# Patient Record
Sex: Male | Born: 1935 | Race: White | Hispanic: No | Marital: Married | Smoking: Former smoker
Health system: Southern US, Community
[De-identification: ages and names within clinical notes are randomized; demographics above are authoritative.]

## PROBLEM LIST (undated history)

## (undated) DIAGNOSIS — I499 Cardiac arrhythmia, unspecified: Secondary | ICD-10-CM

## (undated) DIAGNOSIS — I209 Angina pectoris, unspecified: Secondary | ICD-10-CM

## (undated) DIAGNOSIS — K222 Esophageal obstruction: Secondary | ICD-10-CM

## (undated) DIAGNOSIS — K602 Anal fissure, unspecified: Secondary | ICD-10-CM

## (undated) DIAGNOSIS — E785 Hyperlipidemia, unspecified: Secondary | ICD-10-CM

## (undated) DIAGNOSIS — E039 Hypothyroidism, unspecified: Secondary | ICD-10-CM

## (undated) DIAGNOSIS — R0600 Dyspnea, unspecified: Secondary | ICD-10-CM

## (undated) DIAGNOSIS — K279 Peptic ulcer, site unspecified, unspecified as acute or chronic, without hemorrhage or perforation: Secondary | ICD-10-CM

## (undated) DIAGNOSIS — C801 Malignant (primary) neoplasm, unspecified: Secondary | ICD-10-CM

## (undated) DIAGNOSIS — H409 Unspecified glaucoma: Secondary | ICD-10-CM

## (undated) DIAGNOSIS — D693 Immune thrombocytopenic purpura: Secondary | ICD-10-CM

## (undated) DIAGNOSIS — I10 Essential (primary) hypertension: Secondary | ICD-10-CM

## (undated) DIAGNOSIS — D869 Sarcoidosis, unspecified: Secondary | ICD-10-CM

## (undated) DIAGNOSIS — K219 Gastro-esophageal reflux disease without esophagitis: Secondary | ICD-10-CM

## (undated) DIAGNOSIS — H3530 Unspecified macular degeneration: Secondary | ICD-10-CM

## (undated) DIAGNOSIS — I509 Heart failure, unspecified: Secondary | ICD-10-CM

## (undated) DIAGNOSIS — I2510 Atherosclerotic heart disease of native coronary artery without angina pectoris: Secondary | ICD-10-CM

## (undated) DIAGNOSIS — K449 Diaphragmatic hernia without obstruction or gangrene: Secondary | ICD-10-CM

## (undated) DIAGNOSIS — N200 Calculus of kidney: Secondary | ICD-10-CM

## (undated) HISTORY — DX: Peptic ulcer, site unspecified, unspecified as acute or chronic, without hemorrhage or perforation: K27.9

## (undated) HISTORY — DX: Unspecified glaucoma: H40.9

## (undated) HISTORY — DX: Immune thrombocytopenic purpura: D69.3

## (undated) HISTORY — DX: Esophageal obstruction: K22.2

## (undated) HISTORY — PX: CORONARY ANGIOPLASTY WITH STENT PLACEMENT: SHX49

## (undated) HISTORY — DX: Anal fissure, unspecified: K60.2

## (undated) HISTORY — DX: Diaphragmatic hernia without obstruction or gangrene: K44.9

## (undated) HISTORY — PX: MEDIASTINOSCOPY: SUR861

## (undated) HISTORY — DX: Calculus of kidney: N20.0

## (undated) HISTORY — DX: Atherosclerotic heart disease of native coronary artery without angina pectoris: I25.10

## (undated) HISTORY — PX: OTHER SURGICAL HISTORY: SHX169

## (undated) HISTORY — DX: Sarcoidosis, unspecified: D86.9

---

## 1998-10-12 ENCOUNTER — Inpatient Hospital Stay (HOSPITAL_COMMUNITY): Admission: AD | Admit: 1998-10-12 | Discharge: 1998-10-15 | Payer: Self-pay | Admitting: Cardiovascular Disease

## 1998-10-12 ENCOUNTER — Encounter: Payer: Self-pay | Admitting: Cardiovascular Disease

## 1998-10-13 ENCOUNTER — Encounter: Payer: Self-pay | Admitting: Cardiovascular Disease

## 2000-05-06 DIAGNOSIS — D693 Immune thrombocytopenic purpura: Secondary | ICD-10-CM

## 2000-05-06 HISTORY — DX: Immune thrombocytopenic purpura: D69.3

## 2000-11-14 ENCOUNTER — Emergency Department (HOSPITAL_COMMUNITY): Admission: EM | Admit: 2000-11-14 | Discharge: 2000-11-15 | Payer: Self-pay | Admitting: Emergency Medicine

## 2000-11-14 ENCOUNTER — Encounter: Payer: Self-pay | Admitting: Emergency Medicine

## 2000-12-15 ENCOUNTER — Encounter: Payer: Self-pay | Admitting: Emergency Medicine

## 2000-12-15 ENCOUNTER — Inpatient Hospital Stay (HOSPITAL_COMMUNITY): Admission: EM | Admit: 2000-12-15 | Discharge: 2000-12-16 | Payer: Self-pay | Admitting: Emergency Medicine

## 2000-12-16 ENCOUNTER — Encounter: Payer: Self-pay | Admitting: *Deleted

## 2001-05-05 ENCOUNTER — Encounter (INDEPENDENT_AMBULATORY_CARE_PROVIDER_SITE_OTHER): Payer: Self-pay | Admitting: Specialist

## 2001-05-05 ENCOUNTER — Other Ambulatory Visit: Admission: RE | Admit: 2001-05-05 | Discharge: 2001-05-05 | Payer: Self-pay | Admitting: Oncology

## 2001-05-05 ENCOUNTER — Inpatient Hospital Stay (HOSPITAL_COMMUNITY): Admission: EM | Admit: 2001-05-05 | Discharge: 2001-05-07 | Payer: Self-pay | Admitting: Oncology

## 2001-05-06 ENCOUNTER — Encounter: Payer: Self-pay | Admitting: Oncology

## 2001-05-13 ENCOUNTER — Encounter (HOSPITAL_COMMUNITY): Admission: RE | Admit: 2001-05-13 | Discharge: 2001-06-12 | Payer: Self-pay | Admitting: Oncology

## 2001-05-13 ENCOUNTER — Encounter: Admission: RE | Admit: 2001-05-13 | Discharge: 2001-05-13 | Payer: Self-pay | Admitting: Oncology

## 2001-05-18 ENCOUNTER — Encounter (HOSPITAL_COMMUNITY): Payer: Self-pay | Admitting: Oncology

## 2001-06-15 ENCOUNTER — Encounter (HOSPITAL_COMMUNITY): Admission: RE | Admit: 2001-06-15 | Discharge: 2001-07-15 | Payer: Self-pay | Admitting: Oncology

## 2001-06-15 ENCOUNTER — Encounter: Admission: RE | Admit: 2001-06-15 | Discharge: 2001-06-15 | Payer: Self-pay | Admitting: Oncology

## 2001-07-15 ENCOUNTER — Encounter: Admission: RE | Admit: 2001-07-15 | Discharge: 2001-07-15 | Payer: Self-pay | Admitting: Oncology

## 2001-07-15 ENCOUNTER — Encounter (HOSPITAL_COMMUNITY): Admission: RE | Admit: 2001-07-15 | Discharge: 2001-08-14 | Payer: Self-pay | Admitting: Oncology

## 2001-08-26 ENCOUNTER — Encounter (HOSPITAL_COMMUNITY): Admission: RE | Admit: 2001-08-26 | Discharge: 2001-09-25 | Payer: Self-pay | Admitting: Oncology

## 2001-08-26 ENCOUNTER — Encounter: Admission: RE | Admit: 2001-08-26 | Discharge: 2001-08-26 | Payer: Self-pay | Admitting: Oncology

## 2001-09-30 ENCOUNTER — Encounter: Payer: Self-pay | Admitting: Family Medicine

## 2001-09-30 ENCOUNTER — Ambulatory Visit (HOSPITAL_COMMUNITY): Admission: RE | Admit: 2001-09-30 | Discharge: 2001-09-30 | Payer: Self-pay | Admitting: Family Medicine

## 2001-10-07 ENCOUNTER — Encounter: Admission: RE | Admit: 2001-10-07 | Discharge: 2001-10-07 | Payer: Self-pay | Admitting: Oncology

## 2001-10-07 ENCOUNTER — Encounter (HOSPITAL_COMMUNITY): Admission: RE | Admit: 2001-10-07 | Discharge: 2001-11-06 | Payer: Self-pay | Admitting: Oncology

## 2001-11-18 ENCOUNTER — Encounter: Admission: RE | Admit: 2001-11-18 | Discharge: 2001-11-18 | Payer: Self-pay | Admitting: Oncology

## 2001-11-18 ENCOUNTER — Encounter (HOSPITAL_COMMUNITY): Admission: RE | Admit: 2001-11-18 | Discharge: 2001-12-18 | Payer: Self-pay | Admitting: Oncology

## 2001-12-30 ENCOUNTER — Encounter (HOSPITAL_COMMUNITY): Admission: RE | Admit: 2001-12-30 | Discharge: 2002-01-29 | Payer: Self-pay | Admitting: Oncology

## 2001-12-30 ENCOUNTER — Encounter: Admission: RE | Admit: 2001-12-30 | Discharge: 2001-12-30 | Payer: Self-pay | Admitting: Oncology

## 2002-02-11 ENCOUNTER — Encounter (HOSPITAL_COMMUNITY): Admission: RE | Admit: 2002-02-11 | Discharge: 2002-03-13 | Payer: Self-pay | Admitting: Oncology

## 2002-03-15 ENCOUNTER — Encounter: Admission: RE | Admit: 2002-03-15 | Discharge: 2002-03-15 | Payer: Self-pay | Admitting: Oncology

## 2002-03-15 ENCOUNTER — Encounter (HOSPITAL_COMMUNITY): Admission: RE | Admit: 2002-03-15 | Discharge: 2002-04-14 | Payer: Self-pay | Admitting: Oncology

## 2002-03-24 ENCOUNTER — Other Ambulatory Visit: Admission: RE | Admit: 2002-03-24 | Discharge: 2002-03-24 | Payer: Self-pay | Admitting: Dermatology

## 2002-05-10 ENCOUNTER — Encounter (HOSPITAL_COMMUNITY): Admission: RE | Admit: 2002-05-10 | Discharge: 2002-06-09 | Payer: Self-pay | Admitting: Oncology

## 2002-05-10 ENCOUNTER — Encounter: Admission: RE | Admit: 2002-05-10 | Discharge: 2002-05-10 | Payer: Self-pay | Admitting: Oncology

## 2002-07-09 ENCOUNTER — Encounter: Admission: RE | Admit: 2002-07-09 | Discharge: 2002-07-09 | Payer: Self-pay | Admitting: Oncology

## 2002-07-09 ENCOUNTER — Encounter (HOSPITAL_COMMUNITY): Admission: RE | Admit: 2002-07-09 | Discharge: 2002-08-08 | Payer: Self-pay | Admitting: Oncology

## 2002-09-03 ENCOUNTER — Encounter (HOSPITAL_COMMUNITY): Admission: RE | Admit: 2002-09-03 | Discharge: 2002-10-03 | Payer: Self-pay | Admitting: Oncology

## 2002-09-03 ENCOUNTER — Encounter: Admission: RE | Admit: 2002-09-03 | Discharge: 2002-09-03 | Payer: Self-pay | Admitting: Oncology

## 2002-10-26 ENCOUNTER — Ambulatory Visit (HOSPITAL_COMMUNITY): Admission: RE | Admit: 2002-10-26 | Discharge: 2002-10-26 | Payer: Self-pay | Admitting: Family Medicine

## 2002-10-26 ENCOUNTER — Encounter: Payer: Self-pay | Admitting: Family Medicine

## 2002-10-29 ENCOUNTER — Encounter: Admission: RE | Admit: 2002-10-29 | Discharge: 2002-10-29 | Payer: Self-pay | Admitting: Oncology

## 2002-10-29 ENCOUNTER — Encounter (HOSPITAL_COMMUNITY): Admission: RE | Admit: 2002-10-29 | Discharge: 2002-11-28 | Payer: Self-pay | Admitting: Oncology

## 2002-11-15 ENCOUNTER — Encounter: Payer: Self-pay | Admitting: Internal Medicine

## 2002-11-15 ENCOUNTER — Ambulatory Visit (HOSPITAL_COMMUNITY): Admission: RE | Admit: 2002-11-15 | Discharge: 2002-11-15 | Payer: Self-pay | Admitting: Internal Medicine

## 2002-12-23 ENCOUNTER — Ambulatory Visit (HOSPITAL_COMMUNITY): Admission: RE | Admit: 2002-12-23 | Discharge: 2002-12-23 | Payer: Self-pay | Admitting: Family Medicine

## 2002-12-23 ENCOUNTER — Encounter: Payer: Self-pay | Admitting: Family Medicine

## 2003-02-22 ENCOUNTER — Encounter (HOSPITAL_COMMUNITY): Admission: RE | Admit: 2003-02-22 | Discharge: 2003-03-24 | Payer: Self-pay | Admitting: Oncology

## 2003-02-22 ENCOUNTER — Encounter: Admission: RE | Admit: 2003-02-22 | Discharge: 2003-02-22 | Payer: Self-pay | Admitting: Oncology

## 2003-04-06 ENCOUNTER — Ambulatory Visit (HOSPITAL_COMMUNITY): Admission: RE | Admit: 2003-04-06 | Discharge: 2003-04-06 | Payer: Self-pay | Admitting: Family Medicine

## 2003-05-20 ENCOUNTER — Ambulatory Visit (HOSPITAL_COMMUNITY): Admission: RE | Admit: 2003-05-20 | Discharge: 2003-05-20 | Payer: Self-pay | Admitting: Family Medicine

## 2003-05-25 ENCOUNTER — Encounter (HOSPITAL_COMMUNITY): Admission: RE | Admit: 2003-05-25 | Discharge: 2003-06-24 | Payer: Self-pay | Admitting: Oncology

## 2003-05-25 ENCOUNTER — Encounter: Admission: RE | Admit: 2003-05-25 | Discharge: 2003-05-25 | Payer: Self-pay | Admitting: Oncology

## 2003-06-02 ENCOUNTER — Ambulatory Visit (HOSPITAL_COMMUNITY): Admission: RE | Admit: 2003-06-02 | Discharge: 2003-06-02 | Payer: Self-pay | Admitting: Internal Medicine

## 2003-07-14 ENCOUNTER — Other Ambulatory Visit: Admission: RE | Admit: 2003-07-14 | Discharge: 2003-07-14 | Payer: Self-pay | Admitting: Dermatology

## 2003-11-23 ENCOUNTER — Encounter (HOSPITAL_COMMUNITY): Admission: RE | Admit: 2003-11-23 | Discharge: 2003-12-23 | Payer: Self-pay | Admitting: Oncology

## 2003-11-23 ENCOUNTER — Encounter: Admission: RE | Admit: 2003-11-23 | Discharge: 2003-11-23 | Payer: Self-pay | Admitting: Oncology

## 2004-04-23 ENCOUNTER — Ambulatory Visit (HOSPITAL_COMMUNITY): Admission: RE | Admit: 2004-04-23 | Discharge: 2004-04-23 | Payer: Self-pay | Admitting: Internal Medicine

## 2004-05-23 ENCOUNTER — Encounter (HOSPITAL_COMMUNITY): Admission: RE | Admit: 2004-05-23 | Discharge: 2004-06-22 | Payer: Self-pay | Admitting: Oncology

## 2004-05-23 ENCOUNTER — Encounter: Admission: RE | Admit: 2004-05-23 | Discharge: 2004-05-23 | Payer: Self-pay | Admitting: Oncology

## 2004-05-23 ENCOUNTER — Ambulatory Visit (HOSPITAL_COMMUNITY): Payer: Self-pay | Admitting: Oncology

## 2004-06-14 ENCOUNTER — Ambulatory Visit: Payer: Self-pay | Admitting: Internal Medicine

## 2004-06-19 ENCOUNTER — Ambulatory Visit (HOSPITAL_COMMUNITY): Admission: RE | Admit: 2004-06-19 | Discharge: 2004-06-19 | Payer: Self-pay | Admitting: Internal Medicine

## 2004-06-19 ENCOUNTER — Ambulatory Visit: Payer: Self-pay | Admitting: Internal Medicine

## 2004-06-19 HISTORY — PX: ESOPHAGOGASTRODUODENOSCOPY: SHX1529

## 2004-07-20 ENCOUNTER — Ambulatory Visit: Payer: Self-pay | Admitting: Internal Medicine

## 2004-08-20 ENCOUNTER — Ambulatory Visit: Payer: Self-pay | Admitting: Internal Medicine

## 2004-11-16 ENCOUNTER — Encounter (HOSPITAL_COMMUNITY): Admission: RE | Admit: 2004-11-16 | Discharge: 2004-12-16 | Payer: Self-pay | Admitting: Oncology

## 2004-11-16 ENCOUNTER — Ambulatory Visit (HOSPITAL_COMMUNITY): Payer: Self-pay | Admitting: Oncology

## 2004-11-16 ENCOUNTER — Encounter: Admission: RE | Admit: 2004-11-16 | Discharge: 2004-11-16 | Payer: Self-pay | Admitting: Oncology

## 2004-11-21 ENCOUNTER — Ambulatory Visit: Payer: Self-pay | Admitting: Internal Medicine

## 2005-02-18 ENCOUNTER — Ambulatory Visit: Payer: Self-pay | Admitting: Internal Medicine

## 2005-04-02 ENCOUNTER — Ambulatory Visit: Payer: Self-pay | Admitting: Internal Medicine

## 2005-05-13 ENCOUNTER — Other Ambulatory Visit: Admission: RE | Admit: 2005-05-13 | Discharge: 2005-05-13 | Payer: Self-pay | Admitting: Dermatology

## 2005-05-21 ENCOUNTER — Ambulatory Visit (HOSPITAL_COMMUNITY): Payer: Self-pay | Admitting: Oncology

## 2005-05-21 ENCOUNTER — Encounter: Admission: RE | Admit: 2005-05-21 | Discharge: 2005-05-21 | Payer: Self-pay | Admitting: Oncology

## 2005-05-21 ENCOUNTER — Encounter (HOSPITAL_COMMUNITY): Admission: RE | Admit: 2005-05-21 | Discharge: 2005-06-20 | Payer: Self-pay | Admitting: Oncology

## 2005-05-24 ENCOUNTER — Ambulatory Visit: Payer: Self-pay | Admitting: Internal Medicine

## 2005-07-29 ENCOUNTER — Ambulatory Visit: Payer: Self-pay | Admitting: Internal Medicine

## 2005-08-22 ENCOUNTER — Ambulatory Visit: Payer: Self-pay | Admitting: Internal Medicine

## 2005-11-19 ENCOUNTER — Encounter (HOSPITAL_COMMUNITY): Admission: RE | Admit: 2005-11-19 | Discharge: 2005-12-19 | Payer: Self-pay | Admitting: Oncology

## 2005-11-19 ENCOUNTER — Encounter: Admission: RE | Admit: 2005-11-19 | Discharge: 2005-11-19 | Payer: Self-pay | Admitting: Oncology

## 2005-11-19 ENCOUNTER — Ambulatory Visit (HOSPITAL_COMMUNITY): Payer: Self-pay | Admitting: Oncology

## 2005-12-10 ENCOUNTER — Ambulatory Visit: Payer: Self-pay | Admitting: Internal Medicine

## 2006-02-20 ENCOUNTER — Ambulatory Visit: Payer: Self-pay | Admitting: Internal Medicine

## 2006-04-18 ENCOUNTER — Ambulatory Visit: Payer: Self-pay | Admitting: Internal Medicine

## 2006-05-19 ENCOUNTER — Ambulatory Visit (HOSPITAL_COMMUNITY): Payer: Self-pay | Admitting: Oncology

## 2006-05-19 ENCOUNTER — Encounter (HOSPITAL_COMMUNITY): Admission: RE | Admit: 2006-05-19 | Discharge: 2006-06-18 | Payer: Self-pay | Admitting: Oncology

## 2006-08-19 ENCOUNTER — Ambulatory Visit: Payer: Self-pay | Admitting: Internal Medicine

## 2006-09-02 ENCOUNTER — Ambulatory Visit: Payer: Self-pay | Admitting: Internal Medicine

## 2006-10-24 ENCOUNTER — Ambulatory Visit: Payer: Self-pay | Admitting: Internal Medicine

## 2006-10-30 ENCOUNTER — Ambulatory Visit: Payer: Self-pay | Admitting: Internal Medicine

## 2006-10-30 ENCOUNTER — Ambulatory Visit (HOSPITAL_COMMUNITY): Admission: RE | Admit: 2006-10-30 | Discharge: 2006-10-30 | Payer: Self-pay | Admitting: Internal Medicine

## 2006-10-30 HISTORY — PX: COLONOSCOPY: SHX174

## 2006-10-30 HISTORY — PX: ESOPHAGOGASTRODUODENOSCOPY: SHX1529

## 2006-11-19 ENCOUNTER — Ambulatory Visit (HOSPITAL_COMMUNITY): Payer: Self-pay | Admitting: Oncology

## 2006-11-19 ENCOUNTER — Encounter (HOSPITAL_COMMUNITY): Admission: RE | Admit: 2006-11-19 | Discharge: 2006-12-19 | Payer: Self-pay | Admitting: Oncology

## 2006-12-23 ENCOUNTER — Ambulatory Visit: Payer: Self-pay | Admitting: Internal Medicine

## 2007-04-22 ENCOUNTER — Ambulatory Visit: Payer: Self-pay | Admitting: Internal Medicine

## 2007-05-18 ENCOUNTER — Ambulatory Visit (HOSPITAL_COMMUNITY): Payer: Self-pay | Admitting: Oncology

## 2007-05-18 ENCOUNTER — Encounter (HOSPITAL_COMMUNITY): Admission: RE | Admit: 2007-05-18 | Discharge: 2007-06-17 | Payer: Self-pay | Admitting: Oncology

## 2007-05-20 ENCOUNTER — Encounter: Payer: Self-pay | Admitting: Internal Medicine

## 2007-08-24 ENCOUNTER — Ambulatory Visit (HOSPITAL_COMMUNITY): Admission: RE | Admit: 2007-08-24 | Discharge: 2007-08-24 | Payer: Self-pay | Admitting: Family Medicine

## 2007-08-27 ENCOUNTER — Ambulatory Visit: Payer: Self-pay | Admitting: Internal Medicine

## 2007-08-27 DIAGNOSIS — I2510 Atherosclerotic heart disease of native coronary artery without angina pectoris: Secondary | ICD-10-CM | POA: Insufficient documentation

## 2007-08-27 DIAGNOSIS — D869 Sarcoidosis, unspecified: Secondary | ICD-10-CM | POA: Insufficient documentation

## 2007-08-27 DIAGNOSIS — J302 Other seasonal allergic rhinitis: Secondary | ICD-10-CM | POA: Insufficient documentation

## 2007-08-27 DIAGNOSIS — J4520 Mild intermittent asthma, uncomplicated: Secondary | ICD-10-CM | POA: Insufficient documentation

## 2007-08-27 DIAGNOSIS — J3089 Other allergic rhinitis: Secondary | ICD-10-CM

## 2007-09-14 ENCOUNTER — Ambulatory Visit: Payer: Self-pay | Admitting: Internal Medicine

## 2007-11-13 ENCOUNTER — Inpatient Hospital Stay (HOSPITAL_COMMUNITY): Admission: EM | Admit: 2007-11-13 | Discharge: 2007-11-19 | Payer: Self-pay | Admitting: Emergency Medicine

## 2007-11-25 ENCOUNTER — Ambulatory Visit (HOSPITAL_COMMUNITY): Admission: AD | Admit: 2007-11-25 | Discharge: 2007-11-26 | Payer: Self-pay | Admitting: Cardiology

## 2008-01-26 ENCOUNTER — Ambulatory Visit: Payer: Self-pay | Admitting: Internal Medicine

## 2008-02-26 ENCOUNTER — Ambulatory Visit: Payer: Self-pay | Admitting: Internal Medicine

## 2008-05-09 ENCOUNTER — Encounter (HOSPITAL_COMMUNITY): Admission: RE | Admit: 2008-05-09 | Discharge: 2008-06-08 | Payer: Self-pay | Admitting: Oncology

## 2008-05-20 ENCOUNTER — Ambulatory Visit (HOSPITAL_COMMUNITY): Payer: Self-pay | Admitting: Oncology

## 2008-05-31 ENCOUNTER — Ambulatory Visit: Payer: Self-pay | Admitting: Internal Medicine

## 2008-07-27 ENCOUNTER — Telehealth: Payer: Self-pay | Admitting: Internal Medicine

## 2008-08-24 ENCOUNTER — Ambulatory Visit: Payer: Self-pay | Admitting: Internal Medicine

## 2008-10-18 ENCOUNTER — Ambulatory Visit: Payer: Self-pay | Admitting: Internal Medicine

## 2009-01-18 DIAGNOSIS — I509 Heart failure, unspecified: Secondary | ICD-10-CM | POA: Insufficient documentation

## 2009-01-18 DIAGNOSIS — I10 Essential (primary) hypertension: Secondary | ICD-10-CM | POA: Insufficient documentation

## 2009-01-18 DIAGNOSIS — K222 Esophageal obstruction: Secondary | ICD-10-CM | POA: Insufficient documentation

## 2009-01-18 DIAGNOSIS — E7800 Pure hypercholesterolemia, unspecified: Secondary | ICD-10-CM | POA: Insufficient documentation

## 2009-01-18 DIAGNOSIS — E039 Hypothyroidism, unspecified: Secondary | ICD-10-CM | POA: Insufficient documentation

## 2009-01-18 DIAGNOSIS — H409 Unspecified glaucoma: Secondary | ICD-10-CM | POA: Insufficient documentation

## 2009-01-18 DIAGNOSIS — K602 Anal fissure, unspecified: Secondary | ICD-10-CM | POA: Insufficient documentation

## 2009-01-18 DIAGNOSIS — N200 Calculus of kidney: Secondary | ICD-10-CM | POA: Insufficient documentation

## 2009-01-18 DIAGNOSIS — Z8711 Personal history of peptic ulcer disease: Secondary | ICD-10-CM | POA: Insufficient documentation

## 2009-01-19 ENCOUNTER — Ambulatory Visit: Payer: Self-pay | Admitting: Internal Medicine

## 2009-01-19 DIAGNOSIS — K219 Gastro-esophageal reflux disease without esophagitis: Secondary | ICD-10-CM | POA: Insufficient documentation

## 2009-01-19 DIAGNOSIS — K921 Melena: Secondary | ICD-10-CM | POA: Insufficient documentation

## 2009-01-19 DIAGNOSIS — R1310 Dysphagia, unspecified: Secondary | ICD-10-CM | POA: Insufficient documentation

## 2009-01-20 ENCOUNTER — Ambulatory Visit (HOSPITAL_COMMUNITY): Admission: RE | Admit: 2009-01-20 | Discharge: 2009-01-20 | Payer: Self-pay | Admitting: Internal Medicine

## 2009-03-15 ENCOUNTER — Encounter: Payer: Self-pay | Admitting: Gastroenterology

## 2009-03-22 ENCOUNTER — Ambulatory Visit: Payer: Self-pay | Admitting: Internal Medicine

## 2009-04-04 ENCOUNTER — Ambulatory Visit (HOSPITAL_COMMUNITY): Admission: RE | Admit: 2009-04-04 | Discharge: 2009-04-04 | Payer: Self-pay | Admitting: Internal Medicine

## 2009-05-19 ENCOUNTER — Ambulatory Visit (HOSPITAL_COMMUNITY): Payer: Self-pay | Admitting: Oncology

## 2009-05-19 ENCOUNTER — Encounter (HOSPITAL_COMMUNITY): Admission: RE | Admit: 2009-05-19 | Discharge: 2009-06-18 | Payer: Self-pay | Admitting: Oncology

## 2009-08-01 ENCOUNTER — Ambulatory Visit: Payer: Self-pay | Admitting: Internal Medicine

## 2009-09-05 ENCOUNTER — Inpatient Hospital Stay (HOSPITAL_COMMUNITY): Admission: EM | Admit: 2009-09-05 | Discharge: 2009-09-06 | Payer: Self-pay | Admitting: Emergency Medicine

## 2009-09-21 ENCOUNTER — Ambulatory Visit: Payer: Self-pay | Admitting: Internal Medicine

## 2009-12-06 ENCOUNTER — Ambulatory Visit: Payer: Self-pay | Admitting: Internal Medicine

## 2010-05-02 ENCOUNTER — Ambulatory Visit: Payer: Self-pay | Admitting: Internal Medicine

## 2010-05-08 ENCOUNTER — Ambulatory Visit (HOSPITAL_COMMUNITY)
Admission: RE | Admit: 2010-05-08 | Discharge: 2010-06-05 | Payer: Self-pay | Source: Home / Self Care | Attending: Oncology | Admitting: Oncology

## 2010-05-08 ENCOUNTER — Encounter (HOSPITAL_COMMUNITY)
Admission: RE | Admit: 2010-05-08 | Discharge: 2010-06-05 | Payer: Self-pay | Source: Home / Self Care | Attending: Oncology | Admitting: Oncology

## 2010-05-11 ENCOUNTER — Ambulatory Visit (HOSPITAL_COMMUNITY)
Admission: RE | Admit: 2010-05-11 | Discharge: 2010-06-05 | Payer: Self-pay | Source: Home / Self Care | Attending: Oncology | Admitting: Oncology

## 2010-06-05 NOTE — Assessment & Plan Note (Signed)
Summary: sinus problem/ mbw   Primary Provider/Referring Provider:  McGough  CC:  Accute visit-sins drainage-sore throat x 2-3 days.  History of Present Illness: From 08/27/07-  Mr. Crystal returns for his follow-up of sarcoid and COPD.  He's been bothered by persistent low-grade bronchitis, coughing, brown sputum.  He says it won't come loose.  His primary doctor gave a Z-Pak and Singulair earlier this letter.  He's been coughing more over the last two weeks, but denies fever, or chest pain.  Prednisone helped in January, but caused a lot of trouble with thrush.  He's been taking Mucinex.  Otherwise, he is not worsened.  He denies fever, chest pain, or sore throat now.  He says a chest x-ray in [place] on Monday was " okay.".  02/26/08-  Complains of uncomfortable substernal rubbing sensation since stent placed in July. denies routine cough, fever, sweat, chest pain, palpitaiton or dysphagia. Got flu vax.  08/24/08- Sarcoid, allergic rhiniits, asthma 6 month f/u. CXR in Oct had shown stable chronic scarring.  Pollen gives some nasal burning and rhinorhea. Using breath right strips. Occ choke or strangle with meals. Noted easy bruising while working with chain saw recently.  Sep 21, 2009- Sarcoid, allergic rhinitis, asthma Hosp [facility] first of May with chest congestion . They adjusted meds, causing feet to swell. Now back to normal..Had f/u w/ cardiology. Now complains of drainage sensation in his throat for several years. Mucus is white spit. In last 2 days notes more cough, sore throat, no fever. No more dyspneic than usual. Continues allergy vaccine. Note he is on timoptic, without wheeze.     Current Medications (verified): 1)  Isosorbide Mononitrate Cr 60 Mg  Tb24 (Isosorbide Mononitrate) .... Take 1 1/2 Tabs By Mouth Once Daily 2)  Synthroid 100 Mcg  Tabs (Levothyroxine Sodium) .... Take 1 Tablet By Mouth Once A Day 3)  Allergy Injections 1:10 .... @ Dr.  Edison Simon 4)  Multivitamins    Tabs (Multiple Vitamin) .... Take 1 Tablet By Mouth Once A Day 5)  Finasteride 5 Mg  Tabs (Finasteride) .... Take 1 Tablet By Mouth Once A Day 6)  Zetia 10 Mg  Tabs (Ezetimibe) .... Take 1 Tablet By Mouth Once A Day 7)  Cozaar 100 Mg Tabs (Losartan Potassium) .... Take 1 By Mouth Once Daily 8)  Plavix 75 Mg Tabs (Clopidogrel Bisulfate) .... Take 1 By Mouth Once Daily 9)  Crestor 5 Mg Tabs (Rosuvastatin Calcium) .... Take 1 By Mouth Once Daily 10)  Toprol Xl 25 Mg Xr24h-Tab (Metoprolol Succinate) .... Take 1 By Mouth Once Daily 11)  Travatan 0.004 % Soln (Travoprost) .... Use As Directed 12)  Mucinex 600 Mg Xr12h-Tab (Guaifenesin) .... Two Times A Day 13)  Vitamin C 500 Mg Tabs (Ascorbic Acid) .... Take 1 By Mouth Once Daily 14)  Vitamin E 200 Unit Caps (Vitamin E) .... Take 1 By Mouth Once Daily 15)  Aspirin 81 Mg Tabs (Aspirin) .... Once Daily 16)  Triamcinolone Acetonide 0.1 % Crea (Triamcinolone Acetonide) .... As Needed 17)  Hydrochlorothiazide 12.5 Mg Caps (Hydrochlorothiazide) .... Take One Tablet Mon, Wed, Fri 18)  Timoptic 0.5 % Soln (Timolol Maleate) .Marland Kitchen.. 1 Drop in Both Eyes Every Morning 19)  Alprazolam 0.5 Mg Tabs (Alprazolam) .... Take 1/2 By Mouth At Bedtime 20)  Protonix 40 Mg Tbec (Pantoprazole Sodium) .... Take 1 By Mouth Once Daily 21)  Klor-Con 20 Meq Pack (Potassium Chloride) .... Take 1 By Mouth Once Daily 22)  Norvasc 5 Mg Tabs (  Amlodipine Besylate) .... Take 1 By Mouth Once Daily  Allergies (verified): 1)  ! Penicillin 2)  ! Sulfa 3)  ! Morphine 4)  ! Doxazosin Mesylate (Doxazosin Mesylate) 5)  ! * Tussin 6)  ! Robitussin Maximum Strength (Dextromethorphan Hbr) 7)  ! Lopressor 8)  ! Cardizem 9)  ! Anacin (Aspirin) 10)  ! Soma 11)  ! Cytotec (Misoprostol) 12)  ! Imdur 13)  ! Tylenol 14)  ! Dimetapp Cold/allergy (Brompheniramine-Phenylephrine) 15)  ! Dristan Cold (Chlorphen-Phenyleph-Apap) 16)  ! Tenormin 17)  ! Procardia  Past History:  Past Medical  History: Last updated: 02/26/2008 CORONARY ARTERY DISEASE (ICD-414.00) * IDIOPATHIC THROMBOCYTOPENIC PURPURA ALLERGIC ASTHMA (ICD-493.00) ALLERGIC RHINITIS (ICD-477.9) SARCOIDOSIS (ICD-135)    Past Surgical History: Last updated: 02/26/2008 Mediastinoscopy for dx sarcoid Cardiac stent  Family History: Last updated: 08/30/08 Father- died heart disease Mother- died CVA  Social History: Last updated: 2008-08-30 Patient never smoked.  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  The patient denies shortness of breath at rest, non-productive cough, coughing up blood, chest pain, irregular heartbeats, acid heartburn, indigestion, loss of appetite, weight change, abdominal pain, difficulty swallowing, sore throat, tooth/dental problems, nasal congestion/difficulty breathing through nose, and sneezing.    Vital Signs:  Patient profile:   77 year old male Height:      70 inches Weight:      153 pounds BMI:     22.03 O2 Sat:      99 % on Room air Pulse rate:   70 / minute BP sitting:   118 / 62  (left arm) Cuff size:   regular  Vitals Entered By: Reynaldo Minium CMA (Sep 21, 2009 9:39 AM)  O2 Flow:  Room air  Physical Exam  Additional Exam:  General: A/Ox3; pleasant and cooperative, NAD, SKIN: no rash, lesions NODES: no lymphadenopathy HEENT: [place]/AT, EOM- WNL, Conjuctivae- clear, PERRLA, TM-right retracted and paper-white area= foreign material or scar at inferior limb on right., Nose- clear, Throat- clear and wnl NECK: Supple w/ fair ROM, JVD- none, normal carotid impulses w/o bruits Thyroid-  CHEST: Clear to P&A,  HEART: RRR, no m/g/r heard ABDOMEN: Soft UUV:OZDG, nl pulses, no edema  NEURO: Grossly intact to observation      Impression & Recommendations:  Problem # 1:  SARCOIDOSIS, PULMONARY  (ICD-135) Clinically stable without complaint today.  Problem # 2:  ALLERGIC RHINITIS (ICD-477.9)  Complains of rhinitis with postnasal drip and symptoms of eustachian dysfunction. We will try neb nasal with depo.  Medications Added to Medication List This Visit: 1)  Timoptic 0.5 % Soln (Timolol maleate) .Marland Kitchen.. 1 drop in both eyes every morning 2)  Alprazolam 0.5 Mg Tabs (Alprazolam) .... Take 1/2 by mouth at bedtime 3)  Protonix 40 Mg Tbec (Pantoprazole sodium) .... Take 1 by mouth once daily 4)  Klor-con 20 Meq Pack (Potassium chloride) .... Take 1 by mouth once daily 5)  Norvasc 5 Mg Tabs (Amlodipine besylate) .... Take 1 by mouth once daily  Other Orders: Est. Patient Level III (64403) Admin of Therapeutic Inj  intramuscular or subcutaneous (47425) Depo- Medrol 80mg  (J1040) Nebulizer Tx (95638)  Patient Instructions: 1)  Please schedule a follow-up appointment in 6 months. 2)  neb nasal neo  3)  depo 80     Medication Administration  Injection # 1:    Medication: Depo- Medrol 80mg     Diagnosis: ALLERGIC RHINITIS (ICD-477.9)    Route: IM    Site: RUOQ gluteus    Exp Date: 03/18/2012    Lot #: [id number]    Mfr: Pharmacia    Patient tolerated injection without complications    Given by: Abigail Miyamoto RN (Sep 21, 2009 10:47 AM)  Medication # 1:    Medication: EMR miscellaneous medications    Diagnosis: ALLERGIC RHINITIS (ICD-477.9)    Dose: 3 drops    Route: intranasal    Exp Date: 02/14/2011    Lot #: [id number]    Mfr:  Ramond Dial    Comments: Neosynephrine     Patient tolerated medication without complications    Given by: Abigail Miyamoto RN (Sep 21, 2009 10:50 AM)  Orders Added: 1)  Est. Patient Level III [02542] 2)  Admin of Therapeutic Inj  intramuscular or subcutaneous [96372] 3)  Depo- Medrol 80mg  [J1040] 4)  Nebulizer Tx [70623]

## 2010-06-21 ENCOUNTER — Encounter: Payer: Self-pay | Admitting: Gastroenterology

## 2010-06-21 ENCOUNTER — Ambulatory Visit (INDEPENDENT_AMBULATORY_CARE_PROVIDER_SITE_OTHER): Payer: Medicare Other | Admitting: Gastroenterology

## 2010-06-21 DIAGNOSIS — R197 Diarrhea, unspecified: Secondary | ICD-10-CM

## 2010-06-26 ENCOUNTER — Encounter: Payer: Medicare Other | Admitting: Internal Medicine

## 2010-06-28 ENCOUNTER — Encounter: Payer: Self-pay | Admitting: Internal Medicine

## 2010-06-28 ENCOUNTER — Encounter (INDEPENDENT_AMBULATORY_CARE_PROVIDER_SITE_OTHER): Payer: Medicare Other | Admitting: Internal Medicine

## 2010-06-28 DIAGNOSIS — R109 Unspecified abdominal pain: Secondary | ICD-10-CM

## 2010-07-03 NOTE — Assessment & Plan Note (Signed)
Summary: ABD PAIN,DIARRHEA   Vital Signs:  Patient profile:   77 year old male Height:      70 inches Weight:      147.50 pounds BMI:     21.24 Temp:     97.8 degrees F oral Pulse rate:   68 / minute BP sitting:   158 / 78  (left arm) Cuff size:   large  Vitals Entered By: Cloria Spring LPN (June 21, 2010 2:10 PM)  Visit Type:  Follow-up Consult Primary Care Provider:  McGough  CC:  abdominal pain/diarrhea.  History of Present Illness: Dale [name] is a pleasant 77 year old male who presents at the request of Dr. Sherwood Gambler secondary to bowel habits. Pt denies diarrhea. He states he had one episode of loose stool about 2 weeks ago, self-limiting, small amount of abdominal discomfort. All resolved now. Reports a BM usually every day, sometimes 2-3X/day. Soft formed. no brbpr. He states stool was "black" one time. No other incidences. No abdominal pain. Denies nausea. No loss of appetite. No change in weight. No recent labs.  Last EGD/ED and TCS was in June 2008: showed esophageal ring s/p dilatation and nl colon.   Current Medications (verified): 1)  Isosorbide Mononitrate Cr 60 Mg  Tb24 (Isosorbide Mononitrate) .... Take 1 Tablet By Mouth Once A Day 2)  Synthroid 88 Mcg  Tabs (Levothyroxine Sodium) .... Take 1 Tablet By Mouth Once A Day 3)  Allergy Injections 1:10 .... @ Dr.  Edison Simon 4)  Multivitamins   Tabs (Multiple Vitamin) .... Take 1 Tablet By Mouth Once A Day 5)  Finasteride 5 Mg  Tabs (Finasteride) .... Take 1 Tablet By Mouth Once A Day 6)  Zetia 10 Mg  Tabs (Ezetimibe) .... Take 1 Tablet By Mouth Once A Day 7)  Cozaar 100 Mg Tabs (Losartan Potassium) .... Take 1 By Mouth Once Daily 8)  Plavix 75 Mg Tabs (Clopidogrel Bisulfate) .... Take 1 By Mouth Once Daily 9)  Crestor 5 Mg Tabs (Rosuvastatin Calcium) .... Take 1 By Mouth Once Daily 10)  Toprol Xl 25 Mg Xr24h-Tab (Metoprolol Succinate) .... Take One Half Tablet Twice Daily 11)  Travatan 0.004 % Soln (Travoprost) .... Use As  Directed 12)  Mucinex 600 Mg Xr12h-Tab (Guaifenesin) .... One Tablet Daily 13)  Vitamin C 500 Mg Tabs (Ascorbic Acid) .... Take 1 By Mouth Once Daily 14)  Vitamin E 200 Unit Caps (Vitamin E) .... Take 1 By Mouth Once Daily 15)  Aspirin 81 Mg Tabs (Aspirin) .... Once Daily 16)  Triamcinolone Acetonide 0.1 % Crea (Triamcinolone Acetonide) .... As Needed 17)  Timoptic 0.5 % Soln (Timolol Maleate) .Marland Kitchen.. 1 Drop in Both Eyes Every Morning 18)  Protonix 40 Mg Tbec (Pantoprazole Sodium) .... Take 1 By Mouth Once Daily 19)  Dorzolamide Hcl 2 % Soln (Dorzolamide Hcl) .... As Directed 20)  Preservision/lutein  Caps (Multiple Vitamins-Minerals) .... Daily  Allergies (verified): 1)  ! Penicillin 2)  ! Sulfa 3)  ! Morphine 4)  ! Doxazosin Mesylate (Doxazosin Mesylate) 5)  ! * Tussin 6)  ! Robitussin Maximum Strength (Dextromethorphan Hbr) 7)  ! Lopressor 8)  ! Cardizem 9)  ! Anacin (Aspirin) 10)  ! Soma 11)  ! Cytotec (Misoprostol) 12)  ! Imdur 13)  ! Tylenol 14)  ! Dimetapp Cold/allergy (Brompheniramine-Phenylephrine) 15)  ! Dristan Cold (Chlorphen-Pe-Acetaminophen) 16)  ! Tenormin 17)  ! Procardia  Past History:  Past Medical History: Last updated: 02/26/2008 CORONARY ARTERY DISEASE (ICD-414.00) * IDIOPATHIC THROMBOCYTOPENIC PURPURA ALLERGIC ASTHMA (  ICD-493.00) ALLERGIC RHINITIS (ICD-477.9) SARCOIDOSIS (ICD-135)    Past Surgical History: Last updated: 02/26/2008 Mediastinoscopy for dx sarcoid Cardiac stent  Family History: Last updated: 2008/09/18 Father- died heart disease Mother- died CVA  Social History: Last updated: 18-Sep-2008 Patient never smoked.  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 Eyes:  Denies blurring, irritation, and discharge. ENT:  Denies sore throat, hoarseness, and difficulty swallowing. CV:  Denies chest pains and syncope. Resp:  Denies dyspnea at rest and wheezing. GI:  See  HPI. GU:  Denies urinary burning and urinary frequency. MS:  Denies joint pain / LOM, joint swelling, and joint stiffness. Derm:  Denies rash, itching, and dry skin. Neuro:  Denies weakness and syncope. Psych:  Denies depression and anxiety. Endo:  Denies cold intolerance and heat intolerance.  Physical Exam  General:  thin but healthy-appearing Head:  Normocephalic and atraumatic. Eyes:  without icterus, sclera clear Lungs:  Clear throughout to auscultation. Heart:  Regular rate and rhythm; no murmurs, rubs,  or bruits. Abdomen:  +BS, soft, non-tender, non-distended. no HSM. No rebound or guarding, no masses noted.  Msk:  Symmetrical with no gross deformities. Normal posture. Extremities:  No clubbing, cyanosis, edema or deformities noted. Neurologic:  Alert and  oriented x4;  grossly normal neurologically.   Impression & Recommendations:  Problem # 1:  DIARRHEA (ICD-44.49) 77 year old pleasant Caucasian male with hx of 1 episode of loose stool approximately 2 weeks ago. None since. BM 2-3X per day, soft. No abdominal pain, N/V. No change in appetite or wt loss. Last TCS in 2008 normal. Does state has noted incidence of "black" stool. No recent labs on pt. Appears diarrhea may have been isolated incidence. ?melena?  Stool studies if diarrhea returns, otherwise no change in plan CBC Further rec's to follow.    Orders: T-CBC w/Diff ([id number]) Consultation Level III [phone])   Orders Added: 1)  T-CBC w/Diff [[id number]] 2)  Consultation Level III [13086]

## 2010-07-03 NOTE — Assessment & Plan Note (Signed)
Summary: dropped off stool  pt returned ifobt and it was negative  Allergies: 1)  ! Penicillin 2)  ! Sulfa 3)  ! Morphine 4)  ! Doxazosin Mesylate (Doxazosin Mesylate) 5)  ! * Tussin 6)  ! Robitussin Maximum Strength (Dextromethorphan Hbr) 7)  ! Lopressor 8)  ! Cardizem 9)  ! Anacin (Aspirin) 10)  ! Soma 11)  ! Cytotec (Misoprostol) 12)  ! Imdur 13)  ! Tylenol 14)  ! Dimetapp Cold/allergy (Brompheniramine-Phenylephrine) 15)  ! Dristan Cold (Chlorphen-Pe-Acetaminophen) 16)  ! Tenormin 17)  ! Procardia   Other Orders: Immuno-chemical Fecal Occult (04540)   Orders Added: 1)  Immuno-chemical Fecal Occult [[id number]]

## 2010-07-16 LAB — CBC
HCT: 37.1 % — ABNORMAL LOW (ref 39.0–52.0)
Hemoglobin: 12.6 g/dL — ABNORMAL LOW (ref 13.0–17.0)
MCH: 32.8 pg (ref 26.0–34.0)
MCHC: 34 g/dL (ref 30.0–36.0)
MCV: 96.6 fL (ref 78.0–100.0)
Platelets: 199 10*3/uL (ref 150–400)
RBC: 3.84 MIL/uL — ABNORMAL LOW (ref 4.22–5.81)
RDW: 13.6 % (ref 11.5–15.5)
WBC: 6 10*3/uL (ref 4.0–10.5)

## 2010-07-20 LAB — CONVERTED CEMR LAB
Basophils Absolute: 0 10*3/uL (ref 0.0–0.1)
Basophils Relative: 1 % (ref 0–1)
Eosinophils Absolute: 0.3 10*3/uL (ref 0.0–0.7)
Eosinophils Relative: 4 % (ref 0–5)
HCT: 40.2 % (ref 39.0–52.0)
Hemoglobin: 12.8 g/dL — ABNORMAL LOW (ref 13.0–17.0)
Lymphocytes Relative: 17 % (ref 12–46)
Lymphs Abs: 1.1 10*3/uL (ref 0.7–4.0)
MCHC: 31.8 g/dL (ref 30.0–36.0)
MCV: 100.2 fL — ABNORMAL HIGH (ref 78.0–100.0)
Monocytes Absolute: 1 10*3/uL (ref 0.1–1.0)
Monocytes Relative: 15 % — ABNORMAL HIGH (ref 3–12)
Neutro Abs: 4 10*3/uL (ref 1.7–7.7)
Neutrophils Relative %: 63 % (ref 43–77)
Platelets: 251 10*3/uL (ref 150–400)
RBC: 4.01 M/uL — ABNORMAL LOW (ref 4.22–5.81)
RDW: 14.2 % (ref 11.5–15.5)
WBC: 6.4 10*3/uL (ref 4.0–10.5)

## 2010-07-21 LAB — CBC
HCT: 38.6 % — ABNORMAL LOW (ref 39.0–52.0)
Hemoglobin: 13.1 g/dL (ref 13.0–17.0)
MCHC: 34 g/dL (ref 30.0–36.0)
MCV: 101.3 fL — ABNORMAL HIGH (ref 78.0–100.0)
Platelets: 230 10*3/uL (ref 150–400)
RBC: 3.81 MIL/uL — ABNORMAL LOW (ref 4.22–5.81)
RDW: 13.9 % (ref 11.5–15.5)
WBC: 6.8 10*3/uL (ref 4.0–10.5)

## 2010-07-24 LAB — DIFFERENTIAL
Basophils Absolute: 0 10*3/uL (ref 0.0–0.1)
Basophils Relative: 1 % (ref 0–1)
Eosinophils Absolute: 0.2 10*3/uL (ref 0.0–0.7)
Eosinophils Relative: 3 % (ref 0–5)
Lymphocytes Relative: 13 % (ref 12–46)
Lymphs Abs: 0.9 10*3/uL (ref 0.7–4.0)
Monocytes Absolute: 0.9 10*3/uL (ref 0.1–1.0)
Monocytes Relative: 12 % (ref 3–12)
Neutro Abs: 5.1 10*3/uL (ref 1.7–7.7)
Neutrophils Relative %: 71 % (ref 43–77)

## 2010-07-24 LAB — PROTIME-INR
INR: 1.07 (ref 0.00–1.49)
Prothrombin Time: 13.8 seconds (ref 11.6–15.2)

## 2010-07-24 LAB — TSH: TSH: 0.653 u[IU]/mL (ref 0.350–4.500)

## 2010-07-24 LAB — URINALYSIS, MICROSCOPIC ONLY
Bilirubin Urine: NEGATIVE
Hgb urine dipstick: NEGATIVE
Ketones, ur: NEGATIVE mg/dL
Nitrite: NEGATIVE
Specific Gravity, Urine: 1.011 (ref 1.005–1.030)
Urobilinogen, UA: 0.2 mg/dL (ref 0.0–1.0)

## 2010-07-24 LAB — LIPID PANEL
Cholesterol: 118 mg/dL (ref 0–200)
HDL: 41 mg/dL (ref >39)
Triglycerides: 57 mg/dL (ref <150)

## 2010-07-24 LAB — POCT CARDIAC MARKERS
CKMB, poc: 1.4 ng/mL (ref 1.0–8.0)
Myoglobin, poc: 79.6 ng/mL (ref 12–200)
Troponin i, poc: <0.05 ng/mL (ref 0.00–0.09)

## 2010-07-24 LAB — CBC
HCT: 37.5 % — ABNORMAL LOW (ref 39.0–52.0)
Hemoglobin: 13.2 g/dL (ref 13.0–17.0)
MCHC: 35.2 g/dL (ref 30.0–36.0)
MCV: 104.2 fL — ABNORMAL HIGH (ref 78.0–100.0)
Platelets: 220 10*3/uL (ref 150–400)
RBC: 3.6 MIL/uL — ABNORMAL LOW (ref 4.22–5.81)
RDW: 13 % (ref 11.5–15.5)
WBC: 7.2 10*3/uL (ref 4.0–10.5)

## 2010-07-24 LAB — APTT
aPTT: 29 seconds (ref 24–37)
aPTT: 31 seconds (ref 24–37)

## 2010-07-24 LAB — POCT I-STAT, CHEM 8
BUN: 12 mg/dL (ref 6–23)
Calcium, Ion: 1.22 mmol/L (ref 1.12–1.32)
Chloride: 103 mEq/L (ref 96–112)
Creatinine, Ser: 0.8 mg/dL (ref 0.4–1.5)
Glucose, Bld: 99 mg/dL (ref 70–99)
HCT: 40 % (ref 39.0–52.0)
Hemoglobin: 13.6 g/dL (ref 13.0–17.0)
Potassium: 4.2 mEq/L (ref 3.5–5.1)
Sodium: 138 mEq/L (ref 135–145)
TCO2: 29 mmol/L (ref 0–100)

## 2010-07-24 LAB — COMPREHENSIVE METABOLIC PANEL
BUN: 10 mg/dL (ref 6–23)
CO2: 31 mEq/L (ref 19–32)
Calcium: 9.1 mg/dL (ref 8.4–10.5)
Chloride: 104 mEq/L (ref 96–112)
Creatinine, Ser: 0.74 mg/dL (ref 0.4–1.5)
GFR calc non Af Amer: >60 mL/min (ref >60)
Glucose, Bld: 105 mg/dL — ABNORMAL HIGH (ref 70–99)
Total Bilirubin: 0.9 mg/dL (ref 0.3–1.2)

## 2010-07-24 LAB — CARDIAC PANEL(CRET KIN+CKTOT+MB+TROPI)
CK, MB: 2.9 ng/mL (ref 0.3–4.0)
Total CK: 89 U/L (ref 7–232)
Troponin I: 0.02 ng/mL (ref 0.00–0.06)

## 2010-07-24 LAB — MAGNESIUM: Magnesium: 2.3 mg/dL (ref 1.5–2.5)

## 2010-07-24 LAB — MRSA PCR SCREENING: MRSA by PCR: NEGATIVE

## 2010-07-24 LAB — CK TOTAL AND CKMB (NOT AT ARMC): Relative Index: INVALID (ref 0.0–2.5)

## 2010-08-20 LAB — DIFFERENTIAL
Band Neutrophils: 0 % (ref 0–10)
Blasts: 0 %
Metamyelocytes Relative: 0 %
Monocytes Absolute: 0.5 10*3/uL (ref 0.1–1.0)
Myelocytes: 0 %
Promyelocytes Absolute: 0 %
nRBC: 0 /100 WBC

## 2010-08-20 LAB — CBC
HCT: 37.7 % — ABNORMAL LOW (ref 39.0–52.0)
HCT: 42.9 % (ref 39.0–52.0)
Hemoglobin: 12.5 g/dL — ABNORMAL LOW (ref 13.0–17.0)
Hemoglobin: 14.4 g/dL (ref 13.0–17.0)
MCHC: 33.2 g/dL (ref 30.0–36.0)
MCV: 100.1 fL — ABNORMAL HIGH (ref 78.0–100.0)
MCV: 98.7 fL (ref 78.0–100.0)
Platelets: 209 10*3/uL (ref 150–400)
RDW: 14.8 % (ref 11.5–15.5)
WBC: 6.4 10*3/uL (ref 4.0–10.5)

## 2010-08-20 LAB — FERRITIN: Ferritin: 37 ng/mL (ref 22–322)

## 2010-08-20 LAB — IRON AND TIBC
Iron: 107 ug/dL (ref 42–135)
Saturation Ratios: 32 % (ref 20–55)
TIBC: 333 ug/dL (ref 215–435)
UIBC: 226 ug/dL

## 2010-08-28 ENCOUNTER — Ambulatory Visit (INDEPENDENT_AMBULATORY_CARE_PROVIDER_SITE_OTHER): Payer: Medicare Other

## 2010-08-28 DIAGNOSIS — J309 Allergic rhinitis, unspecified: Secondary | ICD-10-CM

## 2010-09-18 NOTE — H&P (Signed)
NAME:  Dale [name], Dale [name]                ACCOUNT NO.:  [id number]   MEDICAL RECORD NO.:  [id number]          PATIENT TYPE:  AMB   LOCATION:  DAY                           FACILITY:  [facility]   PHYSICIAN:  R. Roetta Sessions, M.D. DATE OF BIRTH:  26-Feb-1929   DATE OF ADMISSION:  DATE OF DISCHARGE:  LH                              HISTORY & PHYSICAL   REASON FOR CONSULTATION:  Esophageal dysphagia to solids.   HISTORY OF PRESENT ILLNESS:  Dale [name] is a very pleasant 78-year-  old Caucasian male who was sent in by referral from Dr. Patrica Duel  for further evaluation of solid food hanging behind his breastbone.  This gentleman gives a long history of recurrent esophageal dysphagia.  He has a known Schatzki's ring which has been dilated multiple times in  the past.  Last EGD with dilation was on June 19, 2004.  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  He did well until the past  several months.  He was on Pepcid 20 mg at bedtime for a time and then  more recently started on omeprazole but he stopped it stating it made  him feel funny.  Has not had any melena or rectal bleeding.  He had a  sigmoidoscopy in the past.  He apparently attempted full colonoscopy  with Dr. Marvis Moeller which was unsuccessful because he could not make it  around to the cecum.  Mr. Kawecki describes undergoing an air contrast  barium enema at [facility] and may describe and says he  had polyps on the outside (query diverticula).  I do not have a copy  of that study for review at this time.  There is no family history of  colorectal neoplasia.  He has not lost any weight.  There is no  odynophagia, no vomiting.  Really no abdominal pain.   PAST MEDICAL HISTORY:  Significant for hypertension, glaucoma, allergic  rhinitis, hypothyroidism, congestive heart  failure,  hypercholesterolemia, coronary artery disease status post multiple  catheterizations with a stent placed in the right coronary artery,  distant history of peptic ulcer disease some three decades ago, history  of pulmonary sarcoidosis, nephrolithiasis, anal fissure, idiopathic  thrombocytopenic purpura in 2002 requiring hospitalization.   CURRENT MEDICATIONS:  Finasteride 5 mg daily.  Synthroid 100 mcg daily.  Hyzaar 100/12.5 daily.  Isosorbide mononitrate ER 60 mg 1-1/2 tablets daily.  ASA 81 mg daily.  Famotidine 10 mg daily.  Vitamin C and E daily.  Zetia 10 mg daily.  Crestor 5 mg daily.  Verelan 200 mg daily.  Docusate sodium 2 daily p.r.n. constipation.  Nasacort.  Optivar eye drops.  Allergy shots.  Triamcinolone cream.  Fanny cream b.i.d.   ALLERGIES:  LOPRESSOR, CARDIZEM, PENICILLIN, SULFA, __________  MORPHINE, SOMA, TUSSIONEX, CYTOTEC, ROBITUSSIN, DOXYCIN, IMDUR, TYLENOL,  DIMETAPP, DRISTAN, TENORMIN, PROCARDIA.   FAMILY HISTORY:  Mother died age 67 with Alzheimer's.  Father died at  age  70 with heart trouble.  No history of chronic GI or liver illness.  One brother did die of an MI at age 15.   SOCIAL HISTORY:  The patient is married.  He has one child.  Retired  from [employer].  He has never been a smoker or alcohol user.  No illicit drugs.   REVIEW OF SYSTEMS:  No chest pain, dyspnea on exertion, no fever or  chills, no change in weight.   PHYSICAL EXAMINATION:  A pleasant 77 year old gentleman resting  comfortably.  No jaundice.  HEENT examination:  Scleral icterus,  conjunctivae are pink.  Oropharynx no lesions.  Chest:  Lungs are clear  to auscultation.  Cardiac exam:  Regular rate and rhythm without murmur,  gallop or rub.  Abdomen nondistended.  Positive bowel sounds, soft,  nontender without appreciable mass or organomegaly.  Extremity exam:  No  edema.  Rectal exam:  Deferred at the time of colonoscopy.   IMPRESSION:  Dale [name] is  a very pleasant 77 year old gentleman with  recurrent esophageal dysphagia in a known setting of tandem esophageal  rings.  His rings slightly tightened up.  He would be best served by  going directly to EGD with dilation as appropriate.  I note he has  really never had optical colonoscopy, although he had a sigmoidoscopy  previously and may have had a barium enema within the past couple of  years.  I told him when we perform upper endoscopy I want to go ahead  and make our best attempt to do a complete colonoscopy for screening  purposes.  The procedure was discussed with the patient at length.  Potential risks, benefits and alternatives have been reviewed.  We will  plan to utilize the pediatric colonoscope.  His questions were answered.  He is agreeable.  We will make further recommendations in the very near  future.   I would like to thank Dr. Patrica Duel for letting me see this nice  gentleman today.      Jonathon Bellows, M.D.  Electronically Signed     RMR/MEDQ  D:  10/24/2006  T:  10/24/2006  Job:  [id number]   cc:   Patrica Duel, M.D.  Fax: [phone]

## 2010-09-18 NOTE — Discharge Summary (Signed)
NAME:  Dale [name], SHIN NO.:  [id number]   MEDICAL RECORD NO.:  [id number]          PATIENT TYPE:  INP   LOCATION:  6527                         FACILITY:  [facility]   PHYSICIAN:  Nicki Guadalajara, M.D.     DATE OF BIRTH:  Jun 01, 1928   DATE OF ADMISSION:  11/13/2007  DATE OF DISCHARGE:  11/19/2007                               DISCHARGE SUMMARY   DISCHARGE DIAGNOSES:  1. Unstable angina.  2. Coronary artery disease with history of stent to the right coronary      artery in 2000, now with Cypher stent to the right coronary artery      distal to the previous stent and patent previous stent.  3. Hypertension, controlled.  During this admission somewhat      hypotensive.  4. Bradycardia causing nausea and vomiting, resolved with decreasing      beta-blocker and verapamil.  5. Dyslipidemia.  6. Pulmonary sarcoidosis.  7. History of asthma.  8. Hyponatremia.   DISCHARGE CONDITION:  Improved.   PROCEDURES:  1. Combined left heart cath by Dr. Nicki Guadalajara on November 16, 2007.  2. Percutaneous transluminal coronary angioplasty and stent deployment      showed an 85% stenosis distal to the previous right coronary artery      stent.      a.     Nonobstructive coronary disease of proximal right coronary       artery to left anterior descending and diagonal as well as small       area in the left circumflex.  3. Normal left ventricular function by cardiac catheterization.   DISCHARGE MEDICATIONS:  1. Crestor 5 mg 1 every evening.  2. Travatan eyedrops 0.004% 1 drop each eye daily.  3. GenTeal lubricant drops 1 drop in each eye daily.  4. Boric acid/zinc oxide cream every 2 days to affected areas.  5. Nitroglycerin sublingual as needed for chest tightness.  6. Finasteride 5 mg one every morning.  7. Levothyroxine 100 mcg daily.  8. Hyzaar, please stop.  We have changed this to Cozaar 100 mg daily.  9. Enteric-coated aspirin; take either enteric-coated 325 once a day      or 2  uncoated baby aspirin daily.  10.Stop atenolol.  This was changed to Lopressor 12.5 mg twice a day.  11.Verapamil was changed to 120 mg daily.  12.Plavix 75 mg one daily; did not stop, stopping because of heart      attack.   DISCHARGE INSTRUCTIONS:  1. Low-sodium, heart-healthy diet.  2. Increase activity slowly.  May shower or bathe.  No lifting for 4      days.  3. To wash right groin cath site with soap and water.  Call if any      bleeding, swelling, or drainage.  4. Follow up with Dr. Landry Dyke PA on November 25, 2007, at 2:30 p.m. for      heart rate evaluation and blood pressure evaluation.  5. Have lab work done on Monday morning to evaluate sodium level.   HISTORY OF PRESENT ILLNESS:  A 77 year old white married male with  history of no coronary disease with stent to the RCA in 2000,  hypertension, hypercholesterol, hypothyroid, pulmonary sarcoid, allergic  asthma, and idiopathic thrombocytopenia in 2002, who had been seen in  the office in March for left-sided chest pain, was resolved.  He  presented to the emergency room on November 13, 2007, after 2 weeks of  substernal chest pressure, intermittent, noted mostly with exertion, but  also at rest.  After eating dinner on November 13, 2007, he had the worst  bowel relieved with 2 sublingual nitro.  No associated symptoms.  No  radiation, but he had been short of breath on exertion for 2 weeks.  He  did not recall symptoms prior to stent placement in 2000.  He was then  admitted and placed on Lovenox and nitro and plans were for cardiac  catheterization.   PAST MEDICAL HISTORY:  As previously stated.   FAMILY HISTORY/SOCIAL HISTORY AND REVIEW OF SYSTEMS:  See H and P.   PHYSICAL EXAMINATION AT DISCHARGE:  VITAL SIGNS:  Blood pressure 117/50,  pulse 73, respiratory 18, temp 97.8, and oxygen saturation on room air  97%.  Right groin cath site was stable.  I did have a tape burn with a  cherry-red spot.  LUNGS:  Sounds were stable.    LABORATORY DATA:  Admitting hemoglobin 12.3, hematocrit 35.8, WBC 5.2,  platelets 186, MCV 100.8, and neutrophils 59.  These remained stable  throughout hospitalization.  At discharge; hemoglobin 11.4, hematocrit  33.1, WBC 6.2, and platelets 179.  Chemistry; on admission sodium 136,  potassium 4.1, chloride 102, and CO2 of 28.  BUN 17, creatinine 0.82,  and glucose 145.  On November 17, 2007, his sodium had dropped to 133 and by  discharge sodium was 126 with that potassium 4.3, and chloride 91.  Sodium 28, BUN 11, creatinine 0.85, and glucose 100.  At this point, we  stopped his hydrochlorothiazide and will follow up as an outpatient.   Protime 13.8, INR 4, and PTT 43.  On admission, AST 23, ALT 23, alkaline  phos 48, and total bili 0.5.  Follow up ALT was 117, alkaline phos 53,  total bili 0.6, amylase 96, and lipase 52.  This was done secondary to  nausea and vomiting.  Cardiac markers were all negative.  CKs ranged in  the 60s to 77, MBs 2.3 and 3.3, and troponin-I is 0.01-0.04.   Total cholesterol 104, LDL 57, HDL 33, and triglycerides 68.  Calcium  8.9 and magnesium 2.3.  BNP on admission was 125.  TSH 0.67.  Radiology;  chest x-ray, no acute chest findings.   Cardiac cath as previously described.  Please see dictated note.   HOSPITAL COURSE:  [name] was admitted with unstable angina, placed on  IV nitro and Lovenox stabilized.  Cardiac enzymes were negative.  He  then underwent cardiac catheterization revealing new stenosis in the RCA  distal to his previous stent to the RCA.  He received Cypher drug-  eluting stent to this area, tolerated procedure well.  Please note, the  night before the procedure, he did have heart rate dropped down to 38 on  sinus bradycardia.  His beta-blocker was changed from atenolol to  Lopressor at that time.   By November 17, 2007, he was complaining of nausea and significant retching  with drops in heart rate.  He was unsure initially if the nausea  came  first and then the bradycardia due to vasovagal or vice versa.  We did  amylase and lipase, which were normal.  His AST was slightly elevated at  117, but we had when he walked in the hall his heart rate also dropped  first and then the patient became nauseated, so at that point  medications were adjusted.  We are going to actually stop the verapamil.  He improved probably because we had already cut back on Lopressor is one  and half twice a day.  But, on discharge we decreased the verapamil to  120 mg once a day until now he is changed to Lopressor 12.5 twice a day  and we also stopped his HCTZ due to hyponatremia.  He had had no further  vomiting and no further bradycardia nor any further nausea.  Plan will  be to follow up with Dr. Landry Dyke extender if Dr. Tresa Endo is not available  within a week or two and we would be best to see the patient earlier  secondary to follow up issues.  If he continues with episodic nausea,  certainly recheck his hepatic panel and perhaps an event monitor to  decide, which comes first.  Dr. Elsie Lincoln saw him on the day of discharge.      Darcella Gasman. Annie Paras, N.P.    ______________________________  Nicki Guadalajara, M.D.    LRI/MEDQ  D:  11/19/2007  T:  11/20/2007  Job:  [id number]   cc:   Nicki Guadalajara, M.D.  Kirk Ruths, M.D.

## 2010-09-18 NOTE — Cardiovascular Report (Signed)
NAME:  RAYDEN, DOCK NO.:  [id number]   MEDICAL RECORD NO.:  [id number]          PATIENT TYPE:  INP   LOCATION:  6527                         FACILITY:  [facility]   PHYSICIAN:  Nicki Guadalajara, M.D.     DATE OF BIRTH:  1929/05/04   DATE OF PROCEDURE:  11/13/2007  DATE OF DISCHARGE:                            CARDIAC CATHETERIZATION   INDICATIONS:  Mr. Campbell Kray is a 77 year old gentleman who has known  coronary artery disease and is status post PCI with stenting of his  right coronary artery in June 2000.  He also had documented disease in  his LAD, diagonal, and circumflex vessels, for which medical therapy has  been recommended.  He has history of hypothyroidism, hyperlipidemia,  hypertension.  He had recently developed recent episodes of chest pain.  On November 13, 2007, he was admitted to [facility] with a  diagnosis of unstable angina.  Definitive catheterization was  recommended.   PROCEDURE:  After premedication with Valium 5 mg intravenously, the  patient was prepped and draped in usual fashion.  His right femoral  artery was punctured anteriorly and a 5-French sheath was inserted  without difficulty.  Diagnostic catheterization was done utilizing 5-  [place] Judkins for left and right coronary catheters.  A 5-French  pigtail catheter was used for RAO ventriculography as well as distal  aortography.   With a demonstration of progressive high-grade disease in the right  coronary artery beyond the previously placed stent, a decision was made  to attempt percutaneous coronary intervention.  A 5-French sheath was  upgraded to a 6-French system.  The patient was given 600 mg of oral  Plavix.  He was also started on IV Integrilin double bolus and drip and  given weight adjusted heparinization.  ACT was documented to be  therapeutic.  A 6-French FR-4 guide with side holes was used for the  interventional procedure.  A Prowater wire was advanced down the  right  coronary artery.  It was felt that this could be primary stented.  The  lesion to be primary stented.  A 2.5 x 18-mm drug-eluting Cypher stent  was then inserted.  Initially, there was stent hang up in the previously  placed stent on a bend in the vessel, but ultimately this was able to be  traversed and the stent was positioned not at the site of the new lesion  beyond the previously placed stent.  Two dilatations at 14 and at 16  atmospheres were made.  A 2.75 x 15-mm Quantum balloon was used for  poststent dilatation.  In addition, the more proximal bend in the  previously placed stent where there was initial hang up was also dilated  to smooth this area out which had had 40% narrowing on the bend in the  previously placed stent.  Following balloon dilatation, with the stented  segment being dilated to 2.76-mm, scout angiography confirmed excellent  angiographic results.  The patient tolerated the procedure well.  At the  completion of the procedure, ACT was therapeutic.   Hemodynamic data:  Central aortic pressure was  100/60.  Left ventricle  pressure is 100/12.   Angiographic data:  Left main coronary was angiographically normal and  bifurcated into an LAD and left circumflex system.   The LAD had proximal luminal irregularity of 30-40%.  First diagonal  vessel was small-caliber inch and had 70% ostial stenosis followed by 40-  50% narrowing in a small vessel.  The second diagonal vessel had 40%  narrowing.  The LAD beyond the diagonal and septal perforating artery  had tubular smooth narrowing of 30%.   The circumflex vessel had mild narrowing of 40% between the OM1 and OM2  vessel.   The right coronary artery had mild narrowing of 20-30% proximally.  The  previously placed Multilink stent was old on a bend in the stented  segment, not a possible bend with narrowing  of 30-40%.  Beyond the  stented segment just proximal to the crux, there was new lesion of 85%   stenosis.  The RCA ended in the PDA and PLA system.   RAO ventriculography revealed normal LV contractility without focal  segmental wall motion abnormalities.   Distal aortography revealed a relatively normal aortoiliac system.   Following successful percutaneous coronary intervention to the right  coronary artery, the 85% stenosis was reduced to 0% with insertion of a  2.5 x 18-mm drug-eluting Cypher stent with post dilatation up to 2.76 mm  utilizing a 2.75 Quantum balloon.  There also was improvement in the  previously placed stent on a bend in the vessel where the newly placed  stent had initially hung up, and this was reduced to 10%.   IMPRESSION:  1. Normal LV function.  2. Coronary obstructive disease with 30-40% proximal LAD stenosis, 70-      50% narrowing in a small first diagonal branch of the LAD, 40%      narrowing in the small second branch of the diagonal branch of the      LAD with 30% tubular narrowing in the LAD; 40% narrowing in the AV      groove circumflex; 20-30% proximal narrowing in the right coronary      artery prior to the previously placed stent with patent previously      placed Multilink stent in the midportion of the vessel with new      lesion of 85% beyond the stented segment proximal to the crux.  3. Successful percutaneous coronary prevention with stenting of this      85% lesion with a 2.5 x 18-mm drug-eluting Cypher stent postdilated      to 2.76 mm done with double bolus Integrilin/weight adjusted      heparinization, 670 mg oral Plavix and IC nitroglycerin.           ______________________________  Nicki Guadalajara, M.D.     TK/MEDQ  D:  11/16/2007  T:  11/17/2007  Job:  [id number]

## 2010-09-18 NOTE — Discharge Summary (Signed)
NAME:  Dale [name], Dale [name]                ACCOUNT NO.:  [id number]   MEDICAL RECORD NO.:  [id number]          PATIENT TYPE:  OIB   LOCATION:  4729                         FACILITY:  [facility]   PHYSICIAN:  Vonna Kotyk R. Jacinto Halim, MD       DATE OF BIRTH:  1928/11/07   DATE OF ADMISSION:  11/25/2007  DATE OF DISCHARGE:  11/26/2007                               DISCHARGE SUMMARY   DISCHARGE DIAGNOSES:  1. Chest pain with radiation to the left arm occurs with tachycardia.      a.     Negative myocardial infarction.      b.     Stable coronary arteries.      c.     Questionable related to tachycardia.  2. The recent change in medications due to severe bradycardia, heart      rates in the 30s with nausea and vomiting.  3. Now presumed tachycardia with ambulation most likely due to      reducing beta blockers.  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  4. Coronary disease with recent stents to the right coronary artery,      patent.  5. Hypertension.  6. Hypercholesterolemia.  7. Hyperthyroidism.  8. Idiopathic thrombocytopenia.  9. History of pulmonary sarcoid followed by [facility].  10.History of gastroesophageal reflux disease and esophageal      stricture.   DISCHARGE CONDITION:  Improved.   PROCEDURES:  Heart catheterization, November 25, 2007, by Dr. Lynnea Ferrier  revealing patent stents to the RCA and nonobstructive disease, otherwise  EF normal.   Please note, also the patient was thought to have history of heart  failure as I noticed that in our records are in the dictated reports in  the hospital with EF always been normal and not elevated.  The patient  does not have heart failure.   ALLERGIES:  IMDUR, TYLENOL, DERMATOP, DRISTAN, TENORMIN ?,  PROCARDIA,  LOPRESSOR ?, and CARDIZEM ? and these may be causing bradycardia.  PENICILLIN, SULFA, ANACIN, MORPHINE, TUSSIONEX, ROBITUSSIN, DOXAZOSIN,  SOMA, and CYTOTEC.  Also, GI upset with DICLOFENAC, ERYTHROMYCIN, and  AMPHETAMINE.   DISCHARGE MEDICATIONS:  1. Crestor 5 mg daily.  2. Travatan 0.004% 1 drop each eye daily.  3. GenTeal 1 drop each eye daily drops.  4. Finasteride 5 mg daily.  5. Levothyroxine 100 mcg daily.  6. Cozaar 100 mg daily.  7. Enteric-coated aspirin 325 mg daily.  8. Lopressor 25 mg twice a day, this is a new dose was increased from      12-1/2 twice a day.  9. Verapamil was changed to diltiazem CD 120 daily at noon.  We will      have to monitor as he supposedly has a reaction in the past and      soon he has one also.  10.Lopressor, questionable fatigue only.  11.Plavix 75 mg daily.  12.Zetia 10 mg daily.  13.Pepcid twice a day which we will continue, at one point we are      going to change it to Protonix, but he has nausea and  vomiting with      Protonix.  14.Nitroglycerin sublingual 1 every 5 minutes as needed for chest      pain.   DISCHARGE INSTRUCTIONS:  1. Wash cath site with soap and water.  Call if any bleeding,      swelling, or drainage.  2. Low-sodium, heart-healthy diet.  3. Activity.  No lifting for 1 week greater than 10 pounds.  Increase      activity slowly strenuous activity for 1 week.  4. Followup with Dr. Lynnea Ferrier December 02, 2007, at 2:00 p.m. for results      of his testing.  5. You are scheduled for Pers[facility] Myoview, Monday on November 30, 2007      at 9:45, with [facility].  Do not eat or drink      after midnight.  Call if any worse.  6. You are scheduled to go to [facility] and to [facility] today      to take a heart rate monitor at 11 o'clock at your discharge time.   Please note in the hospital, he was on Imdur 90 which was discontinued  as he does not tolerate Imdur.   HISTORY OF PRESENT ILLNESS:  Mr. Rueb was recently admitted November 13, 2007, with unstable angina, enzymes were negative, but he did have  cardiac cath and got Cypher stent to the RCA, distal to the previous  stent and the previous stent was patent.  During that  hospitalization,  he had severe bradycardia to the 30s that cause nausea and vomiting.  The bradycardia occurred first and then the nausea and vomiting.  His  medicines were adjusted and then we stopped the atenolol, changed to  Lopressor 12-1/2 twice a day as well as we decreased his verapamil to  120 from 240.  Since then he went home, initially he felt great, no  pains at all, no complaints, but then he did started developing it with  movement, significant tachycardia the patient states and chest  discomfort and at one point, he had left arm discomfort like a bad ache  in the left arm.  He came back to the hospital, underwent repeat  catheterization and the stents were patent and mild nonobstructive  coronary disease, otherwise with normal EF.  He was kept overnight.  Plans will be for outpatient stress test as well as want to see if any  of the nonobstructive disease is causing a problem for the patient and  to evaluate heart rate, we also put him on event monitor to evaluate  tachycardia-bradycardia.  He will follow up with Dr. Lynnea Ferrier initially  and then return to Dr. Tresa Endo once the tests are complete.   Please note also the patient had been on Hyzaar, which we had  discontinued on the last admission secondary to hyponatremia.  He is now  on Cozaar.   During this hospitalization, he has been stable, otherwise he had no  tachycardia fear.   LABORATORY DATA:  Admission labs, hemoglobin 11.9, hematocrit 35, WBC  7.8, platelets 220, and MCV 102.  Sodium 135, potassium 4.6, chloride  103, CO2 27, BUN 10, creatinine 0.73, glucose 98, and INR 1.  PTT 29.  Protime 13.9 and calcium 9.2.   Vital signs at discharge, Blood pressure 121/74, pulse 61, respiratory  rate 20, temp 97.9, oxygen saturation 97% room air, and heart rate was  60.  Heart, regular rate and rhythm.  No murmur, gallop, or rub.  Lungs  clear to  auscultation bilaterally.  Abdomen soft and nontender.  Positive bowel  sounds.  Right groin tender, a small hematoma smaller  than a golf ball.  Extremities with no edema.  After the patient  ambulated, he had no further complaint of the groin pain and is able to  ambulate and felt well.  He had no tachycardia.  EKGs showed no changes.   The patient will follow up with Dr. Lynnea Ferrier after his next study and  with his event monitor for further decisions made in his care.      Darcella Gasman. [place], N.P.      Cristy Hilts. Jacinto Halim, MD  Electronically Signed    LRI/MEDQ  D:  11/26/2007  T:  11/27/2007  Job:  [id number]   cc:   Nicki Guadalajara, M.D.  Kirk Ruths, M.D.

## 2010-09-18 NOTE — Op Note (Signed)
NAME:  Dale [name], Dale [name]                ACCOUNT NO.:  [id number]   MEDICAL RECORD NO.:  [id number]          PATIENT TYPE:  AMB   LOCATION:  DAY                           FACILITY:  [facility]   PHYSICIAN:  R. Roetta Sessions, M.D. DATE OF BIRTH:  Oct 28, 1928   DATE OF PROCEDURE:  10/30/2006  DATE OF DISCHARGE:                               OPERATIVE REPORT   PROCEDURE:  EGD with Elease Hashimoto dilation followed by screening colonoscopy.   INDICATIONS FOR PROCEDURE:  A 77 year old Caucasian male with recurrent  esophageal dysphagia in the setting of a known Schatzki's ring.  He has  never had a complete colonoscopy, only had a sigmoidoscopy and air-  contrast barium enema previously.  EGD with esophageal dilation and  colonoscopy now being done.  This approach has been discussed with the  patient at length.  Potential risks, benefits and alternatives have been  reviewed, questions answered and he is agreeable.  Please see  documentation in the medical record.   PROCEDURE NOTE:  O2 saturation, blood pressure, pulse and respirations  were monitored throughout  the entire procedure.   CONSCIOUS SEDATION:  Versed 3 mg IV, Demerol 75 mg IV in divided doses.  Cetacaine spray for topical pharyngeal anesthesia.   INSTRUMENTATION:  Pentax video chip system (pediatric colonoscope).   FINDINGS:  EGD examination of the tubular esophagus again revealed  distal tandem rings.  There was no esophagitis, Barrett's esophagus or  other abnormality.  EG junction was easily traversed with the scope.   Stomach:  Gastric cavity was emptied and insufflated well with air.  Thorough examination of the gastric including retroflexed view of the  proximal stomach and esophagogastric junction demonstrated only a small  hiatal hernia.  The pylorus patent, easily traversed.  Examination of  bulb and second portion revealed no abnormalities.   THERAPEUTIC/DIAGNOSTIC MANEUVERS PERFORMED:  Scope was withdrawn.  The  56-French  [facility] dilator was passed to full insertion without any  resistance.  A look back revealed a rings had been disrupted and there  was a superficial tear at this level, but no apparent complication  related to passage of the dilator.  The patient tolerated the procedure  well and was prepared for colonoscopy.   Digital rectal exam revealed no abnormalities.   ENDOSCOPIC FINDINGS:  The prep was adequate.   Colon:  Colonic mucosa was surveyed from the rectosigmoid junction  through the left transverse and right colon to the area of appendiceal  orifice, ileocecal valve and cecum.  These structures were well seen and  photographed for the record.  From this level the scope was slowly  withdrawn.  All previously mentioned mucosal surfaces were again seen.  The patient had sigmoid diverticula.  Remainder of colonic mucosa  appeared normal.  Scope was pulled down in the rectum where thorough  examination of the rectal mucosa including retroflexed view of the anal  verge revealed no abnormalities.  The patient tolerated both procedures  well, was reactive to endoscopy.   IMPRESSION:  1. 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  Otherwise normal esophagus.  2. Small hiatal hernia otherwise normal stomach, D1 and D2.  3. Colonoscopy findings:  Normal rectum, sigmoid diverticula.      Remainder of colonic mucosa appeared normal.   RECOMMENDATIONS:  1. Begin Prevacid 30 grams orally daily.  2. Gastroesophageal flux disease and diverticulosis literature      provided to Mr. Chill.  3. Mr. Zarrella is to call if he has any future as the swallowing      difficulties.      Jonathon Bellows, M.D.  Electronically Signed     RMR/MEDQ  D:  10/30/2006  T:  10/30/2006  Job:  [id number]   cc:   Patrica Duel, M.D.  Fax: [phone]

## 2010-09-21 NOTE — Assessment & Plan Note (Signed)
[facility]                             PULMONARY OFFICE NOTE   NAME:JONESGalo, Sayed                       MRN:          [id number]  DATE:08/19/2006                            DOB:          03-17-1929    PROBLEM:  1. Sarcoid/abnormal chest x-ray/nodule.  2. Allergic rhinitis.  3. Allergic asthma.  4. Idiopathic thrombocytopenic purpura.  5. Coronary artery disease.   HISTORY:  He continues vaccine at 1 to 50, 0.4 mL per vial per week at  Dr.  Edison Simon office.  There have been concerns that he was having a  reaction but apparently not.  He has been okay this spring, no problems  on his vaccine now.  No substantial problems with nasal congestion.  He  has not had fever, adenopathy, night sweats or significant cough.   MEDICATIONS:  1. Isosorbide 60 mg x1-1/2.  2. Synthroid 100 mcg.  3. Aspirin 81 mg.  4. Verelan 200 mg.  5. Atenolol 25 mg.  6. Allergy vaccine.  7. Multivitamins.  8. Hyzaar 100/12.5.  9. Finasteride 5 mg.  10.Zetia 10 mg.  11.Lyrica.  12.Nasacort AQ.   DRUG INTOLERANCE:  PENICILLIN, SULFA, MORPHINE, DOXAZOSIN, TUSSIN,  ROBITUSSIN.   OBJECTIVE:  Weight 115 pounds.  Blood pressure 114/56.  Pulse regular,  66.  Room air saturation 98 percent.  I do not find adenopathy or rash.  Lungs sound quiet and clear.  There  is no cough or wheeze.  Nasal airway is not obstructed.  Heart sounds  are normal.   IMPRESSION:  Adequate control of his allergic rhinitis and sarcoid.   PLAN:  Continue vaccine.  Chest x-ray.  Refill Nasacort AQ.  Schedule  return in six months, earlier p.r.n.     Clinton D. Maple Hudson, MD, Tonny Bollman, FACP  Electronically Signed    CDY/MedQ  DD: 08/23/2006  DT: 08/23/2006  Job #: [id number]   cc:   Kirk Ruths, M.D.  Nicki Guadalajara, M.D.  Ladona Horns. Mariel Sleet, MD

## 2010-09-21 NOTE — Op Note (Signed)
NAME:  Dale [name], Dale [name]                ACCOUNT NO.:  [id number]   MEDICAL RECORD NO.:  [id number]          PATIENT TYPE:  AMB   LOCATION:  DAY                           FACILITY:  [facility]   PHYSICIAN:  Lionel December, M.D.    DATE OF BIRTH:  [birth year]   DATE OF PROCEDURE:  06/19/2004  DATE OF DISCHARGE:                                 OPERATIVE REPORT   PROCEDURE:  Esophagogastroduodenoscopy with esophageal dilation.   INDICATIONS:  Dale [name] is a 77 year old Caucasian male who presents with  intermittent solid food dysphagia.  He had a barium pill study in December  2004, which revealed a tiny hiatal hernia, a nonobstructing Schatzki's ring  and impaired esophageal motility.  However, lately he has been having  dysphagia and burning very frequently.  He is therefore undergoing  diagnostic EGD and colonoscopy.   Procedures were reviewed the patient, informed signed was obtained.   PREMEDICATION:  Cetacaine spray for pharyngeal topical anesthesia, Demerol  25 mg IV, Versed 4 mg IV in divided dose.   FINDINGS:  Procedure performed in endoscopy suite.  The patient's vital  signs and O2 saturation were monitored during procedure and remained stable.  The patient was placed in left lateral recumbent position and Olympus video  scope was passed via oropharynx without any difficulty into esophagus.   Esophagus:  There was a patchy exudate more in the proximal esophagus  consistent with Candida esophagitis.  There were two rings distally, one at  GE junction and the second one was just above that.  The scope was easily passed across them into stomach.  Hernia was not  apparent.  The GE junction was a 38-39 cm.   Stomach:  It was empty and distended very well with insufflation.  Folds of  the proximal stomach were normal.  Examination of the mucosa at body, antrum  and pyloric channel as well as angularis, fundus and cardia was normal.   Duodenum:  Bulbar mucosa was normal.  The scope was passed  to the second  part of duodenum, where mucosa and folds were normal.  Endoscope was  withdrawn.   Esophagus was dilated by passing 56-French Maloney dilator to full  insertion.  As the dilator was withdrawn, endoscope was passed again..  Three linear tears were identified, one at cervical esophagus indicative of  web not initially seen, and the second tear was at distal esophagus proximal  to GE junction, and the third one, which was the largest of the three, was  right at GE junction.  Pictures taken for the record and the endoscope was  withdrawn.  The patient tolerated the procedure well.   FINAL DIAGNOSIS:  1.  Two esophageal rings and esophageal web as described above.  All of      these were disrupted by passing 56-French [facility] dilator.  2.  Candida esophagitis,which appears to be incidental given history of      antibiotic use, but nevertheless will be treated.   RECOMMENDATIONS:  1.  He should increase his Pepcid to 20 mg q.h.s.  2.  Mycostatin suspension 500,000 units swish  and swallow q.i.d. for 10      days.  3.  He will call us should he experience recurrent dysphagia.      NR/MEDQ  D:  06/19/2004  T:  06/19/2004  Job:  [id number]   cc:   Kirk Ruths, M.D.  [address]  [place]  [place] [address]  Fax: [phone]

## 2010-09-21 NOTE — Discharge Summary (Signed)
[place]  Patient:    Dale [name], Dale [name]                       MRN: [id number] Adm. Date:  [id number] Disc. Date: 12/16/00 Attending:  Darlin Priestly Dictator:   Darcella Gasman. Annie Paras, F.N.P.C. CC:         Lennette Bihari, M.D.  Dr. Kandy Garrison   Discharge Summary  DISCHARGE DIAGNOSES: 1. Chest pain, nonspecific, negative myocardial infarction. 2. Near syncope secondary to medications. 3. Coronary artery disease with history of stent to the right coronary artery. 4. Hyperlipidemia. 5. Hypothyroidism. 6. Hypertension.  DISCHARGE CONDITION:  Improved.  PROCEDURES:  None.  DISCHARGE MEDICATIONS:  1. Atenolol 25 mg one twice a day as before.  2. Enteric coated aspirin 325 one daily as before.  3. Synthroid 0.1 mg daily as before.  4. Proscar 5 mg one daily as before.  5. Pravachol 40 mg one daily as before.  6. Imdur 60 one daily as before.  7. Altace 5 mg one daily as before.  8. Levbid 0.375 mg twice a day as before.  9. Claritin 10 mg daily as before. 10. Colace one daily as before. 11. Covera 240 mg one daily as before. 12. Timoptic ophthalmic solution 0.5% one drop each eye every morning as     before. 13. (New) Protonix 40 mg one daily. 14. (New) Vioxx 25 mg one daily for five days and then as needed for pain. 15. Nitroglycerin 1/150 only take one as needed for chest pain and always lie     down.  DISCHARGE INSTRUCTIONS: 1. No strenuous activity until you see Lennette Bihari, M.D., back. 2. Low fat, low salt diet. 3. You are scheduled for dobutamine Cardiolite December 17, 2000, at 8:30 a.m.    on the second floor [facility].  Do not take atenolol    until after this study. 4. Follow-up with Dr. Tresa Endo December 23, 2000, at 12:25.  HISTORY OF PRESENT ILLNESS:  A 77 year old married white male with history of coronary disease including PTCA and stent in June of 2000 to the mid RCA and residual disease with 60% LAD  stenosis and 90% proximal diagonal with two blockages. Cardiac EF of 67% in April 2001.  At that time he had no ischemia on Cardiolite.  The patient presented to the emergency room December 15, 2000, after working outside when he experienced chest pain and pressure.  He took nitroglycerin without relief, became diaphoretic and lightheaded.  After the second nitroglycerin, the chest pain eased off but never went away completely.  He came to the emergency room after talking with the [place] office. No associated symptoms.  PAST MEDICAL HISTORY:  Cardiac, as stated.  Hyperlipidemia. Hypothyroidism, Hypertension.  For family history, social history, review of systems see history and physical.  ALLERGIES:  PENICILLIN, SULFA, and MORPHINE.  OUTPATIENT MEDICATIONS:  Same as discharge medications today except for the addition of Protonix and Vioxx.  DISCHARGE PHYSICAL EXAMINATION:  GENERAL APPEARANCE:  VITAL SIGNS:  Blood pressure 140/70, pulse 73, respiratory rate 20, afebrile.  LUNGS:  Clear without wheezing, rhonchi or rales.  CARDIOVASCULAR:  S1 and S2, regular rate and rhythm.  EXTREMITIES:  Right groin is stable.  The patient did continue to complain of atypical pleuritic right-sided chest pain.  LABORATORY DATA:  Hemoglobin 12.3, hematocrit 34.8, platelets 196, wbc 7.9; neutrophils 69, lymphs 9, mono 9, eos 13, baso 0.  Protime 13.6, INR 1.1, PTT 27.  Sodium 145, potassium 4.4, chloride 111, CO2 22, glucose 104, BUN 20, creatinine 1.2, bilirubin 0.8, alkaline phosphatase 72, SGOT 24, SGPT 18, total protein 6.5, albumin 3.4, calcium 9.1.  CKs ranged 84 to 49, MB 1.6 to 0.9 and troponin 0.02 to less than 0.01.  RADIOLOGY:  Initial portable chest x-ray showed opacities bilaterally, right greater than left, possible pneumonia. Follow-up PA and lateral chest x-ray on December 16, 2000, showed patchy mild atelectatic infiltrative changes right middle lobe and  perihilar regions bilaterally. There was improvement aeration in the left mid lungs compared to the December 15, 2000, study.  The heart was normal, no mediastinal abnormalities.  EKG showed sinus bradycardia, heart rate 51.  No acute changes.  HOSPITAL COURSE:  The patient was admitted by Lenise Herald, M.D., on December 15, 2000, with chest discomfort and near syncope, was started on his routine medications.  He was basically observed overnight.  Cardiac enzymes were negative.  On December 16, 2000, he was ready for discharge home, still with pleuritic pain.  He was placed on  Vioxx.  He was scheduled for dobutamine Cardiolite on December 17, 2000, and was to follow up with Dr. Tresa Endo on December 23, 2000.  He was discharged by Runell Gess, M.D.  The patient will need follow-up PA and lateral chest x-ray to assess his mid lung zone atelectatic infiltrative changes. DD:  12/16/00 TD:  12/16/00 Job: [id number]

## 2010-09-21 NOTE — H&P (Signed)
NAME:  Dale [name], Dale [name]                ACCOUNT NO.:  [id number]   MEDICAL RECORD NO.:  [id number]          PATIENT TYPE:  AMB   LOCATION:  DAY                           FACILITY:  [facility]   PHYSICIAN:  Lionel December, M.D.    DATE OF BIRTH:  1928-05-31   DATE OF ADMISSION:  DATE OF DISCHARGE:  12/19/2005LH                                HISTORY & PHYSICAL   CHIEF COMPLAINT:  Difficulty swallowing.   HISTORY OF PRESENT ILLNESS:  The patient is a 77 year old Caucasian  gentleman who presents today with a one-month history of increasing  difficulty swallowing.  He has had some problems with swallowing on and off  in the past.  He actually had a barium pill esophagogram in December of  2004, which revealed mild diffuse impairment of esophageal motility, tiny  sliding hiatal hernia with a nonobstructing Schatzki's ring at the distal  esophagus.  He had no esophagitis or mass.  About four weeks ago, he started  having a lot of burning up and down his esophagus.  He had pain associated  with this.  He was started on one of the PPI therapies, which did seem to  help.  He has also been taking famotidine , which seems to help.  The  burning sensation has resolved, however, he continues to have dysphagia to  both pills and solid foods.  He notes that certain large pills that he takes  seem to hang in his esophagus.  When he tries to wash them down, the fluid  and the pills come back up.  He has pain in his chest when this occurs.  Currently he is just on famotidine and this is controlling the burning  sensation.  He denies any constipation, diarrhea, melena, rectal bleeding  and abdominal pain.   CURRENT MEDICATIONS:  1.  Timoptic 0.5% daily.  2.  Allergy shots one weekly.  3.  Atenolol 25 mg b.i.d.  4.  Aspirin 81 mg daily.  5.  Synthroid 0.1 mg daily.  6.  Proscar 5 mg daily.  7.  Pravachol 20 mg daily.  8.  Cephalexin 250 mg b.i.d.  9.  Isosorbide 60 mg one-and-a-half tablets daily.  10.  Hyzaar q.h.s.  11. Verelan 200 mg daily.  12. Vitamin C 500 mg daily.  13. Vitamin E 400 mg daily.  14. Centrum Silver daily.  15. Zyrtec 5 mg p.r.n.  16. Docusate sodium 100 mg b.i.d.  17. Mylanta p.r.n.  18. Famotidine 10 mg daily.  19. Mucinex 600 mg up to four times daily.  20. Levaquin 250 mg daily.  21. Albuterol inhaler p.r.n.   ALLERGIES:  Multiple and include:  1.  IMDUR.  2.  TYLENOL.  3.  DIMETAPP.  4.  DRISTAN.  5.  TENORMIN.  6.  PROCARDIA.  7.  LOPRESSOR.  8.  CARDIZEM.  9.  PENICILLIN.  10. SULFA.  11. ANACIN.  12. MORPHINE.  13. TUSSIONEX.  14. ROBITUSSIN.  15. DOXAZOSIN.  16. SOMA.  17. CYTOTEC.   PAST MEDICAL HISTORY:  1.  Bilateral glaucoma.  2.  Allergic rhinitis.  3.  Hypothyroidism.  4.  CHF.  5.  Hypercholesterolemia.  6.  Coronary artery disease, status post multiple catheterization with stent      placements.  7.  Peptic ulcer disease with bleed over 30 years ago.  8.  Pulmonary sarcoidosis diagnosed in 1997 by mediastinoscopy.  9.  History of nephrolithiasis.  10. Anal fissure.  11. Idiopathic thrombocytopenia purpura in 2002 requiring hospitalization.  12. He had an EGD in 1998 which revealed antral gastritis and no peptic      ulcer disease.  13. He had a flexible sigmoidoscopy in 1999 which was normal, but limited to      30 cm.   FAMILY HISTORY:  Significant for coronary artery disease.  Denies any  colorectal cancer.   SOCIAL HISTORY:  He is married and has a child.  He is retired from [employer].  He has never been a smoker.  Denies any alcohol use.   REVIEW OF SYSTEMS:  GASTROINTESTINAL:  See HPI.  CARDIOPULMONARY:  Denies  any shortness of breath or chest pain.  CONSTITUTIONAL:  Denies weight loss.   PHYSICAL EXAMINATION:  WEIGHT:  152 pounds.  HEIGHT:  5 feet 10 inches.  VITAL SIGNS:  Temperature 97.5 degrees, blood pressure 118/62, pulse 64.  GENERAL APPEARANCE:  A pleasant, elderly, Caucasian male in no  acute  distress.  SKIN:  Warm and dry.  No jaundice.  HEENT:  The conjunctivae are pink.  The sclerae are nonicteric.  Oropharyngeal mucosa moist and pink.  No lesions, erythema or exudate.  NECK:  No lymphadenopathy.  CHEST:  Lungs are clear to auscultation.  CARDIAC:  Regular rate and rhythm.  Normal S1 and S2.  No murmurs, rubs or  gallops.  ABDOMEN:  Positive bowel sounds.  Soft, nontender and nondistended.  No  organomegaly or masses.  EXTREMITIES:  No edema.   IMPRESSION:  [name] is a 77 year old gentleman with dysphagia primarily  to pills, but also to solid foods.  A barium pill esophagogram over a year  ago revealed a nonobstructing Schatzki's ring at that time.  It may be that  the obstruction is more critical now.  He describes some near impactions  with pills.  Typical reflux symptoms are now well controlled on some  famotidine.   PLAN:  1.  EGD with esophageal dilation in the near future.  2.  We have discussed a screening colonoscopy and will plan on doing this      sometime in the near future after dysphagia resolves.      LL/MEDQ  D:  06/14/2004  T:  06/14/2004  Job:  [id number]

## 2010-09-21 NOTE — Discharge Summary (Signed)
[facility]  Patient:    Dale [name], Dale [name] Visit Number: [id number] MRN: [id number]          Service Type: MED Location: 4E 0424 01 Attending Physician:  Pierce Crane Dictated by:   Pierce Crane, M.D. Admit Date:  05/05/2001 Disc. Date: 05/07/01   CC:         Dr. Sherrin Daisy, [place]  Ladona Horns. Mariel Sleet, M.D.  Lennette Bihari, M.D.   Discharge Summary  DATE OF BIRTH:  Jul 11, 1928  DISCHARGE DIAGNOSIS:  Idiopathic thrombocytopenic purpura.  HISTORY OF PRESENT ILLNESS:  Dale [name] is a 77 year old gentleman with a known history of hyperlipidemia, ischemic heart disease and hypothyroidism.  He presented with a three- to four-day history of increasing bruising and petechiae.  He was seen by his primary care doctor and noted blood count of less than 10,000.  He was referred to [facility] for further followup.  He was seen that day by myself.  His exam was significant for lower extremity petechiae.  He did have an area of moist purpura in the inner check and diffuse ecchymosis.  He did not have a history of epistaxis or gum bleeding. He had no history of sweats or fevers.  Systemically, he felt well with no weight loss.  PHYSICAL EXAMINATION:  GENERAL:  A healthy-looking gentleman.  VITAL SIGNS:  Blood pressure 138/72, temperature 97, pulse 54, respiratory rate 20.  HEENT:  Oropharyngeal examination significant for an area of purpura in the inner cheek.  He had some scattered caries in his mouth.  LUNGS:  Clear.  NODES:  No cervical or supraclavicular adenopathy.  CARDIAC:  Within normal limits.  ABDOMEN:  No splenomegaly was seen.  EXTREMITIES:  Did show petechiae.  LABORATORY DATA AND X-RAY FINDINGS:  CBC when rechecked was normal with platelet count of 5000.  Peripheral blood smear was reviewed and did not show any other abnormalities apart from absence of platelets.  The patient underwent a bone marrow biopsy on the day of his  initial visit which showed a hypocellular marrow and no evidence of leukemia.  ASSESSMENT/PLAN:  The patient was admitted to the hospital for diagnosis of idiopathic thrombocytopenic purpura.  HOSPITAL COURSE:  The patient was admitted to the hospital and remained stable throughout.  On the day of admission, he received WinRho at dose 75 mcg/kg for total dose of 5200 mcg.  He tolerated this well.  He was also started on prednisone 70 mg a day.  He had some chills with WinRho administration and had some mild epigastric discomfort when taking prednisone.  He was started on Protonix.  On May 07, 2001, his CBC showed a white count of 18, hemoglobin 12.1 and platelet count of 15,000.  He felt well and his clinical exam was unremarkable.  He was felt to be stable for discharge.  He was felt to have a good response to the WinRho and prednisone.  DISCHARGE MEDICATIONS:  1. Prednisone 70 mg a day with a taper over two to three weeks.  2. Protonix 40 mg a day.  3. Atenolol.  4. Synthroid.  5. Proscar.  6. Pravachol.  7. Imdur.  8. Altace.  9. Levbid. 10. Claritin. 11. Covera. 12. Timoptic ophthalmic solution.  SPECIAL INSTRUCTIONS:  He was asked to hold his aspirin to such time his platelet counts recovered.  FOLLOWUP:  Arrangements were made for him to be seen by Dr. Donnie Coffin in the outpatient clinic for recheck CBC on May 08, 2001.  He will be followed up by Dr. Mariel Sleet in the outpatient setting at [facility] in a weeks time where he will be followed for management of his ITP.  CONDITION ON DISCHARGE:  Stable. Dictated by:   Pierce Crane, M.D.  Attending Physician:  Pierce Crane DD:  05/07/01 TD:  05/07/01 Job: [id number]

## 2010-09-21 NOTE — H&P (Signed)
[facility]  Patient:    Dale [name], Dale [name] Visit Number: [id number] MRN: [id number]          Service Type: MED Location: 4E 0424 01 Attending Physician:  Dale [name] Dictated by:   Dale [name], M.D. Admit Date:  05/05/2001   CC:         Dale [name], M.D.  Dale [name], M.D.   History and Physical  PROBLEM:  Thrombocytopenia.  PATIENT IDENTIFICATION:  The patient is a 77 year old white gentleman from [place].  HISTORY OF PRESENT ILLNESS:  This gentleman has a known history of hyperlipidemia, hypothyroidism, hypertension, and ischemic heart disease.  he has been followed by Dr. Regino [name] in [place].  He has been in relatively good health.  He has had no recent medical problems.  He does have a history of chronic easy bruising.  He does note some increased petechiae over the past 4-5 days.  He has not noticed any obvious epistaxis, but has had a little bit of oozing from his nose.  He has also, over the weekend, noted some bleeding from the inside of his mouth.  He denies any night sweats or fevers.  He denies any headaches.  He has been seen by his family doctor today, who did a CBC.  This indicated a normal white count and hemoglobin with a platelet count of less than 10,000.  He was referred for further evaluation.  PAST MEDICAL HISTORY: 1. Ischemic heart disease with a history of stent placement in the right    coronary artery in June 2000. 2. History of hyperlipidemia. 3. History of hypothyroidism. 4. No history of myocardial infarctions.  PAST SURGICAL HISTORY: 1. History of mediastinoscopy for what appears to be sarcoid diagnosed about    five years ago. 2. History of mole removal from right cheek.  SMOKING HISTORY:  None.  ALCOHOL CONSUMPTION:  None.  BLOOD TRANSFUSIONS:  Many years ago secondary to GI bleed.  CURRENT MEDICATIONS:  1. Atenolol 25 mg q.d.  2. Enteric coated aspirin, which was discontinued.  3.  Synthroid 0.1 mg q.d.  4. Proscar 5 mg q.d.  5. Pravachol 40 mg q.d.  6. Imdur 60 mg q.d.  7. Altace 5 mg q.d.  8. Levbid 0.375 mg b.i.d.  9. Claritin 10 mg q.d. 10. Covera 240 mg q.d. 11. Timoptic ophthalmic solution. 12. He had previously been placed on Protonix and Vioxx.  We discontinued     this.  REVIEW OF SYSTEMS:  The patient has a history of episodic chest pain.  He does have a history of known ischemic heart disease with a history of ejection fraction of 57% documented in April 2001.  He has a history of stent placement in June 2000 in the mid RCA with a history of 69% LAD stenosis and 90% proximal diagonal with two blockages.  He denies night sweats, fevers or weight loss.  He denies any swallowing problems, hematemesis, hematochezia, hemoptysis.  No nausea or vomiting.  No diarrhea.  No constipation.  No lower extremity pain, numbness or tingling.  SOCIAL HISTORY:  He is a nonsmoker and nonalcohol consumer.  He is retired. He worked in [employer].  He is married for 38 years.  He has one adult son living in [place].  PHYSICAL EXAMINATION:  GENERAL:  Pleasant man looking his stated age.  He has scattered ecchymoses on his arms and legs.  Petechiae on his lower extremities.  VITAL SIGNS:  Blood pressure 138/72, temperature 97, pulse 54, respiratory  rate 20, weight 153.  HEENT:  Oropharynx normal.  He appears to have a fleshy, brown, exophytic mass.  There is some blood oozing from this.  No other abnormalities are noted.  NECK:  No cervical or supraclavicular adenopathy.  LUNGS:  Clear.  CARDIAC:  Within normal limits.  No murmurs appreciated.  ABDOMINAL:  No intra-abdominal organomegaly or masses appreciated.  No inguinal adenopathy.  EXTREMITIES:  Notable for petechiae.  No peripheral edema.  LABORATORY DATA:  White count 36.3, hemoglobin 13.2, platelet count less than 6000.  Review of peripheral blood smear does not show any obvious  blasts.  A bone marrow biopsy was performed today.  This shows abundant megakaryocytes.  IMPRESSION AND PLAN:  This patient appears to have ______ thrombocytopenia with features consistent with ITP.  After discussion with the patient, he will be admitted to the hospital to be started on prednisone.  He is O positive and will be given WinRho for this.  We discussed the importance of thrombocytopenic precautions.  He will be kept off his aspirin and be kept on his other medications. Dictated by:   Dale [name], M.D. Attending Physician:  Dale [name] DD:  05/05/01 TD:  05/05/01 Job: [id number]

## 2010-09-21 NOTE — Assessment & Plan Note (Signed)
[facility]                               PULMONARY OFFICE NOTE   NAME:[name] [name] LATTANZIO                       MRN:          [id number]  DATE:02/20/2006                            DOB:          1928/05/10    PULMONARY FOLLOW-UP:   PROBLEMS:  1. Sarcoid/abnormal chest x-ray/nodule.  2. Allergic rhinitis.  3. Allergic asthma.  4. Idiopathic thrombocytopenic purpura.  5. Coronary artery disease.   HISTORY:  He continues allergy vaccine at 1:50, 0.4 mL per vial per week,  through Dr. Edison [name] office.  With his previous vial he was having some  local reaction but with the current vial, that is not being seen and he says  everything is going well.  Not much itch, sneeze or drainage.  His nose has  been comfortable.  He does still sometimes have some postnasal drainage at  night.  He denies fever, chest tightness or wheeze.  He was going to have  rhinoscopy by Dr. Jearld [name], but that got postponed and the patient admits now  he has lost his nerve but thinks he still needs to.   MEDICATIONS:  1. Isosorbide 60 mg x1-1/2.  2. Synthroid 100 mcg.  3. Aspirin 81 mg.  4. Verelan 200 mg.  5. Atenolol 25 mg.  6. Allergy vaccine.  7. Multivitamin.  8. Hyzaar 100/12.5 mg.  9. Finasteride 5 mg.  10.Zetia 10 mg.  11.Zyrtec.  12.Famotidine.  13.Tricor 145 mg.   Drug intolerance to penicillin, sulfa, morphine, doxazosin, Tussin, and  Robitussin.   OBJECTIVE:  VITAL SIGNS:  Weight 145 pounds, BP 112/58, pulse regular at 62,  room air saturation 98%.  HEENT:  His nose is not obstructed.  There is a little mucous bridging.  Pharynx is clear.  LUNGS:  Clear to P&A.  I do not hear rales, wheeze or rhonchi today.  ABDOMEN:  I cannot feel liver or spleen.  CARDIAC:  Heart sounds are regular without murmur or gallop.   Chest x-ray in April had shown stable nodularity in the lungs with chronic  right middle lobe atelectasis and possible right hilar adenopathy,  all  previously seen, consistent with his sarcoid.  His ACE level on August 26, 2005, was within normal at 44 (9-67).  Pulmonary function tests in April had  shown mild obstructive disease with slight response to dilator, normal lung  volumes, and normal diffusion.   IMPRESSION:  1. Stable sarcoid.  2. Rhinitis, allergic.  3. If he is having enough discomfort to be concerned about it, I think he      ought to follow up with Dr. Jearld [name] and resolve the issue.   PLAN:  1. I am going to have our allergy lab call Dr. Edison [name] office to check      there about whether there are any questions about local reactions to      vaccine so that we can see what is going in relation to the patient's      comment.  2. Schedule return 6 months, earlier p.r.n.       [name] D.  [name] Hudson, MD, FCCP, FACP      CDY/MedQ  DD:  02/22/2006  DT:  02/24/2006  Job #:  [id number]   cc:   [name] [name], M.D.  [name] [name], M.D.  [name] Horns. Mariel Sleet, MD

## 2010-10-15 ENCOUNTER — Encounter: Payer: Self-pay | Admitting: Gastroenterology

## 2010-12-26 ENCOUNTER — Encounter: Payer: Self-pay | Admitting: Internal Medicine

## 2010-12-27 ENCOUNTER — Ambulatory Visit (INDEPENDENT_AMBULATORY_CARE_PROVIDER_SITE_OTHER): Payer: Medicare Other | Admitting: Internal Medicine

## 2010-12-27 ENCOUNTER — Encounter: Payer: Self-pay | Admitting: Internal Medicine

## 2010-12-27 VITALS — BP 136/72 | HR 55 | Ht 70.0 in | Wt 146.2 lb

## 2010-12-27 DIAGNOSIS — D869 Sarcoidosis, unspecified: Secondary | ICD-10-CM

## 2010-12-27 DIAGNOSIS — J309 Allergic rhinitis, unspecified: Secondary | ICD-10-CM

## 2010-12-27 NOTE — Patient Instructions (Signed)
Try otc antihisatmine loratadine/ Claritin- see if it helps with post nasal drip.

## 2010-12-27 NOTE — Assessment & Plan Note (Addendum)
Will try otc antihistamine, but he generally considers control to be good. We discussed irritant rhinitis versus allergic rhinitis. He decided to continue allergy vaccine another year and reassess.

## 2010-12-27 NOTE — Progress Notes (Signed)
Subjective:    Patient ID: Kennon Portela, male    DOB: 08-28-1928, 77 y.o.   MRN: [id number]  HPI 12/27/10-77 year old male never smoker followed for Allergic rhinitis, asthma,  hx sarcoid, glaucoma, history ITP Last here Sep 21, 2009-note reviewed. Allergy vaccine - continues long term. We discussed option to stop or retest.  CO throat tickle at night. Eating or drink, or melting ice in mouth will break it loose. Takes daily mucinex. Feels travatan eye drop get into nose and cause some swelling   Review of Systems Constitutional:   No-   weight loss, night sweats, fevers, chills, fatigue, lassitude. HEENT:   No-  headaches, difficulty swallowing, tooth/dental problems, sore throat,       No-  sneezing, itching, ear ache,                  + nasal congestion, post nasal drip,  CV:  No-   chest pain, orthopnea, PND, swelling in lower extremities, anasarca, dizziness, palpitations Resp: No-   shortness of breath with exertion or at rest.              No-   productive cough,  No non-productive cough,  No-  coughing up of blood.              No-   change in color of mucus.  No- wheezing.   Skin: No-   rash or lesions. GI:  No-   heartburn, indigestion, abdominal pain, nausea, vomiting, diarrhea,                 change in bowel habits, loss of appetite GU: No-   dysuria, change in color of urine, no urgency or frequency.  No- flank pain. MS:  No-   joint pain or swelling.  No- decreased range of motion.  No- back pain. Neuro- grossly normal to observation, Or:  Psych:  No- change in mood or affect. No depression or anxiety.  No memory loss.      Objective:   Physical Exam General- Alert, Oriented, Affect-appropriate, Distress- none acute   comfortable appearing elderly man Skin- rash-none, lesions- none, excoriation- none Lymphadenopathy- none Head- atraumatic            Eyes- Gross vision intact, PERRLA, conjunctivae clear secretions            Ears- Hearing,  mildly reduced, some  scarring right TM            Nose- Clear, no- Septal dev, mucus, polyps, erosion, perforation             Throat- Mallampati II , mucosa clear , drainage- none, tonsils- atrophic Neck- flexible , trachea midline, no stridor , thyroid nl, carotid no bruit Chest - symmetrical excursion , unlabored           Heart/CV- RRR , no murmur , no gallop  , no rub, nl s1 s2                           - JVD- none , edema- none, stasis changes- none, varices- none           Lung- clear to P&A, wheeze- none, cough- none , dullness-none, rub- none           Chest wall-  Abd- tender-no, distended-no, bowel sounds-present, HSM- no Br/ Gen/ Rectal- Not done, not indicated Extrem- cyanosis- none, clubbing, none, atrophy- none, strength- nl Neuro- grossly intact to  observation         Assessment & Plan:

## 2011-01-01 ENCOUNTER — Ambulatory Visit (INDEPENDENT_AMBULATORY_CARE_PROVIDER_SITE_OTHER): Payer: Medicare Other

## 2011-01-01 DIAGNOSIS — J309 Allergic rhinitis, unspecified: Secondary | ICD-10-CM

## 2011-01-23 LAB — CBC
MCHC: 33.3
MCV: 99.3
Platelets: 295
RBC: 3.97 — ABNORMAL LOW
RDW: 12.6

## 2011-01-31 LAB — LIPID PANEL
LDL Cholesterol: 57
Triglycerides: 68
VLDL: 14

## 2011-01-31 LAB — POCT CARDIAC MARKERS
CKMB, poc: 1.4
Myoglobin, poc: 56.3
Operator id: 222501
Troponin i, poc: <0.05

## 2011-01-31 LAB — CBC
HCT: 35.3 — ABNORMAL LOW
HCT: 35.8 — ABNORMAL LOW
HCT: 36.3 — ABNORMAL LOW
HCT: 36.5 — ABNORMAL LOW
Hemoglobin: 12.1 — ABNORMAL LOW
Hemoglobin: 12.3 — ABNORMAL LOW
Hemoglobin: 12.6 — ABNORMAL LOW
MCV: 100.8 — ABNORMAL HIGH
MCV: 100.9 — ABNORMAL HIGH
Platelets: 167
Platelets: 202
RBC: 3.47 — ABNORMAL LOW
RDW: 13.5
WBC: 4.9
WBC: 5.2
WBC: 5.5
WBC: 6.1
WBC: 6.2

## 2011-01-31 LAB — COMPREHENSIVE METABOLIC PANEL
AST: 23
CO2: 28
Calcium: 9.1
Creatinine, Ser: 0.82
GFR calc Af Amer: >60
GFR calc non Af Amer: >60
Sodium: 136
Total Protein: 5.6 — ABNORMAL LOW

## 2011-01-31 LAB — HEPATIC FUNCTION PANEL
ALT: 117 — ABNORMAL HIGH
AST: 103 — ABNORMAL HIGH
Albumin: 3.3 — ABNORMAL LOW
Alkaline Phosphatase: 53
Total Bilirubin: 0.6

## 2011-01-31 LAB — POCT I-STAT, CHEM 8
BUN: 20
Chloride: 101
Potassium: 4.2
Sodium: 136
TCO2: 30

## 2011-01-31 LAB — DIFFERENTIAL
Eosinophils Absolute: 0.3
Eosinophils Relative: 6 — ABNORMAL HIGH
Lymphocytes Relative: 17
Lymphs Abs: 0.9
Monocytes Absolute: 0.9

## 2011-01-31 LAB — BASIC METABOLIC PANEL
BUN: 8
CO2: 29
Chloride: 102
Creatinine, Ser: 0.84
GFR calc Af Amer: >60
GFR calc non Af Amer: >60
GFR calc non Af Amer: >60
Glucose, Bld: 97
Potassium: 3.7
Potassium: 4.2
Sodium: 133 — ABNORMAL LOW
Sodium: 135

## 2011-01-31 LAB — CK TOTAL AND CKMB (NOT AT ARMC)
CK, MB: 2.6
CK, MB: 3
CK, MB: 3.3
Relative Index: INVALID
Total CK: 77

## 2011-01-31 LAB — TROPONIN I
Troponin I: 0.03
Troponin I: 0.04

## 2011-01-31 LAB — APTT
aPTT: 28
aPTT: 43 — ABNORMAL HIGH

## 2011-01-31 LAB — CARDIAC PANEL(CRET KIN+CKTOT+MB+TROPI)
CK, MB: 2.3
Relative Index: INVALID
Total CK: 69

## 2011-01-31 LAB — MAGNESIUM: Magnesium: 2.3

## 2011-01-31 LAB — TSH: TSH: 0.67

## 2011-01-31 LAB — LIPASE, BLOOD: Lipase: 52

## 2011-02-01 LAB — BASIC METABOLIC PANEL
BUN: 10
BUN: 11
CO2: 28
Calcium: 9.2
Chloride: 91 — ABNORMAL LOW
Creatinine, Ser: 0.73
Creatinine, Ser: 0.85
GFR calc Af Amer: >60
Glucose, Bld: 100 — ABNORMAL HIGH

## 2011-02-01 LAB — CBC
HCT: 33.1 — ABNORMAL LOW
MCHC: 33.8
MCHC: 34.3
MCV: 101.8 — ABNORMAL HIGH
Platelets: 179
Platelets: 220
RBC: 3.42 — ABNORMAL LOW
RDW: 13.1
WBC: 7.8

## 2011-02-01 LAB — PROTIME-INR
INR: 1
Prothrombin Time: 13.9

## 2011-02-18 LAB — CBC
MCHC: 34.4
MCV: 99.9
Platelets: 231
RBC: 3.67 — ABNORMAL LOW
WBC: 6.9

## 2011-05-08 ENCOUNTER — Ambulatory Visit (INDEPENDENT_AMBULATORY_CARE_PROVIDER_SITE_OTHER): Payer: Medicare Other

## 2011-05-08 DIAGNOSIS — J309 Allergic rhinitis, unspecified: Secondary | ICD-10-CM | POA: Diagnosis not present

## 2011-05-09 DIAGNOSIS — T7840XA Allergy, unspecified, initial encounter: Secondary | ICD-10-CM | POA: Diagnosis not present

## 2011-05-10 ENCOUNTER — Encounter (HOSPITAL_COMMUNITY): Payer: Medicare Other | Attending: Oncology

## 2011-05-10 DIAGNOSIS — Z0189 Encounter for other specified special examinations: Secondary | ICD-10-CM | POA: Diagnosis not present

## 2011-05-10 DIAGNOSIS — D869 Sarcoidosis, unspecified: Secondary | ICD-10-CM | POA: Insufficient documentation

## 2011-05-10 LAB — CBC
MCV: 102.9 fL — ABNORMAL HIGH (ref 78.0–100.0)
Platelets: 231 10*3/uL (ref 150–400)
RBC: 3.85 MIL/uL — ABNORMAL LOW (ref 4.22–5.81)
RDW: 14 % (ref 11.5–15.5)
WBC: 6 10*3/uL (ref 4.0–10.5)

## 2011-05-10 NOTE — Progress Notes (Signed)
Labs drawn today for cbc 

## 2011-05-13 DIAGNOSIS — IMO0002 Reserved for concepts with insufficient information to code with codable children: Secondary | ICD-10-CM | POA: Diagnosis not present

## 2011-05-13 DIAGNOSIS — M545 Low back pain, unspecified: Secondary | ICD-10-CM | POA: Diagnosis not present

## 2011-05-13 DIAGNOSIS — M5430 Sciatica, unspecified side: Secondary | ICD-10-CM | POA: Diagnosis not present

## 2011-05-16 DIAGNOSIS — IMO0002 Reserved for concepts with insufficient information to code with codable children: Secondary | ICD-10-CM | POA: Diagnosis not present

## 2011-05-16 DIAGNOSIS — M545 Low back pain, unspecified: Secondary | ICD-10-CM | POA: Diagnosis not present

## 2011-05-16 DIAGNOSIS — J309 Allergic rhinitis, unspecified: Secondary | ICD-10-CM | POA: Diagnosis not present

## 2011-05-16 DIAGNOSIS — I10 Essential (primary) hypertension: Secondary | ICD-10-CM | POA: Diagnosis not present

## 2011-05-25 DIAGNOSIS — J309 Allergic rhinitis, unspecified: Secondary | ICD-10-CM | POA: Diagnosis not present

## 2011-05-28 DIAGNOSIS — M5430 Sciatica, unspecified side: Secondary | ICD-10-CM | POA: Diagnosis not present

## 2011-05-28 DIAGNOSIS — I10 Essential (primary) hypertension: Secondary | ICD-10-CM | POA: Diagnosis not present

## 2011-05-28 DIAGNOSIS — IMO0002 Reserved for concepts with insufficient information to code with codable children: Secondary | ICD-10-CM | POA: Diagnosis not present

## 2011-05-30 DIAGNOSIS — J309 Allergic rhinitis, unspecified: Secondary | ICD-10-CM | POA: Diagnosis not present

## 2011-06-05 DIAGNOSIS — J309 Allergic rhinitis, unspecified: Secondary | ICD-10-CM | POA: Diagnosis not present

## 2011-06-13 DIAGNOSIS — J309 Allergic rhinitis, unspecified: Secondary | ICD-10-CM | POA: Diagnosis not present

## 2011-06-20 DIAGNOSIS — J309 Allergic rhinitis, unspecified: Secondary | ICD-10-CM | POA: Diagnosis not present

## 2011-06-25 DIAGNOSIS — E039 Hypothyroidism, unspecified: Secondary | ICD-10-CM | POA: Diagnosis not present

## 2011-06-25 DIAGNOSIS — R05 Cough: Secondary | ICD-10-CM | POA: Diagnosis not present

## 2011-06-25 DIAGNOSIS — R059 Cough, unspecified: Secondary | ICD-10-CM | POA: Diagnosis not present

## 2011-06-25 DIAGNOSIS — J029 Acute pharyngitis, unspecified: Secondary | ICD-10-CM | POA: Diagnosis not present

## 2011-06-25 DIAGNOSIS — IMO0002 Reserved for concepts with insufficient information to code with codable children: Secondary | ICD-10-CM | POA: Diagnosis not present

## 2011-06-27 DIAGNOSIS — J3089 Other allergic rhinitis: Secondary | ICD-10-CM | POA: Diagnosis not present

## 2011-06-27 DIAGNOSIS — M5137 Other intervertebral disc degeneration, lumbosacral region: Secondary | ICD-10-CM | POA: Diagnosis not present

## 2011-06-28 ENCOUNTER — Other Ambulatory Visit (HOSPITAL_COMMUNITY): Payer: Self-pay | Admitting: Physical Medicine and Rehabilitation

## 2011-06-28 DIAGNOSIS — I10 Essential (primary) hypertension: Secondary | ICD-10-CM | POA: Diagnosis not present

## 2011-06-28 DIAGNOSIS — M545 Low back pain, unspecified: Secondary | ICD-10-CM

## 2011-06-28 DIAGNOSIS — J019 Acute sinusitis, unspecified: Secondary | ICD-10-CM | POA: Diagnosis not present

## 2011-06-28 DIAGNOSIS — IMO0002 Reserved for concepts with insufficient information to code with codable children: Secondary | ICD-10-CM | POA: Diagnosis not present

## 2011-06-28 DIAGNOSIS — E785 Hyperlipidemia, unspecified: Secondary | ICD-10-CM | POA: Diagnosis not present

## 2011-07-01 ENCOUNTER — Ambulatory Visit (HOSPITAL_COMMUNITY)
Admission: RE | Admit: 2011-07-01 | Discharge: 2011-07-01 | Disposition: A | Discharge status: Home / Self Care | Payer: Medicare Other | Source: Ambulatory Visit | Attending: Physical Medicine and Rehabilitation | Admitting: Physical Medicine and Rehabilitation

## 2011-07-01 DIAGNOSIS — M5126 Other intervertebral disc displacement, lumbar region: Secondary | ICD-10-CM | POA: Insufficient documentation

## 2011-07-01 DIAGNOSIS — M545 Low back pain, unspecified: Secondary | ICD-10-CM | POA: Diagnosis not present

## 2011-07-04 DIAGNOSIS — H3530 Unspecified macular degeneration: Secondary | ICD-10-CM | POA: Diagnosis not present

## 2011-07-04 DIAGNOSIS — H4011X0 Primary open-angle glaucoma, stage unspecified: Secondary | ICD-10-CM | POA: Diagnosis not present

## 2011-07-04 DIAGNOSIS — Z961 Presence of intraocular lens: Secondary | ICD-10-CM | POA: Diagnosis not present

## 2011-07-05 DIAGNOSIS — J309 Allergic rhinitis, unspecified: Secondary | ICD-10-CM | POA: Diagnosis not present

## 2011-07-11 DIAGNOSIS — J309 Allergic rhinitis, unspecified: Secondary | ICD-10-CM | POA: Diagnosis not present

## 2011-07-18 DIAGNOSIS — J309 Allergic rhinitis, unspecified: Secondary | ICD-10-CM | POA: Diagnosis not present

## 2011-07-19 DIAGNOSIS — H3530 Unspecified macular degeneration: Secondary | ICD-10-CM | POA: Diagnosis not present

## 2011-07-19 DIAGNOSIS — H35059 Retinal neovascularization, unspecified, unspecified eye: Secondary | ICD-10-CM | POA: Insufficient documentation

## 2011-07-22 DIAGNOSIS — M5137 Other intervertebral disc degeneration, lumbosacral region: Secondary | ICD-10-CM | POA: Diagnosis not present

## 2011-07-25 DIAGNOSIS — J309 Allergic rhinitis, unspecified: Secondary | ICD-10-CM | POA: Diagnosis not present

## 2011-07-31 DIAGNOSIS — J019 Acute sinusitis, unspecified: Secondary | ICD-10-CM | POA: Diagnosis not present

## 2011-07-31 DIAGNOSIS — J029 Acute pharyngitis, unspecified: Secondary | ICD-10-CM | POA: Diagnosis not present

## 2011-08-01 DIAGNOSIS — J309 Allergic rhinitis, unspecified: Secondary | ICD-10-CM | POA: Diagnosis not present

## 2011-08-08 DIAGNOSIS — R079 Chest pain, unspecified: Secondary | ICD-10-CM | POA: Diagnosis not present

## 2011-08-08 DIAGNOSIS — J309 Allergic rhinitis, unspecified: Secondary | ICD-10-CM | POA: Diagnosis not present

## 2011-08-08 DIAGNOSIS — E782 Mixed hyperlipidemia: Secondary | ICD-10-CM | POA: Diagnosis not present

## 2011-08-08 DIAGNOSIS — I2510 Atherosclerotic heart disease of native coronary artery without angina pectoris: Secondary | ICD-10-CM | POA: Diagnosis not present

## 2011-08-08 DIAGNOSIS — E039 Hypothyroidism, unspecified: Secondary | ICD-10-CM | POA: Diagnosis not present

## 2011-08-12 DIAGNOSIS — R5381 Other malaise: Secondary | ICD-10-CM | POA: Diagnosis not present

## 2011-08-12 DIAGNOSIS — Z79899 Other long term (current) drug therapy: Secondary | ICD-10-CM | POA: Diagnosis not present

## 2011-08-12 DIAGNOSIS — E782 Mixed hyperlipidemia: Secondary | ICD-10-CM | POA: Diagnosis not present

## 2011-08-14 ENCOUNTER — Emergency Department (HOSPITAL_COMMUNITY): Payer: Medicare Other

## 2011-08-14 ENCOUNTER — Observation Stay (HOSPITAL_COMMUNITY)
Admission: EM | Admit: 2011-08-14 | Discharge: 2011-08-16 | Disposition: A | Discharge status: Home / Self Care | Payer: Medicare Other | Attending: Internal Medicine | Admitting: Internal Medicine

## 2011-08-14 ENCOUNTER — Encounter (HOSPITAL_COMMUNITY): Payer: Self-pay | Admitting: *Deleted

## 2011-08-14 DIAGNOSIS — R9431 Abnormal electrocardiogram [ECG] [EKG]: Secondary | ICD-10-CM | POA: Insufficient documentation

## 2011-08-14 DIAGNOSIS — R0789 Other chest pain: Principal | ICD-10-CM | POA: Insufficient documentation

## 2011-08-14 DIAGNOSIS — D693 Immune thrombocytopenic purpura: Secondary | ICD-10-CM | POA: Insufficient documentation

## 2011-08-14 DIAGNOSIS — I2510 Atherosclerotic heart disease of native coronary artery without angina pectoris: Secondary | ICD-10-CM | POA: Insufficient documentation

## 2011-08-14 DIAGNOSIS — I10 Essential (primary) hypertension: Secondary | ICD-10-CM | POA: Insufficient documentation

## 2011-08-14 DIAGNOSIS — H3530 Unspecified macular degeneration: Secondary | ICD-10-CM

## 2011-08-14 DIAGNOSIS — E039 Hypothyroidism, unspecified: Secondary | ICD-10-CM | POA: Diagnosis not present

## 2011-08-14 DIAGNOSIS — R079 Chest pain, unspecified: Secondary | ICD-10-CM | POA: Diagnosis present

## 2011-08-14 DIAGNOSIS — I200 Unstable angina: Secondary | ICD-10-CM | POA: Diagnosis not present

## 2011-08-14 DIAGNOSIS — E7800 Pure hypercholesterolemia, unspecified: Secondary | ICD-10-CM | POA: Diagnosis not present

## 2011-08-14 DIAGNOSIS — Z9861 Coronary angioplasty status: Secondary | ICD-10-CM | POA: Insufficient documentation

## 2011-08-14 DIAGNOSIS — D869 Sarcoidosis, unspecified: Secondary | ICD-10-CM | POA: Insufficient documentation

## 2011-08-14 DIAGNOSIS — Z862 Personal history of diseases of the blood and blood-forming organs and certain disorders involving the immune mechanism: Secondary | ICD-10-CM

## 2011-08-14 HISTORY — DX: Essential (primary) hypertension: I10

## 2011-08-14 HISTORY — DX: Hyperlipidemia, unspecified: E78.5

## 2011-08-14 HISTORY — DX: Hypothyroidism, unspecified: E03.9

## 2011-08-14 HISTORY — DX: Gastro-esophageal reflux disease without esophagitis: K21.9

## 2011-08-14 HISTORY — DX: Unspecified macular degeneration: H35.30

## 2011-08-14 LAB — CBC
HCT: 35.5 % — ABNORMAL LOW (ref 39.0–52.0)
Hemoglobin: 11.8 g/dL — ABNORMAL LOW (ref 13.0–17.0)
MCHC: 33.2 g/dL (ref 30.0–36.0)
RBC: 3.54 MIL/uL — ABNORMAL LOW (ref 4.22–5.81)

## 2011-08-14 LAB — POCT I-STAT, CHEM 8
BUN: 22 mg/dL (ref 6–23)
Chloride: 106 mEq/L (ref 96–112)
Potassium: 4.3 mEq/L (ref 3.5–5.1)
Sodium: 140 mEq/L (ref 135–145)

## 2011-08-14 LAB — DIFFERENTIAL
Lymphocytes Relative: 20 % (ref 12–46)
Lymphs Abs: 1.3 10*3/uL (ref 0.7–4.0)
Monocytes Absolute: 1.1 10*3/uL — ABNORMAL HIGH (ref 0.1–1.0)
Monocytes Relative: 16 % — ABNORMAL HIGH (ref 3–12)
Neutro Abs: 3.4 10*3/uL (ref 1.7–7.7)
Neutrophils Relative %: 51 % (ref 43–77)

## 2011-08-14 LAB — PROTIME-INR: INR: 1.13 (ref 0.00–1.49)

## 2011-08-14 LAB — POCT I-STAT TROPONIN I: Troponin i, poc: 0 ng/mL (ref 0.00–0.08)

## 2011-08-14 MED ORDER — SODIUM CHLORIDE 0.9 % IV SOLN
Freq: Once | INTRAVENOUS | Status: AC
  Administered 2011-08-14: via INTRAVENOUS

## 2011-08-14 NOTE — ED Provider Notes (Signed)
History     CSN: [id number]  Arrival date & time 08/14/11  2218   First MD Initiated Contact with Patient 08/14/11 2310      Chief Complaint  Patient presents with  . Chest Pain    (Consider location/radiation/quality/duration/timing/severity/associated sxs/prior treatment) HPI Comments: Pt is an elderly male with hx of CAD s/p stenting X 2 who presents with CP that has been intermittent all week - is a pressure is radiating to the R arm and is not associated with SOB.   He has been scheduled for stress test to be done tomorrow afternoon by Dr. Nicki Guadalajara.  Sx are currently resolved - he states it  Is not exertional and no positional - no coughing, no n/v/f/c/diaphoresis.  He is on plavix.  Patient is a 78 y.o. male presenting with chest pain. The history is provided by the patient, medical records and the spouse.  Chest Pain     Past Medical History  Diagnosis Date  . Coronary heart disease   . Idiopathic thrombocytopenic purpura   . Allergic rhinitis   . Sarcoidosis     Past Surgical History  Procedure Date  . Mediastinoscopy     for dx sarcoid  . Coronary angioplasty with stent placement     Family History  Problem Relation Age of Onset  . Heart disease Father   . Stroke Mother     History  Substance Use Topics  . Smoking status: Never Smoker   . Smokeless tobacco: Not on file  . Alcohol Use: No      Review of Systems  Cardiovascular: Positive for chest pain.  All other systems reviewed and are negative.    Allergies  Acetaminophen; Aspirin-caffeine; Atenolol; Carisoprodol; Diltiazem hcl; Doxazosin mesylate; Guaifenesin; Isosorbide mononitrate; Metoprolol tartrate; Misoprostol; Morphine; Nifedipine; Penicillins; and Sulfonamide derivatives  Home Medications   Current Outpatient Rx  Name Route Sig Dispense Refill  . ASPIRIN 81 MG PO TABS Oral Take 81 mg by mouth daily.      Marland Kitchen CLOPIDOGREL BISULFATE 75 MG PO TABS Oral Take 75 mg by mouth daily.       Marland Kitchen DM-GUAIFENESIN ER 30-600 MG PO TB12 Oral Take 1 tablet by mouth every 12 (twelve) hours.    . DORZOLAMIDE HCL 2 % OP SOLN  1 drop as directed.      Marland Kitchen EZETIMIBE 10 MG PO TABS Oral Take 10 mg by mouth daily.      Marland Kitchen FINASTERIDE 5 MG PO TABS Oral Take 5 mg by mouth daily.      Marland Kitchen HYDROCHLOROTHIAZIDE 12.5 MG PO CAPS Oral Take 12.5 mg by mouth daily as needed. For fluid retention    . ISOSORBIDE MONONITRATE ER 60 MG PO TB24 Oral Take 60 mg by mouth daily.      Marland Kitchen LEVOTHYROXINE SODIUM 88 MCG PO TABS Oral Take 88 mcg by mouth daily.      Marland Kitchen LOSARTAN POTASSIUM 100 MG PO TABS Oral Take 100 mg by mouth daily.      Marland Kitchen METOPROLOL SUCCINATE ER 25 MG PO TB24 Oral Take 12.5 mg by mouth 2 (two) times daily.     Marland Kitchen ONE-DAILY MULTI VITAMINS PO TABS Oral Take 1 tablet by mouth daily.      Marland Kitchen PRESERVISION/LUTEIN PO CAPS Oral Take 1 capsule by mouth daily.      Marland Kitchen NITROGLYCERIN 0.4 MG SL SUBL Sublingual Place 0.4 mg under the tongue every 5 (five) minutes as needed.    Marland Kitchen PANTOPRAZOLE SODIUM 40 MG  PO TBEC Oral Take 40 mg by mouth daily.      Marland Kitchen ROSUVASTATIN CALCIUM 5 MG PO TABS Oral Take 5 mg by mouth daily.      Marland Kitchen TIMOLOL MALEATE 0.5 % OP SOLN Both Eyes Place 1 drop into both eyes daily.      . TRAVOPROST 0.004 % OP SOLN  1 drop. Use as directed      . TRIAMCINOLONE ACETONIDE 0.1 % EX CREA Topical Apply topically 2 (two) times daily as needed.      Marland Kitchen VITAMIN C 500 MG PO TABS Oral Take 500 mg by mouth daily.      Marland Kitchen VITAMIN E 200 UNITS PO CAPS Oral Take 200 Units by mouth daily.        BP 159/72  Pulse 70  Temp(Src) 97.1 F (36.2 C) (Oral)  Resp 12  SpO2 100%  Physical Exam  Nursing note and vitals reviewed. Constitutional: He appears well-developed and well-nourished. No distress.  HENT:  Head: Normocephalic and atraumatic.  Mouth/Throat: Oropharynx is clear and moist. No oropharyngeal exudate.  Eyes: Conjunctivae and EOM are normal. Pupils are equal, round, and reactive to light. Right eye exhibits no  discharge. Left eye exhibits no discharge. No scleral icterus.  Neck: Normal range of motion. Neck supple. No JVD present. No thyromegaly present.  Cardiovascular: Normal rate, regular rhythm, normal heart sounds and intact distal pulses.  Exam reveals no gallop and no friction rub.   No murmur heard. Pulmonary/Chest: Effort normal and breath sounds normal. No respiratory distress. He has no wheezes. He has no rales.  Abdominal: Soft. Bowel sounds are normal. He exhibits no distension and no mass. There is no tenderness.  Musculoskeletal: Normal range of motion. He exhibits no edema and no tenderness.  Lymphadenopathy:    He has no cervical adenopathy.  Neurological: He is alert. Coordination normal.  Skin: Skin is warm and dry. No rash noted. No erythema.  Psychiatric: He has a normal mood and affect. His behavior is normal.    ED Course  Procedures (including critical care time)  ED ECG REPORT   Date: 08/15/2011   Rate: 70  Rhythm: normal sinus rhythm  QRS Axis: normal  Intervals: normal  ST/T Wave abnormalities: normal  Conduction Disutrbances:none  Narrative Interpretation:   Old EKG Reviewed: Compared with EKG from 09/06/2009, no significant changes other than increased rate   Labs Reviewed  CBC - Abnormal; Notable for the following:    RBC 3.54 (*)    Hemoglobin 11.8 (*)    HCT 35.5 (*)    MCV 100.3 (*)    All other components within normal limits  DIFFERENTIAL - Abnormal; Notable for the following:    Monocytes Relative 16 (*)    Monocytes Absolute 1.1 (*)    Eosinophils Relative 12 (*)    Eosinophils Absolute 0.8 (*)    All other components within normal limits  POCT I-STAT, CHEM 8 - Abnormal; Notable for the following:    Glucose, Bld 107 (*)    Hemoglobin 11.9 (*)    HCT 35.0 (*)    All other components within normal limits  APTT  PROTIME-INR  POCT I-STAT TROPONIN I   Dg Chest Port 1 View  08/15/2011  *RADIOLOGY REPORT*  Clinical Data: Chest pain.   PORTABLE CHEST - 1 VIEW  Comparison: 09/05/2009  Findings: 2.8 x 1.8 cm nodule projects over the right hilar region. This may represent a pulmonary mass.  CT recommended for further delineation.  Central  pulmonary vascular prominence.  No segmental infiltrate. No pneumothorax.  Heart size within normal limits.  IMPRESSION: Question pulmonary mass right hilar region.  CT chest recommended for further delineation.  Critical Value/emergent results were called by telephone at the time of interpretation on 04/11 /2013  at 12:05 a.m.  to  Dr. Hyacinth Meeker, who verbally acknowledged these results.  Original Report Authenticated By: Fuller Canada, M.D.     1. Unstable angina       MDM  Lungs and heart are clear, there is no signs of arrhythmia, no edema, lung sounds are clear and oxygen levels are 98%. EKG is nonischemic, patient has significant risk factor for ongoing coronary disease and obstruction and is scheduled for a stress test tomorrow, anticipate admission, labs pending   Troponin negative, vital signs remained fairly stable, no other signs of acute problems on his laboratory workup and chest x-ray with a possible pulmonary mass which is seen compared with prior x-rays. Care discussed with the [facility] hospitalist who will admit for the cardiology service. Patient stable at this time  Vida Roller, MD 08/15/11 [phone]

## 2011-08-14 NOTE — ED Notes (Signed)
Xray at [facility], pt alert, NAD, calm, interactive, skin W&D, resps e/u, speaking in clear complete sentences, wife at Bs, pt updated. Pending lab results.

## 2011-08-14 NOTE — ED Notes (Signed)
Pt started to have CP at home tonight all week, with pain down (R) arm.  Reports taking 3 SL nitro at home without relief.  Pt scheduled to have a stress test tomorrow.  Pt denies N/V/SOB/Diaphoresis.  Pt A/O x 4, no distress noted.

## 2011-08-14 NOTE — ED Notes (Signed)
EDP & lab at [facility]

## 2011-08-15 ENCOUNTER — Inpatient Hospital Stay (HOSPITAL_COMMUNITY): Payer: Medicare Other

## 2011-08-15 ENCOUNTER — Encounter (HOSPITAL_COMMUNITY): Payer: Self-pay | Admitting: Family Medicine

## 2011-08-15 DIAGNOSIS — I2510 Atherosclerotic heart disease of native coronary artery without angina pectoris: Secondary | ICD-10-CM | POA: Diagnosis not present

## 2011-08-15 DIAGNOSIS — R079 Chest pain, unspecified: Secondary | ICD-10-CM | POA: Diagnosis not present

## 2011-08-15 DIAGNOSIS — Z862 Personal history of diseases of the blood and blood-forming organs and certain disorders involving the immune mechanism: Secondary | ICD-10-CM

## 2011-08-15 DIAGNOSIS — H3530 Unspecified macular degeneration: Secondary | ICD-10-CM

## 2011-08-15 DIAGNOSIS — I10 Essential (primary) hypertension: Secondary | ICD-10-CM | POA: Diagnosis not present

## 2011-08-15 DIAGNOSIS — D869 Sarcoidosis, unspecified: Secondary | ICD-10-CM | POA: Diagnosis not present

## 2011-08-15 DIAGNOSIS — E785 Hyperlipidemia, unspecified: Secondary | ICD-10-CM | POA: Diagnosis not present

## 2011-08-15 LAB — BASIC METABOLIC PANEL
BUN: 17 mg/dL (ref 6–23)
CO2: 28 mEq/L (ref 19–32)
Calcium: 8.9 mg/dL (ref 8.4–10.5)
Chloride: 106 mEq/L (ref 96–112)
Creatinine, Ser: 0.65 mg/dL (ref 0.50–1.35)
Glucose, Bld: 101 mg/dL — ABNORMAL HIGH (ref 70–99)

## 2011-08-15 LAB — LIPID PANEL
Cholesterol: 107 mg/dL (ref 0–200)
HDL: 41 mg/dL (ref >39)
Triglycerides: 74 mg/dL (ref <150)

## 2011-08-15 LAB — CBC
HCT: 35.4 % — ABNORMAL LOW (ref 39.0–52.0)
MCV: 100.3 fL — ABNORMAL HIGH (ref 78.0–100.0)
RBC: 3.53 MIL/uL — ABNORMAL LOW (ref 4.22–5.81)
WBC: 6.6 10*3/uL (ref 4.0–10.5)

## 2011-08-15 LAB — PLATELET INHIBITION P2Y12: Platelet Function  P2Y12: 125 [PRU] — ABNORMAL LOW (ref 194–418)

## 2011-08-15 MED ORDER — CLOPIDOGREL BISULFATE 75 MG PO TABS
75.0000 mg | ORAL_TABLET | Freq: Every day | ORAL | Status: DC
  Administered 2011-08-16: 75 mg via ORAL
  Filled 2011-08-15 (×3): qty 1

## 2011-08-15 MED ORDER — TECHNETIUM TC 99M TETROFOSMIN IV KIT
10.0000 | PACK | Freq: Once | INTRAVENOUS | Status: AC | PRN
  Administered 2011-08-15: 10 via INTRAVENOUS

## 2011-08-15 MED ORDER — ALUM & MAG HYDROXIDE-SIMETH 200-200-20 MG/5ML PO SUSP: 30.0000 mL | Freq: Four times a day (QID) | ORAL | Status: DC | PRN

## 2011-08-15 MED ORDER — ENOXAPARIN SODIUM 40 MG/0.4ML ~~LOC~~ SOLN
40.0000 mg | Freq: Every day | SUBCUTANEOUS | Status: DC
  Administered 2011-08-15: 40 mg via SUBCUTANEOUS
  Filled 2011-08-15 (×2): qty 0.4

## 2011-08-15 MED ORDER — ASPIRIN 325 MG PO TABS
325.0000 mg | ORAL_TABLET | Freq: Every day | ORAL | Status: DC
  Administered 2011-08-15: 325 mg via ORAL
  Filled 2011-08-15 (×2): qty 1

## 2011-08-15 MED ORDER — PROSIGHT PO TABS
1.0000 | ORAL_TABLET | Freq: Every day | ORAL | Status: DC
  Filled 2011-08-15: qty 1

## 2011-08-15 MED ORDER — ONDANSETRON HCL 4 MG/2ML IJ SOLN
4.0000 mg | Freq: Four times a day (QID) | INTRAMUSCULAR | Status: DC | PRN
  Administered 2011-08-15: 4 mg via INTRAVENOUS
  Filled 2011-08-15: qty 2

## 2011-08-15 MED ORDER — TIMOLOL MALEATE 0.5 % OP SOLN
1.0000 [drp] | Freq: Every day | OPHTHALMIC | Status: DC
  Administered 2011-08-15: 1 [drp] via OPHTHALMIC
  Filled 2011-08-15 (×2): qty 5

## 2011-08-15 MED ORDER — POTASSIUM CHLORIDE IN NACL 20-0.9 MEQ/L-% IV SOLN
INTRAVENOUS | Status: DC
  Filled 2011-08-15 (×4): qty 1000

## 2011-08-15 MED ORDER — EZETIMIBE 10 MG PO TABS
10.0000 mg | ORAL_TABLET | Freq: Every day | ORAL | Status: DC
  Administered 2011-08-15: 10 mg via ORAL
  Filled 2011-08-15 (×2): qty 1

## 2011-08-15 MED ORDER — OCUVITE-LUTEIN PO CAPS
1.0000 | ORAL_CAPSULE | Freq: Every day | ORAL | Status: DC
  Filled 2011-08-15: qty 1

## 2011-08-15 MED ORDER — SODIUM CHLORIDE 0.9 % IV SOLN
Freq: Once | INTRAVENOUS | Status: AC
  Administered 2011-08-15: 13:00:00 via INTRAVENOUS

## 2011-08-15 MED ORDER — METOPROLOL SUCCINATE ER 25 MG PO TB24
25.0000 mg | ORAL_TABLET | Freq: Two times a day (BID) | ORAL | Status: DC
  Administered 2011-08-15: 25 mg via ORAL
  Filled 2011-08-15 (×3): qty 1

## 2011-08-15 MED ORDER — LOSARTAN POTASSIUM 50 MG PO TABS
100.0000 mg | ORAL_TABLET | Freq: Every day | ORAL | Status: DC
  Administered 2011-08-15: 100 mg via ORAL
  Filled 2011-08-15 (×2): qty 2

## 2011-08-15 MED ORDER — FINASTERIDE 5 MG PO TABS
5.0000 mg | ORAL_TABLET | Freq: Every day | ORAL | Status: DC
  Administered 2011-08-15: 5 mg via ORAL
  Filled 2011-08-15 (×2): qty 1

## 2011-08-15 MED ORDER — NITROGLYCERIN 0.4 MG SL SUBL: 0.4000 mg | SUBLINGUAL_TABLET | SUBLINGUAL | Status: DC | PRN

## 2011-08-15 MED ORDER — TRAVOPROST (BAK FREE) 0.004 % OP SOLN
1.0000 [drp] | Freq: Every day | OPHTHALMIC | Status: DC
  Administered 2011-08-15: 1 [drp] via OPHTHALMIC
  Filled 2011-08-15: qty 2.5

## 2011-08-15 MED ORDER — SODIUM CHLORIDE 0.9 % IJ SOLN
3.0000 mL | Freq: Two times a day (BID) | INTRAMUSCULAR | Status: DC
  Administered 2011-08-15 (×2): 3 mL via INTRAVENOUS

## 2011-08-15 MED ORDER — LEVOTHYROXINE SODIUM 88 MCG PO TABS
88.0000 ug | ORAL_TABLET | Freq: Every day | ORAL | Status: DC
  Administered 2011-08-16: 88 ug via ORAL
  Filled 2011-08-15 (×3): qty 1

## 2011-08-15 MED ORDER — DORZOLAMIDE HCL 2 % OP SOLN
1.0000 [drp] | Freq: Two times a day (BID) | OPHTHALMIC | Status: DC
  Administered 2011-08-15 (×3): 1 [drp] via OPHTHALMIC
  Filled 2011-08-15 (×2): qty 10

## 2011-08-15 MED ORDER — METOPROLOL SUCCINATE 12.5 MG HALF TABLET
12.5000 mg | ORAL_TABLET | Freq: Two times a day (BID) | ORAL | Status: DC
  Filled 2011-08-15 (×2): qty 1

## 2011-08-15 MED ORDER — PANTOPRAZOLE SODIUM 40 MG PO TBEC
40.0000 mg | DELAYED_RELEASE_TABLET | Freq: Every day | ORAL | Status: DC
  Filled 2011-08-15: qty 1

## 2011-08-15 MED ORDER — ISOSORBIDE MONONITRATE ER 60 MG PO TB24
60.0000 mg | ORAL_TABLET | Freq: Every day | ORAL | Status: DC
  Administered 2011-08-15: 60 mg via ORAL
  Filled 2011-08-15 (×2): qty 1

## 2011-08-15 MED ORDER — REGADENOSON 0.4 MG/5ML IV SOLN
0.4000 mg | Freq: Once | INTRAVENOUS | Status: AC
  Administered 2011-08-15: 0.4 mg via INTRAVENOUS
  Filled 2011-08-15: qty 5

## 2011-08-15 MED ORDER — TECHNETIUM TC 99M TETROFOSMIN IV KIT
30.0000 | PACK | Freq: Once | INTRAVENOUS | Status: AC | PRN
  Administered 2011-08-15: 30 via INTRAVENOUS

## 2011-08-15 MED ORDER — ONDANSETRON HCL 4 MG PO TABS: 4.0000 mg | ORAL_TABLET | Freq: Four times a day (QID) | ORAL | Status: DC | PRN

## 2011-08-15 NOTE — H&P (Addendum)
PCP:   Kirk Ruths, MD, MD  [facility], Dr. Tresa Endo  Chief Complaint:  Chest pains  HPI: This is 78 year old [name] who presents with complaint of chest pains. He has been having left-sided chest pains with increasing frequency over the past few weeks. Patient also states some of his discomfort is also centrally located. He denies any radiation. Pain is present at rest and with activity. It is relieved with nitroglycerin. He describes it as a chest pressure. He denies any shortness of breath, diaphoresis, nausea, vomiting, lower extremity edema, palpitations. He has seen his cardiologist and is scheduled for an outpatient stress test tomorrow. However, tonight he had chest pain right-sided and he came to the ER. History provided by the patient, family members are present at bedside.  Review of Systems:positives bolded  The patient denies anorexia, fever, weight loss,, vision loss, decreased hearing, hoarseness, chest pain, syncope, dyspnea on exertion, peripheral edema, balance deficits, hemoptysis, abdominal pain, melena, hematochezia, severe indigestion/heartburn, hematuria, incontinence, genital sores, muscle weakness, suspicious skin lesions, transient blindness, difficulty walking, depression, unusual weight change, abnormal bleeding, enlarged lymph nodes, angioedema, and breast masses.  Past Medical History: Past Medical History  Diagnosis Date  . Coronary heart disease   . Idiopathic thrombocytopenic purpura   . Allergic rhinitis   . Sarcoidosis   . Hyperlipidemia   . Hypertension   . Macular degeneration   . Hypothyroidism    Past Surgical History  Procedure Date  . Mediastinoscopy     for dx sarcoid  . Coronary angioplasty with stent placement     Medications: Prior to Admission medications   Medication Sig Start Date End Date Taking? Authorizing Provider  aspirin 81 MG tablet Take 81 mg by mouth daily.     Yes Historical Provider, MD   clopidogrel (PLAVIX) 75 MG tablet Take 75 mg by mouth daily.     Yes Historical Provider, MD  dextromethorphan-guaiFENesin (MUCINEX DM) 30-600 MG per 12 hr tablet Take 1 tablet by mouth every 12 (twelve) hours.   Yes Historical Provider, MD  dorzolamide (TRUSOPT) 2 % ophthalmic solution 1 drop as directed.     Yes Historical Provider, MD  ezetimibe (ZETIA) 10 MG tablet Take 10 mg by mouth daily.     Yes Historical Provider, MD  finasteride (PROSCAR) 5 MG tablet Take 5 mg by mouth daily.     Yes Historical Provider, MD  hydrochlorothiazide (MICROZIDE) 12.5 MG capsule Take 12.5 mg by mouth daily as needed. For fluid retention   Yes Historical Provider, MD  isosorbide mononitrate (IMDUR) 60 MG 24 hr tablet Take 60 mg by mouth daily.     Yes Historical Provider, MD  levothyroxine (SYNTHROID, LEVOTHROID) 88 MCG tablet Take 88 mcg by mouth daily.     Yes Historical Provider, MD  losartan (COZAAR) 100 MG tablet Take 100 mg by mouth daily.     Yes Historical Provider, MD  metoprolol succinate (TOPROL-XL) 25 MG 24 hr tablet Take 12.5 mg by mouth 2 (two) times daily.    Yes Historical Provider, MD  Multiple Vitamin (MULTIVITAMIN) tablet Take 1 tablet by mouth daily.     Yes Historical Provider, MD  Multiple Vitamins-Minerals (PRESERVISION/LUTEIN) CAPS Take 1 capsule by mouth daily.     Yes Historical Provider, MD  nitroGLYCERIN (NITROSTAT) 0.4 MG SL tablet Place 0.4 mg under the tongue every 5 (five) minutes as needed.   Yes Historical Provider, MD  pantoprazole (PROTONIX) 40 MG tablet Take 40 mg by mouth  daily.     Yes Historical Provider, MD  rosuvastatin (CRESTOR) 5 MG tablet Take 5 mg by mouth daily.     Yes Historical Provider, MD  timolol (TIMOPTIC) 0.5 % ophthalmic solution Place 1 drop into both eyes daily.     Yes Historical Provider, MD  travoprost, benzalkonium, (TRAVATAN) 0.004 % ophthalmic solution 1 drop. Use as directed     Yes Historical Provider, MD  triamcinolone (KENALOG) 0.1 % cream  Apply topically 2 (two) times daily as needed.     Yes Historical Provider, MD  vitamin C (ASCORBIC ACID) 500 MG tablet Take 500 mg by mouth daily.     Yes Historical Provider, MD  vitamin E 200 UNIT capsule Take 200 Units by mouth daily.     Yes Historical Provider, MD    Allergies:   Allergies  Allergen Reactions  . Hydrocodone   . Morphine   . Penicillins   . Sulfonamide Derivatives     Social History:  reports that he has never smoked. He does not have any smokeless tobacco history on file. He reports that he does not drink alcohol or use illicit drugs.  Family History: Family History  Problem Relation Age of Onset  . Heart disease Father   . Stroke Mother     Physical Exam: Filed Vitals:   08/15/11 0030 08/15/11 0039 08/15/11 0045 08/15/11 0100  BP: 159/72 159/72 165/70 160/Dale  Pulse: 66 70 70 72  Temp:      TempSrc:      Resp: 12 12 11 13   SpO2: 100% 100% 100% 100%    General:  Alert and oriented times three, well developed and nourished, no acute distress Eyes: PERRLA, pink conjunctiva, no scleral icterus ENT: Moist oral mucosa, neck supple, no thyromegaly Lungs: clear to ascultation, no wheeze, no crackles, no use of accessory muscles Cardiovascular: regular rate and rhythm, no regurgitation, no gallops, no murmurs. No carotid bruits, no JVD Abdomen: soft, positive BS, non-tender, non-distended, no organomegaly, not an acute abdomen GU: not examined Neuro: CN II - XII grossly intact, sensation intact Musculoskeletal: strength 5/5 all extremities, no clubbing, cyanosis or edema, no reproducible chest wall pain Skin: no rash, no subcutaneous crepitation, no decubitus Psych: appropriate patient   Labs on Admission:   [facility] 08/14/11 2338  NA 140  K 4.3  CL 106  CO2 --  GLUCOSE 107*  BUN 22  CREATININE 1.00  CALCIUM --  MG --  PHOS --  Results for VAISHNAV, DEMARTIN (MRN [id number]) as of 08/15/2011 01:20  Ref. Range 08/14/2011 23:36  Troponin i, poc  Latest Range: 0.00-0.08 ng/mL 0.00  No results found for this basename: AST:2,ALT:2,ALKPHOS:2,BILITOT:2,PROT:2,ALBUMIN:2 in the last 72 hours No results found for this basename: LIPASE:2,AMYLASE:2 in the last 72 hours  Basename 08/14/11 2338 08/14/11 2328  WBC -- 6.6  NEUTROABS -- 3.4  HGB 11.9* 11.8*  HCT 35.0* 35.5*  MCV -- 100.3*  PLT -- 221   No results found for this basename: CKTOTAL:3,CKMB:3,CKMBINDEX:3,TROPONINI:3 in the last 72 hours No components found with this basename: POCBNP:3 No results found for this basename: DDIMER:2 in the last 72 hours No results found for this basename: HGBA1C:2 in the last 72 hours No results found for this basename: CHOL:2,HDL:2,LDLCALC:2,TRIG:2,CHOLHDL:2,LDLDIRECT:2 in the last 72 hours No results found for this basename: TSH,T4TOTAL,FREET3,T3FREE,THYROIDAB in the last 72 hours No results found for this basename: VITAMINB12:2,FOLATE:2,FERRITIN:2,TIBC:2,IRON:2,RETICCTPCT:2 in the last 72 hours  Micro Results: No results found for this or any previous visit (from  the past 240 hour(s)).   Radiological Exams on Admission: Dg Chest Port 1 View  08/15/2011  *RADIOLOGY REPORT*  Clinical Data: Chest pain.  PORTABLE CHEST - 1 VIEW  Comparison: 09/05/2009  Findings: 2.8 x 1.8 cm nodule projects over the right hilar region. This may represent a pulmonary mass.  CT recommended for further delineation.  Central pulmonary vascular prominence.  No segmental infiltrate. No pneumothorax.  Heart size within normal limits.  IMPRESSION: Question pulmonary mass right hilar region.  CT chest recommended for further delineation.  Critical Value/emergent results were called by telephone at the time of interpretation on 04/11 /2013  at 12:05 a.m.  to  Dr. Hyacinth Meeker, who verbally acknowledged these results.  Original Report Authenticated By: Fuller Canada, M.D.    EKG: Normal sinus rhythm  Assessment/Plan Present on Admission:  .Chest pain coronary artery  disease Admit to telemetry Aspirin, beta blockers and statins ordered and. Continue all home medications Cycle cardiac enzymes Lipid panel in a.m. Consult [facility] in a.m. Abnormal chest x-ray Patient with history of sarcoidosis-hilar.This could represent scar tissue Will order a CT chest for better evaluation .HYPERCHOLESTEROLEMIA .HYPERTENSION .HYPOTHYROIDISM Stable resume home medications  DVT prophylaxis Team 8/Dr. Candie Mile, Dariella Gillihan 08/15/2011, 1:33 AM

## 2011-08-15 NOTE — ED Notes (Signed)
[address] unable to take report

## 2011-08-15 NOTE — Progress Notes (Signed)
Patient ID: Dale [name]  male  YNW:[id number]    DOB: [birth year]    DOA: 08/14/2011  PCP: Kirk Ruths, MD, MD  Subjective: Chest pain improving  Objective: Weight change:   Intake/Output Summary (Last 24 hours) at 08/15/11 1124 Last data filed at 08/15/11 1040  Gross per 24 hour  Intake    125 ml  Output   1526 ml  Net  -1401 ml   Blood pressure 162/72, pulse 72, temperature 97.7 F (36.5 C), temperature source Oral, resp. rate 18, height 5\' 10"  (1.778 m), weight 61.689 kg (136 lb), SpO2 99.00%.  Physical Exam: General: Alert and awake, oriented x3, not in any acute distress. HEENT: anicteric sclera, pupils reactive to light and accommodation, EOMI CVS: S1-S2 clear, no murmur rubs or gallops Chest: clear to auscultation bilaterally, no wheezing, rales or rhonchi Abdomen: soft nontender, nondistended, normal bowel sounds, no organomegaly Extremities: no cyanosis, clubbing or edema noted bilaterally Neuro: Cranial nerves II-XII intact, no focal neurological deficits  Lab Results: Basic Metabolic Panel:  Lab 08/15/11 6578 08/14/11 2338  NA 139 140  K 3.7 4.3  CL 106 106  CO2 28 --  GLUCOSE 101* 107*  BUN 17 22  CREATININE 0.65 1.00  CALCIUM 8.9 --  MG -- --  PHOS -- --    CBC:  Lab 08/15/11 0308 08/14/11 2338 08/14/11 2328  WBC 6.6 -- 6.6  NEUTROABS -- -- 3.4  HGB 11.4* 11.9* --  HCT 35.4* 35.0* --  MCV 100.3* -- 100.3*  PLT 207 -- 221   Cardiac Enzymes:  Lab 08/15/11 0841 08/15/11 0308  CKTOTAL -- --  CKMB -- --  CKMBINDEX -- --  TROPONINI <0.30 <0.30    Studies/Results: Ct Chest W Contrast  08/15/2011  *RADIOLOGY REPORT*  Clinical Data: Question right hilar mass.  CT CHEST WITH CONTRAST  Technique:  Multidetector CT imaging of the chest was performed following the standard protocol during bolus administration of intravenous contrast.  Contrast:  80 ml Omnipaque-300.  Comparison: Chest radiograph 08/14/2011 and CT chest 04/23/2004.  Findings:  There are calcified mediastinal lymph nodes. Bihilar lymphoid tissue.  No axillary adenopathy.  Coronary artery calcification.  Heart size normal.  No pericardial effusion.  There is scattered peribronchovascular nodularity bilaterally, with confluent soft tissue masses in the perihilar regions, the latter of which are slightly smaller than on 04/23/2004.  Associated architectural distortion bilaterally.  No pleural fluid.  Airway is otherwise unremarkable.  Incidental imaging of the upper abdomen shows low attenuation lesions in the liver measuring up to 1.6 cm in the left hepatic lobe, slightly increased in size from 04/23/2004.  Cysts are favored.  A 9 mm nodule in the medial limb left adrenal gland is unchanged and previously characterized as an adenoma.  There is an 11 mm low attenuation lesion along the posterolateral aspect of the upper right kidney, which is not well correlated on the prior study.  Effects of volume averaging are considered.  Gastrohepatic ligament lymph nodes measure up to 12 mm, stable.  No worrisome lytic or sclerotic lesions.  Degenerative changes are seen in the spine.  IMPRESSION:  Mediastinal, hilar and pulmonary parenchymal changes of sarcoid. Findings account for the questioned abnormality on recent chest radiograph.  Original Report Authenticated By: Reyes Ivan, M.D.   Dg Chest Port 1 View  08/15/2011  *RADIOLOGY REPORT*  Clinical Data: Chest pain.  PORTABLE CHEST - 1 VIEW  Comparison: 09/05/2009  Findings: 2.8 x 1.8 cm nodule projects over the  right hilar region. This may represent a pulmonary mass.  CT recommended for further delineation.  Central pulmonary vascular prominence.  No segmental infiltrate. No pneumothorax.  Heart size within normal limits.  IMPRESSION: Question pulmonary mass right hilar region.  CT chest recommended for further delineation.  Critical Value/emergent results were called by telephone at the time of interpretation on 04/11 /2013  at 12:05  a.m.  to  Dr. Hyacinth Meeker, who verbally acknowledged these results.  Original Report Authenticated By: Fuller Canada, M.D.    Medications: Scheduled Meds:   . sodium chloride   Intravenous Once  . aspirin  325 mg Oral Daily  . clopidogrel  75 mg Oral Q breakfast  . dorzolamide  1 drop Both Eyes BID  . enoxaparin  40 mg Subcutaneous Daily  . ezetimibe  10 mg Oral Daily  . finasteride  5 mg Oral Daily  . isosorbide mononitrate  60 mg Oral Daily  . levothyroxine  88 mcg Oral Q breakfast  . losartan  100 mg Oral Daily  . metoprolol succinate  25 mg Oral BID  . multivitamin  1 tablet Oral Daily  . pantoprazole  40 mg Oral Daily  . sodium chloride  3 mL Intravenous Q12H  . timolol  1 drop Both Eyes Daily  . Travoprost (BAK Free)  1 drop Both Eyes QHS  . DISCONTD: metoprolol succinate  12.5 mg Oral BID  . DISCONTD: multivitamin-lutein  1 capsule Oral Daily   Continuous Infusions:   . 0.9 % NaCl with KCl 20 mEq / L       Assessment/Plan: Principal Problem:  *Chest pain: Concern for unstable angina, with a history of coronary artery sees, last cath May 2011, history of hypertension, hyperlipidemia - 2 sets chronic enzymes negative, [place] [facility] consulted, plan for lexiscan Myoview today - Continue aspirin, Plavix, beta blocker, ARB, statin  Active Problems: Pulmonary nodule/mass: - Patient has a history of biopsy-proven sarcoidosis, can be a scar tissue, ordered a CT of the chest for further delineation   HYPOTHYROIDISM: Continue Synthroid  HYPERCHOLESTEROLEMIA; cont statin  HYPERTENSION: BP somewhat uncontrolled, beta blocker uptitrated   Macular degeneration  H/O sarcoidosis  History of ITP  DVT Prophylaxis: Lovenox   Disposition: Await stress test, CT chest findings, observe overnight   LOS: 1 day   Dametri Ozburn M.D. [facility] 08/15/2011, 11:24 AM Pager: [phone]

## 2011-08-15 NOTE — Consult Note (Signed)
Reason for Consult: Chest Pain  Referring Physician:   LAMBERT [name] is an 78 y.o. male.  HPI:    The patient is an 78 yo male with a history of CAD with stent to the RCA in 2000 and cypher stent in 2009 beyond the acute margin of his RCA.  His last cath was May 2011 showed mild irregularity in the proximal LAD with an old 70% ostial narrowing in the small first diagonal branch, 30% mid LAD, 20% AV groove stenosis and the RCA stents were patent.  He also has a history of sarcoidosis, macular degeneration, ITP, hypothyroidism, HTN, and hyperlipidemia.  He was just seen by Dr. Tresa Endo on August 08, 2011 for chest pressure which started on March 15 which responded to three NTG SL.  He had recurrent CP that night and again took three nitro with response.  He was scheduled for a nuclear stress test today in our office.  He states he had recurrent CP yesterday which again responded to NTG.  He also had radiation to the right arm which persisted even after the CP resolved with NTG SL.  At worst the pain was 6-7/10 and resolved now.  He also reports nausea today but none prior and ankle edema for the last two weeks.  Denies vomiting, diaphoresis, palpitations, dizziness, orthopnea, PND, SOB abd pain.  CXR revealed a 2.8x1.8cm and the pt was taken for CT scan.  Troponin is negative and EKG shows NSR practically identical to previous. No acute changes.    Past Medical History  Diagnosis Date  . Coronary heart disease   . Idiopathic thrombocytopenic purpura   . Allergic rhinitis   . Sarcoidosis   . Hyperlipidemia   . Hypertension   . Macular degeneration   . Hypothyroidism     Past Surgical History  Procedure Date  . Mediastinoscopy     for dx sarcoid  . Coronary angioplasty with stent placement     Family History  Problem Relation Age of Onset  . Heart disease Father   . Stroke Mother     Social History:  reports that he has never smoked. He does not have any smokeless tobacco history on file. He  reports that he does not drink alcohol or use illicit drugs.  Allergies:  Allergies  Allergen Reactions  . Hydrocodone   . Morphine   . Penicillins   . Sulfonamide Derivatives     Medications:   Results for orders placed during the hospital encounter of 08/14/11 (from the past 48 hour(s))  CBC     Status: Abnormal   Collection Time   08/14/11 11:28 PM      Component Value Range Comment   WBC 6.6  4.0 - 10.5 (K/uL)    RBC 3.54 (*) 4.22 - 5.81 (MIL/uL)    Hemoglobin 11.8 (*) 13.0 - 17.0 (g/dL)    HCT 16.1 (*) 09.6 - 52.0 (%)    MCV 100.3 (*) 78.0 - 100.0 (fL)    MCH 33.3  26.0 - 34.0 (pg)    MCHC 33.2  30.0 - 36.0 (g/dL)    RDW 04.5  40.9 - 81.1 (%)    Platelets 221  150 - 400 (K/uL)   DIFFERENTIAL     Status: Abnormal   Collection Time   08/14/11 11:28 PM      Component Value Range Comment   Neutrophils Relative 51  43 - 77 (%)    Neutro Abs 3.4  1.7 - 7.7 (K/uL)  Lymphocytes Relative 20  12 - 46 (%)    Lymphs Abs 1.3  0.7 - 4.0 (K/uL)    Monocytes Relative 16 (*) 3 - 12 (%)    Monocytes Absolute 1.1 (*) 0.1 - 1.0 (K/uL)    Eosinophils Relative 12 (*) 0 - 5 (%)    Eosinophils Absolute 0.8 (*) 0.0 - 0.7 (K/uL)    Basophils Relative 1  0 - 1 (%)    Basophils Absolute 0.1  0.0 - 0.1 (K/uL)   APTT     Status: Normal   Collection Time   08/14/11 11:28 PM      Component Value Range Comment   aPTT 30  24 - 37 (seconds)   PROTIME-INR     Status: Normal   Collection Time   08/14/11 11:28 PM      Component Value Range Comment   Prothrombin Time 14.7  11.6 - 15.2 (seconds)    INR 1.13  0.00 - 1.49    POCT I-STAT TROPONIN I     Status: Normal   Collection Time   08/14/11 11:36 PM      Component Value Range Comment   Troponin i, poc 0.00  0.00 - 0.08 (ng/mL)    Comment 3            POCT I-STAT, CHEM 8     Status: Abnormal   Collection Time   08/14/11 11:38 PM      Component Value Range Comment   Sodium 140  135 - 145 (mEq/L)    Potassium 4.3  3.5 - 5.1 (mEq/L)     Chloride 106  96 - 112 (mEq/L)    BUN 22  6 - 23 (mg/dL)    Creatinine, Ser 4.54  0.50 - 1.35 (mg/dL)    Glucose, Bld 098 (*) 70 - 99 (mg/dL)    Calcium, Ion 1.19  1.12 - 1.32 (mmol/L)    TCO2 26  0 - 100 (mmol/L)    Hemoglobin 11.9 (*) 13.0 - 17.0 (g/dL)    HCT 14.7 (*) 82.9 - 52.0 (%)   TROPONIN I     Status: Normal   Collection Time   08/15/11  3:08 AM      Component Value Range Comment   Troponin I <0.30  <0.30 (ng/mL)   CBC     Status: Abnormal   Collection Time   08/15/11  3:08 AM      Component Value Range Comment   WBC 6.6  4.0 - 10.5 (K/uL)    RBC 3.53 (*) 4.22 - 5.81 (MIL/uL)    Hemoglobin 11.4 (*) 13.0 - 17.0 (g/dL)    HCT 56.2 (*) 13.0 - 52.0 (%)    MCV 100.3 (*) 78.0 - 100.0 (fL)    MCH 32.3  26.0 - 34.0 (pg)    MCHC 32.2  30.0 - 36.0 (g/dL)    RDW 86.5  78.4 - 69.6 (%)    Platelets 207  150 - 400 (K/uL)   BASIC METABOLIC PANEL     Status: Abnormal   Collection Time   08/15/11  3:08 AM      Component Value Range Comment   Sodium 139  135 - 145 (mEq/L)    Potassium 3.7  3.5 - 5.1 (mEq/L)    Chloride 106  96 - 112 (mEq/L)    CO2 28  19 - 32 (mEq/L)    Glucose, Bld 101 (*) 70 - 99 (mg/dL)    BUN 17  6 - 23 (mg/dL)  Creatinine, Ser 0.65  0.50 - 1.35 (mg/dL) DELTA CHECK NOTED   Calcium 8.9  8.4 - 10.5 (mg/dL)    GFR calc non Af Amer 89 (*) >90 (mL/min)    GFR calc Af Amer >90  >90 (mL/min)   LIPID PANEL     Status: Normal   Collection Time   08/15/11  5:55 AM      Component Value Range Comment   Cholesterol 107  0 - 200 (mg/dL)    Triglycerides 74  <454 (mg/dL)    HDL 41  >09 (mg/dL)    Total CHOL/HDL Ratio 2.6      VLDL 15  0 - 40 (mg/dL)    LDL Cholesterol 51  0 - 99 (mg/dL)   TROPONIN I     Status: Normal   Collection Time   08/15/11  8:41 AM      Component Value Range Comment   Troponin I <0.30  <0.30 (ng/mL)     Dg Chest Port 1 View  08/15/2011  *RADIOLOGY REPORT*  Clinical Data: Chest pain.  PORTABLE CHEST - 1 VIEW  Comparison: 09/05/2009   Findings: 2.8 x 1.8 cm nodule projects over the right hilar region. This may represent a pulmonary mass.  CT recommended for further delineation.  Central pulmonary vascular prominence.  No segmental infiltrate. No pneumothorax.  Heart size within normal limits.  IMPRESSION: Question pulmonary mass right hilar region.  CT chest recommended for further delineation.  Critical Value/emergent results were called by telephone at the time of interpretation on 04/11 /2013  at 12:05 a.m.  to  Dr. Hyacinth Meeker, who verbally acknowledged these results.  Original Report Authenticated By: Fuller Canada, M.D.    Review of Systems  Constitutional: Negative for fever, chills and diaphoresis.  HENT: Negative for congestion and sore throat.   Eyes: Negative for blurred vision.  Respiratory: Positive for shortness of breath (At night sometimes after coming back from the bathroom.). Negative for cough.   Cardiovascular: Positive for chest pain and leg swelling (In the ankles for the last two weeks.). Negative for palpitations, orthopnea and claudication.  Gastrointestinal: Positive for nausea (This AM but none prior). Negative for vomiting, abdominal pain, diarrhea, constipation, blood in stool and melena.  Genitourinary: Negative for dysuria and hematuria.  Musculoskeletal: Positive for myalgias (Right arm pain which coincided with CP.).  Neurological: Negative for dizziness, weakness and headaches.   Blood pressure 171/74, pulse 72, temperature 97.7 F (36.5 C), temperature source Oral, resp. rate 18, weight 61.689 kg (136 lb), SpO2 99.00%. Physical Exam  Constitutional: He is oriented to person, place, and time. He appears well-developed and well-nourished. No distress.  HENT:  Head: Normocephalic and atraumatic.  Eyes: EOM are normal. Pupils are equal, round, and reactive to light. Left eye exhibits no discharge. No scleral icterus.  Neck: No JVD present.  Cardiovascular: Normal rate and regular rhythm.   No  murmur heard. Pulses:      Radial pulses are 2+ on the right side, and 2+ on the left side.       Dorsalis pedis pulses are 2+ on the right side, and 2+ on the left side.       Posterior tibial pulses are 2+ on the right side, and 2+ on the left side.       No carotid or femoral bruits.  GI: Soft. Bowel sounds are normal. He exhibits no distension. There is no tenderness.  Musculoskeletal: He exhibits edema (1+ Posterior ankles).  Lymphadenopathy:  He has no cervical adenopathy.  Neurological: He is alert and oriented to person, place, and time. He exhibits normal muscle tone.  Skin: Skin is warm and dry.  Psychiatric: He has a normal mood and affect.    Assessment/Plan: Patient Active Hospital Problem List: Chest pain (08/15/2011)-Unstable angina CAD- last cath May 2011 HYPOTHYROIDISM (01/18/2009) HYPERCHOLESTEROLEMIA (01/18/2009) HYPERTENSION (01/18/2009) Macular degeneration (08/15/2011) H/O sarcoidosis (08/15/2011) History of ITP (08/15/2011)  Plan:  We will continue with plan for lexiscan myoview today.  BP 145/113 - 174/71.  Will titrate lopressor to 25bid.  Waiting for records from office to look at echo report.  Check P2Y12.    Zalmen Wrightsman W 08/15/2011, 9:53 AM

## 2011-08-15 NOTE — Progress Notes (Signed)
08/15/2011 Jaquesha Boroff SPARKS Case Management Note [phone]  Utilization review completed.  

## 2011-08-15 NOTE — ED Notes (Signed)
VSS, admitting MD at [facility], no changes.

## 2011-08-15 NOTE — ED Notes (Signed)
Report called to CN 2000. Pt states, he took ASA 81 mg at home Wednesday morning, but no other ASA given/taken. Family has gone home.

## 2011-08-15 NOTE — Consult Note (Signed)
Pt. Seen and examined. Agree with the NP/PA-C note as written.  78 yo male patient of Dr. Tresa Endo with a significant CAD history who has been having progressive stable angina symptoms. He was seen by Dr. Tresa Endo recently in the office and scheduled for a stress test today in the office - he has instead presented to the hospital with concerns for unstable angina. He has ruled-out for MI and had no further chest pain today. We have arranged an inpatient nuclear stress test. I will review today.  If low risk, he can be discharged. If abnormal, plan for LHC with Dr. Tresa Endo tomorrow.  Chrystie Nose, MD, [facility] Attending Cardiologist [facility]

## 2011-08-15 NOTE — Progress Notes (Signed)
We will take on our service. Corine Shelter PA-C 08/15/2011 1:15 PM

## 2011-08-16 DIAGNOSIS — I2510 Atherosclerotic heart disease of native coronary artery without angina pectoris: Secondary | ICD-10-CM | POA: Diagnosis not present

## 2011-08-16 DIAGNOSIS — E785 Hyperlipidemia, unspecified: Secondary | ICD-10-CM | POA: Diagnosis not present

## 2011-08-16 DIAGNOSIS — R079 Chest pain, unspecified: Secondary | ICD-10-CM | POA: Diagnosis not present

## 2011-08-16 DIAGNOSIS — I10 Essential (primary) hypertension: Secondary | ICD-10-CM | POA: Diagnosis not present

## 2011-08-16 NOTE — Discharge Summary (Signed)
Physician Discharge Summary  Patient ID: Dale [name] MRN: [id number] DOB/AGE: [birth year] 78 y.o.  Admit date: 08/14/2011 Discharge date: 08/16/2011  Primary Care Physician:  Kirk Ruths, MD, MD  Discharge Diagnoses:    . atypical Chest pain: Resolved  .HYPERCHOLESTEROLEMIA .HYPERTENSION .HYPOTHYROIDISM Pulmonary sarcoidosis  Consults:  Cardiology, Dr. Rennis Golden   Discharge Medications: Medication List  As of 08/16/2011  9:29 AM   TAKE these medications         aspirin 81 MG tablet   Take 81 mg by mouth daily.      clopidogrel 75 MG tablet   Commonly known as: PLAVIX   Take 75 mg by mouth daily.      dextromethorphan-guaiFENesin 30-600 MG per 12 hr tablet   Commonly known as: MUCINEX DM   Take 1 tablet by mouth every 12 (twelve) hours.      dorzolamide 2 % ophthalmic solution   Commonly known as: TRUSOPT   1 drop as directed.      ezetimibe 10 MG tablet   Commonly known as: ZETIA   Take 10 mg by mouth daily.      finasteride 5 MG tablet   Commonly known as: PROSCAR   Take 5 mg by mouth daily.      hydrochlorothiazide 12.5 MG capsule   Commonly known as: MICROZIDE   Take 12.5 mg by mouth daily as needed. For fluid retention      isosorbide mononitrate 60 MG 24 hr tablet   Commonly known as: IMDUR   Take 60 mg by mouth daily.      levothyroxine 88 MCG tablet   Commonly known as: SYNTHROID, LEVOTHROID   Take 88 mcg by mouth daily.      losartan 100 MG tablet   Commonly known as: COZAAR   Take 100 mg by mouth daily.      metoprolol succinate 25 MG 24 hr tablet   Commonly known as: TOPROL-XL   Take 12.5 mg by mouth 2 (two) times daily.      multivitamin tablet   Take 1 tablet by mouth daily.      nitroGLYCERIN 0.4 MG SL tablet   Commonly known as: NITROSTAT   Place 0.4 mg under the tongue every 5 (five) minutes as needed.      pantoprazole 40 MG tablet   Commonly known as: PROTONIX   Take 40 mg by mouth daily.      PreserVision/Lutein Caps   Take 1 capsule by mouth daily.      rosuvastatin 5 MG tablet   Commonly known as: CRESTOR   Take 5 mg by mouth daily.      timolol 0.5 % ophthalmic solution   Commonly known as: TIMOPTIC   Place 1 drop into both eyes daily.      travoprost (benzalkonium) 0.004 % ophthalmic solution   Commonly known as: TRAVATAN   1 drop. Use as directed        triamcinolone cream 0.1 %   Commonly known as: KENALOG   Apply topically 2 (two) times daily as needed.      vitamin C 500 MG tablet   Commonly known as: ASCORBIC ACID   Take 500 mg by mouth daily.      vitamin E 200 UNIT capsule   Take 200 Units by mouth daily.             Brief H and P: For complete details please refer to admission H and P, but in brief patient is 78 year old  male who presented with complaints of chest pain. Patient was having left-sided chest with increasing frequency over the past few weeks, centrally located with no radiation present at rest and activity and relieved with nitroglycerin.  Hospital Course:  Chest pain: Patient was admitted with the concern for unstable angina, with a history of coronary artery sees, last cath May 2011 and history of hypertension, hyperlipidemia. Patient was admitted to telemetry monitored floor, serial cardiac enzymes were negative. [facility] radiology service was consulted. Patient underwent lexiscan Myoview was negative for any reversible ischemia. Patient was continued on aspirin, Plavix, beta blocker, ARB, statin   Pulmonary nodule/mass: As seen on the chest x-ray, patient has a history of biopsy-proven sarcoidosis, can be a scar tissue. CT of the chest was ordered and revealed mediastinal, hilar, pulmonary parenchymal changes of sarcoid and no pneumonia or any tumor.   HYPOTHYROIDISM: Patient was continued on Synthroid    Day of Discharge BP 113/61  Pulse 66  Temp(Src) 97.3 F (36.3 C) (Oral)  Resp 18  Ht 5\' 10"  (1.778 m)  Wt 61.689 kg (136 lb)  BMI 19.51 kg/m2   SpO2 100%  Physical Exam: General: Alert and awake oriented x3 not in any acute distress. HEENT: anicteric sclera, pupils reactive to light and accommodation CVS: S1-S2 clear no murmur rubs or gallops Chest: clear to auscultation bilaterally, no wheezing rales or rhonchi Abdomen: soft nontender, nondistended, normal bowel sounds, no organomegaly Extremities: no cyanosis, clubbing or edema noted bilaterally Neuro: Cranial nerves II-XII intact, no focal neurological deficits   The results of significant diagnostics from this hospitalization (including imaging, microbiology, ancillary and laboratory) are listed below for reference.    LAB RESULTS: Basic Metabolic Panel:  Lab 08/15/11 1610 08/14/11 2338  NA 139 140  K 3.7 4.3  CL 106 106  CO2 28 --  GLUCOSE 101* 107*  BUN 17 22  CREATININE 0.65 1.00  CALCIUM 8.9 --  MG -- --  PHOS -- --   CBC:  Lab 08/15/11 0308 08/14/11 2338 08/14/11 2328  WBC 6.6 -- 6.6  NEUTROABS -- -- 3.4  HGB 11.4* 11.9* --  HCT 35.4* 35.0* --  MCV 100.3* -- --  PLT 207 -- 221   Cardiac Enzymes:  Lab 08/15/11 1544 08/15/11 0841  CKTOTAL -- --  CKMB -- --  CKMBINDEX -- --  TROPONINI <0.30 <0.30   BNP: No components found with this basename: POCBNP:2 CBG: No results found for this basename: GLUCAP:2 in the last 168 hours  Significant Diagnostic Studies:  Ct Chest W Contrast  08/15/2011  *RADIOLOGY REPORT*  Clinical Data: Question right hilar mass.  CT CHEST WITH CONTRAST  Technique:  Multidetector CT imaging of the chest was performed following the standard protocol during bolus administration of intravenous contrast.  Contrast:  80 ml Omnipaque-300.  Comparison: Chest radiograph 08/14/2011 and CT chest 04/23/2004.  Findings: There are calcified mediastinal lymph nodes. Bihilar lymphoid tissue.  No axillary adenopathy.  Coronary artery calcification.  Heart size normal.  No pericardial effusion.  There is scattered peribronchovascular nodularity  bilaterally, with confluent soft tissue masses in the perihilar regions, the latter of which are slightly smaller than on 04/23/2004.  Associated architectural distortion bilaterally.  No pleural fluid.  Airway is otherwise unremarkable.  Incidental imaging of the upper abdomen shows low attenuation lesions in the liver measuring up to 1.6 cm in the left hepatic lobe, slightly increased in size from 04/23/2004.  Cysts are favored.  A 9 mm nodule in the medial limb  left adrenal gland is unchanged and previously characterized as an adenoma.  There is an 11 mm low attenuation lesion along the posterolateral aspect of the upper right kidney, which is not well correlated on the prior study.  Effects of volume averaging are considered.  Gastrohepatic ligament lymph nodes measure up to 12 mm, stable.  No worrisome lytic or sclerotic lesions.  Degenerative changes are seen in the spine.  IMPRESSION:  Mediastinal, hilar and pulmonary parenchymal changes of sarcoid. Findings account for the questioned abnormality on recent chest radiograph.  Original Report Authenticated By: Reyes Ivan, M.D.   Nm Myocar Multi W/spect W/wall Motion / Ef  08/15/2011  *RADIOLOGY REPORT*  Clinical Data:  78 year old male with chest pain.  MYOCARDIAL IMAGING WITH SPECT (REST AND PHARMACOLOGIC-STRESS) GATED LEFT VENTRICULAR WALL MOTION STUDY LEFT VENTRICULAR EJECTION FRACTION  Technique:  Resting myocardial SPECT imaging was initially performed after intravenous administration of radiopharmaceutical. Myocardial SPECT was subsequently performed after additional radiopharmaceutical injection during pharmacologic-stress supervised by the Cardiology staff.  Quantitative gated imaging was also performed to evaluate left ventricular wall motion, and estimate left ventricular ejection fraction.  Radiopharmaceutical:  Tc-23m Myoview at rest and during stress.  Comparison: none  Findings:  Technique: Study is adequate.  Perfusion:   There are no relative decreased counts on stress or rest to suggest reversible ischemia or infarction.  Wall motion:  No focal wall motion abnormality.  Normal contractility.  Left ventricular ejection fraction:  Calculated left ventricular ejection fraction =  greater than 70%  IMPRESSION:  1.  No reversible ischemia or infarction. 2.  Normal wall motion. 3.  Left ventricular ejection fraction equal to greater than 70%.  Original Report Authenticated By: Genevive Bi, M.D.   Dg Chest Port 1 View  08/15/2011  *RADIOLOGY REPORT*  Clinical Data: Chest pain.  PORTABLE CHEST - 1 VIEW  Comparison: 09/05/2009  Findings: 2.8 x 1.8 cm nodule projects over the right hilar region. This may represent a pulmonary mass.  CT recommended for further delineation.  Central pulmonary vascular prominence.  No segmental infiltrate. No pneumothorax.  Heart size within normal limits.  IMPRESSION: Question pulmonary mass right hilar region.  CT chest recommended for further delineation.  Critical Value/emergent results were called by telephone at the time of interpretation on 04/11 /2013  at 12:05 a.m.  to  Dr. Hyacinth Meeker, who verbally acknowledged these results.  Original Report Authenticated By: Fuller Canada, M.D.     Disposition and Follow-up: Discharge Orders    Future Appointments: Provider: Department: Dept Phone: Center:   12/27/2011 9:30 AM Waymon Budge, MD [facility] [phone] None     Future Orders Please Complete By Expires   Diet - low sodium heart healthy      Increase activity slowly          DISPOSITION: Home  DIET: Heart healthy  ACTIVITY: As tolerated   DISCHARGE FOLLOW-UP Follow-up Information    Follow up with Kirk Ruths, MD. Schedule an appointment as soon as possible for a visit in 10 days. (for hospital follow-up)          Time spent on Discharge: 45 minutes  Signed:  Kandra Graven M.D. [facility] 08/16/2011, 9:29 AM

## 2011-08-22 DIAGNOSIS — J309 Allergic rhinitis, unspecified: Secondary | ICD-10-CM | POA: Diagnosis not present

## 2011-08-29 DIAGNOSIS — J309 Allergic rhinitis, unspecified: Secondary | ICD-10-CM | POA: Diagnosis not present

## 2011-09-02 DIAGNOSIS — L538 Other specified erythematous conditions: Secondary | ICD-10-CM | POA: Diagnosis not present

## 2011-09-02 DIAGNOSIS — L570 Actinic keratosis: Secondary | ICD-10-CM | POA: Diagnosis not present

## 2011-09-02 DIAGNOSIS — C44319 Basal cell carcinoma of skin of other parts of face: Secondary | ICD-10-CM | POA: Diagnosis not present

## 2011-09-02 DIAGNOSIS — Z85828 Personal history of other malignant neoplasm of skin: Secondary | ICD-10-CM | POA: Diagnosis not present

## 2011-09-03 ENCOUNTER — Ambulatory Visit (INDEPENDENT_AMBULATORY_CARE_PROVIDER_SITE_OTHER): Payer: Medicare Other

## 2011-09-03 DIAGNOSIS — I2510 Atherosclerotic heart disease of native coronary artery without angina pectoris: Secondary | ICD-10-CM | POA: Diagnosis not present

## 2011-09-03 DIAGNOSIS — J309 Allergic rhinitis, unspecified: Secondary | ICD-10-CM | POA: Diagnosis not present

## 2011-09-03 DIAGNOSIS — E782 Mixed hyperlipidemia: Secondary | ICD-10-CM | POA: Diagnosis not present

## 2011-09-05 DIAGNOSIS — J309 Allergic rhinitis, unspecified: Secondary | ICD-10-CM | POA: Diagnosis not present

## 2011-09-12 DIAGNOSIS — J309 Allergic rhinitis, unspecified: Secondary | ICD-10-CM | POA: Diagnosis not present

## 2011-09-19 DIAGNOSIS — J309 Allergic rhinitis, unspecified: Secondary | ICD-10-CM | POA: Diagnosis not present

## 2011-09-27 DIAGNOSIS — J309 Allergic rhinitis, unspecified: Secondary | ICD-10-CM | POA: Diagnosis not present

## 2011-10-01 ENCOUNTER — Emergency Department (HOSPITAL_COMMUNITY)
Admission: EM | Admit: 2011-10-01 | Discharge: 2011-10-01 | Disposition: A | Discharge status: Home / Self Care | Payer: Medicare Other | Attending: Emergency Medicine | Admitting: Emergency Medicine

## 2011-10-01 ENCOUNTER — Emergency Department (HOSPITAL_COMMUNITY): Payer: Medicare Other

## 2011-10-01 ENCOUNTER — Encounter (HOSPITAL_COMMUNITY): Payer: Self-pay | Admitting: *Deleted

## 2011-10-01 DIAGNOSIS — E785 Hyperlipidemia, unspecified: Secondary | ICD-10-CM | POA: Diagnosis not present

## 2011-10-01 DIAGNOSIS — K219 Gastro-esophageal reflux disease without esophagitis: Secondary | ICD-10-CM | POA: Insufficient documentation

## 2011-10-01 DIAGNOSIS — R079 Chest pain, unspecified: Secondary | ICD-10-CM | POA: Insufficient documentation

## 2011-10-01 DIAGNOSIS — R918 Other nonspecific abnormal finding of lung field: Secondary | ICD-10-CM | POA: Diagnosis not present

## 2011-10-01 DIAGNOSIS — I10 Essential (primary) hypertension: Secondary | ICD-10-CM | POA: Diagnosis not present

## 2011-10-01 DIAGNOSIS — I4891 Unspecified atrial fibrillation: Secondary | ICD-10-CM | POA: Insufficient documentation

## 2011-10-01 DIAGNOSIS — J45909 Unspecified asthma, uncomplicated: Secondary | ICD-10-CM | POA: Diagnosis not present

## 2011-10-01 DIAGNOSIS — Z7982 Long term (current) use of aspirin: Secondary | ICD-10-CM | POA: Insufficient documentation

## 2011-10-01 DIAGNOSIS — J90 Pleural effusion, not elsewhere classified: Secondary | ICD-10-CM | POA: Diagnosis not present

## 2011-10-01 DIAGNOSIS — J948 Other specified pleural conditions: Secondary | ICD-10-CM

## 2011-10-01 DIAGNOSIS — J984 Other disorders of lung: Secondary | ICD-10-CM | POA: Diagnosis not present

## 2011-10-01 DIAGNOSIS — Z79899 Other long term (current) drug therapy: Secondary | ICD-10-CM | POA: Diagnosis not present

## 2011-10-01 DIAGNOSIS — I509 Heart failure, unspecified: Secondary | ICD-10-CM | POA: Insufficient documentation

## 2011-10-01 DIAGNOSIS — E039 Hypothyroidism, unspecified: Secondary | ICD-10-CM | POA: Diagnosis not present

## 2011-10-01 DIAGNOSIS — R112 Nausea with vomiting, unspecified: Secondary | ICD-10-CM | POA: Diagnosis not present

## 2011-10-01 DIAGNOSIS — M79609 Pain in unspecified limb: Secondary | ICD-10-CM | POA: Diagnosis not present

## 2011-10-01 DIAGNOSIS — D869 Sarcoidosis, unspecified: Secondary | ICD-10-CM | POA: Insufficient documentation

## 2011-10-01 HISTORY — DX: Heart failure, unspecified: I50.9

## 2011-10-01 HISTORY — DX: Malignant (primary) neoplasm, unspecified: C80.1

## 2011-10-01 LAB — CBC
HCT: 35.7 % — ABNORMAL LOW (ref 39.0–52.0)
Hemoglobin: 11.7 g/dL — ABNORMAL LOW (ref 13.0–17.0)
MCH: 32.5 pg (ref 26.0–34.0)
MCHC: 32.8 g/dL (ref 30.0–36.0)
MCV: 99.2 fL (ref 78.0–100.0)
RDW: 12.6 % (ref 11.5–15.5)

## 2011-10-01 LAB — BASIC METABOLIC PANEL
BUN: 14 mg/dL (ref 6–23)
Calcium: 9.1 mg/dL (ref 8.4–10.5)
Chloride: 101 mEq/L (ref 96–112)
Creatinine, Ser: 0.68 mg/dL (ref 0.50–1.35)
GFR calc Af Amer: >90 mL/min (ref >90)

## 2011-10-01 LAB — DIFFERENTIAL
Basophils Absolute: 0 10*3/uL (ref 0.0–0.1)
Basophils Relative: 1 % (ref 0–1)
Eosinophils Relative: 5 % (ref 0–5)
Monocytes Absolute: 1.2 10*3/uL — ABNORMAL HIGH (ref 0.1–1.0)
Monocytes Relative: 15 % — ABNORMAL HIGH (ref 3–12)
Neutro Abs: 5.5 10*3/uL (ref 1.7–7.7)

## 2011-10-01 LAB — D-DIMER, QUANTITATIVE: D-Dimer, Quant: 0.9 ug/mL-FEU — ABNORMAL HIGH (ref 0.00–0.48)

## 2011-10-01 LAB — PROTIME-INR: Prothrombin Time: 15.2 seconds (ref 11.6–15.2)

## 2011-10-01 MED ORDER — SODIUM CHLORIDE 0.9 % IV SOLN
Freq: Once | INTRAVENOUS | Status: AC
  Administered 2011-10-01: 150 mL/h via INTRAVENOUS

## 2011-10-01 MED ORDER — ASPIRIN 325 MG PO TABS
325.0000 mg | ORAL_TABLET | ORAL | Status: AC
  Administered 2011-10-01: 325 mg via ORAL
  Filled 2011-10-01: qty 1

## 2011-10-01 MED ORDER — NITROGLYCERIN 0.4 MG SL SUBL
0.4000 mg | SUBLINGUAL_TABLET | SUBLINGUAL | Status: DC | PRN
  Filled 2011-10-01: qty 25

## 2011-10-01 MED ORDER — IOHEXOL 350 MG/ML SOLN
100.0000 mL | Freq: Once | INTRAVENOUS | Status: AC | PRN
  Administered 2011-10-01: 100 mL via INTRAVENOUS

## 2011-10-01 NOTE — ED Notes (Addendum)
Patient report received from [place], [place]. Patient care assumed at this time. Patient offers no complaints at present. Waiting for attending MD to review results.

## 2011-10-01 NOTE — Discharge Instructions (Signed)
Pleural Effusion  The lining covering your lungs and the inside of your chest is called the pleura. Usually, the space between the 2 pleura contains no air and only a thin layer of fluid. A pleural effusion is an abnormal buildup of fluid in the pleural space.  Fluid gathers when there is increased pressure in the lung vessels. This forces fluids out of the lungs and into the pleural space. Vessels may also leak fluids when there are infections, such as pneumonia, or other causes of soreness and redness (inflammation). Fluids leak into the lungs when protein in the blood is low or when certain vessels (lymphatics) are blocked.  Finding a pleural effusion is important because it is usually caused by another disease. In order to treat a pleural effusion, your caregiver needs to find its cause. If left untreated, a large amount of fluid can build up and cause collapse of the lung.  CAUSES    Heart failure.   Infections (pneumonia, tuberculosis), pulmonary embolism, pulmonary infarction.   Cancer (primary lung and metastatic), asbestosis.   Liver failure (cirrhosis).   Nephrotic syndrome, peritoneal dialysis, kidney problems (uremia).   Collagen vascular disease (systemic lupus erythematosis, rheumatoid arthritis).   Injury (trauma) to the chest or rupture of the digestive tube (esophagus).   Material in the chest or pleural space (hemothorax, chylothorax).   Pancreatitis.   Surgery.   Drug reactions.  SYMPTOMS   A pleural effusion can decrease the amount of space available for breathing and make you short of breath. The fluid can become infected, which may cause pain and fever. Often, the pain is worse when taking a deep breath. The underlying disease (heart failure, pneumonia, blood clot, tuberculosis, cancer) may also cause symptoms.  DIAGNOSIS    Your caregiver can usually tell what is wrong by talking to you (taking a history), doing an exam, and taking a routine X-ray. If the X-ray shows fluid in your  chest, often fluid is removed from your chest with a needle for testing (diagnostic thoracentesis).   Sometimes, more specialized X-rays may be needed.   Sometimes, a small piece of tissue is removed and examined by a specialist (biopsy).  TREATMENT   Treatment varies based on what caused the pleural effusion. Treatments include:   Removing as much fluid as possible using a needle (thoracentesis) to improve the cough and shortness of breath. This is a simple procedure which can be done at bedside. The risks are bleeding, infection, collapse of a lung, or low blood pressure.   Placing a tube in the chest to drain the effusion (tube thoracostomy). This is often used when there is an infection in the fluid. This is a simple procedure which can often be done at bedside or in a clinic. The procedure may be painful. The risks are the same as using a needle to drain the fluid. The chest tube usually remains for a few days and is connected to suction to improve fluid drainage. The tube, after placement, usually does not cause much discomfort.   Surgical removal of fibrous debris in and around the pleural space (decortication). This may be done with a flexible telescope (thoracoscope) through a small or large cut (incision). This is helpful for patients who have fibrosis or scar tissue that prevents complete lung expansion. The risks are infection, blood loss, and side effects from general anesthesia.   Sometimes, a procedure called pleurodesis is done. A chest tube is placed and the fluid is drained. Next, an agent (  tetracycline, talc powder) is added to the pleural space. This causes the lung and chest wall to stick together (adhesion). This leaves no potential space for fluid to build up. The risks include infection, blood loss, and side effects from general anesthesia.   If the effusion is caused by infection, it may be treated with antibiotics and improve without draining.  HOME CARE INSTRUCTIONS    Take any  medicines exactly as prescribed.   Follow up with your caregiver as directed.   Monitor your exercise capacity (the amount of walking you can do before you get short of breath).   Do not smoke. Ask your caregiver for help quitting.  SEEK MEDICAL CARE IF:    Your exercise capacity seems to get worse or does not improve with time.   You do not recover from your illness.  SEEK IMMEDIATE MEDICAL CARE IF:    Shortness of breath or chest pain develops or gets worse.   You have an oral temperature above 102 F (38.9 C), not controlled by medicine.   You develop a new cough, especially if the mucus (phlegm) is discolored.  MAKE SURE YOU:    Understand these instructions.   Will watch your condition.   Will get help right away if you are not doing well or get worse.  Document Released: 04/22/2005 Document Revised: 04/11/2011 Document Reviewed: 12/12/2006  [employer] Patient Information 2012 [employer]

## 2011-10-01 NOTE — ED Provider Notes (Signed)
Reproducible well localized right lateral chest wall tenderness which is pleuritic but also positional and nonexertional. He has no cough fever shortness of breath. His pain is present for hours at a time for the last 3 days. He did however have an episode of nausea vomiting and brief syncope yesterday which was initially unwitnessed as his wife ran to get a wet washcloth when he started vomiting and when she returned found him unresponsive for several seconds before he woke up again. He had no change in speech or vision no lateralizing or focal neurologic symptoms with that and no shortness of breath. He only describes mild chest pain at that time. His chest pain is sharp stabbing well localized nonradiating and without associated symptoms other than the possible correlation with vomiting yesterday with brief syncope.  I saw and evaluated the patient, reviewed the resident's note and I agree with the findings and plan including ECG interpretation.  Hurman Horn, MD 10/02/11 [phone]

## 2011-10-01 NOTE — ED Provider Notes (Signed)
History     CSN: [id number]  Arrival date & time 10/01/11  0945   None     Chief Complaint  Patient presents with  . Chest Pain    (Consider location/radiation/quality/duration/timing/severity/associated sxs/prior treatment) Patient is a 78 y.o. male presenting with chest pain. The history is provided by the patient.  Chest Pain Episode onset: 2 days ago. Duration of episode(s) is 2 days. Chest pain occurs constantly. The chest pain is unchanged. The pain is associated with breathing. The severity of the pain is mild. The quality of the pain is described as aching. The pain radiates to the right arm. Chest pain is worsened by deep breathing. Primary symptoms include nausea and vomiting. Pertinent negatives for primary symptoms include no fever, no shortness of breath, no cough, no palpitations and no abdominal pain.  Pertinent negatives for associated symptoms include no numbness. He tried nitroglycerin for the symptoms. Risk factors: CAD s/p stent.     Past Medical History  Diagnosis Date  . Coronary heart disease   . Idiopathic thrombocytopenic purpura   . Allergic rhinitis   . Sarcoidosis   . Hyperlipidemia   . Hypertension   . Macular degeneration   . Hypothyroidism   . Chest pain   . GERD (gastroesophageal reflux disease)   . CHF (congestive heart failure)   . Cancer   . Asthma     Past Surgical History  Procedure Date  . Mediastinoscopy     for dx sarcoid  . Coronary angioplasty with stent placement   . Cardiac stents     Family History  Problem Relation Age of Onset  . Heart disease Father   . Stroke Mother     History  Substance Use Topics  . Smoking status: Never Smoker   . Smokeless tobacco: Never Used  . Alcohol Use: No      Review of Systems  Constitutional: Negative for fever.  HENT: Negative for rhinorrhea, drooling and neck pain.   Eyes: Negative for pain.  Respiratory: Negative for cough and shortness of breath.   Cardiovascular:  Positive for chest pain. Negative for palpitations and leg swelling.  Gastrointestinal: Positive for nausea and vomiting. Negative for abdominal pain and diarrhea.  Genitourinary: Negative for dysuria and hematuria.  Musculoskeletal: Negative for gait problem.  Skin: Negative for color change.  Neurological: Negative for numbness and headaches.  Hematological: Negative for adenopathy.  Psychiatric/Behavioral: Negative for behavioral problems.  All other systems reviewed and are negative.    Allergies  Hydrocodone; Morphine; Penicillins; and Sulfonamide derivatives  Home Medications   Current Outpatient Rx  Name Route Sig Dispense Refill  . ASPIRIN 81 MG PO TABS Oral Take 81 mg by mouth daily.      Marland Kitchen CLOPIDOGREL BISULFATE 75 MG PO TABS Oral Take 75 mg by mouth daily.      Marland Kitchen DM-GUAIFENESIN ER 30-600 MG PO TB12 Oral Take 1 tablet by mouth daily.     . DORZOLAMIDE HCL 2 % OP SOLN  1 drop as directed.     Marland Kitchen EZETIMIBE 10 MG PO TABS Oral Take 10 mg by mouth daily.      Marland Kitchen FINASTERIDE 5 MG PO TABS Oral Take 5 mg by mouth daily.      . ISOSORBIDE MONONITRATE ER 60 MG PO TB24 Oral Take 60 mg by mouth daily.      Marland Kitchen LATANOPROST 0.005 % OP SOLN Both Eyes Place 1 drop into both eyes at bedtime.    Marland Kitchen LEVOTHYROXINE SODIUM  88 MCG PO TABS Oral Take 88 mcg by mouth daily.      Marland Kitchen LOSARTAN POTASSIUM 100 MG PO TABS Oral Take 100 mg by mouth daily.      Marland Kitchen METOPROLOL SUCCINATE ER 25 MG PO TB24 Oral Take 12.5 mg by mouth 2 (two) times daily.     Marland Kitchen ONE-DAILY MULTI VITAMINS PO TABS Oral Take 1 tablet by mouth daily.      Marland Kitchen PRESERVISION/LUTEIN PO CAPS Oral Take 1 capsule by mouth daily.      Marland Kitchen NITROGLYCERIN 0.4 MG SL SUBL Sublingual Place 0.4 mg under the tongue every 5 (five) minutes as needed. For chest pain    . PANTOPRAZOLE SODIUM 40 MG PO TBEC Oral Take 40 mg by mouth daily.      Marland Kitchen PRESCRIPTION MEDICATION Injection Inject 1 each as directed once a week. Allergy shot= thursday    . ROSUVASTATIN CALCIUM 5  MG PO TABS Oral Take 5 mg by mouth daily.      Marland Kitchen TIMOLOL MALEATE 0.5 % OP SOLN Both Eyes Place 1 drop into both eyes daily.      . TRIAMCINOLONE ACETONIDE 0.1 % EX CREA Topical Apply topically 2 (two) times daily as needed.      Marland Kitchen VITAMIN C 500 MG PO TABS Oral Take 500 mg by mouth daily.      Marland Kitchen VITAMIN E 200 UNITS PO CAPS Oral Take 200 Units by mouth daily.        BP 121/56  Pulse 59  Temp(Src) 98.3 F (36.8 C) (Oral)  Resp 12  SpO2 98%  Physical Exam  Constitutional: He is oriented to person, place, and time. He appears well-developed and well-nourished.  HENT:  Head: Normocephalic and atraumatic.  Right Ear: External ear normal.  Left Ear: External ear normal.  Nose: Nose normal.  Mouth/Throat: Oropharynx is clear and moist. No oropharyngeal exudate.  Eyes: Conjunctivae and EOM are normal. Pupils are equal, round, and reactive to light.  Neck: Normal range of motion. Neck supple.  Cardiovascular: Regular rhythm, normal heart sounds and intact distal pulses.  Exam reveals no gallop and no friction rub.   No murmur heard.      Sinus bradycardia   Pulmonary/Chest: Effort normal and breath sounds normal. No respiratory distress. He has no wheezes. He exhibits tenderness (Well localized right lateral chest wall pain, reproducible with palpation).  Abdominal: Soft. Bowel sounds are normal. He exhibits no distension. There is no tenderness.  Musculoskeletal: Normal range of motion. He exhibits no edema and no tenderness.  Neurological: He is alert and oriented to person, place, and time.  Skin: Skin is warm and dry.  Psychiatric: He has a normal mood and affect. His behavior is normal.    ED Course  Procedures (including critical care time)  Labs Reviewed  CBC - Abnormal; Notable for the following:    RBC 3.60 (*)    Hemoglobin 11.7 (*)    HCT 35.7 (*)    All other components within normal limits  DIFFERENTIAL - Abnormal; Notable for the following:    Lymphocytes Relative 11  (*)    Monocytes Relative 15 (*)    Monocytes Absolute 1.2 (*)    All other components within normal limits  BASIC METABOLIC PANEL - Abnormal; Notable for the following:    Glucose, Bld 111 (*)    GFR calc non Af Amer 87 (*)    All other components within normal limits  D-DIMER, QUANTITATIVE - Abnormal; Notable for the  following:    D-Dimer, Quant 0.90 (*)    All other components within normal limits  PROTIME-INR  TROPONIN I   Ct Angio Chest W/cm &/or Wo Cm  10/01/2011  *RADIOLOGY REPORT*  Clinical Data: Right-sided chest pain for 3 days.  Rule out pulmonary embolism.  CT ANGIOGRAPHY CHEST  Technique:  Multidetector CT imaging of the chest using the standard protocol during bolus administration of intravenous contrast. Multiplanar reconstructed images including MIPs were obtained and reviewed to evaluate the vascular anatomy.  Contrast: OMNIPAQUE IOHEXOL 350 MG/ML SOLN  Comparison: Plain film of earlier today.  CT of 08/15/2011.  Findings: Lung windows demonstrate mild motion degradation, primarily inferiorly.  A small right-sided pneumothorax, approximately 5%.  Primarily identified anteriorly and medially.  Peribronchovascular nodularity is again identified.  More confluent opacities within the dependent upper lobes bilaterally are similar. No evidence of acute superimposed process.  Areas of upper lobe predominant architectural distortion.  Soft tissue windows:  The quality of this exam for evaluation of pulmonary embolism is good.  The primary limitation is the above described motion artifact. No evidence of pulmonary embolism.  Normal caliber thoracic aorta.  Mild cardiomegaly.  Small right pleural effusion is new.  Enlarged pulmonary arteries.  Outflow tract 3.3 cm.  Calcified mediastinal nodes.  9 mm right paratracheal node is unchanged.  Calcified subcarinal adenopathy.  Limited abdominal imaging demonstrates scattered hepatic low density lesions which are likely cysts.  Unchanged. No  acute osseous abnormality.  IMPRESSION: 1.   No evidence of pulmonary embolism.  Mildly motion degraded exam. 2.  Development of a right-sided hydropneumothorax, small volume. Critical test results telephoned to [facility], rn. at the time of interpretation at 12:38 p.m. on 10/01/2011. 3.  Findings of sarcoidosis, similar to on the prior exam.  No evidence of superimposed pneumonia or airspace disease. 4. Pulmonary artery enlargement suggests pulmonary arterial hypertension.  Original Report Authenticated By: Consuello Bossier, M.D.   Dg Chest Port 1 View  10/01/2011  *RADIOLOGY REPORT*  Clinical Data: Right chest pain, nausea, vomiting.  PORTABLE CHEST - 1 VIEW  Comparison: 08/14/2011  Findings: Patchy opacities in the right lung, likely areas of scarring, similar to prior CT. It is difficult to completely exclude acute pneumonia.  Recommend clinical correlation.  Left lung is clear.  Heart is normal size.  No effusions.  IMPRESSION: Chronic densities within the left mid and lower lung likely reflects scarring although exclusion of pneumonia is difficult. Recommend clinical correlation.  Original Report Authenticated By: Cyndie Chime, M.D.     1. Hydropneumothorax      Date: 10/01/2011  Rate: 55  Rhythm: sinus bradycardia  QRS Axis: left  Intervals: normal  ST/T Wave abnormalities: normal  Conduction Disutrbances:none  Narrative Interpretation:   Old EKG Reviewed: unchanged    MDM  5:03 PM 78 y.o. male w hx of CAD s/p stent x2 on plavix pw right sided cp radiating to his right arm for the last 2 days. Pt notes relief w/ nitro 2 days ago. Pain worse w/ deep breathing, not positional. Pt notes he syncopized while vomiting yesterday. Pt AFVSS here, appears well on exam. Pt has well localized right lateral rib pain on exam. Will get labs, nitro.   Pt found to have elev d-dimer, will get CTA of chest to r/o PE.  Pt found to have hydropneumothorax on the right, approx 5-10%. I called C[facility] and  discussed the case w/ Dr. Laneta Simmers. As the pt continues to appear well and  denies cp or sob on exam. Will have pt f/u in Dr. Sharee Pimple clinic this week.   5:03 PM: Pt continues to appear well, is asx and would not like pain medicine. Will give incentive spirometer. I have discussed the diagnosis/risks/treatment options with the patient/spouse and believe the pt to be eligible for discharge home to follow-up with Dr. Laneta Simmers this week, call for appt. We also discussed returning to the ED immediately if new or worsening sx occur. We discussed the sx which are most concerning (e.g., worsening cp or sob) that necessitate immediate return. Any new prescriptions provided to the patient are listed below.  New Prescriptions   No medications on file   Clinical Impression 1. Hydropneumothorax        Purvis Sheffield, MD 10/01/11 1704

## 2011-10-01 NOTE — ED Notes (Signed)
Pt is here with right sided chest pain that started Sunday morning.  Non tender.  No shortness of breath.  Pt states hurts with movement.  Pt sts yesterday got sick, vomited and passed out .

## 2011-10-01 NOTE — ED Notes (Signed)
Radiology at bedside for portable chest x-ray.

## 2011-10-01 NOTE — ED Notes (Signed)
Resident [place] at bedside. 

## 2011-10-03 ENCOUNTER — Other Ambulatory Visit: Payer: Self-pay | Admitting: Surgery

## 2011-10-03 DIAGNOSIS — J90 Pleural effusion, not elsewhere classified: Secondary | ICD-10-CM

## 2011-10-03 DIAGNOSIS — J309 Allergic rhinitis, unspecified: Secondary | ICD-10-CM | POA: Diagnosis not present

## 2011-10-04 ENCOUNTER — Encounter: Payer: Self-pay | Admitting: Surgery

## 2011-10-04 ENCOUNTER — Ambulatory Visit
Admission: RE | Admit: 2011-10-04 | Discharge: 2011-10-04 | Disposition: A | Discharge status: Home / Self Care | Payer: Medicare Other | Source: Ambulatory Visit | Attending: Surgery | Admitting: Surgery

## 2011-10-04 ENCOUNTER — Ambulatory Visit (INDEPENDENT_AMBULATORY_CARE_PROVIDER_SITE_OTHER): Payer: Medicare Other | Admitting: Surgery

## 2011-10-04 VITALS — BP 149/64 | HR 63 | Resp 18 | Ht 69.0 in | Wt 134.0 lb

## 2011-10-04 DIAGNOSIS — J90 Pleural effusion, not elsewhere classified: Secondary | ICD-10-CM

## 2011-10-04 DIAGNOSIS — R079 Chest pain, unspecified: Secondary | ICD-10-CM | POA: Diagnosis not present

## 2011-10-04 DIAGNOSIS — D869 Sarcoidosis, unspecified: Secondary | ICD-10-CM | POA: Diagnosis not present

## 2011-10-04 DIAGNOSIS — R918 Other nonspecific abnormal finding of lung field: Secondary | ICD-10-CM | POA: Diagnosis not present

## 2011-10-04 DIAGNOSIS — J948 Other specified pleural conditions: Secondary | ICD-10-CM

## 2011-10-05 ENCOUNTER — Encounter: Payer: Self-pay | Admitting: Surgery

## 2011-10-05 NOTE — Progress Notes (Signed)
[address]            [place] [address]          [phone]      PCP is Kirk Ruths, MD, MD Referring Provider is Purvis Sheffield, MD  Chief Complaint  Patient presents with  . Chest Pain    F/U from ED on 10/01/11 with CXR , DX with hydropneumothorax on right side    HPI:  The patient is a 78 year old gentleman with a history of sarcoid who presented to the emergency room on 10/01/2011 with a two-day history of right-sided chest pain radiating to the right arm worsened by deep breathing. The pain was fairly mild but did not resolve and therefore he came to the emergency room. Chest x-ray showed patchy opacities in the right lung there were felt to be areas of scarring from his prior sarcoid. He had a CT scan the chest which showed a small 5% right-sided pneumothorax. The emergency room doctor called me since I was on call for cardiothoracic surgery and I told him that I did not think the patient required any intervention for a 5% pneumothorax. The patient was sent to my office today for followup. He did have a nuclear stress test in April 2013 that showed no ischemia.  Past Medical History  Diagnosis Date  . Coronary heart disease   . Idiopathic thrombocytopenic purpura   . Allergic rhinitis   . Sarcoidosis   . Hyperlipidemia   . Hypertension   . Macular degeneration   . Hypothyroidism   . Chest pain   . GERD (gastroesophageal reflux disease)   . CHF (congestive heart failure)   . Cancer   . Asthma     Past Surgical History  Procedure Date  . Mediastinoscopy     for dx sarcoid  . Coronary angioplasty with stent placement   . Cardiac stents     Family History  Problem Relation Age of Onset  . Heart disease Father   . Stroke Mother     Social History History  Substance Use Topics  . Smoking status: Never Smoker   . Smokeless tobacco: Never Used  . Alcohol Use: No    Current Outpatient Prescriptions  Medication  Sig Dispense Refill  . aspirin 81 MG tablet Take 81 mg by mouth daily.        . clopidogrel (PLAVIX) 75 MG tablet Take 75 mg by mouth daily.        Marland Kitchen dextromethorphan-guaiFENesin (MUCINEX DM) 30-600 MG per 12 hr tablet Take 1 tablet by mouth daily.       . dorzolamide (TRUSOPT) 2 % ophthalmic solution 1 drop as directed.       . ezetimibe (ZETIA) 10 MG tablet Take 10 mg by mouth daily.        . finasteride (PROSCAR) 5 MG tablet Take 5 mg by mouth daily.        . isosorbide mononitrate (IMDUR) 60 MG 24 hr tablet Take 60 mg by mouth daily.        Marland Kitchen latanoprost (XALATAN) 0.005 % ophthalmic solution Place 1 drop into both eyes at bedtime.      Marland Kitchen levothyroxine (SYNTHROID, LEVOTHROID) 88 MCG tablet Take 88 mcg by mouth daily.        Marland Kitchen losartan (COZAAR) 100 MG tablet Take 100 mg by mouth daily.        Marland Kitchen  metoprolol succinate (TOPROL-XL) 25 MG 24 hr tablet Take 12.5 mg by mouth 2 (two) times daily.       . Multiple Vitamin (MULTIVITAMIN) tablet Take 1 tablet by mouth daily.        . Multiple Vitamins-Minerals (PRESERVISION/LUTEIN) CAPS Take 1 capsule by mouth daily.       . nitroGLYCERIN (NITROSTAT) 0.4 MG SL tablet Place 0.4 mg under the tongue every 5 (five) minutes as needed. For chest pain      . pantoprazole (PROTONIX) 40 MG tablet Take 40 mg by mouth daily.        Marland Kitchen PRESCRIPTION MEDICATION Inject 1 each as directed once a week. Allergy shot= thursday      . rosuvastatin (CRESTOR) 5 MG tablet Take 5 mg by mouth daily.        . timolol (TIMOPTIC) 0.5 % ophthalmic solution Place 1 drop into both eyes daily.        Marland Kitchen triamcinolone (KENALOG) 0.1 % cream Apply topically 2 (two) times daily as needed.        . vitamin C (ASCORBIC ACID) 500 MG tablet Take 500 mg by mouth daily.        . vitamin E 200 UNIT capsule Take 200 Units by mouth daily.          Allergies  Allergen Reactions  . Hydrocodone Nausea And Vomiting  . Morphine     "made me crazy"  . Penicillins Nausea And Vomiting  . Sulfonamide  Derivatives Nausea And Vomiting    Review of Systems  Constitutional: Negative.   HENT: Negative.   Eyes: Negative.   Respiratory: Positive for chest tightness. Negative for cough.   Cardiovascular: Positive for chest pain.  Gastrointestinal: Positive for nausea and vomiting.  Genitourinary: Negative.   Musculoskeletal: Negative.   Skin: Negative.   Neurological: Negative.   Hematological: Negative.   Psychiatric/Behavioral: Negative.     BP 149/64  Pulse 63  Resp 18  Ht 5\' 9"  (1.753 m)  Wt 134 lb (60.782 kg)  BMI 19.79 kg/m2  SpO2 100% Physical Exam  Constitutional: He is oriented to person, place, and time. He appears well-developed and well-nourished. No distress.  HENT:  Head: Normocephalic and atraumatic.  Mouth/Throat: Oropharynx is clear and moist.  Eyes: Conjunctivae and EOM are normal. Pupils are equal, round, and reactive to light.  Neck: No JVD present. No tracheal deviation present. No thyromegaly present.  Cardiovascular: Normal rate, regular rhythm, normal heart sounds and intact distal pulses.  Exam reveals no gallop and no friction rub.   No murmur heard. Pulmonary/Chest: Effort normal and breath sounds normal. No stridor. No respiratory distress. He has no wheezes. He has no rales. He exhibits no tenderness.  Abdominal: Soft. Bowel sounds are normal. He exhibits no distension and no mass. There is no tenderness.  Musculoskeletal: Normal range of motion. He exhibits no edema.  Lymphadenopathy:    He has no cervical adenopathy.  Neurological: He is alert and oriented to person, place, and time. He has normal strength. No cranial nerve deficit or sensory deficit.  Skin: Skin is warm and dry.  Psychiatric: He has a normal mood and affect.     Diagnostic Tests:  Initial CXR  Clinical Data: Right chest pain, nausea, vomiting.   PORTABLE CHEST - 1 VIEW   Comparison: 08/14/2011   Findings: Patchy opacities in the right lung, likely areas of scarring,  similar to prior CT. It is difficult to completely exclude acute pneumonia.  Recommend clinical  correlation.  Left lung is clear.  Heart is normal size.  No effusions.   IMPRESSION: Chronic densities within the left mid and lower lung likely reflects scarring although exclusion of pneumonia is difficult. Recommend clinical correlation.   Original Report Authenticated By: Cyndie Chime, M.D.  Clinical Data: Right-sided chest pain for 3 days.  Rule out pulmonary embolism.   CT ANGIOGRAPHY CHEST   Technique:  Multidetector CT imaging of the chest using the standard protocol during bolus administration of intravenous contrast. Multiplanar reconstructed images including MIPs were obtained and reviewed to evaluate the vascular anatomy.   Contrast: OMNIPAQUE IOHEXOL 350 MG/ML SOLN   Comparison: Plain film of earlier today.  CT of 08/15/2011.   Findings: Lung windows demonstrate mild motion degradation, primarily inferiorly.  A small right-sided pneumothorax, approximately 5%.  Primarily identified anteriorly and medially.   Peribronchovascular nodularity is again identified.  More confluent opacities within the dependent upper lobes bilaterally are similar. No evidence of acute superimposed process.  Areas of upper lobe predominant architectural distortion.   Soft tissue windows:  The quality of this exam for evaluation of pulmonary embolism is good.  The primary limitation is the above described motion artifact. No evidence of pulmonary embolism.   Normal caliber thoracic aorta.  Mild cardiomegaly.  Small right pleural effusion is new.  Enlarged pulmonary arteries.  Outflow tract 3.3 cm.   Calcified mediastinal nodes.  9 mm right paratracheal node is unchanged.  Calcified subcarinal adenopathy.   Limited abdominal imaging demonstrates scattered hepatic low density lesions which are likely cysts.  Unchanged. No acute osseous abnormality.   IMPRESSION: 1.   No  evidence of pulmonary embolism.  Mildly motion degraded exam. 2.  Development of a right-sided hydropneumothorax, small volume. Critical test results telephoned to [facility], rn. at the time of interpretation at 12:38 p.m. on 10/01/2011. 3.  Findings of sarcoidosis, similar to on the prior exam.  No evidence of superimposed pneumonia or airspace disease. 4. Pulmonary artery enlargement suggests pulmonary arterial hypertension.   Original Report Authenticated By: Consuello Bossier, M.D.   Follow up CXR:  Clinical Data: Right-sided chest and back pain, also history of sarcoidosis   CHEST - 2 VIEW   Comparison: CT chest of 10/01/2011 and portable chest x-ray of 08/14/2011   Findings: Linear opacity along the lingula appears consistent with scarring when compared to the prior CT.  There is opacity overlying the right hilum which may be due to scarring and adenopathy in this patient with history sarcoidosis as well and appears unchanged compared to the prior CT.  No active process is seen.  The heart is within normal limits in size.  No bony abnormality is noted.   IMPRESSION: Stable changes of sarcoidosis as described previously.  No definite active process.   Original Report Authenticated By: Juline Patch, M.D.   Impression:  The patient presented with right-sided chest discomfort that was likely related to a small spontaneous right hydropneumothorax. This was not visible on chest x-ray but was seen on CT scan. I suspect that given his history of pulmonary sarcoid he likely had pleural adhesions and therefore only developed a small pneumothorax. It is not visible on his chest x-ray today and I don't think any further intervention is needed. His symptoms have continued to improve and I would expect them to completely resolve over the next few weeks.  Plan:  I told him to call my office if he develops any worsening chest discomfort or return of  his chest discomfort after it resolves  so that we can get him in the office and repeat his chest x-ray. Otherwise he'll followup with his primary physician.

## 2011-10-10 DIAGNOSIS — L259 Unspecified contact dermatitis, unspecified cause: Secondary | ICD-10-CM | POA: Diagnosis not present

## 2011-10-10 DIAGNOSIS — D235 Other benign neoplasm of skin of trunk: Secondary | ICD-10-CM | POA: Diagnosis not present

## 2011-10-10 DIAGNOSIS — L570 Actinic keratosis: Secondary | ICD-10-CM | POA: Diagnosis not present

## 2011-10-10 DIAGNOSIS — Z85828 Personal history of other malignant neoplasm of skin: Secondary | ICD-10-CM | POA: Diagnosis not present

## 2011-10-11 DIAGNOSIS — T7840XA Allergy, unspecified, initial encounter: Secondary | ICD-10-CM | POA: Diagnosis not present

## 2011-10-17 DIAGNOSIS — J309 Allergic rhinitis, unspecified: Secondary | ICD-10-CM | POA: Diagnosis not present

## 2011-10-24 ENCOUNTER — Telehealth: Payer: Self-pay | Admitting: Internal Medicine

## 2011-10-24 DIAGNOSIS — J309 Allergic rhinitis, unspecified: Secondary | ICD-10-CM | POA: Diagnosis not present

## 2011-10-24 MED ORDER — PANTOPRAZOLE SODIUM 40 MG PO TBEC: 40.0000 mg | DELAYED_RELEASE_TABLET | Freq: Every day | ORAL | Status: DC

## 2011-10-24 NOTE — Telephone Encounter (Signed)
Printed.  He can pick up or we can fax for him. Thanks

## 2011-10-24 NOTE — Telephone Encounter (Signed)
Routed to refill box 

## 2011-10-24 NOTE — Telephone Encounter (Signed)
[facility] faxed rx.

## 2011-10-24 NOTE — Telephone Encounter (Signed)
Pt is in waiting room. He brought [place] a note saying he needs refills on his pantoprazole 40 mg tabs. 90 day supply. Please advise

## 2011-10-24 NOTE — Telephone Encounter (Signed)
Pt needs a 90 day supply of pantoprazole 40 mg and he is using [facility] mail order.

## 2011-10-24 NOTE — Addendum Note (Signed)
Addended by: Joselyn Arrow on: 10/24/2011 01:13 PM   Modules accepted: Orders

## 2011-10-31 DIAGNOSIS — J309 Allergic rhinitis, unspecified: Secondary | ICD-10-CM | POA: Diagnosis not present

## 2011-11-04 DIAGNOSIS — N401 Enlarged prostate with lower urinary tract symptoms: Secondary | ICD-10-CM | POA: Diagnosis not present

## 2011-11-08 DIAGNOSIS — J309 Allergic rhinitis, unspecified: Secondary | ICD-10-CM | POA: Diagnosis not present

## 2011-11-14 DIAGNOSIS — J309 Allergic rhinitis, unspecified: Secondary | ICD-10-CM | POA: Diagnosis not present

## 2011-11-21 DIAGNOSIS — T7840XA Allergy, unspecified, initial encounter: Secondary | ICD-10-CM | POA: Diagnosis not present

## 2011-11-28 DIAGNOSIS — T7840XA Allergy, unspecified, initial encounter: Secondary | ICD-10-CM | POA: Diagnosis not present

## 2011-12-05 DIAGNOSIS — J309 Allergic rhinitis, unspecified: Secondary | ICD-10-CM | POA: Diagnosis not present

## 2011-12-12 DIAGNOSIS — T7840XA Allergy, unspecified, initial encounter: Secondary | ICD-10-CM | POA: Diagnosis not present

## 2011-12-19 DIAGNOSIS — J309 Allergic rhinitis, unspecified: Secondary | ICD-10-CM | POA: Diagnosis not present

## 2011-12-26 DIAGNOSIS — J309 Allergic rhinitis, unspecified: Secondary | ICD-10-CM | POA: Diagnosis not present

## 2011-12-27 ENCOUNTER — Ambulatory Visit: Payer: Medicare Other | Admitting: Internal Medicine

## 2011-12-31 DIAGNOSIS — H3530 Unspecified macular degeneration: Secondary | ICD-10-CM | POA: Diagnosis not present

## 2011-12-31 DIAGNOSIS — H40059 Ocular hypertension, unspecified eye: Secondary | ICD-10-CM | POA: Diagnosis not present

## 2012-01-02 DIAGNOSIS — J309 Allergic rhinitis, unspecified: Secondary | ICD-10-CM | POA: Diagnosis not present

## 2012-01-09 DIAGNOSIS — J309 Allergic rhinitis, unspecified: Secondary | ICD-10-CM | POA: Diagnosis not present

## 2012-01-15 ENCOUNTER — Ambulatory Visit (INDEPENDENT_AMBULATORY_CARE_PROVIDER_SITE_OTHER): Payer: Medicare Other

## 2012-01-15 DIAGNOSIS — J309 Allergic rhinitis, unspecified: Secondary | ICD-10-CM | POA: Diagnosis not present

## 2012-01-16 DIAGNOSIS — T7840XA Allergy, unspecified, initial encounter: Secondary | ICD-10-CM | POA: Diagnosis not present

## 2012-01-17 DIAGNOSIS — E039 Hypothyroidism, unspecified: Secondary | ICD-10-CM | POA: Diagnosis not present

## 2012-01-17 DIAGNOSIS — I2510 Atherosclerotic heart disease of native coronary artery without angina pectoris: Secondary | ICD-10-CM | POA: Diagnosis not present

## 2012-01-17 DIAGNOSIS — E782 Mixed hyperlipidemia: Secondary | ICD-10-CM | POA: Diagnosis not present

## 2012-01-17 DIAGNOSIS — R002 Palpitations: Secondary | ICD-10-CM | POA: Diagnosis not present

## 2012-01-17 DIAGNOSIS — R000 Tachycardia, unspecified: Secondary | ICD-10-CM | POA: Diagnosis not present

## 2012-01-20 DIAGNOSIS — E878 Other disorders of electrolyte and fluid balance, not elsewhere classified: Secondary | ICD-10-CM | POA: Diagnosis not present

## 2012-01-20 DIAGNOSIS — R5383 Other fatigue: Secondary | ICD-10-CM | POA: Diagnosis not present

## 2012-01-20 DIAGNOSIS — Z79899 Other long term (current) drug therapy: Secondary | ICD-10-CM | POA: Diagnosis not present

## 2012-01-20 DIAGNOSIS — R5381 Other malaise: Secondary | ICD-10-CM | POA: Diagnosis not present

## 2012-01-20 DIAGNOSIS — E782 Mixed hyperlipidemia: Secondary | ICD-10-CM | POA: Diagnosis not present

## 2012-01-23 DIAGNOSIS — T7840XA Allergy, unspecified, initial encounter: Secondary | ICD-10-CM | POA: Diagnosis not present

## 2012-01-28 DIAGNOSIS — I2510 Atherosclerotic heart disease of native coronary artery without angina pectoris: Secondary | ICD-10-CM | POA: Diagnosis not present

## 2012-01-28 DIAGNOSIS — R002 Palpitations: Secondary | ICD-10-CM | POA: Diagnosis not present

## 2012-01-30 DIAGNOSIS — J309 Allergic rhinitis, unspecified: Secondary | ICD-10-CM | POA: Diagnosis not present

## 2012-02-02 ENCOUNTER — Encounter (HOSPITAL_COMMUNITY)
Admission: EM | Disposition: A | Discharge status: Home / Self Care | Payer: Self-pay | Source: Home / Self Care | Attending: Emergency Medicine

## 2012-02-02 ENCOUNTER — Emergency Department (HOSPITAL_COMMUNITY): Payer: Medicare Other

## 2012-02-02 ENCOUNTER — Ambulatory Visit (HOSPITAL_COMMUNITY): Admit: 2012-02-02 | Payer: Self-pay | Admitting: Cardiovascular Disease

## 2012-02-02 ENCOUNTER — Observation Stay (HOSPITAL_COMMUNITY)
Admission: EM | Admit: 2012-02-02 | Discharge: 2012-02-03 | Disposition: A | Discharge status: Home / Self Care | Payer: Medicare Other | Attending: Internal Medicine | Admitting: Internal Medicine

## 2012-02-02 ENCOUNTER — Encounter (HOSPITAL_COMMUNITY): Payer: Self-pay | Admitting: *Deleted

## 2012-02-02 DIAGNOSIS — N200 Calculus of kidney: Secondary | ICD-10-CM

## 2012-02-02 DIAGNOSIS — K222 Esophageal obstruction: Secondary | ICD-10-CM

## 2012-02-02 DIAGNOSIS — J309 Allergic rhinitis, unspecified: Secondary | ICD-10-CM

## 2012-02-02 DIAGNOSIS — D696 Thrombocytopenia, unspecified: Secondary | ICD-10-CM | POA: Diagnosis present

## 2012-02-02 DIAGNOSIS — N400 Enlarged prostate without lower urinary tract symptoms: Secondary | ICD-10-CM

## 2012-02-02 DIAGNOSIS — R079 Chest pain, unspecified: Secondary | ICD-10-CM | POA: Diagnosis not present

## 2012-02-02 DIAGNOSIS — E039 Hypothyroidism, unspecified: Secondary | ICD-10-CM

## 2012-02-02 DIAGNOSIS — R61 Generalized hyperhidrosis: Secondary | ICD-10-CM | POA: Insufficient documentation

## 2012-02-02 DIAGNOSIS — K921 Melena: Secondary | ICD-10-CM

## 2012-02-02 DIAGNOSIS — R0602 Shortness of breath: Secondary | ICD-10-CM | POA: Insufficient documentation

## 2012-02-02 DIAGNOSIS — Z9861 Coronary angioplasty status: Secondary | ICD-10-CM | POA: Diagnosis not present

## 2012-02-02 DIAGNOSIS — R109 Unspecified abdominal pain: Secondary | ICD-10-CM

## 2012-02-02 DIAGNOSIS — K219 Gastro-esophageal reflux disease without esophagitis: Secondary | ICD-10-CM | POA: Diagnosis not present

## 2012-02-02 DIAGNOSIS — K602 Anal fissure, unspecified: Secondary | ICD-10-CM

## 2012-02-02 DIAGNOSIS — I2510 Atherosclerotic heart disease of native coronary artery without angina pectoris: Secondary | ICD-10-CM | POA: Diagnosis present

## 2012-02-02 DIAGNOSIS — E7800 Pure hypercholesterolemia, unspecified: Secondary | ICD-10-CM

## 2012-02-02 DIAGNOSIS — R0789 Other chest pain: Secondary | ICD-10-CM | POA: Diagnosis not present

## 2012-02-02 DIAGNOSIS — D693 Immune thrombocytopenic purpura: Secondary | ICD-10-CM | POA: Diagnosis not present

## 2012-02-02 DIAGNOSIS — I200 Unstable angina: Secondary | ICD-10-CM | POA: Diagnosis not present

## 2012-02-02 DIAGNOSIS — I509 Heart failure, unspecified: Secondary | ICD-10-CM

## 2012-02-02 DIAGNOSIS — D869 Sarcoidosis, unspecified: Secondary | ICD-10-CM

## 2012-02-02 DIAGNOSIS — H3530 Unspecified macular degeneration: Secondary | ICD-10-CM

## 2012-02-02 DIAGNOSIS — R1310 Dysphagia, unspecified: Secondary | ICD-10-CM

## 2012-02-02 DIAGNOSIS — R197 Diarrhea, unspecified: Secondary | ICD-10-CM

## 2012-02-02 DIAGNOSIS — I10 Essential (primary) hypertension: Secondary | ICD-10-CM

## 2012-02-02 DIAGNOSIS — Z8711 Personal history of peptic ulcer disease: Secondary | ICD-10-CM

## 2012-02-02 DIAGNOSIS — J45909 Unspecified asthma, uncomplicated: Secondary | ICD-10-CM

## 2012-02-02 DIAGNOSIS — Z862 Personal history of diseases of the blood and blood-forming organs and certain disorders involving the immune mechanism: Secondary | ICD-10-CM

## 2012-02-02 DIAGNOSIS — H409 Unspecified glaucoma: Secondary | ICD-10-CM

## 2012-02-02 HISTORY — PX: LEFT HEART CATH: SHX5478

## 2012-02-02 LAB — URINALYSIS, ROUTINE W REFLEX MICROSCOPIC
Bilirubin Urine: NEGATIVE
Hgb urine dipstick: NEGATIVE
Ketones, ur: NEGATIVE mg/dL
Specific Gravity, Urine: 1.022 (ref 1.005–1.030)
pH: 7.5 (ref 5.0–8.0)

## 2012-02-02 LAB — GLUCOSE, CAPILLARY: Glucose-Capillary: 93 mg/dL (ref 70–99)

## 2012-02-02 LAB — PROTIME-INR
INR: 1.06 (ref 0.00–1.49)
Prothrombin Time: 13.7 seconds (ref 11.6–15.2)

## 2012-02-02 LAB — BASIC METABOLIC PANEL
BUN: 17 mg/dL (ref 6–23)
CO2: 28 mEq/L (ref 19–32)
Calcium: 9.4 mg/dL (ref 8.4–10.5)
Chloride: 101 mEq/L (ref 96–112)
Creatinine, Ser: 0.81 mg/dL (ref 0.50–1.35)
GFR calc Af Amer: >90 mL/min (ref >90)

## 2012-02-02 LAB — D-DIMER, QUANTITATIVE: D-Dimer, Quant: 0.44 ug/mL-FEU (ref 0.00–0.48)

## 2012-02-02 LAB — CBC WITH DIFFERENTIAL/PLATELET
Basophils Relative: 0 % (ref 0–1)
Eosinophils Relative: 5 % (ref 0–5)
HCT: 35.1 % — ABNORMAL LOW (ref 39.0–52.0)
Hemoglobin: 12 g/dL — ABNORMAL LOW (ref 13.0–17.0)
MCHC: 34.2 g/dL (ref 30.0–36.0)
MCV: 97.2 fL (ref 78.0–100.0)
Monocytes Absolute: 1.4 10*3/uL — ABNORMAL HIGH (ref 0.1–1.0)
Monocytes Relative: 15 % — ABNORMAL HIGH (ref 3–12)
Neutro Abs: 6.7 10*3/uL (ref 1.7–7.7)
RDW: 13.6 % (ref 11.5–15.5)

## 2012-02-02 LAB — TROPONIN I: Troponin I: <0.3 ng/mL (ref <0.30)

## 2012-02-02 LAB — MRSA PCR SCREENING: MRSA by PCR: NEGATIVE

## 2012-02-02 SURGERY — LEFT HEART CATH
Anesthesia: Moderate Sedation

## 2012-02-02 MED ORDER — CLOPIDOGREL BISULFATE 75 MG PO TABS
75.0000 mg | ORAL_TABLET | Freq: Every day | ORAL | Status: DC
  Administered 2012-02-02 – 2012-02-03 (×2): 75 mg via ORAL
  Filled 2012-02-02 (×2): qty 1

## 2012-02-02 MED ORDER — ONDANSETRON HCL 4 MG/2ML IJ SOLN
4.0000 mg | Freq: Three times a day (TID) | INTRAMUSCULAR | Status: DC | PRN
  Filled 2012-02-02: qty 2

## 2012-02-02 MED ORDER — LOSARTAN POTASSIUM 50 MG PO TABS
100.0000 mg | ORAL_TABLET | Freq: Every day | ORAL | Status: DC
  Administered 2012-02-02 – 2012-02-03 (×2): 100 mg via ORAL
  Filled 2012-02-02 (×2): qty 2

## 2012-02-02 MED ORDER — LIDOCAINE HCL (PF) 1 % IJ SOLN
INTRAMUSCULAR | Status: AC
  Filled 2012-02-02: qty 30

## 2012-02-02 MED ORDER — TRAMADOL HCL 50 MG PO TABS
50.0000 mg | ORAL_TABLET | Freq: Four times a day (QID) | ORAL | Status: DC | PRN
  Filled 2012-02-02: qty 1

## 2012-02-02 MED ORDER — DORZOLAMIDE HCL 2 % OP SOLN
1.0000 [drp] | Freq: Two times a day (BID) | OPHTHALMIC | Status: DC
  Administered 2012-02-02 – 2012-02-03 (×3): 1 [drp] via OPHTHALMIC
  Filled 2012-02-02: qty 10

## 2012-02-02 MED ORDER — NITROGLYCERIN 0.4 MG SL SUBL: 0.4000 mg | SUBLINGUAL_TABLET | SUBLINGUAL | Status: DC | PRN

## 2012-02-02 MED ORDER — TIMOLOL MALEATE 0.5 % OP SOLN
1.0000 [drp] | Freq: Two times a day (BID) | OPHTHALMIC | Status: DC
  Administered 2012-02-02 – 2012-02-03 (×3): 1 [drp] via OPHTHALMIC
  Filled 2012-02-02: qty 5

## 2012-02-02 MED ORDER — PANTOPRAZOLE SODIUM 40 MG PO TBEC
40.0000 mg | DELAYED_RELEASE_TABLET | Freq: Every day | ORAL | Status: DC
  Administered 2012-02-02 – 2012-02-03 (×2): 40 mg via ORAL
  Filled 2012-02-02 (×2): qty 1

## 2012-02-02 MED ORDER — GI COCKTAIL ~~LOC~~
30.0000 mL | Freq: Once | ORAL | Status: AC
  Administered 2012-02-02: 30 mL via ORAL
  Filled 2012-02-02: qty 30

## 2012-02-02 MED ORDER — ACETAMINOPHEN 325 MG PO TABS: 650.0000 mg | ORAL_TABLET | ORAL | Status: DC | PRN

## 2012-02-02 MED ORDER — ASPIRIN 81 MG PO CHEW: 324.0000 mg | CHEWABLE_TABLET | ORAL | Status: DC

## 2012-02-02 MED ORDER — ISOSORBIDE MONONITRATE ER 60 MG PO TB24
60.0000 mg | ORAL_TABLET | Freq: Every day | ORAL | Status: DC
  Administered 2012-02-02: 60 mg via ORAL
  Filled 2012-02-02 (×2): qty 1

## 2012-02-02 MED ORDER — TRAVOPROST (BAK FREE) 0.004 % OP SOLN
1.0000 [drp] | Freq: Every day | OPHTHALMIC | Status: DC
  Administered 2012-02-02: 1 [drp] via OPHTHALMIC
  Filled 2012-02-02: qty 2.5

## 2012-02-02 MED ORDER — DM-GUAIFENESIN ER 30-600 MG PO TB12
1.0000 | ORAL_TABLET | Freq: Every day | ORAL | Status: DC
  Administered 2012-02-02 – 2012-02-03 (×2): via ORAL
  Filled 2012-02-02 (×2): qty 1

## 2012-02-02 MED ORDER — METOPROLOL SUCCINATE ER 25 MG PO TB24
25.0000 mg | ORAL_TABLET | Freq: Two times a day (BID) | ORAL | Status: DC
  Administered 2012-02-02: 25 mg via ORAL
  Filled 2012-02-02 (×4): qty 1

## 2012-02-02 MED ORDER — OXYCODONE HCL 5 MG PO TABS: 5.0000 mg | ORAL_TABLET | Freq: Four times a day (QID) | ORAL | Status: DC | PRN

## 2012-02-02 MED ORDER — ACETAMINOPHEN 325 MG PO TABS: 650.0000 mg | ORAL_TABLET | Freq: Four times a day (QID) | ORAL | Status: DC | PRN

## 2012-02-02 MED ORDER — ASPIRIN EC 325 MG PO TBEC
325.0000 mg | DELAYED_RELEASE_TABLET | Freq: Every day | ORAL | Status: DC
  Filled 2012-02-02: qty 1

## 2012-02-02 MED ORDER — SODIUM CHLORIDE 0.9 % IJ SOLN
3.0000 mL | Freq: Two times a day (BID) | INTRAMUSCULAR | Status: DC
  Administered 2012-02-02: 3 mL via INTRAVENOUS

## 2012-02-02 MED ORDER — ASPIRIN 81 MG PO CHEW
81.0000 mg | CHEWABLE_TABLET | Freq: Every day | ORAL | Status: DC
  Administered 2012-02-03: 81 mg via ORAL
  Filled 2012-02-02: qty 1

## 2012-02-02 MED ORDER — SODIUM CHLORIDE 0.9 % IV BOLUS (SEPSIS)
250.0000 mL | Freq: Once | INTRAVENOUS | Status: AC
  Administered 2012-02-02: 250 mL via INTRAVENOUS

## 2012-02-02 MED ORDER — TRAVOPROST 0.004 % OP SOLN: 1.0000 [drp] | Freq: Every day | OPHTHALMIC | Status: DC

## 2012-02-02 MED ORDER — SODIUM CHLORIDE 0.9 % IV SOLN
INTRAVENOUS | Status: AC
  Administered 2012-02-02: 13:00:00 via INTRAVENOUS

## 2012-02-02 MED ORDER — ACETAMINOPHEN 650 MG RE SUPP: 650.0000 mg | Freq: Four times a day (QID) | RECTAL | Status: DC | PRN

## 2012-02-02 MED ORDER — SODIUM CHLORIDE 0.9 % IV SOLN: INTRAVENOUS | Status: DC

## 2012-02-02 MED ORDER — ASPIRIN 81 MG PO CHEW: 162.0000 mg | CHEWABLE_TABLET | Freq: Once | ORAL | Status: DC

## 2012-02-02 MED ORDER — HYDROMORPHONE HCL PF 1 MG/ML IJ SOLN
0.5000 mg | INTRAMUSCULAR | Status: DC | PRN
  Administered 2012-02-02 (×2): 1 mg via INTRAVENOUS
  Filled 2012-02-02 (×2): qty 1

## 2012-02-02 MED ORDER — LEVOTHYROXINE SODIUM 88 MCG PO TABS
88.0000 ug | ORAL_TABLET | Freq: Every day | ORAL | Status: DC
  Administered 2012-02-03: 88 ug via ORAL
  Filled 2012-02-02 (×2): qty 1

## 2012-02-02 MED ORDER — VITAMIN C 500 MG PO TABS
500.0000 mg | ORAL_TABLET | Freq: Every day | ORAL | Status: DC
  Administered 2012-02-02 – 2012-02-03 (×2): 500 mg via ORAL
  Filled 2012-02-02 (×2): qty 1

## 2012-02-02 MED ORDER — SODIUM CHLORIDE 0.9 % IJ SOLN: 3.0000 mL | Freq: Two times a day (BID) | INTRAMUSCULAR | Status: DC

## 2012-02-02 MED ORDER — ALPRAZOLAM 0.25 MG PO TABS: 0.2500 mg | ORAL_TABLET | Freq: Three times a day (TID) | ORAL | Status: DC | PRN

## 2012-02-02 MED ORDER — SODIUM CHLORIDE 0.9 % IV SOLN
Freq: Once | INTRAVENOUS | Status: AC
  Administered 2012-02-02: 100 mL/h via INTRAVENOUS

## 2012-02-02 MED ORDER — LOSARTAN POTASSIUM 50 MG PO TABS: 100.0000 mg | ORAL_TABLET | Freq: Every day | ORAL | Status: DC

## 2012-02-02 MED ORDER — ATORVASTATIN CALCIUM 10 MG PO TABS
10.0000 mg | ORAL_TABLET | Freq: Every day | ORAL | Status: DC
Start: 2012-02-02 — End: 2012-02-03
  Administered 2012-02-02: 10 mg via ORAL
  Filled 2012-02-02 (×2): qty 1

## 2012-02-02 MED ORDER — EZETIMIBE 10 MG PO TABS
10.0000 mg | ORAL_TABLET | Freq: Every day | ORAL | Status: DC
  Administered 2012-02-02 – 2012-02-03 (×2): 10 mg via ORAL
  Filled 2012-02-02 (×2): qty 1

## 2012-02-02 MED ORDER — ZOLPIDEM TARTRATE 5 MG PO TABS: 5.0000 mg | ORAL_TABLET | Freq: Every evening | ORAL | Status: DC | PRN

## 2012-02-02 MED ORDER — PROMETHAZINE HCL 25 MG/ML IJ SOLN
12.5000 mg | Freq: Once | INTRAMUSCULAR | Status: AC
  Administered 2012-02-02: 12.5 mg via INTRAVENOUS
  Filled 2012-02-02: qty 1

## 2012-02-02 MED ORDER — HYDROCHLOROTHIAZIDE 12.5 MG PO CAPS
12.5000 mg | ORAL_CAPSULE | Freq: Every day | ORAL | Status: DC
  Administered 2012-02-02 – 2012-02-03 (×2): 12.5 mg via ORAL
  Filled 2012-02-02 (×2): qty 1

## 2012-02-02 MED ORDER — HEPARIN BOLUS VIA INFUSION
3500.0000 [IU] | Freq: Once | INTRAVENOUS | Status: AC
  Administered 2012-02-02: 3500 [IU] via INTRAVENOUS

## 2012-02-02 MED ORDER — NITROGLYCERIN 0.2 MG/ML ON CALL CATH LAB
INTRAVENOUS | Status: AC
  Filled 2012-02-02: qty 1

## 2012-02-02 MED ORDER — SODIUM CHLORIDE 0.9 % IJ SOLN: 3.0000 mL | INTRAMUSCULAR | Status: DC | PRN

## 2012-02-02 MED ORDER — HEPARIN (PORCINE) IN NACL 2-0.9 UNIT/ML-% IJ SOLN
INTRAMUSCULAR | Status: AC
  Filled 2012-02-02: qty 1000

## 2012-02-02 MED ORDER — NITROGLYCERIN IN D5W 200-5 MCG/ML-% IV SOLN
5.0000 ug/min | INTRAVENOUS | Status: DC
  Administered 2012-02-02: 15 ug/min via INTRAVENOUS
  Administered 2012-02-02: 20 ug/min via INTRAVENOUS
  Administered 2012-02-02: 5 ug/min via INTRAVENOUS
  Administered 2012-02-02: 25 ug/min via INTRAVENOUS
  Filled 2012-02-02: qty 250

## 2012-02-02 MED ORDER — ONDANSETRON HCL 4 MG PO TABS: 4.0000 mg | ORAL_TABLET | Freq: Four times a day (QID) | ORAL | Status: DC | PRN

## 2012-02-02 MED ORDER — FINASTERIDE 5 MG PO TABS
5.0000 mg | ORAL_TABLET | Freq: Every day | ORAL | Status: DC
  Administered 2012-02-02 – 2012-02-03 (×2): 5 mg via ORAL
  Filled 2012-02-02 (×2): qty 1

## 2012-02-02 MED ORDER — ONDANSETRON HCL 4 MG/2ML IJ SOLN
4.0000 mg | Freq: Once | INTRAMUSCULAR | Status: AC
  Administered 2012-02-02: 4 mg via INTRAVENOUS

## 2012-02-02 MED ORDER — HEPARIN (PORCINE) IN NACL 100-0.45 UNIT/ML-% IJ SOLN
750.0000 [IU]/h | INTRAMUSCULAR | Status: DC
  Administered 2012-02-02: 750 [IU]/h via INTRAVENOUS
  Filled 2012-02-02: qty 250

## 2012-02-02 MED ORDER — ONDANSETRON HCL 4 MG/2ML IJ SOLN: 4.0000 mg | Freq: Four times a day (QID) | INTRAMUSCULAR | Status: DC | PRN

## 2012-02-02 MED ORDER — DIAZEPAM 5 MG PO TABS: 5.0000 mg | ORAL_TABLET | ORAL | Status: DC

## 2012-02-02 MED ORDER — SODIUM CHLORIDE 0.9 % IV SOLN: 250.0000 mL | INTRAVENOUS | Status: DC | PRN

## 2012-02-02 MED ORDER — ENOXAPARIN SODIUM 40 MG/0.4ML ~~LOC~~ SOLN
40.0000 mg | SUBCUTANEOUS | Status: DC
  Administered 2012-02-03: 40 mg via SUBCUTANEOUS
  Filled 2012-02-02: qty 0.4

## 2012-02-02 MED ORDER — SODIUM CHLORIDE 0.9 % IV SOLN
INTRAVENOUS | Status: DC
  Administered 2012-02-02 – 2012-02-03 (×2): 50 mL/h via INTRAVENOUS

## 2012-02-02 MED ORDER — ONDANSETRON HCL 4 MG/2ML IJ SOLN
4.0000 mg | Freq: Four times a day (QID) | INTRAMUSCULAR | Status: DC | PRN
  Administered 2012-02-02: 4 mg via INTRAVENOUS
  Filled 2012-02-02: qty 2

## 2012-02-02 MED ORDER — ASPIRIN 81 MG PO TABS: 81.0000 mg | ORAL_TABLET | Freq: Every day | ORAL | Status: DC

## 2012-02-02 MED ORDER — NITROGLYCERIN 0.4 MG SL SUBL
0.4000 mg | SUBLINGUAL_TABLET | SUBLINGUAL | Status: DC | PRN
  Administered 2012-02-02: 0.4 mg via SUBLINGUAL
  Filled 2012-02-02 (×2): qty 25

## 2012-02-02 NOTE — Progress Notes (Signed)
Reason for Consult: Chest pain  Requesting Physician: [facility]  HPI: This is a 78 y.o. male with a past medical history significant for CAD. He had an RCA stent in 2000 and a DES in June 2009 to a new RCA site. His last cath was in 2011 and these sites were patent. He does have some residual Dx disease. He had chest pain in April 2013 and had a Myoview that was negative for ischemia. He is admitted now with chest pain. His symptoms are somewhat atypical for angina, they seem to be localized. His EKG shows slight inferior ST elevation. His Troponin is negative. He continues to have chest pain despite NTG and Heparin.  PMHx:  Past Medical History  Diagnosis Date  . Coronary heart disease   . Idiopathic thrombocytopenic purpura   . Allergic rhinitis   . Sarcoidosis   . Hyperlipidemia   . Hypertension   . Macular degeneration   . Hypothyroidism   . Chest pain   . GERD (gastroesophageal reflux disease)   . CHF (congestive heart failure)   . Cancer   . Asthma    Past Surgical History  Procedure Date  . Mediastinoscopy     for dx sarcoid  . Coronary angioplasty with stent placement   . Cardiac stents     FAMHx: Family History  Problem Relation Age of Onset  . Heart disease Father   . Stroke Mother     SOCHx:  reports that he has never smoked. He has never used smokeless tobacco. He reports that he does not drink alcohol or use illicit drugs.  ALLERGIES: Allergies  Allergen Reactions  . Hydrocodone Nausea And Vomiting  . Morphine     "made me crazy"  . Penicillins Nausea And Vomiting  . Sulfonamide Derivatives Nausea And Vomiting    ROS: Pertinent items are noted in HPI.  HOME MEDICATIONS: Prescriptions prior to admission  Medication Sig Dispense Refill  . aspirin 81 MG tablet Take 81 mg by mouth daily.        . clopidogrel (PLAVIX) 75 MG tablet Take 75 mg by mouth daily.       Marland Kitchen dextromethorphan-guaiFENesin (MUCINEX DM) 30-600 MG per 12 hr tablet Take 1  tablet by mouth daily.       . dorzolamide (TRUSOPT) 2 % ophthalmic solution Place 1 drop into both eyes 2 (two) times daily.       Marland Kitchen ezetimibe (ZETIA) 10 MG tablet Take 10 mg by mouth daily.        . finasteride (PROSCAR) 5 MG tablet Take 5 mg by mouth daily.        . hydrochlorothiazide (MICROZIDE) 12.5 MG capsule Take 12.5 mg by mouth daily.      . isosorbide mononitrate (IMDUR) 60 MG 24 hr tablet Take 60 mg by mouth daily.        Marland Kitchen levothyroxine (SYNTHROID, LEVOTHROID) 88 MCG tablet Take 88 mcg by mouth daily.        Marland Kitchen losartan (COZAAR) 100 MG tablet Take 100 mg by mouth daily.        . metoprolol succinate (TOPROL-XL) 25 MG 24 hr tablet Take 25 mg by mouth 2 (two) times daily.       . Multiple Vitamin (MULTIVITAMIN) tablet Take 1 tablet by mouth daily.        . Multiple Vitamins-Minerals (PRESERVISION/LUTEIN) CAPS Take 2 capsules by mouth daily.       . nitroGLYCERIN (NITROSTAT) 0.4 MG SL tablet Place 0.4 mg  under the tongue every 5 (five) minutes as needed. For chest pain      . pantoprazole (PROTONIX) 40 MG tablet Take 1 tablet (40 mg total) by mouth daily.  90 tablet  3  . PRESCRIPTION MEDICATION Inject 1 each as directed once a week. Allergy shot= thursday      . rosuvastatin (CRESTOR) 5 MG tablet Take 5 mg by mouth daily.        . timolol (TIMOPTIC) 0.5 % ophthalmic solution Place 1 drop into both eyes 2 (two) times daily.       . travoprost, benzalkonium, (TRAVATAN) 0.004 % ophthalmic solution Place 1 drop into both eyes at bedtime.      . triamcinolone (KENALOG) 0.1 % cream Apply topically 2 (two) times daily as needed.        . vitamin C (ASCORBIC ACID) 500 MG tablet Take 500 mg by mouth daily.        . vitamin E 200 UNIT capsule Take 200 Units by mouth daily.          HOSPITAL MEDICATIONS: I have reviewed the patient's current medications.  VITALS: Blood pressure 132/58, pulse 77, temperature 100.4 F (38 C), temperature source Oral, resp. rate 16, SpO2 100.00%.  PHYSICAL  EXAM: General appearance: alert, cooperative and no distress Neck: no JVD, supple, symmetrical, trachea midline, thyroid not enlarged, symmetric, no tenderness/mass/nodules and LCA bruit Lungs: clear to auscultation bilaterally Heart: regular rate and rhythm Abdomen: soft, non-tender; bowel sounds normal; no masses,  no organomegaly Extremities: extremities normal, atraumatic, no cyanosis or edema Pulses: 2+ and symmetric Skin: Skin color, texture, turgor normal. No rashes or lesions Neurologic: Grossly normal     LABS: Results for orders placed during the hospital encounter of 02/02/12 (from the past 48 hour(s))  GLUCOSE, CAPILLARY     Status: Normal   Collection Time   02/02/12  3:10 AM      Component Value Range Comment   Glucose-Capillary 93  70 - 99 mg/dL   CBC WITH DIFFERENTIAL     Status: Abnormal   Collection Time   02/02/12  3:46 AM      Component Value Range Comment   WBC 9.6  4.0 - 10.5 K/uL    RBC 3.61 (*) 4.22 - 5.81 MIL/uL    Hemoglobin 12.0 (*) 13.0 - 17.0 g/dL    HCT 08.6 (*) 57.8 - 52.0 %    MCV 97.2  78.0 - 100.0 fL    MCH 33.2  26.0 - 34.0 pg    MCHC 34.2  30.0 - 36.0 g/dL    RDW 46.9  62.9 - 52.8 %    Platelets 173  150 - 400 K/uL    Neutrophils Relative 70  43 - 77 %    Neutro Abs 6.7  1.7 - 7.7 K/uL    Lymphocytes Relative 11 (*) 12 - 46 %    Lymphs Abs 1.0  0.7 - 4.0 K/uL    Monocytes Relative 15 (*) 3 - 12 %    Monocytes Absolute 1.4 (*) 0.1 - 1.0 K/uL    Eosinophils Relative 5  0 - 5 %    Eosinophils Absolute 0.4  0.0 - 0.7 K/uL    Basophils Relative 0  0 - 1 %    Basophils Absolute 0.0  0.0 - 0.1 K/uL   BASIC METABOLIC PANEL     Status: Abnormal   Collection Time   02/02/12  3:46 AM      Component Value Range  Comment   Sodium 138  135 - 145 mEq/L    Potassium 4.0  3.5 - 5.1 mEq/L    Chloride 101  96 - 112 mEq/L    CO2 28  19 - 32 mEq/L    Glucose, Bld 130 (*) 70 - 99 mg/dL    BUN 17  6 - 23 mg/dL    Creatinine, Ser 4.09  0.50 - 1.35 mg/dL      Calcium 9.4  8.4 - 10.5 mg/dL    GFR calc non Af Amer 81 (*) >90 mL/min    GFR calc Af Amer >90  >90 mL/min   TROPONIN I     Status: Normal   Collection Time   02/02/12  3:50 AM      Component Value Range Comment   Troponin I <0.30  <0.30 ng/mL   URINALYSIS, ROUTINE W REFLEX MICROSCOPIC     Status: Abnormal   Collection Time   02/02/12  6:30 AM      Component Value Range Comment   Color, Urine YELLOW  YELLOW    APPearance CLOUDY (*) CLEAR    Specific Gravity, Urine 1.022  1.005 - 1.030    pH 7.5  5.0 - 8.0    Glucose, UA NEGATIVE  NEGATIVE mg/dL    Hgb urine dipstick NEGATIVE  NEGATIVE    Bilirubin Urine NEGATIVE  NEGATIVE    Ketones, ur NEGATIVE  NEGATIVE mg/dL    Protein, ur NEGATIVE  NEGATIVE mg/dL    Urobilinogen, UA 1.0  0.0 - 1.0 mg/dL    Nitrite NEGATIVE  NEGATIVE    Leukocytes, UA NEGATIVE  NEGATIVE MICROSCOPIC NOT DONE ON URINES WITH NEGATIVE PROTEIN, BLOOD, LEUKOCYTES, NITRITE, OR GLUCOSE <1000 mg/dL.  TROPONIN I     Status: Normal   Collection Time   02/02/12  6:55 AM      Component Value Range Comment   Troponin I <0.30  <0.30 ng/mL     IMAGING: Dg Chest Port 1 View  02/02/2012  *RADIOLOGY REPORT*  Clinical Data: Chest pain. History of sarcoidosis.  PORTABLE CHEST - 1 VIEW  Comparison: 10/03/2008  Findings: Single view of the chest was obtained.  Stable appearance of the heart and mediastinum.  There are chronic densities in the right hilum and right upper lung region.  Stable elevation of the medial right hemidiaphragm.  No evidence for pulmonary edema.  IMPRESSION: Stable parenchymal densities in the right lung and right hilar region.  Findings are most likely secondary to sarcoidosis.  No evidence for pulmonary edema.   Original Report Authenticated By: Richarda Overlie, M.D.     IMPRESSION: Principal Problem:  *Unstable angina Active Problems:  CAD, RCA stent, RCA new DES July 2009, Cath OK 2011, [place] negative April 2013  Sarcoidosis  HYPOTHYROIDISM   HYPERCHOLESTEROLEMIA  HYPERTENSION  GERD  ITP (idiopathic thrombocytopenic purpura) 2002  BPH (benign prostatic hyperplasia)   RECOMMENDATION: Pt seen by Dr Allyson Sabal and myself. He will take him to the cath lab.  Time Spent Directly with Patient: 40 minutes  KILROY,LUKE K 02/02/2012, 10:53 AM   Agree with note written by [facility]  Known CAD s/p RCA stenting in past by Drs Elsie Lincoln and Tresa Endo. Other probs as outlined. Last cath 2011 with patent stents and otherwise noncritical CAD. Admitted last PM with [place]. On IV hep/NTG. Exam benign. EKG with slight inferior ST elevation. Ongoing CP. Plan urgent cath to R./O ischemic etiology.   Runell Gess 02/02/2012 11:26 AM

## 2012-02-02 NOTE — Progress Notes (Signed)
Pt c/o nausea and very restless. Zofran 4mg  iv given, with relief, will cont to monitor closely.

## 2012-02-02 NOTE — Progress Notes (Signed)
ANTICOAGULATION CONSULT NOTE - Initial Consult  Pharmacy Consult for heparin Indication: chest pain/ACS  Allergies  Allergen Reactions  . Hydrocodone Nausea And Vomiting  . Morphine     "made me crazy"  . Penicillins Nausea And Vomiting  . Sulfonamide Derivatives Nausea And Vomiting    Patient Measurements: Weight: ~60kg  Vital Signs: Temp: 98.5 F (36.9 C) (09/29 0310) Temp src: Oral (09/29 0310) BP: 135/54 mmHg (09/29 0415) Pulse Rate: 69  (09/29 0415)  Labs:  Basename 02/02/12 0350 02/02/12 0346  HGB -- 12.0*  HCT -- 35.1*  PLT -- 173  APTT -- --  LABPROT -- --  INR -- --  HEPARINUNFRC -- --  CREATININE -- 0.81  CKTOTAL -- --  CKMB -- --  TROPONINI <0.30 --   Medical History: Past Medical History  Diagnosis Date  . Coronary heart disease   . Idiopathic thrombocytopenic purpura   . Allergic rhinitis   . Sarcoidosis   . Hyperlipidemia   . Hypertension   . Macular degeneration   . Hypothyroidism   . Chest pain   . GERD (gastroesophageal reflux disease)   . CHF (congestive heart failure)   . Cancer   . Asthma     Assessment: 78yo male c/o CP that began during the night, took 5 SL NTG without relief, initial CE negative, to begin heparin.  Goal of Therapy:  Heparin level 0.3-0.7 units/ml Monitor platelets by anticoagulation protocol: Yes   Plan:  Will give heparin bolus of 3500 units followed by gtt at 750 units/hr and monitor heparin levels and CBC.  Colleen Can PharmD BCPS 02/02/2012,5:11 AM

## 2012-02-02 NOTE — ED Notes (Signed)
Pt states that he is currently not having CP. Pt states that his chest when hurting does hurt on the left side then moves to the center chest. Pt states that the pain does not increase or decrease with movement but that it can start any time and then go away as well. Pt denies SOB, N/V and diaphoresis.

## 2012-02-02 NOTE — H&P (Addendum)
[facility] History and Physical  Dale [name] DOB: July 16, 1928 DOA: 02/02/2012  Referring physician: EDP PCP: Dale Ruths, MD   Chief Complaint: Chest Pain  HPI: Dale [name] is a 78 y.o. [name] with a history of CAD who presents to the ED with complaints of severe 8/10 chest pain that started at 11 pm.  The pain has been unremitting, and started in the left chest and moved into the center of his chest.  He reports having SOB and diaphoresis associated with the pain.  He was evaluated in the ED and found to have a negative Workup and no new EKG changes.  He was referred for medical admission.   Review of Systems: The patient denies anorexia, fever, weight loss, vision loss, decreased hearing, hoarseness, chest pain, syncope, dyspnea on exertion, peripheral edema, balance deficits, hemoptysis, abdominal pain, melena, hematochezia, severe indigestion/heartburn, hematuria, incontinence, genital sores, muscle weakness, suspicious skin lesions, transient blindness, difficulty walking, depression, unusual weight change, abnormal bleeding, enlarged lymph nodes, angioedema, and breast masses.    Past Medical History  Diagnosis Date  . Coronary heart disease   . Idiopathic thrombocytopenic purpura   . Allergic rhinitis   . Sarcoidosis   . Hyperlipidemia   . Hypertension   . Macular degeneration   . Hypothyroidism   . Chest pain   . GERD (gastroesophageal reflux disease)   . CHF (congestive heart failure)   . Cancer   . Asthma    Past Surgical History  Procedure Date  . Mediastinoscopy     for dx sarcoid  . Coronary angioplasty with stent placement   . Cardiac stents     Medications:  HOME MEDS: Prior to Admission medications   Medication Sig Start Date End Date Taking? Authorizing Provider  aspirin 81 MG tablet Take 81 mg by mouth daily.     Yes Historical Provider, MD  clopidogrel (PLAVIX) 75 MG tablet Take 75 mg by mouth daily.    Yes Historical  Provider, MD  dextromethorphan-guaiFENesin (MUCINEX DM) 30-600 MG per 12 hr tablet Take 1 tablet by mouth daily.    Yes Historical Provider, MD  dorzolamide (TRUSOPT) 2 % ophthalmic solution Place 1 drop into both eyes 2 (two) times daily.    Yes Historical Provider, MD  ezetimibe (ZETIA) 10 MG tablet Take 10 mg by mouth daily.     Yes Historical Provider, MD  finasteride (PROSCAR) 5 MG tablet Take 5 mg by mouth daily.     Yes Historical Provider, MD  hydrochlorothiazide (MICROZIDE) 12.5 MG capsule Take 12.5 mg by mouth daily.   Yes Historical Provider, MD  isosorbide mononitrate (IMDUR) 60 MG 24 hr tablet Take 60 mg by mouth daily.     Yes Historical Provider, MD  levothyroxine (SYNTHROID, LEVOTHROID) 88 MCG tablet Take 88 mcg by mouth daily.     Yes Historical Provider, MD  losartan (COZAAR) 100 MG tablet Take 100 mg by mouth daily.     Yes Historical Provider, MD  metoprolol succinate (TOPROL-XL) 25 MG 24 hr tablet Take 25 mg by mouth 2 (two) times daily.    Yes Historical Provider, MD  Multiple Vitamin (MULTIVITAMIN) tablet Take 1 tablet by mouth daily.     Yes Historical Provider, MD  Multiple Vitamins-Minerals (PRESERVISION/LUTEIN) CAPS Take 2 capsules by mouth daily.    Yes Historical Provider, MD  nitroGLYCERIN (NITROSTAT) 0.4 MG SL tablet Place 0.4 mg under the tongue every 5 (five) minutes as needed. For chest pain   Yes Historical  Provider, MD  pantoprazole (PROTONIX) 40 MG tablet Take 1 tablet (40 mg total) by mouth daily. 10/24/11  Yes Joselyn Arrow, NP  PRESCRIPTION MEDICATION Inject 1 each as directed once a week. Allergy shot= thursday   Yes Historical Provider, MD  rosuvastatin (CRESTOR) 5 MG tablet Take 5 mg by mouth daily.     Yes Historical Provider, MD  timolol (TIMOPTIC) 0.5 % ophthalmic solution Place 1 drop into both eyes 2 (two) times daily.    Yes Historical Provider, MD  travoprost, benzalkonium, (TRAVATAN) 0.004 % ophthalmic solution Place 1 drop into both eyes at  bedtime.   Yes Historical Provider, MD  triamcinolone (KENALOG) 0.1 % cream Apply topically 2 (two) times daily as needed.     Yes Historical Provider, MD  vitamin C (ASCORBIC ACID) 500 MG tablet Take 500 mg by mouth daily.     Yes Historical Provider, MD  vitamin E 200 UNIT capsule Take 200 Units by mouth daily.     Yes Historical Provider, MD     Allergies  Allergen Reactions  . Hydrocodone Nausea And Vomiting  . Morphine     "made me crazy"  . Penicillins Nausea And Vomiting  . Sulfonamide Derivatives Nausea And Vomiting     Social History:  reports that he has never smoked. He has never used smokeless tobacco. He reports that he does not drink alcohol or use illicit drugs.   Family History  Problem Relation Age of Onset  . Heart disease Father   . Stroke Mother      Physical Exam:  GEN:  [place] appearing Elderly 78 year old Caucasian [name]  examined  and in no acute distress; cooperative with exam Filed Vitals:   02/02/12 0306 02/02/12 0310 02/02/12 0345 02/02/12 0415  BP:  149/73 138/62 135/54  Pulse:  70 69 69  Temp:  98.5 F (36.9 C)    TempSrc:  Oral    Resp:  14 14 13   SpO2: 100% 100% 100% 100%   Blood pressure 135/54, pulse 69, temperature 98.5 F (36.9 C), temperature source Oral, resp. rate 13, SpO2 100.00%. PSYCH: He is alert and oriented x4; does not appear anxious does not appear depressed; affect is normal HEENT: Normocephalic and Atraumatic, Mucous membranes pink; PERRLA; EOM intact; Fundi:  Benign;  No scleral icterus, Nares: Patent, Oropharynx: Clear, Edentulous, Neck:  FROM, no cervical lymphadenopathy nor thyromegaly or carotid bruit; no JVD; Breasts:: Not examined CHEST WALL: No tenderness CHEST: Normal respiration, clear to auscultation bilaterally HEART: Regular rate and rhythm; no murmurs rubs or gallops BACK: No kyphosis or scoliosis; no CVA tenderness ABDOMEN: Positive Bowel Sounds, Scaphoid, soft non-tender; no masses, no  organomegaly, no pannus; no intertriginous candida. Rectal Exam: Not done EXTREMITIES: No bone or joint deformity; age-appropriate arthropathy of the hands and knees; no cyanosis, clubbing or edema; no ulcerations. Genitalia: not examined PULSES: 2+ and symmetric SKIN: Normal hydration no rash or ulceration CNS: Cranial nerves 2-12 grossly intact no focal neurologic deficit   Labs on Admission:  Basic Metabolic Panel:  Lab 02/02/12 4540  NA 138  K 4.0  CL 101  CO2 28  GLUCOSE 130*  BUN 17  CREATININE 0.81  CALCIUM 9.4  MG --  PHOS --   Liver Function Tests: No results found for this basename: AST:5,ALT:5,ALKPHOS:5,BILITOT:5,PROT:5,ALBUMIN:5 in the last 168 hours No results found for this basename: LIPASE:5,AMYLASE:5 in the last 168 hours No results found for this basename: AMMONIA:5 in the last 168 hours CBC:  Lab 02/02/12 0346  WBC 9.6  NEUTROABS 6.7  HGB 12.0*  HCT 35.1*  MCV 97.2  PLT 173   Cardiac Enzymes:  Lab 02/02/12 0350  CKTOTAL --  CKMB --  CKMBINDEX --  TROPONINI <0.30    BNP (last 3 results) No results found for this basename: PROBNP:3 in the last 8760 hours CBG:  Lab 02/02/12 0310  GLUCAP 93    Radiological Exams on Admission: Dg Chest Port 1 View  02/02/2012  *RADIOLOGY REPORT*  Clinical Data: Chest pain. History of sarcoidosis.  PORTABLE CHEST - 1 VIEW  Comparison: 10/03/2008  Findings: Single view of the chest was obtained.  Stable appearance of the heart and mediastinum.  There are chronic densities in the right hilum and right upper lung region.  Stable elevation of the medial right hemidiaphragm.  No evidence for pulmonary edema.  IMPRESSION: Stable parenchymal densities in the right lung and right hilar region.  Findings are most likely secondary to sarcoidosis.  No evidence for pulmonary edema.   Original Report Authenticated By: Richarda Overlie, M.D.     EKG: Independently reviewed. Normal sinus rhythm No Acute S-T changes, Q waves in  Inferior Leads seen on previous.    Assessment/Plan Principal Problem:  *Unstable angina Active Problems:  Chest pain  HYPERCHOLESTEROLEMIA  HYPERTENSION  CAD (coronary artery disease)  HYPOTHYROIDISM  CHF  GERD   Plan:   Admit to Stepdown Bed IV NTG drip, ASA, and Plavix and O2 Check Troponins Reconcile Home Medications Notify [facility]   Code Status: FULL CODE Family Communication:  Family at Bedside Disposition Plan: Return to Home  Time spent: 60 minutes  Ron Parker [facility] Pager [phone]  If 7PM-7AM, please contact night-coverage [email or web address] Password [facility] 02/02/2012, 6:34 AM

## 2012-02-02 NOTE — ED Notes (Signed)
Per EMS: pt started having CP at 23:00. Pt took 5 SL ntiro with no relief. EMS gave 325 aspirin, 4mg  of Zofran en route and pt CP went away. Pt was initally 82% on RA and then placed on NRB and stats rose to 100%. Pt was sent home with a cardiac monitor a few days ago and everything came back clear.

## 2012-02-02 NOTE — H&P (Signed)
    Pt was reexamined and existing H & P reviewed. No changes found.  Runell Gess, MD [facility] 02/02/2012 11:49 AM

## 2012-02-02 NOTE — Op Note (Signed)
Dale [name] is a 78 y.o. male    [id number] LOCATION:  FACILITY: [facility]  PHYSICIAN: Nanetta Batty, M.D. 18-Oct-1928   DATE OF PROCEDURE:  02/02/2012  DATE OF DISCHARGE:  [facility]  CARDIAC CATHETERIZATION     History obtained from chart review. This is a 78 y.o. male with a past medical history significant for CAD. He had an RCA stent in 2000 and a DES in June 2009 to a new RCA site. His last cath was in 2011 and these sites were patent. He does have some residual Dx disease. He had chest pain in April 2013 and had a Myoview that was negative for ischemia. He is admitted now with chest pain. His symptoms are somewhat atypical for angina, they seem to be localized. His EKG shows slight inferior ST elevation. His Troponin is negative. He continues to have chest pain despite NTG and Heparin.    PROCEDURE DESCRIPTION:    The patient was brought to the second floor  [facility]iac cath lab in the postabsorptive state. He was not Premedicated. His right groin was prepped and shaved in usual sterile fashion. Xylocaine 1% was used for local anesthesia. A 6 French sheath was inserted into the right common femoral artery using standard Seldinger technique. 6 French right and left Judkins diagnostic catheters along with a 6 French pigtail catheter were used for selective coronary angiography, left ventriculography, and supravalvular aortography. Visipaque dye was used for the entirety of the case. Retrograde aortic, left ventricular pullback pressures were recorded.  HEMODYNAMICS:    AO SYSTOLIC/AO DIASTOLIC: 143/65 LV SYSTOLIC/LV DIASTOLIC: 123/7  ANGIOGRAPHIC RESULTS:   1. Left main; normal 2. LAD; minor irregularities  3. LCX: nondominant with minor irregularities 4. Right coronary artery; dominant with widely patent stents 5 Left ventriculography; RAO left ventriculogram was performed using  25 mL of Visipaque dye at 12 mL/second. The overall LVEF  estimated  70% ithout wall motion abnormalities  6. Supravalvular aortogram: No evidence of dissection.  IMPRESSION:Mr. Weisenberger has essentially noncritical CAD with widely patent stents, normal LV function, no evidence of aortic dissection. I believe his chest pain is noncardiac. An ACT was measured and the sheath was removed. Pressure was held on the groin to achieve hemostasis. The patient left the Cath Lab in stable condition. He'll be transferred back to try to hospitalist service and will be worked up for noncardiac causes of chest pain.  Runell Gess MD, [facility] 02/02/2012 12:20 PM

## 2012-02-02 NOTE — Progress Notes (Signed)
PATIENT DETAILS Name: Dale [name] Age: 78 y.o. Sex: male Date of Birth: 03/21/1929 Admit Date: 02/02/2012 Admitting Physician Ron Parker, MD ZOX:[id number] M, MD  Subjective: Chest pain better-it is reproducible with palpation, with movement of the left arm and also with deep breaths  Assessment/Plan: Principal Problem:  *Chest Pain -better-off Heparin gtt, Tridil gtt -Cardiac cath today-stents patent -pain is reproducible-suspect musculoskeletal etiology -add oxycodone for moderate pain  Active Problems: Known CAD -enzymes and cardiac cath neg -c/w ASA, Plavix, Metoprolol and Statins  HTN -moderate control with Lopressor, Losartan, Imdur, HCTZ   HYPOTHYROIDISM -c/w Levothyroxine   HYPERCHOLESTEROLEMIA -c/e Statins/Zetia   GERD -c/w PPI -prn GI cocktail   ITP (idiopathic thrombocytopenic purpura) 2002 -platelets stable   BPH (benign prostatic hyperplasia) -c/w Finasteride  Disposition: Remain inpatient  DVT Prophylaxis: Prophylactic Lovenox   Code Status: Full code   Procedures:  LHC-9/29  CONSULTS:  cardiology  PHYSICAL EXAM: Vital signs in last 24 hours: Filed Vitals:   02/02/12 1545 02/02/12 1600 02/02/12 1608 02/02/12 1630  BP: 155/65 143/66  139/62  Pulse: 90 80  85  Temp: 100 F (37.8 C)  100.1 F (37.8 C)   TempSrc: Oral  Oral   Resp: 17 11  19   Height:      Weight:      SpO2: 100% 100%  100%    Weight change:  Body mass index is 18.98 kg/(m^2).   Gen Exam: Awake and alert with clear speech.   Neck: Supple, No JVD.  Chest: B/L Clear.   CVS: S1 S2 Regular, no murmurs. Abdomen: soft, BS +, non tender, non distended.  Extremities: no edema, lower extremities warm to touch. Neurologic: Non Focal.   Skin: No Rash.  Wounds: N/A.    Intake/Output from previous day:  Intake/Output Summary (Last 24 hours) at 02/02/12 1723 Last data filed at 02/02/12 1130  Gross per 24 hour  Intake   55.5 ml  Output    150 ml    Net  -94.5 ml     LAB RESULTS: CBC  Lab 02/02/12 0346  WBC 9.6  HGB 12.0*  HCT 35.1*  PLT 173  MCV 97.2  MCH 33.2  MCHC 34.2  RDW 13.6  LYMPHSABS 1.0  MONOABS 1.4*  EOSABS 0.4  BASOSABS 0.0  BANDABS --    Chemistries   Lab 02/02/12 0346  NA 138  K 4.0  CL 101  CO2 28  GLUCOSE 130*  BUN 17  CREATININE 0.81  CALCIUM 9.4  MG --    CBG:  Lab 02/02/12 0310  GLUCAP 93    GFR Estimated Creatinine Clearance: 59 ml/min (by C-G formula based on Cr of 0.81).  Coagulation profile  Lab 02/02/12 1138  INR 1.06  PROTIME --    Cardiac Enzymes  Lab 02/02/12 1138 02/02/12 0655 02/02/12 0350  CKMB -- -- --  TROPONINI <0.30 <0.30 <0.30  MYOGLOBIN -- -- --    No components found with this basename: POCBNP:3  Basename 02/02/12 1400  DDIMER 0.44   No results found for this basename: HGBA1C:2 in the last 72 hours No results found for this basename: CHOL:2,HDL:2,LDLCALC:2,TRIG:2,CHOLHDL:2,LDLDIRECT:2 in the last 72 hours No results found for this basename: TSH,T4TOTAL,FREET3,T3FREE,THYROIDAB in the last 72 hours No results found for this basename: VITAMINB12:2,FOLATE:2,FERRITIN:2,TIBC:2,IRON:2,RETICCTPCT:2 in the last 72 hours No results found for this basename: LIPASE:2,AMYLASE:2 in the last 72 hours  Urine Studies No results found for this basename: UACOL:2,UAPR:2,USPG:2,UPH:2,UTP:2,UGL:2,UKET:2,UBIL:2,UHGB:2,UNIT:2,UROB:2,ULEU:2,UEPI:2,UWBC:2,URBC:2,UBAC:2,CAST:2,CRYS:2,UCOM:2,BILUA:2 in the last 72 hours  MICROBIOLOGY: Recent Results (  from the past 240 hour(s))  MRSA PCR SCREENING     Status: Normal   Collection Time   02/02/12 10:51 AM      Component Value Range Status Comment   MRSA by PCR NEGATIVE  NEGATIVE Final     RADIOLOGY STUDIES/RESULTS: Dg Chest Port 1 View  02/02/2012  *RADIOLOGY REPORT*  Clinical Data: Chest pain. History of sarcoidosis.  PORTABLE CHEST - 1 VIEW  Comparison: 10/03/2008  Findings: Single view of the chest was obtained.   Stable appearance of the heart and mediastinum.  There are chronic densities in the right hilum and right upper lung region.  Stable elevation of the medial right hemidiaphragm.  No evidence for pulmonary edema.  IMPRESSION: Stable parenchymal densities in the right lung and right hilar region.  Findings are most likely secondary to sarcoidosis.  No evidence for pulmonary edema.   Original Report Authenticated By: Richarda Overlie, M.D.     MEDICATIONS: Scheduled Meds:   . sodium chloride   Intravenous Once  . aspirin  81 mg Oral Daily  . atorvastatin  10 mg Oral q1800  . clopidogrel  75 mg Oral Daily  . dextromethorphan-guaiFENesin  1 tablet Oral Daily  . dorzolamide  1 drop Both Eyes BID  . ezetimibe  10 mg Oral Daily  . finasteride  5 mg Oral Daily  . gi cocktail  30 mL Oral Once  . heparin      . heparin  3,500 Units Intravenous Once  . hydrochlorothiazide  12.5 mg Oral Daily  . isosorbide mononitrate  60 mg Oral Daily  . levothyroxine  88 mcg Oral QAC breakfast  . lidocaine      . losartan  100 mg Oral Daily  . metoprolol succinate  25 mg Oral BID  . nitroGLYCERIN      . ondansetron (ZOFRAN) IV  4 mg Intravenous Once  . pantoprazole  40 mg Oral Daily  . promethazine  12.5 mg Intravenous Once  . timolol  1 drop Both Eyes BID  . Travoprost (BAK Free)  1 drop Both Eyes QHS  . vitamin C  500 mg Oral Daily  . DISCONTD: aspirin  162 mg Oral Once  . DISCONTD: aspirin  324 mg Oral Pre-Cath  . DISCONTD: aspirin EC  325 mg Oral Daily  . DISCONTD: aspirin  81 mg Oral Daily  . DISCONTD: diazepam  5 mg Oral On Call  . DISCONTD: losartan  100 mg Oral Daily  . DISCONTD: sodium chloride  3 mL Intravenous Q12H  . DISCONTD: travoprost (benzalkonium)  1 drop Both Eyes QHS   Continuous Infusions:   . sodium chloride    . DISCONTD: sodium chloride    . DISCONTD: heparin 750 Units/hr (02/02/12 0542)  . DISCONTD: nitroGLYCERIN 25 mcg/min (02/02/12 1130)   PRN Meds:.acetaminophen, acetaminophen,  acetaminophen, ALPRAZolam, HYDROmorphone (DILAUDID) injection, nitroGLYCERIN, nitroGLYCERIN, ondansetron (ZOFRAN) IV, ondansetron, traMADol, zolpidem, DISCONTD: sodium chloride, DISCONTD: acetaminophen, DISCONTD: ondansetron (ZOFRAN) IV, DISCONTD: ondansetron (ZOFRAN) IV, DISCONTD: sodium chloride  Antibiotics: Anti-infectives    None       Jeoffrey Massed, MD  [facility] Pager:[phone]  If 7PM-7AM, please contact night-coverage [email or web address] Password [email or web address] 02/02/2012, 5:23 PM   LOS: 0 days

## 2012-02-02 NOTE — ED Notes (Signed)
X-ray at bedside

## 2012-02-02 NOTE — ED Provider Notes (Addendum)
History     CSN: [id number]  Arrival date & time 02/02/12  0305   First MD Initiated Contact with Patient 02/02/12 [phone]      Chief Complaint  Patient presents with  . Chest Pain    (Consider location/radiation/quality/duration/timing/severity/associated sxs/prior treatment) HPI Comments: The patient is an 78 yo male with a history of CAD with stent to the RCA in 2000 and cypher stent in 2009 beyond the acute margin of his RCA, comes in with cc of chest pain. Pt has hx of stable angina. States that the pain today, is at the same location, and similar in nature - left sided, substernal, non radiating - but it is more severe and not responding to the nitro like it typically does. Pt has no associated n/v/f/c, sob, diaphoresis, palpitations.  Per med record review:  His last cath was May 2011 showed mild irregularity in the proximal LAD with an old 70% ostial narrowing in the small first diagonal branch, 30% mid LAD, 20% AV groove stenosis and the RCA stents were patent.  He also has a history of sarcoidosis, macular degeneration, ITP, hypothyroidism, HTN, and hyperlipidemia.  Also, had a MPI earlier this year EF 70% and no reversible defects.  Patient is a 78 y.o. male presenting with chest pain. The history is provided by the patient.  Chest Pain Pertinent negatives for primary symptoms include no shortness of breath, no cough, no palpitations and no abdominal pain.     Past Medical History  Diagnosis Date  . Coronary heart disease   . Idiopathic thrombocytopenic purpura   . Allergic rhinitis   . Sarcoidosis   . Hyperlipidemia   . Hypertension   . Macular degeneration   . Hypothyroidism   . Chest pain   . GERD (gastroesophageal reflux disease)   . CHF (congestive heart failure)   . Cancer   . Asthma     Past Surgical History  Procedure Date  . Mediastinoscopy     for dx sarcoid  . Coronary angioplasty with stent placement   . Cardiac stents     Family History    Problem Relation Age of Onset  . Heart disease Father   . Stroke Mother     History  Substance Use Topics  . Smoking status: Never Smoker   . Smokeless tobacco: Never Used  . Alcohol Use: No      Review of Systems  Constitutional: Negative for activity change and appetite change.  Respiratory: Negative for cough and shortness of breath.   Cardiovascular: Positive for chest pain. Negative for palpitations.  Gastrointestinal: Negative for abdominal pain.  Genitourinary: Negative for dysuria.    Allergies  Hydrocodone; Morphine; Penicillins; and Sulfonamide derivatives  Home Medications   Current Outpatient Rx  Name Route Sig Dispense Refill  . ASPIRIN 81 MG PO TABS Oral Take 81 mg by mouth daily.      Marland Kitchen CLOPIDOGREL BISULFATE 75 MG PO TABS Oral Take 75 mg by mouth daily.     Marland Kitchen DM-GUAIFENESIN ER 30-600 MG PO TB12 Oral Take 1 tablet by mouth daily.     . DORZOLAMIDE HCL 2 % OP SOLN Both Eyes Place 1 drop into both eyes 2 (two) times daily.     Marland Kitchen EZETIMIBE 10 MG PO TABS Oral Take 10 mg by mouth daily.      Marland Kitchen FINASTERIDE 5 MG PO TABS Oral Take 5 mg by mouth daily.      Marland Kitchen HYDROCHLOROTHIAZIDE 12.5 MG PO CAPS Oral Take  12.5 mg by mouth daily.    . ISOSORBIDE MONONITRATE ER 60 MG PO TB24 Oral Take 60 mg by mouth daily.      Marland Kitchen LEVOTHYROXINE SODIUM 88 MCG PO TABS Oral Take 88 mcg by mouth daily.      Marland Kitchen LOSARTAN POTASSIUM 100 MG PO TABS Oral Take 100 mg by mouth daily.      Marland Kitchen METOPROLOL SUCCINATE ER 25 MG PO TB24 Oral Take 25 mg by mouth 2 (two) times daily.     Marland Kitchen ONE-DAILY MULTI VITAMINS PO TABS Oral Take 1 tablet by mouth daily.      Marland Kitchen PRESERVISION/LUTEIN PO CAPS Oral Take 2 capsules by mouth daily.     Marland Kitchen NITROGLYCERIN 0.4 MG SL SUBL Sublingual Place 0.4 mg under the tongue every 5 (five) minutes as needed. For chest pain    . PANTOPRAZOLE SODIUM 40 MG PO TBEC Oral Take 1 tablet (40 mg total) by mouth daily. 90 tablet 3  . PRESCRIPTION MEDICATION Injection Inject 1 each as directed  once a week. Allergy shot= thursday    . ROSUVASTATIN CALCIUM 5 MG PO TABS Oral Take 5 mg by mouth daily.      Marland Kitchen TIMOLOL MALEATE 0.5 % OP SOLN Both Eyes Place 1 drop into both eyes 2 (two) times daily.     . TRAVOPROST 0.004 % OP SOLN Both Eyes Place 1 drop into both eyes at bedtime.    . TRIAMCINOLONE ACETONIDE 0.1 % EX CREA Topical Apply topically 2 (two) times daily as needed.      Marland Kitchen VITAMIN C 500 MG PO TABS Oral Take 500 mg by mouth daily.      Marland Kitchen VITAMIN E 200 UNITS PO CAPS Oral Take 200 Units by mouth daily.        BP 149/73  Pulse 70  Temp 98.5 F (36.9 C) (Oral)  Resp 14  SpO2 100%  Physical Exam  Nursing note and vitals reviewed. Constitutional: He is oriented to person, place, and time. He appears well-developed.  HENT:  Head: Normocephalic and atraumatic.  Eyes: Conjunctivae normal and EOM are normal. Pupils are equal, round, and reactive to light.  Neck: Normal range of motion. Neck supple. No JVD present.  Cardiovascular: Normal rate and regular rhythm.   Pulmonary/Chest: Effort normal and breath sounds normal.  Abdominal: Soft. Bowel sounds are normal. He exhibits no distension. There is no tenderness. There is no rebound and no guarding.  Neurological: He is alert and oriented to person, place, and time.  Skin: Skin is warm.    ED Course  Procedures (including critical care time)   Labs Reviewed  GLUCOSE, CAPILLARY  CBC WITH DIFFERENTIAL  BASIC METABOLIC PANEL  TROPONIN I  URINALYSIS, ROUTINE W REFLEX MICROSCOPIC   No results found.   No diagnosis found.    MDM   Date: 02/02/2012  Rate: 70  Rhythm: normal sinus rhythm  QRS Axis: normal  Intervals: PR prolonged  ST/T Wave abnormalities: nonspecific ST changes  Conduction Disutrbances:first-degree A-V block   Narrative Interpretation:   Old EKG Reviewed: unchanged ST changes seen before as well.   Differential diagnosis includes: ACS syndrome CHF exacerbation Valvular  disorder Myocarditis Pericarditis Pericardial effusion Pneumonia Pleural effusion Pulmonary edema PE Anemia Musculoskeletal pain  Pt comes in with cc of chest pain, left sided and mid/substernal, non radiating - typical of his stable angina - just more severe today and not responding to nitro. Concerns clinically for ACS/Unstable angina. Just has some ST changes, but  they are unchanged, we will repeat an EKG.   Rate: 70  Rhythm: normal sinus rhythm  QRS Axis: normal  Intervals: PR prolonged  ST/T Wave abnormalities: nonspecific ST changes  Conduction Disutrbances:first-degree A-V block   Narrative Interpretation:   Old EKG Reviewed: unchanged ST changes seen before as well.  Persistent pain - improved to 5/10 with nitro. Will admit as unstable angina.  Derwood Kaplan, MD 02/02/12 4098  Derwood Kaplan, MD 02/02/12 1191

## 2012-02-03 DIAGNOSIS — K219 Gastro-esophageal reflux disease without esophagitis: Secondary | ICD-10-CM | POA: Diagnosis not present

## 2012-02-03 DIAGNOSIS — N400 Enlarged prostate without lower urinary tract symptoms: Secondary | ICD-10-CM | POA: Diagnosis not present

## 2012-02-03 DIAGNOSIS — I2510 Atherosclerotic heart disease of native coronary artery without angina pectoris: Secondary | ICD-10-CM | POA: Diagnosis not present

## 2012-02-03 DIAGNOSIS — I200 Unstable angina: Secondary | ICD-10-CM | POA: Diagnosis not present

## 2012-02-03 LAB — CBC
HCT: 33.4 % — ABNORMAL LOW (ref 39.0–52.0)
MCV: 97.4 fL (ref 78.0–100.0)
Platelets: 158 10*3/uL (ref 150–400)
RBC: 3.43 MIL/uL — ABNORMAL LOW (ref 4.22–5.81)
RDW: 13.9 % (ref 11.5–15.5)
WBC: 10.5 10*3/uL (ref 4.0–10.5)

## 2012-02-03 LAB — BASIC METABOLIC PANEL
CO2: 27 mEq/L (ref 19–32)
Chloride: 101 mEq/L (ref 96–112)
Creatinine, Ser: 0.83 mg/dL (ref 0.50–1.35)
GFR calc Af Amer: >90 mL/min (ref >90)
Potassium: 3.5 mEq/L (ref 3.5–5.1)

## 2012-02-03 LAB — POCT ACTIVATED CLOTTING TIME: Activated Clotting Time: 149 seconds

## 2012-02-03 MED ORDER — METOPROLOL SUCCINATE ER 25 MG PO TB24
25.0000 mg | ORAL_TABLET | Freq: Two times a day (BID) | ORAL | Status: DC
  Administered 2012-02-03: 25 mg via ORAL
  Filled 2012-02-03 (×2): qty 1

## 2012-02-03 MED ORDER — ISOSORBIDE MONONITRATE ER 60 MG PO TB24
60.0000 mg | ORAL_TABLET | Freq: Every day | ORAL | Status: DC
  Administered 2012-02-03: 60 mg via ORAL
  Filled 2012-02-03: qty 1

## 2012-02-03 NOTE — Progress Notes (Signed)
SBP 80's , metoprolol held. On call PA notified. New order received, will cont to monitor closely.

## 2012-02-03 NOTE — Discharge Summary (Signed)
Dale [name] Age: 78 y.o. Sex: male Date of Birth: 03/19/1929 MRN: [id number]. Admit Date: 02/02/2012 Admitting Physician: Ron Parker, MD WUJ:[id number],WGNFAOZ M, MD  Recommendations for Outpatient Follow-up:  Work up for non-cardiac causes of chest pain  PRIMARY DISCHARGE DIAGNOSIS:  Principal Problem:  *Unstable angina, cath showed patent stents 02/02/12 Active Problems:  Sarcoidosis  HYPOTHYROIDISM  HYPERCHOLESTEROLEMIA  HYPERTENSION  GERD  CAD, RCA stent, RCA new DES July 2009, Cath OK 2011, 02/02/12  ITP (idiopathic thrombocytopenic purpura) 2002  BPH (benign prostatic hyperplasia)      PAST MEDICAL HISTORY: Past Medical History  Diagnosis Date  . Coronary heart disease   . Idiopathic thrombocytopenic purpura   . Allergic rhinitis   . Sarcoidosis   . Hyperlipidemia   . Hypertension   . Macular degeneration   . Hypothyroidism   . Chest pain   . GERD (gastroesophageal reflux disease)   . CHF (congestive heart failure)   . Cancer   . Asthma     DISCHARGE MEDICATIONS:   Medication List     As of 02/03/2012 12:46 PM    TAKE these medications         aspirin 81 MG tablet   Take 81 mg by mouth daily.      clopidogrel 75 MG tablet   Commonly known as: PLAVIX   Take 75 mg by mouth daily.      dextromethorphan-guaiFENesin 30-600 MG per 12 hr tablet   Commonly known as: MUCINEX DM   Take 1 tablet by mouth daily.      dorzolamide 2 % ophthalmic solution   Commonly known as: TRUSOPT   Place 1 drop into both eyes 2 (two) times daily.      ezetimibe 10 MG tablet   Commonly known as: ZETIA   Take 10 mg by mouth daily.      finasteride 5 MG tablet   Commonly known as: PROSCAR   Take 5 mg by mouth daily.      hydrochlorothiazide 12.5 MG capsule   Commonly known as: MICROZIDE   Take 12.5 mg by mouth daily.      isosorbide mononitrate 60 MG 24 hr tablet   Commonly known as: IMDUR   Take 60 mg by mouth daily.     levothyroxine 88 MCG tablet   Commonly known as: SYNTHROID, LEVOTHROID   Take 88 mcg by mouth daily.      losartan 100 MG tablet   Commonly known as: COZAAR   Take 100 mg by mouth daily.      metoprolol succinate 25 MG 24 hr tablet   Commonly known as: TOPROL-XL   Take 25 mg by mouth 2 (two) times daily.      multivitamin tablet   Take 1 tablet by mouth daily.      nitroGLYCERIN 0.4 MG SL tablet   Commonly known as: NITROSTAT   Place 0.4 mg under the tongue every 5 (five) minutes as needed. For chest pain      pantoprazole 40 MG tablet   Commonly known as: PROTONIX   Take 1 tablet (40 mg total) by mouth daily.      PRESCRIPTION MEDICATION   Inject 1 each as directed once a week. Allergy shot= thursday      PreserVision/Lutein Caps   Take 2 capsules by mouth daily.      rosuvastatin 5 MG tablet   Commonly known as: CRESTOR   Take 5 mg by mouth daily.  timolol 0.5 % ophthalmic solution   Commonly known as: TIMOPTIC   Place 1 drop into both eyes 2 (two) times daily.      travoprost (benzalkonium) 0.004 % ophthalmic solution   Commonly known as: TRAVATAN   Place 1 drop into both eyes at bedtime.      triamcinolone cream 0.1 %   Commonly known as: KENALOG   Apply topically 2 (two) times daily as needed.      vitamin C 500 MG tablet   Commonly known as: ASCORBIC ACID   Take 500 mg by mouth daily.      vitamin E 200 UNIT capsule   Take 200 Units by mouth daily.         BRIEF HPI:  See H&P, Labs, Consult and Test reports for all details in brief, Dale [name] is a 78 y.o. male with a history of CAD who presents to the ED with complaints of severe 8/10 chest pain that started at 11 pm. The pain has been unremitting, and started in the left chest and moved into the center of his chest. He reports having SOB and diaphoresis associated with the pain. He was evaluated in the ED and found to have a negative Workup and no new EKG changes. He was referred for medical  admission.  CONSULTATIONS:   cardiology  PERTINENT RADIOLOGIC STUDIES: Dg Chest Port 1 View  02/02/2012  *RADIOLOGY REPORT*  Clinical Data: Chest pain. History of sarcoidosis.  PORTABLE CHEST - 1 VIEW  Comparison: 10/03/2008  Findings: Single view of the chest was obtained.  Stable appearance of the heart and mediastinum.  There are chronic densities in the right hilum and right upper lung region.  Stable elevation of the medial right hemidiaphragm.  No evidence for pulmonary edema.  IMPRESSION: Stable parenchymal densities in the right lung and right hilar region.  Findings are most likely secondary to sarcoidosis.  No evidence for pulmonary edema.   Original Report Authenticated By: Richarda Overlie, M.D.      PERTINENT LAB RESULTS: CBC:  Basename 02/03/12 0430 02/02/12 0346  WBC 10.5 9.6  HGB 11.0* 12.0*  HCT 33.4* 35.1*  PLT 158 173   CMET CMP     Component Value Date/Time   NA 134* 02/03/2012 0430   K 3.5 02/03/2012 0430   CL 101 02/03/2012 0430   CO2 27 02/03/2012 0430   GLUCOSE 125* 02/03/2012 0430   BUN 16 02/03/2012 0430   CREATININE 0.83 02/03/2012 0430   CALCIUM 8.6 02/03/2012 0430   PROT 6.3 09/05/2009 1730   ALBUMIN 3.6 09/05/2009 1730   AST 29 09/05/2009 1730   ALT 29 09/05/2009 1730   ALKPHOS 71 09/05/2009 1730   BILITOT 0.9 09/05/2009 1730   GFRNONAA 80* 02/03/2012 0430   GFRAA >90 02/03/2012 0430    GFR Estimated Creatinine Clearance: 57.6 ml/min (by C-G formula based on Cr of 0.83). No results found for this basename: LIPASE:2,AMYLASE:2 in the last 72 hours  Basename 02/02/12 1138 02/02/12 0655 02/02/12 0350  CKTOTAL -- -- --  CKMB -- -- --  CKMBINDEX -- -- --  TROPONINI <0.30 <0.30 <0.30   No components found with this basename: POCBNP:3  Basename 02/02/12 1400  DDIMER 0.44   No results found for this basename: HGBA1C:2 in the last 72 hours No results found for this basename: CHOL:2,HDL:2,LDLCALC:2,TRIG:2,CHOLHDL:2,LDLDIRECT:2 in the last 72 hours No results found for  this basename: TSH,T4TOTAL,FREET3,T3FREE,THYROIDAB in the last 72 hours No results found for this basename: VITAMINB12:2,FOLATE:2,FERRITIN:2,TIBC:2,IRON:2,RETICCTPCT:2 in  the last 72 hours Coags:  Basename 02/02/12 1138  INR 1.06   Microbiology: Recent Results (from the past 240 hour(s))  MRSA PCR SCREENING     Status: Normal   Collection Time   02/02/12 10:51 AM      Component Value Range Status Comment   MRSA by PCR NEGATIVE  NEGATIVE Final      BRIEF HOSPITAL COURSE:   Principal Problem:  Chest Pain -given history initially thought to have Unstable angina,and admitted to SDU with IV heparin, IV NTG infusion. -cards consulted, cath done which showed patent stents 02/02/12 -pain deemed to be non-cardiac -D-Dimer also within normal limits -yesterday-pain was reproducible to palpation, and with movement -today is completely pain free -further work up if any for non-cardiac chest pain can be done as outpatient by PCP or primary cardiologist  Known CAD  -enzymes and cardiac cath neg  -c/w ASA, Plavix, Metoprolol and Statins   HTN  -moderate control with Lopressor, Losartan, Imdur, HCTZ   HYPOTHYROIDISM  -c/w Levothyroxine   HYPERCHOLESTEROLEMIA  -c/w Statins/Zetia   GERD  -c/w PPI   ITP (idiopathic thrombocytopenic purpura) 2002  -platelets stable   BPH (benign prostatic hyperplasia)  -c/w Finasteride  TODAY-DAY OF DISCHARGE:  Subjective:   Iva Boop today has no headache,no chest abdominal pain,no new weakness tingling or numbness, feels much better wants to go home today.   Objective:   Blood pressure 116/56, pulse 64, temperature 97.8 F (36.6 C), temperature source Oral, resp. rate 16, height 5\' 9"  (1.753 m), weight 58.3 kg (128 lb 8.5 oz), SpO2 97.00%.  Intake/Output Summary (Last 24 hours) at 02/03/12 1246 Last data filed at 02/03/12 1052  Gross per 24 hour  Intake   1788 ml  Output   1175 ml  Net    613 ml    Exam Awake Alert, Oriented *3, No  new F.N deficits, Normal affect [place].AT,PERRAL Supple Neck,No JVD, No cervical lymphadenopathy appriciated.  Symmetrical Chest wall movement, Good air movement bilaterally, CTAB RRR,No Gallops,Rubs or new Murmurs, No Parasternal Heave +ve B.Sounds, Abd Soft, Non tender, No organomegaly appriciated, No rebound -guarding or rigidity. No Cyanosis, Clubbing or edema, No new Rash or bruise  DISCHARGE CONDITION: Stable  DISPOSITION: HOME  DISCHARGE INSTRUCTIONS:    Activity:  As tolerated with Full fall precautions use walker/cane & assistance as needed  Diet recommendation: Heart Healthy diet       Follow-up Information    Follow up with Lennette Bihari, MD. On 03/10/2012. (10:45 am)    Contact information:   [address] [place] [address] [phone]       Follow up with Kirk Ruths, MD. Schedule an appointment as soon as possible for a visit in 1 week.   Contact information:   [address] [place] [address] [phone]         Total Time spent on discharge equals 45 minutes.  SignedJeoffrey Massed 02/03/2012 12:46 PM

## 2012-02-03 NOTE — Plan of Care (Signed)
Problem: Phase III Progression Outcomes Goal: Vascular site scale level 0 - I Vascular Site Scale Level 0: No bruising/bleeding/hematoma Level I (Mild): Bruising/Ecchymosis, minimal bleeding/ooozing, palpable hematoma < 3 cm Level II (Moderate): Bleeding not affecting hemodynamic parameters, pseudoaneurysm, palpable hematoma > 3 cm  Outcome: Completed/Met Date Met:  02/03/12 Rt groin site level "o" after ambulation in hall . Goal: Tolerating diet Outcome: Completed/Met Date Met:  02/03/12 Ate 75 % of breakfast and lunch .Good po fluid intake .Voiding w/o diff. Goal: Other Phase III Outcomes/Goals Outcome: Completed/Met Date Met:  02/03/12 Denies pain w/exertion/deep resps

## 2012-02-03 NOTE — Evaluation (Signed)
Physical Therapy Evaluation Patient Details Name: Dale [name] MRN: [id number] DOB: 1929/01/06 Today's Date: 02/03/2012 Time: 5621-3086 PT Time Calculation (min): 24 min  PT Assessment / Plan / Recommendation Clinical Impression  Patient s/p HTN with decr mobility secondary to decr balance.  Educated patient and family that patient would benefit from use of cane on d/c home until he is 100% recuperated.  Wife agrees.  Wife and patient decline HHPT as they feel he will progress well once home.  Will follow patient while in hospital.      PT Assessment  Patient needs continued PT services    Follow Up Recommendations  No PT follow up    Barriers to Discharge        Equipment Recommendations  None recommended by PT    Recommendations for Other Services     Frequency Min 3X/week    Precautions / Restrictions Precautions Precautions: Fall Restrictions Weight Bearing Restrictions: No   Pertinent Vitals/Pain VSS, No pain      Mobility  Bed Mobility Bed Mobility: Rolling Right;Right Sidelying to Sit Rolling Right: 7: Independent Right Sidelying to Sit: 7: Independent Transfers Transfers: Sit to Stand;Stand to Sit Sit to Stand: 7: Independent;With upper extremity assist Stand to Sit: 7: Independent;With upper extremity assist;With armrests;To chair/3-in-1 Ambulation/Gait Ambulation/Gait Assistance: 4: Min assist Ambulation Distance (Feet): 250 Feet Assistive device: None Ambulation/Gait Assistance Details: Patient ambulated without device.  Occasional staggering gait needing physical assist to steady patient.  Recommended that patient use cane for stability on d/c.   Gait Pattern: Step-through pattern;Decreased stride length;Scissoring;Narrow base of support Gait velocity: decreased Stairs: No Wheelchair Mobility Wheelchair Mobility: No              PT Diagnosis: Generalized weakness  PT Problem List: Decreased activity tolerance;Decreased balance;Decreased  mobility;Decreased safety awareness;Decreased knowledge of use of DME;Decreased knowledge of precautions PT Treatment Interventions: DME instruction;Gait training;Stair training;Functional mobility training;Therapeutic activities;Therapeutic exercise;Balance training;Patient/family education   PT Goals Acute Rehab PT Goals PT Goal Formulation: With patient Time For Goal Achievement: 02/10/12 Potential to Achieve Goals: Good Pt will Ambulate: >150 feet;with modified independence;with least restrictive assistive device PT Goal: Ambulate - Progress: Goal set today Pt will Go Up / Down Stairs: 1-2 stairs;with modified independence;with least restrictive assistive device PT Goal: Up/Down Stairs - Progress: Goal set today Additional Goals Additional Goal #1: Patient to perform Berg test with score > 50/56.   PT Goal: Additional Goal #1 - Progress: Goal set today  Visit Information  Last PT Received On: 02/03/12 Assistance Needed: +1    Subjective Data  Subjective: "I do good without a device." Patient Stated Goal: To go home.   Prior Functioning  Home Living Lives With: Spouse Available Help at Discharge: Family;Available 24 hours/day Type of Home: House Home Access: Stairs to enter [employer] of Steps: 3 Entrance Stairs-Rails: Can reach both Home Layout: One level Bathroom Shower/Tub: Engineer, manufacturing systems: Handicapped height Home Adaptive Equipment: Hand-held shower hose;Walker - rolling;Straight cane;Grab bars in shower Prior Function Level of Independence: Independent Able to Take Stairs?: Yes Driving: Yes Vocation: Retired Musician: No difficulties Dominant Hand: Right    Cognition  Overall Cognitive Status: Appears within functional limits for tasks assessed/performed Arousal/Alertness: Awake/alert Orientation Level: Appears intact for tasks assessed Behavior During Session: [facility] for tasks performed    Extremity/Trunk  Assessment Right Upper Extremity Assessment RUE ROM/Strength/Tone: [facility] for tasks assessed Left Upper Extremity Assessment LUE ROM/Strength/Tone: [facility] for tasks assessed Right Lower Extremity Assessment RLE ROM/Strength/Tone:  WFL for tasks assessed Left Lower Extremity Assessment LLE ROM/Strength/Tone: [facility] for tasks assessed Trunk Assessment Trunk Assessment: Normal   Balance Dynamic Standing Balance Dynamic Standing - Balance Support: No upper extremity supported;During functional activity Dynamic Standing - Level of Assistance: 4: Min assist Dynamic Standing - Balance Activities: Lateral lean/weight shifting;Forward lean/weight shifting;Reaching across midline Dynamic Standing - Comments: needed steadying assist with challenges. High Level Balance High Level Balance Activites: Turns;Direction changes High Level Balance Comments: Needed steadying assist with challenges.  End of Session PT - End of Session Equipment Utilized During Treatment: Gait belt Activity Tolerance: Patient tolerated treatment well Patient left: in chair;with call bell/phone within reach;with family/visitor present Nurse Communication: Mobility status       INGOLD,[name] 02/03/2012, 1:59 PM  [facility] Acute Rehabilitation [phone] (pager)

## 2012-02-03 NOTE — Progress Notes (Signed)
Assisted Pt out of bed to bsc, white sitting on bsc, rt groin observed oozing. Pt assisted back to bed, manual pressure held to site for 10 mins. Site soft, no hematoma. Pressure drsg applied to site. Will cont to monitor.

## 2012-02-03 NOTE — Care Management Note (Signed)
    Page 1 of 1   02/03/2012     10:33:36 AM   CARE MANAGEMENT NOTE 02/03/2012  Patient:  Dale [name], Dale [name]   Account Number:  [id number]  Date Initiated:  02/03/2012  Documentation initiated by:  Junius Creamer  Subjective/Objective Assessment:   adm w angina     Action/Plan:   lives w wife, pcp dr Karleen Hampshire   Anticipated DC Date:     Anticipated DC Plan:        DC Planning Services  CM consult      Choice offered to / List presented to:             Status of service:   Medicare Important Message given?   (If response is "NO", the following Medicare IM given date fields will be blank) Date Medicare IM given:   Date Additional Medicare IM given:    Discharge Disposition:    Per UR Regulation:  Reviewed for med. necessity/level of care/duration of stay  If discussed at Long Length of Stay Meetings, dates discussed:    Comments:  9/30 10:32a debbie Cristyn Crossno rn,bsn [phone]

## 2012-02-03 NOTE — Progress Notes (Signed)
Chaplain responded to Code Stemi page at [phone]. Patient was being prepped by nurses for Cath Lab procedure. Chaplain prayed for both patient and family before the procedure. Chaplain made a follow up visit after procedure but patient was resting. Chaplain will pass on to Spiritual care Team for a later follow up. Chaplain will continue to visit and provide spiritual support and care to patient and family at a later time.

## 2012-02-03 NOTE — Progress Notes (Signed)
Subjective:  Chest pain is better.  Objective:  Vital Signs in the last 24 hours: Temp:  [97.5 F (36.4 C)-100.4 F (38 C)] 97.9 F (36.6 C) (09/30 0718) Pulse Rate:  [57-105] 74  (09/30 0718) Resp:  [8-22] 12  (09/30 0600) BP: (82-155)/(38-81) 122/52 mmHg (09/30 0718) SpO2:  [95 %-100 %] 100 % (09/30 0718) Weight:  [58.3 kg (128 lb 8.5 oz)] 58.3 kg (128 lb 8.5 oz) (09/29 1000)  Intake/Output from previous day:  Intake/Output Summary (Last 24 hours) at 02/03/12 0900 Last data filed at 02/03/12 0600  Gross per 24 hour  Intake 1343.5 ml  Output    675 ml  Net  668.5 ml    Physical Exam: General appearance: alert, cooperative and no distress Lungs: clear to auscultation bilaterally Heart: regular rate and rhythm Rt groin without hematoma   Rate: 72  Rhythm: normal sinus rhythm  Lab Results:  Basename 02/03/12 0430 02/02/12 0346  WBC 10.5 9.6  HGB 11.0* 12.0*  PLT 158 173    Basename 02/03/12 0430 02/02/12 0346  NA 134* 138  K 3.5 4.0  CL 101 101  CO2 27 28  GLUCOSE 125* 130*  BUN 16 17  CREATININE 0.83 0.81    Basename 02/02/12 1138 02/02/12 0655  TROPONINI <0.30 <0.30   Hepatic Function Panel No results found for this basename: PROT,ALBUMIN,AST,ALT,ALKPHOS,BILITOT,BILIDIR,IBILI in the last 72 hours No results found for this basename: CHOL in the last 72 hours  Basename 02/02/12 1138  INR 1.06    Imaging: Imaging results have been reviewed  Cardiac Studies:  Assessment/Plan:   Principal Problem:  *Unstable angina, cath showed patent stents 02/02/12 Active Problems:  Sarcoidosis  CAD, RCA stent, RCA new DES July 2009, Cath OK 2011, 02/02/12  HYPOTHYROIDISM  HYPERCHOLESTEROLEMIA  HYPERTENSION  GERD  ITP (idiopathic thrombocytopenic purpura) 2002  BPH (benign prostatic hyperplasia)   Plan- Pt admitted with complaints of chest pain and had slight inferior ST elevation. He was treated as [place]. Cath suggest patent coronaries and stents. D dimer  was WNL. B/P was soft post cath, OK now. Cozaar and Imdur can be cut back if needed.  We will sign off. We will arrange follow up with Dr Tresa Endo as an OP in [place].   Corine Shelter PA-C 02/03/2012, 9:00 AM   Patient seen and examined. Agree with assessment and plan. Cath results reviewed. Pleuritic like chest pain; improved today. Residual inferior J point elevation; ? Pericardial. Apparently pt had echo last week by his report. Prob OK to dc today and f/u with me as outpatient.   Lennette Bihari, MD, [facility] 02/03/2012 9:28 AM

## 2012-02-04 NOTE — Progress Notes (Signed)
Late entry 02/04/12 for 02-04-12 missed Gcodes.   02/04/2012 1400  PT G-Codes **NOT FOR INPATIENT CLASS**  Functional Assessment Tool Used Clinical judgment  Functional Limitation Mobility: Walking and moving around  Mobility: Walking and Moving Around Current Status [phone]) CI  Mobility: Walking and Moving Around Goal Status [phone]) [facility]   Thierry Dobosz [place],PT Acute Rehabilitation [phone] (pager)

## 2012-02-06 DIAGNOSIS — Z713 Dietary counseling and surveillance: Secondary | ICD-10-CM | POA: Diagnosis not present

## 2012-02-06 DIAGNOSIS — J069 Acute upper respiratory infection, unspecified: Secondary | ICD-10-CM | POA: Diagnosis not present

## 2012-02-06 DIAGNOSIS — K219 Gastro-esophageal reflux disease without esophagitis: Secondary | ICD-10-CM | POA: Diagnosis not present

## 2012-02-10 ENCOUNTER — Ambulatory Visit (INDEPENDENT_AMBULATORY_CARE_PROVIDER_SITE_OTHER): Payer: Medicare Other | Admitting: Internal Medicine

## 2012-02-10 ENCOUNTER — Encounter: Payer: Self-pay | Admitting: Internal Medicine

## 2012-02-10 ENCOUNTER — Ambulatory Visit (INDEPENDENT_AMBULATORY_CARE_PROVIDER_SITE_OTHER)
Admission: RE | Admit: 2012-02-10 | Discharge: 2012-02-10 | Disposition: A | Discharge status: Home / Self Care | Payer: Medicare Other | Source: Ambulatory Visit | Attending: Internal Medicine | Admitting: Internal Medicine

## 2012-02-10 VITALS — BP 158/72 | HR 73 | Ht 70.0 in | Wt 137.4 lb

## 2012-02-10 DIAGNOSIS — D869 Sarcoidosis, unspecified: Secondary | ICD-10-CM | POA: Diagnosis not present

## 2012-02-10 DIAGNOSIS — J45909 Unspecified asthma, uncomplicated: Secondary | ICD-10-CM | POA: Diagnosis not present

## 2012-02-10 DIAGNOSIS — R079 Chest pain, unspecified: Secondary | ICD-10-CM | POA: Diagnosis not present

## 2012-02-10 MED ORDER — DOXYCYCLINE HYCLATE 100 MG PO TABS: ORAL_TABLET | ORAL | Status: DC

## 2012-02-10 NOTE — Patient Instructions (Addendum)
Script sent for doxycycline antibiotic  Order CXR  Dx sarcoid, acute bronchitis  Don't forget to get your flu shot  We can continue allergy vaccine

## 2012-02-10 NOTE — Progress Notes (Signed)
Subjective:    Patient ID: Dale [name], male    DOB: 05-06-1929, 78 y.o.   MRN: [id number]  HPI 12/27/10-78 year old male never smoker followed for Allergic rhinitis, asthma,  hx sarcoid, glaucoma, history ITP Last here Sep 21, 2009-note reviewed. Allergy vaccine - continues long term. We discussed option to stop or retest.  CO throat tickle at night. Eating or drink, or melting ice in mouth will break it loose. Takes daily mucinex. Feels travatan eye drop get into nose and cause some swelling  02/10/12- 78 year old male never smoker followed for Allergic rhinitis, asthma,  hx sarcoid, complicated by glaucoma, history ITP Still on allergy vaccine 1:50 G0 and doing well with it; C/O yellow thick phelgm in throat Wants to wait on flu vaccine. In May had right hydropneumothorax on CT scan. Last week was in [place] for 2 days with left anterior chest pain/heart cath-"not heart". Some throat clearing of yellow phlegm but otherwise feels well. Had been having reflux. Dr Regino Schultze treated Z-Pak, ending today. CT chest 10/01/11-reviewed with him IMPRESSION:  1. No evidence of pulmonary embolism. Mildly motion degraded  exam.  2. Development of a right-sided hydropneumothorax, small volume.  Critical test results telephoned to [facility], rn. at the time of  interpretation at 12:38 p.m. on 10/01/2011.  3. Findings of sarcoidosis, similar to on the prior exam. No  evidence of superimposed pneumonia or airspace disease.  4. Pulmonary artery enlargement suggests pulmonary arterial  hypertension.  Original Report Authenticated By: Consuello Bossier, M.D.  CXR 02/02/12-reviewed with him IMPRESSION:  Stable parenchymal densities in the right lung and right hilar  region. Findings are most likely secondary to sarcoidosis.  No evidence for pulmonary edema.  Original Report Authenticated By: Richarda Overlie, M.D.   Review of Systems- see HPI Constitutional:   No-   weight loss, night sweats, fevers, chills,  fatigue, lassitude. HEENT:   No-  headaches, difficulty swallowing, tooth/dental problems, sore throat,       No-  sneezing, itching, ear ache, + nasal congestion, post nasal drip,  CV:  No-   chest pain, orthopnea, PND, swelling in lower extremities, anasarca, dizziness, palpitations Resp: No-   shortness of breath with exertion or at rest.              +  productive cough,  No non-productive cough,  No-  coughing up of blood.              No-   change in color of mucus.  No- wheezing.   Skin: No-   rash or lesions. GI:  No-   heartburn, indigestion, abdominal pain, nausea, vomiting,  GU:  MS:  No-   joint pain or swelling.   Neuro- nothing unusual  Psych:  No- change in mood or affect. No depression or anxiety.  No memory loss.    Objective:   Physical Exam General- Alert, Oriented, Affect-appropriate, Distress- none acute   comfortable appearing elderly man Skin- rash-none, lesions- none, excoriation- none Lymphadenopathy- none Head- atraumatic            Eyes- Gross vision intact, PERRLA, conjunctivae clear secretions            Ears- Hearing,  mildly reduced, some scarring right TM            Nose- Clear, no- Septal dev, mucus, polyps, erosion, perforation             Throat- Mallampati II , mucosa clear , drainage- none, tonsils- atrophic  Neck- flexible , trachea midline, no stridor , thyroid nl, carotid no bruit Chest - symmetrical excursion , unlabored           Heart/CV- RRR , no murmur , no gallop  , no rub, nl s1 s2                           - JVD- none , edema- none, stasis changes- none, varices- none           Lung- clear to P&A, wheeze- none, cough+ dry with deep breath , dullness-none, rub- none           Chest wall-  Abd-  Br/ Gen/ Rectal- Not done, not indicated Extrem- cyanosis- none, clubbing, none, atrophy- none, strength- nl Neuro- grossly intact to observation    Assessment & Plan:

## 2012-02-11 ENCOUNTER — Ambulatory Visit (INDEPENDENT_AMBULATORY_CARE_PROVIDER_SITE_OTHER): Payer: Medicare Other | Admitting: Gastroenterology

## 2012-02-11 ENCOUNTER — Encounter: Payer: Self-pay | Admitting: Gastroenterology

## 2012-02-11 VITALS — BP 154/78 | HR 60 | Temp 98.1°F | Ht 72.0 in | Wt 137.6 lb

## 2012-02-11 DIAGNOSIS — R079 Chest pain, unspecified: Secondary | ICD-10-CM

## 2012-02-11 DIAGNOSIS — K219 Gastro-esophageal reflux disease without esophagitis: Secondary | ICD-10-CM

## 2012-02-11 NOTE — Progress Notes (Signed)
Faxed to PCP

## 2012-02-11 NOTE — Assessment & Plan Note (Addendum)
Chest pain with recent unremarkable cardiac cath. Released from hospital on 02/02/12. Patient reports more issues with heartburn in the last couple of months. Recent switch in PPI to Dexilant from Protonix. Denies odynophagia or dysphagia. SOB, phlegm better on antibiotics but has to complete course of doxycyline given by allergist.   Would recommend EGD+/-ED with Dr. Jena Gauss once doxycyline completed. If all his symptoms resolve including "heartburn", we could consider holding off on EGD but given that he has required hospitalization for this, would highly recommend EGD (if any persistent symptoms at all.

## 2012-02-11 NOTE — Progress Notes (Signed)
Primary Care Physician:  MCGOUGH,WILLIAM M, MD  Primary Gastroenterologist:  Michael Rourk, MD   Chief Complaint  Patient presents with  . Dysphagia    HPI:  Dale [name] is a 78 y.o. male here for further evaluation of heartburn. He has had several month history of "esophageal pain" in the center of his chest. Referral was for dysphagia as well, but patient insist that he does not have difficulty swallowing. He was hospitalized in 01/2012 with chest pain, cardiac cath showed no significant blockages, patent stents on 02/02/12. Work-up for non-cardiac causes of chest pain recommended. Similar symptoms when hospitalized in 08/2011.   Seen by PCP on 02/06/12. Started on Z-pak for ?bronchitis? Protonix switched to Dexilant.  Helped some but allergist thought he needed stronger medication and gave him doxycycline which he is to start today. Little bit of yellow phelgm, but he questions if it is coming from his esophagus. Was having some SOB but that is better on abx. Denies dysphagia. After eating breakfast lot of heartburn. Had to stop eating bananas. No vomiting. Appetite good. No weight loss. BM regular. No melena, brbpr.   H/O remote candida esophagitis and h/o esophageal rings s/p dilation.   Current Outpatient Prescriptions  Medication Sig Dispense Refill  . aspirin 81 MG tablet Take 81 mg by mouth daily.        . clopidogrel (PLAVIX) 75 MG tablet Take 75 mg by mouth daily.       . dexlansoprazole (DEXILANT) 60 MG capsule Take 60 mg by mouth daily.      . dextromethorphan-guaiFENesin (MUCINEX DM) 30-600 MG per 12 hr tablet Take 1 tablet by mouth daily.       . dorzolamide (TRUSOPT) 2 % ophthalmic solution Place 1 drop into both eyes 2 (two) times daily.       . doxycycline (VIBRA-TABS) 100 MG tablet 2 today then one daily  8 tablet  0  . ezetimibe (ZETIA) 10 MG tablet Take 10 mg by mouth daily.        . finasteride (PROSCAR) 5 MG tablet Take 5 mg by mouth daily.        . hydrochlorothiazide  (MICROZIDE) 12.5 MG capsule Take 12.5 mg by mouth daily.      . isosorbide mononitrate (IMDUR) 60 MG 24 hr tablet Take 60 mg by mouth daily.        . levothyroxine (SYNTHROID, LEVOTHROID) 88 MCG tablet Take 88 mcg by mouth daily.        . losartan (COZAAR) 100 MG tablet Take 100 mg by mouth daily.        . metoprolol succinate (TOPROL-XL) 25 MG 24 hr tablet Take 25 mg by mouth 2 (two) times daily.       . Multiple Vitamin (MULTIVITAMIN) tablet Take 1 tablet by mouth daily.        . Multiple Vitamins-Minerals (PRESERVISION/LUTEIN) CAPS Take 2 capsules by mouth daily.       . nitroGLYCERIN (NITROSTAT) 0.4 MG SL tablet Place 0.4 mg under the tongue every 5 (five) minutes as needed. For chest pain      . PRESCRIPTION MEDICATION Inject 1 each as directed once a week. Allergy shot= thursday      . rosuvastatin (CRESTOR) 5 MG tablet Take 5 mg by mouth daily.        . timolol (TIMOPTIC) 0.5 % ophthalmic solution Place 1 drop into both eyes 2 (two) times daily.       . travoprost, benzalkonium, (TRAVATAN) 0.004 %   ophthalmic solution Place 1 drop into both eyes at bedtime.      . triamcinolone (KENALOG) 0.1 % cream Apply topically 2 (two) times daily as needed.        . vitamin C (ASCORBIC ACID) 500 MG tablet Take 500 mg by mouth daily.        . vitamin E 200 UNIT capsule Take 200 Units by mouth daily.          Allergies as of 02/11/2012 - Review Complete 02/11/2012  Allergen Reaction Noted  . Doxazosin Shortness Of Breath 02/11/2012  . Hydrocodone Nausea And Vomiting 08/15/2011  . Morphine    . Penicillins Nausea And Vomiting   . Sulfonamide derivatives Nausea And Vomiting    Please note: from encounters prior to 2009 he listed numerous drug allergies. I discussed with him today and he denied having any problems with the following drugs at this time: Tussin, Robitussin, Lopressor, Cardizem, ASA, Soma, Cytotec, Imdur, Tylenol, Dimetapp, Dristan, Tenormin, Procardia  Past Medical History  Diagnosis  Date  . Coronary heart disease     s/p stenting. cath in 01/2012 noncritical occlusion  . Idiopathic thrombocytopenic purpura 2002  . Allergic rhinitis   . Sarcoidosis     pulmonary  . Hyperlipidemia   . Hypertension   . Macular degeneration   . Hypothyroidism   . Chest pain   . GERD (gastroesophageal reflux disease)   . CHF (congestive heart failure)   . Cancer     skin  . Asthma   . Glaucoma   . Nephrolithiasis   . PUD (peptic ulcer disease)     remote    Past Surgical History  Procedure Date  . Mediastinoscopy     for dx sarcoid  . Coronary angioplasty with stent placement   . Cardiac stents   . Esophagogastroduodenoscopy 06/19/2004    Two esophageal rings and esophageal web as described above.  All of these were disrupted by passing 56-French Maloney dilator/ Candida esophagitis,which appears to be incidental given history of   antibiotic use, but nevertheless will be treated.  . Esophagogastroduodenoscopy 10/30/2006    Distal tandem esophageal ring status post dilation disruption as  described above.  Otherwise normal esophagus/  Small hiatal hernia otherwise normal stomach, D1 and D2  . Colonoscopy 10/30/2006    Normal rectum, sigmoid diverticula.Remainder of colonic mucosa appeared normal.    Family History  Problem Relation Age of Onset  . Heart disease Father     deceased age 70  . Stroke Mother   . Alzheimer's disease Mother   . Heart attack Brother     deceased age 48  . Colon cancer Neg Hx   . Cancer Other     niece    History   Social History  . Marital Status: Married    Spouse Name: N/A    Number of Children: 1  . Years of Education: N/A   Occupational History  . 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     Social History Main Topics  . Smoking status: Never Smoker   . Smokeless tobacco: Never Used  . Alcohol Use: No  . Drug Use: No  . Sexually Active: No   Other Topics Concern  . Not on file   Social History Narrative  . No  narrative on file      ROS:  General: Negative for anorexia, weight loss, fever, chills, fatigue, weakness. Eyes: Negative for vision changes.  ENT: Negative for   hoarseness, difficulty swallowing , nasal congestion. CV: Negative for chest pain, angina, palpitations, dyspnea on exertion, peripheral edema.  Respiratory: Negative for dyspnea at rest, dyspnea on exertion, cough, wheezing. Yellow sputum, improved. GI: See history of present illness. GU:  Negative for dysuria, hematuria, urinary incontinence, urinary frequency, nocturnal urination.  MS: Negative for joint pain, low back pain.  Derm: Negative for rash or itching.  Neuro: Negative for weakness, abnormal sensation, seizure, frequent headaches, memory loss, confusion.  Psych: Negative for anxiety, depression, suicidal ideation, hallucinations.  Endo: Negative for unusual weight change.  Heme: Negative for bruising or bleeding. Allergy: Negative for rash or hives.    Physical Examination:  BP 154/78  Pulse 60  Temp 98.1 F (36.7 C) (Temporal)  Ht 6' (1.829 m)  Wt 137 lb 9.6 oz (62.415 kg)  BMI 18.66 kg/m2   General: Well-nourished, well-developed in no acute distress. Thin, elderly. Head: Normocephalic, atraumatic.   Eyes: Conjunctiva pink, no icterus. Mouth: Oropharyngeal mucosa moist and pink , no lesions erythema or exudate. Neck: Supple without thyromegaly, masses, or lymphadenopathy.  Lungs: Clear to auscultation bilaterally.  Heart: Regular rate and rhythm, no murmurs rubs or gallops.  Abdomen: Bowel sounds are normal, nontender, nondistended, no hepatosplenomegaly or masses, no abdominal bruits or    hernia , no rebound or guarding.   Rectal: not performed Extremities: No lower extremity edema. No clubbing or deformities.  Neuro: Alert and oriented x 4 , grossly normal neurologically.  Skin: Warm and dry, no rash or jaundice.   Psych: Alert and cooperative, normal mood and affect.  Labs: Labs from  01/20/2012 Hemoglobin 13.7, hematocrit 41.2, creatinine 0.76, total bilirubin 0.7, alkaline phosphatase 82, AST 21, ALT 17, TSH 4.534.  Imaging Studies: Dg Chest 2 View  02/10/2012  *RADIOLOGY REPORT*  Clinical Data: Cough and pain  CHEST - 2 VIEW  Comparison: 02/02/2012 and 10/04/2011.  CT scan from 10/01/2011  Findings: Two-view exam shows hyperexpansion, consistent with emphysema. Cardiopericardial silhouette is at upper limits of normal for size.  Right hilar fullness is stable and was seen to represent confluent airspace opacity, better assessed on CT from 10/01/2011. Bone windows reveal no worrisome lytic or sclerotic osseous lesions.  IMPRESSION: Stable exam.  No new or progressive findings.   Original Report Authenticated By: ERIC A. MANSELL, M.D.    Dg Chest Port 1 View  02/02/2012  *RADIOLOGY REPORT*  Clinical Data: Chest pain. History of sarcoidosis.  PORTABLE CHEST - 1 VIEW  Comparison: 10/03/2008  Findings: Single view of the chest was obtained.  Stable appearance of the heart and mediastinum.  There are chronic densities in the right hilum and right upper lung region.  Stable elevation of the medial right hemidiaphragm.  No evidence for pulmonary edema.  IMPRESSION: Stable parenchymal densities in the right lung and right hilar region.  Findings are most likely secondary to sarcoidosis.  No evidence for pulmonary edema.   Original Report Authenticated By: ADAM HENN, M.D.       

## 2012-02-11 NOTE — Patient Instructions (Addendum)
We have scheduled you for an upper endoscopy with Dr. Jena Gauss to further evaluation your reflux symptoms and chest pain.  If your symptoms completely resolved after finishing your antibiotics, you can call and cancel your procedure. But if you are still having any symptoms of heartburn, difficulty swallowing, chest pain, you will need to have the upper endoscopy done.  Continue Dexilant daily for heartburn.

## 2012-02-12 NOTE — Progress Notes (Signed)
Quick Note:  Spoke with pt and notified of results of cxr and he verbalized understanding ______

## 2012-02-13 DIAGNOSIS — T7840XA Allergy, unspecified, initial encounter: Secondary | ICD-10-CM | POA: Diagnosis not present

## 2012-02-16 NOTE — Assessment & Plan Note (Addendum)
Residual scarring on chest x-ray. Hydropneumothorax this summer may have resulted from a torn adhesion for infected bleb Followup chest x-ray indicated resolution. Plan-chest x-ray

## 2012-02-16 NOTE — Assessment & Plan Note (Signed)
Tracheobronchitis Plan doxycycline, chest x-ray

## 2012-02-20 DIAGNOSIS — J309 Allergic rhinitis, unspecified: Secondary | ICD-10-CM | POA: Diagnosis not present

## 2012-02-26 DIAGNOSIS — H40059 Ocular hypertension, unspecified eye: Secondary | ICD-10-CM | POA: Diagnosis not present

## 2012-02-26 DIAGNOSIS — H04129 Dry eye syndrome of unspecified lacrimal gland: Secondary | ICD-10-CM | POA: Diagnosis not present

## 2012-02-26 DIAGNOSIS — H1045 Other chronic allergic conjunctivitis: Secondary | ICD-10-CM | POA: Diagnosis not present

## 2012-02-27 ENCOUNTER — Telehealth: Payer: Self-pay | Admitting: Internal Medicine

## 2012-02-27 ENCOUNTER — Encounter (HOSPITAL_COMMUNITY): Payer: Self-pay | Admitting: Pharmacy Technician

## 2012-02-27 DIAGNOSIS — J309 Allergic rhinitis, unspecified: Secondary | ICD-10-CM | POA: Diagnosis not present

## 2012-02-27 DIAGNOSIS — Z23 Encounter for immunization: Secondary | ICD-10-CM | POA: Diagnosis not present

## 2012-02-27 NOTE — Telephone Encounter (Signed)
Pt's wife called this afternoon saying that someone from [facility] called to verify his medications prior to his tcs on 10/30. The wife was told that this person from the hospital would not be there today and that she has an emergency surgery planned for tomorrow. She is worried that if she can not give the medications to someone at the hospital and she is not available tomorrow that her husband procedure will have to be [facility]. Please advise what she can do.

## 2012-02-27 NOTE — Telephone Encounter (Signed)
I talked to wife and told her that I would call the hospital and find out. I then called Scott in the pharmacy and he said that he would ask them to call her back today.

## 2012-03-04 ENCOUNTER — Ambulatory Visit (HOSPITAL_COMMUNITY)
Admission: RE | Admit: 2012-03-04 | Discharge: 2012-03-04 | Disposition: A | Discharge status: Home / Self Care | Payer: Medicare Other | Source: Ambulatory Visit | Attending: Internal Medicine | Admitting: Internal Medicine

## 2012-03-04 ENCOUNTER — Encounter (HOSPITAL_COMMUNITY)
Admission: RE | Disposition: A | Discharge status: Home / Self Care | Payer: Self-pay | Source: Ambulatory Visit | Attending: Internal Medicine

## 2012-03-04 ENCOUNTER — Encounter (HOSPITAL_COMMUNITY): Payer: Self-pay | Admitting: *Deleted

## 2012-03-04 DIAGNOSIS — I10 Essential (primary) hypertension: Secondary | ICD-10-CM | POA: Diagnosis not present

## 2012-03-04 DIAGNOSIS — K222 Esophageal obstruction: Secondary | ICD-10-CM

## 2012-03-04 DIAGNOSIS — K2990 Gastroduodenitis, unspecified, without bleeding: Secondary | ICD-10-CM

## 2012-03-04 DIAGNOSIS — K2970 Gastritis, unspecified, without bleeding: Secondary | ICD-10-CM | POA: Diagnosis not present

## 2012-03-04 DIAGNOSIS — R079 Chest pain, unspecified: Secondary | ICD-10-CM

## 2012-03-04 DIAGNOSIS — K219 Gastro-esophageal reflux disease without esophagitis: Secondary | ICD-10-CM

## 2012-03-04 DIAGNOSIS — K2940 Chronic atrophic gastritis without bleeding: Secondary | ICD-10-CM | POA: Insufficient documentation

## 2012-03-04 DIAGNOSIS — R1310 Dysphagia, unspecified: Secondary | ICD-10-CM | POA: Insufficient documentation

## 2012-03-04 HISTORY — PX: ESOPHAGOGASTRODUODENOSCOPY (EGD) WITH ESOPHAGEAL DILATION: SHX5812

## 2012-03-04 SURGERY — ESOPHAGOGASTRODUODENOSCOPY (EGD) WITH ESOPHAGEAL DILATION
Anesthesia: Moderate Sedation

## 2012-03-04 MED ORDER — MEPERIDINE HCL 100 MG/ML IJ SOLN
INTRAMUSCULAR | Status: AC
  Filled 2012-03-04: qty 1

## 2012-03-04 MED ORDER — STERILE WATER FOR IRRIGATION IR SOLN
Status: DC | PRN
  Administered 2012-03-04: 11:00:00

## 2012-03-04 MED ORDER — BUTAMBEN-TETRACAINE-BENZOCAINE 2-2-14 % EX AERO
INHALATION_SPRAY | CUTANEOUS | Status: DC | PRN
  Administered 2012-03-04: 2 via TOPICAL

## 2012-03-04 MED ORDER — MIDAZOLAM HCL 5 MG/5ML IJ SOLN
INTRAMUSCULAR | Status: DC | PRN
  Administered 2012-03-04: 2 mg via INTRAVENOUS

## 2012-03-04 MED ORDER — MIDAZOLAM HCL 5 MG/5ML IJ SOLN
INTRAMUSCULAR | Status: AC
  Filled 2012-03-04: qty 10

## 2012-03-04 MED ORDER — SODIUM CHLORIDE 0.45 % IV SOLN
INTRAVENOUS | Status: DC
  Administered 2012-03-04: 10:00:00 via INTRAVENOUS

## 2012-03-04 MED ORDER — MEPERIDINE HCL 100 MG/ML IJ SOLN
INTRAMUSCULAR | Status: DC | PRN
  Administered 2012-03-04: 50 mg via INTRAVENOUS

## 2012-03-04 NOTE — Interval H&P Note (Signed)
History and Physical Interval Note:  03/04/2012 10:55 AM  Dale [name]  has presented today for surgery, with the diagnosis of Atypical chest pain and GERD  The various methods of treatment have been discussed with the patient and family. After consideration of risks, benefits and other options for treatment, the patient has consented to  Procedure(s) (LRB) with comments: ESOPHAGOGASTRODUODENOSCOPY (EGD) WITH ESOPHAGEAL DILATION (N/A) - 10:55 moved down due to ercp per dr Jena Gauss patient notified by me Efraim Kaufmann 12:08 pm as a surgical intervention .  The patient's history has been reviewed, patient examined, no change in status, stable for surgery.  I have reviewed the patient's chart and labs.  Questions were answered to the patient's satisfaction.     [name] As above. Patient admits to dysphagia with pills. Recently took doxycycline. I discussed with him the possibility of a conservative out dilation (on Plavix). Risk and benefits reviewed. He is agreeable.

## 2012-03-04 NOTE — H&P (View-Only) (Signed)
Primary Care Physician:  Kirk Ruths, MD  Primary Gastroenterologist:  Roetta Sessions, MD   Chief Complaint  Patient presents with  . Dysphagia    HPI:  Dale [name] is a 78 y.o. male here for further evaluation of heartburn. He has had several month history of "esophageal pain" in the center of his chest. Referral was for dysphagia as well, but patient insist that he does not have difficulty swallowing. He was hospitalized in 01/2012 with chest pain, cardiac cath showed no significant blockages, patent stents on 02/02/12. Work-up for non-cardiac causes of chest pain recommended. Similar symptoms when hospitalized in 08/2011.   Seen by PCP on 02/06/12. Started on Z-pak for ?bronchitis? Protonix switched to Dexilant.  Helped some but allergist thought he needed stronger medication and gave him doxycycline which he is to start today. Little bit of yellow phelgm, but he questions if it is coming from his esophagus. Was having some SOB but that is better on abx. Denies dysphagia. After eating breakfast lot of heartburn. Had to stop eating bananas. No vomiting. Appetite good. No weight loss. BM regular. No melena, brbpr.   H/O remote candida esophagitis and h/o esophageal rings s/p dilation.   Current Outpatient Prescriptions  Medication Sig Dispense Refill  . aspirin 81 MG tablet Take 81 mg by mouth daily.        . clopidogrel (PLAVIX) 75 MG tablet Take 75 mg by mouth daily.       Marland Kitchen dexlansoprazole (DEXILANT) 60 MG capsule Take 60 mg by mouth daily.      Marland Kitchen dextromethorphan-guaiFENesin (MUCINEX DM) 30-600 MG per 12 hr tablet Take 1 tablet by mouth daily.       . dorzolamide (TRUSOPT) 2 % ophthalmic solution Place 1 drop into both eyes 2 (two) times daily.       Marland Kitchen doxycycline (VIBRA-TABS) 100 MG tablet 2 today then one daily  8 tablet  0  . ezetimibe (ZETIA) 10 MG tablet Take 10 mg by mouth daily.        . finasteride (PROSCAR) 5 MG tablet Take 5 mg by mouth daily.        . hydrochlorothiazide  (MICROZIDE) 12.5 MG capsule Take 12.5 mg by mouth daily.      . isosorbide mononitrate (IMDUR) 60 MG 24 hr tablet Take 60 mg by mouth daily.        Marland Kitchen levothyroxine (SYNTHROID, LEVOTHROID) 88 MCG tablet Take 88 mcg by mouth daily.        Marland Kitchen losartan (COZAAR) 100 MG tablet Take 100 mg by mouth daily.        . metoprolol succinate (TOPROL-XL) 25 MG 24 hr tablet Take 25 mg by mouth 2 (two) times daily.       . Multiple Vitamin (MULTIVITAMIN) tablet Take 1 tablet by mouth daily.        . Multiple Vitamins-Minerals (PRESERVISION/LUTEIN) CAPS Take 2 capsules by mouth daily.       . nitroGLYCERIN (NITROSTAT) 0.4 MG SL tablet Place 0.4 mg under the tongue every 5 (five) minutes as needed. For chest pain      . PRESCRIPTION MEDICATION Inject 1 each as directed once a week. Allergy shot= thursday      . rosuvastatin (CRESTOR) 5 MG tablet Take 5 mg by mouth daily.        . timolol (TIMOPTIC) 0.5 % ophthalmic solution Place 1 drop into both eyes 2 (two) times daily.       . travoprost, benzalkonium, (TRAVATAN) 0.004 %  ophthalmic solution Place 1 drop into both eyes at bedtime.      . triamcinolone (KENALOG) 0.1 % cream Apply topically 2 (two) times daily as needed.        . vitamin C (ASCORBIC ACID) 500 MG tablet Take 500 mg by mouth daily.        . vitamin E 200 UNIT capsule Take 200 Units by mouth daily.          Allergies as of 02/11/2012 - Review Complete 02/11/2012  Allergen Reaction Noted  . Doxazosin Shortness Of Breath 02/11/2012  . Hydrocodone Nausea And Vomiting 08/15/2011  . Morphine    . Penicillins Nausea And Vomiting   . Sulfonamide derivatives Nausea And Vomiting    Please note: from encounters prior to 2009 he listed numerous drug allergies. I discussed with him today and he denied having any problems with the following drugs at this time: Tussin, Robitussin, Lopressor, Cardizem, ASA, Soma, Cytotec, Imdur, Tylenol, Dimetapp, Dristan, Tenormin, Procardia  Past Medical History  Diagnosis  Date  . Coronary heart disease     s/p stenting. cath in 01/2012 noncritical occlusion  . Idiopathic thrombocytopenic purpura 2002  . Allergic rhinitis   . Sarcoidosis     pulmonary  . Hyperlipidemia   . Hypertension   . Macular degeneration   . Hypothyroidism   . Chest pain   . GERD (gastroesophageal reflux disease)   . CHF (congestive heart failure)   . Cancer     skin  . Asthma   . Glaucoma   . Nephrolithiasis   . PUD (peptic ulcer disease)     remote    Past Surgical History  Procedure Date  . Mediastinoscopy     for dx sarcoid  . Coronary angioplasty with stent placement   . Cardiac stents   . Esophagogastroduodenoscopy 06/19/2004    Two esophageal rings and esophageal web as described above.  All of these were disrupted by passing 56-French Elease Hashimoto dilator/ Candida esophagitis,which appears to be incidental given history of   antibiotic use, but nevertheless will be treated.  . Esophagogastroduodenoscopy 10/30/2006    Distal tandem esophageal ring status post dilation disruption as  described above.  Otherwise normal esophagus/  Small hiatal hernia otherwise normal stomach, D1 and D2  . Colonoscopy 10/30/2006    Normal rectum, sigmoid diverticula.Remainder of colonic mucosa appeared normal.    Family History  Problem Relation Age of Onset  . Heart disease Father     deceased age 58  . Stroke Mother   . Alzheimer's disease Mother   . Heart attack Brother     deceased age 52  . Colon cancer Neg Hx   . Cancer Other     niece    History   Social History  . Marital Status: Married    Spouse Name: N/A    Number of Children: 1  . Years of Education: N/A   Occupational History  . 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     Social History Main Topics  . Smoking status: Never Smoker   . Smokeless tobacco: Never Used  . Alcohol Use: No  . Drug Use: No  . Sexually Active: No   Other Topics Concern  . Not on file   Social History Narrative  . No  narrative on file      ROS:  General: Negative for anorexia, weight loss, fever, chills, fatigue, weakness. Eyes: Negative for vision changes.  ENT: Negative for  hoarseness, difficulty swallowing , nasal congestion. CV: Negative for chest pain, angina, palpitations, dyspnea on exertion, peripheral edema.  Respiratory: Negative for dyspnea at rest, dyspnea on exertion, cough, wheezing. Yellow sputum, improved. GI: See history of present illness. GU:  Negative for dysuria, hematuria, urinary incontinence, urinary frequency, nocturnal urination.  MS: Negative for joint pain, low back pain.  Derm: Negative for rash or itching.  Neuro: Negative for weakness, abnormal sensation, seizure, frequent headaches, memory loss, confusion.  Psych: Negative for anxiety, depression, suicidal ideation, hallucinations.  Endo: Negative for unusual weight change.  Heme: Negative for bruising or bleeding. Allergy: Negative for rash or hives.    Physical Examination:  BP 154/78  Pulse 60  Temp 98.1 F (36.7 C) (Temporal)  Ht 6' (1.829 m)  Wt 137 lb 9.6 oz (62.415 kg)  BMI 18.66 kg/m2   General: Well-nourished, well-developed in no acute distress. Thin, elderly. Head: Normocephalic, atraumatic.   Eyes: Conjunctiva pink, no icterus. Mouth: Oropharyngeal mucosa moist and pink , no lesions erythema or exudate. Neck: Supple without thyromegaly, masses, or lymphadenopathy.  Lungs: Clear to auscultation bilaterally.  Heart: Regular rate and rhythm, no murmurs rubs or gallops.  Abdomen: Bowel sounds are normal, nontender, nondistended, no hepatosplenomegaly or masses, no abdominal bruits or    hernia , no rebound or guarding.   Rectal: not performed Extremities: No lower extremity edema. No clubbing or deformities.  Neuro: Alert and oriented x 4 , grossly normal neurologically.  Skin: Warm and dry, no rash or jaundice.   Psych: Alert and cooperative, normal mood and affect.  Labs: Labs from  01/20/2012 Hemoglobin 13.7, hematocrit 41.2, creatinine 0.76, total bilirubin 0.7, alkaline phosphatase 82, AST 21, ALT 17, TSH 4.534.  Imaging Studies: Dg Chest 2 View  02/10/2012  *RADIOLOGY REPORT*  Clinical Data: Cough and pain  CHEST - 2 VIEW  Comparison: 02/02/2012 and 10/04/2011.  CT scan from 10/01/2011  Findings: Two-view exam shows hyperexpansion, consistent with emphysema. Cardiopericardial silhouette is at upper limits of normal for size.  Right hilar fullness is stable and was seen to represent confluent airspace opacity, better assessed on CT from 10/01/2011. Bone windows reveal no worrisome lytic or sclerotic osseous lesions.  IMPRESSION: Stable exam.  No new or progressive findings.   Original Report Authenticated By: ERIC A. MANSELL, M.D.    Dg Chest Port 1 View  02/02/2012  *RADIOLOGY REPORT*  Clinical Data: Chest pain. History of sarcoidosis.  PORTABLE CHEST - 1 VIEW  Comparison: 10/03/2008  Findings: Single view of the chest was obtained.  Stable appearance of the heart and mediastinum.  There are chronic densities in the right hilum and right upper lung region.  Stable elevation of the medial right hemidiaphragm.  No evidence for pulmonary edema.  IMPRESSION: Stable parenchymal densities in the right lung and right hilar region.  Findings are most likely secondary to sarcoidosis.  No evidence for pulmonary edema.   Original Report Authenticated By: Richarda Overlie, M.D.

## 2012-03-04 NOTE — Op Note (Signed)
[facility] [address] [place], [address]   ENDOSCOPY PROCEDURE REPORT  PATIENT: Marco, Raper.  MR#: [id number] BIRTHDATE: April 27, 1929 , 83  yrs. old GENDER: Male ENDOSCOPIST: R.  Roetta Sessions, MD FACP FACG REFERRED BY:  Karleen Hampshire, M.D.  Lennette Bihari, M.D. PROCEDURE DATE:  03/04/2012 PROCEDURE:     EGD with Elease Hashimoto dilation followed by gastric biopsy  INDICATIONS:     Recent bout of atypical chest pain - not felt to be cardiac in origin; known Schatzki's ring; recent pill dysphagia  INFORMED CONSENT:   The risks, benefits, limitations, alternatives and imponderables have been discussed.  The potential for biopsy, esophogeal dilation, etc. have also been reviewed.  Questions have been answered.  All parties agreeable.  Please see the history and physical in the medical record for more information.  MEDICATIONS:    Versed 2 mg IV and Demerol 50 mg IV. Cetacaine spray.  DESCRIPTION OF PROCEDURE:   The EG-[id number] ([id number])  endoscope was introduced through the mouth and advanced to the second portion of the duodenum without difficulty or limitations.  The mucosal surfaces were surveyed very carefully during advancement of the scope and upon withdrawal.  Retroflexion view of the proximal stomach and esophagogastric junction was performed.      FINDINGS: Prominent Schatzki's ring; otherwise, the esophageal mucosa appeared normal. Stomach empty. Small hiatal hernia. Patchy gastric erosions and small area of polypoid mucosa. No ulcer or infiltrating process. Patent pylorus. Normal first and second portion of the duodenum  THERAPEUTIC / DIAGNOSTIC MANEUVERS PERFORMED:  A 56 French Maloney dilator was passed to full insertion easily. A look back revealed appropriate disruption of the ring and a small tear through the upper esophageal mucosa. There was minimal bleeding. No apparent complication related to this maneuver.   Subsequently,  Biopsies  of abnormal gastric mucosa taken for histologic study   COMPLICATIONS:  None  IMPRESSION:  Schatzki's ring-status post dilation disruption as described above. Hiatal hernia. Polypoid gastric mucosa-status post biopsy  RECOMMENDATIONS:  Continue Dexilant. Swallowing precautions reviewed.   Further recommendations to follow pending review of pathology report.    _______________________________ R. Roetta Sessions, MD FACP [facility] eSigned:  R. Roetta Sessions, MD FACP [facility] 03/04/2012 11:25 AM     CC:  PATIENT NAME:  Kennon Portela. MR#: [id number]

## 2012-03-05 ENCOUNTER — Encounter (HOSPITAL_COMMUNITY): Payer: Self-pay | Admitting: Internal Medicine

## 2012-03-05 DIAGNOSIS — J309 Allergic rhinitis, unspecified: Secondary | ICD-10-CM | POA: Diagnosis not present

## 2012-03-08 ENCOUNTER — Encounter: Payer: Self-pay | Admitting: Internal Medicine

## 2012-03-09 ENCOUNTER — Other Ambulatory Visit: Payer: Self-pay | Admitting: Gastroenterology

## 2012-03-09 ENCOUNTER — Encounter: Payer: Self-pay | Admitting: *Deleted

## 2012-03-09 MED ORDER — DEXLANSOPRAZOLE 60 MG PO CPDR: 60.0000 mg | DELAYED_RELEASE_CAPSULE | Freq: Every day | ORAL | Status: DC

## 2012-03-10 DIAGNOSIS — E039 Hypothyroidism, unspecified: Secondary | ICD-10-CM | POA: Diagnosis not present

## 2012-03-10 DIAGNOSIS — E782 Mixed hyperlipidemia: Secondary | ICD-10-CM | POA: Diagnosis not present

## 2012-03-10 DIAGNOSIS — K219 Gastro-esophageal reflux disease without esophagitis: Secondary | ICD-10-CM | POA: Diagnosis not present

## 2012-03-10 DIAGNOSIS — I2510 Atherosclerotic heart disease of native coronary artery without angina pectoris: Secondary | ICD-10-CM | POA: Diagnosis not present

## 2012-03-12 DIAGNOSIS — J309 Allergic rhinitis, unspecified: Secondary | ICD-10-CM | POA: Diagnosis not present

## 2012-03-19 DIAGNOSIS — J309 Allergic rhinitis, unspecified: Secondary | ICD-10-CM | POA: Diagnosis not present

## 2012-03-26 DIAGNOSIS — T7840XA Allergy, unspecified, initial encounter: Secondary | ICD-10-CM | POA: Diagnosis not present

## 2012-04-01 DIAGNOSIS — J309 Allergic rhinitis, unspecified: Secondary | ICD-10-CM | POA: Diagnosis not present

## 2012-04-09 DIAGNOSIS — J309 Allergic rhinitis, unspecified: Secondary | ICD-10-CM | POA: Diagnosis not present

## 2012-04-16 DIAGNOSIS — T7840XA Allergy, unspecified, initial encounter: Secondary | ICD-10-CM | POA: Diagnosis not present

## 2012-04-20 DIAGNOSIS — D235 Other benign neoplasm of skin of trunk: Secondary | ICD-10-CM | POA: Diagnosis not present

## 2012-04-20 DIAGNOSIS — L570 Actinic keratosis: Secondary | ICD-10-CM | POA: Diagnosis not present

## 2012-04-20 DIAGNOSIS — L820 Inflamed seborrheic keratosis: Secondary | ICD-10-CM | POA: Diagnosis not present

## 2012-04-23 DIAGNOSIS — J309 Allergic rhinitis, unspecified: Secondary | ICD-10-CM | POA: Diagnosis not present

## 2012-05-01 DIAGNOSIS — T7840XA Allergy, unspecified, initial encounter: Secondary | ICD-10-CM | POA: Diagnosis not present

## 2012-05-07 DIAGNOSIS — J309 Allergic rhinitis, unspecified: Secondary | ICD-10-CM | POA: Diagnosis not present

## 2012-05-11 DIAGNOSIS — Z79899 Other long term (current) drug therapy: Secondary | ICD-10-CM | POA: Diagnosis not present

## 2012-05-11 DIAGNOSIS — R42 Dizziness and giddiness: Secondary | ICD-10-CM | POA: Diagnosis not present

## 2012-05-11 DIAGNOSIS — Z681 Body mass index (BMI) 19 or less, adult: Secondary | ICD-10-CM | POA: Diagnosis not present

## 2012-05-11 DIAGNOSIS — R7309 Other abnormal glucose: Secondary | ICD-10-CM | POA: Diagnosis not present

## 2012-05-14 DIAGNOSIS — J309 Allergic rhinitis, unspecified: Secondary | ICD-10-CM | POA: Diagnosis not present

## 2012-05-21 DIAGNOSIS — J309 Allergic rhinitis, unspecified: Secondary | ICD-10-CM | POA: Diagnosis not present

## 2012-05-28 DIAGNOSIS — T7840XA Allergy, unspecified, initial encounter: Secondary | ICD-10-CM | POA: Diagnosis not present

## 2012-05-29 ENCOUNTER — Encounter: Payer: Self-pay | Admitting: Internal Medicine

## 2012-05-29 ENCOUNTER — Ambulatory Visit (INDEPENDENT_AMBULATORY_CARE_PROVIDER_SITE_OTHER): Payer: Medicare Other | Admitting: Internal Medicine

## 2012-05-29 VITALS — BP 154/69 | HR 55 | Temp 97.6°F | Ht 72.0 in | Wt 137.4 lb

## 2012-05-29 DIAGNOSIS — R1310 Dysphagia, unspecified: Secondary | ICD-10-CM

## 2012-05-29 DIAGNOSIS — K219 Gastro-esophageal reflux disease without esophagitis: Secondary | ICD-10-CM | POA: Diagnosis not present

## 2012-05-29 NOTE — Patient Instructions (Addendum)
Continue Dexilant 60 mg daily  Office visit in 1 year  Call me if you develop worsening swallowing problems

## 2012-05-29 NOTE — Progress Notes (Signed)
Primary Care Physician:  Kirk Ruths, MD Primary Gastroenterologist:  Dr. Jena Gauss  Pre-Procedure History & Physical: HPI:  Dale [name] is a 79 y.o. male here for followup of GERD and esophageal dysphagia. Underwent EGD with Elease Hashimoto dilation of the Schatzki's ring on October 30 of last year. Patient states overall, he is swallowing pretty well.  He has occasional difficulties with pills and water but this is infrequent-no more than one episode monthly. No reflux symptoms. No bowel pain. This is a routine followup appointment today. Denies melena or hematochezia. He moves his bowels every day. He is on Plavix. Would like to try something cheaper than Dexilant. Protonix did not work very well.  Past Medical History  Diagnosis Date  . Coronary heart disease     s/p stenting. cath in 01/2012 noncritical occlusion  . Idiopathic thrombocytopenic purpura 2002  . Allergic rhinitis   . Sarcoidosis     pulmonary  . Hyperlipidemia   . Hypertension   . Macular degeneration   . Hypothyroidism   . Chest pain   . GERD (gastroesophageal reflux disease)   . CHF (congestive heart failure)   . Cancer     skin  . Asthma   . Glaucoma   . Nephrolithiasis   . PUD (peptic ulcer disease)     remote  . Schatzki's ring   . Hiatal hernia   . Anal fissure     Past Surgical History  Procedure Date  . Mediastinoscopy     for dx sarcoid  . Coronary angioplasty with stent placement   . Cardiac stents   . Esophagogastroduodenoscopy 06/19/2004    Two esophageal rings and esophageal web as described above.  All of these were disrupted by passing 56-French Elease Hashimoto dilator/ Candida esophagitis,which appears to be incidental given history of   antibiotic use, but nevertheless will be treated.  . Esophagogastroduodenoscopy 10/30/2006    Distal tandem esophageal ring status post dilation disruption as  described above.  Otherwise normal esophagus/  Small hiatal hernia otherwise normal stomach, D1 and D2  .  Colonoscopy 10/30/2006    Normal rectum, sigmoid diverticula.Remainder of colonic mucosa appeared normal.  . Esophagogastroduodenoscopy (egd) with esophageal dilation 03/04/2012    RMR- schatzki's ring, hiatal hernia, polypoid gastric mucosa, bx= minimally active gastritis.    Prior to Admission medications   Medication Sig Start Date End Date Taking? Authorizing Provider  aspirin 81 MG tablet Take 81 mg by mouth daily.     Yes Historical Provider, MD  clopidogrel (PLAVIX) 75 MG tablet Take 75 mg by mouth daily.    Yes Historical Provider, MD  dexlansoprazole (DEXILANT) 60 MG capsule Take 1 capsule (60 mg total) by mouth daily. 03/09/12  Yes Nira Retort, NP  dextromethorphan-guaiFENesin [facility] DM) 30-600 MG per 12 hr tablet Take 1 tablet by mouth daily.    Yes Historical Provider, MD  dorzolamide (TRUSOPT) 2 % ophthalmic solution Place 1 drop into both eyes 2 (two) times daily.    Yes Historical Provider, MD  ezetimibe (ZETIA) 10 MG tablet Take 10 mg by mouth daily.     Yes Historical Provider, MD  finasteride (PROSCAR) 5 MG tablet Take 5 mg by mouth daily.     Yes Historical Provider, MD  hydrochlorothiazide (MICROZIDE) 12.5 MG capsule Take 12.5 mg by mouth daily.   Yes Historical Provider, MD  isosorbide mononitrate (IMDUR) 60 MG 24 hr tablet Take 60 mg by mouth daily.     Yes Historical Provider, MD  levothyroxine (SYNTHROID,  LEVOTHROID) 100 MCG tablet Take 100 mcg by mouth daily.   Yes Historical Provider, MD  losartan (COZAAR) 100 MG tablet Take 100 mg by mouth daily.     Yes Historical Provider, MD  metoprolol succinate (TOPROL-XL) 25 MG 24 hr tablet Take 25 mg by mouth 2 (two) times daily.    Yes Historical Provider, MD  Multiple Vitamin (MULTIVITAMIN) tablet Take 1 tablet by mouth daily.     Yes Historical Provider, MD  Multiple Vitamins-Minerals (PRESERVISION/LUTEIN) CAPS Take 2 capsules by mouth daily.    Yes Historical Provider, MD  nitroGLYCERIN (NITROSTAT) 0.4 MG SL tablet Place  0.4 mg under the tongue every 5 (five) minutes as needed. For chest pain   Yes Historical Provider, MD  PRESCRIPTION MEDICATION Inject 1 each as directed once a week. Allergy shot= thursday   Yes Historical Provider, MD  rosuvastatin (CRESTOR) 5 MG tablet Take 5 mg by mouth daily.     Yes Historical Provider, MD  timolol (TIMOPTIC) 0.5 % ophthalmic solution Place 1 drop into both eyes 2 (two) times daily.    Yes Historical Provider, MD  travoprost, benzalkonium, (TRAVATAN) 0.004 % ophthalmic solution Place 1 drop into both eyes at bedtime.   Yes Historical Provider, MD  triamcinolone (KENALOG) 0.1 % cream Apply topically 2 (two) times daily as needed.     Yes Historical Provider, MD  vitamin C (ASCORBIC ACID) 500 MG tablet Take 500 mg by mouth daily.     Yes Historical Provider, MD  vitamin E 200 UNIT capsule Take 200 Units by mouth daily.     Yes Historical Provider, MD  doxycycline (VIBRA-TABS) 100 MG tablet 2 today then one daily 02/10/12 02/09/13  Waymon Budge, MD    Allergies as of 05/29/2012 - Review Complete 05/29/2012  Allergen Reaction Noted  . Doxazosin Shortness Of Breath 02/11/2012  . Hydrocodone Nausea And Vomiting 08/15/2011  . Morphine    . Penicillins Nausea And Vomiting   . Sulfonamide derivatives Nausea And Vomiting     Family History  Problem Relation Age of Onset  . Heart disease Father     deceased age 7  . Stroke Mother   . Alzheimer's disease Mother   . Heart attack Brother     deceased age 67  . Colon cancer Neg Hx   . Cancer Other     niece    History   Social History  . Marital Status: Married    Spouse Name: N/A    Number of Children: 1  . Years of Education: N/A   Occupational History  . 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     Social History Main Topics  . Smoking status: Never Smoker   . Smokeless tobacco: Never Used  . Alcohol Use: No  . Drug Use: No  . Sexually Active: No   Other Topics Concern  . Not on file   Social  History Narrative  . No narrative on file    Review of Systems: See HPI, otherwise negative ROS  Physical Exam: BP 154/69  Pulse 55  Temp 97.6 F (36.4 C) (Oral)  Ht 6' (1.829 m)  Wt 137 lb 6.4 oz (62.324 kg)  BMI 18.63 kg/m2 General:   Frail elderly gentleman. pleasant and cooperative in NAD Skin:  Intact without significant lesions or rashes. Eyes:  Sclera clear, no icterus.   Conjunctiva pink. Ears:  Normal auditory acuity. Nose:  No deformity, discharge,  or lesions. Mouth:  No deformity or lesions. Neck:  Supple; no masses or thyromegaly. No significant cervical adenopathy. Lungs:  Clear throughout to auscultation.   No wheezes, crackles, or rhonchi. No acute distress. Heart:  Regular rate and rhythm; no murmurs, clicks, rubs,  or gallops. Abdomen: Non-distended, normal bowel sounds.  Soft and nontender without appreciable mass or hepatosplenomegaly.  Pulses:  Normal pulses noted. Extremities:  Without clubbing or edema.  Impression/Plan:  Pleasant 79 year old gentleman - status post dilation of a Schatzki's ring doing well now on Dexilant. He has rare episodes of pill and liquid dysphagia. This is nonspecific. He could certainly have an underlying esophageal motility disorder. His symptoms are relatively few and far between. I feel the benefits of chronic acid suppression therapy outweigh the risks. Dexilant is probably the best agent for him at this time the.   It is certainly possible he could have had an element of pill-induced esophageal injury (doxycycline) last fall.   Recommendations: Continue Dexilant 60 mg daily. We'll provide him samples as they become available. Office visit with [place] in one year. Should he have any interim worsening of swallowing difficulties he is to let us know

## 2012-06-04 DIAGNOSIS — J309 Allergic rhinitis, unspecified: Secondary | ICD-10-CM | POA: Diagnosis not present

## 2012-06-11 DIAGNOSIS — T7840XA Allergy, unspecified, initial encounter: Secondary | ICD-10-CM | POA: Diagnosis not present

## 2012-06-19 DIAGNOSIS — T7840XA Allergy, unspecified, initial encounter: Secondary | ICD-10-CM | POA: Diagnosis not present

## 2012-06-20 DIAGNOSIS — N390 Urinary tract infection, site not specified: Secondary | ICD-10-CM | POA: Diagnosis not present

## 2012-06-20 DIAGNOSIS — E039 Hypothyroidism, unspecified: Secondary | ICD-10-CM | POA: Diagnosis not present

## 2012-06-20 DIAGNOSIS — Z681 Body mass index (BMI) 19 or less, adult: Secondary | ICD-10-CM | POA: Diagnosis not present

## 2012-06-20 DIAGNOSIS — I10 Essential (primary) hypertension: Secondary | ICD-10-CM | POA: Diagnosis not present

## 2012-06-25 DIAGNOSIS — J309 Allergic rhinitis, unspecified: Secondary | ICD-10-CM | POA: Diagnosis not present

## 2012-07-02 DIAGNOSIS — T7840XA Allergy, unspecified, initial encounter: Secondary | ICD-10-CM | POA: Diagnosis not present

## 2012-07-16 DIAGNOSIS — T7840XA Allergy, unspecified, initial encounter: Secondary | ICD-10-CM | POA: Diagnosis not present

## 2012-07-17 DIAGNOSIS — H04129 Dry eye syndrome of unspecified lacrimal gland: Secondary | ICD-10-CM | POA: Diagnosis not present

## 2012-07-17 DIAGNOSIS — H40059 Ocular hypertension, unspecified eye: Secondary | ICD-10-CM | POA: Diagnosis not present

## 2012-07-17 DIAGNOSIS — H3530 Unspecified macular degeneration: Secondary | ICD-10-CM | POA: Diagnosis not present

## 2012-07-17 DIAGNOSIS — Z961 Presence of intraocular lens: Secondary | ICD-10-CM | POA: Diagnosis not present

## 2012-07-20 DIAGNOSIS — L821 Other seborrheic keratosis: Secondary | ICD-10-CM | POA: Diagnosis not present

## 2012-07-20 DIAGNOSIS — L570 Actinic keratosis: Secondary | ICD-10-CM | POA: Diagnosis not present

## 2012-07-23 DIAGNOSIS — T7840XA Allergy, unspecified, initial encounter: Secondary | ICD-10-CM | POA: Diagnosis not present

## 2012-07-23 DIAGNOSIS — E039 Hypothyroidism, unspecified: Secondary | ICD-10-CM | POA: Diagnosis not present

## 2012-07-27 DIAGNOSIS — IMO0002 Reserved for concepts with insufficient information to code with codable children: Secondary | ICD-10-CM | POA: Diagnosis not present

## 2012-07-27 DIAGNOSIS — E039 Hypothyroidism, unspecified: Secondary | ICD-10-CM | POA: Diagnosis not present

## 2012-07-27 DIAGNOSIS — R300 Dysuria: Secondary | ICD-10-CM | POA: Diagnosis not present

## 2012-07-31 DIAGNOSIS — J309 Allergic rhinitis, unspecified: Secondary | ICD-10-CM | POA: Diagnosis not present

## 2012-08-06 DIAGNOSIS — J309 Allergic rhinitis, unspecified: Secondary | ICD-10-CM | POA: Diagnosis not present

## 2012-08-10 ENCOUNTER — Ambulatory Visit (INDEPENDENT_AMBULATORY_CARE_PROVIDER_SITE_OTHER): Payer: Medicare Other | Admitting: Internal Medicine

## 2012-08-10 ENCOUNTER — Encounter: Payer: Self-pay | Admitting: Internal Medicine

## 2012-08-10 VITALS — BP 138/80 | HR 65 | Ht 69.75 in | Wt 134.8 lb

## 2012-08-10 DIAGNOSIS — J302 Other seasonal allergic rhinitis: Secondary | ICD-10-CM

## 2012-08-10 DIAGNOSIS — J45909 Unspecified asthma, uncomplicated: Secondary | ICD-10-CM

## 2012-08-10 DIAGNOSIS — J309 Allergic rhinitis, unspecified: Secondary | ICD-10-CM

## 2012-08-10 NOTE — Patient Instructions (Addendum)
We can continue allergy vaccine 1:50 GO  Please call as needed

## 2012-08-10 NOTE — Progress Notes (Signed)
Subjective:    Patient ID: Dale [name], male    DOB: 1928/06/06, 79 y.o.   MRN: [id number]  HPI 12/27/10-79 year old male never smoker followed for Allergic rhinitis, asthma,  hx sarcoid, glaucoma, history ITP Last here Sep 21, 2009-note reviewed. Allergy vaccine - continues long term. We discussed option to stop or retest.  CO throat tickle at night. Eating or drink, or melting ice in mouth will break it loose. Takes daily mucinex. Feels travatan eye drop get into nose and cause some swelling  02/10/12- 79 year old male never smoker followed for Allergic rhinitis, asthma,  hx sarcoid, complicated by glaucoma, history ITP Still on allergy vaccine 1:50 G0 and doing well with it; C/O yellow thick phelgm in throat Wants to wait on flu vaccine. In May had right hydropneumothorax on CT scan. Last week was in [place] for 2 days with left anterior chest pain/heart cath-"not heart". Some throat clearing of yellow phlegm but otherwise feels well. Had been having reflux. Dr Regino Schultze treated Z-Pak, ending today. CT chest 10/01/11-reviewed with him IMPRESSION:  1. No evidence of pulmonary embolism. Mildly motion degraded  exam.  2. Development of a right-sided hydropneumothorax, small volume.  Critical test results telephoned to [facility], rn. at the time of  interpretation at 12:38 p.m. on 10/01/2011.  3. Findings of sarcoidosis, similar to on the prior exam. No  evidence of superimposed pneumonia or airspace disease.  4. Pulmonary artery enlargement suggests pulmonary arterial  hypertension.  Original Report Authenticated By: Consuello Bossier, M.D.  CXR 02/02/12-reviewed with him IMPRESSION:  Stable parenchymal densities in the right lung and right hilar  region. Findings are most likely secondary to sarcoidosis.  No evidence for pulmonary edema.  Original Report Authenticated By: Richarda Overlie, M.D.   08/10/12- 79 year old male never smoker followed for Allergic rhinitis, asthma,  hx sarcoid,  complicated by glaucoma, history ITP FOLLOWS FOR: still on allergy vaccine 1:50 GO and denies any flare ups at this time. Doxycycline at last office visit cleared his bronchitis. He is comfortable so far in the early spring. CXR 02/12/12 IMPRESSION:  Stable exam. No new or progressive findings.  Original Report Authenticated By: ERIC A. MANSELL, M.D.  Review of Systems- see HPI Constitutional:   No-   weight loss, night sweats, fevers, chills, fatigue, lassitude. HEENT:   No-  headaches, difficulty swallowing, tooth/dental problems, sore throat,       No-  sneezing, itching, ear ache, + nasal congestion, post nasal drip,  CV:  No-   chest pain, orthopnea, PND, swelling in lower extremities, anasarca, dizziness, palpitations Resp: No-acute shortness of breath with exertion or at rest.             No- productive cough,  No non-productive cough,  No-  coughing up of blood.              No-   change in color of mucus.  No- wheezing.   Skin: No-   rash or lesions. GI:  No-   heartburn, indigestion, abdominal pain, nausea, vomiting,  GU:  MS:  No-   joint pain or swelling.   Neuro- nothing unusual  Psych:  No- change in mood or affect. No depression or anxiety.  No memory loss.    Objective:   Physical Exam General- Alert, Oriented, Affect-appropriate, Distress- none acute   comfortable appearing elderly man Skin- rash-none, lesions- none, excoriation- none Lymphadenopathy- none Head- atraumatic            Eyes- Gross vision  intact, PERRLA, conjunctivae clear secretions            Ears- Hearing,  mildly reduced, some scarring right TM            Nose- Clear, no- Septal dev, mucus, polyps, erosion, perforation             Throat- Mallampati II , mucosa clear , drainage- none, tonsils- atrophic. edentulous Neck- flexible , trachea midline, no stridor , thyroid nl, carotid no bruit Chest - symmetrical excursion , unlabored           Heart/CV- RRR w/ extra beats , no murmur , no gallop  , no  rub, nl s1 s2                           - JVD- none , edema- none, stasis changes- none, varices- none           Lung- clear to P&A, wheeze- none, cough-none , dullness-none, rub- none           Chest wall-  Abd-  Br/ Gen/ Rectal- Not done, not indicated Extrem- cyanosis- none, clubbing, none, atrophy- none, strength- nl Neuro- grossly intact to observation    Assessment & Plan:

## 2012-08-11 ENCOUNTER — Ambulatory Visit (INDEPENDENT_AMBULATORY_CARE_PROVIDER_SITE_OTHER): Payer: Medicare Other

## 2012-08-11 DIAGNOSIS — J309 Allergic rhinitis, unspecified: Secondary | ICD-10-CM | POA: Diagnosis not present

## 2012-08-13 DIAGNOSIS — J309 Allergic rhinitis, unspecified: Secondary | ICD-10-CM | POA: Diagnosis not present

## 2012-08-17 NOTE — Assessment & Plan Note (Signed)
He believes allergy vaccine continues to help him and that the current dose is sufficient. We discussed the upcoming spring pollen season and expectations as well as availability of OTC antihistamines.

## 2012-08-17 NOTE — Assessment & Plan Note (Signed)
Discussed potential effect of his timolol eyedrop. He is not recognizing a problem currently.

## 2012-08-20 DIAGNOSIS — J309 Allergic rhinitis, unspecified: Secondary | ICD-10-CM | POA: Diagnosis not present

## 2012-08-21 DIAGNOSIS — H35059 Retinal neovascularization, unspecified, unspecified eye: Secondary | ICD-10-CM | POA: Diagnosis not present

## 2012-08-21 DIAGNOSIS — H3530 Unspecified macular degeneration: Secondary | ICD-10-CM | POA: Diagnosis not present

## 2012-08-27 DIAGNOSIS — J309 Allergic rhinitis, unspecified: Secondary | ICD-10-CM | POA: Diagnosis not present

## 2012-09-03 DIAGNOSIS — J309 Allergic rhinitis, unspecified: Secondary | ICD-10-CM | POA: Diagnosis not present

## 2012-09-04 DIAGNOSIS — R42 Dizziness and giddiness: Secondary | ICD-10-CM | POA: Diagnosis not present

## 2012-09-04 DIAGNOSIS — J1520 Pneumonia due to staphylococcus, unspecified: Secondary | ICD-10-CM | POA: Diagnosis not present

## 2012-09-08 DIAGNOSIS — I2510 Atherosclerotic heart disease of native coronary artery without angina pectoris: Secondary | ICD-10-CM | POA: Diagnosis not present

## 2012-09-08 DIAGNOSIS — I119 Hypertensive heart disease without heart failure: Secondary | ICD-10-CM | POA: Diagnosis not present

## 2012-09-08 DIAGNOSIS — E782 Mixed hyperlipidemia: Secondary | ICD-10-CM | POA: Diagnosis not present

## 2012-09-08 DIAGNOSIS — E039 Hypothyroidism, unspecified: Secondary | ICD-10-CM | POA: Diagnosis not present

## 2012-09-10 DIAGNOSIS — J309 Allergic rhinitis, unspecified: Secondary | ICD-10-CM | POA: Diagnosis not present

## 2012-09-17 DIAGNOSIS — J309 Allergic rhinitis, unspecified: Secondary | ICD-10-CM | POA: Diagnosis not present

## 2012-09-24 DIAGNOSIS — J309 Allergic rhinitis, unspecified: Secondary | ICD-10-CM | POA: Diagnosis not present

## 2012-10-01 DIAGNOSIS — J309 Allergic rhinitis, unspecified: Secondary | ICD-10-CM | POA: Diagnosis not present

## 2012-10-05 ENCOUNTER — Telehealth: Payer: Self-pay | Admitting: *Deleted

## 2012-10-05 MED ORDER — LOSARTAN POTASSIUM 100 MG PO TABS: 100.0000 mg | ORAL_TABLET | Freq: Every day | ORAL | Status: DC

## 2012-10-05 MED ORDER — ISOSORBIDE MONONITRATE ER 60 MG PO TB24: 60.0000 mg | ORAL_TABLET | Freq: Every day | ORAL | Status: DC

## 2012-10-05 NOTE — Telephone Encounter (Signed)
Walk-In  Requested Rxs for Losartan 100mg  and Isosorbide 60mg  for mail order pharmacy, [facility].  Last OV notes (2) printed as pt is a [place] pt and reviewed.  Meds on list.  Pt also requested samples of Crestor 5mg  and (2) packs given.  Pt informed Rxs will be sent electronically to [facility] for him. Pt asked about [place] office closing and informed of reason.  Pt verbalized understanding and agreed w/ plan.

## 2012-10-08 DIAGNOSIS — J309 Allergic rhinitis, unspecified: Secondary | ICD-10-CM | POA: Diagnosis not present

## 2012-10-15 DIAGNOSIS — J309 Allergic rhinitis, unspecified: Secondary | ICD-10-CM | POA: Diagnosis not present

## 2012-10-16 ENCOUNTER — Other Ambulatory Visit: Payer: Self-pay | Admitting: *Deleted

## 2012-10-16 MED ORDER — POTASSIUM CHLORIDE ER 10 MEQ PO TBCR: 10.0000 meq | EXTENDED_RELEASE_TABLET | Freq: Every day | ORAL | Status: DC

## 2012-10-16 NOTE — Telephone Encounter (Signed)
Patient walk-in stated he need his potassium refills, send potassium to [facility]

## 2012-10-22 DIAGNOSIS — J309 Allergic rhinitis, unspecified: Secondary | ICD-10-CM | POA: Diagnosis not present

## 2012-10-29 DIAGNOSIS — H9209 Otalgia, unspecified ear: Secondary | ICD-10-CM | POA: Diagnosis not present

## 2012-10-29 DIAGNOSIS — J309 Allergic rhinitis, unspecified: Secondary | ICD-10-CM | POA: Diagnosis not present

## 2012-10-30 ENCOUNTER — Other Ambulatory Visit: Payer: Self-pay | Admitting: *Deleted

## 2012-10-30 MED ORDER — ROSUVASTATIN CALCIUM 5 MG PO TABS: 5.0000 mg | ORAL_TABLET | Freq: Every day | ORAL | Status: DC

## 2012-11-05 DIAGNOSIS — J309 Allergic rhinitis, unspecified: Secondary | ICD-10-CM | POA: Diagnosis not present

## 2012-11-12 DIAGNOSIS — J309 Allergic rhinitis, unspecified: Secondary | ICD-10-CM | POA: Diagnosis not present

## 2012-11-18 DIAGNOSIS — N400 Enlarged prostate without lower urinary tract symptoms: Secondary | ICD-10-CM | POA: Diagnosis not present

## 2012-11-19 DIAGNOSIS — J309 Allergic rhinitis, unspecified: Secondary | ICD-10-CM | POA: Diagnosis not present

## 2012-11-24 ENCOUNTER — Telehealth: Payer: Self-pay | Admitting: *Deleted

## 2012-11-24 MED ORDER — ROSUVASTATIN CALCIUM 5 MG PO TABS: 5.0000 mg | ORAL_TABLET | Freq: Every day | ORAL | Status: DC

## 2012-11-24 NOTE — Telephone Encounter (Signed)
**  Walk-In**  Message received that pt requesting samples of Crestor 5mg .    Samples given to pt.  Lot: [id number] Exp: 04/2015.  Pt advised to call at least a day in advance before he wants to pick up samples so they will be ready and he will not have to wait.  Pt verbalized understanding and agreed w/ plan.

## 2012-11-26 ENCOUNTER — Ambulatory Visit (INDEPENDENT_AMBULATORY_CARE_PROVIDER_SITE_OTHER): Payer: Medicare Other | Admitting: Otolaryngology

## 2012-11-26 DIAGNOSIS — R42 Dizziness and giddiness: Secondary | ICD-10-CM | POA: Diagnosis not present

## 2012-11-26 DIAGNOSIS — J309 Allergic rhinitis, unspecified: Secondary | ICD-10-CM | POA: Diagnosis not present

## 2012-11-26 DIAGNOSIS — H903 Sensorineural hearing loss, bilateral: Secondary | ICD-10-CM | POA: Diagnosis not present

## 2012-12-03 DIAGNOSIS — J309 Allergic rhinitis, unspecified: Secondary | ICD-10-CM | POA: Diagnosis not present

## 2012-12-10 ENCOUNTER — Telehealth: Payer: Self-pay | Admitting: Internal Medicine

## 2012-12-10 DIAGNOSIS — J309 Allergic rhinitis, unspecified: Secondary | ICD-10-CM | POA: Diagnosis not present

## 2012-12-10 MED ORDER — PANTOPRAZOLE SODIUM 40 MG PO TBEC: 40.0000 mg | DELAYED_RELEASE_TABLET | Freq: Every day | ORAL | Status: DC

## 2012-12-10 NOTE — Telephone Encounter (Signed)
Pt came by office to see if we would call in his Protonix 40 mg rx (90 day supply) to [facility] order. Any questions his number is [phone] and [facility]/s number is [phone]

## 2012-12-10 NOTE — Addendum Note (Signed)
Addended by: Nira Retort on: 12/10/2012 04:05 PM   Modules accepted: Orders

## 2012-12-10 NOTE — Telephone Encounter (Signed)
Routing to refill box  

## 2012-12-16 ENCOUNTER — Ambulatory Visit (INDEPENDENT_AMBULATORY_CARE_PROVIDER_SITE_OTHER): Payer: Medicare Other

## 2012-12-16 DIAGNOSIS — J309 Allergic rhinitis, unspecified: Secondary | ICD-10-CM | POA: Diagnosis not present

## 2012-12-17 DIAGNOSIS — J309 Allergic rhinitis, unspecified: Secondary | ICD-10-CM | POA: Diagnosis not present

## 2012-12-24 DIAGNOSIS — L570 Actinic keratosis: Secondary | ICD-10-CM | POA: Diagnosis not present

## 2012-12-24 DIAGNOSIS — L820 Inflamed seborrheic keratosis: Secondary | ICD-10-CM | POA: Diagnosis not present

## 2012-12-24 DIAGNOSIS — D044 Carcinoma in situ of skin of scalp and neck: Secondary | ICD-10-CM | POA: Diagnosis not present

## 2012-12-24 DIAGNOSIS — D235 Other benign neoplasm of skin of trunk: Secondary | ICD-10-CM | POA: Diagnosis not present

## 2012-12-24 DIAGNOSIS — J309 Allergic rhinitis, unspecified: Secondary | ICD-10-CM | POA: Diagnosis not present

## 2012-12-29 ENCOUNTER — Telehealth: Payer: Self-pay | Admitting: Cardiovascular Disease

## 2012-12-29 MED ORDER — ROSUVASTATIN CALCIUM 5 MG PO TABS: 5.0000 mg | ORAL_TABLET | Freq: Every day | ORAL | Status: DC

## 2012-12-29 NOTE — Telephone Encounter (Signed)
Would like some samples of Crestor 5 mg please. °

## 2012-12-29 NOTE — Telephone Encounter (Signed)
Returned call and wife informed samples left at front desk.  Verbalized understanding and agreed w/ plan.    

## 2012-12-31 DIAGNOSIS — J309 Allergic rhinitis, unspecified: Secondary | ICD-10-CM | POA: Diagnosis not present

## 2013-01-07 ENCOUNTER — Ambulatory Visit (INDEPENDENT_AMBULATORY_CARE_PROVIDER_SITE_OTHER): Payer: Medicare Other | Admitting: Otolaryngology

## 2013-01-07 DIAGNOSIS — R42 Dizziness and giddiness: Secondary | ICD-10-CM | POA: Diagnosis not present

## 2013-01-07 DIAGNOSIS — H608X9 Other otitis externa, unspecified ear: Secondary | ICD-10-CM

## 2013-01-07 DIAGNOSIS — J309 Allergic rhinitis, unspecified: Secondary | ICD-10-CM | POA: Diagnosis not present

## 2013-01-08 DIAGNOSIS — N411 Chronic prostatitis: Secondary | ICD-10-CM | POA: Diagnosis not present

## 2013-01-08 DIAGNOSIS — R350 Frequency of micturition: Secondary | ICD-10-CM | POA: Diagnosis not present

## 2013-01-13 DIAGNOSIS — H353190 Nonexudative age-related macular degeneration, unspecified eye, stage unspecified: Secondary | ICD-10-CM | POA: Diagnosis not present

## 2013-01-13 DIAGNOSIS — Z961 Presence of intraocular lens: Secondary | ICD-10-CM | POA: Diagnosis not present

## 2013-01-13 DIAGNOSIS — H43819 Vitreous degeneration, unspecified eye: Secondary | ICD-10-CM | POA: Diagnosis not present

## 2013-01-13 DIAGNOSIS — H40059 Ocular hypertension, unspecified eye: Secondary | ICD-10-CM | POA: Diagnosis not present

## 2013-01-13 DIAGNOSIS — H04129 Dry eye syndrome of unspecified lacrimal gland: Secondary | ICD-10-CM | POA: Diagnosis not present

## 2013-01-14 ENCOUNTER — Other Ambulatory Visit: Payer: Self-pay | Admitting: *Deleted

## 2013-01-14 MED ORDER — NITROGLYCERIN 0.4 MG SL SUBL: 0.4000 mg | SUBLINGUAL_TABLET | SUBLINGUAL | Status: DC | PRN

## 2013-01-15 DIAGNOSIS — E039 Hypothyroidism, unspecified: Secondary | ICD-10-CM | POA: Diagnosis not present

## 2013-01-20 DIAGNOSIS — Z85828 Personal history of other malignant neoplasm of skin: Secondary | ICD-10-CM | POA: Diagnosis not present

## 2013-01-20 DIAGNOSIS — L570 Actinic keratosis: Secondary | ICD-10-CM | POA: Diagnosis not present

## 2013-01-21 DIAGNOSIS — J309 Allergic rhinitis, unspecified: Secondary | ICD-10-CM | POA: Diagnosis not present

## 2013-01-25 ENCOUNTER — Telehealth: Payer: Self-pay | Admitting: Cardiovascular Disease

## 2013-01-25 NOTE — Telephone Encounter (Signed)
Returned call.  Informed no samples available.  Verbalized understanding.  Will check back later.

## 2013-01-25 NOTE — Telephone Encounter (Signed)
Would like some samples of Crestor 5 mg please. °

## 2013-01-28 DIAGNOSIS — J309 Allergic rhinitis, unspecified: Secondary | ICD-10-CM | POA: Diagnosis not present

## 2013-02-04 DIAGNOSIS — J309 Allergic rhinitis, unspecified: Secondary | ICD-10-CM | POA: Diagnosis not present

## 2013-02-09 ENCOUNTER — Telehealth: Payer: Self-pay | Admitting: Cardiovascular Disease

## 2013-02-09 ENCOUNTER — Ambulatory Visit: Payer: Medicare Other | Admitting: Internal Medicine

## 2013-02-09 NOTE — Telephone Encounter (Signed)
LMTCB

## 2013-02-09 NOTE — Telephone Encounter (Signed)
Would like some samples of Crestor 5mg  please.Would like to pick them up this afternoon.

## 2013-02-11 DIAGNOSIS — L570 Actinic keratosis: Secondary | ICD-10-CM | POA: Diagnosis not present

## 2013-02-11 DIAGNOSIS — L259 Unspecified contact dermatitis, unspecified cause: Secondary | ICD-10-CM | POA: Diagnosis not present

## 2013-02-11 DIAGNOSIS — J309 Allergic rhinitis, unspecified: Secondary | ICD-10-CM | POA: Diagnosis not present

## 2013-02-12 NOTE — Telephone Encounter (Signed)
Called patient to notify him we do not have any Crestor 5 mg samples and spoke with his wife, Ander Slade. She informed me that he was able to pick up Crestor 10 mg and will be cutting them in half.

## 2013-02-18 ENCOUNTER — Ambulatory Visit (INDEPENDENT_AMBULATORY_CARE_PROVIDER_SITE_OTHER): Payer: Medicare Other | Admitting: Internal Medicine

## 2013-02-18 ENCOUNTER — Encounter: Payer: Self-pay | Admitting: Internal Medicine

## 2013-02-18 ENCOUNTER — Ambulatory Visit (INDEPENDENT_AMBULATORY_CARE_PROVIDER_SITE_OTHER)
Admission: RE | Admit: 2013-02-18 | Discharge: 2013-02-18 | Disposition: A | Discharge status: Home / Self Care | Payer: Medicare Other | Source: Ambulatory Visit | Attending: Internal Medicine | Admitting: Internal Medicine

## 2013-02-18 VITALS — BP 130/76 | HR 65 | Ht 69.0 in | Wt 138.0 lb

## 2013-02-18 DIAGNOSIS — J302 Other seasonal allergic rhinitis: Secondary | ICD-10-CM

## 2013-02-18 DIAGNOSIS — J309 Allergic rhinitis, unspecified: Secondary | ICD-10-CM | POA: Diagnosis not present

## 2013-02-18 DIAGNOSIS — Z23 Encounter for immunization: Secondary | ICD-10-CM

## 2013-02-18 DIAGNOSIS — D869 Sarcoidosis, unspecified: Secondary | ICD-10-CM | POA: Diagnosis not present

## 2013-02-18 MED ORDER — PHENYLEPHRINE HCL 1 % NA SOLN: 3.0000 [drp] | Freq: Once | NASAL | Status: DC

## 2013-02-18 MED ORDER — METHYLPREDNISOLONE ACETATE 80 MG/ML IJ SUSP
80.0000 mg | Freq: Once | INTRAMUSCULAR | Status: AC
  Administered 2013-02-18: 80 mg via INTRAMUSCULAR

## 2013-02-18 NOTE — Patient Instructions (Signed)
Hi dose flu vax  Depo 80  Neb neo nasal  Order- CXR- dx Sarcoid

## 2013-02-18 NOTE — Progress Notes (Signed)
Subjective:    Patient ID: Dale [name], male    DOB: 07/02/1928, 79 y.o.   MRN: [id number]  HPI 12/27/10-79 year old male never smoker followed for Allergic rhinitis, asthma,  hx sarcoid, glaucoma, history ITP Last here Sep 21, 2009-note reviewed. Allergy vaccine - continues long term. We discussed option to stop or retest.  CO throat tickle at night. Eating or drink, or melting ice in mouth will break it loose. Takes daily mucinex. Feels travatan eye drop get into nose and cause some swelling  02/10/12- 79 year old male never smoker followed for Allergic rhinitis, asthma,  hx sarcoid, complicated by glaucoma, history ITP Still on allergy vaccine 1:50 G0 and doing well with it; C/O yellow thick phelgm in throat Wants to wait on flu vaccine. In May had right hydropneumothorax on CT scan. Last week was in [place] for 2 days with left anterior chest pain/heart cath-"not heart". Some throat clearing of yellow phlegm but otherwise feels well. Had been having reflux. Dr Regino Schultze treated Z-Pak, ending today. CT chest 10/01/11-reviewed with him IMPRESSION:  1. No evidence of pulmonary embolism. Mildly motion degraded  exam.  2. Development of a right-sided hydropneumothorax, small volume.  Critical test results telephoned to [facility], rn. at the time of  interpretation at 12:38 p.m. on 10/01/2011.  3. Findings of sarcoidosis, similar to on the prior exam. No  evidence of superimposed pneumonia or airspace disease.  4. Pulmonary artery enlargement suggests pulmonary arterial  hypertension.  Original Report Authenticated By: Consuello Bossier, M.D.  CXR 02/02/12-reviewed with him IMPRESSION:  Stable parenchymal densities in the right lung and right hilar  region. Findings are most likely secondary to sarcoidosis.  No evidence for pulmonary edema.  Original Report Authenticated By: Richarda Overlie, M.D.   08/10/12- 79 year old male never smoker followed for Allergic rhinitis, asthma,  hx sarcoid,  complicated by glaucoma, history ITP FOLLOWS FOR: still on allergy vaccine 1:50 GO and denies any flare ups at this time. Doxycycline at last office visit cleared his bronchitis. He is comfortable so far in the early spring. CXR 02/12/12 IMPRESSION:  Stable exam. No new or progressive findings.  Original Report Authenticated By: ERIC A. MANSELL, M.D.  02/18/13- 79 year old male never smoker followed for Allergic rhinitis, asthma,  hx sarcoid, Hx R hydropneumothorax 2013- torn adhesion, complicated by glaucoma, history ITP ACUTE VISIT: nasal congestion and burning("more less water/drainage"). allergy vaccine 1:50 GO Still feels this is ok and helpful Not a cold  Review of Systems- see HPI Constitutional:   No-   weight loss, night sweats, fevers, chills, fatigue, lassitude. HEENT:   No-  headaches, difficulty swallowing, tooth/dental problems, sore throat,       No-  sneezing, itching, ear ache, + nasal congestion, +post nasal drip,  CV:  No-   chest pain, orthopnea, PND, swelling in lower extremities, anasarca, dizziness, palpitations Resp: No-acute shortness of breath with exertion or at rest.             No- productive cough,  No non-productive cough,  No-  coughing up of blood.              No-   change in color of mucus.  No- wheezing.   Skin: No-   rash or lesions. GI:  No-   heartburn, indigestion, abdominal pain, nausea, vomiting,  GU:  MS:  No-   joint pain or swelling.   Neuro- nothing unusual  Psych:  No- change in mood or affect. No depression or anxiety.  No  memory loss.    Objective:   Physical Exam General- Alert, Oriented, Affect-appropriate, Distress- none acute   comfortable appearing elderly man Skin- rash-none, lesions- none, excoriation- none Lymphadenopathy- none Head- atraumatic            Eyes- Gross vision intact, PERRLA, conjunctivae clear secretions            Ears- Hearing,  mildly reduced, some scarring right TM            Nose- Clear, no- Septal dev, +  watery rhinorhea, polyps, erosion, perforation             Throat- Mallampati II , mucosa clear , drainage- none, tonsils- atrophic. edentulous Neck- flexible , trachea midline, no stridor , thyroid nl, carotid no bruit Chest - symmetrical excursion , unlabored           Heart/CV- RRR w/ extra beats , no murmur , no gallop  , no rub, nl s1 s2                           - JVD- none , edema- none, stasis changes- none, varices- none           Lung- clear to P&A, wheeze- none, cough-none , dullness-none, rub- none           Chest wall-  Abd-  Br/ Gen/ Rectal- Not done, not indicated Extrem- cyanosis- none, clubbing, none, atrophy- none, strength- nl Neuro- grossly intact to observation    Assessment & Plan:

## 2013-02-19 ENCOUNTER — Telehealth: Payer: Self-pay | Admitting: *Deleted

## 2013-02-19 DIAGNOSIS — J309 Allergic rhinitis, unspecified: Secondary | ICD-10-CM | POA: Diagnosis not present

## 2013-02-19 MED ORDER — PREDNISONE 10 MG PO TABS: 10.0000 mg | ORAL_TABLET | Freq: Every day | ORAL | Status: DC

## 2013-02-19 NOTE — Telephone Encounter (Signed)
Try prednisone 10 mg, # 7, 1 daily x 7 days

## 2013-02-19 NOTE — Telephone Encounter (Signed)
Call pt with CXR results and he is requesting name of a medication he can take OTC. For his congestion other than mucinex. Please Advise CY

## 2013-02-19 NOTE — Progress Notes (Signed)
Quick Note:  Advised pt of CXR results per CY. Pt Verbalized understanding ______

## 2013-02-19 NOTE — Telephone Encounter (Signed)
lmtcb

## 2013-02-25 DIAGNOSIS — J309 Allergic rhinitis, unspecified: Secondary | ICD-10-CM | POA: Diagnosis not present

## 2013-03-04 DIAGNOSIS — J309 Allergic rhinitis, unspecified: Secondary | ICD-10-CM | POA: Diagnosis not present

## 2013-03-06 NOTE — Assessment & Plan Note (Addendum)
Current complaint sounds like a vasomotor rhinitis Plan nasal neb, depomedrol, consider trial of ipratropium

## 2013-03-06 NOTE — Assessment & Plan Note (Signed)
Plan- update CXR, flu vax

## 2013-03-08 ENCOUNTER — Other Ambulatory Visit: Payer: Self-pay | Admitting: *Deleted

## 2013-03-08 MED ORDER — CLOPIDOGREL BISULFATE 75 MG PO TABS: 75.0000 mg | ORAL_TABLET | Freq: Every day | ORAL | Status: DC

## 2013-03-11 DIAGNOSIS — J309 Allergic rhinitis, unspecified: Secondary | ICD-10-CM | POA: Diagnosis not present

## 2013-03-18 DIAGNOSIS — J309 Allergic rhinitis, unspecified: Secondary | ICD-10-CM | POA: Diagnosis not present

## 2013-03-25 DIAGNOSIS — J309 Allergic rhinitis, unspecified: Secondary | ICD-10-CM | POA: Diagnosis not present

## 2013-03-31 ENCOUNTER — Telehealth: Payer: Self-pay | Admitting: *Deleted

## 2013-03-31 DIAGNOSIS — J309 Allergic rhinitis, unspecified: Secondary | ICD-10-CM | POA: Diagnosis not present

## 2013-03-31 NOTE — Telephone Encounter (Signed)
Sent to [place] to advise.

## 2013-03-31 NOTE — Telephone Encounter (Signed)
Pt wants to know that if he can have samples of Crestor when he comes in for his appointment on Monday

## 2013-04-05 ENCOUNTER — Ambulatory Visit (INDEPENDENT_AMBULATORY_CARE_PROVIDER_SITE_OTHER): Payer: Medicare Other | Admitting: Cardiovascular Disease

## 2013-04-05 ENCOUNTER — Encounter: Payer: Self-pay | Admitting: Cardiovascular Disease

## 2013-04-05 VITALS — BP 126/70 | HR 54 | Ht 68.0 in | Wt 136.6 lb

## 2013-04-05 DIAGNOSIS — K219 Gastro-esophageal reflux disease without esophagitis: Secondary | ICD-10-CM | POA: Diagnosis not present

## 2013-04-05 DIAGNOSIS — I10 Essential (primary) hypertension: Secondary | ICD-10-CM

## 2013-04-05 DIAGNOSIS — I2510 Atherosclerotic heart disease of native coronary artery without angina pectoris: Secondary | ICD-10-CM

## 2013-04-05 DIAGNOSIS — E7800 Pure hypercholesterolemia, unspecified: Secondary | ICD-10-CM | POA: Diagnosis not present

## 2013-04-05 DIAGNOSIS — E039 Hypothyroidism, unspecified: Secondary | ICD-10-CM

## 2013-04-05 NOTE — Patient Instructions (Signed)
Your physician recommends that you schedule a follow-up appointment in: 6 months  

## 2013-04-05 NOTE — Progress Notes (Signed)
Patient ID: Dale [name], male   DOB: Mar 19, 1929, 79 y.o.   MRN: [id number]     HPI: Dale [name] is a 79 y.o. male who presents to the office for a six-month cardiology evaluation.  Dale [name] has known coronary artery disease and in 2000 underwent initial intervention to his right coronary artery. In 2009 a Cypher stent was placed beyond the acute margin of his right eye artery. His last cardiac catheterization was in September 2013 by Dr. Allyson Sabal which showed 30% LAD narrowing after the second diagonal vessel, and normal left circumflex coronary artery, and pain right coronary artery stents.  Has a history of hypothyroidism on Synthroid replacement, hyperlipidemia on Crestor 5 mg plus Zetia 10 mg, GERD, on Protonix, and continues to be on dual antiplatelet therapy. He is responsive to Plavix on previous P2 Y12 testing.  He does note a rare episode of palpitation. Recently he was taking Mucinex D and did notice an episode of palpitations this lasted approximately 5 hours. This ultimately resolved. He did take an extra half of Toprol that day with improvement. He denies recurrent chest pain. He denies significant shortness of breath. He denies presyncope or syncope.  Past Medical History  Diagnosis Date  . Coronary heart disease     s/p stenting. cath in 01/2012 noncritical occlusion  . Idiopathic thrombocytopenic purpura 2002  . Allergic rhinitis   . Sarcoidosis     pulmonary  . Hyperlipidemia   . Hypertension   . Macular degeneration   . Hypothyroidism   . Chest pain   . GERD (gastroesophageal reflux disease)   . CHF (congestive heart failure)   . Cancer     skin  . Asthma   . Glaucoma   . Nephrolithiasis   . PUD (peptic ulcer disease)     remote  . Schatzki's ring   . Hiatal hernia   . Anal fissure     Past Surgical History  Procedure Laterality Date  . Mediastinoscopy      for dx sarcoid  . Coronary angioplasty with stent placement    . Cardiac stents    .  Esophagogastroduodenoscopy  06/19/2004    Two esophageal rings and esophageal web as described above.  All of these were disrupted by passing 56-French Elease Hashimoto dilator/ Candida esophagitis,which appears to be incidental given history of   antibiotic use, but nevertheless will be treated.  . Esophagogastroduodenoscopy  10/30/2006    Distal tandem esophageal ring status post dilation disruption as  described above.  Otherwise normal esophagus/  Small hiatal hernia otherwise normal stomach, D1 and D2  . Colonoscopy  10/30/2006    Normal rectum, sigmoid diverticula.Remainder of colonic mucosa appeared normal.  . Esophagogastroduodenoscopy (egd) with esophageal dilation  03/04/2012    RMR- schatzki's ring, hiatal hernia, polypoid gastric mucosa, bx= minimally active gastritis.    Allergies  Allergen Reactions  . Doxazosin Shortness Of Breath  . Hydrocodone Nausea And Vomiting  . Morphine     "made me crazy"  . Penicillins Nausea And Vomiting  . Sulfonamide Derivatives Nausea And Vomiting    Current Outpatient Prescriptions  Medication Sig Dispense Refill  . aspirin 81 MG tablet Take 81 mg by mouth daily.        . clopidogrel (PLAVIX) 75 MG tablet Take 1 tablet (75 mg total) by mouth daily.  90 tablet  3  . dextromethorphan-guaiFENesin (MUCINEX DM) 30-600 MG per 12 hr tablet Take 1 tablet by mouth daily as needed.       Marland Kitchen  dorzolamide (TRUSOPT) 2 % ophthalmic solution Place 1 drop into both eyes 2 (two) times daily.       Marland Kitchen ezetimibe (ZETIA) 10 MG tablet Take 10 mg by mouth daily.        . finasteride (PROSCAR) 5 MG tablet Take 5 mg by mouth daily.        . hydrochlorothiazide (MICROZIDE) 12.5 MG capsule Take 12.5 mg by mouth daily as needed.       . isosorbide mononitrate (IMDUR) 60 MG 24 hr tablet Take 1 tablet (60 mg total) by mouth daily.  90 tablet  3  . latanoprost (XALATAN) 0.005 % ophthalmic solution Place 1 drop into both eyes at bedtime.      Marland Kitchen levothyroxine (SYNTHROID, LEVOTHROID)  100 MCG tablet Take 100 mcg by mouth daily.      Marland Kitchen losartan (COZAAR) 100 MG tablet Take 1 tablet (100 mg total) by mouth daily.  90 tablet  3  . metoprolol succinate (TOPROL-XL) 25 MG 24 hr tablet Take 12.5 mg by mouth 2 (two) times daily.       . Multiple Vitamin (MULTIVITAMIN) tablet Take 1 tablet by mouth daily.        . Multiple Vitamins-Minerals (PRESERVISION/LUTEIN) CAPS Take 2 capsules by mouth daily.       . nitroGLYCERIN (NITROSTAT) 0.4 MG SL tablet Place 1 tablet (0.4 mg total) under the tongue every 5 (five) minutes as needed. For chest pain  25 tablet  2  . pantoprazole (PROTONIX) 40 MG tablet Take 1 tablet (40 mg total) by mouth daily.  90 tablet  3  . potassium chloride (K-DUR) 10 MEQ tablet Take 1 tablet (10 mEq total) by mouth daily.  90 tablet  3  . PRESCRIPTION MEDICATION Inject 1 each as directed once a week. Allergy shot= thursday      . rosuvastatin (CRESTOR) 5 MG tablet Take 1 tablet (5 mg total) by mouth daily.  28 tablet  0  . timolol (TIMOPTIC) 0.5 % ophthalmic solution Place 1 drop into both eyes 2 (two) times daily.       Marland Kitchen triamcinolone (KENALOG) 0.1 % cream Apply topically 2 (two) times daily as needed.        . vitamin C (ASCORBIC ACID) 500 MG tablet Take 500 mg by mouth daily.        . vitamin E 200 UNIT capsule Take 200 Units by mouth daily.         No current facility-administered medications for this visit.    History   Social History  . Marital Status: Married    Spouse Name: N/A    Number of Children: 1  . Years of Education: N/A   Occupational History  . 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     Social History Main Topics  . Smoking status: Former Smoker -- 0.10 packs/day for 2 years    Types: Cigarettes, Cigars    Quit date: 05/06/1970  . Smokeless tobacco: Never Used  . Alcohol Use: No  . Drug Use: No  . Sexual Activity: No   Other Topics Concern  . Not on file   Social History Narrative  . No narrative on file   Additional  social history is notable that he is married and lives with his wife. He quit smoking over 40 years ago. Has one child. He remains active.  Family History  Problem Relation Age of Onset  . Heart disease Father  deceased age 6  . Stroke Mother   . Alzheimer's disease Mother   . Heart attack Brother     deceased age 76  . Colon cancer Neg Hx   . Cancer Other     niece    ROS is negative for fevers, chills or night sweats. He denies bruisability. Denies rash. He does wear glasses. He denies visual changes. Denies difficulty hearing. He is unaware of lymphadenopathy. He denies increased cough or sputum production. He recently was taking some Mucinex D for congestion. He did no palpitations while taking this. He denies anginal-type symptoms. He does have occasional reflux. He denies blood in the stool or urine. Denies nausea or vomiting. He denies claudication. He denies edema. He denies paresthesias. He does have hypothyroidism on Synthroid replacement. There is no diabetes.  Other comprehensive 12 point system review is negative.  PE BP 126/70  Pulse 54  Ht 5\' 8"  (1.727 m)  Wt 136 lb 9.6 oz (61.961 kg)  BMI 20.77 kg/m2  General: Alert, oriented, no distress.  Skin: normal turgor, no rashes HEENT: Normocephalic, atraumatic. Pupils round and reactive; sclera anicteric;no lid lag.  Nose without nasal septal hypertrophy Mouth/Parynx benign; Mallinpatti scale 2 Neck: No JVD, no carotid briuts Lungs: clear to ausculatation and percussion; no wheezing or rales Heart: RRR, s1 s2 normal 1/6 systolic murmur, unchanged. No S3 gallop no rub Abdomen: soft, nontender; no hepatosplenomehaly, BS+; abdominal aorta nontender and not dilated by palpation. Pulses 2+ Extremities: no clubbing cyanosis or edema, Homan's sign negative  Neurologic: grossly nonfocal Psychologic: normal affect and mood.  ECG: Sinus bradycardia 54 beats per minute. PR interval 206 ms. QTc interval normal  LABS:  BMET     Component Value Date/Time   NA 134* 02/03/2012 0430   K 3.5 02/03/2012 0430   CL 101 02/03/2012 0430   CO2 27 02/03/2012 0430   GLUCOSE 125* 02/03/2012 0430   BUN 16 02/03/2012 0430   CREATININE 0.83 02/03/2012 0430   CALCIUM 8.6 02/03/2012 0430   GFRNONAA 80* 02/03/2012 0430   GFRAA >90 02/03/2012 0430     Hepatic Function Panel     Component Value Date/Time   PROT 6.3 09/05/2009 1730   ALBUMIN 3.6 09/05/2009 1730   AST 29 09/05/2009 1730   ALT 29 09/05/2009 1730   ALKPHOS 71 09/05/2009 1730   BILITOT 0.9 09/05/2009 1730   BILIDIR 0.1 11/17/2007 1400   IBILI 0.5 11/17/2007 1400     CBC    Component Value Date/Time   WBC 10.5 02/03/2012 0430   RBC 3.43* 02/03/2012 0430   HGB 11.0* 02/03/2012 0430   HCT 33.4* 02/03/2012 0430   PLT 158 02/03/2012 0430   MCV 97.4 02/03/2012 0430   MCH 32.1 02/03/2012 0430   MCHC 32.9 02/03/2012 0430   RDW 13.9 02/03/2012 0430   LYMPHSABS 1.0 02/02/2012 0346   MONOABS 1.4* 02/02/2012 0346   EOSABS 0.4 02/02/2012 0346   BASOSABS 0.0 02/02/2012 0346     BNP    Component Value Date/Time   PROBNP 125.0* 11/14/2007 0405    Lipid Panel     Component Value Date/Time   CHOL 107 08/15/2011 0555   TRIG 74 08/15/2011 0555   HDL 41 08/15/2011 0555   CHOLHDL 2.6 08/15/2011 0555   VLDL 15 08/15/2011 0555   LDLCALC 51 08/15/2011 0555     RADIOLOGY: No results found.    ASSESSMENT AND PLAN: Dale [name] is now 79 years old and is 14 years status  post initial intervention to his right artery. He required subsequent intervention in 2009. At last catheterization in 2013 his RCA stents were patent he had mild 30% LAD narrowing. He's not having anginal symptoms. His blood pressure is well controlled. I suspect his recent episode of palpitations may have been exacerbated by taking Mucinex D which contains pseudoephedrine. Stressed avoidance of any deep preparations. We also discussed taking an extra 1/2-1 Toprol as needed. His blood pressure is controlled. He sees Dr.  Regino Schultze for primary care. I will see him in 6 months for cardiology reassessment or sooner if necessary.     Lennette Bihari, MD, [facility]  04/05/2013 12:27 PM

## 2013-04-06 ENCOUNTER — Encounter: Payer: Self-pay | Admitting: Cardiovascular Disease

## 2013-04-08 DIAGNOSIS — J309 Allergic rhinitis, unspecified: Secondary | ICD-10-CM | POA: Diagnosis not present

## 2013-04-14 DIAGNOSIS — H353190 Nonexudative age-related macular degeneration, unspecified eye, stage unspecified: Secondary | ICD-10-CM | POA: Diagnosis not present

## 2013-04-14 DIAGNOSIS — H40059 Ocular hypertension, unspecified eye: Secondary | ICD-10-CM | POA: Diagnosis not present

## 2013-04-14 DIAGNOSIS — H04129 Dry eye syndrome of unspecified lacrimal gland: Secondary | ICD-10-CM | POA: Diagnosis not present

## 2013-04-14 DIAGNOSIS — Z961 Presence of intraocular lens: Secondary | ICD-10-CM | POA: Diagnosis not present

## 2013-04-16 ENCOUNTER — Other Ambulatory Visit (HOSPITAL_COMMUNITY): Payer: Self-pay | Admitting: Physician Assistant

## 2013-04-16 DIAGNOSIS — R51 Headache: Secondary | ICD-10-CM | POA: Diagnosis not present

## 2013-04-16 DIAGNOSIS — J309 Allergic rhinitis, unspecified: Secondary | ICD-10-CM | POA: Diagnosis not present

## 2013-04-16 DIAGNOSIS — R3919 Other difficulties with micturition: Secondary | ICD-10-CM | POA: Diagnosis not present

## 2013-04-16 DIAGNOSIS — IMO0002 Reserved for concepts with insufficient information to code with codable children: Secondary | ICD-10-CM | POA: Diagnosis not present

## 2013-04-19 ENCOUNTER — Other Ambulatory Visit (HOSPITAL_COMMUNITY): Payer: Self-pay | Admitting: Physician Assistant

## 2013-04-20 ENCOUNTER — Other Ambulatory Visit: Payer: Self-pay | Admitting: Cardiovascular Disease

## 2013-04-20 ENCOUNTER — Ambulatory Visit (INDEPENDENT_AMBULATORY_CARE_PROVIDER_SITE_OTHER): Payer: Medicare Other

## 2013-04-20 ENCOUNTER — Ambulatory Visit (HOSPITAL_COMMUNITY)
Admission: RE | Admit: 2013-04-20 | Discharge: 2013-04-20 | Disposition: A | Discharge status: Home / Self Care | Payer: Medicare Other | Source: Ambulatory Visit | Attending: Physician Assistant | Admitting: Physician Assistant

## 2013-04-20 DIAGNOSIS — J309 Allergic rhinitis, unspecified: Secondary | ICD-10-CM

## 2013-04-20 DIAGNOSIS — I6529 Occlusion and stenosis of unspecified carotid artery: Secondary | ICD-10-CM | POA: Insufficient documentation

## 2013-04-20 DIAGNOSIS — R51 Headache: Secondary | ICD-10-CM | POA: Diagnosis not present

## 2013-04-20 DIAGNOSIS — I658 Occlusion and stenosis of other precerebral arteries: Secondary | ICD-10-CM | POA: Diagnosis not present

## 2013-04-21 DIAGNOSIS — H40059 Ocular hypertension, unspecified eye: Secondary | ICD-10-CM | POA: Diagnosis not present

## 2013-04-21 NOTE — Telephone Encounter (Signed)
Rx was sent to pharmacy electronically. 

## 2013-04-22 DIAGNOSIS — J309 Allergic rhinitis, unspecified: Secondary | ICD-10-CM | POA: Diagnosis not present

## 2013-04-22 DIAGNOSIS — E039 Hypothyroidism, unspecified: Secondary | ICD-10-CM | POA: Diagnosis not present

## 2013-04-27 DIAGNOSIS — H40059 Ocular hypertension, unspecified eye: Secondary | ICD-10-CM | POA: Diagnosis not present

## 2013-04-28 ENCOUNTER — Other Ambulatory Visit: Payer: Self-pay | Admitting: Cardiovascular Disease

## 2013-04-30 DIAGNOSIS — J309 Allergic rhinitis, unspecified: Secondary | ICD-10-CM | POA: Diagnosis not present

## 2013-05-03 ENCOUNTER — Other Ambulatory Visit: Payer: Self-pay | Admitting: *Deleted

## 2013-05-03 MED ORDER — EZETIMIBE 10 MG PO TABS: 10.0000 mg | ORAL_TABLET | Freq: Every day | ORAL | Status: DC

## 2013-05-07 DIAGNOSIS — J309 Allergic rhinitis, unspecified: Secondary | ICD-10-CM | POA: Diagnosis not present

## 2013-05-11 ENCOUNTER — Encounter (HOSPITAL_COMMUNITY): Payer: Self-pay | Admitting: Emergency Medicine

## 2013-05-11 ENCOUNTER — Emergency Department (HOSPITAL_COMMUNITY)
Admission: EM | Admit: 2013-05-11 | Discharge: 2013-05-11 | Disposition: A | Discharge status: Home / Self Care | Payer: Medicare Other | Attending: Emergency Medicine | Admitting: Emergency Medicine

## 2013-05-11 ENCOUNTER — Emergency Department (HOSPITAL_COMMUNITY): Payer: Medicare Other

## 2013-05-11 DIAGNOSIS — E785 Hyperlipidemia, unspecified: Secondary | ICD-10-CM | POA: Diagnosis not present

## 2013-05-11 DIAGNOSIS — I10 Essential (primary) hypertension: Secondary | ICD-10-CM | POA: Diagnosis not present

## 2013-05-11 DIAGNOSIS — Z87891 Personal history of nicotine dependence: Secondary | ICD-10-CM | POA: Insufficient documentation

## 2013-05-11 DIAGNOSIS — Z85828 Personal history of other malignant neoplasm of skin: Secondary | ICD-10-CM | POA: Diagnosis not present

## 2013-05-11 DIAGNOSIS — R6889 Other general symptoms and signs: Secondary | ICD-10-CM | POA: Diagnosis not present

## 2013-05-11 DIAGNOSIS — J45909 Unspecified asthma, uncomplicated: Secondary | ICD-10-CM | POA: Insufficient documentation

## 2013-05-11 DIAGNOSIS — R5381 Other malaise: Secondary | ICD-10-CM | POA: Insufficient documentation

## 2013-05-11 DIAGNOSIS — I2510 Atherosclerotic heart disease of native coronary artery without angina pectoris: Secondary | ICD-10-CM | POA: Insufficient documentation

## 2013-05-11 DIAGNOSIS — R112 Nausea with vomiting, unspecified: Secondary | ICD-10-CM | POA: Diagnosis not present

## 2013-05-11 DIAGNOSIS — R000 Tachycardia, unspecified: Secondary | ICD-10-CM | POA: Diagnosis not present

## 2013-05-11 DIAGNOSIS — J111 Influenza due to unidentified influenza virus with other respiratory manifestations: Secondary | ICD-10-CM | POA: Insufficient documentation

## 2013-05-11 DIAGNOSIS — R5383 Other fatigue: Secondary | ICD-10-CM | POA: Insufficient documentation

## 2013-05-11 DIAGNOSIS — K219 Gastro-esophageal reflux disease without esophagitis: Secondary | ICD-10-CM | POA: Insufficient documentation

## 2013-05-11 DIAGNOSIS — E860 Dehydration: Secondary | ICD-10-CM | POA: Diagnosis not present

## 2013-05-11 DIAGNOSIS — J029 Acute pharyngitis, unspecified: Secondary | ICD-10-CM | POA: Insufficient documentation

## 2013-05-11 DIAGNOSIS — Z8619 Personal history of other infectious and parasitic diseases: Secondary | ICD-10-CM | POA: Diagnosis not present

## 2013-05-11 DIAGNOSIS — E039 Hypothyroidism, unspecified: Secondary | ICD-10-CM | POA: Diagnosis not present

## 2013-05-11 DIAGNOSIS — R509 Fever, unspecified: Secondary | ICD-10-CM | POA: Diagnosis not present

## 2013-05-11 DIAGNOSIS — Z880 Allergy status to penicillin: Secondary | ICD-10-CM | POA: Insufficient documentation

## 2013-05-11 DIAGNOSIS — R109 Unspecified abdominal pain: Secondary | ICD-10-CM | POA: Insufficient documentation

## 2013-05-11 DIAGNOSIS — Z862 Personal history of diseases of the blood and blood-forming organs and certain disorders involving the immune mechanism: Secondary | ICD-10-CM | POA: Diagnosis not present

## 2013-05-11 DIAGNOSIS — Z9861 Coronary angioplasty status: Secondary | ICD-10-CM | POA: Insufficient documentation

## 2013-05-11 DIAGNOSIS — I509 Heart failure, unspecified: Secondary | ICD-10-CM | POA: Insufficient documentation

## 2013-05-11 DIAGNOSIS — Z87442 Personal history of urinary calculi: Secondary | ICD-10-CM | POA: Insufficient documentation

## 2013-05-11 DIAGNOSIS — Z8669 Personal history of other diseases of the nervous system and sense organs: Secondary | ICD-10-CM | POA: Insufficient documentation

## 2013-05-11 DIAGNOSIS — Z79899 Other long term (current) drug therapy: Secondary | ICD-10-CM | POA: Insufficient documentation

## 2013-05-11 DIAGNOSIS — Z7902 Long term (current) use of antithrombotics/antiplatelets: Secondary | ICD-10-CM | POA: Diagnosis not present

## 2013-05-11 DIAGNOSIS — Z8711 Personal history of peptic ulcer disease: Secondary | ICD-10-CM | POA: Insufficient documentation

## 2013-05-11 DIAGNOSIS — D869 Sarcoidosis, unspecified: Secondary | ICD-10-CM | POA: Diagnosis not present

## 2013-05-11 DIAGNOSIS — R011 Cardiac murmur, unspecified: Secondary | ICD-10-CM | POA: Insufficient documentation

## 2013-05-11 DIAGNOSIS — Z7982 Long term (current) use of aspirin: Secondary | ICD-10-CM | POA: Insufficient documentation

## 2013-05-11 LAB — CBC WITH DIFFERENTIAL/PLATELET
BASOS PCT: 0 % (ref 0–1)
Basophils Absolute: 0 10*3/uL (ref 0.0–0.1)
Eosinophils Absolute: 0.1 10*3/uL (ref 0.0–0.7)
Eosinophils Relative: 2 % (ref 0–5)
HEMATOCRIT: 38.2 % — AB (ref 39.0–52.0)
HEMOGLOBIN: 12.8 g/dL — AB (ref 13.0–17.0)
LYMPHS PCT: 7 % — AB (ref 12–46)
Lymphs Abs: 0.5 10*3/uL — ABNORMAL LOW (ref 0.7–4.0)
MCH: 34 pg (ref 26.0–34.0)
MCHC: 33.5 g/dL (ref 30.0–36.0)
MCV: 101.6 fL — ABNORMAL HIGH (ref 78.0–100.0)
Monocytes Absolute: 1.5 10*3/uL — ABNORMAL HIGH (ref 0.1–1.0)
Monocytes Relative: 20 % — ABNORMAL HIGH (ref 3–12)
NEUTROS ABS: 5.2 10*3/uL (ref 1.7–7.7)
Neutrophils Relative %: 71 % (ref 43–77)
Platelets: 157 10*3/uL (ref 150–400)
RBC: 3.76 MIL/uL — ABNORMAL LOW (ref 4.22–5.81)
RDW: 13.9 % (ref 11.5–15.5)
WBC Morphology: INCREASED
WBC: 7.3 10*3/uL (ref 4.0–10.5)

## 2013-05-11 LAB — COMPREHENSIVE METABOLIC PANEL
ALT: 26 U/L (ref 0–53)
AST: 19 U/L (ref 0–37)
Albumin: 3.6 g/dL (ref 3.5–5.2)
Alkaline Phosphatase: 74 U/L (ref 39–117)
BILIRUBIN TOTAL: 0.4 mg/dL (ref 0.3–1.2)
BUN: 14 mg/dL (ref 6–23)
CHLORIDE: 99 meq/L (ref 96–112)
CO2: 27 meq/L (ref 19–32)
Calcium: 9.2 mg/dL (ref 8.4–10.5)
Creatinine, Ser: 0.76 mg/dL (ref 0.50–1.35)
GFR, EST NON AFRICAN AMERICAN: 83 mL/min — AB (ref >90)
GLUCOSE: 127 mg/dL — AB (ref 70–99)
POTASSIUM: 3.9 meq/L (ref 3.7–5.3)
SODIUM: 136 meq/L — AB (ref 137–147)
Total Protein: 6.7 g/dL (ref 6.0–8.3)

## 2013-05-11 LAB — LACTIC ACID, PLASMA: Lactic Acid, Venous: 1.9 mmol/L (ref 0.5–2.2)

## 2013-05-11 MED ORDER — AZITHROMYCIN 250 MG PO TABS: ORAL_TABLET | ORAL | Status: DC

## 2013-05-11 MED ORDER — ONDANSETRON HCL 4 MG/2ML IJ SOLN
4.0000 mg | Freq: Once | INTRAMUSCULAR | Status: AC
Start: 2013-05-11 — End: 2013-05-11
  Administered 2013-05-11: 4 mg via INTRAVENOUS
  Filled 2013-05-11: qty 2

## 2013-05-11 MED ORDER — SODIUM CHLORIDE 0.9 % IV BOLUS (SEPSIS)
1000.0000 mL | Freq: Once | INTRAVENOUS | Status: AC
  Administered 2013-05-11: 1000 mL via INTRAVENOUS

## 2013-05-11 MED ORDER — ACETAMINOPHEN 500 MG PO TABS
1000.0000 mg | ORAL_TABLET | Freq: Once | ORAL | Status: AC
  Administered 2013-05-11: 1000 mg via ORAL
  Filled 2013-05-11: qty 2

## 2013-05-11 MED ORDER — ONDANSETRON 4 MG PO TBDP: ORAL_TABLET | ORAL | Status: DC

## 2013-05-11 MED ORDER — IBUPROFEN 400 MG PO TABS
400.0000 mg | ORAL_TABLET | Freq: Once | ORAL | Status: AC
  Administered 2013-05-11: 400 mg via ORAL
  Filled 2013-05-11: qty 1

## 2013-05-11 NOTE — Discharge Instructions (Signed)
Take tylenol (if you tolerate) and low dose ibuprofen for fevers. Stay well hydrated. Take zofran for nausea. Have a recheck in 48 hrs and return for any concerns.  If you were given medicines take as directed.  If you are on coumadin or contraceptives realize their levels and effectiveness is altered by many different medicines.  If you have any reaction (rash, tongues swelling, other) to the medicines stop taking and see a physician.   Please follow up as directed and return to the ER or see a physician for new or worsening symptoms.  Thank you.

## 2013-05-11 NOTE — ED Provider Notes (Signed)
CSN: [id number]     Arrival date & time 05/11/13  1415 History  This chart was scribed for Mariea Clonts, MD,  by Stacy Gardner, ED Scribe. The patient was seen in room APA07/APA07 and the patient's care was started at 2:47 PM.    First MD Initiated Contact with Patient 05/11/13 1423     Chief Complaint  Patient presents with  . Emesis  . Chills  . Cough   (Consider location/radiation/quality/duration/timing/severity/associated sxs/prior Treatment) HPI HPI Comments: Dale [name] is a 80 y.o. male who presents to the Emergency Department complaining of emesis, chills and cough . Pt has vomited four times and has associated abdominal pain.  He has the associated symptoms of nausea and congestion. Pt denies diarrhea. He denies sick contact. Pt had coronary angioplasty with two stent placement.  Pt currently takes Plavix. He denies recent travels, bowel obstruction hx or any abdominal surgeries. He has not taken anything for his symptoms.  Past Medical History  Diagnosis Date  . Coronary heart disease     s/p stenting. cath in 01/2012 noncritical occlusion  . Idiopathic thrombocytopenic purpura 2002  . Allergic rhinitis   . Sarcoidosis     pulmonary  . Hyperlipidemia   . Hypertension   . Macular degeneration   . Hypothyroidism   . Chest pain   . GERD (gastroesophageal reflux disease)   . CHF (congestive heart failure)   . Cancer     skin  . Asthma   . Glaucoma   . Nephrolithiasis   . PUD (peptic ulcer disease)     remote  . Schatzki's ring   . Hiatal hernia   . Anal fissure    Past Surgical History  Procedure Laterality Date  . Mediastinoscopy      for dx sarcoid  . Coronary angioplasty with stent placement    . Cardiac stents    . Esophagogastroduodenoscopy  06/19/2004    Two esophageal rings and esophageal web as described above.  All of these were disrupted by passing 56-French Venia Minks dilator/ Candida esophagitis,which appears to be incidental given history of    antibiotic use, but nevertheless will be treated.  . Esophagogastroduodenoscopy  10/30/2006    Distal tandem esophageal ring status post dilation disruption as  described above.  Otherwise normal esophagus/  Small hiatal hernia otherwise normal stomach, D1 and D2  . Colonoscopy  10/30/2006    Normal rectum, sigmoid diverticula.Remainder of colonic mucosa appeared normal.  . Esophagogastroduodenoscopy (egd) with esophageal dilation  03/04/2012    RMR- schatzki's ring, hiatal hernia, polypoid gastric mucosa, bx= minimally active gastritis.   Family History  Problem Relation Age of Onset  . Heart disease Father     deceased age 38  . Stroke Mother   . Alzheimer's disease Mother   . Heart attack Brother     deceased age 58  . Colon cancer Neg Hx   . Cancer Other     niece   History  Substance Use Topics  . Smoking status: Former Smoker -- 0.10 packs/day for 2 years    Types: Cigarettes, Cigars    Quit date: 05/06/1970  . Smokeless tobacco: Never Used  . Alcohol Use: No    Review of Systems  Constitutional: Negative for fever and chills.  HENT: Positive for congestion and sore throat.   Eyes: Negative for visual disturbance.  Respiratory: Positive for cough. Negative for shortness of breath.   Cardiovascular: Negative for chest pain and leg swelling.  Gastrointestinal: Positive  for nausea, vomiting and abdominal pain. Negative for diarrhea.  Genitourinary: Negative for dysuria.  Musculoskeletal: Negative for neck pain and neck stiffness.  Skin: Negative for rash.  Neurological: Negative for weakness and headaches.  All other systems reviewed and are negative.    Allergies  Doxazosin; Acetaminophen; Atenolol; Hydrocodone; Morphine; Other; Penicillins; and Sulfonamide derivatives  Home Medications   Current Outpatient Rx  Name  Route  Sig  Dispense  Refill  . aspirin 81 MG tablet   Oral   Take 81 mg by mouth daily.           . clopidogrel (PLAVIX) 75 MG tablet    Oral   Take 1 tablet (75 mg total) by mouth daily.   90 tablet   3   . dextromethorphan-guaiFENesin (MUCINEX DM) 30-600 MG per 12 hr tablet   Oral   Take 1 tablet by mouth daily as needed.          . dorzolamide (TRUSOPT) 2 % ophthalmic solution   Both Eyes   Place 1 drop into both eyes 2 (two) times daily.          Marland Kitchen ezetimibe (ZETIA) 10 MG tablet   Oral   Take 1 tablet (10 mg total) by mouth daily.   90 tablet   3   . finasteride (PROSCAR) 5 MG tablet   Oral   Take 5 mg by mouth daily.           . hydrochlorothiazide (MICROZIDE) 12.5 MG capsule   Oral   Take 12.5 mg by mouth daily as needed.          . isosorbide mononitrate (IMDUR) 60 MG 24 hr tablet   Oral   Take 1 tablet (60 mg total) by mouth daily.   90 tablet   3   . latanoprost (XALATAN) 0.005 % ophthalmic solution   Both Eyes   Place 1 drop into both eyes at bedtime.         Marland Kitchen levothyroxine (SYNTHROID, LEVOTHROID) 100 MCG tablet   Oral   Take 100 mcg by mouth daily.         Marland Kitchen losartan (COZAAR) 100 MG tablet   Oral   Take 1 tablet (100 mg total) by mouth daily.   90 tablet   3   . metoprolol succinate (TOPROL-XL) 25 MG 24 hr tablet   Oral   Take 12.5 mg by mouth 2 (two) times daily.          . Multiple Vitamin (MULTIVITAMIN) tablet   Oral   Take 1 tablet by mouth daily.           . Multiple Vitamins-Minerals (PRESERVISION/LUTEIN) CAPS   Oral   Take 2 capsules by mouth daily.          . nitroGLYCERIN (NITROSTAT) 0.4 MG SL tablet   Sublingual   Place 1 tablet (0.4 mg total) under the tongue every 5 (five) minutes as needed. For chest pain   25 tablet   2   . pantoprazole (PROTONIX) 40 MG tablet   Oral   Take 1 tablet (40 mg total) by mouth daily.   90 tablet   3   . potassium chloride (K-DUR) 10 MEQ tablet   Oral   Take 1 tablet (10 mEq total) by mouth daily.   90 tablet   3   . PRESCRIPTION MEDICATION   Injection   Inject 1 each as directed once a week. Allergy  shot= thursday         .  rosuvastatin (CRESTOR) 5 MG tablet   Oral   Take 1 tablet (5 mg total) by mouth daily.   28 tablet   0     Lot: [id number] Exp: 06/2015.   . timolol (TIMOPTIC) 0.5 % ophthalmic solution   Both Eyes   Place 1 drop into both eyes 2 (two) times daily.          Marland Kitchen triamcinolone (KENALOG) 0.1 % cream   Topical   Apply topically 2 (two) times daily as needed.           . vitamin C (ASCORBIC ACID) 500 MG tablet   Oral   Take 500 mg by mouth daily.           . vitamin E 200 UNIT capsule   Oral   Take 200 Units by mouth daily.            BP 146/65  Pulse 106  Temp(Src) 101.4 F (38.6 C) (Oral)  SpO2 95% Physical Exam  Nursing note and vitals reviewed. Constitutional: He appears well-developed and well-nourished. No distress.  Appears generally weak  HENT:  Head: Normocephalic and atraumatic.  Mouth/Throat: Mucous membranes are dry.  No trismus, uvular deviation, unilateral posterior pharyngeal edema or submandibular swelling.   Eyes: Conjunctivae are normal.  Neck: Normal range of motion. Neck supple.  Cardiovascular: Tachycardia present.   Murmur (sm) heard. 2+ systolic of left sternal border  Pulmonary/Chest: He has rales (spare).  Abdominal: Soft. He exhibits no distension. There is no tenderness. There is no guarding.  Musculoskeletal: He exhibits no edema and no tenderness.  Neurological: He is alert. No cranial nerve deficit.  Skin: Skin is warm.  Psychiatric: He has a normal mood and affect.    ED Course  Procedures (including critical care time) DIAGNOSTIC STUDIES: Oxygen Saturation is 95% on room air, normal by my interpretation.    COORDINATION OF CARE:  2:51 PM Discussed course of care with pt . Pt understands and agrees.    Labs Review Labs Reviewed  CBC WITH DIFFERENTIAL - Abnormal; Notable for the following:    RBC 3.76 (*)    Hemoglobin 12.8 (*)    HCT 38.2 (*)    MCV 101.6 (*)    Lymphocytes Relative 7 (*)     Monocytes Relative 20 (*)    Lymphs Abs 0.5 (*)    Monocytes Absolute 1.5 (*)    All other components within normal limits  COMPREHENSIVE METABOLIC PANEL - Abnormal; Notable for the following:    Sodium 136 (*)    Glucose, Bld 127 (*)    GFR calc non Af Amer 83 (*)    All other components within normal limits  LACTIC ACID, PLASMA   Imaging Review Dg Chest 2 View  05/11/2013   CLINICAL DATA:  Cough and chills.  Emesis.  History of sarcoidosis.  EXAM: CHEST  2 VIEW  COMPARISON:  CT chest 10/01/2011. PA and lateral chest 09/04/2012 and 02/18/2013.  FINDINGS: Hilar and mediastinal lymphadenopathy and pulmonary scar consistent with sarcoidosis are again seen. There is no new airspace disease. No pleural effusion is identified. Heart size is normal. No pneumothorax.  IMPRESSION: No acute finding. Changes consistent with sarcoidosis appear stable compared to the prior exams.   Electronically Signed   By: Inge Rise M.D.   On: 05/11/2013 15:53    EKG Interpretation   None       MDM   1. Flu syndrome   2. Fever   3. Dehydration  Flu sxs clinically with dehydration.  Well appearing.  Antipyretics given, held tylenol initially due to allergy, clarified with pt he cannot recall but says not severe and wishes to try in ED.   Fluids given.  Pt tolerating po, labs okay. Discussed observation vs close fup outpt with pt and wife/ son, he prefers to try to go home for 1-2 days.  Filed Vitals:   05/11/13 1730 05/11/13 1731 05/11/13 1800 05/11/13 1835  BP: 128/59 128/59 136/54   Pulse:  90 103   Temp:  100.8 F (38.2 C)  99.6 F (37.6 C)  TempSrc:  Axillary    Resp:  20    SpO2:  96% 96%     Results and differential diagnosis were discussed with the patient. Close follow up outpatient was discussed, patient comfortable with the plan.   Diagnosis: above    Mariea Clonts, MD 05/11/13 [phone]

## 2013-05-11 NOTE — ED Notes (Signed)
Pt c/o waking up feeling nauseated, chills, and congested.  Reports has vomited several times.  Denies diarrhea.

## 2013-05-13 DIAGNOSIS — J210 Acute bronchiolitis due to respiratory syncytial virus: Secondary | ICD-10-CM | POA: Diagnosis not present

## 2013-05-13 DIAGNOSIS — J309 Allergic rhinitis, unspecified: Secondary | ICD-10-CM | POA: Diagnosis not present

## 2013-05-13 DIAGNOSIS — J111 Influenza due to unidentified influenza virus with other respiratory manifestations: Secondary | ICD-10-CM | POA: Diagnosis not present

## 2013-05-20 DIAGNOSIS — R059 Cough, unspecified: Secondary | ICD-10-CM | POA: Diagnosis not present

## 2013-05-20 DIAGNOSIS — J111 Influenza due to unidentified influenza virus with other respiratory manifestations: Secondary | ICD-10-CM | POA: Diagnosis not present

## 2013-05-20 DIAGNOSIS — R05 Cough: Secondary | ICD-10-CM | POA: Diagnosis not present

## 2013-05-20 DIAGNOSIS — Z681 Body mass index (BMI) 19 or less, adult: Secondary | ICD-10-CM | POA: Diagnosis not present

## 2013-05-20 DIAGNOSIS — J309 Allergic rhinitis, unspecified: Secondary | ICD-10-CM | POA: Diagnosis not present

## 2013-05-24 ENCOUNTER — Telehealth: Payer: Self-pay | Admitting: Cardiovascular Disease

## 2013-05-24 MED ORDER — ROSUVASTATIN CALCIUM 5 MG PO TABS: 5.0000 mg | ORAL_TABLET | Freq: Every evening | ORAL | Status: DC

## 2013-05-24 NOTE — Telephone Encounter (Signed)
Wants to get samples of Crestor 5 mg.  Please call.

## 2013-05-24 NOTE — Telephone Encounter (Signed)
Returned call and wife informed samples left at front desk.  Verbalized understanding and agreed w/ plan.    

## 2013-05-25 DIAGNOSIS — Z961 Presence of intraocular lens: Secondary | ICD-10-CM | POA: Diagnosis not present

## 2013-05-25 DIAGNOSIS — H40059 Ocular hypertension, unspecified eye: Secondary | ICD-10-CM | POA: Diagnosis not present

## 2013-05-25 DIAGNOSIS — H04129 Dry eye syndrome of unspecified lacrimal gland: Secondary | ICD-10-CM | POA: Diagnosis not present

## 2013-05-27 DIAGNOSIS — J309 Allergic rhinitis, unspecified: Secondary | ICD-10-CM | POA: Diagnosis not present

## 2013-06-01 DIAGNOSIS — H40059 Ocular hypertension, unspecified eye: Secondary | ICD-10-CM | POA: Diagnosis not present

## 2013-06-03 ENCOUNTER — Encounter: Payer: Self-pay | Admitting: Internal Medicine

## 2013-06-03 ENCOUNTER — Ambulatory Visit (INDEPENDENT_AMBULATORY_CARE_PROVIDER_SITE_OTHER): Payer: Medicare Other | Admitting: Internal Medicine

## 2013-06-03 ENCOUNTER — Ambulatory Visit (INDEPENDENT_AMBULATORY_CARE_PROVIDER_SITE_OTHER)
Admission: RE | Admit: 2013-06-03 | Discharge: 2013-06-03 | Disposition: A | Discharge status: Home / Self Care | Payer: Medicare Other | Source: Ambulatory Visit | Attending: Internal Medicine | Admitting: Internal Medicine

## 2013-06-03 VITALS — BP 136/80 | HR 60 | Ht 69.0 in | Wt 135.4 lb

## 2013-06-03 DIAGNOSIS — M545 Low back pain, unspecified: Secondary | ICD-10-CM

## 2013-06-03 DIAGNOSIS — R079 Chest pain, unspecified: Secondary | ICD-10-CM | POA: Diagnosis not present

## 2013-06-03 DIAGNOSIS — J309 Allergic rhinitis, unspecified: Secondary | ICD-10-CM | POA: Diagnosis not present

## 2013-06-03 DIAGNOSIS — D869 Sarcoidosis, unspecified: Secondary | ICD-10-CM

## 2013-06-03 NOTE — Patient Instructions (Addendum)
Order- CXR  Dx Right back pain, hx sarcoid, hx pneumothorax  Try heating pad and tylenol or advil for pain if needed.   Call us as needed

## 2013-06-03 NOTE — Assessment & Plan Note (Signed)
Favor musculoskeletal pain. Possible nerve root irritation from spine. Less likely another torn adhesion. Plan- CXR, symptomatic treatment.

## 2013-06-03 NOTE — Progress Notes (Signed)
Subjective:    Patient ID: Dale [name], male    DOB: July 12, 1928, 80 y.o.   MRN: [id number]  HPI 12/27/10-80 year old male never smoker followed for Allergic rhinitis, asthma,  hx sarcoid, glaucoma, history ITP Last here Sep 21, 2009-note reviewed. Allergy vaccine - continues long term. We discussed option to stop or retest.  CO throat tickle at night. Eating or drink, or melting ice in mouth will break it loose. Takes daily mucinex. Feels travatan eye drop get into nose and cause some swelling  02/10/12- 80 year old male never smoker followed for Allergic rhinitis, asthma,  hx sarcoid, complicated by glaucoma, history ITP Still on allergy vaccine 1:50 G0 and doing well with it; C/O yellow thick phelgm in throat Wants to wait on flu vaccine. In May had right hydropneumothorax on CT scan. Last week was in [place] for 2 days with left anterior chest pain/heart cath-"not heart". Some throat clearing of yellow phlegm but otherwise feels well. Had been having reflux. Dr Orson Ape treated Z-Pak, ending today. CT chest 10/01/11-reviewed with him IMPRESSION:  1. No evidence of pulmonary embolism. Mildly motion degraded  exam.  2. Development of a right-sided hydropneumothorax, small volume.  Critical test results telephoned to [facility], rn. at the time of  interpretation at 12:38 p.m. on 10/01/2011.  3. Findings of sarcoidosis, similar to on the prior exam. No  evidence of superimposed pneumonia or airspace disease.  4. Pulmonary artery enlargement suggests pulmonary arterial  hypertension.  Original Report Authenticated By: Areta Haber, M.D.  CXR 02/02/12-reviewed with him IMPRESSION:  Stable parenchymal densities in the right lung and right hilar  region. Findings are most likely secondary to sarcoidosis.  No evidence for pulmonary edema.  Original Report Authenticated By: Markus Daft, M.D.   08/10/12- 80 year old male never smoker followed for Allergic rhinitis, asthma,  hx sarcoid,  complicated by glaucoma, history ITP FOLLOWS FOR: still on allergy vaccine 1:50 GO and denies any flare ups at this time. Doxycycline at last office visit cleared his bronchitis. He is comfortable so far in the early spring. CXR 02/12/12 IMPRESSION:  Stable exam. No new or progressive findings.  Original Report Authenticated By: ERIC A. MANSELL, M.D.  02/18/13- 80 year old male never smoker followed for Allergic rhinitis, asthma,  hx sarcoid, Hx R hydropneumothorax 2013- torn adhesion, complicated by glaucoma, history ITP ACUTE VISIT: nasal congestion and burning("more less water/drainage"). allergy vaccine 1:50 GO Still feels this is ok and helpful Not a cold  06/03/13- 80 year old male never smoker followed for Allergic rhinitis, asthma,  hx Sarcoid, Hx R hydropneumothorax 2013- torn adhesion, complicated by glaucoma, history ITP ACUTE VISIT: patient states he started hurting in his lower back area on Saturday. Denies any SOB or wheezing. Has had a hard month-had the flu and medication reaction. Has had 2 rds of Levaquin (10 tablets each time). Z pak caused facial rash.  Had a lot of coughing w/ flu- now resolved. Now notes aching R infrascapular pain, comes and goes, not clearly positional. No dysuria. No SOB. Pain reminds him of when he tore adhesion and had PTX 2013. CXR 05/11/13 IMPRESSION:  No acute finding. Changes consistent with sarcoidosis appear stable  compared to the prior exams.  Electronically Signed  By: Inge Rise M.D.  On: 05/11/2013 15:53  Review of Systems- see HPI Constitutional:   No-   weight loss, night sweats, fevers, chills, fatigue, lassitude. HEENT:   No-  headaches, difficulty swallowing, tooth/dental problems, sore throat,       No-  sneezing,  itching, ear ache, + nasal congestion, +post nasal drip,  CV:  +chest pain- HPI, orthopnea, PND, swelling in lower extremities, anasarca, dizziness, palpitations Resp: No-acute shortness of breath with exertion or at  rest.             No- productive cough,  No non-productive cough,  No-  coughing up of blood.              No-   change in color of mucus.  No- wheezing.   Skin: No-   rash or lesions. GI:  No-   heartburn, indigestion, abdominal pain, nausea, vomiting,  GU:  MS:  No-   joint pain or swelling.  + backpain Neuro- nothing unusual  Psych:  No- change in mood or affect. No depression or anxiety.  No memory loss.    Objective:   Physical Exam General- Alert, Oriented, Affect-appropriate, Distress- none acute. Lean,   comfortable appearing elderly man Skin- rash-none, lesions- none, excoriation- none Lymphadenopathy- none Head- atraumatic            Eyes- Gross vision intact, PERRLA, conjunctivae clear secretions            Ears- Hearing- mildly reduced, some scarring right TM            Nose- Clear, no- Septal dev, , polyps, erosion, perforation             Throat- Mallampati II , mucosa clear , drainage- none, tonsils- atrophic. edentulous Neck- flexible , trachea midline, no stridor , thyroid nl, carotid no bruit Chest - symmetrical excursion , unlabored           Heart/CV- RRR w/ extra beats , no murmur , no gallop  , no rub, nl s1 s2                           - JVD- none , edema- none, stasis changes- none, varices- none           Lung- clear to P&A, wheeze- none, cough-none , dullness-none, rub- none           Chest wall- +no shock tenderness R mid/lower back Abd-  Br/ Gen/ Rectal- Not done, not indicated Extrem- cyanosis- none, clubbing, none, atrophy- none, strength- nl Neuro- grossly intact to observation    Assessment & Plan:

## 2013-06-04 ENCOUNTER — Telehealth: Payer: Self-pay | Admitting: Internal Medicine

## 2013-06-04 NOTE — Telephone Encounter (Signed)
Notes Recorded by Deneise Lever, MD on 06/03/2013 at 7:49 PM CXR- old sscarring is seen, but no sign of air-leak or other change to explain back pain.      Spoke with pt's spouse and notified of results per Dr. Annamaria Boots. Pt verbalized understanding and denied any questions.

## 2013-06-04 NOTE — Progress Notes (Signed)
Quick Note:  Spoke with pt's spouse and notified of results per Dr. Wert. She verbalized understanding and denied any questions.  ______ 

## 2013-06-07 ENCOUNTER — Other Ambulatory Visit: Payer: Self-pay | Admitting: *Deleted

## 2013-06-07 MED ORDER — METOPROLOL SUCCINATE ER 25 MG PO TB24: 12.5000 mg | ORAL_TABLET | Freq: Two times a day (BID) | ORAL | Status: DC

## 2013-06-10 ENCOUNTER — Telehealth: Payer: Self-pay | Admitting: Cardiovascular Disease

## 2013-06-10 DIAGNOSIS — J309 Allergic rhinitis, unspecified: Secondary | ICD-10-CM | POA: Diagnosis not present

## 2013-06-10 MED ORDER — METOPROLOL SUCCINATE ER 25 MG PO TB24: 12.5000 mg | ORAL_TABLET | Freq: Two times a day (BID) | ORAL | Status: DC

## 2013-06-10 NOTE — Telephone Encounter (Signed)
States that he is taking Metoprolol tartrate and not Succinate . It was refilled for the metoprolol succinate and that one is very expensive .Marland Kitchen Please call at [phone]   Thanks

## 2013-06-10 NOTE — Telephone Encounter (Signed)
Returned [name]' call conerning Metoprolol.   Patient IS IN FACT taking METOPROLOL SUCCINATE - per a print out from [facility] and per the list the patient brought to his appt on 04/05/2013 with Dr Claiborne Billings.   Patient's wife received a phone call from [facility] stating they received a prescription for Metoprolol Tartrate (this prescription would cost them more).  I could not find in the chart where anyone had sent in refills for tartrate, but I reassured her I would send in a new prescription clarifying which medicine patient actually takes.  Rx for Metoprolol Succinate sent into pharmacy electronically.

## 2013-06-15 ENCOUNTER — Other Ambulatory Visit: Payer: Self-pay | Admitting: *Deleted

## 2013-06-15 DIAGNOSIS — R0789 Other chest pain: Secondary | ICD-10-CM

## 2013-06-17 DIAGNOSIS — J309 Allergic rhinitis, unspecified: Secondary | ICD-10-CM | POA: Diagnosis not present

## 2013-06-24 DIAGNOSIS — J309 Allergic rhinitis, unspecified: Secondary | ICD-10-CM | POA: Diagnosis not present

## 2013-06-25 ENCOUNTER — Encounter: Payer: Self-pay | Admitting: *Deleted

## 2013-06-28 ENCOUNTER — Telehealth: Payer: Self-pay | Admitting: Cardiovascular Disease

## 2013-06-28 DIAGNOSIS — I10 Essential (primary) hypertension: Secondary | ICD-10-CM | POA: Diagnosis not present

## 2013-06-28 MED ORDER — ROSUVASTATIN CALCIUM 5 MG PO TABS: 5.0000 mg | ORAL_TABLET | Freq: Every evening | ORAL | Status: DC

## 2013-06-28 NOTE — Telephone Encounter (Signed)
Would lie some samples of Crestor 5 mg please.

## 2013-06-28 NOTE — Telephone Encounter (Signed)
Returned call and wife informed samples left at front desk.  Verbalized understanding and agreed w/ plan.    

## 2013-06-30 ENCOUNTER — Ambulatory Visit (INDEPENDENT_AMBULATORY_CARE_PROVIDER_SITE_OTHER): Payer: Medicare Other | Admitting: Surgery

## 2013-06-30 ENCOUNTER — Ambulatory Visit
Admission: RE | Admit: 2013-06-30 | Discharge: 2013-06-30 | Disposition: A | Discharge status: Home / Self Care | Payer: Medicare Other | Source: Ambulatory Visit | Attending: Surgery | Admitting: Surgery

## 2013-06-30 ENCOUNTER — Encounter: Payer: Self-pay | Admitting: Surgery

## 2013-06-30 VITALS — BP 178/85 | HR 80 | Resp 16 | Ht 68.0 in | Wt 128.0 lb

## 2013-06-30 DIAGNOSIS — R071 Chest pain on breathing: Secondary | ICD-10-CM

## 2013-06-30 DIAGNOSIS — R0789 Other chest pain: Secondary | ICD-10-CM

## 2013-06-30 DIAGNOSIS — J984 Other disorders of lung: Secondary | ICD-10-CM | POA: Diagnosis not present

## 2013-06-30 NOTE — Progress Notes (Signed)
HPI:  The patient is an 80 year old gentleman with a history of sarcoid who I saw in June 2013 after he presented with chest pain and a CT scan showed a small right pneumothorax and scarring in the lungs from sarcoid. It was not felt that this required any intervention and his pain resolved. He called my office last week and wanted to be seen to evaluate similar right sided back and chest pain of several weeks duration. He says he was diagnosed with the flu in January and shortly afterward developed this pain which was quite severe and made worse with deep breathing and lying down. He says that since Sunday the pain has significantly improved. He denies any cough or sputum production and has no dyspnea now.  Current Outpatient Prescriptions  Medication Sig Dispense Refill  . aspirin 81 MG tablet Take 81 mg by mouth every morning.       . clopidogrel (PLAVIX) 75 MG tablet Take 1 tablet (75 mg total) by mouth daily.  90 tablet  3  . dextromethorphan-guaiFENesin (MUCINEX DM) 30-600 MG per 12 hr tablet Take 1 tablet by mouth every morning.       . dorzolamide (TRUSOPT) 2 % ophthalmic solution Place 1 drop into both eyes 2 (two) times daily.       Marland Kitchen ezetimibe (ZETIA) 10 MG tablet Take 1 tablet (10 mg total) by mouth daily.  90 tablet  3  . finasteride (PROSCAR) 5 MG tablet Take 5 mg by mouth daily.        . hydrochlorothiazide (MICROZIDE) 12.5 MG capsule Take 12.5 mg by mouth daily as needed.       . isosorbide mononitrate (IMDUR) 60 MG 24 hr tablet Take 1 tablet (60 mg total) by mouth daily.  90 tablet  3  . latanoprost (XALATAN) 0.005 % ophthalmic solution Place 1 drop into both eyes at bedtime.      Marland Kitchen levothyroxine (SYNTHROID, LEVOTHROID) 100 MCG tablet Take 100 mcg by mouth daily.      Marland Kitchen losartan (COZAAR) 100 MG tablet Take 1 tablet (100 mg total) by mouth daily.  90 tablet  3  . metoprolol succinate (TOPROL-XL) 25 MG 24 hr tablet Take 0.5 tablets (12.5 mg total) by mouth 2 (two) times daily.   90 tablet  3  . Multiple Vitamin (MULTIVITAMIN) tablet Take 1 tablet by mouth every evening.       . Multiple Vitamins-Minerals (PRESERVISION/LUTEIN) CAPS Take 1 capsule by mouth 2 (two) times daily.       . nitroGLYCERIN (NITROSTAT) 0.4 MG SL tablet Place 1 tablet (0.4 mg total) under the tongue every 5 (five) minutes as needed. For chest pain  25 tablet  2  . ondansetron (ZOFRAN ODT) 4 MG disintegrating tablet 4mg  ODT q4 hours prn nausea/vomit  4 tablet  0  . pantoprazole (PROTONIX) 40 MG tablet Take 40 mg by mouth daily as needed (FOR GERD).      Marland Kitchen potassium chloride (K-DUR) 10 MEQ tablet Take 1 tablet (10 mEq total) by mouth daily.  90 tablet  3  . PRESCRIPTION MEDICATION Inject 1 each as directed once a week. Allergy shot= thursday      . rosuvastatin (CRESTOR) 5 MG tablet Take 1 tablet (5 mg total) by mouth every evening.  28 tablet  0  . timolol (TIMOPTIC) 0.5 % ophthalmic solution Place 1 drop into both eyes 2 (two) times daily.       Marland Kitchen triamcinolone (KENALOG) 0.1 %  cream Apply topically 2 (two) times daily as needed.        . vitamin C (ASCORBIC ACID) 500 MG tablet Take 500 mg by mouth every evening.       . vitamin E 200 UNIT capsule Take 200 Units by mouth every evening.        No current facility-administered medications for this visit.     Physical Exam: BP 178/85  Pulse 80  Resp 16  Ht 5\' 8"  (1.727 m)  Wt 128 lb (58.06 kg)  BMI 19.47 kg/m2  SpO2 98% He is an elderly, thin gentleman in no distress Lung exam reveals clear BS throughout. Cardiovascular: Normal rate, regular rhythm, normal heart sounds and intact distal pulses. Exam reveals no gallop and no friction rub.  No murmur heard  Diagnostic Tests:  CLINICAL DATA: Chest wall pain. Pain radiates from the mid back  into the right anterior ribs.  EXAM:  CT CHEST WITHOUT CONTRAST  TECHNIQUE:  Multidetector CT imaging of the chest was performed following the  standard protocol without IV contrast.  COMPARISON:  Chest x-ray dated 06/03/2013 and chest CT dated  10/01/2011  FINDINGS:  The right ribs appear normal. Flowing right anterior lateral  osteophytes fuse the thoracic spine from T4-T10. There is no disc  space narrowing. No foraminal or spinal stenosis or facet arthritis.  The patient has chronic scarring in both hilar regions with scarring  along the fissures in both lungs. There is parenchymal scarring  extending to the pleural surface posterior laterally in the right  upper lobe.  Heart size is normal. There are multiple calcified lymph nodes in  the mediastinum. Heart size is normal. No acute infiltrates or  effusions. Visualized portion of the upper abdomen is normal except  for cysts in the dome of the liver.  IMPRESSION:  1. Normal. Right ribs.  2. 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  3. Pulmonary parenchymal scarring tethered to the pleural surface  posterior laterally at approximately the posterior aspect of the  right sixth and seventh ribs.  Electronically Signed  By: Rozetta Nunnery M.D.  On: 06/30/2013 14:13   Impression:  There is no pneumothorax but some pulmonary parenchymal scarring tethered to the pleural surface posterolaterally. I suspect that he made have had some pleurisy related to his episode of flu and it now seems to be resolving. There is nothing acute on the CT scan.  Plan:  He will continue to follow-up with his primary physician.

## 2013-07-02 DIAGNOSIS — J309 Allergic rhinitis, unspecified: Secondary | ICD-10-CM | POA: Diagnosis not present

## 2013-07-07 DIAGNOSIS — H40059 Ocular hypertension, unspecified eye: Secondary | ICD-10-CM | POA: Diagnosis not present

## 2013-07-08 DIAGNOSIS — J309 Allergic rhinitis, unspecified: Secondary | ICD-10-CM | POA: Diagnosis not present

## 2013-07-15 DIAGNOSIS — L570 Actinic keratosis: Secondary | ICD-10-CM | POA: Diagnosis not present

## 2013-07-15 DIAGNOSIS — D235 Other benign neoplasm of skin of trunk: Secondary | ICD-10-CM | POA: Diagnosis not present

## 2013-07-15 DIAGNOSIS — C4441 Basal cell carcinoma of skin of scalp and neck: Secondary | ICD-10-CM | POA: Diagnosis not present

## 2013-07-16 DIAGNOSIS — J309 Allergic rhinitis, unspecified: Secondary | ICD-10-CM | POA: Diagnosis not present

## 2013-07-21 ENCOUNTER — Other Ambulatory Visit: Payer: Self-pay | Admitting: Cardiovascular Disease

## 2013-07-22 DIAGNOSIS — J309 Allergic rhinitis, unspecified: Secondary | ICD-10-CM | POA: Diagnosis not present

## 2013-07-26 ENCOUNTER — Telehealth: Payer: Self-pay | Admitting: Cardiovascular Disease

## 2013-07-26 MED ORDER — ROSUVASTATIN CALCIUM 5 MG PO TABS: 5.0000 mg | ORAL_TABLET | Freq: Every evening | ORAL | Status: DC

## 2013-07-26 NOTE — Telephone Encounter (Signed)
Returned call and wife informed samples left at front desk.  Verbalized understanding and agreed to inform pt.

## 2013-07-26 NOTE — Telephone Encounter (Signed)
He would like some samples of Crestor 5 mg please.Please let him know if you have any.

## 2013-07-29 DIAGNOSIS — J309 Allergic rhinitis, unspecified: Secondary | ICD-10-CM | POA: Diagnosis not present

## 2013-08-02 ENCOUNTER — Telehealth: Payer: Self-pay | Admitting: *Deleted

## 2013-08-02 MED ORDER — METOPROLOL TARTRATE 25 MG PO TABS: 12.5000 mg | ORAL_TABLET | Freq: Two times a day (BID) | ORAL | Status: DC

## 2013-08-02 NOTE — Telephone Encounter (Signed)
Walk-In Message received: "About medicine"  Spoke w/ pt and pt has metoprolol tartrate bottle  25 mg tabs: take 1/2 tab twice daily (empty)  and metoprolol succinate bottle 25 mg tabs: take 1/2 tab twice daily (new).  Stated he has always received the orange bottle (tart) and was shocked to see the blue bottle (succ).    RN explained they are the same medication, except one is long-acting (succ, blue) and the other is short-acting (tart, orange).  Pt verbalized understanding and prefers to take the metoprolol tart the way he has been.  Informed RN will send in script as there is no evidence to show a change from one to the other.  Last OV note before change to Epic, pt was taking metoprolol tart 12.5 mg BID.  Change was made in Epic when med list entered.    Rx sent to pharmacy and confirmation received.  Pt stated he will take the 90-day supply of succinate he has and wait for new script when it's due.  Message was sen to pharmacy with that information and to call the office with concerns.  Pt verbalized understanding and agreed w/ plan.  Pt discharged and RN gave contact number for him to call back if needed.

## 2013-08-05 DIAGNOSIS — J309 Allergic rhinitis, unspecified: Secondary | ICD-10-CM | POA: Diagnosis not present

## 2013-08-06 NOTE — Telephone Encounter (Signed)
ok 

## 2013-08-10 ENCOUNTER — Ambulatory Visit (INDEPENDENT_AMBULATORY_CARE_PROVIDER_SITE_OTHER): Payer: Medicare Other

## 2013-08-10 DIAGNOSIS — J309 Allergic rhinitis, unspecified: Secondary | ICD-10-CM

## 2013-08-12 DIAGNOSIS — J309 Allergic rhinitis, unspecified: Secondary | ICD-10-CM | POA: Diagnosis not present

## 2013-08-19 DIAGNOSIS — J309 Allergic rhinitis, unspecified: Secondary | ICD-10-CM | POA: Diagnosis not present

## 2013-09-02 DIAGNOSIS — J309 Allergic rhinitis, unspecified: Secondary | ICD-10-CM | POA: Diagnosis not present

## 2013-09-06 ENCOUNTER — Telehealth: Payer: Self-pay | Admitting: Cardiovascular Disease

## 2013-09-06 NOTE — Telephone Encounter (Signed)
Returned call.  Wife informed no samples available.  Verbalized understanding.

## 2013-09-06 NOTE — Telephone Encounter (Signed)
Would like to get some samples of Crestor 5 mg.  Please call

## 2013-09-09 DIAGNOSIS — J309 Allergic rhinitis, unspecified: Secondary | ICD-10-CM | POA: Diagnosis not present

## 2013-09-16 DIAGNOSIS — J309 Allergic rhinitis, unspecified: Secondary | ICD-10-CM | POA: Diagnosis not present

## 2013-09-22 ENCOUNTER — Telehealth: Payer: Self-pay | Admitting: *Deleted

## 2013-09-22 MED ORDER — ROSUVASTATIN CALCIUM 5 MG PO TABS: 5.0000 mg | ORAL_TABLET | Freq: Every evening | ORAL | Status: DC

## 2013-09-22 NOTE — Telephone Encounter (Signed)
Patient walked in requesting samples.  crestor 5mg  samples given to patient.

## 2013-09-23 DIAGNOSIS — J309 Allergic rhinitis, unspecified: Secondary | ICD-10-CM | POA: Diagnosis not present

## 2013-09-28 ENCOUNTER — Other Ambulatory Visit: Payer: Self-pay

## 2013-09-28 MED ORDER — DEXLANSOPRAZOLE 60 MG PO CPDR: 60.0000 mg | DELAYED_RELEASE_CAPSULE | Freq: Every day | ORAL | Status: DC

## 2013-09-28 NOTE — Telephone Encounter (Signed)
Due for 1 yr f/u.

## 2013-09-30 DIAGNOSIS — J309 Allergic rhinitis, unspecified: Secondary | ICD-10-CM | POA: Diagnosis not present

## 2013-10-01 DIAGNOSIS — H3530 Unspecified macular degeneration: Secondary | ICD-10-CM | POA: Diagnosis not present

## 2013-10-01 DIAGNOSIS — H35059 Retinal neovascularization, unspecified, unspecified eye: Secondary | ICD-10-CM | POA: Diagnosis not present

## 2013-10-07 DIAGNOSIS — J309 Allergic rhinitis, unspecified: Secondary | ICD-10-CM | POA: Diagnosis not present

## 2013-10-08 ENCOUNTER — Encounter: Payer: Self-pay | Admitting: Cardiovascular Disease

## 2013-10-08 ENCOUNTER — Ambulatory Visit (INDEPENDENT_AMBULATORY_CARE_PROVIDER_SITE_OTHER): Payer: Medicare Other | Admitting: Cardiovascular Disease

## 2013-10-08 VITALS — BP 136/60 | HR 54 | Ht 68.0 in | Wt 134.3 lb

## 2013-10-08 DIAGNOSIS — E039 Hypothyroidism, unspecified: Secondary | ICD-10-CM

## 2013-10-08 DIAGNOSIS — I440 Atrioventricular block, first degree: Secondary | ICD-10-CM | POA: Insufficient documentation

## 2013-10-08 DIAGNOSIS — K219 Gastro-esophageal reflux disease without esophagitis: Secondary | ICD-10-CM | POA: Diagnosis not present

## 2013-10-08 DIAGNOSIS — R079 Chest pain, unspecified: Secondary | ICD-10-CM | POA: Diagnosis not present

## 2013-10-08 DIAGNOSIS — I10 Essential (primary) hypertension: Secondary | ICD-10-CM

## 2013-10-08 DIAGNOSIS — I2510 Atherosclerotic heart disease of native coronary artery without angina pectoris: Secondary | ICD-10-CM

## 2013-10-08 MED ORDER — NITROGLYCERIN 0.4 MG SL SUBL: 0.4000 mg | SUBLINGUAL_TABLET | SUBLINGUAL | Status: DC | PRN

## 2013-10-08 NOTE — Patient Instructions (Signed)
Your physician recommends that you schedule a follow-up appointment in: [place] with Dr.Kelly.

## 2013-10-08 NOTE — Progress Notes (Signed)
Patient ID: Dale [name], male   DOB: 09-May-1928, 80 y.o.   MRN: [id number]      HPI: Dale [name] is a 80 y.o. male who presents to the office for a seven-month cardiology evaluation.  [name] has known coronary artery disease and in 2000 underwent initial intervention to his right coronary artery. In 2009 a Cypher stent was placed beyond the acute margin of his RCA. His last cardiac catheterization was in September 2013 by Dr. [name] which showed 30% LAD narrowing after the second diagonal vessel, and normal left circumflex coronary artery, and pain right coronary artery stents.  Has a history of hypothyroidism on Synthroid replacement, hyperlipidemia on Crestor 5 mg plus Zetia 10 mg, GERD, on Protonix, and continues to be on dual antiplatelet therapy. He is responsive to Plavix on previous P2 Y12 testing.   over the past 7 months, he has remained relatively stable.  He has noticed just rare episodes of chest pain.  His last episode of chest pain was approximately 10 days ago, when he experienced some left-sided discomfort, which was nonexertional.  He took a total of 3 nitroglycerin and ultimately this subsided.  It was not prompt relief with nitroglycerin.  He had any exertional chest pain and over the past 7 months has only experienced approximately one to 2 episodes of chest pain.  He is unaware of any further palpitations, which did improve when he discontinued his Mucinex D. after I discussed with him the importance of avoiding pseudoephedrine.  Presently he denies some breath.  He denies PND or orthopnea.  There are no palpitations.  He denies myalgias.  Past Medical History  Diagnosis Date  . Coronary heart disease     s/p stenting. cath in 01/2012 noncritical occlusion  . Idiopathic thrombocytopenic purpura 2002  . Allergic rhinitis   . Sarcoidosis     pulmonary  . Hyperlipidemia   . Hypertension   . Macular degeneration   . Hypothyroidism   . Chest pain   . GERD  (gastroesophageal reflux disease)   . CHF (congestive heart failure)   . Cancer     skin  . Asthma   . Glaucoma   . Nephrolithiasis   . PUD (peptic ulcer disease)     remote  . Schatzki's ring   . Hiatal hernia   . Anal fissure     Past Surgical History  Procedure Laterality Date  . Mediastinoscopy      for dx sarcoid  . Coronary angioplasty with stent placement    . Cardiac stents    . Esophagogastroduodenoscopy  06/19/2004    Two esophageal rings and esophageal web as described above.  All of these were disrupted by passing 56-French Venia Minks dilator/ Candida esophagitis,which appears to be incidental given history of   antibiotic use, but nevertheless will be treated.  . Esophagogastroduodenoscopy  10/30/2006    Distal tandem esophageal ring status post dilation disruption as  described above.  Otherwise normal esophagus/  Small hiatal hernia otherwise normal stomach, D1 and D2  . Colonoscopy  10/30/2006    Normal rectum, sigmoid diverticula.Remainder of colonic mucosa appeared normal.  . Esophagogastroduodenoscopy (egd) with esophageal dilation  03/04/2012    RMR- schatzki's ring, hiatal hernia, polypoid gastric mucosa, bx= minimally active gastritis.    Allergies  Allergen Reactions  . Azithromycin Other (See Comments)    Sore mouth and fever blisters around mouth, sores in nose area as well  . Doxazosin Shortness Of Breath  . Acetaminophen  REACTION: UNKNOWN REACTION  . Atenolol     REACTION: UNKNOWN REACTION  . Hydrocodone Nausea And Vomiting  . Morphine     "made me crazy"  . Penicillins Nausea And Vomiting  . Sulfonamide Derivatives Nausea And Vomiting    Current Outpatient Prescriptions  Medication Sig Dispense Refill  . aspirin 81 MG tablet Take 81 mg by mouth every morning.       . clopidogrel (PLAVIX) 75 MG tablet Take 1 tablet (75 mg total) by mouth daily.  90 tablet  3  . dexlansoprazole (DEXILANT) 60 MG capsule Take 1 capsule (60 mg total) by mouth  daily.  90 capsule  3  . dextromethorphan-guaiFENesin (MUCINEX DM) 30-600 MG per 12 hr tablet Take 1 tablet by mouth every morning.       . dorzolamide (TRUSOPT) 2 % ophthalmic solution Place 1 drop into both eyes 2 (two) times daily.       Marland Kitchen ezetimibe (ZETIA) 10 MG tablet Take 1 tablet (10 mg total) by mouth daily.  90 tablet  3  . finasteride (PROSCAR) 5 MG tablet Take 5 mg by mouth daily.        . hydrochlorothiazide (MICROZIDE) 12.5 MG capsule Take 12.5 mg by mouth daily as needed.       . isosorbide mononitrate (IMDUR) 60 MG 24 hr tablet Take 1 tablet (60 mg total) by mouth daily.  90 tablet  3  . latanoprost (XALATAN) 0.005 % ophthalmic solution Place 1 drop into both eyes at bedtime.      Marland Kitchen levothyroxine (SYNTHROID, LEVOTHROID) 100 MCG tablet Take 100 mcg by mouth daily.      Marland Kitchen losartan (COZAAR) 100 MG tablet Take 1 tablet (100 mg total) by mouth daily.  90 tablet  3  . metoprolol tartrate (LOPRESSOR) 25 MG tablet Take 0.5 tablets (12.5 mg total) by mouth 2 (two) times daily.  90 tablet  1  . Multiple Vitamin (MULTIVITAMIN) tablet Take 1 tablet by mouth every evening.       . Multiple Vitamins-Minerals (PRESERVISION/LUTEIN) CAPS Take 1 capsule by mouth 2 (two) times daily.       . nitroGLYCERIN (NITROSTAT) 0.4 MG SL tablet Place 1 tablet (0.4 mg total) under the tongue every 5 (five) minutes as needed. For chest pain  25 tablet  2  . ondansetron (ZOFRAN ODT) 4 MG disintegrating tablet 4mg  ODT q4 hours prn nausea/vomit  4 tablet  0  . pantoprazole (PROTONIX) 40 MG tablet Take 40 mg by mouth daily as needed (FOR GERD).      Marland Kitchen potassium chloride (K-DUR) 10 MEQ tablet Take 1 tablet (10 mEq total) by mouth daily.  90 tablet  3  . PRESCRIPTION MEDICATION Inject 1 each as directed once a week. Allergy shot= thursday      . rosuvastatin (CRESTOR) 5 MG tablet Take 1 tablet (5 mg total) by mouth every evening.  35 tablet  0  . timolol (TIMOPTIC) 0.5 % ophthalmic solution Place 1 drop into both eyes 2  (two) times daily.       Marland Kitchen triamcinolone (KENALOG) 0.1 % cream Apply topically 2 (two) times daily as needed.        . vitamin C (ASCORBIC ACID) 500 MG tablet Take 500 mg by mouth every evening.       . vitamin E 200 UNIT capsule Take 200 Units by mouth every evening.        No current facility-administered medications for this visit.    History  Social History  . Marital Status: Married    Spouse Name: N/A    Number of Children: 1  . Years of Education: N/A   Occupational History  . Retired     Visual merchandiser pumping station  .     Social History Main Topics  . Smoking status: Former Smoker -- 0.10 packs/day for 2 years    Types: Cigarettes, Cigars    Quit date: 05/06/1970  . Smokeless tobacco: Never Used  . Alcohol Use: No  . Drug Use: No  . Sexual Activity: No   Other Topics Concern  . Not on file   Social History Narrative  . No narrative on file   Additional social history is notable that he is married and lives with his wife. He quit smoking over 40 years ago. Has one child. He remains active.  Family History  Problem Relation Age of Onset  . Heart disease Father     deceased age 51  . Stroke Mother   . Alzheimer's disease Mother   . Heart attack Brother     deceased age 81  . Colon cancer Neg Hx   . Cancer Other     niece   ROS General: Negative; No fevers, chills, or night sweats;  HEENT: Negative; No changes in vision or hearing, sinus congestion, difficulty swallowing Pulmonary: Negative; No cough, wheezing, shortness of breath, hemoptysis Cardiovascular: See history of present illness, presyncope, syncope, palpatations GI: Negative; No nausea, vomiting, diarrhea, or abdominal pain GU: Negative; No dysuria, hematuria, or difficulty voiding Musculoskeletal: Negative; no myalgias, joint pain, or weakness Hematologic/Oncology: Negative; no easy bruising, bleeding Endocrine: Positive for hypothyroidism; no diabetes Neuro: Negative; no changes in  balance, headaches Skin: Negative; No rashes or skin lesions Psychiatric: Negative; No behavioral problems, depression Sleep: Negative; No snoring, daytime sleepiness, hypersomnolence, bruxism, restless legs, hypnogognic hallucinations, no cataplexy Other comprehensive 14 point system review is negative.   PE BP 136/60  Pulse 54  Ht 5\' 8"  (1.727 m)  Wt 134 lb 4.8 oz (60.918 kg)  BMI 20.42 kg/m2  General: Alert, oriented, no distress.  Skin: normal turgor, no rashes HEENT: Normocephalic, atraumatic. Pupils round and reactive; sclera anicteric;no lid lag.  Nose without nasal septal hypertrophy Mouth/Parynx benign; Mallinpatti scale 2 Neck: No JVD, no carotid bruits with normal carotid upstroke Lungs: clear to ausculatation and percussion; no wheezing or rales Chest wall: Nontender to palpation Heart: RRR, s1 s2 normal 1/6 systolic murmur, unchanged. No S3 gallop no rub Abdomen: soft, nontender; no hepatosplenomehaly, BS+; abdominal aorta nontender and not dilated by palpation. Back: No CVA tenderness Pulses 2+ Extremities: no clubbing cyanosis or edema, Homan's sign negative  Neurologic: grossly nonfocal Psychologic: normal affect and mood.  ECG (independently read by me): Sinus bradycardia 54 beats per minute.  First degree block with a PR interval of 232 ms.  No significant ST-T changes.  ECG: Sinus bradycardia 54 beats per minute. PR interval 206 ms. QTc interval normal  LABS:  BMET    Component Value Date/Time   NA 136* 05/11/2013 1511   K 3.9 05/11/2013 1511   CL 99 05/11/2013 1511   CO2 27 05/11/2013 1511   GLUCOSE 127* 05/11/2013 1511   BUN 14 05/11/2013 1511   CREATININE 0.76 05/11/2013 1511   CALCIUM 9.2 05/11/2013 1511   GFRNONAA 83* 05/11/2013 1511   GFRAA >90 05/11/2013 1511     Hepatic Function Panel     Component Value Date/Time   PROT 6.7 05/11/2013 1511  ALBUMIN 3.6 05/11/2013 1511   AST 19 05/11/2013 1511   ALT 26 05/11/2013 1511   ALKPHOS 74 05/11/2013 1511   BILITOT 0.4  05/11/2013 1511   BILIDIR 0.1 11/17/2007 1400   IBILI 0.5 11/17/2007 1400     CBC    Component Value Date/Time   WBC 7.3 05/11/2013 1510   RBC 3.76* 05/11/2013 1510   HGB 12.8* 05/11/2013 1510   HCT 38.2* 05/11/2013 1510   PLT 157 05/11/2013 1510   MCV 101.6* 05/11/2013 1510   MCH 34.0 05/11/2013 1510   MCHC 33.5 05/11/2013 1510   RDW 13.9 05/11/2013 1510   LYMPHSABS 0.5* 05/11/2013 1510   MONOABS 1.5* 05/11/2013 1510   EOSABS 0.1 05/11/2013 1510   BASOSABS 0.0 05/11/2013 1510     BNP    Component Value Date/Time   PROBNP 125.0* 11/14/2007 0405    Lipid Panel     Component Value Date/Time   CHOL 107 08/15/2011 0555   TRIG 74 08/15/2011 0555   HDL 41 08/15/2011 0555   CHOLHDL 2.6 08/15/2011 0555   VLDL 15 08/15/2011 0555   LDLCALC 51 08/15/2011 0555     RADIOLOGY: No results found.    ASSESSMENT AND PLAN: Mr. Eljay Lave is an 80 years old , gentleman, who is 15 years status post initial intervention to his RCA. He required subsequent intervention in 2009. At last catheterization in 2013 his RCA stents were patent he had mild 30% LAD narrowing. He's not having anginal symptoms.  He has a history of hypertension and presently his blood pressure is well controlled on losartan 100 mg, metoprolol tartrate 12.5 mg twice a day.  In addition to his 12.5 mg, HCTZ.  He continues to be on dual antiplatelet therapy with aspirin and Plavix and denies any bleeding or easy bruisability.  He has not been able to take higher dose statin and for this reason is on combination Crestor 5 mg with Zetia 10 mg per lipid lowering.  He does have a history of hypothyroidism on Synthroid replacement.  He denies any recent increasing GERD symptoms and continues to take Protonix 40 mg.  He did have an episode of chest pain over the past 2 weeks.  I'm not certain if this was ischemic.  He did take 3 nitroglycerin sublingually for this.  His ECG today is unchanged.  He denies exertional symptoms.  Presently, I have renewed his  sublingual nitroglycerin.  I have recommended he continue his current medical regimen.  I will see him in 6 months for reevaluation.    Troy Sine, MD, [facility]  10/08/2013 11:08 AM

## 2013-10-14 DIAGNOSIS — J309 Allergic rhinitis, unspecified: Secondary | ICD-10-CM | POA: Diagnosis not present

## 2013-10-18 ENCOUNTER — Telehealth: Payer: Self-pay | Admitting: Cardiovascular Disease

## 2013-10-18 NOTE — Telephone Encounter (Signed)
Dale [name] is calling becaue [name] would like to get some samples of Crestor 5mg  .. Please Call  Thanks

## 2013-10-18 NOTE — Telephone Encounter (Signed)
Notified that there are no crestor samples. Advised to call back later this week.

## 2013-10-21 DIAGNOSIS — J309 Allergic rhinitis, unspecified: Secondary | ICD-10-CM | POA: Diagnosis not present

## 2013-10-25 ENCOUNTER — Telehealth: Payer: Self-pay | Admitting: Cardiovascular Disease

## 2013-10-25 MED ORDER — ROSUVASTATIN CALCIUM 5 MG PO TABS: 5.0000 mg | ORAL_TABLET | Freq: Every evening | ORAL | Status: DC

## 2013-10-25 NOTE — Telephone Encounter (Signed)
Pt would like samples of Crestor ?mg please.

## 2013-10-25 NOTE — Telephone Encounter (Signed)
Samples x 2 weeks of Crestor 10mg  - take 1/2 tablet dialy.

## 2013-10-28 ENCOUNTER — Other Ambulatory Visit: Payer: Self-pay | Admitting: Cardiovascular Disease

## 2013-10-28 DIAGNOSIS — D235 Other benign neoplasm of skin of trunk: Secondary | ICD-10-CM | POA: Diagnosis not present

## 2013-10-28 DIAGNOSIS — C4441 Basal cell carcinoma of skin of scalp and neck: Secondary | ICD-10-CM | POA: Diagnosis not present

## 2013-10-28 DIAGNOSIS — C44211 Basal cell carcinoma of skin of unspecified ear and external auricular canal: Secondary | ICD-10-CM | POA: Diagnosis not present

## 2013-10-28 DIAGNOSIS — J309 Allergic rhinitis, unspecified: Secondary | ICD-10-CM | POA: Diagnosis not present

## 2013-10-28 DIAGNOSIS — L570 Actinic keratosis: Secondary | ICD-10-CM | POA: Diagnosis not present

## 2013-10-29 ENCOUNTER — Ambulatory Visit (INDEPENDENT_AMBULATORY_CARE_PROVIDER_SITE_OTHER): Payer: Medicare Other | Admitting: Internal Medicine

## 2013-10-29 ENCOUNTER — Encounter: Payer: Self-pay | Admitting: Internal Medicine

## 2013-10-29 VITALS — BP 112/59 | HR 59 | Temp 97.0°F | Ht 68.0 in | Wt 133.4 lb

## 2013-10-29 DIAGNOSIS — I2510 Atherosclerotic heart disease of native coronary artery without angina pectoris: Secondary | ICD-10-CM

## 2013-10-29 DIAGNOSIS — K219 Gastro-esophageal reflux disease without esophagitis: Secondary | ICD-10-CM | POA: Diagnosis not present

## 2013-10-29 NOTE — Progress Notes (Signed)
Primary Care Physician:  Leonides Grills, MD Primary Gastroenterologist:  Dr. Gala Romney  Pre-Procedure History & Physical: HPI:  Dale [name] is a 80 y.o. male here for followup of GERD. Has been on Dexilant 60 mg daily with good control of his symptoms. Schatzki's ring previously dilated. Normal colonoscopy 2008. Overall, currently, doing very well. He recently got a refill of Dexilant through this office.  Past Medical History  Diagnosis Date  . Coronary heart disease     s/p stenting. cath in 01/2012 noncritical occlusion  . Idiopathic thrombocytopenic purpura 2002  . Allergic rhinitis   . Sarcoidosis     pulmonary  . Hyperlipidemia   . Hypertension   . Macular degeneration   . Hypothyroidism   . Chest pain   . GERD (gastroesophageal reflux disease)   . CHF (congestive heart failure)   . Cancer     skin  . Asthma   . Glaucoma   . Nephrolithiasis   . PUD (peptic ulcer disease)     remote  . Schatzki's ring   . Hiatal hernia   . Anal fissure     Past Surgical History  Procedure Laterality Date  . Mediastinoscopy      for dx sarcoid  . Coronary angioplasty with stent placement    . Cardiac stents    . Esophagogastroduodenoscopy  06/19/2004    Two esophageal rings and esophageal web as described above.  All of these were disrupted by passing 56-French Venia Minks dilator/ Candida esophagitis,which appears to be incidental given history of   antibiotic use, but nevertheless will be treated.  . Esophagogastroduodenoscopy  10/30/2006    Distal tandem esophageal ring status post dilation disruption as  described above.  Otherwise normal esophagus/  Small hiatal hernia otherwise normal stomach, D1 and D2  . Colonoscopy  10/30/2006    Normal rectum, sigmoid diverticula.Remainder of colonic mucosa appeared normal.  . Esophagogastroduodenoscopy (egd) with esophageal dilation  03/04/2012    RMR- schatzki's ring, hiatal hernia, polypoid gastric mucosa, bx= minimally active  gastritis.    Prior to Admission medications   Medication Sig Start Date End Date Taking? Authorizing Provider  aspirin 81 MG tablet Take 81 mg by mouth every morning.    Yes Historical Provider, MD  clopidogrel (PLAVIX) 75 MG tablet Take 1 tablet (75 mg total) by mouth daily. 03/08/13  Yes Troy Sine, MD  dexlansoprazole (DEXILANT) 60 MG capsule Take 1 capsule (60 mg total) by mouth daily. 09/28/13  Yes Mahala Menghini, PA-C  dorzolamide (TRUSOPT) 2 % ophthalmic solution Place 1 drop into both eyes 2 (two) times daily.    Yes Historical Provider, MD  ezetimibe (ZETIA) 10 MG tablet Take 1 tablet (10 mg total) by mouth daily. 05/03/13  Yes Troy Sine, MD  finasteride (PROSCAR) 5 MG tablet Take 5 mg by mouth daily.     Yes Historical Provider, MD  hydrochlorothiazide (MICROZIDE) 12.5 MG capsule Take 12.5 mg by mouth daily as needed.    Yes Historical Provider, MD  isosorbide mononitrate (IMDUR) 60 MG 24 hr tablet Take 1 tablet (60 mg total) by mouth daily. 10/05/12  Yes Troy Sine, MD  latanoprost (XALATAN) 0.005 % ophthalmic solution Place 1 drop into both eyes at bedtime.   Yes Historical Provider, MD  levothyroxine (SYNTHROID, LEVOTHROID) 100 MCG tablet Take 100 mcg by mouth daily.   Yes Historical Provider, MD  losartan (COZAAR) 100 MG tablet Take 1 tablet (100 mg total) by mouth daily. 10/05/12  Yes Troy Sine, MD  metoprolol tartrate (LOPRESSOR) 25 MG tablet Take 0.5 tablets (12.5 mg total) by mouth 2 (two) times daily. 08/02/13  Yes Troy Sine, MD  Multiple Vitamin (MULTIVITAMIN) tablet Take 1 tablet by mouth every evening.    Yes Historical Provider, MD  Multiple Vitamins-Minerals (PRESERVISION/LUTEIN) CAPS Take 1 capsule by mouth 2 (two) times daily.    Yes Historical Provider, MD  nitroGLYCERIN (NITROSTAT) 0.4 MG SL tablet Place 1 tablet (0.4 mg total) under the tongue every 5 (five) minutes as needed. For chest pain 10/08/13  Yes Troy Sine, MD  ondansetron [facility] ODT) 4  MG disintegrating tablet 4mg  ODT q4 hours prn nausea/vomit 05/11/13  Yes Mariea Clonts, MD  potassium chloride (K-DUR) 10 MEQ tablet Take 1 tablet (10 mEq total) by mouth daily. 10/16/12  Yes Troy Sine, MD  PRESCRIPTION MEDICATION Inject 1 each as directed once a week. Allergy shot= thursday   Yes Historical Provider, MD  rosuvastatin (CRESTOR) 5 MG tablet Take 1 tablet (5 mg total) by mouth every evening. 10/25/13  Yes Troy Sine, MD  timolol (TIMOPTIC) 0.5 % ophthalmic solution Place 1 drop into both eyes 2 (two) times daily.    Yes Historical Provider, MD  triamcinolone (KENALOG) 0.1 % cream Apply topically 2 (two) times daily as needed.     Yes Historical Provider, MD  vitamin C (ASCORBIC ACID) 500 MG tablet Take 500 mg by mouth every evening.    Yes Historical Provider, MD  vitamin E 200 UNIT capsule Take 200 Units by mouth every evening.    Yes Historical Provider, MD  dextromethorphan-guaiFENesin (MUCINEX DM) 30-600 MG per 12 hr tablet Take 1 tablet by mouth every morning.     Historical Provider, MD  pantoprazole (PROTONIX) 40 MG tablet Take 40 mg by mouth daily as needed (FOR GERD).    Historical Provider, MD    Allergies as of 10/29/2013 - Review Complete 10/29/2013  Allergen Reaction Noted  . Azithromycin Other (See Comments) 06/03/2013  . Doxazosin Shortness Of Breath 02/11/2012  . Acetaminophen    . Atenolol    . Hydrocodone Nausea And Vomiting 08/15/2011  . Morphine    . Penicillins Nausea And Vomiting   . Sulfonamide derivatives Nausea And Vomiting     Family History  Problem Relation Age of Onset  . Heart disease Father     deceased age 67  . Stroke Mother   . Alzheimer's disease Mother   . Heart attack Brother     deceased age 53  . Colon cancer Neg Hx   . Cancer Other     niece    History   Social History  . Marital Status: Married    Spouse Name: N/A    Number of Children: 1  . Years of Education: N/A   Occupational History  . Retired      Visual merchandiser pumping station  .     Social History Main Topics  . Smoking status: Former Smoker -- 0.10 packs/day for 2 years    Types: Cigarettes, Cigars    Quit date: 05/06/1970  . Smokeless tobacco: Never Used  . Alcohol Use: No  . Drug Use: No  . Sexual Activity: No   Other Topics Concern  . Not on file   Social History Narrative  . No narrative on file    Review of Systems: See HPI, otherwise negative ROS  Physical Exam: BP 112/59  Pulse 59  Temp(Src) 97 F (  36.1 C) (Oral)  Ht 5\' 8"  (1.727 m)  Wt 133 lb 6.4 oz (60.51 kg)  BMI 20.29 kg/m2 General:   Alert,  elderly, pleasant and cooperative in NAD Skin:  Intact without significant lesions or rashes. Eyes:  Sclera clear, no icterus.   Conjunctiva pink. Ears:  Normal auditory acuity. Nose:  No deformity, discharge,  or lesions. Mouth:  No deformity or lesions. Neck:  Supple; no masses or thyromegaly. No significant cervical adenopathy. Lungs:  Clear throughout to auscultation.   No wheezes, crackles, or rhonchi. No acute distress. Heart:  Regular rate and rhythm; no murmurs, clicks, rubs,  or gallops. Abdomen: Non-distended, normal bowel sounds.  Soft and nontender without appreciable mass or hepatosplenomegaly.  Pulses:  Normal pulses noted. Extremities:  Without clubbing or edema.  Impression/Plan:  80 year old gentleman with GERD-well-controlled. Schatzki's ring. No dysphagia since esophagus dilated previously.  Continue Dexilant 60 mg daily  GERD information provided  Office visit in 1 year     Notice: This dictation was prepared with Dragon dictation along with smaller phrase technology. Any transcriptional errors that result from this process are unintentional and may not be corrected upon review.

## 2013-10-29 NOTE — Telephone Encounter (Signed)
Rx refill sent to patient pharmacy   

## 2013-10-29 NOTE — Patient Instructions (Signed)
Continue Dexilant 60 mg daily  GERD information provided  Office visit in 1 year

## 2013-11-04 DIAGNOSIS — J309 Allergic rhinitis, unspecified: Secondary | ICD-10-CM | POA: Diagnosis not present

## 2013-11-08 DIAGNOSIS — N400 Enlarged prostate without lower urinary tract symptoms: Secondary | ICD-10-CM | POA: Diagnosis not present

## 2013-11-11 ENCOUNTER — Other Ambulatory Visit: Payer: Self-pay | Admitting: Cardiovascular Disease

## 2013-11-11 DIAGNOSIS — J309 Allergic rhinitis, unspecified: Secondary | ICD-10-CM | POA: Diagnosis not present

## 2013-11-12 NOTE — Telephone Encounter (Signed)
Rx refill sent to patient pharmacy   

## 2013-11-18 DIAGNOSIS — J309 Allergic rhinitis, unspecified: Secondary | ICD-10-CM | POA: Diagnosis not present

## 2013-11-19 DIAGNOSIS — Z961 Presence of intraocular lens: Secondary | ICD-10-CM | POA: Diagnosis not present

## 2013-11-19 DIAGNOSIS — H40059 Ocular hypertension, unspecified eye: Secondary | ICD-10-CM | POA: Diagnosis not present

## 2013-11-25 DIAGNOSIS — J309 Allergic rhinitis, unspecified: Secondary | ICD-10-CM | POA: Diagnosis not present

## 2013-12-02 ENCOUNTER — Telehealth: Payer: Self-pay | Admitting: *Deleted

## 2013-12-02 DIAGNOSIS — J309 Allergic rhinitis, unspecified: Secondary | ICD-10-CM | POA: Diagnosis not present

## 2013-12-02 MED ORDER — ROSUVASTATIN CALCIUM 5 MG PO TABS: 5.0000 mg | ORAL_TABLET | Freq: Every evening | ORAL | Status: DC

## 2013-12-02 NOTE — Telephone Encounter (Signed)
PATIENT RECEIVED SAMPLES (4) CRESTOR 5 MG

## 2013-12-09 DIAGNOSIS — J309 Allergic rhinitis, unspecified: Secondary | ICD-10-CM | POA: Diagnosis not present

## 2013-12-16 ENCOUNTER — Ambulatory Visit (INDEPENDENT_AMBULATORY_CARE_PROVIDER_SITE_OTHER): Payer: Medicare Other

## 2013-12-16 DIAGNOSIS — J309 Allergic rhinitis, unspecified: Secondary | ICD-10-CM | POA: Diagnosis not present

## 2013-12-20 ENCOUNTER — Other Ambulatory Visit: Payer: Self-pay | Admitting: Cardiovascular Disease

## 2013-12-21 NOTE — Telephone Encounter (Signed)
Rx was sent to pharmacy electronically. 

## 2013-12-23 ENCOUNTER — Telehealth: Payer: Self-pay | Admitting: Cardiovascular Disease

## 2013-12-23 MED ORDER — ROSUVASTATIN CALCIUM 5 MG PO TABS: 5.0000 mg | ORAL_TABLET | Freq: Every evening | ORAL | Status: DC

## 2013-12-23 NOTE — Telephone Encounter (Signed)
Left VM that samples are availabale for pick up

## 2013-12-23 NOTE — Telephone Encounter (Signed)
Pt need some samples of Crestor 5 mg please.

## 2013-12-30 ENCOUNTER — Other Ambulatory Visit: Payer: Self-pay

## 2013-12-30 DIAGNOSIS — J309 Allergic rhinitis, unspecified: Secondary | ICD-10-CM | POA: Diagnosis not present

## 2013-12-30 NOTE — Telephone Encounter (Signed)
Can we please find out if patient is taking both pantoprazole and Dexilant?  Both meds are on his drug list.

## 2013-12-30 NOTE — Telephone Encounter (Signed)
Pt is only taking the Protonix

## 2014-01-06 DIAGNOSIS — J309 Allergic rhinitis, unspecified: Secondary | ICD-10-CM | POA: Diagnosis not present

## 2014-01-12 MED ORDER — PANTOPRAZOLE SODIUM 40 MG PO TBEC: 40.0000 mg | DELAYED_RELEASE_TABLET | Freq: Every day | ORAL | Status: DC

## 2014-01-13 DIAGNOSIS — J309 Allergic rhinitis, unspecified: Secondary | ICD-10-CM | POA: Diagnosis not present

## 2014-01-20 DIAGNOSIS — J309 Allergic rhinitis, unspecified: Secondary | ICD-10-CM | POA: Diagnosis not present

## 2014-01-27 DIAGNOSIS — J309 Allergic rhinitis, unspecified: Secondary | ICD-10-CM | POA: Diagnosis not present

## 2014-01-31 ENCOUNTER — Telehealth: Payer: Self-pay | Admitting: Cardiovascular Disease

## 2014-01-31 MED ORDER — ROSUVASTATIN CALCIUM 5 MG PO TABS: 5.0000 mg | ORAL_TABLET | Freq: Every evening | ORAL | Status: DC

## 2014-01-31 NOTE — Telephone Encounter (Signed)
Pt would like samples of Crestor 5 mg please.

## 2014-01-31 NOTE — Telephone Encounter (Signed)
Samples left at front desk for patient pick-up.  Patient notified. 

## 2014-02-01 ENCOUNTER — Other Ambulatory Visit: Payer: Self-pay

## 2014-02-01 MED ORDER — PANTOPRAZOLE SODIUM 40 MG PO TBEC: 40.0000 mg | DELAYED_RELEASE_TABLET | Freq: Every day | ORAL | Status: DC

## 2014-02-03 DIAGNOSIS — Z85828 Personal history of other malignant neoplasm of skin: Secondary | ICD-10-CM | POA: Diagnosis not present

## 2014-02-03 DIAGNOSIS — X32XXXD Exposure to sunlight, subsequent encounter: Secondary | ICD-10-CM | POA: Diagnosis not present

## 2014-02-03 DIAGNOSIS — C44310 Basal cell carcinoma of skin of unspecified parts of face: Secondary | ICD-10-CM | POA: Diagnosis not present

## 2014-02-03 DIAGNOSIS — J309 Allergic rhinitis, unspecified: Secondary | ICD-10-CM | POA: Diagnosis not present

## 2014-02-03 DIAGNOSIS — L570 Actinic keratosis: Secondary | ICD-10-CM | POA: Diagnosis not present

## 2014-02-05 ENCOUNTER — Other Ambulatory Visit: Payer: Self-pay | Admitting: Cardiovascular Disease

## 2014-02-07 NOTE — Telephone Encounter (Signed)
Refilled #90 tablets with 1 refill on 11/12/13 (6 month supply)

## 2014-02-10 DIAGNOSIS — J309 Allergic rhinitis, unspecified: Secondary | ICD-10-CM | POA: Diagnosis not present

## 2014-02-17 ENCOUNTER — Ambulatory Visit: Payer: Medicare Other | Admitting: Internal Medicine

## 2014-02-17 DIAGNOSIS — J309 Allergic rhinitis, unspecified: Secondary | ICD-10-CM | POA: Diagnosis not present

## 2014-02-24 DIAGNOSIS — J309 Allergic rhinitis, unspecified: Secondary | ICD-10-CM | POA: Diagnosis not present

## 2014-02-24 DIAGNOSIS — Z23 Encounter for immunization: Secondary | ICD-10-CM | POA: Diagnosis not present

## 2014-02-28 ENCOUNTER — Telehealth: Payer: Self-pay | Admitting: Cardiovascular Disease

## 2014-02-28 MED ORDER — ROSUVASTATIN CALCIUM 5 MG PO TABS: 5.0000 mg | ORAL_TABLET | Freq: Every evening | ORAL | Status: DC

## 2014-02-28 NOTE — Telephone Encounter (Signed)
Pt would like samples of his Crestor 5 mg please.

## 2014-02-28 NOTE — Telephone Encounter (Signed)
Samples left at front desk for patient pick-up. Left message on machine.

## 2014-03-03 DIAGNOSIS — J309 Allergic rhinitis, unspecified: Secondary | ICD-10-CM | POA: Diagnosis not present

## 2014-03-03 DIAGNOSIS — Z23 Encounter for immunization: Secondary | ICD-10-CM | POA: Diagnosis not present

## 2014-03-10 DIAGNOSIS — J309 Allergic rhinitis, unspecified: Secondary | ICD-10-CM | POA: Diagnosis not present

## 2014-03-11 ENCOUNTER — Other Ambulatory Visit: Payer: Self-pay | Admitting: Cardiovascular Disease

## 2014-03-11 ENCOUNTER — Encounter: Payer: Self-pay | Admitting: Internal Medicine

## 2014-03-11 NOTE — Telephone Encounter (Signed)
E sent to pharmacy 

## 2014-03-17 DIAGNOSIS — J309 Allergic rhinitis, unspecified: Secondary | ICD-10-CM | POA: Diagnosis not present

## 2014-03-22 ENCOUNTER — Telehealth: Payer: Self-pay | Admitting: Cardiovascular Disease

## 2014-03-22 NOTE — Telephone Encounter (Signed)
Pt's wife called in wanting to see if pt could have  some samples for Crestor 5mg  . Please call  Thanks

## 2014-03-22 NOTE — Telephone Encounter (Signed)
NO SAMPLES CRESTOR

## 2014-03-23 ENCOUNTER — Other Ambulatory Visit: Payer: Self-pay | Admitting: *Deleted

## 2014-03-23 MED ORDER — ROSUVASTATIN CALCIUM 5 MG PO TABS: 5.0000 mg | ORAL_TABLET | Freq: Every evening | ORAL | Status: DC

## 2014-03-23 NOTE — Telephone Encounter (Signed)
Patient walked in requesting crestor 5mg  samples. Samples provided to patient.

## 2014-03-24 DIAGNOSIS — J020 Streptococcal pharyngitis: Secondary | ICD-10-CM | POA: Diagnosis not present

## 2014-03-24 DIAGNOSIS — J309 Allergic rhinitis, unspecified: Secondary | ICD-10-CM | POA: Diagnosis not present

## 2014-03-24 DIAGNOSIS — J202 Acute bronchitis due to streptococcus: Secondary | ICD-10-CM | POA: Diagnosis not present

## 2014-04-06 DIAGNOSIS — J309 Allergic rhinitis, unspecified: Secondary | ICD-10-CM | POA: Diagnosis not present

## 2014-04-07 ENCOUNTER — Encounter: Payer: Self-pay | Admitting: Cardiovascular Disease

## 2014-04-07 ENCOUNTER — Ambulatory Visit (INDEPENDENT_AMBULATORY_CARE_PROVIDER_SITE_OTHER): Payer: Medicare Other | Admitting: Cardiovascular Disease

## 2014-04-07 ENCOUNTER — Other Ambulatory Visit: Payer: Self-pay | Admitting: *Deleted

## 2014-04-07 VITALS — BP 140/60 | HR 57 | Ht 68.0 in | Wt 135.3 lb

## 2014-04-07 DIAGNOSIS — I440 Atrioventricular block, first degree: Secondary | ICD-10-CM

## 2014-04-07 DIAGNOSIS — I25118 Atherosclerotic heart disease of native coronary artery with other forms of angina pectoris: Secondary | ICD-10-CM

## 2014-04-07 DIAGNOSIS — R351 Nocturia: Secondary | ICD-10-CM | POA: Diagnosis not present

## 2014-04-07 DIAGNOSIS — E039 Hypothyroidism, unspecified: Secondary | ICD-10-CM | POA: Diagnosis not present

## 2014-04-07 DIAGNOSIS — I10 Essential (primary) hypertension: Secondary | ICD-10-CM | POA: Diagnosis not present

## 2014-04-07 DIAGNOSIS — R3912 Poor urinary stream: Secondary | ICD-10-CM | POA: Diagnosis not present

## 2014-04-07 DIAGNOSIS — I2583 Coronary atherosclerosis due to lipid rich plaque: Secondary | ICD-10-CM

## 2014-04-07 DIAGNOSIS — I2510 Atherosclerotic heart disease of native coronary artery without angina pectoris: Secondary | ICD-10-CM | POA: Diagnosis not present

## 2014-04-07 DIAGNOSIS — R310 Gross hematuria: Secondary | ICD-10-CM | POA: Diagnosis not present

## 2014-04-07 MED ORDER — NITROGLYCERIN 0.4 MG SL SUBL
0.4000 mg | SUBLINGUAL_TABLET | SUBLINGUAL | Status: DC | PRN
Start: 2014-04-07 — End: 2015-03-28

## 2014-04-07 MED ORDER — ISOSORBIDE MONONITRATE ER 60 MG PO TB24: ORAL_TABLET | ORAL | Status: DC

## 2014-04-07 NOTE — Patient Instructions (Signed)
Your physician has recommended you make the following change in your medication: increase the isosorbide to 1 tablet in the morning and 1/2 tablet at night.   Your physician recommends that you schedule a follow-up appointment in: 4-6 months.

## 2014-04-07 NOTE — Progress Notes (Signed)
Patient ID: Dale [name], male   DOB: 1928/05/09, 80 y.o.   MRN: [id number]     HPI: Dale [name] is a 80 y.o. male who presents to the office for a six month cardiology evaluation.  Dale [name] has known coronary artery disease and in 2000 underwent initial intervention to his RCA.. In 2009 a Cypher stent was placed beyond the acute margin of his RCA. His last cardiac catheterization was in September 2013 by Dr. Gwenlyn [name] which showed 30% LAD narrowing after the second diagonal vessel, and normal left circumflex coronary artery, and patent RCA stents.  Dale [name] has a history of hypothyroidism on Synthroid replacement, hyperlipidemia on Crestor 5 mg plus Zetia 10 mg, GERD, on Protonix, and he continues to be on dual antiplatelet therapy. He is responsive to Plavix on previous P2 Y12 testing.  Since I last saw him 6 months ago, he admits to experiencing 3-4 episodes of chest pain over the six-month period.  Most of these episodes have occurred at night.  However, they have been nitrate responsive.  He states that they were similar to his previous discomfort.  He has not been very active.  His last episode was a proximally 6 weeks ago.   He denies PND or orthopnea.  There are no palpitations.  He denies myalgias.  He denies presyncope or syncope.  Past Medical History  Diagnosis Date  . Coronary heart disease     s/p stenting. cath in 01/2012 noncritical occlusion  . Idiopathic thrombocytopenic purpura 2002  . Allergic rhinitis   . Sarcoidosis     pulmonary  . Hyperlipidemia   . Hypertension   . Macular degeneration   . Hypothyroidism   . Chest pain   . GERD (gastroesophageal reflux disease)   . CHF (congestive heart failure)   . Cancer     skin  . Asthma   . Glaucoma   . Nephrolithiasis   . PUD (peptic ulcer disease)     remote  . Schatzki's ring   . Hiatal hernia   . Anal fissure     Past Surgical History  Procedure Laterality Date  . Mediastinoscopy      for dx sarcoid  .  Coronary angioplasty with stent placement    . Cardiac stents    . Esophagogastroduodenoscopy  06/19/2004    Two esophageal rings and esophageal web as described above.  All of these were disrupted by passing 56-French Venia Minks dilator/ Candida esophagitis,which appears to be incidental given history of   antibiotic use, but nevertheless will be treated.  . Esophagogastroduodenoscopy  10/30/2006    Distal tandem esophageal ring status post dilation disruption as  described above.  Otherwise normal esophagus/  Small hiatal hernia otherwise normal stomach, D1 and D2  . Colonoscopy  10/30/2006    Normal rectum, sigmoid diverticula.Remainder of colonic mucosa appeared normal.  . Esophagogastroduodenoscopy (egd) with esophageal dilation  03/04/2012    RMR- schatzki's ring, hiatal hernia, polypoid gastric mucosa, bx= minimally active gastritis.    Allergies  Allergen Reactions  . Azithromycin Other (See Comments)    Sore mouth and fever blisters around mouth, sores in nose area as well  . Doxazosin Shortness Of Breath  . Acetaminophen     REACTION: UNKNOWN REACTION  . Atenolol     REACTION: UNKNOWN REACTION  . Hydrocodone Nausea And Vomiting  . Morphine     "made me crazy"  . Penicillins Nausea And Vomiting  . Sulfonamide Derivatives Nausea And Vomiting  Current Outpatient Prescriptions  Medication Sig Dispense Refill  . aspirin 81 MG tablet Take 81 mg by mouth every morning.     . clopidogrel (PLAVIX) 75 MG tablet Take 1 tablet by mouth  daily 90 tablet 3  . dorzolamide (TRUSOPT) 2 % ophthalmic solution Place 1 drop into both eyes 2 (two) times daily.     Marland Kitchen ezetimibe (ZETIA) 10 MG tablet Take 1 tablet (10 mg total) by mouth daily. 90 tablet 3  . guaiFENesin (MUCINEX) 600 MG 12 hr tablet Take 1,200 mg by mouth 2 (two) times daily.    . hydrochlorothiazide (HYDRODIURIL) 12.5 MG tablet Take 1 tablet by mouth once a day as needed for fluid  retention 90 tablet 1  . hydrochlorothiazide  (MICROZIDE) 12.5 MG capsule Take 12.5 mg by mouth daily as needed.     . isosorbide mononitrate (IMDUR) 60 MG 24 hr tablet Take 1 tablet by mouth  daily 90 tablet 1  . latanoprost (XALATAN) 0.005 % ophthalmic solution Place 1 drop into both eyes at bedtime.    Marland Kitchen levothyroxine (SYNTHROID, LEVOTHROID) 100 MCG tablet Take 100 mcg by mouth daily.    Marland Kitchen losartan (COZAAR) 100 MG tablet Take 1 tablet (100 mg  total) by mouth daily 90 tablet 1  . metoprolol tartrate (LOPRESSOR) 25 MG tablet Take 0.5 tablets (12.5 mg total) by mouth 2 (two) times daily. 90 tablet 1  . Multiple Vitamin (MULTIVITAMIN) tablet Take 1 tablet by mouth every evening.     . Multiple Vitamins-Minerals (PRESERVISION/LUTEIN) CAPS Take 1 capsule by mouth 2 (two) times daily.     . nitroGLYCERIN (NITROSTAT) 0.4 MG SL tablet Place 1 tablet (0.4 mg total) under the tongue every 5 (five) minutes as needed. For chest pain 25 tablet 2  . ondansetron (ZOFRAN ODT) 4 MG disintegrating tablet 4mg  ODT q4 hours prn nausea/vomit 4 tablet 0  . pantoprazole (PROTONIX) 40 MG tablet Take 1 tablet (40 mg total) by mouth daily. 30 tablet 5  . potassium chloride (K-DUR,KLOR-CON) 10 MEQ tablet Take 1 tablet (10 mEq total) by mouth daily. 90 tablet 3  . PRESCRIPTION MEDICATION Inject 1 each as directed once a week. Allergy shot= thursday    . rosuvastatin (CRESTOR) 5 MG tablet Take 1 tablet (5 mg total) by mouth every evening. 21 tablet 0  . timolol (TIMOPTIC) 0.5 % ophthalmic solution Place 1 drop into both eyes 2 (two) times daily.     Marland Kitchen triamcinolone (KENALOG) 0.1 % cream Apply topically 2 (two) times daily as needed.      . vitamin C (ASCORBIC ACID) 500 MG tablet Take 500 mg by mouth every evening.     . vitamin E 200 UNIT capsule Take 200 Units by mouth every evening.     . [DISCONTINUED] potassium chloride (K-DUR) 10 MEQ tablet Take 1 tablet (10 mEq total) by mouth daily. 90 tablet 3   No current facility-administered medications for this visit.     History   Social History  . Marital Status: Married    Spouse Name: N/A    Number of Children: 1  . Years of Education: N/A   Occupational History  . Retired     Visual merchandiser pumping station  .     Social History Main Topics  . Smoking status: Former Smoker -- 0.10 packs/day for 2 years    Types: Cigarettes, Cigars    Quit date: 05/06/1970  . Smokeless tobacco: Never Used  . Alcohol Use: No  . Drug  Use: No  . Sexual Activity: No   Other Topics Concern  . Not on file   Social History Narrative   Additional social history is notable that he is married and lives with his wife. He quit smoking over 40 years ago. Has one child. He remains active.  Family History  Problem Relation Age of Onset  . Heart disease Father     deceased age 76  . Stroke Mother   . Alzheimer's disease Mother   . Heart attack Brother     deceased age 72  . Colon cancer Neg Hx   . Cancer Other     niece   ROS General: Negative; No fevers, chills, or night sweats;  HEENT: Negative; No changes in vision or hearing, sinus congestion, difficulty swallowing Pulmonary: Negative; No cough, wheezing, shortness of breath, hemoptysis Cardiovascular: See history of present illness, presyncope, syncope, palpatations GI: Negative; No nausea, vomiting, diarrhea, or abdominal pain GU: Negative; No dysuria, hematuria, or difficulty voiding Musculoskeletal: Negative; no myalgias, joint pain, or weakness Hematologic/Oncology: Negative; no easy bruising, bleeding Endocrine: Positive for hypothyroidism; no diabetes Neuro: Negative; no changes in balance, headaches Skin: Negative; No rashes or skin lesions Psychiatric: Negative; No behavioral problems, depression Sleep: Negative; No snoring, daytime sleepiness, hypersomnolence, bruxism, restless legs, hypnogognic hallucinations, no cataplexy Other comprehensive 14 point system review is negative.   PE BP 140/60 mmHg  Pulse 57  Ht 5\' 8"  (1.727 m)  Wt 135  lb 4.8 oz (61.372 kg)  BMI 20.58 kg/m2  General: Alert, oriented, no distress.  Skin: normal turgor, no rashes HEENT: Normocephalic, atraumatic. Pupils round and reactive; sclera anicteric;no lid lag.  Nose without nasal septal hypertrophy Mouth/Parynx benign; Mallinpatti scale 2 Neck: No JVD, no carotid bruits with normal carotid upstroke Lungs: clear to ausculatation and percussion; no wheezing or rales Chest wall: Nontender to palpation Heart: RRR, s1 s2 normal 1/6 systolic murmur, unchanged. No S3 gallop no diastolic murmur, no rubs thrills or heaves Abdomen: soft, nontender; no hepatosplenomehaly, BS+; abdominal aorta nontender and not dilated by palpation. Back: No CVA tenderness Pulses 2+ Extremities: no clubbing cyanosis or edema, Homan's sign negative  Neurologic: grossly nonfocal Psychologic: normal affect and mood.  ECG (independently read by me): Sinus bradycardia 57 bpm.  Borderline first-degree AV block with a PR interval 216 daily.  Seconds.  QTc interval is normal at 373 ms.  June 2015 ECG (independently read by me): Sinus bradycardia 54 beats per minute.  First degree block with a PR interval of 232 ms.  No significant ST-T changes.  Prior ECG: Sinus bradycardia 54 beats per minute. PR interval 206 ms. QTc interval normal  LABS:  BMET    Component Value Date/Time   NA 136* 05/11/2013 1511   K 3.9 05/11/2013 1511   CL 99 05/11/2013 1511   CO2 27 05/11/2013 1511   GLUCOSE 127* 05/11/2013 1511   BUN 14 05/11/2013 1511   CREATININE 0.76 05/11/2013 1511   CALCIUM 9.2 05/11/2013 1511   GFRNONAA 83* 05/11/2013 1511   GFRAA >90 05/11/2013 1511     Hepatic Function Panel     Component Value Date/Time   PROT 6.7 05/11/2013 1511   ALBUMIN 3.6 05/11/2013 1511   AST 19 05/11/2013 1511   ALT 26 05/11/2013 1511   ALKPHOS 74 05/11/2013 1511   BILITOT 0.4 05/11/2013 1511   BILIDIR 0.1 11/17/2007 1400   IBILI 0.5 11/17/2007 1400     CBC    Component Value  Date/Time  WBC 7.3 05/11/2013 1510   RBC 3.76* 05/11/2013 1510   HGB 12.8* 05/11/2013 1510   HCT 38.2* 05/11/2013 1510   PLT 157 05/11/2013 1510   MCV 101.6* 05/11/2013 1510   MCH 34.0 05/11/2013 1510   MCHC 33.5 05/11/2013 1510   RDW 13.9 05/11/2013 1510   LYMPHSABS 0.5* 05/11/2013 1510   MONOABS 1.5* 05/11/2013 1510   EOSABS 0.1 05/11/2013 1510   BASOSABS 0.0 05/11/2013 1510     BNP    Component Value Date/Time   PROBNP 125.0* 11/14/2007 0405    Lipid Panel     Component Value Date/Time   CHOL 107 08/15/2011 0555   TRIG 74 08/15/2011 0555   HDL 41 08/15/2011 0555   CHOLHDL 2.6 08/15/2011 0555   VLDL 15 08/15/2011 0555   LDLCALC 51 08/15/2011 0555     RADIOLOGY: No results [name].    ASSESSMENT AND PLAN: Mr. Cipriano Millikan is an 80 years old gentleman who is 15 years status post initial intervention to his RCA. He required subsequent intervention in 2009. At last catheterization in 2013 his RCA stents were patent he had mild 30% LAD narrowing.  He has experienced 3-4 episodes of chest pain over the past 6 months.  He feels at this chest pain has similar similar character and quality as his previous discomfort.  He denies any definitive exertional precipitation.  He has been taking isosorbide, mononitrate 60 mg in the morning, losartan 100 mg daily, metoprolol, tartrate 12.5 mg twice a day in addition to HCTZ, both for blood pressure control as well as his coronary obstructive disease.  I am recommending that we change the isosorbide mononitrate to 60 g in the morning and he will also take a 30 mg at night.  He is bradycardic today and I will not increase his beta blocker therapy.  He is on Zetia 10 mg and Crestor 5 mg for hyperlipidemia.  He's not having GERD symptoms on Protonix 40 mg daily.  He is on Synthroid 100 g for thyroid replacement for his hypothyroidism.  He denies any bleeding issues on his dual antiplatelet therapy with aspirin/Plavix.  There are no signs of  edema.  He will contact our office if he develops increasing recurrent chest pain symptomatology.  Otherwise, I will see him in 4-6 months for follow-up evaluation.  Time spent: 25 minutes  Troy Sine, MD, [facility]  04/07/2014 11:18 AM

## 2014-04-11 ENCOUNTER — Other Ambulatory Visit: Payer: Self-pay | Admitting: Gastroenterology

## 2014-04-11 ENCOUNTER — Other Ambulatory Visit: Payer: Self-pay | Admitting: Cardiovascular Disease

## 2014-04-12 DIAGNOSIS — N2889 Other specified disorders of kidney and ureter: Secondary | ICD-10-CM | POA: Diagnosis not present

## 2014-04-12 DIAGNOSIS — R310 Gross hematuria: Secondary | ICD-10-CM | POA: Diagnosis not present

## 2014-04-12 DIAGNOSIS — R351 Nocturia: Secondary | ICD-10-CM | POA: Diagnosis not present

## 2014-04-12 DIAGNOSIS — K5900 Constipation, unspecified: Secondary | ICD-10-CM | POA: Diagnosis not present

## 2014-04-12 NOTE — Telephone Encounter (Signed)
Rx refill sent to patient pharmacy   

## 2014-04-14 ENCOUNTER — Encounter (HOSPITAL_COMMUNITY): Payer: Self-pay | Admitting: Cardiovascular Disease

## 2014-04-14 DIAGNOSIS — Z08 Encounter for follow-up examination after completed treatment for malignant neoplasm: Secondary | ICD-10-CM | POA: Diagnosis not present

## 2014-04-14 DIAGNOSIS — X32XXXD Exposure to sunlight, subsequent encounter: Secondary | ICD-10-CM | POA: Diagnosis not present

## 2014-04-14 DIAGNOSIS — J309 Allergic rhinitis, unspecified: Secondary | ICD-10-CM | POA: Diagnosis not present

## 2014-04-14 DIAGNOSIS — C44310 Basal cell carcinoma of skin of unspecified parts of face: Secondary | ICD-10-CM | POA: Diagnosis not present

## 2014-04-14 DIAGNOSIS — L570 Actinic keratosis: Secondary | ICD-10-CM | POA: Diagnosis not present

## 2014-04-14 DIAGNOSIS — Z85828 Personal history of other malignant neoplasm of skin: Secondary | ICD-10-CM | POA: Diagnosis not present

## 2014-04-15 ENCOUNTER — Other Ambulatory Visit: Payer: Self-pay | Admitting: *Deleted

## 2014-04-15 MED ORDER — ISOSORBIDE MONONITRATE ER 60 MG PO TB24: ORAL_TABLET | ORAL | Status: DC

## 2014-04-20 ENCOUNTER — Ambulatory Visit (INDEPENDENT_AMBULATORY_CARE_PROVIDER_SITE_OTHER): Payer: Medicare Other

## 2014-04-20 DIAGNOSIS — J309 Allergic rhinitis, unspecified: Secondary | ICD-10-CM

## 2014-04-21 DIAGNOSIS — J309 Allergic rhinitis, unspecified: Secondary | ICD-10-CM | POA: Diagnosis not present

## 2014-04-27 DIAGNOSIS — J309 Allergic rhinitis, unspecified: Secondary | ICD-10-CM | POA: Diagnosis not present

## 2014-05-03 DIAGNOSIS — H35053 Retinal neovascularization, unspecified, bilateral: Secondary | ICD-10-CM | POA: Diagnosis not present

## 2014-05-03 DIAGNOSIS — H3530 Unspecified macular degeneration: Secondary | ICD-10-CM | POA: Diagnosis not present

## 2014-05-04 DIAGNOSIS — J309 Allergic rhinitis, unspecified: Secondary | ICD-10-CM | POA: Diagnosis not present

## 2014-05-12 DIAGNOSIS — J309 Allergic rhinitis, unspecified: Secondary | ICD-10-CM | POA: Diagnosis not present

## 2014-05-16 ENCOUNTER — Telehealth: Payer: Self-pay | Admitting: Cardiovascular Disease

## 2014-05-16 NOTE — Telephone Encounter (Signed)
Spoke with pt, aware samples at the front desk for pick up 

## 2014-05-16 NOTE — Telephone Encounter (Signed)
Pt's wife called in wanting to know if samples for Crestor 5 mg were available. Please call  Thanks

## 2014-05-17 DIAGNOSIS — N400 Enlarged prostate without lower urinary tract symptoms: Secondary | ICD-10-CM | POA: Diagnosis not present

## 2014-05-17 DIAGNOSIS — D495 Neoplasm of unspecified behavior of other genitourinary organs: Secondary | ICD-10-CM | POA: Diagnosis not present

## 2014-05-17 DIAGNOSIS — R310 Gross hematuria: Secondary | ICD-10-CM | POA: Diagnosis not present

## 2014-05-18 ENCOUNTER — Telehealth: Payer: Self-pay | Admitting: Cardiovascular Disease

## 2014-05-18 ENCOUNTER — Other Ambulatory Visit: Payer: Self-pay | Admitting: Cardiovascular Disease

## 2014-05-18 NOTE — Telephone Encounter (Signed)
Pt would like to take the generic for Zetia please.Please call and let them know. If so please call or send to [facility] a month supply and refills.

## 2014-05-18 NOTE — Telephone Encounter (Signed)
Called, no answer.

## 2014-05-18 NOTE — Telephone Encounter (Signed)
Rx(s) sent to pharmacy electronically.  

## 2014-05-18 NOTE — Telephone Encounter (Signed)
No generic available. Can dc zetia but will need to increase crestor dose to 20 mg.

## 2014-05-18 NOTE — Telephone Encounter (Signed)
Pt would prefer an alternative to Zetia due to cost & no generic available. Can we recommend?

## 2014-05-19 DIAGNOSIS — J309 Allergic rhinitis, unspecified: Secondary | ICD-10-CM | POA: Diagnosis not present

## 2014-05-19 MED ORDER — ROSUVASTATIN CALCIUM 20 MG PO TABS: 20.0000 mg | ORAL_TABLET | Freq: Every day | ORAL | Status: DC

## 2014-05-19 NOTE — Telephone Encounter (Signed)
Pt tentatively would switch back, depends on price increase for the crestor. Instructed him that I sent in refill, he will call [facility] to verify pricing and call us back if he would rather remain on the Zetia.

## 2014-05-26 DIAGNOSIS — J309 Allergic rhinitis, unspecified: Secondary | ICD-10-CM | POA: Diagnosis not present

## 2014-05-26 DIAGNOSIS — H40053 Ocular hypertension, bilateral: Secondary | ICD-10-CM | POA: Diagnosis not present

## 2014-05-26 DIAGNOSIS — Z961 Presence of intraocular lens: Secondary | ICD-10-CM | POA: Diagnosis not present

## 2014-05-26 DIAGNOSIS — H3531 Nonexudative age-related macular degeneration: Secondary | ICD-10-CM | POA: Diagnosis not present

## 2014-06-02 DIAGNOSIS — J309 Allergic rhinitis, unspecified: Secondary | ICD-10-CM | POA: Diagnosis not present

## 2014-06-09 DIAGNOSIS — J309 Allergic rhinitis, unspecified: Secondary | ICD-10-CM | POA: Diagnosis not present

## 2014-06-16 DIAGNOSIS — J309 Allergic rhinitis, unspecified: Secondary | ICD-10-CM | POA: Diagnosis not present

## 2014-06-23 DIAGNOSIS — J309 Allergic rhinitis, unspecified: Secondary | ICD-10-CM | POA: Diagnosis not present

## 2014-06-24 ENCOUNTER — Other Ambulatory Visit: Payer: Self-pay | Admitting: Cardiovascular Disease

## 2014-06-24 NOTE — Telephone Encounter (Signed)
Rx refill sent to patient pharmacy   

## 2014-06-28 ENCOUNTER — Ambulatory Visit (INDEPENDENT_AMBULATORY_CARE_PROVIDER_SITE_OTHER): Payer: Medicare Other | Admitting: Cardiovascular Disease

## 2014-06-28 ENCOUNTER — Encounter: Payer: Self-pay | Admitting: Cardiovascular Disease

## 2014-06-28 ENCOUNTER — Telehealth: Payer: Self-pay | Admitting: Physician Assistant

## 2014-06-28 VITALS — BP 172/80 | HR 65 | Ht 68.0 in | Wt 143.5 lb

## 2014-06-28 DIAGNOSIS — E785 Hyperlipidemia, unspecified: Secondary | ICD-10-CM | POA: Diagnosis not present

## 2014-06-28 DIAGNOSIS — I10 Essential (primary) hypertension: Secondary | ICD-10-CM

## 2014-06-28 DIAGNOSIS — I2510 Atherosclerotic heart disease of native coronary artery without angina pectoris: Secondary | ICD-10-CM | POA: Diagnosis not present

## 2014-06-28 DIAGNOSIS — I440 Atrioventricular block, first degree: Secondary | ICD-10-CM

## 2014-06-28 DIAGNOSIS — E782 Mixed hyperlipidemia: Secondary | ICD-10-CM | POA: Insufficient documentation

## 2014-06-28 DIAGNOSIS — E7800 Pure hypercholesterolemia, unspecified: Secondary | ICD-10-CM

## 2014-06-28 DIAGNOSIS — E039 Hypothyroidism, unspecified: Secondary | ICD-10-CM | POA: Diagnosis not present

## 2014-06-28 MED ORDER — AMLODIPINE BESYLATE 5 MG PO TABS: 5.0000 mg | ORAL_TABLET | Freq: Every day | ORAL | Status: DC

## 2014-06-28 NOTE — Progress Notes (Signed)
Patient ID: Dale [name], male   DOB: [birth year], 81 y.o.   MRN: [id number]     HPI: Dale [name] is a 81 y.o. male who presents to the office for a 3 month cardiology evaluation.  Dale [name] has known CAD and underwent initial intervention to his RCA in 2000. In 2009 a Cypher stent was placed beyond the acute margin of his RCA. His last cardiac catheterization was in September 2013 by Dr. [name] which showed 30% LAD narrowing after the second diagonal vessel, and normal left circumflex coronary artery, and patent RCA stents.  Dale [name] has a history of hypothyroidism on Synthroid replacement.  He has a history of hyperlipidemia and had been on on Crestor 5 mg plus Zetia 10 mg; however, due to recent increased cost of Zetia, this was discontinued and he has been on Crestor 20 mg daily for the last month.  He also has a history of GERD treated with Protonix, and he continues to be on dual antiplatelet therapy. He is responsive to Plavix on previous P2 Y12 testing.  When I last saw him 3 months ago he stated that he had experienced 3-4 episodes of chest pain over the six-month period.  Most of these episodes have occurred at night.  However, they have been nitrate responsive.  He states that they were similar to his previous discomfort.  When I saw him, I further titrated his isosorbide mononitrate to 60 mg in the morning and 30 mg at night.  He denies any definitive exertional precipitation of chest pain.  But at times he does admit to some chest wall-like discomfort, particularly when he loses arms up over his head.  He has a history of hypertension and recently has been maintained on losartan 100 mg, metoprolol, tartrate 12.5 mg twice a day, and HCTZ 12.5 mg.  He states since I have seen him.  He is blood pressure has been checked on numerous occasions and has been consistently elevated.  He presents for evaluation.  Past Medical History  Diagnosis Date  . Coronary heart disease     s/p stenting.  cath in 01/2012 noncritical occlusion  . Idiopathic thrombocytopenic purpura 2002  . Allergic rhinitis   . Sarcoidosis     pulmonary  . Hyperlipidemia   . Hypertension   . Macular degeneration   . Hypothyroidism   . Chest pain   . GERD (gastroesophageal reflux disease)   . CHF (congestive heart failure)   . Cancer     skin  . Asthma   . Glaucoma   . Nephrolithiasis   . PUD (peptic ulcer disease)     remote  . Schatzki's ring   . Hiatal hernia   . Anal fissure     Past Surgical History  Procedure Laterality Date  . Mediastinoscopy      for dx sarcoid  . Coronary angioplasty with stent placement    . Cardiac stents    . Esophagogastroduodenoscopy  06/19/2004    Two esophageal rings and esophageal web as described above.  All of these were disrupted by passing 56-French Venia Minks dilator/ Candida esophagitis,which appears to be incidental given history of   antibiotic use, but nevertheless will be treated.  . Esophagogastroduodenoscopy  10/30/2006    Distal tandem esophageal ring status post dilation disruption as  described above.  Otherwise normal esophagus/  Small hiatal hernia otherwise normal stomach, D1 and D2  . Colonoscopy  10/30/2006    Normal rectum, sigmoid diverticula.Remainder of colonic mucosa  appeared normal.  . Esophagogastroduodenoscopy (egd) with esophageal dilation  03/04/2012    RMR- schatzki's ring, hiatal hernia, polypoid gastric mucosa, bx= minimally active gastritis.  . Left heart cath N/A 02/02/2012    Procedure: LEFT HEART CATH;  Surgeon: Lorretta Harp, MD;  [facility];  Service: Cardiovascular;  Laterality: N/A;    Allergies  Allergen Reactions  . Azithromycin Other (See Comments)    Sore mouth and fever blisters around mouth, sores in nose area as well  . Doxazosin Shortness Of Breath  . Acetaminophen     REACTION: UNKNOWN REACTION  . Atenolol     REACTION: UNKNOWN REACTION  . Hydrocodone Nausea And Vomiting  . Morphine     "made  me crazy"  . Penicillins Nausea And Vomiting  . Sulfonamide Derivatives Nausea And Vomiting    Current Outpatient Prescriptions  Medication Sig Dispense Refill  . aspirin 81 MG tablet Take 81 mg by mouth every morning.     . clopidogrel (PLAVIX) 75 MG tablet Take 1 tablet by mouth  daily 90 tablet 3  . dorzolamide (TRUSOPT) 2 % ophthalmic solution Place 1 drop into both eyes 2 (two) times daily.     Marland Kitchen guaiFENesin (MUCINEX) 600 MG 12 hr tablet Take 1,200 mg by mouth 2 (two) times daily.    . hydrochlorothiazide (HYDRODIURIL) 12.5 MG tablet Take 1 tablet by mouth once a day as needed for fluid   retention 90 tablet 2  . isosorbide mononitrate (IMDUR) 60 MG 24 hr tablet pt take 60 mg in morning and 30 mg at night 135 tablet 3  . latanoprost (XALATAN) 0.005 % ophthalmic solution Place 1 drop into both eyes at bedtime.    Marland Kitchen levothyroxine (SYNTHROID, LEVOTHROID) 100 MCG tablet Take 100 mcg by mouth daily.    Marland Kitchen losartan (COZAAR) 100 MG tablet Take 1 tablet by mouth  daily 90 tablet 3  . metoprolol tartrate (LOPRESSOR) 25 MG tablet Take one-half tablet by  mouth two times daily 90 tablet 3  . Multiple Vitamin (MULTIVITAMIN) tablet Take 1 tablet by mouth every evening.     . Multiple Vitamins-Minerals (PRESERVISION/LUTEIN) CAPS Take 1 capsule by mouth 2 (two) times daily.     . nitroGLYCERIN (NITROSTAT) 0.4 MG SL tablet Place 1 tablet (0.4 mg total) under the tongue every 5 (five) minutes as needed. For chest pain 25 tablet 2  . ondansetron (ZOFRAN ODT) 4 MG disintegrating tablet 4mg  ODT q4 hours prn nausea/vomit 4 tablet 0  . pantoprazole (PROTONIX) 40 MG tablet Take 1 tablet by mouth  daily 90 tablet 3  . potassium chloride (K-DUR,KLOR-CON) 10 MEQ tablet Take 1 tablet (10 mEq total) by mouth daily. 90 tablet 3  . PRESCRIPTION MEDICATION Inject 1 each as directed once a week. Allergy shot= thursday    . rosuvastatin (CRESTOR) 20 MG tablet Take 1 tablet (20 mg total) by mouth daily. 90 tablet 3  .  timolol (TIMOPTIC) 0.5 % ophthalmic solution Place 1 drop into both eyes 2 (two) times daily.     Marland Kitchen triamcinolone (KENALOG) 0.1 % cream Apply topically 2 (two) times daily as needed.      . vitamin C (ASCORBIC ACID) 500 MG tablet Take 500 mg by mouth every evening.     . vitamin E 200 UNIT capsule Take 200 Units by mouth every evening.     . [DISCONTINUED] potassium chloride (K-DUR) 10 MEQ tablet Take 1 tablet (10 mEq total) by mouth daily. 90 tablet 3  No current facility-administered medications for this visit.    History   Social History  . Marital Status: Married    Spouse Name: N/A  . Number of Children: 1  . Years of Education: N/A   Occupational History  . Retired     Visual merchandiser pumping station  .     Social History Main Topics  . Smoking status: Former Smoker -- 0.10 packs/day for 2 years    Types: Cigarettes, Cigars    Quit date: 05/06/1970  . Smokeless tobacco: Never Used  . Alcohol Use: No  . Drug Use: No  . Sexual Activity: No   Other Topics Concern  . Not on file   Social History Narrative   Additional social history is notable that he is married and lives with his wife. He quit smoking over 40 years ago. Has one child. He remains active.  Family History  Problem Relation Age of Onset  . Heart disease Father     deceased age 5  . Stroke Mother   . Alzheimer's disease Mother   . Heart attack Brother     deceased age 78  . Colon cancer Neg Hx   . Cancer Other     niece   ROS General: Negative; No fevers, chills, or night sweats;  HEENT: Negative; No changes in vision or hearing, sinus congestion, difficulty swallowing Pulmonary: Negative; No cough, wheezing, shortness of breath, hemoptysis Cardiovascular: See history of present illness, presyncope, syncope, palpatations GI: Negative; No nausea, vomiting, diarrhea, or abdominal pain GU: Negative; No dysuria, hematuria, or difficulty voiding Musculoskeletal: Negative; no myalgias, joint pain, or  weakness Hematologic/Oncology: Negative; no easy bruising, bleeding Endocrine: Positive for hypothyroidism; no diabetes Neuro: Negative; no changes in balance, headaches Skin: Negative; No rashes or skin lesions Psychiatric: Negative; No behavioral problems, depression Sleep: Negative; No snoring, daytime sleepiness, hypersomnolence, bruxism, restless legs, hypnogognic hallucinations, no cataplexy Other comprehensive 14 point system review is negative.   PE BP 172/80 mmHg  Pulse 65  Ht 5\' 8"  (1.727 m)  Wt 143 lb 8 oz (65.091 kg)  BMI 21.82 kg/m2  General: Alert, oriented, no distress.  Skin: normal turgor, no rashes HEENT: Normocephalic, atraumatic. Pupils round and reactive; sclera anicteric;no lid lag.  Nose without nasal septal hypertrophy Mouth/Parynx benign; Mallinpatti scale 2 Neck: No JVD, no carotid bruits with normal carotid upstroke Lungs: clear to ausculatation and percussion; no wheezing or rales Chest wall: Nontender to palpation; specifically, no costochondral tenderness. Heart: RRR, s1 s2 normal 1/6 systolic murmur, unchanged. No S3 gallop no diastolic murmur, no rubs thrills or heaves Abdomen: soft, nontender; no hepatosplenomehaly, BS+; abdominal aorta nontender and not dilated by palpation. Back: No CVA tenderness Pulses 2+ Extremities: no clubbing cyanosis or edema, Homan's sign negative  Neurologic: grossly nonfocal Psychologic: normal affect and mood.  ECG per disease independently read by me, and (: Sinus rhythm at 65 bpm.  Mild first-degree AV block with a PR interval 228 ms.  No significant ST segment changes.  December 2015 ECG (independently read by me): Sinus bradycardia 57 bpm.  Borderline first-degree AV block with a PR interval 216 daily.  Seconds.  QTc interval is normal at 373 ms.  June 2015 ECG (independently read by me): Sinus bradycardia 54 beats per minute.  First degree block with a PR interval of 232 ms.  No significant ST-T changes.  Prior  ECG: Sinus bradycardia 54 beats per minute. PR interval 206 ms. QTc interval normal  LABS:  BMET  Component Value Date/Time   NA 136* 05/11/2013 1511   K 3.9 05/11/2013 1511   CL 99 05/11/2013 1511   CO2 27 05/11/2013 1511   GLUCOSE 127* 05/11/2013 1511   BUN 14 05/11/2013 1511   CREATININE 0.76 05/11/2013 1511   CALCIUM 9.2 05/11/2013 1511   GFRNONAA 83* 05/11/2013 1511   GFRAA >90 05/11/2013 1511     Hepatic Function Panel     Component Value Date/Time   PROT 6.7 05/11/2013 1511   ALBUMIN 3.6 05/11/2013 1511   AST 19 05/11/2013 1511   ALT 26 05/11/2013 1511   ALKPHOS 74 05/11/2013 1511   BILITOT 0.4 05/11/2013 1511   BILIDIR 0.1 11/17/2007 1400   IBILI 0.5 11/17/2007 1400     CBC    Component Value Date/Time   WBC 7.3 05/11/2013 1510   RBC 3.76* 05/11/2013 1510   HGB 12.8* 05/11/2013 1510   HCT 38.2* 05/11/2013 1510   PLT 157 05/11/2013 1510   MCV 101.6* 05/11/2013 1510   MCH 34.0 05/11/2013 1510   MCHC 33.5 05/11/2013 1510   RDW 13.9 05/11/2013 1510   LYMPHSABS 0.5* 05/11/2013 1510   MONOABS 1.5* 05/11/2013 1510   EOSABS 0.1 05/11/2013 1510   BASOSABS 0.0 05/11/2013 1510     BNP    Component Value Date/Time   PROBNP 125.0* 11/14/2007 0405    Lipid Panel     Component Value Date/Time   CHOL 107 08/15/2011 0555   TRIG 74 08/15/2011 0555   HDL 41 08/15/2011 0555   CHOLHDL 2.6 08/15/2011 0555   VLDL 15 08/15/2011 0555   LDLCALC 51 08/15/2011 0555     RADIOLOGY: No results found.    ASSESSMENT AND PLAN: [name] is an 81 years old gentleman who is 16 years status post initial intervention to his RCA. He required subsequent intervention in 2009. At last catheterization in 2013 his RCA stents were patent he had mild 30% LAD narrowing.  Since I last saw him, he has been on increased dose of isosorbide mononitrate and seems to tolerate this well.  He has a history of hypertension and his blood pressure today is elevated despite taking  HCTZ 12.5 mg, losartan 100 mg, and metoprolol 12.5 mg twice a day.  I'm adding amlodipine 5 mg to his medical regimen, which will be helpful both for blood pressure control and potential antianginal benefit.  He has a history of hyperlipidemia.  Due to expense.  He no longer is taking Zetia but now is taking an increased dose of Crestor 20 mg, which he appears to be tolerating well.  Let work will be obtained in the fasting state to assess efficacy and make certain he is tolerating this.  I suspect he has some noncardiac chest pain associated with lifting his arms over his head.  He continues to be on antiplatelet therapy and is doing well without bleeding.  He has GERD for which he takes Protonix.  He is history of hypothyroidism on Synthroid replacement.  I will contact him regarding his fasting laboratory results and adjustments will be made to his medical regimen.  I will see him in 3 months for reevaluation or sooner from his arise.   Time spent: 25 minutes  Troy Sine, MD, [facility]  06/28/2014 10:06 AM

## 2014-06-28 NOTE — Patient Instructions (Addendum)
Your physician recommends that you return for lab work in: 1 week. This will need to be fasting. Nothing to eat or drink after midnight.  Your physician has recommended you make the following change in your medication: start new prescription for amlodipine 5 mg. This has already been sent to your [facility] pharmacy.  Your physician recommends that you schedule a follow-up appointment in: 3 months with [name].

## 2014-06-28 NOTE — Telephone Encounter (Signed)
Patient called answering service with question about amlodipine. This was prescribed at [facility] today by [name]. He states he went to pick this up at [facility] and they told him his insurance denied it because the insurance had on file that he'd filled this medicine within the last 3 months with a Investment banker, corporate. The patient is completely unaware of this and adamantly reports that he's never taken amlodipine/Norvasc. I called [facility] to clarify and they corroborated the above. They recommended he contact his insurance company to find out why they have this on file for him. [facility] does not have any record of filling amlodipine there previously. They are willing to dispense it for self-pay. I called the patient back and instructed him to 1) contact his insurance company to clarify why they have it on file that he's filled this before, and 2) if he wants to go ahead and fill it at [facility], he can do so for $11.99. He verbalized understanding. He will contact our office when we re-open in the morning if he has further issues.  Nashay Brickley PA-C

## 2014-06-30 DIAGNOSIS — J309 Allergic rhinitis, unspecified: Secondary | ICD-10-CM | POA: Diagnosis not present

## 2014-07-05 DIAGNOSIS — H04123 Dry eye syndrome of bilateral lacrimal glands: Secondary | ICD-10-CM | POA: Diagnosis not present

## 2014-07-05 DIAGNOSIS — S0502XA Injury of conjunctiva and corneal abrasion without foreign body, left eye, initial encounter: Secondary | ICD-10-CM | POA: Diagnosis not present

## 2014-07-05 DIAGNOSIS — Z961 Presence of intraocular lens: Secondary | ICD-10-CM | POA: Diagnosis not present

## 2014-07-05 DIAGNOSIS — H10413 Chronic giant papillary conjunctivitis, bilateral: Secondary | ICD-10-CM | POA: Diagnosis not present

## 2014-07-07 ENCOUNTER — Telehealth: Payer: Self-pay | Admitting: Cardiovascular Disease

## 2014-07-07 DIAGNOSIS — I10 Essential (primary) hypertension: Secondary | ICD-10-CM | POA: Diagnosis not present

## 2014-07-07 DIAGNOSIS — E7800 Pure hypercholesterolemia, unspecified: Secondary | ICD-10-CM

## 2014-07-07 DIAGNOSIS — I2510 Atherosclerotic heart disease of native coronary artery without angina pectoris: Secondary | ICD-10-CM | POA: Diagnosis not present

## 2014-07-07 DIAGNOSIS — J309 Allergic rhinitis, unspecified: Secondary | ICD-10-CM | POA: Diagnosis not present

## 2014-07-07 DIAGNOSIS — E039 Hypothyroidism, unspecified: Secondary | ICD-10-CM

## 2014-07-07 NOTE — Telephone Encounter (Signed)
Spoke to [place] - at [facility] in [place] -on [address] She states patient is there with lab slip  But it is for [facility] AND NOT [facility] Could we please change to correct lab slip. RN informed Janett Billow - will be done  Janett Billow received correct  Information.

## 2014-07-08 LAB — COMPREHENSIVE METABOLIC PANEL
ALT: 19 IU/L (ref 0–44)
AST: 22 IU/L (ref 0–40)
Albumin/Globulin Ratio: 1.8 (ref 1.1–2.5)
Albumin: 4.2 g/dL (ref 3.5–4.7)
Alkaline Phosphatase: 73 IU/L (ref 39–117)
BUN/Creatinine Ratio: 19 (ref 10–22)
BUN: 14 mg/dL (ref 8–27)
Bilirubin Total: 0.4 mg/dL (ref 0.0–1.2)
CALCIUM: 9.3 mg/dL (ref 8.6–10.2)
CHLORIDE: 96 mmol/L — AB (ref 97–108)
CO2: 25 mmol/L (ref 18–29)
CREATININE: 0.72 mg/dL — AB (ref 0.76–1.27)
GFR calc Af Amer: 99 mL/min/{1.73_m2} (ref >59)
GFR calc non Af Amer: 86 mL/min/{1.73_m2} (ref >59)
Globulin, Total: 2.3 g/dL (ref 1.5–4.5)
Glucose: 95 mg/dL (ref 65–99)
POTASSIUM: 4.5 mmol/L (ref 3.5–5.2)
Sodium: 135 mmol/L (ref 134–144)
Total Protein: 6.5 g/dL (ref 6.0–8.5)

## 2014-07-08 LAB — CBC
HEMATOCRIT: 40.4 % (ref 37.5–51.0)
HEMOGLOBIN: 12.8 g/dL (ref 12.6–17.7)
MCH: 32.7 pg (ref 26.6–33.0)
MCHC: 31.7 g/dL (ref 31.5–35.7)
MCV: 103 fL — ABNORMAL HIGH (ref 79–97)
Platelets: 238 10*3/uL (ref 150–379)
RBC: 3.91 x10E6/uL — ABNORMAL LOW (ref 4.14–5.80)
RDW: 14 % (ref 12.3–15.4)
WBC: 4.1 10*3/uL (ref 3.4–10.8)

## 2014-07-08 LAB — LIPID PANEL
CHOLESTEROL TOTAL: 131 mg/dL (ref 100–199)
Chol/HDL Ratio: 2.2 ratio units (ref 0.0–5.0)
HDL: 60 mg/dL (ref >39)
LDL CALC: 56 mg/dL (ref 0–99)
Triglycerides: 75 mg/dL (ref 0–149)
VLDL CHOLESTEROL CAL: 15 mg/dL (ref 5–40)

## 2014-07-08 LAB — TSH: TSH: 0.726 u[IU]/mL (ref 0.450–4.500)

## 2014-07-14 DIAGNOSIS — J309 Allergic rhinitis, unspecified: Secondary | ICD-10-CM | POA: Diagnosis not present

## 2014-07-21 ENCOUNTER — Other Ambulatory Visit: Payer: Self-pay | Admitting: *Deleted

## 2014-07-21 MED ORDER — LEVOTHYROXINE SODIUM 100 MCG PO TABS: 100.0000 ug | ORAL_TABLET | Freq: Every day | ORAL | Status: DC

## 2014-07-21 NOTE — Telephone Encounter (Signed)
Pt walked in, wanted refill & bloodwork results. All pt's q's addressed.

## 2014-07-22 DIAGNOSIS — J309 Allergic rhinitis, unspecified: Secondary | ICD-10-CM | POA: Diagnosis not present

## 2014-07-25 DIAGNOSIS — X32XXXD Exposure to sunlight, subsequent encounter: Secondary | ICD-10-CM | POA: Diagnosis not present

## 2014-07-25 DIAGNOSIS — C44219 Basal cell carcinoma of skin of left ear and external auricular canal: Secondary | ICD-10-CM | POA: Diagnosis not present

## 2014-07-25 DIAGNOSIS — D225 Melanocytic nevi of trunk: Secondary | ICD-10-CM | POA: Diagnosis not present

## 2014-07-25 DIAGNOSIS — L570 Actinic keratosis: Secondary | ICD-10-CM | POA: Diagnosis not present

## 2014-07-28 DIAGNOSIS — J309 Allergic rhinitis, unspecified: Secondary | ICD-10-CM | POA: Diagnosis not present

## 2014-08-03 ENCOUNTER — Telehealth: Payer: Self-pay | Admitting: *Deleted

## 2014-08-03 NOTE — Telephone Encounter (Signed)
Called Dr. Nolon Rod office to see if patient has had a B12/ folate done through their office. Spoke with Lattie Haw who informs me that in looking at the  recent labs done in [place] does not see where these labs were done. However when I questioned what his MCV was at that time the numbers that was reported were equivalent to our lab results on 07/07/14. I informed her that [name] recommends for this patient to have a B-12/ folate level done. She will notify Dr. Nolon Rod nurse/CMA and have them to order.

## 2014-08-03 NOTE — Telephone Encounter (Signed)
-----   Message from Troy Sine, MD sent at 07/30/2014 11:19 AM EDT ----- MCV inc; check B12/folate if not already done

## 2014-08-04 ENCOUNTER — Ambulatory Visit (INDEPENDENT_AMBULATORY_CARE_PROVIDER_SITE_OTHER): Payer: Medicare Other

## 2014-08-04 DIAGNOSIS — J309 Allergic rhinitis, unspecified: Secondary | ICD-10-CM

## 2014-08-10 ENCOUNTER — Telehealth: Payer: Self-pay | Admitting: Cardiovascular Disease

## 2014-08-10 NOTE — Telephone Encounter (Signed)
Pt would like some samples of Crestor please. °

## 2014-08-10 NOTE — Telephone Encounter (Signed)
Informed Mrs Lamere that we do not have any samples of Crestor available. Informed her that this medication will be going generic in May and asked if he needed a prescription prepared. Patient has enough pills to last him into May. Mrs Mozer stated that they will call when he needs prescription.

## 2014-08-11 DIAGNOSIS — J309 Allergic rhinitis, unspecified: Secondary | ICD-10-CM | POA: Diagnosis not present

## 2014-08-18 ENCOUNTER — Telehealth: Payer: Self-pay | Admitting: Cardiovascular Disease

## 2014-08-18 DIAGNOSIS — J309 Allergic rhinitis, unspecified: Secondary | ICD-10-CM | POA: Diagnosis not present

## 2014-08-18 NOTE — Telephone Encounter (Signed)
Pt would like some samples of Crestor please. °

## 2014-08-18 NOTE — Telephone Encounter (Signed)
LMOM stating that we are not receiving samples as Crestor is going Generic soon so there are none avb. Rx was sent into pharmacy Jan 2016 for #90 +3refills.

## 2014-08-22 ENCOUNTER — Telehealth: Payer: Self-pay | Admitting: Cardiovascular Disease

## 2014-08-22 NOTE — Telephone Encounter (Signed)
Line busy when called.

## 2014-08-22 NOTE — Telephone Encounter (Signed)
[name] is calling because Dale [name] is having a lot swelling in both ankles. He is taking a fluid pill but it does not seem to be working . Please call    Thanks

## 2014-08-23 NOTE — Telephone Encounter (Signed)
Pt. Is complaining of increased swelling in his ankles and can.t hardly get his shoes on , Pt. Eats at at [facility] every morning , had labs last month and his kidney function looks good , No complaint of SOB , HX of sarcoid.  Takes 12.5 mg of micronase daily , please advise, Pt.s PCP is Dr. Gerarda Fraction in [place]

## 2014-08-23 NOTE — Telephone Encounter (Signed)
Mrs. Dale [name] is calling about Mr Dale [name] having a lot of swelling in his ankles . Please call   Thanks

## 2014-08-24 NOTE — Telephone Encounter (Signed)
Per Answering Service: Have been expecting a call,pt was having some issues and somebody was going to call. She have not heard anything.

## 2014-08-24 NOTE — Telephone Encounter (Addendum)
Spoke to pt. Reports improvement of leg swelling, still bothering him a little. Labwork last month OK.  Advised pt can take extra HCTZ dose today (currently prescribed 12.5mg  daily PRN) if BP systolic 332+ - o/w no change to meds. If this improves, let us know.   Understanding verbalized. Will defer to [name] for addtl advice.  He has appt w/ [name] 09/26/14.

## 2014-08-24 NOTE — Telephone Encounter (Signed)
Pt's wife is calling back to follow on issue that she called in about 4/18. Please call  Thanks

## 2014-08-25 DIAGNOSIS — J309 Allergic rhinitis, unspecified: Secondary | ICD-10-CM | POA: Diagnosis not present

## 2014-08-29 NOTE — Telephone Encounter (Signed)
Agree with plan 

## 2014-09-01 DIAGNOSIS — J309 Allergic rhinitis, unspecified: Secondary | ICD-10-CM | POA: Diagnosis not present

## 2014-09-08 DIAGNOSIS — J309 Allergic rhinitis, unspecified: Secondary | ICD-10-CM | POA: Diagnosis not present

## 2014-09-12 ENCOUNTER — Telehealth: Payer: Self-pay | Admitting: Internal Medicine

## 2014-09-12 DIAGNOSIS — J069 Acute upper respiratory infection, unspecified: Secondary | ICD-10-CM | POA: Diagnosis not present

## 2014-09-12 DIAGNOSIS — J329 Chronic sinusitis, unspecified: Secondary | ICD-10-CM | POA: Diagnosis not present

## 2014-09-12 DIAGNOSIS — J029 Acute pharyngitis, unspecified: Secondary | ICD-10-CM | POA: Diagnosis not present

## 2014-09-12 NOTE — Telephone Encounter (Signed)
Pt aware of rec's per CY Nothing further needed. 

## 2014-09-12 NOTE — Telephone Encounter (Signed)
Spoke with pt. States that his allergies are flaring up. Reports sneezing, runny nose and a "burning" nose. Has been taking OTC allergy relief medication with no relief. Would like CY's recommendations.  Allergies  Allergen Reactions  . Azithromycin Other (See Comments)    Sore mouth and fever blisters around mouth, sores in nose area as well  . Doxazosin Shortness Of Breath  . Acetaminophen     REACTION: UNKNOWN REACTION  . Atenolol     REACTION: UNKNOWN REACTION  . Hydrocodone Nausea And Vomiting  . Morphine     "made me crazy"  . Penicillins Nausea And Vomiting  . Sulfonamide Derivatives Nausea And Vomiting    Current Outpatient Prescriptions on File Prior to Visit  Medication Sig Dispense Refill  . amLODipine (NORVASC) 5 MG tablet Take 1 tablet (5 mg total) by mouth daily. 30 tablet 3  . aspirin 81 MG tablet Take 81 mg by mouth every morning.     . clopidogrel (PLAVIX) 75 MG tablet Take 1 tablet by mouth  daily 90 tablet 3  . dorzolamide (TRUSOPT) 2 % ophthalmic solution Place 1 drop into both eyes 2 (two) times daily.     Marland Kitchen guaiFENesin (MUCINEX) 600 MG 12 hr tablet Take 1,200 mg by mouth 2 (two) times daily.    . hydrochlorothiazide (HYDRODIURIL) 12.5 MG tablet Take 1 tablet by mouth once a day as needed for fluid   retention 90 tablet 2  . isosorbide mononitrate (IMDUR) 60 MG 24 hr tablet pt take 60 mg in morning and 30 mg at night 135 tablet 3  . latanoprost (XALATAN) 0.005 % ophthalmic solution Place 1 drop into both eyes at bedtime.    Marland Kitchen levothyroxine (SYNTHROID, LEVOTHROID) 100 MCG tablet Take 1 tablet (100 mcg total) by mouth daily. 90 tablet 3  . losartan (COZAAR) 100 MG tablet Take 1 tablet by mouth  daily 90 tablet 3  . metoprolol tartrate (LOPRESSOR) 25 MG tablet Take one-half tablet by  mouth two times daily 90 tablet 3  . Multiple Vitamin (MULTIVITAMIN) tablet Take 1 tablet by mouth every evening.     . Multiple Vitamins-Minerals (PRESERVISION/LUTEIN) CAPS Take 1  capsule by mouth 2 (two) times daily.     . nitroGLYCERIN (NITROSTAT) 0.4 MG SL tablet Place 1 tablet (0.4 mg total) under the tongue every 5 (five) minutes as needed. For chest pain 25 tablet 2  . ondansetron (ZOFRAN ODT) 4 MG disintegrating tablet 4mg  ODT q4 hours prn nausea/vomit 4 tablet 0  . pantoprazole (PROTONIX) 40 MG tablet Take 1 tablet by mouth  daily 90 tablet 3  . potassium chloride (K-DUR,KLOR-CON) 10 MEQ tablet Take 1 tablet (10 mEq total) by mouth daily. 90 tablet 3  . PRESCRIPTION MEDICATION Inject 1 each as directed once a week. Allergy shot= thursday    . rosuvastatin (CRESTOR) 20 MG tablet Take 1 tablet (20 mg total) by mouth daily. 90 tablet 3  . timolol (TIMOPTIC) 0.5 % ophthalmic solution Place 1 drop into both eyes 2 (two) times daily.     Marland Kitchen triamcinolone (KENALOG) 0.1 % cream Apply topically 2 (two) times daily as needed.      . vitamin C (ASCORBIC ACID) 500 MG tablet Take 500 mg by mouth every evening.     . vitamin E 200 UNIT capsule Take 200 Units by mouth every evening.     . [DISCONTINUED] potassium chloride (K-DUR) 10 MEQ tablet Take 1 tablet (10 mEq total) by mouth daily. 90 tablet 3  No current facility-administered medications on file prior to visit.    CY - please advise. Thanks.

## 2014-09-12 NOTE — Telephone Encounter (Signed)
Suggest Flonase otc nasal spray - 2 puffs each nostril once or twice daily as needed

## 2014-09-16 DIAGNOSIS — H40053 Ocular hypertension, bilateral: Secondary | ICD-10-CM | POA: Diagnosis not present

## 2014-09-16 DIAGNOSIS — H01025 Squamous blepharitis left lower eyelid: Secondary | ICD-10-CM | POA: Diagnosis not present

## 2014-09-16 DIAGNOSIS — H01021 Squamous blepharitis right upper eyelid: Secondary | ICD-10-CM | POA: Diagnosis not present

## 2014-09-16 DIAGNOSIS — H01024 Squamous blepharitis left upper eyelid: Secondary | ICD-10-CM | POA: Diagnosis not present

## 2014-09-16 DIAGNOSIS — J309 Allergic rhinitis, unspecified: Secondary | ICD-10-CM | POA: Diagnosis not present

## 2014-09-16 DIAGNOSIS — R0602 Shortness of breath: Secondary | ICD-10-CM | POA: Diagnosis not present

## 2014-09-16 DIAGNOSIS — H04123 Dry eye syndrome of bilateral lacrimal glands: Secondary | ICD-10-CM | POA: Diagnosis not present

## 2014-09-16 DIAGNOSIS — H01022 Squamous blepharitis right lower eyelid: Secondary | ICD-10-CM | POA: Diagnosis not present

## 2014-09-19 ENCOUNTER — Encounter: Payer: Self-pay | Admitting: Internal Medicine

## 2014-09-22 DIAGNOSIS — J309 Allergic rhinitis, unspecified: Secondary | ICD-10-CM | POA: Diagnosis not present

## 2014-09-26 ENCOUNTER — Encounter: Payer: Self-pay | Admitting: Cardiovascular Disease

## 2014-09-26 ENCOUNTER — Ambulatory Visit (INDEPENDENT_AMBULATORY_CARE_PROVIDER_SITE_OTHER): Payer: Medicare Other | Admitting: Cardiovascular Disease

## 2014-09-26 VITALS — BP 148/80 | HR 62 | Ht 69.0 in | Wt 146.4 lb

## 2014-09-26 DIAGNOSIS — I2510 Atherosclerotic heart disease of native coronary artery without angina pectoris: Secondary | ICD-10-CM | POA: Diagnosis not present

## 2014-09-26 DIAGNOSIS — R238 Other skin changes: Secondary | ICD-10-CM

## 2014-09-26 DIAGNOSIS — I440 Atrioventricular block, first degree: Secondary | ICD-10-CM | POA: Diagnosis not present

## 2014-09-26 DIAGNOSIS — R233 Spontaneous ecchymoses: Secondary | ICD-10-CM | POA: Insufficient documentation

## 2014-09-26 DIAGNOSIS — E785 Hyperlipidemia, unspecified: Secondary | ICD-10-CM

## 2014-09-26 NOTE — Patient Instructions (Signed)
Your physician wants you to follow-up in: 6 Months You will receive a reminder letter in the mail two months in advance. If you don't receive a letter, please call our office to schedule the follow-up appointment.  Your physician has recommended you make the following change in your medication: Take Aspirin every other day

## 2014-09-26 NOTE — Progress Notes (Signed)
Patient ID: Dale [name], male   DOB: [birth year], 81 y.o.   MRN: [id number]     HPI: Dale [name] is a 81 y.o. male who presents to the office for a 3 month cardiology evaluation.  Dale [name] has known CAD and underwent initial intervention to his RCA in 2000. In 2009 a Cypher stent was placed beyond the acute margin of his RCA. His last cardiac catheterization was in September 2013 by Dr. [name] which showed 30% LAD narrowing after the second diagonal vessel, and normal left circumflex coronary artery, and patent RCA stents.  He has a history of hypothyroidism on Synthroid replacement and has a history of hyperlipidemia. He had been on on Crestor 5 mg plus Zetia 10 mg; however, due to recent increased cost of Zetia, this was discontinued and he has been on Crestor 20 mg daily.  He also has a history of GERD treated with Protonix, and he continues to be on dual antiplatelet therapy. He is responsive to Plavix on previous P2 Y12 testing.  When I last saw him 6 months ago he stated that he had experienced 3-4 episodes of chest pain over the six-month period.  Most of these episodes have occurred at night.  However, they have been nitrate responsive.  He states that they were similar to his previous discomfort.  When I saw him, I further titrated his isosorbide mononitrate to 60 mg in the morning and 30 mg at night.  He denies any definitive exertional precipitation of chest pain.  But at times he does admit to some chest wall-like discomfort, particularly when he loses arms up over his head.  He has a history of hypertension and recently has been maintained on losartan 100 mg, metoprolol, tartrate 12.5 mg twice a day, and HCTZ 12.5 mg.    He admits to experiencing  ankle edema 2 weeks ago, and this has improved.  He denies awareness of excessive sodium intake.  He also admits to easy bruisability particularly after rubbing an area in his upper chest below the sternal notch.  He admits to expressing rare  sharp plane which lasts seconds.  He denies his prior anginal type discomfort.  Past Medical History  Diagnosis Date  . Coronary heart disease     s/p stenting. cath in 01/2012 noncritical occlusion  . Idiopathic thrombocytopenic purpura 2002  . Allergic rhinitis   . Sarcoidosis     pulmonary  . Hyperlipidemia   . Hypertension   . Macular degeneration   . Hypothyroidism   . Chest pain   . GERD (gastroesophageal reflux disease)   . CHF (congestive heart failure)   . Cancer     skin  . Asthma   . Glaucoma   . Nephrolithiasis   . PUD (peptic ulcer disease)     remote  . Schatzki's ring   . Hiatal hernia   . Anal fissure     Past Surgical History  Procedure Laterality Date  . Mediastinoscopy      for dx sarcoid  . Coronary angioplasty with stent placement    . Cardiac stents    . Esophagogastroduodenoscopy  06/19/2004    Two esophageal rings and esophageal web as described above.  All of these were disrupted by passing 56-French Venia Minks dilator/ Candida esophagitis,which appears to be incidental given history of   antibiotic use, but nevertheless will be treated.  . Esophagogastroduodenoscopy  10/30/2006    Distal tandem esophageal ring status post dilation disruption as  described above.  Otherwise normal esophagus/  Small hiatal hernia otherwise normal stomach, D1 and D2  . Colonoscopy  10/30/2006    Normal rectum, sigmoid diverticula.Remainder of colonic mucosa appeared normal.  . Esophagogastroduodenoscopy (egd) with esophageal dilation  03/04/2012    RMR- schatzki's ring, hiatal hernia, polypoid gastric mucosa, bx= minimally active gastritis.  . Left heart cath N/A 02/02/2012    Procedure: LEFT HEART CATH;  Surgeon: Lorretta Harp, MD;  Location: [facility] CATH LAB;  Service: Cardiovascular;  Laterality: N/A;    Allergies  Allergen Reactions  . Azithromycin Other (See Comments)    Sore mouth and fever blisters around mouth, sores in nose area as well  . Doxazosin  Shortness Of Breath  . Acetaminophen     REACTION: UNKNOWN REACTION  . Atenolol     REACTION: UNKNOWN REACTION  . Hydrocodone Nausea And Vomiting  . Levofloxacin Other (See Comments)    Caused stomach problems.  . Morphine     "made me crazy"  . Penicillins Nausea And Vomiting  . Sulfonamide Derivatives Nausea And Vomiting    Current Outpatient Prescriptions  Medication Sig Dispense Refill  . amLODipine (NORVASC) 5 MG tablet Take 1 tablet (5 mg total) by mouth daily. 30 tablet 3  . aspirin 81 MG tablet Take 81 mg by mouth. Every other day    . clopidogrel (PLAVIX) 75 MG tablet Take 1 tablet by mouth  daily 90 tablet 3  . dorzolamide (TRUSOPT) 2 % ophthalmic solution Place 1 drop into both eyes 2 (two) times daily.     Marland Kitchen guaiFENesin (MUCINEX) 600 MG 12 hr tablet Take 1,200 mg by mouth 2 (two) times daily.    . hydrochlorothiazide (HYDRODIURIL) 12.5 MG tablet Take 1 tablet by mouth once a day as needed for fluid   retention 90 tablet 2  . isosorbide mononitrate (IMDUR) 60 MG 24 hr tablet pt take 60 mg in morning and 30 mg at night 135 tablet 3  . latanoprost (XALATAN) 0.005 % ophthalmic solution Place 1 drop into both eyes at bedtime.    Marland Kitchen levothyroxine (SYNTHROID, LEVOTHROID) 100 MCG tablet Take 1 tablet (100 mcg total) by mouth daily. 90 tablet 3  . losartan (COZAAR) 100 MG tablet Take 1 tablet by mouth  daily 90 tablet 3  . metoprolol tartrate (LOPRESSOR) 25 MG tablet Take one-half tablet by  mouth two times daily 90 tablet 3  . Multiple Vitamin (MULTIVITAMIN) tablet Take 1 tablet by mouth every evening.     . Multiple Vitamins-Minerals (PRESERVISION/LUTEIN) CAPS Take 1 capsule by mouth 2 (two) times daily.     . nitroGLYCERIN (NITROSTAT) 0.4 MG SL tablet Place 1 tablet (0.4 mg total) under the tongue every 5 (five) minutes as needed. For chest pain 25 tablet 2  . ondansetron (ZOFRAN ODT) 4 MG disintegrating tablet 76m ODT q4 hours prn nausea/vomit 4 tablet 0  . pantoprazole  (PROTONIX) 40 MG tablet Take 1 tablet by mouth  daily 90 tablet 3  . potassium chloride (K-DUR,KLOR-CON) 10 MEQ tablet Take 1 tablet (10 mEq total) by mouth daily. 90 tablet 3  . PRESCRIPTION MEDICATION Inject 1 each as directed once a week. Allergy shot= thursday    . rosuvastatin (CRESTOR) 20 MG tablet Take 1 tablet (20 mg total) by mouth daily. 90 tablet 3  . timolol (TIMOPTIC) 0.5 % ophthalmic solution Place 1 drop into both eyes daily.     .Marland Kitchentriamcinolone (KENALOG) 0.1 % cream Apply topically 2 (two) times daily as needed.      .Marland Kitchen  vitamin C (ASCORBIC ACID) 500 MG tablet Take 500 mg by mouth every evening.     . vitamin E 200 UNIT capsule Take 200 Units by mouth every evening.     . [DISCONTINUED] potassium chloride (K-DUR) 10 MEQ tablet Take 1 tablet (10 mEq total) by mouth daily. 90 tablet 3   No current facility-administered medications for this visit.    History   Social History  . Marital Status: Married    Spouse Name: N/A  . Number of Children: 1  . Years of Education: N/A   Occupational History  . Retired     Visual merchandiser pumping station  .     Social History Main Topics  . Smoking status: Former Smoker -- 0.10 packs/day for 2 years    Types: Cigarettes, Cigars    Quit date: 05/06/1970  . Smokeless tobacco: Never Used  . Alcohol Use: No  . Drug Use: No  . Sexual Activity: No   Other Topics Concern  . Not on file   Social History Narrative   Additional social history is notable that he is married and lives with his wife. He quit smoking over 40 years ago. Has one child. He remains active.  Family History  Problem Relation Age of Onset  . Heart disease Father     deceased age 19  . Stroke Mother   . Alzheimer's disease Mother   . Heart attack Brother     deceased age 46  . Colon cancer Neg Hx   . Cancer Other     niece   ROS General: Negative; No fevers, chills, or night sweats;  HEENT: Negative; No changes in vision or hearing, sinus congestion,  difficulty swallowing Pulmonary: Negative; No cough, wheezing, shortness of breath, hemoptysis Cardiovascular: See history of present illness GI: Negative; No nausea, vomiting, diarrhea, or abdominal pain GU: Negative; No dysuria, hematuria, or difficulty voiding Musculoskeletal: Negative; no myalgias, joint pain, or weakness Hematologic/Oncology: Negative; no easy bruising, bleeding Endocrine: Positive for hypothyroidism; no diabetes Neuro: Negative; no changes in balance, headaches Skin: Negative; No rashes or skin lesions Psychiatric: Negative; No behavioral problems, depression Sleep: Negative; No snoring, daytime sleepiness, hypersomnolence, bruxism, restless legs, hypnogognic hallucinations, no cataplexy Other comprehensive 14 point system review is negative.   PE BP 148/80 mmHg  Pulse 62  Ht _0  (1.753 m)  Wt 146 lb 6.4 oz (66.407 kg)  BMI 21.61 kg/m2   Wt Readings from Last 3 Encounters:  09/26/14 146 lb 6.4 oz (66.407 kg)  06/28/14 143 lb 8 oz (65.091 kg)  04/07/14 135 lb 4.8 oz (61.372 kg)   General: Alert, oriented, no distress.  Skin: Area of ecchymosis in a triangular fashion under the sternal notch in an area where he had rubbed.  Also significant ecchymoses to his arms bilaterally HEENT: Normocephalic, atraumatic. Pupils round and reactive; sclera anicteric;no lid lag.  Nose without nasal septal hypertrophy Mouth/Parynx benign; Mallinpatti scale 2 Neck: No JVD, no carotid bruits with normal carotid upstroke Lungs: clear to ausculatation and percussion; no wheezing or rales Chest wall: Nontender to palpation; specifically, no costochondral tenderness. Heart: RRR, s1 s2 normal 1/6 systolic murmur, unchanged. No S3 gallop no diastolic murmur, no rubs thrills or heaves Abdomen: soft, nontender; no hepatosplenomehaly, BS+; abdominal aorta nontender and not dilated by palpation. Back: No CVA tenderness Pulses 2+ Extremities: Trace ankle edema,  no clubbing cyanosis,  Homan's sign negative  Neurologic: grossly nonfocal Psychologic: normal affect and mood.  ECG (independently read by me):  Sinus rhythm with first-degree heart block.  PR interval 226.  No ST segment changes.  February 2016 ECG (independently read by me): Sinus rhythm at 65 bpm.  Mild first-degree AV block with a PR interval 228 ms.  No significant ST segment changes.  December 2015 ECG (independently read by me): Sinus bradycardia 57 bpm.  Borderline first-degree AV block with a PR interval 216 daily.  Seconds.  QTc interval is normal at 373 ms.  June 2015 ECG (independently read by me): Sinus bradycardia 54 beats per minute.  First degree block with a PR interval of 232 ms.  No significant ST-T changes.  Prior ECG: Sinus bradycardia 54 beats per minute. PR interval 206 ms. QTc interval normal  LABS: BMP Latest Ref Rng 07/07/2014 05/11/2013 02/03/2012  Glucose 65 - 99 mg/dL 95 127(H) 125(H)  BUN 8 - 27 mg/dL _0 Creatinine 0.76 - 1.27 mg/dL 0.72(L) 0.76 0.83  BUN/Creat Ratio 10 - 22 19 - -  Sodium 134 - 144 mmol/L 135 136(L) 134(L)  Potassium 3.5 - 5.2 mmol/L 4.5 3.9 3.5  Chloride 97 - 108 mmol/L 96(L) 99 101  CO2 18 - 29 mmol/L _1 Calcium 8.6 - 10.2 mg/dL 9.3 9.2 8.6   Hepatic Function Latest Ref Rng 07/07/2014 05/11/2013 09/05/2009  Total Protein 6.0 - 8.5 g/dL 6.5 6.7 6.3  Albumin 3.5 - 5.2 g/dL - 3.6 3.6  AST 0 - 40 IU/L _2 ALT 0 - 44 IU/L _3 Alk Phosphatase 39 - 117 IU/L 73 74 71  Total Bilirubin 0.0 - 1.2 mg/dL 0.4 0.4 0.9  Bilirubin, Direct - - - -   CBC Latest Ref Rng 07/07/2014 05/11/2013 02/03/2012  WBC 3.4 - 10.8 x10E3/uL 4.1 7.3 10.5  Hemoglobin 12.6 - 17.7 g/dL 12.8 12.8(L) 11.0(L)  Hematocrit 37.5 - 51.0 % 40.4 38.2(L) 33.4(L)  Platelets 150 - 379 x10E3/uL 238 157 158   Lab Results  Component Value Date   MCV 103* 07/07/2014   MCV 101.6* 05/11/2013   MCV 97.4 02/03/2012   Lab Results  Component Value Date   TSH 0.726 07/07/2014  No results  found for: HGBA1C  Lipid Panel     Component Value Date/Time   CHOL 131 07/07/2014 0954   CHOL 107 08/15/2011 0555   TRIG 75 07/07/2014 0954   HDL 60 07/07/2014 0954   HDL 41 08/15/2011 0555   CHOLHDL 2.2 07/07/2014 0954   CHOLHDL 2.6 08/15/2011 0555   VLDL 15 08/15/2011 0555   LDLCALC 56 07/07/2014 0954   LDLCALC 51 08/15/2011 0555   RADIOLOGY: No results found.    ASSESSMENT AND PLAN: Mr. Quamel [name] is an 81 years old gentleman who is 16 years status post initial intervention to his RCA. He required subsequent intervention in 2009. At last catheterization in 2013 his RCA stents were patent and he had mild 30% LAD narrowing.  When I last saw him, I added amlodipine 5 mg to his medical regimen, both for blood pressure control and potential anti-anginal benefit.  He now has been on an increased dose of Crestor 20 mg and is tolerating this.  His most recent LDL cholesterol was excellent at 56.  He denies myalgias.  He is controlled on amlodipine 5 mg, losartan 100 mg, and he is taking metoprolol, tartrate 12.5 twice a day.  He continues to take Protonix 40 mg for GERD.  In this is stable.  He is on aspirin 81 mg and  Plavix for continued antiplatelet benefit.  He does have easy bruisability with ecchymoses over his arms and there is a significant area of ecchymosis due to rubbing under his sternal notch.  I recommended he reduce his aspirin to every other day but continue the Plavix. He is history of hypothyroidism on Synthroid replacement.  I will see him in 6 months for cardiology reevaluation. Time spent: 25 minutes  Troy Sine, MD, [facility]  09/26/2014 6:45 PM

## 2014-09-29 DIAGNOSIS — J309 Allergic rhinitis, unspecified: Secondary | ICD-10-CM | POA: Diagnosis not present

## 2014-09-30 DIAGNOSIS — H40053 Ocular hypertension, bilateral: Secondary | ICD-10-CM | POA: Diagnosis not present

## 2014-09-30 DIAGNOSIS — Z961 Presence of intraocular lens: Secondary | ICD-10-CM | POA: Diagnosis not present

## 2014-09-30 DIAGNOSIS — H1131 Conjunctival hemorrhage, right eye: Secondary | ICD-10-CM | POA: Diagnosis not present

## 2014-10-06 DIAGNOSIS — J309 Allergic rhinitis, unspecified: Secondary | ICD-10-CM | POA: Diagnosis not present

## 2014-10-11 DIAGNOSIS — M5136 Other intervertebral disc degeneration, lumbar region: Secondary | ICD-10-CM | POA: Diagnosis not present

## 2014-10-11 DIAGNOSIS — S32010S Wedge compression fracture of first lumbar vertebra, sequela: Secondary | ICD-10-CM | POA: Diagnosis not present

## 2014-10-11 DIAGNOSIS — M546 Pain in thoracic spine: Secondary | ICD-10-CM | POA: Diagnosis not present

## 2014-10-11 DIAGNOSIS — I10 Essential (primary) hypertension: Secondary | ICD-10-CM | POA: Diagnosis not present

## 2014-10-11 DIAGNOSIS — M47816 Spondylosis without myelopathy or radiculopathy, lumbar region: Secondary | ICD-10-CM | POA: Diagnosis not present

## 2014-10-11 DIAGNOSIS — M545 Low back pain: Secondary | ICD-10-CM | POA: Diagnosis not present

## 2014-10-13 DIAGNOSIS — J309 Allergic rhinitis, unspecified: Secondary | ICD-10-CM | POA: Diagnosis not present

## 2014-10-20 DIAGNOSIS — J309 Allergic rhinitis, unspecified: Secondary | ICD-10-CM | POA: Diagnosis not present

## 2014-10-25 DIAGNOSIS — C4442 Squamous cell carcinoma of skin of scalp and neck: Secondary | ICD-10-CM | POA: Diagnosis not present

## 2014-10-25 DIAGNOSIS — L570 Actinic keratosis: Secondary | ICD-10-CM | POA: Diagnosis not present

## 2014-10-25 DIAGNOSIS — D692 Other nonthrombocytopenic purpura: Secondary | ICD-10-CM | POA: Diagnosis not present

## 2014-10-25 DIAGNOSIS — D485 Neoplasm of uncertain behavior of skin: Secondary | ICD-10-CM | POA: Diagnosis not present

## 2014-10-25 DIAGNOSIS — C44329 Squamous cell carcinoma of skin of other parts of face: Secondary | ICD-10-CM | POA: Diagnosis not present

## 2014-10-25 DIAGNOSIS — D1801 Hemangioma of skin and subcutaneous tissue: Secondary | ICD-10-CM | POA: Diagnosis not present

## 2014-10-27 DIAGNOSIS — J309 Allergic rhinitis, unspecified: Secondary | ICD-10-CM | POA: Diagnosis not present

## 2014-11-03 DIAGNOSIS — J309 Allergic rhinitis, unspecified: Secondary | ICD-10-CM | POA: Diagnosis not present

## 2014-11-10 DIAGNOSIS — J309 Allergic rhinitis, unspecified: Secondary | ICD-10-CM | POA: Diagnosis not present

## 2014-11-17 DIAGNOSIS — J309 Allergic rhinitis, unspecified: Secondary | ICD-10-CM | POA: Diagnosis not present

## 2014-11-24 ENCOUNTER — Telehealth: Payer: Self-pay | Admitting: Internal Medicine

## 2014-11-24 ENCOUNTER — Ambulatory Visit (INDEPENDENT_AMBULATORY_CARE_PROVIDER_SITE_OTHER): Payer: Medicare Other

## 2014-11-24 DIAGNOSIS — J309 Allergic rhinitis, unspecified: Secondary | ICD-10-CM

## 2014-11-24 NOTE — Telephone Encounter (Signed)
Date Mixed: 11/24/14 Vial: 1 Strength: 1:50 Here/Mail/Pick Up: mail Mixed By: Laurette Schimke

## 2014-11-25 ENCOUNTER — Encounter: Payer: Self-pay | Admitting: Internal Medicine

## 2014-11-25 ENCOUNTER — Ambulatory Visit (INDEPENDENT_AMBULATORY_CARE_PROVIDER_SITE_OTHER): Payer: Medicare Other | Admitting: Internal Medicine

## 2014-11-25 VITALS — BP 125/70 | HR 62 | Temp 97.1°F | Ht 68.0 in | Wt 143.0 lb

## 2014-11-25 DIAGNOSIS — I2510 Atherosclerotic heart disease of native coronary artery without angina pectoris: Secondary | ICD-10-CM | POA: Diagnosis not present

## 2014-11-25 DIAGNOSIS — K219 Gastro-esophageal reflux disease without esophagitis: Secondary | ICD-10-CM | POA: Diagnosis not present

## 2014-11-25 NOTE — Progress Notes (Signed)
Primary Care Physician:  Glo Herring., MD Primary Gastroenterologist:  Dr. Gala Romney  Pre-Procedure History & Physical: HPI:  Dale [name] is a 81 y.o. male here for  followup of GERD and history of Schatzki's ring. Patient has done very well on acid suppression therapy in the way of Dexilant 60 mg daily or pantoprazole 40 mg daily. Patient can't tell any difference. Both do a good job in controlling his symptoms.  He alternates from one to the other when Dexilant is too expensive.  No dysphagia. No abdominal pain; no early satiety, nausea or vomiting. Hasn't had any melena or rectal bleeding.  Past Medical History  Diagnosis Date  . Coronary heart disease     s/p stenting. cath in 01/2012 noncritical occlusion  . Idiopathic thrombocytopenic purpura 2002  . Allergic rhinitis   . Sarcoidosis     pulmonary  . Hyperlipidemia   . Hypertension   . Macular degeneration   . Hypothyroidism   . Chest pain   . GERD (gastroesophageal reflux disease)   . CHF (congestive heart failure)   . Cancer     skin  . Asthma   . Glaucoma   . Nephrolithiasis   . PUD (peptic ulcer disease)     remote  . Schatzki's ring   . Hiatal hernia   . Anal fissure     Past Surgical History  Procedure Laterality Date  . Mediastinoscopy      for dx sarcoid  . Coronary angioplasty with stent placement    . Cardiac stents    . Esophagogastroduodenoscopy  06/19/2004    Two esophageal rings and esophageal web as described above.  All of these were disrupted by passing 56-French Venia Minks dilator/ Candida esophagitis,which appears to be incidental given history of   antibiotic use, but nevertheless will be treated.  . Esophagogastroduodenoscopy  10/30/2006    Distal tandem esophageal ring status post dilation disruption as  described above.  Otherwise normal esophagus/  Small hiatal hernia otherwise normal stomach, D1 and D2  . Colonoscopy  10/30/2006    Normal rectum, sigmoid diverticula.Remainder of colonic  mucosa appeared normal.  . Esophagogastroduodenoscopy (egd) with esophageal dilation  03/04/2012    RMR- schatzki's ring, hiatal hernia, polypoid gastric mucosa, bx= minimally active gastritis.  . Left heart cath N/A 02/02/2012    Procedure: LEFT HEART CATH;  Surgeon: Lorretta Harp, MD;  Location: [facility] CATH LAB;  Service: Cardiovascular;  Laterality: N/A;    Prior to Admission medications   Medication Sig Start Date End Date Taking? Authorizing Provider  amLODipine (NORVASC) 5 MG tablet Take 1 tablet (5 mg total) by mouth daily. 06/28/14  Yes Troy Sine, MD  aspirin 81 MG tablet Take 81 mg by mouth. Every other day   Yes Historical Provider, MD  clopidogrel (PLAVIX) 75 MG tablet Take 1 tablet by mouth  daily 03/11/14  Yes Troy Sine, MD  dorzolamide (TRUSOPT) 2 % ophthalmic solution Place 1 drop into both eyes 2 (two) times daily.    Yes Historical Provider, MD  guaiFENesin (MUCINEX) 600 MG 12 hr tablet Take 1,200 mg by mouth 2 (two) times daily.   Yes Historical Provider, MD  hydrochlorothiazide (HYDRODIURIL) 12.5 MG tablet Take 1 tablet by mouth once a day as needed for fluid   retention 06/24/14  Yes Troy Sine, MD  isosorbide mononitrate (IMDUR) 60 MG 24 hr tablet pt take 60 mg in morning and 30 mg at night 04/15/14  Yes Joyice Faster  Claiborne Billings, MD  latanoprost (XALATAN) 0.005 % ophthalmic solution Place 1 drop into both eyes at bedtime.   Yes Historical Provider, MD  levothyroxine (SYNTHROID, LEVOTHROID) 100 MCG tablet Take 1 tablet (100 mcg total) by mouth daily. 07/21/14  Yes Troy Sine, MD  losartan (COZAAR) 100 MG tablet Take 1 tablet by mouth  daily 04/12/14  Yes Troy Sine, MD  metoprolol tartrate (LOPRESSOR) 25 MG tablet Take one-half tablet by  mouth two times daily 04/12/14  Yes Troy Sine, MD  Multiple Vitamin (MULTIVITAMIN) tablet Take 1 tablet by mouth every evening.    Yes Historical Provider, MD  Multiple Vitamins-Minerals (PRESERVISION/LUTEIN) CAPS Take 1 capsule by  mouth 2 (two) times daily.    Yes Historical Provider, MD  nitroGLYCERIN (NITROSTAT) 0.4 MG SL tablet Place 1 tablet (0.4 mg total) under the tongue every 5 (five) minutes as needed. For chest pain 04/07/14  Yes Troy Sine, MD  ondansetron [facility] ODT) 4 MG disintegrating tablet 4mg  ODT q4 hours prn nausea/vomit 05/11/13  Yes Elnora Morrison, MD  pantoprazole (PROTONIX) 40 MG tablet Take 1 tablet by mouth  daily 04/12/14  Yes Orvil Feil, NP  potassium chloride (K-DUR,KLOR-CON) 10 MEQ tablet Take 1 tablet (10 mEq total) by mouth daily. 12/21/13  Yes Troy Sine, MD  PRESCRIPTION MEDICATION Inject 1 each as directed once a week. Allergy shot= thursday   Yes Historical Provider, MD  rosuvastatin (CRESTOR) 20 MG tablet Take 1 tablet (20 mg total) by mouth daily. 05/19/14  Yes Troy Sine, MD  timolol (TIMOPTIC) 0.5 % ophthalmic solution Place 1 drop into both eyes daily.    Yes Historical Provider, MD  triamcinolone (KENALOG) 0.1 % cream Apply topically 2 (two) times daily as needed.     Yes Historical Provider, MD  vitamin C (ASCORBIC ACID) 500 MG tablet Take 500 mg by mouth every evening.    Yes Historical Provider, MD  vitamin E 200 UNIT capsule Take 200 Units by mouth every evening.    Yes Historical Provider, MD    Allergies as of 11/25/2014 - Review Complete 11/25/2014  Allergen Reaction Noted  . Azithromycin Other (See Comments) 06/03/2013  . Doxazosin Shortness Of Breath 02/11/2012  . Acetaminophen    . Atenolol    . Hydrocodone Nausea And Vomiting 08/15/2011  . Levofloxacin Other (See Comments) 09/26/2014  . Morphine    . Penicillins Nausea And Vomiting   . Sulfonamide derivatives Nausea And Vomiting     Family History  Problem Relation Age of Onset  . Heart disease Father     deceased age 28  . Stroke Mother   . Alzheimer's disease Mother   . Heart attack Brother     deceased age 39  . Colon cancer Neg Hx   . Cancer Other     niece    History   Social History  .  Marital Status: Married    Spouse Name: N/A  . Number of Children: 1  . Years of Education: N/A   Occupational History  . Retired     Visual merchandiser pumping station  .     Social History Main Topics  . Smoking status: Former Smoker -- 0.10 packs/day for 2 years    Types: Cigarettes, Cigars    Quit date: 05/06/1970  . Smokeless tobacco: Never Used  . Alcohol Use: No  . Drug Use: No  . Sexual Activity: No   Other Topics Concern  . Not on file  Social History Narrative    Review of Systems: See HPI, otherwise negative ROS  Physical Exam: BP 125/70 mmHg  Pulse 62  Temp(Src) 97.1 F (36.2 C) (Oral)  Ht 5\' 8"  (1.727 m)  Wt 143 lb (64.864 kg)  BMI 21.75 kg/m2 General:   Alert,  elderly well-nourished, pleasant and cooperative in NAD Skin:  Intact without significant lesions or rashes. Eyes:  Sclera clear, no icterus.   Conjunctiva pink. Ears:  Normal auditory acuity. Nose:  No deformity, discharge,  or lesions. Mouth:  No deformity or lesions. Neck:  Supple; no masses or thyromegaly. No significant cervical adenopathy. Lungs:  Clear throughout to auscultation.   No wheezes, crackles, or rhonchi. No acute distress. Heart:  Regular rate and rhythm; no murmurs, clicks, rubs,  or gallops. Abdomen: Non-distended, normal bowel sounds.  Soft and nontender without appreciable mass or hepatosplenomegaly.  Pulses:  Normal pulses noted. Extremities:  Without clubbing or edema.  Impression:  Pleasant 81 year old gentleman with long-standing GERD, complicated by a Schatzki's requiring dilation previously. Clinically, doing very well on acid suppression therapy. We discussed the chronic nature of GERD and the need to take acid suppression long term. We also talked about the side effects of long-term PPI therapy. In the case of this patient, I feel the benefits far outweigh the risks.  Recommendations:  Continue pantoprazole 40 mg daily - indefinitely(as discussed, the benefits out weigh  the risks).  GERD information provided.  Office visit in 1 year      Notice: This dictation was prepared with Dragon dictation along with smaller phrase technology. Any transcriptional errors that result from this process are unintentional and may not be corrected upon review.

## 2014-11-25 NOTE — Patient Instructions (Signed)
Continue pantoprazole 40 mg daily - indefinitely(as discussed, the benefits out weigh the risks)  GERD information  Office visit in 1 year

## 2014-12-01 DIAGNOSIS — J309 Allergic rhinitis, unspecified: Secondary | ICD-10-CM | POA: Diagnosis not present

## 2014-12-01 DIAGNOSIS — H40053 Ocular hypertension, bilateral: Secondary | ICD-10-CM | POA: Diagnosis not present

## 2014-12-01 DIAGNOSIS — Z961 Presence of intraocular lens: Secondary | ICD-10-CM | POA: Diagnosis not present

## 2014-12-07 ENCOUNTER — Telehealth: Payer: Self-pay

## 2014-12-07 NOTE — Telephone Encounter (Signed)
Patient walked in office stated generic crestor too expensive.Stated he wants to change to atorvastatin better price.Message sent to [facility] for a order.

## 2014-12-08 ENCOUNTER — Other Ambulatory Visit: Payer: Self-pay | Admitting: *Deleted

## 2014-12-08 DIAGNOSIS — J309 Allergic rhinitis, unspecified: Secondary | ICD-10-CM | POA: Diagnosis not present

## 2014-12-12 ENCOUNTER — Other Ambulatory Visit: Payer: Self-pay | Admitting: *Deleted

## 2014-12-13 NOTE — Telephone Encounter (Signed)
Switch patient to atorvastatin 40 qd

## 2014-12-14 ENCOUNTER — Other Ambulatory Visit: Payer: Self-pay | Admitting: *Deleted

## 2014-12-14 MED ORDER — ATORVASTATIN CALCIUM 40 MG PO TABS: 40.0000 mg | ORAL_TABLET | Freq: Every day | ORAL | Status: DC

## 2014-12-14 NOTE — Telephone Encounter (Signed)
Patient called stating crestor too expensive. Changed to atorvastatin 40 mg per Nehemiah Massed

## 2014-12-16 DIAGNOSIS — J309 Allergic rhinitis, unspecified: Secondary | ICD-10-CM | POA: Diagnosis not present

## 2014-12-22 DIAGNOSIS — J309 Allergic rhinitis, unspecified: Secondary | ICD-10-CM | POA: Diagnosis not present

## 2014-12-28 ENCOUNTER — Encounter: Payer: Self-pay | Admitting: Internal Medicine

## 2014-12-29 ENCOUNTER — Other Ambulatory Visit: Payer: Self-pay | Admitting: Cardiovascular Disease

## 2014-12-29 DIAGNOSIS — J309 Allergic rhinitis, unspecified: Secondary | ICD-10-CM | POA: Diagnosis not present

## 2014-12-29 NOTE — Telephone Encounter (Signed)
Rx request sent to pharmacy.  

## 2015-01-05 DIAGNOSIS — J309 Allergic rhinitis, unspecified: Secondary | ICD-10-CM | POA: Diagnosis not present

## 2015-01-12 DIAGNOSIS — J309 Allergic rhinitis, unspecified: Secondary | ICD-10-CM | POA: Diagnosis not present

## 2015-01-19 DIAGNOSIS — X32XXXD Exposure to sunlight, subsequent encounter: Secondary | ICD-10-CM | POA: Diagnosis not present

## 2015-01-19 DIAGNOSIS — L570 Actinic keratosis: Secondary | ICD-10-CM | POA: Diagnosis not present

## 2015-01-19 DIAGNOSIS — Z08 Encounter for follow-up examination after completed treatment for malignant neoplasm: Secondary | ICD-10-CM | POA: Diagnosis not present

## 2015-01-19 DIAGNOSIS — D225 Melanocytic nevi of trunk: Secondary | ICD-10-CM | POA: Diagnosis not present

## 2015-01-19 DIAGNOSIS — J309 Allergic rhinitis, unspecified: Secondary | ICD-10-CM | POA: Diagnosis not present

## 2015-01-19 DIAGNOSIS — Z85828 Personal history of other malignant neoplasm of skin: Secondary | ICD-10-CM | POA: Diagnosis not present

## 2015-01-26 DIAGNOSIS — J309 Allergic rhinitis, unspecified: Secondary | ICD-10-CM | POA: Diagnosis not present

## 2015-02-02 DIAGNOSIS — J309 Allergic rhinitis, unspecified: Secondary | ICD-10-CM | POA: Diagnosis not present

## 2015-02-06 ENCOUNTER — Other Ambulatory Visit: Payer: Self-pay

## 2015-02-09 DIAGNOSIS — J309 Allergic rhinitis, unspecified: Secondary | ICD-10-CM | POA: Diagnosis not present

## 2015-02-14 DIAGNOSIS — H353190 Nonexudative age-related macular degeneration, unspecified eye, stage unspecified: Secondary | ICD-10-CM | POA: Insufficient documentation

## 2015-02-14 DIAGNOSIS — H353110 Nonexudative age-related macular degeneration, right eye, stage unspecified: Secondary | ICD-10-CM | POA: Diagnosis not present

## 2015-02-14 DIAGNOSIS — H401131 Primary open-angle glaucoma, bilateral, mild stage: Secondary | ICD-10-CM | POA: Diagnosis not present

## 2015-02-14 DIAGNOSIS — Z961 Presence of intraocular lens: Secondary | ICD-10-CM | POA: Diagnosis not present

## 2015-02-16 DIAGNOSIS — J309 Allergic rhinitis, unspecified: Secondary | ICD-10-CM | POA: Diagnosis not present

## 2015-02-23 DIAGNOSIS — J309 Allergic rhinitis, unspecified: Secondary | ICD-10-CM | POA: Diagnosis not present

## 2015-03-02 DIAGNOSIS — J309 Allergic rhinitis, unspecified: Secondary | ICD-10-CM | POA: Diagnosis not present

## 2015-03-03 ENCOUNTER — Encounter: Payer: Self-pay | Admitting: Gastroenterology

## 2015-03-03 ENCOUNTER — Other Ambulatory Visit: Payer: Self-pay

## 2015-03-03 ENCOUNTER — Ambulatory Visit (INDEPENDENT_AMBULATORY_CARE_PROVIDER_SITE_OTHER): Payer: Medicare Other | Admitting: Gastroenterology

## 2015-03-03 VITALS — BP 160/77 | HR 67 | Temp 97.7°F | Ht 68.0 in | Wt 144.8 lb

## 2015-03-03 DIAGNOSIS — K219 Gastro-esophageal reflux disease without esophagitis: Secondary | ICD-10-CM

## 2015-03-03 DIAGNOSIS — R1310 Dysphagia, unspecified: Secondary | ICD-10-CM | POA: Diagnosis not present

## 2015-03-03 DIAGNOSIS — I2510 Atherosclerotic heart disease of native coronary artery without angina pectoris: Secondary | ICD-10-CM

## 2015-03-03 MED ORDER — PANTOPRAZOLE SODIUM 40 MG PO TBEC: 40.0000 mg | DELAYED_RELEASE_TABLET | Freq: Two times a day (BID) | ORAL | Status: DC

## 2015-03-03 NOTE — Assessment & Plan Note (Signed)
81 year old gentleman with history of chronic GERD, Schatzki ring presents with complaints of odynophagia, esophageal burning. Symptoms present for 2 months. Really denies any esophageal dysphagia. No other alarm symptoms. Symptoms began after consuming sports drink regularly as well as acidic fruits.differential diagnosis includes possible reflux esophagitis, candida esophagitis, viral esophagitis. Does not sound like esophageal stricture based on symptoms. Offered upper endoscopy versus changing medication. Patient unclear which way to proceed. He is on the schedule for an upper endoscopy in approximately 3 weeks. If he has no significant improvement in his symptoms with pantoprazole 40 mg twice a day for 2 weeks we will continue with EGD. If he feels a lot better he can cancel procedure. Patient voices understanding. I have discussed the risks, alternatives, benefits with regards to but not limited to the risk of reaction to medication, bleeding, infection, perforation and the patient is agreeable to proceed. Written consent to be obtained.

## 2015-03-03 NOTE — Patient Instructions (Signed)
1. Increase your protonix to twice daily before breakfast and evening meal.  2. Call in two weeks and let me know if you are better. If not, we will proceed with upper endoscopy as scheduled. If you are better, then we will cancel the upper endoscopy.

## 2015-03-03 NOTE — Progress Notes (Signed)
Primary Care Physician:  Glo Herring., MD  Primary Gastroenterologist:  Garfield Cornea, MD   Chief Complaint  Patient presents with  . Dysphagia    since July    HPI:  Dale [name] is a 81 y.o. male here for follow-up. He has a history of GERD and Schatzki ring. Last seen in July of this year. Doing well at his last office visit.  Since last office visit he started having a lot of burning in his esophagus. Feels it mostly in the throat area. Initially noted it after eating pineapple about 2 months ago. Was also consuming a new sports drink that was helping with his leg cramps. Subsequently stopped both items. Continues to have burning in the throat on a daily basis although it is intermittent. Burning when he swallows food but no esophageal dysphagia. No nausea or vomiting. No weight loss. Denies abdominal pain. Bowel movements regular. No blood in the stool or melena. Denies any recent medication changes or antibiotics. Worries about having cancer.Currently on pantoprazole 40 mg daily. Previously did not feel any difference between Dexilant and pantoprazole.    Current Outpatient Prescriptions  Medication Sig Dispense Refill  . amLODipine (NORVASC) 5 MG tablet Take 1 tablet (5 mg total) by mouth daily. 30 tablet 3  . aspirin 81 MG tablet Take 81 mg by mouth. Every other day    . atorvastatin (LIPITOR) 40 MG tablet Take 1 tablet (40 mg total) by mouth daily. 90 tablet 3  . clopidogrel (PLAVIX) 75 MG tablet Take 1 tablet by mouth  daily 90 tablet 3  . dorzolamide (TRUSOPT) 2 % ophthalmic solution Place 1 drop into both eyes 2 (two) times daily.     Marland Kitchen guaiFENesin (MUCINEX) 600 MG 12 hr tablet Take 1,200 mg by mouth 2 (two) times daily.    . hydrochlorothiazide (HYDRODIURIL) 12.5 MG tablet Take 1 tablet by mouth once a day as needed for fluid   retention 90 tablet 2  . isosorbide mononitrate (IMDUR) 60 MG 24 hr tablet pt take 60 mg in morning and 30 mg at night 135 tablet 3  . latanoprost  (XALATAN) 0.005 % ophthalmic solution Place 1 drop into both eyes at bedtime.    Marland Kitchen levothyroxine (SYNTHROID, LEVOTHROID) 100 MCG tablet Take 1 tablet (100 mcg total) by mouth daily. 90 tablet 3  . losartan (COZAAR) 100 MG tablet Take 1 tablet by mouth  daily 90 tablet 3  . metoprolol tartrate (LOPRESSOR) 25 MG tablet Take one-half tablet by  mouth two times daily 90 tablet 3  . Multiple Vitamin (MULTIVITAMIN) tablet Take 1 tablet by mouth every evening.     . Multiple Vitamins-Minerals (PRESERVISION/LUTEIN) CAPS Take 1 capsule by mouth 2 (two) times daily.     . nitroGLYCERIN (NITROSTAT) 0.4 MG SL tablet Place 1 tablet (0.4 mg total) under the tongue every 5 (five) minutes as needed. For chest pain 25 tablet 2  . NONFORMULARY OR COMPOUNDED ITEM Allergy Vaccine 1:50 Given at Home    . ondansetron (ZOFRAN ODT) 4 MG disintegrating tablet 4mg  ODT q4 hours prn nausea/vomit 4 tablet 0  . pantoprazole (PROTONIX) 40 MG tablet Take 1 tablet by mouth  daily 90 tablet 3  . potassium chloride (K-DUR,KLOR-CON) 10 MEQ tablet Take 1 tablet by mouth  daily 90 tablet 0  . timolol (TIMOPTIC) 0.5 % ophthalmic solution Place 1 drop into both eyes daily.     Marland Kitchen triamcinolone (KENALOG) 0.1 % cream Apply topically 2 (two) times daily as needed.      Marland Kitchen  vitamin C (ASCORBIC ACID) 500 MG tablet Take 500 mg by mouth every evening.     . vitamin E 200 UNIT capsule Take 200 Units by mouth every evening.     . [DISCONTINUED] potassium chloride (K-DUR) 10 MEQ tablet Take 1 tablet (10 mEq total) by mouth daily. 90 tablet 3   No current facility-administered medications for this visit.    Allergies as of 03/03/2015 - Review Complete 03/03/2015  Allergen Reaction Noted  . Azithromycin Other (See Comments) 06/03/2013  . Doxazosin Shortness Of Breath 02/11/2012  . Acetaminophen    . Atenolol    . Hydrocodone Nausea And Vomiting 08/15/2011  . Levofloxacin Other (See Comments) 09/26/2014  . Morphine    . Penicillins Nausea  And Vomiting   . Sulfonamide derivatives Nausea And Vomiting     Past Medical History  Diagnosis Date  . Coronary heart disease     s/p stenting. cath in 01/2012 noncritical occlusion  . Idiopathic thrombocytopenic purpura ([place]) 2002  . Allergic rhinitis   . Sarcoidosis ([place])     pulmonary  . Hyperlipidemia   . Hypertension   . Macular degeneration   . Hypothyroidism   . Chest pain   . GERD (gastroesophageal reflux disease)   . CHF (congestive heart failure) ([place])   . Cancer ([place])     skin  . Asthma   . Glaucoma   . Nephrolithiasis   . PUD (peptic ulcer disease)     remote  . Schatzki's ring   . Hiatal hernia   . Anal fissure     Past Surgical History  Procedure Laterality Date  . Mediastinoscopy      for dx sarcoid  . Coronary angioplasty with stent placement    . Cardiac stents    . Esophagogastroduodenoscopy  06/19/2004    Two esophageal rings and esophageal web as described above.  All of these were disrupted by passing 56-French Venia Minks dilator/ Candida esophagitis,which appears to be incidental given history of   antibiotic use, but nevertheless will be treated.  . Esophagogastroduodenoscopy  10/30/2006    Distal tandem esophageal ring status post dilation disruption as  described above.  Otherwise normal esophagus/  Small hiatal hernia otherwise normal stomach, D1 and D2  . Colonoscopy  10/30/2006    Normal rectum, sigmoid diverticula.Remainder of colonic mucosa appeared normal.  . Esophagogastroduodenoscopy (egd) with esophageal dilation  03/04/2012    RMR- schatzki's ring, hiatal hernia, polypoid gastric mucosa, bx= minimally active gastritis.  . Left heart cath N/A 02/02/2012    Procedure: LEFT HEART CATH;  Surgeon: Lorretta Harp, MD;  Location: [facility] CATH LAB;  Service: Cardiovascular;  Laterality: N/A;    Family History  Problem Relation Age of Onset  . Heart disease Father     deceased age 57  . Stroke Mother   . Alzheimer's disease Mother   . Heart  attack Brother     deceased age 66  . Colon cancer Neg Hx   . Cancer Other     niece    Social History   Social History  . Marital Status: Married    Spouse Name: N/A  . Number of Children: 1  . Years of Education: N/A   Occupational History  . Retired     Visual merchandiser pumping station  .     Social History Main Topics  . Smoking status: Former Smoker -- 0.10 packs/day for 2 years    Types: Cigarettes, Cigars    Quit date: 05/06/1970  .  Smokeless tobacco: Never Used  . Alcohol Use: No  . Drug Use: No  . Sexual Activity: No   Other Topics Concern  . Not on file   Social History Narrative      ROS:  General: Negative for anorexia, weight loss, fever, chills, fatigue, weakness. Eyes: Negative for vision changes.  ENT: Negative for hoarseness, difficulty swallowing , nasal congestion. See hpi CV: Negative for chest pain, angina, palpitations, dyspnea on exertion, peripheral edema.  Respiratory: Negative for dyspnea at rest, dyspnea on exertion, cough, sputum, wheezing.  GI: See history of present illness. GU:  Negative for dysuria, hematuria, urinary incontinence, urinary frequency, nocturnal urination.  MS: Negative for joint pain, low back pain.  Derm: Negative for rash or itching.  Neuro: Negative for weakness, abnormal sensation, seizure, frequent headaches, memory loss, confusion.  Psych: Negative for anxiety, depression, suicidal ideation, hallucinations.  Endo: Negative for unusual weight change.  Heme: Negative for bruising or bleeding. Allergy: Negative for rash or hives.    Physical Examination:  BP 160/77 mmHg  Pulse 67  Temp(Src) 97.7 F (36.5 C) (Oral)  Ht 5\' 8"  (1.727 m)  Wt 144 lb 12.8 oz (65.681 kg)  BMI 22.02 kg/m2   General: Well-nourished, well-developed in no acute distress.  Head: Normocephalic, atraumatic.   Eyes: Conjunctiva pink, no icterus. Mouth: Oropharyngeal mucosa moist and pink , no lesions erythema or exudate. Neck: Supple  without thyromegaly, masses, or lymphadenopathy.  Lungs: Clear to auscultation bilaterally.  Heart: Regular rate and rhythm, no murmurs rubs or gallops.  Abdomen: Bowel sounds are normal, nontender, nondistended, no hepatosplenomegaly or masses, no abdominal bruits or    hernia , no rebound or guarding.   Rectal: not performed Extremities: No lower extremity edema. No clubbing or deformities.  Neuro: Alert and oriented x 4 , grossly normal neurologically.  Skin: Warm and dry, no rash or jaundice.   Psych: Alert and cooperative, normal mood and affect.  Labs: Lab Results  Component Value Date   WBC 4.1 07/07/2014   HGB 12.8 07/07/2014   HCT 40.4 07/07/2014   MCV 103* 07/07/2014   PLT 238 07/07/2014   Lab Results  Component Value Date   CREATININE 0.72* 07/07/2014   BUN 14 07/07/2014   NA 135 07/07/2014   K 4.5 07/07/2014   CL 96* 07/07/2014   CO2 25 07/07/2014   Lab Results  Component Value Date   ALT 19 07/07/2014   AST 22 07/07/2014   ALKPHOS 73 07/07/2014   BILITOT 0.4 07/07/2014     Imaging Studies: No results found.

## 2015-03-03 NOTE — Progress Notes (Signed)
cc'ed to pcp °

## 2015-03-05 DIAGNOSIS — H401131 Primary open-angle glaucoma, bilateral, mild stage: Secondary | ICD-10-CM | POA: Insufficient documentation

## 2015-03-05 DIAGNOSIS — Z961 Presence of intraocular lens: Secondary | ICD-10-CM | POA: Insufficient documentation

## 2015-03-07 DIAGNOSIS — H70001 Acute mastoiditis without complications, right ear: Secondary | ICD-10-CM | POA: Diagnosis not present

## 2015-03-09 DIAGNOSIS — J309 Allergic rhinitis, unspecified: Secondary | ICD-10-CM | POA: Diagnosis not present

## 2015-03-13 ENCOUNTER — Encounter: Payer: Self-pay | Admitting: Internal Medicine

## 2015-03-13 DIAGNOSIS — L0390 Cellulitis, unspecified: Secondary | ICD-10-CM | POA: Diagnosis not present

## 2015-03-13 DIAGNOSIS — D225 Melanocytic nevi of trunk: Secondary | ICD-10-CM | POA: Diagnosis not present

## 2015-03-16 DIAGNOSIS — J309 Allergic rhinitis, unspecified: Secondary | ICD-10-CM | POA: Diagnosis not present

## 2015-03-21 ENCOUNTER — Telehealth: Payer: Self-pay | Admitting: Internal Medicine

## 2015-03-21 NOTE — Telephone Encounter (Signed)
Noted  

## 2015-03-21 NOTE — Telephone Encounter (Signed)
Pt. Hasn't seen you since 06/03/13. You are booked into Feb.. Can I send his vac. This time and not send anymore until he makes an appt..& sees you? Please advise.

## 2015-03-21 NOTE — Telephone Encounter (Signed)
Ok to send vaccine  Dale [name], please help get him scheduled back in next month or 6 weeks

## 2015-03-21 NOTE — Telephone Encounter (Signed)
ATC patient-line busy; will need to call again Wednesday 03/22/15.

## 2015-03-22 ENCOUNTER — Telehealth: Payer: Self-pay | Admitting: Internal Medicine

## 2015-03-22 ENCOUNTER — Encounter (HOSPITAL_COMMUNITY): Payer: Self-pay | Admitting: *Deleted

## 2015-03-22 ENCOUNTER — Ambulatory Visit (HOSPITAL_COMMUNITY)
Admission: RE | Admit: 2015-03-22 | Discharge: 2015-03-22 | Disposition: A | Discharge status: Home / Self Care | Payer: Medicare Other | Source: Ambulatory Visit | Attending: Internal Medicine | Admitting: Internal Medicine

## 2015-03-22 ENCOUNTER — Encounter (HOSPITAL_COMMUNITY)
Admission: RE | Disposition: A | Discharge status: Home / Self Care | Payer: Self-pay | Source: Ambulatory Visit | Attending: Internal Medicine

## 2015-03-22 ENCOUNTER — Ambulatory Visit (INDEPENDENT_AMBULATORY_CARE_PROVIDER_SITE_OTHER): Payer: Medicare Other

## 2015-03-22 DIAGNOSIS — K222 Esophageal obstruction: Secondary | ICD-10-CM | POA: Insufficient documentation

## 2015-03-22 DIAGNOSIS — R1310 Dysphagia, unspecified: Secondary | ICD-10-CM | POA: Diagnosis not present

## 2015-03-22 DIAGNOSIS — I110 Hypertensive heart disease with heart failure: Secondary | ICD-10-CM | POA: Diagnosis not present

## 2015-03-22 DIAGNOSIS — D693 Immune thrombocytopenic purpura: Secondary | ICD-10-CM | POA: Diagnosis not present

## 2015-03-22 DIAGNOSIS — Z87891 Personal history of nicotine dependence: Secondary | ICD-10-CM | POA: Insufficient documentation

## 2015-03-22 DIAGNOSIS — K469 Unspecified abdominal hernia without obstruction or gangrene: Secondary | ICD-10-CM | POA: Diagnosis not present

## 2015-03-22 DIAGNOSIS — Z79899 Other long term (current) drug therapy: Secondary | ICD-10-CM | POA: Insufficient documentation

## 2015-03-22 DIAGNOSIS — J309 Allergic rhinitis, unspecified: Secondary | ICD-10-CM

## 2015-03-22 DIAGNOSIS — Z7982 Long term (current) use of aspirin: Secondary | ICD-10-CM | POA: Diagnosis not present

## 2015-03-22 DIAGNOSIS — Z7902 Long term (current) use of antithrombotics/antiplatelets: Secondary | ICD-10-CM | POA: Insufficient documentation

## 2015-03-22 DIAGNOSIS — K449 Diaphragmatic hernia without obstruction or gangrene: Secondary | ICD-10-CM | POA: Diagnosis not present

## 2015-03-22 DIAGNOSIS — K219 Gastro-esophageal reflux disease without esophagitis: Secondary | ICD-10-CM | POA: Diagnosis not present

## 2015-03-22 DIAGNOSIS — I2510 Atherosclerotic heart disease of native coronary artery without angina pectoris: Secondary | ICD-10-CM | POA: Diagnosis not present

## 2015-03-22 DIAGNOSIS — Q394 Esophageal web: Secondary | ICD-10-CM

## 2015-03-22 DIAGNOSIS — E785 Hyperlipidemia, unspecified: Secondary | ICD-10-CM | POA: Insufficient documentation

## 2015-03-22 DIAGNOSIS — Z955 Presence of coronary angioplasty implant and graft: Secondary | ICD-10-CM | POA: Diagnosis not present

## 2015-03-22 DIAGNOSIS — J45909 Unspecified asthma, uncomplicated: Secondary | ICD-10-CM | POA: Diagnosis not present

## 2015-03-22 HISTORY — PX: ESOPHAGOGASTRODUODENOSCOPY: SHX5428

## 2015-03-22 SURGERY — EGD (ESOPHAGOGASTRODUODENOSCOPY)
Anesthesia: Moderate Sedation

## 2015-03-22 MED ORDER — MIDAZOLAM HCL 5 MG/5ML IJ SOLN
INTRAMUSCULAR | Status: AC
  Filled 2015-03-22: qty 10

## 2015-03-22 MED ORDER — MEPERIDINE HCL 100 MG/ML IJ SOLN
INTRAMUSCULAR | Status: DC | PRN
  Administered 2015-03-22: 25 mg via INTRAVENOUS

## 2015-03-22 MED ORDER — ONDANSETRON HCL 4 MG/2ML IJ SOLN
INTRAMUSCULAR | Status: DC | PRN
  Administered 2015-03-22: 4 mg via INTRAVENOUS

## 2015-03-22 MED ORDER — LIDOCAINE VISCOUS 2 % MT SOLN
OROMUCOSAL | Status: DC | PRN
  Administered 2015-03-22: 1 via OROMUCOSAL

## 2015-03-22 MED ORDER — MEPERIDINE HCL 100 MG/ML IJ SOLN
INTRAMUSCULAR | Status: AC
  Filled 2015-03-22: qty 2

## 2015-03-22 MED ORDER — ONDANSETRON HCL 4 MG/2ML IJ SOLN
INTRAMUSCULAR | Status: AC
  Filled 2015-03-22: qty 2

## 2015-03-22 MED ORDER — SODIUM CHLORIDE 0.9 % IV SOLN
INTRAVENOUS | Status: DC
  Administered 2015-03-22: 09:00:00 via INTRAVENOUS

## 2015-03-22 MED ORDER — STERILE WATER FOR IRRIGATION IR SOLN
Status: DC | PRN
  Administered 2015-03-22: 2.5 mL

## 2015-03-22 MED ORDER — LIDOCAINE VISCOUS 2 % MT SOLN
OROMUCOSAL | Status: AC
  Filled 2015-03-22: qty 15

## 2015-03-22 MED ORDER — MIDAZOLAM HCL 5 MG/5ML IJ SOLN
INTRAMUSCULAR | Status: DC | PRN
  Administered 2015-03-22: 2 mg via INTRAVENOUS
  Administered 2015-03-22: 1 mg via INTRAVENOUS

## 2015-03-22 NOTE — Telephone Encounter (Signed)
LMTCB-pt can be scheduled with CY for yearly follow up (and to remain getting his allergy vaccine) on 05/03/15 at 11:15am.

## 2015-03-22 NOTE — Op Note (Signed)
[facility] [address] [place], [address]   1ENDOSCOPY PROCEDURE REPORT  PATIENT: Dale, [name]  MR#: [id number] BIRTHDATE: [birth year] , 47  yrs. old GENDER: male ENDOSCOPIST: R.  Garfield Cornea, MD FACP FACG REFERRED BY:  Kerin Perna, M.D. PROCEDURE DATE:  03/28/2015 PROCEDURE:  EGD, diagnostic INDICATIONS:  vague odynophagia (burning throat).Denies dysphagia MEDICATIONS: Versed 3 mg IV and Demerol 25 mg IV in divided doses. Zofran 4 mg IV.  Xylocaine gel orally. ASA CLASS:      Class II  CONSENT: The risks, benefits, limitations, alternatives and imponderables have been discussed.  The potential for biopsy, esophogeal dilation, etc. have also been reviewed.  Questions have been answered.  All parties agreeable.  Please see the history and physical in the medical record for more information.  DESCRIPTION OF PROCEDURE: After the risks benefits and alternatives of the procedure were thoroughly explained, informed consent was obtained.  The [id number] endoscope was introduced through the mouth and advanced to the second portion of the duodenum , limited by Without limitations. The instrument was slowly withdrawn as the mucosa was fully examined. Estimated blood loss is zero unless otherwise noted in this procedure report.    Hypopharynx, tubular esophagus appeared entirely normal aside from a noncritical Schatzki's ring.  Stomach empty. 2 cm hiatal hernia. Normal appearing gastric mucosa. Patent pylorus. Normal-appearing first and second portion of the duodenum.          The scope was then withdrawn from the patient and the procedure completed.  COMPLICATIONS: There were no immediate complications.  ENDOSCOPIC IMPRESSION: Noncritical Schatzki's ring and hiatal hernia?"otherwise normal EGD. No endoscopic explanation for "burning throat". I wonder if patient did not get transient chemical/pill-induced esophageal injury. Potassium chloride tablets  recently discontinued.  RECOMMENDATIONS: Continue Protonix 40 mg daily. Add Carafate suspension 4 times a day -   10 days. Swallowing precautions reviewed with spouse. If burning throat symptoms persist, may benefit from ENT evaluation.   REPEAT EXAM:  eSigned:  R. Garfield Cornea, MD Rosalita Chessman [facility] 03/28/2015 10:44 AM    CC:  CPT CODES: ICD CODES:  The ICD and CPT codes recommended by this software are interpretations from the data that the clinical staff has captured with the software.  The verification of the translation of this report to the ICD and CPT codes and modifiers is the sole responsibility of the health care institution and practicing physician where this report was generated.  [employer] will not be held responsible for the validity of the ICD and CPT codes included on this report.  AMA assumes no liability for data contained or not contained herein. CPT is a Designer, television/film set of the [employer].  PATIENT NAME:  Dale, [name] MR#: [id number]

## 2015-03-22 NOTE — Telephone Encounter (Signed)
As appt is scheduled, will send to [facility] to handle vaccine part of msg.

## 2015-03-22 NOTE — Interval H&P Note (Signed)
History and Physical Interval Note:  03/22/2015 9:59 AM  Dale [name]  has presented today for surgery, with the diagnosis of GERD,Odynophagia  The various methods of treatment have been discussed with the patient and family. After consideration of risks, benefits and other options for treatment, the patient has consented to  Procedure(s) with comments: ESOPHAGOGASTRODUODENOSCOPY (EGD) (N/A) - 1300 - moved to 11/16 @ 9:30 - office to notify as a surgical intervention .  The patient's history has been reviewed, patient examined, no change in status, stable for surgery.  I have reviewed the patient's chart and labs.  Questions were answered to the patient's satisfaction.     [name]  Patient reconfirms no dysphagia whatsoever. Some odynophagia. Diagnostic EGD per plan. The risks, benefits, limitations, alternatives and imponderables have been reviewed with the patient. Potential for esophageal dilation, biopsy, etc. have also been reviewed.  Questions have been answered. All parties agreeable.

## 2015-03-22 NOTE — Telephone Encounter (Signed)
Patient returned call and scheduled appt for 05/03/15 at 11:15 am.

## 2015-03-22 NOTE — Discharge Instructions (Signed)
EGD Discharge instructions Please read the instructions outlined below and refer to this sheet in the next few weeks. These discharge instructions provide you with general information on caring for yourself after you leave the hospital. Your doctor may also give you specific instructions. While your treatment has been planned according to the most current medical practices available, unavoidable complications occasionally occur. If you have any problems or questions after discharge, please call your doctor. ACTIVITY  You may resume your regular activity but move at a slower pace for the next 24 hours.   Take frequent rest periods for the next 24 hours.   Walking will help expel (get rid of) the air and reduce the bloated feeling in your abdomen.   No driving for 24 hours (because of the anesthesia (medicine) used during the test).   You may shower.   Do not sign any important legal documents or operate any machinery for 24 hours (because of the anesthesia used during the test).  NUTRITION  Drink plenty of fluids.   You may resume your normal diet.   Begin with a light meal and progress to your normal diet.   Avoid alcoholic beverages for 24 hours or as instructed by your caregiver.  MEDICATIONS  You may resume your normal medications unless your caregiver tells you otherwise.  WHAT YOU CAN EXPECT TODAY  You may experience abdominal discomfort such as a feeling of fullness or gas pains.  FOLLOW-UP  Your doctor will discuss the results of your test with you.  SEEK IMMEDIATE MEDICAL ATTENTION IF ANY OF THE FOLLOWING OCCUR:  Excessive nausea (feeling sick to your stomach) and/or vomiting.   Severe abdominal pain and distention (swelling).   Trouble swallowing.   Temperature over 101 F (37.8 C).   Rectal bleeding or vomiting of blood.    Continue Protonix 40 mg daily  Add Carafate suspension 1 g 4 times daily 10 days  Swallowing precautions reviewed with spouse  today.  If burning throat persists, may need to see the ear nose and throat specialist.  Dysphagia Swallowing problems (dysphagia) occur when solids and liquids seem to stick in your throat on the way down to your stomach, or the food takes longer to get to the stomach. Other symptoms include regurgitating food, noises coming from the throat, chest discomfort with swallowing, and a feeling of fullness or the feeling of something being stuck in your throat when swallowing. When blockage in your throat is complete, it may be associated with drooling. CAUSES  Problems with swallowing may occur because of problems with the muscles. The food cannot be propelled in the usual manner into your stomach. You may have ulcers, scar tissue, or inflammation in the tube down which food travels from your mouth to your stomach (esophagus), which blocks food from passing normally into the stomach. Causes of inflammation include: Acid reflux from your stomach into your esophagus. Infection. Radiation treatment for cancer. Medicines taken without enough fluids to wash them down into your stomach. You may have nerve problems that prevent signals from being sent to the muscles of your esophagus to contract and move your food down to your stomach. Globus pharyngeus is a relatively common problem in which there is a sense of an obstruction or difficulty in swallowing, without any physical abnormalities of the swallowing passages being found. This problem usually improves over time with reassurance and testing to rule out other causes. DIAGNOSIS Dysphagia can be diagnosed and its cause can be determined by tests in  which you swallow a white substance that helps illuminate the inside of your throat (contrast medium) while X-rays are taken. Sometimes a flexible telescope that is inserted down your throat (endoscopy) to look at your esophagus and stomach is used. TREATMENT  If the dysphagia is caused by acid reflux or  infection, medicines may be used. If the dysphagia is caused by problems with your swallowing muscles, swallowing therapy may be used to help you strengthen your swallowing muscles. If the dysphagia is caused by a blockage or mass, procedures to remove the blockage may be done. HOME CARE INSTRUCTIONS Try to eat soft food that is easier to swallow and check your weight on a daily basis to be sure that it is not decreasing. Be sure to drink liquids when sitting upright (not lying down). SEEK MEDICAL CARE IF: You are losing weight because you are unable to swallow. You are coughing when you drink liquids (aspiration). You are coughing up partially digested food. SEEK IMMEDIATE MEDICAL CARE IF: You are unable to swallow your own saliva . You are having shortness of breath or a fever, or both. You have a hoarse voice along with difficulty swallowing. MAKE SURE YOU: Understand these instructions. Will watch your condition. Will get help right away if you are not doing well or get worse.   This information is not intended to replace advice given to you by your health care provider. Make sure you discuss any questions you have with your health care provider.   Document Released: 04/19/2000 Document Revised: 05/13/2014 Document Reviewed: 10/09/2012 Elsevier Interactive Patient Education [employer].

## 2015-03-22 NOTE — Telephone Encounter (Signed)
Allergy Serum Extract Date Mixed: 03/22/2015 Vial: A Strength: 1:50 Here/Mail/Pick Up: Mail Mixed By: Desmond Dike, CMA Last OV: 06/03/2013 Pending OV: None

## 2015-03-22 NOTE — H&P (View-Only) (Signed)
Primary Care Physician:  FUSCO,LAWRENCE J., MD  Primary Gastroenterologist:  Michael Rourk, MD   Chief Complaint  Patient presents with  . Dysphagia    since July    HPI:  Dale [name] is a 81 y.o. male here for follow-up. He has a history of GERD and Schatzki ring. Last seen in July of this year. Doing well at his last office visit.  Since last office visit he started having a lot of burning in his esophagus. Feels it mostly in the throat area. Initially noted it after eating pineapple about 2 months ago. Was also consuming a new sports drink that was helping with his leg cramps. Subsequently stopped both items. Continues to have burning in the throat on a daily basis although it is intermittent. Burning when he swallows food but no esophageal dysphagia. No nausea or vomiting. No weight loss. Denies abdominal pain. Bowel movements regular. No blood in the stool or melena. Denies any recent medication changes or antibiotics. Worries about having cancer.Currently on pantoprazole 40 mg daily. Previously did not feel any difference between Dexilant and pantoprazole.    Current Outpatient Prescriptions  Medication Sig Dispense Refill  . amLODipine (NORVASC) 5 MG tablet Take 1 tablet (5 mg total) by mouth daily. 30 tablet 3  . aspirin 81 MG tablet Take 81 mg by mouth. Every other day    . atorvastatin (LIPITOR) 40 MG tablet Take 1 tablet (40 mg total) by mouth daily. 90 tablet 3  . clopidogrel (PLAVIX) 75 MG tablet Take 1 tablet by mouth  daily 90 tablet 3  . dorzolamide (TRUSOPT) 2 % ophthalmic solution Place 1 drop into both eyes 2 (two) times daily.     . guaiFENesin (MUCINEX) 600 MG 12 hr tablet Take 1,200 mg by mouth 2 (two) times daily.    . hydrochlorothiazide (HYDRODIURIL) 12.5 MG tablet Take 1 tablet by mouth once a day as needed for fluid   retention 90 tablet 2  . isosorbide mononitrate (IMDUR) 60 MG 24 hr tablet pt take 60 mg in morning and 30 mg at night 135 tablet 3  . latanoprost  (XALATAN) 0.005 % ophthalmic solution Place 1 drop into both eyes at bedtime.    . levothyroxine (SYNTHROID, LEVOTHROID) 100 MCG tablet Take 1 tablet (100 mcg total) by mouth daily. 90 tablet 3  . losartan (COZAAR) 100 MG tablet Take 1 tablet by mouth  daily 90 tablet 3  . metoprolol tartrate (LOPRESSOR) 25 MG tablet Take one-half tablet by  mouth two times daily 90 tablet 3  . Multiple Vitamin (MULTIVITAMIN) tablet Take 1 tablet by mouth every evening.     . Multiple Vitamins-Minerals (PRESERVISION/LUTEIN) CAPS Take 1 capsule by mouth 2 (two) times daily.     . nitroGLYCERIN (NITROSTAT) 0.4 MG SL tablet Place 1 tablet (0.4 mg total) under the tongue every 5 (five) minutes as needed. For chest pain 25 tablet 2  . NONFORMULARY OR COMPOUNDED ITEM Allergy Vaccine 1:50 Given at Home    . ondansetron (ZOFRAN ODT) 4 MG disintegrating tablet 4mg ODT q4 hours prn nausea/vomit 4 tablet 0  . pantoprazole (PROTONIX) 40 MG tablet Take 1 tablet by mouth  daily 90 tablet 3  . potassium chloride (K-DUR,KLOR-CON) 10 MEQ tablet Take 1 tablet by mouth  daily 90 tablet 0  . timolol (TIMOPTIC) 0.5 % ophthalmic solution Place 1 drop into both eyes daily.     . triamcinolone (KENALOG) 0.1 % cream Apply topically 2 (two) times daily as needed.      .   vitamin C (ASCORBIC ACID) 500 MG tablet Take 500 mg by mouth every evening.     . vitamin E 200 UNIT capsule Take 200 Units by mouth every evening.     . [DISCONTINUED] potassium chloride (K-DUR) 10 MEQ tablet Take 1 tablet (10 mEq total) by mouth daily. 90 tablet 3   No current facility-administered medications for this visit.    Allergies as of 03/03/2015 - Review Complete 03/03/2015  Allergen Reaction Noted  . Azithromycin Other (See Comments) 06/03/2013  . Doxazosin Shortness Of Breath 02/11/2012  . Acetaminophen    . Atenolol    . Hydrocodone Nausea And Vomiting 08/15/2011  . Levofloxacin Other (See Comments) 09/26/2014  . Morphine    . Penicillins Nausea  And Vomiting   . Sulfonamide derivatives Nausea And Vomiting     Past Medical History  Diagnosis Date  . Coronary heart disease     s/p stenting. cath in 01/2012 noncritical occlusion  . Idiopathic thrombocytopenic purpura (HCC) 2002  . Allergic rhinitis   . Sarcoidosis (HCC)     pulmonary  . Hyperlipidemia   . Hypertension   . Macular degeneration   . Hypothyroidism   . Chest pain   . GERD (gastroesophageal reflux disease)   . CHF (congestive heart failure) (HCC)   . Cancer (HCC)     skin  . Asthma   . Glaucoma   . Nephrolithiasis   . PUD (peptic ulcer disease)     remote  . Schatzki's ring   . Hiatal hernia   . Anal fissure     Past Surgical History  Procedure Laterality Date  . Mediastinoscopy      for dx sarcoid  . Coronary angioplasty with stent placement    . Cardiac stents    . Esophagogastroduodenoscopy  06/19/2004    Two esophageal rings and esophageal web as described above.  All of these were disrupted by passing 56-French Maloney dilator/ Candida esophagitis,which appears to be incidental given history of   antibiotic use, but nevertheless will be treated.  . Esophagogastroduodenoscopy  10/30/2006    Distal tandem esophageal ring status post dilation disruption as  described above.  Otherwise normal esophagus/  Small hiatal hernia otherwise normal stomach, D1 and D2  . Colonoscopy  10/30/2006    Normal rectum, sigmoid diverticula.Remainder of colonic mucosa appeared normal.  . Esophagogastroduodenoscopy (egd) with esophageal dilation  03/04/2012    RMR- schatzki's ring, hiatal hernia, polypoid gastric mucosa, bx= minimally active gastritis.  . Left heart cath N/A 02/02/2012    Procedure: LEFT HEART CATH;  Surgeon: Jonathan J Berry, MD;  Location: [facility] CATH LAB;  Service: Cardiovascular;  Laterality: N/A;    Family History  Problem Relation Age of Onset  . Heart disease Father     deceased age 70  . Stroke Mother   . Alzheimer's disease Mother   . Heart  attack Brother     deceased age 48  . Colon cancer Neg Hx   . Cancer Other     niece    Social History   Social History  . Marital Status: Married    Spouse Name: N/A  . Number of Children: 1  . Years of Education: N/A   Occupational History  . 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     Social History Main Topics  . Smoking status: Former Smoker -- 0.10 packs/day for 2 years    Types: Cigarettes, Cigars    Quit date: 05/06/1970  .   Smokeless tobacco: Never Used  . Alcohol Use: No  . Drug Use: No  . Sexual Activity: No   Other Topics Concern  . Not on file   Social History Narrative      ROS:  General: Negative for anorexia, weight loss, fever, chills, fatigue, weakness. Eyes: Negative for vision changes.  ENT: Negative for hoarseness, difficulty swallowing , nasal congestion. See hpi CV: Negative for chest pain, angina, palpitations, dyspnea on exertion, peripheral edema.  Respiratory: Negative for dyspnea at rest, dyspnea on exertion, cough, sputum, wheezing.  GI: See history of present illness. GU:  Negative for dysuria, hematuria, urinary incontinence, urinary frequency, nocturnal urination.  MS: Negative for joint pain, low back pain.  Derm: Negative for rash or itching.  Neuro: Negative for weakness, abnormal sensation, seizure, frequent headaches, memory loss, confusion.  Psych: Negative for anxiety, depression, suicidal ideation, hallucinations.  Endo: Negative for unusual weight change.  Heme: Negative for bruising or bleeding. Allergy: Negative for rash or hives.    Physical Examination:  BP 160/77 mmHg  Pulse 67  Temp(Src) 97.7 F (36.5 C) (Oral)  Ht 5' 8" (1.727 m)  Wt 144 lb 12.8 oz (65.681 kg)  BMI 22.02 kg/m2   General: Well-nourished, well-developed in no acute distress.  Head: Normocephalic, atraumatic.   Eyes: Conjunctiva pink, no icterus. Mouth: Oropharyngeal mucosa moist and pink , no lesions erythema or exudate. Neck: Supple  without thyromegaly, masses, or lymphadenopathy.  Lungs: Clear to auscultation bilaterally.  Heart: Regular rate and rhythm, no murmurs rubs or gallops.  Abdomen: Bowel sounds are normal, nontender, nondistended, no hepatosplenomegaly or masses, no abdominal bruits or    hernia , no rebound or guarding.   Rectal: not performed Extremities: No lower extremity edema. No clubbing or deformities.  Neuro: Alert and oriented x 4 , grossly normal neurologically.  Skin: Warm and dry, no rash or jaundice.   Psych: Alert and cooperative, normal mood and affect.  Labs: Lab Results  Component Value Date   WBC 4.1 07/07/2014   HGB 12.8 07/07/2014   HCT 40.4 07/07/2014   MCV 103* 07/07/2014   PLT 238 07/07/2014   Lab Results  Component Value Date   CREATININE 0.72* 07/07/2014   BUN 14 07/07/2014   NA 135 07/07/2014   K 4.5 07/07/2014   CL 96* 07/07/2014   CO2 25 07/07/2014   Lab Results  Component Value Date   ALT 19 07/07/2014   AST 22 07/07/2014   ALKPHOS 73 07/07/2014   BILITOT 0.4 07/07/2014     Imaging Studies: No results found.    

## 2015-03-23 DIAGNOSIS — J309 Allergic rhinitis, unspecified: Secondary | ICD-10-CM | POA: Diagnosis not present

## 2015-03-23 NOTE — Telephone Encounter (Signed)
Ria Comment made pt.'s vac. Yesterday. I was off. Nothing further needed.

## 2015-03-27 DIAGNOSIS — L723 Sebaceous cyst: Secondary | ICD-10-CM | POA: Diagnosis not present

## 2015-03-28 ENCOUNTER — Encounter (HOSPITAL_COMMUNITY): Payer: Self-pay | Admitting: Internal Medicine

## 2015-03-28 ENCOUNTER — Ambulatory Visit (INDEPENDENT_AMBULATORY_CARE_PROVIDER_SITE_OTHER): Payer: Medicare Other | Admitting: Cardiovascular Disease

## 2015-03-28 VITALS — BP 128/66 | HR 55 | Ht 68.0 in | Wt 144.6 lb

## 2015-03-28 DIAGNOSIS — E0590 Thyrotoxicosis, unspecified without thyrotoxic crisis or storm: Secondary | ICD-10-CM

## 2015-03-28 DIAGNOSIS — Q394 Esophageal web: Secondary | ICD-10-CM

## 2015-03-28 DIAGNOSIS — K222 Esophageal obstruction: Secondary | ICD-10-CM

## 2015-03-28 DIAGNOSIS — E785 Hyperlipidemia, unspecified: Secondary | ICD-10-CM

## 2015-03-28 DIAGNOSIS — I25810 Atherosclerosis of coronary artery bypass graft(s) without angina pectoris: Secondary | ICD-10-CM | POA: Diagnosis not present

## 2015-03-28 DIAGNOSIS — I10 Essential (primary) hypertension: Secondary | ICD-10-CM | POA: Diagnosis not present

## 2015-03-28 DIAGNOSIS — I440 Atrioventricular block, first degree: Secondary | ICD-10-CM

## 2015-03-28 MED ORDER — NITROGLYCERIN 0.4 MG SL SUBL: 0.4000 mg | SUBLINGUAL_TABLET | SUBLINGUAL | Status: DC | PRN

## 2015-03-28 NOTE — Patient Instructions (Signed)
Your physician wants you to follow-up in: 6 MONTHS OR SOONER IF NEEDED. You will receive a reminder letter in the mail two months in advance. If you don't receive a letter, please call our office to schedule the follow-up appointment.  Your physician recommends that you return for lab work in: 6 months. Slab slips will be sent to you when it's time to have blood drawn.

## 2015-03-29 DIAGNOSIS — J309 Allergic rhinitis, unspecified: Secondary | ICD-10-CM | POA: Diagnosis not present

## 2015-03-30 ENCOUNTER — Encounter: Payer: Self-pay | Admitting: Cardiovascular Disease

## 2015-03-30 NOTE — Progress Notes (Signed)
Patient ID: Dale [name], male   DOB: February 23, 1929, 81 y.o.   MRN: [id number]    Primary M.D.: Dr. Redmond School  HPI: Dale [name] is a 81 y.o. male who presents to the office for a 6 month cardiology evaluation.  Dale [name] has known CAD and underwent initial intervention to his RCA in 2000. In 2009 a Cypher stent was placed beyond the acute margin of his RCA. His last cardiac catheterization was in September 2013 by Dr. [name] which showed 30% LAD narrowing after the second diagonal vessel, and normal left circumflex coronary artery, and patent RCA stents.  He has a history of hypothyroidism on Synthroid replacement and has a history of hyperlipidemia. He had been on on Crestor 5 mg plus Zetia 10 mg; however, due to recent increased cost of Zetia, this was discontinued and he has been on Crestor 20 mg daily.  He also has a history of GERD treated with Protonix, and he continues to be on dual antiplatelet therapy. He is responsive to Plavix on previous P2 Y12 testing.  When I last saw him 6 months ago he stated that he had experienced 3-4 episodes of chest pain over the six-month period.  Most of these episodes have occurred at night.  However, they have been nitrate responsive.  He states that they were similar to his previous discomfort.  When I saw him, I further titrated his isosorbide mononitrate to 60 mg in the morning and 30 mg at night.  He denies any definitive exertional precipitation of chest pain.  But at times he does admit to some chest wall-like discomfort, particularly when he loses arms up over his head.  He has a history of hypertension and recently has been maintained on losartan 100 mg, metoprolol, tartrate 12.5 mg twice a day, and HCTZ 12.5 mg.    Since I last saw him, he has been stable without development of significant chest pain or shortness of breath.  He states that over the past 6 months, he has taken approximately 2 several nitroglycerin.  Last week he underwent  endoscopy by Dr. Buford Dresser in [place].  He has noticed trace edema to his ankles.  He also admits to postnasal drip and he has been taking Mucinex for congestion.  He presents for cardiology evaluation.  Past Medical History  Diagnosis Date  . Coronary heart disease     s/p stenting. cath in 01/2012 noncritical occlusion  . Idiopathic thrombocytopenic purpura ([place]) 2002  . Allergic rhinitis   . Sarcoidosis ([place])     pulmonary  . Hyperlipidemia   . Hypertension   . Macular degeneration   . Hypothyroidism   . Chest pain   . GERD (gastroesophageal reflux disease)   . CHF (congestive heart failure) ([place])   . Cancer ([place])     skin  . Asthma   . Glaucoma   . Nephrolithiasis   . PUD (peptic ulcer disease)     remote  . Schatzki's ring   . Hiatal hernia   . Anal fissure     Past Surgical History  Procedure Laterality Date  . Mediastinoscopy      for dx sarcoid  . Coronary angioplasty with stent placement    . Cardiac stents    . Esophagogastroduodenoscopy  06/19/2004    Two esophageal rings and esophageal web as described above.  All of these were disrupted by passing 56-French Venia Minks dilator/ Candida esophagitis,which appears to be incidental given history of   antibiotic use, but  nevertheless will be treated.  . Esophagogastroduodenoscopy  10/30/2006    Distal tandem esophageal ring status post dilation disruption as  described above.  Otherwise normal esophagus/  Small hiatal hernia otherwise normal stomach, D1 and D2  . Colonoscopy  10/30/2006    Normal rectum, sigmoid diverticula.Remainder of colonic mucosa appeared normal.  . Esophagogastroduodenoscopy (egd) with esophageal dilation  03/04/2012    RMR- schatzki's ring, hiatal hernia, polypoid gastric mucosa, bx= minimally active gastritis.  . Left heart cath N/A 02/02/2012    Procedure: LEFT HEART CATH;  Surgeon: Lorretta Harp, MD;  Location: [facility] CATH LAB;  Service: Cardiovascular;  Laterality: N/A;  .  Esophagogastroduodenoscopy N/A 03/22/2015    Procedure: ESOPHAGOGASTRODUODENOSCOPY (EGD);  Surgeon: Daneil Dolin, MD;  Location: [facility] ENDO SUITE;  Service: Endoscopy;  Laterality: N/A;  1300 - moved to 11/16 @ 9:30 - office to notify    Allergies  Allergen Reactions  . Azithromycin Other (See Comments)    Sore mouth and fever blisters around mouth, sores in nose area as well  . Doxazosin Shortness Of Breath  . Acetaminophen     REACTION: UNKNOWN REACTION  . Atenolol     REACTION: UNKNOWN REACTION  . Hydrocodone Nausea And Vomiting  . Levofloxacin Other (See Comments)    Caused stomach problems.  . Morphine     "made me crazy"  . Penicillins Nausea And Vomiting  . Sulfonamide Derivatives Nausea And Vomiting    Current Outpatient Prescriptions  Medication Sig Dispense Refill  . amLODipine (NORVASC) 5 MG tablet Take 1 tablet (5 mg total) by mouth daily. 30 tablet 3  . aspirin 81 MG tablet Take 81 mg by mouth. Every other day    . atorvastatin (LIPITOR) 40 MG tablet Take 1 tablet (40 mg total) by mouth daily. 90 tablet 3  . clopidogrel (PLAVIX) 75 MG tablet Take 1 tablet by mouth  daily 90 tablet 3  . dorzolamide (TRUSOPT) 2 % ophthalmic solution Place 1 drop into both eyes 2 (two) times daily.     Marland Kitchen guaiFENesin (MUCINEX) 600 MG 12 hr tablet Take 1,200 mg by mouth daily.     . hydrochlorothiazide (HYDRODIURIL) 12.5 MG tablet Take 1 tablet by mouth once a day as needed for fluid   retention 90 tablet 2  . isosorbide mononitrate (IMDUR) 60 MG 24 hr tablet pt take 60 mg in morning and 30 mg at night 135 tablet 3  . latanoprost (XALATAN) 0.005 % ophthalmic solution Place 1 drop into both eyes at bedtime.    Marland Kitchen levothyroxine (SYNTHROID, LEVOTHROID) 100 MCG tablet Take 1 tablet (100 mcg total) by mouth daily. 90 tablet 3  . losartan (COZAAR) 100 MG tablet Take 1 tablet by mouth  daily 90 tablet 3  . metoprolol tartrate (LOPRESSOR) 25 MG tablet Take one-half tablet by  mouth two times daily 90  tablet 3  . Multiple Vitamin (MULTIVITAMIN) tablet Take 1 tablet by mouth daily.     . Multiple Vitamins-Minerals (PRESERVISION/LUTEIN) CAPS Take 1 capsule by mouth 2 (two) times daily.     . nitroGLYCERIN (NITROSTAT) 0.4 MG SL tablet Place 1 tablet (0.4 mg total) under the tongue every 5 (five) minutes as needed. For chest pain 25 tablet 2  . NONFORMULARY OR COMPOUNDED ITEM Allergy Vaccine 1:50 Given at PCP office    . ondansetron (ZOFRAN ODT) 4 MG disintegrating tablet 47m ODT q4 hours prn nausea/vomit 4 tablet 0  . pantoprazole (PROTONIX) 40 MG tablet Take 1 tablet (40 mg  total) by mouth 2 (two) times daily before a meal. 180 tablet 0  . potassium chloride (K-DUR,KLOR-CON) 10 MEQ tablet Take 1 tablet by mouth  daily 90 tablet 0  . timolol (TIMOPTIC) 0.5 % ophthalmic solution Place 1 drop into both eyes daily.     Marland Kitchen triamcinolone (KENALOG) 0.1 % cream Apply topically 2 (two) times daily as needed.      . vitamin C (ASCORBIC ACID) 500 MG tablet Take 500 mg by mouth daily.     . vitamin E 200 UNIT capsule Take 200 Units by mouth every evening.     . [DISCONTINUED] potassium chloride (K-DUR) 10 MEQ tablet Take 1 tablet (10 mEq total) by mouth daily. 90 tablet 3   No current facility-administered medications for this visit.    Social History   Social History  . Marital Status: Married    Spouse Name: N/A  . Number of Children: 1  . Years of Education: N/A   Occupational History  . Retired     Visual merchandiser pumping station  .     Social History Main Topics  . Smoking status: Former Smoker -- 0.10 packs/day for 2 years    Types: Cigarettes, Cigars    Quit date: 05/06/1970  . Smokeless tobacco: Never Used  . Alcohol Use: No  . Drug Use: No  . Sexual Activity: No   Other Topics Concern  . Not on file   Social History Narrative   Additional social history is notable that he is married and lives with his wife. He quit smoking over 40 years ago. Has one child. He remains  active.  Family History  Problem Relation Age of Onset  . Heart disease Father     deceased age 21  . Stroke Mother   . Alzheimer's disease Mother   . Heart attack Brother     deceased age 16  . Colon cancer Neg Hx   . Cancer Other     niece   ROS General: Negative; No fevers, chills, or night sweats;  HEENT: Negative; No changes in vision or hearing, sinus congestion, difficulty swallowing Pulmonary: Negative; No cough, wheezing, shortness of breath, hemoptysis Cardiovascular: See history of present illness GI: Negative; No nausea, vomiting, diarrhea, or abdominal pain GU: Negative; No dysuria, hematuria, or difficulty voiding Musculoskeletal: Negative; no myalgias, joint pain, or weakness Hematologic/Oncology: Negative; no easy bruising, bleeding Endocrine: Positive for hypothyroidism; no diabetes Neuro: Negative; no changes in balance, headaches Skin: Negative; No rashes or skin lesions Psychiatric: Negative; No behavioral problems, depression Sleep: Negative; No snoring, daytime sleepiness, hypersomnolence, bruxism, restless legs, hypnogognic hallucinations, no cataplexy Other comprehensive 14 point system review is negative.   PE BP 128/66 mmHg  Pulse 55  Ht '5\' 8"'  (1.727 m)  Wt 144 lb 9.6 oz (65.59 kg)  BMI 21.99 kg/m2   Wt Readings from Last 3 Encounters:  03/28/15 144 lb 9.6 oz (65.59 kg)  03/22/15 135 lb (61.236 kg)  03/03/15 144 lb 12.8 oz (65.681 kg)   General: Alert, oriented, no distress.  Skin: Area of ecchymosis in a triangular fashion under the sternal notch in an area where he had rubbed.  Also significant ecchymoses to his arms bilaterally HEENT: Normocephalic, atraumatic. Pupils round and reactive; sclera anicteric;no lid lag.  Nose without nasal septal hypertrophy Mouth/Parynx benign; Mallinpatti scale 2 Neck: No JVD, no carotid bruits with normal carotid upstroke Lungs: clear to ausculatation and percussion; no wheezing or rales Chest wall:  Nontender to palpation; specifically, no  costochondral tenderness. Heart: RRR, s1 s2 normal 1/6 systolic murmur, unchanged. No S3 gallop no diastolic murmur, no rubs thrills or heaves Abdomen: soft, nontender; no hepatosplenomehaly, BS+; abdominal aorta nontender and not dilated by palpation. Back: No CVA tenderness Pulses 2+ Extremities: Trace ankle edema,  no clubbing cyanosis, Homan's sign negative  Neurologic: grossly nonfocal Psychologic: normal affect and mood.  ECG (independently read by me): Sinus bradycardia at 55 beats per minute with first-degree AV block.  PR interval 246 ms.  No significant ST-T changes.  May 2016 ECG (independently read by me): Sinus rhythm with first-degree heart block.  PR interval 226.  No ST segment changes.  February 2016 ECG (independently read by me): Sinus rhythm at 65 bpm.  Mild first-degree AV block with a PR interval 228 ms.  No significant ST segment changes.  December 2015 ECG (independently read by me): Sinus bradycardia 57 bpm.  Borderline first-degree AV block with a PR interval 216 daily.  Seconds.  QTc interval is normal at 373 ms.  June 2015 ECG (independently read by me): Sinus bradycardia 54 beats per minute.  First degree block with a PR interval of 232 ms.  No significant ST-T changes.  Prior ECG: Sinus bradycardia 54 beats per minute. PR interval 206 ms. QTc interval normal  LABS: BMP Latest Ref Rng 07/07/2014 05/11/2013 02/03/2012  Glucose 65 - 99 mg/dL 95 127(H) 125(H)  BUN 8 - 27 mg/dL '14 14 16  ' Creatinine 0.76 - 1.27 mg/dL 0.72(L) 0.76 0.83  BUN/Creat Ratio 10 - 22 19 - -  Sodium 134 - 144 mmol/L 135 136(L) 134(L)  Potassium 3.5 - 5.2 mmol/L 4.5 3.9 3.5  Chloride 97 - 108 mmol/L 96(L) 99 101  CO2 18 - 29 mmol/L '25 27 27  ' Calcium 8.6 - 10.2 mg/dL 9.3 9.2 8.6   Hepatic Function Latest Ref Rng 07/07/2014 05/11/2013 09/05/2009  Total Protein 6.0 - 8.5 g/dL 6.5 6.7 6.3  Albumin 3.5 - 4.7 g/dL 4.2 3.6 3.6  AST 0 - 40 IU/L '22 19 29  ' ALT 0  - 44 IU/L '19 26 29  ' Alk Phosphatase 39 - 117 IU/L 73 74 71  Total Bilirubin 0.0 - 1.2 mg/dL 0.4 0.4 0.9  Bilirubin, Direct - - - -   CBC Latest Ref Rng 07/07/2014 05/11/2013 02/03/2012  WBC 3.4 - 10.8 x10E3/uL 4.1 7.3 10.5  Hemoglobin 12.6 - 17.7 g/dL 12.8 12.8(L) 11.0(L)  Hematocrit 37.5 - 51.0 % 40.4 38.2(L) 33.4(L)  Platelets 150 - 379 x10E3/uL 238 157 158   Lab Results  Component Value Date   MCV 103* 07/07/2014   MCV 101.6* 05/11/2013   MCV 97.4 02/03/2012   Lab Results  Component Value Date   TSH 0.726 07/07/2014  No results found for: HGBA1C   Lipid Panel     Component Value Date/Time   CHOL 131 07/07/2014 0954   CHOL 107 08/15/2011 0555   TRIG 75 07/07/2014 0954   HDL 60 07/07/2014 0954   HDL 41 08/15/2011 0555   CHOLHDL 2.2 07/07/2014 0954   CHOLHDL 2.6 08/15/2011 0555   VLDL 15 08/15/2011 0555   LDLCALC 56 07/07/2014 0954   LDLCALC 51 08/15/2011 0555   RADIOLOGY: No results found.    ASSESSMENT AND PLAN: Dale [name] is an 81 years old gentleman who is  status post initial intervention to his RCA in 2000.Marland Kitchen He required subsequent intervention in 2009. At last catheterization in 2013 his RCA stents were patent and he had mild 30% LAD narrowing.  Earlier this year, I added amlodipine 5 mg to his medical regimen, both for blood pressure control and potential anti-anginal benefit.  He now has been on an increased dose of Crestor 20 mg and is tolerating this.  His most recent LDL cholesterol was excellent at 56.  He denies myalgias.  His blood pressure today is stable.  Repeat be my by me was 126/70 on his regimen consisting of amlodipine 5 mg, and 100 mg, Lopressor 12.5 no grams twice a day in addition to his isosorbide mononitrate 69 g in the morning and 30 mg at night.  He apparently has been taking supplemental potassium and I suggested he discontinue this.  He recently underwent endoscopy and was found to have a noncritical Schatzki's ring and hiatal hernia.  I  personally reviewed the endoscopy report.  He admits to postnasal drip.  I have suggested over-the-counter Claritin or Zyrtec.  His ECG remains stable.  He is continuing on Protonix for GERD.  As long as he remains stable I will see him in 6 months for reevaluation, and prior to that office visit follow-up laboratory will be obtained.  Time spent: 25 minutes Troy Sine, MD, [facility]  03/30/2015 9:03 AM

## 2015-04-01 ENCOUNTER — Other Ambulatory Visit: Payer: Self-pay | Admitting: Cardiovascular Disease

## 2015-04-03 NOTE — Telephone Encounter (Signed)
Rx(s) sent to pharmacy electronically.  

## 2015-04-06 DIAGNOSIS — J309 Allergic rhinitis, unspecified: Secondary | ICD-10-CM | POA: Diagnosis not present

## 2015-04-13 ENCOUNTER — Other Ambulatory Visit: Payer: Self-pay | Admitting: Cardiovascular Disease

## 2015-04-13 DIAGNOSIS — J309 Allergic rhinitis, unspecified: Secondary | ICD-10-CM | POA: Diagnosis not present

## 2015-04-13 NOTE — Telephone Encounter (Signed)
REFILL 

## 2015-04-17 DIAGNOSIS — Z23 Encounter for immunization: Secondary | ICD-10-CM | POA: Diagnosis not present

## 2015-04-20 DIAGNOSIS — J309 Allergic rhinitis, unspecified: Secondary | ICD-10-CM | POA: Diagnosis not present

## 2015-04-27 DIAGNOSIS — J309 Allergic rhinitis, unspecified: Secondary | ICD-10-CM | POA: Diagnosis not present

## 2015-05-03 ENCOUNTER — Encounter: Payer: Self-pay | Admitting: Internal Medicine

## 2015-05-03 ENCOUNTER — Ambulatory Visit: Payer: Medicare Other | Admitting: Internal Medicine

## 2015-05-03 VITALS — BP 130/60 | HR 63 | Ht 69.0 in | Wt 144.0 lb

## 2015-05-03 DIAGNOSIS — D869 Sarcoidosis, unspecified: Secondary | ICD-10-CM

## 2015-05-03 NOTE — Progress Notes (Signed)
Subjective:    Patient ID: Dale [name], male    DOB: Jul 05, 1928, 81 y.o.   MRN: [id number]  HPI 12/27/10-81 year old male never smoker followed for Allergic rhinitis, asthma,  hx sarcoid, glaucoma, history ITP Last here Sep 21, 2009-note reviewed. Allergy vaccine - continues long term. We discussed option to stop or retest.  CO throat tickle at night. Eating or drink, or melting ice in mouth will break it loose. Takes daily mucinex. Feels travatan eye drop get into nose and cause some swelling  02/10/12- 81 year old male never smoker followed for Allergic rhinitis, asthma,  hx sarcoid, complicated by glaucoma, history ITP Still on allergy vaccine 1:50 G0 and doing well with it; C/O yellow thick phelgm in throat Wants to wait on flu vaccine. In May had right hydropneumothorax on CT scan. Last week was in [place] for 2 days with left anterior chest pain/heart cath-"not heart". Some throat clearing of yellow phlegm but otherwise feels well. Had been having reflux. Dr Orson Ape treated Z-Pak, ending today. CT chest 10/01/11-reviewed with him IMPRESSION:  1. No evidence of pulmonary embolism. Mildly motion degraded  exam.  2. Development of a right-sided hydropneumothorax, small volume.  Critical test results telephoned to [facility], rn. at the time of  interpretation at 12:38 p.m. on 10/01/2011.  3. Findings of sarcoidosis, similar to on the prior exam. No  evidence of superimposed pneumonia or airspace disease.  4. Pulmonary artery enlargement suggests pulmonary arterial  hypertension.  Original Report Authenticated By: Areta Haber, M.D.  CXR 02/02/12-reviewed with him IMPRESSION:  Stable parenchymal densities in the right lung and right hilar  region. Findings are most likely secondary to sarcoidosis.  No evidence for pulmonary edema.  Original Report Authenticated By: Markus Daft, M.D.   08/10/12- 81 year old male never smoker followed for Allergic rhinitis, asthma,  hx sarcoid,  complicated by glaucoma, history ITP FOLLOWS FOR: still on allergy vaccine 1:50 GO and denies any flare ups at this time. Doxycycline at last office visit cleared his bronchitis. He is comfortable so far in the early spring. CXR 02/12/12 IMPRESSION:  Stable exam. No new or progressive findings.  Original Report Authenticated By: ERIC A. MANSELL, M.D.  02/18/13- 81 year old male never smoker followed for Allergic rhinitis, asthma,  hx sarcoid, Hx R hydropneumothorax 2013- torn adhesion, complicated by glaucoma, history ITP ACUTE VISIT: nasal congestion and burning("more less water/drainage"). allergy vaccine 1:50 GO Still feels this is ok and helpful Not a cold  06/03/13- 81 year old male never smoker followed for Allergic rhinitis, asthma,  hx Sarcoid, Hx R hydropneumothorax 2013- torn adhesion, complicated by glaucoma, history ITP ACUTE VISIT: patient states he started hurting in his lower back area on Saturday. Denies any SOB or wheezing. Has had a hard month-had the flu and medication reaction. Has had 2 rds of Levaquin (10 tablets each time). Z pak caused facial rash.  Had a lot of coughing w/ flu- now resolved. Now notes aching R infrascapular pain, comes and goes, not clearly positional. No dysuria. No SOB. Pain reminds him of when he tore adhesion and had PTX 2013. CXR 05/11/13 IMPRESSION:  No acute finding. Changes consistent with sarcoidosis appear stable  compared to the prior exams.  Electronically Signed  By: Inge Rise M.D.  On: 05/11/2013 15:53  05/03/2015-81 year old male never smoker followed for allergic rhinitis, asthma, history Sarcoid, history right hydropneumothorax [id number] adhesion, complicated by glaucoma, history ITP  Allergy vaccine 1:50 at [place]: patient states still suffering from low back pain and wears brace  daily. Denies SOB or wheezing. No complaints voiced    CT chest 06/30/2013 IMPRESSION: 1. Normal. Right ribs. 2. 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 3. Pulmonary parenchymal scarring tethered to the pleural surface posterior laterally at approximately the posterior aspect of the right sixth and seventh ribs. Electronically Signed  By: Rozetta Nunnery M.D.  On: 06/30/2013 14:13  Review of Systems- see HPI Constitutional:   No-   weight loss, night sweats, fevers, chills, fatigue, lassitude. HEENT:   No-  headaches, difficulty swallowing, tooth/dental problems, sore throat,       No-  sneezing, itching, ear ache, + nasal congestion, +post nasal drip,  CV:  +chest pain- HPI, orthopnea, PND, swelling in lower extremities, anasarca, dizziness, palpitations Resp: No-acute shortness of breath with exertion or at rest.             No- productive cough,  No non-productive cough,  No-  coughing up of blood.              No-   change in color of mucus.  No- wheezing.   Skin: No-   rash or lesions. GI:  No-   heartburn, indigestion, abdominal pain, nausea, vomiting,  GU:  MS:  No-   joint pain or swelling.  + backpain Neuro- nothing unusual  Psych:  No- change in mood or affect. No depression or anxiety.  No memory loss.    Objective:   Physical Exam General- Alert, Oriented, Affect-appropriate, Distress- none acute. Lean,   comfortable appearing elderly man Skin- rash-none, lesions- none, excoriation- none Lymphadenopathy- none Head- atraumatic            Eyes- Gross vision intact, PERRLA, conjunctivae clear secretions            Ears- Hearing- mildly reduced, some scarring right TM            Nose- Clear, no- Septal dev, , polyps, erosion, perforation             Throat- Mallampati II , mucosa clear , drainage- none, tonsils- atrophic. edentulous Neck- flexible , trachea midline, no stridor , thyroid nl, carotid no bruit Chest - symmetrical excursion , unlabored           Heart/CV- RRR w/ extra beats , no murmur , no gallop  , no rub, nl s1 s2                           - JVD- none , edema- none, stasis  changes- none, varices- none           Lung- clear to P&A, wheeze- none, cough-none , dullness-none, rub- none           Chest wall- +no shock tenderness R mid/lower back Abd-  Br/ Gen/ Rectal- Not done, not indicated Extrem- cyanosis- none, clubbing, none, atrophy- none, strength- nl Neuro- grossly intact to observation    Assessment & Plan:

## 2015-05-03 NOTE — Patient Instructions (Signed)
Suggest you get your allergy shots every other week now. We can stop them completely at any time you feel ready.  Please cal as needed

## 2015-05-04 DIAGNOSIS — J309 Allergic rhinitis, unspecified: Secondary | ICD-10-CM | POA: Diagnosis not present

## 2015-05-08 ENCOUNTER — Other Ambulatory Visit: Payer: Self-pay | Admitting: Cardiovascular Disease

## 2015-05-09 NOTE — Telephone Encounter (Signed)
Rx request sent to pharmacy.  

## 2015-05-15 ENCOUNTER — Other Ambulatory Visit: Payer: Self-pay | Admitting: Cardiovascular Disease

## 2015-05-15 NOTE — Telephone Encounter (Signed)
Rx(s) sent to pharmacy electronically.  

## 2015-05-18 DIAGNOSIS — J309 Allergic rhinitis, unspecified: Secondary | ICD-10-CM | POA: Diagnosis not present

## 2015-05-23 DIAGNOSIS — H40053 Ocular hypertension, bilateral: Secondary | ICD-10-CM | POA: Diagnosis not present

## 2015-05-23 DIAGNOSIS — Z961 Presence of intraocular lens: Secondary | ICD-10-CM | POA: Diagnosis not present

## 2015-06-02 DIAGNOSIS — D49512 Neoplasm of unspecified behavior of left kidney: Secondary | ICD-10-CM | POA: Diagnosis not present

## 2015-06-02 DIAGNOSIS — N2889 Other specified disorders of kidney and ureter: Secondary | ICD-10-CM | POA: Diagnosis not present

## 2015-06-02 DIAGNOSIS — D49519 Neoplasm of unspecified behavior of unspecified kidney: Secondary | ICD-10-CM | POA: Diagnosis not present

## 2015-06-02 DIAGNOSIS — J309 Allergic rhinitis, unspecified: Secondary | ICD-10-CM | POA: Diagnosis not present

## 2015-06-05 ENCOUNTER — Other Ambulatory Visit: Payer: Self-pay | Admitting: Cardiovascular Disease

## 2015-06-05 DIAGNOSIS — R3912 Poor urinary stream: Secondary | ICD-10-CM | POA: Diagnosis not present

## 2015-06-05 DIAGNOSIS — R310 Gross hematuria: Secondary | ICD-10-CM | POA: Diagnosis not present

## 2015-06-05 DIAGNOSIS — D49512 Neoplasm of unspecified behavior of left kidney: Secondary | ICD-10-CM | POA: Diagnosis not present

## 2015-06-05 DIAGNOSIS — R351 Nocturia: Secondary | ICD-10-CM | POA: Diagnosis not present

## 2015-06-05 DIAGNOSIS — Z0000 Encounter for general adult medical examination without abnormal findings: Secondary | ICD-10-CM | POA: Diagnosis not present

## 2015-06-05 NOTE — Telephone Encounter (Signed)
Rx request sent to pharmacy.  

## 2015-06-06 ENCOUNTER — Other Ambulatory Visit: Payer: Self-pay | Admitting: *Deleted

## 2015-06-06 MED ORDER — LOSARTAN POTASSIUM 100 MG PO TABS: 100.0000 mg | ORAL_TABLET | Freq: Every day | ORAL | Status: DC

## 2015-06-13 DIAGNOSIS — J069 Acute upper respiratory infection, unspecified: Secondary | ICD-10-CM | POA: Diagnosis not present

## 2015-06-13 DIAGNOSIS — J029 Acute pharyngitis, unspecified: Secondary | ICD-10-CM | POA: Diagnosis not present

## 2015-06-15 DIAGNOSIS — J309 Allergic rhinitis, unspecified: Secondary | ICD-10-CM | POA: Diagnosis not present

## 2015-06-22 DIAGNOSIS — H1131 Conjunctival hemorrhage, right eye: Secondary | ICD-10-CM | POA: Diagnosis not present

## 2015-06-22 DIAGNOSIS — H04123 Dry eye syndrome of bilateral lacrimal glands: Secondary | ICD-10-CM | POA: Diagnosis not present

## 2015-06-22 DIAGNOSIS — H40053 Ocular hypertension, bilateral: Secondary | ICD-10-CM | POA: Diagnosis not present

## 2015-06-26 ENCOUNTER — Telehealth: Payer: Self-pay | Admitting: Cardiovascular Disease

## 2015-06-26 NOTE — Telephone Encounter (Signed)
Patient walked in and states he needs clearance to hold plavix for biopsy. Upon further questioning, patient states he has seen his kidney MD and he has a spot on his left kidney that the MD said they would monitor again in 1 year but patient wants a biopsy done. Patient states he is going to see if Dr. Tammi Klippel(?) can do the biopsy for him. This procedure has not yet been ordered. Informed patient generally we cannot clear them for procedures until they are ordered by an MD and arranged, but he would still like MD to review the situation and see if he can hold plavix and for how long if he is able to get a biopsy done.   Message routed to [name] and Mariann Laster, CMA to advise

## 2015-06-27 NOTE — Telephone Encounter (Signed)
OK to hold plavix for 5 days when procedure is to be done

## 2015-06-28 NOTE — Telephone Encounter (Signed)
Patient called and notified of MD advice. Voiced understanding.

## 2015-06-30 DIAGNOSIS — J309 Allergic rhinitis, unspecified: Secondary | ICD-10-CM | POA: Diagnosis not present

## 2015-07-13 DIAGNOSIS — J309 Allergic rhinitis, unspecified: Secondary | ICD-10-CM | POA: Diagnosis not present

## 2015-07-13 DIAGNOSIS — Z1389 Encounter for screening for other disorder: Secondary | ICD-10-CM | POA: Diagnosis not present

## 2015-07-13 DIAGNOSIS — Z0000 Encounter for general adult medical examination without abnormal findings: Secondary | ICD-10-CM | POA: Diagnosis not present

## 2015-07-13 DIAGNOSIS — Z6820 Body mass index (BMI) 20.0-20.9, adult: Secondary | ICD-10-CM | POA: Diagnosis not present

## 2015-07-25 ENCOUNTER — Ambulatory Visit (INDEPENDENT_AMBULATORY_CARE_PROVIDER_SITE_OTHER): Payer: Medicare Other | Admitting: Urology

## 2015-07-25 DIAGNOSIS — R3912 Poor urinary stream: Secondary | ICD-10-CM | POA: Diagnosis not present

## 2015-07-25 DIAGNOSIS — C641 Malignant neoplasm of right kidney, except renal pelvis: Secondary | ICD-10-CM

## 2015-07-27 DIAGNOSIS — J309 Allergic rhinitis, unspecified: Secondary | ICD-10-CM | POA: Diagnosis not present

## 2015-08-03 DIAGNOSIS — C44229 Squamous cell carcinoma of skin of left ear and external auricular canal: Secondary | ICD-10-CM | POA: Diagnosis not present

## 2015-08-03 DIAGNOSIS — D225 Melanocytic nevi of trunk: Secondary | ICD-10-CM | POA: Diagnosis not present

## 2015-08-03 DIAGNOSIS — L570 Actinic keratosis: Secondary | ICD-10-CM | POA: Diagnosis not present

## 2015-08-03 DIAGNOSIS — C44329 Squamous cell carcinoma of skin of other parts of face: Secondary | ICD-10-CM | POA: Diagnosis not present

## 2015-08-03 DIAGNOSIS — X32XXXD Exposure to sunlight, subsequent encounter: Secondary | ICD-10-CM | POA: Diagnosis not present

## 2015-08-10 DIAGNOSIS — J309 Allergic rhinitis, unspecified: Secondary | ICD-10-CM | POA: Diagnosis not present

## 2015-08-24 DIAGNOSIS — J309 Allergic rhinitis, unspecified: Secondary | ICD-10-CM | POA: Diagnosis not present

## 2015-08-30 ENCOUNTER — Telehealth: Payer: Self-pay | Admitting: Internal Medicine

## 2015-08-30 DIAGNOSIS — J309 Allergic rhinitis, unspecified: Secondary | ICD-10-CM

## 2015-08-30 NOTE — Telephone Encounter (Signed)
Allergy Serum Extract Date Mixed: 08/30/15 Vial: 1 Strength: 1:50 Here/Mail/Pick Up: mail Mixed By: tbs Last OV: 05/03/15 Pending OV: 05/02/16

## 2015-08-31 ENCOUNTER — Telehealth: Payer: Self-pay | Admitting: Internal Medicine

## 2015-08-31 MED ORDER — PANTOPRAZOLE SODIUM 40 MG PO TBEC: 40.0000 mg | DELAYED_RELEASE_TABLET | Freq: Two times a day (BID) | ORAL | Status: DC

## 2015-08-31 NOTE — Telephone Encounter (Signed)
PATIENT NEEDS PROTONIX FOR 3 MONTHS CALLED INTO HIS MAIL ORDER PHARMACY

## 2015-08-31 NOTE — Telephone Encounter (Signed)
Please tell the patient I sent in the Rx to his pharmacy as requested.

## 2015-08-31 NOTE — Telephone Encounter (Signed)
Routing to the refill box. 

## 2015-08-31 NOTE — Addendum Note (Signed)
Addended by: Gordy Levan, ERIC A on: 08/31/2015 03:42 PM   Modules accepted: Orders

## 2015-08-31 NOTE — Telephone Encounter (Signed)
Pt aware.

## 2015-09-05 ENCOUNTER — Encounter: Payer: Self-pay | Admitting: *Deleted

## 2015-09-05 DIAGNOSIS — I10 Essential (primary) hypertension: Secondary | ICD-10-CM

## 2015-09-05 DIAGNOSIS — E0590 Thyrotoxicosis, unspecified without thyrotoxic crisis or storm: Secondary | ICD-10-CM

## 2015-09-05 DIAGNOSIS — I25810 Atherosclerosis of coronary artery bypass graft(s) without angina pectoris: Secondary | ICD-10-CM

## 2015-09-05 DIAGNOSIS — E785 Hyperlipidemia, unspecified: Secondary | ICD-10-CM

## 2015-09-08 ENCOUNTER — Other Ambulatory Visit: Payer: Self-pay | Admitting: Cardiovascular Disease

## 2015-09-08 DIAGNOSIS — E785 Hyperlipidemia, unspecified: Secondary | ICD-10-CM | POA: Diagnosis not present

## 2015-09-08 DIAGNOSIS — E0590 Thyrotoxicosis, unspecified without thyrotoxic crisis or storm: Secondary | ICD-10-CM | POA: Diagnosis not present

## 2015-09-08 DIAGNOSIS — I10 Essential (primary) hypertension: Secondary | ICD-10-CM | POA: Diagnosis not present

## 2015-09-08 DIAGNOSIS — I25810 Atherosclerosis of coronary artery bypass graft(s) without angina pectoris: Secondary | ICD-10-CM | POA: Diagnosis not present

## 2015-09-08 DIAGNOSIS — J309 Allergic rhinitis, unspecified: Secondary | ICD-10-CM | POA: Diagnosis not present

## 2015-09-09 LAB — CBC WITH DIFFERENTIAL/PLATELET

## 2015-09-09 LAB — LIPID PANEL W/O CHOL/HDL RATIO
Cholesterol, Total: 127 mg/dL (ref 100–199)
HDL: 48 mg/dL (ref >39)
LDL CALC: 61 mg/dL (ref 0–99)
Triglycerides: 89 mg/dL (ref 0–149)
VLDL CHOLESTEROL CAL: 18 mg/dL (ref 5–40)

## 2015-09-09 LAB — COMPREHENSIVE METABOLIC PANEL
ALBUMIN: 3.9 g/dL (ref 3.5–4.7)
ALT: 20 IU/L (ref 0–44)
AST: 24 IU/L (ref 0–40)
Albumin/Globulin Ratio: 1.6 (ref 1.2–2.2)
Alkaline Phosphatase: 76 IU/L (ref 39–117)
BUN / CREAT RATIO: 18 (ref 10–24)
BUN: 14 mg/dL (ref 8–27)
Bilirubin Total: 0.7 mg/dL (ref 0.0–1.2)
CALCIUM: 9.1 mg/dL (ref 8.6–10.2)
CHLORIDE: 102 mmol/L (ref 96–106)
CO2: 24 mmol/L (ref 18–29)
CREATININE: 0.79 mg/dL (ref 0.76–1.27)
GFR, EST AFRICAN AMERICAN: 95 mL/min/{1.73_m2} (ref >59)
GFR, EST NON AFRICAN AMERICAN: 82 mL/min/{1.73_m2} (ref >59)
GLUCOSE: 99 mg/dL (ref 65–99)
Globulin, Total: 2.4 g/dL (ref 1.5–4.5)
Potassium: 4.6 mmol/L (ref 3.5–5.2)
Sodium: 141 mmol/L (ref 134–144)
TOTAL PROTEIN: 6.3 g/dL (ref 6.0–8.5)

## 2015-09-09 LAB — TSH: TSH: 1.55 u[IU]/mL (ref 0.450–4.500)

## 2015-09-11 ENCOUNTER — Other Ambulatory Visit: Payer: Self-pay | Admitting: *Deleted

## 2015-09-11 MED ORDER — AMLODIPINE BESYLATE 5 MG PO TABS: 5.0000 mg | ORAL_TABLET | Freq: Every day | ORAL | Status: DC

## 2015-09-11 NOTE — Telephone Encounter (Signed)
Rx(s) sent to pharmacy electronically.  

## 2015-09-12 DIAGNOSIS — Z961 Presence of intraocular lens: Secondary | ICD-10-CM | POA: Diagnosis not present

## 2015-09-12 DIAGNOSIS — H353130 Nonexudative age-related macular degeneration, bilateral, stage unspecified: Secondary | ICD-10-CM | POA: Diagnosis not present

## 2015-09-12 DIAGNOSIS — H401131 Primary open-angle glaucoma, bilateral, mild stage: Secondary | ICD-10-CM | POA: Diagnosis not present

## 2015-09-13 ENCOUNTER — Telehealth: Payer: Self-pay | Admitting: Cardiovascular Disease

## 2015-09-14 DIAGNOSIS — L570 Actinic keratosis: Secondary | ICD-10-CM | POA: Diagnosis not present

## 2015-09-14 DIAGNOSIS — Z08 Encounter for follow-up examination after completed treatment for malignant neoplasm: Secondary | ICD-10-CM | POA: Diagnosis not present

## 2015-09-14 DIAGNOSIS — Z85828 Personal history of other malignant neoplasm of skin: Secondary | ICD-10-CM | POA: Diagnosis not present

## 2015-09-14 DIAGNOSIS — X32XXXD Exposure to sunlight, subsequent encounter: Secondary | ICD-10-CM | POA: Diagnosis not present

## 2015-09-14 NOTE — Telephone Encounter (Signed)
Close encounter 

## 2015-09-15 ENCOUNTER — Telehealth: Payer: Self-pay | Admitting: *Deleted

## 2015-09-15 ENCOUNTER — Other Ambulatory Visit: Payer: Self-pay

## 2015-09-15 MED ORDER — AMLODIPINE BESYLATE 5 MG PO TABS: 5.0000 mg | ORAL_TABLET | Freq: Every day | ORAL | Status: DC

## 2015-09-15 NOTE — Telephone Encounter (Signed)
Patients wife called and stated that [facility] claims that they will not send the amlodipine rx to the patient until they hear from someone at the office due to some discrepancies with his dob. Please advise as the wife stated that he needs this medication. Thanks, MI

## 2015-09-16 ENCOUNTER — Other Ambulatory Visit: Payer: Self-pay | Admitting: Pharmacist Clinician (PhC)/ Clinical Pharmacy Specialist

## 2015-09-16 ENCOUNTER — Other Ambulatory Visit: Payer: Self-pay | Admitting: Cardiovascular Disease

## 2015-09-18 NOTE — Telephone Encounter (Signed)
Rx request sent to pharmacy.  

## 2015-09-22 DIAGNOSIS — J309 Allergic rhinitis, unspecified: Secondary | ICD-10-CM | POA: Diagnosis not present

## 2015-10-06 DIAGNOSIS — J309 Allergic rhinitis, unspecified: Secondary | ICD-10-CM | POA: Diagnosis not present

## 2015-10-07 ENCOUNTER — Other Ambulatory Visit: Payer: Self-pay | Admitting: Cardiovascular Disease

## 2015-10-09 NOTE — Telephone Encounter (Signed)
Rx(s) sent to pharmacy electronically.  

## 2015-10-11 ENCOUNTER — Encounter: Payer: Self-pay | Admitting: Internal Medicine

## 2015-10-18 ENCOUNTER — Encounter: Payer: Self-pay | Admitting: *Deleted

## 2015-10-18 ENCOUNTER — Telehealth: Payer: Self-pay | Admitting: *Deleted

## 2015-10-18 DIAGNOSIS — Z961 Presence of intraocular lens: Secondary | ICD-10-CM | POA: Diagnosis not present

## 2015-10-18 DIAGNOSIS — H40053 Ocular hypertension, bilateral: Secondary | ICD-10-CM | POA: Diagnosis not present

## 2015-10-18 NOTE — Telephone Encounter (Signed)
-----   Message from Troy Sine, MD sent at 10/15/2015 11:41 PM EDT ----- Labs ok

## 2015-10-18 NOTE — Telephone Encounter (Signed)
Opened in error

## 2015-10-26 DIAGNOSIS — J309 Allergic rhinitis, unspecified: Secondary | ICD-10-CM | POA: Diagnosis not present

## 2015-11-09 DIAGNOSIS — J309 Allergic rhinitis, unspecified: Secondary | ICD-10-CM | POA: Diagnosis not present

## 2015-11-23 DIAGNOSIS — J309 Allergic rhinitis, unspecified: Secondary | ICD-10-CM | POA: Diagnosis not present

## 2015-11-25 ENCOUNTER — Other Ambulatory Visit: Payer: Self-pay | Admitting: Cardiovascular Disease

## 2015-12-07 DIAGNOSIS — J309 Allergic rhinitis, unspecified: Secondary | ICD-10-CM | POA: Diagnosis not present

## 2015-12-12 ENCOUNTER — Encounter: Payer: Self-pay | Admitting: Internal Medicine

## 2015-12-12 ENCOUNTER — Ambulatory Visit (INDEPENDENT_AMBULATORY_CARE_PROVIDER_SITE_OTHER): Payer: Medicare Other | Admitting: Internal Medicine

## 2015-12-12 VITALS — BP 171/70 | HR 66 | Temp 96.9°F | Ht 68.0 in | Wt 136.8 lb

## 2015-12-12 DIAGNOSIS — K219 Gastro-esophageal reflux disease without esophagitis: Secondary | ICD-10-CM | POA: Diagnosis not present

## 2015-12-12 DIAGNOSIS — I25810 Atherosclerosis of coronary artery bypass graft(s) without angina pectoris: Secondary | ICD-10-CM | POA: Diagnosis not present

## 2015-12-12 DIAGNOSIS — R1310 Dysphagia, unspecified: Secondary | ICD-10-CM | POA: Diagnosis not present

## 2015-12-12 NOTE — Progress Notes (Signed)
Primary Care Physician:  Glo Herring., MD Primary Gastroenterologist:  Dr. Gala Romney Pre-Procedure History & Physical: HPI:  Dale [name] is a 82 y.o. male here for one-year followup GERD/dysphagia. History Schatzki's ring status post dilation previously. Doing well on Protonix 40 mg once daily. Was previously bumped up to twice a day the patient states he could not tell any difference and back off to once daily. Was on Carafate briefly as well.  No recurrent esophageal dysphagia.  He tells me the urologist is following a lesion in his left kidney.  Past Medical History:  Diagnosis Date  . Allergic rhinitis   . Anal fissure   . Asthma   . Cancer ([place])    skin  . Chest pain   . CHF (congestive heart failure) ([place])   . Coronary heart disease    s/p stenting. cath in 01/2012 noncritical occlusion  . GERD (gastroesophageal reflux disease)   . Glaucoma   . Hiatal hernia   . Hyperlipidemia   . Hypertension   . Hypothyroidism   . Idiopathic thrombocytopenic purpura ([place]) 2002  . Macular degeneration   . Nephrolithiasis   . PUD (peptic ulcer disease)    remote  . Sarcoidosis ([place])    pulmonary  . Schatzki's ring     Past Surgical History:  Procedure Laterality Date  . cardiac stents    . COLONOSCOPY  10/30/2006   Normal rectum, sigmoid diverticula.Remainder of colonic mucosa appeared normal.  . CORONARY ANGIOPLASTY WITH STENT PLACEMENT    . ESOPHAGOGASTRODUODENOSCOPY  06/19/2004   Two esophageal rings and esophageal web as described above.  All of these were disrupted by passing 56-French Venia Minks dilator/ Candida esophagitis,which appears to be incidental given history of   antibiotic use, but nevertheless will be treated.  . ESOPHAGOGASTRODUODENOSCOPY  10/30/2006   Distal tandem esophageal ring status post dilation disruption as  described above.  Otherwise normal esophagus/  Small hiatal hernia otherwise normal stomach, D1 and D2  . ESOPHAGOGASTRODUODENOSCOPY N/A  03/22/2015   Dr.Sharyah Bostwick- noncritical schatzki's ring and hiatal hernia-o/w normal EGD.   Marland Kitchen ESOPHAGOGASTRODUODENOSCOPY (EGD) WITH ESOPHAGEAL DILATION  03/04/2012   RMR- schatzki's ring, hiatal hernia, polypoid gastric mucosa, bx= minimally active gastritis.  Marland Kitchen LEFT HEART CATH N/A 02/02/2012   Procedure: LEFT HEART CATH;  Surgeon: Lorretta Harp, MD;  Location: [facility] CATH LAB;  Service: Cardiovascular;  Laterality: N/A;  . MEDIASTINOSCOPY     for dx sarcoid    Prior to Admission medications   Medication Sig Start Date End Date Taking? Authorizing Provider  amLODipine (NORVASC) 5 MG tablet Take 1 tablet (5 mg total) by mouth daily. 09/15/15  Yes Troy Sine, MD  aspirin 81 MG tablet Take 81 mg by mouth. Every other day   Yes Historical Provider, MD  atorvastatin (LIPITOR) 40 MG tablet Take 1 tablet by mouth  daily 09/18/15  Yes Troy Sine, MD  clopidogrel (PLAVIX) 75 MG tablet Take 1 tablet by mouth  daily 10/09/15  Yes Troy Sine, MD  dorzolamide (TRUSOPT) 2 % ophthalmic solution Place 1 drop into both eyes 2 (two) times daily.    Yes Historical Provider, MD  finasteride (PROSCAR) 5 MG tablet Take 5 mg by mouth daily.   Yes Historical Provider, MD  guaiFENesin (MUCINEX) 600 MG 12 hr tablet Take 1,200 mg by mouth daily.    Yes Historical Provider, MD  hydrochlorothiazide (HYDRODIURIL) 12.5 MG tablet TAKE 1 TABLET BY MOUTH ONCE A DAY AS NEEDED FOR FLUID  RETENTION 09/18/15  Yes Troy Sine, MD  isosorbide mononitrate (IMDUR) 60 MG 24 hr tablet TAKE 1 TABLET BY MOUTH IN  THE MORNING AND 1/2 TABLET  AT NIGHT 09/18/15  Yes Troy Sine, MD  latanoprost (XALATAN) 0.005 % ophthalmic solution Place 1 drop into both eyes at bedtime.   Yes Historical Provider, MD  levothyroxine (SYNTHROID, LEVOTHROID) 100 MCG tablet Take 1 tablet by mouth  daily 05/15/15  Yes Troy Sine, MD  losartan (COZAAR) 100 MG tablet Take 1 tablet (100 mg total) by mouth daily. 06/06/15  Yes Isaiah Serge, NP  metoprolol  tartrate (LOPRESSOR) 25 MG tablet Take one-half tablet by  mouth twice a day 09/18/15  Yes Troy Sine, MD  Multiple Vitamin (MULTIVITAMIN) tablet Take 1 tablet by mouth daily.    Yes Historical Provider, MD  Multiple Vitamins-Minerals (PRESERVISION/LUTEIN) CAPS Take 1 capsule by mouth 2 (two) times daily.    Yes Historical Provider, MD  NONFORMULARY OR COMPOUNDED ITEM Allergy Vaccine 1:50 Given at PCP office   Yes Historical Provider, MD  ondansetron (ZOFRAN ODT) 4 MG disintegrating tablet 4mg  ODT q4 hours prn nausea/vomit 05/11/13  Yes Elnora Morrison, MD  pantoprazole (PROTONIX) 40 MG tablet Take 1 tablet (40 mg total) by mouth 2 (two) times daily before a meal. 08/31/15  Yes Carlis Stable, NP  potassium chloride (K-DUR,KLOR-CON) 10 MEQ tablet Take 1 tablet by mouth  daily 09/18/15  Yes Troy Sine, MD  timolol (TIMOPTIC) 0.5 % ophthalmic solution Place 1 drop into both eyes daily.    Yes Historical Provider, MD  triamcinolone (KENALOG) 0.1 % cream Apply topically 2 (two) times daily as needed.     Yes Historical Provider, MD  vitamin C (ASCORBIC ACID) 500 MG tablet Take 500 mg by mouth daily.    Yes Historical Provider, MD  vitamin E 200 UNIT capsule Take 200 Units by mouth every evening.    Yes Historical Provider, MD  nitroGLYCERIN (NITROSTAT) 0.4 MG SL tablet Place 1 tablet (0.4 mg total) under the tongue every 5 (five) minutes as needed. For chest pain Patient not taking: Reported on 12/12/2015 03/28/15   Troy Sine, MD    Allergies as of 12/12/2015 - Review Complete 12/12/2015  Allergen Reaction Noted  . Azithromycin Other (See Comments) 06/03/2013  . Doxazosin Shortness Of Breath 02/11/2012  . Acetaminophen    . Atenolol    . Hydrocodone Nausea And Vomiting 08/15/2011  . Levofloxacin Other (See Comments) 09/26/2014  . Morphine    . Penicillins Nausea And Vomiting   . Sulfonamide derivatives Nausea And Vomiting     Family History  Problem Relation Age of Onset  . Heart disease  Father     deceased age 12  . Stroke Mother   . Alzheimer's disease Mother   . Heart attack Brother     deceased age 106  . Colon cancer Neg Hx   . Cancer Other     niece    Social History   Social History  . Marital status: Married    Spouse name: N/A  . Number of children: 1  . Years of education: N/A   Occupational History  . Retired     Visual merchandiser pumping station  .  Retired   Social History Main Topics  . Smoking status: Former Smoker    Packs/day: 0.10    Years: 2.00    Types: Cigarettes, Cigars    Quit date: 05/06/1970  . Smokeless tobacco:  Never Used  . Alcohol use No  . Drug use: No  . Sexual activity: No   Other Topics Concern  . Not on file   Social History Narrative  . No narrative on file    Review of Systems: See HPI, otherwise negative ROS  Physical Exam: BP (!) 171/70   Pulse 66   Temp (!) 96.9 F (36.1 C) (Oral)   Ht 5\' 8"  (1.727 m)   Wt 136 lb 12.8 oz (62.1 kg)   BMI 20.80 kg/m  General:   Alert,  Well-developed, well-nourished, pleasant and cooperative in NAD   Impression:   Pleasant 82 year old gentleman with a history of GERD with Schatzki's ring requiring esophageal dilation previously. Patient doing well on Protonix 40 mg once daily. No recurrent esophageal dysphagia. Overall doing well from a GI standpoint.  Recommendations:   Continue Protonix 40 mg daily  GERD information provided  Office visit in 1 year          Notice: This dictation was prepared with Dragon dictation along with smaller phrase technology. Any transcriptional errors that result from this process are unintentional and may not be corrected upon review.

## 2015-12-12 NOTE — Patient Instructions (Signed)
Continue Protonix 40 mg daily  GERD information provided  Office visit in 1 year

## 2015-12-21 DIAGNOSIS — J309 Allergic rhinitis, unspecified: Secondary | ICD-10-CM | POA: Diagnosis not present

## 2016-01-03 DIAGNOSIS — J309 Allergic rhinitis, unspecified: Secondary | ICD-10-CM | POA: Diagnosis not present

## 2016-01-09 ENCOUNTER — Other Ambulatory Visit: Payer: Self-pay | Admitting: Urology

## 2016-01-09 DIAGNOSIS — C641 Malignant neoplasm of right kidney, except renal pelvis: Secondary | ICD-10-CM

## 2016-01-16 ENCOUNTER — Encounter: Payer: Self-pay | Admitting: Cardiovascular Disease

## 2016-01-16 ENCOUNTER — Ambulatory Visit (INDEPENDENT_AMBULATORY_CARE_PROVIDER_SITE_OTHER): Payer: Medicare Other | Admitting: Cardiovascular Disease

## 2016-01-16 VITALS — BP 134/60 | HR 59 | Ht 68.0 in | Wt 138.0 lb

## 2016-01-16 DIAGNOSIS — Q394 Esophageal web: Secondary | ICD-10-CM

## 2016-01-16 DIAGNOSIS — K222 Esophageal obstruction: Secondary | ICD-10-CM

## 2016-01-16 DIAGNOSIS — I440 Atrioventricular block, first degree: Secondary | ICD-10-CM

## 2016-01-16 DIAGNOSIS — I2510 Atherosclerotic heart disease of native coronary artery without angina pectoris: Secondary | ICD-10-CM | POA: Diagnosis not present

## 2016-01-16 DIAGNOSIS — I10 Essential (primary) hypertension: Secondary | ICD-10-CM

## 2016-01-16 DIAGNOSIS — I25810 Atherosclerosis of coronary artery bypass graft(s) without angina pectoris: Secondary | ICD-10-CM

## 2016-01-16 NOTE — Patient Instructions (Signed)
Your physician wants you to follow-up in: ONE YEAR WITH DR KELLY You will receive a reminder letter in the mail two months in advance. If you don't receive a letter, please call our office to schedule the follow-up appointment.   If you need a refill on your cardiac medications before your next appointment, please call your pharmacy.  

## 2016-01-17 NOTE — Progress Notes (Signed)
Patient ID: Dale [name], male   DOB: [birth year], 82 y.o.   MRN: [id number]    Primary M.D.: Dr. Redmond School  HPI: Dale [name] is a 82 y.o. male who presents to the office for a 10 month cardiology evaluation.  Dale [name] has known CAD and underwent initial intervention to his RCA in 2000. In 2009 a Cypher stent was placed beyond the acute margin of his RCA. His last cardiac catheterization was in September 2013 by Dr. [name] which showed 30% LAD narrowing after the second diagonal vessel, and normal left circumflex coronary artery, and patent RCA stents.  He has a history of hypothyroidism on Synthroid replacement and has a history of hyperlipidemia. He had been on on Crestor 5 mg plus Zetia 10 mg; however, due to recent increased cost of Zetia, this was discontinued and he has been on Crestor 20 mg daily.  He also has a history of GERD treated with Protonix, and he continues to be on dual antiplatelet therapy. He is responsive to Plavix on previous P2 Y12 testing.  When I last saw him 6 months ago he stated that he had experienced 3-4 episodes of chest pain over the six-month period.  Most of these episodes have occurred at night.  However, they have been nitrate responsive.  He states that they were similar to his previous discomfort.  When I saw him, I further titrated his isosorbide mononitrate to 60 mg in the morning and 30 mg at night.  He denies any definitive exertional precipitation of chest pain.  But at times he does admit to some chest wall-like discomfort, particularly when he loses arms up over his head.  He has a history of hypertension and recently has been maintained on losartan 100 mg, metoprolol tartrate 12.5 mg twice a day, and HCTZ 12.5 mg.    Since I last saw him, he has been stable without development of significant chest pain or shortness of breath.  He has not required any sublingual nitroglycerin pills.  He denies palpitations.  He denies PND or orthopnea.  Last year he  had undergone endoscopy by Dr. Buford Dresser.  He has been on Protonix for GERD.  Past Medical History:  Diagnosis Date  . Allergic rhinitis   . Anal fissure   . Asthma   . Cancer ([place])    skin  . Chest pain   . CHF (congestive heart failure) ([place])   . Coronary heart disease    s/p stenting. cath in 01/2012 noncritical occlusion  . GERD (gastroesophageal reflux disease)   . Glaucoma   . Hiatal hernia   . Hyperlipidemia   . Hypertension   . Hypothyroidism   . Idiopathic thrombocytopenic purpura ([place]) 2002  . Macular degeneration   . Nephrolithiasis   . PUD (peptic ulcer disease)    remote  . Sarcoidosis ([place])    pulmonary  . Schatzki's ring     Past Surgical History:  Procedure Laterality Date  . cardiac stents    . COLONOSCOPY  10/30/2006   Normal rectum, sigmoid diverticula.Remainder of colonic mucosa appeared normal.  . CORONARY ANGIOPLASTY WITH STENT PLACEMENT    . ESOPHAGOGASTRODUODENOSCOPY  06/19/2004   Two esophageal rings and esophageal web as described above.  All of these were disrupted by passing 56-French Venia Minks dilator/ Candida esophagitis,which appears to be incidental given history of   antibiotic use, but nevertheless will be treated.  . ESOPHAGOGASTRODUODENOSCOPY  10/30/2006   Distal tandem esophageal ring status post dilation disruption as  described above.  Otherwise normal esophagus/  Small hiatal hernia otherwise normal stomach, D1 and D2  . ESOPHAGOGASTRODUODENOSCOPY N/A 03/22/2015   Dr.Rourk- noncritical schatzki's ring and hiatal hernia-o/w normal EGD.   Marland Kitchen ESOPHAGOGASTRODUODENOSCOPY (EGD) WITH ESOPHAGEAL DILATION  03/04/2012   RMR- schatzki's ring, hiatal hernia, polypoid gastric mucosa, bx= minimally active gastritis.  Marland Kitchen LEFT HEART CATH N/A 02/02/2012   Procedure: LEFT HEART CATH;  Surgeon: Lorretta Harp, MD;  Location: [facility] CATH LAB;  Service: Cardiovascular;  Laterality: N/A;  . MEDIASTINOSCOPY     for dx sarcoid    Allergies  Allergen Reactions  .  Azithromycin Other (See Comments)    Sore mouth and fever blisters around mouth, sores in nose area as well  . Doxazosin Shortness Of Breath  . Acetaminophen     REACTION: UNKNOWN REACTION  . Atenolol     REACTION: UNKNOWN REACTION  . Hydrocodone Nausea And Vomiting  . Levofloxacin Other (See Comments)    Caused stomach problems.  . Morphine     "made me crazy"  . Penicillins Nausea And Vomiting  . Sulfonamide Derivatives Nausea And Vomiting    Current Outpatient Prescriptions  Medication Sig Dispense Refill  . amLODipine (NORVASC) 5 MG tablet Take 1 tablet (5 mg total) by mouth daily. 30 tablet 0  . aspirin 81 MG tablet Take 81 mg by mouth. Every other day    . atorvastatin (LIPITOR) 40 MG tablet Take 1 tablet by mouth  daily 90 tablet 1  . clopidogrel (PLAVIX) 75 MG tablet Take 1 tablet by mouth  daily 90 tablet 1  . dorzolamide (TRUSOPT) 2 % ophthalmic solution Place 1 drop into both eyes 2 (two) times daily.     . finasteride (PROSCAR) 5 MG tablet Take 5 mg by mouth daily.    Marland Kitchen guaiFENesin (MUCINEX) 600 MG 12 hr tablet Take 1,200 mg by mouth daily.     . hydrochlorothiazide (HYDRODIURIL) 12.5 MG tablet TAKE 1 TABLET BY MOUTH ONCE A DAY AS NEEDED FOR FLUID  RETENTION 90 tablet 1  . isosorbide mononitrate (IMDUR) 60 MG 24 hr tablet TAKE 1 TABLET BY MOUTH IN  THE MORNING AND 1/2 TABLET  AT NIGHT 135 tablet 1  . latanoprost (XALATAN) 0.005 % ophthalmic solution Place 1 drop into both eyes at bedtime.    Marland Kitchen levothyroxine (SYNTHROID, LEVOTHROID) 100 MCG tablet Take 1 tablet by mouth  daily 90 tablet 2  . losartan (COZAAR) 100 MG tablet Take 1 tablet (100 mg total) by mouth daily. 90 tablet 3  . metoprolol tartrate (LOPRESSOR) 25 MG tablet Take one-half tablet by  mouth twice a day 90 tablet 1  . Multiple Vitamin (MULTIVITAMIN) tablet Take 1 tablet by mouth daily.     . Multiple Vitamins-Minerals (PRESERVISION/LUTEIN) CAPS Take 1 capsule by mouth 2 (two) times daily.     . nitroGLYCERIN  (NITROSTAT) 0.4 MG SL tablet Place 1 tablet (0.4 mg total) under the tongue every 5 (five) minutes as needed. For chest pain 25 tablet 2  . NONFORMULARY OR COMPOUNDED ITEM Allergy Vaccine 1:50 Given at PCP office    . ondansetron (ZOFRAN ODT) 4 MG disintegrating tablet 71m ODT q4 hours prn nausea/vomit 4 tablet 0  . pantoprazole (PROTONIX) 40 MG tablet Take 1 tablet (40 mg total) by mouth 2 (two) times daily before a meal. 180 tablet 2  . potassium chloride (K-DUR,KLOR-CON) 10 MEQ tablet Take 1 tablet by mouth  daily 90 tablet 1  . timolol (TIMOPTIC) 0.5 % ophthalmic  solution Place 1 drop into both eyes daily.     Marland Kitchen triamcinolone (KENALOG) 0.1 % cream Apply topically 2 (two) times daily as needed.      . vitamin C (ASCORBIC ACID) 500 MG tablet Take 500 mg by mouth daily.     . vitamin E 200 UNIT capsule Take 200 Units by mouth every evening.      No current facility-administered medications for this visit.     Social History   Social History  . Marital status: Married    Spouse name: N/A  . Number of children: 1  . Years of education: N/A   Occupational History  . Retired     Visual merchandiser pumping station  .  Retired   Social History Main Topics  . Smoking status: Former Smoker    Packs/day: 0.10    Years: 2.00    Types: Cigarettes, Cigars    Quit date: 05/06/1970  . Smokeless tobacco: Never Used  . Alcohol use No  . Drug use: No  . Sexual activity: No   Other Topics Concern  . Not on file   Social History Narrative  . No narrative on file   Additional social history is notable that he is married and lives with his wife. He quit smoking over 40 years ago. Has one child. He remains active.  Family History  Problem Relation Age of Onset  . Heart disease Father     deceased age 80  . Stroke Mother   . Alzheimer's disease Mother   . Heart attack Brother     deceased age 88  . Cancer Other     niece  . Colon cancer Neg Hx    ROS General: Negative; No fevers, chills,  or night sweats;  HEENT: Negative; No changes in vision or hearing, sinus congestion, difficulty swallowing Pulmonary: Negative; No cough, wheezing, shortness of breath, hemoptysis Cardiovascular: See history of present illness GI: Negative; No nausea, vomiting, diarrhea, or abdominal pain GU: Negative; No dysuria, hematuria, or difficulty voiding Musculoskeletal: Negative; no myalgias, joint pain, or weakness Hematologic/Oncology: Negative; no easy bruising, bleeding Endocrine: Positive for hypothyroidism; no diabetes Neuro: Negative; no changes in balance, headaches Skin: Negative; No rashes or skin lesions Psychiatric: Negative; No behavioral problems, depression Sleep: Negative; No snoring, daytime sleepiness, hypersomnolence, bruxism, restless legs, hypnogognic hallucinations, no cataplexy Other comprehensive 14 point system review is negative.   PE BP 134/60   Pulse (!) 59   Ht _0  (1.727 m)   Wt 138 lb (62.6 kg)   BMI 20.98 kg/m    Repeat blood pressure by me 116/64  Wt Readings from Last 3 Encounters:  01/16/16 138 lb (62.6 kg)  12/12/15 136 lb 12.8 oz (62.1 kg)  05/03/15 144 lb (65.3 kg)   General: Alert, oriented, no distress.  Skin: Area of ecchymosis in a triangular fashion under the sternal notch in an area where he had rubbed.  Also significant ecchymoses to his arms bilaterally HEENT: Normocephalic, atraumatic. Pupils round and reactive; sclera anicteric;no lid lag.  Nose without nasal septal hypertrophy Mouth/Parynx benign; Mallinpatti scale 2 Neck: No JVD, no carotid bruits with normal carotid upstroke Lungs: clear to ausculatation and percussion; no wheezing or rales Chest wall: Nontender to palpation; specifically, no costochondral tenderness. Heart: RRR, s1 s2 normal 1/6 systolic murmur, unchanged. No S3 gallop no diastolic murmur, no rubs thrills or heaves Abdomen: soft, nontender; no hepatosplenomehaly, BS+; abdominal aorta nontender and not dilated by  palpation. Back: No CVA tenderness  Pulses 2+ Extremities: Trace ankle edema,  no clubbing cyanosis, Homan's sign negative  Neurologic: grossly nonfocal Psychologic: normal affect and mood.  ECG (independently read by me): Sinus bradycardia 59 bpm.  First-degree AV block with PR interval 240 ms.  No significant ST-T changes.  November 2016 ECG (independently read by me): Sinus bradycardia at 55 beats per minute with first-degree AV block.  PR interval 246 ms.  No significant ST-T changes.  May 2016 ECG (independently read by me): Sinus rhythm with first-degree heart block.  PR interval 226.  No ST segment changes.  February 2016 ECG (independently read by me): Sinus rhythm at 65 bpm.  Mild first-degree AV block with a PR interval 228 ms.  No significant ST segment changes.  December 2015 ECG (independently read by me): Sinus bradycardia 57 bpm.  Borderline first-degree AV block with a PR interval 216 daily.  Seconds.  QTc interval is normal at 373 ms.  June 2015 ECG (independently read by me): Sinus bradycardia 54 beats per minute.  First degree block with a PR interval of 232 ms.  No significant ST-T changes.  Prior ECG: Sinus bradycardia 54 beats per minute. PR interval 206 ms. QTc interval normal  LABS: BMP Latest Ref Rng & Units 09/08/2015 07/07/2014 05/11/2013  Glucose 65 - 99 mg/dL 99 95 127(H)  BUN 8 - 27 mg/dL _0 Creatinine 0.76 - 1.27 mg/dL 0.79 0.72(L) 0.76  BUN/Creat Ratio 10 - _1 -  Sodium 134 - 144 mmol/L 141 135 136(L)  Potassium 3.5 - 5.2 mmol/L 4.6 4.5 3.9  Chloride 96 - 106 mmol/L 102 96(L) 99  CO2 18 - 29 mmol/L _2 Calcium 8.6 - 10.2 mg/dL 9.1 9.3 9.2   Hepatic Function Latest Ref Rng & Units 09/08/2015 07/07/2014 05/11/2013  Total Protein 6.0 - 8.5 g/dL 6.3 6.5 6.7  Albumin 3.5 - 4.7 g/dL 3.9 4.2 3.6  AST 0 - 40 IU/L _3 ALT 0 - 44 IU/L _4 Alk Phosphatase 39 - 117 IU/L 76 73 74  Total Bilirubin 0.0 - 1.2 mg/dL 0.7 0.4 0.4  Bilirubin,  Direct - - - -   CBC Latest Ref Rng & Units 09/08/2015 07/07/2014 05/11/2013  WBC x10E3/uL CANCELED 4.1 7.3  Hemoglobin 12.6 - 17.7 g/dL - 12.8 12.8(L)  Hematocrit - CANCELED 40.4 38.2(L)  Platelets - CANCELED 238 157   Lab Results  Component Value Date   MCV 103 (H) 07/07/2014   MCV 101.6 (H) 05/11/2013   MCV 97.4 02/03/2012   Lab Results  Component Value Date   TSH 1.550 09/08/2015  No results found for: HGBA1C   Lipid Panel     Component Value Date/Time   CHOL 127 09/08/2015 0933   TRIG 89 09/08/2015 0933   HDL 48 09/08/2015 0933   CHOLHDL 2.2 07/07/2014 0954   CHOLHDL 2.6 08/15/2011 0555   VLDL 15 08/15/2011 0555   LDLCALC 61 09/08/2015 0933   RADIOLOGY: No results found.    ASSESSMENT AND PLAN: Dale [name] is an 82 years old gentleman who is  status post initial intervention to his RCA in 2000.Marland Kitchen He required subsequent intervention in 2009. At last catheterization in 2013 his RCA stents were patent and he had mild 30% LAD narrowing.  Last year, I added amlodipine 5 mg to his medical regimen, both for blood pressure control and potential anti-anginal benefit.  As I last saw him, he has not had any episodes  of angina requiring sublingual nitroglycerin use.  His ECG today remained stable without significant ST-T changes.  He has first-degree AV block.  He now has been on an increased dose of Crestor 20 mg and is tolerating this.  His most recent LDL cholesterol was excellent at 61  He denies myalgias.  His blood pressure today is stable.  His postnasal drip has improved with Zyrtec.  He has not had any significant GERD symptoms on Protonix.  As long as he remains stable, I will see him in one year for reevaluation.  Time spent: 25 minutes  Dale Sine, MD, [facility]  01/17/2016 5:34 PM

## 2016-01-18 DIAGNOSIS — J309 Allergic rhinitis, unspecified: Secondary | ICD-10-CM | POA: Diagnosis not present

## 2016-01-25 DIAGNOSIS — L738 Other specified follicular disorders: Secondary | ICD-10-CM | POA: Diagnosis not present

## 2016-01-26 ENCOUNTER — Ambulatory Visit (HOSPITAL_COMMUNITY)
Admission: RE | Admit: 2016-01-26 | Discharge: 2016-01-26 | Disposition: A | Discharge status: Home / Self Care | Payer: Medicare Other | Source: Ambulatory Visit | Attending: Urology | Admitting: Urology

## 2016-01-26 DIAGNOSIS — C641 Malignant neoplasm of right kidney, except renal pelvis: Secondary | ICD-10-CM | POA: Diagnosis not present

## 2016-01-26 DIAGNOSIS — N2889 Other specified disorders of kidney and ureter: Secondary | ICD-10-CM | POA: Insufficient documentation

## 2016-01-26 LAB — POCT I-STAT CREATININE: Creatinine, Ser: 0.7 mg/dL (ref 0.61–1.24)

## 2016-01-26 MED ORDER — IOPAMIDOL (ISOVUE-300) INJECTION 61%
100.0000 mL | Freq: Once | INTRAVENOUS | Status: AC | PRN
  Administered 2016-01-26: 100 mL via INTRAVENOUS

## 2016-01-30 ENCOUNTER — Ambulatory Visit (INDEPENDENT_AMBULATORY_CARE_PROVIDER_SITE_OTHER): Payer: Medicare Other | Admitting: Urology

## 2016-01-30 DIAGNOSIS — D3002 Benign neoplasm of left kidney: Secondary | ICD-10-CM | POA: Diagnosis not present

## 2016-01-30 DIAGNOSIS — N401 Enlarged prostate with lower urinary tract symptoms: Secondary | ICD-10-CM | POA: Diagnosis not present

## 2016-01-31 ENCOUNTER — Other Ambulatory Visit: Payer: Self-pay | Admitting: Urology

## 2016-01-31 DIAGNOSIS — N2889 Other specified disorders of kidney and ureter: Secondary | ICD-10-CM

## 2016-02-01 DIAGNOSIS — J309 Allergic rhinitis, unspecified: Secondary | ICD-10-CM | POA: Diagnosis not present

## 2016-02-12 DIAGNOSIS — X32XXXD Exposure to sunlight, subsequent encounter: Secondary | ICD-10-CM | POA: Diagnosis not present

## 2016-02-12 DIAGNOSIS — Z1283 Encounter for screening for malignant neoplasm of skin: Secondary | ICD-10-CM | POA: Diagnosis not present

## 2016-02-12 DIAGNOSIS — L570 Actinic keratosis: Secondary | ICD-10-CM | POA: Diagnosis not present

## 2016-02-12 DIAGNOSIS — D0439 Carcinoma in situ of skin of other parts of face: Secondary | ICD-10-CM | POA: Diagnosis not present

## 2016-02-15 DIAGNOSIS — J309 Allergic rhinitis, unspecified: Secondary | ICD-10-CM | POA: Diagnosis not present

## 2016-02-29 DIAGNOSIS — J309 Allergic rhinitis, unspecified: Secondary | ICD-10-CM | POA: Diagnosis not present

## 2016-03-06 ENCOUNTER — Ambulatory Visit
Admission: RE | Admit: 2016-03-06 | Discharge: 2016-03-06 | Disposition: A | Discharge status: Home / Self Care | Payer: Medicare Other | Source: Ambulatory Visit | Attending: Urology | Admitting: Urology

## 2016-03-06 DIAGNOSIS — N2889 Other specified disorders of kidney and ureter: Secondary | ICD-10-CM

## 2016-03-06 HISTORY — PX: IR GENERIC HISTORICAL: IMG1180011

## 2016-03-06 NOTE — Consult Note (Signed)
Chief Complaint: Patient was seen in consultation today for ablation of a left renal mass at the request of [place]  Referring Physician(s): Dahlstedt,Stephen  History of Present Illness: Dale [name] is a 82 y.o. male who has been followed by Dr. [name] for a left renal mass initially detected by CT on 04/12/2014 as a 10 mm solid and enhancing lesion in the anterior cortex of the mid to lower left kidney. This was followed with another CT on 06/02/2015 demonstrating interval growth with maximum diameter of 1.4 cm. Follow-up CT on 01/26/2016 demonstrates further growth with maximal diameter of 1.8 cm. The tumor is centered in the cortex and partially exophytic and partially endophytic. There is avid enhancement after contrast administration with findings most likely consistent with a clear cell carcinoma. There is no evidence by CT of metastatic disease in the abdomen or renal vein involvement.  Dale [name] is asymptomatic.  Past Medical History:  Diagnosis Date  . Allergic rhinitis   . Anal fissure   . Asthma   . Cancer ([place])    skin  . Chest pain   . CHF (congestive heart failure) ([place])   . Coronary heart disease    s/p stenting. cath in 01/2012 noncritical occlusion  . GERD (gastroesophageal reflux disease)   . Glaucoma   . Hiatal hernia   . Hyperlipidemia   . Hypertension   . Hypothyroidism   . Idiopathic thrombocytopenic purpura ([place]) 2002  . Macular degeneration   . Nephrolithiasis   . PUD (peptic ulcer disease)    remote  . Sarcoidosis ([place])    pulmonary  . Schatzki's ring     Past Surgical History:  Procedure Laterality Date  . cardiac stents    . COLONOSCOPY  10/30/2006   Normal rectum, sigmoid diverticula.Remainder of colonic mucosa appeared normal.  . CORONARY ANGIOPLASTY WITH STENT PLACEMENT    . ESOPHAGOGASTRODUODENOSCOPY  06/19/2004   Two esophageal rings and esophageal web as described above.  All of these were disrupted by passing 56-French  Venia Minks dilator/ Candida esophagitis,which appears to be incidental given history of   antibiotic use, but nevertheless will be treated.  . ESOPHAGOGASTRODUODENOSCOPY  10/30/2006   Distal tandem esophageal ring status post dilation disruption as  described above.  Otherwise normal esophagus/  Small hiatal hernia otherwise normal stomach, D1 and D2  . ESOPHAGOGASTRODUODENOSCOPY N/A 03/22/2015   Dr.Rourk- noncritical schatzki's ring and hiatal hernia-o/w normal EGD.   Marland Kitchen ESOPHAGOGASTRODUODENOSCOPY (EGD) WITH ESOPHAGEAL DILATION  03/04/2012   RMR- schatzki's ring, hiatal hernia, polypoid gastric mucosa, bx= minimally active gastritis.  Marland Kitchen LEFT HEART CATH N/A 02/02/2012   Procedure: LEFT HEART CATH;  Surgeon: Lorretta Harp, MD;  Location: [facility] CATH LAB;  Service: Cardiovascular;  Laterality: N/A;  . MEDIASTINOSCOPY     for dx sarcoid    Allergies: Azithromycin; Doxazosin; Acetaminophen; Atenolol; Hydrocodone; Levofloxacin; Morphine; Penicillins; and Sulfonamide derivatives  Medications: Prior to Admission medications   Medication Sig Start Date End Date Taking? Authorizing Provider  amLODipine (NORVASC) 5 MG tablet Take 1 tablet (5 mg total) by mouth daily. 09/15/15  Yes Troy Sine, MD  aspirin 81 MG tablet Take 81 mg by mouth. Every other day   Yes Historical Provider, MD  atorvastatin (LIPITOR) 40 MG tablet Take 1 tablet by mouth  daily 09/18/15  Yes Troy Sine, MD  clopidogrel (PLAVIX) 75 MG tablet Take 1 tablet by mouth  daily 10/09/15  Yes Troy Sine, MD  dorzolamide (TRUSOPT) 2 % ophthalmic  solution Place 1 drop into both eyes 2 (two) times daily.    Yes Historical Provider, MD  finasteride (PROSCAR) 5 MG tablet Take 5 mg by mouth daily.   Yes Historical Provider, MD  guaiFENesin (MUCINEX) 600 MG 12 hr tablet Take 1,200 mg by mouth daily.    Yes Historical Provider, MD  hydrochlorothiazide (HYDRODIURIL) 12.5 MG tablet TAKE 1 TABLET BY MOUTH ONCE A DAY AS NEEDED FOR FLUID  RETENTION  09/18/15  Yes Troy Sine, MD  isosorbide mononitrate (IMDUR) 60 MG 24 hr tablet TAKE 1 TABLET BY MOUTH IN  THE MORNING AND 1/2 TABLET  AT NIGHT 09/18/15  Yes Troy Sine, MD  latanoprost (XALATAN) 0.005 % ophthalmic solution Place 1 drop into both eyes at bedtime.   Yes Historical Provider, MD  levothyroxine (SYNTHROID, LEVOTHROID) 100 MCG tablet Take 1 tablet by mouth  daily 05/15/15  Yes Troy Sine, MD  losartan (COZAAR) 100 MG tablet Take 1 tablet (100 mg total) by mouth daily. 06/06/15  Yes Isaiah Serge, NP  metoprolol tartrate (LOPRESSOR) 25 MG tablet Take one-half tablet by  mouth twice a day 09/18/15  Yes Troy Sine, MD  Multiple Vitamin (MULTIVITAMIN) tablet Take 1 tablet by mouth daily.    Yes Historical Provider, MD  Multiple Vitamins-Minerals (PRESERVISION/LUTEIN) CAPS Take 1 capsule by mouth 2 (two) times daily.    Yes Historical Provider, MD  nitroGLYCERIN (NITROSTAT) 0.4 MG SL tablet Place 1 tablet (0.4 mg total) under the tongue every 5 (five) minutes as needed. For chest pain 03/28/15  Yes Troy Sine, MD  NONFORMULARY OR COMPOUNDED ITEM Allergy Vaccine 1:50 Given at PCP office   Yes Historical Provider, MD  ondansetron (ZOFRAN ODT) 4 MG disintegrating tablet 93m ODT q4 hours prn nausea/vomit 05/11/13  Yes JElnora Morrison MD  pantoprazole (PROTONIX) 40 MG tablet Take 1 tablet (40 mg total) by mouth 2 (two) times daily before a meal. 08/31/15  Yes ECarlis Stable NP  potassium chloride (K-DUR,KLOR-CON) 10 MEQ tablet Take 1 tablet by mouth  daily 09/18/15  Yes TTroy Sine MD  timolol (TIMOPTIC) 0.5 % ophthalmic solution Place 1 drop into both eyes daily.    Yes Historical Provider, MD  triamcinolone (KENALOG) 0.1 % cream Apply topically 2 (two) times daily as needed.     Yes Historical Provider, MD  vitamin C (ASCORBIC ACID) 500 MG tablet Take 500 mg by mouth daily.    Yes Historical Provider, MD  vitamin E 200 UNIT capsule Take 200 Units by mouth every evening.    Yes  Historical Provider, MD     Family History  Problem Relation Age of Onset  . Heart disease Father     deceased age 80 . Stroke Mother   . Alzheimer's disease Mother   . Heart attack Brother     deceased age 781 . Cancer Other     niece  . Colon cancer Neg Hx     Social History   Social History  . Marital status: Married    Spouse name: N/A  . Number of children: 1  . Years of education: N/A   Occupational History  . Retired     NVisual merchandiserpumping station  .  Retired   Social History Main Topics  . Smoking status: Former Smoker    Packs/day: 0.10    Years: 2.00    Types: Cigarettes, Cigars    Quit date: 05/06/1970  . Smokeless tobacco: Never Used  .  Alcohol use No  . Drug use: No  . Sexual activity: No   Other Topics Concern  . Not on file   Social History Narrative  . No narrative on file    Review of Systems: A 12 point ROS discussed and pertinent positives are indicated in the HPI above.  All other systems are negative.  Review of Systems  Constitutional: Negative.   Respiratory: Negative.   Cardiovascular: Negative.   Gastrointestinal: Negative.   Genitourinary: Negative.   Musculoskeletal: Negative.   Neurological: Negative.     Vital Signs: BP (!) 164/77 (BP Location: Left Arm, Patient Position: Sitting, Cuff Size: Normal)   Pulse (!) 59   Temp 97.5 F (36.4 C) (Oral)   Resp 14   Ht _0  (1.727 m)   Wt 134 lb (60.8 kg)   SpO2 100%   BMI 20.37 kg/m   Physical Exam  Constitutional: He is oriented to person, place, and time. No distress.  HENT:  Head: Normocephalic and atraumatic.  Neck: Neck supple. No JVD present. No tracheal deviation present.  Cardiovascular: Normal rate, regular rhythm and normal heart sounds.  Exam reveals no gallop and no friction rub.   No murmur heard. Pulmonary/Chest: Effort normal and breath sounds normal. No stridor. No respiratory distress. He has no wheezes. He has no rales.  Abdominal: Soft. Bowel sounds  are normal. He exhibits no distension and no mass. There is no tenderness. There is no rebound and no guarding.  Musculoskeletal: He exhibits no edema.  Neurological: He is alert and oriented to person, place, and time.  Skin: He is not diaphoretic.  Nursing note and vitals reviewed.   Imaging: No results found.  Labs:  CBC:  Recent Labs  09/08/15 0933  WBC CANCELED  HCT CANCELED  PLT CANCELED    COAGS: No results for input(s): INR, APTT in the last 8760 hours.  BMP:  Recent Labs  09/08/15 0933 01/26/16 1009  NA 141  --   K 4.6  --   CL 102  --   CO2 24  --   GLUCOSE 99  --   BUN 14  --   CALCIUM 9.1  --   CREATININE 0.79 0.70  GFRNONAA 82  --   GFRAA 95  --     LIVER FUNCTION TESTS:  Recent Labs  09/08/15 0933  BILITOT 0.7  AST 24  ALT 20  ALKPHOS 76  PROT 6.3  ALBUMIN 3.9    Assessment and Plan:  I met with Dale [name] and his wife. 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 We reviewed treatment options including continued surveillance, partial nephrectomy and percutaneous ablation.   Details of percutaneous cryoablation were discussed with the patient including risks. The lesion is in a location and of size amenable to percutaneous ablation. Separate biopsy does not need to be performed and can be performed at the time of ablation to establish a tissue diagnosis. After discussing options, Dale [name] would like to pursue ablation of the renal tumor. Given his age and comorbidities, a less invasive and nephron sparing procedure would be indicated to treat the tumor.  Dale [name] does have a history of coronary artery disease with prior PCI in 2000 and 2009 for RCA disease. He recently followed up with [name] and is asymptomatic with respect to coronary disease. Last catheterization in 2013 did not demonstrate significant obstructive disease. We will check with [name] to  see if Dale [name]  requires any further testing to be cleared for general anesthesia.  We will begin the scheduling and authorization process for renal cryoablation at [facility]. He will be observed overnight after the procedure.  Thank you for this interesting consult.  I greatly enjoyed meeting Dale [name] and look forward to participating in their care.  A copy of this report was sent to the requesting provider on this date.  Electronically SignedAletta Edouard T 03/06/2016, 4:59 PM   I spent a total of 40 Minutes in face to face in clinical consultation, greater than 50% of which was counseling/coordinating care for a left renal mass.

## 2016-03-07 ENCOUNTER — Telehealth: Payer: Self-pay | Admitting: Cardiovascular Disease

## 2016-03-07 ENCOUNTER — Telehealth: Payer: Self-pay | Admitting: Internal Medicine

## 2016-03-07 DIAGNOSIS — J309 Allergic rhinitis, unspecified: Secondary | ICD-10-CM | POA: Diagnosis not present

## 2016-03-07 NOTE — Telephone Encounter (Signed)
Allergy Serum Extract Date Mixed: 03/07/16 Vial: 1 Strength: 1:50 Here/Mail/Pick Up: mail Mixed By: tbs Last OV: 05/02/16 Pending OV: N/A

## 2016-03-07 NOTE — Telephone Encounter (Signed)
New message      Request for surgical clearance:  What type of surgery is being performed?  Left renal cryoablation 1. When is this surgery scheduled?  Pending clearance  2. Are there any medications that need to be held prior to surgery and how long? Need cardiac clearance only  Name of physician performing surgery?  Dr Kathlene Cote What is your office phone and fax number?  Fax [phone] or put note in epic

## 2016-03-11 ENCOUNTER — Other Ambulatory Visit: Payer: Self-pay | Admitting: Interventional Radiology

## 2016-03-11 DIAGNOSIS — J309 Allergic rhinitis, unspecified: Secondary | ICD-10-CM | POA: Diagnosis not present

## 2016-03-11 DIAGNOSIS — N2889 Other specified disorders of kidney and ureter: Secondary | ICD-10-CM

## 2016-03-11 DIAGNOSIS — Z23 Encounter for immunization: Secondary | ICD-10-CM | POA: Diagnosis not present

## 2016-03-14 ENCOUNTER — Other Ambulatory Visit: Payer: Self-pay | Admitting: *Deleted

## 2016-03-14 MED ORDER — NITROGLYCERIN 0.4 MG SL SUBL: 0.4000 mg | SUBLINGUAL_TABLET | SUBLINGUAL | 3 refills | Status: DC | PRN

## 2016-03-21 ENCOUNTER — Other Ambulatory Visit (HOSPITAL_COMMUNITY): Payer: Self-pay | Admitting: Interventional Radiology

## 2016-03-21 DIAGNOSIS — N2889 Other specified disorders of kidney and ureter: Secondary | ICD-10-CM

## 2016-03-21 NOTE — Telephone Encounter (Signed)
Follow up     Request for surgical clearance:  1. What type of surgery is being performed?  Left renal cryoablation  2. When is this surgery scheduled? Hopefully early jan  3. Are there any medications that need to be held prior to surgery and how long? Hold plavix 5 days prior and cardiac clearance  Name of physician performing surgery? Dr Kathlene Cote 4. What is your office phone and fax number?  Ok to put message in epic

## 2016-03-26 ENCOUNTER — Encounter: Payer: Self-pay | Admitting: Interventional Radiology

## 2016-03-27 ENCOUNTER — Other Ambulatory Visit: Payer: Self-pay | Admitting: Cardiovascular Disease

## 2016-04-04 DIAGNOSIS — J309 Allergic rhinitis, unspecified: Secondary | ICD-10-CM | POA: Diagnosis not present

## 2016-04-09 DIAGNOSIS — Z961 Presence of intraocular lens: Secondary | ICD-10-CM | POA: Diagnosis not present

## 2016-04-09 DIAGNOSIS — H40053 Ocular hypertension, bilateral: Secondary | ICD-10-CM | POA: Diagnosis not present

## 2016-04-09 DIAGNOSIS — H353131 Nonexudative age-related macular degeneration, bilateral, early dry stage: Secondary | ICD-10-CM | POA: Diagnosis not present

## 2016-04-18 DIAGNOSIS — J309 Allergic rhinitis, unspecified: Secondary | ICD-10-CM | POA: Diagnosis not present

## 2016-04-18 NOTE — Progress Notes (Signed)
Pt is being scheduled for preop appt; please place surgical orders in epic. Thanks.  

## 2016-04-22 NOTE — Telephone Encounter (Signed)
[place] for surgery and ok to hold plavix for 5 days

## 2016-04-22 NOTE — Telephone Encounter (Signed)
Clearance routed in EPIC.

## 2016-04-26 ENCOUNTER — Other Ambulatory Visit: Payer: Self-pay | Admitting: Cardiovascular Disease

## 2016-04-30 NOTE — Telephone Encounter (Signed)
REFILL 

## 2016-05-02 ENCOUNTER — Ambulatory Visit (INDEPENDENT_AMBULATORY_CARE_PROVIDER_SITE_OTHER): Payer: Medicare Other | Admitting: Internal Medicine

## 2016-05-02 ENCOUNTER — Encounter: Payer: Self-pay | Admitting: Internal Medicine

## 2016-05-02 DIAGNOSIS — J302 Other seasonal allergic rhinitis: Secondary | ICD-10-CM | POA: Diagnosis not present

## 2016-05-02 DIAGNOSIS — D869 Sarcoidosis, unspecified: Secondary | ICD-10-CM | POA: Diagnosis not present

## 2016-05-02 DIAGNOSIS — J4520 Mild intermittent asthma, uncomplicated: Secondary | ICD-10-CM | POA: Diagnosis not present

## 2016-05-02 DIAGNOSIS — J3089 Other allergic rhinitis: Secondary | ICD-10-CM

## 2016-05-02 DIAGNOSIS — I2510 Atherosclerotic heart disease of native coronary artery without angina pectoris: Secondary | ICD-10-CM | POA: Diagnosis not present

## 2016-05-02 NOTE — Progress Notes (Signed)
Subjective:    Patient ID: Dale [name], male    DOB: August 24, 1928, 82 y.o.   MRN: [id number]  HPI male never smoker followed for allergic rhinitis, asthma,  Sarcoid,  history right hydropneumothorax [id number] adhesion, complicated by glaucoma, history ITP  ------------------------------------------  05/03/2015-82 year old male never smoker followed for allergic rhinitis, asthma, history Sarcoid, history right hydropneumothorax [id number] adhesion, complicated by glaucoma, history ITP  Allergy vaccine 1:50 at [place]: patient states still suffering from low back pain and wears brace daily. Denies SOB or wheezing. No complaints voiced  CT chest 06/30/2013 IMPRESSION: 1. Normal. Right ribs. 2. 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 3. Pulmonary parenchymal scarring tethered to the pleural surface posterior laterally at approximately the posterior aspect of the right sixth and seventh ribs. Electronically Signed  By: Rozetta Nunnery M.D.  On: 06/30/2013 14:13  05/02/2016-82 year old male never smoker followed for allergic rhinitis, asthma, history sarcoid, history right hydropneumothorax [id number] adhesion, complicated by glaucoma, history ITP Allergy vaccine 1:50 at [place]-stopping now Follows for:pt states no difference in health from taking allergy shots biweekly. We discussed plans to close the allergy vaccine program at this office and agreed it was time for him to stop this therapy. He complains of mucus in mouth, associated with medications and indoor heat. No wheezing. Occasional cough but not much. Occasional sneeze. Rare need for rescue inhaler and no sleep disturbance. On Timoptic for glaucoma-discussed  Review of Systems- see HPI Constitutional:   No-   weight loss, night sweats, fevers, chills, fatigue, lassitude. HEENT:   No-  headaches, difficulty swallowing, tooth/dental problems, sore throat,       No-  sneezing, itching, ear ache, + nasal  congestion, +post nasal drip,  CV:  +chest pain- HPI, orthopnea, PND, swelling in lower extremities, anasarca, dizziness, palpitations Resp: No-acute shortness of breath with exertion or at rest.             No- productive cough,  No non-productive cough,  No-  coughing up of blood.              No-   change in color of mucus.  No- wheezing.   Skin: No-   rash or lesions. GI:  No-   heartburn, indigestion, abdominal pain, nausea, vomiting,  GU:  MS:  No-   joint pain or swelling.  + backpain Neuro- nothing unusual  Psych:  No- change in mood or affect. No depression or anxiety.  No memory loss.    Objective:   Physical Exam General- Alert, Oriented, Affect-appropriate, Distress- none acute. Lean,   comfortable appearing elderly man Skin- rash-none, lesions- none, excoriation- none Lymphadenopathy- none Head- atraumatic            Eyes- Gross vision intact, PERRLA, conjunctivae clear secretions            Ears- Hearing- mildly reduced, some scarring right TM            Nose- Clear, no- Septal dev, , polyps, erosion, perforation             Throat- Mallampati II , mucosa clear , drainage- none, tonsils- atrophic. edentulous Neck- flexible , trachea midline, no stridor , thyroid nl, carotid no bruit Chest - symmetrical excursion , unlabored           Heart/CV- RRR w/ extra beats , no murmur , no gallop  , no rub, nl s1 s2                           -  JVD- none , edema- none, stasis changes- none, varices- none           Lung- clear to P&A, wheeze- none, cough-none , dullness-none, rub- none           Chest wall-  Abd-  Br/ Gen/ Rectal- Not done, not indicated Extrem- cyanosis- none, clubbing, none, atrophy- none, strength- nl Neuro- grossly intact to observation    Assessment & Plan:

## 2016-05-02 NOTE — Patient Instructions (Addendum)
We are closing our allergy vaccine practice here this week. Ok to use up your current vaccine supply then stop allergy shots.  Since you have been very stable, I suggest you see Dr Gerarda Fraction from now on if needed for chest or breathing problems, along with your general medical care. He will let you know if he wants you to see a lung specialist again, in which case we would be happy to see you.  Please call if needed

## 2016-05-05 NOTE — Assessment & Plan Note (Signed)
He can keep a rescue inhaler available but has not had a significant exacerbation in a long time. Command follow-up with his primary physician. We will be happy to see him again at this office if needed.

## 2016-05-05 NOTE — Assessment & Plan Note (Signed)
At his age, vasomotor rhinitis is more likely than allergy. Recommended continued attention to home environment, avoiding dust and mold exposure for practical. DC allergy vaccine now. Recommended use of saline nasal spray when needed.

## 2016-05-05 NOTE — Assessment & Plan Note (Signed)
Long-term remission with no relapse or reactivation expected

## 2016-05-09 ENCOUNTER — Other Ambulatory Visit: Payer: Self-pay | Admitting: Radiology

## 2016-05-09 DIAGNOSIS — J309 Allergic rhinitis, unspecified: Secondary | ICD-10-CM | POA: Diagnosis not present

## 2016-05-09 NOTE — Patient Instructions (Addendum)
Dale [name]  05/09/2016   Your procedure is scheduled on: 05-17-16  Report to [facility] Main  Entrance take [facility]  elevators to 3rd floor to  [place] at    [place] AM.  Call this number if you have problems the morning of surgery [phone]   Remember: ONLY 1 PERSON MAY GO WITH YOU TO SHORT STAY TO GET  READY MORNING OF [place].  Do not eat food or drink liquids :After Midnight.     Take these medicines the morning of surgery with A SIP OF WATER: Amlodipine. Atorvastatin. Finasteride. Guaifenesin. Isosorbide(Imdur). Levothyroxine. Metoprolol. Pantoprazole. Eye drops usual. Use Plavix per MD instructions.                               You may not have any metal on your body including hair pins and              piercings  Do not wear jewelry, make-up, lotions, powders or perfumes, deodorant             Do not wear nail polish.  Do not shave  48 hours prior to surgery.              Men may shave face and neck.   Do not bring valuables to the hospital. [place].  Contacts, dentures or bridgework may not be worn into surgery.  Leave suitcase in the car. After surgery it may be brought to your room.               Please read over the following fact sheets you were given: _____________________________________________________________________             [facility] - Preparing for Surgery Before surgery, you can play an important role.  Because skin is not sterile, your skin needs to be as free of germs as possible.  You can reduce the number of germs on your skin by washing with CHG (chlorahexidine gluconate) soap before surgery.  CHG is an antiseptic cleaner which kills germs and bonds with the skin to continue killing germs even after washing. Please DO NOT use if you have an allergy to CHG or antibacterial soaps.  If your skin becomes reddened/irritated stop using the CHG and inform your nurse when  you arrive at Short Stay. Do not shave (including legs and underarms) for at least 48 hours prior to the first CHG shower.  You may shave your face/neck. Please follow these instructions carefully:  1.  Shower with CHG Soap the night before surgery and the  morning of Surgery.  2.  If you choose to wash your hair, wash your hair first as usual with your  normal  shampoo.  3.  After you shampoo, rinse your hair and body thoroughly to remove the  shampoo.                           4.  Use CHG as you would any other liquid soap.  You can apply chg directly  to the skin and wash  Gently with a scrungie or clean washcloth.  5.  Apply the CHG Soap to your body ONLY FROM THE NECK DOWN.   Do not use on face/ open                           Wound or open sores. Avoid contact with eyes, ears mouth and genitals (private parts).                       Wash face,  Genitals (private parts) with your normal soap.             6.  Wash thoroughly, paying special attention to the area where your surgery  will be performed.  7.  Thoroughly rinse your body with warm water from the neck down.  8.  DO NOT shower/wash with your normal soap after using and rinsing off  the CHG Soap.                9.  Pat yourself dry with a clean towel.            10.  Wear clean pajamas.            11.  Place clean sheets on your bed the night of your first shower and do not  sleep with pets. Day of Surgery : Do not apply any lotions/deodorants the morning of surgery.  Please wear clean clothes to the hospital/surgery center.  FAILURE TO FOLLOW THESE INSTRUCTIONS MAY RESULT IN THE CANCELLATION OF YOUR SURGERY PATIENT SIGNATURE_________________________________  NURSE SIGNATURE__________________________________  ________________________________________________________________________

## 2016-05-09 NOTE — Progress Notes (Signed)
05-09-16 1600 Pt coming for 1000 AM  PAT appointment 05-10-16 Friday, need MD order entry in [place].

## 2016-05-10 ENCOUNTER — Ambulatory Visit (HOSPITAL_COMMUNITY)
Admission: RE | Admit: 2016-05-10 | Discharge: 2016-05-10 | Disposition: A | Discharge status: Home / Self Care | Payer: Medicare Other | Source: Ambulatory Visit | Attending: Radiology | Admitting: Radiology

## 2016-05-10 ENCOUNTER — Encounter (HOSPITAL_COMMUNITY)
Admission: RE | Admit: 2016-05-10 | Discharge: 2016-05-10 | Disposition: A | Discharge status: Home / Self Care | Payer: Medicare Other | Source: Ambulatory Visit | Attending: Interventional Radiology | Admitting: Interventional Radiology

## 2016-05-10 ENCOUNTER — Encounter (HOSPITAL_COMMUNITY): Payer: Self-pay

## 2016-05-10 DIAGNOSIS — Z01812 Encounter for preprocedural laboratory examination: Secondary | ICD-10-CM | POA: Insufficient documentation

## 2016-05-10 DIAGNOSIS — Z01818 Encounter for other preprocedural examination: Secondary | ICD-10-CM | POA: Insufficient documentation

## 2016-05-10 DIAGNOSIS — J984 Other disorders of lung: Secondary | ICD-10-CM | POA: Diagnosis not present

## 2016-05-10 DIAGNOSIS — Z01810 Encounter for preprocedural cardiovascular examination: Secondary | ICD-10-CM | POA: Diagnosis not present

## 2016-05-10 HISTORY — DX: Cardiac arrhythmia, unspecified: I49.9

## 2016-05-10 HISTORY — DX: Angina pectoris, unspecified: I20.9

## 2016-05-10 LAB — BASIC METABOLIC PANEL
Anion gap: 6 (ref 5–15)
BUN: 25 mg/dL — ABNORMAL HIGH (ref 6–20)
CALCIUM: 9.2 mg/dL (ref 8.9–10.3)
CHLORIDE: 111 mmol/L (ref 101–111)
CO2: 22 mmol/L (ref 22–32)
CREATININE: 0.73 mg/dL (ref 0.61–1.24)
GFR calc non Af Amer: >60 mL/min (ref >60)
Glucose, Bld: 96 mg/dL (ref 65–99)
Potassium: 3.4 mmol/L — ABNORMAL LOW (ref 3.5–5.1)
Sodium: 139 mmol/L (ref 135–145)

## 2016-05-10 LAB — CBC WITH DIFFERENTIAL/PLATELET
Basophils Absolute: 0.1 10*3/uL (ref 0.0–0.1)
Basophils Relative: 1 %
EOS ABS: 0.2 10*3/uL (ref 0.0–0.7)
EOS PCT: 4 %
HEMATOCRIT: 36.1 % — AB (ref 39.0–52.0)
HEMOGLOBIN: 11.9 g/dL — AB (ref 13.0–17.0)
Lymphocytes Relative: 19 %
Lymphs Abs: 1 10*3/uL (ref 0.7–4.0)
MCH: 33.6 pg (ref 26.0–34.0)
MCHC: 33 g/dL (ref 30.0–36.0)
MCV: 102 fL — ABNORMAL HIGH (ref 78.0–100.0)
MONO ABS: 0.7 10*3/uL (ref 0.1–1.0)
Monocytes Relative: 13 %
NEUTROS PCT: 63 %
Neutro Abs: 3.1 10*3/uL (ref 1.7–7.7)
Platelets: 182 10*3/uL (ref 150–400)
RBC: 3.54 MIL/uL — ABNORMAL LOW (ref 4.22–5.81)
RDW: 13.7 % (ref 11.5–15.5)
WBC: 5.1 10*3/uL (ref 4.0–10.5)

## 2016-05-10 LAB — PROTIME-INR
INR: 1.1
PROTHROMBIN TIME: 14.2 s (ref 11.4–15.2)

## 2016-05-10 LAB — ABO/RH: ABO/RH(D): O POS

## 2016-05-10 NOTE — Pre-Procedure Instructions (Addendum)
EKG (poor quality) 01/16/16, epic CT a/p 01/26/16, epic LOV Pulmonary, 02/01/16, epic LOV Cardiolody, 01/16/16, epic  Pt states last day to take Plavix is Saturday 05/11/16. Then he will hold for surgery.  Pt states Dr. Kathlene Cote told pt he could continue his baby Aspirin 81mg .

## 2016-05-13 ENCOUNTER — Other Ambulatory Visit: Payer: Self-pay | Admitting: Radiology

## 2016-05-14 ENCOUNTER — Encounter (HOSPITAL_COMMUNITY): Payer: Medicare Other

## 2016-05-17 ENCOUNTER — Encounter (HOSPITAL_COMMUNITY): Payer: Self-pay | Admitting: *Deleted

## 2016-05-17 ENCOUNTER — Other Ambulatory Visit: Payer: Self-pay | Admitting: Radiology

## 2016-05-17 ENCOUNTER — Encounter (HOSPITAL_COMMUNITY)
Admission: RE | Disposition: A | Discharge status: Home / Self Care | Payer: Self-pay | Source: Ambulatory Visit | Attending: Interventional Radiology

## 2016-05-17 ENCOUNTER — Observation Stay (HOSPITAL_COMMUNITY)
Admission: RE | Admit: 2016-05-17 | Discharge: 2016-05-18 | Disposition: A | Discharge status: Home / Self Care | Payer: Medicare Other | Source: Ambulatory Visit | Attending: Interventional Radiology | Admitting: Interventional Radiology

## 2016-05-17 ENCOUNTER — Ambulatory Visit (HOSPITAL_COMMUNITY): Payer: Medicare Other | Admitting: Certified Registered Nurse Anesthetist

## 2016-05-17 ENCOUNTER — Encounter (HOSPITAL_COMMUNITY): Payer: Self-pay

## 2016-05-17 ENCOUNTER — Ambulatory Visit (HOSPITAL_COMMUNITY)
Admission: RE | Admit: 2016-05-17 | Discharge: 2016-05-17 | Disposition: A | Discharge status: Home / Self Care | Payer: Medicare Other | Source: Ambulatory Visit | Attending: Interventional Radiology | Admitting: Interventional Radiology

## 2016-05-17 DIAGNOSIS — Z955 Presence of coronary angioplasty implant and graft: Secondary | ICD-10-CM | POA: Insufficient documentation

## 2016-05-17 DIAGNOSIS — C642 Malignant neoplasm of left kidney, except renal pelvis: Secondary | ICD-10-CM | POA: Diagnosis not present

## 2016-05-17 DIAGNOSIS — I10 Essential (primary) hypertension: Secondary | ICD-10-CM | POA: Insufficient documentation

## 2016-05-17 DIAGNOSIS — E785 Hyperlipidemia, unspecified: Secondary | ICD-10-CM | POA: Insufficient documentation

## 2016-05-17 DIAGNOSIS — J45909 Unspecified asthma, uncomplicated: Secondary | ICD-10-CM | POA: Insufficient documentation

## 2016-05-17 DIAGNOSIS — N2889 Other specified disorders of kidney and ureter: Secondary | ICD-10-CM

## 2016-05-17 DIAGNOSIS — Z7982 Long term (current) use of aspirin: Secondary | ICD-10-CM | POA: Diagnosis not present

## 2016-05-17 DIAGNOSIS — E039 Hypothyroidism, unspecified: Secondary | ICD-10-CM | POA: Diagnosis not present

## 2016-05-17 DIAGNOSIS — I509 Heart failure, unspecified: Secondary | ICD-10-CM | POA: Diagnosis not present

## 2016-05-17 DIAGNOSIS — Z7902 Long term (current) use of antithrombotics/antiplatelets: Secondary | ICD-10-CM | POA: Diagnosis not present

## 2016-05-17 DIAGNOSIS — I2510 Atherosclerotic heart disease of native coronary artery without angina pectoris: Secondary | ICD-10-CM | POA: Insufficient documentation

## 2016-05-17 DIAGNOSIS — Z87891 Personal history of nicotine dependence: Secondary | ICD-10-CM | POA: Insufficient documentation

## 2016-05-17 DIAGNOSIS — K219 Gastro-esophageal reflux disease without esophagitis: Secondary | ICD-10-CM | POA: Diagnosis not present

## 2016-05-17 DIAGNOSIS — Z79899 Other long term (current) drug therapy: Secondary | ICD-10-CM | POA: Diagnosis not present

## 2016-05-17 HISTORY — PX: RADIOLOGY WITH ANESTHESIA: SHX6223

## 2016-05-17 LAB — TYPE AND SCREEN
ABO/RH(D): O POS
Antibody Screen: NEGATIVE

## 2016-05-17 SURGERY — RADIOLOGY WITH ANESTHESIA
Anesthesia: General | Laterality: Left

## 2016-05-17 MED ORDER — ACETAZOLAMIDE 250 MG PO TABS
250.0000 mg | ORAL_TABLET | Freq: Two times a day (BID) | ORAL | Status: DC
  Administered 2016-05-17 – 2016-05-18 (×2): 250 mg via ORAL
  Filled 2016-05-17 (×2): qty 1

## 2016-05-17 MED ORDER — LEVOTHYROXINE SODIUM 100 MCG PO TABS: 100.0000 ug | ORAL_TABLET | Freq: Every day | ORAL | Status: DC

## 2016-05-17 MED ORDER — ISOSORBIDE MONONITRATE ER 30 MG PO TB24
30.0000 mg | ORAL_TABLET | Freq: Every evening | ORAL | Status: DC
  Administered 2016-05-17: 30 mg via ORAL
  Filled 2016-05-17: qty 1

## 2016-05-17 MED ORDER — ISOSORBIDE MONONITRATE ER 60 MG PO TB24
60.0000 mg | ORAL_TABLET | Freq: Every day | ORAL | Status: DC
  Administered 2016-05-18: 60 mg via ORAL
  Filled 2016-05-17: qty 1

## 2016-05-17 MED ORDER — PHENYLEPHRINE 40 MCG/ML (10ML) SYRINGE FOR IV PUSH (FOR BLOOD PRESSURE SUPPORT)
PREFILLED_SYRINGE | INTRAVENOUS | Status: DC | PRN
  Administered 2016-05-17 (×7): 40 ug via INTRAVENOUS

## 2016-05-17 MED ORDER — NITROGLYCERIN 0.4 MG SL SUBL: 0.4000 mg | SUBLINGUAL_TABLET | SUBLINGUAL | Status: DC | PRN

## 2016-05-17 MED ORDER — IOPAMIDOL (ISOVUE-300) INJECTION 61%
INTRAVENOUS | Status: AC
  Filled 2016-05-17: qty 30

## 2016-05-17 MED ORDER — FENTANYL CITRATE (PF) 100 MCG/2ML IJ SOLN
INTRAMUSCULAR | Status: AC
  Filled 2016-05-17: qty 2

## 2016-05-17 MED ORDER — ONDANSETRON HCL 4 MG/2ML IJ SOLN: 4.0000 mg | Freq: Once | INTRAMUSCULAR | Status: DC | PRN

## 2016-05-17 MED ORDER — PANTOPRAZOLE SODIUM 40 MG PO TBEC
40.0000 mg | DELAYED_RELEASE_TABLET | Freq: Two times a day (BID) | ORAL | Status: DC
  Administered 2016-05-17 – 2016-05-18 (×2): 40 mg via ORAL
  Filled 2016-05-17 (×3): qty 1

## 2016-05-17 MED ORDER — LIDOCAINE HCL (CARDIAC) 20 MG/ML IV SOLN
INTRAVENOUS | Status: DC | PRN
  Administered 2016-05-17: 100 mg via INTRAVENOUS

## 2016-05-17 MED ORDER — LOSARTAN POTASSIUM 50 MG PO TABS: 100.0000 mg | ORAL_TABLET | Freq: Every day | ORAL | Status: DC

## 2016-05-17 MED ORDER — HYDROCHLOROTHIAZIDE 12.5 MG PO CAPS
12.5000 mg | ORAL_CAPSULE | Freq: Every day | ORAL | Status: DC
  Administered 2016-05-18: 12.5 mg via ORAL
  Filled 2016-05-17 (×2): qty 1

## 2016-05-17 MED ORDER — LATANOPROST 0.005 % OP SOLN
1.0000 [drp] | Freq: Every day | OPHTHALMIC | Status: DC
  Administered 2016-05-17: 1 [drp] via OPHTHALMIC
  Filled 2016-05-17: qty 2.5

## 2016-05-17 MED ORDER — SODIUM CHLORIDE 0.9 % IV SOLN
INTRAVENOUS | Status: AC
  Filled 2016-05-17: qty 250

## 2016-05-17 MED ORDER — ROCURONIUM BROMIDE 10 MG/ML (PF) SYRINGE
PREFILLED_SYRINGE | INTRAVENOUS | Status: DC | PRN
  Administered 2016-05-17: 50 mg via INTRAVENOUS
  Administered 2016-05-17: 10 mg via INTRAVENOUS

## 2016-05-17 MED ORDER — ISOSORBIDE MONONITRATE ER 30 MG PO TB24: 30.0000 mg | ORAL_TABLET | Freq: Every day | ORAL | Status: DC

## 2016-05-17 MED ORDER — SENNOSIDES-DOCUSATE SODIUM 8.6-50 MG PO TABS: 1.0000 | ORAL_TABLET | Freq: Every day | ORAL | Status: DC | PRN

## 2016-05-17 MED ORDER — MEPERIDINE HCL 50 MG/ML IJ SOLN: 6.2500 mg | INTRAMUSCULAR | Status: DC | PRN

## 2016-05-17 MED ORDER — METOPROLOL TARTRATE 25 MG PO TABS
12.5000 mg | ORAL_TABLET | Freq: Two times a day (BID) | ORAL | Status: DC
  Administered 2016-05-17 – 2016-05-18 (×2): 12.5 mg via ORAL
  Filled 2016-05-17 (×2): qty 1

## 2016-05-17 MED ORDER — DOCUSATE SODIUM 100 MG PO CAPS
100.0000 mg | ORAL_CAPSULE | Freq: Two times a day (BID) | ORAL | Status: DC
  Administered 2016-05-17 – 2016-05-18 (×2): 100 mg via ORAL
  Filled 2016-05-17 (×2): qty 1

## 2016-05-17 MED ORDER — FENTANYL CITRATE (PF) 100 MCG/2ML IJ SOLN
INTRAMUSCULAR | Status: DC | PRN
  Administered 2016-05-17: 100 ug via INTRAVENOUS

## 2016-05-17 MED ORDER — ONDANSETRON HCL 4 MG/2ML IJ SOLN
INTRAMUSCULAR | Status: DC | PRN
  Administered 2016-05-17: 4 mg via INTRAVENOUS

## 2016-05-17 MED ORDER — SUGAMMADEX SODIUM 200 MG/2ML IV SOLN
INTRAVENOUS | Status: DC | PRN
  Administered 2016-05-17: 150 mg via INTRAVENOUS

## 2016-05-17 MED ORDER — LACTATED RINGERS IV SOLN
INTRAVENOUS | Status: DC
Start: 2016-05-17 — End: 2016-05-18
  Administered 2016-05-17 (×2): via INTRAVENOUS

## 2016-05-17 MED ORDER — DEXAMETHASONE SODIUM PHOSPHATE 10 MG/ML IJ SOLN
INTRAMUSCULAR | Status: DC | PRN
Start: 2016-05-17 — End: 2016-05-17
  Administered 2016-05-17: 10 mg via INTRAVENOUS

## 2016-05-17 MED ORDER — ONDANSETRON HCL 4 MG/2ML IJ SOLN: 4.0000 mg | Freq: Four times a day (QID) | INTRAMUSCULAR | Status: DC | PRN

## 2016-05-17 MED ORDER — VANCOMYCIN HCL IN DEXTROSE 1-5 GM/200ML-% IV SOLN
1000.0000 mg | INTRAVENOUS | Status: AC
  Administered 2016-05-17: 1000 mg via INTRAVENOUS
  Filled 2016-05-17: qty 200

## 2016-05-17 MED ORDER — TIMOLOL MALEATE 0.5 % OP SOLN
1.0000 [drp] | Freq: Every day | OPHTHALMIC | Status: DC
  Administered 2016-05-18: 1 [drp] via OPHTHALMIC
  Filled 2016-05-17: qty 5

## 2016-05-17 MED ORDER — HYDROCHLOROTHIAZIDE 25 MG PO TABS: 12.5000 mg | ORAL_TABLET | Freq: Every day | ORAL | Status: DC

## 2016-05-17 MED ORDER — AMLODIPINE BESYLATE 5 MG PO TABS
5.0000 mg | ORAL_TABLET | Freq: Every day | ORAL | Status: DC
  Administered 2016-05-18: 5 mg via ORAL
  Filled 2016-05-17: qty 1

## 2016-05-17 MED ORDER — HYDROMORPHONE HCL 1 MG/ML IJ SOLN: 0.2500 mg | INTRAMUSCULAR | Status: DC | PRN

## 2016-05-17 MED ORDER — EPHEDRINE SULFATE-NACL 50-0.9 MG/10ML-% IV SOSY
PREFILLED_SYRINGE | INTRAVENOUS | Status: DC | PRN
  Administered 2016-05-17 (×5): 5 mg via INTRAVENOUS
  Administered 2016-05-17: 10 mg via INTRAVENOUS

## 2016-05-17 MED ORDER — DORZOLAMIDE HCL 2 % OP SOLN
1.0000 [drp] | Freq: Two times a day (BID) | OPHTHALMIC | Status: DC
  Administered 2016-05-17 – 2016-05-18 (×2): 1 [drp] via OPHTHALMIC
  Filled 2016-05-17: qty 10

## 2016-05-17 MED ORDER — PROPOFOL 10 MG/ML IV BOLUS
INTRAVENOUS | Status: DC | PRN
  Administered 2016-05-17: 130 mg via INTRAVENOUS

## 2016-05-17 MED ORDER — ATORVASTATIN CALCIUM 40 MG PO TABS
40.0000 mg | ORAL_TABLET | Freq: Every day | ORAL | Status: DC
  Administered 2016-05-17 – 2016-05-18 (×2): 40 mg via ORAL
  Filled 2016-05-17 (×2): qty 1

## 2016-05-17 MED ORDER — GUAIFENESIN ER 600 MG PO TB12
1200.0000 mg | ORAL_TABLET | Freq: Every day | ORAL | Status: DC
  Administered 2016-05-18: 1200 mg via ORAL
  Filled 2016-05-17: qty 2

## 2016-05-17 MED ORDER — LOSARTAN POTASSIUM 50 MG PO TABS
100.0000 mg | ORAL_TABLET | Freq: Every day | ORAL | Status: DC
  Administered 2016-05-18: 100 mg via ORAL
  Filled 2016-05-17: qty 2

## 2016-05-17 MED ORDER — SODIUM CHLORIDE 0.9 % IV SOLN
INTRAVENOUS | Status: DC
  Administered 2016-05-17: 18:00:00 via INTRAVENOUS

## 2016-05-17 MED ORDER — HYDROMORPHONE HCL 1 MG/ML IJ SOLN: 1.0000 mg | INTRAMUSCULAR | Status: DC | PRN

## 2016-05-17 MED ORDER — FINASTERIDE 5 MG PO TABS
5.0000 mg | ORAL_TABLET | Freq: Every day | ORAL | Status: DC
  Administered 2016-05-17 – 2016-05-18 (×2): 5 mg via ORAL
  Filled 2016-05-17 (×2): qty 1

## 2016-05-17 NOTE — Anesthesia Procedure Notes (Signed)
Procedure Name: Intubation Date/Time: 05/17/2016 12:28 PM Performed by: Carleene Cooper A Pre-anesthesia Checklist: Patient identified, Emergency Drugs available, Suction available, Patient being monitored and Timeout performed Patient Re-evaluated:Patient Re-evaluated prior to inductionOxygen Delivery Method: Circle system utilized Preoxygenation: Pre-oxygenation with 100% oxygen Intubation Type: IV induction Ventilation: Mask ventilation without difficulty Laryngoscope Size: Mac and 3 Grade View: Grade I Tube type: Oral Tube size: 7.5 mm Number of attempts: 1 Airway Equipment and Method: Stylet Placement Confirmation: ETT inserted through vocal cords under direct vision,  positive ETCO2 and breath sounds checked- equal and bilateral Secured at: 22 cm Tube secured with: Tape Dental Injury: Teeth and Oropharynx as per pre-operative assessment

## 2016-05-17 NOTE — Anesthesia Preprocedure Evaluation (Signed)
Anesthesia Evaluation  Patient identified by MRN, date of birth, ID band Patient awake    Reviewed: Allergy & Precautions, NPO status , Patient's Chart, lab work & pertinent test results  Airway Mallampati: I  TM Distance: >3 FB Neck ROM: Full    Dental   Pulmonary former smoker,    Pulmonary exam normal        Cardiovascular hypertension, Pt. on medications + CAD and + Cardiac Stents  Normal cardiovascular exam     Neuro/Psych    GI/Hepatic GERD  Medicated and Controlled,  Endo/Other    Renal/GU      Musculoskeletal   Abdominal   Peds  Hematology   Anesthesia Other Findings   Reproductive/Obstetrics                             Anesthesia Physical Anesthesia Plan  ASA: III  Anesthesia Plan: General   Post-op Pain Management:    Induction: Intravenous  Airway Management Planned: Oral ETT  Additional Equipment:   Intra-op Plan:   Post-operative Plan: Extubation in OR  Informed Consent: I have reviewed the patients History and Physical, chart, labs and discussed the procedure including the risks, benefits and alternatives for the proposed anesthesia with the patient or authorized representative who has indicated his/her understanding and acceptance.     Plan Discussed with: CRNA and Surgeon  Anesthesia Plan Comments:         Anesthesia Quick Evaluation

## 2016-05-17 NOTE — Procedures (Signed)
Interventional Radiology Procedure Note  Procedure: CT guided biopsy and cryoablation of left renal mass  Complications: None  Estimated Blood Loss: < 10 mL  Findings:  18 G core biopsy x 1 of left renal mass via 17 G needle. Cryoablation of tumor with 2 Galil Ice Rod CX probes.  Plan:  PACU recovery followed by overnight observation  Dale [name] T. Kathlene Cote, M.D Pager:  [phone]

## 2016-05-17 NOTE — H&P (Signed)
Chief Complaint: "I'm here to have my kidney treated  Referring Physician(s): Dr. Franchot Gallo  Supervising Physician: Aletta Edouard  Patient Status: [place]  History of Present Illness: Dale [name] is a 83 y.o. male who was referred to Dr. Kathlene Cote for evaluation of a left renal mass. After consult, he has now been scheduled for CT guided cryoablation procedure. See full consult note from 11/1. Pt feels well this am, offers no c/o. Wife and son at bedside. PMHx, meds, labs, imaging, allergies reviewed. His Cardiologist was consulted for approval of procedure and the okay to hold his Plavix given underlying hx of CAD and PCI. [name] has given clearance for both, see note from 12/18. Has been NPO this am except his morning meds.  Past Medical History:  Diagnosis Date  . Allergic rhinitis   . Anal fissure   . Anginal pain ([place])   . Asthma   . Cancer ([place])    skin  . Chest pain   . CHF (congestive heart failure) ([place])   . Coronary heart disease    s/p stenting. cath in 01/2012 noncritical occlusion  . Dysrhythmia    1st degree heart block  . GERD (gastroesophageal reflux disease)   . Glaucoma   . Hiatal hernia   . Hyperlipidemia   . Hypertension   . Hypothyroidism   . Idiopathic thrombocytopenic purpura ([place]) 2002  . Macular degeneration   . Nephrolithiasis   . PUD (peptic ulcer disease)    remote  . Sarcoidosis ([place])    pulmonary  . Schatzki's ring     Past Surgical History:  Procedure Laterality Date  . cardiac stents    . COLONOSCOPY  10/30/2006   Normal rectum, sigmoid diverticula.Remainder of colonic mucosa appeared normal.  . CORONARY ANGIOPLASTY WITH STENT PLACEMENT     about 10 years ago per pt (around 2007)  . ESOPHAGOGASTRODUODENOSCOPY  06/19/2004   Two esophageal rings and esophageal web as described above.  All of these were disrupted by passing 56-French Venia Minks dilator/ Candida esophagitis,which appears to be incidental given  history of   antibiotic use, but nevertheless will be treated.  . ESOPHAGOGASTRODUODENOSCOPY  10/30/2006   Distal tandem esophageal ring status post dilation disruption as  described above.  Otherwise normal esophagus/  Small hiatal hernia otherwise normal stomach, D1 and D2  . ESOPHAGOGASTRODUODENOSCOPY N/A 03/22/2015   Dr.Rourk- noncritical schatzki's ring and hiatal hernia-o/w normal EGD.   Marland Kitchen ESOPHAGOGASTRODUODENOSCOPY (EGD) WITH ESOPHAGEAL DILATION  03/04/2012   RMR- schatzki's ring, hiatal hernia, polypoid gastric mucosa, bx= minimally active gastritis.  Everlean Alstrom GENERIC HISTORICAL  03/06/2016   IR RADIOLOGIST EVAL & MGMT 03/06/2016 Aletta Edouard, MD [facility] INTERV RAD  . LEFT HEART CATH N/A 02/02/2012   Procedure: LEFT HEART CATH;  Surgeon: Lorretta Harp, MD;  [facility] C[facility] LAB;  Service: Cardiovascular;  Laterality: N/A;  . MEDIASTINOSCOPY     for dx sarcoid    Allergies: Azithromycin; Doxazosin; Acetaminophen; Atenolol; Hydrocodone; Levofloxacin; Morphine; Penicillins; and Sulfonamide derivatives  Medications: Prior to Admission medications   Medication Sig Start Date End Date Taking? Authorizing Provider  acetaZOLAMIDE (DIAMOX) 250 MG tablet Take 250 mg by mouth 2 (two) times daily.    Historical Provider, MD  amLODipine (NORVASC) 5 MG tablet Take 1 tablet (5 mg total) by mouth daily. Patient taking differently: Take 5 mg by mouth every morning.  09/15/15   Troy Sine, MD  aspirin 81 MG tablet Take 81 mg by mouth.  Every other day    Historical Provider, MD  atorvastatin (LIPITOR) 40 MG tablet Take 1 tablet by mouth  daily Patient taking differently: TAKE 40 MG BY MOUTH EVERY NIGHT 09/18/15   Troy Sine, MD  clopidogrel (PLAVIX) 75 MG tablet TAKE 1 TABLET BY MOUTH  DAILY Patient taking differently: TAKE 75 MG BY MOUTH ONCE EACH MORNING 03/27/16   Troy Sine, MD  dorzolamide (TRUSOPT) 2 % ophthalmic solution Place 1 drop into both eyes 2 (two) times daily.     Historical  Provider, MD  finasteride (PROSCAR) 5 MG tablet Take 5 mg by mouth daily.    Historical Provider, MD  guaiFENesin (MUCINEX) 600 MG 12 hr tablet Take 1,200 mg by mouth daily.     Historical Provider, MD  hydrochlorothiazide (HYDRODIURIL) 12.5 MG tablet TAKE 1 TABLET BY MOUTH ONCE A DAY AS NEEDED FOR FLUID  RETENTION Patient taking differently: TAKE 12.5 MG BY MOUTH EACH MORNING 09/18/15   Troy Sine, MD  isosorbide mononitrate (IMDUR) 60 MG 24 hr tablet TAKE 1 TABLET BY MOUTH IN  THE MORNING AND 1/2 TABLET  AT NIGHT Patient taking differently: TAKE 60 MG EVERY MORNING AND 30 MG EVERY NIGHT 04/30/16   Troy Sine, MD  latanoprost (XALATAN) 0.005 % ophthalmic solution Place 1 drop into both eyes at bedtime.    Historical Provider, MD  levothyroxine (SYNTHROID, LEVOTHROID) 100 MCG tablet TAKE 1 TABLET BY MOUTH  DAILY Patient taking differently: TAKE 100 MCG BY MOUTH EVERY MORNING 04/30/16   Troy Sine, MD  losartan (COZAAR) 100 MG tablet Take 1 tablet (100 mg total) by mouth daily. Patient taking differently: Take 100 mg by mouth every morning.  06/06/15   Isaiah Serge, NP  metoprolol tartrate (LOPRESSOR) 25 MG tablet TAKE ONE-HALF TABLET BY  MOUTH TWICE A DAY Patient taking differently: TAKE 12.5 MG BY MOUTH TWICE DAILY 04/30/16   Troy Sine, MD  mometasone (ELOCON) 0.1 % ointment Apply topically daily.    Historical Provider, MD  Multiple Vitamin (MULTIVITAMIN) tablet Take 1 tablet by mouth every evening.     Historical Provider, MD  Multiple Vitamins-Minerals (PRESERVISION/LUTEIN) CAPS Take 1 capsule by mouth 2 (two) times daily.     Historical Provider, MD  nitroGLYCERIN (NITROSTAT) 0.4 MG SL tablet Place 1 tablet (0.4 mg total) under the tongue every 5 (five) minutes as needed. For chest pain 03/14/16   Troy Sine, MD  NONFORMULARY OR COMPOUNDED ITEM Allergy Vaccine 1:50 Given at PCP office    Historical Provider, MD  ondansetron (ZOFRAN ODT) 4 MG disintegrating tablet 4mg  ODT  q4 hours prn nausea/vomit Patient taking differently: Take 4 mg by mouth every 4 (four) hours as needed for nausea or vomiting.  05/11/13   Elnora Morrison, MD  pantoprazole (PROTONIX) 40 MG tablet Take 1 tablet (40 mg total) by mouth 2 (two) times daily before a meal. Patient taking differently: Take 40 mg by mouth 2 (two) times daily after a meal.  08/31/15   Carlis Stable, NP  potassium chloride (K-DUR,KLOR-CON) 10 MEQ tablet Take 1 tablet by mouth  daily Patient taking differently: TAKE 10 MEQ BY MOUTH EVERY OTHER MORNING 09/18/15   Troy Sine, MD  timolol (TIMOPTIC) 0.5 % ophthalmic solution Place 1 drop into both eyes daily.     Historical Provider, MD  triamcinolone (KENALOG) 0.1 % cream Apply 1 application topically 2 (two) times daily as needed (irritation).     Historical Provider, MD  vitamin C (ASCORBIC ACID) 500 MG tablet Take 500 mg by mouth daily.     Historical Provider, MD  vitamin E 200 UNIT capsule Take 200 Units by mouth every evening.     Historical Provider, MD     Family History  Problem Relation Age of Onset  . Heart disease Father     deceased age 37  . Stroke Mother   . Alzheimer's disease Mother   . Heart attack Brother     deceased age 65  . Cancer Other     niece  . Colon cancer Neg Hx     Social History   Social History  . Marital status: Married    Spouse name: N/A  . Number of children: 1  . Years of education: N/A   Occupational History  . Retired     Visual merchandiser pumping station  .  Retired   Social History Main Topics  . Smoking status: Former Smoker    Packs/day: 0.10    Years: 2.00    Types: Cigarettes, Cigars    Quit date: 05/06/1970  . Smokeless tobacco: Never Used  . Alcohol use No  . Drug use: No  . Sexual activity: No   Other Topics Concern  . Not on file   Social History Narrative  . No narrative on file     Review of Systems: A 12 point ROS discussed and pertinent positives are indicated in the HPI above.  All other systems  are negative.  Review of Systems  Vital Signs: BP (!) 157/57 (BP Location: Right Arm)   Pulse 62   Temp 97.9 F (36.6 C) (Oral)   Resp 18   Wt 133 lb 12.8 oz (60.7 kg)   SpO2 100%   BMI 20.34 kg/m   Physical Exam  Constitutional: He is oriented to person, place, and time. He appears well-developed and well-nourished. No distress.  HENT:  Head: Normocephalic.  Mouth/Throat: Oropharynx is clear and moist.  Neck: Normal range of motion. No JVD present. No tracheal deviation present.  Cardiovascular: Normal rate and regular rhythm.   Murmur heard. Faint I/VI systolic murmur  Pulmonary/Chest: Effort normal and breath sounds normal. No respiratory distress.  Abdominal: Soft. He exhibits no mass. There is no tenderness.  Neurological: He is alert and oriented to person, place, and time.  Skin: Skin is warm and dry.  Psychiatric: He has a normal mood and affect. Judgment normal.     Imaging: Dg Chest 1 View  Result Date: 05/10/2016 CLINICAL DATA:  Right upper respiratory exam for is ablation of a left renal neoplasm. EXAM: CHEST 1 VIEW COMPARISON:  06/03/2013 FINDINGS: The heart size and mediastinal contours are within normal limits. Stable chronic lung disease and areas of bilateral pulmonary parenchymal scarring. There is no evidence of pulmonary edema, consolidation, pneumothorax, nodule or pleural fluid. The visualized skeletal structures are unremarkable. IMPRESSION: No active disease.  Stable chronic lung disease. Electronically Signed   By: Aletta Edouard M.D.   On: 05/10/2016 11:05    Labs:  CBC:  Recent Labs  09/08/15 0933 05/10/16 1030  WBC CANCELED 5.1  HGB  --  11.9*  HCT CANCELED 36.1*  PLT CANCELED 182    COAGS:  Recent Labs  05/10/16 1030  INR 1.10    BMP:  Recent Labs  09/08/15 0933 01/26/16 1009 05/10/16 1030  NA 141  --  139  K 4.6  --  3.4*  CL 102  --  111  CO2  24  --  22  GLUCOSE 99  --  96  BUN 14  --  25*  CALCIUM 9.1  --  9.2    CREATININE 0.79 0.70 0.73  GFRNONAA 82  --  >60  GFRAA 95  --  >60    LIVER FUNCTION TESTS:  Recent Labs  09/08/15 0933  BILITOT 0.7  AST 24  ALT 20  ALKPHOS 76  PROT 6.3  ALBUMIN 3.9    TUMOR MARKERS: No results for input(s): AFPTM, CEA, CA199, CHROMGRNA in the last 8760 hours.  Assessment and Plan: Left renal mass, concerning for renal cell carcinoma Plan to proceed with CT guided cryoablation today Labs reviewed, ok Risks and Benefits discussed with the patient including, but not limited to failure to treat entire lesion, bleeding, infection, damage to adjacent structures, decrease in renal function or post procedural neuropathy. All of the patient's questions were answered, patient is agreeable to proceed. Consent signed and in chart.    Thank you for this interesting consult.    A copy of this report was sent to the requesting provider on this date.  Electronically Signed: Ascencion Dike 05/17/2016, 10:53 AM   I spent a total of 20 minutes in face to face in clinical consultation, greater than 50% of which was counseling/coordinating care for left renal cryoablation procedure

## 2016-05-17 NOTE — H&P (Deleted)
  The note originally documented on this encounter has been moved the the encounter in which it belongs.  

## 2016-05-17 NOTE — Transfer of Care (Signed)
Immediate Anesthesia Transfer of Care Note  Patient: Dale [name]  Procedure(s) Performed: Procedure(s): left renal ablation (Left)  Patient Location: PACU  Anesthesia Type:General  Level of Consciousness:  alert, patient cooperative and responds to stimulation  Airway & Oxygen Therapy:Patient Spontanous Breathing and Patient connected to face mask oxgen  Post-op Assessment:  Report given to PACU RN and Post -op Vital signs reviewed and stable  Post vital signs:  Reviewed and stable  Last Vitals: There were no vitals filed for this visit.  Complications: No apparent anesthesia complications

## 2016-05-17 NOTE — Progress Notes (Signed)
Patient ID: Dale [name], male   DOB: 22-Nov-1928, 83 y.o.   MRN: [id number] Pt currently without c/o;  in PACU VSS Puncture site left flank NT; yellow urine in foley bag A/P: s/p left renal mass bx/cryo today; check am labs; for d/c in am if stable; f/u with Dr. Kathlene Cote in 4 weeks in [facility]

## 2016-05-18 DIAGNOSIS — I509 Heart failure, unspecified: Secondary | ICD-10-CM | POA: Diagnosis not present

## 2016-05-18 DIAGNOSIS — N2889 Other specified disorders of kidney and ureter: Secondary | ICD-10-CM | POA: Diagnosis not present

## 2016-05-18 DIAGNOSIS — I10 Essential (primary) hypertension: Secondary | ICD-10-CM | POA: Diagnosis not present

## 2016-05-18 DIAGNOSIS — J45909 Unspecified asthma, uncomplicated: Secondary | ICD-10-CM | POA: Diagnosis not present

## 2016-05-18 DIAGNOSIS — I2510 Atherosclerotic heart disease of native coronary artery without angina pectoris: Secondary | ICD-10-CM | POA: Diagnosis not present

## 2016-05-18 DIAGNOSIS — K219 Gastro-esophageal reflux disease without esophagitis: Secondary | ICD-10-CM | POA: Diagnosis not present

## 2016-05-18 DIAGNOSIS — C642 Malignant neoplasm of left kidney, except renal pelvis: Secondary | ICD-10-CM | POA: Diagnosis not present

## 2016-05-18 LAB — BASIC METABOLIC PANEL
Anion gap: 5 (ref 5–15)
BUN: 20 mg/dL (ref 6–20)
CALCIUM: 8.5 mg/dL — AB (ref 8.9–10.3)
CO2: 21 mmol/L — AB (ref 22–32)
Chloride: 108 mmol/L (ref 101–111)
Creatinine, Ser: 0.76 mg/dL (ref 0.61–1.24)
GFR calc Af Amer: >60 mL/min (ref >60)
GLUCOSE: 143 mg/dL — AB (ref 65–99)
Potassium: 2.8 mmol/L — ABNORMAL LOW (ref 3.5–5.1)
Sodium: 134 mmol/L — ABNORMAL LOW (ref 135–145)

## 2016-05-18 LAB — CBC
HEMATOCRIT: 31.7 % — AB (ref 39.0–52.0)
Hemoglobin: 10.9 g/dL — ABNORMAL LOW (ref 13.0–17.0)
MCH: 33.4 pg (ref 26.0–34.0)
MCHC: 34.4 g/dL (ref 30.0–36.0)
MCV: 97.2 fL (ref 78.0–100.0)
Platelets: 199 10*3/uL (ref 150–400)
RBC: 3.26 MIL/uL — ABNORMAL LOW (ref 4.22–5.81)
RDW: 13.1 % (ref 11.5–15.5)
WBC: 11.6 10*3/uL — ABNORMAL HIGH (ref 4.0–10.5)

## 2016-05-18 MED ORDER — OXYCODONE HCL 5 MG PO TABS
5.0000 mg | ORAL_TABLET | Freq: Four times a day (QID) | ORAL | Status: DC | PRN
  Administered 2016-05-18: 5 mg via ORAL
  Filled 2016-05-18: qty 1

## 2016-05-18 NOTE — Anesthesia Postprocedure Evaluation (Signed)
Anesthesia Post Note  Patient: DOMANI KIRVIN  Procedure(s) Performed: Procedure(s) (LRB): left renal ablation (Left)  Patient location during evaluation: PACU Anesthesia Type: General Level of consciousness: awake and alert Pain management: pain level controlled Vital Signs Assessment: post-procedure vital signs reviewed and stable Respiratory status: spontaneous breathing, nonlabored ventilation, respiratory function stable and patient connected to nasal cannula oxygen Cardiovascular status: blood pressure returned to baseline and stable Postop Assessment: no signs of nausea or vomiting Anesthetic complications: no       Last Vitals:  Vitals:   05/18/16 0217 05/18/16 0453  BP: (!) 119/50 (!) 113/59  Pulse: 72 (!) 110  Resp: 16 16  Temp: 36.4 C 36.1 C    Last Pain:  Vitals:   05/18/16 0455  TempSrc:   PainSc: 3                  Cavion Faiola DAVID

## 2016-05-18 NOTE — Progress Notes (Signed)
Discussed discharge instructions with patient and son, both verbalized agreement and understanding.  Patient's IV was discontinued with no complications.  Patient to go home in private vehicle with all belongings. Bandaid placed on incision.

## 2016-05-18 NOTE — Discharge Summary (Signed)
Patient ID: Dale [name] MRN: [id number] DOB/AGE: November 02, 1928 83 y.o.  Admit date: 05/17/2016 Discharge date: 05/18/2016  Supervising Physician: Aletta Edouard  Patient Status: [name] - In-pt  Admission Diagnoses: Left renal mass  Discharge Diagnoses:  Active Problems:   Left renal mass   Discharged Condition: improved  Hospital Course: Left renal mass followed by Dr [name] since 04/12/14. Growth noted in most recent follow up scan. Referred to Dr Kathlene Cote for possible treatment; consult performed 03/06/16. Cryoablation performed in IR 05/17/16. Without complication. Admitted overnight for observation. Uneventful stay. Denies N/V Afebrile. UOP great; over 1.6 L yesterday; clear yellow. Does complain of dysuria: afeb; clear yellow output anf great amount; likely from irritation of catheterization.  have seen and evaluated pt. I have discussed status with Dr Pascal Lux. Plan for discharge to home. Pt may restart Plavix 05/19/16. Continue all home meds. He has good understanding of dc plans If dysuria continues or worsens---pt knows to call IR or see Dr [name].  Consults: None  Significant Diagnostic Studies: CT abdomen  Treatments: Procedure: CT guided biopsy and cryoablation of left renal mass Findings:  18 G core biopsy x 1 of left renal mass via 17 G needle. Cryoablation of tumor with 2 Galil Ice Rod CX probes.  Discharge Exam: Blood pressure (!) 113/59, pulse (!) 110, temperature 97 F (36.1 C), temperature source Oral, resp. rate 16, height 5\' 8"  (1.727 m), weight 133 lb 13.1 oz (60.7 kg), SpO2 100 %.  PE: A/O Pleasant Complains of some urinary discomfort---no bleeding/hematuria Output great: clear yellow  Heart: RRR Lungs: CTA Abd: soft; +BS Passing gas Left flank site clean and dry NT; no bleeding No hematoma Extr: FROM Good strength =  UOP 1.6 L- clear yellow  Results for orders placed or performed during the hospital encounter of [id number]    Basic metabolic panel  Result Value Ref Range   Sodium 134 (L) 135 - 145 mmol/L   Potassium 2.8 (L) 3.5 - 5.1 mmol/L   Chloride 108 101 - 111 mmol/L   CO2 21 (L) 22 - 32 mmol/L   Glucose, Bld 143 (H) 65 - 99 mg/dL   BUN 20 6 - 20 mg/dL   Creatinine, Ser 0.76 0.61 - 1.24 mg/dL   Calcium 8.5 (L) 8.9 - 10.3 mg/dL   GFR calc non Af Amer >60 >60 mL/min   GFR calc Af Amer >60 >60 mL/min   Anion gap 5 5 - 15  CBC  Result Value Ref Range   WBC 11.6 (H) 4.0 - 10.5 K/uL   RBC 3.26 (L) 4.22 - 5.81 MIL/uL   Hemoglobin 10.9 (L) 13.0 - 17.0 g/dL   HCT 31.7 (L) 39.0 - 52.0 %   MCV 97.2 78.0 - 100.0 fL   MCH 33.4 26.0 - 34.0 pg   MCHC 34.4 30.0 - 36.0 g/dL   RDW 13.1 11.5 - 15.5 %   Platelets 199 150 - 400 K/uL     Disposition: Left renal mass cryoablation with biopsy was performed in IR 1/12 with Dr Kathlene Cote Has done well overnight/observation. Urinating well; clear yellow Some dysuria after catheterization afeb I have discussed status with Dr Pascal Lux Plan for discharge to home today after breakfast. Continue all home meds Can restart Plavix 05/19/16 Plan for 4 week follow up in IR Clinic---he will hear from scheduler for time and date. Pt has good understanding of discharge plans and instructions Please call IR of urinary symptoms continue or worsen.   Discharge Instructions  Call MD for:  difficulty breathing, headache or visual disturbances    Complete by:  As directed    Call MD for:  extreme fatigue    Complete by:  As directed    Call MD for:  hives    Complete by:  As directed    Call MD for:  persistant dizziness or light-headedness    Complete by:  As directed    Call MD for:  persistant nausea and vomiting    Complete by:  As directed    Call MD for:  redness, tenderness, or signs of infection (pain, swelling, redness, odor or green/yellow discharge around incision site)    Complete by:  As directed    Call MD for:  severe uncontrolled pain    Complete by:  As  directed    Call MD for:  temperature >100.4    Complete by:  As directed    Diet - low sodium heart healthy    Complete by:  As directed    Discharge wound care:    Complete by:  As directed    Keep clean bandaid on Left flank site daily x 1 week; may shower today   Driving Restrictions    Complete by:  As directed    No driving for 4 days   Increase activity slowly    Complete by:  As directed    Lifting restrictions    Complete by:  As directed    No lifting over 10 lbs x 4 days     Allergies as of 05/18/2016      Reactions   Azithromycin Other (See Comments)   Sore mouth and fever blisters around mouth, sores in nose area as well   Doxazosin Shortness Of Breath   Acetaminophen    REACTION: UNKNOWN REACTION   Atenolol    REACTION: UNKNOWN REACTION   Hydrocodone Nausea And Vomiting   Levofloxacin Other (See Comments)   Caused stomach problems.   Morphine    "made me crazy"   Penicillins Nausea And Vomiting   Has patient had a PCN reaction causing immediate rash, facial/tongue/throat swelling, SOB or lightheadedness with hypotension: No Has patient had a PCN reaction causing severe rash involving mucus membranes or skin necrosis: No Has patient had a PCN reaction that required hospitalization No Has patient had a PCN reaction occurring within the last 10 years: No If all of the above answers are "NO", then may proceed with Cephalosporin use.   Sulfonamide Derivatives Nausea And Vomiting      Medication List    TAKE these medications   acetaZOLAMIDE 250 MG tablet Commonly known as:  DIAMOX Take 250 mg by mouth 2 (two) times daily.   amLODipine 5 MG tablet Commonly known as:  NORVASC Take 1 tablet (5 mg total) by mouth daily. What changed:  when to take this   aspirin 81 MG tablet Take 81 mg by mouth. Every other day   atorvastatin 40 MG tablet Commonly known as:  LIPITOR Take 1 tablet by mouth  daily What changed:  See the new instructions.   clopidogrel  75 MG tablet Commonly known as:  PLAVIX TAKE 1 TABLET BY MOUTH  DAILY   dorzolamide 2 % ophthalmic solution Commonly known as:  TRUSOPT Place 1 drop into both eyes 2 (two) times daily.   finasteride 5 MG tablet Commonly known as:  PROSCAR Take 5 mg by mouth daily.   guaiFENesin 600 MG 12 hr tablet Commonly known as:  MUCINEX Take 1,200 mg by mouth daily.   hydrochlorothiazide 12.5 MG tablet Commonly known as:  HYDRODIURIL TAKE 1 TABLET BY MOUTH ONCE A DAY AS NEEDED FOR FLUID  RETENTION What changed:  See the new instructions.   isosorbide mononitrate 60 MG 24 hr tablet Commonly known as:  IMDUR TAKE 1 TABLET BY MOUTH IN  THE MORNING AND 1/2 TABLET  AT NIGHT What changed:  See the new instructions.   latanoprost 0.005 % ophthalmic solution Commonly known as:  XALATAN Place 1 drop into both eyes at bedtime.   levothyroxine 100 MCG tablet Commonly known as:  SYNTHROID, LEVOTHROID TAKE 1 TABLET BY MOUTH  DAILY What changed:  See the new instructions.   losartan 100 MG tablet Commonly known as:  COZAAR Take 1 tablet (100 mg total) by mouth daily. What changed:  when to take this   metoprolol tartrate 25 MG tablet Commonly known as:  LOPRESSOR TAKE ONE-HALF TABLET BY  MOUTH TWICE A DAY What changed:  See the new instructions.   mometasone 0.1 % ointment Commonly known as:  ELOCON Apply topically daily.   multivitamin tablet Take 1 tablet by mouth every evening.   nitroGLYCERIN 0.4 MG SL tablet Commonly known as:  NITROSTAT Place 1 tablet (0.4 mg total) under the tongue every 5 (five) minutes as needed. For chest pain   NONFORMULARY OR COMPOUNDED ITEM Allergy Vaccine 1:50 Given at PCP office   ondansetron 4 MG disintegrating tablet Commonly known as:  ZOFRAN ODT 4mg  ODT q4 hours prn nausea/vomit What changed:  how much to take  how to take this  when to take this  reasons to take this  additional instructions   pantoprazole 40 MG tablet Commonly  known as:  PROTONIX Take 1 tablet (40 mg total) by mouth 2 (two) times daily before a meal. What changed:  when to take this   potassium chloride 10 MEQ tablet Commonly known as:  K-DUR,KLOR-CON Take 1 tablet by mouth  daily What changed:  See the new instructions.   PRESERVISION/LUTEIN Caps Take 1 capsule by mouth 2 (two) times daily.   timolol 0.5 % ophthalmic solution Commonly known as:  TIMOPTIC Place 1 drop into both eyes daily.   triamcinolone cream 0.1 % Commonly known as:  KENALOG Apply 1 application topically 2 (two) times daily as needed (irritation).   vitamin C 500 MG tablet Commonly known as:  ASCORBIC ACID Take 500 mg by mouth daily.   vitamin E 200 UNIT capsule Take 200 Units by mouth every evening.      Follow-up Information    YAMAGATA,GLENN T, MD Follow up in 4 week(s).   Specialty:  Interventional Radiology Why:  pt to hear from IR clinic for appt time and date; call [phone] if questions or concerns Contact information: [address] [place] [address] [phone]            Electronically Signed: Monia Sabal A 05/18/2016, 9:33 AM   I have spent Greater Than 30 Minutes discharging Dale [name].

## 2016-05-21 ENCOUNTER — Telehealth: Payer: Self-pay | Admitting: Radiology

## 2016-05-21 DIAGNOSIS — N2889 Other specified disorders of kidney and ureter: Secondary | ICD-10-CM | POA: Diagnosis not present

## 2016-05-21 DIAGNOSIS — Z681 Body mass index (BMI) 19 or less, adult: Secondary | ICD-10-CM | POA: Diagnosis not present

## 2016-05-21 DIAGNOSIS — Z1389 Encounter for screening for other disorder: Secondary | ICD-10-CM | POA: Diagnosis not present

## 2016-05-21 DIAGNOSIS — N390 Urinary tract infection, site not specified: Secondary | ICD-10-CM | POA: Diagnosis not present

## 2016-05-21 NOTE — Telephone Encounter (Signed)
Patient called w/ complaint of urinary frequency & burning w/ urination.  Afebrile.  (Hx:  Left Renal Cryoablation 05/17/2016; foley cath during procedure).   Dr Kathlene Cote notified & requested UA w/ C&S.  Patient to be seen today by PA-C at his primary care physician's office (Dr. Sharilyn Sites) in [place].    Nai Dasch Riki Rusk, RN 05/21/2016 11:48 AM

## 2016-05-23 ENCOUNTER — Other Ambulatory Visit: Payer: Self-pay | Admitting: Radiology

## 2016-05-23 ENCOUNTER — Encounter: Payer: Self-pay | Admitting: Radiology

## 2016-05-23 DIAGNOSIS — N2889 Other specified disorders of kidney and ureter: Secondary | ICD-10-CM

## 2016-05-24 DIAGNOSIS — J309 Allergic rhinitis, unspecified: Secondary | ICD-10-CM | POA: Diagnosis not present

## 2016-05-28 DIAGNOSIS — H401131 Primary open-angle glaucoma, bilateral, mild stage: Secondary | ICD-10-CM | POA: Diagnosis not present

## 2016-05-29 ENCOUNTER — Other Ambulatory Visit: Payer: Self-pay | Admitting: Cardiology

## 2016-06-13 DIAGNOSIS — N2889 Other specified disorders of kidney and ureter: Secondary | ICD-10-CM | POA: Diagnosis not present

## 2016-06-15 ENCOUNTER — Other Ambulatory Visit: Payer: Self-pay | Admitting: Cardiovascular Disease

## 2016-06-18 ENCOUNTER — Ambulatory Visit
Admission: RE | Admit: 2016-06-18 | Discharge: 2016-06-18 | Disposition: A | Discharge status: Home / Self Care | Payer: Medicare Other | Source: Ambulatory Visit | Attending: Radiology | Admitting: Radiology

## 2016-06-18 ENCOUNTER — Encounter: Payer: Self-pay | Admitting: Radiology

## 2016-06-18 DIAGNOSIS — N2889 Other specified disorders of kidney and ureter: Secondary | ICD-10-CM

## 2016-06-18 HISTORY — PX: IR GENERIC HISTORICAL: IMG1180011

## 2016-06-18 NOTE — Progress Notes (Signed)
Chief Complaint: Status post percutaneous cryoablation of a left renal clear cell carcinoma on 05/17/2016.  History of Present Illness: Dale [name] is an 83 y.o. male status post cryoablation of a 1.8 cm left renal carcinoma 4 weeks ago. Biopsy at the time of ablation demonstrated a clear cell carcinoma, nuclear grade 2. Dale [name] did well initially and was discharged after overnight observation. He did develop signs of a urinary tract infection with burning and increased urinary frequency approximately 3 days after discharge and was treated with oral antibiotics with resolution of symptoms.  He denies any current symptoms.  Past Medical History:  Diagnosis Date  . Allergic rhinitis   . Anal fissure   . Anginal pain (Dale [name])   . Asthma   . Cancer ([place])    skin  . Chest pain   . CHF (congestive heart failure) ([place])   . Coronary heart disease    s/p stenting. cath in 01/2012 noncritical occlusion  . Dysrhythmia    1st degree heart block  . GERD (gastroesophageal reflux disease)   . Glaucoma   . Hiatal hernia   . Hyperlipidemia   . Hypertension   . Hypothyroidism   . Idiopathic thrombocytopenic purpura (Dale [name]) 2002  . Macular degeneration   . Nephrolithiasis   . PUD (peptic ulcer disease)    remote  . 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 Schatzki's ring     Past Surgical History:  Procedure Laterality Date  . cardiac stents    . COLONOSCOPY  10/30/2006   Normal rectum, sigmoid diverticula.Remainder of colonic mucosa appeared normal.  . CORONARY ANGIOPLASTY WITH STENT PLACEMENT     about 10 years ago per pt (around 2007)  . ESOPHAGOGASTRODUODENOSCOPY  06/19/2004   Two esophageal rings and esophageal web as described above.  All of these were disrupted by passing 56-French Venia Minks dilator/ Candida esophagitis,which appears to be incidental given history of   antibiotic use, but nevertheless will be treated.  . ESOPHAGOGASTRODUODENOSCOPY  10/30/2006   Distal tandem  esophageal ring status post dilation disruption as  described above.  Otherwise normal esophagus/  Small hiatal hernia otherwise normal stomach, D1 and D2  . ESOPHAGOGASTRODUODENOSCOPY N/A 03/22/2015   Dr.Rourk- noncritical schatzki's ring and hiatal hernia-o/w normal EGD.   Marland Kitchen ESOPHAGOGASTRODUODENOSCOPY (EGD) WITH ESOPHAGEAL DILATION  03/04/2012   RMR- schatzki's ring, hiatal hernia, polypoid gastric mucosa, bx= minimally active gastritis.  Everlean Alstrom GENERIC HISTORICAL  03/06/2016   IR RADIOLOGIST EVAL & MGMT 03/06/2016 Aletta Edouard, MD [facility] INTERV RAD  . IR GENERIC HISTORICAL  06/18/2016   IR RADIOLOGIST EVAL & MGMT 06/18/2016 Aletta Edouard, MD [facility] INTERV RAD  . LEFT HEART CATH N/A 02/02/2012   Procedure: LEFT HEART CATH;  Surgeon: Lorretta Harp, MD;  Location[facility] CATH LAB;  Service: Cardiovascular;  Laterality: N/A;  . MEDIASTINOSCOPY     for dx sarcoid    Allergies: Azithromycin; Doxazosin; Acetaminophen; Atenolol; Hydrocodone; Levofloxacin; Morphine; Penicillins; and Sulfonamide derivatives  Medications: Prior to Admission medications   Medication Sig Start Date End Date Taking? Authorizing Provider  acetaZOLAMIDE (DIAMOX) 250 MG tablet Take 250 mg by mouth 2 (two) times daily.    Historical Provider, MD  amLODipine (NORVASC) 5 MG tablet Take 1 tablet (5 mg total) by mouth daily. Patient taking differently: Take 5 mg by mouth every morning.  09/15/15   Troy Sine, MD  aspirin 81 MG tablet Take 81 mg by mouth. Every other day    Historical Provider,  MD  atorvastatin (LIPITOR) 40 MG tablet TAKE 1 TABLET BY MOUTH  DAILY 06/17/16   Troy Sine, MD  clopidogrel (PLAVIX) 75 MG tablet TAKE 1 TABLET BY MOUTH  DAILY 03/27/16   Troy Sine, MD  dorzolamide (TRUSOPT) 2 % ophthalmic solution Place 1 drop into both eyes 2 (two) times daily.     Historical Provider, MD  finasteride (PROSCAR) 5 MG tablet Take 5 mg by mouth daily.    Historical Provider, MD  guaiFENesin (MUCINEX) 600 MG 12  hr tablet Take 1,200 mg by mouth daily.     Historical Provider, MD  hydrochlorothiazide (HYDRODIURIL) 12.5 MG tablet TAKE 1 TABLET BY MOUTH ONCE A DAY AS NEEDED FOR FLUID  RETENTION 06/17/16   Troy Sine, MD  isosorbide mononitrate (IMDUR) 60 MG 24 hr tablet TAKE 1 TABLET BY MOUTH IN  THE MORNING AND 1/2 TABLET  AT NIGHT Patient taking differently: TAKE 60 MG EVERY MORNING AND 30 MG EVERY NIGHT 04/30/16   Troy Sine, MD  latanoprost (XALATAN) 0.005 % ophthalmic solution Place 1 drop into both eyes at bedtime.    Historical Provider, MD  levothyroxine (SYNTHROID, LEVOTHROID) 100 MCG tablet TAKE 1 TABLET BY MOUTH  DAILY Patient taking differently: TAKE 100 MCG BY MOUTH EVERY MORNING 04/30/16   Troy Sine, MD  losartan (COZAAR) 100 MG tablet TAKE 1 TABLET BY MOUTH  DAILY 05/30/16   Troy Sine, MD  metoprolol tartrate (LOPRESSOR) 25 MG tablet TAKE ONE-HALF TABLET BY  MOUTH TWICE A DAY Patient taking differently: TAKE 12.5 MG BY MOUTH TWICE DAILY 04/30/16   Troy Sine, MD  mometasone (ELOCON) 0.1 % ointment Apply topically daily.    Historical Provider, MD  Multiple Vitamin (MULTIVITAMIN) tablet Take 1 tablet by mouth every evening.     Historical Provider, MD  Multiple Vitamins-Minerals (PRESERVISION/LUTEIN) CAPS Take 1 capsule by mouth 2 (two) times daily.     Historical Provider, MD  nitroGLYCERIN (NITROSTAT) 0.4 MG SL tablet Place 1 tablet (0.4 mg total) under the tongue every 5 (five) minutes as needed. For chest pain 03/14/16   Troy Sine, MD  NONFORMULARY OR COMPOUNDED ITEM Allergy Vaccine 1:50 Given at PCP office    Historical Provider, MD  ondansetron (ZOFRAN ODT) 4 MG disintegrating tablet 4mg  ODT q4 hours prn nausea/vomit Patient taking differently: Take 4 mg by mouth every 4 (four) hours as needed for nausea or vomiting.  05/11/13   Elnora Morrison, MD  pantoprazole (PROTONIX) 40 MG tablet Take 1 tablet (40 mg total) by mouth 2 (two) times daily before a meal. Patient  taking differently: Take 40 mg by mouth 2 (two) times daily after a meal.  08/31/15   Carlis Stable, NP  potassium chloride (K-DUR,KLOR-CON) 10 MEQ tablet Take 1 tablet by mouth  daily Patient taking differently: TAKE 10 MEQ BY MOUTH EVERY OTHER MORNING 09/18/15   Troy Sine, MD  timolol (TIMOPTIC) 0.5 % ophthalmic solution Place 1 drop into both eyes daily.     Historical Provider, MD  triamcinolone (KENALOG) 0.1 % cream Apply 1 application topically 2 (two) times daily as needed (irritation).     Historical Provider, MD  vitamin C (ASCORBIC ACID) 500 MG tablet Take 500 mg by mouth daily.     Historical Provider, MD  vitamin E 200 UNIT capsule Take 200 Units by mouth every evening.     Historical Provider, MD     Family History  Problem Relation Age of Onset  .  Heart disease Father     deceased age 51  . Stroke Mother   . Alzheimer's disease Mother   . Heart attack Brother     deceased age 41  . Cancer Other     niece  . Colon cancer Neg Hx     Social History   Social History  . Marital status: Married    Spouse name: N/A  . Number of children: 1  . Years of education: N/A   Occupational History  . Retired     Visual merchandiser pumping station  .  Retired   Social History Main Topics  . Smoking status: Former Smoker    Packs/day: 0.10    Years: 2.00    Types: Cigarettes, Cigars    Quit date: 05/06/1970  . Smokeless tobacco: Never Used  . Alcohol use No  . Drug use: No  . Sexual activity: No   Other Topics Concern  . Not on file   Social History Narrative  . No narrative on file    Review of Systems: A 12 point ROS discussed and pertinent positives are indicated in the HPI above.  All other systems are negative.  Review of Systems  Constitutional: Negative.   HENT: Negative.   Respiratory: Negative.   Cardiovascular: Negative.   Gastrointestinal: Negative.   Genitourinary: Negative.   Musculoskeletal: Negative.   Neurological: Negative.     Vital Signs: BP  119/60 (BP Location: Left Arm, Patient Position: Sitting, Cuff Size: Normal)   Pulse (!) 57   Temp 97.7 F (36.5 C) (Oral)   Resp 14   Ht 5\' 8"  (1.727 m)   Wt 132 lb (59.9 kg)   SpO2 100%   BMI 20.07 kg/m   Physical Exam  Constitutional: He is oriented to person, place, and time. No distress.  Abdominal: Soft. He exhibits no distension. There is no tenderness. There is no rebound and no guarding.  Musculoskeletal: He exhibits no edema.  Neurological: He is alert and oriented to person, place, and time.  Skin: He is not diaphoretic.    Labs:  CBC:  Recent Labs  09/08/15 0933 05/10/16 1030 05/18/16 0550  WBC CANCELED 5.1 11.6*  HGB  --  11.9* 10.9*  HCT CANCELED 36.1* 31.7*  PLT CANCELED 182 199    COAGS:  Recent Labs  05/10/16 1030  INR 1.10    BMP:  BUN 15, Cr 0.76, GFR 83 mL/min on 06/13/16  Recent Labs  09/08/15 0933 01/26/16 1009 05/10/16 1030 05/18/16 0550  NA 141  --  139 134*  K 4.6  --  3.4* 2.8*  CL 102  --  111 108  CO2 24  --  22 21*  GLUCOSE 99  --  96 143*  BUN 14  --  25* 20  CALCIUM 9.1  --  9.2 8.5*  CREATININE 0.79 0.70 0.73 0.76  GFRNONAA 82  --  >60 >60  GFRAA 95  --  >60 >60    LIVER FUNCTION TESTS:  Recent Labs  09/08/15 0933  BILITOT 0.7  AST 24  ALT 20  ALKPHOS 76  PROT 6.3  ALBUMIN 3.9    Assessment and Plan:  Mr. Aardema is doing well one month post ablation. He did develop a urinary tract infection after the procedure, likely related to placement of a Foley catheter. Symptoms related to the UTI have resolved after antibiotic treatment. Renal function is normal. I recommended a follow-up CT of the abdomen to assess the ablated kidney  in late May or early April of this year. I will meet back with Mr. Parra after the CT is performed to review findings with him.  Electronically SignedAletta Edouard T 06/18/2016, 11:16 AM   I spent a total of 15 Minutes in face to face in clinical consultation, greater than 50% of  which was counseling/coordinating care for post ablation of a left renal carcinoma.

## 2016-06-19 ENCOUNTER — Other Ambulatory Visit: Payer: Medicare Other

## 2016-06-20 DIAGNOSIS — J309 Allergic rhinitis, unspecified: Secondary | ICD-10-CM | POA: Diagnosis not present

## 2016-07-08 ENCOUNTER — Other Ambulatory Visit: Payer: Self-pay | Admitting: Cardiovascular Disease

## 2016-07-08 NOTE — Telephone Encounter (Signed)
Rx has been sent to the pharmacy electronically. ° °

## 2016-07-18 DIAGNOSIS — H1131 Conjunctival hemorrhage, right eye: Secondary | ICD-10-CM | POA: Diagnosis not present

## 2016-07-30 ENCOUNTER — Encounter (HOSPITAL_COMMUNITY): Payer: Self-pay | Admitting: Interventional Radiology

## 2016-08-14 ENCOUNTER — Other Ambulatory Visit (HOSPITAL_COMMUNITY): Payer: Self-pay | Admitting: Interventional Radiology

## 2016-08-14 ENCOUNTER — Other Ambulatory Visit: Payer: Self-pay | Admitting: Urology

## 2016-08-14 DIAGNOSIS — N2889 Other specified disorders of kidney and ureter: Secondary | ICD-10-CM

## 2016-08-19 ENCOUNTER — Other Ambulatory Visit: Payer: Self-pay | Admitting: *Deleted

## 2016-08-19 DIAGNOSIS — N2889 Other specified disorders of kidney and ureter: Secondary | ICD-10-CM

## 2016-08-20 ENCOUNTER — Ambulatory Visit (INDEPENDENT_AMBULATORY_CARE_PROVIDER_SITE_OTHER): Payer: Medicare Other | Admitting: Urology

## 2016-08-20 DIAGNOSIS — N400 Enlarged prostate without lower urinary tract symptoms: Secondary | ICD-10-CM

## 2016-08-20 DIAGNOSIS — C642 Malignant neoplasm of left kidney, except renal pelvis: Secondary | ICD-10-CM | POA: Diagnosis not present

## 2016-08-26 DIAGNOSIS — H04123 Dry eye syndrome of bilateral lacrimal glands: Secondary | ICD-10-CM | POA: Diagnosis not present

## 2016-09-03 ENCOUNTER — Other Ambulatory Visit: Payer: Medicare Other

## 2016-09-03 ENCOUNTER — Other Ambulatory Visit (HOSPITAL_COMMUNITY): Payer: Medicare Other

## 2016-09-12 ENCOUNTER — Other Ambulatory Visit (HOSPITAL_COMMUNITY)
Admission: RE | Admit: 2016-09-12 | Discharge: 2016-09-12 | Disposition: A | Discharge status: Home / Self Care | Payer: Medicare Other | Source: Ambulatory Visit | Attending: Interventional Radiology | Admitting: Interventional Radiology

## 2016-09-12 DIAGNOSIS — N2889 Other specified disorders of kidney and ureter: Secondary | ICD-10-CM | POA: Diagnosis not present

## 2016-09-12 LAB — CREATININE, SERUM
CREATININE: 0.69 mg/dL (ref 0.61–1.24)
GFR calc Af Amer: >60 mL/min (ref >60)

## 2016-09-12 LAB — BUN: BUN: 21 mg/dL — ABNORMAL HIGH (ref 6–20)

## 2016-09-16 DIAGNOSIS — X32XXXD Exposure to sunlight, subsequent encounter: Secondary | ICD-10-CM | POA: Diagnosis not present

## 2016-09-16 DIAGNOSIS — D225 Melanocytic nevi of trunk: Secondary | ICD-10-CM | POA: Diagnosis not present

## 2016-09-16 DIAGNOSIS — L820 Inflamed seborrheic keratosis: Secondary | ICD-10-CM | POA: Diagnosis not present

## 2016-09-16 DIAGNOSIS — Z85828 Personal history of other malignant neoplasm of skin: Secondary | ICD-10-CM | POA: Diagnosis not present

## 2016-09-16 DIAGNOSIS — L570 Actinic keratosis: Secondary | ICD-10-CM | POA: Diagnosis not present

## 2016-09-16 DIAGNOSIS — Z08 Encounter for follow-up examination after completed treatment for malignant neoplasm: Secondary | ICD-10-CM | POA: Diagnosis not present

## 2016-09-24 DIAGNOSIS — H04123 Dry eye syndrome of bilateral lacrimal glands: Secondary | ICD-10-CM | POA: Diagnosis not present

## 2016-09-24 DIAGNOSIS — H01025 Squamous blepharitis left lower eyelid: Secondary | ICD-10-CM | POA: Diagnosis not present

## 2016-09-24 DIAGNOSIS — H01024 Squamous blepharitis left upper eyelid: Secondary | ICD-10-CM | POA: Diagnosis not present

## 2016-09-24 DIAGNOSIS — H01021 Squamous blepharitis right upper eyelid: Secondary | ICD-10-CM | POA: Diagnosis not present

## 2016-09-24 DIAGNOSIS — H01022 Squamous blepharitis right lower eyelid: Secondary | ICD-10-CM | POA: Diagnosis not present

## 2016-09-24 DIAGNOSIS — H10413 Chronic giant papillary conjunctivitis, bilateral: Secondary | ICD-10-CM | POA: Diagnosis not present

## 2016-10-01 ENCOUNTER — Ambulatory Visit (HOSPITAL_COMMUNITY)
Admission: RE | Admit: 2016-10-01 | Discharge: 2016-10-01 | Disposition: A | Discharge status: Home / Self Care | Payer: Medicare Other | Source: Ambulatory Visit | Attending: Interventional Radiology | Admitting: Interventional Radiology

## 2016-10-01 ENCOUNTER — Ambulatory Visit
Admission: RE | Admit: 2016-10-01 | Discharge: 2016-10-01 | Disposition: A | Discharge status: Home / Self Care | Payer: Medicare Other | Source: Ambulatory Visit | Attending: Interventional Radiology | Admitting: Interventional Radiology

## 2016-10-01 DIAGNOSIS — N2889 Other specified disorders of kidney and ureter: Secondary | ICD-10-CM

## 2016-10-01 DIAGNOSIS — C642 Malignant neoplasm of left kidney, except renal pelvis: Secondary | ICD-10-CM | POA: Diagnosis not present

## 2016-10-01 HISTORY — PX: IR RADIOLOGIST EVAL & MGMT: IMG5224

## 2016-10-01 MED ORDER — IOPAMIDOL (ISOVUE-300) INJECTION 61%
100.0000 mL | Freq: Once | INTRAVENOUS | Status: AC | PRN
  Administered 2016-10-01: 100 mL via INTRAVENOUS

## 2016-10-01 MED ORDER — IOPAMIDOL (ISOVUE-300) INJECTION 61%
INTRAVENOUS | Status: AC
  Filled 2016-10-01: qty 100

## 2016-10-07 NOTE — Progress Notes (Signed)
Chief Complaint: Status post percutaneous cryoablation of a left clear cell renal carcinoma on 05/17/2016.  History of Present Illness: Dale [name] is a 83 y.o. male status post cryoablation of a left renal carcinoma on 05/17/2016. Biopsy at the time of treatment demonstrated a clear cell carcinoma, nuclear grade 2. Dale [name] did quite well following treatment with no pain or urinary symptoms. Dale [name] is currently asymptomatic. Dale [name] does state some recent weight loss but feels that his appetite and diet has been normal.  Past Medical History:  Diagnosis Date  . Allergic rhinitis   . Anal fissure   . Anginal pain ([place])   . Asthma   . Cancer ([place])    skin  . Chest pain   . CHF (congestive heart failure) ([place])   . Coronary heart disease    s/p stenting. cath in 01/2012 noncritical occlusion  . Dysrhythmia    1st degree heart block  . GERD (gastroesophageal reflux disease)   . Glaucoma   . Hiatal hernia   . Hyperlipidemia   . Hypertension   . Hypothyroidism   . Idiopathic thrombocytopenic purpura ([place]) 2002  . Macular degeneration   . Nephrolithiasis   . PUD (peptic ulcer disease)    remote  . Sarcoidosis ([place])    pulmonary  . Schatzki's ring     Past Surgical History:  Procedure Laterality Date  . cardiac stents    . COLONOSCOPY  10/30/2006   Normal rectum, sigmoid diverticula.Remainder of colonic mucosa appeared normal.  . CORONARY ANGIOPLASTY WITH STENT PLACEMENT     about 10 years ago per pt (around 2007)  . ESOPHAGOGASTRODUODENOSCOPY  06/19/2004   Two esophageal rings and esophageal web as described above.  All of these were disrupted by passing 56-French Venia Minks dilator/ Candida esophagitis,which appears to be incidental given history of   antibiotic use, but nevertheless will be treated.  . ESOPHAGOGASTRODUODENOSCOPY  10/30/2006   Distal tandem esophageal ring status post dilation disruption as  described above.  Otherwise normal esophagus/  Small hiatal hernia  otherwise normal stomach, D1 and D2  . ESOPHAGOGASTRODUODENOSCOPY N/A 03/22/2015   Dr.Rourk- noncritical schatzki's ring and hiatal hernia-o/w normal EGD.   Marland Kitchen ESOPHAGOGASTRODUODENOSCOPY (EGD) WITH ESOPHAGEAL DILATION  03/04/2012   RMR- schatzki's ring, hiatal hernia, polypoid gastric mucosa, bx= minimally active gastritis.  Everlean Alstrom GENERIC HISTORICAL  03/06/2016   IR RADIOLOGIST EVAL & MGMT 03/06/2016 Aletta Edouard, MD [facility] INTERV RAD  . IR GENERIC HISTORICAL  06/18/2016   IR RADIOLOGIST EVAL & MGMT 06/18/2016 Aletta Edouard, MD [facility] INTERV RAD  . LEFT HEART CATH N/A 02/02/2012   Procedure: LEFT HEART CATH;  Surgeon: Lorretta Harp, MD;  Location: [facility] CATH LAB;  Service: Cardiovascular;  Laterality: N/A;  . MEDIASTINOSCOPY     for dx sarcoid  . RADIOLOGY WITH ANESTHESIA Left 05/17/2016   Procedure: left renal ablation;  Surgeon: Aletta Edouard, MD;  Location: [facility] ORS;  Service: Radiology;  Laterality: Left;    Allergies: Azithromycin; Doxazosin; Acetaminophen; Atenolol; Hydrocodone; Levofloxacin; Morphine; Penicillins; and Sulfonamide derivatives  Medications: Prior to Admission medications   Medication Sig Start Date End Date Taking? Authorizing Provider  acetaZOLAMIDE (DIAMOX) 250 MG tablet Take 250 mg by mouth 2 (two) times daily.    [provider]  amLODipine (NORVASC) 5 MG tablet TAKE 1 TABLET BY MOUTH  DAILY 07/08/16   Troy Sine, MD  aspirin 81 MG tablet Take 81 mg by mouth. Every other day    [provider]  atorvastatin (LIPITOR) 40 MG tablet TAKE 1 TABLET BY MOUTH  DAILY 06/17/16   Troy Sine, MD  clopidogrel (PLAVIX) 75 MG tablet TAKE 1 TABLET BY MOUTH  DAILY 03/27/16   Troy Sine, MD  dorzolamide (TRUSOPT) 2 % ophthalmic solution Place 1 drop into both eyes 2 (two) times daily.     [provider]  finasteride (PROSCAR) 5 MG tablet Take 5 mg by mouth daily.    [provider]  guaiFENesin (MUCINEX) 600 MG 12 hr tablet Take 1,200  mg by mouth daily.     [provider]  hydrochlorothiazide (HYDRODIURIL) 12.5 MG tablet TAKE 1 TABLET BY MOUTH ONCE A DAY AS NEEDED FOR FLUID  RETENTION 06/17/16   Troy Sine, MD  isosorbide mononitrate (IMDUR) 60 MG 24 hr tablet TAKE 1 TABLET BY MOUTH IN  THE MORNING AND 1/2 TABLET  AT NIGHT Patient taking differently: TAKE 60 MG EVERY MORNING AND 30 MG EVERY NIGHT 04/30/16   Troy Sine, MD  latanoprost (XALATAN) 0.005 % ophthalmic solution Place 1 drop into both eyes at bedtime.    [provider]  levothyroxine (SYNTHROID, LEVOTHROID) 100 MCG tablet TAKE 1 TABLET BY MOUTH  DAILY Patient taking differently: TAKE 100 MCG BY MOUTH EVERY MORNING 04/30/16   Troy Sine, MD  losartan (COZAAR) 100 MG tablet TAKE 1 TABLET BY MOUTH  DAILY 05/30/16   Troy Sine, MD  metoprolol tartrate (LOPRESSOR) 25 MG tablet TAKE ONE-HALF TABLET BY  MOUTH TWICE A DAY Patient taking differently: TAKE 12.5 MG BY MOUTH TWICE DAILY 04/30/16   Troy Sine, MD  mometasone (ELOCON) 0.1 % ointment Apply topically daily.    [provider]  Multiple Vitamin (MULTIVITAMIN) tablet Take 1 tablet by mouth every evening.     [provider]  Multiple Vitamins-Minerals (PRESERVISION/LUTEIN) CAPS Take 1 capsule by mouth 2 (two) times daily.     [provider]  nitroGLYCERIN (NITROSTAT) 0.4 MG SL tablet Place 1 tablet (0.4 mg total) under the tongue every 5 (five) minutes as needed. For chest pain 03/14/16   Troy Sine, MD  NONFORMULARY OR COMPOUNDED ITEM Allergy Vaccine 1:50 Given at PCP office    [provider]  ondansetron (ZOFRAN ODT) 4 MG disintegrating tablet 4mg  ODT q4 hours prn nausea/vomit Patient taking differently: Take 4 mg by mouth every 4 (four) hours as needed for nausea or vomiting.  05/11/13   Elnora Morrison, MD  pantoprazole (PROTONIX) 40 MG tablet Take 1 tablet (40 mg total) by mouth 2 (two) times daily before a meal. Patient taking  differently: Take 40 mg by mouth 2 (two) times daily after a meal.  08/31/15   Carlis Stable, NP  potassium chloride (K-DUR,KLOR-CON) 10 MEQ tablet Take 1 tablet by mouth  daily Patient taking differently: TAKE 10 MEQ BY MOUTH EVERY OTHER MORNING 09/18/15   Troy Sine, MD  timolol (TIMOPTIC) 0.5 % ophthalmic solution Place 1 drop into both eyes daily.     [provider]  triamcinolone (KENALOG) 0.1 % cream Apply 1 application topically 2 (two) times daily as needed (irritation).     [provider]  vitamin C (ASCORBIC ACID) 500 MG tablet Take 500 mg by mouth daily.     [provider]  vitamin E 200 UNIT capsule Take 200 Units by mouth every evening.     [provider]     Family History  Problem Relation Age of Onset  .  Heart disease Father        deceased age 69  . Stroke Mother   . Alzheimer's disease Mother   . Heart attack Brother        deceased age 106  . Cancer Other        niece  . Colon cancer Neg Hx     Social History   Social History  . Marital status: Married    Spouse name: N/A  . Number of children: 1  . Years of education: N/A   Occupational History  . Retired     Visual merchandiser pumping station  .  Retired   Social History Main Topics  . Smoking status: Former Smoker    Packs/day: 0.10    Years: 2.00    Types: Cigarettes, Cigars    Quit date: 05/06/1970  . Smokeless tobacco: Never Used  . Alcohol use No  . Drug use: No  . Sexual activity: No   Other Topics Concern  . Not on file   Social History Narrative  . No narrative on file    ECOG Status: 0 - Asymptomatic  Review of Systems: A 12 point ROS discussed and pertinent positives are indicated in the HPI above.  All other systems are negative.  Review of Systems  Constitutional: Negative.   Respiratory: Negative.   Cardiovascular: Negative.   Gastrointestinal: Negative.   Genitourinary: Negative.   Musculoskeletal: Negative.   Neurological: Negative.       Vital Signs: BP 132/67   Pulse 68   Temp 98 F (36.7 C) (Oral)   Resp 15   Ht 5\' 8"  (1.727 m)   Wt 124 lb (56.2 kg)   SpO2 100%   BMI 18.85 kg/m   Physical Exam  Constitutional: Dale [name] is oriented to person, place, and time. No distress.  Abdominal: Soft. Dale [name] exhibits no distension. There is no tenderness. There is no rebound and no guarding.  Neurological: Dale [name] is alert and oriented to person, place, and time.  Skin: Dale [name] is not diaphoretic.  Vitals reviewed.   Imaging: Ct Abdomen W Wo Contrast  Result Date: 10/01/2016 CLINICAL DATA:  Cryoablation of left renal cell carcinoma on 05/17/2016. EXAM: CT ABDOMEN WITHOUT AND WITH CONTRAST TECHNIQUE: Multidetector CT imaging of the abdomen was performed following the standard protocol before and following the bolus administration of intravenous contrast. CONTRAST:  159mL ISOVUE-300 IOPAMIDOL (ISOVUE-300) INJECTION 61% COMPARISON:  Ablation study from 05/17/2016. Diagnostic CT 01/26/2016. FINDINGS: Lower chest:  Unremarkable. Hepatobiliary: Stable appearance of multiple hepatic cysts. No suspicious focal abnormality within the liver parenchyma. Subtle small focus of gallbladder wall thickening in the fundus likely related to adenomyomatosis. No intrahepatic or extrahepatic biliary dilation. Pancreas: No focal mass lesion. No dilatation of the main duct. No intraparenchymal cyst. No peripancreatic edema. Spleen: No splenomegaly. No focal mass lesion. Adrenals/Urinary Tract: No right adrenal nodule or mass. 9 mm posterior left adrenal nodule stable 04/12/2014 and likely adenoma. Multiple small cortical cysts noted right kidney, the largest of which is in the lower pole without substantial change at 8 mm. Interval treatment of the enhancing interpolar left renal lesion. Granulation/ scarring now evident at the site of previous neoplasm. As this is the first post treatment scan, it establishes a new baseline in this individual for follow-up imaging. No  suspicious features on today's study to suggest residual or recurrent disease. Stomach/Bowel: Stomach is nondistended. No gastric wall thickening. No evidence of outlet obstruction. Duodenum is normally positioned as is the ligament of Treitz.  Visualize small bowel loops and colonic segments of the abdomen are unremarkable. Vascular/Lymphatic: There is abdominal aortic atherosclerosis without aneurysm. There is no gastrohepatic or hepatoduodenal ligament lymphadenopathy. No intraperitoneal or retroperitoneal lymphadenopathy. Other: No intraperitoneal free fluid. Musculoskeletal: Bone windows reveal no worrisome lytic or sclerotic osseous lesions. IMPRESSION: 1. Status post interval ablation of the 1.8 cm left renal mass. Expected post treatment granulation/scarring now identified in the ablation bed with no features to suggest residual or recurrent disease. 2. No retroperitoneal lymphadenopathy in the visualized abdomen. 3. Tiny posterior left adrenal nodule unchanged since 2015 and likely adenoma. Electronically Signed   By: Misty Stanley M.D.   On: 10/01/2016 11:31    Labs:  CBC:  Recent Labs  05/10/16 1030 05/18/16 0550  WBC 5.1 11.6*  HGB 11.9* 10.9*  HCT 36.1* 31.7*  PLT 182 199    COAGS:  Recent Labs  05/10/16 1030  INR 1.10    BMP:  Recent Labs  01/26/16 1009 05/10/16 1030 05/18/16 0550 09/12/16 1220  NA  --  139 134*  --   K  --  3.4* 2.8*  --   CL  --  111 108  --   CO2  --  22 21*  --   GLUCOSE  --  96 143*  --   BUN  --  25* 20 21*  CALCIUM  --  9.2 8.5*  --   CREATININE 0.70 0.73 0.76 0.69  GFRNONAA  --  >60 >60 >60  GFRAA  --  >60 >60 >60    Assessment and Plan:  Renal function is stable and normal. Follow-up CT demonstrates a well-circumscribed avascular ablation defect at the site of the treated left anterior renal carcinoma with no evidence of residual tumor enhancement. Given appearance, I recommended that we wait 12 months to perform a follow-up CT  examination. Dale [name] is agreeable. We will check his renal function just prior to that CT study.  Electronically SignedAletta Edouard T 10/07/2016, 8:08 AM   I spent a total of 10 Minutes in face to face in clinical consultation, greater than 50% of which was counseling/coordinating care post cryoablation of a left renal carcinoma.

## 2016-10-18 ENCOUNTER — Encounter: Payer: Self-pay | Admitting: Interventional Radiology

## 2016-10-31 DIAGNOSIS — Z681 Body mass index (BMI) 19 or less, adult: Secondary | ICD-10-CM | POA: Diagnosis not present

## 2016-10-31 DIAGNOSIS — C642 Malignant neoplasm of left kidney, except renal pelvis: Secondary | ICD-10-CM | POA: Diagnosis not present

## 2016-10-31 DIAGNOSIS — R634 Abnormal weight loss: Secondary | ICD-10-CM | POA: Diagnosis not present

## 2016-10-31 DIAGNOSIS — K219 Gastro-esophageal reflux disease without esophagitis: Secondary | ICD-10-CM | POA: Diagnosis not present

## 2016-11-01 DIAGNOSIS — Z1389 Encounter for screening for other disorder: Secondary | ICD-10-CM | POA: Diagnosis not present

## 2016-11-01 DIAGNOSIS — E785 Hyperlipidemia, unspecified: Secondary | ICD-10-CM | POA: Diagnosis not present

## 2016-11-01 DIAGNOSIS — R634 Abnormal weight loss: Secondary | ICD-10-CM | POA: Diagnosis not present

## 2016-11-01 DIAGNOSIS — Z125 Encounter for screening for malignant neoplasm of prostate: Secondary | ICD-10-CM | POA: Diagnosis not present

## 2016-11-14 DIAGNOSIS — L03031 Cellulitis of right toe: Secondary | ICD-10-CM | POA: Diagnosis not present

## 2016-11-14 DIAGNOSIS — M79674 Pain in right toe(s): Secondary | ICD-10-CM | POA: Diagnosis not present

## 2016-11-28 ENCOUNTER — Telehealth: Payer: Self-pay | Admitting: Cardiovascular Disease

## 2016-11-28 DIAGNOSIS — L03031 Cellulitis of right toe: Secondary | ICD-10-CM | POA: Diagnosis not present

## 2016-11-28 DIAGNOSIS — M79674 Pain in right toe(s): Secondary | ICD-10-CM | POA: Diagnosis not present

## 2016-11-28 NOTE — Telephone Encounter (Signed)
Wife says pt needs to be seen. He is just not feeling good,sleeps all the time and no energy.

## 2016-11-29 ENCOUNTER — Other Ambulatory Visit: Payer: Self-pay

## 2016-11-29 NOTE — Telephone Encounter (Signed)
S/w wife she states that "all pt wants to do is sit around" and this is not normal for him he is always up doing something and she wants appt asap. Pt is feeling fatigued. Appt scheduled with APP 12-12-16

## 2016-12-02 ENCOUNTER — Encounter: Payer: Self-pay | Admitting: *Deleted

## 2016-12-04 ENCOUNTER — Other Ambulatory Visit: Payer: Self-pay | Admitting: Cardiovascular Disease

## 2016-12-05 NOTE — Telephone Encounter (Signed)
Rx(s) sent to pharmacy electronically.  

## 2016-12-10 DIAGNOSIS — H401131 Primary open-angle glaucoma, bilateral, mild stage: Secondary | ICD-10-CM | POA: Diagnosis not present

## 2016-12-10 DIAGNOSIS — Z961 Presence of intraocular lens: Secondary | ICD-10-CM | POA: Diagnosis not present

## 2016-12-10 DIAGNOSIS — H353132 Nonexudative age-related macular degeneration, bilateral, intermediate dry stage: Secondary | ICD-10-CM | POA: Diagnosis not present

## 2016-12-12 ENCOUNTER — Ambulatory Visit (INDEPENDENT_AMBULATORY_CARE_PROVIDER_SITE_OTHER): Payer: Medicare Other | Admitting: Physician Assistant

## 2016-12-12 VITALS — BP 118/64 | HR 66 | Ht 68.0 in | Wt 122.6 lb

## 2016-12-12 DIAGNOSIS — R634 Abnormal weight loss: Secondary | ICD-10-CM

## 2016-12-12 DIAGNOSIS — E039 Hypothyroidism, unspecified: Secondary | ICD-10-CM

## 2016-12-12 DIAGNOSIS — R079 Chest pain, unspecified: Secondary | ICD-10-CM | POA: Diagnosis not present

## 2016-12-12 DIAGNOSIS — E785 Hyperlipidemia, unspecified: Secondary | ICD-10-CM | POA: Diagnosis not present

## 2016-12-12 DIAGNOSIS — I10 Essential (primary) hypertension: Secondary | ICD-10-CM

## 2016-12-12 DIAGNOSIS — I2510 Atherosclerotic heart disease of native coronary artery without angina pectoris: Secondary | ICD-10-CM | POA: Diagnosis not present

## 2016-12-12 NOTE — Patient Instructions (Addendum)
Medication Instructions:   No changes  Labwork:   None today. We will contact Dr. Nolon Rod office to obtain recent labwork.  Testing/Procedures:  Your physician has requested that you have an echocardiogram. Echocardiography is a painless test that uses sound waves to create images of your heart. It provides your doctor with information about the size and shape of your heart and how well your heart's chambers and valves are working. This procedure takes approximately one hour. There are no restrictions for this procedure.   Follow-Up:  Your physician recommends that you schedule a follow-up appointment in: October with [name]  If you need a refill on your cardiac medications before your next appointment, please call your pharmacy.

## 2016-12-12 NOTE — Progress Notes (Signed)
Cardiology Office Note    Date:  12/13/2016   ID:  [name], [name] 04-May-1929, MRN [id number]  PCP:  Redmond School, MD  Cardiologist:  [name]  Chief Complaint  Patient presents with  . Follow-up    seen for [name]    History of Present Illness:  Dale [name] is a 83 y.o. male with PMH of CAD, HTN, HLD, hypothyroidism and first-degree AV block. He had initially intervention to his RCA in 2000. He received a Cypher stent placed in the acute marginal branch of his RCA in 2009. Last cardiac catheterization in 01/2012 by Dr. [name] showed 30% LAD after the second diagonal, normal left circumflex and patent RCA stent. He was diagnosed with left renal cell carcinoma in January 2018 confirmed by biopsy.  He presents today complaining of myriad of issues including weight loss, weakness and the eventually chest discomfort. He says he has been losing significant amount of weight without trying. Looking at his vital signs, he did lose roughly 17 pounds since December 2017. I asked him if anybody is following up on the renal cell carcinoma or used there is any further treatment planned. He is not aware of any. Last CT of abdomen obtained on 10/01/2016 suggested ablation of the 1.8 cm left renal mass. He is not aware of any other cancer. He says his primary care provider obtained a lot of labs to month ago, we will try to request records on those. Otherwise as far as his chest discomfort, he says he has 2 types of chest pain. One is a right-sided new chest pain that he has never experienced before, sometimes the last throughout the day. There is no obvious exacerbating or alleviating factors. He has a left-sided chest pain which is his usual angina. However he does not usually notice this unless he overexerted himself. He is a very poor historian and could not recall majority of the inflammation. I initially recommended outpatient stress test, however he does not want to pursue this at this time as  he had a very bad experience with the last stress test. I did eventually recommended outpatient echocardiogram to make sure his overall ejection fraction is stable. I think the right-sided chest pain is fairly atypical. Given his reassuring cardiac catheterization in 2013 which showed minimal disease, I think we can delay the stress test for now as long as his ejection fraction is normal. However I cannot explain his weakness or unexplained weight loss. I suggest him to discuss this with his primary care provider.   Past Medical History:  Diagnosis Date  . Allergic rhinitis   . Anal fissure   . Anginal pain ([place])   . Asthma   . Cancer ([place])    skin  . Chest pain   . CHF (congestive heart failure) ([place])   . Coronary heart disease    s/p stenting. cath in 01/2012 noncritical occlusion  . Dysrhythmia    1st degree heart block  . GERD (gastroesophageal reflux disease)   . Glaucoma   . Hiatal hernia   . Hyperlipidemia   . Hypertension   . Hypothyroidism   . Idiopathic thrombocytopenic purpura ([place]) 2002  . Macular degeneration   . Nephrolithiasis   . PUD (peptic ulcer disease)    remote  . Sarcoidosis    pulmonary  . Schatzki's ring     Past Surgical History:  Procedure Laterality Date  . cardiac stents    . COLONOSCOPY  10/30/2006  Normal rectum, sigmoid diverticula.Remainder of colonic mucosa appeared normal.  . CORONARY ANGIOPLASTY WITH STENT PLACEMENT     about 10 years ago per pt (around 2007)  . ESOPHAGOGASTRODUODENOSCOPY  06/19/2004   Two esophageal rings and esophageal web as described above.  All of these were disrupted by passing 56-French Venia Minks dilator/ Candida esophagitis,which appears to be incidental given history of   antibiotic use, but nevertheless will be treated.  . ESOPHAGOGASTRODUODENOSCOPY  10/30/2006   Distal tandem esophageal ring status post dilation disruption as  described above.  Otherwise normal esophagus/  Small hiatal hernia otherwise normal  stomach, D1 and D2  . ESOPHAGOGASTRODUODENOSCOPY N/A 03/22/2015   Dr.Rourk- noncritical schatzki's ring and hiatal hernia-o/w normal EGD.   Marland Kitchen ESOPHAGOGASTRODUODENOSCOPY (EGD) WITH ESOPHAGEAL DILATION  03/04/2012   RMR- schatzki's ring, hiatal hernia, polypoid gastric mucosa, bx= minimally active gastritis.  Everlean Alstrom GENERIC HISTORICAL  03/06/2016   IR RADIOLOGIST EVAL & MGMT 03/06/2016 Aletta Edouard, MD [facility] INTERV RAD  . IR GENERIC HISTORICAL  06/18/2016   IR RADIOLOGIST EVAL & MGMT 06/18/2016 Aletta Edouard, MD [facility] INTERV RAD  . IR RADIOLOGIST EVAL & MGMT  10/01/2016  . LEFT HEART CATH N/A 02/02/2012   Procedure: LEFT HEART CATH;  Surgeon: Lorretta Harp, MD;  Location: [facility] CATH LAB;  Service: Cardiovascular;  Laterality: N/A;  . MEDIASTINOSCOPY     for dx sarcoid  . RADIOLOGY WITH ANESTHESIA Left 05/17/2016   Procedure: left renal ablation;  Surgeon: Aletta Edouard, MD;  Location: [facility] ORS;  Service: Radiology;  Laterality: Left;    Current Medications: Outpatient Medications Prior to Visit  Medication Sig Dispense Refill  . acetaZOLAMIDE (DIAMOX) 250 MG tablet Take 250 mg by mouth 2 (two) times daily.    Marland Kitchen amLODipine (NORVASC) 5 MG tablet TAKE 1 TABLET BY MOUTH  DAILY 90 tablet 1  . aspirin 81 MG tablet Take 81 mg by mouth. Every other day    . atorvastatin (LIPITOR) 40 MG tablet TAKE 1 TABLET BY MOUTH  DAILY 90 tablet 2  . clopidogrel (PLAVIX) 75 MG tablet TAKE 1 TABLET BY MOUTH  DAILY 90 tablet 3  . dorzolamide (TRUSOPT) 2 % ophthalmic solution Place 1 drop into both eyes 2 (two) times daily.     . finasteride (PROSCAR) 5 MG tablet Take 5 mg by mouth daily.    Marland Kitchen guaiFENesin (MUCINEX) 600 MG 12 hr tablet Take 1,200 mg by mouth daily.     . hydrochlorothiazide (HYDRODIURIL) 12.5 MG tablet TAKE 1 TABLET BY MOUTH ONCE A DAY AS NEEDED FOR FLUID  RETENTION 90 tablet 2  . isosorbide mononitrate (IMDUR) 60 MG 24 hr tablet TAKE 1 TABLET BY MOUTH IN  THE MORNING AND 1/2 TABLET  AT NIGHT (Patient  taking differently: TAKE 60 MG EVERY MORNING AND 30 MG EVERY NIGHT) 135 tablet 3  . latanoprost (XALATAN) 0.005 % ophthalmic solution Place 1 drop into both eyes at bedtime.    Marland Kitchen levothyroxine (SYNTHROID, LEVOTHROID) 100 MCG tablet TAKE 1 TABLET BY MOUTH  DAILY (Patient taking differently: TAKE 100 MCG BY MOUTH EVERY MORNING) 90 tablet 3  . losartan (COZAAR) 100 MG tablet TAKE 1 TABLET BY MOUTH  DAILY 90 tablet 2  . metoprolol tartrate (LOPRESSOR) 25 MG tablet TAKE ONE-HALF TABLET BY  MOUTH TWICE A DAY (Patient taking differently: TAKE 12.5 MG BY MOUTH TWICE DAILY) 90 tablet 3  . mometasone (ELOCON) 0.1 % ointment Apply topically daily.    . Multiple Vitamin (MULTIVITAMIN) tablet Take 1 tablet  by mouth every evening.     . Multiple Vitamins-Minerals (PRESERVISION/LUTEIN) CAPS Take 1 capsule by mouth 2 (two) times daily.     . nitroGLYCERIN (NITROSTAT) 0.4 MG SL tablet Place 1 tablet (0.4 mg total) under the tongue every 5 (five) minutes as needed. For chest pain 25 tablet 3  . ondansetron (ZOFRAN ODT) 4 MG disintegrating tablet 45m ODT q4 hours prn nausea/vomit (Patient taking differently: Take 4 mg by mouth every 4 (four) hours as needed for nausea or vomiting. ) 4 tablet 0  . pantoprazole (PROTONIX) 40 MG tablet Take 1 tablet (40 mg total) by mouth 2 (two) times daily before a meal. (Patient taking differently: Take 40 mg by mouth 2 (two) times daily after a meal. ) 180 tablet 2  . potassium chloride (K-DUR,KLOR-CON) 10 MEQ tablet TAKE 1 TABLET BY MOUTH  DAILY 90 tablet 1  . timolol (TIMOPTIC) 0.5 % ophthalmic solution Place 1 drop into both eyes daily.     .Marland Kitchentriamcinolone (KENALOG) 0.1 % cream Apply 1 application topically 2 (two) times daily as needed (irritation).     . vitamin C (ASCORBIC ACID) 500 MG tablet Take 500 mg by mouth daily.     . vitamin E 200 UNIT capsule Take 200 Units by mouth every evening.     . NONFORMULARY OR COMPOUNDED ITEM Allergy Vaccine 1:50 Given at PCP office     No  facility-administered medications prior to visit.      Allergies:   Azithromycin; Doxazosin; Acetaminophen; Atenolol; Hydrocodone; Levofloxacin; Morphine; Penicillins; and Sulfonamide derivatives   Social History   Social History  . Marital status: Married    Spouse name: N/A  . Number of children: 1  . Years of education: N/A   Occupational History  . Retired     [employer]  .  Retired   Social History Main Topics  . Smoking status: Former Smoker    Packs/day: 0.10    Years: 2.00    Types: Cigarettes, Cigars    Quit date: 05/06/1970  . Smokeless tobacco: Never Used  . Alcohol use No  . Drug use: No  . Sexual activity: No   Other Topics Concern  . None   Social History Narrative  . None     Family History:  The patient's family history includes Alzheimer's disease in his mother; Cancer in his other; Heart attack in his brother; Heart disease in his father; Stroke in his mother.   ROS:   Please see the history of present illness.    ROS All other systems reviewed and are negative.   PHYSICAL EXAM:   VS:  BP 118/64   Pulse 66   Ht 5' 8"  (1.727 m)   Wt 122 lb 9.6 oz (55.6 kg)   SpO2 100%   BMI 18.64 kg/m    GEN: Well nourished, well developed, in no acute distress  HEENT: normal  Neck: no JVD, carotid bruits, or masses Cardiac: RRR; no murmurs, rubs, or gallops,no edema  Respiratory:  clear to auscultation bilaterally, normal work of breathing GI: soft, nontender, nondistended, + BS MS: no deformity or atrophy  Skin: warm and dry, no rash Neuro:  Alert and Oriented x 3, Strength and sensation are intact Psych: euthymic mood, full affect  Wt Readings from Last 3 Encounters:  12/12/16 122 lb 9.6 oz (55.6 kg)  10/01/16 124 lb (56.2 kg)  06/18/16 132 lb (59.9 kg)      Studies/Labs Reviewed:   EKG:  EKG is ordered today.  The ekg ordered today demonstrates Normal sinus rhythm without significant ST-T wave changes  Recent  Labs: 05/18/2016: Hemoglobin 10.9; Platelets 199; Potassium 2.8; Sodium 134 09/12/2016: BUN 21; Creatinine, Ser 0.69   Lipid Panel    Component Value Date/Time   CHOL 127 09/08/2015 0933   TRIG 89 09/08/2015 0933   HDL 48 09/08/2015 0933   CHOLHDL 2.2 07/07/2014 0954   CHOLHDL 2.6 08/15/2011 0555   VLDL 15 08/15/2011 0555   LDLCALC 61 09/08/2015 0933    Additional studies/ records that were reviewed today include:   Myoview 08/15/2011 IMPRESSION:  1.  No reversible ischemia or infarction. 2.  Normal wall motion. 3.  Left ventricular ejection fraction equal to greater than 70%.     ASSESSMENT:    1. Chest pain, unspecified type   2. Weight loss   3. Coronary artery disease involving native coronary artery of native heart without angina pectoris   4. Essential hypertension   5. Hyperlipidemia, unspecified hyperlipidemia type   6. Hypothyroidism, unspecified type      PLAN:  In order of problems listed above:  1. Chest pain: He has 2 types of chest pain. The left-sided chest pain occurs with exertion is his usual angina, however unless he overexerted himself, it does not occur. The right-sided chest pain occur more recently in the past few weeks, it is not associated with exertion.  2. Weight loss: Unexplained, he says he is not trying to lose weight, he did lose roughly 17 pounds in the past 8 months. I recommended him to discuss this with his primary care provider. He could not tell me if there were any plans after his recent renal cell carcinoma, this will need to be followed by the primary care provider.  3. Hypertension: Blood pressure stable  4. Hyperlipidemia: Continue on Lipitor  5. Hypothyroidism: Currently on Synthroid    Medication Adjustments/Labs and Tests Ordered: Current medicines are reviewed at length with the patient today.  Concerns regarding medicines are outlined above.  Medication changes, Labs and Tests ordered today are listed in the Patient  Instructions below. Patient Instructions  Medication Instructions:   No changes  Labwork:   None today. We will contact Dr. Nolon Rod office to obtain recent labwork.  Testing/Procedures:  Your physician has requested that you have an echocardiogram. Echocardiography is a painless test that uses sound waves to create images of your heart. It provides your doctor with information about the size and shape of your heart and how well your heart's chambers and valves are working. This procedure takes approximately one hour. There are no restrictions for this procedure.   Follow-Up:  Your physician recommends that you schedule a follow-up appointment in: October with [name]  If you need a refill on your cardiac medications before your next appointment, please call your pharmacy.      Hilbert Corrigan, [place]  12/13/2016 5:38 PM    [facility] [address], [place], [place]  [address] Phone: [phone]; Fax: [phone]

## 2016-12-13 ENCOUNTER — Encounter: Payer: Self-pay | Admitting: Physician Assistant

## 2016-12-16 NOTE — Addendum Note (Signed)
Addended by: Milderd Meager on: 12/16/2016 10:12 AM   Modules accepted: Orders

## 2016-12-17 ENCOUNTER — Telehealth: Payer: Self-pay | Admitting: Cardiovascular Disease

## 2016-12-17 DIAGNOSIS — L03031 Cellulitis of right toe: Secondary | ICD-10-CM | POA: Diagnosis not present

## 2016-12-17 MED ORDER — LOSARTAN POTASSIUM 100 MG PO TABS: 100.0000 mg | ORAL_TABLET | Freq: Every day | ORAL | 0 refills | Status: DC

## 2016-12-17 NOTE — Telephone Encounter (Signed)
Patient directly notified.  

## 2016-12-17 NOTE — Telephone Encounter (Signed)
New Message      *STAT* If patient is at the pharmacy, call can be transferred to refill team.   1. Which medications need to be refilled? (please list name of each medication and dose if known)  losartan  2. Which pharmacy/location (including street and city if local pharmacy) is medication to be sent to?  [facility] [address] [place]   3. Do they need a 30 day or 90 day supply?  Needs a week worth, mail order company forgot to mail it

## 2016-12-23 ENCOUNTER — Ambulatory Visit (HOSPITAL_COMMUNITY): Payer: Medicare Other | Attending: Cardiovascular Disease

## 2016-12-23 ENCOUNTER — Other Ambulatory Visit: Payer: Self-pay

## 2016-12-23 DIAGNOSIS — I440 Atrioventricular block, first degree: Secondary | ICD-10-CM | POA: Diagnosis not present

## 2016-12-23 DIAGNOSIS — R079 Chest pain, unspecified: Secondary | ICD-10-CM | POA: Insufficient documentation

## 2016-12-23 DIAGNOSIS — I2510 Atherosclerotic heart disease of native coronary artery without angina pectoris: Secondary | ICD-10-CM | POA: Diagnosis not present

## 2016-12-23 DIAGNOSIS — E039 Hypothyroidism, unspecified: Secondary | ICD-10-CM | POA: Insufficient documentation

## 2016-12-23 DIAGNOSIS — C649 Malignant neoplasm of unspecified kidney, except renal pelvis: Secondary | ICD-10-CM | POA: Diagnosis not present

## 2016-12-23 DIAGNOSIS — E785 Hyperlipidemia, unspecified: Secondary | ICD-10-CM | POA: Diagnosis not present

## 2017-01-01 ENCOUNTER — Other Ambulatory Visit: Payer: Self-pay

## 2017-01-01 ENCOUNTER — Emergency Department (HOSPITAL_COMMUNITY): Payer: Medicare Other

## 2017-01-01 ENCOUNTER — Encounter (HOSPITAL_COMMUNITY): Payer: Self-pay | Admitting: Neurology

## 2017-01-01 ENCOUNTER — Observation Stay (HOSPITAL_COMMUNITY)
Admission: EM | Admit: 2017-01-01 | Discharge: 2017-01-03 | Disposition: A | Discharge status: Home / Self Care | Payer: Medicare Other | Attending: Family Medicine | Admitting: Family Medicine

## 2017-01-01 DIAGNOSIS — K222 Esophageal obstruction: Secondary | ICD-10-CM | POA: Diagnosis not present

## 2017-01-01 DIAGNOSIS — I491 Atrial premature depolarization: Secondary | ICD-10-CM | POA: Insufficient documentation

## 2017-01-01 DIAGNOSIS — J329 Chronic sinusitis, unspecified: Secondary | ICD-10-CM | POA: Diagnosis present

## 2017-01-01 DIAGNOSIS — R05 Cough: Secondary | ICD-10-CM | POA: Insufficient documentation

## 2017-01-01 DIAGNOSIS — J4520 Mild intermittent asthma, uncomplicated: Secondary | ICD-10-CM | POA: Insufficient documentation

## 2017-01-01 DIAGNOSIS — E785 Hyperlipidemia, unspecified: Secondary | ICD-10-CM | POA: Diagnosis not present

## 2017-01-01 DIAGNOSIS — J019 Acute sinusitis, unspecified: Secondary | ICD-10-CM

## 2017-01-01 DIAGNOSIS — I25110 Atherosclerotic heart disease of native coronary artery with unstable angina pectoris: Principal | ICD-10-CM | POA: Insufficient documentation

## 2017-01-01 DIAGNOSIS — R7989 Other specified abnormal findings of blood chemistry: Secondary | ICD-10-CM | POA: Diagnosis not present

## 2017-01-01 DIAGNOSIS — H6121 Impacted cerumen, right ear: Secondary | ICD-10-CM | POA: Diagnosis not present

## 2017-01-01 DIAGNOSIS — Z882 Allergy status to sulfonamides status: Secondary | ICD-10-CM | POA: Insufficient documentation

## 2017-01-01 DIAGNOSIS — Z8711 Personal history of peptic ulcer disease: Secondary | ICD-10-CM | POA: Insufficient documentation

## 2017-01-01 DIAGNOSIS — D869 Sarcoidosis, unspecified: Secondary | ICD-10-CM | POA: Diagnosis not present

## 2017-01-01 DIAGNOSIS — I440 Atrioventricular block, first degree: Secondary | ICD-10-CM | POA: Diagnosis not present

## 2017-01-01 DIAGNOSIS — I110 Hypertensive heart disease with heart failure: Secondary | ICD-10-CM | POA: Insufficient documentation

## 2017-01-01 DIAGNOSIS — Z809 Family history of malignant neoplasm, unspecified: Secondary | ICD-10-CM | POA: Insufficient documentation

## 2017-01-01 DIAGNOSIS — Z820 Family history of epilepsy and other diseases of the nervous system: Secondary | ICD-10-CM | POA: Insufficient documentation

## 2017-01-01 DIAGNOSIS — Z880 Allergy status to penicillin: Secondary | ICD-10-CM | POA: Insufficient documentation

## 2017-01-01 DIAGNOSIS — E039 Hypothyroidism, unspecified: Secondary | ICD-10-CM | POA: Diagnosis not present

## 2017-01-01 DIAGNOSIS — R072 Precordial pain: Secondary | ICD-10-CM

## 2017-01-01 DIAGNOSIS — I2510 Atherosclerotic heart disease of native coronary artery without angina pectoris: Secondary | ICD-10-CM | POA: Diagnosis present

## 2017-01-01 DIAGNOSIS — Z823 Family history of stroke: Secondary | ICD-10-CM | POA: Insufficient documentation

## 2017-01-01 DIAGNOSIS — I10 Essential (primary) hypertension: Secondary | ICD-10-CM | POA: Diagnosis not present

## 2017-01-01 DIAGNOSIS — E7800 Pure hypercholesterolemia, unspecified: Secondary | ICD-10-CM | POA: Diagnosis not present

## 2017-01-01 DIAGNOSIS — Z7902 Long term (current) use of antithrombotics/antiplatelets: Secondary | ICD-10-CM | POA: Insufficient documentation

## 2017-01-01 DIAGNOSIS — R079 Chest pain, unspecified: Secondary | ICD-10-CM | POA: Diagnosis not present

## 2017-01-01 DIAGNOSIS — D473 Essential (hemorrhagic) thrombocythemia: Secondary | ICD-10-CM | POA: Insufficient documentation

## 2017-01-01 DIAGNOSIS — R061 Stridor: Secondary | ICD-10-CM | POA: Diagnosis not present

## 2017-01-01 DIAGNOSIS — I5032 Chronic diastolic (congestive) heart failure: Secondary | ICD-10-CM | POA: Insufficient documentation

## 2017-01-01 DIAGNOSIS — E876 Hypokalemia: Secondary | ICD-10-CM | POA: Diagnosis not present

## 2017-01-01 DIAGNOSIS — K279 Peptic ulcer, site unspecified, unspecified as acute or chronic, without hemorrhage or perforation: Secondary | ICD-10-CM | POA: Insufficient documentation

## 2017-01-01 DIAGNOSIS — Z87442 Personal history of urinary calculi: Secondary | ICD-10-CM | POA: Insufficient documentation

## 2017-01-01 DIAGNOSIS — I700 Atherosclerosis of aorta: Secondary | ICD-10-CM | POA: Insufficient documentation

## 2017-01-01 DIAGNOSIS — K449 Diaphragmatic hernia without obstruction or gangrene: Secondary | ICD-10-CM | POA: Insufficient documentation

## 2017-01-01 DIAGNOSIS — N400 Enlarged prostate without lower urinary tract symptoms: Secondary | ICD-10-CM | POA: Insufficient documentation

## 2017-01-01 DIAGNOSIS — H3530 Unspecified macular degeneration: Secondary | ICD-10-CM | POA: Insufficient documentation

## 2017-01-01 DIAGNOSIS — R634 Abnormal weight loss: Secondary | ICD-10-CM | POA: Insufficient documentation

## 2017-01-01 DIAGNOSIS — R0789 Other chest pain: Secondary | ICD-10-CM | POA: Diagnosis present

## 2017-01-01 DIAGNOSIS — D649 Anemia, unspecified: Secondary | ICD-10-CM | POA: Diagnosis not present

## 2017-01-01 DIAGNOSIS — K602 Anal fissure, unspecified: Secondary | ICD-10-CM | POA: Insufficient documentation

## 2017-01-01 DIAGNOSIS — C642 Malignant neoplasm of left kidney, except renal pelvis: Secondary | ICD-10-CM | POA: Diagnosis not present

## 2017-01-01 DIAGNOSIS — K219 Gastro-esophageal reflux disease without esophagitis: Secondary | ICD-10-CM | POA: Diagnosis not present

## 2017-01-01 DIAGNOSIS — Z955 Presence of coronary angioplasty implant and graft: Secondary | ICD-10-CM | POA: Insufficient documentation

## 2017-01-01 DIAGNOSIS — H409 Unspecified glaucoma: Secondary | ICD-10-CM | POA: Insufficient documentation

## 2017-01-01 DIAGNOSIS — Z681 Body mass index (BMI) 19 or less, adult: Secondary | ICD-10-CM | POA: Insufficient documentation

## 2017-01-01 DIAGNOSIS — Z885 Allergy status to narcotic agent status: Secondary | ICD-10-CM | POA: Insufficient documentation

## 2017-01-01 DIAGNOSIS — Z8249 Family history of ischemic heart disease and other diseases of the circulatory system: Secondary | ICD-10-CM | POA: Insufficient documentation

## 2017-01-01 DIAGNOSIS — Z85828 Personal history of other malignant neoplasm of skin: Secondary | ICD-10-CM | POA: Diagnosis not present

## 2017-01-01 DIAGNOSIS — Z87891 Personal history of nicotine dependence: Secondary | ICD-10-CM | POA: Insufficient documentation

## 2017-01-01 DIAGNOSIS — Z7982 Long term (current) use of aspirin: Secondary | ICD-10-CM | POA: Insufficient documentation

## 2017-01-01 LAB — BASIC METABOLIC PANEL
ANION GAP: 6 (ref 5–15)
BUN: 18 mg/dL (ref 6–20)
CALCIUM: 9.2 mg/dL (ref 8.9–10.3)
CO2: 21 mmol/L — AB (ref 22–32)
CREATININE: 0.81 mg/dL (ref 0.61–1.24)
Chloride: 111 mmol/L (ref 101–111)
Glucose, Bld: 109 mg/dL — ABNORMAL HIGH (ref 65–99)
Potassium: 3.4 mmol/L — ABNORMAL LOW (ref 3.5–5.1)
SODIUM: 138 mmol/L (ref 135–145)

## 2017-01-01 LAB — CBC
HCT: 34.6 % — ABNORMAL LOW (ref 39.0–52.0)
HEMOGLOBIN: 11.2 g/dL — AB (ref 13.0–17.0)
MCH: 33.6 pg (ref 26.0–34.0)
MCHC: 32.4 g/dL (ref 30.0–36.0)
MCV: 103.9 fL — ABNORMAL HIGH (ref 78.0–100.0)
PLATELETS: 169 10*3/uL (ref 150–400)
RBC: 3.33 MIL/uL — AB (ref 4.22–5.81)
RDW: 13.8 % (ref 11.5–15.5)
WBC: 5.8 10*3/uL (ref 4.0–10.5)

## 2017-01-01 LAB — I-STAT TROPONIN, ED: TROPONIN I, POC: 0 ng/mL (ref 0.00–0.08)

## 2017-01-01 LAB — D-DIMER, QUANTITATIVE (NOT AT ARMC): D DIMER QUANT: 2.1 ug{FEU}/mL — AB (ref 0.00–0.50)

## 2017-01-01 MED ORDER — PANTOPRAZOLE SODIUM 40 MG PO TBEC
40.0000 mg | DELAYED_RELEASE_TABLET | Freq: Two times a day (BID) | ORAL | Status: DC
  Administered 2017-01-02 – 2017-01-03 (×3): 40 mg via ORAL
  Filled 2017-01-01 (×3): qty 1

## 2017-01-01 MED ORDER — NITROGLYCERIN 0.4 MG SL SUBL: 0.4000 mg | SUBLINGUAL_TABLET | SUBLINGUAL | Status: DC | PRN

## 2017-01-01 MED ORDER — ADULT MULTIVITAMIN W/MINERALS CH
1.0000 | ORAL_TABLET | Freq: Every evening | ORAL | Status: DC
  Administered 2017-01-02 (×2): 1 via ORAL
  Filled 2017-01-01 (×2): qty 1

## 2017-01-01 MED ORDER — GI COCKTAIL ~~LOC~~: 30.0000 mL | Freq: Four times a day (QID) | ORAL | Status: DC | PRN

## 2017-01-01 MED ORDER — CLOPIDOGREL BISULFATE 75 MG PO TABS
75.0000 mg | ORAL_TABLET | Freq: Every day | ORAL | Status: DC
  Administered 2017-01-02 – 2017-01-03 (×2): 75 mg via ORAL
  Filled 2017-01-01 (×2): qty 1

## 2017-01-01 MED ORDER — TRIAMCINOLONE ACETONIDE 0.1 % EX CREA: 1.0000 "application " | TOPICAL_CREAM | Freq: Two times a day (BID) | CUTANEOUS | Status: DC | PRN

## 2017-01-01 MED ORDER — FINASTERIDE 5 MG PO TABS
5.0000 mg | ORAL_TABLET | Freq: Every day | ORAL | Status: DC
  Administered 2017-01-02 – 2017-01-03 (×2): 5 mg via ORAL
  Filled 2017-01-01 (×2): qty 1

## 2017-01-01 MED ORDER — ATORVASTATIN CALCIUM 40 MG PO TABS
40.0000 mg | ORAL_TABLET | Freq: Every day | ORAL | Status: DC
  Administered 2017-01-02 (×2): 40 mg via ORAL
  Filled 2017-01-01 (×2): qty 1

## 2017-01-01 MED ORDER — AMLODIPINE BESYLATE 5 MG PO TABS
5.0000 mg | ORAL_TABLET | Freq: Every day | ORAL | Status: DC
  Administered 2017-01-03: 5 mg via ORAL
  Filled 2017-01-01 (×2): qty 1

## 2017-01-01 MED ORDER — ISOSORBIDE MONONITRATE ER 60 MG PO TB24
60.0000 mg | ORAL_TABLET | Freq: Every day | ORAL | Status: DC
  Administered 2017-01-02 – 2017-01-03 (×2): 60 mg via ORAL
  Filled 2017-01-01 (×2): qty 1

## 2017-01-01 MED ORDER — POTASSIUM CHLORIDE CRYS ER 20 MEQ PO TBCR
20.0000 meq | EXTENDED_RELEASE_TABLET | ORAL | Status: AC
  Administered 2017-01-02: 20 meq via ORAL
  Filled 2017-01-01: qty 1

## 2017-01-01 MED ORDER — ACETAZOLAMIDE 250 MG PO TABS
250.0000 mg | ORAL_TABLET | Freq: Two times a day (BID) | ORAL | Status: DC
  Administered 2017-01-02 – 2017-01-03 (×3): 250 mg via ORAL
  Filled 2017-01-01 (×3): qty 1

## 2017-01-01 MED ORDER — ONDANSETRON HCL 4 MG/2ML IJ SOLN: 4.0000 mg | Freq: Four times a day (QID) | INTRAMUSCULAR | Status: DC | PRN

## 2017-01-01 MED ORDER — IOPAMIDOL (ISOVUE-370) INJECTION 76%
INTRAVENOUS | Status: AC
  Administered 2017-01-01: 100 mL via INTRAVENOUS
  Filled 2017-01-01: qty 100

## 2017-01-01 MED ORDER — ENOXAPARIN SODIUM 40 MG/0.4ML ~~LOC~~ SOLN
40.0000 mg | Freq: Every day | SUBCUTANEOUS | Status: DC
  Administered 2017-01-02 (×2): 40 mg via SUBCUTANEOUS
  Filled 2017-01-01 (×2): qty 0.4

## 2017-01-01 MED ORDER — ISOSORBIDE MONONITRATE ER 30 MG PO TB24
30.0000 mg | ORAL_TABLET | Freq: Every day | ORAL | Status: DC
  Administered 2017-01-02: 30 mg via ORAL
  Filled 2017-01-01 (×2): qty 1

## 2017-01-01 MED ORDER — SODIUM CHLORIDE 0.9 % IV BOLUS (SEPSIS)
500.0000 mL | Freq: Once | INTRAVENOUS | Status: AC
  Administered 2017-01-01: 500 mL via INTRAVENOUS

## 2017-01-01 MED ORDER — TIMOLOL MALEATE 0.5 % OP SOLN
1.0000 [drp] | Freq: Every day | OPHTHALMIC | Status: DC
  Administered 2017-01-02 – 2017-01-03 (×2): 1 [drp] via OPHTHALMIC
  Filled 2017-01-01: qty 5

## 2017-01-01 MED ORDER — LEVOTHYROXINE SODIUM 100 MCG PO TABS
100.0000 ug | ORAL_TABLET | Freq: Every evening | ORAL | Status: DC
  Administered 2017-01-02: 100 ug via ORAL
  Filled 2017-01-01 (×2): qty 1

## 2017-01-01 MED ORDER — ASPIRIN 81 MG PO CHEW
81.0000 mg | CHEWABLE_TABLET | Freq: Every day | ORAL | Status: DC
  Administered 2017-01-02 – 2017-01-03 (×2): 81 mg via ORAL
  Filled 2017-01-01 (×2): qty 1

## 2017-01-01 MED ORDER — LATANOPROST 0.005 % OP SOLN
1.0000 [drp] | Freq: Every day | OPHTHALMIC | Status: DC
  Administered 2017-01-02 (×2): 1 [drp] via OPHTHALMIC
  Filled 2017-01-01: qty 2.5

## 2017-01-01 MED ORDER — GUAIFENESIN ER 600 MG PO TB12
1200.0000 mg | ORAL_TABLET | Freq: Every day | ORAL | Status: DC
  Administered 2017-01-02 – 2017-01-03 (×2): 1200 mg via ORAL
  Filled 2017-01-01 (×2): qty 2

## 2017-01-01 MED ORDER — DORZOLAMIDE HCL 2 % OP SOLN
1.0000 [drp] | Freq: Two times a day (BID) | OPHTHALMIC | Status: DC
  Administered 2017-01-02 – 2017-01-03 (×4): 1 [drp] via OPHTHALMIC
  Filled 2017-01-01: qty 10

## 2017-01-01 MED ORDER — METOPROLOL TARTRATE 12.5 MG HALF TABLET
12.5000 mg | ORAL_TABLET | Freq: Two times a day (BID) | ORAL | Status: DC
Start: 2017-01-01 — End: 2017-01-03
  Administered 2017-01-02 – 2017-01-03 (×3): 12.5 mg via ORAL
  Filled 2017-01-01 (×4): qty 1

## 2017-01-01 MED ORDER — LOSARTAN POTASSIUM 50 MG PO TABS
100.0000 mg | ORAL_TABLET | Freq: Every day | ORAL | Status: DC
  Administered 2017-01-03: 100 mg via ORAL
  Filled 2017-01-01 (×2): qty 2

## 2017-01-01 NOTE — ED Provider Notes (Signed)
[name] DEPT Provider Note   CSN: [id number] Arrival date & time: 01/01/17  1425     History   Chief Complaint Chief Complaint  Patient presents with  . Chest Pain    HPI Dale [name] is a 83 y.o. male.  HPI  This elderly male presents with concern of chest pain. Currently he is chest pain-free, but earlier today, while in the yard, doing yardwork he had one episode of pressure across the anterior chest. He notes that over the past week or so he has had multiple similar episodes, both with exertion and at rest, inconsistent. Patient required nitroglycerin, or had some improvement after taking it at least once during the past 3 days. Episodes are typically the same, with anterior chest pressure, no substantial dyspnea, mild lightheadedness. No recent medication changes, diet changes, activity changes.      Past Medical History:  Diagnosis Date  . Allergic rhinitis   . Anal fissure   . Anginal pain ([place])   . Asthma   . Cancer ([place])    skin  . Chest pain   . CHF (congestive heart failure) ([place])   . Coronary heart disease    s/p stenting. cath in 01/2012 noncritical occlusion  . Dysrhythmia    1st degree heart block  . GERD (gastroesophageal reflux disease)   . Glaucoma   . Hiatal hernia   . Hyperlipidemia   . Hypertension   . Hypothyroidism   . Idiopathic thrombocytopenic purpura ([place]) 2002  . Macular degeneration   . Nephrolithiasis   . PUD (peptic ulcer disease)    remote  . Sarcoidosis    pulmonary  . Schatzki's ring     Patient Active Problem List   Diagnosis Date Noted  . Left renal mass 05/17/2016  . Hiatal hernia   . Schatzki's ring   . Odynophagia 03/03/2015  . Easy bruisability 09/26/2014  . Hyperlipidemia LDL goal <70 06/28/2014  . First degree AV block 10/08/2013  . Chest pain 02/02/2012  . CAD, RCA stent, RCA new DES July 2009, Cath [facility] 2011, 02/02/12 02/02/2012  . Unstable angina, cath showed patent stents 02/02/12 02/02/2012  .  ITP (idiopathic thrombocytopenic purpura) 2002 02/02/2012  . BPH (benign prostatic hyperplasia) 02/02/2012  . Chest pain 08/15/2011  . Macular degeneration 08/15/2011  . History of ITP 08/15/2011  . ABDOMINAL PAIN 06/28/2010  . DIARRHEA 06/21/2010  . GERD 01/19/2009  . HEMATOCHEZIA 01/19/2009  . DYSPHAGIA UNSPECIFIED 01/19/2009  . Hypothyroidism 01/18/2009  . HYPERCHOLESTEROLEMIA 01/18/2009  . GLAUCOMA 01/18/2009  . Essential hypertension 01/18/2009  . CHF 01/18/2009  . SCHATZKI'S RING 01/18/2009  . ANAL FISSURE 01/18/2009  . NEPHROLITHIASIS 01/18/2009  . PUD, HX OF 01/18/2009  . Sarcoidosis 08/27/2007  . Coronary atherosclerosis 08/27/2007  . Seasonal and perennial allergic rhinitis 08/27/2007  . Allergic asthma, mild intermittent, uncomplicated [id number]/7619    Past Surgical History:  Procedure Laterality Date  . cardiac stents    . COLONOSCOPY  10/30/2006   Normal rectum, sigmoid diverticula.Remainder of colonic mucosa appeared normal.  . CORONARY ANGIOPLASTY WITH STENT PLACEMENT     about 10 years ago per pt (around 2007)  . ESOPHAGOGASTRODUODENOSCOPY  06/19/2004   Two esophageal rings and esophageal web as described above.  All of these were disrupted by passing 56-French Venia Minks dilator/ Candida esophagitis,which appears to be incidental given history of   antibiotic use, but nevertheless will be treated.  . ESOPHAGOGASTRODUODENOSCOPY  10/30/2006   Distal tandem esophageal ring status post dilation disruption as  described above.  Otherwise normal esophagus/  Small hiatal hernia otherwise normal stomach, D1 and D2  . ESOPHAGOGASTRODUODENOSCOPY N/A 03/22/2015   Dr.Rourk- noncritical schatzki's ring and hiatal hernia-o/w normal EGD.   Marland Kitchen ESOPHAGOGASTRODUODENOSCOPY (EGD) WITH ESOPHAGEAL DILATION  03/04/2012   RMR- schatzki's ring, hiatal hernia, polypoid gastric mucosa, bx= minimally active gastritis.  Everlean Alstrom GENERIC HISTORICAL  03/06/2016   IR RADIOLOGIST EVAL & MGMT 03/06/2016  Aletta Edouard, MD [facility] INTERV RAD  . IR GENERIC HISTORICAL  06/18/2016   IR RADIOLOGIST EVAL & MGMT 06/18/2016 Aletta Edouard, MD [facility] INTERV RAD  . IR RADIOLOGIST EVAL & MGMT  10/01/2016  . LEFT HEART CATH N/A 02/02/2012   Procedure: LEFT HEART CATH;  Surgeon: Lorretta Harp, MD;  Location: [facility] CATH LAB;  Service: Cardiovascular;  Laterality: N/A;  . MEDIASTINOSCOPY     for dx sarcoid  . RADIOLOGY WITH ANESTHESIA Left 05/17/2016   Procedure: left renal ablation;  Surgeon: Aletta Edouard, MD;  Location: [facility] ORS;  Service: Radiology;  Laterality: Left;       Home Medications    Prior to Admission medications   Medication Sig Start Date End Date Taking? Authorizing Provider  acetaZOLAMIDE (DIAMOX) 250 MG tablet Take 250 mg by mouth 2 (two) times daily.   Yes [provider]  amLODipine (NORVASC) 5 MG tablet TAKE 1 TABLET BY MOUTH  DAILY 07/08/16  Yes Troy Sine, MD  aspirin 81 MG tablet Take 81 mg by mouth. Every other day   Yes [provider]  atorvastatin (LIPITOR) 40 MG tablet TAKE 1 TABLET BY MOUTH  DAILY Patient taking differently: TAKE 1 TABLET BY MOUTH  EVENING 06/17/16  Yes Troy Sine, MD  clopidogrel (PLAVIX) 75 MG tablet TAKE 1 TABLET BY MOUTH  DAILY 03/27/16  Yes Troy Sine, MD  dorzolamide (TRUSOPT) 2 % ophthalmic solution Place 1 drop into both eyes 2 (two) times daily.    Yes [provider]  finasteride (PROSCAR) 5 MG tablet Take 5 mg by mouth daily. AT NOON   Yes [provider]  guaiFENesin (MUCINEX) 600 MG 12 hr tablet Take 1,200 mg by mouth daily.    Yes [provider]  hydrochlorothiazide (HYDRODIURIL) 12.5 MG tablet TAKE 1 TABLET BY MOUTH ONCE A DAY AS NEEDED FOR FLUID  RETENTION 06/17/16  Yes Troy Sine, MD  isosorbide mononitrate (IMDUR) 60 MG 24 hr tablet TAKE 1 TABLET BY MOUTH IN  THE MORNING AND 1/2 TABLET  AT NIGHT Patient taking differently: TAKE 60 MG EVERY MORNING AND 30 MG EVERY NIGHT 04/30/16   Yes Troy Sine, MD  latanoprost (XALATAN) 0.005 % ophthalmic solution Place 1 drop into both eyes at bedtime.   Yes [provider]  levothyroxine (SYNTHROID, LEVOTHROID) 100 MCG tablet TAKE 1 TABLET BY MOUTH  DAILY Patient taking differently: TAKE 100 MCG BY MOUTH EVERY EVENING 04/30/16  Yes Troy Sine, MD  losartan (COZAAR) 100 MG tablet Take 1 tablet (100 mg total) by mouth daily. 12/17/16  Yes Troy Sine, MD  metoprolol tartrate (LOPRESSOR) 25 MG tablet TAKE ONE-HALF TABLET BY  MOUTH TWICE A DAY Patient taking differently: TAKE 12.5MG  BY MOUTH TWICE DAILY 04/30/16  Yes Troy Sine, MD  Multiple Vitamin (MULTIVITAMIN) tablet Take 1 tablet by mouth every evening.    Yes [provider]  Multiple Vitamins-Minerals (PRESERVISION/LUTEIN) CAPS Take 1 capsule by mouth 2 (two) times daily.    Yes [provider]  nitroGLYCERIN (NITROSTAT) 0.4 MG  SL tablet Place 1 tablet (0.4 mg total) under the tongue every 5 (five) minutes as needed. For chest pain 03/14/16  Yes Troy Sine, MD  ondansetron (ZOFRAN ODT) 4 MG disintegrating tablet 4mg  ODT q4 hours prn nausea/vomit Patient taking differently: Take 4 mg by mouth every 4 (four) hours as needed for nausea or vomiting.  05/11/13  Yes Elnora Morrison, MD  pantoprazole (PROTONIX) 40 MG tablet Take 1 tablet (40 mg total) by mouth 2 (two) times daily before a meal. Patient taking differently: Take 40 mg by mouth 2 (two) times daily after a meal.  08/31/15  Yes Carlis Stable, NP  potassium chloride (K-DUR,KLOR-CON) 10 MEQ tablet TAKE 1 TABLET BY MOUTH  DAILY 12/05/16  Yes Troy Sine, MD  timolol (TIMOPTIC) 0.5 % ophthalmic solution Place 1 drop into both eyes daily.    Yes [provider]  triamcinolone (KENALOG) 0.1 % cream Apply 1 application topically 2 (two) times daily as needed (irritation).    Yes [provider]  vitamin C (ASCORBIC ACID) 500 MG tablet Take 500 mg by mouth daily.    Yes  [provider]  vitamin E 200 UNIT capsule Take 200 Units by mouth daily. AT NOON   Yes [provider]  mometasone (ELOCON) 0.1 % ointment Apply topically daily.    [provider]    Family History Family History  Problem Relation Age of Onset  . Heart disease Father        deceased age 38  . Stroke Mother   . Alzheimer's disease Mother   . Heart attack Brother        deceased age 31  . Cancer Other        niece  . Colon cancer Neg Hx     Social History Social History  Substance Use Topics  . Smoking status: Former Smoker    Packs/day: 0.10    Years: 2.00    Types: Cigarettes, Cigars    Quit date: 05/06/1970  . Smokeless tobacco: Never Used  . Alcohol use No     Allergies   Azithromycin; Doxazosin; Acetaminophen; Atenolol; Hydrocodone; Levofloxacin; Morphine; Penicillins; and Sulfonamide derivatives   Review of Systems Review of Systems  Constitutional: Positive for unexpected weight change.       20 lbs. Weight loss this year.  HENT:       Per HPI, otherwise negative  Respiratory:       Per HPI, otherwise negative  Cardiovascular:       Per HPI, otherwise negative  Gastrointestinal: Negative for vomiting.  Endocrine:       Negative aside from HPI  Genitourinary:       Neg aside from HPI   Musculoskeletal:       Per HPI, otherwise negative  Skin: Negative.   Neurological: Negative for syncope.     Physical Exam Updated Vital Signs BP 137/77   Pulse (!) 57   Temp 98.7 F (37.1 C) (Oral)   Resp 14   Ht 5\' 8"  (1.727 m)   Wt 56.2 kg (124 lb)   SpO2 100%   BMI 18.85 kg/m   Physical Exam  Constitutional: He is oriented to person, place, and time. He appears well-developed. No distress.  HENT:  Head: Normocephalic and atraumatic.  Eyes: Conjunctivae and EOM are normal.  Cardiovascular: Normal rate and regular rhythm.   Pulmonary/Chest: Effort normal. No stridor. No respiratory distress.  Abdominal: He exhibits no  distension.  Musculoskeletal: He exhibits  no edema.  Neurological: He is alert and oriented to person, place, and time. He displays atrophy. He displays no tremor. He exhibits normal muscle tone. He displays no seizure activity. Coordination normal.  Skin: Skin is warm and dry.  Psychiatric: He has a normal mood and affect.  Nursing note and vitals reviewed.    ED Treatments / Results  Labs (all labs ordered are listed, but only abnormal results are displayed) Labs Reviewed  BASIC METABOLIC PANEL - Abnormal; Notable for the following:       Result Value   Potassium 3.4 (*)    CO2 21 (*)    Glucose, Bld 109 (*)    All other components within normal limits  CBC - Abnormal; Notable for the following:    RBC 3.33 (*)    Hemoglobin 11.2 (*)    HCT 34.6 (*)    MCV 103.9 (*)    All other components within normal limits  D-DIMER, QUANTITATIVE (NOT AT [facility]) - Abnormal; Notable for the following:    D-Dimer, Quant 2.10 (*)    All other components within normal limits  I-STAT TROPONIN, ED    EKG  EKG Interpretation  Date/Time:  Wednesday January 01 2017 14:39:58 EDT Ventricular Rate:  56 PR Interval:  222 QRS Duration: 104 QT Interval:  410 QTC Calculation: 395 R Axis:   77 Text Interpretation:  Sinus rhythm Sinus arrhythmia Premature atrial complexes with 1st degree A-V block Artifact Abnormal ekg Confirmed by Carmin Muskrat [phone]) on 01/01/2017 4:52:57 PM       Radiology Dg Chest 2 View  Result Date: 01/01/2017 CLINICAL DATA:  Initial evaluation for acute chest pain. EXAM: CHEST  2 VIEW COMPARISON:  Prior radiograph from 05/10/2016. FINDINGS: The cardiac and mediastinal silhouettes are stable in size and contour, and remain within normal limits. Aortic atherosclerosis. The lungs are normally inflated. Chronic scarring within the bilateral perihilar regions, similar to previous. No airspace consolidation, pleural effusion, or pulmonary edema is identified. There is no  pneumothorax. No acute osseous abnormality identified. IMPRESSION: 1. No active cardiopulmonary disease. 2. Stable chronic parenchymal scarring. Electronically Signed   By: Jeannine Boga M.D.   On: 01/01/2017 15:09   Ct Angio Chest Pe W/cm &/or Wo Cm  Result Date: 01/01/2017 CLINICAL DATA:  Recurrent chest pain positive D-dimer EXAM: CT ANGIOGRAPHY CHEST WITH CONTRAST TECHNIQUE: Multidetector CT imaging of the chest was performed using the standard protocol during bolus administration of intravenous contrast. Multiplanar CT image reconstructions and MIPs were obtained to evaluate the vascular anatomy. CONTRAST:  100 cc Isovue 370 intravenous COMPARISON:  Radiograph 01/01/2017, CT chest 06/30/2013, 10/01/2011 FINDINGS: Cardiovascular: Satisfactory opacification of the pulmonary arteries to the segmental level. No evidence of pulmonary embolism. Non aneurysmal aorta. Atherosclerotic calcifications. Coronary artery calcifications. No large pericardial effusion. Mediastinum/Nodes: Midline trachea. No thyroid mass. Calcified hilar and mediastinal lymph nodes. Esophagus within normal limits. Periesophageal calcified nodes as well. Lungs/Pleura: Stable parenchymal scarring within the hilar regions bilaterally. Stable left upper lobe subpleural pulmonary nodule. Similar appearance of mild fibrosis in the right upper lobe with scattered nodules. Negative for pneumothorax, pleural effusion or acute consolidation Upper Abdomen: Cyst in the left lobe of the liver. Calcified granuloma in the liver. Nonobstructing stone mid to upper pole left kidney Musculoskeletal: No chest wall abnormality. No acute or significant osseous findings. Review of the MIP images confirms the above findings. IMPRESSION: 1. Negative for acute pulmonary embolus 2. Stable perihilar scarring and fibrosis in the right upper lobe with scattered  pulmonary nodules. Calcified mediastinal lymph nodes as before 3. Negative for acute infiltrate,  pneumothorax or pleural effusion 4. Nonobstructing stone in the left kidney Aortic Atherosclerosis (ICD10-I70.0). Electronically Signed   By: Donavan Foil M.D.   On: 01/01/2017 20:11    Procedures Procedures (including critical care time)  Medications Ordered in ED Medications  sodium chloride 0.9 % bolus 500 mL (0 mLs Intravenous Stopped 01/01/17 1710)  iopamidol (ISOVUE-370) 76 % injection (100 mLs Intravenous Contrast Given 01/01/17 1908)     Initial Impression / Assessment and Plan / ED Course  I have reviewed the triage vital signs and the nursing notes.  Pertinent labs & imaging results that were available during my care of the patient were reviewed by me and considered in my medical decision making (see chart for details).  After the initial evaluation, with concern forongoing coronary ischemia, pulmonary embolism, pneumonia, broad differential, the patient had multiple lab studies performed, x-ray ordered.   Update:, Initial studies generally reassuring, though with a positive d-dimer, and patient's history of substantial weight loss, or some suspicion for occult malignancy. CT angiography pending.  8:49 PM Patient is currently chest pain-free, and now accompanied by his son, who corroborates the patient has developed pain over the past few days, possibly weak, with anterior chest pressure, episodic dyspnea. We reviewed the patient's evaluation thus far including an initial troponin I was reassuring, CT angiography to exclude PE. With episodic pain, pressure, and advanced age as well as risk factors, the patient will be admitted for further evaluation and management. Final Clinical Impressions(s) / ED Diagnoses  Atypical chest pain   Carmin Muskrat, MD 01/01/17 2049

## 2017-01-01 NOTE — ED Triage Notes (Signed)
Per ems- pt comes from home, he was out in the yard working, developed CP, which is recurrent. Went inside, called EMS to bring him to evaluate him. AFIB, HR 70. CP free. BP 120/62. 18 R. Hand. Is a x 4.

## 2017-01-01 NOTE — H&P (Signed)
History and Physical    Dale [name] DOB: [birth year] DOA: 01/01/2017  Referring MD/NP/PA: Dr. Vanita Panda PCP: Redmond School, MD  Patient coming from: Home via EMS  Chief Complaint: Chest pain  HPI: Dale [name] is a 83 y.o. male with medical history significant of HTN, HLD, CAD s/p stent, first-degree AV block, RCC, skin cancer, diastolic CHF last EF 81-19% on 12/23/2016 complaints of ; who presents with intermittent chest pain for the last month. Patient reports having crampy /heaviness feeling across his chest. Symptoms can occur when he's had rest or with activity.  He also expresses a left-sided chest pain that is different that usually occurs with exertion. He has taken nitroglycerin before with relief of symptoms, sometimes symptoms go away on their own, and then other times lying down will resolve symptoms. Associated symptoms include fatigue, weight loss approximately 20 pounds in last 8 months, right ear discomfort, postnasal drainage, nonproductive cough, and nausea. Denies having any significant diaphoresis, or headedness, orloss consciousness. Patient is a poor historian and had been previously diagnosed with renal cell carcinoma unclear treatments given previously.   He is followed by Dr. Shelva Majestic of cardiology in outpatient setting. He last followed up with the PA regarding his symptoms on 8/9. At that time the left-sided chest pain was thought to be likely related to angina and it was unclear what was causing his right-sided chest pain symptoms..   ED Course: Upon admission into the emergency department patient was seen to be afebrile, pulse 57, all other vital signs relatively within normal limits. Labs revealed WBC 5.8, hemoglobin 11.2, MCV 103.9 , potassium 3.4, d-dimer 2.1, and troponin negative. EKG did not show any significant ischemic changes. Due to the elevated d-dimer a CT angiogram was obtained which was negative for any acute signs of pulmonary embolus.  TRH called to admit.  Review of Systems: Review of Systems  Constitutional: Positive for weight loss. Negative for chills and fever.  HENT: Positive for congestion, ear pain and hearing loss.   Eyes: Negative for photophobia and pain.  Respiratory: Positive for cough and shortness of breath. Negative for sputum production and wheezing.   Cardiovascular: Positive for chest pain. Negative for leg swelling.  Gastrointestinal: Positive for nausea. Negative for abdominal pain and vomiting.  Genitourinary: Negative for dysuria and frequency.  Musculoskeletal: Positive for joint pain and neck pain. Negative for falls.  Neurological: Positive for weakness. Negative for focal weakness and loss of consciousness.  Endo/Heme/Allergies: Positive for environmental allergies. Bruises/bleeds easily.  Psychiatric/Behavioral: Negative for substance abuse and suicidal ideas.    Past Medical History:  Diagnosis Date  . Allergic rhinitis   . Anal fissure   . Anginal pain ([place])   . Asthma   . Cancer ([place])    skin  . Chest pain   . CHF (congestive heart failure) ([place])   . Coronary heart disease    s/p stenting. cath in 01/2012 noncritical occlusion  . Dysrhythmia    1st degree heart block  . GERD (gastroesophageal reflux disease)   . Glaucoma   . Hiatal hernia   . Hyperlipidemia   . Hypertension   . Hypothyroidism   . Idiopathic thrombocytopenic purpura ([place]) 2002  . Macular degeneration   . Nephrolithiasis   . PUD (peptic ulcer disease)    remote  . Sarcoidosis    pulmonary  . Schatzki's ring     Past Surgical History:  Procedure Laterality Date  . cardiac stents    . COLONOSCOPY  10/30/2006   Normal rectum, sigmoid diverticula.Remainder of colonic mucosa appeared normal.  . CORONARY ANGIOPLASTY WITH STENT PLACEMENT     about 10 years ago per pt (around 2007)  . ESOPHAGOGASTRODUODENOSCOPY  06/19/2004   Two esophageal rings and esophageal web as described above.  All of these were  disrupted by passing 56-French Venia Minks dilator/ Candida esophagitis,which appears to be incidental given history of   antibiotic use, but nevertheless will be treated.  . ESOPHAGOGASTRODUODENOSCOPY  10/30/2006   Distal tandem esophageal ring status post dilation disruption as  described above.  Otherwise normal esophagus/  Small hiatal hernia otherwise normal stomach, D1 and D2  . ESOPHAGOGASTRODUODENOSCOPY N/A 03/22/2015   Dr.Rourk- noncritical schatzki's ring and hiatal hernia-o/w normal EGD.   Marland Kitchen ESOPHAGOGASTRODUODENOSCOPY (EGD) WITH ESOPHAGEAL DILATION  03/04/2012   RMR- schatzki's ring, hiatal hernia, polypoid gastric mucosa, bx= minimally active gastritis.  Everlean Alstrom GENERIC HISTORICAL  03/06/2016   IR RADIOLOGIST EVAL & MGMT 03/06/2016 Aletta Edouard, MD [facility] INTERV RAD  . IR GENERIC HISTORICAL  06/18/2016   IR RADIOLOGIST EVAL & MGMT 06/18/2016 Aletta Edouard, MD [facility] INTERV RAD  . IR RADIOLOGIST EVAL & MGMT  10/01/2016  . LEFT HEART CATH N/A 02/02/2012   Procedure: LEFT HEART CATH;  Surgeon: Lorretta Harp, MD;  Location[facility] C[facility] LAB;  Service: Cardiovascular;  Laterality: N/A;  . MEDIASTINOSCOPY     for dx sarcoid  . RADIOLOGY WITH ANESTHESIA Left 05/17/2016   Procedure: left renal ablation;  Surgeon: Aletta Edouard, MD;  Location: [facility] ORS;  Service: Radiology;  Laterality: Left;     reports that he quit smoking about 46 years ago. His smoking use included Cigarettes and Cigars. He has a 0.20 pack-year smoking history. He has never used smokeless tobacco. He reports that he does not drink alcohol or use drugs.  Allergies  Allergen Reactions  . Azithromycin Other (See Comments)    Sore mouth and fever blisters around mouth, sores in nose area as well  . Doxazosin Shortness Of Breath  . Acetaminophen     REACTION: UNKNOWN REACTION  . Atenolol     REACTION: UNKNOWN REACTION  . Hydrocodone Nausea And Vomiting  . Levofloxacin Other (See Comments)    Caused stomach problems.  . Morphine      "made me crazy"  . Penicillins Nausea And Vomiting    Has patient had a PCN reaction causing immediate rash, facial/tongue/throat swelling, SOB or lightheadedness with hypotension: No Has patient had a PCN reaction causing severe rash involving mucus membranes or skin necrosis: No Has patient had a PCN reaction that required hospitalization No Has patient had a PCN reaction occurring within the last 10 years: No If all of the above answers are "NO", then may proceed with Cephalosporin use.   . Sulfonamide Derivatives Nausea And Vomiting    Family History  Problem Relation Age of Onset  . Heart disease Father        deceased age 40  . Stroke Mother   . Alzheimer's disease Mother   . Heart attack Brother        deceased age 20  . Cancer Other        niece  . Colon cancer Neg Hx     Prior to Admission medications   Medication Sig Start Date End Date Taking? Authorizing Provider  acetaZOLAMIDE (DIAMOX) 250 MG tablet Take 250 mg by mouth 2 (two) times daily.   Yes [provider]  amLODipine (NORVASC) 5 MG tablet TAKE 1  TABLET BY MOUTH  DAILY 07/08/16  Yes Troy Sine, MD  aspirin 81 MG tablet Take 81 mg by mouth. Every other day   Yes [provider]  atorvastatin (LIPITOR) 40 MG tablet TAKE 1 TABLET BY MOUTH  DAILY Patient taking differently: TAKE 1 TABLET BY MOUTH  EVENING 06/17/16  Yes Troy Sine, MD  clopidogrel (PLAVIX) 75 MG tablet TAKE 1 TABLET BY MOUTH  DAILY 03/27/16  Yes Troy Sine, MD  dorzolamide (TRUSOPT) 2 % ophthalmic solution Place 1 drop into both eyes 2 (two) times daily.    Yes [provider]  finasteride (PROSCAR) 5 MG tablet Take 5 mg by mouth daily. AT NOON   Yes [provider]  guaiFENesin (MUCINEX) 600 MG 12 hr tablet Take 1,200 mg by mouth daily.    Yes [provider]  hydrochlorothiazide (HYDRODIURIL) 12.5 MG tablet TAKE 1 TABLET BY MOUTH ONCE A DAY AS NEEDED FOR FLUID  RETENTION 06/17/16  Yes  Troy Sine, MD  isosorbide mononitrate (IMDUR) 60 MG 24 hr tablet TAKE 1 TABLET BY MOUTH IN  THE MORNING AND 1/2 TABLET  AT NIGHT Patient taking differently: TAKE 60 MG EVERY MORNING AND 30 MG EVERY NIGHT 04/30/16  Yes Troy Sine, MD  latanoprost (XALATAN) 0.005 % ophthalmic solution Place 1 drop into both eyes at bedtime.   Yes [provider]  levothyroxine (SYNTHROID, LEVOTHROID) 100 MCG tablet TAKE 1 TABLET BY MOUTH  DAILY Patient taking differently: TAKE 100 MCG BY MOUTH EVERY EVENING 04/30/16  Yes Troy Sine, MD  losartan (COZAAR) 100 MG tablet Take 1 tablet (100 mg total) by mouth daily. 12/17/16  Yes Troy Sine, MD  metoprolol tartrate (LOPRESSOR) 25 MG tablet TAKE ONE-HALF TABLET BY  MOUTH TWICE A DAY Patient taking differently: TAKE 12.5MG BY MOUTH TWICE DAILY 04/30/16  Yes Troy Sine, MD  Multiple Vitamin (MULTIVITAMIN) tablet Take 1 tablet by mouth every evening.    Yes [provider]  Multiple Vitamins-Minerals (PRESERVISION/LUTEIN) CAPS Take 1 capsule by mouth 2 (two) times daily.    Yes [provider]  nitroGLYCERIN (NITROSTAT) 0.4 MG SL tablet Place 1 tablet (0.4 mg total) under the tongue every 5 (five) minutes as needed. For chest pain 03/14/16  Yes Troy Sine, MD  ondansetron (ZOFRAN ODT) 4 MG disintegrating tablet 66m ODT q4 hours prn nausea/vomit Patient taking differently: Take 4 mg by mouth every 4 (four) hours as needed for nausea or vomiting.  05/11/13  Yes ZElnora Morrison MD  pantoprazole (PROTONIX) 40 MG tablet Take 1 tablet (40 mg total) by mouth 2 (two) times daily before a meal. Patient taking differently: Take 40 mg by mouth 2 (two) times daily after a meal.  08/31/15  Yes GCarlis Stable NP  potassium chloride (K-DUR,KLOR-CON) 10 MEQ tablet TAKE 1 TABLET BY MOUTH  DAILY 12/05/16  Yes KTroy Sine MD  timolol (TIMOPTIC) 0.5 % ophthalmic solution Place 1 drop into both eyes daily.    Yes [provider]    triamcinolone (KENALOG) 0.1 % cream Apply 1 application topically 2 (two) times daily as needed (irritation).    Yes [provider]  vitamin C (ASCORBIC ACID) 500 MG tablet Take 500 mg by mouth daily.    Yes [provider]  vitamin E 200 UNIT capsule Take 200 Units by mouth daily. AT NOON   Yes [provider]  mometasone (ELOCON) 0.1 % ointment Apply topically daily.  [provider]    Physical Exam:  Constitutional: Cachectic appearing elderly male in NAD, calm, comfortable Vitals:   01/01/17 1439 01/01/17 1441 01/01/17 1700  BP: 115/63  137/77  Pulse: (!) 57    Resp: 14    Temp: 98.7 F (37.1 C)    TempSrc: Oral    SpO2: 100%    Weight:  56.2 kg (124 lb)   Height:  '5\' 8"'  (1.727 m)    Eyes: PERRL, lids and conjunctivae normal ENMT: Mucous membranes are moist. Posterior oropharynx is erythematous with positive postnasal drainage. Nasal turbinates are erythematous. Erythematous left ear canal with purulent appearing fluid noted behind the tympanic membrane. Cerumen impaction present on right ear canal.  Neck: normal, supple, no masses, no thyromegaly Respiratory: clear to auscultation bilaterally, no wheezing, no crackles. Normal respiratory effort. No accessory muscle use.  Cardiovascular:  Bradycardic, no  rubs / gallops. No extremity edema. 2+ pedal pulses. No carotid bruits.  Abdomen: no tenderness, no masses palpated. No hepatosplenomegaly. Bowel sounds positive.  Musculoskeletal: no clubbing / cyanosis. No joint deformity upper and lower extremities. Good ROM, no contractures. Muscle atrophy present.  Skin: no rashes, lesions, ulcers. No induration Neurologic: CN 2-12 grossly intact. Sensation intact, DTR normal. Strength 5/5 in all 4.  Psychiatric: Normal judgment and insight. Alert and oriented x 3. Normal mood.     Labs on Admission: I have personally reviewed following labs and imaging studies  CBC:  Recent Labs Lab  01/01/17 1455  WBC 5.8  HGB 11.2*  HCT 34.6*  MCV 103.9*  PLT 071   Basic Metabolic Panel:  Recent Labs Lab 01/01/17 1455  NA 138  K 3.4*  CL 111  CO2 21*  GLUCOSE 109*  BUN 18  CREATININE 0.81  CALCIUM 9.2   GFR: Estimated Creatinine Clearance: 52 mL/min (by C-G formula based on SCr of 0.81 mg/dL). Liver Function Tests: No results for input(s): AST, ALT, ALKPHOS, BILITOT, PROT, ALBUMIN in the last 168 hours. No results for input(s): LIPASE, AMYLASE in the last 168 hours. No results for input(s): AMMONIA in the last 168 hours. Coagulation Profile: No results for input(s): INR, PROTIME in the last 168 hours. Cardiac Enzymes: No results for input(s): CKTOTAL, CKMB, CKMBINDEX, TROPONINI in the last 168 hours. BNP (last 3 results) No results for input(s): PROBNP in the last 8760 hours. HbA1C: No results for input(s): HGBA1C in the last 72 hours. CBG: No results for input(s): GLUCAP in the last 168 hours. Lipid Profile: No results for input(s): CHOL, HDL, LDLCALC, TRIG, CHOLHDL, LDLDIRECT in the last 72 hours. Thyroid Function Tests: No results for input(s): TSH, T4TOTAL, FREET4, T3FREE, THYROIDAB in the last 72 hours. Anemia Panel: No results for input(s): VITAMINB12, FOLATE, FERRITIN, TIBC, IRON, RETICCTPCT in the last 72 hours. Urine analysis:    Component Value Date/Time   COLORURINE YELLOW 02/02/2012 0630   APPEARANCEUR CLOUDY (A) 02/02/2012 0630   LABSPEC 1.022 02/02/2012 0630   PHURINE 7.5 02/02/2012 0630   GLUCOSEU NEGATIVE 02/02/2012 0630   HGBUR NEGATIVE 02/02/2012 0630   BILIRUBINUR NEGATIVE 02/02/2012 0630   KETONESUR NEGATIVE 02/02/2012 0630   PROTEINUR NEGATIVE 02/02/2012 0630   UROBILINOGEN 1.0 02/02/2012 0630   NITRITE NEGATIVE 02/02/2012 0630   LEUKOCYTESUR NEGATIVE 02/02/2012 0630   Sepsis Labs: No results found for this or any previous visit (from the past 240 hour(s)).   Radiological Exams on Admission: Dg Chest 2 View  Result Date:  01/01/2017 CLINICAL DATA:  Initial evaluation for acute chest pain.  EXAM: CHEST  2 VIEW COMPARISON:  Prior radiograph from 05/10/2016. FINDINGS: The cardiac and mediastinal silhouettes are stable in size and contour, and remain within normal limits. Aortic atherosclerosis. The lungs are normally inflated. Chronic scarring within the bilateral perihilar regions, similar to previous. No airspace consolidation, pleural effusion, or pulmonary edema is identified. There is no pneumothorax. No acute osseous abnormality identified. IMPRESSION: 1. No active cardiopulmonary disease. 2. Stable chronic parenchymal scarring. Electronically Signed   By: Jeannine Boga M.D.   On: 01/01/2017 15:09   Ct Angio Chest Pe W/cm &/or Wo Cm  Result Date: 01/01/2017 CLINICAL DATA:  Recurrent chest pain positive D-dimer EXAM: CT ANGIOGRAPHY CHEST WITH CONTRAST TECHNIQUE: Multidetector CT imaging of the chest was performed using the standard protocol during bolus administration of intravenous contrast. Multiplanar CT image reconstructions and MIPs were obtained to evaluate the vascular anatomy. CONTRAST:  100 cc Isovue 370 intravenous COMPARISON:  Radiograph 01/01/2017, CT chest 06/30/2013, 10/01/2011 FINDINGS: Cardiovascular: Satisfactory opacification of the pulmonary arteries to the segmental level. No evidence of pulmonary embolism. Non aneurysmal aorta. Atherosclerotic calcifications. Coronary artery calcifications. No large pericardial effusion. Mediastinum/Nodes: Midline trachea. No thyroid mass. Calcified hilar and mediastinal lymph nodes. Esophagus within normal limits. Periesophageal calcified nodes as well. Lungs/Pleura: Stable parenchymal scarring within the hilar regions bilaterally. Stable left upper lobe subpleural pulmonary nodule. Similar appearance of mild fibrosis in the right upper lobe with scattered nodules. Negative for pneumothorax, pleural effusion or acute consolidation Upper Abdomen: Cyst in the left lobe  of the liver. Calcified granuloma in the liver. Nonobstructing stone mid to upper pole left kidney Musculoskeletal: No chest wall abnormality. No acute or significant osseous findings. Review of the MIP images confirms the above findings. IMPRESSION: 1. Negative for acute pulmonary embolus 2. Stable perihilar scarring and fibrosis in the right upper lobe with scattered pulmonary nodules. Calcified mediastinal lymph nodes as before 3. Negative for acute infiltrate, pneumothorax or pleural effusion 4. Nonobstructing stone in the left kidney Aortic Atherosclerosis (ICD10-I70.0). Electronically Signed   By: Donavan Foil M.D.   On: 01/01/2017 20:11    EKG: Independently reviewed. Sinus arrhythmia  Assessment/Plan Chest pain: Acute. Heart score = 6. Patient with recent echocardiogram on 12/23/2016. Patient followed by [name] that outpatient setting.Patient reports 2 separate chest pains including a left-sided and one which is more centralized. - Admit to telemetry bed  - Chest pain order set initiated - Nitroglycerin prn chest pain - Trend cardiac enzymes - Check lipid panel - Consult to cardiology will be needed in a.m., message sent to Verna Czech  Acute sinusitis: Patient reports sinus congestion and postnasal drip. Physical exam shows erythematous nasal turbinates with left ear canal with fluid behind left eardrum. -  Cefuroxime   Essential hypertension - Held metoprolol for possible need of stress test - Continue losartan, amlodipine  Coronary artery disease - Continue Plavix and isosorbide mononitrate  Anemia - Check B12 and folate  Cerumen impaction of right ear - Orders placed for removal of cerumen of right ear  Hypothyroidism - Check TSH - Continue levothyroxine  Hypokalemia: Acute. Initial potassium 3.4 on admission.  - Give 20 meq of  potassium chloride 1 dose now - Continue to monitor and replace as needed   Weight loss with previous history of renal cell carcinoma  and skin cancer: Patient reports approximately  20 pounds over the last 8 months. Last CT scan of the abdomen was obtained on 01/2016 which showed progression of the left renal cell carcinoma. May  also want a question about issues with dysphagia. - Check ESR - May warrant further investigation/need to follow-up with his PCP/dermatologist given previous history of cancers  Hyperlipidemia - Continue atorvastatin  Jerrye Bushy, H/O PUD - continue Protonix  DVT prophylaxis: lovenox   Code Status: Full  Family Communication: No family members present  Disposition Plan:  discharge home tomorrow if workup negative  Consults called: card  Admission status:  observation  Norval Morton MD [facility] Pager [phone]   If 7PM-7AM, please contact night-coverage [email or web address] Password [facility]  01/01/2017, 10:29 PM

## 2017-01-02 ENCOUNTER — Other Ambulatory Visit (HOSPITAL_COMMUNITY): Payer: Medicare Other

## 2017-01-02 ENCOUNTER — Other Ambulatory Visit: Payer: Self-pay

## 2017-01-02 DIAGNOSIS — J329 Chronic sinusitis, unspecified: Secondary | ICD-10-CM | POA: Diagnosis present

## 2017-01-02 DIAGNOSIS — E785 Hyperlipidemia, unspecified: Secondary | ICD-10-CM | POA: Diagnosis not present

## 2017-01-02 DIAGNOSIS — H6121 Impacted cerumen, right ear: Secondary | ICD-10-CM | POA: Diagnosis not present

## 2017-01-02 DIAGNOSIS — J019 Acute sinusitis, unspecified: Secondary | ICD-10-CM | POA: Diagnosis not present

## 2017-01-02 DIAGNOSIS — E876 Hypokalemia: Secondary | ICD-10-CM | POA: Diagnosis not present

## 2017-01-02 DIAGNOSIS — I10 Essential (primary) hypertension: Secondary | ICD-10-CM | POA: Diagnosis not present

## 2017-01-02 DIAGNOSIS — D649 Anemia, unspecified: Secondary | ICD-10-CM | POA: Diagnosis not present

## 2017-01-02 DIAGNOSIS — R072 Precordial pain: Secondary | ICD-10-CM | POA: Diagnosis not present

## 2017-01-02 DIAGNOSIS — I2510 Atherosclerotic heart disease of native coronary artery without angina pectoris: Secondary | ICD-10-CM | POA: Diagnosis not present

## 2017-01-02 DIAGNOSIS — I25110 Atherosclerotic heart disease of native coronary artery with unstable angina pectoris: Secondary | ICD-10-CM | POA: Diagnosis not present

## 2017-01-02 DIAGNOSIS — Z681 Body mass index (BMI) 19 or less, adult: Secondary | ICD-10-CM | POA: Diagnosis not present

## 2017-01-02 DIAGNOSIS — K219 Gastro-esophageal reflux disease without esophagitis: Secondary | ICD-10-CM | POA: Diagnosis not present

## 2017-01-02 DIAGNOSIS — E039 Hypothyroidism, unspecified: Secondary | ICD-10-CM | POA: Diagnosis not present

## 2017-01-02 DIAGNOSIS — I110 Hypertensive heart disease with heart failure: Secondary | ICD-10-CM | POA: Diagnosis not present

## 2017-01-02 DIAGNOSIS — C642 Malignant neoplasm of left kidney, except renal pelvis: Secondary | ICD-10-CM | POA: Diagnosis not present

## 2017-01-02 DIAGNOSIS — Z955 Presence of coronary angioplasty implant and graft: Secondary | ICD-10-CM | POA: Diagnosis not present

## 2017-01-02 DIAGNOSIS — I5032 Chronic diastolic (congestive) heart failure: Secondary | ICD-10-CM | POA: Diagnosis not present

## 2017-01-02 LAB — LIPID PANEL
CHOLESTEROL: 98 mg/dL (ref 0–200)
HDL: 37 mg/dL — ABNORMAL LOW (ref >40)
LDL Cholesterol: 51 mg/dL (ref 0–99)
Total CHOL/HDL Ratio: 2.6 RATIO
Triglycerides: 48 mg/dL (ref <150)
VLDL: 10 mg/dL (ref 0–40)

## 2017-01-02 LAB — BASIC METABOLIC PANEL
Anion gap: 6 (ref 5–15)
BUN: 15 mg/dL (ref 6–20)
CHLORIDE: 110 mmol/L (ref 101–111)
CO2: 23 mmol/L (ref 22–32)
CREATININE: 0.71 mg/dL (ref 0.61–1.24)
Calcium: 8.7 mg/dL — ABNORMAL LOW (ref 8.9–10.3)
GFR calc non Af Amer: >60 mL/min (ref >60)
Glucose, Bld: 112 mg/dL — ABNORMAL HIGH (ref 65–99)
POTASSIUM: 3.3 mmol/L — AB (ref 3.5–5.1)
SODIUM: 139 mmol/L (ref 135–145)

## 2017-01-02 LAB — VITAMIN B12: VITAMIN B 12: 797 pg/mL (ref 180–914)

## 2017-01-02 LAB — CBC WITH DIFFERENTIAL/PLATELET
BASOS PCT: 1 %
Basophils Absolute: 0 10*3/uL (ref 0.0–0.1)
Eosinophils Absolute: 0.3 10*3/uL (ref 0.0–0.7)
Eosinophils Relative: 5 %
HEMATOCRIT: 33.3 % — AB (ref 39.0–52.0)
HEMOGLOBIN: 10.8 g/dL — AB (ref 13.0–17.0)
LYMPHS ABS: 1 10*3/uL (ref 0.7–4.0)
Lymphocytes Relative: 15 %
MCH: 33.3 pg (ref 26.0–34.0)
MCHC: 32.4 g/dL (ref 30.0–36.0)
MCV: 102.8 fL — ABNORMAL HIGH (ref 78.0–100.0)
MONOS PCT: 14 %
Monocytes Absolute: 0.9 10*3/uL (ref 0.1–1.0)
NEUTROS ABS: 4.1 10*3/uL (ref 1.7–7.7)
NEUTROS PCT: 65 %
Platelets: 182 10*3/uL (ref 150–400)
RBC: 3.24 MIL/uL — AB (ref 4.22–5.81)
RDW: 13.4 % (ref 11.5–15.5)
WBC: 6.2 10*3/uL (ref 4.0–10.5)

## 2017-01-02 LAB — TROPONIN I: Troponin I: <0.03 ng/mL (ref <0.03)

## 2017-01-02 LAB — TSH: TSH: 1.304 u[IU]/mL (ref 0.350–4.500)

## 2017-01-02 LAB — SEDIMENTATION RATE: Sed Rate: 2 mm/hr (ref 0–16)

## 2017-01-02 MED ORDER — ENSURE ENLIVE PO LIQD: 237.0000 mL | Freq: Two times a day (BID) | ORAL | Status: DC

## 2017-01-02 MED ORDER — CEFUROXIME AXETIL 500 MG PO TABS
500.0000 mg | ORAL_TABLET | Freq: Two times a day (BID) | ORAL | Status: DC
  Administered 2017-01-02 – 2017-01-03 (×3): 500 mg via ORAL
  Filled 2017-01-02 (×4): qty 1

## 2017-01-02 MED ORDER — POTASSIUM CHLORIDE CRYS ER 20 MEQ PO TBCR
40.0000 meq | EXTENDED_RELEASE_TABLET | Freq: Once | ORAL | Status: AC
  Administered 2017-01-02: 40 meq via ORAL
  Filled 2017-01-02: qty 2

## 2017-01-02 NOTE — Progress Notes (Signed)
PROGRESS NOTE    Dale [name]  BPZ:[id number]  DOB: 1929-01-11  DOA: 01/01/2017 PCP: Redmond School, MD   Brief Admission Hx: HPI: Dale [name] is a 83 y.o. male with medical history significant of HTN, HLD, CAD s/p stent, first-degree AV block, RCC, skin cancer, diastolic CHF last EF 24-23% on 12/23/2016 complaints of ; who presents with intermittent chest pain for the last month.   MDM/Assessment & Plan:   Chest pain: Acute. Heart score = 6. Patient with recent echocardiogram on 12/23/2016. Patient followed by [name] that outpatient setting.Patient reports 2 separate chest pains including a left-sided and one which is more centralized. - Cardiology planning for nuclear stress test in AM  Acute sinusitis: Patient reports sinus congestion and postnasal drip. Physical exam shows erythematous nasal turbinates with left ear canal with fluid behind left eardrum. -  Continue Cefuroxime   Essential hypertension - Held metoprolol for possible need of stress test - Continue losartan, amlodipine  Coronary artery disease - Continue Plavix and isosorbide mononitrate  Anemia - Check B12 and folate  Cerumen impaction of right ear - ear lavage ordered  Hypothyroidism - TSH WNL  - Continue levothyroxine  Hypokalemia: Acute. Initial potassium 3.4 on admission.  - Give 20 meq of  potassium chloride 1 dose now - Continue to monitor and replace as needed  - check BMP in AM  Weight loss with previous history of renal cell carcinoma and skin cancer: Patient reports approximately  20 pounds over the last 8 months. Last CT scan of the abdomen was obtained on 01/2016 which showed progression of the left renal cell carcinoma. May also want a question about issues with dysphagia. - ESR normal (2) - follow up with PCP and oncologist  Hyperlipidemia - Continue atorvastatin  Jerrye Bushy, H/O PUD - continue Protonix  DVT prophylaxis: lovenox   Code Status: Full  Family Communication:  No family members present   Subjective: Dale [name] reports that pain is much better now.    Objective: Vitals:   01/01/17 2320 01/02/17 0025 01/02/17 0437 01/02/17 1023  BP: 135/71 (!) 160/69 (!) 111/56 (!) 119/59  Pulse: (!) 57 60 60 60  Resp: '16 18 18   ' Temp:  (!) 97.5 F (36.4 C) 97.8 F (36.6 C)   TempSrc:  Oral Oral   SpO2: 98% 100% 100% 100%  Weight:  53.3 kg (117 lb 9.6 oz)    Height:  '5\' 8"'  (1.727 m)      Intake/Output Summary (Last 24 hours) at 01/02/17 1511 Last data filed at 01/02/17 0800  Gross per 24 hour  Intake              740 ml  Output             1176 ml  Net             -436 ml   Filed Weights   01/01/17 1441 01/02/17 0025  Weight: 56.2 kg (124 lb) 53.3 kg (117 lb 9.6 oz)     REVIEW OF SYSTEMS  As per history otherwise all reviewed and reported negative  Exam:  General exam: thin male, awake, alert, NAD, cooperative, oriented x 3.  Respiratory system: Clear. No increased work of breathing. Cardiovascular system: S1 & S2 heard, RRR. No JVD, murmurs, gallops, clicks or pedal edema. Gastrointestinal system: Abdomen is nondistended, soft and nontender. Normal bowel sounds heard. Central nervous system: Alert and oriented. No focal neurological deficits. Extremities: no CCE.  Data Reviewed: Basic Metabolic  Panel:  Recent Labs Lab 01/01/17 1455 01/02/17 0612  NA 138 139  K 3.4* 3.3*  CL 111 110  CO2 21* 23  GLUCOSE 109* 112*  BUN 18 15  CREATININE 0.81 0.71  CALCIUM 9.2 8.7*   Liver Function Tests: No results for input(s): AST, ALT, ALKPHOS, BILITOT, PROT, ALBUMIN in the last 168 hours. No results for input(s): LIPASE, AMYLASE in the last 168 hours. No results for input(s): AMMONIA in the last 168 hours. CBC:  Recent Labs Lab 01/01/17 1455 01/02/17 0612  WBC 5.8 6.2  NEUTROABS  --  4.1  HGB 11.2* 10.8*  HCT 34.6* 33.3*  MCV 103.9* 102.8*  PLT 169 182   Cardiac Enzymes:  Recent Labs Lab 01/02/17 0004 01/02/17 0612 01/02/17 1155   TROPONINI <0.03 <0.03 <0.03   CBG (last 3)  No results for input(s): GLUCAP in the last 72 hours. No results found for this or any previous visit (from the past 240 hour(s)).   Studies: Dg Chest 2 View  Result Date: 01/01/2017 CLINICAL DATA:  Initial evaluation for acute chest pain. EXAM: CHEST  2 VIEW COMPARISON:  Prior radiograph from 05/10/2016. FINDINGS: The cardiac and mediastinal silhouettes are stable in size and contour, and remain within normal limits. Aortic atherosclerosis. The lungs are normally inflated. Chronic scarring within the bilateral perihilar regions, similar to previous. No airspace consolidation, pleural effusion, or pulmonary edema is identified. There is no pneumothorax. No acute osseous abnormality identified. IMPRESSION: 1. No active cardiopulmonary disease. 2. Stable chronic parenchymal scarring. Electronically Signed   By: Jeannine Boga M.D.   On: 01/01/2017 15:09   Ct Angio Chest Pe W/cm &/or Wo Cm  Result Date: 01/01/2017 CLINICAL DATA:  Recurrent chest pain positive D-dimer EXAM: CT ANGIOGRAPHY CHEST WITH CONTRAST TECHNIQUE: Multidetector CT imaging of the chest was performed using the standard protocol during bolus administration of intravenous contrast. Multiplanar CT image reconstructions and MIPs were obtained to evaluate the vascular anatomy. CONTRAST:  100 cc Isovue 370 intravenous COMPARISON:  Radiograph 01/01/2017, CT chest 06/30/2013, 10/01/2011 FINDINGS: Cardiovascular: Satisfactory opacification of the pulmonary arteries to the segmental level. No evidence of pulmonary embolism. Non aneurysmal aorta. Atherosclerotic calcifications. Coronary artery calcifications. No large pericardial effusion. Mediastinum/Nodes: Midline trachea. No thyroid mass. Calcified hilar and mediastinal lymph nodes. Esophagus within normal limits. Periesophageal calcified nodes as well. Lungs/Pleura: Stable parenchymal scarring within the hilar regions bilaterally. Stable left  upper lobe subpleural pulmonary nodule. Similar appearance of mild fibrosis in the right upper lobe with scattered nodules. Negative for pneumothorax, pleural effusion or acute consolidation Upper Abdomen: Cyst in the left lobe of the liver. Calcified granuloma in the liver. Nonobstructing stone mid to upper pole left kidney Musculoskeletal: No chest wall abnormality. No acute or significant osseous findings. Review of the MIP images confirms the above findings. IMPRESSION: 1. Negative for acute pulmonary embolus 2. Stable perihilar scarring and fibrosis in the right upper lobe with scattered pulmonary nodules. Calcified mediastinal lymph nodes as before 3. Negative for acute infiltrate, pneumothorax or pleural effusion 4. Nonobstructing stone in the left kidney Aortic Atherosclerosis (ICD10-I70.0). Electronically Signed   By: Donavan Foil M.D.   On: 01/01/2017 20:11     Scheduled Meds: . acetaZOLAMIDE  250 mg Oral BID  . amLODipine  5 mg Oral Daily  . aspirin  81 mg Oral Daily  . atorvastatin  40 mg Oral q1800  . cefUROXime  500 mg Oral BID WC  . clopidogrel  75 mg Oral Daily  . dorzolamide  1 drop Both Eyes BID  . enoxaparin (LOVENOX) injection  40 mg Subcutaneous QHS  . finasteride  5 mg Oral Q1200  . guaiFENesin  1,200 mg Oral Daily  . isosorbide mononitrate  30 mg Oral QHS  . isosorbide mononitrate  60 mg Oral Daily  . latanoprost  1 drop Both Eyes QHS  . levothyroxine  100 mcg Oral QPM  . losartan  100 mg Oral Daily  . metoprolol tartrate  12.5 mg Oral BID  . multivitamin with minerals  1 tablet Oral QPM  . pantoprazole  40 mg Oral BID PC  . timolol  1 drop Both Eyes Daily   Continuous Infusions:  Principal Problem:   Chest pain Active Problems:   Hypothyroidism   Essential hypertension   GERD   CAD, RCA stent, RCA new DES July 2009, Cath [place] 2011, 02/02/12   Sinusitis   Time spent:   Irwin Brakeman, MD, FAAFP [facility] Pager [phone]  If 7PM-7AM, please  contact night-coverage [email or web address] Password [email or web address] 01/02/2017, 3:11 PM    LOS: 0 days

## 2017-01-02 NOTE — Progress Notes (Signed)
Patient without complaint during 7 a to 7 p shift, VSS.  Patient anxious to get back home to his wife who is in "worse shape than he is".  Patient is aware that he is nothing by mouth after midnight for stress test 01/03/17.  Son in room for part of shift.

## 2017-01-02 NOTE — Consult Note (Signed)
Cardiology Consultation:   Patient ID: ZYSHAWN BOHNENKAMP; [id number]; May 14, 1928   Admit date: 01/01/2017 Date of Consult: 01/02/2017  Primary Care Provider: Redmond School, MD Primary Cardiologist: [name] Primary Electrophysiologist:  None   Patient Profile:   Dale [name] is a 83 y.o. male with a hx of CAD, HTN, HLD, hypothyroidism and first-degree AV block. He had initial intervention to his RCA in 2000. He received a Cypher stent to his acute marginal branch of his RCA in 2009. Last cath was in 01/2012 by Dr. [name] showing 30% LAD after the second diagonal, nml L Circ and patent RCA stent. He was diagnosed with renal cell carcinoma in January 2018 confirmed on biopsy who is being seen today for the evaluation of chest pain at the request of Dr. Wynetta Emery.  History of Present Illness:   Dale [name] last saw [name], PA-C in the office 12/12/16 for a myriad of issues. He was having chest pains, 10-20 lb weight loss,  Weakness. At this visit the patient was having two kinds of chest pains, left sided chest pain that occurs with exertion and is his usual angina. Then he also was having a right-sided CP that had been occurring more recently the past few weeks not associated with exertion. Isaac Laud recommend a stress test for the patient but he declined because he has had a bad experience in the past. The patient was agreeable to a echocardiogram (12/23/16): EF normal.  He presented to the Emergency Department by EMS from home yesterday for chest pains that he had been feeling over the past month. The pain is a heavy/crampy feeling across his chest. It will start with either rest of activity. He has another kind of chest pain that is a different kind of pain that happens with exertion. Sometimes to be relieved with rest, laying down, nitroglycerin, or it goes away on its own. He continues to have fatigue and now is down ~20 lbs in the past 8 months. He complaints of right ear pain, postnasal drainage,  coughing and nausea.  The status of his renal cell carcinoma is unclear as the patient is a poor historian and is unsure of his course.  ER COURSE: CT angio chest was negative for any acute signs of PE. It did show atherosclerotic calcifications and coronary artery calcifications. EKG shows sinus bradycardiac with 1st degree AV-block. Pulse 57, WBC 5.8, Hgb 11.2, MVC 103.9, K 3.4, d-dimer 2.1, negative troponin. [facility] was called for admission.    Past Medical History:  Diagnosis Date  . Allergic rhinitis   . Anal fissure   . Anginal pain ([place])   . Asthma   . Cancer ([place])    skin  . Chest pain   . CHF (congestive heart failure) ([place])   . Coronary heart disease    s/p stenting. cath in 01/2012 noncritical occlusion  . Dysrhythmia    1st degree heart block  . GERD (gastroesophageal reflux disease)   . Glaucoma   . Hiatal hernia   . Hyperlipidemia   . Hypertension   . Hypothyroidism   . Idiopathic thrombocytopenic purpura ([place]) 2002  . Macular degeneration   . Nephrolithiasis   . PUD (peptic ulcer disease)    remote  . Sarcoidosis    pulmonary  . Schatzki's ring     Past Surgical History:  Procedure Laterality Date  . cardiac stents    . COLONOSCOPY  10/30/2006   Normal rectum, sigmoid diverticula.Remainder of colonic mucosa appeared normal.  .  CORONARY ANGIOPLASTY WITH STENT PLACEMENT     about 10 years ago per pt (around 2007)  . ESOPHAGOGASTRODUODENOSCOPY  06/19/2004   Two esophageal rings and esophageal web as described above.  All of these were disrupted by passing 56-French Venia Minks dilator/ Candida esophagitis,which appears to be incidental given history of   antibiotic use, but nevertheless will be treated.  . ESOPHAGOGASTRODUODENOSCOPY  10/30/2006   Distal tandem esophageal ring status post dilation disruption as  described above.  Otherwise normal esophagus/  Small hiatal hernia otherwise normal stomach, D1 and D2  . ESOPHAGOGASTRODUODENOSCOPY N/A 03/22/2015    Dr.Rourk- noncritical schatzki's ring and hiatal hernia-o/w normal EGD.   Marland Kitchen ESOPHAGOGASTRODUODENOSCOPY (EGD) WITH ESOPHAGEAL DILATION  03/04/2012   RMR- schatzki's ring, hiatal hernia, polypoid gastric mucosa, bx= minimally active gastritis.  Everlean Alstrom GENERIC HISTORICAL  03/06/2016   IR RADIOLOGIST EVAL & MGMT 03/06/2016 Aletta Edouard, MD [facility] INTERV RAD  . IR GENERIC HISTORICAL  06/18/2016   IR RADIOLOGIST EVAL & MGMT 06/18/2016 Aletta Edouard, MD [facility] INTERV RAD  . IR RADIOLOGIST EVAL & MGMT  10/01/2016  . LEFT HEART CATH N/A 02/02/2012   Procedure: LEFT HEART CATH;  Surgeon: Lorretta Harp, MD;  Location: [facility] CATH LAB;  Service: Cardiovascular;  Laterality: N/A;  . MEDIASTINOSCOPY     for dx sarcoid  . RADIOLOGY WITH ANESTHESIA Left 05/17/2016   Procedure: left renal ablation;  Surgeon: Aletta Edouard, MD;  Location: [facility] ORS;  Service: Radiology;  Laterality: Left;     Inpatient Medications: Scheduled Meds: . acetaZOLAMIDE  250 mg Oral BID  . amLODipine  5 mg Oral Daily  . aspirin  81 mg Oral Daily  . atorvastatin  40 mg Oral q1800  . cefUROXime  500 mg Oral BID WC  . clopidogrel  75 mg Oral Daily  . dorzolamide  1 drop Both Eyes BID  . enoxaparin (LOVENOX) injection  40 mg Subcutaneous QHS  . finasteride  5 mg Oral Q1200  . guaiFENesin  1,200 mg Oral Daily  . isosorbide mononitrate  30 mg Oral QHS  . isosorbide mononitrate  60 mg Oral Daily  . latanoprost  1 drop Both Eyes QHS  . levothyroxine  100 mcg Oral QPM  . losartan  100 mg Oral Daily  . metoprolol tartrate  12.5 mg Oral BID  . multivitamin with minerals  1 tablet Oral QPM  . pantoprazole  40 mg Oral BID PC  . potassium chloride  40 mEq Oral Once  . timolol  1 drop Both Eyes Daily   Continuous Infusions:  PRN Meds: gi cocktail, nitroGLYCERIN, ondansetron (ZOFRAN) IV, triamcinolone cream  Allergies:    Allergies  Allergen Reactions  . Azithromycin Other (See Comments)    Sore mouth and fever blisters around mouth,  sores in nose area as well  . Doxazosin Shortness Of Breath  . Acetaminophen     REACTION: UNKNOWN REACTION  . Atenolol     REACTION: UNKNOWN REACTION  . Hydrocodone Nausea And Vomiting  . Levofloxacin Other (See Comments)    Caused stomach problems.  . Morphine     "made me crazy"  . Penicillins Nausea And Vomiting    Has patient had a PCN reaction causing immediate rash, facial/tongue/throat swelling, SOB or lightheadedness with hypotension: No Has patient had a PCN reaction causing severe rash involving mucus membranes or skin necrosis: No Has patient had a PCN reaction that required hospitalization No Has patient had a PCN reaction occurring within the last 10 years:  No If all of the above answers are "NO", then may proceed with Cephalosporin use.   . Sulfonamide Derivatives Nausea And Vomiting    Social History:   Social History   Social History  . Marital status: Married    Spouse name: N/A  . Number of children: 1  . Years of education: N/A   Occupational History  . Retired     Visual merchandiser pumping station  .  Retired   Social History Main Topics  . Smoking status: Former Smoker    Packs/day: 0.10    Years: 2.00    Types: Cigarettes, Cigars    Quit date: 05/06/1970  . Smokeless tobacco: Never Used  . Alcohol use No  . Drug use: No  . Sexual activity: No   Other Topics Concern  . Not on file   Social History Narrative  . No narrative on file    Family History:   Family History  Problem Relation Age of Onset  . Heart disease Father        deceased age 49  . Stroke Mother   . Alzheimer's disease Mother   . Heart attack Brother        deceased age 36  . Cancer Other        niece  . Colon cancer Neg Hx      ROS:  Please see the history of present illness.  ROS  Weight loss; All other ROS reviewed and negative.     Physical Exam/Data:   Vitals:   01/01/17 2313 01/01/17 2320 01/02/17 0025 01/02/17 0437  BP: 135/71 135/71 (!) 160/69 (!) 111/56    Pulse:  (!) 57 60 60  Resp: '18 16 18 18  ' Temp:   (!) 97.5 F (36.4 C) 97.8 F (36.6 C)  TempSrc:   Oral Oral  SpO2: 98% 98% 100% 100%  Weight:   117 lb 9.6 oz (53.3 kg)   Height:   '5\' 8"'  (1.727 m)     Intake/Output Summary (Last 24 hours) at 01/02/17 0948 Last data filed at 01/02/17 0300  Gross per 24 hour  Intake              740 ml  Output              925 ml  Net             -185 ml   Filed Weights   01/01/17 1441 01/02/17 0025  Weight: 124 lb (56.2 kg) 117 lb 9.6 oz (53.3 kg)   Body mass index is 17.88 kg/m.  General:  Well nourished, frail, in no acute distress HEENT: normal Lymph: no adenopathy Neck: no JVD Endocrine:  No thryomegaly Vascular: No carotid bruits; FA pulses 2+ bilaterally without bruits  Cardiac:  normal S1, S2; RRR; no murmur  Lungs:  clear to auscultation bilaterally, no wheezing, rhonchi or rales  Abd: soft, nontender, no hepatomegaly  Ext: no edema Musculoskeletal:  No deformities, BUE and BLE strength normal and equal Skin: warm and dry; multiple ecchymoses Neuro:  CNs 2-12 intact, no focal abnormalities noted Psych:  Normal affect   EKG:  The EKG was personally reviewed and demonstrates:  First degree AV-block, with sinus rhythm rate 57. Telemetry:  Telemetry was personally reviewed and demonstrates:  Sinus rhythm  Relevant CV Studies: 12/23/16 ECHOCARDIOGRAM  Study Conclusions  - Left ventricle: The cavity size was normal. Systolic function was   normal. The estimated ejection fraction was in the range of 55%  to 60%. Wall motion was normal; there were no regional wall   motion abnormalities. Left ventricular diastolic function   parameters were normal   Laboratory Data:  Chemistry Recent Labs Lab 01/01/17 1455 01/02/17 0612  NA 138 139  K 3.4* 3.3*  CL 111 110  CO2 21* 23  GLUCOSE 109* 112*  BUN 18 15  CREATININE 0.81 0.71  CALCIUM 9.2 8.7*  GFRNONAA >60 >60  GFRAA >60 >60  ANIONGAP 6 6    Hematology Recent  Labs Lab 01/01/17 1455 01/02/17 0612  WBC 5.8 6.2  RBC 3.33* 3.24*  HGB 11.2* 10.8*  HCT 34.6* 33.3*  MCV 103.9* 102.8*  MCH 33.6 33.3  MCHC 32.4 32.4  RDW 13.8 13.4  PLT 169 182   Cardiac Enzymes Recent Labs Lab 01/02/17 0004 01/02/17 0612  TROPONINI <0.03 <0.03    Recent Labs Lab 01/01/17 1502  TROPIPOC 0.00     DDimer  Recent Labs Lab 01/01/17 1455  DDIMER 2.10*    Radiology/Studies:  Dg Chest 2 View  Result Date: 01/01/2017 CLINICAL DATA:  Initial evaluation for acute chest pain. EXAM: CHEST  2 VIEW COMPARISON:  Prior radiograph from 05/10/2016. FINDINGS: The cardiac and mediastinal silhouettes are stable in size and contour, and remain within normal limits. Aortic atherosclerosis. The lungs are normally inflated. Chronic scarring within the bilateral perihilar regions, similar to previous. No airspace consolidation, pleural effusion, or pulmonary edema is identified. There is no pneumothorax. No acute osseous abnormality identified. IMPRESSION: 1. No active cardiopulmonary disease. 2. Stable chronic parenchymal scarring. Electronically Signed   By: Jeannine Boga M.D.   On: 01/01/2017 15:09   Ct Angio Chest Pe W/cm &/or Wo Cm  Result Date: 01/01/2017 CLINICAL DATA:  Recurrent chest pain positive D-dimer EXAM: CT ANGIOGRAPHY CHEST WITH CONTRAST TECHNIQUE: Multidetector CT imaging of the chest was performed using the standard protocol during bolus administration of intravenous contrast. Multiplanar CT image reconstructions and MIPs were obtained to evaluate the vascular anatomy. CONTRAST:  100 cc Isovue 370 intravenous COMPARISON:  Radiograph 01/01/2017, CT chest 06/30/2013, 10/01/2011 FINDINGS: Cardiovascular: Satisfactory opacification of the pulmonary arteries to the segmental level. No evidence of pulmonary embolism. Non aneurysmal aorta. Atherosclerotic calcifications. Coronary artery calcifications. No large pericardial effusion. Mediastinum/Nodes: Midline  trachea. No thyroid mass. Calcified hilar and mediastinal lymph nodes. Esophagus within normal limits. Periesophageal calcified nodes as well. Lungs/Pleura: Stable parenchymal scarring within the hilar regions bilaterally. Stable left upper lobe subpleural pulmonary nodule. Similar appearance of mild fibrosis in the right upper lobe with scattered nodules. Negative for pneumothorax, pleural effusion or acute consolidation Upper Abdomen: Cyst in the left lobe of the liver. Calcified granuloma in the liver. Nonobstructing stone mid to upper pole left kidney Musculoskeletal: No chest wall abnormality. No acute or significant osseous findings. Review of the MIP images confirms the above findings. IMPRESSION: 1. Negative for acute pulmonary embolus 2. Stable perihilar scarring and fibrosis in the right upper lobe with scattered pulmonary nodules. Calcified mediastinal lymph nodes as before 3. Negative for acute infiltrate, pneumothorax or pleural effusion 4. Nonobstructing stone in the left kidney Aortic Atherosclerosis (ICD10-I70.0). Electronically Signed   By: Donavan Foil M.D.   On: 01/01/2017 20:11    Assessment and Plan:    Chest pain w/ known CAD: Dale [name] had an initial intervention to his RCA in 2000. He received a Cypher stent to his acute marginal branch of his RCA in 2009. Last cath was in 01/2012 by Dr. [name] showing 30% LAD after the  second diagonal, normal left Circumflex and patent RCA stent.  He has now had 2 negative troponins and a nonischemic EKG. His chest pain is concerning in the sense that it is becoming more frequent and bothersome. He does however have many other factors happening as well. The weight loss, hx of renal carcinoma, acute sinus infection, anemia. -- If we want to pursue ischemic work-up I would recommend starting off with a Lexiscan for risk stratification.  HTN: BP well controlled, continue current regimen.  Hyperlipidemia: LDL is 51, continue current medication regimen.    Anemia: -- per IM  Weight loss: -- per IM  GERD h/o PUD:  -- per IM  Hypothyroidism: -- per IM  Hypokalemia: -- per IM   Acute Sinusitis: -- per IM   Signed, GREENE,TIFFANY G, PA-C  01/02/2017 As above, patient seen and examined. Briefly he is an 83 year old male with past medical history of coronary artery disease, hypertension, hyperlipidemia, hypothyroidism for evaluation of chest pain. Patient has had previous PCI of his right coronary artery and acute marginal. Last catheterization in 2013 showed a 30% LAD, patent stent in the right coronary artery and normal circumflex. In reviewing his notes it appears she has had intermittent chest pain for quite some time. Over the past several weeks he has noticed chest pressure. It is substernal without radiation. No associated symptoms. Last 30 minutes to 1 hour. Some improvement with nitroglycerin. It is not exertional, pleuritic, related to food or position. He was admitted for these symptoms. Cardiology now asked to evaluate. Note echocardiogram August 2018 showed normal LV function. On physical examination patient is very frail. Multiple ecchymosis on his skin. Enzymes negative. Electrocardiogram shows sinus bradycardia with first-degree AV block and no ST changes.  1 chest pain-symptoms are atypical. He has ruled out. I will arrange a [facility] nuclear study for risk stratification.  2 hypertension-blood pressure is controlled. Continue present medications.  3 coronary artery disease-continue aspirin and statin.  4 hyperlipidemia-continue statin.  5 weight loss-further evaluation per primary care.  If nuclear study negative patient may be discharged and follow-up with [name].  Kirk Ruths, MD

## 2017-01-02 NOTE — Plan of Care (Signed)
Problem: Tissue Perfusion: Goal: Risk factors for ineffective tissue perfusion will decrease Outcome: Progressing Oxygen saturation remains in the high 90's on room air

## 2017-01-02 NOTE — Progress Notes (Signed)
Initial Nutrition Assessment  DOCUMENTATION CODES:   Severe malnutrition in context of chronic illness, Underweight  INTERVENTION:   Ensure Enlive po BID, each supplement provides 350 kcal and 20 grams of protein  Continue MVI  Encouraged small snacks between meals to optimize nutritional intake  If po intake inadequate on follow-up, recommend liberalizing diet  NUTRITION DIAGNOSIS:   Malnutrition (Severe) related to chronic illness (progressive renal cell carcinoma, CHF, CAD) as evidenced by severe depletion of body fat, severe depletion of muscle mass. GOAL:   Patient will meet greater than or equal to 90% of their needs  MONITOR:   PO intake, Supplement acceptance, Labs, Weight trends  REASON FOR ASSESSMENT:   Malnutrition Screening Tool    ASSESSMENT:   83 yo male admitted with chest pain. Pt with hx of CAD s/p stent, HTN, HLD, CHF, hiatal hernia, PUD, renal cell carcinoma  No recorded po intake. Pt NPO previously for procedure. Pt ate [place] sandwich tray during the night. Eating peanut butter crackers on visit today. Pt reports appetite good at present. Pt reports he typically eats 3 meals per day. Pt eats oatmeal for breakfast. Lunch is typically fried chicken and beans/mashed potatoes and green beans (from restaurant) and then he eats the remaining leftovers for dinner. Pt reports he used to snacks in between meals but not any more. Pt does not use nutritional supplements at home.   Pt reports UBW around 140 pounds. Current wt 117 pounds (16% wt loss Per weight encounters, pt with 12% wt loss in 7 months, 16% wt loss in 8 months)  Noted pt with hx of renal cell carcinoma; per MD note, CT scan from 01/2016 showed progression of left renal cell carcinoma  Noted possible dysphagia per MD notes. During assessment, pt reports Ensure being thick to swallow, beans "burned" when swallowing. But pt verbalizes that he has "no problems chewing or  swallowing."  Nutrition-Focused physical exam completed. Findings are mild/moderate to severe fat depletion, mild/moderate to severe muscle depletion, and no edema.   Labs: potassium 3.3 Meds: KCl, MVI  Diet Order:  Diet Heart Room service appropriate? Yes; Fluid consistency: Thin  Skin:  Reviewed, no issues  Last BM:  8/29  Height:   Ht Readings from Last 1 Encounters:  01/02/17 5\' 8"  (1.727 m)    Weight:   Wt Readings from Last 1 Encounters:  01/02/17 117 lb 9.6 oz (53.3 kg)    Ideal Body Weight:  70 kg  BMI:  Body mass index is 17.88 kg/m.  Estimated Nutritional Needs:   Kcal:  1610-9604 kcals   Protein:  80-93 g  Fluid:  >/= 1.6 L  EDUCATION NEEDS:   No education needs identified at this time  [place], [place], LDN [phone] Pager  [phone] Weekend/On-Call Pager

## 2017-01-02 NOTE — Plan of Care (Signed)
Problem: Safety: Goal: Ability to remain free from injury will improve Outcome: Progressing Pt understands to call for help for help bed exit on for moderate Risk

## 2017-01-03 ENCOUNTER — Observation Stay (HOSPITAL_BASED_OUTPATIENT_CLINIC_OR_DEPARTMENT_OTHER): Payer: Medicare Other

## 2017-01-03 DIAGNOSIS — R072 Precordial pain: Secondary | ICD-10-CM | POA: Diagnosis not present

## 2017-01-03 DIAGNOSIS — I2510 Atherosclerotic heart disease of native coronary artery without angina pectoris: Secondary | ICD-10-CM | POA: Diagnosis not present

## 2017-01-03 DIAGNOSIS — J019 Acute sinusitis, unspecified: Secondary | ICD-10-CM | POA: Diagnosis not present

## 2017-01-03 DIAGNOSIS — R079 Chest pain, unspecified: Secondary | ICD-10-CM

## 2017-01-03 DIAGNOSIS — I25110 Atherosclerotic heart disease of native coronary artery with unstable angina pectoris: Secondary | ICD-10-CM | POA: Diagnosis not present

## 2017-01-03 DIAGNOSIS — I10 Essential (primary) hypertension: Secondary | ICD-10-CM | POA: Diagnosis not present

## 2017-01-03 DIAGNOSIS — E039 Hypothyroidism, unspecified: Secondary | ICD-10-CM | POA: Diagnosis not present

## 2017-01-03 DIAGNOSIS — K219 Gastro-esophageal reflux disease without esophagitis: Secondary | ICD-10-CM | POA: Diagnosis not present

## 2017-01-03 LAB — NM MYOCAR MULTI W/SPECT W/WALL MOTION / EF
Peak HR: 88 {beats}/min
Rest HR: 58 {beats}/min

## 2017-01-03 LAB — BASIC METABOLIC PANEL
ANION GAP: 4 — AB (ref 5–15)
BUN: 20 mg/dL (ref 6–20)
CHLORIDE: 111 mmol/L (ref 101–111)
CO2: 23 mmol/L (ref 22–32)
Calcium: 9 mg/dL (ref 8.9–10.3)
Creatinine, Ser: 0.86 mg/dL (ref 0.61–1.24)
GFR calc non Af Amer: >60 mL/min (ref >60)
Glucose, Bld: 98 mg/dL (ref 65–99)
POTASSIUM: 3.9 mmol/L (ref 3.5–5.1)
Sodium: 138 mmol/L (ref 135–145)

## 2017-01-03 LAB — FOLATE RBC
FOLATE, HEMOLYSATE: 487.9 ng/mL
Folate, RBC: UNDETERMINED ng/mL

## 2017-01-03 LAB — HEMATOLOGY COMMENTS:

## 2017-01-03 MED ORDER — TECHNETIUM TC 99M TETROFOSMIN IV KIT
10.0000 | PACK | Freq: Once | INTRAVENOUS | Status: AC | PRN
  Administered 2017-01-03: 10 via INTRAVENOUS

## 2017-01-03 MED ORDER — REGADENOSON 0.4 MG/5ML IV SOLN
INTRAVENOUS | Status: AC
  Administered 2017-01-03: 0.4 mg via INTRAVENOUS
  Filled 2017-01-03: qty 5

## 2017-01-03 MED ORDER — CEFUROXIME AXETIL 500 MG PO TABS: 500.0000 mg | ORAL_TABLET | Freq: Two times a day (BID) | ORAL | 0 refills | Status: AC

## 2017-01-03 MED ORDER — REGADENOSON 0.4 MG/5ML IV SOLN
0.4000 mg | Freq: Once | INTRAVENOUS | Status: AC
  Administered 2017-01-03: 0.4 mg via INTRAVENOUS
  Filled 2017-01-03: qty 5

## 2017-01-03 MED ORDER — CEFUROXIME AXETIL 500 MG PO TABS: 500.0000 mg | ORAL_TABLET | Freq: Two times a day (BID) | ORAL | 0 refills | Status: DC

## 2017-01-03 MED ORDER — TECHNETIUM TC 99M TETROFOSMIN IV KIT
30.0000 | PACK | Freq: Once | INTRAVENOUS | Status: AC | PRN
  Administered 2017-01-03: 30 via INTRAVENOUS

## 2017-01-03 NOTE — Progress Notes (Signed)
Pt IV discontinued, catheter intact and telemetry removed. Pt discharged via wheelchair with nurse staff. Pt has all belongings including printed prescriptions.

## 2017-01-03 NOTE — Progress Notes (Signed)
Paged MD regarding diet order for pt Pt returned from stress test

## 2017-01-03 NOTE — Discharge Summary (Signed)
Physician Discharge Summary  Dale [name] LEX:[id number] DOB: 1928-12-05 DOA: 01/01/2017  PCP: Redmond School, MD Cardiologist: [name]  Admit date: 01/01/2017 Discharge date: 01/03/2017  Admitted From: HOME  Disposition: HOME    Recommendations for Outpatient Follow-up:  1. Follow up with PCP in 1 weeks 2. Follow up with [name] in 2 weeks 3. Please obtain BMP/CBC in one week 4. Please follow up on the following pending results: B12, folate  Discharge Condition: STABLE   CODE STATUS: FULL    Brief Hospitalization Summary: Please see all hospital notes, images, labs for full details of the hospitalization.  HPI: Dale [name] is a 83 y.o. male with medical history significant of HTN, HLD, CAD s/p stent, first-degree AV block, RCC, skin cancer, diastolic CHF last EF 44-96% on 12/23/2016 complaints of ; who presents with intermittent chest pain for the last month. Patient reports having crampy /heaviness feeling across his chest. Symptoms can occur when he's had rest or with activity.  He also expresses a left-sided chest pain that is different that usually occurs with exertion. He has taken nitroglycerin before with relief of symptoms, sometimes symptoms go away on their own, and then other times lying down will resolve symptoms. Associated symptoms include fatigue, weight loss approximately 20 pounds in last 8 months, right ear discomfort, postnasal drainage, nonproductive cough, and nausea. Denies having any significant diaphoresis, or headedness, orloss consciousness. Patient is a poor historian and had been previously diagnosed with renal cell carcinoma unclear treatments given previously.   He is followed by Dr. Shelva Majestic of cardiology in outpatient setting. He last followed up with the PA regarding his symptoms on 8/9. At that time the left-sided chest pain was thought to be likely related to angina and it was unclear what was causing his right-sided chest pain symptoms..   ED  Course: Upon admission into the emergency department patient was seen to be afebrile, pulse 57, all other vital signs relatively within normal limits. Labs revealed WBC 5.8, hemoglobin 11.2, MCV 103.9 , potassium 3.4, d-dimer 2.1, and troponin negative. EKG did not show any significant ischemic changes. Due to the elevated d-dimer a CT angiogram was obtained which was negative for any acute signs of pulmonary embolus. [facility] called to admit.  Chest pain: Acute. Heart score = 6. Patient with recent echocardiogram on 12/23/2016. Patient followed by [name] that outpatient setting.Patient reports 2 separate chest pains including a left-sided and one which is more centralized. - Cardiology ordered nuclear stress test which did not reveal any reversible ischemia.  DC home with follow up with [name].    Acute sinusitis: Patient reports sinus congestion and postnasal drip. Physical exam shows erythematous nasal turbinates with left ear canal with fluid behind left eardrum. - Continue Cefuroxime x 5 more days   Essential hypertension - Held metoprolol for possible need of stress test - Continue losartan, amlodipine  Coronary artery disease - Continue Plavix and isosorbide mononitrate  Anemia - Follow up outpatient with PCP  Cerumen impaction of right ear - ear lavage ordered, follow up with PCP  Hypothyroidism - TSH WNL  - Continue levothyroxine  Hypokalemia:Acute. Initial potassium 3.4 on admission. - Give 20 meq of potassium chloride 1 dose now - Continue to monitor and replace as needed  - repleted  Weight loss with previous history of renal cell carcinoma and skin cancer: Patient reports approximately 20 pounds over the last 8 months.Last CT scan of the abdomen was obtained on 01/2016 which showed  progression of the left renal cell carcinoma. May also want a question about issues with dysphagia. - ESR normal (2) - follow up with PCP and oncologist  Hyperlipidemia -  Continue atorvastatin  Jerrye Bushy, H/O PUD - continue Protonix  DVT prophylaxis:lovenox Code Status:Full Family Communication:No family memberspresent  Discharge Diagnoses:  Principal Problem:   Chest pain Active Problems:   Hypothyroidism   Essential hypertension   GERD   CAD, RCA stent, RCA new DES July 2009, Cath [place] 2011, 02/02/12   Sinusitis  Discharge Instructions: Discharge Instructions    Call MD for:  difficulty breathing, headache or visual disturbances    Complete by:  As directed    Call MD for:  extreme fatigue    Complete by:  As directed    Call MD for:  hives    Complete by:  As directed    Call MD for:  persistant dizziness or light-headedness    Complete by:  As directed    Call MD for:  persistant nausea and vomiting    Complete by:  As directed    Call MD for:  severe uncontrolled pain    Complete by:  As directed    Call MD for:  temperature >100.4    Complete by:  As directed    Diet - low sodium heart healthy    Complete by:  As directed    Increase activity slowly    Complete by:  As directed      Allergies as of 01/03/2017      Reactions   Azithromycin Other (See Comments)   Sore mouth and fever blisters around mouth, sores in nose area as well   Doxazosin Shortness Of Breath   Acetaminophen    REACTION: UNKNOWN REACTION   Atenolol    REACTION: UNKNOWN REACTION   Hydrocodone Nausea And Vomiting   Levofloxacin Other (See Comments)   Caused stomach problems.   Morphine    "made me crazy"   Penicillins Nausea And Vomiting   Has patient had a PCN reaction causing immediate rash, facial/tongue/throat swelling, SOB or lightheadedness with hypotension: No Has patient had a PCN reaction causing severe rash involving mucus membranes or skin necrosis: No Has patient had a PCN reaction that required hospitalization No Has patient had a PCN reaction occurring within the last 10 years: No If all of the above answers are "NO", then may proceed  with Cephalosporin use.   Sulfonamide Derivatives Nausea And Vomiting      Medication List    TAKE these medications   acetaZOLAMIDE 250 MG tablet Commonly known as:  DIAMOX Take 250 mg by mouth 2 (two) times daily.   amLODipine 5 MG tablet Commonly known as:  NORVASC TAKE 1 TABLET BY MOUTH  DAILY   aspirin 81 MG tablet Take 81 mg by mouth. Every other day   atorvastatin 40 MG tablet Commonly known as:  LIPITOR TAKE 1 TABLET BY MOUTH  DAILY What changed:  See the new instructions.   cefUROXime 500 MG tablet Commonly known as:  CEFTIN Take 1 tablet (500 mg total) by mouth 2 (two) times daily with a meal.   clopidogrel 75 MG tablet Commonly known as:  PLAVIX TAKE 1 TABLET BY MOUTH  DAILY   dorzolamide 2 % ophthalmic solution Commonly known as:  TRUSOPT Place 1 drop into both eyes 2 (two) times daily.   finasteride 5 MG tablet Commonly known as:  PROSCAR Take 5 mg by mouth daily. AT [facility]  guaiFENesin 600 MG 12 hr tablet Commonly known as:  MUCINEX Take 1,200 mg by mouth daily.   hydrochlorothiazide 12.5 MG tablet Commonly known as:  HYDRODIURIL TAKE 1 TABLET BY MOUTH ONCE A DAY AS NEEDED FOR FLUID  RETENTION   isosorbide mononitrate 60 MG 24 hr tablet Commonly known as:  IMDUR TAKE 1 TABLET BY MOUTH IN  THE MORNING AND 1/2 TABLET  AT NIGHT What changed:  See the new instructions.   latanoprost 0.005 % ophthalmic solution Commonly known as:  XALATAN Place 1 drop into both eyes at bedtime.   levothyroxine 100 MCG tablet Commonly known as:  SYNTHROID, LEVOTHROID TAKE 1 TABLET BY MOUTH  DAILY What changed:  See the new instructions.   losartan 100 MG tablet Commonly known as:  COZAAR Take 1 tablet (100 mg total) by mouth daily.   metoprolol tartrate 25 MG tablet Commonly known as:  LOPRESSOR TAKE ONE-HALF TABLET BY  MOUTH TWICE A DAY What changed:  See the new instructions.   mometasone 0.1 % ointment Commonly known as:  ELOCON Apply topically  daily.   multivitamin tablet Take 1 tablet by mouth every evening.   nitroGLYCERIN 0.4 MG SL tablet Commonly known as:  NITROSTAT Place 1 tablet (0.4 mg total) under the tongue every 5 (five) minutes as needed. For chest pain   ondansetron 4 MG disintegrating tablet Commonly known as:  ZOFRAN ODT 61m ODT q4 hours prn nausea/vomit What changed:  how much to take  how to take this  when to take this  reasons to take this  additional instructions   pantoprazole 40 MG tablet Commonly known as:  PROTONIX Take 1 tablet (40 mg total) by mouth 2 (two) times daily before a meal. What changed:  when to take this   potassium chloride 10 MEQ tablet Commonly known as:  K-DUR,KLOR-CON TAKE 1 TABLET BY MOUTH  DAILY   PRESERVISION/LUTEIN Caps Take 1 capsule by mouth 2 (two) times daily.   timolol 0.5 % ophthalmic solution Commonly known as:  TIMOPTIC Place 1 drop into both eyes daily.   triamcinolone cream 0.1 % Commonly known as:  KENALOG Apply 1 application topically 2 (two) times daily as needed (irritation).   vitamin C 500 MG tablet Commonly known as:  ASCORBIC ACID Take 500 mg by mouth daily.   vitamin E 200 UNIT capsule Take 200 Units by mouth daily. [facility]           Discharge Care Instructions        Start     Ordered   01/04/17 0000  cefUROXime (CEFTIN) 500 MG tablet  2 times daily with meals     01/03/17 1405   01/03/17 0000  Increase activity slowly     01/03/17 1405   01/03/17 0000  Diet - low sodium heart healthy     01/03/17 1405   01/03/17 0000  Call MD for:  temperature >100.4     01/03/17 1405   01/03/17 0000  Call MD for:  persistant nausea and vomiting     01/03/17 1405   01/03/17 0000  Call MD for:  severe uncontrolled pain     01/03/17 1405   01/03/17 0000  Call MD for:  hives     01/03/17 1405   01/03/17 0000  Call MD for:  persistant dizziness or light-headedness     01/03/17 1405   01/03/17 0000  Call MD for:  extreme fatigue      01/03/17 1405  01/03/17 0000  Call MD for:  difficulty breathing, headache or visual disturbances     01/03/17 1405     Follow-up Information    Redmond School, MD. Schedule an appointment as soon as possible for a visit in 1 week(s).   Specialty:  Internal Medicine Why:  Hospital Follow Up  Contact information: [address] [place] [address] [phone]        Troy Sine, MD. Schedule an appointment as soon as possible for a visit in 2 week(s).   Specialty:  Cardiology Why:  Hospital Follow Up  Contact information: [address] [place] [address] [phone]          Allergies  Allergen Reactions  . Azithromycin Other (See Comments)    Sore mouth and fever blisters around mouth, sores in nose area as well  . Doxazosin Shortness Of Breath  . Acetaminophen     REACTION: UNKNOWN REACTION  . Atenolol     REACTION: UNKNOWN REACTION  . Hydrocodone Nausea And Vomiting  . Levofloxacin Other (See Comments)    Caused stomach problems.  . Morphine     "made me crazy"  . Penicillins Nausea And Vomiting    Has patient had a PCN reaction causing immediate rash, facial/tongue/throat swelling, SOB or lightheadedness with hypotension: No Has patient had a PCN reaction causing severe rash involving mucus membranes or skin necrosis: No Has patient had a PCN reaction that required hospitalization No Has patient had a PCN reaction occurring within the last 10 years: No If all of the above answers are "NO", then may proceed with Cephalosporin use.   . Sulfonamide Derivatives Nausea And Vomiting   Current Discharge Medication List    START taking these medications   Details  cefUROXime (CEFTIN) 500 MG tablet Take 1 tablet (500 mg total) by mouth 2 (two) times daily with a meal. Qty: 10 tablet, Refills: 0      CONTINUE these medications which have NOT CHANGED   Details  acetaZOLAMIDE (DIAMOX) 250 MG tablet Take 250 mg by mouth 2 (two)  times daily.    amLODipine (NORVASC) 5 MG tablet TAKE 1 TABLET BY MOUTH  DAILY Qty: 90 tablet, Refills: 1    aspirin 81 MG tablet Take 81 mg by mouth. Every other day    atorvastatin (LIPITOR) 40 MG tablet TAKE 1 TABLET BY MOUTH  DAILY Qty: 90 tablet, Refills: 2    clopidogrel (PLAVIX) 75 MG tablet TAKE 1 TABLET BY MOUTH  DAILY Qty: 90 tablet, Refills: 3    dorzolamide (TRUSOPT) 2 % ophthalmic solution Place 1 drop into both eyes 2 (two) times daily.     finasteride (PROSCAR) 5 MG tablet Take 5 mg by mouth daily. AT [facility]    guaiFENesin (MUCINEX) 600 MG 12 hr tablet Take 1,200 mg by mouth daily.     hydrochlorothiazide (HYDRODIURIL) 12.5 MG tablet TAKE 1 TABLET BY MOUTH ONCE A DAY AS NEEDED FOR FLUID  RETENTION Qty: 90 tablet, Refills: 2    isosorbide mononitrate (IMDUR) 60 MG 24 hr tablet TAKE 1 TABLET BY MOUTH IN  THE MORNING AND 1/2 TABLET  AT NIGHT Qty: 135 tablet, Refills: 3    latanoprost (XALATAN) 0.005 % ophthalmic solution Place 1 drop into both eyes at bedtime.    levothyroxine (SYNTHROID, LEVOTHROID) 100 MCG tablet TAKE 1 TABLET BY MOUTH  DAILY Qty: 90 tablet, Refills: 3    losartan (COZAAR) 100 MG tablet Take 1 tablet (100 mg total) by mouth  daily. Qty: 15 tablet, Refills: 0    metoprolol tartrate (LOPRESSOR) 25 MG tablet TAKE ONE-HALF TABLET BY  MOUTH TWICE A DAY Qty: 90 tablet, Refills: 3    Multiple Vitamin (MULTIVITAMIN) tablet Take 1 tablet by mouth every evening.     Multiple Vitamins-Minerals (PRESERVISION/LUTEIN) CAPS Take 1 capsule by mouth 2 (two) times daily.     nitroGLYCERIN (NITROSTAT) 0.4 MG SL tablet Place 1 tablet (0.4 mg total) under the tongue every 5 (five) minutes as needed. For chest pain Qty: 25 tablet, Refills: 3    ondansetron (ZOFRAN ODT) 4 MG disintegrating tablet 57m ODT q4 hours prn nausea/vomit Qty: 4 tablet, Refills: 0    pantoprazole (PROTONIX) 40 MG tablet Take 1 tablet (40 mg total) by mouth 2 (two) times daily before a  meal. Qty: 180 tablet, Refills: 2    potassium chloride (K-DUR,KLOR-CON) 10 MEQ tablet TAKE 1 TABLET BY MOUTH  DAILY Qty: 90 tablet, Refills: 1    timolol (TIMOPTIC) 0.5 % ophthalmic solution Place 1 drop into both eyes daily.     triamcinolone (KENALOG) 0.1 % cream Apply 1 application topically 2 (two) times daily as needed (irritation).     vitamin C (ASCORBIC ACID) 500 MG tablet Take 500 mg by mouth daily.     vitamin E 200 UNIT capsule Take 200 Units by mouth daily. AT [facility]    mometasone (ELOCON) 0.1 % ointment Apply topically daily.        Procedures/Studies: Dg Chest 2 View  Result Date: 01/01/2017 CLINICAL DATA:  Initial evaluation for acute chest pain. EXAM: CHEST  2 VIEW COMPARISON:  Prior radiograph from 05/10/2016. FINDINGS: The cardiac and mediastinal silhouettes are stable in size and contour, and remain within normal limits. Aortic atherosclerosis. The lungs are normally inflated. Chronic scarring within the bilateral perihilar regions, similar to previous. No airspace consolidation, pleural effusion, or pulmonary edema is identified. There is no pneumothorax. No acute osseous abnormality identified. IMPRESSION: 1. No active cardiopulmonary disease. 2. Stable chronic parenchymal scarring. Electronically Signed   By: BJeannine BogaM.D.   On: 01/01/2017 15:09   Ct Angio Chest Pe W/cm &/or Wo Cm  Result Date: 01/01/2017 CLINICAL DATA:  Recurrent chest pain positive D-dimer EXAM: CT ANGIOGRAPHY CHEST WITH CONTRAST TECHNIQUE: Multidetector CT imaging of the chest was performed using the standard protocol during bolus administration of intravenous contrast. Multiplanar CT image reconstructions and MIPs were obtained to evaluate the vascular anatomy. CONTRAST:  100 cc Isovue 370 intravenous COMPARISON:  Radiograph 01/01/2017, CT chest 06/30/2013, 10/01/2011 FINDINGS: Cardiovascular: Satisfactory opacification of the pulmonary arteries to the segmental level. No evidence of  pulmonary embolism. Non aneurysmal aorta. Atherosclerotic calcifications. Coronary artery calcifications. No large pericardial effusion. Mediastinum/Nodes: Midline trachea. No thyroid mass. Calcified hilar and mediastinal lymph nodes. Esophagus within normal limits. Periesophageal calcified nodes as well. Lungs/Pleura: Stable parenchymal scarring within the hilar regions bilaterally. Stable left upper lobe subpleural pulmonary nodule. Similar appearance of mild fibrosis in the right upper lobe with scattered nodules. Negative for pneumothorax, pleural effusion or acute consolidation Upper Abdomen: Cyst in the left lobe of the liver. Calcified granuloma in the liver. Nonobstructing stone mid to upper pole left kidney Musculoskeletal: No chest wall abnormality. No acute or significant osseous findings. Review of the MIP images confirms the above findings. IMPRESSION: 1. Negative for acute pulmonary embolus 2. Stable perihilar scarring and fibrosis in the right upper lobe with scattered pulmonary nodules. Calcified mediastinal lymph nodes as before 3. Negative for acute infiltrate, pneumothorax or  pleural effusion 4. Nonobstructing stone in the left kidney Aortic Atherosclerosis (ICD10-I70.0). Electronically Signed   By: Donavan Foil M.D.   On: 01/01/2017 20:11   Nm Myocar Multi W/spect W/wall Motion / Ef  Result Date: 01/03/2017 CLINICAL DATA:  Chest pain. Prior angioplasty with stenting. Hypertension. Former smoker. EXAM: MYOCARDIAL IMAGING WITH SPECT (REST AND PHARMACOLOGIC-STRESS) GATED LEFT VENTRICULAR WALL MOTION STUDY LEFT VENTRICULAR EJECTION FRACTION TECHNIQUE: Standard myocardial SPECT imaging was performed after resting intravenous injection of 10 mCi Tc-15mtetrofosmin. Subsequently, intravenous infusion of Lexiscan was performed under the supervision of the Cardiology staff. At peak effect of the drug, 30 mCi Tc-963metrofosmin was injected intravenously and standard myocardial SPECT imaging was  performed. Quantitative gated imaging was also performed to evaluate left ventricular wall motion, and estimate left ventricular ejection fraction. COMPARISON:  None. FINDINGS: Perfusion: No decreased activity in the left ventricle on stress imaging to suggest reversible ischemia or infarction. Wall Motion: Normal left ventricular wall motion. No left ventricular dilation. Left Ventricular Ejection Fraction: 82 % End diastolic volume 69 ml End systolic volume 12 ml IMPRESSION: 1. No reversible ischemia or infarction. 2. Normal left ventricular wall motion. 3. Left ventricular ejection fraction 82% 4. Non invasive risk stratification*: Low *2012 Appropriate Use Criteria for Coronary Revascularization Focused Update: J Am Coll Cardiol. 202706;23(7):[email or web address] Electronically Signed   By: TaKerby Moors.D.   On: 01/03/2017 13:08      Subjective: Pt having no further symptoms of chest pain or SOB.   Discharge Exam: Vitals:   01/03/17 1056 01/03/17 1216  BP: (!) 137/52 (!) 112/58  Pulse:  67  Resp:  18  Temp:  98.1 F (36.7 C)  SpO2:  98%   Vitals:   01/03/17 1052 01/03/17 1054 01/03/17 1056 01/03/17 1216  BP: (!) 116/50 (!) 128/51 (!) 137/52 (!) 112/58  Pulse:    67  Resp:    18  Temp:    98.1 F (36.7 C)  TempSrc:    Oral  SpO2:    98%  Weight:      Height:       General: Pt is alert, awake, not in acute distress Cardiovascular: RRR, S1/S2 +, no rubs, no gallops Respiratory: CTA bilaterally, no wheezing, no rhonchi Abdominal: Soft, NT, ND, bowel sounds + Extremities: no edema, no cyanosis   The results of significant diagnostics from this hospitalization (including imaging, microbiology, ancillary and laboratory) are listed below for reference.     Microbiology: No results found for this or any previous visit (from the past 240 hour(s)).   Labs: BNP (last 3 results) No results for input(s): BNP in the last 8760  hours. Basic Metabolic Panel:  Recent Labs Lab 01/01/17 1455 01/02/17 0612 01/03/17 0300  NA 138 139 138  K 3.4* 3.3* 3.9  CL 111 110 111  CO2 21* 23 23  GLUCOSE 109* 112* 98  BUN '18 15 20  ' CREATININE 0.81 0.71 0.86  CALCIUM 9.2 8.7* 9.0   Liver Function Tests: No results for input(s): AST, ALT, ALKPHOS, BILITOT, PROT, ALBUMIN in the last 168 hours. No results for input(s): LIPASE, AMYLASE in the last 168 hours. No results for input(s): AMMONIA in the last 168 hours. CBC:  Recent Labs Lab 01/01/17 1455 01/02/17 0612  WBC 5.8 6.2  NEUTROABS  --  4.1  HGB 11.2* 10.8*  HCT 34.6* 33.3*  MCV 103.9* 102.8*  PLT 169 182   Cardiac Enzymes:  Recent Labs Lab 01/02/17 0004 01/02/17 0612 01/02/17 1155  TROPONINI <0.03 <0.03 <0.03   BNP: Invalid input(s): POCBNP CBG: No results for input(s): GLUCAP in the last 168 hours. D-Dimer  Recent Labs  01/01/17 1455  DDIMER 2.10*   Hgb A1c No results for input(s): HGBA1C in the last 72 hours. Lipid Profile  Recent Labs  01/02/17 0612  CHOL 98  HDL 37*  LDLCALC 51  TRIG 48  CHOLHDL 2.6   Thyroid function studies  Recent Labs  01/02/17 0004  TSH 1.304   Anemia work up  Recent Labs  01/02/17 0612  VITAMINB12 797   Urinalysis    Component Value Date/Time   COLORURINE YELLOW 02/02/2012 0630   APPEARANCEUR CLOUDY (A) 02/02/2012 0630   LABSPEC 1.022 02/02/2012 0630   PHURINE 7.5 02/02/2012 0630   GLUCOSEU NEGATIVE 02/02/2012 0630   HGBUR NEGATIVE 02/02/2012 0630   BILIRUBINUR NEGATIVE 02/02/2012 0630   KETONESUR NEGATIVE 02/02/2012 0630   PROTEINUR NEGATIVE 02/02/2012 0630   UROBILINOGEN 1.0 02/02/2012 0630   NITRITE NEGATIVE 02/02/2012 0630   LEUKOCYTESUR NEGATIVE 02/02/2012 0630   Sepsis Labs Invalid input(s): PROCALCITONIN,  WBC,  LACTICIDVEN Microbiology No results found for this or any previous visit (from the past 240 hour(s)).  Time coordinating discharge:   SIGNED:  Irwin Brakeman,  MD  [facility] 01/03/2017, 2:08 PM Pager [phone]  If 7PM-7AM, please contact night-coverage [email or web address] Password [email or web address]

## 2017-01-03 NOTE — Progress Notes (Signed)
Progress Note  Patient Name: Dale [name] Date of Encounter: 01/03/2017  Primary Cardiologist: [name]  Subjective   Pt seen at stress test.  No further chest pain or shortness of breath.   Inpatient Medications    Scheduled Meds: . acetaZOLAMIDE  250 mg Oral BID  . amLODipine  5 mg Oral Daily  . aspirin  81 mg Oral Daily  . atorvastatin  40 mg Oral q1800  . cefUROXime  500 mg Oral BID WC  . clopidogrel  75 mg Oral Daily  . dorzolamide  1 drop Both Eyes BID  . enoxaparin (LOVENOX) injection  40 mg Subcutaneous QHS  . feeding supplement (ENSURE ENLIVE)  237 mL Oral BID BM  . finasteride  5 mg Oral Q1200  . guaiFENesin  1,200 mg Oral Daily  . isosorbide mononitrate  30 mg Oral QHS  . isosorbide mononitrate  60 mg Oral Daily  . latanoprost  1 drop Both Eyes QHS  . levothyroxine  100 mcg Oral QPM  . losartan  100 mg Oral Daily  . metoprolol tartrate  12.5 mg Oral BID  . multivitamin with minerals  1 tablet Oral QPM  . pantoprazole  40 mg Oral BID PC  . regadenoson  0.4 mg Intravenous Once  . timolol  1 drop Both Eyes Daily   Continuous Infusions:  PRN Meds: gi cocktail, nitroGLYCERIN, ondansetron (ZOFRAN) IV, triamcinolone cream   Vital Signs    Vitals:   01/02/17 1518 01/02/17 2004 01/03/17 0555 01/03/17 1019  BP: (!) 115/54 (!) 117/58 133/62 138/62  Pulse: (!) 57 71 70   Resp:  18 18   Temp:  98.2 F (36.8 C) 98.1 F (36.7 C)   TempSrc:  Oral Oral   SpO2:  98% 100%   Weight:   116 lb 8 oz (52.8 kg)   Height:        Intake/Output Summary (Last 24 hours) at 01/03/17 1048 Last data filed at 01/03/17 0847  Gross per 24 hour  Intake                0 ml  Output              550 ml  Net             -550 ml   Filed Weights   01/01/17 1441 01/02/17 0025 01/03/17 0555  Weight: 124 lb (56.2 kg) 117 lb 9.6 oz (53.3 kg) 116 lb 8 oz (52.8 kg)    Telemetry    NSR - Personally Reviewed  ECG    No new tracings  Physical Exam   GEN: Frail elderly  male. No acute distress.   Neck: No JVD Cardiac: RRR, no murmurs, rubs, or gallops.  Respiratory: Clear to auscultation bilaterally. GI: Soft, nontender, non-distended  MS: No edema; No deformity. Multiple ecchymoses Neuro:  Nonfocal  Psych: Normal affect   Labs    Chemistry Recent Labs Lab 01/01/17 1455 01/02/17 0612 01/03/17 0300  NA 138 139 138  K 3.4* 3.3* 3.9  CL 111 110 111  CO2 21* 23 23  GLUCOSE 109* 112* 98  BUN 18 15 20   CREATININE 0.81 0.71 0.86  CALCIUM 9.2 8.7* 9.0  GFRNONAA >60 >60 >60  GFRAA >60 >60 >60  ANIONGAP 6 6 4*     Hematology Recent Labs Lab 01/01/17 1455 01/02/17 0612  WBC 5.8 6.2  RBC 3.33* 3.24*  HGB 11.2* 10.8*  HCT 34.6* 33.3*  MCV 103.9* 102.8*  MCH 33.6 33.3  MCHC 32.4 32.4  RDW 13.8 13.4  PLT 169 182    Cardiac Enzymes Recent Labs Lab 01/02/17 0004 01/02/17 0612 01/02/17 1155  TROPONINI <0.03 <0.03 <0.03    Recent Labs Lab 01/01/17 1502  TROPIPOC 0.00     BNPNo results for input(s): BNP, PROBNP in the last 168 hours.   DDimer  Recent Labs Lab 01/01/17 1455  DDIMER 2.10*     Radiology    Dg Chest 2 View  Result Date: 01/01/2017 CLINICAL DATA:  Initial evaluation for acute chest pain. EXAM: CHEST  2 VIEW COMPARISON:  Prior radiograph from 05/10/2016. FINDINGS: The cardiac and mediastinal silhouettes are stable in size and contour, and remain within normal limits. Aortic atherosclerosis. The lungs are normally inflated. Chronic scarring within the bilateral perihilar regions, similar to previous. No airspace consolidation, pleural effusion, or pulmonary edema is identified. There is no pneumothorax. No acute osseous abnormality identified. IMPRESSION: 1. No active cardiopulmonary disease. 2. Stable chronic parenchymal scarring. Electronically Signed   By: Jeannine Boga M.D.   On: 01/01/2017 15:09   Ct Angio Chest Pe W/cm &/or Wo Cm  Result Date: 01/01/2017 CLINICAL DATA:  Recurrent chest pain positive  D-dimer EXAM: CT ANGIOGRAPHY CHEST WITH CONTRAST TECHNIQUE: Multidetector CT imaging of the chest was performed using the standard protocol during bolus administration of intravenous contrast. Multiplanar CT image reconstructions and MIPs were obtained to evaluate the vascular anatomy. CONTRAST:  100 cc Isovue 370 intravenous COMPARISON:  Radiograph 01/01/2017, CT chest 06/30/2013, 10/01/2011 FINDINGS: Cardiovascular: Satisfactory opacification of the pulmonary arteries to the segmental level. No evidence of pulmonary embolism. Non aneurysmal aorta. Atherosclerotic calcifications. Coronary artery calcifications. No large pericardial effusion. Mediastinum/Nodes: Midline trachea. No thyroid mass. Calcified hilar and mediastinal lymph nodes. Esophagus within normal limits. Periesophageal calcified nodes as well. Lungs/Pleura: Stable parenchymal scarring within the hilar regions bilaterally. Stable left upper lobe subpleural pulmonary nodule. Similar appearance of mild fibrosis in the right upper lobe with scattered nodules. Negative for pneumothorax, pleural effusion or acute consolidation Upper Abdomen: Cyst in the left lobe of the liver. Calcified granuloma in the liver. Nonobstructing stone mid to upper pole left kidney Musculoskeletal: No chest wall abnormality. No acute or significant osseous findings. Review of the MIP images confirms the above findings. IMPRESSION: 1. Negative for acute pulmonary embolus 2. Stable perihilar scarring and fibrosis in the right upper lobe with scattered pulmonary nodules. Calcified mediastinal lymph nodes as before 3. Negative for acute infiltrate, pneumothorax or pleural effusion 4. Nonobstructing stone in the left kidney Aortic Atherosclerosis (ICD10-I70.0). Electronically Signed   By: Donavan Foil M.D.   On: 01/01/2017 20:11    Cardiac Studies   12/23/16 ECHOCARDIOGRAM  Study Conclusions  - Left ventricle: The cavity size was normal. Systolic function was normal.  The estimated ejection fraction was in the range of 55% to 60%. Wall motion was normal; there were no regional wall motion abnormalities. Left ventricular diastolic function parameters were normal  Patient Profile     83 y.o. male  with a hx of CAD, HTN, HLD, hypothyroidism and first-degree AV block. He had initial intervention to his RCA in 2000. He received a Cypher stent to his acute marginal branch of his RCA in 2009. Last cath was in 01/2012 by Dr. [name] showing 30% LAD after the second diagonal, nml L Circ and patent RCA stent. He was diagnosed with renal cell carcinoma in January 2018 confirmed on biopsy, admitted for the evaluation of chest pain.  Assessment &  Plan    1 chest pain-symptoms are atypical. He has ruled out. No further chest pain. He is presently underoging Lexiscan nuclear study for risk stratification.  2 hypertension-blood pressure is controlled. Continue present medications.  3 coronary artery disease-continue aspirin and statin.  4 hyperlipidemia-continue lipitor.  5 weight loss-further evaluation per primary care as outpt.  If nuclear study negative patient may be discharged and follow-up with [name].   Signed, Daune Perch, NP  01/03/2017, 10:48 AM   As above; pt seen and examined; no recurrent chest pain; nuclear study with no infarct or ischemia; pt can be DCed on preadmission meds and fu with Dr Claiborne Billings. We will sign off; please call with questions. Kirk Ruths

## 2017-01-03 NOTE — Progress Notes (Signed)
    Patient presented for Lexiscan nuclear stress test. Tolerated procedure well. Pending final stress imaging result.  Daune Perch, AGNP-C 01/03/2017  11:04 AM Pager: [phone]

## 2017-01-08 ENCOUNTER — Telehealth: Payer: Self-pay | Admitting: Cardiovascular Disease

## 2017-01-08 NOTE — Telephone Encounter (Signed)
Wife returning call to reschedule her echo.   Disregard encounter.

## 2017-01-08 NOTE — Telephone Encounter (Signed)
New Message ° ° pt wife verbalized that she is returning call for rn °

## 2017-01-13 ENCOUNTER — Other Ambulatory Visit: Payer: Self-pay | Admitting: Cardiovascular Disease

## 2017-01-13 MED ORDER — AMLODIPINE BESYLATE 5 MG PO TABS: 5.0000 mg | ORAL_TABLET | Freq: Every day | ORAL | 3 refills | Status: DC

## 2017-01-13 NOTE — Telephone Encounter (Signed)
New message     *STAT* If patient is at the pharmacy, call can be transferred to refill team.   1. Which medications need to be refilled? (please list name of each medication and dose if known) amlodipine 5 mg  2. Which pharmacy/location (including street and city if local pharmacy) is medication to be sent to? [facility]  3. Do they need a 30 day or 90 day supply? 90 day

## 2017-01-13 NOTE — Telephone Encounter (Signed)
Rx(s) sent to pharmacy electronically.  

## 2017-01-15 DIAGNOSIS — H1132 Conjunctival hemorrhage, left eye: Secondary | ICD-10-CM | POA: Diagnosis not present

## 2017-01-22 ENCOUNTER — Encounter: Payer: Self-pay | Admitting: Gastroenterology

## 2017-01-22 ENCOUNTER — Ambulatory Visit (INDEPENDENT_AMBULATORY_CARE_PROVIDER_SITE_OTHER): Payer: Medicare Other | Admitting: Gastroenterology

## 2017-01-22 VITALS — BP 100/52 | HR 64 | Temp 97.8°F | Wt 118.6 lb

## 2017-01-22 DIAGNOSIS — K219 Gastro-esophageal reflux disease without esophagitis: Secondary | ICD-10-CM | POA: Diagnosis not present

## 2017-01-22 DIAGNOSIS — I2510 Atherosclerotic heart disease of native coronary artery without angina pectoris: Secondary | ICD-10-CM

## 2017-01-22 DIAGNOSIS — R634 Abnormal weight loss: Secondary | ICD-10-CM | POA: Diagnosis not present

## 2017-01-22 NOTE — Patient Instructions (Signed)
1. I will review your labs and let you know what Dr. Gala Romney recommends next for your weight loss.

## 2017-01-22 NOTE — Progress Notes (Signed)
Primary Care Physician: Redmond School, MD  Primary Gastroenterologist:  Garfield Cornea, MD   Chief Complaint  Patient presents with  . Weight Loss    HPI: Dale [name] is a 83 y.o. male here for follow up. Last seen in 12/2015. Has h/o gerd/dysphagia. Since we last saw him he had cryoablation of left renal cell carcinoma. He was hospitalized few weeks ago with chest pain and treated for sinusitis. States while in the hospital a pill got stuck in neck area and created pretty bad burning. Heartburn has been terrible. On protonix bid and tums also. No cough, congestion.   Patient has lost over 25 pounds in the past one year, 20 pounds since 05/2016. Denies abd pain, constipation, melena, brbpr, vomiting. Appetite is good. States he eats well without any change in diet.   Labs 12/2016 from [facility] indicate anemia with Hgb 10.8, MCV 102.8. Normal B12/folate. Normal sed rate. TSH normal. Creatinine normal.   CTA chest 12/2016: No PE, stable perihilar scarring and fibrosis in RUL with scattered pulmonary nodules.  CT Abd with and without contrast 09/2016: ?gb adenomyomatosis, 55mm posterior left adrenal nodule, granulation/scarring at site of treated left renal neoplasm.   Current Outpatient Prescriptions  Medication Sig Dispense Refill  . acetaZOLAMIDE (DIAMOX) 250 MG tablet Take 250 mg by mouth 2 (two) times daily.    Marland Kitchen amLODipine (NORVASC) 5 MG tablet Take 1 tablet (5 mg total) by mouth daily. 90 tablet 3  . aspirin 81 MG tablet Take 81 mg by mouth. Every other day    . atorvastatin (LIPITOR) 40 MG tablet TAKE 1 TABLET BY MOUTH  DAILY (Patient taking differently: TAKE 1 TABLET BY MOUTH  EVENING) 90 tablet 2  . clopidogrel (PLAVIX) 75 MG tablet TAKE 1 TABLET BY MOUTH  DAILY 90 tablet 3  . dorzolamide (TRUSOPT) 2 % ophthalmic solution Place 1 drop into both eyes 2 (two) times daily.     . finasteride (PROSCAR) 5 MG tablet Take 5 mg by mouth daily. AT NOON    . guaiFENesin (MUCINEX) 600 MG  12 hr tablet Take 1,200 mg by mouth daily.     . hydrochlorothiazide (HYDRODIURIL) 12.5 MG tablet TAKE 1 TABLET BY MOUTH ONCE A DAY AS NEEDED FOR FLUID  RETENTION 90 tablet 2  . isosorbide mononitrate (IMDUR) 60 MG 24 hr tablet TAKE 1 TABLET BY MOUTH IN  THE MORNING AND 1/2 TABLET  AT NIGHT (Patient taking differently: TAKE 60 MG EVERY MORNING AND 30 MG EVERY NIGHT) 135 tablet 3  . latanoprost (XALATAN) 0.005 % ophthalmic solution Place 1 drop into both eyes at bedtime.    Marland Kitchen levothyroxine (SYNTHROID, LEVOTHROID) 100 MCG tablet TAKE 1 TABLET BY MOUTH  DAILY (Patient taking differently: TAKE 100 MCG BY MOUTH EVERY EVENING) 90 tablet 3  . losartan (COZAAR) 100 MG tablet Take 1 tablet (100 mg total) by mouth daily. 15 tablet 0  . metoprolol tartrate (LOPRESSOR) 25 MG tablet TAKE ONE-HALF TABLET BY  MOUTH TWICE A DAY (Patient taking differently: TAKE 12.5MG  BY MOUTH TWICE DAILY) 90 tablet 3  . mometasone (ELOCON) 0.1 % ointment Apply topically daily.    . Multiple Vitamin (MULTIVITAMIN) tablet Take 1 tablet by mouth every evening.     . Multiple Vitamins-Minerals (PRESERVISION/LUTEIN) CAPS Take 1 capsule by mouth 2 (two) times daily.     . ondansetron (ZOFRAN ODT) 4 MG disintegrating tablet 4mg  ODT q4 hours prn nausea/vomit (Patient taking differently: Take 4 mg by mouth every  4 (four) hours as needed for nausea or vomiting. ) 4 tablet 0  . pantoprazole (PROTONIX) 40 MG tablet Take 1 tablet (40 mg total) by mouth 2 (two) times daily before a meal. (Patient taking differently: Take 40 mg by mouth 2 (two) times daily after a meal. ) 180 tablet 2  . timolol (TIMOPTIC) 0.5 % ophthalmic solution Place 1 drop into both eyes daily.     Marland Kitchen triamcinolone (KENALOG) 0.1 % cream Apply 1 application topically 2 (two) times daily as needed (irritation).     . vitamin C (ASCORBIC ACID) 500 MG tablet Take 500 mg by mouth daily.     . vitamin E 200 UNIT capsule Take 200 Units by mouth daily. AT NOON    . nitroGLYCERIN  (NITROSTAT) 0.4 MG SL tablet Place 1 tablet (0.4 mg total) under the tongue every 5 (five) minutes as needed. For chest pain (Patient not taking: Reported on 01/22/2017) 25 tablet 3  . potassium chloride (K-DUR,KLOR-CON) 10 MEQ tablet TAKE 1 TABLET BY MOUTH  DAILY 90 tablet 1   No current facility-administered medications for this visit.     Allergies as of 01/22/2017 - Review Complete 01/22/2017  Allergen Reaction Noted  . Azithromycin Other (See Comments) 06/03/2013  . Doxazosin Shortness Of Breath 02/11/2012  . Acetaminophen    . Atenolol    . Hydrocodone Nausea And Vomiting 08/15/2011  . Levofloxacin Other (See Comments) 09/26/2014  . Morphine    . Penicillins Nausea And Vomiting   . Sulfonamide derivatives Nausea And Vomiting    Past Medical History:  Diagnosis Date  . Allergic rhinitis   . Anal fissure   . Anginal pain ([place])   . Asthma   . Cancer ([place])    skin  . Chest pain   . CHF (congestive heart failure) ([place])   . Coronary heart disease    s/p stenting. cath in 01/2012 noncritical occlusion  . Dysrhythmia    1st degree heart block  . GERD (gastroesophageal reflux disease)   . Glaucoma   . Hiatal hernia   . Hyperlipidemia   . Hypertension   . Hypothyroidism   . Idiopathic thrombocytopenic purpura ([place]) 2002  . Macular degeneration   . Nephrolithiasis   . PUD (peptic ulcer disease)    remote  . Sarcoidosis    pulmonary  . Schatzki's ring    Past Surgical History:  Procedure Laterality Date  . cardiac stents    . COLONOSCOPY  10/30/2006   Normal rectum, sigmoid diverticula.Remainder of colonic mucosa appeared normal.  . CORONARY ANGIOPLASTY WITH STENT PLACEMENT     about 10 years ago per pt (around 2007)  . ESOPHAGOGASTRODUODENOSCOPY  06/19/2004   Two esophageal rings and esophageal web as described above.  All of these were disrupted by passing 56-French Venia Minks dilator/ Candida esophagitis,which appears to be incidental given history of   antibiotic use,  but nevertheless will be treated.  . ESOPHAGOGASTRODUODENOSCOPY  10/30/2006   Distal tandem esophageal ring status post dilation disruption as  described above.  Otherwise normal esophagus/  Small hiatal hernia otherwise normal stomach, D1 and D2  . ESOPHAGOGASTRODUODENOSCOPY N/A 03/22/2015   Dr.Rourk- noncritical schatzki's ring and hiatal hernia-o/w normal EGD.   Marland Kitchen ESOPHAGOGASTRODUODENOSCOPY (EGD) WITH ESOPHAGEAL DILATION  03/04/2012   RMR- schatzki's ring, hiatal hernia, polypoid gastric mucosa, bx= minimally active gastritis.  Everlean Alstrom GENERIC HISTORICAL  03/06/2016   IR RADIOLOGIST EVAL & MGMT 03/06/2016 Aletta Edouard, MD [facility] INTERV RAD  . IR GENERIC HISTORICAL  06/18/2016   IR RADIOLOGIST EVAL & MGMT 06/18/2016 Aletta Edouard, MD [facility] INTERV RAD  . IR RADIOLOGIST EVAL & MGMT  10/01/2016  . LEFT HEART CATH N/A 02/02/2012   Procedure: LEFT HEART CATH;  Surgeon: Lorretta Harp, MD;  Location: [facility] CATH LAB;  Service: Cardiovascular;  Laterality: N/A;  . MEDIASTINOSCOPY     for dx sarcoid  . RADIOLOGY WITH ANESTHESIA Left 05/17/2016   Procedure: left renal ablation;  Surgeon: Aletta Edouard, MD;  Location: [facility] ORS;  Service: Radiology;  Laterality: Left;   Family History  Problem Relation Age of Onset  . Heart disease Father        deceased age 3  . Stroke Mother   . Alzheimer's disease Mother   . Heart attack Brother        deceased age 23  . Cancer Other        niece  . Colon cancer Neg Hx    Social History  Substance Use Topics  . Smoking status: Former Smoker    Packs/day: 0.10    Years: 2.00    Types: Cigarettes, Cigars    Quit date: 05/06/1970  . Smokeless tobacco: Never Used  . Alcohol use No    ROS:  General: Negative for anorexia, fevr, chills, fatigue, weakness.see hpi ENT: Negative for hoarseness, difficulty swallowing , nasal congestion. CV: Negative for chest pain, angina, palpitations, dyspnea on exertion, peripheral edema.  Respiratory: Negative for dyspnea at  rest, dyspnea on exertion, cough, sputum, wheezing.  GI: See history of present illness. GU:  Negative for dysuria, hematuria, urinary incontinence, urinary frequency, nocturnal urination.  Endo: see hpi   Physical Examination:   BP (!) 100/52   Pulse 64   Temp 97.8 F (36.6 C) (Oral)   Wt 118 lb 9.6 oz (53.8 kg)   BMI 18.03 kg/m   General: Well-nourished, well-developed somewhat thin in no acute distress.  Eyes: No icterus. Mouth: Oropharyngeal mucosa moist and pink , no lesions erythema or exudate. Lungs: Clear to auscultation bilaterally.  Heart: Regular rate and rhythm, no murmurs rubs or gallops.  Abdomen: Bowel sounds are normal, nontender, nondistended, no hepatosplenomegaly or masses, no abdominal bruits or hernia , no rebound or guarding.   Extremities: No lower extremity edema. No clubbing or deformities. Neuro: Alert and oriented x 4   Skin: Warm and dry, no jaundice.   Psych: Alert and cooperative, normal mood and affect.  Labs:  Lab Results  Component Value Date   TSH 1.304 01/02/2017   Lab Results  Component Value Date   CREATININE 0.86 01/03/2017   BUN 20 01/03/2017   NA 138 01/03/2017   K 3.9 01/03/2017   CL 111 01/03/2017   CO2 23 01/03/2017   Lab Results  Component Value Date   ALT 20 09/08/2015   AST 24 09/08/2015   ALKPHOS 76 09/08/2015   BILITOT 0.7 09/08/2015   Lab Results  Component Value Date   WBC 6.2 01/02/2017   HGB 10.8 (L) 01/02/2017   HCT 33.3 (L) 01/02/2017   HCT SPHEMO 01/02/2017   MCV 102.8 (H) 01/02/2017   PLT 182 01/02/2017   Lab Results  Component Value Date   ESRSEDRATE 2 01/02/2017   Lab Results  Component Value Date   [id number] 562 01/02/2017    No results found for: FOLATE  Imaging Studies: Dg Chest 2 View  Result Date: 01/01/2017 CLINICAL DATA:  Initial evaluation for acute chest pain. EXAM: CHEST  2 VIEW COMPARISON:  Prior radiograph  from 05/10/2016. FINDINGS: The cardiac and mediastinal silhouettes are  stable in size and contour, and remain within normal limits. Aortic atherosclerosis. The lungs are normally inflated. Chronic scarring within the bilateral perihilar regions, similar to previous. No airspace consolidation, pleural effusion, or pulmonary edema is identified. There is no pneumothorax. No acute osseous abnormality identified. IMPRESSION: 1. No active cardiopulmonary disease. 2. Stable chronic parenchymal scarring. Electronically Signed   By: Jeannine Boga M.D.   On: 01/01/2017 15:09   Ct Angio Chest Pe W/cm &/or Wo Cm  Result Date: 01/01/2017 CLINICAL DATA:  Recurrent chest pain positive D-dimer EXAM: CT ANGIOGRAPHY CHEST WITH CONTRAST TECHNIQUE: Multidetector CT imaging of the chest was performed using the standard protocol during bolus administration of intravenous contrast. Multiplanar CT image reconstructions and MIPs were obtained to evaluate the vascular anatomy. CONTRAST:  100 cc Isovue 370 intravenous COMPARISON:  Radiograph 01/01/2017, CT chest 06/30/2013, 10/01/2011 FINDINGS: Cardiovascular: Satisfactory opacification of the pulmonary arteries to the segmental level. No evidence of pulmonary embolism. Non aneurysmal aorta. Atherosclerotic calcifications. Coronary artery calcifications. No large pericardial effusion. Mediastinum/Nodes: Midline trachea. No thyroid mass. Calcified hilar and mediastinal lymph nodes. Esophagus within normal limits. Periesophageal calcified nodes as well. Lungs/Pleura: Stable parenchymal scarring within the hilar regions bilaterally. Stable left upper lobe subpleural pulmonary nodule. Similar appearance of mild fibrosis in the right upper lobe with scattered nodules. Negative for pneumothorax, pleural effusion or acute consolidation Upper Abdomen: Cyst in the left lobe of the liver. Calcified granuloma in the liver. Nonobstructing stone mid to upper pole left kidney Musculoskeletal: No chest wall abnormality. No acute or significant osseous findings.  Review of the MIP images confirms the above findings. IMPRESSION: 1. Negative for acute pulmonary embolus 2. Stable perihilar scarring and fibrosis in the right upper lobe with scattered pulmonary nodules. Calcified mediastinal lymph nodes as before 3. Negative for acute infiltrate, pneumothorax or pleural effusion 4. Nonobstructing stone in the left kidney Aortic Atherosclerosis (ICD10-I70.0). Electronically Signed   By: Donavan Foil M.D.   On: 01/01/2017 20:11   Nm Myocar Multi W/spect W/wall Motion / Ef  Result Date: 01/03/2017 CLINICAL DATA:  Chest pain. Prior angioplasty with stenting. Hypertension. Former smoker. EXAM: MYOCARDIAL IMAGING WITH SPECT (REST AND PHARMACOLOGIC-STRESS) GATED LEFT VENTRICULAR WALL MOTION STUDY LEFT VENTRICULAR EJECTION FRACTION TECHNIQUE: Standard myocardial SPECT imaging was performed after resting intravenous injection of 10 mCi Tc-57m tetrofosmin. Subsequently, intravenous infusion of Lexiscan was performed under the supervision of the Cardiology staff. At peak effect of the drug, 30 mCi Tc-67m tetrofosmin was injected intravenously and standard myocardial SPECT imaging was performed. Quantitative gated imaging was also performed to evaluate left ventricular wall motion, and estimate left ventricular ejection fraction. COMPARISON:  None. FINDINGS: Perfusion: No decreased activity in the left ventricle on stress imaging to suggest reversible ischemia or infarction. Wall Motion: Normal left ventricular wall motion. No left ventricular dilation. Left Ventricular Ejection Fraction: 82 % End diastolic volume 69 ml End systolic volume 12 ml IMPRESSION: 1. No reversible ischemia or infarction. 2. Normal left ventricular wall motion. 3. Left ventricular ejection fraction 82% 4. Non invasive risk stratification*: Low *2012 Appropriate Use Criteria for Coronary Revascularization Focused Update: J Am Coll Cardiol. 6045;40(9):811-914.  http://[email or web address]?[email or web address] Electronically Signed   By: Kerby Moors M.D.   On: 01/03/2017 13:08

## 2017-01-27 ENCOUNTER — Telehealth: Payer: Self-pay | Admitting: Internal Medicine

## 2017-01-27 DIAGNOSIS — R634 Abnormal weight loss: Secondary | ICD-10-CM | POA: Insufficient documentation

## 2017-01-27 NOTE — Assessment & Plan Note (Signed)
83 y/o male with h/o GERD who presents for refractory GERD, abnormal weight loss. Last EGD 2016, TCS 2008. CT with gallbladder ?adenomyomatosis, adrenal nodule, granulation/scarring at site of treated left renal neoplasm in 05/2016. Next surveillance imaging planned for 09/2017 per IR. Patient has lost 25 pounds in the past one year. Appetite is good takes PPI BID and TUMS.   Labs from recent hospitalization revealed macrocytic anemia without B12/folate deficiency.   To discuss work up with Dr. Gala Romney. Will consider EGD+/-colonoscopy for weight loss and evaluation of anemia.

## 2017-01-27 NOTE — Progress Notes (Signed)
CC'D TO PCP °

## 2017-01-28 DIAGNOSIS — L02234 Carbuncle of groin: Secondary | ICD-10-CM | POA: Diagnosis not present

## 2017-02-04 DIAGNOSIS — M542 Cervicalgia: Secondary | ICD-10-CM | POA: Diagnosis not present

## 2017-02-04 DIAGNOSIS — Z681 Body mass index (BMI) 19 or less, adult: Secondary | ICD-10-CM | POA: Diagnosis not present

## 2017-02-04 DIAGNOSIS — Z1389 Encounter for screening for other disorder: Secondary | ICD-10-CM | POA: Diagnosis not present

## 2017-02-05 DIAGNOSIS — L02234 Carbuncle of groin: Secondary | ICD-10-CM | POA: Diagnosis not present

## 2017-02-05 DIAGNOSIS — C642 Malignant neoplasm of left kidney, except renal pelvis: Secondary | ICD-10-CM | POA: Diagnosis not present

## 2017-02-10 ENCOUNTER — Telehealth: Payer: Self-pay | Admitting: Internal Medicine

## 2017-02-10 MED ORDER — PANTOPRAZOLE SODIUM 40 MG PO TBEC: 40.0000 mg | DELAYED_RELEASE_TABLET | Freq: Two times a day (BID) | ORAL | 2 refills | Status: DC

## 2017-02-10 NOTE — Telephone Encounter (Signed)
Pt needs his pantoprazole RX called into [facility] for a 180 pills. Any questions, call [phone]

## 2017-02-10 NOTE — Telephone Encounter (Signed)
Routing to the refill box. 

## 2017-02-10 NOTE — Addendum Note (Signed)
Addended by: Annitta Needs on: 02/10/2017 01:48 PM   Modules accepted: Orders

## 2017-02-10 NOTE — Telephone Encounter (Signed)
Done

## 2017-02-12 ENCOUNTER — Ambulatory Visit (INDEPENDENT_AMBULATORY_CARE_PROVIDER_SITE_OTHER): Payer: Medicare Other | Admitting: Cardiovascular Disease

## 2017-02-12 ENCOUNTER — Encounter: Payer: Self-pay | Admitting: Cardiovascular Disease

## 2017-02-12 VITALS — BP 122/60 | HR 58 | Ht 68.0 in | Wt 119.6 lb

## 2017-02-12 DIAGNOSIS — E785 Hyperlipidemia, unspecified: Secondary | ICD-10-CM | POA: Diagnosis not present

## 2017-02-12 DIAGNOSIS — I2510 Atherosclerotic heart disease of native coronary artery without angina pectoris: Secondary | ICD-10-CM | POA: Diagnosis not present

## 2017-02-12 DIAGNOSIS — I440 Atrioventricular block, first degree: Secondary | ICD-10-CM | POA: Diagnosis not present

## 2017-02-12 DIAGNOSIS — R0989 Other specified symptoms and signs involving the circulatory and respiratory systems: Secondary | ICD-10-CM

## 2017-02-12 DIAGNOSIS — R634 Abnormal weight loss: Secondary | ICD-10-CM | POA: Diagnosis not present

## 2017-02-12 NOTE — Patient Instructions (Signed)
Medication Instructions:  Your physician recommends that you continue on your current medications as directed. Please refer to the Current Medication list given to you today.  Testing/Procedures: Your physician has requested that you have a carotid duplex. This test is an ultrasound of the carotid arteries in your neck. It looks at blood flow through these arteries that supply the brain with blood. Allow one hour for this exam. There are no restrictions or special instructions.  Follow-Up: Your physician wants you to follow-up in: 6 MONTHS with [name].  You will receive a reminder letter in the mail two months in advance. If you don't receive a letter, please call our office to schedule the follow-up appointment.   Any Other Special Instructions Will Be Listed Below (If Applicable).     If you need a refill on your cardiac medications before your next appointment, please call your pharmacy.

## 2017-02-12 NOTE — Progress Notes (Signed)
Patient ID: Dale [name], male   DOB: 1928/05/21, 83 y.o.   MRN: [id number]    Primary M.D.: Dr. Redmond School  HPI: Dale [name] is a 83 y.o. male who presents to the office for a 67 month cardiology evaluation.  Dale [name] has known CAD and underwent initial intervention to his RCA in 2000. In 2009 a Cypher stent was placed beyond the acute margin of his RCA. His last cardiac catheterization was in September 2013 by Dr. [name] which showed 30% LAD narrowing after the second diagonal vessel, and normal left circumflex coronary artery, and patent RCA stents.  He has a history of hypothyroidism on Synthroid replacement and has a history of hyperlipidemia. He had been on on Crestor 5 mg plus Zetia 10 mg; however, due to recent increased cost of Zetia, this was discontinued and he has been on Crestor 20 mg daily.  He also has a history of GERD treated with Protonix, and he continues to be on dual antiplatelet therapy. He is responsive to Plavix on previous P2 Y12 testing.  When I saw him in 2016 he had experienced 3-4 episodes of chest pain over the six-month period.  Most of these episodes have occurred at night and were nitrate responsive.  He states that they were similar to his previous discomfort.  When I saw him, I further titrated his isosorbide mononitrate to 60 mg in the morning and 30 mg at night.   Since I last saw him September 2017, he continues to experience some occasional chest wall pain that is nonexertional.  He has had issues with weight loss.  He saw [name], [place] in August 2018 with weight loss, weakness, and vague chest pain.  He felt his chest pain was atypical and not ischemic.  An echo Doppler study on 12/23/2016 showed an EF of 55-60%.  He was hospitalized on August 29 through January 03, 2017 at  [facility].  Apparently he had a nuclear stress test which did not reveal any reversible ischemia.  He was also felt to have acute sinusitis for which he was treated with antibiotics.   He has a history of renal cell CA.Marland Kitchen  His chest pain has improved.  He presents for cardiac evaluation.  Past Medical History:  Diagnosis Date  . Allergic rhinitis   . Anal fissure   . Anginal pain ([place])   . Asthma   . Cancer ([place])    skin  . Chest pain   . CHF (congestive heart failure) ([place])   . Coronary heart disease    s/p stenting. cath in 01/2012 noncritical occlusion  . Dysrhythmia    1st degree heart block  . GERD (gastroesophageal reflux disease)   . Glaucoma   . Hiatal hernia   . Hyperlipidemia   . Hypertension   . Hypothyroidism   . Idiopathic thrombocytopenic purpura ([place]) 2002  . Macular degeneration   . Nephrolithiasis   . PUD (peptic ulcer disease)    remote  . Sarcoidosis    pulmonary  . Schatzki's ring     Past Surgical History:  Procedure Laterality Date  . cardiac stents    . COLONOSCOPY  10/30/2006   Normal rectum, sigmoid diverticula.Remainder of colonic mucosa appeared normal.  . CORONARY ANGIOPLASTY WITH STENT PLACEMENT     about 10 years ago per pt (around 2007)  . ESOPHAGOGASTRODUODENOSCOPY  06/19/2004   Two esophageal rings and esophageal web as described above.  All of these were disrupted by passing 56-French  Maloney dilator/ Candida esophagitis,which appears to be incidental given history of   antibiotic use, but nevertheless will be treated.  . ESOPHAGOGASTRODUODENOSCOPY  10/30/2006   Distal tandem esophageal ring status post dilation disruption as  described above.  Otherwise normal esophagus/  Small hiatal hernia otherwise normal stomach, D1 and D2  . ESOPHAGOGASTRODUODENOSCOPY N/A 03/22/2015   Dr.Rourk- noncritical schatzki's ring and hiatal hernia-o/w normal EGD.   Marland Kitchen ESOPHAGOGASTRODUODENOSCOPY (EGD) WITH ESOPHAGEAL DILATION  03/04/2012   RMR- schatzki's ring, hiatal hernia, polypoid gastric mucosa, bx= minimally active gastritis.  Everlean Alstrom GENERIC HISTORICAL  03/06/2016   IR RADIOLOGIST EVAL & MGMT 03/06/2016 Aletta Edouard, MD [facility] INTERV  RAD  . IR GENERIC HISTORICAL  06/18/2016   IR RADIOLOGIST EVAL & MGMT 06/18/2016 Aletta Edouard, MD [facility] INTERV RAD  . IR RADIOLOGIST EVAL & MGMT  10/01/2016  . LEFT HEART CATH N/A 02/02/2012   Procedure: LEFT HEART CATH;  Surgeon: Lorretta Harp, MD;  Location: [facility] CATH LAB;  Service: Cardiovascular;  Laterality: N/A;  . MEDIASTINOSCOPY     for dx sarcoid  . RADIOLOGY WITH ANESTHESIA Left 05/17/2016   Procedure: left renal ablation;  Surgeon: Aletta Edouard, MD;  Location: [facility] ORS;  Service: Radiology;  Laterality: Left;    Allergies  Allergen Reactions  . Azithromycin Other (See Comments)    Sore mouth and fever blisters around mouth, sores in nose area as well  . Doxazosin Shortness Of Breath  . Acetaminophen     REACTION: UNKNOWN REACTION  . Atenolol     REACTION: UNKNOWN REACTION  . Hydrocodone Nausea And Vomiting  . Levofloxacin Other (See Comments)    Caused stomach problems.  . Morphine     "made me crazy"  . Penicillins Nausea And Vomiting    Has patient had a PCN reaction causing immediate rash, facial/tongue/throat swelling, SOB or lightheadedness with hypotension: No Has patient had a PCN reaction causing severe rash involving mucus membranes or skin necrosis: No Has patient had a PCN reaction that required hospitalization No Has patient had a PCN reaction occurring within the last 10 years: No If all of the above answers are "NO", then may proceed with Cephalosporin use.   . Sulfonamide Derivatives Nausea And Vomiting    Current Outpatient Prescriptions  Medication Sig Dispense Refill  . acetaZOLAMIDE (DIAMOX) 250 MG tablet Take 250 mg by mouth 2 (two) times daily.    Marland Kitchen amLODipine (NORVASC) 5 MG tablet Take 1 tablet (5 mg total) by mouth daily. 90 tablet 3  . aspirin 81 MG tablet Take 81 mg by mouth. Every other day    . atorvastatin (LIPITOR) 40 MG tablet TAKE 1 TABLET BY MOUTH  DAILY (Patient taking differently: TAKE 1 TABLET BY MOUTH  EVENING) 90 tablet 2  .  clopidogrel (PLAVIX) 75 MG tablet TAKE 1 TABLET BY MOUTH  DAILY 90 tablet 3  . dorzolamide (TRUSOPT) 2 % ophthalmic solution Place 1 drop into both eyes 2 (two) times daily.     . finasteride (PROSCAR) 5 MG tablet Take 5 mg by mouth daily. AT NOON    . guaiFENesin (MUCINEX) 600 MG 12 hr tablet Take 1,200 mg by mouth daily.     . hydrochlorothiazide (HYDRODIURIL) 12.5 MG tablet TAKE 1 TABLET BY MOUTH ONCE A DAY AS NEEDED FOR FLUID  RETENTION 90 tablet 2  . isosorbide mononitrate (IMDUR) 60 MG 24 hr tablet TAKE 1 TABLET BY MOUTH IN  THE MORNING AND 1/2 TABLET  AT NIGHT (Patient taking differently:  TAKE 60 MG EVERY MORNING AND 30 MG EVERY NIGHT) 135 tablet 3  . latanoprost (XALATAN) 0.005 % ophthalmic solution Place 1 drop into both eyes at bedtime.    Marland Kitchen levothyroxine (SYNTHROID, LEVOTHROID) 100 MCG tablet TAKE 1 TABLET BY MOUTH  DAILY (Patient taking differently: TAKE 100 MCG BY MOUTH EVERY EVENING) 90 tablet 3  . losartan (COZAAR) 100 MG tablet Take 1 tablet (100 mg total) by mouth daily. 15 tablet 0  . metoprolol tartrate (LOPRESSOR) 25 MG tablet TAKE ONE-HALF TABLET BY  MOUTH TWICE A DAY (Patient taking differently: TAKE 12.5MG BY MOUTH TWICE DAILY) 90 tablet 3  . mometasone (ELOCON) 0.1 % ointment Apply topically daily.    . Multiple Vitamin (MULTIVITAMIN) tablet Take 1 tablet by mouth every evening.     . Multiple Vitamins-Minerals (PRESERVISION/LUTEIN) CAPS Take 1 capsule by mouth 2 (two) times daily.     . nitroGLYCERIN (NITROSTAT) 0.4 MG SL tablet Place 1 tablet (0.4 mg total) under the tongue every 5 (five) minutes as needed. For chest pain 25 tablet 3  . ondansetron (ZOFRAN ODT) 4 MG disintegrating tablet 39m ODT q4 hours prn nausea/vomit (Patient taking differently: Take 4 mg by mouth every 4 (four) hours as needed for nausea or vomiting. ) 4 tablet 0  . pantoprazole (PROTONIX) 40 MG tablet Take 1 tablet (40 mg total) by mouth 2 (two) times daily before a meal. 180 tablet 2  . potassium  chloride (K-DUR,KLOR-CON) 10 MEQ tablet TAKE 1 TABLET BY MOUTH  DAILY 90 tablet 1  . timolol (TIMOPTIC) 0.5 % ophthalmic solution Place 1 drop into both eyes daily.     .Marland Kitchentriamcinolone (KENALOG) 0.1 % cream Apply 1 application topically 2 (two) times daily as needed (irritation).     . vitamin C (ASCORBIC ACID) 500 MG tablet Take 500 mg by mouth daily.     . vitamin E 200 UNIT capsule Take 200 Units by mouth daily. AT NOON     No current facility-administered medications for this visit.     Social History   Social History  . Marital status: Married    Spouse name: N/A  . Number of children: 1  . Years of education: N/A   Occupational History  . Retired     [employer]  .  Retired   Social History Main Topics  . Smoking status: Former Smoker    Packs/day: 0.10    Years: 2.00    Types: Cigarettes, Cigars    Quit date: 05/06/1970  . Smokeless tobacco: Never Used  . Alcohol use No  . Drug use: No  . Sexual activity: No   Other Topics Concern  . Not on file   Social History Narrative  . No narrative on file   Additional social history is notable that he is married and lives with his wife. He quit smoking over 40 years ago. Has one child. He remains active.  Family History  Problem Relation Age of Onset  . Heart disease Father        deceased age 81 . Stroke Mother   . Alzheimer's disease Mother   . Heart attack Brother        deceased age 81 . Cancer Other        niece  . Colon cancer Neg Hx    ROS General: Negative; No fevers, chills, or night sweats; Positive for weight loss HEENT: Positive for recent sinusitis.  No changes in vision., difficulty swallowing Pulmonary:  Negative; No cough, wheezing, shortness of breath, hemoptysis Cardiovascular: See history of present illness GI: Negative; No nausea, vomiting, diarrhea, or abdominal pain GU: History of renal cell CA Musculoskeletal: Negative; no myalgias, joint pain, or  weakness Hematologic/Oncology: Negative; no easy bruising, bleeding Endocrine: Positive for hypothyroidism; no diabetes Neuro: Negative; no changes in balance, headaches Skin: Negative; No rashes or skin lesions Psychiatric: Negative; No behavioral problems, depression Sleep: Negative; No snoring, daytime sleepiness, hypersomnolence, bruxism, restless legs, hypnogognic hallucinations, no cataplexy Other comprehensive 14 point system review is negative.   PE BP 122/60   Pulse (!) 58   Ht _0  (1.727 m)   Wt 119 lb 9.6 oz (54.3 kg)   BMI 18.19 kg/m    Repeat blood pressure 120/64  Wt Readings from Last 3 Encounters:  02/12/17 119 lb 9.6 oz (54.3 kg)  01/22/17 118 lb 9.6 oz (53.8 kg)  01/03/17 116 lb 8 oz (52.8 kg)   General: Alert, oriented, no distress.  Thin; BMI 18 Skin: normal turgor, no rashes, warm and dry HEENT: Normocephalic, atraumatic. Pupils equal round and reactive to light; sclera anicteric; extraocular muscles intact;  Nose without nasal septal hypertrophy Mouth/Parynx benign; Mallinpatti scale 2 Neck: No JVD, left carotid bruit normal carotid upstroke Lungs: clear to ausculatation and percussion; no wheezing or rales Chest wall: without tenderness to palpitation Heart: PMI not displaced, RRR, s1 s2 normal, 1/6 systolic murmur, no diastolic murmur, no rubs, gallops, thrills, or heaves Abdomen: soft, nontender; no hepatosplenomehaly, BS+; abdominal aorta nontender and not dilated by palpation. Back: no CVA tenderness Pulses 2+ Musculoskeletal: full range of motion, normal strength, no joint deformities Extremities: Resolution of prior ankle edema no clubbing, cyanosis,  Homan's sign negative  Neurologic: grossly nonfocal; Cranial nerves grossly wnl Psychologic: Normal mood and affect   ECG (independently read by me): Sinus bradycardia 50 bpm.  First degree AV block with a PR interval at 224 ms.  No syncope ST changes.  September 2017 ECG (independently read by  me): Sinus bradycardia 59 bpm.  First-degree AV block with PR interval 240 ms.  No significant ST-T changes.  November 2016 ECG (independently read by me): Sinus bradycardia at 55 beats per minute with first-degree AV block.  PR interval 246 ms.  No significant ST-T changes.  May 2016 ECG (independently read by me): Sinus rhythm with first-degree heart block.  PR interval 226.  No ST segment changes.  February 2016 ECG (independently read by me): Sinus rhythm at 65 bpm.  Mild first-degree AV block with a PR interval 228 ms.  No significant ST segment changes.  December 2015 ECG (independently read by me): Sinus bradycardia 57 bpm.  Borderline first-degree AV block with a PR interval 216 daily.  Seconds.  QTc interval is normal at 373 ms.  June 2015 ECG (independently read by me): Sinus bradycardia 54 beats per minute.  First degree block with a PR interval of 232 ms.  No significant ST-T changes.  Prior ECG: Sinus bradycardia 54 beats per minute. PR interval 206 ms. QTc interval normal  LABS: BMP Latest Ref Rng & Units 01/03/2017 01/02/2017 01/01/2017  Glucose 65 - 99 mg/dL 98 112(H) 109(H)  BUN 6 - 20 mg/dL _1 Creatinine 0.61 - 1.24 mg/dL 0.86 0.71 0.81  BUN/Creat Ratio 10 - 24 - - -  Sodium 135 - 145 mmol/L 138 139 138  Potassium 3.5 - 5.1 mmol/L 3.9 3.3(L) 3.4(L)  Chloride 101 - 111 mmol/L 111 110 111  CO2 22 -  32 mmol/L 23 23 21(L)  Calcium 8.9 - 10.3 mg/dL 9.0 8.7(L) 9.2   Hepatic Function Latest Ref Rng & Units 09/08/2015 07/07/2014 05/11/2013  Total Protein 6.0 - 8.5 g/dL 6.3 6.5 6.7  Albumin 3.5 - 4.7 g/dL 3.9 4.2 3.6  AST 0 - 40 IU/L _0 ALT 0 - 44 IU/L _1 Alk Phosphatase 39 - 117 IU/L 76 73 74  Total Bilirubin 0.0 - 1.2 mg/dL 0.7 0.4 0.4  Bilirubin, Direct - - - -   CBC Latest Ref Rng & Units 01/02/2017 01/02/2017 01/01/2017  WBC 4.0 - 10.5 K/uL 6.2 - 5.8  Hemoglobin 13.0 - 17.0 g/dL 10.8(L) - 11.2(L)  Hematocrit % 33.3(L) SPHEMO 34.6(L)  Platelets 150 - 400  K/uL 182 - 169   Lab Results  Component Value Date   MCV 102.8 (H) 01/02/2017   MCV 103.9 (H) 01/01/2017   MCV 97.2 05/18/2016   Lab Results  Component Value Date   TSH 1.304 01/02/2017  No results found for: HGBA1C   Lipid Panel     Component Value Date/Time   CHOL 98 01/02/2017 0612   CHOL 127 09/08/2015 0933   TRIG 48 01/02/2017 0612   HDL 37 (L) 01/02/2017 0612   HDL 48 09/08/2015 0933   CHOLHDL 2.6 01/02/2017 0612   VLDL 10 01/02/2017 0612   LDLCALC 51 01/02/2017 0612   LDLCALC 61 09/08/2015 0933   RADIOLOGY: No results found.  IMPRESSION:  1. Coronary artery disease involving native coronary artery of native heart without angina pectoris   2. Carotid bruit, unspecified laterality   3. Weight loss   4. First degree AV block   5. Hyperlipidemia, unspecified hyperlipidemia type     ASSESSMENT AND PLAN: Mr. Alan Drummer is an 83 years old gentleman who is  status post initial intervention to his RCA in 2000 and subsequent intervention in 2009. At last catheterization in 2013 his RCA stents were patent and he had mild 30% LAD narrowing.  Presently, his blood pressure is controlled on amlodipine 5 mg, HCTZ 12.5 mg, metoprolol 25 mg twice a day, and losartan 100 mg daily.  He also is still on isosorbide.  He is not having any ischemic chest pain symptoms.  I reviewed his most recent nuclear perfusion study which was done on 01/02/2017 and was low risk without reversible ischemia or infarction.  I reviewed his echo Doppler data.  He has maintaining sinus rhythm and is bradycardic with first-degree AV block, which is stable.  He is not orthostatic.  He has a left carotid bruit on exam.  I will schedule her for carotid duplex imaging.  He is on atorvastatin for his CAD with target LDL less than 70.  Most recent lipid panel revealed an LDL of 51.  He recently completed antibiotics for sinus infection.  His GERD symptoms are controlled on Protonix.  His remote history of no cell  carcinoma.  Recommend follow-up with his PCP and oncologist, particularly with his weight loss.  Time spent: 25 minutes  Troy Sine, MD, [facility]  02/14/2017 6:26 PM

## 2017-02-19 DIAGNOSIS — H04123 Dry eye syndrome of bilateral lacrimal glands: Secondary | ICD-10-CM | POA: Diagnosis not present

## 2017-02-19 DIAGNOSIS — Z961 Presence of intraocular lens: Secondary | ICD-10-CM | POA: Diagnosis not present

## 2017-02-19 DIAGNOSIS — H01021 Squamous blepharitis right upper eyelid: Secondary | ICD-10-CM | POA: Diagnosis not present

## 2017-02-19 DIAGNOSIS — H401131 Primary open-angle glaucoma, bilateral, mild stage: Secondary | ICD-10-CM | POA: Diagnosis not present

## 2017-02-19 DIAGNOSIS — H01024 Squamous blepharitis left upper eyelid: Secondary | ICD-10-CM | POA: Diagnosis not present

## 2017-02-19 DIAGNOSIS — H01025 Squamous blepharitis left lower eyelid: Secondary | ICD-10-CM | POA: Diagnosis not present

## 2017-02-19 DIAGNOSIS — H01022 Squamous blepharitis right lower eyelid: Secondary | ICD-10-CM | POA: Diagnosis not present

## 2017-02-27 ENCOUNTER — Ambulatory Visit (HOSPITAL_COMMUNITY)
Admission: RE | Admit: 2017-02-27 | Discharge: 2017-02-27 | Disposition: A | Discharge status: Home / Self Care | Payer: Medicare Other | Source: Ambulatory Visit | Attending: Cardiovascular Disease | Admitting: Cardiovascular Disease

## 2017-02-27 DIAGNOSIS — I10 Essential (primary) hypertension: Secondary | ICD-10-CM | POA: Insufficient documentation

## 2017-02-27 DIAGNOSIS — E785 Hyperlipidemia, unspecified: Secondary | ICD-10-CM | POA: Insufficient documentation

## 2017-02-27 DIAGNOSIS — Z87891 Personal history of nicotine dependence: Secondary | ICD-10-CM | POA: Diagnosis not present

## 2017-02-27 DIAGNOSIS — I6523 Occlusion and stenosis of bilateral carotid arteries: Secondary | ICD-10-CM | POA: Diagnosis not present

## 2017-02-27 DIAGNOSIS — R0989 Other specified symptoms and signs involving the circulatory and respiratory systems: Secondary | ICD-10-CM | POA: Diagnosis not present

## 2017-03-20 ENCOUNTER — Other Ambulatory Visit: Payer: Self-pay | Admitting: Cardiovascular Disease

## 2017-03-21 ENCOUNTER — Other Ambulatory Visit: Payer: Self-pay | Admitting: Cardiovascular Disease

## 2017-03-24 DIAGNOSIS — L570 Actinic keratosis: Secondary | ICD-10-CM | POA: Diagnosis not present

## 2017-03-24 DIAGNOSIS — D225 Melanocytic nevi of trunk: Secondary | ICD-10-CM | POA: Diagnosis not present

## 2017-03-24 DIAGNOSIS — X32XXXD Exposure to sunlight, subsequent encounter: Secondary | ICD-10-CM | POA: Diagnosis not present

## 2017-03-24 DIAGNOSIS — D0439 Carcinoma in situ of skin of other parts of face: Secondary | ICD-10-CM | POA: Diagnosis not present

## 2017-03-24 DIAGNOSIS — L820 Inflamed seborrheic keratosis: Secondary | ICD-10-CM | POA: Diagnosis not present

## 2017-03-31 ENCOUNTER — Other Ambulatory Visit: Payer: Self-pay | Admitting: *Deleted

## 2017-03-31 MED ORDER — CLOPIDOGREL BISULFATE 75 MG PO TABS: 75.0000 mg | ORAL_TABLET | Freq: Every day | ORAL | 3 refills | Status: DC

## 2017-04-07 ENCOUNTER — Emergency Department (HOSPITAL_COMMUNITY): Payer: No Typology Code available for payment source

## 2017-04-07 ENCOUNTER — Other Ambulatory Visit: Payer: Self-pay

## 2017-04-07 ENCOUNTER — Ambulatory Visit (INDEPENDENT_AMBULATORY_CARE_PROVIDER_SITE_OTHER): Payer: Medicare Other | Admitting: Otolaryngology

## 2017-04-07 ENCOUNTER — Emergency Department (HOSPITAL_COMMUNITY)
Admission: EM | Admit: 2017-04-07 | Discharge: 2017-04-07 | Disposition: A | Discharge status: Home / Self Care | Payer: No Typology Code available for payment source | Attending: Emergency Medicine | Admitting: Emergency Medicine

## 2017-04-07 ENCOUNTER — Encounter (HOSPITAL_COMMUNITY): Payer: Self-pay | Admitting: Emergency Medicine

## 2017-04-07 DIAGNOSIS — Y9389 Activity, other specified: Secondary | ICD-10-CM | POA: Insufficient documentation

## 2017-04-07 DIAGNOSIS — Y92410 Unspecified street and highway as the place of occurrence of the external cause: Secondary | ICD-10-CM | POA: Diagnosis not present

## 2017-04-07 DIAGNOSIS — Z87891 Personal history of nicotine dependence: Secondary | ICD-10-CM | POA: Insufficient documentation

## 2017-04-07 DIAGNOSIS — I2510 Atherosclerotic heart disease of native coronary artery without angina pectoris: Secondary | ICD-10-CM | POA: Diagnosis not present

## 2017-04-07 DIAGNOSIS — S1093XA Contusion of unspecified part of neck, initial encounter: Secondary | ICD-10-CM | POA: Diagnosis not present

## 2017-04-07 DIAGNOSIS — S20212A Contusion of left front wall of thorax, initial encounter: Secondary | ICD-10-CM

## 2017-04-07 DIAGNOSIS — Z79899 Other long term (current) drug therapy: Secondary | ICD-10-CM | POA: Diagnosis not present

## 2017-04-07 DIAGNOSIS — Z7982 Long term (current) use of aspirin: Secondary | ICD-10-CM | POA: Diagnosis not present

## 2017-04-07 DIAGNOSIS — J45909 Unspecified asthma, uncomplicated: Secondary | ICD-10-CM | POA: Insufficient documentation

## 2017-04-07 DIAGNOSIS — R51 Headache: Secondary | ICD-10-CM | POA: Insufficient documentation

## 2017-04-07 DIAGNOSIS — Y999 Unspecified external cause status: Secondary | ICD-10-CM | POA: Insufficient documentation

## 2017-04-07 DIAGNOSIS — Z23 Encounter for immunization: Secondary | ICD-10-CM | POA: Diagnosis not present

## 2017-04-07 DIAGNOSIS — I509 Heart failure, unspecified: Secondary | ICD-10-CM | POA: Insufficient documentation

## 2017-04-07 DIAGNOSIS — M542 Cervicalgia: Secondary | ICD-10-CM | POA: Diagnosis not present

## 2017-04-07 DIAGNOSIS — R102 Pelvic and perineal pain: Secondary | ICD-10-CM | POA: Diagnosis not present

## 2017-04-07 DIAGNOSIS — I110 Hypertensive heart disease with heart failure: Secondary | ICD-10-CM | POA: Insufficient documentation

## 2017-04-07 DIAGNOSIS — S21112A Laceration without foreign body of left front wall of thorax without penetration into thoracic cavity, initial encounter: Secondary | ICD-10-CM | POA: Diagnosis not present

## 2017-04-07 DIAGNOSIS — Z955 Presence of coronary angioplasty implant and graft: Secondary | ICD-10-CM | POA: Diagnosis not present

## 2017-04-07 DIAGNOSIS — S199XXA Unspecified injury of neck, initial encounter: Secondary | ICD-10-CM | POA: Diagnosis not present

## 2017-04-07 DIAGNOSIS — S0990XA Unspecified injury of head, initial encounter: Secondary | ICD-10-CM | POA: Diagnosis not present

## 2017-04-07 DIAGNOSIS — R079 Chest pain, unspecified: Secondary | ICD-10-CM | POA: Diagnosis not present

## 2017-04-07 DIAGNOSIS — E039 Hypothyroidism, unspecified: Secondary | ICD-10-CM | POA: Diagnosis not present

## 2017-04-07 DIAGNOSIS — S1083XA Contusion of other specified part of neck, initial encounter: Secondary | ICD-10-CM | POA: Diagnosis not present

## 2017-04-07 LAB — BASIC METABOLIC PANEL
ANION GAP: 6 (ref 5–15)
BUN: 25 mg/dL — ABNORMAL HIGH (ref 6–20)
CHLORIDE: 107 mmol/L (ref 101–111)
CO2: 22 mmol/L (ref 22–32)
Calcium: 8.9 mg/dL (ref 8.9–10.3)
Creatinine, Ser: 0.68 mg/dL (ref 0.61–1.24)
GFR calc non Af Amer: >60 mL/min (ref >60)
Glucose, Bld: 114 mg/dL — ABNORMAL HIGH (ref 65–99)
POTASSIUM: 3.4 mmol/L — AB (ref 3.5–5.1)
SODIUM: 135 mmol/L (ref 135–145)

## 2017-04-07 LAB — URINALYSIS, ROUTINE W REFLEX MICROSCOPIC
Bilirubin Urine: NEGATIVE
Glucose, UA: NEGATIVE mg/dL
Hgb urine dipstick: NEGATIVE
Ketones, ur: NEGATIVE mg/dL
LEUKOCYTES UA: NEGATIVE
NITRITE: NEGATIVE
PH: 6 (ref 5.0–8.0)
Protein, ur: NEGATIVE mg/dL
SPECIFIC GRAVITY, URINE: 1.015 (ref 1.005–1.030)

## 2017-04-07 LAB — CBC WITH DIFFERENTIAL/PLATELET
BASOS PCT: 1 %
Basophils Absolute: 0 10*3/uL (ref 0.0–0.1)
EOS ABS: 0.5 10*3/uL (ref 0.0–0.7)
Eosinophils Relative: 9 %
HEMATOCRIT: 35.2 % — AB (ref 39.0–52.0)
HEMOGLOBIN: 11.4 g/dL — AB (ref 13.0–17.0)
Lymphocytes Relative: 15 %
Lymphs Abs: 0.8 10*3/uL (ref 0.7–4.0)
MCH: 34.5 pg — ABNORMAL HIGH (ref 26.0–34.0)
MCHC: 32.4 g/dL (ref 30.0–36.0)
MCV: 106.7 fL — ABNORMAL HIGH (ref 78.0–100.0)
MONOS PCT: 12 %
Monocytes Absolute: 0.7 10*3/uL (ref 0.1–1.0)
Neutro Abs: 3.5 10*3/uL (ref 1.7–7.7)
Neutrophils Relative %: 63 %
Platelets: 195 10*3/uL (ref 150–400)
RBC: 3.3 MIL/uL — AB (ref 4.22–5.81)
RDW: 14.4 % (ref 11.5–15.5)
WBC: 5.5 10*3/uL (ref 4.0–10.5)

## 2017-04-07 MED ORDER — TRAMADOL HCL 50 MG PO TABS: 50.0000 mg | ORAL_TABLET | Freq: Four times a day (QID) | ORAL | 0 refills | Status: DC | PRN

## 2017-04-07 MED ORDER — ACETAMINOPHEN 500 MG PO TABS
1000.0000 mg | ORAL_TABLET | Freq: Once | ORAL | Status: AC
  Administered 2017-04-07: 1000 mg via ORAL
  Filled 2017-04-07: qty 2

## 2017-04-07 MED ORDER — SILVER NITRATE-POT NITRATE 75-25 % EX MISC
1.0000 "application " | Freq: Once | CUTANEOUS | Status: AC
  Administered 2017-04-07: 1 via TOPICAL
  Filled 2017-04-07: qty 2

## 2017-04-07 MED ORDER — TETANUS-DIPHTH-ACELL PERTUSSIS 5-2.5-18.5 LF-MCG/0.5 IM SUSP
0.5000 mL | Freq: Once | INTRAMUSCULAR | Status: AC
  Administered 2017-04-07: 0.5 mL via INTRAMUSCULAR
  Filled 2017-04-07: qty 0.5

## 2017-04-07 MED ORDER — BACITRACIN ZINC 500 UNIT/GM EX OINT
TOPICAL_OINTMENT | CUTANEOUS | Status: AC
  Administered 2017-04-07: 16:00:00
  Filled 2017-04-07: qty 3.6

## 2017-04-07 MED ORDER — IOPAMIDOL (ISOVUE-300) INJECTION 61%
75.0000 mL | Freq: Once | INTRAVENOUS | Status: AC | PRN
  Administered 2017-04-07: 75 mL via INTRAVENOUS
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
## 2017-04-07 NOTE — ED Notes (Signed)
Pt transported to xray 

## 2017-04-08 DIAGNOSIS — Z681 Body mass index (BMI) 19 or less, adult: Secondary | ICD-10-CM | POA: Diagnosis not present

## 2017-04-08 DIAGNOSIS — T148XXA Other injury of unspecified body region, initial encounter: Secondary | ICD-10-CM | POA: Diagnosis not present

## 2017-04-08 DIAGNOSIS — D692 Other nonthrombocytopenic purpura: Secondary | ICD-10-CM | POA: Diagnosis not present

## 2017-04-10 DIAGNOSIS — T148XXA Other injury of unspecified body region, initial encounter: Secondary | ICD-10-CM | POA: Diagnosis not present

## 2017-04-10 DIAGNOSIS — D692 Other nonthrombocytopenic purpura: Secondary | ICD-10-CM | POA: Diagnosis not present

## 2017-04-10 DIAGNOSIS — Z681 Body mass index (BMI) 19 or less, adult: Secondary | ICD-10-CM | POA: Diagnosis not present

## 2017-04-16 ENCOUNTER — Telehealth: Payer: Self-pay | Admitting: Gastroenterology

## 2017-04-16 NOTE — Telephone Encounter (Signed)
Please schedule f/u within 2 weeks per [facility]

## 2017-04-16 NOTE — Telephone Encounter (Signed)
Received labs dated 12/2016 Folate 47 normal, H&H to hemolyzed for analysis.  Labs from June 2018 TSH 0.705, cortisol 10.5, PSA 0.2, white blood cell count 4400, hemoglobin 12.3 slightly low, MCV 98 slightly high, platelets 203,000, creatinine 0.82, total bilirubin 0.6, alkaline phosphatase 89, AST 7 albumin 4.2.  Please ask patient to come for f/u anemia and weight loss. Sorry for delay in receiving prior records. Need to reevaluate to come up with next step. Please provide ov within 2 weeks, may use urgent.

## 2017-04-17 NOTE — Telephone Encounter (Signed)
PATIENT SCHEDULED  °

## 2017-04-21 DIAGNOSIS — C44311 Basal cell carcinoma of skin of nose: Secondary | ICD-10-CM | POA: Diagnosis not present

## 2017-04-21 DIAGNOSIS — X32XXXD Exposure to sunlight, subsequent encounter: Secondary | ICD-10-CM | POA: Diagnosis not present

## 2017-04-21 DIAGNOSIS — Z85828 Personal history of other malignant neoplasm of skin: Secondary | ICD-10-CM | POA: Diagnosis not present

## 2017-04-21 DIAGNOSIS — L570 Actinic keratosis: Secondary | ICD-10-CM | POA: Diagnosis not present

## 2017-04-21 DIAGNOSIS — D225 Melanocytic nevi of trunk: Secondary | ICD-10-CM | POA: Diagnosis not present

## 2017-04-21 DIAGNOSIS — D0439 Carcinoma in situ of skin of other parts of face: Secondary | ICD-10-CM | POA: Diagnosis not present

## 2017-04-21 DIAGNOSIS — Z08 Encounter for follow-up examination after completed treatment for malignant neoplasm: Secondary | ICD-10-CM | POA: Diagnosis not present

## 2017-05-10 ENCOUNTER — Other Ambulatory Visit: Payer: Self-pay | Admitting: Cardiovascular Disease

## 2017-05-15 ENCOUNTER — Ambulatory Visit: Payer: Medicare Other | Admitting: Gastroenterology

## 2017-05-19 ENCOUNTER — Other Ambulatory Visit: Payer: Self-pay

## 2017-05-19 MED ORDER — LEVOTHYROXINE SODIUM 100 MCG PO TABS: 100.0000 ug | ORAL_TABLET | Freq: Every day | ORAL | 0 refills | Status: DC

## 2017-05-19 NOTE — Telephone Encounter (Signed)
Rx(s) sent to pharmacy electronically.  

## 2017-05-21 ENCOUNTER — Ambulatory Visit (INDEPENDENT_AMBULATORY_CARE_PROVIDER_SITE_OTHER): Payer: Medicare Other | Admitting: Gastroenterology

## 2017-05-21 ENCOUNTER — Encounter: Payer: Self-pay | Admitting: Gastroenterology

## 2017-05-21 VITALS — BP 107/64 | HR 63 | Temp 96.9°F | Ht 68.0 in | Wt 120.8 lb

## 2017-05-21 DIAGNOSIS — K219 Gastro-esophageal reflux disease without esophagitis: Secondary | ICD-10-CM | POA: Diagnosis not present

## 2017-05-21 DIAGNOSIS — D638 Anemia in other chronic diseases classified elsewhere: Secondary | ICD-10-CM | POA: Insufficient documentation

## 2017-05-21 DIAGNOSIS — D649 Anemia, unspecified: Secondary | ICD-10-CM | POA: Diagnosis not present

## 2017-05-21 DIAGNOSIS — R634 Abnormal weight loss: Secondary | ICD-10-CM | POA: Diagnosis not present

## 2017-05-21 NOTE — Progress Notes (Signed)
Primary Care Physician: Redmond School, MD  Primary Gastroenterologist:  Garfield Cornea, MD   Chief Complaint  Patient presents with  . Anemia    f/u  . Weight Loss  . Gastroesophageal Reflux    not as bad    HPI: Dale [name] is a 84 y.o. male here for follow-up.  He was seen back in September for refractory GERD, abnormal weight loss..  He has a history of GERD/dysphagia.  When I last saw him he lost about 25 pounds in 1 year.  Had also been diagnosed with left renal cell carcinoma and underwent cryoablation.  Labs in August 2018 indicated hemoglobin of 10.8, MCV 102.8, normal B12/folate.  CT abdomen with and without contrast in May 2018 with?  Gallbladder adenomyomatosis, 9 mm posterior left adrenal nodule, granulation/scarring at the site of treated left renal neoplasm.  States his reflux is about 75% better than when we last saw him.  Stopped eating pinto beans at [place] which helped the most.  He is gained a couple more pounds.  Still about 13 pounds shy from where he was in early January 2018.  Has been able to cut way back on Tums denies any abdominal pain.  Bowel movements are regular.  No blood in the stool or melena.  No dysphagia.  States he has seen some possible blood in his urine about 1-2 times per week.  Has a appointment with his urologist tomorrow.  Approximately 1 month ago his hemoglobin was slightly 10.8 up to 11.4.  MCV increased at 106.7.  August 2018 his folate and vitamin B12 are both normal.   Current Outpatient Medications  Medication Sig Dispense Refill  . acetaZOLAMIDE (DIAMOX) 250 MG tablet Take 250 mg by mouth 2 (two) times daily.    Marland Kitchen amLODipine (NORVASC) 5 MG tablet Take 1 tablet (5 mg total) by mouth daily. 90 tablet 3  . aspirin 81 MG tablet Take 81 mg by mouth every other day. Every other day    . atorvastatin (LIPITOR) 40 MG tablet Take one (1) tablet (40 mg) by mouth every evening. 90 tablet 3  . clopidogrel (PLAVIX) 75 MG tablet Take 1  tablet (75 mg total) by mouth daily. 90 tablet 3  . dorzolamide (TRUSOPT) 2 % ophthalmic solution Place 1 drop into both eyes 2 (two) times daily.     . finasteride (PROSCAR) 5 MG tablet Take 5 mg by mouth daily. AT NOON    . guaiFENesin (MUCINEX) 600 MG 12 hr tablet Take 1,200 mg by mouth daily.     . hydrochlorothiazide (HYDRODIURIL) 12.5 MG tablet TAKE 1 TABLET BY MOUTH ONCE A DAY AS NEEDED FOR FLUID  RETENTION (Patient taking differently: TAKE 1 TABLET BY MOUTH ONCE A DAY AS NEEDED FOR FLUID  RETENTION. Taking daily) 90 tablet 3  . isosorbide mononitrate (IMDUR) 60 MG 24 hr tablet TAKE 1 TABLET BY MOUTH IN  THE MORNING AND 1/2 TABLET  AT NIGHT 135 tablet 0  . latanoprost (XALATAN) 0.005 % ophthalmic solution Place 1 drop into both eyes at bedtime.    Marland Kitchen levothyroxine (SYNTHROID, LEVOTHROID) 100 MCG tablet Take 1 tablet (100 mcg total) by mouth daily. 90 tablet 0  . losartan (COZAAR) 100 MG tablet TAKE 1 TABLET BY MOUTH  DAILY 90 tablet 3  . metoprolol tartrate (LOPRESSOR) 25 MG tablet TAKE ONE-HALF TABLET BY  MOUTH TWICE A DAY 90 tablet 0  . mometasone (ELOCON) 0.1 % ointment Apply topically daily.    Marland Kitchen  Multiple Vitamin (MULTIVITAMIN) tablet Take 1 tablet by mouth every evening.     . Multiple Vitamins-Minerals (PRESERVISION/LUTEIN) CAPS Take 1 capsule by mouth 2 (two) times daily.     . nitroGLYCERIN (NITROSTAT) 0.4 MG SL tablet Place 1 tablet (0.4 mg total) under the tongue every 5 (five) minutes as needed. For chest pain 25 tablet 3  . ondansetron (ZOFRAN ODT) 4 MG disintegrating tablet 4mg  ODT q4 hours prn nausea/vomit (Patient taking differently: Take 4 mg by mouth every 4 (four) hours as needed for nausea or vomiting. ) 4 tablet 0  . pantoprazole (PROTONIX) 40 MG tablet Take 1 tablet (40 mg total) by mouth 2 (two) times daily before a meal. 180 tablet 2  . potassium chloride (K-DUR,KLOR-CON) 10 MEQ tablet TAKE 1 TABLET BY MOUTH  DAILY 90 tablet 1  . timolol (TIMOPTIC) 0.5 % ophthalmic  solution Place 1 drop into both eyes daily.     Marland Kitchen triamcinolone (KENALOG) 0.1 % cream Apply 1 application topically 2 (two) times daily as needed (irritation).     . vitamin C (ASCORBIC ACID) 500 MG tablet Take 500 mg by mouth daily.     . vitamin E 200 UNIT capsule Take 200 Units by mouth daily. AT NOON     No current facility-administered medications for this visit.     Allergies as of 05/21/2017 - Review Complete 05/21/2017  Allergen Reaction Noted  . Azithromycin Other (See Comments) 06/03/2013  . Doxazosin Shortness Of Breath 02/11/2012  . Acetaminophen    . Atenolol    . Hydrocodone Nausea And Vomiting 08/15/2011  . Levofloxacin Other (See Comments) 09/26/2014  . Morphine    . Penicillins Nausea And Vomiting   . Sulfonamide derivatives Nausea And Vomiting     ROS:  General: Negative for anorexia, weight loss, fever, chills, fatigue, weakness. ENT: Negative for hoarseness, difficulty swallowing , nasal congestion. CV: Negative for chest pain, angina, palpitations, dyspnea on exertion, peripheral edema.  Respiratory: Negative for dyspnea at rest, dyspnea on exertion, cough, sputum, wheezing.  GI: See history of present illness. GU:  Negative for dysuria, hematuria, urinary incontinence, urinary frequency, nocturnal urination.  Endo: Negative for unusual weight change.    Physical Examination:   BP 107/64   Pulse 63   Temp (!) 96.9 F (36.1 C) (Oral)   Ht 5\' 8"  (1.727 m)   Wt 120 lb 12.8 oz (54.8 kg)   BMI 18.37 kg/m   General: Well-nourished, well-developed in no acute distress.  Eyes: No icterus. Mouth: Oropharyngeal mucosa moist and pink , no lesions erythema or exudate. Lungs: Clear to auscultation bilaterally.  Heart: Regular rate and rhythm, no murmurs rubs or gallops.  Abdomen: Bowel sounds are normal, nontender, nondistended, no hepatosplenomegaly or masses, no abdominal bruits or hernia , no rebound or guarding.   Extremities: No lower extremity edema. No  clubbing or deformities. Neuro: Alert and oriented x 4   Skin: Warm and dry, no jaundice.   Psych: Alert and cooperative, normal mood and affect.  Labs:  Lab Results  Component Value Date   CREATININE 0.68 04/07/2017   BUN 25 (H) 04/07/2017   NA 135 04/07/2017   K 3.4 (L) 04/07/2017   CL 107 04/07/2017   CO2 22 04/07/2017   Lab Results  Component Value Date   WBC 5.5 04/07/2017   HGB 11.4 (L) 04/07/2017   HCT 35.2 (L) 04/07/2017   MCV 106.7 (H) 04/07/2017   PLT 195 04/07/2017     Imaging Studies: No  results found.

## 2017-05-21 NOTE — Patient Instructions (Signed)
1. Please collect stool and return to our office.  2. I have given you orders for labs. If you doctor does not do labs tomorrow, take our orders and have them done at your earliest convenience. If you doctor does labs tomorrow, please let me know.

## 2017-05-21 NOTE — Assessment & Plan Note (Signed)
Last EGD 2016, colonoscopy 2008.  History of left renal neoplasm ablated January 2018.  Last imaging May 2018, due for surveillance imaging May 2019 per IR.  History of macrocytic anemia with normal B12 and folate level.  Hemoglobin slightly improved 6 weeks ago.  Recheck labs, check ferritin and iron.  Check I FOBT.  Further recommendations to follow.

## 2017-05-21 NOTE — Assessment & Plan Note (Signed)
Significantly improved on pantoprazole twice daily and with avoiding trigger foods.  Still with some breakthrough symptoms.  No alarm symptoms at this time.  Weight has stabilized, possibly up couple of pounds.  Feels like he is eating better.  Continue current regimen.  Reinforced antireflux measures.

## 2017-05-22 DIAGNOSIS — R310 Gross hematuria: Secondary | ICD-10-CM | POA: Diagnosis not present

## 2017-05-22 DIAGNOSIS — C642 Malignant neoplasm of left kidney, except renal pelvis: Secondary | ICD-10-CM | POA: Diagnosis not present

## 2017-05-22 NOTE — Progress Notes (Signed)
CC'D TO PCP °

## 2017-05-26 ENCOUNTER — Other Ambulatory Visit: Payer: Self-pay

## 2017-05-26 ENCOUNTER — Ambulatory Visit (INDEPENDENT_AMBULATORY_CARE_PROVIDER_SITE_OTHER): Payer: Self-pay | Admitting: Gastroenterology

## 2017-05-26 DIAGNOSIS — R634 Abnormal weight loss: Secondary | ICD-10-CM

## 2017-05-26 LAB — IFOBT (OCCULT BLOOD): IFOBT: NEGATIVE

## 2017-05-27 DIAGNOSIS — K219 Gastro-esophageal reflux disease without esophagitis: Secondary | ICD-10-CM | POA: Diagnosis not present

## 2017-05-27 DIAGNOSIS — R634 Abnormal weight loss: Secondary | ICD-10-CM | POA: Diagnosis not present

## 2017-05-27 DIAGNOSIS — D649 Anemia, unspecified: Secondary | ICD-10-CM | POA: Diagnosis not present

## 2017-05-28 ENCOUNTER — Other Ambulatory Visit: Payer: Self-pay | Admitting: Urology

## 2017-05-28 DIAGNOSIS — R310 Gross hematuria: Secondary | ICD-10-CM

## 2017-05-28 DIAGNOSIS — C641 Malignant neoplasm of right kidney, except renal pelvis: Secondary | ICD-10-CM | POA: Diagnosis not present

## 2017-05-28 LAB — CBC WITH DIFFERENTIAL/PLATELET
BASOS ABS: 48 {cells}/uL (ref 0–200)
BASOS PCT: 0.9 %
Eosinophils Absolute: 329 cells/uL (ref 15–500)
Eosinophils Relative: 6.2 %
HCT: 32.4 % — ABNORMAL LOW (ref 38.5–50.0)
HEMOGLOBIN: 11 g/dL — AB (ref 13.2–17.1)
LYMPHS ABS: 912 {cells}/uL (ref 850–3900)
MCH: 34.6 pg — ABNORMAL HIGH (ref 27.0–33.0)
MCHC: 34 g/dL (ref 32.0–36.0)
MCV: 101.9 fL — ABNORMAL HIGH (ref 80.0–100.0)
MPV: 9.3 fL (ref 7.5–12.5)
Monocytes Relative: 14.3 %
NEUTROS PCT: 61.4 %
Neutro Abs: 3254 cells/uL (ref 1500–7800)
Platelets: 198 10*3/uL (ref 140–400)
RBC: 3.18 10*6/uL — ABNORMAL LOW (ref 4.20–5.80)
RDW: 12.6 % (ref 11.0–15.0)
TOTAL LYMPHOCYTE: 17.2 %
WBC: 5.3 10*3/uL (ref 3.8–10.8)
WBCMIX: 758 {cells}/uL (ref 200–950)

## 2017-05-28 LAB — IRON,TIBC AND FERRITIN PANEL
%SAT: 44 % (calc) (ref 15–60)
Ferritin: 68 ng/mL (ref 20–380)
IRON: 117 ug/dL (ref 50–180)
TIBC: 263 ug/dL (ref 250–425)

## 2017-05-28 NOTE — Progress Notes (Signed)
Pt notified of results

## 2017-05-28 NOTE — Progress Notes (Signed)
Hgb just slightly down. Magda Paganini to address further. Ferritin 68.

## 2017-05-28 NOTE — Progress Notes (Signed)
ifobt is negative. Please let patient know. Magda Paganini to offer further recommendations when she returns.

## 2017-06-02 ENCOUNTER — Ambulatory Visit (HOSPITAL_COMMUNITY)
Admission: RE | Admit: 2017-06-02 | Discharge: 2017-06-02 | Disposition: A | Discharge status: Home / Self Care | Payer: Medicare Other | Source: Ambulatory Visit | Attending: Urology | Admitting: Urology

## 2017-06-02 DIAGNOSIS — K5730 Diverticulosis of large intestine without perforation or abscess without bleeding: Secondary | ICD-10-CM | POA: Insufficient documentation

## 2017-06-02 DIAGNOSIS — I700 Atherosclerosis of aorta: Secondary | ICD-10-CM | POA: Diagnosis not present

## 2017-06-02 DIAGNOSIS — R310 Gross hematuria: Secondary | ICD-10-CM | POA: Diagnosis not present

## 2017-06-02 DIAGNOSIS — N201 Calculus of ureter: Secondary | ICD-10-CM | POA: Diagnosis not present

## 2017-06-02 DIAGNOSIS — I2510 Atherosclerotic heart disease of native coronary artery without angina pectoris: Secondary | ICD-10-CM | POA: Diagnosis not present

## 2017-06-02 DIAGNOSIS — Z9889 Other specified postprocedural states: Secondary | ICD-10-CM | POA: Diagnosis not present

## 2017-06-02 DIAGNOSIS — N200 Calculus of kidney: Secondary | ICD-10-CM | POA: Diagnosis not present

## 2017-06-02 LAB — POCT I-STAT CREATININE: CREATININE: 1 mg/dL (ref 0.61–1.24)

## 2017-06-02 MED ORDER — IOPAMIDOL (ISOVUE-300) INJECTION 61%
125.0000 mL | Freq: Once | INTRAVENOUS | Status: AC | PRN
  Administered 2017-06-02: 125 mL via INTRAVENOUS

## 2017-06-04 DIAGNOSIS — Z23 Encounter for immunization: Secondary | ICD-10-CM | POA: Diagnosis not present

## 2017-06-06 ENCOUNTER — Telehealth: Payer: Self-pay | Admitting: *Deleted

## 2017-06-06 NOTE — Telephone Encounter (Signed)
   [facility] Pre-operative Risk Assessment    Request for surgical clearance:  1. What type of surgery is being performed? Cysto, left retrograde pyelogram, left ureteroscopy, laser lithotripsy, stone extraction, and ureteral stent placement   2. When is this surgery scheduled? Pending clearance   3. What type of clearance is required (medical clearance vs. Pharmacy clearance to hold med vs. Both)?   4. Are there any medications that need to be held prior to surgery and how long?Plavix for 7 days, ok to stay on ASA per Dr. [name]   5. Practice name and name of physician performing surgery? [facility], Dr/ Dahlstedt   6. What is your office phone and fax number? [phone]   7. Anesthesia type (None, local, MAC, general) ?    Dale [name] 06/06/2017, 10:20 AM  _________________________________________________________________   (provider comments below)

## 2017-06-09 NOTE — Telephone Encounter (Signed)
Clearance given for surgery.  Okay to hold Plavix for the requested time.

## 2017-06-09 NOTE — Progress Notes (Signed)
Hemoglobin pretty stable. ifobt negative. H/o gross hematuria couple of times weekly for 1-2 months.  Patient had told me couple of weeks ago his appetite was improved and gerd about 75% better.  Last egd 2016. Last tcs 2008.   CT A/P with contrast last week: for gi findings, stomach normal.  Overall I feel patient is stable to improved with regards to weight loss, gerd, anemia.   If we did anything it would be an EGD. If patient is not interested, then we could offer follow up with RMR only to reevaluate in the next 6 weeks or so. Let me know what he decides.

## 2017-06-09 NOTE — Telephone Encounter (Signed)
   Primary Cardiologist: Shelva Majestic, MD  Chart reviewed as part of pre-operative protocol coverage. Patient was contacted 06/09/2017 in reference to pre-operative risk assessment for pending surgery as outlined below.  Dale [name] was last seen on 02/12/17 by [name]. H/o CAD (PCI RCA 2000, DES to acute margin of RCA 2009), hypothyroidism, HLD, GERD, renal cell carcinoma, asthma, HTN, PUD, pulmonary sarcoidosis, ITP, macular degeneration. At last catheterization in 2013 his RCA stents were patent and he had mild 30% LAD narrowing. Normal nuclear stress test 12/2016. Last echo 12/2016 showed EF 00-76%, normal diastolic function. Revised cardiac risk index calculated at 0.9%. Since that day, Dale [name] has done well. He asked that I speak with his wife to help answer the questions as he had trouble hearing on the phone. Per [facility] Activity Status Index he is able to complete 5.62 METS without any angina - he does all the grocery shopping, vacuuming, yardwork including mowing and bushhogging.   Therefore, based on ACC/AHA guidelines, the patient would be at acceptable risk for the planned procedure without further cardiovascular testing.   Dr. Dorina Hoyer requests clearance regarding holding "Plavix for 7 days; ok to stay on ASA per Dr. Dorina Hoyer." Will route to [name] for input on holding Plavix as requested. [name] - Please route response to P CV DIV PREOP (the pre-op pool). Thank you.    Charlie Pitter, PA-C 06/09/2017, 10:22 AM

## 2017-06-10 NOTE — Telephone Encounter (Signed)
Chart reviewed by [name].  Patient is cleared to hold Plavix for 7 days. Letter has been faxed to requesting surgeon.  Please contact the surgeon's office to ensure that the letter was received  Richardson Dopp, PA-C 06/10/2017 2:15 PM

## 2017-06-10 NOTE — Telephone Encounter (Signed)
Per Richardson Dopp, PA confirm if surgery clearance has been received from our office . Clearance letter faxed about 30 minutes ago through [employer].

## 2017-06-11 ENCOUNTER — Encounter (HOSPITAL_COMMUNITY): Payer: Self-pay | Admitting: *Deleted

## 2017-06-11 ENCOUNTER — Encounter: Payer: Self-pay | Admitting: Internal Medicine

## 2017-06-11 ENCOUNTER — Other Ambulatory Visit: Payer: Self-pay | Admitting: Urology

## 2017-06-11 ENCOUNTER — Other Ambulatory Visit: Payer: Self-pay

## 2017-06-11 NOTE — Progress Notes (Signed)
PATIENT SCHEDULED  °

## 2017-06-11 NOTE — Telephone Encounter (Signed)
Left message with Or Scheduler to verify if preop clearance was received.

## 2017-06-12 NOTE — Telephone Encounter (Signed)
Clearance routed via Epic fax function

## 2017-06-16 ENCOUNTER — Ambulatory Visit (HOSPITAL_COMMUNITY): Payer: Medicare Other | Admitting: Anesthesiology

## 2017-06-16 ENCOUNTER — Ambulatory Visit (HOSPITAL_COMMUNITY): Payer: Medicare Other

## 2017-06-16 ENCOUNTER — Encounter (HOSPITAL_COMMUNITY): Payer: Self-pay | Admitting: Emergency Medicine

## 2017-06-16 ENCOUNTER — Ambulatory Visit (HOSPITAL_COMMUNITY)
Admission: RE | Admit: 2017-06-16 | Discharge: 2017-06-16 | Disposition: A | Discharge status: Home / Self Care | Payer: Medicare Other | Source: Ambulatory Visit | Attending: Urology | Admitting: Urology

## 2017-06-16 ENCOUNTER — Other Ambulatory Visit: Payer: Self-pay

## 2017-06-16 ENCOUNTER — Encounter (HOSPITAL_COMMUNITY)
Admission: RE | Disposition: A | Discharge status: Home / Self Care | Payer: Self-pay | Source: Ambulatory Visit | Attending: Urology

## 2017-06-16 DIAGNOSIS — Z85828 Personal history of other malignant neoplasm of skin: Secondary | ICD-10-CM | POA: Diagnosis not present

## 2017-06-16 DIAGNOSIS — K219 Gastro-esophageal reflux disease without esophagitis: Secondary | ICD-10-CM | POA: Insufficient documentation

## 2017-06-16 DIAGNOSIS — Z87891 Personal history of nicotine dependence: Secondary | ICD-10-CM | POA: Diagnosis not present

## 2017-06-16 DIAGNOSIS — N200 Calculus of kidney: Secondary | ICD-10-CM | POA: Diagnosis present

## 2017-06-16 DIAGNOSIS — Z955 Presence of coronary angioplasty implant and graft: Secondary | ICD-10-CM | POA: Diagnosis not present

## 2017-06-16 DIAGNOSIS — N201 Calculus of ureter: Secondary | ICD-10-CM | POA: Insufficient documentation

## 2017-06-16 DIAGNOSIS — I2510 Atherosclerotic heart disease of native coronary artery without angina pectoris: Secondary | ICD-10-CM | POA: Diagnosis not present

## 2017-06-16 DIAGNOSIS — I110 Hypertensive heart disease with heart failure: Secondary | ICD-10-CM | POA: Diagnosis not present

## 2017-06-16 DIAGNOSIS — I10 Essential (primary) hypertension: Secondary | ICD-10-CM | POA: Insufficient documentation

## 2017-06-16 DIAGNOSIS — I509 Heart failure, unspecified: Secondary | ICD-10-CM | POA: Diagnosis not present

## 2017-06-16 HISTORY — PX: CYSTOSCOPY WITH RETROGRADE PYELOGRAM, URETEROSCOPY AND STENT PLACEMENT: SHX5789

## 2017-06-16 HISTORY — PX: HOLMIUM LASER APPLICATION: SHX5852

## 2017-06-16 LAB — BASIC METABOLIC PANEL
Anion gap: 6 (ref 5–15)
BUN: 25 mg/dL — AB (ref 6–20)
CHLORIDE: 112 mmol/L — AB (ref 101–111)
CO2: 21 mmol/L — AB (ref 22–32)
Calcium: 8.9 mg/dL (ref 8.9–10.3)
Creatinine, Ser: 0.69 mg/dL (ref 0.61–1.24)
GFR calc Af Amer: >60 mL/min (ref >60)
GFR calc non Af Amer: >60 mL/min (ref >60)
GLUCOSE: 99 mg/dL (ref 65–99)
POTASSIUM: 4 mmol/L (ref 3.5–5.1)
SODIUM: 139 mmol/L (ref 135–145)

## 2017-06-16 SURGERY — CYSTOURETEROSCOPY, WITH RETROGRADE PYELOGRAM AND STENT INSERTION
Anesthesia: General | Laterality: Left

## 2017-06-16 MED ORDER — ARTIFICIAL TEARS OPHTHALMIC OINT
TOPICAL_OINTMENT | OPHTHALMIC | Status: AC
  Filled 2017-06-16: qty 3.5

## 2017-06-16 MED ORDER — PHENYLEPHRINE 40 MCG/ML (10ML) SYRINGE FOR IV PUSH (FOR BLOOD PRESSURE SUPPORT)
PREFILLED_SYRINGE | INTRAVENOUS | Status: AC
  Filled 2017-06-16: qty 10

## 2017-06-16 MED ORDER — IOHEXOL 300 MG/ML  SOLN
INTRAMUSCULAR | Status: DC | PRN
  Administered 2017-06-16: 9 mL

## 2017-06-16 MED ORDER — LACTATED RINGERS IV SOLN
INTRAVENOUS | Status: DC | PRN
  Administered 2017-06-16 (×2): via INTRAVENOUS

## 2017-06-16 MED ORDER — LIDOCAINE 2% (20 MG/ML) 5 ML SYRINGE
INTRAMUSCULAR | Status: AC
  Filled 2017-06-16: qty 5

## 2017-06-16 MED ORDER — FENTANYL CITRATE (PF) 100 MCG/2ML IJ SOLN: 25.0000 ug | INTRAMUSCULAR | Status: DC | PRN

## 2017-06-16 MED ORDER — PHENYLEPHRINE HCL 10 MG/ML IJ SOLN
INTRAMUSCULAR | Status: DC | PRN
  Administered 2017-06-16 (×2): 80 ug via INTRAVENOUS
  Administered 2017-06-16: 50 ug via INTRAVENOUS

## 2017-06-16 MED ORDER — FENTANYL CITRATE (PF) 100 MCG/2ML IJ SOLN
INTRAMUSCULAR | Status: DC | PRN
  Administered 2017-06-16: 50 ug via INTRAVENOUS

## 2017-06-16 MED ORDER — PROPOFOL 10 MG/ML IV BOLUS
INTRAVENOUS | Status: DC | PRN
  Administered 2017-06-16: 80 mg via INTRAVENOUS

## 2017-06-16 MED ORDER — PROPOFOL 10 MG/ML IV BOLUS
INTRAVENOUS | Status: AC
  Filled 2017-06-16: qty 20

## 2017-06-16 MED ORDER — DEXAMETHASONE SODIUM PHOSPHATE 10 MG/ML IJ SOLN
INTRAMUSCULAR | Status: AC
  Filled 2017-06-16: qty 1

## 2017-06-16 MED ORDER — LIDOCAINE HCL (CARDIAC) 10 MG/ML IV SOLN
INTRAVENOUS | Status: DC | PRN
  Administered 2017-06-16: 75 mg via INTRAVENOUS

## 2017-06-16 MED ORDER — CEPHALEXIN 500 MG PO CAPS: 500.0000 mg | ORAL_CAPSULE | Freq: Two times a day (BID) | ORAL | 0 refills | Status: DC

## 2017-06-16 MED ORDER — OXYCODONE HCL 5 MG/5ML PO SOLN
5.0000 mg | Freq: Once | ORAL | Status: DC | PRN
  Filled 2017-06-16: qty 5

## 2017-06-16 MED ORDER — PHENYLEPHRINE HCL 10 MG/ML IJ SOLN
INTRAMUSCULAR | Status: AC
  Filled 2017-06-16: qty 1

## 2017-06-16 MED ORDER — ONDANSETRON HCL 4 MG/2ML IJ SOLN: 4.0000 mg | Freq: Once | INTRAMUSCULAR | Status: DC | PRN

## 2017-06-16 MED ORDER — LACTATED RINGERS IV SOLN
INTRAVENOUS | Status: DC
  Administered 2017-06-16: 08:00:00 via INTRAVENOUS

## 2017-06-16 MED ORDER — FENTANYL CITRATE (PF) 100 MCG/2ML IJ SOLN
INTRAMUSCULAR | Status: AC
  Filled 2017-06-16: qty 2

## 2017-06-16 MED ORDER — ONDANSETRON HCL 4 MG/2ML IJ SOLN
INTRAMUSCULAR | Status: AC
  Filled 2017-06-16: qty 2

## 2017-06-16 MED ORDER — ONDANSETRON HCL 4 MG/2ML IJ SOLN
INTRAMUSCULAR | Status: DC | PRN
  Administered 2017-06-16: 4 mg via INTRAVENOUS

## 2017-06-16 MED ORDER — CEFAZOLIN SODIUM-DEXTROSE 2-4 GM/100ML-% IV SOLN
2.0000 g | INTRAVENOUS | Status: AC
  Administered 2017-06-16: 2 g via INTRAVENOUS
  Filled 2017-06-16: qty 100

## 2017-06-16 MED ORDER — PHENYLEPHRINE HCL 10 MG/ML IJ SOLN
INTRAVENOUS | Status: DC | PRN
  Administered 2017-06-16: 25 ug/min via INTRAVENOUS

## 2017-06-16 MED ORDER — SODIUM CHLORIDE 0.9 % IR SOLN
Status: DC | PRN
  Administered 2017-06-16: 3000 mL

## 2017-06-16 MED ORDER — OXYCODONE HCL 5 MG PO TABS: 5.0000 mg | ORAL_TABLET | Freq: Once | ORAL | Status: DC | PRN

## 2017-06-16 SURGICAL SUPPLY — 20 items
BAG URO CATCHER STRL LF (MISCELLANEOUS) ×2 IMPLANT
BASKET LASER NITINOL 1.9FR (BASKET) IMPLANT
BASKET STONE 1.7 NGAGE (UROLOGICAL SUPPLIES) ×2 IMPLANT
BASKET ZERO TIP NITINOL 2.4FR (BASKET) IMPLANT
CATH INTERMIT  6FR 70CM (CATHETERS) ×2 IMPLANT
CLOTH BEACON ORANGE TIMEOUT ST (SAFETY) ×2 IMPLANT
COVER FOOTSWITCH UNIV (MISCELLANEOUS) IMPLANT
COVER SURGICAL LIGHT HANDLE (MISCELLANEOUS) IMPLANT
FIBER LASER FLEXIVA 365 (UROLOGICAL SUPPLIES) ×2 IMPLANT
FIBER LASER TRAC TIP (UROLOGICAL SUPPLIES) IMPLANT
GLOVE BIOGEL M 8.0 STRL (GLOVE) ×2 IMPLANT
GOWN STRL REUS W/ TWL XL LVL3 (GOWN DISPOSABLE) ×1 IMPLANT
GOWN STRL REUS W/TWL LRG LVL3 (GOWN DISPOSABLE) ×2 IMPLANT
GOWN STRL REUS W/TWL XL LVL3 (GOWN DISPOSABLE) ×1
GUIDEWIRE ANG ZIPWIRE 038X150 (WIRE) IMPLANT
GUIDEWIRE STR DUAL SENSOR (WIRE) ×2 IMPLANT
MANIFOLD NEPTUNE II (INSTRUMENTS) ×2 IMPLANT
PACK CYSTO (CUSTOM PROCEDURE TRAY) ×2 IMPLANT
STENT URET 6FRX24 CONTOUR (STENTS) ×2 IMPLANT
TUBING CONNECTING 10 (TUBING) ×2 IMPLANT

## 2017-06-16 NOTE — Op Note (Signed)
Preoperative diagnosis: Left mid ureteral calculus, 4-5 mm  Postoperative diagnosis: Same  Principal procedure: Cystoscopy, left ureteral pyelogram with fluoroscopic interpretation, left ureteroscopy, holmium laser lithotripsy and extraction of left mid ureteral stone, placement of 24 x 6 French contour double-J stent, without tether  Surgeon: Jaylie Neaves  Anesthesia: General with LMA  Complications: None  Specimen: Stone fragment  Estimated blood loss: None  Indications: 84 year old male presenting with gross hematuria recently.  He has a small renal mass that is being followed with active surveillance.  Because of his hematuria, recent hematuria CT revealed a nonobstructing left mid ureteral stone with periureteral inflammation, but no hydronephrosis.  He has been asymptomatic save for his hematuria.  At this time, he presents for cystoscopy, left ureteroscopic management of his stone and inspection of the ureter to rule out significant intraureteral abnormality.  Procedure as well as risks and complications including, but not limited to infection, bleeding, inability to obtain the stone with secondary procedure in the future have been discussed with the patient and his son.  They understand and desire to proceed.  Findings: The bladder appeared normal, with no urothelial abnormalities.  The prostate is not obstructing.  Ureteral orifices are normal in their location and configuration.  Left retrograde ureteropyelogram revealed a normal caliber ureter with a filling defect in the mid ureter consistent with the previously mentioned stone.  There was no evident hydronephrosis or stricture.  Pyelocalyceal system was normal.  Description of procedure: The patient was properly identified in the holding area.  Surgical site was marked.  He received preoperative IV antibiotics.  He was taken to the operating room where general anesthetic was administered with the LMA.  He was placed in the dorsal  lithotomy position, genitalia and perineum prepped and draped.  Proper timeout was performed.  A 22 French panendoscope was advanced under direct vision through his urethra which was normal throughout.  Bladder was entered and inspected circumferentially.  No tumors, foreign bodies or trabeculations.  Left ureteral orifice was cannulated with a 6 [place] open-ended catheter.  Using Omnipaque, retrograde ureteral pyelogram was performed with the above-mentioned findings.  A 0.038 inch sensor tip guidewire was then advanced through the open-ended catheter and advanced into the upper pole calyceal system where a curl was seen.  The cystoscope was removed.  The ureter was then sequentially dilated with first the  inner core and then the entire 12/14 ureteral access catheter.  I then removed the ureteral access catheter and passed the 6 French dual-lumen semirigid ureteroscope up to the stone.  The ureter was normal throughout.  There was no evidence of urothelial abnormality.  I attempted to remove the stone with the engage basket without laser application.  This was unsuccessful.  I then used a 69 m fiber to fragment the stone into multiple smaller fragments which were then easily grasped with the engage basket and extracted into the bladder.  A small urothelial injury was made on the right posterior ureter where the stone was grasped.  I felt that because of this, double-J stent should be placed postoperatively.  All fragments were extracted, and inspection of the ureter all the way to the UPJ was carried out with the ureteroscope with no further stones seen.  At this point, the ureteroscope was removed.  The cystoscope was advanced into the bladder and small fragments were removed from the posterior bladder and saved for specimen.  The scope was removed, the guidewire backloaded through the scope, and a 24 cm by  6 [place] contour double-J stent with the tether removed was then deployed in the ureter with excellent  proximal distal curl seen using fluoroscopy and cystoscopy, respectively.  The bladder was then drained and the scope removed.  The patient was then awakened and taken to the PACU.  He tolerated the procedure well.

## 2017-06-16 NOTE — Anesthesia Preprocedure Evaluation (Addendum)
Anesthesia Evaluation  Patient identified by MRN, date of birth, ID band Patient awake    Reviewed: Allergy & Precautions, NPO status , Patient's Chart, lab work & pertinent test results, reviewed documented beta blocker date and time   Airway Mallampati: I  TM Distance: >3 FB Neck ROM: Full    Dental  (+) Dental Advisory Given   Pulmonary former smoker,  Sarcoidosis   breath sounds clear to auscultation       Cardiovascular hypertension, Pt. on medications and Pt. on home beta blockers + CAD and + Cardiac Stents   Rhythm:Regular Rate:Normal  Carotid US - 1-39% b/l ICAS  Normal TTE 12/2016  EKG - SB with 1st deg AVB    Neuro/Psych    GI/Hepatic hiatal hernia, PUD, GERD  Medicated and Controlled,Schatzki's ring   Endo/Other  Hypothyroidism   Renal/GU      Musculoskeletal   Abdominal   Peds  Hematology ITP   Anesthesia Other Findings   Reproductive/Obstetrics                            Anesthesia Physical  Anesthesia Plan  ASA: III  Anesthesia Plan: General   Post-op Pain Management:    Induction: Intravenous  PONV Risk Score and Plan: 3 and Treatment may vary due to age or medical condition and Ondansetron  Airway Management Planned: LMA  Additional Equipment:   Intra-op Plan:   Post-operative Plan: Extubation in OR  Informed Consent: I have reviewed the patients History and Physical, chart, labs and discussed the procedure including the risks, benefits and alternatives for the proposed anesthesia with the patient or authorized representative who has indicated his/her understanding and acceptance.   Dental advisory given  Plan Discussed with: CRNA  Anesthesia Plan Comments:         Anesthesia Quick Evaluation

## 2017-06-16 NOTE — Anesthesia Procedure Notes (Signed)
Procedure Name: LMA Insertion Date/Time: 06/16/2017 9:23 AM Performed by: Lissa Morales, CRNA Pre-anesthesia Checklist: Patient identified, Emergency Drugs available, Suction available and Patient being monitored Patient Re-evaluated:Patient Re-evaluated prior to induction Oxygen Delivery Method: Circle system utilized Preoxygenation: Pre-oxygenation with 100% oxygen Induction Type: IV induction Ventilation: Mask ventilation without difficulty LMA: LMA with gastric port inserted LMA Size: 4.0 Tube type: Oral Number of attempts: 1 Airway Equipment and Method: Stylet and Oral airway Placement Confirmation: positive ETCO2 Tube secured with: Tape Dental Injury: Teeth and Oropharynx as per pre-operative assessment

## 2017-06-16 NOTE — OR Nursing (Signed)
Stone taken by Dr. Dahlstedt. 

## 2017-06-16 NOTE — Discharge Instructions (Signed)

## 2017-06-16 NOTE — Progress Notes (Signed)
Dr. [name] notified that pt held plavix for 5 days instead of 7 days and that pt took 81mg  aspirin yesterday. Dr. [name] okay to proceed with surgery today.

## 2017-06-16 NOTE — H&P (Signed)
H&P  Chief Complaint: Left sided kidney stone  History of Present Illness: 84 year old male presents for left ureteroscopy, possible holmium laser and extraction of a left ureteral stone.  He has been followed for a right renal mass is quite small, and is currently under surveillance rather than extirpated therapy.  Recent gross hematuria was evaluated with CT hematuria protocol which revealed several small bilateral renal calculi as well as a 4-5 mm left mid ureteral stone with accompanying left periureteral inflammation.  There was no hydronephrosis noted.  Because of the patient's left-sided stone and his hematuria, as well as an abnormal appearing ureter, he is undergoing cystoscopy, retrograde, left ureteroscopy, holmium laser and extraction of stone as well as biopsy of any material that looks abnormal.  Past Medical History:  Diagnosis Date  . Allergic rhinitis   . Anal fissure   . Anginal pain ([place])   . Asthma   . Cancer ([place])    skin  . Chest pain   . CHF (congestive heart failure) ([place])   . Coronary heart disease    s/p stenting. cath in 01/2012 noncritical occlusion  . Dysrhythmia    1st degree heart block  . GERD (gastroesophageal reflux disease)   . Glaucoma   . Hiatal hernia   . Hyperlipidemia   . Hypertension   . Hypothyroidism   . Idiopathic thrombocytopenic purpura ([place]) 2002  . Macular degeneration   . Nephrolithiasis   . PUD (peptic ulcer disease)    remote  . Sarcoidosis    pulmonary  . Schatzki's ring     Past Surgical History:  Procedure Laterality Date  . cardiac stents    . COLONOSCOPY  10/30/2006   Normal rectum, sigmoid diverticula.Remainder of colonic mucosa appeared normal.  . CORONARY ANGIOPLASTY WITH STENT PLACEMENT     about 10 years ago per pt (around 2007)  . ESOPHAGOGASTRODUODENOSCOPY  06/19/2004   Two esophageal rings and esophageal web as described above.  All of these were disrupted by passing 56-French Venia Minks dilator/ Candida  esophagitis,which appears to be incidental given history of   antibiotic use, but nevertheless will be treated.  . ESOPHAGOGASTRODUODENOSCOPY  10/30/2006   Distal tandem esophageal ring status post dilation disruption as  described above.  Otherwise normal esophagus/  Small hiatal hernia otherwise normal stomach, D1 and D2  . ESOPHAGOGASTRODUODENOSCOPY N/A 03/22/2015   Dr.Rourk- noncritical schatzki's ring and hiatal hernia-o/w normal EGD.   Marland Kitchen ESOPHAGOGASTRODUODENOSCOPY (EGD) WITH ESOPHAGEAL DILATION  03/04/2012   RMR- schatzki's ring, hiatal hernia, polypoid gastric mucosa, bx= minimally active gastritis.  Everlean Alstrom GENERIC HISTORICAL  03/06/2016   IR RADIOLOGIST EVAL & MGMT 03/06/2016 Aletta Edouard, MD [facility] INTERV RAD  . IR GENERIC HISTORICAL  06/18/2016   IR RADIOLOGIST EVAL & MGMT 06/18/2016 Aletta Edouard, MD [facility] INTERV RAD  . IR RADIOLOGIST EVAL & MGMT  10/01/2016  . LEFT HEART CATH N/A 02/02/2012   Procedure: LEFT HEART CATH;  Surgeon: Lorretta Harp, MD;  Location: [facility] CATH LAB;  Service: Cardiovascular;  Laterality: N/A;  . MEDIASTINOSCOPY     for dx sarcoid  . RADIOLOGY WITH ANESTHESIA Left 05/17/2016   Procedure: left renal ablation;  Surgeon: Aletta Edouard, MD;  Location: [facility] ORS;  Service: Radiology;  Laterality: Left;    Home Medications:    Allergies:  Allergies  Allergen Reactions  . Azithromycin Other (See Comments)    Sore mouth and fever blisters around mouth, sores in nose area as well  . Doxazosin Shortness Of Breath  .  Acetaminophen Other (See Comments)    REACTION: UNKNOWN REACTION  . Atenolol Other (See Comments)    REACTION: UNKNOWN REACTION  . Hydrocodone Nausea And Vomiting  . Hydrocodone-Acetaminophen Nausea Only  . Levofloxacin Other (See Comments)    Caused stomach problems.  . Morphine Other (See Comments)    "made me crazy"  . Penicillins Nausea And Vomiting and Other (See Comments)    Has patient had a PCN reaction causing immediate rash,  facial/tongue/throat swelling, SOB or lightheadedness with hypotension: No Has patient had a PCN reaction causing severe rash involving mucus membranes or skin necrosis: No Has patient had a PCN reaction that required hospitalization No Has patient had a PCN reaction occurring within the last 10 years: No If all of the above answers are "NO", then may proceed with Cephalosporin use.   . Sulfonamide Derivatives Nausea And Vomiting    Family History  Problem Relation Age of Onset  . Heart disease Father        deceased age 52  . Stroke Mother   . Alzheimer's disease Mother   . Heart attack Brother        deceased age 44  . Cancer Other        niece  . Colon cancer Neg Hx     Social History:  reports that he quit smoking about 47 years ago. His smoking use included cigarettes and cigars. He has a 0.20 pack-year smoking history. he has never used smokeless tobacco. He reports that he does not drink alcohol or use drugs.  ROS: A complete review of systems was performed.  All systems are negative except for pertinent findings as noted.  Physical Exam:  Vital signs in last 24 hours: Temp:  [97.5 F (36.4 C)] 97.5 F (36.4 C) (02/11 0725) Pulse Rate:  [53] 53 (02/11 0725) Resp:  [18] 18 (02/11 0725) BP: (133)/(63) 133/63 (02/11 0725) SpO2:  [100 %] 100 % (02/11 0725) Weight:  [125 lb 6.4 oz (56.9 kg)] 125 lb 6.4 oz (56.9 kg) (02/11 0725) Constitutional:  Alert and oriented, No acute distress Cardiovascular: Regular rate and rhythm, No JVD Respiratory: Normal respiratory effort, Lungs clear bilaterally GI: Abdomen is soft, nontender, nondistended, no abdominal masses Genitourinary: No CVAT. Normal male phallus, testes are descended bilaterally and non-tender and without masses, scrotum is normal in appearance without lesions or masses, perineum is normal on inspection. Lymphatic: No lymphadenopathy Neurologic: Grossly intact, no focal deficits Psychiatric: Normal mood and  affect  Laboratory Data:  No results for input(s): WBC, HGB, HCT, PLT in the last 72 hours.  No results for input(s): NA, K, CL, GLUCOSE, BUN, CALCIUM, CREATININE in the last 72 hours.  Invalid input(s): CO3   No results found for this or any previous visit (from the past 24 hour(s)). No results found for this or any previous visit (from the past 240 hour(s)).  Renal Function: No results for input(s): CREATININE in the last 168 hours. Estimated Creatinine Clearance: 42.7 mL/min (by C-G formula based on SCr of 1 mg/dL).  Radiologic Imaging: No results found.  Impression/Assessment:  Left mid ureteral stone with ureteral inflammation and hematuria  Plan:  Cystoscopy, left retrograde ureteropyelogram, left ureteroscopy, holmium laser lithotripsy and extraction of stone, possible ureteral biopsy, double-J stent

## 2017-06-16 NOTE — Progress Notes (Signed)
EKG given to Dr. Fransisco Beau.

## 2017-06-16 NOTE — Anesthesia Postprocedure Evaluation (Signed)
Anesthesia Post Note  Patient: Dale [name]  Procedure(s) Performed: CYSTOSCOPY WITH RETROGRADE PYELOGRAM, URETEROSCOPY,STONE EXTRACTION  AND STENT PLACEMENT (Left ) HOLMIUM LASER APPLICATION (Left )     Patient location during evaluation: PACU Anesthesia Type: General Level of consciousness: awake and alert Pain management: pain level controlled Vital Signs Assessment: post-procedure vital signs reviewed and stable Respiratory status: spontaneous breathing, nonlabored ventilation and respiratory function stable Cardiovascular status: blood pressure returned to baseline and stable Postop Assessment: no apparent nausea or vomiting Anesthetic complications: no    Last Vitals:  Vitals:   06/16/17 1114 06/16/17 1147  BP: (!) 110/58 (!) 101/51  Pulse: (!) 51 (!) 50  Resp: 16 16  Temp: (!) 36.4 C (!) 36.4 C  SpO2: 100% 100%    Last Pain:  Vitals:   06/16/17 1100  TempSrc:   PainSc: 0-No pain                 Audry Pili

## 2017-06-16 NOTE — Progress Notes (Signed)
PACU phase II nursing note,  Pt up to bathroom, voided bright red blood , no clots were noted.  Pt on anticoagulants.

## 2017-06-16 NOTE — Transfer of Care (Signed)
Immediate Anesthesia Transfer of Care Note  Patient: JACQUES FIFE  Procedure(s) Performed: CYSTOSCOPY WITH RETROGRADE PYELOGRAM, URETEROSCOPY,STONE EXTRACTION  AND STENT PLACEMENT (Left ) HOLMIUM LASER APPLICATION (Left )  Patient Location: PACU  Anesthesia Type:General  Level of Consciousness: awake, alert , oriented and patient cooperative  Airway & Oxygen Therapy: Patient Spontanous Breathing and Patient connected to face mask oxygen  Post-op Assessment: Report given to RN, Post -op Vital signs reviewed and stable and Patient moving all extremities X 4  Post vital signs: stable  Last Vitals:  Vitals:   06/16/17 0725 06/16/17 1018  BP: 133/63 (!) 144/63  Pulse: (!) 53 67  Resp: 18 (!) 7  Temp: (!) 36.4 C (!) 36.3 C  SpO2: 100% 100%    Last Pain:  Vitals:   06/16/17 0725  TempSrc: Oral      Patients Stated Pain Goal: 4 (62/26/33 3545)  Complications: No apparent anesthesia complications

## 2017-06-16 NOTE — Progress Notes (Signed)
Dr. Fransisco Beau notified that pt has had chest pain in past 2-3 weeks, no recent EKG (02-14-17). Verbal order to obtain new EKG today.

## 2017-06-17 ENCOUNTER — Encounter (HOSPITAL_COMMUNITY): Payer: Self-pay | Admitting: Urology

## 2017-07-08 ENCOUNTER — Ambulatory Visit (INDEPENDENT_AMBULATORY_CARE_PROVIDER_SITE_OTHER): Payer: Medicare Other | Admitting: Urology

## 2017-07-08 ENCOUNTER — Other Ambulatory Visit (HOSPITAL_COMMUNITY)
Admission: RE | Admit: 2017-07-08 | Discharge: 2017-07-08 | Disposition: A | Discharge status: Home / Self Care | Payer: Medicare Other | Source: Ambulatory Visit | Attending: Urology | Admitting: Urology

## 2017-07-08 DIAGNOSIS — N201 Calculus of ureter: Secondary | ICD-10-CM

## 2017-07-08 DIAGNOSIS — C641 Malignant neoplasm of right kidney, except renal pelvis: Secondary | ICD-10-CM

## 2017-07-10 LAB — URINE CULTURE: Culture: NO GROWTH

## 2017-07-15 ENCOUNTER — Other Ambulatory Visit: Payer: Self-pay | Admitting: Cardiovascular Disease

## 2017-07-17 ENCOUNTER — Telehealth: Payer: Self-pay | Admitting: Cardiovascular Disease

## 2017-07-17 NOTE — Telephone Encounter (Signed)
New Message   Pt c/o swelling: STAT is pt has developed SOB within 24 hours  1) How much weight have you gained and in what time span? Unknown   2) If swelling, where is the swelling located? Swelling in the feet  3) Are you currently taking a fluid pill? hydrochlorothiazide (HYDRODIURIL) 12.5 MG tablet  4) Are you currently SOB? no  5) Do you have a log of your daily weights (if so, list)? No   6) Have you gained 3 pounds in a day or 5 pounds in a week? No   7) Have you traveled recently? No

## 2017-07-17 NOTE — Telephone Encounter (Signed)
Returned call to wife [name] concerning patient of [name]. Patient has swelling in his feet that has been going on for a while. He is compliant with hctz 12.5mg  daily. He is having WEIGHT LOSS, he eats well but no weight gain. Wife states he does not report he is short of breath. Patient does not eat foods with a lot of sodium. Wife states patient does not elevate his legs. Wife states patient has worn compression stockings but they don't help. He does not monitor his home BP or HR. Per chart review, patient does not have HF.   Will route to MD/RN for advice and to scheduler to call patient for APP appointment

## 2017-07-17 NOTE — Telephone Encounter (Signed)
.    Agree with a APP evaluation

## 2017-07-21 ENCOUNTER — Telehealth: Payer: Self-pay | Admitting: Cardiovascular Disease

## 2017-07-21 NOTE — Telephone Encounter (Signed)
Scheduled appt Thursday 3/21 at 11:40 with [name].   Attempt to call x 2, line remains busy

## 2017-07-21 NOTE — Telephone Encounter (Signed)
Returned call to wife who is calling to follow up on previous telephone conversation.    Patient continues to have LE edema, no SOB, no weight gain.  States patient is losing weight and compression stockings are not helping his swelling.     appt made for 3/21 at 11:40 with [name].  Wife aware and verbalized understanding.

## 2017-07-21 NOTE — Telephone Encounter (Signed)
New Message     Pt c/o swelling: STAT is pt has developed SOB within 24 hours  1) How much weight have you gained and in what time span?  no  2) If swelling, where is the swelling located? Feet are swollen , trying compression stockings   3) Are you currently taking a fluid pill?  Yes 1 x a day  4) Are you currently SOB? no  5) Do you have a log of your daily weights (if so, list)? No, but he has lost a lot of weight down to almost 100lbs  6) Have you gained 3 pounds in a day or 5 pounds in a week? no  7) Have you traveled recently?  no

## 2017-07-22 ENCOUNTER — Ambulatory Visit (INDEPENDENT_AMBULATORY_CARE_PROVIDER_SITE_OTHER): Payer: Medicare Other | Admitting: Internal Medicine

## 2017-07-22 ENCOUNTER — Encounter: Payer: Self-pay | Admitting: Internal Medicine

## 2017-07-22 ENCOUNTER — Other Ambulatory Visit: Payer: Self-pay | Admitting: Urology

## 2017-07-22 VITALS — BP 126/63 | HR 66 | Temp 97.0°F | Ht 68.0 in | Wt 126.8 lb

## 2017-07-22 DIAGNOSIS — K219 Gastro-esophageal reflux disease without esophagitis: Secondary | ICD-10-CM

## 2017-07-22 DIAGNOSIS — R634 Abnormal weight loss: Secondary | ICD-10-CM

## 2017-07-22 DIAGNOSIS — N201 Calculus of ureter: Secondary | ICD-10-CM

## 2017-07-22 NOTE — Progress Notes (Signed)
Primary Care Physician:  Redmond School, MD Primary Gastroenterologist:  Dr. Gala Romney  Pre-Procedure History & Physical: HPI:  Dale [name] is a 84 y.o. male here for follow-up of GERD and weight loss.  GERD has been well controlled on Protonix 40 mg twice daily. No dysphagia.Patient has gained 8 pounds since he was last seen. States his appetite is good. He is Hemoccult negative. He denies any abdominal pain,  However,  he has been having gross hematuria and recently had lithotripsy by his report. He scheduled to see the urologist in the near future.    Past Medical History:  Diagnosis Date  . Allergic rhinitis   . Anal fissure   . Anginal pain ([place])   . Asthma   . Cancer ([place])    skin  . Chest pain   . CHF (congestive heart failure) ([place])   . Coronary heart disease    s/p stenting. cath in 01/2012 noncritical occlusion  . Dysrhythmia    1st degree heart block  . GERD (gastroesophageal reflux disease)   . Glaucoma   . Hiatal hernia   . Hyperlipidemia   . Hypertension   . Hypothyroidism   . Idiopathic thrombocytopenic purpura ([place]) 2002  . Macular degeneration   . Nephrolithiasis   . PUD (peptic ulcer disease)    remote  . Sarcoidosis    pulmonary  . Schatzki's ring     Past Surgical History:  Procedure Laterality Date  . cardiac stents    . COLONOSCOPY  10/30/2006   Normal rectum, sigmoid diverticula.Remainder of colonic mucosa appeared normal.  . CORONARY ANGIOPLASTY WITH STENT PLACEMENT     about 10 years ago per pt (around 2007)  . CYSTOSCOPY WITH RETROGRADE PYELOGRAM, URETEROSCOPY AND STENT PLACEMENT Left 06/16/2017   Procedure: CYSTOSCOPY WITH RETROGRADE PYELOGRAM, URETEROSCOPY,STONE EXTRACTION  AND STENT PLACEMENT;  Surgeon: Franchot Gallo, MD;  Location: [facility]S;  Service: Urology;  Laterality: Left;  . ESOPHAGOGASTRODUODENOSCOPY  06/19/2004   Two esophageal rings and esophageal web as described above.  All of these were disrupted by passing 56-French  Venia Minks dilator/ Candida esophagitis,which appears to be incidental given history of   antibiotic use, but nevertheless will be treated.  . ESOPHAGOGASTRODUODENOSCOPY  10/30/2006   Distal tandem esophageal ring status post dilation disruption as  described above.  Otherwise normal esophagus/  Small hiatal hernia otherwise normal stomach, D1 and D2  . ESOPHAGOGASTRODUODENOSCOPY N/A 03/22/2015   Dr.Kip Cropp- noncritical schatzki's ring and hiatal hernia-o/w normal EGD.   Marland Kitchen ESOPHAGOGASTRODUODENOSCOPY (EGD) WITH ESOPHAGEAL DILATION  03/04/2012   RMR- schatzki's ring, hiatal hernia, polypoid gastric mucosa, bx= minimally active gastritis.  Marland Kitchen HOLMIUM LASER APPLICATION Left 3/53/2992   Procedure: HOLMIUM LASER APPLICATION;  Surgeon: Franchot Gallo, MD;  Location: [facility] ORS;  Service: Urology;  Laterality: Left;  . IR GENERIC HISTORICAL  03/06/2016   IR RADIOLOGIST EVAL & MGMT 03/06/2016 Aletta Edouard, MD [facility] INTERV RAD  . IR GENERIC HISTORICAL  06/18/2016   IR RADIOLOGIST EVAL & MGMT 06/18/2016 Aletta Edouard, MD [facility] INTERV RAD  . IR RADIOLOGIST EVAL & MGMT  10/01/2016  . LEFT HEART CATH N/A 02/02/2012   Procedure: LEFT HEART CATH;  Surgeon: Lorretta Harp, MD;  Location: [facility] CATH LAB;  Service: Cardiovascular;  Laterality: N/A;  . MEDIASTINOSCOPY     for dx sarcoid  . RADIOLOGY WITH ANESTHESIA Left 05/17/2016   Procedure: left renal ablation;  Surgeon: Aletta Edouard, MD;  Location: [facility] ORS;  Service: Radiology;  Laterality: Left;  Prior to Admission medications   Medication Sig Start Date End Date Taking? Authorizing Provider  acetaZOLAMIDE (DIAMOX) 250 MG tablet Take 250 mg by mouth 2 (two) times daily.   Yes [provider]  amLODipine (NORVASC) 5 MG tablet Take 1 tablet (5 mg total) by mouth daily. 01/13/17  Yes Troy Sine, MD  aspirin 81 MG tablet Take 81 mg by mouth 3 (three) times a week.    Yes [provider]  atorvastatin (LIPITOR) 40 MG tablet Take one (1) tablet  (40 mg) by mouth every evening. 03/21/17  Yes Troy Sine, MD  clopidogrel (PLAVIX) 75 MG tablet Take 1 tablet (75 mg total) by mouth daily. 03/31/17  Yes Troy Sine, MD  dorzolamide (TRUSOPT) 2 % ophthalmic solution Place 1 drop into both eyes 2 (two) times daily.    Yes [provider]  finasteride (PROSCAR) 5 MG tablet Take 5 mg by mouth daily at 12 noon.    Yes [provider]  guaiFENesin (MUCINEX) 600 MG 12 hr tablet Take 1,200 mg by mouth daily.    Yes [provider]  hydrochlorothiazide (HYDRODIURIL) 12.5 MG tablet TAKE 1 TABLET BY MOUTH ONCE A DAY AS NEEDED FOR FLUID  RETENTION Patient taking differently: Take 12.5 mg by mouth once daily 03/20/17  Yes Troy Sine, MD  isosorbide mononitrate (IMDUR) 60 MG 24 hr tablet TAKE 1 TABLET BY MOUTH IN  THE MORNING AND 1/2 TABLET  AT NIGHT Patient taking differently: TAKE 60 MG BY MOUTH IN  THE MORNING AND 30 MG BY MOUTH AT NIGHT 05/12/17  Yes Troy Sine, MD  latanoprost (XALATAN) 0.005 % ophthalmic solution Place 1 drop into both eyes at bedtime.   Yes [provider]  levothyroxine (SYNTHROID, LEVOTHROID) 100 MCG tablet TAKE 1 TABLET BY MOUTH  DAILY 07/16/17  Yes Troy Sine, MD  losartan (COZAAR) 100 MG tablet TAKE 1 TABLET BY MOUTH  DAILY Patient taking differently: TAKE 100 MG BY MOUTH  DAILY 03/20/17  Yes Troy Sine, MD  metoprolol tartrate (LOPRESSOR) 25 MG tablet TAKE ONE-HALF TABLET BY  MOUTH TWICE A DAY 07/16/17  Yes Troy Sine, MD  mometasone (ELOCON) 0.1 % ointment Apply 1 application topically daily.    Yes [provider]  Multiple Vitamin (MULTIVITAMIN) tablet Take 1 tablet by mouth every evening.    Yes [provider]  Multiple Vitamins-Minerals (PRESERVISION/LUTEIN) CAPS Take 1 capsule by mouth 2 (two) times daily.    Yes [provider]  nitroGLYCERIN (NITROSTAT) 0.4 MG SL tablet Place 1 tablet (0.4 mg total) under the tongue every 5 (five)  minutes as needed. For chest pain Patient taking differently: Place 0.4 mg under the tongue every 5 (five) minutes as needed for chest pain.  03/14/16  Yes Troy Sine, MD  pantoprazole (PROTONIX) 40 MG tablet Take 1 tablet (40 mg total) by mouth 2 (two) times daily before a meal. 02/10/17  Yes Annitta Needs, NP  potassium chloride (K-DUR,KLOR-CON) 10 MEQ tablet TAKE 1 TABLET BY MOUTH  DAILY 07/16/17  Yes Troy Sine, MD  timolol (TIMOPTIC) 0.5 % ophthalmic solution Place 1 drop into both eyes daily.    Yes [provider]  triamcinolone (KENALOG) 0.1 % cream Apply 1 application topically 2 (two) times daily as needed (for irritation).    Yes [provider]  vitamin C (ASCORBIC ACID) 500 MG tablet Take 500 mg by mouth daily.    Yes [provider]  vitamin E 200 UNIT capsule Take 200 Units by mouth daily at 12 noon.    Yes [provider]  cephALEXin (KEFLEX) 500 MG capsule Take 1 capsule (500 mg total) by mouth 2 (two) times daily. Patient not taking: Reported on 07/22/2017 06/16/17   Franchot Gallo, MD  ondansetron (ZOFRAN ODT) 4 MG disintegrating tablet 4mg  ODT q4 hours prn nausea/vomit Patient not taking: Reported on 07/22/2017 05/11/13   Elnora Morrison, MD  potassium chloride (K-DUR) 10 MEQ tablet Take 1 tablet (10 mEq total) by mouth daily. 10/16/12 12/20/13  Troy Sine, MD    Allergies as of 07/22/2017 - Review Complete 07/22/2017  Allergen Reaction Noted  . Azithromycin Other (See Comments) 06/03/2013  . Doxazosin Shortness Of Breath 02/11/2012  . Acetaminophen Other (See Comments)   . Atenolol Other (See Comments)   . Hydrocodone Nausea And Vomiting 08/15/2011  . Hydrocodone-acetaminophen Nausea Only 07/19/2011  . Levofloxacin Other (See Comments) 09/26/2014  . Morphine Other (See Comments)   . Penicillins Nausea And Vomiting and Other (See Comments)   . Sulfonamide derivatives Nausea And Vomiting     Family History  Problem Relation  Age of Onset  . Heart disease Father        deceased age 57  . Stroke Mother   . Alzheimer's disease Mother   . Heart attack Brother        deceased age 82  . Cancer Other        niece  . Colon cancer Neg Hx     Social History   Socioeconomic History  . Marital status: Married    Spouse name: Not on file  . Number of children: 1  . Years of education: Not on file  . Highest education level: Not on file  Social Needs  . Financial resource strain: Not on file  . Food insecurity - worry: Not on file  . Food insecurity - inability: Not on file  . Transportation needs - medical: Not on file  . Transportation needs - non-medical: Not on file  Occupational History  . 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 Smoking status: Former Smoker    Packs/day: 0.10    Years: 2.00    Pack years: 0.20    Types: Cigarettes, Cigars    Last attempt to quit: 05/06/1970    Years since quitting: 47.2  . Smokeless tobacco: Never Used  Substance and Sexual Activity  . Alcohol use: No    Alcohol/week: 0.0 oz  . Drug use: No  . Sexual activity: No  Other Topics Concern  . Not on file  Social History Narrative  . Not on file    Review of Systems: See HPI, otherwise negative ROS  Physical Exam: BP 126/63   Pulse 66   Temp (!) 97 F (36.1 C) (Oral)   Ht 5\' 8"  (1.727 m)   Wt 126 lb 12.8 oz (57.5 kg)   BMI 19.28 kg/m  General:   Somewhat frail elderly pleasant and cooperative in NAD Neck:  Supple; no masses or thyromegaly. No significant cervical adenopathy. Lungs:  Clear throughout to auscultation.   No wheezes, crackles, or rhonchi. No acute distress. Heart:  Regular rate and rhythm; no murmurs, clicks, rubs,  or gallops. Abdomen: Non-distended, normal bowel sounds.  Soft and nontender without appreciable mass or hepatosplenomegaly.  Pulses:  Normal pulses noted. Extremities:  Without clubbing or edema.  Impression:  84 year old gentleman with long-standing GERD well controlled on twice a day Protonix. No dysphagia. In fact, no GI symptoms at this time. A pound weight gain is reassuring. Hemoccult negative previously.  He does report gross hematuria to me.  Recommendations:   Decrease protonix to 40 mg once daily before breakfast  See urologist about blood in urine   GERD information provided  Office visit with [place] in 1 year and as needed    Notice: This dictation was prepared with Dragon dictation along with smaller phrase technology. Any transcriptional errors that result from this process are unintentional and may not be corrected upon review.

## 2017-07-22 NOTE — Patient Instructions (Signed)
Decrease protonix to 40 mg once daily before breakfast  See urologist about blood in urine   GERD information provided  Office visit with [place] in 1 year and as needed

## 2017-07-24 ENCOUNTER — Ambulatory Visit (INDEPENDENT_AMBULATORY_CARE_PROVIDER_SITE_OTHER): Payer: Medicare Other | Admitting: Cardiovascular Disease

## 2017-07-24 ENCOUNTER — Encounter: Payer: Self-pay | Admitting: Cardiovascular Disease

## 2017-07-24 VITALS — BP 126/58 | HR 60 | Ht 68.0 in | Wt 127.0 lb

## 2017-07-24 DIAGNOSIS — I440 Atrioventricular block, first degree: Secondary | ICD-10-CM

## 2017-07-24 DIAGNOSIS — I25110 Atherosclerotic heart disease of native coronary artery with unstable angina pectoris: Secondary | ICD-10-CM

## 2017-07-24 DIAGNOSIS — N200 Calculus of kidney: Secondary | ICD-10-CM | POA: Diagnosis not present

## 2017-07-24 DIAGNOSIS — R634 Abnormal weight loss: Secondary | ICD-10-CM

## 2017-07-24 DIAGNOSIS — R0989 Other specified symptoms and signs involving the circulatory and respiratory systems: Secondary | ICD-10-CM | POA: Diagnosis not present

## 2017-07-24 DIAGNOSIS — E039 Hypothyroidism, unspecified: Secondary | ICD-10-CM | POA: Diagnosis not present

## 2017-07-24 DIAGNOSIS — I10 Essential (primary) hypertension: Secondary | ICD-10-CM | POA: Diagnosis not present

## 2017-07-24 NOTE — Progress Notes (Signed)
Patient ID: Dale [name], male   DOB: 12/05/1928, 84 y.o.   MRN: [id number]    Primary M.D.: Dr. Redmond School  HPI: Dale [name] is a 84 y.o. male who presents to the office for a 5 month cardiology evaluation.  Dale [name] has known CAD and underwent initial intervention to his RCA in 2000. In 2009 a Cypher stent was placed beyond the acute margin of his RCA. His last cardiac catheterization was in September 2013 by Dr. [name] which showed 30% LAD narrowing after the second diagonal vessel, and normal left circumflex coronary artery, and patent RCA stents.  He has a history of hypothyroidism on Synthroid replacement and has a history of hyperlipidemia. He had been on on Crestor 5 mg plus Zetia 10 mg; however, due to recent increased cost of Zetia, this was discontinued and he has been on Crestor 20 mg daily.  He also has a history of GERD treated with Protonix, and he continues to be on dual antiplatelet therapy. He is responsive to Plavix on previous P2 Y12 testing.  When I saw him in 2016 he had experienced 3-4 episodes of chest pain over the six-month period.  Most of these episodes have occurred at night and were nitrate responsive.  He states that they were similar to his previous discomfort.  When I saw him, I further titrated his isosorbide mononitrate to 60 mg in the morning and 30 mg at night.    He saw Dale [name], [place] in August 2018 with weight loss, weakness, and vague chest pain.  He felt his chest pain was atypical and not ischemic.  An echo Doppler study on 12/23/2016 showed an EF of 55-60%.  He was hospitalized on August 29 through January 03, 2017 at  [facility].  Apparently he had a nuclear stress test which did not reveal any reversible ischemia.  He was also felt to have acute sinusitis for which he was treated with antibiotics.  He has a history of renal cell CA.Marland Kitchen     When I last saw him in October 2018, his chest pain had resolved. He underwent a carotid duplex study in October  2018 which showed mild plaque bilaterally at 1-39%. He denies increasing episodes of chest pain.  At times he notes rare chest discomfort which is short-lived.  He had developed some gross hematuria and was found to have a kidney stone which he underwent cystoscopy, left ureteral pyelogram with fluoroscopic interpretation, left ureteroscopy, holmium laser lithotripsy and extraction of left mid ureteral stone, placement of 24 x 6 French contour double-J stent, without tether by Dr [name] on 06/16/2017.    He also is followed by Dr. Herbie [place] workup [facility] [place].  Presently, he denies palpitations.  He denies PND, orthopnea.  He presents for evaluation.  Past Medical History:  Diagnosis Date  . Allergic rhinitis   . Anal fissure   . Anginal pain ([place])   . Asthma   . Cancer ([place])    skin  . Chest pain   . CHF (congestive heart failure) ([place])   . Coronary heart disease    s/p stenting. cath in 01/2012 noncritical occlusion  . Dysrhythmia    1st degree heart block  . GERD (gastroesophageal reflux disease)   . Glaucoma   . Hiatal hernia   . Hyperlipidemia   . Hypertension   . Hypothyroidism   . Idiopathic thrombocytopenic purpura ([place]) 2002  . Macular degeneration   . Nephrolithiasis   . PUD (peptic ulcer  disease)    remote  . Sarcoidosis    pulmonary  . Schatzki's ring     Past Surgical History:  Procedure Laterality Date  . cardiac stents    . COLONOSCOPY  10/30/2006   Normal rectum, sigmoid diverticula.Remainder of colonic mucosa appeared normal.  . CORONARY ANGIOPLASTY WITH STENT PLACEMENT     about 10 years ago per pt (around 2007)  . CYSTOSCOPY WITH RETROGRADE PYELOGRAM, URETEROSCOPY AND STENT PLACEMENT Left 06/16/2017   Procedure: CYSTOSCOPY WITH RETROGRADE PYELOGRAM, URETEROSCOPY,STONE EXTRACTION  AND STENT PLACEMENT;  Surgeon: Franchot Gallo, MD;  Location: [facility];  Service: Urology;  Laterality: Left;  . ESOPHAGOGASTRODUODENOSCOPY  06/19/2004   Two esophageal rings  and esophageal web as described above.  All of these were disrupted by passing 56-French Venia Minks dilator/ Candida esophagitis,which appears to be incidental given history of   antibiotic use, but nevertheless will be treated.  . ESOPHAGOGASTRODUODENOSCOPY  10/30/2006   Distal tandem esophageal ring status post dilation disruption as  described above.  Otherwise normal esophagus/  Small hiatal hernia otherwise normal stomach, D1 and D2  . ESOPHAGOGASTRODUODENOSCOPY N/A 03/22/2015   Dr.Rourk- noncritical schatzki's ring and hiatal hernia-o/w normal EGD.   Marland Kitchen ESOPHAGOGASTRODUODENOSCOPY (EGD) WITH ESOPHAGEAL DILATION  03/04/2012   RMR- schatzki's ring, hiatal hernia, polypoid gastric mucosa, bx= minimally active gastritis.  Marland Kitchen HOLMIUM LASER APPLICATION Left 11/28/3662   Procedure: HOLMIUM LASER APPLICATION;  Surgeon: Franchot Gallo, MD;  Location: [facility];  Service: Urology;  Laterality: Left;  . IR GENERIC HISTORICAL  03/06/2016   IR RADIOLOGIST EVAL & MGMT 03/06/2016 Aletta Edouard, MD [facility] INTERV RAD  . IR GENERIC HISTORICAL  06/18/2016   IR RADIOLOGIST EVAL & MGMT 06/18/2016 Aletta Edouard, MD [facility] INTERV RAD  . IR RADIOLOGIST EVAL & MGMT  10/01/2016  . LEFT HEART CATH N/A 02/02/2012   Procedure: LEFT HEART CATH;  Surgeon: Lorretta Harp, MD;  Location: [facility] CATH LAB;  Service: Cardiovascular;  Laterality: N/A;  . MEDIASTINOSCOPY     for dx sarcoid  . RADIOLOGY WITH ANESTHESIA Left 05/17/2016   Procedure: left renal ablation;  Surgeon: Aletta Edouard, MD;  Location: [facility];  Service: Radiology;  Laterality: Left;    Allergies  Allergen Reactions  . Azithromycin Other (See Comments)    Sore mouth and fever blisters around mouth, sores in nose area as well  . Doxazosin Shortness Of Breath  . Acetaminophen Other (See Comments)    REACTION: UNKNOWN REACTION  . Atenolol Other (See Comments)    REACTION: UNKNOWN REACTION  . Hydrocodone Nausea And Vomiting  . Hydrocodone-Acetaminophen Nausea  Only  . Levofloxacin Other (See Comments)    Caused stomach problems.  . Morphine Other (See Comments)    "made me crazy"  . Penicillins Nausea And Vomiting and Other (See Comments)    Has patient had a PCN reaction causing immediate rash, facial/tongue/throat swelling, SOB or lightheadedness with hypotension: No Has patient had a PCN reaction causing severe rash involving mucus membranes or skin necrosis: No Has patient had a PCN reaction that required hospitalization No Has patient had a PCN reaction occurring within the last 10 years: No If all of the above answers are "NO", then may proceed with Cephalosporin use.   . Sulfonamide Derivatives Nausea And Vomiting    Current Outpatient Medications  Medication Sig Dispense Refill  . acetaZOLAMIDE (DIAMOX) 250 MG tablet Take 250 mg by mouth 2 (two) times daily.    Marland Kitchen amLODipine (NORVASC) 5 MG tablet Take 1 tablet (5  mg total) by mouth daily. 90 tablet 3  . aspirin 81 MG tablet Take 81 mg by mouth 3 (three) times a week.     Marland Kitchen atorvastatin (LIPITOR) 40 MG tablet Take one (1) tablet (40 mg) by mouth every evening. 90 tablet 3  . cephALEXin (KEFLEX) 500 MG capsule Take 1 capsule (500 mg total) by mouth 2 (two) times daily. 6 capsule 0  . clopidogrel (PLAVIX) 75 MG tablet Take 1 tablet (75 mg total) by mouth daily. 90 tablet 3  . dorzolamide (TRUSOPT) 2 % ophthalmic solution Place 1 drop into both eyes 2 (two) times daily.     . finasteride (PROSCAR) 5 MG tablet Take 5 mg by mouth daily at 12 noon.     Marland Kitchen guaiFENesin (MUCINEX) 600 MG 12 hr tablet Take 1,200 mg by mouth daily.     . hydrochlorothiazide (HYDRODIURIL) 12.5 MG tablet TAKE 1 TABLET BY MOUTH ONCE A DAY AS NEEDED FOR FLUID  RETENTION (Patient taking differently: Take 12.5 mg by mouth once daily) 90 tablet 3  . isosorbide mononitrate (IMDUR) 60 MG 24 hr tablet TAKE 1 TABLET BY MOUTH IN  THE MORNING AND 1/2 TABLET  AT NIGHT (Patient taking differently: TAKE 60 MG BY MOUTH IN  THE MORNING  AND 30 MG BY MOUTH AT NIGHT) 135 tablet 0  . latanoprost (XALATAN) 0.005 % ophthalmic solution Place 1 drop into both eyes at bedtime.    Marland Kitchen levothyroxine (SYNTHROID, LEVOTHROID) 100 MCG tablet TAKE 1 TABLET BY MOUTH  DAILY 90 tablet 0  . losartan (COZAAR) 100 MG tablet TAKE 1 TABLET BY MOUTH  DAILY (Patient taking differently: TAKE 100 MG BY MOUTH  DAILY) 90 tablet 3  . metoprolol tartrate (LOPRESSOR) 25 MG tablet TAKE ONE-HALF TABLET BY  MOUTH TWICE A DAY 90 tablet 0  . mometasone (ELOCON) 0.1 % ointment Apply 1 application topically daily.     . Multiple Vitamin (MULTIVITAMIN) tablet Take 1 tablet by mouth every evening.     . Multiple Vitamins-Minerals (PRESERVISION/LUTEIN) CAPS Take 1 capsule by mouth 2 (two) times daily.     . nitroGLYCERIN (NITROSTAT) 0.4 MG SL tablet Place 1 tablet (0.4 mg total) under the tongue every 5 (five) minutes as needed. For chest pain (Patient taking differently: Place 0.4 mg under the tongue every 5 (five) minutes as needed for chest pain. ) 25 tablet 3  . ondansetron (ZOFRAN ODT) 4 MG disintegrating tablet 60m ODT q4 hours prn nausea/vomit 4 tablet 0  . pantoprazole (PROTONIX) 40 MG tablet Take 1 tablet (40 mg total) by mouth 2 (two) times daily before a meal. 180 tablet 2  . potassium chloride (K-DUR,KLOR-CON) 10 MEQ tablet TAKE 1 TABLET BY MOUTH  DAILY 90 tablet 1  . timolol (TIMOPTIC) 0.5 % ophthalmic solution Place 1 drop into both eyes daily.     .Marland Kitchentriamcinolone (KENALOG) 0.1 % cream Apply 1 application topically 2 (two) times daily as needed (for irritation).     . vitamin C (ASCORBIC ACID) 500 MG tablet Take 500 mg by mouth daily.     . vitamin E 200 UNIT capsule Take 200 Units by mouth daily at 12 noon.      No current facility-administered medications for this visit.     Social History   Socioeconomic History  . Marital status: Married    Spouse name: Not on file  . Number of children: 1  . Years of education: Not on file  . Highest education  level: Not on  file  Occupational History  . 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 Financial resource strain: Not on file  . Food insecurity:    Worry: Not on file    Inability: Not on file  . Transportation needs:    Medical: Not on file    Non-medical: Not on file  Tobacco Use  . Smoking status: Former Smoker    Packs/day: 0.10    Years: 2.00    Pack years: 0.20    Types: Cigarettes, Cigars    Last attempt to quit: 05/06/1970    Years since quitting: 47.2  . Smokeless tobacco: Never Used  Substance and Sexual Activity  . Alcohol use: No    Alcohol/week: 0.0 oz  . Drug use: No  . Sexual activity: Never  Lifestyle  . Physical activity:    Days per week: Not on file    Minutes per session: Not on file  . Stress: Not on file  Relationships  . Social connections:    Talks on phone: Not on file    Gets together: Not on file    Attends religious service: Not on file    Active member of club or organization: Not on file    Attends meetings of clubs or organizations: Not on file    Relationship status: Not on file  . Intimate partner violence:    Fear of current or ex partner: Not on file    Emotionally abused: Not on file    Physically abused: Not on file    Forced sexual activity: Not on file  Other Topics Concern  . Not on file  Social History Narrative  . Not on file   Additional social history is notable that he is married and lives with his wife. He quit smoking over 40 years ago. Has one child. He remains active.  Family History  Problem Relation Age of Onset  . Heart disease Father        deceased age 69  . Stroke Mother   . Alzheimer's disease Mother   . Heart attack Brother        deceased age 46  . Cancer Other        niece  . Colon cancer Neg Hx    ROS General: Negative; No fevers, chills, or night sweats; Positive for weight loss HEENT: Positive for recent sinusitis.  No changes in  vision., difficulty swallowing Pulmonary: Negative; No cough, wheezing, shortness of breath, hemoptysis Cardiovascular: See history of present illness GI: Negative; No nausea, vomiting, diarrhea, or abdominal pain GU: History of renal cell CA; status post renal stone removal Musculoskeletal: Negative; no myalgias, joint pain, or weakness Hematologic/Oncology: Negative; no easy bruising, bleeding Endocrine: Positive for hypothyroidism; no diabetes Neuro: Negative; no changes in balance, headaches Skin: Negative; No rashes or skin lesions Psychiatric: Negative; No behavioral problems, depression Sleep: Negative; No snoring, daytime sleepiness, hypersomnolence, bruxism, restless legs, hypnogognic hallucinations, no cataplexy Other comprehensive 14 point system review is negative.   PE BP (!) 126/58   Pulse 60   Ht '5\' 8"'$  (1.727 m)   Wt 127 lb (57.6 kg)   BMI 19.31 kg/m    Repeat blood pressure by me was 122/66  Wt Readings from Last 3 Encounters:  07/24/17 127 lb (57.6 kg)  07/22/17 126 lb 12.8 oz (57.5 kg)  06/16/17 125 lb 6.4 oz (56.9 kg)    General: Alert, oriented, no distress. ,  very thin. Skin: normal turgor, no rashes, warm and dry HEENT: Normocephalic, atraumatic. Pupils equal round and reactive to light; sclera anicteric; extraocular muscles intact; Nose without nasal septal hypertrophy Mouth/Parynx benign; Mallinpatti scale 2 Neck: No JVD, soft left carotid bruit.  Normal upstroke Lungs:.clear to ausculatation and percussion; no wheezing or rales Chest wall: without tenderness to palpitation Heart: PMI not displaced, RRR, s1 s2 normal, 1/6 systolic murmur, no diastolic murmur, no rubs, gallops, thrills, or heaves Abdomen: soft, nontender; no hepatosplenomehaly, BS+; abdominal aorta nontender and not dilated by palpation. Back: no CVA tenderness Pulses 2+ Musculoskeletal: full range of motion, normal strength, no joint deformities Extremities: no clubbing cyanosis or  edema, Homan's sign negative  Neurologic: grossly nonfocal; Cranial nerves grossly wnl Psychologic: Normal mood and affect  ECG (independently read by me): normal sinus rhythm at 60 bpm with first-degree AV block.  PR interval 236 ms.  QTc interval normal.  No ectopy.  October 2018 ECG (independently read by me): Sinus bradycardia 50 bpm.  First degree AV block with a PR interval at 224 ms.  No syncope ST changes.  September 2017 ECG (independently read by me): Sinus bradycardia 59 bpm.  First-degree AV block with PR interval 240 ms.  No significant ST-T changes.  November 2016 ECG (independently read by me): Sinus bradycardia at 55 beats per minute with first-degree AV block.  PR interval 246 ms.  No significant ST-T changes.  May 2016 ECG (independently read by me): Sinus rhythm with first-degree heart block.  PR interval 226.  No ST segment changes.  February 2016 ECG (independently read by me): Sinus rhythm at 65 bpm.  Mild first-degree AV block with a PR interval 228 ms.  No significant ST segment changes.  December 2015 ECG (independently read by me): Sinus bradycardia 57 bpm.  Borderline first-degree AV block with a PR interval 216 daily.  Seconds.  QTc interval is normal at 373 ms.  June 2015 ECG (independently read by me): Sinus bradycardia 54 beats per minute.  First degree block with a PR interval of 232 ms.  No significant ST-T changes.  Prior ECG: Sinus bradycardia 54 beats per minute. PR interval 206 ms. QTc interval normal  LABS: BMP Latest Ref Rng & Units 06/16/2017 06/02/2017 04/07/2017  Glucose 65 - 99 mg/dL 99 - 114(H)  BUN 6 - 20 mg/dL 25(H) - 25(H)  Creatinine 0.61 - 1.24 mg/dL 0.69 1.00 0.68  BUN/Creat Ratio 10 - 24 - - -  Sodium 135 - 145 mmol/L 139 - 135  Potassium 3.5 - 5.1 mmol/L 4.0 - 3.4(L)  Chloride 101 - 111 mmol/L 112(H) - 107  CO2 22 - 32 mmol/L 21(L) - 22  Calcium 8.9 - 10.3 mg/dL 8.9 - 8.9   Hepatic Function Latest Ref Rng & Units 09/08/2015 07/07/2014  05/11/2013  Total Protein 6.0 - 8.5 g/dL 6.3 6.5 6.7  Albumin 3.5 - 4.7 g/dL 3.9 4.2 3.6  AST 0 - 40 IU/L _0 ALT 0 - 44 IU/L _1 Alk Phosphatase 39 - 117 IU/L 76 73 74  Total Bilirubin 0.0 - 1.2 mg/dL 0.7 0.4 0.4  Bilirubin, Direct - - - -   CBC Latest Ref Rng & Units 05/27/2017 04/07/2017 01/02/2017  WBC 3.8 - 10.8 Thousand/uL 5.3 5.5 6.2  Hemoglobin 13.2 - 17.1 g/dL 11.0(L) 11.4(L) 10.8(L)  Hematocrit 38.5 - 50.0 % 32.4(L) 35.2(L) 33.3(L)  Platelets 140 - 400 Thousand/uL 198 195 182   Lab Results  Component Value  Date   MCV 101.9 (H) 05/27/2017   MCV 106.7 (H) 04/07/2017   MCV 102.8 (H) 01/02/2017   Lab Results  Component Value Date   TSH 1.304 01/02/2017  No results found for: HGBA1C   Lipid Panel     Component Value Date/Time   CHOL 98 01/02/2017 0612   CHOL 127 09/08/2015 0933   TRIG 48 01/02/2017 0612   HDL 37 (L) 01/02/2017 0612   HDL 48 09/08/2015 0933   CHOLHDL 2.6 01/02/2017 0612   VLDL 10 01/02/2017 0612   LDLCALC 51 01/02/2017 0612   LDLCALC 61 09/08/2015 0933   RADIOLOGY: No results found.  IMPRESSION:  1. Coronary artery disease involving native coronary artery of native heart with unstable angina pectoris (HCC)   2. Carotid bruit, unspecified laterality   3. First degree AV block   4. Essential hypertension   5. Hypothyroidism, unspecified type   6. Weight loss   7. Kidney stone     ASSESSMENT AND PLAN: Dale [name] is an 84 years old gentleman who is underwent initial intervention to his RCA in 2000 and subsequent intervention in 2009. At last catheterization in 2013 his RCA stents were patent and he had mild 30% LAD narrowing.  Marland Kitchen  His blood pressure is well controlled today on his regimen consisting of amlodipine 5 mg, metoprolol 12.5 mg twice a day in addition to his isosorbide mononitrate.  He also has a prescription for HCTZ which he takes on an as-needed basis for leg swelling.  He is not having any increasing episodes of chest  pain.  He notes rare chest pain.  He continues to be on isosorbide.  His ECG does not show ischemic changes.  His echo Doppler study in August 2018 showed normal EF at 55-60% without regional wall motion abnormalities.  Normal diastolic parameters.  He has history of hypothyroidism and continues to be on levothyroxine 100 g.  He continues to be on dual antiplatelet therapy with aspirin and Plavix and denies bleeding.  He recently underwent procedure to remove a left kidney stone as noted above.Marland Kitchen  He is being followed by Dr. Buford Dresser for his weight loss and GERD.  As long as he remains stable I will see him in one year for reevaluation.  Time spent: 25 minutes  Troy Sine, MD, [facility]  07/24/2017 12:57 PM

## 2017-07-24 NOTE — Patient Instructions (Signed)
Medication Instructions:  Your physician recommends that you continue on your current medications as directed. Please refer to the Current Medication list given to you today.  Follow-Up: Your physician wants you to follow-up in: 12 months with Dr. Kelly. You will receive a reminder letter in the mail two months in advance. If you don't receive a letter, please call our office to schedule the follow-up appointment.   Any Other Special Instructions Will Be Listed Below (If Applicable).     If you need a refill on your cardiac medications before your next appointment, please call your pharmacy.  ' 

## 2017-07-28 ENCOUNTER — Other Ambulatory Visit: Payer: Self-pay | Admitting: Urology

## 2017-07-28 ENCOUNTER — Telehealth: Payer: Self-pay | Admitting: Cardiovascular Disease

## 2017-07-28 NOTE — Telephone Encounter (Signed)
Returned the call to the patient's wife per the dpr. She stated that the patient has been having swelling in his feet and ankles. They tend to get better overnight. He denies shortness of breath. He is currently on a low sodium diet and wears compression hose during the day.   He has been taking the HCTZ daily instead of prn. He has been instructed to elevate his legs throughout the day and to call back if he does not see any relief from this. Message routed to the provider for any further recommendation.

## 2017-07-28 NOTE — Telephone Encounter (Signed)
.    He has a prescription for HCTZ.   If he has swelling, recommend taking HCTZ on consecutive days until the swelling is resolved and then as needed

## 2017-07-28 NOTE — Telephone Encounter (Signed)
New message   Pt c/o swelling: STAT is pt has developed SOB within 24 hours  1) How much weight have you gained and in what time span?N/A  2) If swelling, where is the swelling located? FEET, ANKLES  3) Are you currently taking a fluid pill? YES  4) Are you currently SOB? NO  5) Do you have a log of your daily weights (if so, list)? NO  6) Have you gained 3 pounds in a day or 5 pounds in a week? N/A  7) Have you traveled recently? NO

## 2017-07-30 NOTE — Telephone Encounter (Signed)
If he is taking HCTZ 12.5 mg daily and still having edema can increase to 25 mg daily or alternate every other day with one half dose depending upon leg swelling

## 2017-07-31 NOTE — Telephone Encounter (Signed)
Returned call to wife. Explained MD recommendations. Advised hctz 25mg  QD for 2-3 days and then alternating schedule of 12.5mg  and 25mg  per MD advice until swelling resolves.

## 2017-07-31 NOTE — Telephone Encounter (Signed)
New message ° °Pt wife verbalized that she is returning call for RN °

## 2017-08-05 ENCOUNTER — Ambulatory Visit (HOSPITAL_COMMUNITY)
Admission: RE | Admit: 2017-08-05 | Discharge: 2017-08-05 | Disposition: A | Discharge status: Home / Self Care | Payer: Medicare Other | Source: Ambulatory Visit | Attending: Urology | Admitting: Urology

## 2017-08-05 DIAGNOSIS — N1330 Unspecified hydronephrosis: Secondary | ICD-10-CM | POA: Insufficient documentation

## 2017-08-05 DIAGNOSIS — N201 Calculus of ureter: Secondary | ICD-10-CM

## 2017-08-12 ENCOUNTER — Other Ambulatory Visit: Payer: Self-pay | Admitting: Cardiovascular Disease

## 2017-08-12 NOTE — Telephone Encounter (Signed)
REFILL 

## 2017-08-12 NOTE — Telephone Encounter (Signed)
°*  STAT* If patient is at the pharmacy, call can be transferred to refill team.   1. Which medications need to be refilled? (please list name of each medication and dose if known) Isosorbide  2. Which pharmacy/location (including street and city if local pharmacy) is medication to be sent to?[facility] 3. Do they need a 30 day or 90 day supply?135 and refills

## 2017-08-19 ENCOUNTER — Ambulatory Visit (INDEPENDENT_AMBULATORY_CARE_PROVIDER_SITE_OTHER): Payer: Medicare Other | Admitting: Urology

## 2017-08-19 DIAGNOSIS — M542 Cervicalgia: Secondary | ICD-10-CM | POA: Diagnosis not present

## 2017-08-19 DIAGNOSIS — N130 Hydronephrosis with ureteropelvic junction obstruction: Secondary | ICD-10-CM

## 2017-08-19 DIAGNOSIS — C642 Malignant neoplasm of left kidney, except renal pelvis: Secondary | ICD-10-CM

## 2017-08-19 DIAGNOSIS — Z681 Body mass index (BMI) 19 or less, adult: Secondary | ICD-10-CM | POA: Diagnosis not present

## 2017-08-26 DIAGNOSIS — H401121 Primary open-angle glaucoma, left eye, mild stage: Secondary | ICD-10-CM | POA: Diagnosis not present

## 2017-08-26 DIAGNOSIS — H401112 Primary open-angle glaucoma, right eye, moderate stage: Secondary | ICD-10-CM | POA: Diagnosis not present

## 2017-09-12 DIAGNOSIS — Z8679 Personal history of other diseases of the circulatory system: Secondary | ICD-10-CM | POA: Diagnosis not present

## 2017-09-12 DIAGNOSIS — E039 Hypothyroidism, unspecified: Secondary | ICD-10-CM | POA: Diagnosis not present

## 2017-09-12 DIAGNOSIS — Z681 Body mass index (BMI) 19 or less, adult: Secondary | ICD-10-CM | POA: Diagnosis not present

## 2017-09-12 DIAGNOSIS — N400 Enlarged prostate without lower urinary tract symptoms: Secondary | ICD-10-CM | POA: Diagnosis not present

## 2017-09-12 DIAGNOSIS — R197 Diarrhea, unspecified: Secondary | ICD-10-CM | POA: Diagnosis not present

## 2017-09-12 DIAGNOSIS — E063 Autoimmune thyroiditis: Secondary | ICD-10-CM | POA: Diagnosis not present

## 2017-09-12 DIAGNOSIS — C642 Malignant neoplasm of left kidney, except renal pelvis: Secondary | ICD-10-CM | POA: Diagnosis not present

## 2017-09-12 DIAGNOSIS — D649 Anemia, unspecified: Secondary | ICD-10-CM | POA: Diagnosis not present

## 2017-09-12 DIAGNOSIS — Z79899 Other long term (current) drug therapy: Secondary | ICD-10-CM | POA: Diagnosis not present

## 2017-09-12 DIAGNOSIS — R55 Syncope and collapse: Secondary | ICD-10-CM | POA: Diagnosis not present

## 2017-09-12 DIAGNOSIS — K219 Gastro-esophageal reflux disease without esophagitis: Secondary | ICD-10-CM | POA: Diagnosis not present

## 2017-09-25 ENCOUNTER — Other Ambulatory Visit: Payer: Medicare Other

## 2017-09-25 ENCOUNTER — Other Ambulatory Visit: Payer: Self-pay | Admitting: Radiology

## 2017-09-25 ENCOUNTER — Other Ambulatory Visit (HOSPITAL_COMMUNITY): Payer: Self-pay | Admitting: Interventional Radiology

## 2017-09-25 ENCOUNTER — Encounter: Payer: Self-pay | Admitting: Radiology

## 2017-09-25 DIAGNOSIS — C642 Malignant neoplasm of left kidney, except renal pelvis: Secondary | ICD-10-CM

## 2017-09-30 DIAGNOSIS — M542 Cervicalgia: Secondary | ICD-10-CM | POA: Diagnosis not present

## 2017-09-30 DIAGNOSIS — Z681 Body mass index (BMI) 19 or less, adult: Secondary | ICD-10-CM | POA: Diagnosis not present

## 2017-10-04 ENCOUNTER — Other Ambulatory Visit: Payer: Self-pay | Admitting: Cardiovascular Disease

## 2017-10-14 ENCOUNTER — Ambulatory Visit (HOSPITAL_COMMUNITY)
Admission: RE | Admit: 2017-10-14 | Discharge: 2017-10-14 | Disposition: A | Discharge status: Home / Self Care | Payer: Medicare Other | Source: Ambulatory Visit | Attending: Interventional Radiology | Admitting: Interventional Radiology

## 2017-10-14 DIAGNOSIS — C642 Malignant neoplasm of left kidney, except renal pelvis: Secondary | ICD-10-CM

## 2017-10-14 DIAGNOSIS — N281 Cyst of kidney, acquired: Secondary | ICD-10-CM | POA: Insufficient documentation

## 2017-10-14 DIAGNOSIS — K7689 Other specified diseases of liver: Secondary | ICD-10-CM | POA: Diagnosis not present

## 2017-10-14 LAB — POCT I-STAT CREATININE: Creatinine, Ser: 1 mg/dL (ref 0.61–1.24)

## 2017-10-14 MED ORDER — IOPAMIDOL (ISOVUE-300) INJECTION 61%
100.0000 mL | Freq: Once | INTRAVENOUS | Status: AC | PRN
  Administered 2017-10-14: 100 mL via INTRAVENOUS

## 2017-10-15 ENCOUNTER — Ambulatory Visit
Admission: RE | Admit: 2017-10-15 | Discharge: 2017-10-15 | Disposition: A | Discharge status: Home / Self Care | Payer: Medicare Other | Source: Ambulatory Visit | Attending: Interventional Radiology | Admitting: Interventional Radiology

## 2017-10-15 ENCOUNTER — Encounter: Payer: Self-pay | Admitting: Radiology

## 2017-10-15 DIAGNOSIS — N1330 Unspecified hydronephrosis: Secondary | ICD-10-CM | POA: Diagnosis not present

## 2017-10-15 DIAGNOSIS — C642 Malignant neoplasm of left kidney, except renal pelvis: Secondary | ICD-10-CM

## 2017-10-15 HISTORY — PX: IR RADIOLOGIST EVAL & MGMT: IMG5224

## 2017-10-15 NOTE — Progress Notes (Signed)
Chief Complaint: Status post cryoablation of a left clear-cell renal carcinoma on 05/17/2016.  History of Present Illness: Dale [name] is a 84 y.o. 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  Initial post procedural CT on 10/01/2016 showed an appropriate ablation defect without residual enhancing tumor.  He tolerated the procedure very well.  He did develop hematuria this January with CT demonstrating a calculus in the mid left ureter.  He underwent laser lithotripsy and extraction of the ureteral calculus by Dr. [name] on 06/16/2017.  A ureteral stent was placed at that time and subsequently removed.  Hematuria has resolved.  He has no symptoms currently.  He did have evidence of some hydronephrosis of the left kidney by ultrasound on 08/05/2017.  Past Medical History:  Diagnosis Date  . Allergic rhinitis   . Anal fissure   . Anginal pain ([place])   . Asthma   . Cancer ([place])    skin  . Chest pain   . CHF (congestive heart failure) ([place])   . Coronary heart disease    s/p stenting. cath in 01/2012 noncritical occlusion  . Dysrhythmia    1st degree heart block  . GERD (gastroesophageal reflux disease)   . Glaucoma   . Hiatal hernia   . Hyperlipidemia   . Hypertension   . Hypothyroidism   . Idiopathic thrombocytopenic purpura ([place]) 2002  . Macular degeneration   . Nephrolithiasis   . PUD (peptic ulcer disease)    remote  . Sarcoidosis    pulmonary  . Schatzki's ring     Past Surgical History:  Procedure Laterality Date  . cardiac stents    . COLONOSCOPY  10/30/2006   Normal rectum, sigmoid diverticula.Remainder of colonic mucosa appeared normal.  . CORONARY ANGIOPLASTY WITH STENT PLACEMENT     about 10 years ago per pt (around 2007)  . CYSTOSCOPY WITH RETROGRADE PYELOGRAM, URETEROSCOPY AND STENT PLACEMENT Left 06/16/2017   Procedure: CYSTOSCOPY WITH RETROGRADE PYELOGRAM,  URETEROSCOPY,STONE EXTRACTION  AND STENT PLACEMENT;  Surgeon: Franchot Gallo, MD;  Location: [facility];  Service: Urology;  Laterality: Left;  . ESOPHAGOGASTRODUODENOSCOPY  06/19/2004   Two esophageal rings and esophageal web as described above.  All of these were disrupted by passing 56-French Venia Minks dilator/ Candida esophagitis,which appears to be incidental given history of   antibiotic use, but nevertheless will be treated.  . ESOPHAGOGASTRODUODENOSCOPY  10/30/2006   Distal tandem esophageal ring status post dilation disruption as  described above.  Otherwise normal esophagus/  Small hiatal hernia otherwise normal stomach, D1 and D2  . ESOPHAGOGASTRODUODENOSCOPY N/A 03/22/2015   Dr.Rourk- noncritical schatzki's ring and hiatal hernia-o/w normal EGD.   Marland Kitchen ESOPHAGOGASTRODUODENOSCOPY (EGD) WITH ESOPHAGEAL DILATION  03/04/2012   RMR- schatzki's ring, hiatal hernia, polypoid gastric mucosa, bx= minimally active gastritis.  Marland Kitchen HOLMIUM LASER APPLICATION Left 1/61/0960   Procedure: HOLMIUM LASER APPLICATION;  Surgeon: Franchot Gallo, MD;  Location: [facility];  Service: Urology;  Laterality: Left;  . IR GENERIC HISTORICAL  03/06/2016   IR RADIOLOGIST EVAL & MGMT 03/06/2016 Aletta Edouard, MD [facility] INTERV RAD  . IR GENERIC HISTORICAL  06/18/2016   IR RADIOLOGIST EVAL & MGMT 06/18/2016 Aletta Edouard, MD [facility] INTERV RAD  . IR RADIOLOGIST EVAL & MGMT  10/01/2016  . LEFT HEART CATH N/A 02/02/2012   Procedure: LEFT HEART CATH;  Surgeon: Lorretta Harp, MD;  Location: [facility] CATH LAB;  Service: Cardiovascular;  Laterality: N/A;  . MEDIASTINOSCOPY     for dx sarcoid  . RADIOLOGY WITH ANESTHESIA Left 05/17/2016   Procedure: left renal ablation;  Surgeon: Aletta Edouard, MD;  Location: [facility];  Service: Radiology;  Laterality: Left;    Allergies: Azithromycin; Doxazosin; Acetaminophen; Atenolol; Hydrocodone; Hydrocodone-acetaminophen; Levofloxacin; Morphine; Penicillins; and Sulfonamide  derivatives  Medications: Prior to Admission medications   Medication Sig Start Date End Date Taking? Authorizing Provider  acetaZOLAMIDE (DIAMOX) 250 MG tablet Take 250 mg by mouth 2 (two) times daily.   Yes [provider]  amLODipine (NORVASC) 5 MG tablet Take 1 tablet (5 mg total) by mouth daily. 01/13/17  Yes Troy Sine, MD  aspirin 81 MG tablet Take 81 mg by mouth 3 (three) times a week.    Yes [provider]  atorvastatin (LIPITOR) 40 MG tablet Take one (1) tablet (40 mg) by mouth every evening. 03/21/17  Yes Troy Sine, MD  clopidogrel (PLAVIX) 75 MG tablet Take 1 tablet (75 mg total) by mouth daily. 03/31/17  Yes Troy Sine, MD  dorzolamide (TRUSOPT) 2 % ophthalmic solution Place 1 drop into both eyes 2 (two) times daily.    Yes [provider]  finasteride (PROSCAR) 5 MG tablet Take 5 mg by mouth daily at 12 noon.    Yes [provider]  guaiFENesin (MUCINEX) 600 MG 12 hr tablet Take 1,200 mg by mouth daily.    Yes [provider]  hydrochlorothiazide (HYDRODIURIL) 12.5 MG tablet TAKE 1 TABLET BY MOUTH ONCE A DAY AS NEEDED FOR FLUID  RETENTION Patient taking differently: Take 12.5 mg by mouth once daily 03/20/17  Yes Troy Sine, MD  isosorbide mononitrate (IMDUR) 60 MG 24 hr tablet TAKE 1 TABLET BY MOUTH IN  THE MORNING AND 1/2 TABLET  AT NIGHT 08/12/17  Yes Troy Sine, MD  latanoprost (XALATAN) 0.005 % ophthalmic solution Place 1 drop into both eyes at bedtime.   Yes [provider]  levothyroxine (SYNTHROID, LEVOTHROID) 100 MCG tablet TAKE 1 TABLET BY MOUTH  DAILY 07/16/17  Yes Troy Sine, MD  losartan (COZAAR) 100 MG tablet TAKE 1 TABLET BY MOUTH  DAILY Patient taking differently: TAKE 100 MG BY MOUTH  DAILY 03/20/17  Yes Troy Sine, MD  metoprolol tartrate (LOPRESSOR) 25 MG tablet TAKE ONE-HALF TABLET BY  MOUTH TWICE A DAY 10/07/17  Yes Troy Sine, MD  mometasone (ELOCON) 0.1 % ointment Apply 1  application topically daily.    Yes [provider]  Multiple Vitamin (MULTIVITAMIN) tablet Take 1 tablet by mouth every evening.    Yes [provider]  Multiple Vitamins-Minerals (PRESERVISION/LUTEIN) CAPS Take 1 capsule by mouth 2 (two) times daily.    Yes [provider]  nitroGLYCERIN (NITROSTAT) 0.4 MG SL tablet Place 1 tablet (0.4 mg total) under the tongue every 5 (five) minutes as needed. For chest pain Patient taking differently: Place 0.4 mg under the tongue every 5 (five) minutes as needed for chest pain.  03/14/16  Yes Troy Sine, MD  ondansetron (ZOFRAN ODT) 4 MG disintegrating tablet 11m ODT q4 hours prn nausea/vomit 05/11/13  Yes ZElnora Morrison MD  pantoprazole (PROTONIX) 40 MG tablet Take 1 tablet (40 mg total) by mouth 2 (two) times daily before a meal. 02/10/17  Yes BAnnitta Needs NP  potassium chloride (K-DUR,KLOR-CON) 10 MEQ tablet TAKE 1 TABLET BY MOUTH  DAILY 07/16/17  Yes KTroy Sine MD  timolol (TIMOPTIC) 0.5 % ophthalmic solution  Place 1 drop into both eyes daily.    Yes [provider]  triamcinolone (KENALOG) 0.1 % cream Apply 1 application topically 2 (two) times daily as needed (for irritation).    Yes [provider]  vitamin C (ASCORBIC ACID) 500 MG tablet Take 500 mg by mouth daily.    Yes [provider]  vitamin E 200 UNIT capsule Take 200 Units by mouth daily at 12 noon.    Yes [provider]  cephALEXin (KEFLEX) 500 MG capsule Take 1 capsule (500 mg total) by mouth 2 (two) times daily. Patient not taking: Reported on 10/15/2017 06/16/17   Franchot Gallo, MD  potassium chloride (K-DUR) 10 MEQ tablet Take 1 tablet (10 mEq total) by mouth daily. 10/16/12 12/20/13  Troy Sine, MD     Family History  Problem Relation Age of Onset  . Heart disease Father        deceased age 22  . Stroke Mother   . Alzheimer's disease Mother   . Heart attack Brother        deceased age 92  . Cancer Other         niece  . Colon cancer Neg Hx     Social History   Socioeconomic History  . Marital status: Married    Spouse name: Not on file  . Number of children: 1  . Years of education: Not on file  . Highest education level: Not on file  Occupational History  . 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 Financial resource strain: Not on file  . Food insecurity:    Worry: Not on file    Inability: Not on file  . Transportation needs:    Medical: Not on file    Non-medical: Not on file  Tobacco Use  . Smoking status: Former Smoker    Packs/day: 0.10    Years: 2.00    Pack years: 0.20    Types: Cigarettes, Cigars    Last attempt to quit: 05/06/1970    Years since quitting: 47.4  . Smokeless tobacco: Never Used  Substance and Sexual Activity  . Alcohol use: No    Alcohol/week: 0.0 oz  . Drug use: No  . Sexual activity: Never  Lifestyle  . Physical activity:    Days per week: Not on file    Minutes per session: Not on file  . Stress: Not on file  Relationships  . Social connections:    Talks on phone: Not on file    Gets together: Not on file    Attends religious service: Not on file    Active member of club or organization: Not on file    Attends meetings of clubs or organizations: Not on file    Relationship status: Not on file  Other Topics Concern  . Not on file  Social History Narrative  . Not on file    ECOG Status: 0 - Asymptomatic  Review of Systems: A 12 point ROS discussed and pertinent positives are indicated in the HPI above.  All other systems are negative.  Review of Systems  Constitutional: Negative.   Respiratory: Negative.   Cardiovascular: Negative.   Gastrointestinal: Negative.   Genitourinary: Negative.   Musculoskeletal: Negative.   Neurological: Negative.     Vital Signs: BP (!) 152/65   Pulse 69   Temp (!) 97.4 F (36.3 C) (Oral)   Resp 14  Ht '5\' 8"'  (1.727 m)   Wt 125 lb  (56.7 kg)   SpO2 96%   BMI 19.01 kg/m   Physical Exam  Constitutional: He is oriented to person, place, and time. No distress.  Abdominal: Soft. He exhibits no distension. There is no tenderness. There is no rebound and no guarding.  Musculoskeletal: He exhibits no edema.  Neurological: He is alert and oriented to person, place, and time.  Skin: He is not diaphoretic.  Vitals reviewed.   Imaging: Ct Abdomen W Wo Contrast  Result Date: 10/15/2017 CLINICAL DATA:  Renal cell carcinoma. Status post cryo ablation on 05/17/2016. EXAM: CT ABDOMEN WITHOUT AND WITH CONTRAST TECHNIQUE: Multidetector CT imaging of the abdomen was performed following the standard protocol before and following the bolus administration of intravenous contrast. CONTRAST:  171m ISOVUE-300 IOPAMIDOL (ISOVUE-300) INJECTION 61% COMPARISON:  06/02/2017. FINDINGS: Lower chest: Unremarkable. Hepatobiliary: Stable 2.7 cm cyst in the lateral segment left liver. Other scattered tiny hypoattenuating liver lesions are also likely cysts. There is no evidence for gallstones, gallbladder wall thickening, or pericholecystic fluid. No intrahepatic or extrahepatic biliary dilation. Pancreas: 7 mm cystic lesion in the tail of pancreas (image 29/series 4) [email or web address] Is stable since prior study and measured 5 mm on 10/01/2016. known main duct dilatation. Spleen: No splenomegaly. No focal mass lesion. Adrenals/Urinary Tract: 9 mm left adrenal nodule (24/4) unchanged since prior study. 2-3 mm nonobstructing stone again identified interpolar right kidney, best appreciated on coronal noncontrast image 53/series 8. 8 mm low-density subcapsular lesion in the lower pole the right kidney is similar to prior and likely a cyst. No right hydronephrosis. Tiny nonobstructing stone again noted lower pole left kidney. Ablation defect identified lateral cortex of the left kidney near the junction of the interpolar region and lower pole. No evidence for new  or progressive soft tissue in the ablation bed and no unexpected or abnormal enhancement evident. Evolving caliceal diverticulum noted at the level of the ablation. No left hydroureter. The segment of the left ureter containing the stone on the prior study is not been included on today's abdomen only exam. Stomach/Bowel: Stomach is nondistended. No gastric wall thickening. No evidence of outlet obstruction. Duodenum is normally positioned as is the ligament of Treitz. No small bowel wall thickening. No small bowel dilatation. No colonic dilatation within the visualized abdomen. Vascular/Lymphatic: There is abdominal aortic atherosclerosis without aneurysm. There is no gastrohepatic or hepatoduodenal ligament lymphadenopathy. No intraperitoneal or retroperitoneal lymphadenopathy. Other: No intraperitoneal free fluid. Musculoskeletal: No worrisome lytic or sclerotic osseous abnormality. Tiny sclerotic focus in the central L1 vertebral body is unchanged. IMPRESSION: 1. Stable appearance ablation defect in the left kidney near the junction of the interpolar region and lower pole. No findings to suggest local recurrence. 2. Tiny bilateral nonobstructing renal stones. The portion of the left ureter containing the stone on the prior study has not been included on today's exam. 3. 7 mm cystic lesion in the tail of pancreas, stable. Attention on follow-up imaging recommended. 4. Hepatic and bilateral renal cysts. 5.  Aortic Atherosclerois (ICD10-170.0) Electronically Signed   By: EMisty StanleyM.D.   On: 10/15/2017 08:41    Labs:  CBC: Recent Labs    01/01/17 1455 01/02/17 0612 04/07/17 1310 05/27/17 1205  WBC 5.8 6.2 5.5 5.3  HGB 11.2* 10.8* 11.4* 11.0*  HCT 34.6* 33.3*  SPHEMO 35.2* 32.4*  PLT 169 182 195 198    COAGS: No results for input(s): INR, APTT in the last 8760 hours.  BMP:  Recent Labs    01/02/17 0612 01/03/17 0300 04/07/17 1310 06/02/17 1704 06/16/17 0800 10/14/17 1616  NA 139 138  135  --  139  --   K 3.3* 3.9 3.4*  --  4.0  --   CL 110 111 107  --  112*  --   CO2 '23 23 22  ' --  21*  --   GLUCOSE 112* 98 114*  --  99  --   BUN 15 20 25*  --  25*  --   CALCIUM 8.7* 9.0 8.9  --  8.9  --   CREATININE 0.71 0.86 0.68 1.00 0.69 1.00  GFRNONAA >60 >60 >60  --  >60  --   GFRAA >60 >60 >60  --  >60  --      Assessment and Plan:  I met with Dale [name] and reviewed a follow-up CT of the abdomen performed earlier today.  This shows further retraction of scar tissue at the level of left renal ablation with no evidence of residual enhancing carcinoma or tumor recurrence.  No new renal lesions are identified.  There is no evidence of left-sided hydronephrosis by CT.  A small cyst along the undersurface of the tail of the pancreas appears stable.  Renal function is stable and Dale [name] is doing very well clinically.  He states that his wife currently is not doing well and is having difficulty ambulating.  I recommended a follow-up CT in 1 year.   Electronically SignedAletta Edouard T 10/15/2017, 10:59 AM     I spent a total of 15 Minutes in face to face in clinical consultation, greater than 50% of which was counseling/coordinating care status post cryoablation of a left renal carcinoma.

## 2017-10-22 DIAGNOSIS — H401121 Primary open-angle glaucoma, left eye, mild stage: Secondary | ICD-10-CM | POA: Diagnosis not present

## 2017-10-22 DIAGNOSIS — Z961 Presence of intraocular lens: Secondary | ICD-10-CM | POA: Diagnosis not present

## 2017-10-22 DIAGNOSIS — H401113 Primary open-angle glaucoma, right eye, severe stage: Secondary | ICD-10-CM | POA: Diagnosis not present

## 2017-11-04 ENCOUNTER — Other Ambulatory Visit: Payer: Self-pay | Admitting: Cardiovascular Disease

## 2017-11-12 DIAGNOSIS — Z961 Presence of intraocular lens: Secondary | ICD-10-CM | POA: Diagnosis not present

## 2017-11-12 DIAGNOSIS — H401113 Primary open-angle glaucoma, right eye, severe stage: Secondary | ICD-10-CM | POA: Diagnosis not present

## 2017-11-12 DIAGNOSIS — H401121 Primary open-angle glaucoma, left eye, mild stage: Secondary | ICD-10-CM | POA: Diagnosis not present

## 2017-12-03 DIAGNOSIS — H401113 Primary open-angle glaucoma, right eye, severe stage: Secondary | ICD-10-CM | POA: Diagnosis not present

## 2017-12-03 DIAGNOSIS — H401121 Primary open-angle glaucoma, left eye, mild stage: Secondary | ICD-10-CM | POA: Diagnosis not present

## 2017-12-16 DIAGNOSIS — H401131 Primary open-angle glaucoma, bilateral, mild stage: Secondary | ICD-10-CM | POA: Diagnosis not present

## 2017-12-16 DIAGNOSIS — H353132 Nonexudative age-related macular degeneration, bilateral, intermediate dry stage: Secondary | ICD-10-CM | POA: Diagnosis not present

## 2017-12-16 DIAGNOSIS — Z961 Presence of intraocular lens: Secondary | ICD-10-CM | POA: Diagnosis not present

## 2018-01-03 ENCOUNTER — Other Ambulatory Visit: Payer: Self-pay | Admitting: Cardiovascular Disease

## 2018-01-28 ENCOUNTER — Telehealth: Payer: Self-pay | Admitting: Cardiovascular Disease

## 2018-01-28 NOTE — Telephone Encounter (Signed)
New message   Pt c/o swelling: STAT is pt has developed SOB within 24 hours  1) How much weight have you gained and in what time span? No   2) If swelling, where is the swelling located? Both feet  3) Are you currently taking a fluid pill? Yes   4) Are you currently SOB? No   5) Do you have a log of your daily weights (if so, list)? No   6) Have you gained 3 pounds in a day or 5 pounds in a week? No   7) Have you traveled recently? no

## 2018-01-28 NOTE — Telephone Encounter (Signed)
Returned call to patient's wife.She stated husband needs appointment.Stated he has swelling in both feet.No sob.Stated he has lost weight.Appointment scheduled with [name] PA 02/03/18 1:30 pm.

## 2018-02-03 ENCOUNTER — Encounter: Payer: Self-pay | Admitting: Physician Assistant

## 2018-02-03 ENCOUNTER — Ambulatory Visit (INDEPENDENT_AMBULATORY_CARE_PROVIDER_SITE_OTHER): Payer: Medicare Other | Admitting: Physician Assistant

## 2018-02-03 VITALS — BP 136/62 | HR 70 | Ht 68.0 in | Wt 130.8 lb

## 2018-02-03 DIAGNOSIS — E785 Hyperlipidemia, unspecified: Secondary | ICD-10-CM

## 2018-02-03 DIAGNOSIS — I25110 Atherosclerotic heart disease of native coronary artery with unstable angina pectoris: Secondary | ICD-10-CM

## 2018-02-03 DIAGNOSIS — E039 Hypothyroidism, unspecified: Secondary | ICD-10-CM | POA: Diagnosis not present

## 2018-02-03 DIAGNOSIS — I2510 Atherosclerotic heart disease of native coronary artery without angina pectoris: Secondary | ICD-10-CM | POA: Diagnosis not present

## 2018-02-03 DIAGNOSIS — I10 Essential (primary) hypertension: Secondary | ICD-10-CM

## 2018-02-03 DIAGNOSIS — M7989 Other specified soft tissue disorders: Secondary | ICD-10-CM

## 2018-02-03 DIAGNOSIS — D869 Sarcoidosis, unspecified: Secondary | ICD-10-CM | POA: Diagnosis not present

## 2018-02-03 MED ORDER — FUROSEMIDE 20 MG PO TABS: 20.0000 mg | ORAL_TABLET | Freq: Every day | ORAL | 1 refills | Status: DC

## 2018-02-03 NOTE — Progress Notes (Signed)
Cardiology Office Note    Date:  02/05/2018   ID:  Dale [name], DOB [birth year], MRN [id number]  PCP:  Redmond School, MD  Cardiologist:  [name]   Chief Complaint  Patient presents with  . Follow-up    seen for [name].     History of Present Illness:  Dale [name] is a 84 y.o. male with PMH of CAD, HLD, HTN, hypothyroidism, ITP, and sarcoidosis.  Patient initially underwent intervention to RCA in 2000.  In 2009, a DES was placed in acute marginal branch of his RCA.  Last cardiac catheterization in September 2013 showed 30% LAD narrowing after the second diagonal, normal left circumflex artery and a patent RCA stents.  Echocardiogram obtained on 12/23/2016 showed EF 55 to 60%.  [facility]  Myoview did not show reversible ischemia.  He was found to have acute sinusitis and was treated with antibiotic.  He also had a history of renal cell cancer.  Last carotid Doppler in October 2018 showed mild plaque bilaterally 1 to 39%.  He was found to have gross hematuria and the had a kidney stone and underwent cystoscopy, left ureteral pyelogram with fluoroscopic interpretation, laser lithotripsy with extraction in February 2019.  He was last seen by [name] in March 2019, he was doing well at the time.  Patient presents today for cardiology office visit and evaluation for lower extremity edema.  For the past several months, he has been having bilateral ankle edema.  He says instead of as needed basis, he has been taking hydrochlorothiazide 12.5 mg daily for the past year.  I will switch his hydrochlorothiazide to Lasix 20 mg daily.  I also instructed the patient to elevate the leg during sleep and when he sits down.  He will need 1 week basic metabolic panel and follow-up with me in 2 weeks for reassessment.  Otherwise he denies any recent chest pain.    Past Medical History:  Diagnosis Date  . Allergic rhinitis   . Anal fissure   . Anginal  pain ([place])   . Asthma   . Cancer ([place])    skin  . Chest pain   . CHF (congestive heart failure) ([place])   . Coronary heart disease    s/p stenting. cath in 01/2012 noncritical occlusion  . Dysrhythmia    1st degree heart block  . GERD (gastroesophageal reflux disease)   . Glaucoma   . Hiatal hernia   . Hyperlipidemia   . Hypertension   . Hypothyroidism   . Idiopathic thrombocytopenic purpura ([place]) 2002  . Macular degeneration   . Nephrolithiasis   . PUD (peptic ulcer disease)    remote  . Sarcoidosis    pulmonary  . Schatzki's ring     Past Surgical History:  Procedure Laterality Date  . cardiac stents    . COLONOSCOPY  10/30/2006   Normal rectum, sigmoid diverticula.Remainder of colonic mucosa appeared normal.  . CORONARY ANGIOPLASTY WITH STENT PLACEMENT     about 10 years ago per pt (around 2007)  . CYSTOSCOPY WITH RETROGRADE PYELOGRAM, URETEROSCOPY AND STENT PLACEMENT Left 06/16/2017   Procedure: CYSTOSCOPY WITH RETROGRADE PYELOGRAM, URETEROSCOPY,STONE EXTRACTION  AND STENT PLACEMENT;  Surgeon: Franchot Gallo, MD;  Location: [facility];  Service: Urology;  Laterality: Left;  . ESOPHAGOGASTRODUODENOSCOPY  06/19/2004   Two esophageal rings and esophageal web as described above.  All of these were disrupted by passing 56-French Venia Minks dilator/ Candida esophagitis,which  appears to be incidental given history of   antibiotic use, but nevertheless will be treated.  . ESOPHAGOGASTRODUODENOSCOPY  10/30/2006   Distal tandem esophageal ring status post dilation disruption as  described above.  Otherwise normal esophagus/  Small hiatal hernia otherwise normal stomach, D1 and D2  . ESOPHAGOGASTRODUODENOSCOPY N/A 03/22/2015   Dr.Rourk- noncritical schatzki's ring and hiatal hernia-o/w normal EGD.   Marland Kitchen ESOPHAGOGASTRODUODENOSCOPY (EGD) WITH ESOPHAGEAL DILATION  03/04/2012   RMR- schatzki's ring, hiatal hernia, polypoid gastric mucosa, bx= minimally active gastritis.  Marland Kitchen HOLMIUM LASER  APPLICATION Left 7/37/1062   Procedure: HOLMIUM LASER APPLICATION;  Surgeon: Franchot Gallo, MD;  Location: [facility];  Service: Urology;  Laterality: Left;  . IR GENERIC HISTORICAL  03/06/2016   IR RADIOLOGIST EVAL & MGMT 03/06/2016 Aletta Edouard, MD [facility] INTERV RAD  . IR GENERIC HISTORICAL  06/18/2016   IR RADIOLOGIST EVAL & MGMT 06/18/2016 Aletta Edouard, MD [facility] INTERV RAD  . IR RADIOLOGIST EVAL & MGMT  10/01/2016  . IR RADIOLOGIST EVAL & MGMT  10/15/2017  . LEFT HEART CATH N/A 02/02/2012   Procedure: LEFT HEART CATH;  Surgeon: Lorretta Harp, MD;  Location: [facility] CATH LAB;  Service: Cardiovascular;  Laterality: N/A;  . MEDIASTINOSCOPY     for dx sarcoid  . RADIOLOGY WITH ANESTHESIA Left 05/17/2016   Procedure: left renal ablation;  Surgeon: Aletta Edouard, MD;  Location: [facility];  Service: Radiology;  Laterality: Left;    Current Medications: Outpatient Medications Prior to Visit  Medication Sig Dispense Refill  . acetaZOLAMIDE (DIAMOX) 250 MG tablet Take 250 mg by mouth daily.     Marland Kitchen amLODipine (NORVASC) 5 MG tablet TAKE 1 TABLET BY MOUTH  DAILY 90 tablet 1  . aspirin 81 MG tablet Take 81 mg by mouth 3 (three) times a week.     Marland Kitchen atorvastatin (LIPITOR) 40 MG tablet Take one (1) tablet (40 mg) by mouth every evening. 90 tablet 3  . clopidogrel (PLAVIX) 75 MG tablet Take 1 tablet (75 mg total) by mouth daily. 90 tablet 3  . dorzolamide (TRUSOPT) 2 % ophthalmic solution Place 1 drop into both eyes 2 (two) times daily.     . finasteride (PROSCAR) 5 MG tablet Take 5 mg by mouth daily at 12 noon.     Marland Kitchen guaiFENesin (MUCINEX) 600 MG 12 hr tablet Take 1,200 mg by mouth daily.     . isosorbide mononitrate (IMDUR) 60 MG 24 hr tablet TAKE 1 TABLET BY MOUTH IN  THE MORNING AND 1/2 TABLET  AT NIGHT 135 tablet 3  . latanoprost (XALATAN) 0.005 % ophthalmic solution Place 1 drop into both eyes at bedtime.    Marland Kitchen levothyroxine (SYNTHROID, LEVOTHROID) 100 MCG tablet TAKE 1 TABLET BY MOUTH  DAILY 90 tablet 3   . losartan (COZAAR) 100 MG tablet TAKE 1 TABLET BY MOUTH  DAILY (Patient taking differently: TAKE 100 MG BY MOUTH  DAILY) 90 tablet 3  . metoprolol tartrate (LOPRESSOR) 25 MG tablet TAKE ONE-HALF TABLET BY  MOUTH TWICE A DAY 90 tablet 3  . mometasone (ELOCON) 0.1 % ointment Apply 1 application topically daily.     . Multiple Vitamin (MULTIVITAMIN) tablet Take 1 tablet by mouth every evening.     . Multiple Vitamins-Minerals (PRESERVISION/LUTEIN) CAPS Take 1 capsule by mouth 2 (two) times daily.     . nitroGLYCERIN (NITROSTAT) 0.4 MG SL tablet Place 1 tablet (0.4 mg total) under the tongue every 5 (five) minutes as needed. For chest pain (Patient taking differently:  Place 0.4 mg under the tongue every 5 (five) minutes as needed for chest pain. ) 25 tablet 3  . pantoprazole (PROTONIX) 40 MG tablet Take 1 tablet (40 mg total) by mouth 2 (two) times daily before a meal. 180 tablet 2  . potassium chloride (K-DUR,KLOR-CON) 10 MEQ tablet TAKE 1 TABLET BY MOUTH  DAILY 90 tablet 1  . timolol (TIMOPTIC) 0.5 % ophthalmic solution Place 1 drop into both eyes daily.     Marland Kitchen triamcinolone (KENALOG) 0.1 % cream Apply 1 application topically 2 (two) times daily as needed (for irritation).     . vitamin C (ASCORBIC ACID) 500 MG tablet Take 500 mg by mouth daily.     . vitamin E 200 UNIT capsule Take 200 Units by mouth daily at 12 noon.     . hydrochlorothiazide (HYDRODIURIL) 12.5 MG tablet TAKE 1 TABLET BY MOUTH ONCE A DAY AS NEEDED FOR FLUID  RETENTION (Patient taking differently: Take 12.5 mg by mouth once daily) 90 tablet 3  . cephALEXin (KEFLEX) 500 MG capsule Take 1 capsule (500 mg total) by mouth 2 (two) times daily. (Patient not taking: Reported on 10/15/2017) 6 capsule 0  . ondansetron (ZOFRAN ODT) 4 MG disintegrating tablet 4mg  ODT q4 hours prn nausea/vomit 4 tablet 0  . potassium chloride (K-DUR) 10 MEQ tablet Take 1 tablet (10 mEq total) by mouth daily. 90 tablet 3   No facility-administered medications  prior to visit.      Allergies:   Azithromycin; Doxazosin; Acetaminophen; Atenolol; Hydrocodone; Hydrocodone-acetaminophen; Levofloxacin; Morphine; Penicillins; and Sulfonamide derivatives   Social History   Socioeconomic History  . Marital status: Married    Spouse name: Not on file  . Number of children: 1  . Years of education: Not on file  . Highest education level: Not on file  Occupational History  . 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 Financial resource strain: Not on file  . Food insecurity:    Worry: Not on file    Inability: Not on file  . Transportation needs:    Medical: Not on file    Non-medical: Not on file  Tobacco Use  . Smoking status: Former Smoker    Packs/day: 0.10    Years: 2.00    Pack years: 0.20    Types: Cigarettes, Cigars    Last attempt to quit: 05/06/1970    Years since quitting: 47.7  . Smokeless tobacco: Never Used  Substance and Sexual Activity  . Alcohol use: No    Alcohol/week: 0.0 standard drinks  . Drug use: No  . Sexual activity: Never  Lifestyle  . Physical activity:    Days per week: Not on file    Minutes per session: Not on file  . Stress: Not on file  Relationships  . Social connections:    Talks on phone: Not on file    Gets together: Not on file    Attends religious service: Not on file    Active member of club or organization: Not on file    Attends meetings of clubs or organizations: Not on file    Relationship status: Not on file  Other Topics Concern  . Not on file  Social History Narrative  . Not on file     Family History:  The patient's family history includes Alzheimer's disease in his mother; Cancer in his other; Heart attack in his brother; Heart disease in his father;  Stroke in his mother.   ROS:   Please see the history of present illness.    ROS All other systems reviewed and are negative.   PHYSICAL EXAM:   VS:  BP 136/62   Pulse 70    Ht 5\' 8"  (1.727 m)   Wt 130 lb 12.8 oz (59.3 kg)   BMI 19.89 kg/m    GEN: Well nourished, well developed, in no acute distress  HEENT: normal  Neck: no JVD, carotid bruits, or masses Cardiac: RRR; no murmurs, rubs, or gallops. 1-2+ pitting edema  Respiratory:  clear to auscultation bilaterally, normal work of breathing GI: soft, nontender, nondistended, + BS MS: no deformity or atrophy  Skin: warm and dry, no rash Neuro:  Alert and Oriented x 3, Strength and sensation are intact Psych: euthymic mood, full affect  Wt Readings from Last 3 Encounters:  02/03/18 130 lb 12.8 oz (59.3 kg)  10/15/17 125 lb (56.7 kg)  07/24/17 127 lb (57.6 kg)      Studies/Labs Reviewed:   EKG:  EKG is not ordered today.    Recent Labs: 05/27/2017: Hemoglobin 11.0; Platelets 198 06/16/2017: BUN 25; Potassium 4.0; Sodium 139 10/14/2017: Creatinine, Ser 1.00   Lipid Panel    Component Value Date/Time   CHOL 98 01/02/2017 0612   CHOL 127 09/08/2015 0933   TRIG 48 01/02/2017 0612   HDL 37 (L) 01/02/2017 0612   HDL 48 09/08/2015 0933   CHOLHDL 2.6 01/02/2017 0612   VLDL 10 01/02/2017 0612   LDLCALC 51 01/02/2017 0612   LDLCALC 61 09/08/2015 0933    Additional studies/ records that were reviewed today include:   Echo 12/23/2016 LV EF: 55% -   60% Study Conclusions  - Left ventricle: The cavity size was normal. Systolic function was   normal. The estimated ejection fraction was in the range of 55%   to 60%. Wall motion was normal; there were no regional wall   motion abnormalities. Left ventricular diastolic function   parameters were normal.   ASSESSMENT:    1. Swelling of lower extremity   2. Coronary artery disease involving native coronary artery of native heart without angina pectoris   3. Essential hypertension   4. Hyperlipidemia, unspecified hyperlipidemia type   5. Hypothyroidism, unspecified type   6. Sarcoidosis      PLAN:  In order of problems listed  above:  1. Lower extremity swelling: Discontinue hydrochlorthiazide and switch to Lasix 20 mg daily.  Obtain basic metabolic panel in 1 week and follow-up with me in 2 weeks  2. CAD: Denies any obvious chest pain.  Continue aspirin and statin  3. Hyperlipidemia: On Lipitor 40 mg daily.  Last lipid panel was obtained in August 2018, due for repeat lab work.  4. Hypertension: Blood pressure stable  5. Hypothyroidism: On Synthroid, managed by primary care provider.    Medication Adjustments/Labs and Tests Ordered: Current medicines are reviewed at length with the patient today.  Concerns regarding medicines are outlined above.  Medication changes, Labs and Tests ordered today are listed in the Patient Instructions below. Patient Instructions  Medication Instructions:  DISCONTINUE Hydrochlorothiazide START Lasix 20mg  Take 1 tablet once a day   If you need a refill on your cardiac medications before your next appointment, please call your pharmacy.  Labwork: Your physician recommends that you return for lab work in: Next Tuesday (02/10/2018) [place] in our office  Testing/Procedures: None   Follow-Up: Your physician recommends that you schedule a  follow-up appointment in: 2 weeks with [name], PA-C  Any Other Special Instructions Will Be Listed Below (If Applicable).          Hilbert Corrigan, [place]  02/05/2018 11:33 PM    [facility] [address], [place], [place]  [address] Phone: [phone]; Fax: [phone]

## 2018-02-03 NOTE — Patient Instructions (Addendum)
Medication Instructions:  DISCONTINUE Hydrochlorothiazide START Lasix 20mg  Take 1 tablet once a day   If you need a refill on your cardiac medications before your next appointment, please call your pharmacy.  Labwork: Your physician recommends that you return for lab work in: Next Tuesday (02/10/2018) [place] in our office  Testing/Procedures: None   Follow-Up: Your physician recommends that you schedule a follow-up appointment in: 2 weeks with [name], PA-C  Any Other Special Instructions Will Be Listed Below (If Applicable).

## 2018-02-05 ENCOUNTER — Encounter: Payer: Self-pay | Admitting: Physician Assistant

## 2018-02-10 DIAGNOSIS — M7989 Other specified soft tissue disorders: Secondary | ICD-10-CM | POA: Diagnosis not present

## 2018-02-11 LAB — BASIC METABOLIC PANEL
BUN/Creatinine Ratio: 27 — ABNORMAL HIGH (ref 10–24)
BUN: 21 mg/dL (ref 8–27)
CO2: 20 mmol/L (ref 20–29)
Calcium: 8.8 mg/dL (ref 8.6–10.2)
Chloride: 107 mmol/L — ABNORMAL HIGH (ref 96–106)
Creatinine, Ser: 0.79 mg/dL (ref 0.76–1.27)
GFR, EST AFRICAN AMERICAN: 93 mL/min/{1.73_m2} (ref >59)
GFR, EST NON AFRICAN AMERICAN: 81 mL/min/{1.73_m2} (ref >59)
Glucose: 105 mg/dL — ABNORMAL HIGH (ref 65–99)
POTASSIUM: 4.1 mmol/L (ref 3.5–5.2)
SODIUM: 141 mmol/L (ref 134–144)

## 2018-02-12 NOTE — Progress Notes (Signed)
Kidney function and electrolyte stable.

## 2018-02-13 ENCOUNTER — Other Ambulatory Visit: Payer: Self-pay | Admitting: Urology

## 2018-02-13 DIAGNOSIS — C641 Malignant neoplasm of right kidney, except renal pelvis: Secondary | ICD-10-CM

## 2018-02-17 ENCOUNTER — Ambulatory Visit (INDEPENDENT_AMBULATORY_CARE_PROVIDER_SITE_OTHER): Payer: Medicare Other | Admitting: Urology

## 2018-02-17 ENCOUNTER — Ambulatory Visit: Payer: Medicare Other | Admitting: Physician Assistant

## 2018-02-17 DIAGNOSIS — C642 Malignant neoplasm of left kidney, except renal pelvis: Secondary | ICD-10-CM

## 2018-02-17 DIAGNOSIS — N6459 Other signs and symptoms in breast: Secondary | ICD-10-CM | POA: Diagnosis not present

## 2018-02-17 DIAGNOSIS — R351 Nocturia: Secondary | ICD-10-CM

## 2018-02-17 DIAGNOSIS — N401 Enlarged prostate with lower urinary tract symptoms: Secondary | ICD-10-CM | POA: Diagnosis not present

## 2018-02-19 ENCOUNTER — Other Ambulatory Visit: Payer: Self-pay | Admitting: Physician Assistant

## 2018-02-19 ENCOUNTER — Ambulatory Visit (INDEPENDENT_AMBULATORY_CARE_PROVIDER_SITE_OTHER): Payer: Medicare Other | Admitting: Physician Assistant

## 2018-02-19 ENCOUNTER — Encounter: Payer: Self-pay | Admitting: Physician Assistant

## 2018-02-19 VITALS — BP 144/68 | HR 62 | Ht 68.0 in | Wt 131.0 lb

## 2018-02-19 DIAGNOSIS — E785 Hyperlipidemia, unspecified: Secondary | ICD-10-CM | POA: Diagnosis not present

## 2018-02-19 DIAGNOSIS — I2510 Atherosclerotic heart disease of native coronary artery without angina pectoris: Secondary | ICD-10-CM

## 2018-02-19 DIAGNOSIS — I10 Essential (primary) hypertension: Secondary | ICD-10-CM

## 2018-02-19 DIAGNOSIS — E039 Hypothyroidism, unspecified: Secondary | ICD-10-CM | POA: Diagnosis not present

## 2018-02-19 DIAGNOSIS — I25110 Atherosclerotic heart disease of native coronary artery with unstable angina pectoris: Secondary | ICD-10-CM | POA: Diagnosis not present

## 2018-02-19 DIAGNOSIS — M25473 Effusion, unspecified ankle: Secondary | ICD-10-CM

## 2018-02-19 MED ORDER — FUROSEMIDE 40 MG PO TABS: 40.0000 mg | ORAL_TABLET | Freq: Every day | ORAL | 0 refills | Status: DC

## 2018-02-19 MED ORDER — POTASSIUM CHLORIDE CRYS ER 20 MEQ PO TBCR: 20.0000 meq | EXTENDED_RELEASE_TABLET | Freq: Every day | ORAL | 2 refills | Status: DC

## 2018-02-19 NOTE — Patient Instructions (Addendum)
Medication Instructions:  INCREASE Lasix to 40mg  Take 1 tablet once day  INCREASE Potassium 79meq take 1 tablet once a day  If you need a refill on your cardiac medications before your next appointment, please call your pharmacy.   Lab work: Your physician recommends that you return for lab work in: [facility] in 1 week  If you have labs (blood work) drawn today and your tests are completely normal, you will receive your results only by: Marland Kitchen MyChart Message (if you have MyChart) OR . A paper copy in the mail If you have any lab test that is abnormal or we need to change your treatment, we will call you to review the results.  Testing/Procedures: None   Follow-Up: At [facility], you and your health needs are our priority.  As part of our continuing mission to provide you with exceptional heart care, we have created designated Provider Care Teams.  These Care Teams include your primary Cardiologist (physician) and Advanced Practice Providers (APPs -  Physician Assistants and Nurse Practitioners) who all work together to provide you with the care you need, when you need it. You will need a follow up appointment in 2-3 months.  Please call our office 2 months in advance to schedule this appointment.  You may see Shelva Majestic, MD or one of the following Advanced Practice Providers on your designated Care Team: Tina, [place] . Fabian Sharp, PA-C  Any Other Special Instructions Will Be Listed Below (If Applicable).

## 2018-02-19 NOTE — Progress Notes (Signed)
Cardiology Office Note    Date:  02/21/2018   ID:  Dale [name], DOB [birth year], MRN [id number]  PCP:  Redmond School, MD  Cardiologist:  [name]   Chief Complaint  Patient presents with  . Follow-up    seen for [name]. Ankle edema    History of Present Illness:  Dale [name] is a 84 y.o. male with PMH of CAD, HLD, HTN, hypothyroidism, ITP, and sarcoidosis.  Patient initially underwent intervention to RCA in 2000.  In 2009, a DES was placed in acute marginal branch of his RCA.  Last cardiac catheterization in September 2013 showed 30% LAD narrowing after the second diagonal, normal left circumflex artery and patent RCA stents.  Echocardiogram obtained on 12/23/2016 showed EF 55 to 60%.  He was hospitalized in August 2018 at [facility] [place] did not show reversible ischemia.  He was found to have acute sinusitis and was treated with antibiotic.  He also had a history of renal cell cancer.  Last carotid Doppler in October 2018 showed mild plaque bilaterally 1 to 39%.  He was found to have gross hematuria and the had a kidney stone and underwent cystoscopy, left ureteral pyelogram with fluoroscopic interpretation, laser lithotripsy with extraction in February 2019.  He was last seen by [name] in March 2019, he was doing well at the time.  I last saw the patient on 02/03/2018 for bilateral ankle edema.  I switched his hydrochlorothiazide to Lasix 20 mg daily.  1 week basic metabolic panel shows stable renal function and electrolyte.  Patient presents today for cardiology office visit.  He continues to have ankle edema.  He says he did not notice significant urine output on the Lasix 20 mg daily.  I will increase Lasix to 40 mg daily as a trial.  I also increased potassium supplement.  He will need a basic metabolic panel in 1 week.  Otherwise he denies any chest pain or shortness of breath.  His lung is clear on exam.    Past Medical History:  Diagnosis Date  . Allergic  rhinitis   . Anal fissure   . Anginal pain ([place])   . Asthma   . Cancer ([place])    skin  . Chest pain   . CHF (congestive heart failure) ([place])   . Coronary heart disease    s/p stenting. cath in 01/2012 noncritical occlusion  . Dysrhythmia    1st degree heart block  . GERD (gastroesophageal reflux disease)   . Glaucoma   . Hiatal hernia   . Hyperlipidemia   . Hypertension   . Hypothyroidism   . Idiopathic thrombocytopenic purpura ([place]) 2002  . Macular degeneration   . Nephrolithiasis   . PUD (peptic ulcer disease)    remote  . Sarcoidosis    pulmonary  . Schatzki's ring     Past Surgical History:  Procedure Laterality Date  . cardiac stents    . COLONOSCOPY  10/30/2006   Normal rectum, sigmoid diverticula.Remainder of colonic mucosa appeared normal.  . CORONARY ANGIOPLASTY WITH STENT PLACEMENT     about 10 years ago per pt (around 2007)  . CYSTOSCOPY WITH RETROGRADE PYELOGRAM, URETEROSCOPY AND STENT PLACEMENT Left 06/16/2017   Procedure: CYSTOSCOPY WITH RETROGRADE PYELOGRAM, URETEROSCOPY,STONE EXTRACTION  AND STENT PLACEMENT;  Surgeon: Franchot Gallo, MD;  Location: [facility];  Service: Urology;  Laterality: Left;  . ESOPHAGOGASTRODUODENOSCOPY  06/19/2004   Two esophageal rings and esophageal web as described above.  All  of these were disrupted by passing 56-French Venia Minks dilator/ Candida esophagitis,which appears to be incidental given history of   antibiotic use, but nevertheless will be treated.  . ESOPHAGOGASTRODUODENOSCOPY  10/30/2006   Distal tandem esophageal ring status post dilation disruption as  described above.  Otherwise normal esophagus/  Small hiatal hernia otherwise normal stomach, D1 and D2  . ESOPHAGOGASTRODUODENOSCOPY N/A 03/22/2015   Dr.Rourk- noncritical schatzki's ring and hiatal hernia-o/w normal EGD.   Marland Kitchen ESOPHAGOGASTRODUODENOSCOPY (EGD) WITH ESOPHAGEAL DILATION  03/04/2012   RMR- schatzki's ring, hiatal hernia, polypoid gastric mucosa, bx= minimally  active gastritis.  Marland Kitchen HOLMIUM LASER APPLICATION Left 0/62/3762   Procedure: HOLMIUM LASER APPLICATION;  Surgeon: Franchot Gallo, MD;  Location: [facility];  Service: Urology;  Laterality: Left;  . IR GENERIC HISTORICAL  03/06/2016   IR RADIOLOGIST EVAL & MGMT 03/06/2016 Aletta Edouard, MD [facility] INTERV RAD  . IR GENERIC HISTORICAL  06/18/2016   IR RADIOLOGIST EVAL & MGMT 06/18/2016 Aletta Edouard, MD [facility] INTERV RAD  . IR RADIOLOGIST EVAL & MGMT  10/01/2016  . IR RADIOLOGIST EVAL & MGMT  10/15/2017  . LEFT HEART CATH N/A 02/02/2012   Procedure: LEFT HEART CATH;  Surgeon: Lorretta Harp, MD;  [facility] C[facility] LAB;  Service: Cardiovascular;  Laterality: N/A;  . MEDIASTINOSCOPY     for dx sarcoid  . RADIOLOGY WITH ANESTHESIA Left 05/17/2016   Procedure: left renal ablation;  Surgeon: Aletta Edouard, MD;  Location: [facility];  Service: Radiology;  Laterality: Left;    Current Medications: Outpatient Medications Prior to Visit  Medication Sig Dispense Refill  . acetaZOLAMIDE (DIAMOX) 250 MG tablet Take 250 mg by mouth daily.     Marland Kitchen amLODipine (NORVASC) 5 MG tablet TAKE 1 TABLET BY MOUTH  DAILY 90 tablet 1  . aspirin 81 MG tablet Take 81 mg by mouth 3 (three) times a week.     Marland Kitchen atorvastatin (LIPITOR) 40 MG tablet Take one (1) tablet (40 mg) by mouth every evening. 90 tablet 3  . clopidogrel (PLAVIX) 75 MG tablet Take 1 tablet (75 mg total) by mouth daily. 90 tablet 3  . dorzolamide (TRUSOPT) 2 % ophthalmic solution Place 1 drop into both eyes 2 (two) times daily.     Marland Kitchen guaiFENesin (MUCINEX) 600 MG 12 hr tablet Take 1,200 mg by mouth daily.     . isosorbide mononitrate (IMDUR) 60 MG 24 hr tablet TAKE 1 TABLET BY MOUTH IN  THE MORNING AND 1/2 TABLET  AT NIGHT 135 tablet 3  . latanoprost (XALATAN) 0.005 % ophthalmic solution Place 1 drop into both eyes at bedtime.    Marland Kitchen levothyroxine (SYNTHROID, LEVOTHROID) 100 MCG tablet TAKE 1 TABLET BY MOUTH  DAILY 90 tablet 3  . losartan (COZAAR) 100 MG tablet TAKE  1 TABLET BY MOUTH  DAILY (Patient taking differently: TAKE 100 MG BY MOUTH  DAILY) 90 tablet 3  . metoprolol tartrate (LOPRESSOR) 25 MG tablet TAKE ONE-HALF TABLET BY  MOUTH TWICE A DAY 90 tablet 3  . mometasone (ELOCON) 0.1 % ointment Apply 1 application topically daily.     . Multiple Vitamin (MULTIVITAMIN) tablet Take 1 tablet by mouth every evening.     . Multiple Vitamins-Minerals (PRESERVISION/LUTEIN) CAPS Take 1 capsule by mouth 2 (two) times daily.     . nitroGLYCERIN (NITROSTAT) 0.4 MG SL tablet Place 1 tablet (0.4 mg total) under the tongue every 5 (five) minutes as needed. For chest pain (Patient taking differently: Place 0.4 mg under the tongue every 5 (  five) minutes as needed for chest pain. ) 25 tablet 3  . pantoprazole (PROTONIX) 40 MG tablet Take 1 tablet (40 mg total) by mouth 2 (two) times daily before a meal. 180 tablet 2  . timolol (TIMOPTIC) 0.5 % ophthalmic solution Place 1 drop into both eyes daily.     Marland Kitchen triamcinolone (KENALOG) 0.1 % cream Apply 1 application topically 2 (two) times daily as needed (for irritation).     . vitamin C (ASCORBIC ACID) 500 MG tablet Take 500 mg by mouth daily.     . vitamin E 200 UNIT capsule Take 200 Units by mouth daily at 12 noon.     . furosemide (LASIX) 20 MG tablet Take 1 tablet (20 mg total) by mouth daily. 30 tablet 1  . potassium chloride (K-DUR,KLOR-CON) 10 MEQ tablet TAKE 1 TABLET BY MOUTH  DAILY 90 tablet 1  . finasteride (PROSCAR) 5 MG tablet Take 5 mg by mouth daily at 12 noon.      No facility-administered medications prior to visit.      Allergies:   Azithromycin; Doxazosin; Acetaminophen; Atenolol; Hydrocodone; Hydrocodone-acetaminophen; Levofloxacin; Morphine; Penicillins; and Sulfonamide derivatives   Social History   Socioeconomic History  . Marital status: Married    Spouse name: Not on file  . Number of children: 1  . Years of education: Not on file  . Highest education level: Not on file  Occupational History  .  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 Financial resource strain: Not on file  . Food insecurity:    Worry: Not on file    Inability: Not on file  . Transportation needs:    Medical: Not on file    Non-medical: Not on file  Tobacco Use  . Smoking status: Former Smoker    Packs/day: 0.10    Years: 2.00    Pack years: 0.20    Types: Cigarettes, Cigars    Last attempt to quit: 05/06/1970    Years since quitting: 47.8  . Smokeless tobacco: Never Used  Substance and Sexual Activity  . Alcohol use: No    Alcohol/week: 0.0 standard drinks  . Drug use: No  . Sexual activity: Never  Lifestyle  . Physical activity:    Days per week: Not on file    Minutes per session: Not on file  . Stress: Not on file  Relationships  . Social connections:    Talks on phone: Not on file    Gets together: Not on file    Attends religious service: Not on file    Active member of club or organization: Not on file    Attends meetings of clubs or organizations: Not on file    Relationship status: Not on file  Other Topics Concern  . Not on file  Social History Narrative  . Not on file     Family History:  The patient's family history includes Alzheimer's disease in his mother; Cancer in his other; Heart attack in his brother; Heart disease in his father; Stroke in his mother.   ROS:   Please see the history of present illness.    ROS All other systems reviewed and are negative.   PHYSICAL EXAM:   VS:  BP (!) 144/68 (BP Location: Left Arm, Patient Position: Sitting, Cuff Size: Normal)   Pulse 62   Ht 5\' 8"  (1.727 m)   Wt 131 lb (59.4 kg)   BMI 19.92  kg/m    GEN: Well nourished, well developed, in no acute distress  HEENT: normal  Neck: no JVD, carotid bruits, or masses Cardiac: RRR; no murmurs, rubs, or gallops. Bilateral ankle edema  Respiratory:  clear to auscultation bilaterally, normal work of breathing GI: soft, nontender,  nondistended, + BS MS: no deformity or atrophy  Skin: warm and dry, no rash Neuro:  Alert and Oriented x 3, Strength and sensation are intact Psych: euthymic mood, full affect  Wt Readings from Last 3 Encounters:  02/19/18 131 lb (59.4 kg)  02/03/18 130 lb 12.8 oz (59.3 kg)  10/15/17 125 lb (56.7 kg)      Studies/Labs Reviewed:   EKG:  EKG is not ordered today.    Recent Labs: 05/27/2017: Hemoglobin 11.0; Platelets 198 02/10/2018: BUN 21; Creatinine, Ser 0.79; Potassium 4.1; Sodium 141   Lipid Panel    Component Value Date/Time   CHOL 98 01/02/2017 0612   CHOL 127 09/08/2015 0933   TRIG 48 01/02/2017 0612   HDL 37 (L) 01/02/2017 0612   HDL 48 09/08/2015 0933   CHOLHDL 2.6 01/02/2017 0612   VLDL 10 01/02/2017 0612   LDLCALC 51 01/02/2017 0612   LDLCALC 61 09/08/2015 0933    Additional studies/ records that were reviewed today include:   Echo 12/23/2016 LV EF: 55% -   60% Study Conclusions  - Left ventricle: The cavity size was normal. Systolic function was   normal. The estimated ejection fraction was in the range of 55%   to 60%. Wall motion was normal; there were no regional wall   motion abnormalities. Left ventricular diastolic function   parameters were normal.    Myoview 01/03/2017 IMPRESSION: 1. No reversible ischemia or infarction.  2. Normal left ventricular wall motion.  3. Left ventricular ejection fraction 82%  4. Non invasive risk stratification*: Low   ASSESSMENT:    1. Ankle edema   2. Coronary artery disease involving native coronary artery of native heart without angina pectoris   3. Hyperlipidemia, unspecified hyperlipidemia type   4. Essential hypertension   5. Hypothyroidism, unspecified type      PLAN:  In order of problems listed above:  1. Bilateral ankle edema: Increase Lasix to 40 mg daily and increase potassium to 20 mEq daily.  1 week basic metabolic panel  2. CAD: Normal EF and a normal Myoview in 2018.  Denies any  recent chest pain.  Continue aspirin  3. Hypertension: Blood pressure elevated.  We will continue diuresis  4. Hyperlipidemia: On Lipitor 40 mg daily  5. Hypothyroidism: On Synthroid, managed by primary care provider.    Medication Adjustments/Labs and Tests Ordered: Current medicines are reviewed at length with the patient today.  Concerns regarding medicines are outlined above.  Medication changes, Labs and Tests ordered today are listed in the Patient Instructions below. Patient Instructions  Medication Instructions:  INCREASE Lasix to 40mg  Take 1 tablet once day  INCREASE Potassium 77meq take 1 tablet once a day  If you need a refill on your cardiac medications before your next appointment, please call your pharmacy.   Lab work: Your physician recommends that you return for lab work in: BMET in 1 week  If you have labs (blood work) drawn today and your tests are completely normal, you will receive your results only by: Marland Kitchen MyChart Message (if you have MyChart) OR . A paper copy in the mail If you have any lab test that is abnormal or we need to change your  treatment, we will call you to review the results.  Testing/Procedures: None   Follow-Up: At [facility], you and your health needs are our priority.  As part of our continuing mission to provide you with exceptional heart care, we have created designated Provider Care Teams.  These Care Teams include your primary Cardiologist (physician) and Advanced Practice Providers (APPs -  Physician Assistants and Nurse Practitioners) who all work together to provide you with the care you need, when you need it. You will need a follow up appointment in 2-3 months.  Please call our office 2 months in advance to schedule this appointment.  You may see Shelva Majestic, MD or one of the following Advanced Practice Providers on your designated Care Team: [place], [place] . Fabian Sharp, PA-C  Any Other Special Instructions Will Be Listed Below (If  Applicable).      Hilbert Corrigan, [place]  02/21/2018 11:49 PM    [facility] [place], [place], [place]  [address] Phone: [phone]; Fax: [phone]

## 2018-02-21 ENCOUNTER — Encounter: Payer: Self-pay | Admitting: Physician Assistant

## 2018-03-03 DIAGNOSIS — M25473 Effusion, unspecified ankle: Secondary | ICD-10-CM | POA: Diagnosis not present

## 2018-03-04 DIAGNOSIS — H401121 Primary open-angle glaucoma, left eye, mild stage: Secondary | ICD-10-CM | POA: Diagnosis not present

## 2018-03-04 DIAGNOSIS — Z961 Presence of intraocular lens: Secondary | ICD-10-CM | POA: Diagnosis not present

## 2018-03-04 DIAGNOSIS — H401112 Primary open-angle glaucoma, right eye, moderate stage: Secondary | ICD-10-CM | POA: Diagnosis not present

## 2018-03-04 LAB — BASIC METABOLIC PANEL
BUN / CREAT RATIO: 34 — AB (ref 10–24)
BUN: 23 mg/dL (ref 8–27)
CO2: 21 mmol/L (ref 20–29)
CREATININE: 0.68 mg/dL — AB (ref 0.76–1.27)
Calcium: 9.4 mg/dL (ref 8.6–10.2)
Chloride: 105 mmol/L (ref 96–106)
GFR calc Af Amer: 99 mL/min/{1.73_m2} (ref >59)
GFR calc non Af Amer: 86 mL/min/{1.73_m2} (ref >59)
GLUCOSE: 107 mg/dL — AB (ref 65–99)
Potassium: 4.1 mmol/L (ref 3.5–5.2)
SODIUM: 140 mmol/L (ref 134–144)

## 2018-03-13 ENCOUNTER — Ambulatory Visit (HOSPITAL_COMMUNITY)
Admission: RE | Admit: 2018-03-13 | Discharge: 2018-03-13 | Disposition: A | Discharge status: Home / Self Care | Payer: Medicare Other | Source: Ambulatory Visit | Attending: Urology | Admitting: Urology

## 2018-03-13 ENCOUNTER — Encounter (HOSPITAL_COMMUNITY): Payer: Self-pay

## 2018-03-13 DIAGNOSIS — C641 Malignant neoplasm of right kidney, except renal pelvis: Secondary | ICD-10-CM

## 2018-03-17 ENCOUNTER — Other Ambulatory Visit: Payer: Self-pay | Admitting: Urology

## 2018-03-17 DIAGNOSIS — N62 Hypertrophy of breast: Secondary | ICD-10-CM

## 2018-03-18 ENCOUNTER — Other Ambulatory Visit: Payer: Self-pay

## 2018-03-18 MED ORDER — LOSARTAN POTASSIUM 100 MG PO TABS: 100.0000 mg | ORAL_TABLET | Freq: Every day | ORAL | 1 refills | Status: DC

## 2018-03-18 MED ORDER — ATORVASTATIN CALCIUM 40 MG PO TABS: ORAL_TABLET | ORAL | 1 refills | Status: DC

## 2018-03-18 NOTE — Telephone Encounter (Signed)
Patient needs lipid panel on next OV with [name]

## 2018-03-30 ENCOUNTER — Other Ambulatory Visit: Payer: Self-pay | Admitting: Cardiovascular Disease

## 2018-03-31 ENCOUNTER — Ambulatory Visit (HOSPITAL_COMMUNITY)
Admission: RE | Admit: 2018-03-31 | Discharge: 2018-03-31 | Disposition: A | Discharge status: Home / Self Care | Payer: Medicare Other | Source: Ambulatory Visit | Attending: Urology | Admitting: Urology

## 2018-03-31 ENCOUNTER — Ambulatory Visit (HOSPITAL_COMMUNITY): Payer: Medicare Other

## 2018-03-31 ENCOUNTER — Ambulatory Visit (HOSPITAL_COMMUNITY): Admission: RE | Admit: 2018-03-31 | Payer: Medicare Other | Source: Ambulatory Visit

## 2018-03-31 DIAGNOSIS — N62 Hypertrophy of breast: Secondary | ICD-10-CM

## 2018-03-31 DIAGNOSIS — R928 Other abnormal and inconclusive findings on diagnostic imaging of breast: Secondary | ICD-10-CM | POA: Diagnosis not present

## 2018-04-06 ENCOUNTER — Other Ambulatory Visit: Payer: Self-pay | Admitting: Cardiovascular Disease

## 2018-04-06 MED ORDER — NITROGLYCERIN 0.4 MG SL SUBL: 0.4000 mg | SUBLINGUAL_TABLET | SUBLINGUAL | 3 refills | Status: DC | PRN

## 2018-04-06 NOTE — Telephone Encounter (Signed)
°*  STAT* If patient is at the pharmacy, call can be transferred to refill team.   1. Which medications need to be refilled? (please list name of each medication and dose if known) New Prescription for Nitroglycerin  2. Which pharmacy/location (including street and city if local pharmacy) is medication to be sent to?[facility]- [phone] 3. Do they need a 30 day or 90 day supply? 1 bottle

## 2018-04-30 DIAGNOSIS — Z23 Encounter for immunization: Secondary | ICD-10-CM | POA: Diagnosis not present

## 2018-05-31 ENCOUNTER — Other Ambulatory Visit: Payer: Self-pay | Admitting: Cardiovascular Disease

## 2018-06-01 DIAGNOSIS — D225 Melanocytic nevi of trunk: Secondary | ICD-10-CM | POA: Diagnosis not present

## 2018-06-01 DIAGNOSIS — X32XXXD Exposure to sunlight, subsequent encounter: Secondary | ICD-10-CM | POA: Diagnosis not present

## 2018-06-01 DIAGNOSIS — L820 Inflamed seborrheic keratosis: Secondary | ICD-10-CM | POA: Diagnosis not present

## 2018-06-01 DIAGNOSIS — L570 Actinic keratosis: Secondary | ICD-10-CM | POA: Diagnosis not present

## 2018-06-02 ENCOUNTER — Ambulatory Visit (INDEPENDENT_AMBULATORY_CARE_PROVIDER_SITE_OTHER): Payer: Medicare Other | Admitting: Cardiovascular Disease

## 2018-06-02 ENCOUNTER — Encounter: Payer: Self-pay | Admitting: Cardiovascular Disease

## 2018-06-02 VITALS — BP 136/66 | HR 65 | Ht 68.0 in | Wt 131.0 lb

## 2018-06-02 DIAGNOSIS — I10 Essential (primary) hypertension: Secondary | ICD-10-CM

## 2018-06-02 DIAGNOSIS — M25473 Effusion, unspecified ankle: Secondary | ICD-10-CM | POA: Diagnosis not present

## 2018-06-02 DIAGNOSIS — I2510 Atherosclerotic heart disease of native coronary artery without angina pectoris: Secondary | ICD-10-CM

## 2018-06-02 DIAGNOSIS — K219 Gastro-esophageal reflux disease without esophagitis: Secondary | ICD-10-CM | POA: Diagnosis not present

## 2018-06-02 DIAGNOSIS — Z85528 Personal history of other malignant neoplasm of kidney: Secondary | ICD-10-CM | POA: Diagnosis not present

## 2018-06-02 DIAGNOSIS — E785 Hyperlipidemia, unspecified: Secondary | ICD-10-CM | POA: Diagnosis not present

## 2018-06-02 DIAGNOSIS — Z87442 Personal history of urinary calculi: Secondary | ICD-10-CM | POA: Diagnosis not present

## 2018-06-02 MED ORDER — CLOPIDOGREL BISULFATE 75 MG PO TABS: 75.0000 mg | ORAL_TABLET | Freq: Every day | ORAL | 0 refills | Status: DC

## 2018-06-02 MED ORDER — FUROSEMIDE 40 MG PO TABS: ORAL_TABLET | ORAL | 3 refills | Status: DC

## 2018-06-02 MED ORDER — POTASSIUM CHLORIDE CRYS ER 20 MEQ PO TBCR: 20.0000 meq | EXTENDED_RELEASE_TABLET | Freq: Every day | ORAL | 2 refills | Status: DC

## 2018-06-02 MED ORDER — ATORVASTATIN CALCIUM 40 MG PO TABS: ORAL_TABLET | ORAL | 1 refills | Status: DC

## 2018-06-02 NOTE — Patient Instructions (Signed)
Medication Instructions:  Refill sent to pharmacy. If you need a refill on your cardiac medications before your next appointment, please call your pharmacy.   Lab work: Fasting lab work in [place] If you have labs (blood work) drawn today and your tests are completely normal, you will receive your results only by: Marland Kitchen MyChart Message (if you have MyChart) OR . A paper copy in the mail If you have any lab test that is abnormal or we need to change your treatment, we will call you to review the results.   Follow-Up: At [facility], you and your health needs are our priority.  As part of our continuing mission to provide you with exceptional heart care, we have created designated Provider Care Teams.  These Care Teams include your primary Cardiologist (physician) and Advanced Practice Providers (APPs -  Physician Assistants and Nurse Practitioners) who all work together to provide you with the care you need, when you need it. . Follow up Dale Deforest, PA-C in 6 months . Follow up Dr.Kelly 12 months.

## 2018-06-02 NOTE — Progress Notes (Signed)
Patient ID: Dale [name], male   DOB: December 23, 1928, 85 y.o.   MRN: [id number]    Primary M.D.: Dr. Redmond School  HPI: Dale [name] is a 85 y.o. male who presents to the office for a 9 month cardiology evaluation.  [name] has known CAD and underwent initial intervention to his RCA in 2000. In 2009 a Cypher stent was placed beyond the acute margin of his RCA. His last cardiac catheterization was in September 2013 by Dr. [name] which showed 30% LAD narrowing after the second diagonal vessel, and normal left circumflex coronary artery, and patent RCA stents.  He has a history of hypothyroidism on Synthroid replacement and has a history of hyperlipidemia. He had been on on Crestor 5 mg plus Zetia 10 mg; however, due to recent increased cost of Zetia, this was discontinued and he has been on Crestor 20 mg daily.  He also has a history of GERD treated with Protonix, and he continues to be on dual antiplatelet therapy. He is responsive to Plavix on previous P2 Y12 testing.  When I saw him in 2016 he had experienced 3-4 episodes of chest pain over the six-month period.  Most of these episodes have occurred at night and were nitrate responsive.  He states that they were similar to his previous discomfort.  When I saw him, I further titrated his isosorbide mononitrate to 60 mg in the morning and 30 mg at night.    He saw [name], [place] in August 2018 with weight loss, weakness, and vague chest pain.  He felt his chest pain was atypical and not ischemic.  An echo Doppler study on 12/23/2016 showed an EF of 55-60%.  He was hospitalized on August 29 through January 03, 2017 at  [facility].  Apparently he had a nuclear stress test which did not reveal any reversible ischemia.  He was also felt to have acute sinusitis for which he was treated with antibiotics.  He has a history of renal cell CA.Marland Kitchen     When I last saw him in October 2018, his chest pain had resolved. He underwent a carotid duplex study in October  2018 which showed mild plaque bilaterally at 1-39%. He denies increasing episodes of chest pain.  At times he notes rare chest discomfort which is short-lived.  He had developed some gross hematuria and was found to have a kidney stone which he underwent cystoscopy, left ureteral pyelogram with fluoroscopic interpretation, left ureteroscopy, holmium laser lithotripsy and extraction of left mid ureteral stone, placement of 24 x 6 French contour double-J stent, without tether by Dr [name] on 06/16/2017.  I last saw him in March 2019.  Since that time, he has been seen by [name], PAC on 2 occasions in October.  He was seen initially on February 03, 2018 for bilateral ankle edema at which time his hydrochlorothiazide was switched to Lasix.  When seen in follow-up he continued to have ankle edema and Lasix was further titrated to 40 mg and potassium supplementation was increased.  Over the last several months, he has done well.  Several weeks ago he experienced a sharp chest ache which was nonexertional.  He notes increased thirst since being on the increased Lasix dose.  He denies recurrent anginal type symptoms.  He is sleepy during the day but admits that he is sleeping well at night.  He typically goes to bed at 11 PM and wakes up at 7 AM.  He presents for reevaluation.  Past Medical History:  Diagnosis Date  . Allergic rhinitis   . Anal fissure   . Anginal pain ([place])   . Asthma   . Cancer ([place])    skin  . Chest pain   . CHF (congestive heart failure) ([place])   . Coronary heart disease    s/p stenting. cath in 01/2012 noncritical occlusion  . Dysrhythmia    1st degree heart block  . GERD (gastroesophageal reflux disease)   . Glaucoma   . Hiatal hernia   . Hyperlipidemia   . Hypertension   . Hypothyroidism   . Idiopathic thrombocytopenic purpura ([place]) 2002  . Macular degeneration   . Nephrolithiasis   . PUD (peptic ulcer disease)    remote  . Sarcoidosis    pulmonary  . Schatzki's ring      Past Surgical History:  Procedure Laterality Date  . cardiac stents    . COLONOSCOPY  10/30/2006   Normal rectum, sigmoid diverticula.Remainder of colonic mucosa appeared normal.  . CORONARY ANGIOPLASTY WITH STENT PLACEMENT     about 10 years ago per pt (around 2007)  . CYSTOSCOPY WITH RETROGRADE PYELOGRAM, URETEROSCOPY AND STENT PLACEMENT Left 06/16/2017   Procedure: CYSTOSCOPY WITH RETROGRADE PYELOGRAM, URETEROSCOPY,STONE EXTRACTION  AND STENT PLACEMENT;  Surgeon: Franchot Gallo, MD;  Location: [facility];  Service: Urology;  Laterality: Left;  . ESOPHAGOGASTRODUODENOSCOPY  06/19/2004   Two esophageal rings and esophageal web as described above.  All of these were disrupted by passing 56-French Venia Minks dilator/ Candida esophagitis,which appears to be incidental given history of   antibiotic use, but nevertheless will be treated.  . ESOPHAGOGASTRODUODENOSCOPY  10/30/2006   Distal tandem esophageal ring status post dilation disruption as  described above.  Otherwise normal esophagus/  Small hiatal hernia otherwise normal stomach, D1 and D2  . ESOPHAGOGASTRODUODENOSCOPY N/A 03/22/2015   Dr.Rourk- noncritical schatzki's ring and hiatal hernia-o/w normal EGD.   Marland Kitchen ESOPHAGOGASTRODUODENOSCOPY (EGD) WITH ESOPHAGEAL DILATION  03/04/2012   RMR- schatzki's ring, hiatal hernia, polypoid gastric mucosa, bx= minimally active gastritis.  Marland Kitchen HOLMIUM LASER APPLICATION Left 0/16/5537   Procedure: HOLMIUM LASER APPLICATION;  Surgeon: Franchot Gallo, MD;  Location: [facility];  Service: Urology;  Laterality: Left;  . IR GENERIC HISTORICAL  03/06/2016   IR RADIOLOGIST EVAL & MGMT 03/06/2016 Aletta Edouard, MD [facility] INTERV RAD  . IR GENERIC HISTORICAL  06/18/2016   IR RADIOLOGIST EVAL & MGMT 06/18/2016 Aletta Edouard, MD [facility] INTERV RAD  . IR RADIOLOGIST EVAL & MGMT  10/01/2016  . IR RADIOLOGIST EVAL & MGMT  10/15/2017  . LEFT HEART CATH N/A 02/02/2012   Procedure: LEFT HEART CATH;  Surgeon: Lorretta Harp,  MD;  Location: [facility] CATH LAB;  Service: Cardiovascular;  Laterality: N/A;  . MEDIASTINOSCOPY     for dx sarcoid  . RADIOLOGY WITH ANESTHESIA Left 05/17/2016   Procedure: left renal ablation;  Surgeon: Aletta Edouard, MD;  Location: [facility];  Service: Radiology;  Laterality: Left;    Allergies  Allergen Reactions  . Azithromycin Other (See Comments)    Sore mouth and fever blisters around mouth, sores in nose area as well  . Doxazosin Shortness Of Breath  . Acetaminophen Other (See Comments)    REACTION: UNKNOWN REACTION  . Atenolol Other (See Comments)    REACTION: UNKNOWN REACTION  . Hydrocodone Nausea And Vomiting  . Hydrocodone-Acetaminophen Nausea Only  . Levofloxacin Other (See Comments)    Caused stomach problems.  . Morphine Other (See Comments)    "made me crazy"  .  Penicillins Nausea And Vomiting and Other (See Comments)    Has patient had a PCN reaction causing immediate rash, facial/tongue/throat swelling, SOB or lightheadedness with hypotension: No Has patient had a PCN reaction causing severe rash involving mucus membranes or skin necrosis: No Has patient had a PCN reaction that required hospitalization No Has patient had a PCN reaction occurring within the last 10 years: No If all of the above answers are "NO", then may proceed with Cephalosporin use.   . Sulfonamide Derivatives Nausea And Vomiting    Current Outpatient Medications  Medication Sig Dispense Refill  . acetaZOLAMIDE (DIAMOX) 250 MG tablet Take 250 mg by mouth daily.     Marland Kitchen amLODipine (NORVASC) 5 MG tablet TAKE 1 TABLET BY MOUTH  DAILY 90 tablet 0  . aspirin 81 MG tablet Take 81 mg by mouth 3 (three) times a week.     Marland Kitchen atorvastatin (LIPITOR) 40 MG tablet Take one (1) tablet (40 mg) by mouth every evening. 90 tablet 1  . clopidogrel (PLAVIX) 75 MG tablet Take 1 tablet (75 mg total) by mouth daily. 90 tablet 0  . dorzolamide (TRUSOPT) 2 % ophthalmic solution Place 1 drop into both eyes 2 (two) times daily.      . furosemide (LASIX) 40 MG tablet TAKE 1 TABLET(40 MG) BY MOUTH DAILY 90 tablet 3  . guaiFENesin (MUCINEX) 600 MG 12 hr tablet Take 1,200 mg by mouth daily.     . isosorbide mononitrate (IMDUR) 60 MG 24 hr tablet TAKE 1 TABLET BY MOUTH IN  THE MORNING AND 1/2 TABLET  AT NIGHT 135 tablet 3  . latanoprost (XALATAN) 0.005 % ophthalmic solution Place 1 drop into both eyes at bedtime.    Marland Kitchen levothyroxine (SYNTHROID, LEVOTHROID) 100 MCG tablet TAKE 1 TABLET BY MOUTH  DAILY 90 tablet 3  . losartan (COZAAR) 100 MG tablet Take 1 tablet (100 mg total) by mouth daily. 90 tablet 1  . metoprolol tartrate (LOPRESSOR) 25 MG tablet TAKE ONE-HALF TABLET BY  MOUTH TWICE A DAY 90 tablet 3  . mometasone (ELOCON) 0.1 % ointment Apply 1 application topically daily.     . Multiple Vitamin (MULTIVITAMIN) tablet Take 1 tablet by mouth every evening.     . Multiple Vitamins-Minerals (PRESERVISION/LUTEIN) CAPS Take 1 capsule by mouth 2 (two) times daily.     . nitroGLYCERIN (NITROSTAT) 0.4 MG SL tablet Place 1 tablet (0.4 mg total) under the tongue every 5 (five) minutes as needed. For chest pain 25 tablet 3  . pantoprazole (PROTONIX) 40 MG tablet Take 1 tablet (40 mg total) by mouth 2 (two) times daily before a meal. 180 tablet 2  . potassium chloride SA (K-DUR,KLOR-CON) 20 MEQ tablet Take 1 tablet (20 mEq total) by mouth daily. 30 tablet 2  . timolol (TIMOPTIC) 0.5 % ophthalmic solution Place 1 drop into both eyes daily.     Marland Kitchen triamcinolone (KENALOG) 0.1 % cream Apply 1 application topically 2 (two) times daily as needed (for irritation).     . vitamin C (ASCORBIC ACID) 500 MG tablet Take 500 mg by mouth daily.     . vitamin E 200 UNIT capsule Take 200 Units by mouth daily at 12 noon.      No current facility-administered medications for this visit.     Social History   Socioeconomic History  . Marital status: Married    Spouse name: Not on file  . Number of children: 1  . Years of education: Not on file  .  Highest education level: Not on file  Occupational History  . 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 Financial resource strain: Not on file  . Food insecurity:    Worry: Not on file    Inability: Not on file  . Transportation needs:    Medical: Not on file    Non-medical: Not on file  Tobacco Use  . Smoking status: Former Smoker    Packs/day: 0.10    Years: 2.00    Pack years: 0.20    Types: Cigarettes, Cigars    Last attempt to quit: 05/06/1970    Years since quitting: 48.1  . Smokeless tobacco: Never Used  Substance and Sexual Activity  . Alcohol use: No    Alcohol/week: 0.0 standard drinks  . Drug use: No  . Sexual activity: Never  Lifestyle  . Physical activity:    Days per week: Not on file    Minutes per session: Not on file  . Stress: Not on file  Relationships  . Social connections:    Talks on phone: Not on file    Gets together: Not on file    Attends religious service: Not on file    Active member of club or organization: Not on file    Attends meetings of clubs or organizations: Not on file    Relationship status: Not on file  . Intimate partner violence:    Fear of current or ex partner: Not on file    Emotionally abused: Not on file    Physically abused: Not on file    Forced sexual activity: Not on file  Other Topics Concern  . Not on file  Social History Narrative  . Not on file   Additional social history is notable that he is married and lives with his wife. He quit smoking over 40 years ago. Has one child. He remains active.  Family History  Problem Relation Age of Onset  . Heart disease Father        deceased age 56  . Stroke Mother   . Alzheimer's disease Mother   . Heart attack Brother        deceased age 18  . Cancer Other        niece  . Colon cancer Neg Hx    ROS General: Negative; No fevers, chills, or night sweats; Positive for weight loss HEENT: Positive for recent  sinusitis.  No changes in vision., difficulty swallowing Pulmonary: Negative; No cough, wheezing, shortness of breath, hemoptysis Cardiovascular: See history of present illness GI: Negative; No nausea, vomiting, diarrhea, or abdominal pain GU: History of renal cell CA status post cryoablation; status post renal stone removal Musculoskeletal: Negative; no myalgias, joint pain, or weakness Hematologic/Oncology: Negative; no easy bruising, bleeding Endocrine: Positive for hypothyroidism; no diabetes Neuro: Negative; no changes in balance, headaches Skin: Negative; No rashes or skin lesions Psychiatric: Negative; No behavioral problems, depression Sleep: Negative; No snoring, daytime sleepiness, hypersomnolence, bruxism, restless legs, hypnogognic hallucinations, no cataplexy Other comprehensive 14 point system review is negative.   PE BP 136/66   Pulse 65   Ht 5' 8" (1.727 m)   Wt 131 lb (59.4 kg)   SpO2 99%   BMI 19.92 kg/m    Repeat blood pressure by me 126/76  Wt Readings from Last 3 Encounters:  06/02/18 131 lb (59.4 kg)  02/19/18 131 lb (59.4 kg)  02/03/18 130 lb 12.8 oz (59.3 kg)  General: Alert, oriented, no distress.  Skin: normal turgor, no rashes, warm and dry HEENT: Normocephalic, atraumatic. Pupils equal round and reactive to light; sclera anicteric; extraocular muscles intact; Nose without nasal septal hypertrophy Mouth/Parynx benign; Mallinpatti scale 2 Neck: No JVD, no carotid bruits; normal carotid upstroke Lungs: clear to ausculatation and percussion; no wheezing or rales Chest wall: without tenderness to palpitation Heart: PMI not displaced, RRR, s1 s2 normal, 1/6 systolic murmur, no diastolic murmur, no rubs, gallops, thrills, or heaves Abdomen: soft, nontender; no hepatosplenomehaly, BS+; abdominal aorta nontender and not dilated by palpation. Back: no CVA tenderness Pulses 2+ Musculoskeletal: full range of motion, normal strength, no joint  deformities Extremities: Trivial focal ankle edema, no pretibial or feet edema; no clubbing cyanosis, Homan's sign negative  Neurologic: grossly nonfocal; Cranial nerves grossly wnl Psychologic: Normal mood and affect   ECG (independently read by me): Sinus rhythm at 62 bpm with first-degree block with apparent of what to 40 ms.  No significant ST-T changes.  July 24, 2017 ECG (independently read by me): normal sinus rhythm at 60 bpm with first-degree AV block.  PR interval 236 ms.  QTc interval normal.  No ectopy.  October 2018 ECG (independently read by me): Sinus bradycardia 50 bpm.  First degree AV block with a PR interval at 224 ms.  No syncope ST changes.  September 2017 ECG (independently read by me): Sinus bradycardia 59 bpm.  First-degree AV block with PR interval 240 ms.  No significant ST-T changes.  November 2016 ECG (independently read by me): Sinus bradycardia at 55 beats per minute with first-degree AV block.  PR interval 246 ms.  No significant ST-T changes.  May 2016 ECG (independently read by me): Sinus rhythm with first-degree heart block.  PR interval 226.  No ST segment changes.  February 2016 ECG (independently read by me): Sinus rhythm at 65 bpm.  Mild first-degree AV block with a PR interval 228 ms.  No significant ST segment changes.  December 2015 ECG (independently read by me): Sinus bradycardia 57 bpm.  Borderline first-degree AV block with a PR interval 216 daily.  Seconds.  QTc interval is normal at 373 ms.  June 2015 ECG (independently read by me): Sinus bradycardia 54 beats per minute.  First degree block with a PR interval of 232 ms.  No significant ST-T changes.  Prior ECG: Sinus bradycardia 54 beats per minute. PR interval 206 ms. QTc interval normal  LABS: BMP Latest Ref Rng & Units 03/03/2018 02/10/2018 10/14/2017  Glucose 65 - 99 mg/dL 107(H) 105(H) -  BUN 8 - 27 mg/dL 23 21 -  Creatinine 0.76 - 1.27 mg/dL 0.68(L) 0.79 1.00  BUN/Creat Ratio 10 - 24  34(H) 27(H) -  Sodium 134 - 144 mmol/L 140 141 -  Potassium 3.5 - 5.2 mmol/L 4.1 4.1 -  Chloride 96 - 106 mmol/L 105 107(H) -  CO2 20 - 29 mmol/L 21 20 -  Calcium 8.6 - 10.2 mg/dL 9.4 8.8 -   Hepatic Function Latest Ref Rng & Units 09/08/2015 07/07/2014 05/11/2013  Total Protein 6.0 - 8.5 g/dL 6.3 6.5 6.7  Albumin 3.5 - 4.7 g/dL 3.9 4.2 3.6  AST 0 - 40 IU/L _0 ALT 0 - 44 IU/L _1 Alk Phosphatase 39 - 117 IU/L 76 73 74  Total Bilirubin 0.0 - 1.2 mg/dL 0.7 0.4 0.4  Bilirubin, Direct - - - -   CBC Latest Ref Rng & Units 05/27/2017 04/07/2017 01/02/2017  WBC  3.8 - 10.8 Thousand/uL 5.3 5.5 6.2  Hemoglobin 13.2 - 17.1 g/dL 11.0(L) 11.4(L) 10.8(L)  Hematocrit 38.5 - 50.0 % 32.4(L) 35.2(L) 33.3(L)  Platelets 140 - 400 Thousand/uL 198 195 182   Lab Results  Component Value Date   MCV 101.9 (H) 05/27/2017   MCV 106.7 (H) 04/07/2017   MCV 102.8 (H) 01/02/2017   Lab Results  Component Value Date   TSH 1.304 01/02/2017  No results found for: HGBA1C   Lipid Panel     Component Value Date/Time   CHOL 98 01/02/2017 0612   CHOL 127 09/08/2015 0933   TRIG 48 01/02/2017 0612   HDL 37 (L) 01/02/2017 0612   HDL 48 09/08/2015 0933   CHOLHDL 2.6 01/02/2017 0612   VLDL 10 01/02/2017 0612   LDLCALC 51 01/02/2017 0612   LDLCALC 61 09/08/2015 0933   RADIOLOGY: No results found.  IMPRESSION:  1. Essential hypertension   2. Hyperlipidemia with target LDL less than 70   3. Coronary artery disease involving native coronary artery of native heart without angina pectoris   4. Ankle edema   5. Gastroesophageal reflux disease without esophagitis   6. History of kidney stones   7. History of renal carcinoma     ASSESSMENT AND PLAN: Mr. Dyrell Tuccillo is an 85 years old gentleman who has known CAD and underwent initial intervention to his RCA in 2000 and subsequent intervention in 2009. At last catheterization in 2013 his RCA stents were patent and he had mild 30% LAD narrowing.  He has had  some issues recently with leg edema which has necessitated changing his diuretic regimen to furosemide in place of hydrochlorothiazide.  His blood pressure today is stable on his regimen consisting of amlodipine 5 mg, furosemide 40 mg, isosorbide 60 mg in the morning and 30 mg at night in addition to his losartan 100 mg daily and metoprolol tartrate 12.5 mg twice a day.  Times he experiences an atypical sharp chest pain which is not his ischemic chest discomfort.  He has a history of hypothyroidism and continues to take levothyroxine 100 mcg.  His last echo Doppler study in August 2018 showed normal EF at 55 to 60% without regional wall motion abnormalities and he had normal diastolic parameters.  He continues to be on aspirin and Plavix and is tolerating this well without bleeding.  He has not had any further kidney stones.  He has GERD controlled with pantoprazole.  I am recommending fasting laboratory be obtained on current therapy since his last lab work reviewed was from May 2019.  He continues to be on atorvastatin 40 mg and at that time LDL cholesterol was excellent at 52.  I recommended he see [name], PAC in 6 months and I will see him in 1 year for cardiology reevaluation.  Time spent: 25 minutes  Troy Sine, MD, [facility]  06/03/2018 6:11 PM

## 2018-06-03 ENCOUNTER — Encounter: Payer: Self-pay | Admitting: Cardiovascular Disease

## 2018-06-05 ENCOUNTER — Telehealth: Payer: Self-pay | Admitting: *Deleted

## 2018-06-05 DIAGNOSIS — H401121 Primary open-angle glaucoma, left eye, mild stage: Secondary | ICD-10-CM | POA: Diagnosis not present

## 2018-06-05 DIAGNOSIS — Z961 Presence of intraocular lens: Secondary | ICD-10-CM | POA: Diagnosis not present

## 2018-06-05 DIAGNOSIS — H401113 Primary open-angle glaucoma, right eye, severe stage: Secondary | ICD-10-CM | POA: Diagnosis not present

## 2018-06-05 DIAGNOSIS — H04123 Dry eye syndrome of bilateral lacrimal glands: Secondary | ICD-10-CM | POA: Diagnosis not present

## 2018-06-05 NOTE — Telephone Encounter (Signed)
   [facility] Pre-operative Risk Assessment    Request for surgical clearance:  1. What type of surgery is being performed? Goniotomy OD   2. When is this surgery scheduled? 06-18-2018   3. What type of clearance is required (medical clearance vs. Pharmacy clearance to hold med vs. Both)? both  4. Are there any medications that need to be held prior to surgery and how long?aspirin and plavix-? How long is safe?   5. Practice name and name of physician performing surgery? Dr Katy Fitch   6. What is your office phone number [phone]    7.   What is your office fax number [phone]  8.   Anesthesia type (None, local, MAC, general) ? MAC   Dale [name] 06/05/2018, 3:40 PM  _________________________________________________________________   (provider comments below)

## 2018-06-11 NOTE — Telephone Encounter (Signed)
Ok to hold asa/plavix for 5 days prior to procedure

## 2018-06-11 NOTE — Telephone Encounter (Signed)
   Primary Cardiologist: Shelva Majestic, MD  Chart reviewed as part of pre-operative protocol coverage. Patient was contacted 06/11/2018 in reference to pre-operative risk assessment for pending surgery as outlined below.  Dale [name] was last seen on 06/02/18 by [name].  Since that day, Dale [name] has done with his CAD without chest pain or SOB.  He may hold the asprin and plavix up to 5 days if needed for surgery and resume post op.   Therefore, based on ACC/AHA guidelines, the patient would be at acceptable risk for the planned procedure without further cardiovascular testing.   I will route this recommendation to the requesting party via Epic fax function and remove from pre-op pool.  Please call with questions.  Cecilie Kicks, NP 06/11/2018, 9:51 AM

## 2018-06-11 NOTE — Telephone Encounter (Signed)
[name] can pt hold ASA and plavix for 5 days for surgery, you just saw pt and he is for Goniotomy with Dr. Katy Fitch.   Surgery is 06/18/18 thanks.

## 2018-06-12 DIAGNOSIS — E785 Hyperlipidemia, unspecified: Secondary | ICD-10-CM | POA: Diagnosis not present

## 2018-06-12 DIAGNOSIS — E039 Hypothyroidism, unspecified: Secondary | ICD-10-CM | POA: Diagnosis not present

## 2018-06-12 DIAGNOSIS — I2510 Atherosclerotic heart disease of native coronary artery without angina pectoris: Secondary | ICD-10-CM | POA: Diagnosis not present

## 2018-06-12 DIAGNOSIS — I10 Essential (primary) hypertension: Secondary | ICD-10-CM | POA: Diagnosis not present

## 2018-06-15 LAB — COMPREHENSIVE METABOLIC PANEL
A/G RATIO: 1.8 (ref 1.2–2.2)
ALK PHOS: 97 IU/L (ref 39–117)
ALT: 23 IU/L (ref 0–44)
AST: 22 IU/L (ref 0–40)
Albumin: 4.1 g/dL (ref 3.6–4.6)
BUN/Creatinine Ratio: 31 — ABNORMAL HIGH (ref 10–24)
BUN: 28 mg/dL — ABNORMAL HIGH (ref 8–27)
Bilirubin Total: 0.3 mg/dL (ref 0.0–1.2)
CO2: 26 mmol/L (ref 20–29)
Calcium: 9.4 mg/dL (ref 8.6–10.2)
Chloride: 109 mmol/L — ABNORMAL HIGH (ref 96–106)
Creatinine, Ser: 0.91 mg/dL (ref 0.76–1.27)
GFR calc Af Amer: 87 mL/min/{1.73_m2} (ref >59)
GFR calc non Af Amer: 76 mL/min/{1.73_m2} (ref >59)
GLOBULIN, TOTAL: 2.3 g/dL (ref 1.5–4.5)
Glucose: 100 mg/dL — ABNORMAL HIGH (ref 65–99)
POTASSIUM: 4.2 mmol/L (ref 3.5–5.2)
SODIUM: 139 mmol/L (ref 134–144)
Total Protein: 6.4 g/dL (ref 6.0–8.5)

## 2018-06-15 LAB — TSH: TSH: 5.87 u[IU]/mL — ABNORMAL HIGH (ref 0.450–4.500)

## 2018-06-15 LAB — CBC
Hematocrit: 31.3 % — ABNORMAL LOW (ref 37.5–51.0)
Hemoglobin: 10.8 g/dL — ABNORMAL LOW (ref 13.0–17.7)
MCH: 33.9 pg — ABNORMAL HIGH (ref 26.6–33.0)
MCHC: 34.5 g/dL (ref 31.5–35.7)
MCV: 98 fL — ABNORMAL HIGH (ref 79–97)
PLATELETS: 189 10*3/uL (ref 150–450)
RBC: 3.19 x10E6/uL — AB (ref 4.14–5.80)
RDW: 13.6 % (ref 11.6–15.4)
WBC: 4.7 10*3/uL (ref 3.4–10.8)

## 2018-06-15 LAB — LIPID PANEL
CHOLESTEROL TOTAL: 111 mg/dL (ref 100–199)
Chol/HDL Ratio: 2.2 ratio (ref 0.0–5.0)
HDL: 50 mg/dL (ref >39)
LDL Calculated: 48 mg/dL (ref 0–99)
TRIGLYCERIDES: 66 mg/dL (ref 0–149)
VLDL CHOLESTEROL CAL: 13 mg/dL (ref 5–40)

## 2018-06-18 DIAGNOSIS — H401113 Primary open-angle glaucoma, right eye, severe stage: Secondary | ICD-10-CM | POA: Diagnosis not present

## 2018-06-22 ENCOUNTER — Telehealth: Payer: Self-pay | Admitting: Internal Medicine

## 2018-06-22 NOTE — Telephone Encounter (Signed)
Routing to [facility] refill box 

## 2018-06-22 NOTE — Telephone Encounter (Signed)
PATIENT CALLED AND SAID  HIS PRESCRIPTION OF PANTOPROZOLE NEEDS TO GO TO [facility]

## 2018-06-26 MED ORDER — PANTOPRAZOLE SODIUM 40 MG PO TBEC: 40.0000 mg | DELAYED_RELEASE_TABLET | Freq: Two times a day (BID) | ORAL | 3 refills | Status: DC

## 2018-06-26 NOTE — Addendum Note (Signed)
Addended by: Mahala Menghini on: 06/26/2018 08:32 AM   Modules accepted: Orders

## 2018-07-01 ENCOUNTER — Telehealth: Payer: Self-pay

## 2018-07-01 NOTE — Telephone Encounter (Signed)
Received a call from pts pharmacy. Pt is taking Pantoprazole and they want to make sure L[facility] is aware that it can decrease the effectiveness of pts Plavix.   Call back # [phone] ref #[id number]

## 2018-07-02 NOTE — Telephone Encounter (Signed)
Noted spoke to pt and he is already taking Pantoprazole once daily before  Breakfast. Pt  reports that he is doing well on Pantoprazole once daily.

## 2018-07-02 NOTE — Telephone Encounter (Signed)
Patient has h/o complicated GERD. Please make sure patient decreases his pantoprazole to once daily before breakfast.   Make f/u ov.

## 2018-07-24 DIAGNOSIS — Z9842 Cataract extraction status, left eye: Secondary | ICD-10-CM | POA: Diagnosis not present

## 2018-07-24 DIAGNOSIS — H34231 Retinal artery branch occlusion, right eye: Secondary | ICD-10-CM | POA: Diagnosis not present

## 2018-07-24 DIAGNOSIS — Z961 Presence of intraocular lens: Secondary | ICD-10-CM | POA: Diagnosis not present

## 2018-07-24 DIAGNOSIS — I6389 Other cerebral infarction: Secondary | ICD-10-CM | POA: Diagnosis not present

## 2018-07-24 DIAGNOSIS — H401131 Primary open-angle glaucoma, bilateral, mild stage: Secondary | ICD-10-CM | POA: Diagnosis not present

## 2018-07-24 DIAGNOSIS — Z9841 Cataract extraction status, right eye: Secondary | ICD-10-CM | POA: Diagnosis not present

## 2018-07-24 DIAGNOSIS — H47631 Disorders of visual cortex in (due to) neoplasm, right side of brain: Secondary | ICD-10-CM | POA: Diagnosis not present

## 2018-07-24 DIAGNOSIS — H353132 Nonexudative age-related macular degeneration, bilateral, intermediate dry stage: Secondary | ICD-10-CM | POA: Diagnosis not present

## 2018-07-31 DIAGNOSIS — Z961 Presence of intraocular lens: Secondary | ICD-10-CM | POA: Diagnosis not present

## 2018-08-07 ENCOUNTER — Telehealth: Payer: Self-pay | Admitting: Cardiovascular Disease

## 2018-08-07 NOTE — Telephone Encounter (Signed)
Spoke with [name] and she stated patient said someone called and said to change thyroid medication. Advised Dr Claiborne Billings had not made any changes from last labs in Epic to follow up with PCP to see if they may have called. Shelba verbalized understanding.

## 2018-08-07 NOTE — Telephone Encounter (Signed)
Wife called. PT said he went to the lab in [place], and someone called him with those results, and suggested an increase of his medication levothyroxine (SYNTHROID, LEVOTHROID) 100 MCG tablet  Wife just wants clarification, and if the dosage has increased, she says a new rx needs to be sent to [place], [place] [employer]    1. Which medications need to be refilled? (please list name of each medication and dose if known) levothyroxine (SYNTHROID, LEVOTHROID) 100 MCG tablet  2. Which pharmacy/location (including street and city if local pharmacy) is medication to be sent to? [place], [place]  3. Do they need a 30 day or 90 day supply? [place]

## 2018-08-11 ENCOUNTER — Other Ambulatory Visit (HOSPITAL_COMMUNITY): Payer: Self-pay | Admitting: Ophthalmology

## 2018-08-11 ENCOUNTER — Other Ambulatory Visit: Payer: Self-pay | Admitting: Ophthalmology

## 2018-08-12 ENCOUNTER — Other Ambulatory Visit: Payer: Self-pay | Admitting: Ophthalmology

## 2018-08-12 ENCOUNTER — Other Ambulatory Visit (HOSPITAL_COMMUNITY): Payer: Self-pay | Admitting: Ophthalmology

## 2018-08-12 DIAGNOSIS — H47013 Ischemic optic neuropathy, bilateral: Secondary | ICD-10-CM

## 2018-08-16 ENCOUNTER — Other Ambulatory Visit: Payer: Self-pay | Admitting: Cardiovascular Disease

## 2018-08-24 ENCOUNTER — Telehealth: Payer: Self-pay | Admitting: Cardiovascular Disease

## 2018-08-24 NOTE — Telephone Encounter (Signed)
Called and spoke with patient wife, advised of recommendations.  Patient verbalized understanding, will call back if issues.

## 2018-08-24 NOTE — Telephone Encounter (Signed)
Recommendation:  1. HOLD amlodipine 5mg  for 2 days, then resume at 1/2 tablet (2.5mg ) daily.  2. Keep leg elevated   3. Monitor BP daily and call back if Bp increase noted.

## 2018-08-24 NOTE — Telephone Encounter (Signed)
Pt c/o swelling: STAT is pt has developed SOB within 24 hours  1) How much weight have you gained and in what time span? No weight   2) If swelling, where is the swelling located? Legs and toes.  3) Are you currently taking a fluid pill? Yes   4) Are you currently SOB? No   5) Do you have a log of your daily weights (if so, list)? No   6) Have you gained 3 pounds in a day or 5 pounds in a week?  Not sure  7) Have you traveled recently? Yes

## 2018-08-24 NOTE — Telephone Encounter (Signed)
Called patient, and spoke to patients wife. She states that her husbands ankles and toes are swollen for a while now, and they can not seem to find out why. Patient wife denies SOB, or weight gain she states that her husband is a small man, but his feet has begun to have problems. They deny diet changes, no excess salt, and he wears compression stockings daily. He is on lasix 40 mg daily, but wife states it does not seem to help with the swelling in his feet. Not able to check BP or HR at this time. Patient wife also states that her husband is sleepy and fatigued all the time, he can fall asleep easily.  I advised I would route a message to PharmD to advise if any medication changes could benefit patient at this time.

## 2018-08-31 ENCOUNTER — Telehealth: Payer: Self-pay | Admitting: Cardiovascular Disease

## 2018-08-31 NOTE — Telephone Encounter (Signed)
Tried to call several times, call would not go through. Will try again later

## 2018-08-31 NOTE — Telephone Encounter (Signed)
Spoke with wife and patient decreased Amlodipine to 5 mg 1/2 tablet daily. She has not notice any change in swelling. Denies any shortness of breath but stated he would not complain. She is concerned about swelling and him sleeping all the time. Per wife he can sit down and just fall asleep reading paper. Will forward to Dr Claiborne Billings for review

## 2018-08-31 NOTE — Telephone Encounter (Signed)
New message   B/p readings per his wife:  08/31/18 131/60  65 hr 08/30/18 116/55 66 hr 08/24/18 94/48 64 hr 08/25/18 86/48 69 hr    Patient wife states that the swelling in feet has not got better per a previous conversation that patient wife states that she has had with the nurse.

## 2018-09-01 NOTE — Telephone Encounter (Signed)
With his pressure being low I would recommend use of support stockings and try to keep his legs elevated.  This point would not further increase diuretic therapy due to prior low blood pressure.  Also be cognizant of sodium intake.

## 2018-09-01 NOTE — Telephone Encounter (Signed)
Called patient, spoke with wife- advised of message from MD.  Wife verbalized understanding.

## 2018-09-08 DIAGNOSIS — M47816 Spondylosis without myelopathy or radiculopathy, lumbar region: Secondary | ICD-10-CM | POA: Diagnosis not present

## 2018-09-08 DIAGNOSIS — M546 Pain in thoracic spine: Secondary | ICD-10-CM | POA: Diagnosis not present

## 2018-09-08 DIAGNOSIS — M545 Low back pain: Secondary | ICD-10-CM | POA: Diagnosis not present

## 2018-09-08 DIAGNOSIS — M5136 Other intervertebral disc degeneration, lumbar region: Secondary | ICD-10-CM | POA: Diagnosis not present

## 2018-09-08 NOTE — Telephone Encounter (Signed)
Patient wife is calling- they are calling because the patient has continued swelling in his feet, he states if he elevates the feet the swelling will go down but as soon he stands the swelling goes back up bad, where he can barley get his shoes on. I advised since the swelling was still worse- suggested office visit with [facility] tomorrow afternoon in office, so Dr.Kelly to look at his legs and feet and see if he can make any adjustments.   Patient wife verbalized understanding. Appointment made.

## 2018-09-08 NOTE — Telephone Encounter (Signed)
Follow up   Patient's wife has additional questions about swelling in patient's feet per previous message. Please call.

## 2018-09-09 ENCOUNTER — Encounter: Payer: Self-pay | Admitting: Cardiovascular Disease

## 2018-09-09 ENCOUNTER — Other Ambulatory Visit: Payer: Self-pay

## 2018-09-09 ENCOUNTER — Ambulatory Visit (INDEPENDENT_AMBULATORY_CARE_PROVIDER_SITE_OTHER): Payer: Medicare Other | Admitting: Cardiovascular Disease

## 2018-09-09 ENCOUNTER — Other Ambulatory Visit: Payer: Self-pay | Admitting: Cardiovascular Disease

## 2018-09-09 VITALS — BP 131/66 | HR 59 | Ht 68.0 in | Wt 132.0 lb

## 2018-09-09 DIAGNOSIS — I10 Essential (primary) hypertension: Secondary | ICD-10-CM

## 2018-09-09 DIAGNOSIS — M25473 Effusion, unspecified ankle: Secondary | ICD-10-CM

## 2018-09-09 DIAGNOSIS — I2510 Atherosclerotic heart disease of native coronary artery without angina pectoris: Secondary | ICD-10-CM

## 2018-09-09 DIAGNOSIS — E785 Hyperlipidemia, unspecified: Secondary | ICD-10-CM | POA: Diagnosis not present

## 2018-09-09 DIAGNOSIS — K219 Gastro-esophageal reflux disease without esophagitis: Secondary | ICD-10-CM | POA: Diagnosis not present

## 2018-09-09 DIAGNOSIS — E039 Hypothyroidism, unspecified: Secondary | ICD-10-CM

## 2018-09-09 MED ORDER — FUROSEMIDE 40 MG PO TABS: ORAL_TABLET | ORAL | 3 refills | Status: DC

## 2018-09-09 NOTE — Progress Notes (Signed)
Patient ID: Dale [name], male   DOB: 04/27/1929, 85 y.o.   MRN: [id number]    Primary M.D.: Dr. Redmond School  HPI: Dale ROSSBACH is a 85 y.o. male who presents to the office for a 9 month cardiology evaluation.  Dale [name] has known CAD and underwent initial intervention to his RCA in 2000. In 2009 a Cypher stent was placed beyond the acute margin of his RCA. His last cardiac catheterization was in September 2013 by Dr. [name] which showed 30% LAD narrowing after the second diagonal vessel, and normal left circumflex coronary artery, and patent RCA stents.  He has a history of hypothyroidism on Synthroid replacement and has a history of hyperlipidemia. He had been on on Crestor 5 mg plus Zetia 10 mg; however, due to recent increased cost of Zetia, this was discontinued and he has been on Crestor 20 mg daily.  He also has a history of GERD treated with Protonix, and he continues to be on dual antiplatelet therapy. He is responsive to Plavix on previous P2 Y12 testing.  When I saw him in 2016 he had experienced 3-4 episodes of chest pain over the six-month period.  Most of these episodes have occurred at night and were nitrate responsive.  He states that they were similar to his previous discomfort.  When I saw him, I further titrated his isosorbide mononitrate to 60 mg in the morning and 30 mg at night.    He saw Dale [name], Dale [name] in August 2018 with weight loss, weakness, and vague chest pain.  He felt his chest pain was atypical and not ischemic.  An echo Doppler study on 12/23/2016 showed an EF of 55-60%.  He was hospitalized on August 29 through January 03, 2017 at  [facility].  Apparently he had a nuclear stress test which did not reveal any reversible ischemia.  He was also felt to have acute sinusitis for which he was treated with antibiotics.  He has a history of renal cell CA.Marland Kitchen     When I last saw him in October 2018, his chest pain had resolved. He underwent a carotid duplex study in October  2018 which showed mild plaque bilaterally at 1-39%. He denies increasing episodes of chest pain.  At times he notes rare chest discomfort which is short-lived.  He had developed some gross hematuria and was found to have a kidney stone which he underwent cystoscopy, left ureteral pyelogram with fluoroscopic interpretation, left ureteroscopy, holmium laser lithotripsy and extraction of left mid ureteral stone, placement of 24 x 6 French contour double-J stent, without tether by Dr Dale [name] on 06/16/2017.  I last saw him in March 2019.  Since that time, he has been seen by Dale [name], PAC on 2 occasions in October.  He was seen initially on February 03, 2018 for bilateral ankle edema at which time his hydrochlorothiazide was switched to Lasix.  When seen in follow-up he continued to have ankle edema and Lasix was further titrated to 40 mg and potassium supplementation was increased.  Over the last several months, he has done well.  I last saw him in January 2020.  Several weeks prior to that evaluation he had experienced an episode of sharp chest ache which was nonexertional.    He denies recurrent anginal type symptoms.  He was sleepy during the day but admits that he is sleeping well at night.  He typically goes to bed at 11 PM and wakes up at 7 AM.  Over the past several weeks, he has noticed progressive lower extremity leg swelling.  He apparently has not been taking his Lasix in the morning but has been taking this at 5 PM.  He denies chest pain.  He denies any awareness of rhythm abnormality.  He denies PND orthopnea.  He presents for evaluation.  Past Medical History:  Diagnosis Date  . Allergic rhinitis   . Anal fissure   . Anginal pain ([place])   . Asthma   . Cancer ([place])    skin  . Chest pain   . CHF (congestive heart failure) ([place])   . Coronary heart disease    s/p stenting. cath in 01/2012 noncritical occlusion  . Dysrhythmia    1st degree heart block  . GERD (gastroesophageal reflux  disease)   . Glaucoma   . Hiatal hernia   . Hyperlipidemia   . Hypertension   . Hypothyroidism   . Idiopathic thrombocytopenic purpura ([place]) 2002  . Macular degeneration   . Nephrolithiasis   . PUD (peptic ulcer disease)    remote  . Sarcoidosis    pulmonary  . Schatzki's ring     Past Surgical History:  Procedure Laterality Date  . cardiac stents    . COLONOSCOPY  10/30/2006   Normal rectum, sigmoid diverticula.Remainder of colonic mucosa appeared normal.  . CORONARY ANGIOPLASTY WITH STENT PLACEMENT     about 10 years ago per pt (around 2007)  . CYSTOSCOPY WITH RETROGRADE PYELOGRAM, URETEROSCOPY AND STENT PLACEMENT Left 06/16/2017   Procedure: CYSTOSCOPY WITH RETROGRADE PYELOGRAM, URETEROSCOPY,STONE EXTRACTION  AND STENT PLACEMENT;  Surgeon: Franchot Gallo, MD;  Location: [facility];  Service: Urology;  Laterality: Left;  . ESOPHAGOGASTRODUODENOSCOPY  06/19/2004   Two esophageal rings and esophageal web as described above.  All of these were disrupted by passing 56-French Venia Minks dilator/ Candida esophagitis,which appears to be incidental given history of   antibiotic use, but nevertheless will be treated.  . ESOPHAGOGASTRODUODENOSCOPY  10/30/2006   Distal tandem esophageal ring status post dilation disruption as  described above.  Otherwise normal esophagus/  Small hiatal hernia otherwise normal stomach, D1 and D2  . ESOPHAGOGASTRODUODENOSCOPY N/A 03/22/2015   Dr.Rourk- noncritical schatzki's ring and hiatal hernia-o/w normal EGD.   Marland Kitchen ESOPHAGOGASTRODUODENOSCOPY (EGD) WITH ESOPHAGEAL DILATION  03/04/2012   RMR- schatzki's ring, hiatal hernia, polypoid gastric mucosa, bx= minimally active gastritis.  Marland Kitchen HOLMIUM LASER APPLICATION Left 5/46/5035   Procedure: HOLMIUM LASER APPLICATION;  Surgeon: Franchot Gallo, MD;  Location: [facility];  Service: Urology;  Laterality: Left;  . IR GENERIC HISTORICAL  03/06/2016   IR RADIOLOGIST EVAL & MGMT 03/06/2016 Aletta Edouard, MD [facility] INTERV RAD   . IR GENERIC HISTORICAL  06/18/2016   IR RADIOLOGIST EVAL & MGMT 06/18/2016 Aletta Edouard, MD [facility] INTERV RAD  . IR RADIOLOGIST EVAL & MGMT  10/01/2016  . IR RADIOLOGIST EVAL & MGMT  10/15/2017  . LEFT HEART CATH N/A 02/02/2012   Procedure: LEFT HEART CATH;  Surgeon: Lorretta Harp, MD;  Location: [facility] CATH LAB;  Service: Cardiovascular;  Laterality: N/A;  . MEDIASTINOSCOPY     for dx sarcoid  . RADIOLOGY WITH ANESTHESIA Left 05/17/2016   Procedure: left renal ablation;  Surgeon: Aletta Edouard, MD;  Location: [facility];  Service: Radiology;  Laterality: Left;    Allergies  Allergen Reactions  . Azithromycin Other (See Comments)    Sore mouth and fever blisters around mouth, sores in nose area as well  . Doxazosin Shortness Of Breath  . Acetaminophen  Other (See Comments)    REACTION: UNKNOWN REACTION  . Atenolol Other (See Comments)    REACTION: UNKNOWN REACTION  . Hydrocodone Nausea And Vomiting  . Hydrocodone-Acetaminophen Nausea Only  . Levofloxacin Other (See Comments)    Caused stomach problems.  . Morphine Other (See Comments)    "made me crazy"  . Penicillins Nausea And Vomiting and Other (See Comments)    Has patient had a PCN reaction causing immediate rash, facial/tongue/throat swelling, SOB or lightheadedness with hypotension: No Has patient had a PCN reaction causing severe rash involving mucus membranes or skin necrosis: No Has patient had a PCN reaction that required hospitalization No Has patient had a PCN reaction occurring within the last 10 years: No If all of the above answers are "NO", then may proceed with Cephalosporin use.   . Sulfonamide Derivatives Nausea And Vomiting    Current Outpatient Medications  Medication Sig Dispense Refill  . acetaZOLAMIDE (DIAMOX) 250 MG tablet Take 250 mg by mouth daily.     Marland Kitchen aspirin 81 MG tablet Take 81 mg by mouth 3 (three) times a week.     Marland Kitchen atorvastatin (LIPITOR) 40 MG tablet Take one (1) tablet (40 mg) by mouth every  evening. 90 tablet 1  . clopidogrel (PLAVIX) 75 MG tablet TAKE 1 TABLET BY MOUTH  DAILY 90 tablet 3  . dorzolamide (TRUSOPT) 2 % ophthalmic solution Place 1 drop into both eyes 2 (two) times daily.     . furosemide (LASIX) 40 MG tablet TAKE 1 TABLET(40 MG) BY MOUTH DAILY- take 20 mg additional after 4:00 pm if continued swelling as needed. 90 tablet 3  . guaiFENesin (MUCINEX) 600 MG 12 hr tablet Take 1,200 mg by mouth daily.     . isosorbide mononitrate (IMDUR) 60 MG 24 hr tablet TAKE 1 TABLET BY MOUTH IN  THE MORNING AND 1/2 TABLET  AT NIGHT 135 tablet 3  . latanoprost (XALATAN) 0.005 % ophthalmic solution Place 1 drop into both eyes at bedtime.    Marland Kitchen levothyroxine (SYNTHROID, LEVOTHROID) 100 MCG tablet TAKE 1 TABLET BY MOUTH  DAILY 90 tablet 3  . losartan (COZAAR) 100 MG tablet TAKE 1 TABLET BY MOUTH  DAILY 90 tablet 1  . metoprolol tartrate (LOPRESSOR) 25 MG tablet TAKE ONE-HALF TABLET BY  MOUTH TWICE A DAY 90 tablet 3  . mometasone (ELOCON) 0.1 % ointment Apply 1 application topically daily.     . Multiple Vitamin (MULTIVITAMIN) tablet Take 1 tablet by mouth every evening.     . Multiple Vitamins-Minerals (PRESERVISION/LUTEIN) CAPS Take 1 capsule by mouth 2 (two) times daily.     . nitroGLYCERIN (NITROSTAT) 0.4 MG SL tablet Place 1 tablet (0.4 mg total) under the tongue every 5 (five) minutes as needed. For chest pain 25 tablet 3  . pantoprazole (PROTONIX) 40 MG tablet Take 1 tablet (40 mg total) by mouth 2 (two) times daily before a meal. 180 tablet 3  . potassium chloride SA (K-DUR,KLOR-CON) 20 MEQ tablet Take 1 tablet (20 mEq total) by mouth daily. 30 tablet 2  . timolol (TIMOPTIC) 0.5 % ophthalmic solution Place 1 drop into both eyes daily.     Marland Kitchen triamcinolone (KENALOG) 0.1 % cream Apply 1 application topically 2 (two) times daily as needed (for irritation).     . vitamin C (ASCORBIC ACID) 500 MG tablet Take 500 mg by mouth daily.     . vitamin E 200 UNIT capsule Take 200 Units by mouth  daily at 12 noon.  No current facility-administered medications for this visit.     Social History   Socioeconomic History  . Marital status: Married    Spouse name: Not on file  . Number of children: 1  . Years of education: Not on file  . Highest education level: Not on file  Occupational History  . 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 Financial resource strain: Not on file  . Food insecurity:    Worry: Not on file    Inability: Not on file  . Transportation needs:    Medical: Not on file    Non-medical: Not on file  Tobacco Use  . Smoking status: Former Smoker    Packs/day: 0.10    Years: 2.00    Pack years: 0.20    Types: Cigarettes, Cigars    Last attempt to quit: 05/06/1970    Years since quitting: 48.3  . Smokeless tobacco: Never Used  Substance and Sexual Activity  . Alcohol use: No    Alcohol/week: 0.0 standard drinks  . Drug use: No  . Sexual activity: Never  Lifestyle  . Physical activity:    Days per week: Not on file    Minutes per session: Not on file  . Stress: Not on file  Relationships  . Social connections:    Talks on phone: Not on file    Gets together: Not on file    Attends religious service: Not on file    Active member of club or organization: Not on file    Attends meetings of clubs or organizations: Not on file    Relationship status: Not on file  . Intimate partner violence:    Fear of current or ex partner: Not on file    Emotionally abused: Not on file    Physically abused: Not on file    Forced sexual activity: Not on file  Other Topics Concern  . Not on file  Social History Narrative  . Not on file   Additional social history is notable that he is married and lives with his wife. He quit smoking over 40 years ago. Has one child. He remains active.  Family History  Problem Relation Age of Onset  . Heart disease Father        deceased age 31  . Stroke Mother    . Alzheimer's disease Mother   . Heart attack Brother        deceased age 62  . Cancer Other        niece  . Colon cancer Neg Hx    ROS General: Negative; No fevers, chills, or night sweats; Positive for weight loss HEENT: Positive for recent sinusitis.  No changes in vision., difficulty swallowing Pulmonary: Negative; No cough, wheezing, shortness of breath, hemoptysis Cardiovascular: See history of present illness GI: Negative; No nausea, vomiting, diarrhea, or abdominal pain GU: History of renal cell CA status post cryoablation; status post renal stone removal Musculoskeletal: Negative; no myalgias, joint pain, or weakness Hematologic/Oncology: Negative; no easy bruising, bleeding Endocrine: Positive for hypothyroidism; no diabetes Neuro: Negative; no changes in balance, headaches Skin: Negative; No rashes or skin lesions Psychiatric: Negative; No behavioral problems, depression Sleep: Negative; No snoring, daytime sleepiness, hypersomnolence, bruxism, restless legs, hypnogognic hallucinations, no cataplexy Other comprehensive 14 point system review is negative.   PE BP 131/66   Pulse (!) 59   Ht '5\' 8"'  (1.727 m)   Wt 132 lb (  59.9 kg)   BMI 20.07 kg/m   Repeat blood pressure by me 140/70  Wt Readings from Last 3 Encounters:  09/09/18 132 lb (59.9 kg)  06/02/18 131 lb (59.4 kg)  02/19/18 131 lb (59.4 kg)   General: Alert, oriented, no distress.  Skin: normal turgor, no rashes, warm and dry HEENT: Normocephalic, atraumatic. Pupils equal round and reactive to light; sclera anicteric; extraocular muscles intact;  Nose without nasal septal hypertrophy Mouth/Parynx not assessed patient wearing mask for COVID-19 pandemic Neck: No JVD, no carotid bruits; normal carotid upstroke Lungs: clear to ausculatation and percussion; no wheezing or rales Chest wall: without tenderness to palpitation Heart: PMI not displaced, RRR, s1 s2 normal, 1/6 systolic murmur, no diastolic murmur,  no rubs, gallops, thrills, or heaves Abdomen: soft, nontender; no hepatosplenomehaly, BS+; abdominal aorta nontender and not dilated by palpation. Back: no CVA tenderness Pulses 2+ Musculoskeletal: full range of motion, normal strength, no joint deformities Extremities: 3+ pitting edema or extremity commencing just above the ankles and involving the feet no clubbing cyanosis, Homan's sign negative  Neurologic: grossly nonfocal; Cranial nerves grossly wnl Psychologic: Normal mood and affect   ECG (independently read by me): Sinus bradycardia with first-degree AV block with ventricular rate of 59 bpm.  PR interval 244 ms.  June 02, 2018 ECG (independently read by me): Sinus rhythm at 62 bpm with first-degree block with apparent of what to 40 ms.  No significant ST-T changes.  July 24, 2017 ECG (independently read by me): normal sinus rhythm at 60 bpm with first-degree AV block.  PR interval 236 ms.  QTc interval normal.  No ectopy.  October 2018 ECG (independently read by me): Sinus bradycardia 50 bpm.  First degree AV block with a PR interval at 224 ms.  No syncope ST changes.  September 2017 ECG (independently read by me): Sinus bradycardia 59 bpm.  First-degree AV block with PR interval 240 ms.  No significant ST-T changes.  November 2016 ECG (independently read by me): Sinus bradycardia at 55 beats per minute with first-degree AV block.  PR interval 246 ms.  No significant ST-T changes.  May 2016 ECG (independently read by me): Sinus rhythm with first-degree heart block.  PR interval 226.  No ST segment changes.  February 2016 ECG (independently read by me): Sinus rhythm at 65 bpm.  Mild first-degree AV block with a PR interval 228 ms.  No significant ST segment changes.  December 2015 ECG (independently read by me): Sinus bradycardia 57 bpm.  Borderline first-degree AV block with a PR interval 216 daily.  Seconds.  QTc interval is normal at 373 ms.  June 2015 ECG (independently  read by me): Sinus bradycardia 54 beats per minute.  First degree block with a PR interval of 232 ms.  No significant ST-T changes.  Prior ECG: Sinus bradycardia 54 beats per minute. PR interval 206 ms. QTc interval normal  LABS: BMP Latest Ref Rng & Units 06/12/2018 03/03/2018 02/10/2018  Glucose 65 - 99 mg/dL 100(H) 107(H) 105(H)  BUN 8 - 27 mg/dL 28(H) 23 21  Creatinine 0.76 - 1.27 mg/dL 0.91 0.68(L) 0.79  BUN/Creat Ratio 10 - 24 31(H) 34(H) 27(H)  Sodium 134 - 144 mmol/L 139 140 141  Potassium 3.5 - 5.2 mmol/L 4.2 4.1 4.1  Chloride 96 - 106 mmol/L 109(H) 105 107(H)  CO2 20 - 29 mmol/L '26 21 20  ' Calcium 8.6 - 10.2 mg/dL 9.4 9.4 8.8   Hepatic Function Latest Ref Rng & Units 06/12/2018 09/08/2015  07/07/2014  Total Protein 6.0 - 8.5 g/dL 6.4 6.3 6.5  Albumin 3.6 - 4.6 g/dL 4.1 3.9 4.2  AST 0 - 40 IU/L '22 24 22  ' ALT 0 - 44 IU/L '23 20 19  ' Alk Phosphatase 39 - 117 IU/L 97 76 73  Total Bilirubin 0.0 - 1.2 mg/dL 0.3 0.7 0.4  Bilirubin, Direct - - - -   CBC Latest Ref Rng & Units 06/12/2018 05/27/2017 04/07/2017  WBC 3.4 - 10.8 x10E3/uL 4.7 5.3 5.5  Hemoglobin 13.0 - 17.7 g/dL 10.8(L) 11.0(L) 11.4(L)  Hematocrit 37.5 - 51.0 % 31.3(L) 32.4(L) 35.2(L)  Platelets 150 - 450 x10E3/uL 189 198 195   Lab Results  Component Value Date   MCV 98 (H) 06/12/2018   MCV 101.9 (H) 05/27/2017   MCV 106.7 (H) 04/07/2017   Lab Results  Component Value Date   TSH 5.870 (H) 06/12/2018  No results found for: HGBA1C   Lipid Panel     Component Value Date/Time   CHOL 111 06/12/2018 1034   TRIG 66 06/12/2018 1034   HDL 50 06/12/2018 1034   CHOLHDL 2.2 06/12/2018 1034   CHOLHDL 2.6 01/02/2017 0612   VLDL 10 01/02/2017 0612   LDLCALC 48 06/12/2018 1034   RADIOLOGY: No results found.  IMPRESSION:  1. Essential hypertension   2. Ankle edema   3. Hyperlipidemia with target LDL less than 70   4. Coronary artery disease involving native coronary artery of native heart without angina pectoris   5.  Hypothyroidism, unspecified type   6. Gastroesophageal reflux disease without esophagitis     ASSESSMENT AND PLAN: Mr. Trampus Mcquerry is an 85 years old gentleman who has known CAD and underwent initial intervention to his RCA in 2000 and subsequent intervention in 2009. At last catheterization in 2013 his RCA stents were patent and he had mild 30% LAD narrowing.  He has had some issues recently with leg edema which has necessitated changing his diuretic regimen to furosemide in place of hydrochlorothiazide.  When last seen, his blood pressure was stable on amlodipine 5 mg, furosemide 40 mg, isosorbide 60 mg in the morning and 30 mg at night in addition to his losartan 100 mg daily and metoprolol tartrate 12.5 mg twice daily.  Rhythm is stable with sinus bradycardia at 59 bpm with first-degree AV block.  He now has significant 2-3+ ankle edema bilaterally.  I have recommended he discontinue amlodipine since he already had self reduced this to 2.5 mg.  I have recommended he change his furosemide and take 40 mg in the morning and at in for the next 3 days take an additional 20 mg in the afternoon.  If he continues to experience leg swelling he will continue with the 20 mg afternoon dose or otherwise can take this every other day or as needed depending upon edema.  He will monitor his blood pressure.  If blood pressure continues to be increased despite the increased Lasix, additional medication management will be necessary.  He has a history of hypothyroidism and continues to take levothyroxine 100 mcg.  His last echo Doppler study in August 2018 showed normal EF at 55 to 60% without regional wall motion abnormalities and he had normal diastolic parameters.  He continues to be on aspirin and Plavix and is tolerating this well without bleeding.  He has not had any further kidney stones.  He has GERD controlled with pantoprazole.  I reviewed recent laboratory.  I will see him in 2 months for reevaluation  or sooner if  problems arise. Time spent: 25 minutes  Troy Sine, MD, [facility]  09/09/2018 6:13 PM

## 2018-09-09 NOTE — Telephone Encounter (Signed)
Amlodipine and clopidogrel refilled.

## 2018-09-09 NOTE — Patient Instructions (Signed)
Medication Instructions:  Take Lasix 40 mg in the morning. For the next 3 days if swelling is still in feet at around 4:00 pm take an additional 20 mg.  Then take the additional 20 mg extra as needed only after 3 days.   Stop Amlodipine  If you need a refill on your cardiac medications before your next appointment, please call your pharmacy.   Follow-Up: At [facility], you and your health needs are our priority.  As part of our continuing mission to provide you with exceptional heart care, we have created designated Provider Care Teams.  These Care Teams include your primary Cardiologist (physician) and Advanced Practice Providers (APPs -  Physician Assistants and Nurse Practitioners) who all work together to provide you with the care you need, when you need it. You will need a follow up appointment in 2 months. You may see Shelva Majestic, MD or one of the following Advanced Practice Providers on your designated Care Team: [place], [place] . Fabian Sharp, PA-C

## 2018-09-11 ENCOUNTER — Ambulatory Visit (HOSPITAL_COMMUNITY): Payer: Medicare Other

## 2018-09-11 ENCOUNTER — Other Ambulatory Visit (HOSPITAL_COMMUNITY): Payer: Medicare Other

## 2018-09-15 DIAGNOSIS — Z0000 Encounter for general adult medical examination without abnormal findings: Secondary | ICD-10-CM | POA: Diagnosis not present

## 2018-09-15 DIAGNOSIS — Z1389 Encounter for screening for other disorder: Secondary | ICD-10-CM | POA: Diagnosis not present

## 2018-09-15 DIAGNOSIS — Z681 Body mass index (BMI) 19 or less, adult: Secondary | ICD-10-CM | POA: Diagnosis not present

## 2018-09-21 ENCOUNTER — Telehealth: Payer: Self-pay | Admitting: Cardiovascular Disease

## 2018-09-21 NOTE — Telephone Encounter (Signed)
New message   Pt c/o medication issue:  1. Name of Medication: levothyroxine (SYNTHROID, LEVOTHROID) 100 MCG tablet  2. How are you currently taking this medication (dosage and times per day)? 1 time daily  3. Are you having a reaction (difficulty breathing--STAT)? No   4. What is your medication issue? Patient's wife is calling to verify the dosage of this medication. Please advise.

## 2018-09-21 NOTE — Telephone Encounter (Signed)
Spoke with pt's wife and explained per chart review, pt is to be taking levothyroxine 100 mcg daily. Wife voiced understanding.

## 2018-10-06 ENCOUNTER — Other Ambulatory Visit: Payer: Self-pay

## 2018-10-06 ENCOUNTER — Ambulatory Visit (HOSPITAL_COMMUNITY)
Admission: RE | Admit: 2018-10-06 | Discharge: 2018-10-06 | Disposition: A | Discharge status: Home / Self Care | Payer: Medicare Other | Source: Ambulatory Visit | Attending: Ophthalmology | Admitting: Ophthalmology

## 2018-10-06 DIAGNOSIS — H47013 Ischemic optic neuropathy, bilateral: Secondary | ICD-10-CM

## 2018-10-06 DIAGNOSIS — G319 Degenerative disease of nervous system, unspecified: Secondary | ICD-10-CM | POA: Diagnosis not present

## 2018-10-06 DIAGNOSIS — H47293 Other optic atrophy, bilateral: Secondary | ICD-10-CM | POA: Diagnosis not present

## 2018-10-06 DIAGNOSIS — Z9842 Cataract extraction status, left eye: Secondary | ICD-10-CM | POA: Diagnosis not present

## 2018-10-06 DIAGNOSIS — Z9841 Cataract extraction status, right eye: Secondary | ICD-10-CM | POA: Diagnosis not present

## 2018-10-06 LAB — POCT I-STAT CREATININE: Creatinine, Ser: 0.9 mg/dL (ref 0.61–1.24)

## 2018-10-06 MED ORDER — GADOBUTROL 1 MMOL/ML IV SOLN
7.0000 mL | Freq: Once | INTRAVENOUS | Status: AC | PRN
  Administered 2018-10-06: 7 mL via INTRAVENOUS

## 2018-10-08 ENCOUNTER — Other Ambulatory Visit: Payer: Self-pay | Admitting: Cardiovascular Disease

## 2018-10-19 DIAGNOSIS — L0202 Furuncle of face: Secondary | ICD-10-CM | POA: Diagnosis not present

## 2018-10-19 DIAGNOSIS — L570 Actinic keratosis: Secondary | ICD-10-CM | POA: Diagnosis not present

## 2018-10-19 DIAGNOSIS — D225 Melanocytic nevi of trunk: Secondary | ICD-10-CM | POA: Diagnosis not present

## 2018-10-19 DIAGNOSIS — L298 Other pruritus: Secondary | ICD-10-CM | POA: Diagnosis not present

## 2018-10-19 DIAGNOSIS — X32XXXD Exposure to sunlight, subsequent encounter: Secondary | ICD-10-CM | POA: Diagnosis not present

## 2018-10-23 ENCOUNTER — Telehealth: Payer: Self-pay | Admitting: Cardiovascular Disease

## 2018-10-23 NOTE — Telephone Encounter (Signed)
New Message   Patients wife is calling because he is having increase in swelling in his feet to the point where he had to buy new shoes. She also states he will not elevate his feet at all.   Pt c/o swelling: STAT is pt has developed SOB within 24 hours  1) How much weight have you gained and in what time span? Unknown patient does not weigh himself daily  2) If swelling, where is the swelling located? feet  3) Are you currently taking a fluid pill? yes  4) Are you currently SOB? no  5) Do you have a log of your daily weights (if so, list)? no  6) Have you gained 3 pounds in a day or 5 pounds in a week? unknown  7) Have you traveled recently? no

## 2018-10-23 NOTE — Telephone Encounter (Signed)
Returned call to patient's wife. She reports patient has feet and toe swelling daily. He does not weigh, he has NO shortness of breath. He monitors salt/sodium intake. Per wife, patient does not elevate his feet and she states he is on the go often. The swelling will sometimes improve overnight.   Confirmed that patient is taking lasix 40mg  daily but he has not been using additional 20mg  PRN as listed on medication instructions. Advised that he try this for relief of edema.   No known HF diagnosis - last EF was normal by echo and diastolic function WNL  Routed to MD

## 2018-10-26 NOTE — Telephone Encounter (Signed)
Agree; consider support socks

## 2018-10-26 NOTE — Telephone Encounter (Signed)
Attempted to contact patient to agree with recommendations and to use compression stockings, no answer line was busy.

## 2018-10-30 DIAGNOSIS — I700 Atherosclerosis of aorta: Secondary | ICD-10-CM | POA: Diagnosis not present

## 2018-10-30 DIAGNOSIS — Z681 Body mass index (BMI) 19 or less, adult: Secondary | ICD-10-CM | POA: Diagnosis not present

## 2018-10-30 DIAGNOSIS — I8312 Varicose veins of left lower extremity with inflammation: Secondary | ICD-10-CM | POA: Diagnosis not present

## 2018-10-30 DIAGNOSIS — I10 Essential (primary) hypertension: Secondary | ICD-10-CM | POA: Diagnosis not present

## 2018-10-30 DIAGNOSIS — I872 Venous insufficiency (chronic) (peripheral): Secondary | ICD-10-CM | POA: Diagnosis not present

## 2018-10-30 DIAGNOSIS — R600 Localized edema: Secondary | ICD-10-CM | POA: Diagnosis not present

## 2018-11-19 ENCOUNTER — Other Ambulatory Visit: Payer: Self-pay | Admitting: Cardiovascular Disease

## 2018-11-24 ENCOUNTER — Telehealth: Payer: Self-pay | Admitting: Cardiovascular Disease

## 2018-11-24 ENCOUNTER — Ambulatory Visit (INDEPENDENT_AMBULATORY_CARE_PROVIDER_SITE_OTHER): Payer: Medicare Other | Admitting: Cardiovascular Disease

## 2018-11-24 ENCOUNTER — Other Ambulatory Visit: Payer: Self-pay

## 2018-11-24 ENCOUNTER — Encounter: Payer: Self-pay | Admitting: Cardiovascular Disease

## 2018-11-24 VITALS — BP 141/70 | HR 68 | Temp 98.4°F | Ht 68.0 in | Wt 139.2 lb

## 2018-11-24 DIAGNOSIS — I440 Atrioventricular block, first degree: Secondary | ICD-10-CM | POA: Diagnosis not present

## 2018-11-24 DIAGNOSIS — M25473 Effusion, unspecified ankle: Secondary | ICD-10-CM | POA: Diagnosis not present

## 2018-11-24 DIAGNOSIS — I2510 Atherosclerotic heart disease of native coronary artery without angina pectoris: Secondary | ICD-10-CM

## 2018-11-24 DIAGNOSIS — I493 Ventricular premature depolarization: Secondary | ICD-10-CM

## 2018-11-24 DIAGNOSIS — I10 Essential (primary) hypertension: Secondary | ICD-10-CM | POA: Diagnosis not present

## 2018-11-24 DIAGNOSIS — E039 Hypothyroidism, unspecified: Secondary | ICD-10-CM | POA: Diagnosis not present

## 2018-11-24 DIAGNOSIS — E785 Hyperlipidemia, unspecified: Secondary | ICD-10-CM | POA: Diagnosis not present

## 2018-11-24 MED ORDER — METOPROLOL TARTRATE 25 MG PO TABS: ORAL_TABLET | ORAL | 3 refills | Status: DC

## 2018-11-24 NOTE — Progress Notes (Signed)
Patient ID: Dale [name], male   DOB: April 03, 1929, 85 y.o.   MRN: [id number]    Primary M.D.: Dr. Redmond School  HPI: Dale [name] is a 85 y.o. male who presents to the office for a 2 month cardiology evaluation.  [name] has known CAD and underwent initial intervention to his RCA in 2000. In 2009 a Cypher stent was placed beyond the acute margin of his RCA. His last cardiac catheterization was in September 2013 by Dr. Gwenlyn [name] which showed 30% LAD narrowing after the second diagonal vessel, and normal left circumflex coronary artery, and patent RCA stents.  He has a history of hypothyroidism on Synthroid replacement and has a history of hyperlipidemia. He had been on on Crestor 5 mg plus Zetia 10 mg; however, due to recent increased cost of Zetia, this was discontinued and he has been on Crestor 20 mg daily.  He also has a history of GERD treated with Protonix, and he continues to be on dual antiplatelet therapy. He is responsive to Plavix on previous P2 Y12 testing.  When I saw him in 2016 he had experienced 3-4 episodes of chest pain over the six-month period.  Most of these episodes have occurred at night and were nitrate responsive.  He states that they were similar to his previous discomfort.  When I saw him, I further titrated his isosorbide mononitrate to 60 mg in the morning and 30 mg at night.    He saw [name], [place] in August 2018 with weight loss, weakness, and vague chest pain.  He felt his chest pain was atypical and not ischemic.  An echo Doppler study on 12/23/2016 showed an EF of 55-60%.  He was hospitalized on August 29 through January 03, 2017 at  [facility]  Apparently he had a nuclear stress test which did not reveal any reversible ischemia.  He was also felt to have acute sinusitis for which he was treated with antibiotics.  He has a history of renal cell CA.Dale [name]     When I last saw him in October 2018, his chest pain had resolved. He underwent a carotid duplex study in October  2018 which showed mild plaque bilaterally at 1-39%. He denies increasing episodes of chest pain.  At times he notes rare chest discomfort which is short-lived.  He had developed some gross hematuria and was [name] to have a kidney stone which he underwent cystoscopy, left ureteral pyelogram with fluoroscopic interpretation, left ureteroscopy, holmium laser lithotripsy and extraction of left mid ureteral stone, placement of 24 x 6 French contour double-J stent, without tether by Dr Dale [name] on 06/16/2017.  I  saw him in March 2019.  Since that time, he has been seen by [name], PAC on 2 occasions in October.  He was seen initially on February 03, 2018 for bilateral ankle edema at which time his hydrochlorothiazide was switched to Lasix.  When seen in follow-up he continued to have ankle edema and Lasix was further titrated to 40 mg and potassium supplementation was increased.  I  saw him in January 2020.  Several weeks prior to that evaluation he had experienced an episode of sharp chest ache which was nonexertional.    He denied recurrent anginal type symptoms.  He was sleepy during the day but admits that he is sleeping well at night.  He typically goes to bed at 11 PM and wakes up at 7 AM.    When I last saw him in May 2020, he  had noticed progressive lower extremity leg edema for several weeks previously.  He apparently was not taking his Lasix in the morning but had been taking this at 5 PM.  He denied chest pain, awareness of abnormal heart rhythm, PND orthopnea.  During that evaluation, I suggested that he take his Lasix 40 mg in the morning and for the next several days take 20 mg in the later afternoon and if edema persisted to continue to take the Lasix perhaps every other day in the afternoon as well.  He states typically he does not take his Lasix in the morning but typically takes his 40 mg at lunchtime.  He then waits till suppertime around 7 PM to take an additional 40 mg of Lasix which he has been  doing on most days.  He urinates until 10 PM.  He has not been taking the Lasix as soon as he wakes up because then he states he cannot do anything without only he continues to experience ankle swelling bilaterally left greater than right.  He has not been using support stockings.  At times he may notice a mild arrhythmia.  He denies presyncope or syncope or recurrent anginal symptoms.  He presents for evaluation.  Past Medical History:  Diagnosis Date   Allergic rhinitis    Anal fissure    Anginal pain (HCC)    Asthma    Cancer (HCC)    skin   Chest pain    CHF (congestive heart failure) ([place])    Coronary heart disease    s/p stenting. cath in 01/2012 noncritical occlusion   Dysrhythmia    1st degree heart block   GERD (gastroesophageal reflux disease)    Glaucoma    Hiatal hernia    Hyperlipidemia    Hypertension    Hypothyroidism    Idiopathic thrombocytopenic purpura (Dale [name]) 2002   Macular degeneration    Nephrolithiasis    PUD (peptic ulcer disease)    remote   Sarcoidosis    pulmonary   Schatzki's ring     Past Surgical History:  Procedure Laterality Date   cardiac stents     COLONOSCOPY  10/30/2006   Normal rectum, sigmoid diverticula.Remainder of colonic mucosa appeared normal.   CORONARY ANGIOPLASTY WITH STENT PLACEMENT     about 10 years ago per pt (around 2007)   Dale [name], URETEROSCOPY AND STENT PLACEMENT Left 06/16/2017   Procedure: CYSTOSCOPY WITH RETROGRADE PYELOGRAM, URETEROSCOPY,STONE EXTRACTION  AND STENT PLACEMENT;  Surgeon: Franchot Gallo, MD;  Location: [facility];  Service: Urology;  Laterality: Left;   ESOPHAGOGASTRODUODENOSCOPY  06/19/2004   Two esophageal rings and esophageal web as described above.  All of these were disrupted by passing 56-French Venia Minks dilator/ Candida esophagitis,which appears to be incidental given history of   antibiotic use, but nevertheless will be treated.    ESOPHAGOGASTRODUODENOSCOPY  10/30/2006   Distal tandem esophageal ring status post dilation disruption as  described above.  Otherwise normal esophagus/  Small hiatal hernia otherwise normal stomach, D1 and D2   ESOPHAGOGASTRODUODENOSCOPY N/A 03/22/2015   Dr.Rourk- noncritical schatzki's ring and hiatal hernia-o/w normal EGD.    ESOPHAGOGASTRODUODENOSCOPY (EGD) WITH ESOPHAGEAL DILATION  03/04/2012   RMR- schatzki's ring, hiatal hernia, polypoid gastric mucosa, bx= minimally active gastritis.   HOLMIUM LASER APPLICATION Left 2/99/2426   Procedure: HOLMIUM LASER APPLICATION;  Surgeon: Franchot Gallo, MD;  Location: [facility];  Service: Urology;  Laterality: Left;   IR GENERIC HISTORICAL  03/06/2016   IR RADIOLOGIST EVAL &  MGMT 03/06/2016 Aletta Edouard, MD [facility] INTERV RAD   IR GENERIC HISTORICAL  06/18/2016   IR RADIOLOGIST EVAL & MGMT 06/18/2016 Aletta Edouard, MD [facility] INTERV RAD   IR RADIOLOGIST EVAL & MGMT  10/01/2016   IR RADIOLOGIST EVAL & MGMT  10/15/2017   LEFT HEART CATH N/A 02/02/2012   Procedure: LEFT HEART CATH;  Surgeon: Lorretta Harp, MD;  Location: [facility] CATH LAB;  Service: Cardiovascular;  Laterality: N/A;   MEDIASTINOSCOPY     for dx sarcoid   RADIOLOGY WITH ANESTHESIA Left 05/17/2016   Procedure: left renal ablation;  Surgeon: Aletta Edouard, MD;  Location: [facility];  Service: Radiology;  Laterality: Left;    Allergies  Allergen Reactions   Azithromycin Other (See Comments)    Sore mouth and fever blisters around mouth, sores in nose area as well   Doxazosin Shortness Of Breath   Acetaminophen Other (See Comments)    REACTION: UNKNOWN REACTION   Atenolol Other (See Comments)    REACTION: UNKNOWN REACTION   Hydrocodone Nausea And Vomiting   Hydrocodone-Acetaminophen Nausea Only   Levofloxacin Other (See Comments)    Caused stomach problems.   Morphine Other (See Comments)    "made me crazy"   Penicillins Nausea And Vomiting and Other (See Comments)     Has patient had a PCN reaction causing immediate rash, facial/tongue/throat swelling, SOB or lightheadedness with hypotension: No Has patient had a PCN reaction causing severe rash involving mucus membranes or skin necrosis: No Has patient had a PCN reaction that required hospitalization No Has patient had a PCN reaction occurring within the last 10 years: No If all of the above answers are "NO", then may proceed with Cephalosporin use.    Sulfonamide Derivatives Nausea And Vomiting    Current Outpatient Medications  Medication Sig Dispense Refill   acetaZOLAMIDE (DIAMOX) 250 MG tablet Take 250 mg by mouth daily.      aspirin 81 MG tablet Take 81 mg by mouth 3 (three) times a week.      atorvastatin (LIPITOR) 40 MG tablet TAKE 1 TABLET BY MOUTH  EVERY EVENING 90 tablet 1   clopidogrel (PLAVIX) 75 MG tablet TAKE 1 TABLET BY MOUTH  DAILY 90 tablet 3   dorzolamide (TRUSOPT) 2 % ophthalmic solution Place 1 drop into both eyes 2 (two) times daily.      furosemide (LASIX) 40 MG tablet TAKE 1 TABLET(40 MG) BY MOUTH DAILY- take 20 mg additional after 4:00 pm if continued swelling as needed. 90 tablet 3   guaiFENesin (MUCINEX) 600 MG 12 hr tablet Take 1,200 mg by mouth daily.      isosorbide mononitrate (IMDUR) 60 MG 24 hr tablet TAKE 1 TABLET BY MOUTH IN  THE MORNING AND 1/2 TABLET  AT NIGHT 135 tablet 3   latanoprost (XALATAN) 0.005 % ophthalmic solution Place 1 drop into both eyes at bedtime.     levothyroxine (SYNTHROID) 100 MCG tablet TAKE 1 TABLET BY MOUTH  DAILY 90 tablet 3   losartan (COZAAR) 100 MG tablet TAKE 1 TABLET BY MOUTH  DAILY 90 tablet 1   metoprolol tartrate (LOPRESSOR) 25 MG tablet Take 1 tablet (25 mg) in the morning, and 0.5 tablet (12.5 mg) in the evening. 90 tablet 3   mometasone (ELOCON) 0.1 % ointment Apply 1 application topically daily.      Multiple Vitamin (MULTIVITAMIN) tablet Take 1 tablet by mouth every evening.      Multiple Vitamins-Minerals  (PRESERVISION/LUTEIN) CAPS Take 1 capsule by mouth  2 (two) times daily.      nitroGLYCERIN (NITROSTAT) 0.4 MG SL tablet Place 1 tablet (0.4 mg total) under the tongue every 5 (five) minutes as needed. For chest pain 25 tablet 3   pantoprazole (PROTONIX) 40 MG tablet Take 1 tablet (40 mg total) by mouth 2 (two) times daily before a meal. 180 tablet 3   potassium chloride SA (K-DUR,KLOR-CON) 20 MEQ tablet Take 1 tablet (20 mEq total) by mouth daily. 30 tablet 2   timolol (TIMOPTIC) 0.5 % ophthalmic solution Place 1 drop into both eyes daily.      triamcinolone (KENALOG) 0.1 % cream Apply 1 application topically 2 (two) times daily as needed (for irritation).      vitamin C (ASCORBIC ACID) 500 MG tablet Take 500 mg by mouth daily.      vitamin E 200 UNIT capsule Take 200 Units by mouth daily at 12 noon.      No current facility-administered medications for this visit.     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    Employer: [employer] resource strain: Not on file   Food insecurity    Worry: Not on file    Inability: Not on file   Transportation needs    Medical: Not on file    Non-medical: Not on file  Tobacco Use   Smoking status: Former Smoker    Packs/day: 0.10    Years: 2.00    Pack years: 0.20    Types: Cigarettes, Cigars    Quit date: 05/06/1970    Years since quitting: 48.5   Smokeless tobacco: Never Used  Substance and Sexual Activity   Alcohol use: No    Alcohol/week: 0.0 standard drinks   Drug use: No   Sexual activity: Never  Lifestyle   Physical activity    Days per week: Not on file    Minutes per session: Not on file   Stress: Not on file  Relationships   Social connections     Talks on phone: Not on file    Gets together: Not on file    Attends religious service: Not on file    Active member of club or organization: Not on file    Attends meetings of clubs or organizations: Not on file    Relationship status: Not on file   Intimate partner violence    Fear of current or ex partner: Not on file    Emotionally abused: Not on file    Physically abused: Not on file    Forced sexual activity: Not on file  Other Topics Concern   Not on file  Social History Narrative   Not on file   Additional social history is notable that he is married and lives with his wife. He quit smoking over 40 years ago. Has one child. He remains active.  Family History  Problem Relation Age of Onset   Heart disease Father        deceased age 75   Stroke Mother    Alzheimer's disease Mother    Heart attack Brother        deceased age 90   Cancer Other  niece   Colon cancer Neg Hx    ROS General: Negative; No fevers, chills, or night sweats; Positive for weight loss HEENT: Positive for recent sinusitis.  No changes in vision., difficulty swallowing Pulmonary: Negative; No cough, wheezing, shortness of breath, hemoptysis Cardiovascular: See history of present illness GI: Negative; No nausea, vomiting, diarrhea, or abdominal pain GU: History of renal cell CA status post cryoablation; status post renal stone removal Musculoskeletal: Negative; no myalgias, joint pain, or weakness Hematologic/Oncology: Negative; no easy bruising, bleeding Endocrine: Positive for hypothyroidism; no diabetes Neuro: Negative; no changes in balance, headaches Skin: Negative; No rashes or skin lesions Psychiatric: Negative; No behavioral problems, depression Sleep: Negative; No snoring, daytime sleepiness, hypersomnolence, bruxism, restless legs, hypnogognic hallucinations, no cataplexy Other comprehensive 14 point system review is negative.   PE BP (!) 141/70    Pulse 68    Temp 98.4  F (36.9 C) (Temporal)    Ht _0  (1.727 m)    Wt 139 lb 3.2 oz (63.1 kg)    SpO2 100%    BMI 21.17 kg/m    Repeat blood pressure 140/70  Wt Readings from Last 3 Encounters:  11/24/18 139 lb 3.2 oz (63.1 kg)  09/09/18 132 lb (59.9 kg)  06/02/18 131 lb (59.4 kg)   General: Alert, oriented, no distress.  Skin: normal turgor, no rashes, warm and dry HEENT: Normocephalic, atraumatic. Pupils equal round and reactive to light; sclera anicteric; extraocular muscles intact;  Nose without nasal septal hypertrophy Mouth/Parynx not assessed because of wearing a mask with COVID-19 pandemic Neck: No JVD, no carotid bruits; normal carotid upstroke Lungs: clear to ausculatation and percussion; no wheezing or rales Chest wall: without tenderness to palpitation Heart: PMI not displaced, frequent ectopy, s1 s2 normal, 1/6 systolic murmur, no diastolic murmur, no rubs, gallops, thrills, or heaves Abdomen: soft, nontender; no hepatosplenomehaly, BS+; abdominal aorta nontender and not dilated by palpation. Back: no CVA tenderness Pulses 2+ Musculoskeletal: full range of motion, normal strength, no joint deformities Extremities: 1-2+ pitting edema of his ankles bilaterally with trace to 1+ pretibial bilateral edema; no clubbing, cyanosis, Homan's sign negative  Neurologic: grossly nonfocal; Cranial nerves grossly wnl Psychologic: Normal mood and affect  ECG (independently read by me):Sinus rhythm at 68 bpm with first-degree AV block ( PR interval 236 msec) and frequent unifocal PVCs  Sep 09, 2018 ECG (independently read by me): Sinus bradycardia with first-degree AV block with ventricular rate of 59 bpm.  PR interval 244 ms.  June 02, 2018 ECG (independently read by me): Sinus rhythm at 62 bpm with first-degree block with apparent of what to 40 ms.  No significant ST-T changes.  July 24, 2017 ECG (independently read by me): normal sinus rhythm at 60 bpm with first-degree AV block.  PR interval 236  ms.  QTc interval normal.  No ectopy.  October 2018 ECG (independently read by me): Sinus bradycardia 50 bpm.  First degree AV block with a PR interval at 224 ms.  No syncope ST changes.  September 2017 ECG (independently read by me): Sinus bradycardia 59 bpm.  First-degree AV block with PR interval 240 ms.  No significant ST-T changes.  November 2016 ECG (independently read by me): Sinus bradycardia at 55 beats per minute with first-degree AV block.  PR interval 246 ms.  No significant ST-T changes.  May 2016 ECG (independently read by me): Sinus rhythm with first-degree heart block.  PR interval 226.  No ST segment changes.  February 2016 ECG (independently read  by me): Sinus rhythm at 65 bpm.  Mild first-degree AV block with a PR interval 228 ms.  No significant ST segment changes.  December 2015 ECG (independently read by me): Sinus bradycardia 57 bpm.  Borderline first-degree AV block with a PR interval 216 daily.  Seconds.  QTc interval is normal at 373 ms.  June 2015 ECG (independently read by me): Sinus bradycardia 54 beats per minute.  First degree block with a PR interval of 232 ms.  No significant ST-T changes.  Prior ECG: Sinus bradycardia 54 beats per minute. PR interval 206 ms. QTc interval normal  LABS: BMP Latest Ref Rng & Units 10/06/2018 06/12/2018 03/03/2018  Glucose 65 - 99 mg/dL - 100(H) 107(H)  BUN 8 - 27 mg/dL - 28(H) 23  Creatinine 0.61 - 1.24 mg/dL 0.90 0.91 0.68(L)  BUN/Creat Ratio 10 - 24 - 31(H) 34(H)  Sodium 134 - 144 mmol/L - 139 140  Potassium 3.5 - 5.2 mmol/L - 4.2 4.1  Chloride 96 - 106 mmol/L - 109(H) 105  CO2 20 - 29 mmol/L - 26 21  Calcium 8.6 - 10.2 mg/dL - 9.4 9.4   Hepatic Function Latest Ref Rng & Units 06/12/2018 09/08/2015 07/07/2014  Total Protein 6.0 - 8.5 g/dL 6.4 6.3 6.5  Albumin 3.6 - 4.6 g/dL 4.1 3.9 4.2  AST 0 - 40 IU/L _0 ALT 0 - 44 IU/L _1 Alk Phosphatase 39 - 117 IU/L 97 76 73  Total Bilirubin 0.0 - 1.2 mg/dL 0.3 0.7 0.4    Bilirubin, Direct - - - -   CBC Latest Ref Rng & Units 06/12/2018 05/27/2017 04/07/2017  WBC 3.4 - 10.8 x10E3/uL 4.7 5.3 5.5  Hemoglobin 13.0 - 17.7 g/dL 10.8(L) 11.0(L) 11.4(L)  Hematocrit 37.5 - 51.0 % 31.3(L) 32.4(L) 35.2(L)  Platelets 150 - 450 x10E3/uL 189 198 195   Lab Results  Component Value Date   MCV 98 (H) 06/12/2018   MCV 101.9 (H) 05/27/2017   MCV 106.7 (H) 04/07/2017   Lab Results  Component Value Date   TSH 5.870 (H) 06/12/2018  No results [name] for: HGBA1C   Lipid Panel     Component Value Date/Time   CHOL 111 06/12/2018 1034   TRIG 66 06/12/2018 1034   HDL 50 06/12/2018 1034   CHOLHDL 2.2 06/12/2018 1034   CHOLHDL 2.6 01/02/2017 0612   VLDL 10 01/02/2017 0612   LDLCALC 48 06/12/2018 1034   RADIOLOGY: No results [name].  IMPRESSION:  1. Essential hypertension   2. PVC's (premature ventricular contractions)   3. Ankle edema   4. Hyperlipidemia with target LDL less than 70   5. Coronary artery disease involving native coronary artery of native heart without angina pectoris   6. First degree AV block   7. Hypothyroidism, unspecified type     ASSESSMENT AND PLAN: Mr. Dyllan Hughett is an 85 years old gentleman who has known CAD and underwent initial intervention to his RCA in 2000 and subsequent intervention in 2009. At last catheterization in 2013 his RCA stents were patent and he had mild 30% LAD narrowing.  He has had some issues recently with leg edema which has necessitated changing his diuretic regimen to furosemide in place of hydrochlorothiazide.  When last seen, his blood pressure was stable on amlodipine 5 mg, furosemide 40 mg, isosorbide 60 mg in the morning and 30 mg at night in addition to his losartan 100 mg daily and metoprolol tartrate 12.5 mg twice daily.  When  I last saw him, he had significant 2-3+ ankle edema bilaterally.  At that time I discontinued amlodipine and increase furosemide to 40 mg in the morning and take possibly 20 to 40 mg as  needed in the afternoon.  Essentially, on most days he has been taking 40 mg twice a day but has been taking this at noon and 7 PM.  I again have suggested he take his initial dose in the early morning when he awakens and take the latter dose approximately 4 5 PM.  His ECG today shows frequent PVCs that are unifocal.  His blood pressure is mildly elevated.  I am recommending slight titration of metoprolol from 12.5 mg twice a day to 25 mg in the morning but continue taking 12.5 mg at night.  I will check a be met, TSH and magnesium level today.  I again recommended support stockings below the knee which will be helpful with his resistant ankle edema.  If he continues to have have significant swelling and laboratory is stable we may need to add perhaps spironolactone to his regimen.  He has not been having significant anginal symptoms but he states that he had taken sublingual nitroglycerin since his last evaluation.  He has hypothyroidism and is on levothyroxine at 100 mcg.  On his last echo in August 2018 he had normal systolic and diastolic parameters.  His GERD is controlled with pantoprazole.  He continues to be on aspirin and Plavix without bleeding.  He continues to be on atorvastatin 40 mg for hyperlipidemia.  I will see him in 3 months for reevaluation  Time spent: 25 minutes  Troy Sine, MD, [facility]  11/24/2018 2:48 PM

## 2018-11-24 NOTE — Patient Instructions (Signed)
Medication Instructions:  Increase Metoprolol to 1 tablet (25 mg) and 0.5 tablet (12.5 mg) in the evening.  If you need a refill on your cardiac medications before your next appointment, please call your pharmacy.   Lab work: BMET, TSH, MAG today. If you have labs (blood work) drawn today and your tests are completely normal, you will receive your results only by: Marland Kitchen MyChart Message (if you have MyChart) OR . A paper copy in the mail If you have any lab test that is abnormal or we need to change your treatment, we will call you to review the results.  Follow-Up: At [facility], you and your health needs are our priority.  As part of our continuing mission to provide you with exceptional heart care, we have created designated Provider Care Teams.  These Care Teams include your primary Cardiologist (physician) and Advanced Practice Providers (APPs -  Physician Assistants and Nurse Practitioners) who all work together to provide you with the care you need, when you need it. You will need a follow up appointment in 3 months. You may see Shelva Majestic, MD or one of the following Advanced Practice Providers on your designated Care Team: [place], [place] . Fabian Sharp, PA-C  Any Other Special Instructions Will Be Listed Below (If Applicable). Please wear support stockings.

## 2018-11-24 NOTE — Telephone Encounter (Signed)
I called pt, no answer and no voicemail. °

## 2018-11-25 LAB — MAGNESIUM: Magnesium: 2.5 mg/dL — ABNORMAL HIGH (ref 1.6–2.3)

## 2018-11-25 LAB — BASIC METABOLIC PANEL
BUN/Creatinine Ratio: 26 — ABNORMAL HIGH (ref 10–24)
BUN: 31 mg/dL — ABNORMAL HIGH (ref 8–27)
CO2: 23 mmol/L (ref 20–29)
Calcium: 9.7 mg/dL (ref 8.6–10.2)
Chloride: 103 mmol/L (ref 96–106)
Creatinine, Ser: 1.19 mg/dL (ref 0.76–1.27)
GFR calc Af Amer: 63 mL/min/{1.73_m2} (ref >59)
GFR calc non Af Amer: 54 mL/min/{1.73_m2} — ABNORMAL LOW (ref >59)
Glucose: 100 mg/dL — ABNORMAL HIGH (ref 65–99)
Potassium: 4.7 mmol/L (ref 3.5–5.2)
Sodium: 139 mmol/L (ref 134–144)

## 2018-11-25 LAB — TSH: TSH: 0.768 u[IU]/mL (ref 0.450–4.500)

## 2018-11-25 NOTE — Addendum Note (Signed)
Addended by: Diana Eves on: 11/25/2018 04:25 PM   Modules accepted: Orders

## 2018-12-01 ENCOUNTER — Other Ambulatory Visit: Payer: Self-pay | Admitting: Interventional Radiology

## 2018-12-01 DIAGNOSIS — C642 Malignant neoplasm of left kidney, except renal pelvis: Secondary | ICD-10-CM

## 2018-12-04 ENCOUNTER — Encounter: Payer: Self-pay | Admitting: *Deleted

## 2018-12-04 ENCOUNTER — Other Ambulatory Visit: Payer: Self-pay

## 2018-12-09 ENCOUNTER — Other Ambulatory Visit: Payer: Self-pay | Admitting: Cardiovascular Disease

## 2018-12-09 DIAGNOSIS — Z9889 Other specified postprocedural states: Secondary | ICD-10-CM | POA: Diagnosis not present

## 2018-12-09 DIAGNOSIS — H43813 Vitreous degeneration, bilateral: Secondary | ICD-10-CM | POA: Diagnosis not present

## 2018-12-09 DIAGNOSIS — H04123 Dry eye syndrome of bilateral lacrimal glands: Secondary | ICD-10-CM | POA: Diagnosis not present

## 2018-12-09 DIAGNOSIS — H353132 Nonexudative age-related macular degeneration, bilateral, intermediate dry stage: Secondary | ICD-10-CM | POA: Diagnosis not present

## 2018-12-09 DIAGNOSIS — Z961 Presence of intraocular lens: Secondary | ICD-10-CM | POA: Diagnosis not present

## 2018-12-09 DIAGNOSIS — H401113 Primary open-angle glaucoma, right eye, severe stage: Secondary | ICD-10-CM | POA: Diagnosis not present

## 2018-12-09 DIAGNOSIS — H401121 Primary open-angle glaucoma, left eye, mild stage: Secondary | ICD-10-CM | POA: Diagnosis not present

## 2018-12-12 ENCOUNTER — Other Ambulatory Visit: Payer: Self-pay | Admitting: Cardiovascular Disease

## 2018-12-23 ENCOUNTER — Other Ambulatory Visit: Payer: Self-pay

## 2018-12-23 ENCOUNTER — Ambulatory Visit (HOSPITAL_COMMUNITY)
Admission: RE | Admit: 2018-12-23 | Discharge: 2018-12-23 | Disposition: A | Discharge status: Home / Self Care | Payer: Medicare Other | Source: Ambulatory Visit | Attending: Interventional Radiology | Admitting: Interventional Radiology

## 2018-12-23 DIAGNOSIS — I700 Atherosclerosis of aorta: Secondary | ICD-10-CM | POA: Diagnosis not present

## 2018-12-23 DIAGNOSIS — C642 Malignant neoplasm of left kidney, except renal pelvis: Secondary | ICD-10-CM | POA: Insufficient documentation

## 2018-12-23 DIAGNOSIS — Z905 Acquired absence of kidney: Secondary | ICD-10-CM | POA: Diagnosis not present

## 2018-12-23 DIAGNOSIS — N2889 Other specified disorders of kidney and ureter: Secondary | ICD-10-CM | POA: Diagnosis not present

## 2018-12-23 DIAGNOSIS — K862 Cyst of pancreas: Secondary | ICD-10-CM | POA: Diagnosis not present

## 2018-12-23 MED ORDER — IOHEXOL 300 MG/ML  SOLN
100.0000 mL | Freq: Once | INTRAMUSCULAR | Status: AC | PRN
  Administered 2018-12-23: 100 mL via INTRAVENOUS

## 2018-12-24 ENCOUNTER — Other Ambulatory Visit: Payer: Self-pay

## 2018-12-24 ENCOUNTER — Ambulatory Visit
Admission: RE | Admit: 2018-12-24 | Discharge: 2018-12-24 | Disposition: A | Discharge status: Home / Self Care | Payer: Medicare Other | Source: Ambulatory Visit | Attending: Interventional Radiology | Admitting: Interventional Radiology

## 2018-12-24 DIAGNOSIS — C642 Malignant neoplasm of left kidney, except renal pelvis: Secondary | ICD-10-CM | POA: Diagnosis not present

## 2018-12-24 HISTORY — PX: IR RADIOLOGIST EVAL & MGMT: IMG5224

## 2018-12-24 NOTE — Progress Notes (Signed)
Chief Complaint: Status post cryoablation of a left clear cell renal carcinoma on 05/17/2016.  History of Present Illness: Dale [name] is a 85 y.o. male now 2.5 years status post cryoablation of a 1.8 cm interpolar left clear cell renal carcinoma. He has been doing fair, with periodic bilateral shoulder pain, L>R, for about 2 months. He denies any urinary symptoms.  Past Medical History:  Diagnosis Date   Allergic rhinitis    Anal fissure    Anginal pain (HCC)    Asthma    Cancer (HCC)    skin   Chest pain    CHF (congestive heart failure) ([place])    Coronary heart disease    s/p stenting. cath in 01/2012 noncritical occlusion   Dysrhythmia    1st degree heart block   GERD (gastroesophageal reflux disease)    Glaucoma    Hiatal hernia    Hyperlipidemia    Hypertension    Hypothyroidism    Idiopathic thrombocytopenic purpura ([place]) 2002   Macular degeneration    Nephrolithiasis    PUD (peptic ulcer disease)    remote   Sarcoidosis    pulmonary   Schatzki's ring     Past Surgical History:  Procedure Laterality Date   cardiac stents     COLONOSCOPY  10/30/2006   Normal rectum, sigmoid diverticula.Remainder of colonic mucosa appeared normal.   CORONARY ANGIOPLASTY WITH STENT PLACEMENT     about 10 years ago per pt (around 2007)   [place], URETEROSCOPY AND STENT PLACEMENT Left 06/16/2017   Procedure: CYSTOSCOPY WITH RETROGRADE PYELOGRAM, URETEROSCOPY,STONE EXTRACTION  AND STENT PLACEMENT;  Surgeon: Franchot Gallo, MD;  Location: [facility];  Service: Urology;  Laterality: Left;   ESOPHAGOGASTRODUODENOSCOPY  06/19/2004   Two esophageal rings and esophageal web as described above.  All of these were disrupted by passing 56-French Venia Minks dilator/ Candida esophagitis,which appears to be incidental given history of   antibiotic use, but nevertheless will be treated.   ESOPHAGOGASTRODUODENOSCOPY  10/30/2006   Distal  tandem esophageal ring status post dilation disruption as  described above.  Otherwise normal esophagus/  Small hiatal hernia otherwise normal stomach, D1 and D2   ESOPHAGOGASTRODUODENOSCOPY N/A 03/22/2015   Dr.Rourk- noncritical schatzki's ring and hiatal hernia-o/w normal EGD.    ESOPHAGOGASTRODUODENOSCOPY (EGD) WITH ESOPHAGEAL DILATION  03/04/2012   RMR- schatzki's ring, hiatal hernia, polypoid gastric mucosa, bx= minimally active gastritis.   HOLMIUM LASER APPLICATION Left [id number]   Procedure: HOLMIUM LASER APPLICATION;  Surgeon: Franchot Gallo, MD;  Location: [facility];  Service: Urology;  Laterality: Left;   IR GENERIC HISTORICAL  03/06/2016   IR RADIOLOGIST EVAL & MGMT 03/06/2016 Aletta Edouard, MD [facility] RAD   IR GENERIC HISTORICAL  06/18/2016   IR RADIOLOGIST EVAL & MGMT 06/18/2016 Aletta Edouard, MD [facility] RAD   IR RADIOLOGIST EVAL & MGMT  10/01/2016   IR RADIOLOGIST EVAL & MGMT  10/15/2017   LEFT HEART CATH N/A 02/02/2012   Procedure: LEFT HEART CATH;  Surgeon: Lorretta Harp, MD;  Location: [facility] CATH LAB;  Service: Cardiovascular;  Laterality: N/A;   MEDIASTINOSCOPY     for dx sarcoid   RADIOLOGY WITH ANESTHESIA Left 05/17/2016   Procedure: left renal ablation;  Surgeon: Aletta Edouard, MD;  Location: [facility];  Service: Radiology;  Laterality: Left;    Allergies: Azithromycin, Doxazosin, Acetaminophen, Atenolol, Hydrocodone, Hydrocodone-acetaminophen, Levofloxacin, Morphine, Penicillins, and Sulfonamide derivatives  Medications: Prior to Admission medications   Medication Sig Start Date End Date  Taking? Authorizing Provider  acetaZOLAMIDE (DIAMOX) 250 MG tablet Take 250 mg by mouth daily.     [provider]  aspirin 81 MG tablet Take 81 mg by mouth 3 (three) times a week.     [provider]  atorvastatin (LIPITOR) 40 MG tablet TAKE 1 TABLET BY MOUTH  EVERY EVENING 11/19/18   Troy Sine, MD  clopidogrel (PLAVIX) 75 MG tablet TAKE 1  TABLET BY MOUTH  DAILY 09/09/18   Troy Sine, MD  dorzolamide (TRUSOPT) 2 % ophthalmic solution Place 1 drop into both eyes 2 (two) times daily.     [provider]  furosemide (LASIX) 40 MG tablet TAKE 1 TABLET(40 MG) BY MOUTH DAILY- take 20 mg additional after 4:00 pm if continued swelling as needed. 09/09/18   Troy Sine, MD  guaiFENesin (MUCINEX) 600 MG 12 hr tablet Take 1,200 mg by mouth daily.     [provider]  isosorbide mononitrate (IMDUR) 60 MG 24 hr tablet TAKE 1 TABLET BY MOUTH IN  THE MORNING AND ONE-HALF  TABLET BY MOUTH AT NIGHT. 12/15/18   Troy Sine, MD  latanoprost (XALATAN) 0.005 % ophthalmic solution Place 1 drop into both eyes at bedtime.    [provider]  levothyroxine (SYNTHROID) 100 MCG tablet TAKE 1 TABLET BY MOUTH  DAILY 10/08/18   Troy Sine, MD  losartan (COZAAR) 100 MG tablet TAKE 1 TABLET BY MOUTH  DAILY 12/10/18   Troy Sine, MD  metoprolol tartrate (LOPRESSOR) 25 MG tablet Take 1 tablet (25 mg) in the morning, and 0.5 tablet (12.5 mg) in the evening. 11/24/18   Troy Sine, MD  mometasone (ELOCON) 0.1 % ointment Apply 1 application topically daily.     [provider]  Multiple Vitamin (MULTIVITAMIN) tablet Take 1 tablet by mouth every evening.     [provider]  Multiple Vitamins-Minerals (PRESERVISION/LUTEIN) CAPS Take 1 capsule by mouth 2 (two) times daily.     [provider]  nitroGLYCERIN (NITROSTAT) 0.4 MG SL tablet Place 1 tablet (0.4 mg total) under the tongue every 5 (five) minutes as needed. For chest pain 04/06/18   Leonie Man, MD  pantoprazole (PROTONIX) 40 MG tablet Take 1 tablet (40 mg total) by mouth 2 (two) times daily before a meal. 06/26/18   Mahala Menghini, PA-C  potassium chloride SA (K-DUR,KLOR-CON) 20 MEQ tablet Take 1 tablet (20 mEq total) by mouth daily. 06/02/18   Troy Sine, MD  timolol (TIMOPTIC) 0.5 % ophthalmic solution Place 1 drop into both eyes daily.      [provider]  triamcinolone (KENALOG) 0.1 % cream Apply 1 application topically 2 (two) times daily as needed (for irritation).     [provider]  vitamin C (ASCORBIC ACID) 500 MG tablet Take 500 mg by mouth daily.     [provider]  vitamin E 200 UNIT capsule Take 200 Units by mouth daily at 12 noon.     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    Employer: [employer] resource strain: Not on file   Food insecurity    Worry: Not on file    Inability: Not on file   Transportation needs    Medical: Not on file    Non-medical: Not on file  Tobacco Use   Smoking status: Former Smoker    Packs/day: 0.10    Years: 2.00    Pack years: 0.20    Types: Cigarettes, Cigars    Quit date: 05/06/1970    Years since quitting: 48.6   Smokeless tobacco: Never Used  Substance and Sexual Activity   Alcohol use: No    Alcohol/week: 0.0 standard drinks   Drug use: No   Sexual activity: Never  Lifestyle   Physical activity    Days per week: Not on file    Minutes per session: Not on file   Stress: Not on file  Relationships   Social connections    Talks on phone: Not on file    Gets together: Not on file    Attends religious service: Not on file    Active member of club or organization: Not on file    Attends meetings of clubs or organizations: Not on file    Relationship status: Not on file  Other  Topics Concern   Not on file  Social History Narrative   Not on file    ECOG Status: 0 - Asymptomatic  Review of Systems  Constitutional: Negative.   Respiratory: Negative.   Cardiovascular: Negative.   Gastrointestinal: Negative.   Genitourinary: Negative.   Musculoskeletal:       Bilateral shoulder pain.  Neurological: Negative.     Review of Systems: A 12 point ROS discussed and pertinent positives are indicated in the HPI above.  All other systems are negative.  Physical Exam No direct physical exam was performed (except for noted visual exam findings with Video Visits).   Vital Signs: There were no vitals taken for this visit.  Imaging: Ct Abdomen W Wo Contrast  Result Date: 12/23/2018 CLINICAL DATA:  Status post cryoablation of left kidney clear cell carcinoma. EXAM: CT ABDOMEN WITHOUT AND WITH CONTRAST TECHNIQUE: Multidetector CT imaging of the abdomen was performed following the standard protocol before and following the bolus administration of intravenous contrast. CONTRAST:  174mL OMNIPAQUE IOHEXOL 300 MG/ML  SOLN COMPARISON:  10/14/2017 FINDINGS: Lower chest: No acute abnormality. Hepatobiliary: Unchanged appearance of liver cyst. Largest in lateral segment of left lobe measuring 3.1 cm. No suspicious liver abnormality. Gallbladder normal. No biliary dilatation. Pancreas: No pancreatic inflammation or main duct dilatation. 8 mm hypodense lesion arising from the tail of pancreas is identified and is unchanged from previous exam, image 43/13. Spleen: Normal in size without focal abnormality. Adrenals/Urinary Tract: Normal right adrenal gland. Unchanged 1 cm left adrenal nodule, image 43/12. 7 mm low density structure within the medial aspect of the inferior pole of right kidney is unchanged, image 73/10. Small nonobstructing right renal calculi measures 4 mm, image 23/2. Ablation defect involving the anterolateral cortex of the left mid kidney is again noted, image 59/10.  This is stable when compared with the previous examination. No specific findings identified to suggest recurrent  tumor. Unchanged small cortical based hypodensities within the left kidney which are less than 1 cm and too small to reliably characterize. Stomach/Bowel: Stomach is within normal limits. No evidence of bowel wall thickening, distention, or inflammatory changes. Vascular/Lymphatic: Aortic atherosclerosis. No aneurysm. No abdominopelvic adenopathy identified. Other: No free fluid or fluid collections identified. Musculoskeletal: No acute or significant osseous findings. IMPRESSION: 1. Stable appearance of left kidney ablation defect. No findings to suggest local tumor recurrence or metastatic disease within the abdomen. 2. Unchanged appearance of small cystic lesion in tail of pancreas measuring 8 mm. Attention on follow-up imaging advised. 3. Unchanged liver cysts 4.  Aortic Atherosclerosis (ICD10-I70.0). Electronically Signed   By: Kerby Moors M.D.   On: 12/23/2018 10:06    Labs:  CBC: Recent Labs    06/12/18 1034  WBC 4.7  HGB 10.8*  HCT 31.3*  PLT 189    COAGS: No results for input(s): INR, APTT in the last 8760 hours.  BMP: Recent Labs    02/10/18 1415 03/03/18 1615 06/12/18 1034 10/06/18 1413 11/24/18 1444  NA 141 140 139  --  139  K 4.1 4.1 4.2  --  4.7  CL 107* 105 109*  --  103  CO2 20 21 26   --  23  GLUCOSE 105* 107* 100*  --  100*  BUN 21 23 28*  --  31*  CALCIUM 8.8 9.4 9.4  --  9.7  CREATININE 0.79 0.68* 0.91 0.90 1.19  GFRNONAA 81 86 76  --  54*  GFRAA 93 99 87  --  63    LIVER FUNCTION TESTS: Recent Labs    06/12/18 1034  BILITOT 0.3  AST 22  ALT 23  ALKPHOS 97  PROT 6.4  ALBUMIN 4.1    Assessment and Plan:  I spoke with Dale [name] by phone. Follow up CT on 12/22/2018 demonstrates a retracting area of post ablation scar tissue along the anterior interpolar left kidney with no evidence of recurrent enhancing tumor. I recommended another  follow up CT in one year, depending on his overall health. He is agreeable.   Electronically Signed: Azzie Roup 12/24/2018, 9:56 AM     I spent a total of 15 Minutes in remote  clinical consultation, greater than 50% of which was counseling/coordinating care post cryoablation of a left renal carcinoma.    Visit type: Audio only (telephone). Audio (no video) only due to patient's lack of internet/smartphone capability. Alternative for in-person consultation at [facility], [address] [place], [place], [place]. This visit type was conducted due to national recommendations for restrictions regarding the COVID-19 Pandemic (e.g. social distancing).  This format is felt to be most appropriate for this patient at this time.  All issues noted in this document were discussed and addressed.

## 2019-02-03 DIAGNOSIS — I10 Essential (primary) hypertension: Secondary | ICD-10-CM | POA: Diagnosis not present

## 2019-02-03 DIAGNOSIS — I2510 Atherosclerotic heart disease of native coronary artery without angina pectoris: Secondary | ICD-10-CM | POA: Diagnosis not present

## 2019-02-03 DIAGNOSIS — E063 Autoimmune thyroiditis: Secondary | ICD-10-CM | POA: Diagnosis not present

## 2019-02-09 ENCOUNTER — Ambulatory Visit (INDEPENDENT_AMBULATORY_CARE_PROVIDER_SITE_OTHER): Payer: Medicare Other | Admitting: Urology

## 2019-02-09 DIAGNOSIS — Z85528 Personal history of other malignant neoplasm of kidney: Secondary | ICD-10-CM | POA: Diagnosis not present

## 2019-02-22 ENCOUNTER — Other Ambulatory Visit: Payer: Self-pay | Admitting: Cardiovascular Disease

## 2019-02-22 MED ORDER — POTASSIUM CHLORIDE CRYS ER 20 MEQ PO TBCR: 20.0000 meq | EXTENDED_RELEASE_TABLET | Freq: Every day | ORAL | 2 refills | Status: DC

## 2019-02-23 MED ORDER — POTASSIUM CHLORIDE CRYS ER 20 MEQ PO TBCR: 20.0000 meq | EXTENDED_RELEASE_TABLET | Freq: Every day | ORAL | 2 refills | Status: DC

## 2019-02-23 NOTE — Addendum Note (Signed)
Addended by: Derl Barrow on: 02/23/2019 11:33 AM   Modules accepted: Orders

## 2019-03-02 ENCOUNTER — Ambulatory Visit: Payer: Medicare Other | Admitting: Cardiovascular Disease

## 2019-03-18 ENCOUNTER — Other Ambulatory Visit: Payer: Self-pay

## 2019-03-18 ENCOUNTER — Ambulatory Visit (INDEPENDENT_AMBULATORY_CARE_PROVIDER_SITE_OTHER): Payer: Medicare Other | Admitting: Cardiovascular Disease

## 2019-03-18 ENCOUNTER — Encounter: Payer: Self-pay | Admitting: Cardiovascular Disease

## 2019-03-18 DIAGNOSIS — E785 Hyperlipidemia, unspecified: Secondary | ICD-10-CM | POA: Diagnosis not present

## 2019-03-18 DIAGNOSIS — I493 Ventricular premature depolarization: Secondary | ICD-10-CM | POA: Diagnosis not present

## 2019-03-18 DIAGNOSIS — K219 Gastro-esophageal reflux disease without esophagitis: Secondary | ICD-10-CM | POA: Diagnosis not present

## 2019-03-18 DIAGNOSIS — I440 Atrioventricular block, first degree: Secondary | ICD-10-CM

## 2019-03-18 DIAGNOSIS — I2510 Atherosclerotic heart disease of native coronary artery without angina pectoris: Secondary | ICD-10-CM

## 2019-03-18 DIAGNOSIS — I10 Essential (primary) hypertension: Secondary | ICD-10-CM | POA: Diagnosis not present

## 2019-03-18 DIAGNOSIS — E039 Hypothyroidism, unspecified: Secondary | ICD-10-CM | POA: Diagnosis not present

## 2019-03-18 NOTE — Progress Notes (Signed)
Patient ID: Dale [name], male   DOB: 16-Nov-1928, 85 y.o.   MRN: [id number]    Primary M.D.: Dr. Redmond School  HPI: Dale [name] is a 85 y.o. male who presents to the office for a 4 month cardiology evaluation.  Dale [name] has known CAD and underwent initial intervention to his RCA in 2000. In 2009 a Cypher stent was placed beyond the acute margin of his RCA. His last cardiac catheterization was in September 2013 by Dr. [name] which showed 30% LAD narrowing after the second diagonal vessel, and normal left circumflex coronary artery, and patent RCA stents.  He has a history of hypothyroidism on Synthroid replacement and has a history of hyperlipidemia. He had been on on Crestor 5 mg plus Zetia 10 mg; however, due to recent increased cost of Zetia, this was discontinued and he has been on Crestor 20 mg daily.  He also has a history of GERD treated with Protonix, and he continues to be on dual antiplatelet therapy. He is responsive to Plavix on previous P2 Y12 testing.  When I saw him in 2016 he had experienced 3-4 episodes of chest pain over the six-month period.  Most of these episodes have occurred at night and were nitrate responsive.  He states that they were similar to his previous discomfort.  When I saw him, I further titrated his isosorbide mononitrate to 60 mg in the morning and 30 mg at night.   He saw [name], [place] in August 2018 with weight loss, weakness, and vague chest pain.  He felt his chest pain was atypical and not ischemic.  An echo Doppler study on 12/23/2016 showed an EF of 55-60%.  He was hospitalized on August 29 through January 03, 2017 at  [facility].  Apparently he had a nuclear stress test which did not reveal any reversible ischemia.  He was also felt to have acute sinusitis for which he was treated with antibiotics.  He has a history of renal cell CA.Marland Kitchen     When I saw him in October 2018, his chest pain had resolved. He underwent a carotid duplex study in October 2018  which showed mild plaque bilaterally at 1-39%. He denies increasing episodes of chest pain.  At times he notes rare chest discomfort which is short-lived.  He had developed some gross hematuria and was found to have a kidney stone which he underwent cystoscopy, left ureteral pyelogram with fluoroscopic interpretation, left ureteroscopy, holmium laser lithotripsy and extraction of left mid ureteral stone, placement of 24 x 6 French contour double-J stent, without tether by Dr [name] on 06/16/2017.  I saw him in March 2019.  Since that time, he has been seen by [name], PAC on 2 occasions in October.  He was seen initially on February 03, 2018 for bilateral ankle edema at which time his hydrochlorothiazide was switched to Lasix.  When seen in follow-up he continued to have ankle edema and Lasix was further titrated to 40 mg and potassium supplementation was increased.  I saw him in January 2020.  Several weeks prior to that evaluation he had experienced an episode of sharp chest ache which was nonexertional.    He denied recurrent anginal type symptoms.  He was sleepy during the day but admits that he is sleeping well at night.  He typically goes to bed at 11 PM and wakes up at 7 AM.    When I saw him in May 2020, he had noticed progressive lower extremity  leg edema for several weeks previously.  He apparently was not taking his Lasix in the morning but had been taking this at 5 PM.  He denied chest pain, awareness of abnormal heart rhythm, PND orthopnea.  During that evaluation, I suggested that he take his Lasix 40 mg in the morning and for the next several days take 20 mg in the later afternoon and if edema persisted to continue to take the Lasix perhaps every other day in the afternoon as well.  He states typically he does not take his Lasix in the morning but typically takes his 40 mg at lunchtime.  He then waits till suppertime around 7 PM to take an additional 40 mg of Lasix which he has been doing on  most days.  He urinates until 10 PM.  He has not been taking the Lasix as soon as he wakes up because then he states he cannot do anything without only he continues to experience ankle swelling bilaterally left greater than right.  He has not been using support stockings.  At times he may notice a mild arrhythmia.  He denied presyncope or syncope or recurrent anginal symptoms.  During that evaluation, I recommended that he continue to use support stockings.  Due to his frequent PVCs noted on ECG I slightly titrated his metoprolol from 12.5 mg twice a day to 25 mg in the morning and 12.5 mg at night.    Since I last saw him on November 24, 2018, he has felt improved with his titration of beta-blocker therapy and has noticed resolution of his previous sense of palpitations.  His peripheral edema has improved with reinstitution of support stockings.  He denies any recurrent anginal symptoms.  He denies any significant shortness of breath.  He presents for reevaluation.  Past Medical History:  Diagnosis Date  . Allergic rhinitis   . Anal fissure   . Anginal pain ([place])   . Asthma   . Cancer ([place])    skin  . Chest pain   . CHF (congestive heart failure) ([place])   . Coronary heart disease    s/p stenting. cath in 01/2012 noncritical occlusion  . Dysrhythmia    1st degree heart block  . GERD (gastroesophageal reflux disease)   . Glaucoma   . Hiatal hernia   . Hyperlipidemia   . Hypertension   . Hypothyroidism   . Idiopathic thrombocytopenic purpura ([place]) 2002  . Macular degeneration   . Nephrolithiasis   . PUD (peptic ulcer disease)    remote  . Sarcoidosis    pulmonary  . Schatzki's ring     Past Surgical History:  Procedure Laterality Date  . cardiac stents    . COLONOSCOPY  10/30/2006   Normal rectum, sigmoid diverticula.Remainder of colonic mucosa appeared normal.  . CORONARY ANGIOPLASTY WITH STENT PLACEMENT     about 10 years ago per pt (around 2007)  . CYSTOSCOPY WITH RETROGRADE  PYELOGRAM, URETEROSCOPY AND STENT PLACEMENT Left 06/16/2017   Procedure: CYSTOSCOPY WITH RETROGRADE PYELOGRAM, URETEROSCOPY,STONE EXTRACTION  AND STENT PLACEMENT;  Surgeon: Franchot Gallo, MD;  Location: [facility];  Service: Urology;  Laterality: Left;  . ESOPHAGOGASTRODUODENOSCOPY  06/19/2004   Two esophageal rings and esophageal web as described above.  All of these were disrupted by passing 56-French Venia Minks dilator/ Candida esophagitis,which appears to be incidental given history of   antibiotic use, but nevertheless will be treated.  . ESOPHAGOGASTRODUODENOSCOPY  10/30/2006   Distal tandem esophageal ring status post dilation disruption as  described above.  Otherwise normal esophagus/  Small hiatal hernia otherwise normal stomach, D1 and D2  . ESOPHAGOGASTRODUODENOSCOPY N/A 03/22/2015   Dr.Rourk- noncritical schatzki's ring and hiatal hernia-o/w normal EGD.   Marland Kitchen ESOPHAGOGASTRODUODENOSCOPY (EGD) WITH ESOPHAGEAL DILATION  03/04/2012   RMR- schatzki's ring, hiatal hernia, polypoid gastric mucosa, bx= minimally active gastritis.  Marland Kitchen HOLMIUM LASER APPLICATION Left 5/59/7416   Procedure: HOLMIUM LASER APPLICATION;  Surgeon: Franchot Gallo, MD;  Location: [facility];  Service: Urology;  Laterality: Left;  . IR GENERIC HISTORICAL  03/06/2016   IR RADIOLOGIST EVAL & MGMT 03/06/2016 Aletta Edouard, MD [facility] INTERV RAD  . IR GENERIC HISTORICAL  06/18/2016   IR RADIOLOGIST EVAL & MGMT 06/18/2016 Aletta Edouard, MD [facility] INTERV RAD  . IR RADIOLOGIST EVAL & MGMT  10/01/2016  . IR RADIOLOGIST EVAL & MGMT  10/15/2017  . IR RADIOLOGIST EVAL & MGMT  12/24/2018  . LEFT HEART CATH N/A 02/02/2012   Procedure: LEFT HEART CATH;  Surgeon: Lorretta Harp, MD;  Location: [facility] CATH LAB;  Service: Cardiovascular;  Laterality: N/A;  . MEDIASTINOSCOPY     for dx sarcoid  . RADIOLOGY WITH ANESTHESIA Left 05/17/2016   Procedure: left renal ablation;  Surgeon: Aletta Edouard, MD;  Location: [facility];  Service: Radiology;   Laterality: Left;    Allergies  Allergen Reactions  . Azithromycin Other (See Comments)    Sore mouth and fever blisters around mouth, sores in nose area as well  . Doxazosin Shortness Of Breath  . Acetaminophen Other (See Comments)    REACTION: UNKNOWN REACTION  . Atenolol Other (See Comments)    REACTION: UNKNOWN REACTION  . Hydrocodone Nausea And Vomiting  . Hydrocodone-Acetaminophen Nausea Only  . Levofloxacin Other (See Comments)    Caused stomach problems.  . Morphine Other (See Comments)    "made me crazy"  . Penicillins Nausea And Vomiting and Other (See Comments)    Has patient had a PCN reaction causing immediate rash, facial/tongue/throat swelling, SOB or lightheadedness with hypotension: No Has patient had a PCN reaction causing severe rash involving mucus membranes or skin necrosis: No Has patient had a PCN reaction that required hospitalization No Has patient had a PCN reaction occurring within the last 10 years: No If all of the above answers are "NO", then may proceed with Cephalosporin use.   . Sulfonamide Derivatives Nausea And Vomiting    Current Outpatient Medications  Medication Sig Dispense Refill  . aspirin 81 MG tablet Take 81 mg by mouth 3 (three) times a week.     Marland Kitchen atorvastatin (LIPITOR) 40 MG tablet TAKE 1 TABLET BY MOUTH  EVERY EVENING 90 tablet 1  . clopidogrel (PLAVIX) 75 MG tablet TAKE 1 TABLET BY MOUTH  DAILY 90 tablet 3  . dorzolamide (TRUSOPT) 2 % ophthalmic solution Place 1 drop into both eyes 2 (two) times daily.     . furosemide (LASIX) 40 MG tablet TAKE 1 TABLET(40 MG) BY MOUTH DAILY- take 20 mg additional after 4:00 pm if continued swelling as needed. 90 tablet 3  . guaiFENesin (MUCINEX) 600 MG 12 hr tablet Take 1,200 mg by mouth daily.     . isosorbide mononitrate (IMDUR) 60 MG 24 hr tablet TAKE 1 TABLET BY MOUTH IN  THE MORNING AND ONE-HALF  TABLET BY MOUTH AT NIGHT. 135 tablet 1  . latanoprost (XALATAN) 0.005 % ophthalmic solution Place  1 drop into both eyes at bedtime.    Marland Kitchen levothyroxine (SYNTHROID) 100 MCG tablet TAKE 1 TABLET BY  MOUTH  DAILY 90 tablet 3  . losartan (COZAAR) 100 MG tablet TAKE 1 TABLET BY MOUTH  DAILY 90 tablet 3  . metoprolol tartrate (LOPRESSOR) 25 MG tablet Take 1 tablet (25 mg) in the morning, and 0.5 tablet (12.5 mg) in the evening. 90 tablet 3  . mometasone (ELOCON) 0.1 % ointment Apply 1 application topically daily.     . Multiple Vitamin (MULTIVITAMIN) tablet Take 1 tablet by mouth every evening.     . Multiple Vitamins-Minerals (PRESERVISION/LUTEIN) CAPS Take 1 capsule by mouth 2 (two) times daily.     . nitroGLYCERIN (NITROSTAT) 0.4 MG SL tablet Place 1 tablet (0.4 mg total) under the tongue every 5 (five) minutes as needed. For chest pain 25 tablet 3  . pantoprazole (PROTONIX) 40 MG tablet Take 1 tablet (40 mg total) by mouth 2 (two) times daily before a meal. 180 tablet 3  . potassium chloride SA (KLOR-CON) 20 MEQ tablet Take 1 tablet (20 mEq total) by mouth daily. 90 tablet 2  . timolol (TIMOPTIC) 0.5 % ophthalmic solution Place 1 drop into both eyes daily.     Marland Kitchen triamcinolone (KENALOG) 0.1 % cream Apply 1 application topically 2 (two) times daily as needed (for irritation).     . vitamin C (ASCORBIC ACID) 500 MG tablet Take 500 mg by mouth daily.     . vitamin E 200 UNIT capsule Take 200 Units by mouth daily at 12 noon.      No current facility-administered medications for this visit.     Social History   Socioeconomic History  . Marital status: Married    Spouse name: Not on file  . Number of children: 1  . Years of education: Not on file  . Highest education level: Not on file  Occupational History  . 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 Financial resource strain: Not on file  . Food insecurity    Worry: Not on file    Inability: Not on file  . Transportation needs    Medical: Not on file    Non-medical: Not on file   Tobacco Use  . Smoking status: Former Smoker    Packs/day: 0.10    Years: 2.00    Pack years: 0.20    Types: Cigarettes, Cigars    Quit date: 05/06/1970    Years since quitting: 48.8  . Smokeless tobacco: Never Used  Substance and Sexual Activity  . Alcohol use: No    Alcohol/week: 0.0 standard drinks  . Drug use: No  . Sexual activity: Never  Lifestyle  . Physical activity    Days per week: Not on file    Minutes per session: Not on file  . Stress: Not on file  Relationships  . Social Herbalist on phone: Not on file    Gets together: Not on file    Attends religious service: Not on file    Active member of club or organization: Not on file    Attends meetings of clubs or organizations: Not on file    Relationship status: Not on file  . Intimate partner violence    Fear of current or ex partner: Not on file    Emotionally abused: Not on file    Physically abused: Not on file    Forced sexual activity: Not on file  Other Topics Concern  . Not on file  Social History Narrative  .  Not on file   Additional social history is notable that he is married and lives with his wife. He quit smoking over 40 years ago. Has one child. He remains active.  Family History  Problem Relation Age of Onset  . Heart disease Father        deceased age 93  . Stroke Mother   . Alzheimer's disease Mother   . Heart attack Brother        deceased age 54  . Cancer Other        niece  . Colon cancer Neg Hx    ROS General: Negative; No fevers, chills, or night sweats; Positive for weight loss HEENT: Positive for recent sinusitis.  No changes in vision., difficulty swallowing Pulmonary: Negative; No cough, wheezing, shortness of breath, hemoptysis Cardiovascular: See history of present illness GI: Negative; No nausea, vomiting, diarrhea, or abdominal pain GU: History of renal cell CA status post cryoablation; status post renal stone removal Musculoskeletal: Negative; no myalgias,  joint pain, or weakness Hematologic/Oncology: Negative; no easy bruising, bleeding Endocrine: Positive for hypothyroidism; no diabetes Neuro: Negative; no changes in balance, headaches Skin: Negative; No rashes or skin lesions Psychiatric: Negative; No behavioral problems, depression Sleep: Negative; No snoring, daytime sleepiness, hypersomnolence, bruxism, restless legs, hypnogognic hallucinations, no cataplexy Other comprehensive 14 point system review is negative.   PE BP 134/64 (BP Location: Left Arm, Patient Position: Sitting, Cuff Size: Normal)   Pulse 62   Temp (!) 97.5 F (36.4 C)   Ht '5\' 8"'  (1.727 m)   Wt 136 lb (61.7 kg)   BMI 20.68 kg/m    Repeat blood pressure by me was 132/66  Wt Readings from Last 3 Encounters:  03/18/19 136 lb (61.7 kg)  11/24/18 139 lb 3.2 oz (63.1 kg)  09/09/18 132 lb (59.9 kg)   General: Alert, oriented, no distress.  Skin: normal turgor, no rashes, warm and dry HEENT: Normocephalic, atraumatic. Pupils equal round and reactive to light; sclera anicteric; extraocular muscles intact;  Nose without nasal septal hypertrophy Mouth/Parynx benign; Mallinpatti scale 3 Neck: No JVD, no carotid bruits; normal carotid upstroke Lungs: clear to ausculatation and percussion; no wheezing or rales Chest wall: without tenderness to palpitation Heart: PMI not displaced, RRR without ectopy on prolonged auscultation, s1 s2 normal, 1/6 systolic murmur, no diastolic murmur, no rubs, gallops, thrills, or heaves Abdomen: soft, nontender; no hepatosplenomehaly, BS+; abdominal aorta nontender and not dilated by palpation. Back: no CVA tenderness Pulses 2+ Musculoskeletal: full range of motion, normal strength, no joint deformities Extremities: He was wearing support stockings with resolution of edema in the left lower extremity.  There was trivial residual edema at the right ankle, no clubbing cyanosis, Homan's sign negative  Neurologic: grossly nonfocal; Cranial  nerves grossly wnl Psychologic: Normal mood and affect   ECG (independently read by me): Normal sinus rhythm at 62 bpm.  First-degree AV block with a parable 244 ms.  1 isolated PVC.  No ST segment change  July 2020 ECG (independently read by me):Sinus rhythm at 68 bpm with first-degree AV block ( PR interval 236 msec) and frequent unifocal PVCs  Sep 09, 2018 ECG (independently read by me): Sinus bradycardia with first-degree AV block with ventricular rate of 59 bpm.  PR interval 244 ms.  June 02, 2018 ECG (independently read by me): Sinus rhythm at 62 bpm with first-degree block with apparent of what to 40 ms.  No significant ST-T changes.  July 24, 2017 ECG (independently read by me): normal  sinus rhythm at 60 bpm with first-degree AV block.  PR interval 236 ms.  QTc interval normal.  No ectopy.  October 2018 ECG (independently read by me): Sinus bradycardia 50 bpm.  First degree AV block with a PR interval at 224 ms.  No syncope ST changes.  September 2017 ECG (independently read by me): Sinus bradycardia 59 bpm.  First-degree AV block with PR interval 240 ms.  No significant ST-T changes.  November 2016 ECG (independently read by me): Sinus bradycardia at 55 beats per minute with first-degree AV block.  PR interval 246 ms.  No significant ST-T changes.  May 2016 ECG (independently read by me): Sinus rhythm with first-degree heart block.  PR interval 226.  No ST segment changes.  February 2016 ECG (independently read by me): Sinus rhythm at 65 bpm.  Mild first-degree AV block with a PR interval 228 ms.  No significant ST segment changes.  December 2015 ECG (independently read by me): Sinus bradycardia 57 bpm.  Borderline first-degree AV block with a PR interval 216 daily.  Seconds.  QTc interval is normal at 373 ms.  June 2015 ECG (independently read by me): Sinus bradycardia 54 beats per minute.  First degree block with a PR interval of 232 ms.  No significant ST-T changes.  Prior  ECG: Sinus bradycardia 54 beats per minute. PR interval 206 ms. QTc interval normal  LABS: BMP Latest Ref Rng & Units 11/24/2018 10/06/2018 06/12/2018  Glucose 65 - 99 mg/dL 100(H) - 100(H)  BUN 8 - 27 mg/dL 31(H) - 28(H)  Creatinine 0.76 - 1.27 mg/dL 1.19 0.90 0.91  BUN/Creat Ratio 10 - 24 26(H) - 31(H)  Sodium 134 - 144 mmol/L 139 - 139  Potassium 3.5 - 5.2 mmol/L 4.7 - 4.2  Chloride 96 - 106 mmol/L 103 - 109(H)  CO2 20 - 29 mmol/L 23 - 26  Calcium 8.6 - 10.2 mg/dL 9.7 - 9.4   Hepatic Function Latest Ref Rng & Units 06/12/2018 09/08/2015 07/07/2014  Total Protein 6.0 - 8.5 g/dL 6.4 6.3 6.5  Albumin 3.6 - 4.6 g/dL 4.1 3.9 4.2  AST 0 - 40 IU/L '22 24 22  ' ALT 0 - 44 IU/L '23 20 19  ' Alk Phosphatase 39 - 117 IU/L 97 76 73  Total Bilirubin 0.0 - 1.2 mg/dL 0.3 0.7 0.4  Bilirubin, Direct - - - -   CBC Latest Ref Rng & Units 06/12/2018 05/27/2017 04/07/2017  WBC 3.4 - 10.8 x10E3/uL 4.7 5.3 5.5  Hemoglobin 13.0 - 17.7 g/dL 10.8(L) 11.0(L) 11.4(L)  Hematocrit 37.5 - 51.0 % 31.3(L) 32.4(L) 35.2(L)  Platelets 150 - 450 x10E3/uL 189 198 195   Lab Results  Component Value Date   MCV 98 (H) 06/12/2018   MCV 101.9 (H) 05/27/2017   MCV 106.7 (H) 04/07/2017   Lab Results  Component Value Date   TSH 0.768 11/24/2018  No results found for: HGBA1C   Lipid Panel     Component Value Date/Time   CHOL 111 06/12/2018 1034   TRIG 66 06/12/2018 1034   HDL 50 06/12/2018 1034   CHOLHDL 2.2 06/12/2018 1034   CHOLHDL 2.6 01/02/2017 0612   VLDL 10 01/02/2017 0612   LDLCALC 48 06/12/2018 1034   RADIOLOGY: No results found.  IMPRESSION:  1. Coronary artery disease involving native coronary artery of native heart without angina pectoris   2. Essential hypertension   3. PVC's (premature ventricular contractions)   4. Hyperlipidemia with target LDL less than 70   5. Gastroesophageal  reflux disease without esophagitis   6. First degree AV block   7. Hypothyroidism, unspecified type     ASSESSMENT AND PLAN:  Dale [name] is an 85 years old gentleman who has known CAD and underwent initial intervention to his RCA in 2000 and subsequent intervention in 2009. At last catheterization in 2013 his RCA stents were patent and he had mild 30% LAD narrowing.  He has had  leg edema which has necessitated changing his diuretic regimen to furosemide in place of hydrochlorothiazide.  This initially improved his edema but on prior evaluation due to progressive lower swelling his furosemide dose was increased to 40 mg in the morning and 20 mg at approximately 4 PM if continued swelling was present.  He has now been on support stockings with significant improvement with now complete resolution of edema in the left lower extremity and only trivial residual at right ankle swelling.  With reference to his CAD, he is not having any anginal symptomatology.  His blood pressure today is well controlled on his regimen consisting of losartan 100 mg daily, metoprolol tartrate 25 mg in the morning and 12.5 mg in the evening, isosorbide 60 mg in the morning and 30 mg in the evening in addition to his furosemide.  At his last evaluation he was noted to have frequent PVCs.  These have improved on his increased beta-blocker regimen.  He has a history of thyroidism and continues to be on levothyroxine at 100 mcg.  Most recent TSH level was 0.768 on November 24, 2018.  He continues to be on atorvastatin 40 mg hyperlipidemia.  Most recent lipid studies remained excellent with an LDL cholesterol at 48 and total cholesterol at 111.  Triglycerides were 66.  She needs to be on pantoprazole for GERD.  He has continued to be on dual antiplatelet therapy and has been without bleeding.  Clinically he feels significantly improved.  I will see him in 6 months for reevaluation or sooner if problems arise.  Time spent: 25 minutes Troy Sine, MD, [facility]  03/18/2019 6:16 PM

## 2019-03-18 NOTE — Patient Instructions (Signed)
Medication Instructions:  Your physician recommends that you continue on your current medications as directed. Please refer to the Current Medication list given to you today.  *If you need a refill on your cardiac medications before your next appointment, please call your pharmacy*  Lab Work: NONE If you have labs (blood work) drawn today and your tests are completely normal, you will receive your results only by: Marland Kitchen MyChart Message (if you have MyChart) OR . A paper copy in the mail If you have any lab test that is abnormal or we need to change your treatment, we will call you to review the results.  Testing/Procedures: NONE  Follow-Up: At [name], you and your health needs are our priority.  As part of our continuing mission to provide you with exceptional heart care, we have created designated Provider Care Teams.  These Care Teams include your primary Cardiologist (physician) and Advanced Practice Providers (APPs -  Physician Assistants and Nurse Practitioners) who all work together to provide you with the care you need, when you need it.  Your next appointment:   6 months  The format for your next appointment:   In Person  Provider:   You may see Shelva Majestic, MD or one of the following Advanced Practice Providers on your designated Care Team:    [name], PA-C  Fabian Sharp, PA-C or   Roby Lofts, [place]

## 2019-04-06 DIAGNOSIS — Z23 Encounter for immunization: Secondary | ICD-10-CM | POA: Diagnosis not present

## 2019-04-13 DIAGNOSIS — H43813 Vitreous degeneration, bilateral: Secondary | ICD-10-CM | POA: Diagnosis not present

## 2019-04-13 DIAGNOSIS — H401121 Primary open-angle glaucoma, left eye, mild stage: Secondary | ICD-10-CM | POA: Diagnosis not present

## 2019-04-13 DIAGNOSIS — H04123 Dry eye syndrome of bilateral lacrimal glands: Secondary | ICD-10-CM | POA: Diagnosis not present

## 2019-04-13 DIAGNOSIS — Z961 Presence of intraocular lens: Secondary | ICD-10-CM | POA: Diagnosis not present

## 2019-04-13 DIAGNOSIS — H401113 Primary open-angle glaucoma, right eye, severe stage: Secondary | ICD-10-CM | POA: Diagnosis not present

## 2019-04-13 DIAGNOSIS — H353132 Nonexudative age-related macular degeneration, bilateral, intermediate dry stage: Secondary | ICD-10-CM | POA: Diagnosis not present

## 2019-04-13 DIAGNOSIS — Z9889 Other specified postprocedural states: Secondary | ICD-10-CM | POA: Diagnosis not present

## 2019-04-30 ENCOUNTER — Other Ambulatory Visit: Payer: Self-pay | Admitting: Cardiovascular Disease

## 2019-05-06 DIAGNOSIS — E7849 Other hyperlipidemia: Secondary | ICD-10-CM | POA: Diagnosis not present

## 2019-05-06 DIAGNOSIS — I2510 Atherosclerotic heart disease of native coronary artery without angina pectoris: Secondary | ICD-10-CM | POA: Diagnosis not present

## 2019-05-06 DIAGNOSIS — I10 Essential (primary) hypertension: Secondary | ICD-10-CM | POA: Diagnosis not present

## 2019-05-06 DIAGNOSIS — E063 Autoimmune thyroiditis: Secondary | ICD-10-CM | POA: Diagnosis not present

## 2019-05-20 DIAGNOSIS — Z23 Encounter for immunization: Secondary | ICD-10-CM | POA: Diagnosis not present

## 2019-05-31 ENCOUNTER — Other Ambulatory Visit: Payer: Self-pay | Admitting: Cardiovascular Disease

## 2019-05-31 NOTE — Telephone Encounter (Signed)
*  STAT* If patient is at the pharmacy, call can be transferred to refill team.   1. Which medications need to be refilled? (please list name of each medication and dose if known) potassium chloride SA (KLOR-CON) 20 MEQ tablet  2. Which pharmacy/location (including street and city if local pharmacy) is medication to be sent to? [place], [place]  3. Do they need a 30 day or 90 day supply? 90 day

## 2019-06-02 ENCOUNTER — Other Ambulatory Visit: Payer: Self-pay | Admitting: Interventional Radiology

## 2019-06-06 DIAGNOSIS — E7849 Other hyperlipidemia: Secondary | ICD-10-CM | POA: Diagnosis not present

## 2019-06-06 DIAGNOSIS — I10 Essential (primary) hypertension: Secondary | ICD-10-CM | POA: Diagnosis not present

## 2019-06-06 DIAGNOSIS — E063 Autoimmune thyroiditis: Secondary | ICD-10-CM | POA: Diagnosis not present

## 2019-06-06 DIAGNOSIS — I2510 Atherosclerotic heart disease of native coronary artery without angina pectoris: Secondary | ICD-10-CM | POA: Diagnosis not present

## 2019-06-08 ENCOUNTER — Telehealth: Payer: Self-pay | Admitting: Cardiovascular Disease

## 2019-06-08 ENCOUNTER — Telehealth: Payer: Self-pay | Admitting: Internal Medicine

## 2019-06-08 ENCOUNTER — Other Ambulatory Visit: Payer: Self-pay | Admitting: Gastroenterology

## 2019-06-08 MED ORDER — PANTOPRAZOLE SODIUM 40 MG PO TBEC: 40.0000 mg | DELAYED_RELEASE_TABLET | Freq: Every day | ORAL | 1 refills | Status: DC

## 2019-06-08 MED ORDER — POTASSIUM CHLORIDE CRYS ER 20 MEQ PO TBCR: 20.0000 meq | EXTENDED_RELEASE_TABLET | Freq: Every day | ORAL | 3 refills | Status: DC

## 2019-06-08 NOTE — Telephone Encounter (Signed)
Spoke with wife and informed a new prescription for potassium has been sent to the requested pharmacy. Wife advised to contact pcp for Protonix refill.

## 2019-06-08 NOTE — Telephone Encounter (Signed)
Pt's wife called to say patient needed his pantoprazole 40mg  sent to [facility],

## 2019-06-08 NOTE — Telephone Encounter (Signed)
Noted. Tried calling pt several times. Number is currently busy. Will call back pt.

## 2019-06-08 NOTE — Telephone Encounter (Signed)
Pt needs RX pantoprazole 40mg  bid,  #90 day supply, sent to [facility]

## 2019-06-08 NOTE — Telephone Encounter (Signed)
Per Dr. Gala Romney last note in 2019, Protonix was to be taken once a day. Additionally instructions from [place] in 2020 was for patient to take Protonix once a day. Please let them know, I am sending in Protonix 40 mg daily which he should take 30 minutes before breakfast.

## 2019-06-08 NOTE — Telephone Encounter (Signed)
New message   Pt c/o medication issue:  1. Name of Medication: potassium chloride SA (KLOR-CON) 20 MEQ tablet  pantoprazole (PROTONIX) 40 MG tablet  2. How are you currently taking this medication (dosage and times per day)? As written  3. Are you having a reaction (difficulty breathing--STAT)?n/a  4. What is your medication issue?Patient's wife states that there are no refills on this medication. The patient is out of potassium.. Please send prescription to [place], [place] a 90 day supply.

## 2019-06-10 NOTE — Telephone Encounter (Signed)
Lmom, waiting on a return call.  

## 2019-06-10 NOTE — Telephone Encounter (Signed)
Spoke with pt. He is taking Pantoprazole once daily as directed and continue.

## 2019-06-15 ENCOUNTER — Other Ambulatory Visit: Payer: Self-pay | Admitting: *Deleted

## 2019-06-15 MED ORDER — POTASSIUM CHLORIDE CRYS ER 20 MEQ PO TBCR: 20.0000 meq | EXTENDED_RELEASE_TABLET | Freq: Every day | ORAL | 3 refills | Status: DC

## 2019-06-15 NOTE — Telephone Encounter (Signed)
Rx has been sent to the pharmacy electronically. ° °

## 2019-06-18 DIAGNOSIS — Z23 Encounter for immunization: Secondary | ICD-10-CM | POA: Diagnosis not present

## 2019-06-22 ENCOUNTER — Telehealth: Payer: Self-pay | Admitting: Cardiovascular Disease

## 2019-06-22 NOTE — Telephone Encounter (Signed)
Dale [name] with [facility] called to clarify Klor-Con dosage. Dosage confirmed, understanding verbalized.

## 2019-06-27 ENCOUNTER — Other Ambulatory Visit: Payer: Self-pay | Admitting: Cardiovascular Disease

## 2019-06-28 ENCOUNTER — Other Ambulatory Visit: Payer: Self-pay | Admitting: Cardiovascular Disease

## 2019-06-28 NOTE — Telephone Encounter (Signed)
*  STAT* If patient is at the pharmacy, call can be transferred to refill team.   1. Which medications need to be refilled? (please list name of each medication and dose if known) clopidogrel 75 mg, potassium 2 MEQ, atorvastatin 40 mg  2. Which pharmacy/location (including street and city if local pharmacy) is medication to be sent to? [facility] mail [facility]  3. Do they need a 30 day or 90 day supply? [place]

## 2019-06-30 MED ORDER — ATORVASTATIN CALCIUM 40 MG PO TABS: ORAL_TABLET | ORAL | 3 refills | Status: DC

## 2019-06-30 MED ORDER — CLOPIDOGREL BISULFATE 75 MG PO TABS: 75.0000 mg | ORAL_TABLET | Freq: Every day | ORAL | 3 refills | Status: DC

## 2019-06-30 NOTE — Telephone Encounter (Signed)
Atorvastatin & Clopidogrel refilled

## 2019-06-30 NOTE — Telephone Encounter (Signed)
potassium chloride SA (KLOR-CON) 20 MEQ tablet 90 tablet 3 06/15/2019    Sig - Route: Take 1 tablet (20 mEq total) by mouth daily. - Oral   Sent to pharmacy as: potassium chloride SA (KLOR-CON) 20 MEQ tablet   E-Prescribing Status: Receipt confirmed by pharmacy (06/15/2019 4:48 PM EST)   Pharmacy  [facility] - [place], [place]

## 2019-07-04 DIAGNOSIS — I2510 Atherosclerotic heart disease of native coronary artery without angina pectoris: Secondary | ICD-10-CM | POA: Diagnosis not present

## 2019-07-04 DIAGNOSIS — I10 Essential (primary) hypertension: Secondary | ICD-10-CM | POA: Diagnosis not present

## 2019-07-04 DIAGNOSIS — E063 Autoimmune thyroiditis: Secondary | ICD-10-CM | POA: Diagnosis not present

## 2019-07-04 DIAGNOSIS — E7849 Other hyperlipidemia: Secondary | ICD-10-CM | POA: Diagnosis not present

## 2019-07-14 DIAGNOSIS — H0102A Squamous blepharitis right eye, upper and lower eyelids: Secondary | ICD-10-CM | POA: Diagnosis not present

## 2019-07-14 DIAGNOSIS — H0102B Squamous blepharitis left eye, upper and lower eyelids: Secondary | ICD-10-CM | POA: Diagnosis not present

## 2019-07-29 ENCOUNTER — Other Ambulatory Visit: Payer: Self-pay | Admitting: Cardiovascular Disease

## 2019-07-29 MED ORDER — METOPROLOL TARTRATE 25 MG PO TABS: ORAL_TABLET | ORAL | 3 refills | Status: DC

## 2019-07-29 MED ORDER — METOPROLOL TARTRATE 25 MG PO TABS: ORAL_TABLET | ORAL | 0 refills | Status: DC

## 2019-07-29 NOTE — Telephone Encounter (Signed)
30 day supply of metoprolol sent to [facility] and 90 day supply sent to [facility].

## 2019-07-29 NOTE — Telephone Encounter (Signed)
  *  STAT* If patient is at the pharmacy, call can be transferred to refill team.   1. Which medications need to be refilled? (please list name of each medication and dose if known)   metoprolol tartrate (LOPRESSOR) 25 MG tablet   2. Which pharmacy/location (including street and city if local pharmacy) is medication to be sent to? [place], [place]  3. Do they need a 30 day or 90 day supply? 90 days  Pt is completely out of the medication, please send 30 day supply at [facility] [place], [place]

## 2019-08-03 ENCOUNTER — Other Ambulatory Visit: Payer: Self-pay | Admitting: Cardiovascular Disease

## 2019-08-03 NOTE — Telephone Encounter (Signed)
*  STAT* If patient is at the pharmacy, call can be transferred to refill team.   1. Which medications need to be refilled? (please list name of each medication and dose if known)  isosorbide mononitrate (IMDUR) 60 MG 24 hr tablet  2. Which pharmacy/location (including street and city if local pharmacy) is medication to be sent to? [place], [place]  3. Do they need a 30 day or 90 day supply? 90 day supply

## 2019-08-04 MED ORDER — ISOSORBIDE MONONITRATE ER 60 MG PO TB24: ORAL_TABLET | ORAL | 0 refills | Status: DC

## 2019-08-11 DIAGNOSIS — H401113 Primary open-angle glaucoma, right eye, severe stage: Secondary | ICD-10-CM | POA: Diagnosis not present

## 2019-08-11 DIAGNOSIS — H04123 Dry eye syndrome of bilateral lacrimal glands: Secondary | ICD-10-CM | POA: Diagnosis not present

## 2019-08-11 DIAGNOSIS — H43813 Vitreous degeneration, bilateral: Secondary | ICD-10-CM | POA: Diagnosis not present

## 2019-08-11 DIAGNOSIS — H353132 Nonexudative age-related macular degeneration, bilateral, intermediate dry stage: Secondary | ICD-10-CM | POA: Diagnosis not present

## 2019-08-11 DIAGNOSIS — H0102B Squamous blepharitis left eye, upper and lower eyelids: Secondary | ICD-10-CM | POA: Diagnosis not present

## 2019-08-11 DIAGNOSIS — H0102A Squamous blepharitis right eye, upper and lower eyelids: Secondary | ICD-10-CM | POA: Diagnosis not present

## 2019-08-11 DIAGNOSIS — Z9889 Other specified postprocedural states: Secondary | ICD-10-CM | POA: Diagnosis not present

## 2019-08-11 DIAGNOSIS — Z961 Presence of intraocular lens: Secondary | ICD-10-CM | POA: Diagnosis not present

## 2019-08-11 DIAGNOSIS — H401121 Primary open-angle glaucoma, left eye, mild stage: Secondary | ICD-10-CM | POA: Diagnosis not present

## 2019-08-25 ENCOUNTER — Telehealth: Payer: Self-pay | Admitting: Cardiovascular Disease

## 2019-08-25 MED ORDER — FUROSEMIDE 40 MG PO TABS: ORAL_TABLET | ORAL | 3 refills | Status: DC

## 2019-08-25 NOTE — Telephone Encounter (Signed)
Rx sent to [facility]

## 2019-08-25 NOTE — Telephone Encounter (Signed)
New message   Patient wife states that the patient needs furosemide (LASIX) 40 MG tablet sent [place], [place]

## 2019-08-26 ENCOUNTER — Other Ambulatory Visit: Payer: Self-pay | Admitting: Cardiovascular Disease

## 2019-08-26 MED ORDER — FUROSEMIDE 40 MG PO TABS: ORAL_TABLET | ORAL | 0 refills | Status: DC

## 2019-08-26 NOTE — Telephone Encounter (Signed)
Rx(s) sent to pharmacy electronically.  

## 2019-08-26 NOTE — Telephone Encounter (Signed)
  *  STAT* If patient is at the pharmacy, call can be transferred to refill team.   1. Which medications need to be refilled? (please list name of each medication and dose if known) furosemide (LASIX) 40 MG tablet  2. Which pharmacy/location (including street and city if local pharmacy) is medication to be sent to?[facility] [place], [address]  3. Do they need a 30 day or 90 day supply? 30 days   Pt's wife calling, she said pt needs emergency refill since pt only have 1 pill left. They haven't receive the refill from [facility]

## 2019-09-03 DIAGNOSIS — E063 Autoimmune thyroiditis: Secondary | ICD-10-CM | POA: Diagnosis not present

## 2019-09-03 DIAGNOSIS — I2510 Atherosclerotic heart disease of native coronary artery without angina pectoris: Secondary | ICD-10-CM | POA: Diagnosis not present

## 2019-09-03 DIAGNOSIS — E7849 Other hyperlipidemia: Secondary | ICD-10-CM | POA: Diagnosis not present

## 2019-09-03 DIAGNOSIS — I10 Essential (primary) hypertension: Secondary | ICD-10-CM | POA: Diagnosis not present

## 2019-10-05 ENCOUNTER — Other Ambulatory Visit: Payer: Self-pay | Admitting: Cardiovascular Disease

## 2019-10-08 ENCOUNTER — Ambulatory Visit: Payer: Medicare Other | Admitting: Cardiovascular Disease

## 2019-10-25 ENCOUNTER — Other Ambulatory Visit: Payer: Self-pay

## 2019-10-25 ENCOUNTER — Encounter: Payer: Self-pay | Admitting: Cardiovascular Disease

## 2019-10-25 ENCOUNTER — Ambulatory Visit (INDEPENDENT_AMBULATORY_CARE_PROVIDER_SITE_OTHER): Payer: Medicare Other | Admitting: Cardiovascular Disease

## 2019-10-25 VITALS — BP 146/68 | HR 60 | Ht 68.0 in | Wt 141.6 lb

## 2019-10-25 DIAGNOSIS — I25119 Atherosclerotic heart disease of native coronary artery with unspecified angina pectoris: Secondary | ICD-10-CM | POA: Diagnosis not present

## 2019-10-25 DIAGNOSIS — I440 Atrioventricular block, first degree: Secondary | ICD-10-CM | POA: Diagnosis not present

## 2019-10-25 DIAGNOSIS — E785 Hyperlipidemia, unspecified: Secondary | ICD-10-CM

## 2019-10-25 DIAGNOSIS — I10 Essential (primary) hypertension: Secondary | ICD-10-CM

## 2019-10-25 DIAGNOSIS — E039 Hypothyroidism, unspecified: Secondary | ICD-10-CM

## 2019-10-25 DIAGNOSIS — M25473 Effusion, unspecified ankle: Secondary | ICD-10-CM

## 2019-10-25 MED ORDER — ISOSORBIDE MONONITRATE ER 60 MG PO TB24: ORAL_TABLET | ORAL | 3 refills | Status: DC

## 2019-10-25 NOTE — Patient Instructions (Signed)
Medication Instructions:  INCREASE YOUR ISOSORBIDE TO 1.5 TABS (90MG ) IN THE MORNING AND 1/2 TABLET (30MG ) IN THE EVENING *If you need a refill on your cardiac medications before your next appointment, please call your pharmacy*   Lab Work: FASTING LABS: CMET CBC TSH LIPID If you have labs (blood work) drawn today and your tests are completely normal, you will receive your results only by: Marland Kitchen MyChart Message (if you have MyChart) OR . A paper copy in the mail If you have any lab test that is abnormal or we need to change your treatment, we will call you to review the results.    Follow-Up: At [facility], you and your health needs are our priority.  As part of our continuing mission to provide you with exceptional heart care, we have created designated Provider Care Teams.  These Care Teams include your primary Cardiologist (physician) and Advanced Practice Providers (APPs -  Physician Assistants and Nurse Practitioners) who all work together to provide you with the care you need, when you need it.  We recommend signing up for the patient portal called "MyChart".  Sign up information is provided on this After Visit Summary.  MyChart is used to connect with patients for Virtual Visits (Telemedicine).  Patients are able to view lab/test results, encounter notes, upcoming appointments, etc.  Non-urgent messages can be sent to your provider as well.   To learn more about what you can do with MyChart, go to [email or web address].    Your next appointment:   6 month(s)  The format for your next appointment:   In Person  Provider:   Shelva Majestic, MD

## 2019-10-25 NOTE — Progress Notes (Signed)
Patient ID: Dale [name], male   DOB: 07-07-1928, 86 y.o.   MRN: [id number]    Primary M.D.: Dr. Redmond School  HPI: Dale [name] is a 86 y.o. male who presents to the office for a 7 month cardiology evaluation.  Dale [name] has known CAD and underwent initial intervention to his RCA in 2000. In 2009 a Cypher stent was placed beyond the acute margin of his RCA. His last cardiac catheterization was in September 2013 by Dr. [name] which showed 30% LAD narrowing after the second diagonal vessel, and normal left circumflex coronary artery, and patent RCA stents.  He has a history of hypothyroidism on Synthroid replacement and has a history of hyperlipidemia. He had been on on Crestor 5 mg plus Zetia 10 mg; however, due to recent increased cost of Zetia, this was discontinued and he has been on Crestor 20 mg daily.  He also has a history of GERD treated with Protonix, and he continues to be on dual antiplatelet therapy. He is responsive to Plavix on previous P2 Y12 testing.  When I saw him in 2016 he had experienced 3-4 episodes of chest pain over the six-month period.  Most of these episodes have occurred at night and were nitrate responsive.  He states that they were similar to his previous discomfort.  When I saw him, I further titrated his isosorbide mononitrate to 60 mg in the morning and 30 mg at night.   He saw [name], [place] in August 2018 with weight loss, weakness, and vague chest pain.  He felt his chest pain was atypical and not ischemic.  An echo Doppler study on 12/23/2016 showed an EF of 55-60%.  He was hospitalized on August 29 through January 03, 2017 at  [facility].  Apparently he had a nuclear stress test which did not reveal any reversible ischemia.  He was also felt to have acute sinusitis for which he was treated with antibiotics.  He has a history of renal cell CA.Marland Kitchen     When I saw him in October 2018, his chest pain had resolved. He underwent a carotid duplex study in October 2018  which showed mild plaque bilaterally at 1-39%. He denies increasing episodes of chest pain.  At times he notes rare chest discomfort which is short-lived.  He had developed some gross hematuria and was found to have a kidney stone which he underwent cystoscopy, left ureteral pyelogram with fluoroscopic interpretation, left ureteroscopy, holmium laser lithotripsy and extraction of left mid ureteral stone, placement of 24 x 6 French contour double-J stent, without tether by Dr [name] on 06/16/2017.  I saw him in March 2019.  Since that time, he has been seen by [name], PAC on 2 occasions in October.  He was seen initially on February 03, 2018 for bilateral ankle edema at which time his hydrochlorothiazide was switched to Lasix.  When seen in follow-up he continued to have ankle edema and Lasix was further titrated to 40 mg and potassium supplementation was increased.  I saw him in January 2020.  Several weeks prior to that evaluation he had experienced an episode of sharp chest ache which was nonexertional.    He denied recurrent anginal type symptoms.  He was sleepy during the day but admits that he is sleeping well at night.  He typically goes to bed at 11 PM and wakes up at 7 AM.    When I saw him in May 2020, he had noticed progressive lower extremity  leg edema for several weeks previously.  He apparently was not taking his Lasix in the morning but had been taking this at 5 PM.  He denied chest pain, awareness of abnormal heart rhythm, PND orthopnea.  During that evaluation, I suggested that he take his Lasix 40 mg in the morning and for the next several days take 20 mg in the later afternoon and if edema persisted to continue to take the Lasix perhaps every other day in the afternoon as well.  He states typically he does not take his Lasix in the morning but typically takes his 40 mg at lunchtime.  He then waits till suppertime around 7 PM to take an additional 40 mg of Lasix which he has been doing on  most days.  He urinates until 10 PM.  He has not been taking the Lasix as soon as he wakes up because then he states he cannot do anything without only he continues to experience ankle swelling bilaterally left greater than right.  He has not been using support stockings.  At times he may notice a mild arrhythmia.  He denied presyncope or syncope or recurrent anginal symptoms.  During that evaluation, I recommended that he continue to use support stockings.  Due to his frequent PVCs noted on ECG I slightly titrated his metoprolol from 12.5 mg twice a day to 25 mg in the morning and 12.5 mg at night.    When I last saw him in November 2020 he felt improved from his July 2020 evaluation  with titration of beta-blocker therapy and noticed resolution of his previous sense of palpitations.  His peripheral edema has improved with reinstitution of support stockings.  He denied any recurrent anginal symptoms.  He denied any significant shortness of breath.  Since his last evaluation, he at times admits to a vague chest pain which has been nitrate responsive.  His peripheral edema has improved with leg support stockings but admits that when he wears them oftentimes his legs get sore.  He is unaware of any palpitations.  He denies presyncope or syncope.  He has continued to be on DAPT with aspirin/Plavix.  He is on furosemide 40 mg, losartan 100 mg and metoprolol tartrate 25 mg in the morning and 12.5 mg in the evening.  He has been taking isosorbide 60 mg in the morning and 30 mg at night.  He presents for evaluation.  Past Medical History:  Diagnosis Date  . Allergic rhinitis   . Anal fissure   . Anginal pain ([place])   . Asthma   . Cancer ([place])    skin  . Chest pain   . CHF (congestive heart failure) ([place])   . Coronary heart disease    s/p stenting. cath in 01/2012 noncritical occlusion  . Dysrhythmia    1st degree heart block  . GERD (gastroesophageal reflux disease)   . Glaucoma   . Hiatal hernia   .  Hyperlipidemia   . Hypertension   . Hypothyroidism   . Idiopathic thrombocytopenic purpura ([place]) 2002  . Macular degeneration   . Nephrolithiasis   . PUD (peptic ulcer disease)    remote  . Sarcoidosis    pulmonary  . Schatzki's ring     Past Surgical History:  Procedure Laterality Date  . cardiac stents    . COLONOSCOPY  10/30/2006   Normal rectum, sigmoid diverticula.Remainder of colonic mucosa appeared normal.  . CORONARY ANGIOPLASTY WITH STENT PLACEMENT     about 10 years ago  per pt (around 2007)  . CYSTOSCOPY WITH RETROGRADE PYELOGRAM, URETEROSCOPY AND STENT PLACEMENT Left 06/16/2017   Procedure: CYSTOSCOPY WITH RETROGRADE PYELOGRAM, URETEROSCOPY,STONE EXTRACTION  AND STENT PLACEMENT;  Surgeon: Franchot Gallo, MD;  Location: [facility];  Service: Urology;  Laterality: Left;  . ESOPHAGOGASTRODUODENOSCOPY  06/19/2004   Two esophageal rings and esophageal web as described above.  All of these were disrupted by passing 56-French Venia Minks dilator/ Candida esophagitis,which appears to be incidental given history of   antibiotic use, but nevertheless will be treated.  . ESOPHAGOGASTRODUODENOSCOPY  10/30/2006   Distal tandem esophageal ring status post dilation disruption as  described above.  Otherwise normal esophagus/  Small hiatal hernia otherwise normal stomach, D1 and D2  . ESOPHAGOGASTRODUODENOSCOPY N/A 03/22/2015   Dr.Rourk- noncritical schatzki's ring and hiatal hernia-o/w normal EGD.   Marland Kitchen ESOPHAGOGASTRODUODENOSCOPY (EGD) WITH ESOPHAGEAL DILATION  03/04/2012   RMR- schatzki's ring, hiatal hernia, polypoid gastric mucosa, bx= minimally active gastritis.  Marland Kitchen HOLMIUM LASER APPLICATION Left 9/89/2119   Procedure: HOLMIUM LASER APPLICATION;  Surgeon: Franchot Gallo, MD;  Location: [facility];  Service: Urology;  Laterality: Left;  . IR GENERIC HISTORICAL  03/06/2016   IR RADIOLOGIST EVAL & MGMT 03/06/2016 Aletta Edouard, MD [facility] INTERV RAD  . IR GENERIC HISTORICAL  06/18/2016   IR  RADIOLOGIST EVAL & MGMT 06/18/2016 Aletta Edouard, MD [facility] INTERV RAD  . IR RADIOLOGIST EVAL & MGMT  10/01/2016  . IR RADIOLOGIST EVAL & MGMT  10/15/2017  . IR RADIOLOGIST EVAL & MGMT  12/24/2018  . LEFT HEART CATH N/A 02/02/2012   Procedure: LEFT HEART CATH;  Surgeon: Lorretta Harp, MD;  Location: [facility] CATH LAB;  Service: Cardiovascular;  Laterality: N/A;  . MEDIASTINOSCOPY     for dx sarcoid  . RADIOLOGY WITH ANESTHESIA Left 05/17/2016   Procedure: left renal ablation;  Surgeon: Aletta Edouard, MD;  Location: [facility];  Service: Radiology;  Laterality: Left;    Allergies  Allergen Reactions  . Azithromycin Other (See Comments)    Sore mouth and fever blisters around mouth, sores in nose area as well  . Doxazosin Shortness Of Breath  . Acetaminophen Other (See Comments)    REACTION: UNKNOWN REACTION  . Atenolol Other (See Comments)    REACTION: UNKNOWN REACTION  . Hydrocodone Nausea And Vomiting  . Hydrocodone-Acetaminophen Nausea Only  . Levofloxacin Other (See Comments)    Caused stomach problems.  . Morphine Other (See Comments)    "made me crazy"  . Penicillins Nausea And Vomiting and Other (See Comments)    Has patient had a PCN reaction causing immediate rash, facial/tongue/throat swelling, SOB or lightheadedness with hypotension: No Has patient had a PCN reaction causing severe rash involving mucus membranes or skin necrosis: No Has patient had a PCN reaction that required hospitalization No Has patient had a PCN reaction occurring within the last 10 years: No If all of the above answers are "NO", then may proceed with Cephalosporin use.   . Sulfonamide Derivatives Nausea And Vomiting    Current Outpatient Medications  Medication Sig Dispense Refill  . aspirin 81 MG tablet Take 81 mg by mouth 3 (three) times a week.     Marland Kitchen atorvastatin (LIPITOR) 40 MG tablet TAKE 1 TABLET BY MOUTH IN  THE EVENING 90 tablet 3  . brimonidine (ALPHAGAN) 0.2 % ophthalmic solution Place 1 drop  into both eyes 2 (two) times daily.    . clopidogrel (PLAVIX) 75 MG tablet Take 1 tablet (75 mg total) by mouth daily. [place]  tablet 3  . dorzolamide (TRUSOPT) 2 % ophthalmic solution Place 1 drop into both eyes 2 (two) times daily.     . furosemide (LASIX) 40 MG tablet TAKE 1 TABLET(40 MG) BY MOUTH DAILY- take 20 mg additional after 4:00 pm if continued swelling as needed. 45 tablet 0  . guaiFENesin (MUCINEX) 600 MG 12 hr tablet Take 1,200 mg by mouth daily.     . isosorbide mononitrate (IMDUR) 60 MG 24 hr tablet TAKE 1.5 TABLETS (90MG) BY MOUTH IN  THE MORNING AND ONE-HALF TABLET (30MG) BY MOUTH AT NIGHT. 180 tablet 3  . latanoprost (XALATAN) 0.005 % ophthalmic solution Place 1 drop into both eyes at bedtime.    Marland Kitchen levothyroxine (SYNTHROID) 100 MCG tablet TAKE 1 TABLET BY MOUTH  DAILY 90 tablet 3  . losartan (COZAAR) 100 MG tablet TAKE 1 TABLET BY MOUTH  DAILY 90 tablet 3  . metoprolol tartrate (LOPRESSOR) 25 MG tablet Take 1 tablet (25 mg) in the morning, and 0.5 tablet (12.5 mg) in the evening. 135 tablet 3  . mometasone (ELOCON) 0.1 % ointment Apply 1 application topically daily.     . Multiple Vitamin (MULTIVITAMIN) tablet Take 1 tablet by mouth every evening.     . Multiple Vitamins-Minerals (PRESERVISION/LUTEIN) CAPS Take 1 capsule by mouth 2 (two) times daily.     . nitroGLYCERIN (NITROSTAT) 0.4 MG SL tablet Place 1 tablet (0.4 mg total) under the tongue every 5 (five) minutes as needed. For chest pain 25 tablet 3  . pantoprazole (PROTONIX) 40 MG tablet Take 1 tablet (40 mg total) by mouth daily before breakfast. 90 tablet 1  . potassium chloride SA (KLOR-CON) 20 MEQ tablet Take 1 tablet (20 mEq total) by mouth daily. 90 tablet 3  . timolol (TIMOPTIC) 0.5 % ophthalmic solution Place 1 drop into both eyes daily.     Marland Kitchen triamcinolone (KENALOG) 0.1 % cream Apply 1 application topically 2 (two) times daily as needed (for irritation).     . vitamin C (ASCORBIC ACID) 500 MG tablet Take 500 mg by mouth  daily.     . vitamin E 200 UNIT capsule Take 200 Units by mouth daily at 12 noon.      No current facility-administered medications for this visit.    Social History   Socioeconomic History  . Marital status: Married    Spouse name: Not on file  . Number of children: 1  . Years of education: Not on file  . Highest education level: Not on file  Occupational History  . 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 Smoking status: Former Smoker    Packs/day: 0.10    Years: 2.00    Pack years: 0.20    Types: Cigarettes, Cigars    Quit date: 05/06/1970    Years since quitting: 49.5  . Smokeless tobacco: Never Used  Vaping Use  . Vaping Use: Never used  Substance and Sexual Activity  . Alcohol use: No    Alcohol/week: 0.0 standard drinks  . Drug use: No  . Sexual activity: Never  Other Topics Concern  . Not on file  Social History Narrative  . Not on file   Social Determinants of Health   Financial Resource Strain:   . Difficulty of Paying Living Expenses:   Food Insecurity:   . Worried About Charity fundraiser in the Last Year:   . [place] in the  Last Year:   Transportation Needs:   . Film/video editor (Medical):   Marland Kitchen Lack of Transportation (Non-Medical):   Physical Activity:   . Days of Exercise per Week:   . Minutes of Exercise per Session:   Stress:   . Feeling of Stress :   Social Connections:   . Frequency of Communication with Friends and Family:   . Frequency of Social Gatherings with Friends and Family:   . Attends Religious Services:   . Active Member of Clubs or Organizations:   . Attends Archivist Meetings:   Marland Kitchen Marital Status:   Intimate Partner Violence:   . Fear of Current or Ex-Partner:   . Emotionally Abused:   Marland Kitchen Physically Abused:   . Sexually Abused:    Additional social history is notable that he is married and lives with his wife. He quit smoking over 40 years ago.  Has one child. He remains active.  Family History  Problem Relation Age of Onset  . Heart disease Father        deceased age 22  . Stroke Mother   . Alzheimer's disease Mother   . Heart attack Brother        deceased age 72  . Cancer Other        niece  . Colon cancer Neg Hx    ROS General: Negative; No fevers, chills, or night sweats; Positive for weight loss HEENT: Positive for recent sinusitis.  No changes in vision., difficulty swallowing Pulmonary: Negative; No cough, wheezing, shortness of breath, hemoptysis Cardiovascular: See history of present illness GI: Negative; No nausea, vomiting, diarrhea, or abdominal pain GU: History of renal cell CA status post cryoablation; status post renal stone removal Musculoskeletal: Negative; no myalgias, joint pain, or weakness Hematologic/Oncology: Negative; no easy bruising, bleeding Endocrine: Positive for hypothyroidism; no diabetes Neuro: Negative; no changes in balance, headaches Skin: Negative; No rashes or skin lesions Psychiatric: Negative; No behavioral problems, depression Sleep: Negative; No snoring, daytime sleepiness, hypersomnolence, bruxism, restless legs, hypnogognic hallucinations, no cataplexy Other comprehensive 14 point system review is negative.   PE BP (!) 146/68   Pulse 60   Ht _0  (1.727 m)   Wt 141 lb 9.6 oz (64.2 kg)   BMI 21.53 kg/m    Repeat blood pressure by me 146/72  Wt Readings from Last 3 Encounters:  10/25/19 141 lb 9.6 oz (64.2 kg)  03/18/19 136 lb (61.7 kg)  11/24/18 139 lb 3.2 oz (63.1 kg)   General: Alert, oriented, no distress.  Skin: normal turgor, no rashes, warm and dry HEENT: Normocephalic, atraumatic. Pupils equal round and reactive to light; sclera anicteric; extraocular muscles intact;  Nose without nasal septal hypertrophy Mouth/Parynx benign; Mallinpatti scale 3 Neck: No JVD, no carotid bruits; normal carotid upstroke Lungs: clear to ausculatation and percussion; no  wheezing or rales Chest wall: without tenderness to palpitation Heart: PMI not displaced, RRR, s1 s2 normal, 1/6 systolic murmur, no diastolic murmur, no rubs, gallops, thrills, or heaves Abdomen: soft, nontender; no hepatosplenomehaly, BS+; abdominal aorta nontender and not dilated by palpation. Back: no CVA tenderness Pulses 2+ Musculoskeletal: full range of motion, normal strength, no joint deformities Extremities: Trivial residual ankle edema while wearing support stockings no clubbing cyanosis, Homan's sign negative  Neurologic: grossly nonfocal; Cranial nerves grossly wnl Psychologic: Normal mood and affect   ECG (independently read by me): Normal sinus rhythm at 60 bpm, first-degree AV block.  PR nterval to 244 ms  March 18, 2019 ECG (  independently read by me): Normal sinus rhythm at 62 bpm.  First-degree AV block with a parable 244 ms.  1 isolated PVC.  No ST segment change  July 2020 ECG (independently read by me):Sinus rhythm at 68 bpm with first-degree AV block ( PR interval 236 msec) and frequent unifocal PVCs  Sep 09, 2018 ECG (independently read by me): Sinus bradycardia with first-degree AV block with ventricular rate of 59 bpm.  PR interval 244 ms.  June 02, 2018 ECG (independently read by me): Sinus rhythm at 62 bpm with first-degree block with apparent of what to 40 ms.  No significant ST-T changes.  July 24, 2017 ECG (independently read by me): normal sinus rhythm at 60 bpm with first-degree AV block.  PR interval 236 ms.  QTc interval normal.  No ectopy.  October 2018 ECG (independently read by me): Sinus bradycardia 50 bpm.  First degree AV block with a PR interval at 224 ms.  No syncope ST changes.  September 2017 ECG (independently read by me): Sinus bradycardia 59 bpm.  First-degree AV block with PR interval 240 ms.  No significant ST-T changes.  November 2016 ECG (independently read by me): Sinus bradycardia at 55 beats per minute with first-degree AV block.   PR interval 246 ms.  No significant ST-T changes.  May 2016 ECG (independently read by me): Sinus rhythm with first-degree heart block.  PR interval 226.  No ST segment changes.  February 2016 ECG (independently read by me): Sinus rhythm at 65 bpm.  Mild first-degree AV block with a PR interval 228 ms.  No significant ST segment changes.  December 2015 ECG (independently read by me): Sinus bradycardia 57 bpm.  Borderline first-degree AV block with a PR interval 216 daily.  Seconds.  QTc interval is normal at 373 ms.  June 2015 ECG (independently read by me): Sinus bradycardia 54 beats per minute.  First degree block with a PR interval of 232 ms.  No significant ST-T changes.  Prior ECG: Sinus bradycardia 54 beats per minute. PR interval 206 ms. QTc interval normal  LABS: BMP Latest Ref Rng & Units 10/26/2019 11/24/2018 10/06/2018  Glucose 65 - 99 mg/dL 96 100(H) -  BUN 8 - 27 mg/dL 25 31(H) -  Creatinine 0.76 - 1.27 mg/dL 1.07 1.19 0.90  BUN/Creat Ratio 10 - 24 23 26(H) -  Sodium 134 - 144 mmol/L 139 139 -  Potassium 3.5 - 5.2 mmol/L 4.5 4.7 -  Chloride 96 - 106 mmol/L 104 103 -  CO2 20 - 29 mmol/L 25 23 -  Calcium 8.6 - 10.2 mg/dL 9.6 9.7 -   Hepatic Function Latest Ref Rng & Units 10/26/2019 06/12/2018 09/08/2015  Total Protein 6.0 - 8.5 g/dL 7.3 6.4 6.3  Albumin 3.6 - 4.6 g/dL 4.0 4.1 3.9  AST 0 - 40 IU/L _0 ALT 0 - 44 IU/L _1 Alk Phosphatase 48 - 121 IU/L 84 97 76  Total Bilirubin 0.0 - 1.2 mg/dL 0.5 0.3 0.7  Bilirubin, Direct - - - -   CBC Latest Ref Rng & Units 10/26/2019 06/12/2018 05/27/2017  WBC 3.4 - 10.8 x10E3/uL 5.5 4.7 5.3  Hemoglobin 13.0 - 17.7 g/dL 11.3(L) 10.8(L) 11.0(L)  Hematocrit 37.5 - 51.0 % 33.5(L) 31.3(L) 32.4(L)  Platelets 150 - 450 x10E3/uL 196 189 198   Lab Results  Component Value Date   MCV 107 (H) 10/26/2019   MCV 98 (H) 06/12/2018   MCV 101.9 (H) 05/27/2017   Lab  Results  Component Value Date   TSH 3.010 10/26/2019  No results found  for: HGBA1C   Lipid Panel     Component Value Date/Time   CHOL 123 10/26/2019 1013   TRIG 107 10/26/2019 1013   HDL 41 10/26/2019 1013   CHOLHDL 3.0 10/26/2019 1013   CHOLHDL 2.6 01/02/2017 0612   VLDL 10 01/02/2017 0612   LDLCALC 62 10/26/2019 1013   RADIOLOGY: No results found.  IMPRESSION:  1. Coronary artery disease involving native coronary artery of native heart with angina pectoris ([place])   2. Essential hypertension   3. Hyperlipidemia with target LDL less than 70   4. First degree AV block   5. Hypothyroidism, unspecified type   6. Ankle edema     ASSESSMENT AND PLAN: [name] is an 86 years old gentleman who has known CAD and underwent initial intervention to his RCA in 2000 and subsequent intervention in 2009. At last catheterization in 2013 his RCA stents were patent and he had mild 30% LAD narrowing.  He has had  leg edema which has necessitated changing his diuretic regimen to furosemide in place of hydrochlorothiazide.  This initially improved his edema but on prior evaluation due to progressive lower swelling his furosemide dose was increased to 40 mg in the morning and 20 mg at approximately 4 PM if continued swelling was present.  Since he started to wear support stockings, his lower extremity edema has consistently improved.  Presently there is trivial ankle swelling with him wearing his support stockings today.  He does admit to occasional leg soreness when he wears the support stockings.  He does have chronic anginal symptoms typically class II and on rare occasions take sublingual nitroglycerin with relief.  I have suggested slight additional titration of his isosorbide and he will increase his morning dose to 90 mg but continue to 30 mg evening dose.  His blood pressure today is mildly increased at 146/72 on recheck by me and this should be improved somewhat with his intraincreased nitrate dose and he continues to be on metoprolol 25 mg in the morning and 12.5  mg at night in addition to losartan 100 mg and furosemide.  He has first-degree AV block on ECG.  He continues to be on levothyroxine for hypothyroidism.  He is on atorvastatin for hyperlipidemia.  He has not had recent laboratory over the year but in February 2020 LDL was excellent at 48.  I recommend a repeat  fasting laboratory consisting of a comprehensive metabolic panel, CBC TSH and lipid studies.  I will see him in 6 months for reevaluation or sooner as needed.    Troy Sine, MD, [facility]  10/27/2019 2:45 PM

## 2019-10-27 ENCOUNTER — Encounter: Payer: Self-pay | Admitting: Cardiovascular Disease

## 2019-10-27 LAB — COMPREHENSIVE METABOLIC PANEL
ALT: 21 IU/L (ref 0–44)
AST: 20 IU/L (ref 0–40)
Albumin/Globulin Ratio: 1.2 (ref 1.2–2.2)
Albumin: 4 g/dL (ref 3.6–4.6)
Alkaline Phosphatase: 84 IU/L (ref 48–121)
BUN/Creatinine Ratio: 23 (ref 10–24)
BUN: 25 mg/dL (ref 8–27)
Bilirubin Total: 0.5 mg/dL (ref 0.0–1.2)
CO2: 25 mmol/L (ref 20–29)
Calcium: 9.6 mg/dL (ref 8.6–10.2)
Chloride: 104 mmol/L (ref 96–106)
Creatinine, Ser: 1.07 mg/dL (ref 0.76–1.27)
GFR calc Af Amer: 71 mL/min/{1.73_m2} (ref >59)
GFR calc non Af Amer: 61 mL/min/{1.73_m2} (ref >59)
Globulin, Total: 3.3 g/dL (ref 1.5–4.5)
Glucose: 96 mg/dL (ref 65–99)
Potassium: 4.5 mmol/L (ref 3.5–5.2)
Sodium: 139 mmol/L (ref 134–144)
Total Protein: 7.3 g/dL (ref 6.0–8.5)

## 2019-10-27 LAB — LIPID PANEL
Chol/HDL Ratio: 3 ratio (ref 0.0–5.0)
Cholesterol, Total: 123 mg/dL (ref 100–199)
HDL: 41 mg/dL (ref >39)
LDL Chol Calc (NIH): 62 mg/dL (ref 0–99)
Triglycerides: 107 mg/dL (ref 0–149)
VLDL Cholesterol Cal: 20 mg/dL (ref 5–40)

## 2019-10-27 LAB — CBC
Hematocrit: 33.5 % — ABNORMAL LOW (ref 37.5–51.0)
Hemoglobin: 11.3 g/dL — ABNORMAL LOW (ref 13.0–17.7)
MCH: 36.2 pg — ABNORMAL HIGH (ref 26.6–33.0)
MCHC: 33.7 g/dL (ref 31.5–35.7)
MCV: 107 fL — ABNORMAL HIGH (ref 79–97)
Platelets: 196 10*3/uL (ref 150–450)
RBC: 3.12 x10E6/uL — ABNORMAL LOW (ref 4.14–5.80)
RDW: 12.7 % (ref 11.6–15.4)
WBC: 5.5 10*3/uL (ref 3.4–10.8)

## 2019-10-27 LAB — TSH: TSH: 3.01 u[IU]/mL (ref 0.450–4.500)

## 2019-11-03 DIAGNOSIS — I2510 Atherosclerotic heart disease of native coronary artery without angina pectoris: Secondary | ICD-10-CM | POA: Diagnosis not present

## 2019-11-03 DIAGNOSIS — I10 Essential (primary) hypertension: Secondary | ICD-10-CM | POA: Diagnosis not present

## 2019-11-03 DIAGNOSIS — E7849 Other hyperlipidemia: Secondary | ICD-10-CM | POA: Diagnosis not present

## 2019-11-03 DIAGNOSIS — E063 Autoimmune thyroiditis: Secondary | ICD-10-CM | POA: Diagnosis not present

## 2019-11-05 ENCOUNTER — Other Ambulatory Visit: Payer: Self-pay

## 2019-11-05 DIAGNOSIS — E785 Hyperlipidemia, unspecified: Secondary | ICD-10-CM

## 2019-11-05 DIAGNOSIS — I10 Essential (primary) hypertension: Secondary | ICD-10-CM

## 2019-11-05 DIAGNOSIS — I25119 Atherosclerotic heart disease of native coronary artery with unspecified angina pectoris: Secondary | ICD-10-CM

## 2019-11-05 DIAGNOSIS — Z79899 Other long term (current) drug therapy: Secondary | ICD-10-CM

## 2019-11-09 ENCOUNTER — Telehealth: Payer: Self-pay | Admitting: Cardiovascular Disease

## 2019-11-09 DIAGNOSIS — Z79899 Other long term (current) drug therapy: Secondary | ICD-10-CM

## 2019-11-09 DIAGNOSIS — I25119 Atherosclerotic heart disease of native coronary artery with unspecified angina pectoris: Secondary | ICD-10-CM

## 2019-11-09 DIAGNOSIS — E785 Hyperlipidemia, unspecified: Secondary | ICD-10-CM

## 2019-11-09 DIAGNOSIS — I10 Essential (primary) hypertension: Secondary | ICD-10-CM

## 2019-11-09 NOTE — Telephone Encounter (Signed)
Patients wife is calling again, stating that she was told that her husbands lab work was not good and that [name] wanted the patient to come into the office to be seen. She also stated the nurse was going to call her back with an appointment date/time? No notes in chart regarding this, please call.

## 2019-11-09 NOTE — Telephone Encounter (Signed)
Patient's wife states that she was told her husbands lab work was not good and that [name] wanted to see the patient back in the office. She states the nurse was to call her back with the appt day and time. I do not see any notes in reference to this, please advise.

## 2019-11-09 NOTE — Telephone Encounter (Signed)
Returned call to wife (DPR) she states that she is unsure "what she supposed to do with the lab". Informed her that she is to bring pt in for additional testing per TK note. Pt will be in tomorrow. Will await test results

## 2019-11-10 LAB — B12 AND FOLATE PANEL
Folate: >20 ng/mL (ref >3.0)
Vitamin B-12: 1251 pg/mL — ABNORMAL HIGH (ref 232–1245)

## 2019-11-14 ENCOUNTER — Other Ambulatory Visit: Payer: Self-pay | Admitting: Cardiovascular Disease

## 2019-11-15 DIAGNOSIS — H401121 Primary open-angle glaucoma, left eye, mild stage: Secondary | ICD-10-CM | POA: Diagnosis not present

## 2019-11-15 DIAGNOSIS — H43813 Vitreous degeneration, bilateral: Secondary | ICD-10-CM | POA: Diagnosis not present

## 2019-11-15 DIAGNOSIS — H0102B Squamous blepharitis left eye, upper and lower eyelids: Secondary | ICD-10-CM | POA: Diagnosis not present

## 2019-11-15 DIAGNOSIS — H401113 Primary open-angle glaucoma, right eye, severe stage: Secondary | ICD-10-CM | POA: Diagnosis not present

## 2019-11-15 DIAGNOSIS — Z961 Presence of intraocular lens: Secondary | ICD-10-CM | POA: Diagnosis not present

## 2019-11-15 DIAGNOSIS — H0102A Squamous blepharitis right eye, upper and lower eyelids: Secondary | ICD-10-CM | POA: Diagnosis not present

## 2019-11-15 DIAGNOSIS — H04123 Dry eye syndrome of bilateral lacrimal glands: Secondary | ICD-10-CM | POA: Diagnosis not present

## 2019-11-15 DIAGNOSIS — H353132 Nonexudative age-related macular degeneration, bilateral, intermediate dry stage: Secondary | ICD-10-CM | POA: Diagnosis not present

## 2019-11-15 DIAGNOSIS — Z9889 Other specified postprocedural states: Secondary | ICD-10-CM | POA: Diagnosis not present

## 2019-11-22 ENCOUNTER — Telehealth: Payer: Self-pay | Admitting: Cardiovascular Disease

## 2019-11-22 NOTE — Telephone Encounter (Signed)
Spoke to wife.  Informed patient's wife- Preliminary results given.  wife aware  Result have not been reviewed by Dr Claiborne Billings . Informed wife once  Result reviewed will contact patient

## 2019-11-22 NOTE — Telephone Encounter (Signed)
  Wife is calling regarding her husband's last lab results. They have not heard the results yet.

## 2019-12-01 NOTE — Telephone Encounter (Signed)
Reviewed results with pt .

## 2019-12-02 ENCOUNTER — Other Ambulatory Visit: Payer: Self-pay | Admitting: Interventional Radiology

## 2019-12-02 DIAGNOSIS — C642 Malignant neoplasm of left kidney, except renal pelvis: Secondary | ICD-10-CM

## 2019-12-03 DIAGNOSIS — I129 Hypertensive chronic kidney disease with stage 1 through stage 4 chronic kidney disease, or unspecified chronic kidney disease: Secondary | ICD-10-CM | POA: Diagnosis not present

## 2019-12-03 DIAGNOSIS — M069 Rheumatoid arthritis, unspecified: Secondary | ICD-10-CM | POA: Diagnosis not present

## 2019-12-03 DIAGNOSIS — N182 Chronic kidney disease, stage 2 (mild): Secondary | ICD-10-CM | POA: Diagnosis not present

## 2019-12-03 DIAGNOSIS — Z720 Tobacco use: Secondary | ICD-10-CM | POA: Diagnosis not present

## 2019-12-07 DIAGNOSIS — H0102B Squamous blepharitis left eye, upper and lower eyelids: Secondary | ICD-10-CM | POA: Diagnosis not present

## 2019-12-07 DIAGNOSIS — H401113 Primary open-angle glaucoma, right eye, severe stage: Secondary | ICD-10-CM | POA: Diagnosis not present

## 2019-12-07 DIAGNOSIS — Z9889 Other specified postprocedural states: Secondary | ICD-10-CM | POA: Diagnosis not present

## 2019-12-07 DIAGNOSIS — H43813 Vitreous degeneration, bilateral: Secondary | ICD-10-CM | POA: Diagnosis not present

## 2019-12-07 DIAGNOSIS — Z961 Presence of intraocular lens: Secondary | ICD-10-CM | POA: Diagnosis not present

## 2019-12-07 DIAGNOSIS — H353132 Nonexudative age-related macular degeneration, bilateral, intermediate dry stage: Secondary | ICD-10-CM | POA: Diagnosis not present

## 2019-12-07 DIAGNOSIS — H0102A Squamous blepharitis right eye, upper and lower eyelids: Secondary | ICD-10-CM | POA: Diagnosis not present

## 2019-12-07 DIAGNOSIS — H04123 Dry eye syndrome of bilateral lacrimal glands: Secondary | ICD-10-CM | POA: Diagnosis not present

## 2019-12-07 DIAGNOSIS — H401121 Primary open-angle glaucoma, left eye, mild stage: Secondary | ICD-10-CM | POA: Diagnosis not present

## 2019-12-13 ENCOUNTER — Telehealth: Payer: Self-pay | Admitting: Internal Medicine

## 2019-12-13 DIAGNOSIS — K219 Gastro-esophageal reflux disease without esophagitis: Secondary | ICD-10-CM

## 2019-12-13 NOTE — Telephone Encounter (Signed)
Pt came to front office asking for a refill on his pantoprazole 40 mg. He uses [facility]. [phone]

## 2019-12-13 NOTE — Telephone Encounter (Signed)
Routing to [facility] refill box. Pt takes Pantoprazole 40 mg daily. #90 capsules for 3 month supple

## 2019-12-17 MED ORDER — PANTOPRAZOLE SODIUM 40 MG PO TBEC: 40.0000 mg | DELAYED_RELEASE_TABLET | Freq: Every day | ORAL | 1 refills | Status: DC

## 2019-12-17 NOTE — Addendum Note (Signed)
Addended by: Gordy Levan, Siraj Dermody A on: 12/17/2019 01:17 PM   Modules accepted: Orders

## 2019-12-27 DIAGNOSIS — E7849 Other hyperlipidemia: Secondary | ICD-10-CM | POA: Diagnosis not present

## 2019-12-27 DIAGNOSIS — M1991 Primary osteoarthritis, unspecified site: Secondary | ICD-10-CM | POA: Diagnosis not present

## 2019-12-27 DIAGNOSIS — I10 Essential (primary) hypertension: Secondary | ICD-10-CM | POA: Diagnosis not present

## 2019-12-27 DIAGNOSIS — M542 Cervicalgia: Secondary | ICD-10-CM | POA: Diagnosis not present

## 2019-12-27 DIAGNOSIS — I2510 Atherosclerotic heart disease of native coronary artery without angina pectoris: Secondary | ICD-10-CM | POA: Diagnosis not present

## 2019-12-27 DIAGNOSIS — Z1389 Encounter for screening for other disorder: Secondary | ICD-10-CM | POA: Diagnosis not present

## 2019-12-27 DIAGNOSIS — I700 Atherosclerosis of aorta: Secondary | ICD-10-CM | POA: Diagnosis not present

## 2019-12-27 DIAGNOSIS — J309 Allergic rhinitis, unspecified: Secondary | ICD-10-CM | POA: Diagnosis not present

## 2019-12-27 DIAGNOSIS — Z6820 Body mass index (BMI) 20.0-20.9, adult: Secondary | ICD-10-CM | POA: Diagnosis not present

## 2019-12-27 DIAGNOSIS — Z0000 Encounter for general adult medical examination without abnormal findings: Secondary | ICD-10-CM | POA: Diagnosis not present

## 2019-12-27 DIAGNOSIS — M47812 Spondylosis without myelopathy or radiculopathy, cervical region: Secondary | ICD-10-CM | POA: Diagnosis not present

## 2019-12-31 ENCOUNTER — Other Ambulatory Visit: Payer: Self-pay | Admitting: Gastroenterology

## 2019-12-31 ENCOUNTER — Ambulatory Visit (HOSPITAL_COMMUNITY)
Admission: RE | Admit: 2019-12-31 | Discharge: 2019-12-31 | Disposition: A | Discharge status: Home / Self Care | Payer: Medicare Other | Source: Ambulatory Visit | Attending: Interventional Radiology | Admitting: Interventional Radiology

## 2019-12-31 ENCOUNTER — Other Ambulatory Visit (HOSPITAL_COMMUNITY): Payer: Self-pay | Admitting: Psychiatry

## 2019-12-31 ENCOUNTER — Other Ambulatory Visit: Payer: Self-pay

## 2019-12-31 DIAGNOSIS — K7689 Other specified diseases of liver: Secondary | ICD-10-CM | POA: Diagnosis not present

## 2019-12-31 DIAGNOSIS — C642 Malignant neoplasm of left kidney, except renal pelvis: Secondary | ICD-10-CM | POA: Insufficient documentation

## 2019-12-31 DIAGNOSIS — I700 Atherosclerosis of aorta: Secondary | ICD-10-CM | POA: Diagnosis not present

## 2019-12-31 DIAGNOSIS — K8689 Other specified diseases of pancreas: Secondary | ICD-10-CM | POA: Diagnosis not present

## 2019-12-31 DIAGNOSIS — K219 Gastro-esophageal reflux disease without esophagitis: Secondary | ICD-10-CM

## 2019-12-31 LAB — POCT I-STAT CREATININE: Creatinine, Ser: 1 mg/dL (ref 0.61–1.24)

## 2019-12-31 MED ORDER — IOHEXOL 300 MG/ML  SOLN
100.0000 mL | Freq: Once | INTRAMUSCULAR | Status: AC | PRN
  Administered 2019-12-31: 100 mL via INTRAVENOUS

## 2020-01-03 DIAGNOSIS — R300 Dysuria: Secondary | ICD-10-CM | POA: Diagnosis not present

## 2020-01-03 DIAGNOSIS — N4289 Other specified disorders of prostate: Secondary | ICD-10-CM | POA: Diagnosis not present

## 2020-01-04 ENCOUNTER — Ambulatory Visit
Admission: RE | Admit: 2020-01-04 | Discharge: 2020-01-04 | Disposition: A | Discharge status: Home / Self Care | Payer: Medicare Other | Source: Ambulatory Visit | Attending: Interventional Radiology | Admitting: Interventional Radiology

## 2020-01-04 ENCOUNTER — Other Ambulatory Visit: Payer: Self-pay

## 2020-01-04 DIAGNOSIS — C642 Malignant neoplasm of left kidney, except renal pelvis: Secondary | ICD-10-CM | POA: Diagnosis not present

## 2020-01-04 DIAGNOSIS — Z9889 Other specified postprocedural states: Secondary | ICD-10-CM | POA: Diagnosis not present

## 2020-01-04 NOTE — Progress Notes (Signed)
Chief Complaint: Patient was consulted remotely today (TeleHealth) for follow-up after cryoablation of a 1.8 cm left renal carcinoma on 05/17/2016.  History of Present Illness: Dale [name] is a 86 y.o. 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  He states that he was diagnosed with a prostate infection yesterday at urgent care and was placed on antibiotics with symptoms of urinary urgency and burning with urination.  Past Medical History:  Diagnosis Date  . Allergic rhinitis   . Anal fissure   . Anginal pain ([place])   . Asthma   . Cancer ([place])    skin  . Chest pain   . CHF (congestive heart failure) ([place])   . Coronary heart disease    s/p stenting. cath in 01/2012 noncritical occlusion  . Dysrhythmia    1st degree heart block  . GERD (gastroesophageal reflux disease)   . Glaucoma   . Hiatal hernia   . Hyperlipidemia   . Hypertension   . Hypothyroidism   . Idiopathic thrombocytopenic purpura ([place]) 2002  . Macular degeneration   . Nephrolithiasis   . PUD (peptic ulcer disease)    remote  . Sarcoidosis    pulmonary  . Schatzki's ring     Past Surgical History:  Procedure Laterality Date  . cardiac stents    . COLONOSCOPY  10/30/2006   Normal rectum, sigmoid diverticula.Remainder of colonic mucosa appeared normal.  . CORONARY ANGIOPLASTY WITH STENT PLACEMENT     about 10 years ago per pt (around 2007)  . CYSTOSCOPY WITH RETROGRADE PYELOGRAM, URETEROSCOPY AND STENT PLACEMENT Left 06/16/2017   Procedure: CYSTOSCOPY WITH RETROGRADE PYELOGRAM, URETEROSCOPY,STONE EXTRACTION  AND STENT PLACEMENT;  Surgeon: Franchot Gallo, MD;  Location: [facility];  Service: Urology;  Laterality: Left;  . ESOPHAGOGASTRODUODENOSCOPY  06/19/2004   Two esophageal rings and esophageal web as described above.  All of these were disrupted by passing 56-French Venia Minks dilator/ Candida esophagitis,which appears to be incidental given history of    antibiotic use, but nevertheless will be treated.  . ESOPHAGOGASTRODUODENOSCOPY  10/30/2006   Distal tandem esophageal ring status post dilation disruption as  described above.  Otherwise normal esophagus/  Small hiatal hernia otherwise normal stomach, D1 and D2  . ESOPHAGOGASTRODUODENOSCOPY N/A 03/22/2015   Dr.Rourk- noncritical schatzki's ring and hiatal hernia-o/w normal EGD.   Marland Kitchen ESOPHAGOGASTRODUODENOSCOPY (EGD) WITH ESOPHAGEAL DILATION  03/04/2012   RMR- schatzki's ring, hiatal hernia, polypoid gastric mucosa, bx= minimally active gastritis.  Marland Kitchen HOLMIUM LASER APPLICATION Left 9/56/3875   Procedure: HOLMIUM LASER APPLICATION;  Surgeon: Franchot Gallo, MD;  Location: [facility];  Service: Urology;  Laterality: Left;  . IR GENERIC HISTORICAL  03/06/2016   IR RADIOLOGIST EVAL & MGMT 03/06/2016 Aletta Edouard, MD [facility] INTERV RAD  . IR GENERIC HISTORICAL  06/18/2016   IR RADIOLOGIST EVAL & MGMT 06/18/2016 Aletta Edouard, MD [facility] INTERV RAD  . IR RADIOLOGIST EVAL & MGMT  10/01/2016  . IR RADIOLOGIST EVAL & MGMT  10/15/2017  . IR RADIOLOGIST EVAL & MGMT  12/24/2018  . LEFT HEART CATH N/A 02/02/2012   Procedure: LEFT HEART CATH;  Surgeon: Lorretta Harp, MD;  Location: [facility] CATH LAB;  Service: Cardiovascular;  Laterality: N/A;  . MEDIASTINOSCOPY     for dx sarcoid  . RADIOLOGY WITH ANESTHESIA Left 05/17/2016   Procedure: left renal ablation;  Surgeon: Aletta Edouard, MD;  Location: [facility];  Service: Radiology;  Laterality: Left;    Allergies: Azithromycin, Doxazosin, Acetaminophen,  Atenolol, Hydrocodone, Hydrocodone-acetaminophen, Levofloxacin, Morphine, Penicillins, and Sulfonamide derivatives  Medications: Prior to Admission medications   Medication Sig Start Date End Date Taking? Authorizing Provider  aspirin 81 MG tablet Take 81 mg by mouth 3 (three) times a week.     [provider]  atorvastatin (LIPITOR) 40 MG tablet TAKE 1 TABLET BY MOUTH IN  THE EVENING 06/30/19   Troy Sine, MD  brimonidine (ALPHAGAN) 0.2 % ophthalmic solution Place 1 drop into both eyes 2 (two) times daily.    [provider]  clopidogrel (PLAVIX) 75 MG tablet Take 1 tablet (75 mg total) by mouth daily. 06/30/19   Troy Sine, MD  dorzolamide (TRUSOPT) 2 % ophthalmic solution Place 1 drop into both eyes 2 (two) times daily.     [provider]  furosemide (LASIX) 40 MG tablet TAKE 1 TABLET(40 MG) BY MOUTH DAILY- take 20 mg additional after 4:00 pm if continued swelling as needed. 08/26/19   Troy Sine, MD  guaiFENesin (MUCINEX) 600 MG 12 hr tablet Take 1,200 mg by mouth daily.     [provider]  isosorbide mononitrate (IMDUR) 60 MG 24 hr tablet TAKE 1.5 TABLETS (90MG ) BY MOUTH IN  THE MORNING AND ONE-HALF TABLET (30MG ) BY MOUTH AT NIGHT. 10/25/19   Troy Sine, MD  latanoprost (XALATAN) 0.005 % ophthalmic solution Place 1 drop into both eyes at bedtime.    [provider]  levothyroxine (SYNTHROID) 100 MCG tablet TAKE 1 TABLET BY MOUTH  DAILY 11/16/19   Troy Sine, MD  losartan (COZAAR) 100 MG tablet TAKE 1 TABLET BY MOUTH  DAILY 12/10/18   Troy Sine, MD  metoprolol tartrate (LOPRESSOR) 25 MG tablet Take 1 tablet (25 mg) in the morning, and 0.5 tablet (12.5 mg) in the evening. 07/29/19   Troy Sine, MD  mometasone (ELOCON) 0.1 % ointment Apply 1 application topically daily.     [provider]  Multiple Vitamin (MULTIVITAMIN) tablet Take 1 tablet by mouth every evening.     [provider]  Multiple Vitamins-Minerals (PRESERVISION/LUTEIN) CAPS Take 1 capsule by mouth 2 (two) times daily.     [provider]  nitroGLYCERIN (NITROSTAT) 0.4 MG SL tablet Place 1 tablet (0.4 mg total) under the tongue every 5 (five) minutes as needed. For chest pain 04/06/18   Leonie Man, MD  pantoprazole (PROTONIX) 40 MG tablet TAKE 1 TABLET BY MOUTH  DAILY BEFORE BREAKFAST 01/01/20   Annitta Needs, NP  potassium chloride SA  (KLOR-CON) 20 MEQ tablet Take 1 tablet (20 mEq total) by mouth daily. 06/15/19   Troy Sine, MD  timolol (TIMOPTIC) 0.5 % ophthalmic solution Place 1 drop into both eyes daily.     [provider]  triamcinolone (KENALOG) 0.1 % cream Apply 1 application topically 2 (two) times daily as needed (for irritation).     [provider]  vitamin C (ASCORBIC ACID) 500 MG tablet Take 500 mg by mouth daily.     [provider]  vitamin E 200 UNIT capsule Take 200 Units by mouth daily at 12 noon.     [provider]     Family History  Problem Relation Age of Onset  . Heart disease Father        deceased age 54  . Stroke Mother   . Alzheimer's disease Mother   . Heart attack Brother        deceased age 72  . Cancer  Other        niece  . Colon cancer Neg Hx     Social History   Socioeconomic History  . Marital status: Married    Spouse name: Not on file  . Number of children: 1  . Years of education: Not on file  . Highest education level: Not on file  Occupational History  . 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 Smoking status: Former Smoker    Packs/day: 0.10    Years: 2.00    Pack years: 0.20    Types: Cigarettes, Cigars    Quit date: 05/06/1970    Years since quitting: 49.6  . Smokeless tobacco: Never Used  Vaping Use  . Vaping Use: Never used  Substance and Sexual Activity  . Alcohol use: No    Alcohol/week: 0.0 standard drinks  . Drug use: No  . Sexual activity: Never  Other Topics Concern  . Not on file  Social History Narrative  . Not on file   Social Determinants of Health   Financial Resource Strain:   . Difficulty of Paying Living Expenses: Not on file  Food Insecurity:   . Worried About Charity fundraiser in the Last Year: Not on file  . Ran Out of Food in the Last Year: Not on file  Transportation Needs:   . Lack of Transportation (Medical): Not on file  .  Lack of Transportation (Non-Medical): Not on file  Physical Activity:   . Days of Exercise per Week: Not on file  . Minutes of Exercise per Session: Not on file  Stress:   . Feeling of Stress : Not on file  Social Connections:   . Frequency of Communication with Friends and Family: Not on file  . Frequency of Social Gatherings with Friends and Family: Not on file  . Attends Religious Services: Not on file  . Active Member of Clubs or Organizations: Not on file  . Attends Archivist Meetings: Not on file  . Marital Status: Not on file    ECOG Status: 0 - Asymptomatic  Review of Systems  Constitutional: Negative.   Respiratory: Negative.   Cardiovascular: Negative.   Gastrointestinal: Negative.   Genitourinary: Positive for dysuria and urgency.  Musculoskeletal: Negative.   Neurological: Negative.     Review of Systems: A 12 point ROS discussed and pertinent positives are indicated in the HPI above.  All other systems are negative.  Physical Exam No direct physical exam was performed (except for noted visual exam findings with Video Visits).   Vital Signs: There were no vitals taken for this visit.  Imaging: CT ABDOMEN W WO CONTRAST  Result Date: 12/31/2019 CLINICAL DATA:  Status post cryo ablation of left kidney clear cell carcinoma. EXAM: CT ABDOMEN WITHOUT AND WITH CONTRAST TECHNIQUE: Multidetector CT imaging of the abdomen was performed following the standard protocol before and following the bolus administration of intravenous contrast. CONTRAST:  165mL OMNIPAQUE IOHEXOL 300 MG/ML  SOLN COMPARISON:  12/23/2018 FINDINGS: Lower chest: No acute findings identified within the imaged portions of the lower chest. Hepatobiliary: Unchanged appearance of liver cysts. The largest is in the lateral segment of left lobe measuring 3.1 cm. Gallbladder appears normal. No biliary dilatation. Pancreas: No main duct dilatation, inflammation or mass. Fat density lesion within the  junction between body and tail of pancreas is again noted compatible with a intrapancreatic lipoma. This measures 1.1 cm, image  35/11.Small cystic lesion within the distal tail of pancreas measures 5 mm and is stable from previous exam. Spleen: Normal in size without focal abnormality. Adrenals/Urinary Tract: Stable 8 mm low-density nodule in left lobe of thyroid gland. Normal appearance of the right adrenal gland. The ablation zone defect within the anterolateral cortex of the mid to lower pole of left kidney is again noted, image 44/6. On today's exam this measures 1.6 x 0.9 cm, image 44/6. Previously 1.9 x 1.0 cm. The along the inferior margin of the ablation zone there is a small focal area of arterial phase enhancement measuring 3 mm, image 48/9. This is new when compared with 12/23/2018. Again seen are small scattered subcentimeter low-density cortical lesions which are too small to reliably characterize. The largest arises from the medial cortex of the inferior pole of right kidney measuring 8 mm, image 48/11. Unchanged from previous exam. No hydronephrosis identified bilaterally. Stomach/Bowel: Stomach is within normal limits. Appendix not visualized. No evidence of bowel wall thickening, distention, or inflammatory changes. Vascular/Lymphatic: Aortic atherosclerosis. No aneurysm. No abdominal adenopathy. Other: No free fluid or fluid collections. Musculoskeletal: No suspicious bone abnormality. IMPRESSION: 1. The cryo ablation zone defect within the anterolateral cortex of the mid to lower pole of left kidney. Continues to decrease in overall size. A small focal area of arterial phase enhancement along the inferior margin of the ablation zone measures 3 mm. This is new when compared with 12/23/2018. Suggest more frequent interval surveillance to assess for any temporal change in the appearance of this new finding. 2. Aortic atherosclerosis. Aortic Atherosclerosis (ICD10-I70.0). Electronically Signed   By:  Kerby Moors M.D.   On: 12/31/2019 11:57    Labs:  CBC: Recent Labs    10/26/19 1013  WBC 5.5  HGB 11.3*  HCT 33.5*  PLT 196    COAGS: No results for input(s): INR, APTT in the last 8760 hours.  BMP: Recent Labs    10/26/19 1013 12/31/19 1012  NA 139  --   K 4.5  --   CL 104  --   CO2 25  --   GLUCOSE 96  --   BUN 25  --   CALCIUM 9.6  --   CREATININE 1.07 1.00  GFRNONAA 61  --   GFRAA 71  --     LIVER FUNCTION TESTS: Recent Labs    10/26/19 1013  BILITOT 0.5  AST 20  ALT 21  ALKPHOS 84  PROT 7.3  ALBUMIN 4.0     Assessment and Plan:  Spoke with Dale [name] by phone and reviewed findings from a recent CT of the abdomen dated 12/31/2019.  This demonstrates a new tiny area of focal arterial enhancement measuring approximately 3 mm along the inferior margin of prior ablation of the left kidney.  Otherwise slightly contracted ablation defect.  No new renal lesions identified.  I told Dale [name] that I would like to follow the new small area of enhancement by CT.  Given his age and overall fairly frail condition, it would be preferable not to have to retreat him with any additional ablation.  This is a very small focus of enhancement and can be followed for growth/persistence.  I recommended another follow-up CT in 1 year.  Electronically Signed: Azzie Roup 01/04/2020, 10:08 AM     I spent a total of 10 Minutes in remote  clinical consultation, greater than 50% of which was counseling/coordinating care post ablation of a left renal carcinoma.  Visit type: Audio only (telephone). Audio (no video) only due to patient's lack of internet/smartphone capability. Alternative for in-person consultation at [facility], [address], [place], [place]. This visit type was conducted due to national recommendations for restrictions regarding the COVID-19 Pandemic (e.g. social distancing).  This format is felt to be most appropriate for this patient at  this time.  All issues noted in this document were discussed and addressed.

## 2020-01-11 ENCOUNTER — Ambulatory Visit (INDEPENDENT_AMBULATORY_CARE_PROVIDER_SITE_OTHER): Payer: Medicare Other | Admitting: Urology

## 2020-01-11 ENCOUNTER — Encounter: Payer: Self-pay | Admitting: Urology

## 2020-01-11 ENCOUNTER — Other Ambulatory Visit: Payer: Self-pay

## 2020-01-11 VITALS — BP 180/72 | HR 66

## 2020-01-11 DIAGNOSIS — I25119 Atherosclerotic heart disease of native coronary artery with unspecified angina pectoris: Secondary | ICD-10-CM | POA: Diagnosis not present

## 2020-01-11 DIAGNOSIS — R351 Nocturia: Secondary | ICD-10-CM | POA: Diagnosis not present

## 2020-01-11 DIAGNOSIS — N401 Enlarged prostate with lower urinary tract symptoms: Secondary | ICD-10-CM

## 2020-01-11 DIAGNOSIS — N2889 Other specified disorders of kidney and ureter: Secondary | ICD-10-CM

## 2020-01-11 DIAGNOSIS — N200 Calculus of kidney: Secondary | ICD-10-CM

## 2020-01-11 LAB — URINALYSIS, ROUTINE W REFLEX MICROSCOPIC
Bilirubin, UA: NEGATIVE
Glucose, UA: NEGATIVE
Ketones, UA: NEGATIVE
Leukocytes,UA: NEGATIVE
Nitrite, UA: NEGATIVE
Protein,UA: NEGATIVE
RBC, UA: NEGATIVE
Specific Gravity, UA: 1.02 (ref 1.005–1.030)
Urobilinogen, Ur: 0.2 mg/dL (ref 0.2–1.0)
pH, UA: 5 (ref 5.0–7.5)

## 2020-01-11 MED ORDER — ALFUZOSIN HCL ER 10 MG PO TB24: 10.0000 mg | ORAL_TABLET | Freq: Every day | ORAL | 11 refills | Status: DC

## 2020-01-11 NOTE — Progress Notes (Signed)
H&P  Chief Complaint: BPH w/ LUTS  History of Present Illness:  9.7.2021: Pt reports that for the last month he has had the sensation that he is emptying incompletely and notes a weak FOS.  Recent CT for f/u of cryo of a renal mass revealed small solitary renal stone bilaterally but no ureteral stones. He saw Dr Jomarie Longs  For current LUTS and was put on doxycycline w/o improvement. No gross hematuria/pain.  IPSS Questionnaire (AUA-7): Over the past month.   1)  How often have you had a sensation of not emptying your bladder completely after you finish urinating?  4 - More than half the time  2)  How often have you had to urinate again less than two hours after you finished urinating? 3 - About half the time  3)  How often have you found you stopped and started again several times when you urinated?  2 - Less than half the time  4) How difficult have you found it to postpone urination?  0 - Not at all  5) How often have you had a weak urinary stream?  4 - More than half the time  6) How often have you had to push or strain to begin urination?  2 - Less than half the time  7) How many times did you most typically get up to urinate from the time you went to bed until the time you got up in the morning?  2 - 2 times  Total score:  0-7 mildly symptomatic   8-19 moderately symptomatic   20-35 severely symptomatic     (below copied from [place] records): I have ureteral stones (surgery). HPI: Dale [name] is a 86 year-old male established patient who is here for ureteral calculi after a surgical intervention.  The problem is on the left side. He has had ureteroscopyfor treatment of his ureteral calculi. Patient denies stent, eswl, and percutaneous lithotomy. This procedure was done 06/16/2017.   2.11.2019: He underwent cystoscopy, left retrograde ureteral pyelogram, left ureteroscopic stone extraction, stent placement. Urothelial appearance of the left kidney and ureter were normal.   4.16.2019:  No gross hematuria or flank pain since his last visit. Recent ultrasound revealed moderate left hydronephrosis.   10.15.2019: No blood in urine   10.6.2020: No blood per urine, kidney pain, or difficulties urinating since last visit.   CC/HPI: I have kidney cancer.  Jan, 2017--saw Dr. Tresa Moore for follow-up of an enhancing 14 mm left renal mass. This was originally identified in December 2015 on a CT performed for hematuria. At that time, it was measured at 10 mm, although repeat measurement at the time of the second/follow-up CT revealed it was 12 mm. It was recommended that the patient proceed with surveillance of this lesion, as he was 12 and had comorbidities.   Followup CT scan revealed the lesion to be about 19 mm in size.   Because his lesion was enlarging, he was referred for possible cryoablation. This was performed 1.12.2018 by Dr. Aletta Edouard. Pathology revealed clear cell adenocarcinoma, WHO grade 2.  Follow-up CT abdomen and pelvis on 5.29.2018 revealed expected posttreatment scarring in the ablation bed with no evidence of residual tumor.   1.23.2019: Persistent gross painless hematuria. No associated weight loss, flank pain, change in urinary pattern. CT revealed a left ureteral calculus and left ureteral inflammation.   2.11.2019: URS/stone extraction.   10.15.2019: June 2019 C revealed resolved lt hydronephrosis, involuted lt renal tumor.   10.6.2020: He was recently seen  by Dr Kathlene Cote -- CT in August showed no signs of recurrence.  CC: I have an enlarged prostate (follow-up). HPI: He is currently taking finasteride.     Past Medical History:  Diagnosis Date  . Allergic rhinitis   . Anal fissure   . Anginal pain ([place])   . Asthma   . Cancer ([place])    skin  . Chest pain   . CHF (congestive heart failure) ([place])   . Coronary heart disease    s/p stenting. cath in 01/2012 noncritical occlusion  . Dysrhythmia    1st degree heart block  . GERD (gastroesophageal reflux  disease)   . Glaucoma   . Hiatal hernia   . Hyperlipidemia   . Hypertension   . Hypothyroidism   . Idiopathic thrombocytopenic purpura ([place]) 2002  . Macular degeneration   . Nephrolithiasis   . PUD (peptic ulcer disease)    remote  . Sarcoidosis    pulmonary  . Schatzki's ring     Past Surgical History:  Procedure Laterality Date  . cardiac stents    . COLONOSCOPY  10/30/2006   Normal rectum, sigmoid diverticula.Remainder of colonic mucosa appeared normal.  . CORONARY ANGIOPLASTY WITH STENT PLACEMENT     about 10 years ago per pt (around 2007)  . CYSTOSCOPY WITH RETROGRADE PYELOGRAM, URETEROSCOPY AND STENT PLACEMENT Left 06/16/2017   Procedure: CYSTOSCOPY WITH RETROGRADE PYELOGRAM, URETEROSCOPY,STONE EXTRACTION  AND STENT PLACEMENT;  Surgeon: Franchot Gallo, MD;  Location: [facility];  Service: Urology;  Laterality: Left;  . ESOPHAGOGASTRODUODENOSCOPY  06/19/2004   Two esophageal rings and esophageal web as described above.  All of these were disrupted by passing 56-French Venia Minks dilator/ Candida esophagitis,which appears to be incidental given history of   antibiotic use, but nevertheless will be treated.  . ESOPHAGOGASTRODUODENOSCOPY  10/30/2006   Distal tandem esophageal ring status post dilation disruption as  described above.  Otherwise normal esophagus/  Small hiatal hernia otherwise normal stomach, D1 and D2  . ESOPHAGOGASTRODUODENOSCOPY N/A 03/22/2015   Dr.Rourk- noncritical schatzki's ring and hiatal hernia-o/w normal EGD.   Marland Kitchen ESOPHAGOGASTRODUODENOSCOPY (EGD) WITH ESOPHAGEAL DILATION  03/04/2012   RMR- schatzki's ring, hiatal hernia, polypoid gastric mucosa, bx= minimally active gastritis.  Marland Kitchen HOLMIUM LASER APPLICATION Left 4/40/3474   Procedure: HOLMIUM LASER APPLICATION;  Surgeon: Franchot Gallo, MD;  Location: [facility];  Service: Urology;  Laterality: Left;  . IR GENERIC HISTORICAL  03/06/2016   IR RADIOLOGIST EVAL & MGMT 03/06/2016 Aletta Edouard, MD [facility] INTERV RAD    . IR GENERIC HISTORICAL  06/18/2016   IR RADIOLOGIST EVAL & MGMT 06/18/2016 Aletta Edouard, MD [facility] INTERV RAD  . IR RADIOLOGIST EVAL & MGMT  10/01/2016  . IR RADIOLOGIST EVAL & MGMT  10/15/2017  . IR RADIOLOGIST EVAL & MGMT  12/24/2018  . IR RADIOLOGIST EVAL & MGMT  01/04/2020  . LEFT HEART CATH N/A 02/02/2012   Procedure: LEFT HEART CATH;  Surgeon: Lorretta Harp, MD;  Location: [facility] CATH [facility];  Service: Cardiovascular;  Laterality: N/A;  . MEDIASTINOSCOPY     for dx sarcoid  . RADIOLOGY WITH ANESTHESIA Left 05/17/2016   Procedure: left renal ablation;  Surgeon: Aletta Edouard, MD;  Location: [facility];  Service: Radiology;  Laterality: Left;    Home Medications:  Allergies as of 01/11/2020      Reactions   Azithromycin Other (See Comments)   Sore mouth and fever blisters around mouth, sores in nose area as well   Doxazosin Shortness Of Breath   Acetaminophen  Other (See Comments)   REACTION: UNKNOWN REACTION   Atenolol Other (See Comments)   REACTION: UNKNOWN REACTION   Hydrocodone Nausea And Vomiting   Hydrocodone-acetaminophen Nausea Only   Levofloxacin Other (See Comments)   Caused stomach problems.   Morphine Other (See Comments)   "made me crazy"   Penicillins Nausea And Vomiting, Other (See Comments)   Has patient had a PCN reaction causing immediate rash, facial/tongue/throat swelling, SOB or lightheadedness with hypotension: No Has patient had a PCN reaction causing severe rash involving mucus membranes or skin necrosis: No Has patient had a PCN reaction that required hospitalization No Has patient had a PCN reaction occurring within the last 10 years: No If all of the above answers are "NO", then may proceed with Cephalosporin use.   Sulfonamide Derivatives Nausea And Vomiting      Medication List       Accurate as of January 11, 2020  1:02 PM. If you have any questions, ask your nurse or doctor.        aspirin 81 MG tablet Take 81 mg by mouth 3 (three) times a  week.   atorvastatin 40 MG tablet Commonly known as: LIPITOR TAKE 1 TABLET BY MOUTH IN  THE EVENING   brimonidine 0.2 % ophthalmic solution Commonly known as: ALPHAGAN Place 1 drop into both eyes 2 (two) times daily.   clopidogrel 75 MG tablet Commonly known as: PLAVIX Take 1 tablet (75 mg total) by mouth daily.   dorzolamide 2 % ophthalmic solution Commonly known as: TRUSOPT Place 1 drop into both eyes 2 (two) times daily.   furosemide 40 MG tablet Commonly known as: LASIX TAKE 1 TABLET(40 MG) BY MOUTH DAILY- take 20 mg additional after 4:00 pm if continued swelling as needed.   guaiFENesin 600 MG 12 hr tablet Commonly known as: MUCINEX Take 1,200 mg by mouth daily.   isosorbide mononitrate 60 MG 24 hr tablet Commonly known as: IMDUR TAKE 1.5 TABLETS (90MG ) BY MOUTH IN  THE MORNING AND ONE-HALF TABLET (30MG ) BY MOUTH AT NIGHT.   latanoprost 0.005 % ophthalmic solution Commonly known as: XALATAN Place 1 drop into both eyes at bedtime.   levothyroxine 100 MCG tablet Commonly known as: SYNTHROID TAKE 1 TABLET BY MOUTH  DAILY   losartan 100 MG tablet Commonly known as: COZAAR TAKE 1 TABLET BY MOUTH  DAILY   metoprolol tartrate 25 MG tablet Commonly known as: LOPRESSOR Take 1 tablet (25 mg) in the morning, and 0.5 tablet (12.5 mg) in the evening.   mometasone 0.1 % ointment Commonly known as: ELOCON Apply 1 application topically daily.   multivitamin tablet Take 1 tablet by mouth every evening.   nitroGLYCERIN 0.4 MG SL tablet Commonly known as: NITROSTAT Place 1 tablet (0.4 mg total) under the tongue every 5 (five) minutes as needed. For chest pain   pantoprazole 40 MG tablet Commonly known as: PROTONIX TAKE 1 TABLET BY MOUTH  DAILY BEFORE BREAKFAST   potassium chloride SA 20 MEQ tablet Commonly known as: KLOR-CON Take 1 tablet (20 mEq total) by mouth daily.   PreserVision/Lutein Caps Take 1 capsule by mouth 2 (two) times daily.   timolol 0.5 %  ophthalmic solution Commonly known as: TIMOPTIC Place 1 drop into both eyes daily.   triamcinolone cream 0.1 % Commonly known as: KENALOG Apply 1 application topically 2 (two) times daily as needed (for irritation).   vitamin C 500 MG tablet Commonly known as: ASCORBIC ACID Take 500 mg by mouth daily.  vitamin E 200 UNIT capsule Take 200 Units by mouth daily at 12 noon.       Allergies:  Allergies  Allergen Reactions  . Azithromycin Other (See Comments)    Sore mouth and fever blisters around mouth, sores in nose area as well  . Doxazosin Shortness Of Breath  . Acetaminophen Other (See Comments)    REACTION: UNKNOWN REACTION  . Atenolol Other (See Comments)    REACTION: UNKNOWN REACTION  . Hydrocodone Nausea And Vomiting  . Hydrocodone-Acetaminophen Nausea Only  . Levofloxacin Other (See Comments)    Caused stomach problems.  . Morphine Other (See Comments)    "made me crazy"  . Penicillins Nausea And Vomiting and Other (See Comments)    Has patient had a PCN reaction causing immediate rash, facial/tongue/throat swelling, SOB or lightheadedness with hypotension: No Has patient had a PCN reaction causing severe rash involving mucus membranes or skin necrosis: No Has patient had a PCN reaction that required hospitalization No Has patient had a PCN reaction occurring within the last 10 years: No If all of the above answers are "NO", then may proceed with Cephalosporin use.   . Sulfonamide Derivatives Nausea And Vomiting    Family History  Problem Relation Age of Onset  . Heart disease Father        deceased age 29  . Stroke Mother   . Alzheimer's disease Mother   . Heart attack Brother        deceased age 34  . Cancer Other        niece  . Colon cancer Neg Hx     Social History:  reports that he quit smoking about 49 years ago. His smoking use included cigarettes and cigars. He has a 0.20 pack-year smoking history. He has never used smokeless tobacco. He reports  that he does not drink alcohol and does not use drugs.  ROS: A complete review of systems was performed.  All systems are negative except for pertinent findings as noted.  Physical Exam:  Vital signs in last 24 hours: There were no vitals taken for this visit. Constitutional:  Alert and oriented, No acute distress Cardiovascular: Regular rate  Respiratory: Normal respiratory effort Neurologic: Grossly intact, no focal deficits Psychiatric: Normal mood and affect  I have reviewed prior pt notes  I have reviewed notes from referring/previous physicians ( Dr Kathlene Cote)  I have reviewed urinalysis results  I have independently reviewed prior imaging    Impression/Assessment:  1. H/O renal mass s/p cryo w/ 3 mm new nodule--followup planned  2. New onset LUTS w/ known BPH  Plan:  1. Pt started on alfuzosin and advised to discontinue if experiencing significant SEs.  2. F/U in 6 weeks for symptom recheck.  CC: Dr. Redmond School.

## 2020-01-11 NOTE — Progress Notes (Signed)
Urological Symptom Review  Patient is experiencing the following symptoms: Frequent urination Get up at night to urinate Have to strain to urinate   Review of Systems  Gastrointestinal (upper)  : Negative for upper GI symptoms  Gastrointestinal (lower) : Negative for lower GI symptoms  Constitutional : Negative for symptoms  Skin: Negative for skin symptoms  Eyes: Negative for eye symptoms  Ear/Nose/Throat : Negative for Ear/Nose/Throat symptoms  Hematologic/Lymphatic: Easy bruising  Cardiovascular : Negative for cardiovascular symptoms  Respiratory : Negative for respiratory symptoms  Endocrine: Negative for endocrine symptoms  Musculoskeletal: Negative for musculoskeletal symptoms  Neurological: Negative for neurological symptoms  Psychologic: Negative for psychiatric symptoms

## 2020-01-12 ENCOUNTER — Emergency Department (HOSPITAL_COMMUNITY): Payer: Medicare Other

## 2020-01-12 ENCOUNTER — Other Ambulatory Visit: Payer: Self-pay

## 2020-01-12 ENCOUNTER — Encounter (HOSPITAL_COMMUNITY): Payer: Self-pay | Admitting: *Deleted

## 2020-01-12 ENCOUNTER — Emergency Department (HOSPITAL_COMMUNITY)
Admission: EM | Admit: 2020-01-12 | Discharge: 2020-01-12 | Disposition: A | Discharge status: Home / Self Care | Payer: Medicare Other | Attending: Emergency Medicine | Admitting: Emergency Medicine

## 2020-01-12 DIAGNOSIS — J45909 Unspecified asthma, uncomplicated: Secondary | ICD-10-CM | POA: Insufficient documentation

## 2020-01-12 DIAGNOSIS — Z85828 Personal history of other malignant neoplasm of skin: Secondary | ICD-10-CM | POA: Insufficient documentation

## 2020-01-12 DIAGNOSIS — Z87891 Personal history of nicotine dependence: Secondary | ICD-10-CM | POA: Diagnosis not present

## 2020-01-12 DIAGNOSIS — R55 Syncope and collapse: Secondary | ICD-10-CM

## 2020-01-12 DIAGNOSIS — I509 Heart failure, unspecified: Secondary | ICD-10-CM | POA: Insufficient documentation

## 2020-01-12 DIAGNOSIS — Z79899 Other long term (current) drug therapy: Secondary | ICD-10-CM | POA: Insufficient documentation

## 2020-01-12 DIAGNOSIS — E039 Hypothyroidism, unspecified: Secondary | ICD-10-CM | POA: Insufficient documentation

## 2020-01-12 DIAGNOSIS — R4020 Unspecified coma: Secondary | ICD-10-CM | POA: Diagnosis not present

## 2020-01-12 DIAGNOSIS — Z7901 Long term (current) use of anticoagulants: Secondary | ICD-10-CM | POA: Diagnosis not present

## 2020-01-12 DIAGNOSIS — Z955 Presence of coronary angioplasty implant and graft: Secondary | ICD-10-CM | POA: Insufficient documentation

## 2020-01-12 DIAGNOSIS — Z20822 Contact with and (suspected) exposure to covid-19: Secondary | ICD-10-CM | POA: Insufficient documentation

## 2020-01-12 DIAGNOSIS — I110 Hypertensive heart disease with heart failure: Secondary | ICD-10-CM | POA: Insufficient documentation

## 2020-01-12 DIAGNOSIS — R000 Tachycardia, unspecified: Secondary | ICD-10-CM | POA: Diagnosis not present

## 2020-01-12 DIAGNOSIS — I2510 Atherosclerotic heart disease of native coronary artery without angina pectoris: Secondary | ICD-10-CM | POA: Diagnosis not present

## 2020-01-12 DIAGNOSIS — R0902 Hypoxemia: Secondary | ICD-10-CM | POA: Diagnosis not present

## 2020-01-12 DIAGNOSIS — D869 Sarcoidosis, unspecified: Secondary | ICD-10-CM | POA: Diagnosis not present

## 2020-01-12 DIAGNOSIS — I4891 Unspecified atrial fibrillation: Secondary | ICD-10-CM | POA: Diagnosis not present

## 2020-01-12 LAB — CBC WITH DIFFERENTIAL/PLATELET
Abs Immature Granulocytes: 0.03 10*3/uL (ref 0.00–0.07)
Basophils Absolute: 0 10*3/uL (ref 0.0–0.1)
Basophils Relative: 0 %
Eosinophils Absolute: 0.3 10*3/uL (ref 0.0–0.5)
Eosinophils Relative: 3 %
HCT: 31.7 % — ABNORMAL LOW (ref 39.0–52.0)
Hemoglobin: 10.2 g/dL — ABNORMAL LOW (ref 13.0–17.0)
Immature Granulocytes: 0 %
Lymphocytes Relative: 13 %
Lymphs Abs: 1.3 10*3/uL (ref 0.7–4.0)
MCH: 36 pg — ABNORMAL HIGH (ref 26.0–34.0)
MCHC: 32.2 g/dL (ref 30.0–36.0)
MCV: 112 fL — ABNORMAL HIGH (ref 80.0–100.0)
Monocytes Absolute: 1.2 10*3/uL — ABNORMAL HIGH (ref 0.1–1.0)
Monocytes Relative: 12 %
Neutro Abs: 7.1 10*3/uL (ref 1.7–7.7)
Neutrophils Relative %: 72 %
Platelets: 146 10*3/uL — ABNORMAL LOW (ref 150–400)
RBC: 2.83 MIL/uL — ABNORMAL LOW (ref 4.22–5.81)
RDW: 13.4 % (ref 11.5–15.5)
WBC: 10 10*3/uL (ref 4.0–10.5)
nRBC: 0 % (ref 0.0–0.2)

## 2020-01-12 LAB — BASIC METABOLIC PANEL
Anion gap: 7 (ref 5–15)
BUN: 29 mg/dL — ABNORMAL HIGH (ref 8–23)
CO2: 24 mmol/L (ref 22–32)
Calcium: 8.7 mg/dL — ABNORMAL LOW (ref 8.9–10.3)
Chloride: 105 mmol/L (ref 98–111)
Creatinine, Ser: 0.95 mg/dL (ref 0.61–1.24)
GFR calc Af Amer: >60 mL/min (ref >60)
GFR calc non Af Amer: >60 mL/min (ref >60)
Glucose, Bld: 107 mg/dL — ABNORMAL HIGH (ref 70–99)
Potassium: 4.5 mmol/L (ref 3.5–5.1)
Sodium: 136 mmol/L (ref 135–145)

## 2020-01-12 LAB — SARS CORONAVIRUS 2 BY RT PCR (HOSPITAL ORDER, PERFORMED IN ~~LOC~~ HOSPITAL LAB): SARS Coronavirus 2: NEGATIVE

## 2020-01-12 LAB — TROPONIN I (HIGH SENSITIVITY)
Troponin I (High Sensitivity): 3 ng/L (ref <18)
Troponin I (High Sensitivity): 3 ng/L (ref <18)

## 2020-01-12 LAB — CBG MONITORING, ED: Glucose-Capillary: 87 mg/dL (ref 70–99)

## 2020-01-12 MED ORDER — ONDANSETRON HCL 4 MG/2ML IJ SOLN
4.0000 mg | Freq: Once | INTRAMUSCULAR | Status: AC
  Administered 2020-01-12: 4 mg via INTRAVENOUS
  Filled 2020-01-12: qty 2

## 2020-01-12 NOTE — ED Notes (Signed)
ED Provider at bedside. 

## 2020-01-12 NOTE — ED Provider Notes (Signed)
[facility] EMERGENCY DEPARTMENT Provider Note   CSN: [id number] Arrival date & time: 01/12/20  1252     History Chief Complaint  Patient presents with  . Loss of Consciousness    Dale [name] is a 86 y.o. male who presents emergency department with chief complaint of syncope.  The patient has a past medical history of skin cancer, CHF, first-degree heart block, hypertension, hyperlipidemia, hypothyroidism, ITP, pulmonary sarcoidosis.  The patient states that this morning he took his first dose of a new medication (alfuzosin ER 10 mg).  He took the medication at 10 AM.  This is the first time he has taken the medication.  He states that his physician put him on it after he has been complaining of urinary frequency.  Patient states that about an hour later he was waiting for the literature provided by his physician at yesterday's visit when he began feeling sweaty, nauseous, weak and called out for his wife.  He states that he was seated in a chair and he began to lose consciousness notes.  His wife was able to hold him up in the chair until he regained consciousness and he did not fall or injure himself.  The patient states that he has never lost consciousness before.  He believes this is due to the medication that he took.  EMS reports that his initial blood pressure was 89/65 with a CBG of 112.  He was given a 400 mm bolus of fluids prior to arrival.  Patient continues to feel mildly nauseous.  He denies melena, hematochezia, chest pain, shortness of breath, unilateral leg swelling.  Patient is anticoagulated on Plavix.  HPI     Past Medical History:  Diagnosis Date  . Allergic rhinitis   . Anal fissure   . Anginal pain ([place])   . Asthma   . Cancer ([place])    skin  . Chest pain   . CHF (congestive heart failure) ([place])   . Coronary heart disease    s/p stenting. cath in 01/2012 noncritical occlusion  . Dysrhythmia    1st degree heart block  . GERD (gastroesophageal reflux disease)     . Glaucoma   . Hiatal hernia   . Hyperlipidemia   . Hypertension   . Hypothyroidism   . Idiopathic thrombocytopenic purpura ([place]) 2002  . Macular degeneration   . Nephrolithiasis   . PUD (peptic ulcer disease)    remote  . Sarcoidosis    pulmonary  . Schatzki's ring     Patient Active Problem List   Diagnosis Date Noted  . Anemia 05/21/2017  . Weight loss of more than 10% body weight 01/27/2017  . Sinusitis 01/02/2017  . Left renal mass 05/17/2016  . Hiatal hernia   . Schatzki's ring   . Odynophagia 03/03/2015  . Easy bruisability 09/26/2014  . Hyperlipidemia LDL goal <70 06/28/2014  . First degree AV block 10/08/2013  . Chest pain 02/02/2012  . CAD, RCA stent, RCA new DES July 2009, Cath [facility] 2011, 02/02/12 02/02/2012  . Unstable angina, cath showed patent stents 02/02/12 02/02/2012  . ITP (idiopathic thrombocytopenic purpura) 2002 02/02/2012  . BPH (benign prostatic hyperplasia) 02/02/2012  . Chest pain 08/15/2011  . Macular degeneration 08/15/2011  . History of ITP 08/15/2011  . ABDOMINAL PAIN 06/28/2010  . DIARRHEA 06/21/2010  . GERD 01/19/2009  . HEMATOCHEZIA 01/19/2009  . DYSPHAGIA UNSPECIFIED 01/19/2009  . Hypothyroidism 01/18/2009  . HYPERCHOLESTEROLEMIA 01/18/2009  . GLAUCOMA 01/18/2009  . Essential hypertension 01/18/2009  .  CHF 01/18/2009  . SCHATZKI'S RING 01/18/2009  . ANAL FISSURE 01/18/2009  . NEPHROLITHIASIS 01/18/2009  . PUD, HX OF 01/18/2009  . Sarcoidosis 08/27/2007  . Coronary atherosclerosis 08/27/2007  . Seasonal and perennial allergic rhinitis 08/27/2007  . Allergic asthma, mild intermittent, uncomplicated 62/83/1517    Past Surgical History:  Procedure Laterality Date  . cardiac stents    . COLONOSCOPY  10/30/2006   Normal rectum, sigmoid diverticula.Remainder of colonic mucosa appeared normal.  . CORONARY ANGIOPLASTY WITH STENT PLACEMENT     about 10 years ago per pt (around 2007)  . CYSTOSCOPY WITH RETROGRADE PYELOGRAM,  URETEROSCOPY AND STENT PLACEMENT Left 06/16/2017   Procedure: CYSTOSCOPY WITH RETROGRADE PYELOGRAM, URETEROSCOPY,STONE EXTRACTION  AND STENT PLACEMENT;  Surgeon: Franchot Gallo, MD;  Location: [facility] ORS;  Service: Urology;  Laterality: Left;  . ESOPHAGOGASTRODUODENOSCOPY  06/19/2004   Two esophageal rings and esophageal web as described above.  All of these were disrupted by passing 56-French Venia Minks dilator/ Candida esophagitis,which appears to be incidental given history of   antibiotic use, but nevertheless will be treated.  . ESOPHAGOGASTRODUODENOSCOPY  10/30/2006   Distal tandem esophageal ring status post dilation disruption as  described above.  Otherwise normal esophagus/  Small hiatal hernia otherwise normal stomach, D1 and D2  . ESOPHAGOGASTRODUODENOSCOPY N/A 03/22/2015   Dr.Rourk- noncritical schatzki's ring and hiatal hernia-o/w normal EGD.   Marland Kitchen ESOPHAGOGASTRODUODENOSCOPY (EGD) WITH ESOPHAGEAL DILATION  03/04/2012   RMR- schatzki's ring, hiatal hernia, polypoid gastric mucosa, bx= minimally active gastritis.  Marland Kitchen HOLMIUM LASER APPLICATION Left 10/20/735   Procedure: HOLMIUM LASER APPLICATION;  Surgeon: Franchot Gallo, MD;  Location: [facility];  Service: Urology;  Laterality: Left;  . IR GENERIC HISTORICAL  03/06/2016   IR RADIOLOGIST EVAL & MGMT 03/06/2016 Aletta Edouard, MD [facility] INTERV RAD  . IR GENERIC HISTORICAL  06/18/2016   IR RADIOLOGIST EVAL & MGMT 06/18/2016 Aletta Edouard, MD [facility] INTERV RAD  . IR RADIOLOGIST EVAL & MGMT  10/01/2016  . IR RADIOLOGIST EVAL & MGMT  10/15/2017  . IR RADIOLOGIST EVAL & MGMT  12/24/2018  . IR RADIOLOGIST EVAL & MGMT  01/04/2020  . LEFT HEART CATH N/A 02/02/2012   Procedure: LEFT HEART CATH;  Surgeon: Lorretta Harp, MD;  Location: [facility] CATH LAB;  Service: Cardiovascular;  Laterality: N/A;  . MEDIASTINOSCOPY     for dx sarcoid  . RADIOLOGY WITH ANESTHESIA Left 05/17/2016   Procedure: left renal ablation;  Surgeon: Aletta Edouard, MD;  Location: [facility]  ORS;  Service: Radiology;  Laterality: Left;       Family History  Problem Relation Age of Onset  . Heart disease Father        deceased age 77  . Stroke Mother   . Alzheimer's disease Mother   . Heart attack Brother        deceased age 23  . Cancer Other        niece  . Colon cancer Neg Hx     Social History   Tobacco Use  . Smoking status: Former Smoker    Packs/day: 0.10    Years: 2.00    Pack years: 0.20    Types: Cigarettes, Cigars    Quit date: 05/06/1970    Years since quitting: 49.7  . Smokeless tobacco: Never Used  Vaping Use  . Vaping Use: Never used  Substance Use Topics  . Alcohol use: No    Alcohol/week: 0.0 standard drinks  . Drug use: No    Home Medications Prior to Admission  medications   Medication Sig Start Date End Date Taking? Authorizing Provider  alfuzosin (UROXATRAL) 10 MG 24 hr tablet Take 1 tablet (10 mg total) by mouth daily with breakfast. 01/11/20   Franchot Gallo, MD  aspirin 81 MG tablet Take 81 mg by mouth 3 (three) times a week.     [provider]  atorvastatin (LIPITOR) 40 MG tablet TAKE 1 TABLET BY MOUTH IN  THE EVENING 06/30/19   Troy Sine, MD  brimonidine (ALPHAGAN) 0.2 % ophthalmic solution Place 1 drop into both eyes 2 (two) times daily.    [provider]  clopidogrel (PLAVIX) 75 MG tablet Take 1 tablet (75 mg total) by mouth daily. 06/30/19   Troy Sine, MD  dorzolamide (TRUSOPT) 2 % ophthalmic solution Place 1 drop into both eyes 2 (two) times daily.     [provider]  dorzolamide-timolol (COSOPT) 22.3-6.8 MG/ML ophthalmic solution 1 drop 2 (two) times daily. 12/31/19   [provider]  doxycycline (VIBRAMYCIN) 100 MG capsule Take 100 mg by mouth 2 (two) times daily. 01/03/20   [provider]  furosemide (LASIX) 40 MG tablet TAKE 1 TABLET(40 MG) BY MOUTH DAILY- take 20 mg additional after 4:00 pm if continued swelling as needed. 08/26/19   Troy Sine, MD  guaiFENesin  (MUCINEX) 600 MG 12 hr tablet Take 1,200 mg by mouth daily.     [provider]  isosorbide mononitrate (IMDUR) 60 MG 24 hr tablet TAKE 1.5 TABLETS (90MG ) BY MOUTH IN  THE MORNING AND ONE-HALF TABLET (30MG ) BY MOUTH AT NIGHT. 10/25/19   Troy Sine, MD  levothyroxine (SYNTHROID) 100 MCG tablet TAKE 1 TABLET BY MOUTH  DAILY 11/16/19   Troy Sine, MD  losartan (COZAAR) 100 MG tablet TAKE 1 TABLET BY MOUTH  DAILY 12/10/18   Troy Sine, MD  metoprolol tartrate (LOPRESSOR) 25 MG tablet Take 1 tablet (25 mg) in the morning, and 0.5 tablet (12.5 mg) in the evening. 07/29/19   Troy Sine, MD  mometasone (ELOCON) 0.1 % ointment Apply 1 application topically daily.     [provider]  Multiple Vitamin (MULTIVITAMIN) tablet Take 1 tablet by mouth every evening.     [provider]  Multiple Vitamins-Minerals (PRESERVISION/LUTEIN) CAPS Take 1 capsule by mouth 2 (two) times daily.     [provider]  nitroGLYCERIN (NITROSTAT) 0.4 MG SL tablet Place 1 tablet (0.4 mg total) under the tongue every 5 (five) minutes as needed. For chest pain 04/06/18   Leonie Man, MD  pantoprazole (PROTONIX) 40 MG tablet TAKE 1 TABLET BY MOUTH  DAILY BEFORE BREAKFAST 01/01/20   Annitta Needs, NP  potassium chloride SA (KLOR-CON) 20 MEQ tablet Take 1 tablet (20 mEq total) by mouth daily. 06/15/19   Troy Sine, MD  ROCKLATAN 0.02-0.005 % SOLN Apply to eye. 12/21/19   [provider]  timolol (TIMOPTIC) 0.5 % ophthalmic solution Place 1 drop into both eyes daily.     [provider]  triamcinolone (KENALOG) 0.1 % cream Apply 1 application topically 2 (two) times daily as needed (for irritation).     [provider]  vitamin C (ASCORBIC ACID) 500 MG tablet Take 500 mg by mouth daily.     [provider]  vitamin E 200 UNIT capsule Take 200 Units by mouth daily at 12 noon.     [provider]    Allergies    Azithromycin, Doxazosin,  Acetaminophen, Atenolol, Hydrocodone,  Hydrocodone-acetaminophen, Levofloxacin, Morphine, Penicillins, and Sulfonamide derivatives  Review of Systems   Review of Systems Ten systems reviewed and are negative for acute change, except as noted in the HPI.   Physical Exam Updated Vital Signs BP 131/72 (BP Location: Right Arm)   Pulse 76   Temp 98.2 F (36.8 C) (Oral)   Resp 19   Ht 5\' 8"  (1.727 m)   Wt 63.5 kg   SpO2 100%   BMI 21.29 kg/m   Physical Exam Physical Exam  Nursing note and vitals reviewed. Constitutional: He appears well-developed and well-nourished. No distress.  HENT:  Head: Normocephalic and atraumatic.  Eyes: Conjunctivae normal are normal. No scleral icterus.  Neck: Normal range of motion. Neck supple.  Cardiovascular: Normal rate, regular rhythm and normal heart sounds.   Pulmonary/Chest: Effort normal and breath sounds normal. No respiratory distress.  Abdominal: Soft. There is no tenderness.  Musculoskeletal: He exhibits no edema.  Neurological: He is alert.  Skin: Skin is warm and dry. He is not diaphoretic.  Psychiatric: His behavior is normal.    ED Results / Procedures / Treatments   Labs (all labs ordered are listed, but only abnormal results are displayed) Labs Reviewed  BASIC METABOLIC PANEL - Abnormal; Notable for the following components:      Result Value   Glucose, Bld 107 (*)    BUN 29 (*)    Calcium 8.7 (*)    All other components within normal limits  CBC WITH DIFFERENTIAL/PLATELET - Abnormal; Notable for the following components:   RBC 2.83 (*)    Hemoglobin 10.2 (*)    HCT 31.7 (*)    MCV 112.0 (*)    MCH 36.0 (*)    Platelets 146 (*)    Monocytes Absolute 1.2 (*)    All other components within normal limits  SARS CORONAVIRUS 2 BY RT PCR (HOSPITAL ORDER, [place] [facility])  URINALYSIS, ROUTINE W REFLEX MICROSCOPIC  CBG MONITORING, ED  TROPONIN I (HIGH SENSITIVITY)  TROPONIN I (HIGH SENSITIVITY)     EKG EKG Interpretation  Date/Time:  Wednesday January 12 2020 13:28:22 EDT Ventricular Rate:  53 PR Interval:  240 QRS Duration: 88 QT Interval:  434 QTC Calculation: 407 R Axis:   74 Text Interpretation: Sinus bradycardia with 1st degree A-V block Nonspecific ST abnormality No significant change since last tracing Confirmed by Lajean Saver [phone]) on 01/12/2020 3:50:06 PM   Radiology DG Chest 2 View  Result Date: 01/12/2020 CLINICAL DATA:  Syncope, asthma, sarcoidosis EXAM: CHEST - 2 VIEW COMPARISON:  01/01/2017 FINDINGS: Frontal and lateral views of the chest demonstrate an unremarkable cardiac silhouette. Stable scarring throughout the lungs without airspace disease, effusion, or pneumothorax. Eventration right hemidiaphragm unchanged. No acute bony abnormalities. IMPRESSION: 1. Stable scarring, no acute process. Electronically Signed   By: Randa Ngo M.D.   On: 01/12/2020 17:24    Procedures Procedures (including critical care time)  Medications Ordered in ED Medications  ondansetron (ZOFRAN) injection 4 mg (4 mg Intravenous Given 01/12/20 1359)    ED Course  I have reviewed the triage vital signs and the nursing notes.  Pertinent labs & imaging results that were available during my care of the patient were reviewed by me and considered in my medical decision making (see chart for details).    MDM Rules/Calculators/A&P                          [id number] VS: BP  131/72 (BP Location: Right Arm)   Pulse 76   Temp 98.2 F (36.8 C) (Oral)   Resp 19   Ht 5\' 8"  (1.727 m)   Wt 63.5 kg   SpO2 100%   BMI 21.29 kg/m   RK:YHCWCBJ is gathered by patient and emr. Previous records obtained and reviewed. DDX:The patient's complaint of syncope involves an extensive number of diagnostic and treatment options, and is a complaint that carries with it a high risk of complications, morbidity, and potential mortality. Given the large differential diagnosis, medical decision  making is of high complexity. The differential for syncope is extensive and includes, but is not limited to: arrythmia (Vtach, SVT, SSS, sinus arrest, AV block, bradycardia) aortic stenosis, AMI, HOCM, PE, atrial myxoma, pulmonary hypertension, orthostatic hypotension, (hypovolemia, drug effect, GB syndrome, micturition, cough, swall) carotid sinus sensitivity, Seizure, TIA/CVA, hypoglycemia,  Vertigo.  Labs: I ordered reviewed and interpreted labs which include CBC which shows mild macrocytic anemia this appears to be chronic and at baseline BMP with mildly elevated blood glucose, slightly elevated BUN but otherwise normal, these values are insignificant Troponin negative x2 CBG of 87 and Covid test negative  Imaging: I ordered and reviewed images which included 2 view chest x-ray. I independently visualized and interpreted all imaging. There are no acute, significant findings on today's images. EKG: Sinus bradycardia with first-degree AV block at a rate of 53 which is unchanged from previous Consults: MDM: Patient here with syncope effort taking an alpha 1 blocker prescribed by his urologist.  Although the patient does have a history of CHF and his older I have very low suspicion that there is another cause for his syncope.  This is very likely cause due to the alpha-1 receptor which is known for causing hypotension. Negative orthostatics. Patient disposition:discharge The patient appears reasonably screened and/or stabilized for discharge and I doubt any other medical condition or other [facility] requiring further screening, evaluation, or treatment in the ED at this time prior to discharge. I have discussed lab and/or imaging findings with the patient and answered all questions/concerns to the best of my ability.I have discussed return precautions and OP follow up.     Dale [name] was evaluated in Emergency Department on 01/12/2020 for the symptoms described in the history of present illness. He was  evaluated in the context of the global COVID-19 pandemic, which necessitated consideration that the patient might be at risk for infection with the SARS-CoV-2 virus that causes COVID-19. Institutional protocols and algorithms that pertain to the evaluation of patients at risk for COVID-19 are in a state of rapid change based on information released by regulatory bodies including the CDC and federal and state organizations. These policies and algorithms were followed during the patient's care in the ED.  Final Clinical Impression(s) / ED Diagnoses Final diagnoses:  Syncope, unspecified syncope type    Rx / DC Orders ED Discharge Orders    None       Margarita Mail, PA-C 01/12/20 2230    Lajean Saver, MD 01/15/20 [phone]

## 2020-01-12 NOTE — ED Triage Notes (Signed)
Pt brought in by [facility] for c/o syncopal episode; pt started a new medication today (alfuzosin ER 10mg ) and after taking the medication he passed out; pt has had 417ml bolus with ems with no significant change in pressure; BP with ems 89/65 and cbg 112; pt c/o some nausea

## 2020-01-12 NOTE — Discharge Instructions (Addendum)
Please STOP taking the medication (alfuzosin) immediately. You can contact Dr. [name] about other potential medications for your urination problems. Get help right away if you: Have a severe headache. Faint once or repeatedly. Have pain in your chest, abdomen, or back. Have a very fast or irregular heartbeat (palpitations). Have pain when you breathe. Are bleeding from your mouth or rectum, or you have black or tarry stool. Have a seizure. Are confused. Have trouble walking. Have severe weakness. Have vision problems.

## 2020-01-13 ENCOUNTER — Telehealth: Payer: Self-pay

## 2020-01-13 NOTE — Telephone Encounter (Signed)
Wife called to report pt started Alfuzosin as ordered from [place] on 9/7. Reports yesterday he had two syncopal episodes ( one requiring him to go to the ER) pt Dr. [name] last [place] note- wife was advised to have patient stop the medication due to significant side effects.  Wife verbalized understanding. Message sent to MD as well.,

## 2020-01-27 DIAGNOSIS — D0322 Melanoma in situ of left ear and external auricular canal: Secondary | ICD-10-CM | POA: Diagnosis not present

## 2020-01-27 DIAGNOSIS — L821 Other seborrheic keratosis: Secondary | ICD-10-CM | POA: Diagnosis not present

## 2020-01-27 DIAGNOSIS — L820 Inflamed seborrheic keratosis: Secondary | ICD-10-CM | POA: Diagnosis not present

## 2020-01-27 DIAGNOSIS — X32XXXD Exposure to sunlight, subsequent encounter: Secondary | ICD-10-CM | POA: Diagnosis not present

## 2020-01-27 DIAGNOSIS — L570 Actinic keratosis: Secondary | ICD-10-CM | POA: Diagnosis not present

## 2020-01-27 DIAGNOSIS — D225 Melanocytic nevi of trunk: Secondary | ICD-10-CM | POA: Diagnosis not present

## 2020-01-31 ENCOUNTER — Other Ambulatory Visit: Payer: Self-pay | Admitting: Cardiovascular Disease

## 2020-02-03 DIAGNOSIS — E7849 Other hyperlipidemia: Secondary | ICD-10-CM | POA: Diagnosis not present

## 2020-02-03 DIAGNOSIS — I10 Essential (primary) hypertension: Secondary | ICD-10-CM | POA: Diagnosis not present

## 2020-02-03 DIAGNOSIS — E063 Autoimmune thyroiditis: Secondary | ICD-10-CM | POA: Diagnosis not present

## 2020-02-03 DIAGNOSIS — I2510 Atherosclerotic heart disease of native coronary artery without angina pectoris: Secondary | ICD-10-CM | POA: Diagnosis not present

## 2020-02-10 DIAGNOSIS — D0322 Melanoma in situ of left ear and external auricular canal: Secondary | ICD-10-CM | POA: Diagnosis not present

## 2020-02-22 ENCOUNTER — Other Ambulatory Visit: Payer: Self-pay

## 2020-02-22 ENCOUNTER — Ambulatory Visit (INDEPENDENT_AMBULATORY_CARE_PROVIDER_SITE_OTHER): Payer: Medicare Other | Admitting: Urology

## 2020-02-22 ENCOUNTER — Encounter: Payer: Self-pay | Admitting: Urology

## 2020-02-22 VITALS — BP 156/64 | HR 67 | Temp 98.6°F | Ht 68.0 in | Wt 140.0 lb

## 2020-02-22 DIAGNOSIS — N200 Calculus of kidney: Secondary | ICD-10-CM | POA: Diagnosis not present

## 2020-02-22 DIAGNOSIS — N2889 Other specified disorders of kidney and ureter: Secondary | ICD-10-CM

## 2020-02-22 DIAGNOSIS — R351 Nocturia: Secondary | ICD-10-CM | POA: Diagnosis not present

## 2020-02-22 DIAGNOSIS — I25119 Atherosclerotic heart disease of native coronary artery with unspecified angina pectoris: Secondary | ICD-10-CM

## 2020-02-22 DIAGNOSIS — N401 Enlarged prostate with lower urinary tract symptoms: Secondary | ICD-10-CM | POA: Diagnosis not present

## 2020-02-22 LAB — URINALYSIS, ROUTINE W REFLEX MICROSCOPIC
Bilirubin, UA: NEGATIVE
Glucose, UA: NEGATIVE
Ketones, UA: NEGATIVE
Leukocytes,UA: NEGATIVE
Nitrite, UA: NEGATIVE
Protein,UA: NEGATIVE
RBC, UA: NEGATIVE
Specific Gravity, UA: 1.02 (ref 1.005–1.030)
Urobilinogen, Ur: 0.2 mg/dL (ref 0.2–1.0)
pH, UA: 6 (ref 5.0–7.5)

## 2020-02-22 NOTE — Progress Notes (Signed)
H&P  Chief Complaint: BPH w/ LUTS  History of Present Illness: This man comes in today for follow-up of BPH.  At his last visit a month ago he was placed on alfuzosin due to feeling of incomplete emptying.  Unfortunately, the day later, he presented to the emergency room with syncopal episodes.  He has been off of alfuzosin since that time.  He still complains of lack of proper emptying but overall his stream is good, he only has nocturia x1.  No recent gross hematuria or dysuria.  10.19.2021:   (below copied from [facility] records):  I have ureteral stones (surgery). HPI: Dale [name] is a 86 year-old male established patient who is here for ureteral calculi after a surgical intervention.  The problem is on the left side. He has had ureteroscopyfor treatment of his ureteral calculi. Patient denies stent, eswl, and percutaneous lithotomy. This procedure was done 06/16/2017.   2.11.2019: He underwent cystoscopy, left retrograde ureteral pyelogram, left ureteroscopic stone extraction, stent placement. Urothelial appearance of the left kidney and ureter were normal.   4.16.2019: No gross hematuria or flank pain since his last visit. Recent ultrasound revealed moderate left hydronephrosis.   CC/HPI: I have kidney cancer.  1.2017--saw Dr. Tresa Moore for follow-up of an enhancing 14 mm left renal mass. This was originally identified in December 2015 on a CT performed for hematuria. At that time, it was measured at 10 mm, although repeat measurement at the time of the second/follow-up CT revealed it was 12 mm. It was recommended that the patient proceed with surveillance of this lesion, as he was 7 and had comorbidities.   Followup CT scan revealed the lesion to be about 19 mm in size.  Because his lesion was enlarging, he was referred for possible cryoablation. This was performed   1.12.2018 by Dr. Aletta Edouard. Pathology revealed clear cell adenocarcinoma, WHO grade 2.  Follow-up CT abdomen and pelvis on  5.29.2018 revealed expected posttreatment scarring in the ablation bed with no evidence of residual tumor.   1.23.2019: Persistent gross painless hematuria. No associated weight loss, flank pain, change in urinary pattern. CT revealed a left ureteral calculus and left ureteral inflammation.   2.11.2019: URS/stone extraction. He underwent cystoscopy, left retrograde ureteral pyelogram, left ureteroscopic stone extraction, stent placement. Urothelial appearance of the left kidney and ureter were normal.   4.16.2019: No gross hematuria or flank pain since his last visit. Recent ultrasound revealed moderate left hydronephrosis.   10.15.2019: June 2019 C revealed resolved lt hydronephrosis, involuted lt renal tumor.   10.15.2019: No blood in urine   10.6.2020: No blood per urine, kidney pain, or difficulties urinating since last visit.   10.6.2020: He was recently seen by Dr Kathlene Cote -- CT in August showed no signs of recurrence.  CC: I have an enlarged prostate (follow-up). HPI: He is currently taking finasteride.   9.7.2021: Pt reports that for the last month he has had the sensation that he is emptying incompletely and notes a weak FOS.  Recent CT for f/u of cryo of a renal mass revealed small solitary renal stone bilaterally but no ureteral stones. He saw Dr Jomarie Longs  For current LUTS and was put on doxycycline w/o improvement. No gross hematuria/pain.  Past Medical History:  Diagnosis Date  . Allergic rhinitis   . Anal fissure   . Anginal pain ([place])   . Asthma   . Cancer ([place])    skin  . Chest pain   . CHF (congestive heart failure) ([place])   .  Coronary heart disease    s/p stenting. cath in 01/2012 noncritical occlusion  . Dysrhythmia    1st degree heart block  . GERD (gastroesophageal reflux disease)   . Glaucoma   . Hiatal hernia   . Hyperlipidemia   . Hypertension   . Hypothyroidism   . Idiopathic thrombocytopenic purpura ([place]) 2002  . Macular degeneration   .  Nephrolithiasis   . PUD (peptic ulcer disease)    remote  . Sarcoidosis    pulmonary  . Schatzki's ring     Past Surgical History:  Procedure Laterality Date  . cardiac stents    . COLONOSCOPY  10/30/2006   Normal rectum, sigmoid diverticula.Remainder of colonic mucosa appeared normal.  . CORONARY ANGIOPLASTY WITH STENT PLACEMENT     about 10 years ago per pt (around 2007)  . CYSTOSCOPY WITH RETROGRADE PYELOGRAM, URETEROSCOPY AND STENT PLACEMENT Left 06/16/2017   Procedure: CYSTOSCOPY WITH RETROGRADE PYELOGRAM, URETEROSCOPY,STONE EXTRACTION  AND STENT PLACEMENT;  Surgeon: Franchot Gallo, MD;  Location: [facility];  Service: Urology;  Laterality: Left;  . ESOPHAGOGASTRODUODENOSCOPY  06/19/2004   Two esophageal rings and esophageal web as described above.  All of these were disrupted by passing 56-French Venia Minks dilator/ Candida esophagitis,which appears to be incidental given history of   antibiotic use, but nevertheless will be treated.  . ESOPHAGOGASTRODUODENOSCOPY  10/30/2006   Distal tandem esophageal ring status post dilation disruption as  described above.  Otherwise normal esophagus/  Small hiatal hernia otherwise normal stomach, D1 and D2  . ESOPHAGOGASTRODUODENOSCOPY N/A 03/22/2015   Dr.Rourk- noncritical schatzki's ring and hiatal hernia-o/w normal EGD.   Marland Kitchen ESOPHAGOGASTRODUODENOSCOPY (EGD) WITH ESOPHAGEAL DILATION  03/04/2012   RMR- schatzki's ring, hiatal hernia, polypoid gastric mucosa, bx= minimally active gastritis.  Marland Kitchen HOLMIUM LASER APPLICATION Left 5/68/1275   Procedure: HOLMIUM LASER APPLICATION;  Surgeon: Franchot Gallo, MD;  Location: [facility];  Service: Urology;  Laterality: Left;  . IR GENERIC HISTORICAL  03/06/2016   IR RADIOLOGIST EVAL & MGMT 03/06/2016 Aletta Edouard, MD [facility] INTERV RAD  . IR GENERIC HISTORICAL  06/18/2016   IR RADIOLOGIST EVAL & MGMT 06/18/2016 Aletta Edouard, MD [facility] INTERV RAD  . IR RADIOLOGIST EVAL & MGMT  10/01/2016  . IR RADIOLOGIST EVAL &  MGMT  10/15/2017  . IR RADIOLOGIST EVAL & MGMT  12/24/2018  . IR RADIOLOGIST EVAL & MGMT  01/04/2020  . LEFT HEART CATH N/A 02/02/2012   Procedure: LEFT HEART CATH;  Surgeon: Lorretta Harp, MD;  Location: [facility] CATH LAB;  Service: Cardiovascular;  Laterality: N/A;  . MEDIASTINOSCOPY     for dx sarcoid  . RADIOLOGY WITH ANESTHESIA Left 05/17/2016   Procedure: left renal ablation;  Surgeon: Aletta Edouard, MD;  Location: [facility];  Service: Radiology;  Laterality: Left;    Home Medications:  Allergies as of 02/22/2020      Reactions   Azithromycin Other (See Comments)   Sore mouth and fever blisters around mouth, sores in nose area as well   Doxazosin Shortness Of Breath   Acetaminophen Other (See Comments)   REACTION: UNKNOWN REACTION   Atenolol Other (See Comments)   REACTION: UNKNOWN REACTION   Hydrocodone Nausea And Vomiting   Hydrocodone-acetaminophen Nausea Only   Levofloxacin Other (See Comments)   Caused stomach problems.   Morphine Other (See Comments)   "made me crazy"   Penicillins Nausea And Vomiting, Other (See Comments)   Has patient had a PCN reaction causing immediate rash, facial/tongue/throat swelling, SOB or lightheadedness with hypotension: No  Has patient had a PCN reaction causing severe rash involving mucus membranes or skin necrosis: No Has patient had a PCN reaction that required hospitalization No Has patient had a PCN reaction occurring within the last 10 years: No If all of the above answers are "NO", then may proceed with Cephalosporin use.   Sulfonamide Derivatives Nausea And Vomiting      Medication List       Accurate as of February 22, 2020 11:45 AM. If you have any questions, ask your nurse or doctor.        alfuzosin 10 MG 24 hr tablet Commonly known as: UROXATRAL Take 1 tablet (10 mg total) by mouth daily with breakfast.   aspirin 81 MG tablet Take 81 mg by mouth 3 (three) times a week.   atorvastatin 40 MG tablet Commonly known as:  LIPITOR TAKE 1 TABLET BY MOUTH IN  THE EVENING   brimonidine 0.2 % ophthalmic solution Commonly known as: ALPHAGAN Place 1 drop into both eyes 2 (two) times daily.   clopidogrel 75 MG tablet Commonly known as: PLAVIX Take 1 tablet (75 mg total) by mouth daily.   dorzolamide 2 % ophthalmic solution Commonly known as: TRUSOPT Place 1 drop into both eyes 2 (two) times daily.   dorzolamide-timolol 22.3-6.8 MG/ML ophthalmic solution Commonly known as: COSOPT 1 drop 2 (two) times daily.   doxycycline 100 MG capsule Commonly known as: VIBRAMYCIN Take 100 mg by mouth 2 (two) times daily.   furosemide 40 MG tablet Commonly known as: LASIX TAKE 1 TABLET(40 MG) BY MOUTH DAILY- take 20 mg additional after 4:00 pm if continued swelling as needed.   guaiFENesin 600 MG 12 hr tablet Commonly known as: MUCINEX Take 1,200 mg by mouth daily.   isosorbide mononitrate 60 MG 24 hr tablet Commonly known as: IMDUR TAKE 1.5 TABLETS (90MG ) BY MOUTH IN  THE MORNING AND ONE-HALF TABLET (30MG ) BY MOUTH AT NIGHT.   levothyroxine 100 MCG tablet Commonly known as: SYNTHROID TAKE 1 TABLET BY MOUTH  DAILY   losartan 100 MG tablet Commonly known as: COZAAR TAKE 1 TABLET BY MOUTH  DAILY   metoprolol tartrate 25 MG tablet Commonly known as: LOPRESSOR Take 1 tablet (25 mg) in the morning, and 0.5 tablet (12.5 mg) in the evening.   mometasone 0.1 % ointment Commonly known as: ELOCON Apply 1 application topically daily.   multivitamin tablet Take 1 tablet by mouth every evening.   nitroGLYCERIN 0.4 MG SL tablet Commonly known as: NITROSTAT Place 1 tablet (0.4 mg total) under the tongue every 5 (five) minutes as needed. For chest pain   pantoprazole 40 MG tablet Commonly known as: PROTONIX TAKE 1 TABLET BY MOUTH  DAILY BEFORE BREAKFAST   potassium chloride SA 20 MEQ tablet Commonly known as: KLOR-CON Take 1 tablet (20 mEq total) by mouth daily.   PreserVision/Lutein Caps Take 1 capsule by  mouth 2 (two) times daily.   Rocklatan 0.02-0.005 % Soln Generic drug: Netarsudil-Latanoprost Apply to eye.   timolol 0.5 % ophthalmic solution Commonly known as: TIMOPTIC Place 1 drop into both eyes daily.   triamcinolone cream 0.1 % Commonly known as: KENALOG Apply 1 application topically 2 (two) times daily as needed (for irritation).   vitamin C 500 MG tablet Commonly known as: ASCORBIC ACID Take 500 mg by mouth daily.   vitamin E 200 UNIT capsule Take 200 Units by mouth daily at 12 noon.       Allergies:  Allergies  Allergen Reactions  .  Azithromycin Other (See Comments)    Sore mouth and fever blisters around mouth, sores in nose area as well  . Doxazosin Shortness Of Breath  . Acetaminophen Other (See Comments)    REACTION: UNKNOWN REACTION  . Atenolol Other (See Comments)    REACTION: UNKNOWN REACTION  . Hydrocodone Nausea And Vomiting  . Hydrocodone-Acetaminophen Nausea Only  . Levofloxacin Other (See Comments)    Caused stomach problems.  . Morphine Other (See Comments)    "made me crazy"  . Penicillins Nausea And Vomiting and Other (See Comments)    Has patient had a PCN reaction causing immediate rash, facial/tongue/throat swelling, SOB or lightheadedness with hypotension: No Has patient had a PCN reaction causing severe rash involving mucus membranes or skin necrosis: No Has patient had a PCN reaction that required hospitalization No Has patient had a PCN reaction occurring within the last 10 years: No If all of the above answers are "NO", then may proceed with Cephalosporin use.   . Sulfonamide Derivatives Nausea And Vomiting    Family History  Problem Relation Age of Onset  . Heart disease Father        deceased age 46  . Stroke Mother   . Alzheimer's disease Mother   . Heart attack Brother        deceased age 24  . Cancer Other        niece  . Colon cancer Neg Hx     Social History:  reports that he quit smoking about 49 years ago. His  smoking use included cigarettes and cigars. He has a 0.20 pack-year smoking history. He has never used smokeless tobacco. He reports that he does not drink alcohol and does not use drugs.  ROS: A complete review of systems was performed.  All systems are negative except for pertinent findings as noted.  Physical Exam:  Vital signs in last 24 hours: There were no vitals taken for this visit. Constitutional:  Alert and oriented, No acute distress Cardiovascular: Regular rate  Respiratory: Normal respiratory effort Neurologic: Grossly intact, no focal deficits Psychiatric: Normal mood and affect  I have reviewed prior pt notes  I have reviewed notes from referring/previous physicians  I have reviewed urinalysis results  I have independently reviewed prior imaging  Impression/Assessment:  1.  BPH, feeling of incomplete emptying, bothersome but he did not tolerate alfuzosin and is now off of that due to syncopal episode  2.  History of stone, no evidence of recurrence  3.  Renal mass, status post cryoablation with 3 mm possible recurrence, will follow  Plan:  1.  I will recommend that he stay off alfuzosin.  Unfortunately, we do not have that many other treatment options.  On cystoscopy at the time of ureteroscopic stone extraction 2 years ago he had a nonobstructive prostate  2.  I will have him come back in 6 months with postvoid residual check

## 2020-02-22 NOTE — Progress Notes (Signed)
Urological Symptom Review  Patient is experiencing the following symptoms: Hard to postpone urination Trouble starting stream   Review of Systems  Gastrointestinal (upper)  : Negative for upper GI symptoms  Gastrointestinal (lower) : Negative for lower GI symptoms  Constitutional : Negative for symptoms  Skin: Negative for skin symptoms  Eyes: Negative for eye symptoms  Ear/Nose/Throat : Negative for Ear/Nose/Throat symptoms  Hematologic/Lymphatic: Easy bruising  Cardiovascular : Leg swelling  Respiratory : Negative for respiratory symptoms  Endocrine: Negative for endocrine symptoms  Musculoskeletal: Joint pain  Neurological: Negative for neurological symptoms  Psychologic: Negative for psychiatric symptoms

## 2020-02-25 DIAGNOSIS — Z23 Encounter for immunization: Secondary | ICD-10-CM | POA: Diagnosis not present

## 2020-02-28 DIAGNOSIS — D649 Anemia, unspecified: Secondary | ICD-10-CM | POA: Diagnosis not present

## 2020-02-28 DIAGNOSIS — E063 Autoimmune thyroiditis: Secondary | ICD-10-CM | POA: Diagnosis not present

## 2020-02-28 DIAGNOSIS — Z125 Encounter for screening for malignant neoplasm of prostate: Secondary | ICD-10-CM | POA: Diagnosis not present

## 2020-02-28 DIAGNOSIS — I700 Atherosclerosis of aorta: Secondary | ICD-10-CM | POA: Diagnosis not present

## 2020-02-28 DIAGNOSIS — I2510 Atherosclerotic heart disease of native coronary artery without angina pectoris: Secondary | ICD-10-CM | POA: Diagnosis not present

## 2020-02-28 DIAGNOSIS — Z79899 Other long term (current) drug therapy: Secondary | ICD-10-CM | POA: Diagnosis not present

## 2020-03-01 ENCOUNTER — Encounter: Payer: Self-pay | Admitting: Physician Assistant

## 2020-03-01 ENCOUNTER — Other Ambulatory Visit: Payer: Self-pay

## 2020-03-01 ENCOUNTER — Ambulatory Visit (INDEPENDENT_AMBULATORY_CARE_PROVIDER_SITE_OTHER): Payer: Medicare Other | Admitting: Physician Assistant

## 2020-03-01 VITALS — BP 154/66 | HR 60 | Ht 68.0 in | Wt 141.6 lb

## 2020-03-01 DIAGNOSIS — D860 Sarcoidosis of lung: Secondary | ICD-10-CM | POA: Diagnosis not present

## 2020-03-01 DIAGNOSIS — I2510 Atherosclerotic heart disease of native coronary artery without angina pectoris: Secondary | ICD-10-CM

## 2020-03-01 DIAGNOSIS — E785 Hyperlipidemia, unspecified: Secondary | ICD-10-CM

## 2020-03-01 DIAGNOSIS — E039 Hypothyroidism, unspecified: Secondary | ICD-10-CM

## 2020-03-01 DIAGNOSIS — I25119 Atherosclerotic heart disease of native coronary artery with unspecified angina pectoris: Secondary | ICD-10-CM | POA: Diagnosis not present

## 2020-03-01 DIAGNOSIS — R55 Syncope and collapse: Secondary | ICD-10-CM | POA: Diagnosis not present

## 2020-03-01 DIAGNOSIS — I10 Essential (primary) hypertension: Secondary | ICD-10-CM

## 2020-03-01 MED ORDER — NITROGLYCERIN 0.4 MG SL SUBL: 0.4000 mg | SUBLINGUAL_TABLET | SUBLINGUAL | 2 refills | Status: DC | PRN

## 2020-03-01 NOTE — Patient Instructions (Signed)
Medication Instructions:  Your physician recommends that you continue on your current medications as directed. Please refer to the Current Medication list given to you today.  *If you need a refill on your cardiac medications before your next appointment, please call your pharmacy*  Lab Work: NONE ordered at this time of appointment   If you have labs (blood work) drawn today and your tests are completely normal, you will receive your results only by: Marland Kitchen MyChart Message (if you have MyChart) OR . A paper copy in the mail If you have any lab test that is abnormal or we need to change your treatment, we will call you to review the results.  Testing/Procedures: NONE ordered at this time of appointment   Follow-Up: At [facility], you and your health needs are our priority.  As part of our continuing mission to provide you with exceptional heart care, we have created designated Provider Care Teams.  These Care Teams include your primary Cardiologist (physician) and Advanced Practice Providers (APPs -  Physician Assistants and Nurse Practitioners) who all work together to provide you with the care you need, when you need it.  We recommend signing up for the patient portal called "MyChart".  Sign up information is provided on this After Visit Summary.  MyChart is used to connect with patients for Virtual Visits (Telemedicine).  Patients are able to view lab/test results, encounter notes, upcoming appointments, etc.  Non-urgent messages can be sent to your provider as well.   To learn more about what you can do with MyChart, go to [email or web address].    Your next appointment:   3-4 month(s)  The format for your next appointment:   In Person  Provider:   Shelva Majestic, MD  Other Instructions  Give our office a call if you experience the dizziness or passing out feeling or if the symptoms become worse.

## 2020-03-01 NOTE — Progress Notes (Signed)
Cardiology Office Note:    Date:  03/03/2020   ID:  Dale [name], DOB 1928-10-24, MRN [id number]  PCP:  Redmond School, MD  [facility] Cardiologist:  Shelva Majestic, MD  [place] Electrophysiologist:  None   Referring MD: Redmond School, MD   Chief Complaint  Patient presents with  . Follow-up    seen for [name]    History of Present Illness:    Dale [name] is a 86 y.o. male with a hx of CAD, hypertension, hyperlipidemia, hypothyroidism, peptic ulcer disease, renal cell cancer and history of pulmonary sarcoidosis. He had initial intervention to RCA in 2000. A Cypher stent was placed in the RCA again in 2009. Last cardiac catheterization in September 2013 revealed 30% LAD lesion after the second diagonal vessel, normal left circumflex artery and a patent RCA stent. Echocardiogram obtained in August 2018 showed EF 55 to 60%. Myoview in August 2018 was negative for ischemia. Carotid Doppler in August 2018 showed mild disease bilaterally. I switched him from hydrochlorothiazide to Lasix in 2019 due to ankle edema. During the last follow-up with [name] in June 2021, Imdur was increased to 90 mg a.m. and 30 mg p.m.   Since the last visit, patient had a syncopal episode on 01/12/2020.  He was reading a literature when he had nausea and a flushing sensation while sitting in the chair.  His wife gave him a dose of nitroglycerin and he subsequently passed out.  Blood pressure on EMS arrival was 89/65.  CBG 112.  He was given 400 mm of IV fluid prior to ED arrival.  It was the first day he took Alfuzosin prescribed before his urinary frequency.  He has not taken this medication again.  Since he left the ED, he has not had any recurrent dizziness, blurred vision or feeling of passing out.  He denies any recent exertional chest discomfort.  Lower extremity does not have any swelling.  I recommended continue on the current therapy.  If he does have any recurrence of the symptom, then I  would consider echocardiogram and the heart monitor, for the time being given lack of recurrence for the past 2 months, I will hold off on additional study.  I recommend a follow-up in 3 to 4 months. His blood pressure is elevated today, however given the recent syncope, I am hesitant to control his blood pressure too aggressively.  We will continue on the current therapy.   Past Medical History:  Diagnosis Date  . Allergic rhinitis   . Anal fissure   . Anginal pain ([place])   . Asthma   . Cancer ([place])    skin  . Chest pain   . CHF (congestive heart failure) ([place])   . Coronary heart disease    s/p stenting. cath in 01/2012 noncritical occlusion  . Dysrhythmia    1st degree heart block  . GERD (gastroesophageal reflux disease)   . Glaucoma   . Hiatal hernia   . Hyperlipidemia   . Hypertension   . Hypothyroidism   . Idiopathic thrombocytopenic purpura ([place]) 2002  . Macular degeneration   . Nephrolithiasis   . PUD (peptic ulcer disease)    remote  . Sarcoidosis    pulmonary  . Schatzki's ring     Past Surgical History:  Procedure Laterality Date  . cardiac stents    . COLONOSCOPY  10/30/2006   Normal rectum, sigmoid diverticula.Remainder of colonic mucosa appeared normal.  . CORONARY ANGIOPLASTY WITH STENT PLACEMENT  about 10 years ago per pt (around 2007)  . CYSTOSCOPY WITH RETROGRADE PYELOGRAM, URETEROSCOPY AND STENT PLACEMENT Left 06/16/2017   Procedure: CYSTOSCOPY WITH RETROGRADE PYELOGRAM, URETEROSCOPY,STONE EXTRACTION  AND STENT PLACEMENT;  Surgeon: Franchot Gallo, MD;  Location: [facility];  Service: Urology;  Laterality: Left;  . ESOPHAGOGASTRODUODENOSCOPY  06/19/2004   Two esophageal rings and esophageal web as described above.  All of these were disrupted by passing 56-French Venia Minks dilator/ Candida esophagitis,which appears to be incidental given history of   antibiotic use, but nevertheless will be treated.  . ESOPHAGOGASTRODUODENOSCOPY  10/30/2006   Distal tandem  esophageal ring status post dilation disruption as  described above.  Otherwise normal esophagus/  Small hiatal hernia otherwise normal stomach, D1 and D2  . ESOPHAGOGASTRODUODENOSCOPY N/A 03/22/2015   Dr.Rourk- noncritical schatzki's ring and hiatal hernia-o/w normal EGD.   Marland Kitchen ESOPHAGOGASTRODUODENOSCOPY (EGD) WITH ESOPHAGEAL DILATION  03/04/2012   RMR- schatzki's ring, hiatal hernia, polypoid gastric mucosa, bx= minimally active gastritis.  Marland Kitchen HOLMIUM LASER APPLICATION Left 8/67/6195   Procedure: HOLMIUM LASER APPLICATION;  Surgeon: Franchot Gallo, MD;  Location: [facility];  Service: Urology;  Laterality: Left;  . IR GENERIC HISTORICAL  03/06/2016   IR RADIOLOGIST EVAL & MGMT 03/06/2016 Aletta Edouard, MD [facility] INTERV RAD  . IR GENERIC HISTORICAL  06/18/2016   IR RADIOLOGIST EVAL & MGMT 06/18/2016 Aletta Edouard, MD [facility] INTERV RAD  . IR RADIOLOGIST EVAL & MGMT  10/01/2016  . IR RADIOLOGIST EVAL & MGMT  10/15/2017  . IR RADIOLOGIST EVAL & MGMT  12/24/2018  . IR RADIOLOGIST EVAL & MGMT  01/04/2020  . LEFT HEART CATH N/A 02/02/2012   Procedure: LEFT HEART CATH;  Surgeon: Lorretta Harp, MD;  Location: [facility] CATH LAB;  Service: Cardiovascular;  Laterality: N/A;  . MEDIASTINOSCOPY     for dx sarcoid  . RADIOLOGY WITH ANESTHESIA Left 05/17/2016   Procedure: left renal ablation;  Surgeon: Aletta Edouard, MD;  Location: [facility];  Service: Radiology;  Laterality: Left;    Current Medications: Current Meds  Medication Sig  . aspirin 81 MG tablet Take 81 mg by mouth 3 (three) times a week.   Marland Kitchen atorvastatin (LIPITOR) 40 MG tablet TAKE 1 TABLET BY MOUTH IN  THE EVENING  . brimonidine (ALPHAGAN) 0.2 % ophthalmic solution Place 1 drop into both eyes 2 (two) times daily.  . clopidogrel (PLAVIX) 75 MG tablet Take 1 tablet (75 mg total) by mouth daily.  . dorzolamide (TRUSOPT) 2 % ophthalmic solution Place 1 drop into both eyes 2 (two) times daily.   . dorzolamide-timolol (COSOPT) 22.3-6.8 MG/ML ophthalmic  solution 1 drop 2 (two) times daily.  Marland Kitchen doxycycline (VIBRAMYCIN) 100 MG capsule Take 100 mg by mouth 2 (two) times daily.  Marland Kitchen guaiFENesin (MUCINEX) 600 MG 12 hr tablet Take 1,200 mg by mouth daily.   . isosorbide mononitrate (IMDUR) 60 MG 24 hr tablet TAKE 1.5 TABLETS (90MG ) BY MOUTH IN  THE MORNING AND ONE-HALF TABLET (30MG ) BY MOUTH AT NIGHT.  Marland Kitchen levothyroxine (SYNTHROID) 100 MCG tablet TAKE 1 TABLET BY MOUTH  DAILY  . losartan (COZAAR) 100 MG tablet TAKE 1 TABLET BY MOUTH  DAILY  . metoprolol tartrate (LOPRESSOR) 25 MG tablet Take 1 tablet (25 mg) in the morning, and 0.5 tablet (12.5 mg) in the evening.  . mometasone (ELOCON) 0.1 % ointment Apply 1 application topically daily.   . Multiple Vitamin (MULTIVITAMIN) tablet Take 1 tablet by mouth every evening.   . Multiple Vitamins-Minerals (PRESERVISION/LUTEIN) CAPS Take 1 capsule  by mouth 2 (two) times daily.   . nitroGLYCERIN (NITROSTAT) 0.4 MG SL tablet Place 1 tablet (0.4 mg total) under the tongue every 5 (five) minutes as needed. For chest pain  . pantoprazole (PROTONIX) 40 MG tablet TAKE 1 TABLET BY MOUTH  DAILY BEFORE BREAKFAST  . potassium chloride SA (KLOR-CON) 20 MEQ tablet Take 1 tablet (20 mEq total) by mouth daily.  Marland Kitchen ROCKLATAN 0.02-0.005 % SOLN Apply to eye.  . timolol (TIMOPTIC) 0.5 % ophthalmic solution Place 1 drop into both eyes daily.   Marland Kitchen triamcinolone (KENALOG) 0.1 % cream Apply 1 application topically 2 (two) times daily as needed (for irritation).   . vitamin C (ASCORBIC ACID) 500 MG tablet Take 500 mg by mouth daily.   . vitamin E 200 UNIT capsule Take 200 Units by mouth daily at 12 noon.   . [DISCONTINUED] furosemide (LASIX) 40 MG tablet TAKE 1 TABLET(40 MG) BY MOUTH DAILY- take 20 mg additional after 4:00 pm if continued swelling as needed.  . [DISCONTINUED] nitroGLYCERIN (NITROSTAT) 0.4 MG SL tablet Place 1 tablet (0.4 mg total) under the tongue every 5 (five) minutes as needed. For chest pain     Allergies:    Azithromycin, Doxazosin, Acetaminophen, Atenolol, Hydrocodone, Hydrocodone-acetaminophen, Levofloxacin, Morphine, Penicillins, and Sulfonamide derivatives   Social History   Socioeconomic History  . Marital status: Married    Spouse name: Not on file  . Number of children: 1  . Years of education: Not on file  . Highest education level: Not on file  Occupational History  . 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 Smoking status: Former Smoker    Packs/day: 0.10    Years: 2.00    Pack years: 0.20    Types: Cigarettes, Cigars    Quit date: 05/06/1970    Years since quitting: 49.8  . Smokeless tobacco: Never Used  Vaping Use  . Vaping Use: Never used  Substance and Sexual Activity  . Alcohol use: No    Alcohol/week: 0.0 standard drinks  . Drug use: No  . Sexual activity: Never  Other Topics Concern  . Not on file  Social History Narrative  . Not on file   Social Determinants of Health   Financial Resource Strain:   . Difficulty of Paying Living Expenses: Not on file  Food Insecurity:   . Worried About Charity fundraiser in the Last Year: Not on file  . Ran Out of Food in the Last Year: Not on file  Transportation Needs:   . Lack of Transportation (Medical): Not on file  . Lack of Transportation (Non-Medical): Not on file  Physical Activity:   . Days of Exercise per Week: Not on file  . Minutes of Exercise per Session: Not on file  Stress:   . Feeling of Stress : Not on file  Social Connections:   . Frequency of Communication with Friends and Family: Not on file  . Frequency of Social Gatherings with Friends and Family: Not on file  . Attends Religious Services: Not on file  . Active Member of Clubs or Organizations: Not on file  . Attends Archivist Meetings: Not on file  . Marital Status: Not on file     Family History: The patient's family history includes Alzheimer's disease in his mother;  Cancer in an other family member; Heart attack in his brother; Heart disease in his father; Stroke in his mother.  There is no history of Colon cancer.  ROS:   Please see the history of present illness.     All other systems reviewed and are negative.  EKGs/Labs/Other Studies Reviewed:    The following studies were reviewed today:  Echo 12/23/2016  LV EF: 55% -  60%   -------------------------------------------------------------------  Indications:   R07.9 Chest pain, unspecified.   -------------------------------------------------------------------  History:  PMH: Hypothyroidism. First-degree AV block. Renal cell  carcinoma. Coronary artery disease. Risk factors: Hypertension.  Dyslipidemia.   -------------------------------------------------------------------  Study Conclusions   - Left ventricle: The cavity size was normal. Systolic function was  normal. The estimated ejection fraction was in the range of 55%  to 60%. Wall motion was normal; there were no regional wall  motion abnormalities. Left ventricular diastolic function  parameters were normal.   EKG:  EKG is not ordered today.    Recent Labs: 10/26/2019: ALT 21; TSH 3.010 01/12/2020: BUN 29; Creatinine, Ser 0.95; Hemoglobin 10.2; Platelets 146; Potassium 4.5; Sodium 136  Recent Lipid Panel    Component Value Date/Time   CHOL 123 10/26/2019 1013   TRIG 107 10/26/2019 1013   HDL 41 10/26/2019 1013   CHOLHDL 3.0 10/26/2019 1013   CHOLHDL 2.6 01/02/2017 0612   VLDL 10 01/02/2017 0612   LDLCALC 62 10/26/2019 1013     Risk Assessment/Calculations:       Physical Exam:    VS:  BP (!) 154/66   Pulse 60   Ht 5\' 8"  (1.727 m)   Wt 141 lb 9.6 oz (64.2 kg)   SpO2 94%   BMI 21.53 kg/m     Wt Readings from Last 3 Encounters:  03/01/20 141 lb 9.6 oz (64.2 kg)  02/22/20 140 lb (63.5 kg)  01/12/20 140 lb (63.5 kg)     GEN:  Well nourished, well developed in no acute distress HEENT:  Normal NECK: No JVD; No carotid bruits LYMPHATICS: No lymphadenopathy CARDIAC: RRR, no murmurs, rubs, gallops RESPIRATORY:  Clear to auscultation without rales, wheezing or rhonchi  ABDOMEN: Soft, non-tender, non-distended MUSCULOSKELETAL:  No edema; No deformity  SKIN: Warm and dry NEUROLOGIC:  Alert and oriented x 3 PSYCHIATRIC:  Normal affect   ASSESSMENT:    1. Syncope and collapse   2. Coronary artery disease involving native coronary artery of native heart without angina pectoris   3. Essential hypertension   4. Hyperlipidemia LDL goal <70   5. Hypothyroidism, unspecified type   6. Pulmonary sarcoidosis ([place])    PLAN:    In order of problems listed above:  1. Syncope: Patient recently had a syncopal episode in early September, he had a sudden drop in the blood pressure, since then he has not had any dizziness or recurrent symptoms.  Given lack of recurrent symptoms for the past 2 months, I would recommend continued observation at this point.  If symptoms does recur, he will need echocardiogram and heart monitor  2. CAD: Denies any recent chest pain  3. Hypertension: Continue on current therapy  4. Hyperlipidemia: On Lipitor  5. Hypothyroidism: On levothyroxine  6. Pulmonary sarcoidosis: No significant increase of shortness of breath.   Medication Adjustments/Labs and Tests Ordered: Current medicines are reviewed at length with the patient today.  Concerns regarding medicines are outlined above.  No orders of the defined types were placed in this encounter.  Meds ordered this encounter  Medications  . nitroGLYCERIN (NITROSTAT) 0.4 MG SL tablet    Sig: Place 1 tablet (0.4 mg total) under the  tongue every 5 (five) minutes as needed. For chest pain    Dispense:  25 tablet    Refill:  2    Patient Instructions  Medication Instructions:  Your physician recommends that you continue on your current medications as directed. Please refer to the Current Medication list  given to you today.  *If you need a refill on your cardiac medications before your next appointment, please call your pharmacy*  Lab Work: NONE ordered at this time of appointment   If you have labs (blood work) drawn today and your tests are completely normal, you will receive your results only by: Marland Kitchen MyChart Message (if you have MyChart) OR . A paper copy in the mail If you have any lab test that is abnormal or we need to change your treatment, we will call you to review the results.  Testing/Procedures: NONE ordered at this time of appointment   Follow-Up: At [facility], you and your health needs are our priority.  As part of our continuing mission to provide you with exceptional heart care, we have created designated Provider Care Teams.  These Care Teams include your primary Cardiologist (physician) and Advanced Practice Providers (APPs -  Physician Assistants and Nurse Practitioners) who all work together to provide you with the care you need, when you need it.  We recommend signing up for the patient portal called "MyChart".  Sign up information is provided on this After Visit Summary.  MyChart is used to connect with patients for Virtual Visits (Telemedicine).  Patients are able to view lab/test results, encounter notes, upcoming appointments, etc.  Non-urgent messages can be sent to your provider as well.   To learn more about what you can do with MyChart, go to [email or web address].    Your next appointment:   3-4 month(s)  The format for your next appointment:   In Person  Provider:   Shelva Majestic, MD  Other Instructions  Give our office a call if you experience the dizziness or passing out feeling or if the symptoms become worse.      Hilbert Corrigan, [place]  03/03/2020 11:33 PM    [facility]

## 2020-03-02 ENCOUNTER — Other Ambulatory Visit: Payer: Self-pay | Admitting: Cardiovascular Disease

## 2020-03-03 ENCOUNTER — Encounter: Payer: Self-pay | Admitting: Physician Assistant

## 2020-03-16 DIAGNOSIS — J019 Acute sinusitis, unspecified: Secondary | ICD-10-CM | POA: Diagnosis not present

## 2020-03-16 DIAGNOSIS — J069 Acute upper respiratory infection, unspecified: Secondary | ICD-10-CM | POA: Diagnosis not present

## 2020-03-16 DIAGNOSIS — J209 Acute bronchitis, unspecified: Secondary | ICD-10-CM | POA: Diagnosis not present

## 2020-04-05 DIAGNOSIS — Z9889 Other specified postprocedural states: Secondary | ICD-10-CM | POA: Diagnosis not present

## 2020-04-05 DIAGNOSIS — H401121 Primary open-angle glaucoma, left eye, mild stage: Secondary | ICD-10-CM | POA: Diagnosis not present

## 2020-04-05 DIAGNOSIS — H353132 Nonexudative age-related macular degeneration, bilateral, intermediate dry stage: Secondary | ICD-10-CM | POA: Diagnosis not present

## 2020-04-05 DIAGNOSIS — H0102B Squamous blepharitis left eye, upper and lower eyelids: Secondary | ICD-10-CM | POA: Diagnosis not present

## 2020-04-05 DIAGNOSIS — Z961 Presence of intraocular lens: Secondary | ICD-10-CM | POA: Diagnosis not present

## 2020-04-05 DIAGNOSIS — H401113 Primary open-angle glaucoma, right eye, severe stage: Secondary | ICD-10-CM | POA: Diagnosis not present

## 2020-04-05 DIAGNOSIS — H04123 Dry eye syndrome of bilateral lacrimal glands: Secondary | ICD-10-CM | POA: Diagnosis not present

## 2020-04-05 DIAGNOSIS — H43813 Vitreous degeneration, bilateral: Secondary | ICD-10-CM | POA: Diagnosis not present

## 2020-04-05 DIAGNOSIS — H0102A Squamous blepharitis right eye, upper and lower eyelids: Secondary | ICD-10-CM | POA: Diagnosis not present

## 2020-04-06 ENCOUNTER — Telehealth: Payer: Self-pay

## 2020-04-06 NOTE — Telephone Encounter (Signed)
   [facility] Pre-operative Risk Assessment    Request for surgical clearance:  1. What type of surgery is being performed? XEN OD-RIGHT  2. When is this surgery scheduled? TBD   3. What type of clearance is required (medical clearance vs. Pharmacy clearance to hold med vs. Both)? PHARMACY  4. Are there any medications that need to be held prior to surgery and how long? PLAVIX AND ASA   5. Practice name and name of physician performing surgery? [facility]   6. What is the office phone number? [phone]   7.   What is the office fax number? [phone]  8.   Anesthesia type (None, local, MAC, general) ? MAC

## 2020-04-06 NOTE — Telephone Encounter (Signed)
[place] to receive clarity on procedure unable to leave message.

## 2020-04-06 NOTE — Telephone Encounter (Signed)
Can you please ask for clarification on the procedure?

## 2020-04-11 DIAGNOSIS — K4090 Unilateral inguinal hernia, without obstruction or gangrene, not specified as recurrent: Secondary | ICD-10-CM | POA: Diagnosis not present

## 2020-04-11 DIAGNOSIS — J329 Chronic sinusitis, unspecified: Secondary | ICD-10-CM | POA: Diagnosis not present

## 2020-04-11 DIAGNOSIS — Z681 Body mass index (BMI) 19 or less, adult: Secondary | ICD-10-CM | POA: Diagnosis not present

## 2020-04-11 DIAGNOSIS — I10 Essential (primary) hypertension: Secondary | ICD-10-CM | POA: Diagnosis not present

## 2020-04-11 DIAGNOSIS — M1991 Primary osteoarthritis, unspecified site: Secondary | ICD-10-CM | POA: Diagnosis not present

## 2020-04-13 DIAGNOSIS — Z23 Encounter for immunization: Secondary | ICD-10-CM | POA: Diagnosis not present

## 2020-04-19 NOTE — Telephone Encounter (Signed)
[name] This patient has a history of a cypher stent in RCA (2009) after initial intervention to RCA in 2000. Last cath in 2013 with patent stents.   We are asked to hold ASA and plavix for shunt placement for glaucoma.  OK to hold DAPT?

## 2020-04-19 NOTE — Telephone Encounter (Signed)
I s/w [facility] and confirmed procedure to be done. Procedure is for Glaucoma. A shunt to be placed in the right eye to help drain fluid. I will update the pre op team with the new information. Surgery is actually set for 05/11/20.

## 2020-04-21 NOTE — Telephone Encounter (Signed)
Okay to hold aspirin and Plavix for his procedure.  Should hold Plavix for at least 5 DAYS

## 2020-04-23 NOTE — Telephone Encounter (Signed)
I have forwarded [name] recommendation to the surgeon's office.  Call back poor to inform the patient to hold Plavix for 5 days and to hold aspirin for 7 days prior to the procedure and restart as soon as possible after the procedure at the surgeon's discretion.

## 2020-04-24 NOTE — Telephone Encounter (Signed)
Called pt and notified to hold plavix-5 days and ASA 7 days. Pt verbalized understanding, repeated multiple times for clarification.  Forwarded to requesting providers office via [place] fax function

## 2020-05-02 ENCOUNTER — Encounter: Payer: Self-pay | Admitting: Cardiovascular Disease

## 2020-05-02 NOTE — Telephone Encounter (Signed)
Error

## 2020-05-05 DIAGNOSIS — I2510 Atherosclerotic heart disease of native coronary artery without angina pectoris: Secondary | ICD-10-CM | POA: Diagnosis not present

## 2020-05-05 DIAGNOSIS — E7849 Other hyperlipidemia: Secondary | ICD-10-CM | POA: Diagnosis not present

## 2020-05-05 DIAGNOSIS — E063 Autoimmune thyroiditis: Secondary | ICD-10-CM | POA: Diagnosis not present

## 2020-05-05 DIAGNOSIS — I10 Essential (primary) hypertension: Secondary | ICD-10-CM | POA: Diagnosis not present

## 2020-05-11 DIAGNOSIS — H401113 Primary open-angle glaucoma, right eye, severe stage: Secondary | ICD-10-CM | POA: Diagnosis not present

## 2020-05-15 ENCOUNTER — Other Ambulatory Visit: Payer: Self-pay | Admitting: Cardiovascular Disease

## 2020-06-03 DIAGNOSIS — E7849 Other hyperlipidemia: Secondary | ICD-10-CM | POA: Diagnosis not present

## 2020-06-03 DIAGNOSIS — I2510 Atherosclerotic heart disease of native coronary artery without angina pectoris: Secondary | ICD-10-CM | POA: Diagnosis not present

## 2020-06-03 DIAGNOSIS — I10 Essential (primary) hypertension: Secondary | ICD-10-CM | POA: Diagnosis not present

## 2020-06-03 DIAGNOSIS — E063 Autoimmune thyroiditis: Secondary | ICD-10-CM | POA: Diagnosis not present

## 2020-06-14 ENCOUNTER — Ambulatory Visit: Payer: Self-pay | Admitting: Allergy & Immunology

## 2020-06-16 ENCOUNTER — Telehealth: Payer: Self-pay | Admitting: Internal Medicine

## 2020-06-16 ENCOUNTER — Encounter: Payer: Self-pay | Admitting: Internal Medicine

## 2020-06-16 DIAGNOSIS — K219 Gastro-esophageal reflux disease without esophagitis: Secondary | ICD-10-CM

## 2020-06-16 MED ORDER — PANTOPRAZOLE SODIUM 40 MG PO TBEC: DELAYED_RELEASE_TABLET | ORAL | 0 refills | Status: DC

## 2020-06-16 NOTE — Telephone Encounter (Signed)
PATIENT NEEDS HIS PANTOPRAZOLE PRESCRIPTION SENT TO [facility]

## 2020-06-16 NOTE — Addendum Note (Signed)
Addended by: Annitta Needs on: 06/16/2020 11:40 AM   Modules accepted: Orders

## 2020-06-16 NOTE — Telephone Encounter (Signed)
Sent refill to [facility], but he needs an office visit prior to additional refills. Last seen in March 2019.

## 2020-06-16 NOTE — Telephone Encounter (Signed)
Routing to [facility] refill box. Pantoprazole 40 mg daily

## 2020-07-03 ENCOUNTER — Other Ambulatory Visit: Payer: Self-pay | Admitting: Cardiovascular Disease

## 2020-07-04 ENCOUNTER — Telehealth: Payer: Self-pay | Admitting: *Deleted

## 2020-07-04 NOTE — Telephone Encounter (Signed)
87 yo male with - CAD, s/p PCI in 2000, s/p Cypher DES to RCA in 2009 - HTN - HL - Hypothyroidism  - Renal Cell CA - Peptic ulcer disease - ITP - Macular degeneration - Sarcoidosis, pulmonary  - 01/12/2020: Creatinine, Ser 0.95    Echo 12/23/2016: EF 55-60, no significant valve disease Myoview 12/2016: No ischemia or infarction, low risk  Last OV 03/01/20, [name], PA-C Review of notes indicates [name] cleared patient to hold aspirin (7 days) and Plavix (5 days) in 04/2020 for an eye procedure.  Could not leave message for pt to call back. Of note, pt has appt with [name] 07/08/19. Richardson Dopp, PA-C    07/04/2020 12:39 PM

## 2020-07-04 NOTE — Telephone Encounter (Signed)
   [facility] Pre-operative Risk Assessment    HEARTCARE STAFF: - Please ensure there is not already an duplicate clearance open for this procedure. - Under Visit Info/Reason for Call, type in Other and utilize the format Clearance MM/DD/YY or Clearance TBD. Do not use dashes or single digits. - If request is for dental extraction, please clarify the # of teeth to be extracted.  Request for surgical clearance:  1. What type of surgery is being performed? Bleb needling OD   2. When is this surgery scheduled? 07/13/20   3. What type of clearance is required (medical clearance vs. Pharmacy clearance to hold med vs. Both)? both  4. Are there any medications that need to be held prior to surgery and how long?plavix/asa-they need direction   5. Practice name and name of physician performing surgery? [facility]   6. What is the office phone number? [phone]   7.   What is the office fax number? [phone]  8.   Anesthesia type (None, local, MAC, general) ? MAC general   Fredia Beets 07/04/2020, 10:51 AM  _________________________________________________________________   (provider comments below)

## 2020-07-07 ENCOUNTER — Ambulatory Visit: Payer: Medicare Other | Admitting: Cardiovascular Disease

## 2020-07-07 NOTE — Telephone Encounter (Signed)
   Primary Cardiologist: Shelva Majestic, MD  Chart reviewed as part of pre-operative protocol coverage. Patient was contacted 07/07/2020 in reference to pre-operative risk assessment for pending surgery as outlined below.  Dale [name] was last seen on 03/01/20 by [name], PA.  Since that day, Dale [name] has done well. Reports no chest pain, shortness of breath, nor syncope.  Therefore, based on ACC/AHA guidelines, the patient would be at acceptable risk for the planned procedure without further cardiovascular testing.   Per previous recommendation, he may hold Aspirin 7 days prior and Plavix 5 days prior.   The patient was advised that if he develops new symptoms prior to surgery to contact our office to arrange for a follow-up visit, and he verbalized understanding.  I will route this recommendation to the requesting party via Epic fax function and remove from pre-op pool. Please call with questions.  Loel Dubonnet, NP 07/07/2020, 12:02 PM

## 2020-07-13 DIAGNOSIS — H401111 Primary open-angle glaucoma, right eye, mild stage: Secondary | ICD-10-CM | POA: Diagnosis not present

## 2020-07-13 DIAGNOSIS — H401113 Primary open-angle glaucoma, right eye, severe stage: Secondary | ICD-10-CM | POA: Diagnosis not present

## 2020-08-02 DIAGNOSIS — E7849 Other hyperlipidemia: Secondary | ICD-10-CM | POA: Diagnosis not present

## 2020-08-02 DIAGNOSIS — I10 Essential (primary) hypertension: Secondary | ICD-10-CM | POA: Diagnosis not present

## 2020-08-09 ENCOUNTER — Other Ambulatory Visit: Payer: Self-pay

## 2020-08-09 ENCOUNTER — Ambulatory Visit (INDEPENDENT_AMBULATORY_CARE_PROVIDER_SITE_OTHER): Payer: Medicare Other | Admitting: Nurse Practitioner

## 2020-08-09 ENCOUNTER — Encounter: Payer: Self-pay | Admitting: Nurse Practitioner

## 2020-08-09 VITALS — BP 172/79 | HR 62 | Temp 97.1°F | Ht 70.0 in | Wt 145.2 lb

## 2020-08-09 DIAGNOSIS — K219 Gastro-esophageal reflux disease without esophagitis: Secondary | ICD-10-CM

## 2020-08-09 DIAGNOSIS — R1084 Generalized abdominal pain: Secondary | ICD-10-CM

## 2020-08-09 NOTE — Progress Notes (Signed)
Referring Provider: Redmond School, MD Primary Care Physician:  Redmond School, MD Primary GI:  Dr. Gala Romney  Chief Complaint  Patient presents with  . Gastroesophageal Reflux    Doing ok    HPI:   Dale [name] is a 87 y.o. male who presents for evaluation of GERD.  Patient last seen in our office 07/22/2017 for the same as well as weight loss.  At that time noted GERD well controlled on Protonix 40 mg twice daily without dysphagia symptoms.  Has added 8 pounds since his last visit, noted good appetite.  Has been having gross hematuria at that time and had lithotripsy and has a follow-up scheduled with urology.  Recommend to decrease Plavix to 40 mg daily, see urology, GERD information provided, follow-up in 1 year.  He has not been seen in our office since.  He continues to take Protonix based on phone calls for medication refills.  Today states doing okay overall. GERD is doing well on Protonix 40 mg daily. He had an episode today where he ate oatmeal and a cookie and developed generalized abdominal pain. He took [employer] and laid down and the pain resolved. He has never had pain like this before. Denies N/V, hematochezia, melena, fever, chills, unintentional weight loss. Appetite is good. Has been steadily but slowing improving his weight (gaining weight). Denies URI or flu-like symptoms. Denies loss of sense of taste or smell. He did have a recent sinus infection which resolved with antibiotics. He found out the culprit may have been his glaucoma eye drops. Denies chest pain, dyspnea, dizziness, lightheadedness, syncope, near syncope. Denies any other upper or lower GI symptoms.  Past Medical History:  Diagnosis Date  . Allergic rhinitis   . Anal fissure   . Anginal pain ([place])   . Asthma   . Cancer ([place])    skin  . Chest pain   . CHF (congestive heart failure) ([place])   . Coronary heart disease    s/p stenting. cath in 01/2012 noncritical occlusion  . Dysrhythmia    1st  degree heart block  . GERD (gastroesophageal reflux disease)   . Glaucoma   . Hiatal hernia   . Hyperlipidemia   . Hypertension   . Hypothyroidism   . Idiopathic thrombocytopenic purpura ([place]) 2002  . Macular degeneration   . Nephrolithiasis   . PUD (peptic ulcer disease)    remote  . Sarcoidosis    pulmonary  . Schatzki's ring     Past Surgical History:  Procedure Laterality Date  . cardiac stents    . COLONOSCOPY  10/30/2006   Normal rectum, sigmoid diverticula.Remainder of colonic mucosa appeared normal.  . CORONARY ANGIOPLASTY WITH STENT PLACEMENT     about 10 years ago per pt (around 2007)  . CYSTOSCOPY WITH RETROGRADE PYELOGRAM, URETEROSCOPY AND STENT PLACEMENT Left 06/16/2017   Procedure: CYSTOSCOPY WITH RETROGRADE PYELOGRAM, URETEROSCOPY,STONE EXTRACTION  AND STENT PLACEMENT;  Surgeon: Franchot Gallo, MD;  Location: [facility];  Service: Urology;  Laterality: Left;  . ESOPHAGOGASTRODUODENOSCOPY  06/19/2004   Two esophageal rings and esophageal web as described above.  All of these were disrupted by passing 56-French Venia Minks dilator/ Candida esophagitis,which appears to be incidental given history of   antibiotic use, but nevertheless will be treated.  . ESOPHAGOGASTRODUODENOSCOPY  10/30/2006   Distal tandem esophageal ring status post dilation disruption as  described above.  Otherwise normal esophagus/  Small hiatal hernia otherwise normal stomach, D1 and D2  . ESOPHAGOGASTRODUODENOSCOPY N/A 03/22/2015  Dr.Rourk- noncritical schatzki's ring and hiatal hernia-o/w normal EGD.   Marland Kitchen ESOPHAGOGASTRODUODENOSCOPY (EGD) WITH ESOPHAGEAL DILATION  03/04/2012   RMR- schatzki's ring, hiatal hernia, polypoid gastric mucosa, bx= minimally active gastritis.  Marland Kitchen HOLMIUM LASER APPLICATION Left 5/73/2202   Procedure: HOLMIUM LASER APPLICATION;  Surgeon: Franchot Gallo, MD;  Location: [facility];  Service: Urology;  Laterality: Left;  . IR GENERIC HISTORICAL  03/06/2016   IR RADIOLOGIST EVAL &  MGMT 03/06/2016 Aletta Edouard, MD [facility] INTERV RAD  . IR GENERIC HISTORICAL  06/18/2016   IR RADIOLOGIST EVAL & MGMT 06/18/2016 Aletta Edouard, MD [facility] INTERV RAD  . IR RADIOLOGIST EVAL & MGMT  10/01/2016  . IR RADIOLOGIST EVAL & MGMT  10/15/2017  . IR RADIOLOGIST EVAL & MGMT  12/24/2018  . IR RADIOLOGIST EVAL & MGMT  01/04/2020  . LEFT HEART CATH N/A 02/02/2012   Procedure: LEFT HEART CATH;  Surgeon: Lorretta Harp, MD;  Location: [facility] CATH LAB;  Service: Cardiovascular;  Laterality: N/A;  . MEDIASTINOSCOPY     for dx sarcoid  . RADIOLOGY WITH ANESTHESIA Left 05/17/2016   Procedure: left renal ablation;  Surgeon: Aletta Edouard, MD;  Location: [facility];  Service: Radiology;  Laterality: Left;    Current Outpatient Medications  Medication Sig Dispense Refill  . aspirin 81 MG tablet Take 81 mg by mouth 3 (three) times a week.     Marland Kitchen atorvastatin (LIPITOR) 40 MG tablet TAKE 1 TABLET BY MOUTH IN  THE EVENING 90 tablet 3  . brimonidine (ALPHAGAN) 0.2 % ophthalmic solution Place 1 drop into both eyes 2 (two) times daily.    . clopidogrel (PLAVIX) 75 MG tablet TAKE 1 TABLET BY MOUTH  DAILY 90 tablet 0  . dorzolamide (TRUSOPT) 2 % ophthalmic solution Place 1 drop into both eyes 2 (two) times daily.    . dorzolamide-timolol (COSOPT) 22.3-6.8 MG/ML ophthalmic solution 1 drop 2 (two) times daily.    Marland Kitchen doxycycline (VIBRAMYCIN) 100 MG capsule Take 100 mg by mouth 2 (two) times daily.    . furosemide (LASIX) 40 MG tablet TAKE 1 TABLET BY MOUTH  DAILY TAKE ONE-HALF TABLET  BY MOUTH ADDITIONAL AFTER  4:00 PM IF CONTINUED  SWELLING AS NEEDED 135 tablet 3  . guaiFENesin (MUCINEX) 600 MG 12 hr tablet Take 1,200 mg by mouth daily.     . isosorbide mononitrate (IMDUR) 60 MG 24 hr tablet TAKE 1.5 TABLETS (90MG ) BY MOUTH IN  THE MORNING AND ONE-HALF TABLET (30MG ) BY MOUTH AT NIGHT. 180 tablet 3  . levothyroxine (SYNTHROID) 100 MCG tablet TAKE 1 TABLET BY MOUTH  DAILY 90 tablet 3  . losartan (COZAAR) 100 MG tablet TAKE  1 TABLET BY MOUTH  DAILY 90 tablet 3  . metoprolol tartrate (LOPRESSOR) 25 MG tablet Take 1 tablet (25 mg) in the morning, and 0.5 tablet (12.5 mg) in the evening. 135 tablet 3  . mometasone (ELOCON) 0.1 % ointment Apply 1 application topically daily.     . Multiple Vitamin (MULTIVITAMIN) tablet Take 1 tablet by mouth every evening.    . Multiple Vitamins-Minerals (PRESERVISION/LUTEIN) CAPS Take 1 capsule by mouth 2 (two) times daily.    . nitroGLYCERIN (NITROSTAT) 0.4 MG SL tablet Place 1 tablet (0.4 mg total) under the tongue every 5 (five) minutes as needed. For chest pain 25 tablet 2  . pantoprazole (PROTONIX) 40 MG tablet TAKE 1 TABLET BY MOUTH  DAILY BEFORE BREAKFAST 90 tablet 0  . potassium chloride SA (KLOR-CON) 20 MEQ tablet TAKE 1 TABLET  BY MOUTH  DAILY 90 tablet 3  . ROCKLATAN 0.02-0.005 % SOLN Apply to eye.    . timolol (TIMOPTIC) 0.5 % ophthalmic solution Place 1 drop into both eyes daily.    Marland Kitchen triamcinolone (KENALOG) 0.1 % cream Apply 1 application topically 2 (two) times daily as needed (for irritation).    . vitamin C (ASCORBIC ACID) 500 MG tablet Take 500 mg by mouth daily.    . vitamin E 200 UNIT capsule Take 200 Units by mouth daily at 12 noon.      No current facility-administered medications for this visit.    Allergies as of 08/09/2020 - Review Complete 08/09/2020  Allergen Reaction Noted  . Azithromycin Other (See Comments) 06/03/2013  . Doxazosin Shortness Of Breath 02/11/2012  . Acetaminophen Other (See Comments)   . Atenolol Other (See Comments)   . Hydrocodone Nausea And Vomiting 08/15/2011  . Hydrocodone-acetaminophen Nausea Only 07/19/2011  . Levofloxacin Other (See Comments) 09/26/2014  . Morphine Other (See Comments)   . Penicillins Nausea And Vomiting and Other (See Comments)   . Sulfonamide derivatives Nausea And Vomiting     Family History  Problem Relation Age of Onset  . Heart disease Father        deceased age 48  . Stroke Mother   .  Alzheimer's disease Mother   . Heart attack Brother        deceased age 27  . Cancer Other        niece  . Colon cancer Neg Hx     Social History   Socioeconomic History  . Marital status: Married    Spouse name: Not on file  . Number of children: 1  . Years of education: Not on file  . Highest education level: Not on file  Occupational History  . 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 Smoking status: Former Smoker    Packs/day: 0.10    Years: 2.00    Pack years: 0.20    Types: Cigarettes, Cigars    Quit date: 05/06/1970    Years since quitting: 50.2  . Smokeless tobacco: Never Used  Vaping Use  . Vaping Use: Never used  Substance and Sexual Activity  . Alcohol use: No    Alcohol/week: 0.0 standard drinks  . Drug use: No  . Sexual activity: Never  Other Topics Concern  . Not on file  Social History Narrative  . Not on file   Social Determinants of Health   Financial Resource Strain: Not on file  Food Insecurity: Not on file  Transportation Needs: Not on file  Physical Activity: Not on file  Stress: Not on file  Social Connections: Not on file    Subjective: Review of Systems  Constitutional: Negative for chills, fever, malaise/fatigue and weight loss.  HENT: Negative for congestion and sore throat.   Respiratory: Negative for cough and shortness of breath.   Cardiovascular: Negative for chest pain and palpitations.  Gastrointestinal: Positive for abdominal pain (This morningl resolved now). Negative for blood in stool, diarrhea, heartburn, melena, nausea and vomiting.  Musculoskeletal: Negative for joint pain and myalgias.  Skin: Negative for rash.  Neurological: Negative for dizziness and weakness.  Endo/Heme/Allergies: Does not bruise/bleed easily.  Psychiatric/Behavioral: Negative for depression. The patient is not nervous/anxious.   All other systems reviewed and are  negative.    Objective: BP (!) 172/79   Pulse 62   Temp (!) 97.1  F (36.2 C) (Temporal)   Ht 5\' 10"  (1.778 m)   Wt 145 lb 3.2 oz (65.9 kg)   BMI 20.83 kg/m  Physical Exam Vitals and nursing note reviewed.  Constitutional:      General: He is not in acute distress.    Appearance: Normal appearance. He is normal weight. He is not ill-appearing, toxic-appearing or diaphoretic.  HENT:     Head: Normocephalic and atraumatic.     Nose: No congestion or rhinorrhea.  Eyes:     General: No scleral icterus. Cardiovascular:     Rate and Rhythm: Normal rate and regular rhythm.     Heart sounds: Normal heart sounds.  Pulmonary:     Effort: Pulmonary effort is normal.     Breath sounds: Normal breath sounds.  Abdominal:     General: Bowel sounds are normal. There is no distension.     Palpations: Abdomen is soft. There is no hepatomegaly, splenomegaly or mass.     Tenderness: There is no abdominal tenderness. There is no guarding or rebound.     Hernia: No hernia is present.  Musculoskeletal:     Cervical back: Neck supple.  Skin:    General: Skin is warm and dry.     Coloration: Skin is not jaundiced.     Findings: No bruising or rash.  Neurological:     General: No focal deficit present.     Mental Status: He is alert and oriented to person, place, and time. Mental status is at baseline.  Psychiatric:        Mood and Affect: Mood normal.        Behavior: Behavior normal.        Thought Content: Thought content normal.      Assessment:  Very pleasant 87 year old male who presents to follow-up on GERD.  Overall GERD is doing well, no breakthrough on Protonix 40 mg daily.  However, this morning he did have episode of significant abdominal pain after eating oatmeal and a cookie.  He took a dose of Pepto-Bismol and laid down for a nap and when he awoke his pain had resolved.  He states he has never had pain like this before.  Possibly food intolerance, gas, other food related  issues.  Doubt mesenteric ischemia given that he is not having before.  He is also no longer losing weight.  I recommended he keep an eye on this and notify us of any recurrence which point we can pursue a work-up   Plan: 1. Monitor for worsening abdominal pain or recurrent abdominal pain 2. Continue current medications 3. Follow-up in 1 year    Thank you for allowing [place] to participate in the care of Dale [name]  Demetrica Zipp, DNP, AGNP-C Adult & Gerontological Nurse Practitioner [facility]   08/09/2020 1:42 PM   Disclaimer: This note was dictated with voice recognition software. Similar sounding words can inadvertently be transcribed and may not be corrected upon review.

## 2020-08-09 NOTE — Patient Instructions (Signed)
Your health issues we discussed today were:   GERD (reflux/heartburn): 1. Glad your heartburn and reflux are doing well! 2. Continue to take Protonix once a day, pursing in the morning 3. Call us if you have any worsening heartburn symptoms  Abdominal pain: 1. I am sorry you had some abdominal pain this morning 2. Keep an eye on this and let us know if it continues to happen  Overall I recommend:  1. Continue other current medications 2. Return for follow-up 1 year 3. Call us for any questions or concerns   ---------------------------------------------------------------  I am glad you have gotten your COVID-19 vaccination!  Even though you are fully vaccinated you should continue to follow CDC and state/local guidelines.  ---------------------------------------------------------------   At [facility] we value your feedback. You may receive a survey about your visit today. Please share your experience as we strive to create trusting relationships with our patients to provide genuine, compassionate, quality care.  We appreciate your understanding and patience as we review any laboratory studies, imaging, and other diagnostic tests that are ordered as we care for you. Our office policy is 5 business days for review of these results, and any emergent or urgent results are addressed in a timely manner for your best interest. If you do not hear from our office in 1 week, please contact us.   We also encourage the use of MyChart, which contains your medical information for your review as well. If you are not enrolled in this feature, an access code is on this after visit summary for your convenience. Thank you for allowing [place] to be involved in your care.  It was great to see you today!  I hope you have a great spring!!

## 2020-08-14 ENCOUNTER — Other Ambulatory Visit: Payer: Self-pay | Admitting: Cardiovascular Disease

## 2020-08-17 ENCOUNTER — Other Ambulatory Visit: Payer: Self-pay | Admitting: Gastroenterology

## 2020-08-17 DIAGNOSIS — K219 Gastro-esophageal reflux disease without esophagitis: Secondary | ICD-10-CM

## 2020-08-22 ENCOUNTER — Encounter: Payer: Self-pay | Admitting: Urology

## 2020-08-22 ENCOUNTER — Other Ambulatory Visit: Payer: Self-pay

## 2020-08-22 ENCOUNTER — Ambulatory Visit (INDEPENDENT_AMBULATORY_CARE_PROVIDER_SITE_OTHER): Payer: Medicare Other | Admitting: Urology

## 2020-08-22 VITALS — BP 160/67 | HR 70 | Temp 98.0°F | Ht 68.0 in | Wt 149.0 lb

## 2020-08-22 DIAGNOSIS — Z85528 Personal history of other malignant neoplasm of kidney: Secondary | ICD-10-CM | POA: Diagnosis not present

## 2020-08-22 DIAGNOSIS — N401 Enlarged prostate with lower urinary tract symptoms: Secondary | ICD-10-CM

## 2020-08-22 DIAGNOSIS — R351 Nocturia: Secondary | ICD-10-CM | POA: Diagnosis not present

## 2020-08-22 NOTE — Progress Notes (Signed)
History of Present Illness: This man presents for follow-up of multiple issues.  10.19.2021: This man comes in today for follow-up of BPH.  At his last visit a month ago he was placed on alfuzosin due to feeling of incomplete emptying.  Unfortunately, the day later, he presented to the emergency room with syncopal episodes.  He has been off of alfuzosin since that time.  He still complains of lack of proper emptying but overall his stream is good, he only has nocturia x1.  No recent gross hematuria or dysuria.  4.19.2022: He has had stable urinary symptomatology.  He is not on any medical therapy at the present time but feels comfortable with the way his urination has been going.  Urolithiasis  2.11.2019: He underwent cystoscopy, left retrograde ureteral pyelogram, left ureteroscopic stone extraction, stent placement. Urothelial appearance of the left kidney and ureter were normal.   4.16.2019: No gross hematuria or flank pain since his last visit. Recent ultrasound revealed moderate left hydronephrosis.   4.19.2022: He has had no flank pain or gross hematuria.    Renal cell carcinoma  1.2017--saw Dr. Tresa Moore for follow-up of an enhancing 14 mm left renal mass. This was originally identified in December 2015 on a CT performed for hematuria. At that time, it was measured at 10 mm, although repeat measurement at the time of the second/follow-up CT revealed it was 12 mm. It was recommended that the patient proceed with surveillance of this lesion, as he was 44 and had comorbidities.   Followup CT scan revealed the lesion to be about 19 mm in size.  Because his lesion was enlarging, he was referred for possible cryoablation. This was performed   1.12.2018 by Dr. Aletta Edouard. Pathology revealed clear cell adenocarcinoma, WHO grade 2.  Follow-up CT abdomen and pelvis on 5.29.2018 revealed expected posttreatment scarring in the ablation bed with no evidence of residual tumor.    10.6.2020: He  was recently seen by Dr Kathlene Cote -- CT in August showed no signs of recurrence.   4.19.202:  Most recent CT 8.2021--1. The cryo ablation zone defect within the anterolateral cortex of the mid to lower pole of left kidney. Continues to decrease in overall size. A small focal area of arterial phase enhancement along the inferior margin of the ablation zone measures 3 mm. This is new when compared with 12/23/2018. Suggest more frequent interval surveillance to assess for any temporal change in the appearance of this new finding. 2. Aortic atherosclerosis.         Past Medical History:  Diagnosis Date  . Allergic rhinitis   . Anal fissure   . Anginal pain ([place])   . Asthma   . Cancer ([place])    skin  . Chest pain   . CHF (congestive heart failure) ([place])   . Coronary heart disease    s/p stenting. cath in 01/2012 noncritical occlusion  . Dysrhythmia    1st degree heart block  . GERD (gastroesophageal reflux disease)   . Glaucoma   . Hiatal hernia   . Hyperlipidemia   . Hypertension   . Hypothyroidism   . Idiopathic thrombocytopenic purpura ([place]) 2002  . Macular degeneration   . Nephrolithiasis   . PUD (peptic ulcer disease)    remote  . Sarcoidosis    pulmonary  . Schatzki's ring     Past Surgical History:  Procedure Laterality Date  . cardiac stents    . COLONOSCOPY  10/30/2006   Normal rectum, sigmoid diverticula.Remainder of colonic  mucosa appeared normal.  . CORONARY ANGIOPLASTY WITH STENT PLACEMENT     about 10 years ago per pt (around 2007)  . CYSTOSCOPY WITH RETROGRADE PYELOGRAM, URETEROSCOPY AND STENT PLACEMENT Left 06/16/2017   Procedure: CYSTOSCOPY WITH RETROGRADE PYELOGRAM, URETEROSCOPY,STONE EXTRACTION  AND STENT PLACEMENT;  Surgeon: Franchot Gallo, MD;  Location: [facility];  Service: Urology;  Laterality: Left;  . ESOPHAGOGASTRODUODENOSCOPY  06/19/2004   Two esophageal rings and esophageal web as described above.  All of these were disrupted by passing  56-French Venia Minks dilator/ Candida esophagitis,which appears to be incidental given history of   antibiotic use, but nevertheless will be treated.  . ESOPHAGOGASTRODUODENOSCOPY  10/30/2006   Distal tandem esophageal ring status post dilation disruption as  described above.  Otherwise normal esophagus/  Small hiatal hernia otherwise normal stomach, D1 and D2  . ESOPHAGOGASTRODUODENOSCOPY N/A 03/22/2015   Dr.Rourk- noncritical schatzki's ring and hiatal hernia-o/w normal EGD.   Marland Kitchen ESOPHAGOGASTRODUODENOSCOPY (EGD) WITH ESOPHAGEAL DILATION  03/04/2012   RMR- schatzki's ring, hiatal hernia, polypoid gastric mucosa, bx= minimally active gastritis.  Marland Kitchen HOLMIUM LASER APPLICATION Left 0/81/4481   Procedure: HOLMIUM LASER APPLICATION;  Surgeon: Franchot Gallo, MD;  Location: [facility];  Service: Urology;  Laterality: Left;  . IR GENERIC HISTORICAL  03/06/2016   IR RADIOLOGIST EVAL & MGMT 03/06/2016 Aletta Edouard, MD [facility] INTERV RAD  . IR GENERIC HISTORICAL  06/18/2016   IR RADIOLOGIST EVAL & MGMT 06/18/2016 Aletta Edouard, MD [facility] INTERV RAD  . IR RADIOLOGIST EVAL & MGMT  10/01/2016  . IR RADIOLOGIST EVAL & MGMT  10/15/2017  . IR RADIOLOGIST EVAL & MGMT  12/24/2018  . IR RADIOLOGIST EVAL & MGMT  01/04/2020  . LEFT HEART CATH N/A 02/02/2012   Procedure: LEFT HEART CATH;  Surgeon: Lorretta Harp, MD;  Location: [facility] CATH LAB;  Service: Cardiovascular;  Laterality: N/A;  . MEDIASTINOSCOPY     for dx sarcoid  . RADIOLOGY WITH ANESTHESIA Left 05/17/2016   Procedure: left renal ablation;  Surgeon: Aletta Edouard, MD;  Location: [facility];  Service: Radiology;  Laterality: Left;    Home Medications:  Allergies as of 08/22/2020      Reactions   Azithromycin Other (See Comments)   Sore mouth and fever blisters around mouth, sores in nose area as well   Doxazosin Shortness Of Breath   Acetaminophen Other (See Comments)   REACTION: UNKNOWN REACTION   Atenolol Other (See Comments)   REACTION: UNKNOWN REACTION    Hydrocodone Nausea And Vomiting   Hydrocodone-acetaminophen Nausea Only   Levofloxacin Other (See Comments)   Caused stomach problems.   Morphine Other (See Comments)   "made me crazy"   Penicillins Nausea And Vomiting, Other (See Comments)   Has patient had a PCN reaction causing immediate rash, facial/tongue/throat swelling, SOB or lightheadedness with hypotension: No Has patient had a PCN reaction causing severe rash involving mucus membranes or skin necrosis: No Has patient had a PCN reaction that required hospitalization No Has patient had a PCN reaction occurring within the last 10 years: No If all of the above answers are "NO", then may proceed with Cephalosporin use.   Sulfonamide Derivatives Nausea And Vomiting      Medication List       Accurate as of August 22, 2020 12:19 PM. If you have any questions, ask your nurse or doctor.        aspirin 81 MG tablet Take 81 mg by mouth 3 (three) times a week.   atorvastatin 40 MG tablet  Commonly known as: LIPITOR TAKE 1 TABLET BY MOUTH IN  THE EVENING   brimonidine 0.2 % ophthalmic solution Commonly known as: ALPHAGAN Place 1 drop into both eyes 2 (two) times daily.   clopidogrel 75 MG tablet Commonly known as: PLAVIX TAKE 1 TABLET BY MOUTH  DAILY   dorzolamide 2 % ophthalmic solution Commonly known as: TRUSOPT Place 1 drop into both eyes 2 (two) times daily.   dorzolamide-timolol 22.3-6.8 MG/ML ophthalmic solution Commonly known as: COSOPT 1 drop 2 (two) times daily.   doxycycline 100 MG capsule Commonly known as: VIBRAMYCIN Take 100 mg by mouth 2 (two) times daily.   furosemide 40 MG tablet Commonly known as: LASIX TAKE 1 TABLET BY MOUTH  DAILY TAKE ONE-HALF TABLET  BY MOUTH ADDITIONAL AFTER  4:00 PM IF CONTINUED  SWELLING AS NEEDED   guaiFENesin 600 MG 12 hr tablet Commonly known as: MUCINEX Take 1,200 mg by mouth daily.   isosorbide mononitrate 60 MG 24 hr tablet Commonly known as: IMDUR TAKE 1.5 TABLETS  (90MG ) BY MOUTH IN  THE MORNING AND ONE-HALF TABLET (30MG ) BY MOUTH AT NIGHT.   levothyroxine 100 MCG tablet Commonly known as: SYNTHROID TAKE 1 TABLET BY MOUTH  DAILY   losartan 100 MG tablet Commonly known as: COZAAR TAKE 1 TABLET BY MOUTH  DAILY   metoprolol tartrate 25 MG tablet Commonly known as: LOPRESSOR TAKE 1 TABLET BY MOUTH IN  THE MORNING AND 1/2 TABLET  IN THE EVENING   mometasone 0.1 % ointment Commonly known as: ELOCON Apply 1 application topically daily.   multivitamin tablet Take 1 tablet by mouth every evening.   nitroGLYCERIN 0.4 MG SL tablet Commonly known as: NITROSTAT Place 1 tablet (0.4 mg total) under the tongue every 5 (five) minutes as needed. For chest pain   pantoprazole 40 MG tablet Commonly known as: PROTONIX TAKE 1 TABLET BY MOUTH  DAILY BEFORE BREAKFAST   potassium chloride SA 20 MEQ tablet Commonly known as: KLOR-CON TAKE 1 TABLET BY MOUTH  DAILY   PreserVision/Lutein Caps Take 1 capsule by mouth 2 (two) times daily.   Rocklatan 0.02-0.005 % Soln Generic drug: Netarsudil-Latanoprost Apply to eye.   timolol 0.5 % ophthalmic solution Commonly known as: TIMOPTIC Place 1 drop into both eyes daily.   triamcinolone cream 0.1 % Commonly known as: KENALOG Apply 1 application topically 2 (two) times daily as needed (for irritation).   vitamin C 500 MG tablet Commonly known as: ASCORBIC ACID Take 500 mg by mouth daily.   vitamin E 200 UNIT capsule Take 200 Units by mouth daily at 12 noon.       Allergies:  Allergies  Allergen Reactions  . Azithromycin Other (See Comments)    Sore mouth and fever blisters around mouth, sores in nose area as well  . Doxazosin Shortness Of Breath  . Acetaminophen Other (See Comments)    REACTION: UNKNOWN REACTION  . Atenolol Other (See Comments)    REACTION: UNKNOWN REACTION  . Hydrocodone Nausea And Vomiting  . Hydrocodone-Acetaminophen Nausea Only  . Levofloxacin Other (See Comments)     Caused stomach problems.  . Morphine Other (See Comments)    "made me crazy"  . Penicillins Nausea And Vomiting and Other (See Comments)    Has patient had a PCN reaction causing immediate rash, facial/tongue/throat swelling, SOB or lightheadedness with hypotension: No Has patient had a PCN reaction causing severe rash involving mucus membranes or skin necrosis: No Has patient had a PCN reaction that required hospitalization  No Has patient had a PCN reaction occurring within the last 10 years: No If all of the above answers are "NO", then may proceed with Cephalosporin use.   . Sulfonamide Derivatives Nausea And Vomiting    Family History  Problem Relation Age of Onset  . Heart disease Father        deceased age 42  . Stroke Mother   . Alzheimer's disease Mother   . Heart attack Brother        deceased age 13  . Cancer Other        niece  . Colon cancer Neg Hx     Social History:  reports that he quit smoking about 50 years ago. His smoking use included cigarettes and cigars. He has a 0.20 pack-year smoking history. He has never used smokeless tobacco. He reports that he does not drink alcohol and does not use drugs.  ROS: A complete review of systems was performed.  All systems are negative except for pertinent findings as noted.  Physical Exam:  Vital signs in last 24 hours: There were no vitals taken for this visit. Constitutional:  Alert and oriented, No acute distress Cardiovascular: Regular rate  Respiratory: Normal respiratory effort Neurologic: Grossly intact, no focal deficits Psychiatric: Normal mood and affect  I have reviewed prior pt notes  I have reviewed notes from referring/previous physicians  I have reviewed urinalysis results  I have independently reviewed prior imaging--CT results    Impression/Assessment:  1.  Renal cell carcinoma of left kidney, status post cryoablation in 2018.  No significant disease, patient doing well  2.  BPH, minimal  symptomatology.  Not currently on any medical therapy  3.  History of left ureteral stone, status post management, no evidence of any recurrent urolithiasis  Plan:  I will see back in 1 year for routine check

## 2020-08-22 NOTE — Progress Notes (Signed)

## 2020-08-23 LAB — URINALYSIS, ROUTINE W REFLEX MICROSCOPIC
Bilirubin, UA: NEGATIVE
Glucose, UA: NEGATIVE
Ketones, UA: NEGATIVE
Leukocytes,UA: NEGATIVE
Nitrite, UA: NEGATIVE
Protein,UA: NEGATIVE
RBC, UA: NEGATIVE
Specific Gravity, UA: 1.025 (ref 1.005–1.030)
Urobilinogen, Ur: 0.2 mg/dL (ref 0.2–1.0)
pH, UA: 5.5 (ref 5.0–7.5)

## 2020-09-02 DIAGNOSIS — I10 Essential (primary) hypertension: Secondary | ICD-10-CM | POA: Diagnosis not present

## 2020-09-02 DIAGNOSIS — E7849 Other hyperlipidemia: Secondary | ICD-10-CM | POA: Diagnosis not present

## 2020-09-04 ENCOUNTER — Telehealth: Payer: Self-pay

## 2020-09-04 ENCOUNTER — Other Ambulatory Visit: Payer: Self-pay | Admitting: Cardiovascular Disease

## 2020-09-04 ENCOUNTER — Telehealth: Payer: Self-pay | Admitting: Cardiovascular Disease

## 2020-09-04 NOTE — Telephone Encounter (Signed)
PT's son is returning [place] phone call.Please advise

## 2020-09-04 NOTE — Telephone Encounter (Signed)
Left message for pt son to call  

## 2020-09-04 NOTE — Telephone Encounter (Signed)
Pt c/o swelling: STAT is pt has developed SOB within 24 hours  1) How much weight have you gained and in what time span?  Patient's son states the patient has put on about 10 lbs over the past year, but he is not concerned with the weight gain  2) If swelling, where is the swelling located? Feet  3) Are you currently taking a fluid pill? Yes   4) Are you currently SOB? No   5) Do you have a log of your daily weights (if so, list)? No   6) Have you gained 3 pounds in a day or 5 pounds in a week? No   7) Have you traveled recently? No   Patient's son states the patient has also been having a hard time staying awake. He states this has been going on for about 1 year, but it has recently become progressively worse. He states the patient gets up with energy and makes breakfast every morning, but after making it he has a hard time staying awake. He'll sleep for a few hours and then wake up around noon, feeling energized again, He would like to know if any of his medications may be causing this and if anything needs to be adjusted. Please advise.

## 2020-09-04 NOTE — Telephone Encounter (Signed)
Left message for patients son to call back.

## 2020-09-04 NOTE — Telephone Encounter (Signed)
Pts son called saying he was not on any list to discuss fathers care. He wanted to know if a hernia his father is dealing with was discussed at last office visit here. I looked in chart and did not see anything about it discussed. I just advised son to come into office and get paper work done for [place] to be able to talk with him and advised him to take pt to PCP about hernia. Did not talk about anything in chart.

## 2020-09-06 ENCOUNTER — Other Ambulatory Visit (HOSPITAL_COMMUNITY): Payer: Self-pay | Admitting: Physician Assistant

## 2020-09-06 DIAGNOSIS — Z6820 Body mass index (BMI) 20.0-20.9, adult: Secondary | ICD-10-CM | POA: Diagnosis not present

## 2020-09-06 DIAGNOSIS — C642 Malignant neoplasm of left kidney, except renal pelvis: Secondary | ICD-10-CM | POA: Diagnosis not present

## 2020-09-06 DIAGNOSIS — R5383 Other fatigue: Secondary | ICD-10-CM | POA: Diagnosis not present

## 2020-09-06 DIAGNOSIS — E063 Autoimmune thyroiditis: Secondary | ICD-10-CM | POA: Diagnosis not present

## 2020-09-06 DIAGNOSIS — Z1331 Encounter for screening for depression: Secondary | ICD-10-CM | POA: Diagnosis not present

## 2020-09-06 DIAGNOSIS — K4090 Unilateral inguinal hernia, without obstruction or gangrene, not specified as recurrent: Secondary | ICD-10-CM

## 2020-09-07 NOTE — Telephone Encounter (Signed)
Per wife, patient has been staying up a lot during the night for the past month. Also reports memory issues such as forgetting to turn off water faucets and lights. Advised that metoprolol can cause fatigue but less likely causing him to be very sleepy during the day and more than likely would be from not sleeping well at night. Advised to keep up coming appointment with [name] on 09/27/20 for regular f/u visit and advised to contact PCP regarding insomnia and memory issues. Verbalized understanding of plan.

## 2020-09-12 ENCOUNTER — Other Ambulatory Visit: Payer: Self-pay

## 2020-09-12 MED ORDER — ISOSORBIDE MONONITRATE ER 60 MG PO TB24: ORAL_TABLET | ORAL | 1 refills | Status: DC

## 2020-09-19 ENCOUNTER — Telehealth: Payer: Self-pay | Admitting: Cardiovascular Disease

## 2020-09-19 NOTE — Telephone Encounter (Signed)
PT's wife is calling with concerns about her husband's memoery lost.She states that she has a strong feeling of whats going on but she wants a callback to discuss the issue

## 2020-09-19 NOTE — Telephone Encounter (Signed)
Attempted to call patient's wife x 3. Call is unable to go through.

## 2020-09-19 NOTE — Telephone Encounter (Signed)
Patient's wife states there is something not right with the patient. She states she does not want to say what they think it could be. She states he is not remembering anything and that he's a different person. She states she does not want to say anything to the patient, but wants [name] to be aware. She states she noticed he is just not thinking clearly. She states he is also staying up late every night up to midnight. She states she does not think it is his heart, but something is just not right. She states he also sleeps, and falls asleep in his cereal. She states she know he just turned 89, but is concerned. She states their son will be going to the patient's appointment with him, but she does not think he will tell it like she will.

## 2020-09-19 NOTE — Telephone Encounter (Signed)
Attempted to contact patient's wife x 3 but call would not go through.

## 2020-09-20 NOTE — Telephone Encounter (Signed)
I was able to get in touch with wife. She states that her husband is just not himself. She states he is sleeping all the time- and she believes it could be coming from the Metoprolol- she is unable to come to the appointment next week as she just had surgery, and she wants to make [facility] aware for the upcoming appointment on 09/27/2020. I did review stoke like symptoms with wife, and she denies those symptoms, they were advised if these symptoms come about they will call 911 and go to ED. Patient wife advised I would send a message over. She was thankful for call back.

## 2020-09-20 NOTE — Telephone Encounter (Signed)
Called patient wife- see previous telephone note.

## 2020-09-27 ENCOUNTER — Other Ambulatory Visit: Payer: Self-pay

## 2020-09-27 ENCOUNTER — Ambulatory Visit (INDEPENDENT_AMBULATORY_CARE_PROVIDER_SITE_OTHER): Payer: Medicare Other | Admitting: Cardiovascular Disease

## 2020-09-27 ENCOUNTER — Encounter: Payer: Self-pay | Admitting: Cardiovascular Disease

## 2020-09-27 ENCOUNTER — Telehealth: Payer: Self-pay | Admitting: Cardiovascular Disease

## 2020-09-27 VITALS — BP 138/60 | HR 70 | Ht 68.0 in | Wt 146.0 lb

## 2020-09-27 DIAGNOSIS — E039 Hypothyroidism, unspecified: Secondary | ICD-10-CM | POA: Diagnosis not present

## 2020-09-27 DIAGNOSIS — E785 Hyperlipidemia, unspecified: Secondary | ICD-10-CM

## 2020-09-27 DIAGNOSIS — I10 Essential (primary) hypertension: Secondary | ICD-10-CM

## 2020-09-27 DIAGNOSIS — I2510 Atherosclerotic heart disease of native coronary artery without angina pectoris: Secondary | ICD-10-CM | POA: Diagnosis not present

## 2020-09-27 DIAGNOSIS — R413 Other amnesia: Secondary | ICD-10-CM

## 2020-09-27 DIAGNOSIS — M25473 Effusion, unspecified ankle: Secondary | ICD-10-CM

## 2020-09-27 MED ORDER — FUROSEMIDE 20 MG PO TABS: 20.0000 mg | ORAL_TABLET | Freq: Every day | ORAL | 3 refills | Status: DC

## 2020-09-27 MED ORDER — NITROGLYCERIN 0.4 MG SL SUBL
0.4000 mg | SUBLINGUAL_TABLET | SUBLINGUAL | 2 refills | Status: DC | PRN
Start: 2020-09-27 — End: 2022-02-15

## 2020-09-27 NOTE — Telephone Encounter (Signed)
Pt c/o medication issue:  1. Name of Medication: potassium chloride SA (KLOR-CON) 20 MEQ tablet  2. How are you currently taking this medication (dosage and times per day)? As written  3. Are you having a reaction (difficulty breathing--STAT)?  No   4. What is your medication issue? Needs an immediate 30 day supply sent to the [facility] [phone] - [place], [address] AT [employer] [address] and needs a 3 month supply sent to [place], [address] [place], [address]

## 2020-09-27 NOTE — Telephone Encounter (Signed)
RN contacted [facility] spoke to pharmacist [place].  she states there is a  RX  Valid for potassium 20 meq. The patient had not called to have anew shipment mailed.  RN informed pharmacist that patient and wife would like shipment of potassium  mailed.    medication will be mailed   and wife notified.

## 2020-09-27 NOTE — Progress Notes (Signed)
Patient ID: Dale [name], male   DOB: August 25, 1928, 87 y.o.   MRN: [id number]    Primary M.D.: Dr. Redmond School  HPI: Dale [name] is a 87 y.o. male who presents to the office for an 73 month cardiology evaluation.  Dale [name] has known CAD and underwent initial intervention to his RCA in 2000. In 2009 a Cypher stent was placed beyond the acute margin of his RCA. His last cardiac catheterization was in September 2013 by Dr. [name] which showed 30% LAD narrowing after the second diagonal vessel, and normal left circumflex coronary artery, and patent RCA stents.  He has a history of hypothyroidism on Synthroid replacement and has a history of hyperlipidemia. He had been on on Crestor 5 mg plus Zetia 10 mg; however, due to recent increased cost of Zetia, this was discontinued and he has been on Crestor 20 mg daily.  He also has a history of GERD treated with Protonix, and he continues to be on dual antiplatelet therapy. He is responsive to Plavix on previous P2 Y12 testing.  When I saw him in 2016 he had experienced 3-4 episodes of chest pain over the six-month period.  Most of these episodes have occurred at night and were nitrate responsive.  He states that they were similar to his previous discomfort.  When I saw him, I further titrated his isosorbide mononitrate to 60 mg in the morning and 30 mg at night.   He saw [name], [place] in August 2018 with weight loss, weakness, and vague chest pain.  He felt his chest pain was atypical and not ischemic.  An echo Doppler study on 12/23/2016 showed an EF of 55-60%.  He was hospitalized on August 29 through January 03, 2017 at  [facility].  Apparently he had a nuclear stress test which did not reveal any reversible ischemia.  He was also felt to have acute sinusitis for which he was treated with antibiotics.  He has a history of renal cell CA.Marland Kitchen     When I saw him in October 2018, his chest pain had resolved. He underwent a carotid duplex study in October 2018  which showed mild plaque bilaterally at 1-39%. He denies increasing episodes of chest pain.  At times he notes rare chest discomfort which is short-lived.  He had developed some gross hematuria and was found to have a kidney stone which he underwent cystoscopy, left ureteral pyelogram with fluoroscopic interpretation, left ureteroscopy, holmium laser lithotripsy and extraction of left mid ureteral stone, placement of 24 x 6 French contour double-J stent, without tether by Dr [name] on 06/16/2017.  I saw him in March 2019.  Since that time, he has been seen by [name], PAC on 2 occasions in October.  He was seen initially on February 03, 2018 for bilateral ankle edema at which time his hydrochlorothiazide was switched to Lasix.  When seen in follow-up he continued to have ankle edema and Lasix was further titrated to 40 mg and potassium supplementation was increased.  I saw him in January 2020.  Several weeks prior to that evaluation he had experienced an episode of sharp chest ache which was nonexertional.    He denied recurrent anginal type symptoms.  He was sleepy during the day but admits that he is sleeping well at night.  He typically goes to bed at 11 PM and wakes up at 7 AM.    When I saw him in May 2020, he had noticed progressive lower extremity  leg edema for several weeks previously.  He apparently was not taking his Lasix in the morning but had been taking this at 5 PM.  He denied chest pain, awareness of abnormal heart rhythm, PND orthopnea.  During that evaluation, I suggested that he take his Lasix 40 mg in the morning and for the next several days take 20 mg in the later afternoon and if edema persisted to continue to take the Lasix perhaps every other day in the afternoon as well.  He states typically he does not take his Lasix in the morning but typically takes his 40 mg at lunchtime.  He then waits till suppertime around 7 PM to take an additional 40 mg of Lasix which he has been doing on  most days.  He urinates until 10 PM.  He has not been taking the Lasix as soon as he wakes up because then he states he cannot do anything without only he continues to experience ankle swelling bilaterally left greater than right.  He has not been using support stockings.  At times he may notice a mild arrhythmia.  He denied presyncope or syncope or recurrent anginal symptoms.  During that evaluation, I recommended that he continue to use support stockings.  Due to his frequent PVCs noted on ECG I slightly titrated his metoprolol from 12.5 mg twice a day to 25 mg in the morning and 12.5 mg at night.    When I  saw him in November 2020 he felt improved from his July 2020 evaluation  with titration of beta-blocker therapy and noticed resolution of his previous sense of palpitations.  His peripheral edema has improved with reinstitution of support stockings.  He denied any recurrent anginal symptoms.  He denied any significant shortness of breath.  I last saw him in June 2021 at which time he admitted to vague chest pain which has been nitrate responsive.  His peripheral edema had improved with support stockings.  At that tim he denied any presyncope or syncope.  He has continued to be on DAPT with aspirin/Plavix.  He is on furosemide 40 mg, losartan 100 mg and metoprolol tartrate 25 mg in the morning and 12.5 mg in the evening.  He has been taking isosorbide 60 mg in the morning and 30 mg at night.  During that evaluation I recommended slight additional titration of isosorbide to 90 mg in the morning and 30 mg at night.  Apparently in September 2021 he developed a syncopal spell he was sitting in the chair.  His wife gave him a dose of nitroglycerin and he subsequently passed out.  Pressure on EMS arrival was 89/65, CBG 112.  He was given 40 of cc of IV fluid prior to ER arrival.  This was also the first day he had taken alfuzosin prescribed for his urination.  He had not taken his medicine again.  He was seen  by [name], PA in follow-up and was stable.  Patient presents today for follow-up evaluation.  The son believes that the patient may have run out of his furosemide January.  The patient is unaware of.  They have also noticed some short-term memory loss and were concerned of whether or not he had a stroke.  He specifically denies any weakness or focal neurologic symptoms.  He has been on aspirin/Plavix for DAPT, atorvastatin 40 mg for hyperlipidemia, isosorbide 90 mg in the morning, 30 mg at night, losartan 100 mg in the morning, metoprolol tartrate 25 mg in the  morning and 12.5 mg at night.  On his formulary it still says furosemide 40 mg daily, but the son believes he ran out of this.  The patient thinks he may still be taking it.  They present for evaluation.  Past Medical History:  Diagnosis Date  . Allergic rhinitis   . Anal fissure   . Anginal pain ([place])   . Asthma   . Cancer ([place])    skin  . Chest pain   . CHF (congestive heart failure) ([place])   . Coronary heart disease    s/p stenting. cath in 01/2012 noncritical occlusion  . Dysrhythmia    1st degree heart block  . GERD (gastroesophageal reflux disease)   . Glaucoma   . Hiatal hernia   . Hyperlipidemia   . Hypertension   . Hypothyroidism   . Idiopathic thrombocytopenic purpura ([place]) 2002  . Macular degeneration   . Nephrolithiasis   . PUD (peptic ulcer disease)    remote  . Sarcoidosis    pulmonary  . Schatzki's ring     Past Surgical History:  Procedure Laterality Date  . cardiac stents    . COLONOSCOPY  10/30/2006   Normal rectum, sigmoid diverticula.Remainder of colonic mucosa appeared normal.  . CORONARY ANGIOPLASTY WITH STENT PLACEMENT     about 10 years ago per pt (around 2007)  . CYSTOSCOPY WITH RETROGRADE PYELOGRAM, URETEROSCOPY AND STENT PLACEMENT Left 06/16/2017   Procedure: CYSTOSCOPY WITH RETROGRADE PYELOGRAM, URETEROSCOPY,STONE EXTRACTION  AND STENT PLACEMENT;  Surgeon: Franchot Gallo, MD;  Location:  [facility];  Service: Urology;  Laterality: Left;  . ESOPHAGOGASTRODUODENOSCOPY  06/19/2004   Two esophageal rings and esophageal web as described above.  All of these were disrupted by passing 56-French Venia Minks dilator/ Candida esophagitis,which appears to be incidental given history of   antibiotic use, but nevertheless will be treated.  . ESOPHAGOGASTRODUODENOSCOPY  10/30/2006   Distal tandem esophageal ring status post dilation disruption as  described above.  Otherwise normal esophagus/  Small hiatal hernia otherwise normal stomach, D1 and D2  . ESOPHAGOGASTRODUODENOSCOPY N/A 03/22/2015   Dr.Rourk- noncritical schatzki's ring and hiatal hernia-o/w normal EGD.   Marland Kitchen ESOPHAGOGASTRODUODENOSCOPY (EGD) WITH ESOPHAGEAL DILATION  03/04/2012   RMR- schatzki's ring, hiatal hernia, polypoid gastric mucosa, bx= minimally active gastritis.  Marland Kitchen HOLMIUM LASER APPLICATION Left 1/93/7902   Procedure: HOLMIUM LASER APPLICATION;  Surgeon: Franchot Gallo, MD;  Location: [facility];  Service: Urology;  Laterality: Left;  . IR GENERIC HISTORICAL  03/06/2016   IR RADIOLOGIST EVAL & MGMT 03/06/2016 Aletta Edouard, MD [facility] INTERV RAD  . IR GENERIC HISTORICAL  06/18/2016   IR RADIOLOGIST EVAL & MGMT 06/18/2016 Aletta Edouard, MD [facility] INTERV RAD  . IR RADIOLOGIST EVAL & MGMT  10/01/2016  . IR RADIOLOGIST EVAL & MGMT  10/15/2017  . IR RADIOLOGIST EVAL & MGMT  12/24/2018  . IR RADIOLOGIST EVAL & MGMT  01/04/2020  . LEFT HEART CATH N/A 02/02/2012   Procedure: LEFT HEART CATH;  Surgeon: Lorretta Harp, MD;  Location[facility] C[facility];  Service: Cardiovascular;  Laterality: N/A;  . MEDIASTINOSCOPY     for dx sarcoid  . RADIOLOGY WITH ANESTHESIA Left 05/17/2016   Procedure: left renal ablation;  Surgeon: Aletta Edouard, MD;  Location: [facility];  Service: Radiology;  Laterality: Left;    Allergies  Allergen Reactions  . Azithromycin Other (See Comments)    Sore mouth and fever blisters around mouth, sores in nose area as well  .  Doxazosin Shortness Of Breath  .  Acetaminophen Other (See Comments)    REACTION: UNKNOWN REACTION  . Atenolol Other (See Comments)    REACTION: UNKNOWN REACTION  . Hydrocodone Nausea And Vomiting  . Hydrocodone-Acetaminophen Nausea Only  . Levofloxacin Other (See Comments)    Caused stomach problems.  . Morphine Other (See Comments)    "made me crazy"  . Penicillins Nausea And Vomiting and Other (See Comments)    Has patient had a PCN reaction causing immediate rash, facial/tongue/throat swelling, SOB or lightheadedness with hypotension: No Has patient had a PCN reaction causing severe rash involving mucus membranes or skin necrosis: No Has patient had a PCN reaction that required hospitalization No Has patient had a PCN reaction occurring within the last 10 years: No If all of the above answers are "NO", then may proceed with Cephalosporin use.   . Sulfonamide Derivatives Nausea And Vomiting    Current Outpatient Medications  Medication Sig Dispense Refill  . aspirin 81 MG tablet Take 81 mg by mouth 3 (three) times a week.     Marland Kitchen atorvastatin (LIPITOR) 40 MG tablet TAKE 1 TABLET BY MOUTH IN  THE EVENING 90 tablet 3  . brimonidine (ALPHAGAN) 0.2 % ophthalmic solution Place 1 drop into both eyes 2 (two) times daily.    . clopidogrel (PLAVIX) 75 MG tablet TAKE 1 TABLET BY MOUTH  DAILY 90 tablet 1  . dorzolamide (TRUSOPT) 2 % ophthalmic solution Place 1 drop into both eyes 2 (two) times daily.    Marland Kitchen guaiFENesin (MUCINEX) 600 MG 12 hr tablet Take 1,200 mg by mouth daily.     . isosorbide mononitrate (IMDUR) 60 MG 24 hr tablet TAKE 1.5 TABLETS (90MG) BY MOUTH IN  THE MORNING AND ONE-HALF TABLET (30MG) BY MOUTH AT NIGHT. 180 tablet 1  . levothyroxine (SYNTHROID) 125 MCG tablet Take 125 mcg by mouth daily before breakfast.    . losartan (COZAAR) 100 MG tablet TAKE 1 TABLET BY MOUTH  DAILY 90 tablet 3  . metoprolol tartrate (LOPRESSOR) 25 MG tablet TAKE 1 TABLET BY MOUTH IN  THE MORNING AND 1/2  TABLET  IN THE EVENING 135 tablet 3  . mometasone (ELOCON) 0.1 % ointment Apply 1 application topically daily.     . Multiple Vitamin (MULTIVITAMIN) tablet Take 1 tablet by mouth every evening.    . Multiple Vitamins-Minerals (PRESERVISION/LUTEIN) CAPS Take 1 capsule by mouth 2 (two) times daily.    . pantoprazole (PROTONIX) 40 MG tablet TAKE 1 TABLET BY MOUTH  DAILY BEFORE BREAKFAST 90 tablet 3  . potassium chloride SA (KLOR-CON) 20 MEQ tablet TAKE 1 TABLET BY MOUTH  DAILY 90 tablet 3  . ROCKLATAN 0.02-0.005 % SOLN Apply to eye.    . timolol (TIMOPTIC) 0.5 % ophthalmic solution Place 1 drop into both eyes daily.    Marland Kitchen triamcinolone (KENALOG) 0.1 % cream Apply 1 application topically 2 (two) times daily as needed (for irritation).    . vitamin C (ASCORBIC ACID) 500 MG tablet Take 500 mg by mouth daily.    . vitamin E 200 UNIT capsule Take 200 Units by mouth daily at 12 noon.     . furosemide (LASIX) 20 MG tablet Take 1 tablet (20 mg total) by mouth daily. 90 tablet 3  . nitroGLYCERIN (NITROSTAT) 0.4 MG SL tablet Place 1 tablet (0.4 mg total) under the tongue every 5 (five) minutes as needed. For chest pain 25 tablet 2   No current facility-administered medications for this visit.    Social History   Socioeconomic  History  . Marital status: Married    Spouse name: Not on file  . Number of children: 1  . Years of education: Not on file  . Highest education level: Not on file  Occupational History  . 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 Smoking status: Former Smoker    Packs/day: 0.10    Years: 2.00    Pack years: 0.20    Types: Cigarettes, Cigars    Quit date: 05/06/1970    Years since quitting: 50.4  . Smokeless tobacco: Never Used  Vaping Use  . Vaping Use: Never used  Substance and Sexual Activity  . Alcohol use: No    Alcohol/week: 0.0 standard drinks  . Drug use: No  . Sexual activity: Never  Other Topics Concern   . Not on file  Social History Narrative  . Not on file   Social Determinants of Health   Financial Resource Strain: Not on file  Food Insecurity: Not on file  Transportation Needs: Not on file  Physical Activity: Not on file  Stress: Not on file  Social Connections: Not on file  Intimate Partner Violence: Not on file   Additional social history is notable that he is married and lives with his wife. He quit smoking over 40 years ago. Has one child. He remains active.  Family History  Problem Relation Age of Onset  . Heart disease Father        deceased age 59  . Stroke Mother   . Alzheimer's disease Mother   . Heart attack Brother        deceased age 3  . Cancer Other        niece  . Colon cancer Neg Hx    ROS General: Negative; No fevers, chills, or night sweats; Positive for weight loss HEENT: Positive for recent sinusitis.  No changes in vision., difficulty swallowing Pulmonary: Negative; No cough, wheezing, shortness of breath, hemoptysis Cardiovascular: See history of present illness GI: Negative; No nausea, vomiting, diarrhea, or abdominal pain GU: History of renal cell CA status post cryoablation; status post renal stone removal Musculoskeletal: Negative; no myalgias, joint pain, or weakness Hematologic/Oncology: Negative; no easy bruising, bleeding Endocrine: Positive for hypothyroidism; no diabetes Neuro: Negative; no changes in balance, headaches Skin: Negative; No rashes or skin lesions Psychiatric: Negative; No behavioral problems, depression Sleep: Negative; No snoring, daytime sleepiness, hypersomnolence, bruxism, restless legs, hypnogognic hallucinations, no cataplexy Other comprehensive 14 point system review is negative.   PE BP 138/60 (BP Location: Right Arm, Patient Position: Sitting, Cuff Size: Normal)   Pulse 70   Ht '5\' 8"'  (1.727 m)   Wt 146 lb (66.2 kg)   BMI 22.20 kg/m    Repeat blood pressure by me was 150/68  Wt Readings from Last 3  Encounters:  09/27/20 146 lb (66.2 kg)  08/22/20 149 lb (67.6 kg)  08/09/20 145 lb 3.2 oz (65.9 kg)   General: Alert, oriented, no distress.  Skin: normal turgor, no rashes, warm and dry HEENT: Normocephalic, atraumatic. Pupils equal round and reactive to light; sclera anicteric; extraocular muscles intact; Nose without nasal septal hypertrophy Mouth/Parynx benign; Mallinpatti scale 3 Neck: No JVD, no carotid bruits; normal carotid upstroke Lungs: clear to ausculatation and percussion; no wheezing or rales Chest wall: without tenderness to palpitation Heart: PMI not displaced, RRR, s1 s2 normal, 1/6 systolic murmur, no diastolic murmur, no rubs, gallops, thrills, or heaves Abdomen: soft,  nontender; no hepatosplenomehaly, BS+; abdominal aorta nontender and not dilated by palpation. Back: no CVA tenderness Pulses 2+ Musculoskeletal: full range of motion, normal strength, no joint deformities Extremities: 2+ ankle edema bilaterally; no clubbing cyanosis or edema, Homan's sign negative  Neurologic: grossly nonfocal; Cranial nerves grossly wnl Psychologic: Normal mood and affect  ECG (independently read by me): Normal sinus rhythm at 70, borderline first-degree AV block with a PR interval of 208 ms.  No ectopy.  June 2021 ECG (independently read by me): Normal sinus rhythm at 60 bpm, first-degree AV block.  PR nterval to 244 ms  March 18, 2019 ECG (independently read by me): Normal sinus rhythm at 62 bpm.  First-degree AV block with a parable 244 ms.  1 isolated PVC.  No ST segment change  July 2020 ECG (independently read by me):Sinus rhythm at 68 bpm with first-degree AV block ( PR interval 236 msec) and frequent unifocal PVCs  Sep 09, 2018 ECG (independently read by me): Sinus bradycardia with first-degree AV block with ventricular rate of 59 bpm.  PR interval 244 ms.  June 02, 2018 ECG (independently read by me): Sinus rhythm at 62 bpm with first-degree block with apparent of what  to 40 ms.  No significant ST-T changes.  July 24, 2017 ECG (independently read by me): normal sinus rhythm at 60 bpm with first-degree AV block.  PR interval 236 ms.  QTc interval normal.  No ectopy.  October 2018 ECG (independently read by me): Sinus bradycardia 50 bpm.  First degree AV block with a PR interval at 224 ms.  No syncope ST changes.  September 2017 ECG (independently read by me): Sinus bradycardia 59 bpm.  First-degree AV block with PR interval 240 ms.  No significant ST-T changes.  November 2016 ECG (independently read by me): Sinus bradycardia at 55 beats per minute with first-degree AV block.  PR interval 246 ms.  No significant ST-T changes.  May 2016 ECG (independently read by me): Sinus rhythm with first-degree heart block.  PR interval 226.  No ST segment changes.  February 2016 ECG (independently read by me): Sinus rhythm at 65 bpm.  Mild first-degree AV block with a PR interval 228 ms.  No significant ST segment changes.  December 2015 ECG (independently read by me): Sinus bradycardia 57 bpm.  Borderline first-degree AV block with a PR interval 216 daily.  Seconds.  QTc interval is normal at 373 ms.  June 2015 ECG (independently read by me): Sinus bradycardia 54 beats per minute.  First degree block with a PR interval of 232 ms.  No significant ST-T changes.  Prior ECG: Sinus bradycardia 54 beats per minute. PR interval 206 ms. QTc interval normal  LABS: BMP Latest Ref Rng & Units 01/12/2020 12/31/2019 10/26/2019  Glucose 70 - 99 mg/dL 107(H) - 96  BUN 8 - 23 mg/dL 29(H) - 25  Creatinine 0.61 - 1.24 mg/dL 0.95 1.00 1.07  BUN/Creat Ratio 10 - 24 - - 23  Sodium 135 - 145 mmol/L 136 - 139  Potassium 3.5 - 5.1 mmol/L 4.5 - 4.5  Chloride 98 - 111 mmol/L 105 - 104  CO2 22 - 32 mmol/L 24 - 25  Calcium 8.9 - 10.3 mg/dL 8.7(L) - 9.6   Hepatic Function Latest Ref Rng & Units 10/26/2019 06/12/2018 09/08/2015  Total Protein 6.0 - 8.5 g/dL 7.3 6.4 6.3  Albumin 3.6 - 4.6 g/dL 4.0  4.1 3.9  AST 0 - 40 IU/L '20 22 24  ' ALT 0 -  44 IU/L '21 23 20  ' Alk Phosphatase 48 - 121 IU/L 84 97 76  Total Bilirubin 0.0 - 1.2 mg/dL 0.5 0.3 0.7  Bilirubin, Direct - - - -   CBC Latest Ref Rng & Units 01/12/2020 10/26/2019 06/12/2018  WBC 4.0 - 10.5 K/uL 10.0 5.5 4.7  Hemoglobin 13.0 - 17.0 g/dL 10.2(L) 11.3(L) 10.8(L)  Hematocrit 39.0 - 52.0 % 31.7(L) 33.5(L) 31.3(L)  Platelets 150 - 400 K/uL 146(L) 196 189   Lab Results  Component Value Date   MCV 112.0 (H) 01/12/2020   MCV 107 (H) 10/26/2019   MCV 98 (H) 06/12/2018   Lab Results  Component Value Date   TSH 3.010 10/26/2019  No results found for: HGBA1C   Lipid Panel     Component Value Date/Time   CHOL 123 10/26/2019 1013   TRIG 107 10/26/2019 1013   HDL 41 10/26/2019 1013   CHOLHDL 3.0 10/26/2019 1013   CHOLHDL 2.6 01/02/2017 0612   VLDL 10 01/02/2017 0612   LDLCALC 62 10/26/2019 1013   RADIOLOGY: No results found.  IMPRESSION:  1. Coronary artery disease involving native coronary artery of native heart without angina pectoris   2. Essential hypertension   3. Hyperlipidemia LDL goal <70   4. Hypothyroidism, unspecified type   5. Ankle edema   6. Short-term memory loss     ASSESSMENT AND PLAN: Dale [name] is a 87 years old gentleman who has known CAD and underwent initial intervention to his RCA in 2000 and subsequent intervention in 2009. At last catheterization in 2013 his RCA stents were patent and he had mild 30% LAD narrowing.  He has had leg edema issues which resulted in changing his HCTZ to furosemide in the past.  Subsequently, his dose had been titrated to 40 mg and he was advised to wear support stockings which significantly improved his edema.  When last seen by me he had trivial ankle swelling.  He apparently had suffered a syncopal spell in September which may have been contributed by his urologic medication coupled with his taking a sublingual nitroglycerin while sitting in the chair.  He responded  to fluid.  Presently his angina has improved on his increased regimen of isosorbide 90 mg in the morning and 30 mg at night but he is run out of his sublingual nitroglycerin tablet.  We are not renewing this today.  He is here in the office with his son.  It appears according to the son that he may have run out of his furosemide since January.  On exam today he has 2+ ankle edema which suggest perhaps that he has run out of his furosemide although the patient believes he is taking this around dinnertime so he would not urinate throughout the day.  With the assumption that most likely he has run out of his furosemide I have renewed this at 20 mg daily and instructed that he take this in the morning.  If he continues to experience additional ankle swelling which is not resolved he can take an extra 20 mg in the afternoon.  He has had some short-term memory issues.  The family was concerned perhaps could have had a stroke.  He does not have any focal neurologic findings on exam.  I have suggested he follow-up with his primary MD, Dr. Gerarda Fraction with consideration for possible neurologic evaluation.  He continues to be on atorvastatin for hyperlipidemia and levothyroxine for hypothyroidism I will see him in 6 months for reevaluation or sooner  as needed.   Troy Sine, MD, [facility]  09/27/2020 9:40 AM

## 2020-09-27 NOTE — Telephone Encounter (Signed)
Late entry  Spoke to patient wife earlier today. She stated patient had been out of medication Potassium 20 meq. She stated she spoke to some one at a pharmacy or [facility] about medication - " they were out of medication" Wife could not tell the RN who gave her the information. RN had informed wife that patient should have a prescription at [facility] which e-sent in Jan 2022 that would be still good until 2023.   Wife states she did call [facility]. RN informed wife,RN will call to verify  If prescription was still valid.

## 2020-09-27 NOTE — Patient Instructions (Signed)
Medication Instructions:  Take furosemide (Lasix) 20 mg once daily (in the morning)  *If you need a refill on your cardiac medications before your next appointment, please call your pharmacy*  Follow-Up: At [facility], you and your health needs are our priority.  As part of our continuing mission to provide you with exceptional heart care, we have created designated Provider Care Teams.  These Care Teams include your primary Cardiologist (physician) and Advanced Practice Providers (APPs -  Physician Assistants and Nurse Practitioners) who all work together to provide you with the care you need, when you need it.  We recommend signing up for the patient portal called "MyChart".  Sign up information is provided on this After Visit Summary.  MyChart is used to connect with patients for Virtual Visits (Telemedicine).  Patients are able to view lab/test results, encounter notes, upcoming appointments, etc.  Non-urgent messages can be sent to your provider as well.   To learn more about what you can do with MyChart, go to [email or web address].    Your next appointment:   6 month(s)  The format for your next appointment:   In Person  Provider:   Shelva Majestic, MD   Other Instructions Follow up with Dr. Gerarda Fraction

## 2020-09-28 NOTE — Telephone Encounter (Signed)
Concerns addressed at [place] on 5/25

## 2020-09-30 ENCOUNTER — Telehealth: Payer: Self-pay | Admitting: Physician Assistant

## 2020-09-30 MED ORDER — POTASSIUM CHLORIDE CRYS ER 20 MEQ PO TBCR: 20.0000 meq | EXTENDED_RELEASE_TABLET | Freq: Every day | ORAL | 0 refills | Status: DC

## 2020-09-30 NOTE — Telephone Encounter (Addendum)
   The patient's son Dale [name] called the answering service after-hours today. Apparently there has been an issue getting his potassium rx from his mail order. See Dale [name]'s phone note 5/25. Dale [name] says the pharmacy called and told his mom that they do not have any in stock and it's not clear when they will be able to mail them. They are requesting 30 day supply be sent into [facility] on [address] in [place]. Last OV note and labs reviewed, KCl fill appears appropriate to continue so I sent in 30 day rx for previously prescribed 81meq once daily. Under most recent prescription software update to Epic I am not certain as to whether my prescription will cancel out Dale [name] prior prescription to [facility] back in 05/2020 for his years' supply so will forward to the NL triage team to check up on this on Tuesday to clarify the pharmacy's plans to get the refills to the patient via Dale [name]. Patient's son was appreciative and knows to call the office if they do not get resolution of the refill plan.  Dale Pitter, PA-C

## 2020-10-03 ENCOUNTER — Other Ambulatory Visit: Payer: Self-pay | Admitting: Physician Assistant

## 2020-10-03 DIAGNOSIS — I10 Essential (primary) hypertension: Secondary | ICD-10-CM | POA: Diagnosis not present

## 2020-10-03 DIAGNOSIS — E7849 Other hyperlipidemia: Secondary | ICD-10-CM | POA: Diagnosis not present

## 2020-10-03 NOTE — Telephone Encounter (Signed)
I called over to [facility], they have the potassium RX- they stated that normally it will not cancel out for the year that was sent in first. Patient should let us know if they do not receive the RX from [place]. Thanks!

## 2020-10-09 ENCOUNTER — Ambulatory Visit (HOSPITAL_COMMUNITY)
Admission: RE | Admit: 2020-10-09 | Discharge: 2020-10-09 | Disposition: A | Discharge status: Home / Self Care | Payer: Medicare Other | Source: Ambulatory Visit | Attending: Physician Assistant | Admitting: Physician Assistant

## 2020-10-09 DIAGNOSIS — K4090 Unilateral inguinal hernia, without obstruction or gangrene, not specified as recurrent: Secondary | ICD-10-CM | POA: Insufficient documentation

## 2020-10-16 ENCOUNTER — Ambulatory Visit (HOSPITAL_COMMUNITY)
Admission: RE | Admit: 2020-10-16 | Discharge: 2020-10-16 | Disposition: A | Discharge status: Home / Self Care | Payer: Medicare Other | Source: Ambulatory Visit | Attending: Internal Medicine | Admitting: Internal Medicine

## 2020-10-16 ENCOUNTER — Other Ambulatory Visit (HOSPITAL_COMMUNITY): Payer: Self-pay | Admitting: Internal Medicine

## 2020-10-16 ENCOUNTER — Other Ambulatory Visit: Payer: Self-pay

## 2020-10-16 DIAGNOSIS — E063 Autoimmune thyroiditis: Secondary | ICD-10-CM | POA: Diagnosis not present

## 2020-10-16 DIAGNOSIS — R413 Other amnesia: Secondary | ICD-10-CM | POA: Diagnosis not present

## 2020-10-16 DIAGNOSIS — K4090 Unilateral inguinal hernia, without obstruction or gangrene, not specified as recurrent: Secondary | ICD-10-CM | POA: Diagnosis not present

## 2020-10-16 DIAGNOSIS — I209 Angina pectoris, unspecified: Secondary | ICD-10-CM | POA: Diagnosis not present

## 2020-10-16 DIAGNOSIS — Z681 Body mass index (BMI) 19 or less, adult: Secondary | ICD-10-CM | POA: Diagnosis not present

## 2020-10-16 DIAGNOSIS — I10 Essential (primary) hypertension: Secondary | ICD-10-CM | POA: Diagnosis not present

## 2020-10-16 DIAGNOSIS — R079 Chest pain, unspecified: Secondary | ICD-10-CM

## 2020-10-16 DIAGNOSIS — E538 Deficiency of other specified B group vitamins: Secondary | ICD-10-CM | POA: Diagnosis not present

## 2020-10-16 DIAGNOSIS — M1991 Primary osteoarthritis, unspecified site: Secondary | ICD-10-CM | POA: Diagnosis not present

## 2020-10-16 DIAGNOSIS — R0600 Dyspnea, unspecified: Secondary | ICD-10-CM

## 2020-10-16 DIAGNOSIS — Z85528 Personal history of other malignant neoplasm of kidney: Secondary | ICD-10-CM | POA: Diagnosis not present

## 2020-10-16 DIAGNOSIS — R609 Edema, unspecified: Secondary | ICD-10-CM | POA: Diagnosis not present

## 2020-10-16 DIAGNOSIS — F0390 Unspecified dementia without behavioral disturbance: Secondary | ICD-10-CM | POA: Diagnosis not present

## 2020-10-16 DIAGNOSIS — D649 Anemia, unspecified: Secondary | ICD-10-CM | POA: Diagnosis not present

## 2020-10-16 DIAGNOSIS — N400 Enlarged prostate without lower urinary tract symptoms: Secondary | ICD-10-CM | POA: Diagnosis not present

## 2020-10-16 DIAGNOSIS — Z79899 Other long term (current) drug therapy: Secondary | ICD-10-CM | POA: Diagnosis not present

## 2020-10-17 ENCOUNTER — Telehealth: Payer: Self-pay | Admitting: Cardiovascular Disease

## 2020-10-17 NOTE — Telephone Encounter (Signed)
Returned call to patient's wife  Explained 09/27/20 note AVS said to take furosemide 20mg  daily but MD note said OK to take additional 20mg  QPM if needed for ankle swelling  Patient and son were under the impression he should be taking furosemide 20mg  twice daily  Wife said his ankle/feet swelling is persistent  Wife believes patient has been taking this medication twice daily since 09/27/20 visit  Patient saw PCP yesterday but she did not feel like PCP adequately assessed edema  She said patient told her he had lost 2-3lbs -- did not provide a daily log   She said patient wear compression stockings/socks regularly   Advised will send a message to [name] to advise, since patient has been taking furosemide 20mg  BID and still has swelling

## 2020-10-17 NOTE — Telephone Encounter (Signed)
Pt is calling in to see if her husbands dosage of furosemide had changed... as of now pt is taking one tablet by mouth daily his son is telling him that he should be taking two a day... please advise.

## 2020-10-19 DIAGNOSIS — I209 Angina pectoris, unspecified: Secondary | ICD-10-CM | POA: Diagnosis not present

## 2020-10-19 DIAGNOSIS — R079 Chest pain, unspecified: Secondary | ICD-10-CM | POA: Diagnosis not present

## 2020-10-19 DIAGNOSIS — D649 Anemia, unspecified: Secondary | ICD-10-CM | POA: Diagnosis not present

## 2020-10-19 DIAGNOSIS — E063 Autoimmune thyroiditis: Secondary | ICD-10-CM | POA: Diagnosis not present

## 2020-10-21 ENCOUNTER — Other Ambulatory Visit: Payer: Self-pay | Admitting: Cardiovascular Disease

## 2020-10-31 DIAGNOSIS — Z79899 Other long term (current) drug therapy: Secondary | ICD-10-CM | POA: Diagnosis not present

## 2020-10-31 DIAGNOSIS — R079 Chest pain, unspecified: Secondary | ICD-10-CM | POA: Diagnosis not present

## 2020-10-31 DIAGNOSIS — R609 Edema, unspecified: Secondary | ICD-10-CM | POA: Diagnosis not present

## 2020-11-02 DIAGNOSIS — I10 Essential (primary) hypertension: Secondary | ICD-10-CM | POA: Diagnosis not present

## 2020-11-02 DIAGNOSIS — E7849 Other hyperlipidemia: Secondary | ICD-10-CM | POA: Diagnosis not present

## 2020-11-11 ENCOUNTER — Other Ambulatory Visit: Payer: Self-pay | Admitting: Cardiovascular Disease

## 2020-11-14 ENCOUNTER — Other Ambulatory Visit: Payer: Self-pay | Admitting: Interventional Radiology

## 2020-11-14 DIAGNOSIS — N2889 Other specified disorders of kidney and ureter: Secondary | ICD-10-CM

## 2020-11-21 NOTE — Telephone Encounter (Signed)
If swelling is still present, can take Lasix 20 mg bid.

## 2020-11-22 DIAGNOSIS — Z08 Encounter for follow-up examination after completed treatment for malignant neoplasm: Secondary | ICD-10-CM | POA: Diagnosis not present

## 2020-11-22 DIAGNOSIS — B029 Zoster without complications: Secondary | ICD-10-CM | POA: Diagnosis not present

## 2020-11-22 DIAGNOSIS — D225 Melanocytic nevi of trunk: Secondary | ICD-10-CM | POA: Diagnosis not present

## 2020-11-22 DIAGNOSIS — Z85820 Personal history of malignant melanoma of skin: Secondary | ICD-10-CM | POA: Diagnosis not present

## 2020-11-22 DIAGNOSIS — Z1283 Encounter for screening for malignant neoplasm of skin: Secondary | ICD-10-CM | POA: Diagnosis not present

## 2020-11-22 DIAGNOSIS — C44319 Basal cell carcinoma of skin of other parts of face: Secondary | ICD-10-CM | POA: Diagnosis not present

## 2020-11-22 MED ORDER — FUROSEMIDE 20 MG PO TABS: 20.0000 mg | ORAL_TABLET | Freq: Two times a day (BID) | ORAL | 3 refills | Status: DC

## 2020-11-22 NOTE — Telephone Encounter (Signed)
Wife and pt made aware that MD is ok with pt taking lasix 20 mg BID. Med list updated to reflect change.

## 2020-11-30 ENCOUNTER — Ambulatory Visit
Admission: RE | Admit: 2020-11-30 | Discharge: 2020-11-30 | Disposition: A | Discharge status: Home / Self Care | Payer: Medicare Other | Source: Ambulatory Visit | Attending: Interventional Radiology | Admitting: Interventional Radiology

## 2020-11-30 ENCOUNTER — Other Ambulatory Visit: Payer: Self-pay

## 2020-11-30 DIAGNOSIS — N2889 Other specified disorders of kidney and ureter: Secondary | ICD-10-CM

## 2020-12-01 ENCOUNTER — Inpatient Hospital Stay (HOSPITAL_COMMUNITY): Payer: Medicare Other

## 2020-12-01 ENCOUNTER — Other Ambulatory Visit: Payer: Self-pay

## 2020-12-01 ENCOUNTER — Inpatient Hospital Stay (HOSPITAL_COMMUNITY): Payer: Medicare Other | Attending: Hematology and Oncology | Admitting: Hematology and Oncology

## 2020-12-01 ENCOUNTER — Encounter (HOSPITAL_COMMUNITY): Payer: Self-pay | Admitting: Hematology and Oncology

## 2020-12-01 VITALS — BP 133/50 | HR 69 | Resp 16 | Wt 133.4 lb

## 2020-12-01 DIAGNOSIS — D649 Anemia, unspecified: Secondary | ICD-10-CM | POA: Diagnosis not present

## 2020-12-01 DIAGNOSIS — D696 Thrombocytopenia, unspecified: Secondary | ICD-10-CM

## 2020-12-01 DIAGNOSIS — Z87891 Personal history of nicotine dependence: Secondary | ICD-10-CM | POA: Insufficient documentation

## 2020-12-01 DIAGNOSIS — D539 Nutritional anemia, unspecified: Secondary | ICD-10-CM | POA: Diagnosis not present

## 2020-12-01 DIAGNOSIS — I2510 Atherosclerotic heart disease of native coronary artery without angina pectoris: Secondary | ICD-10-CM | POA: Diagnosis not present

## 2020-12-01 LAB — COMPREHENSIVE METABOLIC PANEL
ALT: 22 U/L (ref 0–44)
AST: 22 U/L (ref 15–41)
Albumin: 3.2 g/dL — ABNORMAL LOW (ref 3.5–5.0)
Alkaline Phosphatase: 75 U/L (ref 38–126)
Anion gap: 4 — ABNORMAL LOW (ref 5–15)
BUN: 25 mg/dL — ABNORMAL HIGH (ref 8–23)
CO2: 26 mmol/L (ref 22–32)
Calcium: 8.9 mg/dL (ref 8.9–10.3)
Chloride: 104 mmol/L (ref 98–111)
Creatinine, Ser: 0.9 mg/dL (ref 0.61–1.24)
GFR, Estimated: >60 mL/min (ref >60)
Glucose, Bld: 100 mg/dL — ABNORMAL HIGH (ref 70–99)
Potassium: 3.8 mmol/L (ref 3.5–5.1)
Sodium: 134 mmol/L — ABNORMAL LOW (ref 135–145)
Total Bilirubin: 0.7 mg/dL (ref 0.3–1.2)
Total Protein: 8.4 g/dL — ABNORMAL HIGH (ref 6.5–8.1)

## 2020-12-01 LAB — CBC WITH DIFFERENTIAL/PLATELET
Band Neutrophils: 3 %
Basophils Absolute: 0 10*3/uL (ref 0.0–0.1)
Basophils Relative: 1 %
Eosinophils Absolute: 0.4 10*3/uL (ref 0.0–0.5)
Eosinophils Relative: 8 %
HCT: 26.4 % — ABNORMAL LOW (ref 39.0–52.0)
Hemoglobin: 8.5 g/dL — ABNORMAL LOW (ref 13.0–17.0)
Lymphocytes Relative: 41 %
Lymphs Abs: 2 10*3/uL (ref 0.7–4.0)
MCH: 37.8 pg — ABNORMAL HIGH (ref 26.0–34.0)
MCHC: 32.2 g/dL (ref 30.0–36.0)
MCV: 117.3 fL — ABNORMAL HIGH (ref 80.0–100.0)
Monocytes Absolute: 0.4 10*3/uL (ref 0.1–1.0)
Monocytes Relative: 9 %
Neutro Abs: 2 10*3/uL (ref 1.7–7.7)
Neutrophils Relative %: 38 %
Platelets: 134 10*3/uL — ABNORMAL LOW (ref 150–400)
RBC: 2.25 MIL/uL — ABNORMAL LOW (ref 4.22–5.81)
RDW: 13.9 % (ref 11.5–15.5)
WBC: 4.9 10*3/uL (ref 4.0–10.5)
nRBC: 0 % (ref 0.0–0.2)

## 2020-12-01 LAB — RETICULOCYTES
Immature Retic Fract: 12.9 % (ref 2.3–15.9)
RBC.: 2.19 MIL/uL — ABNORMAL LOW (ref 4.22–5.81)
Retic Count, Absolute: 23 10*3/uL (ref 19.0–186.0)
Retic Ct Pct: 1.1 % (ref 0.4–3.1)

## 2020-12-01 LAB — LACTATE DEHYDROGENASE: LDH: 163 U/L (ref 98–192)

## 2020-12-01 NOTE — Progress Notes (Signed)
Dale [name] CONSULT NOTE  Patient Care Team: Redmond School, MD as PCP - General (Internal Medicine) Troy Sine, MD as PCP - Cardiology (Cardiology) Troy Sine, MD as Consulting Physician (Cardiology) Gala Romney Cristopher Estimable, MD as Consulting Physician (Gastroenterology)  CHIEF COMPLAINTS/PURPOSE OF CONSULTATION:   Macrocytic anemia, initial consultation ASSESSMENT & PLAN:   This is a very pleasant 87 year old male patient with coronary artery disease, congestive heart failure, macrocytic anemia referred to hematology for progressive macrocytic anemia and mild thrombocytopenia.  It appears that his baseline hemoglobin is about 10 g/dL.  He has no evidence of nutritional deficiency, normal iron levels, 123456 and folic acid levels.  No evidence of hemolysis.  No evidence of myeloma from CBC and CMP. His son was present at the time of the appointment today, since the patient is very hard of hearing, son had to explain everything to him. Physical examination, well-appearing 87 year old male patient with some skin abrasion given recent procedure by dermatology on the face and no definitive palpable pathologic lymphadenopathy.  No bilateral lower extremity edema.  We have discussed common causes of macrocytic anemia which includes medication, nutritional deficiencies, myelodysplasia etc.  Given his age and progressive macrocytic anemia along with mild thrombocytopenia, I do worry about possible myelodysplasia but I do not believe he is a candidate for hyper methylating agents or other aggressive treatments.  He may benefit from supportive care such as blood transfusion and growth factors.  I have recommended that the son, the patient and the wife discuss about the goals of care and return to clinic for follow-up in a couple weeks to discuss lab results and to review any additional recommendations.  We have also discussed briefly about bone marrow aspiration and biopsy, procedure and the  importance of it. Thank you for consulting [place] in the care of this patient.  Please not hesitate to contact us with any additional questions or concerns.   HISTORY OF PRESENTING ILLNESS:  Dale [name] 87 y.o. male is here because of anemia.  This is a very pleasant 87 year old male patient with coronary artery disease, congestive heart failure, hypertension, dyslipidemia referred to hematology for progressive anemia and mild thrombocytopenia.  He arrived to the appointment today with his son.  He is very hard of hearing and his son has to explain to him our conversation.  Son was also concerned about some possible dementia and memory loss. According to the patient, he was following up with hematology many years ago at [facility] for similar complaints and had a bone marrow aspiration and biopsy but he could not remember anything else.  Son thinks he may have taken some medication at that time but again we do not have clear history on this.  Currently the patient does not really complain of anything except for some shortness of breath with exertion, intermittent chest pain and hand cramps.  His appetite is great.  His energy levels are about 70%.  He denies any bone pains today.  He was evaluated for possible nutritional deficiency by his doctor, no evidence of iron, 123456 or folic acid deficiency.  No creatinine impairment noted.  Rest of the pertinent 10 point ROS reviewed and negative.   MEDICAL HISTORY:  Past Medical History:  Diagnosis Date   Allergic rhinitis    Anal fissure    Anginal pain (HCC)    Asthma    Cancer (HCC)    skin   Chest pain    CHF (congestive heart failure) (  [place])    Coronary heart disease    s/p stenting. cath in 01/2012 noncritical occlusion   Dysrhythmia    1st degree heart block   GERD (gastroesophageal reflux disease)    Glaucoma    Hiatal hernia    Hyperlipidemia    Hypertension    Hypothyroidism    Idiopathic thrombocytopenic purpura ([place]) 2002    Macular degeneration    Nephrolithiasis    PUD (peptic ulcer disease)    remote   Sarcoidosis    pulmonary   Schatzki's ring     SURGICAL HISTORY: Past Surgical History:  Procedure Laterality Date   cardiac stents     COLONOSCOPY  10/30/2006   Normal rectum, sigmoid diverticula.Remainder of colonic mucosa appeared normal.   CORONARY ANGIOPLASTY WITH STENT PLACEMENT     about 10 years ago per pt (around 2007)   [place], URETEROSCOPY AND STENT PLACEMENT Left 06/16/2017   Procedure: CYSTOSCOPY WITH RETROGRADE PYELOGRAM, URETEROSCOPY,STONE EXTRACTION  AND STENT PLACEMENT;  Surgeon: Franchot Gallo, MD;  Location: [facility];  Service: Urology;  Laterality: Left;   ESOPHAGOGASTRODUODENOSCOPY  06/19/2004   Two esophageal rings and esophageal web as described above.  All of these were disrupted by passing 56-French Venia Minks dilator/ Candida esophagitis,which appears to be incidental given history of   antibiotic use, but nevertheless will be treated.   ESOPHAGOGASTRODUODENOSCOPY  10/30/2006   Distal tandem esophageal ring status post dilation disruption as  described above.  Otherwise normal esophagus/  Small hiatal hernia otherwise normal stomach, D1 and D2   ESOPHAGOGASTRODUODENOSCOPY N/A 03/22/2015   Dr.Rourk- noncritical schatzki's ring and hiatal hernia-o/w normal EGD.    ESOPHAGOGASTRODUODENOSCOPY (EGD) WITH ESOPHAGEAL DILATION  03/04/2012   RMR- schatzki's ring, hiatal hernia, polypoid gastric mucosa, bx= minimally active gastritis.   HOLMIUM LASER APPLICATION Left [id number]   Procedure: HOLMIUM LASER APPLICATION;  Surgeon: Franchot Gallo, MD;  Location: [facility];  Service: Urology;  Laterality: Left;   IR GENERIC HISTORICAL  03/06/2016   IR RADIOLOGIST EVAL & MGMT 03/06/2016 Aletta Edouard, MD [facility] INTERV RAD   IR GENERIC HISTORICAL  06/18/2016   IR RADIOLOGIST EVAL & MGMT 06/18/2016 Aletta Edouard, MD [facility] INTERV RAD   IR RADIOLOGIST EVAL & MGMT   10/01/2016   IR RADIOLOGIST EVAL & MGMT  10/15/2017   IR RADIOLOGIST EVAL & MGMT  12/24/2018   IR RADIOLOGIST EVAL & MGMT  01/04/2020   LEFT HEART CATH N/A 02/02/2012   Procedure: LEFT HEART CATH;  Surgeon: Lorretta Harp, MD;  Location: [facility] CATH LAB;  Service: Cardiovascular;  Laterality: N/A;   MEDIASTINOSCOPY     for dx sarcoid   RADIOLOGY WITH ANESTHESIA Left 05/17/2016   Procedure: left renal ablation;  Surgeon: Aletta Edouard, MD;  Location: [facility];  Service: Radiology;  Laterality: Left;    SOCIAL HISTORY: 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    Employer: RETIRED  Tobacco Use   Smoking status: Former    Packs/day: 0.10    Years: 2.00    Pack years: 0.20    Types: Cigarettes, Cigars    Quit date: 05/06/1970    Years since quitting: 50.6   Smokeless tobacco: Never  Vaping Use   Vaping  Use: Never used  Substance and Sexual Activity   Alcohol use: No    Alcohol/week: 0.0 standard drinks   Drug use: No   Sexual activity: Never  Other Topics Concern   Not on file  Social History Narrative   Not on file   Social Determinants of Health   Financial Resource Strain: Not on file  Food Insecurity: Not on file  Transportation Needs: Not on file  Physical Activity: Not on file  Stress: Not on file  Social Connections: Not on file  Intimate Partner Violence: Not on file    FAMILY HISTORY: Family History  Problem Relation Age of Onset   Heart disease Father        deceased age 55   Stroke Mother    Alzheimer's disease Mother    Heart attack Brother        deceased age 58   Cancer Other        niece   Colon cancer Neg Hx     ALLERGIES:  is allergic to azithromycin, doxazosin, acetaminophen, atenolol, hydrocodone,  hydrocodone-acetaminophen, levofloxacin, morphine, penicillins, and sulfonamide derivatives.  MEDICATIONS:  Current Outpatient Medications  Medication Sig Dispense Refill   aspirin 81 MG tablet Take 81 mg by mouth 3 (three) times a week.      atorvastatin (LIPITOR) 40 MG tablet TAKE 1 TABLET BY MOUTH IN  THE EVENING 90 tablet 3   brimonidine (ALPHAGAN) 0.2 % ophthalmic solution Place 1 drop into both eyes 2 (two) times daily.     clopidogrel (PLAVIX) 75 MG tablet TAKE 1 TABLET BY MOUTH  DAILY 90 tablet 1   donepezil (ARICEPT) 5 MG tablet Take 5 mg by mouth daily.     dorzolamide-timolol (COSOPT) 22.3-6.8 MG/ML ophthalmic solution Place 1 drop into both eyes 2 (two) times daily.     furosemide (LASIX) 20 MG tablet Take 1 tablet (20 mg total) by mouth 2 (two) times daily. 180 tablet 3   guaiFENesin (MUCINEX) 600 MG 12 hr tablet Take 1,200 mg by mouth daily.      isosorbide mononitrate (IMDUR) 60 MG 24 hr tablet TAKE 1.5 TABLETS ('90MG'$ ) BY MOUTH IN  THE MORNING AND ONE-HALF TABLET ('30MG'$ ) BY MOUTH AT NIGHT. (Patient taking differently: Take 120 mg by mouth daily. TABLETS ONE-Half tablet ('60MG'$ ) BY MOUTH IN  THE MORNING AND ONE-HALF TABLET ('60MG'$ ) BY MOUTH AT NIGHT.) 180 tablet 1   levothyroxine (SYNTHROID) 125 MCG tablet Take 125 mcg by mouth daily before breakfast.     losartan (COZAAR) 100 MG tablet TAKE 1 TABLET BY MOUTH  DAILY 90 tablet 3   metoprolol tartrate (LOPRESSOR) 25 MG tablet TAKE 1 TABLET BY MOUTH IN  THE MORNING AND 1/2 TABLET  IN THE EVENING 135 tablet 3   mometasone (ELOCON) 0.1 % ointment Apply 1 application topically daily.      Multiple Vitamin (MULTIVITAMIN) tablet Take 1 tablet by mouth every evening.     Multiple Vitamins-Minerals (PRESERVISION/LUTEIN) CAPS Take 1 capsule by mouth 2 (two) times daily.     nitroGLYCERIN (NITROSTAT) 0.4 MG SL tablet Place 1 tablet (0.4 mg total) under the tongue every 5 (five) minutes as needed. For chest pain 25 tablet 2   pantoprazole (PROTONIX)  40 MG tablet TAKE 1 TABLET BY MOUTH  DAILY BEFORE BREAKFAST 90 tablet 3   potassium chloride SA (KLOR-CON) 20 MEQ tablet Take 1 tablet (20 mEq total) by mouth daily. 30 tablet 0   ROCKLATAN 0.02-0.005 % SOLN Apply to eye.  triamcinolone (KENALOG) 0.1 % cream Apply 1 application topically 2 (two) times daily as needed (for irritation).     valACYclovir (VALTREX) 1000 MG tablet Take 1,000 mg by mouth 2 (two) times daily.     vitamin C (ASCORBIC ACID) 500 MG tablet Take 500 mg by mouth daily.     vitamin E 200 UNIT capsule Take 200 Units by mouth daily at 12 noon.      No current facility-administered medications for this visit.    PHYSICAL EXAMINATION: ECOG PERFORMANCE STATUS: 0 - Asymptomatic  Vitals:   12/01/20 1248  BP: (!) 133/50  Pulse: 69  Resp: 16  SpO2: 99%   Filed Weights   12/01/20 1248  Weight: 133 lb 6.4 oz (60.5 kg)    GENERAL:alert, no distress and comfortable SKIN: skin color, texture, turgor are normal, no rashes or significant lesions EYES: normal, conjunctiva are pink and non-injected, sclera clear OROPHARYNX:no exudate, no erythema and lips, buccal mucosa, and tongue normal  NECK: supple, thyroid normal size, non-tender, without nodularity LYMPH:  no palpable lymphadenopathy in the cervical, axillary, he does point to some palpable tenderness in the left lower cervical region, no definitive pathological lymphadenopathy, small physiologic lymph nodes noted. LUNGS: clear to auscultation and percussion with normal breathing effort HEART: regular rate & rhythm and no murmurs and no lower extremity edema ABDOMEN: Not examined, patient in sitting position. Musculoskeletal:no cyanosis of digits and no clubbing  PSYCH: alert & oriented x 3 with fluent speech NEURO: no focal motor/sensory deficits  LABORATORY DATA:  I have reviewed the data as listed Lab Results  Component Value Date   WBC 10.0 01/12/2020   HGB 10.2 (L) 01/12/2020   HCT 31.7 (L) 01/12/2020    MCV 112.0 (H) 01/12/2020   PLT 146 (L) 01/12/2020     Chemistry      Component Value Date/Time   NA 136 01/12/2020 1348   NA 139 10/26/2019 1013   K 4.5 01/12/2020 1348   CL 105 01/12/2020 1348   CO2 24 01/12/2020 1348   BUN 29 (H) 01/12/2020 1348   BUN 25 10/26/2019 1013   CREATININE 0.95 01/12/2020 1348      Component Value Date/Time   CALCIUM 8.7 (L) 01/12/2020 1348   ALKPHOS 84 10/26/2019 1013   AST 20 10/26/2019 1013   ALT 21 10/26/2019 1013   BILITOT 0.5 10/26/2019 1013      I have reviewed pertinent labs over the past several years.  He has had anemia for several years, macrocytic.  His baseline hemoglobin is about 10 g/dL however his most recent hemoglobin was around 8-1/2 g/dL.  He was also found to have mild thrombocytopenia which again was present last year as well.  No evidence of iron, 123456 or folic acid deficiency.  His creatinine was normal and there was no concern for myeloma on the labs. His CBC from today showed hemoglobin of 8.5 g/dL, MCV of 117, platelet count of 134,000 and normal white blood cell count.  CMP showed normal creatinine, no evidence of hypercalcemia or elevated alkaline phosphatase LDH and reticulocyte count is normal.  Rest of the labs are pending RADIOGRAPHIC STUDIES: I have personally reviewed the radiological images as listed and agreed with the findings in the report. No results found.  All questions were answered. The patient knows to call the clinic with any problems, questions or concerns. I spent 45 minutes in the care of this patient including H and P, review of records, counseling and coordination of  care.     Benay Pike, MD 12/01/2020 1:20 PM

## 2020-12-02 LAB — IGG, IGA, IGM
IgA: 44 mg/dL — ABNORMAL LOW (ref 61–437)
IgG (Immunoglobin G), Serum: 3970 mg/dL — ABNORMAL HIGH (ref 603–1613)
IgM (Immunoglobulin M), Srm: 23 mg/dL (ref 15–143)

## 2020-12-04 LAB — KAPPA/LAMBDA LIGHT CHAINS
Kappa free light chain: 12.3 mg/L (ref 3.3–19.4)
Kappa, lambda light chain ratio: 0.04 — ABNORMAL LOW (ref 0.26–1.65)
Lambda free light chains: 284.5 mg/L — ABNORMAL HIGH (ref 5.7–26.3)

## 2020-12-05 LAB — PROTEIN ELECTROPHORESIS, SERUM
A/G Ratio: 0.8 (ref 0.7–1.7)
Albumin ELP: 3.6 g/dL (ref 2.9–4.4)
Alpha-1-Globulin: 0.2 g/dL (ref 0.0–0.4)
Alpha-2-Globulin: 0.7 g/dL (ref 0.4–1.0)
Beta Globulin: 0.8 g/dL (ref 0.7–1.3)
Gamma Globulin: 3 g/dL — ABNORMAL HIGH (ref 0.4–1.8)
Globulin, Total: 4.7 g/dL — ABNORMAL HIGH (ref 2.2–3.9)
M-Spike, %: 2.9 g/dL — ABNORMAL HIGH
Total Protein ELP: 8.3 g/dL (ref 6.0–8.5)

## 2020-12-06 NOTE — Progress Notes (Signed)
Dale [name], Dale [name]   CLINIC:  Medical Oncology/Hematology  PCP:  [facility], [place] / [place] [address]  [phone]  REASON FOR VISIT:  Follow-up for anemia  PRIOR THERAPY: none  CURRENT THERAPY: surveillance  INTERVAL HISTORY:  Dale [name], a 87 y.o. male, returns for routine follow-up for his anemia. Dale [name] was last seen on 12/01/20 by Dr. Benay Pike.  Today he reports feeling well, and he is accompanied by his son. His son reports he had a bone marrow aspiration in [place] 30 years ago, but they cannot recall the results at this time. His son reports that he bleeds easily and one episode of dark stools. He complains of a headache in the back of his head as well as episodes of confusion beginning 2-3 months ago. He has difficulty with short term memory.   He currently lives at home with his wife where he is capable of doing all of his normal home activities including yard work. Prior to retirement he worked for a [employer], and he cannot recall if he had any excess chemical exposure. He used to smoke, but quit prior to the birth of his son.  Review of Systems  Constitutional:  Positive for fatigue (50%). Negative for appetite change (80%).  HENT:   Positive for trouble swallowing.   Neurological:  Positive for headaches (back of head).  Hematological:  Bruises/bleeds easily.  Psychiatric/Behavioral:  Positive for confusion.   All other systems reviewed and are negative.  PAST MEDICAL/SURGICAL HISTORY:  Past Medical History:  Diagnosis Date   Allergic rhinitis    Anal fissure    Anginal pain (HCC)    Asthma    Cancer (HCC)    skin   Chest pain    CHF (congestive heart failure) ([place])    Coronary heart disease    s/p stenting. cath in 01/2012 noncritical occlusion   Dysrhythmia    1st degree heart block   GERD (gastroesophageal reflux disease)    Glaucoma    Hiatal hernia     Hyperlipidemia    Hypertension    Hypothyroidism    Idiopathic thrombocytopenic purpura ([place]) 2002   Macular degeneration    Nephrolithiasis    PUD (peptic ulcer disease)    remote   Sarcoidosis    pulmonary   Schatzki's ring    Past Surgical History:  Procedure Laterality Date   cardiac stents     COLONOSCOPY  10/30/2006   Normal rectum, sigmoid diverticula.Remainder of colonic mucosa appeared normal.   CORONARY ANGIOPLASTY WITH STENT PLACEMENT     about 10 years ago per pt (around 2007)   [place], URETEROSCOPY AND STENT PLACEMENT Left 06/16/2017   Procedure: CYSTOSCOPY WITH RETROGRADE PYELOGRAM, URETEROSCOPY,STONE EXTRACTION  AND STENT PLACEMENT;  Surgeon: Franchot Gallo, MD;  [facility];  Service: Urology;  Laterality: Left;   ESOPHAGOGASTRODUODENOSCOPY  06/19/2004   Two esophageal rings and esophageal web as described above.  All of these were disrupted by passing 56-French Venia Minks dilator/ Candida esophagitis,which appears to be incidental given history of   antibiotic use, but nevertheless will be treated.   ESOPHAGOGASTRODUODENOSCOPY  10/30/2006   Distal tandem esophageal ring status post dilation disruption as  described above.  Otherwise normal esophagus/  Small hiatal hernia otherwise normal stomach, D1 and D2   ESOPHAGOGASTRODUODENOSCOPY N/A 03/22/2015   Dr.Rourk- noncritical schatzki's ring and hiatal hernia-o/w normal EGD.    ESOPHAGOGASTRODUODENOSCOPY (EGD)  WITH ESOPHAGEAL DILATION  03/04/2012   RMR- schatzki's ring, hiatal hernia, polypoid gastric mucosa, bx= minimally active gastritis.   HOLMIUM LASER APPLICATION Left 0/11/1217   Procedure: HOLMIUM LASER APPLICATION;  Surgeon: Franchot Gallo, MD;  [facility];  Service: Urology;  Laterality: Left;   IR GENERIC HISTORICAL  03/06/2016   IR RADIOLOGIST EVAL & MGMT 03/06/2016 Aletta Edouard, MD [facility] INTERV RAD   IR GENERIC HISTORICAL  06/18/2016   IR RADIOLOGIST EVAL & MGMT  06/18/2016 Aletta Edouard, MD [facility] INTERV RAD   IR RADIOLOGIST EVAL & MGMT  10/01/2016   IR RADIOLOGIST EVAL & MGMT  10/15/2017   IR RADIOLOGIST EVAL & MGMT  12/24/2018   IR RADIOLOGIST EVAL & MGMT  01/04/2020   LEFT HEART CATH N/A 02/02/2012   Procedure: LEFT HEART CATH;  Surgeon: Lorretta Harp, MD;  Location: [facility] CATH LAB;  Service: Cardiovascular;  Laterality: N/A;   MEDIASTINOSCOPY     for dx sarcoid   RADIOLOGY WITH ANESTHESIA Left 05/17/2016   Procedure: left renal ablation;  Surgeon: Aletta Edouard, MD;  [facility];  Service: Radiology;  Laterality: Left;    SOCIAL HISTORY:  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    Employer: RETIRED  Tobacco Use   Smoking status: Former    Packs/day: 0.10    Years: 2.00    Pack years: 0.20    Types: Cigarettes, Cigars    Quit date: 05/06/1970    Years since quitting: 84.6   Smokeless tobacco: Never  Vaping Use   Vaping Use: Never used  Substance and Sexual Activity   Alcohol use: No    Alcohol/week: 0.0 standard drinks   Drug use: No   Sexual activity: Never  Other Topics Concern   Not on file  Social History Narrative   Not on file   Social Determinants of Health   Financial Resource Strain: Not on file  Food Insecurity: Not on file  Transportation Needs: Not on file  Physical Activity: Not on file  Stress: Not on file  Social Connections: Not on file  Intimate Partner Violence: Not on file    FAMILY HISTORY:  Family History  Problem Relation Age of Onset   Heart disease Father        deceased age 85   Stroke Mother    Alzheimer's disease Mother    Heart attack Brother        deceased age 48   Cancer Other        niece   Colon cancer Neg Hx     CURRENT MEDICATIONS:   Current Outpatient Medications  Medication Sig Dispense Refill   aspirin 81 MG tablet Take 81 mg by mouth 3 (three) times a week.      atorvastatin (LIPITOR) 40 MG tablet TAKE 1 TABLET BY MOUTH IN  THE EVENING 90 tablet 3   brimonidine (ALPHAGAN) 0.2 % ophthalmic solution Place 1 drop into both eyes 2 (two) times daily.     clopidogrel (PLAVIX) 75 MG tablet TAKE 1 TABLET BY MOUTH  DAILY 90 tablet 1   donepezil (ARICEPT) 5 MG tablet Take 5 mg by mouth daily.     dorzolamide-timolol (COSOPT) 22.3-6.8 MG/ML ophthalmic solution  Place 1 drop into both eyes 2 (two) times daily.     furosemide (LASIX) 20 MG tablet Take 1 tablet (20 mg total) by mouth 2 (two) times daily. 180 tablet 3   guaiFENesin (MUCINEX) 600 MG 12 hr tablet Take 1,200 mg by mouth daily.      isosorbide mononitrate (IMDUR) 60 MG 24 hr tablet TAKE 1.5 TABLETS ($RemoveBefo'90MG'PIpYkizYpis$ ) BY MOUTH IN  THE MORNING AND ONE-HALF TABLET ($RemoveBeforeDE'30MG'fuuRUHODApXAGBa$ ) BY MOUTH AT NIGHT. (Patient taking differently: Take 120 mg by mouth daily. TABLETS ONE-Half tablet ($RemoveBeforeDE'60MG'gusUiVfeiCXUcbh$ ) BY MOUTH IN  THE MORNING AND ONE-HALF TABLET ($RemoveBeforeDE'60MG'aXhlEcxBpBWtdvz$ ) BY MOUTH AT NIGHT.) 180 tablet 1   levothyroxine (SYNTHROID) 125 MCG tablet Take 125 mcg by mouth daily before breakfast.     losartan (COZAAR) 100 MG tablet TAKE 1 TABLET BY MOUTH  DAILY 90 tablet 3   metoprolol tartrate (LOPRESSOR) 25 MG tablet TAKE 1 TABLET BY MOUTH IN  THE MORNING AND 1/2 TABLET  IN THE EVENING 135 tablet 3   mometasone (ELOCON) 0.1 % ointment Apply 1 application topically daily.      Multiple Vitamin (MULTIVITAMIN) tablet Take 1 tablet by mouth every evening.     Multiple Vitamins-Minerals (PRESERVISION/LUTEIN) CAPS Take 1 capsule by mouth 2 (two) times daily.     nitroGLYCERIN (NITROSTAT) 0.4 MG SL tablet Place 1 tablet (0.4 mg total) under the tongue every 5 (five) minutes as needed. For chest pain 25 tablet 2   pantoprazole (PROTONIX) 40 MG tablet TAKE 1 TABLET BY MOUTH  DAILY BEFORE BREAKFAST 90 tablet 3   potassium chloride SA (KLOR-CON)  20 MEQ tablet Take 1 tablet (20 mEq total) by mouth daily. 30 tablet 0   ROCKLATAN 0.02-0.005 % SOLN Apply to eye.     triamcinolone (KENALOG) 0.1 % cream Apply 1 application topically 2 (two) times daily as needed (for irritation).     valACYclovir (VALTREX) 1000 MG tablet Take 1,000 mg by mouth 2 (two) times daily.     vitamin C (ASCORBIC ACID) 500 MG tablet Take 500 mg by mouth daily.     vitamin E 200 UNIT capsule Take 200 Units by mouth daily at 12 noon.      No current facility-administered medications for this visit.    ALLERGIES:  Allergies  Allergen Reactions   Azithromycin Other (See Comments)    Sore mouth and fever blisters around mouth, sores in nose area as well   Doxazosin Shortness Of Breath   Acetaminophen Other (See Comments)    REACTION: UNKNOWN REACTION   Atenolol Other (See Comments)    REACTION: UNKNOWN REACTION   Hydrocodone Nausea And Vomiting   Hydrocodone-Acetaminophen Nausea Only   Levofloxacin Other (See Comments)    Caused stomach problems.   Morphine Other (See Comments)    "made me crazy"   Penicillins Nausea And Vomiting and Other (See Comments)    Has patient had a PCN reaction causing immediate rash, facial/tongue/throat swelling, SOB or lightheadedness with hypotension: No Has patient had a PCN reaction causing severe rash involving mucus membranes or skin necrosis: No Has patient had a PCN reaction that required hospitalization No Has patient had a PCN reaction occurring within the last 10 years: No If all of the above answers are "NO", then may proceed with Cephalosporin use.    Sulfonamide Derivatives Nausea And Vomiting    PHYSICAL EXAM:  Performance status (ECOG): 0 - Asymptomatic  There were no vitals filed for this visit. Wt Readings from Last 3 Encounters:  12/01/20 133  lb 6.4 oz (60.5 kg)  09/27/20 146 lb (66.2 kg)  08/22/20 149 lb (67.6 kg)   Physical Exam Vitals reviewed.  Constitutional:      Appearance: Normal appearance.   Cardiovascular:     Rate and Rhythm: Normal rate and regular rhythm.     Pulses: Normal pulses.     Heart sounds: Normal heart sounds.  Pulmonary:     Effort: Pulmonary effort is normal.     Breath sounds: Normal breath sounds.  Neurological:     General: No focal deficit present.     Mental Status: He is alert and oriented to person, place, and time.  Psychiatric:        Mood and Affect: Mood normal.        Behavior: Behavior normal.    LABORATORY DATA:  I have reviewed the labs as listed.  CBC Latest Ref Rng & Units 12/01/2020 01/12/2020 10/26/2019  WBC 4.0 - 10.5 K/uL 4.9 10.0 5.5  Hemoglobin 13.0 - 17.0 g/dL 8.5(L) 10.2(L) 11.3(L)  Hematocrit 39.0 - 52.0 % 26.4(L) 31.7(L) 33.5(L)  Platelets 150 - 400 K/uL 134(L) 146(L) 196   CMP Latest Ref Rng & Units 12/01/2020 01/12/2020 12/31/2019  Glucose 70 - 99 mg/dL 100(H) 107(H) -  BUN 8 - 23 mg/dL 25(H) 29(H) -  Creatinine 0.61 - 1.24 mg/dL 0.90 0.95 1.00  Sodium 135 - 145 mmol/L 134(L) 136 -  Potassium 3.5 - 5.1 mmol/L 3.8 4.5 -  Chloride 98 - 111 mmol/L 104 105 -  CO2 22 - 32 mmol/L 26 24 -  Calcium 8.9 - 10.3 mg/dL 8.9 8.7(L) -  Total Protein 6.5 - 8.1 g/dL 8.4(H) - -  Total Bilirubin 0.3 - 1.2 mg/dL 0.7 - -  Alkaline Phos 38 - 126 U/L 75 - -  AST 15 - 41 U/L 22 - -  ALT 0 - 44 U/L 22 - -      Component Value Date/Time   RBC 2.25 (L) 12/01/2020 1419   RBC 2.19 (L) 12/01/2020 1419   MCV 117.3 (H) 12/01/2020 1419   MCV 107 (H) 10/26/2019 1013   MCH 37.8 (H) 12/01/2020 1419   MCHC 32.2 12/01/2020 1419   RDW 13.9 12/01/2020 1419   RDW 12.7 10/26/2019 1013   LYMPHSABS 2.0 12/01/2020 1419   LYMPHSABS CANCELED 09/08/2015 0933   MONOABS 0.4 12/01/2020 1419   EOSABS 0.4 12/01/2020 1419   EOSABS CANCELED 09/08/2015 0933   BASOSABS 0.0 12/01/2020 1419   BASOSABS CANCELED 09/08/2015 0933    DIAGNOSTIC IMAGING:  I have independently reviewed the scans and discussed with the patient. No results found.   ASSESSMENT:  1.   Monoclonal gammopathy: - Work-up for macrocytic anemia on 12/01/2020 showed M spike of 2.9 g. - Lambda light chains elevated at 284.  Light chain ratio was 0.04.  LDH was 163.  Creatinine was 0.9 and calcium 8.9.  2.  Macrocytic anemia: - CBC on 12/01/2020 with hemoglobin 8.5 and MCV of 117. - Denies any bleeding per rectum or melena.  3.  Social/family history: - Lives at home with his wife.  He does all ADLs and IADLs.  He even does yard work. - He worked at [employer] for 35 years.  Denies any chemical exposure.  Non-smoker. - No family history of malignancies.  PLAN:  1.  Monoclonal gammopathy: -I have reviewed labs from 12/01/2020. - They are indicative of monoclonal gammopathy with 2.9 g of M spike. - I have discussed the diagnosis of monoclonal gammopathy. -  I have recommended bone marrow aspiration and biopsy to evaluate for multiple myeloma.  We will also check chromosome analysis and myeloma FISH panel. - Would order metastatic skeletal survey to evaluate for lytic lesions.  He complains of occipital pain for the last 2 to 3 months. - We will also order serum immunofixation to identify the monoclonal protein.  We will also consider checking 24-hour urine with myeloma studies if he requires treatment. - RTC 2 weeks after the biopsy.  2.  Macrocytic anemia: - Likely etiology is plasma cell disorder.  Does not report any bleeding per rectum or melena. - Will check Z97, folic acid, methylmalonic acid and copper levels.    Orders placed this encounter:  No orders of the defined types were placed in this encounter.    Derek Jack, MD [place] [phone]   I, Thana Ates, am acting as a scribe for Dr. Derek Jack.  I, Derek Jack MD, have reviewed the above documentation for accuracy and completeness, and I agree with the above.

## 2020-12-07 ENCOUNTER — Ambulatory Visit (HOSPITAL_COMMUNITY)
Admission: RE | Admit: 2020-12-07 | Discharge: 2020-12-07 | Disposition: A | Discharge status: Home / Self Care | Payer: Medicare Other | Source: Ambulatory Visit | Attending: Hematology | Admitting: Hematology

## 2020-12-07 ENCOUNTER — Inpatient Hospital Stay (HOSPITAL_COMMUNITY): Payer: Medicare Other | Attending: Hematology and Oncology | Admitting: Hematology

## 2020-12-07 ENCOUNTER — Other Ambulatory Visit: Payer: Self-pay

## 2020-12-07 ENCOUNTER — Inpatient Hospital Stay (HOSPITAL_COMMUNITY): Payer: Medicare Other

## 2020-12-07 VITALS — BP 146/55 | HR 66 | Temp 98.1°F | Resp 18 | Wt 131.6 lb

## 2020-12-07 DIAGNOSIS — D649 Anemia, unspecified: Secondary | ICD-10-CM

## 2020-12-07 DIAGNOSIS — D696 Thrombocytopenia, unspecified: Secondary | ICD-10-CM | POA: Insufficient documentation

## 2020-12-07 DIAGNOSIS — M8580 Other specified disorders of bone density and structure, unspecified site: Secondary | ICD-10-CM | POA: Diagnosis not present

## 2020-12-07 DIAGNOSIS — D539 Nutritional anemia, unspecified: Secondary | ICD-10-CM | POA: Insufficient documentation

## 2020-12-07 DIAGNOSIS — D472 Monoclonal gammopathy: Secondary | ICD-10-CM | POA: Insufficient documentation

## 2020-12-07 DIAGNOSIS — E46 Unspecified protein-calorie malnutrition: Secondary | ICD-10-CM | POA: Insufficient documentation

## 2020-12-07 DIAGNOSIS — E8809 Other disorders of plasma-protein metabolism, not elsewhere classified: Secondary | ICD-10-CM | POA: Diagnosis not present

## 2020-12-07 LAB — FOLATE: Folate: 17 ng/mL (ref >5.9)

## 2020-12-07 LAB — VITAMIN B12: Vitamin B-12: 323 pg/mL (ref 180–914)

## 2020-12-07 NOTE — Patient Instructions (Addendum)
[place] at [facility] Discharge Instructions  You were seen today by Dr. Delton Coombes. He went over your recent results. You will be referred to [facility] for a bone marrow biopsy prior to your next appointment. You will also be scheduled for an scan of your skeleton prior to your next appointment. Dr. Delton Coombes will see you back in 2 weeks for labs and follow up.   Thank you for choosing [place] at [facility] to provide your oncology and hematology care.  To afford each patient quality time with our provider, please arrive at least 15 minutes before your scheduled appointment time.   If you have a lab appointment with the [facility] please come in thru the Main Entrance and check in at the main information desk  You need to re-schedule your appointment should you arrive 10 or more minutes late.  We strive to give you quality time with our providers, and arriving late affects you and other patients whose appointments are after yours.  Also, if you no show three or more times for appointments you may be dismissed from the clinic at the providers discretion.     Again, thank you for choosing [facility].  Our hope is that these requests will decrease the amount of time that you wait before being seen by our physicians.       _____________________________________________________________  Should you have questions after your visit to [facility], please contact our office at [phone] between the hours of 8:00 a.m. and 4:30 p.m.  Voicemails left after 4:00 p.m. will not be returned until the following business day.  For prescription refill requests, have your pharmacy contact our office and allow 72 hours.    Cancer Center Support Programs:   > Cancer Support Group  2nd Tuesday of the month 1pm-2pm, Journey Room

## 2020-12-09 LAB — IMMUNOFIXATION ELECTROPHORESIS
IgA: 44 mg/dL — ABNORMAL LOW (ref 61–437)
IgG (Immunoglobin G), Serum: 4367 mg/dL — ABNORMAL HIGH (ref 603–1613)
IgM (Immunoglobulin M), Srm: 22 mg/dL (ref 15–143)
Total Protein ELP: 8.6 g/dL — ABNORMAL HIGH (ref 6.0–8.5)

## 2020-12-09 LAB — COPPER, SERUM: Copper: 108 ug/dL (ref 69–132)

## 2020-12-10 LAB — METHYLMALONIC ACID, SERUM: Methylmalonic Acid, Quantitative: 194 nmol/L (ref 0–378)

## 2020-12-14 ENCOUNTER — Other Ambulatory Visit: Payer: Self-pay | Admitting: Internal Medicine

## 2020-12-18 ENCOUNTER — Ambulatory Visit (HOSPITAL_COMMUNITY)
Admission: RE | Admit: 2020-12-18 | Discharge: 2020-12-18 | Disposition: A | Discharge status: Home / Self Care | Payer: Medicare Other | Source: Ambulatory Visit | Attending: Hematology | Admitting: Hematology

## 2020-12-18 ENCOUNTER — Other Ambulatory Visit: Payer: Self-pay

## 2020-12-18 ENCOUNTER — Encounter (HOSPITAL_COMMUNITY): Payer: Self-pay

## 2020-12-18 DIAGNOSIS — Z881 Allergy status to other antibiotic agents status: Secondary | ICD-10-CM | POA: Diagnosis not present

## 2020-12-18 DIAGNOSIS — D539 Nutritional anemia, unspecified: Secondary | ICD-10-CM | POA: Diagnosis not present

## 2020-12-18 DIAGNOSIS — Z880 Allergy status to penicillin: Secondary | ICD-10-CM | POA: Insufficient documentation

## 2020-12-18 DIAGNOSIS — Z888 Allergy status to other drugs, medicaments and biological substances status: Secondary | ICD-10-CM | POA: Insufficient documentation

## 2020-12-18 DIAGNOSIS — D649 Anemia, unspecified: Secondary | ICD-10-CM

## 2020-12-18 DIAGNOSIS — Z955 Presence of coronary angioplasty implant and graft: Secondary | ICD-10-CM | POA: Diagnosis not present

## 2020-12-18 DIAGNOSIS — D72821 Monocytosis (symptomatic): Secondary | ICD-10-CM | POA: Diagnosis not present

## 2020-12-18 DIAGNOSIS — D472 Monoclonal gammopathy: Secondary | ICD-10-CM | POA: Diagnosis not present

## 2020-12-18 DIAGNOSIS — Z79890 Hormone replacement therapy (postmenopausal): Secondary | ICD-10-CM | POA: Diagnosis not present

## 2020-12-18 DIAGNOSIS — Z7902 Long term (current) use of antithrombotics/antiplatelets: Secondary | ICD-10-CM | POA: Insufficient documentation

## 2020-12-18 DIAGNOSIS — Z882 Allergy status to sulfonamides status: Secondary | ICD-10-CM | POA: Insufficient documentation

## 2020-12-18 DIAGNOSIS — Z7982 Long term (current) use of aspirin: Secondary | ICD-10-CM | POA: Diagnosis not present

## 2020-12-18 DIAGNOSIS — Z885 Allergy status to narcotic agent status: Secondary | ICD-10-CM | POA: Diagnosis not present

## 2020-12-18 DIAGNOSIS — D696 Thrombocytopenia, unspecified: Secondary | ICD-10-CM

## 2020-12-18 DIAGNOSIS — C9010 Plasma cell leukemia not having achieved remission: Secondary | ICD-10-CM | POA: Diagnosis not present

## 2020-12-18 LAB — CBC WITH DIFFERENTIAL/PLATELET
Abs Immature Granulocytes: 0.02 10*3/uL (ref 0.00–0.07)
Basophils Absolute: 0 10*3/uL (ref 0.0–0.1)
Basophils Relative: 0 %
Eosinophils Absolute: 0.4 10*3/uL (ref 0.0–0.5)
Eosinophils Relative: 7 %
HCT: 27 % — ABNORMAL LOW (ref 39.0–52.0)
Hemoglobin: 8.9 g/dL — ABNORMAL LOW (ref 13.0–17.0)
Immature Granulocytes: 0 %
Lymphocytes Relative: 30 %
Lymphs Abs: 1.6 10*3/uL (ref 0.7–4.0)
MCH: 38.9 pg — ABNORMAL HIGH (ref 26.0–34.0)
MCHC: 33 g/dL (ref 30.0–36.0)
MCV: 117.9 fL — ABNORMAL HIGH (ref 80.0–100.0)
Monocytes Absolute: 1.7 10*3/uL — ABNORMAL HIGH (ref 0.1–1.0)
Monocytes Relative: 33 %
Neutro Abs: 1.6 10*3/uL — ABNORMAL LOW (ref 1.7–7.7)
Neutrophils Relative %: 30 %
Platelets: 154 10*3/uL (ref 150–400)
RBC: 2.29 MIL/uL — ABNORMAL LOW (ref 4.22–5.81)
RDW: 15 % (ref 11.5–15.5)
WBC: 5.2 10*3/uL (ref 4.0–10.5)
nRBC: 0 % (ref 0.0–0.2)

## 2020-12-18 MED ORDER — LIDOCAINE HCL (PF) 1 % IJ SOLN
INTRAMUSCULAR | Status: AC | PRN
  Administered 2020-12-18: 10 mL

## 2020-12-18 MED ORDER — MIDAZOLAM HCL 2 MG/2ML IJ SOLN
INTRAMUSCULAR | Status: AC | PRN
  Administered 2020-12-18 (×2): 0.5 mg via INTRAVENOUS
  Administered 2020-12-18: 1 mg via INTRAVENOUS

## 2020-12-18 MED ORDER — FENTANYL CITRATE (PF) 100 MCG/2ML IJ SOLN
INTRAMUSCULAR | Status: AC | PRN
  Administered 2020-12-18 (×2): 50 ug via INTRAVENOUS

## 2020-12-18 MED ORDER — FENTANYL CITRATE (PF) 100 MCG/2ML IJ SOLN
INTRAMUSCULAR | Status: AC
  Filled 2020-12-18: qty 2

## 2020-12-18 MED ORDER — SODIUM CHLORIDE 0.9 % IV SOLN: INTRAVENOUS | Status: DC

## 2020-12-18 MED ORDER — MIDAZOLAM HCL 2 MG/2ML IJ SOLN
INTRAMUSCULAR | Status: AC
  Filled 2020-12-18: qty 2

## 2020-12-18 NOTE — H&P (Signed)
Referring Physician(s): Katragadda,Sreedhar  Supervising Physician: Sandi Mariscal  Patient Status:  [facility] OP  Chief Complaint:  "I'm having a bone biopsy"  Subjective: Patient familiar to IR service from left renal mass biopsy and cryoablation in 2018(renal cell cancer).  He now presents with monoclonal gammopathy and macrocytic anemia of uncertain etiology.  He is scheduled today for CT-guided bone marrow biopsy to rule out myeloma.  He currently denies fever, headache, chest pain, dyspnea, cough, abdominal pain, nausea, vomiting or bleeding.  He is slightly hard of hearing and has some intermittent back pain.  Additional history as below.  Past Medical History:  Diagnosis Date   Allergic rhinitis    Anal fissure    Anginal pain (HCC)    Asthma    Cancer (HCC)    skin   Chest pain    CHF (congestive heart failure) ([place])    Coronary heart disease    s/p stenting. cath in 01/2012 noncritical occlusion   Dysrhythmia    1st degree heart block   GERD (gastroesophageal reflux disease)    Glaucoma    Hiatal hernia    Hyperlipidemia    Hypertension    Hypothyroidism    Idiopathic thrombocytopenic purpura ([place]) 2002   Macular degeneration    Nephrolithiasis    PUD (peptic ulcer disease)    remote   Sarcoidosis    pulmonary   Schatzki's ring    Past Surgical History:  Procedure Laterality Date   cardiac stents     COLONOSCOPY  10/30/2006   Normal rectum, sigmoid diverticula.Remainder of colonic mucosa appeared normal.   CORONARY ANGIOPLASTY WITH STENT PLACEMENT     about 10 years ago per pt (around 2007)   [place], URETEROSCOPY AND STENT PLACEMENT Left 06/16/2017   Procedure: CYSTOSCOPY WITH RETROGRADE PYELOGRAM, URETEROSCOPY,STONE EXTRACTION  AND STENT PLACEMENT;  Surgeon: Franchot Gallo, MD;  Location[facility];  Service: Urology;  Laterality: Left;   ESOPHAGOGASTRODUODENOSCOPY  06/19/2004   Two esophageal rings and esophageal web as  described above.  All of these were disrupted by passing 56-French Venia Minks dilator/ Candida esophagitis,which appears to be incidental given history of   antibiotic use, but nevertheless will be treated.   ESOPHAGOGASTRODUODENOSCOPY  10/30/2006   Distal tandem esophageal ring status post dilation disruption as  described above.  Otherwise normal esophagus/  Small hiatal hernia otherwise normal stomach, D1 and D2   ESOPHAGOGASTRODUODENOSCOPY N/A 03/22/2015   Dr.Rourk- noncritical schatzki's ring and hiatal hernia-o/w normal EGD.    ESOPHAGOGASTRODUODENOSCOPY (EGD) WITH ESOPHAGEAL DILATION  03/04/2012   RMR- schatzki's ring, hiatal hernia, polypoid gastric mucosa, bx= minimally active gastritis.   HOLMIUM LASER APPLICATION Left 09/07/1362   Procedure: HOLMIUM LASER APPLICATION;  Surgeon: Franchot Gallo, MD;  [facility];  Service: Urology;  Laterality: Left;   IR GENERIC HISTORICAL  03/06/2016   IR RADIOLOGIST EVAL & MGMT 03/06/2016 Aletta Edouard, MD [facility] INTERV RAD   IR GENERIC HISTORICAL  06/18/2016   IR RADIOLOGIST EVAL & MGMT 06/18/2016 Aletta Edouard, MD [facility] INTERV RAD   IR RADIOLOGIST EVAL & MGMT  10/01/2016   IR RADIOLOGIST EVAL & MGMT  10/15/2017   IR RADIOLOGIST EVAL & MGMT  12/24/2018   IR RADIOLOGIST EVAL & MGMT  01/04/2020   LEFT HEART CATH N/A 02/02/2012   Procedure: LEFT HEART CATH;  Surgeon: Lorretta Harp, MD;  Location: [facility] CATH LAB;  Service: Cardiovascular;  Laterality: N/A;   MEDIASTINOSCOPY     for dx sarcoid  RADIOLOGY WITH ANESTHESIA Left 05/17/2016   Procedure: left renal ablation;  Surgeon: Aletta Edouard, MD;  [facility];  Service: Radiology;  Laterality: Left;       Allergies: Azithromycin, Doxazosin, Acetaminophen, Atenolol, Hydrocodone, Hydrocodone-acetaminophen, Levofloxacin, Morphine, Penicillins, and Sulfonamide derivatives  Medications: Prior to Admission medications   Medication Sig Start Date End Date Taking? Authorizing Provider  aspirin  81 MG tablet Take 81 mg by mouth 3 (three) times a week.    Yes [provider]  atorvastatin (LIPITOR) 40 MG tablet TAKE 1 TABLET BY MOUTH IN  THE EVENING 10/23/20  Yes Troy Sine, MD  brimonidine (ALPHAGAN) 0.2 % ophthalmic solution Place 1 drop into both eyes 2 (two) times daily.   Yes [provider]  clopidogrel (PLAVIX) 75 MG tablet TAKE 1 TABLET BY MOUTH  DAILY 09/04/20  Yes Troy Sine, MD  dorzolamide-timolol (COSOPT) 22.3-6.8 MG/ML ophthalmic solution Place 1 drop into both eyes 2 (two) times daily.   Yes [provider]  furosemide (LASIX) 20 MG tablet Take 1 tablet (20 mg total) by mouth 2 (two) times daily. 11/22/20  Yes Troy Sine, MD  guaiFENesin (MUCINEX) 600 MG 12 hr tablet Take 1,200 mg by mouth daily.    Yes [provider]  isosorbide mononitrate (IMDUR) 60 MG 24 hr tablet TAKE 1.5 TABLETS (90MG) BY MOUTH IN  THE MORNING AND ONE-HALF TABLET (30MG) BY MOUTH AT NIGHT. Patient taking differently: Take 120 mg by mouth daily. TABLETS ONE-Half tablet (60MG) BY MOUTH IN  THE MORNING AND ONE-HALF TABLET (60MG) BY MOUTH AT NIGHT. 09/12/20  Yes Troy Sine, MD  levothyroxine (SYNTHROID) 125 MCG tablet Take 125 mcg by mouth daily before breakfast.   Yes [provider]  losartan (COZAAR) 100 MG tablet TAKE 1 TABLET BY MOUTH  DAILY 01/31/20  Yes Troy Sine, MD  metoprolol tartrate (LOPRESSOR) 25 MG tablet TAKE 1 TABLET BY MOUTH IN  THE MORNING AND 1/2 TABLET  IN THE EVENING 08/14/20  Yes Troy Sine, MD  mometasone (ELOCON) 0.1 % ointment Apply 1 application topically daily.    Yes [provider]  Multiple Vitamin (MULTIVITAMIN) tablet Take 1 tablet by mouth every evening.   Yes [provider]  Multiple Vitamins-Minerals (PRESERVISION/LUTEIN) CAPS Take 1 capsule by mouth 2 (two) times daily.   Yes [provider]  pantoprazole (PROTONIX) 40 MG tablet TAKE 1 TABLET BY MOUTH  DAILY BEFORE BREAKFAST  08/18/20  Yes Mahala Menghini, PA-C  potassium chloride SA (KLOR-CON) 20 MEQ tablet Take 1 tablet (20 mEq total) by mouth daily. 09/30/20  Yes Dunn, Dayna N, PA-C  ROCKLATAN 0.02-0.005 % SOLN Apply to eye. 12/21/19  Yes [provider]  triamcinolone (KENALOG) 0.1 % cream Apply 1 application topically 2 (two) times daily as needed (for irritation).   Yes [provider]  valACYclovir (VALTREX) 1000 MG tablet Take 1,000 mg by mouth 2 (two) times daily. 11/22/20  Yes [provider]  vitamin C (ASCORBIC ACID) 500 MG tablet Take 500 mg by mouth daily.   Yes [provider]  vitamin E 200 UNIT capsule Take 200 Units by mouth daily at 12 noon.    Yes [provider]  nitroGLYCERIN (NITROSTAT) 0.4 MG SL tablet Place 1 tablet (0.4 mg total) under the tongue every 5 (five) minutes as needed. For chest pain Patient not taking: Reported on 12/07/2020 09/27/20   Troy Sine, MD     Vital Signs: BP [place] Kitchen)  165/74 (BP Location: Left Arm)   Pulse 70   Temp 98.4 F (36.9 C) (Oral)   Resp 18   Ht _0  (1.727 m)   Wt 131 lb 9.8 oz (59.7 kg)   SpO2 100%   BMI 20.01 kg/m   Physical Exam awake, alert.  Chest clear to auscultation bilaterally.  Heart with regular rate and rhythm.  Abdomen soft, positive bowel sounds, slightly tender right lower quadrant/inguinal region(known rt inguinal hernia), no lower extremity edema  Imaging: No results found.  Labs:  CBC: Recent Labs    01/12/20 1348 12/01/20 1419 12/18/20 0744  WBC 10.0 4.9 5.2  HGB 10.2* 8.5* 8.9*  HCT 31.7* 26.4* 27.0*  PLT 146* 134* 154    COAGS: No results for input(s): INR, APTT in the last 8760 hours.  BMP: Recent Labs    12/31/19 1012 01/12/20 1348 12/01/20 1419  NA  --  136 134*  K  --  4.5 3.8  CL  --  105 104  CO2  --  24 26  GLUCOSE  --  107* 100*  BUN  --  29* 25*  CALCIUM  --  8.7* 8.9  CREATININE 1.00 0.95 0.90  GFRNONAA  --  >60 >60  GFRAA  --  >60  --     LIVER  FUNCTION TESTS: Recent Labs    12/01/20 1419  BILITOT 0.7  AST 22  ALT 22  ALKPHOS 75  PROT 8.4*  ALBUMIN 3.2*    Assessment and Plan: Patient familiar to IR service from left renal mass biopsy and cryoablation in 2018(renal cell cancer).  He now presents with monoclonal gammopathy and macrocytic anemia of uncertain etiology.  He is scheduled today for CT-guided bone marrow biopsy to rule out myeloma. Risks and benefits of procedure was discussed with the patient  including, but not limited to bleeding, infection, damage to adjacent structures or low yield requiring additional tests.  All of the questions were answered and there is agreement to proceed.  Consent signed and in chart.    Electronically Signed: D. Rowe Robert, PA-C 12/18/2020, 8:31 AM   I spent a total of 20 minutes at the the patient's bedside AND on the patient's hospital floor or unit, greater than 50% of which was counseling/coordinating care for CT-guided bone marrow biopsy

## 2020-12-18 NOTE — Procedures (Signed)
Pre-procedure Diagnosis: Monoclonal gammopathy and macrocytic anemia of uncertain etiology Post-procedure Diagnosis: Same  Technically successful CT guided bone marrow aspiration and biopsy of left iliac crest.   Complications: None Immediate  EBL: None  Signed: Sandi Mariscal Pager: [phone] 12/18/2020, 9:55 AM

## 2020-12-18 NOTE — Discharge Instructions (Signed)

## 2020-12-20 LAB — SURGICAL PATHOLOGY

## 2020-12-21 ENCOUNTER — Ambulatory Visit (HOSPITAL_COMMUNITY): Payer: Medicare Other | Admitting: Nurse Practitioner

## 2020-12-25 ENCOUNTER — Encounter (HOSPITAL_COMMUNITY): Payer: Self-pay | Admitting: Hematology

## 2020-12-25 DIAGNOSIS — Z08 Encounter for follow-up examination after completed treatment for malignant neoplasm: Secondary | ICD-10-CM | POA: Diagnosis not present

## 2020-12-25 DIAGNOSIS — L821 Other seborrheic keratosis: Secondary | ICD-10-CM | POA: Diagnosis not present

## 2020-12-25 DIAGNOSIS — L820 Inflamed seborrheic keratosis: Secondary | ICD-10-CM | POA: Diagnosis not present

## 2020-12-25 DIAGNOSIS — Z1283 Encounter for screening for malignant neoplasm of skin: Secondary | ICD-10-CM | POA: Diagnosis not present

## 2020-12-25 DIAGNOSIS — Z85828 Personal history of other malignant neoplasm of skin: Secondary | ICD-10-CM | POA: Diagnosis not present

## 2020-12-25 LAB — FISH ONCOLOGY

## 2020-12-26 ENCOUNTER — Encounter (HOSPITAL_COMMUNITY): Payer: Self-pay | Admitting: Hematology

## 2020-12-27 DIAGNOSIS — K4090 Unilateral inguinal hernia, without obstruction or gangrene, not specified as recurrent: Secondary | ICD-10-CM | POA: Diagnosis not present

## 2020-12-28 ENCOUNTER — Telehealth: Payer: Self-pay

## 2020-12-28 NOTE — Telephone Encounter (Signed)
   [facility] Pre-operative Risk Assessment    Patient Name: Dale [name]  DOB: Apr 24, 1929 MRN: [id number]  HEARTCARE STAFF:  - IMPORTANT!!!!!! Under Visit Info/Reason for Call, type in Other and utilize the format Clearance MM/DD/YY or Clearance TBD. Do not use dashes or single digits. - Please review there is not already an duplicate clearance open for this procedure. - If request is for dental extraction, please clarify the # of teeth to be extracted. - If the patient is currently at the dentist's office, call Pre-Op Callback Staff (MA/nurse) to input urgent request.  - If the patient is not currently in the dentist office, please route to the Pre-Op pool.  Request for surgical clearance:  What type of surgery is being performed? Hernia Surgery   When is this surgery scheduled? TBD  What type of clearance is required (medical clearance vs. Pharmacy clearance to hold med vs. Both)? Cardiac   Are there any medications that need to be held prior to surgery and how long? Plavix   Practice name and name of physician performing surgery? [facility]    What is the office phone number? [phone]   7.   What is the office fax number? [phone]  8.   Anesthesia type (None, local, MAC, general) ? General    Dale [name] 12/28/2020, 1:15 PM  _________________________________________________________________   (provider comments below)

## 2020-12-29 NOTE — Telephone Encounter (Signed)
S/w tp and his wife who have been updated to appt for pre op clearance will be on 01/16/21 @ 11:15 with [name], PAC. Pt and his wife aware appt with [name] in November still scheduled, they can discuss further with [facility] at pre op appt if 03/2021 appt still needed. Pt and his wife thanked me for the call and the help.

## 2021-01-01 DIAGNOSIS — Z961 Presence of intraocular lens: Secondary | ICD-10-CM | POA: Diagnosis not present

## 2021-01-01 DIAGNOSIS — H401121 Primary open-angle glaucoma, left eye, mild stage: Secondary | ICD-10-CM | POA: Diagnosis not present

## 2021-01-01 DIAGNOSIS — H401113 Primary open-angle glaucoma, right eye, severe stage: Secondary | ICD-10-CM | POA: Diagnosis not present

## 2021-01-03 DIAGNOSIS — I10 Essential (primary) hypertension: Secondary | ICD-10-CM | POA: Diagnosis not present

## 2021-01-03 DIAGNOSIS — R001 Bradycardia, unspecified: Secondary | ICD-10-CM | POA: Diagnosis not present

## 2021-01-03 DIAGNOSIS — R55 Syncope and collapse: Secondary | ICD-10-CM | POA: Diagnosis not present

## 2021-01-03 DIAGNOSIS — R404 Transient alteration of awareness: Secondary | ICD-10-CM | POA: Diagnosis not present

## 2021-01-03 DIAGNOSIS — R4020 Unspecified coma: Secondary | ICD-10-CM | POA: Diagnosis not present

## 2021-01-04 ENCOUNTER — Emergency Department (HOSPITAL_COMMUNITY): Payer: Medicare Other

## 2021-01-04 ENCOUNTER — Emergency Department (HOSPITAL_COMMUNITY)
Admission: EM | Admit: 2021-01-04 | Discharge: 2021-01-04 | Disposition: A | Discharge status: Home / Self Care | Payer: Medicare Other | Attending: Emergency Medicine | Admitting: Emergency Medicine

## 2021-01-04 ENCOUNTER — Inpatient Hospital Stay (HOSPITAL_COMMUNITY): Payer: Medicare Other | Admitting: Hematology

## 2021-01-04 DIAGNOSIS — Z85828 Personal history of other malignant neoplasm of skin: Secondary | ICD-10-CM | POA: Diagnosis not present

## 2021-01-04 DIAGNOSIS — R61 Generalized hyperhidrosis: Secondary | ICD-10-CM | POA: Diagnosis not present

## 2021-01-04 DIAGNOSIS — I10 Essential (primary) hypertension: Secondary | ICD-10-CM | POA: Insufficient documentation

## 2021-01-04 DIAGNOSIS — R001 Bradycardia, unspecified: Secondary | ICD-10-CM | POA: Diagnosis not present

## 2021-01-04 DIAGNOSIS — K219 Gastro-esophageal reflux disease without esophagitis: Secondary | ICD-10-CM | POA: Insufficient documentation

## 2021-01-04 DIAGNOSIS — Z7902 Long term (current) use of antithrombotics/antiplatelets: Secondary | ICD-10-CM | POA: Diagnosis not present

## 2021-01-04 DIAGNOSIS — J45909 Unspecified asthma, uncomplicated: Secondary | ICD-10-CM | POA: Insufficient documentation

## 2021-01-04 DIAGNOSIS — Z7982 Long term (current) use of aspirin: Secondary | ICD-10-CM | POA: Insufficient documentation

## 2021-01-04 DIAGNOSIS — R55 Syncope and collapse: Secondary | ICD-10-CM | POA: Insufficient documentation

## 2021-01-04 DIAGNOSIS — M79661 Pain in right lower leg: Secondary | ICD-10-CM | POA: Insufficient documentation

## 2021-01-04 DIAGNOSIS — R110 Nausea: Secondary | ICD-10-CM | POA: Diagnosis not present

## 2021-01-04 DIAGNOSIS — I25708 Atherosclerosis of coronary artery bypass graft(s), unspecified, with other forms of angina pectoris: Secondary | ICD-10-CM | POA: Insufficient documentation

## 2021-01-04 DIAGNOSIS — Z87891 Personal history of nicotine dependence: Secondary | ICD-10-CM | POA: Diagnosis not present

## 2021-01-04 DIAGNOSIS — I509 Heart failure, unspecified: Secondary | ICD-10-CM | POA: Insufficient documentation

## 2021-01-04 DIAGNOSIS — R39198 Other difficulties with micturition: Secondary | ICD-10-CM | POA: Insufficient documentation

## 2021-01-04 DIAGNOSIS — R1011 Right upper quadrant pain: Secondary | ICD-10-CM | POA: Diagnosis not present

## 2021-01-04 DIAGNOSIS — D649 Anemia, unspecified: Secondary | ICD-10-CM

## 2021-01-04 DIAGNOSIS — Z79899 Other long term (current) drug therapy: Secondary | ICD-10-CM | POA: Insufficient documentation

## 2021-01-04 DIAGNOSIS — E039 Hypothyroidism, unspecified: Secondary | ICD-10-CM | POA: Diagnosis not present

## 2021-01-04 DIAGNOSIS — D696 Thrombocytopenia, unspecified: Secondary | ICD-10-CM

## 2021-01-04 DIAGNOSIS — E8809 Other disorders of plasma-protein metabolism, not elsewhere classified: Secondary | ICD-10-CM

## 2021-01-04 LAB — BASIC METABOLIC PANEL
Anion gap: 7 (ref 5–15)
BUN: 21 mg/dL (ref 8–23)
CO2: 24 mmol/L (ref 22–32)
Calcium: 9.2 mg/dL (ref 8.9–10.3)
Chloride: 103 mmol/L (ref 98–111)
Creatinine, Ser: 0.9 mg/dL (ref 0.61–1.24)
GFR, Estimated: >60 mL/min (ref >60)
Glucose, Bld: 112 mg/dL — ABNORMAL HIGH (ref 70–99)
Potassium: 4 mmol/L (ref 3.5–5.1)
Sodium: 134 mmol/L — ABNORMAL LOW (ref 135–145)

## 2021-01-04 LAB — HEPATIC FUNCTION PANEL
ALT: 22 U/L (ref 0–44)
AST: 24 U/L (ref 15–41)
Albumin: 3 g/dL — ABNORMAL LOW (ref 3.5–5.0)
Alkaline Phosphatase: 69 U/L (ref 38–126)
Bilirubin, Direct: <0.1 mg/dL (ref 0.0–0.2)
Total Bilirubin: 0.9 mg/dL (ref 0.3–1.2)
Total Protein: 8.6 g/dL — ABNORMAL HIGH (ref 6.5–8.1)

## 2021-01-04 LAB — CBG MONITORING, ED: Glucose-Capillary: 101 mg/dL — ABNORMAL HIGH (ref 70–99)

## 2021-01-04 LAB — CBC WITH DIFFERENTIAL/PLATELET
Abs Immature Granulocytes: 0 10*3/uL (ref 0.00–0.07)
Basophils Absolute: 0.1 10*3/uL (ref 0.0–0.1)
Basophils Relative: 1 %
Eosinophils Absolute: 0.1 10*3/uL (ref 0.0–0.5)
Eosinophils Relative: 1 %
HCT: 26 % — ABNORMAL LOW (ref 39.0–52.0)
Hemoglobin: 8.6 g/dL — ABNORMAL LOW (ref 13.0–17.0)
Lymphocytes Relative: 9 %
Lymphs Abs: 1 10*3/uL (ref 0.7–4.0)
MCH: 39.3 pg — ABNORMAL HIGH (ref 26.0–34.0)
MCHC: 33.1 g/dL (ref 30.0–36.0)
MCV: 118.7 fL — ABNORMAL HIGH (ref 80.0–100.0)
Monocytes Absolute: 1.4 10*3/uL — ABNORMAL HIGH (ref 0.1–1.0)
Monocytes Relative: 13 %
Neutro Abs: 8.2 10*3/uL — ABNORMAL HIGH (ref 1.7–7.7)
Neutrophils Relative %: 76 %
Platelets: 149 10*3/uL — ABNORMAL LOW (ref 150–400)
RBC: 2.19 MIL/uL — ABNORMAL LOW (ref 4.22–5.81)
RDW: 15.5 % (ref 11.5–15.5)
WBC: 10.8 10*3/uL — ABNORMAL HIGH (ref 4.0–10.5)
nRBC: 0 % (ref 0.0–0.2)
nRBC: 1 /100 WBC — ABNORMAL HIGH

## 2021-01-04 LAB — LIPASE, BLOOD: Lipase: 51 U/L (ref 11–51)

## 2021-01-04 LAB — D-DIMER, QUANTITATIVE: D-Dimer, Quant: 1.26 ug/mL-FEU — ABNORMAL HIGH (ref 0.00–0.50)

## 2021-01-04 LAB — TROPONIN I (HIGH SENSITIVITY)
Troponin I (High Sensitivity): 6 ng/L (ref <18)
Troponin I (High Sensitivity): 6 ng/L (ref <18)

## 2021-01-04 LAB — BRAIN NATRIURETIC PEPTIDE: B Natriuretic Peptide: 220.6 pg/mL — ABNORMAL HIGH (ref 0.0–100.0)

## 2021-01-04 MED ORDER — IOHEXOL 350 MG/ML SOLN
50.0000 mL | Freq: Once | INTRAVENOUS | Status: AC | PRN
  Administered 2021-01-04: 50 mL via INTRAVENOUS

## 2021-01-04 MED ORDER — SODIUM CHLORIDE 0.9 % IV BOLUS
1000.0000 mL | Freq: Once | INTRAVENOUS | Status: AC
  Administered 2021-01-04: 1000 mL via INTRAVENOUS

## 2021-01-04 NOTE — ED Notes (Signed)
Danial Rueff son [phone] would like an update

## 2021-01-04 NOTE — ED Notes (Signed)
Called Son Dempsy Leseberg [phone] no answer message left.

## 2021-01-04 NOTE — ED Notes (Signed)
Ultrasound at bed side. Will start IV and IVF after.

## 2021-01-04 NOTE — ED Notes (Signed)
Son Moran Sifuentez called back and is enroute to get patient.

## 2021-01-04 NOTE — ED Notes (Signed)
Pt transported to xray 

## 2021-01-04 NOTE — ED Provider Notes (Signed)
Emergency Medicine Provider Triage Evaluation Note  Dale [name] , a 87 y.o. male  was evaluated in triage.  Pt complains of syncope and bradycardia.  Passed out with EMS.  HR in the 40s.  Complains of abdominal pain and nausea.  Review of Systems  Positive: Syncope, bradycardia, abdominal pain Negative: Fever, chills  Physical Exam  There were no vitals taken for this visit. Gen:   Awake, no distress   Resp:  Normal effort  MSK:   Moves extremities without difficulty  Other:    Medical Decision Making  Medically screening exam initiated at 12:31 AM.  Appropriate orders placed.  Dale [name] was informed that the remainder of the evaluation will be completed by another provider, this initial triage assessment does not replace that evaluation, and the importance of remaining in the ED until their evaluation is complete.  Syncope, bradycardia, abdominal pain   Montine Circle, PA-C 01/04/21 0033    Palumbo, April, MD 01/04/21 [phone]

## 2021-01-04 NOTE — ED Notes (Signed)
Orthostatic vital signs:  Lying: BP 154/66 with HR 88 Sitting: BP 145/71 HR 87 Standing: BP 147/67 HR 91

## 2021-01-04 NOTE — ED Notes (Signed)
Pt ambulated in the hallway without assistance. HR 105, SPO2 98%. Pt denies dizziness, nausea.

## 2021-01-04 NOTE — ED Provider Notes (Signed)
[facility] DEPART[facility] Provider Note  CSN: [id number] Arrival date & time: 01/04/21 0028  Chief Complaint(s) Loss of Consciousness and Bradycardia  HPI Dale [name] is a 87 y.o. male with a past medical history listed below who presents to the emergency department for syncopal episode at home.  Patient reports that while taking a shower he began feeling abdominal discomfort, nauseated, and lightheaded.  He stepped out and was able to walk to the kitchen but syncopized.  He is unsure of head trauma.  LOC was reported.  EMS called and when they arrived the patient was alert and oriented.  When they stood the patient up, the patient became bradycardic pale, diaphoretic and had another syncopal episode.  They checked CBG which was 136.  Heart rates in the 80s.  Blood pressure 150s.  He denied any associated chest pain or shortness of breath with a syncopal episode.  Patient denies any recent fevers or infections.  No coughing or congestion.  No vomiting or diarrhea.  No urinary symptoms.  No bloody bowel movements.  Patient does report having right lower leg discomfort that began yesterday.  Located on the medial aspects of the ankle.  Denies any falls or trauma other than today.  Currently denies any other physical complaints.   Loss of Consciousness  Past Medical History Past Medical History:  Diagnosis Date   Allergic rhinitis    Anal fissure    Anginal pain (HCC)    Asthma    Cancer (HCC)    skin   Chest pain    CHF (congestive heart failure) ([place])    Coronary heart disease    s/p stenting. cath in 01/2012 noncritical occlusion   Dysrhythmia    1st degree heart block   GERD (gastroesophageal reflux disease)    Glaucoma    Hiatal hernia    Hyperlipidemia    Hypertension    Hypothyroidism    Idiopathic thrombocytopenic purpura ([place]) 2002   Macular degeneration    Nephrolithiasis    PUD (peptic ulcer disease)    remote   Sarcoidosis     pulmonary   Schatzki's ring    Patient Active Problem List   Diagnosis Date Noted   Plasma cell dyscrasia 12/07/2020   Anemia 05/21/2017   Weight loss of more than 10% body weight 01/27/2017   Sinusitis 01/02/2017   Left renal mass 05/17/2016   Hiatal hernia    Schatzki's ring    Odynophagia 03/03/2015   Easy bruisability 09/26/2014   Hyperlipidemia LDL goal <70 06/28/2014   First degree AV block 10/08/2013   Chest pain 02/02/2012   CAD, RCA stent, RCA new DES July 2009, Cath [place] 2011, 02/02/12 02/02/2012   Unstable angina, cath showed patent stents 02/02/12 02/02/2012   ITP (idiopathic thrombocytopenic purpura) 2002 02/02/2012   BPH (benign prostatic hyperplasia) 02/02/2012   Chest pain 08/15/2011   Macular degeneration 08/15/2011   History of ITP 08/15/2011   Abdominal pain 06/28/2010   DIARRHEA 06/21/2010   GERD 01/19/2009   HEMATOCHEZIA 01/19/2009   DYSPHAGIA UNSPECIFIED 01/19/2009   Hypothyroidism 01/18/2009   HYPERCHOLESTEROLEMIA 01/18/2009   GLAUCOMA 01/18/2009   Essential hypertension 01/18/2009   CHF 01/18/2009   SCHATZKI'S RING 01/18/2009   ANAL FISSURE 01/18/2009   NEPHROLITHIASIS 01/18/2009   PUD, HX OF 01/18/2009   Sarcoidosis 08/27/2007   Coronary atherosclerosis 08/27/2007   Seasonal and perennial allergic rhinitis 08/27/2007   Allergic asthma, mild intermittent, uncomplicated [id number]   Home Medication(s) Prior to Admission  medications   Medication Sig Start Date End Date Taking? Authorizing Provider  nitroGLYCERIN (NITROSTAT) 0.4 MG SL tablet Place 1 tablet (0.4 mg total) under the tongue every 5 (five) minutes as needed. For chest pain 09/27/20  Yes Troy Sine, MD  aspirin 81 MG tablet Take 81 mg by mouth every Monday, Wednesday, and Friday.    [provider]  atorvastatin (LIPITOR) 40 MG tablet TAKE 1 TABLET BY MOUTH IN  THE EVENING Patient taking differently: Take 40 mg by mouth every evening. 10/23/20   Troy Sine, MD   brimonidine (ALPHAGAN) 0.2 % ophthalmic solution Place 1 drop into both eyes 2 (two) times daily.    [provider]  clopidogrel (PLAVIX) 75 MG tablet TAKE 1 TABLET BY MOUTH  DAILY Patient taking differently: Take 75 mg by mouth daily. 09/04/20   Troy Sine, MD  dorzolamide-timolol (COSOPT) 22.3-6.8 MG/ML ophthalmic solution Place 1 drop into both eyes 2 (two) times daily.    [provider]  furosemide (LASIX) 20 MG tablet Take 1 tablet (20 mg total) by mouth 2 (two) times daily. 11/22/20   Troy Sine, MD  guaiFENesin (MUCINEX) 600 MG 12 hr tablet Take 1,200 mg by mouth daily.     [provider]  isosorbide mononitrate (IMDUR) 120 MG 24 hr tablet Take 60 mg by mouth 2 (two) times daily. 10/17/20   [provider]  isosorbide mononitrate (IMDUR) 60 MG 24 hr tablet TAKE 1.5 TABLETS ('90MG'$ ) BY MOUTH IN  THE MORNING AND ONE-HALF TABLET ('30MG'$ ) BY MOUTH AT NIGHT. Patient not taking: Reported on 01/04/2021 09/12/20   Troy Sine, MD  levothyroxine (SYNTHROID) 125 MCG tablet Take 125 mcg by mouth daily before breakfast.    [provider]  losartan (COZAAR) 100 MG tablet TAKE 1 TABLET BY MOUTH  DAILY Patient taking differently: Take 100 mg by mouth daily. 01/31/20   Troy Sine, MD  metoprolol tartrate (LOPRESSOR) 25 MG tablet TAKE 1 TABLET BY MOUTH IN  THE MORNING AND 1/2 TABLET  IN THE EVENING Patient taking differently: Take 12.5-25 mg by mouth See admin instructions. 25 mg in the morning  12.5 mg in the evening 08/14/20   Troy Sine, MD  mometasone (ELOCON) 0.1 % ointment Apply 1 application topically daily.     [provider]  Multiple Vitamin (MULTIVITAMIN) tablet Take 1 tablet by mouth every evening.    [provider]  Multiple Vitamins-Minerals (PRESERVISION/LUTEIN) CAPS Take 1 capsule by mouth 2 (two) times daily.    [provider]  pantoprazole (PROTONIX) 40 MG tablet TAKE 1 TABLET BY MOUTH  DAILY BEFORE  BREAKFAST Patient taking differently: Take 40 mg by mouth daily. 08/18/20   Mahala Menghini, PA-C  potassium chloride SA (KLOR-CON) 20 MEQ tablet Take 1 tablet (20 mEq total) by mouth daily. 09/30/20   Dunn, Dayna N, PA-C  ROCKLATAN 0.02-0.005 % SOLN Apply to eye. 12/21/19   [provider]  triamcinolone (KENALOG) 0.1 % cream Apply 1 application topically 2 (two) times daily as needed (for irritation).    [provider]  valACYclovir (VALTREX) 1000 MG tablet Take 1,000 mg by mouth 2 (two) times daily. 11/22/20   [provider]  vitamin C (ASCORBIC ACID) 500 MG tablet Take 500 mg by mouth daily.    [provider]  vitamin E 200 UNIT capsule Take 200 Units by mouth daily at 12 noon.     [provider]  Past Surgical History Past Surgical History:  Procedure Laterality Date   cardiac stents     COLONOSCOPY  10/30/2006   Normal rectum, sigmoid diverticula.Remainder of colonic mucosa appeared normal.   CORONARY ANGIOPLASTY WITH STENT PLACEMENT     about 10 years ago per pt (around 2007)   [place], URETEROSCOPY AND STENT PLACEMENT Left 06/16/2017   Procedure: CYSTOSCOPY WITH RETROGRADE PYELOGRAM, URETEROSCOPY,STONE EXTRACTION  AND STENT PLACEMENT;  Surgeon: Franchot Gallo, MD;  Location: [facility];  Service: Urology;  Laterality: Left;   ESOPHAGOGASTRODUODENOSCOPY  06/19/2004   Two esophageal rings and esophageal web as described above.  All of these were disrupted by passing 56-French Venia Minks dilator/ Candida esophagitis,which appears to be incidental given history of   antibiotic use, but nevertheless will be treated.   ESOPHAGOGASTRODUODENOSCOPY  10/30/2006   Distal tandem esophageal ring status post dilation disruption as  described above.  Otherwise normal esophagus/  Small hiatal hernia  otherwise normal stomach, D1 and D2   ESOPHAGOGASTRODUODENOSCOPY N/A 03/22/2015   Dr.Rourk- noncritical schatzki's ring and hiatal hernia-o/w normal EGD.    ESOPHAGOGASTRODUODENOSCOPY (EGD) WITH ESOPHAGEAL DILATION  03/04/2012   RMR- schatzki's ring, hiatal hernia, polypoid gastric mucosa, bx= minimally active gastritis.   HOLMIUM LASER APPLICATION Left [id number]   Procedure: HOLMIUM LASER APPLICATION;  Surgeon: Franchot Gallo, MD;  Location: [facility];  Service: Urology;  Laterality: Left;   IR GENERIC HISTORICAL  03/06/2016   IR RADIOLOGIST EVAL & MGMT 03/06/2016 Aletta Edouard, MD [facility] INTERV RAD   IR GENERIC HISTORICAL  06/18/2016   IR RADIOLOGIST EVAL & MGMT 06/18/2016 Aletta Edouard, MD [facility] INTERV RAD   IR RADIOLOGIST EVAL & MGMT  10/01/2016   IR RADIOLOGIST EVAL & MGMT  10/15/2017   IR RADIOLOGIST EVAL & MGMT  12/24/2018   IR RADIOLOGIST EVAL & MGMT  01/04/2020   LEFT HEART CATH N/A 02/02/2012   Procedure: LEFT HEART CATH;  Surgeon: Lorretta Harp, MD;  Location: [facility] CATH LAB;  Service: Cardiovascular;  Laterality: N/A;   MEDIASTINOSCOPY     for dx sarcoid   RADIOLOGY WITH ANESTHESIA Left 05/17/2016   Procedure: left renal ablation;  Surgeon: Aletta Edouard, MD;  Location: [facility];  Service: Radiology;  Laterality: Left;   Family History Family History  Problem Relation Age of Onset   Heart disease Father        deceased age 70   Stroke Mother    Alzheimer's disease Mother    Heart attack Brother        deceased age 16   Cancer Other        niece   Colon cancer Neg Hx     Social History Social History   Tobacco Use   Smoking status: Former    Packs/day: 0.10    Years: 2.00    Pack years: 0.20    Types: Cigarettes, Cigars    Quit date: 05/06/1970    Years since quitting: 50.7   Smokeless tobacco: Never  Vaping Use   Vaping Use: Never used  Substance Use Topics   Alcohol use: No    Alcohol/week: 0.0 standard drinks   Drug use: No   Allergies Azithromycin,  Doxazosin, Acetaminophen, Atenolol, Hydrocodone, Hydrocodone-acetaminophen, Levofloxacin, Morphine, Penicillins, and Sulfonamide derivatives  Review of Systems Review of Systems  Cardiovascular:  Positive for syncope.  All other systems are reviewed and are negative for acute change except as noted in the HPI  Physical Exam Vital Signs  I have reviewed the triage  vital signs BP (!) 159/68   Pulse 78   Temp 99.1 F (37.3 C) (Oral)   Resp 15   SpO2 100%   Physical Exam Vitals reviewed.  Constitutional:      General: He is not in acute distress.    Appearance: He is well-developed. He is not diaphoretic.  HENT:     Head: Normocephalic and atraumatic.     Nose: Nose normal.  Eyes:     General: No scleral icterus.       Right eye: No discharge.        Left eye: No discharge.     Conjunctiva/sclera: Conjunctivae normal.     Pupils: Pupils are equal, round, and reactive to light.  Cardiovascular:     Rate and Rhythm: Normal rate and regular rhythm.     Heart sounds: No murmur heard.   No friction rub. No gallop.  Pulmonary:     Effort: Pulmonary effort is normal. No respiratory distress.     Breath sounds: Normal breath sounds. No stridor. No rales.  Abdominal:     General: There is no distension.     Palpations: Abdomen is soft.     Tenderness: There is no abdominal tenderness.  Musculoskeletal:     Cervical back: Normal range of motion and neck supple.     Right ankle: Tenderness present.  Skin:    General: Skin is warm and dry.     Findings: No erythema or rash.  Neurological:     Mental Status: He is alert and oriented to person, place, and time.    ED Results and Treatments Labs (all labs ordered are listed, but only abnormal results are displayed) Labs Reviewed  CBC WITH DIFFERENTIAL/PLATELET - Abnormal; Notable for the following components:      Result Value   WBC 10.8 (*)    RBC 2.19 (*)    Hemoglobin 8.6 (*)    HCT 26.0 (*)    MCV 118.7 (*)    MCH 39.3  (*)    Platelets 149 (*)    Neutro Abs 8.2 (*)    Monocytes Absolute 1.4 (*)    nRBC 1 (*)    All other components within normal limits  BASIC METABOLIC PANEL - Abnormal; Notable for the following components:   Sodium 134 (*)    Glucose, Bld 112 (*)    All other components within normal limits  HEPATIC FUNCTION PANEL - Abnormal; Notable for the following components:   Total Protein 8.6 (*)    Albumin 3.0 (*)    All other components within normal limits  D-DIMER, QUANTITATIVE - Abnormal; Notable for the following components:   D-Dimer, Quant 1.26 (*)    All other components within normal limits  BRAIN NATRIURETIC PEPTIDE - Abnormal; Notable for the following components:   B Natriuretic Peptide 220.6 (*)    All other components within normal limits  CBG MONITORING, ED - Abnormal; Notable for the following components:   Glucose-Capillary 101 (*)    All other components within normal limits  LIPASE, BLOOD  TROPONIN I (HIGH SENSITIVITY)  TROPONIN I (HIGH SENSITIVITY)  EKG  EKG Interpretation  Date/Time:  Thursday January 04 2021 00:28:03 EDT Ventricular Rate:  85 PR Interval:  212 QRS Duration: 82 QT Interval:  356 QTC Calculation: 423 R Axis:   66 Text Interpretation: Sinus rhythm with 1st degree A-V block Otherwise normal ECG Confirmed by Addison Lank [phone]) on 01/04/2021 1:03:13 AM       Radiology DG Chest 2 View  Result Date: 01/04/2021 CLINICAL DATA:  Syncope. EXAM: CHEST - 2 VIEW COMPARISON:  Chest radiograph dated 10/16/2020. chest CT dated 04/07/2017. FINDINGS: A 2 cm apparent rounded density over the right upper lobe, likely artifactual and related to costochondral junction. Underlying mass is not excluded but much less likely. This was not seen on the prior radiograph of 11/15/2020. Attention on follow-up imaging recommended. Bilateral streaky  densities, chronic. No focal consolidation, pleural effusion or pneumothorax. Probable small calcified granuloma in the lower lung. Stable cardiac silhouette. Slight prominence of the hilar vasculature may represent pulmonary hypertension. No acute osseous pathology. Degenerative changes of the spine. IMPRESSION: 1. No acute cardiopulmonary process. 2. Probable artifactual 2 cm rounded density over the right upper lobe. Attention on follow-up imaging recommended. Electronically Signed   By: Anner Crete M.D.   On: 01/04/2021 01:52   DG Ankle Complete Right  Result Date: 01/04/2021 CLINICAL DATA:  Fall, ankle pain EXAM: RIGHT ANKLE - COMPLETE 3+ VIEW COMPARISON:  None. FINDINGS: No fracture or dislocation is seen. The ankle mortise is intact. The base of the fifth metatarsal is unremarkable. Visualized soft tissues are within normal limits. Vascular calcifications. IMPRESSION: Negative. Electronically Signed   By: Julian Hy M.D.   On: 01/04/2021 02:38   CT HEAD WO CONTRAST (5MM)  Result Date: 01/04/2021 CLINICAL DATA:  Fall EXAM: CT HEAD WITHOUT CONTRAST CT CERVICAL SPINE WITHOUT CONTRAST TECHNIQUE: Multidetector CT imaging of the head and cervical spine was performed following the standard protocol without intravenous contrast. Multiplanar CT image reconstructions of the cervical spine were also generated. COMPARISON:  None. FINDINGS: CT HEAD FINDINGS Brain: There is no mass, hemorrhage or extra-axial collection. There is generalized atrophy without lobar predilection. There is hypoattenuation of the periventricular white matter, most commonly indicating chronic ischemic microangiopathy. Vascular: Atherosclerotic calcification of the vertebral and internal carotid arteries at the skull base. No abnormal hyperdensity of the major intracranial arteries or dural venous sinuses. Skull: The visualized skull base, calvarium and extracranial soft tissues are normal. Sinuses/Orbits: Soft tissue filling the  left maxillary sinus, likely antrochoanal polyp. The orbits are normal. CT CERVICAL SPINE FINDINGS Alignment: No static subluxation. Facets are aligned. Occipital condyles are normally positioned. Skull base and vertebrae: No acute fracture. Soft tissues and spinal canal: No prevertebral fluid or swelling. No visible canal hematoma. Disc levels: No advanced spinal canal or neural foraminal stenosis. Upper chest: No pneumothorax, pulmonary nodule or pleural effusion. Other: Normal visualized paraspinal cervical soft tissues. IMPRESSION: 1. Chronic ischemic microangiopathy and generalized atrophy without acute intracranial abnormality. 2. No acute fracture or static subluxation of the cervical spine. 3. Soft tissue filling the left maxillary sinus, likely antrochoanal polyp. Electronically Signed   By: Ulyses Jarred M.D.   On: 01/04/2021 03:01   CT Angio Chest PE W and/or Wo Contrast  Result Date: 01/04/2021 CLINICAL DATA:  Elevated D-dimer EXAM: CT ANGIOGRAPHY CHEST WITH CONTRAST TECHNIQUE: Multidetector CT imaging of the chest was performed using the standard protocol during bolus administration of intravenous contrast. Multiplanar CT image reconstructions and MIPs were obtained to evaluate the vascular anatomy. CONTRAST:  37m OMNIPAQUE IOHEXOL 350 MG/ML SOLN COMPARISON:  04/07/2017 FINDINGS: Cardiovascular: Satisfactory opacification of the pulmonary arteries to the segmental level. No evidence of pulmonary embolism. Normal heart size. No pericardial effusion. Interlobar right and left lower lobar pulmonary arteries are wasted with scar-like appearance, possibly related to prior granulomatous disease given nodal changes and adjacent parenchymal bands. Aortic and coronary atherosclerosis. Mediastinum/Nodes: Calcified mediastinal lymph nodes attributed to chronic granulomatous disease. Lungs/Pleura: Bands of opacity following the major fissures. Mild dependent ground-glass opacity attributed to atelectasis. There  is no edema, consolidation, effusion, or pneumothorax. Upper Abdomen: 2.8 cm left hepatic cyst. Musculoskeletal: No acute or aggressive finding. Bridging thoracic osteophytes Review of the MIP images confirms the above findings. IMPRESSION: 1. Negative for pulmonary embolism or other acute finding. 2. Remote granulomatous disease with stable scarring. 3. Atherosclerosis, including the coronary arteries. Aortic Atherosclerosis (ICD10-I70.0). Electronically Signed   By: JMonte FantasiaM.D.   On: 01/04/2021 04:15   CT Cervical Spine Wo Contrast  Result Date: 01/04/2021 CLINICAL DATA:  Fall EXAM: CT HEAD WITHOUT CONTRAST CT CERVICAL SPINE WITHOUT CONTRAST TECHNIQUE: Multidetector CT imaging of the head and cervical spine was performed following the standard protocol without intravenous contrast. Multiplanar CT image reconstructions of the cervical spine were also generated. COMPARISON:  None. FINDINGS: CT HEAD FINDINGS Brain: There is no mass, hemorrhage or extra-axial collection. There is generalized atrophy without lobar predilection. There is hypoattenuation of the periventricular white matter, most commonly indicating chronic ischemic microangiopathy. Vascular: Atherosclerotic calcification of the vertebral and internal carotid arteries at the skull base. No abnormal hyperdensity of the major intracranial arteries or dural venous sinuses. Skull: The visualized skull base, calvarium and extracranial soft tissues are normal. Sinuses/Orbits: Soft tissue filling the left maxillary sinus, likely antrochoanal polyp. The orbits are normal. CT CERVICAL SPINE FINDINGS Alignment: No static subluxation. Facets are aligned. Occipital condyles are normally positioned. Skull base and vertebrae: No acute fracture. Soft tissues and spinal canal: No prevertebral fluid or swelling. No visible canal hematoma. Disc levels: No advanced spinal canal or neural foraminal stenosis. Upper chest: No pneumothorax, pulmonary nodule or  pleural effusion. Other: Normal visualized paraspinal cervical soft tissues. IMPRESSION: 1. Chronic ischemic microangiopathy and generalized atrophy without acute intracranial abnormality. 2. No acute fracture or static subluxation of the cervical spine. 3. Soft tissue filling the left maxillary sinus, likely antrochoanal polyp. Electronically Signed   By: KUlyses JarredM.D.   On: 01/04/2021 03:01   UK[place]Abdomen Limited RUQ (LIVER/GB)  Result Date: 01/04/2021 CLINICAL DATA:  Right upper quadrant abdominal pain EXAM: ULTRASOUND ABDOMEN LIMITED RIGHT UPPER QUADRANT COMPARISON:  CT abdomen/pelvis dated 10/09/2020. FINDINGS: Gallbladder: No gallstones, gallbladder wall thickening, or pericholecystic fluid. Negative sonographic Murphy's sign. Common bile duct: Diameter: 6 mm Liver: Hyperechoic hepatic parenchyma, suggesting hepatic steatosis. 3.0 x 2.8 x 3.2 cm left hepatic cyst, benign. Portal vein is patent on color Doppler imaging with normal direction of blood flow towards the liver. Other: None. IMPRESSION: 3.2 cm left hepatic cysts, benign. Suspected hepatic steatosis. Electronically Signed   By: SJulian HyM.D.   On: 01/04/2021 02:40    Pertinent labs & imaging results that were available during my care of the patient were reviewed by me and considered in my medical decision making (see MDM for details).  Medications Ordered in ED Medications  sodium chloride 0.9 % bolus 1,000 mL (0 mLs Intravenous Stopped 01/04/21 0321)  iohexol (OMNIPAQUE) 350 MG/ML injection 50 mL (50 mLs Intravenous Contrast Given 01/04/21 0401)  Procedures Procedures  (including critical care time)  Medical Decision Making / ED Course I have reviewed the nursing notes for this encounter and the patient's prior records (if available in EHR or on provided paperwork).  Dale [name] was  evaluated in Emergency Department on 01/04/2021 for the symptoms described in the history of present illness. He was evaluated in the context of the global COVID-19 pandemic, which necessitated consideration that the patient might be at risk for infection with the SARS-CoV-2 virus that causes COVID-19. Institutional protocols and algorithms that pertain to the evaluation of patients at risk for COVID-19 are in a state of rapid change based on information released by regulatory bodies including the CDC and federal and state organizations. These policies and algorithms were followed during the patient's care in the ED.     Patient appears to have had micturition syncope. It resulted in the fall. Given patient's age and comorbidities, extensive work-up was obtained.  Pertinent labs & imaging results that were available during my care of the patient were reviewed by me and considered in my medical decision making:  EKG with first-degree AV block similar to prior.  No other acute ischemic changes, dysrhythmias. Serial troponins were negative x2. BnP reassuring Dimer was positive -CTA obtained ruling out PE.  No pneumonia. Hemoglobin stable. No significant electrolyte derangements or renal sufficiency.  CT head and cervical spine negative for acute injury.  Patient had right ankle pain and plain film is negative. Also had right upper quadrant tenderness.  Right upper quadrant ultrasound negative. No evidence of bili obstruction or pancreatitis on labs.  Patient provided with IV fluids. Several orthostatic vitals reassuring. Patient able to ambulate without complication. After several hours of monitoring patient was felt to be safe for discharge home  Final Clinical Impression(s) / ED Diagnoses Final diagnoses:  RUQ pain  Micturition syncope   The patient appears reasonably screened and/or stabilized for discharge and I doubt any other medical condition or other [facility] requiring further  screening, evaluation, or treatment in the ED at this time prior to discharge. Safe for discharge with strict return precautions.  Disposition: Discharge  Condition: Good  I have discussed the results, Dx and Tx plan with the patient/family who expressed understanding and agree(s) with the plan. Discharge instructions discussed at length. The patient/family was given strict return precautions who verbalized understanding of the instructions. No further questions at time of discharge.    ED Discharge Orders     None        Follow Up: [employer], [place] [id number] [phone]  Call  to schedule an appointment for close follow up     This chart was dictated using voice recognition software.  Despite best efforts to proofread,  errors can occur which can change the documentation meaning.    Fatima Blank, MD 01/04/21 [phone]

## 2021-01-04 NOTE — ED Triage Notes (Signed)
Brought in by [facility] - initially called in for unresponsiveness. Upon EMS arrival pt was alert and oriented - Sinus rhythm on monitor. Pt got up - brady'd, fainted, pale and diaphoretic. Ems threw in pads.   G18 left ac. Bgl 136 HR 80s bp 156/80

## 2021-01-05 ENCOUNTER — Other Ambulatory Visit: Payer: Self-pay | Admitting: Internal Medicine

## 2021-01-05 ENCOUNTER — Other Ambulatory Visit (HOSPITAL_COMMUNITY): Payer: Self-pay | Admitting: Internal Medicine

## 2021-01-05 ENCOUNTER — Other Ambulatory Visit: Payer: Self-pay

## 2021-01-05 ENCOUNTER — Ambulatory Visit (HOSPITAL_COMMUNITY)
Admission: RE | Admit: 2021-01-05 | Discharge: 2021-01-05 | Disposition: A | Discharge status: Home / Self Care | Payer: Medicare Other | Source: Ambulatory Visit | Attending: Internal Medicine | Admitting: Internal Medicine

## 2021-01-05 DIAGNOSIS — Z681 Body mass index (BMI) 19 or less, adult: Secondary | ICD-10-CM | POA: Diagnosis not present

## 2021-01-05 DIAGNOSIS — M79604 Pain in right leg: Secondary | ICD-10-CM

## 2021-01-05 DIAGNOSIS — E063 Autoimmune thyroiditis: Secondary | ICD-10-CM | POA: Diagnosis not present

## 2021-01-05 DIAGNOSIS — I872 Venous insufficiency (chronic) (peripheral): Secondary | ICD-10-CM | POA: Diagnosis not present

## 2021-01-05 DIAGNOSIS — I87391 Chronic venous hypertension (idiopathic) with other complications of right lower extremity: Secondary | ICD-10-CM | POA: Diagnosis not present

## 2021-01-05 DIAGNOSIS — R600 Localized edema: Secondary | ICD-10-CM | POA: Diagnosis not present

## 2021-01-05 DIAGNOSIS — N400 Enlarged prostate without lower urinary tract symptoms: Secondary | ICD-10-CM | POA: Diagnosis not present

## 2021-01-05 DIAGNOSIS — M1991 Primary osteoarthritis, unspecified site: Secondary | ICD-10-CM | POA: Diagnosis not present

## 2021-01-05 DIAGNOSIS — I10 Essential (primary) hypertension: Secondary | ICD-10-CM | POA: Diagnosis not present

## 2021-01-07 ENCOUNTER — Other Ambulatory Visit: Payer: Self-pay

## 2021-01-07 ENCOUNTER — Ambulatory Visit
Admission: EM | Admit: 2021-01-07 | Discharge: 2021-01-07 | Disposition: A | Discharge status: Home / Self Care | Payer: Medicare Other | Attending: Internal Medicine | Admitting: Internal Medicine

## 2021-01-07 DIAGNOSIS — L03115 Cellulitis of right lower limb: Secondary | ICD-10-CM | POA: Diagnosis not present

## 2021-01-07 MED ORDER — DOXYCYCLINE HYCLATE 100 MG PO CAPS: 100.0000 mg | ORAL_CAPSULE | Freq: Two times a day (BID) | ORAL | 0 refills | Status: AC

## 2021-01-07 NOTE — ED Triage Notes (Signed)
Pt presents with right leg swelling and redness for past few days, pt was seen by pcp Friday and was sent for labs, does not have results back. Son states medication was supposed to be sent for redness bu was not

## 2021-01-07 NOTE — Discharge Instructions (Addendum)
Please take your medication as prescribed Please take your lasix as directed Elevate your leg to help with swelling Please wear your compression stocking once the swelling improves. Return to the urgent care if symptoms persist.

## 2021-01-07 NOTE — ED Provider Notes (Signed)
[facility]    CSN: [id number] Arrival date & time: 01/07/21  1115      History   Chief Complaint Chief Complaint  Patient presents with   Leg Swelling    HPI Dale [name] is a 87 y.o. male comes to the urgent care with 3-day history of swelling and redness of the right leg.  Symptom onset was insidious and has been persistent.  Erythema has worsened.  Patient denies any fever or chills.  Swelling in the leg is recurrent and unilateral at this time.  Patient is supposed to take Lasix 40 mg in the morning and 20 mg in the evening but he is currently taking only 20 mg at dinnertime.  He does not elevate his leg.  Patient was seen in the emergency room a few days ago for syncope and during that visit he was promised some ointment for erythema and swelling.  Patient denies any calf pain or tenderness.  He is not sedentary and has not traveled long distances recently.   HPI  Past Medical History:  Diagnosis Date   Allergic rhinitis    Anal fissure    Anginal pain (HCC)    Asthma    Cancer ([place])    skin   Chest pain    CHF (congestive heart failure) ([place])    Coronary heart disease    s/p stenting. cath in 01/2012 noncritical occlusion   Dysrhythmia    1st degree heart block   GERD (gastroesophageal reflux disease)    Glaucoma    Hiatal hernia    Hyperlipidemia    Hypertension    Hypothyroidism    Idiopathic thrombocytopenic purpura ([place]) 2002   Macular degeneration    Nephrolithiasis    PUD (peptic ulcer disease)    remote   Sarcoidosis    pulmonary   Schatzki's ring     Patient Active Problem List   Diagnosis Date Noted   Plasma cell dyscrasia 12/07/2020   Anemia 05/21/2017   Weight loss of more than 10% body weight 01/27/2017   Sinusitis 01/02/2017   Left renal mass 05/17/2016   Hiatal hernia    Schatzki's ring    Odynophagia 03/03/2015   Easy bruisability 09/26/2014   Hyperlipidemia LDL goal <70 06/28/2014   First degree AV block 10/08/2013    Chest pain 02/02/2012   CAD, RCA stent, RCA new DES July 2009, Cath [place] 2011, 02/02/12 02/02/2012   Unstable angina, cath showed patent stents 02/02/12 02/02/2012   ITP (idiopathic thrombocytopenic purpura) 2002 02/02/2012   BPH (benign prostatic hyperplasia) 02/02/2012   Chest pain 08/15/2011   Macular degeneration 08/15/2011   History of ITP 08/15/2011   Abdominal pain 06/28/2010   DIARRHEA 06/21/2010   GERD 01/19/2009   HEMATOCHEZIA 01/19/2009   DYSPHAGIA UNSPECIFIED 01/19/2009   Hypothyroidism 01/18/2009   HYPERCHOLESTEROLEMIA 01/18/2009   GLAUCOMA 01/18/2009   Essential hypertension 01/18/2009   CHF 01/18/2009   SCHATZKI'S RING 01/18/2009   ANAL FISSURE 01/18/2009   NEPHROLITHIASIS 01/18/2009   PUD, HX OF 01/18/2009   Sarcoidosis 08/27/2007   Coronary atherosclerosis 08/27/2007   Seasonal and perennial allergic rhinitis 08/27/2007   Allergic asthma, mild intermittent, uncomplicated [id number]    Past Surgical History:  Procedure Laterality Date   cardiac stents     COLONOSCOPY  10/30/2006   Normal rectum, sigmoid diverticula.Remainder of colonic mucosa appeared normal.   CORONARY ANGIOPLASTY WITH STENT PLACEMENT     about 10 years ago per pt (around 2007)   CYSTOSCOPY WITH  RETROGRADE PYELOGRAM, URETEROSCOPY AND STENT PLACEMENT Left 06/16/2017   Procedure: CYSTOSCOPY WITH RETROGRADE PYELOGRAM, URETEROSCOPY,STONE EXTRACTION  AND STENT PLACEMENT;  Surgeon: Franchot Gallo, MD;  Location: [facility];  Service: Urology;  Laterality: Left;   ESOPHAGOGASTRODUODENOSCOPY  06/19/2004   Two esophageal rings and esophageal web as described above.  All of these were disrupted by passing 56-French Venia Minks dilator/ Candida esophagitis,which appears to be incidental given history of   antibiotic use, but nevertheless will be treated.   ESOPHAGOGASTRODUODENOSCOPY  10/30/2006   Distal tandem esophageal ring status post dilation disruption as  described above.  Otherwise normal esophagus/  Small  hiatal hernia otherwise normal stomach, D1 and D2   ESOPHAGOGASTRODUODENOSCOPY N/A 03/22/2015   Dr.Rourk- noncritical schatzki's ring and hiatal hernia-o/w normal EGD.    ESOPHAGOGASTRODUODENOSCOPY (EGD) WITH ESOPHAGEAL DILATION  03/04/2012   [facility]- schatzki's ring, hiatal hernia, polypoid gastric mucosa, bx= minimally active gastritis.   HOLMIUM LASER APPLICATION Left [id number]   Procedure: HOLMIUM LASER APPLICATION;  Surgeon: Franchot Gallo, MD;  Location: [facility];  Service: Urology;  Laterality: Left;   IR GENERIC HISTORICAL  03/06/2016   IR RADIOLOGIST EVAL & MGMT 03/06/2016 Aletta Edouard, MD [facility]   IR GENERIC HISTORICAL  06/18/2016   IR RADIOLOGIST EVAL & MGMT 06/18/2016 Aletta Edouard, MD [facility]   IR RADIOLOGIST EVAL & MGMT  10/01/2016   IR RADIOLOGIST EVAL & MGMT  10/15/2017   IR RADIOLOGIST EVAL & MGMT  12/24/2018   IR RADIOLOGIST EVAL & MGMT  01/04/2020   LEFT HEART CATH N/A 02/02/2012   Procedure: LEFT HEART CATH;  Surgeon: Lorretta Harp, MD;  Location: [facility];  Service: Cardiovascular;  Laterality: N/A;   MEDIASTINOSCOPY     for dx sarcoid   RADIOLOGY WITH ANESTHESIA Left 05/17/2016   Procedure: left renal ablation;  Surgeon: Aletta Edouard, MD;  Location: [facility];  Service: Radiology;  Laterality: Left;       Home Medications    Prior to Admission medications   Medication Sig Start Date End Date Taking? Authorizing Provider  doxycycline (VIBRAMYCIN) 100 MG capsule Take 1 capsule (100 mg total) by mouth 2 (two) times daily for 7 days. 01/07/21 01/14/21 Yes LampteyMyrene Galas, MD  aspirin 81 MG tablet Take 81 mg by mouth every Monday, Wednesday, and Friday.    [provider]  atorvastatin (LIPITOR) 40 MG tablet TAKE 1 TABLET BY MOUTH IN  THE EVENING Patient taking differently: Take 40 mg by mouth every evening. 10/23/20   Troy Sine, MD  brimonidine (ALPHAGAN) 0.2 % ophthalmic solution Place 1 drop into both eyes 2 (two) times daily.     [provider]  clopidogrel (PLAVIX) 75 MG tablet TAKE 1 TABLET BY MOUTH  DAILY Patient taking differently: Take 75 mg by mouth daily. 09/04/20   Troy Sine, MD  dorzolamide-timolol (COSOPT) 22.3-6.8 MG/ML ophthalmic solution Place 1 drop into both eyes 2 (two) times daily.    [provider]  furosemide (LASIX) 20 MG tablet Take 1 tablet (20 mg total) by mouth 2 (two) times daily. 11/22/20   Troy Sine, MD  guaiFENesin (MUCINEX) 600 MG 12 hr tablet Take 1,200 mg by mouth daily.     [provider]  isosorbide mononitrate (IMDUR) 120 MG 24 hr tablet Take 60 mg by mouth 2 (two) times daily. 10/17/20   [provider]  isosorbide mononitrate (IMDUR) 60 MG 24 hr tablet TAKE 1.5 TABLETS ('90MG'$ ) BY MOUTH IN  THE  MORNING AND ONE-HALF TABLET ('30MG'$ ) BY MOUTH AT NIGHT. Patient not taking: Reported on 01/04/2021 09/12/20   Troy Sine, MD  levothyroxine (SYNTHROID) 125 MCG tablet Take 125 mcg by mouth daily before breakfast.    [provider]  losartan (COZAAR) 100 MG tablet TAKE 1 TABLET BY MOUTH  DAILY Patient taking differently: Take 100 mg by mouth daily. 01/31/20   Troy Sine, MD  metoprolol tartrate (LOPRESSOR) 25 MG tablet TAKE 1 TABLET BY MOUTH IN  THE MORNING AND 1/2 TABLET  IN THE EVENING Patient taking differently: Take 12.5-25 mg by mouth See admin instructions. 25 mg in the morning  12.5 mg in the evening 08/14/20   Troy Sine, MD  mometasone (ELOCON) 0.1 % ointment Apply 1 application topically daily.     [provider]  Multiple Vitamin (MULTIVITAMIN) tablet Take 1 tablet by mouth every evening.    [provider]  Multiple Vitamins-Minerals (PRESERVISION/LUTEIN) CAPS Take 1 capsule by mouth 2 (two) times daily.    [provider]  nitroGLYCERIN (NITROSTAT) 0.4 MG SL tablet Place 1 tablet (0.4 mg total) under the tongue every 5 (five) minutes as needed. For chest pain 09/27/20   Troy Sine, MD   pantoprazole (PROTONIX) 40 MG tablet TAKE 1 TABLET BY MOUTH  DAILY BEFORE BREAKFAST Patient taking differently: Take 40 mg by mouth daily. 08/18/20   Mahala Menghini, PA-C  potassium chloride SA (KLOR-CON) 20 MEQ tablet Take 1 tablet (20 mEq total) by mouth daily. 09/30/20   Dunn, Dayna N, PA-C  ROCKLATAN 0.02-0.005 % SOLN Apply to eye. 12/21/19   [provider]  triamcinolone (KENALOG) 0.1 % cream Apply 1 application topically 2 (two) times daily as needed (for irritation).    [provider]  valACYclovir (VALTREX) 1000 MG tablet Take 1,000 mg by mouth 2 (two) times daily. 11/22/20   [provider]  vitamin C (ASCORBIC ACID) 500 MG tablet Take 500 mg by mouth daily.    [provider]  vitamin E 200 UNIT capsule Take 200 Units by mouth daily at 12 noon.     [provider]    Family History Family History  Problem Relation Age of Onset   Heart disease Father        deceased age 41   Stroke Mother    Alzheimer's disease Mother    Heart attack Brother        deceased age 42   Cancer Other        niece   Colon cancer Neg Hx     Social History Social History   Tobacco Use   Smoking status: Former    Packs/day: 0.10    Years: 2.00    Pack years: 0.20    Types: Cigarettes, Cigars    Quit date: 05/06/1970    Years since quitting: 50.7   Smokeless tobacco: Never  Vaping Use   Vaping Use: Never used  Substance Use Topics   Alcohol use: No    Alcohol/week: 0.0 standard drinks   Drug use: No     Allergies   Azithromycin, Doxazosin, Acetaminophen, Atenolol, Hydrocodone, Hydrocodone-acetaminophen, Levofloxacin, Morphine, Penicillins, and Sulfonamide derivatives   Review of Systems Review of Systems  Gastrointestinal: Negative.   Musculoskeletal:  Positive for joint swelling. Negative for myalgias and neck pain.  Skin:  Positive for color change. Negative for rash and wound.  Neurological: Negative.     Physical Exam Triage  Vital Signs ED Triage Vitals  Enc Vitals Group     BP 01/07/21 1126 (!) 156/68     Pulse Rate 01/07/21 1126 69     Resp 01/07/21 1126 20     Temp 01/07/21 1126 98.5 F (36.9 C)     Temp src --      SpO2 01/07/21 1126 98 %     Weight --      Height --      Head Circumference --      Peak Flow --      Pain Score 01/07/21 1124 3     Pain Loc --      Pain Edu? --      Excl. in [place]? --    No data found.  Updated Vital Signs BP (!) 156/68   Pulse 69   Temp 98.5 F (36.9 C)   Resp 20   SpO2 98%   Visual Acuity Right Eye Distance:   Left Eye Distance:   Bilateral Distance:    Right Eye Near:   Left Eye Near:    Bilateral Near:     Physical Exam Vitals and nursing note reviewed.  Constitutional:      General: He is not in acute distress.    Appearance: He is not ill-appearing.  Cardiovascular:     Rate and Rhythm: Normal rate and regular rhythm.  Pulmonary:     Effort: Pulmonary effort is normal.     Breath sounds: Normal breath sounds.  Musculoskeletal:        General: Swelling present. Normal range of motion.     Right lower leg: Edema present.  Skin:    General: Skin is warm.     Capillary Refill: Capillary refill takes less than 2 seconds.     Coloration: Skin is not jaundiced.     Findings: Erythema present. No lesion.  Neurological:     Mental Status: He is alert.     UC Treatments / Results  Labs (all labs ordered are listed, but only abnormal results are displayed) Labs Reviewed - No data to display  EKG   Radiology US Venous Img Lower Unilateral Right (DVT)  Result Date: 01/05/2021 CLINICAL DATA:  Right lower extremity ankle pain for 2 weeks, elevated D-dimer EXAM: RIGHT LOWER EXTREMITY VENOUS DOPPLER ULTRASOUND TECHNIQUE: Gray-scale sonography with graded compression, as well as color Doppler and duplex ultrasound were performed to evaluate the lower extremity deep venous systems from the level of the common femoral vein and including the common  femoral, femoral, profunda femoral, popliteal and calf veins including the posterior tibial, peroneal and gastrocnemius veins when visible. The superficial great saphenous vein was also interrogated. Spectral Doppler was utilized to evaluate flow at rest and with distal augmentation maneuvers in the common femoral, femoral and popliteal veins. COMPARISON:  None. FINDINGS: Contralateral Common Femoral Vein: Respiratory phasicity is normal and symmetric with the symptomatic side. No evidence of thrombus. Normal compressibility. Common Femoral Vein: No evidence of thrombus. Normal compressibility, respiratory phasicity and response to augmentation. Saphenofemoral Junction: No evidence of thrombus. Normal compressibility and flow on color Doppler imaging. Profunda Femoral Vein: No evidence of thrombus. Normal compressibility and flow on color Doppler imaging. Femoral Vein: No evidence of thrombus. Normal compressibility, respiratory phasicity and response to augmentation. Popliteal Vein: No evidence of thrombus. Normal compressibility, respiratory phasicity and response to augmentation. Calf Veins: No evidence of thrombus. Normal compressibility and flow on color Doppler imaging. Other Findings:  Peripheral subcutaneous ankle edema noted. IMPRESSION: No evidence of deep venous  thrombosis. Electronically Signed   By: Jerilynn Mages.  Shick M.D.   On: 01/05/2021 15:07    Procedures Procedures (including critical care time)  Medications Ordered in UC Medications - No data to display  Initial Impression / Assessment and Plan / UC Course  I have reviewed the triage vital signs and the nursing notes.  Pertinent labs & imaging results that were available during my care of the patient were reviewed by me and considered in my medical decision making (see chart for details).     1.  Cellulitis of the right leg, Doxycycline 100 mg twice daily for 7 days Elevation of the right lower extremity is advised Patient is advised to  take Lasix as recommended If symptoms worsen please return to the urgent care for reevaluation. Final Clinical Impressions(s) / UC Diagnoses   Final diagnoses:  Cellulitis of leg, right     Discharge Instructions      Please take your medication as prescribed Please take your lasix as directed Elevate your leg to help with swelling Please wear your compression stocking once the swelling improves. Return to the urgent care if symptoms persist.   ED Prescriptions     Medication Sig Dispense Auth. Provider   doxycycline (VIBRAMYCIN) 100 MG capsule Take 1 capsule (100 mg total) by mouth 2 (two) times daily for 7 days. 14 capsule Declan Adamson, Myrene Galas, MD      PDMP not reviewed this encounter.   Chase Picket, MD 01/07/21 [phone]

## 2021-01-09 DIAGNOSIS — H401123 Primary open-angle glaucoma, left eye, severe stage: Secondary | ICD-10-CM | POA: Diagnosis not present

## 2021-01-09 DIAGNOSIS — I2510 Atherosclerotic heart disease of native coronary artery without angina pectoris: Secondary | ICD-10-CM | POA: Diagnosis not present

## 2021-01-09 DIAGNOSIS — H401113 Primary open-angle glaucoma, right eye, severe stage: Secondary | ICD-10-CM | POA: Diagnosis not present

## 2021-01-09 DIAGNOSIS — Z7902 Long term (current) use of antithrombotics/antiplatelets: Secondary | ICD-10-CM | POA: Diagnosis not present

## 2021-01-09 DIAGNOSIS — I509 Heart failure, unspecified: Secondary | ICD-10-CM | POA: Diagnosis not present

## 2021-01-09 DIAGNOSIS — Z882 Allergy status to sulfonamides status: Secondary | ICD-10-CM | POA: Diagnosis not present

## 2021-01-09 DIAGNOSIS — I10 Essential (primary) hypertension: Secondary | ICD-10-CM | POA: Diagnosis not present

## 2021-01-09 DIAGNOSIS — I110 Hypertensive heart disease with heart failure: Secondary | ICD-10-CM | POA: Diagnosis not present

## 2021-01-09 DIAGNOSIS — Z951 Presence of aortocoronary bypass graft: Secondary | ICD-10-CM | POA: Diagnosis not present

## 2021-01-09 DIAGNOSIS — Z79899 Other long term (current) drug therapy: Secondary | ICD-10-CM | POA: Diagnosis not present

## 2021-01-09 DIAGNOSIS — Z880 Allergy status to penicillin: Secondary | ICD-10-CM | POA: Diagnosis not present

## 2021-01-09 DIAGNOSIS — Z885 Allergy status to narcotic agent status: Secondary | ICD-10-CM | POA: Diagnosis not present

## 2021-01-10 ENCOUNTER — Ambulatory Visit (HOSPITAL_COMMUNITY): Payer: Medicare Other | Admitting: Hematology

## 2021-01-13 ENCOUNTER — Other Ambulatory Visit: Payer: Self-pay | Admitting: Cardiovascular Disease

## 2021-01-13 NOTE — Progress Notes (Signed)
[name], [place] [address]   CLINIC:  Medical Oncology/Hematology  PCP:  [facility], [place] / [place] [address]  [phone]  REASON FOR VISIT:  Follow-up for monoclonal gammopathy and anemia  PRIOR THERAPY: none  CURRENT THERAPY: surveillance  INTERVAL HISTORY:  Dale [name], a 87 y.o. male, returns for routine follow-up for his monoclonal gammopathy and anemia. Dale [name] was last seen on 12/07/2020.  Today he reports feeling well, and today he is accompanied by his son. He reports right ankle swelling.   REVIEW OF SYSTEMS:  Review of Systems  Constitutional:  Negative for appetite change (75%).  Cardiovascular:  Positive for leg swelling (R ankle) and palpitations.  All other systems reviewed and are negative.  PAST MEDICAL/SURGICAL HISTORY:  Past Medical History:  Diagnosis Date   Allergic rhinitis    Anal fissure    Anginal pain (HCC)    Asthma    Cancer (HCC)    skin   Chest pain    CHF (congestive heart failure) ([facility])    Coronary heart disease    s/p stenting. cath in 01/2012 noncritical occlusion   Dysrhythmia    1st degree heart block   GERD (gastroesophageal reflux disease)    Glaucoma    Hiatal hernia    Hyperlipidemia    Hypertension    Hypothyroidism    Idiopathic thrombocytopenic purpura ([place]) 2002   Macular degeneration    Nephrolithiasis    PUD (peptic ulcer disease)    remote   Sarcoidosis    pulmonary   Schatzki's ring    Past Surgical History:  Procedure Laterality Date   cardiac stents     COLONOSCOPY  10/30/2006   Normal rectum, sigmoid diverticula.Remainder of colonic mucosa appeared normal.   CORONARY ANGIOPLASTY WITH STENT PLACEMENT     about 10 years ago per pt (around 2007)   [place], URETEROSCOPY AND STENT PLACEMENT Left 06/16/2017   Procedure: CYSTOSCOPY WITH RETROGRADE PYELOGRAM, URETEROSCOPY,STONE EXTRACTION  AND STENT  PLACEMENT;  Surgeon: Franchot Gallo, MD;  Location: [facility];  Service: Urology;  Laterality: Left;   ESOPHAGOGASTRODUODENOSCOPY  06/19/2004   Two esophageal rings and esophageal web as described above.  All of these were disrupted by passing 56-French Venia Minks dilator/ Candida esophagitis,which appears to be incidental given history of   antibiotic use, but nevertheless will be treated.   ESOPHAGOGASTRODUODENOSCOPY  10/30/2006   Distal tandem esophageal ring status post dilation disruption as  described above.  Otherwise normal esophagus/  Small hiatal hernia otherwise normal stomach, D1 and D2   ESOPHAGOGASTRODUODENOSCOPY N/A 03/22/2015   Dr.Rourk- noncritical schatzki's ring and hiatal hernia-o/w normal EGD.    ESOPHAGOGASTRODUODENOSCOPY (EGD) WITH ESOPHAGEAL DILATION  03/04/2012   RMR- schatzki's ring, hiatal hernia, polypoid gastric mucosa, bx= minimally active gastritis.   HOLMIUM LASER APPLICATION Left 05/20/7260   Procedure: HOLMIUM LASER APPLICATION;  Surgeon: Franchot Gallo, MD;  Location: [facility];  Service: Urology;  Laterality: Left;   IR GENERIC HISTORICAL  03/06/2016   IR RADIOLOGIST EVAL & MGMT 03/06/2016 Aletta Edouard, MD [facility] INTERV RAD   IR GENERIC HISTORICAL  06/18/2016   IR RADIOLOGIST EVAL & MGMT 06/18/2016 Aletta Edouard, MD [facility] INTERV RAD   IR RADIOLOGIST EVAL & MGMT  10/01/2016   IR RADIOLOGIST EVAL & MGMT  10/15/2017   IR RADIOLOGIST EVAL & MGMT  12/24/2018   IR RADIOLOGIST EVAL & MGMT  01/04/2020   LEFT HEART CATH N/A 02/02/2012  Procedure: LEFT HEART CATH;  Surgeon: Lorretta Harp, MD;  Location: [facility] CATH LAB;  Service: Cardiovascular;  Laterality: N/A;   MEDIASTINOSCOPY     for dx sarcoid   RADIOLOGY WITH ANESTHESIA Left 05/17/2016   Procedure: left renal ablation;  Surgeon: Aletta Edouard, MD;  Location: [facility];  Service: Radiology;  Laterality: Left;    SOCIAL HISTORY:  Social History   Socioeconomic History   Marital status: Married    Spouse name: Not  on file   Number of children: 1   Years of education: Not on file   Highest education level: Not on file  Occupational History   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    Employer: RETIRED  Tobacco Use   Smoking status: Former    Packs/day: 0.10    Years: 2.00    Pack years: 0.20    Types: Cigarettes, Cigars    Quit date: 05/06/1970    Years since quitting: 50.7   Smokeless tobacco: Never  Vaping Use   Vaping Use: Never used  Substance and Sexual Activity   Alcohol use: No    Alcohol/week: 0.0 standard drinks   Drug use: No   Sexual activity: Never  Other Topics Concern   Not on file  Social History Narrative   Not on file   Social Determinants of Health   Financial Resource Strain: Not on file  Food Insecurity: Not on file  Transportation Needs: Not on file  Physical Activity: Not on file  Stress: Not on file  Social Connections: Not on file  Intimate Partner Violence: Not on file    FAMILY HISTORY:  Family History  Problem Relation Age of Onset   Heart disease Father        deceased age 76   Stroke Mother    Alzheimer's disease Mother    Heart attack Brother        deceased age 76   Cancer Other        niece   Colon cancer Neg Hx     CURRENT MEDICATIONS:  Current Outpatient Medications  Medication Sig Dispense Refill   aspirin 81 MG tablet Take 81 mg by mouth every Monday, Wednesday, and Friday.     atorvastatin (LIPITOR) 40 MG tablet TAKE 1 TABLET BY MOUTH IN  THE EVENING (Patient taking differently: Take 40 mg by mouth every evening.) 90 tablet 3   brimonidine (ALPHAGAN) 0.2 % ophthalmic solution Place 1 drop into both eyes 2 (two) times daily.     clopidogrel (PLAVIX) 75 MG tablet TAKE 1 TABLET BY MOUTH  DAILY (Patient taking differently: Take 75 mg by mouth daily.) 90 tablet 1   dorzolamide-timolol (COSOPT) 22.3-6.8 MG/ML ophthalmic solution Place 1 drop into both eyes 2 (two) times daily.     doxycycline (VIBRAMYCIN) 100 MG  capsule Take 1 capsule (100 mg total) by mouth 2 (two) times daily for 7 days. 14 capsule 0   furosemide (LASIX) 20 MG tablet Take 1 tablet (20 mg total) by mouth 2 (two) times daily. 180 tablet 3   guaiFENesin (MUCINEX) 600 MG 12 hr tablet Take 1,200 mg by mouth daily.      isosorbide mononitrate (IMDUR) 120 MG 24 hr tablet Take 60 mg by mouth 2 (two) times daily.     isosorbide mononitrate (IMDUR) 60 MG 24 hr tablet TAKE 1.5 TABLETS (90MG) BY MOUTH IN  THE MORNING AND ONE-HALF TABLET (30MG) BY MOUTH AT NIGHT. (Patient not taking: Reported on  01/04/2021) 180 tablet 1   levothyroxine (SYNTHROID) 125 MCG tablet Take 125 mcg by mouth daily before breakfast.     losartan (COZAAR) 100 MG tablet TAKE 1 TABLET BY MOUTH  DAILY (Patient taking differently: Take 100 mg by mouth daily.) 90 tablet 3   metoprolol tartrate (LOPRESSOR) 25 MG tablet TAKE 1 TABLET BY MOUTH IN  THE MORNING AND 1/2 TABLET  IN THE EVENING (Patient taking differently: Take 12.5-25 mg by mouth See admin instructions. 25 mg in the morning  12.5 mg in the evening) 135 tablet 3   mometasone (ELOCON) 0.1 % ointment Apply 1 application topically daily.      Multiple Vitamin (MULTIVITAMIN) tablet Take 1 tablet by mouth every evening.     Multiple Vitamins-Minerals (PRESERVISION/LUTEIN) CAPS Take 1 capsule by mouth 2 (two) times daily.     nitroGLYCERIN (NITROSTAT) 0.4 MG SL tablet Place 1 tablet (0.4 mg total) under the tongue every 5 (five) minutes as needed. For chest pain 25 tablet 2   pantoprazole (PROTONIX) 40 MG tablet TAKE 1 TABLET BY MOUTH  DAILY BEFORE BREAKFAST (Patient taking differently: Take 40 mg by mouth daily.) 90 tablet 3   potassium chloride SA (KLOR-CON) 20 MEQ tablet Take 1 tablet (20 mEq total) by mouth daily. 30 tablet 0   ROCKLATAN 0.02-0.005 % SOLN Apply to eye.     triamcinolone (KENALOG) 0.1 % cream Apply 1 application topically 2 (two) times daily as needed (for irritation).     valACYclovir (VALTREX) 1000 MG tablet  Take 1,000 mg by mouth 2 (two) times daily.     vitamin C (ASCORBIC ACID) 500 MG tablet Take 500 mg by mouth daily.     vitamin E 200 UNIT capsule Take 200 Units by mouth daily at 12 noon.      No current facility-administered medications for this visit.    ALLERGIES:  Allergies  Allergen Reactions   Azithromycin Other (See Comments)    Sore mouth and fever blisters around mouth, sores in nose area as well   Doxazosin Shortness Of Breath   Acetaminophen Other (See Comments)    REACTION: UNKNOWN REACTION   Atenolol Other (See Comments)    REACTION: UNKNOWN REACTION   Hydrocodone Nausea And Vomiting   Hydrocodone-Acetaminophen Nausea Only   Levofloxacin Other (See Comments)    Caused stomach problems.   Morphine Other (See Comments)    "made me crazy"   Penicillins Nausea And Vomiting and Other (See Comments)    Has patient had a PCN reaction causing immediate rash, facial/tongue/throat swelling, SOB or lightheadedness with hypotension: No Has patient had a PCN reaction causing severe rash involving mucus membranes or skin necrosis: No Has patient had a PCN reaction that required hospitalization No Has patient had a PCN reaction occurring within the last 10 years: No If all of the above answers are "NO", then may proceed with Cephalosporin use.    Sulfonamide Derivatives Nausea And Vomiting    PHYSICAL EXAM:  Performance status (ECOG): 0 - Asymptomatic  There were no vitals filed for this visit. Wt Readings from Last 3 Encounters:  12/18/20 131 lb 9.8 oz (59.7 kg)  12/07/20 131 lb 9.8 oz (59.7 kg)  12/01/20 133 lb 6.4 oz (60.5 kg)   Physical Exam Vitals reviewed.  Constitutional:      Appearance: Normal appearance.  Cardiovascular:     Rate and Rhythm: Normal rate and regular rhythm.     Pulses: Normal pulses.     Heart sounds: Normal heart  sounds.  Pulmonary:     Effort: Pulmonary effort is normal.     Breath sounds: Normal breath sounds.  Neurological:      General: No focal deficit present.     Mental Status: He is alert and oriented to person, place, and time.  Psychiatric:        Mood and Affect: Mood normal.        Behavior: Behavior normal.    LABORATORY DATA:  I have reviewed the labs as listed.  CBC Latest Ref Rng & Units 01/04/2021 12/18/2020 12/01/2020  WBC 4.0 - 10.5 K/uL 10.8(H) 5.2 4.9  Hemoglobin 13.0 - 17.0 g/dL 8.6(L) 8.9(L) 8.5(L)  Hematocrit 39.0 - 52.0 % 26.0(L) 27.0(L) 26.4(L)  Platelets 150 - 400 K/uL 149(L) 154 134(L)   CMP Latest Ref Rng & Units 01/04/2021 12/01/2020 01/12/2020  Glucose 70 - 99 mg/dL 112(H) 100(H) 107(H)  BUN 8 - 23 mg/dL 21 25(H) 29(H)  Creatinine 0.61 - 1.24 mg/dL 0.90 0.90 0.95  Sodium 135 - 145 mmol/L 134(L) 134(L) 136  Potassium 3.5 - 5.1 mmol/L 4.0 3.8 4.5  Chloride 98 - 111 mmol/L 103 104 105  CO2 22 - 32 mmol/L '24 26 24  ' Calcium 8.9 - 10.3 mg/dL 9.2 8.9 8.7(L)  Total Protein 6.5 - 8.1 g/dL 8.6(H) 8.4(H) -  Total Bilirubin 0.3 - 1.2 mg/dL 0.9 0.7 -  Alkaline Phos 38 - 126 U/L 69 75 -  AST 15 - 41 U/L 24 22 -  ALT 0 - 44 U/L 22 22 -      Component Value Date/Time   RBC 2.19 (L) 01/04/2021 0057   MCV 118.7 (H) 01/04/2021 0057   MCV 107 (H) 10/26/2019 1013   MCH 39.3 (H) 01/04/2021 0057   MCHC 33.1 01/04/2021 0057   RDW 15.5 01/04/2021 0057   RDW 12.7 10/26/2019 1013   LYMPHSABS 1.0 01/04/2021 0057   LYMPHSABS CANCELED 09/08/2015 0933   MONOABS 1.4 (H) 01/04/2021 0057   EOSABS 0.1 01/04/2021 0057   EOSABS CANCELED 09/08/2015 0933   BASOSABS 0.1 01/04/2021 0057   BASOSABS CANCELED 09/08/2015 0933    DIAGNOSTIC IMAGING:  I have independently reviewed the scans and discussed with the patient. DG Chest 2 View  Result Date: 01/04/2021 CLINICAL DATA:  Syncope. EXAM: CHEST - 2 VIEW COMPARISON:  Chest radiograph dated 10/16/2020. chest CT dated 04/07/2017. FINDINGS: A 2 cm apparent rounded density over the right upper lobe, likely artifactual and related to costochondral junction. Underlying  mass is not excluded but much less likely. This was not seen on the prior radiograph of 11/15/2020. Attention on follow-up imaging recommended. Bilateral streaky densities, chronic. No focal consolidation, pleural effusion or pneumothorax. Probable small calcified granuloma in the lower lung. Stable cardiac silhouette. Slight prominence of the hilar vasculature may represent pulmonary hypertension. No acute osseous pathology. Degenerative changes of the spine. IMPRESSION: 1. No acute cardiopulmonary process. 2. Probable artifactual 2 cm rounded density over the right upper lobe. Attention on follow-up imaging recommended. Electronically Signed   By: Anner Crete M.D.   On: 01/04/2021 01:52   DG Ankle Complete Right  Result Date: 01/04/2021 CLINICAL DATA:  Fall, ankle pain EXAM: RIGHT ANKLE - COMPLETE 3+ VIEW COMPARISON:  None. FINDINGS: No fracture or dislocation is seen. The ankle mortise is intact. The base of the fifth metatarsal is unremarkable. Visualized soft tissues are within normal limits. Vascular calcifications. IMPRESSION: Negative. Electronically Signed   By: Julian Hy M.D.   On: 01/04/2021 02:38   CT  HEAD WO CONTRAST (5MM)  Result Date: 01/04/2021 CLINICAL DATA:  Fall EXAM: CT HEAD WITHOUT CONTRAST CT CERVICAL SPINE WITHOUT CONTRAST TECHNIQUE: Multidetector CT imaging of the head and cervical spine was performed following the standard protocol without intravenous contrast. Multiplanar CT image reconstructions of the cervical spine were also generated. COMPARISON:  None. FINDINGS: CT HEAD FINDINGS Brain: There is no mass, hemorrhage or extra-axial collection. There is generalized atrophy without lobar predilection. There is hypoattenuation of the periventricular white matter, most commonly indicating chronic ischemic microangiopathy. Vascular: Atherosclerotic calcification of the vertebral and internal carotid arteries at the skull base. No abnormal hyperdensity of the major  intracranial arteries or dural venous sinuses. Skull: The visualized skull base, calvarium and extracranial soft tissues are normal. Sinuses/Orbits: Soft tissue filling the left maxillary sinus, likely antrochoanal polyp. The orbits are normal. CT CERVICAL SPINE FINDINGS Alignment: No static subluxation. Facets are aligned. Occipital condyles are normally positioned. Skull base and vertebrae: No acute fracture. Soft tissues and spinal canal: No prevertebral fluid or swelling. No visible canal hematoma. Disc levels: No advanced spinal canal or neural foraminal stenosis. Upper chest: No pneumothorax, pulmonary nodule or pleural effusion. Other: Normal visualized paraspinal cervical soft tissues. IMPRESSION: 1. Chronic ischemic microangiopathy and generalized atrophy without acute intracranial abnormality. 2. No acute fracture or static subluxation of the cervical spine. 3. Soft tissue filling the left maxillary sinus, likely antrochoanal polyp. Electronically Signed   By: Ulyses Jarred M.D.   On: 01/04/2021 03:01   CT Angio Chest PE W and/or Wo Contrast  Result Date: 01/04/2021 CLINICAL DATA:  Elevated D-dimer EXAM: CT ANGIOGRAPHY CHEST WITH CONTRAST TECHNIQUE: Multidetector CT imaging of the chest was performed using the standard protocol during bolus administration of intravenous contrast. Multiplanar CT image reconstructions and MIPs were obtained to evaluate the vascular anatomy. CONTRAST:  69m OMNIPAQUE IOHEXOL 350 MG/ML SOLN COMPARISON:  04/07/2017 FINDINGS: Cardiovascular: Satisfactory opacification of the pulmonary arteries to the segmental level. No evidence of pulmonary embolism. Normal heart size. No pericardial effusion. Interlobar right and left lower lobar pulmonary arteries are wasted with scar-like appearance, possibly related to prior granulomatous disease given nodal changes and adjacent parenchymal bands. Aortic and coronary atherosclerosis. Mediastinum/Nodes: Calcified mediastinal lymph nodes  attributed to chronic granulomatous disease. Lungs/Pleura: Bands of opacity following the major fissures. Mild dependent ground-glass opacity attributed to atelectasis. There is no edema, consolidation, effusion, or pneumothorax. Upper Abdomen: 2.8 cm left hepatic cyst. Musculoskeletal: No acute or aggressive finding. Bridging thoracic osteophytes Review of the MIP images confirms the above findings. IMPRESSION: 1. Negative for pulmonary embolism or other acute finding. 2. Remote granulomatous disease with stable scarring. 3. Atherosclerosis, including the coronary arteries. Aortic Atherosclerosis (ICD10-I70.0). Electronically Signed   By: JMonte FantasiaM.D.   On: 01/04/2021 04:15   CT Cervical Spine Wo Contrast  Result Date: 01/04/2021 CLINICAL DATA:  Fall EXAM: CT HEAD WITHOUT CONTRAST CT CERVICAL SPINE WITHOUT CONTRAST TECHNIQUE: Multidetector CT imaging of the head and cervical spine was performed following the standard protocol without intravenous contrast. Multiplanar CT image reconstructions of the cervical spine were also generated. COMPARISON:  None. FINDINGS: CT HEAD FINDINGS Brain: There is no mass, hemorrhage or extra-axial collection. There is generalized atrophy without lobar predilection. There is hypoattenuation of the periventricular white matter, most commonly indicating chronic ischemic microangiopathy. Vascular: Atherosclerotic calcification of the vertebral and internal carotid arteries at the skull base. No abnormal hyperdensity of the major intracranial arteries or dural venous sinuses. Skull: The visualized skull base, calvarium and extracranial  soft tissues are normal. Sinuses/Orbits: Soft tissue filling the left maxillary sinus, likely antrochoanal polyp. The orbits are normal. CT CERVICAL SPINE FINDINGS Alignment: No static subluxation. Facets are aligned. Occipital condyles are normally positioned. Skull base and vertebrae: No acute fracture. Soft tissues and spinal canal: No  prevertebral fluid or swelling. No visible canal hematoma. Disc levels: No advanced spinal canal or neural foraminal stenosis. Upper chest: No pneumothorax, pulmonary nodule or pleural effusion. Other: Normal visualized paraspinal cervical soft tissues. IMPRESSION: 1. Chronic ischemic microangiopathy and generalized atrophy without acute intracranial abnormality. 2. No acute fracture or static subluxation of the cervical spine. 3. Soft tissue filling the left maxillary sinus, likely antrochoanal polyp. Electronically Signed   By: Ulyses Jarred M.D.   On: 01/04/2021 03:01   US Venous Img Lower Unilateral Right (DVT)  Result Date: 01/05/2021 CLINICAL DATA:  Right lower extremity ankle pain for 2 weeks, elevated D-dimer EXAM: RIGHT LOWER EXTREMITY VENOUS DOPPLER ULTRASOUND TECHNIQUE: Gray-scale sonography with graded compression, as well as color Doppler and duplex ultrasound were performed to evaluate the lower extremity deep venous systems from the level of the common femoral vein and including the common femoral, femoral, profunda femoral, popliteal and calf veins including the posterior tibial, peroneal and gastrocnemius veins when visible. The superficial great saphenous vein was also interrogated. Spectral Doppler was utilized to evaluate flow at rest and with distal augmentation maneuvers in the common femoral, femoral and popliteal veins. COMPARISON:  None. FINDINGS: Contralateral Common Femoral Vein: Respiratory phasicity is normal and symmetric with the symptomatic side. No evidence of thrombus. Normal compressibility. Common Femoral Vein: No evidence of thrombus. Normal compressibility, respiratory phasicity and response to augmentation. Saphenofemoral Junction: No evidence of thrombus. Normal compressibility and flow on color Doppler imaging. Profunda Femoral Vein: No evidence of thrombus. Normal compressibility and flow on color Doppler imaging. Femoral Vein: No evidence of thrombus. Normal  compressibility, respiratory phasicity and response to augmentation. Popliteal Vein: No evidence of thrombus. Normal compressibility, respiratory phasicity and response to augmentation. Calf Veins: No evidence of thrombus. Normal compressibility and flow on color Doppler imaging. Other Findings:  Peripheral subcutaneous ankle edema noted. IMPRESSION: No evidence of deep venous thrombosis. Electronically Signed   By: Jerilynn Mages.  Shick M.D.   On: 01/05/2021 15:07   CT Biopsy  Result Date: 12/18/2020 INDICATION: Please perform CT-guided bone marrow biopsy for evaluation of multiple myeloma. EXAM: CT-GUIDED BONE MARROW BIOPSY AND ASPIRATION MEDICATIONS: None ANESTHESIA/SEDATION: Fentanyl 100 mcg IV; Versed 2 mg IV Sedation Time: 10 Minutes; The patient was continuously monitored during the procedure by the interventional radiology nurse under my direct supervision. COMPLICATIONS: None immediate. PROCEDURE: Informed consent was obtained from the patient following an explanation of the procedure, risks, benefits and alternatives. The patient understands, agrees and consents for the procedure. All questions were addressed. A time out was performed prior to the initiation of the procedure. The patient was positioned prone and non-contrast localization CT was performed of the pelvis to demonstrate the iliac marrow spaces. The operative site was prepped and draped in the usual sterile fashion. Under sterile conditions and local anesthesia, a 22 gauge spinal needle was utilized for procedural planning. Next, an 11 gauge coaxial bone biopsy needle was advanced into the left iliac marrow space. Needle position was confirmed with CT imaging. Initially, a bone marrow aspiration was performed. Next, a bone marrow biopsy was obtained with the 11 gauge outer bone marrow device. Samples were prepared with the cytotechnologist and deemed adequate. The needle was removed and  superficial hemostasis was obtained with manual compression. A  dressing was applied. The patient tolerated the procedure well without immediate post procedural complication. IMPRESSION: Successful CT guided left iliac bone marrow aspiration and core biopsy. Electronically Signed   By: Sandi Mariscal M.D.   On: 12/18/2020 12:59   CT BONE MARROW BIOPSY & ASPIRATION  Result Date: 12/18/2020 INDICATION: Please perform CT-guided bone marrow biopsy for evaluation of multiple myeloma. EXAM: CT-GUIDED BONE MARROW BIOPSY AND ASPIRATION MEDICATIONS: None ANESTHESIA/SEDATION: Fentanyl 100 mcg IV; Versed 2 mg IV Sedation Time: 10 Minutes; The patient was continuously monitored during the procedure by the interventional radiology nurse under my direct supervision. COMPLICATIONS: None immediate. PROCEDURE: Informed consent was obtained from the patient following an explanation of the procedure, risks, benefits and alternatives. The patient understands, agrees and consents for the procedure. All questions were addressed. A time out was performed prior to the initiation of the procedure. The patient was positioned prone and non-contrast localization CT was performed of the pelvis to demonstrate the iliac marrow spaces. The operative site was prepped and draped in the usual sterile fashion. Under sterile conditions and local anesthesia, a 22 gauge spinal needle was utilized for procedural planning. Next, an 11 gauge coaxial bone biopsy needle was advanced into the left iliac marrow space. Needle position was confirmed with CT imaging. Initially, a bone marrow aspiration was performed. Next, a bone marrow biopsy was obtained with the 11 gauge outer bone marrow device. Samples were prepared with the cytotechnologist and deemed adequate. The needle was removed and superficial hemostasis was obtained with manual compression. A dressing was applied. The patient tolerated the procedure well without immediate post procedural complication. IMPRESSION: Successful CT guided left iliac bone marrow  aspiration and core biopsy. Electronically Signed   By: Sandi Mariscal M.D.   On: 12/18/2020 12:59   US Abdomen Limited RUQ (LIVER/GB)  Result Date: 01/04/2021 CLINICAL DATA:  Right upper quadrant abdominal pain EXAM: ULTRASOUND ABDOMEN LIMITED RIGHT UPPER QUADRANT COMPARISON:  CT abdomen/pelvis dated 10/09/2020. FINDINGS: Gallbladder: No gallstones, gallbladder wall thickening, or pericholecystic fluid. Negative sonographic Murphy's sign. Common bile duct: Diameter: 6 mm Liver: Hyperechoic hepatic parenchyma, suggesting hepatic steatosis. 3.0 x 2.8 x 3.2 cm left hepatic cyst, benign. Portal vein is patent on color Doppler imaging with normal direction of blood flow towards the liver. Other: None. IMPRESSION: 3.2 cm left hepatic cysts, benign. Suspected hepatic steatosis. Electronically Signed   By: Julian Hy M.D.   On: 01/04/2021 02:40     ASSESSMENT:  1.  IgG lambda plasma cell myeloma: - Work-up for macrocytic anemia on 12/01/2020 showed M spike of 2.9 g.  Immunofixation IgG lambda. - Lambda light chains elevated at 284.  Light chain ratio was 0.04.  LDH was 163.  Creatinine was 0.9 and calcium 8.9. - Bone marrow biopsy on 12/18/2020-hypercellular marrow for age with 48% atypical plasma cells with lambda light chain restriction. - Chromosome analysis was normal. 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  2.  Macrocytic anemia: - CBC on 12/01/2020 with hemoglobin 8.5 and MCV of 117. - Denies any bleeding per rectum or melena.  3.  Social/family history: - Lives at home with his wife.  He does all ADLs and IADLs.  He even does yard work. - He worked at [employer] for 35 years.  Denies any chemical exposure.  Non-smoker. - No family history of malignancies.   PLAN:  1.  IgG lambda plasma cell myeloma: - We reviewed the bone marrow biopsy from 12/18/2020 results which shows hypercellular marrow for age with  48% atypical plasma cells with lambda light chain restriction.  Chromosome analysis was normal. 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 - However he has unexplained anemia with hemoglobin around 8.5 since July of this year.  He does not have any bleeding issues. - Based on this I have recommended treatment for myeloma.  Given his advanced age we will likely use 2 drug regimen and if he tolerates well we will add a third drug.  We will start him on Revlimid 5 mg 2 weeks on 1 week off.  He has right leg swelling.  Hence would hold off on dexamethasone at this time. - We talked about the side effects of Revlimid in detail.  Literature was given. - We will see him back 2 weeks after start of Revlimid.  2.  Macrocytic anemia: - M19, folic acid, methylmalonic acid and copper levels were normal. - Likely etiology is plasma cell myeloma.  3.  Thromboprophylaxis: - He is on Plavix and takes aspirin 81 mg 3 times weekly.  4.  Right ankle swelling: - He was recently evaluated in the ER for this.  He was given antibiotic which improved redness. - Continue Lasix and elevation.  Doppler was negative reviewed by me.  Orders placed this encounter:  No orders of the defined types were placed in this encounter.  Total time spent is 40 minutes with more than 50% of the time spent face-to-face discussing new diagnosis, treatment plan, counseling and coordination of care.  Derek Jack, MD [place] [phone]   I, Thana Ates, am acting as a scribe for Dr. Derek Jack.  I, Derek Jack MD, have reviewed the above documentation for accuracy and completeness, and I agree with the above.

## 2021-01-15 ENCOUNTER — Inpatient Hospital Stay (HOSPITAL_COMMUNITY): Payer: Medicare Other | Attending: Hematology and Oncology | Admitting: Hematology

## 2021-01-15 VITALS — BP 165/63 | HR 85 | Temp 97.2°F | Resp 17 | Wt 132.2 lb

## 2021-01-15 DIAGNOSIS — E8809 Other disorders of plasma-protein metabolism, not elsewhere classified: Secondary | ICD-10-CM

## 2021-01-15 DIAGNOSIS — E039 Hypothyroidism, unspecified: Secondary | ICD-10-CM | POA: Diagnosis not present

## 2021-01-15 DIAGNOSIS — D539 Nutritional anemia, unspecified: Secondary | ICD-10-CM | POA: Diagnosis not present

## 2021-01-15 DIAGNOSIS — D696 Thrombocytopenia, unspecified: Secondary | ICD-10-CM | POA: Diagnosis not present

## 2021-01-15 DIAGNOSIS — C9000 Multiple myeloma not having achieved remission: Secondary | ICD-10-CM

## 2021-01-15 DIAGNOSIS — D649 Anemia, unspecified: Secondary | ICD-10-CM

## 2021-01-15 DIAGNOSIS — Z79899 Other long term (current) drug therapy: Secondary | ICD-10-CM | POA: Insufficient documentation

## 2021-01-15 HISTORY — DX: Multiple myeloma not having achieved remission: C90.00

## 2021-01-15 MED ORDER — PROCHLORPERAZINE MALEATE 10 MG PO TABS: 10.0000 mg | ORAL_TABLET | Freq: Four times a day (QID) | ORAL | 3 refills | Status: DC | PRN

## 2021-01-15 NOTE — Patient Instructions (Addendum)
[place] at [facility] Discharge Instructions  You were seen today by Dr. Delton Coombes.   Dr. Delton Coombes reviewed your recent bone marrow biopsy results. It revealed about 48% of the cells were myeloma cells. The [place] panel shows that it is a slightly aggressive. It is considered Multiple Myeloma because of his ongoing anemia since July.  At this point, treatment would be recommended. Treatment includes a combination of pills to be taken at home and injections to be given in the clinic. The pill to be taken at home is known as Revlimid. You would take a pill for two weeks on followed by two weeks off. The other pill would be decadron, this is a steroid. It can cause increased swelling so it will not be introduced immediately.  Revlimid is filled at a specialty pharmacy and will be shipped to your home. Someone will need to be home to sign for the medication.  Dr. Delton Coombes will see you back in for labs and follow up 2 weeks after starting Revlimid.   Thank you for choosing [place] at [facility] to provide your oncology and hematology care.  To afford each patient quality time with our provider, please arrive at least 15 minutes before your scheduled appointment time.   If you have a lab appointment with the [place] [place] please come in thru the Main Entrance and check in at the main information desk  You need to re-schedule your appointment should you arrive 10 or more minutes late.  We strive to give you quality time with our providers, and arriving late affects you and other patients whose appointments are after yours.  Also, if you no show three or more times for appointments you may be dismissed from the clinic at the providers discretion.     Again, thank you for choosing [facility].  Our hope is that these requests will decrease the amount of time that you wait before being seen by our physicians.        _____________________________________________________________  Should you have questions after your visit to [facility], please contact our office at [phone] between the hours of 8:00 a.m. and 4:30 p.m.  Voicemails left after 4:00 p.m. will not be returned until the following business day.  For prescription refill requests, have your pharmacy contact our office and allow 72 hours.    Cancer Center Support Programs:   > Cancer Support Group  2nd Tuesday of the month 1pm-2pm, Journey Room

## 2021-01-16 ENCOUNTER — Ambulatory Visit (INDEPENDENT_AMBULATORY_CARE_PROVIDER_SITE_OTHER): Payer: Medicare Other | Admitting: Physician Assistant

## 2021-01-16 ENCOUNTER — Encounter: Payer: Self-pay | Admitting: Physician Assistant

## 2021-01-16 ENCOUNTER — Other Ambulatory Visit (HOSPITAL_COMMUNITY): Payer: Self-pay

## 2021-01-16 ENCOUNTER — Telehealth (HOSPITAL_COMMUNITY): Payer: Self-pay | Admitting: Pharmacist

## 2021-01-16 ENCOUNTER — Encounter (HOSPITAL_COMMUNITY): Payer: Self-pay | Admitting: Lab

## 2021-01-16 ENCOUNTER — Other Ambulatory Visit: Payer: Self-pay

## 2021-01-16 ENCOUNTER — Telehealth (HOSPITAL_COMMUNITY): Payer: Self-pay | Admitting: Pharmacy Technician

## 2021-01-16 VITALS — BP 130/62 | HR 77 | Resp 20 | Ht 68.0 in | Wt 128.0 lb

## 2021-01-16 DIAGNOSIS — C9000 Multiple myeloma not having achieved remission: Secondary | ICD-10-CM | POA: Diagnosis not present

## 2021-01-16 DIAGNOSIS — I2510 Atherosclerotic heart disease of native coronary artery without angina pectoris: Secondary | ICD-10-CM | POA: Diagnosis not present

## 2021-01-16 DIAGNOSIS — I10 Essential (primary) hypertension: Secondary | ICD-10-CM | POA: Diagnosis not present

## 2021-01-16 DIAGNOSIS — R55 Syncope and collapse: Secondary | ICD-10-CM | POA: Diagnosis not present

## 2021-01-16 DIAGNOSIS — E039 Hypothyroidism, unspecified: Secondary | ICD-10-CM | POA: Diagnosis not present

## 2021-01-16 DIAGNOSIS — Z01818 Encounter for other preprocedural examination: Secondary | ICD-10-CM

## 2021-01-16 DIAGNOSIS — E785 Hyperlipidemia, unspecified: Secondary | ICD-10-CM

## 2021-01-16 DIAGNOSIS — D649 Anemia, unspecified: Secondary | ICD-10-CM

## 2021-01-16 MED ORDER — LENALIDOMIDE 5 MG PO CAPS: 5.0000 mg | ORAL_CAPSULE | Freq: Every day | ORAL | 0 refills | Status: DC

## 2021-01-16 NOTE — Progress Notes (Unsigned)
Referral sent to Dr Merlene Laughter.  Records faxed to 9/13

## 2021-01-16 NOTE — Progress Notes (Signed)
Cardiology Office Note:    Date:  01/19/2021   ID:  Dale [name], DOB [birth year], MRN [id number]  PCP:  Redmond School, MD   [facility] Prov[facility] Cardiologist:  Shelva Majestic, MD     Referring MD: Redmond School, MD   No chief complaint on file.   History of Present Illness:    Dale [name] is a 87 y.o. male with a hx of CAD, hypertension, hyperlipidemia, hypothyroidism, renal cell cancer, syncope, ITP and history of peptic ulcer disease.  Patient had initial intervention to RCA in 2000.  He also received a Cypher stent beyond acute marginal of RCA in 2009.  Cardiac catheterization in September 2013 showed a 30% LAD narrowing after the second diagonal, otherwise normal left circumflex artery and a patent RCA stent.  Previous P2Y12 study shows that he response to Plavix therapy.  Echocardiogram in August 2018 showed EF 55 to 60%.  He was seen at [facility] near the end of August 2018 with chest pain.  Myoview at the time showed no reversible ischemia.  Carotid Doppler in October 2018 showed 1 to 39% bilateral disease.  He was seen in June 2021 for vague chest pain that was nitrate responsive.  Isosorbide was further titrated.  In September 2021, he developed a syncopal spell while sitting in the chair.  His wife gave him a dose of sublingual nitroglycerin and he subsequently passed out.  On EMS arrival, blood pressure was 89/65.  His CBG 112.  He was most recently seen by [name] on 09/27/2020 at which time he was doing well.  Since the last visit, patient went to the emergency room on 01/04/2021 after developed syncope while taking a hot shower.  When EMS arrived, they stood the patient up however he became bradycardic, pale, diaphoretic and had another syncopal episode.  EKG showed first-degree AV block similar to priors.  Serial troponin negative x2.  BNP normal.  D-dimer positive, CTA was negative for PE or acute pulmonary issue.  Electrolyte normal.  CT of the head and neck  negative for acute injury.  Right upper quadrant ultrasound negative.  He was treated with IV fluid.  Orthostatic vital sign was reassuring.  More recently, patient went to the ED on 01/07/2021 with leg swelling.  He was treated for cellulitis of the right leg with doxycycline 100 mg twice a day for 7 days.  He presents today for preoperative clearance prior to hernia repair by [facility] surgery.  He continued to have right lower extremity edema, however redness has significantly improved.  Unfortunately patient has been diagnosed with plasma cell myeloma based on recent bone marrow biopsy on 12/18/2020.  He is being followed by Dr. Delton Coombes.  Skeletal survey negative for lytic lesions.  He is also being followed by oncology service due to recent anemia.  There has been no bleeding issue.  He is having second thoughts regarding surgery and does not wish to proceed with surgery at this time.  Given his prior history, advanced age and recent diagnosis, if he was to undergo surgery, he would be at least a moderate risk patient for the intended procedure.  From the cardiac perspective, he has been fairly stable.  He has occasional vague chest discomfort however the frequency has not increased.  He has not had any more passing out spell.  Past Medical History:  Diagnosis Date   Allergic rhinitis    Anal fissure    Anginal pain ([place])    Asthma  Cancer ([place])    skin   Chest pain    CHF (congestive heart failure) ([place])    Coronary heart disease    s/p stenting. cath in 01/2012 noncritical occlusion   Dysrhythmia    1st degree heart block   GERD (gastroesophageal reflux disease)    Glaucoma    Hiatal hernia    Hyperlipidemia    Hypertension    Hypothyroidism    Idiopathic thrombocytopenic purpura ([place]) 2002   Macular degeneration    Nephrolithiasis    PUD (peptic ulcer disease)    remote   Sarcoidosis    pulmonary   Schatzki's ring     Past Surgical History:  Procedure Laterality  Date   cardiac stents     COLONOSCOPY  10/30/2006   Normal rectum, sigmoid diverticula.Remainder of colonic mucosa appeared normal.   CORONARY ANGIOPLASTY WITH STENT PLACEMENT     about 10 years ago per pt (around 2007)   [place], URETEROSCOPY AND STENT PLACEMENT Left 06/16/2017   Procedure: CYSTOSCOPY WITH RETROGRADE PYELOGRAM, URETEROSCOPY,STONE EXTRACTION  AND STENT PLACEMENT;  Surgeon: Franchot Gallo, MD;  Location: [facility];  Service: Urology;  Laterality: Left;   ESOPHAGOGASTRODUODENOSCOPY  06/19/2004   Two esophageal rings and esophageal web as described above.  All of these were disrupted by passing 56-French Venia Minks dilator/ Candida esophagitis,which appears to be incidental given history of   antibiotic use, but nevertheless will be treated.   ESOPHAGOGASTRODUODENOSCOPY  10/30/2006   Distal tandem esophageal ring status post dilation disruption as  described above.  Otherwise normal esophagus/  Small hiatal hernia otherwise normal stomach, D1 and D2   ESOPHAGOGASTRODUODENOSCOPY N/A 03/22/2015   Dr.Rourk- noncritical schatzki's ring and hiatal hernia-o/w normal EGD.    ESOPHAGOGASTRODUODENOSCOPY (EGD) WITH ESOPHAGEAL DILATION  03/04/2012   RMR- schatzki's ring, hiatal hernia, polypoid gastric mucosa, bx= minimally active gastritis.   HOLMIUM LASER APPLICATION Left 4/82/5003   Procedure: HOLMIUM LASER APPLICATION;  Surgeon: Franchot Gallo, MD;  Location: [facility];  Service: Urology;  Laterality: Left;   IR GENERIC HISTORICAL  03/06/2016   IR RADIOLOGIST EVAL & MGMT 03/06/2016 Aletta Edouard, MD [facility] INTERV RAD   IR GENERIC HISTORICAL  06/18/2016   IR RADIOLOGIST EVAL & MGMT 06/18/2016 Aletta Edouard, MD [facility] INTERV RAD   IR RADIOLOGIST EVAL & MGMT  10/01/2016   IR RADIOLOGIST EVAL & MGMT  10/15/2017   IR RADIOLOGIST EVAL & MGMT  12/24/2018   IR RADIOLOGIST EVAL & MGMT  01/04/2020   LEFT HEART CATH N/A 02/02/2012   Procedure: LEFT HEART CATH;  Surgeon:  Lorretta Harp, MD;  Location: [facility] CATH LAB;  Service: Cardiovascular;  Laterality: N/A;   MEDIASTINOSCOPY     for dx sarcoid   RADIOLOGY WITH ANESTHESIA Left 05/17/2016   Procedure: left renal ablation;  Surgeon: Aletta Edouard, MD;  Location: [facility];  Service: Radiology;  Laterality: Left;    Current Medications: Current Meds  Medication Sig   aspirin 81 MG tablet Take 81 mg by mouth every Monday, Wednesday, and Friday.   atorvastatin (LIPITOR) 40 MG tablet TAKE 1 TABLET BY MOUTH IN  THE EVENING (Patient taking differently: Take 40 mg by mouth every evening.)   brimonidine (ALPHAGAN) 0.2 % ophthalmic solution Place 1 drop into both eyes 2 (two) times daily.   clopidogrel (PLAVIX) 75 MG tablet TAKE 1 TABLET BY MOUTH  DAILY (Patient taking differently: Take 75 mg by mouth daily.)   dorzolamide-timolol (COSOPT) 22.3-6.8 MG/ML ophthalmic solution Place 1 drop into  both eyes 2 (two) times daily.   [EXPIRED] doxycycline (VIBRAMYCIN) 100 MG capsule Take 100 mg by mouth 2 (two) times daily.   fluocinonide cream (LIDEX) 7.40 % Apply 1 application topically 2 (two) times daily.   furosemide (LASIX) 40 MG tablet Take 40 mg by mouth daily. Take 40 mg daily   isosorbide mononitrate (IMDUR) 120 MG 24 hr tablet Take 60 mg by mouth 2 (two) times daily.   lenalidomide (REVLIMID) 5 MG capsule Take 1 capsule (5 mg total) by mouth daily. 14 days on, 7 days off   levothyroxine (SYNTHROID) 125 MCG tablet Take 125 mcg by mouth daily before breakfast.   losartan (COZAAR) 100 MG tablet TAKE 1 TABLET BY MOUTH  DAILY   metoprolol tartrate (LOPRESSOR) 25 MG tablet TAKE 1 TABLET BY MOUTH IN  THE MORNING AND 1/2 TABLET  IN THE EVENING (Patient taking differently: Take 12.5-25 mg by mouth See admin instructions. 25 mg in the morning  12.5 mg in the evening)   mometasone (ELOCON) 0.1 % ointment Apply 1 application topically daily.    Multiple Vitamins-Minerals (PRESERVISION/LUTEIN) CAPS Take 1 capsule by mouth 2 (two)  times daily.   nitroGLYCERIN (NITROSTAT) 0.4 MG SL tablet Place 1 tablet (0.4 mg total) under the tongue every 5 (five) minutes as needed. For chest pain   pantoprazole (PROTONIX) 40 MG tablet TAKE 1 TABLET BY MOUTH  DAILY BEFORE BREAKFAST (Patient taking differently: Take 40 mg by mouth daily.)   potassium chloride SA (KLOR-CON) 20 MEQ tablet Take 1 tablet (20 mEq total) by mouth daily.   prednisoLONE acetate (PRED FORTE) 1 % ophthalmic suspension 1 drop 4 (four) times daily. Rt eye 4 times daily   prochlorperazine (COMPAZINE) 10 MG tablet Take 1 tablet (10 mg total) by mouth every 6 (six) hours as needed for nausea or vomiting.   ROCKLATAN 0.02-0.005 % SOLN Apply to eye.   triamcinolone (KENALOG) 0.1 % cream Apply 1 application topically 2 (two) times daily as needed (for irritation).   valACYclovir (VALTREX) 1000 MG tablet Take 1,000 mg by mouth 2 (two) times daily.   vitamin C (ASCORBIC ACID) 500 MG tablet Take 500 mg by mouth daily.   vitamin E 200 UNIT capsule Take 200 Units by mouth daily at 12 noon.      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    Employer: RETIRED  Tobacco Use   Smoking status: Former    Packs/day: 0.10    Years: 2.00    Pack years: 0.20    Types: Cigarettes, Cigars    Quit date: 05/06/1970    Years since quitting: 50.7   Smokeless tobacco: Never  Vaping Use   Vaping Use: Never used  Substance and Sexual Activity   Alcohol use: No    Alcohol/week: 0.0 standard drinks   Drug use: No   Sexual activity: Never  Other Topics Concern   Not on file  Social  History Narrative   Not on file   Social Determinants of Health   Financial Resource Strain: Not on file  Food Insecurity: Not on file  Transportation Needs: Not  on file  Physical Activity: Not on file  Stress: Not on file  Social Connections: Not on file     Family History: The patient's family history includes Alzheimer's disease in his mother; Cancer in an other family member; Heart attack in his brother; Heart disease in his father; Stroke in his mother. There is no history of Colon cancer.  ROS:   Please see the history of present illness.     All other systems reviewed and are negative.  EKGs/Labs/Other Studies Reviewed:    The following studies were reviewed today:  Echo 12/23/2016 LV EF: 55% -   60%   -------------------------------------------------------------------  Indications:      R07.9 Chest pain, unspecified.   -------------------------------------------------------------------  History:   PMH:  Hypothyroidism. First-degree AV block. Renal cell  carcinoma.  Coronary artery disease.  Risk factors:  Hypertension.  Dyslipidemia.   -------------------------------------------------------------------  Study Conclusions   - Left ventricle: The cavity size was normal. Systolic function was    normal. The estimated ejection fraction was in the range of 55%    to 60%. Wall motion was normal; there were no regional wall    motion abnormalities. Left ventricular diastolic function    parameters were normal.   EKG:  EKG is  ordered today.  The ekg ordered today demonstrates normal rhythm, no significant ST-T wave changes  Recent Labs: 01/04/2021: ALT 22; B Natriuretic Peptide 220.6; BUN 21; Creatinine, Ser 0.90; Hemoglobin 8.6; Platelets 149; Potassium 4.0; Sodium 134  Recent Lipid Panel    Component Value Date/Time   CHOL 123 10/26/2019 1013   TRIG 107 10/26/2019 1013   HDL 41 10/26/2019 1013   CHOLHDL 3.0 10/26/2019 1013   CHOLHDL 2.6 01/02/2017 0612   VLDL  10 01/02/2017 0612   LDLCALC 62 10/26/2019 1013     Risk Assessment/Calculations:           Physical Exam:    VS:  BP 130/62 (BP Location: Left Arm, Patient Position: Sitting, Cuff Size: Normal)   Pulse 77   Resp 20   Ht '5\' 8"'  (1.727 m)   Wt 128 lb (58.1 kg)   SpO2 92%   BMI 19.46 kg/m     Wt Readings from Last 3 Encounters:  01/16/21 128 lb (58.1 kg)  01/15/21 132 lb 3.2 oz (60 kg)  12/18/20 131 lb 9.8 oz (59.7 kg)     GEN:  Well nourished, well developed in no acute distress HEENT: Normal NECK: No JVD; No carotid bruits LYMPHATICS: No lymphadenopathy CARDIAC: RRR, no murmurs, rubs, gallops RESPIRATORY:  Clear to auscultation without rales, wheezing or rhonchi  ABDOMEN: Soft, non-tender, non-distended MUSCULOSKELETAL:  No edema; No deformity  SKIN: Warm and dry NEUROLOGIC:  Alert and oriented x 3 PSYCHIATRIC:  Normal affect   ASSESSMENT:    1. Preop examination   2. Coronary artery disease involving native coronary artery of native heart without angina pectoris   3. Essential hypertension   4. Hyperlipidemia LDL goal <70   5. Hypothyroidism, unspecified type   6. Syncope and collapse   7. Multiple myeloma, remission status unspecified (HCC)    PLAN:    In order of problems listed above:  Preoperative clearance: Patient is having second thoughts.  Given his advanced age, past cardiac history and a recent diagnosis of myeloma, he would be at least a moderate risk patient for the intended surgery.  His hernia is no longer bothering him and he does not wish to proceed with surgery at this  time.  He also has chronic vague chest discomfort.  If later he change his mind and wish to proceed with surgery, I think he would benefit from a nuclear stress test for preoperative evaluation.  CAD: Patient has chronic vague chest discomfort, frequency has not increased recently.  Continue on aspirin, Plavix and Lipitor  Hypertension: Blood pressure well controlled on current  therapy  Hyperlipidemia: On Lipitor.  Hypothyroidism: Managed by primary care provider  Syncope: Seen in the emergency room on 01/04/2021 for syncope.  Family felt patient was dehydrated.  He has not had any significant dizziness since.  We will check with MD, I did not order a heart monitor given lack of recurrence.  Plasma cell myeloma: Confirmed on recent biopsy.  Managed by oncology service.        Medication Adjustments/Labs and Tests Ordered: Current medicines are reviewed at length with the patient today.  Concerns regarding medicines are outlined above.  Orders Placed This Encounter  Procedures   EKG 12-Lead   No orders of the defined types were placed in this encounter.   Patient Instructions  Medication Instructions:  Your physician recommends that you continue on your current medications as directed. Please refer to the Current Medication list given to you today.  *If you need a refill on your cardiac medications before your next appointment, please call your pharmacy*  Lab Work: NONE ordered at this time of appointment   If you have labs (blood work) drawn today and your tests are completely normal, you will receive your results only by: [place] (if you have MyChart) OR A paper copy in the mail If you have any lab test that is abnormal or we need to change your treatment, we will call you to review the results.  Testing/Procedures: NONE ordered at this time of appointment   Follow-Up: At [facility], you and your health needs are our priority.  As part of our continuing mission to provide you with exceptional heart care, we have created designated Provider Care Teams.  These Care Teams include your primary Cardiologist (physician) and Advanced Practice Providers (APPs -  Physician Assistants and Nurse Practitioners) who all work together to provide you with the care you need, when you need it.  Your next appointment:   Follow up as scheduled  -03/28/21  The format for your next appointment:   In Person  Provider:   Shelva Majestic, MD  Other Instructions When at home and sitting for periods of time, place a chair under your legs to elevate your legs to assist with swelling    Signed, [name], [place]  01/19/2021 12:02 AM    [place]

## 2021-01-16 NOTE — Telephone Encounter (Signed)
New prescription for Revlimid '5mg'$  14 days on, 7 days off faxed to [facility] at [phone] per Dr. Delton Coombes verbal order.

## 2021-01-16 NOTE — Telephone Encounter (Signed)
Oral Oncology Pharmacist Encounter  Received new prescription for Revlimid (lenalidomide) for the treatment of IgG lambda multiple myeloma, planned duration until disease progression or unacceptable drug toxicity. Dexamethasone is on hold by the MD due to leg swelling.   BMP from 01/04/21 assessed, no relevant lab abnormalities. Prescription dose and frequency assessed. MD starting patient on a reduced dose due to age.  Current medication list in Epic reviewed, no DDIs with lenalidomide identified.  Evaluated chart and no patient barriers to medication adherence identified.   Prescription has been e-scribed to the [facility] for benefits analysis and approval.  Oral Oncology Clinic will continue to follow for insurance authorization, copayment issues, initial counseling and start date.   Darl Pikes, PharmD, BCPS, BCOP, CPP Hematology/Oncology Clinical Pharmacist Practitioner [facility] [place] [facility] [phone]  01/16/2021 1:34 PM

## 2021-01-16 NOTE — Telephone Encounter (Signed)
Oral Oncology Patient Advocate Encounter  Prior Authorization for Revlimid has been approved.    PA# [id number] Effective dates: 01/16/21 through 05/05/21  Patient will use [facility] to fill Revlimid.  Oral Oncology Clinic will continue to follow.   [place] Patient [place] Phone [phone] Fax [phone] 01/16/2021 10:02 AM

## 2021-01-16 NOTE — Patient Instructions (Addendum)
Medication Instructions:  Your physician recommends that you continue on your current medications as directed. Please refer to the Current Medication list given to you today.  *If you need a refill on your cardiac medications before your next appointment, please call your pharmacy*  Lab Work: NONE ordered at this time of appointment   If you have labs (blood work) drawn today and your tests are completely normal, you will receive your results only by: [place] (if you have MyChart) OR A paper copy in the mail If you have any lab test that is abnormal or we need to change your treatment, we will call you to review the results.  Testing/Procedures: NONE ordered at this time of appointment   Follow-Up: At [facility], you and your health needs are our priority.  As part of our continuing mission to provide you with exceptional heart care, we have created designated Provider Care Teams.  These Care Teams include your primary Cardiologist (physician) and Advanced Practice Providers (APPs -  Physician Assistants and Nurse Practitioners) who all work together to provide you with the care you need, when you need it.  Your next appointment:   Follow up as scheduled -03/28/21  The format for your next appointment:   In Person  Provider:   Shelva Majestic, MD  Other Instructions When at home and sitting for periods of time, place a chair under your legs to elevate your legs to assist with swelling

## 2021-01-16 NOTE — Progress Notes (Signed)
Order placed for beta 2 mircoglobulin to be drawn at next visit per Dr. Delton Coombes

## 2021-01-16 NOTE — Telephone Encounter (Signed)
Oral Oncology Patient Advocate Encounter   Received notification from [employer] that prior authorization for Revlimid is required.  (DOB for [employer] is [birth year])   PA submitted on CoverMyMeds Key [id number] Status is pending   Oral Oncology Clinic will continue to follow.  [place] [facility] [place] Phone [phone] Fax [phone] 01/16/2021 9:40 AM

## 2021-01-23 ENCOUNTER — Telehealth (HOSPITAL_COMMUNITY): Payer: Self-pay | Admitting: Pharmacy Technician

## 2021-01-23 NOTE — Telephone Encounter (Signed)
Oral Oncology Patient Advocate Encounter  Was successful in securing patient a $12,000 grant from [employer] to provide copayment coverage for Revlimid.  This will keep the out of pocket expense at $0.     [employer] ID: [id number]  I have spoken with the patient.   The billing information is as follows and has been shared with [employer].    RxBin: [id number] PCN: [id number] Member ID: [id number] Group ID: [id number] Dates of Eligibility: 12/19/20 through 12/18/21  Fund:  [place] Patient [place] Phone [phone] Fax [phone] 01/23/2021 1:11 PM

## 2021-01-24 DIAGNOSIS — Z08 Encounter for follow-up examination after completed treatment for malignant neoplasm: Secondary | ICD-10-CM | POA: Diagnosis not present

## 2021-01-24 DIAGNOSIS — L570 Actinic keratosis: Secondary | ICD-10-CM | POA: Diagnosis not present

## 2021-01-24 DIAGNOSIS — Z1283 Encounter for screening for malignant neoplasm of skin: Secondary | ICD-10-CM | POA: Diagnosis not present

## 2021-01-24 DIAGNOSIS — D225 Melanocytic nevi of trunk: Secondary | ICD-10-CM | POA: Diagnosis not present

## 2021-01-24 DIAGNOSIS — Z85820 Personal history of malignant melanoma of skin: Secondary | ICD-10-CM | POA: Diagnosis not present

## 2021-01-24 DIAGNOSIS — X32XXXD Exposure to sunlight, subsequent encounter: Secondary | ICD-10-CM | POA: Diagnosis not present

## 2021-02-01 ENCOUNTER — Other Ambulatory Visit (HOSPITAL_COMMUNITY): Payer: Self-pay

## 2021-02-01 DIAGNOSIS — L03119 Cellulitis of unspecified part of limb: Secondary | ICD-10-CM | POA: Diagnosis not present

## 2021-02-01 DIAGNOSIS — I10 Essential (primary) hypertension: Secondary | ICD-10-CM | POA: Diagnosis not present

## 2021-02-01 DIAGNOSIS — E063 Autoimmune thyroiditis: Secondary | ICD-10-CM | POA: Diagnosis not present

## 2021-02-01 MED ORDER — LENALIDOMIDE 5 MG PO CAPS: 5.0000 mg | ORAL_CAPSULE | Freq: Every day | ORAL | 0 refills | Status: DC

## 2021-02-01 NOTE — Telephone Encounter (Signed)
Chart reviewed. Revlimid refilled per last office note with Dr. Katragadda.  

## 2021-02-06 NOTE — Progress Notes (Signed)
[name], [place] [address]   CLINIC:  Medical Oncology/Hematology  PCP:  [facility], [place] / [place] [address]  [phone]  REASON FOR VISIT:  Follow-up for monoclonal gammopathy and anemia  PRIOR THERAPY: none  CURRENT THERAPY: surveillance  INTERVAL HISTORY:  Dale [name], a 87 y.o. male, returns for routine follow-up for his monoclonal gammopathy and anemia. Dale [name] was last seen on 01/15/2021.  Today he reports feeling well. He reports ankle swellings. He started Revlimid 09/17 and is due to restart 10/08. He reports that he is tolerating Revlimid well. He denies fatigue, diarrhea, and constipation. He denies black stools and hematochezia. He reports occasional rectal bleeding and he denies history of hemorrhoids. He reports he has difficulty swallowing cold liquids.   REVIEW OF SYSTEMS:  Review of Systems  Constitutional:  Negative for appetite change (80%) and fatigue (40%).  HENT:   Positive for trouble swallowing (cold liquids).   Respiratory:  Positive for chest tightness and shortness of breath.   Cardiovascular:  Positive for chest pain and leg swelling.  Gastrointestinal:  Negative for blood in stool, constipation and diarrhea.  Musculoskeletal:  Positive for myalgias (hands and legs cramping).  Neurological:  Positive for numbness (hands and legs).  All other systems reviewed and are negative.  PAST MEDICAL/SURGICAL HISTORY:  Past Medical History:  Diagnosis Date   Allergic rhinitis    Anal fissure    Anginal pain (HCC)    Asthma    Cancer (HCC)    skin   Chest pain    CHF (congestive heart failure) ([place])    Coronary heart disease    s/p stenting. cath in 01/2012 noncritical occlusion   Dysrhythmia    1st degree heart block   GERD (gastroesophageal reflux disease)    Glaucoma    Hiatal hernia    Hyperlipidemia    Hypertension    Hypothyroidism    Idiopathic thrombocytopenic  purpura ([place]) 2002   Macular degeneration    Nephrolithiasis    PUD (peptic ulcer disease)    remote   Sarcoidosis    pulmonary   Schatzki's ring    Past Surgical History:  Procedure Laterality Date   cardiac stents     COLONOSCOPY  10/30/2006   Normal rectum, sigmoid diverticula.Remainder of colonic mucosa appeared normal.   CORONARY ANGIOPLASTY WITH STENT PLACEMENT     about 10 years ago per pt (around 2007)   [place], URETEROSCOPY AND STENT PLACEMENT Left 06/16/2017   Procedure: CYSTOSCOPY WITH RETROGRADE PYELOGRAM, URETEROSCOPY,STONE EXTRACTION  AND STENT PLACEMENT;  Surgeon: Franchot Gallo, MD;  Location: [facility];  Service: Urology;  Laterality: Left;   ESOPHAGOGASTRODUODENOSCOPY  06/19/2004   Two esophageal rings and esophageal web as described above.  All of these were disrupted by passing 56-French Venia Minks dilator/ Candida esophagitis,which appears to be incidental given history of   antibiotic use, but nevertheless will be treated.   ESOPHAGOGASTRODUODENOSCOPY  10/30/2006   Distal tandem esophageal ring status post dilation disruption as  described above.  Otherwise normal esophagus/  Small hiatal hernia otherwise normal stomach, D1 and D2   ESOPHAGOGASTRODUODENOSCOPY N/A 03/22/2015   Dr.Rourk- noncritical schatzki's ring and hiatal hernia-o/w normal EGD.    ESOPHAGOGASTRODUODENOSCOPY (EGD) WITH ESOPHAGEAL DILATION  03/04/2012   RMR- schatzki's ring, hiatal hernia, polypoid gastric mucosa, bx= minimally active gastritis.   HOLMIUM LASER APPLICATION Left 12/30/784   Procedure: HOLMIUM LASER APPLICATION;  Surgeon: Franchot Gallo, MD;  Location: [facility];  Service: Urology;  Laterality: Left;   IR GENERIC HISTORICAL  03/06/2016   IR RADIOLOGIST EVAL & MGMT 03/06/2016 Aletta Edouard, MD [facility] INTERV RAD   IR GENERIC HISTORICAL  06/18/2016   IR RADIOLOGIST EVAL & MGMT 06/18/2016 Aletta Edouard, MD [facility] INTERV RAD   IR RADIOLOGIST EVAL & MGMT   10/01/2016   IR RADIOLOGIST EVAL & MGMT  10/15/2017   IR RADIOLOGIST EVAL & MGMT  12/24/2018   IR RADIOLOGIST EVAL & MGMT  01/04/2020   LEFT HEART CATH N/A 02/02/2012   Procedure: LEFT HEART CATH;  Surgeon: Lorretta Harp, MD;  Location: [facility] CATH LAB;  Service: Cardiovascular;  Laterality: N/A;   MEDIASTINOSCOPY     for dx sarcoid   RADIOLOGY WITH ANESTHESIA Left 05/17/2016   Procedure: left renal ablation;  Surgeon: Aletta Edouard, MD;  Location: [facility];  Service: Radiology;  Laterality: Left;    SOCIAL HISTORY:  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    Employer: RETIRED  Tobacco Use   Smoking status: Former    Packs/day: 0.10    Years: 2.00    Pack years: 0.20    Types: Cigarettes, Cigars    Quit date: 05/06/1970    Years since quitting: 50.7   Smokeless tobacco: Never  Vaping Use   Vaping Use: Never used  Substance and Sexual Activity   Alcohol use: No    Alcohol/week: 0.0 standard drinks   Drug use: No   Sexual activity: Never  Other Topics Concern   Not on file  Social History Narrative   Not on file   Social Determinants of Health   Financial Resource Strain: Not on file  Food Insecurity: Not on file  Transportation Needs: Not on file  Physical Activity: Not on file  Stress: Not on file  Social Connections: Not on file  Intimate Partner Violence: Not on file    FAMILY HISTORY:  Family History  Problem Relation Age of Onset   Heart disease Father        deceased age 23   Stroke Mother    Alzheimer's disease Mother    Heart attack Brother        deceased age 5   Cancer Other        niece   Colon cancer Neg Hx     CURRENT MEDICATIONS:  Current Outpatient Medications  Medication Sig Dispense Refill   aspirin 81 MG  tablet Take 81 mg by mouth every Monday, Wednesday, and Friday.     atorvastatin (LIPITOR) 40 MG tablet TAKE 1 TABLET BY MOUTH IN  THE EVENING (Patient taking differently: Take 40 mg by mouth every evening.) 90 tablet 3   atropine 1 % ophthalmic solution Place one drop into the right eye 2 (two) times daily for 14 days. Do not take tonight.  Dr. Ander Slade will evaluate your post-operative status tomorrow and may make changes to the duration and dose of this medication.     brimonidine (ALPHAGAN) 0.2 % ophthalmic solution Place 1 drop into both eyes 2 (two) times daily.     clopidogrel (PLAVIX) 75 MG tablet TAKE 1 TABLET BY MOUTH  DAILY (Patient taking differently: Take 75 mg by  mouth daily.) 90 tablet 1   dexamethasone (DECADRON) 2 MG tablet Take 5 tablets (10 mg total) by mouth once a week. 20 tablet 3   dorzolamide-timolol (COSOPT) 22.3-6.8 MG/ML ophthalmic solution Place 1 drop into both eyes 2 (two) times daily.     fluocinonide cream (LIDEX) 9.47 % Apply 1 application topically 2 (two) times daily.     furosemide (LASIX) 40 MG tablet Take 40 mg by mouth daily. Take 40 mg daily     isosorbide mononitrate (IMDUR) 120 MG 24 hr tablet Take 60 mg by mouth 2 (two) times daily.     lenalidomide (REVLIMID) 5 MG capsule Take 1 capsule (5 mg total) by mouth daily. 14 days on, 7 days off 14 capsule 0   levothyroxine (SYNTHROID) 125 MCG tablet Take 125 mcg by mouth daily before breakfast.     losartan (COZAAR) 100 MG tablet TAKE 1 TABLET BY MOUTH  DAILY 90 tablet 2   metoprolol tartrate (LOPRESSOR) 25 MG tablet TAKE 1 TABLET BY MOUTH IN  THE MORNING AND 1/2 TABLET  IN THE EVENING (Patient taking differently: Take 12.5-25 mg by mouth See admin instructions. 25 mg in the morning  12.5 mg in the evening) 135 tablet 3   mometasone (ELOCON) 0.1 % ointment Apply 1 application topically daily.      Multiple Vitamins-Minerals (PRESERVISION/LUTEIN) CAPS Take 1 capsule by mouth 2 (two) times daily.     pantoprazole  (PROTONIX) 40 MG tablet TAKE 1 TABLET BY MOUTH  DAILY BEFORE BREAKFAST (Patient taking differently: Take 40 mg by mouth daily.) 90 tablet 3   potassium chloride SA (KLOR-CON) 20 MEQ tablet Take 1 tablet (20 mEq total) by mouth daily. 30 tablet 0   prednisoLONE acetate (PRED FORTE) 1 % ophthalmic suspension 1 drop 4 (four) times daily. Rt eye 4 times daily     ROCKLATAN 0.02-0.005 % SOLN Apply to eye.     valACYclovir (VALTREX) 1000 MG tablet Take 1,000 mg by mouth 2 (two) times daily.     vitamin C (ASCORBIC ACID) 500 MG tablet Take 500 mg by mouth daily.     vitamin E 200 UNIT capsule Take 200 Units by mouth daily at 12 noon.      doxycycline (VIBRAMYCIN) 100 MG capsule Take by mouth 2 (two) times daily.     neomycin-polymyxin-dexameth (MAXITROL) 0.1 % OINT Do not use tonight!   Apply a strip of ointment the size of a grain of rice to RIGHT eye at bedtime. This must be the last eye medicine of the day.   Dr. Ander Slade will evaluate your post-operative status tomorrow and may make changes to the duration and dose of this medication.     nitroGLYCERIN (NITROSTAT) 0.4 MG SL tablet Place 1 tablet (0.4 mg total) under the tongue every 5 (five) minutes as needed. For chest pain (Patient not taking: Reported on 02/07/2021) 25 tablet 2   prochlorperazine (COMPAZINE) 10 MG tablet Take 1 tablet (10 mg total) by mouth every 6 (six) hours as needed for nausea or vomiting. (Patient not taking: Reported on 02/07/2021) 30 tablet 3   triamcinolone (KENALOG) 0.1 % cream Apply 1 application topically 2 (two) times daily as needed (for irritation). (Patient not taking: Reported on 02/07/2021)     No current facility-administered medications for this visit.    ALLERGIES:  Allergies  Allergen Reactions   Azithromycin Other (See Comments)    Sore mouth and fever blisters around mouth, sores in nose area as well   Doxazosin  Shortness Of Breath   Acetaminophen Other (See Comments)    REACTION: UNKNOWN REACTION    Atenolol Other (See Comments)    REACTION: UNKNOWN REACTION   Hydrocodone Nausea And Vomiting   Hydrocodone-Acetaminophen Nausea Only   Levofloxacin Other (See Comments)    Caused stomach problems.   Morphine Other (See Comments)    "made me crazy"   Penicillins Nausea And Vomiting and Other (See Comments)    Has patient had a PCN reaction causing immediate rash, facial/tongue/throat swelling, SOB or lightheadedness with hypotension: No Has patient had a PCN reaction causing severe rash involving mucus membranes or skin necrosis: No Has patient had a PCN reaction that required hospitalization No Has patient had a PCN reaction occurring within the last 10 years: No If all of the above answers are "NO", then may proceed with Cephalosporin use.    Sulfonamide Derivatives Nausea And Vomiting    PHYSICAL EXAM:  Performance status (ECOG): 0 - Asymptomatic  Vitals:   02/07/21 1141  BP: (!) 145/57  Pulse: 61  Resp: 18  Temp: 97.6 F (36.4 C)  SpO2: 100%   Wt Readings from Last 3 Encounters:  02/07/21 134 lb (60.8 kg)  01/16/21 128 lb (58.1 kg)  01/15/21 132 lb 3.2 oz (60 kg)   Physical Exam Vitals reviewed.  Constitutional:      Appearance: Normal appearance.  Cardiovascular:     Rate and Rhythm: Normal rate and regular rhythm.     Pulses: Normal pulses.     Heart sounds: Normal heart sounds.  Pulmonary:     Effort: Pulmonary effort is normal.     Breath sounds: Normal breath sounds.  Musculoskeletal:     Right lower leg: 3+ Edema present.     Left lower leg: No edema.  Neurological:     General: No focal deficit present.     Mental Status: He is alert and oriented to person, place, and time.  Psychiatric:        Mood and Affect: Mood normal.        Behavior: Behavior normal.    LABORATORY DATA:  I have reviewed the labs as listed.  CBC Latest Ref Rng & Units 02/07/2021 01/04/2021 12/18/2020  WBC 4.0 - 10.5 K/uL 4.3 10.8(H) 5.2  Hemoglobin 13.0 - 17.0 g/dL 7.7(L)  8.6(L) 8.9(L)  Hematocrit 39.0 - 52.0 % 24.2(L) 26.0(L) 27.0(L)  Platelets 150 - 400 K/uL 100(L) 149(L) 154   CMP Latest Ref Rng & Units 02/07/2021 01/04/2021 12/01/2020  Glucose 70 - 99 mg/dL 110(H) 112(H) 100(H)  BUN 8 - 23 mg/dL 22 21 25(H)  Creatinine 0.61 - 1.24 mg/dL 0.87 0.90 0.90  Sodium 135 - 145 mmol/L 134(L) 134(L) 134(L)  Potassium 3.5 - 5.1 mmol/L 3.7 4.0 3.8  Chloride 98 - 111 mmol/L 104 103 104  CO2 22 - 32 mmol/L '25 24 26  ' Calcium 8.9 - 10.3 mg/dL 8.7(L) 9.2 8.9  Total Protein 6.5 - 8.1 g/dL 7.7 8.6(H) 8.4(H)  Total Bilirubin 0.3 - 1.2 mg/dL 0.7 0.9 0.7  Alkaline Phos 38 - 126 U/L 122 69 75  AST 15 - 41 U/L '18 24 22  ' ALT 0 - 44 U/L '17 22 22      ' Component Value Date/Time   RBC 1.96 (L) 02/07/2021 1057   MCV 123.5 (H) 02/07/2021 1057   MCV 107 (H) 10/26/2019 1013   MCH 39.3 (H) 02/07/2021 1057   MCHC 31.8 02/07/2021 1057   RDW 15.9 (H) 02/07/2021 1057   RDW  12.7 10/26/2019 1013   LYMPHSABS PENDING 02/07/2021 [place] 09/08/2015 0933   MONOABS PENDING 02/07/2021 1057   EOSABS PENDING 02/07/2021 1057   EOSABS CANCELED 09/08/2015 0933   BASOSABS PENDING 02/07/2021 1057   BASOSABS CANCELED 09/08/2015 0933    DIAGNOSTIC IMAGING:  I have independently reviewed the scans and discussed with the patient. No results found.   ASSESSMENT:  1.  IgG lambda plasma cell myeloma: - Work-up for macrocytic anemia on 12/01/2020 showed M spike of 2.9 g.  Immunofixation IgG lambda. - Lambda light chains elevated at 284.  Light chain ratio was 0.04.  LDH was 163.  Creatinine was 0.9 and calcium 8.9. - Bone marrow biopsy on 12/18/2020-hypercellular marrow for age with 48% atypical plasma cells with lambda light chain restriction. - Chromosome analysis was normal. 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  2.  Macrocytic anemia: - CBC on 12/01/2020 with hemoglobin 8.5 and MCV of  117. - Denies any bleeding per rectum or melena.  3.  Social/family history: - Lives at home with his wife.  He does all ADLs and IADLs.  He even does yard work. - He worked at [employer] for 35 years.  Denies any chemical exposure.  Non-smoker. - No family history of malignancies.   PLAN:  1.  IgG lambda plasma cell myeloma, high risk: - Because of his age and comorbidities, 2 drug regimen would be more ideal. - Revlimid 5 mg 2 weeks on/1 week off started on 01/20/2021. - He did not experience any GI toxicities. - Reviewed labs today which showed normal LFTs with albumin 3.0.  Creatinine was normal.  CBC shows platelet count 100 and white count 4.3.  Hemoglobin was 7.7.  ANC was 1.1. - He will restart Revlimid at the same dose on 02/10/2021. - I have recommended adding dexamethasone 10 mg weekly to Revlimid. - RTC 3 weeks for follow-up with repeat labs.  2.  Macrocytic anemia: - S50, folic acid, methylmalonic acid and copper levels were normal. - Ferritin is 123 and percent saturation is 17. - Anemia likely from myeloma infiltration. - We will talk to him about parenteral iron therapy at next visit to see if it helps.  3.  Thromboprophylaxis: - Continue Plavix daily and aspirin 81 mg 3 times weekly.  4.  Right ankle swelling: - This has improved very slightly.  He did not have any redness.  Continue furosemide as needed. - He was reportedly started on doxycycline last Saturday twice daily.   Orders placed this encounter:  No orders of the defined types were placed in this encounter.    Derek Jack, MD [place] [phone]   I, Thana Ates, am acting as a scribe for Dr. Derek Jack.  I, Derek Jack MD, have reviewed the above documentation for accuracy and completeness, and I agree with the above.

## 2021-02-07 ENCOUNTER — Inpatient Hospital Stay (HOSPITAL_COMMUNITY): Payer: Medicare Other | Attending: Hematology | Admitting: Hematology

## 2021-02-07 ENCOUNTER — Inpatient Hospital Stay (HOSPITAL_COMMUNITY): Payer: Medicare Other

## 2021-02-07 ENCOUNTER — Other Ambulatory Visit: Payer: Self-pay

## 2021-02-07 VITALS — BP 145/57 | HR 61 | Temp 97.6°F | Resp 18 | Wt 134.0 lb

## 2021-02-07 DIAGNOSIS — D539 Nutritional anemia, unspecified: Secondary | ICD-10-CM | POA: Diagnosis not present

## 2021-02-07 DIAGNOSIS — C9000 Multiple myeloma not having achieved remission: Secondary | ICD-10-CM | POA: Diagnosis not present

## 2021-02-07 DIAGNOSIS — Z79899 Other long term (current) drug therapy: Secondary | ICD-10-CM | POA: Diagnosis not present

## 2021-02-07 DIAGNOSIS — Z87891 Personal history of nicotine dependence: Secondary | ICD-10-CM | POA: Diagnosis not present

## 2021-02-07 DIAGNOSIS — D649 Anemia, unspecified: Secondary | ICD-10-CM

## 2021-02-07 DIAGNOSIS — D696 Thrombocytopenia, unspecified: Secondary | ICD-10-CM | POA: Diagnosis not present

## 2021-02-07 LAB — COMPREHENSIVE METABOLIC PANEL
ALT: 17 U/L (ref 0–44)
AST: 18 U/L (ref 15–41)
Albumin: 3 g/dL — ABNORMAL LOW (ref 3.5–5.0)
Alkaline Phosphatase: 122 U/L (ref 38–126)
Anion gap: 5 (ref 5–15)
BUN: 22 mg/dL (ref 8–23)
CO2: 25 mmol/L (ref 22–32)
Calcium: 8.7 mg/dL — ABNORMAL LOW (ref 8.9–10.3)
Chloride: 104 mmol/L (ref 98–111)
Creatinine, Ser: 0.87 mg/dL (ref 0.61–1.24)
GFR, Estimated: >60 mL/min (ref >60)
Glucose, Bld: 110 mg/dL — ABNORMAL HIGH (ref 70–99)
Potassium: 3.7 mmol/L (ref 3.5–5.1)
Sodium: 134 mmol/L — ABNORMAL LOW (ref 135–145)
Total Bilirubin: 0.7 mg/dL (ref 0.3–1.2)
Total Protein: 7.7 g/dL (ref 6.5–8.1)

## 2021-02-07 LAB — CBC WITH DIFFERENTIAL/PLATELET
Basophils Absolute: 0 10*3/uL (ref 0.0–0.1)
Basophils Relative: 0 %
Eosinophils Absolute: 0.1 10*3/uL (ref 0.0–0.5)
Eosinophils Relative: 2 %
HCT: 24.2 % — ABNORMAL LOW (ref 39.0–52.0)
Hemoglobin: 7.7 g/dL — ABNORMAL LOW (ref 13.0–17.0)
Lymphocytes Relative: 59 %
Lymphs Abs: 2.5 10*3/uL (ref 0.7–4.0)
MCH: 39.3 pg — ABNORMAL HIGH (ref 26.0–34.0)
MCHC: 31.8 g/dL (ref 30.0–36.0)
MCV: 123.5 fL — ABNORMAL HIGH (ref 80.0–100.0)
Monocytes Absolute: 0.6 10*3/uL (ref 0.1–1.0)
Monocytes Relative: 14 %
Neutro Abs: 1.1 10*3/uL — ABNORMAL LOW (ref 1.7–7.7)
Neutrophils Relative %: 25 %
Platelets: 100 10*3/uL — ABNORMAL LOW (ref 150–400)
RBC: 1.96 MIL/uL — ABNORMAL LOW (ref 4.22–5.81)
RDW: 15.9 % — ABNORMAL HIGH (ref 11.5–15.5)
WBC: 4.3 10*3/uL (ref 4.0–10.5)
nRBC: 0 % (ref 0.0–0.2)

## 2021-02-07 LAB — IRON AND TIBC
Iron: 51 ug/dL (ref 45–182)
Saturation Ratios: 17 % — ABNORMAL LOW (ref 17.9–39.5)
TIBC: 298 ug/dL (ref 250–450)
UIBC: 247 ug/dL

## 2021-02-07 LAB — FERRITIN: Ferritin: 123 ng/mL (ref 24–336)

## 2021-02-07 LAB — LACTATE DEHYDROGENASE: LDH: 139 U/L (ref 98–192)

## 2021-02-07 LAB — MAGNESIUM: Magnesium: 2.1 mg/dL (ref 1.7–2.4)

## 2021-02-07 MED ORDER — DEXAMETHASONE 2 MG PO TABS: 10.0000 mg | ORAL_TABLET | ORAL | 3 refills | Status: DC

## 2021-02-07 NOTE — Progress Notes (Signed)
Patient is taking Revlimid as prescribed.  He has not missed any doses and reports no side effects at this time.   

## 2021-02-07 NOTE — Patient Instructions (Addendum)
[place] at [facility] Discharge Instructions  You were seen and examined by Dr. Delton Coombes today. Continue taking Revlimid as prescribed.  Start steroid (dexamethasone) weekly after you finish the doxycycline. Return as scheduled for lab work and office visit.     Thank you for choosing [place] at [facility] to provide your oncology and hematology care.  To afford each patient quality time with our provider, please arrive at least 15 minutes before your scheduled appointment time.   If you have a lab appointment with the [facility] please come in thru the Main Entrance and check in at the main information desk.  You need to re-schedule your appointment should you arrive 10 or more minutes late.  We strive to give you quality time with our providers, and arriving late affects you and other patients whose appointments are after yours.  Also, if you no show three or more times for appointments you may be dismissed from the clinic at the providers discretion.     Again, thank you for choosing [facility].  Our hope is that these requests will decrease the amount of time that you wait before being seen by our physicians.       _____________________________________________________________  Should you have questions after your visit to [facility], please contact our office at [phone] and follow the prompts.  Our office hours are 8:00 a.m. and 4:30 p.m. Monday - Friday.  Please note that voicemails left after 4:00 p.m. may not be returned until the following business day.  We are closed weekends and major holidays.  You do have access to a nurse 24-7, just call the main number to the clinic [phone] and do not press any options, hold on the line and a nurse will answer the phone.    For prescription refill requests, have your pharmacy contact our office and allow 72 hours.    Due to Covid, you will need to  wear a mask upon entering the hospital. If you do not have a mask, a mask will be given to you at the Main Entrance upon arrival. For doctor visits, patients may have 1 support person age 73 or older with them. For treatment visits, patients can not have anyone with them due to social distancing guidelines and our immunocompromised population.

## 2021-02-08 LAB — BETA 2 MICROGLOBULIN, SERUM: Beta-2 Microglobulin: 4 mg/L — ABNORMAL HIGH (ref 0.6–2.4)

## 2021-02-08 LAB — KAPPA/LAMBDA LIGHT CHAINS
Kappa free light chain: 16.2 mg/L (ref 3.3–19.4)
Kappa, lambda light chain ratio: 0.03 — ABNORMAL LOW (ref 0.26–1.65)
Lambda free light chains: 470.4 mg/L — ABNORMAL HIGH (ref 5.7–26.3)

## 2021-02-09 LAB — PROTEIN ELECTROPHORESIS, SERUM
A/G Ratio: 0.7 (ref 0.7–1.7)
Albumin ELP: 3.2 g/dL (ref 2.9–4.4)
Alpha-1-Globulin: 0.3 g/dL (ref 0.0–0.4)
Alpha-2-Globulin: 0.7 g/dL (ref 0.4–1.0)
Beta Globulin: 0.8 g/dL (ref 0.7–1.3)
Gamma Globulin: 2.6 g/dL — ABNORMAL HIGH (ref 0.4–1.8)
Globulin, Total: 4.4 g/dL — ABNORMAL HIGH (ref 2.2–3.9)
M-Spike, %: 2.3 g/dL — ABNORMAL HIGH
Total Protein ELP: 7.6 g/dL (ref 6.0–8.5)

## 2021-02-16 ENCOUNTER — Other Ambulatory Visit: Payer: Self-pay | Admitting: Cardiovascular Disease

## 2021-02-21 ENCOUNTER — Other Ambulatory Visit (HOSPITAL_COMMUNITY): Payer: Self-pay

## 2021-02-21 MED ORDER — LENALIDOMIDE 5 MG PO CAPS: 5.0000 mg | ORAL_CAPSULE | Freq: Every day | ORAL | 0 refills | Status: DC

## 2021-02-21 NOTE — Telephone Encounter (Signed)
Chart reviewed. Revlimid refilled per last office note with Dr. Katragadda.  

## 2021-02-28 NOTE — Progress Notes (Signed)
[address], [place] [address]   CLINIC:  Medical Oncology/Hematology  PCP:  [facility], [place] / [place] [address]  [phone]  REASON FOR VISIT:  Follow-up for multiple myeloma and anemia  PRIOR THERAPY: none  CURRENT THERAPY: surveillance  INTERVAL HISTORY:  [name], a 87 y.o. male, returns for routine follow-up for his multiple myeloma and anemia. Mical was last seen on 02/07/2021.  Today he reports feeling good. He is taking Revlimid and tolerating it well. He denies current bleeding, easy bruising, and diarrhea. He reports mild constipation, and he is not currently taking stool softener. He has gained 4 pounds since his last visit. He reports good appetite.   REVIEW OF SYSTEMS:  Review of Systems  Constitutional:  Negative for appetite change, fatigue (70%) and unexpected weight change (+ 4 lbs).  HENT:   Positive for trouble swallowing (chewing). Negative for nosebleeds.   Respiratory:  Negative for hemoptysis.   Gastrointestinal:  Positive for constipation. Negative for blood in stool and diarrhea.  Genitourinary:  Negative for hematuria.   Hematological:  Does not bruise/bleed easily.  All other systems reviewed and are negative.  PAST MEDICAL/SURGICAL HISTORY:  Past Medical History:  Diagnosis Date   Allergic rhinitis    Anal fissure    Anginal pain (HCC)    Asthma    Cancer (HCC)    skin   Chest pain    CHF (congestive heart failure) ([place])    Coronary heart disease    s/p stenting. cath in 01/2012 noncritical occlusion   Dysrhythmia    1st degree heart block   GERD (gastroesophageal reflux disease)    Glaucoma    Hiatal hernia    Hyperlipidemia    Hypertension    Hypothyroidism    Idiopathic thrombocytopenic purpura ([place]) 2002   Macular degeneration    Nephrolithiasis    PUD (peptic ulcer disease)    remote   Sarcoidosis    pulmonary   Schatzki's ring    Past Surgical  History:  Procedure Laterality Date   cardiac stents     COLONOSCOPY  10/30/2006   Normal rectum, sigmoid diverticula.Remainder of colonic mucosa appeared normal.   CORONARY ANGIOPLASTY WITH STENT PLACEMENT     about 10 years ago per pt (around 2007)   [place], URETEROSCOPY AND STENT PLACEMENT Left 06/16/2017   Procedure: CYSTOSCOPY WITH RETROGRADE PYELOGRAM, URETEROSCOPY,STONE EXTRACTION  AND STENT PLACEMENT;  Surgeon: Franchot Gallo, MD;  Location: [facility];  Service: Urology;  Laterality: Left;   ESOPHAGOGASTRODUODENOSCOPY  06/19/2004   Two esophageal rings and esophageal web as described above.  All of these were disrupted by passing 56-French Venia Minks dilator/ Candida esophagitis,which appears to be incidental given history of   antibiotic use, but nevertheless will be treated.   ESOPHAGOGASTRODUODENOSCOPY  10/30/2006   Distal tandem esophageal ring status post dilation disruption as  described above.  Otherwise normal esophagus/  Small hiatal hernia otherwise normal stomach, D1 and D2   ESOPHAGOGASTRODUODENOSCOPY N/A 03/22/2015   Dr.Rourk- noncritical schatzki's ring and hiatal hernia-o/w normal EGD.    ESOPHAGOGASTRODUODENOSCOPY (EGD) WITH ESOPHAGEAL DILATION  03/04/2012   RMR- schatzki's ring, hiatal hernia, polypoid gastric mucosa, bx= minimally active gastritis.   HOLMIUM LASER APPLICATION Left 7/63/9432   Procedure: HOLMIUM LASER APPLICATION;  Surgeon: Franchot Gallo, MD;  Location: [facility];  Service: Urology;  Laterality: Left;   IR GENERIC HISTORICAL  03/06/2016   IR RADIOLOGIST EVAL &  MGMT 03/06/2016 Aletta Edouard, MD [facility] INTERV RAD   IR GENERIC HISTORICAL  06/18/2016   IR RADIOLOGIST EVAL & MGMT 06/18/2016 Aletta Edouard, MD [facility] INTERV RAD   IR RADIOLOGIST EVAL & MGMT  10/01/2016   IR RADIOLOGIST EVAL & MGMT  10/15/2017   IR RADIOLOGIST EVAL & MGMT  12/24/2018   IR RADIOLOGIST EVAL & MGMT  01/04/2020   LEFT HEART CATH N/A 02/02/2012    Procedure: LEFT HEART CATH;  Surgeon: Lorretta Harp, MD;  Location: [facility] C[facility];  Service: Cardiovascular;  Laterality: N/A;   MEDIASTINOSCOPY     for dx sarcoid   RADIOLOGY WITH ANESTHESIA Left 05/17/2016   Procedure: left renal ablation;  Surgeon: Aletta Edouard, MD;  Location: [facility];  Service: Radiology;  Laterality: Left;    SOCIAL HISTORY:  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    Employer: RETIRED  Tobacco Use   Smoking status: Former    Packs/day: 0.10    Years: 2.00    Pack years: 0.20    Types: Cigarettes, Cigars    Quit date: 05/06/1970    Years since quitting: 50.8   Smokeless tobacco: Never  Vaping Use   Vaping Use: Never used  Substance and Sexual Activity   Alcohol use: No    Alcohol/week: 0.0 standard drinks   Drug use: No   Sexual activity: Never  Other Topics Concern   Not on file  Social History Narrative   Not on file   Social Determinants of Health   Financial Resource Strain: Not on file  Food Insecurity: Not on file  Transportation Needs: Not on file  Physical Activity: Not on file  Stress: Not on file  Social Connections: Not on file  Intimate Partner Violence: Not on file    FAMILY HISTORY:  Family History  Problem Relation Age of Onset   Heart disease Father        deceased age 17   Stroke Mother    Alzheimer's disease Mother    Heart attack Brother        deceased age 73   Cancer Other        niece   Colon cancer Neg Hx     CURRENT MEDICATIONS:  Current Outpatient Medications  Medication Sig Dispense Refill   aspirin 81 MG tablet Take 81 mg by mouth every Monday, Wednesday, and Friday.     atorvastatin (LIPITOR) 40 MG tablet TAKE 1 TABLET BY MOUTH IN  THE EVENING (Patient taking  differently: Take 40 mg by mouth every evening.) 90 tablet 3   atropine 1 % ophthalmic solution Place one drop into the right eye 2 (two) times daily for 14 days. Do not take tonight.  Dr. Ander Slade will evaluate your post-operative status tomorrow and may make changes to the duration and dose of this medication.     brimonidine (ALPHAGAN) 0.2 % ophthalmic solution Place 1 drop into both eyes 2 (two) times daily.     clopidogrel (PLAVIX) 75 MG tablet TAKE 1 TABLET BY MOUTH  DAILY 90 tablet 3   dexamethasone (DECADRON) 2 MG tablet Take 5 tablets (10 mg total) by mouth once a week. 20 tablet 3   dorzolamide-timolol (COSOPT) 22.3-6.8  MG/ML ophthalmic solution Place 1 drop into both eyes 2 (two) times daily.     doxycycline (VIBRAMYCIN) 100 MG capsule Take by mouth 2 (two) times daily.     fluocinonide cream (LIDEX) 4.09 % Apply 1 application topically 2 (two) times daily.     furosemide (LASIX) 40 MG tablet Take 40 mg by mouth daily. Take 40 mg daily     isosorbide mononitrate (IMDUR) 120 MG 24 hr tablet Take 60 mg by mouth 2 (two) times daily.     lenalidomide (REVLIMID) 5 MG capsule Take 1 capsule (5 mg total) by mouth daily. 14 days on, 7 days off 14 capsule 0   levothyroxine (SYNTHROID) 125 MCG tablet Take 125 mcg by mouth daily before breakfast.     losartan (COZAAR) 100 MG tablet TAKE 1 TABLET BY MOUTH  DAILY 90 tablet 2   metoprolol tartrate (LOPRESSOR) 25 MG tablet TAKE 1 TABLET BY MOUTH IN  THE MORNING AND 1/2 TABLET  IN THE EVENING (Patient taking differently: Take 12.5-25 mg by mouth See admin instructions. 25 mg in the morning  12.5 mg in the evening) 135 tablet 3   mometasone (ELOCON) 0.1 % ointment Apply 1 application topically daily.      Multiple Vitamins-Minerals (PRESERVISION/LUTEIN) CAPS Take 1 capsule by mouth 2 (two) times daily.     nitroGLYCERIN (NITROSTAT) 0.4 MG SL tablet Place 1 tablet (0.4 mg total) under the tongue every 5 (five) minutes as needed. For chest pain (Patient not  taking: Reported on 02/07/2021) 25 tablet 2   pantoprazole (PROTONIX) 40 MG tablet TAKE 1 TABLET BY MOUTH  DAILY BEFORE BREAKFAST (Patient taking differently: Take 40 mg by mouth daily.) 90 tablet 3   potassium chloride SA (KLOR-CON) 20 MEQ tablet Take 1 tablet (20 mEq total) by mouth daily. 30 tablet 0   prednisoLONE acetate (PRED FORTE) 1 % ophthalmic suspension 1 drop 4 (four) times daily. Rt eye 4 times daily     prochlorperazine (COMPAZINE) 10 MG tablet Take 1 tablet (10 mg total) by mouth every 6 (six) hours as needed for nausea or vomiting. (Patient not taking: Reported on 02/07/2021) 30 tablet 3   ROCKLATAN 0.02-0.005 % SOLN Apply to eye.     triamcinolone (KENALOG) 0.1 % cream Apply 1 application topically 2 (two) times daily as needed (for irritation). (Patient not taking: Reported on 02/07/2021)     valACYclovir (VALTREX) 1000 MG tablet Take 1,000 mg by mouth 2 (two) times daily.     vitamin C (ASCORBIC ACID) 500 MG tablet Take 500 mg by mouth daily.     vitamin E 200 UNIT capsule Take 200 Units by mouth daily at 12 noon.      No current facility-administered medications for this visit.    ALLERGIES:  Allergies  Allergen Reactions   Azithromycin Other (See Comments)    Sore mouth and fever blisters around mouth, sores in nose area as well   Doxazosin Shortness Of Breath   Acetaminophen Other (See Comments)    REACTION: UNKNOWN REACTION   Atenolol Other (See Comments)    REACTION: UNKNOWN REACTION   Hydrocodone Nausea And Vomiting   Hydrocodone-Acetaminophen Nausea Only   Levofloxacin Other (See Comments)    Caused stomach problems.   Morphine Other (See Comments)    "made me crazy"   Penicillins Nausea And Vomiting and Other (See Comments)    Has patient had a PCN reaction causing immediate rash, facial/tongue/throat swelling, SOB or lightheadedness with hypotension: No Has patient had  a PCN reaction causing severe rash involving mucus membranes or skin necrosis: No Has  patient had a PCN reaction that required hospitalization No Has patient had a PCN reaction occurring within the last 10 years: No If all of the above answers are "NO", then may proceed with Cephalosporin use.    Sulfonamide Derivatives Nausea And Vomiting    PHYSICAL EXAM:  Performance status (ECOG): 0 - Asymptomatic  There were no vitals filed for this visit. Wt Readings from Last 3 Encounters:  02/07/21 134 lb (60.8 kg)  01/16/21 128 lb (58.1 kg)  01/15/21 132 lb 3.2 oz (60 kg)   Physical Exam Vitals reviewed.  Constitutional:      Appearance: Normal appearance.  Cardiovascular:     Rate and Rhythm: Normal rate and regular rhythm.     Pulses: Normal pulses.     Heart sounds: Normal heart sounds.  Pulmonary:     Effort: Pulmonary effort is normal.     Breath sounds: Normal breath sounds.  Musculoskeletal:     Right lower leg: 2+ Edema present.     Left lower leg: 1+ Edema present.  Neurological:     General: No focal deficit present.     Mental Status: He is alert and oriented to person, place, and time.  Psychiatric:        Mood and Affect: Mood normal.        Behavior: Behavior normal.    LABORATORY DATA:  I have reviewed the labs as listed.  CBC Latest Ref Rng & Units 02/07/2021 01/04/2021 12/18/2020  WBC 4.0 - 10.5 K/uL 4.3 10.8(H) 5.2  Hemoglobin 13.0 - 17.0 g/dL 7.7(L) 8.6(L) 8.9(L)  Hematocrit 39.0 - 52.0 % 24.2(L) 26.0(L) 27.0(L)  Platelets 150 - 400 K/uL 100(L) 149(L) 154   CMP Latest Ref Rng & Units 02/07/2021 01/04/2021 12/01/2020  Glucose 70 - 99 mg/dL 110(H) 112(H) 100(H)  BUN 8 - 23 mg/dL 22 21 25(H)  Creatinine 0.61 - 1.24 mg/dL 0.87 0.90 0.90  Sodium 135 - 145 mmol/L 134(L) 134(L) 134(L)  Potassium 3.5 - 5.1 mmol/L 3.7 4.0 3.8  Chloride 98 - 111 mmol/L 104 103 104  CO2 22 - 32 mmol/L _0 Calcium 8.9 - 10.3 mg/dL 8.7(L) 9.2 8.9  Total Protein 6.5 - 8.1 g/dL 7.7 8.6(H) 8.4(H)  Total Bilirubin 0.3 - 1.2 mg/dL 0.7 0.9 0.7  Alkaline Phos 38 - 126 U/L  122 69 75  AST 15 - 41 U/L _1 ALT 0 - 44 U/L _2 Component Value Date/Time   RBC 1.96 (L) 02/07/2021 1057   MCV 123.5 (H) 02/07/2021 1057   MCV 107 (H) 10/26/2019 1013   MCH 39.3 (H) 02/07/2021 1057   MCHC 31.8 02/07/2021 1057   RDW 15.9 (H) 02/07/2021 1057   RDW 12.7 10/26/2019 1013   LYMPHSABS 2.5 02/07/2021 1057   LYMPHSABS CANCELED 09/08/2015 0933   MONOABS 0.6 02/07/2021 1057   EOSABS 0.1 02/07/2021 1057   EOSABS CANCELED 09/08/2015 0933   BASOSABS 0.0 02/07/2021 1057   BASOSABS CANCELED 09/08/2015 0933    DIAGNOSTIC IMAGING:  I have independently reviewed the scans and discussed with the patient. No results found.   ASSESSMENT:  1.  IgG lambda plasma cell myeloma: - Work-up for macrocytic anemia on 12/01/2020 showed M spike of 2.9 g.  Immunofixation IgG lambda. - Lambda light chains elevated at 284.  Light chain ratio was 0.04.  LDH was 163.  Creatinine  was 0.9 and calcium 8.9. - Bone marrow biopsy on 12/18/2020-hypercellular marrow for age with 48% atypical plasma cells with lambda light chain restriction. - Chromosome analysis was normal. 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 - Revlimid 5 mg, 2 weeks on/1 week off started on 01/20/2021.  Dexamethasone weekly 10 mg added on 02/07/2021.  2.  Macrocytic anemia: - CBC on 12/01/2020 with hemoglobin 8.5 and MCV of 117. - Denies any bleeding per rectum or melena.  3.  Social/family history: - Lives at home with his wife.  He does all ADLs and IADLs.  He even does yard work. - He worked at [employer] for 35 years.  Denies any chemical exposure.  Non-smoker. - No family history of malignancies.   PLAN:  1.  IgG lambda plasma cell myeloma, high risk: - He is tolerating Revlimid reasonably well. - He also did very well with weekly dexamethasone 10 mg. - Reviewed myeloma labs from 02/07/2021.  M spike is 2.3. - Lambda light  chains are 470 and ratio is 0.03. - Would hold off starting his next cycle of Revlimid as his platelets are low at 43.  White count is normal at 5.4. - We will check CBC next Thursday.  If the platelets are above 70 K, will restart Revlimid.  RTC 5 weeks with myeloma labs 1 week prior. - He may continue dexamethasone 10 mg weekly.  2.  Macrocytic anemia: - O03, folic acid, methylmalonic acid and copper levels were normal.  Ferritin was 123 and percent saturation 17.  He does not have any CKD. - Anemia secondary to infiltration of the bone marrow.  Hemoglobin today improved to 8.1 from 7.7 previously.  3.  Thromboprophylaxis: - Continue Plavix daily and aspirin 81 mg 3 times weekly.  4.  Right ankle swelling: - He has 2+ edema in the right leg and 1+ in the left leg. - Continue Lasix as needed.  Orders placed this encounter:  No orders of the defined types were placed in this encounter.    Derek Jack, MD [place] [phone]   I, Thana Ates, am acting as a scribe for Dr. Derek Jack.  I, Derek Jack MD, have reviewed the above documentation for accuracy and completeness, and I agree with the above.

## 2021-03-01 ENCOUNTER — Other Ambulatory Visit: Payer: Self-pay

## 2021-03-01 ENCOUNTER — Inpatient Hospital Stay (HOSPITAL_COMMUNITY): Payer: Medicare Other

## 2021-03-01 ENCOUNTER — Inpatient Hospital Stay (HOSPITAL_BASED_OUTPATIENT_CLINIC_OR_DEPARTMENT_OTHER): Payer: Medicare Other | Admitting: Hematology

## 2021-03-01 VITALS — BP 117/49 | HR 65 | Temp 97.5°F | Resp 18 | Wt 138.0 lb

## 2021-03-01 DIAGNOSIS — D649 Anemia, unspecified: Secondary | ICD-10-CM

## 2021-03-01 DIAGNOSIS — D696 Thrombocytopenia, unspecified: Secondary | ICD-10-CM

## 2021-03-01 DIAGNOSIS — Z79899 Other long term (current) drug therapy: Secondary | ICD-10-CM | POA: Diagnosis not present

## 2021-03-01 DIAGNOSIS — D539 Nutritional anemia, unspecified: Secondary | ICD-10-CM | POA: Diagnosis not present

## 2021-03-01 DIAGNOSIS — Z87891 Personal history of nicotine dependence: Secondary | ICD-10-CM | POA: Diagnosis not present

## 2021-03-01 DIAGNOSIS — C9000 Multiple myeloma not having achieved remission: Secondary | ICD-10-CM | POA: Diagnosis not present

## 2021-03-01 LAB — CBC WITH DIFFERENTIAL/PLATELET
Basophils Absolute: 0 10*3/uL (ref 0.0–0.1)
Basophils Relative: 0 %
Eosinophils Absolute: 0.1 10*3/uL (ref 0.0–0.5)
Eosinophils Relative: 1 %
HCT: 25.8 % — ABNORMAL LOW (ref 39.0–52.0)
Hemoglobin: 8.1 g/dL — ABNORMAL LOW (ref 13.0–17.0)
Lymphocytes Relative: 52 %
Lymphs Abs: 2.8 10*3/uL (ref 0.7–4.0)
MCH: 38.9 pg — ABNORMAL HIGH (ref 26.0–34.0)
MCHC: 31.4 g/dL (ref 30.0–36.0)
MCV: 124 fL — ABNORMAL HIGH (ref 80.0–100.0)
Monocytes Absolute: 0.6 10*3/uL (ref 0.1–1.0)
Monocytes Relative: 11 %
Neutro Abs: 1.9 10*3/uL (ref 1.7–7.7)
Neutrophils Relative %: 36 %
Platelets: 43 10*3/uL — ABNORMAL LOW (ref 150–400)
RBC: 2.08 MIL/uL — ABNORMAL LOW (ref 4.22–5.81)
RDW: 15.9 % — ABNORMAL HIGH (ref 11.5–15.5)
Smear Review: NORMAL
WBC: 5.4 10*3/uL (ref 4.0–10.5)
nRBC: 0 % (ref 0.0–0.2)

## 2021-03-01 LAB — COMPREHENSIVE METABOLIC PANEL
ALT: 23 U/L (ref 0–44)
AST: 19 U/L (ref 15–41)
Albumin: 3 g/dL — ABNORMAL LOW (ref 3.5–5.0)
Alkaline Phosphatase: 105 U/L (ref 38–126)
Anion gap: 4 — ABNORMAL LOW (ref 5–15)
BUN: 21 mg/dL (ref 8–23)
CO2: 26 mmol/L (ref 22–32)
Calcium: 8.7 mg/dL — ABNORMAL LOW (ref 8.9–10.3)
Chloride: 103 mmol/L (ref 98–111)
Creatinine, Ser: 0.76 mg/dL (ref 0.61–1.24)
GFR, Estimated: >60 mL/min (ref >60)
Glucose, Bld: 114 mg/dL — ABNORMAL HIGH (ref 70–99)
Potassium: 3.7 mmol/L (ref 3.5–5.1)
Sodium: 133 mmol/L — ABNORMAL LOW (ref 135–145)
Total Bilirubin: 0.8 mg/dL (ref 0.3–1.2)
Total Protein: 6.9 g/dL (ref 6.5–8.1)

## 2021-03-01 LAB — SAMPLE TO BLOOD BANK

## 2021-03-01 LAB — LACTATE DEHYDROGENASE: LDH: 120 U/L (ref 98–192)

## 2021-03-01 NOTE — Addendum Note (Signed)
Addended by: Joie Bimler on: 03/01/2021 04:08 PM   Modules accepted: Orders

## 2021-03-01 NOTE — Patient Instructions (Addendum)
[place] at [facility] Discharge Instructions   You were seen and examined today by Dr. Delton Coombes.  Hold Revlimid x 1 week.  Return next week for lab work only - we want to check your platelet count to see if it has improved.  We will call you and let you know when to restart your chemo pill.  When you resume your cancer pill, you will take it for 2 weeks, then take a 2 week break before restarting.  You can continue taking the steroid (5 pills once a week).   Return as scheduled in 5 weeks for lab work and office visit.     Thank you for choosing [place] at [facility] to provide your oncology and hematology care.  To afford each patient quality time with our provider, please arrive at least 15 minutes before your scheduled appointment time.   If you have a lab appointment with the [facility] please come in thru the Main Entrance and check in at the main information desk.  You need to re-schedule your appointment should you arrive 10 or more minutes late.  We strive to give you quality time with our providers, and arriving late affects you and other patients whose appointments are after yours.  Also, if you no show three or more times for appointments you may be dismissed from the clinic at the providers discretion.     Again, thank you for choosing [facility].  Our hope is that these requests will decrease the amount of time that you wait before being seen by our physicians.       _____________________________________________________________  Should you have questions after your visit to [facility], please contact our office at [phone] and follow the prompts.  Our office hours are 8:00 a.m. and 4:30 p.m. Monday - Friday.  Please note that voicemails left after 4:00 p.m. may not be returned until the following business day.  We are closed weekends and major holidays.  You do have access to a nurse 24-7,  just call the main number to the clinic [phone] and do not press any options, hold on the line and a nurse will answer the phone.    For prescription refill requests, have your pharmacy contact our office and allow 72 hours.    Due to Covid, you will need to wear a mask upon entering the hospital. If you do not have a mask, a mask will be given to you at the Main Entrance upon arrival. For doctor visits, patients may have 1 support person age 63 or older with them. For treatment visits, patients can not have anyone with them due to social distancing guidelines and our immunocompromised population.

## 2021-03-08 ENCOUNTER — Inpatient Hospital Stay (HOSPITAL_COMMUNITY): Payer: Medicare Other | Attending: Hematology

## 2021-03-08 ENCOUNTER — Other Ambulatory Visit: Payer: Self-pay

## 2021-03-08 DIAGNOSIS — C9000 Multiple myeloma not having achieved remission: Secondary | ICD-10-CM | POA: Insufficient documentation

## 2021-03-08 DIAGNOSIS — D649 Anemia, unspecified: Secondary | ICD-10-CM

## 2021-03-08 DIAGNOSIS — D696 Thrombocytopenia, unspecified: Secondary | ICD-10-CM

## 2021-03-08 LAB — LACTATE DEHYDROGENASE: LDH: 109 U/L (ref 98–192)

## 2021-03-08 LAB — CBC WITH DIFFERENTIAL/PLATELET
Abs Immature Granulocytes: 0.03 10*3/uL (ref 0.00–0.07)
Basophils Absolute: 0 10*3/uL (ref 0.0–0.1)
Basophils Relative: 1 %
Eosinophils Absolute: 0.1 10*3/uL (ref 0.0–0.5)
Eosinophils Relative: 3 %
HCT: 26.6 % — ABNORMAL LOW (ref 39.0–52.0)
Hemoglobin: 8.3 g/dL — ABNORMAL LOW (ref 13.0–17.0)
Immature Granulocytes: 1 %
Lymphocytes Relative: 52 %
Lymphs Abs: 2 10*3/uL (ref 0.7–4.0)
MCH: 37.9 pg — ABNORMAL HIGH (ref 26.0–34.0)
MCHC: 31.2 g/dL (ref 30.0–36.0)
MCV: 121.5 fL — ABNORMAL HIGH (ref 80.0–100.0)
Monocytes Absolute: 0.9 10*3/uL (ref 0.1–1.0)
Monocytes Relative: 25 %
Neutro Abs: 0.7 10*3/uL — ABNORMAL LOW (ref 1.7–7.7)
Neutrophils Relative %: 18 %
Platelets: 76 10*3/uL — ABNORMAL LOW (ref 150–400)
RBC: 2.19 MIL/uL — ABNORMAL LOW (ref 4.22–5.81)
RDW: 15.2 % (ref 11.5–15.5)
WBC: 3.8 10*3/uL — ABNORMAL LOW (ref 4.0–10.5)
nRBC: 0 % (ref 0.0–0.2)

## 2021-03-08 LAB — COMPREHENSIVE METABOLIC PANEL
ALT: 25 U/L (ref 0–44)
AST: 21 U/L (ref 15–41)
Albumin: 2.9 g/dL — ABNORMAL LOW (ref 3.5–5.0)
Alkaline Phosphatase: 82 U/L (ref 38–126)
Anion gap: 4 — ABNORMAL LOW (ref 5–15)
BUN: 20 mg/dL (ref 8–23)
CO2: 26 mmol/L (ref 22–32)
Calcium: 8.9 mg/dL (ref 8.9–10.3)
Chloride: 105 mmol/L (ref 98–111)
Creatinine, Ser: 0.74 mg/dL (ref 0.61–1.24)
GFR, Estimated: >60 mL/min (ref >60)
Glucose, Bld: 102 mg/dL — ABNORMAL HIGH (ref 70–99)
Potassium: 3.6 mmol/L (ref 3.5–5.1)
Sodium: 135 mmol/L (ref 135–145)
Total Bilirubin: 0.4 mg/dL (ref 0.3–1.2)
Total Protein: 7.3 g/dL (ref 6.5–8.1)

## 2021-03-08 LAB — SAMPLE TO BLOOD BANK

## 2021-03-09 ENCOUNTER — Encounter (HOSPITAL_COMMUNITY): Payer: Self-pay

## 2021-03-09 ENCOUNTER — Other Ambulatory Visit (HOSPITAL_COMMUNITY): Payer: Self-pay

## 2021-03-09 DIAGNOSIS — C9000 Multiple myeloma not having achieved remission: Secondary | ICD-10-CM

## 2021-03-09 DIAGNOSIS — D649 Anemia, unspecified: Secondary | ICD-10-CM

## 2021-03-09 DIAGNOSIS — D696 Thrombocytopenia, unspecified: Secondary | ICD-10-CM

## 2021-03-09 LAB — KAPPA/LAMBDA LIGHT CHAINS
Kappa free light chain: 11.4 mg/L (ref 3.3–19.4)
Kappa, lambda light chain ratio: 0.02 — ABNORMAL LOW (ref 0.26–1.65)
Lambda free light chains: 502.6 mg/L — ABNORMAL HIGH (ref 5.7–26.3)

## 2021-03-09 NOTE — Progress Notes (Signed)
Patient and son called with recent lab results. Patient instructed to remain off of Revlimid until otherwise instructed per Dr. Delton Coombes. Patient scheduled for labs on 03/15/21 at 1010 for CBCd.

## 2021-03-12 LAB — PROTEIN ELECTROPHORESIS, SERUM
A/G Ratio: 0.8 (ref 0.7–1.7)
Albumin ELP: 3.1 g/dL (ref 2.9–4.4)
Alpha-1-Globulin: 0.3 g/dL (ref 0.0–0.4)
Alpha-2-Globulin: 0.7 g/dL (ref 0.4–1.0)
Beta Globulin: 0.7 g/dL (ref 0.7–1.3)
Gamma Globulin: 2.2 g/dL — ABNORMAL HIGH (ref 0.4–1.8)
Globulin, Total: 3.8 g/dL (ref 2.2–3.9)
M-Spike, %: 2 g/dL — ABNORMAL HIGH
Total Protein ELP: 6.9 g/dL (ref 6.0–8.5)

## 2021-03-13 ENCOUNTER — Other Ambulatory Visit (HOSPITAL_COMMUNITY): Payer: Self-pay

## 2021-03-13 DIAGNOSIS — Z23 Encounter for immunization: Secondary | ICD-10-CM | POA: Diagnosis not present

## 2021-03-15 ENCOUNTER — Telehealth (HOSPITAL_COMMUNITY): Payer: Self-pay | Admitting: *Deleted

## 2021-03-15 ENCOUNTER — Other Ambulatory Visit: Payer: Self-pay

## 2021-03-15 ENCOUNTER — Inpatient Hospital Stay (HOSPITAL_COMMUNITY): Payer: Medicare Other

## 2021-03-15 ENCOUNTER — Other Ambulatory Visit (HOSPITAL_COMMUNITY): Payer: Self-pay | Admitting: Hematology

## 2021-03-15 DIAGNOSIS — C9000 Multiple myeloma not having achieved remission: Secondary | ICD-10-CM | POA: Diagnosis not present

## 2021-03-15 DIAGNOSIS — D696 Thrombocytopenia, unspecified: Secondary | ICD-10-CM

## 2021-03-15 DIAGNOSIS — D649 Anemia, unspecified: Secondary | ICD-10-CM

## 2021-03-15 LAB — CBC WITH DIFFERENTIAL/PLATELET
Abs Immature Granulocytes: 0.03 10*3/uL (ref 0.00–0.07)
Basophils Absolute: 0 10*3/uL (ref 0.0–0.1)
Basophils Relative: 1 %
Eosinophils Absolute: 0.1 10*3/uL (ref 0.0–0.5)
Eosinophils Relative: 3 %
HCT: 24.3 % — ABNORMAL LOW (ref 39.0–52.0)
Hemoglobin: 7.8 g/dL — ABNORMAL LOW (ref 13.0–17.0)
Immature Granulocytes: 1 %
Lymphocytes Relative: 44 %
Lymphs Abs: 2 10*3/uL (ref 0.7–4.0)
MCH: 38.4 pg — ABNORMAL HIGH (ref 26.0–34.0)
MCHC: 32.1 g/dL (ref 30.0–36.0)
MCV: 119.7 fL — ABNORMAL HIGH (ref 80.0–100.0)
Monocytes Absolute: 1.3 10*3/uL — ABNORMAL HIGH (ref 0.1–1.0)
Monocytes Relative: 28 %
Neutro Abs: 1.1 10*3/uL — ABNORMAL LOW (ref 1.7–7.7)
Neutrophils Relative %: 23 %
Platelets: 112 10*3/uL — ABNORMAL LOW (ref 150–400)
RBC: 2.03 MIL/uL — ABNORMAL LOW (ref 4.22–5.81)
RDW: 15 % (ref 11.5–15.5)
WBC: 4.6 10*3/uL (ref 4.0–10.5)
nRBC: 0 % (ref 0.0–0.2)

## 2021-03-15 NOTE — Telephone Encounter (Signed)
Lab results from today reviewed with Dr. Delton Coombes.  Hgb 7.8, plts 112, ANC 1.1.  Okay to restart Revlimid 5 mg daily 14 days on 7 days off per M.D.  I spoke with pt's spouse and instructed her of the above.  She verbalizes understanding.  Pt's next office visit is 04/09/2021 with lab work prior on 04/02/2021.  Pt would be due to restart Revlimid on 04/05/2021.  I informed pt's spouse that I would monitor lab results from 11/28 and give them a call if for some reason he should not restart Revlimid on that day.  She verbalizes understanding and is agreeable to this plan.

## 2021-03-16 ENCOUNTER — Other Ambulatory Visit (HOSPITAL_COMMUNITY): Payer: Self-pay

## 2021-03-16 MED ORDER — LENALIDOMIDE 5 MG PO CAPS: 5.0000 mg | ORAL_CAPSULE | Freq: Every day | ORAL | 0 refills | Status: DC

## 2021-03-16 NOTE — Telephone Encounter (Signed)
Chart reviewed. Revlimid refilled per verbal order from Dr. Katragadda. 

## 2021-03-28 ENCOUNTER — Encounter: Payer: Self-pay | Admitting: Cardiovascular Disease

## 2021-03-28 ENCOUNTER — Other Ambulatory Visit: Payer: Self-pay

## 2021-03-28 ENCOUNTER — Ambulatory Visit (INDEPENDENT_AMBULATORY_CARE_PROVIDER_SITE_OTHER): Payer: Medicare Other | Admitting: Cardiovascular Disease

## 2021-03-28 DIAGNOSIS — R011 Cardiac murmur, unspecified: Secondary | ICD-10-CM | POA: Diagnosis not present

## 2021-03-28 DIAGNOSIS — E785 Hyperlipidemia, unspecified: Secondary | ICD-10-CM

## 2021-03-28 DIAGNOSIS — I25119 Atherosclerotic heart disease of native coronary artery with unspecified angina pectoris: Secondary | ICD-10-CM | POA: Diagnosis not present

## 2021-03-28 DIAGNOSIS — C9000 Multiple myeloma not having achieved remission: Secondary | ICD-10-CM | POA: Diagnosis not present

## 2021-03-28 DIAGNOSIS — M25473 Effusion, unspecified ankle: Secondary | ICD-10-CM

## 2021-03-28 DIAGNOSIS — I2510 Atherosclerotic heart disease of native coronary artery without angina pectoris: Secondary | ICD-10-CM

## 2021-03-28 DIAGNOSIS — I10 Essential (primary) hypertension: Secondary | ICD-10-CM | POA: Diagnosis not present

## 2021-03-28 MED ORDER — ISOSORBIDE MONONITRATE ER 60 MG PO TB24: ORAL_TABLET | ORAL | 2 refills | Status: DC

## 2021-03-28 MED ORDER — FUROSEMIDE 20 MG PO TABS: ORAL_TABLET | ORAL | 3 refills | Status: DC

## 2021-03-28 NOTE — Progress Notes (Signed)
Patient ID: Dale [name], male   DOB: 12/12/1928, 87 y.o.   MRN: [id number]    Primary M.D.: Dr. Redmond [name]  HPI: Dale [name] is a 87 y.o. male who presents to the office for a 6 month cardiology evaluation.  Dale [name] has known CAD and underwent initial intervention to his RCA in 2000. In 2009 a Cypher stent was placed beyond the acute margin of his RCA. His last cardiac catheterization was in September 2013 by Dr. Gwenlyn [name] which showed 30% LAD narrowing after the second diagonal vessel, and normal left circumflex coronary artery, and patent RCA stents.  He has a history of hypothyroidism on Synthroid replacement and has a history of hyperlipidemia. He had been on on Crestor 5 mg plus Zetia 10 mg; however, due to recent increased cost of Zetia, this was discontinued and he has been on Crestor 20 mg daily.  He also has a history of GERD treated with Protonix, and he continues to be on dual antiplatelet therapy. He is responsive to Plavix on previous P2 Y12 testing.  When I saw him in 2016 he had experienced 3-4 episodes of chest pain over the six-month period.  Most of these episodes have occurred at night and were nitrate responsive.  He states that they were similar to his previous discomfort.  When I saw him, I further titrated his isosorbide mononitrate to 60 mg in the morning and 30 mg at night.   He saw Dale [name], Dale [name] in August 2018 with weight loss, weakness, and vague chest pain.  He felt his chest pain was atypical and not ischemic.  An echo Doppler study on 12/23/2016 showed an EF of 55-60%.  He was hospitalized on August 29 through January 03, 2017 at  [name].  Apparently he had a nuclear stress test which did not reveal any reversible ischemia.  He was also felt to have acute sinusitis for which he was treated with antibiotics.  He has a history of renal cell CA.Marland Kitchen     When I saw him in October 2018, his chest pain had resolved. He underwent a carotid duplex study in October 2018  which showed mild plaque bilaterally at 1-39%. He denies increasing episodes of chest pain.  At times he notes rare chest discomfort which is short-lived.  He had developed some gross hematuria and was [name] to have a kidney stone which he underwent cystoscopy, left ureteral pyelogram with fluoroscopic interpretation, left ureteroscopy, holmium laser lithotripsy and extraction of left mid ureteral stone, placement of 24 x 6 French contour double-J stent, without tether by Dr Dale [name] on 06/16/2017.  I saw him in March 2019.  Since that time, he has been seen by Dale [name], Dale [name] on 2 occasions in October.  He was seen initially on February 03, 2018 for bilateral ankle edema at which time his hydrochlorothiazide was switched to Lasix.  When seen in follow-up he continued to have ankle edema and Lasix was further titrated to 40 mg and potassium supplementation was increased.  I saw him in January 2020.  Several weeks prior to that evaluation he had experienced an episode of sharp chest ache which was nonexertional.    He denied recurrent anginal type symptoms.  He was sleepy during the day but admits that he is sleeping well at night.  He typically goes to bed at 11 PM and wakes up at 7 AM.    When I saw him in May 2020, he had noticed progressive lower extremity  leg edema for several weeks previously.  He apparently was not taking his Lasix in the morning but had been taking this at 5 PM.  He denied chest pain, awareness of abnormal heart rhythm, PND orthopnea.  During that evaluation, I suggested that he take his Lasix 40 mg in the morning and for the next several days take 20 mg in the later afternoon and if edema persisted to continue to take the Lasix perhaps every other day in the afternoon as well.  He states typically he does not take his Lasix in the morning but typically takes his 40 mg at lunchtime.  He then waits till suppertime around 7 PM to take an additional 40 mg of Lasix which he has been doing on  most days.  He urinates until 10 PM.  He has not been taking the Lasix as soon as he wakes up because then he states he cannot do anything without only he continues to experience ankle swelling bilaterally left greater than right.  He has not been using support stockings.  At times he may notice a mild arrhythmia.  He denied presyncope or syncope or recurrent anginal symptoms.  During that evaluation, I recommended that he continue to use support stockings.  Due to his frequent PVCs noted on ECG I slightly titrated his metoprolol from 12.5 mg twice a day to 25 mg in the morning and 12.5 mg at night.    When I saw him in November 2020 he felt improved from his July 2020 evaluation  with titration of beta-blocker therapy and noticed resolution of his previous sense of palpitations.  His peripheral edema has improved with reinstitution of support stockings.  He denied any recurrent anginal symptoms.  He denied any significant shortness of breath.  I saw him in June 2021 at which time he admitted to vague chest pain which has been nitrate responsive.  His peripheral edema had improved with support stockings.  At that tim he denied any presyncope or syncope.  He has continued to be on DAPT with aspirin/Plavix.  He is on furosemide 40 mg, losartan 100 mg and metoprolol tartrate 25 mg in the morning and 12.5 mg in the evening.  He has been taking isosorbide 60 mg in the morning and 30 mg at night.  During that evaluation I recommended slight additional titration of isosorbide to 90 mg in the morning and 30 mg at night.  Apparently in September 2021 he developed a syncopal spell he was sitting in the chair.  His wife gave him a dose of nitroglycerin and he subsequently passed out.  Pressure on EMS arrival was 89/65, CBG 112.  He was given 40 of cc of IV fluid prior to ER arrival.  This was also the first day he had taken alfuzosin prescribed for his urination.  He had not taken his medicine again.  He was seen by Dale Deforest, PA in follow-up and was stable.  I last saw him on Sep 27, 2020 when he came to the office with his son.  His son believes that the patient may have run out of his furosemide January.  The patient is unaware of.  They have also noticed some short-term memory loss and were concerned of whether or not he had a stroke.  He specifically denied any weakness or focal neurologic symptoms.  He has been on aspirin/Plavix for DAPT, atorvastatin 40 mg for hyperlipidemia, isosorbide 90 mg in the morning, 30 mg at night, losartan 100 mg  in the morning, metoprolol tartrate 25 mg in the morning and 12.5 mg at night.  On his formulary it still says furosemide 40 mg daily, but the son believes he ran out of this.    Since his May 2022 evaluation, he went to the emergency room on January 04, 2021 after developing syncope while taking a hot shower.  When EMS arrived they stood the patient up and he became bradycardic, pale, diaphoretic and had another syncopal episode.  ECG showed first-degree AV block.  Laboratory revealed normal serial troponins, BMP, D-dimer and CTA was negative for PE or acute pulmonary issues.  His electrolytes were normal.  CT of his head and neck were negative for acute injury.  Right upper quadrant ultrasound was negative.  He was treated with intravenous fluids.  Several days later he represented to the ED on January 07, 2021 with leg swelling was treated for cellulitis of the right leg with doxycycline 100 mg twice a day for 7 days.  He was last seen in the office on January 16, 2021 by Dale [name], Dale [name] for preoperative clearance prior to hernia repair by [facility] surgery.  At that time he continued to have right lower extremity edema with significant resolution of his prior erythema.  He was diagnosed with plasma cell myeloma based on a bone marrow biopsy in August 2022 and is followed by Dr. Delton Coombes.  Skeletal survey was negative for lytic lesions.  At that time, he was having  thoughts regarding his hernia surgery and ultimately preferred to defer this.  Presently, he denies any chest pain.  Apparently on November 18 he had another presyncopal spell and fell.  His lower extremity is better and he still notes some right ankle edema.  He denies chest pain, PND orthopnea.  He presents for evaluation.  Past Medical History:  Diagnosis Date   Allergic rhinitis    Anal fissure    Anginal pain (HCC)    Asthma    Cancer (HCC)    skin   Chest pain    CHF (congestive heart failure) ([place])    Coronary heart disease    s/p stenting. cath in 01/2012 noncritical occlusion   Dysrhythmia    1st degree heart block   GERD (gastroesophageal reflux disease)    Glaucoma    Hiatal hernia    Hyperlipidemia    Hypertension    Hypothyroidism    Idiopathic thrombocytopenic purpura ([facility]) 2002   Macular degeneration    Nephrolithiasis    PUD (peptic ulcer disease)    remote   Sarcoidosis    pulmonary   Schatzki's ring     Past Surgical History:  Procedure Laterality Date   cardiac stents     COLONOSCOPY  10/30/2006   Normal rectum, sigmoid diverticula.Remainder of colonic mucosa appeared normal.   CORONARY ANGIOPLASTY WITH STENT PLACEMENT     about 10 years ago per pt (around 2007)   [place], URETEROSCOPY AND STENT PLACEMENT Left 06/16/2017   Procedure: CYSTOSCOPY WITH RETROGRADE PYELOGRAM, URETEROSCOPY,STONE EXTRACTION  AND STENT PLACEMENT;  Surgeon: Franchot Gallo, MD;  Location: [facility];  Service: Urology;  Laterality: Left;   ESOPHAGOGASTRODUODENOSCOPY  06/19/2004   Two esophageal rings and esophageal web as described above.  All of these were disrupted by passing 56-French Venia Minks dilator/ Candida esophagitis,which appears to be incidental given history of   antibiotic use, but nevertheless will be treated.   ESOPHAGOGASTRODUODENOSCOPY  10/30/2006   Distal tandem esophageal ring status post dilation  disruption as  described above.   Otherwise normal esophagus/  Small hiatal hernia otherwise normal stomach, D1 and D2   ESOPHAGOGASTRODUODENOSCOPY N/A 03/22/2015   Dr.Rourk- noncritical schatzki's ring and hiatal hernia-o/w normal EGD.    ESOPHAGOGASTRODUODENOSCOPY (EGD) WITH ESOPHAGEAL DILATION  03/04/2012   RMR- schatzki's ring, hiatal hernia, polypoid gastric mucosa, bx= minimally active gastritis.   HOLMIUM LASER APPLICATION Left 2/58/5277   Procedure: HOLMIUM LASER APPLICATION;  Surgeon: Franchot Gallo, MD;  Location: [facility];  Service: Urology;  Laterality: Left;   IR GENERIC HISTORICAL  03/06/2016   IR RADIOLOGIST EVAL & MGMT 03/06/2016 Aletta Edouard, MD [facility] INTERV RAD   IR GENERIC HISTORICAL  06/18/2016   IR RADIOLOGIST EVAL & MGMT 06/18/2016 Aletta Edouard, MD [facility] INTERV RAD   IR RADIOLOGIST EVAL & MGMT  10/01/2016   IR RADIOLOGIST EVAL & MGMT  10/15/2017   IR RADIOLOGIST EVAL & MGMT  12/24/2018   IR RADIOLOGIST EVAL & MGMT  01/04/2020   LEFT HEART CATH N/A 02/02/2012   Procedure: LEFT HEART CATH;  Surgeon: Lorretta Harp, MD;  Location: [facility] CATH LAB;  Service: Cardiovascular;  Laterality: N/A;   MEDIASTINOSCOPY     for dx sarcoid   RADIOLOGY WITH ANESTHESIA Left 05/17/2016   Procedure: left renal ablation;  Surgeon: Aletta Edouard, MD;  Location: [facility];  Service: Radiology;  Laterality: Left;    Allergies  Allergen Reactions   Azithromycin Other (See Comments)    Sore mouth and fever blisters around mouth, sores in nose area as well   Doxazosin Shortness Of Breath   Acetaminophen Other (See Comments)    REACTION: UNKNOWN REACTION   Atenolol Other (See Comments)    REACTION: UNKNOWN REACTION   Hydrocodone Nausea And Vomiting   Hydrocodone-Acetaminophen Nausea Only   Levofloxacin Other (See Comments)    Caused stomach problems.   Morphine Other (See Comments)    "made me crazy"   Penicillins Nausea And Vomiting and Other (See Comments)    Has patient had a PCN reaction causing immediate rash,  facial/tongue/throat swelling, SOB or lightheadedness with hypotension: No Has patient had a PCN reaction causing severe rash involving mucus membranes or skin necrosis: No Has patient had a PCN reaction that required hospitalization No Has patient had a PCN reaction occurring within the last 10 years: No If all of the above answers are "NO", then may proceed with Cephalosporin use.    Sulfonamide Derivatives Nausea And Vomiting    Current Outpatient Medications  Medication Sig Dispense Refill   aspirin 81 MG tablet Take 81 mg by mouth every Monday, Wednesday, and Friday.     atorvastatin (LIPITOR) 40 MG tablet TAKE 1 TABLET BY MOUTH IN  THE EVENING (Patient taking differently: Take 40 mg by mouth every evening.) 90 tablet 3   atropine 1 % ophthalmic solution Place one drop into the right eye 2 (two) times daily for 14 days. Do not take tonight.  Dr. Ander Slade will evaluate your post-operative status tomorrow and may make changes to the duration and dose of this medication.     brimonidine (ALPHAGAN) 0.2 % ophthalmic solution Place 1 drop into both eyes 2 (two) times daily.     clopidogrel (PLAVIX) 75 MG tablet TAKE 1 TABLET BY MOUTH  DAILY 90 tablet 3   dexamethasone (DECADRON) 2 MG tablet Take 5 tablets (10 mg total) by mouth once a week. 20 tablet 3   dorzolamide-timolol (COSOPT) 22.3-6.8 MG/ML ophthalmic solution Place 1 drop into both eyes 2 (two)  times daily.     fluocinonide cream (LIDEX) 0.76 % Apply 1 application topically 2 (two) times daily.     furosemide (LASIX) 20 MG tablet If you notice any swelling, you may take 40 mg (two tablets) for the day. 90 tablet 3   isosorbide mononitrate (IMDUR) 60 MG 24 hr tablet Take 60 mg (one tablet) in the morning and 30 mg (one-half tablet) in the evening. 180 tablet 2   lenalidomide (REVLIMID) 5 MG capsule Take 1 capsule (5 mg total) by mouth daily. 14 days on, 7 days off 14 capsule 0   levothyroxine (SYNTHROID) 125 MCG tablet Take 125 mcg by mouth  daily before breakfast.     losartan (COZAAR) 100 MG tablet TAKE 1 TABLET BY MOUTH  DAILY 90 tablet 2   metoprolol tartrate (LOPRESSOR) 25 MG tablet TAKE 1 TABLET BY MOUTH IN  THE MORNING AND 1/2 TABLET  IN THE EVENING (Patient taking differently: Take 12.5-25 mg by mouth See admin instructions. 25 mg in the morning  12.5 mg in the evening) 135 tablet 3   mometasone (ELOCON) 0.1 % ointment Apply 1 application topically daily.      Multiple Vitamins-Minerals (PRESERVISION/LUTEIN) CAPS Take 1 capsule by mouth 2 (two) times daily.     nitroGLYCERIN (NITROSTAT) 0.4 MG SL tablet Place 1 tablet (0.4 mg total) under the tongue every 5 (five) minutes as needed. For chest pain 25 tablet 2   pantoprazole (PROTONIX) 40 MG tablet TAKE 1 TABLET BY MOUTH  DAILY BEFORE BREAKFAST (Patient taking differently: Take 40 mg by mouth daily.) 90 tablet 3   potassium chloride SA (KLOR-CON) 20 MEQ tablet Take 1 tablet (20 mEq total) by mouth daily. 30 tablet 0   prochlorperazine (COMPAZINE) 10 MG tablet Take 1 tablet (10 mg total) by mouth every 6 (six) hours as needed for nausea or vomiting. 30 tablet 3   ROCKLATAN 0.02-0.005 % SOLN Apply to eye.     triamcinolone (KENALOG) 0.1 % cream Apply 1 application topically 2 (two) times daily as needed (for irritation).     vitamin C (ASCORBIC ACID) 500 MG tablet Take 500 mg by mouth daily.     vitamin E 200 UNIT capsule Take 200 Units by mouth daily at 12 noon.      valACYclovir (VALTREX) 1000 MG tablet Take 1,000 mg by mouth 2 (two) times daily. (Patient not taking: Reported on 03/28/2021)     No current facility-administered medications for this visit.    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    Employer: RETIRED  Tobacco Use    Smoking status: Former    Packs/day: 0.10    Years: 2.00    Pack years: 0.20    Types: Cigarettes, Cigars    Quit date: 05/06/1970    Years since quitting: 50.9   Smokeless tobacco: Never  Vaping Use   Vaping Use: Never used  Substance and Sexual Activity   Alcohol use: No    Alcohol/week: 0.0 standard drinks   Drug use: No   Sexual activity: Never  Other Topics Concern   Not on file  Social History Narrative   Not on file   Social Determinants of Radio broadcast assistant  Strain: Not on file  Food Insecurity: Not on file  Transportation Needs: Not on file  Physical Activity: Not on file  Stress: Not on file  Social Connections: Not on file  Intimate Partner Violence: Not on file   Additional social history is notable that he is married and lives with his wife. He quit smoking over 40 years ago. Has one child. He remains active.  Family History  Problem Relation Age of Onset   Heart disease Father        deceased age 60   Stroke Mother    Alzheimer's disease Mother    Heart attack Brother        deceased age 42   Cancer Other        niece   Colon cancer Neg Hx    ROS General: Negative; No fevers, chills, or night sweats; Positive for weight loss HEENT: Positive for recent sinusitis.  No changes in vision., difficulty swallowing Pulmonary: Negative; No cough, wheezing, shortness of breath, hemoptysis Cardiovascular: See history of present illness GI: Negative; No nausea, vomiting, diarrhea, or abdominal pain GU: History of renal cell CA status post cryoablation; status post renal stone removal Musculoskeletal: Negative; no myalgias, joint pain, or weakness Hematologic/Oncology: Recently diagnosed plasma cell myeloma Endocrine: Positive for hypothyroidism; no diabetes Neuro: Negative; no changes in balance, headaches Skin: Negative; No rashes or skin lesions Psychiatric: Negative; No behavioral problems, depression Sleep: Negative; No snoring, daytime  sleepiness, hypersomnolence, bruxism, restless legs, hypnogognic hallucinations, no cataplexy Other comprehensive 14 point system review is negative.   PE BP (!) 134/56 (BP Location: Right Arm, Patient Position: Sitting, Cuff Size: Normal)   Pulse 73   Ht _0  (1.727 m)   Wt 132 lb (59.9 kg)   BMI 20.07 kg/m    Repeat blood pressure by me was 160/60.  Wt Readings from Last 3 Encounters:  03/28/21 132 lb (59.9 kg)  03/01/21 138 lb (62.6 kg)  02/07/21 134 lb (60.8 kg)   General: Alert, oriented, no distress.  Skin: normal turgor, no rashes, warm and dry HEENT: Normocephalic, atraumatic. Pupils equal round and reactive to light; sclera anicteric; extraocular muscles intact; Fundi ** Nose without nasal septal hypertrophy Mouth/Parynx benign; Mallinpatti scale 3 Neck: No JVD, no carotid bruits; normal carotid upstroke Lungs: clear to ausculatation and percussion; no wheezing or rales Chest wall: without tenderness to palpitation Heart: PMI not displaced, RRR, s1 s2 normal, 2/6 systolic murmur in the aortic area., no diastolic murmur, no rubs, gallops, thrills, or heaves Abdomen: Hernia; soft, nontender; no hepatosplenomehaly, BS+; abdominal aorta nontender and not dilated by palpation. Back: no CVA tenderness Pulses 2+ Musculoskeletal: full range of motion, normal strength, no joint deformities Extremities: no clubbing cyanosis or edema, Homan's sign negative  Neurologic: grossly nonfocal; Cranial nerves grossly wnl Psychologic: Normal mood and affect   March 28, 2021 ECG (independently read by me):  NSR at 73; ; QTc 416 msec; no ectopy  Sep 27, 2020 ECG (independently read by me): Normal sinus rhythm at 70, borderline first-degree AV block with a PR interval of 208 ms.  No ectopy.  June 2021 ECG (independently read by me): Normal sinus rhythm at 60 bpm, first-degree AV block.  PR nterval to 244 ms  March 18, 2019 ECG (independently read by me): Normal sinus rhythm at 62  bpm.  First-degree AV block with a parable 244 ms.  1 isolated PVC.  No ST segment change  July 2020 ECG (independently read by me):Sinus rhythm at  68 bpm with first-degree AV block ( PR interval 236 msec) and frequent unifocal PVCs  Sep 09, 2018 ECG (independently read by me): Sinus bradycardia with first-degree AV block with ventricular rate of 59 bpm.  PR interval 244 ms.  June 02, 2018 ECG (independently read by me): Sinus rhythm at 62 bpm with first-degree block with apparent of what to 40 ms.  No significant ST-T changes.  July 24, 2017 ECG (independently read by me): normal sinus rhythm at 60 bpm with first-degree AV block.  PR interval 236 ms.  QTc interval normal.  No ectopy.  October 2018 ECG (independently read by me): Sinus bradycardia 50 bpm.  First degree AV block with a PR interval at 224 ms.  No syncope ST changes.  September 2017 ECG (independently read by me): Sinus bradycardia 59 bpm.  First-degree AV block with PR interval 240 ms.  No significant ST-T changes.  November 2016 ECG (independently read by me): Sinus bradycardia at 55 beats per minute with first-degree AV block.  PR interval 246 ms.  No significant ST-T changes.  May 2016 ECG (independently read by me): Sinus rhythm with first-degree heart block.  PR interval 226.  No ST segment changes.  February 2016 ECG (independently read by me): Sinus rhythm at 65 bpm.  Mild first-degree AV block with a PR interval 228 ms.  No significant ST segment changes.  December 2015 ECG (independently read by me): Sinus bradycardia 57 bpm.  Borderline first-degree AV block with a PR interval 216 daily.  Seconds.  QTc interval is normal at 373 ms.  June 2015 ECG (independently read by me): Sinus bradycardia 54 beats per minute.  First degree block with a PR interval of 232 ms.  No significant ST-T changes.  Prior ECG: Sinus bradycardia 54 beats per minute. PR interval 206 ms. QTc interval normal  LABS: BMP Latest Ref Rng &  Units 04/02/2021 03/08/2021 03/01/2021  Glucose 70 - 99 mg/dL 138(H) 102(H) 114(H)  BUN 8 - 23 mg/dL _0 Creatinine 0.61 - 1.24 mg/dL 0.86 0.74 0.76  BUN/Creat Ratio 10 - 24 - - -  Sodium 135 - 145 mmol/L 134(L) 135 133(L)  Potassium 3.5 - 5.1 mmol/L 3.4(L) 3.6 3.7  Chloride 98 - 111 mmol/L 104 105 103  CO2 22 - 32 mmol/L _1 Calcium 8.9 - 10.3 mg/dL 8.8(L) 8.9 8.7(L)   Hepatic Function Latest Ref Rng & Units 04/02/2021 03/08/2021 03/01/2021  Total Protein 6.5 - 8.1 g/dL 7.2 7.3 6.9  Albumin 3.5 - 5.0 g/dL 2.9(L) 2.9(L) 3.0(L)  AST 15 - 41 U/L _2 ALT 0 - 44 U/L _3 Alk Phosphatase 38 - 126 U/L 116 82 105  Total Bilirubin 0.3 - 1.2 mg/dL 0.9 0.4 0.8  Bilirubin, Direct 0.0 - 0.2 mg/dL - - -   CBC Latest Ref Rng & Units 04/02/2021 03/15/2021 03/08/2021  WBC 4.0 - 10.5 K/uL 6.6 4.6 3.8(L)  Hemoglobin 13.0 - 17.0 g/dL 7.2(L) 7.8(L) 8.3(L)  Hematocrit 39.0 - 52.0 % 22.0(L) 24.3(L) 26.6(L)  Platelets 150 - 400 K/uL 62(L) 112(L) 76(L)   Lab Results  Component Value Date   MCV 120.9 (H) 04/02/2021   MCV 119.7 (H) 03/15/2021   MCV 121.5 (H) 03/08/2021   Lab Results  Component Value Date   TSH 3.010 10/26/2019  No results [name] for: HGBA1C   Lipid Panel     Component Value Date/Time   CHOL 123 10/26/2019 1013  TRIG 107 10/26/2019 1013   HDL 41 10/26/2019 1013   CHOLHDL 3.0 10/26/2019 1013   CHOLHDL 2.6 01/02/2017 0612   VLDL 10 01/02/2017 0612   LDLCALC 62 10/26/2019 1013   RADIOLOGY: No results [name].  IMPRESSION:  1. Coronary artery disease involving native coronary artery of native heart with angina pectoris ([place])   2. Essential hypertension   3. Hyperlipidemia LDL goal <70   4. Systolic murmur   5. Ankle edema   6. Multiple myeloma, remission status unspecified ([place])     ASSESSMENT AND PLAN: Mr. Esau Fridman is a 87 years old gentleman who has known CAD and underwent initial intervention to his RCA in 2000 and subsequent intervention in  2009. At last catheterization in 2013 his RCA stents were patent and he had mild 30% LAD narrowing.  He has had leg edema issues which resulted in changing his HCTZ to furosemide in the past.  He has had issues with edema, and has had presyncope/syncope as well as some previous chest pain which had responded to nitrates.  I reviewed all that has transpired since my last evaluation.  Presently, his blood pressure is elevated today.  He states he has had a recent presyncopal spell a week ago resulting in a fall.  He had resolution of his lower extremity cellulitis and swelling has improved but right ankle edema 1+ persists.  I have strongly recommended support stockings.  He is not having any anginal symptoms.  With his episodes of presyncope/syncope I am decreasing his isosorbide from 60 mg twice a day to 60 mg in the morning and 30 mg at night.  He has a 2/6 systolic murmur.  I am scheduling him for follow-up 2D echo Doppler study for reassessment of LV function and valvular architecture.  I have recommended he reduce his furosemide to 20 mg.  He is not planning to have hernia surgery.  He was recently diagnosed with plasma some myeloma and is now followed by Dr. Delton Coombes.  I have recommended that he monitor his heart rate and blood pressure and return in 2 to 3 [name] to see Dale [name], Dale [name] for reevaluation.  Troy Sine, MD, [facility]  04/04/2021 3:31 PM

## 2021-03-28 NOTE — Patient Instructions (Signed)
Medication Instructions:  Take isosorbide 60 mg (one tablet) in the morning and 30 mg (one-half tablet) in the evening.  Take furosemide 20 mg every day. If you have any swelling, you may take 40 mg.   *If you need a refill on your cardiac medications before your next appointment, please call your pharmacy*   Lab Work: None  Testing/Procedures: Your physician has requested that you have an echocardiogram. Echocardiography is a painless test that uses sound waves to create images of your heart. It provides your doctor with information about the size and shape of your heart and how well your heart's chambers and valves are working. This procedure takes approximately one hour. There are no restrictions for this procedure.   Follow-Up: At [facility], you and your health needs are our priority.  As part of our continuing mission to provide you with exceptional heart care, we have created designated Provider Care Teams.  These Care Teams include your primary Cardiologist (physician) and Advanced Practice Providers (APPs -  Physician Assistants and Nurse Practitioners) who all work together to provide you with the care you need, when you need it.  We recommend signing up for the patient portal called "MyChart".  Sign up information is provided on this After Visit Summary.  MyChart is used to connect with patients for Virtual Visits (Telemedicine).  Patients are able to view lab/test results, encounter notes, upcoming appointments, etc.  Non-urgent messages can be sent to your provider as well.   To learn more about what you can do with MyChart, go to [email or web address].    Your next appointment:   3 month(s)  The format for your next appointment:   In Person  Provider:   [name], PA-C      Then, Shelva Majestic, MD will plan to see you again in 6 month(s).   Other Instructions Wear support stockings

## 2021-04-02 ENCOUNTER — Inpatient Hospital Stay (HOSPITAL_COMMUNITY): Payer: Medicare Other

## 2021-04-02 DIAGNOSIS — C9000 Multiple myeloma not having achieved remission: Secondary | ICD-10-CM

## 2021-04-02 DIAGNOSIS — D649 Anemia, unspecified: Secondary | ICD-10-CM

## 2021-04-02 LAB — CBC WITH DIFFERENTIAL/PLATELET
Basophils Absolute: 0 10*3/uL (ref 0.0–0.1)
Basophils Relative: 0 %
Eosinophils Absolute: 0 10*3/uL (ref 0.0–0.5)
Eosinophils Relative: 0 %
HCT: 22 % — ABNORMAL LOW (ref 39.0–52.0)
Hemoglobin: 7.2 g/dL — ABNORMAL LOW (ref 13.0–17.0)
Lymphocytes Relative: 30 %
Lymphs Abs: 2 10*3/uL (ref 0.7–4.0)
MCH: 39.6 pg — ABNORMAL HIGH (ref 26.0–34.0)
MCHC: 32.7 g/dL (ref 30.0–36.0)
MCV: 120.9 fL — ABNORMAL HIGH (ref 80.0–100.0)
Monocytes Absolute: 0.5 10*3/uL (ref 0.1–1.0)
Monocytes Relative: 8 %
Neutro Abs: 4.1 10*3/uL (ref 1.7–7.7)
Neutrophils Relative %: 62 %
Platelets: 62 10*3/uL — ABNORMAL LOW (ref 150–400)
RBC: 1.82 MIL/uL — ABNORMAL LOW (ref 4.22–5.81)
RDW: 15.6 % — ABNORMAL HIGH (ref 11.5–15.5)
WBC: 6.6 10*3/uL (ref 4.0–10.5)
nRBC: 0 % (ref 0.0–0.2)

## 2021-04-02 LAB — SAMPLE TO BLOOD BANK

## 2021-04-02 LAB — COMPREHENSIVE METABOLIC PANEL
ALT: 15 U/L (ref 0–44)
AST: 15 U/L (ref 15–41)
Albumin: 2.9 g/dL — ABNORMAL LOW (ref 3.5–5.0)
Alkaline Phosphatase: 116 U/L (ref 38–126)
Anion gap: 8 (ref 5–15)
BUN: 21 mg/dL (ref 8–23)
CO2: 22 mmol/L (ref 22–32)
Calcium: 8.8 mg/dL — ABNORMAL LOW (ref 8.9–10.3)
Chloride: 104 mmol/L (ref 98–111)
Creatinine, Ser: 0.86 mg/dL (ref 0.61–1.24)
GFR, Estimated: >60 mL/min (ref >60)
Glucose, Bld: 138 mg/dL — ABNORMAL HIGH (ref 70–99)
Potassium: 3.4 mmol/L — ABNORMAL LOW (ref 3.5–5.1)
Sodium: 134 mmol/L — ABNORMAL LOW (ref 135–145)
Total Bilirubin: 0.9 mg/dL (ref 0.3–1.2)
Total Protein: 7.2 g/dL (ref 6.5–8.1)

## 2021-04-02 LAB — LACTATE DEHYDROGENASE: LDH: 124 U/L (ref 98–192)

## 2021-04-03 ENCOUNTER — Telehealth (HOSPITAL_COMMUNITY): Payer: Self-pay | Admitting: *Deleted

## 2021-04-03 LAB — KAPPA/LAMBDA LIGHT CHAINS
Kappa free light chain: 13 mg/L (ref 3.3–19.4)
Kappa, lambda light chain ratio: 0.03 — ABNORMAL LOW (ref 0.26–1.65)
Lambda free light chains: 394.4 mg/L — ABNORMAL HIGH (ref 5.7–26.3)

## 2021-04-03 NOTE — Telephone Encounter (Signed)
CBC results from 04/02/2021 reviewed with Dr. Delton Coombes.  All labs WNL/stable with exception of platelet count, which has dropped from 112 to 62.  Per MD, pt to hold Revlimid (due to restart on 12/1, 14 days on, 7 days off) until he sees him in clinic on 04/09/21.  We will repeat CBC on that day per MD and possibly restart Revlimid at that time. I spoke with pt's wife and informed her of the above.  She verbalizes understanding of the above plan.

## 2021-04-04 ENCOUNTER — Encounter: Payer: Self-pay | Admitting: Cardiovascular Disease

## 2021-04-04 LAB — PROTEIN ELECTROPHORESIS, SERUM
A/G Ratio: 0.7 (ref 0.7–1.7)
Albumin ELP: 2.9 g/dL (ref 2.9–4.4)
Alpha-1-Globulin: 0.3 g/dL (ref 0.0–0.4)
Alpha-2-Globulin: 0.7 g/dL (ref 0.4–1.0)
Beta Globulin: 0.7 g/dL (ref 0.7–1.3)
Gamma Globulin: 2.2 g/dL — ABNORMAL HIGH (ref 0.4–1.8)
Globulin, Total: 4 g/dL — ABNORMAL HIGH (ref 2.2–3.9)
M-Spike, %: 2.1 g/dL — ABNORMAL HIGH
Total Protein ELP: 6.9 g/dL (ref 6.0–8.5)

## 2021-04-06 ENCOUNTER — Other Ambulatory Visit (HOSPITAL_COMMUNITY): Payer: Self-pay | Admitting: *Deleted

## 2021-04-06 DIAGNOSIS — C9000 Multiple myeloma not having achieved remission: Secondary | ICD-10-CM

## 2021-04-08 NOTE — Progress Notes (Signed)
[place], [place] [address]   CLINIC:  Medical Oncology/Hematology  PCP:  [facility], [place] / [place] [address]  [phone]  REASON FOR VISIT:  Follow-up for multiple myeloma and anemia  PRIOR THERAPY: none  CURRENT THERAPY: surveillance  INTERVAL HISTORY:  [name], a 87 y.o. male, returns for routine follow-up for his multiple myeloma and anemia. Dale [name] was last seen on 03/01/2021.  Today he reports feeling well, and he is accompanied by his son. He denies fatigue. He reports one episode of syncope 2-3 weeks ago. He has not started Revlimid yet. He reports 1 episode of black stool this morning. He has lost 10 pounds since his last visit on 10/27. He eats 3 meals daily. He denies nausea and diarrhea.   REVIEW OF SYSTEMS:  Review of Systems  Constitutional:  Positive for unexpected weight change (-10 lbs). Negative for appetite change (75%) and fatigue (50%).  Gastrointestinal:  Positive for constipation. Negative for diarrhea and nausea.       Black stool x1  All other systems reviewed and are negative.  PAST MEDICAL/SURGICAL HISTORY:  Past Medical History:  Diagnosis Date   Allergic rhinitis    Anal fissure    Anginal pain (HCC)    Asthma    Cancer (HCC)    skin   Chest pain    CHF (congestive heart failure) ([place])    Coronary heart disease    s/p stenting. cath in 01/2012 noncritical occlusion   Dysrhythmia    1st degree heart block   GERD (gastroesophageal reflux disease)    Glaucoma    Hiatal hernia    Hyperlipidemia    Hypertension    Hypothyroidism    Idiopathic thrombocytopenic purpura ([place]) 2002   Macular degeneration    Nephrolithiasis    PUD (peptic ulcer disease)    remote   Sarcoidosis    pulmonary   Schatzki's ring    Past Surgical History:  Procedure Laterality Date   cardiac stents     COLONOSCOPY  10/30/2006   Normal rectum, sigmoid diverticula.Remainder of  colonic mucosa appeared normal.   CORONARY ANGIOPLASTY WITH STENT PLACEMENT     about 10 years ago per pt (around 2007)   [place], URETEROSCOPY AND STENT PLACEMENT Left 06/16/2017   Procedure: CYSTOSCOPY WITH RETROGRADE PYELOGRAM, URETEROSCOPY,STONE EXTRACTION  AND STENT PLACEMENT;  Surgeon: Franchot Gallo, MD;  Location: [facility];  Service: Urology;  Laterality: Left;   ESOPHAGOGASTRODUODENOSCOPY  06/19/2004   Two esophageal rings and esophageal web as described above.  All of these were disrupted by passing 56-French Venia Minks dilator/ Candida esophagitis,which appears to be incidental given history of   antibiotic use, but nevertheless will be treated.   ESOPHAGOGASTRODUODENOSCOPY  10/30/2006   Distal tandem esophageal ring status post dilation disruption as  described above.  Otherwise normal esophagus/  Small hiatal hernia otherwise normal stomach, D1 and D2   ESOPHAGOGASTRODUODENOSCOPY N/A 03/22/2015   Dr.Rourk- noncritical schatzki's ring and hiatal hernia-o/w normal EGD.    ESOPHAGOGASTRODUODENOSCOPY (EGD) WITH ESOPHAGEAL DILATION  03/04/2012   RMR- schatzki's ring, hiatal hernia, polypoid gastric mucosa, bx= minimally active gastritis.   HOLMIUM LASER APPLICATION Left 3/81/8299   Procedure: HOLMIUM LASER APPLICATION;  Surgeon: Franchot Gallo, MD;  Location: [facility];  Service: Urology;  Laterality: Left;   IR GENERIC HISTORICAL  03/06/2016   IR RADIOLOGIST EVAL & MGMT 03/06/2016 Aletta Edouard, MD [facility] INTERV RAD   IR  GENERIC HISTORICAL  06/18/2016   IR RADIOLOGIST EVAL & MGMT 06/18/2016 Aletta Edouard, MD [facility] INTERV RAD   IR RADIOLOGIST EVAL & MGMT  10/01/2016   IR RADIOLOGIST EVAL & MGMT  10/15/2017   IR RADIOLOGIST EVAL & MGMT  12/24/2018   IR RADIOLOGIST EVAL & MGMT  01/04/2020   LEFT HEART CATH N/A 02/02/2012   Procedure: LEFT HEART CATH;  Surgeon: Lorretta Harp, MD;  Location: [facility] CATH LAB;  Service: Cardiovascular;  Laterality: N/A;    MEDIASTINOSCOPY     for dx sarcoid   RADIOLOGY WITH ANESTHESIA Left 05/17/2016   Procedure: left renal ablation;  Surgeon: Aletta Edouard, MD;  Location: [facility];  Service: Radiology;  Laterality: Left;    SOCIAL HISTORY:  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    Employer: RETIRED  Tobacco Use   Smoking status: Former    Packs/day: 0.10    Years: 2.00    Pack years: 0.20    Types: Cigarettes, Cigars    Quit date: 05/06/1970    Years since quitting: 50.9   Smokeless tobacco: Never  Vaping Use   Vaping Use: Never used  Substance and Sexual Activity   Alcohol use: No    Alcohol/week: 0.0 standard drinks   Drug use: No   Sexual activity: Never  Other Topics Concern   Not on file  Social History Narrative   Not on file   Social Determinants of Health   Financial Resource Strain: Not on file  Food Insecurity: Not on file  Transportation Needs: Not on file  Physical Activity: Not on file  Stress: Not on file  Social Connections: Not on file  Intimate Partner Violence: Not on file    FAMILY HISTORY:  Family History  Problem Relation Age of Onset   Heart disease Father        deceased age 57   Stroke Mother    Alzheimer's disease Mother    Heart attack Brother        deceased age 16   Cancer Other        niece   Colon cancer Neg Hx     CURRENT MEDICATIONS:  Current Outpatient Medications  Medication Sig Dispense Refill   aspirin 81 MG tablet Take 81 mg by mouth every Monday, Wednesday, and Friday.     atorvastatin (LIPITOR) 40 MG tablet TAKE 1 TABLET BY MOUTH IN  THE EVENING (Patient taking differently: Take 40 mg by mouth every evening.) 90 tablet 3   atropine 1 % ophthalmic solution Place one drop into the right eye 2 (two)  times daily for 14 days. Do not take tonight.  Dr. Ander Slade will evaluate your post-operative status tomorrow and may make changes to the duration and dose of this medication.     brimonidine (ALPHAGAN) 0.2 % ophthalmic solution Place 1 drop into both eyes 2 (two) times daily.     clopidogrel (PLAVIX) 75 MG tablet TAKE 1 TABLET BY MOUTH  DAILY 90 tablet 3   dexamethasone (DECADRON) 2 MG tablet Take 5 tablets (10 mg total) by mouth once a week. 20 tablet 3   dorzolamide-timolol (COSOPT) 22.3-6.8 MG/ML ophthalmic solution Place 1 drop into both eyes 2 (two)  times daily.     fluocinonide cream (LIDEX) 3.78 % Apply 1 application topically 2 (two) times daily.     furosemide (LASIX) 20 MG tablet If you notice any swelling, you may take 40 mg (two tablets) for the day. 90 tablet 3   isosorbide mononitrate (IMDUR) 60 MG 24 hr tablet Take 60 mg (one tablet) in the morning and 30 mg (one-half tablet) in the evening. 180 tablet 2   lenalidomide (REVLIMID) 5 MG capsule Take 1 capsule (5 mg total) by mouth daily. 14 days on, 7 days off 14 capsule 0   levothyroxine (SYNTHROID) 125 MCG tablet Take 125 mcg by mouth daily before breakfast.     losartan (COZAAR) 100 MG tablet TAKE 1 TABLET BY MOUTH  DAILY 90 tablet 2   metoprolol tartrate (LOPRESSOR) 25 MG tablet TAKE 1 TABLET BY MOUTH IN  THE MORNING AND 1/2 TABLET  IN THE EVENING (Patient taking differently: Take 12.5-25 mg by mouth See admin instructions. 25 mg in the morning  12.5 mg in the evening) 135 tablet 3   mometasone (ELOCON) 0.1 % ointment Apply 1 application topically daily.      Multiple Vitamins-Minerals (PRESERVISION/LUTEIN) CAPS Take 1 capsule by mouth 2 (two) times daily.     pantoprazole (PROTONIX) 40 MG tablet TAKE 1 TABLET BY MOUTH  DAILY BEFORE BREAKFAST (Patient taking differently: Take 40 mg by mouth daily.) 90 tablet 3   potassium chloride SA (KLOR-CON) 20 MEQ tablet Take 1 tablet (20 mEq total) by mouth daily. 30 tablet 0   ROCKLATAN  0.02-0.005 % SOLN Apply to eye.     triamcinolone (KENALOG) 0.1 % cream Apply 1 application topically 2 (two) times daily as needed (for irritation).     valACYclovir (VALTREX) 1000 MG tablet Take 1,000 mg by mouth 2 (two) times daily.     vitamin C (ASCORBIC ACID) 500 MG tablet Take 500 mg by mouth daily.     vitamin E 200 UNIT capsule Take 200 Units by mouth daily at 12 noon.      nitroGLYCERIN (NITROSTAT) 0.4 MG SL tablet Place 1 tablet (0.4 mg total) under the tongue every 5 (five) minutes as needed. For chest pain (Patient not taking: Reported on 04/09/2021) 25 tablet 2   prochlorperazine (COMPAZINE) 10 MG tablet Take 1 tablet (10 mg total) by mouth every 6 (six) hours as needed for nausea or vomiting. (Patient not taking: Reported on 04/09/2021) 30 tablet 3   No current facility-administered medications for this visit.    ALLERGIES:  Allergies  Allergen Reactions   Azithromycin Other (See Comments)    Sore mouth and fever blisters around mouth, sores in nose area as well   Doxazosin Shortness Of Breath   Acetaminophen Other (See Comments)    REACTION: UNKNOWN REACTION   Atenolol Other (See Comments)    REACTION: UNKNOWN REACTION   Hydrocodone Nausea And Vomiting   Hydrocodone-Acetaminophen Nausea Only   Levofloxacin Other (See Comments)    Caused stomach problems.   Morphine Other (See Comments)    "made me crazy"   Penicillins Nausea And Vomiting and Other (See Comments)    Has patient had a PCN reaction causing immediate rash, facial/tongue/throat swelling, SOB or lightheadedness with hypotension: No Has patient had a PCN reaction causing severe rash involving mucus membranes or skin necrosis: No Has patient had a PCN reaction that required hospitalization No Has patient had a PCN reaction occurring within the last 10 years: No If all of the above answers  are "NO", then may proceed with Cephalosporin use.    Sulfonamide Derivatives Nausea And Vomiting    PHYSICAL EXAM:   Performance status (ECOG): 0 - Asymptomatic  Vitals:   04/09/21 1315  BP: (!) 149/72  Pulse: 71  Resp: 18  Temp: (!) 97.2 F (36.2 C)  SpO2: 92%   Wt Readings from Last 3 Encounters:  04/09/21 128 lb 12 oz (58.4 kg)  03/28/21 132 lb (59.9 kg)  03/01/21 138 lb (62.6 kg)   Physical Exam Vitals reviewed.  Constitutional:      Appearance: Normal appearance.  Cardiovascular:     Rate and Rhythm: Normal rate and regular rhythm.     Pulses: Normal pulses.     Heart sounds: Normal heart sounds.  Pulmonary:     Effort: Pulmonary effort is normal.     Breath sounds: Normal breath sounds.  Musculoskeletal:     Right lower leg: 1+ Edema present.     Left lower leg: 1+ Edema present.  Neurological:     General: No focal deficit present.     Mental Status: He is alert and oriented to person, place, and time.  Psychiatric:        Mood and Affect: Mood normal.        Behavior: Behavior normal.    LABORATORY DATA:  I have reviewed the labs as listed.  CBC Latest Ref Rng & Units 04/09/2021 04/02/2021 03/15/2021  WBC 4.0 - 10.5 K/uL 4.1 6.6 4.6  Hemoglobin 13.0 - 17.0 g/dL 7.2(L) 7.2(L) 7.8(L)  Hematocrit 39.0 - 52.0 % 23.0(L) 22.0(L) 24.3(L)  Platelets 150 - 400 K/uL 96(L) 62(L) 112(L)   CMP Latest Ref Rng & Units 04/02/2021 03/08/2021 03/01/2021  Glucose 70 - 99 mg/dL 138(H) 102(H) 114(H)  BUN 8 - 23 mg/dL _0 Creatinine 0.61 - 1.24 mg/dL 0.86 0.74 0.76  Sodium 135 - 145 mmol/L 134(L) 135 133(L)  Potassium 3.5 - 5.1 mmol/L 3.4(L) 3.6 3.7  Chloride 98 - 111 mmol/L 104 105 103  CO2 22 - 32 mmol/L _1 Calcium 8.9 - 10.3 mg/dL 8.8(L) 8.9 8.7(L)  Total Protein 6.5 - 8.1 g/dL 7.2 7.3 6.9  Total Bilirubin 0.3 - 1.2 mg/dL 0.9 0.4 0.8  Alkaline Phos 38 - 126 U/L 116 82 105  AST 15 - 41 U/L _2 ALT 0 - 44 U/L _3 Component Value Date/Time   RBC 1.90 (L) 04/09/2021 1202   MCV 121.1 (H) 04/09/2021 1202   MCV 107 (H) 10/26/2019 1013   MCH 37.9 (H)  04/09/2021 1202   MCHC 31.3 04/09/2021 1202   RDW 15.7 (H) 04/09/2021 1202   RDW 12.7 10/26/2019 1013   LYMPHSABS 2.8 04/09/2021 1202   LYMPHSABS CANCELED 09/08/2015 0933   MONOABS 0.2 04/09/2021 1202   EOSABS 0.0 04/09/2021 1202   EOSABS CANCELED 09/08/2015 0933   BASOSABS 0.0 04/09/2021 1202   BASOSABS CANCELED 09/08/2015 0933    DIAGNOSTIC IMAGING:  I have independently reviewed the scans and discussed with the patient. No results found.   ASSESSMENT:  1.  IgG lambda plasma cell myeloma: - Work-up for macrocytic anemia on 12/01/2020 showed M spike of 2.9 g.  Immunofixation IgG lambda. - Lambda light chains elevated at 284.  Light chain ratio was 0.04.  LDH was 163.  Creatinine was 0.9 and calcium 8.9. - Bone marrow biopsy on 12/18/2020-hypercellular marrow for age with 48% atypical plasma cells with lambda light chain  restriction. - Chromosome analysis was normal. 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 - Revlimid 5 mg, 2 weeks on/1 week off started on 01/20/2021.  Dexamethasone weekly 10 mg added on 02/07/2021.  2.  Macrocytic anemia: - CBC on 12/01/2020 with hemoglobin 8.5 and MCV of 117. - Denies any bleeding per rectum or melena.  3.  Social/family history: - Lives at home with his wife.  He does all ADLs and IADLs.  He even does yard work. - He worked at [employer] for 35 years.  Denies any chemical exposure.  Non-smoker. - No family history of malignancies.   PLAN:  1.  IgG lambda plasma cell myeloma, high risk: - We have reviewed myeloma panel from 04/02/2021.  M spike is more or less stable at 2.1 g.  Lambda light chain slightly improved to 394.  Ratio is 0.03. - Reviewed labs from last week which showed normal creatinine 0.86.  Calcium is 8.8.  LFTs are normal.  CBC shows normal platelet count.  White count is 4.1 with ANC of 1.1.  Hemoglobin 7.2. - He was evaluated by cardiology Dr.  Claiborne Billings and isosorbide was dose reduced to 60 mg in the morning and 30 mg at night.  He reportedly had near syncopal episode 2 weeks ago. - His weight is down by 10 pounds.  Not sure if it is fluid loss. - He was recommended to start Revlimid 5 mg 2 weeks on/1 week off.  Continue dexamethasone 10 mg on Mondays.  RTC 3 weeks for follow-up.  2.  Macrocytic anemia: - Nutritional deficiency work-up was negative. - Hemoglobin is 7.2 today.  Recommend 1 unit PRBC.  He reported 1 episode of black stools.  3.  Thromboprophylaxis: - Continue Plavix daily.  Continue aspirin 81 mg 3 times weekly.  4.  Right ankle swelling: - He has 1+ edema in the lower extremities.  Continue Lasix as needed.  Orders placed this encounter:  No orders of the defined types were placed in this encounter.    Derek Jack, MD [place] [phone]   I, Thana Ates, am acting as a scribe for Dr. Derek Jack.  I, Derek Jack MD, have reviewed the above documentation for accuracy and completeness, and I agree with the above.

## 2021-04-09 ENCOUNTER — Inpatient Hospital Stay (HOSPITAL_COMMUNITY): Payer: Medicare Other

## 2021-04-09 ENCOUNTER — Other Ambulatory Visit: Payer: Self-pay

## 2021-04-09 ENCOUNTER — Inpatient Hospital Stay (HOSPITAL_COMMUNITY): Payer: Medicare Other | Attending: Hematology | Admitting: Hematology

## 2021-04-09 ENCOUNTER — Other Ambulatory Visit (HOSPITAL_COMMUNITY): Payer: Self-pay

## 2021-04-09 VITALS — BP 149/72 | HR 71 | Temp 97.2°F | Resp 18 | Ht 67.72 in | Wt 128.7 lb

## 2021-04-09 DIAGNOSIS — D649 Anemia, unspecified: Secondary | ICD-10-CM | POA: Diagnosis not present

## 2021-04-09 DIAGNOSIS — D696 Thrombocytopenia, unspecified: Secondary | ICD-10-CM

## 2021-04-09 DIAGNOSIS — D539 Nutritional anemia, unspecified: Secondary | ICD-10-CM | POA: Diagnosis not present

## 2021-04-09 DIAGNOSIS — C9000 Multiple myeloma not having achieved remission: Secondary | ICD-10-CM | POA: Diagnosis not present

## 2021-04-09 LAB — CBC WITH DIFFERENTIAL/PLATELET
Basophils Absolute: 0 10*3/uL (ref 0.0–0.1)
Basophils Relative: 0 %
Eosinophils Absolute: 0 10*3/uL (ref 0.0–0.5)
Eosinophils Relative: 0 %
HCT: 23 % — ABNORMAL LOW (ref 39.0–52.0)
Hemoglobin: 7.2 g/dL — ABNORMAL LOW (ref 13.0–17.0)
Lymphocytes Relative: 68 %
Lymphs Abs: 2.8 10*3/uL (ref 0.7–4.0)
MCH: 37.9 pg — ABNORMAL HIGH (ref 26.0–34.0)
MCHC: 31.3 g/dL (ref 30.0–36.0)
MCV: 121.1 fL — ABNORMAL HIGH (ref 80.0–100.0)
Monocytes Absolute: 0.2 10*3/uL (ref 0.1–1.0)
Monocytes Relative: 5 %
Neutro Abs: 1.1 10*3/uL — ABNORMAL LOW (ref 1.7–7.7)
Neutrophils Relative %: 27 %
Platelets: 96 10*3/uL — ABNORMAL LOW (ref 150–400)
RBC: 1.9 MIL/uL — ABNORMAL LOW (ref 4.22–5.81)
RDW: 15.7 % — ABNORMAL HIGH (ref 11.5–15.5)
WBC: 4.1 10*3/uL (ref 4.0–10.5)
nRBC: 0 % (ref 0.0–0.2)

## 2021-04-09 LAB — PREPARE RBC (CROSSMATCH)

## 2021-04-09 LAB — LACTATE DEHYDROGENASE: LDH: 120 U/L (ref 98–192)

## 2021-04-09 NOTE — Patient Instructions (Signed)
[place] at [facility] Discharge Instructions  You were seen and examined today by Dr. Delton Coombes. He reviewed your most recent labs and your hemoglobin is low. He recommends that you have a blood transfusion. Start back taking the Revlimid today take two weeks and then one week off. Please follow up as scheduled in 3 weeks.   Thank you for choosing [place] at [facility] to provide your oncology and hematology care.  To afford each patient quality time with our provider, please arrive at least 15 minutes before your scheduled appointment time.   If you have a lab appointment with the [facility] please come in thru the Main Entrance and check in at the main information desk.  You need to re-schedule your appointment should you arrive 10 or more minutes late.  We strive to give you quality time with our providers, and arriving late affects you and other patients whose appointments are after yours.  Also, if you no show three or more times for appointments you may be dismissed from the clinic at the providers discretion.     Again, thank you for choosing [facility].  Our hope is that these requests will decrease the amount of time that you wait before being seen by our physicians.       _____________________________________________________________  Should you have questions after your visit to [facility], please contact our office at [phone] and follow the prompts.  Our office hours are 8:00 a.m. and 4:30 p.m. Monday - Friday.  Please note that voicemails left after 4:00 p.m. may not be returned until the following business day.  We are closed weekends and major holidays.  You do have access to a nurse 24-7, just call the main number to the clinic [phone] and do not press any options, hold on the line and a nurse will answer the phone.    For prescription refill requests, have your pharmacy contact our office  and allow 72 hours.    Due to Covid, you will need to wear a mask upon entering the hospital. If you do not have a mask, a mask will be given to you at the Main Entrance upon arrival. For doctor visits, patients may have 1 support person age 33 or older with them. For treatment visits, patients can not have anyone with them due to social distancing guidelines and our immunocompromised population.

## 2021-04-10 ENCOUNTER — Inpatient Hospital Stay (HOSPITAL_COMMUNITY): Payer: Medicare Other

## 2021-04-10 DIAGNOSIS — C9000 Multiple myeloma not having achieved remission: Secondary | ICD-10-CM | POA: Diagnosis not present

## 2021-04-10 DIAGNOSIS — D696 Thrombocytopenia, unspecified: Secondary | ICD-10-CM

## 2021-04-10 DIAGNOSIS — D539 Nutritional anemia, unspecified: Secondary | ICD-10-CM | POA: Diagnosis not present

## 2021-04-10 DIAGNOSIS — D649 Anemia, unspecified: Secondary | ICD-10-CM

## 2021-04-10 MED ORDER — DIPHENHYDRAMINE HCL 50 MG/ML IJ SOLN
25.0000 mg | Freq: Once | INTRAMUSCULAR | Status: DC
  Filled 2021-04-10: qty 1

## 2021-04-10 MED ORDER — ACETAMINOPHEN 325 MG PO TABS
650.0000 mg | ORAL_TABLET | Freq: Once | ORAL | Status: AC
  Administered 2021-04-10: 650 mg via ORAL
  Filled 2021-04-10: qty 2

## 2021-04-10 MED ORDER — SODIUM CHLORIDE 0.9% IV SOLUTION
250.0000 mL | Freq: Once | INTRAVENOUS | Status: AC
  Administered 2021-04-10: 250 mL via INTRAVENOUS

## 2021-04-10 MED ORDER — DIPHENHYDRAMINE HCL 25 MG PO CAPS
25.0000 mg | ORAL_CAPSULE | Freq: Once | ORAL | Status: AC
  Administered 2021-04-10: 25 mg via ORAL
  Filled 2021-04-10: qty 1

## 2021-04-10 NOTE — Progress Notes (Signed)
Pt presents today for 1 unit of blood per provider's order. Vital signs stable and pt voiced no new complaints at this time.   1 unit of given today per MD orders. Tolerated infusion without adverse affects. Vital signs stable. No complaints at this time. Discharged from clinic ambulatory in stable condition. Alert and oriented x 3. F/U with [facility] as scheduled.

## 2021-04-11 LAB — TYPE AND SCREEN
ABO/RH(D): O POS
Antibody Screen: NEGATIVE
Unit division: 0

## 2021-04-11 LAB — BPAM RBC
Blood Product Expiration Date: 202301062359
ISSUE DATE / TIME: 202212061217
Unit Type and Rh: 5100

## 2021-04-19 ENCOUNTER — Other Ambulatory Visit (HOSPITAL_COMMUNITY): Payer: Self-pay

## 2021-04-19 DIAGNOSIS — Z23 Encounter for immunization: Secondary | ICD-10-CM | POA: Diagnosis not present

## 2021-04-19 MED ORDER — LENALIDOMIDE 5 MG PO CAPS: 5.0000 mg | ORAL_CAPSULE | Freq: Every day | ORAL | 0 refills | Status: DC

## 2021-04-19 NOTE — Telephone Encounter (Signed)
Chart reviewed. Revlimid refilled per last office note with Dr. Katragadda.  

## 2021-04-24 DIAGNOSIS — R413 Other amnesia: Secondary | ICD-10-CM | POA: Diagnosis not present

## 2021-04-30 NOTE — Progress Notes (Signed)
Dale [name], [name] [id number]   CLINIC:  Medical Oncology/Hematology  PCP:  Dale [name], [place] / [name] [id number]  [phone]  REASON FOR VISIT:  Follow-up for multiple myeloma and anemia  PRIOR THERAPY: none  CURRENT THERAPY: surveillance  INTERVAL HISTORY:  [name], a 87 y.o. male, returns for routine follow-up for his multiple myeloma and anemia. Dale [name] was last seen on 04/09/2021.  Today Dale [name] reports feeling well. His week off of Revlimid started on 12/19 or 12/20, and Dale [name] is tolerating it well. Dale [name] reports good appetite. Dale [name] reports no change following receiving 1 unit PRBC. Dale [name] denies black stools. Dale [name] takes Decadron on Sundays. Dale [name] denies SOB, CP, and light-headedness.   REVIEW OF SYSTEMS:  Review of Systems  Constitutional:  Negative for appetite change (75%) and fatigue (75%).  HENT:   Positive for trouble swallowing.   Respiratory:  Negative for shortness of breath.   Cardiovascular:  Negative for chest pain.  Neurological:  Negative for light-headedness.  All other systems reviewed and are negative.  PAST MEDICAL/SURGICAL HISTORY:  Past Medical History:  Diagnosis Date   Allergic rhinitis    Anal fissure    Anginal pain (HCC)    Asthma    Cancer (HCC)    skin   Chest pain    CHF (congestive heart failure) (Dale [name])    Coronary heart disease    s/p stenting. cath in 01/2012 noncritical occlusion   Dysrhythmia    1st degree heart block   GERD (gastroesophageal reflux disease)    Glaucoma    Hiatal hernia    Hyperlipidemia    Hypertension    Hypothyroidism    Idiopathic thrombocytopenic purpura (Dale [name]) 2002   Macular degeneration    Nephrolithiasis    PUD (peptic ulcer disease)    remote   Sarcoidosis    pulmonary   Schatzki's ring    Past Surgical History:  Procedure Laterality Date   cardiac stents     COLONOSCOPY  10/30/2006   Normal rectum, sigmoid diverticula.Remainder of  colonic mucosa appeared normal.   CORONARY ANGIOPLASTY WITH STENT PLACEMENT     about 10 years ago per pt (around 2007)   Dale [name], URETEROSCOPY AND STENT PLACEMENT Left 06/16/2017   Procedure: CYSTOSCOPY WITH RETROGRADE PYELOGRAM, URETEROSCOPY,STONE EXTRACTION  AND STENT PLACEMENT;  Surgeon: Franchot Gallo, MD;  Location: [facility];  Service: Urology;  Laterality: Left;   ESOPHAGOGASTRODUODENOSCOPY  06/19/2004   Two esophageal rings and esophageal web as described above.  All of these were disrupted by passing 56-French Venia Minks dilator/ Candida esophagitis,which appears to be incidental given history of   antibiotic use, but nevertheless will be treated.   ESOPHAGOGASTRODUODENOSCOPY  10/30/2006   Distal tandem esophageal ring status post dilation disruption as  described above.  Otherwise normal esophagus/  Small hiatal hernia otherwise normal stomach, D1 and D2   ESOPHAGOGASTRODUODENOSCOPY N/A 03/22/2015   Dr.Rourk- noncritical schatzki's ring and hiatal hernia-o/w normal EGD.    ESOPHAGOGASTRODUODENOSCOPY (EGD) WITH ESOPHAGEAL DILATION  03/04/2012   RMR- schatzki's ring, hiatal hernia, polypoid gastric mucosa, bx= minimally active gastritis.   HOLMIUM LASER APPLICATION Left 2/63/3354   Procedure: HOLMIUM LASER APPLICATION;  Surgeon: Franchot Gallo, MD;  Location: [facility];  Service: Urology;  Laterality: Left;   IR GENERIC HISTORICAL  03/06/2016   IR RADIOLOGIST EVAL & MGMT 03/06/2016 Aletta Edouard, MD [facility] INTERV RAD   IR GENERIC HISTORICAL  06/18/2016   IR  RADIOLOGIST EVAL & MGMT 06/18/2016 Aletta Edouard, MD [facility] INTERV RAD   IR RADIOLOGIST EVAL & MGMT  10/01/2016   IR RADIOLOGIST EVAL & MGMT  10/15/2017   IR RADIOLOGIST EVAL & MGMT  12/24/2018   IR RADIOLOGIST EVAL & MGMT  01/04/2020   LEFT HEART CATH N/A 02/02/2012   Procedure: LEFT HEART CATH;  Surgeon: Lorretta Harp, MD;  Location: [facility] CATH LAB;  Service: Cardiovascular;  Laterality: N/A;    MEDIASTINOSCOPY     for dx sarcoid   RADIOLOGY WITH ANESTHESIA Left 05/17/2016   Procedure: left renal ablation;  Surgeon: Aletta Edouard, MD;  Location: [facility];  Service: Radiology;  Laterality: Left;    SOCIAL HISTORY:  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    Employer: RETIRED  Tobacco Use   Smoking status: Former    Packs/day: 0.10    Years: 2.00    Pack years: 0.20    Types: Cigarettes, Cigars    Quit date: 05/06/1970    Years since quitting: 51.0   Smokeless tobacco: Never  Vaping Use   Vaping Use: Never used  Substance and Sexual Activity   Alcohol use: No    Alcohol/week: 0.0 standard drinks   Drug use: No   Sexual activity: Never  Other Topics Concern   Not on file  Social History Narrative   Not on file   Social Determinants of Health   Financial Resource Strain: Not on file  Food Insecurity: Not on file  Transportation Needs: Not on file  Physical Activity: Not on file  Stress: Not on file  Social Connections: Not on file  Intimate Partner Violence: Not on file    FAMILY HISTORY:  Family History  Problem Relation Age of Onset   Heart disease Father        deceased age 75   Stroke Mother    Alzheimer's disease Mother    Heart attack Brother        deceased age 78   Cancer Other        niece   Colon cancer Neg Hx     CURRENT MEDICATIONS:  Current Outpatient Medications  Medication Sig Dispense Refill   aspirin 81 MG tablet Take 81 mg by mouth every Monday, Wednesday, and Friday.     atorvastatin (LIPITOR) 40 MG tablet TAKE 1 TABLET BY MOUTH IN  THE EVENING (Patient taking differently: Take 40 mg by mouth every evening.) 90 tablet 3   atropine 1 % ophthalmic solution Place one drop into the right eye 2 (two)  times daily for 14 days. Do not take tonight.  Dr. Ander Slade will evaluate your post-operative status tomorrow and may make changes to the duration and dose of this medication.     brimonidine (ALPHAGAN) 0.2 % ophthalmic solution Place 1 drop into both eyes 2 (two) times daily.     clopidogrel (PLAVIX) 75 MG tablet TAKE 1 TABLET BY MOUTH  DAILY 90 tablet 3   dexamethasone (DECADRON) 2 MG tablet Take 5 tablets (10 mg total) by mouth once a week. 20 tablet 3   dorzolamide-timolol (COSOPT) 22.3-6.8 MG/ML ophthalmic solution Place 1 drop into both eyes 2 (two) times daily.     fluocinonide  cream (LIDEX) 4.78 % Apply 1 application topically 2 (two) times daily.     furosemide (LASIX) 20 MG tablet If you notice any swelling, you may take 40 mg (two tablets) for the day. 90 tablet 3   isosorbide mononitrate (IMDUR) 60 MG 24 hr tablet Take 60 mg (one tablet) in the morning and 30 mg (one-half tablet) in the evening. 180 tablet 2   lenalidomide (REVLIMID) 5 MG capsule Take 1 capsule (5 mg total) by mouth daily. 14 days on, 7 days off 14 capsule 0   levothyroxine (SYNTHROID) 125 MCG tablet Take 100 mcg by mouth daily before breakfast.     losartan (COZAAR) 100 MG tablet TAKE 1 TABLET BY MOUTH  DAILY 90 tablet 2   metoprolol tartrate (LOPRESSOR) 25 MG tablet TAKE 1 TABLET BY MOUTH IN  THE MORNING AND 1/2 TABLET  IN THE EVENING (Patient taking differently: Take 12.5-25 mg by mouth See admin instructions. 25 mg in the morning  12.5 mg in the evening) 135 tablet 3   mometasone (ELOCON) 0.1 % ointment Apply 1 application topically daily.      Multiple Vitamins-Minerals (PRESERVISION/LUTEIN) CAPS Take 1 capsule by mouth 2 (two) times daily.     nitroGLYCERIN (NITROSTAT) 0.4 MG SL tablet Place 1 tablet (0.4 mg total) under the tongue every 5 (five) minutes as needed. For chest pain 25 tablet 2   pantoprazole (PROTONIX) 40 MG tablet TAKE 1 TABLET BY MOUTH  DAILY BEFORE BREAKFAST (Patient taking differently: Take 40 mg by  mouth daily.) 90 tablet 3   potassium chloride SA (KLOR-CON) 20 MEQ tablet Take 1 tablet (20 mEq total) by mouth daily. 30 tablet 0   prochlorperazine (COMPAZINE) 10 MG tablet Take 1 tablet (10 mg total) by mouth every 6 (six) hours as needed for nausea or vomiting. 30 tablet 3   ROCKLATAN 0.02-0.005 % SOLN Apply to eye.     triamcinolone (KENALOG) 0.1 % cream Apply 1 application topically 2 (two) times daily as needed (for irritation).     valACYclovir (VALTREX) 1000 MG tablet Take 1,000 mg by mouth 2 (two) times daily.     vitamin C (ASCORBIC ACID) 500 MG tablet Take 500 mg by mouth daily.     vitamin E 200 UNIT capsule Take 200 Units by mouth daily at 12 noon.      No current facility-administered medications for this visit.    ALLERGIES:  Allergies  Allergen Reactions   Azithromycin Other (See Comments)    Sore mouth and fever blisters around mouth, sores in nose area as well   Doxazosin Shortness Of Breath   Acetaminophen Other (See Comments)    REACTION: UNKNOWN REACTION   Atenolol Other (See Comments)    REACTION: UNKNOWN REACTION   Hydrocodone Nausea And Vomiting   Hydrocodone-Acetaminophen Nausea Only   Levofloxacin Other (See Comments)    Caused stomach problems.   Morphine Other (See Comments)    "made me crazy"   Penicillins Nausea And Vomiting and Other (See Comments)    Has patient had a PCN reaction causing immediate rash, facial/tongue/throat swelling, SOB or lightheadedness with hypotension: No Has patient had a PCN reaction causing severe rash involving mucus membranes or skin necrosis: No Has patient had a PCN reaction that required hospitalization No Has patient had a PCN reaction occurring within the last 10 years: No If all of the above answers are "NO", then may proceed with Cephalosporin use.    Sulfonamide Derivatives Nausea And Vomiting  PHYSICAL EXAM:  Performance status (ECOG): 0 - Asymptomatic  Vitals:   05/01/21 1447  BP: (!) 153/65  Pulse:  65  Resp: 20  Temp: 98 F (36.7 C)  SpO2: 100%   Wt Readings from Last 3 Encounters:  05/01/21 132 lb 11.5 oz (60.2 kg)  04/09/21 128 lb 12 oz (58.4 kg)  03/28/21 132 lb (59.9 kg)   Physical Exam Vitals reviewed.  Constitutional:      Appearance: Normal appearance.  Cardiovascular:     Rate and Rhythm: Normal rate and regular rhythm.     Pulses: Normal pulses.     Heart sounds: Normal heart sounds.  Pulmonary:     Effort: Pulmonary effort is normal.     Breath sounds: Normal breath sounds.  Musculoskeletal:     Right lower leg: 1+ Edema present.     Left lower leg: 1+ Edema present.  Neurological:     General: No focal deficit present.     Mental Status: Dale [name] is alert and oriented to person, place, and time.  Psychiatric:        Mood and Affect: Mood normal.        Behavior: Behavior normal.    LABORATORY DATA:  I have reviewed the labs as listed.  CBC Latest Ref Rng & Units 05/01/2021 04/09/2021 04/02/2021  WBC 4.0 - 10.5 K/uL 5.0 4.1 6.6  Hemoglobin 13.0 - 17.0 g/dL 7.2(L) 7.2(L) 7.2(L)  Hematocrit 39.0 - 52.0 % 23.1(L) 23.0(L) 22.0(L)  Platelets 150 - 400 K/uL 45(L) 96(L) 62(L)   CMP Latest Ref Rng & Units 05/01/2021 04/02/2021 03/08/2021  Glucose 70 - 99 mg/dL 117(H) 138(H) 102(H)  BUN 8 - 23 mg/dL 20 21 20   Creatinine 0.61 - 1.24 mg/dL 0.69 0.86 0.74  Sodium 135 - 145 mmol/L 133(L) 134(L) 135  Potassium 3.5 - 5.1 mmol/L 3.4(L) 3.4(L) 3.6  Chloride 98 - 111 mmol/L 104 104 105  CO2 22 - 32 mmol/L 24 22 26   Calcium 8.9 - 10.3 mg/dL 8.3(L) 8.8(L) 8.9  Total Protein 6.5 - 8.1 g/dL 6.9 7.2 7.3  Total Bilirubin 0.3 - 1.2 mg/dL 0.8 0.9 0.4  Alkaline Phos 38 - 126 U/L 84 116 82  AST 15 - 41 U/L 15 15 21   ALT 0 - 44 U/L 19 15 25       Component Value Date/Time   RBC 1.86 (L) 05/01/2021 1327   MCV 124.2 (H) 05/01/2021 1327   MCV 107 (H) 10/26/2019 1013   MCH 38.7 (H) 05/01/2021 1327   MCHC 31.2 05/01/2021 1327   RDW 18.6 (H) 05/01/2021 1327   RDW 12.7 10/26/2019  1013   LYMPHSABS 2.4 05/01/2021 1327   LYMPHSABS CANCELED 09/08/2015 0933   MONOABS 1.5 (H) 05/01/2021 1327   EOSABS 0.1 05/01/2021 1327   EOSABS CANCELED 09/08/2015 0933   BASOSABS 0.0 05/01/2021 1327   BASOSABS CANCELED 09/08/2015 0933    DIAGNOSTIC IMAGING:  I have independently reviewed the scans and discussed with the patient. No results found.   ASSESSMENT:  1.  IgG lambda plasma cell myeloma: - Work-up for macrocytic anemia on 12/01/2020 showed M spike of 2.9 g.  Immunofixation IgG lambda. - Lambda light chains elevated at 284.  Light chain ratio was 0.04.  LDH was 163.  Creatinine was 0.9 and calcium 8.9. - Bone marrow biopsy on 12/18/2020-hypercellular marrow for age with 48% atypical plasma cells with lambda light chain restriction. - Chromosome analysis was normal. - Myeloma FISH panel-loss of long-arm of chromosome 13, gain of  1q, t(14;16) - Skeletal survey was negative for lytic lesions. - Revlimid 5 mg, 2 weeks on/1 week off started on 01/20/2021.  Dexamethasone weekly 10 mg added on 02/07/2021.  2.  Macrocytic anemia: - CBC on 12/01/2020 with hemoglobin 8.5 and MCV of 117. - Denies any bleeding per rectum or melena.  3.  Social/family history: - Lives at home with his wife.  Dale [name] does all ADLs and IADLs.  Dale [name] even does yard work. - Dale [name] worked at [employer] for 35 years.  Denies any chemical exposure.  Non-smoker. - No family history of malignancies.   PLAN:  1.  IgG lambda plasma cell myeloma, high risk: - Myeloma panel from 04/02/2021 shows M spike is more or less stable at 2.1 g.  Lambda light chain slightly improved to 394 with ratio of 0.03. - We have reviewed labs from today which shows normal calcium and creatinine.  LFTs are normal with slightly low albumin 2.9.  Hemoglobin is 7.2 and platelet count is 45. - Dale [name] finished Revlimid on 04/23/2021. - Recommend holding Revlimid from starting today.  We will recheck his CBC next week.  Once his platelet count improves, we  will start him back on Revlimid 5 mg 2 weeks on/2 weeks off.  We will change his off time to 2 weeks as Dale [name] is requiring 2 weeks for the platelets to improve. - Dale [name] has gained 4 pounds and is eating well according to the son.  No further syncopal episodes. - Dale [name] will continue 10 mg dexamethasone on every Sunday. - RTC 5 weeks for follow-up with repeat labs.  2.  Macrocytic anemia: - Nutritional deficiency work-up was negative. - Last PRBC on 04/09/2021. - Hemoglobin today 7.2, partly from myelosuppression from Revlimid and partly from myeloma. - Dale [name] is not symptomatic at this time.  We will plan to repeat his CBC in with blood bank sample next week.  If it drops any further, will consider 1 unit PRBC.  3.  Thromboprophylaxis: - Dale [name] is continuing Plavix daily.  Continue aspirin 81 mg 3 times weekly.  4.  Right ankle swelling: - Dale [name] has 1+ edema in the lower extremities.  Continue Lasix most days of the week.  Orders placed this encounter:  Orders Placed This Encounter  Procedures   CBC with Differential/Platelet   Kappa/lambda light chains   Protein electrophoresis, serum   Comprehensive metabolic panel   Sample to Blood Bank     Derek Jack, MD [place] [phone]   I, Thana Ates, am acting as a scribe for Dr. Derek Jack.  I, Derek Jack MD, have reviewed the above documentation for accuracy and completeness, and I agree with the above.

## 2021-05-01 ENCOUNTER — Other Ambulatory Visit (HOSPITAL_COMMUNITY): Payer: Medicare Other

## 2021-05-01 ENCOUNTER — Ambulatory Visit (HOSPITAL_COMMUNITY): Payer: Medicare Other | Admitting: Hematology

## 2021-05-01 ENCOUNTER — Inpatient Hospital Stay (HOSPITAL_BASED_OUTPATIENT_CLINIC_OR_DEPARTMENT_OTHER): Payer: Medicare Other | Admitting: Hematology

## 2021-05-01 ENCOUNTER — Inpatient Hospital Stay (HOSPITAL_COMMUNITY): Payer: Medicare Other

## 2021-05-01 ENCOUNTER — Other Ambulatory Visit: Payer: Self-pay

## 2021-05-01 VITALS — BP 153/65 | HR 65 | Temp 98.0°F | Resp 20 | Ht 67.72 in | Wt 132.7 lb

## 2021-05-01 DIAGNOSIS — C9000 Multiple myeloma not having achieved remission: Secondary | ICD-10-CM

## 2021-05-01 DIAGNOSIS — D696 Thrombocytopenia, unspecified: Secondary | ICD-10-CM

## 2021-05-01 DIAGNOSIS — D539 Nutritional anemia, unspecified: Secondary | ICD-10-CM | POA: Diagnosis not present

## 2021-05-01 DIAGNOSIS — D649 Anemia, unspecified: Secondary | ICD-10-CM

## 2021-05-01 LAB — CBC WITH DIFFERENTIAL/PLATELET
Abs Immature Granulocytes: 0.03 10*3/uL (ref 0.00–0.07)
Basophils Absolute: 0 10*3/uL (ref 0.0–0.1)
Basophils Relative: 0 %
Eosinophils Absolute: 0.1 10*3/uL (ref 0.0–0.5)
Eosinophils Relative: 2 %
HCT: 23.1 % — ABNORMAL LOW (ref 39.0–52.0)
Hemoglobin: 7.2 g/dL — ABNORMAL LOW (ref 13.0–17.0)
Immature Granulocytes: 1 %
Lymphocytes Relative: 49 %
Lymphs Abs: 2.4 10*3/uL (ref 0.7–4.0)
MCH: 38.7 pg — ABNORMAL HIGH (ref 26.0–34.0)
MCHC: 31.2 g/dL (ref 30.0–36.0)
MCV: 124.2 fL — ABNORMAL HIGH (ref 80.0–100.0)
Monocytes Absolute: 1.5 10*3/uL — ABNORMAL HIGH (ref 0.1–1.0)
Monocytes Relative: 31 %
Neutro Abs: 0.9 10*3/uL — ABNORMAL LOW (ref 1.7–7.7)
Neutrophils Relative %: 17 %
Platelets: 45 10*3/uL — ABNORMAL LOW (ref 150–400)
RBC: 1.86 MIL/uL — ABNORMAL LOW (ref 4.22–5.81)
RDW: 18.6 % — ABNORMAL HIGH (ref 11.5–15.5)
WBC: 5 10*3/uL (ref 4.0–10.5)
nRBC: 0 % (ref 0.0–0.2)

## 2021-05-01 LAB — LACTATE DEHYDROGENASE: LDH: 115 U/L (ref 98–192)

## 2021-05-01 LAB — COMPREHENSIVE METABOLIC PANEL
ALT: 19 U/L (ref 0–44)
AST: 15 U/L (ref 15–41)
Albumin: 2.9 g/dL — ABNORMAL LOW (ref 3.5–5.0)
Alkaline Phosphatase: 84 U/L (ref 38–126)
Anion gap: 5 (ref 5–15)
BUN: 20 mg/dL (ref 8–23)
CO2: 24 mmol/L (ref 22–32)
Calcium: 8.3 mg/dL — ABNORMAL LOW (ref 8.9–10.3)
Chloride: 104 mmol/L (ref 98–111)
Creatinine, Ser: 0.69 mg/dL (ref 0.61–1.24)
GFR, Estimated: >60 mL/min (ref >60)
Glucose, Bld: 117 mg/dL — ABNORMAL HIGH (ref 70–99)
Potassium: 3.4 mmol/L — ABNORMAL LOW (ref 3.5–5.1)
Sodium: 133 mmol/L — ABNORMAL LOW (ref 135–145)
Total Bilirubin: 0.8 mg/dL (ref 0.3–1.2)
Total Protein: 6.9 g/dL (ref 6.5–8.1)

## 2021-05-01 NOTE — Patient Instructions (Addendum)
[place] at [facility] Discharge Instructions  You were seen and examined today by Dr. Delton Coombes. He reviewed your most recent labs and your hemoglobin is low. Dr. Delton Coombes is going to recheck your labs in 1 week to see how your levels are doing. Hold Revlimid until labs are improving. Continue taking Decadron on Sundays. Please keep follow up as scheduled in 5 weeks.   Thank you for choosing [place] at [facility] to provide your oncology and hematology care.  To afford each patient quality time with our provider, please arrive at least 15 minutes before your scheduled appointment time.   If you have a lab appointment with the [facility] please come in thru the Main Entrance and check in at the main information desk.  You need to re-schedule your appointment should you arrive 10 or more minutes late.  We strive to give you quality time with our providers, and arriving late affects you and other patients whose appointments are after yours.  Also, if you no show three or more times for appointments you may be dismissed from the clinic at the providers discretion.     Again, thank you for choosing [facility].  Our hope is that these requests will decrease the amount of time that you wait before being seen by our physicians.       _____________________________________________________________  Should you have questions after your visit to [facility], please contact our office at [phone] and follow the prompts.  Our office hours are 8:00 a.m. and 4:30 p.m. Monday - Friday.  Please note that voicemails left after 4:00 p.m. may not be returned until the following business day.  We are closed weekends and major holidays.  You do have access to a nurse 24-7, just call the main number to the clinic [phone] and do not press any options, hold on the line and a nurse will answer the phone.    For prescription refill  requests, have your pharmacy contact our office and allow 72 hours.    Due to Covid, you will need to wear a mask upon entering the hospital. If you do not have a mask, a mask will be given to you at the Main Entrance upon arrival. For doctor visits, patients may have 1 support person age 67 or older with them. For treatment visits, patients can not have anyone with them due to social distancing guidelines and our immunocompromised population.

## 2021-05-02 ENCOUNTER — Other Ambulatory Visit: Payer: Self-pay | Admitting: Cardiovascular Disease

## 2021-05-02 LAB — KAPPA/LAMBDA LIGHT CHAINS
Kappa free light chain: 11.6 mg/L (ref 3.3–19.4)
Kappa, lambda light chain ratio: 0.02 — ABNORMAL LOW (ref 0.26–1.65)
Lambda free light chains: 532.9 mg/L — ABNORMAL HIGH (ref 5.7–26.3)

## 2021-05-03 LAB — PROTEIN ELECTROPHORESIS, SERUM
A/G Ratio: 0.8 (ref 0.7–1.7)
Albumin ELP: 3 g/dL (ref 2.9–4.4)
Alpha-1-Globulin: 0.2 g/dL (ref 0.0–0.4)
Alpha-2-Globulin: 0.6 g/dL (ref 0.4–1.0)
Beta Globulin: 0.7 g/dL (ref 0.7–1.3)
Gamma Globulin: 2.2 g/dL — ABNORMAL HIGH (ref 0.4–1.8)
Globulin, Total: 3.7 g/dL (ref 2.2–3.9)
M-Spike, %: 2 g/dL — ABNORMAL HIGH
Total Protein ELP: 6.7 g/dL (ref 6.0–8.5)

## 2021-05-08 LAB — IMMUNOFIXATION ELECTROPHORESIS
IgA: 32 mg/dL — ABNORMAL LOW (ref 61–437)
IgG (Immunoglobin G), Serum: 2764 mg/dL — ABNORMAL HIGH (ref 603–1613)
IgM (Immunoglobulin M), Srm: 20 mg/dL (ref 15–143)
Total Protein ELP: 7.1 g/dL (ref 6.0–8.5)

## 2021-05-10 ENCOUNTER — Inpatient Hospital Stay (HOSPITAL_COMMUNITY): Payer: Medicare Other | Attending: Hematology

## 2021-05-10 ENCOUNTER — Ambulatory Visit (HOSPITAL_COMMUNITY)
Admission: RE | Admit: 2021-05-10 | Discharge: 2021-05-10 | Disposition: A | Discharge status: Home / Self Care | Payer: Medicare Other | Source: Ambulatory Visit | Attending: Cardiovascular Disease | Admitting: Cardiovascular Disease

## 2021-05-10 ENCOUNTER — Other Ambulatory Visit: Payer: Self-pay

## 2021-05-10 DIAGNOSIS — I25119 Atherosclerotic heart disease of native coronary artery with unspecified angina pectoris: Secondary | ICD-10-CM

## 2021-05-10 DIAGNOSIS — D649 Anemia, unspecified: Secondary | ICD-10-CM

## 2021-05-10 DIAGNOSIS — E785 Hyperlipidemia, unspecified: Secondary | ICD-10-CM | POA: Diagnosis not present

## 2021-05-10 DIAGNOSIS — D539 Nutritional anemia, unspecified: Secondary | ICD-10-CM | POA: Insufficient documentation

## 2021-05-10 DIAGNOSIS — D696 Thrombocytopenia, unspecified: Secondary | ICD-10-CM

## 2021-05-10 DIAGNOSIS — C9000 Multiple myeloma not having achieved remission: Secondary | ICD-10-CM

## 2021-05-10 LAB — ECHOCARDIOGRAM COMPLETE
AR max vel: 2.13 cm2
AV Area VTI: 2.32 cm2
AV Area mean vel: 2.2 cm2
AV Mean grad: 3.5 mmHg
AV Peak grad: 8 mmHg
Ao pk vel: 1.41 m/s
Area-P 1/2: 3.99 cm2
MV M vel: 5.65 m/s
MV Peak grad: 127.7 mmHg
Radius: 0.4 cm
S' Lateral: 2.5 cm

## 2021-05-10 LAB — SAMPLE TO BLOOD BANK

## 2021-05-10 LAB — PREPARE RBC (CROSSMATCH)

## 2021-05-10 NOTE — Progress Notes (Unsigned)
Called patient's home number to report HGB 7.2 today and assess if the patient is symptomatic and needing blood tomorrow. Infusion area phone number given to significant other to return call to [place] here at the clinic. Understanding verbalized.

## 2021-05-10 NOTE — Progress Notes (Signed)
*  PRELIMINARY RESULTS* Echocardiogram 2D Echocardiogram has been performed.  Dale [name] 05/10/2021, 11:30 AM

## 2021-05-11 ENCOUNTER — Inpatient Hospital Stay (HOSPITAL_COMMUNITY): Payer: Medicare Other

## 2021-05-11 DIAGNOSIS — D539 Nutritional anemia, unspecified: Secondary | ICD-10-CM | POA: Diagnosis not present

## 2021-05-11 DIAGNOSIS — C9000 Multiple myeloma not having achieved remission: Secondary | ICD-10-CM

## 2021-05-11 LAB — CBC WITH DIFFERENTIAL/PLATELET
Abs Immature Granulocytes: 0.06 10*3/uL (ref 0.00–0.07)
Basophils Absolute: 0 10*3/uL (ref 0.0–0.1)
Basophils Relative: 0 %
Eosinophils Absolute: 0.1 10*3/uL (ref 0.0–0.5)
Eosinophils Relative: 2 %
HCT: 24.3 % — ABNORMAL LOW (ref 39.0–52.0)
Hemoglobin: 7.7 g/dL — ABNORMAL LOW (ref 13.0–17.0)
Immature Granulocytes: 1 %
Lymphocytes Relative: 37 %
Lymphs Abs: 1.9 10*3/uL (ref 0.7–4.0)
MCH: 38.7 pg — ABNORMAL HIGH (ref 26.0–34.0)
MCHC: 31.7 g/dL (ref 30.0–36.0)
MCV: 122.1 fL — ABNORMAL HIGH (ref 80.0–100.0)
Monocytes Absolute: 1 10*3/uL (ref 0.1–1.0)
Monocytes Relative: 20 %
Neutro Abs: 2 10*3/uL (ref 1.7–7.7)
Neutrophils Relative %: 40 %
Platelets: 65 10*3/uL — ABNORMAL LOW (ref 150–400)
RBC: 1.99 MIL/uL — ABNORMAL LOW (ref 4.22–5.81)
RDW: 18.2 % — ABNORMAL HIGH (ref 11.5–15.5)
WBC Morphology: REACTIVE
WBC: 5.1 10*3/uL (ref 4.0–10.5)
nRBC: 0 % (ref 0.0–0.2)

## 2021-05-11 MED ORDER — DIPHENHYDRAMINE HCL 25 MG PO CAPS
25.0000 mg | ORAL_CAPSULE | Freq: Once | ORAL | Status: AC
  Administered 2021-05-11: 25 mg via ORAL
  Filled 2021-05-11: qty 1

## 2021-05-11 MED ORDER — ACETAMINOPHEN 325 MG PO TABS
650.0000 mg | ORAL_TABLET | Freq: Once | ORAL | Status: AC
  Administered 2021-05-11: 650 mg via ORAL
  Filled 2021-05-11: qty 2

## 2021-05-11 MED ORDER — SODIUM CHLORIDE 0.9% IV SOLUTION
250.0000 mL | Freq: Once | INTRAVENOUS | Status: AC
  Administered 2021-05-11: 250 mL via INTRAVENOUS

## 2021-05-11 NOTE — Patient Instructions (Signed)
[place]  Discharge Instructions: Thank you for choosing [facility] to provide your oncology and hematology care.  If you have a lab appointment with the [facility], please come in thru the Main Entrance and check in at the main information desk.  Wear comfortable clothing and clothing appropriate for easy access to any Portacath or PICC line.   We strive to give you quality time with your provider. You may need to reschedule your appointment if you arrive late (15 or more minutes).  Arriving late affects you and other patients whose appointments are after yours.  Also, if you miss three or more appointments without notifying the office, you may be dismissed from the clinic at the providers discretion.      For prescription refill requests, have your pharmacy contact our office and allow 72 hours for refills to be completed.    Today you received the following chemotherapy and/or immunotherapy agents UPRBC      To help prevent nausea and vomiting after your treatment, we encourage you to take your nausea medication as directed.  BELOW ARE SYMPTOMS THAT SHOULD BE REPORTED IMMEDIATELY: *FEVER GREATER THAN 100.4 F (38 C) OR HIGHER *CHILLS OR SWEATING *NAUSEA AND VOMITING THAT IS NOT CONTROLLED WITH YOUR NAUSEA MEDICATION *UNUSUAL SHORTNESS OF BREATH *UNUSUAL BRUISING OR BLEEDING *URINARY PROBLEMS (pain or burning when urinating, or frequent urination) *BOWEL PROBLEMS (unusual diarrhea, constipation, pain near the anus) TENDERNESS IN MOUTH AND THROAT WITH OR WITHOUT PRESENCE OF ULCERS (sore throat, sores in mouth, or a toothache) UNUSUAL RASH, SWELLING OR PAIN  UNUSUAL VAGINAL DISCHARGE OR ITCHING   Items with * indicate a potential emergency and should be followed up as soon as possible or go to the Emergency Department if any problems should occur.  Please show the CHEMOTHERAPY ALERT CARD or IMMUNOTHERAPY ALERT CARD at check-in to the Emergency Department  and triage nurse.  Should you have questions after your visit or need to cancel or reschedule your appointment, please contact [facility] [phone]  and follow the prompts.  Office hours are 8:00 a.m. to 4:30 p.m. Monday - Friday. Please note that voicemails left after 4:00 p.m. may not be returned until the following business day.  We are closed weekends and major holidays. You have access to a nurse at all times for urgent questions. Please call the main number to the clinic [phone] and follow the prompts.  For any non-urgent questions, you may also contact your provider using MyChart. We now offer e-Visits for anyone 23 and older to request care online for non-urgent symptoms. For details visit [email or web address].   Also download the MyChart app! Go to the app store, search "MyChart", open the app, select [place], and log in with your MyChart username and password.  Due to Covid, a mask is required upon entering the hospital/clinic. If you do not have a mask, one will be given to you upon arrival. For doctor visits, patients may have 1 support person aged 11 or older with them. For treatment visits, patients cannot have anyone with them due to current Covid guidelines and our immunocompromised population.

## 2021-05-11 NOTE — Progress Notes (Signed)
Patient presents today for one [facility] per providers order.  Vital signs WNL.  No new complaints at this time.  Peripheral IV started and blood return noted pre and post infusion.  PRBC infusion given today per MD orders.  Stable during infusion without adverse affects.  Vital signs stable.  No complaints at this time.  Discharge from clinic ambulatory in stable condition.  Alert and oriented X 3.  Follow up with [facility] as scheduled.

## 2021-05-13 LAB — TYPE AND SCREEN
ABO/RH(D): O POS
Antibody Screen: NEGATIVE
Unit division: 0

## 2021-05-13 LAB — BPAM RBC
Blood Product Expiration Date: 202302022359
ISSUE DATE / TIME: 202301061000
Unit Type and Rh: 5100

## 2021-05-16 DIAGNOSIS — H43813 Vitreous degeneration, bilateral: Secondary | ICD-10-CM | POA: Diagnosis not present

## 2021-05-16 DIAGNOSIS — H401121 Primary open-angle glaucoma, left eye, mild stage: Secondary | ICD-10-CM | POA: Diagnosis not present

## 2021-05-16 DIAGNOSIS — Z9889 Other specified postprocedural states: Secondary | ICD-10-CM | POA: Diagnosis not present

## 2021-05-16 DIAGNOSIS — H353132 Nonexudative age-related macular degeneration, bilateral, intermediate dry stage: Secondary | ICD-10-CM | POA: Diagnosis not present

## 2021-05-16 DIAGNOSIS — Z961 Presence of intraocular lens: Secondary | ICD-10-CM | POA: Diagnosis not present

## 2021-05-16 DIAGNOSIS — H401113 Primary open-angle glaucoma, right eye, severe stage: Secondary | ICD-10-CM | POA: Diagnosis not present

## 2021-05-16 DIAGNOSIS — H0102A Squamous blepharitis right eye, upper and lower eyelids: Secondary | ICD-10-CM | POA: Diagnosis not present

## 2021-05-16 DIAGNOSIS — H0102B Squamous blepharitis left eye, upper and lower eyelids: Secondary | ICD-10-CM | POA: Diagnosis not present

## 2021-05-16 DIAGNOSIS — H04123 Dry eye syndrome of bilateral lacrimal glands: Secondary | ICD-10-CM | POA: Diagnosis not present

## 2021-05-17 ENCOUNTER — Other Ambulatory Visit (HOSPITAL_COMMUNITY): Payer: Self-pay

## 2021-05-21 ENCOUNTER — Other Ambulatory Visit (HOSPITAL_COMMUNITY): Payer: Self-pay | Admitting: Hematology

## 2021-05-22 NOTE — Telephone Encounter (Signed)
Chart reviewed. Revlimid refilled per verbal order from Dr. Delton Coombes. Patient will now take Revlimid 5mg  for 14 days on followed by 14 days off.

## 2021-05-23 ENCOUNTER — Other Ambulatory Visit: Payer: Self-pay

## 2021-05-23 ENCOUNTER — Emergency Department (HOSPITAL_COMMUNITY)
Admission: EM | Admit: 2021-05-23 | Discharge: 2021-05-23 | Disposition: A | Discharge status: Home / Self Care | Payer: Medicare Other | Attending: Emergency Medicine | Admitting: Emergency Medicine

## 2021-05-23 ENCOUNTER — Emergency Department (HOSPITAL_COMMUNITY): Payer: Medicare Other

## 2021-05-23 ENCOUNTER — Encounter (HOSPITAL_COMMUNITY): Payer: Self-pay

## 2021-05-23 DIAGNOSIS — D649 Anemia, unspecified: Secondary | ICD-10-CM | POA: Insufficient documentation

## 2021-05-23 DIAGNOSIS — I110 Hypertensive heart disease with heart failure: Secondary | ICD-10-CM | POA: Insufficient documentation

## 2021-05-23 DIAGNOSIS — R112 Nausea with vomiting, unspecified: Secondary | ICD-10-CM | POA: Insufficient documentation

## 2021-05-23 DIAGNOSIS — Z955 Presence of coronary angioplasty implant and graft: Secondary | ICD-10-CM | POA: Insufficient documentation

## 2021-05-23 DIAGNOSIS — Z79899 Other long term (current) drug therapy: Secondary | ICD-10-CM | POA: Insufficient documentation

## 2021-05-23 DIAGNOSIS — I2510 Atherosclerotic heart disease of native coronary artery without angina pectoris: Secondary | ICD-10-CM | POA: Insufficient documentation

## 2021-05-23 DIAGNOSIS — I10 Essential (primary) hypertension: Secondary | ICD-10-CM | POA: Diagnosis not present

## 2021-05-23 DIAGNOSIS — R0902 Hypoxemia: Secondary | ICD-10-CM | POA: Diagnosis not present

## 2021-05-23 DIAGNOSIS — R1111 Vomiting without nausea: Secondary | ICD-10-CM | POA: Diagnosis not present

## 2021-05-23 DIAGNOSIS — Z7902 Long term (current) use of antithrombotics/antiplatelets: Secondary | ICD-10-CM | POA: Insufficient documentation

## 2021-05-23 DIAGNOSIS — Z7982 Long term (current) use of aspirin: Secondary | ICD-10-CM | POA: Insufficient documentation

## 2021-05-23 DIAGNOSIS — I509 Heart failure, unspecified: Secondary | ICD-10-CM | POA: Insufficient documentation

## 2021-05-23 DIAGNOSIS — Z743 Need for continuous supervision: Secondary | ICD-10-CM | POA: Diagnosis not present

## 2021-05-23 LAB — CBC WITH DIFFERENTIAL/PLATELET
Basophils Absolute: 0 10*3/uL (ref 0.0–0.1)
Basophils Relative: 0 %
Blasts: 1 %
Eosinophils Absolute: 0.1 10*3/uL (ref 0.0–0.5)
Eosinophils Relative: 2 %
HCT: 21.4 % — ABNORMAL LOW (ref 39.0–52.0)
Hemoglobin: 7 g/dL — ABNORMAL LOW (ref 13.0–17.0)
Lymphocytes Relative: 42 %
Lymphs Abs: 1.5 10*3/uL (ref 0.7–4.0)
MCH: 38.9 pg — ABNORMAL HIGH (ref 26.0–34.0)
MCHC: 32.7 g/dL (ref 30.0–36.0)
MCV: 118.9 fL — ABNORMAL HIGH (ref 80.0–100.0)
Monocytes Absolute: 0.6 10*3/uL (ref 0.1–1.0)
Monocytes Relative: 16 %
Myelocytes: 1 %
Neutro Abs: 1.3 10*3/uL — ABNORMAL LOW (ref 1.7–7.7)
Neutrophils Relative %: 37 %
Platelets: 89 10*3/uL — ABNORMAL LOW (ref 150–400)
Promyelocytes Relative: 1 %
RBC: 1.8 MIL/uL — ABNORMAL LOW (ref 4.22–5.81)
RDW: 17.4 % — ABNORMAL HIGH (ref 11.5–15.5)
WBC: 3.6 10*3/uL — ABNORMAL LOW (ref 4.0–10.5)
nRBC: 0 % (ref 0.0–0.2)

## 2021-05-23 LAB — TROPONIN I (HIGH SENSITIVITY): Troponin I (High Sensitivity): 5 ng/L (ref <18)

## 2021-05-23 LAB — URINALYSIS, ROUTINE W REFLEX MICROSCOPIC
Bilirubin Urine: NEGATIVE
Glucose, UA: NEGATIVE mg/dL
Hgb urine dipstick: NEGATIVE
Ketones, ur: NEGATIVE mg/dL
Leukocytes,Ua: NEGATIVE
Nitrite: NEGATIVE
Protein, ur: NEGATIVE mg/dL
Specific Gravity, Urine: >1.03 — ABNORMAL HIGH (ref 1.005–1.030)
pH: 6 (ref 5.0–8.0)

## 2021-05-23 LAB — COMPREHENSIVE METABOLIC PANEL
ALT: 21 U/L (ref 0–44)
AST: 19 U/L (ref 15–41)
Albumin: 2.8 g/dL — ABNORMAL LOW (ref 3.5–5.0)
Alkaline Phosphatase: 76 U/L (ref 38–126)
Anion gap: 7 (ref 5–15)
BUN: 23 mg/dL (ref 8–23)
CO2: 23 mmol/L (ref 22–32)
Calcium: 9 mg/dL (ref 8.9–10.3)
Chloride: 106 mmol/L (ref 98–111)
Creatinine, Ser: 0.74 mg/dL (ref 0.61–1.24)
GFR, Estimated: >60 mL/min (ref >60)
Glucose, Bld: 122 mg/dL — ABNORMAL HIGH (ref 70–99)
Potassium: 3.7 mmol/L (ref 3.5–5.1)
Sodium: 136 mmol/L (ref 135–145)
Total Bilirubin: 0.9 mg/dL (ref 0.3–1.2)
Total Protein: 8.6 g/dL — ABNORMAL HIGH (ref 6.5–8.1)

## 2021-05-23 LAB — PREPARE RBC (CROSSMATCH)

## 2021-05-23 LAB — LIPASE, BLOOD: Lipase: 26 U/L (ref 11–51)

## 2021-05-23 MED ORDER — METOCLOPRAMIDE HCL 10 MG PO TABS: 10.0000 mg | ORAL_TABLET | Freq: Four times a day (QID) | ORAL | 0 refills | Status: DC | PRN

## 2021-05-23 MED ORDER — SODIUM CHLORIDE 0.9 % IV SOLN
10.0000 mL/h | Freq: Once | INTRAVENOUS | Status: AC
  Administered 2021-05-23: 10 mL/h via INTRAVENOUS

## 2021-05-23 MED ORDER — ONDANSETRON HCL 4 MG/2ML IJ SOLN
4.0000 mg | Freq: Once | INTRAMUSCULAR | Status: AC
Start: 2021-05-23 — End: 2021-05-23
  Administered 2021-05-23: 4 mg via INTRAVENOUS
  Filled 2021-05-23: qty 2

## 2021-05-23 MED ORDER — METOCLOPRAMIDE HCL 5 MG/ML IJ SOLN
10.0000 mg | Freq: Once | INTRAMUSCULAR | Status: AC
Start: 2021-05-23 — End: 2021-05-23
  Administered 2021-05-23: 10 mg via INTRAVENOUS
  Filled 2021-05-23: qty 2

## 2021-05-23 NOTE — ED Triage Notes (Signed)
Patient via EMS from home for vomiting for the past day. Patient cool and clammy with history of stents.

## 2021-05-23 NOTE — ED Notes (Signed)
Per Olga Millers, patient given crackers and drink.

## 2021-05-23 NOTE — ED Provider Notes (Signed)
[facility] EMERGENCY DEPARTMENT Provider Note   CSN: [id number] Arrival date & time: 05/23/21  1009     History  Chief Complaint  Patient presents with   Emesis    Dale [name] is a 88 y.o. male with a history most significant for CAD with history of stenting, hypertension, CHF, history of Schatzki's ring and GERD, currently under the care of Dr. Delton Coombes for multiple myeloma not currently receiving chemotherapy treatment presenting with a 1 day history of nausea and nonbloody vomiting, describing yellow liquid vomitus.  He also endorses generalized abdominal discomfort but denies overt or localizing pain.  He has had no abdominal distention, no diarrhea, he denies fevers or chills.  His last bowel movement was yesterday morning and normal.  He states he ate fish for dinner last night and shortly after this meal the nausea and vomiting started.  He has had no treatment prior to arrival, he had multiple episodes of vomiting this morning while being transported here per EMS.  The history is provided by the patient.      Home Medications Prior to Admission medications   Medication Sig Start Date End Date Taking? Authorizing Provider  aspirin 81 MG tablet Take 81 mg by mouth every Monday, Wednesday, and Friday.   Yes [provider]  atorvastatin (LIPITOR) 40 MG tablet TAKE 1 TABLET BY MOUTH IN  THE EVENING Patient taking differently: Take 40 mg by mouth every evening. 10/23/20  Yes Troy Sine, MD  brimonidine (ALPHAGAN) 0.2 % ophthalmic solution Place 1 drop into both eyes 2 (two) times daily.   Yes [provider]  clopidogrel (PLAVIX) 75 MG tablet TAKE 1 TABLET BY MOUTH  DAILY 02/16/21  Yes Troy Sine, MD  dexamethasone (DECADRON) 2 MG tablet Take 5 tablets (10 mg total) by mouth once a week. 02/07/21  Yes Derek Jack, MD  dorzolamide-timolol (COSOPT) 22.3-6.8 MG/ML ophthalmic solution Place 1 drop into both eyes 2 (two) times daily.   Yes  [provider]  furosemide (LASIX) 20 MG tablet If you notice any swelling, you may take 40 mg (two tablets) for the day. Patient taking differently: 40 mg daily as needed for fluid. 03/28/21  Yes Troy Sine, MD  isosorbide mononitrate (IMDUR) 60 MG 24 hr tablet Take 60 mg (one tablet) in the morning and 30 mg (one-half tablet) in the evening. 03/28/21  Yes Troy Sine, MD  lenalidomide (REVLIMID) 5 MG capsule Take 1 capsule (5 mg total) by mouth daily. 14 days on, 14 days off 05/22/21  Yes Derek Jack, MD  levothyroxine (SYNTHROID) 100 MCG tablet Take 100 mcg by mouth daily before breakfast.   Yes [provider]  losartan (COZAAR) 100 MG tablet TAKE 1 TABLET BY MOUTH  DAILY 01/15/21  Yes Troy Sine, MD  metoCLOPramide (REGLAN) 10 MG tablet Take 1 tablet (10 mg total) by mouth every 6 (six) hours as needed for nausea or vomiting. 05/23/21  Yes Bristyl Mclees, Almyra Free, PA-C  metoprolol tartrate (LOPRESSOR) 25 MG tablet TAKE 1 TABLET BY MOUTH IN  THE MORNING AND 1/2 TABLET  IN THE EVENING Patient taking differently: Take 12.5-25 mg by mouth See admin instructions. 25 mg in the morning  12.5 mg in the evening 08/14/20  Yes Troy Sine, MD  mometasone (ELOCON) 0.1 % ointment Apply 1 application topically daily.    Yes [provider]  Multiple Vitamins-Minerals (PRESERVISION/LUTEIN) CAPS Take 1 capsule by mouth 2 (two) times daily.   Yes [provider]  nitroGLYCERIN (NITROSTAT) 0.4 MG SL tablet Place 1 tablet (0.4 mg total) under the tongue every 5 (five) minutes as needed. For chest pain 09/27/20  Yes Troy Sine, MD  pantoprazole (PROTONIX) 40 MG tablet TAKE 1 TABLET BY MOUTH  DAILY BEFORE BREAKFAST Patient taking differently: Take 40 mg by mouth daily. 08/18/20  Yes Mahala Menghini, PA-C  potassium chloride SA (KLOR-CON) 20 MEQ tablet Take 1 tablet (20 mEq total) by mouth daily. 09/30/20  Yes Dunn, Dayna N, PA-C  prochlorperazine (COMPAZINE) 10 MG  tablet Take 1 tablet (10 mg total) by mouth every 6 (six) hours as needed for nausea or vomiting. 01/15/21  Yes Derek Jack, MD  ROCKLATAN 0.02-0.005 % SOLN Apply 1 drop to eye at bedtime. 12/21/19  Yes [provider]  triamcinolone (KENALOG) 0.1 % cream Apply 1 application topically 2 (two) times daily as needed (for irritation).   Yes [provider]  vitamin C (ASCORBIC ACID) 500 MG tablet Take 500 mg by mouth daily.   Yes [provider]  vitamin E 200 UNIT capsule Take 200 Units by mouth daily at 12 noon.    Yes [provider]  atropine 1 % ophthalmic solution Place one drop into the right eye 2 (two) times daily for 14 days. Do not take tonight.  Dr. Ander Slade will evaluate your post-operative status tomorrow and may make changes to the duration and dose of this medication. Patient not taking: Reported on 05/23/2021 01/10/21   [provider]  fluocinonide cream (LIDEX) 0.34 % Apply 1 application topically 2 (two) times daily. Patient not taking: Reported on 05/23/2021 01/05/21   [provider]      Allergies    Azithromycin, Doxazosin, Acetaminophen, Atenolol, Hydrocodone, Hydrocodone-acetaminophen, Levofloxacin, Morphine, Penicillins, and Sulfonamide derivatives    Review of Systems   Review of Systems  Constitutional:  Negative for chills and fever.  HENT:  Negative for congestion and sore throat.   Eyes: Negative.   Respiratory:  Negative for chest tightness and shortness of breath.   Cardiovascular:  Negative for chest pain.  Gastrointestinal:  Positive for nausea and vomiting. Negative for abdominal pain and diarrhea.  Genitourinary: Negative.   Musculoskeletal:  Negative for arthralgias, joint swelling and neck pain.  Skin: Negative.  Negative for rash and wound.  Neurological:  Negative for dizziness, weakness, light-headedness, numbness and headaches.  Psychiatric/Behavioral: Negative.    All other systems reviewed and are  negative.  Physical Exam Updated Vital Signs BP (!) 173/65    Pulse 75    Temp 98.6 F (37 C) (Oral)    Resp 12    Ht _0  (1.702 m)    Wt 59 kg    SpO2 100%    BMI 20.36 kg/m  Physical Exam Vitals and nursing note reviewed.  Constitutional:      Appearance: He is well-developed and underweight.  HENT:     Head: Normocephalic and atraumatic.  Cardiovascular:     Rate and Rhythm: Normal rate and regular rhythm.     Heart sounds: Normal heart sounds.  Pulmonary:     Effort: Pulmonary effort is normal.     Breath sounds: Normal breath sounds. No wheezing.  Abdominal:     General: Bowel sounds are normal. There is no distension.     Palpations: Abdomen is soft.     Tenderness: There is no abdominal tenderness. There is no guarding.  Musculoskeletal:        General: Normal  range of motion.     Cervical back: Normal range of motion.     Right lower leg: No edema.     Left lower leg: No edema.  Skin:    General: Skin is warm and dry.     Coloration: Skin is pale.  Neurological:     Mental Status: He is alert.    ED Results / Procedures / Treatments   Labs (all labs ordered are listed, but only abnormal results are displayed) Labs Reviewed  COMPREHENSIVE METABOLIC PANEL - Abnormal; Notable for the following components:      Result Value   Glucose, Bld 122 (*)    Total Protein 8.6 (*)    Albumin 2.8 (*)    All other components within normal limits  URINALYSIS, ROUTINE W REFLEX MICROSCOPIC - Abnormal; Notable for the following components:   Color, Urine STRAW (*)    Specific Gravity, Urine >1.030 (*)    All other components within normal limits  CBC WITH DIFFERENTIAL/PLATELET - Abnormal; Notable for the following components:   WBC 3.6 (*)    RBC 1.80 (*)    Hemoglobin 7.0 (*)    HCT 21.4 (*)    MCV 118.9 (*)    MCH 38.9 (*)    RDW 17.4 (*)    Platelets 89 (*)    Neutro Abs 1.3 (*)    All other components within normal limits  LIPASE, BLOOD  PATHOLOGIST SMEAR REVIEW   TYPE AND SCREEN  PREPARE RBC (CROSSMATCH)  TROPONIN I (HIGH SENSITIVITY)    EKG EKG Interpretation  Date/Time:  Wednesday May 23 2021 17:58:58 EST Ventricular Rate:  83 PR Interval:  276 QRS Duration: 95 QT Interval:  386 QTC Calculation: 454 R Axis:   81 Text Interpretation: Sinus rhythm Prolonged PR interval Baseline wander Confirmed by Lajean Saver [phone]) on 05/23/2021 6:38:22 PM  Radiology DG ABD ACUTE 2+V W 1V CHEST  Result Date: 05/23/2021 CLINICAL DATA:  Nausea and vomiting EXAM: DG ABDOMEN ACUTE WITH 1 VIEW CHEST COMPARISON:  Chest 01/04/2021 FINDINGS: Cardiac and mediastinal contours normal. Lungs are clear without infiltrate or effusion. Normal bowel gas pattern. No obstruction or free air. Negative for urinary tract calculi. IMPRESSION: Negative abdominal radiographs.  No acute cardiopulmonary disease. Electronically Signed   By: Franchot Gallo M.D.   On: 05/23/2021 12:48    Procedures Procedures    Medications Ordered in ED Medications  ondansetron (ZOFRAN) injection 4 mg (4 mg Intravenous Given 05/23/21 1125)  metoCLOPramide (REGLAN) injection 10 mg (10 mg Intravenous Given 05/23/21 1330)  0.9 %  sodium chloride infusion (10 mL/hr Intravenous New Bag/Given 05/23/21 1812)    ED Course/ Medical Decision Making/ A&P                           Medical Decision Making Patient presented with a 1 day history of nausea and vomiting, no abdominal pain, no fevers or chills, no diarrhea.  Currently under the care of Dr. Delton Coombes for multiple myeloma with recent blood transfusion secondary to anemia.  Amount and/or Complexity of Data Reviewed External Data Reviewed: labs.    Details: Prior labs from the heme-onc clinic reviewed, patient with persistent anemia despite blood transfusion on January 10. Labs: ordered.    Details: Labs today are significant for blood glucose level of 122, he has a low albumin at 2.8.  His UA is unremarkable except for an elevated specific  gravity suggesting dehydration.  He was given  IV fluids while here.  He has a mild leukopenia at 3.6, this is stable and consistent with his chronic disease.  His hemoglobin is 7.0 today.  This is a macrocytic anemia, patient denies hematemesis, no diarrhea or rectal bleeding.  Platelets are 89 and also stable for his condition.  Lipase is negative, LFTs are negative. Radiology: ordered and independent interpretation performed.    Details: Acute abdominal series showing a normal bowel gas pattern, no evidence of obstruction or free air.  Lungs are clear. ECG/medicine tests: ordered. Discussion of management or test interpretation with external provider(s): Discussed patient's presentation and lab findings with Dr. Delton Coombes.  He did advise patient receiving 1 unit of packed red blood cells while here and then can be discharged charged home as his other labs are stable.  Of note he was also given Reglan after Zofran was not effective after which she was able to tolerate p.o. intake without difficulty.  He continues to have no complaints of abdominal pain.  Risk Decision regarding hospitalization. Risk Details: Patient is felt stable for discharge home after he is received 1 unit of packed RBCs.  He will be prescribed Reglan for home use if he has return of his nausea.  Plan close follow-up with Dr. Delton Coombes.   Pt discussed with Etta Quill NP who will dc pt once blood transfusion is complete.        Final Clinical Impression(s) / ED Diagnoses Final diagnoses:  Nausea and vomiting, unspecified vomiting type  Anemia, unspecified type    Rx / DC Orders ED Discharge Orders          Ordered    metoCLOPramide (REGLAN) 10 MG tablet  Every 6 hours PRN        05/23/21 1713              Evalee Jefferson, PA-C 05/23/21 1846    Milton Ferguson, MD 05/24/21 1053

## 2021-05-23 NOTE — Discharge Instructions (Signed)
You have been prescribed some nausea medicine if the symptom returns.  Make sure you are drinking plenty of fluids to avoid dehydration.  Plan follow-up care with your primary doctor and Dr. Delton Coombes as scheduled.  Return here if you have any return or worsening of your symptoms.

## 2021-05-23 NOTE — ED Provider Notes (Signed)
Patient received in sign out from Evalee Jefferson, PA-C at shift change. Patient has been evaluated for nausea, and vomiting x 1 day. Labs are reassuring, at baseline other than chronic anemia requiring blood transfusions. Patient received one unit of PRBC's today.   On reassessment, patient is tolerating oral intake. Diffuse, non-localized abdominal discomfort.  Patient is nontoxic, nonseptic appearing, in no apparent distress.  Patient's adequately managed in emergency department.  Fluid bolus given.  Labs and vitals reviewed.  Patient does not meet the SIRS or Sepsis criteria.  On repeat exam patient does not have a surgical abdomen and there are no peritoneal signs.  Patient discharged home with symptomatic treatment and given strict instructions for follow-up with their primary care physician.  Return precautions in discharge instructions.  Patient expresses understanding and agrees with plan.     Etta Quill, NP 05/23/21 2134    Lajean Saver, MD 05/23/21 2256

## 2021-05-24 LAB — TYPE AND SCREEN
ABO/RH(D): O POS
Antibody Screen: NEGATIVE
Unit division: 0

## 2021-05-24 LAB — BPAM RBC
Blood Product Expiration Date: 202302212359
ISSUE DATE / TIME: 202301181802
Unit Type and Rh: 5100

## 2021-05-24 LAB — PATHOLOGIST SMEAR REVIEW

## 2021-05-26 ENCOUNTER — Other Ambulatory Visit: Payer: Self-pay | Admitting: Cardiovascular Disease

## 2021-05-28 ENCOUNTER — Encounter (HOSPITAL_COMMUNITY): Payer: Self-pay

## 2021-06-05 DIAGNOSIS — T50905A Adverse effect of unspecified drugs, medicaments and biological substances, initial encounter: Secondary | ICD-10-CM | POA: Diagnosis not present

## 2021-06-05 DIAGNOSIS — E063 Autoimmune thyroiditis: Secondary | ICD-10-CM | POA: Diagnosis not present

## 2021-06-05 DIAGNOSIS — Z681 Body mass index (BMI) 19 or less, adult: Secondary | ICD-10-CM | POA: Diagnosis not present

## 2021-06-05 DIAGNOSIS — K922 Gastrointestinal hemorrhage, unspecified: Secondary | ICD-10-CM | POA: Diagnosis not present

## 2021-06-05 DIAGNOSIS — I10 Essential (primary) hypertension: Secondary | ICD-10-CM | POA: Diagnosis not present

## 2021-06-05 DIAGNOSIS — M1991 Primary osteoarthritis, unspecified site: Secondary | ICD-10-CM | POA: Diagnosis not present

## 2021-06-07 ENCOUNTER — Inpatient Hospital Stay (HOSPITAL_COMMUNITY): Payer: Medicare Other | Attending: Hematology | Admitting: Hematology

## 2021-06-07 ENCOUNTER — Other Ambulatory Visit: Payer: Self-pay

## 2021-06-07 ENCOUNTER — Encounter (HOSPITAL_COMMUNITY): Payer: Self-pay | Admitting: Hematology

## 2021-06-07 ENCOUNTER — Inpatient Hospital Stay (HOSPITAL_COMMUNITY): Payer: Medicare Other

## 2021-06-07 VITALS — BP 145/56 | HR 77 | Temp 97.8°F | Resp 16 | Ht 68.0 in | Wt 129.5 lb

## 2021-06-07 DIAGNOSIS — D693 Immune thrombocytopenic purpura: Secondary | ICD-10-CM | POA: Insufficient documentation

## 2021-06-07 DIAGNOSIS — D630 Anemia in neoplastic disease: Secondary | ICD-10-CM | POA: Insufficient documentation

## 2021-06-07 DIAGNOSIS — D649 Anemia, unspecified: Secondary | ICD-10-CM

## 2021-06-07 DIAGNOSIS — Z79899 Other long term (current) drug therapy: Secondary | ICD-10-CM | POA: Diagnosis not present

## 2021-06-07 DIAGNOSIS — D696 Thrombocytopenia, unspecified: Secondary | ICD-10-CM

## 2021-06-07 DIAGNOSIS — Z5112 Encounter for antineoplastic immunotherapy: Secondary | ICD-10-CM | POA: Diagnosis not present

## 2021-06-07 DIAGNOSIS — C9000 Multiple myeloma not having achieved remission: Secondary | ICD-10-CM | POA: Insufficient documentation

## 2021-06-07 LAB — CBC WITH DIFFERENTIAL/PLATELET
Abs Immature Granulocytes: 0.02 10*3/uL (ref 0.00–0.07)
Basophils Absolute: 0 10*3/uL (ref 0.0–0.1)
Basophils Relative: 0 %
Eosinophils Absolute: 0.4 10*3/uL (ref 0.0–0.5)
Eosinophils Relative: 8 %
HCT: 18.9 % — ABNORMAL LOW (ref 39.0–52.0)
Hemoglobin: 6.1 g/dL — CL (ref 13.0–17.0)
Immature Granulocytes: 0 %
Lymphocytes Relative: 27 %
Lymphs Abs: 1.2 10*3/uL (ref 0.7–4.0)
MCH: 38.1 pg — ABNORMAL HIGH (ref 26.0–34.0)
MCHC: 32.3 g/dL (ref 30.0–36.0)
MCV: 118.1 fL — ABNORMAL HIGH (ref 80.0–100.0)
Monocytes Absolute: 0.5 10*3/uL (ref 0.1–1.0)
Monocytes Relative: 12 %
Neutro Abs: 2.4 10*3/uL (ref 1.7–7.7)
Neutrophils Relative %: 53 %
Platelets: 70 10*3/uL — ABNORMAL LOW (ref 150–400)
RBC: 1.6 MIL/uL — ABNORMAL LOW (ref 4.22–5.81)
RDW: 21.8 % — ABNORMAL HIGH (ref 11.5–15.5)
WBC: 4.5 10*3/uL (ref 4.0–10.5)
nRBC: 0 % (ref 0.0–0.2)

## 2021-06-07 LAB — COMPREHENSIVE METABOLIC PANEL
ALT: 17 U/L (ref 0–44)
AST: 17 U/L (ref 15–41)
Albumin: 2.7 g/dL — ABNORMAL LOW (ref 3.5–5.0)
Alkaline Phosphatase: 80 U/L (ref 38–126)
Anion gap: 3 — ABNORMAL LOW (ref 5–15)
BUN: 22 mg/dL (ref 8–23)
CO2: 24 mmol/L (ref 22–32)
Calcium: 8.7 mg/dL — ABNORMAL LOW (ref 8.9–10.3)
Chloride: 105 mmol/L (ref 98–111)
Creatinine, Ser: 0.85 mg/dL (ref 0.61–1.24)
GFR, Estimated: >60 mL/min (ref >60)
Glucose, Bld: 126 mg/dL — ABNORMAL HIGH (ref 70–99)
Potassium: 3.7 mmol/L (ref 3.5–5.1)
Sodium: 132 mmol/L — ABNORMAL LOW (ref 135–145)
Total Bilirubin: 0.5 mg/dL (ref 0.3–1.2)
Total Protein: 7.6 g/dL (ref 6.5–8.1)

## 2021-06-07 LAB — SAMPLE TO BLOOD BANK

## 2021-06-07 LAB — PREPARE RBC (CROSSMATCH)

## 2021-06-07 NOTE — Progress Notes (Signed)
[place], [place] [address]   CLINIC:  Medical Oncology/Hematology  PCP:  [facility], [place] / [place] [address]  [phone]  REASON FOR VISIT:  Follow-up for multiple myeloma and anemia  PRIOR THERAPY: none  CURRENT THERAPY: surveillance  INTERVAL HISTORY:  [name], a 88 y.o. male, returns for routine follow-up for his multiple myeloma and anemia. Faron was last seen on 05/01/2021.  Today he reports feeling good, and today he is accompanied by his son. He he took 1 tablet of  Revlimid on 12/07 then started back on Revlimid on 12/23. He reports intermittent hematochezia; the last episode of which was on 12/25, and he stopped Plavix today.    REVIEW OF SYSTEMS:  Review of Systems  Constitutional:  Negative for appetite change and fatigue.  Gastrointestinal:  Positive for blood in stool (rectal bleeding).  All other systems reviewed and are negative.  PAST MEDICAL/SURGICAL HISTORY:  Past Medical History:  Diagnosis Date   Allergic rhinitis    Anal fissure    Anginal pain (HCC)    Asthma    Cancer (HCC)    skin   Chest pain    CHF (congestive heart failure) ([place])    Coronary heart disease    s/p stenting. cath in 01/2012 noncritical occlusion   Dysrhythmia    1st degree heart block   GERD (gastroesophageal reflux disease)    Glaucoma    Hiatal hernia    Hyperlipidemia    Hypertension    Hypothyroidism    Idiopathic thrombocytopenic purpura ([place]) 2002   Macular degeneration    Nephrolithiasis    PUD (peptic ulcer disease)    remote   Sarcoidosis    pulmonary   Schatzki's ring    Past Surgical History:  Procedure Laterality Date   cardiac stents     COLONOSCOPY  10/30/2006   Normal rectum, sigmoid diverticula.Remainder of colonic mucosa appeared normal.   CORONARY ANGIOPLASTY WITH STENT PLACEMENT     about 10 years ago per pt (around 2007)   [place],  URETEROSCOPY AND STENT PLACEMENT Left 06/16/2017   Procedure: CYSTOSCOPY WITH RETROGRADE PYELOGRAM, URETEROSCOPY,STONE EXTRACTION  AND STENT PLACEMENT;  Surgeon: Franchot Gallo, MD;  Location: [facility];  Service: Urology;  Laterality: Left;   ESOPHAGOGASTRODUODENOSCOPY  06/19/2004   Two esophageal rings and esophageal web as described above.  All of these were disrupted by passing 56-French Venia Minks dilator/ Candida esophagitis,which appears to be incidental given history of   antibiotic use, but nevertheless will be treated.   ESOPHAGOGASTRODUODENOSCOPY  10/30/2006   Distal tandem esophageal ring status post dilation disruption as  described above.  Otherwise normal esophagus/  Small hiatal hernia otherwise normal stomach, D1 and D2   ESOPHAGOGASTRODUODENOSCOPY N/A 03/22/2015   Dr.Rourk- noncritical schatzki's ring and hiatal hernia-o/w normal EGD.    ESOPHAGOGASTRODUODENOSCOPY (EGD) WITH ESOPHAGEAL DILATION  03/04/2012   RMR- schatzki's ring, hiatal hernia, polypoid gastric mucosa, bx= minimally active gastritis.   HOLMIUM LASER APPLICATION Left 5/61/5379   Procedure: HOLMIUM LASER APPLICATION;  Surgeon: Franchot Gallo, MD;  Location: [facility];  Service: Urology;  Laterality: Left;   IR GENERIC HISTORICAL  03/06/2016   IR RADIOLOGIST EVAL & MGMT 03/06/2016 Aletta Edouard, MD [facility] INTERV RAD   IR GENERIC HISTORICAL  06/18/2016   IR RADIOLOGIST EVAL & MGMT 06/18/2016 Aletta Edouard, MD [facility] INTERV RAD   IR RADIOLOGIST EVAL & MGMT  10/01/2016   IR RADIOLOGIST  EVAL & MGMT  10/15/2017   IR RADIOLOGIST EVAL & MGMT  12/24/2018   IR RADIOLOGIST EVAL & MGMT  01/04/2020   LEFT HEART CATH N/A 02/02/2012   Procedure: LEFT HEART CATH;  Surgeon: Lorretta Harp, MD;  Location: [facility] C[facility];  Service: Cardiovascular;  Laterality: N/A;   MEDIASTINOSCOPY     for dx sarcoid   RADIOLOGY WITH ANESTHESIA Left 05/17/2016   Procedure: left renal ablation;  Surgeon: Aletta Edouard, MD;  Location: [facility];  Service:  Radiology;  Laterality: Left;    SOCIAL HISTORY:  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    Employer: RETIRED  Tobacco Use   Smoking status: Former    Packs/day: 0.10    Years: 2.00    Pack years: 0.20    Types: Cigarettes, Cigars    Quit date: 05/06/1970    Years since quitting: 51.1   Smokeless tobacco: Never  Vaping Use   Vaping Use: Never used  Substance and Sexual Activity   Alcohol use: No    Alcohol/week: 0.0 standard drinks   Drug use: No   Sexual activity: Never  Other Topics Concern   Not on file  Social History Narrative   Not on file   Social Determinants of Health   Financial Resource Strain: Not on file  Food Insecurity: Not on file  Transportation Needs: Not on file  Physical Activity: Not on file  Stress: Not on file  Social Connections: Not on file  Intimate Partner Violence: Not on file    FAMILY HISTORY:  Family History  Problem Relation Age of Onset   Heart disease Father        deceased age 69   Stroke Mother    Alzheimer's disease Mother    Heart attack Brother        deceased age 72   Cancer Other        niece   Colon cancer Neg Hx     CURRENT MEDICATIONS:  Current Outpatient Medications  Medication Sig Dispense Refill   aspirin 81 MG tablet Take 81 mg by mouth every Monday, Wednesday, and Friday.     atorvastatin (LIPITOR) 40 MG tablet TAKE 1 TABLET BY MOUTH IN  THE EVENING (Patient taking differently: Take 40 mg by mouth every evening.) 90 tablet 3   atropine 1 % ophthalmic solution      brimonidine (ALPHAGAN) 0.2 % ophthalmic solution Place 1 drop into both eyes 2 (two) times daily.     clopidogrel (PLAVIX) 75 MG tablet TAKE 1 TABLET BY MOUTH  DAILY 90 tablet 3   dexamethasone (DECADRON) 2 MG  tablet Take 5 tablets (10 mg total) by mouth once a week. 20 tablet 3   dorzolamide-timolol (COSOPT) 22.3-6.8 MG/ML ophthalmic solution Place 1 drop into both eyes 2 (two) times daily.     fluocinonide cream (LIDEX) 6.27 % Apply 1 application topically 2 (two) times daily.     furosemide (LASIX) 20 MG tablet If you notice any swelling, you may take 40 mg (two tablets) for the day. (Patient taking differently: 40 mg daily as needed for fluid.) 90 tablet 3   isosorbide mononitrate (IMDUR) 60 MG 24 hr tablet Take 60 mg (  one tablet) in the morning and 30 mg (one-half tablet) in the evening. 180 tablet 2   lenalidomide (REVLIMID) 5 MG capsule Take 1 capsule (5 mg total) by mouth daily. 14 days on, 14 days off 14 capsule 0   levothyroxine (SYNTHROID) 100 MCG tablet Take 100 mcg by mouth daily before breakfast.     losartan (COZAAR) 100 MG tablet TAKE 1 TABLET BY MOUTH  DAILY 90 tablet 2   metoCLOPramide (REGLAN) 10 MG tablet Take 1 tablet (10 mg total) by mouth every 6 (six) hours as needed for nausea or vomiting. 15 tablet 0   metoprolol tartrate (LOPRESSOR) 25 MG tablet TAKE 1 TABLET BY MOUTH IN  THE MORNING AND 1/2 TABLET  IN THE EVENING (Patient taking differently: Take 12.5-25 mg by mouth See admin instructions. 25 mg in the morning  12.5 mg in the evening) 135 tablet 3   mometasone (ELOCON) 0.1 % ointment Apply 1 application topically daily.      Multiple Vitamins-Minerals (PRESERVISION/LUTEIN) CAPS Take 1 capsule by mouth 2 (two) times daily.     nitroGLYCERIN (NITROSTAT) 0.4 MG SL tablet Place 1 tablet (0.4 mg total) under the tongue every 5 (five) minutes as needed. For chest pain 25 tablet 2   pantoprazole (PROTONIX) 40 MG tablet TAKE 1 TABLET BY MOUTH  DAILY BEFORE BREAKFAST (Patient taking differently: Take 40 mg by mouth daily.) 90 tablet 3   potassium chloride SA (KLOR-CON M) 20 MEQ tablet TAKE 1 TABLET BY MOUTH  DAILY 90 tablet 3   prochlorperazine (COMPAZINE) 10 MG tablet Take 1 tablet (10  mg total) by mouth every 6 (six) hours as needed for nausea or vomiting. 30 tablet 3   ROCKLATAN 0.02-0.005 % SOLN Apply 1 drop to eye at bedtime.     triamcinolone (KENALOG) 0.1 % cream Apply 1 application topically 2 (two) times daily as needed (for irritation).     vitamin C (ASCORBIC ACID) 500 MG tablet Take 500 mg by mouth daily.     vitamin E 200 UNIT capsule Take 200 Units by mouth daily at 12 noon.      No current facility-administered medications for this visit.    ALLERGIES:  Allergies  Allergen Reactions   Azithromycin Other (See Comments)    Sore mouth and fever blisters around mouth, sores in nose area as well   Doxazosin Shortness Of Breath   Acetaminophen Other (See Comments)    REACTION: UNKNOWN REACTION   Atenolol Other (See Comments)    REACTION: UNKNOWN REACTION   Hydrocodone Nausea And Vomiting   Hydrocodone-Acetaminophen Nausea Only   Levofloxacin Other (See Comments)    Caused stomach problems.   Morphine Other (See Comments)    "made me crazy"   Penicillins Nausea And Vomiting and Other (See Comments)    Has patient had a PCN reaction causing immediate rash, facial/tongue/throat swelling, SOB or lightheadedness with hypotension: No Has patient had a PCN reaction causing severe rash involving mucus membranes or skin necrosis: No Has patient had a PCN reaction that required hospitalization No Has patient had a PCN reaction occurring within the last 10 years: No If all of the above answers are "NO", then may proceed with Cephalosporin use.    Sulfonamide Derivatives Nausea And Vomiting    PHYSICAL EXAM:  Performance status (ECOG): 0 - Asymptomatic  Vitals:   06/07/21 1509  BP: (!) 145/56  Pulse: 77  Resp: 16  Temp: 97.8 F (36.6 C)  SpO2: 98%   Wt Readings from  Last 3 Encounters:  06/07/21 129 lb 8 oz (58.7 kg)  05/23/21 130 lb (59 kg)  05/01/21 132 lb 11.5 oz (60.2 kg)   Physical Exam Vitals reviewed.  Constitutional:      Appearance: Normal  appearance.  Cardiovascular:     Rate and Rhythm: Normal rate and regular rhythm.     Pulses: Normal pulses.     Heart sounds: Normal heart sounds.  Pulmonary:     Effort: Pulmonary effort is normal.     Breath sounds: Normal breath sounds.  Neurological:     General: No focal deficit present.     Mental Status: He is alert and oriented to person, place, and time.  Psychiatric:        Mood and Affect: Mood normal.        Behavior: Behavior normal.    LABORATORY DATA:  I have reviewed the labs as listed.  CBC Latest Ref Rng & Units 06/07/2021 05/23/2021 05/10/2021  WBC 4.0 - 10.5 K/uL 4.5 3.6(L) 5.1  Hemoglobin 13.0 - 17.0 g/dL 6.1(LL) 7.0(L) 7.7(L)  Hematocrit 39.0 - 52.0 % 18.9(L) 21.4(L) 24.3(L)  Platelets 150 - 400 K/uL 70(L) 89(L) 65(L)   CMP Latest Ref Rng & Units 06/07/2021 05/23/2021 05/01/2021  Glucose 70 - 99 mg/dL 126(H) 122(H) 117(H)  BUN 8 - 23 mg/dL '22 23 20  ' Creatinine 0.61 - 1.24 mg/dL 0.85 0.74 0.69  Sodium 135 - 145 mmol/L 132(L) 136 133(L)  Potassium 3.5 - 5.1 mmol/L 3.7 3.7 3.4(L)  Chloride 98 - 111 mmol/L 105 106 104  CO2 22 - 32 mmol/L '24 23 24  ' Calcium 8.9 - 10.3 mg/dL 8.7(L) 9.0 8.3(L)  Total Protein 6.5 - 8.1 g/dL 7.6 8.6(H) 6.9  Total Bilirubin 0.3 - 1.2 mg/dL 0.5 0.9 0.8  Alkaline Phos 38 - 126 U/L 80 76 84  AST 15 - 41 U/L '17 19 15  ' ALT 0 - 44 U/L '17 21 19      ' Component Value Date/Time   RBC 1.60 (L) 06/07/2021 1424   MCV 118.1 (H) 06/07/2021 1424   MCV 107 (H) 10/26/2019 1013   MCH 38.1 (H) 06/07/2021 1424   MCHC 32.3 06/07/2021 1424   RDW 21.8 (H) 06/07/2021 1424   RDW 12.7 10/26/2019 1013   LYMPHSABS 1.2 06/07/2021 1424   LYMPHSABS CANCELED 09/08/2015 0933   MONOABS 0.5 06/07/2021 1424   EOSABS 0.4 06/07/2021 1424   EOSABS CANCELED 09/08/2015 0933   BASOSABS 0.0 06/07/2021 1424   BASOSABS CANCELED 09/08/2015 0933    DIAGNOSTIC IMAGING:  I have independently reviewed the scans and discussed with the patient. DG ABD ACUTE 2+V W 1V  CHEST  Result Date: 05/23/2021 CLINICAL DATA:  Nausea and vomiting EXAM: DG ABDOMEN ACUTE WITH 1 VIEW CHEST COMPARISON:  Chest 01/04/2021 FINDINGS: Cardiac and mediastinal contours normal. Lungs are clear without infiltrate or effusion. Normal bowel gas pattern. No obstruction or free air. Negative for urinary tract calculi. IMPRESSION: Negative abdominal radiographs.  No acute cardiopulmonary disease. Electronically Signed   By: Franchot Gallo M.D.   On: 05/23/2021 12:48   ECHOCARDIOGRAM COMPLETE  Result Date: 05/10/2021    ECHOCARDIOGRAM REPORT   Patient Name:   SALOMON GANSER Date of Exam: 05/10/2021 Medical Rec #:  [id number]      Height:       67.7 in Accession #:    [id number]     Weight:       132.7 lb Date of Birth:  Apr 27, 1929  BSA:          1.712 m Patient Age:    89 years       BP:           156/65 mmHg Patient Gender: M              HR:           73 bpm. Exam Location:  [facility] Procedure: 2D Echo, Cardiac Doppler and Color Doppler Indications:    CAD Native Vessel I25.10  History:        Patient has prior history of Echocardiogram examinations, most                 recent 12/23/2016. CHF, CAD; Risk Factors:Hypertension and                 Dyslipidemia. First-degree AV block. Renal cell carcinoma.  Sonographer:    Alvino Chapel RCS Referring Phys: [place]  1. Left ventricular ejection fraction, by estimation, is 60 to 65%. The left ventricle has normal function. The left ventricle has no regional wall motion abnormalities. Left ventricular diastolic parameters were normal.  2. Right ventricular systolic function is normal. The right ventricular size is normal. There is mildly elevated pulmonary artery systolic pressure.  3. The mitral valve is normal in structure. Mild mitral valve regurgitation. No evidence of mitral stenosis.  4. The aortic valve is tricuspid. Aortic valve regurgitation is mild. No aortic stenosis is present.  5. The inferior vena cava is normal in size  with greater than 50% respiratory variability, suggesting right atrial pressure of 3 mmHg. FINDINGS  Left Ventricle: Left ventricular ejection fraction, by estimation, is 60 to 65%. The left ventricle has normal function. The left ventricle has no regional wall motion abnormalities. The left ventricular internal cavity size was normal in size. There is  no left ventricular hypertrophy. Left ventricular diastolic parameters were normal. Right Ventricle: The right ventricular size is normal. No increase in right ventricular wall thickness. Right ventricular systolic function is normal. There is mildly elevated pulmonary artery systolic pressure. The tricuspid regurgitant velocity is 2.75  m/s, and with an assumed right atrial pressure of 8 mmHg, the estimated right ventricular systolic pressure is 89.3 mmHg. Left Atrium: Left atrial size was normal in size. Right Atrium: Right atrial size was normal in size. Pericardium: There is no evidence of pericardial effusion. Mitral Valve: The mitral valve is normal in structure. Mild mitral valve regurgitation. No evidence of mitral valve stenosis. Tricuspid Valve: The tricuspid valve is normal in structure. Tricuspid valve regurgitation is mild . No evidence of tricuspid stenosis. Aortic Valve: The aortic valve is tricuspid. Aortic valve regurgitation is mild. No aortic stenosis is present. Aortic valve mean gradient measures 3.5 mmHg. Aortic valve peak gradient measures 8.0 mmHg. Aortic valve area, by VTI measures 2.32 cm. Pulmonic Valve: The pulmonic valve was not well visualized. Pulmonic valve regurgitation is trivial. No evidence of pulmonic stenosis. Aorta: The aortic root is normal in size and structure. Venous: The inferior vena cava is normal in size with greater than 50% respiratory variability, suggesting right atrial pressure of 3 mmHg. IAS/Shunts: No atrial level shunt detected by color flow Doppler.  LEFT VENTRICLE PLAX 2D LVIDd:         4.60 cm   Diastology  LVIDs:         2.50 cm   LV e' medial:    8.49 cm/s LV PW:  0.80 cm   LV E/e' medial:  11.5 LV IVS:        0.90 cm   LV e' lateral:   7.51 cm/s LVOT diam:     2.10 cm   LV E/e' lateral: 13.0 LV SV:         72 LV SV Index:   42 LVOT Area:     3.46 cm  RIGHT VENTRICLE RV S prime:     12.60 cm/s TAPSE (M-mode): 2.6 cm LEFT ATRIUM             Index        RIGHT ATRIUM           Index LA diam:        2.90 cm 1.69 cm/m   RA Area:     16.50 cm LA Vol (A2C):   81.4 ml 47.55 ml/m  RA Volume:   46.00 ml  26.87 ml/m LA Vol (A4C):   56.7 ml 33.12 ml/m LA Biplane Vol: 72.2 ml 42.18 ml/m  AORTIC VALVE AV Area (Vmax):    2.13 cm AV Area (Vmean):   2.20 cm AV Area (VTI):     2.32 cm AV Vmax:           141.39 cm/s AV Vmean:          85.893 cm/s AV VTI:            0.312 m AV Peak Grad:      8.0 mmHg AV Mean Grad:      3.5 mmHg LVOT Vmax:         86.80 cm/s LVOT Vmean:        54.600 cm/s LVOT VTI:          0.209 m LVOT/AV VTI ratio: 0.67  AORTA Ao Root diam: 3.30 cm MITRAL VALVE                  TRICUSPID VALVE MV Area (PHT): 3.99 cm       TR Peak grad:   30.2 mmHg MV Decel Time: 190 msec       TR Vmax:        275.00 cm/s MR Peak grad:    127.7 mmHg MR Mean grad:    70.0 mmHg    SHUNTS MR Vmax:         565.00 cm/s  Systemic VTI:  0.21 m MR Vmean:        378.0 cm/s   Systemic Diam: 2.10 cm MR PISA:         1.01 cm MR PISA Eff ROA: 6 mm MR PISA Radius:  0.40 cm MV E velocity: 97.70 cm/s MV A velocity: 78.00 cm/s MV E/A ratio:  1.25 Carlyle Dolly MD Electronically signed by Carlyle Dolly MD Signature Date/Time: 05/10/2021/12:42:48 PM    Final      ASSESSMENT:  1.  IgG lambda plasma cell myeloma: - Work-up for macrocytic anemia on 12/01/2020 showed M spike of 2.9 g.  Immunofixation IgG lambda. - Lambda light chains elevated at 284.  Light chain ratio was 0.04.  LDH was 163.  Creatinine was 0.9 and calcium 8.9. - Bone marrow biopsy on 12/18/2020-hypercellular marrow for age with 48% atypical plasma cells with  lambda light chain restriction. - Chromosome analysis was normal. 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 - Revlimid 5 mg, 2 weeks on/1 week off started on 01/20/2021.  Dexamethasone  weekly 10 mg added on 02/07/2021.  2.  Macrocytic anemia: - CBC on 12/01/2020 with hemoglobin 8.5 and MCV of 117. - Denies any bleeding per rectum or melena.  3.  Social/family history: - Lives at home with his wife.  He does all ADLs and IADLs.  He even does yard work. - He worked at [employer] for 35 years.  Denies any chemical exposure.  Non-smoker. - No family history of malignancies.   PLAN:  1.  IgG lambda plasma cell myeloma, high risk: - He started Revlimid 5 mg 2 weeks on/2 weeks off on 05/28/2021.  Prior to that he last took it around 04/11/2022. - He will continue Revlimid for a total of 2 weeks. - Reviewed myeloma panel from 05/01/2021 which showed M spike 2.0 and stable.  Lambda light chains have increased to 532 with ratio of 0.02. - We have sent a myeloma panel from today.  We will follow-up on it next week. - We are having difficulty with thrombocytopenia from Revlimid. - I have discussed changing Revlimid to Velcade weekly 3 weeks on/1 week off.  However we will follow-up on myeloma panel from today and make that decision next week. - We have also discussed about stopping treatment and continuing just transfusion support.  He would like to continue active treatment as he is not experiencing any major side effects.  2.  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 - 05/23/2021, presented to the ER with nausea and received 1 unit PRBC. - Today hemoglobin is 6.1 with MCV 118. - We will arrange for 2 units PRBC tomorrow.  3.  Thromboprophylaxis: - Plavix was stopped on 06/06/2021 as he had rectal bleeding, last time last week.  Platelet count today is 70 K. - He will continue to hold  Plavix at this time.  We will check another CBC next week and decide if he can resume Plavix. - His son will not be back with him until 06/16/2021 to set up his pillbox.  4.  Right ankle swelling: - Continue Lasix as needed.  Orders placed this encounter:  No orders of the defined types were placed in this encounter.    Derek Jack, MD [place] [phone]   I, Thana Ates, am acting as a scribe for Dr. Derek Jack.  I, Derek Jack MD, have reviewed the above documentation for accuracy and completeness, and I agree with the above.

## 2021-06-07 NOTE — Progress Notes (Signed)
Patient is taking Revlimid as prescribed. Patient has not missed any doses and reports no side effects at this time.

## 2021-06-07 NOTE — Progress Notes (Unsigned)
CRITICAL VALUE ALERT Critical value received:  HGB 6.1  Date of notification:  06-07-2021 Time of notification: 14:52 pm Critical value read back:  Yes.   Nurse who received alert:  B. Sheana Bir RN MD notified time and response:  Dr. Delton Coombes .    Orders received to infuse 2 units of blood tomorrow.

## 2021-06-07 NOTE — Patient Instructions (Signed)
[place] at [facility] Discharge Instructions  You were seen and examined today by Dr. Delton Coombes. He reviewed your most recent labs and your platelets look good for now. Come get your blood tomorrow.  We will get the myeloma labs back next week and decide if you should start on Velcade injections. Continue Revlimid until you finish the bottle. Remain off of Plavix until further notice. Do not order anymore until we make a decision based off your myeloma labs. Please keep follow up appointments as scheduled.   Thank you for choosing [place] at [facility] to provide your oncology and hematology care.  To afford each patient quality time with our provider, please arrive at least 15 minutes before your scheduled appointment time.   If you have a lab appointment with the [facility] please come in thru the Main Entrance and check in at the main information desk.  You need to re-schedule your appointment should you arrive 10 or more minutes late.  We strive to give you quality time with our providers, and arriving late affects you and other patients whose appointments are after yours.  Also, if you no show three or more times for appointments you may be dismissed from the clinic at the providers discretion.     Again, thank you for choosing [facility].  Our hope is that these requests will decrease the amount of time that you wait before being seen by our physicians.       _____________________________________________________________  Should you have questions after your visit to [facility], please contact our office at [phone] and follow the prompts.  Our office hours are 8:00 a.m. and 4:30 p.m. Monday - Friday.  Please note that voicemails left after 4:00 p.m. may not be returned until the following business day.  We are closed weekends and major holidays.  You do have access to a nurse 24-7, just call the main  number to the clinic [phone] and do not press any options, hold on the line and a nurse will answer the phone.    For prescription refill requests, have your pharmacy contact our office and allow 72 hours.    Due to Covid, you will need to wear a mask upon entering the hospital. If you do not have a mask, a mask will be given to you at the Main Entrance upon arrival. For doctor visits, patients may have 1 support person age 40 or older with them. For treatment visits, patients can not have anyone with them due to social distancing guidelines and our immunocompromised population.

## 2021-06-08 ENCOUNTER — Encounter (HOSPITAL_COMMUNITY): Payer: Self-pay

## 2021-06-08 ENCOUNTER — Inpatient Hospital Stay (HOSPITAL_COMMUNITY): Payer: Medicare Other

## 2021-06-08 DIAGNOSIS — Z5112 Encounter for antineoplastic immunotherapy: Secondary | ICD-10-CM | POA: Diagnosis not present

## 2021-06-08 DIAGNOSIS — D696 Thrombocytopenia, unspecified: Secondary | ICD-10-CM

## 2021-06-08 DIAGNOSIS — C9000 Multiple myeloma not having achieved remission: Secondary | ICD-10-CM

## 2021-06-08 DIAGNOSIS — D630 Anemia in neoplastic disease: Secondary | ICD-10-CM | POA: Diagnosis not present

## 2021-06-08 DIAGNOSIS — Z79899 Other long term (current) drug therapy: Secondary | ICD-10-CM | POA: Diagnosis not present

## 2021-06-08 DIAGNOSIS — D649 Anemia, unspecified: Secondary | ICD-10-CM

## 2021-06-08 DIAGNOSIS — D693 Immune thrombocytopenic purpura: Secondary | ICD-10-CM | POA: Diagnosis not present

## 2021-06-08 LAB — KAPPA/LAMBDA LIGHT CHAINS
Kappa free light chain: 15.4 mg/L (ref 3.3–19.4)
Kappa, lambda light chain ratio: 0.03 — ABNORMAL LOW (ref 0.26–1.65)
Lambda free light chains: 582.8 mg/L — ABNORMAL HIGH (ref 5.7–26.3)

## 2021-06-08 MED ORDER — SODIUM CHLORIDE 0.9% IV SOLUTION
250.0000 mL | Freq: Once | INTRAVENOUS | Status: AC
  Administered 2021-06-08: 250 mL via INTRAVENOUS

## 2021-06-08 MED ORDER — ACETAMINOPHEN 325 MG PO TABS
650.0000 mg | ORAL_TABLET | Freq: Once | ORAL | Status: AC
  Administered 2021-06-08: 650 mg via ORAL
  Filled 2021-06-08: qty 2

## 2021-06-08 MED ORDER — DIPHENHYDRAMINE HCL 25 MG PO CAPS
25.0000 mg | ORAL_CAPSULE | Freq: Once | ORAL | Status: AC
  Administered 2021-06-08: 25 mg via ORAL
  Filled 2021-06-08: qty 1

## 2021-06-08 NOTE — Progress Notes (Signed)
Patient presents today for 2 units of blood, pre-medications given. Patient tolerated 2 units of blood with no complaints voiced. Peripheral IV site clean and dry with good blood return noted before and after infusion. Band aid applied. VSS with discharge and left in satisfactory condition with no s/s of distress noted.

## 2021-06-08 NOTE — Patient Instructions (Signed)
[facility]  Discharge Instructions: °Thank you for choosing [facility] to provide your oncology and hematology care.  °If you have a lab appointment with the Cancer Center, please come in thru the Main Entrance and check in at the main information desk. ° °Wear comfortable clothing and clothing appropriate for easy access to any Portacath or PICC line.  ° °We strive to give you quality time with your provider. You may need to reschedule your appointment if you arrive late (15 or more minutes).  Arriving late affects you and other patients whose appointments are after yours.  Also, if you miss three or more appointments without notifying the office, you may be dismissed from the clinic at the provider’s discretion.    °  °For prescription refill requests, have your pharmacy contact our office and allow 72 hours for refills to be completed.   ° °Today you received the following 2 units of PRBCs. Return as scheduled. °  °To help prevent nausea and vomiting after your treatment, we encourage you to take your nausea medication as directed. ° °BELOW ARE SYMPTOMS THAT SHOULD BE REPORTED IMMEDIATELY: °*FEVER GREATER THAN 100.4 F (38 °C) OR HIGHER °*CHILLS OR SWEATING °*NAUSEA AND VOMITING THAT IS NOT CONTROLLED WITH YOUR NAUSEA MEDICATION °*UNUSUAL SHORTNESS OF BREATH °*UNUSUAL BRUISING OR BLEEDING °*URINARY PROBLEMS (pain or burning when urinating, or frequent urination) °*BOWEL PROBLEMS (unusual diarrhea, constipation, pain near the anus) °TENDERNESS IN MOUTH AND THROAT WITH OR WITHOUT PRESENCE OF ULCERS (sore throat, sores in mouth, or a toothache) °UNUSUAL RASH, SWELLING OR PAIN  °UNUSUAL VAGINAL DISCHARGE OR ITCHING  ° °Items with * indicate a potential emergency and should be followed up as soon as possible or go to the Emergency Department if any problems should occur. ° °Please show the CHEMOTHERAPY ALERT CARD or IMMUNOTHERAPY ALERT CARD at check-in to the Emergency Department and triage  nurse. ° °Should you have questions after your visit or need to cancel or reschedule your appointment, please contact [facility] [phone]  and follow the prompts.  Office hours are 8:00 a.m. to 4:30 p.m. Monday - Friday. Please note that voicemails left after 4:00 p.m. may not be returned until the following business day.  We are closed weekends and major holidays. You have access to a nurse at all times for urgent questions. Please call the main number to the clinic [phone] and follow the prompts. ° °For any non-urgent questions, you may also contact your provider using MyChart. We now offer e-Visits for anyone 18 and older to request care online for non-urgent symptoms. For details visit [email or web address]. °  °Also download the MyChart app! Go to the app store, search "MyChart", open the app, select [facility], and log in with your MyChart username and password. ° °Due to Covid, a mask is required upon entering the hospital/clinic. If you do not have a mask, one will be given to you upon arrival. For doctor visits, patients may have 1 support person aged 18 or older with them. For treatment visits, patients cannot have anyone with them due to current Covid guidelines and our immunocompromised population.  °

## 2021-06-10 LAB — TYPE AND SCREEN
ABO/RH(D): O POS
Antibody Screen: NEGATIVE
Unit division: 0
Unit division: 0

## 2021-06-10 LAB — BPAM RBC
Blood Product Expiration Date: 202303112359
Blood Product Expiration Date: 202303122359
ISSUE DATE / TIME: 202302031031
ISSUE DATE / TIME: 202302031215
Unit Type and Rh: 5100
Unit Type and Rh: 5100

## 2021-06-11 LAB — PROTEIN ELECTROPHORESIS, SERUM
A/G Ratio: 0.7 (ref 0.7–1.7)
Albumin ELP: 3.2 g/dL (ref 2.9–4.4)
Alpha-1-Globulin: 0.2 g/dL (ref 0.0–0.4)
Alpha-2-Globulin: 0.6 g/dL (ref 0.4–1.0)
Beta Globulin: 0.6 g/dL — ABNORMAL LOW (ref 0.7–1.3)
Gamma Globulin: 2.9 g/dL — ABNORMAL HIGH (ref 0.4–1.8)
Globulin, Total: 4.3 g/dL — ABNORMAL HIGH (ref 2.2–3.9)
M-Spike, %: 2.8 g/dL — ABNORMAL HIGH
Total Protein ELP: 7.5 g/dL (ref 6.0–8.5)

## 2021-06-14 ENCOUNTER — Ambulatory Visit (HOSPITAL_COMMUNITY)
Admission: RE | Admit: 2021-06-14 | Discharge: 2021-06-14 | Disposition: A | Discharge status: Home / Self Care | Payer: Medicare Other | Source: Ambulatory Visit | Attending: Interventional Radiology | Admitting: Interventional Radiology

## 2021-06-14 ENCOUNTER — Other Ambulatory Visit: Payer: Self-pay

## 2021-06-14 ENCOUNTER — Inpatient Hospital Stay (HOSPITAL_COMMUNITY): Payer: Medicare Other

## 2021-06-14 DIAGNOSIS — N2889 Other specified disorders of kidney and ureter: Secondary | ICD-10-CM | POA: Insufficient documentation

## 2021-06-14 DIAGNOSIS — N200 Calculus of kidney: Secondary | ICD-10-CM | POA: Diagnosis not present

## 2021-06-14 DIAGNOSIS — D649 Anemia, unspecified: Secondary | ICD-10-CM

## 2021-06-14 DIAGNOSIS — C9000 Multiple myeloma not having achieved remission: Secondary | ICD-10-CM

## 2021-06-14 DIAGNOSIS — K7689 Other specified diseases of liver: Secondary | ICD-10-CM | POA: Diagnosis not present

## 2021-06-14 DIAGNOSIS — D696 Thrombocytopenia, unspecified: Secondary | ICD-10-CM

## 2021-06-14 DIAGNOSIS — Z79899 Other long term (current) drug therapy: Secondary | ICD-10-CM | POA: Diagnosis not present

## 2021-06-14 DIAGNOSIS — Z87448 Personal history of other diseases of urinary system: Secondary | ICD-10-CM | POA: Diagnosis not present

## 2021-06-14 DIAGNOSIS — D693 Immune thrombocytopenic purpura: Secondary | ICD-10-CM | POA: Diagnosis not present

## 2021-06-14 DIAGNOSIS — Z5112 Encounter for antineoplastic immunotherapy: Secondary | ICD-10-CM | POA: Diagnosis not present

## 2021-06-14 DIAGNOSIS — K8020 Calculus of gallbladder without cholecystitis without obstruction: Secondary | ICD-10-CM | POA: Diagnosis not present

## 2021-06-14 DIAGNOSIS — D630 Anemia in neoplastic disease: Secondary | ICD-10-CM | POA: Diagnosis not present

## 2021-06-14 LAB — CBC WITH DIFFERENTIAL/PLATELET
Abs Immature Granulocytes: 0.01 10*3/uL (ref 0.00–0.07)
Basophils Absolute: 0 10*3/uL (ref 0.0–0.1)
Basophils Relative: 0 %
Eosinophils Absolute: 0.3 10*3/uL (ref 0.0–0.5)
Eosinophils Relative: 8 %
HCT: 25.2 % — ABNORMAL LOW (ref 39.0–52.0)
Hemoglobin: 7.9 g/dL — ABNORMAL LOW (ref 13.0–17.0)
Immature Granulocytes: 0 %
Lymphocytes Relative: 38 %
Lymphs Abs: 1.2 10*3/uL (ref 0.7–4.0)
MCH: 34.3 pg — ABNORMAL HIGH (ref 26.0–34.0)
MCHC: 31.3 g/dL (ref 30.0–36.0)
MCV: 109.6 fL — ABNORMAL HIGH (ref 80.0–100.0)
Monocytes Absolute: 0.5 10*3/uL (ref 0.1–1.0)
Monocytes Relative: 17 %
Neutro Abs: 1.2 10*3/uL — ABNORMAL LOW (ref 1.7–7.7)
Neutrophils Relative %: 37 %
Platelets: 34 10*3/uL — ABNORMAL LOW (ref 150–400)
RBC: 2.3 MIL/uL — ABNORMAL LOW (ref 4.22–5.81)
RDW: 20.2 % — ABNORMAL HIGH (ref 11.5–15.5)
WBC: 3.1 10*3/uL — ABNORMAL LOW (ref 4.0–10.5)
nRBC: 0 % (ref 0.0–0.2)

## 2021-06-14 LAB — SAMPLE TO BLOOD BANK

## 2021-06-14 MED ORDER — IOHEXOL 300 MG/ML  SOLN
100.0000 mL | Freq: Once | INTRAMUSCULAR | Status: AC | PRN
  Administered 2021-06-14: 100 mL via INTRAVENOUS

## 2021-06-20 ENCOUNTER — Other Ambulatory Visit: Payer: Self-pay

## 2021-06-20 ENCOUNTER — Ambulatory Visit
Admission: RE | Admit: 2021-06-20 | Discharge: 2021-06-20 | Disposition: A | Discharge status: Home / Self Care | Payer: Medicare Other | Source: Ambulatory Visit | Attending: Interventional Radiology | Admitting: Interventional Radiology

## 2021-06-20 ENCOUNTER — Encounter: Payer: Self-pay | Admitting: *Deleted

## 2021-06-20 DIAGNOSIS — C9000 Multiple myeloma not having achieved remission: Secondary | ICD-10-CM | POA: Diagnosis not present

## 2021-06-20 DIAGNOSIS — N2889 Other specified disorders of kidney and ureter: Secondary | ICD-10-CM | POA: Diagnosis not present

## 2021-06-20 HISTORY — PX: IR RADIOLOGIST EVAL & MGMT: IMG5224

## 2021-06-20 NOTE — Progress Notes (Signed)
Chief Complaint: Patient was consulted remotely today (TeleHealth) for follow-up after cryoablation of a 1.8 cm left renal carcinoma on 05/17/2016.  History of Present Illness: Dale [name] is a 88 y.o. 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  He developed IgG lambda plasma cell multiple myeloma with associated significant anemia last year and is receiving treatment by Dr. Delton Coombes.  He states that he is not having any significant symptoms related to his myeloma or treatment.  He did have some recent rectal bleeding related to low platelet count and Plavix was also held due to rectal bleeding.  He did have a blood transfusion 2 days ago.  The last follow-up imaging post left renal ablation was on 12/31/2019 that demonstrated a small focal area of arterial enhancement measuring approximately 3 mm along the inferior margin of the ablation zone.  A follow-up CT was recently performed on 06/14/2021.  Past Medical History:  Diagnosis Date   Allergic rhinitis    Anal fissure    Anginal pain (HCC)    Asthma    Cancer (HCC)    skin   Chest pain    CHF (congestive heart failure) ([place])    Coronary heart disease    s/p stenting. cath in 01/2012 noncritical occlusion   Dysrhythmia    1st degree heart block   GERD (gastroesophageal reflux disease)    Glaucoma    Hiatal hernia    Hyperlipidemia    Hypertension    Hypothyroidism    Idiopathic thrombocytopenic purpura ([place]) 2002   Macular degeneration    Nephrolithiasis    PUD (peptic ulcer disease)    remote   Sarcoidosis    pulmonary   Schatzki's ring     Past Surgical History:  Procedure Laterality Date   cardiac stents     COLONOSCOPY  10/30/2006   Normal rectum, sigmoid diverticula.Remainder of colonic mucosa appeared normal.   CORONARY ANGIOPLASTY WITH STENT PLACEMENT     about 10 years ago per pt (around 2007)   [place], URETEROSCOPY AND STENT PLACEMENT  Left 06/16/2017   Procedure: CYSTOSCOPY WITH RETROGRADE PYELOGRAM, URETEROSCOPY,STONE EXTRACTION  AND STENT PLACEMENT;  Surgeon: Franchot Gallo, MD;  Location: [facility];  Service: Urology;  Laterality: Left;   ESOPHAGOGASTRODUODENOSCOPY  06/19/2004   Two esophageal rings and esophageal web as described above.  All of these were disrupted by passing 56-French Venia Minks dilator/ Candida esophagitis,which appears to be incidental given history of   antibiotic use, but nevertheless will be treated.   ESOPHAGOGASTRODUODENOSCOPY  10/30/2006   Distal tandem esophageal ring status post dilation disruption as  described above.  Otherwise normal esophagus/  Small hiatal hernia otherwise normal stomach, D1 and D2   ESOPHAGOGASTRODUODENOSCOPY N/A 03/22/2015   Dr.Rourk- noncritical schatzki's ring and hiatal hernia-o/w normal EGD.    ESOPHAGOGASTRODUODENOSCOPY (EGD) WITH ESOPHAGEAL DILATION  03/04/2012   RMR- schatzki's ring, hiatal hernia, polypoid gastric mucosa, bx= minimally active gastritis.   HOLMIUM LASER APPLICATION Left 3/33/5456   Procedure: HOLMIUM LASER APPLICATION;  Surgeon: Franchot Gallo, MD;  Location: [facility];  Service: Urology;  Laterality: Left;   IR GENERIC HISTORICAL  03/06/2016   IR RADIOLOGIST EVAL & MGMT 03/06/2016 Aletta Edouard, MD [facility] INTERV RAD   IR GENERIC HISTORICAL  06/18/2016   IR RADIOLOGIST EVAL & MGMT 06/18/2016 Aletta Edouard, MD [facility] INTERV RAD   IR RADIOLOGIST EVAL & MGMT  10/01/2016   IR RADIOLOGIST EVAL & MGMT  10/15/2017  IR RADIOLOGIST EVAL & MGMT  12/24/2018   IR RADIOLOGIST EVAL & MGMT  01/04/2020   LEFT HEART CATH N/A 02/02/2012   Procedure: LEFT HEART CATH;  Surgeon: Lorretta Harp, MD;  Location: [facility] CATH LAB;  Service: Cardiovascular;  Laterality: N/A;   MEDIASTINOSCOPY     for dx sarcoid   RADIOLOGY WITH ANESTHESIA Left 05/17/2016   Procedure: left renal ablation;  Surgeon: Aletta Edouard, MD;  Location: [facility];  Service: Radiology;  Laterality: Left;     Allergies: Azithromycin, Doxazosin, Acetaminophen, Atenolol, Hydrocodone, Hydrocodone-acetaminophen, Levofloxacin, Morphine, Penicillins, and Sulfonamide derivatives  Medications: Prior to Admission medications   Medication Sig Start Date End Date Taking? Authorizing Provider  aspirin 81 MG tablet Take 81 mg by mouth every Monday, Wednesday, and Friday.    [provider]  atorvastatin (LIPITOR) 40 MG tablet TAKE 1 TABLET BY MOUTH IN  THE EVENING Patient taking differently: Take 40 mg by mouth every evening. 10/23/20   Troy Sine, MD  atropine 1 % ophthalmic solution  01/10/21   [provider]  brimonidine (ALPHAGAN) 0.2 % ophthalmic solution Place 1 drop into both eyes 2 (two) times daily.    [provider]  clopidogrel (PLAVIX) 75 MG tablet TAKE 1 TABLET BY MOUTH  DAILY 02/16/21   Troy Sine, MD  dexamethasone (DECADRON) 2 MG tablet Take 5 tablets (10 mg total) by mouth once a week. 02/07/21   Derek Jack, MD  dorzolamide-timolol (COSOPT) 22.3-6.8 MG/ML ophthalmic solution Place 1 drop into both eyes 2 (two) times daily.    [provider]  fluocinonide cream (LIDEX) 4.16 % Apply 1 application topically 2 (two) times daily. 01/05/21   [provider]  furosemide (LASIX) 20 MG tablet If you notice any swelling, you may take 40 mg (two tablets) for the day. Patient taking differently: 40 mg daily as needed for fluid. 03/28/21   Troy Sine, MD  isosorbide mononitrate (IMDUR) 60 MG 24 hr tablet Take 60 mg (one tablet) in the morning and 30 mg (one-half tablet) in the evening. 03/28/21   Troy Sine, MD  lenalidomide (REVLIMID) 5 MG capsule Take 1 capsule (5 mg total) by mouth daily. 14 days on, 14 days off 05/22/21   Derek Jack, MD  levothyroxine (SYNTHROID) 100 MCG tablet Take 100 mcg by mouth daily before breakfast.    [provider]  losartan (COZAAR) 100 MG tablet TAKE 1 TABLET BY MOUTH  DAILY 01/15/21    Troy Sine, MD  metoCLOPramide (REGLAN) 10 MG tablet Take 1 tablet (10 mg total) by mouth every 6 (six) hours as needed for nausea or vomiting. 05/23/21   Evalee Jefferson, PA-C  metoprolol tartrate (LOPRESSOR) 25 MG tablet TAKE 1 TABLET BY MOUTH IN  THE MORNING AND 1/2 TABLET  IN THE EVENING Patient taking differently: Take 12.5-25 mg by mouth See admin instructions. 25 mg in the morning  12.5 mg in the evening 08/14/20   Troy Sine, MD  mometasone (ELOCON) 0.1 % ointment Apply 1 application topically daily.     [provider]  Multiple Vitamins-Minerals (PRESERVISION/LUTEIN) CAPS Take 1 capsule by mouth 2 (two) times daily.    [provider]  nitroGLYCERIN (NITROSTAT) 0.4 MG SL tablet Place 1 tablet (0.4 mg total) under the tongue every 5 (five) minutes as needed. For chest pain 09/27/20   Troy Sine, MD  pantoprazole (PROTONIX) 40 MG tablet TAKE 1 TABLET BY MOUTH  DAILY BEFORE BREAKFAST Patient taking  differently: Take 40 mg by mouth daily. 08/18/20   Mahala Menghini, PA-C  potassium chloride SA (KLOR-CON M) 20 MEQ tablet TAKE 1 TABLET BY MOUTH  DAILY 05/28/21   Troy Sine, MD  prochlorperazine (COMPAZINE) 10 MG tablet Take 1 tablet (10 mg total) by mouth every 6 (six) hours as needed for nausea or vomiting. 01/15/21   Derek Jack, MD  ROCKLATAN 0.02-0.005 % SOLN Apply 1 drop to eye at bedtime. 12/21/19   [provider]  triamcinolone (KENALOG) 0.1 % cream Apply 1 application topically 2 (two) times daily as needed (for irritation).    [provider]  vitamin C (ASCORBIC ACID) 500 MG tablet Take 500 mg by mouth daily.    [provider]  vitamin E 200 UNIT capsule Take 200 Units by mouth daily at 12 noon.     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    Employer: RETIRED  Tobacco Use   Smoking status: Former    Packs/day: 0.10    Years: 2.00    Pack years: 0.20    Types: Cigarettes, Cigars    Quit date: 05/06/1970    Years since quitting: 51.1   Smokeless tobacco: Never  Vaping Use   Vaping Use: Never used  Substance and Sexual Activity   Alcohol use: No    Alcohol/week: 0.0 standard drinks   Drug use: No   Sexual activity: Never  Other Topics Concern   Not on file  Social History Narrative   Not on file   Social Determinants of Health   Financial Resource Strain: Not on file  Food Insecurity: Not on file  Transportation Needs: Not on file  Physical Activity: Not on file  Stress: Not on file  Social Connections: Not on file    ECOG Status: 0 - Asymptomatic  Review of Systems  Constitutional: Negative.   Respiratory: Negative.    Cardiovascular: Negative.   Gastrointestinal:  Positive for anal bleeding. Negative for abdominal distention, abdominal pain, nausea and vomiting.  Genitourinary: Negative.   Musculoskeletal: Negative.   Neurological: Negative.    Review of Systems: A 12 point ROS discussed and pertinent positives are indicated in the HPI above.  All other systems are negative.  Physical Exam No direct physical exam was performed (except for noted visual exam findings with Video Visits).   Vital Signs: There were no vitals taken for this visit.  Imaging: CT ABDOMEN W WO CONTRAST  Result Date: 06/15/2021 CLINICAL DATA:  Follow-up left renal mass, status post cryoablation, history of multiple myeloma, ongoing chemotherapy EXAM: CT ABDOMEN WITHOUT AND WITH CONTRAST  TECHNIQUE: Multidetector CT imaging of the abdomen was performed following the standard protocol before and following the bolus administration of intravenous contrast. RADIATION DOSE  REDUCTION: This exam was performed according to the departmental dose-optimization program which includes automated exposure control, adjustment of the mA and/or kV according to patient size and/or use of iterative reconstruction technique. CONTRAST:  164m OMNIPAQUE IOHEXOL 300 MG/ML  SOLN COMPARISON:  CT abdomen pelvis, 10/09/2020 FINDINGS: Lower chest: No acute abnormality.  Coronary artery calcifications. Hepatobiliary: No solid liver abnormality is seen. Simple cysts of the liver. Small gallstone in the dependent gallbladder. Gallbladder wall thickening, or biliary dilatation. Pancreas: Unremarkable. No pancreatic ductal dilatation or surrounding inflammatory changes. Spleen: Normal in size without significant abnormality. Adrenals/Urinary Tract: Adrenal glands are unremarkable. Redemonstrated post procedural findings of percutaneous ablation of the peripheral inferior pole of the left kidney. An arterially hyperenhancing nodule at the anterior aspect of the ablation site has increased in size, measuring 0.7 x 0.7 cm, previously 0.3 cm (series 6, image 37). Additional subcentimeter bilateral low-attenuation lesions, most likely tiny cysts and unchanged without evidence of other suspicious contrast enhancement. Small bilateral nonobstructive renal calculi. No hydronephrosis. Bladder is unremarkable. Stomach/Bowel: Stomach is within normal limits. Appendix appears normal. No evidence of bowel wall thickening, distention, or inflammatory changes. Vascular/Lymphatic: Aortic atherosclerosis. No enlarged abdominal lymph nodes. Other: No abdominal wall hernia or abnormality. No ascites. Musculoskeletal: No acute or significant osseous findings. IMPRESSION: 1. Redemonstrated post procedural findings of percutaneous ablation of the  peripheral inferior pole of the left kidney. An arterially hyperenhancing nodule at the anterior aspect of the ablation site has increased in size, measuring 0.7 x 0.7 cm, previously 0.3 cm, consistent with locally recurrent malignancy. 2. No evidence of renal vein invasion, lymphadenopathy or metastatic disease in the abdomen or pelvis at this time. 3. Nonobstructive bilateral nephrolithiasis. 4. Cholelithiasis. 5. Coronary artery disease. These results will be called to the ordering clinician or representative by the Radiologist Assistant, and communication documented in the PACS or [employer] Aortic Atherosclerosis (ICD10-I70.0). Electronically Signed   By: ADelanna AhmadiM.D.   On: 06/15/2021 09:54   DG ABD ACUTE 2+V W 1V CHEST  Result Date: 05/23/2021 CLINICAL DATA:  Nausea and vomiting EXAM: DG ABDOMEN ACUTE WITH 1 VIEW CHEST COMPARISON:  Chest 01/04/2021 FINDINGS: Cardiac and mediastinal contours normal. Lungs are clear without infiltrate or effusion. Normal bowel gas pattern. No obstruction or free air. Negative for urinary tract calculi. IMPRESSION: Negative abdominal radiographs.  No acute cardiopulmonary disease. Electronically Signed   By: CFranchot GalloM.D.   On: 05/23/2021 12:48    Labs:  CBC: Recent Labs    05/10/21 0938 05/23/21 1025 06/07/21 1424 06/14/21 1116  WBC 5.1 3.6* 4.5 3.1*  HGB 7.7* 7.0* 6.1* 7.9*  HCT 24.3* 21.4* 18.9* 25.2*  PLT 65* 89* 70* 34*    COAGS: No results for input(s): INR, APTT in the last 8760 hours.  BMP: Recent Labs    04/02/21 1042 05/01/21 1327 05/23/21 1025 06/07/21 1424  NA 134* 133* 136 132*  K 3.4* 3.4* 3.7 3.7  CL 104 104 106 105  CO2 _0 GLUCOSE 138* 117* 122* 126*  BUN _1 CALCIUM 8.8* 8.3* 9.0 8.7*  CREATININE 0.86 0.69 0.74 0.85  GFRNONAA >60 >60 >60 >60    LIVER FUNCTION TESTS: Recent Labs    04/02/21 1042 05/01/21 1327 05/23/21 1025 06/07/21 1424  BILITOT 0.9 0.8 0.9 0.5  AST _2 ALT _3 ALKPHOS 116 84 76 80  PROT 7.2 6.9 8.6* 7.6  ALBUMIN 2.9* 2.9* 2.8*  2.7*     Assessment and Plan:  I called Dale [name] by phone and reviewed the recent follow-up CT findings with him from 06/14/2021.  By my measurements, a nodular focus of arterial enhancement within the region of prior cryoablation of the left kidney has increased in size from approximately 3 x 4 x 3 mm in 2021 up to approximately 8 x 7 x 8 mm.  Findings are most likely secondary to renal carcinoma recurrence.  This remains a small area of recurrence and I recommended continued surveillance given that Dale [name] will be turning 88 years old later this week and is actively being treated for multiple myeloma which is requiring periodic blood transfusion and has resulted in thrombocytopenia causing rectal bleeding.  I told him that I do not feel that it is worth the risk of placing him under general anesthesia at this time to ablate a small asymptomatic recurrence.  I told him that if he is doing well around this time next year, I would recommend a follow-up CT to reassess the left kidney.  He is agreeable to this plan.   Electronically Signed: Azzie Roup 06/20/2021, 9:46 AM    I spent a total of 10 Minutes in remote  clinical consultation, greater than 50% of which was counseling/coordinating care post cryoablation of a left renal carcinoma.    Visit type: Audio only (telephone). Audio (no video) only due to patient's lack of internet/smartphone capability. Alternative for in-person consultation at [facility], [address], [place], [place]. This visit type was conducted due to national recommendations for restrictions regarding the COVID-19 Pandemic (e.g. social distancing).  This format is felt to be most appropriate for this patient at this time.  All issues noted in this document were discussed and addressed.

## 2021-06-22 ENCOUNTER — Other Ambulatory Visit (HOSPITAL_COMMUNITY): Payer: Self-pay

## 2021-06-22 DIAGNOSIS — C9000 Multiple myeloma not having achieved remission: Secondary | ICD-10-CM

## 2021-06-25 ENCOUNTER — Other Ambulatory Visit: Payer: Self-pay

## 2021-06-25 ENCOUNTER — Inpatient Hospital Stay (HOSPITAL_COMMUNITY): Payer: Medicare Other

## 2021-06-25 ENCOUNTER — Other Ambulatory Visit (HOSPITAL_COMMUNITY): Payer: Self-pay | Admitting: Hematology

## 2021-06-25 ENCOUNTER — Encounter (HOSPITAL_COMMUNITY): Payer: Self-pay | Admitting: Hematology

## 2021-06-25 ENCOUNTER — Inpatient Hospital Stay (HOSPITAL_BASED_OUTPATIENT_CLINIC_OR_DEPARTMENT_OTHER): Payer: Medicare Other | Admitting: Hematology

## 2021-06-25 VITALS — BP 160/63 | HR 75 | Temp 98.3°F | Resp 20 | Ht 67.0 in | Wt 128.2 lb

## 2021-06-25 DIAGNOSIS — D630 Anemia in neoplastic disease: Secondary | ICD-10-CM | POA: Diagnosis not present

## 2021-06-25 DIAGNOSIS — C9000 Multiple myeloma not having achieved remission: Secondary | ICD-10-CM | POA: Diagnosis not present

## 2021-06-25 DIAGNOSIS — D696 Thrombocytopenia, unspecified: Secondary | ICD-10-CM | POA: Diagnosis not present

## 2021-06-25 DIAGNOSIS — Z5112 Encounter for antineoplastic immunotherapy: Secondary | ICD-10-CM | POA: Diagnosis not present

## 2021-06-25 DIAGNOSIS — D693 Immune thrombocytopenic purpura: Secondary | ICD-10-CM | POA: Diagnosis not present

## 2021-06-25 DIAGNOSIS — D649 Anemia, unspecified: Secondary | ICD-10-CM

## 2021-06-25 DIAGNOSIS — Z79899 Other long term (current) drug therapy: Secondary | ICD-10-CM | POA: Diagnosis not present

## 2021-06-25 LAB — COMPREHENSIVE METABOLIC PANEL
ALT: 25 U/L (ref 0–44)
AST: 20 U/L (ref 15–41)
Albumin: 3 g/dL — ABNORMAL LOW (ref 3.5–5.0)
Alkaline Phosphatase: 68 U/L (ref 38–126)
Anion gap: 5 (ref 5–15)
BUN: 20 mg/dL (ref 8–23)
CO2: 22 mmol/L (ref 22–32)
Calcium: 8.9 mg/dL (ref 8.9–10.3)
Chloride: 105 mmol/L (ref 98–111)
Creatinine, Ser: 0.74 mg/dL (ref 0.61–1.24)
GFR, Estimated: >60 mL/min (ref >60)
Glucose, Bld: 180 mg/dL — ABNORMAL HIGH (ref 70–99)
Potassium: 4 mmol/L (ref 3.5–5.1)
Sodium: 132 mmol/L — ABNORMAL LOW (ref 135–145)
Total Bilirubin: 0.8 mg/dL (ref 0.3–1.2)
Total Protein: 7.9 g/dL (ref 6.5–8.1)

## 2021-06-25 LAB — CBC WITH DIFFERENTIAL/PLATELET
Abs Immature Granulocytes: 0.06 10*3/uL (ref 0.00–0.07)
Basophils Absolute: 0 10*3/uL (ref 0.0–0.1)
Basophils Relative: 0 %
Eosinophils Absolute: 0 10*3/uL (ref 0.0–0.5)
Eosinophils Relative: 0 %
HCT: 24.6 % — ABNORMAL LOW (ref 39.0–52.0)
Hemoglobin: 7.8 g/dL — ABNORMAL LOW (ref 13.0–17.0)
Immature Granulocytes: 1 %
Lymphocytes Relative: 22 %
Lymphs Abs: 1.3 10*3/uL (ref 0.7–4.0)
MCH: 36.3 pg — ABNORMAL HIGH (ref 26.0–34.0)
MCHC: 31.7 g/dL (ref 30.0–36.0)
MCV: 114.4 fL — ABNORMAL HIGH (ref 80.0–100.0)
Monocytes Absolute: 2.4 10*3/uL — ABNORMAL HIGH (ref 0.1–1.0)
Monocytes Relative: 39 %
Neutro Abs: 2.4 10*3/uL (ref 1.7–7.7)
Neutrophils Relative %: 38 %
Platelets: 58 10*3/uL — ABNORMAL LOW (ref 150–400)
RBC: 2.15 MIL/uL — ABNORMAL LOW (ref 4.22–5.81)
RDW: 21.6 % — ABNORMAL HIGH (ref 11.5–15.5)
WBC: 6.2 10*3/uL (ref 4.0–10.5)
nRBC: 0.3 % — ABNORMAL HIGH (ref 0.0–0.2)

## 2021-06-25 LAB — SAMPLE TO BLOOD BANK

## 2021-06-25 MED ORDER — DEXAMETHASONE 4 MG PO TABS
10.0000 mg | ORAL_TABLET | Freq: Once | ORAL | Status: AC
  Administered 2021-06-25: 10 mg via ORAL
  Filled 2021-06-25: qty 3

## 2021-06-25 MED ORDER — BORTEZOMIB CHEMO SQ INJECTION 3.5 MG (2.5MG/ML)
1.0000 mg/m2 | Freq: Once | INTRAMUSCULAR | Status: AC
  Administered 2021-06-25: 1.75 mg via SUBCUTANEOUS
  Filled 2021-06-25: qty 0.7

## 2021-06-25 MED ORDER — ACYCLOVIR 400 MG PO TABS: 400.0000 mg | ORAL_TABLET | Freq: Two times a day (BID) | ORAL | 6 refills | Status: DC

## 2021-06-25 NOTE — Patient Instructions (Signed)
[facility]  Discharge Instructions: Thank you for choosing [facility] to provide your oncology and hematology care.  If you have a lab appointment with the Cancer Center, please come in thru the Main Entrance and check in at the main information desk.  Wear comfortable clothing and clothing appropriate for easy access to any Portacath or PICC line.   We strive to give you quality time with your provider. You may need to reschedule your appointment if you arrive late (15 or more minutes).  Arriving late affects you and other patients whose appointments are after yours.  Also, if you miss three or more appointments without notifying the office, you may be dismissed from the clinic at the provider's discretion.      For prescription refill requests, have your pharmacy contact our office and allow 72 hours for refills to be completed.        To help prevent nausea and vomiting after your treatment, we encourage you to take your nausea medication as directed.  BELOW ARE SYMPTOMS THAT SHOULD BE REPORTED IMMEDIATELY: *FEVER GREATER THAN 100.4 F (38 C) OR HIGHER *CHILLS OR SWEATING *NAUSEA AND VOMITING THAT IS NOT CONTROLLED WITH YOUR NAUSEA MEDICATION *UNUSUAL SHORTNESS OF BREATH *UNUSUAL BRUISING OR BLEEDING *URINARY PROBLEMS (pain or burning when urinating, or frequent urination) *BOWEL PROBLEMS (unusual diarrhea, constipation, pain near the anus) TENDERNESS IN MOUTH AND THROAT WITH OR WITHOUT PRESENCE OF ULCERS (sore throat, sores in mouth, or a toothache) UNUSUAL RASH, SWELLING OR PAIN  UNUSUAL VAGINAL DISCHARGE OR ITCHING   Items with * indicate a potential emergency and should be followed up as soon as possible or go to the Emergency Department if any problems should occur.  Please show the CHEMOTHERAPY ALERT CARD or IMMUNOTHERAPY ALERT CARD at check-in to the Emergency Department and triage nurse.  Should you have questions after your visit or need to cancel  or reschedule your appointment, please contact [facility] [phone]  and follow the prompts.  Office hours are 8:00 a.m. to 4:30 p.m. Monday - Friday. Please note that voicemails left after 4:00 p.m. may not be returned until the following business day.  We are closed weekends and major holidays. You have access to a nurse at all times for urgent questions. Please call the main number to the clinic [phone] and follow the prompts.  For any non-urgent questions, you may also contact your provider using MyChart. We now offer e-Visits for anyone 18 and older to request care online for non-urgent symptoms. For details visit [email or web address].   Also download the MyChart app! Go to the app store, search "MyChart", open the app, select [facility], and log in with your MyChart username and password.  Due to Covid, a mask is required upon entering the hospital/clinic. If you do not have a mask, one will be given to you upon arrival. For doctor visits, patients may have 1 support person aged 18 or older with them. For treatment visits, patients cannot have anyone with them due to current Covid guidelines and our immunocompromised population.  

## 2021-06-25 NOTE — Progress Notes (Signed)
Patient presents today for Velcade injection.  Patient is in satisfactory condition with no complaints voiced.  Vital signs are stable.  Labs reviewed by Dr. Delton Coombes during his office visit.  We will proceed with injection per MD orders.  Patient tolerated Velcade injection with no complaints voiced.  Site clean and dry with no bruising or swelling noted.  No complaints of pain.  Discharged with vital signs stable and no signs or symptoms of distress noted.

## 2021-06-25 NOTE — Progress Notes (Signed)
Pharmacist Chemotherapy Monitoring - Initial Assessment    Anticipated start date: 06/25/21   The following has been reviewed per standard work regarding the patient's treatment regimen: The patient's diagnosis, treatment plan and drug doses, and organ/hematologic function Lab orders and baseline tests specific to treatment regimen  The treatment plan start date, drug sequencing, and pre-medications Prior authorization status  Patient's documented medication list, including drug-drug interaction screen and prescriptions for anti-emetics and supportive care specific to the treatment regimen The drug concentrations, fluid compatibility, administration routes, and timing of the medications to be used The patient's access for treatment and lifetime cumulative dose history, if applicable  The patient's medication allergies and previous infusion related reactions, if applicable   Changes made to treatment plan:  N/A  Follow up needed:  N/A   Dale [name], [facility], 06/25/2021  3:59 PM

## 2021-06-25 NOTE — Progress Notes (Signed)
Dale [name], [place] [address]   CLINIC:  Medical Oncology/Hematology  PCP:  [facility], [place] / [place] [address]  [phone]  REASON FOR VISIT:  Follow-up for multiple myeloma and anemia  PRIOR THERAPY: none  CURRENT THERAPY: surveillance  INTERVAL HISTORY:  [name], a 88 y.o. male, returns for routine follow-up for his multiple myeloma and anemia. Dale [name] was last seen on 06/07/2021.  Today he reports feeling good, and he is accompanied by a friend. He stopped taking Revlimid. He continues to take dexamethasone. His energy is stable. He reports occasional CP when laying on his left side, and he denies lightheadedness. He denies dizziness upon standing. He reports a history of shingles.   REVIEW OF SYSTEMS:  Review of Systems  Constitutional:  Negative for appetite change and fatigue.  HENT:   Positive for trouble swallowing.   Cardiovascular:  Positive for chest pain.  Neurological:  Negative for dizziness and light-headedness.  All other systems reviewed and are negative.  PAST MEDICAL/SURGICAL HISTORY:  Past Medical History:  Diagnosis Date   Allergic rhinitis    Anal fissure    Anginal pain (HCC)    Asthma    Cancer (HCC)    skin   Chest pain    CHF (congestive heart failure) ([facility])    Coronary heart disease    s/p stenting. cath in 01/2012 noncritical occlusion   Dysrhythmia    1st degree heart block   GERD (gastroesophageal reflux disease)    Glaucoma    Hiatal hernia    Hyperlipidemia    Hypertension    Hypothyroidism    Idiopathic thrombocytopenic purpura ([facility]) 2002   Macular degeneration    Nephrolithiasis    PUD (peptic ulcer disease)    remote   Sarcoidosis    pulmonary   Schatzki's ring    Past Surgical History:  Procedure Laterality Date   cardiac stents     COLONOSCOPY  10/30/2006   Normal rectum, sigmoid diverticula.Remainder of colonic mucosa appeared normal.    CORONARY ANGIOPLASTY WITH STENT PLACEMENT     about 10 years ago per pt (around 2007)   [place], URETEROSCOPY AND STENT PLACEMENT Left 06/16/2017   Procedure: CYSTOSCOPY WITH RETROGRADE PYELOGRAM, URETEROSCOPY,STONE EXTRACTION  AND STENT PLACEMENT;  Surgeon: Franchot Gallo, MD;  Location: [facility];  Service: Urology;  Laterality: Left;   ESOPHAGOGASTRODUODENOSCOPY  06/19/2004   Two esophageal rings and esophageal web as described above.  All of these were disrupted by passing 56-French Venia Minks dilator/ Candida esophagitis,which appears to be incidental given history of   antibiotic use, but nevertheless will be treated.   ESOPHAGOGASTRODUODENOSCOPY  10/30/2006   Distal tandem esophageal ring status post dilation disruption as  described above.  Otherwise normal esophagus/  Small hiatal hernia otherwise normal stomach, D1 and D2   ESOPHAGOGASTRODUODENOSCOPY N/A 03/22/2015   Dr.Rourk- noncritical schatzki's ring and hiatal hernia-o/w normal EGD.    ESOPHAGOGASTRODUODENOSCOPY (EGD) WITH ESOPHAGEAL DILATION  03/04/2012   RMR- schatzki's ring, hiatal hernia, polypoid gastric mucosa, bx= minimally active gastritis.   HOLMIUM LASER APPLICATION Left 4/49/6759   Procedure: HOLMIUM LASER APPLICATION;  Surgeon: Franchot Gallo, MD;  Location: [facility];  Service: Urology;  Laterality: Left;   IR GENERIC HISTORICAL  03/06/2016   IR RADIOLOGIST EVAL & MGMT 03/06/2016 Aletta Edouard, MD [facility] INTERV RAD   IR GENERIC HISTORICAL  06/18/2016   IR RADIOLOGIST EVAL & MGMT 06/18/2016 Aletta Edouard, MD  [facility] INTERV RAD   IR RADIOLOGIST EVAL & MGMT  10/01/2016   IR RADIOLOGIST EVAL & MGMT  10/15/2017   IR RADIOLOGIST EVAL & MGMT  12/24/2018   IR RADIOLOGIST EVAL & MGMT  01/04/2020   IR RADIOLOGIST EVAL & MGMT  06/20/2021   LEFT HEART CATH N/A 02/02/2012   Procedure: LEFT HEART CATH;  Surgeon: Lorretta Harp, MD;  Location: [facility] CATH LAB;  Service: Cardiovascular;  Laterality: N/A;    MEDIASTINOSCOPY     for dx sarcoid   RADIOLOGY WITH ANESTHESIA Left 05/17/2016   Procedure: left renal ablation;  Surgeon: Aletta Edouard, MD;  Location: [facility];  Service: Radiology;  Laterality: Left;    SOCIAL HISTORY:  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    Employer: RETIRED  Tobacco Use   Smoking status: Former    Packs/day: 0.10    Years: 2.00    Pack years: 0.20    Types: Cigarettes, Cigars    Quit date: 05/06/1970    Years since quitting: 51.1   Smokeless tobacco: Never  Vaping Use   Vaping Use: Never used  Substance and Sexual Activity   Alcohol use: No    Alcohol/week: 0.0 standard drinks   Drug use: No   Sexual activity: Never  Other Topics Concern   Not on file  Social History Narrative   Not on file   Social Determinants of Health   Financial Resource Strain: Not on file  Food Insecurity: Not on file  Transportation Needs: Not on file  Physical Activity: Not on file  Stress: Not on file  Social Connections: Not on file  Intimate Partner Violence: Not on file    FAMILY HISTORY:  Family History  Problem Relation Age of Onset   Heart disease Father        deceased age 97   Stroke Mother    Alzheimer's disease Mother    Heart attack Brother        deceased age 66   Cancer Other        niece   Colon cancer Neg Hx     CURRENT MEDICATIONS:  Current Outpatient Medications  Medication Sig Dispense Refill   acyclovir (ZOVIRAX) 400 MG tablet Take 1 tablet (400 mg total) by mouth 2 (two) times daily. 60 tablet 6   aspirin 81 MG tablet Take 81 mg by mouth every Monday, Wednesday, and Friday.     atorvastatin (LIPITOR) 40 MG tablet TAKE 1 TABLET BY MOUTH IN  THE EVENING (Patient taking differently: Take 40 mg by mouth  every evening.) 90 tablet 3   atropine 1 % ophthalmic solution      brimonidine (ALPHAGAN) 0.2 % ophthalmic solution Place 1 drop into both eyes 2 (two) times daily.     clopidogrel (PLAVIX) 75 MG tablet TAKE 1 TABLET BY MOUTH  DAILY 90 tablet 3   dorzolamide-timolol (COSOPT) 22.3-6.8 MG/ML ophthalmic solution Place 1 drop into both eyes 2 (two) times daily.     fluocinonide cream (LIDEX) 0.96 % Apply 1 application topically 2 (two) times daily.     furosemide (LASIX) 20 MG tablet If you notice any swelling, you may take 40 mg (two tablets) for the day. (  Patient taking differently: 40 mg daily as needed for fluid.) 90 tablet 3   isosorbide mononitrate (IMDUR) 60 MG 24 hr tablet Take 60 mg (one tablet) in the morning and 30 mg (one-half tablet) in the evening. 180 tablet 2   levothyroxine (SYNTHROID) 100 MCG tablet Take 100 mcg by mouth daily before breakfast.     losartan (COZAAR) 100 MG tablet TAKE 1 TABLET BY MOUTH  DAILY 90 tablet 2   metoCLOPramide (REGLAN) 10 MG tablet Take 1 tablet (10 mg total) by mouth every 6 (six) hours as needed for nausea or vomiting. 15 tablet 0   metoprolol tartrate (LOPRESSOR) 25 MG tablet TAKE 1 TABLET BY MOUTH IN  THE MORNING AND 1/2 TABLET  IN THE EVENING (Patient taking differently: Take 12.5-25 mg by mouth See admin instructions. 25 mg in the morning  12.5 mg in the evening) 135 tablet 3   mometasone (ELOCON) 0.1 % ointment Apply 1 application topically daily.      Multiple Vitamins-Minerals (PRESERVISION/LUTEIN) CAPS Take 1 capsule by mouth 2 (two) times daily.     nitroGLYCERIN (NITROSTAT) 0.4 MG SL tablet Place 1 tablet (0.4 mg total) under the tongue every 5 (five) minutes as needed. For chest pain 25 tablet 2   pantoprazole (PROTONIX) 40 MG tablet TAKE 1 TABLET BY MOUTH  DAILY BEFORE BREAKFAST (Patient taking differently: Take 40 mg by mouth daily.) 90 tablet 3   potassium chloride SA (KLOR-CON M) 20 MEQ tablet TAKE 1 TABLET BY MOUTH  DAILY 90 tablet 3    prochlorperazine (COMPAZINE) 10 MG tablet Take 1 tablet (10 mg total) by mouth every 6 (six) hours as needed for nausea or vomiting. 30 tablet 3   ROCKLATAN 0.02-0.005 % SOLN Apply 1 drop to eye at bedtime.     triamcinolone (KENALOG) 0.1 % cream Apply 1 application topically 2 (two) times daily as needed (for irritation).     vitamin C (ASCORBIC ACID) 500 MG tablet Take 500 mg by mouth daily.     vitamin E 200 UNIT capsule Take 200 Units by mouth daily at 12 noon.      dexamethasone (DECADRON) 2 MG tablet TAKE 5 TABLETS(10 MG) BY MOUTH 1 TIME A WEEK (Patient not taking: Reported on 06/25/2021) 20 tablet 3   lenalidomide (REVLIMID) 5 MG capsule Take 1 capsule (5 mg total) by mouth daily. 14 days on, 14 days off (Patient not taking: Reported on 06/25/2021) 14 capsule 0   No current facility-administered medications for this visit.    ALLERGIES:  Allergies  Allergen Reactions   Azithromycin Other (See Comments)    Sore mouth and fever blisters around mouth, sores in nose area as well   Doxazosin Shortness Of Breath   Acetaminophen Other (See Comments)    REACTION: UNKNOWN REACTION   Atenolol Other (See Comments)    REACTION: UNKNOWN REACTION   Hydrocodone Nausea And Vomiting   Hydrocodone-Acetaminophen Nausea Only   Levofloxacin Other (See Comments)    Caused stomach problems.   Morphine Other (See Comments)    "made me crazy"   Penicillins Nausea And Vomiting and Other (See Comments)    Has patient had a PCN reaction causing immediate rash, facial/tongue/throat swelling, SOB or lightheadedness with hypotension: No Has patient had a PCN reaction causing severe rash involving mucus membranes or skin necrosis: No Has patient had a PCN reaction that required hospitalization No Has patient had a PCN reaction occurring within the last 10 years: No If all of the above answers  are "NO", then may proceed with Cephalosporin use.    Sulfonamide Derivatives Nausea And Vomiting    PHYSICAL EXAM:   Performance status (ECOG): 0 - Asymptomatic  Vitals:   06/25/21 1501  BP: (!) 160/63  Pulse: 75  Resp: 20  Temp: 98.3 F (36.8 C)  SpO2: 97%   Wt Readings from Last 3 Encounters:  06/25/21 128 lb 4 oz (58.2 kg)  06/07/21 129 lb 8 oz (58.7 kg)  05/23/21 130 lb (59 kg)   Physical Exam Vitals reviewed.  Constitutional:      Appearance: Normal appearance.  Cardiovascular:     Rate and Rhythm: Normal rate and regular rhythm.     Pulses: Normal pulses.     Heart sounds: Normal heart sounds.  Pulmonary:     Effort: Pulmonary effort is normal.     Breath sounds: Normal breath sounds.  Neurological:     General: No focal deficit present.     Mental Status: He is alert and oriented to person, place, and time.  Psychiatric:        Mood and Affect: Mood normal.        Behavior: Behavior normal.    LABORATORY DATA:  I have reviewed the labs as listed.  CBC Latest Ref Rng & Units 06/25/2021 06/14/2021 06/07/2021  WBC 4.0 - 10.5 K/uL 6.2 3.1(L) 4.5  Hemoglobin 13.0 - 17.0 g/dL 7.8(L) 7.9(L) 6.1(LL)  Hematocrit 39.0 - 52.0 % 24.6(L) 25.2(L) 18.9(L)  Platelets 150 - 400 K/uL 58(L) 34(L) 70(L)   CMP Latest Ref Rng & Units 06/25/2021 06/07/2021 05/23/2021  Glucose 70 - 99 mg/dL 180(H) 126(H) 122(H)  BUN 8 - 23 mg/dL '20 22 23  ' Creatinine 0.61 - 1.24 mg/dL 0.74 0.85 0.74  Sodium 135 - 145 mmol/L 132(L) 132(L) 136  Potassium 3.5 - 5.1 mmol/L 4.0 3.7 3.7  Chloride 98 - 111 mmol/L 105 105 106  CO2 22 - 32 mmol/L '22 24 23  ' Calcium 8.9 - 10.3 mg/dL 8.9 8.7(L) 9.0  Total Protein 6.5 - 8.1 g/dL 7.9 7.6 8.6(H)  Total Bilirubin 0.3 - 1.2 mg/dL 0.8 0.5 0.9  Alkaline Phos 38 - 126 U/L 68 80 76  AST 15 - 41 U/L '20 17 19  ' ALT 0 - 44 U/L '25 17 21      ' Component Value Date/Time   RBC 2.15 (L) 06/25/2021 1338   MCV 114.4 (H) 06/25/2021 1338   MCV 107 (H) 10/26/2019 1013   MCH 36.3 (H) 06/25/2021 1338   MCHC 31.7 06/25/2021 1338   RDW 21.6 (H) 06/25/2021 1338   RDW 12.7 10/26/2019 1013    LYMPHSABS 1.3 06/25/2021 1338   LYMPHSABS CANCELED 09/08/2015 0933   MONOABS 2.4 (H) 06/25/2021 1338   EOSABS 0.0 06/25/2021 1338   EOSABS CANCELED 09/08/2015 0933   BASOSABS 0.0 06/25/2021 1338   BASOSABS CANCELED 09/08/2015 0933    DIAGNOSTIC IMAGING:  I have independently reviewed the scans and discussed with the patient. CT ABDOMEN W WO CONTRAST  Result Date: 06/15/2021 CLINICAL DATA:  Follow-up left renal mass, status post cryoablation, history of multiple myeloma, ongoing chemotherapy EXAM: CT ABDOMEN WITHOUT AND WITH CONTRAST TECHNIQUE: Multidetector CT imaging of the abdomen was performed following the standard protocol before and following the bolus administration of intravenous contrast. RADIATION DOSE REDUCTION: This exam was performed according to the departmental dose-optimization program which includes automated exposure control, adjustment of the mA and/or kV according to patient size and/or use of iterative reconstruction technique. CONTRAST:  178m OMNIPAQUE IOHEXOL  300 MG/ML  SOLN COMPARISON:  CT abdomen pelvis, 10/09/2020 FINDINGS: Lower chest: No acute abnormality.  Coronary artery calcifications. Hepatobiliary: No solid liver abnormality is seen. Simple cysts of the liver. Small gallstone in the dependent gallbladder. Gallbladder wall thickening, or biliary dilatation. Pancreas: Unremarkable. No pancreatic ductal dilatation or surrounding inflammatory changes. Spleen: Normal in size without significant abnormality. Adrenals/Urinary Tract: Adrenal glands are unremarkable. Redemonstrated post procedural findings of percutaneous ablation of the peripheral inferior pole of the left kidney. An arterially hyperenhancing nodule at the anterior aspect of the ablation site has increased in size, measuring 0.7 x 0.7 cm, previously 0.3 cm (series 6, image 37). Additional subcentimeter bilateral low-attenuation lesions, most likely tiny cysts and unchanged without evidence of other suspicious  contrast enhancement. Small bilateral nonobstructive renal calculi. No hydronephrosis. Bladder is unremarkable. Stomach/Bowel: Stomach is within normal limits. Appendix appears normal. No evidence of bowel wall thickening, distention, or inflammatory changes. Vascular/Lymphatic: Aortic atherosclerosis. No enlarged abdominal lymph nodes. Other: No abdominal wall hernia or abnormality. No ascites. Musculoskeletal: No acute or significant osseous findings. IMPRESSION: 1. Redemonstrated post procedural findings of percutaneous ablation of the peripheral inferior pole of the left kidney. An arterially hyperenhancing nodule at the anterior aspect of the ablation site has increased in size, measuring 0.7 x 0.7 cm, previously 0.3 cm, consistent with locally recurrent malignancy. 2. No evidence of renal vein invasion, lymphadenopathy or metastatic disease in the abdomen or pelvis at this time. 3. Nonobstructive bilateral nephrolithiasis. 4. Cholelithiasis. 5. Coronary artery disease. These results will be called to the ordering clinician or representative by the Radiologist Assistant, and communication documented in the PACS or [employer]. Aortic Atherosclerosis (ICD10-I70.0). Electronically Signed   By: Delanna Ahmadi M.D.   On: 06/15/2021 09:54   IR Radiologist Eval & Mgmt  Result Date: 06/20/2021 Please refer to notes tab for details about interventional procedure. (Op Note)    ASSESSMENT:  1.  IgG lambda plasma cell myeloma: - Work-up for macrocytic anemia on 12/01/2020 showed M spike of 2.9 g.  Immunofixation IgG lambda. - Lambda light chains elevated at 284.  Light chain ratio was 0.04.  LDH was 163.  Creatinine was 0.9 and calcium 8.9. - Bone marrow biopsy on 12/18/2020-hypercellular marrow for age with 48% atypical plasma cells with lambda light chain restriction. - Chromosome analysis was normal. - Myeloma FISH panel-loss of long-arm of chromosome 13, gain of 1q, t(14;16) - Skeletal survey was  negative for lytic lesions. - Revlimid 5 mg, 2 weeks on/1 week off started on 01/20/2021.  Dexamethasone weekly 10 mg added on 02/07/2021.  Revlimid is discontinued around 06/15/2021 due to poor tolerance and ineffectiveness at low-dose.  Thrombocytopenia and anemia.  2.  Macrocytic anemia: - CBC on 12/01/2020 with hemoglobin 8.5 and MCV of 117. - Denies any bleeding per rectum or melena.  3.  Social/family history: - Lives at home with his wife.  He does all ADLs and IADLs.  He even does yard work. - He worked at [employer] for 35 years.  Denies any chemical exposure.  Non-smoker. - No family history of malignancies.   PLAN:  1.  IgG lambda plasma cell myeloma, high risk: - He has left-sided chest pain only when he lies on the left side.  He denies any dizziness. - We talked about myeloma panel from 06/07/2021 which showed M spike increased to 2.8 from 2.1 g.  Lambda light chains also increased to 582 with ratio of 0.03. - We have discontinued Revlimid as he is  not able to tolerate higher doses more than 5 mg. - We talked about starting him on Velcade 1 mg per metered square weekly 3 weeks on/1 week off along with dexamethasone 10 mg weekly on days of Velcade. - We talked about side effects in detail including peripheral neuropathy and cytopenias.  We will titrate the dose as tolerated. - Reviewed labs today which showed white count 6.2 with ANC normal.  Platelet count is 58.  Hemoglobin 7.8.  LFTs are normal.  Creatinine is normal. - We will start his first injection today. - We will see him back in 2 weeks to see how he is tolerating.  2.  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  Last blood transfusion on 06/07/2021.  3.  Thromboprophylaxis: -Plavix was stopped on 06/06/2021 because of rectal bleeding. - His platelet count today is 58.  Continue to hold Plavix.  4.  Right ankle swelling: - Continue Lasix as needed.  5.  ID prophylaxis: - We will start him  on acyclovir twice daily for shingles prophylaxis.  Orders placed this encounter:  No orders of the defined types were placed in this encounter.    Derek Jack, MD [place] [phone]   I, Thana Ates, am acting as a scribe for Dr. Derek Jack.  I, Derek Jack MD, have reviewed the above documentation for accuracy and completeness, and I agree with the above.

## 2021-06-25 NOTE — Progress Notes (Signed)
Patient presents today for Velcade injection.  Patient is in satisfactory condition with no complaints voiced.  Vital signs are stable.  Labs reviewed by Dr. Delton Coombes during his office visit.

## 2021-06-25 NOTE — Progress Notes (Signed)
Orders for dexamethasone 10 mg po x 1 today prior to Velcade.  T.O. Dr Rhys Martini, PharmD

## 2021-06-25 NOTE — Progress Notes (Signed)
Patient has been assessed, vital signs and labs have been reviewed by Dr. Delton Coombes. ANC, Creatinine, LFTs, and Platelets are within treatment parameters per Dr. Delton Coombes. The patient is good to proceed with Velcade treatment at this time only needs to take Dexamethasone 10 mg today per Dr. Raliegh Ip since he took 10 mg on Sunday.  Primary RN and pharmacy aware.

## 2021-06-25 NOTE — Patient Instructions (Signed)
[place] at [facility] Discharge Instructions  You were seen and examined today by Dr. Delton Coombes. He reviewed your most recent labs and everything looks okay. Start taking the dexamethasone 5 pills on day that you get your injection. Dr. Delton Coombes sent in acyclovir to help prevent Shingles since the injection can make you more at risk of getting them. Please keep follow up appointments as scheduled.   Thank you for choosing [place] at [facility] to provide your oncology and hematology care.  To afford each patient quality time with our provider, please arrive at least 15 minutes before your scheduled appointment time.   If you have a lab appointment with the [facility] please come in thru the Main Entrance and check in at the main information desk.  You need to re-schedule your appointment should you arrive 10 or more minutes late.  We strive to give you quality time with our providers, and arriving late affects you and other patients whose appointments are after yours.  Also, if you no show three or more times for appointments you may be dismissed from the clinic at the providers discretion.     Again, thank you for choosing [facility].  Our hope is that these requests will decrease the amount of time that you wait before being seen by our physicians.       _____________________________________________________________  Should you have questions after your visit to [facility], please contact our office at [phone] and follow the prompts.  Our office hours are 8:00 a.m. and 4:30 p.m. Monday - Friday.  Please note that voicemails left after 4:00 p.m. may not be returned until the following business day.  We are closed weekends and major holidays.  You do have access to a nurse 24-7, just call the main number to the clinic [phone] and do not press any options, hold on the line and a nurse will answer the  phone.    For prescription refill requests, have your pharmacy contact our office and allow 72 hours.    Due to Covid, you will need to wear a mask upon entering the hospital. If you do not have a mask, a mask will be given to you at the Main Entrance upon arrival. For doctor visits, patients may have 1 support person age 32 or older with them. For treatment visits, patients can not have anyone with them due to social distancing guidelines and our immunocompromised population.

## 2021-06-25 NOTE — Progress Notes (Signed)
START ON PATHWAY REGIMEN - Multiple Myeloma and Other Plasma Cell Dyscrasias     A cycle is every 21 days:     Bortezomib      Lenalidomide      Dexamethasone   **Always confirm dose/schedule in your pharmacy ordering system**  Patient Characteristics: Multiple Myeloma, Newly Diagnosed, Transplant Ineligible or Refused, High Risk Disease Classification: Multiple Myeloma R-ISS Staging: II Therapeutic Status: Newly Diagnosed Is Patient Eligible for Transplant<= Transplant Ineligible or Refused Risk Status: High Risk Intent of Therapy: Non-Curative / Palliative Intent, Discussed with Patient

## 2021-06-30 ENCOUNTER — Other Ambulatory Visit: Payer: Self-pay | Admitting: Cardiovascular Disease

## 2021-07-02 ENCOUNTER — Inpatient Hospital Stay (HOSPITAL_COMMUNITY): Payer: Medicare Other

## 2021-07-02 ENCOUNTER — Other Ambulatory Visit: Payer: Self-pay

## 2021-07-02 VITALS — BP 143/52 | HR 71 | Temp 98.1°F | Resp 18 | Ht 67.0 in | Wt 126.0 lb

## 2021-07-02 DIAGNOSIS — D630 Anemia in neoplastic disease: Secondary | ICD-10-CM | POA: Diagnosis not present

## 2021-07-02 DIAGNOSIS — C9000 Multiple myeloma not having achieved remission: Secondary | ICD-10-CM | POA: Diagnosis not present

## 2021-07-02 DIAGNOSIS — Z79899 Other long term (current) drug therapy: Secondary | ICD-10-CM | POA: Diagnosis not present

## 2021-07-02 DIAGNOSIS — D649 Anemia, unspecified: Secondary | ICD-10-CM

## 2021-07-02 DIAGNOSIS — D696 Thrombocytopenia, unspecified: Secondary | ICD-10-CM

## 2021-07-02 DIAGNOSIS — Z5112 Encounter for antineoplastic immunotherapy: Secondary | ICD-10-CM | POA: Diagnosis not present

## 2021-07-02 DIAGNOSIS — D693 Immune thrombocytopenic purpura: Secondary | ICD-10-CM | POA: Diagnosis not present

## 2021-07-02 LAB — CBC WITH DIFFERENTIAL/PLATELET
Abs Immature Granulocytes: 0.05 10*3/uL (ref 0.00–0.07)
Basophils Absolute: 0 10*3/uL (ref 0.0–0.1)
Basophils Relative: 0 %
Eosinophils Absolute: 0 10*3/uL (ref 0.0–0.5)
Eosinophils Relative: 0 %
HCT: 23.3 % — ABNORMAL LOW (ref 39.0–52.0)
Hemoglobin: 7.3 g/dL — ABNORMAL LOW (ref 13.0–17.0)
Immature Granulocytes: 1 %
Lymphocytes Relative: 15 %
Lymphs Abs: 1.2 10*3/uL (ref 0.7–4.0)
MCH: 35.6 pg — ABNORMAL HIGH (ref 26.0–34.0)
MCHC: 31.3 g/dL (ref 30.0–36.0)
MCV: 113.7 fL — ABNORMAL HIGH (ref 80.0–100.0)
Monocytes Absolute: 3.3 10*3/uL — ABNORMAL HIGH (ref 0.1–1.0)
Monocytes Relative: 42 %
Neutro Abs: 3.4 10*3/uL (ref 1.7–7.7)
Neutrophils Relative %: 42 %
Platelets: 91 10*3/uL — ABNORMAL LOW (ref 150–400)
RBC: 2.05 MIL/uL — ABNORMAL LOW (ref 4.22–5.81)
RDW: 21.4 % — ABNORMAL HIGH (ref 11.5–15.5)
WBC: 7.9 10*3/uL (ref 4.0–10.5)
nRBC: 0 % (ref 0.0–0.2)

## 2021-07-02 LAB — COMPREHENSIVE METABOLIC PANEL
ALT: 19 U/L (ref 0–44)
AST: 18 U/L (ref 15–41)
Albumin: 2.8 g/dL — ABNORMAL LOW (ref 3.5–5.0)
Alkaline Phosphatase: 72 U/L (ref 38–126)
Anion gap: 3 — ABNORMAL LOW (ref 5–15)
BUN: 22 mg/dL (ref 8–23)
CO2: 22 mmol/L (ref 22–32)
Calcium: 8.8 mg/dL — ABNORMAL LOW (ref 8.9–10.3)
Chloride: 105 mmol/L (ref 98–111)
Creatinine, Ser: 0.8 mg/dL (ref 0.61–1.24)
GFR, Estimated: >60 mL/min (ref >60)
Glucose, Bld: 214 mg/dL — ABNORMAL HIGH (ref 70–99)
Potassium: 3.9 mmol/L (ref 3.5–5.1)
Sodium: 130 mmol/L — ABNORMAL LOW (ref 135–145)
Total Bilirubin: 0.7 mg/dL (ref 0.3–1.2)
Total Protein: 8.4 g/dL — ABNORMAL HIGH (ref 6.5–8.1)

## 2021-07-02 LAB — SAMPLE TO BLOOD BANK

## 2021-07-02 LAB — MAGNESIUM: Magnesium: 2 mg/dL (ref 1.7–2.4)

## 2021-07-02 MED ORDER — BORTEZOMIB CHEMO SQ INJECTION 3.5 MG (2.5MG/ML)
1.0000 mg/m2 | Freq: Once | INTRAMUSCULAR | Status: AC
  Administered 2021-07-02: 1.75 mg via SUBCUTANEOUS
  Filled 2021-07-02: qty 0.7

## 2021-07-02 NOTE — Progress Notes (Signed)
Pt presents today for Velcade injection per provider's order. Vital signs stable and pt voiced no new complaints at this time. Platelets are 91 MD made aware.Okay to proceed with treatment today per Dr.K.  Velcade injection given today per MD orders. Tolerated infusion without adverse affects. Vital signs stable. No complaints at this time. Discharged from clinic ambulatory in stable condition. Alert and oriented x 3. F/U with [facility] as scheduled.

## 2021-07-02 NOTE — Patient Instructions (Signed)
[place]  Discharge Instructions: Thank you for choosing [facility] to provide your oncology and hematology care.  If you have a lab appointment with the [facility], please come in thru the Main Entrance and check in at the main information desk.  Wear comfortable clothing and clothing appropriate for easy access to any Portacath or PICC line.   We strive to give you quality time with your provider. You may need to reschedule your appointment if you arrive late (15 or more minutes).  Arriving late affects you and other patients whose appointments are after yours.  Also, if you miss three or more appointments without notifying the office, you may be dismissed from the clinic at the providers discretion.      For prescription refill requests, have your pharmacy contact our office and allow 72 hours for refills to be completed.    Today you received the following chemotherapy and/or immunotherapy agents Velcade injection.   To help prevent nausea and vomiting after your treatment, we encourage you to take your nausea medication as directed.  BELOW ARE SYMPTOMS THAT SHOULD BE REPORTED IMMEDIATELY: *FEVER GREATER THAN 100.4 F (38 C) OR HIGHER *CHILLS OR SWEATING *NAUSEA AND VOMITING THAT IS NOT CONTROLLED WITH YOUR NAUSEA MEDICATION *UNUSUAL SHORTNESS OF BREATH *UNUSUAL BRUISING OR BLEEDING *URINARY PROBLEMS (pain or burning when urinating, or frequent urination) *BOWEL PROBLEMS (unusual diarrhea, constipation, pain near the anus) TENDERNESS IN MOUTH AND THROAT WITH OR WITHOUT PRESENCE OF ULCERS (sore throat, sores in mouth, or a toothache) UNUSUAL RASH, SWELLING OR PAIN  UNUSUAL VAGINAL DISCHARGE OR ITCHING   Items with * indicate a potential emergency and should be followed up as soon as possible or go to the Emergency Department if any problems should occur.  Please show the CHEMOTHERAPY ALERT CARD or IMMUNOTHERAPY ALERT CARD at check-in to the Emergency  Department and triage nurse.  Should you have questions after your visit or need to cancel or reschedule your appointment, please contact [facility] [phone]  and follow the prompts.  Office hours are 8:00 a.m. to 4:30 p.m. Monday - Friday. Please note that voicemails left after 4:00 p.m. may not be returned until the following business day.  We are closed weekends and major holidays. You have access to a nurse at all times for urgent questions. Please call the main number to the clinic [phone] and follow the prompts.  For any non-urgent questions, you may also contact your provider using MyChart. We now offer e-Visits for anyone 51 and older to request care online for non-urgent symptoms. For details visit [email or web address].   Also download the MyChart app! Go to the app store, search "MyChart", open the app, select [place] [facility], and log in with your MyChart username and password.  Due to Covid, a mask is required upon entering the hospital/clinic. If you do not have a mask, one will be given to you upon arrival. For doctor visits, patients may have 1 support person aged 71 or older with them. For treatment visits, patients cannot have anyone with them due to current Covid guidelines and our immunocompromised population.

## 2021-07-09 ENCOUNTER — Ambulatory Visit (HOSPITAL_COMMUNITY): Payer: Medicare Other | Admitting: Hematology

## 2021-07-09 ENCOUNTER — Ambulatory Visit (HOSPITAL_COMMUNITY): Payer: Medicare Other

## 2021-07-09 ENCOUNTER — Other Ambulatory Visit (HOSPITAL_COMMUNITY): Payer: Medicare Other

## 2021-07-09 DIAGNOSIS — H401121 Primary open-angle glaucoma, left eye, mild stage: Secondary | ICD-10-CM | POA: Diagnosis not present

## 2021-07-09 DIAGNOSIS — Z961 Presence of intraocular lens: Secondary | ICD-10-CM | POA: Diagnosis not present

## 2021-07-09 DIAGNOSIS — H401113 Primary open-angle glaucoma, right eye, severe stage: Secondary | ICD-10-CM | POA: Diagnosis not present

## 2021-07-11 ENCOUNTER — Other Ambulatory Visit: Payer: Self-pay

## 2021-07-11 ENCOUNTER — Encounter: Payer: Self-pay | Admitting: Physician Assistant

## 2021-07-11 ENCOUNTER — Ambulatory Visit (INDEPENDENT_AMBULATORY_CARE_PROVIDER_SITE_OTHER): Payer: Medicare Other | Admitting: Physician Assistant

## 2021-07-11 VITALS — BP 160/70 | HR 62 | Ht 68.0 in | Wt 126.8 lb

## 2021-07-11 DIAGNOSIS — I10 Essential (primary) hypertension: Secondary | ICD-10-CM | POA: Diagnosis not present

## 2021-07-11 DIAGNOSIS — I25119 Atherosclerotic heart disease of native coronary artery with unspecified angina pectoris: Secondary | ICD-10-CM | POA: Diagnosis not present

## 2021-07-11 DIAGNOSIS — E039 Hypothyroidism, unspecified: Secondary | ICD-10-CM | POA: Diagnosis not present

## 2021-07-11 DIAGNOSIS — I2510 Atherosclerotic heart disease of native coronary artery without angina pectoris: Secondary | ICD-10-CM

## 2021-07-11 DIAGNOSIS — C9000 Multiple myeloma not having achieved remission: Secondary | ICD-10-CM | POA: Diagnosis not present

## 2021-07-11 DIAGNOSIS — E785 Hyperlipidemia, unspecified: Secondary | ICD-10-CM

## 2021-07-11 NOTE — Progress Notes (Signed)
Cardiology Office Note:    Date:  07/13/2021   ID:  Dale [name], DOB [birth year], MRN [id number]  PCP:  Redmond School, MD   [facility] Providers Cardiologist:  Shelva Majestic, MD     Referring MD: Redmond School, MD   Chief Complaint  Patient presents with   Follow-up    Seen for [name]    History of Present Illness:    Dale [name] is a 88 y.o. male with a hx of CAD, HTN, HLD, hypothyroidism, renal cell cancer, syncope, plasma cell myeloma, ITP and history of peptic ulcer disease.  Patient had initial intervention to RCA in 2000.  He also received a Cypher stent beyond acute marginal of RCA in 2009.  Cardiac catheterization in September 2013 showed a 30% LAD narrowing after the second diagonal, otherwise normal left circumflex artery and a patent RCA stent.  Previous P2Y12 study shows that he response to Plavix therapy.  Echocardiogram in August 2018 showed EF 55 to 60%.  He was seen at [facility] near the end of August 2018 with chest pain.  Myoview at the time showed no reversible ischemia.  Carotid Doppler in October 2018 showed 1 to 39% bilateral disease.  He was seen in June 2021 for vague chest pain that was nitrate responsive.  Isosorbide was further titrated.  In September 2021, he developed a syncopal spell while sitting in the chair.  His wife gave him a dose of sublingual nitroglycerin and he subsequently passed out.  On EMS arrival, blood pressure was 89/65.  His CBG 112.  Bone marrow biopsy performed on 12/18/2020 diagnosed the patient with plasma cell myeloma.  Skeletal survey negative for lytic lesion.  Patient has been followed by Dr. Delton Coombes. He went to the emergency room on 01/04/2021 after developed syncope while taking a hot shower.  When EMS arrived, they stood the patient up however he became bradycardic, pale, diaphoretic and had another syncopal episode.  EKG showed first-degree AV block similar to priors.  Serial troponin negative x2.  BNP normal.   D-dimer positive, CTA was negative for PE or acute pulmonary issue.  Electrolyte normal.  CT of the head and neck negative for acute injury.  Right upper quadrant ultrasound negative.  He was treated with IV fluid.  Orthostatic vital sign was reassuring.  Patient went to the ED in September 2022 with leg swelling and diagnosed with cellulitis and treated with doxycycline.  I previously saw the patient in September 2022 for preoperative clearance prior to hernia repair, however he was having second thoughts regarding her surgery and decided to hold it off.  He was seen by Dr. Raynelle Chary in November 2022, given presyncopal episode, his Imdur was decreased to 60 mg a.m. and 30 mg p.m.  His Lasix was reduced to 20 mg daily.  Echocardiogram obtained on 05/10/2021 showed EF 60 to 65%, no regional wall motion abnormality, mild MR, mild AI.  Since last visit, he has discontinued Revlimid due to intolerance and started on Velcade by hematology/oncology service.  He is also taking weekly dosing of dexamethasone for myeloma as well.  He remains anemic with last hemoglobin 7.8.  He also has significant thrombocytopenia.  He required blood transfusion on 06/07/2021.  Plavix has been discontinued on 06/06/2021 due to rectal bleeding and thrombocytopenia.  He is on acyclovir twice a day for shingles prophylaxis.  Patient presents today for follow-up accompanied by his son.  He is tolerating the Velcade injection without significant symptoms.  He mentioned he had episode of chest discomfort 2 months ago, however has not had any further chest discomfort in the past 2 months.  His current issue has been with anemia and severe thrombocytopenia.  He discontinued Plavix for a week due to rectal bleeding in the beginning of February and has restarted since then.  Given the recent severe anemia and thrombocytopenia, risk outweigh potential benefit of Plavix, I recommend to stop the Plavix entirely.  He still has ankle edema, however no  significant leg edema.  His lung is clear.  Recent echocardiogram was reassuring.  He can follow-up with [name] in 6 months.   Past Medical History:  Diagnosis Date   Allergic rhinitis    Anal fissure    Anginal pain (HCC)    Asthma    Cancer (HCC)    skin   Chest pain    CHF (congestive heart failure) ([place])    Coronary heart disease    s/p stenting. cath in 01/2012 noncritical occlusion   Dysrhythmia    1st degree heart block   GERD (gastroesophageal reflux disease)    Glaucoma    Hiatal hernia    Hyperlipidemia    Hypertension    Hypothyroidism    Idiopathic thrombocytopenic purpura ([place]) 2002   Macular degeneration    Nephrolithiasis    PUD (peptic ulcer disease)    remote   Sarcoidosis    pulmonary   Schatzki's ring     Past Surgical History:  Procedure Laterality Date   cardiac stents     COLONOSCOPY  10/30/2006   Normal rectum, sigmoid diverticula.Remainder of colonic mucosa appeared normal.   CORONARY ANGIOPLASTY WITH STENT PLACEMENT     about 10 years ago per pt (around 2007)   [place], URETEROSCOPY AND STENT PLACEMENT Left 06/16/2017   Procedure: CYSTOSCOPY WITH RETROGRADE PYELOGRAM, URETEROSCOPY,STONE EXTRACTION  AND STENT PLACEMENT;  Surgeon: Franchot Gallo, MD;  Location: [facility];  Service: Urology;  Laterality: Left;   ESOPHAGOGASTRODUODENOSCOPY  06/19/2004   Two esophageal rings and esophageal web as described above.  All of these were disrupted by passing 56-French Venia Minks dilator/ Candida esophagitis,which appears to be incidental given history of   antibiotic use, but nevertheless will be treated.   ESOPHAGOGASTRODUODENOSCOPY  10/30/2006   Distal tandem esophageal ring status post dilation disruption as  described above.  Otherwise normal esophagus/  Small hiatal hernia otherwise normal stomach, D1 and D2   ESOPHAGOGASTRODUODENOSCOPY N/A 03/22/2015   Dr.Rourk- noncritical schatzki's ring and hiatal hernia-o/w normal EGD.     ESOPHAGOGASTRODUODENOSCOPY (EGD) WITH ESOPHAGEAL DILATION  03/04/2012   RMR- schatzki's ring, hiatal hernia, polypoid gastric mucosa, bx= minimally active gastritis.   HOLMIUM LASER APPLICATION Left 1/32/4401   Procedure: HOLMIUM LASER APPLICATION;  Surgeon: Franchot Gallo, MD;  Location: [facility];  Service: Urology;  Laterality: Left;   IR GENERIC HISTORICAL  03/06/2016   IR RADIOLOGIST EVAL & MGMT 03/06/2016 Aletta Edouard, MD [facility] INTERV RAD   IR GENERIC HISTORICAL  06/18/2016   IR RADIOLOGIST EVAL & MGMT 06/18/2016 Aletta Edouard, MD [facility] INTERV RAD   IR RADIOLOGIST EVAL & MGMT  10/01/2016   IR RADIOLOGIST EVAL & MGMT  10/15/2017   IR RADIOLOGIST EVAL & MGMT  12/24/2018   IR RADIOLOGIST EVAL & MGMT  01/04/2020   IR RADIOLOGIST EVAL & MGMT  06/20/2021   LEFT HEART CATH N/A 02/02/2012   Procedure: LEFT HEART CATH;  Surgeon: Lorretta Harp, MD;  Location: [facility] CATH LAB;  Service:  Cardiovascular;  Laterality: N/A;   MEDIASTINOSCOPY     for dx sarcoid   RADIOLOGY WITH ANESTHESIA Left 05/17/2016   Procedure: left renal ablation;  Surgeon: Aletta Edouard, MD;  Location: [facility];  Service: Radiology;  Laterality: Left;    Current Medications: Current Meds  Medication Sig   acyclovir (ZOVIRAX) 400 MG tablet Take 1 tablet (400 mg total) by mouth 2 (two) times daily.   aspirin 81 MG tablet Take 81 mg by mouth every Monday, Wednesday, and Friday.   atorvastatin (LIPITOR) 40 MG tablet TAKE 1 TABLET BY MOUTH IN  THE EVENING (Patient taking differently: Take 40 mg by mouth every evening.)   Bortezomib (VELCADE IJ) Inject as directed. Three weeks on and one weeks off.   brimonidine (ALPHAGAN) 0.2 % ophthalmic solution Place 1 drop into both eyes 2 (two) times daily.   dexamethasone (DECADRON) 2 MG tablet TAKE 5 TABLETS(10 MG) BY MOUTH 1 TIME A WEEK   dorzolamide-timolol (COSOPT) 22.3-6.8 MG/ML ophthalmic solution Place 1 drop into both eyes 2 (two) times daily.   furosemide (LASIX) 20 MG tablet If  you notice any swelling, you may take 40 mg (two tablets) for the day. (Patient taking differently: 40 mg daily as needed for fluid.)   isosorbide mononitrate (IMDUR) 60 MG 24 hr tablet TAKE 1 AND 1/2 TABLETS BY  MOUTH IN THE MORNING AND  1/2 TABLET AT NIGHT   levothyroxine (SYNTHROID) 100 MCG tablet Take 100 mcg by mouth daily before breakfast.   losartan (COZAAR) 100 MG tablet TAKE 1 TABLET BY MOUTH  DAILY   Multiple Vitamins-Minerals (PRESERVISION/LUTEIN) CAPS Take 1 capsule by mouth 2 (two) times daily.   pantoprazole (PROTONIX) 40 MG tablet TAKE 1 TABLET BY MOUTH  DAILY BEFORE BREAKFAST (Patient taking differently: Take 40 mg by mouth daily.)   potassium chloride SA (KLOR-CON M) 20 MEQ tablet TAKE 1 TABLET BY MOUTH  DAILY   ROCKLATAN 0.02-0.005 % SOLN Apply 1 drop to eye at bedtime.   vitamin C (ASCORBIC ACID) 500 MG tablet Take 500 mg by mouth daily.   vitamin E 200 UNIT capsule Take 200 Units by mouth daily at 12 noon.    [DISCONTINUED] clopidogrel (PLAVIX) 75 MG tablet TAKE 1 TABLET BY MOUTH  DAILY   [DISCONTINUED] metoprolol tartrate (LOPRESSOR) 25 MG tablet TAKE 1 TABLET BY MOUTH IN  THE MORNING AND 1/2 TABLET  IN THE EVENING (Patient taking differently: Take 12.5-25 mg by mouth See admin instructions. 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    Employer: RETIRED  Tobacco Use   Smoking status: Former    Packs/day: 0.10    Years: 2.00    Pack years: 0.20    Types: Cigarettes, Cigars    Quit date: 05/06/1970    Years since  quitting: 51.2   Smokeless tobacco: Never  Vaping Use   Vaping Use: Never used  Substance and Sexual Activity   Alcohol use: No    Alcohol/week: 0.0 standard drinks   Drug use: No   Sexual activity:  Never  Other Topics Concern   Not on file  Social History Narrative   Not on file   Social Determinants of Health   Financial Resource Strain: Not on file  Food Insecurity: Not on file  Transportation Needs: Not on file  Physical Activity: Not on file  Stress: Not on file  Social Connections: Not on file     Family History: The patient's family history includes Alzheimer's disease in his mother; Cancer in an other family member; Heart attack in his brother; Heart disease in his father; Stroke in his mother. There is no history of Colon cancer.  ROS:   Please see the history of present illness.     All other systems reviewed and are negative.  EKGs/Labs/Other Studies Reviewed:    The following studies were reviewed today:  Echo 05/10/2021 1. Left ventricular ejection fraction, by estimation, is 60 to 65%. The left ventricle has normal function. The left ventricle has no regional wall motion abnormalities. Left ventricular diastolic parameters were normal. 2. Right ventricular systolic function is normal. The right ventricular size is normal. There is mildly elevated pulmonary artery systolic pressure. 3. The mitral valve is normal in structure. Mild mitral valve regurgitation. No evidence of mitral stenosis. 4. The aortic valve is tricuspid. Aortic valve regurgitation is mild. No aortic stenosis is present. 5. The inferior vena cava is normal in size with greater than 50% respiratory variability, suggesting right atrial pressure of 3 mmHg.  EKG:  EKG is not ordered today.    Recent Labs: 01/04/2021: B Natriuretic Peptide 220.6 07/02/2021: ALT 19; BUN 22; Creatinine, Ser 0.80; Hemoglobin 7.3; Magnesium 2.0; Platelets 91; Potassium 3.9; Sodium 130  Recent Lipid Panel    Component  Value Date/Time   CHOL 123 10/26/2019 1013   TRIG 107 10/26/2019 1013   HDL 41 10/26/2019 1013   CHOLHDL 3.0 10/26/2019 1013   CHOLHDL 2.6 01/02/2017 0612   VLDL 10 01/02/2017 0612   LDLCALC 62 10/26/2019 1013     Risk Assessment/Calculations:           Physical Exam:    VS:  BP (!) 160/70    Pulse 62    Ht 5' 8" (1.727 m)    Wt 126 lb 12.8 oz (57.5 kg)    SpO2 99%    BMI 19.28 kg/m     Wt Readings from Last 3 Encounters:  07/11/21 126 lb 12.8 oz (57.5 kg)  07/02/21 126 lb (57.2 kg)  06/25/21 128 lb 4 oz (58.2 kg)     GEN:  Well nourished, well developed in no acute distress HEENT: Normal NECK: No JVD; No carotid bruits LYMPHATICS: No lymphadenopathy CARDIAC: RRR, no murmurs, rubs, gallops RESPIRATORY:  Clear to auscultation without rales, wheezing or rhonchi  ABDOMEN: Soft, non-tender, non-distended MUSCULOSKELETAL:  No edema; No deformity  SKIN: Warm and dry NEUROLOGIC:  Alert and oriented x 3 PSYCHIATRIC:  Normal affect   ASSESSMENT:    1. Coronary artery disease involving native coronary artery of native heart without angina pectoris   2. Essential hypertension   3. Hyperlipidemia LDL goal <70   4. Hypothyroidism, unspecified type   5. Multiple myeloma, remission status unspecified (HCC)    PLAN:    In order of problems listed above:  CAD: Patient's last PCI was in 2009.  Last cardiac catheterization in September 2013 showed patent stents.  He had a episode of chest pain roughly 2 months ago however has not had any further chest discomfort since.  Given the recent anemia and thrombocytopenia related to multiple myeloma, he would not be a candidate for any invasive study.  Given the lack of chest pain for the past 2 months, I recommended continue observation at this time  Hypertension: Blood pressure is elevated today.  Continue Imdur, losartan and metoprolol.  Hyperlipidemia: On Lipitor  Hypothyroidism: On levothyroxine  Multiple myeloma: Followed by  hematology and oncology service.  Has both associated anemia and thrombocytopenia.      Medication Adjustments/Labs and Tests Ordered: Current medicines are reviewed at length with the patient today.  Concerns regarding medicines are outlined above.  No orders of the defined types were placed in this encounter.  No orders of the defined types were placed in this encounter.   Patient Instructions  Medication Instructions:  STOP Plavix  *If you need a refill on your cardiac medications before your next appointment, please call your pharmacy*   Lab Work: Have primary care draw labs at [facility]- we recommend to have a LIPID Panel done, have them send results to [place] (fax number [phone])  If you have labs (blood work) drawn today and your tests are completely normal, you will receive your results only by: [place] (if you have MyChart) OR A paper copy in the mail If you have any lab test that is abnormal or we need to change your treatment, we will call you to review the results.   Follow-Up: At [facility], you and your health needs are our priority.  As part of our continuing mission to provide you with exceptional heart care, we have created designated Provider Care Teams.  These Care Teams include your primary Cardiologist (physician) and Advanced Practice Providers (APPs -  Physician Assistants and Nurse Practitioners) who all work together to provide you with the care you need, when you need it.  We recommend signing up for the patient portal called "MyChart".  Sign up information is provided on this After Visit Summary.  MyChart is used to connect with patients for Virtual Visits (Telemedicine).  Patients are able to view lab/test results, encounter notes, upcoming appointments, etc.  Non-urgent messages can be sent to your provider as well.   To learn more about what you can do with MyChart, go to [email or web address].    Your next appointment:   6 month(s)  The  format for your next appointment:   In Person  Provider:   Shelva Majestic, MD       Signed, [name], [place]  07/13/2021 3:58 PM    [place]

## 2021-07-11 NOTE — Patient Instructions (Signed)
Medication Instructions:  ?STOP Plavix  ?*If you need a refill on your cardiac medications before your next appointment, please call your pharmacy* ? ? ?Lab Work: ?Have primary care draw labs at [facility]- we recommend to have a LIPID Panel done, have them send results to [place] (fax number [phone]) ? ?If you have labs (blood work) drawn today and your tests are completely normal, you will receive your results only by: ?MyChart Message (if you have MyChart) OR ?A paper copy in the mail ?If you have any lab test that is abnormal or we need to change your treatment, we will call you to review the results. ? ? ?Follow-Up: ?At [facility], you and your health needs are our priority.  As part of our continuing mission to provide you with exceptional heart care, we have created designated Provider Care Teams.  These Care Teams include your primary Cardiologist (physician) and Advanced Practice Providers (APPs -  Physician Assistants and Nurse Practitioners) who all work together to provide you with the care you need, when you need it. ? ?We recommend signing up for the patient portal called "MyChart".  Sign up information is provided on this After Visit Summary.  MyChart is used to connect with patients for Virtual Visits (Telemedicine).  Patients are able to view lab/test results, encounter notes, upcoming appointments, etc.  Non-urgent messages can be sent to your provider as well.   ?To learn more about what you can do with MyChart, go to [email or web address].   ? ?Your next appointment:   ?6 month(s) ? ?The format for your next appointment:   ?In Person ? ?Provider:   ?Shelva Majestic, MD   ? ? ?

## 2021-07-12 DIAGNOSIS — N400 Enlarged prostate without lower urinary tract symptoms: Secondary | ICD-10-CM | POA: Diagnosis not present

## 2021-07-12 DIAGNOSIS — R7989 Other specified abnormal findings of blood chemistry: Secondary | ICD-10-CM | POA: Diagnosis not present

## 2021-07-12 DIAGNOSIS — M1991 Primary osteoarthritis, unspecified site: Secondary | ICD-10-CM | POA: Diagnosis not present

## 2021-07-12 DIAGNOSIS — Z681 Body mass index (BMI) 19 or less, adult: Secondary | ICD-10-CM | POA: Diagnosis not present

## 2021-07-12 DIAGNOSIS — I10 Essential (primary) hypertension: Secondary | ICD-10-CM | POA: Diagnosis not present

## 2021-07-12 DIAGNOSIS — Z1331 Encounter for screening for depression: Secondary | ICD-10-CM | POA: Diagnosis not present

## 2021-07-12 DIAGNOSIS — E039 Hypothyroidism, unspecified: Secondary | ICD-10-CM | POA: Diagnosis not present

## 2021-07-12 DIAGNOSIS — E559 Vitamin D deficiency, unspecified: Secondary | ICD-10-CM | POA: Diagnosis not present

## 2021-07-12 DIAGNOSIS — Z0001 Encounter for general adult medical examination with abnormal findings: Secondary | ICD-10-CM | POA: Diagnosis not present

## 2021-07-13 ENCOUNTER — Other Ambulatory Visit: Payer: Self-pay | Admitting: Cardiovascular Disease

## 2021-07-13 ENCOUNTER — Encounter: Payer: Self-pay | Admitting: Physician Assistant

## 2021-07-17 ENCOUNTER — Ambulatory Visit (HOSPITAL_COMMUNITY): Payer: Medicare Other

## 2021-07-17 ENCOUNTER — Inpatient Hospital Stay (HOSPITAL_COMMUNITY): Payer: Medicare Other

## 2021-07-17 ENCOUNTER — Ambulatory Visit (HOSPITAL_COMMUNITY): Payer: Medicare Other | Admitting: Hematology

## 2021-07-19 ENCOUNTER — Other Ambulatory Visit: Payer: Self-pay

## 2021-07-19 ENCOUNTER — Inpatient Hospital Stay (HOSPITAL_BASED_OUTPATIENT_CLINIC_OR_DEPARTMENT_OTHER): Payer: Medicare Other | Admitting: Hematology

## 2021-07-19 ENCOUNTER — Inpatient Hospital Stay (HOSPITAL_COMMUNITY): Payer: Medicare Other

## 2021-07-19 ENCOUNTER — Inpatient Hospital Stay (HOSPITAL_COMMUNITY): Payer: Medicare Other | Attending: Hematology

## 2021-07-19 VITALS — BP 154/61 | HR 73 | Temp 97.8°F | Resp 18 | Ht 68.0 in | Wt 127.4 lb

## 2021-07-19 DIAGNOSIS — Z5112 Encounter for antineoplastic immunotherapy: Secondary | ICD-10-CM | POA: Insufficient documentation

## 2021-07-19 DIAGNOSIS — D649 Anemia, unspecified: Secondary | ICD-10-CM

## 2021-07-19 DIAGNOSIS — Z79899 Other long term (current) drug therapy: Secondary | ICD-10-CM | POA: Diagnosis not present

## 2021-07-19 DIAGNOSIS — D630 Anemia in neoplastic disease: Secondary | ICD-10-CM | POA: Diagnosis not present

## 2021-07-19 DIAGNOSIS — C9000 Multiple myeloma not having achieved remission: Secondary | ICD-10-CM | POA: Diagnosis not present

## 2021-07-19 LAB — COMPREHENSIVE METABOLIC PANEL
ALT: 19 U/L (ref 0–44)
AST: 17 U/L (ref 15–41)
Albumin: 2.8 g/dL — ABNORMAL LOW (ref 3.5–5.0)
Alkaline Phosphatase: 65 U/L (ref 38–126)
Anion gap: 4 — ABNORMAL LOW (ref 5–15)
BUN: 21 mg/dL (ref 8–23)
CO2: 23 mmol/L (ref 22–32)
Calcium: 8.8 mg/dL — ABNORMAL LOW (ref 8.9–10.3)
Chloride: 109 mmol/L (ref 98–111)
Creatinine, Ser: 0.87 mg/dL (ref 0.61–1.24)
GFR, Estimated: >60 mL/min (ref >60)
Glucose, Bld: 94 mg/dL (ref 70–99)
Potassium: 4.3 mmol/L (ref 3.5–5.1)
Sodium: 136 mmol/L (ref 135–145)
Total Bilirubin: 0.5 mg/dL (ref 0.3–1.2)
Total Protein: 8.9 g/dL — ABNORMAL HIGH (ref 6.5–8.1)

## 2021-07-19 LAB — CBC WITH DIFFERENTIAL/PLATELET
Abs Immature Granulocytes: 0.01 10*3/uL (ref 0.00–0.07)
Basophils Absolute: 0 10*3/uL (ref 0.0–0.1)
Basophils Relative: 0 %
Eosinophils Absolute: 0.1 10*3/uL (ref 0.0–0.5)
Eosinophils Relative: 3 %
HCT: 19.1 % — ABNORMAL LOW (ref 39.0–52.0)
Hemoglobin: 6.1 g/dL — CL (ref 13.0–17.0)
Immature Granulocytes: 0 %
Lymphocytes Relative: 33 %
Lymphs Abs: 1.4 10*3/uL (ref 0.7–4.0)
MCH: 38.4 pg — ABNORMAL HIGH (ref 26.0–34.0)
MCHC: 31.9 g/dL (ref 30.0–36.0)
MCV: 120.1 fL — ABNORMAL HIGH (ref 80.0–100.0)
Monocytes Absolute: 1.1 10*3/uL — ABNORMAL HIGH (ref 0.1–1.0)
Monocytes Relative: 26 %
Neutro Abs: 1.6 10*3/uL — ABNORMAL LOW (ref 1.7–7.7)
Neutrophils Relative %: 38 %
Platelets: 86 10*3/uL — ABNORMAL LOW (ref 150–400)
RBC: 1.59 MIL/uL — ABNORMAL LOW (ref 4.22–5.81)
RDW: 20.8 % — ABNORMAL HIGH (ref 11.5–15.5)
WBC: 4.3 10*3/uL (ref 4.0–10.5)
nRBC: 0 % (ref 0.0–0.2)

## 2021-07-19 LAB — SAMPLE TO BLOOD BANK

## 2021-07-19 LAB — PREPARE RBC (CROSSMATCH)

## 2021-07-19 LAB — MAGNESIUM: Magnesium: 2.3 mg/dL (ref 1.7–2.4)

## 2021-07-19 MED ORDER — BORTEZOMIB CHEMO SQ INJECTION 3.5 MG (2.5MG/ML)
1.0000 mg/m2 | Freq: Once | INTRAMUSCULAR | Status: AC
  Administered 2021-07-19: 1.75 mg via SUBCUTANEOUS
  Filled 2021-07-19: qty 0.7

## 2021-07-19 NOTE — Progress Notes (Signed)
Okay to treat per Dr. Delton Coombes. Patient tolerated Velcade injection with no complaints voiced. Lab work reviewed. See MAR for details. Injection site clean and dry with no bruising or swelling noted. Patient stable during and after injection. Band aid applied. VSS. Patient left in satisfactory condition with no s/s of distress noted.  ?

## 2021-07-19 NOTE — Progress Notes (Unsigned)
CRITICAL VALUE ALERT ?Critical value received:  HGB 6.1  ?Date of notification:  07/19/2021 ?Time of notification: 13:55 pm  ?Critical value read back:  Yes.   ?Nurse who received alert:  B. Dalicia Kisner RN  ?MD notified time and response:  Dr. Delton Coombes / C. Edwards LPN  ?

## 2021-07-19 NOTE — Patient Instructions (Signed)
[place] at [facility] ?Discharge Instructions ? ?You were seen and examined today by Dr. Delton Coombes. He reviewed your most recent labs and your hemoglobin is low. Dr. Delton Coombes is going to set you up for blood transfusion tomorrow 07/19/2021 @ 9:15 am. Please keep follow up appointment as scheduled in 3 weeks. ? ? ?Thank you for choosing [place] at [facility] to provide your oncology and hematology care.  To afford each patient quality time with our provider, please arrive at least 15 minutes before your scheduled appointment time.  ? ?If you have a lab appointment with the [facility] please come in thru the Main Entrance and check in at the main information desk. ? ?You need to re-schedule your appointment should you arrive 10 or more minutes late.  We strive to give you quality time with our providers, and arriving late affects you and other patients whose appointments are after yours.  Also, if you no show three or more times for appointments you may be dismissed from the clinic at the providers discretion.     ?Again, thank you for choosing [facility].  Our hope is that these requests will decrease the amount of time that you wait before being seen by our physicians.       ?_____________________________________________________________ ? ?Should you have questions after your visit to [facility], please contact our office at [phone] and follow the prompts.  Our office hours are 8:00 a.m. and 4:30 p.m. Monday - Friday.  Please note that voicemails left after 4:00 p.m. may not be returned until the following business day.  We are closed weekends and major holidays.  You do have access to a nurse 24-7, just call the main number to the clinic [phone] and do not press any options, hold on the line and a nurse will answer the phone.   ? ?For prescription refill requests, have your pharmacy contact our office and allow 72  hours.   ? ?Due to Covid, you will need to wear a mask upon entering the hospital. If you do not have a mask, a mask will be given to you at the Main Entrance upon arrival. For doctor visits, patients may have 1 support person age 38 or older with them. For treatment visits, patients can not have anyone with them due to social distancing guidelines and our immunocompromised population.  ? ?  ?

## 2021-07-19 NOTE — Patient Instructions (Signed)
[facility]  Discharge Instructions: ?Thank you for choosing [facility] to provide your oncology and hematology care.  ?If you have a lab appointment with the [facility], please come in thru the Main Entrance and check in at the main information desk. ? ?Wear comfortable clothing and clothing appropriate for easy access to any Portacath or PICC line.  ? ?We strive to give you quality time with your provider. You may need to reschedule your appointment if you arrive late (15 or more minutes).  Arriving late affects you and other patients whose appointments are after yours.  Also, if you miss three or more appointments without notifying the office, you may be dismissed from the clinic at the provider?s discretion.    ?  ?For prescription refill requests, have your pharmacy contact our office and allow 72 hours for refills to be completed.   ? ?Today you received the following Velcade, return as scheduled. ?  ?To help prevent nausea and vomiting after your treatment, we encourage you to take your nausea medication as directed. ? ?BELOW ARE SYMPTOMS THAT SHOULD BE REPORTED IMMEDIATELY: ?*FEVER GREATER THAN 100.4 F (38 ?C) OR HIGHER ?*CHILLS OR SWEATING ?*NAUSEA AND VOMITING THAT IS NOT CONTROLLED WITH YOUR NAUSEA MEDICATION ?*UNUSUAL SHORTNESS OF BREATH ?*UNUSUAL BRUISING OR BLEEDING ?*URINARY PROBLEMS (pain or burning when urinating, or frequent urination) ?*BOWEL PROBLEMS (unusual diarrhea, constipation, pain near the anus) ?TENDERNESS IN MOUTH AND THROAT WITH OR WITHOUT PRESENCE OF ULCERS (sore throat, sores in mouth, or a toothache) ?UNUSUAL RASH, SWELLING OR PAIN  ?UNUSUAL VAGINAL DISCHARGE OR ITCHING  ? ?Items with * indicate a potential emergency and should be followed up as soon as possible or go to the Emergency Department if any problems should occur. ? ?Please show the CHEMOTHERAPY ALERT CARD or IMMUNOTHERAPY ALERT CARD at check-in to the Emergency Department and triage  nurse. ? ?Should you have questions after your visit or need to cancel or reschedule your appointment, please contact [facility] [phone]  and follow the prompts.  Office hours are 8:00 a.m. to 4:30 p.m. Monday - Friday. Please note that voicemails left after 4:00 p.m. may not be returned until the following business day.  We are closed weekends and major holidays. You have access to a nurse at all times for urgent questions. Please call the main number to the clinic [phone] and follow the prompts. ? ?For any non-urgent questions, you may also contact your provider using MyChart. We now offer e-Visits for anyone 67 and older to request care online for non-urgent symptoms. For details visit [email or web address]. ?  ?Also download the MyChart app! Go to the app store, search "MyChart", open the app, select [facility], and log in with your MyChart username and password. ? ?Due to Covid, a mask is required upon entering the hospital/clinic. If you do not have a mask, one will be given to you upon arrival. For doctor visits, patients may have 1 support person aged 56 or older with them. For treatment visits, patients cannot have anyone with them due to current Covid guidelines and our immunocompromised population.  ?

## 2021-07-19 NOTE — Progress Notes (Signed)
OK to treat today with labs ? ?T.O. Dr Rhys Martini PharmD ?

## 2021-07-19 NOTE — Progress Notes (Signed)
Patient has been assessed, vital signs and labs have been reviewed by Dr. Katragadda. ANC, Creatinine, LFTs, and Platelets are within treatment parameters per Dr. Katragadda. The patient is good to proceed with treatment at this time. Primary RN and pharmacy aware.  

## 2021-07-19 NOTE — Progress Notes (Signed)
? ?[facility] ?[address] ?[place], [place] [address] ? ? ?CLINIC:  ?Medical Oncology/Hematology ? ?PCP:  ?Redmond School, MD ?[address] / [place] [address] ?[phone] ? ? ?REASON FOR VISIT:  ?Follow-up for multiple myeloma and anemia ? ?PRIOR THERAPY: none ? ?CURRENT THERAPY: surveillance ? ?BRIEF ONCOLOGIC HISTORY:  ?Oncology History  ?Multiple myeloma ([place])  ?01/15/2021 Initial Diagnosis  ? Multiple myeloma ([place]) ?  ?06/25/2021 -  Chemotherapy  ? Patient is on Treatment Plan : MYELOMA NON-TRANSPLANT CANDIDATES VRd weekly q21d  ?   ? ? ?CANCER STAGING: ? Cancer Staging  ?No matching staging information was found for the patient. ? ?INTERVAL HISTORY:  ?[name], a 88 y.o. male, returns for routine follow-up of his multiple myeloma and anemia. Dale [name] was last seen on 06/25/2021.  ? ?Today he reports feeling good. He denies fatigue, light-headedness, and CP. He reports 1 episode of nausea after eating. He denies n/v/d following his last injection. He reports swelling in his right ankle. He takes his 5 decadron tablets on Sunday.  ? ?REVIEW OF SYSTEMS:  ?Review of Systems  ?Constitutional:  Negative for appetite change and fatigue.  ?Cardiovascular:  Negative for chest pain.  ?Gastrointestinal:  Positive for nausea.  ?Neurological:  Negative for light-headedness.  ?All other systems reviewed and are negative. ? ?PAST MEDICAL/SURGICAL HISTORY:  ?Past Medical History:  ?Diagnosis Date  ? Allergic rhinitis   ? Anal fissure   ? Anginal pain ([place])   ? Asthma   ? Cancer [facility])   ? skin  ? Chest pain   ? CHF (congestive heart failure) ([place])   ? Coronary heart disease   ? s/p stenting. cath in 01/2012 noncritical occlusion  ? Dysrhythmia   ? 1st degree heart block  ? GERD (gastroesophageal reflux disease)   ? Glaucoma   ? Hiatal hernia   ? Hyperlipidemia   ? Hypertension   ? Hypothyroidism   ? Idiopathic thrombocytopenic purpura ([place]) 2002  ? Macular degeneration   ? Nephrolithiasis   ?  PUD (peptic ulcer disease)   ? remote  ? Sarcoidosis   ? pulmonary  ? Schatzki's ring   ? ?Past Surgical History:  ?Procedure Laterality Date  ? cardiac stents    ? COLONOSCOPY  10/30/2006  ? Normal rectum, sigmoid diverticula.Remainder of colonic mucosa appeared normal.  ? CORONARY ANGIOPLASTY WITH STENT PLACEMENT    ? about 10 years ago per pt (around 2007)  ? CYSTOSCOPY WITH RETROGRADE PYELOGRAM, URETEROSCOPY AND STENT PLACEMENT Left 06/16/2017  ? Procedure: CYSTOSCOPY WITH RETROGRADE PYELOGRAM, URETEROSCOPY,STONE EXTRACTION  AND STENT PLACEMENT;  Surgeon: Franchot Gallo, MD;  Location: [facility];  Service: Urology;  Laterality: Left;  ? ESOPHAGOGASTRODUODENOSCOPY  06/19/2004  ? Two esophageal rings and esophageal web as described above.  All of these were disrupted by passing 56-French Venia Minks dilator/ Candida esophagitis,which appears to be incidental given history of   antibiotic use, but nevertheless will be treated.  ? ESOPHAGOGASTRODUODENOSCOPY  10/30/2006  ? Distal tandem esophageal ring status post dilation disruption as  described above.  Otherwise normal esophagus/  Small hiatal hernia otherwise normal stomach, D1 and D2  ? ESOPHAGOGASTRODUODENOSCOPY N/A 03/22/2015  ? Dr.Rourk- noncritical schatzki's ring and hiatal hernia-o/w normal EGD.   ? ESOPHAGOGASTRODUODENOSCOPY (EGD) WITH ESOPHAGEAL DILATION  03/04/2012  ? RMR- schatzki's ring, hiatal hernia, polypoid gastric mucosa, bx= minimally active gastritis.  ? HOLMIUM LASER APPLICATION Left 09/08/3974  ? Procedure: HOLMIUM LASER APPLICATION;  Surgeon: Franchot Gallo, MD;  Location: [facility];  Service: Urology;  Laterality: Left;  ? IR GENERIC HISTORICAL  03/06/2016  ? IR RADIOLOGIST EVAL & MGMT 03/06/2016 Aletta Edouard, MD [facility] INTERV RAD  ? IR GENERIC HISTORICAL  06/18/2016  ? IR RADIOLOGIST EVAL & MGMT 06/18/2016 Aletta Edouard, MD [facility] INTERV RAD  ? IR RADIOLOGIST EVAL & MGMT  10/01/2016  ? IR RADIOLOGIST EVAL & MGMT  10/15/2017  ? IR RADIOLOGIST EVAL  & MGMT  12/24/2018  ? IR RADIOLOGIST EVAL & MGMT  01/04/2020  ? IR RADIOLOGIST EVAL & MGMT  06/20/2021  ? LEFT HEART CATH N/A 02/02/2012  ? Procedure: LEFT HEART CATH;  Surgeon: Lorretta Harp, MD;  Location: [facility] CATH LAB;  Service: Cardiovascular;  Laterality: N/A;  ? MEDIASTINOSCOPY    ? for dx sarcoid  ? RADIOLOGY WITH ANESTHESIA Left 05/17/2016  ? Procedure: left renal ablation;  Surgeon: Aletta Edouard, MD;  Location: [facility];  Service: Radiology;  Laterality: Left;  ? ? ?SOCIAL HISTORY:  ?Social History  ? ?Socioeconomic History  ? Marital status: Married  ?  Spouse name: Not on file  ? Number of children: 1  ? Years of education: Not on file  ? Highest education level: Not on file  ?Occupational History  ? Occupation: Retired  ?  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  Employer: RETIRED  ?Tobacco Use  ? Smoking status: Former  ?  Packs/day: 0.10  ?  Years: 2.00  ?  Pack years: 0.20  ?  Types: Cigarettes, Cigars  ?  Quit date: 05/06/1970  ?  Years since quitting: 51.2  ? Smokeless tobacco: Never  ?Vaping Use  ? Vaping Use: Never used  ?Substance and Sexual Activity  ? Alcohol use: No  ?  Alcohol/week: 0.0 standard drinks  ? Drug use: No  ? Sexual activity: Never  ?Other Topics Concern  ? Not on file  ?Social History Narrative  ? Not on file  ? ?Social Determinants of Health  ? ?Financial Resource Strain: Not on file  ?Food Insecurity: Not on file  ?Transportation Needs: Not on file  ?Physical Activity: Not on file  ?Stress: Not on file  ?Social Connections: Not on file  ?Intimate Partner Violence: Not on file  ? ? ?FAMILY HISTORY:  ?Family History  ?Problem Relation Age of Onset  ? Heart disease Father   ?     deceased age 10  ? Stroke Mother   ? Alzheimer's disease Mother   ? Heart attack Brother   ?     deceased age 62  ? Cancer Other   ?     niece  ? Colon cancer Neg Hx   ? ? ?CURRENT MEDICATIONS:  ?Current Outpatient Medications  ?Medication Sig Dispense Refill  ? acyclovir (ZOVIRAX) 400 MG tablet Take 1 tablet  (400 mg total) by mouth 2 (two) times daily. 60 tablet 6  ? aspirin 81 MG tablet Take 81 mg by mouth every Monday, Wednesday, and Friday.    ? atorvastatin (LIPITOR) 40 MG tablet TAKE 1 TABLET BY MOUTH IN  THE EVENING (Patient taking differently: Take 40 mg by mouth every evening.) 90 tablet 3  ? atropine 1 % ophthalmic solution     ? Bortezomib (VELCADE IJ) Inject as directed. Three weeks on and one weeks off.    ? brimonidine (ALPHAGAN) 0.2 % ophthalmic solution Place 1 drop into both eyes 2 (two) times daily.    ? dexamethasone (DECADRON) 2 MG tablet TAKE 5 TABLETS(10 MG)  BY MOUTH 1 TIME A WEEK 20 tablet 3  ? dorzolamide-timolol (COSOPT) 22.3-6.8 MG/ML ophthalmic solution Place 1 drop into both eyes 2 (two) times daily.    ? fluocinonide cream (LIDEX) 0.96 % Apply 1 application. topically 2 (two) times daily.    ? furosemide (LASIX) 20 MG tablet If you notice any swelling, you may take 40 mg (two tablets) for the day. (Patient taking differently: 40 mg daily as needed for fluid.) 90 tablet 3  ? isosorbide mononitrate (IMDUR) 60 MG 24 hr tablet TAKE 1 AND 1/2 TABLETS BY  MOUTH IN THE MORNING AND  1/2 TABLET AT NIGHT 180 tablet 3  ? lenalidomide (REVLIMID) 5 MG capsule Take 1 capsule (5 mg total) by mouth daily. 14 days on, 14 days off 14 capsule 0  ? levothyroxine (SYNTHROID) 100 MCG tablet Take 100 mcg by mouth daily before breakfast.    ? levothyroxine (SYNTHROID) 125 MCG tablet Take 125 mcg by mouth daily.    ? losartan (COZAAR) 100 MG tablet TAKE 1 TABLET BY MOUTH  DAILY 90 tablet 2  ? metoCLOPramide (REGLAN) 10 MG tablet Take 1 tablet (10 mg total) by mouth every 6 (six) hours as needed for nausea or vomiting. 15 tablet 0  ? metoprolol tartrate (LOPRESSOR) 25 MG tablet TAKE 1 TABLET BY MOUTH IN  THE MORNING AND ONE-HALF  TABLET BY MOUTH IN THE  EVENING 135 tablet 3  ? mometasone (ELOCON) 0.1 % ointment Apply 1 application. topically daily.    ? Multiple Vitamins-Minerals (PRESERVISION/LUTEIN) CAPS Take 1  capsule by mouth 2 (two) times daily.    ? nitroGLYCERIN (NITROSTAT) 0.4 MG SL tablet Place 1 tablet (0.4 mg total) under the tongue every 5 (five) minutes as needed. For chest pain 25 tablet 2  ? pantopr

## 2021-07-20 ENCOUNTER — Inpatient Hospital Stay (HOSPITAL_COMMUNITY): Payer: Medicare Other

## 2021-07-20 ENCOUNTER — Encounter (HOSPITAL_COMMUNITY): Payer: Self-pay

## 2021-07-20 DIAGNOSIS — D630 Anemia in neoplastic disease: Secondary | ICD-10-CM | POA: Diagnosis not present

## 2021-07-20 DIAGNOSIS — Z5112 Encounter for antineoplastic immunotherapy: Secondary | ICD-10-CM | POA: Diagnosis not present

## 2021-07-20 DIAGNOSIS — C9000 Multiple myeloma not having achieved remission: Secondary | ICD-10-CM

## 2021-07-20 DIAGNOSIS — Z79899 Other long term (current) drug therapy: Secondary | ICD-10-CM | POA: Diagnosis not present

## 2021-07-20 MED ORDER — SODIUM CHLORIDE 0.9% IV SOLUTION
250.0000 mL | Freq: Once | INTRAVENOUS | Status: AC
  Administered 2021-07-20: 250 mL via INTRAVENOUS

## 2021-07-20 MED ORDER — ACETAMINOPHEN 325 MG PO TABS
650.0000 mg | ORAL_TABLET | Freq: Once | ORAL | Status: AC
  Administered 2021-07-20: 650 mg via ORAL
  Filled 2021-07-20: qty 2

## 2021-07-20 MED ORDER — DIPHENHYDRAMINE HCL 25 MG PO CAPS
25.0000 mg | ORAL_CAPSULE | Freq: Once | ORAL | Status: AC
  Administered 2021-07-20: 25 mg via ORAL
  Filled 2021-07-20: qty 1

## 2021-07-20 NOTE — Progress Notes (Signed)
Patient presents today for 2 Units of PRBC's. MAR reviewed and updated. Patient denies any fatigue, shortness of breath, dizziness or chest pains.  ? ?2 units of blood given today per MD orders. Tolerated infusion without adverse affects. Vital signs stable. No complaints at this time. Discharged from clinic ambulatory in stable condition. Alert and oriented x 3. F/U with [facility] as scheduled.   ? ?

## 2021-07-20 NOTE — Patient Instructions (Signed)
[facility]  Discharge Instructions: ?Thank you for choosing [facility] to provide your oncology and hematology care.  ?If you have a lab appointment with the [facility], please come in thru the Main Entrance and check in at the main information desk. ? ?Wear comfortable clothing and clothing appropriate for easy access to any Portacath or PICC line.  ? ?We strive to give you quality time with your provider. You may need to reschedule your appointment if you arrive late (15 or more minutes).  Arriving late affects you and other patients whose appointments are after yours.  Also, if you miss three or more appointments without notifying the office, you may be dismissed from the clinic at the provider?s discretion.    ?  ?For prescription refill requests, have your pharmacy contact our office and allow 72 hours for refills to be completed.   ? ?Today you received 2 units of PRBC's     ?  ?To help prevent nausea and vomiting after your treatment, we encourage you to take your nausea medication as directed. ? ?BELOW ARE SYMPTOMS THAT SHOULD BE REPORTED IMMEDIATELY: ?*FEVER GREATER THAN 100.4 F (38 ?C) OR HIGHER ?*CHILLS OR SWEATING ?*NAUSEA AND VOMITING THAT IS NOT CONTROLLED WITH YOUR NAUSEA MEDICATION ?*UNUSUAL SHORTNESS OF BREATH ?*UNUSUAL BRUISING OR BLEEDING ?*URINARY PROBLEMS (pain or burning when urinating, or frequent urination) ?*BOWEL PROBLEMS (unusual diarrhea, constipation, pain near the anus) ?TENDERNESS IN MOUTH AND THROAT WITH OR WITHOUT PRESENCE OF ULCERS (sore throat, sores in mouth, or a toothache) ?UNUSUAL RASH, SWELLING OR PAIN  ?UNUSUAL VAGINAL DISCHARGE OR ITCHING  ? ?Items with * indicate a potential emergency and should be followed up as soon as possible or go to the Emergency Department if any problems should occur. ? ?Please show the CHEMOTHERAPY ALERT CARD or IMMUNOTHERAPY ALERT CARD at check-in to the Emergency Department and triage nurse. ? ?Should you have  questions after your visit or need to cancel or reschedule your appointment, please contact [facility] [phone]  and follow the prompts.  Office hours are 8:00 a.m. to 4:30 p.m. Monday - Friday. Please note that voicemails left after 4:00 p.m. may not be returned until the following business day.  We are closed weekends and major holidays. You have access to a nurse at all times for urgent questions. Please call the main number to the clinic [phone] and follow the prompts. ? ?For any non-urgent questions, you may also contact your provider using MyChart. We now offer e-Visits for anyone 47 and older to request care online for non-urgent symptoms. For details visit [email or web address] ?  ?Also download the MyChart app! Go to the app store, search "MyChart", open the app, select [facility], and log in with your MyChart username and password. ? ?Due to Covid, a mask is required upon entering the hospital/clinic. If you do not have a mask, one will be given to you upon arrival. For doctor visits, patients may have 1 support person aged 52 or older with them. For treatment visits, patients cannot have anyone with them due to current Covid guidelines and our immunocompromised population.  ?

## 2021-07-22 LAB — BPAM RBC
Blood Product Expiration Date: 202304192359
Blood Product Expiration Date: 202304202359
ISSUE DATE / TIME: 202303170947
ISSUE DATE / TIME: 202303171133
Unit Type and Rh: 5100
Unit Type and Rh: 5100

## 2021-07-22 LAB — TYPE AND SCREEN
ABO/RH(D): O POS
Antibody Screen: NEGATIVE
Unit division: 0
Unit division: 0

## 2021-07-26 ENCOUNTER — Other Ambulatory Visit: Payer: Self-pay

## 2021-07-26 ENCOUNTER — Inpatient Hospital Stay (HOSPITAL_COMMUNITY): Payer: Medicare Other

## 2021-07-26 VITALS — BP 157/63 | HR 71 | Temp 98.5°F | Resp 18

## 2021-07-26 DIAGNOSIS — H409 Unspecified glaucoma: Secondary | ICD-10-CM | POA: Diagnosis present

## 2021-07-26 DIAGNOSIS — D860 Sarcoidosis of lung: Secondary | ICD-10-CM | POA: Diagnosis present

## 2021-07-26 DIAGNOSIS — E039 Hypothyroidism, unspecified: Secondary | ICD-10-CM | POA: Diagnosis not present

## 2021-07-26 DIAGNOSIS — G9341 Metabolic encephalopathy: Secondary | ICD-10-CM | POA: Diagnosis not present

## 2021-07-26 DIAGNOSIS — N400 Enlarged prostate without lower urinary tract symptoms: Secondary | ICD-10-CM | POA: Diagnosis present

## 2021-07-26 DIAGNOSIS — K219 Gastro-esophageal reflux disease without esophagitis: Secondary | ICD-10-CM | POA: Diagnosis present

## 2021-07-26 DIAGNOSIS — I5032 Chronic diastolic (congestive) heart failure: Secondary | ICD-10-CM | POA: Diagnosis present

## 2021-07-26 DIAGNOSIS — D693 Immune thrombocytopenic purpura: Secondary | ICD-10-CM | POA: Diagnosis not present

## 2021-07-26 DIAGNOSIS — E785 Hyperlipidemia, unspecified: Secondary | ICD-10-CM | POA: Diagnosis present

## 2021-07-26 DIAGNOSIS — Z681 Body mass index (BMI) 19 or less, adult: Secondary | ICD-10-CM | POA: Diagnosis not present

## 2021-07-26 DIAGNOSIS — Z87442 Personal history of urinary calculi: Secondary | ICD-10-CM | POA: Diagnosis not present

## 2021-07-26 DIAGNOSIS — C9000 Multiple myeloma not having achieved remission: Secondary | ICD-10-CM | POA: Diagnosis not present

## 2021-07-26 DIAGNOSIS — R778 Other specified abnormalities of plasma proteins: Secondary | ICD-10-CM | POA: Diagnosis not present

## 2021-07-26 DIAGNOSIS — E43 Unspecified severe protein-calorie malnutrition: Secondary | ICD-10-CM | POA: Diagnosis not present

## 2021-07-26 DIAGNOSIS — I25110 Atherosclerotic heart disease of native coronary artery with unstable angina pectoris: Secondary | ICD-10-CM | POA: Diagnosis present

## 2021-07-26 DIAGNOSIS — J189 Pneumonia, unspecified organism: Secondary | ICD-10-CM | POA: Diagnosis not present

## 2021-07-26 DIAGNOSIS — R079 Chest pain, unspecified: Secondary | ICD-10-CM | POA: Diagnosis present

## 2021-07-26 DIAGNOSIS — I25119 Atherosclerotic heart disease of native coronary artery with unspecified angina pectoris: Secondary | ICD-10-CM | POA: Diagnosis not present

## 2021-07-26 DIAGNOSIS — I110 Hypertensive heart disease with heart failure: Secondary | ICD-10-CM | POA: Diagnosis not present

## 2021-07-26 DIAGNOSIS — Z8711 Personal history of peptic ulcer disease: Secondary | ICD-10-CM | POA: Diagnosis not present

## 2021-07-26 DIAGNOSIS — D696 Thrombocytopenia, unspecified: Secondary | ICD-10-CM

## 2021-07-26 DIAGNOSIS — D638 Anemia in other chronic diseases classified elsewhere: Secondary | ICD-10-CM | POA: Diagnosis not present

## 2021-07-26 DIAGNOSIS — J45909 Unspecified asthma, uncomplicated: Secondary | ICD-10-CM | POA: Diagnosis not present

## 2021-07-26 DIAGNOSIS — I10 Essential (primary) hypertension: Secondary | ICD-10-CM | POA: Diagnosis not present

## 2021-07-26 DIAGNOSIS — D61818 Other pancytopenia: Secondary | ICD-10-CM | POA: Diagnosis not present

## 2021-07-26 DIAGNOSIS — R0789 Other chest pain: Secondary | ICD-10-CM | POA: Diagnosis not present

## 2021-07-26 DIAGNOSIS — Z87891 Personal history of nicotine dependence: Secondary | ICD-10-CM | POA: Diagnosis not present

## 2021-07-26 DIAGNOSIS — D649 Anemia, unspecified: Secondary | ICD-10-CM

## 2021-07-26 DIAGNOSIS — E871 Hypo-osmolality and hyponatremia: Secondary | ICD-10-CM | POA: Diagnosis not present

## 2021-07-26 DIAGNOSIS — Z8249 Family history of ischemic heart disease and other diseases of the circulatory system: Secondary | ICD-10-CM | POA: Diagnosis not present

## 2021-07-26 DIAGNOSIS — R0600 Dyspnea, unspecified: Secondary | ICD-10-CM | POA: Diagnosis not present

## 2021-07-26 DIAGNOSIS — R011 Cardiac murmur, unspecified: Secondary | ICD-10-CM | POA: Diagnosis present

## 2021-07-26 DIAGNOSIS — J180 Bronchopneumonia, unspecified organism: Secondary | ICD-10-CM | POA: Diagnosis not present

## 2021-07-26 DIAGNOSIS — J90 Pleural effusion, not elsewhere classified: Secondary | ICD-10-CM | POA: Diagnosis not present

## 2021-07-26 DIAGNOSIS — R072 Precordial pain: Secondary | ICD-10-CM | POA: Diagnosis not present

## 2021-07-26 LAB — CBC WITH DIFFERENTIAL/PLATELET
Abs Immature Granulocytes: 0.03 10*3/uL (ref 0.00–0.07)
Basophils Absolute: 0 10*3/uL (ref 0.0–0.1)
Basophils Relative: 0 %
Eosinophils Absolute: 0 10*3/uL (ref 0.0–0.5)
Eosinophils Relative: 1 %
HCT: 25.8 % — ABNORMAL LOW (ref 39.0–52.0)
Hemoglobin: 8.2 g/dL — ABNORMAL LOW (ref 13.0–17.0)
Immature Granulocytes: 1 %
Lymphocytes Relative: 19 %
Lymphs Abs: 0.9 10*3/uL (ref 0.7–4.0)
MCH: 34.5 pg — ABNORMAL HIGH (ref 26.0–34.0)
MCHC: 31.8 g/dL (ref 30.0–36.0)
MCV: 108.4 fL — ABNORMAL HIGH (ref 80.0–100.0)
Monocytes Absolute: 0.9 10*3/uL (ref 0.1–1.0)
Monocytes Relative: 18 %
Neutro Abs: 3 10*3/uL (ref 1.7–7.7)
Neutrophils Relative %: 61 %
Platelets: 61 10*3/uL — ABNORMAL LOW (ref 150–400)
RBC: 2.38 MIL/uL — ABNORMAL LOW (ref 4.22–5.81)
RDW: 22.4 % — ABNORMAL HIGH (ref 11.5–15.5)
WBC: 4.9 10*3/uL (ref 4.0–10.5)
nRBC: 0 % (ref 0.0–0.2)

## 2021-07-26 LAB — SAMPLE TO BLOOD BANK

## 2021-07-26 LAB — COMPREHENSIVE METABOLIC PANEL
ALT: 19 U/L (ref 0–44)
AST: 14 U/L — ABNORMAL LOW (ref 15–41)
Albumin: 2.8 g/dL — ABNORMAL LOW (ref 3.5–5.0)
Alkaline Phosphatase: 63 U/L (ref 38–126)
Anion gap: 4 — ABNORMAL LOW (ref 5–15)
BUN: 21 mg/dL (ref 8–23)
CO2: 26 mmol/L (ref 22–32)
Calcium: 8.9 mg/dL (ref 8.9–10.3)
Chloride: 104 mmol/L (ref 98–111)
Creatinine, Ser: 0.79 mg/dL (ref 0.61–1.24)
GFR, Estimated: >60 mL/min (ref >60)
Glucose, Bld: 99 mg/dL (ref 70–99)
Potassium: 3.6 mmol/L (ref 3.5–5.1)
Sodium: 134 mmol/L — ABNORMAL LOW (ref 135–145)
Total Bilirubin: 0.7 mg/dL (ref 0.3–1.2)
Total Protein: 8.8 g/dL — ABNORMAL HIGH (ref 6.5–8.1)

## 2021-07-26 LAB — MAGNESIUM: Magnesium: 1.9 mg/dL (ref 1.7–2.4)

## 2021-07-26 MED ORDER — BORTEZOMIB CHEMO SQ INJECTION 3.5 MG (2.5MG/ML)
1.0000 mg/m2 | Freq: Once | INTRAMUSCULAR | Status: AC
  Administered 2021-07-26: 1.75 mg via SUBCUTANEOUS
  Filled 2021-07-26: qty 0.7

## 2021-07-26 NOTE — Patient Instructions (Signed)
[place] [name]  Discharge Instructions: ?Thank you for choosing [facility] to provide your oncology and hematology care.  ?If you have a lab appointment with the [facility], please come in thru the Main Entrance and check in at the main information desk. ? ?Wear comfortable clothing and clothing appropriate for easy access to any Portacath or PICC line.  ? ?We strive to give you quality time with your provider. You may need to reschedule your appointment if you arrive late (15 or more minutes).  Arriving late affects you and other patients whose appointments are after yours.  Also, if you miss three or more appointments without notifying the office, you may be dismissed from the clinic at the provider?s discretion.    ?  ?For prescription refill requests, have your pharmacy contact our office and allow 72 hours for refills to be completed.   ? ?Today you received the following chemotherapy and/or immunotherapy agents Velcade    ?  ?To help prevent nausea and vomiting after your treatment, we encourage you to take your nausea medication as directed. ? ?BELOW ARE SYMPTOMS THAT SHOULD BE REPORTED IMMEDIATELY: ?*FEVER GREATER THAN 100.4 F (38 ?C) OR HIGHER ?*CHILLS OR SWEATING ?*NAUSEA AND VOMITING THAT IS NOT CONTROLLED WITH YOUR NAUSEA MEDICATION ?*UNUSUAL SHORTNESS OF BREATH ?*UNUSUAL BRUISING OR BLEEDING ?*URINARY PROBLEMS (pain or burning when urinating, or frequent urination) ?*BOWEL PROBLEMS (unusual diarrhea, constipation, pain near the anus) ?TENDERNESS IN MOUTH AND THROAT WITH OR WITHOUT PRESENCE OF ULCERS (sore throat, sores in mouth, or a toothache) ?UNUSUAL RASH, SWELLING OR PAIN  ?UNUSUAL VAGINAL DISCHARGE OR ITCHING  ? ?Items with * indicate a potential emergency and should be followed up as soon as possible or go to the Emergency Department if any problems should occur. ? ?Please show the CHEMOTHERAPY ALERT CARD or IMMUNOTHERAPY ALERT CARD at check-in to the Emergency  Department and triage nurse. ? ?Should you have questions after your visit or need to cancel or reschedule your appointment, please contact [facility] [phone]  and follow the prompts.  Office hours are 8:00 a.m. to 4:30 p.m. Monday - Friday. Please note that voicemails left after 4:00 p.m. may not be returned until the following business day.  We are closed weekends and major holidays. You have access to a nurse at all times for urgent questions. Please call the main number to the clinic [phone] and follow the prompts. ? ?For any non-urgent questions, you may also contact your provider using MyChart. We now offer e-Visits for anyone 77 and older to request care online for non-urgent symptoms. For details visit [email or web address]. ?  ?Also download the MyChart app! Go to the app store, search "MyChart", open the app, select [facility], and log in with your MyChart username and password. ? ?Due to Covid, a mask is required upon entering the hospital/clinic. If you do not have a mask, one will be given to you upon arrival. For doctor visits, patients may have 1 support person aged 37 or older with them. For treatment visits, patients cannot have anyone with them due to current Covid guidelines and our immunocompromised population.  ?

## 2021-07-26 NOTE — Progress Notes (Signed)
Patient presents today for Velcade injection pre providers order.  Vital signs within parameters.  Labs pending.  Patient has some redness to abdomen from bandaide.  Will use gauze and paper tape this administration. ? ?Stable during administration without incident; injection site WNL; see MAR for injection details.  Patient tolerated procedure well and without incident.  No questions or complaints noted at this time. Discharge from clinic ambulatory in stable condition.  Alert and oriented X 3.  Follow up with [facility] as scheduled.  ?

## 2021-07-27 ENCOUNTER — Encounter (HOSPITAL_COMMUNITY): Payer: Self-pay

## 2021-07-27 ENCOUNTER — Other Ambulatory Visit: Payer: Self-pay

## 2021-07-27 ENCOUNTER — Inpatient Hospital Stay (HOSPITAL_COMMUNITY)
Admission: EM | Admit: 2021-07-27 | Discharge: 2021-07-30 | DRG: 193 | Disposition: A | Discharge status: Home Health | Payer: Medicare Other | Attending: Family Medicine | Admitting: Family Medicine

## 2021-07-27 ENCOUNTER — Other Ambulatory Visit: Payer: Self-pay | Admitting: Gastroenterology

## 2021-07-27 ENCOUNTER — Emergency Department (HOSPITAL_COMMUNITY): Payer: Medicare Other

## 2021-07-27 DIAGNOSIS — G9341 Metabolic encephalopathy: Secondary | ICD-10-CM | POA: Diagnosis present

## 2021-07-27 DIAGNOSIS — E871 Hypo-osmolality and hyponatremia: Secondary | ICD-10-CM | POA: Diagnosis present

## 2021-07-27 DIAGNOSIS — J45909 Unspecified asthma, uncomplicated: Secondary | ICD-10-CM | POA: Diagnosis present

## 2021-07-27 DIAGNOSIS — Z885 Allergy status to narcotic agent status: Secondary | ICD-10-CM

## 2021-07-27 DIAGNOSIS — R011 Cardiac murmur, unspecified: Secondary | ICD-10-CM | POA: Diagnosis present

## 2021-07-27 DIAGNOSIS — Z8249 Family history of ischemic heart disease and other diseases of the circulatory system: Secondary | ICD-10-CM

## 2021-07-27 DIAGNOSIS — Z7982 Long term (current) use of aspirin: Secondary | ICD-10-CM

## 2021-07-27 DIAGNOSIS — Z955 Presence of coronary angioplasty implant and graft: Secondary | ICD-10-CM

## 2021-07-27 DIAGNOSIS — D696 Thrombocytopenia, unspecified: Secondary | ICD-10-CM | POA: Diagnosis present

## 2021-07-27 DIAGNOSIS — Z7952 Long term (current) use of systemic steroids: Secondary | ICD-10-CM

## 2021-07-27 DIAGNOSIS — E039 Hypothyroidism, unspecified: Secondary | ICD-10-CM | POA: Diagnosis not present

## 2021-07-27 DIAGNOSIS — H3530 Unspecified macular degeneration: Secondary | ICD-10-CM | POA: Diagnosis present

## 2021-07-27 DIAGNOSIS — Z882 Allergy status to sulfonamides status: Secondary | ICD-10-CM

## 2021-07-27 DIAGNOSIS — C9000 Multiple myeloma not having achieved remission: Secondary | ICD-10-CM | POA: Diagnosis not present

## 2021-07-27 DIAGNOSIS — R072 Precordial pain: Secondary | ICD-10-CM | POA: Diagnosis not present

## 2021-07-27 DIAGNOSIS — R0789 Other chest pain: Secondary | ICD-10-CM | POA: Diagnosis present

## 2021-07-27 DIAGNOSIS — Z888 Allergy status to other drugs, medicaments and biological substances status: Secondary | ICD-10-CM

## 2021-07-27 DIAGNOSIS — Z881 Allergy status to other antibiotic agents status: Secondary | ICD-10-CM

## 2021-07-27 DIAGNOSIS — I25119 Atherosclerotic heart disease of native coronary artery with unspecified angina pectoris: Secondary | ICD-10-CM | POA: Diagnosis not present

## 2021-07-27 DIAGNOSIS — R079 Chest pain, unspecified: Principal | ICD-10-CM

## 2021-07-27 DIAGNOSIS — Z79899 Other long term (current) drug therapy: Secondary | ICD-10-CM

## 2021-07-27 DIAGNOSIS — D860 Sarcoidosis of lung: Secondary | ICD-10-CM | POA: Diagnosis present

## 2021-07-27 DIAGNOSIS — I200 Unstable angina: Secondary | ICD-10-CM | POA: Diagnosis present

## 2021-07-27 DIAGNOSIS — E43 Unspecified severe protein-calorie malnutrition: Secondary | ICD-10-CM | POA: Diagnosis present

## 2021-07-27 DIAGNOSIS — Z681 Body mass index (BMI) 19 or less, adult: Secondary | ICD-10-CM

## 2021-07-27 DIAGNOSIS — I110 Hypertensive heart disease with heart failure: Secondary | ICD-10-CM | POA: Diagnosis present

## 2021-07-27 DIAGNOSIS — J189 Pneumonia, unspecified organism: Secondary | ICD-10-CM | POA: Diagnosis present

## 2021-07-27 DIAGNOSIS — N400 Enlarged prostate without lower urinary tract symptoms: Secondary | ICD-10-CM | POA: Diagnosis present

## 2021-07-27 DIAGNOSIS — Z87442 Personal history of urinary calculi: Secondary | ICD-10-CM

## 2021-07-27 DIAGNOSIS — H409 Unspecified glaucoma: Secondary | ICD-10-CM | POA: Diagnosis present

## 2021-07-27 DIAGNOSIS — I2510 Atherosclerotic heart disease of native coronary artery without angina pectoris: Secondary | ICD-10-CM | POA: Diagnosis present

## 2021-07-27 DIAGNOSIS — D693 Immune thrombocytopenic purpura: Secondary | ICD-10-CM | POA: Diagnosis not present

## 2021-07-27 DIAGNOSIS — K219 Gastro-esophageal reflux disease without esophagitis: Secondary | ICD-10-CM | POA: Diagnosis present

## 2021-07-27 DIAGNOSIS — I10 Essential (primary) hypertension: Secondary | ICD-10-CM | POA: Diagnosis not present

## 2021-07-27 DIAGNOSIS — R778 Other specified abnormalities of plasma proteins: Secondary | ICD-10-CM

## 2021-07-27 DIAGNOSIS — E785 Hyperlipidemia, unspecified: Secondary | ICD-10-CM | POA: Diagnosis present

## 2021-07-27 DIAGNOSIS — Z8711 Personal history of peptic ulcer disease: Secondary | ICD-10-CM

## 2021-07-27 DIAGNOSIS — D61818 Other pancytopenia: Secondary | ICD-10-CM | POA: Diagnosis present

## 2021-07-27 DIAGNOSIS — I25110 Atherosclerotic heart disease of native coronary artery with unstable angina pectoris: Secondary | ICD-10-CM | POA: Diagnosis present

## 2021-07-27 DIAGNOSIS — I5032 Chronic diastolic (congestive) heart failure: Secondary | ICD-10-CM | POA: Diagnosis present

## 2021-07-27 DIAGNOSIS — I440 Atrioventricular block, first degree: Secondary | ICD-10-CM | POA: Diagnosis present

## 2021-07-27 DIAGNOSIS — D638 Anemia in other chronic diseases classified elsewhere: Secondary | ICD-10-CM | POA: Diagnosis present

## 2021-07-27 DIAGNOSIS — Z886 Allergy status to analgesic agent status: Secondary | ICD-10-CM

## 2021-07-27 DIAGNOSIS — J180 Bronchopneumonia, unspecified organism: Principal | ICD-10-CM | POA: Diagnosis present

## 2021-07-27 DIAGNOSIS — Z880 Allergy status to penicillin: Secondary | ICD-10-CM

## 2021-07-27 DIAGNOSIS — Z79890 Hormone replacement therapy (postmenopausal): Secondary | ICD-10-CM

## 2021-07-27 DIAGNOSIS — Z87891 Personal history of nicotine dependence: Secondary | ICD-10-CM

## 2021-07-27 LAB — BASIC METABOLIC PANEL
Anion gap: 6 (ref 5–15)
BUN: 17 mg/dL (ref 8–23)
CO2: 24 mmol/L (ref 22–32)
Calcium: 9.2 mg/dL (ref 8.9–10.3)
Chloride: 103 mmol/L (ref 98–111)
Creatinine, Ser: 0.81 mg/dL (ref 0.61–1.24)
GFR, Estimated: >60 mL/min (ref >60)
Glucose, Bld: 115 mg/dL — ABNORMAL HIGH (ref 70–99)
Potassium: 3.7 mmol/L (ref 3.5–5.1)
Sodium: 133 mmol/L — ABNORMAL LOW (ref 135–145)

## 2021-07-27 LAB — TROPONIN I (HIGH SENSITIVITY)
Troponin I (High Sensitivity): 28 ng/L — ABNORMAL HIGH (ref <18)
Troponin I (High Sensitivity): 30 ng/L — ABNORMAL HIGH (ref <18)

## 2021-07-27 LAB — CBC
HCT: 27.8 % — ABNORMAL LOW (ref 39.0–52.0)
Hemoglobin: 8.9 g/dL — ABNORMAL LOW (ref 13.0–17.0)
MCH: 34 pg (ref 26.0–34.0)
MCHC: 32 g/dL (ref 30.0–36.0)
MCV: 106.1 fL — ABNORMAL HIGH (ref 80.0–100.0)
Platelets: 65 10*3/uL — ABNORMAL LOW (ref 150–400)
RBC: 2.62 MIL/uL — ABNORMAL LOW (ref 4.22–5.81)
RDW: 22.8 % — ABNORMAL HIGH (ref 11.5–15.5)
WBC: 7.3 10*3/uL (ref 4.0–10.5)
nRBC: 0 % (ref 0.0–0.2)

## 2021-07-27 LAB — BRAIN NATRIURETIC PEPTIDE: B Natriuretic Peptide: 642 pg/mL — ABNORMAL HIGH (ref 0.0–100.0)

## 2021-07-27 LAB — PROTIME-INR
INR: 1.4 — ABNORMAL HIGH (ref 0.8–1.2)
Prothrombin Time: 17.2 seconds — ABNORMAL HIGH (ref 11.4–15.2)

## 2021-07-27 LAB — PROCALCITONIN: Procalcitonin: <0.1 ng/mL

## 2021-07-27 MED ORDER — POTASSIUM CHLORIDE CRYS ER 20 MEQ PO TBCR
20.0000 meq | EXTENDED_RELEASE_TABLET | Freq: Every day | ORAL | Status: DC
  Administered 2021-07-28 – 2021-07-30 (×3): 20 meq via ORAL
  Filled 2021-07-27 (×3): qty 1

## 2021-07-27 MED ORDER — METOPROLOL TARTRATE 25 MG PO TABS
25.0000 mg | ORAL_TABLET | Freq: Every day | ORAL | Status: DC
  Administered 2021-07-28 – 2021-07-30 (×3): 25 mg via ORAL
  Filled 2021-07-27 (×3): qty 1

## 2021-07-27 MED ORDER — CEFDINIR 300 MG PO CAPS
300.0000 mg | ORAL_CAPSULE | Freq: Two times a day (BID) | ORAL | Status: DC
Start: 2021-07-27 — End: 2021-07-28
  Administered 2021-07-27 – 2021-07-28 (×2): 300 mg via ORAL
  Filled 2021-07-27 (×2): qty 1

## 2021-07-27 MED ORDER — ONDANSETRON HCL 4 MG PO TABS: 4.0000 mg | ORAL_TABLET | Freq: Four times a day (QID) | ORAL | Status: DC | PRN

## 2021-07-27 MED ORDER — ISOSORBIDE MONONITRATE ER 60 MG PO TB24
30.0000 mg | ORAL_TABLET | Freq: Every day | ORAL | Status: DC
  Administered 2021-07-27 – 2021-07-29 (×3): 30 mg via ORAL
  Filled 2021-07-27 (×3): qty 1

## 2021-07-27 MED ORDER — ONDANSETRON HCL 4 MG/2ML IJ SOLN
4.0000 mg | Freq: Four times a day (QID) | INTRAMUSCULAR | Status: DC | PRN
  Filled 2021-07-27: qty 2

## 2021-07-27 MED ORDER — ENOXAPARIN SODIUM 40 MG/0.4ML IJ SOSY
40.0000 mg | PREFILLED_SYRINGE | INTRAMUSCULAR | Status: DC
  Administered 2021-07-28: 40 mg via SUBCUTANEOUS
  Filled 2021-07-27: qty 0.4

## 2021-07-27 MED ORDER — ATORVASTATIN CALCIUM 40 MG PO TABS
40.0000 mg | ORAL_TABLET | Freq: Every evening | ORAL | Status: DC
  Administered 2021-07-28 – 2021-07-29 (×2): 40 mg via ORAL
  Filled 2021-07-27 (×2): qty 1

## 2021-07-27 MED ORDER — ISOSORBIDE MONONITRATE ER 60 MG PO TB24
90.0000 mg | ORAL_TABLET | Freq: Every day | ORAL | Status: DC
Start: 2021-07-28 — End: 2021-07-30
  Administered 2021-07-28 – 2021-07-30 (×3): 90 mg via ORAL
  Filled 2021-07-27 (×3): qty 2

## 2021-07-27 MED ORDER — METOPROLOL TARTRATE 25 MG PO TABS
12.5000 mg | ORAL_TABLET | Freq: Every day | ORAL | Status: DC
  Administered 2021-07-27 – 2021-07-29 (×3): 12.5 mg via ORAL
  Filled 2021-07-27 (×3): qty 1

## 2021-07-27 MED ORDER — LOSARTAN POTASSIUM 50 MG PO TABS
100.0000 mg | ORAL_TABLET | Freq: Every day | ORAL | Status: DC
  Administered 2021-07-27 – 2021-07-30 (×4): 100 mg via ORAL
  Filled 2021-07-27 (×3): qty 2
  Filled 2021-07-27: qty 4

## 2021-07-27 MED ORDER — LEVOTHYROXINE SODIUM 75 MCG PO TABS
75.0000 ug | ORAL_TABLET | Freq: Every day | ORAL | Status: DC
  Administered 2021-07-28 – 2021-07-30 (×3): 75 ug via ORAL
  Filled 2021-07-27 (×3): qty 1

## 2021-07-27 MED ORDER — PANTOPRAZOLE SODIUM 40 MG PO TBEC
40.0000 mg | DELAYED_RELEASE_TABLET | Freq: Every day | ORAL | Status: DC
  Administered 2021-07-28 – 2021-07-30 (×3): 40 mg via ORAL
  Filled 2021-07-27 (×3): qty 1

## 2021-07-27 MED ORDER — ASPIRIN 81 MG PO CHEW
81.0000 mg | CHEWABLE_TABLET | ORAL | Status: DC
  Administered 2021-07-30: 81 mg via ORAL
  Filled 2021-07-27: qty 1

## 2021-07-27 NOTE — Telephone Encounter (Signed)
Last ov 08/09/20 ?

## 2021-07-27 NOTE — ED Provider Notes (Addendum)
?[facility] ?Provider Note ? ? ?CSN: [id number] ?Arrival date & time: 07/27/21  1548 ? ?  ? ?History ? ?Chief Complaint  ?Patient presents with  ? Chest Pain  ? ? ?Dale [name] is a 88 y.o. male. ? ?HPI ?He presents for evaluation of right-sided chest pain and nausea which started last night.  He has been using nitroglycerin and has taken sublingual nitroglycerin at least 8 times without change in his pain.  The pain is persistent and unrelenting.  He did not sleep much last night because of the pain.  He states that he typically does not use nitroglycerin.  He is currently being treated for "cancer with shots."  He cannot recall what they are.  He has a remote history of coronary disease and coronary stenting.  Pertinent comorbidities also include hypertension, CHF, history of hematochezia, history of ITP, history of first-degree AV block and multiple myeloma. ? ? ?  ? ?Home Medications ?Prior to Admission medications   ?Medication Sig Start Date End Date Taking? Authorizing Provider  ?acyclovir (ZOVIRAX) 400 MG tablet Take 1 tablet (400 mg total) by mouth 2 (two) times daily. 06/25/21   Derek Jack, MD  ?aspirin 81 MG tablet Take 81 mg by mouth every Monday, Wednesday, and Friday.    [provider]  ?atorvastatin (LIPITOR) 40 MG tablet TAKE 1 TABLET BY MOUTH IN  THE EVENING ?Patient taking differently: Take 40 mg by mouth every evening. 10/23/20   Troy Sine, MD  ?atropine 1 % ophthalmic solution  01/10/21   [provider]  ?Bortezomib (VELCADE IJ) Inject as directed. Three weeks on and one weeks off.    [provider]  ?brimonidine (ALPHAGAN) 0.2 % ophthalmic solution Place 1 drop into both eyes 2 (two) times daily.    [provider]  ?dexamethasone (DECADRON) 2 MG tablet TAKE 5 TABLETS(10 MG) BY MOUTH 1 TIME A WEEK 06/25/21   Derek Jack, MD  ?dorzolamide-timolol (COSOPT) 22.3-6.8 MG/ML ophthalmic solution Place 1 drop into both eyes  2 (two) times daily.    [provider]  ?fluocinonide cream (LIDEX) 5.42 % Apply 1 application. topically 2 (two) times daily. 01/05/21   [provider]  ?furosemide (LASIX) 20 MG tablet If you notice any swelling, you may take 40 mg (two tablets) for the day. ?Patient taking differently: 40 mg daily as needed for fluid. 03/28/21   Troy Sine, MD  ?isosorbide mononitrate (IMDUR) 60 MG 24 hr tablet TAKE 1 AND 1/2 TABLETS BY  MOUTH IN THE MORNING AND  1/2 TABLET AT NIGHT 07/02/21   Troy Sine, MD  ?lenalidomide (REVLIMID) 5 MG capsule Take 1 capsule (5 mg total) by mouth daily. 14 days on, 14 days off 05/22/21   Derek Jack, MD  ?levothyroxine (SYNTHROID) 100 MCG tablet Take 100 mcg by mouth daily before breakfast.    [provider]  ?levothyroxine (SYNTHROID) 125 MCG tablet Take 125 mcg by mouth daily. 03/17/21   [provider]  ?levothyroxine (SYNTHROID) 75 MCG tablet Take 75 mcg by mouth daily. 07/16/21   [provider]  ?losartan (COZAAR) 100 MG tablet TAKE 1 TABLET BY MOUTH  DAILY 01/15/21   Troy Sine, MD  ?metoCLOPramide (REGLAN) 10 MG tablet Take 1 tablet (10 mg total) by mouth every 6 (six) hours as needed for nausea or vomiting. 05/23/21   Evalee Jefferson, PA-C  ?metoprolol tartrate (LOPRESSOR) 25 MG tablet TAKE 1 TABLET BY MOUTH IN  THE MORNING  AND ONE-HALF  TABLET BY MOUTH IN THE  EVENING 07/13/21   Troy Sine, MD  ?mometasone (ELOCON) 0.1 % ointment Apply 1 application. topically daily.    [provider]  ?moxifloxacin (VIGAMOX) 0.5 % ophthalmic solution Apply to eye. 07/17/21   [provider]  ?moxifloxacin (VIGAMOX) 0.5 % ophthalmic solution Insert one drop 3 times a day into surgical eye starting 4 days prior to surgery 07/13/21   [provider]  ?Multiple Vitamins-Minerals (PRESERVISION/LUTEIN) CAPS Take 1 capsule by mouth 2 (two) times daily.    [provider]  ?nitroGLYCERIN (NITROSTAT) 0.4 MG SL  tablet Place 1 tablet (0.4 mg total) under the tongue every 5 (five) minutes as needed. For chest pain 09/27/20   Troy Sine, MD  ?pantoprazole (PROTONIX) 40 MG tablet TAKE 1 TABLET BY MOUTH  DAILY BEFORE BREAKFAST 07/27/21   Rourk, Cristopher Estimable, MD  ?potassium chloride SA (KLOR-CON M) 20 MEQ tablet TAKE 1 TABLET BY MOUTH  DAILY 05/28/21   Troy Sine, MD  ?prochlorperazine (COMPAZINE) 10 MG tablet Take 1 tablet (10 mg total) by mouth every 6 (six) hours as needed for nausea or vomiting. 01/15/21   Derek Jack, MD  ?ROCKLATAN 0.02-0.005 % SOLN Apply 1 drop to eye at bedtime. 12/21/19   [provider]  ?triamcinolone (KENALOG) 0.1 % cream Apply 1 application. topically 2 (two) times daily as needed (for irritation).    [provider]  ?vitamin C (ASCORBIC ACID) 500 MG tablet Take 500 mg by mouth daily.    [provider]  ?vitamin E 200 UNIT capsule Take 200 Units by mouth daily at 12 noon.     [provider]  ?   ? ?Allergies    ?Azithromycin, Doxazosin, Acetaminophen, Atenolol, Hydrocodone, Hydrocodone-acetaminophen, Levofloxacin, Morphine, Penicillins, and Sulfonamide derivatives   ? ?Review of Systems   ?Review of Systems ? ?Physical Exam ?Updated Vital Signs ?BP (!) 185/66   Pulse 79   Temp 98.4 ?F (36.9 ?C)   Resp 16   Ht '5\' 8"'  (1.727 m)   Wt 56.7 kg   SpO2 99%   BMI 19.01 kg/m?  ?Physical Exam ?Vitals and nursing note reviewed.  ?Constitutional:   ?   General: He is not in acute distress. ?   Appearance: He is well-developed. He is not ill-appearing, toxic-appearing or diaphoretic.  ?HENT:  ?   Head: Normocephalic and atraumatic.  ?   Right Ear: External ear normal.  ?   Left Ear: External ear normal.  ?Eyes:  ?   Conjunctiva/sclera: Conjunctivae normal.  ?   Pupils: Pupils are equal, round, and reactive to light.  ?Neck:  ?   Trachea: Phonation normal.  ?Cardiovascular:  ?   Rate and Rhythm: Normal rate and regular rhythm.  ?   Heart sounds: Normal heart  sounds.  ?Pulmonary:  ?   Effort: Pulmonary effort is normal. No respiratory distress.  ?   Breath sounds: Normal breath sounds. No stridor. No rales.  ?Chest:  ?   Chest wall: No tenderness.  ?Abdominal:  ?   General: There is no distension.  ?   Palpations: Abdomen is soft.  ?   Tenderness: There is no abdominal tenderness.  ?Musculoskeletal:     ?   General: Normal range of motion.  ?   Cervical back: Normal range of motion and neck supple.  ?   Right lower leg: No edema.  ?   Left lower leg: No edema.  ?Skin: ?  General: Skin is warm and dry.  ?Neurological:  ?   Mental Status: He is alert and oriented to person, place, and time.  ?   Cranial Nerves: No cranial nerve deficit.  ?   Sensory: No sensory deficit.  ?   Motor: No abnormal muscle tone.  ?   Coordination: Coordination normal.  ?Psychiatric:     ?   Mood and Affect: Mood normal.     ?   Behavior: Behavior normal.     ?   Thought Content: Thought content normal.     ?   Judgment: Judgment normal.  ? ? ?ED Results / Procedures / Treatments   ?Labs ?(all labs ordered are listed, but only abnormal results are displayed) ?Labs Reviewed  ?BASIC METABOLIC PANEL - Abnormal; Notable for the following components:  ?    Result Value  ? Sodium 133 (*)   ? Glucose, Bld 115 (*)   ? All other components within normal limits  ?CBC - Abnormal; Notable for the following components:  ? RBC 2.62 (*)   ? Hemoglobin 8.9 (*)   ? HCT 27.8 (*)   ? MCV 106.1 (*)   ? RDW 22.8 (*)   ? Platelets 65 (*)   ? All other components within normal limits  ?BRAIN NATRIURETIC PEPTIDE - Abnormal; Notable for the following components:  ? B Natriuretic Peptide 642.0 (*)   ? All other components within normal limits  ?PROTIME-INR - Abnormal; Notable for the following components:  ? Prothrombin Time 17.2 (*)   ? INR 1.4 (*)   ? All other components within normal limits  ?TROPONIN I (HIGH SENSITIVITY) - Abnormal; Notable for the following components:  ? Troponin I (High Sensitivity) 28 (*)   ?  All other components within normal limits  ?TROPONIN I (HIGH SENSITIVITY) - Abnormal; Notable for the following components:  ? Troponin I (High Sensitivity) 30 (*)   ? All other components within normal limi

## 2021-07-27 NOTE — ED Notes (Signed)
Pt used urinal at bedside  

## 2021-07-27 NOTE — H&P (Addendum)
?History and Physical  ? ? ?Patient: Dale [name] JGO:[id number] DOB: [birth year] ?DOA: 07/27/2021 ?DOS: the patient was seen and examined on 07/27/2021 ?PCP: Redmond School, MD  ?Patient coming from: Home ? ?Chief Complaint:  ?Chief Complaint  ?Patient presents with  ? Chest Pain  ? ?HPI: Dale [name] is a 88 y.o. male with medical history significant of multiple myeloma, ITP, hypertension, coronary artery disease with history of stenting in 2013, history of heart failure with normal EF, hypertension.  Patient presents with right-sided severe chest pain that started last night and has been continuous.  No palliating or provoking factors.  Patient took 8 doses of nitroglycerin without improvement.  He came today because the chest pain was still occurring.  At some point in his work-up, his chest pain improved. ? ?He denies cough, shortness of breath.  He does have some hoarseness that has been going on since the day before yesterday. ? ?Review of Systems: As mentioned in the history of present illness. All other systems reviewed and are negative. ?Past Medical History:  ?Diagnosis Date  ? Allergic rhinitis   ? Anal fissure   ? Anginal pain ([place])   ? Asthma   ? Cancer [facility])   ? skin  ? Chest pain   ? CHF (congestive heart failure) ([place])   ? Coronary heart disease   ? s/p stenting. cath in 01/2012 noncritical occlusion  ? Dysrhythmia   ? 1st degree heart block  ? GERD (gastroesophageal reflux disease)   ? Glaucoma   ? Hiatal hernia   ? Hyperlipidemia   ? Hypertension   ? Hypothyroidism   ? Idiopathic thrombocytopenic purpura ([place]) 2002  ? Macular degeneration   ? Nephrolithiasis   ? PUD (peptic ulcer disease)   ? remote  ? Sarcoidosis   ? pulmonary  ? Schatzki's ring   ? ?Past Surgical History:  ?Procedure Laterality Date  ? cardiac stents    ? COLONOSCOPY  10/30/2006  ? Normal rectum, sigmoid diverticula.Remainder of colonic mucosa appeared normal.  ? CORONARY ANGIOPLASTY WITH STENT PLACEMENT    ? about 10 years ago  per pt (around 2007)  ? CYSTOSCOPY WITH RETROGRADE PYELOGRAM, URETEROSCOPY AND STENT PLACEMENT Left 06/16/2017  ? Procedure: CYSTOSCOPY WITH RETROGRADE PYELOGRAM, URETEROSCOPY,STONE EXTRACTION  AND STENT PLACEMENT;  Surgeon: Franchot Gallo, MD;  Location: [facility];  Service: Urology;  Laterality: Left;  ? ESOPHAGOGASTRODUODENOSCOPY  06/19/2004  ? Two esophageal rings and esophageal web as described above.  All of these were disrupted by passing 56-French Venia Minks dilator/ Candida esophagitis,which appears to be incidental given history of   antibiotic use, but nevertheless will be treated.  ? ESOPHAGOGASTRODUODENOSCOPY  10/30/2006  ? Distal tandem esophageal ring status post dilation disruption as  described above.  Otherwise normal esophagus/  Small hiatal hernia otherwise normal stomach, D1 and D2  ? ESOPHAGOGASTRODUODENOSCOPY N/A 03/22/2015  ? Dr.Rourk- noncritical schatzki's ring and hiatal hernia-o/w normal EGD.   ? ESOPHAGOGASTRODUODENOSCOPY (EGD) WITH ESOPHAGEAL DILATION  03/04/2012  ? RMR- schatzki's ring, hiatal hernia, polypoid gastric mucosa, bx= minimally active gastritis.  ? HOLMIUM LASER APPLICATION Left 5/59/7416  ? Procedure: HOLMIUM LASER APPLICATION;  Surgeon: Franchot Gallo, MD;  Location: [facility];  Service: Urology;  Laterality: Left;  ? IR GENERIC HISTORICAL  03/06/2016  ? IR RADIOLOGIST EVAL & MGMT 03/06/2016 Aletta Edouard, MD [facility] INTERV RAD  ? IR GENERIC HISTORICAL  06/18/2016  ? IR RADIOLOGIST EVAL & MGMT 06/18/2016 Aletta Edouard, MD [facility] INTERV RAD  ? IR RADIOLOGIST  EVAL & MGMT  10/01/2016  ? IR RADIOLOGIST EVAL & MGMT  10/15/2017  ? IR RADIOLOGIST EVAL & MGMT  12/24/2018  ? IR RADIOLOGIST EVAL & MGMT  01/04/2020  ? IR RADIOLOGIST EVAL & MGMT  06/20/2021  ? LEFT HEART CATH N/A 02/02/2012  ? Procedure: LEFT HEART CATH;  Surgeon: Lorretta Harp, MD;  Location: [facility] CATH LAB;  Service: Cardiovascular;  Laterality: N/A;  ? MEDIASTINOSCOPY    ? for dx sarcoid  ? RADIOLOGY WITH ANESTHESIA Left  05/17/2016  ? Procedure: left renal ablation;  Surgeon: Aletta Edouard, MD;  Location: [facility];  Service: Radiology;  Laterality: Left;  ? ?Social History:  reports that he quit smoking about 51 years ago. His smoking use included cigarettes and cigars. He has a 0.20 pack-year smoking history. He has never used smokeless tobacco. He reports that he does not drink alcohol and does not use drugs. ? ?Allergies  ?Allergen Reactions  ? Azithromycin Other (See Comments)  ?  Sore mouth and fever blisters around mouth, sores in nose area as well  ? Doxazosin Shortness Of Breath  ? Acetaminophen Other (See Comments)  ?  REACTION: UNKNOWN REACTION  ? Atenolol Other (See Comments)  ?  REACTION: UNKNOWN REACTION  ? Hydrocodone Nausea And Vomiting  ? Hydrocodone-Acetaminophen Nausea Only  ? Levofloxacin Other (See Comments)  ?  Caused stomach problems.  ? Morphine Other (See Comments)  ?  "made me crazy"  ? Penicillins Nausea And Vomiting and Other (See Comments)  ?  Has patient had a PCN reaction causing immediate rash, facial/tongue/throat swelling, SOB or lightheadedness with hypotension: No ?Has patient had a PCN reaction causing severe rash involving mucus membranes or skin necrosis: No ?Has patient had a PCN reaction that required hospitalization No ?Has patient had a PCN reaction occurring within the last 10 years: No ?If all of the above answers are "NO", then may proceed with Cephalosporin use. ?  ? Sulfonamide Derivatives Nausea And Vomiting  ? ? ?Family History  ?Problem Relation Age of Onset  ? Heart disease Father   ?     deceased age 57  ? Stroke Mother   ? Alzheimer's disease Mother   ? Heart attack Brother   ?     deceased age 29  ? Cancer Other   ?     niece  ? Colon cancer Neg Hx   ? ? ?Prior to Admission medications   ?Medication Sig Start Date End Date Taking? Authorizing Provider  ?acyclovir (ZOVIRAX) 400 MG tablet Take 1 tablet (400 mg total) by mouth 2 (two) times daily. 06/25/21   Derek Jack, MD   ?aspirin 81 MG tablet Take 81 mg by mouth every Monday, Wednesday, and Friday.    [provider]  ?atorvastatin (LIPITOR) 40 MG tablet TAKE 1 TABLET BY MOUTH IN  THE EVENING ?Patient taking differently: Take 40 mg by mouth every evening. 10/23/20   Troy Sine, MD  ?atropine 1 % ophthalmic solution  01/10/21   [provider]  ?Bortezomib (VELCADE IJ) Inject as directed. Three weeks on and one weeks off.    [provider]  ?brimonidine (ALPHAGAN) 0.2 % ophthalmic solution Place 1 drop into both eyes 2 (two) times daily.    [provider]  ?dexamethasone (DECADRON) 2 MG tablet TAKE 5 TABLETS(10 MG) BY MOUTH 1 TIME A WEEK 06/25/21   Derek Jack, MD  ?dorzolamide-timolol (COSOPT) 22.3-6.8 MG/ML ophthalmic solution Place 1 drop into both eyes 2 (two)  times daily.    [provider]  ?fluocinonide cream (LIDEX) 8.37 % Apply 1 application. topically 2 (two) times daily. 01/05/21   [provider]  ?furosemide (LASIX) 20 MG tablet If you notice any swelling, you may take 40 mg (two tablets) for the day. ?Patient taking differently: 40 mg daily as needed for fluid. 03/28/21   Troy Sine, MD  ?isosorbide mononitrate (IMDUR) 60 MG 24 hr tablet TAKE 1 AND 1/2 TABLETS BY  MOUTH IN THE MORNING AND  1/2 TABLET AT NIGHT 07/02/21   Troy Sine, MD  ?lenalidomide (REVLIMID) 5 MG capsule Take 1 capsule (5 mg total) by mouth daily. 14 days on, 14 days off 05/22/21   Derek Jack, MD  ?levothyroxine (SYNTHROID) 100 MCG tablet Take 100 mcg by mouth daily before breakfast.    [provider]  ?levothyroxine (SYNTHROID) 125 MCG tablet Take 125 mcg by mouth daily. 03/17/21   [provider]  ?levothyroxine (SYNTHROID) 75 MCG tablet Take 75 mcg by mouth daily. 07/16/21   [provider]  ?losartan (COZAAR) 100 MG tablet TAKE 1 TABLET BY MOUTH  DAILY 01/15/21   Troy Sine, MD  ?metoCLOPramide (REGLAN) 10 MG tablet Take 1 tablet (10  mg total) by mouth every 6 (six) hours as needed for nausea or vomiting. 05/23/21   Evalee Jefferson, PA-C  ?metoprolol tartrate (LOPRESSOR) 25 MG tablet TAKE 1 TABLET BY MOUTH IN  THE MORNING AND ONE-HALF  TABLE

## 2021-07-27 NOTE — ED Notes (Signed)
Notified EDP of pt's BP ?

## 2021-07-27 NOTE — ED Triage Notes (Signed)
Patient complaining of right sided chest pain as well as coughing.  Patient reports he has taken 8-10 nitro since last night at bedtime.   ?

## 2021-07-27 NOTE — ED Notes (Signed)
Pt's spouse left for the evening  ?

## 2021-07-28 DIAGNOSIS — C9000 Multiple myeloma not having achieved remission: Secondary | ICD-10-CM | POA: Diagnosis present

## 2021-07-28 DIAGNOSIS — E43 Unspecified severe protein-calorie malnutrition: Secondary | ICD-10-CM | POA: Diagnosis present

## 2021-07-28 DIAGNOSIS — I10 Essential (primary) hypertension: Secondary | ICD-10-CM | POA: Diagnosis not present

## 2021-07-28 DIAGNOSIS — Z87442 Personal history of urinary calculi: Secondary | ICD-10-CM | POA: Diagnosis not present

## 2021-07-28 DIAGNOSIS — N400 Enlarged prostate without lower urinary tract symptoms: Secondary | ICD-10-CM | POA: Diagnosis present

## 2021-07-28 DIAGNOSIS — R0600 Dyspnea, unspecified: Secondary | ICD-10-CM | POA: Diagnosis not present

## 2021-07-28 DIAGNOSIS — J90 Pleural effusion, not elsewhere classified: Secondary | ICD-10-CM | POA: Diagnosis not present

## 2021-07-28 DIAGNOSIS — R072 Precordial pain: Secondary | ICD-10-CM | POA: Diagnosis not present

## 2021-07-28 DIAGNOSIS — Z8249 Family history of ischemic heart disease and other diseases of the circulatory system: Secondary | ICD-10-CM | POA: Diagnosis not present

## 2021-07-28 DIAGNOSIS — I25119 Atherosclerotic heart disease of native coronary artery with unspecified angina pectoris: Secondary | ICD-10-CM | POA: Diagnosis not present

## 2021-07-28 DIAGNOSIS — D860 Sarcoidosis of lung: Secondary | ICD-10-CM | POA: Diagnosis present

## 2021-07-28 DIAGNOSIS — K219 Gastro-esophageal reflux disease without esophagitis: Secondary | ICD-10-CM | POA: Diagnosis present

## 2021-07-28 DIAGNOSIS — E039 Hypothyroidism, unspecified: Secondary | ICD-10-CM | POA: Diagnosis present

## 2021-07-28 DIAGNOSIS — D61818 Other pancytopenia: Secondary | ICD-10-CM | POA: Diagnosis present

## 2021-07-28 DIAGNOSIS — I25110 Atherosclerotic heart disease of native coronary artery with unstable angina pectoris: Secondary | ICD-10-CM | POA: Diagnosis present

## 2021-07-28 DIAGNOSIS — J180 Bronchopneumonia, unspecified organism: Secondary | ICD-10-CM | POA: Diagnosis present

## 2021-07-28 DIAGNOSIS — Z681 Body mass index (BMI) 19 or less, adult: Secondary | ICD-10-CM | POA: Diagnosis not present

## 2021-07-28 DIAGNOSIS — J189 Pneumonia, unspecified organism: Secondary | ICD-10-CM | POA: Diagnosis present

## 2021-07-28 DIAGNOSIS — R011 Cardiac murmur, unspecified: Secondary | ICD-10-CM | POA: Diagnosis present

## 2021-07-28 DIAGNOSIS — I110 Hypertensive heart disease with heart failure: Secondary | ICD-10-CM | POA: Diagnosis present

## 2021-07-28 DIAGNOSIS — G9341 Metabolic encephalopathy: Secondary | ICD-10-CM | POA: Diagnosis present

## 2021-07-28 DIAGNOSIS — R079 Chest pain, unspecified: Secondary | ICD-10-CM | POA: Diagnosis present

## 2021-07-28 DIAGNOSIS — I5032 Chronic diastolic (congestive) heart failure: Secondary | ICD-10-CM | POA: Diagnosis present

## 2021-07-28 DIAGNOSIS — J45909 Unspecified asthma, uncomplicated: Secondary | ICD-10-CM | POA: Diagnosis present

## 2021-07-28 DIAGNOSIS — D693 Immune thrombocytopenic purpura: Secondary | ICD-10-CM | POA: Diagnosis present

## 2021-07-28 DIAGNOSIS — Z8711 Personal history of peptic ulcer disease: Secondary | ICD-10-CM | POA: Diagnosis not present

## 2021-07-28 DIAGNOSIS — R0789 Other chest pain: Secondary | ICD-10-CM | POA: Diagnosis not present

## 2021-07-28 DIAGNOSIS — E871 Hypo-osmolality and hyponatremia: Secondary | ICD-10-CM | POA: Diagnosis present

## 2021-07-28 DIAGNOSIS — D638 Anemia in other chronic diseases classified elsewhere: Secondary | ICD-10-CM | POA: Diagnosis present

## 2021-07-28 DIAGNOSIS — E785 Hyperlipidemia, unspecified: Secondary | ICD-10-CM | POA: Diagnosis present

## 2021-07-28 DIAGNOSIS — Z87891 Personal history of nicotine dependence: Secondary | ICD-10-CM | POA: Diagnosis not present

## 2021-07-28 DIAGNOSIS — H409 Unspecified glaucoma: Secondary | ICD-10-CM | POA: Diagnosis present

## 2021-07-28 LAB — TROPONIN I (HIGH SENSITIVITY)
Troponin I (High Sensitivity): 51 ng/L — ABNORMAL HIGH (ref <18)
Troponin I (High Sensitivity): 57 ng/L — ABNORMAL HIGH (ref <18)
Troponin I (High Sensitivity): 46 ng/L — ABNORMAL HIGH (ref <18)

## 2021-07-28 LAB — PROCALCITONIN: Procalcitonin: <0.1 ng/mL

## 2021-07-28 MED ORDER — DOXYCYCLINE HYCLATE 100 MG PO TABS
100.0000 mg | ORAL_TABLET | Freq: Two times a day (BID) | ORAL | Status: DC
  Administered 2021-07-28 – 2021-07-30 (×4): 100 mg via ORAL
  Filled 2021-07-28 (×4): qty 1

## 2021-07-28 MED ORDER — CEFTRIAXONE SODIUM 2 G IJ SOLR
2.0000 g | INTRAMUSCULAR | Status: DC
Start: 2021-07-28 — End: 2021-07-30
  Administered 2021-07-28 – 2021-07-29 (×2): 2 g via INTRAVENOUS
  Filled 2021-07-28 (×2): qty 20

## 2021-07-28 MED ORDER — DM-GUAIFENESIN ER 30-600 MG PO TB12
1.0000 | ORAL_TABLET | Freq: Two times a day (BID) | ORAL | Status: DC
Start: 2021-07-28 — End: 2021-07-29
  Administered 2021-07-28 – 2021-07-29 (×2): 1 via ORAL
  Filled 2021-07-28 (×2): qty 1

## 2021-07-28 MED ORDER — ALBUTEROL SULFATE (2.5 MG/3ML) 0.083% IN NEBU
2.5000 mg | INHALATION_SOLUTION | RESPIRATORY_TRACT | Status: DC | PRN
Start: 2021-07-28 — End: 2021-07-30

## 2021-07-28 MED ORDER — IPRATROPIUM-ALBUTEROL 0.5-2.5 (3) MG/3ML IN SOLN
3.0000 mL | Freq: Four times a day (QID) | RESPIRATORY_TRACT | Status: DC
  Administered 2021-07-28 – 2021-07-29 (×2): 3 mL via RESPIRATORY_TRACT
  Filled 2021-07-28 (×2): qty 3

## 2021-07-28 NOTE — Progress Notes (Signed)
Patient clothing at bedside along with glasses and shoes.  Patient has a jacket with black pants, plaid shirt, and light green t-shirt in room.  Patient medic alert bracelet and nitro tablets removed from patient body and placed in patient belonging bag. ?

## 2021-07-28 NOTE — Progress Notes (Signed)
?PROGRESS NOTE ? ? ? ? ?Dale [name], is a 88 y.o. male, DOB - 06-19-1928, WUX:[id number] ? ?Admit date - 07/27/2021   Admitting Physician Katlen Seyer Denton Brick, MD ? ?Outpatient Primary MD for the patient is Redmond School, MD ? ?LOS - 0 ? ?Chief Complaint  ?Patient presents with  ? Chest Pain  ?    ? ? ?Brief Narrative:  ?88 y.o. male with medical history significant of multiple myeloma, ITP, hypertension, coronary artery disease with history of stenting in 2013, hypertension admitted on 07/27/21 with bilateral pneumonia right more than left with associated right-sided chest discomfort ? ?  ?-Assessment and Plan: ? ?1)Bilateral CAP---Rt > Lt  with small Rt pleural effusion ?-Patient with multiple antibiotic allergies ?-Treat empirically with IV Rocephin and doxycycline ?-Mucolytics and bronchodilators as ordered ?-Cough and congestion persist ?-Repeat chest x-ray in couple days sooner if symptoms worsen. ? ?2)Atypical Rt sided CP- Not relieved by nitroglycerin x8 tablets ?-Also  in the setting of significant dense consolidation in the right lower lobe/pneumonia ?-Patient is ruled out for ACS by cardiac enzymes and EKG ?-EKGs show sinus rhythm with nonspecific T wave abnormalities but no significant ST segment changes specially ?- Troponin- 28>>30>>51>>57>.46  ?-Patient with history of CAD with prior angioplasty and stent ?-Continue aspirin and  Lipitor, continue high-dose isosorbide metoprolol ?-Echo from 05/10/2021 with EF of 60 to 65% without wall motion normalities and with normal diastolic parameters at that time ?-That echo also did not show any significant MS or AS ? ?3)Hypothyroidism--continue levothyroxine 75 mcg daily ? ?4)GERD- continue Protonix ? ?5)HTN--stable continue metoprolol, losartan and isosorbide ? ?6) ITP/multiple myeloma/chronic anemia and thrombocytopenia ?-No bleeding concerns, platelets currently around 65 ?-Hgb 8.9 which is higher than baseline ?PTA patient was on Revlimid and steroids ?-Stop  prophylactic Lovenox, use SCDs/teds for DVT prophylaxis instead ? ?Disposition/Need for in-Hospital Stay- patient unable to be discharged at this time due to - ? ?Status is: Inpatient  ? ?Disposition: The patient is from: Home ?             Anticipated d/c is to: Home ?             Anticipated d/c date is: 2 days ?             Patient currently is not medically stable to d/c. ?Barriers: Not Clinically Stable-  ? ?Code Status :  -  Code Status: Full Code  ? ?Family Communication:    NA (patient is alert, awake and coherent)  ? ?DVT Prophylaxis  :   - SCDs   Place TED hose Start: 07/28/21 1734 ?Place and maintain sequential compression device Start: 07/28/21 1733 ? ? ?Lab Results  ?Component Value Date  ? PLT 65 (L) 07/27/2021  ? ? ?Inpatient Medications ? ?Scheduled Meds: ? [START ON 07/30/2021] aspirin  81 mg Oral Q M,W,F  ? atorvastatin  40 mg Oral QPM  ? dextromethorphan-guaiFENesin  1 tablet Oral BID  ? doxycycline  100 mg Oral Q12H  ? ipratropium-albuterol  3 mL Nebulization Q6H  ? isosorbide mononitrate  30 mg Oral QHS  ? isosorbide mononitrate  90 mg Oral Daily  ? levothyroxine  75 mcg Oral Q0600  ? losartan  100 mg Oral Daily  ? metoprolol tartrate  12.5 mg Oral QHS  ? metoprolol tartrate  25 mg Oral Daily  ? pantoprazole  40 mg Oral Daily  ? potassium chloride SA  20 mEq Oral Daily  ? ?Continuous Infusions: ? cefTRIAXone (ROCEPHIN)  IV    ? ?  PRN Meds:.albuterol, ondansetron **OR** ondansetron (ZOFRAN) IV ? ? ?Anti-infectives (From admission, onward)  ? ? Start     Dose/Rate Route Frequency Ordered Stop  ? 07/28/21 2200  doxycycline (VIBRA-TABS) tablet 100 mg       ? 100 mg Oral Every 12 hours 07/28/21 1720    ? 07/28/21 1800  cefTRIAXone (ROCEPHIN) 2 g in sodium chloride 0.9 % 100 mL IVPB       ? 2 g ?200 mL/hr over 30 Minutes Intravenous Every 24 hours 07/28/21 1720    ? 07/27/21 2200  cefdinir (OMNICEF) capsule 300 mg  Status:  Discontinued       ? 300 mg Oral Every 12 hours 07/27/21 2049 07/28/21 1720  ? ?   ? ?  ? ?Subjective: ?Dale [name] today has no fevers, no emesis,  No chest pain,   ?-Cough and congestion persist ?-Dyspnea with minimal activity persist ?-No leg pains or pleuritic symptoms ? ? ?Objective: ?Vitals:  ? 07/27/21 2224 07/28/21 0531 07/28/21 1000 07/28/21 1426  ?BP: (!) 203/79 (!) 181/71 (!) 159/66 124/61  ?Pulse: 86 86 86 84  ?Resp: _0 ?Temp: 99.2 ?F (37.3 ?C) 99.4 ?F (37.4 ?C)  98.4 ?F (36.9 ?C)  ?TempSrc: Oral Oral    ?SpO2: 93% 97%  98%  ?Weight: 55.8 kg     ?Height: _1  (1.727 m)     ? ? ?Intake/Output Summary (Last 24 hours) at 07/28/2021 1733 ?Last data filed at 07/28/2021 1343 ?Gross per 24 hour  ?Intake 240 ml  ?Output 645 ml  ?Net -405 ml  ? ?Filed Weights  ? 07/27/21 1554 07/27/21 2224  ?Weight: 56.7 kg 55.8 kg  ? ? ?Physical Exam ? ?Gen:- Awake Alert, no acute distress diminished breath ?HEENT:- [place].AT, No sclera icterus ?Neck-Supple Neck,No JVD,.  ?Lungs-diminished breath sounds, right more than left  ?CV- S1, S2 normal, regular  ?Abd-  +ve B.Sounds, Abd Soft, No tenderness,    ?Extremity/Skin:-  pedal pulses present  ?Psych-affect is appropriate, oriented x3 ?Neuro-generalized weakness, no new focal deficits, no tremors ? ?Data Reviewed: I have personally reviewed following labs and imaging studies ? ?CBC: ?Recent Labs  ?Lab 07/26/21 ?0840 07/27/21 ?1604  ?WBC 4.9 7.3  ?NEUTROABS 3.0  --   ?HGB 8.2* 8.9*  ?HCT 25.8* 27.8*  ?MCV 108.4* 106.1*  ?PLT 61* 65*  ? ?Basic Metabolic Panel: ?Recent Labs  ?Lab 07/26/21 ?0840 07/27/21 ?1604  ?NA 134* 133*  ?K 3.6 3.7  ?CL 104 103  ?CO2 26 24  ?GLUCOSE 99 115*  ?BUN 21 17  ?CREATININE 0.79 0.81  ?CALCIUM 8.9 9.2  ?MG 1.9  --   ? ?GFR: ?Estimated Creatinine Clearance: 46.9 mL/min (by C-G formula based on SCr of 0.81 mg/dL). ?Liver Function Tests: ?Recent Labs  ?Lab 07/26/21 ?0840  ?AST 14*  ?ALT 19  ?ALKPHOS 63  ?BILITOT 0.7  ?PROT 8.8*  ?ALBUMIN 2.8*  ? ?Cardiac Enzymes: ?No results for input(s): CKTOTAL, CKMB, CKMBINDEX, TROPONINI in the  last 168 hours. ?BNP (last 3 results) ?No results for input(s): PROBNP in the last 8760 hours. ?HbA1C: ?No results for input(s): HGBA1C in the last 72 hours. ?Sepsis Labs: ?_2 (procalcitonin:4,lacticidven:4) ?)No results found for this or any previous visit (from the past 240 hour(s)).  ? ? ?Radiology Studies: ?DG Chest Portable 1 View ? ?Result Date: 07/27/2021 ?CLINICAL DATA:  Right-sided chest pain, cough EXAM: PORTABLE CHEST 1 VIEW COMPARISON:  01/04/2021 FINDINGS: Single frontal view of the chest demonstrates a stable cardiac silhouette.  There is patchy bilateral airspace disease within the perihilar regions, with denser consolidation at the right lung base. Small right pleural effusion. No pneumothorax. No acute bony abnormalities. IMPRESSION: 1. Bilateral airspace disease greatest in the right lower lobe, consistent with multifocal bronchopneumonia. 2. Small right pleural effusion. Electronically Signed   By: Randa Ngo M.D.   On: 07/27/2021 16:25   ? ? ?Scheduled Meds: ? [START ON 07/30/2021] aspirin  81 mg Oral Q M,W,F  ? atorvastatin  40 mg Oral QPM  ? dextromethorphan-guaiFENesin  1 tablet Oral BID  ? doxycycline  100 mg Oral Q12H  ? ipratropium-albuterol  3 mL Nebulization Q6H  ? isosorbide mononitrate  30 mg Oral QHS  ? isosorbide mononitrate  90 mg Oral Daily  ? levothyroxine  75 mcg Oral Q0600  ? losartan  100 mg Oral Daily  ? metoprolol tartrate  12.5 mg Oral QHS  ? metoprolol tartrate  25 mg Oral Daily  ? pantoprazole  40 mg Oral Daily  ? potassium chloride SA  20 mEq Oral Daily  ? ?Continuous Infusions: ? cefTRIAXone (ROCEPHIN)  IV    ? ? ? LOS: 0 days  ? ?Roxan Hockey M.D on 07/28/2021 at 5:33 PM ? ?Go to [email or web address] - for contact info ? ?[facility] - Office  [phone] ? ?If 7PM-7AM, please contact night-coverage [email or web address] ?Password [email or web address] ?07/28/2021, 5:33 PM  ? ? ?

## 2021-07-29 ENCOUNTER — Inpatient Hospital Stay (HOSPITAL_COMMUNITY): Payer: Medicare Other

## 2021-07-29 DIAGNOSIS — R0789 Other chest pain: Secondary | ICD-10-CM

## 2021-07-29 DIAGNOSIS — G9341 Metabolic encephalopathy: Secondary | ICD-10-CM | POA: Diagnosis not present

## 2021-07-29 DIAGNOSIS — D638 Anemia in other chronic diseases classified elsewhere: Secondary | ICD-10-CM

## 2021-07-29 DIAGNOSIS — J189 Pneumonia, unspecified organism: Secondary | ICD-10-CM | POA: Diagnosis not present

## 2021-07-29 DIAGNOSIS — E43 Unspecified severe protein-calorie malnutrition: Secondary | ICD-10-CM

## 2021-07-29 DIAGNOSIS — E871 Hypo-osmolality and hyponatremia: Secondary | ICD-10-CM

## 2021-07-29 DIAGNOSIS — D61818 Other pancytopenia: Secondary | ICD-10-CM

## 2021-07-29 LAB — CBC
HCT: 22.3 % — ABNORMAL LOW (ref 39.0–52.0)
Hemoglobin: 7.2 g/dL — ABNORMAL LOW (ref 13.0–17.0)
MCH: 35.1 pg — ABNORMAL HIGH (ref 26.0–34.0)
MCHC: 32.3 g/dL (ref 30.0–36.0)
MCV: 108.8 fL — ABNORMAL HIGH (ref 80.0–100.0)
Platelets: 51 10*3/uL — ABNORMAL LOW (ref 150–400)
RBC: 2.05 MIL/uL — ABNORMAL LOW (ref 4.22–5.81)
RDW: 22.8 % — ABNORMAL HIGH (ref 11.5–15.5)
WBC: 3.8 10*3/uL — ABNORMAL LOW (ref 4.0–10.5)
nRBC: 0 % (ref 0.0–0.2)

## 2021-07-29 LAB — BRAIN NATRIURETIC PEPTIDE: B Natriuretic Peptide: 252 pg/mL — ABNORMAL HIGH (ref 0.0–100.0)

## 2021-07-29 LAB — BASIC METABOLIC PANEL
Anion gap: 4 — ABNORMAL LOW (ref 5–15)
BUN: 13 mg/dL (ref 8–23)
CO2: 23 mmol/L (ref 22–32)
Calcium: 8.6 mg/dL — ABNORMAL LOW (ref 8.9–10.3)
Chloride: 104 mmol/L (ref 98–111)
Creatinine, Ser: 0.84 mg/dL (ref 0.61–1.24)
GFR, Estimated: >60 mL/min (ref >60)
Glucose, Bld: 112 mg/dL — ABNORMAL HIGH (ref 70–99)
Potassium: 3.7 mmol/L (ref 3.5–5.1)
Sodium: 131 mmol/L — ABNORMAL LOW (ref 135–145)

## 2021-07-29 LAB — SODIUM, URINE, RANDOM: Sodium, Ur: 101 mmol/L

## 2021-07-29 LAB — PREPARE RBC (CROSSMATCH)

## 2021-07-29 LAB — OSMOLALITY, URINE: Osmolality, Ur: 587 mOsm/kg (ref 300–900)

## 2021-07-29 MED ORDER — IPRATROPIUM-ALBUTEROL 0.5-2.5 (3) MG/3ML IN SOLN
3.0000 mL | Freq: Two times a day (BID) | RESPIRATORY_TRACT | Status: DC
  Administered 2021-07-29 – 2021-07-30 (×2): 3 mL via RESPIRATORY_TRACT
  Filled 2021-07-29 (×3): qty 3

## 2021-07-29 MED ORDER — GUAIFENESIN ER 600 MG PO TB12
600.0000 mg | ORAL_TABLET | Freq: Two times a day (BID) | ORAL | Status: DC
  Administered 2021-07-29 – 2021-07-30 (×3): 600 mg via ORAL
  Filled 2021-07-29 (×2): qty 1

## 2021-07-29 MED ORDER — SODIUM CHLORIDE 0.9% IV SOLUTION: Freq: Once | INTRAVENOUS | Status: AC

## 2021-07-29 MED ORDER — ACETAMINOPHEN 325 MG PO TABS
650.0000 mg | ORAL_TABLET | Freq: Four times a day (QID) | ORAL | Status: DC | PRN
  Administered 2021-07-29: 650 mg via ORAL
  Filled 2021-07-29: qty 2

## 2021-07-29 NOTE — Assessment & Plan Note (Addendum)
Manage anemia as above. ?-Discussed with patient's oncologist who recommended transfusing as needed and watching platelet count which could be done from infection no antibiotics. ?

## 2021-07-29 NOTE — Assessment & Plan Note (Addendum)
Supplement protein as able ?

## 2021-07-29 NOTE — Assessment & Plan Note (Addendum)
Low but stable. Discussed with patient's oncologist who recommended watching ? ?

## 2021-07-29 NOTE — Assessment & Plan Note (Addendum)
May be an element of SIADH with inappropriately elevated UNa. This is overall mild and asymptomatic and should be reevaluated in ambulatory setting.  ?

## 2021-07-29 NOTE — Progress Notes (Addendum)
PROGRESS NOTE  Dale [name] WGN:[id number] DOB: Oct 15, 1928   PCP: Elfredia Nevins, MD  Patient is from: Home.  Lives with his wife.  Independent at baseline.  Still drives.  DOA: 07/27/2021 LOS: 1  Chief complaints Chief Complaint  Patient presents with   Chest Pain     Brief Narrative / Interim history: 88 year old M with PMH of MM, ITP, chronic anemia, CAD s/p remote stenting, HTN and BPH presenting with chest pain and admitted for bilateral pneumonia, right> left.  Started on antibiotics.    Subjective: Seen and examined earlier this morning.  No major events overnight of this morning.  Patient's wife at bedside.  She is concerned about his excessive sleepiness.  Wondering if medication could be causing this.  Patient wakes to voice.  Has no complaints.  Per patient's wife, he had an episode of emesis after coughing this morning.  Denies chest pain, shortness of breath, abdominal pain, melena or hematochezia.  Denies UTI symptoms.  Objective: Vitals:   07/28/21 1426 07/28/21 2200 07/29/21 0423 07/29/21 0734  BP: 124/61 (!) 165/63 (!) 148/67   Pulse: 84 78 83   Resp: 20 20 19    Temp: 98.4 F (36.9 C) 98.2 F (36.8 C) 99.5 F (37.5 C)   TempSrc:  Oral Oral   SpO2: 98% 97% 97% 97%  Weight:      Height:        Examination:  GENERAL: Appears frail.  Nontoxic. HEENT: MMM.  Vision and hearing grossly intact.  NECK: Supple.  No apparent JVD.  RESP:  No IWOB.  Fair aeration bilaterally. CVS:  RRR. Heart sounds normal.  ABD/GI/GU: BS+. Abd soft, NTND.  MSK/EXT:  Moves extremities.  Significant muscle mass and subcu fat loss. SKIN: no apparent skin lesion or wound NEURO: Sleepy but wakes to voice.  Oriented to self, person, city, state and months.  Follows commands.  No apparent focal neuro deficit. PSYCH: Calm. Normal affect.   Procedures:  None  Microbiology summarized: None  Assessment and Plan: * Atypical chest pain Likely due to pneumonia.  He had mildly  elevated troponin but pattern not consistent with ACS.  Twelve-lead EKG and recent echocardiogram reassuring.  Chest pain seems to have resolved. -Manage pneumonia as below -Doubt need for further cardiac work-up. -Continue home medications  CAP (community acquired pneumonia) Likely contributing to his atypical chest pain.  Patient and wife not a great historian. -Continue ceftriaxone and doxycycline -SLP eval. Patient and wife not sure about aspiration -Continue mucolytic's and breathing treatments -Encourage incentive spirometry, OOB -PT/OT eval  Acute metabolic encephalopathy Patient's wife concerned about excessive sleepiness in the hospital.  He is well oriented and independent at baseline.  He is a sleepy this morning but wakes to voice.  He is oriented to self, person, place, city, state and month.  Follows commands. -Discontinue Delsym -Reorientation and delirium precautions. -Treat pneumonia as above  Anemia of chronic disease Recent Labs    05/10/21 0938 05/23/21 1025 06/07/21 1424 06/14/21 1116 06/25/21 1338 07/02/21 1011 07/19/21 1331 07/26/21 0840 07/27/21 1604 07/29/21 0615  HGB 7.7* 7.0* 6.1* 7.9* 7.8* 7.3* 6.1* 8.2* 8.9* 7.2*  Patient with history of multiple myeloma and anemia requiring intermittent blood transfusion.  No report of melena or hematochezia. -Transfuse 1 unit.  Discussed with patient's oncologist as well -Goal Hgb> 8.0 given history of CAD -Monitor  Idiopathic thrombocytopenic purpura (HCC) Recent Labs  Lab 07/26/21 0840 07/27/21 1604 07/29/21 0615  PLT 61* 65* 51*  Platelets slightly  down likely from infection or antibiotics Discussed with patient's oncologist who recommended watching   Multiple myeloma [facility]) Outpatient follow-up with oncology  CAD, RCA stent, RCA new DES July 2009, Cath [place] 2011, 02/02/12 Continue home cardiac meds-Imdur, metoprolol, losartan, ASA, Lipitor  Essential hypertension Cardiac meds as  above  Hypothyroidism Continue home Synthroid  Pancytopenia (HCC) Manage anemia as above. -Discussed with patient's oncologist who recommended transfusing as needed and watching platelet count which could be done from infection no antibiotics.  Hyponatremia SIADH?  Losartan could contribute. -Check urine sodium and osmolality -Recheck in the morning  Protein-calorie malnutrition, severe (HCC) Nutrition Status: -Consult dietitian         Body mass index is 18.7 kg/m.         DVT prophylaxis:  Place TED hose Start: 07/28/21 1734 Place and maintain sequential compression device Start: 07/28/21 1733  Code Status: Full code Family Communication: Updated patient's wife at bedside. Level of care: Telemetry Status is: Inpatient Remains inpatient appropriate because: Pneumonia requiring IV antibiotics and pancytopenia requiring blood transfusion   Final disposition: Likely home  Consultants:  None  Sch Meds:  Scheduled Meds:  sodium chloride   Intravenous Once   [START ON 07/30/2021] aspirin  81 mg Oral Q M,W,F   atorvastatin  40 mg Oral QPM   doxycycline  100 mg Oral Q12H   guaiFENesin  600 mg Oral BID   ipratropium-albuterol  3 mL Nebulization BID   isosorbide mononitrate  30 mg Oral QHS   isosorbide mononitrate  90 mg Oral Daily   levothyroxine  75 mcg Oral Q0600   losartan  100 mg Oral Daily   metoprolol tartrate  12.5 mg Oral QHS   metoprolol tartrate  25 mg Oral Daily   pantoprazole  40 mg Oral Daily   potassium chloride SA  20 mEq Oral Daily   Continuous Infusions:  cefTRIAXone (ROCEPHIN)  IV 2 g (07/28/21 1755)   PRN Meds:.albuterol, ondansetron **OR** ondansetron (ZOFRAN) IV  Antimicrobials: Anti-infectives (From admission, onward)    Start     Dose/Rate Route Frequency Ordered Stop   07/28/21 2200  doxycycline (VIBRA-TABS) tablet 100 mg        100 mg Oral Every 12 hours 07/28/21 1720     07/28/21 1800  cefTRIAXone (ROCEPHIN) 2 g in sodium  chloride 0.9 % 100 mL IVPB        2 g 200 mL/hr over 30 Minutes Intravenous Every 24 hours 07/28/21 1720     07/27/21 2200  cefdinir (OMNICEF) capsule 300 mg  Status:  Discontinued        300 mg Oral Every 12 hours 07/27/21 2049 07/28/21 1720        I have personally reviewed the following labs and images: CBC: Recent Labs  Lab 07/26/21 0840 07/27/21 1604 07/29/21 0615  WBC 4.9 7.3 3.8*  NEUTROABS 3.0  --   --   HGB 8.2* 8.9* 7.2*  HCT 25.8* 27.8* 22.3*  MCV 108.4* 106.1* 108.8*  PLT 61* 65* 51*   BMP &GFR Recent Labs  Lab 07/26/21 0840 07/27/21 1604 07/29/21 0615  NA 134* 133* 131*  K 3.6 3.7 3.7  CL 104 103 104  CO2 26 24 23   GLUCOSE 99 115* 112*  BUN 21 17 13   CREATININE 0.79 0.81 0.84  CALCIUM 8.9 9.2 8.6*  MG 1.9  --   --    Estimated Creatinine Clearance: 45.2 mL/min (by C-G formula based on SCr of 0.84 mg/dL). Liver & Pancreas:  Recent Labs  Lab 07/26/21 0840  AST 14*  ALT 19  ALKPHOS 63  BILITOT 0.7  PROT 8.8*  ALBUMIN 2.8*   No results for input(s): LIPASE, AMYLASE in the last 168 hours. No results for input(s): AMMONIA in the last 168 hours. Diabetic: No results for input(s): HGBA1C in the last 72 hours. No results for input(s): GLUCAP in the last 168 hours. Cardiac Enzymes: No results for input(s): CKTOTAL, CKMB, CKMBINDEX, TROPONINI in the last 168 hours. No results for input(s): PROBNP in the last 8760 hours. Coagulation Profile: Recent Labs  Lab 07/27/21 1604  INR 1.4*   Thyroid Function Tests: No results for input(s): TSH, T4TOTAL, FREET4, T3FREE, THYROIDAB in the last 72 hours. Lipid Profile: No results for input(s): CHOL, HDL, LDLCALC, TRIG, CHOLHDL, LDLDIRECT in the last 72 hours. Anemia Panel: No results for input(s): VITAMINB12, FOLATE, FERRITIN, TIBC, IRON, RETICCTPCT in the last 72 hours. Urine analysis:    Component Value Date/Time   COLORURINE STRAW (A) 05/23/2021 1404   APPEARANCEUR CLEAR 05/23/2021 1404    APPEARANCEUR Clear 08/22/2020 1339   LABSPEC >1.030 (H) 05/23/2021 1404   PHURINE 6.0 05/23/2021 1404   GLUCOSEU NEGATIVE 05/23/2021 1404   HGBUR NEGATIVE 05/23/2021 1404   BILIRUBINUR NEGATIVE 05/23/2021 1404   BILIRUBINUR Negative 08/22/2020 1339   KETONESUR NEGATIVE 05/23/2021 1404   PROTEINUR NEGATIVE 05/23/2021 1404   UROBILINOGEN 1.0 02/02/2012 0630   NITRITE NEGATIVE 05/23/2021 1404   LEUKOCYTESUR NEGATIVE 05/23/2021 1404   Sepsis Labs: Invalid input(s): PROCALCITONIN, LACTICIDVEN  Microbiology: No results found for this or any previous visit (from the past 240 hour(s)).  Radiology Studies: DG Chest 2 View  Result Date: 07/29/2021 CLINICAL DATA:  Pneumonia, dyspnea EXAM: CHEST - 2 VIEW COMPARISON:  Chest x-ray 07/27/2021 FINDINGS: Heart size and mediastinum are stable and within normal limits. Pulmonary vasculature is normal. Persistent increased opacities at the right lower lung zone/lung base and small right pleural effusion. No new consolidation identified. No pneumothorax. IMPRESSION: Persistent small right pleural effusion with associated infiltrate/atelectasis. Electronically Signed   By: Jannifer Hick M.D.   On: 07/29/2021 13:36      Joycelin Radloff T. Tibor Lemmons [facility]  If 7PM-7AM, please contact night-coverage [email or web address] 07/29/2021, 2:13 PM

## 2021-07-29 NOTE — Assessment & Plan Note (Addendum)
Continue home cardiac meds-Imdur, metoprolol, losartan, ASA, Lipitor ?

## 2021-07-29 NOTE — Hospital Course (Signed)
88 year old M with PMH of MM, ITP, chronic anemia, CAD s/p remote stenting, HTN and BPH presenting with chest pain and admitted for bilateral pneumonia, right> left.  Started on antibiotics.  ?

## 2021-07-29 NOTE — Assessment & Plan Note (Signed)
-   Continue home Synthroid °

## 2021-07-29 NOTE — Assessment & Plan Note (Addendum)
Likely due to pneumonia.  He had mildly elevated troponin but pattern not consistent with ACS with downward trend, nonexertional, and nonischemic ECG.  ?- Continue pneumonia Tx. If recurs, consider cardiology formal evaluation (consulted here, no further work up recommended by Dr. Marlou Porch) ?

## 2021-07-29 NOTE — Assessment & Plan Note (Addendum)
Patient is alert and oriented on day of discharge.  ?- Avoid sedating medications as much as possible. ?

## 2021-07-29 NOTE — Assessment & Plan Note (Signed)
Cardiac meds as above ?

## 2021-07-29 NOTE — Assessment & Plan Note (Addendum)
Patient with history of multiple myeloma and anemia requiring intermittent blood transfusion. No report of melena or hematochezia. ?- Transfused 1u PRBCs 3/26 with appropriate response above goal of at least 8g/dl. Recheck CBC at follow up. ?

## 2021-07-29 NOTE — Assessment & Plan Note (Addendum)
Outpatient follow-up and Tx with oncology ?

## 2021-07-29 NOTE — Assessment & Plan Note (Addendum)
RLL. Labs, fever curve, and PCT reassuring.  ?- Complete 5 days Tx with clinical stability. Improved on ceftriaxone, doxycycline. Complete 2 more days cefdinir and doxycycline (tolerated both in the hospital despite multiple abx allergies) ?

## 2021-07-30 DIAGNOSIS — I25119 Atherosclerotic heart disease of native coronary artery with unspecified angina pectoris: Secondary | ICD-10-CM | POA: Diagnosis not present

## 2021-07-30 DIAGNOSIS — R0789 Other chest pain: Secondary | ICD-10-CM | POA: Diagnosis not present

## 2021-07-30 DIAGNOSIS — G9341 Metabolic encephalopathy: Secondary | ICD-10-CM | POA: Diagnosis not present

## 2021-07-30 DIAGNOSIS — D638 Anemia in other chronic diseases classified elsewhere: Secondary | ICD-10-CM | POA: Diagnosis not present

## 2021-07-30 LAB — RENAL FUNCTION PANEL
Albumin: 2.2 g/dL — ABNORMAL LOW (ref 3.5–5.0)
Anion gap: 3 — ABNORMAL LOW (ref 5–15)
BUN: 14 mg/dL (ref 8–23)
CO2: 23 mmol/L (ref 22–32)
Calcium: 8.6 mg/dL — ABNORMAL LOW (ref 8.9–10.3)
Chloride: 104 mmol/L (ref 98–111)
Creatinine, Ser: 0.83 mg/dL (ref 0.61–1.24)
GFR, Estimated: >60 mL/min (ref >60)
Glucose, Bld: 90 mg/dL (ref 70–99)
Phosphorus: 2.4 mg/dL — ABNORMAL LOW (ref 2.5–4.6)
Potassium: 4 mmol/L (ref 3.5–5.1)
Sodium: 130 mmol/L — ABNORMAL LOW (ref 135–145)

## 2021-07-30 LAB — TYPE AND SCREEN
ABO/RH(D): O POS
Antibody Screen: NEGATIVE
Unit division: 0

## 2021-07-30 LAB — CBC
HCT: 24.8 % — ABNORMAL LOW (ref 39.0–52.0)
Hemoglobin: 8.1 g/dL — ABNORMAL LOW (ref 13.0–17.0)
MCH: 33.6 pg (ref 26.0–34.0)
MCHC: 32.7 g/dL (ref 30.0–36.0)
MCV: 102.9 fL — ABNORMAL HIGH (ref 80.0–100.0)
Platelets: 50 10*3/uL — ABNORMAL LOW (ref 150–400)
RBC: 2.41 MIL/uL — ABNORMAL LOW (ref 4.22–5.81)
RDW: 23.5 % — ABNORMAL HIGH (ref 11.5–15.5)
WBC: 3.2 10*3/uL — ABNORMAL LOW (ref 4.0–10.5)
nRBC: 0 % (ref 0.0–0.2)

## 2021-07-30 LAB — BPAM RBC
Blood Product Expiration Date: 202304262359
ISSUE DATE / TIME: 202303261616
Unit Type and Rh: 5100

## 2021-07-30 LAB — MAGNESIUM: Magnesium: 1.8 mg/dL (ref 1.7–2.4)

## 2021-07-30 MED ORDER — CEFDINIR 300 MG PO CAPS: 300.0000 mg | ORAL_CAPSULE | Freq: Two times a day (BID) | ORAL | 0 refills | Status: AC

## 2021-07-30 MED ORDER — DOXYCYCLINE HYCLATE 100 MG PO TABS: 100.0000 mg | ORAL_TABLET | Freq: Two times a day (BID) | ORAL | 0 refills | Status: AC

## 2021-07-30 NOTE — Evaluation (Signed)
Physical Therapy Evaluation ?Patient Details ?Name: Dale [name] ?MRN: [id number] ?DOB: [birth year] ?Today's Date: 07/30/2021 ? ?History of Present Illness ? Dale [name] is a 88 y.o. male with medical history significant of multiple myeloma, ITP, hypertension, coronary artery disease with history of stenting in 2013, history of heart failure with normal EF, hypertension.  Patient presents with right-sided severe chest pain that started last night and has been continuous.  No palliating or provoking factors.  Patient took 8 doses of nitroglycerin without improvement.  He came today because the chest pain was still occurring.  At some point in his work-up, his chest pain improved. ?  ?Clinical Impression ? Patient limited for functional mobility as stated below secondary to BLE weakness, fatigue and impaired standing balance. Patient does not require assist for bed mobility. He does demonstrate good sitting balance and sitting tolerance EOB. He transfers to standing without AD requiring assist for BLE weakness and is very unsteady upon standing with wide BOS. Transfer and balance improve with use of RW but he continues to require assist to power up from seated position. He ambulates with RW and intermittent unsteadiness without loss of balance. Patient requires intermittent cueing throughout session for sequencing with good/fair carry over. Patient will benefit from continued physical therapy in hospital and recommended venue below to increase strength, balance, endurance for safe ADLs and gait. ?   ?   ? ?Recommendations for follow up therapy are one component of a multi-disciplinary discharge planning process, led by the attending physician.  Recommendations may be updated based on patient status, additional functional criteria and insurance authorization. ? ?Follow Up Recommendations Home health PT ? ?  ?Assistance Recommended at Discharge Intermittent Supervision/Assistance  ?Patient can return home with the  following ? A little help with walking and/or transfers;A little help with bathing/dressing/bathroom;Assistance with cooking/housework;Assist for transportation;Help with stairs or ramp for entrance ? ?  ?Equipment Recommendations Rolling walker (2 wheels)  ?Recommendations for Other Services ?    ?  ?Functional Status Assessment Patient has had a recent decline in their functional status and demonstrates the ability to make significant improvements in function in a reasonable and predictable amount of time.  ? ?  ?Precautions / Restrictions Precautions ?Precautions: Fall ?Restrictions ?Weight Bearing Restrictions: No  ? ?  ? ?Mobility ? Bed Mobility ?Overal bed mobility: Needs Assistance ?Bed Mobility: Supine to Sit ?  ?  ?Supine to sit: Min guard, HOB elevated ?  ?  ?  ?  ? ?Transfers ?Overall transfer level: Needs assistance ?Equipment used: None, Rolling walker (2 wheels) ?Transfers: Sit to/from Stand, Bed to chair/wheelchair/BSC ?Sit to Stand: Min assist ?  ?Step pivot transfers: Min assist, Min guard ?  ?  ?  ?General transfer comment: transfers to standing without AD initially, very unsteady, wide BOS, reaching for support from PT, bed; transfer and balance improve with use of RW, requires assist to power up ?  ? ?Ambulation/Gait ?Ambulation/Gait assistance: Min guard ?Gait Distance (Feet): 80 Feet ?Assistive device: Rolling walker (2 wheels) ?Gait Pattern/deviations: Trunk flexed, Wide base of support, Step-through pattern, Decreased stride length ?  ?  ?  ?General Gait Details: slightly slow, intermittent unsteadiness with use of RW but without loss of balance ? ?Stairs ?  ?  ?  ?  ?  ? ?Wheelchair Mobility ?  ? ?Modified Rankin (Stroke Patients Only) ?  ? ?  ? ?Balance Overall balance assessment: Needs assistance ?Sitting-balance support: No upper extremity supported, Feet supported ?Sitting balance-Leahy Scale:  Good ?Sitting balance - Comments: seated EOB ?  ?Standing balance support: No upper extremity  supported ?Standing balance-Leahy Scale: Poor ?Standing balance comment: poor without AD, good/fair with RW ?  ?  ?  ?  ?  ?  ?  ?  ?  ?  ?  ?   ? ? ? ?Pertinent Vitals/Pain Pain Assessment ?Pain Assessment: No/denies pain  ? ? ?Home Living Family/patient expects to be discharged to:: Private residence ?Living Arrangements: Spouse/significant other ?Available Help at Discharge: Family ?Type of Home: House ?Home Access: Stairs to enter;Ramped entrance ?  ?Entrance Stairs-Number of Steps: 3 ?  ?Home Layout: One level ?Home Equipment: Conservation officer, nature (2 wheels) ?   ?  ?Prior Function Prior Level of Function : Independent/Modified Independent ?  ?  ?  ?  ?  ?  ?Mobility Comments: Patient states community ambulation without AD ?  ?  ? ? ?Hand Dominance  ?   ? ?  ?Extremity/Trunk Assessment  ? Upper Extremity Assessment ?Upper Extremity Assessment: Defer to OT evaluation ?  ? ?Lower Extremity Assessment ?Lower Extremity Assessment: Generalized weakness ?  ? ?Cervical / Trunk Assessment ?Cervical / Trunk Assessment: Kyphotic  ?Communication  ? Communication: HOH;No difficulties  ?Cognition Arousal/Alertness: Awake/alert ?Behavior During Therapy: [facility] for tasks assessed/performed ?Overall Cognitive Status: Within Functional Limits for tasks assessed ?  ?  ?  ?  ?  ?  ?  ?  ?  ?  ?  ?  ?  ?  ?  ?  ?  ?  ?  ? ?  ?General Comments   ? ?  ?Exercises General Exercises - Lower Extremity ?Hip Flexion/Marching: AROM, Both, 15 reps, Standing  ? ?Assessment/Plan  ?  ?PT Assessment Patient needs continued PT services  ?PT Problem List Decreased strength;Decreased mobility;Decreased activity tolerance;Decreased balance ? ?   ?  ?PT Treatment Interventions DME instruction;Therapeutic exercise;Gait training;Balance training;Stair training;Neuromuscular re-education;Functional mobility training;Therapeutic activities;Patient/family education   ? ?PT Goals (Current goals can be found in the Care Plan section)  ?Acute Rehab PT Goals ?Patient  Stated Goal: Return home ?PT Goal Formulation: With patient ?Time For Goal Achievement: 08/13/21 ?Potential to Achieve Goals: Good ? ?  ?Frequency Min 3X/week ?  ? ? ?Co-evaluation   ?  ?  ?  ?  ? ? ?  ?AM-PAC PT "6 Clicks" Mobility  ?Outcome Measure Help needed turning from your back to your side while in a flat bed without using bedrails?: A Little ?Help needed moving from lying on your back to sitting on the side of a flat bed without using bedrails?: A Little ?Help needed moving to and from a bed to a chair (including a wheelchair)?: A Little ?Help needed standing up from a chair using your arms (e.g., wheelchair or bedside chair)?: A Little ?Help needed to walk in hospital room?: A Little ?Help needed climbing 3-5 steps with a railing? : A Lot ?6 Click Score: 17 ? ?  ?End of Session Equipment Utilized During Treatment: Gait belt ?Activity Tolerance: Patient tolerated treatment well ?Patient left: in chair;with call bell/phone within reach ?Nurse Communication: Mobility status ?PT Visit Diagnosis: Unsteadiness on feet (R26.81);Other abnormalities of gait and mobility (R26.89);Muscle weakness (generalized) (M62.81) ?  ? ?Time: 1610-9604 ?PT Time Calculation (min) (ACUTE ONLY): 25 min ? ? ?Charges:   PT Evaluation ?$PT Eval Low Complexity: 1 Low ?PT Treatments ?$Therapeutic Activity: 8-22 mins ?  ?   ? ?9:59 AM, 07/30/21 ?Mearl Latin PT, DPT ?Physical Therapist  at [facility] ?[facility] ? ? ?

## 2021-07-30 NOTE — Discharge Summary (Signed)
?Physician Discharge Summary ?  ?Patient: Dale [name] MRN: [id number] DOB: [birth year]  ?Admit date:     07/27/2021  ?Discharge date: 07/30/21  ?Discharge Physician: Patrecia Pour  ? ?PCP: Redmond School, MD  ? ?Recommendations at discharge:  ?Complete course of antibiotics for RLL pneumonia, the presumptive cause of chest pain. If chest pain returns, an ischemic cardiac work up could be considered. ?Follow up with Dr. Delton Coombes in the next week with repeat BMP and CBC. Received 1u PRBCs 3/26 as inpatient with hemoglobin at discharge of 8.1g/dl in the absence of bleeding. ? ?Discharge Diagnoses: ?Principal Problem: ?  Atypical chest pain ?Active Problems: ?  CAP (community acquired pneumonia) ?  Idiopathic thrombocytopenic purpura ([place]) ?  Anemia of chronic disease ?  Acute metabolic encephalopathy ?  Hypothyroidism ?  Essential hypertension ?  CAD, RCA stent, RCA new DES July 2009, Cath [place] 2011, 02/02/12 ?  Multiple myeloma ([place]) ?  Unspecified glaucoma ?  Unstable angina, cath showed patent stents 02/02/12 ?  Protein-calorie malnutrition, severe ([place]) ?  Hyponatremia ?  Pancytopenia ([place]) ? ?Resolved Problems: ?  Pneumonia ? ?Hospital Course: ?88 year old M with PMH of MM, ITP, chronic anemia, CAD s/p remote stenting, HTN and BPH presenting with chest pain and admitted for bilateral pneumonia, right> left.  Started on antibiotics.  ? ?Chest pain resolved, and he has remained clinically stable. There was low hemoglobin for which a transfusion of RBCs was given. No active bleeding. Home health, rolling walker, complete 2 more days cefdinir and doxycycline (tolerated both in the hospital despite multiple abx allergies) ordered at discharge.  ? ?Assessment and Plan: ?* Atypical chest pain ?Likely due to pneumonia.  He had mildly elevated troponin but pattern not consistent with ACS with downward trend, nonexertional, and nonischemic ECG.  ?- Continue pneumonia Tx. If recurs, consider cardiology formal evaluation  (consulted here, no further work up recommended by Dr. Marlou Porch) ? ?CAP (community acquired pneumonia) ?RLL. Labs, fever curve, and PCT reassuring.  ?- Complete 5 days Tx with clinical stability. Improved on ceftriaxone, doxycycline. Complete 2 more days cefdinir and doxycycline (tolerated both in the hospital despite multiple abx allergies) ? ?Acute metabolic encephalopathy ?Patient is alert and oriented on day of discharge.  ?- Avoid sedating medications as much as possible. ? ?Anemia of chronic disease ?Patient with history of multiple myeloma and anemia requiring intermittent blood transfusion. No report of melena or hematochezia. ?- Transfused 1u PRBCs 3/26 with appropriate response above goal of at least 8g/dl. Recheck CBC at follow up. ? ?Idiopathic thrombocytopenic purpura ([place]) ?Low but stable. Discussed with patient's oncologist who recommended watching ? ? ?Multiple myeloma ([place]) ?Outpatient follow-up and Tx with oncology ? ?CAD, RCA stent, RCA new DES July 2009, Cath [place] 2011, 02/02/12 ?Continue home cardiac meds-Imdur, metoprolol, losartan, ASA, Lipitor ? ?Essential hypertension ?Cardiac meds as above ? ?Hypothyroidism ?Continue home Synthroid ? ?Pancytopenia ([place]) ?Manage anemia as above. ?-Discussed with patient's oncologist who recommended transfusing as needed and watching platelet count which could be done from infection no antibiotics. ? ?Hyponatremia ?May be an element of SIADH with inappropriately elevated UNa. This is overall mild and asymptomatic and should be reevaluated in ambulatory setting.  ? ?Protein-calorie malnutrition, severe ([place]) ?Supplement protein as able ? ? ?Consultants: Cardiology, Heme/onc Dr. Delton Coombes ?Procedures performed: 1u PRBC 3/26  ?Disposition: Home ?Diet recommendation:  ?Discharge Diet Orders (From admission, onward)  ? ?  Start     Ordered  ? 07/30/21 0000  Diet - low sodium heart  healthy       ? 07/30/21 1207  ? ?  ?  ? ?  ? ?Cardiac diet ?DISCHARGE  MEDICATION: ?Allergies as of 07/30/2021   ? ?   Reactions  ? Azithromycin Other (See Comments)  ? Sore mouth and fever blisters around mouth, sores in nose area as well  ? Doxazosin Shortness Of Breath  ? Acetaminophen Other (See Comments)  ? REACTION: UNKNOWN REACTION  ? Atenolol Other (See Comments)  ? REACTION: UNKNOWN REACTION  ? Hydrocodone Nausea And Vomiting  ? Hydrocodone-acetaminophen Nausea Only  ? Levofloxacin Other (See Comments)  ? Caused stomach problems.  ? Morphine Other (See Comments)  ? "made me crazy"  ? Penicillins Nausea And Vomiting, Other (See Comments)  ? Has patient had a PCN reaction causing immediate rash, facial/tongue/throat swelling, SOB or lightheadedness with hypotension: No ?Has patient had a PCN reaction causing severe rash involving mucus membranes or skin necrosis: No ?Has patient had a PCN reaction that required hospitalization No ?Has patient had a PCN reaction occurring within the last 10 years: No ?If all of the above answers are "NO", then may proceed with Cephalosporin use.  ? Sulfonamide Derivatives Nausea And Vomiting  ? ?  ? ?  ?Medication List  ?  ? ?STOP taking these medications   ? ?lenalidomide 5 MG capsule ?Commonly known as: Revlimid ?  ? ?  ? ?TAKE these medications   ? ?acyclovir 400 MG tablet ?Commonly known as: ZOVIRAX ?Take 1 tablet (400 mg total) by mouth 2 (two) times daily. ?  ?aspirin 81 MG tablet ?Take 81 mg by mouth every Monday, Wednesday, and Friday. ?  ?atorvastatin 40 MG tablet ?Commonly known as: LIPITOR ?TAKE 1 TABLET BY MOUTH IN  THE EVENING ?  ?brimonidine 0.2 % ophthalmic solution ?Commonly known as: ALPHAGAN ?Place 1 drop into both eyes 3 (three) times daily. Noon dosing only in right eye morning and evening in both eyes ?  ?cefdinir 300 MG capsule ?Commonly known as: OMNICEF ?Take 1 capsule (300 mg total) by mouth 2 (two) times daily for 2 days. ?  ?dexamethasone 2 MG tablet ?Commonly known as: DECADRON ?TAKE 5 TABLETS(10 MG) BY MOUTH 1 TIME A  WEEK ?  ?dorzolamide-timolol 22.3-6.8 MG/ML ophthalmic solution ?Commonly known as: COSOPT ?Place 1 drop into both eyes 2 (two) times daily. ?  ?doxycycline 100 MG tablet ?Commonly known as: VIBRA-TABS ?Take 1 tablet (100 mg total) by mouth every 12 (twelve) hours for 2 days. ?  ?furosemide 20 MG tablet ?Commonly known as: LASIX ?If you notice any swelling, you may take 40 mg (two tablets) for the day. ?What changed:  ?how much to take ?how to take this ?when to take this ?additional instructions ?  ?isosorbide mononitrate 60 MG 24 hr tablet ?Commonly known as: IMDUR ?TAKE 1 AND 1/2 TABLETS BY  MOUTH IN THE MORNING AND  1/2 TABLET AT NIGHT ?What changed: additional instructions ?  ?levothyroxine 75 MCG tablet ?Commonly known as: SYNTHROID ?Take 75 mcg by mouth daily. ?What changed: Another medication with the same name was removed. Continue taking this medication, and follow the directions you see here. ?  ?losartan 100 MG tablet ?Commonly known as: COZAAR ?TAKE 1 TABLET BY MOUTH  DAILY ?  ?metoCLOPramide 10 MG tablet ?Commonly known as: REGLAN ?Take 1 tablet (10 mg total) by mouth every 6 (six) hours as needed for nausea or vomiting. ?  ?metoprolol tartrate 25 MG tablet ?Commonly known as: LOPRESSOR ?TAKE 1 TABLET BY MOUTH  IN  THE MORNING AND ONE-HALF  TABLET BY MOUTH IN THE  EVENING ?  ?nitroGLYCERIN 0.4 MG SL tablet ?Commonly known as: NITROSTAT ?Place 1 tablet (0.4 mg total) under the tongue every 5 (five) minutes as needed. For chest pain ?  ?pantoprazole 40 MG tablet ?Commonly known as: PROTONIX ?TAKE 1 TABLET BY MOUTH  DAILY BEFORE BREAKFAST ?  ?potassium chloride SA 20 MEQ tablet ?Commonly known as: KLOR-CON M ?TAKE 1 TABLET BY MOUTH  DAILY ?  ?PreserVision/Lutein Caps ?Take 1 capsule by mouth 2 (two) times daily. ?  ?prochlorperazine 10 MG tablet ?Commonly known as: COMPAZINE ?Take 1 tablet (10 mg total) by mouth every 6 (six) hours as needed for nausea or vomiting. ?  ?Rocklatan 0.02-0.005 % Soln ?Generic  drug: Netarsudil-Latanoprost ?Apply 1 drop to eye at bedtime. ?  ?triamcinolone cream 0.1 % ?Commonly known as: KENALOG ?Apply 1 application. topically 2 (two) times daily as needed (for irritation). ?  ?VELCADE IJ ?Inject a

## 2021-07-30 NOTE — TOC Initial Note (Signed)
Transition of Care (TOC) - Initial/Assessment Note  ? ? ?Patient Details  ?Name: Dale [name] ?MRN: [id number] ?Date of Birth: [birth year] ? ?Transition of Care (TOC) CM/SW Contact:    ?Iona Beard, LCSWA ?Phone Number: ?07/30/2021, 10:17 AM ? ?Clinical Narrative:                 ?Pt is high risk for readmission. CSW spoke with pt to complete assessment. Pt states that he lives with his wife. Pt states that he is independent in completing his ADLs. Pt states that he has someone coming into the home to assist with household chores and assist with his wife. Pt states that he can drive when needed. Pt states that he has not had [place] services in the past. Pt states that his wife uses a walker but he does not use any DME. TOC to follow.  ? ?Expected Discharge Plan: [place] ?Barriers to Discharge: Continued Medical Work up ? ? ?Patient Goals and CMS Choice ?Patient states their goals for this hospitalization and ongoing recovery are:: Return home ?CMS Medicare.gov Compare Post Acute Care list provided to:: Patient ?Choice offered to / list presented to : Patient ? ?Expected Discharge Plan and Services ?Expected Discharge Plan: [place] ?In-house Referral: Clinical Social Work ?  ?  ?Living arrangements for the past 2 months: [place] ?                ?  ?  ?  ?  ?  ?  ?  ?  ?  ?  ? ?Prior Living Arrangements/Services ?Living arrangements for the past 2 months: [place] ?Lives with:: Spouse ?Patient language and need for interpreter reviewed:: Yes ?Do you feel safe going back to the place where you live?: Yes      ?Need for Family Participation in Patient Care: Yes (Comment) ?Care giver support system in place?: Yes (comment) ?  ?Criminal Activity/Legal Involvement Pertinent to Current Situation/Hospitalization: No - Comment as needed ? ?Activities of Daily Living ?Home Assistive Devices/Equipment: None ?ADL Screening (condition at time of admission) ?Patient's  cognitive ability adequate to safely complete daily activities?: Yes ?Is the patient deaf or have difficulty hearing?: No ?Does the patient have difficulty seeing, even when wearing glasses/contacts?: No ?Does the patient have difficulty concentrating, remembering, or making decisions?: No ?Patient able to express need for assistance with ADLs?: Yes ?Does the patient have difficulty dressing or bathing?: No ?Independently performs ADLs?: Yes (appropriate for developmental age) ?Does the patient have difficulty walking or climbing stairs?: No ?Weakness of Legs: None ?Weakness of Arms/Hands: None ? ?Permission Sought/Granted ?  ?  ?   ?   ?   ?   ? ?Emotional Assessment ?Appearance:: Appears stated age ?Attitude/Demeanor/Rapport: Engaged ?Affect (typically observed): Accepting ?Orientation: : Oriented to Self, Oriented to Place, Oriented to Situation, Oriented to  Time ?Alcohol / Substance Use: Not Applicable ?Psych Involvement: No (comment) ? ?Admission diagnosis:  Elevated troponin [R77.8] ?Multiple myeloma not having achieved remission ([place]) [C90.00] ?Chest pain [R07.9] ?Nonspecific chest pain [R07.9] ?Pneumonia [J18.9] ?Patient Active Problem List  ? Diagnosis Date Noted  ? Protein-calorie malnutrition, severe ([place]) 07/29/2021  ? Hyponatremia 07/29/2021  ? Pancytopenia ([place]) 07/29/2021  ? Acute metabolic encephalopathy 05/14/3233  ? CAP (community acquired pneumonia) 07/27/2021  ? Multiple myeloma ([place]) 01/15/2021  ? Plasma cell dyscrasia 12/07/2020  ? Anemia of chronic disease 05/21/2017  ? Weight loss of more than 10% body weight  01/27/2017  ? Sinusitis 01/02/2017  ? Left renal mass 05/17/2016  ? Hiatal hernia   ? Schatzki's ring   ? Odynophagia 03/03/2015  ? Easy bruisability 09/26/2014  ? Hyperlipidemia LDL goal <70 06/28/2014  ? First degree AV block 10/08/2013  ? Atypical chest pain 02/02/2012  ? CAD, RCA stent, RCA new DES July 2009, Cath [facility] 2011, 02/02/12 02/02/2012  ? Unstable angina, cath showed patent  stents 02/02/12 02/02/2012  ? Idiopathic thrombocytopenic purpura ([place]) 02/02/2012  ? BPH (benign prostatic hyperplasia) 02/02/2012  ? Chest pain 08/15/2011  ? Macular degeneration 08/15/2011  ? History of ITP 08/15/2011  ? Abdominal pain 06/28/2010  ? DIARRHEA 06/21/2010  ? GERD 01/19/2009  ? HEMATOCHEZIA 01/19/2009  ? DYSPHAGIA UNSPECIFIED 01/19/2009  ? Hypothyroidism 01/18/2009  ? HYPERCHOLESTEROLEMIA 01/18/2009  ? Unspecified glaucoma 01/18/2009  ? Essential hypertension 01/18/2009  ? CHF 01/18/2009  ? SCHATZKI'S RING 01/18/2009  ? ANAL FISSURE 01/18/2009  ? NEPHROLITHIASIS 01/18/2009  ? PUD, HX OF 01/18/2009  ? Sarcoidosis 08/27/2007  ? Coronary atherosclerosis 08/27/2007  ? Seasonal and perennial allergic rhinitis 08/27/2007  ? Allergic asthma, mild intermittent, uncomplicated 45/91/3685  ? ?PCP:  Redmond School, MD ?Pharmacy:   ?[id number] - [place], [place] - [address] ?[place] [address] ?Phone: [phone] Fax: [phone] ? ?[facility] [phone] - [place], [place] ?[address] ?[place] [address] ?Phone: [phone] Fax: [phone] ? ?[facility] ([facility] ) - [place], [place] ?[address] ?[place] [address] ?Phone: [phone] Fax: [phone] ? ? ? ? ?Social Determinants of Health (SDOH) Interventions ?  ? ?Readmission Risk Interventions ? ?  07/30/2021  ? 10:16 AM  ?Readmission Risk Prevention Plan  ?Transportation Screening Complete  ?[place] or Home Care Consult Complete  ?Social Work Consult for [place] Planning/Counseling Complete  ?Palliative Care Screening Not Applicable  ?Medication Review Press photographer) Complete  ? ? ? ?

## 2021-07-30 NOTE — Evaluation (Signed)
Clinical/Bedside Swallow Evaluation ?Patient Details  ?Name: Dale [name] ?MRN: [id number] ?Date of Birth: 09/14/1928 ? ?Today's Date: 07/30/2021 ?Time: SLP Start Time (ACUTE ONLY): 3825 SLP Stop Time (ACUTE ONLY): 1402 ?SLP Time Calculation (min) (ACUTE ONLY): 27 min ? ?Past Medical History:  ?Past Medical History:  ?Diagnosis Date  ? Allergic rhinitis   ? Anal fissure   ? Anginal pain ([place])   ? Asthma   ? Cancer [facility])   ? skin  ? Chest pain   ? CHF (congestive heart failure) ([place])   ? Coronary heart disease   ? s/p stenting. cath in 01/2012 noncritical occlusion  ? Dysrhythmia   ? 1st degree heart block  ? GERD (gastroesophageal reflux disease)   ? Glaucoma   ? Hiatal hernia   ? Hyperlipidemia   ? Hypertension   ? Hypothyroidism   ? Idiopathic thrombocytopenic purpura ([place]) 2002  ? Macular degeneration   ? Nephrolithiasis   ? PUD (peptic ulcer disease)   ? remote  ? Sarcoidosis   ? pulmonary  ? Schatzki's ring   ? ?Past Surgical History:  ?Past Surgical History:  ?Procedure Laterality Date  ? cardiac stents    ? COLONOSCOPY  10/30/2006  ? Normal rectum, sigmoid diverticula.Remainder of colonic mucosa appeared normal.  ? CORONARY ANGIOPLASTY WITH STENT PLACEMENT    ? about 10 years ago per pt (around 2007)  ? CYSTOSCOPY WITH RETROGRADE PYELOGRAM, URETEROSCOPY AND STENT PLACEMENT Left 06/16/2017  ? Procedure: CYSTOSCOPY WITH RETROGRADE PYELOGRAM, URETEROSCOPY,STONE EXTRACTION  AND STENT PLACEMENT;  Surgeon: Franchot Gallo, MD;  [facility]: [facility];  Service: Urology;  Laterality: Left;  ? ESOPHAGOGASTRODUODENOSCOPY  06/19/2004  ? Two esophageal rings and esophageal web as described above.  All of these were disrupted by passing 56-French Venia Minks dilator/ Candida esophagitis,which appears to be incidental given history of   antibiotic use, but nevertheless will be treated.  ? ESOPHAGOGASTRODUODENOSCOPY  10/30/2006  ? Distal tandem esophageal ring status post dilation disruption as  described above.  Otherwise normal  esophagus/  Small hiatal hernia otherwise normal stomach, D1 and D2  ? ESOPHAGOGASTRODUODENOSCOPY N/A 03/22/2015  ? Dr.Rourk- noncritical schatzki's ring and hiatal hernia-o/w normal EGD.   ? ESOPHAGOGASTRODUODENOSCOPY (EGD) WITH ESOPHAGEAL DILATION  03/04/2012  ? RMR- schatzki's ring, hiatal hernia, polypoid gastric mucosa, bx= minimally active gastritis.  ? HOLMIUM LASER APPLICATION Left 0/53/9767  ? Procedure: HOLMIUM LASER APPLICATION;  Surgeon: Franchot Gallo, MD;  [facility];  Service: Urology;  Laterality: Left;  ? IR GENERIC HISTORICAL  03/06/2016  ? IR RADIOLOGIST EVAL & MGMT 03/06/2016 Aletta Edouard, MD [facility] INTERV RAD  ? IR GENERIC HISTORICAL  06/18/2016  ? IR RADIOLOGIST EVAL & MGMT 06/18/2016 Aletta Edouard, MD [facility] INTERV RAD  ? IR RADIOLOGIST EVAL & MGMT  10/01/2016  ? IR RADIOLOGIST EVAL & MGMT  10/15/2017  ? IR RADIOLOGIST EVAL & MGMT  12/24/2018  ? IR RADIOLOGIST EVAL & MGMT  01/04/2020  ? IR RADIOLOGIST EVAL & MGMT  06/20/2021  ? LEFT HEART CATH N/A 02/02/2012  ? Procedure: LEFT HEART CATH;  Surgeon: Lorretta Harp, MD;  Location: [facility] CATH LAB;  Service: Cardiovascular;  Laterality: N/A;  ? MEDIASTINOSCOPY    ? for dx sarcoid  ? RADIOLOGY WITH ANESTHESIA Left 05/17/2016  ? Procedure: left renal ablation;  Surgeon: Aletta Edouard, MD;  [facility];  Service: Radiology;  Laterality: Left;  ? ?HPI:  ?88 year old M with PMH of MM, ITP, chronic anemia, CAD s/p remote stenting, HTN and BPH  presenting with chest pain and admitted for bilateral pneumonia, right> left.  Started on antibiotics.  ?  ?Assessment / Plan / Recommendation  ?Clinical Impression ? Clinical swallow evaluation completed with Pt sitting up in his chair with the remainder of his lunch meal. Oral motor examination WNL with the exception of edentulous status. Pt self presented cup/straw sips thin liquids without incident. He was noted to have mildly prolonged oral prep with regular texture roast beef, otherwise no issues. He  indicates that he does not eat many meats at home and avoids hard solid foods. Recommend D3/mech soft with thin liquids and po medications whole with water (Pt breaks large pills in half) and no further SLP services at this time. ?SLP Visit Diagnosis: Dysphagia, unspecified (R13.10) ?   ?Aspiration Risk ? No limitations  ?  ?Diet Recommendation Dysphagia 3 (Mech soft);Thin liquid  ? ?Liquid Administration via: Cup;Straw ?Medication Administration: Whole meds with liquid ?Supervision: Patient able to self feed ?Postural Changes: Seated upright at 90 degrees;Remain upright for at least 30 minutes after po intake  ?  ?Other  Recommendations Oral Care Recommendations: Oral care BID ?Other Recommendations: Clarify dietary restrictions   ? ?Recommendations for follow up therapy are one component of a multi-disciplinary discharge planning process, led by the attending physician.  Recommendations may be updated based on patient status, additional functional criteria and insurance authorization. ? ?Follow up Recommendations No SLP follow up  ? ? ?  ?Assistance Recommended at Discharge    ?Functional Status Assessment Patient has not had a recent decline in their functional status  ?Frequency and Duration    ?  ?  ?   ? ?Prognosis Prognosis for Safe Diet Advancement: Good  ? ?  ? ?Swallow Study   ?General Date of Onset: 07/27/21 ?HPI: 88 year old M with PMH of MM, ITP, chronic anemia, CAD s/p remote stenting, HTN and BPH presenting with chest pain and admitted for bilateral pneumonia, right> left.  Started on antibiotics. ?Type of Study: Bedside Swallow Evaluation ?Previous Swallow Assessment: n/a ?Diet Prior to this Study: Regular;Thin liquids ?Temperature Spikes Noted: No ?Respiratory Status: Room air ?History of Recent Intubation: No ?Behavior/Cognition: Alert;Cooperative;Pleasant mood ?Oral Cavity Assessment: Within Functional Limits ?Oral Care Completed by SLP: Yes ?Oral Cavity - Dentition: Edentulous ?Vision: Functional  for self-feeding ?Self-Feeding Abilities: Able to feed self ?Patient Positioning: Upright in chair ?Baseline Vocal Quality: Normal ?Volitional Cough: Strong ?Volitional Swallow: Able to elicit  ?  ?Oral/Motor/Sensory Function Overall Oral Motor/Sensory Function: Within functional limits   ?Ice Chips Ice chips: Within functional limits ?Presentation: Spoon   ?Thin Liquid Thin Liquid: Within functional limits ?Presentation: Cup;Self Fed;Straw  ?  ?Nectar Thick     ?Honey Thick     ?Puree Puree: Within functional limits ?Presentation: Spoon   ?Solid ? ? ?  Solid: Within functional limits ?Presentation: Self Fed;Spoon ?Other Comments: Pt reports some difficulty chewing dry meats and hard solids due to edentulous status  ? ?  ?Thank you, ? ?Genene Churn, [place] ?[phone] ? ?Shawn Dannenberg ?07/30/2021,3:41 PM ? ? ? ? ?

## 2021-07-30 NOTE — Discharge Planning (Signed)
Oncology Discharge Planning Admission Note ? ?[place] at [facility] ?Address: [address] [place], [place] [address] ?Hours of Operation:  8am - 5pm, Monday - Friday  ?Clinic Contact Information:  [phone] ? ?Oncology Care Team: ?Medical Oncologist:  Dr. Derek Jack ? ?Dr. Delton Coombes is aware of this hospital admission dated 07/28/21, and the cancer center will follow [facility] inpatient care to assist with discharge planning as indicated by the oncologist.  We will arrange for necessary follow up prior to discharge. ? ?Disclaimer:  This [place] note does not imply a formal consult request has been made by the admitting attending for this admission or there will be an inpatient consult completed by oncology.  Please request oncology consults as per standard process as indicated. ?

## 2021-07-30 NOTE — Plan of Care (Signed)
?  Problem: Acute Rehab PT Goals(only PT should resolve) ?Goal: Patient Will Transfer Sit To/From Stand ?Outcome: Progressing ?Flowsheets (Taken 07/30/2021 1001) ?Patient will transfer sit to/from stand: ? with modified independence ? with supervision ?Goal: Pt Will Transfer Bed To Chair/Chair To Bed ?Outcome: Progressing ?Flowsheets (Taken 07/30/2021 1001) ?Pt will Transfer Bed to Chair/Chair to Bed: ? with modified independence ? with supervision ?Goal: Pt Will Ambulate ?Outcome: Progressing ?Flowsheets (Taken 07/30/2021 1001) ?Pt will Ambulate: ? > 125 feet ? with modified independence ? with supervision ?Goal: Pt/caregiver will Perform Home Exercise Program ?Outcome: Progressing ?Flowsheets (Taken 07/30/2021 1001) ?Pt/caregiver will Perform Home Exercise Program: ? For increased strengthening ? For improved balance ? Independently ? 10:02 AM, 07/30/21 ?Mearl Latin PT, DPT ?Physical Therapist at [facility] ?[facility] ? ?

## 2021-07-30 NOTE — TOC Transition Note (Signed)
Transition of Care (TOC) - CM/SW Discharge Note ? ? ?Patient Details  ?Name: Dale [name] ?MRN: [id number] ?Date of Birth: 1928/08/17 ? ?Transition of Care (TOC) CM/SW Contact:  ?Salome Arnt, LCSW ?Phone Number: ?07/30/2021, 12:22 PM ? ? ?Clinical Narrative:  Pt d/c today. Agreeable to home health PT/OT and no preference on agency. Pt's wife also agreeable. PT recommending rolling walker. No preference on agency. Home health referred and accepted by [facility]. Caryl Pina with [employer] to provide rolling walker. RN updated. Pt's wife will pick up pt this afternoon.   ? ? ? ?Final next level of care: [place] ?Barriers to Discharge: Barriers Resolved ? ? ?Patient Goals and CMS Choice ?Patient states their goals for this hospitalization and ongoing recovery are:: Return home ?CMS Medicare.gov Compare Post Acute Care list provided to:: Patient ?Choice offered to / list presented to : Patient ? ?Discharge Placement ?  ?           ?  ?  ?Name of family member notified: wife ?Patient and family notified of of transfer: 07/30/21 ? ?Discharge Plan and Services ?In-house Referral: Clinical Social Work ?  ?           ?DME Arranged: Walker rolling ?DME Agency: [facility] ?Date DME Agency Contacted: 07/30/21 ?Time DME Agency Contacted: 1221 ?Representative spoke with at DME Agency: Caryl Pina ?HH Arranged: PT, OT ?[place] Agency: [place] ([place]) ?Date HH Agency Contacted: 07/30/21 ?Time Magnet: 5009 ?Representative spoke with at [place]: Vaughan Basta ? ?Social Determinants of Health (SDOH) Interventions ?  ? ? ?Readmission Risk Interventions ? ?  07/30/2021  ? 10:16 AM  ?Readmission Risk Prevention Plan  ?Transportation Screening Complete  ?[place] or Home Care Consult Complete  ?Social Work Consult for [place] Planning/Counseling Complete  ?Palliative Care Screening Not Applicable  ?Medication Review [employer]) Complete  ? ? ? ? ? ?

## 2021-07-31 ENCOUNTER — Telehealth (HOSPITAL_COMMUNITY): Payer: Self-pay | Admitting: *Deleted

## 2021-07-31 DIAGNOSIS — Z681 Body mass index (BMI) 19 or less, adult: Secondary | ICD-10-CM | POA: Diagnosis not present

## 2021-07-31 DIAGNOSIS — I10 Essential (primary) hypertension: Secondary | ICD-10-CM | POA: Diagnosis not present

## 2021-07-31 DIAGNOSIS — E871 Hypo-osmolality and hyponatremia: Secondary | ICD-10-CM | POA: Diagnosis not present

## 2021-07-31 DIAGNOSIS — E860 Dehydration: Secondary | ICD-10-CM | POA: Diagnosis not present

## 2021-07-31 DIAGNOSIS — D649 Anemia, unspecified: Secondary | ICD-10-CM | POA: Diagnosis not present

## 2021-07-31 DIAGNOSIS — D696 Thrombocytopenia, unspecified: Secondary | ICD-10-CM | POA: Diagnosis not present

## 2021-07-31 NOTE — Telephone Encounter (Signed)
Dale [name] was contacted by telephone to verify understanding of discharge instructions status post their most recent discharge from the hospital on the date:  07/30/21.  Inpatient discharge AVS was re-reviewed with patient, along with cancer center appointments.  Verification of understanding for oncology specific follow-up was validated using the Teach Back method.  Velcade, labs and ov with Dr. Delton Coombes pushed to next week due to pneumonia and current antibiotic treatment. ? ?Transportation to appointments were confirmed for the patient as being self/caregiver. ? ?[name]?s questions were addressed to their satisfaction upon completion of this post discharge follow-up call for outpatient oncology.  ?

## 2021-08-01 DIAGNOSIS — D693 Immune thrombocytopenic purpura: Secondary | ICD-10-CM | POA: Diagnosis not present

## 2021-08-01 DIAGNOSIS — I440 Atrioventricular block, first degree: Secondary | ICD-10-CM | POA: Diagnosis not present

## 2021-08-01 DIAGNOSIS — D630 Anemia in neoplastic disease: Secondary | ICD-10-CM | POA: Diagnosis not present

## 2021-08-01 DIAGNOSIS — Z9181 History of falling: Secondary | ICD-10-CM | POA: Diagnosis not present

## 2021-08-01 DIAGNOSIS — Z7982 Long term (current) use of aspirin: Secondary | ICD-10-CM | POA: Diagnosis not present

## 2021-08-01 DIAGNOSIS — H3530 Unspecified macular degeneration: Secondary | ICD-10-CM | POA: Diagnosis not present

## 2021-08-01 DIAGNOSIS — C9000 Multiple myeloma not having achieved remission: Secondary | ICD-10-CM | POA: Diagnosis not present

## 2021-08-01 DIAGNOSIS — D61818 Other pancytopenia: Secondary | ICD-10-CM | POA: Diagnosis not present

## 2021-08-01 DIAGNOSIS — I110 Hypertensive heart disease with heart failure: Secondary | ICD-10-CM | POA: Diagnosis not present

## 2021-08-01 DIAGNOSIS — Z955 Presence of coronary angioplasty implant and graft: Secondary | ICD-10-CM | POA: Diagnosis not present

## 2021-08-01 DIAGNOSIS — I5030 Unspecified diastolic (congestive) heart failure: Secondary | ICD-10-CM | POA: Diagnosis not present

## 2021-08-01 DIAGNOSIS — E43 Unspecified severe protein-calorie malnutrition: Secondary | ICD-10-CM | POA: Diagnosis not present

## 2021-08-01 DIAGNOSIS — Z8701 Personal history of pneumonia (recurrent): Secondary | ICD-10-CM | POA: Diagnosis not present

## 2021-08-01 DIAGNOSIS — E039 Hypothyroidism, unspecified: Secondary | ICD-10-CM | POA: Diagnosis not present

## 2021-08-01 DIAGNOSIS — D860 Sarcoidosis of lung: Secondary | ICD-10-CM | POA: Diagnosis not present

## 2021-08-01 DIAGNOSIS — I25110 Atherosclerotic heart disease of native coronary artery with unstable angina pectoris: Secondary | ICD-10-CM | POA: Diagnosis not present

## 2021-08-01 DIAGNOSIS — H409 Unspecified glaucoma: Secondary | ICD-10-CM | POA: Diagnosis not present

## 2021-08-01 DIAGNOSIS — K219 Gastro-esophageal reflux disease without esophagitis: Secondary | ICD-10-CM | POA: Diagnosis not present

## 2021-08-01 DIAGNOSIS — E871 Hypo-osmolality and hyponatremia: Secondary | ICD-10-CM | POA: Diagnosis not present

## 2021-08-02 ENCOUNTER — Inpatient Hospital Stay (HOSPITAL_COMMUNITY): Payer: Medicare Other

## 2021-08-03 DIAGNOSIS — I5030 Unspecified diastolic (congestive) heart failure: Secondary | ICD-10-CM | POA: Diagnosis not present

## 2021-08-03 DIAGNOSIS — I25110 Atherosclerotic heart disease of native coronary artery with unstable angina pectoris: Secondary | ICD-10-CM | POA: Diagnosis not present

## 2021-08-03 DIAGNOSIS — C9000 Multiple myeloma not having achieved remission: Secondary | ICD-10-CM | POA: Diagnosis not present

## 2021-08-03 DIAGNOSIS — E782 Mixed hyperlipidemia: Secondary | ICD-10-CM | POA: Diagnosis not present

## 2021-08-03 DIAGNOSIS — D693 Immune thrombocytopenic purpura: Secondary | ICD-10-CM | POA: Diagnosis not present

## 2021-08-03 DIAGNOSIS — I10 Essential (primary) hypertension: Secondary | ICD-10-CM | POA: Diagnosis not present

## 2021-08-03 DIAGNOSIS — D630 Anemia in neoplastic disease: Secondary | ICD-10-CM | POA: Diagnosis not present

## 2021-08-03 DIAGNOSIS — I110 Hypertensive heart disease with heart failure: Secondary | ICD-10-CM | POA: Diagnosis not present

## 2021-08-06 DIAGNOSIS — I5030 Unspecified diastolic (congestive) heart failure: Secondary | ICD-10-CM | POA: Diagnosis not present

## 2021-08-06 DIAGNOSIS — I25110 Atherosclerotic heart disease of native coronary artery with unstable angina pectoris: Secondary | ICD-10-CM | POA: Diagnosis not present

## 2021-08-06 DIAGNOSIS — I110 Hypertensive heart disease with heart failure: Secondary | ICD-10-CM | POA: Diagnosis not present

## 2021-08-06 DIAGNOSIS — D693 Immune thrombocytopenic purpura: Secondary | ICD-10-CM | POA: Diagnosis not present

## 2021-08-06 DIAGNOSIS — D630 Anemia in neoplastic disease: Secondary | ICD-10-CM | POA: Diagnosis not present

## 2021-08-06 DIAGNOSIS — C9000 Multiple myeloma not having achieved remission: Secondary | ICD-10-CM | POA: Diagnosis not present

## 2021-08-07 DIAGNOSIS — D693 Immune thrombocytopenic purpura: Secondary | ICD-10-CM | POA: Diagnosis not present

## 2021-08-07 DIAGNOSIS — I25110 Atherosclerotic heart disease of native coronary artery with unstable angina pectoris: Secondary | ICD-10-CM | POA: Diagnosis not present

## 2021-08-07 DIAGNOSIS — C9000 Multiple myeloma not having achieved remission: Secondary | ICD-10-CM | POA: Diagnosis not present

## 2021-08-07 DIAGNOSIS — I5030 Unspecified diastolic (congestive) heart failure: Secondary | ICD-10-CM | POA: Diagnosis not present

## 2021-08-07 DIAGNOSIS — I110 Hypertensive heart disease with heart failure: Secondary | ICD-10-CM | POA: Diagnosis not present

## 2021-08-07 DIAGNOSIS — D630 Anemia in neoplastic disease: Secondary | ICD-10-CM | POA: Diagnosis not present

## 2021-08-08 DIAGNOSIS — I5030 Unspecified diastolic (congestive) heart failure: Secondary | ICD-10-CM | POA: Diagnosis not present

## 2021-08-08 DIAGNOSIS — C9000 Multiple myeloma not having achieved remission: Secondary | ICD-10-CM | POA: Diagnosis not present

## 2021-08-08 DIAGNOSIS — I25110 Atherosclerotic heart disease of native coronary artery with unstable angina pectoris: Secondary | ICD-10-CM | POA: Diagnosis not present

## 2021-08-08 DIAGNOSIS — D693 Immune thrombocytopenic purpura: Secondary | ICD-10-CM | POA: Diagnosis not present

## 2021-08-08 DIAGNOSIS — I110 Hypertensive heart disease with heart failure: Secondary | ICD-10-CM | POA: Diagnosis not present

## 2021-08-08 DIAGNOSIS — D630 Anemia in neoplastic disease: Secondary | ICD-10-CM | POA: Diagnosis not present

## 2021-08-09 ENCOUNTER — Inpatient Hospital Stay (HOSPITAL_COMMUNITY): Payer: Medicare Other

## 2021-08-09 ENCOUNTER — Inpatient Hospital Stay (HOSPITAL_COMMUNITY): Payer: Medicare Other | Attending: Hematology | Admitting: Hematology

## 2021-08-09 VITALS — BP 128/52 | HR 65 | Temp 97.2°F | Resp 18 | Ht 67.72 in | Wt 126.8 lb

## 2021-08-09 DIAGNOSIS — D630 Anemia in neoplastic disease: Secondary | ICD-10-CM | POA: Insufficient documentation

## 2021-08-09 DIAGNOSIS — C9000 Multiple myeloma not having achieved remission: Secondary | ICD-10-CM

## 2021-08-09 DIAGNOSIS — D649 Anemia, unspecified: Secondary | ICD-10-CM

## 2021-08-09 DIAGNOSIS — Z79899 Other long term (current) drug therapy: Secondary | ICD-10-CM | POA: Diagnosis not present

## 2021-08-09 DIAGNOSIS — Z5112 Encounter for antineoplastic immunotherapy: Secondary | ICD-10-CM | POA: Diagnosis not present

## 2021-08-09 LAB — CBC WITH DIFFERENTIAL/PLATELET
Abs Immature Granulocytes: 0.01 10*3/uL (ref 0.00–0.07)
Basophils Absolute: 0 10*3/uL (ref 0.0–0.1)
Basophils Relative: 0 %
Eosinophils Absolute: 0 10*3/uL (ref 0.0–0.5)
Eosinophils Relative: 1 %
HCT: 25.2 % — ABNORMAL LOW (ref 39.0–52.0)
Hemoglobin: 8.3 g/dL — ABNORMAL LOW (ref 13.0–17.0)
Immature Granulocytes: 0 %
Lymphocytes Relative: 37 %
Lymphs Abs: 1 10*3/uL (ref 0.7–4.0)
MCH: 34.6 pg — ABNORMAL HIGH (ref 26.0–34.0)
MCHC: 32.9 g/dL (ref 30.0–36.0)
MCV: 105 fL — ABNORMAL HIGH (ref 80.0–100.0)
Monocytes Absolute: 0.6 10*3/uL (ref 0.1–1.0)
Monocytes Relative: 22 %
Neutro Abs: 1.1 10*3/uL — ABNORMAL LOW (ref 1.7–7.7)
Neutrophils Relative %: 40 %
Platelets: 88 10*3/uL — ABNORMAL LOW (ref 150–400)
RBC: 2.4 MIL/uL — ABNORMAL LOW (ref 4.22–5.81)
RDW: 21.7 % — ABNORMAL HIGH (ref 11.5–15.5)
WBC: 2.8 10*3/uL — ABNORMAL LOW (ref 4.0–10.5)
nRBC: 0 % (ref 0.0–0.2)

## 2021-08-09 LAB — COMPREHENSIVE METABOLIC PANEL
ALT: 21 U/L (ref 0–44)
AST: 18 U/L (ref 15–41)
Albumin: 2.7 g/dL — ABNORMAL LOW (ref 3.5–5.0)
Alkaline Phosphatase: 53 U/L (ref 38–126)
Anion gap: 5 (ref 5–15)
BUN: 17 mg/dL (ref 8–23)
CO2: 26 mmol/L (ref 22–32)
Calcium: 9.3 mg/dL (ref 8.9–10.3)
Chloride: 104 mmol/L (ref 98–111)
Creatinine, Ser: 0.88 mg/dL (ref 0.61–1.24)
GFR, Estimated: >60 mL/min (ref >60)
Glucose, Bld: 100 mg/dL — ABNORMAL HIGH (ref 70–99)
Potassium: 4.1 mmol/L (ref 3.5–5.1)
Sodium: 135 mmol/L (ref 135–145)
Total Bilirubin: 0.7 mg/dL (ref 0.3–1.2)
Total Protein: 9 g/dL — ABNORMAL HIGH (ref 6.5–8.1)

## 2021-08-09 LAB — SAMPLE TO BLOOD BANK

## 2021-08-09 LAB — MAGNESIUM: Magnesium: 2.1 mg/dL (ref 1.7–2.4)

## 2021-08-09 MED ORDER — BORTEZOMIB CHEMO SQ INJECTION 3.5 MG (2.5MG/ML)
1.3000 mg/m2 | Freq: Once | INTRAMUSCULAR | Status: AC
  Administered 2021-08-09: 2.25 mg via SUBCUTANEOUS
  Filled 2021-08-09: qty 0.9

## 2021-08-09 NOTE — Progress Notes (Signed)
? ?[facility] ?[address] ?[place], [place] [address] ? ? ?CLINIC:  ?Medical Oncology/Hematology ? ?PCP:  ?Redmond School, MD ?[address] / [place] [address] ?[phone] ? ? ?REASON FOR VISIT:  ?Follow-up for multiple myeloma and anemia ? ?PRIOR THERAPY: none ? ?CURRENT THERAPY: VRd weekly q21d ? ?BRIEF ONCOLOGIC HISTORY:  ?Oncology History  ?Multiple myeloma ([place])  ?01/15/2021 Initial Diagnosis  ? Multiple myeloma ([place]) ?  ?06/25/2021 -  Chemotherapy  ? Patient is on Treatment Plan : MYELOMA NON-TRANSPLANT CANDIDATES VRd weekly q21d  ?   ? ? ?CANCER STAGING: ? Cancer Staging  ?No matching staging information was found for the patient. ? ?INTERVAL HISTORY:  ?[name], a 88 y.o. male, returns for routine follow-up and consideration for next cycle of chemotherapy. Dale [name] was last seen on 07/19/2021. ? ?Due for cycle #3 of Velcade today.  ? ?Overall, he tells me he has been feeling pretty well. He denies pains. He reports he had pneumonia 2 weeks ago, and he reports weakness in his legs since this infection. He denies numbness/tingling.  ? ?Overall, he feels ready for next cycle of chemo today.  ? ?REVIEW OF SYSTEMS:  ?Review of Systems  ?Constitutional:  Negative for appetite change and fatigue.  ?Cardiovascular:  Positive for chest pain.  ?Neurological:  Positive for extremity weakness (legs). Negative for numbness.  ?All other systems reviewed and are negative. ? ?PAST MEDICAL/SURGICAL HISTORY:  ?Past Medical History:  ?Diagnosis Date  ? Allergic rhinitis   ? Anal fissure   ? Anginal pain ([place])   ? Asthma   ? Cancer [facility])   ? skin  ? Chest pain   ? CHF (congestive heart failure) ([place])   ? Coronary heart disease   ? s/p stenting. cath in 01/2012 noncritical occlusion  ? Dysrhythmia   ? 1st degree heart block  ? GERD (gastroesophageal reflux disease)   ? Glaucoma   ? Hiatal hernia   ? Hyperlipidemia   ? Hypertension   ? Hypothyroidism   ? Idiopathic thrombocytopenic purpura ([place])  2002  ? Macular degeneration   ? Nephrolithiasis   ? PUD (peptic ulcer disease)   ? remote  ? Sarcoidosis   ? pulmonary  ? Schatzki's ring   ? ?Past Surgical History:  ?Procedure Laterality Date  ? cardiac stents    ? COLONOSCOPY  10/30/2006  ? Normal rectum, sigmoid diverticula.Remainder of colonic mucosa appeared normal.  ? CORONARY ANGIOPLASTY WITH STENT PLACEMENT    ? about 10 years ago per pt (around 2007)  ? CYSTOSCOPY WITH RETROGRADE PYELOGRAM, URETEROSCOPY AND STENT PLACEMENT Left 06/16/2017  ? Procedure: CYSTOSCOPY WITH RETROGRADE PYELOGRAM, URETEROSCOPY,STONE EXTRACTION  AND STENT PLACEMENT;  Surgeon: Franchot Gallo, MD;  [facility];  Service: Urology;  Laterality: Left;  ? ESOPHAGOGASTRODUODENOSCOPY  06/19/2004  ? Two esophageal rings and esophageal web as described above.  All of these were disrupted by passing 56-French Venia Minks dilator/ Candida esophagitis,which appears to be incidental given history of   antibiotic use, but nevertheless will be treated.  ? ESOPHAGOGASTRODUODENOSCOPY  10/30/2006  ? Distal tandem esophageal ring status post dilation disruption as  described above.  Otherwise normal esophagus/  Small hiatal hernia otherwise normal stomach, D1 and D2  ? ESOPHAGOGASTRODUODENOSCOPY N/A 03/22/2015  ? Dr.Rourk- noncritical schatzki's ring and hiatal hernia-o/w normal EGD.   ? ESOPHAGOGASTRODUODENOSCOPY (EGD) WITH ESOPHAGEAL DILATION  03/04/2012  ? RMR- schatzki's ring, hiatal hernia, polypoid gastric mucosa, bx= minimally active gastritis.  ? HOLMIUM LASER  APPLICATION Left 1/47/8295  ? Procedure: HOLMIUM LASER APPLICATION;  Surgeon: Franchot Gallo, MD;  [facility];  Service: Urology;  Laterality: Left;  ? IR GENERIC HISTORICAL  03/06/2016  ? IR RADIOLOGIST EVAL & MGMT 03/06/2016 Aletta Edouard, MD [facility] INTERV RAD  ? IR GENERIC HISTORICAL  06/18/2016  ? IR RADIOLOGIST EVAL & MGMT 06/18/2016 Aletta Edouard, MD [facility] INTERV RAD  ? IR RADIOLOGIST EVAL & MGMT  10/01/2016  ? IR  RADIOLOGIST EVAL & MGMT  10/15/2017  ? IR RADIOLOGIST EVAL & MGMT  12/24/2018  ? IR RADIOLOGIST EVAL & MGMT  01/04/2020  ? IR RADIOLOGIST EVAL & MGMT  06/20/2021  ? LEFT HEART CATH N/A 02/02/2012  ? Procedure: LEFT HEART CATH;  Surgeon: Lorretta Harp, MD;  Location: [facility] C[facility] LAB;  Service: Cardiovascular;  Laterality: N/A;  ? MEDIASTINOSCOPY    ? for dx sarcoid  ? RADIOLOGY WITH ANESTHESIA Left 05/17/2016  ? Procedure: left renal ablation;  Surgeon: Aletta Edouard, MD;  [facility];  Service: Radiology;  Laterality: Left;  ? ? ?SOCIAL HISTORY:  ?Social History  ? ?Socioeconomic History  ? Marital status: Married  ?  Spouse name: Not on file  ? Number of children: 1  ? Years of education: Not on file  ? Highest education level: Not on file  ?Occupational History  ? Occupation: Retired  ?  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  Employer: RETIRED  ?Tobacco Use  ? Smoking status: Former  ?  Packs/day: 0.10  ?  Years: 2.00  ?  Pack years: 0.20  ?  Types: Cigarettes, Cigars  ?  Quit date: 05/06/1970  ?  Years since quitting: 51.2  ? Smokeless tobacco: Never  ?Vaping Use  ? Vaping Use: Never used  ?Substance and Sexual Activity  ? Alcohol use: No  ?  Alcohol/week: 0.0 standard drinks  ? Drug use: No  ? Sexual activity: Never  ?Other Topics Concern  ? Not on file  ?Social History Narrative  ? Not on file  ? ?Social Determinants of Health  ? ?Financial Resource Strain: Not on file  ?Food Insecurity: Not on file  ?Transportation Needs: Not on file  ?Physical Activity: Not on file  ?Stress: Not on file  ?Social Connections: Not on file  ?Intimate Partner Violence: Not on file  ? ? ?FAMILY HISTORY:  ?Family History  ?Problem Relation Age of Onset  ? Heart disease Father   ?     deceased age 12  ? Stroke Mother   ? Alzheimer's disease Mother   ? Heart attack Brother   ?     deceased age 107  ? Cancer Other   ?     niece  ? Colon cancer Neg Hx   ? ? ?CURRENT MEDICATIONS:  ?Current Outpatient Medications  ?Medication Sig Dispense  Refill  ? acyclovir (ZOVIRAX) 400 MG tablet Take 1 tablet (400 mg total) by mouth 2 (two) times daily. 60 tablet 6  ? aspirin 81 MG tablet Take 81 mg by mouth every Monday, Wednesday, and Friday.    ? atorvastatin (LIPITOR) 40 MG tablet TAKE 1 TABLET BY MOUTH IN  THE EVENING (Patient taking differently: Take 40 mg by mouth every evening.) 90 tablet 3  ? Bortezomib (VELCADE IJ) Inject as directed. Three weeks on and one weeks off.    ? brimonidine (ALPHAGAN) 0.2 % ophthalmic solution Place 1 drop into both eyes 3 (three) times daily. Noon dosing only in right eye morning and  evening in both eyes    ? dexamethasone (DECADRON) 2 MG tablet TAKE 5 TABLETS(10 MG) BY MOUTH 1 TIME A WEEK 20 tablet 3  ? dorzolamide-timolol (COSOPT) 22.3-6.8 MG/ML ophthalmic solution Place 1 drop into both eyes 2 (two) times daily.    ? furosemide (LASIX) 20 MG tablet If you notice any swelling, you may take 40 mg (two tablets) for the day. (Patient taking differently: Take 20 mg by mouth daily. If more swelling take an additional 20 mg in the afternoon) 90 tablet 3  ? isosorbide mononitrate (IMDUR) 60 MG 24 hr tablet TAKE 1 AND 1/2 TABLETS BY  MOUTH IN THE MORNING AND  1/2 TABLET AT NIGHT (Patient taking differently: TAKE 60 mg BY  MOUTH IN THE MORNING AND  30 mg TABLET AT NIGHT) 180 tablet 3  ? levothyroxine (SYNTHROID) 75 MCG tablet Take 75 mcg by mouth daily.    ? losartan (COZAAR) 100 MG tablet TAKE 1 TABLET BY MOUTH  DAILY 90 tablet 2  ? metoCLOPramide (REGLAN) 10 MG tablet Take 1 tablet (10 mg total) by mouth every 6 (six) hours as needed for nausea or vomiting. 15 tablet 0  ? metoprolol tartrate (LOPRESSOR) 25 MG tablet TAKE 1 TABLET BY MOUTH IN  THE MORNING AND ONE-HALF  TABLET BY MOUTH IN THE  EVENING 135 tablet 3  ? Multiple Vitamins-Minerals (PRESERVISION/LUTEIN) CAPS Take 1 capsule by mouth 2 (two) times daily.    ? pantoprazole (PROTONIX) 40 MG tablet TAKE 1 TABLET BY MOUTH  DAILY BEFORE BREAKFAST 90 tablet 1  ? potassium  chloride SA (KLOR-CON M) 20 MEQ tablet TAKE 1 TABLET BY MOUTH  DAILY 90 tablet 3  ? ROCKLATAN 0.02-0.005 % SOLN Apply 1 drop to eye at bedtime.    ? triamcinolone (KENALOG) 0.1 % cream Apply 1 application. topic

## 2021-08-09 NOTE — Patient Instructions (Signed)
[facility]  Discharge Instructions: ?Thank you for choosing [facility] to provide your oncology and hematology care.  ?If you have a lab appointment with the [facility], please come in thru the Main Entrance and check in at the main information desk. ? ?Wear comfortable clothing and clothing appropriate for easy access to any Portacath or PICC line.  ? ?We strive to give you quality time with your provider. You may need to reschedule your appointment if you arrive late (15 or more minutes).  Arriving late affects you and other patients whose appointments are after yours.  Also, if you miss three or more appointments without notifying the office, you may be dismissed from the clinic at the provider?s discretion.    ?  ?For prescription refill requests, have your pharmacy contact our office and allow 72 hours for refills to be completed.   ? ?Today you received the following chemotherapy and/or immunotherapy agents Velcade, return as scheduled. ?  ?To help prevent nausea and vomiting after your treatment, we encourage you to take your nausea medication as directed. ? ?BELOW ARE SYMPTOMS THAT SHOULD BE REPORTED IMMEDIATELY: ?*FEVER GREATER THAN 100.4 F (38 ?C) OR HIGHER ?*CHILLS OR SWEATING ?*NAUSEA AND VOMITING THAT IS NOT CONTROLLED WITH YOUR NAUSEA MEDICATION ?*UNUSUAL SHORTNESS OF BREATH ?*UNUSUAL BRUISING OR BLEEDING ?*URINARY PROBLEMS (pain or burning when urinating, or frequent urination) ?*BOWEL PROBLEMS (unusual diarrhea, constipation, pain near the anus) ?TENDERNESS IN MOUTH AND THROAT WITH OR WITHOUT PRESENCE OF ULCERS (sore throat, sores in mouth, or a toothache) ?UNUSUAL RASH, SWELLING OR PAIN  ?UNUSUAL VAGINAL DISCHARGE OR ITCHING  ? ?Items with * indicate a potential emergency and should be followed up as soon as possible or go to the Emergency Department if any problems should occur. ? ?Please show the CHEMOTHERAPY ALERT CARD or IMMUNOTHERAPY ALERT CARD at check-in to the  Emergency Department and triage nurse. ? ?Should you have questions after your visit or need to cancel or reschedule your appointment, please contact [facility] [phone]  and follow the prompts.  Office hours are 8:00 a.m. to 4:30 p.m. Monday - Friday. Please note that voicemails left after 4:00 p.m. may not be returned until the following business day.  We are closed weekends and major holidays. You have access to a nurse at all times for urgent questions. Please call the main number to the clinic [phone] and follow the prompts. ? ?For any non-urgent questions, you may also contact your provider using MyChart. We now offer e-Visits for anyone 60 and older to request care online for non-urgent symptoms. For details visit [email or web address]. ?  ?Also download the MyChart app! Go to the app store, search "MyChart", open the app, select [facility], and log in with your MyChart username and password. ? ?Due to Covid, a mask is required upon entering the hospital/clinic. If you do not have a mask, one will be given to you upon arrival. For doctor visits, patients may have 1 support person aged 39 or older with them. For treatment visits, patients cannot have anyone with them due to current Covid guidelines and our immunocompromised population.  ?

## 2021-08-09 NOTE — Progress Notes (Signed)
Patient has been examined by Dr. Katragadda, and vital signs and labs have been reviewed. ANC, Creatinine, LFTs, hemoglobin, and platelets are within treatment parameters per M.D. - pt may proceed with treatment.    °

## 2021-08-09 NOTE — Patient Instructions (Signed)
[place] at [facility] ?Discharge Instructions ? ? ?You were seen and examined today by Dr. Delton Coombes. ? ?He reviewed the results of your lab work which are normal/stable. ? ?We will proceed with your Velcade injection today at the full dose. ? ?Return as scheduled.  ? ? ?Thank you for choosing [place] at [facility] to provide your oncology and hematology care.  To afford each patient quality time with our provider, please arrive at least 15 minutes before your scheduled appointment time.  ? ?If you have a lab appointment with the [place] please come in thru the Main Entrance and check in at the main information desk. ? ?You need to re-schedule your appointment should you arrive 10 or more minutes late.  We strive to give you quality time with our providers, and arriving late affects you and other patients whose appointments are after yours.  Also, if you no show three or more times for appointments you may be dismissed from the clinic at the providers discretion.     ?Again, thank you for choosing [facility].  Our hope is that these requests will decrease the amount of time that you wait before being seen by our physicians.       ?_____________________________________________________________ ? ?Should you have questions after your visit to [facility], please contact our office at [phone] and follow the prompts.  Our office hours are 8:00 a.m. and 4:30 p.m. Monday - Friday.  Please note that voicemails left after 4:00 p.m. may not be returned until the following business day.  We are closed weekends and major holidays.  You do have access to a nurse 24-7, just call the main number to the clinic [phone] and do not press any options, hold on the line and a nurse will answer the phone.   ? ?For prescription refill requests, have your pharmacy contact our office and allow 72 hours.   ? ?Due to Covid, you will need to wear  a mask upon entering the hospital. If you do not have a mask, a mask will be given to you at the Main Entrance upon arrival. For doctor visits, patients may have 1 support person age 9 or older with them. For treatment visits, patients can not have anyone with them due to social distancing guidelines and our immunocompromised population.  ? ?   ?

## 2021-08-09 NOTE — Progress Notes (Signed)
Patient tolerated Velcade injection with no complaints voiced. Lab work reviewed. See MAR for details. Injection site clean and dry with no bruising or swelling noted. Patient stable during and after injection. Band aid applied. VSS. Patient left in satisfactory condition with no s/s of distress noted. 

## 2021-08-09 NOTE — Progress Notes (Signed)
MD confirmed today patient will receive Velcade at 1.3 mg/m2 (full dose) ? ?T.O. Dr Rhys Martini, PharmD ?

## 2021-08-10 LAB — KAPPA/LAMBDA LIGHT CHAINS
Kappa free light chain: 8.2 mg/L (ref 3.3–19.4)
Kappa, lambda light chain ratio: 0.01 — ABNORMAL LOW (ref 0.26–1.65)
Lambda free light chains: 656.3 mg/L — ABNORMAL HIGH (ref 5.7–26.3)

## 2021-08-11 ENCOUNTER — Encounter (HOSPITAL_COMMUNITY): Payer: Self-pay | Admitting: Hematology

## 2021-08-13 ENCOUNTER — Encounter (HOSPITAL_COMMUNITY): Payer: Self-pay

## 2021-08-13 LAB — PROTEIN ELECTROPHORESIS, SERUM
A/G Ratio: 0.6 — ABNORMAL LOW (ref 0.7–1.7)
Albumin ELP: 3.3 g/dL (ref 2.9–4.4)
Alpha-1-Globulin: 0.3 g/dL (ref 0.0–0.4)
Alpha-2-Globulin: 0.7 g/dL (ref 0.4–1.0)
Beta Globulin: 0.7 g/dL (ref 0.7–1.3)
Gamma Globulin: 3.6 g/dL — ABNORMAL HIGH (ref 0.4–1.8)
Globulin, Total: 5.4 g/dL — ABNORMAL HIGH (ref 2.2–3.9)
M-Spike, %: 3.4 g/dL — ABNORMAL HIGH
Total Protein ELP: 8.7 g/dL — ABNORMAL HIGH (ref 6.0–8.5)

## 2021-08-13 NOTE — Progress Notes (Signed)
Patient's wife called reporting nausea since yesterday and unable to eat. Patient's last episode of vomiting was yesterday. Patient's wife unaware of where patient's compazine is so the patient has not taken any. Instructed wife to take the patient to the ER for further evaluation, to which she states "I tried, but that's not how it works around here." Encouraged wife to utilize compazine as prescribed but to take the patient to the ER if vomiting resumes. She verbalized understanding. ?

## 2021-08-16 ENCOUNTER — Inpatient Hospital Stay (HOSPITAL_COMMUNITY): Payer: Medicare Other

## 2021-08-16 VITALS — BP 135/62 | HR 60 | Temp 97.5°F | Resp 18

## 2021-08-16 DIAGNOSIS — D696 Thrombocytopenia, unspecified: Secondary | ICD-10-CM

## 2021-08-16 DIAGNOSIS — C9000 Multiple myeloma not having achieved remission: Secondary | ICD-10-CM

## 2021-08-16 DIAGNOSIS — Z79899 Other long term (current) drug therapy: Secondary | ICD-10-CM | POA: Diagnosis not present

## 2021-08-16 DIAGNOSIS — Z5112 Encounter for antineoplastic immunotherapy: Secondary | ICD-10-CM | POA: Diagnosis not present

## 2021-08-16 DIAGNOSIS — D630 Anemia in neoplastic disease: Secondary | ICD-10-CM | POA: Diagnosis not present

## 2021-08-16 DIAGNOSIS — D649 Anemia, unspecified: Secondary | ICD-10-CM

## 2021-08-16 LAB — COMPREHENSIVE METABOLIC PANEL
ALT: 16 U/L (ref 0–44)
AST: 17 U/L (ref 15–41)
Albumin: 2.7 g/dL — ABNORMAL LOW (ref 3.5–5.0)
Alkaline Phosphatase: 46 U/L (ref 38–126)
Anion gap: 4 — ABNORMAL LOW (ref 5–15)
BUN: 30 mg/dL — ABNORMAL HIGH (ref 8–23)
CO2: 23 mmol/L (ref 22–32)
Calcium: 8.6 mg/dL — ABNORMAL LOW (ref 8.9–10.3)
Chloride: 106 mmol/L (ref 98–111)
Creatinine, Ser: 1.07 mg/dL (ref 0.61–1.24)
GFR, Estimated: >60 mL/min (ref >60)
Glucose, Bld: 100 mg/dL — ABNORMAL HIGH (ref 70–99)
Potassium: 4.4 mmol/L (ref 3.5–5.1)
Sodium: 133 mmol/L — ABNORMAL LOW (ref 135–145)
Total Bilirubin: 0.2 mg/dL — ABNORMAL LOW (ref 0.3–1.2)
Total Protein: 8.5 g/dL — ABNORMAL HIGH (ref 6.5–8.1)

## 2021-08-16 LAB — CBC WITH DIFFERENTIAL/PLATELET
Basophils Absolute: 0 10*3/uL (ref 0.0–0.1)
Basophils Relative: 0 %
Eosinophils Absolute: 0 10*3/uL (ref 0.0–0.5)
Eosinophils Relative: 1 %
HCT: 21 % — ABNORMAL LOW (ref 39.0–52.0)
Hemoglobin: 6.8 g/dL — CL (ref 13.0–17.0)
Lymphocytes Relative: 54 %
Lymphs Abs: 1.2 10*3/uL (ref 0.7–4.0)
MCH: 34.3 pg — ABNORMAL HIGH (ref 26.0–34.0)
MCHC: 32.4 g/dL (ref 30.0–36.0)
MCV: 106.1 fL — ABNORMAL HIGH (ref 80.0–100.0)
Monocytes Absolute: 0.3 10*3/uL (ref 0.1–1.0)
Monocytes Relative: 14 %
Neutro Abs: 0.7 10*3/uL — ABNORMAL LOW (ref 1.7–7.7)
Neutrophils Relative %: 31 %
Platelets: 48 10*3/uL — ABNORMAL LOW (ref 150–400)
RBC: 1.98 MIL/uL — ABNORMAL LOW (ref 4.22–5.81)
RDW: 22.3 % — ABNORMAL HIGH (ref 11.5–15.5)
WBC: 2.3 10*3/uL — ABNORMAL LOW (ref 4.0–10.5)
nRBC: 0 % (ref 0.0–0.2)

## 2021-08-16 LAB — MAGNESIUM: Magnesium: 2 mg/dL (ref 1.7–2.4)

## 2021-08-16 LAB — PREPARE RBC (CROSSMATCH)

## 2021-08-16 MED ORDER — ACETAMINOPHEN 325 MG PO TABS
650.0000 mg | ORAL_TABLET | Freq: Once | ORAL | Status: AC
  Administered 2021-08-16: 650 mg via ORAL
  Filled 2021-08-16: qty 2

## 2021-08-16 MED ORDER — DIPHENHYDRAMINE HCL 25 MG PO CAPS
25.0000 mg | ORAL_CAPSULE | Freq: Once | ORAL | Status: AC
  Administered 2021-08-16: 25 mg via ORAL
  Filled 2021-08-16: qty 1

## 2021-08-16 MED ORDER — SODIUM CHLORIDE 0.9% IV SOLUTION
250.0000 mL | Freq: Once | INTRAVENOUS | Status: AC
  Administered 2021-08-16: 250 mL via INTRAVENOUS

## 2021-08-16 MED ORDER — BORTEZOMIB CHEMO SQ INJECTION 3.5 MG (2.5MG/ML)
1.3000 mg/m2 | Freq: Once | INTRAMUSCULAR | Status: AC
  Administered 2021-08-16: 2.25 mg via SUBCUTANEOUS
  Filled 2021-08-16: qty 0.9

## 2021-08-16 NOTE — Progress Notes (Signed)
CRITICAL VALUE ALERT ?Critical value received:  hgb 6.8 ?Date of notification:  4-43-23 ?Time of notification: 1219 ?Critical value read back:  Yes.   ?Nurse who received alert:  C. Deandra Goering RN ?MD notified time and response:  1221, will give 2 units of blood per MD orders.   ?

## 2021-08-16 NOTE — Patient Instructions (Addendum)
[place]  Discharge Instructions: ?Thank you for choosing [place] to provide your oncology and hematology care.  ?If you have a lab appointment with the [facility], please come in thru the Main Entrance and check in at the main information desk. ? ?Wear comfortable clothing and clothing appropriate for easy access to any Portacath or PICC line.  ? ?We strive to give you quality time with your provider. You may need to reschedule your appointment if you arrive late (15 or more minutes).  Arriving late affects you and other patients whose appointments are after yours.  Also, if you miss three or more appointments without notifying the office, you may be dismissed from the clinic at the provider?s discretion.    ?  ?For prescription refill requests, have your pharmacy contact our office and allow 72 hours for refills to be completed.   ? ?Today you received the following chemotherapy and/or immunotherapy agents 1UPRBC  and velcade  ?  ?To help prevent nausea and vomiting after your treatment, we encourage you to take your nausea medication as directed. ? ?BELOW ARE SYMPTOMS THAT SHOULD BE REPORTED IMMEDIATELY: ?*FEVER GREATER THAN 100.4 F (38 ?C) OR HIGHER ?*CHILLS OR SWEATING ?*NAUSEA AND VOMITING THAT IS NOT CONTROLLED WITH YOUR NAUSEA MEDICATION ?*UNUSUAL SHORTNESS OF BREATH ?*UNUSUAL BRUISING OR BLEEDING ?*URINARY PROBLEMS (pain or burning when urinating, or frequent urination) ?*BOWEL PROBLEMS (unusual diarrhea, constipation, pain near the anus) ?TENDERNESS IN MOUTH AND THROAT WITH OR WITHOUT PRESENCE OF ULCERS (sore throat, sores in mouth, or a toothache) ?UNUSUAL RASH, SWELLING OR PAIN  ?UNUSUAL VAGINAL DISCHARGE OR ITCHING  ? ?Items with * indicate a potential emergency and should be followed up as soon as possible or go to the Emergency Department if any problems should occur. ? ?Please show the CHEMOTHERAPY ALERT CARD or IMMUNOTHERAPY ALERT CARD at check-in to the  Emergency Department and triage nurse. ? ?Should you have questions after your visit or need to cancel or reschedule your appointment, please contact [facility] [phone]  and follow the prompts.  Office hours are 8:00 a.m. to 4:30 p.m. Monday - Friday. Please note that voicemails left after 4:00 p.m. may not be returned until the following business day.  We are closed weekends and major holidays. You have access to a nurse at all times for urgent questions. Please call the main number to the clinic [phone] and follow the prompts. ? ?For any non-urgent questions, you may also contact your provider using MyChart. We now offer e-Visits for anyone 48 and older to request care online for non-urgent symptoms. For details visit [email or web address]. ?  ?Also download the MyChart app! Go to the app store, search "MyChart", open the app, select [place], and log in with your MyChart username and password. ? ?Due to Covid, a mask is required upon entering the hospital/clinic. If you do not have a mask, one will be given to you upon arrival. For doctor visits, patients may have 1 support person aged 49 or older with them. For treatment visits, patients cannot have anyone with them due to current Covid guidelines and our immunocompromised population.  ?

## 2021-08-16 NOTE — Progress Notes (Signed)
Patient presents today for Velcade injection pre providers order.  Vital signs within parameters for treatment.  Hgb 6.8 and platelets 48, MD notified.  Message received from Dr. Delton Coombes okay to proceed with Velcade and patient will receive 2UPRBC.   ? ?Patient will get 1UPRBC today and 1UPRBC tomorrow.   ? ?Peripheral IV started and blood return noted pre and post infusion. ? ?1UPRBC given today per MD orders.  Velcade administration and transfusion without incident; injection site WNL; see MAR for injection details.  Patient tolerated procedure well and without incident.  No questions or complaints noted at this time. Vital signs stable.  No complaints at this time.  Discharge from clinic ambulatory in stable condition.  Alert and oriented X 3.  Follow up with [facility] as scheduled.  ?

## 2021-08-16 NOTE — Progress Notes (Signed)
Ok to proceed with platelets 48K ? ?T.O. Dr Rhys Martini, PharmD ?

## 2021-08-17 ENCOUNTER — Inpatient Hospital Stay (HOSPITAL_COMMUNITY): Payer: Medicare Other

## 2021-08-20 ENCOUNTER — Encounter: Payer: Self-pay | Admitting: Internal Medicine

## 2021-08-20 ENCOUNTER — Inpatient Hospital Stay (HOSPITAL_COMMUNITY): Payer: Medicare Other

## 2021-08-20 DIAGNOSIS — D649 Anemia, unspecified: Secondary | ICD-10-CM

## 2021-08-20 DIAGNOSIS — Z5112 Encounter for antineoplastic immunotherapy: Secondary | ICD-10-CM | POA: Diagnosis not present

## 2021-08-20 DIAGNOSIS — D630 Anemia in neoplastic disease: Secondary | ICD-10-CM | POA: Diagnosis not present

## 2021-08-20 DIAGNOSIS — C9000 Multiple myeloma not having achieved remission: Secondary | ICD-10-CM

## 2021-08-20 DIAGNOSIS — Z79899 Other long term (current) drug therapy: Secondary | ICD-10-CM | POA: Diagnosis not present

## 2021-08-20 DIAGNOSIS — D696 Thrombocytopenia, unspecified: Secondary | ICD-10-CM

## 2021-08-20 LAB — TYPE AND SCREEN
ABO/RH(D): O POS
Antibody Screen: NEGATIVE
Unit division: 0
Unit division: 0

## 2021-08-20 LAB — BPAM RBC
Blood Product Expiration Date: 202305182359
Blood Product Expiration Date: 202305182359
ISSUE DATE / TIME: 202304131327
Unit Type and Rh: 5100
Unit Type and Rh: 5100

## 2021-08-20 LAB — CBC WITH DIFFERENTIAL/PLATELET
Basophils Absolute: 0 10*3/uL (ref 0.0–0.1)
Basophils Relative: 0 %
Eosinophils Absolute: 0 10*3/uL (ref 0.0–0.5)
Eosinophils Relative: 0 %
HCT: 24.5 % — ABNORMAL LOW (ref 39.0–52.0)
Hemoglobin: 8.2 g/dL — ABNORMAL LOW (ref 13.0–17.0)
Lymphocytes Relative: 21 %
Lymphs Abs: 0.9 10*3/uL (ref 0.7–4.0)
MCH: 34 pg (ref 26.0–34.0)
MCHC: 33.5 g/dL (ref 30.0–36.0)
MCV: 101.7 fL — ABNORMAL HIGH (ref 80.0–100.0)
Monocytes Absolute: 0.8 10*3/uL (ref 0.1–1.0)
Monocytes Relative: 18 %
Neutro Abs: 2.7 10*3/uL (ref 1.7–7.7)
Neutrophils Relative %: 61 %
Platelets: 31 10*3/uL — ABNORMAL LOW (ref 150–400)
RBC: 2.41 MIL/uL — ABNORMAL LOW (ref 4.22–5.81)
RDW: 20.2 % — ABNORMAL HIGH (ref 11.5–15.5)
WBC: 4.5 10*3/uL (ref 4.0–10.5)
nRBC: 0 % (ref 0.0–0.2)

## 2021-08-20 LAB — COMPREHENSIVE METABOLIC PANEL
ALT: 16 U/L (ref 0–44)
AST: 19 U/L (ref 15–41)
Albumin: 2.8 g/dL — ABNORMAL LOW (ref 3.5–5.0)
Alkaline Phosphatase: 50 U/L (ref 38–126)
Anion gap: 4 — ABNORMAL LOW (ref 5–15)
BUN: 30 mg/dL — ABNORMAL HIGH (ref 8–23)
CO2: 23 mmol/L (ref 22–32)
Calcium: 9.5 mg/dL (ref 8.9–10.3)
Chloride: 105 mmol/L (ref 98–111)
Creatinine, Ser: 1.04 mg/dL (ref 0.61–1.24)
GFR, Estimated: >60 mL/min (ref >60)
Glucose, Bld: 107 mg/dL — ABNORMAL HIGH (ref 70–99)
Potassium: 3.7 mmol/L (ref 3.5–5.1)
Sodium: 132 mmol/L — ABNORMAL LOW (ref 135–145)
Total Bilirubin: 0.8 mg/dL (ref 0.3–1.2)
Total Protein: 9 g/dL — ABNORMAL HIGH (ref 6.5–8.1)

## 2021-08-20 LAB — SAMPLE TO BLOOD BANK

## 2021-08-20 LAB — MAGNESIUM: Magnesium: 1.9 mg/dL (ref 1.7–2.4)

## 2021-08-20 NOTE — Progress Notes (Signed)
Patient presents today for possible blood products per providers order.  Vital signs WNL.  Labs pending.  Patient states that he was up all night Thursday night with diarrhea but has no had any since.  Patient also states that he has no seen any blood in his stool. ? ?Patient states that he feels fine.  Hgb noted to be 8.2, no transfusion needed per parameters. ? ?Patient will have labs rechecked on 08/23/21. ? ?Discharge from clinic ambulatory in stable condition.  Alert and oriented X 3.  Follow up with [facility] as scheduled.  ?

## 2021-08-20 NOTE — Patient Instructions (Signed)
[place]  Discharge Instructions: ?Thank you for choosing [place] to provide your oncology and hematology care.  ?If you have a lab appointment with the [facility], please come in thru the Main Entrance and check in at the main information desk. ? ?Wear comfortable clothing and clothing appropriate for easy access to any Portacath or PICC line.  ? ?We strive to give you quality time with your provider. You may need to reschedule your appointment if you arrive late (15 or more minutes).  Arriving late affects you and other patients whose appointments are after yours.  Also, if you miss three or more appointments without notifying the office, you may be dismissed from the clinic at the provider?s discretion.    ?  ?For prescription refill requests, have your pharmacy contact our office and allow 72 hours for refills to be completed.   ? ?Today you received the following chemotherapy and/or immunotherapy agents No transfusion per parameters    ?  ?To help prevent nausea and vomiting after your treatment, we encourage you to take your nausea medication as directed. ? ?BELOW ARE SYMPTOMS THAT SHOULD BE REPORTED IMMEDIATELY: ?*FEVER GREATER THAN 100.4 F (38 ?C) OR HIGHER ?*CHILLS OR SWEATING ?*NAUSEA AND VOMITING THAT IS NOT CONTROLLED WITH YOUR NAUSEA MEDICATION ?*UNUSUAL SHORTNESS OF BREATH ?*UNUSUAL BRUISING OR BLEEDING ?*URINARY PROBLEMS (pain or burning when urinating, or frequent urination) ?*BOWEL PROBLEMS (unusual diarrhea, constipation, pain near the anus) ?TENDERNESS IN MOUTH AND THROAT WITH OR WITHOUT PRESENCE OF ULCERS (sore throat, sores in mouth, or a toothache) ?UNUSUAL RASH, SWELLING OR PAIN  ?UNUSUAL VAGINAL DISCHARGE OR ITCHING  ? ?Items with * indicate a potential emergency and should be followed up as soon as possible or go to the Emergency Department if any problems should occur. ? ?Please show the CHEMOTHERAPY ALERT CARD or IMMUNOTHERAPY ALERT CARD at check-in to  the Emergency Department and triage nurse. ? ?Should you have questions after your visit or need to cancel or reschedule your appointment, please contact [facility] [phone]  and follow the prompts.  Office hours are 8:00 a.m. to 4:30 p.m. Monday - Friday. Please note that voicemails left after 4:00 p.m. may not be returned until the following business day.  We are closed weekends and major holidays. You have access to a nurse at all times for urgent questions. Please call the main number to the clinic [phone] and follow the prompts. ? ?For any non-urgent questions, you may also contact your provider using MyChart. We now offer e-Visits for anyone 68 and older to request care online for non-urgent symptoms. For details visit [email or web address]. ?  ?Also download the MyChart app! Go to the app store, search "MyChart", open the app, select [place], and log in with your MyChart username and password. ? ?Due to Covid, a mask is required upon entering the hospital/clinic. If you do not have a mask, one will be given to you upon arrival. For doctor visits, patients may have 1 support person aged 23 or older with them. For treatment visits, patients cannot have anyone with them due to current Covid guidelines and our immunocompromised population.  ?

## 2021-08-21 ENCOUNTER — Ambulatory Visit (INDEPENDENT_AMBULATORY_CARE_PROVIDER_SITE_OTHER): Payer: Medicare Other | Admitting: Urology

## 2021-08-21 ENCOUNTER — Encounter: Payer: Self-pay | Admitting: Urology

## 2021-08-21 VITALS — BP 122/48 | HR 62

## 2021-08-21 DIAGNOSIS — C9000 Multiple myeloma not having achieved remission: Secondary | ICD-10-CM | POA: Diagnosis not present

## 2021-08-21 DIAGNOSIS — R351 Nocturia: Secondary | ICD-10-CM

## 2021-08-21 DIAGNOSIS — N401 Enlarged prostate with lower urinary tract symptoms: Secondary | ICD-10-CM

## 2021-08-21 DIAGNOSIS — I25119 Atherosclerotic heart disease of native coronary artery with unspecified angina pectoris: Secondary | ICD-10-CM

## 2021-08-21 DIAGNOSIS — Z87442 Personal history of urinary calculi: Secondary | ICD-10-CM

## 2021-08-21 DIAGNOSIS — I110 Hypertensive heart disease with heart failure: Secondary | ICD-10-CM | POA: Diagnosis not present

## 2021-08-21 DIAGNOSIS — I5030 Unspecified diastolic (congestive) heart failure: Secondary | ICD-10-CM | POA: Diagnosis not present

## 2021-08-21 DIAGNOSIS — Z85528 Personal history of other malignant neoplasm of kidney: Secondary | ICD-10-CM

## 2021-08-21 DIAGNOSIS — C642 Malignant neoplasm of left kidney, except renal pelvis: Secondary | ICD-10-CM | POA: Diagnosis not present

## 2021-08-21 DIAGNOSIS — I25110 Atherosclerotic heart disease of native coronary artery with unstable angina pectoris: Secondary | ICD-10-CM | POA: Diagnosis not present

## 2021-08-21 LAB — URINALYSIS, ROUTINE W REFLEX MICROSCOPIC
Bilirubin, UA: NEGATIVE
Glucose, UA: NEGATIVE
Ketones, UA: NEGATIVE
Leukocytes,UA: NEGATIVE
Nitrite, UA: NEGATIVE
Protein,UA: NEGATIVE
RBC, UA: NEGATIVE
Specific Gravity, UA: 1.02 (ref 1.005–1.030)
Urobilinogen, Ur: 0.2 mg/dL (ref 0.2–1.0)
pH, UA: 5.5 (ref 5.0–7.5)

## 2021-08-21 NOTE — Progress Notes (Signed)
? ?History of Present Illness:  ? ?This man presents for follow-up of multiple issues. ?  ?10.19.2021: This man comes in today for follow-up of BPH.  At his last visit a month ago he was placed on alfuzosin due to feeling of incomplete emptying.  Unfortunately, the day later, he presented to the emergency room with syncopal episodes.  He has been off of alfuzosin since that time.  He still complains of lack of proper emptying but overall his stream is good, he only has nocturia x1.  No recent gross hematuria or dysuria. ?  ?4.19.2022: He has had stable urinary symptomatology.  He is not on any medical therapy at the present time but feels comfortable with the way his urination has been going. ?  ?Urolithiasis ? ?2.11.2019: He underwent cystoscopy, left retrograde ureteral pyelogram, left ureteroscopic stone extraction, stent placement. Urothelial appearance of the left kidney and ureter were normal.  ? ?4.16.2019: No gross hematuria or flank pain since his last visit. Recent ultrasound revealed moderate left hydronephrosis.  ?  ?4.19.2022: He has had no flank pain or gross hematuria. ? ?  Renal cell carcinoma ? ?1.2017--saw Dr. Tresa Moore for follow-up of an enhancing 14 mm left renal mass. This was originally identified in December 2015 on a CT performed for hematuria. At that time, it was measured at 10 mm, although repeat measurement at the time of the second/follow-up CT revealed it was 12 mm. It was recommended that the patient proceed with surveillance of this lesion, as he was 81 and had comorbidities.  ? ?Followup CT scan revealed the lesion to be about 19 mm in size.  ?Because his lesion was enlarging, he was referred for possible cryoablation. This was performed  ?  ?1.12.2018: Cryoablation by Dr. Aletta Edouard. Pathology revealed clear cell adenocarcinoma, WHO grade 2.  ?  ?4.19.202:  Most recent CT 8.2021--1. The cryo ablation zone defect within the anterolateral cortex of ?the mid to lower pole of left  kidney. Continues to decrease in ?overall size. A small focal area of arterial phase enhancement along ?the inferior margin of the ablation zone measures 3 mm. This is new ?when compared with 12/23/2018. Suggest more frequent interval ?surveillance to assess for any temporal change in the appearance of ?this new finding. ?2. Aortic atherosclerosis. ?  ?4.18.2023: Recent CT scan performed in early February- ?1. Redemonstrated post procedural findings of percutaneous ablation ?of the peripheral inferior pole of the left kidney. An arterially ?hyperenhancing nodule at the anterior aspect of the ablation site ?has increased in size, measuring 0.7 x 0.7 cm, previously 0.3 cm, ?consistent with locally recurrent malignancy. ?2. No evidence of renal vein invasion, lymphadenopathy or metastatic ?disease in the abdomen or pelvis at this time. ?3. Nonobstructive bilateral nephrolithiasis. ?4. Cholelithiasis. ?5. Coronary artery disease. ? ? ?He is being treated for multiple myeloma. ? ?Past Medical History:  ?Diagnosis Date  ? Allergic rhinitis   ? Anal fissure   ? Anginal pain ([place])   ? Asthma   ? Cancer [facility])   ? skin  ? Chest pain   ? CHF (congestive heart failure) ([place])   ? Coronary heart disease   ? s/p stenting. cath in 01/2012 noncritical occlusion  ? Dysrhythmia   ? 1st degree heart block  ? GERD (gastroesophageal reflux disease)   ? Glaucoma   ? Hiatal hernia   ? Hyperlipidemia   ? Hypertension   ? Hypothyroidism   ? Idiopathic thrombocytopenic purpura ([place]) 2002  ? Macular degeneration   ?  Nephrolithiasis   ? PUD (peptic ulcer disease)   ? remote  ? Sarcoidosis   ? pulmonary  ? Schatzki's ring   ? ? ?Past Surgical History:  ?Procedure Laterality Date  ? cardiac stents    ? COLONOSCOPY  10/30/2006  ? Normal rectum, sigmoid diverticula.Remainder of colonic mucosa appeared normal.  ? CORONARY ANGIOPLASTY WITH STENT PLACEMENT    ? about 10 years ago per pt (around 2007)  ? CYSTOSCOPY WITH RETROGRADE PYELOGRAM,  URETEROSCOPY AND STENT PLACEMENT Left 06/16/2017  ? Procedure: CYSTOSCOPY WITH RETROGRADE PYELOGRAM, URETEROSCOPY,STONE EXTRACTION  AND STENT PLACEMENT;  Surgeon: Franchot Gallo, MD;  Location: [facility];  Service: Urology;  Laterality: Left;  ? ESOPHAGOGASTRODUODENOSCOPY  06/19/2004  ? Two esophageal rings and esophageal web as described above.  All of these were disrupted by passing 56-French Venia Minks dilator/ Candida esophagitis,which appears to be incidental given history of   antibiotic use, but nevertheless will be treated.  ? ESOPHAGOGASTRODUODENOSCOPY  10/30/2006  ? Distal tandem esophageal ring status post dilation disruption as  described above.  Otherwise normal esophagus/  Small hiatal hernia otherwise normal stomach, D1 and D2  ? ESOPHAGOGASTRODUODENOSCOPY N/A 03/22/2015  ? Dr.Rourk- noncritical schatzki's ring and hiatal hernia-o/w normal EGD.   ? ESOPHAGOGASTRODUODENOSCOPY (EGD) WITH ESOPHAGEAL DILATION  03/04/2012  ? RMR- schatzki's ring, hiatal hernia, polypoid gastric mucosa, bx= minimally active gastritis.  ? HOLMIUM LASER APPLICATION Left 10/31/3149  ? Procedure: HOLMIUM LASER APPLICATION;  Surgeon: Franchot Gallo, MD;  Location: [facility];  Service: Urology;  Laterality: Left;  ? IR GENERIC HISTORICAL  03/06/2016  ? IR RADIOLOGIST EVAL & MGMT 03/06/2016 Aletta Edouard, MD [facility] INTERV RAD  ? IR GENERIC HISTORICAL  06/18/2016  ? IR RADIOLOGIST EVAL & MGMT 06/18/2016 Aletta Edouard, MD [facility] INTERV RAD  ? IR RADIOLOGIST EVAL & MGMT  10/01/2016  ? IR RADIOLOGIST EVAL & MGMT  10/15/2017  ? IR RADIOLOGIST EVAL & MGMT  12/24/2018  ? IR RADIOLOGIST EVAL & MGMT  01/04/2020  ? IR RADIOLOGIST EVAL & MGMT  06/20/2021  ? LEFT HEART CATH N/A 02/02/2012  ? Procedure: LEFT HEART CATH;  Surgeon: Lorretta Harp, MD;  Location: [facility] CATH LAB;  Service: Cardiovascular;  Laterality: N/A;  ? MEDIASTINOSCOPY    ? for dx sarcoid  ? RADIOLOGY WITH ANESTHESIA Left 05/17/2016  ? Procedure: left renal ablation;  Surgeon: Aletta Edouard, MD;  Location: [facility];  Service: Radiology;  Laterality: Left;  ? ? ?Home Medications:  ?Allergies as of 08/21/2021   ? ?   Reactions  ? Azithromycin Other (See Comments)  ? Sore mouth and fever blisters around mouth, sores in nose area as well  ? Doxazosin Shortness Of Breath  ? Acetaminophen Other (See Comments)  ? REACTION: UNKNOWN REACTION  ? Atenolol Other (See Comments)  ? REACTION: UNKNOWN REACTION  ? Hydrocodone Nausea And Vomiting  ? Hydrocodone-acetaminophen Nausea Only  ? Levofloxacin Other (See Comments)  ? Caused stomach problems.  ? Morphine Other (See Comments)  ? "made me crazy"  ? Penicillins Nausea And Vomiting, Other (See Comments)  ? Has patient had a PCN reaction causing immediate rash, facial/tongue/throat swelling, SOB or lightheadedness with hypotension: No ?Has patient had a PCN reaction causing severe rash involving mucus membranes or skin necrosis: No ?Has patient had a PCN reaction that required hospitalization No ?Has patient had a PCN reaction occurring within the last 10 years: No ?If all of the above answers are "NO", then may proceed with Cephalosporin use.  ?  Sulfonamide Derivatives Nausea And Vomiting  ? ?  ? ?  ?Medication List  ?  ? ?  ? Accurate as of August 21, 2021  8:52 AM. If you have any questions, ask your nurse or doctor.  ?  ?  ? ?  ? ?acyclovir 400 MG tablet ?Commonly known as: ZOVIRAX ?Take 1 tablet (400 mg total) by mouth 2 (two) times daily. ?  ?aspirin 81 MG tablet ?Take 81 mg by mouth every Monday, Wednesday, and Friday. ?  ?atorvastatin 40 MG tablet ?Commonly known as: LIPITOR ?TAKE 1 TABLET BY MOUTH IN  THE EVENING ?  ?brimonidine 0.2 % ophthalmic solution ?Commonly known as: ALPHAGAN ?Place 1 drop into both eyes 3 (three) times daily. Noon dosing only in right eye morning and evening in both eyes ?  ?dexamethasone 2 MG tablet ?Commonly known as: DECADRON ?TAKE 5 TABLETS(10 MG) BY MOUTH 1 TIME A WEEK ?  ?dorzolamide-timolol 22.3-6.8 MG/ML ophthalmic  solution ?Commonly known as: COSOPT ?Place 1 drop into both eyes 2 (two) times daily. ?  ?furosemide 20 MG tablet ?Commonly known as: LASIX ?If you notice any swelling, you may take 40 mg (two tablets) for the day. ?What ch

## 2021-08-23 ENCOUNTER — Encounter (HOSPITAL_COMMUNITY): Payer: Self-pay

## 2021-08-23 ENCOUNTER — Inpatient Hospital Stay (HOSPITAL_COMMUNITY): Payer: Medicare Other

## 2021-08-23 VITALS — BP 129/51 | HR 63 | Temp 97.8°F | Resp 18 | Wt 123.8 lb

## 2021-08-23 DIAGNOSIS — D649 Anemia, unspecified: Secondary | ICD-10-CM

## 2021-08-23 DIAGNOSIS — Z5112 Encounter for antineoplastic immunotherapy: Secondary | ICD-10-CM | POA: Diagnosis not present

## 2021-08-23 DIAGNOSIS — C9000 Multiple myeloma not having achieved remission: Secondary | ICD-10-CM

## 2021-08-23 DIAGNOSIS — Z79899 Other long term (current) drug therapy: Secondary | ICD-10-CM | POA: Diagnosis not present

## 2021-08-23 DIAGNOSIS — D630 Anemia in neoplastic disease: Secondary | ICD-10-CM | POA: Diagnosis not present

## 2021-08-23 LAB — IRON AND TIBC
Iron: 231 ug/dL — ABNORMAL HIGH (ref 45–182)
Saturation Ratios: 90 % — ABNORMAL HIGH (ref 17.9–39.5)
TIBC: 257 ug/dL (ref 250–450)
UIBC: 26 ug/dL

## 2021-08-23 LAB — CBC WITH DIFFERENTIAL/PLATELET
Abs Immature Granulocytes: 0.01 10*3/uL (ref 0.00–0.07)
Basophils Absolute: 0 10*3/uL (ref 0.0–0.1)
Basophils Relative: 0 %
Eosinophils Absolute: 0.1 10*3/uL (ref 0.0–0.5)
Eosinophils Relative: 1 %
HCT: 24.5 % — ABNORMAL LOW (ref 39.0–52.0)
Hemoglobin: 8 g/dL — ABNORMAL LOW (ref 13.0–17.0)
Immature Granulocytes: 0 %
Lymphocytes Relative: 18 %
Lymphs Abs: 0.8 10*3/uL (ref 0.7–4.0)
MCH: 33.8 pg (ref 26.0–34.0)
MCHC: 32.7 g/dL (ref 30.0–36.0)
MCV: 103.4 fL — ABNORMAL HIGH (ref 80.0–100.0)
Monocytes Absolute: 0.7 10*3/uL (ref 0.1–1.0)
Monocytes Relative: 16 %
Neutro Abs: 3 10*3/uL (ref 1.7–7.7)
Neutrophils Relative %: 65 %
Platelets: 45 10*3/uL — ABNORMAL LOW (ref 150–400)
RBC: 2.37 MIL/uL — ABNORMAL LOW (ref 4.22–5.81)
RDW: 20.3 % — ABNORMAL HIGH (ref 11.5–15.5)
WBC: 4.7 10*3/uL (ref 4.0–10.5)
nRBC: 0 % (ref 0.0–0.2)

## 2021-08-23 LAB — SAMPLE TO BLOOD BANK

## 2021-08-23 LAB — COMPREHENSIVE METABOLIC PANEL
ALT: 17 U/L (ref 0–44)
AST: 17 U/L (ref 15–41)
Albumin: 2.8 g/dL — ABNORMAL LOW (ref 3.5–5.0)
Alkaline Phosphatase: 51 U/L (ref 38–126)
Anion gap: 5 (ref 5–15)
BUN: 23 mg/dL (ref 8–23)
CO2: 24 mmol/L (ref 22–32)
Calcium: 8.9 mg/dL (ref 8.9–10.3)
Chloride: 104 mmol/L (ref 98–111)
Creatinine, Ser: 0.91 mg/dL (ref 0.61–1.24)
GFR, Estimated: >60 mL/min (ref >60)
Glucose, Bld: 83 mg/dL (ref 70–99)
Potassium: 3.5 mmol/L (ref 3.5–5.1)
Sodium: 133 mmol/L — ABNORMAL LOW (ref 135–145)
Total Bilirubin: 0.5 mg/dL (ref 0.3–1.2)
Total Protein: 8.8 g/dL — ABNORMAL HIGH (ref 6.5–8.1)

## 2021-08-23 LAB — MAGNESIUM: Magnesium: 1.7 mg/dL (ref 1.7–2.4)

## 2021-08-23 LAB — FOLATE: Folate: 10.6 ng/mL (ref >5.9)

## 2021-08-23 LAB — FERRITIN: Ferritin: 939 ng/mL — ABNORMAL HIGH (ref 24–336)

## 2021-08-23 LAB — VITAMIN B12: Vitamin B-12: 212 pg/mL (ref 180–914)

## 2021-08-23 MED ORDER — BORTEZOMIB CHEMO SQ INJECTION 3.5 MG (2.5MG/ML)
1.3000 mg/m2 | Freq: Once | INTRAMUSCULAR | Status: AC
  Administered 2021-08-23: 2.25 mg via SUBCUTANEOUS
  Filled 2021-08-23: qty 0.9

## 2021-08-23 NOTE — Progress Notes (Signed)
Patient presents today for Velcade injection. HGB 8.0 and platelets 45.  Vital signs within parameters for treatment today. Message sent to Dr. Delton Coombes.  ? ?Message received from Dr. Delton Coombes okay to proceed with treatment. ? ?Messaged Trilby Leaver LCSW pertaining to patient not having meals cooked at home. Patient states, " I have a son that lives in [place] that comes weekly to set my pills out. " Patient states, " My wife is in a wheel chair and broken up and I miss having meals cooked by my son." Trilby Leaver notified and message received she will reach out to this patient for meals.  ? ?Treatment given today per MD orders. Tolerated without adverse affects. Vital signs stable. No complaints at this time. Discharged from clinic ambulatory in stable condition. Alert and oriented x 3. F/U with [facility] as scheduled.   ?

## 2021-08-23 NOTE — Patient Instructions (Signed)
[facility]  Discharge Instructions: ?Thank you for choosing [facility] to provide your oncology and hematology care.  ?If you have a lab appointment with the [facility], please come in thru the Main Entrance and check in at the main information desk. ? ?Wear comfortable clothing and clothing appropriate for easy access to any Portacath or PICC line.  ? ?We strive to give you quality time with your provider. You may need to reschedule your appointment if you arrive late (15 or more minutes).  Arriving late affects you and other patients whose appointments are after yours.  Also, if you miss three or more appointments without notifying the office, you may be dismissed from the clinic at the provider?s discretion.    ?  ?For prescription refill requests, have your pharmacy contact our office and allow 72 hours for refills to be completed.   ? ?Today you received the following chemotherapy and/or immunotherapy agents Velcade     ?  ?To help prevent nausea and vomiting after your treatment, we encourage you to take your nausea medication as directed. ? ?BELOW ARE SYMPTOMS THAT SHOULD BE REPORTED IMMEDIATELY: ?*FEVER GREATER THAN 100.4 F (38 ?C) OR HIGHER ?*CHILLS OR SWEATING ?*NAUSEA AND VOMITING THAT IS NOT CONTROLLED WITH YOUR NAUSEA MEDICATION ?*UNUSUAL SHORTNESS OF BREATH ?*UNUSUAL BRUISING OR BLEEDING ?*URINARY PROBLEMS (pain or burning when urinating, or frequent urination) ?*BOWEL PROBLEMS (unusual diarrhea, constipation, pain near the anus) ?TENDERNESS IN MOUTH AND THROAT WITH OR WITHOUT PRESENCE OF ULCERS (sore throat, sores in mouth, or a toothache) ?UNUSUAL RASH, SWELLING OR PAIN  ?UNUSUAL VAGINAL DISCHARGE OR ITCHING  ? ?Items with * indicate a potential emergency and should be followed up as soon as possible or go to the Emergency Department if any problems should occur. ? ?Please show the CHEMOTHERAPY ALERT CARD or IMMUNOTHERAPY ALERT CARD at check-in to the Emergency  Department and triage nurse. ? ?Should you have questions after your visit or need to cancel or reschedule your appointment, please contact [facility] [phone]  and follow the prompts.  Office hours are 8:00 a.m. to 4:30 p.m. Monday - Friday. Please note that voicemails left after 4:00 p.m. may not be returned until the following business day.  We are closed weekends and major holidays. You have access to a nurse at all times for urgent questions. Please call the main number to the clinic [phone] and follow the prompts. ? ?For any non-urgent questions, you may also contact your provider using MyChart. We now offer e-Visits for anyone 66 and older to request care online for non-urgent symptoms. For details visit [email or web address]. ?  ?Also download the MyChart app! Go to the app store, search "MyChart", open the app, select [facility], and log in with your MyChart username and password. ? ?Due to Covid, a mask is required upon entering the hospital/clinic. If you do not have a mask, one will be given to you upon arrival. For doctor visits, patients may have 1 support person aged 56 or older with them. For treatment visits, patients cannot have anyone with them due to current Covid guidelines and our immunocompromised population.  ?

## 2021-08-24 LAB — KAPPA/LAMBDA LIGHT CHAINS
Kappa free light chain: 7.9 mg/L (ref 3.3–19.4)
Kappa, lambda light chain ratio: 0.02 — ABNORMAL LOW (ref 0.26–1.65)
Lambda free light chains: 484.5 mg/L — ABNORMAL HIGH (ref 5.7–26.3)

## 2021-08-27 ENCOUNTER — Encounter: Payer: Self-pay | Admitting: Licensed Clinical Social Worker

## 2021-08-27 DIAGNOSIS — D649 Anemia, unspecified: Secondary | ICD-10-CM | POA: Diagnosis not present

## 2021-08-27 DIAGNOSIS — D696 Thrombocytopenia, unspecified: Secondary | ICD-10-CM | POA: Diagnosis not present

## 2021-08-27 DIAGNOSIS — C9000 Multiple myeloma not having achieved remission: Secondary | ICD-10-CM | POA: Diagnosis not present

## 2021-08-27 LAB — PROTEIN ELECTROPHORESIS, SERUM
A/G Ratio: 0.7 (ref 0.7–1.7)
Albumin ELP: 3.4 g/dL (ref 2.9–4.4)
Alpha-1-Globulin: 0.3 g/dL (ref 0.0–0.4)
Alpha-2-Globulin: 0.6 g/dL (ref 0.4–1.0)
Beta Globulin: 0.6 g/dL — ABNORMAL LOW (ref 0.7–1.3)
Gamma Globulin: 3.6 g/dL — ABNORMAL HIGH (ref 0.4–1.8)
Globulin, Total: 5.1 g/dL — ABNORMAL HIGH (ref 2.2–3.9)
M-Spike, %: 3.4 g/dL — ABNORMAL HIGH
Total Protein ELP: 8.5 g/dL (ref 6.0–8.5)

## 2021-08-29 ENCOUNTER — Encounter (HOSPITAL_COMMUNITY): Payer: Self-pay | Admitting: Licensed Clinical Social Worker

## 2021-08-29 DIAGNOSIS — C9000 Multiple myeloma not having achieved remission: Secondary | ICD-10-CM

## 2021-08-29 NOTE — Progress Notes (Signed)
[facility] Clinical Social Work  ?Initial Assessment ? ? ?Dale [name] is a 88 y.o. year old male contacted by phone. Clinical Social Work was referred by nurse for assessment of psychosocial needs.  ? ?SDOH (Social Determinants of Health) assessments performed: Yes ?  ?Distress Screen completed: No ?   ? View : No data to display.  ?  ?  ?  ? ? ? ? ?Family/Social Information:  ?Housing Arrangement: Pt resides w/ his wife Dale [name]).  ?Family members/support persons in your life? Family- Pt's son Dale [name]) reportedly is able to stop by weekly or every two weeks on occasion to organize medication. ?Transportation concerns: No transportation concerns at present.  Pt continues to drive himself to and from appointments.  ?Employment: Retired. Income source: [employer] ?Financial concerns: No ?Type of concern: None ?Food access concerns: Spouse states it is becoming more difficult to prepare meals as she uses both a walker and a wheelchair.  Pt many times does not feel well following treatment and is not always able to assist with meal preparation. ?Religious or spiritual practice: not discussed ?Services Currently in place:  none ? ?Coping/ Adjustment to diagnosis: ?Patient understands treatment plan and what happens next? yes ?Concerns about diagnosis and/or treatment: I'm not especially worried about anything ?Patient reported stressors: preparing meals ?Hopes and priorities: Pt's priority is to continue treatment w/ the hope of continued positive results ?Patient enjoys being outside ?Current coping skills/ strengths: Physical Health  ? ? ? SUMMARY: ?Current SDOH Barriers:  ?Ability to independently fix meals ? ?Clinical Social Work Clinical Goal(s):  ?patient will work with SW to address concerns related to preparing meals ? ?Interventions: ?Discussed common feeling and emotions when being diagnosed with cancer, and the importance of support during treatment ?Informed patient of the support team roles and  support services at [facility] ?Provided CSW contact information and encouraged patient to call with any questions or concerns ?Referred patient to [employer] who will deliver 10 pre-prepared meals every Thursday to pt's home.  Pt also referred to Duanne Limerick for additional support if needed. ? ? ?Follow Up Plan: Patient will contact CSW with any support or resource needs ?Patient verbalizes understanding of plan: Yes ? ? ? ?Henriette Combs, LCSW ?

## 2021-08-30 ENCOUNTER — Inpatient Hospital Stay (HOSPITAL_BASED_OUTPATIENT_CLINIC_OR_DEPARTMENT_OTHER): Payer: Medicare Other | Admitting: Hematology

## 2021-08-30 ENCOUNTER — Inpatient Hospital Stay (HOSPITAL_COMMUNITY): Payer: Medicare Other

## 2021-08-30 ENCOUNTER — Other Ambulatory Visit (HOSPITAL_COMMUNITY): Payer: Self-pay

## 2021-08-30 VITALS — BP 153/60 | HR 59 | Temp 97.6°F | Resp 17 | Ht 68.0 in | Wt 127.3 lb

## 2021-08-30 DIAGNOSIS — D649 Anemia, unspecified: Secondary | ICD-10-CM

## 2021-08-30 DIAGNOSIS — C9000 Multiple myeloma not having achieved remission: Secondary | ICD-10-CM | POA: Diagnosis not present

## 2021-08-30 DIAGNOSIS — Z5112 Encounter for antineoplastic immunotherapy: Secondary | ICD-10-CM | POA: Diagnosis not present

## 2021-08-30 DIAGNOSIS — D630 Anemia in neoplastic disease: Secondary | ICD-10-CM | POA: Diagnosis not present

## 2021-08-30 DIAGNOSIS — Z79899 Other long term (current) drug therapy: Secondary | ICD-10-CM | POA: Diagnosis not present

## 2021-08-30 LAB — COMPREHENSIVE METABOLIC PANEL
ALT: 16 U/L (ref 0–44)
AST: 17 U/L (ref 15–41)
Albumin: 2.8 g/dL — ABNORMAL LOW (ref 3.5–5.0)
Alkaline Phosphatase: 52 U/L (ref 38–126)
Anion gap: 4 — ABNORMAL LOW (ref 5–15)
BUN: 20 mg/dL (ref 8–23)
CO2: 25 mmol/L (ref 22–32)
Calcium: 8.9 mg/dL (ref 8.9–10.3)
Chloride: 105 mmol/L (ref 98–111)
Creatinine, Ser: 0.88 mg/dL (ref 0.61–1.24)
GFR, Estimated: >60 mL/min (ref >60)
Glucose, Bld: 96 mg/dL (ref 70–99)
Potassium: 4 mmol/L (ref 3.5–5.1)
Sodium: 134 mmol/L — ABNORMAL LOW (ref 135–145)
Total Bilirubin: 0.5 mg/dL (ref 0.3–1.2)
Total Protein: 8.2 g/dL — ABNORMAL HIGH (ref 6.5–8.1)

## 2021-08-30 LAB — CBC WITH DIFFERENTIAL/PLATELET
Abs Immature Granulocytes: 0 10*3/uL (ref 0.00–0.07)
Basophils Absolute: 0 10*3/uL (ref 0.0–0.1)
Basophils Relative: 0 %
Eosinophils Absolute: 0 10*3/uL (ref 0.0–0.5)
Eosinophils Relative: 1 %
HCT: 21.6 % — ABNORMAL LOW (ref 39.0–52.0)
Hemoglobin: 7 g/dL — ABNORMAL LOW (ref 13.0–17.0)
Immature Granulocytes: 0 %
Lymphocytes Relative: 32 %
Lymphs Abs: 0.8 10*3/uL (ref 0.7–4.0)
MCH: 34 pg (ref 26.0–34.0)
MCHC: 32.4 g/dL (ref 30.0–36.0)
MCV: 104.9 fL — ABNORMAL HIGH (ref 80.0–100.0)
Monocytes Absolute: 0.4 10*3/uL (ref 0.1–1.0)
Monocytes Relative: 16 %
Neutro Abs: 1.3 10*3/uL — ABNORMAL LOW (ref 1.7–7.7)
Neutrophils Relative %: 51 %
Platelets: 59 10*3/uL — ABNORMAL LOW (ref 150–400)
RBC: 2.06 MIL/uL — ABNORMAL LOW (ref 4.22–5.81)
RDW: 20.9 % — ABNORMAL HIGH (ref 11.5–15.5)
WBC: 2.5 10*3/uL — ABNORMAL LOW (ref 4.0–10.5)
nRBC: 0 % (ref 0.0–0.2)

## 2021-08-30 LAB — SAMPLE TO BLOOD BANK

## 2021-08-30 LAB — MAGNESIUM: Magnesium: 1.9 mg/dL (ref 1.7–2.4)

## 2021-08-30 MED ORDER — BORTEZOMIB CHEMO SQ INJECTION 3.5 MG (2.5MG/ML)
1.3000 mg/m2 | Freq: Once | INTRAMUSCULAR | Status: AC
  Administered 2021-08-30: 2.25 mg via SUBCUTANEOUS
  Filled 2021-08-30: qty 0.9

## 2021-08-30 NOTE — Patient Instructions (Signed)
[place] at [facility] ?Discharge Instructions ? ? ?You were seen and examined today by Dr. Delton Coombes. ? ?He reviewed your lab work which is stable.  ? ?We will proceed with your injection today.  ? ?Return as scheduled.  ? ? ?Thank you for choosing [place] at [facility] to provide your oncology and hematology care.  To afford each patient quality time with our provider, please arrive at least 15 minutes before your scheduled appointment time.  ? ?If you have a lab appointment with the [facility] please come in thru the Main Entrance and check in at the main information desk. ? ?You need to re-schedule your appointment should you arrive 10 or more minutes late.  We strive to give you quality time with our providers, and arriving late affects you and other patients whose appointments are after yours.  Also, if you no show three or more times for appointments you may be dismissed from the clinic at the providers discretion.     ?Again, thank you for choosing [facility].  Our hope is that these requests will decrease the amount of time that you wait before being seen by our physicians.       ?_____________________________________________________________ ? ?Should you have questions after your visit to [facility] please contact our office at [phone] and follow the prompts.  Our office hours are 8:00 a.m. and 4:30 p.m. Monday - Friday.  Please note that voicemails left after 4:00 p.m. may not be returned until the following business day.  We are closed weekends and major holidays.  You do have access to a nurse 24-7, just call the main number to the clinic [phone] and do not press any options, hold on the line and a nurse will answer the phone.   ? ?For prescription refill requests, have your pharmacy contact our office and allow 72 hours.   ? ?Due to Covid, you will need to wear a mask upon entering the hospital. If you do  not have a mask, a mask will be given to you at the Main Entrance upon arrival. For doctor visits, patients may have 1 support person age 86 or older with them. For treatment visits, patients can not have anyone with them due to social distancing guidelines and our immunocompromised population.  ? ?   ?

## 2021-08-30 NOTE — Progress Notes (Signed)
Patient has been examined by Dr. Katragadda, and vital signs and labs have been reviewed. ANC, Creatinine, LFTs, hemoglobin, and platelets are within treatment parameters per M.D. - pt may proceed with treatment.    °

## 2021-08-30 NOTE — Progress Notes (Signed)
Patient presents today for Velcade injection per providers order.  Labs and vital signs within parameters for treatment.  ? ?Message received from Anastasio Champion RN/Dr. Delton Coombes patient okay for treatment. ? ?Stable during administration without incident; injection site WNL; see MAR for injection details.  Patient tolerated procedure well and without incident.  No questions or complaints noted at this time.  Discharge from clinic ambulatory in stable condition.  Alert and oriented X 3.  Follow up with [facility] as scheduled.  ?

## 2021-08-30 NOTE — Patient Instructions (Signed)
[facility]  Discharge Instructions: ?Thank you for choosing [facility] to provide your oncology and hematology care.  ?If you have a lab appointment with the [facility], please come in thru the Main Entrance and check in at the main information desk. ? ?Wear comfortable clothing and clothing appropriate for easy access to any Portacath or PICC line.  ? ?We strive to give you quality time with your provider. You may need to reschedule your appointment if you arrive late (15 or more minutes).  Arriving late affects you and other patients whose appointments are after yours.  Also, if you miss three or more appointments without notifying the office, you may be dismissed from the clinic at the provider?s discretion.    ?  ?For prescription refill requests, have your pharmacy contact our office and allow 72 hours for refills to be completed.   ? ?Today you received the following chemotherapy and/or immunotherapy agents Velcade    ?  ?To help prevent nausea and vomiting after your treatment, we encourage you to take your nausea medication as directed. ? ?BELOW ARE SYMPTOMS THAT SHOULD BE REPORTED IMMEDIATELY: ?*FEVER GREATER THAN 100.4 F (38 ?C) OR HIGHER ?*CHILLS OR SWEATING ?*NAUSEA AND VOMITING THAT IS NOT CONTROLLED WITH YOUR NAUSEA MEDICATION ?*UNUSUAL SHORTNESS OF BREATH ?*UNUSUAL BRUISING OR BLEEDING ?*URINARY PROBLEMS (pain or burning when urinating, or frequent urination) ?*BOWEL PROBLEMS (unusual diarrhea, constipation, pain near the anus) ?TENDERNESS IN MOUTH AND THROAT WITH OR WITHOUT PRESENCE OF ULCERS (sore throat, sores in mouth, or a toothache) ?UNUSUAL RASH, SWELLING OR PAIN  ?UNUSUAL VAGINAL DISCHARGE OR ITCHING  ? ?Items with * indicate a potential emergency and should be followed up as soon as possible or go to the Emergency Department if any problems should occur. ? ?Please show the CHEMOTHERAPY ALERT CARD or IMMUNOTHERAPY ALERT CARD at check-in to the Emergency  Department and triage nurse. ? ?Should you have questions after your visit or need to cancel or reschedule your appointment, please contact [facility] [phone]  and follow the prompts.  Office hours are 8:00 a.m. to 4:30 p.m. Monday - Friday. Please note that voicemails left after 4:00 p.m. may not be returned until the following business day.  We are closed weekends and major holidays. You have access to a nurse at all times for urgent questions. Please call the main number to the clinic [phone] and follow the prompts. ? ?For any non-urgent questions, you may also contact your provider using MyChart. We now offer e-Visits for anyone 25 and older to request care online for non-urgent symptoms. For details visit [email or web address]. ?  ?Also download the MyChart app! Go to the app store, search "MyChart", open the app, select [facility], and log in with your MyChart username and password. ? ?Due to Covid, a mask is required upon entering the hospital/clinic. If you do not have a mask, one will be given to you upon arrival. For doctor visits, patients may have 1 support person aged 11 or older with them. For treatment visits, patients cannot have anyone with them due to current Covid guidelines and our immunocompromised population.  ?

## 2021-08-30 NOTE — Progress Notes (Signed)
? ?Dale [name] ?[address] ?[place], [place] [address] ? ? ?CLINIC:  ?Medical Oncology/Hematology ? ?PCP:  ?Redmond School, MD ?[address] / [place] [address] ?[phone] ? ? ?REASON FOR VISIT:  ?Follow-up for multiple myeloma and anemia ? ?PRIOR THERAPY: none ? ?CURRENT THERAPY: VRd weekly q21d ? ?BRIEF ONCOLOGIC HISTORY:  ?Oncology History  ?Multiple myeloma ([place])  ?01/15/2021 Initial Diagnosis  ? Multiple myeloma ([place]) ? ?  ?06/25/2021 -  Chemotherapy  ? Patient is on Treatment Plan : MYELOMA NON-TRANSPLANT CANDIDATES VRd weekly q21d  ? ?  ?  ? ? ?CANCER STAGING: ? Cancer Staging  ?No matching staging information was found for the patient. ? ?INTERVAL HISTORY:  ?[name], a 88 y.o. male, returns for routine follow-up and consideration for next cycle of chemotherapy. [name] was last seen on 08/09/2021. ? ?Due for cycle #3 of VRd today.  ? ?Overall, he tells me he has been feeling pretty well. He denies nausea and vomiting. He reports 2 episodes of diarrhea on 4/20. He denies fatigue following the injection. He continues to take 5 tablets of dexamethasone once a week. He reports occasional numbness/tingling and ankle swellings. He denies SOB, lightheadedness, and CP. ? ?Overall, he feels ready for next cycle of chemo today.  ? ? ?REVIEW OF SYSTEMS:  ?Review of Systems  ?Constitutional:  Negative for appetite change and fatigue.  ?Respiratory:  Positive for cough. Negative for shortness of breath.   ?Cardiovascular:  Negative for chest pain.  ?Gastrointestinal:  Positive for diarrhea (x2). Negative for nausea and vomiting.  ?Neurological:  Positive for headaches and numbness (occasional). Negative for light-headedness.  ?All other systems reviewed and are negative. ? ?PAST MEDICAL/SURGICAL HISTORY:  ?Past Medical History:  ?Diagnosis Date  ? Allergic rhinitis   ? Anal fissure   ? Anginal pain ([place])   ? Asthma   ? Cancer [facility])   ? skin  ? Chest pain   ? CHF (congestive heart  failure) ([place])   ? Coronary heart disease   ? s/p stenting. cath in 01/2012 noncritical occlusion  ? Dysrhythmia   ? 1st degree heart block  ? GERD (gastroesophageal reflux disease)   ? Glaucoma   ? Hiatal hernia   ? Hyperlipidemia   ? Hypertension   ? Hypothyroidism   ? Idiopathic thrombocytopenic purpura ([place]) 2002  ? Macular degeneration   ? Nephrolithiasis   ? PUD (peptic ulcer disease)   ? remote  ? Sarcoidosis   ? pulmonary  ? Schatzki's ring   ? ?Past Surgical History:  ?Procedure Laterality Date  ? cardiac stents    ? COLONOSCOPY  10/30/2006  ? Normal rectum, sigmoid diverticula.Remainder of colonic mucosa appeared normal.  ? CORONARY ANGIOPLASTY WITH STENT PLACEMENT    ? about 10 years ago per pt (around 2007)  ? CYSTOSCOPY WITH RETROGRADE PYELOGRAM, URETEROSCOPY AND STENT PLACEMENT Left 06/16/2017  ? Procedure: CYSTOSCOPY WITH RETROGRADE PYELOGRAM, URETEROSCOPY,STONE EXTRACTION  AND STENT PLACEMENT;  Surgeon: Franchot Gallo, MD;  Location: [facility];  Service: Urology;  Laterality: Left;  ? ESOPHAGOGASTRODUODENOSCOPY  06/19/2004  ? Two esophageal rings and esophageal web as described above.  All of these were disrupted by passing 56-French Venia Minks dilator/ Candida esophagitis,which appears to be incidental given history of   antibiotic use, but nevertheless will be treated.  ? ESOPHAGOGASTRODUODENOSCOPY  10/30/2006  ? Distal tandem esophageal ring status post dilation disruption as  described above.  Otherwise normal esophagus/  Small hiatal hernia otherwise normal stomach,  D1 and D2  ? ESOPHAGOGASTRODUODENOSCOPY N/A 03/22/2015  ? Dr.Rourk- noncritical schatzki's ring and hiatal hernia-o/w normal EGD.   ? ESOPHAGOGASTRODUODENOSCOPY (EGD) WITH ESOPHAGEAL DILATION  03/04/2012  ? RMR- schatzki's ring, hiatal hernia, polypoid gastric mucosa, bx= minimally active gastritis.  ? HOLMIUM LASER APPLICATION Left 5/72/6203  ? Procedure: HOLMIUM LASER APPLICATION;  Surgeon: Franchot Gallo, MD;  Location: [facility];   Service: Urology;  Laterality: Left;  ? IR GENERIC HISTORICAL  03/06/2016  ? IR RADIOLOGIST EVAL & MGMT 03/06/2016 Aletta Edouard, MD [facility] INTERV RAD  ? IR GENERIC HISTORICAL  06/18/2016  ? IR RADIOLOGIST EVAL & MGMT 06/18/2016 Aletta Edouard, MD [facility] INTERV RAD  ? IR RADIOLOGIST EVAL & MGMT  10/01/2016  ? IR RADIOLOGIST EVAL & MGMT  10/15/2017  ? IR RADIOLOGIST EVAL & MGMT  12/24/2018  ? IR RADIOLOGIST EVAL & MGMT  01/04/2020  ? IR RADIOLOGIST EVAL & MGMT  06/20/2021  ? LEFT HEART CATH N/A 02/02/2012  ? Procedure: LEFT HEART CATH;  Surgeon: Lorretta Harp, MD;  Location: [facility] C[facility] LAB;  Service: Cardiovascular;  Laterality: N/A;  ? MEDIASTINOSCOPY    ? for dx sarcoid  ? RADIOLOGY WITH ANESTHESIA Left 05/17/2016  ? Procedure: left renal ablation;  Surgeon: Aletta Edouard, MD;  Location: [facility];  Service: Radiology;  Laterality: Left;  ? ? ?SOCIAL HISTORY:  ?Social History  ? ?Socioeconomic History  ? Marital status: Married  ?  Spouse name: Not on file  ? Number of children: 1  ? Years of education: Not on file  ? Highest education level: Not on file  ?Occupational History  ? Occupation: Retired  ?  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  Employer: RETIRED  ?Tobacco Use  ? Smoking status: Former  ?  Packs/day: 0.10  ?  Years: 2.00  ?  Pack years: 0.20  ?  Types: Cigarettes, Cigars  ?  Quit date: 05/06/1970  ?  Years since quitting: 51.3  ? Smokeless tobacco: Never  ?Vaping Use  ? Vaping Use: Never used  ?Substance and Sexual Activity  ? Alcohol use: No  ?  Alcohol/week: 0.0 standard drinks  ? Drug use: No  ? Sexual activity: Never  ?Other Topics Concern  ? Not on file  ?Social History Narrative  ? Not on file  ? ?Social Determinants of Health  ? ?Financial Resource Strain: Not on file  ?Food Insecurity: No Food Insecurity  ? Worried About Charity fundraiser in the Last Year: Never true  ? Ran Out of Food in the Last Year: Never true  ?Transportation Needs: Not on file  ?Physical Activity: Not on file  ?Stress: Not on file   ?Social Connections: Not on file  ?Intimate Partner Violence: Not on file  ? ? ?FAMILY HISTORY:  ?Family History  ?Problem Relation Age of Onset  ? Heart disease Father   ?     deceased age 58  ? Stroke Mother   ? Alzheimer's disease Mother   ? Heart attack Brother   ?     deceased age 71  ? Cancer Other   ?     niece  ? Colon cancer Neg Hx   ? ? ?CURRENT MEDICATIONS:  ?Current Outpatient Medications  ?Medication Sig Dispense Refill  ? acyclovir (ZOVIRAX) 400 MG tablet Take 1 tablet (400 mg total) by mouth 2 (two) times daily. 60 tablet 6  ? aspirin 81 MG tablet Take 81 mg by mouth every Monday, Wednesday, and Friday.    ?  atorvastatin (LIPITOR) 40 MG tablet TAKE 1 TABLET BY MOUTH IN  THE EVENING (Patient taking differently: Take 40 mg by mouth every evening.) 90 tablet 3  ? Bortezomib (VELCADE IJ) Inject as directed. Three weeks on and one weeks off.    ? brimonidine (ALPHAGAN) 0.2 % ophthalmic solution Place 1 drop into both eyes 3 (three) times daily. Noon dosing only in right eye morning and evening in both eyes    ? dexamethasone (DECADRON) 2 MG tablet TAKE 5 TABLETS(10 MG) BY MOUTH 1 TIME A WEEK 20 tablet 3  ? dorzolamide-timolol (COSOPT) 22.3-6.8 MG/ML ophthalmic solution Place 1 drop into both eyes 2 (two) times daily.    ? furosemide (LASIX) 20 MG tablet If you notice any swelling, you may take 40 mg (two tablets) for the day. (Patient taking differently: Take 20 mg by mouth daily. If more swelling take an additional 20 mg in the afternoon) 90 tablet 3  ? isosorbide mononitrate (IMDUR) 60 MG 24 hr tablet TAKE 1 AND 1/2 TABLETS BY  MOUTH IN THE MORNING AND  1/2 TABLET AT NIGHT (Patient taking differently: TAKE 60 mg BY  MOUTH IN THE MORNING AND  30 mg TABLET AT NIGHT) 180 tablet 3  ? levothyroxine (SYNTHROID) 75 MCG tablet Take 75 mcg by mouth daily.    ? losartan (COZAAR) 100 MG tablet TAKE 1 TABLET BY MOUTH  DAILY 90 tablet 2  ? metoCLOPramide (REGLAN) 10 MG tablet Take 1 tablet (10 mg total) by mouth  every 6 (six) hours as needed for nausea or vomiting. 15 tablet 0  ? metoprolol tartrate (LOPRESSOR) 25 MG tablet TAKE 1 TABLET BY MOUTH IN  THE MORNING AND ONE-HALF  TABLET BY MOUTH IN THE  EVENING 135 tablet

## 2021-09-06 ENCOUNTER — Inpatient Hospital Stay (HOSPITAL_COMMUNITY): Payer: Medicare Other

## 2021-09-06 ENCOUNTER — Inpatient Hospital Stay (HOSPITAL_COMMUNITY): Payer: Medicare Other | Attending: Hematology

## 2021-09-06 VITALS — BP 143/50 | HR 62 | Temp 97.5°F | Resp 16

## 2021-09-06 DIAGNOSIS — D649 Anemia, unspecified: Secondary | ICD-10-CM | POA: Insufficient documentation

## 2021-09-06 DIAGNOSIS — C9000 Multiple myeloma not having achieved remission: Secondary | ICD-10-CM | POA: Diagnosis not present

## 2021-09-06 DIAGNOSIS — D696 Thrombocytopenia, unspecified: Secondary | ICD-10-CM

## 2021-09-06 DIAGNOSIS — Z5112 Encounter for antineoplastic immunotherapy: Secondary | ICD-10-CM | POA: Insufficient documentation

## 2021-09-06 DIAGNOSIS — Z79899 Other long term (current) drug therapy: Secondary | ICD-10-CM | POA: Insufficient documentation

## 2021-09-06 LAB — CBC WITH DIFFERENTIAL/PLATELET
Abs Immature Granulocytes: 0.01 10*3/uL (ref 0.00–0.07)
Basophils Absolute: 0 10*3/uL (ref 0.0–0.1)
Basophils Relative: 0 %
Eosinophils Absolute: 0.1 10*3/uL (ref 0.0–0.5)
Eosinophils Relative: 2 %
HCT: 20.7 % — ABNORMAL LOW (ref 39.0–52.0)
Hemoglobin: 6.8 g/dL — CL (ref 13.0–17.0)
Immature Granulocytes: 0 %
Lymphocytes Relative: 30 %
Lymphs Abs: 1 10*3/uL (ref 0.7–4.0)
MCH: 34.9 pg — ABNORMAL HIGH (ref 26.0–34.0)
MCHC: 32.9 g/dL (ref 30.0–36.0)
MCV: 106.2 fL — ABNORMAL HIGH (ref 80.0–100.0)
Monocytes Absolute: 0.7 10*3/uL (ref 0.1–1.0)
Monocytes Relative: 20 %
Neutro Abs: 1.6 10*3/uL — ABNORMAL LOW (ref 1.7–7.7)
Neutrophils Relative %: 48 %
Platelets: 55 10*3/uL — ABNORMAL LOW (ref 150–400)
RBC: 1.95 MIL/uL — ABNORMAL LOW (ref 4.22–5.81)
RDW: 22.1 % — ABNORMAL HIGH (ref 11.5–15.5)
WBC Morphology: >10
WBC: 3.3 10*3/uL — ABNORMAL LOW (ref 4.0–10.5)
nRBC: 0 % (ref 0.0–0.2)

## 2021-09-06 LAB — PREPARE RBC (CROSSMATCH)

## 2021-09-06 LAB — COMPREHENSIVE METABOLIC PANEL
ALT: 17 U/L (ref 0–44)
AST: 17 U/L (ref 15–41)
Albumin: 2.8 g/dL — ABNORMAL LOW (ref 3.5–5.0)
Alkaline Phosphatase: 55 U/L (ref 38–126)
Anion gap: 3 — ABNORMAL LOW (ref 5–15)
BUN: 24 mg/dL — ABNORMAL HIGH (ref 8–23)
CO2: 27 mmol/L (ref 22–32)
Calcium: 8.7 mg/dL — ABNORMAL LOW (ref 8.9–10.3)
Chloride: 104 mmol/L (ref 98–111)
Creatinine, Ser: 0.84 mg/dL (ref 0.61–1.24)
GFR, Estimated: >60 mL/min (ref >60)
Glucose, Bld: 98 mg/dL (ref 70–99)
Potassium: 3.9 mmol/L (ref 3.5–5.1)
Sodium: 134 mmol/L — ABNORMAL LOW (ref 135–145)
Total Bilirubin: 0.9 mg/dL (ref 0.3–1.2)
Total Protein: 8.1 g/dL (ref 6.5–8.1)

## 2021-09-06 LAB — SAMPLE TO BLOOD BANK

## 2021-09-06 LAB — MAGNESIUM: Magnesium: 1.8 mg/dL (ref 1.7–2.4)

## 2021-09-06 MED ORDER — BORTEZOMIB CHEMO SQ INJECTION 3.5 MG (2.5MG/ML)
1.3000 mg/m2 | Freq: Once | INTRAMUSCULAR | Status: DC
  Filled 2021-09-06: qty 0.9

## 2021-09-06 NOTE — Progress Notes (Unsigned)
CRITICAL VALUE ALERT ?Critical value received:  HGB 6.8 . ?Date of notification:  09/06/2021 ?Time of notification: 13:06 pm  ?Critical value read back:  Yes.   ?Nurse who received alert:  B. Tamasha Laplante RN ?MD notified time and response:  Dr. Delton Coombes.  ? ?Patient to receive 1 unit of PRBC's tomorrow per Dr. Tomie China standing order.   ?

## 2021-09-06 NOTE — Patient Instructions (Addendum)
[facility]  Discharge Instructions: Thank you for choosing [facility] to provide your oncology and hematology care.  If you have a lab appointment with the Cancer Center, please come in thru the Main Entrance and check in at the main information desk.  Wear comfortable clothing and clothing appropriate for easy access to any Portacath or PICC line.   We strive to give you quality time with your provider. You may need to reschedule your appointment if you arrive late (15 or more minutes).  Arriving late affects you and other patients whose appointments are after yours.  Also, if you miss three or more appointments without notifying the office, you may be dismissed from the clinic at the provider's discretion.      For prescription refill requests, have your pharmacy contact our office and allow 72 hours for refills to be completed.        To help prevent nausea and vomiting after your treatment, we encourage you to take your nausea medication as directed.  BELOW ARE SYMPTOMS THAT SHOULD BE REPORTED IMMEDIATELY: *FEVER GREATER THAN 100.4 F (38 C) OR HIGHER *CHILLS OR SWEATING *NAUSEA AND VOMITING THAT IS NOT CONTROLLED WITH YOUR NAUSEA MEDICATION *UNUSUAL SHORTNESS OF BREATH *UNUSUAL BRUISING OR BLEEDING *URINARY PROBLEMS (pain or burning when urinating, or frequent urination) *BOWEL PROBLEMS (unusual diarrhea, constipation, pain near the anus) TENDERNESS IN MOUTH AND THROAT WITH OR WITHOUT PRESENCE OF ULCERS (sore throat, sores in mouth, or a toothache) UNUSUAL RASH, SWELLING OR PAIN  UNUSUAL VAGINAL DISCHARGE OR ITCHING   Items with * indicate a potential emergency and should be followed up as soon as possible or go to the Emergency Department if any problems should occur.  Please show the CHEMOTHERAPY ALERT CARD or IMMUNOTHERAPY ALERT CARD at check-in to the Emergency Department and triage nurse.  Should you have questions after your visit or need to cancel  or reschedule your appointment, please contact [facility] [phone]  and follow the prompts.  Office hours are 8:00 a.m. to 4:30 p.m. Monday - Friday. Please note that voicemails left after 4:00 p.m. may not be returned until the following business day.  We are closed weekends and major holidays. You have access to a nurse at all times for urgent questions. Please call the main number to the clinic [phone] and follow the prompts.  For any non-urgent questions, you may also contact your provider using MyChart. We now offer e-Visits for anyone 18 and older to request care online for non-urgent symptoms. For details visit [email or web address].   Also download the MyChart app! Go to the app store, search "MyChart", open the app, select [facility], and log in with your MyChart username and password.  Due to Covid, a mask is required upon entering the hospital/clinic. If you do not have a mask, one will be given to you upon arrival. For doctor visits, patients may have 1 support person aged 18 or older with them. For treatment visits, patients cannot have anyone with them due to current Covid guidelines and our immunocompromised population.  

## 2021-09-06 NOTE — Progress Notes (Signed)
Platelets 55, HGB 6.8, Reported to Dr. Delton Coombes by secure chat. Message received from A. Ouida Sills RN / Dr. Delton Coombes to proceed with treatment today. Patient will receive 1 unit of blood tomorrow at 08:15 am.  ?

## 2021-09-06 NOTE — Progress Notes (Signed)
Patient is to return tomorrow per Dr. Delton Coombes for blood transfusion and Velcade injection. ? ?Discharge from clinic ambulatory in stable condition.  Alert and oriented X 3.  Follow up with [facility] as scheduled.  ?

## 2021-09-07 ENCOUNTER — Inpatient Hospital Stay (HOSPITAL_COMMUNITY): Payer: Medicare Other

## 2021-09-07 ENCOUNTER — Encounter (HOSPITAL_COMMUNITY): Payer: Self-pay

## 2021-09-07 VITALS — BP 137/62 | HR 57 | Temp 97.5°F | Resp 18

## 2021-09-07 DIAGNOSIS — Z79899 Other long term (current) drug therapy: Secondary | ICD-10-CM | POA: Diagnosis not present

## 2021-09-07 DIAGNOSIS — C9000 Multiple myeloma not having achieved remission: Secondary | ICD-10-CM

## 2021-09-07 DIAGNOSIS — D649 Anemia, unspecified: Secondary | ICD-10-CM | POA: Diagnosis not present

## 2021-09-07 DIAGNOSIS — Z5112 Encounter for antineoplastic immunotherapy: Secondary | ICD-10-CM | POA: Diagnosis not present

## 2021-09-07 MED ORDER — SODIUM CHLORIDE 0.9% IV SOLUTION
250.0000 mL | Freq: Once | INTRAVENOUS | Status: AC
  Administered 2021-09-07: 250 mL via INTRAVENOUS

## 2021-09-07 MED ORDER — BORTEZOMIB CHEMO SQ INJECTION 3.5 MG (2.5MG/ML)
1.3000 mg/m2 | Freq: Once | INTRAMUSCULAR | Status: AC
  Administered 2021-09-07: 2.25 mg via SUBCUTANEOUS
  Filled 2021-09-07: qty 0.9

## 2021-09-07 MED ORDER — DIPHENHYDRAMINE HCL 25 MG PO CAPS
25.0000 mg | ORAL_CAPSULE | Freq: Once | ORAL | Status: AC
  Administered 2021-09-07: 25 mg via ORAL
  Filled 2021-09-07: qty 1

## 2021-09-07 MED ORDER — ACETAMINOPHEN 325 MG PO TABS
650.0000 mg | ORAL_TABLET | Freq: Once | ORAL | Status: AC
  Administered 2021-09-07: 650 mg via ORAL
  Filled 2021-09-07: qty 2

## 2021-09-07 NOTE — Patient Instructions (Signed)
[name]  Discharge Instructions: ?Thank you for choosing [facility] to provide your oncology and hematology care.  ?If you have a lab appointment with the [facility], please come in thru the Main Entrance and check in at the main information desk. ? ?Wear comfortable clothing and clothing appropriate for easy access to any Portacath or PICC line.  ? ?We strive to give you quality time with your provider. You may need to reschedule your appointment if you arrive late (15 or more minutes).  Arriving late affects you and other patients whose appointments are after yours.  Also, if you miss three or more appointments without notifying the office, you may be dismissed from the clinic at the provider?s discretion.    ?  ?For prescription refill requests, have your pharmacy contact our office and allow 72 hours for refills to be completed.   ? ?Today you received the following chemotherapy and/or immunotherapy agents Velcade.   ?  ?To help prevent nausea and vomiting after your treatment, we encourage you to take your nausea medication as directed. ? ?BELOW ARE SYMPTOMS THAT SHOULD BE REPORTED IMMEDIATELY: ?*FEVER GREATER THAN 100.4 F (38 ?C) OR HIGHER ?*CHILLS OR SWEATING ?*NAUSEA AND VOMITING THAT IS NOT CONTROLLED WITH YOUR NAUSEA MEDICATION ?*UNUSUAL SHORTNESS OF BREATH ?*UNUSUAL BRUISING OR BLEEDING ?*URINARY PROBLEMS (pain or burning when urinating, or frequent urination) ?*BOWEL PROBLEMS (unusual diarrhea, constipation, pain near the anus) ?TENDERNESS IN MOUTH AND THROAT WITH OR WITHOUT PRESENCE OF ULCERS (sore throat, sores in mouth, or a toothache) ?UNUSUAL RASH, SWELLING OR PAIN  ?UNUSUAL VAGINAL DISCHARGE OR ITCHING  ? ?Items with * indicate a potential emergency and should be followed up as soon as possible or go to the Emergency Department if any problems should occur. ? ?Please show the CHEMOTHERAPY ALERT CARD or IMMUNOTHERAPY ALERT CARD at check-in to the Emergency  Department and triage nurse. ? ?Should you have questions after your visit or need to cancel or reschedule your appointment, please contact [facility] [phone]  and follow the prompts.  Office hours are 8:00 a.m. to 4:30 p.m. Monday - Friday. Please note that voicemails left after 4:00 p.m. may not be returned until the following business day.  We are closed weekends and major holidays. You have access to a nurse at all times for urgent questions. Please call the main number to the clinic [phone] and follow the prompts. ? ?For any non-urgent questions, you may also contact your provider using MyChart. We now offer e-Visits for anyone 61 and older to request care online for non-urgent symptoms. For details visit [email or web address]. ?  ?Also download the MyChart app! Go to the app store, search "MyChart", open the app, select [facility], and log in with your MyChart username and password. ? ?Due to Covid, a mask is required upon entering the hospital/clinic. If you do not have a mask, one will be given to you upon arrival. For doctor visits, patients may have 1 support person aged 59 or older with them. For treatment visits, patients cannot have anyone with them due to current Covid guidelines and our immunocompromised population.  ?

## 2021-09-07 NOTE — Progress Notes (Signed)
Patient tolerated transfusion with no complaints voiced.  Side effects with management reviewed with understanding verbalized.  Port site clean and dry with no bruising or swelling noted at site.  Good blood return noted before and after administration.  Band aid applied.  ? ?Patient tolerated Velcade injection with no complaints voiced.  Lab work reviewed.  See MAR for details.  Injection site clean and dry with no bruising or swelling noted.  Patient stable during and after injection.  Band aid applied.  VSS.  Patient left in satisfactory condition with no s/s of distress noted.    ?

## 2021-09-09 ENCOUNTER — Other Ambulatory Visit: Payer: Self-pay | Admitting: Cardiovascular Disease

## 2021-09-10 LAB — TYPE AND SCREEN
ABO/RH(D): O POS
Antibody Screen: NEGATIVE
Unit division: 0

## 2021-09-10 LAB — BPAM RBC
Blood Product Expiration Date: 202306062359
ISSUE DATE / TIME: 202305050850
Unit Type and Rh: 5100

## 2021-09-13 ENCOUNTER — Inpatient Hospital Stay (HOSPITAL_COMMUNITY): Payer: Medicare Other

## 2021-09-13 VITALS — BP 142/75 | HR 71 | Temp 98.0°F | Resp 18

## 2021-09-13 DIAGNOSIS — D649 Anemia, unspecified: Secondary | ICD-10-CM | POA: Diagnosis not present

## 2021-09-13 DIAGNOSIS — Z5112 Encounter for antineoplastic immunotherapy: Secondary | ICD-10-CM | POA: Diagnosis not present

## 2021-09-13 DIAGNOSIS — C9000 Multiple myeloma not having achieved remission: Secondary | ICD-10-CM

## 2021-09-13 DIAGNOSIS — Z79899 Other long term (current) drug therapy: Secondary | ICD-10-CM | POA: Diagnosis not present

## 2021-09-13 DIAGNOSIS — D696 Thrombocytopenia, unspecified: Secondary | ICD-10-CM

## 2021-09-13 LAB — CBC WITH DIFFERENTIAL/PLATELET
Abs Immature Granulocytes: 0 10*3/uL (ref 0.00–0.07)
Basophils Absolute: 0 10*3/uL (ref 0.0–0.1)
Basophils Relative: 0 %
Eosinophils Absolute: 0.1 10*3/uL (ref 0.0–0.5)
Eosinophils Relative: 2 %
HCT: 23.6 % — ABNORMAL LOW (ref 39.0–52.0)
Hemoglobin: 7.8 g/dL — ABNORMAL LOW (ref 13.0–17.0)
Immature Granulocytes: 0 %
Lymphocytes Relative: 28 %
Lymphs Abs: 0.8 10*3/uL (ref 0.7–4.0)
MCH: 33.9 pg (ref 26.0–34.0)
MCHC: 33.1 g/dL (ref 30.0–36.0)
MCV: 102.6 fL — ABNORMAL HIGH (ref 80.0–100.0)
Monocytes Absolute: 0.6 10*3/uL (ref 0.1–1.0)
Monocytes Relative: 21 %
Neutro Abs: 1.4 10*3/uL — ABNORMAL LOW (ref 1.7–7.7)
Neutrophils Relative %: 49 %
Platelets: 67 10*3/uL — ABNORMAL LOW (ref 150–400)
RBC: 2.3 MIL/uL — ABNORMAL LOW (ref 4.22–5.81)
RDW: 22.4 % — ABNORMAL HIGH (ref 11.5–15.5)
WBC: 2.9 10*3/uL — ABNORMAL LOW (ref 4.0–10.5)
nRBC: 0 % (ref 0.0–0.2)

## 2021-09-13 LAB — COMPREHENSIVE METABOLIC PANEL
ALT: 15 U/L (ref 0–44)
AST: 15 U/L (ref 15–41)
Albumin: 2.9 g/dL — ABNORMAL LOW (ref 3.5–5.0)
Alkaline Phosphatase: 56 U/L (ref 38–126)
Anion gap: 4 — ABNORMAL LOW (ref 5–15)
BUN: 20 mg/dL (ref 8–23)
CO2: 25 mmol/L (ref 22–32)
Calcium: 8.6 mg/dL — ABNORMAL LOW (ref 8.9–10.3)
Chloride: 106 mmol/L (ref 98–111)
Creatinine, Ser: 0.76 mg/dL (ref 0.61–1.24)
GFR, Estimated: >60 mL/min (ref >60)
Glucose, Bld: 104 mg/dL — ABNORMAL HIGH (ref 70–99)
Potassium: 3.8 mmol/L (ref 3.5–5.1)
Sodium: 135 mmol/L (ref 135–145)
Total Bilirubin: 0.7 mg/dL (ref 0.3–1.2)
Total Protein: 8.1 g/dL (ref 6.5–8.1)

## 2021-09-13 LAB — MAGNESIUM: Magnesium: 1.9 mg/dL (ref 1.7–2.4)

## 2021-09-13 LAB — SAMPLE TO BLOOD BANK

## 2021-09-13 MED ORDER — BORTEZOMIB CHEMO SQ INJECTION 3.5 MG (2.5MG/ML)
1.3000 mg/m2 | Freq: Once | INTRAMUSCULAR | Status: AC
  Administered 2021-09-13: 2.25 mg via SUBCUTANEOUS
  Filled 2021-09-13: qty 0.9

## 2021-09-13 NOTE — Patient Instructions (Signed)
[facility]  Discharge Instructions: Thank you for choosing [facility] to provide your oncology and hematology care.  If you have a lab appointment with the Cancer Center, please come in thru the Main Entrance and check in at the main information desk.  Wear comfortable clothing and clothing appropriate for easy access to any Portacath or PICC line.   We strive to give you quality time with your provider. You may need to reschedule your appointment if you arrive late (15 or more minutes).  Arriving late affects you and other patients whose appointments are after yours.  Also, if you miss three or more appointments without notifying the office, you may be dismissed from the clinic at the provider's discretion.      For prescription refill requests, have your pharmacy contact our office and allow 72 hours for refills to be completed.        To help prevent nausea and vomiting after your treatment, we encourage you to take your nausea medication as directed.  BELOW ARE SYMPTOMS THAT SHOULD BE REPORTED IMMEDIATELY: *FEVER GREATER THAN 100.4 F (38 C) OR HIGHER *CHILLS OR SWEATING *NAUSEA AND VOMITING THAT IS NOT CONTROLLED WITH YOUR NAUSEA MEDICATION *UNUSUAL SHORTNESS OF BREATH *UNUSUAL BRUISING OR BLEEDING *URINARY PROBLEMS (pain or burning when urinating, or frequent urination) *BOWEL PROBLEMS (unusual diarrhea, constipation, pain near the anus) TENDERNESS IN MOUTH AND THROAT WITH OR WITHOUT PRESENCE OF ULCERS (sore throat, sores in mouth, or a toothache) UNUSUAL RASH, SWELLING OR PAIN  UNUSUAL VAGINAL DISCHARGE OR ITCHING   Items with * indicate a potential emergency and should be followed up as soon as possible or go to the Emergency Department if any problems should occur.  Please show the CHEMOTHERAPY ALERT CARD or IMMUNOTHERAPY ALERT CARD at check-in to the Emergency Department and triage nurse.  Should you have questions after your visit or need to cancel  or reschedule your appointment, please contact [facility] [phone]  and follow the prompts.  Office hours are 8:00 a.m. to 4:30 p.m. Monday - Friday. Please note that voicemails left after 4:00 p.m. may not be returned until the following business day.  We are closed weekends and major holidays. You have access to a nurse at all times for urgent questions. Please call the main number to the clinic [phone] and follow the prompts.  For any non-urgent questions, you may also contact your provider using MyChart. We now offer e-Visits for anyone 18 and older to request care online for non-urgent symptoms. For details visit [email or web address].   Also download the MyChart app! Go to the app store, search "MyChart", open the app, select [facility], and log in with your MyChart username and password.  Due to Covid, a mask is required upon entering the hospital/clinic. If you do not have a mask, one will be given to you upon arrival. For doctor visits, patients may have 1 support person aged 18 or older with them. For treatment visits, patients cannot have anyone with them due to current Covid guidelines and our immunocompromised population.  

## 2021-09-13 NOTE — Progress Notes (Signed)
Patient presents today for Velcade injection.  Patient is in satisfactory condition with no new complaints voiced.  Vital signs are stable.  Labs reviewed.  Platelets today are 67 and ANC is 1.4.  Dr. Alvy Bimler notified.  We will proceed with treatment per MD orders.  Patient educated on risks of bleeding and infection. ? ?Patient tolerated treatment well with no complaints voiced.  Patient left ambulatory in stable condition.  Vital signs stable at discharge.  Follow up as scheduled.      ?

## 2021-09-20 ENCOUNTER — Inpatient Hospital Stay (HOSPITAL_COMMUNITY): Payer: Medicare Other

## 2021-09-20 ENCOUNTER — Encounter (HOSPITAL_COMMUNITY): Payer: Self-pay

## 2021-09-20 VITALS — BP 151/59 | HR 67 | Temp 97.6°F | Resp 18

## 2021-09-20 DIAGNOSIS — Z79899 Other long term (current) drug therapy: Secondary | ICD-10-CM | POA: Diagnosis not present

## 2021-09-20 DIAGNOSIS — D696 Thrombocytopenia, unspecified: Secondary | ICD-10-CM

## 2021-09-20 DIAGNOSIS — Z5112 Encounter for antineoplastic immunotherapy: Secondary | ICD-10-CM | POA: Diagnosis not present

## 2021-09-20 DIAGNOSIS — C9000 Multiple myeloma not having achieved remission: Secondary | ICD-10-CM

## 2021-09-20 DIAGNOSIS — D649 Anemia, unspecified: Secondary | ICD-10-CM | POA: Diagnosis not present

## 2021-09-20 LAB — COMPREHENSIVE METABOLIC PANEL
ALT: 17 U/L (ref 0–44)
AST: 17 U/L (ref 15–41)
Albumin: 3 g/dL — ABNORMAL LOW (ref 3.5–5.0)
Alkaline Phosphatase: 60 U/L (ref 38–126)
Anion gap: 4 — ABNORMAL LOW (ref 5–15)
BUN: 22 mg/dL (ref 8–23)
CO2: 25 mmol/L (ref 22–32)
Calcium: 8.7 mg/dL — ABNORMAL LOW (ref 8.9–10.3)
Chloride: 105 mmol/L (ref 98–111)
Creatinine, Ser: 0.82 mg/dL (ref 0.61–1.24)
GFR, Estimated: >60 mL/min (ref >60)
Glucose, Bld: 109 mg/dL — ABNORMAL HIGH (ref 70–99)
Potassium: 4 mmol/L (ref 3.5–5.1)
Sodium: 134 mmol/L — ABNORMAL LOW (ref 135–145)
Total Bilirubin: 0.7 mg/dL (ref 0.3–1.2)
Total Protein: 7.9 g/dL (ref 6.5–8.1)

## 2021-09-20 LAB — CBC WITH DIFFERENTIAL/PLATELET
Abs Immature Granulocytes: 0.01 10*3/uL (ref 0.00–0.07)
Basophils Absolute: 0 10*3/uL (ref 0.0–0.1)
Basophils Relative: 0 %
Eosinophils Absolute: 0.1 10*3/uL (ref 0.0–0.5)
Eosinophils Relative: 2 %
HCT: 23.3 % — ABNORMAL LOW (ref 39.0–52.0)
Hemoglobin: 7.7 g/dL — ABNORMAL LOW (ref 13.0–17.0)
Immature Granulocytes: 0 %
Lymphocytes Relative: 31 %
Lymphs Abs: 1 10*3/uL (ref 0.7–4.0)
MCH: 34.2 pg — ABNORMAL HIGH (ref 26.0–34.0)
MCHC: 33 g/dL (ref 30.0–36.0)
MCV: 103.6 fL — ABNORMAL HIGH (ref 80.0–100.0)
Monocytes Absolute: 0.7 10*3/uL (ref 0.1–1.0)
Monocytes Relative: 20 %
Neutro Abs: 1.6 10*3/uL — ABNORMAL LOW (ref 1.7–7.7)
Neutrophils Relative %: 47 %
Platelets: 56 10*3/uL — ABNORMAL LOW (ref 150–400)
RBC: 2.25 MIL/uL — ABNORMAL LOW (ref 4.22–5.81)
RDW: 22.7 % — ABNORMAL HIGH (ref 11.5–15.5)
WBC: 3.3 10*3/uL — ABNORMAL LOW (ref 4.0–10.5)
nRBC: 0 % (ref 0.0–0.2)

## 2021-09-20 LAB — SAMPLE TO BLOOD BANK

## 2021-09-20 LAB — MAGNESIUM: Magnesium: 2 mg/dL (ref 1.7–2.4)

## 2021-09-20 MED ORDER — BORTEZOMIB CHEMO SQ INJECTION 3.5 MG (2.5MG/ML)
1.3000 mg/m2 | Freq: Once | INTRAMUSCULAR | Status: AC
  Administered 2021-09-20: 2.25 mg via SUBCUTANEOUS
  Filled 2021-09-20: qty 0.9

## 2021-09-20 NOTE — Patient Instructions (Signed)
[facility]  Discharge Instructions: Thank you for choosing [facility] to provide your oncology and hematology care.  If you have a lab appointment with the [facility], please come in thru the Main Entrance and check in at the main information desk.  Wear comfortable clothing and clothing appropriate for easy access to any Portacath or PICC line.   We strive to give you quality time with your provider. You may need to reschedule your appointment if you arrive late (15 or more minutes).  Arriving late affects you and other patients whose appointments are after yours.  Also, if you miss three or more appointments without notifying the office, you may be dismissed from the clinic at the provider's discretion.      For prescription refill requests, have your pharmacy contact our office and allow 72 hours for refills to be completed.    Today you received the following chemotherapy and/or immunotherapy agents Velcade, return as scheduled.   To help prevent nausea and vomiting after your treatment, we encourage you to take your nausea medication as directed.  BELOW ARE SYMPTOMS THAT SHOULD BE REPORTED IMMEDIATELY: *FEVER GREATER THAN 100.4 F (38 C) OR HIGHER *CHILLS OR SWEATING *NAUSEA AND VOMITING THAT IS NOT CONTROLLED WITH YOUR NAUSEA MEDICATION *UNUSUAL SHORTNESS OF BREATH *UNUSUAL BRUISING OR BLEEDING *URINARY PROBLEMS (pain or burning when urinating, or frequent urination) *BOWEL PROBLEMS (unusual diarrhea, constipation, pain near the anus) TENDERNESS IN MOUTH AND THROAT WITH OR WITHOUT PRESENCE OF ULCERS (sore throat, sores in mouth, or a toothache) UNUSUAL RASH, SWELLING OR PAIN  UNUSUAL VAGINAL DISCHARGE OR ITCHING   Items with * indicate a potential emergency and should be followed up as soon as possible or go to the Emergency Department if any problems should occur.  Please show the CHEMOTHERAPY ALERT CARD or IMMUNOTHERAPY ALERT CARD at check-in to the  Emergency Department and triage nurse.  Should you have questions after your visit or need to cancel or reschedule your appointment, please contact [facility] [phone]  and follow the prompts.  Office hours are 8:00 a.m. to 4:30 p.m. Monday - Friday. Please note that voicemails left after 4:00 p.m. may not be returned until the following business day.  We are closed weekends and major holidays. You have access to a nurse at all times for urgent questions. Please call the main number to the clinic [phone] and follow the prompts.  For any non-urgent questions, you may also contact your provider using MyChart. We now offer e-Visits for anyone 9 and older to request care online for non-urgent symptoms. For details visit [email or web address].   Also download the MyChart app! Go to the app store, search "MyChart", open the app, select [facility], and log in with your MyChart username and password.  Due to Covid, a mask is required upon entering the hospital/clinic. If you do not have a mask, one will be given to you upon arrival. For doctor visits, patients may have 1 support person aged 46 or older with them. For treatment visits, patients cannot have anyone with them due to current Covid guidelines and our immunocompromised population.

## 2021-09-20 NOTE — Progress Notes (Signed)
Patient presents today for Velcade, platelets are 56, Dr. Delton Coombes made aware. Okay to treat per Dr. Delton Coombes, pharmacy aware.  Patient tolerated Velcade injection with no complaints voiced. Lab work reviewed. See MAR for details. Injection site clean and dry with no bruising or swelling noted. Patient stable during and after injection. Band aid applied. VSS. Patient left in satisfactory condition with no s/s of distress noted.

## 2021-09-21 ENCOUNTER — Other Ambulatory Visit: Payer: Self-pay | Admitting: Cardiovascular Disease

## 2021-09-27 ENCOUNTER — Inpatient Hospital Stay (HOSPITAL_COMMUNITY): Payer: Medicare Other

## 2021-09-27 ENCOUNTER — Other Ambulatory Visit (HOSPITAL_COMMUNITY): Payer: Self-pay | Admitting: Hematology

## 2021-09-27 VITALS — BP 124/50 | HR 65 | Temp 97.5°F | Resp 18 | Wt 126.7 lb

## 2021-09-27 DIAGNOSIS — Z79899 Other long term (current) drug therapy: Secondary | ICD-10-CM | POA: Diagnosis not present

## 2021-09-27 DIAGNOSIS — C9000 Multiple myeloma not having achieved remission: Secondary | ICD-10-CM

## 2021-09-27 DIAGNOSIS — D649 Anemia, unspecified: Secondary | ICD-10-CM | POA: Diagnosis not present

## 2021-09-27 DIAGNOSIS — Z5112 Encounter for antineoplastic immunotherapy: Secondary | ICD-10-CM | POA: Diagnosis not present

## 2021-09-27 LAB — CBC WITH DIFFERENTIAL/PLATELET
Abs Immature Granulocytes: 0.01 10*3/uL (ref 0.00–0.07)
Basophils Absolute: 0 10*3/uL (ref 0.0–0.1)
Basophils Relative: 0 %
Eosinophils Absolute: 0.1 10*3/uL (ref 0.0–0.5)
Eosinophils Relative: 3 %
HCT: 20.7 % — ABNORMAL LOW (ref 39.0–52.0)
Hemoglobin: 6.7 g/dL — CL (ref 13.0–17.0)
Immature Granulocytes: 0 %
Lymphocytes Relative: 30 %
Lymphs Abs: 1.1 10*3/uL (ref 0.7–4.0)
MCH: 34.4 pg — ABNORMAL HIGH (ref 26.0–34.0)
MCHC: 32.4 g/dL (ref 30.0–36.0)
MCV: 106.2 fL — ABNORMAL HIGH (ref 80.0–100.0)
Monocytes Absolute: 0.7 10*3/uL (ref 0.1–1.0)
Monocytes Relative: 19 %
Neutro Abs: 1.7 10*3/uL (ref 1.7–7.7)
Neutrophils Relative %: 48 %
Platelets: 68 10*3/uL — ABNORMAL LOW (ref 150–400)
RBC: 1.95 MIL/uL — ABNORMAL LOW (ref 4.22–5.81)
RDW: 23.8 % — ABNORMAL HIGH (ref 11.5–15.5)
WBC: 3.6 10*3/uL — ABNORMAL LOW (ref 4.0–10.5)
nRBC: 0 % (ref 0.0–0.2)

## 2021-09-27 LAB — COMPREHENSIVE METABOLIC PANEL
ALT: 16 U/L (ref 0–44)
AST: 15 U/L (ref 15–41)
Albumin: 2.9 g/dL — ABNORMAL LOW (ref 3.5–5.0)
Alkaline Phosphatase: 55 U/L (ref 38–126)
Anion gap: <3 — ABNORMAL LOW (ref 5–15)
BUN: 30 mg/dL — ABNORMAL HIGH (ref 8–23)
CO2: 25 mmol/L (ref 22–32)
Calcium: 8.6 mg/dL — ABNORMAL LOW (ref 8.9–10.3)
Chloride: 106 mmol/L (ref 98–111)
Creatinine, Ser: 0.98 mg/dL (ref 0.61–1.24)
GFR, Estimated: >60 mL/min (ref >60)
Glucose, Bld: 98 mg/dL (ref 70–99)
Potassium: 4.1 mmol/L (ref 3.5–5.1)
Sodium: 133 mmol/L — ABNORMAL LOW (ref 135–145)
Total Bilirubin: 0.5 mg/dL (ref 0.3–1.2)
Total Protein: 7.6 g/dL (ref 6.5–8.1)

## 2021-09-27 LAB — SAMPLE TO BLOOD BANK

## 2021-09-27 LAB — PREPARE RBC (CROSSMATCH)

## 2021-09-27 LAB — MAGNESIUM: Magnesium: 2.1 mg/dL (ref 1.7–2.4)

## 2021-09-27 MED ORDER — BORTEZOMIB CHEMO SQ INJECTION 3.5 MG (2.5MG/ML)
1.3000 mg/m2 | Freq: Once | INTRAMUSCULAR | Status: AC
  Administered 2021-09-27: 2.25 mg via SUBCUTANEOUS
  Filled 2021-09-27: qty 0.9

## 2021-09-27 NOTE — Patient Instructions (Signed)
[facility]  Discharge Instructions: Thank you for choosing [facility] to provide your oncology and hematology care.  If you have a lab appointment with the [facility], please come in thru the Main Entrance and check in at the main information desk.  Wear comfortable clothing and clothing appropriate for easy access to any Portacath or PICC line.   We strive to give you quality time with your provider. You may need to reschedule your appointment if you arrive late (15 or more minutes).  Arriving late affects you and other patients whose appointments are after yours.  Also, if you miss three or more appointments without notifying the office, you may be dismissed from the clinic at the provider's discretion.      For prescription refill requests, have your pharmacy contact our office and allow 72 hours for refills to be completed.    Today you received the following chemotherapy and/or immunotherapy agents Velcade injection.   To help prevent nausea and vomiting after your treatment, we encourage you to take your nausea medication as directed.  BELOW ARE SYMPTOMS THAT SHOULD BE REPORTED IMMEDIATELY: *FEVER GREATER THAN 100.4 F (38 C) OR HIGHER *CHILLS OR SWEATING *NAUSEA AND VOMITING THAT IS NOT CONTROLLED WITH YOUR NAUSEA MEDICATION *UNUSUAL SHORTNESS OF BREATH *UNUSUAL BRUISING OR BLEEDING *URINARY PROBLEMS (pain or burning when urinating, or frequent urination) *BOWEL PROBLEMS (unusual diarrhea, constipation, pain near the anus) TENDERNESS IN MOUTH AND THROAT WITH OR WITHOUT PRESENCE OF ULCERS (sore throat, sores in mouth, or a toothache) UNUSUAL RASH, SWELLING OR PAIN  UNUSUAL VAGINAL DISCHARGE OR ITCHING   Items with * indicate a potential emergency and should be followed up as soon as possible or go to the Emergency Department if any problems should occur.  Please show the CHEMOTHERAPY ALERT CARD or IMMUNOTHERAPY ALERT CARD at check-in to the Emergency  Department and triage nurse.  Should you have questions after your visit or need to cancel or reschedule your appointment, please contact [facility] [phone]  and follow the prompts.  Office hours are 8:00 a.m. to 4:30 p.m. Monday - Friday. Please note that voicemails left after 4:00 p.m. may not be returned until the following business day.  We are closed weekends and major holidays. You have access to a nurse at all times for urgent questions. Please call the main number to the clinic [phone] and follow the prompts.  For any non-urgent questions, you may also contact your provider using MyChart. We now offer e-Visits for anyone 60 and older to request care online for non-urgent symptoms. For details visit [email or web address].   Also download the MyChart app! Go to the app store, search "MyChart", open the app, select [facility], and log in with your MyChart username and password.  Due to Covid, a mask is required upon entering the hospital/clinic. If you do not have a mask, one will be given to you upon arrival. For doctor visits, patients may have 1 support person aged 3 or older with them. For treatment visits, patients cannot have anyone with them due to current Covid guidelines and our immunocompromised population.

## 2021-09-27 NOTE — Progress Notes (Signed)
CRITICAL VALUE ALERT Critical value received:  HGB 6.7. Date of notification:  09-27-2021 Time of notification: 14:43 pm  Critical value read back:  Yes.   Nurse who received alert:  B. Clemente Dewey Rn MD notified time and response:  Katrag[place]da.

## 2021-09-27 NOTE — Progress Notes (Signed)
Pt presents today for Velcade injection per provider's order. Vital signs and other labs WNL for treatment. Pt's hemoglobin is 6.7 today. Dr. Raliegh Ip made aware. Okay to proceed with treatment today. Pt will return tomorrow to receive 1 unit of blood per Dr.K.  Velcade injection given today per MD orders. Tolerated infusion without adverse affects. Vital signs stable. No complaints at this time. Discharged from clinic ambulatory in stable condition. Alert and oriented x 3. F/U with [facility] as scheduled.

## 2021-09-28 ENCOUNTER — Encounter (HOSPITAL_COMMUNITY): Payer: Self-pay

## 2021-09-28 ENCOUNTER — Other Ambulatory Visit (HOSPITAL_COMMUNITY): Payer: Self-pay

## 2021-09-28 ENCOUNTER — Inpatient Hospital Stay (HOSPITAL_COMMUNITY): Payer: Medicare Other

## 2021-09-28 ENCOUNTER — Encounter (HOSPITAL_COMMUNITY): Payer: Self-pay | Admitting: Hematology

## 2021-09-28 DIAGNOSIS — Z79899 Other long term (current) drug therapy: Secondary | ICD-10-CM | POA: Diagnosis not present

## 2021-09-28 DIAGNOSIS — C9000 Multiple myeloma not having achieved remission: Secondary | ICD-10-CM

## 2021-09-28 DIAGNOSIS — D649 Anemia, unspecified: Secondary | ICD-10-CM | POA: Diagnosis not present

## 2021-09-28 DIAGNOSIS — Z5112 Encounter for antineoplastic immunotherapy: Secondary | ICD-10-CM | POA: Diagnosis not present

## 2021-09-28 LAB — KAPPA/LAMBDA LIGHT CHAINS
Kappa free light chain: 8.9 mg/L (ref 3.3–19.4)
Kappa, lambda light chain ratio: 0.01 — ABNORMAL LOW (ref 0.26–1.65)
Lambda free light chains: 619.8 mg/L — ABNORMAL HIGH (ref 5.7–26.3)

## 2021-09-28 MED ORDER — SODIUM CHLORIDE 0.9% IV SOLUTION
250.0000 mL | Freq: Once | INTRAVENOUS | Status: AC
  Administered 2021-09-28: 250 mL via INTRAVENOUS

## 2021-09-28 MED ORDER — ACETAMINOPHEN 325 MG PO TABS
650.0000 mg | ORAL_TABLET | Freq: Once | ORAL | Status: AC
  Administered 2021-09-28: 650 mg via ORAL
  Filled 2021-09-28: qty 2

## 2021-09-28 MED ORDER — DIPHENHYDRAMINE HCL 25 MG PO CAPS
25.0000 mg | ORAL_CAPSULE | Freq: Once | ORAL | Status: AC
  Administered 2021-09-28: 25 mg via ORAL
  Filled 2021-09-28: qty 1

## 2021-09-28 NOTE — Progress Notes (Signed)
Patient presents today for 1 unit of PRBC's.  Vitals signs stable. Patient has no complaints of any changes since his last visit. MAR reviewed and updated. HGB 6.7 on 09/27/2021. Patient taking Velcade injections.   Vital signs stable. No complaints at this time. Discharged from clinic ambulatory in stable condition. Alert and oriented x 3. F/U with [facility] as scheduled.

## 2021-09-28 NOTE — Patient Instructions (Signed)
[facility]  Discharge Instructions: Thank you for choosing [facility] to provide your oncology and hematology care.  If you have a lab appointment with the [facility], please come in thru the Main Entrance and check in at the main information desk.  Wear comfortable clothing and clothing appropriate for easy access to any Portacath or PICC line.   We strive to give you quality time with your provider. You may need to reschedule your appointment if you arrive late (15 or more minutes).  Arriving late affects you and other patients whose appointments are after yours.  Also, if you miss three or more appointments without notifying the office, you may be dismissed from the clinic at the provider's discretion.      For prescription refill requests, have your pharmacy contact our office and allow 72 hours for refills to be completed.    Today you received the following chemotherapy and/or immunotherapy agents 1 Unit of blood.      To help prevent nausea and vomiting after your treatment, we encourage you to take your nausea medication as directed.  BELOW ARE SYMPTOMS THAT SHOULD BE REPORTED IMMEDIATELY: *FEVER GREATER THAN 100.4 F (38 C) OR HIGHER *CHILLS OR SWEATING *NAUSEA AND VOMITING THAT IS NOT CONTROLLED WITH YOUR NAUSEA MEDICATION *UNUSUAL SHORTNESS OF BREATH *UNUSUAL BRUISING OR BLEEDING *URINARY PROBLEMS (pain or burning when urinating, or frequent urination) *BOWEL PROBLEMS (unusual diarrhea, constipation, pain near the anus) TENDERNESS IN MOUTH AND THROAT WITH OR WITHOUT PRESENCE OF ULCERS (sore throat, sores in mouth, or a toothache) UNUSUAL RASH, SWELLING OR PAIN  UNUSUAL VAGINAL DISCHARGE OR ITCHING   Items with * indicate a potential emergency and should be followed up as soon as possible or go to the Emergency Department if any problems should occur.  Please show the CHEMOTHERAPY ALERT CARD or IMMUNOTHERAPY ALERT CARD at check-in to the Emergency  Department and triage nurse.  Should you have questions after your visit or need to cancel or reschedule your appointment, please contact [facility] [phone]  and follow the prompts.  Office hours are 8:00 a.m. to 4:30 p.m. Monday - Friday. Please note that voicemails left after 4:00 p.m. may not be returned until the following business day.  We are closed weekends and major holidays. You have access to a nurse at all times for urgent questions. Please call the main number to the clinic [phone] and follow the prompts.  For any non-urgent questions, you may also contact your provider using MyChart. We now offer e-Visits for anyone 86 and older to request care online for non-urgent symptoms. For details visit [email or web address].   Also download the MyChart app! Go to the app store, search "MyChart", open the app, select [facility], and log in with your MyChart username and password.  Due to Covid, a mask is required upon entering the hospital/clinic. If you do not have a mask, one will be given to you upon arrival. For doctor visits, patients may have 1 support person aged 86 or older with them. For treatment visits, patients cannot have anyone with them due to current Covid guidelines and our immunocompromised population.

## 2021-09-29 LAB — TYPE AND SCREEN
ABO/RH(D): O POS
Antibody Screen: NEGATIVE
Unit division: 0

## 2021-09-29 LAB — BPAM RBC
Blood Product Expiration Date: 202307012359
ISSUE DATE / TIME: 202305261026
Unit Type and Rh: 5100

## 2021-10-02 LAB — PROTEIN ELECTROPHORESIS, SERUM
A/G Ratio: 0.8 (ref 0.7–1.7)
Albumin ELP: 3.4 g/dL (ref 2.9–4.4)
Alpha-1-Globulin: 0.2 g/dL (ref 0.0–0.4)
Alpha-2-Globulin: 0.6 g/dL (ref 0.4–1.0)
Beta Globulin: 0.6 g/dL — ABNORMAL LOW (ref 0.7–1.3)
Gamma Globulin: 2.9 g/dL — ABNORMAL HIGH (ref 0.4–1.8)
Globulin, Total: 4.3 g/dL — ABNORMAL HIGH (ref 2.2–3.9)
M-Spike, %: 2.5 g/dL — ABNORMAL HIGH
Total Protein ELP: 7.7 g/dL (ref 6.0–8.5)

## 2021-10-02 NOTE — Progress Notes (Signed)
[address] [place], [place] [address]   CLINIC:  Medical Oncology/Hematology  PCP:  [facility], [place] / [place] [address] [phone]   REASON FOR VISIT:  Follow-up for multiple myeloma and anemia  PRIOR THERAPY: none  CURRENT THERAPY: VRd weekly q21d  BRIEF ONCOLOGIC HISTORY:  Oncology History  Multiple myeloma ([facility])  01/15/2021 Initial Diagnosis   Multiple myeloma ([facility])    06/25/2021 -  Chemotherapy   Patient is on Treatment Plan : MYELOMA NON-TRANSPLANT CANDIDATES VRd weekly q21d        CANCER STAGING:  Cancer Staging  No matching staging information was found for the patient.  INTERVAL HISTORY:  Mr. SCHNEIDER WARCHOL, a 88 y.o. male, returns for routine follow-up and consideration for next cycle of chemotherapy. Chandra was last seen on 08/30/2021.  Due for day #8 cycle #5 of VRd today.   Overall, he tells me he has been feeling pretty well. ***  Overall, he feels ready for next cycle of chemo today.    REVIEW OF SYSTEMS:  Review of Systems  All other systems reviewed and are negative.  PAST MEDICAL/SURGICAL HISTORY:  Past Medical History:  Diagnosis Date   Allergic rhinitis    Anal fissure    Anginal pain (HCC)    Asthma    Cancer (HCC)    skin   Chest pain    CHF (congestive heart failure) ([facility])    Coronary heart disease    s/p stenting. cath in 01/2012 noncritical occlusion   Dysrhythmia    1st degree heart block   GERD (gastroesophageal reflux disease)    Glaucoma    Hiatal hernia    Hyperlipidemia    Hypertension    Hypothyroidism    Idiopathic thrombocytopenic purpura ([facility]) 2002   Macular degeneration    Nephrolithiasis    PUD (peptic ulcer disease)    remote   Sarcoidosis    pulmonary   Schatzki's ring    Past Surgical History:  Procedure Laterality Date   cardiac stents     COLONOSCOPY  10/30/2006   Normal rectum, sigmoid diverticula.Remainder of colonic mucosa appeared  normal.   CORONARY ANGIOPLASTY WITH STENT PLACEMENT     about 10 years ago per pt (around 2007)   [place], URETEROSCOPY AND STENT PLACEMENT Left 06/16/2017   Procedure: CYSTOSCOPY WITH RETROGRADE PYELOGRAM, URETEROSCOPY,STONE EXTRACTION  AND STENT PLACEMENT;  Surgeon: Franchot Gallo, MD;  Location: [facility];  Service: Urology;  Laterality: Left;   ESOPHAGOGASTRODUODENOSCOPY  06/19/2004   Two esophageal rings and esophageal web as described above.  All of these were disrupted by passing 56-French Venia Minks dilator/ Candida esophagitis,which appears to be incidental given history of   antibiotic use, but nevertheless will be treated.   ESOPHAGOGASTRODUODENOSCOPY  10/30/2006   Distal tandem esophageal ring status post dilation disruption as  described above.  Otherwise normal esophagus/  Small hiatal hernia otherwise normal stomach, D1 and D2   ESOPHAGOGASTRODUODENOSCOPY N/A 03/22/2015   Dr.Rourk- noncritical schatzki's ring and hiatal hernia-o/w normal EGD.    ESOPHAGOGASTRODUODENOSCOPY (EGD) WITH ESOPHAGEAL DILATION  03/04/2012   RMR- schatzki's ring, hiatal hernia, polypoid gastric mucosa, bx= minimally active gastritis.   HOLMIUM LASER APPLICATION Left 09/20/6158   Procedure: HOLMIUM LASER APPLICATION;  Surgeon: Franchot Gallo, MD;  Location: [facility];  Service: Urology;  Laterality: Left;   IR GENERIC HISTORICAL  03/06/2016   IR RADIOLOGIST EVAL & MGMT 03/06/2016 Aletta Edouard, MD [facility] INTERV RAD  IR GENERIC HISTORICAL  06/18/2016   IR RADIOLOGIST EVAL & MGMT 06/18/2016 Aletta Edouard, MD [facility] INTERV RAD   IR RADIOLOGIST EVAL & MGMT  10/01/2016   IR RADIOLOGIST EVAL & MGMT  10/15/2017   IR RADIOLOGIST EVAL & MGMT  12/24/2018   IR RADIOLOGIST EVAL & MGMT  01/04/2020   IR RADIOLOGIST EVAL & MGMT  06/20/2021   LEFT HEART CATH N/A 02/02/2012   Procedure: LEFT HEART CATH;  Surgeon: Lorretta Harp, MD;  Location: [facility];  Service: Cardiovascular;  Laterality: N/A;    MEDIASTINOSCOPY     for dx sarcoid   RADIOLOGY WITH ANESTHESIA Left 05/17/2016   Procedure: left renal ablation;  Surgeon: Aletta Edouard, MD;  Location: [facility];  Service: Radiology;  Laterality: Left;    SOCIAL HISTORY:  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    Employer: RETIRED  Tobacco Use   Smoking status: Former    Packs/day: 0.10    Years: 2.00    Pack years: 0.20    Types: Cigarettes, Cigars    Quit date: 05/06/1970    Years since quitting: 51.4   Smokeless tobacco: Never  Vaping Use   Vaping Use: Never used  Substance and Sexual Activity   Alcohol use: No    Alcohol/week: 0.0 standard drinks   Drug use: No   Sexual activity: Never  Other Topics Concern   Not on file  Social History Narrative   Not on file   Social Determinants of Health   Financial Resource Strain: Not on file  Food Insecurity: No Food Insecurity   Worried About Charity fundraiser in the Last Year: Never true   Ran Out of Food in the Last Year: Never true  Transportation Needs: Not on file  Physical Activity: Not on file  Stress: Not on file  Social Connections: Not on file  Intimate Partner Violence: Not on file    FAMILY HISTORY:  Family History  Problem Relation Age of Onset   Heart disease Father        deceased age 20   Stroke Mother    Alzheimer's disease Mother    Heart attack Brother        deceased age 79   Cancer Other        niece   Colon cancer Neg Hx     CURRENT MEDICATIONS:  Current Outpatient Medications  Medication Sig Dispense Refill   acyclovir (ZOVIRAX) 400 MG tablet Take 1 tablet (400 mg total) by mouth 2 (two) times daily. 60 tablet 6   aspirin 81 MG tablet Take 81 mg by mouth every Monday, Wednesday, and Friday.      atorvastatin (LIPITOR) 40 MG tablet TAKE 1 TABLET BY MOUTH IN  THE EVENING 90 tablet 3   Bortezomib (VELCADE IJ) Inject as directed. Three weeks on and one weeks off.     brimonidine (ALPHAGAN) 0.2 % ophthalmic solution Place 1 drop into both eyes 3 (three) times daily. Noon dosing only in right eye morning and evening in both eyes     dexamethasone (DECADRON) 2 MG tablet TAKE 5 TABLETS(10 MG) BY MOUTH 1 TIME A WEEK 20 tablet 3   dorzolamide-timolol (COSOPT) 22.3-6.8  MG/ML ophthalmic solution Place 1 drop into both eyes 2 (two) times daily.     furosemide (LASIX) 20 MG tablet If you notice any swelling, you may take 40 mg (two tablets) for the day. (Patient taking differently: Take 20 mg by mouth daily. If more swelling take an additional 20 mg in the afternoon) 90 tablet 3   isosorbide mononitrate (IMDUR) 60 MG 24 hr tablet TAKE 1 AND 1/2 TABLETS BY  MOUTH IN THE MORNING AND  1/2 TABLET AT NIGHT (Patient taking differently: TAKE 60 mg BY  MOUTH IN THE MORNING AND  30 mg TABLET AT NIGHT) 180 tablet 3   levothyroxine (SYNTHROID) 75 MCG tablet Take 75 mcg by mouth daily.     losartan (COZAAR) 100 MG tablet TAKE 1 TABLET BY MOUTH  DAILY 90 tablet 1   metoCLOPramide (REGLAN) 10 MG tablet Take 1 tablet (10 mg total) by mouth every 6 (six) hours as needed for nausea or vomiting. 15 tablet 0   metoprolol tartrate (LOPRESSOR) 25 MG tablet TAKE 1 TABLET BY MOUTH IN  THE MORNING AND ONE-HALF  TABLET BY MOUTH IN THE  EVENING 135 tablet 3   Multiple Vitamins-Minerals (PRESERVISION/LUTEIN) CAPS Take 1 capsule by mouth 2 (two) times daily.     nitroGLYCERIN (NITROSTAT) 0.4 MG SL tablet Place 1 tablet (0.4 mg total) under the tongue every 5 (five) minutes as needed. For chest pain 25 tablet 2   pantoprazole (PROTONIX) 40 MG tablet TAKE 1 TABLET BY MOUTH  DAILY BEFORE BREAKFAST 90 tablet 1   potassium chloride SA (KLOR-CON M) 20 MEQ tablet TAKE 1 TABLET BY MOUTH  DAILY 90 tablet 3   prochlorperazine (COMPAZINE) 10 MG  tablet Take 1 tablet (10 mg total) by mouth every 6 (six) hours as needed for nausea or vomiting. 30 tablet 3   ROCKLATAN 0.02-0.005 % SOLN Apply 1 drop to eye at bedtime.     triamcinolone (KENALOG) 0.1 % cream Apply 1 application. topically 2 (two) times daily as needed (for irritation).     vitamin C (ASCORBIC ACID) 500 MG tablet Take 500 mg by mouth daily.     vitamin E 200 UNIT capsule Take 200 Units by mouth daily.     No current facility-administered medications for this visit.    ALLERGIES:  Allergies  Allergen Reactions   Azithromycin Other (See Comments)    Sore mouth and fever blisters around mouth, sores in nose area as well   Doxazosin Shortness Of Breath   Acetaminophen Other (See Comments)    REACTION: UNKNOWN REACTION   Atenolol Other (See Comments)    REACTION: UNKNOWN REACTION   Hydrocodone Nausea And Vomiting   Hydrocodone-Acetaminophen Nausea Only   Levofloxacin Other (See Comments)    Caused stomach problems.   Morphine Other (See Comments)    "made me crazy"   Penicillins Nausea And Vomiting and Other (See Comments)    Has patient had a PCN reaction causing immediate rash, facial/tongue/throat swelling, SOB or lightheadedness with hypotension: No Has patient had a PCN reaction causing severe rash involving mucus membranes or skin necrosis: No Has patient had a PCN reaction that required hospitalization No Has patient had a PCN reaction occurring within the last 10 years: No If all of the above answers are "NO", then may proceed with Cephalosporin use.    Sulfonamide Derivatives Nausea And Vomiting    PHYSICAL EXAM:  Performance status (ECOG): 0 - Asymptomatic  There were no vitals filed for this visit. Wt Readings  from Last 3 Encounters:  09/27/21 126 lb 11.2 oz (57.5 kg)  08/30/21 127 lb 4.8 oz (57.7 kg)  08/23/21 123 lb 12.8 oz (56.2 kg)   Physical Exam  LABORATORY DATA:  I have reviewed the labs as listed.     Latest Ref Rng & Units 09/27/2021     2:05 PM 09/20/2021    1:15 PM 09/13/2021   12:59 PM  CBC  WBC 4.0 - 10.5 K/uL 3.6   3.3   2.9    Hemoglobin 13.0 - 17.0 g/dL 6.7   7.7   7.8    Hematocrit 39.0 - 52.0 % 20.7   23.3   23.6    Platelets 150 - 400 K/uL 68   56   67        Latest Ref Rng & Units 09/27/2021    2:05 PM 09/20/2021    1:15 PM 09/13/2021   12:59 PM  CMP  Glucose 70 - 99 mg/dL 98   109   104    BUN 8 - 23 mg/dL _0 Creatinine 0.61 - 1.24 mg/dL 0.98   0.82   0.76    Sodium 135 - 145 mmol/L 133   134   135    Potassium 3.5 - 5.1 mmol/L 4.1   4.0   3.8    Chloride 98 - 111 mmol/L 106   105   106    CO2 22 - 32 mmol/L _1 Calcium 8.9 - 10.3 mg/dL 8.6   8.7   8.6    Total Protein 6.5 - 8.1 g/dL 7.6   7.9   8.1    Total Bilirubin 0.3 - 1.2 mg/dL 0.5   0.7   0.7    Alkaline Phos 38 - 126 U/L 55   60   56    AST 15 - 41 U/L _2 ALT 0 - 44 U/L _3 DIAGNOSTIC IMAGING:  I have independently reviewed the scans and discussed with the patient. No results found.   ASSESSMENT:  1.  IgG lambda plasma cell myeloma: - Work-up for macrocytic anemia on 12/01/2020 showed M spike of 2.9 g.  Immunofixation IgG lambda. - Lambda light chains elevated at 284.  Light chain ratio was 0.04.  LDH was 163.  Creatinine was 0.9 and calcium 8.9. - Bone marrow biopsy on 12/18/2020-hypercellular marrow for age with 48% atypical plasma cells with lambda light chain restriction. - Chromosome analysis was normal. 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 - Revlimid 5 mg, 2 weeks on/1 week off started on 01/20/2021.  Dexamethasone weekly 10 mg added on 02/07/2021.  Revlimid is discontinued around 06/15/2021 due to poor tolerance and ineffectiveness at low-dose.  Thrombocytopenia and anemia. - Velcade weekly on days 1, 8, 15 every 21 days along with dexamethasone 10 mg weekly started on 06/25/2021.  2.  Macrocytic  anemia: - CBC on 12/01/2020 with hemoglobin 8.5 and MCV of 117. - Denies any bleeding per rectum or melena.  3.  Social/family history: - Lives at home with his wife.  He does all ADLs and IADLs.  He even does yard work. - He worked at [employer] for 35 years.  Denies any chemical exposure.  Non-smoker. -  No family history of malignancies.   PLAN:  1.  IgG lambda plasma cell myeloma, high risk: - Revlimid was discontinued due to poor tolerance. - He is tolerating Velcade weekly very well.  He has diarrhea times two 1 day after Velcade injection.  No neuropathy symptoms. - Reviewed labs from 08/23/2021 which showed M spike 3.4 and stable.  Lambda light chains have decreased to 484.5 with ratio of 0.02. - Recommend continuing weekly Velcade which is better tolerated by him.  He will continue dexamethasone 10 mg weekly. - RTC 5 weeks for follow-up.  1 week prior to repeat myeloma labs.   2.  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  He has received 1 unit PRBC on 08/16/2021 and 07/29/2021. - Transfuse if hemoglobin less than 7.  3.  Thromboprophylaxis: - Plavix was stopped on 06/06/2021 because of rectal bleeding.  Platelet count is low from myeloma treatments.  4.  Right ankle swelling: - Mild leg swellings at ankles is stable.  Lasix as needed.  5.  ID prophylaxis: - Continue acyclovir twice daily.   Orders placed this encounter:  No orders of the defined types were placed in this encounter.    Derek Jack, MD [facility] [phone]   I, Thana Ates, am acting as a scribe for Dr. Derek Jack.  {Add Barista Statement}

## 2021-10-04 ENCOUNTER — Inpatient Hospital Stay (HOSPITAL_COMMUNITY): Payer: Medicare Other

## 2021-10-04 ENCOUNTER — Inpatient Hospital Stay (HOSPITAL_COMMUNITY): Payer: Medicare Other | Attending: Hematology

## 2021-10-04 ENCOUNTER — Inpatient Hospital Stay (HOSPITAL_BASED_OUTPATIENT_CLINIC_OR_DEPARTMENT_OTHER): Payer: Medicare Other | Admitting: Hematology

## 2021-10-04 ENCOUNTER — Other Ambulatory Visit (HOSPITAL_COMMUNITY): Payer: Self-pay

## 2021-10-04 VITALS — BP 133/57 | HR 69 | Temp 98.0°F | Resp 18 | Ht 67.72 in | Wt 128.1 lb

## 2021-10-04 DIAGNOSIS — D649 Anemia, unspecified: Secondary | ICD-10-CM

## 2021-10-04 DIAGNOSIS — Z5112 Encounter for antineoplastic immunotherapy: Secondary | ICD-10-CM | POA: Diagnosis not present

## 2021-10-04 DIAGNOSIS — D630 Anemia in neoplastic disease: Secondary | ICD-10-CM | POA: Diagnosis not present

## 2021-10-04 DIAGNOSIS — C9000 Multiple myeloma not having achieved remission: Secondary | ICD-10-CM

## 2021-10-04 DIAGNOSIS — D6481 Anemia due to antineoplastic chemotherapy: Secondary | ICD-10-CM | POA: Diagnosis not present

## 2021-10-04 LAB — CBC WITH DIFFERENTIAL/PLATELET
Abs Immature Granulocytes: 0 10*3/uL (ref 0.00–0.07)
Basophils Absolute: 0 10*3/uL (ref 0.0–0.1)
Basophils Relative: 0 %
Eosinophils Absolute: 0.1 10*3/uL (ref 0.0–0.5)
Eosinophils Relative: 4 %
HCT: 24.3 % — ABNORMAL LOW (ref 39.0–52.0)
Hemoglobin: 7.9 g/dL — ABNORMAL LOW (ref 13.0–17.0)
Immature Granulocytes: 0 %
Lymphocytes Relative: 36 %
Lymphs Abs: 1.2 10*3/uL (ref 0.7–4.0)
MCH: 33.3 pg (ref 26.0–34.0)
MCHC: 32.5 g/dL (ref 30.0–36.0)
MCV: 102.5 fL — ABNORMAL HIGH (ref 80.0–100.0)
Monocytes Absolute: 0.7 10*3/uL (ref 0.1–1.0)
Monocytes Relative: 24 %
Neutro Abs: 1.1 10*3/uL — ABNORMAL LOW (ref 1.7–7.7)
Neutrophils Relative %: 36 %
Platelets: 61 10*3/uL — ABNORMAL LOW (ref 150–400)
RBC: 2.37 MIL/uL — ABNORMAL LOW (ref 4.22–5.81)
RDW: 22.6 % — ABNORMAL HIGH (ref 11.5–15.5)
WBC: 3.1 10*3/uL — ABNORMAL LOW (ref 4.0–10.5)
nRBC: 0 % (ref 0.0–0.2)

## 2021-10-04 LAB — COMPREHENSIVE METABOLIC PANEL
ALT: 16 U/L (ref 0–44)
AST: 20 U/L (ref 15–41)
Albumin: 3 g/dL — ABNORMAL LOW (ref 3.5–5.0)
Alkaline Phosphatase: 66 U/L (ref 38–126)
Anion gap: 2 — ABNORMAL LOW (ref 5–15)
BUN: 24 mg/dL — ABNORMAL HIGH (ref 8–23)
CO2: 25 mmol/L (ref 22–32)
Calcium: 8.6 mg/dL — ABNORMAL LOW (ref 8.9–10.3)
Chloride: 106 mmol/L (ref 98–111)
Creatinine, Ser: 0.83 mg/dL (ref 0.61–1.24)
GFR, Estimated: >60 mL/min (ref >60)
Glucose, Bld: 112 mg/dL — ABNORMAL HIGH (ref 70–99)
Potassium: 4.1 mmol/L (ref 3.5–5.1)
Sodium: 133 mmol/L — ABNORMAL LOW (ref 135–145)
Total Bilirubin: 0.9 mg/dL (ref 0.3–1.2)
Total Protein: 8.3 g/dL — ABNORMAL HIGH (ref 6.5–8.1)

## 2021-10-04 LAB — SAMPLE TO BLOOD BANK

## 2021-10-04 LAB — MAGNESIUM: Magnesium: 2.3 mg/dL (ref 1.7–2.4)

## 2021-10-04 MED ORDER — BORTEZOMIB CHEMO SQ INJECTION 3.5 MG (2.5MG/ML)
1.0000 mg/m2 | Freq: Once | INTRAMUSCULAR | Status: AC
  Administered 2021-10-04: 1.75 mg via SUBCUTANEOUS
  Filled 2021-10-04: qty 0.7

## 2021-10-04 NOTE — Progress Notes (Signed)
Pt presents today for Velcade injection per provider's order. Labs and vital signs WNL for treatment.Okay to proceed with treatment today per Dr.K.  Velcade injection given today per MD orders. Tolerated infusion without adverse affects. Vital signs stable. No complaints at this time. Discharged from clinic ambulatory in stable condition. Alert and oriented x 3. F/U with [facility] as scheduled.

## 2021-10-04 NOTE — Progress Notes (Signed)
Patient has been assessed, vital signs and labs have been reviewed by Dr. Delton Coombes. ANC, Creatinine, LFTs, and Platelets are within treatment parameters per Dr. Delton Coombes. The patient is good to proceed with treatment reducing Velcade to 1 mg/ml at this time.  Primary RN and pharmacy aware.

## 2021-10-04 NOTE — Patient Instructions (Signed)
[facility]  Discharge Instructions: Thank you for choosing [facility] to provide your oncology and hematology care.  If you have a lab appointment with the Cancer Center, please come in thru the Main Entrance and check in at the main information desk.  Wear comfortable clothing and clothing appropriate for easy access to any Portacath or PICC line.   We strive to give you quality time with your provider. You may need to reschedule your appointment if you arrive late (15 or more minutes).  Arriving late affects you and other patients whose appointments are after yours.  Also, if you miss three or more appointments without notifying the office, you may be dismissed from the clinic at the provider's discretion.      For prescription refill requests, have your pharmacy contact our office and allow 72 hours for refills to be completed.    Today you received the following chemotherapy and/or immunotherapy agents Velcade injection.   To help prevent nausea and vomiting after your treatment, we encourage you to take your nausea medication as directed.  BELOW ARE SYMPTOMS THAT SHOULD BE REPORTED IMMEDIATELY: *FEVER GREATER THAN 100.4 F (38 C) OR HIGHER *CHILLS OR SWEATING *NAUSEA AND VOMITING THAT IS NOT CONTROLLED WITH YOUR NAUSEA MEDICATION *UNUSUAL SHORTNESS OF BREATH *UNUSUAL BRUISING OR BLEEDING *URINARY PROBLEMS (pain or burning when urinating, or frequent urination) *BOWEL PROBLEMS (unusual diarrhea, constipation, pain near the anus) TENDERNESS IN MOUTH AND THROAT WITH OR WITHOUT PRESENCE OF ULCERS (sore throat, sores in mouth, or a toothache) UNUSUAL RASH, SWELLING OR PAIN  UNUSUAL VAGINAL DISCHARGE OR ITCHING   Items with * indicate a potential emergency and should be followed up as soon as possible or go to the Emergency Department if any problems should occur.  Please show the CHEMOTHERAPY ALERT CARD or IMMUNOTHERAPY ALERT CARD at check-in to the Emergency  Department and triage nurse.  Should you have questions after your visit or need to cancel or reschedule your appointment, please contact [facility] [phone]  and follow the prompts.  Office hours are 8:00 a.m. to 4:30 p.m. Monday - Friday. Please note that voicemails left after 4:00 p.m. may not be returned until the following business day.  We are closed weekends and major holidays. You have access to a nurse at all times for urgent questions. Please call the main number to the clinic [phone] and follow the prompts.  For any non-urgent questions, you may also contact your provider using MyChart. We now offer e-Visits for anyone 18 and older to request care online for non-urgent symptoms. For details visit [email or web address].   Also download the MyChart app! Go to the app store, search "MyChart", open the app, select [facility], and log in with your MyChart username and password.  Due to Covid, a mask is required upon entering the hospital/clinic. If you do not have a mask, one will be given to you upon arrival. For doctor visits, patients may have 1 support person aged 18 or older with them. For treatment visits, patients cannot have anyone with them due to current Covid guidelines and our immunocompromised population.   Bortezomib injection What is this medication? BORTEZOMIB (bor TEZ oh mib) targets proteins in cancer cells and stops the cancer cells from growing. It treats multiple myeloma and mantle cell lymphoma. This medicine may be used for other purposes; ask your health care provider or pharmacist if you have questions. COMMON BRAND NAME(S): Velcade What should I tell my care   team before I take this medication? They need to know if you have any of these conditions: dehydration diabetes (high blood sugar) heart disease liver disease tingling of the fingers or toes or other nerve disorder an unusual or allergic reaction to bortezomib, mannitol, boron, other  medicines, foods, dyes, or preservatives pregnant or trying to get pregnant breast-feeding How should I use this medication? This medicine is injected into a vein or under the skin. It is given by a health care provider in a hospital or clinic setting. Talk to your health care provider about the use of this medicine in children. Special care may be needed. Overdosage: If you think you have taken too much of this medicine contact a poison control center or emergency room at once. NOTE: This medicine is only for you. Do not share this medicine with others. What if I miss a dose? Keep appointments for follow-up doses. It is important not to miss your dose. Call your health care provider if you are unable to keep an appointment. What may interact with this medication? This medicine may interact with the following medications: ketoconazole rifampin This list may not describe all possible interactions. Give your health care provider a list of all the medicines, herbs, non-prescription drugs, or dietary supplements you use. Also tell them if you smoke, drink alcohol, or use illegal drugs. Some items may interact with your medicine. What should I watch for while using this medication? Your condition will be monitored carefully while you are receiving this medicine. You may need blood work done while you are taking this medicine. You may get drowsy or dizzy. Do not drive, use machinery, or do anything that needs mental alertness until you know how this medicine affects you. Do not stand up or sit up quickly, especially if you are an older patient. This reduces the risk of dizzy or fainting spells This medicine may increase your risk of getting an infection. Call your health care provider for advice if you get a fever, chills, sore throat, or other symptoms of a cold or flu. Do not treat yourself. Try to avoid being around people who are sick. Check with your health care provider if you have severe  diarrhea, nausea, and vomiting, or if you sweat a lot. The loss of too much body fluid may make it dangerous for you to take this medicine. Do not become pregnant while taking this medicine or for 7 months after stopping it. Women should inform their health care provider if they wish to become pregnant or think they might be pregnant. Men should not father a child while taking this medicine and for 4 months after stopping it. There is a potential for serious harm to an unborn child. Talk to your health care provider for more information. Do not breast-feed an infant while taking this medicine or for 2 months after stopping it. This medicine may make it more difficult to get pregnant or father a child. Talk to your health care provider if you are concerned about your fertility. What side effects may I notice from receiving this medication? Side effects that you should report to your doctor or health care professional as soon as possible: allergic reactions (skin rash; itching or hives; swelling of the face, lips, or tongue) bleeding (bloody or black, tarry stools; red or dark brown urine; spitting up blood or brown material that looks like coffee grounds; red spots on the skin; unusual bruising or bleeding from the eye, gums, or nose) blurred vision   or changes in vision confusion constipation headache heart failure (trouble breathing; fast, irregular heartbeat; sudden weight gain; swelling of the ankles, feet, hands) infection (fever, chills, cough, sore throat, pain or trouble passing urine) lack or loss of appetite liver injury (dark yellow or brown urine; general ill feeling or flu-like symptoms; loss of appetite, right upper belly pain; yellowing of the eyes or skin) low blood pressure (dizziness; feeling faint or lightheaded, falls; unusually weak or tired) muscle cramps pain, redness, or irritation at site where injected pain, tingling, numbness in the hands or feet seizures trouble  breathing unusual bruising or bleeding Side effects that usually do not require medical attention (report to your doctor or health care professional if they continue or are bothersome): diarrhea nausea stomach pain trouble sleeping vomiting This list may not describe all possible side effects. Call your doctor for medical advice about side effects. You may report side effects to FDA at 1-800-FDA-1088. Where should I keep my medication? This medicine is given in a hospital or clinic. It will not be stored at home. NOTE: This sheet is a summary. It may not cover all possible information. If you have questions about this medicine, talk to your doctor, pharmacist, or health care provider.  2023 Elsevier/Gold Standard (2020-04-13 00:00:00)  

## 2021-10-04 NOTE — Patient Instructions (Signed)
[place] at [facility] Discharge Instructions  You were seen and examined today by Dr. Delton Coombes.  Dr. Delton Coombes discussed your most recent lab work and your myeloma protein has improved with the injection. Continue receiving your in injections. Take imodium to help with your diarrhea.  Follow-up as scheduled in 5 weeks.    Thank you for choosing [place] at [facility] to provide your oncology and hematology care.  To afford each patient quality time with our provider, please arrive at least 15 minutes before your scheduled appointment time.   If you have a lab appointment with the [place] please come in thru the Main Entrance and check in at the main information desk.  You need to re-schedule your appointment should you arrive 10 or more minutes late.  We strive to give you quality time with our providers, and arriving late affects you and other patients whose appointments are after yours.  Also, if you no show three or more times for appointments you may be dismissed from the clinic at the providers discretion.     Again, thank you for choosing [facility].  Our hope is that these requests will decrease the amount of time that you wait before being seen by our physicians.       _____________________________________________________________  Should you have questions after your visit to [facility], please contact our office at [phone] and follow the prompts.  Our office hours are 8:00 a.m. and 4:30 p.m. Monday - Friday.  Please note that voicemails left after 4:00 p.m. may not be returned until the following business day.  We are closed weekends and major holidays.  You do have access to a nurse 24-7, just call the main number to the clinic [phone] and do not press any options, hold on the line and a nurse will answer the phone.    For prescription refill requests, have your pharmacy contact our  office and allow 72 hours.    Due to Covid, you will need to wear a mask upon entering the hospital. If you do not have a mask, a mask will be given to you at the Main Entrance upon arrival. For doctor visits, patients may have 1 support person age 24 or older with them. For treatment visits, patients can not have anyone with them due to social distancing guidelines and our immunocompromised population.

## 2021-10-11 ENCOUNTER — Inpatient Hospital Stay (HOSPITAL_COMMUNITY): Payer: Medicare Other

## 2021-10-11 ENCOUNTER — Other Ambulatory Visit (HOSPITAL_COMMUNITY): Payer: Self-pay

## 2021-10-11 VITALS — BP 134/56 | HR 66 | Temp 98.1°F | Resp 18 | Wt 130.0 lb

## 2021-10-11 DIAGNOSIS — D6481 Anemia due to antineoplastic chemotherapy: Secondary | ICD-10-CM | POA: Diagnosis not present

## 2021-10-11 DIAGNOSIS — D649 Anemia, unspecified: Secondary | ICD-10-CM

## 2021-10-11 DIAGNOSIS — C9000 Multiple myeloma not having achieved remission: Secondary | ICD-10-CM | POA: Diagnosis not present

## 2021-10-11 DIAGNOSIS — Z5112 Encounter for antineoplastic immunotherapy: Secondary | ICD-10-CM | POA: Diagnosis not present

## 2021-10-11 DIAGNOSIS — D630 Anemia in neoplastic disease: Secondary | ICD-10-CM | POA: Diagnosis not present

## 2021-10-11 DIAGNOSIS — D696 Thrombocytopenia, unspecified: Secondary | ICD-10-CM

## 2021-10-11 LAB — SAMPLE TO BLOOD BANK

## 2021-10-11 LAB — COMPREHENSIVE METABOLIC PANEL
ALT: 15 U/L (ref 0–44)
AST: 18 U/L (ref 15–41)
Albumin: 2.9 g/dL — ABNORMAL LOW (ref 3.5–5.0)
Alkaline Phosphatase: 62 U/L (ref 38–126)
Anion gap: 3 — ABNORMAL LOW (ref 5–15)
BUN: 32 mg/dL — ABNORMAL HIGH (ref 8–23)
CO2: 23 mmol/L (ref 22–32)
Calcium: 8.2 mg/dL — ABNORMAL LOW (ref 8.9–10.3)
Chloride: 103 mmol/L (ref 98–111)
Creatinine, Ser: 0.94 mg/dL (ref 0.61–1.24)
GFR, Estimated: >60 mL/min (ref >60)
Glucose, Bld: 99 mg/dL (ref 70–99)
Potassium: 4.2 mmol/L (ref 3.5–5.1)
Sodium: 129 mmol/L — ABNORMAL LOW (ref 135–145)
Total Bilirubin: 0.5 mg/dL (ref 0.3–1.2)
Total Protein: 7.8 g/dL (ref 6.5–8.1)

## 2021-10-11 LAB — CBC WITH DIFFERENTIAL/PLATELET
Abs Immature Granulocytes: 0 10*3/uL (ref 0.00–0.07)
Basophils Absolute: 0 10*3/uL (ref 0.0–0.1)
Basophils Relative: 0 %
Eosinophils Absolute: 0.2 10*3/uL (ref 0.0–0.5)
Eosinophils Relative: 5 %
HCT: 21.8 % — ABNORMAL LOW (ref 39.0–52.0)
Hemoglobin: 7.1 g/dL — ABNORMAL LOW (ref 13.0–17.0)
Immature Granulocytes: 0 %
Lymphocytes Relative: 26 %
Lymphs Abs: 1 10*3/uL (ref 0.7–4.0)
MCH: 34.1 pg — ABNORMAL HIGH (ref 26.0–34.0)
MCHC: 32.6 g/dL (ref 30.0–36.0)
MCV: 104.8 fL — ABNORMAL HIGH (ref 80.0–100.0)
Monocytes Absolute: 0.9 10*3/uL (ref 0.1–1.0)
Monocytes Relative: 24 %
Neutro Abs: 1.6 10*3/uL — ABNORMAL LOW (ref 1.7–7.7)
Neutrophils Relative %: 45 %
Platelets: 76 10*3/uL — ABNORMAL LOW (ref 150–400)
RBC: 2.08 MIL/uL — ABNORMAL LOW (ref 4.22–5.81)
RDW: 23 % — ABNORMAL HIGH (ref 11.5–15.5)
WBC: 3.6 10*3/uL — ABNORMAL LOW (ref 4.0–10.5)
nRBC: 0 % (ref 0.0–0.2)

## 2021-10-11 LAB — MAGNESIUM: Magnesium: 2.1 mg/dL (ref 1.7–2.4)

## 2021-10-11 MED ORDER — BORTEZOMIB CHEMO SQ INJECTION 3.5 MG (2.5MG/ML)
1.0000 mg/m2 | Freq: Once | INTRAMUSCULAR | Status: AC
  Administered 2021-10-11: 1.75 mg via SUBCUTANEOUS
  Filled 2021-10-11: qty 0.7

## 2021-10-11 NOTE — Patient Instructions (Signed)
[name]  Discharge Instructions: Thank you for choosing [place] to provide your oncology and hematology care.  If you have a lab appointment with the [place], please come in thru the Main Entrance and check in at the main information desk.  Wear comfortable clothing and clothing appropriate for easy access to any Portacath or PICC line.   We strive to give you quality time with your provider. You may need to reschedule your appointment if you arrive late (15 or more minutes).  Arriving late affects you and other patients whose appointments are after yours.  Also, if you miss three or more appointments without notifying the office, you may be dismissed from the clinic at the provider's discretion.      For prescription refill requests, have your pharmacy contact our office and allow 72 hours for refills to be completed.    Today you received the following chemotherapy and/or immunotherapy agents velcade       To help prevent nausea and vomiting after your treatment, we encourage you to take your nausea medication as directed.  BELOW ARE SYMPTOMS THAT SHOULD BE REPORTED IMMEDIATELY: *FEVER GREATER THAN 100.4 F (38 C) OR HIGHER *CHILLS OR SWEATING *NAUSEA AND VOMITING THAT IS NOT CONTROLLED WITH YOUR NAUSEA MEDICATION *UNUSUAL SHORTNESS OF BREATH *UNUSUAL BRUISING OR BLEEDING *URINARY PROBLEMS (pain or burning when urinating, or frequent urination) *BOWEL PROBLEMS (unusual diarrhea, constipation, pain near the anus) TENDERNESS IN MOUTH AND THROAT WITH OR WITHOUT PRESENCE OF ULCERS (sore throat, sores in mouth, or a toothache) UNUSUAL RASH, SWELLING OR PAIN  UNUSUAL VAGINAL DISCHARGE OR ITCHING   Items with * indicate a potential emergency and should be followed up as soon as possible or go to the Emergency Department if any problems should occur.  Please show the CHEMOTHERAPY ALERT CARD or IMMUNOTHERAPY ALERT CARD at check-in to the Emergency  Department and triage nurse.  Should you have questions after your visit or need to cancel or reschedule your appointment, please contact [facility] [phone]  and follow the prompts.  Office hours are 8:00 a.m. to 4:30 p.m. Monday - Friday. Please note that voicemails left after 4:00 p.m. may not be returned until the following business day.  We are closed weekends and major holidays. You have access to a nurse at all times for urgent questions. Please call the main number to the clinic [phone] and follow the prompts.  For any non-urgent questions, you may also contact your provider using MyChart. We now offer e-Visits for anyone 86 and older to request care online for non-urgent symptoms. For details visit [email or web address].   Also download the MyChart app! Go to the app store, search "MyChart", open the app, select [place], and log in with your MyChart username and password.  Due to Covid, a mask is required upon entering the hospital/clinic. If you do not have a mask, one will be given to you upon arrival. For doctor visits, patients may have 1 support person aged 60 or older with them. For treatment visits, patients cannot have anyone with them due to current Covid guidelines and our immunocompromised population.

## 2021-10-11 NOTE — Progress Notes (Signed)
Labs reviewed , ok to treat per MD. Platelets noted.   Treatment given per orders. Patient tolerated it well without problems. Vitals stable and discharged home from clinic ambulatory. Follow up as scheduled.

## 2021-10-16 DIAGNOSIS — Z885 Allergy status to narcotic agent status: Secondary | ICD-10-CM | POA: Diagnosis not present

## 2021-10-16 DIAGNOSIS — Z881 Allergy status to other antibiotic agents status: Secondary | ICD-10-CM | POA: Diagnosis not present

## 2021-10-16 DIAGNOSIS — Z85828 Personal history of other malignant neoplasm of skin: Secondary | ICD-10-CM | POA: Diagnosis not present

## 2021-10-16 DIAGNOSIS — Z79899 Other long term (current) drug therapy: Secondary | ICD-10-CM | POA: Diagnosis not present

## 2021-10-16 DIAGNOSIS — Z888 Allergy status to other drugs, medicaments and biological substances status: Secondary | ICD-10-CM | POA: Diagnosis not present

## 2021-10-16 DIAGNOSIS — E785 Hyperlipidemia, unspecified: Secondary | ICD-10-CM | POA: Diagnosis not present

## 2021-10-16 DIAGNOSIS — E039 Hypothyroidism, unspecified: Secondary | ICD-10-CM | POA: Diagnosis not present

## 2021-10-16 DIAGNOSIS — Z79890 Hormone replacement therapy (postmenopausal): Secondary | ICD-10-CM | POA: Diagnosis not present

## 2021-10-16 DIAGNOSIS — H401113 Primary open-angle glaucoma, right eye, severe stage: Secondary | ICD-10-CM | POA: Diagnosis not present

## 2021-10-16 DIAGNOSIS — I2510 Atherosclerotic heart disease of native coronary artery without angina pectoris: Secondary | ICD-10-CM | POA: Diagnosis not present

## 2021-10-16 DIAGNOSIS — Z882 Allergy status to sulfonamides status: Secondary | ICD-10-CM | POA: Diagnosis not present

## 2021-10-16 DIAGNOSIS — I509 Heart failure, unspecified: Secondary | ICD-10-CM | POA: Diagnosis not present

## 2021-10-16 DIAGNOSIS — Z955 Presence of coronary angioplasty implant and graft: Secondary | ICD-10-CM | POA: Diagnosis not present

## 2021-10-16 DIAGNOSIS — Z886 Allergy status to analgesic agent status: Secondary | ICD-10-CM | POA: Diagnosis not present

## 2021-10-16 DIAGNOSIS — L03115 Cellulitis of right lower limb: Secondary | ICD-10-CM | POA: Diagnosis not present

## 2021-10-16 DIAGNOSIS — I110 Hypertensive heart disease with heart failure: Secondary | ICD-10-CM | POA: Diagnosis not present

## 2021-10-16 DIAGNOSIS — Z880 Allergy status to penicillin: Secondary | ICD-10-CM | POA: Diagnosis not present

## 2021-10-16 DIAGNOSIS — Z951 Presence of aortocoronary bypass graft: Secondary | ICD-10-CM | POA: Diagnosis not present

## 2021-10-16 DIAGNOSIS — Z7902 Long term (current) use of antithrombotics/antiplatelets: Secondary | ICD-10-CM | POA: Diagnosis not present

## 2021-10-18 ENCOUNTER — Inpatient Hospital Stay (HOSPITAL_COMMUNITY): Payer: Medicare Other

## 2021-10-18 VITALS — BP 135/52 | HR 65 | Temp 97.2°F | Resp 18 | Wt 126.2 lb

## 2021-10-18 DIAGNOSIS — C9000 Multiple myeloma not having achieved remission: Secondary | ICD-10-CM

## 2021-10-18 DIAGNOSIS — D649 Anemia, unspecified: Secondary | ICD-10-CM

## 2021-10-18 DIAGNOSIS — Z5112 Encounter for antineoplastic immunotherapy: Secondary | ICD-10-CM | POA: Diagnosis not present

## 2021-10-18 DIAGNOSIS — D630 Anemia in neoplastic disease: Secondary | ICD-10-CM | POA: Diagnosis not present

## 2021-10-18 DIAGNOSIS — D6481 Anemia due to antineoplastic chemotherapy: Secondary | ICD-10-CM | POA: Diagnosis not present

## 2021-10-18 LAB — CBC WITH DIFFERENTIAL/PLATELET
Abs Immature Granulocytes: 0.01 10*3/uL (ref 0.00–0.07)
Basophils Absolute: 0 10*3/uL (ref 0.0–0.1)
Basophils Relative: 0 %
Eosinophils Absolute: 0.2 10*3/uL (ref 0.0–0.5)
Eosinophils Relative: 7 %
HCT: 20.1 % — ABNORMAL LOW (ref 39.0–52.0)
Hemoglobin: 6.5 g/dL — CL (ref 13.0–17.0)
Immature Granulocytes: 0 %
Lymphocytes Relative: 30 %
Lymphs Abs: 0.9 10*3/uL (ref 0.7–4.0)
MCH: 34.4 pg — ABNORMAL HIGH (ref 26.0–34.0)
MCHC: 32.3 g/dL (ref 30.0–36.0)
MCV: 106.3 fL — ABNORMAL HIGH (ref 80.0–100.0)
Monocytes Absolute: 0.6 10*3/uL (ref 0.1–1.0)
Monocytes Relative: 19 %
Neutro Abs: 1.4 10*3/uL — ABNORMAL LOW (ref 1.7–7.7)
Neutrophils Relative %: 44 %
Platelets: 92 10*3/uL — ABNORMAL LOW (ref 150–400)
RBC: 1.89 MIL/uL — ABNORMAL LOW (ref 4.22–5.81)
RDW: 23.6 % — ABNORMAL HIGH (ref 11.5–15.5)
WBC: 3.1 10*3/uL — ABNORMAL LOW (ref 4.0–10.5)
nRBC: 0 % (ref 0.0–0.2)

## 2021-10-18 LAB — COMPREHENSIVE METABOLIC PANEL
ALT: 15 U/L (ref 0–44)
AST: 18 U/L (ref 15–41)
Albumin: 2.9 g/dL — ABNORMAL LOW (ref 3.5–5.0)
Alkaline Phosphatase: 67 U/L (ref 38–126)
Anion gap: 3 — ABNORMAL LOW (ref 5–15)
BUN: 36 mg/dL — ABNORMAL HIGH (ref 8–23)
CO2: 21 mmol/L — ABNORMAL LOW (ref 22–32)
Calcium: 8.8 mg/dL — ABNORMAL LOW (ref 8.9–10.3)
Chloride: 110 mmol/L (ref 98–111)
Creatinine, Ser: 1.01 mg/dL (ref 0.61–1.24)
GFR, Estimated: >60 mL/min (ref >60)
Glucose, Bld: 90 mg/dL (ref 70–99)
Potassium: 3.9 mmol/L (ref 3.5–5.1)
Sodium: 134 mmol/L — ABNORMAL LOW (ref 135–145)
Total Bilirubin: 0.7 mg/dL (ref 0.3–1.2)
Total Protein: 7.8 g/dL (ref 6.5–8.1)

## 2021-10-18 LAB — SAMPLE TO BLOOD BANK

## 2021-10-18 LAB — PREPARE RBC (CROSSMATCH)

## 2021-10-18 MED ORDER — ACETAMINOPHEN 325 MG PO TABS
650.0000 mg | ORAL_TABLET | Freq: Once | ORAL | Status: AC
  Administered 2021-10-18: 650 mg via ORAL
  Filled 2021-10-18: qty 2

## 2021-10-18 MED ORDER — DIPHENHYDRAMINE HCL 25 MG PO CAPS
25.0000 mg | ORAL_CAPSULE | Freq: Once | ORAL | Status: AC
  Administered 2021-10-18: 25 mg via ORAL
  Filled 2021-10-18: qty 1

## 2021-10-18 MED ORDER — BORTEZOMIB CHEMO SQ INJECTION 3.5 MG (2.5MG/ML)
1.0000 mg/m2 | Freq: Once | INTRAMUSCULAR | Status: AC
  Administered 2021-10-18: 1.75 mg via SUBCUTANEOUS
  Filled 2021-10-18: qty 0.7

## 2021-10-18 MED ORDER — SODIUM CHLORIDE 0.9% IV SOLUTION
250.0000 mL | Freq: Once | INTRAVENOUS | Status: AC
  Administered 2021-10-18: 250 mL via INTRAVENOUS

## 2021-10-18 NOTE — Progress Notes (Signed)
CRITICAL VALUE ALERT Critical value received:  6.5 hgb Date of notification:  10-18-21 Time of notification: 1102 Critical value read back:  Yes.   Nurse who received alert:  C. Normand Damron RN MD notified time and response:  1117, will give one unit blood today per MD.

## 2021-10-18 NOTE — Progress Notes (Signed)
Patient ppresents today for Velcade, Hemoglobin 6.5, ANC 1.4. Per Dr. Delton Coombes patient is okay for Velcade today with additional orders for 1 unit of blood.  Patient tolerated blood transfusion with no complaints voiced. Patient tolerated Velcade injection with no complaints voiced. Lab work reviewed. See MAR for details. Injection site clean and dry with no bruising or swelling noted. Patient stable during and after injection. Band aid applied. VSS. Patient left in satisfactory condition with no s/s of distress noted.

## 2021-10-18 NOTE — Patient Instructions (Signed)
[place]  Discharge Instructions: Thank you for choosing [place] to provide your oncology and hematology care.  If you have a lab appointment with the [place], please come in thru the Main Entrance and check in at the main information desk.  Wear comfortable clothing and clothing appropriate for easy access to any Portacath or PICC line.   We strive to give you quality time with your provider. You may need to reschedule your appointment if you arrive late (15 or more minutes).  Arriving late affects you and other patients whose appointments are after yours.  Also, if you miss three or more appointments without notifying the office, you may be dismissed from the clinic at the provider's discretion.      For prescription refill requests, have your pharmacy contact our office and allow 72 hours for refills to be completed.    Today you received the following chemotherapy and/or immunotherapy agents Velcade, and 1 unit of blood, return as scheduled.   To help prevent nausea and vomiting after your treatment, we encourage you to take your nausea medication as directed.  BELOW ARE SYMPTOMS THAT SHOULD BE REPORTED IMMEDIATELY: *FEVER GREATER THAN 100.4 F (38 C) OR HIGHER *CHILLS OR SWEATING *NAUSEA AND VOMITING THAT IS NOT CONTROLLED WITH YOUR NAUSEA MEDICATION *UNUSUAL SHORTNESS OF BREATH *UNUSUAL BRUISING OR BLEEDING *URINARY PROBLEMS (pain or burning when urinating, or frequent urination) *BOWEL PROBLEMS (unusual diarrhea, constipation, pain near the anus) TENDERNESS IN MOUTH AND THROAT WITH OR WITHOUT PRESENCE OF ULCERS (sore throat, sores in mouth, or a toothache) UNUSUAL RASH, SWELLING OR PAIN  UNUSUAL VAGINAL DISCHARGE OR ITCHING   Items with * indicate a potential emergency and should be followed up as soon as possible or go to the Emergency Department if any problems should occur.  Please show the CHEMOTHERAPY ALERT CARD or IMMUNOTHERAPY ALERT  CARD at check-in to the Emergency Department and triage nurse.  Should you have questions after your visit or need to cancel or reschedule your appointment, please contact [facility] [phone]  and follow the prompts.  Office hours are 8:00 a.m. to 4:30 p.m. Monday - Friday. Please note that voicemails left after 4:00 p.m. may not be returned until the following business day.  We are closed weekends and major holidays. You have access to a nurse at all times for urgent questions. Please call the main number to the clinic [phone] and follow the prompts.  For any non-urgent questions, you may also contact your provider using MyChart. We now offer e-Visits for anyone 86 and older to request care online for non-urgent symptoms. For details visit [email or web address].   Also download the MyChart app! Go to the app store, search "MyChart", open the app, select [place], and log in with your MyChart username and password.  Masks are optional in the cancer centers. If you would like for your care team to wear a mask while they are taking care of you, please let them know. For doctor visits, patients may have with them one support person who is at least 86 years old. At this time, visitors are not allowed in the infusion area. Bortezomib injection What is this medication? BORTEZOMIB (bor TEZ oh mib) targets proteins in cancer cells and stops the cancer cells from growing. It treats multiple myeloma and mantle cell lymphoma. This medicine may be used for other purposes; ask your health care provider or pharmacist if you have questions. COMMON BRAND NAME(S): Velcade What  should I tell my care team before I take this medication? They need to know if you have any of these conditions: dehydration diabetes (high blood sugar) heart disease liver disease tingling of the fingers or toes or other nerve disorder an unusual or allergic reaction to bortezomib, mannitol, boron, other  medicines, foods, dyes, or preservatives pregnant or trying to get pregnant breast-feeding How should I use this medication? This medicine is injected into a vein or under the skin. It is given by a health care provider in a hospital or clinic setting. Talk to your health care provider about the use of this medicine in children. Special care may be needed. Overdosage: If you think you have taken too much of this medicine contact a poison control center or emergency room at once. NOTE: This medicine is only for you. Do not share this medicine with others. What if I miss a dose? Keep appointments for follow-up doses. It is important not to miss your dose. Call your health care provider if you are unable to keep an appointment. What may interact with this medication? This medicine may interact with the following medications: ketoconazole rifampin This list may not describe all possible interactions. Give your health care provider a list of all the medicines, herbs, non-prescription drugs, or dietary supplements you use. Also tell them if you smoke, drink alcohol, or use illegal drugs. Some items may interact with your medicine. What should I watch for while using this medication? Your condition will be monitored carefully while you are receiving this medicine. You may need blood work done while you are taking this medicine. You may get drowsy or dizzy. Do not drive, use machinery, or do anything that needs mental alertness until you know how this medicine affects you. Do not stand up or sit up quickly, especially if you are an older patient. This reduces the risk of dizzy or fainting spells This medicine may increase your risk of getting an infection. Call your health care provider for advice if you get a fever, chills, sore throat, or other symptoms of a cold or flu. Do not treat yourself. Try to avoid being around people who are sick. Check with your health care provider if you have severe  diarrhea, nausea, and vomiting, or if you sweat a lot. The loss of too much body fluid may make it dangerous for you to take this medicine. Do not become pregnant while taking this medicine or for 7 months after stopping it. Women should inform their health care provider if they wish to become pregnant or think they might be pregnant. Men should not father a child while taking this medicine and for 4 months after stopping it. There is a potential for serious harm to an unborn child. Talk to your health care provider for more information. Do not breast-feed an infant while taking this medicine or for 2 months after stopping it. This medicine may make it more difficult to get pregnant or father a child. Talk to your health care provider if you are concerned about your fertility. What side effects may I notice from receiving this medication? Side effects that you should report to your doctor or health care professional as soon as possible: allergic reactions (skin rash; itching or hives; swelling of the face, lips, or tongue) bleeding (bloody or black, tarry stools; red or dark brown urine; spitting up blood or brown material that looks like coffee grounds; red spots on the skin; unusual bruising or bleeding from the eye,  gums, or nose) blurred vision or changes in vision confusion constipation headache heart failure (trouble breathing; fast, irregular heartbeat; sudden weight gain; swelling of the ankles, feet, hands) infection (fever, chills, cough, sore throat, pain or trouble passing urine) lack or loss of appetite liver injury (dark yellow or brown urine; general ill feeling or flu-like symptoms; loss of appetite, right upper belly pain; yellowing of the eyes or skin) low blood pressure (dizziness; feeling faint or lightheaded, falls; unusually weak or tired) muscle cramps pain, redness, or irritation at site where injected pain, tingling, numbness in the hands or feet seizures trouble  breathing unusual bruising or bleeding Side effects that usually do not require medical attention (report to your doctor or health care professional if they continue or are bothersome): diarrhea nausea stomach pain trouble sleeping vomiting This list may not describe all possible side effects. Call your doctor for medical advice about side effects. You may report side effects to FDA at 1-800-FDA-1088. Where should I keep my medication? This medicine is given in a hospital or clinic. It will not be stored at home. NOTE: This sheet is a summary. It may not cover all possible information. If you have questions about this medicine, talk to your doctor, pharmacist, or health care provider.  2023 Elsevier/Gold Standard (2020-04-13 00:00:00)

## 2021-10-19 LAB — TYPE AND SCREEN
ABO/RH(D): O POS
Antibody Screen: NEGATIVE
Unit division: 0

## 2021-10-19 LAB — BPAM RBC
Blood Product Expiration Date: 202307172359
ISSUE DATE / TIME: 202306151400
Unit Type and Rh: 5100

## 2021-10-25 ENCOUNTER — Inpatient Hospital Stay (HOSPITAL_COMMUNITY): Payer: Medicare Other

## 2021-10-25 VITALS — BP 128/48 | HR 56 | Temp 96.6°F | Resp 18

## 2021-10-25 DIAGNOSIS — C9000 Multiple myeloma not having achieved remission: Secondary | ICD-10-CM | POA: Diagnosis not present

## 2021-10-25 DIAGNOSIS — D649 Anemia, unspecified: Secondary | ICD-10-CM

## 2021-10-25 DIAGNOSIS — Z5112 Encounter for antineoplastic immunotherapy: Secondary | ICD-10-CM | POA: Diagnosis not present

## 2021-10-25 DIAGNOSIS — D630 Anemia in neoplastic disease: Secondary | ICD-10-CM | POA: Diagnosis not present

## 2021-10-25 DIAGNOSIS — D6481 Anemia due to antineoplastic chemotherapy: Secondary | ICD-10-CM | POA: Diagnosis not present

## 2021-10-25 LAB — COMPREHENSIVE METABOLIC PANEL
ALT: 16 U/L (ref 0–44)
AST: 17 U/L (ref 15–41)
Albumin: 3.1 g/dL — ABNORMAL LOW (ref 3.5–5.0)
Alkaline Phosphatase: 64 U/L (ref 38–126)
Anion gap: 4 — ABNORMAL LOW (ref 5–15)
BUN: 26 mg/dL — ABNORMAL HIGH (ref 8–23)
CO2: 24 mmol/L (ref 22–32)
Calcium: 8.9 mg/dL (ref 8.9–10.3)
Chloride: 107 mmol/L (ref 98–111)
Creatinine, Ser: 0.82 mg/dL (ref 0.61–1.24)
GFR, Estimated: >60 mL/min (ref >60)
Glucose, Bld: 104 mg/dL — ABNORMAL HIGH (ref 70–99)
Potassium: 4.1 mmol/L (ref 3.5–5.1)
Sodium: 135 mmol/L (ref 135–145)
Total Bilirubin: 0.6 mg/dL (ref 0.3–1.2)
Total Protein: 8.3 g/dL — ABNORMAL HIGH (ref 6.5–8.1)

## 2021-10-25 LAB — CBC WITH DIFFERENTIAL/PLATELET
Abs Immature Granulocytes: 0 10*3/uL (ref 0.00–0.07)
Basophils Absolute: 0 10*3/uL (ref 0.0–0.1)
Basophils Relative: 0 %
Eosinophils Absolute: 0.3 10*3/uL (ref 0.0–0.5)
Eosinophils Relative: 8 %
HCT: 23.7 % — ABNORMAL LOW (ref 39.0–52.0)
Hemoglobin: 7.7 g/dL — ABNORMAL LOW (ref 13.0–17.0)
Immature Granulocytes: 0 %
Lymphocytes Relative: 24 %
Lymphs Abs: 0.9 10*3/uL (ref 0.7–4.0)
MCH: 34.2 pg — ABNORMAL HIGH (ref 26.0–34.0)
MCHC: 32.5 g/dL (ref 30.0–36.0)
MCV: 105.3 fL — ABNORMAL HIGH (ref 80.0–100.0)
Monocytes Absolute: 0.8 10*3/uL (ref 0.1–1.0)
Monocytes Relative: 19 %
Neutro Abs: 1.9 10*3/uL (ref 1.7–7.7)
Neutrophils Relative %: 49 %
Platelets: 92 10*3/uL — ABNORMAL LOW (ref 150–400)
RBC: 2.25 MIL/uL — ABNORMAL LOW (ref 4.22–5.81)
RDW: 23.1 % — ABNORMAL HIGH (ref 11.5–15.5)
WBC: 4 10*3/uL (ref 4.0–10.5)
nRBC: 0 % (ref 0.0–0.2)

## 2021-10-25 LAB — MAGNESIUM: Magnesium: 2.1 mg/dL (ref 1.7–2.4)

## 2021-10-25 LAB — SAMPLE TO BLOOD BANK

## 2021-10-25 MED ORDER — BORTEZOMIB CHEMO SQ INJECTION 3.5 MG (2.5MG/ML)
1.0000 mg/m2 | Freq: Once | INTRAMUSCULAR | Status: AC
  Administered 2021-10-25: 1.75 mg via SUBCUTANEOUS
  Filled 2021-10-25: qty 0.7

## 2021-10-25 NOTE — Progress Notes (Signed)
Patient presents today for Velcade injection.  Patient is in satisfactory condition with no new complaints voiced.  Vital signs are stable.  Labs reviewed and all labs are within treatment parameters.  We will proceed with Velcade injection per MD orders.   Patient tolerated Velcade injection with no complaints voiced.  Site clean and dry with no bruising or swelling noted.  No complaints of pain.  Discharged with vital signs stable and no signs or symptoms of distress noted.

## 2021-10-25 NOTE — Patient Instructions (Signed)
[facility]  Discharge Instructions: Thank you for choosing [facility] to provide your oncology and hematology care.  If you have a lab appointment with the [facility], please come in thru the Main Entrance and check in at the main information desk.  Wear comfortable clothing and clothing appropriate for easy access to any Portacath or PICC line.   We strive to give you quality time with your provider. You may need to reschedule your appointment if you arrive late (15 or more minutes).  Arriving late affects you and other patients whose appointments are after yours.  Also, if you miss three or more appointments without notifying the office, you may be dismissed from the clinic at the provider's discretion.      For prescription refill requests, have your pharmacy contact our office and allow 72 hours for refills to be completed.    Today you received the following chemotherapy and/or immunotherapy agents Velcade.  Bortezomib injection What is this medication? BORTEZOMIB (bor TEZ oh mib) targets proteins in cancer cells and stops the cancer cells from growing. It treats multiple myeloma and mantle cell lymphoma. This medicine may be used for other purposes; ask your health care provider or pharmacist if you have questions. COMMON BRAND NAME(S): Velcade What should I tell my care team before I take this medication? They need to know if you have any of these conditions: dehydration diabetes (high blood sugar) heart disease liver disease tingling of the fingers or toes or other nerve disorder an unusual or allergic reaction to bortezomib, mannitol, boron, other medicines, foods, dyes, or preservatives pregnant or trying to get pregnant breast-feeding How should I use this medication? This medicine is injected into a vein or under the skin. It is given by a health care provider in a hospital or clinic setting. Talk to your health care provider about the use of  this medicine in children. Special care may be needed. Overdosage: If you think you have taken too much of this medicine contact a poison control center or emergency room at once. NOTE: This medicine is only for you. Do not share this medicine with others. What if I miss a dose? Keep appointments for follow-up doses. It is important not to miss your dose. Call your health care provider if you are unable to keep an appointment. What may interact with this medication? This medicine may interact with the following medications: ketoconazole rifampin This list may not describe all possible interactions. Give your health care provider a list of all the medicines, herbs, non-prescription drugs, or dietary supplements you use. Also tell them if you smoke, drink alcohol, or use illegal drugs. Some items may interact with your medicine. What should I watch for while using this medication? Your condition will be monitored carefully while you are receiving this medicine. You may need blood work done while you are taking this medicine. You may get drowsy or dizzy. Do not drive, use machinery, or do anything that needs mental alertness until you know how this medicine affects you. Do not stand up or sit up quickly, especially if you are an older patient. This reduces the risk of dizzy or fainting spells This medicine may increase your risk of getting an infection. Call your health care provider for advice if you get a fever, chills, sore throat, or other symptoms of a cold or flu. Do not treat yourself. Try to avoid being around people who are sick. Check with your health care provider  if you have severe diarrhea, nausea, and vomiting, or if you sweat a lot. The loss of too much body fluid may make it dangerous for you to take this medicine. Do not become pregnant while taking this medicine or for 7 months after stopping it. Women should inform their health care provider if they wish to become pregnant or think  they might be pregnant. Men should not father a child while taking this medicine and for 4 months after stopping it. There is a potential for serious harm to an unborn child. Talk to your health care provider for more information. Do not breast-feed an infant while taking this medicine or for 2 months after stopping it. This medicine may make it more difficult to get pregnant or father a child. Talk to your health care provider if you are concerned about your fertility. What side effects may I notice from receiving this medication? Side effects that you should report to your doctor or health care professional as soon as possible: allergic reactions (skin rash; itching or hives; swelling of the face, lips, or tongue) bleeding (bloody or black, tarry stools; red or dark Bettyjean Stefanski urine; spitting up blood or Barbara Keng material that looks like coffee grounds; red spots on the skin; unusual bruising or bleeding from the eye, gums, or nose) blurred vision or changes in vision confusion constipation headache heart failure (trouble breathing; fast, irregular heartbeat; sudden weight gain; swelling of the ankles, feet, hands) infection (fever, chills, cough, sore throat, pain or trouble passing urine) lack or loss of appetite liver injury (dark yellow or Disa Riedlinger urine; general ill feeling or flu-like symptoms; loss of appetite, right upper belly pain; yellowing of the eyes or skin) low blood pressure (dizziness; feeling faint or lightheaded, falls; unusually weak or tired) muscle cramps pain, redness, or irritation at site where injected pain, tingling, numbness in the hands or feet seizures trouble breathing unusual bruising or bleeding Side effects that usually do not require medical attention (report to your doctor or health care professional if they continue or are bothersome): diarrhea nausea stomach pain trouble sleeping vomiting This list may not describe all possible side effects. Call your doctor  for medical advice about side effects. You may report side effects to FDA at 1-800-FDA-1088. Where should I keep my medication? This medicine is given in a hospital or clinic. It will not be stored at home. NOTE: This sheet is a summary. It may not cover all possible information. If you have questions about this medicine, talk to your doctor, pharmacist, or health care provider.  2023 Elsevier/Gold Standard (2020-04-13 00:00:00)        To help prevent nausea and vomiting after your treatment, we encourage you to take your nausea medication as directed.  BELOW ARE SYMPTOMS THAT SHOULD BE REPORTED IMMEDIATELY: *FEVER GREATER THAN 100.4 F (38 C) OR HIGHER *CHILLS OR SWEATING *NAUSEA AND VOMITING THAT IS NOT CONTROLLED WITH YOUR NAUSEA MEDICATION *UNUSUAL SHORTNESS OF BREATH *UNUSUAL BRUISING OR BLEEDING *URINARY PROBLEMS (pain or burning when urinating, or frequent urination) *BOWEL PROBLEMS (unusual diarrhea, constipation, pain near the anus) TENDERNESS IN MOUTH AND THROAT WITH OR WITHOUT PRESENCE OF ULCERS (sore throat, sores in mouth, or a toothache) UNUSUAL RASH, SWELLING OR PAIN  UNUSUAL VAGINAL DISCHARGE OR ITCHING   Items with * indicate a potential emergency and should be followed up as soon as possible or go to the Emergency Department if any problems should occur.  Please show the CHEMOTHERAPY ALERT CARD or IMMUNOTHERAPY ALERT CARD at  check-in to the Emergency Department and triage nurse.  Should you have questions after your visit or need to cancel or reschedule your appointment, please contact [facility] [phone]  and follow the prompts.  Office hours are 8:00 a.m. to 4:30 p.m. Monday - Friday. Please note that voicemails left after 4:00 p.m. may not be returned until the following business day.  We are closed weekends and major holidays. You have access to a nurse at all times for urgent questions. Please call the main number to the clinic [phone] and  follow the prompts.  For any non-urgent questions, you may also contact your provider using MyChart. We now offer e-Visits for anyone 34 and older to request care online for non-urgent symptoms. For details visit [email or web address].   Also download the MyChart app! Go to the app store, search "MyChart", open the app, select [facility], and log in with your MyChart username and password.  Masks are optional in the cancer centers. If you would like for your care team to wear a mask while they are taking care of you, please let them know. For doctor visits, patients may have with them one support person who is at least 86 years old. At this time, visitors are not allowed in the infusion area.

## 2021-11-01 ENCOUNTER — Inpatient Hospital Stay (HOSPITAL_COMMUNITY): Payer: Medicare Other

## 2021-11-01 ENCOUNTER — Encounter (HOSPITAL_COMMUNITY): Payer: Self-pay

## 2021-11-01 VITALS — BP 134/59 | HR 67 | Temp 98.8°F | Resp 18 | Wt 126.8 lb

## 2021-11-01 DIAGNOSIS — D649 Anemia, unspecified: Secondary | ICD-10-CM

## 2021-11-01 DIAGNOSIS — C9000 Multiple myeloma not having achieved remission: Secondary | ICD-10-CM

## 2021-11-01 DIAGNOSIS — D630 Anemia in neoplastic disease: Secondary | ICD-10-CM | POA: Diagnosis not present

## 2021-11-01 DIAGNOSIS — D696 Thrombocytopenia, unspecified: Secondary | ICD-10-CM

## 2021-11-01 DIAGNOSIS — D6481 Anemia due to antineoplastic chemotherapy: Secondary | ICD-10-CM | POA: Diagnosis not present

## 2021-11-01 DIAGNOSIS — Z5112 Encounter for antineoplastic immunotherapy: Secondary | ICD-10-CM | POA: Diagnosis not present

## 2021-11-01 LAB — CBC WITH DIFFERENTIAL/PLATELET
Abs Immature Granulocytes: 0.02 10*3/uL (ref 0.00–0.07)
Basophils Absolute: 0 10*3/uL (ref 0.0–0.1)
Basophils Relative: 0 %
Eosinophils Absolute: 0.4 10*3/uL (ref 0.0–0.5)
Eosinophils Relative: 8 %
HCT: 21.5 % — ABNORMAL LOW (ref 39.0–52.0)
Hemoglobin: 7 g/dL — ABNORMAL LOW (ref 13.0–17.0)
Immature Granulocytes: 0 %
Lymphocytes Relative: 18 %
Lymphs Abs: 1 10*3/uL (ref 0.7–4.0)
MCH: 34.8 pg — ABNORMAL HIGH (ref 26.0–34.0)
MCHC: 32.6 g/dL (ref 30.0–36.0)
MCV: 107 fL — ABNORMAL HIGH (ref 80.0–100.0)
Monocytes Absolute: 0.7 10*3/uL (ref 0.1–1.0)
Monocytes Relative: 12 %
Neutro Abs: 3.5 10*3/uL (ref 1.7–7.7)
Neutrophils Relative %: 62 %
RBC: 2.01 MIL/uL — ABNORMAL LOW (ref 4.22–5.81)
RDW: 23.4 % — ABNORMAL HIGH (ref 11.5–15.5)
WBC: 5.7 10*3/uL (ref 4.0–10.5)
nRBC: 0 % (ref 0.0–0.2)

## 2021-11-01 LAB — COMPREHENSIVE METABOLIC PANEL
ALT: 17 U/L (ref 0–44)
AST: 17 U/L (ref 15–41)
Albumin: 3 g/dL — ABNORMAL LOW (ref 3.5–5.0)
Alkaline Phosphatase: 67 U/L (ref 38–126)
Anion gap: 5 (ref 5–15)
BUN: 28 mg/dL — ABNORMAL HIGH (ref 8–23)
CO2: 23 mmol/L (ref 22–32)
Calcium: 8.8 mg/dL — ABNORMAL LOW (ref 8.9–10.3)
Chloride: 107 mmol/L (ref 98–111)
Creatinine, Ser: 0.96 mg/dL (ref 0.61–1.24)
GFR, Estimated: >60 mL/min (ref >60)
Glucose, Bld: 125 mg/dL — ABNORMAL HIGH (ref 70–99)
Potassium: 3.9 mmol/L (ref 3.5–5.1)
Sodium: 135 mmol/L (ref 135–145)
Total Bilirubin: 0.6 mg/dL (ref 0.3–1.2)
Total Protein: 7.8 g/dL (ref 6.5–8.1)

## 2021-11-01 LAB — TYPE AND SCREEN
ABO/RH(D): O POS
Antibody Screen: NEGATIVE

## 2021-11-01 LAB — MAGNESIUM: Magnesium: 2.1 mg/dL (ref 1.7–2.4)

## 2021-11-01 MED ORDER — BORTEZOMIB CHEMO SQ INJECTION 3.5 MG (2.5MG/ML)
1.0000 mg/m2 | Freq: Once | INTRAMUSCULAR | Status: AC
  Administered 2021-11-01: 1.75 mg via SUBCUTANEOUS
  Filled 2021-11-01: qty 0.7

## 2021-11-01 NOTE — Progress Notes (Signed)
Called down to lab to get lab result on WBC , its 5.47 per Lab tech. Ok to treat.

## 2021-11-01 NOTE — Patient Instructions (Signed)
[facility]  Discharge Instructions: Thank you for choosing [facility] to provide your oncology and hematology care.  If you have a lab appointment with the Cancer Center, please come in thru the Main Entrance and check in at the main information desk.  Wear comfortable clothing and clothing appropriate for easy access to any Portacath or PICC line.   We strive to give you quality time with your provider. You may need to reschedule your appointment if you arrive late (15 or more minutes).  Arriving late affects you and other patients whose appointments are after yours.  Also, if you miss three or more appointments without notifying the office, you may be dismissed from the clinic at the provider's discretion.      For prescription refill requests, have your pharmacy contact our office and allow 72 hours for refills to be completed.    Today you received the following chemotherapy and/or immunotherapy agents Velcade.  Bortezomib injection What is this medication? BORTEZOMIB (bor TEZ oh mib) targets proteins in cancer cells and stops the cancer cells from growing. It treats multiple myeloma and mantle cell lymphoma. This medicine may be used for other purposes; ask your health care provider or pharmacist if you have questions. COMMON BRAND NAME(S): Velcade What should I tell my care team before I take this medication? They need to know if you have any of these conditions: dehydration diabetes (high blood sugar) heart disease liver disease tingling of the fingers or toes or other nerve disorder an unusual or allergic reaction to bortezomib, mannitol, boron, other medicines, foods, dyes, or preservatives pregnant or trying to get pregnant breast-feeding How should I use this medication? This medicine is injected into a vein or under the skin. It is given by a health care provider in a hospital or clinic setting. Talk to your health care provider about the use of  this medicine in children. Special care may be needed. Overdosage: If you think you have taken too much of this medicine contact a poison control center or emergency room at once. NOTE: This medicine is only for you. Do not share this medicine with others. What if I miss a dose? Keep appointments for follow-up doses. It is important not to miss your dose. Call your health care provider if you are unable to keep an appointment. What may interact with this medication? This medicine may interact with the following medications: ketoconazole rifampin This list may not describe all possible interactions. Give your health care provider a list of all the medicines, herbs, non-prescription drugs, or dietary supplements you use. Also tell them if you smoke, drink alcohol, or use illegal drugs. Some items may interact with your medicine. What should I watch for while using this medication? Your condition will be monitored carefully while you are receiving this medicine. You may need blood work done while you are taking this medicine. You may get drowsy or dizzy. Do not drive, use machinery, or do anything that needs mental alertness until you know how this medicine affects you. Do not stand up or sit up quickly, especially if you are an older patient. This reduces the risk of dizzy or fainting spells This medicine may increase your risk of getting an infection. Call your health care provider for advice if you get a fever, chills, sore throat, or other symptoms of a cold or flu. Do not treat yourself. Try to avoid being around people who are sick. Check with your health care provider   if you have severe diarrhea, nausea, and vomiting, or if you sweat a lot. The loss of too much body fluid may make it dangerous for you to take this medicine. Do not become pregnant while taking this medicine or for 7 months after stopping it. Women should inform their health care provider if they wish to become pregnant or think  they might be pregnant. Men should not father a child while taking this medicine and for 4 months after stopping it. There is a potential for serious harm to an unborn child. Talk to your health care provider for more information. Do not breast-feed an infant while taking this medicine or for 2 months after stopping it. This medicine may make it more difficult to get pregnant or father a child. Talk to your health care provider if you are concerned about your fertility. What side effects may I notice from receiving this medication? Side effects that you should report to your doctor or health care professional as soon as possible: allergic reactions (skin rash; itching or hives; swelling of the face, lips, or tongue) bleeding (bloody or black, tarry stools; red or dark Dale [name] urine; spitting up blood or Dale [name] material that looks like coffee grounds; red spots on the skin; unusual bruising or bleeding from the eye, gums, or nose) blurred vision or changes in vision confusion constipation headache heart failure (trouble breathing; fast, irregular heartbeat; sudden weight gain; swelling of the ankles, feet, hands) infection (fever, chills, cough, sore throat, pain or trouble passing urine) lack or loss of appetite liver injury (dark yellow or Glendale Youngblood urine; general ill feeling or flu-like symptoms; loss of appetite, right upper belly pain; yellowing of the eyes or skin) low blood pressure (dizziness; feeling faint or lightheaded, falls; unusually weak or tired) muscle cramps pain, redness, or irritation at site where injected pain, tingling, numbness in the hands or feet seizures trouble breathing unusual bruising or bleeding Side effects that usually do not require medical attention (report to your doctor or health care professional if they continue or are bothersome): diarrhea nausea stomach pain trouble sleeping vomiting This list may not describe all possible side effects. Call your doctor  for medical advice about side effects. You may report side effects to FDA at 1-800-FDA-1088. Where should I keep my medication? This medicine is given in a hospital or clinic. It will not be stored at home. NOTE: This sheet is a summary. It may not cover all possible information. If you have questions about this medicine, talk to your doctor, pharmacist, or health care provider.  2023 Elsevier/Gold Standard (2020-04-13 00:00:00)   To help prevent nausea and vomiting after your treatment, we encourage you to take your nausea medication as directed.  BELOW ARE SYMPTOMS THAT SHOULD BE REPORTED IMMEDIATELY: *FEVER GREATER THAN 100.4 F (38 C) OR HIGHER *CHILLS OR SWEATING *NAUSEA AND VOMITING THAT IS NOT CONTROLLED WITH YOUR NAUSEA MEDICATION *UNUSUAL SHORTNESS OF BREATH *UNUSUAL BRUISING OR BLEEDING *URINARY PROBLEMS (pain or burning when urinating, or frequent urination) *BOWEL PROBLEMS (unusual diarrhea, constipation, pain near the anus) TENDERNESS IN MOUTH AND THROAT WITH OR WITHOUT PRESENCE OF ULCERS (sore throat, sores in mouth, or a toothache) UNUSUAL RASH, SWELLING OR PAIN  UNUSUAL VAGINAL DISCHARGE OR ITCHING   Items with * indicate a potential emergency and should be followed up as soon as possible or go to the Emergency Department if any problems should occur.  Please show the CHEMOTHERAPY ALERT CARD or IMMUNOTHERAPY ALERT CARD at check-in to the Emergency Department   and triage nurse.  Should you have questions after your visit or need to cancel or reschedule your appointment, please contact [facility] [phone]  and follow the prompts.  Office hours are 8:00 a.m. to 4:30 p.m. Monday - Friday. Please note that voicemails left after 4:00 p.m. may not be returned until the following business day.  We are closed weekends and major holidays. You have access to a nurse at all times for urgent questions. Please call the main number to the clinic [phone] and follow the  prompts.  For any non-urgent questions, you may also contact your provider using MyChart. We now offer e-Visits for anyone 18 and older to request care online for non-urgent symptoms. For details visit [email or web address].   Also download the MyChart app! Go to the app store, search "MyChart", open the app, select [facility], and log in with your MyChart username and password.  Masks are optional in the cancer centers. If you would like for your care team to wear a mask while they are taking care of you, please let them know. For doctor visits, patients may have with them one support person who is at least 86 years old. At this time, visitors are not allowed in the infusion area.  

## 2021-11-01 NOTE — Progress Notes (Signed)
Patient presents today for Velcade injection.  Patient is in satisfactory condition with only complaints of hip pain.  Vital signs are stable.  Labs reviewed.  Hgb today is 7.0.  CMP was not drawn today and Dr. Delton Coombes gave the ok to use the one drawn on 10/25/2021.  All labs are within treatment parameters.  We will proceed with injection per MD orders.    Patient tolerated injection with no complaints voiced.  Site clean and dry with no bruising or swelling noted.  No complaints of pain.  Discharged with vital signs stable and no signs or symptoms of distress noted.

## 2021-11-02 LAB — KAPPA/LAMBDA LIGHT CHAINS
Kappa free light chain: 12 mg/L (ref 3.3–19.4)
Kappa, lambda light chain ratio: 0.03 — ABNORMAL LOW (ref 0.26–1.65)
Lambda free light chains: 415.5 mg/L — ABNORMAL HIGH (ref 5.7–26.3)

## 2021-11-06 LAB — PROTEIN ELECTROPHORESIS, SERUM
A/G Ratio: 0.8 (ref 0.7–1.7)
Albumin ELP: 3.6 g/dL (ref 2.9–4.4)
Alpha-1-Globulin: 0.2 g/dL (ref 0.0–0.4)
Alpha-2-Globulin: 0.6 g/dL (ref 0.4–1.0)
Beta Globulin: 0.6 g/dL — ABNORMAL LOW (ref 0.7–1.3)
Gamma Globulin: 3 g/dL — ABNORMAL HIGH (ref 0.4–1.8)
Globulin, Total: 4.5 g/dL — ABNORMAL HIGH (ref 2.2–3.9)
M-Spike, %: 2.9 g/dL — ABNORMAL HIGH
Total Protein ELP: 8.1 g/dL (ref 6.0–8.5)

## 2021-11-08 ENCOUNTER — Inpatient Hospital Stay (HOSPITAL_COMMUNITY): Payer: Medicare Other

## 2021-11-08 ENCOUNTER — Other Ambulatory Visit (HOSPITAL_COMMUNITY): Payer: Self-pay | Admitting: *Deleted

## 2021-11-08 ENCOUNTER — Inpatient Hospital Stay (HOSPITAL_COMMUNITY): Payer: Medicare Other | Attending: Hematology

## 2021-11-08 ENCOUNTER — Encounter (HOSPITAL_COMMUNITY): Payer: Self-pay

## 2021-11-08 ENCOUNTER — Inpatient Hospital Stay (HOSPITAL_BASED_OUTPATIENT_CLINIC_OR_DEPARTMENT_OTHER): Payer: Medicare Other | Admitting: Hematology

## 2021-11-08 VITALS — BP 140/55 | HR 67 | Temp 98.3°F | Resp 17 | Ht 67.72 in | Wt 126.4 lb

## 2021-11-08 VITALS — BP 150/55 | HR 87 | Temp 97.0°F | Resp 18 | Wt 126.2 lb

## 2021-11-08 DIAGNOSIS — D696 Thrombocytopenia, unspecified: Secondary | ICD-10-CM

## 2021-11-08 DIAGNOSIS — C9000 Multiple myeloma not having achieved remission: Secondary | ICD-10-CM | POA: Insufficient documentation

## 2021-11-08 DIAGNOSIS — Z5112 Encounter for antineoplastic immunotherapy: Secondary | ICD-10-CM | POA: Insufficient documentation

## 2021-11-08 DIAGNOSIS — D6481 Anemia due to antineoplastic chemotherapy: Secondary | ICD-10-CM | POA: Diagnosis not present

## 2021-11-08 DIAGNOSIS — Z7982 Long term (current) use of aspirin: Secondary | ICD-10-CM | POA: Insufficient documentation

## 2021-11-08 DIAGNOSIS — D649 Anemia, unspecified: Secondary | ICD-10-CM

## 2021-11-08 LAB — CBC WITH DIFFERENTIAL/PLATELET
Abs Immature Granulocytes: 0.03 10*3/uL (ref 0.00–0.07)
Basophils Absolute: 0 10*3/uL (ref 0.0–0.1)
Basophils Relative: 0 %
Eosinophils Absolute: 0.2 10*3/uL (ref 0.0–0.5)
Eosinophils Relative: 4 %
HCT: 20.6 % — ABNORMAL LOW (ref 39.0–52.0)
Hemoglobin: 6.9 g/dL — CL (ref 13.0–17.0)
Immature Granulocytes: 1 %
Lymphocytes Relative: 23 %
Lymphs Abs: 1 10*3/uL (ref 0.7–4.0)
MCH: 35.8 pg — ABNORMAL HIGH (ref 26.0–34.0)
MCHC: 33.5 g/dL (ref 30.0–36.0)
MCV: 106.7 fL — ABNORMAL HIGH (ref 80.0–100.0)
Monocytes Absolute: 0.6 10*3/uL (ref 0.1–1.0)
Monocytes Relative: 14 %
Neutro Abs: 2.5 10*3/uL (ref 1.7–7.7)
Neutrophils Relative %: 58 %
Platelets: 101 10*3/uL — ABNORMAL LOW (ref 150–400)
RBC: 1.93 MIL/uL — ABNORMAL LOW (ref 4.22–5.81)
RDW: 23.8 % — ABNORMAL HIGH (ref 11.5–15.5)
WBC: 4.3 10*3/uL (ref 4.0–10.5)
nRBC: 0 % (ref 0.0–0.2)

## 2021-11-08 LAB — COMPREHENSIVE METABOLIC PANEL
ALT: 19 U/L (ref 0–44)
AST: 18 U/L (ref 15–41)
Albumin: 3 g/dL — ABNORMAL LOW (ref 3.5–5.0)
Alkaline Phosphatase: 60 U/L (ref 38–126)
Anion gap: 5 (ref 5–15)
BUN: 32 mg/dL — ABNORMAL HIGH (ref 8–23)
CO2: 23 mmol/L (ref 22–32)
Calcium: 8.7 mg/dL — ABNORMAL LOW (ref 8.9–10.3)
Chloride: 105 mmol/L (ref 98–111)
Creatinine, Ser: 0.94 mg/dL (ref 0.61–1.24)
GFR, Estimated: >60 mL/min (ref >60)
Glucose, Bld: 114 mg/dL — ABNORMAL HIGH (ref 70–99)
Potassium: 4.3 mmol/L (ref 3.5–5.1)
Sodium: 133 mmol/L — ABNORMAL LOW (ref 135–145)
Total Bilirubin: 0.5 mg/dL (ref 0.3–1.2)
Total Protein: 8.6 g/dL — ABNORMAL HIGH (ref 6.5–8.1)

## 2021-11-08 LAB — SAMPLE TO BLOOD BANK

## 2021-11-08 LAB — MAGNESIUM: Magnesium: 2.3 mg/dL (ref 1.7–2.4)

## 2021-11-08 LAB — PREPARE RBC (CROSSMATCH)

## 2021-11-08 MED ORDER — BORTEZOMIB CHEMO SQ INJECTION 3.5 MG (2.5MG/ML)
1.0000 mg/m2 | Freq: Once | INTRAMUSCULAR | Status: AC
  Administered 2021-11-08: 1.75 mg via SUBCUTANEOUS
  Filled 2021-11-08: qty 0.7

## 2021-11-08 MED ORDER — ACYCLOVIR 400 MG PO TABS: 400.0000 mg | ORAL_TABLET | Freq: Two times a day (BID) | ORAL | 0 refills | Status: DC

## 2021-11-08 MED ORDER — DEXAMETHASONE 2 MG PO TABS: ORAL_TABLET | ORAL | 0 refills | Status: DC

## 2021-11-08 NOTE — Progress Notes (Signed)
[place], [place] [address]   CLINIC:  Medical Oncology/Hematology  PCP:  [facility], [place] / [place] [address] [phone]   REASON FOR VISIT:  Follow-up for multiple myeloma and anemia  PRIOR THERAPY: none  CURRENT THERAPY: VRd weekly q21d  BRIEF ONCOLOGIC HISTORY:  Oncology History  Multiple myeloma ([facility])  01/15/2021 Initial Diagnosis   Multiple myeloma ([facility])   06/25/2021 -  Chemotherapy   Patient is on Treatment Plan : MYELOMA NON-TRANSPLANT CANDIDATES VRd weekly q21d       CANCER STAGING:  Cancer Staging  No matching staging information was found for the patient.  INTERVAL HISTORY:  [name], a 88 y.o. male, returns for routine follow-up and consideration for next cycle of chemotherapy. [place] was last seen on 10/04/2021.  Due for cycle #7 of VRd today.   Overall, he tells me he has been feeling pretty well. He reports left should and bilateral hip pain; his left hip pain is worse. This pain started 2 days ago. He denies diarrhea. He takes Imodium prn, and he has only required it 2 times since his last visit. He reports occasional leg weakness. He denies tingling/numbness, headaches, and light-headedness.  Overall, he feels ready for next cycle of chemo today.    REVIEW OF SYSTEMS:  Review of Systems  Constitutional:  Negative for appetite change and fatigue.  Gastrointestinal:  Negative for diarrhea.  Musculoskeletal:  Positive for arthralgias (6/10 L shoulder + L hip).  Neurological:  Positive for extremity weakness (legs). Negative for headaches, light-headedness and numbness.  All other systems reviewed and are negative.   PAST MEDICAL/SURGICAL HISTORY:  Past Medical History:  Diagnosis Date   Allergic rhinitis    Anal fissure    Anginal pain (HCC)    Asthma    Cancer (HCC)    skin   Chest pain    CHF (congestive heart failure) ([facility])    Coronary heart disease    s/p  stenting. cath in 01/2012 noncritical occlusion   Dysrhythmia    1st degree heart block   GERD (gastroesophageal reflux disease)    Glaucoma    Hiatal hernia    Hyperlipidemia    Hypertension    Hypothyroidism    Idiopathic thrombocytopenic purpura ([facility]) 2002   Macular degeneration    Nephrolithiasis    PUD (peptic ulcer disease)    remote   Sarcoidosis    pulmonary   Schatzki's ring    Past Surgical History:  Procedure Laterality Date   cardiac stents     COLONOSCOPY  10/30/2006   Normal rectum, sigmoid diverticula.Remainder of colonic mucosa appeared normal.   CORONARY ANGIOPLASTY WITH STENT PLACEMENT     about 10 years ago per pt (around 2007)   [place], URETEROSCOPY AND STENT PLACEMENT Left 06/16/2017   Procedure: CYSTOSCOPY WITH RETROGRADE PYELOGRAM, URETEROSCOPY,STONE EXTRACTION  AND STENT PLACEMENT;  Surgeon: Franchot Gallo, MD;  Location: [facility];  Service: Urology;  Laterality: Left;   ESOPHAGOGASTRODUODENOSCOPY  06/19/2004   Two esophageal rings and esophageal web as described above.  All of these were disrupted by passing 56-French Venia Minks dilator/ Candida esophagitis,which appears to be incidental given history of   antibiotic use, but nevertheless will be treated.   ESOPHAGOGASTRODUODENOSCOPY  10/30/2006   Distal tandem esophageal ring status post dilation disruption as  described above.  Otherwise normal esophagus/  Small hiatal hernia otherwise normal stomach, D1 and D2   ESOPHAGOGASTRODUODENOSCOPY  N/A 03/22/2015   Dr.Rourk- noncritical schatzki's ring and hiatal hernia-o/w normal EGD.    ESOPHAGOGASTRODUODENOSCOPY (EGD) WITH ESOPHAGEAL DILATION  03/04/2012   RMR- schatzki's ring, hiatal hernia, polypoid gastric mucosa, bx= minimally active gastritis.   HOLMIUM LASER APPLICATION Left 12/14/9145   Procedure: HOLMIUM LASER APPLICATION;  Surgeon: Franchot Gallo, MD;  Location: [facility];  Service: Urology;  Laterality: Left;   IR GENERIC  HISTORICAL  03/06/2016   IR RADIOLOGIST EVAL & MGMT 03/06/2016 Aletta Edouard, MD [facility] INTERV RAD   IR GENERIC HISTORICAL  06/18/2016   IR RADIOLOGIST EVAL & MGMT 06/18/2016 Aletta Edouard, MD [facility] INTERV RAD   IR RADIOLOGIST EVAL & MGMT  10/01/2016   IR RADIOLOGIST EVAL & MGMT  10/15/2017   IR RADIOLOGIST EVAL & MGMT  12/24/2018   IR RADIOLOGIST EVAL & MGMT  01/04/2020   IR RADIOLOGIST EVAL & MGMT  06/20/2021   LEFT HEART CATH N/A 02/02/2012   Procedure: LEFT HEART CATH;  Surgeon: Lorretta Harp, MD;  Location: [facility] CATH LAB;  Service: Cardiovascular;  Laterality: N/A;   MEDIASTINOSCOPY     for dx sarcoid   RADIOLOGY WITH ANESTHESIA Left 05/17/2016   Procedure: left renal ablation;  Surgeon: Aletta Edouard, MD;  Location: [facility];  Service: Radiology;  Laterality: Left;    SOCIAL HISTORY:  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    Employer: RETIRED  Tobacco Use   Smoking status: Former    Packs/day: 0.10    Years: 2.00    Total pack years: 0.20    Types: Cigarettes, Cigars    Quit date: 05/06/1970    Years since quitting: 51.5   Smokeless tobacco: Never  Vaping Use   Vaping Use: Never used  Substance and Sexual Activity   Alcohol use: No    Alcohol/week: 0.0 standard drinks of alcohol   Drug use: No   Sexual activity: Never  Other Topics Concern   Not on file  Social History Narrative   Not on file   Social Determinants of Health   Financial Resource Strain: Not on file  Food Insecurity: No Food Insecurity (08/27/2021)   Hunger Vital Sign    Worried About Running Out of Food in the Last Year: Never true    Ran Out of Food in the Last Year: Never true  Transportation Needs: Not on file  Physical Activity: Not on file  Stress: Not on file   Social Connections: Not on file  Intimate Partner Violence: Not on file    FAMILY HISTORY:  Family History  Problem Relation Age of Onset   Heart disease Father        deceased age 60   Stroke Mother    Alzheimer's disease Mother    Heart attack Brother        deceased age 41   Cancer Other        niece   Colon cancer Neg Hx     CURRENT MEDICATIONS:  Current Outpatient Medications  Medication Sig Dispense Refill   acyclovir (ZOVIRAX) 400 MG tablet Take 1 tablet (400 mg total) by mouth 2 (two) times daily. 180 tablet 0   aspirin 81 MG tablet Take 81 mg by mouth every  Monday, Wednesday, and Friday.     atorvastatin (LIPITOR) 40 MG tablet TAKE 1 TABLET BY MOUTH IN  THE EVENING 90 tablet 3   Bortezomib (VELCADE IJ) Inject as directed. Three weeks on and one weeks off.     brimonidine (ALPHAGAN) 0.2 % ophthalmic solution Place 1 drop into both eyes 3 (three) times daily. Noon dosing only in right eye morning and evening in both eyes     dexamethasone (DECADRON) 2 MG tablet TAKE 5 TABLETS(10 MG) BY MOUTH 1 TIME A WEEK 60 tablet 0   dorzolamide-timolol (COSOPT) 22.3-6.8 MG/ML ophthalmic solution Place 1 drop into both eyes 2 (two) times daily.     furosemide (LASIX) 20 MG tablet If you notice any swelling, you may take 40 mg (two tablets) for the day. (Patient taking differently: Take 20 mg by mouth daily. If more swelling take an additional 20 mg in the afternoon) 90 tablet 3   isosorbide mononitrate (IMDUR) 60 MG 24 hr tablet TAKE 1 AND 1/2 TABLETS BY  MOUTH IN THE MORNING AND  1/2 TABLET AT NIGHT (Patient taking differently: TAKE 60 mg BY  MOUTH IN THE MORNING AND  30 mg TABLET AT NIGHT) 180 tablet 3   levothyroxine (SYNTHROID) 75 MCG tablet Take 75 mcg by mouth daily.     losartan (COZAAR) 100 MG tablet TAKE 1 TABLET BY MOUTH  DAILY 90 tablet 1   metoCLOPramide (REGLAN) 10 MG tablet Take 1 tablet (10 mg total) by mouth every 6 (six) hours as needed for nausea or vomiting. 15 tablet 0    metoprolol tartrate (LOPRESSOR) 25 MG tablet TAKE 1 TABLET BY MOUTH IN  THE MORNING AND ONE-HALF  TABLET BY MOUTH IN THE  EVENING 135 tablet 3   Multiple Vitamins-Minerals (PRESERVISION/LUTEIN) CAPS Take 1 capsule by mouth 2 (two) times daily.     nitroGLYCERIN (NITROSTAT) 0.4 MG SL tablet Place 1 tablet (0.4 mg total) under the tongue every 5 (five) minutes as needed. For chest pain 25 tablet 2   pantoprazole (PROTONIX) 40 MG tablet TAKE 1 TABLET BY MOUTH  DAILY BEFORE BREAKFAST 90 tablet 1   potassium chloride SA (KLOR-CON M) 20 MEQ tablet TAKE 1 TABLET BY MOUTH  DAILY 90 tablet 3   prochlorperazine (COMPAZINE) 10 MG tablet Take 1 tablet (10 mg total) by mouth every 6 (six) hours as needed for nausea or vomiting. 30 tablet 3   ROCKLATAN 0.02-0.005 % SOLN Apply 1 drop to eye at bedtime.     triamcinolone (KENALOG) 0.1 % cream Apply 1 application. topically 2 (two) times daily as needed (for irritation).     vitamin C (ASCORBIC ACID) 500 MG tablet Take 500 mg by mouth daily.     vitamin E 200 UNIT capsule Take 200 Units by mouth daily.     No current facility-administered medications for this visit.    ALLERGIES:  Allergies  Allergen Reactions   Azithromycin Other (See Comments)    Sore mouth and fever blisters around mouth, sores in nose area as well   Doxazosin Shortness Of Breath   Acetaminophen Other (See Comments)    REACTION: UNKNOWN REACTION   Atenolol Other (See Comments)    REACTION: UNKNOWN REACTION   Hydrocodone Nausea And Vomiting   Hydrocodone-Acetaminophen Nausea Only   Levofloxacin Other (See Comments)    Caused stomach problems.   Morphine Other (See Comments)    "made me crazy"   Penicillins Nausea And Vomiting and Other (See Comments)    Has patient  had a PCN reaction causing immediate rash, facial/tongue/throat swelling, SOB or lightheadedness with hypotension: No Has patient had a PCN reaction causing severe rash involving mucus membranes or skin necrosis:  No Has patient had a PCN reaction that required hospitalization No Has patient had a PCN reaction occurring within the last 10 years: No If all of the above answers are "NO", then may proceed with Cephalosporin use.    Sulfonamide Derivatives Nausea And Vomiting    PHYSICAL EXAM:  Performance status (ECOG): 0 - Asymptomatic  There were no vitals filed for this visit. Wt Readings from Last 3 Encounters:  11/01/21 126 lb 12.8 oz (57.5 kg)  10/18/21 126 lb 3.2 oz (57.2 kg)  10/11/21 130 lb (59 kg)   Physical Exam Vitals reviewed.  Constitutional:      Appearance: Normal appearance.  Cardiovascular:     Rate and Rhythm: Normal rate and regular rhythm.     Pulses: Normal pulses.     Heart sounds: Normal heart sounds.  Pulmonary:     Effort: Pulmonary effort is normal.     Breath sounds: Normal breath sounds.  Neurological:     General: No focal deficit present.     Mental Status: He is alert and oriented to person, place, and time.  Psychiatric:        Mood and Affect: Mood normal.        Behavior: Behavior normal.     LABORATORY DATA:  I have reviewed the labs as listed.     Latest Ref Rng & Units 11/01/2021   12:30 PM 10/25/2021   12:00 PM 10/18/2021   11:59 AM  CBC  WBC 4.0 - 10.5 K/uL 5.7  4.0  3.1   Hemoglobin 13.0 - 17.0 g/dL 7.0  7.7  6.5   Hematocrit 39.0 - 52.0 % 21.5  23.7  20.1   Platelets 150 - 400 K/uL DCLMP  92  92       Latest Ref Rng & Units 11/01/2021   12:30 PM 10/25/2021   12:00 PM 10/18/2021   11:59 AM  CMP  Glucose 70 - 99 mg/dL 125  104  90   BUN 8 - 23 mg/dL 28  26  36   Creatinine 0.61 - 1.24 mg/dL 0.96  0.82  1.01   Sodium 135 - 145 mmol/L 135  135  134   Potassium 3.5 - 5.1 mmol/L 3.9  4.1  3.9   Chloride 98 - 111 mmol/L 107  107  110   CO2 22 - 32 mmol/L '23  24  21   ' Calcium 8.9 - 10.3 mg/dL 8.8  8.9  8.8   Total Protein 6.5 - 8.1 g/dL 7.8  8.3  7.8   Total Bilirubin 0.3 - 1.2 mg/dL 0.6  0.6  0.7   Alkaline Phos 38 - 126 U/L 67  64  67    AST 15 - 41 U/L '17  17  18   ' ALT 0 - 44 U/L '17  16  15     ' DIAGNOSTIC IMAGING:  I have independently reviewed the scans and discussed with the patient. No results found.   ASSESSMENT:  1.  IgG lambda plasma cell myeloma: - Work-up for macrocytic anemia on 12/01/2020 showed M spike of 2.9 g.  Immunofixation IgG lambda. - Lambda light chains elevated at 284.  Light chain ratio was 0.04.  LDH was 163.  Creatinine was 0.9 and calcium 8.9. - Bone marrow biopsy on 12/18/2020-hypercellular marrow for age with  48% atypical plasma cells with lambda light chain restriction. - Chromosome analysis was normal. 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 - Revlimid 5 mg, 2 weeks on/1 week off started on 01/20/2021.  Dexamethasone weekly 10 mg added on 02/07/2021.  Revlimid is discontinued around 06/15/2021 due to poor tolerance and ineffectiveness at low-dose.  Thrombocytopenia and anemia. - Velcade weekly on days 1, 8, 15 every 21 days along with dexamethasone 10 mg weekly started on 06/25/2021.  2.  Macrocytic anemia: - CBC on 12/01/2020 with hemoglobin 8.5 and MCV of 117. - Denies any bleeding per rectum or melena.  3.  Social/family history: - Lives at home with his wife.  He does all ADLs and IADLs.  He even does yard work. - He worked at [employer] for 35 years.  Denies any chemical exposure.  Non-smoker. - No family history of malignancies.   PLAN:  1.  IgG lambda plasma cell myeloma, high risk: - After we have dose reduced Velcade to 1 mg/m2, diarrhea is much better controlled.  He is not requiring more than 1 to 2 tablets of Imodium once a week. - He is taking dexamethasone 5 pills on Sunday, total dose 10 mg weekly. - He denies any tingling or numbness in extremities.  No falls reported. - He complained of hip pains for the last couple of days. - Reviewed myeloma labs from 11/01/2021 which showed M spike 2.9 g,  increased from 2.5 g previously.  However lambda light chains have improved to 415 from 619 previously with ratio of 0.03. - I have recommended continuing Velcade and dexamethasone weekly and reevaluate him in 5 weeks with repeat myeloma labs 1 week prior.   2.  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 - Hemoglobin today 6.9.  He will receive 1 unit PRBC.  We will continue to monitor hemoglobin closely.  3.  Thromboprophylaxis: - Continue aspirin 81 mg daily.  4.  Right ankle swelling: - Continue Lasix as needed.  5.  ID prophylaxis: - Continue acyclovir twice daily for shingles prophylaxis.  Continue aspirin 81 mg daily.   Orders placed this encounter:  No orders of the defined types were placed in this encounter.    Derek Jack, MD [facility] [phone]   I, Thana Ates, am acting as a scribe for Dr. Derek Jack.  I, Derek Jack MD, have reviewed the above documentation for accuracy and completeness, and I agree with the above.

## 2021-11-08 NOTE — Progress Notes (Signed)
Labs reviewed with MD today. Ok to proceed with velcade. Patient is getting blood tomorrow  Treatment given per orders. Patient tolerated it well without problems. Vitals stable and discharged home from clinic ambulatory. Follow up as scheduled.

## 2021-11-08 NOTE — Patient Instructions (Signed)
[facility]  Discharge Instructions: Thank you for choosing [facility] to provide your oncology and hematology care.  If you have a lab appointment with the [facility], please come in thru the Main Entrance and check in at the main information desk.  Wear comfortable clothing and clothing appropriate for easy access to any Portacath or PICC line.   We strive to give you quality time with your provider. You may need to reschedule your appointment if you arrive late (15 or more minutes).  Arriving late affects you and other patients whose appointments are after yours.  Also, if you miss three or more appointments without notifying the office, you may be dismissed from the clinic at the provider's discretion.      For prescription refill requests, have your pharmacy contact our office and allow 72 hours for refills to be completed.    Today you received the following chemotherapy and/or immunotherapy agents       To help prevent nausea and vomiting after your treatment, we encourage you to take your nausea medication as directed.  BELOW ARE SYMPTOMS THAT SHOULD BE REPORTED IMMEDIATELY: *FEVER GREATER THAN 100.4 F (38 C) OR HIGHER *CHILLS OR SWEATING *NAUSEA AND VOMITING THAT IS NOT CONTROLLED WITH YOUR NAUSEA MEDICATION *UNUSUAL SHORTNESS OF BREATH *UNUSUAL BRUISING OR BLEEDING *URINARY PROBLEMS (pain or burning when urinating, or frequent urination) *BOWEL PROBLEMS (unusual diarrhea, constipation, pain near the anus) TENDERNESS IN MOUTH AND THROAT WITH OR WITHOUT PRESENCE OF ULCERS (sore throat, sores in mouth, or a toothache) UNUSUAL RASH, SWELLING OR PAIN  UNUSUAL VAGINAL DISCHARGE OR ITCHING   Items with * indicate a potential emergency and should be followed up as soon as possible or go to the Emergency Department if any problems should occur.  Please show the CHEMOTHERAPY ALERT CARD or IMMUNOTHERAPY ALERT CARD at check-in to the Emergency Department and  triage nurse.  Should you have questions after your visit or need to cancel or reschedule your appointment, please contact [facility] [phone]  and follow the prompts.  Office hours are 8:00 a.m. to 4:30 p.m. Monday - Friday. Please note that voicemails left after 4:00 p.m. may not be returned until the following business day.  We are closed weekends and major holidays. You have access to a nurse at all times for urgent questions. Please call the main number to the clinic [phone] and follow the prompts.  For any non-urgent questions, you may also contact your provider using MyChart. We now offer e-Visits for anyone 86 and older to request care online for non-urgent symptoms. For details visit [email or web address].   Also download the MyChart app! Go to the app store, search "MyChart", open the app, select [facility], and log in with your MyChart username and password.  Masks are optional in the cancer centers. If you would like for your care team to wear a mask while they are taking care of you, please let them know. For doctor visits, patients may have with them one support person who is at least 86 years old. At this time, visitors are not allowed in the infusion area.

## 2021-11-08 NOTE — Patient Instructions (Addendum)
[facility] at [facility] Discharge Instructions  You were seen and examined today by Dr. Delton Coombes.  Dr. Delton Coombes discussed your most recent lab work and your hemoglobin is low 6.9 today. Dr. Delton Coombes has recommended blood transfusion.  Myeloma number one has come down and the other has went up. Dr. Delton Coombes is not going to change anything at this time. Continue taking the Acyclovir 400 mg and Dexamethasone 2 mg, Dr. Delton Coombes sent in refills for both.  Follow-up as scheduled.    Thank you for choosing [facility] at [facility] to provide your oncology and hematology care.  To afford each patient quality time with our provider, please arrive at least 15 minutes before your scheduled appointment time.   If you have a lab appointment with the [facility] please come in thru the Main Entrance and check in at the main information desk.  You need to re-schedule your appointment should you arrive 10 or more minutes late.  We strive to give you quality time with our providers, and arriving late affects you and other patients whose appointments are after yours.  Also, if you no show three or more times for appointments you may be dismissed from the clinic at the providers discretion.     Again, thank you for choosing [facility].  Our hope is that these requests will decrease the amount of time that you wait before being seen by our physicians.       _____________________________________________________________  Should you have questions after your visit to [facility], please contact our office at [phone] and follow the prompts.  Our office hours are 8:00 a.m. and 4:30 p.m. Monday - Friday.  Please note that voicemails left after 4:00 p.m. may not be returned until the following business day.  We are closed weekends and major holidays.  You do have access to a nurse 24-7, just call the main number to the clinic  [phone] and do not press any options, hold on the line and a nurse will answer the phone.    For prescription refill requests, have your pharmacy contact our office and allow 72 hours.    Due to Covid, you will need to wear a mask upon entering the hospital. If you do not have a mask, a mask will be given to you at the Main Entrance upon arrival. For doctor visits, patients may have 1 support person age 5 or older with them. For treatment visits, patients can not have anyone with them due to social distancing guidelines and our immunocompromised population.

## 2021-11-09 ENCOUNTER — Inpatient Hospital Stay (HOSPITAL_COMMUNITY): Payer: Medicare Other

## 2021-11-09 DIAGNOSIS — C9000 Multiple myeloma not having achieved remission: Secondary | ICD-10-CM | POA: Diagnosis not present

## 2021-11-09 DIAGNOSIS — Z7982 Long term (current) use of aspirin: Secondary | ICD-10-CM | POA: Diagnosis not present

## 2021-11-09 DIAGNOSIS — Z5112 Encounter for antineoplastic immunotherapy: Secondary | ICD-10-CM | POA: Diagnosis not present

## 2021-11-09 DIAGNOSIS — D6481 Anemia due to antineoplastic chemotherapy: Secondary | ICD-10-CM | POA: Diagnosis not present

## 2021-11-09 MED ORDER — ACETAMINOPHEN 325 MG PO TABS
650.0000 mg | ORAL_TABLET | Freq: Once | ORAL | Status: AC
  Administered 2021-11-09: 650 mg via ORAL
  Filled 2021-11-09: qty 2

## 2021-11-09 MED ORDER — DIPHENHYDRAMINE HCL 25 MG PO CAPS
25.0000 mg | ORAL_CAPSULE | Freq: Once | ORAL | Status: AC
  Administered 2021-11-09: 25 mg via ORAL
  Filled 2021-11-09: qty 1

## 2021-11-09 MED ORDER — SODIUM CHLORIDE 0.9% IV SOLUTION
250.0000 mL | Freq: Once | INTRAVENOUS | Status: AC
  Administered 2021-11-09: 250 mL via INTRAVENOUS

## 2021-11-09 NOTE — Progress Notes (Signed)
1 unit of PRBC's given today per MD orders. Tolerated infusion without adverse affects. Vital signs stable. No complaints at this time. Discharged from clinic ambulatory in stable condition. Alert and oriented x 3. F/U with [facility] as scheduled.

## 2021-11-09 NOTE — Patient Instructions (Signed)
[facility]  Discharge Instructions: Thank you for choosing [facility] to provide your oncology and hematology care.  If you have a lab appointment with the [facility], please come in thru the Main Entrance and check in at the main information desk.  Wear comfortable clothing and clothing appropriate for easy access to any Portacath or PICC line.   We strive to give you quality time with your provider. You may need to reschedule your appointment if you arrive late (15 or more minutes).  Arriving late affects you and other patients whose appointments are after yours.  Also, if you miss three or more appointments without notifying the office, you may be dismissed from the clinic at the provider's discretion.      For prescription refill requests, have your pharmacy contact our office and allow 72 hours for refills to be completed.    Today you received the following chemotherapy and/or immunotherapy agents blood products.  Blood Transfusion, Adult, Care After This sheet gives you information about how to care for yourself after your procedure. Your doctor may also give you more specific instructions. If you have problems or questions, contact your doctor. What can I expect after the procedure? After the procedure, it is common to have: Bruising and soreness at the IV site. A headache. Follow these instructions at home: Insertion site care     Follow instructions from your doctor about how to take care of your insertion site. This is where an IV tube was put into your vein. Make sure you: Wash your hands with soap and water before and after you change your bandage (dressing). If you cannot use soap and water, use hand sanitizer. Change your bandage as told by your doctor. Check your insertion site every day for signs of infection. Check for: Redness, swelling, or pain. Bleeding from the site. Warmth. Pus or a bad smell. General instructions Take  over-the-counter and prescription medicines only as told by your doctor. Rest as told by your doctor. Go back to your normal activities as told by your doctor. Keep all follow-up visits as told by your doctor. This is important. Contact a doctor if: You have itching or red, swollen areas of skin (hives). You feel worried or nervous (anxious). You feel weak after doing your normal activities. You have redness, swelling, warmth, or pain around the insertion site. You have blood coming from the insertion site, and the blood does not stop with pressure. You have pus or a bad smell coming from the insertion site. Get help right away if: You have signs of a serious reaction. This may be coming from an allergy or the body's defense system (immune system). Signs include: Trouble breathing or shortness of breath. Swelling of the face or feeling warm (flushed). Fever or chills. Head, chest, or back pain. Dark pee (urine) or blood in the pee. Widespread rash. Fast heartbeat. Feeling dizzy or light-headed. You may receive your blood transfusion in an outpatient setting. If so, you will be told whom to contact to report any reactions. These symptoms may be an emergency. Do not wait to see if the symptoms will go away. Get medical help right away. Call your local emergency services (911 in the [place] Do not drive yourself to the hospital. Summary Bruising and soreness at the IV site are common. Check your insertion site every day for signs of infection. Rest as told by your doctor. Go back to your normal activities as told by your  doctor. Get help right away if you have signs of a serious reaction. This information is not intended to replace advice given to you by your health care provider. Make sure you discuss any questions you have with your health care provider. Document Revised: 08/17/2020 Document Reviewed: 10/15/2018 Elsevier Patient Education  [place].       To help prevent  nausea and vomiting after your treatment, we encourage you to take your nausea medication as directed.  BELOW ARE SYMPTOMS THAT SHOULD BE REPORTED IMMEDIATELY: *FEVER GREATER THAN 100.4 F (38 C) OR HIGHER *CHILLS OR SWEATING *NAUSEA AND VOMITING THAT IS NOT CONTROLLED WITH YOUR NAUSEA MEDICATION *UNUSUAL SHORTNESS OF BREATH *UNUSUAL BRUISING OR BLEEDING *URINARY PROBLEMS (pain or burning when urinating, or frequent urination) *BOWEL PROBLEMS (unusual diarrhea, constipation, pain near the anus) TENDERNESS IN MOUTH AND THROAT WITH OR WITHOUT PRESENCE OF ULCERS (sore throat, sores in mouth, or a toothache) UNUSUAL RASH, SWELLING OR PAIN  UNUSUAL VAGINAL DISCHARGE OR ITCHING   Items with * indicate a potential emergency and should be followed up as soon as possible or go to the Emergency Department if any problems should occur.  Please show the CHEMOTHERAPY ALERT CARD or IMMUNOTHERAPY ALERT CARD at check-in to the Emergency Department and triage nurse.  Should you have questions after your visit or need to cancel or reschedule your appointment, please contact [facility] [phone]  and follow the prompts.  Office hours are 8:00 a.m. to 4:30 p.m. Monday - Friday. Please note that voicemails left after 4:00 p.m. may not be returned until the following business day.  We are closed weekends and major holidays. You have access to a nurse at all times for urgent questions. Please call the main number to the clinic [phone] and follow the prompts.  For any non-urgent questions, you may also contact your provider using MyChart. We now offer e-Visits for anyone 86 and older to request care online for non-urgent symptoms. For details visit [email or web address].   Also download the MyChart app! Go to the app store, search "MyChart", open the app, select [facility], and log in with your MyChart username and password.  Masks are optional in the cancer centers. If you would like for  your care team to wear a mask while they are taking care of you, please let them know. For doctor visits, patients may have with them one support person who is at least 86 years old. At this time, visitors are not allowed in the infusion area.

## 2021-11-10 LAB — TYPE AND SCREEN
ABO/RH(D): O POS
Antibody Screen: NEGATIVE
Unit division: 0

## 2021-11-10 LAB — BPAM RBC
Blood Product Expiration Date: 202308032359
ISSUE DATE / TIME: 202307070902
Unit Type and Rh: 5100

## 2021-11-15 ENCOUNTER — Encounter (HOSPITAL_COMMUNITY): Payer: Self-pay

## 2021-11-15 ENCOUNTER — Inpatient Hospital Stay (HOSPITAL_COMMUNITY): Payer: Medicare Other

## 2021-11-15 VITALS — BP 135/51 | HR 63 | Temp 96.9°F | Resp 18 | Wt 127.2 lb

## 2021-11-15 DIAGNOSIS — Z7982 Long term (current) use of aspirin: Secondary | ICD-10-CM | POA: Diagnosis not present

## 2021-11-15 DIAGNOSIS — Z5112 Encounter for antineoplastic immunotherapy: Secondary | ICD-10-CM | POA: Diagnosis not present

## 2021-11-15 DIAGNOSIS — C9000 Multiple myeloma not having achieved remission: Secondary | ICD-10-CM | POA: Diagnosis not present

## 2021-11-15 DIAGNOSIS — D6481 Anemia due to antineoplastic chemotherapy: Secondary | ICD-10-CM | POA: Diagnosis not present

## 2021-11-15 DIAGNOSIS — D649 Anemia, unspecified: Secondary | ICD-10-CM

## 2021-11-15 LAB — COMPREHENSIVE METABOLIC PANEL
ALT: 24 U/L (ref 0–44)
AST: 20 U/L (ref 15–41)
Albumin: 3 g/dL — ABNORMAL LOW (ref 3.5–5.0)
Alkaline Phosphatase: 56 U/L (ref 38–126)
Anion gap: 2 — ABNORMAL LOW (ref 5–15)
BUN: 26 mg/dL — ABNORMAL HIGH (ref 8–23)
CO2: 25 mmol/L (ref 22–32)
Calcium: 8.7 mg/dL — ABNORMAL LOW (ref 8.9–10.3)
Chloride: 105 mmol/L (ref 98–111)
Creatinine, Ser: 0.9 mg/dL (ref 0.61–1.24)
GFR, Estimated: >60 mL/min (ref >60)
Glucose, Bld: 102 mg/dL — ABNORMAL HIGH (ref 70–99)
Potassium: 4.1 mmol/L (ref 3.5–5.1)
Sodium: 132 mmol/L — ABNORMAL LOW (ref 135–145)
Total Bilirubin: 0.7 mg/dL (ref 0.3–1.2)
Total Protein: 8.4 g/dL — ABNORMAL HIGH (ref 6.5–8.1)

## 2021-11-15 LAB — CBC WITH DIFFERENTIAL/PLATELET
Abs Immature Granulocytes: 0 10*3/uL (ref 0.00–0.07)
Basophils Absolute: 0 10*3/uL (ref 0.0–0.1)
Basophils Relative: 0 %
Eosinophils Absolute: 0.1 10*3/uL (ref 0.0–0.5)
Eosinophils Relative: 2 %
HCT: 23.6 % — ABNORMAL LOW (ref 39.0–52.0)
Hemoglobin: 7.7 g/dL — ABNORMAL LOW (ref 13.0–17.0)
Lymphocytes Relative: 22 %
Lymphs Abs: 0.7 10*3/uL (ref 0.7–4.0)
MCH: 35 pg — ABNORMAL HIGH (ref 26.0–34.0)
MCHC: 32.6 g/dL (ref 30.0–36.0)
MCV: 107.3 fL — ABNORMAL HIGH (ref 80.0–100.0)
Monocytes Absolute: 0.6 10*3/uL (ref 0.1–1.0)
Monocytes Relative: 19 %
Neutro Abs: 1.8 10*3/uL (ref 1.7–7.7)
Neutrophils Relative %: 57 %
Platelets: 78 10*3/uL — ABNORMAL LOW (ref 150–400)
RBC: 2.2 MIL/uL — ABNORMAL LOW (ref 4.22–5.81)
RDW: 22.2 % — ABNORMAL HIGH (ref 11.5–15.5)
WBC: 3.2 10*3/uL — ABNORMAL LOW (ref 4.0–10.5)
nRBC: 0 % (ref 0.0–0.2)

## 2021-11-15 LAB — SAMPLE TO BLOOD BANK

## 2021-11-15 LAB — MAGNESIUM: Magnesium: 2.1 mg/dL (ref 1.7–2.4)

## 2021-11-15 MED ORDER — BORTEZOMIB CHEMO SQ INJECTION 3.5 MG (2.5MG/ML)
1.0000 mg/m2 | Freq: Once | INTRAMUSCULAR | Status: AC
  Administered 2021-11-15: 1.75 mg via SUBCUTANEOUS
  Filled 2021-11-15: qty 0.7

## 2021-11-15 NOTE — Patient Instructions (Signed)
[facility]  Discharge Instructions: Thank you for choosing [facility] to provide your oncology and hematology care.  If you have a lab appointment with the Cancer Center, please come in thru the Main Entrance and check in at the main information desk.  Wear comfortable clothing and clothing appropriate for easy access to any Portacath or PICC line.   We strive to give you quality time with your provider. You may need to reschedule your appointment if you arrive late (15 or more minutes).  Arriving late affects you and other patients whose appointments are after yours.  Also, if you miss three or more appointments without notifying the office, you may be dismissed from the clinic at the provider's discretion.      For prescription refill requests, have your pharmacy contact our office and allow 72 hours for refills to be completed.    Today you received the following chemotherapy and/or immunotherapy agents velcade.  Bortezomib injection What is this medication? BORTEZOMIB (bor TEZ oh mib) targets proteins in cancer cells and stops the cancer cells from growing. It treats multiple myeloma and mantle cell lymphoma. This medicine may be used for other purposes; ask your health care provider or pharmacist if you have questions. COMMON BRAND NAME(S): Velcade What should I tell my care team before I take this medication? They need to know if you have any of these conditions: dehydration diabetes (high blood sugar) heart disease liver disease tingling of the fingers or toes or other nerve disorder an unusual or allergic reaction to bortezomib, mannitol, boron, other medicines, foods, dyes, or preservatives pregnant or trying to get pregnant breast-feeding How should I use this medication? This medicine is injected into a vein or under the skin. It is given by a health care provider in a hospital or clinic setting. Talk to your health care provider about the use of  this medicine in children. Special care may be needed. Overdosage: If you think you have taken too much of this medicine contact a poison control center or emergency room at once. NOTE: This medicine is only for you. Do not share this medicine with others. What if I miss a dose? Keep appointments for follow-up doses. It is important not to miss your dose. Call your health care provider if you are unable to keep an appointment. What may interact with this medication? This medicine may interact with the following medications: ketoconazole rifampin This list may not describe all possible interactions. Give your health care provider a list of all the medicines, herbs, non-prescription drugs, or dietary supplements you use. Also tell them if you smoke, drink alcohol, or use illegal drugs. Some items may interact with your medicine. What should I watch for while using this medication? Your condition will be monitored carefully while you are receiving this medicine. You may need blood work done while you are taking this medicine. You may get drowsy or dizzy. Do not drive, use machinery, or do anything that needs mental alertness until you know how this medicine affects you. Do not stand up or sit up quickly, especially if you are an older patient. This reduces the risk of dizzy or fainting spells This medicine may increase your risk of getting an infection. Call your health care provider for advice if you get a fever, chills, sore throat, or other symptoms of a cold or flu. Do not treat yourself. Try to avoid being around people who are sick. Check with your health care provider   if you have severe diarrhea, nausea, and vomiting, or if you sweat a lot. The loss of too much body fluid may make it dangerous for you to take this medicine. Do not become pregnant while taking this medicine or for 7 months after stopping it. Women should inform their health care provider if they wish to become pregnant or think  they might be pregnant. Men should not father a child while taking this medicine and for 4 months after stopping it. There is a potential for serious harm to an unborn child. Talk to your health care provider for more information. Do not breast-feed an infant while taking this medicine or for 2 months after stopping it. This medicine may make it more difficult to get pregnant or father a child. Talk to your health care provider if you are concerned about your fertility. What side effects may I notice from receiving this medication? Side effects that you should report to your doctor or health care professional as soon as possible: allergic reactions (skin rash; itching or hives; swelling of the face, lips, or tongue) bleeding (bloody or black, tarry stools; red or dark brown urine; spitting up blood or brown material that looks like coffee grounds; red spots on the skin; unusual bruising or bleeding from the eye, gums, or nose) blurred vision or changes in vision confusion constipation headache heart failure (trouble breathing; fast, irregular heartbeat; sudden weight gain; swelling of the ankles, feet, hands) infection (fever, chills, cough, sore throat, pain or trouble passing urine) lack or loss of appetite liver injury (dark yellow or brown urine; general ill feeling or flu-like symptoms; loss of appetite, right upper belly pain; yellowing of the eyes or skin) low blood pressure (dizziness; feeling faint or lightheaded, falls; unusually weak or tired) muscle cramps pain, redness, or irritation at site where injected pain, tingling, numbness in the hands or feet seizures trouble breathing unusual bruising or bleeding Side effects that usually do not require medical attention (report to your doctor or health care professional if they continue or are bothersome): diarrhea nausea stomach pain trouble sleeping vomiting This list may not describe all possible side effects. Call your doctor  for medical advice about side effects. You may report side effects to FDA at 1-800-FDA-1088. Where should I keep my medication? This medicine is given in a hospital or clinic. It will not be stored at home. NOTE: This sheet is a summary. It may not cover all possible information. If you have questions about this medicine, talk to your doctor, pharmacist, or health care provider.  2023 Elsevier/Gold Standard (2020-04-13 00:00:00)       To help prevent nausea and vomiting after your treatment, we encourage you to take your nausea medication as directed.  BELOW ARE SYMPTOMS THAT SHOULD BE REPORTED IMMEDIATELY: *FEVER GREATER THAN 100.4 F (38 C) OR HIGHER *CHILLS OR SWEATING *NAUSEA AND VOMITING THAT IS NOT CONTROLLED WITH YOUR NAUSEA MEDICATION *UNUSUAL SHORTNESS OF BREATH *UNUSUAL BRUISING OR BLEEDING *URINARY PROBLEMS (pain or burning when urinating, or frequent urination) *BOWEL PROBLEMS (unusual diarrhea, constipation, pain near the anus) TENDERNESS IN MOUTH AND THROAT WITH OR WITHOUT PRESENCE OF ULCERS (sore throat, sores in mouth, or a toothache) UNUSUAL RASH, SWELLING OR PAIN  UNUSUAL VAGINAL DISCHARGE OR ITCHING   Items with * indicate a potential emergency and should be followed up as soon as possible or go to the Emergency Department if any problems should occur.  Please show the CHEMOTHERAPY ALERT CARD or IMMUNOTHERAPY ALERT CARD at check-in   to the Emergency Department and triage nurse.  Should you have questions after your visit or need to cancel or reschedule your appointment, please contact [facility] [phone]  and follow the prompts.  Office hours are 8:00 a.m. to 4:30 p.m. Monday - Friday. Please note that voicemails left after 4:00 p.m. may not be returned until the following business day.  We are closed weekends and major holidays. You have access to a nurse at all times for urgent questions. Please call the main number to the clinic [phone] and follow  the prompts.  For any non-urgent questions, you may also contact your provider using MyChart. We now offer e-Visits for anyone 18 and older to request care online for non-urgent symptoms. For details visit [email or web address].   Also download the MyChart app! Go to the app store, search "MyChart", open the app, select [facility], and log in with your MyChart username and password.  Masks are optional in the cancer centers. If you would like for your care team to wear a mask while they are taking care of you, please let them know. For doctor visits, patients may have with them one support person who is at least 86 years old. At this time, visitors are not allowed in the infusion area.  

## 2021-11-15 NOTE — Congregational Nurse Program (Signed)
F/u with patient as a result of food security and delivered  senior meals to home on today 

## 2021-11-15 NOTE — Congregational Nurse Program (Signed)
  Dept: [phone]   Congregational Nurse Program Note  Date of Encounter: 11/15/2021  Past Medical History: Past Medical History:  Diagnosis Date   Allergic rhinitis    Anal fissure    Anginal pain (HCC)    Asthma    Cancer ([facility])    skin   Chest pain    CHF (congestive heart failure) ([facility])    Coronary heart disease    s/p stenting. cath in 01/2012 noncritical occlusion   Dysrhythmia    1st degree heart block   GERD (gastroesophageal reflux disease)    Glaucoma    Hiatal hernia    Hyperlipidemia    Hypertension    Hypothyroidism    Idiopathic thrombocytopenic purpura ([facility]) 2002   Macular degeneration    Nephrolithiasis    PUD (peptic ulcer disease)    remote   Sarcoidosis    pulmonary   Schatzki's ring     Encounter Details:     Dept: [phone]   Congregational Nurse Program Note  Date of Encounter: 11/15/2021  Past Medical History: Past Medical History:  Diagnosis Date   Allergic rhinitis    Anal fissure    Anginal pain (HCC)    Asthma    Cancer ([facility])    skin   Chest pain    CHF (congestive heart failure) (HCC)    Coronary heart disease    s/p stenting. cath in 01/2012 noncritical occlusion   Dysrhythmia    1st degree heart block   GERD (gastroesophageal reflux disease)    Glaucoma    Hiatal hernia    Hyperlipidemia    Hypertension    Hypothyroidism    Idiopathic thrombocytopenic purpura ([facility]) 2002   Macular degeneration    Nephrolithiasis    PUD (peptic ulcer disease)    remote   Sarcoidosis    pulmonary   Schatzki's ring     Encounter Details:

## 2021-11-15 NOTE — Progress Notes (Signed)
Platelets 78 today and ok for treatment.    Patient tolerated Velcade injection with no complaints voiced.  Lab work reviewed.  See MAR for details.  Injection site clean and dry with no bruising or swelling noted.  Patient stable during and after injection.  Band aid applied.  VSS.  Patient left in satisfactory condition with no s/s of distress noted.

## 2021-11-21 ENCOUNTER — Other Ambulatory Visit (HOSPITAL_COMMUNITY): Payer: Self-pay

## 2021-11-22 ENCOUNTER — Inpatient Hospital Stay (HOSPITAL_COMMUNITY): Payer: Medicare Other

## 2021-11-22 VITALS — BP 112/47 | HR 59 | Temp 96.6°F | Resp 18 | Wt 125.4 lb

## 2021-11-22 DIAGNOSIS — Z7982 Long term (current) use of aspirin: Secondary | ICD-10-CM | POA: Diagnosis not present

## 2021-11-22 DIAGNOSIS — C9000 Multiple myeloma not having achieved remission: Secondary | ICD-10-CM

## 2021-11-22 DIAGNOSIS — D6481 Anemia due to antineoplastic chemotherapy: Secondary | ICD-10-CM | POA: Diagnosis not present

## 2021-11-22 DIAGNOSIS — D649 Anemia, unspecified: Secondary | ICD-10-CM

## 2021-11-22 DIAGNOSIS — D696 Thrombocytopenia, unspecified: Secondary | ICD-10-CM

## 2021-11-22 DIAGNOSIS — Z5112 Encounter for antineoplastic immunotherapy: Secondary | ICD-10-CM | POA: Diagnosis not present

## 2021-11-22 LAB — CBC WITH DIFFERENTIAL/PLATELET
Abs Immature Granulocytes: 0.02 10*3/uL (ref 0.00–0.07)
Basophils Absolute: 0 10*3/uL (ref 0.0–0.1)
Basophils Relative: 0 %
Eosinophils Absolute: 0 10*3/uL (ref 0.0–0.5)
Eosinophils Relative: 1 %
HCT: 23 % — ABNORMAL LOW (ref 39.0–52.0)
Hemoglobin: 7.5 g/dL — ABNORMAL LOW (ref 13.0–17.0)
Immature Granulocytes: 1 %
Lymphocytes Relative: 26 %
Lymphs Abs: 0.9 10*3/uL (ref 0.7–4.0)
MCH: 35.7 pg — ABNORMAL HIGH (ref 26.0–34.0)
MCHC: 32.6 g/dL (ref 30.0–36.0)
MCV: 109.5 fL — ABNORMAL HIGH (ref 80.0–100.0)
Monocytes Absolute: 0.7 10*3/uL (ref 0.1–1.0)
Monocytes Relative: 21 %
Neutro Abs: 1.8 10*3/uL (ref 1.7–7.7)
Neutrophils Relative %: 51 %
Platelets: 59 10*3/uL — ABNORMAL LOW (ref 150–400)
RBC: 2.1 MIL/uL — ABNORMAL LOW (ref 4.22–5.81)
RDW: 22.8 % — ABNORMAL HIGH (ref 11.5–15.5)
WBC: 3.5 10*3/uL — ABNORMAL LOW (ref 4.0–10.5)
nRBC: 0 % (ref 0.0–0.2)

## 2021-11-22 LAB — COMPREHENSIVE METABOLIC PANEL
ALT: 21 U/L (ref 0–44)
AST: 18 U/L (ref 15–41)
Albumin: 2.9 g/dL — ABNORMAL LOW (ref 3.5–5.0)
Alkaline Phosphatase: 55 U/L (ref 38–126)
Anion gap: 5 (ref 5–15)
BUN: 32 mg/dL — ABNORMAL HIGH (ref 8–23)
CO2: 25 mmol/L (ref 22–32)
Calcium: 8.9 mg/dL (ref 8.9–10.3)
Chloride: 105 mmol/L (ref 98–111)
Creatinine, Ser: 0.87 mg/dL (ref 0.61–1.24)
GFR, Estimated: >60 mL/min (ref >60)
Glucose, Bld: 105 mg/dL — ABNORMAL HIGH (ref 70–99)
Potassium: 3.9 mmol/L (ref 3.5–5.1)
Sodium: 135 mmol/L (ref 135–145)
Total Bilirubin: 0.7 mg/dL (ref 0.3–1.2)
Total Protein: 8.4 g/dL — ABNORMAL HIGH (ref 6.5–8.1)

## 2021-11-22 LAB — SAMPLE TO BLOOD BANK

## 2021-11-22 MED ORDER — BORTEZOMIB CHEMO SQ INJECTION 3.5 MG (2.5MG/ML)
1.0000 mg/m2 | Freq: Once | INTRAMUSCULAR | Status: AC
  Administered 2021-11-22: 1.75 mg via SUBCUTANEOUS
  Filled 2021-11-22: qty 0.7

## 2021-11-22 NOTE — Progress Notes (Signed)
Patient presents today for Velcade injection per providers order.  Vital signs and labs reviewed.  Message received from Dr. Delton Coombes patient okay for treatment.  Velcade administration without incident; injection site WNL; see MAR for injection details.  Patient tolerated procedure well and without incident.  No questions or complaints noted at this time.

## 2021-11-22 NOTE — Progress Notes (Signed)
Ok to proceed with today's labs - Hgb 7.5 and Platelets 59K  T.O. Dr Rhys Martini, PharmD

## 2021-11-22 NOTE — Patient Instructions (Signed)
[facility]  Discharge Instructions: Thank you for choosing [facility] to provide your oncology and hematology care.  If you have a lab appointment with the [facility], please come in thru the Main Entrance and check in at the main information desk.  Wear comfortable clothing and clothing appropriate for easy access to any Portacath or PICC line.   We strive to give you quality time with your provider. You may need to reschedule your appointment if you arrive late (15 or more minutes).  Arriving late affects you and other patients whose appointments are after yours.  Also, if you miss three or more appointments without notifying the office, you may be dismissed from the clinic at the provider's discretion.      For prescription refill requests, have your pharmacy contact our office and allow 72 hours for refills to be completed.    Today you received the following chemotherapy and/or immunotherapy agents Velcade      To help prevent nausea and vomiting after your treatment, we encourage you to take your nausea medication as directed.  BELOW ARE SYMPTOMS THAT SHOULD BE REPORTED IMMEDIATELY: *FEVER GREATER THAN 100.4 F (38 C) OR HIGHER *CHILLS OR SWEATING *NAUSEA AND VOMITING THAT IS NOT CONTROLLED WITH YOUR NAUSEA MEDICATION *UNUSUAL SHORTNESS OF BREATH *UNUSUAL BRUISING OR BLEEDING *URINARY PROBLEMS (pain or burning when urinating, or frequent urination) *BOWEL PROBLEMS (unusual diarrhea, constipation, pain near the anus) TENDERNESS IN MOUTH AND THROAT WITH OR WITHOUT PRESENCE OF ULCERS (sore throat, sores in mouth, or a toothache) UNUSUAL RASH, SWELLING OR PAIN  UNUSUAL VAGINAL DISCHARGE OR ITCHING   Items with * indicate a potential emergency and should be followed up as soon as possible or go to the Emergency Department if any problems should occur.  Please show the CHEMOTHERAPY ALERT CARD or IMMUNOTHERAPY ALERT CARD at check-in to the Emergency  Department and triage nurse.  Should you have questions after your visit or need to cancel or reschedule your appointment, please contact [facility] [phone]  and follow the prompts.  Office hours are 8:00 a.m. to 4:30 p.m. Monday - Friday. Please note that voicemails left after 4:00 p.m. may not be returned until the following business day.  We are closed weekends and major holidays. You have access to a nurse at all times for urgent questions. Please call the main number to the clinic [phone] and follow the prompts.  For any non-urgent questions, you may also contact your provider using MyChart. We now offer e-Visits for anyone 86 and older to request care online for non-urgent symptoms. For details visit [email or web address].   Also download the MyChart app! Go to the app store, search "MyChart", open the app, select [facility], and log in with your MyChart username and password.  Masks are optional in the cancer centers. If you would like for your care team to wear a mask while they are taking care of you, please let them know. For doctor visits, patients may have with them one support person who is at least 86 years old. At this time, visitors are not allowed in the infusion area.

## 2021-11-23 LAB — MAGNESIUM: Magnesium: 2.2 mg/dL (ref 1.7–2.4)

## 2021-11-26 ENCOUNTER — Other Ambulatory Visit: Payer: Self-pay

## 2021-11-26 DIAGNOSIS — M25571 Pain in right ankle and joints of right foot: Secondary | ICD-10-CM | POA: Diagnosis not present

## 2021-11-26 DIAGNOSIS — R2241 Localized swelling, mass and lump, right lower limb: Secondary | ICD-10-CM | POA: Diagnosis not present

## 2021-11-26 DIAGNOSIS — L03115 Cellulitis of right lower limb: Secondary | ICD-10-CM | POA: Diagnosis not present

## 2021-11-26 DIAGNOSIS — I80291 Phlebitis and thrombophlebitis of other deep vessels of right lower extremity: Secondary | ICD-10-CM | POA: Diagnosis not present

## 2021-11-28 ENCOUNTER — Ambulatory Visit (INDEPENDENT_AMBULATORY_CARE_PROVIDER_SITE_OTHER): Payer: Medicare Other

## 2021-11-28 ENCOUNTER — Encounter: Payer: Self-pay | Admitting: Orthopaedic Surgery

## 2021-11-28 ENCOUNTER — Ambulatory Visit (INDEPENDENT_AMBULATORY_CARE_PROVIDER_SITE_OTHER): Payer: Medicare Other | Admitting: Orthopaedic Surgery

## 2021-11-28 VITALS — BP 138/58 | HR 67 | Ht 68.0 in | Wt 127.2 lb

## 2021-11-28 DIAGNOSIS — I25119 Atherosclerotic heart disease of native coronary artery with unspecified angina pectoris: Secondary | ICD-10-CM | POA: Diagnosis not present

## 2021-11-28 DIAGNOSIS — M79661 Pain in right lower leg: Secondary | ICD-10-CM | POA: Diagnosis not present

## 2021-11-28 NOTE — Progress Notes (Signed)
Subjective:    Patient ID: Dale [name], male    DOB: 1928-12-08, 88 y.o.   MRN: [id number]  HPI He has a red lesion on the right lateral leg at the distal third of the fibula.  He has seen [facility] and given Relafen and minocycline.  I have reviewed the notes and X-rays of the ankle but the X-rays did not show the area of the fibula that has the lesion.  He did have ankle pain but that is resolved.  I have independently reviewed and interpreted x-rays of this patient done at another site by another physician or qualified health professional.  He says this has been present about two weeks or so.  It hurts.  It does not drain.  He denies trauma or insect bite.   Review of Systems  Constitutional:  Positive for activity change.  Musculoskeletal:  Positive for arthralgias and gait problem.  All other systems reviewed and are negative. For Review of Systems, all other systems reviewed and are negative.  The following is a summary of the past history medically, past history surgically, known current medicines, social history and family history.  This information is gathered electronically by the computer from prior information and documentation.  I review this each visit and have found including this information at this point in the chart is beneficial and informative.   Past Medical History:  Diagnosis Date   Allergic rhinitis    Anal fissure    Anginal pain (HCC)    Asthma    Cancer (HCC)    skin   Chest pain    CHF (congestive heart failure) ([place])    Coronary heart disease    s/p stenting. cath in 01/2012 noncritical occlusion   Dysrhythmia    1st degree heart block   GERD (gastroesophageal reflux disease)    Glaucoma    Hiatal hernia    Hyperlipidemia    Hypertension    Hypothyroidism    Idiopathic thrombocytopenic purpura ([place]) 2002   Macular degeneration    Nephrolithiasis    PUD (peptic ulcer disease)    remote   Sarcoidosis    pulmonary   Schatzki's  ring     Past Surgical History:  Procedure Laterality Date   cardiac stents     COLONOSCOPY  10/30/2006   Normal rectum, sigmoid diverticula.Remainder of colonic mucosa appeared normal.   CORONARY ANGIOPLASTY WITH STENT PLACEMENT     about 10 years ago per pt (around 2007)   [place], URETEROSCOPY AND STENT PLACEMENT Left 06/16/2017   Procedure: CYSTOSCOPY WITH RETROGRADE PYELOGRAM, URETEROSCOPY,STONE EXTRACTION  AND STENT PLACEMENT;  Surgeon: Franchot Gallo, MD;  Location: [facility];  Service: Urology;  Laterality: Left;   ESOPHAGOGASTRODUODENOSCOPY  06/19/2004   Two esophageal rings and esophageal web as described above.  All of these were disrupted by passing 56-French Venia Minks dilator/ Candida esophagitis,which appears to be incidental given history of   antibiotic use, but nevertheless will be treated.   ESOPHAGOGASTRODUODENOSCOPY  10/30/2006   Distal tandem esophageal ring status post dilation disruption as  described above.  Otherwise normal esophagus/  Small hiatal hernia otherwise normal stomach, D1 and D2   ESOPHAGOGASTRODUODENOSCOPY N/A 03/22/2015   Dr.Rourk- noncritical schatzki's ring and hiatal hernia-o/w normal EGD.    ESOPHAGOGASTRODUODENOSCOPY (EGD) WITH ESOPHAGEAL DILATION  03/04/2012   RMR- schatzki's ring, hiatal hernia, polypoid gastric mucosa, bx= minimally active gastritis.   HOLMIUM LASER APPLICATION Left 08/17/2438   Procedure: HOLMIUM LASER APPLICATION;  Surgeon: Franchot Gallo, MD;  Location: [facility];  Service: Urology;  Laterality: Left;   IR GENERIC HISTORICAL  03/06/2016   IR RADIOLOGIST EVAL & MGMT 03/06/2016 Aletta Edouard, MD [facility] INTERV RAD   IR GENERIC HISTORICAL  06/18/2016   IR RADIOLOGIST EVAL & MGMT 06/18/2016 Aletta Edouard, MD [facility] INTERV RAD   IR RADIOLOGIST EVAL & MGMT  10/01/2016   IR RADIOLOGIST EVAL & MGMT  10/15/2017   IR RADIOLOGIST EVAL & MGMT  12/24/2018   IR RADIOLOGIST EVAL & MGMT  01/04/2020   IR RADIOLOGIST  EVAL & MGMT  06/20/2021   LEFT HEART CATH N/A 02/02/2012   Procedure: LEFT HEART CATH;  Surgeon: Lorretta Harp, MD;  Location: [facility] CATH LAB;  Service: Cardiovascular;  Laterality: N/A;   MEDIASTINOSCOPY     for dx sarcoid   RADIOLOGY WITH ANESTHESIA Left 05/17/2016   Procedure: left renal ablation;  Surgeon: Aletta Edouard, MD;  Location: [facility];  Service: Radiology;  Laterality: Left;    Current Outpatient Medications on File Prior to Visit  Medication Sig Dispense Refill   acyclovir (ZOVIRAX) 400 MG tablet Take 1 tablet (400 mg total) by mouth 2 (two) times daily. 180 tablet 0   aspirin 81 MG tablet Take 81 mg by mouth every Monday, Wednesday, and Friday.     atorvastatin (LIPITOR) 40 MG tablet TAKE 1 TABLET BY MOUTH IN  THE EVENING 90 tablet 3   Bortezomib (VELCADE IJ) Inject as directed. Three weeks on and one weeks off.     brimonidine (ALPHAGAN) 0.2 % ophthalmic solution Place 1 drop into both eyes 3 (three) times daily. Noon dosing only in right eye morning and evening in both eyes     dexamethasone (DECADRON) 2 MG tablet TAKE 5 TABLETS(10 MG) BY MOUTH 1 TIME A WEEK 60 tablet 0   dorzolamide-timolol (COSOPT) 22.3-6.8 MG/ML ophthalmic solution Place 1 drop into both eyes 2 (two) times daily.     furosemide (LASIX) 20 MG tablet If you notice any swelling, you may take 40 mg (two tablets) for the day. (Patient taking differently: Take 20 mg by mouth daily. If more swelling take an additional 20 mg in the afternoon) 90 tablet 3   isosorbide mononitrate (IMDUR) 60 MG 24 hr tablet TAKE 1 AND 1/2 TABLETS BY  MOUTH IN THE MORNING AND  1/2 TABLET AT NIGHT (Patient taking differently: TAKE 60 mg BY  MOUTH IN THE MORNING AND  30 mg TABLET AT NIGHT) 180 tablet 3   levothyroxine (SYNTHROID) 75 MCG tablet Take 75 mcg by mouth daily.     losartan (COZAAR) 100 MG tablet TAKE 1 TABLET BY MOUTH  DAILY 90 tablet 1   metoCLOPramide (REGLAN) 10 MG tablet Take 1 tablet (10 mg total) by mouth every 6 (six)  hours as needed for nausea or vomiting. 15 tablet 0   metoprolol tartrate (LOPRESSOR) 25 MG tablet TAKE 1 TABLET BY MOUTH IN  THE MORNING AND ONE-HALF  TABLET BY MOUTH IN THE  EVENING 135 tablet 3   Multiple Vitamins-Minerals (PRESERVISION/LUTEIN) CAPS Take 1 capsule by mouth 2 (two) times daily.     nitroGLYCERIN (NITROSTAT) 0.4 MG SL tablet Place 1 tablet (0.4 mg total) under the tongue every 5 (five) minutes as needed. For chest pain 25 tablet 2   pantoprazole (PROTONIX) 40 MG tablet TAKE 1 TABLET BY MOUTH  DAILY BEFORE BREAKFAST 90 tablet 1   potassium chloride SA (KLOR-CON M) 20 MEQ tablet TAKE 1 TABLET BY MOUTH  DAILY 90 tablet 3   prochlorperazine (COMPAZINE) 10 MG tablet Take 1 tablet (10 mg total) by mouth every 6 (six) hours as needed for nausea or vomiting. 30 tablet 3   ROCKLATAN 0.02-0.005 % SOLN Apply 1 drop to eye at bedtime.     triamcinolone (KENALOG) 0.1 % cream Apply 1 application. topically 2 (two) times daily as needed (for irritation).     vitamin C (ASCORBIC ACID) 500 MG tablet Take 500 mg by mouth daily.     vitamin E 200 UNIT capsule Take 200 Units by mouth daily.     No current facility-administered medications on file prior to visit.    Social History   Socioeconomic History   Marital status: Married    Spouse name: Not on file   Number of children: 1   Years of education: Not on file   Highest education level: Not on file  Occupational History   Occupation: Retired    Comment: [employer]: RETIRED  Tobacco Use   Smoking status: Former    Packs/day: 0.10    Years: 2.00    Total pack years: 0.20    Types: Cigarettes, Cigars    Quit date: 05/06/1970    Years since quitting: 51.6   Smokeless tobacco: Never  Vaping Use   Vaping Use: Never used  Substance and Sexual Activity   Alcohol use: No    Alcohol/week: 0.0 standard drinks of alcohol   Drug use: No   Sexual activity: Never  Other Topics Concern   Not on file  Social  History Narrative   Not on file   Social Determinants of Health   Financial Resource Strain: Not on file  Food Insecurity: No Food Insecurity (08/27/2021)   Hunger Vital Sign    Worried About Running Out of Food in the Last Year: Never true    Ran Out of Food in the Last Year: Never true  Transportation Needs: Not on file  Physical Activity: Not on file  Stress: Not on file  Social Connections: Not on file  Intimate Partner Violence: Not on file    Family History  Problem Relation Age of Onset   Heart disease Father        deceased age 52   Stroke Mother    Alzheimer's disease Mother    Heart attack Brother        deceased age 52   Cancer Other        niece   Colon cancer Neg Hx     BP (!) 138/58   Pulse 67   Ht '5\' 8"'$  (1.727 m)   Wt 127 lb 3.2 oz (57.7 kg)   BMI 19.34 kg/m   Body mass index is 19.34 kg/m.      Objective:   Physical Exam Vitals and nursing note reviewed. Exam conducted with a chaperone present.  Constitutional:      Appearance: He is well-developed.  HENT:     Head: Normocephalic and atraumatic.  Eyes:     Conjunctiva/sclera: Conjunctivae normal.     Pupils: Pupils are equal, round, and reactive to light.  Cardiovascular:     Rate and Rhythm: Normal rate and regular rhythm.  Pulmonary:     Effort: Pulmonary effort is normal.  Abdominal:     Palpations: Abdomen is soft.  Musculoskeletal:     Cervical back: Normal range of motion and neck supple.       Legs:  Skin:  General: Skin is warm and dry.  Neurological:     Mental Status: He is alert and oriented to person, place, and time.     Cranial Nerves: No cranial nerve deficit.     Motor: No abnormal muscle tone.     Coordination: Coordination normal.     Deep Tendon Reflexes: Reflexes are normal and symmetric. Reflexes normal.  Psychiatric:        Behavior: Behavior normal.        Thought Content: Thought content normal.        Judgment: Judgment normal.    X-rays were done  with a BB marker over the lesion of the right lower leg, reported separately.       Assessment & Plan:   Encounter Diagnosis  Name Primary?   Pain in right lower leg Yes   Continue the antibiotic.  Use warm compresses to the lesion.  Return in one week.  Call if any problem.  Precautions discussed.  Electronically Signed Sanjuana Kava, MD 7/26/202311:38 AM

## 2021-11-29 ENCOUNTER — Inpatient Hospital Stay (HOSPITAL_COMMUNITY): Payer: Medicare Other

## 2021-11-29 ENCOUNTER — Encounter (HOSPITAL_COMMUNITY): Payer: Self-pay

## 2021-11-29 VITALS — BP 146/51 | HR 64 | Temp 96.7°F | Resp 18 | Wt 126.4 lb

## 2021-11-29 DIAGNOSIS — Z5112 Encounter for antineoplastic immunotherapy: Secondary | ICD-10-CM | POA: Diagnosis not present

## 2021-11-29 DIAGNOSIS — D649 Anemia, unspecified: Secondary | ICD-10-CM

## 2021-11-29 DIAGNOSIS — D6481 Anemia due to antineoplastic chemotherapy: Secondary | ICD-10-CM | POA: Diagnosis not present

## 2021-11-29 DIAGNOSIS — Z7982 Long term (current) use of aspirin: Secondary | ICD-10-CM | POA: Diagnosis not present

## 2021-11-29 DIAGNOSIS — C9000 Multiple myeloma not having achieved remission: Secondary | ICD-10-CM

## 2021-11-29 LAB — COMPREHENSIVE METABOLIC PANEL
ALT: 20 U/L (ref 0–44)
AST: 15 U/L (ref 15–41)
Albumin: 3.1 g/dL — ABNORMAL LOW (ref 3.5–5.0)
Alkaline Phosphatase: 59 U/L (ref 38–126)
Anion gap: 5 (ref 5–15)
BUN: 32 mg/dL — ABNORMAL HIGH (ref 8–23)
CO2: 24 mmol/L (ref 22–32)
Calcium: 8.8 mg/dL — ABNORMAL LOW (ref 8.9–10.3)
Chloride: 106 mmol/L (ref 98–111)
Creatinine, Ser: 1 mg/dL (ref 0.61–1.24)
GFR, Estimated: >60 mL/min (ref >60)
Glucose, Bld: 82 mg/dL (ref 70–99)
Potassium: 4.4 mmol/L (ref 3.5–5.1)
Sodium: 135 mmol/L (ref 135–145)
Total Bilirubin: 0.9 mg/dL (ref 0.3–1.2)
Total Protein: 8.3 g/dL — ABNORMAL HIGH (ref 6.5–8.1)

## 2021-11-29 LAB — CBC WITH DIFFERENTIAL/PLATELET
Abs Immature Granulocytes: 0.01 10*3/uL (ref 0.00–0.07)
Basophils Absolute: 0 10*3/uL (ref 0.0–0.1)
Basophils Relative: 0 %
Eosinophils Absolute: 0.2 10*3/uL (ref 0.0–0.5)
Eosinophils Relative: 7 %
HCT: 22.9 % — ABNORMAL LOW (ref 39.0–52.0)
Hemoglobin: 7.5 g/dL — ABNORMAL LOW (ref 13.0–17.0)
Immature Granulocytes: 0 %
Lymphocytes Relative: 25 %
Lymphs Abs: 0.8 10*3/uL (ref 0.7–4.0)
MCH: 35.9 pg — ABNORMAL HIGH (ref 26.0–34.0)
MCHC: 32.8 g/dL (ref 30.0–36.0)
MCV: 109.6 fL — ABNORMAL HIGH (ref 80.0–100.0)
Monocytes Absolute: 0.7 10*3/uL (ref 0.1–1.0)
Monocytes Relative: 22 %
Neutro Abs: 1.4 10*3/uL — ABNORMAL LOW (ref 1.7–7.7)
Neutrophils Relative %: 46 %
Platelets: 92 10*3/uL — ABNORMAL LOW (ref 150–400)
RBC: 2.09 MIL/uL — ABNORMAL LOW (ref 4.22–5.81)
RDW: 22.5 % — ABNORMAL HIGH (ref 11.5–15.5)
WBC: 3.2 10*3/uL — ABNORMAL LOW (ref 4.0–10.5)
nRBC: 0 % (ref 0.0–0.2)

## 2021-11-29 LAB — MAGNESIUM: Magnesium: 2.1 mg/dL (ref 1.7–2.4)

## 2021-11-29 LAB — SAMPLE TO BLOOD BANK

## 2021-11-29 MED ORDER — BORTEZOMIB CHEMO SQ INJECTION 3.5 MG (2.5MG/ML)
1.0000 mg/m2 | Freq: Once | INTRAMUSCULAR | Status: AC
  Administered 2021-11-29: 1.75 mg via SUBCUTANEOUS
  Filled 2021-11-29: qty 0.7

## 2021-11-29 NOTE — Progress Notes (Signed)
Patient presents today for Velcade. ANC 1.4 and platelets 92 per Dr. Delton Coombes patient okay for treatment, pharmacy aware. Patient tolerated Velcade injection with no complaints voiced. Lab work reviewed. See MAR for details. Injection site clean and dry with no bruising or swelling noted. Patient stable during and after injection. Band aid applied. VSS. Patient left in satisfactory condition with no s/s of distress noted.

## 2021-11-29 NOTE — Progress Notes (Signed)
Ok to treat with today's labs  T.O. Dr Rhys Martini, PharmD

## 2021-11-29 NOTE — Patient Instructions (Signed)
[facility]  Discharge Instructions: Thank you for choosing [facility] to provide your oncology and hematology care.  If you have a lab appointment with the Cancer Center, please come in thru the Main Entrance and check in at the main information desk.  Wear comfortable clothing and clothing appropriate for easy access to any Portacath or PICC line.   We strive to give you quality time with your provider. You may need to reschedule your appointment if you arrive late (15 or more minutes).  Arriving late affects you and other patients whose appointments are after yours.  Also, if you miss three or more appointments without notifying the office, you may be dismissed from the clinic at the provider's discretion.      For prescription refill requests, have your pharmacy contact our office and allow 72 hours for refills to be completed.    Today you received the following chemotherapy and/or immunotherapy agents Velcade, return as scheduled. Bortezomib injection What is this medication? BORTEZOMIB (bor TEZ oh mib) targets proteins in cancer cells and stops the cancer cells from growing. It treats multiple myeloma and mantle cell lymphoma. This medicine may be used for other purposes; ask your health care provider or pharmacist if you have questions. COMMON BRAND NAME(S): Velcade What should I tell my care team before I take this medication? They need to know if you have any of these conditions: dehydration diabetes (high blood sugar) heart disease liver disease tingling of the fingers or toes or other nerve disorder an unusual or allergic reaction to bortezomib, mannitol, boron, other medicines, foods, dyes, or preservatives pregnant or trying to get pregnant breast-feeding How should I use this medication? This medicine is injected into a vein or under the skin. It is given by a health care provider in a hospital or clinic setting. Talk to your health care provider  about the use of this medicine in children. Special care may be needed. Overdosage: If you think you have taken too much of this medicine contact a poison control center or emergency room at once. NOTE: This medicine is only for you. Do not share this medicine with others. What if I miss a dose? Keep appointments for follow-up doses. It is important not to miss your dose. Call your health care provider if you are unable to keep an appointment. What may interact with this medication? This medicine may interact with the following medications: ketoconazole rifampin This list may not describe all possible interactions. Give your health care provider a list of all the medicines, herbs, non-prescription drugs, or dietary supplements you use. Also tell them if you smoke, drink alcohol, or use illegal drugs. Some items may interact with your medicine. What should I watch for while using this medication? Your condition will be monitored carefully while you are receiving this medicine. You may need blood work done while you are taking this medicine. You may get drowsy or dizzy. Do not drive, use machinery, or do anything that needs mental alertness until you know how this medicine affects you. Do not stand up or sit up quickly, especially if you are an older patient. This reduces the risk of dizzy or fainting spells This medicine may increase your risk of getting an infection. Call your health care provider for advice if you get a fever, chills, sore throat, or other symptoms of a cold or flu. Do not treat yourself. Try to avoid being around people who are sick. Check with your health   health care provider if you have severe diarrhea, nausea, and vomiting, or if you sweat a lot. The loss of too much body fluid may make it dangerous for you to take this medicine. Do not become pregnant while taking this medicine or for 7 months after stopping it. Women should inform their health care provider if they wish to  become pregnant or think they might be pregnant. Men should not father a child while taking this medicine and for 4 months after stopping it. There is a potential for serious harm to an unborn child. Talk to your health care provider for more information. Do not breast-feed an infant while taking this medicine or for 2 months after stopping it. This medicine may make it more difficult to get pregnant or father a child. Talk to your health care provider if you are concerned about your fertility. What side effects may I notice from receiving this medication? Side effects that you should report to your doctor or health care professional as soon as possible: allergic reactions (skin rash; itching or hives; swelling of the face, lips, or tongue) bleeding (bloody or black, tarry stools; red or dark brown urine; spitting up blood or brown material that looks like coffee grounds; red spots on the skin; unusual bruising or bleeding from the eye, gums, or nose) blurred vision or changes in vision confusion constipation headache heart failure (trouble breathing; fast, irregular heartbeat; sudden weight gain; swelling of the ankles, feet, hands) infection (fever, chills, cough, sore throat, pain or trouble passing urine) lack or loss of appetite liver injury (dark yellow or brown urine; general ill feeling or flu-like symptoms; loss of appetite, right upper belly pain; yellowing of the eyes or skin) low blood pressure (dizziness; feeling faint or lightheaded, falls; unusually weak or tired) muscle cramps pain, redness, or irritation at site where injected pain, tingling, numbness in the hands or feet seizures trouble breathing unusual bruising or bleeding Side effects that usually do not require medical attention (report to your doctor or health care professional if they continue or are bothersome): diarrhea nausea stomach pain trouble sleeping vomiting This list may not describe all possible side  effects. Call your doctor for medical advice about side effects. You may report side effects to FDA at 1-800-FDA-1088. Where should I keep my medication? This medicine is given in a hospital or clinic. It will not be stored at home. NOTE: This sheet is a summary. It may not cover all possible information. If you have questions about this medicine, talk to your doctor, pharmacist, or health care provider.  2023 Elsevier/Gold Standard (2020-04-13 00:00:00)    To help prevent nausea and vomiting after your treatment, we encourage you to take your nausea medication as directed.  BELOW ARE SYMPTOMS THAT SHOULD BE REPORTED IMMEDIATELY: *FEVER GREATER THAN 100.4 F (38 C) OR HIGHER *CHILLS OR SWEATING *NAUSEA AND VOMITING THAT IS NOT CONTROLLED WITH YOUR NAUSEA MEDICATION *UNUSUAL SHORTNESS OF BREATH *UNUSUAL BRUISING OR BLEEDING *URINARY PROBLEMS (pain or burning when urinating, or frequent urination) *BOWEL PROBLEMS (unusual diarrhea, constipation, pain near the anus) TENDERNESS IN MOUTH AND THROAT WITH OR WITHOUT PRESENCE OF ULCERS (sore throat, sores in mouth, or a toothache) UNUSUAL RASH, SWELLING OR PAIN  UNUSUAL VAGINAL DISCHARGE OR ITCHING   Items with * indicate a potential emergency and should be followed up as soon as possible or go to the Emergency Department if any problems should occur.  Please show the CHEMOTHERAPY ALERT CARD or IMMUNOTHERAPY ALERT CARD at check-in   to the Emergency Department and triage nurse.  Should you have questions after your visit or need to cancel or reschedule your appointment, please contact [facility] [phone]  and follow the prompts.  Office hours are 8:00 a.m. to 4:30 p.m. Monday - Friday. Please note that voicemails left after 4:00 p.m. may not be returned until the following business day.  We are closed weekends and major holidays. You have access to a nurse at all times for urgent questions. Please call the main number to the clinic  [phone] and follow the prompts.  For any non-urgent questions, you may also contact your provider using MyChart. We now offer e-Visits for anyone 18 and older to request care online for non-urgent symptoms. For details visit [email or web address].   Also download the MyChart app! Go to the app store, search "MyChart", open the app, select [facility], and log in with your MyChart username and password.  Masks are optional in the cancer centers. If you would like for your care team to wear a mask while they are taking care of you, please let them know. For doctor visits, patients may have with them one support person who is at least 86 years old. At this time, visitors are not allowed in the infusion area.  

## 2021-12-04 ENCOUNTER — Other Ambulatory Visit: Payer: Self-pay

## 2021-12-05 ENCOUNTER — Ambulatory Visit (INDEPENDENT_AMBULATORY_CARE_PROVIDER_SITE_OTHER): Payer: Medicare Other | Admitting: Orthopaedic Surgery

## 2021-12-05 ENCOUNTER — Encounter: Payer: Self-pay | Admitting: Orthopaedic Surgery

## 2021-12-05 DIAGNOSIS — M79661 Pain in right lower leg: Secondary | ICD-10-CM

## 2021-12-05 DIAGNOSIS — I25119 Atherosclerotic heart disease of native coronary artery with unspecified angina pectoris: Secondary | ICD-10-CM

## 2021-12-05 NOTE — Progress Notes (Signed)
I am a little better.  He has an area of swelling and a "knot" of the right lower leg. He has been using warm compresses and it is less swollen, less tender.  The lesion is smaller today, not a prominent.  It is non moveable and not red. Size is 1 cm by 0.5 cm.  Encounter Diagnosis  Name Primary?   Pain in right lower leg Yes   Continue the warm compresses.  Return in one month.  Call if any problem.  Precautions discussed.  Electronically Signed Sanjuana Kava, MD 8/2/202310:13 AM

## 2021-12-06 ENCOUNTER — Inpatient Hospital Stay: Payer: Medicare Other

## 2021-12-06 ENCOUNTER — Inpatient Hospital Stay: Payer: Medicare Other | Attending: Hematology

## 2021-12-06 VITALS — BP 139/54 | HR 60 | Temp 96.7°F | Resp 18

## 2021-12-06 DIAGNOSIS — Z5112 Encounter for antineoplastic immunotherapy: Secondary | ICD-10-CM | POA: Diagnosis not present

## 2021-12-06 DIAGNOSIS — Z79899 Other long term (current) drug therapy: Secondary | ICD-10-CM | POA: Insufficient documentation

## 2021-12-06 DIAGNOSIS — C9000 Multiple myeloma not having achieved remission: Secondary | ICD-10-CM | POA: Insufficient documentation

## 2021-12-06 DIAGNOSIS — D649 Anemia, unspecified: Secondary | ICD-10-CM

## 2021-12-06 DIAGNOSIS — D696 Thrombocytopenia, unspecified: Secondary | ICD-10-CM

## 2021-12-06 LAB — CBC WITH DIFFERENTIAL/PLATELET
Abs Immature Granulocytes: 0.02 K/uL (ref 0.00–0.07)
Basophils Absolute: 0 K/uL (ref 0.0–0.1)
Basophils Relative: 0 %
Eosinophils Absolute: 0.1 K/uL (ref 0.0–0.5)
Eosinophils Relative: 2 %
HCT: 22.9 % — ABNORMAL LOW (ref 39.0–52.0)
Hemoglobin: 7.4 g/dL — ABNORMAL LOW (ref 13.0–17.0)
Immature Granulocytes: 1 %
Lymphocytes Relative: 27 %
Lymphs Abs: 0.8 K/uL (ref 0.7–4.0)
MCH: 36.3 pg — ABNORMAL HIGH (ref 26.0–34.0)
MCHC: 32.3 g/dL (ref 30.0–36.0)
MCV: 112.3 fL — ABNORMAL HIGH (ref 80.0–100.0)
Monocytes Absolute: 0.7 K/uL (ref 0.1–1.0)
Monocytes Relative: 24 %
Neutro Abs: 1.4 K/uL — ABNORMAL LOW (ref 1.7–7.7)
Neutrophils Relative %: 46 %
Platelets: 88 K/uL — ABNORMAL LOW (ref 150–400)
RBC: 2.04 MIL/uL — ABNORMAL LOW (ref 4.22–5.81)
WBC: 3 K/uL — ABNORMAL LOW (ref 4.0–10.5)
nRBC: 0 % (ref 0.0–0.2)

## 2021-12-06 LAB — MAGNESIUM: Magnesium: 2.1 mg/dL (ref 1.7–2.4)

## 2021-12-06 LAB — COMPREHENSIVE METABOLIC PANEL WITH GFR
ALT: 19 U/L (ref 0–44)
AST: 18 U/L (ref 15–41)
Albumin: 3.1 g/dL — ABNORMAL LOW (ref 3.5–5.0)
Alkaline Phosphatase: 58 U/L (ref 38–126)
Anion gap: 4 — ABNORMAL LOW (ref 5–15)
BUN: 43 mg/dL — ABNORMAL HIGH (ref 8–23)
CO2: 25 mmol/L (ref 22–32)
Calcium: 8.8 mg/dL — ABNORMAL LOW (ref 8.9–10.3)
Chloride: 105 mmol/L (ref 98–111)
Creatinine, Ser: 1.13 mg/dL (ref 0.61–1.24)
GFR, Estimated: >60 mL/min (ref >60)
Glucose, Bld: 76 mg/dL (ref 70–99)
Potassium: 3.6 mmol/L (ref 3.5–5.1)
Sodium: 134 mmol/L — ABNORMAL LOW (ref 135–145)
Total Bilirubin: 1 mg/dL (ref 0.3–1.2)
Total Protein: 8.9 g/dL — ABNORMAL HIGH (ref 6.5–8.1)

## 2021-12-06 LAB — SAMPLE TO BLOOD BANK

## 2021-12-06 MED ORDER — BORTEZOMIB CHEMO SQ INJECTION 3.5 MG (2.5MG/ML)
1.0000 mg/m2 | Freq: Once | INTRAMUSCULAR | Status: AC
  Administered 2021-12-06: 1.75 mg via SUBCUTANEOUS
  Filled 2021-12-06: qty 0.7

## 2021-12-06 NOTE — Patient Instructions (Signed)
[facility]  Discharge Instructions: Thank you for choosing [facility] to provide your oncology and hematology care.  If you have a lab appointment with the [facility], please come in thru the Main Entrance and check in at the main information desk.  Wear comfortable clothing and clothing appropriate for easy access to any Portacath or PICC line.   We strive to give you quality time with your provider. You may need to reschedule your appointment if you arrive late (15 or more minutes).  Arriving late affects you and other patients whose appointments are after yours.  Also, if you miss three or more appointments without notifying the office, you may be dismissed from the clinic at the provider's discretion.      For prescription refill requests, have your pharmacy contact our office and allow 72 hours for refills to be completed.    Today you received the following chemotherapy and/or immunotherapy agents Velcade      To help prevent nausea and vomiting after your treatment, we encourage you to take your nausea medication as directed.  BELOW ARE SYMPTOMS THAT SHOULD BE REPORTED IMMEDIATELY: *FEVER GREATER THAN 100.4 F (38 C) OR HIGHER *CHILLS OR SWEATING *NAUSEA AND VOMITING THAT IS NOT CONTROLLED WITH YOUR NAUSEA MEDICATION *UNUSUAL SHORTNESS OF BREATH *UNUSUAL BRUISING OR BLEEDING *URINARY PROBLEMS (pain or burning when urinating, or frequent urination) *BOWEL PROBLEMS (unusual diarrhea, constipation, pain near the anus) TENDERNESS IN MOUTH AND THROAT WITH OR WITHOUT PRESENCE OF ULCERS (sore throat, sores in mouth, or a toothache) UNUSUAL RASH, SWELLING OR PAIN  UNUSUAL VAGINAL DISCHARGE OR ITCHING   Items with * indicate a potential emergency and should be followed up as soon as possible or go to the Emergency Department if any problems should occur.  Please show the CHEMOTHERAPY ALERT CARD or IMMUNOTHERAPY ALERT CARD at check-in to the Emergency  Department and triage nurse.  Should you have questions after your visit or need to cancel or reschedule your appointment, please contact [facility] [phone]  and follow the prompts.  Office hours are 8:00 a.m. to 4:30 p.m. Monday - Friday. Please note that voicemails left after 4:00 p.m. may not be returned until the following business day.  We are closed weekends and major holidays. You have access to a nurse at all times for urgent questions. Please call the main number to the clinic [phone] and follow the prompts.  For any non-urgent questions, you may also contact your provider using MyChart. We now offer e-Visits for anyone 38 and older to request care online for non-urgent symptoms. For details visit [email or web address].   Also download the MyChart app! Go to the app store, search "MyChart", open the app, select [facility], and log in with your MyChart username and password.  Masks are optional in the cancer centers. If you would like for your care team to wear a mask while they are taking care of you, please let them know. For doctor visits, patients may have with them one support person who is at least 86 years old. At this time, visitors are not allowed in the infusion area.

## 2021-12-06 NOTE — Progress Notes (Signed)
Patient presents today for Velcade injection per providers order.  Vital signs within parameters for treatment.  Labs pending.  Patient has no new complaints at this time.  Labs reviewed and ANC noted to be 1.4, MD notified.  Message received from Dr. Delton Coombes, okay to proceed with treatment.  Stable during administration without incident; injection site WNL; see MAR for injection details.  Patient tolerated procedure well and without incident.  No questions or complaints noted at this time. Discharge from clinic ambulatory in stable condition.  Alert and oriented X 3.  Follow up with [facility] as scheduled.

## 2021-12-07 LAB — KAPPA/LAMBDA LIGHT CHAINS
Kappa free light chain: 9.5 mg/L (ref 3.3–19.4)
Kappa, lambda light chain ratio: 0.01 — ABNORMAL LOW (ref 0.26–1.65)
Lambda free light chains: 679.2 mg/L — ABNORMAL HIGH (ref 5.7–26.3)

## 2021-12-10 LAB — PROTEIN ELECTROPHORESIS, SERUM
A/G Ratio: 0.7 (ref 0.7–1.7)
Albumin ELP: 3.7 g/dL (ref 2.9–4.4)
Alpha-1-Globulin: 0.2 g/dL (ref 0.0–0.4)
Alpha-2-Globulin: 0.6 g/dL (ref 0.4–1.0)
Beta Globulin: 0.6 g/dL — ABNORMAL LOW (ref 0.7–1.3)
Gamma Globulin: 3.5 g/dL — ABNORMAL HIGH (ref 0.4–1.8)
Globulin, Total: 5 g/dL — ABNORMAL HIGH (ref 2.2–3.9)
M-Spike, %: 3.4 g/dL — ABNORMAL HIGH
Total Protein ELP: 8.7 g/dL — ABNORMAL HIGH (ref 6.0–8.5)

## 2021-12-13 ENCOUNTER — Inpatient Hospital Stay: Payer: Medicare Other

## 2021-12-13 ENCOUNTER — Inpatient Hospital Stay (HOSPITAL_COMMUNITY): Payer: Medicare Other | Attending: Hematology | Admitting: Hematology

## 2021-12-13 VITALS — BP 140/58 | HR 53 | Temp 96.7°F | Resp 18

## 2021-12-13 VITALS — BP 149/71 | HR 57 | Temp 98.6°F | Resp 18 | Ht 68.0 in | Wt 124.1 lb

## 2021-12-13 DIAGNOSIS — D6481 Anemia due to antineoplastic chemotherapy: Secondary | ICD-10-CM | POA: Insufficient documentation

## 2021-12-13 DIAGNOSIS — C9000 Multiple myeloma not having achieved remission: Secondary | ICD-10-CM | POA: Insufficient documentation

## 2021-12-13 DIAGNOSIS — D630 Anemia in neoplastic disease: Secondary | ICD-10-CM | POA: Diagnosis not present

## 2021-12-13 DIAGNOSIS — D649 Anemia, unspecified: Secondary | ICD-10-CM

## 2021-12-13 LAB — COMPREHENSIVE METABOLIC PANEL
ALT: 16 U/L (ref 0–44)
AST: 20 U/L (ref 15–41)
Albumin: 2.9 g/dL — ABNORMAL LOW (ref 3.5–5.0)
Alkaline Phosphatase: 58 U/L (ref 38–126)
Anion gap: 2 — ABNORMAL LOW (ref 5–15)
BUN: 27 mg/dL — ABNORMAL HIGH (ref 8–23)
CO2: 26 mmol/L (ref 22–32)
Calcium: 8.8 mg/dL — ABNORMAL LOW (ref 8.9–10.3)
Chloride: 109 mmol/L (ref 98–111)
Creatinine, Ser: 0.96 mg/dL (ref 0.61–1.24)
GFR, Estimated: >60 mL/min (ref >60)
Glucose, Bld: 103 mg/dL — ABNORMAL HIGH (ref 70–99)
Potassium: 3.9 mmol/L (ref 3.5–5.1)
Sodium: 137 mmol/L (ref 135–145)
Total Bilirubin: 0.8 mg/dL (ref 0.3–1.2)
Total Protein: 8.8 g/dL — ABNORMAL HIGH (ref 6.5–8.1)

## 2021-12-13 LAB — CBC WITH DIFFERENTIAL/PLATELET
Abs Immature Granulocytes: 0.03 10*3/uL (ref 0.00–0.07)
Basophils Absolute: 0 10*3/uL (ref 0.0–0.1)
Basophils Relative: 0 %
Eosinophils Absolute: 0.2 10*3/uL (ref 0.0–0.5)
Eosinophils Relative: 6 %
HCT: 19.7 % — ABNORMAL LOW (ref 39.0–52.0)
Hemoglobin: 6.5 g/dL — CL (ref 13.0–17.0)
Immature Granulocytes: 1 %
Lymphocytes Relative: 22 %
Lymphs Abs: 0.6 10*3/uL — ABNORMAL LOW (ref 0.7–4.0)
MCH: 38 pg — ABNORMAL HIGH (ref 26.0–34.0)
MCHC: 33 g/dL (ref 30.0–36.0)
MCV: 115.2 fL — ABNORMAL HIGH (ref 80.0–100.0)
Monocytes Absolute: 0.7 10*3/uL (ref 0.1–1.0)
Monocytes Relative: 24 %
Neutro Abs: 1.3 10*3/uL — ABNORMAL LOW (ref 1.7–7.7)
Neutrophils Relative %: 47 %
Platelets: 108 10*3/uL — ABNORMAL LOW (ref 150–400)
RBC: 1.71 MIL/uL — ABNORMAL LOW (ref 4.22–5.81)
WBC: 2.9 10*3/uL — ABNORMAL LOW (ref 4.0–10.5)
nRBC: 0 % (ref 0.0–0.2)

## 2021-12-13 LAB — SAMPLE TO BLOOD BANK

## 2021-12-13 LAB — PREPARE RBC (CROSSMATCH)

## 2021-12-13 LAB — MAGNESIUM: Magnesium: 2.3 mg/dL (ref 1.7–2.4)

## 2021-12-13 MED ORDER — DIPHENHYDRAMINE HCL 25 MG PO CAPS
25.0000 mg | ORAL_CAPSULE | Freq: Once | ORAL | Status: AC
  Administered 2021-12-13: 25 mg via ORAL
  Filled 2021-12-13: qty 1

## 2021-12-13 MED ORDER — SODIUM CHLORIDE 0.9% IV SOLUTION
250.0000 mL | Freq: Once | INTRAVENOUS | Status: AC
  Administered 2021-12-13: 250 mL via INTRAVENOUS

## 2021-12-13 MED ORDER — ACETAMINOPHEN 325 MG PO TABS
650.0000 mg | ORAL_TABLET | Freq: Once | ORAL | Status: AC
  Administered 2021-12-13: 650 mg via ORAL
  Filled 2021-12-13: qty 2

## 2021-12-13 NOTE — Progress Notes (Signed)
Dale [name], [place] [id number]   CLINIC:  Medical Oncology/Hematology  PCP:  [facility], [place] / [place] [address] [phone]   REASON FOR VISIT:  Follow-up for multiple myeloma and anemia  PRIOR THERAPY: none  CURRENT THERAPY: Weekly Velcade and dexamethasone  BRIEF ONCOLOGIC HISTORY:  Oncology History  Multiple myeloma ([facility])  01/15/2021 Initial Diagnosis   Multiple myeloma ([facility])   06/25/2021 -  Chemotherapy   Patient is on Treatment Plan : MYELOMA NON-TRANSPLANT CANDIDATES VRd weekly q21d       CANCER STAGING:  Cancer Staging  No matching staging information was found for the patient.  INTERVAL HISTORY:  Dale [name], a 88 y.o. male, returns for follow-up of multiple myeloma.  He reports that he is tolerating Velcade injections weekly very well.  He did not have any major injection site problems.  He is taking dexamethasone 10 mg every Sunday.  Denies any fevers or infections.  Chronic hip pain is stable.  He had 1 episode of chest pain on exertion last week.   REVIEW OF SYSTEMS:  Review of Systems  Constitutional:  Negative for appetite change and fatigue.  Cardiovascular:  Positive for chest pain.  Gastrointestinal:  Negative for diarrhea.  Musculoskeletal:  Positive for arthralgias (6/10 L shoulder + L hip).  Neurological:  Negative for extremity weakness, headaches, light-headedness and numbness.  All other systems reviewed and are negative.   PAST MEDICAL/SURGICAL HISTORY:  Past Medical History:  Diagnosis Date   Allergic rhinitis    Anal fissure    Anginal pain (HCC)    Asthma    Cancer (HCC)    skin   Chest pain    CHF (congestive heart failure) ([facility])    Coronary heart disease    s/p stenting. cath in 01/2012 noncritical occlusion   Dysrhythmia    1st degree heart block   GERD (gastroesophageal reflux disease)    Glaucoma    Hiatal hernia    Hyperlipidemia    Hypertension     Hypothyroidism    Idiopathic thrombocytopenic purpura ([facility]) 2002   Macular degeneration    Nephrolithiasis    PUD (peptic ulcer disease)    remote   Sarcoidosis    pulmonary   Schatzki's ring    Past Surgical History:  Procedure Laterality Date   cardiac stents     COLONOSCOPY  10/30/2006   Normal rectum, sigmoid diverticula.Remainder of colonic mucosa appeared normal.   CORONARY ANGIOPLASTY WITH STENT PLACEMENT     about 10 years ago per pt (around 2007)   [place], URETEROSCOPY AND STENT PLACEMENT Left 06/16/2017   Procedure: CYSTOSCOPY WITH RETROGRADE PYELOGRAM, URETEROSCOPY,STONE EXTRACTION  AND STENT PLACEMENT;  Surgeon: Franchot Gallo, MD;  Location: [facility];  Service: Urology;  Laterality: Left;   ESOPHAGOGASTRODUODENOSCOPY  06/19/2004   Two esophageal rings and esophageal web as described above.  All of these were disrupted by passing 56-French Venia Minks dilator/ Candida esophagitis,which appears to be incidental given history of   antibiotic use, but nevertheless will be treated.   ESOPHAGOGASTRODUODENOSCOPY  10/30/2006   Distal tandem esophageal ring status post dilation disruption as  described above.  Otherwise normal esophagus/  Small hiatal hernia otherwise normal stomach, D1 and D2   ESOPHAGOGASTRODUODENOSCOPY N/A 03/22/2015   Dr.Rourk- noncritical schatzki's ring and hiatal hernia-o/w normal EGD.    ESOPHAGOGASTRODUODENOSCOPY (EGD) WITH ESOPHAGEAL DILATION  03/04/2012   RMR- schatzki's ring, hiatal hernia, polypoid gastric mucosa,  bx= minimally active gastritis.   HOLMIUM LASER APPLICATION Left 07/19/1759   Procedure: HOLMIUM LASER APPLICATION;  Surgeon: Franchot Gallo, MD;  Location: [facility];  Service: Urology;  Laterality: Left;   IR GENERIC HISTORICAL  03/06/2016   IR RADIOLOGIST EVAL & MGMT 03/06/2016 Aletta Edouard, MD [facility] INTERV RAD   IR GENERIC HISTORICAL  06/18/2016   IR RADIOLOGIST EVAL & MGMT 06/18/2016 Aletta Edouard, MD  [facility] INTERV RAD   IR RADIOLOGIST EVAL & MGMT  10/01/2016   IR RADIOLOGIST EVAL & MGMT  10/15/2017   IR RADIOLOGIST EVAL & MGMT  12/24/2018   IR RADIOLOGIST EVAL & MGMT  01/04/2020   IR RADIOLOGIST EVAL & MGMT  06/20/2021   LEFT HEART CATH N/A 02/02/2012   Procedure: LEFT HEART CATH;  Surgeon: Lorretta Harp, MD;  Location: [facility];  Service: Cardiovascular;  Laterality: N/A;   MEDIASTINOSCOPY     for dx sarcoid   RADIOLOGY WITH ANESTHESIA Left 05/17/2016   Procedure: left renal ablation;  Surgeon: Aletta Edouard, MD;  Location: [facility];  Service: Radiology;  Laterality: Left;    SOCIAL HISTORY:  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    Employer: RETIRED  Tobacco Use   Smoking status: Former    Packs/day: 0.10    Years: 2.00    Total pack years: 0.20    Types: Cigarettes, Cigars    Quit date: 05/06/1970    Years since quitting: 51.6   Smokeless tobacco: Never  Vaping Use   Vaping Use: Never used  Substance and Sexual Activity   Alcohol use: No    Alcohol/week: 0.0 standard drinks of alcohol   Drug use: No   Sexual activity: Never  Other Topics Concern   Not on file  Social History Narrative   Not on file   Social Determinants of Health   Financial Resource Strain: Not on file  Food Insecurity: No Food Insecurity (08/27/2021)   Hunger Vital Sign    Worried About Running Out of Food in the Last Year: Never true    Ran Out of Food in the Last Year: Never true  Transportation Needs: Not on file  Physical Activity: Not on file  Stress: Not on file  Social Connections: Not on file  Intimate Partner Violence: Not on file    FAMILY HISTORY:  Family History  Problem Relation Age of Onset   Heart disease Father        deceased age 51    Stroke Mother    Alzheimer's disease Mother    Heart attack Brother        deceased age 67   Cancer Other        niece   Colon cancer Neg Hx     CURRENT MEDICATIONS:  Current Outpatient Medications  Medication Sig Dispense Refill   acyclovir (ZOVIRAX) 400 MG tablet Take 1 tablet (400 mg total) by mouth 2 (two) times daily. 180 tablet 0   aspirin 81 MG tablet Take 81 mg by mouth every Monday, Wednesday, and Friday.     atorvastatin (LIPITOR) 40 MG tablet TAKE 1 TABLET BY MOUTH IN  THE EVENING 90 tablet 3   Bortezomib (VELCADE IJ) Inject as directed.  Three weeks on and one weeks off.     brimonidine (ALPHAGAN) 0.2 % ophthalmic solution Place 1 drop into both eyes 3 (three) times daily. Noon dosing only in right eye morning and evening in both eyes     dexamethasone (DECADRON) 2 MG tablet TAKE 5 TABLETS(10 MG) BY MOUTH 1 TIME A WEEK 60 tablet 0   dorzolamide-timolol (COSOPT) 22.3-6.8 MG/ML ophthalmic solution Place 1 drop into both eyes 2 (two) times daily.     furosemide (LASIX) 20 MG tablet If you notice any swelling, you may take 40 mg (two tablets) for the day. (Patient taking differently: Take 20 mg by mouth daily. If more swelling take an additional 20 mg in the afternoon) 90 tablet 3   isosorbide mononitrate (IMDUR) 60 MG 24 hr tablet TAKE 1 AND 1/2 TABLETS BY  MOUTH IN THE MORNING AND  1/2 TABLET AT NIGHT (Patient taking differently: TAKE 60 mg BY  MOUTH IN THE MORNING AND  30 mg TABLET AT NIGHT) 180 tablet 3   ketorolac (ACULAR) 0.5 % ophthalmic solution Apply to eye.     levothyroxine (SYNTHROID) 75 MCG tablet Take 75 mcg by mouth daily.     losartan (COZAAR) 100 MG tablet TAKE 1 TABLET BY MOUTH  DAILY 90 tablet 1   metoCLOPramide (REGLAN) 10 MG tablet Take 1 tablet (10 mg total) by mouth every 6 (six) hours as needed for nausea or vomiting. 15 tablet 0   metoprolol tartrate (LOPRESSOR) 25 MG tablet TAKE 1 TABLET BY MOUTH IN  THE MORNING AND ONE-HALF  TABLET BY MOUTH IN THE   EVENING 135 tablet 3   minocycline (MINOCIN) 100 MG capsule Take 100 mg by mouth 2 (two) times daily.     Multiple Vitamins-Minerals (PRESERVISION/LUTEIN) CAPS Take 1 capsule by mouth 2 (two) times daily.     nabumetone (RELAFEN) 500 MG tablet Take 500 mg by mouth 2 (two) times daily.     nitroGLYCERIN (NITROSTAT) 0.4 MG SL tablet Place 1 tablet (0.4 mg total) under the tongue every 5 (five) minutes as needed. For chest pain 25 tablet 2   pantoprazole (PROTONIX) 40 MG tablet TAKE 1 TABLET BY MOUTH  DAILY BEFORE BREAKFAST 90 tablet 1   potassium chloride SA (KLOR-CON M) 20 MEQ tablet TAKE 1 TABLET BY MOUTH  DAILY 90 tablet 3   prednisoLONE acetate (PRED FORTE) 1 % ophthalmic suspension Apply to eye.     prochlorperazine (COMPAZINE) 10 MG tablet Take 1 tablet (10 mg total) by mouth every 6 (six) hours as needed for nausea or vomiting. 30 tablet 3   ROCKLATAN 0.02-0.005 % SOLN Apply 1 drop to eye at bedtime.     triamcinolone (KENALOG) 0.1 % cream Apply 1 application. topically 2 (two) times daily as needed (for irritation).     vitamin C (ASCORBIC ACID) 500 MG tablet Take 500 mg by mouth daily.     vitamin E 200 UNIT capsule Take 200 Units by mouth daily.     No current facility-administered medications for this visit.    ALLERGIES:  Allergies  Allergen Reactions   Azithromycin Other (See Comments)    Sore mouth and fever blisters around mouth, sores in nose area as well   Doxazosin Shortness Of Breath   Acetaminophen Other (See Comments)    REACTION: UNKNOWN REACTION   Atenolol Other (See Comments)    REACTION: UNKNOWN REACTION   Hydrocodone Nausea And Vomiting   Hydrocodone-Acetaminophen Nausea Only   Levofloxacin Other (See Comments)  Caused stomach problems.   Morphine Other (See Comments)    "made me crazy"   Penicillins Nausea And Vomiting and Other (See Comments)    Has patient had a PCN reaction causing immediate rash, facial/tongue/throat swelling, SOB or lightheadedness  with hypotension: No Has patient had a PCN reaction causing severe rash involving mucus membranes or skin necrosis: No Has patient had a PCN reaction that required hospitalization No Has patient had a PCN reaction occurring within the last 10 years: No If all of the above answers are "NO", then may proceed with Cephalosporin use.    Sulfonamide Derivatives Nausea And Vomiting    PHYSICAL EXAM:  Performance status (ECOG): 0 - Asymptomatic  Vitals:   12/13/21 1125  BP: (!) 149/71  Pulse: (!) 57  Resp: 18  Temp: 98.6 F (37 C)  SpO2: 99%   Wt Readings from Last 3 Encounters:  12/13/21 124 lb 1.6 oz (56.3 kg)  11/29/21 126 lb 6.4 oz (57.3 kg)  11/28/21 127 lb 3.2 oz (57.7 kg)   Physical Exam Vitals reviewed.  Constitutional:      Appearance: Normal appearance.  Cardiovascular:     Rate and Rhythm: Normal rate and regular rhythm.     Pulses: Normal pulses.     Heart sounds: Normal heart sounds.  Pulmonary:     Effort: Pulmonary effort is normal.     Breath sounds: Normal breath sounds.  Neurological:     General: No focal deficit present.     Mental Status: He is alert and oriented to person, place, and time.  Psychiatric:        Mood and Affect: Mood normal.        Behavior: Behavior normal.     LABORATORY DATA:  I have reviewed the labs as listed.     Latest Ref Rng & Units 12/13/2021   10:51 AM 12/06/2021    8:35 AM 11/29/2021    8:33 AM  CBC  WBC 4.0 - 10.5 K/uL 2.9  3.0  3.2   Hemoglobin 13.0 - 17.0 g/dL 6.5  7.4  7.5   Hematocrit 39.0 - 52.0 % 19.7  22.9  22.9   Platelets 150 - 400 K/uL 108  88  92       Latest Ref Rng & Units 12/13/2021   10:51 AM 12/06/2021    8:35 AM 11/29/2021    8:33 AM  CMP  Glucose 70 - 99 mg/dL 103  76  82   BUN 8 - 23 mg/dL 27  43  32   Creatinine 0.61 - 1.24 mg/dL 0.96  1.13  1.00   Sodium 135 - 145 mmol/L 137  134  135   Potassium 3.5 - 5.1 mmol/L 3.9  3.6  4.4   Chloride 98 - 111 mmol/L 109  105  106   CO2 22 - 32 mmol/L 26   25  24    Calcium 8.9 - 10.3 mg/dL 8.8  8.8  8.8   Total Protein 6.5 - 8.1 g/dL 8.8  8.9  8.3   Total Bilirubin 0.3 - 1.2 mg/dL 0.8  1.0  0.9   Alkaline Phos 38 - 126 U/L 58  58  59   AST 15 - 41 U/L 20  18  15    ALT 0 - 44 U/L 16  19  20      DIAGNOSTIC IMAGING:  I have independently reviewed the scans and discussed with the patient. DG Tibia/Fibula Right  Result Date: 11/28/2021 Clinical:  pain  distal right tibia laterally with swelling X-rays were done of the right tibia, two views with BB over lesion There is soft tissue swelling of the distal third of the lateral leg but no bony changes.  No fracture is noted. Bone quality is good. Impression:  soft tissue swelling distal third of lateral right leg, no fracture noted. Electronically Signed Sanjuana Kava, MD 7/26/202310:44 AM    ASSESSMENT:  1.  IgG lambda plasma cell myeloma: - Work-up for macrocytic anemia on 12/01/2020 showed M spike of 2.9 g.  Immunofixation IgG lambda. - Lambda light chains elevated at 284.  Light chain ratio was 0.04.  LDH was 163.  Creatinine was 0.9 and calcium 8.9. - Bone marrow biopsy on 12/18/2020-hypercellular marrow for age with 48% atypical plasma cells with lambda light chain restriction. - Chromosome analysis was normal. 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 - Revlimid 5 mg, 2 weeks on/1 week off started on 01/20/2021.  Dexamethasone weekly 10 mg added on 02/07/2021.  Revlimid is discontinued around 06/15/2021 due to poor tolerance and ineffectiveness at low-dose.  Thrombocytopenia and anemia. - Velcade weekly on days 1, 8, 15 every 21 days along with dexamethasone 10 mg weekly started on 06/25/2021.  2.  Macrocytic anemia: - CBC on 12/01/2020 with hemoglobin 8.5 and MCV of 117. - Denies any bleeding per rectum or melena.  3.  Social/family history: - Lives at home with his wife.  He does all ADLs and IADLs.  He even  does yard work. - He worked at [employer] for 35 years.  Denies any chemical exposure.  Non-smoker. - No family history of malignancies.   PLAN:  1.  IgG lambda plasma cell myeloma, high risk: - He is receiving dose reduced Velcade 1 mg per metered square weekly without any major problems.  He is taking dexamethasone 10 mg on Sundays. - Reviewed myeloma labs from 12/06/2021 which showed M spike has increased to 3.4 g from 2.9 g.  Lambda light chains also increased to 679 with ratio of 0.01. - I have recommended change in therapy. - He was unable to tolerate Revlimid.  I have recommended adding daratumumab to Velcade and dexamethasone. - He does not have adequate subcutaneous tissue on the anterior abdominal wall. - I have recommended port placement by IR.  We will have to treat with daratumumab intravenously. - RTC 3 weeks for follow-up.   2.  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 - Hemoglobin today is 6.5. - We will arrange for 1 unit PRBC.  3.  Thromboprophylaxis: - Continue aspirin 81 mg daily.  4.  Right ankle swelling: - Continue Lasix as needed.  5.  ID prophylaxis: - Continue acyclovir twice daily.   Orders placed this encounter:  Orders Placed This Encounter  Procedures   IR IMAGING GUIDED PORT INSERTION      Derek Jack, MD [place] [phone]   I, Thana Ates, am acting as a scribe for Dr. Derek Jack.  I, Derek Jack MD, have reviewed the above documentation for accuracy and completeness, and I agree with the above.

## 2021-12-13 NOTE — Patient Instructions (Addendum)
[place] at [facility] Discharge Instructions   You were seen and examined today by Dr. Delton Coombes.  He reviewed your myeloma labs. Your m-spike is continuing to go higher so we need to add another medication to help treat your myeloma. The medication we are planning to give is called Darzalex. This is an IV medication which will require you having a port a cath placed. This will be done in interventional radiology in [place] at [facility]. Darzalex is not a chemotherapy drug but an immunotherapy drug that helps your body's immune system fight the multiple myeloma. You will continue to receive the Velcade injection you've been getting.   We will plan to start this new treatment next week.      Thank you for choosing [place] at [facility] to provide your oncology and hematology care.  To afford each patient quality time with our provider, please arrive at least 15 minutes before your scheduled appointment time.   If you have a lab appointment with the [facility] please come in thru the Main Entrance and check in at the main information desk.  You need to re-schedule your appointment should you arrive 10 or more minutes late.  We strive to give you quality time with our providers, and arriving late affects you and other patients whose appointments are after yours.  Also, if you no show three or more times for appointments you may be dismissed from the clinic at the providers discretion.     Again, thank you for choosing [facility].  Our hope is that these requests will decrease the amount of time that you wait before being seen by our physicians.       _____________________________________________________________  Should you have questions after your visit to [facility], please contact our office at [phone] and follow the prompts.  Our office hours are 8:00 a.m. and 4:30 p.m. Monday - Friday.   Please note that voicemails left after 4:00 p.m. may not be returned until the following business day.  We are closed weekends and major holidays.  You do have access to a nurse 24-7, just call the main number to the clinic [phone] and do not press any options, hold on the line and a nurse will answer the phone.    For prescription refill requests, have your pharmacy contact our office and allow 72 hours.    Due to Covid, you will need to wear a mask upon entering the hospital. If you do not have a mask, a mask will be given to you at the Main Entrance upon arrival. For doctor visits, patients may have 1 support person age 86 or older with them. For treatment visits, patients can not have anyone with them due to social distancing guidelines and our immunocompromised population.

## 2021-12-13 NOTE — Progress Notes (Signed)
DISCONTINUE ON PATHWAY REGIMEN - Multiple Myeloma and Other Plasma Cell Dyscrasias     A cycle is every 21 days:     Bortezomib      Lenalidomide      Dexamethasone   **Always confirm dose/schedule in your pharmacy ordering system**  REASON: Disease Progression PRIOR TREATMENT: [id number]: VRd (Bortezomib 1.3 mg/m2 SUBQ D1, 8, 15 + Lenalidomide 25 mg + Dexamethasone 40 mg) q21 Days x 8 Cycles TREATMENT RESPONSE: Progressive Disease (PD)  START ON PATHWAY REGIMEN - Multiple Myeloma and Other Plasma Cell Dyscrasias     Cycles 1 and 2: A cycle is every 28 days:     Daratumumab    Cycles 3 through 6: A cycle is every 28 days:     Daratumumab    Cycles 7 and beyond: A cycle is every 28 days:     Daratumumab   **Always confirm dose/schedule in your pharmacy ordering system**  Patient Characteristics: Multiple Myeloma, Relapsed / Refractory, Second through Fourth Lines of Therapy, Frail or Not a Candidate for Triplet Therapy Disease Classification: Multiple Myeloma R-ISS Staging: II Therapeutic Status: Relapsed Line of Therapy: Third Line Intent of Therapy: Non-Curative / Palliative Intent, Discussed with Patient

## 2021-12-13 NOTE — Progress Notes (Signed)
CRITICAL VALUE ALERT Critical value received:  HGB 6.5 Date of notification:  12-13-2021 Time of notification: 11:38 am.  Critical value read back:  Yes.   Nurse who received alert:  B. Ree Alcalde Rn  MD notified time and response:  Katragadda @ 11:56am. Patient to receive 1 unit of blood today.

## 2021-12-13 NOTE — Progress Notes (Signed)
Pt presents today for Velcade and possible blood per provider's order. Pt's hemoglobin is 6.5 today. Message received from Dr.K that stated no Velcade today, and give 1 unit of blood.  1 unit of blood  given today per MD orders. Tolerated infusion without adverse affects. Vital signs stable. No complaints at this time. Discharged from clinic ambulatory in stable condition. Alert and oriented x 3. F/U with [facility] as scheduled.

## 2021-12-13 NOTE — Patient Instructions (Signed)
[place]  Discharge Instructions: Thank you for choosing [place] to provide your oncology and hematology care.  If you have a lab appointment with the [facility], please come in thru the Main Entrance and check in at the main information desk.  Wear comfortable clothing and clothing appropriate for easy access to any Portacath or PICC line.   We strive to give you quality time with your provider. You may need to reschedule your appointment if you arrive late (15 or more minutes).  Arriving late affects you and other patients whose appointments are after yours.  Also, if you miss three or more appointments without notifying the office, you may be dismissed from the clinic at the provider's discretion.      For prescription refill requests, have your pharmacy contact our office and allow 72 hours for refills to be completed.    Today you received 1 unit of blood.   BELOW ARE SYMPTOMS THAT SHOULD BE REPORTED IMMEDIATELY: *FEVER GREATER THAN 100.4 F (38 C) OR HIGHER *CHILLS OR SWEATING *NAUSEA AND VOMITING THAT IS NOT CONTROLLED WITH YOUR NAUSEA MEDICATION *UNUSUAL SHORTNESS OF BREATH *UNUSUAL BRUISING OR BLEEDING *URINARY PROBLEMS (pain or burning when urinating, or frequent urination) *BOWEL PROBLEMS (unusual diarrhea, constipation, pain near the anus) TENDERNESS IN MOUTH AND THROAT WITH OR WITHOUT PRESENCE OF ULCERS (sore throat, sores in mouth, or a toothache) UNUSUAL RASH, SWELLING OR PAIN  UNUSUAL VAGINAL DISCHARGE OR ITCHING   Items with * indicate a potential emergency and should be followed up as soon as possible or go to the Emergency Department if any problems should occur.  Please show the CHEMOTHERAPY ALERT CARD or IMMUNOTHERAPY ALERT CARD at check-in to the Emergency Department and triage nurse.  Should you have questions after your visit or need to cancel or reschedule your appointment, please contact [place] [phone]  and follow the prompts.  Office hours are 8:00 a.m. to 4:30 p.m. Monday - Friday. Please note that voicemails left after 4:00 p.m. may not be returned until the following business day.  We are closed weekends and major holidays. You have access to a nurse at all times for urgent questions. Please call the main number to the clinic [phone] and follow the prompts.  For any non-urgent questions, you may also contact your provider using MyChart. We now offer e-Visits for anyone 7 and older to request care online for non-urgent symptoms. For details visit [email or web address].   Also download the MyChart app! Go to the app store, search "MyChart", open the app, select [facility], and log in with your MyChart username and password.  Masks are optional in the cancer centers. If you would like for your care team to wear a mask while they are taking care of you, please let them know. For doctor visits, patients may have with them one support person who is at least 86 years old. At this time, visitors are not allowed in the infusion area.

## 2021-12-14 LAB — TYPE AND SCREEN
ABO/RH(D): O POS
Antibody Screen: NEGATIVE
Unit division: 0

## 2021-12-14 LAB — BPAM RBC
Blood Product Expiration Date: 202309092359
ISSUE DATE / TIME: 202308101350
Unit Type and Rh: 5100

## 2021-12-17 ENCOUNTER — Other Ambulatory Visit: Payer: Self-pay | Admitting: Radiology

## 2021-12-17 DIAGNOSIS — C9000 Multiple myeloma not having achieved remission: Secondary | ICD-10-CM

## 2021-12-18 ENCOUNTER — Other Ambulatory Visit: Payer: Self-pay

## 2021-12-18 ENCOUNTER — Encounter (HOSPITAL_COMMUNITY): Payer: Self-pay

## 2021-12-18 ENCOUNTER — Ambulatory Visit (HOSPITAL_COMMUNITY)
Admission: RE | Admit: 2021-12-18 | Discharge: 2021-12-18 | Disposition: A | Discharge status: Home / Self Care | Payer: Medicare Other | Source: Ambulatory Visit | Attending: Hematology | Admitting: Hematology

## 2021-12-18 DIAGNOSIS — C9000 Multiple myeloma not having achieved remission: Secondary | ICD-10-CM | POA: Diagnosis not present

## 2021-12-18 DIAGNOSIS — D649 Anemia, unspecified: Secondary | ICD-10-CM | POA: Insufficient documentation

## 2021-12-18 DIAGNOSIS — Z452 Encounter for adjustment and management of vascular access device: Secondary | ICD-10-CM | POA: Diagnosis not present

## 2021-12-18 HISTORY — PX: IR IMAGING GUIDED PORT INSERTION: IMG5740

## 2021-12-18 MED ORDER — MIDAZOLAM HCL 2 MG/2ML IJ SOLN
INTRAMUSCULAR | Status: AC
  Filled 2021-12-18: qty 2

## 2021-12-18 MED ORDER — HEPARIN SOD (PORK) LOCK FLUSH 100 UNIT/ML IV SOLN
INTRAVENOUS | Status: AC
  Filled 2021-12-18: qty 5

## 2021-12-18 MED ORDER — LIDOCAINE-EPINEPHRINE 1 %-1:100000 IJ SOLN
INTRAMUSCULAR | Status: AC | PRN
  Administered 2021-12-18: 20 mL

## 2021-12-18 MED ORDER — LIDOCAINE-EPINEPHRINE 1 %-1:100000 IJ SOLN
INTRAMUSCULAR | Status: AC
  Filled 2021-12-18: qty 1

## 2021-12-18 MED ORDER — FENTANYL CITRATE (PF) 100 MCG/2ML IJ SOLN
INTRAMUSCULAR | Status: AC
  Filled 2021-12-18: qty 2

## 2021-12-18 MED ORDER — MIDAZOLAM HCL 2 MG/2ML IJ SOLN
INTRAMUSCULAR | Status: AC | PRN
  Administered 2021-12-18: .5 mg via INTRAVENOUS

## 2021-12-18 MED ORDER — FENTANYL CITRATE (PF) 100 MCG/2ML IJ SOLN
INTRAMUSCULAR | Status: AC | PRN
  Administered 2021-12-18: 25 ug via INTRAVENOUS

## 2021-12-18 MED ORDER — SODIUM CHLORIDE 0.9 % IV SOLN: INTRAVENOUS | Status: DC

## 2021-12-18 NOTE — Procedures (Signed)
Interventional Radiology Procedure Note ° °Procedure: Single Lumen Power Port Placement   ° °Access:  Right internal jugular vein ° °Findings: Catheter tip positioned at cavoatrial junction. Port is ready for immediate use.  ° °Complications: None ° °EBL: < 10 mL ° °Recommendations:  °- Ok to shower in 24 hours °- Do not submerge for 7 days °- Routine line care  ° ° °Cullen Lahaie, MD ° ° ° °

## 2021-12-18 NOTE — H&P (Signed)
Chief Complaint: Patient was seen in consultation today for [facility] a cath placement at the request of Katragadda,Sreedhar  Referring Physician(s): Firefighter  Supervising Physician: Ruthann Cancer  Patient Status: [facility] - Out-pt  History of Present Illness: Dale [name] is a 88 y.o. male   Multiple Myeloma and anemia  IgG lambda plasma cell myeloma, high risk: - He is receiving dose reduced Velcade 1 mg per metered square weekly without any major problems.  He is taking dexamethasone 10 mg on Sundays. - Reviewed myeloma labs from 12/06/2021 which showed M spike has increased to 3.4 g from 2.9 g.  Lambda light chains also increased to 679 with ratio of 0.01. - I have recommended change in therapy. - He was unable to tolerate Revlimid.  I have recommended adding daratumumab to Velcade and dexamethasone. - He does not have adequate subcutaneous tissue on the anterior abdominal wall. - I have recommended port placement by IR.  We will have to treat with daratumumab intravenously.   Scheduled now for [facility] placement  Past Medical History:  Diagnosis Date   Allergic rhinitis    Anal fissure    Anginal pain (HCC)    Asthma    Cancer ([place])    skin   Chest pain    CHF (congestive heart failure) ([place])    Coronary heart disease    s/p stenting. cath in 01/2012 noncritical occlusion   Dysrhythmia    1st degree heart block   GERD (gastroesophageal reflux disease)    Glaucoma    Hiatal hernia    Hyperlipidemia    Hypertension    Hypothyroidism    Idiopathic thrombocytopenic purpura ([place]) 2002   Macular degeneration    Nephrolithiasis    PUD (peptic ulcer disease)    remote   Sarcoidosis    pulmonary   Schatzki's ring     Past Surgical History:  Procedure Laterality Date   cardiac stents     COLONOSCOPY  10/30/2006   Normal rectum, sigmoid diverticula.Remainder of colonic mucosa appeared normal.   CORONARY ANGIOPLASTY WITH STENT PLACEMENT     about 10 years ago  per pt (around 2007)   [place], URETEROSCOPY AND STENT PLACEMENT Left 06/16/2017   Procedure: CYSTOSCOPY WITH RETROGRADE PYELOGRAM, URETEROSCOPY,STONE EXTRACTION  AND STENT PLACEMENT;  Surgeon: Franchot Gallo, MD;  Location: [facility] ORS;  Service: Urology;  Laterality: Left;   ESOPHAGOGASTRODUODENOSCOPY  06/19/2004   Two esophageal rings and esophageal web as described above.  All of these were disrupted by passing 56-French Venia Minks dilator/ Candida esophagitis,which appears to be incidental given history of   antibiotic use, but nevertheless will be treated.   ESOPHAGOGASTRODUODENOSCOPY  10/30/2006   Distal tandem esophageal ring status post dilation disruption as  described above.  Otherwise normal esophagus/  Small hiatal hernia otherwise normal stomach, D1 and D2   ESOPHAGOGASTRODUODENOSCOPY N/A 03/22/2015   Dr.Rourk- noncritical schatzki's ring and hiatal hernia-o/w normal EGD.    ESOPHAGOGASTRODUODENOSCOPY (EGD) WITH ESOPHAGEAL DILATION  03/04/2012   RMR- schatzki's ring, hiatal hernia, polypoid gastric mucosa, bx= minimally active gastritis.   HOLMIUM LASER APPLICATION Left 12/14/313   Procedure: HOLMIUM LASER APPLICATION;  Surgeon: Franchot Gallo, MD;  Location: [facility];  Service: Urology;  Laterality: Left;   IR GENERIC HISTORICAL  03/06/2016   IR RADIOLOGIST EVAL & MGMT 03/06/2016 Aletta Edouard, MD [facility] INTERV RAD   IR GENERIC HISTORICAL  06/18/2016   IR RADIOLOGIST EVAL & MGMT 06/18/2016 Aletta Edouard, MD [facility] INTERV RAD   IR  RADIOLOGIST EVAL & MGMT  10/01/2016   IR RADIOLOGIST EVAL & MGMT  10/15/2017   IR RADIOLOGIST EVAL & MGMT  12/24/2018   IR RADIOLOGIST EVAL & MGMT  01/04/2020   IR RADIOLOGIST EVAL & MGMT  06/20/2021   LEFT HEART CATH N/A 02/02/2012   Procedure: LEFT HEART CATH;  Surgeon: Lorretta Harp, MD;  Location: [facility] CATH LAB;  Service: Cardiovascular;  Laterality: N/A;   MEDIASTINOSCOPY     for dx sarcoid   RADIOLOGY WITH ANESTHESIA Left  05/17/2016   Procedure: left renal ablation;  Surgeon: Aletta Edouard, MD;  Location: [facility];  Service: Radiology;  Laterality: Left;    Allergies: Azithromycin, Doxazosin, Atenolol, Hydrocodone, Levofloxacin, Morphine, Penicillins, and Sulfonamide derivatives  Medications: Prior to Admission medications   Medication Sig Start Date End Date Taking? Authorizing Provider  acyclovir (ZOVIRAX) 400 MG tablet Take 1 tablet (400 mg total) by mouth 2 (two) times daily. 11/08/21  Yes Derek Jack, MD  Ascorbic Acid (VITAMIN C PO) Take 500 mg by mouth every evening.   Yes [provider]  aspirin EC 81 MG tablet Take 81 mg by mouth every Monday, Wednesday, and Friday. Swallow whole.   Yes [provider]  atorvastatin (LIPITOR) 40 MG tablet TAKE 1 TABLET BY MOUTH IN  THE EVENING 09/10/21  Yes Troy Sine, MD  Bortezomib (VELCADE IJ) Inject as directed once a week.   Yes [provider]  brimonidine (ALPHAGAN) 0.2 % ophthalmic solution Place 1 drop into the left eye 2 (two) times daily.   Yes [provider]  dexamethasone (DECADRON) 2 MG tablet TAKE 5 TABLETS(10 MG) BY MOUTH 1 TIME A WEEK 11/08/21  Yes Derek Jack, MD  dorzolamide-timolol (COSOPT) 22.3-6.8 MG/ML ophthalmic solution Place 1 drop into the left eye 2 (two) times daily.   Yes [provider]  furosemide (LASIX) 20 MG tablet If you notice any swelling, you may take 40 mg (two tablets) for the day. Patient taking differently: Take 20 mg by mouth 2 (two) times daily. 03/28/21  Yes Troy Sine, MD  isosorbide mononitrate (IMDUR) 60 MG 24 hr tablet TAKE 1 AND 1/2 TABLETS BY  MOUTH IN THE MORNING AND  1/2 TABLET AT NIGHT Patient taking differently: TAKE 60 mg BY  MOUTH IN THE MORNING AND  30 mg TABLET AT NIGHT 07/02/21  Yes Troy Sine, MD  levothyroxine (SYNTHROID) 75 MCG tablet Take 75 mcg by mouth daily before breakfast. 07/16/21  Yes [provider]  losartan (COZAAR)  100 MG tablet TAKE 1 TABLET BY MOUTH  DAILY 09/21/21  Yes Troy Sine, MD  metoprolol tartrate (LOPRESSOR) 25 MG tablet TAKE 1 TABLET BY MOUTH IN  THE MORNING AND ONE-HALF  TABLET BY MOUTH IN THE  EVENING 07/13/21  Yes Troy Sine, MD  Multiple Vitamins-Minerals (PRESERVISION/LUTEIN) CAPS Take 1 capsule by mouth 2 (two) times daily.   Yes [provider]  pantoprazole (PROTONIX) 40 MG tablet TAKE 1 TABLET BY MOUTH  DAILY BEFORE BREAKFAST 07/27/21  Yes Rourk, Cristopher Estimable, MD  potassium chloride SA (KLOR-CON M) 20 MEQ tablet TAKE 1 TABLET BY MOUTH  DAILY 05/28/21  Yes Troy Sine, MD  ROCKLATAN 0.02-0.005 % SOLN Place 1 drop into the left eye at bedtime. 12/21/19  Yes [provider]  vitamin E 200 UNIT capsule Take 200 Units by mouth every evening.   Yes [provider]  metoCLOPramide (REGLAN) 10 MG tablet Take 1 tablet (10 mg total) by  mouth every 6 (six) hours as needed for nausea or vomiting. 05/23/21   Evalee Jefferson, PA-C  nitroGLYCERIN (NITROSTAT) 0.4 MG SL tablet Place 1 tablet (0.4 mg total) under the tongue every 5 (five) minutes as needed. For chest pain 09/27/20   Troy Sine, MD  prochlorperazine (COMPAZINE) 10 MG tablet Take 1 tablet (10 mg total) by mouth every 6 (six) hours as needed for nausea or vomiting. 01/15/21   Derek Jack, MD  triamcinolone (KENALOG) 0.1 % cream Apply 1 application. topically 2 (two) times daily as needed (for irritation).    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    Employer: RETIRED  Tobacco Use   Smoking status: Former    Packs/day: 0.10    Years: 2.00    Total pack years: 0.20    Types: Cigarettes, Cigars    Quit date: 05/06/1970    Years since quitting: 51.6   Smokeless tobacco: Never  Vaping Use   Vaping Use: Never used  Substance and Sexual Activity   Alcohol use: No    Alcohol/week: 0.0 standard drinks of alcohol   Drug use: No   Sexual activity: Never  Other Topics Concern   Not on file  Social History Narrative   Not on file   Social Determinants of Health   Financial Resource Strain: Not on file  Food Insecurity: No Food Insecurity (08/27/2021)   Hunger Vital Sign    Worried About Running Out of Food in the Last Year: Never true    Ran Out of Food in the Last Year: Never true  Transportation Needs: Not on file  Physical Activity: Not on file  Stress: Not on file  Social Connections: Not on file    Review of Systems: A 12 point ROS discussed and pertinent positives are indicated in the HPI above.  All other systems are negative.  Review of Systems  Constitutional:  Negative for fatigue and fever.  Respiratory:  Negative for cough and shortness of breath.   Gastrointestinal:  Negative for nausea.  Neurological:  Negative for weakness.  Psychiatric/Behavioral:  Negative for behavioral problems and confusion.     Vital Signs: BP (!) 176/70   Pulse 72   Temp 97.6 F (36.4 C) (Temporal)   Resp 16   Ht _0  (1.727 m)   Wt 127 lb (57.6 kg)   SpO2 100%   BMI 19.31 kg/m     Physical Exam HENT:     Mouth/Throat:     Mouth: Mucous membranes are moist.  Cardiovascular:     Rate and Rhythm: Normal rate and regular rhythm.  Pulmonary:     Effort: Pulmonary effort is normal.     Breath sounds: Normal breath sounds.  Abdominal:  Palpations: Abdomen is soft.  Musculoskeletal:        General: Normal range of motion.  Skin:    General: Skin is warm.   Neurological:     Mental Status: He is alert and oriented to person, place, and time.  Psychiatric:        Behavior: Behavior normal.     Imaging: DG Tibia/Fibula Right  Result Date: 11/28/2021 Clinical:  pain distal right tibia laterally with swelling X-rays were done of the right tibia, two views with BB over lesion There is soft tissue swelling of the distal third of the lateral leg but no bony changes.  No fracture is noted. Bone quality is good. Impression:  soft tissue swelling distal third of lateral right leg, no fracture noted. Electronically Signed Sanjuana Kava, MD 7/26/202310:44 AM   Labs:  CBC: Recent Labs    11/22/21 5053 11/29/21 9767 12/06/21 0835 12/13/21 1051  WBC 3.5* 3.2* 3.0* 2.9*  HGB 7.5* 7.5* 7.4* 6.5*  HCT 23.0* 22.9* 22.9* 19.7*  PLT 59* 92* 88* 108*    COAGS: Recent Labs    07/27/21 1604  INR 1.4*    BMP: Recent Labs    11/22/21 0816 11/29/21 0833 12/06/21 0835 12/13/21 1051  NA 135 135 134* 137  K 3.9 4.4 3.6 3.9  CL 105 106 105 109  CO2 _0 GLUCOSE 105* 82 76 103*  BUN 32* 32* 43* 27*  CALCIUM 8.9 8.8* 8.8* 8.8*  CREATININE 0.87 1.00 1.13 0.96  GFRNONAA >60 >60 >60 >60    LIVER FUNCTION TESTS: Recent Labs    11/22/21 0816 11/29/21 0833 12/06/21 0835 12/13/21 1051  BILITOT 0.7 0.9 1.0 0.8  AST _1 ALT _2 ALKPHOS 55 59 58 58  PROT 8.4* 8.3* 8.9* 8.8*  ALBUMIN 2.9* 3.1* 3.1* 2.9*    TUMOR MARKERS: No results for input(s): "AFPTM", "CEA", "CA199", "CHROMGRNA" in the last 8760 hours.  Assessment and Plan:  Multiple Myeloma Additional meds to regimen per Dr Delton Coombes [facility] a cath for same Risks and benefits of image guided port-a-catheter placement was discussed with the patient including, but not limited to bleeding, infection, pneumothorax, or fibrin sheath development and need for additional procedures.  All of the patient's questions were answered, patient is agreeable to  proceed. Consent signed and in chart.   Thank you for this interesting consult.  I greatly enjoyed meeting ALEXANDRO LINE and look forward to participating in their care.  A copy of this report was sent to the requesting provider on this date.  Electronically Signed: Lavonia Drafts, PA-C 12/18/2021, 10:46 AM   I spent a total of  30 Minutes   in face to face in clinical consultation, greater than 50% of which was counseling/coordinating care for [facility] a cath

## 2021-12-20 ENCOUNTER — Other Ambulatory Visit: Payer: Self-pay

## 2021-12-20 ENCOUNTER — Inpatient Hospital Stay: Payer: Medicare Other

## 2021-12-20 VITALS — BP 142/70 | HR 88 | Temp 97.9°F | Resp 17

## 2021-12-20 DIAGNOSIS — C9000 Multiple myeloma not having achieved remission: Secondary | ICD-10-CM

## 2021-12-20 DIAGNOSIS — Z5112 Encounter for antineoplastic immunotherapy: Secondary | ICD-10-CM | POA: Diagnosis not present

## 2021-12-20 DIAGNOSIS — Z79899 Other long term (current) drug therapy: Secondary | ICD-10-CM | POA: Diagnosis not present

## 2021-12-20 LAB — COMPREHENSIVE METABOLIC PANEL
ALT: 18 U/L (ref 0–44)
AST: 20 U/L (ref 15–41)
Albumin: 2.9 g/dL — ABNORMAL LOW (ref 3.5–5.0)
Alkaline Phosphatase: 60 U/L (ref 38–126)
Anion gap: 5 (ref 5–15)
BUN: 28 mg/dL — ABNORMAL HIGH (ref 8–23)
CO2: 24 mmol/L (ref 22–32)
Calcium: 8.8 mg/dL — ABNORMAL LOW (ref 8.9–10.3)
Chloride: 105 mmol/L (ref 98–111)
Creatinine, Ser: 1.03 mg/dL (ref 0.61–1.24)
GFR, Estimated: >60 mL/min (ref >60)
Glucose, Bld: 113 mg/dL — ABNORMAL HIGH (ref 70–99)
Potassium: 3.8 mmol/L (ref 3.5–5.1)
Sodium: 134 mmol/L — ABNORMAL LOW (ref 135–145)
Total Bilirubin: 0.7 mg/dL (ref 0.3–1.2)
Total Protein: 8.8 g/dL — ABNORMAL HIGH (ref 6.5–8.1)

## 2021-12-20 LAB — CBC WITH DIFFERENTIAL/PLATELET
Abs Immature Granulocytes: 0.03 10*3/uL (ref 0.00–0.07)
Basophils Absolute: 0 10*3/uL (ref 0.0–0.1)
Basophils Relative: 0 %
Eosinophils Absolute: 0.2 10*3/uL (ref 0.0–0.5)
Eosinophils Relative: 6 %
HCT: 22.2 % — ABNORMAL LOW (ref 39.0–52.0)
Hemoglobin: 7.3 g/dL — ABNORMAL LOW (ref 13.0–17.0)
Immature Granulocytes: 1 %
Lymphocytes Relative: 20 %
Lymphs Abs: 0.6 10*3/uL — ABNORMAL LOW (ref 0.7–4.0)
MCH: 35.3 pg — ABNORMAL HIGH (ref 26.0–34.0)
MCHC: 32.9 g/dL (ref 30.0–36.0)
MCV: 107.2 fL — ABNORMAL HIGH (ref 80.0–100.0)
Monocytes Absolute: 0.5 10*3/uL (ref 0.1–1.0)
Monocytes Relative: 15 %
Neutro Abs: 1.8 10*3/uL (ref 1.7–7.7)
Neutrophils Relative %: 58 %
Platelets: 108 10*3/uL — ABNORMAL LOW (ref 150–400)
RBC: 2.07 MIL/uL — ABNORMAL LOW (ref 4.22–5.81)
WBC: 3.1 10*3/uL — ABNORMAL LOW (ref 4.0–10.5)
nRBC: 0 % (ref 0.0–0.2)

## 2021-12-20 LAB — MAGNESIUM: Magnesium: 2.2 mg/dL (ref 1.7–2.4)

## 2021-12-20 LAB — TYPE AND SCREEN
ABO/RH(D): O POS
Antibody Screen: NEGATIVE

## 2021-12-20 LAB — SAMPLE TO BLOOD BANK

## 2021-12-20 MED ORDER — SODIUM CHLORIDE 0.9 % IV SOLN: Freq: Once | INTRAVENOUS | Status: AC

## 2021-12-20 MED ORDER — ACETAMINOPHEN 325 MG PO TABS
650.0000 mg | ORAL_TABLET | Freq: Once | ORAL | Status: AC
  Administered 2021-12-20: 650 mg via ORAL

## 2021-12-20 MED ORDER — MONTELUKAST SODIUM 10 MG PO TABS
10.0000 mg | ORAL_TABLET | Freq: Once | ORAL | Status: AC
  Administered 2021-12-20: 10 mg via ORAL

## 2021-12-20 MED ORDER — MONTELUKAST SODIUM 10 MG PO TABS
ORAL_TABLET | ORAL | Status: AC
  Filled 2021-12-20: qty 1

## 2021-12-20 MED ORDER — METHYLPREDNISOLONE SODIUM SUCC 125 MG IJ SOLR: 100.0000 mg | Freq: Once | INTRAMUSCULAR | Status: DC

## 2021-12-20 MED ORDER — SODIUM CHLORIDE 0.9% FLUSH
10.0000 mL | INTRAVENOUS | Status: DC | PRN
  Administered 2021-12-20: 10 mL

## 2021-12-20 MED ORDER — SODIUM CHLORIDE 0.9 % IV SOLN
16.0000 mg/kg | Freq: Once | INTRAVENOUS | Status: AC
  Administered 2021-12-20: 900 mg via INTRAVENOUS
  Filled 2021-12-20: qty 40

## 2021-12-20 MED ORDER — DIPHENHYDRAMINE HCL 25 MG PO CAPS
50.0000 mg | ORAL_CAPSULE | Freq: Once | ORAL | Status: AC
  Administered 2021-12-20: 50 mg via ORAL

## 2021-12-20 MED ORDER — PROCHLORPERAZINE MALEATE 10 MG PO TABS: 10.0000 mg | ORAL_TABLET | Freq: Four times a day (QID) | ORAL | 3 refills | Status: DC | PRN

## 2021-12-20 MED ORDER — DIPHENHYDRAMINE HCL 25 MG PO CAPS
ORAL_CAPSULE | ORAL | Status: AC
  Filled 2021-12-20: qty 2

## 2021-12-20 MED ORDER — BORTEZOMIB CHEMO SQ INJECTION 3.5 MG (2.5MG/ML)
1.0000 mg/m2 | Freq: Once | INTRAMUSCULAR | Status: AC
  Administered 2021-12-20: 1.75 mg via SUBCUTANEOUS
  Filled 2021-12-20: qty 0.7

## 2021-12-20 MED ORDER — HEPARIN SOD (PORK) LOCK FLUSH 100 UNIT/ML IV SOLN
500.0000 [IU] | Freq: Once | INTRAVENOUS | Status: AC | PRN
  Administered 2021-12-20: 500 [IU]

## 2021-12-20 MED ORDER — LIDOCAINE-PRILOCAINE 2.5-2.5 % EX CREA: 1.0000 | TOPICAL_CREAM | CUTANEOUS | 1 refills | Status: DC | PRN

## 2021-12-20 MED ORDER — METHYLPREDNISOLONE SODIUM SUCC 125 MG IJ SOLR
INTRAMUSCULAR | Status: AC
  Administered 2021-12-20: 125 mg
  Filled 2021-12-20: qty 2

## 2021-12-20 MED ORDER — ACETAMINOPHEN 325 MG PO TABS
ORAL_TABLET | ORAL | Status: AC
  Filled 2021-12-20: qty 2

## 2021-12-20 NOTE — Patient Instructions (Signed)
[facility]  Discharge Instructions: Thank you for choosing [facility] to provide your oncology and hematology care.  If you have a lab appointment with the [facility], please come in thru the Main Entrance and check in at the main information desk.  Wear comfortable clothing and clothing appropriate for easy access to any Portacath or PICC line.   We strive to give you quality time with your provider. You may need to reschedule your appointment if you arrive late (15 or more minutes).  Arriving late affects you and other patients whose appointments are after yours.  Also, if you miss three or more appointments without notifying the office, you may be dismissed from the clinic at the provider's discretion.      For prescription refill requests, have your pharmacy contact our office and allow 72 hours for refills to be completed.    Today you received the following chemotherapy and/or immunotherapy agents Daratumumb IV . Daratumumab Injection What is this medication? DARATUMUMAB (dar a toom ue mab) treats multiple myeloma, a type of bone marrow cancer. It works by helping your immune system slow or stop the spread of cancer cells. It is a monoclonal antibody. This medicine may be used for other purposes; ask your health care provider or pharmacist if you have questions. COMMON BRAND NAME(S): DARZALEX What should I tell my care team before I take this medication? They need to know if you have any of these conditions: Hereditary fructose intolerance Infection, such as chickenpox, herpes, hepatitis B virus Lung or breathing disease, such as asthma, COPD An unusual or allergic reaction to daratumumab, sorbitol, other medications, foods, dyes, or preservatives Pregnant or trying to get pregnant Breast-feeding How should I use this medication? This medication is injected into a vein. It is given by your care team in a hospital or clinic setting. Talk to your  care team about the use of this medication in children. Special care may be needed. Overdosage: If you think you have taken too much of this medicine contact a poison control center or emergency room at once. NOTE: This medicine is only for you. Do not share this medicine with others. What if I miss a dose? Keep appointments for follow-up doses. It is important not to miss your dose. Call your care team if you are unable to keep an appointment. What may interact with this medication? Interactions have not been studied. This list may not describe all possible interactions. Give your health care provider a list of all the medicines, herbs, non-prescription drugs, or dietary supplements you use. Also tell them if you smoke, drink alcohol, or use illegal drugs. Some items may interact with your medicine. What should I watch for while using this medication? Your condition will be monitored carefully while you are receiving this medication. This medication can cause serious allergic reactions. To reduce your risk, your care team may give you other medication to take before receiving this one. Be sure to follow the directions from your care team. This medication can affect the results of blood tests to match your blood type. These changes can last for up to 6 months after the final dose. Your care team will do blood tests to match your blood type before you start treatment. Tell all of your care team that you are being treated with this medication before receiving a blood transfusion. This medication can affect the results of some tests used to determine treatment response; extra tests may be  needed to evaluate response. Talk to your care team if you wish to become pregnant or think you are pregnant. This medication can cause serious birth defects if taken during pregnancy and for 3 months after the last dose. A reliable form of contraception is recommended while taking this medication and for 3 months after  the last dose. Talk to your care team about effective forms of contraception. Do not breast-feed while taking this medication. What side effects may I notice from receiving this medication? Side effects that you should report to your care team as soon as possible: Allergic reactions--skin rash, itching, hives, swelling of the face, lips, tongue, or throat Infection--fever, chills, cough, sore throat, wounds that don't heal, pain or trouble when passing urine, general feeling of discomfort or being unwell Infusion reactions--chest pain, shortness of breath or trouble breathing, feeling faint or lightheaded Unusual bruising or bleeding Side effects that usually do not require medical attention (report to your care team if they continue or are bothersome): Constipation Diarrhea Fatigue Nausea Pain, tingling, or numbness in the hands or feet Swelling of the ankles, hands, or feet This list may not describe all possible side effects. Call your doctor for medical advice about side effects. You may report side effects to FDA at 1-800-FDA-1088. Where should I keep my medication? This medication is given in a hospital or clinic. It will not be stored at home. NOTE: This sheet is a summary. It may not cover all possible information. If you have questions about this medicine, talk to your doctor, pharmacist, or health care provider.  2023 Elsevier/Gold Standard (2014-03-21 00:00:00)       To help prevent nausea and vomiting after your treatment, we encourage you to take your nausea medication as directed.  BELOW ARE SYMPTOMS THAT SHOULD BE REPORTED IMMEDIATELY: *FEVER GREATER THAN 100.4 F (38 C) OR HIGHER *CHILLS OR SWEATING *NAUSEA AND VOMITING THAT IS NOT CONTROLLED WITH YOUR NAUSEA MEDICATION *UNUSUAL SHORTNESS OF BREATH *UNUSUAL BRUISING OR BLEEDING *URINARY PROBLEMS (pain or burning when urinating, or frequent urination) *BOWEL PROBLEMS (unusual diarrhea, constipation, pain near the  anus) TENDERNESS IN MOUTH AND THROAT WITH OR WITHOUT PRESENCE OF ULCERS (sore throat, sores in mouth, or a toothache) UNUSUAL RASH, SWELLING OR PAIN  UNUSUAL VAGINAL DISCHARGE OR ITCHING   Items with * indicate a potential emergency and should be followed up as soon as possible or go to the Emergency Department if any problems should occur.  Please show the CHEMOTHERAPY ALERT CARD or IMMUNOTHERAPY ALERT CARD at check-in to the Emergency Department and triage nurse.  Should you have questions after your visit or need to cancel or reschedule your appointment, please contact [facility] [phone]  and follow the prompts.  Office hours are 8:00 a.m. to 4:30 p.m. Monday - Friday. Please note that voicemails left after 4:00 p.m. may not be returned until the following business day.  We are closed weekends and major holidays. You have access to a nurse at all times for urgent questions. Please call the main number to the clinic [phone] and follow the prompts.  For any non-urgent questions, you may also contact your provider using MyChart. We now offer e-Visits for anyone 37 and older to request care online for non-urgent symptoms. For details visit [email or web address].   Also download the MyChart app! Go to the app store, search "MyChart", open the app, select [facility], and log in with your MyChart username and password.  Masks are optional in the cancer  centers. If you would like for your care team to wear a mask while they are taking care of you, please let them know. You may have one support person who is at least 86 years old accompany you for your appointments.

## 2021-12-20 NOTE — Progress Notes (Signed)
Patient presents today for C1D1 Daratumumab IV. Labs pending. Consent obtained today. Son at the bedside. RBC Pehnotype and Type and Screen obtained today and sent to the lab prior to administration. Vital signs within parameters for today's treatment. Patient denies andy pain today and has no complaints.   Message received from A Beckie Salts / Dr. Delton Coombes proceed with treatment. Labs 12/13/2021.    Treatment given today per MD orders. Tolerated infusion without adverse affects. Vital signs stable. No complaints at this time. Discharged from clinic ambulatory in stable condition. Alert and oriented x 3. F/U with [facility] as scheduled.

## 2021-12-21 ENCOUNTER — Telehealth: Payer: Self-pay | Admitting: *Deleted

## 2021-12-21 NOTE — Telephone Encounter (Signed)
Called pt to follow-up after 24 hour chemotherapy administration. No answer, VM was left stated to call the clinic if needed.

## 2021-12-24 ENCOUNTER — Emergency Department (HOSPITAL_COMMUNITY)
Admission: EM | Admit: 2021-12-24 | Discharge: 2021-12-25 | Disposition: A | Discharge status: Home / Self Care | Payer: Medicare Other | Attending: Emergency Medicine | Admitting: Emergency Medicine

## 2021-12-24 ENCOUNTER — Emergency Department (HOSPITAL_COMMUNITY): Payer: Medicare Other

## 2021-12-24 ENCOUNTER — Encounter (HOSPITAL_COMMUNITY): Payer: Self-pay

## 2021-12-24 DIAGNOSIS — I110 Hypertensive heart disease with heart failure: Secondary | ICD-10-CM | POA: Diagnosis not present

## 2021-12-24 DIAGNOSIS — I509 Heart failure, unspecified: Secondary | ICD-10-CM | POA: Insufficient documentation

## 2021-12-24 DIAGNOSIS — R112 Nausea with vomiting, unspecified: Secondary | ICD-10-CM | POA: Diagnosis not present

## 2021-12-24 DIAGNOSIS — E039 Hypothyroidism, unspecified: Secondary | ICD-10-CM | POA: Diagnosis not present

## 2021-12-24 DIAGNOSIS — S20211A Contusion of right front wall of thorax, initial encounter: Secondary | ICD-10-CM | POA: Diagnosis not present

## 2021-12-24 DIAGNOSIS — I2510 Atherosclerotic heart disease of native coronary artery without angina pectoris: Secondary | ICD-10-CM | POA: Insufficient documentation

## 2021-12-24 DIAGNOSIS — S299XXA Unspecified injury of thorax, initial encounter: Secondary | ICD-10-CM | POA: Diagnosis present

## 2021-12-24 DIAGNOSIS — J45909 Unspecified asthma, uncomplicated: Secondary | ICD-10-CM | POA: Diagnosis not present

## 2021-12-24 DIAGNOSIS — Z85828 Personal history of other malignant neoplasm of skin: Secondary | ICD-10-CM | POA: Diagnosis not present

## 2021-12-24 DIAGNOSIS — X58XXXA Exposure to other specified factors, initial encounter: Secondary | ICD-10-CM | POA: Diagnosis not present

## 2021-12-24 LAB — COMPREHENSIVE METABOLIC PANEL
ALT: 20 U/L (ref 0–44)
AST: 19 U/L (ref 15–41)
Albumin: 3 g/dL — ABNORMAL LOW (ref 3.5–5.0)
Alkaline Phosphatase: 58 U/L (ref 38–126)
Anion gap: 6 (ref 5–15)
BUN: 22 mg/dL (ref 8–23)
CO2: 22 mmol/L (ref 22–32)
Calcium: 9 mg/dL (ref 8.9–10.3)
Chloride: 105 mmol/L (ref 98–111)
Creatinine, Ser: 0.98 mg/dL (ref 0.61–1.24)
GFR, Estimated: >60 mL/min (ref >60)
Glucose, Bld: 126 mg/dL — ABNORMAL HIGH (ref 70–99)
Potassium: 3.9 mmol/L (ref 3.5–5.1)
Sodium: 133 mmol/L — ABNORMAL LOW (ref 135–145)
Total Bilirubin: 0.8 mg/dL (ref 0.3–1.2)
Total Protein: 9.1 g/dL — ABNORMAL HIGH (ref 6.5–8.1)

## 2021-12-24 LAB — LIPASE, BLOOD: Lipase: 37 U/L (ref 11–51)

## 2021-12-24 MED ORDER — ONDANSETRON HCL 4 MG/2ML IJ SOLN
4.0000 mg | Freq: Once | INTRAMUSCULAR | Status: AC
  Administered 2021-12-24: 4 mg via INTRAVENOUS
  Filled 2021-12-24: qty 2

## 2021-12-24 MED ORDER — SODIUM CHLORIDE 0.9 % IV BOLUS
500.0000 mL | Freq: Once | INTRAVENOUS | Status: AC
  Administered 2021-12-24: 500 mL via INTRAVENOUS

## 2021-12-24 NOTE — ED Notes (Signed)
Edp still in room

## 2021-12-24 NOTE — ED Triage Notes (Signed)
Pt BIBA from home. Pt has right port placed. Pt has edema in right chest. Wife noticed this AM. Last chemo tx Thursday. Pt c/o nausea since 0900.

## 2021-12-24 NOTE — ED Provider Notes (Signed)
Emergency Department Provider Note   I have reviewed the triage vital signs and the nursing notes.   HISTORY  Chief Complaint Vascular Access Problem   HPI Dale [name] is a 88 y.o. male with past history reviewed below including multiple myeloma on chemotherapy with recent placement of a single-lumen PowerPort by IR (8/15) presents emergency department for evaluation of nausea and vomiting.  Per review of by triage note I see the complaint is chest wall swelling and concern for issues with the port.  Patient denies any particular swelling or discomfort since the port has been placed.  He has noticed some bruising to the chest wall which has been present since the procedure.  He is not experiencing shortness of breath or discomfort in the chest.  He has had nausea and vomiting without belly pain.  No fevers or chills.  He received his first infusion on 8/17 using the port and had no issues with this. No fever or chills.    Past Medical History:  Diagnosis Date   Allergic rhinitis    Anal fissure    Anginal pain (HCC)    Asthma    Cancer (HCC)    skin   Chest pain    CHF (congestive heart failure) ([place])    Coronary heart disease    s/p stenting. cath in 01/2012 noncritical occlusion   Dysrhythmia    1st degree heart block   GERD (gastroesophageal reflux disease)    Glaucoma    Hiatal hernia    Hyperlipidemia    Hypertension    Hypothyroidism    Idiopathic thrombocytopenic purpura ([place]) 2002   Macular degeneration    Nephrolithiasis    PUD (peptic ulcer disease)    remote   Sarcoidosis    pulmonary   Schatzki's ring     Review of Systems  Constitutional: No fever/chills Eyes: No visual changes. ENT: No sore throat. Cardiovascular: Denies chest pain. Respiratory: Denies shortness of breath. Gastrointestinal: No abdominal pain. Positive nausea and vomiting.  No diarrhea.  No constipation. Genitourinary: Negative for dysuria. Musculoskeletal: Negative for back  pain. Skin: Negative for rash. Neurological: Negative for headaches, focal weakness or numbness.   ____________________________________________   PHYSICAL EXAM:  VITAL SIGNS: ED Triage Vitals [12/24/21 1810]  Enc Vitals Group     BP (!) 164/77     Pulse Rate 91     Resp 18     Temp 97.7 F (36.5 C)     Temp Source Oral     SpO2 100 %   Constitutional: Alert and oriented. Well appearing and in no acute distress. Eyes: Conjunctivae are normal.  Head: Atraumatic. Nose: No congestion/rhinnorhea. Mouth/Throat: Mucous membranes are moist.   Neck: No stridor.   Cardiovascular: Normal rate, regular rhythm. Good peripheral circulation. Grossly normal heart sounds.   Respiratory: Normal respiratory effort.  No retractions. Lungs CTAB. Gastrointestinal: Soft and nontender. No distention.  Musculoskeletal: No lower extremity tenderness nor edema. No gross deformities of extremities. Neurologic:  Normal speech and language. No gross focal neurologic deficits are appreciated.  Skin:  Skin is warm and dry.  Bruising to the right anterior chest wall appears old.  No erythema or warmth to touch.  No fluctuance or clear abscess.    ____________________________________________   LABS (all labs ordered are listed, but only abnormal results are displayed)  Labs Reviewed  COMPREHENSIVE METABOLIC PANEL - Abnormal; Notable for the following components:      Result Value   Sodium 133 (*)  Glucose, Bld 126 (*)    Total Protein 9.1 (*)    Albumin 3.0 (*)    All other components within normal limits  CBC WITH DIFFERENTIAL/PLATELET - Abnormal; Notable for the following components:   RBC 2.22 (*)    Hemoglobin 7.9 (*)    HCT 24.0 (*)    MCV 108.1 (*)    MCH 35.6 (*)    Platelets 51 (*)    Abs Immature Granulocytes 0.08 (*)    All other components within normal limits  LIPASE, BLOOD   ____________________________________________  RADIOLOGY  DG Chest 2 View  Result Date:  12/24/2021 CLINICAL DATA:  Possible Port-A-Cath dislodgement. Right chest edema. EXAM: CHEST - 2 VIEW COMPARISON:  Chest radiograph 07/26/2021, fluoroscopic spot view 12/18/2021 from Port-A-Cath placement. FINDINGS: There may be a few millimeter separation of the right chest port at the level of the of and catheter, which appears increased from placement fluoroscopic spot view. There is no catheter discontinuity, the tip is in the lower SVC. The heart is normal in size, stable mediastinal contours. Stable calcified granuloma in the medial left lung base. No pneumothorax, focal airspace disease or large pleural effusion. IMPRESSION: The right chest port catheter tip is in the lower SVC. There may be a few millimeters separation of the catheter and port connection. Electronically Signed   By: Keith Rake M.D.   On: 12/24/2021 18:53    ____________________________________________   PROCEDURES  Procedure(s) performed:   Procedures   ____________________________________________   INITIAL IMPRESSION / ASSESSMENT AND PLAN / ED COURSE  Pertinent labs & imaging results that were available during my care of the patient were reviewed by me and considered in my medical decision making (see chart for details).   This patient is Presenting for Evaluation of nausea and vomiting, which does require a range of treatment options, and is a complaint that involves a high risk of morbidity and mortality.  The Differential Diagnoses includes viral illness, electrolyte disturbance, acute kidney injury, Port-A-Cath malfunction, etc.  Critical Interventions-    Medications  sodium chloride 0.9 % bolus 500 mL (500 mLs Intravenous New Bag/Given 12/24/21 2325)  ondansetron (ZOFRAN) injection 4 mg (4 mg Intravenous Given 12/24/21 2324)    Reassessment after intervention:     I did obtain Additional Historical Information from family at bedside.  I decided to review pertinent External Data, and in summary  Port placed on 7/62 without complication and used for infusion on 8/31 without complication.   Clinical Laboratory Tests Ordered, included no leukocytosis.  Baseline anemia with platelet count of 51 slightly lower than recent counts but has history of thrombocytopenia in the past.  No acute kidney injury.  Lipase normal.   Radiologic Tests Ordered, included CXR. I independently interpreted the images and agree with radiology interpretation.   Cardiac Monitor Tracing which shows NSR.   Medical Decision Making: Summary:  Patient presents to the emergency department for evaluation of nausea and vomiting.  The triage note describes concern for swelling to the chest wall which I do not particularly appreciate.  I do not see any clear sign of infection.  There is old appearing bruising which I would expect after placement of the port.  Abdomen is diffusely soft and nontender.  Patient is not subjectively having chest discomfort or shortness of breath.  I did review the chest x-ray and agree with radiology interpretation.  I do see a gap as noted but no acute symptoms or exam findings to prompt further imaging  of emergent consultation with IR. Will direct him to follow with Oncology and IR as an outpatient.   Reevaluation with update and discussion with patient and family. Tolerating PO here without nausea or vomiting currently. Will have him follow with IR and Oncology as an outpatient prior to using his port but do not see any clear exam findings to prompt further emergent evaluation.   Disposition: discharge  ____________________________________________  FINAL CLINICAL IMPRESSION(S) / ED DIAGNOSES  Final diagnoses:  Nausea and vomiting, unspecified vomiting type  Contusion of right chest wall, initial encounter     Note:  This document was prepared using Dragon voice recognition software and may include unintentional dictation errors.  Nanda Quinton, MD, [facility] Emergency Medicine    Cayley Pester,  [facility], MD 12/25/21 [phone]

## 2021-12-24 NOTE — ED Notes (Signed)
Please contact wife, [name] [phone] with updates

## 2021-12-25 ENCOUNTER — Other Ambulatory Visit (HOSPITAL_COMMUNITY): Payer: Self-pay | Admitting: Interventional Radiology

## 2021-12-25 DIAGNOSIS — S20211A Contusion of right front wall of thorax, initial encounter: Secondary | ICD-10-CM | POA: Diagnosis not present

## 2021-12-25 DIAGNOSIS — Z95828 Presence of other vascular implants and grafts: Secondary | ICD-10-CM

## 2021-12-25 LAB — CBC WITH DIFFERENTIAL/PLATELET
Abs Immature Granulocytes: 0.08 10*3/uL — ABNORMAL HIGH (ref 0.00–0.07)
Basophils Absolute: 0 10*3/uL (ref 0.0–0.1)
Basophils Relative: 0 %
Eosinophils Absolute: 0 10*3/uL (ref 0.0–0.5)
Eosinophils Relative: 1 %
HCT: 24 % — ABNORMAL LOW (ref 39.0–52.0)
Hemoglobin: 7.9 g/dL — ABNORMAL LOW (ref 13.0–17.0)
Immature Granulocytes: 2 %
Lymphocytes Relative: 15 %
Lymphs Abs: 0.7 10*3/uL (ref 0.7–4.0)
MCH: 35.6 pg — ABNORMAL HIGH (ref 26.0–34.0)
MCHC: 32.9 g/dL (ref 30.0–36.0)
MCV: 108.1 fL — ABNORMAL HIGH (ref 80.0–100.0)
Monocytes Absolute: 0.6 10*3/uL (ref 0.1–1.0)
Monocytes Relative: 13 %
Neutro Abs: 3.2 10*3/uL (ref 1.7–7.7)
Neutrophils Relative %: 69 %
Platelets: 51 10*3/uL — ABNORMAL LOW (ref 150–400)
RBC: 2.22 MIL/uL — ABNORMAL LOW (ref 4.22–5.81)
WBC: 4.6 10*3/uL (ref 4.0–10.5)
nRBC: 0 % (ref 0.0–0.2)

## 2021-12-25 NOTE — Discharge Instructions (Addendum)
You were seen in the emergency room today with nausea and vomiting.  Your lab work here is reassuring.  I would like for you to follow-up with your oncology and interventional radiology teams regarding your port access.  They can decide if further testing is indicated prior to using this for an infusion again.  Please take your home medications for nausea as prescribed.  Return with any new or suddenly worsening symptoms.

## 2021-12-25 NOTE — ED Notes (Signed)
Went over [employer] with patient and family. Wheeled out to lobby

## 2021-12-26 ENCOUNTER — Telehealth: Payer: Self-pay | Admitting: *Deleted

## 2021-12-26 NOTE — Telephone Encounter (Signed)
Call made to IR per myself regarding abnormal port placement by x ray.  Stated that they have been trying to reach patient to come in tomorrow at 8 am for assessment.  Reached out to wife, Caryl Asp and they will have him there in the morning.  Dr Delton Coombes made aware and appointments for tomorrow will be rescheduled according to findings.

## 2021-12-27 ENCOUNTER — Inpatient Hospital Stay: Payer: Medicare Other

## 2021-12-27 ENCOUNTER — Ambulatory Visit (HOSPITAL_COMMUNITY)
Admission: RE | Admit: 2021-12-27 | Discharge: 2021-12-27 | Disposition: A | Discharge status: Home / Self Care | Payer: Medicare Other | Source: Ambulatory Visit | Attending: Interventional Radiology | Admitting: Interventional Radiology

## 2021-12-27 DIAGNOSIS — Z452 Encounter for adjustment and management of vascular access device: Secondary | ICD-10-CM | POA: Insufficient documentation

## 2021-12-27 DIAGNOSIS — Z95828 Presence of other vascular implants and grafts: Secondary | ICD-10-CM

## 2021-12-27 HISTORY — PX: IR CV LINE INJECTION: IMG2294

## 2021-12-27 MED ORDER — IOHEXOL 300 MG/ML  SOLN
50.0000 mL | Freq: Once | INTRAMUSCULAR | Status: AC | PRN
  Administered 2021-12-27: 10 mL via INTRAVENOUS

## 2021-12-27 NOTE — Progress Notes (Addendum)
Pt was seen in IR today for port check. On imaging, port hub appears to be detached from port line. Large area of ecchymosis around port insertion site. Pt to return to IR for port removal and replacement.   **Current port is not to be used at this time.  See image under media.     Narda Rutherford, AGNP-BC 12/27/2021, 9:08 AM

## 2021-12-31 ENCOUNTER — Other Ambulatory Visit (HOSPITAL_COMMUNITY): Payer: Self-pay | Admitting: Interventional Radiology

## 2021-12-31 DIAGNOSIS — D649 Anemia, unspecified: Secondary | ICD-10-CM

## 2021-12-31 DIAGNOSIS — C9000 Multiple myeloma not having achieved remission: Secondary | ICD-10-CM

## 2021-12-31 DIAGNOSIS — L03115 Cellulitis of right lower limb: Secondary | ICD-10-CM | POA: Diagnosis not present

## 2021-12-31 DIAGNOSIS — M25571 Pain in right ankle and joints of right foot: Secondary | ICD-10-CM | POA: Diagnosis not present

## 2021-12-31 DIAGNOSIS — S93401A Sprain of unspecified ligament of right ankle, initial encounter: Secondary | ICD-10-CM | POA: Diagnosis not present

## 2022-01-01 LAB — PRETREATMENT RBC PHENOTYPE

## 2022-01-02 ENCOUNTER — Encounter: Payer: Self-pay | Admitting: *Deleted

## 2022-01-02 ENCOUNTER — Other Ambulatory Visit: Payer: Self-pay | Admitting: Physician Assistant

## 2022-01-02 ENCOUNTER — Encounter: Payer: Self-pay | Admitting: Orthopaedic Surgery

## 2022-01-02 ENCOUNTER — Ambulatory Visit (INDEPENDENT_AMBULATORY_CARE_PROVIDER_SITE_OTHER): Payer: Medicare Other | Admitting: Orthopaedic Surgery

## 2022-01-02 DIAGNOSIS — I25119 Atherosclerotic heart disease of native coronary artery with unspecified angina pectoris: Secondary | ICD-10-CM | POA: Diagnosis not present

## 2022-01-02 DIAGNOSIS — L03115 Cellulitis of right lower limb: Secondary | ICD-10-CM

## 2022-01-02 DIAGNOSIS — R634 Abnormal weight loss: Secondary | ICD-10-CM

## 2022-01-02 DIAGNOSIS — C9000 Multiple myeloma not having achieved remission: Secondary | ICD-10-CM

## 2022-01-02 DIAGNOSIS — N2889 Other specified disorders of kidney and ureter: Secondary | ICD-10-CM

## 2022-01-02 NOTE — Progress Notes (Signed)
My leg is worse.  He hit his right lower leg and medial ankle last weekend.  He developed swelling and redness.  He went to [facility] on 12-31-21.  Dr. Alta Corning gave him Clindamycin 30-0 tid and Relafen 500 bid.  He has been on the antibiotics two days today.  He has redness and a wound of the mid medial tibia about 2 cm x 3 cm with redness extending about 5 cm x 5 cm.  He has marked swelling of the lower leg and foot with medial ecchymosis of the ankle.  NV intact.  He has no limp.  I have reviewed the X-rays from [facility].  I have independently reviewed and interpreted x-rays of this patient done at another site by another physician or qualified health professional.  Encounter Diagnosis  Name Primary?   Cellulitis of right lower leg Yes   He is to continue the antibiotics.  He is to go to the ER if the wound gets worse or redness extends.  I will see him Tuesday 01-08-22.  He is to get a new portacath tomorrow and I told him to let them see his leg.  They may want to postpone the procedure.  Call if any problem.  Precautions discussed.  Electronically Signed Sanjuana Kava, MD 8/30/20239:47 AM

## 2022-01-02 NOTE — Progress Notes (Signed)
Patient had appointment with Dr. Luna Glasgow and was diagnosed with cellulitis of leg and ankle, for which he will be taking antibiotics x 10 days.  Port replacement rescheduled to 9/12.  Per Dr. Delton Coombes, he would like for him to keep his appointments 9/7 for labs, with him and possible treatment depending on lab values.  Wife made aware.

## 2022-01-03 ENCOUNTER — Other Ambulatory Visit: Payer: Medicare Other

## 2022-01-03 ENCOUNTER — Inpatient Hospital Stay (HOSPITAL_COMMUNITY): Admission: RE | Admit: 2022-01-03 | Payer: Medicare Other | Source: Ambulatory Visit

## 2022-01-03 ENCOUNTER — Ambulatory Visit (HOSPITAL_COMMUNITY): Payer: Medicare Other

## 2022-01-03 ENCOUNTER — Ambulatory Visit: Payer: Medicare Other

## 2022-01-03 ENCOUNTER — Ambulatory Visit: Payer: Medicare Other | Admitting: Hematology

## 2022-01-05 ENCOUNTER — Other Ambulatory Visit: Payer: Self-pay

## 2022-01-05 ENCOUNTER — Encounter (HOSPITAL_COMMUNITY): Payer: Self-pay

## 2022-01-05 ENCOUNTER — Emergency Department (HOSPITAL_COMMUNITY)
Admission: EM | Admit: 2022-01-05 | Discharge: 2022-01-05 | Disposition: A | Discharge status: Home / Self Care | Payer: Medicare Other | Attending: Emergency Medicine | Admitting: Emergency Medicine

## 2022-01-05 DIAGNOSIS — Z4801 Encounter for change or removal of surgical wound dressing: Secondary | ICD-10-CM | POA: Diagnosis not present

## 2022-01-05 DIAGNOSIS — I2510 Atherosclerotic heart disease of native coronary artery without angina pectoris: Secondary | ICD-10-CM | POA: Diagnosis not present

## 2022-01-05 DIAGNOSIS — Z79899 Other long term (current) drug therapy: Secondary | ICD-10-CM | POA: Diagnosis not present

## 2022-01-05 DIAGNOSIS — Z8579 Personal history of other malignant neoplasms of lymphoid, hematopoietic and related tissues: Secondary | ICD-10-CM | POA: Diagnosis not present

## 2022-01-05 DIAGNOSIS — I509 Heart failure, unspecified: Secondary | ICD-10-CM | POA: Insufficient documentation

## 2022-01-05 DIAGNOSIS — E039 Hypothyroidism, unspecified: Secondary | ICD-10-CM | POA: Diagnosis not present

## 2022-01-05 DIAGNOSIS — Z4800 Encounter for change or removal of nonsurgical wound dressing: Secondary | ICD-10-CM | POA: Insufficient documentation

## 2022-01-05 DIAGNOSIS — Z7982 Long term (current) use of aspirin: Secondary | ICD-10-CM | POA: Diagnosis not present

## 2022-01-05 DIAGNOSIS — Z5189 Encounter for other specified aftercare: Secondary | ICD-10-CM

## 2022-01-05 NOTE — ED Provider Notes (Signed)
[facility] EMERGENCY DEPARTMENT Provider Note   CSN: [id number] Arrival date & time: 01/05/22  1641     History  Chief Complaint  Patient presents with   Wound Check    Dale [name] is a 88 y.o. male with a history of CAD, BPH, sarcoidosis, hypothyroidism, hyperlipidemia, CHF, multiple myeloma who presents to the emergency department requesting a wound check.  Patient states that he had developed a wound on his right leg that he thought was getting infected.  He went to urgent care on 8/28 and was given clindamycin.  He then went to the orthopedist Dr. Luna Glasgow on 8/30.  At that time the wound was visualized and x-rays were reviewed.  They recommended that he continue the antibiotics and go to the ER if the wound worsened.  He is to follow-up in his office on 9/5.  Patient comes today requesting a wound check and to have his wound redressed.  He states that the wound has not worsened in any way, and he is otherwise been feeling well.  Denies any fevers or chills.  States he has been trying to keep it clean at home.  States he only has a few days of the antibiotics left.   Wound Check       Home Medications Prior to Admission medications   Medication Sig Start Date End Date Taking? Authorizing Provider  acyclovir (ZOVIRAX) 400 MG tablet Take 1 tablet (400 mg total) by mouth 2 (two) times daily. 11/08/21   Derek Jack, MD  Ascorbic Acid (VITAMIN C PO) Take 500 mg by mouth every evening.    [provider]  aspirin EC 81 MG tablet Take 81 mg by mouth every Monday, Wednesday, and Friday. Swallow whole.    [provider]  atorvastatin (LIPITOR) 40 MG tablet TAKE 1 TABLET BY MOUTH IN  THE EVENING 09/10/21   Troy Sine, MD  Bortezomib (VELCADE IJ) Inject as directed once a week.    [provider]  brimonidine (ALPHAGAN) 0.2 % ophthalmic solution Place 1 drop into the left eye 2 (two) times daily.    [provider]  clindamycin (CLEOCIN) 300  MG capsule Take 300 mg by mouth 3 (three) times daily. 12/31/21   [provider]  dexamethasone (DECADRON) 2 MG tablet TAKE 5 TABLETS(10 MG) BY MOUTH 1 TIME A WEEK 11/08/21   Derek Jack, MD  dorzolamide-timolol (COSOPT) 22.3-6.8 MG/ML ophthalmic solution Place 1 drop into the left eye 2 (two) times daily.    [provider]  furosemide (LASIX) 20 MG tablet If you notice any swelling, you may take 40 mg (two tablets) for the day. Patient taking differently: Take 20 mg by mouth 2 (two) times daily. 03/28/21   Troy Sine, MD  isosorbide mononitrate (IMDUR) 60 MG 24 hr tablet TAKE 1 AND 1/2 TABLETS BY  MOUTH IN THE MORNING AND  1/2 TABLET AT NIGHT Patient taking differently: TAKE 60 mg BY  MOUTH IN THE MORNING AND  30 mg TABLET AT NIGHT 07/02/21   Troy Sine, MD  levothyroxine (SYNTHROID) 75 MCG tablet Take 75 mcg by mouth daily before breakfast. 07/16/21   [provider]  lidocaine-prilocaine (EMLA) cream Apply 1 Application topically as needed. 12/20/21   Derek Jack, MD  losartan (COZAAR) 100 MG tablet TAKE 1 TABLET BY MOUTH  DAILY 09/21/21   Troy Sine, MD  metoprolol tartrate (LOPRESSOR) 25 MG tablet TAKE 1 TABLET BY MOUTH IN  THE MORNING AND ONE-HALF  TABLET BY MOUTH IN THE  EVENING 07/13/21   Troy Sine, MD  Multiple Vitamins-Minerals (PRESERVISION/LUTEIN) CAPS Take 1 capsule by mouth 2 (two) times daily.    [provider]  nabumetone (RELAFEN) 500 MG tablet Take 500 mg by mouth 2 (two) times daily. 12/31/21   [provider]  nitroGLYCERIN (NITROSTAT) 0.4 MG SL tablet Place 1 tablet (0.4 mg total) under the tongue every 5 (five) minutes as needed. For chest pain 09/27/20   Troy Sine, MD  pantoprazole (PROTONIX) 40 MG tablet TAKE 1 TABLET BY MOUTH  DAILY BEFORE BREAKFAST 07/27/21   Rourk, Cristopher Estimable, MD  potassium chloride SA (KLOR-CON M) 20 MEQ tablet TAKE 1 TABLET BY MOUTH  DAILY 05/28/21   Troy Sine, MD   prochlorperazine (COMPAZINE) 10 MG tablet Take 1 tablet (10 mg total) by mouth every 6 (six) hours as needed for nausea or vomiting. 12/20/21   Derek Jack, MD  ROCKLATAN 0.02-0.005 % SOLN Place 1 drop into the left eye at bedtime. 12/21/19   [provider]  triamcinolone (KENALOG) 0.1 % cream Apply 1 application. topically 2 (two) times daily as needed (for irritation).    [provider]  vitamin E 200 UNIT capsule Take 200 Units by mouth every evening.    [provider]      Allergies    Azithromycin, Doxazosin, Atenolol, Hydrocodone, Levofloxacin, Morphine, Penicillins, and Sulfonamide derivatives    Review of Systems   Review of Systems  Skin:  Positive for wound.  All other systems reviewed and are negative.   Physical Exam Updated Vital Signs BP (!) 174/82 (BP Location: Right Arm)   Pulse 87   Temp 98.1 F (36.7 C) (Oral)   Resp 17   Ht _0  (1.727 m)   Wt 56.7 kg   SpO2 100%   BMI 19.01 kg/m  Physical Exam Vitals and nursing note reviewed.  Constitutional:      Appearance: Normal appearance.  HENT:     Head: Normocephalic and atraumatic.  Eyes:     Conjunctiva/sclera: Conjunctivae normal.  Pulmonary:     Effort: Pulmonary effort is normal. No respiratory distress.  Skin:    General: Skin is warm and dry.     Comments: Approximately 1.5 cm circular wound on the right medial anterior leg with some erythema.  Some distal edema.  No purulence.  Wound image below.  Neurological:     Mental Status: He is alert.  Psychiatric:        Mood and Affect: Mood normal.        Behavior: Behavior normal.       ED Results / Procedures / Treatments   Labs (all labs ordered are listed, but only abnormal results are displayed) Labs Reviewed - No data to display  EKG None  Radiology No results found.  Procedures Procedures    Medications Ordered in ED Medications - No data to display  ED Course/ Medical Decision Making/  A&P                           Medical Decision Making  Patient is a 88 year old male with a history of CAD, BPH, sarcoidosis, hypothyroidism, hyperlipidemia, CHF, multiple myeloma who presents to the emergency department requesting a wound check.  Currently on a course of clindamycin.  He is to follow-up with the orthopedist on 9/5 for an additional wound check.  On my exam there is an approximately  1.5 cm circular wound on the right lower leg as imaged above.  No significant cellulitic findings, no purulence.  Some distal edema.  Wound appropriately dressed.  Advised patient continue antibiotics and follow-up with the orthopedist.  He is to return to the ER if the wound worsens in any way.  Patient discharged in stable condition and all questions answered.        Final Clinical Impression(s) / ED Diagnoses Final diagnoses:  Visit for wound check    Rx / DC Orders ED Discharge Orders     None      Portions of this report may have been transcribed using voice recognition software. Every effort was made to ensure accuracy; however, inadvertent computerized transcription errors may be present.    Estill Cotta 01/05/22 2315    Noemi Chapel, MD 01/06/22 1504

## 2022-01-05 NOTE — Discharge Instructions (Addendum)
You were seen in the emergency department for a wound check.  Your wound appears to be healing well. I recommend finishing the antibiotics and keeping the area clean as best as you can.  Follow up with your primary doctor next week once you finish the antibiotics.

## 2022-01-05 NOTE — ED Triage Notes (Signed)
Pt to er, pt states that he has a wound on his R leg, states that it got infected and his doctor gave him an abx, pt states that he is here to have the dressing changed/have it looked at.

## 2022-01-08 ENCOUNTER — Encounter: Payer: Self-pay | Admitting: Orthopaedic Surgery

## 2022-01-08 ENCOUNTER — Ambulatory Visit (INDEPENDENT_AMBULATORY_CARE_PROVIDER_SITE_OTHER): Payer: Medicare Other | Admitting: Orthopaedic Surgery

## 2022-01-08 VITALS — BP 146/76 | HR 71 | Ht 68.0 in | Wt 125.0 lb

## 2022-01-08 DIAGNOSIS — I25119 Atherosclerotic heart disease of native coronary artery with unspecified angina pectoris: Secondary | ICD-10-CM

## 2022-01-08 DIAGNOSIS — L03115 Cellulitis of right lower limb: Secondary | ICD-10-CM

## 2022-01-08 NOTE — Progress Notes (Signed)
I am better.  He has been taking his antibiotics and is doing well.  He is changing his dressing daily.  The wound of the lower third of the medial tibia is much improved with no redness today and no discharge.  His edema is down also.  NV intact.  Encounter Diagnosis  Name Primary?   Cellulitis of right lower leg Yes   Continue the antibiotics.  Return in one week.  Call if any problem.  Precautions discussed.  Electronically Signed Sanjuana Kava, MD 9/5/20239:23 AM

## 2022-01-09 ENCOUNTER — Other Ambulatory Visit: Payer: Self-pay

## 2022-01-09 DIAGNOSIS — C9000 Multiple myeloma not having achieved remission: Secondary | ICD-10-CM

## 2022-01-09 DIAGNOSIS — D649 Anemia, unspecified: Secondary | ICD-10-CM

## 2022-01-09 NOTE — Progress Notes (Signed)
Na

## 2022-01-10 ENCOUNTER — Inpatient Hospital Stay (HOSPITAL_BASED_OUTPATIENT_CLINIC_OR_DEPARTMENT_OTHER): Payer: Medicare Other | Admitting: Hematology

## 2022-01-10 ENCOUNTER — Inpatient Hospital Stay: Payer: Medicare Other

## 2022-01-10 ENCOUNTER — Inpatient Hospital Stay: Payer: Medicare Other | Attending: Hematology

## 2022-01-10 VITALS — BP 144/61 | HR 76 | Temp 98.0°F | Resp 18

## 2022-01-10 DIAGNOSIS — D649 Anemia, unspecified: Secondary | ICD-10-CM

## 2022-01-10 DIAGNOSIS — Z5112 Encounter for antineoplastic immunotherapy: Secondary | ICD-10-CM | POA: Diagnosis not present

## 2022-01-10 DIAGNOSIS — C9000 Multiple myeloma not having achieved remission: Secondary | ICD-10-CM

## 2022-01-10 DIAGNOSIS — Z79899 Other long term (current) drug therapy: Secondary | ICD-10-CM | POA: Insufficient documentation

## 2022-01-10 DIAGNOSIS — D630 Anemia in neoplastic disease: Secondary | ICD-10-CM | POA: Insufficient documentation

## 2022-01-10 LAB — CBC WITH DIFFERENTIAL/PLATELET
Abs Immature Granulocytes: 0.19 10*3/uL — ABNORMAL HIGH (ref 0.00–0.07)
Basophils Absolute: 0 10*3/uL (ref 0.0–0.1)
Basophils Relative: 0 %
Eosinophils Absolute: 0 10*3/uL (ref 0.0–0.5)
Eosinophils Relative: 0 %
HCT: 20.5 % — ABNORMAL LOW (ref 39.0–52.0)
Hemoglobin: 6.7 g/dL — CL (ref 13.0–17.0)
Immature Granulocytes: 2 %
Lymphocytes Relative: 5 %
Lymphs Abs: 0.5 10*3/uL — ABNORMAL LOW (ref 0.7–4.0)
MCH: 36.4 pg — ABNORMAL HIGH (ref 26.0–34.0)
MCHC: 32.7 g/dL (ref 30.0–36.0)
MCV: 111.4 fL — ABNORMAL HIGH (ref 80.0–100.0)
Monocytes Absolute: 2.4 10*3/uL — ABNORMAL HIGH (ref 0.1–1.0)
Monocytes Relative: 24 %
Neutro Abs: 6.8 10*3/uL (ref 1.7–7.7)
Neutrophils Relative %: 69 %
Platelets: 111 10*3/uL — ABNORMAL LOW (ref 150–400)
RBC: 1.84 MIL/uL — ABNORMAL LOW (ref 4.22–5.81)
WBC: 9.8 10*3/uL (ref 4.0–10.5)
nRBC: 0.2 % (ref 0.0–0.2)

## 2022-01-10 LAB — COMPREHENSIVE METABOLIC PANEL
ALT: 15 U/L (ref 0–44)
AST: 15 U/L (ref 15–41)
Albumin: 2.8 g/dL — ABNORMAL LOW (ref 3.5–5.0)
Alkaline Phosphatase: 61 U/L (ref 38–126)
Anion gap: 5 (ref 5–15)
BUN: 31 mg/dL — ABNORMAL HIGH (ref 8–23)
CO2: 21 mmol/L — ABNORMAL LOW (ref 22–32)
Calcium: 8.9 mg/dL (ref 8.9–10.3)
Chloride: 106 mmol/L (ref 98–111)
Creatinine, Ser: 0.99 mg/dL (ref 0.61–1.24)
GFR, Estimated: >60 mL/min (ref >60)
Glucose, Bld: 145 mg/dL — ABNORMAL HIGH (ref 70–99)
Potassium: 4.2 mmol/L (ref 3.5–5.1)
Sodium: 132 mmol/L — ABNORMAL LOW (ref 135–145)
Total Bilirubin: 1.2 mg/dL (ref 0.3–1.2)
Total Protein: 9.4 g/dL — ABNORMAL HIGH (ref 6.5–8.1)

## 2022-01-10 LAB — SAMPLE TO BLOOD BANK

## 2022-01-10 LAB — MAGNESIUM: Magnesium: 2.1 mg/dL (ref 1.7–2.4)

## 2022-01-10 LAB — PREPARE RBC (CROSSMATCH)

## 2022-01-10 MED ORDER — MONTELUKAST SODIUM 10 MG PO TABS
10.0000 mg | ORAL_TABLET | Freq: Once | ORAL | Status: AC
  Administered 2022-01-10: 10 mg via ORAL
  Filled 2022-01-10: qty 1

## 2022-01-10 MED ORDER — DIPHENHYDRAMINE HCL 25 MG PO CAPS
25.0000 mg | ORAL_CAPSULE | Freq: Once | ORAL | Status: AC
  Administered 2022-01-10: 25 mg via ORAL
  Filled 2022-01-10: qty 1

## 2022-01-10 MED ORDER — SODIUM CHLORIDE 0.9% IV SOLUTION
250.0000 mL | Freq: Once | INTRAVENOUS | Status: AC
  Administered 2022-01-10: 250 mL via INTRAVENOUS

## 2022-01-10 MED ORDER — ACETAMINOPHEN 325 MG PO TABS
650.0000 mg | ORAL_TABLET | Freq: Once | ORAL | Status: AC
  Administered 2022-01-10: 650 mg via ORAL
  Filled 2022-01-10: qty 2

## 2022-01-10 MED ORDER — METHYLPREDNISOLONE SODIUM SUCC 125 MG IJ SOLR
100.0000 mg | Freq: Once | INTRAMUSCULAR | Status: AC
  Administered 2022-01-10: 100 mg via INTRAVENOUS
  Filled 2022-01-10: qty 2

## 2022-01-10 MED ORDER — BORTEZOMIB CHEMO SQ INJECTION 3.5 MG (2.5MG/ML)
1.0000 mg/m2 | Freq: Once | INTRAMUSCULAR | Status: AC
  Administered 2022-01-10: 1.75 mg via SUBCUTANEOUS
  Filled 2022-01-10: qty 0.7

## 2022-01-10 MED ORDER — SODIUM CHLORIDE 0.9 % IV SOLN
16.0000 mg/kg | Freq: Once | INTRAVENOUS | Status: AC
  Administered 2022-01-10: 900 mg via INTRAVENOUS
  Filled 2022-01-10: qty 40

## 2022-01-10 MED ORDER — SODIUM CHLORIDE 0.9 % IV SOLN
8.0000 mg | Freq: Once | INTRAVENOUS | Status: AC
  Administered 2022-01-10: 8 mg via INTRAVENOUS
  Filled 2022-01-10: qty 4

## 2022-01-10 MED ORDER — SODIUM CHLORIDE 0.9 % IV SOLN: Freq: Once | INTRAVENOUS | Status: AC

## 2022-01-10 NOTE — Progress Notes (Signed)
Patient has been examined by Dr. Delton Coombes, and vital signs and labs have been reviewed. ANC, Creatinine, LFTs, hemoglobin, and platelets are within treatment parameters per M.D. - pt may proceed with treatment.  Pt will also receive 1 unit PRBC for hgb 6.7. Primary RN and pharmacy notified.

## 2022-01-10 NOTE — Patient Instructions (Signed)
[facility]  Discharge Instructions: Thank you for choosing [facility] to provide your oncology and hematology care.  If you have a lab appointment with the [facility], please come in thru the Main Entrance and check in at the main information desk.  Wear comfortable clothing and clothing appropriate for easy access to any Portacath or PICC line.   We strive to give you quality time with your provider. You may need to reschedule your appointment if you arrive late (15 or more minutes).  Arriving late affects you and other patients whose appointments are after yours.  Also, if you miss three or more appointments without notifying the office, you may be dismissed from the clinic at the provider's discretion.      For prescription refill requests, have your pharmacy contact our office and allow 72 hours for refills to be completed.    Today you received the following chemotherapy and/or immunotherapy agents IV Daratumumab Velcade and 1UPRBC      To help prevent nausea and vomiting after your treatment, we encourage you to take your nausea medication as directed.  BELOW ARE SYMPTOMS THAT SHOULD BE REPORTED IMMEDIATELY: *FEVER GREATER THAN 100.4 F (38 C) OR HIGHER *CHILLS OR SWEATING *NAUSEA AND VOMITING THAT IS NOT CONTROLLED WITH YOUR NAUSEA MEDICATION *UNUSUAL SHORTNESS OF BREATH *UNUSUAL BRUISING OR BLEEDING *URINARY PROBLEMS (pain or burning when urinating, or frequent urination) *BOWEL PROBLEMS (unusual diarrhea, constipation, pain near the anus) TENDERNESS IN MOUTH AND THROAT WITH OR WITHOUT PRESENCE OF ULCERS (sore throat, sores in mouth, or a toothache) UNUSUAL RASH, SWELLING OR PAIN  UNUSUAL VAGINAL DISCHARGE OR ITCHING   Items with * indicate a potential emergency and should be followed up as soon as possible or go to the Emergency Department if any problems should occur.  Please show the CHEMOTHERAPY ALERT CARD or IMMUNOTHERAPY ALERT CARD at  check-in to the Emergency Department and triage nurse.  Should you have questions after your visit or need to cancel or reschedule your appointment, please contact [facility] [phone]  and follow the prompts.  Office hours are 8:00 a.m. to 4:30 p.m. Monday - Friday. Please note that voicemails left after 4:00 p.m. may not be returned until the following business day.  We are closed weekends and major holidays. You have access to a nurse at all times for urgent questions. Please call the main number to the clinic [phone] and follow the prompts.  For any non-urgent questions, you may also contact your provider using MyChart. We now offer e-Visits for anyone 72 and older to request care online for non-urgent symptoms. For details visit [email or web address].   Also download the MyChart app! Go to the app store, search "MyChart", open the app, select [facility], and log in with your MyChart username and password.  Masks are optional in the cancer centers. If you would like for your care team to wear a mask while they are taking care of you, please let them know. You may have one support person who is at least 86 years old accompany you for your appointments.

## 2022-01-10 NOTE — Patient Instructions (Addendum)
[place] at [facility] Discharge Instructions   You were seen and examined today by Dr. Delton Coombes.  He reviewed the results of your lab work which are normal/stable. Your hemoglobin is 6.7 today. We will arrange for you to have one unit of blood today.   We will proceed with your treatment today.   Return as scheduled.    Thank you for choosing [place] at [facility] to provide your oncology and hematology care.  To afford each patient quality time with our provider, please arrive at least 15 minutes before your scheduled appointment time.   If you have a lab appointment with the [facility] please come in thru the Main Entrance and check in at the main information desk.  You need to re-schedule your appointment should you arrive 10 or more minutes late.  We strive to give you quality time with our providers, and arriving late affects you and other patients whose appointments are after yours.  Also, if you no show three or more times for appointments you may be dismissed from the clinic at the providers discretion.     Again, thank you for choosing [facility].  Our hope is that these requests will decrease the amount of time that you wait before being seen by our physicians.       _____________________________________________________________  Should you have questions after your visit to [facility], please contact our office at [phone] and follow the prompts.  Our office hours are 8:00 a.m. and 4:30 p.m. Monday - Friday.  Please note that voicemails left after 4:00 p.m. may not be returned until the following business day.  We are closed weekends and major holidays.  You do have access to a nurse 24-7, just call the main number to the clinic [phone] and do not press any options, hold on the line and a nurse will answer the phone.    For prescription refill requests, have your pharmacy contact our  office and allow 72 hours.    Due to Covid, you will need to wear a mask upon entering the hospital. If you do not have a mask, a mask will be given to you at the Main Entrance upon arrival. For doctor visits, patients may have 1 support person age 31 or older with them. For treatment visits, patients can not have anyone with them due to social distancing guidelines and our immunocompromised population.

## 2022-01-10 NOTE — Progress Notes (Signed)
Patient presents today for Daratumumab IV and Velcade injection per providers order.  Vital signs and labs reviewed by the MD.  Message received from Anastasio Champion RN/Dr. Delton Coombes patient okay for treatment.  MD aware that the patient is on antibiotics.  Patient will receive 1UPRBC today also.    Peripheral IV started and blood return noted pre and post infusion.  Velcade administration without incident; injection site WNL; see MAR for injection details. Treatment given today per MD orders.  Stable duriing infusion without adverse affects.  Vital signs stable.  No complaints at this time.  Discharge from clinic ambulatory in stable condition.  Alert and oriented X 3.  Follow up with [facility] as scheduled.

## 2022-01-10 NOTE — Progress Notes (Signed)
CRITICAL VALUE ALERT Critical value received:  HGB 6.7 Date of notification:  01-10-2022 Time of notification: 08:57 am.  Critical value read back:  Yes.   Nurse who received alert:  Bpresnell RN MD notified time and response:  Katragadda. Standing orders. 09:02am.

## 2022-01-10 NOTE — Progress Notes (Signed)
[place], [place] [address]   CLINIC:  Medical Oncology/Hematology  PCP:  [facility], [place] / [place] [address] [phone]   REASON FOR VISIT:  Follow-up for multiple myeloma and anemia  PRIOR THERAPY: none  CURRENT THERAPY: Weekly Velcade and dexamethasone  BRIEF ONCOLOGIC HISTORY:  Oncology History  Multiple myeloma ([place])  01/15/2021 Initial Diagnosis   Multiple myeloma ([place])   06/25/2021 - 12/06/2021 Chemotherapy   Patient is on Treatment Plan : MYELOMA NON-TRANSPLANT CANDIDATES VRd weekly q21d     12/20/2021 -  Chemotherapy   Patient is on Treatment Plan : MYELOMA Daratumumab IV q28d       CANCER STAGING:  Cancer Staging  No matching staging information was found for the patient.  INTERVAL HISTORY:  Mr. WESSON STITH, a 88 y.o. male, returns for follow-up of multiple myeloma.  He complains of some dyspnea on exertion.  He is having diarrhea while he is on antibiotics.  He is taking clindamycin 3 times daily started on 12/31/2021.  He reports 1-2 loose stools per day since antibiotic started.  He had malfunction of the Port-A-Cath which was subsequently removed.  He will have port placed next Tuesday.  After last treatment with Darzalex, he had nausea for 3 to 4 days but denied any vomiting.  Weight has been stable.  REVIEW OF SYSTEMS:  Review of Systems  Respiratory:  Positive for shortness of breath.   Gastrointestinal:  Positive for diarrhea.  Musculoskeletal:  Positive for arthralgias (6/10 L shoulder + L hip).  All other systems reviewed and are negative.   PAST MEDICAL/SURGICAL HISTORY:  Past Medical History:  Diagnosis Date   Allergic rhinitis    Anal fissure    Anginal pain (HCC)    Asthma    Cancer (HCC)    skin   Chest pain    CHF (congestive heart failure) ([place])    Coronary heart disease    s/p stenting. cath in 01/2012 noncritical occlusion   Dysrhythmia    1st degree heart block    GERD (gastroesophageal reflux disease)    Glaucoma    Hiatal hernia    Hyperlipidemia    Hypertension    Hypothyroidism    Idiopathic thrombocytopenic purpura ([facility]) 2002   Macular degeneration    Nephrolithiasis    PUD (peptic ulcer disease)    remote   Sarcoidosis    pulmonary   Schatzki's ring    Past Surgical History:  Procedure Laterality Date   cardiac stents     COLONOSCOPY  10/30/2006   Normal rectum, sigmoid diverticula.Remainder of colonic mucosa appeared normal.   CORONARY ANGIOPLASTY WITH STENT PLACEMENT     about 10 years ago per pt (around 2007)   [place], URETEROSCOPY AND STENT PLACEMENT Left 06/16/2017   Procedure: CYSTOSCOPY WITH RETROGRADE PYELOGRAM, URETEROSCOPY,STONE EXTRACTION  AND STENT PLACEMENT;  Surgeon: Franchot Gallo, MD;  Location: [facility];  Service: Urology;  Laterality: Left;   ESOPHAGOGASTRODUODENOSCOPY  06/19/2004   Two esophageal rings and esophageal web as described above.  All of these were disrupted by passing 56-French Venia Minks dilator/ Candida esophagitis,which appears to be incidental given history of   antibiotic use, but nevertheless will be treated.   ESOPHAGOGASTRODUODENOSCOPY  10/30/2006   Distal tandem esophageal ring status post dilation disruption as  described above.  Otherwise normal esophagus/  Small hiatal hernia otherwise normal stomach, D1 and D2   ESOPHAGOGASTRODUODENOSCOPY N/A 03/22/2015   Dr.Rourk-  noncritical schatzki's ring and hiatal hernia-o/w normal EGD.    ESOPHAGOGASTRODUODENOSCOPY (EGD) WITH ESOPHAGEAL DILATION  03/04/2012   RMR- schatzki's ring, hiatal hernia, polypoid gastric mucosa, bx= minimally active gastritis.   HOLMIUM LASER APPLICATION Left 9/37/1696   Procedure: HOLMIUM LASER APPLICATION;  Surgeon: Franchot Gallo, MD;  Location: [facility];  Service: Urology;  Laterality: Left;   IR CV LINE INJECTION  12/27/2021   IR GENERIC HISTORICAL  03/06/2016   IR RADIOLOGIST EVAL & MGMT  03/06/2016 Aletta Edouard, MD [facility] INTER[facility] RAD   IR GENERIC HISTORICAL  06/18/2016   IR RADIOLOGIST EVAL & MGMT 06/18/2016 Aletta Edouard, MD [facility] INTER[facility] RAD   IR IMAGING GUIDED PORT INSERTION  12/18/2021   IR RADIOLOGIST EVAL & MGMT  10/01/2016   IR RADIOLOGIST EVAL & MGMT  10/15/2017   IR RADIOLOGIST EVAL & MGMT  12/24/2018   IR RADIOLOGIST EVAL & MGMT  01/04/2020   IR RADIOLOGIST EVAL & MGMT  06/20/2021   LEFT HEART CATH N/A 02/02/2012   Procedure: LEFT HEART CATH;  Surgeon: Lorretta Harp, MD;  Location: [facility] CATH LAB;  Service: Cardiovascular;  Laterality: N/A;   MEDIASTINOSCOPY     for dx sarcoid   RADIOLOGY WITH ANESTHESIA Left 05/17/2016   Procedure: left renal ablation;  Surgeon: Aletta Edouard, MD;  Location: [facility];  Service: Radiology;  Laterality: Left;    SOCIAL HISTORY:  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    Employer: RETIRED  Tobacco Use   Smoking status: Former    Packs/day: 0.10    Years: 2.00    Total pack years: 0.20    Types: Cigarettes, Cigars    Quit date: 05/06/1970    Years since quitting: 51.7   Smokeless tobacco: Never  Vaping Use   Vaping Use: Never used  Substance and Sexual Activity   Alcohol use: No    Alcohol/week: 0.0 standard drinks of alcohol   Drug use: No   Sexual activity: Never  Other Topics Concern   Not on file  Social History Narrative   Not on file   Social Determinants of Health   Financial Resource Strain: Not on file  Food Insecurity: No Food Insecurity (08/27/2021)   Hunger Vital Sign    Worried About Running Out of Food in the Last Year: Never true    Ran Out of Food in the Last Year: Never true  Transportation Needs: Not on file  Physical Activity: Not on file  Stress: Not on file   Social Connections: Not on file  Intimate Partner Violence: Not on file    FAMILY HISTORY:  Family History  Problem Relation Age of Onset   Heart disease Father        deceased age 60   Stroke Mother    Alzheimer's disease Mother    Heart attack Brother        deceased age 17   Cancer Other        niece   Colon cancer Neg Hx     CURRENT MEDICATIONS:  Current Outpatient Medications  Medication Sig Dispense Refill   acyclovir (ZOVIRAX) 400 MG tablet Take 1 tablet (400 mg total) by mouth 2 (two) times daily. 180 tablet 0  Ascorbic Acid (VITAMIN C PO) Take 500 mg by mouth every evening.     aspirin EC 81 MG tablet Take 81 mg by mouth every Monday, Wednesday, and Friday. Swallow whole.     atorvastatin (LIPITOR) 40 MG tablet TAKE 1 TABLET BY MOUTH IN  THE EVENING 90 tablet 3   Bortezomib (VELCADE IJ) Inject as directed once a week.     brimonidine (ALPHAGAN) 0.2 % ophthalmic solution Place 1 drop into the left eye 2 (two) times daily.     clindamycin (CLEOCIN) 300 MG capsule Take 300 mg by mouth 3 (three) times daily.     dexamethasone (DECADRON) 2 MG tablet TAKE 5 TABLETS(10 MG) BY MOUTH 1 TIME A WEEK 60 tablet 0   dorzolamide-timolol (COSOPT) 22.3-6.8 MG/ML ophthalmic solution Place 1 drop into the left eye 2 (two) times daily.     furosemide (LASIX) 20 MG tablet If you notice any swelling, you may take 40 mg (two tablets) for the day. (Patient taking differently: Take 20 mg by mouth 2 (two) times daily.) 90 tablet 3   isosorbide mononitrate (IMDUR) 60 MG 24 hr tablet TAKE 1 AND 1/2 TABLETS BY  MOUTH IN THE MORNING AND  1/2 TABLET AT NIGHT (Patient taking differently: TAKE 60 mg BY  MOUTH IN THE MORNING AND  30 mg TABLET AT NIGHT) 180 tablet 3   levothyroxine (SYNTHROID) 75 MCG tablet Take 75 mcg by mouth daily before breakfast.     lidocaine-prilocaine (EMLA) cream Apply 1 Application topically as needed. 30 g 1   losartan (COZAAR) 100 MG tablet TAKE 1 TABLET BY MOUTH  DAILY 90  tablet 1   metoprolol tartrate (LOPRESSOR) 25 MG tablet TAKE 1 TABLET BY MOUTH IN  THE MORNING AND ONE-HALF  TABLET BY MOUTH IN THE  EVENING 135 tablet 3   Multiple Vitamins-Minerals (PRESERVISION/LUTEIN) CAPS Take 1 capsule by mouth 2 (two) times daily.     nabumetone (RELAFEN) 500 MG tablet Take 500 mg by mouth 2 (two) times daily.     nitroGLYCERIN (NITROSTAT) 0.4 MG SL tablet Place 1 tablet (0.4 mg total) under the tongue every 5 (five) minutes as needed. For chest pain 25 tablet 2   pantoprazole (PROTONIX) 40 MG tablet TAKE 1 TABLET BY MOUTH  DAILY BEFORE BREAKFAST 90 tablet 1   potassium chloride SA (KLOR-CON M) 20 MEQ tablet TAKE 1 TABLET BY MOUTH  DAILY 90 tablet 3   prochlorperazine (COMPAZINE) 10 MG tablet Take 1 tablet (10 mg total) by mouth every 6 (six) hours as needed for nausea or vomiting. 30 tablet 3   ROCKLATAN 0.02-0.005 % SOLN Place 1 drop into the left eye at bedtime.     triamcinolone (KENALOG) 0.1 % cream Apply 1 application. topically 2 (two) times daily as needed (for irritation).     vitamin E 200 UNIT capsule Take 200 Units by mouth every evening.     No current facility-administered medications for this visit.   Facility-Administered Medications Ordered in Other Visits  Medication Dose Route Frequency Provider Last Rate Last Admin   0.9 %  sodium chloride infusion (Manually program via Guardrails IV Fluids)  250 mL Intravenous Once Derek Jack, MD       acetaminophen (TYLENOL) 325 MG tablet            acetaminophen (TYLENOL) tablet 650 mg  650 mg Oral Once Derek Jack, MD       diphenhydrAMINE (BENADRYL) 25 mg capsule  diphenhydrAMINE (BENADRYL) capsule 25 mg  25 mg Oral Once Derek Jack, MD       montelukast (SINGULAIR) 10 MG tablet             ALLERGIES:  Allergies  Allergen Reactions   Azithromycin Other (See Comments)    Sore mouth and fever blisters around mouth, sores in nose area as well   Doxazosin Shortness Of  Breath   Atenolol Other (See Comments)    UNKNOWN REACTION   Hydrocodone Nausea And Vomiting   Levofloxacin Other (See Comments)    Caused stomach problems.   Morphine Other (See Comments)    "made me crazy"   Penicillins Nausea And Vomiting and Other (See Comments)    Has patient had a PCN reaction causing immediate rash, facial/tongue/throat swelling, SOB or lightheadedness with hypotension: No Has patient had a PCN reaction causing severe rash involving mucus membranes or skin necrosis: No Has patient had a PCN reaction that required hospitalization No Has patient had a PCN reaction occurring within the last 10 years: No If all of the above answers are "NO", then may proceed with Cephalosporin use.    Sulfonamide Derivatives Nausea And Vomiting    PHYSICAL EXAM:  Performance status (ECOG): 0 - Asymptomatic  Vitals:   01/10/22 0932  BP: (!) 141/54  Pulse: 72  Resp: 18  Temp: (!) 97.4 F (36.3 C)  SpO2: 100%   Wt Readings from Last 3 Encounters:  01/10/22 124 lb 8 oz (56.5 kg)  01/08/22 125 lb (56.7 kg)  01/05/22 125 lb (56.7 kg)   Physical Exam Vitals reviewed.  Constitutional:      Appearance: Normal appearance.  Cardiovascular:     Rate and Rhythm: Normal rate and regular rhythm.     Pulses: Normal pulses.     Heart sounds: Normal heart sounds.  Pulmonary:     Effort: Pulmonary effort is normal.     Breath sounds: Normal breath sounds.  Neurological:     General: No focal deficit present.     Mental Status: He is alert and oriented to person, place, and time.  Psychiatric:        Mood and Affect: Mood normal.        Behavior: Behavior normal.    LABORATORY DATA:  I have reviewed the labs as listed.     Latest Ref Rng & Units 01/10/2022    8:41 AM 12/24/2021   11:30 PM 12/20/2021    7:58 AM  CBC  WBC 4.0 - 10.5 K/uL 9.8  4.6  3.1   Hemoglobin 13.0 - 17.0 g/dL 6.7  7.9  7.3   Hematocrit 39.0 - 52.0 % 20.5  24.0  22.2   Platelets 150 - 400 K/uL 111  51   108       Latest Ref Rng & Units 01/10/2022    8:41 AM 12/24/2021   11:30 PM 12/20/2021    7:58 AM  CMP  Glucose 70 - 99 mg/dL 145  126  113   BUN 8 - 23 mg/dL 31  22  28    Creatinine 0.61 - 1.24 mg/dL 0.99  0.98  1.03   Sodium 135 - 145 mmol/L 132  133  134   Potassium 3.5 - 5.1 mmol/L 4.2  3.9  3.8   Chloride 98 - 111 mmol/L 106  105  105   CO2 22 - 32 mmol/L 21  22  24    Calcium 8.9 - 10.3 mg/dL 8.9  9.0  8.8  Total Protein 6.5 - 8.1 g/dL 9.4  9.1  8.8   Total Bilirubin 0.3 - 1.2 mg/dL 1.2  0.8  0.7   Alkaline Phos 38 - 126 U/L 61  58  60   AST 15 - 41 U/L 15  19  20    ALT 0 - 44 U/L 15  20  18      DIAGNOSTIC IMAGING:  I have independently reviewed the scans and discussed with the patient. IR CV Line Injection  Result Date: 12/28/2021 INDICATION: Recent port placement. Malfunctioning with question of dislodgement. EXAM: FLUOROSCOPIC GUIDED PORT A CATHETER CHECK COMPARISON:  Chest XR, 12/24/2021.  IR fluoroscopy, 12/18/2021. MEDICATIONS: None. CONTRAST:  10 mL Omnipaque 300 FLUOROSCOPY TIME:  Fluoroscopic dose; 2 mGy COMPLICATIONS: None immediate. TECHNIQUE: The procedure, risks, benefits, and alternatives were explained to the patient and/or patient's representative and informed written consent was obtained. A timeout was performed prior to the initiation of the procedure. The patient's chest port a catheter was accessed by the IR RN. The patient was placed supine on the fluoroscopy table. A preprocedural spot fluoroscopic image was obtained of the chest in existing port a catheter. Contrast was injected via the Port a catheter and images were reviewed. The Port a catheter was flushed with a heparin dwell and de accessed. A dressing was placed. The patient tolerated the procedure well without immediate postprocedural complication. FINDINGS: *RIGHT internal jugular vein approach port a catheter with tip projected over the expected location of the superior cavoatrial junction. *Contrast  injection demonstrated with patent catheter, without evidence of catheter kink or fracture. *The port hub, however, appears to have partially dis-attached since placement on 12/18/2021. See key image. *Patient with significant bruising and ecchymosis within the RIGHT chest subcutaneous tissues. No discrete contrast extravasation was appreciated. IMPRESSION: 1. Stable positioning of RIGHT internal jugular vein approach port catheter. 2. The port hub appears to have partially dis-attached since recent placement. See key image. Significant bruising and ecchymosis of the RIGHT chest, no discrete extravasation on port injection. PLAN: DO NOT USE RIGHT chest port catheter. The patient will be scheduled for and return to Vascular Interventional Radiology (VIR) for port removal and replacement. Michaelle Birks, MD [facility] Radiology Electronically Signed   By: Michaelle Birks M.D.   On: 12/28/2021 10:04   DG Chest 2 View  Result Date: 12/24/2021 CLINICAL DATA:  Possible Port-A-Cath dislodgement. Right chest edema. EXAM: CHEST - 2 VIEW COMPARISON:  Chest radiograph 07/26/2021, fluoroscopic spot view 12/18/2021 from Port-A-Cath placement. FINDINGS: There may be a few millimeter separation of the right chest port at the level of the of and catheter, which appears increased from placement fluoroscopic spot view. There is no catheter discontinuity, the tip is in the lower SVC. The heart is normal in size, stable mediastinal contours. Stable calcified granuloma in the medial left lung base. No pneumothorax, focal airspace disease or large pleural effusion. IMPRESSION: The right chest port catheter tip is in the lower SVC. There may be a few millimeters separation of the catheter and port connection. Electronically Signed   By: Keith Rake M.D.   On: 12/24/2021 18:53   IR IMAGING GUIDED PORT INSERTION  Result Date: 12/18/2021 INDICATION: 88 year old male with history of  multiple myeloma requiring central venous access for chemotherapy administration. EXAM: IMPLANTED PORT A CATH PLACEMENT WITH ULTRASOUND AND FLUOROSCOPIC GUIDANCE COMPARISON:  None Available. MEDICATIONS: None. ANESTHESIA/SEDATION: Moderate (conscious) sedation was employed during this procedure. A total of Versed 0.5 mg and  Fentanyl 25 mcg was administered intravenously. Moderate Sedation Time: 14 minutes. The patient's level of consciousness and vital signs were monitored continuously by radiology nursing throughout the procedure under my direct supervision. CONTRAST:  None FLUOROSCOPY TIME:  0.1 minutes, 0 mGy COMPLICATIONS: None immediate. PROCEDURE: The procedure, risks, benefits, and alternatives were explained to the patient. Questions regarding the procedure were encouraged and answered. The patient understands and consents to the procedure. The right neck and chest were prepped with chlorhexidine in a sterile fashion, and a sterile drape was applied covering the operative field. Maximum barrier sterile technique with sterile gowns and gloves were used for the procedure. A timeout was performed prior to the initiation of the procedure. Ultrasound was used to examine the jugular vein which was compressible and free of internal echoes. A skin marker was used to demarcate the planned venotomy and port pocket incision sites. Local anesthesia was provided to these sites and the subcutaneous tunnel track with 1% lidocaine with 1:100,000 epinephrine. A small incision was created at the jugular access site and blunt dissection was performed of the subcutaneous tissues. Under ultrasound guidance, the jugular vein was accessed with a 21 ga micropuncture needle and an 0.018" wire was inserted to the superior vena cava. Real-time ultrasound guidance was utilized for vascular access including the acquisition of a permanent ultrasound image documenting patency of the accessed vessel. A 5 Fr micopuncture set was then used,  through which a 0.035" Rosen wire was passed under fluoroscopic guidance into the inferior vena cava. An 8 Fr dilator was then placed over the wire. A subcutaneous port pocket was then created along the upper chest wall utilizing a combination of sharp and blunt dissection. The pocket was irrigated with sterile saline, packed with gauze, and observed for hemorrhage. A single lumen clear view power injectable port was chosen for placement. The 8 Fr catheter was tunneled from the port pocket site to the venotomy incision. The port was placed in the pocket. The external catheter was trimmed to appropriate length. The dilator was exchanged for an 8 Fr peel-away sheath under fluoroscopic guidance. The catheter was then placed through the sheath and the sheath was removed. Final catheter positioning was confirmed and documented with a fluoroscopic spot radiograph. The port was accessed with a Huber needle, aspirated, and flushed with heparinized saline. The deep dermal layer of the port pocket incision was closed with interrupted 3-0 Vicryl suture. The skin was opposed with a running subcuticular 4-0 Monocryl suture. Dermabond was then placed over the port pocket and neck incisions. The patient tolerated the procedure well without immediate post procedural complication. FINDINGS: After catheter placement, the tip lies within the superior cavoatrial junction. The catheter aspirates and flushes normally and is ready for immediate use. IMPRESSION: Successful placement of a power injectable Port-A-Cath via the right internal jugular vein. The catheter is ready for immediate use. Ruthann Cancer, MD [facility] Radiology Electronically Signed   By: Ruthann Cancer M.D.   On: 12/18/2021 12:53     ASSESSMENT:  1.  IgG lambda plasma cell myeloma: - Work-up for macrocytic anemia on 12/01/2020 showed M spike of 2.9 g.  Immunofixation IgG lambda. - Lambda light chains elevated at 284.   Light chain ratio was 0.04.  LDH was 163.  Creatinine was 0.9 and calcium 8.9. - Bone marrow biopsy on 12/18/2020-hypercellular marrow for age with 48% atypical plasma cells with lambda light chain restriction. - Chromosome analysis was normal. 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 - Revlimid 5 mg, 2 weeks on/1 week off started on 01/20/2021.  Dexamethasone weekly 10 mg added on 02/07/2021.  Revlimid is discontinued around 06/15/2021 due to poor tolerance and ineffectiveness at low-dose.  Thrombocytopenia and anemia. - Velcade weekly on days 1, 8, 15 every 21 days along with dexamethasone 10 mg weekly started on 06/25/2021.  2.  Macrocytic anemia: - CBC on 12/01/2020 with hemoglobin 8.5 and MCV of 117. - Denies any bleeding per rectum or melena.  3.  Social/family history: - Lives at home with his wife.  He does all ADLs and IADLs.  He even does yard work. - He worked at [employer] for 35 years.  Denies any chemical exposure.  Non-smoker. - No family history of malignancies.   PLAN:  1.  IgG lambda plasma cell myeloma, high risk: - He tolerated first dose of intravenous Darzalex reasonably well.  He had nausea for 3 to 4 days which improved.  He did not have any vomiting.  Weight has been stable. - He is having 1-2 loose stools per day since antibiotic clindamycin was started for a skin infection on the leg. - Reviewed labs today which showed normal creatinine and LFTs.  Hemoglobin is 6.7.  Platelet count improved to 111.  He will receive 1 unit of PRBC. - We will proceed with second dose of Darzalex today.  I will add Zofran to the premeds.  He will continue weekly Darzalex.  RTC 3 weeks with myeloma labs.   2.  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  Hemoglobin today 6.7.  Will order 1 unit PRBC.  3.  Thromboprophylaxis: - Continue aspirin 81 mg  daily.  4.  Right ankle swelling: - Can continue Lasix as needed.  5.  ID prophylaxis: - Continue acyclovir twice daily.   Orders placed this encounter:  No orders of the defined types were placed in this encounter.     Derek Jack, MD [place] [phone]   I, Thana Ates, am acting as a scribe for Dr. Derek Jack.  I, Derek Jack MD, have reviewed the above documentation for accuracy and completeness, and I agree with the above.

## 2022-01-11 ENCOUNTER — Encounter: Payer: Self-pay | Admitting: Hematology

## 2022-01-11 LAB — TYPE AND SCREEN
ABO/RH(D): O POS
Antibody Screen: NEGATIVE
Unit division: 0

## 2022-01-11 LAB — BPAM RBC
Blood Product Expiration Date: 202310062359
ISSUE DATE / TIME: 202309071058
Unit Type and Rh: 5100

## 2022-01-13 ENCOUNTER — Other Ambulatory Visit: Payer: Self-pay | Admitting: Radiology

## 2022-01-15 ENCOUNTER — Ambulatory Visit (HOSPITAL_COMMUNITY): Payer: Medicare Other

## 2022-01-15 ENCOUNTER — Ambulatory Visit (HOSPITAL_COMMUNITY)
Admission: RE | Admit: 2022-01-15 | Discharge: 2022-01-15 | Disposition: A | Discharge status: Home / Self Care | Payer: Medicare Other | Source: Ambulatory Visit | Attending: Interventional Radiology | Admitting: Interventional Radiology

## 2022-01-15 ENCOUNTER — Encounter (HOSPITAL_COMMUNITY): Payer: Self-pay

## 2022-01-15 DIAGNOSIS — R634 Abnormal weight loss: Secondary | ICD-10-CM

## 2022-01-15 DIAGNOSIS — T82598A Other mechanical complication of other cardiac and vascular devices and implants, initial encounter: Secondary | ICD-10-CM | POA: Diagnosis not present

## 2022-01-15 DIAGNOSIS — N2889 Other specified disorders of kidney and ureter: Secondary | ICD-10-CM

## 2022-01-15 DIAGNOSIS — Z452 Encounter for adjustment and management of vascular access device: Secondary | ICD-10-CM | POA: Diagnosis not present

## 2022-01-15 DIAGNOSIS — D649 Anemia, unspecified: Secondary | ICD-10-CM | POA: Diagnosis not present

## 2022-01-15 DIAGNOSIS — C9000 Multiple myeloma not having achieved remission: Secondary | ICD-10-CM | POA: Insufficient documentation

## 2022-01-15 HISTORY — DX: Dyspnea, unspecified: R06.00

## 2022-01-15 HISTORY — PX: IR REMOVAL TUN ACCESS W/ PORT W/O FL MOD SED: IMG2290

## 2022-01-15 HISTORY — PX: IR IMAGING GUIDED PORT INSERTION: IMG5740

## 2022-01-15 MED ORDER — HEPARIN SOD (PORK) LOCK FLUSH 100 UNIT/ML IV SOLN
INTRAVENOUS | Status: AC
  Administered 2022-01-15: 500 [IU] via INTRAVENOUS
  Filled 2022-01-15: qty 5

## 2022-01-15 MED ORDER — MIDAZOLAM HCL 2 MG/2ML IJ SOLN
INTRAMUSCULAR | Status: AC
  Filled 2022-01-15: qty 2

## 2022-01-15 MED ORDER — FENTANYL CITRATE (PF) 100 MCG/2ML IJ SOLN
INTRAMUSCULAR | Status: AC
  Filled 2022-01-15: qty 2

## 2022-01-15 MED ORDER — LIDOCAINE-EPINEPHRINE 1 %-1:100000 IJ SOLN
INTRAMUSCULAR | Status: AC
  Filled 2022-01-15: qty 2

## 2022-01-15 MED ORDER — CEFAZOLIN SODIUM-DEXTROSE 2-4 GM/100ML-% IV SOLN
INTRAVENOUS | Status: AC
  Filled 2022-01-15: qty 100

## 2022-01-15 MED ORDER — MIDAZOLAM HCL 2 MG/2ML IJ SOLN
INTRAMUSCULAR | Status: AC | PRN
  Administered 2022-01-15 (×4): .5 mg via INTRAVENOUS

## 2022-01-15 MED ORDER — FENTANYL CITRATE (PF) 100 MCG/2ML IJ SOLN
INTRAMUSCULAR | Status: AC | PRN
  Administered 2022-01-15 (×4): 25 ug via INTRAVENOUS

## 2022-01-15 MED ORDER — CEFAZOLIN SODIUM-DEXTROSE 2-4 GM/100ML-% IV SOLN
2.0000 g | INTRAVENOUS | Status: AC
  Administered 2022-01-15: 2 g via INTRAVENOUS

## 2022-01-15 MED ORDER — SODIUM CHLORIDE 0.9 % IV SOLN: INTRAVENOUS | Status: DC

## 2022-01-15 NOTE — Procedures (Signed)
Vascular and Interventional Radiology Procedure Note  Patient: Dale [name] DOB: 1929/03/08 Medical Record Number: [id number] Note Date/Time: 01/15/22 2:08 PM   Performing Physician: Michaelle Birks, MD Assistant(s): None  Diagnosis:  Myeloma  Procedure:  LEFT CHEST PORT PLACEMENT RIGHT CHEST PORT REMOVAL  Anesthesia: Conscious Sedation Complications: None Estimated Blood Loss: Minimal  Findings:  Successful left-sided port placement, with the tip of the catheter at the superior cavo-atrial junction. Successful removal of a right-sided venous port. Primary incision closure. Dermabond at skin.  Plan: Catheter ready for use.  See detailed procedure note with images in PACS. The patient tolerated the procedure well without incident or complication and was returned to Recovery in stable condition.    Michaelle Birks, MD [facility] Radiology   Pager. [place]

## 2022-01-15 NOTE — H&P (Signed)
Referring Physician(s): Katragadda,S  Supervising Physician: Michaelle Birks  Patient Status:  [facility]  Chief Complaint: Malfunctioning port a cath, myeloma   Subjective: Patient known to IR service from biopsy and cryoablation of left renal mass in 2019, bone marrow biopsy in 2022, Port-A-Cath placement on 12/18/2021 and Port-A-Cath injection on 12/28/2021. He has a history of multiple myeloma.  The Port-A-Cath injection on 8/25 revealed that port hub appears to have partially disattached with significant bruising in region.  There was no discrete extravasation on port injection.  He presents today for Port-A-Cath removal and new port placement.  He denies fever, headache, chest pain, dyspnea, cough, back pain, nausea, vomiting.  He is HOH and does complain of mild stomach upset with some occasional diarrhea of dark stool. Additional med hx as below. He has completed course of antbx for cellulitis RLE.   Past Medical History:  Diagnosis Date   Allergic rhinitis    Anal fissure    Anginal pain (HCC)    Asthma    Cancer (HCC)    skin   Chest pain    CHF (congestive heart failure) ([place])    Coronary heart disease    s/p stenting. cath in 01/2012 noncritical occlusion   Dyspnea    Dysrhythmia    1st degree heart block   GERD (gastroesophageal reflux disease)    Glaucoma    Hiatal hernia    Hyperlipidemia    Hypertension    Hypothyroidism    Idiopathic thrombocytopenic purpura ([place]) 2002   Macular degeneration    Nephrolithiasis    PUD (peptic ulcer disease)    remote   Sarcoidosis    pulmonary   Schatzki's ring    Past Surgical History:  Procedure Laterality Date   cardiac stents     COLONOSCOPY  10/30/2006   Normal rectum, sigmoid diverticula.Remainder of colonic mucosa appeared normal.   CORONARY ANGIOPLASTY WITH STENT PLACEMENT     about 10 years ago per pt (around 2007)   [place], URETEROSCOPY AND STENT PLACEMENT Left 06/16/2017    Procedure: CYSTOSCOPY WITH RETROGRADE PYELOGRAM, URETEROSCOPY,STONE EXTRACTION  AND STENT PLACEMENT;  Surgeon: Franchot Gallo, MD;  Location: [facility];  Service: Urology;  Laterality: Left;   ESOPHAGOGASTRODUODENOSCOPY  06/19/2004   Two esophageal rings and esophageal web as described above.  All of these were disrupted by passing 56-French Venia Minks dilator/ Candida esophagitis,which appears to be incidental given history of   antibiotic use, but nevertheless will be treated.   ESOPHAGOGASTRODUODENOSCOPY  10/30/2006   Distal tandem esophageal ring status post dilation disruption as  described above.  Otherwise normal esophagus/  Small hiatal hernia otherwise normal stomach, D1 and D2   ESOPHAGOGASTRODUODENOSCOPY N/A 03/22/2015   Dr.Rourk- noncritical schatzki's ring and hiatal hernia-o/w normal EGD.    ESOPHAGOGASTRODUODENOSCOPY (EGD) WITH ESOPHAGEAL DILATION  03/04/2012   RMR- schatzki's ring, hiatal hernia, polypoid gastric mucosa, bx= minimally active gastritis.   HOLMIUM LASER APPLICATION Left 9/81/1914   Procedure: HOLMIUM LASER APPLICATION;  Surgeon: Franchot Gallo, MD;  Location: [facility];  Service: Urology;  Laterality: Left;   IR CV LINE INJECTION  12/27/2021   IR GENERIC HISTORICAL  03/06/2016   IR RADIOLOGIST EVAL & MGMT 03/06/2016 Aletta Edouard, MD [facility] INTERV RAD   IR GENERIC HISTORICAL  06/18/2016   IR RADIOLOGIST EVAL & MGMT 06/18/2016 Aletta Edouard, MD [facility] INTER[facility] RAD   IR IMAGING GUIDED PORT INSERTION  12/18/2021   IR RADIOLOGIST EVAL & MGMT  10/01/2016   IR  RADIOLOGIST EVAL & MGMT  10/15/2017   IR RADIOLOGIST EVAL & MGMT  12/24/2018   IR RADIOLOGIST EVAL & MGMT  01/04/2020   IR RADIOLOGIST EVAL & MGMT  06/20/2021   LEFT HEART CATH N/A 02/02/2012   Procedure: LEFT HEART CATH;  Surgeon: Lorretta Harp, MD;  Location: [facility] CATH LAB;  Service: Cardiovascular;  Laterality: N/A;   MEDIASTINOSCOPY     for dx sarcoid   RADIOLOGY WITH ANESTHESIA Left 05/17/2016   Procedure: left renal  ablation;  Surgeon: Aletta Edouard, MD;  Location: [facility];  Service: Radiology;  Laterality: Left;      Allergies: Azithromycin, Doxazosin, Atenolol, Hydrocodone, Levofloxacin, Morphine, Penicillins, and Sulfonamide derivatives  Medications: Prior to Admission medications   Medication Sig Start Date End Date Taking? Authorizing Provider  acyclovir (ZOVIRAX) 400 MG tablet Take 1 tablet (400 mg total) by mouth 2 (two) times daily. 11/08/21  Yes Derek Jack, MD  Ascorbic Acid (VITAMIN C PO) Take 500 mg by mouth every evening.   Yes [provider]  aspirin EC 81 MG tablet Take 81 mg by mouth every Monday, Wednesday, and Friday. Swallow whole.   Yes [provider]  atorvastatin (LIPITOR) 40 MG tablet TAKE 1 TABLET BY MOUTH IN  THE EVENING 09/10/21  Yes Troy Sine, MD  brimonidine (ALPHAGAN) 0.2 % ophthalmic solution Place 1 drop into the left eye 2 (two) times daily.   Yes [provider]  dorzolamide-timolol (COSOPT) 22.3-6.8 MG/ML ophthalmic solution Place 1 drop into the left eye 2 (two) times daily.   Yes [provider]  furosemide (LASIX) 20 MG tablet If you notice any swelling, you may take 40 mg (two tablets) for the day. Patient taking differently: Take 20 mg by mouth 2 (two) times daily. 03/28/21  Yes Troy Sine, MD  isosorbide mononitrate (IMDUR) 60 MG 24 hr tablet TAKE 1 AND 1/2 TABLETS BY  MOUTH IN THE MORNING AND  1/2 TABLET AT NIGHT Patient taking differently: TAKE 60 mg BY  MOUTH IN THE MORNING AND  30 mg TABLET AT NIGHT 07/02/21  Yes Troy Sine, MD  levothyroxine (SYNTHROID) 75 MCG tablet Take 75 mcg by mouth daily before breakfast. 07/16/21  Yes [provider]  losartan (COZAAR) 100 MG tablet TAKE 1 TABLET BY MOUTH  DAILY 09/21/21  Yes Troy Sine, MD  metoprolol tartrate (LOPRESSOR) 25 MG tablet TAKE 1 TABLET BY MOUTH IN  THE MORNING AND ONE-HALF  TABLET BY MOUTH IN THE  EVENING 07/13/21  Yes Troy Sine, MD   Multiple Vitamins-Minerals (PRESERVISION/LUTEIN) CAPS Take 1 capsule by mouth 2 (two) times daily.   Yes [provider]  pantoprazole (PROTONIX) 40 MG tablet TAKE 1 TABLET BY MOUTH  DAILY BEFORE BREAKFAST 07/27/21  Yes Rourk, Cristopher Estimable, MD  potassium chloride SA (KLOR-CON M) 20 MEQ tablet TAKE 1 TABLET BY MOUTH  DAILY 05/28/21  Yes Troy Sine, MD  ROCKLATAN 0.02-0.005 % SOLN Place 1 drop into the left eye at bedtime. 12/21/19  Yes [provider]  vitamin E 200 UNIT capsule Take 200 Units by mouth every evening.   Yes [provider]  Bortezomib (VELCADE IJ) Inject as directed once a week.    [provider]  clindamycin (CLEOCIN) 300 MG capsule Take 300 mg by mouth 3 (three) times daily. 12/31/21   [provider]  dexamethasone (DECADRON) 2 MG tablet TAKE 5 TABLETS(10 MG) BY MOUTH 1 TIME A WEEK 11/08/21   Derek Jack,  MD  lidocaine-prilocaine (EMLA) cream Apply 1 Application topically as needed. 12/20/21   Derek Jack, MD  nabumetone (RELAFEN) 500 MG tablet Take 500 mg by mouth 2 (two) times daily. 12/31/21   [provider]  nitroGLYCERIN (NITROSTAT) 0.4 MG SL tablet Place 1 tablet (0.4 mg total) under the tongue every 5 (five) minutes as needed. For chest pain 09/27/20   Troy Sine, MD  prochlorperazine (COMPAZINE) 10 MG tablet Take 1 tablet (10 mg total) by mouth every 6 (six) hours as needed for nausea or vomiting. 12/20/21   Derek Jack, MD  triamcinolone (KENALOG) 0.1 % cream Apply 1 application. topically 2 (two) times daily as needed (for irritation).    [provider]     Vital Signs: BP (!) 155/63   Pulse 71   Temp 98.1 F (36.7 C) (Oral)   Resp 16   SpO2 99%   Physical Exam awake/alert; chest- CTA bilat; rt chest port in place with mild ecchymosis of surrounding skin; heart- RRR; abd- soft,+BS,NT; rt lower leg wound covered with bandage; no sig LE edema  Imaging: No results  found.  Labs:  CBC: Recent Labs    12/13/21 1051 12/20/21 0758 12/24/21 2330 01/10/22 0841  WBC 2.9* 3.1* 4.6 9.8  HGB 6.5* 7.3* 7.9* 6.7*  HCT 19.7* 22.2* 24.0* 20.5*  PLT 108* 108* 51* 111*    COAGS: Recent Labs    07/27/21 1604  INR 1.4*    BMP: Recent Labs    12/13/21 1051 12/20/21 0758 12/24/21 2330 01/10/22 0841  NA 137 134* 133* 132*  K 3.9 3.8 3.9 4.2  CL 109 105 105 106  CO2 _0 21*  GLUCOSE 103* 113* 126* 145*  BUN 27* 28* 22 31*  CALCIUM 8.8* 8.8* 9.0 8.9  CREATININE 0.96 1.03 0.98 0.99  GFRNONAA >60 >60 >60 >60    LIVER FUNCTION TESTS: Recent Labs    12/13/21 1051 12/20/21 0758 12/24/21 2330 01/10/22 0841  BILITOT 0.8 0.7 0.8 1.2  AST _1 ALT _2 ALKPHOS 58 60 58 61  PROT 8.8* 8.8* 9.1* 9.4*  ALBUMIN 2.9* 2.9* 3.0* 2.8*    Assessment and Plan: Patient known to IR service from biopsy and cryoablation of left renal mass in 2019, bone marrow biopsy in 2022, Port-A-Cath placement on 12/18/2021 and Port-A-Cath injection on 12/28/2021. He has a history of multiple myeloma.  The Port-A-Cath injection on 8/25 revealed that port hub appears to have partially disattached with significant bruising in region.  There was no discrete extravasation on port injection.  He presents today for Port-A-Cath removal and new port placement. Risks and benefits of image guided port-a-catheter placement was discussed with the patient/son including, but not limited to bleeding, infection, pneumothorax, or fibrin sheath development and need for additional procedures.  All of the patient's questions were answered, patient is agreeable to proceed. Consent signed and in chart.    Electronically Signed: D. Rowe Robert, PA-C 01/15/2022, 11:13 AM   I spent a total of 20 minutes at the the patient's bedside AND on the patient's hospital floor or unit, greater than 50% of which was counseling/coordinating care for Port-A-Cath removal and new  Port-A-Cath placement

## 2022-01-16 ENCOUNTER — Other Ambulatory Visit: Payer: Self-pay

## 2022-01-16 ENCOUNTER — Encounter (HOSPITAL_COMMUNITY): Payer: Self-pay | Admitting: *Deleted

## 2022-01-16 ENCOUNTER — Ambulatory Visit: Payer: Medicare Other | Admitting: Orthopaedic Surgery

## 2022-01-16 ENCOUNTER — Observation Stay (HOSPITAL_COMMUNITY)
Admission: EM | Admit: 2022-01-16 | Discharge: 2022-01-17 | Disposition: A | Discharge status: Home Health | Payer: Medicare Other | Attending: Emergency Medicine | Admitting: Emergency Medicine

## 2022-01-16 ENCOUNTER — Emergency Department (HOSPITAL_COMMUNITY): Payer: Medicare Other

## 2022-01-16 DIAGNOSIS — D6481 Anemia due to antineoplastic chemotherapy: Secondary | ICD-10-CM | POA: Diagnosis not present

## 2022-01-16 DIAGNOSIS — I110 Hypertensive heart disease with heart failure: Secondary | ICD-10-CM | POA: Diagnosis not present

## 2022-01-16 DIAGNOSIS — E870 Hyperosmolality and hypernatremia: Secondary | ICD-10-CM | POA: Insufficient documentation

## 2022-01-16 DIAGNOSIS — I10 Essential (primary) hypertension: Secondary | ICD-10-CM | POA: Diagnosis not present

## 2022-01-16 DIAGNOSIS — C9000 Multiple myeloma not having achieved remission: Secondary | ICD-10-CM | POA: Insufficient documentation

## 2022-01-16 DIAGNOSIS — Z87891 Personal history of nicotine dependence: Secondary | ICD-10-CM | POA: Diagnosis not present

## 2022-01-16 DIAGNOSIS — I2510 Atherosclerotic heart disease of native coronary artery without angina pectoris: Secondary | ICD-10-CM | POA: Diagnosis not present

## 2022-01-16 DIAGNOSIS — R627 Adult failure to thrive: Secondary | ICD-10-CM | POA: Diagnosis not present

## 2022-01-16 DIAGNOSIS — S61411A Laceration without foreign body of right hand, initial encounter: Secondary | ICD-10-CM | POA: Insufficient documentation

## 2022-01-16 DIAGNOSIS — S61411S Laceration without foreign body of right hand, sequela: Secondary | ICD-10-CM

## 2022-01-16 DIAGNOSIS — Z79899 Other long term (current) drug therapy: Secondary | ICD-10-CM | POA: Diagnosis not present

## 2022-01-16 DIAGNOSIS — W1839XA Other fall on same level, initial encounter: Secondary | ICD-10-CM | POA: Diagnosis not present

## 2022-01-16 DIAGNOSIS — E039 Hypothyroidism, unspecified: Secondary | ICD-10-CM | POA: Insufficient documentation

## 2022-01-16 DIAGNOSIS — S51812A Laceration without foreign body of left forearm, initial encounter: Secondary | ICD-10-CM | POA: Insufficient documentation

## 2022-01-16 DIAGNOSIS — E46 Unspecified protein-calorie malnutrition: Secondary | ICD-10-CM | POA: Insufficient documentation

## 2022-01-16 DIAGNOSIS — S299XXA Unspecified injury of thorax, initial encounter: Secondary | ICD-10-CM | POA: Diagnosis not present

## 2022-01-16 DIAGNOSIS — D61818 Other pancytopenia: Secondary | ICD-10-CM | POA: Insufficient documentation

## 2022-01-16 DIAGNOSIS — I509 Heart failure, unspecified: Secondary | ICD-10-CM | POA: Diagnosis not present

## 2022-01-16 DIAGNOSIS — K5730 Diverticulosis of large intestine without perforation or abscess without bleeding: Secondary | ICD-10-CM | POA: Diagnosis not present

## 2022-01-16 DIAGNOSIS — Z85828 Personal history of other malignant neoplasm of skin: Secondary | ICD-10-CM | POA: Insufficient documentation

## 2022-01-16 DIAGNOSIS — S51811A Laceration without foreign body of right forearm, initial encounter: Secondary | ICD-10-CM | POA: Insufficient documentation

## 2022-01-16 DIAGNOSIS — J45909 Unspecified asthma, uncomplicated: Secondary | ICD-10-CM | POA: Insufficient documentation

## 2022-01-16 DIAGNOSIS — J432 Centrilobular emphysema: Secondary | ICD-10-CM | POA: Diagnosis not present

## 2022-01-16 DIAGNOSIS — S59919A Unspecified injury of unspecified forearm, initial encounter: Secondary | ICD-10-CM | POA: Diagnosis not present

## 2022-01-16 DIAGNOSIS — S51811S Laceration without foreign body of right forearm, sequela: Secondary | ICD-10-CM | POA: Diagnosis not present

## 2022-01-16 DIAGNOSIS — T451X5A Adverse effect of antineoplastic and immunosuppressive drugs, initial encounter: Secondary | ICD-10-CM | POA: Diagnosis not present

## 2022-01-16 DIAGNOSIS — W19XXXA Unspecified fall, initial encounter: Secondary | ICD-10-CM | POA: Diagnosis not present

## 2022-01-16 DIAGNOSIS — J439 Emphysema, unspecified: Secondary | ICD-10-CM | POA: Diagnosis not present

## 2022-01-16 DIAGNOSIS — Z7982 Long term (current) use of aspirin: Secondary | ICD-10-CM | POA: Insufficient documentation

## 2022-01-16 DIAGNOSIS — S51812S Laceration without foreign body of left forearm, sequela: Secondary | ICD-10-CM | POA: Diagnosis not present

## 2022-01-16 DIAGNOSIS — Y92009 Unspecified place in unspecified non-institutional (private) residence as the place of occurrence of the external cause: Secondary | ICD-10-CM | POA: Diagnosis not present

## 2022-01-16 DIAGNOSIS — Z955 Presence of coronary angioplasty implant and graft: Secondary | ICD-10-CM | POA: Insufficient documentation

## 2022-01-16 DIAGNOSIS — G319 Degenerative disease of nervous system, unspecified: Secondary | ICD-10-CM | POA: Diagnosis not present

## 2022-01-16 DIAGNOSIS — S199XXA Unspecified injury of neck, initial encounter: Secondary | ICD-10-CM | POA: Diagnosis not present

## 2022-01-16 DIAGNOSIS — D696 Thrombocytopenia, unspecified: Secondary | ICD-10-CM | POA: Insufficient documentation

## 2022-01-16 DIAGNOSIS — R55 Syncope and collapse: Principal | ICD-10-CM | POA: Insufficient documentation

## 2022-01-16 DIAGNOSIS — D638 Anemia in other chronic diseases classified elsewhere: Secondary | ICD-10-CM | POA: Diagnosis present

## 2022-01-16 DIAGNOSIS — J431 Panlobular emphysema: Secondary | ICD-10-CM | POA: Diagnosis not present

## 2022-01-16 DIAGNOSIS — N400 Enlarged prostate without lower urinary tract symptoms: Secondary | ICD-10-CM | POA: Diagnosis present

## 2022-01-16 DIAGNOSIS — K219 Gastro-esophageal reflux disease without esophagitis: Secondary | ICD-10-CM | POA: Diagnosis present

## 2022-01-16 DIAGNOSIS — J90 Pleural effusion, not elsewhere classified: Secondary | ICD-10-CM | POA: Diagnosis not present

## 2022-01-16 DIAGNOSIS — S0990XA Unspecified injury of head, initial encounter: Secondary | ICD-10-CM | POA: Diagnosis not present

## 2022-01-16 DIAGNOSIS — N200 Calculus of kidney: Secondary | ICD-10-CM | POA: Diagnosis not present

## 2022-01-16 DIAGNOSIS — S3991XA Unspecified injury of abdomen, initial encounter: Secondary | ICD-10-CM | POA: Diagnosis not present

## 2022-01-16 DIAGNOSIS — K7689 Other specified diseases of liver: Secondary | ICD-10-CM | POA: Diagnosis not present

## 2022-01-16 DIAGNOSIS — R5381 Other malaise: Secondary | ICD-10-CM | POA: Diagnosis not present

## 2022-01-16 LAB — URINALYSIS, ROUTINE W REFLEX MICROSCOPIC
Bilirubin Urine: NEGATIVE
Glucose, UA: NEGATIVE mg/dL
Hgb urine dipstick: NEGATIVE
Ketones, ur: 5 mg/dL — AB
Leukocytes,Ua: NEGATIVE
Nitrite: NEGATIVE
Protein, ur: 30 mg/dL — AB
Specific Gravity, Urine: 1.017 (ref 1.005–1.030)
pH: 5 (ref 5.0–8.0)

## 2022-01-16 LAB — CBC WITH DIFFERENTIAL/PLATELET
Abs Immature Granulocytes: 0 10*3/uL (ref 0.00–0.07)
Band Neutrophils: 4 %
Basophils Absolute: 0 10*3/uL (ref 0.0–0.1)
Basophils Relative: 1 %
Blasts: 2 %
Eosinophils Absolute: 0 10*3/uL (ref 0.0–0.5)
Eosinophils Relative: 0 %
HCT: 23.7 % — ABNORMAL LOW (ref 39.0–52.0)
Hemoglobin: 7.6 g/dL — ABNORMAL LOW (ref 13.0–17.0)
Lymphocytes Relative: 28 %
Lymphs Abs: 0.4 10*3/uL — ABNORMAL LOW (ref 0.7–4.0)
MCH: 33.6 pg (ref 26.0–34.0)
MCHC: 32.1 g/dL (ref 30.0–36.0)
MCV: 104.9 fL — ABNORMAL HIGH (ref 80.0–100.0)
Metamyelocytes Relative: 2 %
Monocytes Absolute: 0.2 10*3/uL (ref 0.1–1.0)
Monocytes Relative: 14 %
Neutro Abs: 0.8 10*3/uL — ABNORMAL LOW (ref 1.7–7.7)
Neutrophils Relative %: 48 %
Platelets: 29 10*3/uL — CL (ref 150–400)
Promyelocytes Relative: 1 %
RBC: 2.26 MIL/uL — ABNORMAL LOW (ref 4.22–5.81)
WBC: 1.5 10*3/uL — ABNORMAL LOW (ref 4.0–10.5)
nRBC: 0 % (ref 0.0–0.2)

## 2022-01-16 LAB — BASIC METABOLIC PANEL
Anion gap: 7 (ref 5–15)
BUN: 21 mg/dL (ref 8–23)
CO2: 22 mmol/L (ref 22–32)
Calcium: 8.6 mg/dL — ABNORMAL LOW (ref 8.9–10.3)
Chloride: 100 mmol/L (ref 98–111)
Creatinine, Ser: 0.94 mg/dL (ref 0.61–1.24)
GFR, Estimated: >60 mL/min (ref >60)
Glucose, Bld: 104 mg/dL — ABNORMAL HIGH (ref 70–99)
Potassium: 3.9 mmol/L (ref 3.5–5.1)
Sodium: 129 mmol/L — ABNORMAL LOW (ref 135–145)

## 2022-01-16 LAB — MAGNESIUM: Magnesium: 2 mg/dL (ref 1.7–2.4)

## 2022-01-16 LAB — PREPARE RBC (CROSSMATCH)

## 2022-01-16 MED ORDER — SODIUM CHLORIDE 0.9% IV SOLUTION: Freq: Once | INTRAVENOUS | Status: DC

## 2022-01-16 MED ORDER — VITAMIN B-12 1000 MCG PO TABS
1000.0000 ug | ORAL_TABLET | Freq: Every day | ORAL | Status: DC
  Administered 2022-01-16 – 2022-01-17 (×2): 1000 ug via ORAL
  Filled 2022-01-16 (×2): qty 1

## 2022-01-16 MED ORDER — ACETAMINOPHEN 650 MG RE SUPP: 650.0000 mg | Freq: Four times a day (QID) | RECTAL | Status: DC | PRN

## 2022-01-16 MED ORDER — BRIMONIDINE TARTRATE 0.2 % OP SOLN
1.0000 [drp] | Freq: Two times a day (BID) | OPHTHALMIC | Status: DC
  Administered 2022-01-17 (×2): 1 [drp] via OPHTHALMIC
  Filled 2022-01-16: qty 5

## 2022-01-16 MED ORDER — SODIUM CHLORIDE 0.9 % IV SOLN: INTRAVENOUS | Status: DC

## 2022-01-16 MED ORDER — PANTOPRAZOLE SODIUM 40 MG PO TBEC
40.0000 mg | DELAYED_RELEASE_TABLET | Freq: Every day | ORAL | Status: DC
  Administered 2022-01-17: 40 mg via ORAL
  Filled 2022-01-16: qty 1

## 2022-01-16 MED ORDER — IOHEXOL 300 MG/ML  SOLN
100.0000 mL | Freq: Once | INTRAMUSCULAR | Status: AC | PRN
  Administered 2022-01-16: 80 mL via INTRAVENOUS

## 2022-01-16 MED ORDER — ACYCLOVIR 800 MG PO TABS
400.0000 mg | ORAL_TABLET | Freq: Two times a day (BID) | ORAL | Status: DC
  Administered 2022-01-17 (×2): 400 mg via ORAL
  Filled 2022-01-16 (×2): qty 1

## 2022-01-16 MED ORDER — LEVOTHYROXINE SODIUM 75 MCG PO TABS
75.0000 ug | ORAL_TABLET | Freq: Every day | ORAL | Status: DC
  Administered 2022-01-17: 75 ug via ORAL
  Filled 2022-01-16: qty 1

## 2022-01-16 MED ORDER — DORZOLAMIDE HCL-TIMOLOL MAL 2-0.5 % OP SOLN
1.0000 [drp] | Freq: Two times a day (BID) | OPHTHALMIC | Status: DC
  Administered 2022-01-17 (×2): 1 [drp] via OPHTHALMIC
  Filled 2022-01-16: qty 10

## 2022-01-16 MED ORDER — NETARSUDIL-LATANOPROST 0.02-0.005 % OP SOLN: 1.0000 [drp] | Freq: Every day | OPHTHALMIC | Status: DC

## 2022-01-16 MED ORDER — SODIUM CHLORIDE 0.9 % IV SOLN: 10.0000 mL/h | Freq: Once | INTRAVENOUS | Status: DC

## 2022-01-16 MED ORDER — ENSURE ENLIVE PO LIQD
237.0000 mL | Freq: Two times a day (BID) | ORAL | Status: DC
  Filled 2022-01-16 (×2): qty 237

## 2022-01-16 MED ORDER — ACETAMINOPHEN 325 MG PO TABS: 650.0000 mg | ORAL_TABLET | Freq: Four times a day (QID) | ORAL | Status: DC | PRN

## 2022-01-16 MED ORDER — SODIUM CHLORIDE 0.9 % IV BOLUS
500.0000 mL | Freq: Once | INTRAVENOUS | Status: AC
  Administered 2022-01-16: 500 mL via INTRAVENOUS

## 2022-01-16 NOTE — ED Notes (Addendum)
Patient resting in bed, family at bedside.

## 2022-01-16 NOTE — ED Notes (Signed)
Pt had a new left chest port placed yesterday. Per EDP hold off on using port until oncology consult tomorrow. Dale Corona Edd Fabian

## 2022-01-16 NOTE — ED Provider Notes (Signed)
[facility] EMERGENCY DEPARTMENT Provider Note   CSN: [id number] Arrival date & time: 01/16/22  1027     History  Chief Complaint  Patient presents with   Dale [name]    Dale [name] is a 88 y.o. male.   Fall Pertinent negatives include no chest pain, no abdominal pain, no headaches and no shortness of breath.        Dale [name] is a 88 y.o. male with past medical history of coronary artery disease, GERD, CHF, ITP, hypertension, and multiple myeloma who presents to the Emergency Department for evaluation of syncope and fall.  Patient who lives at home with spouse.  States he fell this morning after passing out.  Patient's history of the events leading up to the fall are limited.  I contacted his spouse who provides most of the history.  Spouse states that he had a Port-A-Cath placed yesterday at [facility] and was there most of the day without food or drink.  He was given peanut butter and crackers before discharged and began vomiting on the way home.  He went home and went to bed.  Spouse states he woke this morning and was standing in the kitchen when he "passed out" she states that he was out for approximately 1 to 2 minutes no witnessed seizure activity.  Spouse states that he was alert and oriented upon waking.  Wife states that he struck his hands on the bar as he fell.  Has skin tears to both forearms and left hand.  Unable to control bleeding with direct pressure.  On arrival, patient denies pain, dizziness, nausea or persistent vomiting, shortness of breath and visual changes.  He is currently receiving Darzalex and Velcade infusions.Last treatment 01/10/22  Home Medications Prior to Admission medications   Medication Sig Start Date End Date Taking? Authorizing Provider  triamcinolone (KENALOG) 0.1 % cream Apply 1 application. topically 2 (two) times daily as needed (for irritation).   Yes [provider]  acyclovir (ZOVIRAX) 400 MG tablet Take 1 tablet (400 mg  total) by mouth 2 (two) times daily. 11/08/21   Derek Jack, MD  Ascorbic Acid (VITAMIN C PO) Take 500 mg by mouth every evening.    [provider]  aspirin EC 81 MG tablet Take 81 mg by mouth every Monday, Wednesday, and Friday. Swallow whole.    [provider]  atorvastatin (LIPITOR) 40 MG tablet TAKE 1 TABLET BY MOUTH IN  THE EVENING 09/10/21   Troy Sine, MD  Bortezomib (VELCADE IJ) Inject as directed once a week.    [provider]  brimonidine (ALPHAGAN) 0.2 % ophthalmic solution Place 1 drop into the left eye 2 (two) times daily.    [provider]  clindamycin (CLEOCIN) 300 MG capsule Take 300 mg by mouth 3 (three) times daily. 12/31/21   [provider]  dexamethasone (DECADRON) 2 MG tablet TAKE 5 TABLETS(10 MG) BY MOUTH 1 TIME A WEEK 11/08/21   Derek Jack, MD  dorzolamide-timolol (COSOPT) 22.3-6.8 MG/ML ophthalmic solution Place 1 drop into the left eye 2 (two) times daily.    [provider]  furosemide (LASIX) 20 MG tablet If you notice any swelling, you may take 40 mg (two tablets) for the day. Patient taking differently: Take 20 mg by mouth 2 (two) times daily. 03/28/21   Troy Sine, MD  isosorbide mononitrate (IMDUR) 60 MG 24 hr tablet TAKE 1 AND 1/2 TABLETS BY  MOUTH IN THE MORNING AND  1/2  TABLET AT NIGHT Patient taking differently: TAKE 60 mg BY  MOUTH IN THE MORNING AND  30 mg TABLET AT NIGHT 07/02/21   Troy Sine, MD  levothyroxine (SYNTHROID) 75 MCG tablet Take 75 mcg by mouth daily before breakfast. 07/16/21   [provider]  lidocaine-prilocaine (EMLA) cream Apply 1 Application topically as needed. 12/20/21   Derek Jack, MD  losartan (COZAAR) 100 MG tablet TAKE 1 TABLET BY MOUTH  DAILY 09/21/21   Troy Sine, MD  metoprolol tartrate (LOPRESSOR) 25 MG tablet TAKE 1 TABLET BY MOUTH IN  THE MORNING AND ONE-HALF  TABLET BY MOUTH IN THE  EVENING 07/13/21   Troy Sine, MD   Multiple Vitamins-Minerals (PRESERVISION/LUTEIN) CAPS Take 1 capsule by mouth 2 (two) times daily.    [provider]  nabumetone (RELAFEN) 500 MG tablet Take 500 mg by mouth 2 (two) times daily. 12/31/21   [provider]  nitroGLYCERIN (NITROSTAT) 0.4 MG SL tablet Place 1 tablet (0.4 mg total) under the tongue every 5 (five) minutes as needed. For chest pain 09/27/20   Troy Sine, MD  pantoprazole (PROTONIX) 40 MG tablet TAKE 1 TABLET BY MOUTH  DAILY BEFORE BREAKFAST 07/27/21   Rourk, Cristopher Estimable, MD  potassium chloride SA (KLOR-CON M) 20 MEQ tablet TAKE 1 TABLET BY MOUTH  DAILY 05/28/21   Troy Sine, MD  prochlorperazine (COMPAZINE) 10 MG tablet Take 1 tablet (10 mg total) by mouth every 6 (six) hours as needed for nausea or vomiting. 12/20/21   Derek Jack, MD  ROCKLATAN 0.02-0.005 % SOLN Place 1 drop into the left eye at bedtime. 12/21/19   [provider]  vitamin E 200 UNIT capsule Take 200 Units by mouth every evening.    [provider]      Allergies    Azithromycin, Doxazosin, Atenolol, Hydrocodone, Levofloxacin, Morphine, Penicillins, and Sulfonamide derivatives    Review of Systems   Review of Systems  Constitutional:  Negative for chills and fever.  Eyes:  Negative for visual disturbance.  Respiratory:  Negative for shortness of breath.   Cardiovascular:  Negative for chest pain.  Gastrointestinal:  Negative for abdominal pain, nausea and vomiting.  Musculoskeletal:  Negative for arthralgias and neck pain.  Skin:  Positive for wound.       Skin tears to bilateral forearms and left hand  Neurological:  Positive for syncope. Negative for dizziness, seizures, weakness and headaches.  Psychiatric/Behavioral:  Negative for confusion.     Physical Exam Updated Vital Signs BP (!) 127/56   Pulse 75   Temp 98.4 F (36.9 C) (Oral)   Resp 15   Ht '5\' 8"'  (1.727 m)   Wt 57.6 kg   SpO2 100%   BMI 19.31 kg/m  Physical Exam Vitals  and nursing note reviewed.  Constitutional:      General: He is not in acute distress.    Appearance: He is not toxic-appearing.     Comments: Patient is frail elderly appearing  HENT:     Head: Atraumatic.     Mouth/Throat:     Mouth: Mucous membranes are dry.  Eyes:     Extraocular Movements: Extraocular movements intact.     Conjunctiva/sclera: Conjunctivae normal.     Pupils: Pupils are equal, round, and reactive to light.  Cardiovascular:     Rate and Rhythm: Normal rate and regular rhythm.     Pulses: Normal pulses.  Pulmonary:     Effort: Pulmonary effort is normal.  Breath sounds: Normal breath sounds.  Chest:     Chest wall: No tenderness.  Abdominal:     Palpations: Abdomen is soft.     Tenderness: There is no abdominal tenderness.  Musculoskeletal:        General: Normal range of motion.     Cervical back: Normal range of motion. No tenderness.  Skin:    General: Skin is warm.     Capillary Refill: Capillary refill takes less than 2 seconds.     Comments: Bleeding skin tears to the bilateral forearms and left hand no edema.  Neurological:     General: No focal deficit present.     Mental Status: He is alert.     Sensory: No sensory deficit.     Motor: No weakness.     ED Results / Procedures / Treatments   Labs (all labs ordered are listed, but only abnormal results are displayed) Labs Reviewed  CBC WITH DIFFERENTIAL/PLATELET - Abnormal; Notable for the following components:      Result Value   WBC 1.5 (*)    RBC 2.26 (*)    Hemoglobin 7.6 (*)    HCT 23.7 (*)    MCV 104.9 (*)    Platelets 29 (*)    Neutro Abs 0.8 (*)    Lymphs Abs 0.4 (*)    All other components within normal limits  BASIC METABOLIC PANEL - Abnormal; Notable for the following components:   Sodium 129 (*)    Glucose, Bld 104 (*)    Calcium 8.6 (*)    All other components within normal limits  URINALYSIS, ROUTINE W REFLEX MICROSCOPIC - Abnormal; Notable for the following  components:   Ketones, ur 5 (*)    Protein, ur 30 (*)    Bacteria, UA RARE (*)    All other components within normal limits  MAGNESIUM  PREPARE PLATELET PHERESIS  TYPE AND SCREEN  PREPARE RBC (CROSSMATCH)    EKG None  Radiology CT CHEST ABDOMEN PELVIS W CONTRAST  Result Date: 01/16/2022 CLINICAL DATA:  Trauma, fall EXAM: CT CHEST, ABDOMEN, AND PELVIS WITH CONTRAST TECHNIQUE: Multidetector CT imaging of the chest, abdomen and pelvis was performed following the standard protocol during bolus administration of intravenous contrast. RADIATION DOSE REDUCTION: This exam was performed according to the departmental dose-optimization program which includes automated exposure control, adjustment of the mA and/or kV according to patient size and/or use of iterative reconstruction technique. CONTRAST:  58m OMNIPAQUE IOHEXOL 300 MG/ML  SOLN COMPARISON:  Previous studies including the CT abdomen done on 06/14/2021 FINDINGS: CT CHEST FINDINGS Cardiovascular: There is homogeneous enhancement in thoracic aorta. There are no intraluminal filling defects in central pulmonary artery branches. There is ectasia of the main pulmonary artery measuring 3.4 cm suggesting pulmonary arterial hypertension. Coronary artery calcifications are seen. Mediastinum/Nodes: There is no mediastinal hematoma. Left IJ chest port is noted with the tip of the catheter in superior vena cava. There are pockets of air in subcutaneous plane in the anterior chest, more so on the left side, possibly suggesting recent accessing of the chest port. There is a 2.4 cm loculated air containing structure in subcutaneous plane in the right anterior chest wall. Lungs/Pleura: Centrilobular and panlobular emphysema is seen. Increased interstitial markings are seen in the posterior segment of right upper lobe and in posterior right lower lobe. There is ectasia bronchi in the area of increased interstitial markings. Findings may suggest scarring or  interstitial pneumonia. There are linear densities in right middle lobe  and lingula suggesting scarring or subsegmental atelectasis. Minimal left pleural effusion is seen. There is no pneumothorax. Musculoskeletal: No acute findings are seen in bony structures in the thorax. CT ABDOMEN PELVIS FINDINGS Hepatobiliary: There are few low-density lesions in liver largest measuring 3.4 cm in the left lobe, possibly cysts. There is no dilation of bile ducts. Gallbladder stones are seen. There is no wall thickening in gallbladder. Pancreas: No focal abnormalities are seen. Spleen: Unremarkable. Adrenals/Urinary Tract: Adrenals are unremarkable. There is no hydronephrosis. There is 2 mm calculus in the right kidney. There is 1 mm calculus in the lower pole of left kidney. There is focal cortical thinning in the lateral aspect of left kidney, possibly related to previous intervention or pyelonephritis. There are no ureteral stones. Urinary bladder is unremarkable. Stomach/Bowel: Stomach is unremarkable. Small bowel loops are not dilated. Appendix is not dilated. There is no significant wall thickening in colon. There is no pericardial stranding. Diverticula are seen in the colon without signs of focal acute diverticulitis. Vascular/Lymphatic: Scattered calcifications are seen in aorta and its major branches. No significant lymphadenopathy is seen. Reproductive: Unremarkable. Other: There is no ascites or pneumoperitoneum. Right inguinal hernia containing fat is seen. Musculoskeletal: No recent fracture is seen in bony structures. IMPRESSION: No acute findings are seen in CT scan of chest, abdomen and pelvis. There is no focal pulmonary consolidation. There is no pleural effusion or pneumothorax. There is no evidence of mediastinal hematoma. Coronary artery disease. Ectasia of the main pulmonary artery suggests pulmonary arterial hypertension. There is no laceration in the solid organs. There is no bowel wall thickening. There  is no ascites or pneumoperitoneum. No displaced fractures are seen in bony structures. There is loculated pocket of air in the right anterior chest wall, most likely suggesting recent removal of right chest port. There are pockets of air adjacent to left IJ chest port suggesting recent placement of left chest port. COPD. There are scattered foci of increased interstitial markings, especially in posterior segment of right upper lobe and superior segment of right lower lobe suggesting scarring. Hepatic cysts. Small bilateral renal stones. Diverticulosis of colon. Other findings as described in the body of the report. Electronically Signed   By: Elmer Picker M.D.   On: 01/16/2022 17:30   CT Head Wo Contrast  Result Date: 01/16/2022 CLINICAL DATA:  Fall, neck trauma EXAM: CT HEAD WITHOUT CONTRAST CT CERVICAL SPINE WITHOUT CONTRAST TECHNIQUE: Multidetector CT imaging of the head and cervical spine was performed following the standard protocol without intravenous contrast. Multiplanar CT image reconstructions of the cervical spine were also generated. RADIATION DOSE REDUCTION: This exam was performed according to the departmental dose-optimization program which includes automated exposure control, adjustment of the mA and/or kV according to patient size and/or use of iterative reconstruction technique. COMPARISON:  CT head 01/04/2021 FINDINGS: CT HEAD FINDINGS Brain: Generalized atrophy without hydrocephalus. Patchy white matter hypodensity bilaterally unchanged. Negative for acute infarct, hemorrhage, mass Vascular: Negative for hyperdense vessel Skull: Negative calvarium Sinuses/Orbits: Expansile soft tissue mass in the left maxillary antrum is unchanged. This extends into the right nasal cavity. No aggressive bony destruction. Other: None CT CERVICAL SPINE FINDINGS Alignment: Normal Skull base and vertebrae: Negative for fracture or mass Soft tissues and spinal canal: No soft tissue mass or adenopathy.  Atherosclerotic calcification of the carotid bifurcation bilaterally left jugular vascular catheter with gas in the soft tissues likely due to recent placement. Disc levels: Mild disc degeneration. Moderate bilateral facet degeneration. Upper chest: Apical emphysema  without acute abnormality Other: None IMPRESSION: 1. No acute intracranial injury. 2. Negative for cervical spine fracture 3. Expansile soft tissue mass in the left maxillary sinus extending into the left nasal cavity. This is unchanged from CT of 01/04/2021. 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 Electronically Signed   By: Franchot Gallo M.D.   On: 01/16/2022 17:17   CT Cervical Spine Wo Contrast  Result Date: 01/16/2022 CLINICAL DATA:  Fall, neck trauma EXAM: CT HEAD WITHOUT CONTRAST CT CERVICAL SPINE WITHOUT CONTRAST TECHNIQUE: Multidetector CT imaging of the head and cervical spine was performed following the standard protocol without intravenous contrast. Multiplanar CT image reconstructions of the cervical spine were also generated. RADIATION DOSE REDUCTION: This exam was performed according to the departmental dose-optimization program which includes automated exposure control, adjustment of the mA and/or kV according to patient size and/or use of iterative reconstruction technique. COMPARISON:  CT head 01/04/2021 FINDINGS: CT HEAD FINDINGS Brain: Generalized atrophy without hydrocephalus. Patchy white matter hypodensity bilaterally unchanged. Negative for acute infarct, hemorrhage, mass Vascular: Negative for hyperdense vessel Skull: Negative calvarium Sinuses/Orbits: Expansile soft tissue mass in the left maxillary antrum is unchanged. This extends into the right nasal cavity. No aggressive bony destruction. Other: None CT CERVICAL SPINE FINDINGS Alignment: Normal Skull base and vertebrae: Negative for fracture or mass Soft tissues and spinal canal: No soft tissue mass or adenopathy. Atherosclerotic calcification of the carotid bifurcation  bilaterally left jugular vascular catheter with gas in the soft tissues likely due to recent placement. Disc levels: Mild disc degeneration. Moderate bilateral facet degeneration. Upper chest: Apical emphysema without acute abnormality Other: None IMPRESSION: 1. No acute intracranial injury. 2. Negative for cervical spine fracture 3. Expansile soft tissue mass in the left maxillary sinus extending into the left nasal cavity. This is unchanged from CT of 01/04/2021. 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 Electronically Signed   By: Franchot Gallo M.D.   On: 01/16/2022 17:17   IR IMAGING GUIDED PORT INSERTION  Result Date: 01/15/2022 INDICATION: Port malfunction on recently placed port, 12/18/2021. Hub detachment. EXAM: Procedures: 1. IMPLANTED PORT A CATH PLACEMENT WITH ULTRASOUND AND FLUOROSCOPIC GUIDANCE 2. REMOVAL OF IMPLANTED TUNNELED PORT-A-CATH MEDICATIONS: Ancef 2 gm IV; The antibiotic was administered within an appropriate time interval prior to skin puncture. ANESTHESIA/SEDATION: Moderate (conscious) sedation was employed during this procedure. A total of Versed 2 mg and Fentanyl 100 mcg was administered intravenously. Moderate Sedation Time: 58 minutes. The patient's level of consciousness and vital signs were monitored continuously by radiology nursing throughout the procedure under my direct supervision. FLUOROSCOPY TIME:  Fluoroscopic dose; 2 mGy COMPLICATIONS: None immediate. PROCEDURE: The procedure, risks, benefits, and alternatives were explained to the patient and/or patient's representative . Questions regarding the procedure were encouraged and answered. The patient and/or patient's representative understands and consents to the procedure. PORT PLACEMENT; The LEFT neck and chest was prepped with chlorhexidine in a sterile fashion, and a sterile drape was applied covering the operative field. Maximum barrier sterile technique with sterile gowns and gloves were used for the procedure. A timeout was  performed prior to the initiation of the procedure. Local anesthesia was provided with 1% lidocaine with epinephrine. After creating a small venotomy incision, a micropuncture kit was utilized to access the internal jugular vein under direct, real-time ultrasound guidance. Ultrasound image documentation was performed. The microwire was kinked to measure appropriate catheter length. A subcutaneous port pocket was then created along the upper chest wall utilizing a combination of sharp and blunt dissection. The pocket was irrigated with sterile saline.  A single lumen Non-ISP power injectable port was chosen for placement. The 8 Fr catheter was tunneled from the port pocket site to the venotomy incision. The port was placed in the pocket. The external catheter was trimmed to appropriate length. At the venotomy, an 8 Fr peel-away sheath was placed over a guidewire under fluoroscopic guidance. The catheter was then placed through the sheath and the sheath was removed. Final catheter positioning was confirmed and documented with a fluoroscopic spot radiograph. The port was accessed with a Huber needle, aspirated and flushed with heparinized saline. The port pocket incision was closed with interrupted 3-0 Vicryl suture then Dermabond was applied, including at the venotomy incision. Dressings were placed. PORT REMOVAL; Attention was then directed to the RIGHT chest and previously placed Port-A-Cath. The site had been prepped with chlorhexidine. Local anesthesia was provided with 1% lidocaine with epinephrine. A timeout was performed prior to the initiation of the procedure. An incision was made overlying the Port-A-Cath with a scalpel. Utilizing sharp and blunt dissection, the Port-A-Cath was removed completely. The pocked was irrigated with sterile saline. Wound closure was performed with interrupted subcutaneous 2-0 Vicryl sutures then Dermabond was applied at the skin. Dressings were applied. The patient tolerated the  procedures well without immediate post procedural complication. IMPRESSION: 1. Successful placement of a LEFT chest internal jugular approach power injectable Port-A-Cath. The tip of the catheter is positioned within the superior cavoatrial junction. The catheter is ready for immediate use. 2. Successful removal of implanted RIGHT chest Port-A-Cath without immediate post procedural complication. Michaelle Birks, MD [facility] Electronically Signed   By: Michaelle Birks M.D.   On: 01/15/2022 23:15   IR REMOVAL TUN ACCESS W/ PORT W/O FL MOD SED  Result Date: 01/15/2022 INDICATION: Port malfunction on recently placed port, 12/18/2021. Hub detachment. EXAM: Procedures: 1. IMPLANTED PORT A CATH PLACEMENT WITH ULTRASOUND AND FLUOROSCOPIC GUIDANCE 2. REMOVAL OF IMPLANTED TUNNELED PORT-A-CATH MEDICATIONS: Ancef 2 gm IV; The antibiotic was administered within an appropriate time interval prior to skin puncture. ANESTHESIA/SEDATION: Moderate (conscious) sedation was employed during this procedure. A total of Versed 2 mg and Fentanyl 100 mcg was administered intravenously. Moderate Sedation Time: 58 minutes. The patient's level of consciousness and vital signs were monitored continuously by radiology nursing throughout the procedure under my direct supervision. FLUOROSCOPY TIME:  Fluoroscopic dose; 2 mGy COMPLICATIONS: None immediate. PROCEDURE: The procedure, risks, benefits, and alternatives were explained to the patient and/or patient's representative . Questions regarding the procedure were encouraged and answered. The patient and/or patient's representative understands and consents to the procedure. PORT PLACEMENT; The LEFT neck and chest was prepped with chlorhexidine in a sterile fashion, and a sterile drape was applied covering the operative field. Maximum barrier sterile technique with sterile gowns and gloves were used for the procedure. A timeout was performed  prior to the initiation of the procedure. Local anesthesia was provided with 1% lidocaine with epinephrine. After creating a small venotomy incision, a micropuncture kit was utilized to access the internal jugular vein under direct, real-time ultrasound guidance. Ultrasound image documentation was performed. The microwire was kinked to measure appropriate catheter length. A subcutaneous port pocket was then created along the upper chest wall utilizing a combination of sharp and blunt dissection. The pocket was irrigated with sterile saline. A single lumen Non-ISP power injectable port was chosen for placement. The 8 Fr catheter was tunneled from the port pocket site to the venotomy incision. The port was placed in the  pocket. The external catheter was trimmed to appropriate length. At the venotomy, an 8 Fr peel-away sheath was placed over a guidewire under fluoroscopic guidance. The catheter was then placed through the sheath and the sheath was removed. Final catheter positioning was confirmed and documented with a fluoroscopic spot radiograph. The port was accessed with a Huber needle, aspirated and flushed with heparinized saline. The port pocket incision was closed with interrupted 3-0 Vicryl suture then Dermabond was applied, including at the venotomy incision. Dressings were placed. PORT REMOVAL; Attention was then directed to the RIGHT chest and previously placed Port-A-Cath. The site had been prepped with chlorhexidine. Local anesthesia was provided with 1% lidocaine with epinephrine. A timeout was performed prior to the initiation of the procedure. An incision was made overlying the Port-A-Cath with a scalpel. Utilizing sharp and blunt dissection, the Port-A-Cath was removed completely. The pocked was irrigated with sterile saline. Wound closure was performed with interrupted subcutaneous 2-0 Vicryl sutures then Dermabond was applied at the skin. Dressings were applied. The patient tolerated the procedures  well without immediate post procedural complication. IMPRESSION: 1. Successful placement of a LEFT chest internal jugular approach power injectable Port-A-Cath. The tip of the catheter is positioned within the superior cavoatrial junction. The catheter is ready for immediate use. 2. Successful removal of implanted RIGHT chest Port-A-Cath without immediate post procedural complication. Michaelle Birks, MD [facility] Electronically Signed   By: Michaelle Birks M.D.   On: 01/15/2022 23:15    Procedures Procedures    Medications Ordered in ED Medications  0.9 %  sodium chloride infusion (has no administration in time range)  0.9 %  sodium chloride infusion (Manually program via Guardrails IV Fluids) (has no administration in time range)  sodium chloride 0.9 % bolus 500 mL (0 mLs Intravenous Stopped 01/16/22 1630)  iohexol (OMNIPAQUE) 300 MG/ML solution 100 mL (80 mLs Intravenous Contrast Given 01/16/22 1623)    ED Course/ Medical Decision Making/ A&P                           Medical Decision Making Patient here for evaluation of syncope and fall earlier today.  Had replacement Port-A-Cath placed yesterday at [facility].  Did not have food or drink most of the day.  History of multiple myeloma and ITP.  On exam, patient alert oriented, he has skin tears to the bilateral forearms and left hand that are bleeding.  Wounds are superficial.  No obvious signs of head injury.  Differential diagnosis would include but not limited to fracture, subdural hematoma, skull fracture, intracranial bleed, musculoskeletal injury, cardiac process, electrolyte imbalance.  Will check labs and CT head neck chest abdomen and pelvis wounds will be cleaned and Steri-Stripped  Amount and/or Complexity of Data Reviewed Labs: ordered.    Details: Labs interpreted by me, significant leukopenia with white count of 1.5.  This is a significant change from 9.8 six days ago.   Hemoglobin near baseline at 7.6, patient is significantly thrombocytopenic with platelet count of 29 this is also a significant drop from 6 days ago when platelet count was 111.  Magnesium unremarkable.  Urine without evidence of infection.  Hyponatremic with sodium of 129, remaining chemistries unremarkable. Radiology: ordered.    Details: CT head, C-spine, chest, abdomen and pelvis all without acute findings. Discussion of management or test interpretation with external provider(s): Discussed findings with heme-onc, Dr. Grayland Ormond who felt patient could be discharged home  with close follow-up with his oncologist providing bleeding is controlled.  Wounds cleaned with saline and skin approximated with Steri-Strips.  Mild pressure dressing also applied.  Bleeding seems to be controlled at this time.  Patient will be transfused platelets, he will need hospital admission.  He is agreeable to plan. Discussed findings with [facility] hospitalist, Dr. Waldron Labs who agrees to admit.   Risk Prescription drug management. Decision regarding hospitalization.    CRITICAL CARE Performed by: Mayah Urquidi Total critical care time: 40 minutes Critical care time was exclusive of separately billable procedures and treating other patients. Critical care was necessary to treat or prevent imminent or life-threatening deterioration. Critical care was time spent personally by me on the following activities: development of treatment plan with patient and/or surrogate as well as nursing, discussions with consultants, evaluation of patient's response to treatment, examination of patient, obtaining history from patient or surrogate, ordering and performing treatments and interventions, ordering and review of laboratory studies, ordering and review of radiographic studies, pulse oximetry and re-evaluation of patient's condition.          Final Clinical Impression(s) / ED Diagnoses Final diagnoses:  Syncope,  unspecified syncope type  Thrombocytopenia [facility])    Rx / DC Orders ED Discharge Orders     None         Bufford Lope 01/16/22 2209    Godfrey Pick, MD 01/17/22 [phone]

## 2022-01-16 NOTE — H&P (Signed)
TRH H&P   Patient Demographics:    Dale [name], is a 88 y.o. male  MRN: [id number]   DOB - 1928-06-29  Admit Date - 01/16/2022  Outpatient Primary MD for the patient is Redmond School, MD  Referring MD/NP/PA: PA Triplett  Outpatient Specialists: oncology Dr Tera Helper    Patient coming from: home  Chief Complaint  Patient presents with   Fall      HPI:    Dale [name]  is a 88 y.o. male, with past medical history of multiple myeloma, on chemotherapy Revlimid and Velcade, most recent 9/7, chronic anemia, microcytic anemia, ITP, CAD status post remote stenting, hypertension, BPH, hyperlipidemia. -Patient presents to ED secondary to syncope, patient had malfunction in his right upper chest Port-A-Cath, he spent all day yesterday at [facility] long hospital, for Port-A-Cath exchange, right Port-A-Cath was removed, he had new left upper Port-A-Cath inserted, he was n.p.o. all day long for this procedure, upon he got home he was so tired and fatigued did not have much to eat, and he had a vomiting episode, this morning he had a syncope, patient report generalized weakness, fatigue and tired, he was walking to the kitchen, felt dizzy, lightheaded, fell on the floor, he denies any head trauma, but he had multiple upper extremity skin lacerations. -In ED patient had pan CT including CT head, cervical spine, chest/abdomen/pelvis, with no evidence of internal trauma, or head bleed, only significant for his skin laceration, work-up was significant for anemia with hemoglobin of 7.6, platelet count was significantly low at 29K, sodium was low at 129, white blood cell count was low at 1.5K, [facility] hospitalist consulted to admit.    Review of systems:      A full 10 point Review of Systems was done, except as stated above, all other Review of Systems were negative.   With Past History of the  following :    Past Medical History:  Diagnosis Date   Allergic rhinitis    Anal fissure    Anginal pain ([place])    Asthma    Cancer ([place])    skin   Chest pain    CHF (congestive heart failure) ([place])    Coronary heart disease    s/p stenting. cath in 01/2012 noncritical occlusion   Dyspnea    Dysrhythmia    1st degree heart block   GERD (gastroesophageal reflux disease)    Glaucoma    Hiatal hernia    Hyperlipidemia    Hypertension    Hypothyroidism    Idiopathic thrombocytopenic purpura ([place]) 2002   Macular degeneration    Nephrolithiasis    PUD (peptic ulcer disease)    remote   Sarcoidosis    pulmonary   Schatzki's ring       Past Surgical History:  Procedure Laterality Date   cardiac stents     COLONOSCOPY  10/30/2006   Normal rectum,  sigmoid diverticula.Remainder of colonic mucosa appeared normal.   CORONARY ANGIOPLASTY WITH STENT PLACEMENT     about 10 years ago per pt (around 2007)   [place], URETEROSCOPY AND STENT PLACEMENT Left 06/16/2017   Procedure: CYSTOSCOPY WITH RETROGRADE PYELOGRAM, URETEROSCOPY,STONE EXTRACTION  AND STENT PLACEMENT;  Surgeon: Franchot Gallo, MD;  Location: [facility];  Service: Urology;  Laterality: Left;   ESOPHAGOGASTRODUODENOSCOPY  06/19/2004   Two esophageal rings and esophageal web as described above.  All of these were disrupted by passing 56-French Venia Minks dilator/ Candida esophagitis,which appears to be incidental given history of   antibiotic use, but nevertheless will be treated.   ESOPHAGOGASTRODUODENOSCOPY  10/30/2006   Distal tandem esophageal ring status post dilation disruption as  described above.  Otherwise normal esophagus/  Small hiatal hernia otherwise normal stomach, D1 and D2   ESOPHAGOGASTRODUODENOSCOPY N/A 03/22/2015   Dr.Rourk- noncritical schatzki's ring and hiatal hernia-o/w normal EGD.    ESOPHAGOGASTRODUODENOSCOPY (EGD) WITH ESOPHAGEAL DILATION  03/04/2012   RMR- schatzki's ring,  hiatal hernia, polypoid gastric mucosa, bx= minimally active gastritis.   HOLMIUM LASER APPLICATION Left 0/35/0093   Procedure: HOLMIUM LASER APPLICATION;  Surgeon: Franchot Gallo, MD;  Location: [facility];  Service: Urology;  Laterality: Left;   IR CV LINE INJECTION  12/27/2021   IR GENERIC HISTORICAL  03/06/2016   IR RADIOLOGIST EVAL & MGMT 03/06/2016 Aletta Edouard, MD [facility] INTER[facility] RAD   IR GENERIC HISTORICAL  06/18/2016   IR RADIOLOGIST EVAL & MGMT 06/18/2016 Aletta Edouard, MD [facility] INTER[facility] RAD   IR IMAGING GUIDED PORT INSERTION  12/18/2021   IR IMAGING GUIDED PORT INSERTION  01/15/2022   IR RADIOLOGIST EVAL & MGMT  10/01/2016   IR RADIOLOGIST EVAL & MGMT  10/15/2017   IR RADIOLOGIST EVAL & MGMT  12/24/2018   IR RADIOLOGIST EVAL & MGMT  01/04/2020   IR RADIOLOGIST EVAL & MGMT  06/20/2021   IR REMOVAL TUN ACCESS W/ PORT W/O FL MOD SED  01/15/2022   LEFT HEART CATH N/A 02/02/2012   Procedure: LEFT HEART CATH;  Surgeon: Lorretta Harp, MD;  Location[facility];  Service: Cardiovascular;  Laterality: N/A;   MEDIASTINOSCOPY     for dx sarcoid   RADIOLOGY WITH ANESTHESIA Left 05/17/2016   Procedure: left renal ablation;  Surgeon: Aletta Edouard, MD;  Location: [facility];  Service: Radiology;  Laterality: Left;      Social History:     Social History   Tobacco Use   Smoking status: Former    Packs/day: 0.10    Years: 2.00    Total pack years: 0.20    Types: Cigarettes, Cigars    Quit date: 05/06/1970    Years since quitting: 51.7   Smokeless tobacco: Never  Substance Use Topics   Alcohol use: No    Alcohol/week: 0.0 standard drinks of alcohol       Family History :     Family History  Problem Relation Age of Onset   Heart disease Father        deceased age 24   Stroke Mother    Alzheimer's disease Mother    Heart attack Brother        deceased age 69   Cancer Other        niece   Colon cancer Neg Hx       Home Medications:   Prior to Admission medications   Medication  Sig Start Date End Date Taking? Authorizing Provider  triamcinolone (KENALOG) 0.1 %  cream Apply 1 application. topically 2 (two) times daily as needed (for irritation).   Yes [provider]  acyclovir (ZOVIRAX) 400 MG tablet Take 1 tablet (400 mg total) by mouth 2 (two) times daily. 11/08/21   Derek Jack, MD  Ascorbic Acid (VITAMIN C PO) Take 500 mg by mouth every evening.    [provider]  aspirin EC 81 MG tablet Take 81 mg by mouth every Monday, Wednesday, and Friday. Swallow whole.    [provider]  atorvastatin (LIPITOR) 40 MG tablet TAKE 1 TABLET BY MOUTH IN  THE EVENING 09/10/21   Troy Sine, MD  Bortezomib (VELCADE IJ) Inject as directed once a week.    [provider]  brimonidine (ALPHAGAN) 0.2 % ophthalmic solution Place 1 drop into the left eye 2 (two) times daily.    [provider]  clindamycin (CLEOCIN) 300 MG capsule Take 300 mg by mouth 3 (three) times daily. 12/31/21   [provider]  dexamethasone (DECADRON) 2 MG tablet TAKE 5 TABLETS(10 MG) BY MOUTH 1 TIME A WEEK 11/08/21   Derek Jack, MD  dorzolamide-timolol (COSOPT) 22.3-6.8 MG/ML ophthalmic solution Place 1 drop into the left eye 2 (two) times daily.    [provider]  furosemide (LASIX) 20 MG tablet If you notice any swelling, you may take 40 mg (two tablets) for the day. Patient taking differently: Take 20 mg by mouth 2 (two) times daily. 03/28/21   Troy Sine, MD  isosorbide mononitrate (IMDUR) 60 MG 24 hr tablet TAKE 1 AND 1/2 TABLETS BY  MOUTH IN THE MORNING AND  1/2 TABLET AT NIGHT Patient taking differently: TAKE 60 mg BY  MOUTH IN THE MORNING AND  30 mg TABLET AT NIGHT 07/02/21   Troy Sine, MD  levothyroxine (SYNTHROID) 75 MCG tablet Take 75 mcg by mouth daily before breakfast. 07/16/21   [provider]  lidocaine-prilocaine (EMLA) cream Apply 1 Application topically as needed. 12/20/21   Derek Jack, MD   losartan (COZAAR) 100 MG tablet TAKE 1 TABLET BY MOUTH  DAILY 09/21/21   Troy Sine, MD  metoprolol tartrate (LOPRESSOR) 25 MG tablet TAKE 1 TABLET BY MOUTH IN  THE MORNING AND ONE-HALF  TABLET BY MOUTH IN THE  EVENING 07/13/21   Troy Sine, MD  Multiple Vitamins-Minerals (PRESERVISION/LUTEIN) CAPS Take 1 capsule by mouth 2 (two) times daily.    [provider]  nabumetone (RELAFEN) 500 MG tablet Take 500 mg by mouth 2 (two) times daily. 12/31/21   [provider]  nitroGLYCERIN (NITROSTAT) 0.4 MG SL tablet Place 1 tablet (0.4 mg total) under the tongue every 5 (five) minutes as needed. For chest pain 09/27/20   Troy Sine, MD  pantoprazole (PROTONIX) 40 MG tablet TAKE 1 TABLET BY MOUTH  DAILY BEFORE BREAKFAST 07/27/21   Rourk, Cristopher Estimable, MD  potassium chloride SA (KLOR-CON M) 20 MEQ tablet TAKE 1 TABLET BY MOUTH  DAILY 05/28/21   Troy Sine, MD  prochlorperazine (COMPAZINE) 10 MG tablet Take 1 tablet (10 mg total) by mouth every 6 (six) hours as needed for nausea or vomiting. 12/20/21   Derek Jack, MD  ROCKLATAN 0.02-0.005 % SOLN Place 1 drop into the left eye at bedtime. 12/21/19   [provider]  vitamin E 200 UNIT capsule Take 200 Units by mouth every evening.    [provider]     Allergies:     Allergies  Allergen Reactions   Azithromycin  Other (See Comments)    Sore mouth and fever blisters around mouth, sores in nose area as well   Doxazosin Shortness Of Breath   Atenolol Other (See Comments)    UNKNOWN REACTION   Hydrocodone Nausea And Vomiting   Levofloxacin Other (See Comments)    Caused stomach problems.   Morphine Other (See Comments)    "made me crazy"   Penicillins Nausea And Vomiting and Other (See Comments)    Has patient had a PCN reaction causing immediate rash, facial/tongue/throat swelling, SOB or lightheadedness with hypotension: No Has patient had a PCN reaction causing severe rash involving mucus  membranes or skin necrosis: No Has patient had a PCN reaction that required hospitalization No Has patient had a PCN reaction occurring within the last 10 years: No If all of the above answers are "NO", then may proceed with Cephalosporin use.    Sulfonamide Derivatives Nausea And Vomiting     Physical Exam:   Vitals  Blood pressure (!) 127/56, pulse 75, temperature 98.4 F (36.9 C), temperature source Oral, resp. rate 15, height '5\' 8"'  (1.727 m), weight 57.6 kg, SpO2 100 %.   1. General frail, deconditioned chronically appearing male, laying in bed in no apparent distress  2. Normal affect and insight, Not Suicidal or Homicidal, Awake Alert, Oriented X 3.  3. No F.N deficits, ALL C.Nerves Intact, Strength 5/5 all 4 extremities, Sensation intact all 4 extremities, Plantars down going.  4. Ears and Eyes appear Normal, Conjunctivae clear, PERRLA. Moist Oral Mucosa.  5. Supple Neck, No JVD, No cervical lymphadenopathy appriciated, No Carotid Bruits.  6. Symmetrical Chest wall movement, Good air movement bilaterally, CTAB.  7. RRR, No Gallops, Rubs or Murmurs, No Parasternal Heave.  8. Positive Bowel Sounds, Abdomen Soft, No tenderness, No organomegaly appriciated,No rebound -guarding or rigidity.  9.  With multiple bruising, mainly in the chest area at site of recent left upper chest with Port-A-Cath, as well he is having bandaged wounds in bilateral upper extremities  10.  Muscle wasting, no effusion in joints noted       Data Review:    CBC Recent Labs  Lab 01/10/22 0841 01/16/22 1435  WBC 9.8 1.5*  HGB 6.7* 7.6*  HCT 20.5* 23.7*  PLT 111* 29*  MCV 111.4* 104.9*  MCH 36.4* 33.6  MCHC 32.7 32.1  RDW Not Measured Not Measured  LYMPHSABS 0.5* 0.4*  MONOABS 2.4* 0.2  EOSABS 0.0 0.0  BASOSABS 0.0 0.0   ------------------------------------------------------------------------------------------------------------------  Chemistries  Recent Labs  Lab 01/10/22 0841  01/16/22 1435  NA 132* 129*  K 4.2 3.9  CL 106 100  CO2 21* 22  GLUCOSE 145* 104*  BUN 31* 21  CREATININE 0.99 0.94  CALCIUM 8.9 8.6*  MG 2.1 2.0  AST 15  --   ALT 15  --   ALKPHOS 61  --   BILITOT 1.2  --    ------------------------------------------------------------------------------------------------------------------ estimated creatinine clearance is 41.7 mL/min (by C-G formula based on SCr of 0.94 mg/dL). ------------------------------------------------------------------------------------------------------------------ No results for input(s): "TSH", "T4TOTAL", "T3FREE", "THYROIDAB" in the last 72 hours.  Invalid input(s): "FREET3"  Coagulation profile No results for input(s): "INR", "PROTIME" in the last 168 hours. ------------------------------------------------------------------------------------------------------------------- No results for input(s): "DDIMER" in the last 72 hours. -------------------------------------------------------------------------------------------------------------------  Cardiac Enzymes No results for input(s): "CKMB", "TROPONINI", "MYOGLOBIN" in the last 168 hours.  Invalid input(s): "CK" ------------------------------------------------------------------------------------------------------------------    Component Value Date/Time   BNP 252.0 (H) 07/29/2021 9381     ---------------------------------------------------------------------------------------------------------------  Urinalysis  Component Value Date/Time   COLORURINE YELLOW 01/16/2022 1408   APPEARANCEUR CLEAR 01/16/2022 1408   APPEARANCEUR Clear 08/21/2021 1415   LABSPEC 1.017 01/16/2022 1408   PHURINE 5.0 01/16/2022 1408   GLUCOSEU NEGATIVE 01/16/2022 1408   HGBUR NEGATIVE 01/16/2022 1408   BILIRUBINUR NEGATIVE 01/16/2022 1408   BILIRUBINUR Negative 08/21/2021 1415   KETONESUR 5 (A) 01/16/2022 1408   PROTEINUR 30 (A) 01/16/2022 1408   UROBILINOGEN 1.0 02/02/2012  0630   NITRITE NEGATIVE 01/16/2022 1408   LEUKOCYTESUR NEGATIVE 01/16/2022 1408    ----------------------------------------------------------------------------------------------------------------   Imaging Results:    CT CHEST ABDOMEN PELVIS W CONTRAST  Result Date: 01/16/2022 CLINICAL DATA:  Trauma, fall EXAM: CT CHEST, ABDOMEN, AND PELVIS WITH CONTRAST TECHNIQUE: Multidetector CT imaging of the chest, abdomen and pelvis was performed following the standard protocol during bolus administration of intravenous contrast. RADIATION DOSE REDUCTION: This exam was performed according to the departmental dose-optimization program which includes automated exposure control, adjustment of the mA and/or kV according to patient size and/or use of iterative reconstruction technique. CONTRAST:  39m OMNIPAQUE IOHEXOL 300 MG/ML  SOLN COMPARISON:  Previous studies including the CT abdomen done on 06/14/2021 FINDINGS: CT CHEST FINDINGS Cardiovascular: There is homogeneous enhancement in thoracic aorta. There are no intraluminal filling defects in central pulmonary artery branches. There is ectasia of the main pulmonary artery measuring 3.4 cm suggesting pulmonary arterial hypertension. Coronary artery calcifications are seen. Mediastinum/Nodes: There is no mediastinal hematoma. Left IJ chest port is noted with the tip of the catheter in superior vena cava. There are pockets of air in subcutaneous plane in the anterior chest, more so on the left side, possibly suggesting recent accessing of the chest port. There is a 2.4 cm loculated air containing structure in subcutaneous plane in the right anterior chest wall. Lungs/Pleura: Centrilobular and panlobular emphysema is seen. Increased interstitial markings are seen in the posterior segment of right upper lobe and in posterior right lower lobe. There is ectasia bronchi in the area of increased interstitial markings. Findings may suggest scarring or interstitial pneumonia.  There are linear densities in right middle lobe and lingula suggesting scarring or subsegmental atelectasis. Minimal left pleural effusion is seen. There is no pneumothorax. Musculoskeletal: No acute findings are seen in bony structures in the thorax. CT ABDOMEN PELVIS FINDINGS Hepatobiliary: There are few low-density lesions in liver largest measuring 3.4 cm in the left lobe, possibly cysts. There is no dilation of bile ducts. Gallbladder stones are seen. There is no wall thickening in gallbladder. Pancreas: No focal abnormalities are seen. Spleen: Unremarkable. Adrenals/Urinary Tract: Adrenals are unremarkable. There is no hydronephrosis. There is 2 mm calculus in the right kidney. There is 1 mm calculus in the lower pole of left kidney. There is focal cortical thinning in the lateral aspect of left kidney, possibly related to previous intervention or pyelonephritis. There are no ureteral stones. Urinary bladder is unremarkable. Stomach/Bowel: Stomach is unremarkable. Small bowel loops are not dilated. Appendix is not dilated. There is no significant wall thickening in colon. There is no pericardial stranding. Diverticula are seen in the colon without signs of focal acute diverticulitis. Vascular/Lymphatic: Scattered calcifications are seen in aorta and its major branches. No significant lymphadenopathy is seen. Reproductive: Unremarkable. Other: There is no ascites or pneumoperitoneum. Right inguinal hernia containing fat is seen. Musculoskeletal: No recent fracture is seen in bony structures. IMPRESSION: No acute findings are seen in CT scan of chest, abdomen and pelvis. There is no focal pulmonary consolidation. There is no  pleural effusion or pneumothorax. There is no evidence of mediastinal hematoma. Coronary artery disease. Ectasia of the main pulmonary artery suggests pulmonary arterial hypertension. There is no laceration in the solid organs. There is no bowel wall thickening. There is no ascites or  pneumoperitoneum. No displaced fractures are seen in bony structures. There is loculated pocket of air in the right anterior chest wall, most likely suggesting recent removal of right chest port. There are pockets of air adjacent to left IJ chest port suggesting recent placement of left chest port. COPD. There are scattered foci of increased interstitial markings, especially in posterior segment of right upper lobe and superior segment of right lower lobe suggesting scarring. Hepatic cysts. Small bilateral renal stones. Diverticulosis of colon. Other findings as described in the body of the report. Electronically Signed   By: Elmer Picker M.D.   On: 01/16/2022 17:30   CT Head Wo Contrast  Result Date: 01/16/2022 CLINICAL DATA:  Fall, neck trauma EXAM: CT HEAD WITHOUT CONTRAST CT CERVICAL SPINE WITHOUT CONTRAST TECHNIQUE: Multidetector CT imaging of the head and cervical spine was performed following the standard protocol without intravenous contrast. Multiplanar CT image reconstructions of the cervical spine were also generated. RADIATION DOSE REDUCTION: This exam was performed according to the departmental dose-optimization program which includes automated exposure control, adjustment of the mA and/or kV according to patient size and/or use of iterative reconstruction technique. COMPARISON:  CT head 01/04/2021 FINDINGS: CT HEAD FINDINGS Brain: Generalized atrophy without hydrocephalus. Patchy white matter hypodensity bilaterally unchanged. Negative for acute infarct, hemorrhage, mass Vascular: Negative for hyperdense vessel Skull: Negative calvarium Sinuses/Orbits: Expansile soft tissue mass in the left maxillary antrum is unchanged. This extends into the right nasal cavity. No aggressive bony destruction. Other: None CT CERVICAL SPINE FINDINGS Alignment: Normal Skull base and vertebrae: Negative for fracture or mass Soft tissues and spinal canal: No soft tissue mass or adenopathy. Atherosclerotic  calcification of the carotid bifurcation bilaterally left jugular vascular catheter with gas in the soft tissues likely due to recent placement. Disc levels: Mild disc degeneration. Moderate bilateral facet degeneration. Upper chest: Apical emphysema without acute abnormality Other: None IMPRESSION: 1. No acute intracranial injury. 2. Negative for cervical spine fracture 3. Expansile soft tissue mass in the left maxillary sinus extending into the left nasal cavity. This is unchanged from CT of 01/04/2021. 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 Electronically Signed   By: Franchot Gallo M.D.   On: 01/16/2022 17:17   CT Cervical Spine Wo Contrast  Result Date: 01/16/2022 CLINICAL DATA:  Fall, neck trauma EXAM: CT HEAD WITHOUT CONTRAST CT CERVICAL SPINE WITHOUT CONTRAST TECHNIQUE: Multidetector CT imaging of the head and cervical spine was performed following the standard protocol without intravenous contrast. Multiplanar CT image reconstructions of the cervical spine were also generated. RADIATION DOSE REDUCTION: This exam was performed according to the departmental dose-optimization program which includes automated exposure control, adjustment of the mA and/or kV according to patient size and/or use of iterative reconstruction technique. COMPARISON:  CT head 01/04/2021 FINDINGS: CT HEAD FINDINGS Brain: Generalized atrophy without hydrocephalus. Patchy white matter hypodensity bilaterally unchanged. Negative for acute infarct, hemorrhage, mass Vascular: Negative for hyperdense vessel Skull: Negative calvarium Sinuses/Orbits: Expansile soft tissue mass in the left maxillary antrum is unchanged. This extends into the right nasal cavity. No aggressive bony destruction. Other: None CT CERVICAL SPINE FINDINGS Alignment: Normal Skull base and vertebrae: Negative for fracture or mass Soft tissues and spinal canal: No soft tissue mass or adenopathy. Atherosclerotic calcification of the carotid  bifurcation bilaterally left jugular  vascular catheter with gas in the soft tissues likely due to recent placement. Disc levels: Mild disc degeneration. Moderate bilateral facet degeneration. Upper chest: Apical emphysema without acute abnormality Other: None IMPRESSION: 1. No acute intracranial injury. 2. Negative for cervical spine fracture 3. Expansile soft tissue mass in the left maxillary sinus extending into the left nasal cavity. This is unchanged from CT of 01/04/2021. 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 Electronically Signed   By: Franchot Gallo M.D.   On: 01/16/2022 17:17   IR IMAGING GUIDED PORT INSERTION  Result Date: 01/15/2022 INDICATION: Port malfunction on recently placed port, 12/18/2021. Hub detachment. EXAM: Procedures: 1. IMPLANTED PORT A CATH PLACEMENT WITH ULTRASOUND AND FLUOROSCOPIC GUIDANCE 2. REMOVAL OF IMPLANTED TUNNELED PORT-A-CATH MEDICATIONS: Ancef 2 gm IV; The antibiotic was administered within an appropriate time interval prior to skin puncture. ANESTHESIA/SEDATION: Moderate (conscious) sedation was employed during this procedure. A total of Versed 2 mg and Fentanyl 100 mcg was administered intravenously. Moderate Sedation Time: 58 minutes. The patient's level of consciousness and vital signs were monitored continuously by radiology nursing throughout the procedure under my direct supervision. FLUOROSCOPY TIME:  Fluoroscopic dose; 2 mGy COMPLICATIONS: None immediate. PROCEDURE: The procedure, risks, benefits, and alternatives were explained to the patient and/or patient's representative . Questions regarding the procedure were encouraged and answered. The patient and/or patient's representative understands and consents to the procedure. PORT PLACEMENT; The LEFT neck and chest was prepped with chlorhexidine in a sterile fashion, and a sterile drape was applied covering the operative field. Maximum barrier sterile technique with sterile gowns and gloves were used for the procedure. A timeout was performed prior to the  initiation of the procedure. Local anesthesia was provided with 1% lidocaine with epinephrine. After creating a small venotomy incision, a micropuncture kit was utilized to access the internal jugular vein under direct, real-time ultrasound guidance. Ultrasound image documentation was performed. The microwire was kinked to measure appropriate catheter length. A subcutaneous port pocket was then created along the upper chest wall utilizing a combination of sharp and blunt dissection. The pocket was irrigated with sterile saline. A single lumen Non-ISP power injectable port was chosen for placement. The 8 Fr catheter was tunneled from the port pocket site to the venotomy incision. The port was placed in the pocket. The external catheter was trimmed to appropriate length. At the venotomy, an 8 Fr peel-away sheath was placed over a guidewire under fluoroscopic guidance. The catheter was then placed through the sheath and the sheath was removed. Final catheter positioning was confirmed and documented with a fluoroscopic spot radiograph. The port was accessed with a Huber needle, aspirated and flushed with heparinized saline. The port pocket incision was closed with interrupted 3-0 Vicryl suture then Dermabond was applied, including at the venotomy incision. Dressings were placed. PORT REMOVAL; Attention was then directed to the RIGHT chest and previously placed Port-A-Cath. The site had been prepped with chlorhexidine. Local anesthesia was provided with 1% lidocaine with epinephrine. A timeout was performed prior to the initiation of the procedure. An incision was made overlying the Port-A-Cath with a scalpel. Utilizing sharp and blunt dissection, the Port-A-Cath was removed completely. The pocked was irrigated with sterile saline. Wound closure was performed with interrupted subcutaneous 2-0 Vicryl sutures then Dermabond was applied at the skin. Dressings were applied. The patient tolerated the procedures well without  immediate post procedural complication. IMPRESSION: 1. Successful placement of a LEFT chest internal jugular approach power injectable Port-A-Cath. The tip of the catheter  is positioned within the superior cavoatrial junction. The catheter is ready for immediate use. 2. Successful removal of implanted RIGHT chest Port-A-Cath without immediate post procedural complication. Michaelle Birks, MD [facility] Electronically Signed   By: Michaelle Birks M.D.   On: 01/15/2022 23:15   IR REMOVAL TUN ACCESS W/ PORT W/O FL MOD SED  Result Date: 01/15/2022 INDICATION: Port malfunction on recently placed port, 12/18/2021. Hub detachment. EXAM: Procedures: 1. IMPLANTED PORT A CATH PLACEMENT WITH ULTRASOUND AND FLUOROSCOPIC GUIDANCE 2. REMOVAL OF IMPLANTED TUNNELED PORT-A-CATH MEDICATIONS: Ancef 2 gm IV; The antibiotic was administered within an appropriate time interval prior to skin puncture. ANESTHESIA/SEDATION: Moderate (conscious) sedation was employed during this procedure. A total of Versed 2 mg and Fentanyl 100 mcg was administered intravenously. Moderate Sedation Time: 58 minutes. The patient's level of consciousness and vital signs were monitored continuously by radiology nursing throughout the procedure under my direct supervision. FLUOROSCOPY TIME:  Fluoroscopic dose; 2 mGy COMPLICATIONS: None immediate. PROCEDURE: The procedure, risks, benefits, and alternatives were explained to the patient and/or patient's representative . Questions regarding the procedure were encouraged and answered. The patient and/or patient's representative understands and consents to the procedure. PORT PLACEMENT; The LEFT neck and chest was prepped with chlorhexidine in a sterile fashion, and a sterile drape was applied covering the operative field. Maximum barrier sterile technique with sterile gowns and gloves were used for the procedure. A timeout was performed prior to the initiation  of the procedure. Local anesthesia was provided with 1% lidocaine with epinephrine. After creating a small venotomy incision, a micropuncture kit was utilized to access the internal jugular vein under direct, real-time ultrasound guidance. Ultrasound image documentation was performed. The microwire was kinked to measure appropriate catheter length. A subcutaneous port pocket was then created along the upper chest wall utilizing a combination of sharp and blunt dissection. The pocket was irrigated with sterile saline. A single lumen Non-ISP power injectable port was chosen for placement. The 8 Fr catheter was tunneled from the port pocket site to the venotomy incision. The port was placed in the pocket. The external catheter was trimmed to appropriate length. At the venotomy, an 8 Fr peel-away sheath was placed over a guidewire under fluoroscopic guidance. The catheter was then placed through the sheath and the sheath was removed. Final catheter positioning was confirmed and documented with a fluoroscopic spot radiograph. The port was accessed with a Huber needle, aspirated and flushed with heparinized saline. The port pocket incision was closed with interrupted 3-0 Vicryl suture then Dermabond was applied, including at the venotomy incision. Dressings were placed. PORT REMOVAL; Attention was then directed to the RIGHT chest and previously placed Port-A-Cath. The site had been prepped with chlorhexidine. Local anesthesia was provided with 1% lidocaine with epinephrine. A timeout was performed prior to the initiation of the procedure. An incision was made overlying the Port-A-Cath with a scalpel. Utilizing sharp and blunt dissection, the Port-A-Cath was removed completely. The pocked was irrigated with sterile saline. Wound closure was performed with interrupted subcutaneous 2-0 Vicryl sutures then Dermabond was applied at the skin. Dressings were applied. The patient tolerated the procedures well without immediate  post procedural complication. IMPRESSION: 1. Successful placement of a LEFT chest internal jugular approach power injectable Port-A-Cath. The tip of the catheter is positioned within the superior cavoatrial junction. The catheter is ready for immediate use. 2. Successful removal of implanted RIGHT chest Port-A-Cath without immediate post procedural complication. Michaelle Birks, MD [facility] Electronically Signed   By: Michaelle Birks M.D.   On: 01/15/2022 23:15    My personal review of EKG: Rhythm NSR, Rate  77 /min, QTc 421 , no Acute ST changes   Assessment & Plan:    Principal Problem:   Syncope Active Problems:   Thrombocytopenia (HCC)   Anemia of chronic disease   Hypothyroidism   Essential hypertension   BPH (benign prostatic hyperplasia)   GERD   Anemia associated with chemotherapy   Laceration of skin of right hand   Laceration of skin of right forearm   Laceration of skin of left forearm   Syncope -This is most likely due to his anemia, weakness, deconditioning, and very poor oral intake over last 24 hours (related to his long day at [facility] for Port-A-Cath exchange, as well related to his vomiting -Check orthostasis -Continue with gentle hydration -Please see discussion below under anemia  Pancytopenia -Including leukopenia, anemia and thrombocytopenia, please see discussion below under thrombocytopenia and anemia  Thrombocytopenia -Is most likely related to his chemotherapy, as he received both Revlimid and Velcade last week, and both of them will contribute to thrombocytopenia. -I have discussed with Dr. Grayland Ormond, his current thrombocytopenia is related to chemotherapy, and given his bleeding from his skin laceration recommendation for irradiated platelet transfusions, 2 units platelets pheresed has been ordered by ED -Pete CBC after transfusion  Anemia related to chemotherapy and multiple  myeloma Macrocytic  anemia -He is with symptomatic anemia, certainly contributing to his syncope, so we will transfuse 1 unit irradiated PRBC -He is microcytic at 104, B12 level was checked last April low at 212, will start on oral supplement (will avoid IM supplements in the setting of thrombocytopenia)  Multiple myeloma -Management per Dr. Delton Coombes, he does appear to be scheduled for next infusion tomorrow, certainly this needs to be held with current hematologic findings  Laceration of skin of right hand, right forearm, left forearm -Bandaged by ED, left forearm required thrombin bandage due to bleeding, this should improve once thrombocytopenia improves - will request wound consult for wound care recommendation  Hypertension / CAD -Patient presents with syncope, currently blood pressure is soft, so we will hold all cardiac meds, including aspirin due to his anemia, and bleeding from his skin lacerations  Hypothyroidism  - Continue with Synthroid  GERD  - Continue with PPI  Hypernatremia  - Due to volume depletion, continue with normal saline   Failure to thrive Protein calorie malnutrition  -We will start on Ensure and consult nutritionist -Consult PT/OT  .   DVT Prophylaxis SCDs  AM Labs Ordered, also please review Full Orders  Family Communication: Admission, patients condition and plan of care including tests being ordered have been discussed with the patient  who indicate understanding and agree with the plan and Code Status.  Code Status cussed with the patient, he wished to be full code  Likely DC to home  Condition GUARDED  Consults called: Discussed with hematology on-call Dr. Grayland Ormond  Admission status: Observation  Time spent in minutes : 75 minutes   Phillips Climes M.D on 01/16/2022 at 9:11 PM   [facility] - Office  [phone]

## 2022-01-16 NOTE — ED Triage Notes (Signed)
Pt states he fell last night after passing out.  Pt states he has skin tears to left hand and forearm and right forearm.  Denies any pain.  Pt states he is on blood thinners.

## 2022-01-16 NOTE — ED Notes (Signed)
Spoke with wife regarding updates and plan of care.

## 2022-01-16 NOTE — ED Notes (Signed)
Quick clot applied to left upper lateral forearm and left hand.  Both were wrapped in gauze and coband. Bryson Corona Edd Fabian

## 2022-01-16 NOTE — ED Notes (Signed)
Patient has multiple skin tears to right arm and left arm. Cleanse with NS steri strips applied. Applied dressing

## 2022-01-16 NOTE — ED Notes (Signed)
PT ambulated in room with 1 person assist pt stated he was not dizzy but needed to hold onto something. Dale [name]

## 2022-01-17 ENCOUNTER — Ambulatory Visit: Payer: Medicare Other

## 2022-01-17 ENCOUNTER — Inpatient Hospital Stay: Payer: Medicare Other

## 2022-01-17 ENCOUNTER — Other Ambulatory Visit: Payer: Medicare Other

## 2022-01-17 ENCOUNTER — Ambulatory Visit: Payer: Medicare Other | Admitting: Physician Assistant

## 2022-01-17 DIAGNOSIS — N400 Enlarged prostate without lower urinary tract symptoms: Secondary | ICD-10-CM | POA: Diagnosis not present

## 2022-01-17 DIAGNOSIS — R55 Syncope and collapse: Secondary | ICD-10-CM | POA: Diagnosis not present

## 2022-01-17 DIAGNOSIS — T451X5A Adverse effect of antineoplastic and immunosuppressive drugs, initial encounter: Secondary | ICD-10-CM | POA: Diagnosis not present

## 2022-01-17 DIAGNOSIS — I10 Essential (primary) hypertension: Secondary | ICD-10-CM | POA: Diagnosis not present

## 2022-01-17 DIAGNOSIS — K219 Gastro-esophageal reflux disease without esophagitis: Secondary | ICD-10-CM | POA: Diagnosis not present

## 2022-01-17 DIAGNOSIS — D638 Anemia in other chronic diseases classified elsewhere: Secondary | ICD-10-CM

## 2022-01-17 DIAGNOSIS — C9000 Multiple myeloma not having achieved remission: Secondary | ICD-10-CM

## 2022-01-17 DIAGNOSIS — D6481 Anemia due to antineoplastic chemotherapy: Secondary | ICD-10-CM | POA: Diagnosis not present

## 2022-01-17 DIAGNOSIS — D696 Thrombocytopenia, unspecified: Secondary | ICD-10-CM | POA: Diagnosis not present

## 2022-01-17 DIAGNOSIS — E039 Hypothyroidism, unspecified: Secondary | ICD-10-CM | POA: Diagnosis not present

## 2022-01-17 LAB — CBC
HCT: 20.5 % — ABNORMAL LOW (ref 39.0–52.0)
Hemoglobin: 6.9 g/dL — CL (ref 13.0–17.0)
MCH: 35.2 pg — ABNORMAL HIGH (ref 26.0–34.0)
MCHC: 33.7 g/dL (ref 30.0–36.0)
MCV: 104.6 fL — ABNORMAL HIGH (ref 80.0–100.0)
Platelets: 60 10*3/uL — ABNORMAL LOW (ref 150–400)
RBC: 1.96 MIL/uL — ABNORMAL LOW (ref 4.22–5.81)
WBC: 1.5 10*3/uL — ABNORMAL LOW (ref 4.0–10.5)
nRBC: 0 % (ref 0.0–0.2)

## 2022-01-17 LAB — BASIC METABOLIC PANEL
Anion gap: 4 — ABNORMAL LOW (ref 5–15)
BUN: 21 mg/dL (ref 8–23)
CO2: 24 mmol/L (ref 22–32)
Calcium: 8.4 mg/dL — ABNORMAL LOW (ref 8.9–10.3)
Chloride: 104 mmol/L (ref 98–111)
Creatinine, Ser: 0.84 mg/dL (ref 0.61–1.24)
GFR, Estimated: >60 mL/min (ref >60)
Glucose, Bld: 95 mg/dL (ref 70–99)
Potassium: 3.7 mmol/L (ref 3.5–5.1)
Sodium: 132 mmol/L — ABNORMAL LOW (ref 135–145)

## 2022-01-17 LAB — HEMOGLOBIN AND HEMATOCRIT, BLOOD
HCT: 23.8 % — ABNORMAL LOW (ref 39.0–52.0)
Hemoglobin: 8.1 g/dL — ABNORMAL LOW (ref 13.0–17.0)

## 2022-01-17 MED ORDER — ADULT MULTIVITAMIN W/MINERALS CH
1.0000 | ORAL_TABLET | Freq: Every day | ORAL | Status: DC
  Administered 2022-01-17: 1 via ORAL
  Filled 2022-01-17: qty 1

## 2022-01-17 MED ORDER — CYANOCOBALAMIN 1000 MCG PO TABS: 1000.0000 ug | ORAL_TABLET | Freq: Every day | ORAL | 3 refills | Status: DC

## 2022-01-17 MED ORDER — ENSURE ENLIVE PO LIQD: 237.0000 mL | Freq: Three times a day (TID) | ORAL | Status: DC

## 2022-01-17 MED ORDER — ENSURE ENLIVE PO LIQD
237.0000 mL | Freq: Three times a day (TID) | ORAL | Status: DC
  Administered 2022-01-17: 237 mL via ORAL

## 2022-01-17 MED ORDER — ISOSORBIDE MONONITRATE ER 60 MG PO TB24: 60.0000 mg | ORAL_TABLET | Freq: Every day | ORAL | Status: DC

## 2022-01-17 MED ORDER — ACETAMINOPHEN 325 MG PO TABS: 650.0000 mg | ORAL_TABLET | Freq: Four times a day (QID) | ORAL | Status: DC | PRN

## 2022-01-17 NOTE — Progress Notes (Signed)
Initial Nutrition Assessment  DOCUMENTATION CODES:   Not applicable  INTERVENTION:   Ensure Enlive po TID, each supplement provides 350 kcal and 20 grams of protein.  Magic cup TID with meals, each supplement provides 290 kcal and 9 grams of protein.  MVI with minerals daily.  NUTRITION DIAGNOSIS:   Increased nutrient needs related to acute illness as evidenced by estimated needs.  GOAL:   Patient will meet greater than or equal to 90% of their needs  MONITOR:   PO intake, Supplement acceptance, Skin  REASON FOR ASSESSMENT:   Consult Assessment of nutrition requirement/status  ASSESSMENT:   88 yo male admitted with syncope, thrombocytopenia, pancytopenia, anemia (Hgb 7.6). PMH includes multiple myeloma on chemotherapy, chronic anemia, CAD, ITP, sarcoidosis, HLD, HTN, macular degeneration, hypothyroidism, GERD, CHF, skin cancer, asthma, glaucoma, Schatzki's ring.  RD working remotely; unable to reach patient by phone; unable to complete NFPE at this time. Weight history reviewed. No significant weight changes noted over the past 9 months. Patient is at increased nutrition risk, given BMI 19.3, current weight is 82% of ideal weight, and recent poor intake for port a cath placement yesterday. Unable to obtain enough information at this time for identification of malnutrition. Ensure Enlive/Plus has been ordered BID, unsure if patient is drinking. Currently on a regular diet, meal intakes not recorded.   Labs reviewed. Na 132 Medications reviewed and include vitamin B-12, Protonix.  NUTRITION - FOCUSED PHYSICAL EXAM:  Unable to complete  Diet Order:   Diet Order             Diet regular Room service appropriate? Yes; Fluid consistency: Thin  Diet effective now                   EDUCATION NEEDS:   Not appropriate for education at this time  Skin:  Skin Assessment: Reviewed RN Assessment (abrasions to arms & legs from fall at home)  Last BM:  9/13  Height:    Ht Readings from Last 1 Encounters:  01/16/22 _0  (1.727 m)    Weight:   Wt Readings from Last 1 Encounters:  01/16/22 57.6 kg    Ideal Body Weight:  70 kg  BMI:  Body mass index is 19.31 kg/m.  Estimated Nutritional Needs:   Kcal:  2000-2200  Protein:  90-110 gm  Fluid:  >/= 2 L   Lucas Mallow RD, LDN, CNSC Please refer to Amion for contact information.

## 2022-01-17 NOTE — Care Management Obs Status (Signed)
[facility] NOTIFICATION   Patient Details  Name: Dale [name] MRN: [id number] Date of Birth: 12/08/1928   Medicare Observation Status Notification Given:  Yes    [name] 01/17/2022, 3:58 PM

## 2022-01-17 NOTE — Consult Note (Addendum)
[facility] Nurse Consult Note: Reason for Consult: Consult requested for skin tears to arms related to a fall prior to admission.  Performed remotely after review of progress notes and photos in the EMR. Wound type: Full thickness linear abrasions with steri strips in place to attempt to approximate skin over the affected areas. Mod amt bloody drainage. Dressing procedure/placement/frequency: Topical treatment orders provided for bedside nurses to perform as follows to protect from further injury and promote healing: Leave steri strips in place over skin tears until they fall off.  Apply double-folded Xeroform gauze over skin tears/steri strip areas Q day, and cover ABD pads and kerlex. Change ABD pads and kerlex PRN soiling.) Please re-consult if further assistance is needed.  Thank-you,  Julien Girt MSN, [place], [place], [place], [place]

## 2022-01-17 NOTE — Discharge Summary (Signed)
Physician Discharge Summary   Patient: Dale [name] MRN: [id number] DOB: 01/27/1929  Admit date:     01/16/2022  Discharge date: 01/17/22  Discharge Physician: Dale [name]   PCP: Dale School, MD   Recommendations at discharge:  Repeat CBC to follow hemoglobin/platelets trend/stability Repeat BMET to follow electrolytes and renal function. Reassess blood pressure and adjust antihypertensive regimen as needed.  Discharge Diagnoses: Principal Problem:   Syncope Active Problems:   Thrombocytopenia (HCC)   Anemia of chronic disease   Hypothyroidism   Essential hypertension   BPH (benign prostatic hyperplasia)   GERD   Anemia associated with chemotherapy   Laceration of skin of right hand   Laceration of skin of right forearm   Laceration of skin of left forearm  Brief Hospital admission course: As per H&P written by Dale [name] on 01/16/2022  Rashun [name]  is a 88 y.o. male, with past medical history of multiple myeloma, on chemotherapy Revlimid and Velcade, most recent 9/7, chronic anemia, microcytic anemia, ITP, CAD status post remote stenting, hypertension, BPH, hyperlipidemia. -Patient presents to ED secondary to syncope, patient had malfunction in his right upper chest Port-A-Cath, he spent all day yesterday at [facility] long hospital, for Port-A-Cath exchange, right Port-A-Cath was removed, he had new left upper Port-A-Cath inserted, he was n.p.o. all day long for this procedure, upon he got home he was so tired and fatigued did not have much to eat, and he had a vomiting episode, this morning he had a syncope, patient report generalized weakness, fatigue and tired, he was walking to the kitchen, felt dizzy, lightheaded, fell on the floor, he denies any head trauma, but he had multiple upper extremity skin lacerations. -In ED patient had pan CT including CT head, cervical spine, chest/abdomen/pelvis, with no evidence of internal trauma, or head bleed, only significant for his  skin laceration, work-up was significant for anemia with hemoglobin of 7.6, platelet count was significantly low at 29K, sodium was low at 129, white blood cell count was low at 1.5K, Dale [name] consulted to admit.  Assessment and Plan: Syncope -In the setting of symptomatic anemia, decreased oral intake and continued use of diuretics/antihypertensive agents -Medications has been adjusted -Patient advised to maintain adequate nutritional/hydration -Discharge home with home health PT -Blood transfusion and platelet transfusion provided during hospitalization -At discharge hemoglobin 8.1.  Pancytopenia -Including leukopenia, anemia, thrombocytopenia -All associated with acute on chronic disease along with the use of chemotherapy management. -Patient with underlying history of multiple myeloma and macrocytic anemia -Case discussed with oncologist on-call with recommendation for blood and platelet transfusion -Patient has been started on B12 oral repletion -Continue outpatient follow-up with oncology service.  Multiple myeloma -Continue outpatient follow-up with Dale [name] -Patient was scheduled for chemotherapy infusion on 01/17/2022; unfortunately service canceled due to hospitalization and need for acute transfusion. -Patient will contact office for further follow-up and treatment decisions.  Skin lacerations on his right hand, right forearm and left forearm after falling at home. -No signs of superimposed infection -Continue wound care as instructed -Keep area clean and dry.  Hypothyroidism -Continue Synthroid  GERD -Continue PPI  Hypernatremia -In the setting of dehydration and volume depletion -Improved after fluid resuscitation -Electrolytes stable at discharge -Repeat BMP in a follow-up visit.  Consultants: Oncology service Procedures performed: See below for x-ray reports Disposition: Home with home health services. Diet recommendation: Heart  healthy/low-sodium diet.  DISCHARGE MEDICATION: Allergies as of 01/17/2022       Reactions   Azithromycin Other (See  Comments)   Sore mouth and fever blisters around mouth, sores in nose area as well   Doxazosin Shortness Of Breath   Atenolol Other (See Comments)   UNKNOWN REACTION   Hydrocodone Nausea And Vomiting   Levofloxacin Other (See Comments)   Caused stomach problems.   Morphine Other (See Comments)   "made me crazy"   Penicillins Nausea And Vomiting, Other (See Comments)   Has patient had a PCN reaction causing immediate rash, facial/tongue/throat swelling, SOB or lightheadedness with hypotension: No Has patient had a PCN reaction causing severe rash involving mucus membranes or skin necrosis: No Has patient had a PCN reaction that required hospitalization No Has patient had a PCN reaction occurring within the last 10 years: No If all of the above answers are "NO", then may proceed with Cephalosporin use.   Sulfonamide Derivatives Nausea And Vomiting        Medication List     STOP taking these medications    aspirin EC 81 MG tablet   clindamycin 300 MG capsule Commonly known as: CLEOCIN   nabumetone 500 MG tablet Commonly known as: RELAFEN       TAKE these medications    acetaminophen 325 MG tablet Commonly known as: TYLENOL Take 2 tablets (650 mg total) by mouth every 6 (six) hours as needed for mild pain (or Fever >/= 101).   acyclovir 400 MG tablet Commonly known as: ZOVIRAX Take 1 tablet (400 mg total) by mouth 2 (two) times daily.   atorvastatin 40 MG tablet Commonly known as: LIPITOR TAKE 1 TABLET BY MOUTH IN  THE EVENING What changed: when to take this   brimonidine 0.2 % ophthalmic solution Commonly known as: ALPHAGAN Place 1 drop into the left eye 2 (two) times daily.   cyanocobalamin 1000 MCG tablet Take 1 tablet (1,000 mcg total) by mouth daily. Start taking on: January 18, 2022   dexamethasone 2 MG tablet Commonly known as:  DECADRON TAKE 5 TABLETS(10 MG) BY MOUTH 1 TIME A WEEK What changed:  how much to take how to take this when to take this additional instructions   dorzolamide-timolol 22.3-6.8 MG/ML ophthalmic solution Commonly known as: COSOPT Place 1 drop into the left eye 2 (two) times daily.   feeding supplement Liqd Take 237 mLs by mouth 3 (three) times daily between meals.   furosemide 20 MG tablet Commonly known as: LASIX If you notice any swelling, you may take 40 mg (two tablets) for the day. What changed:  how much to take how to take this when to take this additional instructions   isosorbide mononitrate 60 MG 24 hr tablet Commonly known as: IMDUR Take 1 tablet (60 mg total) by mouth daily. TAKE 1 AND 1/2 TABLETS BY  MOUTH IN THE MORNING AND  1/2 TABLET AT NIGHT What changed:  how much to take how to take this when to take this   levothyroxine 75 MCG tablet Commonly known as: SYNTHROID Take 75 mcg by mouth daily before breakfast.   lidocaine-prilocaine cream Commonly known as: EMLA Apply 1 Application topically as needed. What changed: reasons to take this   losartan 100 MG tablet Commonly known as: COZAAR TAKE 1 TABLET BY MOUTH  DAILY   metoprolol tartrate 25 MG tablet Commonly known as: LOPRESSOR TAKE 1 TABLET BY MOUTH IN  THE MORNING AND ONE-HALF  TABLET BY MOUTH IN THE  EVENING What changed:  how much to take how to take this when to take this   nitroGLYCERIN 0.4  MG SL tablet Commonly known as: NITROSTAT Place 1 tablet (0.4 mg total) under the tongue every 5 (five) minutes as needed. For chest pain   pantoprazole 40 MG tablet Commonly known as: PROTONIX TAKE 1 TABLET BY MOUTH  DAILY BEFORE BREAKFAST What changed:  how much to take how to take this when to take this additional instructions   potassium chloride SA 20 MEQ tablet Commonly known as: KLOR-CON M TAKE 1 TABLET BY MOUTH  DAILY   PreserVision/Lutein Caps Take 1 capsule by mouth 2 (two) times  daily.   prochlorperazine 10 MG tablet Commonly known as: COMPAZINE Take 1 tablet (10 mg total) by mouth every 6 (six) hours as needed for nausea or vomiting.   Rocklatan 0.02-0.005 % Soln Generic drug: Netarsudil-Latanoprost Place 1 drop into the left eye at bedtime.   triamcinolone cream 0.1 % Commonly known as: KENALOG Apply 1 application. topically 2 (two) times daily as needed (for irritation).   VELCADE IJ Inject as directed once a week.   VITAMIN C PO Take 500 mg by mouth every evening.   vitamin E 200 UNIT capsule Take 200 Units by mouth every evening.               Discharge Care Instructions  (From admission, onward)           Start     Ordered   01/17/22 0000  Discharge wound care:       Comments: Continue constant repositioning; keep area clean and dry.   01/17/22 1735            Follow-up Information     Dale School, MD. Schedule an appointment as soon as possible for a visit in 10 day(s).   Specialty: Internal Medicine Contact information: [address] [place] [address] [phone]         Troy Sine, MD .   Specialty: Cardiology Contact information: [address] [place] [address] [phone]                Discharge Exam: Danley Danker Weights   01/16/22 1052  Weight: 57.6 kg   General exam: Alert, awake, oriented x 3; reports no lightheadedness or near syncope symptoms.  Feeling ready to go home.  No chest pain, no shortness of breath, no nausea or vomiting.  Chronically ill in appearance and frail Respiratory system: Clear to auscultation. Respiratory effort normal.  Good saturation on room air. Cardiovascular system:RRR. No murmurs, rubs, gallops.  No JVD. Gastrointestinal system: Abdomen is nondistended, soft and nontender. No organomegaly or masses felt. Normal bowel sounds heard. Central nervous system: Alert and oriented. No focal neurological deficits. Extremities: No  cyanosis or clubbing. Skin: No petechiae; upper arms abrasions/lacerations without signs of superimposed infection appreciated.  Clean dressings in place. Psychiatry: Judgement and insight appear normal. Mood & affect appropriate.    Condition at discharge: Stable and improved.  The results of significant diagnostics from this hospitalization (including imaging, microbiology, ancillary and laboratory) are listed below for reference.   Imaging Studies: CT CHEST ABDOMEN PELVIS W CONTRAST  Result Date: 01/16/2022 CLINICAL DATA:  Trauma, fall EXAM: CT CHEST, ABDOMEN, AND PELVIS WITH CONTRAST TECHNIQUE: Multidetector CT imaging of the chest, abdomen and pelvis was performed following the standard protocol during bolus administration of intravenous contrast. RADIATION DOSE REDUCTION: This exam was performed according to the departmental dose-optimization program which includes automated exposure control, adjustment of the mA and/or kV according to patient size and/or use of iterative reconstruction  technique. CONTRAST:  92m OMNIPAQUE IOHEXOL 300 MG/ML  SOLN COMPARISON:  Previous studies including the CT abdomen done on 06/14/2021 FINDINGS: CT CHEST FINDINGS Cardiovascular: There is homogeneous enhancement in thoracic aorta. There are no intraluminal filling defects in central pulmonary artery branches. There is ectasia of the main pulmonary artery measuring 3.4 cm suggesting pulmonary arterial hypertension. Coronary artery calcifications are seen. Mediastinum/Nodes: There is no mediastinal hematoma. Left IJ chest port is noted with the tip of the catheter in superior vena cava. There are pockets of air in subcutaneous plane in the anterior chest, more so on the left side, possibly suggesting recent accessing of the chest port. There is a 2.4 cm loculated air containing structure in subcutaneous plane in the right anterior chest wall. Lungs/Pleura: Centrilobular and panlobular emphysema is seen. Increased  interstitial markings are seen in the posterior segment of right upper lobe and in posterior right lower lobe. There is ectasia bronchi in the area of increased interstitial markings. Findings may suggest scarring or interstitial pneumonia. There are linear densities in right middle lobe and lingula suggesting scarring or subsegmental atelectasis. Minimal left pleural effusion is seen. There is no pneumothorax. Musculoskeletal: No acute findings are seen in bony structures in the thorax. CT ABDOMEN PELVIS FINDINGS Hepatobiliary: There are few low-density lesions in liver largest measuring 3.4 cm in the left lobe, possibly cysts. There is no dilation of bile ducts. Gallbladder stones are seen. There is no wall thickening in gallbladder. Pancreas: No focal abnormalities are seen. Spleen: Unremarkable. Adrenals/Urinary Tract: Adrenals are unremarkable. There is no hydronephrosis. There is 2 mm calculus in the right kidney. There is 1 mm calculus in the lower pole of left kidney. There is focal cortical thinning in the lateral aspect of left kidney, possibly related to previous intervention or pyelonephritis. There are no ureteral stones. Urinary bladder is unremarkable. Stomach/Bowel: Stomach is unremarkable. Small bowel loops are not dilated. Appendix is not dilated. There is no significant wall thickening in colon. There is no pericardial stranding. Diverticula are seen in the colon without signs of focal acute diverticulitis. Vascular/Lymphatic: Scattered calcifications are seen in aorta and its major branches. No significant lymphadenopathy is seen. Reproductive: Unremarkable. Other: There is no ascites or pneumoperitoneum. Right inguinal hernia containing fat is seen. Musculoskeletal: No recent fracture is seen in bony structures. IMPRESSION: No acute findings are seen in CT scan of chest, abdomen and pelvis. There is no focal pulmonary consolidation. There is no pleural effusion or pneumothorax. There is no  evidence of mediastinal hematoma. Coronary artery disease. Ectasia of the main pulmonary artery suggests pulmonary arterial hypertension. There is no laceration in the solid organs. There is no bowel wall thickening. There is no ascites or pneumoperitoneum. No displaced fractures are seen in bony structures. There is loculated pocket of air in the right anterior chest wall, most likely suggesting recent removal of right chest port. There are pockets of air adjacent to left IJ chest port suggesting recent placement of left chest port. COPD. There are scattered foci of increased interstitial markings, especially in posterior segment of right upper lobe and superior segment of right lower lobe suggesting scarring. Hepatic cysts. Small bilateral renal stones. Diverticulosis of colon. Other findings as described in the body of the report. Electronically Signed   By: PElmer PickerM.D.   On: 01/16/2022 17:30   CT Head Wo Contrast  Result Date: 01/16/2022 CLINICAL DATA:  Fall, neck trauma EXAM: CT HEAD WITHOUT CONTRAST CT CERVICAL SPINE WITHOUT CONTRAST TECHNIQUE: Multidetector  CT imaging of the head and cervical spine was performed following the standard protocol without intravenous contrast. Multiplanar CT image reconstructions of the cervical spine were also generated. RADIATION DOSE REDUCTION: This exam was performed according to the departmental dose-optimization program which includes automated exposure control, adjustment of the mA and/or kV according to patient size and/or use of iterative reconstruction technique. COMPARISON:  CT head 01/04/2021 FINDINGS: CT HEAD FINDINGS Brain: Generalized atrophy without hydrocephalus. Patchy white matter hypodensity bilaterally unchanged. Negative for acute infarct, hemorrhage, mass Vascular: Negative for hyperdense vessel Skull: Negative calvarium Sinuses/Orbits: Expansile soft tissue mass in the left maxillary antrum is unchanged. This extends into the right nasal  cavity. No aggressive bony destruction. Other: None CT CERVICAL SPINE FINDINGS Alignment: Normal Skull base and vertebrae: Negative for fracture or mass Soft tissues and spinal canal: No soft tissue mass or adenopathy. Atherosclerotic calcification of the carotid bifurcation bilaterally left jugular vascular catheter with gas in the soft tissues likely due to recent placement. Disc levels: Mild disc degeneration. Moderate bilateral facet degeneration. Upper chest: Apical emphysema without acute abnormality Other: None IMPRESSION: 1. No acute intracranial injury. 2. Negative for cervical spine fracture 3. Expansile soft tissue mass in the left maxillary sinus extending into the left nasal cavity. This is unchanged from CT of 01/04/2021. 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 Electronically Signed   By: Franchot Gallo M.D.   On: 01/16/2022 17:17   CT Cervical Spine Wo Contrast  Result Date: 01/16/2022 CLINICAL DATA:  Fall, neck trauma EXAM: CT HEAD WITHOUT CONTRAST CT CERVICAL SPINE WITHOUT CONTRAST TECHNIQUE: Multidetector CT imaging of the head and cervical spine was performed following the standard protocol without intravenous contrast. Multiplanar CT image reconstructions of the cervical spine were also generated. RADIATION DOSE REDUCTION: This exam was performed according to the departmental dose-optimization program which includes automated exposure control, adjustment of the mA and/or kV according to patient size and/or use of iterative reconstruction technique. COMPARISON:  CT head 01/04/2021 FINDINGS: CT HEAD FINDINGS Brain: Generalized atrophy without hydrocephalus. Patchy white matter hypodensity bilaterally unchanged. Negative for acute infarct, hemorrhage, mass Vascular: Negative for hyperdense vessel Skull: Negative calvarium Sinuses/Orbits: Expansile soft tissue mass in the left maxillary antrum is unchanged. This extends into the right nasal cavity. No aggressive bony destruction. Other: None CT CERVICAL  SPINE FINDINGS Alignment: Normal Skull base and vertebrae: Negative for fracture or mass Soft tissues and spinal canal: No soft tissue mass or adenopathy. Atherosclerotic calcification of the carotid bifurcation bilaterally left jugular vascular catheter with gas in the soft tissues likely due to recent placement. Disc levels: Mild disc degeneration. Moderate bilateral facet degeneration. Upper chest: Apical emphysema without acute abnormality Other: None IMPRESSION: 1. No acute intracranial injury. 2. Negative for cervical spine fracture 3. Expansile soft tissue mass in the left maxillary sinus extending into the left nasal cavity. This is unchanged from CT of 01/04/2021. 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 Electronically Signed   By: Franchot Gallo M.D.   On: 01/16/2022 17:17   IR IMAGING GUIDED PORT INSERTION  Result Date: 01/15/2022 INDICATION: Port malfunction on recently placed port, 12/18/2021. Hub detachment. EXAM: Procedures: 1. IMPLANTED PORT A CATH PLACEMENT WITH ULTRASOUND AND FLUOROSCOPIC GUIDANCE 2. REMOVAL OF IMPLANTED TUNNELED PORT-A-CATH MEDICATIONS: Ancef 2 gm IV; The antibiotic was administered within an appropriate time interval prior to skin puncture. ANESTHESIA/SEDATION: Moderate (conscious) sedation was employed during this procedure. A total of Versed 2 mg and Fentanyl 100 mcg was administered intravenously. Moderate Sedation Time: 58 minutes. The patient's level of consciousness and  vital signs were monitored continuously by radiology nursing throughout the procedure under my direct supervision. FLUOROSCOPY TIME:  Fluoroscopic dose; 2 mGy COMPLICATIONS: None immediate. PROCEDURE: The procedure, risks, benefits, and alternatives were explained to the patient and/or patient's representative . Questions regarding the procedure were encouraged and answered. The patient and/or patient's representative understands and consents to the procedure. PORT PLACEMENT; The LEFT neck and chest was prepped with  chlorhexidine in a sterile fashion, and a sterile drape was applied covering the operative field. Maximum barrier sterile technique with sterile gowns and gloves were used for the procedure. A timeout was performed prior to the initiation of the procedure. Local anesthesia was provided with 1% lidocaine with epinephrine. After creating a small venotomy incision, a micropuncture kit was utilized to access the internal jugular vein under direct, real-time ultrasound guidance. Ultrasound image documentation was performed. The microwire was kinked to measure appropriate catheter length. A subcutaneous port pocket was then created along the upper chest wall utilizing a combination of sharp and blunt dissection. The pocket was irrigated with sterile saline. A single lumen Non-ISP power injectable port was chosen for placement. The 8 Fr catheter was tunneled from the port pocket site to the venotomy incision. The port was placed in the pocket. The external catheter was trimmed to appropriate length. At the venotomy, an 8 Fr peel-away sheath was placed over a guidewire under fluoroscopic guidance. The catheter was then placed through the sheath and the sheath was removed. Final catheter positioning was confirmed and documented with a fluoroscopic spot radiograph. The port was accessed with a Huber needle, aspirated and flushed with heparinized saline. The port pocket incision was closed with interrupted 3-0 Vicryl suture then Dermabond was applied, including at the venotomy incision. Dressings were placed. PORT REMOVAL; Attention was then directed to the RIGHT chest and previously placed Port-A-Cath. The site had been prepped with chlorhexidine. Local anesthesia was provided with 1% lidocaine with epinephrine. A timeout was performed prior to the initiation of the procedure. An incision was made overlying the Port-A-Cath with a scalpel. Utilizing sharp and blunt dissection, the Port-A-Cath was removed completely. The pocked  was irrigated with sterile saline. Wound closure was performed with interrupted subcutaneous 2-0 Vicryl sutures then Dermabond was applied at the skin. Dressings were applied. The patient tolerated the procedures well without immediate post procedural complication. IMPRESSION: 1. Successful placement of a LEFT chest internal jugular approach power injectable Port-A-Cath. The tip of the catheter is positioned within the superior cavoatrial junction. The catheter is ready for immediate use. 2. Successful removal of implanted RIGHT chest Port-A-Cath without immediate post procedural complication. Michaelle Birks, MD [facility] Electronically Signed   By: Michaelle Birks M.D.   On: 01/15/2022 23:15   IR REMOVAL TUN ACCESS W/ PORT W/O FL MOD SED  Result Date: 01/15/2022 INDICATION: Port malfunction on recently placed port, 12/18/2021. Hub detachment. EXAM: Procedures: 1. IMPLANTED PORT A CATH PLACEMENT WITH ULTRASOUND AND FLUOROSCOPIC GUIDANCE 2. REMOVAL OF IMPLANTED TUNNELED PORT-A-CATH MEDICATIONS: Ancef 2 gm IV; The antibiotic was administered within an appropriate time interval prior to skin puncture. ANESTHESIA/SEDATION: Moderate (conscious) sedation was employed during this procedure. A total of Versed 2 mg and Fentanyl 100 mcg was administered intravenously. Moderate Sedation Time: 58 minutes. The patient's level of consciousness and vital signs were monitored continuously by radiology nursing throughout the procedure under my direct supervision. FLUOROSCOPY TIME:  Fluoroscopic dose; 2 mGy COMPLICATIONS: None immediate. PROCEDURE: The procedure, risks, benefits, and alternatives  were explained to the patient and/or patient's representative . Questions regarding the procedure were encouraged and answered. The patient and/or patient's representative understands and consents to the procedure. PORT PLACEMENT; The LEFT neck and chest was prepped with  chlorhexidine in a sterile fashion, and a sterile drape was applied covering the operative field. Maximum barrier sterile technique with sterile gowns and gloves were used for the procedure. A timeout was performed prior to the initiation of the procedure. Local anesthesia was provided with 1% lidocaine with epinephrine. After creating a small venotomy incision, a micropuncture kit was utilized to access the internal jugular vein under direct, real-time ultrasound guidance. Ultrasound image documentation was performed. The microwire was kinked to measure appropriate catheter length. A subcutaneous port pocket was then created along the upper chest wall utilizing a combination of sharp and blunt dissection. The pocket was irrigated with sterile saline. A single lumen Non-ISP power injectable port was chosen for placement. The 8 Fr catheter was tunneled from the port pocket site to the venotomy incision. The port was placed in the pocket. The external catheter was trimmed to appropriate length. At the venotomy, an 8 Fr peel-away sheath was placed over a guidewire under fluoroscopic guidance. The catheter was then placed through the sheath and the sheath was removed. Final catheter positioning was confirmed and documented with a fluoroscopic spot radiograph. The port was accessed with a Huber needle, aspirated and flushed with heparinized saline. The port pocket incision was closed with interrupted 3-0 Vicryl suture then Dermabond was applied, including at the venotomy incision. Dressings were placed. PORT REMOVAL; Attention was then directed to the RIGHT chest and previously placed Port-A-Cath. The site had been prepped with chlorhexidine. Local anesthesia was provided with 1% lidocaine with epinephrine. A timeout was performed prior to the initiation of the procedure. An incision was made overlying the Port-A-Cath with a scalpel. Utilizing sharp and blunt dissection, the Port-A-Cath was removed completely. The pocked  was irrigated with sterile saline. Wound closure was performed with interrupted subcutaneous 2-0 Vicryl sutures then Dermabond was applied at the skin. Dressings were applied. The patient tolerated the procedures well without immediate post procedural complication. IMPRESSION: 1. Successful placement of a LEFT chest internal jugular approach power injectable Port-A-Cath. The tip of the catheter is positioned within the superior cavoatrial junction. The catheter is ready for immediate use. 2. Successful removal of implanted RIGHT chest Port-A-Cath without immediate post procedural complication. Michaelle Birks, MD [facility] Radiology Electronically Signed   By: Michaelle Birks M.D.   On: 01/15/2022 23:15   IR CV Line Injection  Result Date: 12/28/2021 INDICATION: Recent port placement. Malfunctioning with question of dislodgement. EXAM: FLUOROSCOPIC GUIDED PORT A CATHETER CHECK COMPARISON:  Chest XR, 12/24/2021.  IR fluoroscopy, 12/18/2021. MEDICATIONS: None. CONTRAST:  10 mL Omnipaque 300 FLUOROSCOPY TIME:  Fluoroscopic dose; 2 mGy COMPLICATIONS: None immediate. TECHNIQUE: The procedure, risks, benefits, and alternatives were explained to the patient and/or patient's representative and informed written consent was obtained. A timeout was performed prior to the initiation of the procedure. The patient's chest port a catheter was accessed by the IR RN. The patient was placed supine on the fluoroscopy table. A preprocedural spot fluoroscopic image was obtained of the chest in existing port a catheter. Contrast was injected via the Port a catheter and images were reviewed. The Port a catheter was flushed with a heparin dwell and de accessed. A dressing was placed. The patient tolerated the procedure well without immediate postprocedural complication.  FINDINGS: *RIGHT internal jugular vein approach port a catheter with tip projected over the expected location of the  superior cavoatrial junction. *Contrast injection demonstrated with patent catheter, without evidence of catheter kink or fracture. *The port hub, however, appears to have partially dis-attached since placement on 12/18/2021. See key image. *Patient with significant bruising and ecchymosis within the RIGHT chest subcutaneous tissues. No discrete contrast extravasation was appreciated. IMPRESSION: 1. Stable positioning of RIGHT internal jugular vein approach port catheter. 2. The port hub appears to have partially dis-attached since recent placement. See key image. Significant bruising and ecchymosis of the RIGHT chest, no discrete extravasation on port injection. PLAN: DO NOT USE RIGHT chest port catheter. The patient will be scheduled for and return to Vascular Interventional Radiology (VIR) for port removal and replacement. Michaelle Birks, MD [facility] Radiology Electronically Signed   By: Michaelle Birks M.D.   On: 12/28/2021 10:04   DG Chest 2 View  Result Date: 12/24/2021 CLINICAL DATA:  Possible Port-A-Cath dislodgement. Right chest edema. EXAM: CHEST - 2 VIEW COMPARISON:  Chest radiograph 07/26/2021, fluoroscopic spot view 12/18/2021 from Port-A-Cath placement. FINDINGS: There may be a few millimeter separation of the right chest port at the level of the of and catheter, which appears increased from placement fluoroscopic spot view. There is no catheter discontinuity, the tip is in the lower SVC. The heart is normal in size, stable mediastinal contours. Stable calcified granuloma in the medial left lung base. No pneumothorax, focal airspace disease or large pleural effusion. IMPRESSION: The right chest port catheter tip is in the lower SVC. There may be a few millimeters separation of the catheter and port connection. Electronically Signed   By: Keith Rake M.D.   On: 12/24/2021 18:53    Microbiology: Results for orders placed or performed during the  hospital encounter of 01/12/20  SARS Coronavirus 2 by RT PCR (hospital order, performed in [facility] hospital lab) Nasopharyngeal Nasopharyngeal Swab     Status: None   Collection Time: 01/12/20  1:19 PM   Specimen: Nasopharyngeal Swab  Result Value Ref Range Status   SARS Coronavirus 2 NEGATIVE NEGATIVE Final    Comment: (NOTE) SARS-CoV-2 target nucleic acids are NOT DETECTED.  The SARS-CoV-2 RNA is generally detectable in upper and lower respiratory specimens during the acute phase of infection. The lowest concentration of SARS-CoV-2 viral copies this assay can detect is 250 copies / mL. A negative result does not preclude SARS-CoV-2 infection and should not be used as the sole basis for treatment or other patient management decisions.  A negative result may occur with improper specimen collection / handling, submission of specimen other than nasopharyngeal swab, presence of viral mutation(s) within the areas targeted by this assay, and inadequate number of viral copies (<250 copies / mL). A negative result must be combined with clinical observations, patient history, and epidemiological information.  Fact Sheet for Patients:   StrictlyIdeas.no  Fact Sheet for Healthcare Providers: BankingDealers.co.za  This test is not yet approved or  cleared by the [place] FDA and has been authorized for detection and/or diagnosis of SARS-CoV-2 by FDA under an Emergency Use Authorization (EUA).  This EUA will remain in effect (meaning this test can be used) for the duration of the COVID-19 declaration under Section 564(b)(1) of the Act, 21 U.S.C. section 360bbb-3(b)(1), unless the authorization is terminated or revoked sooner.  Performed at [facility], [address], [place],  [address]     [name]: CBC:  Recent [name]  Lab 01/16/22 1435 01/17/22 0606 01/17/22 1630  WBC 1.5* 1.5*  --   NEUTROABS 0.8*  --   --   HGB 7.6* 6.9*  8.1*  HCT 23.7* 20.5* 23.8*  MCV 104.9* 104.6*  --   PLT 29* 60*  --    Basic Metabolic Panel: Recent [name]  Lab 01/16/22 1435 01/17/22 0606  NA 129* 132*  K 3.9 3.7  CL 100 104  CO2 22 24  GLUCOSE 104* 95  BUN 21 21  CREATININE 0.94 0.84  CALCIUM 8.6* 8.4*  MG 2.0  --     Discharge time spent: greater than 30 minutes.  Signed: Barton Dubois, MD [facility] 01/17/2022

## 2022-01-17 NOTE — Evaluation (Signed)
Physical Therapy Evaluation Patient Details Name: Dale [name] MRN: [id number] DOB: 1928/08/25 Today's Date: 01/17/2022  History of Present Illness  Dale [name]  is a 88 y.o. male, with past medical history of multiple myeloma, on chemotherapy Revlimid and Velcade, most recent 9/7, chronic anemia, microcytic anemia, ITP, CAD status post remote stenting, hypertension, BPH, hyperlipidemia.  -Patient presents to ED secondary to syncope, patient had malfunction in his right upper chest Port-A-Cath, he spent all day yesterday at [facility] hospital, for Port-A-Cath exchange, right Port-A-Cath was removed, he had new left upper Port-A-Cath inserted, he was n.p.o. all day long for this procedure, upon he got home he was so tired and fatigued did not have much to eat, and he had a vomiting episode, this morning he had a syncope, patient report generalized weakness, fatigue and tired, he was walking to the kitchen, felt dizzy, lightheaded, fell on the floor, he denies any head trauma, but he had multiple upper extremity skin lacerations.  -In ED patient had pan CT including CT head, cervical spine, chest/abdomen/pelvis, with no evidence of internal trauma, or head bleed, only significant for his skin laceration, work-up was significant for anemia with hemoglobin of 7.6, platelet count was significantly low at 29K, sodium was low at 129, white blood cell count was low at 1.5K, [facility] hospitalist consulted to admit.   Clinical Impression  Patient required increased time with slightly labored movement for sitting up at bedside, had to lean on armrest of chair during transfers without AD, safer using RW and demonstrated good return for ambulating in room/hallway without loss of balance.  Patient transferred to commode in bathroom to attempt BM after therapy with nursing staff present in room.  Patient will benefit from continued skilled physical therapy in hospital and recommended venue below to increase strength,  balance, endurance for safe ADLs and gait.        Recommendations for follow up therapy are one component of a multi-disciplinary discharge planning process, led by the attending physician.  Recommendations may be updated based on patient status, additional functional criteria and insurance authorization.  Follow Up Recommendations Home health PT      Assistance Recommended at Discharge Set up Supervision/Assistance  Patient can return home with the following  A little help with walking and/or transfers;A little help with bathing/dressing/bathroom;Help with stairs or ramp for entrance;Assistance with cooking/housework    Equipment Recommendations None recommended by PT  Recommendations for Other Services       Functional Status Assessment Patient has had a recent decline in their functional status and demonstrates the ability to make significant improvements in function in a reasonable and predictable amount of time.     Precautions / Restrictions Precautions Precautions: Fall Restrictions Weight Bearing Restrictions: No      Mobility  Bed Mobility Overal bed mobility: Needs Assistance Bed Mobility: Supine to Sit     Supine to sit: Supervision, Min guard     General bed mobility comments: slightly labored movement    Transfers Overall transfer level: Needs assistance Equipment used: Rolling walker (2 wheels) Transfers: Sit to/from Stand, Bed to chair/wheelchair/BSC Sit to Stand: Supervision   Step pivot transfers: Supervision       General transfer comment: increased time, labored movement    Ambulation/Gait Ambulation/Gait assistance: Supervision Gait Distance (Feet): 100 Feet Assistive device: Rolling walker (2 wheels) Gait Pattern/deviations: Decreased step length - right, Decreased step length - left, Decreased stride length Gait velocity: decreased     General Gait Details:  slightly labored cadence without loss of balance with good return for using  RW  Stairs            Wheelchair Mobility    Modified Rankin (Stroke Patients Only)       Balance Overall balance assessment: Needs assistance Sitting-balance support: Feet supported, No upper extremity supported Sitting balance-Leahy Scale: Good Sitting balance - Comments: seated at EOB   Standing balance support: During functional activity, No upper extremity supported Standing balance-Leahy Scale: Poor Standing balance comment: fair/good using RW                             Pertinent Vitals/Pain Pain Assessment Pain Assessment: No/denies pain    Home Living Family/patient expects to be discharged to:: Private residence Living Arrangements: Spouse/significant other Available Help at Discharge: Family;Available PRN/intermittently Type of Home: House Home Access: Stairs to enter;Ramped entrance Entrance Stairs-Rails: Right;Left;Can reach both Entrance Stairs-Number of Steps: 3   Home Layout: One level Home Equipment: Conservation officer, nature (2 wheels)      Prior Function Prior Level of Function : Independent/Modified Independent             Mobility Comments: household and short distanced community ambulator using RW ADLs Comments: Independent     Hand Dominance   Dominant Hand: Right    Extremity/Trunk Assessment   Upper Extremity Assessment Upper Extremity Assessment: Overall WFL for tasks assessed    Lower Extremity Assessment Lower Extremity Assessment: Generalized weakness    Cervical / Trunk Assessment Cervical / Trunk Assessment: Normal  Communication   Communication: HOH;No difficulties  Cognition Arousal/Alertness: Awake/alert Behavior During Therapy: WFL for tasks assessed/performed Overall Cognitive Status: Within Functional Limits for tasks assessed                                          General Comments      Exercises     Assessment/Plan    PT Assessment Patient needs continued PT services  PT  Problem List Decreased strength;Decreased activity tolerance;Decreased balance;Decreased mobility       PT Treatment Interventions DME instruction;Gait training;Stair training;Functional mobility training;Therapeutic activities;Therapeutic exercise;Patient/family education;Balance training    PT Goals (Current goals can be found in the Care Plan section)  Acute Rehab PT Goals Patient Stated Goal: return home PT Goal Formulation: With patient Time For Goal Achievement: 01/21/22 Potential to Achieve Goals: Good    Frequency Min 3X/week     Co-evaluation               AM-PAC PT "6 Clicks" Mobility  Outcome Measure Help needed turning from your back to your side while in a flat bed without using bedrails?: None Help needed moving from lying on your back to sitting on the side of a flat bed without using bedrails?: A Little Help needed moving to and from a bed to a chair (including a wheelchair)?: A Little Help needed standing up from a chair using your arms (e.g., wheelchair or bedside chair)?: A Little Help needed to walk in hospital room?: A Little Help needed climbing 3-5 steps with a railing? : A Little 6 Click Score: 19    End of Session   Activity Tolerance: Patient tolerated treatment well;Patient limited by fatigue Patient left: in chair;with call bell/phone within reach;with nursing/sitter in room Nurse Communication: Mobility status PT Visit Diagnosis: Unsteadiness on  feet (R26.81);Other abnormalities of gait and mobility (R26.89);Muscle weakness (generalized) (M62.81)    Time: 6433-2951 PT Time Calculation (min) (ACUTE ONLY): 26 min   Charges:   PT Evaluation $PT Eval Moderate Complexity: 1 Mod PT Treatments $Therapeutic Activity: 23-37 mins        2:39 PM, 01/17/22 Lonell Grandchild, MPT Physical Therapist with [facility] [phone] office [phone] mobile phone

## 2022-01-17 NOTE — Progress Notes (Signed)
[name] Discharge Note  Admit Date:  01/16/2022 Discharge date: 01/17/2022   Dale [name] to be D/C'd Home per MD order.  AVS completed. Patient/caregiver able to verbalize understanding.  Discharge Medication: Allergies as of 01/17/2022       Reactions   Azithromycin Other (See Comments)   Sore mouth and fever blisters around mouth, sores in nose area as well   Doxazosin Shortness Of Breath   Atenolol Other (See Comments)   UNKNOWN REACTION   Hydrocodone Nausea And Vomiting   Levofloxacin Other (See Comments)   Caused stomach problems.   Morphine Other (See Comments)   "made me crazy"   Penicillins Nausea And Vomiting, Other (See Comments)   Has patient had a PCN reaction causing immediate rash, facial/tongue/throat swelling, SOB or lightheadedness with hypotension: No Has patient had a PCN reaction causing severe rash involving mucus membranes or skin necrosis: No Has patient had a PCN reaction that required hospitalization No Has patient had a PCN reaction occurring within the last 10 years: No If all of the above answers are "NO", then may proceed with Cephalosporin use.   Sulfonamide Derivatives Nausea And Vomiting        Medication List     STOP taking these medications    aspirin EC 81 MG tablet   clindamycin 300 MG capsule Commonly known as: CLEOCIN   nabumetone 500 MG tablet Commonly known as: RELAFEN       TAKE these medications    acetaminophen 325 MG tablet Commonly known as: TYLENOL Take 2 tablets (650 mg total) by mouth every 6 (six) hours as needed for mild pain (or Fever >/= 101).   acyclovir 400 MG tablet Commonly known as: ZOVIRAX Take 1 tablet (400 mg total) by mouth 2 (two) times daily.   atorvastatin 40 MG tablet Commonly known as: LIPITOR TAKE 1 TABLET BY MOUTH IN  THE EVENING What changed: when to take this   brimonidine 0.2 % ophthalmic solution Commonly known as: ALPHAGAN Place 1 drop into the left eye 2 (two) times daily.    cyanocobalamin 1000 MCG tablet Take 1 tablet (1,000 mcg total) by mouth daily. Start taking on: January 18, 2022   dexamethasone 2 MG tablet Commonly known as: DECADRON TAKE 5 TABLETS(10 MG) BY MOUTH 1 TIME A WEEK What changed:  how much to take how to take this when to take this additional instructions   dorzolamide-timolol 22.3-6.8 MG/ML ophthalmic solution Commonly known as: COSOPT Place 1 drop into the left eye 2 (two) times daily.   feeding supplement Liqd Take 237 mLs by mouth 3 (three) times daily between meals.   furosemide 20 MG tablet Commonly known as: LASIX If you notice any swelling, you may take 40 mg (two tablets) for the day. What changed:  how much to take how to take this when to take this additional instructions   isosorbide mononitrate 60 MG 24 hr tablet Commonly known as: IMDUR Take 1 tablet (60 mg total) by mouth daily. TAKE 1 AND 1/2 TABLETS BY  MOUTH IN THE MORNING AND  1/2 TABLET AT NIGHT What changed:  how much to take how to take this when to take this   levothyroxine 75 MCG tablet Commonly known as: SYNTHROID Take 75 mcg by mouth daily before breakfast.   lidocaine-prilocaine cream Commonly known as: EMLA Apply 1 Application topically as needed. What changed: reasons to take this   losartan 100 MG tablet Commonly known as: COZAAR TAKE 1 TABLET BY MOUTH  DAILY   metoprolol tartrate 25 MG tablet Commonly known as: LOPRESSOR TAKE 1 TABLET BY MOUTH IN  THE MORNING AND ONE-HALF  TABLET BY MOUTH IN THE  EVENING What changed:  how much to take how to take this when to take this   nitroGLYCERIN 0.4 MG SL tablet Commonly known as: NITROSTAT Place 1 tablet (0.4 mg total) under the tongue every 5 (five) minutes as needed. For chest pain   pantoprazole 40 MG tablet Commonly known as: PROTONIX TAKE 1 TABLET BY MOUTH  DAILY BEFORE BREAKFAST What changed:  how much to take how to take this when to take this additional  instructions   potassium chloride SA 20 MEQ tablet Commonly known as: KLOR-CON M TAKE 1 TABLET BY MOUTH  DAILY   PreserVision/Lutein Caps Take 1 capsule by mouth 2 (two) times daily.   prochlorperazine 10 MG tablet Commonly known as: COMPAZINE Take 1 tablet (10 mg total) by mouth every 6 (six) hours as needed for nausea or vomiting.   Rocklatan 0.02-0.005 % Soln Generic drug: Netarsudil-Latanoprost Place 1 drop into the left eye at bedtime.   triamcinolone cream 0.1 % Commonly known as: KENALOG Apply 1 application. topically 2 (two) times daily as needed (for irritation).   VELCADE IJ Inject as directed once a week.   VITAMIN C PO Take 500 mg by mouth every evening.   vitamin E 200 UNIT capsule Take 200 Units by mouth every evening.               Discharge Care Instructions  (From admission, onward)           Start     Ordered   01/17/22 0000  Discharge wound care:       Comments: Continue constant repositioning; keep area clean and dry.   01/17/22 1735            Discharge Assessment: Vitals:   01/17/22 1044 01/17/22 1320  BP: (!) 113/49 135/64  Pulse: 63 (!) 58  Resp: 19 19  Temp: 97.8 F (36.6 C) 97.8 F (36.6 C)  SpO2: 100% 100%   Skin clean, dry and intact without evidence of skin break down, no evidence of skin tears noted. IV catheter discontinued intact. Site without signs and symptoms of complications - no redness or edema noted at insertion site, patient denies c/o pain - only slight tenderness at site.  Dressing with slight pressure applied.  D/c Instructions-Education: Discharge instructions given to patient/family with verbalized understanding. D/c education completed with patient/family including follow up instructions, medication list, d/c activities limitations if indicated, with other d/c instructions as indicated by MD - patient able to verbalize understanding, all questions fully answered. Patient instructed to return to ED,  call 911, or call MD for any changes in condition.  Patient escorted via [place], and D/C home via private auto.  Tsosie Billing, LPN 7/51/7001 7:49 PM

## 2022-01-17 NOTE — Progress Notes (Signed)
Admitted to 323 from ED via stretcher. Received platelets transfusion x2 bags, pt tolerated infusion with no difficulty. Bed in lowest position. Call bell in reach.

## 2022-01-17 NOTE — Progress Notes (Signed)
Date and time results received: 01/17/22 0715 (use smartphrase ".now" to insert current time)  Test: Hgb Critical Value: 6.9  Name of Provider Notified: [place]  Orders Received? Or Actions Taken?: awaiting response

## 2022-01-17 NOTE — TOC Initial Note (Signed)
Transition of Care [facility]) - Initial/Assessment Note    Patient Details  Name: Dale [name] MRN: [id number] Date of Birth: 1928-08-20  Transition of Care [facility]) CM/SW Contact:    Dale Gully, LCSW Phone Number: 01/17/2022, 3:15 PM  Clinical Narrative:                 Patient recommended for HHPT. Previously with [facility], would like them again. Patient and wife discussed living will and [facility] POA with TOC. They have documentation completed to complete provided by [facility]. TOC answered questions. Patient and spouse to discuss.   Expected Discharge Plan: [place] Barriers to Discharge: Continued Medical Work up   Patient Goals and CMS Choice Patient states their goals for this hospitalization and ongoing recovery are:: home with [facility]      Expected Discharge Plan and Services Expected Discharge Plan: [place]       Living arrangements for the past 2 months: Single Family Home                           HH Arranged: PT HH Agency: [place] ([place]) Date HH Agency Contacted: 01/17/22 Time [place]: [place] Representative spoke with at [place]: [place] Arrangements/Services Living arrangements for the past 2 months: [place] Lives with:: Spouse Patient language and need for interpreter reviewed:: Yes        Need for Family Participation in Patient Care: Yes (Comment) Care giver support system in place?: Yes (comment)   Criminal Activity/Legal Involvement Pertinent to Current Situation/Hospitalization: No - Comment as needed  Activities of Daily Living Home Assistive Devices/Equipment: None ADL Screening (condition at time of admission) Patient's cognitive ability adequate to safely complete daily activities?: Yes Is the patient deaf or have difficulty hearing?: No Does the patient have difficulty seeing, even when wearing glasses/contacts?: No Does the patient have difficulty concentrating,  remembering, or making decisions?: No Patient able to express need for assistance with ADLs?: Yes Does the patient have difficulty dressing or bathing?: No Independently performs ADLs?: Yes (appropriate for developmental age) Does the patient have difficulty walking or climbing stairs?: Yes Weakness of Legs: Both Weakness of Arms/Hands: None  Permission Sought/Granted Permission sought to share information with : Family Supports    Share Information with NAME: Dale [name] at bedside           Emotional Assessment     Affect (typically observed): Appropriate Orientation: : Oriented to Self, Oriented to Place, Oriented to Situation, Oriented to  Time Alcohol / Substance Use: Not Applicable Psych Involvement: No (comment)  Admission diagnosis:  Syncope [R55] Thrombocytopenia ([place]) [D69.6] Syncope, unspecified syncope type [R55] Patient Active Problem List   Diagnosis Date Noted   Syncope 01/16/2022   Anemia associated with chemotherapy 01/16/2022   Laceration of skin of right hand 01/16/2022   Laceration of skin of right forearm 01/16/2022   Laceration of skin of left forearm 01/16/2022   Protein-calorie malnutrition, severe ([place]) 07/29/2021   Hyponatremia 07/29/2021   Pancytopenia ([place]) 22/48/2500   Acute metabolic encephalopathy 37/08/8887   CAP (community acquired pneumonia) 07/27/2021   Multiple myeloma ([place]) 01/15/2021   Plasma cell dyscrasia 12/07/2020   Anemia of chronic disease 05/21/2017   Weight loss of more than 10% body weight 01/27/2017   Sinusitis 01/02/2017   Left renal mass 05/17/2016   Hiatal hernia    Schatzki's ring  Odynophagia 03/03/2015   Easy bruisability 09/26/2014   Hyperlipidemia LDL goal <70 06/28/2014   First degree AV block 10/08/2013   Atypical chest pain 02/02/2012   CAD, RCA stent, RCA new DES July 2009, Cath [place] 2011, 02/02/12 02/02/2012   Unstable angina, cath showed patent stents 02/02/12 02/02/2012   Thrombocytopenia ([place])  02/02/2012   BPH (benign prostatic hyperplasia) 02/02/2012   Chest pain 08/15/2011   Macular degeneration 08/15/2011   History of ITP 08/15/2011   Abdominal pain 06/28/2010   DIARRHEA 06/21/2010   GERD 01/19/2009   HEMATOCHEZIA 01/19/2009   DYSPHAGIA UNSPECIFIED 01/19/2009   Hypothyroidism 01/18/2009   HYPERCHOLESTEROLEMIA 01/18/2009   Unspecified glaucoma 01/18/2009   Essential hypertension 01/18/2009   CHF 01/18/2009   SCHATZKI'S RING 01/18/2009   ANAL FISSURE 01/18/2009   NEPHROLITHIASIS 01/18/2009   PUD, HX OF 01/18/2009   Sarcoidosis 08/27/2007   Coronary atherosclerosis 08/27/2007   Seasonal and perennial allergic rhinitis 08/27/2007   Allergic asthma, mild intermittent, uncomplicated 27/04/9289   PCP:  Redmond School, MD Pharmacy:   [facility] [place], [place] [address] [place] [address] Phone: [phone] Fax: [phone]  [facility] [phone] - [place], [place] AT [place] [address] [place] [address] Phone: [phone] Fax: [phone]  [facility] ([facility]) - [place], [address] [place] [address] Phone: [phone] Fax: [phone]     Social Determinants of Health (SDOH) Interventions    Readmission Risk Interventions    07/30/2021   10:16 AM  Readmission Risk Prevention Plan  Transportation Screening Complete  HRI or [place] Complete  Social Work Consult for [place] Planning/Counseling Complete  Palliative Care Screening Not Applicable  Medication Review Press photographer) Complete

## 2022-01-17 NOTE — Plan of Care (Signed)
  Problem: Acute Rehab PT Goals(only PT should resolve) Goal: Pt Will Go Sit To Supine/Side Outcome: Progressing Flowsheets (Taken 01/17/2022 1440) Pt will go Sit to Supine/Side: with modified independence Goal: Patient Will Transfer Sit To/From Stand Outcome: Progressing Flowsheets (Taken 01/17/2022 1440) Patient will transfer sit to/from stand:  with modified independence  with supervision Goal: Pt Will Transfer Bed To Chair/Chair To Bed Outcome: Progressing Flowsheets (Taken 01/17/2022 1440) Pt will Transfer Bed to Chair/Chair to Bed:  with modified independence  with supervision Goal: Pt Will Ambulate Outcome: Progressing Flowsheets (Taken 01/17/2022 1440) Pt will Ambulate:  > 125 feet  with modified independence  with supervision  with rolling walker   2:40 PM, 01/17/22 Lonell Grandchild, MPT Physical Therapist with [facility] [phone] office [phone] mobile phone

## 2022-01-18 LAB — PREPARE PLATELET PHERESIS
Unit division: 0
Unit division: 0

## 2022-01-18 LAB — BPAM PLATELET PHERESIS
Blood Product Expiration Date: 202309142359
Blood Product Expiration Date: 202309162359
ISSUE DATE / TIME: 202309132148
ISSUE DATE / TIME: 202309140034
Unit Type and Rh: 6200
Unit Type and Rh: 9500

## 2022-01-20 LAB — TYPE AND SCREEN
ABO/RH(D): O POS
Antibody Screen: POSITIVE
DAT, IgG: NEGATIVE
Unit division: 0
Unit division: 0
Unit division: 0

## 2022-01-20 LAB — BPAM RBC
Blood Product Expiration Date: 202310122359
Blood Product Expiration Date: 202310122359
Blood Product Expiration Date: 202310122359
ISSUE DATE / TIME: 202309141012
ISSUE DATE / TIME: 202309141012
Unit Type and Rh: 5100
Unit Type and Rh: 5100
Unit Type and Rh: 9500

## 2022-01-21 ENCOUNTER — Telehealth: Payer: Self-pay

## 2022-01-21 DIAGNOSIS — E46 Unspecified protein-calorie malnutrition: Secondary | ICD-10-CM | POA: Diagnosis not present

## 2022-01-21 DIAGNOSIS — I509 Heart failure, unspecified: Secondary | ICD-10-CM | POA: Diagnosis not present

## 2022-01-21 DIAGNOSIS — D509 Iron deficiency anemia, unspecified: Secondary | ICD-10-CM | POA: Diagnosis not present

## 2022-01-21 DIAGNOSIS — D693 Immune thrombocytopenic purpura: Secondary | ICD-10-CM | POA: Diagnosis not present

## 2022-01-21 DIAGNOSIS — D61818 Other pancytopenia: Secondary | ICD-10-CM | POA: Diagnosis not present

## 2022-01-21 DIAGNOSIS — H3530 Unspecified macular degeneration: Secondary | ICD-10-CM | POA: Diagnosis not present

## 2022-01-21 DIAGNOSIS — S51811D Laceration without foreign body of right forearm, subsequent encounter: Secondary | ICD-10-CM | POA: Diagnosis not present

## 2022-01-21 DIAGNOSIS — J45909 Unspecified asthma, uncomplicated: Secondary | ICD-10-CM | POA: Diagnosis not present

## 2022-01-21 DIAGNOSIS — C9000 Multiple myeloma not having achieved remission: Secondary | ICD-10-CM | POA: Diagnosis not present

## 2022-01-21 DIAGNOSIS — E039 Hypothyroidism, unspecified: Secondary | ICD-10-CM | POA: Diagnosis not present

## 2022-01-21 DIAGNOSIS — I2510 Atherosclerotic heart disease of native coronary artery without angina pectoris: Secondary | ICD-10-CM | POA: Diagnosis not present

## 2022-01-21 DIAGNOSIS — Z7952 Long term (current) use of systemic steroids: Secondary | ICD-10-CM | POA: Diagnosis not present

## 2022-01-21 DIAGNOSIS — Z9181 History of falling: Secondary | ICD-10-CM | POA: Diagnosis not present

## 2022-01-21 DIAGNOSIS — T451X5D Adverse effect of antineoplastic and immunosuppressive drugs, subsequent encounter: Secondary | ICD-10-CM | POA: Diagnosis not present

## 2022-01-21 DIAGNOSIS — I440 Atrioventricular block, first degree: Secondary | ICD-10-CM | POA: Diagnosis not present

## 2022-01-21 DIAGNOSIS — E785 Hyperlipidemia, unspecified: Secondary | ICD-10-CM | POA: Diagnosis not present

## 2022-01-21 DIAGNOSIS — Z87891 Personal history of nicotine dependence: Secondary | ICD-10-CM | POA: Diagnosis not present

## 2022-01-21 DIAGNOSIS — R627 Adult failure to thrive: Secondary | ICD-10-CM | POA: Diagnosis not present

## 2022-01-21 DIAGNOSIS — S51812D Laceration without foreign body of left forearm, subsequent encounter: Secondary | ICD-10-CM | POA: Diagnosis not present

## 2022-01-21 DIAGNOSIS — D6481 Anemia due to antineoplastic chemotherapy: Secondary | ICD-10-CM | POA: Diagnosis not present

## 2022-01-21 DIAGNOSIS — I110 Hypertensive heart disease with heart failure: Secondary | ICD-10-CM | POA: Diagnosis not present

## 2022-01-21 DIAGNOSIS — I10 Essential (primary) hypertension: Secondary | ICD-10-CM | POA: Diagnosis not present

## 2022-01-21 DIAGNOSIS — N400 Enlarged prostate without lower urinary tract symptoms: Secondary | ICD-10-CM | POA: Diagnosis not present

## 2022-01-21 DIAGNOSIS — D860 Sarcoidosis of lung: Secondary | ICD-10-CM | POA: Diagnosis not present

## 2022-01-21 DIAGNOSIS — H409 Unspecified glaucoma: Secondary | ICD-10-CM | POA: Diagnosis not present

## 2022-01-21 NOTE — Telephone Encounter (Signed)
Attempted to contact patient's son [name] to schedule a Palliative Care consult appointment. No answer left a message to return call.

## 2022-01-22 ENCOUNTER — Ambulatory Visit (INDEPENDENT_AMBULATORY_CARE_PROVIDER_SITE_OTHER): Payer: Medicare Other | Admitting: Orthopaedic Surgery

## 2022-01-22 ENCOUNTER — Encounter: Payer: Self-pay | Admitting: Orthopaedic Surgery

## 2022-01-22 ENCOUNTER — Telehealth: Payer: Self-pay | Admitting: Nurse Practitioner

## 2022-01-22 VITALS — BP 154/74 | HR 50 | Ht 68.0 in | Wt 127.0 lb

## 2022-01-22 DIAGNOSIS — I25119 Atherosclerotic heart disease of native coronary artery with unspecified angina pectoris: Secondary | ICD-10-CM

## 2022-01-22 DIAGNOSIS — I110 Hypertensive heart disease with heart failure: Secondary | ICD-10-CM | POA: Diagnosis not present

## 2022-01-22 DIAGNOSIS — M79661 Pain in right lower leg: Secondary | ICD-10-CM | POA: Diagnosis not present

## 2022-01-22 DIAGNOSIS — I509 Heart failure, unspecified: Secondary | ICD-10-CM | POA: Diagnosis not present

## 2022-01-22 DIAGNOSIS — L03115 Cellulitis of right lower limb: Secondary | ICD-10-CM | POA: Diagnosis not present

## 2022-01-22 DIAGNOSIS — D6481 Anemia due to antineoplastic chemotherapy: Secondary | ICD-10-CM | POA: Diagnosis not present

## 2022-01-22 DIAGNOSIS — C9000 Multiple myeloma not having achieved remission: Secondary | ICD-10-CM | POA: Diagnosis not present

## 2022-01-22 DIAGNOSIS — D509 Iron deficiency anemia, unspecified: Secondary | ICD-10-CM | POA: Diagnosis not present

## 2022-01-22 DIAGNOSIS — T451X5D Adverse effect of antineoplastic and immunosuppressive drugs, subsequent encounter: Secondary | ICD-10-CM | POA: Diagnosis not present

## 2022-01-22 NOTE — Progress Notes (Signed)
I am better.  His leg cellulitis on the right has resolved.  He has no redness now, no swelling.  He fell last week and hurt his right forearm and left hand.  He is in bandages.  He is being treated by another physician for this.  Encounter Diagnoses  Name Primary?   Cellulitis of right lower leg Yes   Pain in right lower leg    I will see him prn.  Call if any problem.  Precautions discussed.  Electronically Signed Sanjuana Kava, MD 9/19/202310:15 AM

## 2022-01-22 NOTE — Telephone Encounter (Signed)
Attempted to contact son [name], to schedule Palliative Consult, no answer - left VM requesting a return call.  I also attempted to contact patient and/or spouse at home number and line was busy.

## 2022-01-23 DIAGNOSIS — I110 Hypertensive heart disease with heart failure: Secondary | ICD-10-CM | POA: Diagnosis not present

## 2022-01-23 DIAGNOSIS — C9000 Multiple myeloma not having achieved remission: Secondary | ICD-10-CM | POA: Diagnosis not present

## 2022-01-23 DIAGNOSIS — D509 Iron deficiency anemia, unspecified: Secondary | ICD-10-CM | POA: Diagnosis not present

## 2022-01-23 DIAGNOSIS — T451X5D Adverse effect of antineoplastic and immunosuppressive drugs, subsequent encounter: Secondary | ICD-10-CM | POA: Diagnosis not present

## 2022-01-23 DIAGNOSIS — D6481 Anemia due to antineoplastic chemotherapy: Secondary | ICD-10-CM | POA: Diagnosis not present

## 2022-01-23 DIAGNOSIS — I509 Heart failure, unspecified: Secondary | ICD-10-CM | POA: Diagnosis not present

## 2022-01-24 ENCOUNTER — Inpatient Hospital Stay (HOSPITAL_BASED_OUTPATIENT_CLINIC_OR_DEPARTMENT_OTHER): Payer: Medicare Other | Admitting: Physician Assistant

## 2022-01-24 ENCOUNTER — Other Ambulatory Visit (HOSPITAL_COMMUNITY): Payer: Self-pay | Admitting: Hematology

## 2022-01-24 ENCOUNTER — Inpatient Hospital Stay: Payer: Medicare Other

## 2022-01-24 ENCOUNTER — Other Ambulatory Visit: Payer: Medicare Other

## 2022-01-24 ENCOUNTER — Inpatient Hospital Stay: Payer: Medicare Other | Admitting: Licensed Clinical Social Worker

## 2022-01-24 ENCOUNTER — Ambulatory Visit: Payer: Medicare Other

## 2022-01-24 ENCOUNTER — Ambulatory Visit: Payer: Medicare Other | Admitting: Physician Assistant

## 2022-01-24 VITALS — BP 124/46 | HR 67 | Temp 97.1°F | Resp 18 | Ht 68.0 in | Wt 121.1 lb

## 2022-01-24 DIAGNOSIS — C9000 Multiple myeloma not having achieved remission: Secondary | ICD-10-CM

## 2022-01-24 DIAGNOSIS — D649 Anemia, unspecified: Secondary | ICD-10-CM

## 2022-01-24 DIAGNOSIS — D630 Anemia in neoplastic disease: Secondary | ICD-10-CM | POA: Diagnosis not present

## 2022-01-24 DIAGNOSIS — Z5112 Encounter for antineoplastic immunotherapy: Secondary | ICD-10-CM | POA: Diagnosis not present

## 2022-01-24 DIAGNOSIS — Z79899 Other long term (current) drug therapy: Secondary | ICD-10-CM | POA: Diagnosis not present

## 2022-01-24 LAB — CBC WITH DIFFERENTIAL/PLATELET
Abs Immature Granulocytes: 0.03 10*3/uL (ref 0.00–0.07)
Basophils Absolute: 0 10*3/uL (ref 0.0–0.1)
Basophils Relative: 0 %
Eosinophils Absolute: 0 10*3/uL (ref 0.0–0.5)
Eosinophils Relative: 1 %
HCT: 20.1 % — ABNORMAL LOW (ref 39.0–52.0)
Hemoglobin: 6.5 g/dL — CL (ref 13.0–17.0)
Immature Granulocytes: 1 %
Lymphocytes Relative: 26 %
Lymphs Abs: 0.8 10*3/uL (ref 0.7–4.0)
MCH: 33 pg (ref 26.0–34.0)
MCHC: 32.3 g/dL (ref 30.0–36.0)
MCV: 102 fL — ABNORMAL HIGH (ref 80.0–100.0)
Monocytes Absolute: 0.6 10*3/uL (ref 0.1–1.0)
Monocytes Relative: 20 %
Neutro Abs: 1.6 10*3/uL — ABNORMAL LOW (ref 1.7–7.7)
Neutrophils Relative %: 52 %
Platelets: 51 10*3/uL — ABNORMAL LOW (ref 150–400)
RBC: 1.97 MIL/uL — ABNORMAL LOW (ref 4.22–5.81)
RDW: 25.8 % — ABNORMAL HIGH (ref 11.5–15.5)
WBC: 3 10*3/uL — ABNORMAL LOW (ref 4.0–10.5)
nRBC: 0 % (ref 0.0–0.2)

## 2022-01-24 LAB — COMPREHENSIVE METABOLIC PANEL
ALT: 22 U/L (ref 0–44)
AST: 17 U/L (ref 15–41)
Albumin: 2.5 g/dL — ABNORMAL LOW (ref 3.5–5.0)
Alkaline Phosphatase: 49 U/L (ref 38–126)
Anion gap: 5 (ref 5–15)
BUN: 28 mg/dL — ABNORMAL HIGH (ref 8–23)
CO2: 23 mmol/L (ref 22–32)
Calcium: 8.4 mg/dL — ABNORMAL LOW (ref 8.9–10.3)
Chloride: 102 mmol/L (ref 98–111)
Creatinine, Ser: 0.85 mg/dL (ref 0.61–1.24)
GFR, Estimated: >60 mL/min (ref >60)
Glucose, Bld: 99 mg/dL (ref 70–99)
Potassium: 3.9 mmol/L (ref 3.5–5.1)
Sodium: 130 mmol/L — ABNORMAL LOW (ref 135–145)
Total Bilirubin: 0.9 mg/dL (ref 0.3–1.2)
Total Protein: 8.2 g/dL — ABNORMAL HIGH (ref 6.5–8.1)

## 2022-01-24 LAB — PREPARE RBC (CROSSMATCH)

## 2022-01-24 LAB — SAMPLE TO BLOOD BANK

## 2022-01-24 LAB — FERRITIN: Ferritin: 1415 ng/mL — ABNORMAL HIGH (ref 24–336)

## 2022-01-24 LAB — MAGNESIUM: Magnesium: 2 mg/dL (ref 1.7–2.4)

## 2022-01-24 MED ORDER — ACETAMINOPHEN 325 MG PO TABS
650.0000 mg | ORAL_TABLET | Freq: Once | ORAL | Status: AC
  Administered 2022-01-24: 650 mg via ORAL
  Filled 2022-01-24: qty 2

## 2022-01-24 MED ORDER — SODIUM CHLORIDE 0.9% IV SOLUTION
250.0000 mL | Freq: Once | INTRAVENOUS | Status: AC
  Administered 2022-01-24: 250 mL via INTRAVENOUS

## 2022-01-24 MED ORDER — SODIUM CHLORIDE 0.9% FLUSH
10.0000 mL | INTRAVENOUS | Status: AC | PRN
  Administered 2022-01-24: 10 mL

## 2022-01-24 MED ORDER — HEPARIN SOD (PORK) LOCK FLUSH 100 UNIT/ML IV SOLN
500.0000 [IU] | Freq: Every day | INTRAVENOUS | Status: AC | PRN
  Administered 2022-01-24: 500 [IU]

## 2022-01-24 MED ORDER — DIPHENHYDRAMINE HCL 25 MG PO CAPS
25.0000 mg | ORAL_CAPSULE | Freq: Once | ORAL | Status: AC
  Administered 2022-01-24: 25 mg via ORAL
  Filled 2022-01-24: qty 1

## 2022-01-24 NOTE — Progress Notes (Signed)
Dale [name], Dale [name] [id number]   CLINIC:  Medical Oncology/Hematology  PCP:  [facility], [place] / [place] [address] [phone]   REASON FOR VISIT:  Follow-up for multiple myeloma and anemia  PRIOR THERAPY: none  CURRENT THERAPY: Weekly Velcade and dexamethasone  BRIEF ONCOLOGIC HISTORY:  Oncology History  Multiple myeloma ([place])  01/15/2021 Initial Diagnosis   Multiple myeloma ([place])   06/25/2021 - 12/06/2021 Chemotherapy   Patient is on Treatment Plan : MYELOMA NON-TRANSPLANT CANDIDATES VRd weekly q21d     12/20/2021 -  Chemotherapy   Patient is on Treatment Plan : MYELOMA Daratumumab IV q28d       CANCER STAGING:  Cancer Staging  No matching staging information was found for the patient.  INTERVAL HISTORY:  Dale [name], a 88 y.o. male, returns for follow-up of multiple myeloma.  He was last seen by Dr. Delton Coombes on 01/10/2022. In the interim, he was hospitalized from 01/16/2022-01/17/2022 due to syncope, thrombocytopenia and symptomatic anemia. He was given fluids, blood and platelet transfusion. He was discharged with Hgb of 8.1.   Dale [name] presents today with his son. He reports still feeling quite fatigued and requires assistance with his daily activities. He still feels dizzy but denies any repeat syncopal episodes since hospital discharge. He is eating better and gaining weight back. He denies nausea, vomiting or abdominal pain. He reports having loose stools and the stools are dark/black in color for the last 2 weeks. He denies any hematochezia or other signs of bleeding. He reports shortness of breath but no chest pain. He denies fevers, chills, sweats, cough, peripheral neuropathy or edema. He has no other complaints.  REVIEW OF SYSTEMS:  Review of Systems  Constitutional:  Positive for fatigue.  Respiratory:  Positive for shortness of breath. Negative for cough.   Cardiovascular:  Negative for chest  pain.  Gastrointestinal:  Negative for abdominal pain.       Reports loose stools and dark/black stools     Musculoskeletal:  Positive for arthralgias (6/10 L shoulder + L hip).  Neurological:  Positive for dizziness. Negative for headaches and numbness.  All other systems reviewed and are negative.   PAST MEDICAL/SURGICAL HISTORY:  Past Medical History:  Diagnosis Date   Allergic rhinitis    Anal fissure    Anginal pain (HCC)    Asthma    Cancer (HCC)    skin   Chest pain    CHF (congestive heart failure) ([place])    Coronary heart disease    s/p stenting. cath in 01/2012 noncritical occlusion   Dyspnea    Dysrhythmia    1st degree heart block   GERD (gastroesophageal reflux disease)    Glaucoma    Hiatal hernia    Hyperlipidemia    Hypertension    Hypothyroidism    Idiopathic thrombocytopenic purpura ([place]) 2002   Macular degeneration    Nephrolithiasis    PUD (peptic ulcer disease)    remote   Sarcoidosis    pulmonary   Schatzki's ring    Past Surgical History:  Procedure Laterality Date   cardiac stents     COLONOSCOPY  10/30/2006   Normal rectum, sigmoid diverticula.Remainder of colonic mucosa appeared normal.   CORONARY ANGIOPLASTY WITH STENT PLACEMENT     about 10 years ago per pt (around 2007)   [place], URETEROSCOPY AND STENT PLACEMENT Left 06/16/2017   Procedure: CYSTOSCOPY WITH RETROGRADE PYELOGRAM, URETEROSCOPY,STONE EXTRACTION  AND STENT PLACEMENT;  Surgeon: Franchot Gallo, MD;  Location: [facility];  Service: Urology;  Laterality: Left;   ESOPHAGOGASTRODUODENOSCOPY  06/19/2004   Two esophageal rings and esophageal web as described above.  All of these were disrupted by passing 56-French Venia Minks dilator/ Candida esophagitis,which appears to be incidental given history of   antibiotic use, but nevertheless will be treated.   ESOPHAGOGASTRODUODENOSCOPY  10/30/2006   Distal tandem esophageal ring status post dilation disruption as   described above.  Otherwise normal esophagus/  Small hiatal hernia otherwise normal stomach, D1 and D2   ESOPHAGOGASTRODUODENOSCOPY N/A 03/22/2015   Dr.Rourk- noncritical schatzki's ring and hiatal hernia-o/w normal EGD.    ESOPHAGOGASTRODUODENOSCOPY (EGD) WITH ESOPHAGEAL DILATION  03/04/2012   RMR- schatzki's ring, hiatal hernia, polypoid gastric mucosa, bx= minimally active gastritis.   HOLMIUM LASER APPLICATION Left 7/32/2025   Procedure: HOLMIUM LASER APPLICATION;  Surgeon: Franchot Gallo, MD;  Location: [facility];  Service: Urology;  Laterality: Left;   IR CV LINE INJECTION  12/27/2021   IR GENERIC HISTORICAL  03/06/2016   IR RADIOLOGIST EVAL & MGMT 03/06/2016 Aletta Edouard, MD [facility] INTER[facility] RAD   IR GENERIC HISTORICAL  06/18/2016   IR RADIOLOGIST EVAL & MGMT 06/18/2016 Aletta Edouard, MD [facility] INTER[facility] RAD   IR IMAGING GUIDED PORT INSERTION  12/18/2021   IR IMAGING GUIDED PORT INSERTION  01/15/2022   IR RADIOLOGIST EVAL & MGMT  10/01/2016   IR RADIOLOGIST EVAL & MGMT  10/15/2017   IR RADIOLOGIST EVAL & MGMT  12/24/2018   IR RADIOLOGIST EVAL & MGMT  01/04/2020   IR RADIOLOGIST EVAL & MGMT  06/20/2021   IR REMOVAL TUN ACCESS W/ PORT W/O FL MOD SED  01/15/2022   LEFT HEART CATH N/A 02/02/2012   Procedure: LEFT HEART CATH;  Surgeon: Lorretta Harp, MD;  Location: [facility] CATH LAB;  Service: Cardiovascular;  Laterality: N/A;   MEDIASTINOSCOPY     for dx sarcoid   RADIOLOGY WITH ANESTHESIA Left 05/17/2016   Procedure: left renal ablation;  Surgeon: Aletta Edouard, MD;  Location: [facility];  Service: Radiology;  Laterality: Left;    SOCIAL HISTORY:  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    Employer: RETIRED  Tobacco Use   Smoking status: Former    Packs/day: 0.10     Years: 2.00    Total pack years: 0.20    Types: Cigarettes, Cigars    Quit date: 05/06/1970    Years since quitting: 51.7   Smokeless tobacco: Never  Vaping Use   Vaping Use: Never used  Substance and Sexual Activity   Alcohol use: No    Alcohol/week: 0.0 standard drinks of alcohol   Drug use: No   Sexual activity: Never  Other Topics Concern   Not on file  Social History Narrative   Not on file   Social Determinants of Health   Financial Resource Strain: Not on file  Food Insecurity: No Food Insecurity (01/17/2022)   Hunger Vital Sign    Worried About Running Out of Food in the Last Year: Never true    [place] in the Last Year: Never true  Transportation Needs: No Transportation Needs (01/17/2022)   [place] - Transportation  Lack of Transportation (Medical): No    Lack of Transportation (Non-Medical): No  Physical Activity: Not on file  Stress: Not on file  Social Connections: Not on file  Intimate Partner Violence: Not At Risk (01/17/2022)   Humiliation, Afraid, Rape, and Kick questionnaire    Fear of Current or Ex-Partner: No    Emotionally Abused: No    Physically Abused: No    Sexually Abused: No    FAMILY HISTORY:  Family History  Problem Relation Age of Onset   Heart disease Father        deceased age 7   Stroke Mother    Alzheimer's disease Mother    Heart attack Brother        deceased age 71   Cancer Other        niece   Colon cancer Neg Hx     CURRENT MEDICATIONS:  Current Outpatient Medications  Medication Sig Dispense Refill   acetaminophen (TYLENOL) 325 MG tablet Take 2 tablets (650 mg total) by mouth every 6 (six) hours as needed for mild pain (or Fever >/= 101).     acyclovir (ZOVIRAX) 400 MG tablet Take 1 tablet (400 mg total) by mouth 2 (two) times daily. 180 tablet 0   Ascorbic Acid (VITAMIN C PO) Take 500 mg by mouth every evening.     atorvastatin (LIPITOR) 40 MG tablet TAKE 1 TABLET BY MOUTH IN  THE EVENING (Patient taking  differently: Take 40 mg by mouth daily.) 90 tablet 3   Bortezomib (VELCADE IJ) Inject as directed once a week.     brimonidine (ALPHAGAN) 0.2 % ophthalmic solution Place 1 drop into the left eye 2 (two) times daily.     cyanocobalamin 1000 MCG tablet Take 1 tablet (1,000 mcg total) by mouth daily. 30 tablet 3   dexamethasone (DECADRON) 2 MG tablet TAKE 5 TABLETS(10 MG) BY MOUTH 1 TIME A WEEK (Patient taking differently: Take 10 mg by mouth once a week.) 60 tablet 0   dorzolamide-timolol (COSOPT) 22.3-6.8 MG/ML ophthalmic solution Place 1 drop into the left eye 2 (two) times daily.     feeding supplement (ENSURE ENLIVE / ENSURE PLUS) LIQD Take 237 mLs by mouth 3 (three) times daily between meals.     furosemide (LASIX) 20 MG tablet If you notice any swelling, you may take 40 mg (two tablets) for the day. (Patient taking differently: Take 20 mg by mouth 2 (two) times daily.) 90 tablet 3   isosorbide mononitrate (IMDUR) 60 MG 24 hr tablet Take 1 tablet (60 mg total) by mouth daily. TAKE 1 AND 1/2 TABLETS BY  MOUTH IN THE MORNING AND  1/2 TABLET AT NIGHT     levothyroxine (SYNTHROID) 75 MCG tablet Take 75 mcg by mouth daily before breakfast.     losartan (COZAAR) 100 MG tablet TAKE 1 TABLET BY MOUTH  DAILY (Patient taking differently: Take 100 mg by mouth daily.) 90 tablet 1   metoprolol tartrate (LOPRESSOR) 25 MG tablet TAKE 1 TABLET BY MOUTH IN  THE MORNING AND ONE-HALF  TABLET BY MOUTH IN THE  EVENING (Patient taking differently: Take 12.5-25 mg by mouth in the morning and at bedtime. TAKE 1 TABLET BY MOUTH IN  THE MORNING AND ONE-HALF  TABLET BY MOUTH IN THE  EVENING) 135 tablet 3   Multiple Vitamins-Minerals (PRESERVISION/LUTEIN) CAPS Take 1 capsule by mouth 2 (two) times daily.     pantoprazole (PROTONIX) 40 MG tablet TAKE 1 TABLET BY MOUTH  DAILY BEFORE BREAKFAST (Patient  taking differently: Take 40 mg by mouth daily.) 90 tablet 1   potassium chloride SA (KLOR-CON M) 20 MEQ tablet TAKE 1 TABLET BY  MOUTH  DAILY (Patient taking differently: Take 20 mEq by mouth daily.) 90 tablet 3   ROCKLATAN 0.02-0.005 % SOLN Place 1 drop into the left eye at bedtime.     triamcinolone (KENALOG) 0.1 % cream Apply 1 application. topically 2 (two) times daily as needed (for irritation).     vitamin E 200 UNIT capsule Take 200 Units by mouth every evening.     lidocaine-prilocaine (EMLA) cream Apply 1 Application topically as needed. (Patient not taking: Reported on 01/24/2022) 30 g 1   nitroGLYCERIN (NITROSTAT) 0.4 MG SL tablet Place 1 tablet (0.4 mg total) under the tongue every 5 (five) minutes as needed. For chest pain (Patient not taking: Reported on 01/24/2022) 25 tablet 2   prochlorperazine (COMPAZINE) 10 MG tablet Take 1 tablet (10 mg total) by mouth every 6 (six) hours as needed for nausea or vomiting. (Patient not taking: Reported on 01/24/2022) 30 tablet 3   No current facility-administered medications for this visit.   Facility-Administered Medications Ordered in Other Visits  Medication Dose Route Frequency Provider Last Rate Last Admin   acetaminophen (TYLENOL) 325 MG tablet            acetaminophen (TYLENOL) tablet 650 mg  650 mg Oral Once Malek Skog T, PA-C       diphenhydrAMINE (BENADRYL) 25 mg capsule            diphenhydrAMINE (BENADRYL) capsule 25 mg  25 mg Oral Once Tirth Cothron T, PA-C       heparin lock flush 100 unit/mL  500 Units Intracatheter Daily PRN Jackey Housey T, PA-C       montelukast (SINGULAIR) 10 MG tablet            sodium chloride flush (NS) 0.9 % injection 10 mL  10 mL Intracatheter PRN Dede Query T, PA-C        ALLERGIES:  Allergies  Allergen Reactions   Azithromycin Other (See Comments)    Sore mouth and fever blisters around mouth, sores in nose area as well   Doxazosin Shortness Of Breath   Atenolol Other (See Comments)    UNKNOWN REACTION   Hydrocodone Nausea And Vomiting   Levofloxacin Other (See Comments)    Caused stomach problems.   Morphine Other  (See Comments)    "made me crazy"   Penicillins Nausea And Vomiting and Other (See Comments)    Has patient had a PCN reaction causing immediate rash, facial/tongue/throat swelling, SOB or lightheadedness with hypotension: No Has patient had a PCN reaction causing severe rash involving mucus membranes or skin necrosis: No Has patient had a PCN reaction that required hospitalization No Has patient had a PCN reaction occurring within the last 10 years: No If all of the above answers are "NO", then may proceed with Cephalosporin use.    Sulfonamide Derivatives Nausea And Vomiting    PHYSICAL EXAM:  Performance status (ECOG): 0 - Asymptomatic  There were no vitals filed for this visit.  Wt Readings from Last 3 Encounters:  01/24/22 121 lb 1.6 oz (54.9 kg)  01/22/22 127 lb (57.6 kg)  01/16/22 127 lb (57.6 kg)   Physical Exam Vitals reviewed.  Cardiovascular:     Rate and Rhythm: Normal rate and regular rhythm.     Pulses: Normal pulses.     Heart sounds: Normal heart sounds.  Pulmonary:  Effort: Pulmonary effort is normal.     Breath sounds: Normal breath sounds.  Skin:    Findings: Bruising (bilateral forearms) present.  Neurological:     General: No focal deficit present.     Mental Status: He is alert and oriented to person, place, and time.  Psychiatric:        Mood and Affect: Mood normal.        Behavior: Behavior normal.     LABORATORY DATA:  I have reviewed the labs as listed.     Latest Ref Rng & Units 01/24/2022    8:24 AM 01/17/2022    4:30 PM 01/17/2022    6:06 AM  CBC  WBC 4.0 - 10.5 K/uL 3.0   1.5   Hemoglobin 13.0 - 17.0 g/dL 6.5  8.1  6.9   Hematocrit 39.0 - 52.0 % 20.1  23.8  20.5   Platelets 150 - 400 K/uL 51   60       Latest Ref Rng & Units 01/24/2022    8:24 AM 01/17/2022    6:06 AM 01/16/2022    2:35 PM  CMP  Glucose 70 - 99 mg/dL 99  95  104   BUN 8 - 23 mg/dL _0 Creatinine 0.61 - 1.24 mg/dL 0.85  0.84  0.94   Sodium 135 - 145  mmol/L 130  132  129   Potassium 3.5 - 5.1 mmol/L 3.9  3.7  3.9   Chloride 98 - 111 mmol/L 102  104  100   CO2 22 - 32 mmol/L _1 Calcium 8.9 - 10.3 mg/dL 8.4  8.4  8.6   Total Protein 6.5 - 8.1 g/dL 8.2     Total Bilirubin 0.3 - 1.2 mg/dL 0.9     Alkaline Phos 38 - 126 U/L 49     AST 15 - 41 U/L 17     ALT 0 - 44 U/L 22       DIAGNOSTIC IMAGING:  I have independently reviewed the scans and discussed with the patient. CT CHEST ABDOMEN PELVIS W CONTRAST  Result Date: 01/16/2022 CLINICAL DATA:  Trauma, fall EXAM: CT CHEST, ABDOMEN, AND PELVIS WITH CONTRAST TECHNIQUE: Multidetector CT imaging of the chest, abdomen and pelvis was performed following the standard protocol during bolus administration of intravenous contrast. RADIATION DOSE REDUCTION: This exam was performed according to the departmental dose-optimization program which includes automated exposure control, adjustment of the mA and/or kV according to patient size and/or use of iterative reconstruction technique. CONTRAST:  92m OMNIPAQUE IOHEXOL 300 MG/ML  SOLN COMPARISON:  Previous studies including the CT abdomen done on 06/14/2021 FINDINGS: CT CHEST FINDINGS Cardiovascular: There is homogeneous enhancement in thoracic aorta. There are no intraluminal filling defects in central pulmonary artery branches. There is ectasia of the main pulmonary artery measuring 3.4 cm suggesting pulmonary arterial hypertension. Coronary artery calcifications are seen. Mediastinum/Nodes: There is no mediastinal hematoma. Left IJ chest port is noted with the tip of the catheter in superior vena cava. There are pockets of air in subcutaneous plane in the anterior chest, more so on the left side, possibly suggesting recent accessing of the chest port. There is a 2.4 cm loculated air containing structure in subcutaneous plane in the right anterior chest wall. Lungs/Pleura: Centrilobular and panlobular emphysema is seen. Increased interstitial markings are  seen in the posterior segment of right upper lobe and in posterior right lower lobe. There is ectasia bronchi  in the area of increased interstitial markings. Findings may suggest scarring or interstitial pneumonia. There are linear densities in right middle lobe and lingula suggesting scarring or subsegmental atelectasis. Minimal left pleural effusion is seen. There is no pneumothorax. Musculoskeletal: No acute findings are seen in bony structures in the thorax. CT ABDOMEN PELVIS FINDINGS Hepatobiliary: There are few low-density lesions in liver largest measuring 3.4 cm in the left lobe, possibly cysts. There is no dilation of bile ducts. Gallbladder stones are seen. There is no wall thickening in gallbladder. Pancreas: No focal abnormalities are seen. Spleen: Unremarkable. Adrenals/Urinary Tract: Adrenals are unremarkable. There is no hydronephrosis. There is 2 mm calculus in the right kidney. There is 1 mm calculus in the lower pole of left kidney. There is focal cortical thinning in the lateral aspect of left kidney, possibly related to previous intervention or pyelonephritis. There are no ureteral stones. Urinary bladder is unremarkable. Stomach/Bowel: Stomach is unremarkable. Small bowel loops are not dilated. Appendix is not dilated. There is no significant wall thickening in colon. There is no pericardial stranding. Diverticula are seen in the colon without signs of focal acute diverticulitis. Vascular/Lymphatic: Scattered calcifications are seen in aorta and its major branches. No significant lymphadenopathy is seen. Reproductive: Unremarkable. Other: There is no ascites or pneumoperitoneum. Right inguinal hernia containing fat is seen. Musculoskeletal: No recent fracture is seen in bony structures. IMPRESSION: No acute findings are seen in CT scan of chest, abdomen and pelvis. There is no focal pulmonary consolidation. There is no pleural effusion or pneumothorax. There is no evidence of mediastinal  hematoma. Coronary artery disease. Ectasia of the main pulmonary artery suggests pulmonary arterial hypertension. There is no laceration in the solid organs. There is no bowel wall thickening. There is no ascites or pneumoperitoneum. No displaced fractures are seen in bony structures. There is loculated pocket of air in the right anterior chest wall, most likely suggesting recent removal of right chest port. There are pockets of air adjacent to left IJ chest port suggesting recent placement of left chest port. COPD. There are scattered foci of increased interstitial markings, especially in posterior segment of right upper lobe and superior segment of right lower lobe suggesting scarring. Hepatic cysts. Small bilateral renal stones. Diverticulosis of colon. Other findings as described in the body of the report. Electronically Signed   By: Elmer Picker M.D.   On: 01/16/2022 17:30   CT Head Wo Contrast  Result Date: 01/16/2022 CLINICAL DATA:  Fall, neck trauma EXAM: CT HEAD WITHOUT CONTRAST CT CERVICAL SPINE WITHOUT CONTRAST TECHNIQUE: Multidetector CT imaging of the head and cervical spine was performed following the standard protocol without intravenous contrast. Multiplanar CT image reconstructions of the cervical spine were also generated. RADIATION DOSE REDUCTION: This exam was performed according to the departmental dose-optimization program which includes automated exposure control, adjustment of the mA and/or kV according to patient size and/or use of iterative reconstruction technique. COMPARISON:  CT head 01/04/2021 FINDINGS: CT HEAD FINDINGS Brain: Generalized atrophy without hydrocephalus. Patchy white matter hypodensity bilaterally unchanged. Negative for acute infarct, hemorrhage, mass Vascular: Negative for hyperdense vessel Skull: Negative calvarium Sinuses/Orbits: Expansile soft tissue mass in the left maxillary antrum is unchanged. This extends into the right nasal cavity. No aggressive  bony destruction. Other: None CT CERVICAL SPINE FINDINGS Alignment: Normal Skull base and vertebrae: Negative for fracture or mass Soft tissues and spinal canal: No soft tissue mass or adenopathy. Atherosclerotic calcification of the carotid bifurcation bilaterally left jugular vascular catheter with gas  in the soft tissues likely due to recent placement. Disc levels: Mild disc degeneration. Moderate bilateral facet degeneration. Upper chest: Apical emphysema without acute abnormality Other: None IMPRESSION: 1. No acute intracranial injury. 2. Negative for cervical spine fracture 3. Expansile soft tissue mass in the left maxillary sinus extending into the left nasal cavity. This is unchanged from CT of 01/04/2021. 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 Electronically Signed   By: Franchot Gallo M.D.   On: 01/16/2022 17:17   CT Cervical Spine Wo Contrast  Result Date: 01/16/2022 CLINICAL DATA:  Fall, neck trauma EXAM: CT HEAD WITHOUT CONTRAST CT CERVICAL SPINE WITHOUT CONTRAST TECHNIQUE: Multidetector CT imaging of the head and cervical spine was performed following the standard protocol without intravenous contrast. Multiplanar CT image reconstructions of the cervical spine were also generated. RADIATION DOSE REDUCTION: This exam was performed according to the departmental dose-optimization program which includes automated exposure control, adjustment of the mA and/or kV according to patient size and/or use of iterative reconstruction technique. COMPARISON:  CT head 01/04/2021 FINDINGS: CT HEAD FINDINGS Brain: Generalized atrophy without hydrocephalus. Patchy white matter hypodensity bilaterally unchanged. Negative for acute infarct, hemorrhage, mass Vascular: Negative for hyperdense vessel Skull: Negative calvarium Sinuses/Orbits: Expansile soft tissue mass in the left maxillary antrum is unchanged. This extends into the right nasal cavity. No aggressive bony destruction. Other: None CT CERVICAL SPINE FINDINGS Alignment:  Normal Skull base and vertebrae: Negative for fracture or mass Soft tissues and spinal canal: No soft tissue mass or adenopathy. Atherosclerotic calcification of the carotid bifurcation bilaterally left jugular vascular catheter with gas in the soft tissues likely due to recent placement. Disc levels: Mild disc degeneration. Moderate bilateral facet degeneration. Upper chest: Apical emphysema without acute abnormality Other: None IMPRESSION: 1. No acute intracranial injury. 2. Negative for cervical spine fracture 3. Expansile soft tissue mass in the left maxillary sinus extending into the left nasal cavity. This is unchanged from CT of 01/04/2021. 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 Electronically Signed   By: Franchot Gallo M.D.   On: 01/16/2022 17:17   IR IMAGING GUIDED PORT INSERTION  Result Date: 01/15/2022 INDICATION: Port malfunction on recently placed port, 12/18/2021. Hub detachment. EXAM: Procedures: 1. IMPLANTED PORT A CATH PLACEMENT WITH ULTRASOUND AND FLUOROSCOPIC GUIDANCE 2. REMOVAL OF IMPLANTED TUNNELED PORT-A-CATH MEDICATIONS: Ancef 2 gm IV; The antibiotic was administered within an appropriate time interval prior to skin puncture. ANESTHESIA/SEDATION: Moderate (conscious) sedation was employed during this procedure. A total of Versed 2 mg and Fentanyl 100 mcg was administered intravenously. Moderate Sedation Time: 58 minutes. The patient's level of consciousness and vital signs were monitored continuously by radiology nursing throughout the procedure under my direct supervision. FLUOROSCOPY TIME:  Fluoroscopic dose; 2 mGy COMPLICATIONS: None immediate. PROCEDURE: The procedure, risks, benefits, and alternatives were explained to the patient and/or patient's representative . Questions regarding the procedure were encouraged and answered. The patient and/or patient's representative understands and consents to the procedure. PORT PLACEMENT; The LEFT neck and chest was prepped with chlorhexidine in a  sterile fashion, and a sterile drape was applied covering the operative field. Maximum barrier sterile technique with sterile gowns and gloves were used for the procedure. A timeout was performed prior to the initiation of the procedure. Local anesthesia was provided with 1% lidocaine with epinephrine. After creating a small venotomy incision, a micropuncture kit was utilized to access the internal jugular vein under direct, real-time ultrasound guidance. Ultrasound image documentation was performed. The microwire was kinked to measure appropriate catheter length. A subcutaneous port pocket was  then created along the upper chest wall utilizing a combination of sharp and blunt dissection. The pocket was irrigated with sterile saline. A single lumen Non-ISP power injectable port was chosen for placement. The 8 Fr catheter was tunneled from the port pocket site to the venotomy incision. The port was placed in the pocket. The external catheter was trimmed to appropriate length. At the venotomy, an 8 Fr peel-away sheath was placed over a guidewire under fluoroscopic guidance. The catheter was then placed through the sheath and the sheath was removed. Final catheter positioning was confirmed and documented with a fluoroscopic spot radiograph. The port was accessed with a Huber needle, aspirated and flushed with heparinized saline. The port pocket incision was closed with interrupted 3-0 Vicryl suture then Dermabond was applied, including at the venotomy incision. Dressings were placed. PORT REMOVAL; Attention was then directed to the RIGHT chest and previously placed Port-A-Cath. The site had been prepped with chlorhexidine. Local anesthesia was provided with 1% lidocaine with epinephrine. A timeout was performed prior to the initiation of the procedure. An incision was made overlying the Port-A-Cath with a scalpel. Utilizing sharp and blunt dissection, the Port-A-Cath was removed completely. The pocked was irrigated with  sterile saline. Wound closure was performed with interrupted subcutaneous 2-0 Vicryl sutures then Dermabond was applied at the skin. Dressings were applied. The patient tolerated the procedures well without immediate post procedural complication. IMPRESSION: 1. Successful placement of a LEFT chest internal jugular approach power injectable Port-A-Cath. The tip of the catheter is positioned within the superior cavoatrial junction. The catheter is ready for immediate use. 2. Successful removal of implanted RIGHT chest Port-A-Cath without immediate post procedural complication. Michaelle Birks, MD [facility] Radiology Electronically Signed   By: Michaelle Birks M.D.   On: 01/15/2022 23:15   IR REMOVAL TUN ACCESS W/ PORT W/O FL MOD SED  Result Date: 01/15/2022 INDICATION: Port malfunction on recently placed port, 12/18/2021. Hub detachment. EXAM: Procedures: 1. IMPLANTED PORT A CATH PLACEMENT WITH ULTRASOUND AND FLUOROSCOPIC GUIDANCE 2. REMOVAL OF IMPLANTED TUNNELED PORT-A-CATH MEDICATIONS: Ancef 2 gm IV; The antibiotic was administered within an appropriate time interval prior to skin puncture. ANESTHESIA/SEDATION: Moderate (conscious) sedation was employed during this procedure. A total of Versed 2 mg and Fentanyl 100 mcg was administered intravenously. Moderate Sedation Time: 58 minutes. The patient's level of consciousness and vital signs were monitored continuously by radiology nursing throughout the procedure under my direct supervision. FLUOROSCOPY TIME:  Fluoroscopic dose; 2 mGy COMPLICATIONS: None immediate. PROCEDURE: The procedure, risks, benefits, and alternatives were explained to the patient and/or patient's representative . Questions regarding the procedure were encouraged and answered. The patient and/or patient's representative understands and consents to the procedure. PORT PLACEMENT; The LEFT neck and chest was prepped with chlorhexidine in a sterile  fashion, and a sterile drape was applied covering the operative field. Maximum barrier sterile technique with sterile gowns and gloves were used for the procedure. A timeout was performed prior to the initiation of the procedure. Local anesthesia was provided with 1% lidocaine with epinephrine. After creating a small venotomy incision, a micropuncture kit was utilized to access the internal jugular vein under direct, real-time ultrasound guidance. Ultrasound image documentation was performed. The microwire was kinked to measure appropriate catheter length. A subcutaneous port pocket was then created along the upper chest wall utilizing a combination of sharp and blunt dissection. The pocket was irrigated with sterile saline. A single lumen Non-ISP power injectable port was chosen for  placement. The 8 Fr catheter was tunneled from the port pocket site to the venotomy incision. The port was placed in the pocket. The external catheter was trimmed to appropriate length. At the venotomy, an 8 Fr peel-away sheath was placed over a guidewire under fluoroscopic guidance. The catheter was then placed through the sheath and the sheath was removed. Final catheter positioning was confirmed and documented with a fluoroscopic spot radiograph. The port was accessed with a Huber needle, aspirated and flushed with heparinized saline. The port pocket incision was closed with interrupted 3-0 Vicryl suture then Dermabond was applied, including at the venotomy incision. Dressings were placed. PORT REMOVAL; Attention was then directed to the RIGHT chest and previously placed Port-A-Cath. The site had been prepped with chlorhexidine. Local anesthesia was provided with 1% lidocaine with epinephrine. A timeout was performed prior to the initiation of the procedure. An incision was made overlying the Port-A-Cath with a scalpel. Utilizing sharp and blunt dissection, the Port-A-Cath was removed completely. The pocked was irrigated with sterile  saline. Wound closure was performed with interrupted subcutaneous 2-0 Vicryl sutures then Dermabond was applied at the skin. Dressings were applied. The patient tolerated the procedures well without immediate post procedural complication. IMPRESSION: 1. Successful placement of a LEFT chest internal jugular approach power injectable Port-A-Cath. The tip of the catheter is positioned within the superior cavoatrial junction. The catheter is ready for immediate use. 2. Successful removal of implanted RIGHT chest Port-A-Cath without immediate post procedural complication. Michaelle Birks, MD [facility] Inter[facility] Radiology Specialists [facility] Radiology Electronically Signed   By: Michaelle Birks M.D.   On: 01/15/2022 23:15   IR CV Line Injection  Result Date: 12/28/2021 INDICATION: Recent port placement. Malfunctioning with question of dislodgement. EXAM: FLUOROSCOPIC GUIDED PORT A CATHETER CHECK COMPARISON:  Chest XR, 12/24/2021.  IR fluoroscopy, 12/18/2021. MEDICATIONS: None. CONTRAST:  10 mL Omnipaque 300 FLUOROSCOPY TIME:  Fluoroscopic dose; 2 mGy COMPLICATIONS: None immediate. TECHNIQUE: The procedure, risks, benefits, and alternatives were explained to the patient and/or patient's representative and informed written consent was obtained. A timeout was performed prior to the initiation of the procedure. The patient's chest port a catheter was accessed by the IR RN. The patient was placed supine on the fluoroscopy table. A preprocedural spot fluoroscopic image was obtained of the chest in existing port a catheter. Contrast was injected via the Port a catheter and images were reviewed. The Port a catheter was flushed with a heparin dwell and de accessed. A dressing was placed. The patient tolerated the procedure well without immediate postprocedural complication. FINDINGS: *RIGHT internal jugular vein approach port a catheter with tip projected over the expected location of the superior cavoatrial junction.  *Contrast injection demonstrated with patent catheter, without evidence of catheter kink or fracture. *The port hub, however, appears to have partially dis-attached since placement on 12/18/2021. See key image. *Patient with significant bruising and ecchymosis within the RIGHT chest subcutaneous tissues. No discrete contrast extravasation was appreciated. IMPRESSION: 1. Stable positioning of RIGHT internal jugular vein approach port catheter. 2. The port hub appears to have partially dis-attached since recent placement. See key image. Significant bruising and ecchymosis of the RIGHT chest, no discrete extravasation on port injection. PLAN: DO NOT USE RIGHT chest port catheter. The patient will be scheduled for and return to Vascular Interventional Radiology (VIR) for port removal and replacement. Michaelle Birks, MD [facility] Inter[facility] Radiology Specialists [facility] Radiology Electronically Signed   By: Michaelle Birks M.D.   On: 12/28/2021 10:04  ASSESSMENT:  1.  IgG lambda plasma cell myeloma: - Work-up for macrocytic anemia on 12/01/2020 showed M spike of 2.9 g.  Immunofixation IgG lambda. - Lambda light chains elevated at 284.  Light chain ratio was 0.04.  LDH was 163.  Creatinine was 0.9 and calcium 8.9. - Bone marrow biopsy on 12/18/2020-hypercellular marrow for age with 48% atypical plasma cells with lambda light chain restriction. - Chromosome analysis was normal. 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 - Revlimid 5 mg, 2 weeks on/1 week off started on 01/20/2021.  Dexamethasone weekly 10 mg added on 02/07/2021.  Revlimid is discontinued around 06/15/2021 due to poor tolerance and ineffectiveness at low-dose.  Thrombocytopenia and anemia. - Velcade weekly on days 1, 8, 15 every 21 days along with dexamethasone 10 mg weekly started on 06/25/2021. --Daratumumb weekly added on 12/20/2021.   2.  Social/family  history: - Lives at home with his wife.  He does all ADLs and IADLs.  He even does yard work. - He worked at [employer] for 35 years.  Denies any chemical exposure.  Non-smoker. - No family history of malignancies.   PLAN:  1.  IgG lambda plasma cell myeloma, high risk: -Presents today for Daratumumab and Velcade injection --Labs from today were reviewed. Wbc 3.0, Hgb 6.5, Plt 51K, Creatinine 0.85, LFTs normal --Recommend to delay treatment by one week to improve overall PS since recent hospitalization. Patient still feels quite week and reports dizziness.  --RTC in one week with labs, follow up to determine if he can resume Daratumumab and Velcarde injections.    2.  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  Hemoglobin today 6.5.  Will order 1 unit PRBC today. Ferritin 1,415 today.   3.  Thromboprophylaxis: - Continue aspirin 81 mg daily.  4.  Right ankle swelling: - Can continue Lasix as needed.  5.  ID prophylaxis: - Continue acyclovir twice daily.  6. Dark stools: --Provided stool cards to patient to evaluate for GI bleed.    Orders placed this encounter:  No orders of the defined types were placed in this encounter.   Patient and his son expressed understanding and satisfaction with the plan provided.   I have spent a total of 30 minutes minutes of face-to-face and non-face-to-face time, preparing to see the patient, performing a medically appropriate examination, counseling and educating the patient, ordering medications/tests/procedures, referring and communicating with other health care professionals, documenting clinical information in the electronic health record, independently interpreting results and communicating results to the patient, and care coordination.   Dede Query PA-C Dept of Hematology and [place]  Phone: [phone]

## 2022-01-24 NOTE — Progress Notes (Signed)
[facility] CSW Progress Note  Clinical Education officer, museum  met w/ pt and son [name])  to discuss transportation concerns.  Pt recently had a fall and his overall health has started to decline.  [name] expressed concerns regarding transportation as pt has not been able to drive himself since being admitted to the hospital last week.  [name] works full time and resides in [place], so he is not always available for pt's weekly infusion appointment.  CSW provided pt/son w/ information for [employer] as well as contact information for [place]'s agency on aging to see if pt may be eligible for additional benefits.  Pt was sent home with home care following his recent admission to the hospital for assistance w/ dressing changes; however, per son the RN came to the house once and closed the case.  CSW discussed home care covered by insurance versus private pay as pt's needs may be more custodial.  Son provided w/ a list of private pay agencies.  Per son an appointment has also been booked w/ a palliative care doctor who will be sending an RN to the house for an assessment.  Meals continue to be delivered to the home through [facility].  CSW to continue to follow to provide supportive services as needed.      Henriette Combs, LCSW

## 2022-01-24 NOTE — Progress Notes (Signed)
Patient presented today for treatment. Labs reviewed at office visit. No treatment today per Dede Query PA-C. Will give one unit of blood today per oders.   Blood bank unable to crossmatch blood for today. Will reschedule patient for transfusion tomorrow. Patient and son informed.  Vitals stable and discharged home from clinic via wheelchair.  Follow up as scheduled.

## 2022-01-24 NOTE — Progress Notes (Signed)
CRITICAL VALUE ALERT Critical value received:  HGB 6.5 .  Date of notification:  01-24-2022 Time of notification: 09:31 am.  Critical value read back:  Yes.   Nurse who received alert:  B. Lilli Few MD notified time and response:  Remus Blake PA @ 09:52 am by secure chat. Treatment held today. Orders received to administer 1 unit of blood today. Orders in and blood bank aware.

## 2022-01-24 NOTE — Patient Instructions (Signed)
[facility]  Discharge Instructions: Thank you for choosing [facility] to provide your oncology and hematology care.  If you have a lab appointment with the Cancer Center, please come in thru the Main Entrance and check in at the main information desk.  Wear comfortable clothing and clothing appropriate for easy access to any Portacath or PICC line.   We strive to give you quality time with your provider. You may need to reschedule your appointment if you arrive late (15 or more minutes).  Arriving late affects you and other patients whose appointments are after yours.  Also, if you miss three or more appointments without notifying the office, you may be dismissed from the clinic at the provider's discretion.      For prescription refill requests, have your pharmacy contact our office and allow 72 hours for refills to be completed.     To help prevent nausea and vomiting after your treatment, we encourage you to take your nausea medication as directed.  BELOW ARE SYMPTOMS THAT SHOULD BE REPORTED IMMEDIATELY: *FEVER GREATER THAN 100.4 F (38 C) OR HIGHER *CHILLS OR SWEATING *NAUSEA AND VOMITING THAT IS NOT CONTROLLED WITH YOUR NAUSEA MEDICATION *UNUSUAL SHORTNESS OF BREATH *UNUSUAL BRUISING OR BLEEDING *URINARY PROBLEMS (pain or burning when urinating, or frequent urination) *BOWEL PROBLEMS (unusual diarrhea, constipation, pain near the anus) TENDERNESS IN MOUTH AND THROAT WITH OR WITHOUT PRESENCE OF ULCERS (sore throat, sores in mouth, or a toothache) UNUSUAL RASH, SWELLING OR PAIN  UNUSUAL VAGINAL DISCHARGE OR ITCHING   Items with * indicate a potential emergency and should be followed up as soon as possible or go to the Emergency Department if any problems should occur.  Please show the CHEMOTHERAPY ALERT CARD or IMMUNOTHERAPY ALERT CARD at check-in to the Emergency Department and triage nurse.  Should you have questions after your visit or need to  cancel or reschedule your appointment, please contact [facility] [phone]  and follow the prompts.  Office hours are 8:00 a.m. to 4:30 p.m. Monday - Friday. Please note that voicemails left after 4:00 p.m. may not be returned until the following business day.  We are closed weekends and major holidays. You have access to a nurse at all times for urgent questions. Please call the main number to the clinic [phone] and follow the prompts.  For any non-urgent questions, you may also contact your provider using MyChart. We now offer e-Visits for anyone 18 and older to request care online for non-urgent symptoms. For details visit [email or web address].   Also download the MyChart app! Go to the app store, search "MyChart", open the app, select [facility], and log in with your MyChart username and password.  Masks are optional in the cancer centers. If you would like for your care team to wear a mask while they are taking care of you, please let them know. You may have one support person who is at least 86 years old accompany you for your appointments.  

## 2022-01-25 ENCOUNTER — Inpatient Hospital Stay: Payer: Medicare Other

## 2022-01-25 VITALS — BP 125/54 | HR 57 | Temp 97.2°F | Resp 18

## 2022-01-25 DIAGNOSIS — D630 Anemia in neoplastic disease: Secondary | ICD-10-CM | POA: Diagnosis not present

## 2022-01-25 DIAGNOSIS — Z95828 Presence of other vascular implants and grafts: Secondary | ICD-10-CM

## 2022-01-25 DIAGNOSIS — C9000 Multiple myeloma not having achieved remission: Secondary | ICD-10-CM

## 2022-01-25 DIAGNOSIS — Z5112 Encounter for antineoplastic immunotherapy: Secondary | ICD-10-CM | POA: Diagnosis not present

## 2022-01-25 DIAGNOSIS — Z79899 Other long term (current) drug therapy: Secondary | ICD-10-CM | POA: Diagnosis not present

## 2022-01-25 LAB — KAPPA/LAMBDA LIGHT CHAINS
Kappa free light chain: 4 mg/L (ref 3.3–19.4)
Kappa, lambda light chain ratio: 0.01 — ABNORMAL LOW (ref 0.26–1.65)
Lambda free light chains: 669.8 mg/L — ABNORMAL HIGH (ref 5.7–26.3)

## 2022-01-25 MED ORDER — DIPHENHYDRAMINE HCL 25 MG PO CAPS
25.0000 mg | ORAL_CAPSULE | Freq: Once | ORAL | Status: AC
  Administered 2022-01-25: 25 mg via ORAL
  Filled 2022-01-25: qty 1

## 2022-01-25 MED ORDER — HEPARIN SOD (PORK) LOCK FLUSH 100 UNIT/ML IV SOLN: 250.0000 [IU] | INTRAVENOUS | Status: DC | PRN

## 2022-01-25 MED ORDER — HEPARIN SOD (PORK) LOCK FLUSH 100 UNIT/ML IV SOLN
500.0000 [IU] | Freq: Once | INTRAVENOUS | Status: AC
  Administered 2022-01-25: 500 [IU] via INTRAVENOUS

## 2022-01-25 MED ORDER — SODIUM CHLORIDE 0.9% IV SOLUTION
250.0000 mL | Freq: Once | INTRAVENOUS | Status: AC
  Administered 2022-01-25: 250 mL via INTRAVENOUS

## 2022-01-25 MED ORDER — SODIUM CHLORIDE 0.9% FLUSH
10.0000 mL | INTRAVENOUS | Status: AC | PRN
  Administered 2022-01-25: 10 mL

## 2022-01-25 MED ORDER — ACETAMINOPHEN 325 MG PO TABS
650.0000 mg | ORAL_TABLET | Freq: Once | ORAL | Status: AC
  Administered 2022-01-25: 650 mg via ORAL
  Filled 2022-01-25: qty 2

## 2022-01-25 NOTE — Progress Notes (Signed)
Patient presents today for 1 unit of blood per providers orders, Patient tolerated therapy with no complaints voiced. Side effects with management reviewed with understanding verbalized. Port site clean and dry with no bruising or swelling noted at site. Good blood return noted before and after administration of therapy. Band aid applied. Patient left in satisfactory condition with VSS and no s/s of distress noted.

## 2022-01-25 NOTE — Patient Instructions (Signed)
[facility]  Discharge Instructions: Thank you for choosing [facility] to provide your oncology and hematology care.  If you have a lab appointment with the [facility], please come in thru the Main Entrance and check in at the main information desk.  Wear comfortable clothing and clothing appropriate for easy access to any Portacath or PICC line.   We strive to give you quality time with your provider. You may need to reschedule your appointment if you arrive late (15 or more minutes).  Arriving late affects you and other patients whose appointments are after yours.  Also, if you miss three or more appointments without notifying the office, you may be dismissed from the clinic at the provider's discretion.      For prescription refill requests, have your pharmacy contact our office and allow 72 hours for refills to be completed.    Today you received the following I unit of PRBCs, return as scheduled.   To help prevent nausea and vomiting after your treatment, we encourage you to take your nausea medication as directed.  BELOW ARE SYMPTOMS THAT SHOULD BE REPORTED IMMEDIATELY: *FEVER GREATER THAN 100.4 F (38 C) OR HIGHER *CHILLS OR SWEATING *NAUSEA AND VOMITING THAT IS NOT CONTROLLED WITH YOUR NAUSEA MEDICATION *UNUSUAL SHORTNESS OF BREATH *UNUSUAL BRUISING OR BLEEDING *URINARY PROBLEMS (pain or burning when urinating, or frequent urination) *BOWEL PROBLEMS (unusual diarrhea, constipation, pain near the anus) TENDERNESS IN MOUTH AND THROAT WITH OR WITHOUT PRESENCE OF ULCERS (sore throat, sores in mouth, or a toothache) UNUSUAL RASH, SWELLING OR PAIN  UNUSUAL VAGINAL DISCHARGE OR ITCHING   Items with * indicate a potential emergency and should be followed up as soon as possible or go to the Emergency Department if any problems should occur.  Please show the CHEMOTHERAPY ALERT CARD or IMMUNOTHERAPY ALERT CARD at check-in to the Emergency Department  and triage nurse.  Should you have questions after your visit or need to cancel or reschedule your appointment, please contact [facility] [phone]  and follow the prompts.  Office hours are 8:00 a.m. to 4:30 p.m. Monday - Friday. Please note that voicemails left after 4:00 p.m. may not be returned until the following business day.  We are closed weekends and major holidays. You have access to a nurse at all times for urgent questions. Please call the main number to the clinic [phone] and follow the prompts.  For any non-urgent questions, you may also contact your provider using MyChart. We now offer e-Visits for anyone 1 and older to request care online for non-urgent symptoms. For details visit [email or web address].   Also download the MyChart app! Go to the app store, search "MyChart", open the app, select [facility], and log in with your MyChart username and password.  Masks are optional in the cancer centers. If you would like for your care team to wear a mask while they are taking care of you, please let them know. You may have one support person who is at least 86 years old accompany you for your appointments.

## 2022-01-26 ENCOUNTER — Other Ambulatory Visit: Payer: Self-pay | Admitting: Internal Medicine

## 2022-01-26 DIAGNOSIS — K219 Gastro-esophageal reflux disease without esophagitis: Secondary | ICD-10-CM

## 2022-01-26 LAB — TYPE AND SCREEN
ABO/RH(D): O POS
Antibody Screen: POSITIVE
DAT, IgG: POSITIVE
Unit division: 0

## 2022-01-26 LAB — BPAM RBC
Blood Product Expiration Date: 202310122359
ISSUE DATE / TIME: 202309221049
Unit Type and Rh: 9500

## 2022-01-28 DIAGNOSIS — H401113 Primary open-angle glaucoma, right eye, severe stage: Secondary | ICD-10-CM | POA: Diagnosis not present

## 2022-01-28 DIAGNOSIS — H43813 Vitreous degeneration, bilateral: Secondary | ICD-10-CM | POA: Diagnosis not present

## 2022-01-28 DIAGNOSIS — H04123 Dry eye syndrome of bilateral lacrimal glands: Secondary | ICD-10-CM | POA: Diagnosis not present

## 2022-01-28 DIAGNOSIS — D509 Iron deficiency anemia, unspecified: Secondary | ICD-10-CM | POA: Diagnosis not present

## 2022-01-28 DIAGNOSIS — T451X5D Adverse effect of antineoplastic and immunosuppressive drugs, subsequent encounter: Secondary | ICD-10-CM | POA: Diagnosis not present

## 2022-01-28 DIAGNOSIS — H353132 Nonexudative age-related macular degeneration, bilateral, intermediate dry stage: Secondary | ICD-10-CM | POA: Diagnosis not present

## 2022-01-28 DIAGNOSIS — H0102B Squamous blepharitis left eye, upper and lower eyelids: Secondary | ICD-10-CM | POA: Diagnosis not present

## 2022-01-28 DIAGNOSIS — D6481 Anemia due to antineoplastic chemotherapy: Secondary | ICD-10-CM | POA: Diagnosis not present

## 2022-01-28 DIAGNOSIS — I509 Heart failure, unspecified: Secondary | ICD-10-CM | POA: Diagnosis not present

## 2022-01-28 DIAGNOSIS — C9000 Multiple myeloma not having achieved remission: Secondary | ICD-10-CM | POA: Diagnosis not present

## 2022-01-28 DIAGNOSIS — I110 Hypertensive heart disease with heart failure: Secondary | ICD-10-CM | POA: Diagnosis not present

## 2022-01-28 DIAGNOSIS — H401121 Primary open-angle glaucoma, left eye, mild stage: Secondary | ICD-10-CM | POA: Diagnosis not present

## 2022-01-28 DIAGNOSIS — H0102A Squamous blepharitis right eye, upper and lower eyelids: Secondary | ICD-10-CM | POA: Diagnosis not present

## 2022-01-28 DIAGNOSIS — Z961 Presence of intraocular lens: Secondary | ICD-10-CM | POA: Diagnosis not present

## 2022-01-28 LAB — PROTEIN ELECTROPHORESIS, SERUM
A/G Ratio: 0.6 — ABNORMAL LOW (ref 0.7–1.7)
Albumin ELP: 3.1 g/dL (ref 2.9–4.4)
Alpha-1-Globulin: 0.2 g/dL (ref 0.0–0.4)
Alpha-2-Globulin: 0.8 g/dL (ref 0.4–1.0)
Beta Globulin: 0.7 g/dL (ref 0.7–1.3)
Gamma Globulin: 3.2 g/dL — ABNORMAL HIGH (ref 0.4–1.8)
Globulin, Total: 4.9 g/dL — ABNORMAL HIGH (ref 2.2–3.9)
M-Spike, %: 3 g/dL — ABNORMAL HIGH
Total Protein ELP: 8 g/dL (ref 6.0–8.5)

## 2022-01-29 DIAGNOSIS — C642 Malignant neoplasm of left kidney, except renal pelvis: Secondary | ICD-10-CM | POA: Diagnosis not present

## 2022-01-29 DIAGNOSIS — Z681 Body mass index (BMI) 19 or less, adult: Secondary | ICD-10-CM | POA: Diagnosis not present

## 2022-01-29 DIAGNOSIS — T451X5D Adverse effect of antineoplastic and immunosuppressive drugs, subsequent encounter: Secondary | ICD-10-CM | POA: Diagnosis not present

## 2022-01-29 DIAGNOSIS — I5030 Unspecified diastolic (congestive) heart failure: Secondary | ICD-10-CM | POA: Diagnosis not present

## 2022-01-29 DIAGNOSIS — I509 Heart failure, unspecified: Secondary | ICD-10-CM | POA: Diagnosis not present

## 2022-01-29 DIAGNOSIS — R55 Syncope and collapse: Secondary | ICD-10-CM | POA: Diagnosis not present

## 2022-01-29 DIAGNOSIS — D509 Iron deficiency anemia, unspecified: Secondary | ICD-10-CM | POA: Diagnosis not present

## 2022-01-29 DIAGNOSIS — D6481 Anemia due to antineoplastic chemotherapy: Secondary | ICD-10-CM | POA: Diagnosis not present

## 2022-01-29 DIAGNOSIS — I10 Essential (primary) hypertension: Secondary | ICD-10-CM | POA: Diagnosis not present

## 2022-01-29 DIAGNOSIS — I110 Hypertensive heart disease with heart failure: Secondary | ICD-10-CM | POA: Diagnosis not present

## 2022-01-29 DIAGNOSIS — C9000 Multiple myeloma not having achieved remission: Secondary | ICD-10-CM | POA: Diagnosis not present

## 2022-01-30 DIAGNOSIS — C9000 Multiple myeloma not having achieved remission: Secondary | ICD-10-CM | POA: Diagnosis not present

## 2022-01-30 DIAGNOSIS — I110 Hypertensive heart disease with heart failure: Secondary | ICD-10-CM | POA: Diagnosis not present

## 2022-01-30 DIAGNOSIS — T451X5D Adverse effect of antineoplastic and immunosuppressive drugs, subsequent encounter: Secondary | ICD-10-CM | POA: Diagnosis not present

## 2022-01-30 DIAGNOSIS — D509 Iron deficiency anemia, unspecified: Secondary | ICD-10-CM | POA: Diagnosis not present

## 2022-01-30 DIAGNOSIS — I509 Heart failure, unspecified: Secondary | ICD-10-CM | POA: Diagnosis not present

## 2022-01-30 DIAGNOSIS — D6481 Anemia due to antineoplastic chemotherapy: Secondary | ICD-10-CM | POA: Diagnosis not present

## 2022-01-31 ENCOUNTER — Ambulatory Visit: Payer: Medicare Other

## 2022-01-31 ENCOUNTER — Other Ambulatory Visit: Payer: Medicare Other

## 2022-01-31 ENCOUNTER — Inpatient Hospital Stay (HOSPITAL_BASED_OUTPATIENT_CLINIC_OR_DEPARTMENT_OTHER): Payer: Medicare Other | Admitting: Hematology

## 2022-01-31 ENCOUNTER — Ambulatory Visit: Payer: Medicare Other | Admitting: Hematology

## 2022-01-31 ENCOUNTER — Inpatient Hospital Stay: Payer: Medicare Other

## 2022-01-31 VITALS — BP 120/51 | HR 76 | Temp 97.9°F | Resp 18

## 2022-01-31 DIAGNOSIS — C9000 Multiple myeloma not having achieved remission: Secondary | ICD-10-CM

## 2022-01-31 DIAGNOSIS — D649 Anemia, unspecified: Secondary | ICD-10-CM

## 2022-01-31 DIAGNOSIS — D630 Anemia in neoplastic disease: Secondary | ICD-10-CM | POA: Diagnosis not present

## 2022-01-31 DIAGNOSIS — Z79899 Other long term (current) drug therapy: Secondary | ICD-10-CM | POA: Diagnosis not present

## 2022-01-31 DIAGNOSIS — Z5112 Encounter for antineoplastic immunotherapy: Secondary | ICD-10-CM | POA: Diagnosis not present

## 2022-01-31 LAB — COMPREHENSIVE METABOLIC PANEL
ALT: 22 U/L (ref 0–44)
AST: 22 U/L (ref 15–41)
Albumin: 2.5 g/dL — ABNORMAL LOW (ref 3.5–5.0)
Alkaline Phosphatase: 61 U/L (ref 38–126)
Anion gap: 2 — ABNORMAL LOW (ref 5–15)
BUN: 21 mg/dL (ref 8–23)
CO2: 24 mmol/L (ref 22–32)
Calcium: 8.5 mg/dL — ABNORMAL LOW (ref 8.9–10.3)
Chloride: 105 mmol/L (ref 98–111)
Creatinine, Ser: 0.83 mg/dL (ref 0.61–1.24)
GFR, Estimated: >60 mL/min (ref >60)
Glucose, Bld: 112 mg/dL — ABNORMAL HIGH (ref 70–99)
Potassium: 3.8 mmol/L (ref 3.5–5.1)
Sodium: 131 mmol/L — ABNORMAL LOW (ref 135–145)
Total Bilirubin: 0.7 mg/dL (ref 0.3–1.2)
Total Protein: 8.6 g/dL — ABNORMAL HIGH (ref 6.5–8.1)

## 2022-01-31 LAB — OCCULT BLOOD X 1 CARD TO LAB, STOOL
Fecal Occult Bld: NEGATIVE
Fecal Occult Bld: POSITIVE — AB
Fecal Occult Bld: POSITIVE — AB

## 2022-01-31 LAB — CBC WITH DIFFERENTIAL/PLATELET
Abs Immature Granulocytes: 0.02 10*3/uL (ref 0.00–0.07)
Basophils Absolute: 0 10*3/uL (ref 0.0–0.1)
Basophils Relative: 0 %
Eosinophils Absolute: 0 10*3/uL (ref 0.0–0.5)
Eosinophils Relative: 0 %
HCT: 22.9 % — ABNORMAL LOW (ref 39.0–52.0)
Hemoglobin: 7.5 g/dL — ABNORMAL LOW (ref 13.0–17.0)
Immature Granulocytes: 1 %
Lymphocytes Relative: 29 %
Lymphs Abs: 0.8 10*3/uL (ref 0.7–4.0)
MCH: 33.5 pg (ref 26.0–34.0)
MCHC: 32.8 g/dL (ref 30.0–36.0)
MCV: 102.2 fL — ABNORMAL HIGH (ref 80.0–100.0)
Monocytes Absolute: 0.7 10*3/uL (ref 0.1–1.0)
Monocytes Relative: 23 %
Neutro Abs: 1.4 10*3/uL — ABNORMAL LOW (ref 1.7–7.7)
Neutrophils Relative %: 47 %
Platelets: 98 10*3/uL — ABNORMAL LOW (ref 150–400)
RBC: 2.24 MIL/uL — ABNORMAL LOW (ref 4.22–5.81)
RDW: 25.2 % — ABNORMAL HIGH (ref 11.5–15.5)
WBC: 2.9 10*3/uL — ABNORMAL LOW (ref 4.0–10.5)
nRBC: 0 % (ref 0.0–0.2)

## 2022-01-31 LAB — SAMPLE TO BLOOD BANK

## 2022-01-31 LAB — MAGNESIUM: Magnesium: 2.1 mg/dL (ref 1.7–2.4)

## 2022-01-31 MED ORDER — ONDANSETRON HCL 4 MG/2ML IJ SOLN
8.0000 mg | Freq: Once | INTRAMUSCULAR | Status: AC
  Administered 2022-01-31: 8 mg via INTRAVENOUS
  Filled 2022-01-31: qty 4

## 2022-01-31 MED ORDER — ACETAMINOPHEN 325 MG PO TABS
650.0000 mg | ORAL_TABLET | Freq: Once | ORAL | Status: AC
  Administered 2022-01-31: 650 mg via ORAL
  Filled 2022-01-31: qty 2

## 2022-01-31 MED ORDER — METHYLPREDNISOLONE SODIUM SUCC 125 MG IJ SOLR
100.0000 mg | Freq: Once | INTRAMUSCULAR | Status: AC
  Administered 2022-01-31: 100 mg via INTRAVENOUS
  Filled 2022-01-31: qty 2

## 2022-01-31 MED ORDER — DIPHENHYDRAMINE HCL 25 MG PO CAPS
50.0000 mg | ORAL_CAPSULE | Freq: Once | ORAL | Status: AC
  Administered 2022-01-31: 50 mg via ORAL
  Filled 2022-01-31: qty 2

## 2022-01-31 MED ORDER — BORTEZOMIB CHEMO SQ INJECTION 3.5 MG (2.5MG/ML)
1.0000 mg/m2 | Freq: Once | INTRAMUSCULAR | Status: AC
  Administered 2022-01-31: 1.75 mg via SUBCUTANEOUS
  Filled 2022-01-31: qty 0.7

## 2022-01-31 MED ORDER — MONTELUKAST SODIUM 10 MG PO TABS
10.0000 mg | ORAL_TABLET | Freq: Once | ORAL | Status: AC
  Administered 2022-01-31: 10 mg via ORAL
  Filled 2022-01-31: qty 1

## 2022-01-31 MED ORDER — SODIUM CHLORIDE 0.9 % IV SOLN
16.0000 mg/kg | Freq: Once | INTRAVENOUS | Status: AC
  Administered 2022-01-31: 900 mg via INTRAVENOUS
  Filled 2022-01-31: qty 40

## 2022-01-31 MED ORDER — HEPARIN SOD (PORK) LOCK FLUSH 100 UNIT/ML IV SOLN
500.0000 [IU] | Freq: Once | INTRAVENOUS | Status: AC | PRN
  Administered 2022-01-31: 500 [IU]

## 2022-01-31 MED ORDER — SODIUM CHLORIDE 0.9 % IV SOLN: Freq: Once | INTRAVENOUS | Status: AC

## 2022-01-31 MED ORDER — SODIUM CHLORIDE 0.9% FLUSH
10.0000 mL | INTRAVENOUS | Status: DC | PRN
  Administered 2022-01-31: 10 mL

## 2022-01-31 NOTE — Progress Notes (Signed)
Patient presents today for Day 15 of IV dara/Velcade. ANC 1.4 Hemoglobin 7.5, patient okay for treatment today per Dr. Delton Coombes with no blood transfusion needed. Patient tolerated Velcade injection with no complaints voiced. Lab work reviewed. See MAR for details. Injection site clean and dry with no bruising or swelling noted. Patient stable during and after injection. Band aid applied.   Patient tolerated chemotherapy with no complaints voiced. Side effects with management reviewed understanding verbalized. Port site clean and dry with no bruising or swelling noted at site. Good blood return noted before and after administration of chemotherapy. Band aid applied. Patient left in satisfactory condition with VSS and no s/s of distress noted.

## 2022-01-31 NOTE — Patient Instructions (Signed)
[facility]  Discharge Instructions: Thank you for choosing [facility] to provide your oncology and hematology care.  If you have a lab appointment with the [facility], please come in thru the Main Entrance and check in at the main information desk.  Wear comfortable clothing and clothing appropriate for easy access to any Portacath or PICC line.   We strive to give you quality time with your provider. You may need to reschedule your appointment if you arrive late (15 or more minutes).  Arriving late affects you and other patients whose appointments are after yours.  Also, if you miss three or more appointments without notifying the office, you may be dismissed from the clinic at the provider's discretion.      For prescription refill requests, have your pharmacy contact our office and allow 72 hours for refills to be completed.    Today you received the following chemotherapy and/or immunotherapy agents Velcade and Daratumumab, return as scheduled.   To help prevent nausea and vomiting after your treatment, we encourage you to take your nausea medication as directed.  BELOW ARE SYMPTOMS THAT SHOULD BE REPORTED IMMEDIATELY: *FEVER GREATER THAN 100.4 F (38 C) OR HIGHER *CHILLS OR SWEATING *NAUSEA AND VOMITING THAT IS NOT CONTROLLED WITH YOUR NAUSEA MEDICATION *UNUSUAL SHORTNESS OF BREATH *UNUSUAL BRUISING OR BLEEDING *URINARY PROBLEMS (pain or burning when urinating, or frequent urination) *BOWEL PROBLEMS (unusual diarrhea, constipation, pain near the anus) TENDERNESS IN MOUTH AND THROAT WITH OR WITHOUT PRESENCE OF ULCERS (sore throat, sores in mouth, or a toothache) UNUSUAL RASH, SWELLING OR PAIN  UNUSUAL VAGINAL DISCHARGE OR ITCHING   Items with * indicate a potential emergency and should be followed up as soon as possible or go to the Emergency Department if any problems should occur.  Please show the CHEMOTHERAPY ALERT CARD or IMMUNOTHERAPY  ALERT CARD at check-in to the Emergency Department and triage nurse.  Should you have questions after your visit or need to cancel or reschedule your appointment, please contact [facility] [phone]  and follow the prompts.  Office hours are 8:00 a.m. to 4:30 p.m. Monday - Friday. Please note that voicemails left after 4:00 p.m. may not be returned until the following business day.  We are closed weekends and major holidays. You have access to a nurse at all times for urgent questions. Please call the main number to the clinic [phone] and follow the prompts.  For any non-urgent questions, you may also contact your provider using MyChart. We now offer e-Visits for anyone 23 and older to request care online for non-urgent symptoms. For details visit [email or web address].   Also download the MyChart app! Go to the app store, search "MyChart", open the app, select [facility], and log in with your MyChart username and password.  Masks are optional in the cancer centers. If you would like for your care team to wear a mask while they are taking care of you, please let them know. You may have one support person who is at least 86 years old accompany you for your appointments.

## 2022-01-31 NOTE — Progress Notes (Signed)
Patient has been assessed, vital signs and labs have been reviewed by Dr. Katragadda. ANC, Creatinine, LFTs, and Platelets are within treatment parameters per Dr. Katragadda. The patient is good to proceed with treatment at this time. Primary RN and pharmacy aware.  

## 2022-01-31 NOTE — Patient Instructions (Signed)
[facility]  Discharge Instructions  You were seen and examined today by Dr. Delton Coombes.  You may proceed with treatment today as planned.  Follow-up as scheduled.  Thank you for choosing [facility] to provide your oncology and hematology care.   To afford each patient quality time with our provider, please arrive at least 15 minutes before your scheduled appointment time. You may need to reschedule your appointment if you arrive late (10 or more minutes). Arriving late affects you and other patients whose appointments are after yours.  Also, if you miss three or more appointments without notifying the office, you may be dismissed from the clinic at the provider's discretion.    Again, thank you for choosing [facility].  Our hope is that these requests will decrease the amount of time that you wait before being seen by our physicians.   If you have a lab appointment with the [facility] please come in thru the Main Entrance and check in at the main information desk.           _____________________________________________________________  Should you have questions after your visit to [facility], please contact our office at [phone] and follow the prompts.  Our office hours are 8:00 a.m. to 4:30 p.m. Monday - Thursday and 8:00 a.m. to 2:30 p.m. Friday.  Please note that voicemails left after 4:00 p.m. may not be returned until the following business day.  We are closed weekends and all major holidays.  You do have access to a nurse 24-7, just call the main number to the clinic [phone] and do not press any options, hold on the line and a nurse will answer the phone.    For prescription refill requests, have your pharmacy contact our office and allow 72 hours.    Masks are optional in the cancer centers. If you would like for your care team to wear a mask while they are taking care of you, please let  them know. You may have one support person who is at least 86 years old accompany you for your appointments.

## 2022-01-31 NOTE — Progress Notes (Signed)
Dale [name], Dale [name] [id number]   CLINIC:  Medical Oncology/Hematology  PCP:  [facility], [place] / [place] [address] [phone]   REASON FOR VISIT:  Follow-up for multiple myeloma and anemia  PRIOR THERAPY: none  CURRENT THERAPY: Weekly Velcade and dexamethasone  BRIEF ONCOLOGIC HISTORY:  Oncology History  Multiple myeloma ([place])  01/15/2021 Initial Diagnosis   Multiple myeloma ([place])   06/25/2021 - 12/06/2021 Chemotherapy   Patient is on Treatment Plan : MYELOMA NON-TRANSPLANT CANDIDATES VRd weekly q21d     12/20/2021 -  Chemotherapy   Patient is on Treatment Plan : MYELOMA Daratumumab IV q28d       CANCER STAGING:  Cancer Staging  No matching staging information was found for the patient.  INTERVAL HISTORY:  [name], a 88 y.o. male, seen for follow-up of multiple myeloma.  His treatment was held last week as he was hospitalized from 01/16/2022 through 01/17/2022 for a syncopal episode which happened after the day of port placement.  He is feeling much better today.  He is accompanied by his son.  Energy levels are reported as 70%.  No further falls reported.  He is doing physical therapy at home.  He had sustained some skin abrasions when he fell at home health nurse is attending to it.  REVIEW OF SYSTEMS:  Review of Systems  All other systems reviewed and are negative.   PAST MEDICAL/SURGICAL HISTORY:  Past Medical History:  Diagnosis Date   Allergic rhinitis    Anal fissure    Anginal pain (HCC)    Asthma    Cancer (HCC)    skin   Chest pain    CHF (congestive heart failure) ([place])    Coronary heart disease    s/p stenting. cath in 01/2012 noncritical occlusion   Dyspnea    Dysrhythmia    1st degree heart block   GERD (gastroesophageal reflux disease)    Glaucoma    Hiatal hernia    Hyperlipidemia    Hypertension    Hypothyroidism    Idiopathic thrombocytopenic purpura ([place]) 2002    Macular degeneration    Nephrolithiasis    PUD (peptic ulcer disease)    remote   Sarcoidosis    pulmonary   Schatzki's ring    Past Surgical History:  Procedure Laterality Date   cardiac stents     COLONOSCOPY  10/30/2006   Normal rectum, sigmoid diverticula.Remainder of colonic mucosa appeared normal.   CORONARY ANGIOPLASTY WITH STENT PLACEMENT     about 10 years ago per pt (around 2007)   [place], URETEROSCOPY AND STENT PLACEMENT Left 06/16/2017   Procedure: CYSTOSCOPY WITH RETROGRADE PYELOGRAM, URETEROSCOPY,STONE EXTRACTION  AND STENT PLACEMENT;  Surgeon: Franchot Gallo, MD;  Location: [facility];  Service: Urology;  Laterality: Left;   ESOPHAGOGASTRODUODENOSCOPY  06/19/2004   Two esophageal rings and esophageal web as described above.  All of these were disrupted by passing 56-French Venia Minks dilator/ Candida esophagitis,which appears to be incidental given history of   antibiotic use, but nevertheless will be treated.   ESOPHAGOGASTRODUODENOSCOPY  10/30/2006   Distal tandem esophageal ring status post dilation disruption as  described above.  Otherwise normal esophagus/  Small hiatal hernia otherwise normal stomach, D1 and D2   ESOPHAGOGASTRODUODENOSCOPY N/A 03/22/2015   Dr.Rourk- noncritical schatzki's ring and hiatal hernia-o/w normal EGD.    ESOPHAGOGASTRODUODENOSCOPY (EGD) WITH ESOPHAGEAL DILATION  03/04/2012   RMR- schatzki's ring, hiatal hernia, polypoid  gastric mucosa, bx= minimally active gastritis.   HOLMIUM LASER APPLICATION Left 9/52/8413   Procedure: HOLMIUM LASER APPLICATION;  Surgeon: Franchot Gallo, MD;  Location: [facility];  Service: Urology;  Laterality: Left;   IR CV LINE INJECTION  12/27/2021   IR GENERIC HISTORICAL  03/06/2016   IR RADIOLOGIST EVAL & MGMT 03/06/2016 Aletta Edouard, MD [facility] INTERV RAD   IR GENERIC HISTORICAL  06/18/2016   IR RADIOLOGIST EVAL & MGMT 06/18/2016 Aletta Edouard, MD [facility] INTER[facility] RAD   IR IMAGING GUIDED  PORT INSERTION  12/18/2021   IR IMAGING GUIDED PORT INSERTION  01/15/2022   IR RADIOLOGIST EVAL & MGMT  10/01/2016   IR RADIOLOGIST EVAL & MGMT  10/15/2017   IR RADIOLOGIST EVAL & MGMT  12/24/2018   IR RADIOLOGIST EVAL & MGMT  01/04/2020   IR RADIOLOGIST EVAL & MGMT  06/20/2021   IR REMOVAL TUN ACCESS W/ PORT W/O FL MOD SED  01/15/2022   LEFT HEART CATH N/A 02/02/2012   Procedure: LEFT HEART CATH;  Surgeon: Lorretta Harp, MD;  Location: [facility] CATH LAB;  Service: Cardiovascular;  Laterality: N/A;   MEDIASTINOSCOPY     for dx sarcoid   RADIOLOGY WITH ANESTHESIA Left 05/17/2016   Procedure: left renal ablation;  Surgeon: Aletta Edouard, MD;  Location: [facility];  Service: Radiology;  Laterality: Left;    SOCIAL HISTORY:  Social History   Socioeconomic History   Marital status: Married    Spouse name: Not on file   Number of children: 1   Years of education: Not on file   Highest education level: Not on file  Occupational History   Occupation: Retired    Comment: [employer]: RETIRED  Tobacco Use   Smoking status: Former    Packs/day: 0.10    Years: 2.00    Total pack years: 0.20    Types: Cigarettes, Cigars    Quit date: 05/06/1970    Years since quitting: 51.7   Smokeless tobacco: Never  Vaping Use   Vaping Use: Never used  Substance and Sexual Activity   Alcohol use: No    Alcohol/week: 0.0 standard drinks of alcohol   Drug use: No   Sexual activity: Never  Other Topics Concern   Not on file  Social History Narrative   Not on file   Social Determinants of Health   Financial Resource Strain: Not on file  Food Insecurity: No Food Insecurity (01/17/2022)   Hunger Vital Sign    Worried About Running Out of Food in the Last Year: Never true    [place] in the Last Year: Never true  Transportation Needs: No Transportation Needs (01/17/2022)   PRAPARE - Hydrologist (Medical): No    Lack of Transportation (Non-Medical):  No  Physical Activity: Not on file  Stress: Not on file  Social Connections: Not on file  Intimate Partner Violence: Not At Risk (01/17/2022)   Humiliation, Afraid, Rape, and Kick questionnaire    Fear of Current or Ex-Partner: No    Emotionally Abused: No    Physically Abused: No    Sexually Abused: No    FAMILY HISTORY:  Family History  Problem Relation Age of Onset   Heart disease Father        deceased age 92   Stroke Mother    Alzheimer's disease Mother    Heart attack Brother        deceased age 39  Cancer Other        niece   Colon cancer Neg Hx     CURRENT MEDICATIONS:  Current Outpatient Medications  Medication Sig Dispense Refill   acetaminophen (TYLENOL) 325 MG tablet Take 2 tablets (650 mg total) by mouth every 6 (six) hours as needed for mild pain (or Fever >/= 101).     acyclovir (ZOVIRAX) 400 MG tablet Take 1 tablet (400 mg total) by mouth 2 (two) times daily. 180 tablet 0   Ascorbic Acid (VITAMIN C PO) Take 500 mg by mouth every evening.     atorvastatin (LIPITOR) 40 MG tablet TAKE 1 TABLET BY MOUTH IN  THE EVENING (Patient taking differently: Take 40 mg by mouth daily.) 90 tablet 3   Bortezomib (VELCADE IJ) Inject as directed once a week.     brimonidine (ALPHAGAN) 0.2 % ophthalmic solution Place 1 drop into the left eye 2 (two) times daily.     cyanocobalamin 1000 MCG tablet Take 1 tablet (1,000 mcg total) by mouth daily. 30 tablet 3   dexamethasone (DECADRON) 2 MG tablet TAKE 5 TABLETS BY MOUTH ONCE  WEEKLY 60 tablet 0   dorzolamide-timolol (COSOPT) 22.3-6.8 MG/ML ophthalmic solution Place 1 drop into the left eye 2 (two) times daily.     feeding supplement (ENSURE ENLIVE / ENSURE PLUS) LIQD Take 237 mLs by mouth 3 (three) times daily between meals.     furosemide (LASIX) 20 MG tablet If you notice any swelling, you may take 40 mg (two tablets) for the day. (Patient taking differently: Take 20 mg by mouth 2 (two) times daily.) 90 tablet 3   isosorbide  mononitrate (IMDUR) 60 MG 24 hr tablet Take 1 tablet (60 mg total) by mouth daily. TAKE 1 AND 1/2 TABLETS BY  MOUTH IN THE MORNING AND  1/2 TABLET AT NIGHT     levothyroxine (SYNTHROID) 75 MCG tablet Take 75 mcg by mouth daily before breakfast.     losartan (COZAAR) 100 MG tablet TAKE 1 TABLET BY MOUTH  DAILY (Patient taking differently: Take 100 mg by mouth daily.) 90 tablet 1   metoprolol tartrate (LOPRESSOR) 25 MG tablet TAKE 1 TABLET BY MOUTH IN  THE MORNING AND ONE-HALF  TABLET BY MOUTH IN THE  EVENING (Patient taking differently: Take 12.5-25 mg by mouth in the morning and at bedtime. TAKE 1 TABLET BY MOUTH IN  THE MORNING AND ONE-HALF  TABLET BY MOUTH IN THE  EVENING) 135 tablet 3   Multiple Vitamins-Minerals (PRESERVISION/LUTEIN) CAPS Take 1 capsule by mouth 2 (two) times daily.     pantoprazole (PROTONIX) 40 MG tablet TAKE 1 TABLET BY MOUTH DAILY  BEFORE BREAKFAST 90 tablet 3   potassium chloride SA (KLOR-CON M) 20 MEQ tablet TAKE 1 TABLET BY MOUTH  DAILY (Patient taking differently: Take 20 mEq by mouth daily.) 90 tablet 3   ROCKLATAN 0.02-0.005 % SOLN Place 1 drop into the left eye at bedtime.     triamcinolone (KENALOG) 0.1 % cream Apply 1 application. topically 2 (two) times daily as needed (for irritation).     vitamin E 200 UNIT capsule Take 200 Units by mouth every evening.     lidocaine-prilocaine (EMLA) cream Apply 1 Application topically as needed. (Patient not taking: Reported on 01/31/2022) 30 g 1   nitroGLYCERIN (NITROSTAT) 0.4 MG SL tablet Place 1 tablet (0.4 mg total) under the tongue every 5 (five) minutes as needed. For chest pain (Patient not taking: Reported on 01/31/2022) 25 tablet 2   prochlorperazine (  COMPAZINE) 10 MG tablet Take 1 tablet (10 mg total) by mouth every 6 (six) hours as needed for nausea or vomiting. (Patient not taking: Reported on 01/31/2022) 30 tablet 3   No current facility-administered medications for this visit.   Facility-Administered Medications  Ordered in Other Visits  Medication Dose Route Frequency Provider Last Rate Last Admin   acetaminophen (TYLENOL) 325 MG tablet            diphenhydrAMINE (BENADRYL) 25 mg capsule            montelukast (SINGULAIR) 10 MG tablet            sodium chloride flush (NS) 0.9 % injection 10 mL  10 mL Intracatheter PRN Derek Jack, MD   10 mL at 01/31/22 1440    ALLERGIES:  Allergies  Allergen Reactions   Azithromycin Other (See Comments)    Sore mouth and fever blisters around mouth, sores in nose area as well   Doxazosin Shortness Of Breath   Atenolol Other (See Comments)    UNKNOWN REACTION   Hydrocodone Nausea And Vomiting   Levofloxacin Other (See Comments)    Caused stomach problems.   Morphine Other (See Comments)    "made me crazy"   Penicillins Nausea And Vomiting and Other (See Comments)    Has patient had a PCN reaction causing immediate rash, facial/tongue/throat swelling, SOB or lightheadedness with hypotension: No Has patient had a PCN reaction causing severe rash involving mucus membranes or skin necrosis: No Has patient had a PCN reaction that required hospitalization No Has patient had a PCN reaction occurring within the last 10 years: No If all of the above answers are "NO", then may proceed with Cephalosporin use.    Sulfonamide Derivatives Nausea And Vomiting    PHYSICAL EXAM:  Performance status (ECOG): 0 - Asymptomatic  There were no vitals filed for this visit.  Wt Readings from Last 3 Encounters:  01/31/22 120 lb 11.2 oz (54.7 kg)  01/24/22 121 lb 1.6 oz (54.9 kg)  01/22/22 127 lb (57.6 kg)   Physical Exam Vitals reviewed.  Constitutional:      Appearance: Normal appearance.  Cardiovascular:     Rate and Rhythm: Normal rate and regular rhythm.     Pulses: Normal pulses.     Heart sounds: Normal heart sounds.  Pulmonary:     Effort: Pulmonary effort is normal.     Breath sounds: Normal breath sounds.  Neurological:     General: No focal  deficit present.     Mental Status: He is alert and oriented to person, place, and time.  Psychiatric:        Mood and Affect: Mood normal.        Behavior: Behavior normal.     LABORATORY DATA:  I have reviewed the labs as listed.     Latest Ref Rng & Units 01/31/2022    7:49 AM 01/24/2022    8:24 AM 01/17/2022    4:30 PM  CBC  WBC 4.0 - 10.5 K/uL 2.9  3.0    Hemoglobin 13.0 - 17.0 g/dL 7.5  6.5  8.1   Hematocrit 39.0 - 52.0 % 22.9  20.1  23.8   Platelets 150 - 400 K/uL 98  51        Latest Ref Rng & Units 01/31/2022    8:13 AM 01/24/2022    8:24 AM 01/17/2022    6:06 AM  CMP  Glucose 70 - 99 mg/dL 112  99  95  BUN 8 - 23 mg/dL _0 Creatinine 0.61 - 1.24 mg/dL 0.83  0.85  0.84   Sodium 135 - 145 mmol/L 131  130  132   Potassium 3.5 - 5.1 mmol/L 3.8  3.9  3.7   Chloride 98 - 111 mmol/L 105  102  104   CO2 22 - 32 mmol/L _1 Calcium 8.9 - 10.3 mg/dL 8.5  8.4  8.4   Total Protein 6.5 - 8.1 g/dL 8.6  8.2    Total Bilirubin 0.3 - 1.2 mg/dL 0.7  0.9    Alkaline Phos 38 - 126 U/L 61  49    AST 15 - 41 U/L 22  17    ALT 0 - 44 U/L 22  22      DIAGNOSTIC IMAGING:  I have independently reviewed the scans and discussed with the patient. CT CHEST ABDOMEN PELVIS W CONTRAST  Result Date: 01/16/2022 CLINICAL DATA:  Trauma, fall EXAM: CT CHEST, ABDOMEN, AND PELVIS WITH CONTRAST TECHNIQUE: Multidetector CT imaging of the chest, abdomen and pelvis was performed following the standard protocol during bolus administration of intravenous contrast. RADIATION DOSE REDUCTION: This exam was performed according to the departmental dose-optimization program which includes automated exposure control, adjustment of the mA and/or kV according to patient size and/or use of iterative reconstruction technique. CONTRAST:  28m OMNIPAQUE IOHEXOL 300 MG/ML  SOLN COMPARISON:  Previous studies including the CT abdomen done on 06/14/2021 FINDINGS: CT CHEST FINDINGS Cardiovascular: There is  homogeneous enhancement in thoracic aorta. There are no intraluminal filling defects in central pulmonary artery branches. There is ectasia of the main pulmonary artery measuring 3.4 cm suggesting pulmonary arterial hypertension. Coronary artery calcifications are seen. Mediastinum/Nodes: There is no mediastinal hematoma. Left IJ chest port is noted with the tip of the catheter in superior vena cava. There are pockets of air in subcutaneous plane in the anterior chest, more so on the left side, possibly suggesting recent accessing of the chest port. There is a 2.4 cm loculated air containing structure in subcutaneous plane in the right anterior chest wall. Lungs/Pleura: Centrilobular and panlobular emphysema is seen. Increased interstitial markings are seen in the posterior segment of right upper lobe and in posterior right lower lobe. There is ectasia bronchi in the area of increased interstitial markings. Findings may suggest scarring or interstitial pneumonia. There are linear densities in right middle lobe and lingula suggesting scarring or subsegmental atelectasis. Minimal left pleural effusion is seen. There is no pneumothorax. Musculoskeletal: No acute findings are seen in bony structures in the thorax. CT ABDOMEN PELVIS FINDINGS Hepatobiliary: There are few low-density lesions in liver largest measuring 3.4 cm in the left lobe, possibly cysts. There is no dilation of bile ducts. Gallbladder stones are seen. There is no wall thickening in gallbladder. Pancreas: No focal abnormalities are seen. Spleen: Unremarkable. Adrenals/Urinary Tract: Adrenals are unremarkable. There is no hydronephrosis. There is 2 mm calculus in the right kidney. There is 1 mm calculus in the lower pole of left kidney. There is focal cortical thinning in the lateral aspect of left kidney, possibly related to previous intervention or pyelonephritis. There are no ureteral stones. Urinary bladder is unremarkable. Stomach/Bowel: Stomach is  unremarkable. Small bowel loops are not dilated. Appendix is not dilated. There is no significant wall thickening in colon. There is no pericardial stranding. Diverticula are seen in the colon without signs of focal acute diverticulitis. Vascular/Lymphatic: Scattered calcifications are seen in  aorta and its major branches. No significant lymphadenopathy is seen. Reproductive: Unremarkable. Other: There is no ascites or pneumoperitoneum. Right inguinal hernia containing fat is seen. Musculoskeletal: No recent fracture is seen in bony structures. IMPRESSION: No acute findings are seen in CT scan of chest, abdomen and pelvis. There is no focal pulmonary consolidation. There is no pleural effusion or pneumothorax. There is no evidence of mediastinal hematoma. Coronary artery disease. Ectasia of the main pulmonary artery suggests pulmonary arterial hypertension. There is no laceration in the solid organs. There is no bowel wall thickening. There is no ascites or pneumoperitoneum. No displaced fractures are seen in bony structures. There is loculated pocket of air in the right anterior chest wall, most likely suggesting recent removal of right chest port. There are pockets of air adjacent to left IJ chest port suggesting recent placement of left chest port. COPD. There are scattered foci of increased interstitial markings, especially in posterior segment of right upper lobe and superior segment of right lower lobe suggesting scarring. Hepatic cysts. Small bilateral renal stones. Diverticulosis of colon. Other findings as described in the body of the report. Electronically Signed   By: Elmer Picker M.D.   On: 01/16/2022 17:30   CT Head Wo Contrast  Result Date: 01/16/2022 CLINICAL DATA:  Fall, neck trauma EXAM: CT HEAD WITHOUT CONTRAST CT CERVICAL SPINE WITHOUT CONTRAST TECHNIQUE: Multidetector CT imaging of the head and cervical spine was performed following the standard protocol without intravenous contrast.  Multiplanar CT image reconstructions of the cervical spine were also generated. RADIATION DOSE REDUCTION: This exam was performed according to the departmental dose-optimization program which includes automated exposure control, adjustment of the mA and/or kV according to patient size and/or use of iterative reconstruction technique. COMPARISON:  CT head 01/04/2021 FINDINGS: CT HEAD FINDINGS Brain: Generalized atrophy without hydrocephalus. Patchy white matter hypodensity bilaterally unchanged. Negative for acute infarct, hemorrhage, mass Vascular: Negative for hyperdense vessel Skull: Negative calvarium Sinuses/Orbits: Expansile soft tissue mass in the left maxillary antrum is unchanged. This extends into the right nasal cavity. No aggressive bony destruction. Other: None CT CERVICAL SPINE FINDINGS Alignment: Normal Skull base and vertebrae: Negative for fracture or mass Soft tissues and spinal canal: No soft tissue mass or adenopathy. Atherosclerotic calcification of the carotid bifurcation bilaterally left jugular vascular catheter with gas in the soft tissues likely due to recent placement. Disc levels: Mild disc degeneration. Moderate bilateral facet degeneration. Upper chest: Apical emphysema without acute abnormality Other: None IMPRESSION: 1. No acute intracranial injury. 2. Negative for cervical spine fracture 3. Expansile soft tissue mass in the left maxillary sinus extending into the left nasal cavity. This is unchanged from CT of 01/04/2021. 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 Electronically Signed   By: Franchot Gallo M.D.   On: 01/16/2022 17:17   CT Cervical Spine Wo Contrast  Result Date: 01/16/2022 CLINICAL DATA:  Fall, neck trauma EXAM: CT HEAD WITHOUT CONTRAST CT CERVICAL SPINE WITHOUT CONTRAST TECHNIQUE: Multidetector CT imaging of the head and cervical spine was performed following the standard protocol without intravenous contrast. Multiplanar CT image reconstructions of the cervical spine were  also generated. RADIATION DOSE REDUCTION: This exam was performed according to the departmental dose-optimization program which includes automated exposure control, adjustment of the mA and/or kV according to patient size and/or use of iterative reconstruction technique. COMPARISON:  CT head 01/04/2021 FINDINGS: CT HEAD FINDINGS Brain: Generalized atrophy without hydrocephalus. Patchy white matter hypodensity bilaterally unchanged. Negative for acute infarct, hemorrhage, mass Vascular: Negative for hyperdense vessel Skull:  Negative calvarium Sinuses/Orbits: Expansile soft tissue mass in the left maxillary antrum is unchanged. This extends into the right nasal cavity. No aggressive bony destruction. Other: None CT CERVICAL SPINE FINDINGS Alignment: Normal Skull base and vertebrae: Negative for fracture or mass Soft tissues and spinal canal: No soft tissue mass or adenopathy. Atherosclerotic calcification of the carotid bifurcation bilaterally left jugular vascular catheter with gas in the soft tissues likely due to recent placement. Disc levels: Mild disc degeneration. Moderate bilateral facet degeneration. Upper chest: Apical emphysema without acute abnormality Other: None IMPRESSION: 1. No acute intracranial injury. 2. Negative for cervical spine fracture 3. Expansile soft tissue mass in the left maxillary sinus extending into the left nasal cavity. This is unchanged from CT of 01/04/2021. 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 Electronically Signed   By: Franchot Gallo M.D.   On: 01/16/2022 17:17   IR IMAGING GUIDED PORT INSERTION  Result Date: 01/15/2022 INDICATION: Port malfunction on recently placed port, 12/18/2021. Hub detachment. EXAM: Procedures: 1. IMPLANTED PORT A CATH PLACEMENT WITH ULTRASOUND AND FLUOROSCOPIC GUIDANCE 2. REMOVAL OF IMPLANTED TUNNELED PORT-A-CATH MEDICATIONS: Ancef 2 gm IV; The antibiotic was administered within an appropriate time interval prior to skin puncture. ANESTHESIA/SEDATION: Moderate  (conscious) sedation was employed during this procedure. A total of Versed 2 mg and Fentanyl 100 mcg was administered intravenously. Moderate Sedation Time: 58 minutes. The patient's level of consciousness and vital signs were monitored continuously by radiology nursing throughout the procedure under my direct supervision. FLUOROSCOPY TIME:  Fluoroscopic dose; 2 mGy COMPLICATIONS: None immediate. PROCEDURE: The procedure, risks, benefits, and alternatives were explained to the patient and/or patient's representative . Questions regarding the procedure were encouraged and answered. The patient and/or patient's representative understands and consents to the procedure. PORT PLACEMENT; The LEFT neck and chest was prepped with chlorhexidine in a sterile fashion, and a sterile drape was applied covering the operative field. Maximum barrier sterile technique with sterile gowns and gloves were used for the procedure. A timeout was performed prior to the initiation of the procedure. Local anesthesia was provided with 1% lidocaine with epinephrine. After creating a small venotomy incision, a micropuncture kit was utilized to access the internal jugular vein under direct, real-time ultrasound guidance. Ultrasound image documentation was performed. The microwire was kinked to measure appropriate catheter length. A subcutaneous port pocket was then created along the upper chest wall utilizing a combination of sharp and blunt dissection. The pocket was irrigated with sterile saline. A single lumen Non-ISP power injectable port was chosen for placement. The 8 Fr catheter was tunneled from the port pocket site to the venotomy incision. The port was placed in the pocket. The external catheter was trimmed to appropriate length. At the venotomy, an 8 Fr peel-away sheath was placed over a guidewire under fluoroscopic guidance. The catheter was then placed through the sheath and the sheath was removed. Final catheter positioning was  confirmed and documented with a fluoroscopic spot radiograph. The port was accessed with a Huber needle, aspirated and flushed with heparinized saline. The port pocket incision was closed with interrupted 3-0 Vicryl suture then Dermabond was applied, including at the venotomy incision. Dressings were placed. PORT REMOVAL; Attention was then directed to the RIGHT chest and previously placed Port-A-Cath. The site had been prepped with chlorhexidine. Local anesthesia was provided with 1% lidocaine with epinephrine. A timeout was performed prior to the initiation of the procedure. An incision was made overlying the Port-A-Cath with a scalpel. Utilizing sharp and blunt dissection, the Port-A-Cath was removed completely.  The pocked was irrigated with sterile saline. Wound closure was performed with interrupted subcutaneous 2-0 Vicryl sutures then Dermabond was applied at the skin. Dressings were applied. The patient tolerated the procedures well without immediate post procedural complication. IMPRESSION: 1. Successful placement of a LEFT chest internal jugular approach power injectable Port-A-Cath. The tip of the catheter is positioned within the superior cavoatrial junction. The catheter is ready for immediate use. 2. Successful removal of implanted RIGHT chest Port-A-Cath without immediate post procedural complication. Michaelle Birks, MD [facility] Radiology Electronically Signed   By: Michaelle Birks M.D.   On: 01/15/2022 23:15   IR REMOVAL TUN ACCESS W/ PORT W/O FL MOD SED  Result Date: 01/15/2022 INDICATION: Port malfunction on recently placed port, 12/18/2021. Hub detachment. EXAM: Procedures: 1. IMPLANTED PORT A CATH PLACEMENT WITH ULTRASOUND AND FLUOROSCOPIC GUIDANCE 2. REMOVAL OF IMPLANTED TUNNELED PORT-A-CATH MEDICATIONS: Ancef 2 gm IV; The antibiotic was administered within an appropriate time interval prior to skin puncture. ANESTHESIA/SEDATION: Moderate  (conscious) sedation was employed during this procedure. A total of Versed 2 mg and Fentanyl 100 mcg was administered intravenously. Moderate Sedation Time: 58 minutes. The patient's level of consciousness and vital signs were monitored continuously by radiology nursing throughout the procedure under my direct supervision. FLUOROSCOPY TIME:  Fluoroscopic dose; 2 mGy COMPLICATIONS: None immediate. PROCEDURE: The procedure, risks, benefits, and alternatives were explained to the patient and/or patient's representative . Questions regarding the procedure were encouraged and answered. The patient and/or patient's representative understands and consents to the procedure. PORT PLACEMENT; The LEFT neck and chest was prepped with chlorhexidine in a sterile fashion, and a sterile drape was applied covering the operative field. Maximum barrier sterile technique with sterile gowns and gloves were used for the procedure. A timeout was performed prior to the initiation of the procedure. Local anesthesia was provided with 1% lidocaine with epinephrine. After creating a small venotomy incision, a micropuncture kit was utilized to access the internal jugular vein under direct, real-time ultrasound guidance. Ultrasound image documentation was performed. The microwire was kinked to measure appropriate catheter length. A subcutaneous port pocket was then created along the upper chest wall utilizing a combination of sharp and blunt dissection. The pocket was irrigated with sterile saline. A single lumen Non-ISP power injectable port was chosen for placement. The 8 Fr catheter was tunneled from the port pocket site to the venotomy incision. The port was placed in the pocket. The external catheter was trimmed to appropriate length. At the venotomy, an 8 Fr peel-away sheath was placed over a guidewire under fluoroscopic guidance. The catheter was then placed through the sheath and the sheath was removed. Final catheter positioning was  confirmed and documented with a fluoroscopic spot radiograph. The port was accessed with a Huber needle, aspirated and flushed with heparinized saline. The port pocket incision was closed with interrupted 3-0 Vicryl suture then Dermabond was applied, including at the venotomy incision. Dressings were placed. PORT REMOVAL; Attention was then directed to the RIGHT chest and previously placed Port-A-Cath. The site had been prepped with chlorhexidine. Local anesthesia was provided with 1% lidocaine with epinephrine. A timeout was performed prior to the initiation of the procedure. An incision was made overlying the Port-A-Cath with a scalpel. Utilizing sharp and blunt dissection, the Port-A-Cath was removed completely. The pocked was irrigated with sterile saline. Wound closure was performed with interrupted subcutaneous 2-0 Vicryl sutures then Dermabond was applied at the skin. Dressings were applied. The patient tolerated the procedures  well without immediate post procedural complication. IMPRESSION: 1. Successful placement of a LEFT chest internal jugular approach power injectable Port-A-Cath. The tip of the catheter is positioned within the superior cavoatrial junction. The catheter is ready for immediate use. 2. Successful removal of implanted RIGHT chest Port-A-Cath without immediate post procedural complication. Michaelle Birks, MD [facility] Radiology Electronically Signed   By: Michaelle Birks M.D.   On: 01/15/2022 23:15     ASSESSMENT:  1.  IgG lambda plasma cell myeloma: - Work-up for macrocytic anemia on 12/01/2020 showed M spike of 2.9 g.  Immunofixation IgG lambda. - Lambda light chains elevated at 284.  Light chain ratio was 0.04.  LDH was 163.  Creatinine was 0.9 and calcium 8.9. - Bone marrow biopsy on 12/18/2020-hypercellular marrow for age with 48% atypical plasma cells with lambda light chain restriction. - Chromosome analysis was normal. 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 - Revlimid 5 mg, 2 weeks on/1 week off started on 01/20/2021.  Dexamethasone weekly 10 mg added on 02/07/2021.  Revlimid is discontinued around 06/15/2021 due to poor tolerance and ineffectiveness at low-dose.  Thrombocytopenia and anemia. - Velcade weekly on days 1, 8, 15 every 21 days along with dexamethasone 10 mg weekly started on 06/25/2021.  2.  Macrocytic anemia: - CBC on 12/01/2020 with hemoglobin 8.5 and MCV of 117. - Denies any bleeding per rectum or melena.  3.  Social/family history: - Lives at home with his wife.  He does all ADLs and IADLs.  He even does yard work. - He worked at [employer] for 35 years.  Denies any chemical exposure.  Non-smoker. - No family history of malignancies.   PLAN:  1.  IgG lambda plasma cell myeloma, high risk: - His last Darzalex and Velcade was on 01/10/2022. - Treatment last week was held as he had syncopal episode on 01/16/2022 and was admitted to the hospital through 01/17/2022.  I have reviewed records. - Syncopal episode likely happened (according to his son) as he did not eat anything for the whole day while he was awaiting port placement on 01/15/2022. - Reviewed labs today which showed white count 2.9 with ANC of 1.4.  Platelet count is 98.  Hemoglobin 7.5.  LFTs are normal.  Creatinine is 0.8. - Proceed with Darzalex and Velcade today and next week.  RTC 2 weeks for follow-up.   2.  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  Hemoglobin is 7.5 today after receiving 1 unit PRBC last week..  3.  Thromboprophylaxis: - Continue aspirin 81 mg daily.  4.  Right ankle swelling: - Continue Lasix as needed.  5.  ID prophylaxis: - Continue acyclovir twice daily.   Orders placed this encounter:  Orders Placed This Encounter  Procedures   CBC with Differential   CBC with  Differential       Derek Jack, MD [facility] [phone]   I, Thana Ates, am acting as a scribe for Dr. Derek Jack.  I, Derek Jack MD, have reviewed the above documentation for accuracy and completeness, and I agree with the above.

## 2022-02-01 ENCOUNTER — Telehealth: Payer: Self-pay

## 2022-02-01 NOTE — Telephone Encounter (Signed)
215 pm.  Return call made to wife Joy.  She is asking when a nurse will be coming to change the patient's dressings.  [facility] does not do dressing and she likely has home health.  Wife was able to find a folder that has [place].  Phone call made to [employer] in [place].  Spoke with Lelon Frohlich who advised she has spoken to patient as well.

## 2022-02-02 DIAGNOSIS — D509 Iron deficiency anemia, unspecified: Secondary | ICD-10-CM | POA: Diagnosis not present

## 2022-02-02 DIAGNOSIS — D6481 Anemia due to antineoplastic chemotherapy: Secondary | ICD-10-CM | POA: Diagnosis not present

## 2022-02-02 DIAGNOSIS — I110 Hypertensive heart disease with heart failure: Secondary | ICD-10-CM | POA: Diagnosis not present

## 2022-02-02 DIAGNOSIS — C9000 Multiple myeloma not having achieved remission: Secondary | ICD-10-CM | POA: Diagnosis not present

## 2022-02-02 DIAGNOSIS — I509 Heart failure, unspecified: Secondary | ICD-10-CM | POA: Diagnosis not present

## 2022-02-02 DIAGNOSIS — T451X5D Adverse effect of antineoplastic and immunosuppressive drugs, subsequent encounter: Secondary | ICD-10-CM | POA: Diagnosis not present

## 2022-02-05 DIAGNOSIS — T451X5D Adverse effect of antineoplastic and immunosuppressive drugs, subsequent encounter: Secondary | ICD-10-CM | POA: Diagnosis not present

## 2022-02-05 DIAGNOSIS — I509 Heart failure, unspecified: Secondary | ICD-10-CM | POA: Diagnosis not present

## 2022-02-05 DIAGNOSIS — C9000 Multiple myeloma not having achieved remission: Secondary | ICD-10-CM | POA: Diagnosis not present

## 2022-02-05 DIAGNOSIS — D509 Iron deficiency anemia, unspecified: Secondary | ICD-10-CM | POA: Diagnosis not present

## 2022-02-05 DIAGNOSIS — I110 Hypertensive heart disease with heart failure: Secondary | ICD-10-CM | POA: Diagnosis not present

## 2022-02-05 DIAGNOSIS — D6481 Anemia due to antineoplastic chemotherapy: Secondary | ICD-10-CM | POA: Diagnosis not present

## 2022-02-05 NOTE — Progress Notes (Signed)
Dr. Delton Coombes made aware of results.  No orders given.

## 2022-02-06 ENCOUNTER — Other Ambulatory Visit: Payer: Self-pay

## 2022-02-06 ENCOUNTER — Emergency Department (HOSPITAL_COMMUNITY): Payer: Medicare Other

## 2022-02-06 ENCOUNTER — Encounter (HOSPITAL_COMMUNITY): Payer: Self-pay | Admitting: Emergency Medicine

## 2022-02-06 ENCOUNTER — Observation Stay (HOSPITAL_COMMUNITY)
Admission: EM | Admit: 2022-02-06 | Discharge: 2022-02-08 | Disposition: A | Discharge status: Home / Self Care | Payer: Medicare Other | Attending: Internal Medicine | Admitting: Internal Medicine

## 2022-02-06 ENCOUNTER — Telehealth: Payer: Self-pay | Admitting: *Deleted

## 2022-02-06 DIAGNOSIS — D509 Iron deficiency anemia, unspecified: Secondary | ICD-10-CM | POA: Diagnosis not present

## 2022-02-06 DIAGNOSIS — I10 Essential (primary) hypertension: Secondary | ICD-10-CM | POA: Diagnosis present

## 2022-02-06 DIAGNOSIS — Z87891 Personal history of nicotine dependence: Secondary | ICD-10-CM | POA: Diagnosis not present

## 2022-02-06 DIAGNOSIS — D6481 Anemia due to antineoplastic chemotherapy: Secondary | ICD-10-CM | POA: Insufficient documentation

## 2022-02-06 DIAGNOSIS — Z79899 Other long term (current) drug therapy: Secondary | ICD-10-CM | POA: Diagnosis not present

## 2022-02-06 DIAGNOSIS — R0789 Other chest pain: Principal | ICD-10-CM | POA: Insufficient documentation

## 2022-02-06 DIAGNOSIS — R079 Chest pain, unspecified: Secondary | ICD-10-CM | POA: Diagnosis present

## 2022-02-06 DIAGNOSIS — T451X5A Adverse effect of antineoplastic and immunosuppressive drugs, initial encounter: Secondary | ICD-10-CM | POA: Diagnosis not present

## 2022-02-06 DIAGNOSIS — Z7982 Long term (current) use of aspirin: Secondary | ICD-10-CM

## 2022-02-06 DIAGNOSIS — Z85828 Personal history of other malignant neoplasm of skin: Secondary | ICD-10-CM | POA: Diagnosis not present

## 2022-02-06 DIAGNOSIS — I110 Hypertensive heart disease with heart failure: Secondary | ICD-10-CM | POA: Diagnosis not present

## 2022-02-06 DIAGNOSIS — E871 Hypo-osmolality and hyponatremia: Secondary | ICD-10-CM | POA: Diagnosis not present

## 2022-02-06 DIAGNOSIS — J45909 Unspecified asthma, uncomplicated: Secondary | ICD-10-CM | POA: Diagnosis not present

## 2022-02-06 DIAGNOSIS — J9811 Atelectasis: Secondary | ICD-10-CM | POA: Diagnosis not present

## 2022-02-06 DIAGNOSIS — T451X5D Adverse effect of antineoplastic and immunosuppressive drugs, subsequent encounter: Secondary | ICD-10-CM | POA: Diagnosis not present

## 2022-02-06 DIAGNOSIS — Z955 Presence of coronary angioplasty implant and graft: Secondary | ICD-10-CM | POA: Diagnosis not present

## 2022-02-06 DIAGNOSIS — K219 Gastro-esophageal reflux disease without esophagitis: Secondary | ICD-10-CM | POA: Diagnosis not present

## 2022-02-06 DIAGNOSIS — E039 Hypothyroidism, unspecified: Secondary | ICD-10-CM | POA: Diagnosis not present

## 2022-02-06 DIAGNOSIS — E782 Mixed hyperlipidemia: Secondary | ICD-10-CM | POA: Diagnosis not present

## 2022-02-06 DIAGNOSIS — C9000 Multiple myeloma not having achieved remission: Secondary | ICD-10-CM | POA: Diagnosis not present

## 2022-02-06 DIAGNOSIS — I2510 Atherosclerotic heart disease of native coronary artery without angina pectoris: Secondary | ICD-10-CM | POA: Diagnosis not present

## 2022-02-06 DIAGNOSIS — Z952 Presence of prosthetic heart valve: Secondary | ICD-10-CM | POA: Diagnosis not present

## 2022-02-06 DIAGNOSIS — R7989 Other specified abnormal findings of blood chemistry: Secondary | ICD-10-CM

## 2022-02-06 DIAGNOSIS — D61818 Other pancytopenia: Secondary | ICD-10-CM | POA: Diagnosis not present

## 2022-02-06 DIAGNOSIS — I509 Heart failure, unspecified: Secondary | ICD-10-CM | POA: Insufficient documentation

## 2022-02-06 LAB — COMPREHENSIVE METABOLIC PANEL
ALT: 24 U/L (ref 0–44)
AST: 20 U/L (ref 15–41)
Albumin: 2.6 g/dL — ABNORMAL LOW (ref 3.5–5.0)
Alkaline Phosphatase: 60 U/L (ref 38–126)
Anion gap: 4 — ABNORMAL LOW (ref 5–15)
BUN: 19 mg/dL (ref 8–23)
CO2: 24 mmol/L (ref 22–32)
Calcium: 8.4 mg/dL — ABNORMAL LOW (ref 8.9–10.3)
Chloride: 104 mmol/L (ref 98–111)
Creatinine, Ser: 0.85 mg/dL (ref 0.61–1.24)
GFR, Estimated: >60 mL/min (ref >60)
Glucose, Bld: 106 mg/dL — ABNORMAL HIGH (ref 70–99)
Potassium: 4.2 mmol/L (ref 3.5–5.1)
Sodium: 132 mmol/L — ABNORMAL LOW (ref 135–145)
Total Bilirubin: 0.4 mg/dL (ref 0.3–1.2)
Total Protein: 8.6 g/dL — ABNORMAL HIGH (ref 6.5–8.1)

## 2022-02-06 LAB — CBC
HCT: 22 % — ABNORMAL LOW (ref 39.0–52.0)
Hemoglobin: 7.1 g/dL — ABNORMAL LOW (ref 13.0–17.0)
MCH: 34 pg (ref 26.0–34.0)
MCHC: 32.3 g/dL (ref 30.0–36.0)
MCV: 105.3 fL — ABNORMAL HIGH (ref 80.0–100.0)
Platelets: 62 10*3/uL — ABNORMAL LOW (ref 150–400)
RBC: 2.09 MIL/uL — ABNORMAL LOW (ref 4.22–5.81)
RDW: 26.5 % — ABNORMAL HIGH (ref 11.5–15.5)
WBC: 2 10*3/uL — ABNORMAL LOW (ref 4.0–10.5)
nRBC: 0 % (ref 0.0–0.2)

## 2022-02-06 LAB — D-DIMER, QUANTITATIVE: D-Dimer, Quant: 1.3 ug/mL-FEU — ABNORMAL HIGH (ref 0.00–0.50)

## 2022-02-06 LAB — TROPONIN I (HIGH SENSITIVITY)
Troponin I (High Sensitivity): 3 ng/L (ref <18)
Troponin I (High Sensitivity): 3 ng/L (ref <18)

## 2022-02-06 MED ORDER — IOHEXOL 350 MG/ML SOLN
100.0000 mL | Freq: Once | INTRAVENOUS | Status: AC | PRN
  Administered 2022-02-06: 75 mL via INTRAVENOUS

## 2022-02-06 MED ORDER — ASPIRIN 81 MG PO CHEW
CHEWABLE_TABLET | ORAL | Status: AC
  Filled 2022-02-06: qty 1

## 2022-02-06 MED ORDER — NITROGLYCERIN 2 % TD OINT
1.0000 [in_us] | TOPICAL_OINTMENT | Freq: Once | TRANSDERMAL | Status: AC
  Administered 2022-02-06: 1 [in_us] via TOPICAL
  Filled 2022-02-06: qty 1

## 2022-02-06 MED ORDER — ASPIRIN 81 MG PO CHEW
324.0000 mg | CHEWABLE_TABLET | Freq: Once | ORAL | Status: AC
  Administered 2022-02-06: 324 mg via ORAL
  Filled 2022-02-06: qty 4

## 2022-02-06 NOTE — Telephone Encounter (Signed)
10:30a Return call made to patient's residence and a relative named [place] answered the phone. I explained that patient's wife Caryl Asp just called asking about dressing changes for patient's arm and was inquiring as to when someone would be out to dress it. She was not sure if she was contacting the correct agency. Explained that patient has [facility] for dressing changes and provided her with their information to contact them. Izora Gala advised that she will let Joy know.

## 2022-02-06 NOTE — H&P (Signed)
History and Physical    Patient: Dale [name]:[id number] DOB: [birth year] DOA: 02/06/2022 DOS: the patient was seen and examined on 02/07/2022 PCP: Redmond School, MD  Patient coming from: Home  Chief Complaint:  Chief Complaint  Patient presents with   Chest Pain   HPI: Dale [name] is a 88 y.o. male with medical history significant of CAD s/p stent placement (01/2012) multiple myeloma, on chemotherapy Revlimid and Velcade, most recent 9/28, chronic anemia, microcytic anemia, ITP, hypertension, BPH, hyperlipidemia who presents to the emergency department due to chest pain which started around 2 PM this afternoon while at rest.  He had left-sided chest port placed about 3 weeks ago for treatment of multiple myeloma.  Chest pain was nonreproducible and was nonradiating, it was of moderate intensity with no known aggravating/alleviating factors.  He has not been compliant with Lasix, though he was compliant with oral prescribed medications.  He denies fever, chills, headache, shortness of breath, nausea, vomiting. Patient was recently admitted from 9/13 to 9/14 due to syncope in the setting of symptomatic anemia during which patient received blood transfusion and platelet transfusion.  ED Course:  In the emergency department, BP was 167/76 on arrival to the ED, other vital signs were within normal range.  Work-up in the ED showed pancytopenia, BMP was normal except for sodium of 132 blood glucose of 106.  Albumin 2.6 D-dimer was 1.30, troponin x2 was flat at 3. CT angiography of the chest with contrast showed: 1. No evidence of pulmonary embolus. 2. Pulmonary scarring and calcified mediastinal lymph nodes consistent with known history of sarcoidosis. 3. Dilated main pulmonary artery consistent with pulmonary arterial hypertension. 4. Aortic Atherosclerosis. Coronary artery atherosclerosis.   Chest x-ray showed mild, chronic appearing increased lung markings with mild linear atelectasis  within the mid to upper right lung Patient was treated with aspirin 324 mg x 1, nitroglycerin patch was placed with improved chest pain.  Hospitalist was asked to admit patient for further evaluation and management.  Review of Systems: Review of systems as noted in the HPI. All other systems reviewed and are negative.   Past Medical History:  Diagnosis Date   Allergic rhinitis    Anal fissure    Anginal pain (HCC)    Asthma    Cancer (HCC)    skin   Chest pain    CHF (congestive heart failure) ([place])    Coronary heart disease    s/p stenting. cath in 01/2012 noncritical occlusion   Dyspnea    Dysrhythmia    1st degree heart block   GERD (gastroesophageal reflux disease)    Glaucoma    Hiatal hernia    Hyperlipidemia    Hypertension    Hypothyroidism    Idiopathic thrombocytopenic purpura ([place]) 2002   Macular degeneration    Nephrolithiasis    PUD (peptic ulcer disease)    remote   Sarcoidosis    pulmonary   Schatzki's ring    Past Surgical History:  Procedure Laterality Date   cardiac stents     COLONOSCOPY  10/30/2006   Normal rectum, sigmoid diverticula.Remainder of colonic mucosa appeared normal.   CORONARY ANGIOPLASTY WITH STENT PLACEMENT     about 10 years ago per pt (around 2007)   [place], URETEROSCOPY AND STENT PLACEMENT Left 06/16/2017   Procedure: CYSTOSCOPY WITH RETROGRADE PYELOGRAM, URETEROSCOPY,STONE EXTRACTION  AND STENT PLACEMENT;  Surgeon: Franchot Gallo, MD;  Location: [facility];  Service: Urology;  Laterality: Left;   ESOPHAGOGASTRODUODENOSCOPY  06/19/2004   Two esophageal rings and esophageal web as described above.  All of these were disrupted by passing 56-French Venia Minks dilator/ Candida esophagitis,which appears to be incidental given history of   antibiotic use, but nevertheless will be treated.   ESOPHAGOGASTRODUODENOSCOPY  10/30/2006   Distal tandem esophageal ring status post dilation disruption as  described above.   Otherwise normal esophagus/  Small hiatal hernia otherwise normal stomach, D1 and D2   ESOPHAGOGASTRODUODENOSCOPY N/A 03/22/2015   Dr.Rourk- noncritical schatzki's ring and hiatal hernia-o/w normal EGD.    ESOPHAGOGASTRODUODENOSCOPY (EGD) WITH ESOPHAGEAL DILATION  03/04/2012   RMR- schatzki's ring, hiatal hernia, polypoid gastric mucosa, bx= minimally active gastritis.   HOLMIUM LASER APPLICATION Left 10/25/2977   Procedure: HOLMIUM LASER APPLICATION;  Surgeon: Franchot Gallo, MD;  Location: [facility];  Service: Urology;  Laterality: Left;   IR CV LINE INJECTION  12/27/2021   IR GENERIC HISTORICAL  03/06/2016   IR RADIOLOGIST EVAL & MGMT 03/06/2016 Aletta Edouard, MD [facility] RAD   IR GENERIC HISTORICAL  06/18/2016   IR RADIOLOGIST EVAL & MGMT 06/18/2016 Aletta Edouard, MD [facility]   IR IMAGING GUIDED PORT INSERTION  12/18/2021   IR IMAGING GUIDED PORT INSERTION  01/15/2022   IR RADIOLOGIST EVAL & MGMT  10/01/2016   IR RADIOLOGIST EVAL & MGMT  10/15/2017   IR RADIOLOGIST EVAL & MGMT  12/24/2018   IR RADIOLOGIST EVAL & MGMT  01/04/2020   IR RADIOLOGIST EVAL & MGMT  06/20/2021   IR REMOVAL TUN ACCESS W/ PORT W/O FL MOD SED  01/15/2022   LEFT HEART CATH N/A 02/02/2012   Procedure: LEFT HEART CATH;  Surgeon: Lorretta Harp, MD;  Location[facility] C[facility];  Service: Cardiovascular;  Laterality: N/A;   MEDIASTINOSCOPY     for dx sarcoid   RADIOLOGY WITH ANESTHESIA Left 05/17/2016   Procedure: left renal ablation;  Surgeon: Aletta Edouard, MD;  Location: [facility];  Service: Radiology;  Laterality: Left;    Social History:  reports that he quit smoking about 51 years ago. His smoking use included cigarettes and cigars. He has a 0.20 pack-year smoking history. He has never used smokeless tobacco. He reports that he does not drink alcohol and does not use drugs.   Allergies  Allergen Reactions   Azithromycin Other (See Comments)    Sore mouth and fever blisters around mouth, sores in nose area as  well   Doxazosin Shortness Of Breath   Atenolol Other (See Comments)    UNKNOWN REACTION   Hydrocodone Nausea And Vomiting   Levofloxacin Other (See Comments)    Caused stomach problems.   Morphine Other (See Comments)    "made me crazy"   Penicillins Nausea And Vomiting and Other (See Comments)    Has patient had a PCN reaction causing immediate rash, facial/tongue/throat swelling, SOB or lightheadedness with hypotension: No Has patient had a PCN reaction causing severe rash involving mucus membranes or skin necrosis: No Has patient had a PCN reaction that required hospitalization No Has patient had a PCN reaction occurring within the last 10 years: No If all of the above answers are "NO", then may proceed with Cephalosporin use.    Sulfonamide Derivatives Nausea And Vomiting    Family History  Problem Relation Age of Onset   Heart disease Father        deceased age 66   Stroke Mother    Alzheimer's disease Mother    Heart attack Brother  deceased age 57   Cancer Other        niece   Colon cancer Neg Hx       Prior to Admission medications   Medication Sig Start Date End Date Taking? Authorizing Provider  acetaminophen (TYLENOL) 325 MG tablet Take 2 tablets (650 mg total) by mouth every 6 (six) hours as needed for mild pain (or Fever >/= 101). 01/17/22  Yes Barton Dubois, MD  acyclovir (ZOVIRAX) 400 MG tablet Take 1 tablet (400 mg total) by mouth 2 (two) times daily. 11/08/21  Yes Derek Jack, MD  Ascorbic Acid (VITAMIN C PO) Take 500 mg by mouth every evening.   Yes [provider]  atorvastatin (LIPITOR) 40 MG tablet TAKE 1 TABLET BY MOUTH IN  THE EVENING Patient taking differently: Take 40 mg by mouth daily. 09/10/21  Yes Troy Sine, MD  Bortezomib (VELCADE IJ) Inject as directed once a week.   Yes [provider]  brimonidine (ALPHAGAN) 0.2 % ophthalmic solution Place 1 drop into the left eye 2 (two) times daily.   Yes [provider]  cyanocobalamin 1000 MCG tablet Take 1 tablet (1,000 mcg total) by mouth daily. 01/18/22  Yes Barton Dubois, MD  dexamethasone (DECADRON) 2 MG tablet TAKE 5 TABLETS BY MOUTH ONCE  WEEKLY 01/24/22  Yes Derek Jack, MD  dorzolamide-timolol (COSOPT) 22.3-6.8 MG/ML ophthalmic solution Place 1 drop into the left eye 2 (two) times daily.   Yes [provider]  furosemide (LASIX) 20 MG tablet If you notice any swelling, you may take 40 mg (two tablets) for the day. Patient taking differently: Take 20 mg by mouth 2 (two) times daily. 03/28/21  Yes Troy Sine, MD  isosorbide mononitrate (IMDUR) 60 MG 24 hr tablet Take 1 tablet (60 mg total) by mouth daily. TAKE 1 AND 1/2 TABLETS BY  MOUTH IN THE MORNING AND  1/2 TABLET AT NIGHT 01/17/22  Yes Barton Dubois, MD  levothyroxine (SYNTHROID) 75 MCG tablet Take 75 mcg by mouth daily before breakfast. 07/16/21  Yes [provider]  lidocaine-prilocaine (EMLA) cream Apply 1 Application topically as needed. 12/20/21  Yes Derek Jack, MD  losartan (COZAAR) 100 MG tablet TAKE 1 TABLET BY MOUTH  DAILY Patient taking differently: Take 100 mg by mouth daily. 09/21/21  Yes Troy Sine, MD  metoprolol tartrate (LOPRESSOR) 25 MG tablet TAKE 1 TABLET BY MOUTH IN  THE MORNING AND ONE-HALF  TABLET BY MOUTH IN THE  EVENING Patient taking differently: Take 12.5-25 mg by mouth in the morning and at bedtime. TAKE 1 TABLET BY MOUTH IN  THE MORNING AND ONE-HALF  TABLET BY MOUTH IN THE  EVENING 07/13/21  Yes Troy Sine, MD  Multiple Vitamins-Minerals (PRESERVISION/LUTEIN) CAPS Take 1 capsule by mouth 2 (two) times daily.   Yes [provider]  nitroGLYCERIN (NITROSTAT) 0.4 MG SL tablet Place 1 tablet (0.4 mg total) under the tongue every 5 (five) minutes as needed. For chest pain 09/27/20  Yes Troy Sine, MD  pantoprazole (PROTONIX) 40 MG tablet TAKE 1 TABLET BY MOUTH DAILY  BEFORE BREAKFAST 01/28/22  Yes Mahon,  Courtney L, NP  potassium chloride SA (KLOR-CON M) 20 MEQ tablet TAKE 1 TABLET BY MOUTH  DAILY Patient taking differently: Take 20 mEq by mouth daily. 05/28/21  Yes Troy Sine, MD  prochlorperazine (COMPAZINE) 10 MG tablet Take 1 tablet (10 mg total) by mouth every 6 (six) hours as needed for nausea or vomiting. 12/20/21  Yes  Derek Jack, MD  ROCKLATAN 0.02-0.005 % SOLN Place 1 drop into the left eye at bedtime. 12/21/19  Yes [provider]  triamcinolone (KENALOG) 0.1 % cream Apply 1 application. topically 2 (two) times daily as needed (for irritation).   Yes [provider]  vitamin E 200 UNIT capsule Take 200 Units by mouth every evening.   Yes [provider]  feeding supplement (ENSURE ENLIVE / ENSURE PLUS) LIQD Take 237 mLs by mouth 3 (three) times daily between meals. 01/17/22   Barton Dubois, MD    Physical Exam: BP (!) 183/73 (BP Location: Left Arm)   Pulse 84   Temp 98.4 F (36.9 C)   Resp 16   Ht _0  (1.727 m)   Wt 54.7 kg   SpO2 100%   BMI 18.34 kg/m   General: 88 y.o. year-old male well developed well nourished in no acute distress.  Alert and oriented x3. HEENT: NCAT, EOMI Neck: Supple, trachea medial Cardiovascular: Regular rate and rhythm with no rubs or gallops.  No thyromegaly or JVD noted.  B/L lower extremity edema. 2/4 pulses in all 4 extremities. Respiratory: Clear to auscultation with no wheezes or rales. Good inspiratory effort. Abdomen: Soft, nontender nondistended with normal bowel sounds x4 quadrants. Muskuloskeletal: No cyanosis, clubbing or edema noted bilaterally Neuro: CN II-XII intact, sensation, reflexes intact Skin: No ulcerative lesions noted or rashes Psychiatry: Judgement and insight appear normal. Mood is appropriate for condition and setting          Labs on Admission:  Basic Metabolic Panel: Recent Labs  Lab 01/31/22 0813 02/06/22 1656  NA 131* 132*  K 3.8 4.2  CL 105 104  CO2 24 24  GLUCOSE  112* 106*  BUN 21 19  CREATININE 0.83 0.85  CALCIUM 8.5* 8.4*  MG 2.1  --    Liver Function Tests: Recent Labs  Lab 01/31/22 0813 02/06/22 1656  AST 22 20  ALT 22 24  ALKPHOS 61 60  BILITOT 0.7 0.4  PROT 8.6* 8.6*  ALBUMIN 2.5* 2.6*   No results for input(s): "LIPASE", "AMYLASE" in the last 168 hours. No results for input(s): "AMMONIA" in the last 168 hours. CBC: Recent Labs  Lab 01/31/22 0749 02/06/22 1656  WBC 2.9* 2.0*  NEUTROABS 1.4*  --   HGB 7.5* 7.1*  HCT 22.9* 22.0*  MCV 102.2* 105.3*  PLT 98* 62*   Cardiac Enzymes: No results for input(s): "CKTOTAL", "CKMB", "CKMBINDEX", "TROPONINI" in the last 168 hours.  BNP (last 3 results) Recent Labs    07/27/21 1604 07/29/21 0826  BNP 642.0* 252.0*    ProBNP (last 3 results) No results for input(s): "PROBNP" in the last 8760 hours.  CBG: No results for input(s): "GLUCAP" in the last 168 hours.  Radiological Exams on Admission: CT Angio Chest PE W and/or Wo Contrast  Result Date: 02/06/2022 CLINICAL DATA:  Left-sided chest pain since 2 p.m., history of coronary artery disease, positive D dimer EXAM: CT ANGIOGRAPHY CHEST WITH CONTRAST TECHNIQUE: Multidetector CT imaging of the chest was performed using the standard protocol during bolus administration of intravenous contrast. Multiplanar CT image reconstructions and MIPs were obtained to evaluate the vascular anatomy. RADIATION DOSE REDUCTION: This exam was performed according to the departmental dose-optimization program which includes automated exposure control, adjustment of the mA and/or kV according to patient size and/or use of iterative reconstruction technique. CONTRAST:  73m OMNIPAQUE IOHEXOL 350 MG/ML SOLN COMPARISON:  02/06/2022, 01/16/2022 FINDINGS: Cardiovascular: This is a technically adequate evaluation of  the pulmonary vasculature. No filling defects or pulmonary emboli. Dilated main pulmonary artery measuring 3.6 cm consistent with pulmonary arterial  hypertension. The heart is unremarkable without pericardial effusion. Diffuse atherosclerosis throughout the coronary vasculature. No evidence of thoracic aortic aneurysm or dissection. Stable aortic atherosclerosis. Left chest wall port via internal jugular approach tip in the superior vena cava. Mediastinum/Nodes: Stable calcified mediastinal lymph nodes consistent with known history of sarcoidosis. Thyroid, trachea, and esophagus are stable. Lungs/Pleura: Chronic areas of bilateral parenchymal scarring, most pronounced within the superior segment of the right lower lobe. No acute airspace disease, effusion, or pneumothorax. The central airways are patent. Upper Abdomen: No acute abnormality. Musculoskeletal: No acute or destructive bony lesions. Reconstructed images demonstrate no additional findings. Review of the MIP images confirms the above findings. IMPRESSION: 1. No evidence of pulmonary embolus. 2. Pulmonary scarring and calcified mediastinal lymph nodes consistent with known history of sarcoidosis. 3. Dilated main pulmonary artery consistent with pulmonary arterial hypertension. 4. Aortic Atherosclerosis (ICD10-I70.0). Coronary artery atherosclerosis. Electronically Signed   By: Randa Ngo M.D.   On: 02/06/2022 20:18   DG Chest Port 1 View  Result Date: 02/06/2022 CLINICAL DATA:  Left-sided chest pain. EXAM: PORTABLE CHEST 1 VIEW COMPARISON:  December 24, 2021 FINDINGS: The right-sided venous Port-A-Cath seen on the prior study has been removed and has been replaced with a left-sided venous Port-A-Cath. Its distal tip is seen at the junction of the superior vena cava and right atrium. The heart size and mediastinal contours are within normal limits. Mild, chronic appearing diffusely increased lung markings are seen. Mild linear atelectasis is noted within the mid to upper right lung. There is no evidence of a pleural effusion or pneumothorax. Multilevel degenerative changes are seen throughout the  thoracic spine. IMPRESSION: Mild, chronic appearing increased lung markings with mild linear atelectasis within the mid to upper right lung. Electronically Signed   By: Virgina Norfolk M.D.   On: 02/06/2022 16:55    EKG: I independently viewed the EKG done and my findings are as followed: Normal sinus rhythm at a rate of 88 bpm with prolonged PR interval  Assessment/Plan Present on Admission:  Chest pain  Pancytopenia ([place])  Anemia associated with chemotherapy  Essential hypertension  GERD  Hyponatremia  CAD, RCA stent, RCA new DES July 2009, Cath [place] 2011, 02/02/12  Principal Problem:   Chest pain Active Problems:   Acquired hypothyroidism   Essential hypertension   CAD, RCA stent, RCA new DES July 2009, Cath [place] 2011, 02/02/12   GERD   Mixed hyperlipidemia   Hyponatremia   Pancytopenia (HCC)   Anemia associated with chemotherapy   Elevated d-dimer  Chest pain rule out ACS Continue telemetry  Troponins x2 - 3, 3 EKG personally reviewed showed normal sinus rhythm at a rate of 88 bpm with prolonged PR interval  Cardiology will be consulted to help decide if Stress test is needed in am Versus other  diagnostic modalities.  Nitroglycerin patch given in the ED Continue aspirin  Pancytopenia Elevated MCV (105.3) Neutropenia, anemia and thrombocytopenia This is possibly secondary to chemotherapy effect (patient has an underlying history of multiple myeloma) Continue vitamin B12 Patient may require blood transfusion and platelet transfusion prior to discharge Oncology will be consulted and we shall await further recommendations  Elevated D-dimer D-dimer 1.3 CT angiography of chest done ruled out pulmonary embolism  Hyponatremia Na  132,  possibly due to diuretic effect  Hypoalbuminemia possibly secondary to moderate protein calorie malnutrition Albumin 2.6, protein supplement to be provided  Multiple myeloma Patient follows with Dr. Delton Coombes Last chemotherapy infusion was 9/28  Hypothyroidism Continue Synthroid  GERD Continue Protonix  Essential hypertension Losartan, Imdur  Mixed hyperlipidemia Lipitor  CAD s/p stent placement Aspirin, Lipitor, nitroglycerin as needed, Imdur   DVT prophylaxis: SCDs  Code Status: Full code  Consults: Cardiology, oncology  Family Communication: None at bedside  Severity of Illness: The appropriate patient status for this patient is OBSERVATION. Observation status is judged to be reasonable and necessary in order to provide the required intensity of service to ensure the patient's safety. The patient's presenting symptoms, physical exam findings, and initial radiographic and laboratory data in the context of their medical condition is felt to place them at decreased risk for further clinical deterioration. Furthermore, it is anticipated that the patient will be medically stable for discharge from the hospital within 2 midnights of admission.   Author: Bernadette Hoit, DO 02/07/2022 3:44 AM  For on call review [email or web address].

## 2022-02-06 NOTE — ED Provider Notes (Signed)
[facility] EMERGENCY DEPARTMENT Provider Note   CSN: [id number] Arrival date & time: 02/06/22  1554     History  Chief Complaint  Patient presents with   Chest Pain    Dale [name] is a 88 y.o. male with history of CAD, CHF, cardiac stent placement in 2011, and multiple myeloma currently undergoing chemotherapy infusions for treatment, presents to the ED today with concerns of left-sided chest pain that began around 2 PM this afternoon.  Patient states he was sitting when the symptoms began and describes as a "squeezing feeling around his heart".  Over the weekend, patient had been working outside in the yard.  Patient had left-sided chest port placed approximately 3 weeks ago for infusion treatment of multiple myeloma.  Denies redness, swelling, pain at port site.  Last infusion was 6 days ago, patient denies any side effects after treatment.  Denies fevers, chills, shortness of breath, new or worsening lower extremity edema, syncope, cough. Patient is noncompliant with Lasix, but takes all other medications as prescribed.       Home Medications Prior to Admission medications   Medication Sig Start Date End Date Taking? Authorizing Provider  acetaminophen (TYLENOL) 325 MG tablet Take 2 tablets (650 mg total) by mouth every 6 (six) hours as needed for mild pain (or Fever >/= 101). 01/17/22   Barton Dubois, MD  acyclovir (ZOVIRAX) 400 MG tablet Take 1 tablet (400 mg total) by mouth 2 (two) times daily. 11/08/21   Derek Jack, MD  Ascorbic Acid (VITAMIN C PO) Take 500 mg by mouth every evening.    [provider]  atorvastatin (LIPITOR) 40 MG tablet TAKE 1 TABLET BY MOUTH IN  THE EVENING Patient taking differently: Take 40 mg by mouth daily. 09/10/21   Troy Sine, MD  Bortezomib (VELCADE IJ) Inject as directed once a week.    [provider]  brimonidine (ALPHAGAN) 0.2 % ophthalmic solution Place 1 drop into the left eye 2 (two) times daily.    [provider]  cyanocobalamin 1000 MCG tablet Take 1 tablet (1,000 mcg total) by mouth daily. 01/18/22   Barton Dubois, MD  dexamethasone (DECADRON) 2 MG tablet TAKE 5 TABLETS BY MOUTH ONCE  WEEKLY 01/24/22   Derek Jack, MD  dorzolamide-timolol (COSOPT) 22.3-6.8 MG/ML ophthalmic solution Place 1 drop into the left eye 2 (two) times daily.    [provider]  feeding supplement (ENSURE ENLIVE / ENSURE PLUS) LIQD Take 237 mLs by mouth 3 (three) times daily between meals. 01/17/22   Barton Dubois, MD  furosemide (LASIX) 20 MG tablet If you notice any swelling, you may take 40 mg (two tablets) for the day. Patient taking differently: Take 20 mg by mouth 2 (two) times daily. 03/28/21   Troy Sine, MD  isosorbide mononitrate (IMDUR) 60 MG 24 hr tablet Take 1 tablet (60 mg total) by mouth daily. TAKE 1 AND 1/2 TABLETS BY  MOUTH IN THE MORNING AND  1/2 TABLET AT NIGHT 01/17/22   Barton Dubois, MD  levothyroxine (SYNTHROID) 75 MCG tablet Take 75 mcg by mouth daily before breakfast. 07/16/21   [provider]  lidocaine-prilocaine (EMLA) cream Apply 1 Application topically as needed. Patient not taking: Reported on 01/31/2022 12/20/21   Derek Jack, MD  losartan (COZAAR) 100 MG tablet TAKE 1 TABLET BY MOUTH  DAILY Patient taking differently: Take 100 mg by mouth daily. 09/21/21   Troy Sine, MD  metoprolol tartrate (LOPRESSOR) 25 MG tablet TAKE 1  TABLET BY MOUTH IN  THE MORNING AND ONE-HALF  TABLET BY MOUTH IN THE  EVENING Patient taking differently: Take 12.5-25 mg by mouth in the morning and at bedtime. TAKE 1 TABLET BY MOUTH IN  THE MORNING AND ONE-HALF  TABLET BY MOUTH IN THE  EVENING 07/13/21   Troy Sine, MD  Multiple Vitamins-Minerals (PRESERVISION/LUTEIN) CAPS Take 1 capsule by mouth 2 (two) times daily.    [provider]  nitroGLYCERIN (NITROSTAT) 0.4 MG SL tablet Place 1 tablet (0.4 mg total) under the tongue every 5 (five) minutes as needed.  For chest pain Patient not taking: Reported on 01/31/2022 09/27/20   Troy Sine, MD  pantoprazole (PROTONIX) 40 MG tablet TAKE 1 TABLET BY MOUTH DAILY  BEFORE BREAKFAST 01/28/22   Venetia Night L, NP  potassium chloride SA (KLOR-CON M) 20 MEQ tablet TAKE 1 TABLET BY MOUTH  DAILY Patient taking differently: Take 20 mEq by mouth daily. 05/28/21   Troy Sine, MD  prochlorperazine (COMPAZINE) 10 MG tablet Take 1 tablet (10 mg total) by mouth every 6 (six) hours as needed for nausea or vomiting. Patient not taking: Reported on 01/31/2022 12/20/21   Derek Jack, MD  ROCKLATAN 0.02-0.005 % SOLN Place 1 drop into the left eye at bedtime. 12/21/19   [provider]  triamcinolone (KENALOG) 0.1 % cream Apply 1 application. topically 2 (two) times daily as needed (for irritation).    [provider]  vitamin E 200 UNIT capsule Take 200 Units by mouth every evening.    [provider]      Allergies    Azithromycin, Doxazosin, Atenolol, Hydrocodone, Levofloxacin, Morphine, Penicillins, and Sulfonamide derivatives    Review of Systems   Review of Systems  Constitutional:  Negative for diaphoresis.  Respiratory:  Negative for chest tightness and shortness of breath.   Cardiovascular:  Positive for chest pain. Negative for palpitations.    Physical Exam Updated Vital Signs BP (!) 149/65   Pulse 76   Temp 98.6 F (37 C) (Oral)   Resp 12   Ht _0  (1.727 m)   Wt 54.4 kg   SpO2 96%   BMI 18.25 kg/m  Physical Exam Vitals and nursing note reviewed.  Constitutional:      General: He is not in acute distress.    Appearance: He is underweight. He is not ill-appearing or diaphoretic.  Cardiovascular:     Rate and Rhythm: Normal rate and regular rhythm.     Pulses: Normal pulses.          Radial pulses are 2+ on the right side and 2+ on the left side.       Dorsalis pedis pulses are 2+ on the right side and 2+ on the left side.     Heart sounds: Normal  heart sounds.  Pulmonary:     Effort: Pulmonary effort is normal. No respiratory distress.     Breath sounds: Normal breath sounds.  Chest:     Chest wall: No tenderness.  Abdominal:     Palpations: Abdomen is soft.     Tenderness: There is no abdominal tenderness.  Musculoskeletal:     Right lower leg: Edema present.     Left lower leg: Edema present.  Skin:    General: Skin is warm and dry.     Capillary Refill: Capillary refill takes less than 2 seconds.     Findings: Ecchymosis present.  Neurological:     Mental Status: He is alert.  Psychiatric:        Mood and Affect: Mood normal.        Behavior: Behavior normal. Behavior is cooperative.     ED Results / Procedures / Treatments   Labs (all labs ordered are listed, but only abnormal results are displayed) Labs Reviewed  CBC  COMPREHENSIVE METABOLIC PANEL  TROPONIN I (HIGH SENSITIVITY)    EKG EKG Interpretation  Date/Time:  Wednesday February 06 2022 16:11:39 EDT Ventricular Rate:  88 PR Interval:  249 QRS Duration: 89 QT Interval:  345 QTC Calculation: 418 R Axis:   49 Text Interpretation: Sinus rhythm Prolonged PR interval ECG OTHERWISE WITHIN NORMAL LIMITS Confirmed by Noemi Chapel [id number]) on 02/06/2022 4:29:42 PM  Radiology DG Chest Port 1 View  Result Date: 02/06/2022 CLINICAL DATA:  Left-sided chest pain. EXAM: PORTABLE CHEST 1 VIEW COMPARISON:  December 24, 2021 FINDINGS: The right-sided venous Port-A-Cath seen on the prior study has been removed and has been replaced with a left-sided venous Port-A-Cath. Its distal tip is seen at the junction of the superior vena cava and right atrium. The heart size and mediastinal contours are within normal limits. Mild, chronic appearing diffusely increased lung markings are seen. Mild linear atelectasis is noted within the mid to upper right lung. There is no evidence of a pleural effusion or pneumothorax. Multilevel degenerative changes are seen throughout the thoracic  spine. IMPRESSION: Mild, chronic appearing increased lung markings with mild linear atelectasis within the mid to upper right lung. Electronically Signed   By: Virgina Norfolk M.D.   On: 02/06/2022 16:55    Procedures Procedures    Medications Ordered in ED Medications  aspirin 81 MG chewable tablet (has no administration in time range)  aspirin chewable tablet 324 mg (324 mg Oral Given 02/06/22 1649)  nitroGLYCERIN (NITROGLYN) 2 % ointment 1 inch (1 inch Topical Given 02/06/22 1700)    ED Course/ Medical Decision Making/ A&P Clinical Course as of 02/06/22 1855  Wed Feb 06, 2022  1853 Troponin I (High Sensitivity) [MC]    Clinical Course User Index [MC] Pat Kocher, PA                           Medical Decision Making Amount and/or Complexity of Data Reviewed Labs: ordered. Decision-making details documented in ED Course. Radiology: ordered.  Risk OTC drugs. Prescription drug management. Decision regarding hospitalization.   This patient presents to the ED for concern of left-sided chest pain with previous stent placement, this involves an extensive number of treatment options, and is a complaint that carries with it a high risk of complications and morbidity.  The differential diagnosis includes MI, myocardial ischemia, CAD, pulmonary embolism.   Co morbidities that complicate the patient evaluation  CAD with previous stent placement, multiple myeloma currently undergoing chemotherapy   Additional history obtained:  Additional history obtained from patient's wife. External records from outside source obtained and reviewed including oncology notes, lab results from 01/2022.   Lab Tests:  I Ordered, and personally interpreted labs.  The pertinent results include:  Troponin neg, WBC 2.0, RBC 2.09, Hgb 7.1, Plt 62   Imaging Studies ordered:  I ordered imaging studies including x-ray I independently visualized and interpreted imaging which showed left-sided  Port-A-Cath placement, mild chronic lung changes, no evidence of pneumothorax pleural effusion. I agree with the radiologist interpretation   Cardiac Monitoring: / EKG:  The patient was maintained on a cardiac monitor.  I personally viewed  and interpreted the cardiac monitored which showed an underlying rhythm of: normal sinus   Consultations Obtained:  I requested consultation with the hospitalist team,  and discussed lab and imaging findings as well as pertinent plan - they recommend: admit patient   Problem List / ED Course / Critical interventions / Medication management  Chest pain I ordered medication including aspirin, nitroglycerin ointment for possible myocardial ischemia Reevaluation of the patient after these medicines showed that the patient improved, however, pain changed. Patient now stating pain sometimes occurs during deep inspiration/breathing.   Social Determinants of Health:  Elderly patient   Test / Admission - Considered:  Patient admission considered and consulted due to patient's cardiac history, comorbidities, age, and recent diagnosis of multiple myeloma currently undergoing chemotherapy treatment.   Discussed assessment and plan with Dr. Sabra Heck who agreed. CTA and troponin were both negative. Abnormal blood counts consistent with diagnosis of multiple myeloma. At re-evaluation of patient's chest pain, he reports it has improved with treatment, but is still present during deep inspiration at times. Patient's cardiovascular history, age, and recent cancer diagnosis place him at moderate-high risk of a major cardiac event. Consulted with hospitalist team who recommended admitting patient for further workup of chest pain and observation/management of condition.Discussed admission with patient and his wife who agreed with plan.         Final Clinical Impression(s) / ED Diagnoses Final diagnoses:  None    Rx / DC Orders ED Discharge Orders     None          Pat Kocher, [place] 02/08/22 1253    Noemi Chapel, MD 02/08/22 1255

## 2022-02-06 NOTE — ED Triage Notes (Signed)
Pt to ER with c/o aching left sided chest pain that started around 2pm.  Pt has history of stent placement.

## 2022-02-07 ENCOUNTER — Observation Stay (HOSPITAL_BASED_OUTPATIENT_CLINIC_OR_DEPARTMENT_OTHER): Payer: Medicare Other

## 2022-02-07 ENCOUNTER — Inpatient Hospital Stay: Payer: Medicare Other

## 2022-02-07 ENCOUNTER — Other Ambulatory Visit (HOSPITAL_COMMUNITY): Payer: Self-pay | Admitting: Hematology

## 2022-02-07 ENCOUNTER — Inpatient Hospital Stay: Payer: Medicare Other | Admitting: Hematology

## 2022-02-07 ENCOUNTER — Other Ambulatory Visit: Payer: Medicare Other

## 2022-02-07 ENCOUNTER — Inpatient Hospital Stay: Payer: Medicare Other | Admitting: Dietician

## 2022-02-07 ENCOUNTER — Ambulatory Visit: Payer: Medicare Other | Admitting: Hematology

## 2022-02-07 ENCOUNTER — Ambulatory Visit: Payer: Medicare Other

## 2022-02-07 DIAGNOSIS — R7989 Other specified abnormal findings of blood chemistry: Secondary | ICD-10-CM | POA: Diagnosis not present

## 2022-02-07 DIAGNOSIS — I2510 Atherosclerotic heart disease of native coronary artery without angina pectoris: Secondary | ICD-10-CM | POA: Diagnosis not present

## 2022-02-07 DIAGNOSIS — R079 Chest pain, unspecified: Secondary | ICD-10-CM

## 2022-02-07 DIAGNOSIS — C9000 Multiple myeloma not having achieved remission: Secondary | ICD-10-CM | POA: Diagnosis not present

## 2022-02-07 DIAGNOSIS — R0789 Other chest pain: Secondary | ICD-10-CM | POA: Diagnosis not present

## 2022-02-07 DIAGNOSIS — E871 Hypo-osmolality and hyponatremia: Secondary | ICD-10-CM | POA: Diagnosis not present

## 2022-02-07 DIAGNOSIS — T451X5A Adverse effect of antineoplastic and immunosuppressive drugs, initial encounter: Secondary | ICD-10-CM | POA: Diagnosis not present

## 2022-02-07 DIAGNOSIS — D6481 Anemia due to antineoplastic chemotherapy: Secondary | ICD-10-CM | POA: Diagnosis not present

## 2022-02-07 DIAGNOSIS — Z7982 Long term (current) use of aspirin: Secondary | ICD-10-CM | POA: Diagnosis not present

## 2022-02-07 DIAGNOSIS — I10 Essential (primary) hypertension: Secondary | ICD-10-CM | POA: Diagnosis not present

## 2022-02-07 LAB — COMPREHENSIVE METABOLIC PANEL
ALT: 21 U/L (ref 0–44)
AST: 18 U/L (ref 15–41)
Albumin: 2.2 g/dL — ABNORMAL LOW (ref 3.5–5.0)
Alkaline Phosphatase: 56 U/L (ref 38–126)
Anion gap: 6 (ref 5–15)
BUN: 17 mg/dL (ref 8–23)
CO2: 22 mmol/L (ref 22–32)
Calcium: 8.2 mg/dL — ABNORMAL LOW (ref 8.9–10.3)
Chloride: 104 mmol/L (ref 98–111)
Creatinine, Ser: 0.78 mg/dL (ref 0.61–1.24)
GFR, Estimated: >60 mL/min (ref >60)
Glucose, Bld: 90 mg/dL (ref 70–99)
Potassium: 3.8 mmol/L (ref 3.5–5.1)
Sodium: 132 mmol/L — ABNORMAL LOW (ref 135–145)
Total Bilirubin: 0.7 mg/dL (ref 0.3–1.2)
Total Protein: 7.6 g/dL (ref 6.5–8.1)

## 2022-02-07 LAB — CBC
HCT: 21.3 % — ABNORMAL LOW (ref 39.0–52.0)
Hemoglobin: 7 g/dL — ABNORMAL LOW (ref 13.0–17.0)
MCH: 34 pg (ref 26.0–34.0)
MCHC: 32.9 g/dL (ref 30.0–36.0)
MCV: 103.4 fL — ABNORMAL HIGH (ref 80.0–100.0)
Platelets: 59 10*3/uL — ABNORMAL LOW (ref 150–400)
RBC: 2.06 MIL/uL — ABNORMAL LOW (ref 4.22–5.81)
RDW: 26.4 % — ABNORMAL HIGH (ref 11.5–15.5)
WBC: 1.7 10*3/uL — ABNORMAL LOW (ref 4.0–10.5)
nRBC: 0 % (ref 0.0–0.2)

## 2022-02-07 LAB — ECHOCARDIOGRAM COMPLETE
Area-P 1/2: 4.04 cm2
Height: 68 in
S' Lateral: 2.9 cm
Weight: 1929.47 oz

## 2022-02-07 LAB — PHOSPHORUS: Phosphorus: 2.4 mg/dL — ABNORMAL LOW (ref 2.5–4.6)

## 2022-02-07 LAB — PREPARE RBC (CROSSMATCH)

## 2022-02-07 LAB — MAGNESIUM: Magnesium: 2 mg/dL (ref 1.7–2.4)

## 2022-02-07 MED ORDER — ACETAMINOPHEN 325 MG PO TABS: 650.0000 mg | ORAL_TABLET | Freq: Four times a day (QID) | ORAL | Status: DC | PRN

## 2022-02-07 MED ORDER — ENSURE ENLIVE PO LIQD
237.0000 mL | Freq: Two times a day (BID) | ORAL | Status: DC
  Administered 2022-02-07 – 2022-02-08 (×3): 237 mL via ORAL

## 2022-02-07 MED ORDER — ASPIRIN 81 MG PO TBEC: 81.0000 mg | DELAYED_RELEASE_TABLET | Freq: Every day | ORAL | 12 refills | Status: DC

## 2022-02-07 MED ORDER — ISOSORBIDE MONONITRATE ER 60 MG PO TB24
90.0000 mg | ORAL_TABLET | Freq: Every day | ORAL | Status: DC
  Administered 2022-02-07 – 2022-02-08 (×2): 90 mg via ORAL
  Filled 2022-02-07: qty 2

## 2022-02-07 MED ORDER — NITROGLYCERIN 0.4 MG SL SUBL: 0.4000 mg | SUBLINGUAL_TABLET | SUBLINGUAL | Status: DC | PRN

## 2022-02-07 MED ORDER — SODIUM CHLORIDE 0.9% IV SOLUTION: Freq: Once | INTRAVENOUS | Status: AC

## 2022-02-07 MED ORDER — ACYCLOVIR 800 MG PO TABS
400.0000 mg | ORAL_TABLET | Freq: Two times a day (BID) | ORAL | Status: DC
  Administered 2022-02-07 – 2022-02-08 (×3): 400 mg via ORAL
  Filled 2022-02-07 (×3): qty 1

## 2022-02-07 MED ORDER — ISOSORBIDE MONONITRATE ER 60 MG PO TB24
60.0000 mg | ORAL_TABLET | Freq: Every day | ORAL | Status: DC
  Filled 2022-02-07: qty 1

## 2022-02-07 MED ORDER — LEVOTHYROXINE SODIUM 75 MCG PO TABS
75.0000 ug | ORAL_TABLET | Freq: Every day | ORAL | Status: DC
  Administered 2022-02-07 – 2022-02-08 (×2): 75 ug via ORAL
  Filled 2022-02-07 (×2): qty 1

## 2022-02-07 MED ORDER — ASPIRIN 81 MG PO TBEC
81.0000 mg | DELAYED_RELEASE_TABLET | Freq: Every day | ORAL | Status: DC
  Administered 2022-02-07 – 2022-02-08 (×2): 81 mg via ORAL
  Filled 2022-02-07 (×2): qty 1

## 2022-02-07 MED ORDER — LOSARTAN POTASSIUM 50 MG PO TABS
100.0000 mg | ORAL_TABLET | Freq: Every day | ORAL | Status: DC
  Administered 2022-02-07 – 2022-02-08 (×2): 100 mg via ORAL
  Filled 2022-02-07 (×2): qty 2

## 2022-02-07 MED ORDER — ISOSORBIDE MONONITRATE ER 60 MG PO TB24
30.0000 mg | ORAL_TABLET | Freq: Every day | ORAL | Status: DC
  Administered 2022-02-07: 30 mg via ORAL
  Filled 2022-02-07: qty 1

## 2022-02-07 MED ORDER — ATORVASTATIN CALCIUM 40 MG PO TABS
40.0000 mg | ORAL_TABLET | Freq: Every evening | ORAL | Status: DC
  Administered 2022-02-07: 40 mg via ORAL
  Filled 2022-02-07: qty 1

## 2022-02-07 MED ORDER — ACETAMINOPHEN 650 MG RE SUPP: 650.0000 mg | Freq: Four times a day (QID) | RECTAL | Status: DC | PRN

## 2022-02-07 MED ORDER — PANTOPRAZOLE SODIUM 40 MG PO TBEC
40.0000 mg | DELAYED_RELEASE_TABLET | Freq: Every day | ORAL | Status: DC
  Administered 2022-02-07 – 2022-02-08 (×2): 40 mg via ORAL
  Filled 2022-02-07 (×2): qty 1

## 2022-02-07 NOTE — Assessment & Plan Note (Addendum)
Continue metoprolol, losartan and Imdur for BP goal <140/90

## 2022-02-07 NOTE — Progress Notes (Signed)
  Echocardiogram 2D Echocardiogram has been performed.  Darlina Sicilian M 02/07/2022, 2:34 PM

## 2022-02-07 NOTE — Assessment & Plan Note (Addendum)
Atypical by clinical history Troponin 3>> 3 Personally reviewed EKG--sinus rhythm, nonspecific T wave change Personally reviewed chest x-ray--negative consolidation.  Right lower lobe scarring 10/5/ 23 Echo EF 65-70%, no WMA, mild MR CTA chest negative for PE Cardiology consult appreciated>>no further inpatient work up

## 2022-02-07 NOTE — Consult Note (Signed)
Cardiology Consultation   Patient ID: Dale [name] MRN: [id number]; DOB: Oct 08, 1928  Admit date: 02/06/2022 Date of Consult: 02/07/2022  PCP:  [facility] [place] [facility] Cardiologist:  Shelva Majestic, MD        Patient Profile:   Dale [name] is a 88 y.o. male with a hx of CAD (s/p prior intervention to RCA in 2000 and DES to RCA in 2009, cath in 2013 showing patent stent with nonobstructive disease along LAD and LCx), HTN, HLD, Hypothyroidism and Multiple Myeloma who is being seen 02/07/2022 for the evaluation of chest pain at the request of Dr. Josephine Cables.  History of Present Illness:   [name] was last examined by [name], PA-C in 07/2021 and reported an episode of chest discomfort approximately 2 months prior to his last visit. He had been off Plavix intermittently due to rectal bleeding and given his worsening anemia and thrombocytopenia, it was recommended to discontinue Plavix altogether. He was continued on ASA 81 mg daily, Atorvastatin 40 mg daily, Lasix 40 mg as needed, Imdur 90 mg in AM/30 mg in PM, Losartan 100 mg daily and Lopressor 25 mg in AM/12.5 mg in PM.  He was admitted to [facility] earlier this month for evaluation of a syncopal episode which occurred after having been NPO for Port-A-Cath insertion and also in the setting of vomiting. The discharge summary says medical therapy was adjusted but it appears he was discharged on his same cardiac medications.  He presented to [facility] ED on 02/06/2022 for evaluation of chest pain which started earlier that afternoon.  In talking with the patient today, he reports his chest discomfort started while sitting down. He describes this as a pressure along his left rectal region and the discomfort did not radiate into his neck or down his arm. Denies any associated nausea, vomiting or diaphoresis. He did try taking some nitroglycerin but says these had expired and he did not experience improvement in his  pain. His pain had resolved by the time he arrived to the ED. He reports still enjoying working in his yard and was able to do so this past weekend without any anginal symptoms. He denies any recent orthopnea, PND or pitting edema.  Initial labs show WBC 2.0, Hgb 7.1, platelets 62, Na+ 132, K+ 4.2 and creatinine 0.85. Albumin 2.6. AST 20 and ALT 24. Initial and repeat troponin values negative at 3. CXR shows mild, chronic appearing increased lung markings but no acute findings. CTA shows no evidence of a PE but was noted to have pulmonary scarring and calcified mediastinal lymph nodes consistent with sarcoidosis, dilated main pulmonary artery consistent with pulmonary hypertension and aortic atherosclerosis and coronary atherosclerosis. EKG shows NSR, HR 88 with first-degree AV block and no acute ST changes.   Past Medical History:  Diagnosis Date   Allergic rhinitis    Anal fissure    Anginal pain (HCC)    Asthma    Cancer (HCC)    skin   Chest pain    CHF (congestive heart failure) ([place])    Coronary heart disease    s/p stenting. cath in 01/2012 noncritical occlusion   Dyspnea    Dysrhythmia    1st degree heart block   GERD (gastroesophageal reflux disease)    Glaucoma    Hiatal hernia    Hyperlipidemia    Hypertension    Hypothyroidism    Idiopathic thrombocytopenic purpura ([place]) 2002   Macular degeneration  Nephrolithiasis    PUD (peptic ulcer disease)    remote   Sarcoidosis    pulmonary   Schatzki's ring     Past Surgical History:  Procedure Laterality Date   cardiac stents     COLONOSCOPY  10/30/2006   Normal rectum, sigmoid diverticula.Remainder of colonic mucosa appeared normal.   CORONARY ANGIOPLASTY WITH STENT PLACEMENT     about 10 years ago per pt (around 2007)   [place], URETEROSCOPY AND STENT PLACEMENT Left 06/16/2017   Procedure: CYSTOSCOPY WITH RETROGRADE PYELOGRAM, URETEROSCOPY,STONE EXTRACTION  AND STENT PLACEMENT;  Surgeon:  Franchot Gallo, MD;  Location: [facility];  Service: Urology;  Laterality: Left;   ESOPHAGOGASTRODUODENOSCOPY  06/19/2004   Two esophageal rings and esophageal web as described above.  All of these were disrupted by passing 56-French Venia Minks dilator/ Candida esophagitis,which appears to be incidental given history of   antibiotic use, but nevertheless will be treated.   ESOPHAGOGASTRODUODENOSCOPY  10/30/2006   Distal tandem esophageal ring status post dilation disruption as  described above.  Otherwise normal esophagus/  Small hiatal hernia otherwise normal stomach, D1 and D2   ESOPHAGOGASTRODUODENOSCOPY N/A 03/22/2015   Dr.Rourk- noncritical schatzki's ring and hiatal hernia-o/w normal EGD.    ESOPHAGOGASTRODUODENOSCOPY (EGD) WITH ESOPHAGEAL DILATION  03/04/2012   RMR- schatzki's ring, hiatal hernia, polypoid gastric mucosa, bx= minimally active gastritis.   HOLMIUM LASER APPLICATION Left 7/74/1287   Procedure: HOLMIUM LASER APPLICATION;  Surgeon: Franchot Gallo, MD;  Location: [facility];  Service: Urology;  Laterality: Left;   IR CV LINE INJECTION  12/27/2021   IR GENERIC HISTORICAL  03/06/2016   IR RADIOLOGIST EVAL & MGMT 03/06/2016 Aletta Edouard, MD [facility] INTERV RAD   IR GENERIC HISTORICAL  06/18/2016   IR RADIOLOGIST EVAL & MGMT 06/18/2016 Aletta Edouard, MD [facility] INTERV RAD   IR IMAGING GUIDED PORT INSERTION  12/18/2021   IR IMAGING GUIDED PORT INSERTION  01/15/2022   IR RADIOLOGIST EVAL & MGMT  10/01/2016   IR RADIOLOGIST EVAL & MGMT  10/15/2017   IR RADIOLOGIST EVAL & MGMT  12/24/2018   IR RADIOLOGIST EVAL & MGMT  01/04/2020   IR RADIOLOGIST EVAL & MGMT  06/20/2021   IR REMOVAL TUN ACCESS W/ PORT W/O FL MOD SED  01/15/2022   LEFT HEART CATH N/A 02/02/2012   Procedure: LEFT HEART CATH;  Surgeon: Lorretta Harp, MD;  Location: [facility] C[facility] LAB;  Service: Cardiovascular;  Laterality: N/A;   MEDIASTINOSCOPY     for dx sarcoid   RADIOLOGY WITH ANESTHESIA Left 05/17/2016   Procedure: left renal  ablation;  Surgeon: Aletta Edouard, MD;  Location: [facility];  Service: Radiology;  Laterality: Left;     Home Medications:  Prior to Admission medications   Medication Sig Start Date End Date Taking? Authorizing Provider  acetaminophen (TYLENOL) 325 MG tablet Take 2 tablets (650 mg total) by mouth every 6 (six) hours as needed for mild pain (or Fever >/= 101). 01/17/22  Yes Barton Dubois, MD  acyclovir (ZOVIRAX) 400 MG tablet Take 1 tablet (400 mg total) by mouth 2 (two) times daily. 11/08/21  Yes Derek Jack, MD  Ascorbic Acid (VITAMIN C PO) Take 500 mg by mouth every evening.   Yes [provider]  atorvastatin (LIPITOR) 40 MG tablet TAKE 1 TABLET BY MOUTH IN  THE EVENING Patient taking differently: Take 40 mg by mouth daily. 09/10/21  Yes Troy Sine, MD  Bortezomib (VELCADE IJ) Inject as directed once a week.   Yes  [provider]  brimonidine (ALPHAGAN) 0.2 % ophthalmic solution Place 1 drop into the left eye 2 (two) times daily.   Yes [provider]  cyanocobalamin 1000 MCG tablet Take 1 tablet (1,000 mcg total) by mouth daily. 01/18/22  Yes Barton Dubois, MD  dexamethasone (DECADRON) 2 MG tablet TAKE 5 TABLETS BY MOUTH ONCE  WEEKLY 01/24/22  Yes Derek Jack, MD  dorzolamide-timolol (COSOPT) 22.3-6.8 MG/ML ophthalmic solution Place 1 drop into the left eye 2 (two) times daily.   Yes [provider]  furosemide (LASIX) 20 MG tablet If you notice any swelling, you may take 40 mg (two tablets) for the day. Patient taking differently: Take 20 mg by mouth 2 (two) times daily. 03/28/21  Yes Troy Sine, MD  isosorbide mononitrate (IMDUR) 60 MG 24 hr tablet Take 1 tablet (60 mg total) by mouth daily. TAKE 1 AND 1/2 TABLETS BY  MOUTH IN THE MORNING AND  1/2 TABLET AT NIGHT 01/17/22  Yes Barton Dubois, MD  levothyroxine (SYNTHROID) 75 MCG tablet Take 75 mcg by mouth daily before breakfast. 07/16/21  Yes [provider]   lidocaine-prilocaine (EMLA) cream Apply 1 Application topically as needed. 12/20/21  Yes Derek Jack, MD  losartan (COZAAR) 100 MG tablet TAKE 1 TABLET BY MOUTH  DAILY Patient taking differently: Take 100 mg by mouth daily. 09/21/21  Yes Troy Sine, MD  metoprolol tartrate (LOPRESSOR) 25 MG tablet TAKE 1 TABLET BY MOUTH IN  THE MORNING AND ONE-HALF  TABLET BY MOUTH IN THE  EVENING Patient taking differently: Take 12.5-25 mg by mouth in the morning and at bedtime. TAKE 1 TABLET BY MOUTH IN  THE MORNING AND ONE-HALF  TABLET BY MOUTH IN THE  EVENING 07/13/21  Yes Troy Sine, MD  Multiple Vitamins-Minerals (PRESERVISION/LUTEIN) CAPS Take 1 capsule by mouth 2 (two) times daily.   Yes [provider]  nitroGLYCERIN (NITROSTAT) 0.4 MG SL tablet Place 1 tablet (0.4 mg total) under the tongue every 5 (five) minutes as needed. For chest pain 09/27/20  Yes Troy Sine, MD  pantoprazole (PROTONIX) 40 MG tablet TAKE 1 TABLET BY MOUTH DAILY  BEFORE BREAKFAST 01/28/22  Yes Mahon, Courtney L, NP  potassium chloride SA (KLOR-CON M) 20 MEQ tablet TAKE 1 TABLET BY MOUTH  DAILY Patient taking differently: Take 20 mEq by mouth daily. 05/28/21  Yes Troy Sine, MD  prochlorperazine (COMPAZINE) 10 MG tablet Take 1 tablet (10 mg total) by mouth every 6 (six) hours as needed for nausea or vomiting. 12/20/21  Yes Derek Jack, MD  ROCKLATAN 0.02-0.005 % SOLN Place 1 drop into the left eye at bedtime. 12/21/19  Yes [provider]  triamcinolone (KENALOG) 0.1 % cream Apply 1 application. topically 2 (two) times daily as needed (for irritation).   Yes [provider]  vitamin E 200 UNIT capsule Take 200 Units by mouth every evening.   Yes [provider]  feeding supplement (ENSURE ENLIVE / ENSURE PLUS) LIQD Take 237 mLs by mouth 3 (three) times daily between meals. 01/17/22   Barton Dubois, MD    Inpatient Medications: Scheduled Meds:  sodium chloride    Intravenous Once   acyclovir  400 mg Oral BID   aspirin EC  81 mg Oral Daily   atorvastatin  40 mg Oral QPM   feeding supplement  237 mL Oral BID BM   isosorbide mononitrate  60 mg Oral Daily   levothyroxine  75 mcg Oral Q0600   losartan  100 mg Oral Daily   pantoprazole  40 mg Oral QAC breakfast   Continuous Infusions:  PRN Meds: acetaminophen **OR** acetaminophen, nitroGLYCERIN  Allergies:    Allergies  Allergen Reactions   Azithromycin Other (See Comments)    Sore mouth and fever blisters around mouth, sores in nose area as well   Doxazosin Shortness Of Breath   Atenolol Other (See Comments)    UNKNOWN REACTION   Hydrocodone Nausea And Vomiting   Levofloxacin Other (See Comments)    Caused stomach problems.   Morphine Other (See Comments)    "made me crazy"   Penicillins Nausea And Vomiting and Other (See Comments)    Has patient had a PCN reaction causing immediate rash, facial/tongue/throat swelling, SOB or lightheadedness with hypotension: No Has patient had a PCN reaction causing severe rash involving mucus membranes or skin necrosis: No Has patient had a PCN reaction that required hospitalization No Has patient had a PCN reaction occurring within the last 10 years: No If all of the above answers are "NO", then may proceed with Cephalosporin use.    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    Employer: RETIRED  Tobacco Use   Smoking status: Former    Packs/day: 0.10    Years: 2.00    Total pack years: 0.20    Types: Cigarettes, Cigars    Quit date: 05/06/1970    Years since quitting: 51.7   Smokeless tobacco: Never  Vaping Use   Vaping Use: Never used   Substance and Sexual Activity   Alcohol use: No    Alcohol/week: 0.0 standard drinks of alcohol   Drug use: No   Sexual activity: Never  Other Topics Concern   Not on file  Social History Narrative   Not on file   Social Determinants of Health   Financial Resource Strain: Not on file  Food Insecurity: No Food Insecurity (02/06/2022)   Hunger Vital Sign    Worried About Running Out of Food in the Last Year: Never true    Ran Out of Food in the Last Year: Never true  Transportation Needs: No Transportation Needs (02/06/2022)   PRAPARE - Hydrologist (Medical): No    Lack of Transportation (Non-Medical): No  Physical Activity: Not on file  Stress: Not on file  Social Connections: Not on file  Intimate Partner Violence: Not At Risk (02/06/2022)   Humiliation, Afraid, Rape, and Kick questionnaire    Fear of Current or Ex-Partner: No    Emotionally Abused: No    Physically Abused: No    Sexually Abused: No    Family History:    Family History  Problem Relation Age of Onset   Heart disease Father        deceased age 82   Stroke Mother    Alzheimer's disease Mother    Heart attack Brother        deceased age 19   Cancer Other        niece   Colon cancer Neg Hx      ROS:  Please see the history of present illness.  All other ROS reviewed and negative.     Physical Exam/Data:   Vitals:   02/06/22 2000 02/06/22 2100 02/06/22 2233 02/07/22 0446  BP: (!) 155/77 (!) 147/71 (!) 183/73 (!) 124/55  Pulse: 89 81 84 72  Resp: (!) 38 '15 16 15  ' Temp:   98.4 F (36.9 C) 98.8 F (37.1 C)  TempSrc:      SpO2: 100% 98% 100% 100%  Weight:   54.7 kg   Height:   '5\' 8"'  (1.727 m)     Intake/Output Summary (Last 24 hours) at 02/07/2022 0817 Last data filed at 02/07/2022 0732 Gross per 24 hour  Intake --  Output 150 ml  Net -150 ml      02/06/2022   10:33 PM 02/06/2022    4:01 PM 01/31/2022    8:04 AM  Last 3 Weights  Weight (lbs) 120 lb 9.5 oz  120 lb 120 lb 11.2 oz  Weight (kg) 54.7 kg 54.432 kg 54.749 kg     Body mass index is 18.34 kg/m.  General: Pleasant, elderly male appearing in no acute distress HEENT: normal Neck: no JVD Vascular: No carotid bruits; Distal pulses 2+ bilaterally Cardiac:  normal S1, S2; RRR; no murmur. Port noted.  Lungs:  clear to auscultation bilaterally, no wheezing, rhonchi or rales  Abd: soft, nontender, no hepatomegaly  Ext: no pitting edema Musculoskeletal:  No deformities, BUE and BLE strength normal and equal Skin: warm and dry  Neuro:  CNs 2-12 intact, no focal abnormalities noted Psych:  Normal affect   EKG:  The EKG was personally reviewed and demonstrates: NSR, HR 88 with first-degree AV block and no acute ST changes.  Telemetry:  Telemetry was personally reviewed and demonstrates: NSR, HR in 60's to 70's.   Relevant CV Studies:  NST: 12/2016 IMPRESSION: 1. No reversible ischemia or infarction.   2. Normal left ventricular wall motion.   3. Left ventricular ejection fraction 82%   4. Non invasive risk stratification*: Low  Echocardiogram: 05/2021 IMPRESSIONS     1. Left ventricular ejection fraction, by estimation, is 60 to 65%. The  left ventricle has normal function. The left ventricle has no regional  wall motion abnormalities. Left ventricular diastolic parameters were  normal.   2. Right ventricular systolic function is normal. The right ventricular  size is normal. There is mildly elevated pulmonary artery systolic  pressure.   3. The mitral valve is normal in structure. Mild mitral valve  regurgitation. No evidence of mitral stenosis.   4. The aortic valve is tricuspid. Aortic valve regurgitation is mild. No  aortic stenosis is present.   5. The inferior vena cava is normal in size with greater than 50%  respiratory variability, suggesting right atrial pressure of 3 mmHg.   Laboratory Data:  High Sensitivity Troponin:   Recent Labs  Lab 02/06/22 1656  02/06/22 1848  TROPONINIHS 3 3     Chemistry Recent Labs  Lab 02/06/22 1656 02/07/22 0420  NA 132* 132*  K 4.2 3.8  CL 104 104  CO2 24 22  GLUCOSE 106* 90  BUN 19 17  CREATININE 0.85 0.78  CALCIUM 8.4* 8.2*  MG  --  2.0  GFRNONAA >60 >60  ANIONGAP 4* 6    Recent Labs  Lab 02/06/22 1656 02/07/22 0420  PROT 8.6* 7.6  ALBUMIN 2.6* 2.2*  AST 20 18  ALT 24 21  ALKPHOS 60 56  BILITOT 0.4 0.7   Lipids No results for input(s): "CHOL", "TRIG", "HDL", "  LABVLDL", "LDLCALC", "CHOLHDL" in the last 168 hours.  Hematology Recent Labs  Lab 02/06/22 1656 02/07/22 0420  WBC 2.0* 1.7*  RBC 2.09* 2.06*  HGB 7.1* 7.0*  HCT 22.0* 21.3*  MCV 105.3* 103.4*  MCH 34.0 34.0  MCHC 32.3 32.9  RDW 26.5* 26.4*  PLT 62* 59*   Thyroid No results for input(s): "TSH", "FREET4" in the last 168 hours.  BNPNo results for input(s): "BNP", "PROBNP" in the last 168 hours.  DDimer  Recent Labs  Lab 02/06/22 1848  DDIMER 1.30*     Radiology/Studies:  CT Angio Chest PE W and/or Wo Contrast  Result Date: 02/06/2022 CLINICAL DATA:  Left-sided chest pain since 2 p.m., history of coronary artery disease, positive D dimer EXAM: CT ANGIOGRAPHY CHEST WITH CONTRAST TECHNIQUE: Multidetector CT imaging of the chest was performed using the standard protocol during bolus administration of intravenous contrast. Multiplanar CT image reconstructions and MIPs were obtained to evaluate the vascular anatomy. RADIATION DOSE REDUCTION: This exam was performed according to the departmental dose-optimization program which includes automated exposure control, adjustment of the mA and/or kV according to patient size and/or use of iterative reconstruction technique. CONTRAST:  15m OMNIPAQUE IOHEXOL 350 MG/ML SOLN COMPARISON:  02/06/2022, 01/16/2022 FINDINGS: Cardiovascular: This is a technically adequate evaluation of the pulmonary vasculature. No filling defects or pulmonary emboli. Dilated main pulmonary artery measuring  3.6 cm consistent with pulmonary arterial hypertension. The heart is unremarkable without pericardial effusion. Diffuse atherosclerosis throughout the coronary vasculature. No evidence of thoracic aortic aneurysm or dissection. Stable aortic atherosclerosis. Left chest wall port via internal jugular approach tip in the superior vena cava. Mediastinum/Nodes: Stable calcified mediastinal lymph nodes consistent with known history of sarcoidosis. Thyroid, trachea, and esophagus are stable. Lungs/Pleura: Chronic areas of bilateral parenchymal scarring, most pronounced within the superior segment of the right lower lobe. No acute airspace disease, effusion, or pneumothorax. The central airways are patent. Upper Abdomen: No acute abnormality. Musculoskeletal: No acute or destructive bony lesions. Reconstructed images demonstrate no additional findings. Review of the MIP images confirms the above findings. IMPRESSION: 1. No evidence of pulmonary embolus. 2. Pulmonary scarring and calcified mediastinal lymph nodes consistent with known history of sarcoidosis. 3. Dilated main pulmonary artery consistent with pulmonary arterial hypertension. 4. Aortic Atherosclerosis (ICD10-I70.0). Coronary artery atherosclerosis. Electronically Signed   By: MRanda NgoM.D.   On: 02/06/2022 20:18   DG Chest Port 1 View  Result Date: 02/06/2022 CLINICAL DATA:  Left-sided chest pain. EXAM: PORTABLE CHEST 1 VIEW COMPARISON:  December 24, 2021 FINDINGS: The right-sided venous Port-A-Cath seen on the prior study has been removed and has been replaced with a left-sided venous Port-A-Cath. Its distal tip is seen at the junction of the superior vena cava and right atrium. The heart size and mediastinal contours are within normal limits. Mild, chronic appearing diffusely increased lung markings are seen. Mild linear atelectasis is noted within the mid to upper right lung. There is no evidence of a pleural effusion or pneumothorax. Multilevel  degenerative changes are seen throughout the thoracic spine. IMPRESSION: Mild, chronic appearing increased lung markings with mild linear atelectasis within the mid to upper right lung. Electronically Signed   By: TVirgina NorfolkM.D.   On: 02/06/2022 16:55     Assessment and Plan:   1. Chest Pain with Atypical Features - He presented with 1 episode of chest discomfort which occurred at rest and this was in the setting of having a Port-A-Cath in place and also due to anemia  with hemoglobin at 7.1 on admission. He denies any recurrent pain overnight or this morning. Was previously doing yard work this past weekend without anginal symptoms. - He has ruled out for ACS with troponin values being negative at 3. EKG is without acute ST changes. An echocardiogram has been ordered by the admitting team and is scheduled for later today. At this time, would not anticipate further ischemic testing given his brief episode, advanced age and pancytopenia.  If he is found to have a new cardiomyopathy, would anticipate medical management as he would not be a candidate for DAPT given his anemia, thrombocytopenia and recent positive occult blood test last month.  2. CAD - He underwent prior intervention to the RCA in 2000 and DES to RCA in 2009 with cath in 2013 showing patent stent with nonobstructive disease along LAD and LCx. Most recent ischemic evaluation was a low-risk NST in 12/2016. - He has ruled out for ACS and will plan to obtain echocardiogram as discussed above. - He remains on ASA 81 mg daily, Atorvastatin 40 mg daily and Imdur (will adjust dosing to his PTA dose of 90 mg in AM/30 mg in PM). Lopressor is currently held given bradycardia on admission but his heart rate has been in the 60's to 70's by review of telemetry. He was on 25 mg in AM/12.5 mg in the PM and may need to adjust to 12.5 mg twice daily prior to discharge given his underlying conduction disease.  3. HTN - His blood pressure was  initially elevated up to 184/67 while in the ED, improved to 124/55 on most recent check. He has been continued on Imdur and Losartan. Would anticipate restarting Lopressor as outlined above with slight dose reduction.   4. HLD - Followed by his PCP as an outpatient. He has been continued on Atorvastatin 40 mg daily.  5. Pancytopenia/Multiple Myeloma - Followed by Oncology. CBC this AM shows WBC 1.7, Hgb 7.0 and platelets 59. Remains on Darzalex and Velcade. Says he was due for chemotherapy today but now canceled. Hospitalist team planing for RBC transfusion today.    For questions or updates, please contact [facility] Please consult [email or web address] for contact info under    Signed, Erma Heritage, PA-C  02/07/2022 8:17 AM

## 2022-02-07 NOTE — Assessment & Plan Note (Signed)
Secondary to volume depletion and poor solute intake Appears to be chronic 130-134

## 2022-02-07 NOTE — Discharge Summary (Signed)
Physician Discharge Summary   Patient: Dale [name] MRN: [id number] DOB: 12-Jun-1928  Admit date:     02/06/2022  Discharge date: 02/08/2022  Discharge Physician: Shanon Brow Shayleigh Bouldin   PCP: Redmond School, MD   Recommendations at discharge:   Please follow up with primary care provider within 1-2 weeks  Please repeat BMP and CBC in one week     Hospital Course: 87 year old male with a history of myeloma, ITP, chronic anemia, coronary artery disease status post stent 2013, hypertension, BPH, left-sided renal cell carcinoma status post recurrent cryoablation, hyperlipidemia, hypothyroidism, first-degree AV block presenting with left-sided chest pain.  The patient stated that his chest pain began around 2 PM while he was sitting on his barstool approximately 30 minutes after he had eaten a chicken sandwich.  He could not describe the sensation of the pain any further.  He stated that it lasted about 2 hours.  He denied any coughing, hemoptysis, shortness of breath, nausea, vomiting, dizziness.  He has not had any recent anginal type symptoms.  He denies any new medications. In the morning of 02/07/2022, the patient still described some left-sided "ache", particularly when he moves a certain way. Notably, the patient was recently mid to the hospital from 01/16/2022 to 01/17/2022 with a syncopal episode.  It was felt to be secondary to symptomatic anemia in the setting of dehydration.  He was transfused 1 unit PRBC and 1 unit platelets. In the ED, the patient was afebrile hemodynamically stable with oxygen saturation 100% room air.  BMP showed sodium 136, potassium 3.8, bicarbonate 22, BUN 17, creatinine 0.78.  LFTs were unremarkable.  WBC 2.0, hemoglobin 7.1, platelets 62,000.  D-dimer 1.30.  Troponin 3>> 3 CTA chest negative for PE, showed bilateral pulmonary scarring and calcified mediastinal lymph nodes.  There is dilated pulmonary artery.  Chest x-ray showed right lower lobe scarring.  Cardiology was  consulted to assist with management.  Assessment and Plan: * Chest pain Atypical by clinical history Troponin 3>> 3 Personally reviewed EKG--sinus rhythm, nonspecific T wave change Personally reviewed chest x-ray--negative consolidation.  Right lower lobe scarring 10/5/ 23 Echo EF 65-70%, no WMA, mild MR CTA chest negative for PE Cardiology consult appreciated>>no further inpatient work up  Multiple myeloma [facility]) Last treatment with  Darzalex and Velcade on 01/31/2021 Follow-up Dr. Delton Coombes  CAD, RCA stent, RCA new DES July 2009, Cath [place] 2011, 02/02/12 Continue Imdur, metoprolol, aspirin, Lipitor  Essential hypertension Continue metoprolol, losartan and Imdur D/c metoprolol at time d/c per cardiology  Acquired hypothyroidism Continue levothyroxine  Anemia associated with chemotherapy Baseline hemoglobin 7-8 Transfuse 1 unit PRBC  Pancytopenia ([place]) Patient has chronic pancytopenia Platelets seem to be near baseline Transfuse one unit PRBC  Hyponatremia Secondary to volume depletion and poor solute intake Appears to be chronic 130-134  Mixed hyperlipidemia Continue statin         Consultants: cardiology Procedures performed: none  Disposition: Home Diet recommendation:  Cardiac diet DISCHARGE MEDICATION: Allergies as of 02/07/2022       Reactions   Azithromycin Other (See Comments)   Sore mouth and fever blisters around mouth, sores in nose area as well   Doxazosin Shortness Of Breath   Atenolol Other (See Comments)   UNKNOWN REACTION   Hydrocodone Nausea And Vomiting   Levofloxacin Other (See Comments)   Caused stomach problems.   Morphine Other (See Comments)   "made me crazy"   Penicillins Nausea And Vomiting, Other (See Comments)   Has patient had a PCN reaction  causing immediate rash, facial/tongue/throat swelling, SOB or lightheadedness with hypotension: No Has patient had a PCN reaction causing severe rash involving mucus membranes or skin  necrosis: No Has patient had a PCN reaction that required hospitalization No Has patient had a PCN reaction occurring within the last 10 years: No If all of the above answers are "NO", then may proceed with Cephalosporin use.   Sulfonamide Derivatives Nausea And Vomiting        Medication List     TAKE these medications    acetaminophen 325 MG tablet Commonly known as: TYLENOL Take 2 tablets (650 mg total) by mouth every 6 (six) hours as needed for mild pain (or Fever >/= 101).   acyclovir 400 MG tablet Commonly known as: ZOVIRAX TAKE 1 TABLET BY MOUTH TWICE  DAILY   aspirin EC 81 MG tablet Take 1 tablet (81 mg total) by mouth daily. Swallow whole. Start taking on: February 08, 2022   atorvastatin 40 MG tablet Commonly known as: LIPITOR TAKE 1 TABLET BY MOUTH IN  THE EVENING What changed: when to take this   brimonidine 0.2 % ophthalmic solution Commonly known as: ALPHAGAN Place 1 drop into the left eye 2 (two) times daily.   cyanocobalamin 1000 MCG tablet Take 1 tablet (1,000 mcg total) by mouth daily.   dexamethasone 2 MG tablet Commonly known as: DECADRON TAKE 5 TABLETS BY MOUTH ONCE  WEEKLY   dorzolamide-timolol 22.3-6.8 MG/ML ophthalmic solution Commonly known as: COSOPT Place 1 drop into the left eye 2 (two) times daily.   feeding supplement Liqd Take 237 mLs by mouth 3 (three) times daily between meals.   furosemide 20 MG tablet Commonly known as: LASIX If you notice any swelling, you may take 40 mg (two tablets) for the day. What changed:  how much to take how to take this when to take this additional instructions   isosorbide mononitrate 60 MG 24 hr tablet Commonly known as: IMDUR Take 1 tablet (60 mg total) by mouth daily. TAKE 1 AND 1/2 TABLETS BY  MOUTH IN THE MORNING AND  1/2 TABLET AT NIGHT   levothyroxine 75 MCG tablet Commonly known as: SYNTHROID Take 75 mcg by mouth daily before breakfast.   lidocaine-prilocaine cream Commonly known  as: EMLA Apply 1 Application topically as needed.   losartan 100 MG tablet Commonly known as: COZAAR TAKE 1 TABLET BY MOUTH  DAILY   metoprolol tartrate 25 MG tablet Commonly known as: LOPRESSOR TAKE 1 TABLET BY MOUTH IN  THE MORNING AND ONE-HALF  TABLET BY MOUTH IN THE  EVENING What changed:  how much to take how to take this when to take this   nitroGLYCERIN 0.4 MG SL tablet Commonly known as: NITROSTAT Place 1 tablet (0.4 mg total) under the tongue every 5 (five) minutes as needed. For chest pain   pantoprazole 40 MG tablet Commonly known as: PROTONIX TAKE 1 TABLET BY MOUTH DAILY  BEFORE BREAKFAST   potassium chloride SA 20 MEQ tablet Commonly known as: KLOR-CON M TAKE 1 TABLET BY MOUTH  DAILY   PreserVision/Lutein Caps Take 1 capsule by mouth 2 (two) times daily.   prochlorperazine 10 MG tablet Commonly known as: COMPAZINE Take 1 tablet (10 mg total) by mouth every 6 (six) hours as needed for nausea or vomiting.   Rocklatan 0.02-0.005 % Soln Generic drug: Netarsudil-Latanoprost Place 1 drop into the left eye at bedtime.   triamcinolone cream 0.1 % Commonly known as: KENALOG Apply 1 application. topically 2 (two) times  daily as needed (for irritation).   VELCADE IJ Inject as directed once a week.   VITAMIN C PO Take 500 mg by mouth every evening.   vitamin E 200 UNIT capsule Take 200 Units by mouth every evening.        Discharge Exam: Filed Weights   02/06/22 1601 02/06/22 2233  Weight: 54.4 kg 54.7 kg   HEENT:  [place]/AT, No thrush, no icterus CV:  RRR, no rub, no S3, no S4 Lung:  CTA, no wheeze, no rhonchi Abd:  soft/+BS, NT Ext:  No edema, no lymphangitis, no synovitis, no rash   Condition at discharge: stable  The results of significant diagnostics from this hospitalization (including imaging, microbiology, ancillary and laboratory) are listed below for reference.   Imaging Studies: ECHOCARDIOGRAM COMPLETE  Result Date: 02/07/2022     ECHOCARDIOGRAM REPORT   Patient Name:   SHANTEL WESELY Date of Exam: 02/07/2022 Medical Rec #:  [id number]      Height:       68.0 in Accession #:    [id number]     Weight:       120.6 lb Date of Birth:  25-Dec-1928      BSA:          1.648 m Patient Age:    30 years       BP:           124/55 mmHg Patient Gender: M              HR:           72 bpm. Exam Location:  Inpatient Procedure: 3D Echo, 2D Echo, Cardiac Doppler and Color Doppler Indications:    Chest Pain R07.9  History:        Patient has prior history of Echocardiogram examinations, most                 recent 05/10/2021. CHF; Risk Factors:Hypertension and                 Dyslipidemia. GERD.  Sonographer:    Darlina Sicilian RDCS Referring Phys: [phone] Kolter Reaver IMPRESSIONS  1. Left ventricular ejection fraction, by estimation, is 65 to 70%. The left ventricle has normal function. The left ventricle has no regional wall motion abnormalities. Left ventricular diastolic parameters were normal.  2. Right ventricular systolic function is normal. The right ventricular size is normal.  3. The mitral valve is normal in structure. Mild mitral valve regurgitation.  4. The aortic valve is tricuspid. Aortic valve regurgitation is not visualized.  5. The inferior vena cava is normal in size with greater than 50% respiratory variability, suggesting right atrial pressure of 3 mmHg. Comparison(s): The left ventricular function is unchanged. FINDINGS  Left Ventricle: Left ventricular ejection fraction, by estimation, is 65 to 70%. The left ventricle has normal function. The left ventricle has no regional wall motion abnormalities. The left ventricular internal cavity size was normal in size. There is  no left ventricular hypertrophy. Left ventricular diastolic parameters were normal. Right Ventricle: The right ventricular size is normal. Right vetricular wall thickness was not assessed. Right ventricular systolic function is normal. Left Atrium: Left atrial size was normal in  size. Right Atrium: Right atrial size was normal in size. Pericardium: There is no evidence of pericardial effusion. Mitral Valve: The mitral valve is normal in structure. Mild mitral valve regurgitation. Tricuspid Valve: The tricuspid valve is normal in structure. Tricuspid valve regurgitation is mild. Aortic Valve: The aortic valve  is tricuspid. Aortic valve regurgitation is not visualized. Pulmonic Valve: The pulmonic valve was normal in structure. Pulmonic valve regurgitation is not visualized. Aorta: The aortic root and ascending aorta are structurally normal, with no evidence of dilitation. Venous: The inferior vena cava is normal in size with greater than 50% respiratory variability, suggesting right atrial pressure of 3 mmHg. IAS/Shunts: No atrial level shunt detected by color flow Doppler.  LEFT VENTRICLE PLAX 2D LVIDd:         4.20 cm   Diastology LVIDs:         2.90 cm   LV e' medial:    7.29 cm/s LV PW:         1.10 cm   LV E/e' medial:  12.2 LV IVS:        1.10 cm   LV e' lateral:   8.92 cm/s LVOT diam:     2.10 cm   LV E/e' lateral: 10.0 LV SV:         73 LV SV Index:   45 LVOT Area:     3.46 cm                           3D Volume EF:                          3D EF:        73 %                          LV EDV:       126 ml                          LV ESV:       34 ml                          LV SV:        92 ml RIGHT VENTRICLE RV S prime:     16.50 cm/s TAPSE (M-mode): 1.4 cm LEFT ATRIUM             Index        RIGHT ATRIUM           Index LA diam:        3.60 cm 2.18 cm/m   RA Area:     11.80 cm LA Vol (A2C):   40.3 ml 24.45 ml/m  RA Volume:   23.40 ml  14.20 ml/m LA Vol (A4C):   34.9 ml 21.17 ml/m LA Biplane Vol: 41.6 ml 25.24 ml/m  AORTIC VALVE LVOT Vmax:   118.00 cm/s LVOT Vmean:  76.500 cm/s LVOT VTI:    0.212 m  AORTA Ao Root diam: 3.00 cm Ao Asc diam:  3.20 cm MITRAL VALVE MV Area (PHT): 4.04 cm    SHUNTS MV Decel Time: 188 msec    Systemic VTI:  0.21 m MV E velocity: 89.10 cm/s   Systemic Diam: 2.10 cm MV A velocity: 87.80 cm/s MV E/A ratio:  1.01 Dorris Carnes MD Electronically signed by Dorris Carnes MD Signature Date/Time: 02/07/2022/5:14:29 PM    Final    CT Angio Chest PE W and/or Wo Contrast  Result Date: 02/06/2022 CLINICAL DATA:  Left-sided chest pain since 2 p.m., history of coronary artery disease, positive D dimer EXAM: CT ANGIOGRAPHY CHEST WITH  CONTRAST TECHNIQUE: Multidetector CT imaging of the chest was performed using the standard protocol during bolus administration of intravenous contrast. Multiplanar CT image reconstructions and MIPs were obtained to evaluate the vascular anatomy. RADIATION DOSE REDUCTION: This exam was performed according to the departmental dose-optimization program which includes automated exposure control, adjustment of the mA and/or kV according to patient size and/or use of iterative reconstruction technique. CONTRAST:  22m OMNIPAQUE IOHEXOL 350 MG/ML SOLN COMPARISON:  02/06/2022, 01/16/2022 FINDINGS: Cardiovascular: This is a technically adequate evaluation of the pulmonary vasculature. No filling defects or pulmonary emboli. Dilated main pulmonary artery measuring 3.6 cm consistent with pulmonary arterial hypertension. The heart is unremarkable without pericardial effusion. Diffuse atherosclerosis throughout the coronary vasculature. No evidence of thoracic aortic aneurysm or dissection. Stable aortic atherosclerosis. Left chest wall port via internal jugular approach tip in the superior vena cava. Mediastinum/Nodes: Stable calcified mediastinal lymph nodes consistent with known history of sarcoidosis. Thyroid, trachea, and esophagus are stable. Lungs/Pleura: Chronic areas of bilateral parenchymal scarring, most pronounced within the superior segment of the right lower lobe. No acute airspace disease, effusion, or pneumothorax. The central airways are patent. Upper Abdomen: No acute abnormality. Musculoskeletal: No acute or destructive bony lesions.  Reconstructed images demonstrate no additional findings. Review of the MIP images confirms the above findings. IMPRESSION: 1. No evidence of pulmonary embolus. 2. Pulmonary scarring and calcified mediastinal lymph nodes consistent with known history of sarcoidosis. 3. Dilated main pulmonary artery consistent with pulmonary arterial hypertension. 4. Aortic Atherosclerosis (ICD10-I70.0). Coronary artery atherosclerosis. Electronically Signed   By: MRanda NgoM.D.   On: 02/06/2022 20:18   DG Chest Port 1 View  Result Date: 02/06/2022 CLINICAL DATA:  Left-sided chest pain. EXAM: PORTABLE CHEST 1 VIEW COMPARISON:  December 24, 2021 FINDINGS: The right-sided venous Port-A-Cath seen on the prior study has been removed and has been replaced with a left-sided venous Port-A-Cath. Its distal tip is seen at the junction of the superior vena cava and right atrium. The heart size and mediastinal contours are within normal limits. Mild, chronic appearing diffusely increased lung markings are seen. Mild linear atelectasis is noted within the mid to upper right lung. There is no evidence of a pleural effusion or pneumothorax. Multilevel degenerative changes are seen throughout the thoracic spine. IMPRESSION: Mild, chronic appearing increased lung markings with mild linear atelectasis within the mid to upper right lung. Electronically Signed   By: TVirgina NorfolkM.D.   On: 02/06/2022 16:55   CT CHEST ABDOMEN PELVIS W CONTRAST  Result Date: 01/16/2022 CLINICAL DATA:  Trauma, fall EXAM: CT CHEST, ABDOMEN, AND PELVIS WITH CONTRAST TECHNIQUE: Multidetector CT imaging of the chest, abdomen and pelvis was performed following the standard protocol during bolus administration of intravenous contrast. RADIATION DOSE REDUCTION: This exam was performed according to the departmental dose-optimization program which includes automated exposure control, adjustment of the mA and/or kV according to patient size and/or use of iterative  reconstruction technique. CONTRAST:  890mOMNIPAQUE IOHEXOL 300 MG/ML  SOLN COMPARISON:  Previous studies including the CT abdomen done on 06/14/2021 FINDINGS: CT CHEST FINDINGS Cardiovascular: There is homogeneous enhancement in thoracic aorta. There are no intraluminal filling defects in central pulmonary artery branches. There is ectasia of the main pulmonary artery measuring 3.4 cm suggesting pulmonary arterial hypertension. Coronary artery calcifications are seen. Mediastinum/Nodes: There is no mediastinal hematoma. Left IJ chest port is noted with the tip of the catheter in superior vena cava. There are pockets of air in subcutaneous plane in the anterior  chest, more so on the left side, possibly suggesting recent accessing of the chest port. There is a 2.4 cm loculated air containing structure in subcutaneous plane in the right anterior chest wall. Lungs/Pleura: Centrilobular and panlobular emphysema is seen. Increased interstitial markings are seen in the posterior segment of right upper lobe and in posterior right lower lobe. There is ectasia bronchi in the area of increased interstitial markings. Findings may suggest scarring or interstitial pneumonia. There are linear densities in right middle lobe and lingula suggesting scarring or subsegmental atelectasis. Minimal left pleural effusion is seen. There is no pneumothorax. Musculoskeletal: No acute findings are seen in bony structures in the thorax. CT ABDOMEN PELVIS FINDINGS Hepatobiliary: There are few low-density lesions in liver largest measuring 3.4 cm in the left lobe, possibly cysts. There is no dilation of bile ducts. Gallbladder stones are seen. There is no wall thickening in gallbladder. Pancreas: No focal abnormalities are seen. Spleen: Unremarkable. Adrenals/Urinary Tract: Adrenals are unremarkable. There is no hydronephrosis. There is 2 mm calculus in the right kidney. There is 1 mm calculus in the lower pole of left kidney. There is focal  cortical thinning in the lateral aspect of left kidney, possibly related to previous intervention or pyelonephritis. There are no ureteral stones. Urinary bladder is unremarkable. Stomach/Bowel: Stomach is unremarkable. Small bowel loops are not dilated. Appendix is not dilated. There is no significant wall thickening in colon. There is no pericardial stranding. Diverticula are seen in the colon without signs of focal acute diverticulitis. Vascular/Lymphatic: Scattered calcifications are seen in aorta and its major branches. No significant lymphadenopathy is seen. Reproductive: Unremarkable. Other: There is no ascites or pneumoperitoneum. Right inguinal hernia containing fat is seen. Musculoskeletal: No recent fracture is seen in bony structures. IMPRESSION: No acute findings are seen in CT scan of chest, abdomen and pelvis. There is no focal pulmonary consolidation. There is no pleural effusion or pneumothorax. There is no evidence of mediastinal hematoma. Coronary artery disease. Ectasia of the main pulmonary artery suggests pulmonary arterial hypertension. There is no laceration in the solid organs. There is no bowel wall thickening. There is no ascites or pneumoperitoneum. No displaced fractures are seen in bony structures. There is loculated pocket of air in the right anterior chest wall, most likely suggesting recent removal of right chest port. There are pockets of air adjacent to left IJ chest port suggesting recent placement of left chest port. COPD. There are scattered foci of increased interstitial markings, especially in posterior segment of right upper lobe and superior segment of right lower lobe suggesting scarring. Hepatic cysts. Small bilateral renal stones. Diverticulosis of colon. Other findings as described in the body of the report. Electronically Signed   By: Elmer Picker M.D.   On: 01/16/2022 17:30   CT Head Wo Contrast  Result Date: 01/16/2022 CLINICAL DATA:  Fall, neck trauma  EXAM: CT HEAD WITHOUT CONTRAST CT CERVICAL SPINE WITHOUT CONTRAST TECHNIQUE: Multidetector CT imaging of the head and cervical spine was performed following the standard protocol without intravenous contrast. Multiplanar CT image reconstructions of the cervical spine were also generated. RADIATION DOSE REDUCTION: This exam was performed according to the departmental dose-optimization program which includes automated exposure control, adjustment of the mA and/or kV according to patient size and/or use of iterative reconstruction technique. COMPARISON:  CT head 01/04/2021 FINDINGS: CT HEAD FINDINGS Brain: Generalized atrophy without hydrocephalus. Patchy white matter hypodensity bilaterally unchanged. Negative for acute infarct, hemorrhage, mass Vascular: Negative for hyperdense vessel Skull: Negative calvarium Sinuses/Orbits:  Expansile soft tissue mass in the left maxillary antrum is unchanged. This extends into the right nasal cavity. No aggressive bony destruction. Other: None CT CERVICAL SPINE FINDINGS Alignment: Normal Skull base and vertebrae: Negative for fracture or mass Soft tissues and spinal canal: No soft tissue mass or adenopathy. Atherosclerotic calcification of the carotid bifurcation bilaterally left jugular vascular catheter with gas in the soft tissues likely due to recent placement. Disc levels: Mild disc degeneration. Moderate bilateral facet degeneration. Upper chest: Apical emphysema without acute abnormality Other: None IMPRESSION: 1. No acute intracranial injury. 2. Negative for cervical spine fracture 3. Expansile soft tissue mass in the left maxillary sinus extending into the left nasal cavity. This is unchanged from CT of 01/04/2021. 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 Electronically Signed   By: Franchot Gallo M.D.   On: 01/16/2022 17:17   CT Cervical Spine Wo Contrast  Result Date: 01/16/2022 CLINICAL DATA:  Fall, neck trauma EXAM: CT HEAD WITHOUT CONTRAST CT CERVICAL SPINE WITHOUT CONTRAST  TECHNIQUE: Multidetector CT imaging of the head and cervical spine was performed following the standard protocol without intravenous contrast. Multiplanar CT image reconstructions of the cervical spine were also generated. RADIATION DOSE REDUCTION: This exam was performed according to the departmental dose-optimization program which includes automated exposure control, adjustment of the mA and/or kV according to patient size and/or use of iterative reconstruction technique. COMPARISON:  CT head 01/04/2021 FINDINGS: CT HEAD FINDINGS Brain: Generalized atrophy without hydrocephalus. Patchy white matter hypodensity bilaterally unchanged. Negative for acute infarct, hemorrhage, mass Vascular: Negative for hyperdense vessel Skull: Negative calvarium Sinuses/Orbits: Expansile soft tissue mass in the left maxillary antrum is unchanged. This extends into the right nasal cavity. No aggressive bony destruction. Other: None CT CERVICAL SPINE FINDINGS Alignment: Normal Skull base and vertebrae: Negative for fracture or mass Soft tissues and spinal canal: No soft tissue mass or adenopathy. Atherosclerotic calcification of the carotid bifurcation bilaterally left jugular vascular catheter with gas in the soft tissues likely due to recent placement. Disc levels: Mild disc degeneration. Moderate bilateral facet degeneration. Upper chest: Apical emphysema without acute abnormality Other: None IMPRESSION: 1. No acute intracranial injury. 2. Negative for cervical spine fracture 3. Expansile soft tissue mass in the left maxillary sinus extending into the left nasal cavity. This is unchanged from CT of 01/04/2021. 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 Electronically Signed   By: Franchot Gallo M.D.   On: 01/16/2022 17:17   IR IMAGING GUIDED PORT INSERTION  Result Date: 01/15/2022 INDICATION: Port malfunction on recently placed port, 12/18/2021. Hub detachment. EXAM: Procedures: 1. IMPLANTED PORT A CATH PLACEMENT WITH ULTRASOUND AND  FLUOROSCOPIC GUIDANCE 2. REMOVAL OF IMPLANTED TUNNELED PORT-A-CATH MEDICATIONS: Ancef 2 gm IV; The antibiotic was administered within an appropriate time interval prior to skin puncture. ANESTHESIA/SEDATION: Moderate (conscious) sedation was employed during this procedure. A total of Versed 2 mg and Fentanyl 100 mcg was administered intravenously. Moderate Sedation Time: 58 minutes. The patient's level of consciousness and vital signs were monitored continuously by radiology nursing throughout the procedure under my direct supervision. FLUOROSCOPY TIME:  Fluoroscopic dose; 2 mGy COMPLICATIONS: None immediate. PROCEDURE: The procedure, risks, benefits, and alternatives were explained to the patient and/or patient's representative . Questions regarding the procedure were encouraged and answered. The patient and/or patient's representative understands and consents to the procedure. PORT PLACEMENT; The LEFT neck and chest was prepped with chlorhexidine in a sterile fashion, and a sterile drape was applied covering the operative field. Maximum barrier sterile technique with sterile gowns and gloves were used for  the procedure. A timeout was performed prior to the initiation of the procedure. Local anesthesia was provided with 1% lidocaine with epinephrine. After creating a small venotomy incision, a micropuncture kit was utilized to access the internal jugular vein under direct, real-time ultrasound guidance. Ultrasound image documentation was performed. The microwire was kinked to measure appropriate catheter length. A subcutaneous port pocket was then created along the upper chest wall utilizing a combination of sharp and blunt dissection. The pocket was irrigated with sterile saline. A single lumen Non-ISP power injectable port was chosen for placement. The 8 Fr catheter was tunneled from the port pocket site to the venotomy incision. The port was placed in the pocket. The external catheter was trimmed to appropriate  length. At the venotomy, an 8 Fr peel-away sheath was placed over a guidewire under fluoroscopic guidance. The catheter was then placed through the sheath and the sheath was removed. Final catheter positioning was confirmed and documented with a fluoroscopic spot radiograph. The port was accessed with a Huber needle, aspirated and flushed with heparinized saline. The port pocket incision was closed with interrupted 3-0 Vicryl suture then Dermabond was applied, including at the venotomy incision. Dressings were placed. PORT REMOVAL; Attention was then directed to the RIGHT chest and previously placed Port-A-Cath. The site had been prepped with chlorhexidine. Local anesthesia was provided with 1% lidocaine with epinephrine. A timeout was performed prior to the initiation of the procedure. An incision was made overlying the Port-A-Cath with a scalpel. Utilizing sharp and blunt dissection, the Port-A-Cath was removed completely. The pocked was irrigated with sterile saline. Wound closure was performed with interrupted subcutaneous 2-0 Vicryl sutures then Dermabond was applied at the skin. Dressings were applied. The patient tolerated the procedures well without immediate post procedural complication. IMPRESSION: 1. Successful placement of a LEFT chest internal jugular approach power injectable Port-A-Cath. The tip of the catheter is positioned within the superior cavoatrial junction. The catheter is ready for immediate use. 2. Successful removal of implanted RIGHT chest Port-A-Cath without immediate post procedural complication. Michaelle Birks, MD [facility] Radiology Electronically Signed   By: Michaelle Birks M.D.   On: 01/15/2022 23:15   IR REMOVAL TUN ACCESS W/ PORT W/O FL MOD SED  Result Date: 01/15/2022 INDICATION: Port malfunction on recently placed port, 12/18/2021. Hub detachment. EXAM: Procedures: 1. IMPLANTED PORT A CATH PLACEMENT WITH ULTRASOUND AND  FLUOROSCOPIC GUIDANCE 2. REMOVAL OF IMPLANTED TUNNELED PORT-A-CATH MEDICATIONS: Ancef 2 gm IV; The antibiotic was administered within an appropriate time interval prior to skin puncture. ANESTHESIA/SEDATION: Moderate (conscious) sedation was employed during this procedure. A total of Versed 2 mg and Fentanyl 100 mcg was administered intravenously. Moderate Sedation Time: 58 minutes. The patient's level of consciousness and vital signs were monitored continuously by radiology nursing throughout the procedure under my direct supervision. FLUOROSCOPY TIME:  Fluoroscopic dose; 2 mGy COMPLICATIONS: None immediate. PROCEDURE: The procedure, risks, benefits, and alternatives were explained to the patient and/or patient's representative . Questions regarding the procedure were encouraged and answered. The patient and/or patient's representative understands and consents to the procedure. PORT PLACEMENT; The LEFT neck and chest was prepped with chlorhexidine in a sterile fashion, and a sterile drape was applied covering the operative field. Maximum barrier sterile technique with sterile gowns and gloves were used for the procedure. A timeout was performed prior to the initiation of the procedure. Local anesthesia was provided with 1% lidocaine with epinephrine. After creating a small venotomy incision, a micropuncture kit was  utilized to access the internal jugular vein under direct, real-time ultrasound guidance. Ultrasound image documentation was performed. The microwire was kinked to measure appropriate catheter length. A subcutaneous port pocket was then created along the upper chest wall utilizing a combination of sharp and blunt dissection. The pocket was irrigated with sterile saline. A single lumen Non-ISP power injectable port was chosen for placement. The 8 Fr catheter was tunneled from the port pocket site to the venotomy incision. The port was placed in the pocket. The external catheter was trimmed to appropriate  length. At the venotomy, an 8 Fr peel-away sheath was placed over a guidewire under fluoroscopic guidance. The catheter was then placed through the sheath and the sheath was removed. Final catheter positioning was confirmed and documented with a fluoroscopic spot radiograph. The port was accessed with a Huber needle, aspirated and flushed with heparinized saline. The port pocket incision was closed with interrupted 3-0 Vicryl suture then Dermabond was applied, including at the venotomy incision. Dressings were placed. PORT REMOVAL; Attention was then directed to the RIGHT chest and previously placed Port-A-Cath. The site had been prepped with chlorhexidine. Local anesthesia was provided with 1% lidocaine with epinephrine. A timeout was performed prior to the initiation of the procedure. An incision was made overlying the Port-A-Cath with a scalpel. Utilizing sharp and blunt dissection, the Port-A-Cath was removed completely. The pocked was irrigated with sterile saline. Wound closure was performed with interrupted subcutaneous 2-0 Vicryl sutures then Dermabond was applied at the skin. Dressings were applied. The patient tolerated the procedures well without immediate post procedural complication. IMPRESSION: 1. Successful placement of a LEFT chest internal jugular approach power injectable Port-A-Cath. The tip of the catheter is positioned within the superior cavoatrial junction. The catheter is ready for immediate use. 2. Successful removal of implanted RIGHT chest Port-A-Cath without immediate post procedural complication. Michaelle Birks, MD [facility] Radiology Electronically Signed   By: Michaelle Birks M.D.   On: 01/15/2022 23:15    Microbiology: Results for orders placed or performed during the hospital encounter of 01/12/20  SARS Coronavirus 2 by RT PCR (hospital order, performed in [facility] hospital lab) Nasopharyngeal Nasopharyngeal Swab     Status: None    Collection Time: 01/12/20  1:19 PM   Specimen: Nasopharyngeal Swab  Result Value Ref Range Status   SARS Coronavirus 2 NEGATIVE NEGATIVE Final    Comment: (NOTE) SARS-CoV-2 target nucleic acids are NOT DETECTED.  The SARS-CoV-2 RNA is generally detectable in upper and lower respiratory specimens during the acute phase of infection. The lowest concentration of SARS-CoV-2 viral copies this assay can detect is 250 copies / mL. A negative result does not preclude SARS-CoV-2 infection and should not be used as the sole basis for treatment or other patient management decisions.  A negative result may occur with improper specimen collection / handling, submission of specimen other than nasopharyngeal swab, presence of viral mutation(s) within the areas targeted by this assay, and inadequate number of viral copies (<250 copies / mL). A negative result must be combined with clinical observations, patient history, and epidemiological information.  Fact Sheet for Patients:   StrictlyIdeas.no  Fact Sheet for Healthcare Providers: BankingDealers.co.za  This test is not yet approved or  cleared by the [place] FDA and has been authorized for detection and/or diagnosis of SARS-CoV-2 by FDA under an Emergency Use Authorization (EUA).  This EUA will remain in effect (meaning this test can be used) for the duration of  the COVID-19 declaration under Section 564(b)(1) of the Act, 21 U.S.C. section 360bbb-3(b)(1), unless the authorization is terminated or revoked sooner.  Performed at [facility], [address], [place], [place] [address]     Labs: CBC: Recent Labs  Lab 02/06/22 1656 02/07/22 0420  WBC 2.0* 1.7*  HGB 7.1* 7.0*  HCT 22.0* 21.3*  MCV 105.3* 103.4*  PLT 62* 59*   Basic Metabolic Panel: Recent Labs  Lab 02/06/22 1656 02/07/22 0420  NA 132* 132*  K 4.2 3.8  CL 104 104  CO2 24 22  GLUCOSE 106* 90  BUN 19 17   CREATININE 0.85 0.78  CALCIUM 8.4* 8.2*  MG  --  2.0  PHOS  --  2.4*   Liver Function Tests: Recent Labs  Lab 02/06/22 1656 02/07/22 0420  AST 20 18  ALT 24 21  ALKPHOS 60 56  BILITOT 0.4 0.7  PROT 8.6* 7.6  ALBUMIN 2.6* 2.2*   CBG: No results for input(s): "GLUCAP" in the last 168 hours.  Discharge time spent: greater than 30 minutes.  Signed: Orson Eva, MD [facility] 02/07/2022

## 2022-02-07 NOTE — Care Management Obs Status (Signed)
[facility] NOTIFICATION   Patient Details  Name: Dale [name] MRN: [id number] Date of Birth: Oct 05, 1928   Medicare Observation Status Notification Given:  Yes    [name] 02/07/2022, 4:33 PM

## 2022-02-07 NOTE — Hospital Course (Addendum)
88 year old male with a history of myeloma, ITP, chronic anemia, coronary artery disease status post stent 2013, hypertension, BPH, left-sided renal cell carcinoma status post recurrent cryoablation, hyperlipidemia, hypothyroidism, first-degree AV block presenting with left-sided chest pain.  The patient stated that his chest pain began around 2 PM while he was sitting on his barstool approximately 30 minutes after he had eaten a chicken sandwich.  He could not describe the sensation of the pain any further.  He stated that it lasted about 2 hours.  He denied any coughing, hemoptysis, shortness of breath, nausea, vomiting, dizziness.  He has not had any recent anginal type symptoms.  He denies any new medications. In the morning of 02/07/2022, the patient still described some left-sided "ache", particularly when he moves a certain way. Notably, the patient was recently mid to the hospital from 01/16/2022 to 01/17/2022 with a syncopal episode.  It was felt to be secondary to symptomatic anemia in the setting of dehydration.  He was transfused 1 unit PRBC and 1 unit platelets. In the ED, the patient was afebrile hemodynamically stable with oxygen saturation 100% room air.  BMP showed sodium 136, potassium 3.8, bicarbonate 22, BUN 17, creatinine 0.78.  LFTs were unremarkable.  WBC 2.0, hemoglobin 7.1, platelets 62,000.  D-dimer 1.30.  Troponin 3>> 3 CTA chest negative for PE, showed bilateral pulmonary scarring and calcified mediastinal lymph nodes.  There is dilated pulmonary artery.  Chest x-ray showed right lower lobe scarring.  Cardiology was consulted to assist with management.  Echo was reassuring.  Cardiology saw patient and did not recommend any further inpatient testing.  He received 1 unit PRBC during the hospitalization.  D/c was delayed till 10/6 due to time required to find compatible PRBC due to antibodies.  Hgb 8.0 on day of d/c

## 2022-02-07 NOTE — Progress Notes (Signed)
  Transition of Care [facility]) Screening Note   Patient Details  Name: Dale [name] Date of Birth: 1928/07/06   Transition of Care [facility]) CM/SW Contact:    Ihor Gully, LCSW Phone Number: 02/07/2022, 12:47 PM    Transition of Care Department [facility]) has reviewed patient and no TOC needs have been identified at this time. We will continue to monitor patient advancement through interdisciplinary progression rounds. If new patient transition needs arise, please place a TOC consult.

## 2022-02-07 NOTE — Assessment & Plan Note (Signed)
Continue levothyroxine 

## 2022-02-07 NOTE — Assessment & Plan Note (Signed)
Last treatment with  Darzalex and Velcade on 01/31/2021 Follow-up Dr. Delton Coombes

## 2022-02-07 NOTE — Assessment & Plan Note (Addendum)
Patient has chronic pancytopenia Platelets seem to be near baseline Transfused one unit PRBC during this admit Hgb 8.0 at time of d/c

## 2022-02-07 NOTE — Progress Notes (Signed)
Pt's "neighbor" called and wanted update on pt from [phone]. Pt was instructed by nursing staff that unless listed as emergency contact on the pt's file due to HIPPA she would need to contact the wife JOY or son MARK. Caller disconnected.

## 2022-02-07 NOTE — Assessment & Plan Note (Signed)
Continue statin. 

## 2022-02-07 NOTE — Assessment & Plan Note (Addendum)
Baseline hemoglobin 7-8 Transfused 1 unit PRBC this admission Hgb 8.0 at time of dc

## 2022-02-07 NOTE — Assessment & Plan Note (Addendum)
Continue Imdur,  aspirin, Lipitor

## 2022-02-07 NOTE — Progress Notes (Signed)
Pt's wife is at bedside pt is having lunch. Pt is able to make needs known. Denies any pain or discomfort.

## 2022-02-08 DIAGNOSIS — I10 Essential (primary) hypertension: Secondary | ICD-10-CM | POA: Diagnosis not present

## 2022-02-08 DIAGNOSIS — R0789 Other chest pain: Secondary | ICD-10-CM | POA: Diagnosis not present

## 2022-02-08 DIAGNOSIS — E039 Hypothyroidism, unspecified: Secondary | ICD-10-CM | POA: Diagnosis not present

## 2022-02-08 DIAGNOSIS — R072 Precordial pain: Secondary | ICD-10-CM

## 2022-02-08 DIAGNOSIS — E871 Hypo-osmolality and hyponatremia: Secondary | ICD-10-CM | POA: Diagnosis not present

## 2022-02-08 DIAGNOSIS — C9000 Multiple myeloma not having achieved remission: Secondary | ICD-10-CM | POA: Diagnosis not present

## 2022-02-08 DIAGNOSIS — D6481 Anemia due to antineoplastic chemotherapy: Secondary | ICD-10-CM | POA: Diagnosis not present

## 2022-02-08 DIAGNOSIS — T451X5A Adverse effect of antineoplastic and immunosuppressive drugs, initial encounter: Secondary | ICD-10-CM | POA: Diagnosis not present

## 2022-02-08 LAB — CBC
HCT: 24.2 % — ABNORMAL LOW (ref 39.0–52.0)
Hemoglobin: 8 g/dL — ABNORMAL LOW (ref 13.0–17.0)
MCH: 33.5 pg (ref 26.0–34.0)
MCHC: 33.1 g/dL (ref 30.0–36.0)
MCV: 101.3 fL — ABNORMAL HIGH (ref 80.0–100.0)
Platelets: 55 10*3/uL — ABNORMAL LOW (ref 150–400)
RBC: 2.39 MIL/uL — ABNORMAL LOW (ref 4.22–5.81)
RDW: 25.1 % — ABNORMAL HIGH (ref 11.5–15.5)
WBC: 1.7 10*3/uL — ABNORMAL LOW (ref 4.0–10.5)
nRBC: 0 % (ref 0.0–0.2)

## 2022-02-08 NOTE — Progress Notes (Signed)
    Please refer to the full Cardiology consult note from 02/07/2022. Echocardiogram yesterday showed a preserved EF of 65-70% with no regional WMA and no significant valve abnormalities. Was recommended to stop Lopressor at discharge due to bradycardia. He does have previously scheduled follow-up with [name] next month and will keep that visit.   Signed, Erma Heritage, PA-C 02/08/2022, 8:32 AM Pager: [phone]

## 2022-02-08 NOTE — Plan of Care (Signed)

## 2022-02-08 NOTE — Progress Notes (Signed)
Pt was picked up by his family friend. Pt was able to make needs known. Pt was transported by wheelchair to the main entrance where he was able to hop up in his friends green pick up truck. Pt denied and pain or discomfort. Pt was given and AVS was reviewed by [place] IV  was pulled at that time.

## 2022-02-08 NOTE — Progress Notes (Signed)
PROGRESS NOTE  Dale [name] TJQ:[id number] DOB: Jun 18, 1928 DOA: 02/06/2022 PCP: Redmond School, MD  Brief History:  88 year old male with a history of myeloma, ITP, chronic anemia, coronary artery disease status post stent 2013, hypertension, BPH, left-sided renal cell carcinoma status post recurrent cryoablation, hyperlipidemia, hypothyroidism, first-degree AV block presenting with left-sided chest pain.  The patient stated that his chest pain began around 2 PM while he was sitting on his barstool approximately 30 minutes after he had eaten a chicken sandwich.  He could not describe the sensation of the pain any further.  He stated that it lasted about 2 hours.  He denied any coughing, hemoptysis, shortness of breath, nausea, vomiting, dizziness.  He has not had any recent anginal type symptoms.  He denies any new medications. In the morning of 02/07/2022, the patient still described some left-sided "ache", particularly when he moves a certain way. Notably, the patient was recently mid to the hospital from 01/16/2022 to 01/17/2022 with a syncopal episode.  It was felt to be secondary to symptomatic anemia in the setting of dehydration.  He was transfused 1 unit PRBC and 1 unit platelets. In the ED, the patient was afebrile hemodynamically stable with oxygen saturation 100% room air.  BMP showed sodium 136, potassium 3.8, bicarbonate 22, BUN 17, creatinine 0.78.  LFTs were unremarkable.  WBC 2.0, hemoglobin 7.1, platelets 62,000.  D-dimer 1.30.  Troponin 3>> 3 CTA chest negative for PE, showed bilateral pulmonary scarring and calcified mediastinal lymph nodes.  There is dilated pulmonary artery.  Chest x-ray showed right lower lobe scarring.  Cardiology was consulted to assist with management.  Echo was reassuring.  Cardiology saw patient and did not recommend any further inpatient testing.  He received 1 unit PRBC during the hospitalization.  D/c was delayed till 10/6 due to time required to  find compatible PRBC due to antibodies.  Hgb 8.0 on day of d/c    Assessment and Plan: * Chest pain Atypical by clinical history Troponin 3>> 3 Personally reviewed EKG--sinus rhythm, nonspecific T wave change Personally reviewed chest x-ray--negative consolidation.  Right lower lobe scarring 10/5/ 23 Echo EF 65-70%, no WMA, mild MR CTA chest negative for PE Cardiology consult appreciated>>no further inpatient work up  Multiple myeloma [facility]) Last treatment with  Darzalex and Velcade on 01/31/2021 Follow-up Dr. Delton Coombes  CAD, RCA stent, RCA new DES July 2009, Cath [place] 2011, 02/02/12 Continue Imdur,  aspirin, Lipitor  Essential hypertension Continue metoprolol, losartan and Imdur for BP goal <140/90  Acquired hypothyroidism Continue levothyroxine  Anemia associated with chemotherapy Baseline hemoglobin 7-8 Transfused 1 unit PRBC this admission Hgb 8.0 at time of dc  Pancytopenia ([place]) Patient has chronic pancytopenia Platelets seem to be near baseline Transfused one unit PRBC during this admit Hgb 8.0 at time of d/c  Hyponatremia Secondary to volume depletion and poor solute intake Appears to be chronic 130-134  Mixed hyperlipidemia Continue statin          Family Communication:   no Family at bedside  Consultants:  cardiology  Code Status:  FULL   DVT Prophylaxis:  SCDs   Procedures: As Listed in Progress Note Above  Antibiotics: None      Subjective: Patient has intermittent left side chest discomfort with specific movements.  Improved with not moving.  Denies f/c, sob, n/v/d, abd pain  Objective: Vitals:   02/07/22 1716 02/07/22 2014 02/07/22 2150 02/08/22 0503  BP: (!) 151/71 (!) 161/68  132/76 (!) 152/62  Pulse: 85 79 84 65  Resp: 18 20 20 18   Temp: 98.2 F (36.8 C) 98.6 F (37 C) 98.1 F (36.7 C) 97.8 F (36.6 C)  TempSrc: Oral Oral Oral   SpO2: 100% 100% 100% 100%  Weight:      Height:        Intake/Output Summary (Last 24  hours) at 02/08/2022 0804 Last data filed at 02/07/2022 2014 Gross per 24 hour  Intake 899.17 ml  Output 600 ml  Net 299.17 ml   Weight change:  Exam:  General:  Pt is alert, follows commands appropriately, not in acute distress HEENT: No icterus, No thrush, No neck mass, [place]/AT Cardiovascular: RRR, S1/S2, no rubs, no gallops Respiratory: fine bibasilar crackles. No wheeze Abdomen: Soft/+BS, non tender, non distended, no guarding Extremities: No edema, No lymphangitis, No petechiae, No rashes, no synovitis   Data Reviewed: I have personally reviewed following labs and imaging studies Basic Metabolic Panel: Recent Labs  Lab 02/06/22 1656 02/07/22 0420  NA 132* 132*  K 4.2 3.8  CL 104 104  CO2 24 22  GLUCOSE 106* 90  BUN 19 17  CREATININE 0.85 0.78  CALCIUM 8.4* 8.2*  MG  --  2.0  PHOS  --  2.4*   Liver Function Tests: Recent Labs  Lab 02/06/22 1656 02/07/22 0420  AST 20 18  ALT 24 21  ALKPHOS 60 56  BILITOT 0.4 0.7  PROT 8.6* 7.6  ALBUMIN 2.6* 2.2*   No results for input(s): "LIPASE", "AMYLASE" in the last 168 hours. No results for input(s): "AMMONIA" in the last 168 hours. Coagulation Profile: No results for input(s): "INR", "PROTIME" in the last 168 hours. CBC: Recent Labs  Lab 02/06/22 1656 02/07/22 0420 02/08/22 0529  WBC 2.0* 1.7* 1.7*  HGB 7.1* 7.0* 8.0*  HCT 22.0* 21.3* 24.2*  MCV 105.3* 103.4* 101.3*  PLT 62* 59* 55*   Cardiac Enzymes: No results for input(s): "CKTOTAL", "CKMB", "CKMBINDEX", "TROPONINI" in the last 168 hours. BNP: Invalid input(s): "POCBNP" CBG: No results for input(s): "GLUCAP" in the last 168 hours. HbA1C: No results for input(s): "HGBA1C" in the last 72 hours. Urine analysis:    Component Value Date/Time   COLORURINE YELLOW 01/16/2022 1408   APPEARANCEUR CLEAR 01/16/2022 1408   APPEARANCEUR Clear 08/21/2021 1415   LABSPEC 1.017 01/16/2022 1408   PHURINE 5.0 01/16/2022 1408   GLUCOSEU NEGATIVE 01/16/2022 1408    HGBUR NEGATIVE 01/16/2022 1408   BILIRUBINUR NEGATIVE 01/16/2022 1408   BILIRUBINUR Negative 08/21/2021 1415   KETONESUR 5 (A) 01/16/2022 1408   PROTEINUR 30 (A) 01/16/2022 1408   UROBILINOGEN 1.0 02/02/2012 0630   NITRITE NEGATIVE 01/16/2022 1408   LEUKOCYTESUR NEGATIVE 01/16/2022 1408   Sepsis Labs: @LABRCNTIP (procalcitonin:4,lacticidven:4) )No results found for this or any previous visit (from the past 240 hour(s)).   Scheduled Meds:  acyclovir  400 mg Oral BID   aspirin EC  81 mg Oral Daily   atorvastatin  40 mg Oral QPM   feeding supplement  237 mL Oral BID BM   isosorbide mononitrate  30 mg Oral QHS   isosorbide mononitrate  90 mg Oral Daily   levothyroxine  75 mcg Oral Q0600   losartan  100 mg Oral Daily   pantoprazole  40 mg Oral QAC breakfast   Continuous Infusions:  Procedures/Studies: ECHOCARDIOGRAM COMPLETE  Result Date: 02/07/2022    ECHOCARDIOGRAM REPORT   Patient Name:   [name] Date of Exam: 02/07/2022 Medical Rec #:  [id number]      Height:       68.0 in Accession #:    [id number]     Weight:       120.6 lb Date of Birth:  Feb 24, 1929      BSA:          1.648 m Patient Age:    66 years       BP:           124/55 mmHg Patient Gender: M              HR:           72 bpm. Exam Location:  Inpatient Procedure: 3D Echo, 2D Echo, Cardiac Doppler and Color Doppler Indications:    Chest Pain R07.9  History:        Patient has prior history of Echocardiogram examinations, most                 recent 05/10/2021. CHF; Risk Factors:Hypertension and                 Dyslipidemia. GERD.  Sonographer:    Darlina Sicilian RDCS Referring Phys: [phone] Kamira Mellette IMPRESSIONS  1. Left ventricular ejection fraction, by estimation, is 65 to 70%. The left ventricle has normal function. The left ventricle has no regional wall motion abnormalities. Left ventricular diastolic parameters were normal.  2. Right ventricular systolic function is normal. The right ventricular size is normal.  3. The mitral  valve is normal in structure. Mild mitral valve regurgitation.  4. The aortic valve is tricuspid. Aortic valve regurgitation is not visualized.  5. The inferior vena cava is normal in size with greater than 50% respiratory variability, suggesting right atrial pressure of 3 mmHg. Comparison(s): The left ventricular function is unchanged. FINDINGS  Left Ventricle: Left ventricular ejection fraction, by estimation, is 65 to 70%. The left ventricle has normal function. The left ventricle has no regional wall motion abnormalities. The left ventricular internal cavity size was normal in size. There is  no left ventricular hypertrophy. Left ventricular diastolic parameters were normal. Right Ventricle: The right ventricular size is normal. Right vetricular wall thickness was not assessed. Right ventricular systolic function is normal. Left Atrium: Left atrial size was normal in size. Right Atrium: Right atrial size was normal in size. Pericardium: There is no evidence of pericardial effusion. Mitral Valve: The mitral valve is normal in structure. Mild mitral valve regurgitation. Tricuspid Valve: The tricuspid valve is normal in structure. Tricuspid valve regurgitation is mild. Aortic Valve: The aortic valve is tricuspid. Aortic valve regurgitation is not visualized. Pulmonic Valve: The pulmonic valve was normal in structure. Pulmonic valve regurgitation is not visualized. Aorta: The aortic root and ascending aorta are structurally normal, with no evidence of dilitation. Venous: The inferior vena cava is normal in size with greater than 50% respiratory variability, suggesting right atrial pressure of 3 mmHg. IAS/Shunts: No atrial level shunt detected by color flow Doppler.  LEFT VENTRICLE PLAX 2D LVIDd:         4.20 cm   Diastology LVIDs:         2.90 cm   LV e' medial:    7.29 cm/s LV PW:         1.10 cm   LV E/e' medial:  12.2 LV IVS:        1.10 cm   LV e' lateral:   8.92 cm/s LVOT diam:     2.10 cm   LV E/e' lateral:  10.0 LV SV:         73 LV SV Index:   45 LVOT Area:     3.46 cm                           3D Volume EF:                          3D EF:        73 %                          LV EDV:       126 ml                          LV ESV:       34 ml                          LV SV:        92 ml RIGHT VENTRICLE RV S prime:     16.50 cm/s TAPSE (M-mode): 1.4 cm LEFT ATRIUM             Index        RIGHT ATRIUM           Index LA diam:        3.60 cm 2.18 cm/m   RA Area:     11.80 cm LA Vol (A2C):   40.3 ml 24.45 ml/m  RA Volume:   23.40 ml  14.20 ml/m LA Vol (A4C):   34.9 ml 21.17 ml/m LA Biplane Vol: 41.6 ml 25.24 ml/m  AORTIC VALVE LVOT Vmax:   118.00 cm/s LVOT Vmean:  76.500 cm/s LVOT VTI:    0.212 m  AORTA Ao Root diam: 3.00 cm Ao Asc diam:  3.20 cm MITRAL VALVE MV Area (PHT): 4.04 cm    SHUNTS MV Decel Time: 188 msec    Systemic VTI:  0.21 m MV E velocity: 89.10 cm/s  Systemic Diam: 2.10 cm MV A velocity: 87.80 cm/s MV E/A ratio:  1.01 Dorris Carnes MD Electronically signed by Dorris Carnes MD Signature Date/Time: 02/07/2022/5:14:29 PM    Final    CT Angio Chest PE W and/or Wo Contrast  Result Date: 02/06/2022 CLINICAL DATA:  Left-sided chest pain since 2 p.m., history of coronary artery disease, positive D dimer EXAM: CT ANGIOGRAPHY CHEST WITH CONTRAST TECHNIQUE: Multidetector CT imaging of the chest was performed using the standard protocol during bolus administration of intravenous contrast. Multiplanar CT image reconstructions and MIPs were obtained to evaluate the vascular anatomy. RADIATION DOSE REDUCTION: This exam was performed according to the departmental dose-optimization program which includes automated exposure control, adjustment of the mA and/or kV according to patient size and/or use of iterative reconstruction technique. CONTRAST:  72m OMNIPAQUE IOHEXOL 350 MG/ML SOLN COMPARISON:  02/06/2022, 01/16/2022 FINDINGS: Cardiovascular: This is a technically adequate evaluation of the pulmonary vasculature. No  filling defects or pulmonary emboli. Dilated main pulmonary artery measuring 3.6 cm consistent with pulmonary arterial hypertension. The heart is unremarkable without pericardial effusion. Diffuse atherosclerosis throughout the coronary vasculature. No evidence of thoracic aortic aneurysm or dissection. Stable aortic atherosclerosis. Left chest wall port via internal jugular approach tip in the superior vena cava. Mediastinum/Nodes: Stable calcified mediastinal lymph nodes consistent with known history of sarcoidosis. Thyroid, trachea, and  esophagus are stable. Lungs/Pleura: Chronic areas of bilateral parenchymal scarring, most pronounced within the superior segment of the right lower lobe. No acute airspace disease, effusion, or pneumothorax. The central airways are patent. Upper Abdomen: No acute abnormality. Musculoskeletal: No acute or destructive bony lesions. Reconstructed images demonstrate no additional findings. Review of the MIP images confirms the above findings. IMPRESSION: 1. No evidence of pulmonary embolus. 2. Pulmonary scarring and calcified mediastinal lymph nodes consistent with known history of sarcoidosis. 3. Dilated main pulmonary artery consistent with pulmonary arterial hypertension. 4. Aortic Atherosclerosis (ICD10-I70.0). Coronary artery atherosclerosis. Electronically Signed   By: Randa Ngo M.D.   On: 02/06/2022 20:18   DG Chest Port 1 View  Result Date: 02/06/2022 CLINICAL DATA:  Left-sided chest pain. EXAM: PORTABLE CHEST 1 VIEW COMPARISON:  December 24, 2021 FINDINGS: The right-sided venous Port-A-Cath seen on the prior study has been removed and has been replaced with a left-sided venous Port-A-Cath. Its distal tip is seen at the junction of the superior vena cava and right atrium. The heart size and mediastinal contours are within normal limits. Mild, chronic appearing diffusely increased lung markings are seen. Mild linear atelectasis is noted within the mid to upper right  lung. There is no evidence of a pleural effusion or pneumothorax. Multilevel degenerative changes are seen throughout the thoracic spine. IMPRESSION: Mild, chronic appearing increased lung markings with mild linear atelectasis within the mid to upper right lung. Electronically Signed   By: Virgina Norfolk M.D.   On: 02/06/2022 16:55   CT CHEST ABDOMEN PELVIS W CONTRAST  Result Date: 01/16/2022 CLINICAL DATA:  Trauma, fall EXAM: CT CHEST, ABDOMEN, AND PELVIS WITH CONTRAST TECHNIQUE: Multidetector CT imaging of the chest, abdomen and pelvis was performed following the standard protocol during bolus administration of intravenous contrast. RADIATION DOSE REDUCTION: This exam was performed according to the departmental dose-optimization program which includes automated exposure control, adjustment of the mA and/or kV according to patient size and/or use of iterative reconstruction technique. CONTRAST:  42m OMNIPAQUE IOHEXOL 300 MG/ML  SOLN COMPARISON:  Previous studies including the CT abdomen done on 06/14/2021 FINDINGS: CT CHEST FINDINGS Cardiovascular: There is homogeneous enhancement in thoracic aorta. There are no intraluminal filling defects in central pulmonary artery branches. There is ectasia of the main pulmonary artery measuring 3.4 cm suggesting pulmonary arterial hypertension. Coronary artery calcifications are seen. Mediastinum/Nodes: There is no mediastinal hematoma. Left IJ chest port is noted with the tip of the catheter in superior vena cava. There are pockets of air in subcutaneous plane in the anterior chest, more so on the left side, possibly suggesting recent accessing of the chest port. There is a 2.4 cm loculated air containing structure in subcutaneous plane in the right anterior chest wall. Lungs/Pleura: Centrilobular and panlobular emphysema is seen. Increased interstitial markings are seen in the posterior segment of right upper lobe and in posterior right lower lobe. There is ectasia  bronchi in the area of increased interstitial markings. Findings may suggest scarring or interstitial pneumonia. There are linear densities in right middle lobe and lingula suggesting scarring or subsegmental atelectasis. Minimal left pleural effusion is seen. There is no pneumothorax. Musculoskeletal: No acute findings are seen in bony structures in the thorax. CT ABDOMEN PELVIS FINDINGS Hepatobiliary: There are few low-density lesions in liver largest measuring 3.4 cm in the left lobe, possibly cysts. There is no dilation of bile ducts. Gallbladder stones are seen. There is no wall thickening in gallbladder. Pancreas: No focal abnormalities are seen. Spleen: Unremarkable.  Adrenals/Urinary Tract: Adrenals are unremarkable. There is no hydronephrosis. There is 2 mm calculus in the right kidney. There is 1 mm calculus in the lower pole of left kidney. There is focal cortical thinning in the lateral aspect of left kidney, possibly related to previous intervention or pyelonephritis. There are no ureteral stones. Urinary bladder is unremarkable. Stomach/Bowel: Stomach is unremarkable. Small bowel loops are not dilated. Appendix is not dilated. There is no significant wall thickening in colon. There is no pericardial stranding. Diverticula are seen in the colon without signs of focal acute diverticulitis. Vascular/Lymphatic: Scattered calcifications are seen in aorta and its major branches. No significant lymphadenopathy is seen. Reproductive: Unremarkable. Other: There is no ascites or pneumoperitoneum. Right inguinal hernia containing fat is seen. Musculoskeletal: No recent fracture is seen in bony structures. IMPRESSION: No acute findings are seen in CT scan of chest, abdomen and pelvis. There is no focal pulmonary consolidation. There is no pleural effusion or pneumothorax. There is no evidence of mediastinal hematoma. Coronary artery disease. Ectasia of the main pulmonary artery suggests pulmonary arterial  hypertension. There is no laceration in the solid organs. There is no bowel wall thickening. There is no ascites or pneumoperitoneum. No displaced fractures are seen in bony structures. There is loculated pocket of air in the right anterior chest wall, most likely suggesting recent removal of right chest port. There are pockets of air adjacent to left IJ chest port suggesting recent placement of left chest port. COPD. There are scattered foci of increased interstitial markings, especially in posterior segment of right upper lobe and superior segment of right lower lobe suggesting scarring. Hepatic cysts. Small bilateral renal stones. Diverticulosis of colon. Other findings as described in the body of the report. Electronically Signed   By: Elmer Picker M.D.   On: 01/16/2022 17:30   CT Head Wo Contrast  Result Date: 01/16/2022 CLINICAL DATA:  Fall, neck trauma EXAM: CT HEAD WITHOUT CONTRAST CT CERVICAL SPINE WITHOUT CONTRAST TECHNIQUE: Multidetector CT imaging of the head and cervical spine was performed following the standard protocol without intravenous contrast. Multiplanar CT image reconstructions of the cervical spine were also generated. RADIATION DOSE REDUCTION: This exam was performed according to the departmental dose-optimization program which includes automated exposure control, adjustment of the mA and/or kV according to patient size and/or use of iterative reconstruction technique. COMPARISON:  CT head 01/04/2021 FINDINGS: CT HEAD FINDINGS Brain: Generalized atrophy without hydrocephalus. Patchy white matter hypodensity bilaterally unchanged. Negative for acute infarct, hemorrhage, mass Vascular: Negative for hyperdense vessel Skull: Negative calvarium Sinuses/Orbits: Expansile soft tissue mass in the left maxillary antrum is unchanged. This extends into the right nasal cavity. No aggressive bony destruction. Other: None CT CERVICAL SPINE FINDINGS Alignment: Normal Skull base and vertebrae:  Negative for fracture or mass Soft tissues and spinal canal: No soft tissue mass or adenopathy. Atherosclerotic calcification of the carotid bifurcation bilaterally left jugular vascular catheter with gas in the soft tissues likely due to recent placement. Disc levels: Mild disc degeneration. Moderate bilateral facet degeneration. Upper chest: Apical emphysema without acute abnormality Other: None IMPRESSION: 1. No acute intracranial injury. 2. Negative for cervical spine fracture 3. Expansile soft tissue mass in the left maxillary sinus extending into the left nasal cavity. This is unchanged from CT of 01/04/2021. 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 Electronically Signed   By: Franchot Gallo M.D.   On: 01/16/2022 17:17   CT Cervical Spine Wo Contrast  Result Date: 01/16/2022 CLINICAL DATA:  Fall, neck trauma EXAM: CT HEAD  WITHOUT CONTRAST CT CERVICAL SPINE WITHOUT CONTRAST TECHNIQUE: Multidetector CT imaging of the head and cervical spine was performed following the standard protocol without intravenous contrast. Multiplanar CT image reconstructions of the cervical spine were also generated. RADIATION DOSE REDUCTION: This exam was performed according to the departmental dose-optimization program which includes automated exposure control, adjustment of the mA and/or kV according to patient size and/or use of iterative reconstruction technique. COMPARISON:  CT head 01/04/2021 FINDINGS: CT HEAD FINDINGS Brain: Generalized atrophy without hydrocephalus. Patchy white matter hypodensity bilaterally unchanged. Negative for acute infarct, hemorrhage, mass Vascular: Negative for hyperdense vessel Skull: Negative calvarium Sinuses/Orbits: Expansile soft tissue mass in the left maxillary antrum is unchanged. This extends into the right nasal cavity. No aggressive bony destruction. Other: None CT CERVICAL SPINE FINDINGS Alignment: Normal Skull base and vertebrae: Negative for fracture or mass Soft tissues and spinal canal: No  soft tissue mass or adenopathy. Atherosclerotic calcification of the carotid bifurcation bilaterally left jugular vascular catheter with gas in the soft tissues likely due to recent placement. Disc levels: Mild disc degeneration. Moderate bilateral facet degeneration. Upper chest: Apical emphysema without acute abnormality Other: None IMPRESSION: 1. No acute intracranial injury. 2. Negative for cervical spine fracture 3. Expansile soft tissue mass in the left maxillary sinus extending into the left nasal cavity. This is unchanged from CT of 01/04/2021. 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 Electronically Signed   By: Franchot Gallo M.D.   On: 01/16/2022 17:17   IR IMAGING GUIDED PORT INSERTION  Result Date: 01/15/2022 INDICATION: Port malfunction on recently placed port, 12/18/2021. Hub detachment. EXAM: Procedures: 1. IMPLANTED PORT A CATH PLACEMENT WITH ULTRASOUND AND FLUOROSCOPIC GUIDANCE 2. REMOVAL OF IMPLANTED TUNNELED PORT-A-CATH MEDICATIONS: Ancef 2 gm IV; The antibiotic was administered within an appropriate time interval prior to skin puncture. ANESTHESIA/SEDATION: Moderate (conscious) sedation was employed during this procedure. A total of Versed 2 mg and Fentanyl 100 mcg was administered intravenously. Moderate Sedation Time: 58 minutes. The patient's level of consciousness and vital signs were monitored continuously by radiology nursing throughout the procedure under my direct supervision. FLUOROSCOPY TIME:  Fluoroscopic dose; 2 mGy COMPLICATIONS: None immediate. PROCEDURE: The procedure, risks, benefits, and alternatives were explained to the patient and/or patient's representative . Questions regarding the procedure were encouraged and answered. The patient and/or patient's representative understands and consents to the procedure. PORT PLACEMENT; The LEFT neck and chest was prepped with chlorhexidine in a sterile fashion, and a sterile drape was applied covering the operative field. Maximum barrier sterile  technique with sterile gowns and gloves were used for the procedure. A timeout was performed prior to the initiation of the procedure. Local anesthesia was provided with 1% lidocaine with epinephrine. After creating a small venotomy incision, a micropuncture kit was utilized to access the internal jugular vein under direct, real-time ultrasound guidance. Ultrasound image documentation was performed. The microwire was kinked to measure appropriate catheter length. A subcutaneous port pocket was then created along the upper chest wall utilizing a combination of sharp and blunt dissection. The pocket was irrigated with sterile saline. A single lumen Non-ISP power injectable port was chosen for placement. The 8 Fr catheter was tunneled from the port pocket site to the venotomy incision. The port was placed in the pocket. The external catheter was trimmed to appropriate length. At the venotomy, an 8 Fr peel-away sheath was placed over a guidewire under fluoroscopic guidance. The catheter was then placed through the sheath and the sheath was removed. Final catheter positioning was confirmed and documented  with a fluoroscopic spot radiograph. The port was accessed with a Huber needle, aspirated and flushed with heparinized saline. The port pocket incision was closed with interrupted 3-0 Vicryl suture then Dermabond was applied, including at the venotomy incision. Dressings were placed. PORT REMOVAL; Attention was then directed to the RIGHT chest and previously placed Port-A-Cath. The site had been prepped with chlorhexidine. Local anesthesia was provided with 1% lidocaine with epinephrine. A timeout was performed prior to the initiation of the procedure. An incision was made overlying the Port-A-Cath with a scalpel. Utilizing sharp and blunt dissection, the Port-A-Cath was removed completely. The pocked was irrigated with sterile saline. Wound closure was performed with interrupted subcutaneous 2-0 Vicryl sutures then  Dermabond was applied at the skin. Dressings were applied. The patient tolerated the procedures well without immediate post procedural complication. IMPRESSION: 1. Successful placement of a LEFT chest internal jugular approach power injectable Port-A-Cath. The tip of the catheter is positioned within the superior cavoatrial junction. The catheter is ready for immediate use. 2. Successful removal of implanted RIGHT chest Port-A-Cath without immediate post procedural complication. Michaelle Birks, MD [facility] Radiology Electronically Signed   By: Michaelle Birks M.D.   On: 01/15/2022 23:15   IR REMOVAL TUN ACCESS W/ PORT W/O FL MOD SED  Result Date: 01/15/2022 INDICATION: Port malfunction on recently placed port, 12/18/2021. Hub detachment. EXAM: Procedures: 1. IMPLANTED PORT A CATH PLACEMENT WITH ULTRASOUND AND FLUOROSCOPIC GUIDANCE 2. REMOVAL OF IMPLANTED TUNNELED PORT-A-CATH MEDICATIONS: Ancef 2 gm IV; The antibiotic was administered within an appropriate time interval prior to skin puncture. ANESTHESIA/SEDATION: Moderate (conscious) sedation was employed during this procedure. A total of Versed 2 mg and Fentanyl 100 mcg was administered intravenously. Moderate Sedation Time: 58 minutes. The patient's level of consciousness and vital signs were monitored continuously by radiology nursing throughout the procedure under my direct supervision. FLUOROSCOPY TIME:  Fluoroscopic dose; 2 mGy COMPLICATIONS: None immediate. PROCEDURE: The procedure, risks, benefits, and alternatives were explained to the patient and/or patient's representative . Questions regarding the procedure were encouraged and answered. The patient and/or patient's representative understands and consents to the procedure. PORT PLACEMENT; The LEFT neck and chest was prepped with chlorhexidine in a sterile fashion, and a sterile drape was applied covering the operative field. Maximum barrier sterile  technique with sterile gowns and gloves were used for the procedure. A timeout was performed prior to the initiation of the procedure. Local anesthesia was provided with 1% lidocaine with epinephrine. After creating a small venotomy incision, a micropuncture kit was utilized to access the internal jugular vein under direct, real-time ultrasound guidance. Ultrasound image documentation was performed. The microwire was kinked to measure appropriate catheter length. A subcutaneous port pocket was then created along the upper chest wall utilizing a combination of sharp and blunt dissection. The pocket was irrigated with sterile saline. A single lumen Non-ISP power injectable port was chosen for placement. The 8 Fr catheter was tunneled from the port pocket site to the venotomy incision. The port was placed in the pocket. The external catheter was trimmed to appropriate length. At the venotomy, an 8 Fr peel-away sheath was placed over a guidewire under fluoroscopic guidance. The catheter was then placed through the sheath and the sheath was removed. Final catheter positioning was confirmed and documented with a fluoroscopic spot radiograph. The port was accessed with a Huber needle, aspirated and flushed with heparinized saline. The port pocket incision was closed with interrupted 3-0 Vicryl suture then Dermabond  was applied, including at the venotomy incision. Dressings were placed. PORT REMOVAL; Attention was then directed to the RIGHT chest and previously placed Port-A-Cath. The site had been prepped with chlorhexidine. Local anesthesia was provided with 1% lidocaine with epinephrine. A timeout was performed prior to the initiation of the procedure. An incision was made overlying the Port-A-Cath with a scalpel. Utilizing sharp and blunt dissection, the Port-A-Cath was removed completely. The pocked was irrigated with sterile saline. Wound closure was performed with interrupted subcutaneous 2-0 Vicryl sutures then  Dermabond was applied at the skin. Dressings were applied. The patient tolerated the procedures well without immediate post procedural complication. IMPRESSION: 1. Successful placement of a LEFT chest internal jugular approach power injectable Port-A-Cath. The tip of the catheter is positioned within the superior cavoatrial junction. The catheter is ready for immediate use. 2. Successful removal of implanted RIGHT chest Port-A-Cath without immediate post procedural complication. Michaelle Birks, MD [facility] Radiology Electronically Signed   By: Michaelle Birks M.D.   On: 01/15/2022 23:15    Orson Eva, DO  [facility]  If 7PM-7AM, please contact night-coverage [email or web address] Password [email or web address] 02/08/2022, 8:04 AM   LOS: 0 days

## 2022-02-08 NOTE — Progress Notes (Signed)
Patient remains alert and oriented. He is able to ask for assistance to use the bathroom. He is an assist with standing and shifting to the bedside commode.

## 2022-02-08 NOTE — Progress Notes (Signed)
Wife Mrs. Denton Brick called and informed that

## 2022-02-11 ENCOUNTER — Encounter: Payer: Self-pay | Admitting: *Deleted

## 2022-02-11 LAB — TYPE AND SCREEN
ABO/RH(D): O POS
Antibody Screen: POSITIVE
DAT, IgG: NEGATIVE
Unit division: 0
Unit division: 0

## 2022-02-11 LAB — BPAM RBC
Blood Product Expiration Date: 202311022359
Blood Product Expiration Date: 202311022359
ISSUE DATE / TIME: 202310051658
ISSUE DATE / TIME: 202310051658
Unit Type and Rh: 5100
Unit Type and Rh: 5100

## 2022-02-11 NOTE — Progress Notes (Signed)
Oncology Discharge Planning Note  [place] at [facility] Address: [address], [place] [address] Hours of Operation:  8am - 5pm, Monday - Friday  Clinic Contact Information:  [phone]  Oncology Care Team: Medical Oncologist:  Derek Jack  Patient Details: Name:  Dale [name], Dale [name] MRN:   [id number] DOB:   Sep 10, 1928 Reason for Current Admission: '@PPROB'$ @  Discharge Planning Narrative: Discharge follow-up appointments for oncology are current and available on the AVS and MyChart.   Upon discharge from the hospital, hematology/oncology's post discharge plan of care for the outpatient setting is: 02/14/22, as previously scheduled.   Outpatient Oncology Specific Care Only: Oncology appointment transportation needs addressed?:  not applicable Oncology medication management for symptom management addressed?:  not applicable Chemo Alert Card reviewed?:  not applicable Immunotherapy Alert Card reviewed?:  not applicable

## 2022-02-12 ENCOUNTER — Other Ambulatory Visit: Payer: Self-pay | Admitting: Nurse Practitioner

## 2022-02-12 DIAGNOSIS — D509 Iron deficiency anemia, unspecified: Secondary | ICD-10-CM | POA: Diagnosis not present

## 2022-02-12 DIAGNOSIS — T451X5D Adverse effect of antineoplastic and immunosuppressive drugs, subsequent encounter: Secondary | ICD-10-CM | POA: Diagnosis not present

## 2022-02-12 DIAGNOSIS — I509 Heart failure, unspecified: Secondary | ICD-10-CM | POA: Diagnosis not present

## 2022-02-12 DIAGNOSIS — D6481 Anemia due to antineoplastic chemotherapy: Secondary | ICD-10-CM | POA: Diagnosis not present

## 2022-02-12 DIAGNOSIS — I110 Hypertensive heart disease with heart failure: Secondary | ICD-10-CM | POA: Diagnosis not present

## 2022-02-12 DIAGNOSIS — Z515 Encounter for palliative care: Secondary | ICD-10-CM

## 2022-02-12 DIAGNOSIS — R630 Anorexia: Secondary | ICD-10-CM

## 2022-02-12 DIAGNOSIS — C9000 Multiple myeloma not having achieved remission: Secondary | ICD-10-CM | POA: Diagnosis not present

## 2022-02-13 ENCOUNTER — Encounter: Payer: Self-pay | Admitting: Nurse Practitioner

## 2022-02-13 NOTE — Progress Notes (Signed)
[place] Consult Note Telephone: [phone]  Fax: [phone]   Date of encounter: 02/13/22 9:29 AM PATIENT NAME: Dale [name] [address] [place] [address]   [phone] (home)  DOB: 13-Mar-1929 MRN: [id number] PRIMARY CARE PROVIDER:    Redmond School, MD,  [address] [place] [address] [phone]  REFERRING PROVIDER:   Redmond School, [place],  [place] [address] [phone]  RESPONSIBLE PARTY:    Contact Information     Name Relation Home Work [place] Son [phone]  [phone]   Dale [name], Surgeon" Spouse [phone]         Due to the COVID-19 crisis, this visit was done via telemedicine from my office and it was initiated and consent by this patient and or family.  I connected with  Dale [name] with Dale [name] OR PROXY on 02/13/22 by telephone as video unavailable enabled telemedicine application and verified that I am speaking with the correct person using two identifiers.   I discussed the limitations of evaluation and management by telemedicine. The patient expressed understanding and agreed to proceed.  Palliative Care was asked to follow this patient by consultation request of  Redmond School, MD to address advance care planning and complex medical decision making. This is the initial visit.             ASSESSMENT AND PLAN / RECOMMENDATIONS:  Symptom Management/Plan: 1. Advance Care Planning;  Ongoing discussions to continue with PC RN/PC SW as PC NP signing off due to program change, currently Dale [name] is stable.   2. Goals of Care: Goals include to maximize quality of life and symptom management. Our advance care planning conversation included a discussion about:    The value and importance of advance care planning  Exploration of personal, cultural or spiritual beliefs that might influence medical decisions  Exploration of goals of care  in the event of a sudden injury or illness  Identification and preparation of a healthcare agent  Review and updating or creation of an advance directive document.  3. Anorexia secondary to multiple myeloma with decompensation due to recent hospitalization ongoing, discussed nutrition, meals, supplements, education completed.  Oncology History  Multiple myeloma ([place])  01/15/2021 Initial Diagnosis    Multiple myeloma ([place])    06/25/2021 - 12/06/2021 Chemotherapy    Patient is on Treatment Plan : MYELOMA NON-TRANSPLANT CANDIDATES VRd weekly q21d     12/20/2021 -  Chemotherapy    Patient is on Treatment Plan : MYELOMA Daratumumab IV q28d       I spent 35 minutes providing this consultation. More than 50% of the time in this consultation was spent in counseling and care coordination. PPS: 50%  Chief Complaint: Initial palliative consult for complex medical decision making, address goals, manage ongoing symptoms  HISTORY OF PRESENT ILLNESS:  Dale [name] is a 87 y.o. year old male  with multiple medical problems including myeloma, ITP, chronic anemia, coronary artery disease status post stent 2013, hypertension, BPH, left-sided renal cell carcinoma status post recurrent cryoablation, hyperlipidemia, hypothyroidism. Hospitalized 02/06/2022 to 02/08/2022 for chest pain atypical, cardiology consulted, echo EF 65-70%, cxr right lower lobe scarring; CTA negative for PE. Multiple myeloma with last treatment with  Darzalex and Velcade on 01/31/2021, Oncologist Dr. Delton Coombes with baseline hgb 7 -8 requiring 1 unit to be transfused. Stabilized and d/c home where he lives with his wife. I called Dale and Dale [name] for initial pc  consult. We talked about purpose of consult, past medical history, ros, symptoms, denies pain or sob, sleeping well. Appetite remains decreased. Discussed nutrition, supplements, weights. We talked about functional abilities. Dale. [name] is very hoh. We talked about past medical history,  social and family history briefly. We talked about medical goals with wishes for ongoing treatments for multiple myeloma. We talked about f/u pc visit with pc rn/pc sw with pc np signing off due to program change. Will refer back to Primary/Oncology for symptom management should arise, currently stable. Therapeutic listening emotional support provided. Questions answered. Contact information provided.   History obtained from review of EMR, discussion with Dale and Dale. [name].  I reviewed available labs, medications, imaging, studies and related documents from the EMR.  Records reviewed and summarized above.   ROS 10 point system reviewed all negative except hpi  Physical Exam: deferred CURRENT PROBLEM LIST:  Patient Active Problem List   Diagnosis Date Noted   Elevated d-dimer 02/07/2022   Non-cardiac chest pain    Long term (current) use of aspirin    Chest pain 02/06/2022   Syncope 01/16/2022   Anemia associated with chemotherapy 01/16/2022   Laceration of skin of right hand 01/16/2022   Laceration of skin of right forearm 01/16/2022   Laceration of skin of left forearm 01/16/2022   Protein-calorie malnutrition, severe ([place]) 07/29/2021   Hyponatremia 07/29/2021   Pancytopenia ([place]) 02/58/5277   Acute metabolic encephalopathy 82/42/3536   CAP (community acquired pneumonia) 07/27/2021   Multiple myeloma ([place]) 01/15/2021   Plasma cell dyscrasia 12/07/2020   Anemia of chronic disease 05/21/2017   Weight loss of more than 10% body weight 01/27/2017   Sinusitis 01/02/2017   Left renal mass 05/17/2016   Hiatal hernia    Schatzki's ring    Odynophagia 03/03/2015   Easy bruisability 09/26/2014   Mixed hyperlipidemia 06/28/2014   First degree AV block 10/08/2013   Atypical chest pain 02/02/2012   CAD, RCA stent, RCA new DES July 2009, Cath OK 2011, 02/02/12 02/02/2012   Unstable angina, cath showed patent stents 02/02/12 02/02/2012   Thrombocytopenia ([place]) 02/02/2012   BPH (benign  prostatic hyperplasia) 02/02/2012   Chest pain 08/15/2011   Macular degeneration 08/15/2011   History of ITP 08/15/2011   Abdominal pain 06/28/2010   DIARRHEA 06/21/2010   HEMATOCHEZIA 01/19/2009   DYSPHAGIA UNSPECIFIED 01/19/2009   Acquired hypothyroidism 01/18/2009   HYPERCHOLESTEROLEMIA 01/18/2009   Unspecified glaucoma 01/18/2009   Essential hypertension 01/18/2009   CHF 01/18/2009   SCHATZKI'S RING 01/18/2009   ANAL FISSURE 01/18/2009   NEPHROLITHIASIS 01/18/2009   PUD, HX OF 01/18/2009   Sarcoidosis 08/27/2007   Coronary atherosclerosis 08/27/2007   Seasonal and perennial allergic rhinitis 08/27/2007   Allergic asthma, mild intermittent, uncomplicated 14/43/1540   PAST MEDICAL HISTORY:  Active Ambulatory Problems    Diagnosis Date Noted   Sarcoidosis 08/27/2007   Acquired hypothyroidism 01/18/2009   HYPERCHOLESTEROLEMIA 01/18/2009   Unspecified glaucoma 01/18/2009   Essential hypertension 01/18/2009   Coronary atherosclerosis 08/27/2007   CHF 01/18/2009   Seasonal and perennial allergic rhinitis 08/27/2007   Allergic asthma, mild intermittent, uncomplicated 08/67/6195   SCHATZKI'S RING 01/18/2009   ANAL FISSURE 01/18/2009   HEMATOCHEZIA 01/19/2009   NEPHROLITHIASIS 01/18/2009   DYSPHAGIA UNSPECIFIED 01/19/2009   PUD, HX OF 01/18/2009   DIARRHEA 06/21/2010   Abdominal pain 06/28/2010   Chest pain 08/15/2011   Macular degeneration 08/15/2011   History of ITP 08/15/2011   Atypical chest pain 02/02/2012   CAD, RCA stent,  RCA new DES July 2009, Cath OK 2011, 02/02/12 02/02/2012   Unstable angina, cath showed patent stents 02/02/12 02/02/2012   Thrombocytopenia ([place]) 02/02/2012   BPH (benign prostatic hyperplasia) 02/02/2012   First degree AV block 10/08/2013   Mixed hyperlipidemia 06/28/2014   Easy bruisability 09/26/2014   Odynophagia 03/03/2015   Hiatal hernia    Schatzki's ring    Left renal mass 05/17/2016   Sinusitis 01/02/2017   Weight loss of more than  10% body weight 01/27/2017   Anemia of chronic disease 05/21/2017   Plasma cell dyscrasia 12/07/2020   Multiple myeloma ([place]) 01/15/2021   CAP (community acquired pneumonia) 07/27/2021   Protein-calorie malnutrition, severe ([place]) 07/29/2021   Hyponatremia 07/29/2021   Pancytopenia ([place]) 99/35/7017   Acute metabolic encephalopathy 79/39/0300   Syncope 01/16/2022   Anemia associated with chemotherapy 01/16/2022   Laceration of skin of right hand 01/16/2022   Laceration of skin of right forearm 01/16/2022   Laceration of skin of left forearm 01/16/2022   Chest pain 02/06/2022   Elevated d-dimer 02/07/2022   Non-cardiac chest pain    Long term (current) use of aspirin    Resolved Ambulatory Problems    Diagnosis Date Noted   H/O sarcoidosis 08/15/2011   Pneumonia 07/28/2021   Past Medical History:  Diagnosis Date   Allergic rhinitis    Anginal pain (HCC)    Asthma    Cancer (HCC)    CHF (congestive heart failure) (HCC)    Coronary heart disease    Dyspnea    Dysrhythmia    GERD (gastroesophageal reflux disease)    Glaucoma    Hyperlipidemia    Hypertension    Hypothyroidism    Idiopathic thrombocytopenic purpura ([place]) 2002   Nephrolithiasis    PUD (peptic ulcer disease)    SOCIAL HX:  Social History   Tobacco Use   Smoking status: Former    Packs/day: 0.10    Years: 2.00    Total pack years: 0.20    Types: Cigarettes, Cigars    Quit date: 05/06/1970    Years since quitting: 51.8   Smokeless tobacco: Never  Substance Use Topics   Alcohol use: No    Alcohol/week: 0.0 standard drinks of alcohol   FAMILY HX:  Family History  Problem Relation Age of Onset   Heart disease Father        deceased age 32   Stroke Mother    Alzheimer's disease Mother    Heart attack Brother        deceased age 72   Cancer Other        niece   Colon cancer Neg Hx       ALLERGIES:  Allergies  Allergen Reactions   Azithromycin Other (See Comments)    Sore mouth and fever  blisters around mouth, sores in nose area as well   Doxazosin Shortness Of Breath   Atenolol Other (See Comments)    UNKNOWN REACTION   Hydrocodone Nausea And Vomiting   Levofloxacin Other (See Comments)    Caused stomach problems.   Morphine Other (See Comments)    "made me crazy"   Penicillins Nausea And Vomiting and Other (See Comments)    Has patient had a PCN reaction causing immediate rash, facial/tongue/throat swelling, SOB or lightheadedness with hypotension: No Has patient had a PCN reaction causing severe rash involving mucus membranes or skin necrosis: No Has patient had a PCN reaction that required hospitalization No Has patient had a PCN reaction occurring within  the last 10 years: No If all of the above answers are "NO", then may proceed with Cephalosporin use.    Sulfonamide Derivatives Nausea And Vomiting     PERTINENT MEDICATIONS:  Outpatient Encounter Medications as of 02/12/2022  Medication Sig   acetaminophen (TYLENOL) 325 MG tablet Take 2 tablets (650 mg total) by mouth every 6 (six) hours as needed for mild pain (or Fever >/= 101).   acyclovir (ZOVIRAX) 400 MG tablet TAKE 1 TABLET BY MOUTH TWICE  DAILY   Ascorbic Acid (VITAMIN C PO) Take 500 mg by mouth every evening.   aspirin EC 81 MG tablet Take 1 tablet (81 mg total) by mouth daily. Swallow whole.   atorvastatin (LIPITOR) 40 MG tablet TAKE 1 TABLET BY MOUTH IN  THE EVENING (Patient taking differently: Take 40 mg by mouth daily.)   Bortezomib (VELCADE IJ) Inject as directed once a week.   brimonidine (ALPHAGAN) 0.2 % ophthalmic solution Place 1 drop into the left eye 2 (two) times daily.   cyanocobalamin 1000 MCG tablet Take 1 tablet (1,000 mcg total) by mouth daily.   dexamethasone (DECADRON) 2 MG tablet TAKE 5 TABLETS BY MOUTH ONCE  WEEKLY   dorzolamide-timolol (COSOPT) 22.3-6.8 MG/ML ophthalmic solution Place 1 drop into the left eye 2 (two) times daily.   feeding supplement (ENSURE ENLIVE / ENSURE PLUS)  LIQD Take 237 mLs by mouth 3 (three) times daily between meals.   furosemide (LASIX) 20 MG tablet If you notice any swelling, you may take 40 mg (two tablets) for the day. (Patient taking differently: Take 20 mg by mouth 2 (two) times daily.)   isosorbide mononitrate (IMDUR) 60 MG 24 hr tablet Take 1 tablet (60 mg total) by mouth daily. TAKE 1 AND 1/2 TABLETS BY  MOUTH IN THE MORNING AND  1/2 TABLET AT NIGHT   levothyroxine (SYNTHROID) 75 MCG tablet Take 75 mcg by mouth daily before breakfast.   lidocaine-prilocaine (EMLA) cream Apply 1 Application topically as needed.   losartan (COZAAR) 100 MG tablet TAKE 1 TABLET BY MOUTH  DAILY (Patient taking differently: Take 100 mg by mouth daily.)   metoprolol tartrate (LOPRESSOR) 25 MG tablet TAKE 1 TABLET BY MOUTH IN  THE MORNING AND ONE-HALF  TABLET BY MOUTH IN THE  EVENING (Patient taking differently: Take 12.5-25 mg by mouth in the morning and at bedtime. TAKE 1 TABLET BY MOUTH IN  THE MORNING AND ONE-HALF  TABLET BY MOUTH IN THE  EVENING)   Multiple Vitamins-Minerals (PRESERVISION/LUTEIN) CAPS Take 1 capsule by mouth 2 (two) times daily.   nitroGLYCERIN (NITROSTAT) 0.4 MG SL tablet Place 1 tablet (0.4 mg total) under the tongue every 5 (five) minutes as needed. For chest pain   pantoprazole (PROTONIX) 40 MG tablet TAKE 1 TABLET BY MOUTH DAILY  BEFORE BREAKFAST   potassium chloride SA (KLOR-CON M) 20 MEQ tablet TAKE 1 TABLET BY MOUTH  DAILY (Patient taking differently: Take 20 mEq by mouth daily.)   prochlorperazine (COMPAZINE) 10 MG tablet Take 1 tablet (10 mg total) by mouth every 6 (six) hours as needed for nausea or vomiting.   ROCKLATAN 0.02-0.005 % SOLN Place 1 drop into the left eye at bedtime.   triamcinolone (KENALOG) 0.1 % cream Apply 1 application. topically 2 (two) times daily as needed (for irritation).   vitamin E 200 UNIT capsule Take 200 Units by mouth every evening.   Facility-Administered Encounter Medications as of 02/12/2022   Medication   acetaminophen (TYLENOL) 325 MG tablet   diphenhydrAMINE (  BENADRYL) 25 mg capsule   montelukast (SINGULAIR) 10 MG tablet   Thank you for the opportunity to participate in the care of Dale. Raptis.  The palliative care team will continue to follow. Please call our office at [phone] if we can be of additional assistance.   Seira Cody Z Khayree Delellis, NP ,

## 2022-02-14 ENCOUNTER — Inpatient Hospital Stay: Payer: Medicare Other

## 2022-02-14 ENCOUNTER — Inpatient Hospital Stay (HOSPITAL_BASED_OUTPATIENT_CLINIC_OR_DEPARTMENT_OTHER): Payer: Medicare Other | Admitting: Hematology

## 2022-02-14 ENCOUNTER — Other Ambulatory Visit: Payer: Medicare Other

## 2022-02-14 ENCOUNTER — Ambulatory Visit: Payer: Medicare Other | Admitting: Hematology

## 2022-02-14 ENCOUNTER — Ambulatory Visit: Payer: Medicare Other

## 2022-02-14 ENCOUNTER — Inpatient Hospital Stay: Payer: Medicare Other | Attending: Hematology

## 2022-02-14 VITALS — BP 145/68 | HR 81 | Temp 97.9°F | Resp 18

## 2022-02-14 DIAGNOSIS — D6481 Anemia due to antineoplastic chemotherapy: Secondary | ICD-10-CM | POA: Diagnosis not present

## 2022-02-14 DIAGNOSIS — Z79899 Other long term (current) drug therapy: Secondary | ICD-10-CM | POA: Diagnosis not present

## 2022-02-14 DIAGNOSIS — C9000 Multiple myeloma not having achieved remission: Secondary | ICD-10-CM

## 2022-02-14 DIAGNOSIS — Z5112 Encounter for antineoplastic immunotherapy: Secondary | ICD-10-CM | POA: Diagnosis not present

## 2022-02-14 LAB — COMPREHENSIVE METABOLIC PANEL
ALT: 22 U/L (ref 0–44)
AST: 19 U/L (ref 15–41)
Albumin: 2.5 g/dL — ABNORMAL LOW (ref 3.5–5.0)
Alkaline Phosphatase: 60 U/L (ref 38–126)
Anion gap: 3 — ABNORMAL LOW (ref 5–15)
BUN: 18 mg/dL (ref 8–23)
CO2: 23 mmol/L (ref 22–32)
Calcium: 8.6 mg/dL — ABNORMAL LOW (ref 8.9–10.3)
Chloride: 107 mmol/L (ref 98–111)
Creatinine, Ser: 0.72 mg/dL (ref 0.61–1.24)
GFR, Estimated: >60 mL/min (ref >60)
Glucose, Bld: 111 mg/dL — ABNORMAL HIGH (ref 70–99)
Potassium: 4.1 mmol/L (ref 3.5–5.1)
Sodium: 133 mmol/L — ABNORMAL LOW (ref 135–145)
Total Bilirubin: 0.6 mg/dL (ref 0.3–1.2)
Total Protein: 8.5 g/dL — ABNORMAL HIGH (ref 6.5–8.1)

## 2022-02-14 LAB — MAGNESIUM: Magnesium: 2.2 mg/dL (ref 1.7–2.4)

## 2022-02-14 LAB — PREPARE RBC (CROSSMATCH)

## 2022-02-14 MED ORDER — DIPHENHYDRAMINE HCL 25 MG PO CAPS
50.0000 mg | ORAL_CAPSULE | Freq: Once | ORAL | Status: AC
  Administered 2022-02-14: 50 mg via ORAL
  Filled 2022-02-14: qty 2

## 2022-02-14 MED ORDER — ONDANSETRON HCL 4 MG/2ML IJ SOLN
8.0000 mg | Freq: Once | INTRAMUSCULAR | Status: AC
  Administered 2022-02-14: 8 mg via INTRAVENOUS
  Filled 2022-02-14: qty 4

## 2022-02-14 MED ORDER — SODIUM CHLORIDE 0.9 % IV SOLN
16.0000 mg/kg | Freq: Once | INTRAVENOUS | Status: AC
  Administered 2022-02-14: 900 mg via INTRAVENOUS
  Filled 2022-02-14: qty 40

## 2022-02-14 MED ORDER — ACETAMINOPHEN 325 MG PO TABS
650.0000 mg | ORAL_TABLET | Freq: Once | ORAL | Status: AC
  Administered 2022-02-14: 650 mg via ORAL
  Filled 2022-02-14: qty 2

## 2022-02-14 MED ORDER — BORTEZOMIB CHEMO SQ INJECTION 3.5 MG (2.5MG/ML)
1.0000 mg/m2 | Freq: Once | INTRAMUSCULAR | Status: AC
  Administered 2022-02-14: 1.75 mg via SUBCUTANEOUS
  Filled 2022-02-14: qty 0.7

## 2022-02-14 MED ORDER — HEPARIN SOD (PORK) LOCK FLUSH 100 UNIT/ML IV SOLN
500.0000 [IU] | Freq: Once | INTRAVENOUS | Status: AC | PRN
  Administered 2022-02-14: 500 [IU]

## 2022-02-14 MED ORDER — METHYLPREDNISOLONE SODIUM SUCC 125 MG IJ SOLR
100.0000 mg | Freq: Once | INTRAMUSCULAR | Status: AC
  Administered 2022-02-14: 100 mg via INTRAVENOUS
  Filled 2022-02-14: qty 2

## 2022-02-14 MED ORDER — SODIUM CHLORIDE 0.9 % IV SOLN: Freq: Once | INTRAVENOUS | Status: AC

## 2022-02-14 MED ORDER — SODIUM CHLORIDE 0.9% FLUSH
10.0000 mL | INTRAVENOUS | Status: DC | PRN
  Administered 2022-02-14 (×2): 10 mL

## 2022-02-14 NOTE — Patient Instructions (Signed)
[place] at [facility] Discharge Instructions   You were seen and examined today by Dr. Delton Coombes.  He reviewed the results of your lab work today which is normal/stable.   We will proceed with your treatment today.   He discussed with you changing treatment to every other week.   We will give you a unit of blood tomorrow.   Return as scheduled.    Thank you for choosing [place] at [facility] to provide your oncology and hematology care.  To afford each patient quality time with our provider, please arrive at least 15 minutes before your scheduled appointment time.   If you have a lab appointment with the [facility] please come in thru the Main Entrance and check in at the main information desk.  You need to re-schedule your appointment should you arrive 10 or more minutes late.  We strive to give you quality time with our providers, and arriving late affects you and other patients whose appointments are after yours.  Also, if you no show three or more times for appointments you may be dismissed from the clinic at the providers discretion.     Again, thank you for choosing [facility].  Our hope is that these requests will decrease the amount of time that you wait before being seen by our physicians.       _____________________________________________________________  Should you have questions after your visit to [facility], please contact our office at [phone] and follow the prompts.  Our office hours are 8:00 a.m. and 4:30 p.m. Monday - Friday.  Please note that voicemails left after 4:00 p.m. may not be returned until the following business day.  We are closed weekends and major holidays.  You do have access to a nurse 24-7, just call the main number to the clinic [phone] and do not press any options, hold on the line and a nurse will answer the phone.    For prescription refill requests,  have your pharmacy contact our office and allow 72 hours.    Due to Covid, you will need to wear a mask upon entering the hospital. If you do not have a mask, a mask will be given to you at the Main Entrance upon arrival. For doctor visits, patients may have 1 support person age 22 or older with them. For treatment visits, patients can not have anyone with them due to social distancing guidelines and our immunocompromised population.

## 2022-02-14 NOTE — Patient Instructions (Signed)
[facility]  Discharge Instructions: Thank you for choosing [facility] to provide your oncology and hematology care.  If you have a lab appointment with the [facility], please come in thru the Main Entrance and check in at the main information desk.  Wear comfortable clothing and clothing appropriate for easy access to any Portacath or PICC line.   We strive to give you quality time with your provider. You may need to reschedule your appointment if you arrive late (15 or more minutes).  Arriving late affects you and other patients whose appointments are after yours.  Also, if you miss three or more appointments without notifying the office, you may be dismissed from the clinic at the provider's discretion.      For prescription refill requests, have your pharmacy contact our office and allow 72 hours for refills to be completed.    Today you received the following chemotherapy and/or immunotherapy agents IV Daratumumab and Velcade, return as scheduled.    To help prevent nausea and vomiting after your treatment, we encourage you to take your nausea medication as directed.  BELOW ARE SYMPTOMS THAT SHOULD BE REPORTED IMMEDIATELY: *FEVER GREATER THAN 100.4 F (38 C) OR HIGHER *CHILLS OR SWEATING *NAUSEA AND VOMITING THAT IS NOT CONTROLLED WITH YOUR NAUSEA MEDICATION *UNUSUAL SHORTNESS OF BREATH *UNUSUAL BRUISING OR BLEEDING *URINARY PROBLEMS (pain or burning when urinating, or frequent urination) *BOWEL PROBLEMS (unusual diarrhea, constipation, pain near the anus) TENDERNESS IN MOUTH AND THROAT WITH OR WITHOUT PRESENCE OF ULCERS (sore throat, sores in mouth, or a toothache) UNUSUAL RASH, SWELLING OR PAIN  UNUSUAL VAGINAL DISCHARGE OR ITCHING   Items with * indicate a potential emergency and should be followed up as soon as possible or go to the Emergency Department if any problems should occur.  Please show the CHEMOTHERAPY ALERT CARD or IMMUNOTHERAPY  ALERT CARD at check-in to the Emergency Department and triage nurse.  Should you have questions after your visit or need to cancel or reschedule your appointment, please contact [facility] [phone]  and follow the prompts.  Office hours are 8:00 a.m. to 4:30 p.m. Monday - Friday. Please note that voicemails left after 4:00 p.m. may not be returned until the following business day.  We are closed weekends and major holidays. You have access to a nurse at all times for urgent questions. Please call the main number to the clinic [phone] and follow the prompts.  For any non-urgent questions, you may also contact your provider using MyChart. We now offer e-Visits for anyone 43 and older to request care online for non-urgent symptoms. For details visit [email or web address].   Also download the MyChart app! Go to the app store, search "MyChart", open the app, select [facility], and log in with your MyChart username and password.  Masks are optional in the cancer centers. If you would like for your care team to wear a mask while they are taking care of you, please let them know. You may have one support person who is at least 86 years old accompany you for your appointments.

## 2022-02-14 NOTE — Progress Notes (Signed)
Patient okay for treatment today, per Dr. Delton Coombes with additional orders for 1 unit PRBC tomorrow. Goal per Dr. Delton Coombes is to keep his hemoglobin 8 or above d/t his cardiac issues. Per Dr. Delton Coombes we will hold next week's treatment but have labs drawn for possible blood. Next treatment will be in two weeks. Patient tolerated Velcade injection with no complaints voiced. Lab work reviewed. See MAR for details. Injection site clean and dry with no bruising or swelling noted. Patient stable during and after injection. Band aid applied. Patient tolerated chemotherapy with no complaints voiced. Side effects with management reviewed understanding verbalized. Port site clean and dry with no bruising or swelling noted at site. Good blood return noted before and after administration of chemotherapy. Band aid applied. Patient left in satisfactory condition with VSS and no s/s of distress noted.

## 2022-02-14 NOTE — Progress Notes (Signed)
Patient has been examined by Dr. Delton Coombes, and vital signs and labs have been reviewed. ANC, Creatinine, LFTs, hemoglobin, and platelets are within treatment parameters per M.D. - pt may proceed with treatment.  Pt will receive 1 unit of PRBC tomorrow. Primary RN and pharmacy notified.

## 2022-02-14 NOTE — Progress Notes (Signed)
[place], [place] [address]   CLINIC:  Medical Oncology/Hematology  PCP:  [facility], [place] / [place] [address] [phone]   REASON FOR VISIT:  Follow-up for multiple myeloma and anemia  PRIOR THERAPY: none  CURRENT THERAPY: Weekly Velcade and dexamethasone  BRIEF ONCOLOGIC HISTORY:  Oncology History  Multiple myeloma ([facility])  01/15/2021 Initial Diagnosis   Multiple myeloma ([facility])   06/25/2021 - 12/06/2021 Chemotherapy   Patient is on Treatment Plan : MYELOMA NON-TRANSPLANT CANDIDATES VRd weekly q21d     12/20/2021 -  Chemotherapy   Patient is on Treatment Plan : MYELOMA Daratumumab IV q28d       CANCER STAGING:  Cancer Staging  No matching staging information was found for the patient.  INTERVAL HISTORY:  Dale [name], a 88 y.o. male, seen for follow-up of multiple myeloma.  He is accompanied by his son [name] today.  Recent hospitalization from 02/06/2022 through 02/08/2022 with chest pain, found to be noncardiac.  He received 1 unit of PRBC.  He does not report any chest pain at this time.  Energy levels are 75%.  Denies any recent falls.  REVIEW OF SYSTEMS:  Review of Systems  All other systems reviewed and are negative.   PAST MEDICAL/SURGICAL HISTORY:  Past Medical History:  Diagnosis Date   Allergic rhinitis    Anal fissure    Anginal pain (HCC)    Asthma    Cancer (HCC)    skin   Chest pain    CHF (congestive heart failure) ([facility])    Coronary heart disease    s/p stenting. cath in 01/2012 noncritical occlusion   Dyspnea    Dysrhythmia    1st degree heart block   GERD (gastroesophageal reflux disease)    Glaucoma    Hiatal hernia    Hyperlipidemia    Hypertension    Hypothyroidism    Idiopathic thrombocytopenic purpura ([facility]) 2002   Macular degeneration    Nephrolithiasis    PUD (peptic ulcer disease)    remote   Sarcoidosis    pulmonary   Schatzki's ring    Past Surgical  History:  Procedure Laterality Date   cardiac stents     COLONOSCOPY  10/30/2006   Normal rectum, sigmoid diverticula.Remainder of colonic mucosa appeared normal.   CORONARY ANGIOPLASTY WITH STENT PLACEMENT     about 10 years ago per pt (around 2007)   [place], URETEROSCOPY AND STENT PLACEMENT Left 06/16/2017   Procedure: CYSTOSCOPY WITH RETROGRADE PYELOGRAM, URETEROSCOPY,STONE EXTRACTION  AND STENT PLACEMENT;  Surgeon: Franchot Gallo, MD;  Location: [facility];  Service: Urology;  Laterality: Left;   ESOPHAGOGASTRODUODENOSCOPY  06/19/2004   Two esophageal rings and esophageal web as described above.  All of these were disrupted by passing 56-French Venia Minks dilator/ Candida esophagitis,which appears to be incidental given history of   antibiotic use, but nevertheless will be treated.   ESOPHAGOGASTRODUODENOSCOPY  10/30/2006   Distal tandem esophageal ring status post dilation disruption as  described above.  Otherwise normal esophagus/  Small hiatal hernia otherwise normal stomach, D1 and D2   ESOPHAGOGASTRODUODENOSCOPY N/A 03/22/2015   Dr.Rourk- noncritical schatzki's ring and hiatal hernia-o/w normal EGD.    ESOPHAGOGASTRODUODENOSCOPY (EGD) WITH ESOPHAGEAL DILATION  03/04/2012   RMR- schatzki's ring, hiatal hernia, polypoid gastric mucosa, bx= minimally active gastritis.   HOLMIUM LASER APPLICATION Left 06/27/9796   Procedure: HOLMIUM LASER APPLICATION;  Surgeon: Franchot Gallo, MD;  Location: [facility];  Service: Urology;  Laterality: Left;   IR CV LINE INJECTION  12/27/2021   IR GENERIC HISTORICAL  03/06/2016   IR RADIOLOGIST EVAL & MGMT 03/06/2016 Aletta Edouard, MD [facility] INTERV RAD   IR GENERIC HISTORICAL  06/18/2016   IR RADIOLOGIST EVAL & MGMT 06/18/2016 Aletta Edouard, MD [facility] INTERV RAD   IR IMAGING GUIDED PORT INSERTION  12/18/2021   IR IMAGING GUIDED PORT INSERTION  01/15/2022   IR RADIOLOGIST EVAL & MGMT  10/01/2016   IR RADIOLOGIST EVAL & MGMT   10/15/2017   IR RADIOLOGIST EVAL & MGMT  12/24/2018   IR RADIOLOGIST EVAL & MGMT  01/04/2020   IR RADIOLOGIST EVAL & MGMT  06/20/2021   IR REMOVAL TUN ACCESS W/ PORT W/O FL MOD SED  01/15/2022   LEFT HEART CATH N/A 02/02/2012   Procedure: LEFT HEART CATH;  Surgeon: Lorretta Harp, MD;  Location: [facility] CATH LAB;  Service: Cardiovascular;  Laterality: N/A;   MEDIASTINOSCOPY     for dx sarcoid   RADIOLOGY WITH ANESTHESIA Left 05/17/2016   Procedure: left renal ablation;  Surgeon: Aletta Edouard, MD;  Location: [facility];  Service: Radiology;  Laterality: Left;    SOCIAL HISTORY:  Social History   Socioeconomic History   Marital status: Married    Spouse name: Not on file   Number of children: 1   Years of education: Not on file   Highest education level: Not on file  Occupational History   Occupation: Retired    Comment: [employer]: RETIRED  Tobacco Use   Smoking status: Former    Packs/day: 0.10    Years: 2.00    Total pack years: 0.20    Types: Cigarettes, Cigars    Quit date: 05/06/1970    Years since quitting: 51.8   Smokeless tobacco: Never  Vaping Use   Vaping Use: Never used  Substance and Sexual Activity   Alcohol use: No    Alcohol/week: 0.0 standard drinks of alcohol   Drug use: No   Sexual activity: Never  Other Topics Concern   Not on file  Social History Narrative   Not on file   Social Determinants of Health   Financial Resource Strain: Not on file  Food Insecurity: No Food Insecurity (02/06/2022)   Hunger Vital Sign    Worried About Running Out of Food in the Last Year: Never true    Ran Out of Food in the Last Year: Never true  Transportation Needs: No Transportation Needs (02/06/2022)   PRAPARE - Hydrologist (Medical): No    Lack of Transportation (Non-Medical): No  Physical Activity: Not on file  Stress: Not on file  Social Connections: Not on file  Intimate Partner Violence: Not At Risk (02/06/2022)    Humiliation, Afraid, Rape, and Kick questionnaire    Fear of Current or Ex-Partner: No    Emotionally Abused: No    Physically Abused: No    Sexually Abused: No    FAMILY HISTORY:  Family History  Problem Relation Age of Onset   Heart disease Father        deceased age 43   Stroke Mother    Alzheimer's disease Mother    Heart attack Brother        deceased age 38   Cancer Other        niece   Colon cancer Neg Hx     CURRENT MEDICATIONS:  Current Outpatient Medications  Medication Sig Dispense Refill   acetaminophen (TYLENOL) 325 MG tablet Take 2 tablets (650 mg total) by mouth every 6 (six) hours as needed for mild pain (or Fever >/= 101).     acyclovir (ZOVIRAX) 400 MG tablet TAKE 1 TABLET BY MOUTH TWICE  DAILY 180 tablet 0   Ascorbic Acid (VITAMIN C PO) Take 500 mg by mouth every evening.     aspirin EC 81 MG tablet Take 1 tablet (81 mg total) by mouth daily. Swallow whole. 30 tablet 12   atorvastatin (LIPITOR) 40 MG tablet TAKE 1 TABLET BY MOUTH IN  THE EVENING (Patient taking differently: Take 40 mg by mouth daily.) 90 tablet 3   Bortezomib (VELCADE IJ) Inject as directed once a week.     brimonidine (ALPHAGAN) 0.2 % ophthalmic solution Place 1 drop into the left eye 2 (two) times daily.     cyanocobalamin 1000 MCG tablet Take 1 tablet (1,000 mcg total) by mouth daily. 30 tablet 3   dexamethasone (DECADRON) 2 MG tablet TAKE 5 TABLETS BY MOUTH ONCE  WEEKLY 60 tablet 0   dorzolamide-timolol (COSOPT) 22.3-6.8 MG/ML ophthalmic solution Place 1 drop into the left eye 2 (two) times daily.     feeding supplement (ENSURE ENLIVE / ENSURE PLUS) LIQD Take 237 mLs by mouth 3 (three) times daily between meals.     furosemide (LASIX) 20 MG tablet If you notice any swelling, you may take 40 mg (two tablets) for the day. (Patient taking differently: Take 20 mg by mouth 2 (two) times daily.) 90 tablet 3   isosorbide mononitrate (IMDUR) 60 MG 24 hr tablet Take 1 tablet (60 mg total) by mouth  daily. TAKE 1 AND 1/2 TABLETS BY  MOUTH IN THE MORNING AND  1/2 TABLET AT NIGHT     levothyroxine (SYNTHROID) 75 MCG tablet Take 75 mcg by mouth daily before breakfast.     lidocaine-prilocaine (EMLA) cream Apply 1 Application topically as needed. 30 g 1   losartan (COZAAR) 100 MG tablet TAKE 1 TABLET BY MOUTH  DAILY (Patient taking differently: Take 100 mg by mouth daily.) 90 tablet 1   metoprolol tartrate (LOPRESSOR) 25 MG tablet TAKE 1 TABLET BY MOUTH IN  THE MORNING AND ONE-HALF  TABLET BY MOUTH IN THE  EVENING (Patient taking differently: Take 12.5-25 mg by mouth in the morning and at bedtime. TAKE 1 TABLET BY MOUTH IN  THE MORNING AND ONE-HALF  TABLET BY MOUTH IN THE  EVENING) 135 tablet 3   Multiple Vitamins-Minerals (PRESERVISION/LUTEIN) CAPS Take 1 capsule by mouth 2 (two) times daily.     nitroGLYCERIN (NITROSTAT) 0.4 MG SL tablet Place 1 tablet (0.4 mg total) under the tongue every 5 (five) minutes as needed. For chest pain 25 tablet 2   pantoprazole (PROTONIX) 40 MG tablet TAKE 1 TABLET BY MOUTH DAILY  BEFORE BREAKFAST 90 tablet 3   potassium chloride SA (KLOR-CON M) 20 MEQ tablet TAKE 1 TABLET BY MOUTH  DAILY (Patient taking differently: Take 20 mEq by mouth daily.) 90 tablet 3   prochlorperazine (COMPAZINE) 10 MG tablet Take 1 tablet (10 mg total) by mouth every 6 (six) hours as needed for nausea or vomiting. 30 tablet 3   ROCKLATAN 0.02-0.005 % SOLN Place 1 drop into the left eye at bedtime.     triamcinolone (KENALOG) 0.1 % cream Apply 1 application. topically 2 (two) times daily as needed (for irritation).     vitamin E 200 UNIT capsule Take 200 Units by mouth every  evening.     No current facility-administered medications for this visit.   Facility-Administered Medications Ordered in Other Visits  Medication Dose Route Frequency Provider Last Rate Last Admin   acetaminophen (TYLENOL) 325 MG tablet            diphenhydrAMINE (BENADRYL) 25 mg capsule            montelukast  (SINGULAIR) 10 MG tablet            sodium chloride flush (NS) 0.9 % injection 10 mL  10 mL Intracatheter PRN Derek Jack, MD   10 mL at 02/14/22 1511    ALLERGIES:  Allergies  Allergen Reactions   Azithromycin Other (See Comments)    Sore mouth and fever blisters around mouth, sores in nose area as well   Doxazosin Shortness Of Breath   Atenolol Other (See Comments)    UNKNOWN REACTION   Hydrocodone Nausea And Vomiting   Levofloxacin Other (See Comments)    Caused stomach problems.   Morphine Other (See Comments)    "made me crazy"   Penicillins Nausea And Vomiting and Other (See Comments)    Has patient had a PCN reaction causing immediate rash, facial/tongue/throat swelling, SOB or lightheadedness with hypotension: No Has patient had a PCN reaction causing severe rash involving mucus membranes or skin necrosis: No Has patient had a PCN reaction that required hospitalization No Has patient had a PCN reaction occurring within the last 10 years: No If all of the above answers are "NO", then may proceed with Cephalosporin use.    Sulfonamide Derivatives Nausea And Vomiting    PHYSICAL EXAM:  Performance status (ECOG): 0 - Asymptomatic  There were no vitals filed for this visit.  Wt Readings from Last 3 Encounters:  02/14/22 122 lb 12.8 oz (55.7 kg)  02/06/22 120 lb 9.5 oz (54.7 kg)  01/31/22 120 lb 11.2 oz (54.7 kg)   Physical Exam Vitals reviewed.  Constitutional:      Appearance: Normal appearance.  Cardiovascular:     Rate and Rhythm: Normal rate and regular rhythm.     Pulses: Normal pulses.     Heart sounds: Normal heart sounds.  Pulmonary:     Effort: Pulmonary effort is normal.     Breath sounds: Normal breath sounds.  Neurological:     General: No focal deficit present.     Mental Status: He is alert and oriented to person, place, and time.  Psychiatric:        Mood and Affect: Mood normal.        Behavior: Behavior normal.     LABORATORY DATA:   I have reviewed the labs as listed.     Latest Ref Rng & Units 02/14/2022    8:33 AM 02/08/2022    5:29 AM 02/07/2022    4:20 AM  CBC  WBC 4.0 - 10.5 K/uL 3.2  1.7  1.7   Hemoglobin 13.0 - 17.0 g/dL 7.5  8.0  7.0   Hematocrit 39.0 - 52.0 % 23.2  24.2  21.3   Platelets 150 - 400 K/uL 107  55  59       Latest Ref Rng & Units 02/14/2022    8:33 AM 02/07/2022    4:20 AM 02/06/2022    4:56 PM  CMP  Glucose 70 - 99 mg/dL 111  90  106   BUN 8 - 23 mg/dL 18  17  19    Creatinine 0.61 - 1.24 mg/dL 0.72  0.78  0.85  Sodium 135 - 145 mmol/L 133  132  132   Potassium 3.5 - 5.1 mmol/L 4.1  3.8  4.2   Chloride 98 - 111 mmol/L 107  104  104   CO2 22 - 32 mmol/L 23  22  24    Calcium 8.9 - 10.3 mg/dL 8.6  8.2  8.4   Total Protein 6.5 - 8.1 g/dL 8.5  7.6  8.6   Total Bilirubin 0.3 - 1.2 mg/dL 0.6  0.7  0.4   Alkaline Phos 38 - 126 U/L 60  56  60   AST 15 - 41 U/L 19  18  20    ALT 0 - 44 U/L 22  21  24      DIAGNOSTIC IMAGING:  I have independently reviewed the scans and discussed with the patient. ECHOCARDIOGRAM COMPLETE  Result Date: 02/07/2022    ECHOCARDIOGRAM REPORT   Patient Name:   RAJU COPPOLINO Date of Exam: 02/07/2022 Medical Rec #:  [id number]      Height:       68.0 in Accession #:    [id number]     Weight:       120.6 lb Date of Birth:  1928-10-15      BSA:          1.648 m Patient Age:    69 years       BP:           124/55 mmHg Patient Gender: M              HR:           72 bpm. Exam Location:  Inpatient Procedure: 3D Echo, 2D Echo, Cardiac Doppler and Color Doppler Indications:    Chest Pain R07.9  History:        Patient has prior history of Echocardiogram examinations, most                 recent 05/10/2021. CHF; Risk Factors:Hypertension and                 Dyslipidemia. GERD.  Sonographer:    Darlina Sicilian RDCS Referring Phys: [phone] DAVID TAT IMPRESSIONS  1. Left ventricular ejection fraction, by estimation, is 65 to 70%. The left ventricle has normal function. The left ventricle has  no regional wall motion abnormalities. Left ventricular diastolic parameters were normal.  2. Right ventricular systolic function is normal. The right ventricular size is normal.  3. The mitral valve is normal in structure. Mild mitral valve regurgitation.  4. The aortic valve is tricuspid. Aortic valve regurgitation is not visualized.  5. The inferior vena cava is normal in size with greater than 50% respiratory variability, suggesting right atrial pressure of 3 mmHg. Comparison(s): The left ventricular function is unchanged. FINDINGS  Left Ventricle: Left ventricular ejection fraction, by estimation, is 65 to 70%. The left ventricle has normal function. The left ventricle has no regional wall motion abnormalities. The left ventricular internal cavity size was normal in size. There is  no left ventricular hypertrophy. Left ventricular diastolic parameters were normal. Right Ventricle: The right ventricular size is normal. Right vetricular wall thickness was not assessed. Right ventricular systolic function is normal. Left Atrium: Left atrial size was normal in size. Right Atrium: Right atrial size was normal in size. Pericardium: There is no evidence of pericardial effusion. Mitral Valve: The mitral valve is normal in structure. Mild mitral valve regurgitation. Tricuspid Valve: The tricuspid valve is normal in structure. Tricuspid valve regurgitation is mild.  Aortic Valve: The aortic valve is tricuspid. Aortic valve regurgitation is not visualized. Pulmonic Valve: The pulmonic valve was normal in structure. Pulmonic valve regurgitation is not visualized. Aorta: The aortic root and ascending aorta are structurally normal, with no evidence of dilitation. Venous: The inferior vena cava is normal in size with greater than 50% respiratory variability, suggesting right atrial pressure of 3 mmHg. IAS/Shunts: No atrial level shunt detected by color flow Doppler.  LEFT VENTRICLE PLAX 2D LVIDd:         4.20 cm   Diastology  LVIDs:         2.90 cm   LV e' medial:    7.29 cm/s LV PW:         1.10 cm   LV E/e' medial:  12.2 LV IVS:        1.10 cm   LV e' lateral:   8.92 cm/s LVOT diam:     2.10 cm   LV E/e' lateral: 10.0 LV SV:         73 LV SV Index:   45 LVOT Area:     3.46 cm                           3D Volume EF:                          3D EF:        73 %                          LV EDV:       126 ml                          LV ESV:       34 ml                          LV SV:        92 ml RIGHT VENTRICLE RV S prime:     16.50 cm/s TAPSE (M-mode): 1.4 cm LEFT ATRIUM             Index        RIGHT ATRIUM           Index LA diam:        3.60 cm 2.18 cm/m   RA Area:     11.80 cm LA Vol (A2C):   40.3 ml 24.45 ml/m  RA Volume:   23.40 ml  14.20 ml/m LA Vol (A4C):   34.9 ml 21.17 ml/m LA Biplane Vol: 41.6 ml 25.24 ml/m  AORTIC VALVE LVOT Vmax:   118.00 cm/s LVOT Vmean:  76.500 cm/s LVOT VTI:    0.212 m  AORTA Ao Root diam: 3.00 cm Ao Asc diam:  3.20 cm MITRAL VALVE MV Area (PHT): 4.04 cm    SHUNTS MV Decel Time: 188 msec    Systemic VTI:  0.21 m MV E velocity: 89.10 cm/s  Systemic Diam: 2.10 cm MV A velocity: 87.80 cm/s MV E/A ratio:  1.01 Dorris Carnes MD Electronically signed by Dorris Carnes MD Signature Date/Time: 02/07/2022/5:14:29 PM    Final    CT Angio Chest PE W and/or Wo Contrast  Result Date: 02/06/2022 CLINICAL DATA:  Left-sided chest pain since 2 p.m., history of coronary artery disease, positive D dimer  EXAM: CT ANGIOGRAPHY CHEST WITH CONTRAST TECHNIQUE: Multidetector CT imaging of the chest was performed using the standard protocol during bolus administration of intravenous contrast. Multiplanar CT image reconstructions and MIPs were obtained to evaluate the vascular anatomy. RADIATION DOSE REDUCTION: This exam was performed according to the departmental dose-optimization program which includes automated exposure control, adjustment of the mA and/or kV according to patient size and/or use of iterative reconstruction  technique. CONTRAST:  75m OMNIPAQUE IOHEXOL 350 MG/ML SOLN COMPARISON:  02/06/2022, 01/16/2022 FINDINGS: Cardiovascular: This is a technically adequate evaluation of the pulmonary vasculature. No filling defects or pulmonary emboli. Dilated main pulmonary artery measuring 3.6 cm consistent with pulmonary arterial hypertension. The heart is unremarkable without pericardial effusion. Diffuse atherosclerosis throughout the coronary vasculature. No evidence of thoracic aortic aneurysm or dissection. Stable aortic atherosclerosis. Left chest wall port via internal jugular approach tip in the superior vena cava. Mediastinum/Nodes: Stable calcified mediastinal lymph nodes consistent with known history of sarcoidosis. Thyroid, trachea, and esophagus are stable. Lungs/Pleura: Chronic areas of bilateral parenchymal scarring, most pronounced within the superior segment of the right lower lobe. No acute airspace disease, effusion, or pneumothorax. The central airways are patent. Upper Abdomen: No acute abnormality. Musculoskeletal: No acute or destructive bony lesions. Reconstructed images demonstrate no additional findings. Review of the MIP images confirms the above findings. IMPRESSION: 1. No evidence of pulmonary embolus. 2. Pulmonary scarring and calcified mediastinal lymph nodes consistent with known history of sarcoidosis. 3. Dilated main pulmonary artery consistent with pulmonary arterial hypertension. 4. Aortic Atherosclerosis (ICD10-I70.0). Coronary artery atherosclerosis. Electronically Signed   By: MRanda NgoM.D.   On: 02/06/2022 20:18   DG Chest Port 1 View  Result Date: 02/06/2022 CLINICAL DATA:  Left-sided chest pain. EXAM: PORTABLE CHEST 1 VIEW COMPARISON:  December 24, 2021 FINDINGS: The right-sided venous Port-A-Cath seen on the prior study has been removed and has been replaced with a left-sided venous Port-A-Cath. Its distal tip is seen at the junction of the superior vena cava and right atrium. The  heart size and mediastinal contours are within normal limits. Mild, chronic appearing diffusely increased lung markings are seen. Mild linear atelectasis is noted within the mid to upper right lung. There is no evidence of a pleural effusion or pneumothorax. Multilevel degenerative changes are seen throughout the thoracic spine. IMPRESSION: Mild, chronic appearing increased lung markings with mild linear atelectasis within the mid to upper right lung. Electronically Signed   By: TVirgina NorfolkM.D.   On: 02/06/2022 16:55   CT CHEST ABDOMEN PELVIS W CONTRAST  Result Date: 01/16/2022 CLINICAL DATA:  Trauma, fall EXAM: CT CHEST, ABDOMEN, AND PELVIS WITH CONTRAST TECHNIQUE: Multidetector CT imaging of the chest, abdomen and pelvis was performed following the standard protocol during bolus administration of intravenous contrast. RADIATION DOSE REDUCTION: This exam was performed according to the departmental dose-optimization program which includes automated exposure control, adjustment of the mA and/or kV according to patient size and/or use of iterative reconstruction technique. CONTRAST:  863mOMNIPAQUE IOHEXOL 300 MG/ML  SOLN COMPARISON:  Previous studies including the CT abdomen done on 06/14/2021 FINDINGS: CT CHEST FINDINGS Cardiovascular: There is homogeneous enhancement in thoracic aorta. There are no intraluminal filling defects in central pulmonary artery branches. There is ectasia of the main pulmonary artery measuring 3.4 cm suggesting pulmonary arterial hypertension. Coronary artery calcifications are seen. Mediastinum/Nodes: There is no mediastinal hematoma. Left IJ chest port is noted with the tip of the catheter in superior vena cava. There are pockets of air in  subcutaneous plane in the anterior chest, more so on the left side, possibly suggesting recent accessing of the chest port. There is a 2.4 cm loculated air containing structure in subcutaneous plane in the right anterior chest wall.  Lungs/Pleura: Centrilobular and panlobular emphysema is seen. Increased interstitial markings are seen in the posterior segment of right upper lobe and in posterior right lower lobe. There is ectasia bronchi in the area of increased interstitial markings. Findings may suggest scarring or interstitial pneumonia. There are linear densities in right middle lobe and lingula suggesting scarring or subsegmental atelectasis. Minimal left pleural effusion is seen. There is no pneumothorax. Musculoskeletal: No acute findings are seen in bony structures in the thorax. CT ABDOMEN PELVIS FINDINGS Hepatobiliary: There are few low-density lesions in liver largest measuring 3.4 cm in the left lobe, possibly cysts. There is no dilation of bile ducts. Gallbladder stones are seen. There is no wall thickening in gallbladder. Pancreas: No focal abnormalities are seen. Spleen: Unremarkable. Adrenals/Urinary Tract: Adrenals are unremarkable. There is no hydronephrosis. There is 2 mm calculus in the right kidney. There is 1 mm calculus in the lower pole of left kidney. There is focal cortical thinning in the lateral aspect of left kidney, possibly related to previous intervention or pyelonephritis. There are no ureteral stones. Urinary bladder is unremarkable. Stomach/Bowel: Stomach is unremarkable. Small bowel loops are not dilated. Appendix is not dilated. There is no significant wall thickening in colon. There is no pericardial stranding. Diverticula are seen in the colon without signs of focal acute diverticulitis. Vascular/Lymphatic: Scattered calcifications are seen in aorta and its major branches. No significant lymphadenopathy is seen. Reproductive: Unremarkable. Other: There is no ascites or pneumoperitoneum. Right inguinal hernia containing fat is seen. Musculoskeletal: No recent fracture is seen in bony structures. IMPRESSION: No acute findings are seen in CT scan of chest, abdomen and pelvis. There is no focal pulmonary  consolidation. There is no pleural effusion or pneumothorax. There is no evidence of mediastinal hematoma. Coronary artery disease. Ectasia of the main pulmonary artery suggests pulmonary arterial hypertension. There is no laceration in the solid organs. There is no bowel wall thickening. There is no ascites or pneumoperitoneum. No displaced fractures are seen in bony structures. There is loculated pocket of air in the right anterior chest wall, most likely suggesting recent removal of right chest port. There are pockets of air adjacent to left IJ chest port suggesting recent placement of left chest port. COPD. There are scattered foci of increased interstitial markings, especially in posterior segment of right upper lobe and superior segment of right lower lobe suggesting scarring. Hepatic cysts. Small bilateral renal stones. Diverticulosis of colon. Other findings as described in the body of the report. Electronically Signed   By: Elmer Picker M.D.   On: 01/16/2022 17:30   CT Head Wo Contrast  Result Date: 01/16/2022 CLINICAL DATA:  Fall, neck trauma EXAM: CT HEAD WITHOUT CONTRAST CT CERVICAL SPINE WITHOUT CONTRAST TECHNIQUE: Multidetector CT imaging of the head and cervical spine was performed following the standard protocol without intravenous contrast. Multiplanar CT image reconstructions of the cervical spine were also generated. RADIATION DOSE REDUCTION: This exam was performed according to the departmental dose-optimization program which includes automated exposure control, adjustment of the mA and/or kV according to patient size and/or use of iterative reconstruction technique. COMPARISON:  CT head 01/04/2021 FINDINGS: CT HEAD FINDINGS Brain: Generalized atrophy without hydrocephalus. Patchy white matter hypodensity bilaterally unchanged. Negative for acute infarct, hemorrhage, mass Vascular: Negative for hyperdense  vessel Skull: Negative calvarium Sinuses/Orbits: Expansile soft tissue mass in  the left maxillary antrum is unchanged. This extends into the right nasal cavity. No aggressive bony destruction. Other: None CT CERVICAL SPINE FINDINGS Alignment: Normal Skull base and vertebrae: Negative for fracture or mass Soft tissues and spinal canal: No soft tissue mass or adenopathy. Atherosclerotic calcification of the carotid bifurcation bilaterally left jugular vascular catheter with gas in the soft tissues likely due to recent placement. Disc levels: Mild disc degeneration. Moderate bilateral facet degeneration. Upper chest: Apical emphysema without acute abnormality Other: None IMPRESSION: 1. No acute intracranial injury. 2. Negative for cervical spine fracture 3. Expansile soft tissue mass in the left maxillary sinus extending into the left nasal cavity. This is unchanged from CT of 01/04/2021. 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 Electronically Signed   By: Franchot Gallo M.D.   On: 01/16/2022 17:17   CT Cervical Spine Wo Contrast  Result Date: 01/16/2022 CLINICAL DATA:  Fall, neck trauma EXAM: CT HEAD WITHOUT CONTRAST CT CERVICAL SPINE WITHOUT CONTRAST TECHNIQUE: Multidetector CT imaging of the head and cervical spine was performed following the standard protocol without intravenous contrast. Multiplanar CT image reconstructions of the cervical spine were also generated. RADIATION DOSE REDUCTION: This exam was performed according to the departmental dose-optimization program which includes automated exposure control, adjustment of the mA and/or kV according to patient size and/or use of iterative reconstruction technique. COMPARISON:  CT head 01/04/2021 FINDINGS: CT HEAD FINDINGS Brain: Generalized atrophy without hydrocephalus. Patchy white matter hypodensity bilaterally unchanged. Negative for acute infarct, hemorrhage, mass Vascular: Negative for hyperdense vessel Skull: Negative calvarium Sinuses/Orbits: Expansile soft tissue mass in the left maxillary antrum is unchanged. This extends into the right  nasal cavity. No aggressive bony destruction. Other: None CT CERVICAL SPINE FINDINGS Alignment: Normal Skull base and vertebrae: Negative for fracture or mass Soft tissues and spinal canal: No soft tissue mass or adenopathy. Atherosclerotic calcification of the carotid bifurcation bilaterally left jugular vascular catheter with gas in the soft tissues likely due to recent placement. Disc levels: Mild disc degeneration. Moderate bilateral facet degeneration. Upper chest: Apical emphysema without acute abnormality Other: None IMPRESSION: 1. No acute intracranial injury. 2. Negative for cervical spine fracture 3. Expansile soft tissue mass in the left maxillary sinus extending into the left nasal cavity. This is unchanged from CT of 01/04/2021. 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 Electronically Signed   By: Franchot Gallo M.D.   On: 01/16/2022 17:17     ASSESSMENT:  1.  IgG lambda plasma cell myeloma: - Work-up for macrocytic anemia on 12/01/2020 showed M spike of 2.9 g.  Immunofixation IgG lambda. - Lambda light chains elevated at 284.  Light chain ratio was 0.04.  LDH was 163.  Creatinine was 0.9 and calcium 8.9. - Bone marrow biopsy on 12/18/2020-hypercellular marrow for age with 48% atypical plasma cells with lambda light chain restriction. - Chromosome analysis was normal. 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 - Revlimid 5 mg, 2 weeks on/1 week off started on 01/20/2021.  Dexamethasone weekly 10 mg added on 02/07/2021.  Revlimid is discontinued around 06/15/2021 due to poor tolerance and ineffectiveness at low-dose.  Thrombocytopenia and anemia. - Velcade weekly on days 1, 8, 15 every 21 days along with dexamethasone 10 mg weekly started on 06/25/2021.  2.  Macrocytic anemia: - CBC on 12/01/2020 with hemoglobin 8.5 and MCV of 117. - Denies any bleeding per rectum or melena.  3.  Social/family history: - Lives at home with  his wife.  He does all ADLs and IADLs.  He even does yard work. - He worked at [employer] for 35 years.  Denies any chemical exposure.  Non-smoker. - No family history of malignancies.   PLAN:  1.  IgG lambda plasma cell myeloma, high risk: - He has recent recurrent hospitalizations. - We have discussed stopping treatments with the comfort care.  Patient would like to continue treatments at this time.  He and his son does not think that hospitalizations are not occurring due to treatments. - I have reviewed labs today which showed normal LFTs.  CBC shows improved platelet count and white count.  Hemoglobin is 7.5. - Recommend starting him back on Darzalex and Velcade today.  We will plan to treat every other week initially.  I will reevaluate him in 2 to 3 weeks.  If needed we can switch back to weekly treatment.   2.  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  Hemoglobin is 7.5 today. - We will continue to monitor his CBC every week.  Will maintain his hemoglobin above 8 as he is having chest pains.  3.  Thromboprophylaxis: - He had a skin tear on the right shin and bled extensively.  We will hold off on aspirin 81 mg at this time.  4.  Right ankle swelling: - Continue Lasix as needed.  5.  ID prophylaxis: - Continue acyclovir twice daily.   Orders placed this encounter:  No orders of the defined types were placed in this encounter.      Derek Jack, MD [facility] [phone]

## 2022-02-15 ENCOUNTER — Encounter: Payer: Self-pay | Admitting: Cardiovascular Disease

## 2022-02-15 ENCOUNTER — Inpatient Hospital Stay: Payer: Medicare Other

## 2022-02-15 DIAGNOSIS — Z79899 Other long term (current) drug therapy: Secondary | ICD-10-CM | POA: Diagnosis not present

## 2022-02-15 DIAGNOSIS — Z5112 Encounter for antineoplastic immunotherapy: Secondary | ICD-10-CM | POA: Diagnosis not present

## 2022-02-15 DIAGNOSIS — C9000 Multiple myeloma not having achieved remission: Secondary | ICD-10-CM

## 2022-02-15 DIAGNOSIS — D6481 Anemia due to antineoplastic chemotherapy: Secondary | ICD-10-CM | POA: Diagnosis not present

## 2022-02-15 LAB — CBC WITH DIFFERENTIAL/PLATELET
Abs Immature Granulocytes: 0.01 10*3/uL (ref 0.00–0.07)
Basophils Absolute: 0 10*3/uL (ref 0.0–0.1)
Basophils Relative: 0 %
Eosinophils Absolute: 0 10*3/uL (ref 0.0–0.5)
Eosinophils Relative: 1 %
HCT: 23.2 % — ABNORMAL LOW (ref 39.0–52.0)
Hemoglobin: 7.5 g/dL — ABNORMAL LOW (ref 13.0–17.0)
Immature Granulocytes: 0 %
Lymphocytes Relative: 32 %
Lymphs Abs: 1 10*3/uL (ref 0.7–4.0)
MCH: 33.8 pg (ref 26.0–34.0)
MCHC: 32.3 g/dL (ref 30.0–36.0)
MCV: 104.5 fL — ABNORMAL HIGH (ref 80.0–100.0)
Monocytes Absolute: 0.2 10*3/uL (ref 0.1–1.0)
Monocytes Relative: 8 %
Neutro Abs: 1.9 10*3/uL (ref 1.7–7.7)
Neutrophils Relative %: 59 %
Platelets: 107 10*3/uL — ABNORMAL LOW (ref 150–400)
RBC: 2.22 MIL/uL — ABNORMAL LOW (ref 4.22–5.81)
RDW: 24.6 % — ABNORMAL HIGH (ref 11.5–15.5)
WBC: 3.2 10*3/uL — ABNORMAL LOW (ref 4.0–10.5)
nRBC: 0 % (ref 0.0–0.2)

## 2022-02-15 MED ORDER — ACETAMINOPHEN 325 MG PO TABS
650.0000 mg | ORAL_TABLET | Freq: Once | ORAL | Status: AC
  Administered 2022-02-15: 650 mg via ORAL
  Filled 2022-02-15: qty 2

## 2022-02-15 MED ORDER — SODIUM CHLORIDE 0.9% IV SOLUTION
250.0000 mL | Freq: Once | INTRAVENOUS | Status: AC
  Administered 2022-02-15: 250 mL via INTRAVENOUS

## 2022-02-15 MED ORDER — HEPARIN SOD (PORK) LOCK FLUSH 100 UNIT/ML IV SOLN
500.0000 [IU] | Freq: Every day | INTRAVENOUS | Status: AC | PRN
  Administered 2022-02-15: 500 [IU]

## 2022-02-15 MED ORDER — NITROGLYCERIN 0.4 MG SL SUBL: 0.4000 mg | SUBLINGUAL_TABLET | SUBLINGUAL | 2 refills | Status: DC | PRN

## 2022-02-15 MED ORDER — SODIUM CHLORIDE 0.9% FLUSH
10.0000 mL | INTRAVENOUS | Status: AC | PRN
  Administered 2022-02-15: 10 mL

## 2022-02-15 MED ORDER — DIPHENHYDRAMINE HCL 25 MG PO CAPS
25.0000 mg | ORAL_CAPSULE | Freq: Once | ORAL | Status: AC
  Administered 2022-02-15: 25 mg via ORAL
  Filled 2022-02-15: qty 1

## 2022-02-15 NOTE — Progress Notes (Signed)
Patient presents today for one unit of PRBC.  Patient is in satisfactory condition with no new complaints voiced.  Vital signs are stable.  Hemoglobin on 02/14/22 was 7.5.  We will proceed with transfusion per MD orders.   Patient tolerated treatment well with no complaints voiced.  Patient left ambulatory in stable condition.  Vital signs stable at discharge.  Follow up as scheduled.

## 2022-02-15 NOTE — Patient Instructions (Signed)
[facility]  Discharge Instructions: Thank you for choosing [facility] to provide your oncology and hematology care.  If you have a lab appointment with the Cancer Center, please come in thru the Main Entrance and check in at the main information desk.  Wear comfortable clothing and clothing appropriate for easy access to any Portacath or PICC line.   We strive to give you quality time with your provider. You may need to reschedule your appointment if you arrive late (15 or more minutes).  Arriving late affects you and other patients whose appointments are after yours.  Also, if you miss three or more appointments without notifying the office, you may be dismissed from the clinic at the provider's discretion.      For prescription refill requests, have your pharmacy contact our office and allow 72 hours for refills to be completed.     To help prevent nausea and vomiting after your treatment, we encourage you to take your nausea medication as directed.  BELOW ARE SYMPTOMS THAT SHOULD BE REPORTED IMMEDIATELY: *FEVER GREATER THAN 100.4 F (38 C) OR HIGHER *CHILLS OR SWEATING *NAUSEA AND VOMITING THAT IS NOT CONTROLLED WITH YOUR NAUSEA MEDICATION *UNUSUAL SHORTNESS OF BREATH *UNUSUAL BRUISING OR BLEEDING *URINARY PROBLEMS (pain or burning when urinating, or frequent urination) *BOWEL PROBLEMS (unusual diarrhea, constipation, pain near the anus) TENDERNESS IN MOUTH AND THROAT WITH OR WITHOUT PRESENCE OF ULCERS (sore throat, sores in mouth, or a toothache) UNUSUAL RASH, SWELLING OR PAIN  UNUSUAL VAGINAL DISCHARGE OR ITCHING   Items with * indicate a potential emergency and should be followed up as soon as possible or go to the Emergency Department if any problems should occur.  Please show the CHEMOTHERAPY ALERT CARD or IMMUNOTHERAPY ALERT CARD at check-in to the Emergency Department and triage nurse.  Should you have questions after your visit or need to  cancel or reschedule your appointment, please contact [facility] [phone]  and follow the prompts.  Office hours are 8:00 a.m. to 4:30 p.m. Monday - Friday. Please note that voicemails left after 4:00 p.m. may not be returned until the following business day.  We are closed weekends and major holidays. You have access to a nurse at all times for urgent questions. Please call the main number to the clinic [phone] and follow the prompts.  For any non-urgent questions, you may also contact your provider using MyChart. We now offer e-Visits for anyone 18 and older to request care online for non-urgent symptoms. For details visit [email or web address].   Also download the MyChart app! Go to the app store, search "MyChart", open the app, select [facility], and log in with your MyChart username and password.  Masks are optional in the cancer centers. If you would like for your care team to wear a mask while they are taking care of you, please let them know. You may have one support person who is at least 86 years old accompany you for your appointments.  

## 2022-02-18 LAB — TYPE AND SCREEN
ABO/RH(D): O POS
Antibody Screen: POSITIVE
DAT, IgG: NEGATIVE
Unit division: 0
Unit division: 0

## 2022-02-18 LAB — BPAM RBC
Blood Product Expiration Date: 202311092359
Blood Product Expiration Date: 202311092359
ISSUE DATE / TIME: 202310131042
ISSUE DATE / TIME: 202310131042
Unit Type and Rh: 5100
Unit Type and Rh: 5100

## 2022-02-19 ENCOUNTER — Encounter: Payer: Self-pay | Admitting: Orthopaedic Surgery

## 2022-02-19 ENCOUNTER — Ambulatory Visit (INDEPENDENT_AMBULATORY_CARE_PROVIDER_SITE_OTHER): Payer: Medicare Other | Admitting: Orthopaedic Surgery

## 2022-02-19 VITALS — BP 190/82 | HR 72 | Ht 68.0 in | Wt 126.0 lb

## 2022-02-19 DIAGNOSIS — I25119 Atherosclerotic heart disease of native coronary artery with unspecified angina pectoris: Secondary | ICD-10-CM | POA: Diagnosis not present

## 2022-02-19 DIAGNOSIS — L03115 Cellulitis of right lower limb: Secondary | ICD-10-CM

## 2022-02-19 NOTE — Progress Notes (Signed)
My leg bled.  He had a scab on the right lower leg as a result from the cellulitis.  It pulled off and he had significant bleeding.  He called EMS and they wrapped it.  It has no further problems.  He has very small area of a small blood scab over a vein on the right lower leg.  It must have bled from the vein.  There is no redness today, negative exam.  Encounter Diagnosis  Name Primary?   Cellulitis of right lower leg Yes   I will see as needed.  Call if any problem.  Precautions discussed.  Electronically Signed Sanjuana Kava, MD 10/17/20239:15 AM

## 2022-02-20 DIAGNOSIS — D6481 Anemia due to antineoplastic chemotherapy: Secondary | ICD-10-CM | POA: Diagnosis not present

## 2022-02-20 DIAGNOSIS — I440 Atrioventricular block, first degree: Secondary | ICD-10-CM | POA: Diagnosis not present

## 2022-02-20 DIAGNOSIS — H3530 Unspecified macular degeneration: Secondary | ICD-10-CM | POA: Diagnosis not present

## 2022-02-20 DIAGNOSIS — Z87891 Personal history of nicotine dependence: Secondary | ICD-10-CM | POA: Diagnosis not present

## 2022-02-20 DIAGNOSIS — E039 Hypothyroidism, unspecified: Secondary | ICD-10-CM | POA: Diagnosis not present

## 2022-02-20 DIAGNOSIS — H409 Unspecified glaucoma: Secondary | ICD-10-CM | POA: Diagnosis not present

## 2022-02-20 DIAGNOSIS — I110 Hypertensive heart disease with heart failure: Secondary | ICD-10-CM | POA: Diagnosis not present

## 2022-02-20 DIAGNOSIS — N400 Enlarged prostate without lower urinary tract symptoms: Secondary | ICD-10-CM | POA: Diagnosis not present

## 2022-02-20 DIAGNOSIS — S51811D Laceration without foreign body of right forearm, subsequent encounter: Secondary | ICD-10-CM | POA: Diagnosis not present

## 2022-02-20 DIAGNOSIS — Z9181 History of falling: Secondary | ICD-10-CM | POA: Diagnosis not present

## 2022-02-20 DIAGNOSIS — D860 Sarcoidosis of lung: Secondary | ICD-10-CM | POA: Diagnosis not present

## 2022-02-20 DIAGNOSIS — I509 Heart failure, unspecified: Secondary | ICD-10-CM | POA: Diagnosis not present

## 2022-02-20 DIAGNOSIS — J45909 Unspecified asthma, uncomplicated: Secondary | ICD-10-CM | POA: Diagnosis not present

## 2022-02-20 DIAGNOSIS — Z7952 Long term (current) use of systemic steroids: Secondary | ICD-10-CM | POA: Diagnosis not present

## 2022-02-20 DIAGNOSIS — E785 Hyperlipidemia, unspecified: Secondary | ICD-10-CM | POA: Diagnosis not present

## 2022-02-20 DIAGNOSIS — C9000 Multiple myeloma not having achieved remission: Secondary | ICD-10-CM | POA: Diagnosis not present

## 2022-02-20 DIAGNOSIS — R627 Adult failure to thrive: Secondary | ICD-10-CM | POA: Diagnosis not present

## 2022-02-20 DIAGNOSIS — D61818 Other pancytopenia: Secondary | ICD-10-CM | POA: Diagnosis not present

## 2022-02-20 DIAGNOSIS — E46 Unspecified protein-calorie malnutrition: Secondary | ICD-10-CM | POA: Diagnosis not present

## 2022-02-20 DIAGNOSIS — I10 Essential (primary) hypertension: Secondary | ICD-10-CM | POA: Diagnosis not present

## 2022-02-20 DIAGNOSIS — D509 Iron deficiency anemia, unspecified: Secondary | ICD-10-CM | POA: Diagnosis not present

## 2022-02-20 DIAGNOSIS — I2510 Atherosclerotic heart disease of native coronary artery without angina pectoris: Secondary | ICD-10-CM | POA: Diagnosis not present

## 2022-02-20 DIAGNOSIS — T451X5D Adverse effect of antineoplastic and immunosuppressive drugs, subsequent encounter: Secondary | ICD-10-CM | POA: Diagnosis not present

## 2022-02-20 DIAGNOSIS — S51812D Laceration without foreign body of left forearm, subsequent encounter: Secondary | ICD-10-CM | POA: Diagnosis not present

## 2022-02-20 DIAGNOSIS — D693 Immune thrombocytopenic purpura: Secondary | ICD-10-CM | POA: Diagnosis not present

## 2022-02-21 ENCOUNTER — Other Ambulatory Visit: Payer: Medicare Other

## 2022-02-21 ENCOUNTER — Inpatient Hospital Stay: Payer: Medicare Other

## 2022-02-21 ENCOUNTER — Ambulatory Visit: Payer: Medicare Other

## 2022-02-21 DIAGNOSIS — Z23 Encounter for immunization: Secondary | ICD-10-CM | POA: Diagnosis not present

## 2022-02-21 DIAGNOSIS — Z79899 Other long term (current) drug therapy: Secondary | ICD-10-CM | POA: Diagnosis not present

## 2022-02-21 DIAGNOSIS — D6481 Anemia due to antineoplastic chemotherapy: Secondary | ICD-10-CM | POA: Diagnosis not present

## 2022-02-21 DIAGNOSIS — C9000 Multiple myeloma not having achieved remission: Secondary | ICD-10-CM

## 2022-02-21 DIAGNOSIS — Z5112 Encounter for antineoplastic immunotherapy: Secondary | ICD-10-CM | POA: Diagnosis not present

## 2022-02-21 LAB — SAMPLE TO BLOOD BANK

## 2022-02-21 LAB — CBC WITH DIFFERENTIAL/PLATELET
Abs Immature Granulocytes: 0.01 10*3/uL (ref 0.00–0.07)
Basophils Absolute: 0 10*3/uL (ref 0.0–0.1)
Basophils Relative: 0 %
Eosinophils Absolute: 0 10*3/uL (ref 0.0–0.5)
Eosinophils Relative: 0 %
HCT: 25.4 % — ABNORMAL LOW (ref 39.0–52.0)
Hemoglobin: 8.3 g/dL — ABNORMAL LOW (ref 13.0–17.0)
Immature Granulocytes: 0 %
Lymphocytes Relative: 35 %
Lymphs Abs: 1 10*3/uL (ref 0.7–4.0)
MCH: 33.7 pg (ref 26.0–34.0)
MCHC: 32.7 g/dL (ref 30.0–36.0)
MCV: 103.3 fL — ABNORMAL HIGH (ref 80.0–100.0)
Monocytes Absolute: 0.1 10*3/uL (ref 0.1–1.0)
Monocytes Relative: 5 %
Neutro Abs: 1.7 10*3/uL (ref 1.7–7.7)
Neutrophils Relative %: 60 %
Platelets: 87 10*3/uL — ABNORMAL LOW (ref 150–400)
RBC: 2.46 MIL/uL — ABNORMAL LOW (ref 4.22–5.81)
RDW: 25.3 % — ABNORMAL HIGH (ref 11.5–15.5)
WBC: 2.8 10*3/uL — ABNORMAL LOW (ref 4.0–10.5)
nRBC: 0 % (ref 0.0–0.2)

## 2022-02-21 MED ORDER — SODIUM CHLORIDE 0.9% FLUSH
10.0000 mL | Freq: Once | INTRAVENOUS | Status: AC
  Administered 2022-02-21: 10 mL via INTRAVENOUS

## 2022-02-22 ENCOUNTER — Inpatient Hospital Stay: Payer: Medicare Other

## 2022-02-22 ENCOUNTER — Telehealth: Payer: Self-pay

## 2022-02-22 NOTE — Telephone Encounter (Signed)
10 am.  Phone call made to residence and spoke with wife.  Follow up visit scheduled for next Tuesday.

## 2022-02-26 ENCOUNTER — Other Ambulatory Visit: Payer: Self-pay

## 2022-02-26 VITALS — BP 150/68 | HR 64 | Temp 97.6°F

## 2022-02-26 DIAGNOSIS — Z515 Encounter for palliative care: Secondary | ICD-10-CM

## 2022-02-26 NOTE — Progress Notes (Signed)
PATIENT NAME: Dale [name] DOB: 07-02-1928 MRN: [id number]  PRIMARY CARE PROVIDER: Shawn Stall, NP  RESPONSIBLE PARTY:  Acct ID - Guarantor Home Phone Work Phone Relationship Acct Type  [id number] Dale [name]* [phone]  Self P/F     [address] [place], [place], Dale [name] [address]    Follow up visit completed at the request of Dale Gusler, NP.  Met with patient and wife in their home.  ACP:  wife endorses DNR is already in place.  Form on the fridge. We discussed AD and MOST.  Wife advised she has completed this but patient did not wish to complete the forms.  Today, he is open to further discussion and would like paperwork.  Advised that I would mail documents to them this week.  I have include "Hard Choices" book in packet.  Functional Status:  Patient states he is making his own breakfast, completely ADL's, using riding lawnmower to complete yard work.  At this time, he does not feel additional assistance is needed in the home.  Resources:  Wife endorse son is very involved with patient care and takes him to all appointments.  They have a hired caregiver who comes in several times a week to assist with household chores, etc.  Patient is currently receiving meals through the [employer] program.  He is very pleased with this and states he enjoys the meals.  Plan:  Continue to monitor patient for symptom management needs and follow up with ACP planning.  CODE STATUS: DNR-form in home. ADVANCED DIRECTIVES: N MOST FORM: No PPS: 50%   PHYSICAL EXAM:   VITALS: Today's Vitals   02/26/22 1054  BP: (!) 150/68  Pulse: 64  Temp: 97.6 F (36.4 C)  SpO2: 99%    LUNGS: clear to auscultation  CARDIAC: Cor RRR}  EXTREMITIES: - for edema SKIN: Skin color, texture, turgor normal. No rashes or lesions or normal  NEURO: positive for gait problems       Lorenza Burton, RN

## 2022-02-28 ENCOUNTER — Ambulatory Visit: Payer: Medicare Other

## 2022-02-28 ENCOUNTER — Inpatient Hospital Stay: Payer: Medicare Other

## 2022-02-28 ENCOUNTER — Other Ambulatory Visit: Payer: Medicare Other

## 2022-02-28 VITALS — BP 153/67 | HR 84 | Temp 97.5°F | Resp 18

## 2022-02-28 DIAGNOSIS — Z79899 Other long term (current) drug therapy: Secondary | ICD-10-CM | POA: Diagnosis not present

## 2022-02-28 DIAGNOSIS — C9000 Multiple myeloma not having achieved remission: Secondary | ICD-10-CM | POA: Diagnosis not present

## 2022-02-28 DIAGNOSIS — Z5112 Encounter for antineoplastic immunotherapy: Secondary | ICD-10-CM | POA: Diagnosis not present

## 2022-02-28 DIAGNOSIS — D6481 Anemia due to antineoplastic chemotherapy: Secondary | ICD-10-CM | POA: Diagnosis not present

## 2022-02-28 LAB — MAGNESIUM: Magnesium: 2.1 mg/dL (ref 1.7–2.4)

## 2022-02-28 LAB — CBC WITH DIFFERENTIAL/PLATELET
Abs Immature Granulocytes: 0.01 K/uL (ref 0.00–0.07)
Basophils Absolute: 0 K/uL (ref 0.0–0.1)
Basophils Relative: 0 %
Eosinophils Absolute: 0 K/uL (ref 0.0–0.5)
Eosinophils Relative: 1 %
HCT: 22.2 % — ABNORMAL LOW (ref 39.0–52.0)
Hemoglobin: 7.3 g/dL — ABNORMAL LOW (ref 13.0–17.0)
Immature Granulocytes: 0 %
Lymphocytes Relative: 30 %
Lymphs Abs: 1 K/uL (ref 0.7–4.0)
MCH: 34.1 pg — ABNORMAL HIGH (ref 26.0–34.0)
MCHC: 32.9 g/dL (ref 30.0–36.0)
MCV: 103.7 fL — ABNORMAL HIGH (ref 80.0–100.0)
Monocytes Absolute: 0.2 K/uL (ref 0.1–1.0)
Monocytes Relative: 7 %
Neutro Abs: 2.1 K/uL (ref 1.7–7.7)
Neutrophils Relative %: 62 %
Platelets: 88 K/uL — ABNORMAL LOW (ref 150–400)
RBC: 2.14 MIL/uL — ABNORMAL LOW (ref 4.22–5.81)
RDW: 25 % — ABNORMAL HIGH (ref 11.5–15.5)
WBC: 3.3 K/uL — ABNORMAL LOW (ref 4.0–10.5)
nRBC: 0 % (ref 0.0–0.2)

## 2022-02-28 LAB — COMPREHENSIVE METABOLIC PANEL WITH GFR
ALT: 21 U/L (ref 0–44)
AST: 19 U/L (ref 15–41)
Albumin: 2.6 g/dL — ABNORMAL LOW (ref 3.5–5.0)
Alkaline Phosphatase: 57 U/L (ref 38–126)
Anion gap: 3 — ABNORMAL LOW (ref 5–15)
BUN: 20 mg/dL (ref 8–23)
CO2: 25 mmol/L (ref 22–32)
Calcium: 8.5 mg/dL — ABNORMAL LOW (ref 8.9–10.3)
Chloride: 107 mmol/L (ref 98–111)
Creatinine, Ser: 0.83 mg/dL (ref 0.61–1.24)
GFR, Estimated: >60 mL/min (ref >60)
Glucose, Bld: 114 mg/dL — ABNORMAL HIGH (ref 70–99)
Potassium: 3.8 mmol/L (ref 3.5–5.1)
Sodium: 135 mmol/L (ref 135–145)
Total Bilirubin: 0.7 mg/dL (ref 0.3–1.2)
Total Protein: 8.7 g/dL — ABNORMAL HIGH (ref 6.5–8.1)

## 2022-02-28 LAB — SAMPLE TO BLOOD BANK

## 2022-02-28 LAB — PREPARE RBC (CROSSMATCH)

## 2022-02-28 MED ORDER — HEPARIN SOD (PORK) LOCK FLUSH 100 UNIT/ML IV SOLN
500.0000 [IU] | Freq: Once | INTRAVENOUS | Status: AC | PRN
  Administered 2022-02-28: 500 [IU]

## 2022-02-28 MED ORDER — SODIUM CHLORIDE 0.9% FLUSH
10.0000 mL | INTRAVENOUS | Status: DC | PRN
  Administered 2022-02-28: 10 mL

## 2022-02-28 MED ORDER — BORTEZOMIB CHEMO SQ INJECTION 3.5 MG (2.5MG/ML)
1.0000 mg/m2 | Freq: Once | INTRAMUSCULAR | Status: AC
  Administered 2022-02-28: 1.75 mg via SUBCUTANEOUS
  Filled 2022-02-28: qty 0.7

## 2022-02-28 MED ORDER — SODIUM CHLORIDE 0.9 % IV SOLN: Freq: Once | INTRAVENOUS | Status: AC

## 2022-02-28 MED ORDER — METHYLPREDNISOLONE SODIUM SUCC 125 MG IJ SOLR
100.0000 mg | Freq: Once | INTRAMUSCULAR | Status: AC
  Administered 2022-02-28: 100 mg via INTRAVENOUS
  Filled 2022-02-28: qty 2

## 2022-02-28 MED ORDER — ACETAMINOPHEN 325 MG PO TABS
650.0000 mg | ORAL_TABLET | Freq: Once | ORAL | Status: AC
  Administered 2022-02-28: 650 mg via ORAL
  Filled 2022-02-28: qty 2

## 2022-02-28 MED ORDER — ONDANSETRON HCL 4 MG/2ML IJ SOLN
8.0000 mg | Freq: Once | INTRAMUSCULAR | Status: AC
  Administered 2022-02-28: 8 mg via INTRAVENOUS
  Filled 2022-02-28: qty 4

## 2022-02-28 MED ORDER — DIPHENHYDRAMINE HCL 25 MG PO CAPS
25.0000 mg | ORAL_CAPSULE | Freq: Once | ORAL | Status: AC
  Administered 2022-02-28: 25 mg via ORAL
  Filled 2022-02-28: qty 1

## 2022-02-28 MED ORDER — SODIUM CHLORIDE 0.9 % IV SOLN
16.0000 mg/kg | Freq: Once | INTRAVENOUS | Status: AC
  Administered 2022-02-28: 900 mg via INTRAVENOUS
  Filled 2022-02-28: qty 40

## 2022-02-28 NOTE — Patient Instructions (Signed)
Dale [name]  Discharge Instructions: Thank you for choosing [facility] to provide your oncology and hematology care.  If you have a lab appointment with the [facility], please come in thru the Main Entrance and check in at the main information desk.  Wear comfortable clothing and clothing appropriate for easy access to any Portacath or PICC line.   We strive to give you quality time with your provider. You may need to reschedule your appointment if you arrive late (15 or more minutes).  Arriving late affects you and other patients whose appointments are after yours.  Also, if you miss three or more appointments without notifying the office, you may be dismissed from the clinic at the provider's discretion.      For prescription refill requests, have your pharmacy contact our office and allow 72 hours for refills to be completed.    Today you received the following chemotherapy and/or immunotherapy agents Daratumumab.  Daratumumab Injection What is this medication? DARATUMUMAB (dar a toom ue mab) treats multiple myeloma, a type of bone marrow cancer. It works by helping your immune system slow or stop the spread of cancer cells. It is a monoclonal antibody. This medicine may be used for other purposes; ask your health care provider or pharmacist if you have questions. COMMON BRAND NAME(S): DARZALEX What should I tell my care team before I take this medication? They need to know if you have any of these conditions: Hereditary fructose intolerance Infection, such as chickenpox, herpes, hepatitis B virus Lung or breathing disease, such as asthma, COPD An unusual or allergic reaction to daratumumab, sorbitol, other medications, foods, dyes, or preservatives Pregnant or trying to get pregnant Breast-feeding How should I use this medication? This medication is injected into a vein. It is given by your care team in a hospital or clinic setting. Talk to your  care team about the use of this medication in children. Special care may be needed. Overdosage: If you think you have taken too much of this medicine contact a poison control center or emergency room at once. NOTE: This medicine is only for you. Do not share this medicine with others. What if I miss a dose? Keep appointments for follow-up doses. It is important not to miss your dose. Call your care team if you are unable to keep an appointment. What may interact with this medication? Interactions have not been studied. This list may not describe all possible interactions. Give your health care provider a list of all the medicines, herbs, non-prescription drugs, or dietary supplements you use. Also tell them if you smoke, drink alcohol, or use illegal drugs. Some items may interact with your medicine. What should I watch for while using this medication? Your condition will be monitored carefully while you are receiving this medication. This medication can cause serious allergic reactions. To reduce your risk, your care team may give you other medication to take before receiving this one. Be sure to follow the directions from your care team. This medication can affect the results of blood tests to match your blood type. These changes can last for up to 6 months after the final dose. Your care team will do blood tests to match your blood type before you start treatment. Tell all of your care team that you are being treated with this medication before receiving a blood transfusion. This medication can affect the results of some tests used to determine treatment response; extra tests may be needed  to evaluate response. Talk to your care team if you wish to become pregnant or think you are pregnant. This medication can cause serious birth defects if taken during pregnancy and for 3 months after the last dose. A reliable form of contraception is recommended while taking this medication and for 3 months after  the last dose. Talk to your care team about effective forms of contraception. Do not breast-feed while taking this medication. What side effects may I notice from receiving this medication? Side effects that you should report to your care team as soon as possible: Allergic reactions--skin rash, itching, hives, swelling of the face, lips, tongue, or throat Infection--fever, chills, cough, sore throat, wounds that don't heal, pain or trouble when passing urine, general feeling of discomfort or being unwell Infusion reactions--chest pain, shortness of breath or trouble breathing, feeling faint or lightheaded Unusual bruising or bleeding Side effects that usually do not require medical attention (report to your care team if they continue or are bothersome): Constipation Diarrhea Fatigue Nausea Pain, tingling, or numbness in the hands or feet Swelling of the ankles, hands, or feet This list may not describe all possible side effects. Call your doctor for medical advice about side effects. You may report side effects to FDA at 1-800-FDA-1088. Where should I keep my medication? This medication is given in a hospital or clinic. It will not be stored at home. NOTE: This sheet is a summary. It may not cover all possible information. If you have questions about this medicine, talk to your doctor, pharmacist, or health care provider.  2023 Elsevier/Gold Standard (2021-08-15 00:00:00)        To help prevent nausea and vomiting after your treatment, we encourage you to take your nausea medication as directed.  BELOW ARE SYMPTOMS THAT SHOULD BE REPORTED IMMEDIATELY: *FEVER GREATER THAN 100.4 F (38 C) OR HIGHER *CHILLS OR SWEATING *NAUSEA AND VOMITING THAT IS NOT CONTROLLED WITH YOUR NAUSEA MEDICATION *UNUSUAL SHORTNESS OF BREATH *UNUSUAL BRUISING OR BLEEDING *URINARY PROBLEMS (pain or burning when urinating, or frequent urination) *BOWEL PROBLEMS (unusual diarrhea, constipation, pain near the  anus) TENDERNESS IN MOUTH AND THROAT WITH OR WITHOUT PRESENCE OF ULCERS (sore throat, sores in mouth, or a toothache) UNUSUAL RASH, SWELLING OR PAIN  UNUSUAL VAGINAL DISCHARGE OR ITCHING   Items with * indicate a potential emergency and should be followed up as soon as possible or go to the Emergency Department if any problems should occur.  Please show the CHEMOTHERAPY ALERT CARD or IMMUNOTHERAPY ALERT CARD at check-in to the Emergency Department and triage nurse.  Should you have questions after your visit or need to cancel or reschedule your appointment, please contact [facility] [phone]  and follow the prompts.  Office hours are 8:00 a.m. to 4:30 p.m. Monday - Friday. Please note that voicemails left after 4:00 p.m. may not be returned until the following business day.  We are closed weekends and major holidays. You have access to a nurse at all times for urgent questions. Please call the main number to the clinic [phone] and follow the prompts.  For any non-urgent questions, you may also contact your provider using MyChart. We now offer e-Visits for anyone 42 and older to request care online for non-urgent symptoms. For details visit [email or web address].   Also download the MyChart app! Go to the app store, search "MyChart", open the app, select [facility], and log in with your MyChart username and password.  Masks are optional in the cancer  centers. If you would like for your care team to wear a mask while they are taking care of you, please let them know. You may have one support person who is at least 86 years old accompany you for your appointments.

## 2022-02-28 NOTE — Progress Notes (Signed)
Orders are in for one unit of blood per MD.

## 2022-02-28 NOTE — Progress Notes (Signed)
Ok to treat with today's lab values.  Patient to receive  1 unit of blood as well.  Received orders for treatment this week then repeat in 2 weeks then MD will determine future frequency of treatments.  Decrease diphenhydramine to 25 mg po x 1 as premedication due to age and dizziness patient is experiencing.  T.O. Dr Rhys Martini, PharmD

## 2022-02-28 NOTE — Progress Notes (Signed)
Patient presents today for IV Dartumumab and Velcade injection.  Patient is in satisfactory condition with only complaints of dizziness.  Vital signs are stable.  Labs reviewed.  Hemoglobin today is 7.3 and platelets are 88.  MD made aware. We will proceed with treatment today per MD orders and give one unit of PRBC tomorrow due to antibodies.   Patient tolerated Velcade injection with no complaints voiced.  Site clean and dry with no bruising or swelling noted.  No complaints of pain.    Patient tolerated treatment well with no complaints voiced.  Patient left ambulatory in stable condition.  Vital signs stable at discharge.  Follow up as scheduled.

## 2022-03-01 ENCOUNTER — Inpatient Hospital Stay: Payer: Medicare Other

## 2022-03-01 DIAGNOSIS — D6481 Anemia due to antineoplastic chemotherapy: Secondary | ICD-10-CM | POA: Diagnosis not present

## 2022-03-01 DIAGNOSIS — Z5112 Encounter for antineoplastic immunotherapy: Secondary | ICD-10-CM | POA: Diagnosis not present

## 2022-03-01 DIAGNOSIS — C9000 Multiple myeloma not having achieved remission: Secondary | ICD-10-CM | POA: Diagnosis not present

## 2022-03-01 DIAGNOSIS — Z79899 Other long term (current) drug therapy: Secondary | ICD-10-CM | POA: Diagnosis not present

## 2022-03-01 LAB — BPAM RBC
Blood Product Expiration Date: 202311242359
Unit Type and Rh: 5100

## 2022-03-01 MED ORDER — SODIUM CHLORIDE 0.9% IV SOLUTION
250.0000 mL | Freq: Once | INTRAVENOUS | Status: AC
  Administered 2022-03-01: 250 mL via INTRAVENOUS

## 2022-03-01 MED ORDER — SODIUM CHLORIDE 0.9% FLUSH
10.0000 mL | INTRAVENOUS | Status: AC
  Administered 2022-03-01: 10 mL

## 2022-03-01 MED ORDER — SODIUM CHLORIDE 0.9% FLUSH
10.0000 mL | INTRAVENOUS | Status: AC | PRN
  Administered 2022-03-01: 10 mL

## 2022-03-01 MED ORDER — ACETAMINOPHEN 325 MG PO TABS
650.0000 mg | ORAL_TABLET | Freq: Once | ORAL | Status: AC
  Administered 2022-03-01: 650 mg via ORAL
  Filled 2022-03-01: qty 2

## 2022-03-01 MED ORDER — DIPHENHYDRAMINE HCL 25 MG PO CAPS
25.0000 mg | ORAL_CAPSULE | Freq: Once | ORAL | Status: AC
  Administered 2022-03-01: 25 mg via ORAL
  Filled 2022-03-01: qty 1

## 2022-03-01 MED ORDER — HEPARIN SOD (PORK) LOCK FLUSH 100 UNIT/ML IV SOLN
500.0000 [IU] | Freq: Every day | INTRAVENOUS | Status: AC | PRN
  Administered 2022-03-01: 500 [IU]

## 2022-03-01 NOTE — Progress Notes (Signed)
Patient presents today for 1 unit of PRBC's. MAR reviewed and updated. Patient denies any shortness of breath or dizziness. Patient had no complaints today.   1 unit of PRBC's given today per MD orders. Tolerated infusion without adverse affects. Vital signs stable. No complaints at this time. Discharged from clinic ambulatory in stable condition. Alert and oriented x 3. F/U with [facility] as scheduled.

## 2022-03-01 NOTE — Patient Instructions (Signed)
[facility]  Discharge Instructions: Thank you for choosing [facility] to provide your oncology and hematology care.  If you have a lab appointment with the [facility], please come in thru the Main Entrance and check in at the main information desk.  Wear comfortable clothing and clothing appropriate for easy access to any Portacath or PICC line.   We strive to give you quality time with your provider. You may need to reschedule your appointment if you arrive late (15 or more minutes).  Arriving late affects you and other patients whose appointments are after yours.  Also, if you miss three or more appointments without notifying the office, you may be dismissed from the clinic at the provider's discretion.      For prescription refill requests, have your pharmacy contact our office and allow 72 hours for refills to be completed.    Today you received the following chemotherapy and/or immunotherapy agents 1 unit of PRBC's.  Blood Transfusion, Adult, Care After The following information offers guidance on how to care for yourself after your procedure. Your health care provider may also give you more specific instructions. If you have problems or questions, contact your health care provider. What can I expect after the procedure? After the procedure, it is common to have: Bruising and soreness where the IV was inserted. A headache. Follow these instructions at home: IV insertion site care     Follow instructions from your health care provider about how to take care of your IV insertion site. Make sure you: Wash your hands with soap and water for at least 20 seconds before and after you change your bandage (dressing). If soap and water are not available, use hand sanitizer. Change your dressing as told by your health care provider. Check your IV insertion site every day for signs of infection. Check for: Redness, swelling, or pain. Bleeding from the  site. Warmth. Pus or a bad smell. General instructions Take over-the-counter and prescription medicines only as told by your health care provider. Rest as told by your health care provider. Return to your normal activities as told by your health care provider. Keep all follow-up visits. Lab tests may need to be done at certain periods to recheck your blood counts. Contact a health care provider if: You have itching or red, swollen areas of skin (hives). You have a fever or chills. You have pain in the head, back, or chest. You feel anxious or you feel weak after doing your normal activities. You have redness, swelling, warmth, or pain around the IV insertion site. You have blood coming from the IV insertion site that does not stop with pressure. You have pus or a bad smell coming from your IV insertion site. If you received your blood transfusion in an outpatient setting, you will be told whom to contact to report any reactions. Get help right away if: You have symptoms of a serious allergic or immune system reaction, including: Trouble breathing or shortness of breath. Swelling of the face, feeling flushed, or widespread rash. Dark urine or blood in the urine. Fast heartbeat. These symptoms may be an emergency. Get help right away. Call 911. Do not wait to see if the symptoms will go away. Do not drive yourself to the hospital. Summary Bruising and soreness around the IV insertion site are common. Check your IV insertion site every day for signs of infection. Rest as told by your health care provider. Return to your normal  activities as told by your health care provider. Get help right away for symptoms of a serious allergic or immune system reaction to the blood transfusion. This information is not intended to replace advice given to you by your health care provider. Make sure you discuss any questions you have with your health care provider. Document Revised: 07/20/2021 Document  Reviewed: 07/20/2021 Elsevier Patient Education  [employer].       To help prevent nausea and vomiting after your treatment, we encourage you to take your nausea medication as directed.  BELOW ARE SYMPTOMS THAT SHOULD BE REPORTED IMMEDIATELY: *FEVER GREATER THAN 100.4 F (38 C) OR HIGHER *CHILLS OR SWEATING *NAUSEA AND VOMITING THAT IS NOT CONTROLLED WITH YOUR NAUSEA MEDICATION *UNUSUAL SHORTNESS OF BREATH *UNUSUAL BRUISING OR BLEEDING *URINARY PROBLEMS (pain or burning when urinating, or frequent urination) *BOWEL PROBLEMS (unusual diarrhea, constipation, pain near the anus) TENDERNESS IN MOUTH AND THROAT WITH OR WITHOUT PRESENCE OF ULCERS (sore throat, sores in mouth, or a toothache) UNUSUAL RASH, SWELLING OR PAIN  UNUSUAL VAGINAL DISCHARGE OR ITCHING   Items with * indicate a potential emergency and should be followed up as soon as possible or go to the Emergency Department if any problems should occur.  Please show the CHEMOTHERAPY ALERT CARD or IMMUNOTHERAPY ALERT CARD at check-in to the Emergency Department and triage nurse.  Should you have questions after your visit or need to cancel or reschedule your appointment, please contact [facility] [phone]  and follow the prompts.  Office hours are 8:00 a.m. to 4:30 p.m. Monday - Friday. Please note that voicemails left after 4:00 p.m. may not be returned until the following business day.  We are closed weekends and major holidays. You have access to a nurse at all times for urgent questions. Please call the main number to the clinic [phone] and follow the prompts.  For any non-urgent questions, you may also contact your provider using MyChart. We now offer e-Visits for anyone 11 and older to request care online for non-urgent symptoms. For details visit [email or web address].   Also download the MyChart app! Go to the app store, search "MyChart", open the app, select [facility], and log in with  your MyChart username and password.  Masks are optional in the cancer centers. If you would like for your care team to wear a mask while they are taking care of you, please let them know. You may have one support person who is at least 87 years old accompany you for your appointments.

## 2022-03-02 LAB — TYPE AND SCREEN
ABO/RH(D): O POS
Antibody Screen: POSITIVE
DAT, IgG: NEGATIVE
Unit division: 0

## 2022-03-05 DIAGNOSIS — T451X5D Adverse effect of antineoplastic and immunosuppressive drugs, subsequent encounter: Secondary | ICD-10-CM | POA: Diagnosis not present

## 2022-03-05 DIAGNOSIS — D6481 Anemia due to antineoplastic chemotherapy: Secondary | ICD-10-CM | POA: Diagnosis not present

## 2022-03-05 DIAGNOSIS — D509 Iron deficiency anemia, unspecified: Secondary | ICD-10-CM | POA: Diagnosis not present

## 2022-03-05 DIAGNOSIS — I509 Heart failure, unspecified: Secondary | ICD-10-CM | POA: Diagnosis not present

## 2022-03-05 DIAGNOSIS — C9000 Multiple myeloma not having achieved remission: Secondary | ICD-10-CM | POA: Diagnosis not present

## 2022-03-05 DIAGNOSIS — I110 Hypertensive heart disease with heart failure: Secondary | ICD-10-CM | POA: Diagnosis not present

## 2022-03-07 ENCOUNTER — Other Ambulatory Visit: Payer: Medicare Other

## 2022-03-07 ENCOUNTER — Ambulatory Visit: Payer: Medicare Other

## 2022-03-13 DIAGNOSIS — T451X5D Adverse effect of antineoplastic and immunosuppressive drugs, subsequent encounter: Secondary | ICD-10-CM | POA: Diagnosis not present

## 2022-03-13 DIAGNOSIS — C9000 Multiple myeloma not having achieved remission: Secondary | ICD-10-CM | POA: Diagnosis not present

## 2022-03-13 DIAGNOSIS — I509 Heart failure, unspecified: Secondary | ICD-10-CM | POA: Diagnosis not present

## 2022-03-13 DIAGNOSIS — D509 Iron deficiency anemia, unspecified: Secondary | ICD-10-CM | POA: Diagnosis not present

## 2022-03-13 DIAGNOSIS — D6481 Anemia due to antineoplastic chemotherapy: Secondary | ICD-10-CM | POA: Diagnosis not present

## 2022-03-13 DIAGNOSIS — I110 Hypertensive heart disease with heart failure: Secondary | ICD-10-CM | POA: Diagnosis not present

## 2022-03-14 ENCOUNTER — Inpatient Hospital Stay: Payer: Medicare Other | Attending: Hematology

## 2022-03-14 ENCOUNTER — Inpatient Hospital Stay (HOSPITAL_BASED_OUTPATIENT_CLINIC_OR_DEPARTMENT_OTHER): Payer: Medicare Other | Admitting: Hematology

## 2022-03-14 ENCOUNTER — Inpatient Hospital Stay: Payer: Medicare Other

## 2022-03-14 VITALS — BP 144/58 | HR 86 | Temp 97.2°F | Resp 18

## 2022-03-14 DIAGNOSIS — C9000 Multiple myeloma not having achieved remission: Secondary | ICD-10-CM

## 2022-03-14 DIAGNOSIS — Z79899 Other long term (current) drug therapy: Secondary | ICD-10-CM | POA: Insufficient documentation

## 2022-03-14 DIAGNOSIS — D630 Anemia in neoplastic disease: Secondary | ICD-10-CM | POA: Insufficient documentation

## 2022-03-14 DIAGNOSIS — Z5112 Encounter for antineoplastic immunotherapy: Secondary | ICD-10-CM | POA: Insufficient documentation

## 2022-03-14 LAB — CBC WITH DIFFERENTIAL/PLATELET
Abs Immature Granulocytes: 0.01 10*3/uL (ref 0.00–0.07)
Basophils Absolute: 0 10*3/uL (ref 0.0–0.1)
Basophils Relative: 0 %
Eosinophils Absolute: 0.1 10*3/uL (ref 0.0–0.5)
Eosinophils Relative: 2 %
HCT: 24.7 % — ABNORMAL LOW (ref 39.0–52.0)
Hemoglobin: 8.3 g/dL — ABNORMAL LOW (ref 13.0–17.0)
Immature Granulocytes: 0 %
Lymphocytes Relative: 26 %
Lymphs Abs: 0.9 10*3/uL (ref 0.7–4.0)
MCH: 35 pg — ABNORMAL HIGH (ref 26.0–34.0)
MCHC: 33.6 g/dL (ref 30.0–36.0)
MCV: 104.2 fL — ABNORMAL HIGH (ref 80.0–100.0)
Monocytes Absolute: 0.3 10*3/uL (ref 0.1–1.0)
Monocytes Relative: 7 %
Neutro Abs: 2.2 10*3/uL (ref 1.7–7.7)
Neutrophils Relative %: 65 %
Platelets: 99 10*3/uL — ABNORMAL LOW (ref 150–400)
RBC: 2.37 MIL/uL — ABNORMAL LOW (ref 4.22–5.81)
RDW: 23.7 % — ABNORMAL HIGH (ref 11.5–15.5)
WBC: 3.4 10*3/uL — ABNORMAL LOW (ref 4.0–10.5)
nRBC: 0 % (ref 0.0–0.2)

## 2022-03-14 LAB — COMPREHENSIVE METABOLIC PANEL
ALT: 28 U/L (ref 0–44)
AST: 21 U/L (ref 15–41)
Albumin: 2.7 g/dL — ABNORMAL LOW (ref 3.5–5.0)
Alkaline Phosphatase: 56 U/L (ref 38–126)
Anion gap: 3 — ABNORMAL LOW (ref 5–15)
BUN: 22 mg/dL (ref 8–23)
CO2: 24 mmol/L (ref 22–32)
Calcium: 8.7 mg/dL — ABNORMAL LOW (ref 8.9–10.3)
Chloride: 107 mmol/L (ref 98–111)
Creatinine, Ser: 0.85 mg/dL (ref 0.61–1.24)
GFR, Estimated: >60 mL/min (ref >60)
Glucose, Bld: 109 mg/dL — ABNORMAL HIGH (ref 70–99)
Potassium: 4 mmol/L (ref 3.5–5.1)
Sodium: 134 mmol/L — ABNORMAL LOW (ref 135–145)
Total Bilirubin: 0.7 mg/dL (ref 0.3–1.2)
Total Protein: 8.8 g/dL — ABNORMAL HIGH (ref 6.5–8.1)

## 2022-03-14 LAB — MAGNESIUM: Magnesium: 2.2 mg/dL (ref 1.7–2.4)

## 2022-03-14 MED ORDER — BORTEZOMIB CHEMO SQ INJECTION 3.5 MG (2.5MG/ML)
1.0000 mg/m2 | Freq: Once | INTRAMUSCULAR | Status: AC
  Administered 2022-03-14: 1.75 mg via SUBCUTANEOUS
  Filled 2022-03-14: qty 0.7

## 2022-03-14 MED ORDER — METHYLPREDNISOLONE SODIUM SUCC 125 MG IJ SOLR
100.0000 mg | Freq: Once | INTRAMUSCULAR | Status: AC
  Administered 2022-03-14: 100 mg via INTRAVENOUS
  Filled 2022-03-14: qty 2

## 2022-03-14 MED ORDER — ONDANSETRON HCL 4 MG/2ML IJ SOLN
8.0000 mg | Freq: Once | INTRAMUSCULAR | Status: AC
  Administered 2022-03-14: 8 mg via INTRAVENOUS
  Filled 2022-03-14: qty 4

## 2022-03-14 MED ORDER — SODIUM CHLORIDE 0.9% FLUSH
10.0000 mL | INTRAVENOUS | Status: DC | PRN
  Administered 2022-03-14: 10 mL

## 2022-03-14 MED ORDER — SODIUM CHLORIDE 0.9 % IV SOLN
16.0000 mg/kg | Freq: Once | INTRAVENOUS | Status: AC
  Administered 2022-03-14: 900 mg via INTRAVENOUS
  Filled 2022-03-14: qty 40

## 2022-03-14 MED ORDER — SODIUM CHLORIDE 0.9 % IV SOLN: Freq: Once | INTRAVENOUS | Status: AC

## 2022-03-14 MED ORDER — ACETAMINOPHEN 325 MG PO TABS
650.0000 mg | ORAL_TABLET | Freq: Once | ORAL | Status: AC
  Administered 2022-03-14: 650 mg via ORAL
  Filled 2022-03-14: qty 2

## 2022-03-14 MED ORDER — HEPARIN SOD (PORK) LOCK FLUSH 100 UNIT/ML IV SOLN
500.0000 [IU] | Freq: Once | INTRAVENOUS | Status: AC | PRN
  Administered 2022-03-14: 500 [IU]

## 2022-03-14 MED ORDER — DIPHENHYDRAMINE HCL 25 MG PO CAPS
25.0000 mg | ORAL_CAPSULE | Freq: Once | ORAL | Status: AC
  Administered 2022-03-14: 25 mg via ORAL
  Filled 2022-03-14: qty 1

## 2022-03-14 NOTE — Progress Notes (Signed)
Patient presents today for Darzalex and Velcade per providers order.  Vital sign within parameters for treatment.  Labs pending.  Patient has no new complaints at this time.   MD reviewed labs.  Message received from Anastasio Champion RN/Dr. Delton Coombes, patient okay for treatment. Treatment given today per MD orders.  Stable during infusion and injection without adverse affects.  See MAR for injection site.  Vital signs stable.  No complaints at this time.  Discharge from clinic ambulatory in stable condition.  Alert and oriented X 3.  Follow up with [facility] as scheduled.

## 2022-03-14 NOTE — Patient Instructions (Addendum)
[facility]  Discharge Instructions: Thank you for choosing [facility] to provide your oncology and hematology care.  If you have a lab appointment with the [facility], please come in thru the Main Entrance and check in at the main information desk.  Wear comfortable clothing and clothing appropriate for easy access to any Portacath or PICC line.   We strive to give you quality time with your provider. You may need to reschedule your appointment if you arrive late (15 or more minutes).  Arriving late affects you and other patients whose appointments are after yours.  Also, if you miss three or more appointments without notifying the office, you may be dismissed from the clinic at the provider's discretion.      For prescription refill requests, have your pharmacy contact our office and allow 72 hours for refills to be completed.    Today you received the following chemotherapy and/or immunotherapy agents Velcade.Darzalex      To help prevent nausea and vomiting after your treatment, we encourage you to take your nausea medication as directed.  BELOW ARE SYMPTOMS THAT SHOULD BE REPORTED IMMEDIATELY: *FEVER GREATER THAN 100.4 F (38 C) OR HIGHER *CHILLS OR SWEATING *NAUSEA AND VOMITING THAT IS NOT CONTROLLED WITH YOUR NAUSEA MEDICATION *UNUSUAL SHORTNESS OF BREATH *UNUSUAL BRUISING OR BLEEDING *URINARY PROBLEMS (pain or burning when urinating, or frequent urination) *BOWEL PROBLEMS (unusual diarrhea, constipation, pain near the anus) TENDERNESS IN MOUTH AND THROAT WITH OR WITHOUT PRESENCE OF ULCERS (sore throat, sores in mouth, or a toothache) UNUSUAL RASH, SWELLING OR PAIN  UNUSUAL VAGINAL DISCHARGE OR ITCHING   Items with * indicate a potential emergency and should be followed up as soon as possible or go to the Emergency Department if any problems should occur.  Please show the CHEMOTHERAPY ALERT CARD or IMMUNOTHERAPY ALERT CARD at check-in to the  Emergency Department and triage nurse.  Should you have questions after your visit or need to cancel or reschedule your appointment, please contact [facility] [phone]  and follow the prompts.  Office hours are 8:00 a.m. to 4:30 p.m. Monday - Friday. Please note that voicemails left after 4:00 p.m. may not be returned until the following business day.  We are closed weekends and major holidays. You have access to a nurse at all times for urgent questions. Please call the main number to the clinic [phone] and follow the prompts.  For any non-urgent questions, you may also contact your provider using MyChart. We now offer e-Visits for anyone 78 and older to request care online for non-urgent symptoms. For details visit [email or web address].   Also download the MyChart app! Go to the app store, search "MyChart", open the app, select [facility], and log in with your MyChart username and password.  Masks are optional in the cancer centers. If you would like for your care team to wear a mask while they are taking care of you, please let them know. You may have one support person who is at least 86 years old accompany you for your appointments.

## 2022-03-14 NOTE — Patient Instructions (Signed)
[facility] at [facility] Discharge Instructions   You were seen and examined today by Dr. Delton Coombes.  He reviewed the results of your lab work which are stable.   We will proceed with your treatment today.   We will continue every other week treatments.   Return as scheduled.    Thank you for choosing [facility] at [facility] to provide your oncology and hematology care.  To afford each patient quality time with our provider, please arrive at least 15 minutes before your scheduled appointment time.   If you have a lab appointment with the [facility] please come in thru the Main Entrance and check in at the main information desk.  You need to re-schedule your appointment should you arrive 10 or more minutes late.  We strive to give you quality time with our providers, and arriving late affects you and other patients whose appointments are after yours.  Also, if you no show three or more times for appointments you may be dismissed from the clinic at the providers discretion.     Again, thank you for choosing [facility].  Our hope is that these requests will decrease the amount of time that you wait before being seen by our physicians.       _____________________________________________________________  Should you have questions after your visit to [facility], please contact our office at [phone] and follow the prompts.  Our office hours are 8:00 a.m. and 4:30 p.m. Monday - Friday.  Please note that voicemails left after 4:00 p.m. may not be returned until the following business day.  We are closed weekends and major holidays.  You do have access to a nurse 24-7, just call the main number to the clinic [phone] and do not press any options, hold on the line and a nurse will answer the phone.    For prescription refill requests, have your pharmacy contact our office and allow 72 hours.    Due to Covid,  you will need to wear a mask upon entering the hospital. If you do not have a mask, a mask will be given to you at the Main Entrance upon arrival. For doctor visits, patients may have 1 support person age 68 or older with them. For treatment visits, patients can not have anyone with them due to social distancing guidelines and our immunocompromised population.

## 2022-03-14 NOTE — Progress Notes (Signed)
Dale [name], [place] [address]   CLINIC:  Medical Oncology/Hematology  PCP:  Shawn Stall, NP [address] [place] [address] [phone]   REASON FOR VISIT:  Follow-up for multiple myeloma and anemia  PRIOR THERAPY: none  CURRENT THERAPY: Weekly Velcade and dexamethasone  BRIEF ONCOLOGIC HISTORY:  Oncology History  Multiple myeloma ([facility])  01/15/2021 Initial Diagnosis   Multiple myeloma ([facility])   06/25/2021 - 12/06/2021 Chemotherapy   Patient is on Treatment Plan : MYELOMA NON-TRANSPLANT CANDIDATES VRd weekly q21d     12/20/2021 -  Chemotherapy   Patient is on Treatment Plan : MYELOMA Daratumumab IV q28d       CANCER STAGING:  Cancer Staging  No matching staging information was found for the patient.  INTERVAL HISTORY:  [name], a 88 y.o. male, seen for follow-up of multiple myeloma.  In the last 4 weeks, he has denied any falls.  He denied any ER visits or hospitalizations.  He has constipation alternating with diarrhea.  Occasional dizziness was reported.  He is not taking Lasix daily.  REVIEW OF SYSTEMS:  Review of Systems  Gastrointestinal:  Positive for constipation and diarrhea.  Neurological:  Positive for dizziness.  All other systems reviewed and are negative.   PAST MEDICAL/SURGICAL HISTORY:  Past Medical History:  Diagnosis Date   Allergic rhinitis    Anal fissure    Anginal pain (HCC)    Asthma    Cancer (HCC)    skin   Chest pain    CHF (congestive heart failure) ([facility])    Coronary heart disease    s/p stenting. cath in 01/2012 noncritical occlusion   Dyspnea    Dysrhythmia    1st degree heart block   GERD (gastroesophageal reflux disease)    Glaucoma    Hiatal hernia    Hyperlipidemia    Hypertension    Hypothyroidism    Idiopathic thrombocytopenic purpura ([facility]) 2002   Macular degeneration    Nephrolithiasis    PUD (peptic ulcer disease)    remote   Sarcoidosis    pulmonary    Schatzki's ring    Past Surgical History:  Procedure Laterality Date   cardiac stents     COLONOSCOPY  10/30/2006   Normal rectum, sigmoid diverticula.Remainder of colonic mucosa appeared normal.   CORONARY ANGIOPLASTY WITH STENT PLACEMENT     about 10 years ago per pt (around 2007)   [place], URETEROSCOPY AND STENT PLACEMENT Left 06/16/2017   Procedure: CYSTOSCOPY WITH RETROGRADE PYELOGRAM, URETEROSCOPY,STONE EXTRACTION  AND STENT PLACEMENT;  Surgeon: Franchot Gallo, MD;  Location: [facility];  Service: Urology;  Laterality: Left;   ESOPHAGOGASTRODUODENOSCOPY  06/19/2004   Two esophageal rings and esophageal web as described above.  All of these were disrupted by passing 56-French Venia Minks dilator/ Candida esophagitis,which appears to be incidental given history of   antibiotic use, but nevertheless will be treated.   ESOPHAGOGASTRODUODENOSCOPY  10/30/2006   Distal tandem esophageal ring status post dilation disruption as  described above.  Otherwise normal esophagus/  Small hiatal hernia otherwise normal stomach, D1 and D2   ESOPHAGOGASTRODUODENOSCOPY N/A 03/22/2015   Dr.Rourk- noncritical schatzki's ring and hiatal hernia-o/w normal EGD.    ESOPHAGOGASTRODUODENOSCOPY (EGD) WITH ESOPHAGEAL DILATION  03/04/2012   RMR- schatzki's ring, hiatal hernia, polypoid gastric mucosa, bx= minimally active gastritis.   HOLMIUM LASER APPLICATION Left 09/05/8880   Procedure: HOLMIUM LASER APPLICATION;  Surgeon: Franchot Gallo, MD;  Location:  [facility];  Service: Urology;  Laterality: Left;   IR CV LINE INJECTION  12/27/2021   IR GENERIC HISTORICAL  03/06/2016   IR RADIOLOGIST EVAL & MGMT 03/06/2016 Aletta Edouard, MD [facility] INTERV RAD   IR GENERIC HISTORICAL  06/18/2016   IR RADIOLOGIST EVAL & MGMT 06/18/2016 Aletta Edouard, MD [facility] INTERV RAD   IR IMAGING GUIDED PORT INSERTION  12/18/2021   IR IMAGING GUIDED PORT INSERTION  01/15/2022   IR RADIOLOGIST EVAL & MGMT  10/01/2016    IR RADIOLOGIST EVAL & MGMT  10/15/2017   IR RADIOLOGIST EVAL & MGMT  12/24/2018   IR RADIOLOGIST EVAL & MGMT  01/04/2020   IR RADIOLOGIST EVAL & MGMT  06/20/2021   IR REMOVAL TUN ACCESS W/ PORT W/O FL MOD SED  01/15/2022   LEFT HEART CATH N/A 02/02/2012   Procedure: LEFT HEART CATH;  Surgeon: Lorretta Harp, MD;  Location: [facility] CATH LAB;  Service: Cardiovascular;  Laterality: N/A;   MEDIASTINOSCOPY     for dx sarcoid   RADIOLOGY WITH ANESTHESIA Left 05/17/2016   Procedure: left renal ablation;  Surgeon: Aletta Edouard, MD;  Location: [facility];  Service: Radiology;  Laterality: Left;    SOCIAL HISTORY:  Social History   Socioeconomic History   Marital status: Married    Spouse name: Not on file   Number of children: 1   Years of education: Not on file   Highest education level: Not on file  Occupational History   Occupation: Retired    Comment: [employer]: RETIRED  Tobacco Use   Smoking status: Former    Packs/day: 0.10    Years: 2.00    Total pack years: 0.20    Types: Cigarettes, Cigars    Quit date: 05/06/1970    Years since quitting: 51.8   Smokeless tobacco: Never  Vaping Use   Vaping Use: Never used  Substance and Sexual Activity   Alcohol use: No    Alcohol/week: 0.0 standard drinks of alcohol   Drug use: No   Sexual activity: Never  Other Topics Concern   Not on file  Social History Narrative   Not on file   Social Determinants of Health   Financial Resource Strain: Not on file  Food Insecurity: No Food Insecurity (02/06/2022)   Hunger Vital Sign    Worried About Running Out of Food in the Last Year: Never true    Ran Out of Food in the Last Year: Never true  Transportation Needs: No Transportation Needs (02/06/2022)   PRAPARE - Hydrologist (Medical): No    Lack of Transportation (Non-Medical): No  Physical Activity: Not on file  Stress: Not on file  Social Connections: Not on file  Intimate Partner  Violence: Not At Risk (02/06/2022)   Humiliation, Afraid, Rape, and Kick questionnaire    Fear of Current or Ex-Partner: No    Emotionally Abused: No    Physically Abused: No    Sexually Abused: No    FAMILY HISTORY:  Family History  Problem Relation Age of Onset   Heart disease Father        deceased age 96   Stroke Mother    Alzheimer's disease Mother    Heart attack Brother        deceased age 73   Cancer Other        niece   Colon cancer Neg Hx     CURRENT MEDICATIONS:  Current  Outpatient Medications  Medication Sig Dispense Refill   acetaminophen (TYLENOL) 325 MG tablet Take 2 tablets (650 mg total) by mouth every 6 (six) hours as needed for mild pain (or Fever >/= 101).     acyclovir (ZOVIRAX) 400 MG tablet TAKE 1 TABLET BY MOUTH TWICE  DAILY 180 tablet 0   Ascorbic Acid (VITAMIN C PO) Take 500 mg by mouth every evening.     aspirin EC 81 MG tablet Take 1 tablet (81 mg total) by mouth daily. Swallow whole. 30 tablet 12   atorvastatin (LIPITOR) 40 MG tablet TAKE 1 TABLET BY MOUTH IN  THE EVENING (Patient taking differently: Take 40 mg by mouth daily.) 90 tablet 3   Bortezomib (VELCADE IJ) Inject as directed once a week.     brimonidine (ALPHAGAN) 0.2 % ophthalmic solution Place 1 drop into the left eye 2 (two) times daily.     cyanocobalamin 1000 MCG tablet Take 1 tablet (1,000 mcg total) by mouth daily. 30 tablet 3   dexamethasone (DECADRON) 2 MG tablet TAKE 5 TABLETS BY MOUTH ONCE  WEEKLY 60 tablet 0   dorzolamide-timolol (COSOPT) 22.3-6.8 MG/ML ophthalmic solution Place 1 drop into the left eye 2 (two) times daily.     feeding supplement (ENSURE ENLIVE / ENSURE PLUS) LIQD Take 237 mLs by mouth 3 (three) times daily between meals.     furosemide (LASIX) 20 MG tablet If you notice any swelling, you may take 40 mg (two tablets) for the day. (Patient taking differently: Take 20 mg by mouth 2 (two) times daily.) 90 tablet 3   isosorbide mononitrate (IMDUR) 60 MG 24 hr tablet  Take 1 tablet (60 mg total) by mouth daily. TAKE 1 AND 1/2 TABLETS BY  MOUTH IN THE MORNING AND  1/2 TABLET AT NIGHT     levothyroxine (SYNTHROID) 75 MCG tablet Take 75 mcg by mouth daily before breakfast.     lidocaine-prilocaine (EMLA) cream Apply 1 Application topically as needed. 30 g 1   losartan (COZAAR) 100 MG tablet TAKE 1 TABLET BY MOUTH  DAILY (Patient taking differently: Take 100 mg by mouth daily.) 90 tablet 1   metoprolol tartrate (LOPRESSOR) 25 MG tablet TAKE 1 TABLET BY MOUTH IN  THE MORNING AND ONE-HALF  TABLET BY MOUTH IN THE  EVENING (Patient taking differently: Take 12.5-25 mg by mouth in the morning and at bedtime. TAKE 1 TABLET BY MOUTH IN  THE MORNING AND ONE-HALF  TABLET BY MOUTH IN THE  EVENING) 135 tablet 3   Multiple Vitamins-Minerals (PRESERVISION/LUTEIN) CAPS Take 1 capsule by mouth 2 (two) times daily.     nitroGLYCERIN (NITROSTAT) 0.4 MG SL tablet Place 1 tablet (0.4 mg total) under the tongue every 5 (five) minutes as needed. For chest pain 25 tablet 2   pantoprazole (PROTONIX) 40 MG tablet TAKE 1 TABLET BY MOUTH DAILY  BEFORE BREAKFAST 90 tablet 3   potassium chloride SA (KLOR-CON M) 20 MEQ tablet TAKE 1 TABLET BY MOUTH  DAILY (Patient taking differently: Take 20 mEq by mouth daily.) 90 tablet 3   prochlorperazine (COMPAZINE) 10 MG tablet Take 1 tablet (10 mg total) by mouth every 6 (six) hours as needed for nausea or vomiting. 30 tablet 3   ROCKLATAN 0.02-0.005 % SOLN Place 1 drop into the left eye at bedtime.     triamcinolone (KENALOG) 0.1 % cream Apply 1 application. topically 2 (two) times daily as needed (for irritation).     vitamin E 200 UNIT capsule Take 200 Units  by mouth every evening.     No current facility-administered medications for this visit.   Facility-Administered Medications Ordered in Other Visits  Medication Dose Route Frequency Provider Last Rate Last Admin   acetaminophen (TYLENOL) 325 MG tablet            diphenhydrAMINE (BENADRYL) 25 mg  capsule            montelukast (SINGULAIR) 10 MG tablet             ALLERGIES:  Allergies  Allergen Reactions   Azithromycin Other (See Comments)    Sore mouth and fever blisters around mouth, sores in nose area as well   Doxazosin Shortness Of Breath   Atenolol Other (See Comments)    UNKNOWN REACTION   Hydrocodone Nausea And Vomiting   Levofloxacin Other (See Comments)    Caused stomach problems.   Morphine Other (See Comments)    "made me crazy"   Penicillins Nausea And Vomiting and Other (See Comments)    Has patient had a PCN reaction causing immediate rash, facial/tongue/throat swelling, SOB or lightheadedness with hypotension: No Has patient had a PCN reaction causing severe rash involving mucus membranes or skin necrosis: No Has patient had a PCN reaction that required hospitalization No Has patient had a PCN reaction occurring within the last 10 years: No If all of the above answers are "NO", then may proceed with Cephalosporin use.    Sulfonamide Derivatives Nausea And Vomiting    PHYSICAL EXAM:  Performance status (ECOG): 0 - Asymptomatic  There were no vitals filed for this visit.  Wt Readings from Last 3 Encounters:  03/14/22 123 lb 3.2 oz (55.9 kg)  02/19/22 126 lb (57.2 kg)  02/15/22 126 lb 12.8 oz (57.5 kg)   Physical Exam Vitals reviewed.  Constitutional:      Appearance: Normal appearance.  Cardiovascular:     Rate and Rhythm: Normal rate and regular rhythm.     Pulses: Normal pulses.     Heart sounds: Normal heart sounds.  Pulmonary:     Effort: Pulmonary effort is normal.     Breath sounds: Normal breath sounds.  Neurological:     General: No focal deficit present.     Mental Status: He is alert and oriented to person, place, and time.  Psychiatric:        Mood and Affect: Mood normal.        Behavior: Behavior normal.     LABORATORY DATA:  I have reviewed the labs as listed.     Latest Ref Rng & Units 02/28/2022    8:00 AM 02/21/2022    11:41 AM 02/14/2022    8:33 AM  CBC  WBC 4.0 - 10.5 K/uL 3.3  2.8  3.2   Hemoglobin 13.0 - 17.0 g/dL 7.3  8.3  7.5   Hematocrit 39.0 - 52.0 % 22.2  25.4  23.2   Platelets 150 - 400 K/uL 88  87  107       Latest Ref Rng & Units 02/28/2022    8:00 AM 02/14/2022    8:33 AM 02/07/2022    4:20 AM  CMP  Glucose 70 - 99 mg/dL 114  111  90   BUN 8 - 23 mg/dL _0 Creatinine 0.61 - 1.24 mg/dL 0.83  0.72  0.78   Sodium 135 - 145 mmol/L 135  133  132   Potassium 3.5 - 5.1 mmol/L 3.8  4.1  3.8  Chloride 98 - 111 mmol/L 107  107  104   CO2 22 - 32 mmol/L _0 Calcium 8.9 - 10.3 mg/dL 8.5  8.6  8.2   Total Protein 6.5 - 8.1 g/dL 8.7  8.5  7.6   Total Bilirubin 0.3 - 1.2 mg/dL 0.7  0.6  0.7   Alkaline Phos 38 - 126 U/L 57  60  56   AST 15 - 41 U/L _1 ALT 0 - 44 U/L _2 DIAGNOSTIC IMAGING:  I have independently reviewed the scans and discussed with the patient. No results found.   ASSESSMENT:  1.  IgG lambda plasma cell myeloma: - Work-up for macrocytic anemia on 12/01/2020 showed M spike of 2.9 g.  Immunofixation IgG lambda. - Lambda light chains elevated at 284.  Light chain ratio was 0.04.  LDH was 163.  Creatinine was 0.9 and calcium 8.9. - Bone marrow biopsy on 12/18/2020-hypercellular marrow for age with 48% atypical plasma cells with lambda light chain restriction. - Chromosome analysis was normal. 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 - Revlimid 5 mg, 2 weeks on/1 week off started on 01/20/2021.  Dexamethasone weekly 10 mg added on 02/07/2021.  Revlimid is discontinued around 06/15/2021 due to poor tolerance and ineffectiveness at low-dose.  Thrombocytopenia and anemia. - Velcade weekly on days 1, 8, 15 every 21 days along with dexamethasone 10 mg weekly started on 06/25/2021.  2.  Macrocytic anemia: - CBC on 12/01/2020 with hemoglobin 8.5 and MCV of 117. -  Denies any bleeding per rectum or melena.  3.  Social/family history: - Lives at home with his wife.  He does all ADLs and IADLs.  He even does yard work. - He worked at [employer] for 35 years.  Denies any chemical exposure.  Non-smoker. - No family history of malignancies.   PLAN:  1.  IgG lambda plasma cell myeloma, high risk: - We have started him back on Darzalex and Velcade every 2 weeks on 02/14/2022.  He is tolerating very well. - He is taking dexamethasone 10 mg weekly. - Does not report any side effects. - Reviewed labs today which showed almond 2.7 and improving.  CBC shows mild leukopenia and thrombocytopenia.  We have sent myeloma panel from today which is pending. - We will continue every 2 weeks of treatment at this time.  He will follow-up on 04/01/2022.  If the myeloma labs are not trending down, will consider changing regimen to weekly.   2.  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  Last transfusion was last week.  Today hemoglobin is 8.3.  No transfusion needed.  Will keep his hemoglobin above 8 with transfusions.  We will continue CBC monitoring every week.  3.  Thromboprophylaxis: - Aspirin 81 mg on hold because of the recent right shin skin tear.  4.  Right ankle swelling: - He has 1+ edema.  Continue Lasix as needed.  As per the son, he is not taking Lasix for the last 11 days.  5.  ID prophylaxis: - Continue acyclovir twice daily.   Orders placed this encounter:  No orders of the defined types were placed in this encounter.      Derek Jack, MD [facility] [phone]

## 2022-03-14 NOTE — Progress Notes (Signed)
Patients port flushed without difficulty.  Good blood return noted with no bruising or swelling noted at site.  Stable during access and blood draw.  Patient to remain accessed for treatment. 

## 2022-03-14 NOTE — Progress Notes (Signed)
Patient has been examined by Dr. Katragadda, and vital signs and labs have been reviewed. ANC, Creatinine, LFTs, hemoglobin, and platelets are within treatment parameters per M.D. - pt may proceed with treatment.  Primary RN and pharmacy notified.  

## 2022-03-15 LAB — KAPPA/LAMBDA LIGHT CHAINS
Kappa free light chain: 3.1 mg/L — ABNORMAL LOW (ref 3.3–19.4)
Kappa, lambda light chain ratio: 0.01 — ABNORMAL LOW (ref 0.26–1.65)
Lambda free light chains: 506 mg/L — ABNORMAL HIGH (ref 5.7–26.3)

## 2022-03-18 LAB — PROTEIN ELECTROPHORESIS, SERUM
A/G Ratio: 0.7 (ref 0.7–1.7)
Albumin ELP: 3.5 g/dL (ref 2.9–4.4)
Alpha-1-Globulin: 0.2 g/dL (ref 0.0–0.4)
Alpha-2-Globulin: 0.7 g/dL (ref 0.4–1.0)
Beta Globulin: 0.6 g/dL — ABNORMAL LOW (ref 0.7–1.3)
Gamma Globulin: 3.8 g/dL — ABNORMAL HIGH (ref 0.4–1.8)
Globulin, Total: 5.3 g/dL — ABNORMAL HIGH (ref 2.2–3.9)
M-Spike, %: 3.7 g/dL — ABNORMAL HIGH
Total Protein ELP: 8.8 g/dL — ABNORMAL HIGH (ref 6.0–8.5)

## 2022-03-19 DIAGNOSIS — D509 Iron deficiency anemia, unspecified: Secondary | ICD-10-CM | POA: Diagnosis not present

## 2022-03-19 DIAGNOSIS — I110 Hypertensive heart disease with heart failure: Secondary | ICD-10-CM | POA: Diagnosis not present

## 2022-03-19 DIAGNOSIS — C9000 Multiple myeloma not having achieved remission: Secondary | ICD-10-CM | POA: Diagnosis not present

## 2022-03-19 DIAGNOSIS — I509 Heart failure, unspecified: Secondary | ICD-10-CM | POA: Diagnosis not present

## 2022-03-19 DIAGNOSIS — D6481 Anemia due to antineoplastic chemotherapy: Secondary | ICD-10-CM | POA: Diagnosis not present

## 2022-03-19 DIAGNOSIS — T451X5D Adverse effect of antineoplastic and immunosuppressive drugs, subsequent encounter: Secondary | ICD-10-CM | POA: Diagnosis not present

## 2022-03-19 LAB — SAMPLE TO BLOOD BANK

## 2022-03-20 ENCOUNTER — Inpatient Hospital Stay: Payer: Medicare Other

## 2022-03-20 VITALS — BP 166/59 | HR 59 | Temp 97.9°F | Resp 18

## 2022-03-20 DIAGNOSIS — D696 Thrombocytopenia, unspecified: Secondary | ICD-10-CM

## 2022-03-20 DIAGNOSIS — C9000 Multiple myeloma not having achieved remission: Secondary | ICD-10-CM | POA: Diagnosis not present

## 2022-03-20 DIAGNOSIS — D630 Anemia in neoplastic disease: Secondary | ICD-10-CM | POA: Diagnosis not present

## 2022-03-20 DIAGNOSIS — D649 Anemia, unspecified: Secondary | ICD-10-CM

## 2022-03-20 DIAGNOSIS — Z95828 Presence of other vascular implants and grafts: Secondary | ICD-10-CM

## 2022-03-20 DIAGNOSIS — Z5112 Encounter for antineoplastic immunotherapy: Secondary | ICD-10-CM | POA: Diagnosis not present

## 2022-03-20 DIAGNOSIS — Z79899 Other long term (current) drug therapy: Secondary | ICD-10-CM | POA: Diagnosis not present

## 2022-03-20 LAB — CBC WITH DIFFERENTIAL/PLATELET
Abs Immature Granulocytes: 0.01 10*3/uL (ref 0.00–0.07)
Basophils Absolute: 0 10*3/uL (ref 0.0–0.1)
Basophils Relative: 0 %
Eosinophils Absolute: 0.1 10*3/uL (ref 0.0–0.5)
Eosinophils Relative: 2 %
HCT: 23.9 % — ABNORMAL LOW (ref 39.0–52.0)
Hemoglobin: 7.8 g/dL — ABNORMAL LOW (ref 13.0–17.0)
Immature Granulocytes: 0 %
Lymphocytes Relative: 22 %
Lymphs Abs: 0.8 10*3/uL (ref 0.7–4.0)
MCH: 34.4 pg — ABNORMAL HIGH (ref 26.0–34.0)
MCHC: 32.6 g/dL (ref 30.0–36.0)
MCV: 105.3 fL — ABNORMAL HIGH (ref 80.0–100.0)
Monocytes Absolute: 0.5 10*3/uL (ref 0.1–1.0)
Monocytes Relative: 15 %
Neutro Abs: 2.1 10*3/uL (ref 1.7–7.7)
Neutrophils Relative %: 61 %
Platelets: 88 10*3/uL — ABNORMAL LOW (ref 150–400)
RBC: 2.27 MIL/uL — ABNORMAL LOW (ref 4.22–5.81)
RDW: 24 % — ABNORMAL HIGH (ref 11.5–15.5)
WBC: 3.5 10*3/uL — ABNORMAL LOW (ref 4.0–10.5)
nRBC: 0 % (ref 0.0–0.2)

## 2022-03-20 LAB — SAMPLE TO BLOOD BANK

## 2022-03-20 LAB — PREPARE RBC (CROSSMATCH)

## 2022-03-20 MED ORDER — SODIUM CHLORIDE 0.9% FLUSH
10.0000 mL | Freq: Once | INTRAVENOUS | Status: AC
  Administered 2022-03-20: 10 mL via INTRAVENOUS

## 2022-03-20 MED ORDER — HEPARIN SOD (PORK) LOCK FLUSH 100 UNIT/ML IV SOLN
500.0000 [IU] | Freq: Once | INTRAVENOUS | Status: AC
  Administered 2022-03-20: 500 [IU] via INTRAVENOUS

## 2022-03-20 NOTE — Patient Instructions (Signed)
[facility]  Discharge Instructions: Thank you for choosing [facility] to provide your oncology and hematology care.  If you have a lab appointment with the [facility], please come in thru the Main Entrance and check in at the main information desk.  Wear comfortable clothing and clothing appropriate for easy access to any Portacath or PICC line.   We strive to give you quality time with your provider. You may need to reschedule your appointment if you arrive late (15 or more minutes).  Arriving late affects you and other patients whose appointments are after yours.  Also, if you miss three or more appointments without notifying the office, you may be dismissed from the clinic at the provider's discretion.      For prescription refill requests, have your pharmacy contact our office and allow 72 hours for refills to be completed.    Today you received the following port flush with lab draw, return as scheduled.   To help prevent nausea and vomiting after your treatment, we encourage you to take your nausea medication as directed.  BELOW ARE SYMPTOMS THAT SHOULD BE REPORTED IMMEDIATELY: *FEVER GREATER THAN 100.4 F (38 C) OR HIGHER *CHILLS OR SWEATING *NAUSEA AND VOMITING THAT IS NOT CONTROLLED WITH YOUR NAUSEA MEDICATION *UNUSUAL SHORTNESS OF BREATH *UNUSUAL BRUISING OR BLEEDING *URINARY PROBLEMS (pain or burning when urinating, or frequent urination) *BOWEL PROBLEMS (unusual diarrhea, constipation, pain near the anus) TENDERNESS IN MOUTH AND THROAT WITH OR WITHOUT PRESENCE OF ULCERS (sore throat, sores in mouth, or a toothache) UNUSUAL RASH, SWELLING OR PAIN  UNUSUAL VAGINAL DISCHARGE OR ITCHING   Items with * indicate a potential emergency and should be followed up as soon as possible or go to the Emergency Department if any problems should occur.  Please show the CHEMOTHERAPY ALERT CARD or IMMUNOTHERAPY ALERT CARD at check-in to the Emergency  Department and triage nurse.  Should you have questions after your visit or need to cancel or reschedule your appointment, please contact [facility] [phone]  and follow the prompts.  Office hours are 8:00 a.m. to 4:30 p.m. Monday - Friday. Please note that voicemails left after 4:00 p.m. may not be returned until the following business day.  We are closed weekends and major holidays. You have access to a nurse at all times for urgent questions. Please call the main number to the clinic [phone] and follow the prompts.  For any non-urgent questions, you may also contact your provider using MyChart. We now offer e-Visits for anyone 40 and older to request care online for non-urgent symptoms. For details visit [email or web address].   Also download the MyChart app! Go to the app store, search "MyChart", open the app, select [facility], and log in with your MyChart username and password.  Masks are optional in the cancer centers. If you would like for your care team to wear a mask while they are taking care of you, please let them know. You may have one support person who is at least 86 years old accompany you for your appointments.

## 2022-03-20 NOTE — Progress Notes (Signed)
Called patient and spoke with patient's wife and patient pertaining to blood administration tomorrow at 09:45 am. Patient and wife verbalized an understanding. Blue arm band on patient and verified during phone call. Patient verbalized understanding to leave blue blood bracelet on and wear in tomorrow.

## 2022-03-20 NOTE — Progress Notes (Signed)
Port flushed with good blood return noted, labs drawn. No bruising or swelling at site. Bandaid applied and patient discharged in satisfactory condition. VVS stable with no signs or symptoms of distressed noted. 

## 2022-03-20 NOTE — Addendum Note (Signed)
Addended by: Benjiman Core D on: 03/20/2022 01:47 PM   Modules accepted: Orders

## 2022-03-21 ENCOUNTER — Ambulatory Visit: Payer: Medicare Other | Admitting: Hematology

## 2022-03-21 ENCOUNTER — Ambulatory Visit: Payer: Medicare Other | Admitting: Cardiovascular Disease

## 2022-03-21 ENCOUNTER — Inpatient Hospital Stay: Payer: Medicare Other

## 2022-03-21 ENCOUNTER — Other Ambulatory Visit: Payer: Medicare Other

## 2022-03-21 ENCOUNTER — Ambulatory Visit: Payer: Medicare Other

## 2022-03-21 DIAGNOSIS — Z5112 Encounter for antineoplastic immunotherapy: Secondary | ICD-10-CM | POA: Diagnosis not present

## 2022-03-21 DIAGNOSIS — C9000 Multiple myeloma not having achieved remission: Secondary | ICD-10-CM | POA: Diagnosis not present

## 2022-03-21 DIAGNOSIS — D630 Anemia in neoplastic disease: Secondary | ICD-10-CM | POA: Diagnosis not present

## 2022-03-21 DIAGNOSIS — Z79899 Other long term (current) drug therapy: Secondary | ICD-10-CM | POA: Diagnosis not present

## 2022-03-21 LAB — BPAM RBC
Blood Product Expiration Date: 202312132359
Unit Type and Rh: 9500

## 2022-03-21 MED ORDER — DIPHENHYDRAMINE HCL 25 MG PO CAPS
25.0000 mg | ORAL_CAPSULE | Freq: Once | ORAL | Status: AC
  Administered 2022-03-21: 25 mg via ORAL
  Filled 2022-03-21: qty 1

## 2022-03-21 MED ORDER — SODIUM CHLORIDE 0.9% IV SOLUTION
250.0000 mL | Freq: Once | INTRAVENOUS | Status: AC
  Administered 2022-03-21: 250 mL via INTRAVENOUS

## 2022-03-21 MED ORDER — SODIUM CHLORIDE 0.9% FLUSH
10.0000 mL | INTRAVENOUS | Status: AC | PRN
  Administered 2022-03-21: 10 mL

## 2022-03-21 MED ORDER — HEPARIN SOD (PORK) LOCK FLUSH 100 UNIT/ML IV SOLN
500.0000 [IU] | Freq: Every day | INTRAVENOUS | Status: AC | PRN
  Administered 2022-03-21: 500 [IU]

## 2022-03-21 MED ORDER — ACETAMINOPHEN 325 MG PO TABS
650.0000 mg | ORAL_TABLET | Freq: Once | ORAL | Status: AC
  Administered 2022-03-21: 650 mg via ORAL
  Filled 2022-03-21: qty 2

## 2022-03-21 NOTE — Progress Notes (Signed)
One unit of blood transfused today per orders. Patient tolerated it well without problems. Vitals stable and discharged home from clinic ambulatory. Follow up as scheduled.

## 2022-03-21 NOTE — Patient Instructions (Signed)
[facility]  Discharge Instructions: Thank you for choosing [facility] to provide your oncology and hematology care.  If you have a lab appointment with the [facility], please come in thru the Main Entrance and check in at the main information desk.  Wear comfortable clothing and clothing appropriate for easy access to any Portacath or PICC line.   We strive to give you quality time with your provider. You may need to reschedule your appointment if you arrive late (15 or more minutes).  Arriving late affects you and other patients whose appointments are after yours.  Also, if you miss three or more appointments without notifying the office, you may be dismissed from the clinic at the provider's discretion.      For prescription refill requests, have your pharmacy contact our office and allow 72 hours for refills to be completed.    Today you received one unit of blood   To help prevent nausea and vomiting after your treatment, we encourage you to take your nausea medication as directed.  BELOW ARE SYMPTOMS THAT SHOULD BE REPORTED IMMEDIATELY: *FEVER GREATER THAN 100.4 F (38 C) OR HIGHER *CHILLS OR SWEATING *NAUSEA AND VOMITING THAT IS NOT CONTROLLED WITH YOUR NAUSEA MEDICATION *UNUSUAL SHORTNESS OF BREATH *UNUSUAL BRUISING OR BLEEDING *URINARY PROBLEMS (pain or burning when urinating, or frequent urination) *BOWEL PROBLEMS (unusual diarrhea, constipation, pain near the anus) TENDERNESS IN MOUTH AND THROAT WITH OR WITHOUT PRESENCE OF ULCERS (sore throat, sores in mouth, or a toothache) UNUSUAL RASH, SWELLING OR PAIN  UNUSUAL VAGINAL DISCHARGE OR ITCHING   Items with * indicate a potential emergency and should be followed up as soon as possible or go to the Emergency Department if any problems should occur.  Please show the CHEMOTHERAPY ALERT CARD or IMMUNOTHERAPY ALERT CARD at check-in to the Emergency Department and triage nurse.  Should you have  questions after your visit or need to cancel or reschedule your appointment, please contact [facility] [phone]  and follow the prompts.  Office hours are 8:00 a.m. to 4:30 p.m. Monday - Friday. Please note that voicemails left after 4:00 p.m. may not be returned until the following business day.  We are closed weekends and major holidays. You have access to a nurse at all times for urgent questions. Please call the main number to the clinic [phone] and follow the prompts.  For any non-urgent questions, you may also contact your provider using MyChart. We now offer e-Visits for anyone 90 and older to request care online for non-urgent symptoms. For details visit [email or web address].   Also download the MyChart app! Go to the app store, search "MyChart", open the app, select [facility], and log in with your MyChart username and password.  Masks are optional in the cancer centers. If you would like for your care team to wear a mask while they are taking care of you, please let them know. You may have one support person who is at least 86 years old accompany you for your appointments.

## 2022-03-22 LAB — TYPE AND SCREEN
ABO/RH(D): O POS
Antibody Screen: POSITIVE
DAT, IgG: NEGATIVE
Unit division: 0

## 2022-03-26 ENCOUNTER — Inpatient Hospital Stay: Payer: Medicare Other

## 2022-03-26 VITALS — BP 168/59 | HR 76 | Temp 97.1°F | Resp 16

## 2022-03-26 DIAGNOSIS — Z5112 Encounter for antineoplastic immunotherapy: Secondary | ICD-10-CM | POA: Diagnosis not present

## 2022-03-26 DIAGNOSIS — Z79899 Other long term (current) drug therapy: Secondary | ICD-10-CM | POA: Diagnosis not present

## 2022-03-26 DIAGNOSIS — D696 Thrombocytopenia, unspecified: Secondary | ICD-10-CM

## 2022-03-26 DIAGNOSIS — C9000 Multiple myeloma not having achieved remission: Secondary | ICD-10-CM | POA: Diagnosis not present

## 2022-03-26 DIAGNOSIS — D649 Anemia, unspecified: Secondary | ICD-10-CM

## 2022-03-26 DIAGNOSIS — D630 Anemia in neoplastic disease: Secondary | ICD-10-CM | POA: Diagnosis not present

## 2022-03-26 DIAGNOSIS — Z95828 Presence of other vascular implants and grafts: Secondary | ICD-10-CM

## 2022-03-26 LAB — CBC WITH DIFFERENTIAL/PLATELET
Abs Immature Granulocytes: 0.01 10*3/uL (ref 0.00–0.07)
Basophils Absolute: 0 10*3/uL (ref 0.0–0.1)
Basophils Relative: 0 %
Eosinophils Absolute: 0.1 10*3/uL (ref 0.0–0.5)
Eosinophils Relative: 4 %
HCT: 25.7 % — ABNORMAL LOW (ref 39.0–52.0)
Hemoglobin: 8.6 g/dL — ABNORMAL LOW (ref 13.0–17.0)
Immature Granulocytes: 0 %
Lymphocytes Relative: 27 %
Lymphs Abs: 0.9 10*3/uL (ref 0.7–4.0)
MCH: 33.5 pg (ref 26.0–34.0)
MCHC: 33.5 g/dL (ref 30.0–36.0)
MCV: 100 fL (ref 80.0–100.0)
Monocytes Absolute: 0.3 10*3/uL (ref 0.1–1.0)
Monocytes Relative: 10 %
Neutro Abs: 1.9 10*3/uL (ref 1.7–7.7)
Neutrophils Relative %: 59 %
Platelets: 75 10*3/uL — ABNORMAL LOW (ref 150–400)
RBC: 2.57 MIL/uL — ABNORMAL LOW (ref 4.22–5.81)
RDW: 25.7 % — ABNORMAL HIGH (ref 11.5–15.5)
WBC Morphology: REACTIVE
WBC: 3.3 10*3/uL — ABNORMAL LOW (ref 4.0–10.5)
nRBC: 0 % (ref 0.0–0.2)

## 2022-03-26 LAB — SAMPLE TO BLOOD BANK

## 2022-03-26 MED ORDER — HEPARIN SOD (PORK) LOCK FLUSH 100 UNIT/ML IV SOLN
500.0000 [IU] | Freq: Once | INTRAVENOUS | Status: AC
  Administered 2022-03-26: 500 [IU] via INTRAVENOUS

## 2022-03-26 MED ORDER — SODIUM CHLORIDE 0.9% FLUSH
10.0000 mL | Freq: Once | INTRAVENOUS | Status: AC
  Administered 2022-03-26: 10 mL via INTRAVENOUS

## 2022-03-26 NOTE — Progress Notes (Signed)
Port flushed with good blood return noted, labs drawn. No bruising or swelling at site. Bandaid applied and patient discharged in satisfactory condition. VVS stable with no signs or symptoms of distressed noted. 

## 2022-03-26 NOTE — Patient Instructions (Signed)
[facility]  Discharge Instructions: Thank you for choosing [facility] to provide your oncology and hematology care.  If you have a lab appointment with the [facility], please come in thru the Main Entrance and check in at the main information desk.  Wear comfortable clothing and clothing appropriate for easy access to any Portacath or PICC line.   We strive to give you quality time with your provider. You may need to reschedule your appointment if you arrive late (15 or more minutes).  Arriving late affects you and other patients whose appointments are after yours.  Also, if you miss three or more appointments without notifying the office, you may be dismissed from the clinic at the provider's discretion.      For prescription refill requests, have your pharmacy contact our office and allow 72 hours for refills to be completed.    Today you received the following port flushed and labs drawn, return as scheduled.   To help prevent nausea and vomiting after your treatment, we encourage you to take your nausea medication as directed.  BELOW ARE SYMPTOMS THAT SHOULD BE REPORTED IMMEDIATELY: *FEVER GREATER THAN 100.4 F (38 C) OR HIGHER *CHILLS OR SWEATING *NAUSEA AND VOMITING THAT IS NOT CONTROLLED WITH YOUR NAUSEA MEDICATION *UNUSUAL SHORTNESS OF BREATH *UNUSUAL BRUISING OR BLEEDING *URINARY PROBLEMS (pain or burning when urinating, or frequent urination) *BOWEL PROBLEMS (unusual diarrhea, constipation, pain near the anus) TENDERNESS IN MOUTH AND THROAT WITH OR WITHOUT PRESENCE OF ULCERS (sore throat, sores in mouth, or a toothache) UNUSUAL RASH, SWELLING OR PAIN  UNUSUAL VAGINAL DISCHARGE OR ITCHING   Items with * indicate a potential emergency and should be followed up as soon as possible or go to the Emergency Department if any problems should occur.  Please show the CHEMOTHERAPY ALERT CARD or IMMUNOTHERAPY ALERT CARD at check-in to the Emergency  Department and triage nurse.  Should you have questions after your visit or need to cancel or reschedule your appointment, please contact [facility] [phone]  and follow the prompts.  Office hours are 8:00 a.m. to 4:30 p.m. Monday - Friday. Please note that voicemails left after 4:00 p.m. may not be returned until the following business day.  We are closed weekends and major holidays. You have access to a nurse at all times for urgent questions. Please call the main number to the clinic [phone] and follow the prompts.  For any non-urgent questions, you may also contact your provider using MyChart. We now offer e-Visits for anyone 21 and older to request care online for non-urgent symptoms. For details visit [email or web address].   Also download the MyChart app! Go to the app store, search "MyChart", open the app, select [facility], and log in with your MyChart username and password.  Masks are optional in the cancer centers. If you would like for your care team to wear a mask while they are taking care of you, please let them know. You may have one support person who is at least 86 years old accompany you for your appointments.

## 2022-03-27 ENCOUNTER — Ambulatory Visit: Payer: Medicare Other

## 2022-03-27 ENCOUNTER — Other Ambulatory Visit: Payer: Medicare Other

## 2022-03-27 ENCOUNTER — Inpatient Hospital Stay: Payer: Medicare Other

## 2022-03-27 ENCOUNTER — Ambulatory Visit: Payer: Medicare Other | Admitting: Hematology

## 2022-03-31 NOTE — Progress Notes (Signed)
[place], [place] [address]   CLINIC:  Medical Oncology/Hematology  PCP:  No primary care provider on file. No primary physician on file. None   REASON FOR VISIT:  Follow-up for multiple myeloma and anemia  PRIOR THERAPY: none  CURRENT THERAPY: Weekly Velcade and dexamethasone  BRIEF ONCOLOGIC HISTORY:  Oncology History  Multiple myeloma ([facility])  01/15/2021 Initial Diagnosis   Multiple myeloma ([facility])   06/25/2021 - 12/06/2021 Chemotherapy   Patient is on Treatment Plan : MYELOMA NON-TRANSPLANT CANDIDATES VRd weekly q21d     12/20/2021 -  Chemotherapy   Patient is on Treatment Plan : MYELOMA Daratumumab IV q28d       CANCER STAGING:  Cancer Staging  No matching staging information was found for the patient.  INTERVAL HISTORY:  [name], a 88 y.o. male, seen for follow-up of multiple myeloma.  Energy levels are 75%.  Denies any falls in the last 2 weeks.  Denies any bleeding episodes.  No ER visits or hospitalizations.   REVIEW OF SYSTEMS:  Review of Systems  All other systems reviewed and are negative.   PAST MEDICAL/SURGICAL HISTORY:  Past Medical History:  Diagnosis Date   Allergic rhinitis    Anal fissure    Anginal pain (HCC)    Asthma    Cancer (HCC)    skin   Chest pain    CHF (congestive heart failure) ([facility])    Coronary heart disease    s/p stenting. cath in 01/2012 noncritical occlusion   Dyspnea    Dysrhythmia    1st degree heart block   GERD (gastroesophageal reflux disease)    Glaucoma    Hiatal hernia    Hyperlipidemia    Hypertension    Hypothyroidism    Idiopathic thrombocytopenic purpura ([place] [facility]) 2002   Macular degeneration    Nephrolithiasis    PUD (peptic ulcer disease)    remote   Sarcoidosis    pulmonary   Schatzki's ring    Past Surgical History:  Procedure Laterality Date   cardiac stents     COLONOSCOPY  10/30/2006   Normal rectum, sigmoid diverticula.Remainder of colonic mucosa  appeared normal.   CORONARY ANGIOPLASTY WITH STENT PLACEMENT     about 10 years ago per pt (around 2007)   Dale [name], URETEROSCOPY AND STENT PLACEMENT Left 06/16/2017   Procedure: CYSTOSCOPY WITH RETROGRADE PYELOGRAM, URETEROSCOPY,STONE EXTRACTION  AND STENT PLACEMENT;  Surgeon: Dale Gallo, MD;  Location: [facility];  Service: Urology;  Laterality: Left;   ESOPHAGOGASTRODUODENOSCOPY  06/19/2004   Two esophageal rings and esophageal web as described above.  All of these were disrupted by passing 56-[place] Dale [name] dilator/ Dale [name] esophagitis,which appears to be incidental given history of   antibiotic use, but nevertheless will be treated.   ESOPHAGOGASTRODUODENOSCOPY  10/30/2006   Distal tandem esophageal ring status post dilation disruption as  described above.  Otherwise normal esophagus/  Small hiatal hernia otherwise normal stomach, D1 and D2   ESOPHAGOGASTRODUODENOSCOPY N/A 03/22/2015   Dr.Rourk- noncritical schatzki's ring and hiatal hernia-o/w normal EGD.    ESOPHAGOGASTRODUODENOSCOPY (EGD) WITH ESOPHAGEAL DILATION  03/04/2012   RMR- schatzki's ring, hiatal hernia, polypoid gastric mucosa, bx= minimally active gastritis.   HOLMIUM LASER APPLICATION Left 1/00/7121   Procedure: HOLMIUM LASER APPLICATION;  Surgeon: Dale Gallo, MD;  Location: [facility];  Service: Urology;  Laterality: Left;   IR CV LINE INJECTION  12/27/2021   IR GENERIC HISTORICAL  03/06/2016   IR  RADIOLOGIST EVAL & MGMT 03/06/2016 Dale Edouard, MD [facility] INTERV RAD   IR GENERIC HISTORICAL  06/18/2016   IR RADIOLOGIST EVAL & MGMT 06/18/2016 Dale Edouard, MD [facility] INTERV RAD   IR IMAGING GUIDED PORT INSERTION  12/18/2021   IR IMAGING GUIDED PORT INSERTION  01/15/2022   IR RADIOLOGIST EVAL & MGMT  10/01/2016   IR RADIOLOGIST EVAL & MGMT  10/15/2017   IR RADIOLOGIST EVAL & MGMT  12/24/2018   IR RADIOLOGIST EVAL & MGMT  01/04/2020   IR RADIOLOGIST EVAL & MGMT  06/20/2021   IR REMOVAL TUN ACCESS  W/ PORT W/O FL MOD SED  01/15/2022   LEFT HEART CATH N/A 02/02/2012   Procedure: LEFT HEART CATH;  Surgeon: Dale Harp, MD;  Location[facility] C[facility];  Service: Cardiovascular;  Laterality: N/A;   MEDIASTINOSCOPY     for dx sarcoid   RADIOLOGY WITH ANESTHESIA Left 05/17/2016   Procedure: left renal ablation;  Surgeon: Dale Edouard, MD;  Location: [facility];  Service: Radiology;  Laterality: Left;    SOCIAL HISTORY:  Social History   Socioeconomic History   Marital status: Married    Spouse name: Not on file   Number of children: 1   Years of education: Not on file   Highest education level: Not on file  Occupational History   Occupation: Retired    Comment: [employer]: RETIRED  Tobacco Use   Smoking status: Former    Packs/day: 0.10    Years: 2.00    Total pack years: 0.20    Types: Cigarettes, Cigars    Quit date: 05/06/1970    Years since quitting: 51.9   Smokeless tobacco: Never  Vaping Use   Vaping Use: Never used  Substance and Sexual Activity   Alcohol use: No    Alcohol/week: 0.0 standard drinks of alcohol   Drug use: No   Sexual activity: Never  Other Topics Concern   Not on file  Social History Narrative   Not on file   Social Determinants of Health   Financial Resource Strain: Not on file  Food Insecurity: No Food Insecurity (02/06/2022)   Hunger Vital Sign    Worried About Running Out of Food in the Last Year: Never true    Ran Out of Food in the Last Year: Never true  Transportation Needs: No Transportation Needs (02/06/2022)   PRAPARE - Hydrologist (Medical): No    Lack of Transportation (Non-Medical): No  Physical Activity: Not on file  Stress: Not on file  Social Connections: Not on file  Intimate Partner Violence: Not At Risk (02/06/2022)   Humiliation, Afraid, Rape, and Kick questionnaire    Fear of Current or Ex-Partner: No    Emotionally Abused: No    Physically Abused: No    Sexually  Abused: No    FAMILY HISTORY:  Family History  Problem Relation Age of Onset   Heart disease Father        deceased age 37   Stroke Mother    Alzheimer's disease Mother    Heart attack Brother        deceased age 58   Cancer Other        niece   Colon cancer Neg Hx     CURRENT MEDICATIONS:  Current Outpatient Medications  Medication Sig Dispense Refill   acetaminophen (TYLENOL) 325 MG tablet Take 2 tablets (650 mg total) by mouth every 6 (six) hours  as needed for mild pain (or Fever >/= 101).     acyclovir (ZOVIRAX) 400 MG tablet TAKE 1 TABLET BY MOUTH TWICE  DAILY 180 tablet 0   Ascorbic Acid (VITAMIN C PO) Take 500 mg by mouth every evening.     atorvastatin (LIPITOR) 40 MG tablet TAKE 1 TABLET BY MOUTH IN  THE EVENING (Patient taking differently: Take 40 mg by mouth daily.) 90 tablet 3   Bortezomib (VELCADE IJ) Inject as directed once a week.     brimonidine (ALPHAGAN) 0.2 % ophthalmic solution Place 1 drop into the left eye 2 (two) times daily.     cyanocobalamin 1000 MCG tablet Take 1 tablet (1,000 mcg total) by mouth daily. 30 tablet 3   dexamethasone (DECADRON) 2 MG tablet TAKE 5 TABLETS BY MOUTH ONCE  WEEKLY 60 tablet 0   dorzolamide-timolol (COSOPT) 22.3-6.8 MG/ML ophthalmic solution Place 1 drop into the left eye 2 (two) times daily.     feeding supplement (ENSURE ENLIVE / ENSURE PLUS) LIQD Take 237 mLs by mouth 3 (three) times daily between meals.     furosemide (LASIX) 20 MG tablet If you notice any swelling, you may take 40 mg (two tablets) for the day. (Patient taking differently: Take 20 mg by mouth 2 (two) times daily.) 90 tablet 3   isosorbide mononitrate (IMDUR) 60 MG 24 hr tablet Take 1 tablet (60 mg total) by mouth daily. TAKE 1 AND 1/2 TABLETS BY  MOUTH IN THE MORNING AND  1/2 TABLET AT NIGHT     levothyroxine (SYNTHROID) 75 MCG tablet Take 75 mcg by mouth daily before breakfast.     lidocaine-prilocaine (EMLA) cream Apply 1 Application topically as needed. 30 g  1   losartan (COZAAR) 100 MG tablet TAKE 1 TABLET BY MOUTH  DAILY (Patient taking differently: Take 100 mg by mouth daily.) 90 tablet 1   metoprolol tartrate (LOPRESSOR) 25 MG tablet TAKE 1 TABLET BY MOUTH IN  THE MORNING AND ONE-HALF  TABLET BY MOUTH IN THE  EVENING (Patient taking differently: Take 12.5-25 mg by mouth in the morning and at bedtime. TAKE 1 TABLET BY MOUTH IN  THE MORNING AND ONE-HALF  TABLET BY MOUTH IN THE  EVENING) 135 tablet 3   Multiple Vitamins-Minerals (PRESERVISION/LUTEIN) CAPS Take 1 capsule by mouth 2 (two) times daily.     nitroGLYCERIN (NITROSTAT) 0.4 MG SL tablet Place 1 tablet (0.4 mg total) under the tongue every 5 (five) minutes as needed. For chest pain 25 tablet 2   pantoprazole (PROTONIX) 40 MG tablet TAKE 1 TABLET BY MOUTH DAILY  BEFORE BREAKFAST 90 tablet 3   potassium chloride SA (KLOR-CON M) 20 MEQ tablet TAKE 1 TABLET BY MOUTH  DAILY (Patient taking differently: Take 20 mEq by mouth daily.) 90 tablet 3   prochlorperazine (COMPAZINE) 10 MG tablet Take 1 tablet (10 mg total) by mouth every 6 (six) hours as needed for nausea or vomiting. 30 tablet 3   ROCKLATAN 0.02-0.005 % SOLN Place 1 drop into the left eye at bedtime.     triamcinolone (KENALOG) 0.1 % cream Apply 1 application. topically 2 (two) times daily as needed (for irritation).     vitamin E 200 UNIT capsule Take 200 Units by mouth every evening.     No current facility-administered medications for this visit.   Facility-Administered Medications Ordered in Other Visits  Medication Dose Route Frequency Provider Last Rate Last Admin   acetaminophen (TYLENOL) 325 MG tablet  diphenhydrAMINE (BENADRYL) 25 mg capsule            montelukast (SINGULAIR) 10 MG tablet             ALLERGIES:  Allergies  Allergen Reactions   Azithromycin Other (See Comments)    Sore mouth and fever blisters around mouth, sores in nose area as well   Doxazosin Shortness Of Breath   Atenolol Other (See Comments)     UNKNOWN REACTION   Hydrocodone Nausea And Vomiting   Levofloxacin Other (See Comments)    Caused stomach problems.   Morphine Other (See Comments)    "made me crazy"   Penicillins Nausea And Vomiting and Other (See Comments)    Has patient had a PCN reaction causing immediate rash, facial/tongue/throat swelling, SOB or lightheadedness with hypotension: No Has patient had a PCN reaction causing severe rash involving mucus membranes or skin necrosis: No Has patient had a PCN reaction that required hospitalization No Has patient had a PCN reaction occurring within the last 10 years: No If all of the above answers are "NO", then may proceed with Cephalosporin use.    Sulfonamide Derivatives Nausea And Vomiting    PHYSICAL EXAM:  Performance status (ECOG): 0 - Asymptomatic  There were no vitals filed for this visit.  Wt Readings from Last 3 Encounters:  03/14/22 123 lb 3.2 oz (55.9 kg)  02/19/22 126 lb (57.2 kg)  02/15/22 126 lb 12.8 oz (57.5 kg)   Physical Exam Vitals reviewed.  Constitutional:      Appearance: Normal appearance.  Cardiovascular:     Rate and Rhythm: Normal rate and regular rhythm.     Pulses: Normal pulses.     Heart sounds: Normal heart sounds.  Pulmonary:     Effort: Pulmonary effort is normal.     Breath sounds: Normal breath sounds.  Neurological:     General: No focal deficit present.     Mental Status: He is alert and oriented to person, place, and time.  Psychiatric:        Mood and Affect: Mood normal.        Behavior: Behavior normal.    LABORATORY DATA:  I have reviewed the labs as listed.     Latest Ref Rng & Units 03/26/2022    9:52 AM 03/20/2022   10:12 AM 03/14/2022    8:30 AM  CBC  WBC 4.0 - 10.5 K/uL 3.3  3.5  3.4   Hemoglobin 13.0 - 17.0 g/dL 8.6  7.8  8.3   Hematocrit 39.0 - 52.0 % 25.7  23.9  24.7   Platelets 150 - 400 K/uL 75  88  99       Latest Ref Rng & Units 03/14/2022    8:30 AM 02/28/2022    8:00 AM 02/14/2022     8:33 AM  CMP  Glucose 70 - 99 mg/dL 109  114  111   BUN 8 - 23 mg/dL _0 Creatinine 0.61 - 1.24 mg/dL 0.85  0.83  0.72   Sodium 135 - 145 mmol/L 134  135  133   Potassium 3.5 - 5.1 mmol/L 4.0  3.8  4.1   Chloride 98 - 111 mmol/L 107  107  107   CO2 22 - 32 mmol/L _1 Calcium 8.9 - 10.3 mg/dL 8.7  8.5  8.6   Total Protein 6.5 - 8.1 g/dL 8.8  8.7  8.5   Total Bilirubin 0.3 -  1.2 mg/dL 0.7  0.7  0.6   Alkaline Phos 38 - 126 U/L 56  57  60   AST 15 - 41 U/L _0 ALT 0 - 44 U/L _1 DIAGNOSTIC IMAGING:  I have independently reviewed the scans and discussed with the patient. No results found.   ASSESSMENT:  1.  IgG lambda plasma cell myeloma: - Work-up for macrocytic anemia on 12/01/2020 showed M spike of 2.9 g.  Immunofixation IgG lambda. - Lambda light chains elevated at 284.  Light chain ratio was 0.04.  LDH was 163.  Creatinine was 0.9 and calcium 8.9. - Bone marrow biopsy on 12/18/2020-hypercellular marrow for age with 48% atypical plasma cells with lambda light chain restriction. - Chromosome analysis was normal. 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 - Revlimid 5 mg, 2 weeks on/1 week off started on 01/20/2021.  Dexamethasone weekly 10 mg added on 02/07/2021.  Revlimid is discontinued around 06/15/2021 due to poor tolerance and ineffectiveness at low-dose.  Thrombocytopenia and anemia. - Velcade weekly on days 1, 8, 15 every 21 days along with dexamethasone 10 mg weekly started on 06/25/2021.  2.  Macrocytic anemia: - CBC on 12/01/2020 with hemoglobin 8.5 and MCV of 117. - Denies any bleeding per rectum or melena.  3.  Social/family history: - Lives at home with his wife.  He does all ADLs and IADLs.  He even does yard work. - He worked at [employer] for 35 years.  Denies any chemical exposure.  Non-smoker. - No family history of malignancies.   PLAN:  1.  IgG  lambda plasma cell myeloma, high risk: - We reviewed myeloma panel from 03/14/2022.  M spike increased to 3.7 g from 3 g.  Lambda light chains of 506 with ratio of 0.01. - Recommend changing Darzalex and Velcade to weekly regimen to obtain better response.  Will consider faster infusion of Darzalex to minimize infusion times.  Continue dexamethasone 10 mg weekly. - Reviewed labs today which showed normal LFTs and CBC was grossly normal.  May proceed with treatment today.  Reevaluate in 3 weeks with repeat myeloma labs.   2.  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  Continue weekly labs and transfuse as needed to keep hemoglobin above 8.  3.  Thromboprophylaxis: - Continue aspirin 81 mg daily.  4.  Right ankle swelling: - He has 1+ edema in the right ankle and no edema in the left ankle.  Continue Lasix as needed.  5.  ID prophylaxis: - Continue acyclovir twice daily.   Orders placed this encounter:  No orders of the defined types were placed in this encounter.      Derek Jack, MD [facility] [phone]

## 2022-04-01 ENCOUNTER — Inpatient Hospital Stay: Payer: Medicare Other

## 2022-04-01 ENCOUNTER — Inpatient Hospital Stay (HOSPITAL_BASED_OUTPATIENT_CLINIC_OR_DEPARTMENT_OTHER): Payer: Medicare Other | Admitting: Hematology

## 2022-04-01 VITALS — BP 136/58 | HR 81 | Temp 98.3°F | Resp 18

## 2022-04-01 DIAGNOSIS — D630 Anemia in neoplastic disease: Secondary | ICD-10-CM | POA: Diagnosis not present

## 2022-04-01 DIAGNOSIS — C9000 Multiple myeloma not having achieved remission: Secondary | ICD-10-CM | POA: Diagnosis not present

## 2022-04-01 DIAGNOSIS — D696 Thrombocytopenia, unspecified: Secondary | ICD-10-CM

## 2022-04-01 DIAGNOSIS — Z5112 Encounter for antineoplastic immunotherapy: Secondary | ICD-10-CM | POA: Diagnosis not present

## 2022-04-01 DIAGNOSIS — D649 Anemia, unspecified: Secondary | ICD-10-CM

## 2022-04-01 DIAGNOSIS — Z79899 Other long term (current) drug therapy: Secondary | ICD-10-CM | POA: Diagnosis not present

## 2022-04-01 LAB — CBC WITH DIFFERENTIAL/PLATELET
Abs Immature Granulocytes: 0.02 10*3/uL (ref 0.00–0.07)
Basophils Absolute: 0 10*3/uL (ref 0.0–0.1)
Basophils Relative: 0 %
Eosinophils Absolute: 0.2 10*3/uL (ref 0.0–0.5)
Eosinophils Relative: 7 %
HCT: 24.3 % — ABNORMAL LOW (ref 39.0–52.0)
Hemoglobin: 8 g/dL — ABNORMAL LOW (ref 13.0–17.0)
Immature Granulocytes: 1 %
Lymphocytes Relative: 30 %
Lymphs Abs: 0.9 10*3/uL (ref 0.7–4.0)
MCH: 33.6 pg (ref 26.0–34.0)
MCHC: 32.9 g/dL (ref 30.0–36.0)
MCV: 102.1 fL — ABNORMAL HIGH (ref 80.0–100.0)
Monocytes Absolute: 0.5 10*3/uL (ref 0.1–1.0)
Monocytes Relative: 18 %
Neutro Abs: 1.3 10*3/uL — ABNORMAL LOW (ref 1.7–7.7)
Neutrophils Relative %: 44 %
Platelets: 110 10*3/uL — ABNORMAL LOW (ref 150–400)
RBC: 2.38 MIL/uL — ABNORMAL LOW (ref 4.22–5.81)
RDW: 26.5 % — ABNORMAL HIGH (ref 11.5–15.5)
WBC: 3 10*3/uL — ABNORMAL LOW (ref 4.0–10.5)
nRBC: 0 % (ref 0.0–0.2)

## 2022-04-01 LAB — COMPREHENSIVE METABOLIC PANEL
ALT: 27 U/L (ref 0–44)
AST: 19 U/L (ref 15–41)
Albumin: 2.8 g/dL — ABNORMAL LOW (ref 3.5–5.0)
Alkaline Phosphatase: 61 U/L (ref 38–126)
Anion gap: 4 — ABNORMAL LOW (ref 5–15)
BUN: 18 mg/dL (ref 8–23)
CO2: 24 mmol/L (ref 22–32)
Calcium: 8.7 mg/dL — ABNORMAL LOW (ref 8.9–10.3)
Chloride: 107 mmol/L (ref 98–111)
Creatinine, Ser: 0.88 mg/dL (ref 0.61–1.24)
GFR, Estimated: >60 mL/min (ref >60)
Glucose, Bld: 109 mg/dL — ABNORMAL HIGH (ref 70–99)
Potassium: 3.7 mmol/L (ref 3.5–5.1)
Sodium: 135 mmol/L (ref 135–145)
Total Bilirubin: 0.5 mg/dL (ref 0.3–1.2)
Total Protein: 8.6 g/dL — ABNORMAL HIGH (ref 6.5–8.1)

## 2022-04-01 LAB — MAGNESIUM: Magnesium: 2 mg/dL (ref 1.7–2.4)

## 2022-04-01 MED ORDER — SODIUM CHLORIDE 0.9% FLUSH
10.0000 mL | INTRAVENOUS | Status: DC | PRN
  Administered 2022-04-01: 10 mL

## 2022-04-01 MED ORDER — SODIUM CHLORIDE 0.9 % IV SOLN
800.0000 mg | Freq: Once | INTRAVENOUS | Status: AC
  Administered 2022-04-01: 800 mg via INTRAVENOUS
  Filled 2022-04-01: qty 20

## 2022-04-01 MED ORDER — ONDANSETRON HCL 4 MG/2ML IJ SOLN
8.0000 mg | Freq: Once | INTRAMUSCULAR | Status: AC
  Administered 2022-04-01: 8 mg via INTRAVENOUS
  Filled 2022-04-01: qty 4

## 2022-04-01 MED ORDER — SODIUM CHLORIDE 0.9 % IV SOLN: Freq: Once | INTRAVENOUS | Status: AC

## 2022-04-01 MED ORDER — BORTEZOMIB CHEMO SQ INJECTION 3.5 MG (2.5MG/ML)
1.0000 mg/m2 | Freq: Once | INTRAMUSCULAR | Status: AC
  Administered 2022-04-01: 1.75 mg via SUBCUTANEOUS
  Filled 2022-04-01: qty 0.7

## 2022-04-01 MED ORDER — HEPARIN SOD (PORK) LOCK FLUSH 100 UNIT/ML IV SOLN
500.0000 [IU] | Freq: Once | INTRAVENOUS | Status: AC | PRN
  Administered 2022-04-01: 500 [IU]

## 2022-04-01 MED ORDER — ACETAMINOPHEN 325 MG PO TABS
650.0000 mg | ORAL_TABLET | Freq: Once | ORAL | Status: AC
  Administered 2022-04-01: 650 mg via ORAL
  Filled 2022-04-01: qty 2

## 2022-04-01 MED ORDER — SODIUM CHLORIDE 0.9 % IV SOLN
16.0000 mg/kg | Freq: Once | INTRAVENOUS | Status: DC
  Filled 2022-04-01: qty 45

## 2022-04-01 MED ORDER — DIPHENHYDRAMINE HCL 25 MG PO CAPS
25.0000 mg | ORAL_CAPSULE | Freq: Once | ORAL | Status: AC
  Administered 2022-04-01: 25 mg via ORAL
  Filled 2022-04-01: qty 1

## 2022-04-01 MED ORDER — METHYLPREDNISOLONE SODIUM SUCC 125 MG IJ SOLR
100.0000 mg | Freq: Once | INTRAMUSCULAR | Status: AC
  Administered 2022-04-01: 100 mg via INTRAVENOUS
  Filled 2022-04-01: qty 2

## 2022-04-01 NOTE — Progress Notes (Signed)
Patient has been assessed, vital signs and labs have been reviewed by Dr. Katragadda. ANC, Creatinine, LFTs, and Platelets are within treatment parameters per Dr. Katragadda. The patient is good to proceed with treatment at this time. Primary RN and pharmacy aware.  

## 2022-04-01 NOTE — Progress Notes (Signed)
Message received from Sylvester Harder RN / Dr. Delton Coombes to proceed with treatment. ANC 1.3 and MD aware and orders received to proceed with treatment today . Rapid Daratumumab (Darzalex) 900 mg.

## 2022-04-01 NOTE — Patient Instructions (Addendum)
[facility]  Discharge Instructions  You were seen and examined today by Dr. Delton Coombes.  Your M-Spike (Myeloma Number) has increased. Dr. Delton Coombes has recommended going to weekly infusion AND injections for greater response.  We will continue to keep his Hemoglobin above 8, that seems to do better for you.  Follow-up as scheduled.  Thank you for choosing [facility] to provide your oncology and hematology care.   To afford each patient quality time with our provider, please arrive at least 15 minutes before your scheduled appointment time. You may need to reschedule your appointment if you arrive late (10 or more minutes). Arriving late affects you and other patients whose appointments are after yours.  Also, if you miss three or more appointments without notifying the office, you may be dismissed from the clinic at the provider's discretion.    Again, thank you for choosing [facility].  Our hope is that these requests will decrease the amount of time that you wait before being seen by our physicians.   If you have a lab appointment with the [facility] please come in thru the Main Entrance and check in at the main information desk.           _____________________________________________________________  Should you have questions after your visit to [facility], please contact our office at [phone] and follow the prompts.  Our office hours are 8:00 a.m. to 4:30 p.m. Monday - Thursday and 8:00 a.m. to 2:30 p.m. Friday.  Please note that voicemails left after 4:00 p.m. may not be returned until the following business day.  We are closed weekends and all major holidays.  You do have access to a nurse 24-7, just call the main number to the clinic [phone] and do not press any options, hold on the line and a nurse will answer the phone.    For prescription refill requests, have your pharmacy contact  our office and allow 72 hours.    Masks are optional in the cancer centers. If you would like for your care team to wear a mask while they are taking care of you, please let them know. You may have one support person who is at least 86 years old accompany you for your appointments.

## 2022-04-01 NOTE — Progress Notes (Signed)
Doses updated to rapid Darzalex. Ok per MD.  Give 800 mg dose of Darzalex for today.  Pharmacy only has 800 mg on hand.  T.O. Dr Rhys Martini, PharmD

## 2022-04-01 NOTE — Progress Notes (Signed)
Patient presents today for Darzalex infusion and Velcade injection per providers order.  Vital signs within parameters for treatment.  Labs pending.  Message received from Adonis Huguenin RN/Dr. Delton Coombes patient okay for treatment pending labs.  Darzalex is being changed to rapid infusion per providers order.  Treatment given today per MD orders.  Stable during infusion without adverse affects.  Velcade administration without incident; injection site WNL; see MAR for injection details.  Patient tolerated procedure well and without incident.  Vital signs stable.  No complaints at this time.  Discharge from clinic ambulatory in stable condition.  Alert and oriented X 3.  Follow up with [facility] as scheduled.

## 2022-04-01 NOTE — Patient Instructions (Signed)
[facility]  Discharge Instructions: Thank you for choosing [facility] to provide your oncology and hematology care.  If you have a lab appointment with the [facility], please come in thru the Main Entrance and check in at the main information desk.  Wear comfortable clothing and clothing appropriate for easy access to any Portacath or PICC line.   We strive to give you quality time with your provider. You may need to reschedule your appointment if you arrive late (15 or more minutes).  Arriving late affects you and other patients whose appointments are after yours.  Also, if you miss three or more appointments without notifying the office, you may be dismissed from the clinic at the provider's discretion.      For prescription refill requests, have your pharmacy contact our office and allow 72 hours for refills to be completed.    Today you received the following chemotherapy and/or immunotherapy agents Velcade Darzalex      To help prevent nausea and vomiting after your treatment, we encourage you to take your nausea medication as directed.  BELOW ARE SYMPTOMS THAT SHOULD BE REPORTED IMMEDIATELY: *FEVER GREATER THAN 100.4 F (38 C) OR HIGHER *CHILLS OR SWEATING *NAUSEA AND VOMITING THAT IS NOT CONTROLLED WITH YOUR NAUSEA MEDICATION *UNUSUAL SHORTNESS OF BREATH *UNUSUAL BRUISING OR BLEEDING *URINARY PROBLEMS (pain or burning when urinating, or frequent urination) *BOWEL PROBLEMS (unusual diarrhea, constipation, pain near the anus) TENDERNESS IN MOUTH AND THROAT WITH OR WITHOUT PRESENCE OF ULCERS (sore throat, sores in mouth, or a toothache) UNUSUAL RASH, SWELLING OR PAIN  UNUSUAL VAGINAL DISCHARGE OR ITCHING   Items with * indicate a potential emergency and should be followed up as soon as possible or go to the Emergency Department if any problems should occur.  Please show the CHEMOTHERAPY ALERT CARD or IMMUNOTHERAPY ALERT CARD at check-in to the  Emergency Department and triage nurse.  Should you have questions after your visit or need to cancel or reschedule your appointment, please contact [facility] [phone]  and follow the prompts.  Office hours are 8:00 a.m. to 4:30 p.m. Monday - Friday. Please note that voicemails left after 4:00 p.m. may not be returned until the following business day.  We are closed weekends and major holidays. You have access to a nurse at all times for urgent questions. Please call the main number to the clinic [phone] and follow the prompts.  For any non-urgent questions, you may also contact your provider using MyChart. We now offer e-Visits for anyone 3 and older to request care online for non-urgent symptoms. For details visit [email or web address].   Also download the MyChart app! Go to the app store, search "MyChart", open the app, select [facility], and log in with your MyChart username and password.  Masks are optional in the cancer centers. If you would like for your care team to wear a mask while they are taking care of you, please let them know. You may have one support person who is at least 86 years old accompany you for your appointments.

## 2022-04-03 ENCOUNTER — Encounter: Payer: Self-pay | Admitting: Cardiovascular Disease

## 2022-04-03 ENCOUNTER — Ambulatory Visit: Payer: Medicare Other | Attending: Cardiovascular Disease | Admitting: Cardiovascular Disease

## 2022-04-03 VITALS — BP 158/62 | HR 62 | Ht 68.0 in | Wt 129.0 lb

## 2022-04-03 DIAGNOSIS — C9000 Multiple myeloma not having achieved remission: Secondary | ICD-10-CM | POA: Diagnosis not present

## 2022-04-03 DIAGNOSIS — I25119 Atherosclerotic heart disease of native coronary artery with unspecified angina pectoris: Secondary | ICD-10-CM

## 2022-04-03 DIAGNOSIS — I10 Essential (primary) hypertension: Secondary | ICD-10-CM

## 2022-04-03 DIAGNOSIS — E785 Hyperlipidemia, unspecified: Secondary | ICD-10-CM | POA: Diagnosis not present

## 2022-04-03 DIAGNOSIS — R011 Cardiac murmur, unspecified: Secondary | ICD-10-CM | POA: Diagnosis not present

## 2022-04-03 DIAGNOSIS — I2510 Atherosclerotic heart disease of native coronary artery without angina pectoris: Secondary | ICD-10-CM | POA: Diagnosis not present

## 2022-04-03 DIAGNOSIS — E039 Hypothyroidism, unspecified: Secondary | ICD-10-CM | POA: Diagnosis not present

## 2022-04-03 NOTE — Progress Notes (Unsigned)
Patient ID: Dale [name], male   DOB: 1929-04-09, 88 y.o.   MRN: [id number]      Primary M.D.: Dr. Redmond School  HPI: Dale [name] is a 88 y.o. male who presents to the office for a 12 month cardiology evaluation.  [name] has known CAD and underwent initial intervention to his RCA in 2000. In 2009 a Cypher stent was placed beyond the acute margin of his RCA. His last cardiac catheterization was in September 2013 by Dr. Gwenlyn [name] which showed 30% LAD narrowing after the second diagonal vessel, and normal left circumflex coronary artery, and patent RCA stents.  He has a history of hypothyroidism on Synthroid replacement and has a history of hyperlipidemia. He had been on on Crestor 5 mg plus Zetia 10 mg; however, due to recent increased cost of Zetia, this was discontinued and he has been on Crestor 20 mg daily.  He also has a history of GERD treated with Protonix, and he continues to be on dual antiplatelet therapy. He is responsive to Plavix on previous P2 Y12 testing.  When I saw him in 2016 he had experienced 3-4 episodes of chest pain over the six-month period.  Most of these episodes have occurred at night and were nitrate responsive.  He states that they were similar to his previous discomfort.  When I saw him, I further titrated his isosorbide mononitrate to 60 mg in the morning and 30 mg at night.   He saw [name], [place] in August 2018 with weight loss, weakness, and vague chest pain.  He felt his chest pain was atypical and not ischemic.  An echo Doppler study on 12/23/2016 showed an EF of 55-60%.  He was hospitalized on August 29 through January 03, 2017 at  [facility].  Apparently he had a nuclear stress test which did not reveal any reversible ischemia.  He was also felt to have acute sinusitis for which he was treated with antibiotics.  He has a history of renal cell CA.Marland Kitchen     When I saw him in October 2018, his chest pain had resolved. He underwent a carotid duplex study in October 2018  which showed mild plaque bilaterally at 1-39%. He denies increasing episodes of chest pain.  At times he notes rare chest discomfort which is short-lived.  He had developed some gross hematuria and was [name] to have a kidney stone which he underwent cystoscopy, left ureteral pyelogram with fluoroscopic interpretation, left ureteroscopy, holmium laser lithotripsy and extraction of left mid ureteral stone, placement of 24 x 6 French contour double-J stent, without tether by Dr [name] on 06/16/2017.  I saw him in March 2019.  Since that time, he has been seen by [name], PAC on 2 occasions in October.  He was seen initially on February 03, 2018 for bilateral ankle edema at which time his hydrochlorothiazide was switched to Lasix.  When seen in follow-up he continued to have ankle edema and Lasix was further titrated to 40 mg and potassium supplementation was increased.  I saw him in January 2020.  Several weeks prior to that evaluation he had experienced an episode of sharp chest ache which was nonexertional.    He denied recurrent anginal type symptoms.  He was sleepy during the day but admits that he is sleeping well at night.  He typically goes to bed at 11 PM and wakes up at 7 AM.    When I saw him in May 2020, he had noticed progressive  lower extremity leg edema for several weeks previously.  He apparently was not taking his Lasix in the morning but had been taking this at 5 PM.  He denied chest pain, awareness of abnormal heart rhythm, PND orthopnea.  During that evaluation, I suggested that he take his Lasix 40 mg in the morning and for the next several days take 20 mg in the later afternoon and if edema persisted to continue to take the Lasix perhaps every other day in the afternoon as well.  He states typically he does not take his Lasix in the morning but typically takes his 40 mg at lunchtime.  He then waits till suppertime around 7 PM to take an additional 40 mg of Lasix which he has been doing on  most days.  He urinates until 10 PM.  He has not been taking the Lasix as soon as he wakes up because then he states he cannot do anything without only he continues to experience ankle swelling bilaterally left greater than right.  He has not been using support stockings.  At times he may notice a mild arrhythmia.  He denied presyncope or syncope or recurrent anginal symptoms.  During that evaluation, I recommended that he continue to use support stockings.  Due to his frequent PVCs noted on ECG I slightly titrated his metoprolol from 12.5 mg twice a day to 25 mg in the morning and 12.5 mg at night.    When I saw him in November 2020 he felt improved from his July 2020 evaluation  with titration of beta-blocker therapy and noticed resolution of his previous sense of palpitations.  His peripheral edema has improved with reinstitution of support stockings.  He denied any recurrent anginal symptoms.  He denied any significant shortness of breath.  I saw him in June 2021 at which time he admitted to vague chest pain which has been nitrate responsive.  His peripheral edema had improved with support stockings.  At that tim he denied any presyncope or syncope.  He has continued to be on DAPT with aspirin/Plavix.  He is on furosemide 40 mg, losartan 100 mg and metoprolol tartrate 25 mg in the morning and 12.5 mg in the evening.  He has been taking isosorbide 60 mg in the morning and 30 mg at night.  During that evaluation I recommended slight additional titration of isosorbide to 90 mg in the morning and 30 mg at night.  Apparently in September 2021 he developed a syncopal spell he was sitting in the chair.  His wife gave him a dose of nitroglycerin and he subsequently passed out.  Pressure on EMS arrival was 89/65, CBG 112.  He was given 40 of cc of IV fluid prior to ER arrival.  This was also the first day he had taken alfuzosin prescribed for his urination.  He had not taken his medicine again.  He was seen by [name], PA in follow-up and was stable.  I last saw him on Sep 27, 2020 when he came to the office with his son.  His son believes that the patient may have run out of his furosemide January.  The patient is unaware of.  They have also noticed some short-term memory loss and were concerned of whether or not he had a stroke.  He specifically denied any weakness or focal neurologic symptoms.  He has been on aspirin/Plavix for DAPT, atorvastatin 40 mg for hyperlipidemia, isosorbide 90 mg in the morning, 30 mg at night, losartan  100 mg in the morning, metoprolol tartrate 25 mg in the morning and 12.5 mg at night.  On his formulary it still says furosemide 40 mg daily, but the son believes he ran out of this.    Since his May 2022 evaluation, he went to the emergency room on January 04, 2021 after developing syncope while taking a hot shower.  When EMS arrived they stood the patient up and he became bradycardic, pale, diaphoretic and had another syncopal episode.  ECG showed first-degree AV block.  Laboratory revealed normal serial troponins, BMP, D-dimer and CTA was negative for PE or acute pulmonary issues. His electrolytes were normal.  CT of his head and neck were negative for acute injury.  Right upper quadrant ultrasound was negative.  He was treated with intravenous fluids.  Several days later he represented to the ED on January 07, 2021 with leg swelling was treated for cellulitis of the right leg with doxycycline 100 mg twice a day for 7 days.  He was seen on January 16, 2021 by [name], PA-C for preoperative clearance prior to hernia repair by [facility] surgery.  At that time he continued to have right lower extremity edema with significant resolution of his prior erythema.  He was diagnosed with plasma cell myeloma based on a bone marrow biopsy in August 2022 and is followed by Dr. Delton Coombes.  Skeletal survey was negative for lytic lesions.  At that time, he was having thoughts regarding his  hernia surgery and ultimately preferred to defer this.  I last saw him on March 28, 2021 at which time he felt well and denied chest pain.  On November 18 he had another presyncopal spell and had fallen.  He still noted some right ankle swelling but his lower extremity was significantly improved.  He denied chest pain, PND orthopnea.  He had undergone an echo Doppler study on February 08, 2019 which showed hyperdynamic LV function with EF 65 to 70%, mild MR, mild TR and aortic valve was felt to be tricuspid but there was no mention of sclerosis or stenosis.   He is in the office today, April 03, 2022, with his son.  Since I last saw him, he has now been undergoing weekly infusion therapy for his multiple myeloma followed by Dr. Delton Coombes.  When they had extended his infusions every 2 weeks his hemoglobin decreased and M spike increased leading to resumption of weekly dosing.  He is no longer taking daily furosemide and takes this only on an as-needed basis.  He continues to be on metoprolol tartrate 25 mg in the morning and 12.5 mg at night in addition to isosorbide 90 mg in the morning and 30 mg at night.  He is on pantoprazole for GERD.  He also is on dexamethasone weekly.  He is on atorvastatin for hyperlipidemia.  He presents for reevaluation.   Past Medical History:  Diagnosis Date   Allergic rhinitis    Anal fissure    Anginal pain (HCC)    Asthma    Cancer (HCC)    skin   Chest pain    CHF (congestive heart failure) ([place])    Coronary heart disease    s/p stenting. cath in 01/2012 noncritical occlusion   Dyspnea    Dysrhythmia    1st degree heart block   GERD (gastroesophageal reflux disease)    Glaucoma    Hiatal hernia    Hyperlipidemia    Hypertension    Hypothyroidism  Idiopathic thrombocytopenic purpura ([place]) 2002   Macular degeneration    Nephrolithiasis    PUD (peptic ulcer disease)    remote   Sarcoidosis    pulmonary   Schatzki's ring     Past Surgical  History:  Procedure Laterality Date   cardiac stents     COLONOSCOPY  10/30/2006   Normal rectum, sigmoid diverticula.Remainder of colonic mucosa appeared normal.   CORONARY ANGIOPLASTY WITH STENT PLACEMENT     about 10 years ago per pt (around 2007)   [place], URETEROSCOPY AND STENT PLACEMENT Left 06/16/2017   Procedure: CYSTOSCOPY WITH RETROGRADE PYELOGRAM, URETEROSCOPY,STONE EXTRACTION  AND STENT PLACEMENT;  Surgeon: Franchot Gallo, MD;  Location: [facility];  Service: Urology;  Laterality: Left;   ESOPHAGOGASTRODUODENOSCOPY  06/19/2004   Two esophageal rings and esophageal web as described above.  All of these were disrupted by passing 56-French Venia Minks dilator/ Candida esophagitis,which appears to be incidental given history of   antibiotic use, but nevertheless will be treated.   ESOPHAGOGASTRODUODENOSCOPY  10/30/2006   Distal tandem esophageal ring status post dilation disruption as  described above.  Otherwise normal esophagus/  Small hiatal hernia otherwise normal stomach, D1 and D2   ESOPHAGOGASTRODUODENOSCOPY N/A 03/22/2015   Dr.Rourk- noncritical schatzki's ring and hiatal hernia-o/w normal EGD.    ESOPHAGOGASTRODUODENOSCOPY (EGD) WITH ESOPHAGEAL DILATION  03/04/2012   RMR- schatzki's ring, hiatal hernia, polypoid gastric mucosa, bx= minimally active gastritis.   HOLMIUM LASER APPLICATION Left 07/19/1759   Procedure: HOLMIUM LASER APPLICATION;  Surgeon: Franchot Gallo, MD;  Location: [facility];  Service: Urology;  Laterality: Left;   IR CV LINE INJECTION  12/27/2021   IR GENERIC HISTORICAL  03/06/2016   IR RADIOLOGIST EVAL & MGMT 03/06/2016 Aletta Edouard, MD [facility] INTERV RAD   IR GENERIC HISTORICAL  06/18/2016   IR RADIOLOGIST EVAL & MGMT 06/18/2016 Aletta Edouard, MD [facility] INTERV RAD   IR IMAGING GUIDED PORT INSERTION  12/18/2021   IR IMAGING GUIDED PORT INSERTION  01/15/2022   IR RADIOLOGIST EVAL & MGMT  10/01/2016   IR RADIOLOGIST EVAL & MGMT   10/15/2017   IR RADIOLOGIST EVAL & MGMT  12/24/2018   IR RADIOLOGIST EVAL & MGMT  01/04/2020   IR RADIOLOGIST EVAL & MGMT  06/20/2021   IR REMOVAL TUN ACCESS W/ PORT W/O FL MOD SED  01/15/2022   LEFT HEART CATH N/A 02/02/2012   Procedure: LEFT HEART CATH;  Surgeon: Lorretta Harp, MD;  Location: [facility] CATH LAB;  Service: Cardiovascular;  Laterality: N/A;   MEDIASTINOSCOPY     for dx sarcoid   RADIOLOGY WITH ANESTHESIA Left 05/17/2016   Procedure: left renal ablation;  Surgeon: Aletta Edouard, MD;  Location: [facility];  Service: Radiology;  Laterality: Left;    Allergies  Allergen Reactions   Azithromycin Other (See Comments)    Sore mouth and fever blisters around mouth, sores in nose area as well   Doxazosin Shortness Of Breath   Atenolol Other (See Comments)    UNKNOWN REACTION   Hydrocodone Nausea And Vomiting   Levofloxacin Other (See Comments)    Caused stomach problems.   Morphine Other (See Comments)    "made me crazy"   Penicillins Nausea And Vomiting and Other (See Comments)    Has patient had a PCN reaction causing immediate rash, facial/tongue/throat swelling, SOB or lightheadedness with hypotension: No Has patient had a PCN reaction causing severe rash involving mucus membranes or skin necrosis: No Has patient had a PCN reaction that required  hospitalization No Has patient had a PCN reaction occurring within the last 10 years: No If all of the above answers are "NO", then may proceed with Cephalosporin use.    Sulfonamide Derivatives Nausea And Vomiting    Current Outpatient Medications  Medication Sig Dispense Refill   acetaminophen (TYLENOL) 325 MG tablet Take 2 tablets (650 mg total) by mouth every 6 (six) hours as needed for mild pain (or Fever >/= 101).     acyclovir (ZOVIRAX) 400 MG tablet TAKE 1 TABLET BY MOUTH TWICE  DAILY 180 tablet 0   Ascorbic Acid (VITAMIN C PO) Take 500 mg by mouth every evening.     atorvastatin (LIPITOR) 40 MG tablet TAKE 1 TABLET BY MOUTH IN   THE EVENING (Patient taking differently: Take 40 mg by mouth daily.) 90 tablet 3   Bortezomib (VELCADE IJ) Inject as directed once a week.     brimonidine (ALPHAGAN) 0.2 % ophthalmic solution Place 1 drop into the left eye 2 (two) times daily.     cyanocobalamin 1000 MCG tablet Take 1 tablet (1,000 mcg total) by mouth daily. 30 tablet 3   dexamethasone (DECADRON) 2 MG tablet TAKE 5 TABLETS BY MOUTH ONCE  WEEKLY 60 tablet 0   dorzolamide-timolol (COSOPT) 22.3-6.8 MG/ML ophthalmic solution Place 1 drop into the left eye 2 (two) times daily.     feeding supplement (ENSURE ENLIVE / ENSURE PLUS) LIQD Take 237 mLs by mouth 3 (three) times daily between meals.     isosorbide mononitrate (IMDUR) 60 MG 24 hr tablet Take 1 tablet (60 mg total) by mouth daily. TAKE 1 AND 1/2 TABLETS BY  MOUTH IN THE MORNING AND  1/2 TABLET AT NIGHT     levothyroxine (SYNTHROID) 75 MCG tablet Take 75 mcg by mouth daily before breakfast.     lidocaine-prilocaine (EMLA) cream Apply 1 Application topically as needed. 30 g 1   losartan (COZAAR) 100 MG tablet TAKE 1 TABLET BY MOUTH  DAILY (Patient taking differently: Take 100 mg by mouth daily.) 90 tablet 1   metoprolol tartrate (LOPRESSOR) 25 MG tablet TAKE 1 TABLET BY MOUTH IN  THE MORNING AND ONE-HALF  TABLET BY MOUTH IN THE  EVENING (Patient taking differently: Take 12.5-25 mg by mouth in the morning and at bedtime. TAKE 1 TABLET BY MOUTH IN  THE MORNING AND ONE-HALF  TABLET BY MOUTH IN THE  EVENING) 135 tablet 3   Multiple Vitamins-Minerals (PRESERVISION/LUTEIN) CAPS Take 1 capsule by mouth 2 (two) times daily.     nitroGLYCERIN (NITROSTAT) 0.4 MG SL tablet Place 1 tablet (0.4 mg total) under the tongue every 5 (five) minutes as needed. For chest pain 25 tablet 2   pantoprazole (PROTONIX) 40 MG tablet TAKE 1 TABLET BY MOUTH DAILY  BEFORE BREAKFAST 90 tablet 3   potassium chloride SA (KLOR-CON M) 20 MEQ tablet TAKE 1 TABLET BY MOUTH  DAILY (Patient taking differently: Take 20 mEq  by mouth daily.) 90 tablet 3   prochlorperazine (COMPAZINE) 10 MG tablet Take 1 tablet (10 mg total) by mouth every 6 (six) hours as needed for nausea or vomiting. 30 tablet 3   ROCKLATAN 0.02-0.005 % SOLN Place 1 drop into the left eye at bedtime.     triamcinolone (KENALOG) 0.1 % cream Apply 1 application. topically 2 (two) times daily as needed (for irritation).     vitamin E 200 UNIT capsule Take 200 Units by mouth every evening.     No current facility-administered medications for this visit.  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    Employer: RETIRED  Tobacco Use   Smoking status: Former    Packs/day: 0.10    Years: 2.00    Total pack years: 0.20    Types: Cigarettes, Cigars    Quit date: 05/06/1970    Years since quitting: 51.9   Smokeless tobacco: Never  Vaping Use   Vaping Use: Never used  Substance and Sexual Activity   Alcohol use: No    Alcohol/week: 0.0 standard drinks of alcohol   Drug use: No   Sexual activity: Never  Other Topics Concern   Not on file  Social History Narrative   Not on file   Social Determinants of Health   Financial Resource Strain: Not on file  Food Insecurity: No Food Insecurity (02/06/2022)   Hunger Vital Sign    Worried About Running Out of Food in the Last Year: Never true    Ran Out of Food in the Last Year: Never true  Transportation Needs: No Transportation Needs (02/06/2022)   PRAPARE - Radiographer, therapeutic (Medical): No    Lack of Transportation (Non-Medical): No  Physical Activity: Not on file  Stress: Not on file  Social Connections: Not on file  Intimate Partner Violence: Not At Risk (02/06/2022)   Humiliation, Afraid, Rape, and Kick questionnaire    Fear of Current or Ex-Partner: No    Emotionally Abused: No    Physically Abused: No    Sexually Abused: No   Additional social history is notable that he is married and lives with his wife. He quit smoking over 40 years ago. Has one child. He remains active.  Family History  Problem Relation Age of Onset   Heart disease Father        deceased age 31   Stroke Mother    Alzheimer's disease Mother    Heart attack Brother        deceased age 11   Cancer Other        niece   Colon cancer Neg Hx    ROS General: Negative; No fevers, chills, or night sweats; Positive for weight loss HEENT: Positive for recent sinusitis.  No changes in vision., difficulty swallowing Pulmonary: Negative; No cough, wheezing, shortness of breath, hemoptysis Cardiovascular: See history of present illness GI: Negative; No nausea, vomiting, diarrhea, or abdominal pain GU: History of renal cell CA status post cryoablation; status post renal stone removal Musculoskeletal: Negative; no myalgias, joint pain, or weakness Hematologic/Oncology: Recently diagnosed plasma cell myeloma Endocrine: Positive for hypothyroidism; no diabetes Neuro: Negative; no changes in balance, headaches Skin: Negative; No rashes or skin lesions Psychiatric: Negative; No behavioral problems, depression Sleep: Negative; No snoring, daytime sleepiness, hypersomnolence, bruxism, restless legs, hypnogognic hallucinations, no cataplexy Other comprehensive 14 point system review is  negative.   PE BP (!) 158/62   Pulse 62   Ht _0  (1.727 m)   Wt 129 lb (58.5 kg)   SpO2 100%   BMI 19.61 kg/m    Repeat blood pressure by me was 140/68  Wt Readings from Last 3  Encounters:  04/03/22 129 lb (58.5 kg)  04/01/22 125 lb (56.7 kg)  03/14/22 123 lb 3.2 oz (55.9 kg)   General: Alert, oriented, no distress.  Skin: normal turgor, no rashes, warm and dry HEENT: Normocephalic, atraumatic. Pupils equal round and reactive to light; sclera anicteric; extraocular muscles intact;  Nose without nasal septal hypertrophy Mouth/Parynx benign; Mallinpatti scale 2/3 Neck: No JVD, no carotid bruits; normal carotid upstroke Lungs: clear to ausculatation and percussion; no wheezing or rales Chest wall: without tenderness to palpitation Heart: PMI not displaced, RRR, s1 s2 normal, 2/6 systolic murmur in the aortic position, faint diastolic murmur, no rubs, gallops, thrills, or heaves Abdomen: soft, nontender; no hepatosplenomehaly, BS+; abdominal aorta nontender and not dilated by palpation. Back: no CVA tenderness Pulses 2+ Musculoskeletal: full range of motion, normal strength, no joint deformities Extremities: no clubbing cyanosis or edema, Homan's sign negative  Neurologic: grossly nonfocal; Cranial nerves grossly wnl Psychologic: Normal mood and affect    April 03, 2022 ECG (independently read by me):  Possible low arial rhythm at 62; No STT changes  March 28, 2021 ECG (independently read by me):  NSR at 73; ; QTc 416 msec; no ectopy  Sep 27, 2020 ECG (independently read by me): Normal sinus rhythm at 70, borderline first-degree AV block with a PR interval of 208 ms.  No ectopy.  June 2021 ECG (independently read by me): Normal sinus rhythm at 60 bpm, first-degree AV block.  PR nterval to 244 ms  March 18, 2019 ECG (independently read by me): Normal sinus rhythm at 62 bpm.  First-degree AV block with a parable 244 ms.  1 isolated PVC.  No ST segment change  July 2020 ECG (independently read by me):Sinus rhythm at 68 bpm with first-degree AV block ( PR interval 236 msec) and frequent unifocal PVCs  Sep 09, 2018 ECG (independently read by me): Sinus  bradycardia with first-degree AV block with ventricular rate of 59 bpm.  PR interval 244 ms.  June 02, 2018 ECG (independently read by me): Sinus rhythm at 62 bpm with first-degree block with apparent of what to 40 ms.  No significant ST-T changes.  July 24, 2017 ECG (independently read by me): normal sinus rhythm at 60 bpm with first-degree AV block.  PR interval 236 ms.  QTc interval normal.  No ectopy.  October 2018 ECG (independently read by me): Sinus bradycardia 50 bpm.  First degree AV block with a PR interval at 224 ms.  No syncope ST changes.  September 2017 ECG (independently read by me): Sinus bradycardia 59 bpm.  First-degree AV block with PR interval 240 ms.  No significant ST-T changes.  November 2016 ECG (independently read by me): Sinus bradycardia at 55 beats per minute with first-degree AV block.  PR interval 246 ms.  No significant ST-T changes.  May 2016 ECG (independently read by me): Sinus rhythm with first-degree heart block.  PR interval 226.  No ST segment changes.  February 2016 ECG (independently read by me): Sinus rhythm at 65 bpm.  Mild first-degree AV block with a PR interval 228 ms.  No significant ST segment changes.  December 2015 ECG (independently read by me): Sinus bradycardia 57 bpm.  Borderline first-degree AV block with a PR interval 216 daily.  Seconds.  QTc interval is normal at 373 ms.  June 2015 ECG (independently read by me): Sinus bradycardia 54 beats per minute.  First degree block with a PR interval of 232 ms.  No significant ST-T changes.  Prior ECG: Sinus bradycardia 54 beats per minute. PR interval 206 ms. QTc interval normal  LABS:    Latest Ref Rng & Units 04/01/2022    8:01 AM 03/14/2022    8:30 AM 02/28/2022    8:00 AM  BMP  Glucose 70 - 99 mg/dL 109  109  114   BUN 8 - 23 mg/dL _0 Creatinine 0.61 - 1.24 mg/dL 0.88  0.85  0.83   Sodium 135 - 145 mmol/L 135  134  135   Potassium 3.5 - 5.1 mmol/L 3.7  4.0  3.8    Chloride 98 - 111 mmol/L 107  107  107   CO2 22 - 32 mmol/L _1 Calcium 8.9 - 10.3 mg/dL 8.7  8.7  8.5       Latest Ref Rng & Units 04/01/2022    8:01 AM 03/14/2022    8:30 AM 02/28/2022    8:00 AM  Hepatic Function  Total Protein 6.5 - 8.1 g/dL 8.6  8.8  8.7   Albumin 3.5 - 5.0 g/dL 2.8  2.7  2.6   AST 15 - 41 U/L _2 ALT 0 - 44 U/L _3 Alk Phosphatase 38 - 126 U/L 61  56  57   Total Bilirubin 0.3 - 1.2 mg/dL 0.5  0.7  0.7       Latest Ref Rng & Units 04/01/2022    8:01 AM 03/26/2022    9:52 AM 03/20/2022   10:12 AM  CBC  WBC 4.0 - 10.5 K/uL 3.0  3.3  3.5   Hemoglobin 13.0 - 17.0 g/dL 8.0  8.6  7.8   Hematocrit 39.0 - 52.0 % 24.3  25.7  23.9   Platelets 150 - 400 K/uL 110  75  88    Lab Results  Component Value Date   MCV 102.1 (H) 04/01/2022   MCV 100.0 03/26/2022   MCV 105.3 (H) 03/20/2022   Lab Results  Component Value Date   TSH 3.010 10/26/2019  No results [name] for: "HGBA1C"   Lipid Panel     Component Value Date/Time   CHOL 123 10/26/2019 1013   TRIG 107 10/26/2019 1013   HDL 41 10/26/2019 1013   CHOLHDL 3.0 10/26/2019 1013   CHOLHDL 2.6 01/02/2017 0612   VLDL 10 01/02/2017 0612   LDLCALC 62 10/26/2019 1013   RADIOLOGY: No results [name].  IMPRESSION:  1. Coronary artery disease involving native coronary artery of native heart without angina pectoris   2. Essential hypertension   3. Hyperlipidemia LDL goal <70   4. Systolic murmur   5. Multiple myeloma, remission status unspecified ([place])   6. Hypothyroidism, unspecified type     ASSESSMENT AND PLAN: Mr. Lion Fernandez is a 88 years old gentleman who has known CAD and underwent initial intervention to his RCA in 2000 and subsequent intervention in 2009. At last catheterization in 2013 his RCA stents were patent and he had mild 30% LAD narrowing.  He has had leg edema issues which resulted in changing his HCTZ to furosemide in the past.  He had  issues with edema, and has  had presyncope/syncope as well as some previous chest pain which had responded to nitrates.  Since his prior visits, his leg edema has completely resolved and he is no longer taking furosemide daily but takes this on an as-needed basis.  Previously, I had reduced his isosorbide with an episode of presyncope.  I reviewed his most recent echo Doppler study from February 07, 2022 which showed normal LV size and function.  Atrium are normal.  There was mild mitral regurgitation, mild tricuspid regurgitation.  His aortic valve was tricuspid and there was no mention of aortic stenosis or sclerosis.  He is now followed closely by Dr. Delton Coombes for his multiple myeloma and is undergoing weekly infusions.  He is on atorvastatin 40 mg daily for hyperlipidemia.  He continues to be on levothyroxine 75 mcg for hypothyroidism.  I will see him in 6 months for follow-up evaluation or sooner as needed.   Troy Sine, MD, [facility]  04/04/2022 4:05 PM

## 2022-04-03 NOTE — Patient Instructions (Signed)
Medication Instructions:  Your physician recommends that you continue on your current medications as directed. Please refer to the Current Medication list given to you today.  *If you need a refill on your cardiac medications before your next appointment, please call your pharmacy*   Follow-Up: At [facility], you and your health needs are our priority.  As part of our continuing mission to provide you with exceptional heart care, we have created designated Provider Care Teams.  These Care Teams include your primary Cardiologist (physician) and Advanced Practice Providers (APPs -  Physician Assistants and Nurse Practitioners) who all work together to provide you with the care you need, when you need it.  We recommend signing up for the patient portal called "MyChart".  Sign up information is provided on this After Visit Summary.  MyChart is used to connect with patients for Virtual Visits (Telemedicine).  Patients are able to view lab/test results, encounter notes, upcoming appointments, etc.  Non-urgent messages can be sent to your provider as well.   To learn more about what you can do with MyChart, go to [email or web address].    Your next appointment:   6 month(s)  The format for your next appointment:   In Person  Provider:   Shelva Majestic, MD

## 2022-04-04 ENCOUNTER — Other Ambulatory Visit: Payer: Self-pay

## 2022-04-04 ENCOUNTER — Other Ambulatory Visit: Payer: Medicare Other

## 2022-04-04 ENCOUNTER — Encounter: Payer: Self-pay | Admitting: Cardiovascular Disease

## 2022-04-04 ENCOUNTER — Ambulatory Visit: Payer: Medicare Other

## 2022-04-04 ENCOUNTER — Ambulatory Visit: Payer: Medicare Other | Admitting: Hematology

## 2022-04-05 ENCOUNTER — Inpatient Hospital Stay: Payer: Medicare Other | Attending: Hematology | Admitting: Licensed Clinical Social Worker

## 2022-04-05 DIAGNOSIS — C9000 Multiple myeloma not having achieved remission: Secondary | ICD-10-CM | POA: Insufficient documentation

## 2022-04-05 DIAGNOSIS — Z5112 Encounter for antineoplastic immunotherapy: Secondary | ICD-10-CM | POA: Insufficient documentation

## 2022-04-05 DIAGNOSIS — D630 Anemia in neoplastic disease: Secondary | ICD-10-CM | POA: Insufficient documentation

## 2022-04-05 DIAGNOSIS — Z79899 Other long term (current) drug therapy: Secondary | ICD-10-CM | POA: Insufficient documentation

## 2022-04-05 NOTE — Progress Notes (Signed)
[place] CSW Progress Note  Holiday representative  received a message that pt's meal delivery has been discontinued.  Pt's spouse is disabled and the couple were reliant on the food delivery as they are both unable to prepare meals.  Email sent to [employer] who informed CSW their program has exhausted their funding and they are no longer able to provide meals to the community.    Per Diane at [employer], every pt on the program was sent a letter in October and another letter in November informing of the above with instructions to contact [facility] [phone]) to register for Meals on Wheels.  CSW spoke w/ pt's wife on the phone to inform of the above.  Contact details given to wife for [employer] who verbalized agreement to call and register for Meals on Wheels.  CSW to remain available as appropriate throughout duration of treatment to provide support.       Henriette Combs, LCSW

## 2022-04-09 ENCOUNTER — Inpatient Hospital Stay: Payer: Medicare Other

## 2022-04-09 VITALS — BP 124/56 | HR 63 | Temp 97.7°F | Resp 18

## 2022-04-09 DIAGNOSIS — Z5112 Encounter for antineoplastic immunotherapy: Secondary | ICD-10-CM | POA: Diagnosis not present

## 2022-04-09 DIAGNOSIS — D630 Anemia in neoplastic disease: Secondary | ICD-10-CM | POA: Diagnosis not present

## 2022-04-09 DIAGNOSIS — Z79899 Other long term (current) drug therapy: Secondary | ICD-10-CM | POA: Diagnosis not present

## 2022-04-09 DIAGNOSIS — C9000 Multiple myeloma not having achieved remission: Secondary | ICD-10-CM

## 2022-04-09 DIAGNOSIS — D696 Thrombocytopenia, unspecified: Secondary | ICD-10-CM

## 2022-04-09 DIAGNOSIS — D649 Anemia, unspecified: Secondary | ICD-10-CM

## 2022-04-09 LAB — MAGNESIUM: Magnesium: 2.1 mg/dL (ref 1.7–2.4)

## 2022-04-09 LAB — CBC WITH DIFFERENTIAL/PLATELET
Abs Immature Granulocytes: 0.06 10*3/uL (ref 0.00–0.07)
Basophils Absolute: 0 10*3/uL (ref 0.0–0.1)
Basophils Relative: 0 %
Eosinophils Absolute: 0.4 10*3/uL (ref 0.0–0.5)
Eosinophils Relative: 13 %
HCT: 22.7 % — ABNORMAL LOW (ref 39.0–52.0)
Hemoglobin: 7.2 g/dL — ABNORMAL LOW (ref 13.0–17.0)
Immature Granulocytes: 2 %
Lymphocytes Relative: 35 %
Lymphs Abs: 1 10*3/uL (ref 0.7–4.0)
MCH: 33.3 pg (ref 26.0–34.0)
MCHC: 31.7 g/dL (ref 30.0–36.0)
MCV: 105.1 fL — ABNORMAL HIGH (ref 80.0–100.0)
Monocytes Absolute: 0.5 10*3/uL (ref 0.1–1.0)
Monocytes Relative: 17 %
Neutro Abs: 1 10*3/uL — ABNORMAL LOW (ref 1.7–7.7)
Neutrophils Relative %: 33 %
Platelets: 107 10*3/uL — ABNORMAL LOW (ref 150–400)
RBC: 2.16 MIL/uL — ABNORMAL LOW (ref 4.22–5.81)
WBC: 2.9 10*3/uL — ABNORMAL LOW (ref 4.0–10.5)
nRBC: 0 % (ref 0.0–0.2)

## 2022-04-09 LAB — SAMPLE TO BLOOD BANK

## 2022-04-09 LAB — COMPREHENSIVE METABOLIC PANEL
ALT: 26 U/L (ref 0–44)
AST: 21 U/L (ref 15–41)
Albumin: 2.7 g/dL — ABNORMAL LOW (ref 3.5–5.0)
Alkaline Phosphatase: 64 U/L (ref 38–126)
Anion gap: 6 (ref 5–15)
BUN: 22 mg/dL (ref 8–23)
CO2: 23 mmol/L (ref 22–32)
Calcium: 8.7 mg/dL — ABNORMAL LOW (ref 8.9–10.3)
Chloride: 106 mmol/L (ref 98–111)
Creatinine, Ser: 0.84 mg/dL (ref 0.61–1.24)
GFR, Estimated: >60 mL/min (ref >60)
Glucose, Bld: 106 mg/dL — ABNORMAL HIGH (ref 70–99)
Potassium: 3.5 mmol/L (ref 3.5–5.1)
Sodium: 135 mmol/L (ref 135–145)
Total Bilirubin: 0.3 mg/dL (ref 0.3–1.2)
Total Protein: 8.7 g/dL — ABNORMAL HIGH (ref 6.5–8.1)

## 2022-04-09 LAB — TYPE AND SCREEN
ABO/RH(D): O POS
Antibody Screen: POSITIVE
DAT, IgG: NEGATIVE

## 2022-04-09 LAB — PREPARE RBC (CROSSMATCH)

## 2022-04-09 MED ORDER — HEPARIN SOD (PORK) LOCK FLUSH 100 UNIT/ML IV SOLN
500.0000 [IU] | Freq: Once | INTRAVENOUS | Status: AC | PRN
  Administered 2022-04-09: 500 [IU]

## 2022-04-09 MED ORDER — ONDANSETRON HCL 4 MG/2ML IJ SOLN
8.0000 mg | Freq: Once | INTRAMUSCULAR | Status: AC
  Administered 2022-04-09: 8 mg via INTRAVENOUS
  Filled 2022-04-09: qty 4

## 2022-04-09 MED ORDER — ACETAMINOPHEN 325 MG PO TABS
650.0000 mg | ORAL_TABLET | Freq: Once | ORAL | Status: AC
  Administered 2022-04-09: 650 mg via ORAL
  Filled 2022-04-09: qty 2

## 2022-04-09 MED ORDER — SODIUM CHLORIDE 0.9% FLUSH
10.0000 mL | INTRAVENOUS | Status: DC | PRN
  Administered 2022-04-09: 10 mL

## 2022-04-09 MED ORDER — SODIUM CHLORIDE 0.9% FLUSH: 10.0000 mL | Freq: Once | INTRAVENOUS | Status: DC

## 2022-04-09 MED ORDER — DIPHENHYDRAMINE HCL 25 MG PO CAPS
50.0000 mg | ORAL_CAPSULE | Freq: Once | ORAL | Status: AC
  Administered 2022-04-09: 50 mg via ORAL
  Filled 2022-04-09: qty 2

## 2022-04-09 MED ORDER — METHYLPREDNISOLONE SODIUM SUCC 125 MG IJ SOLR
100.0000 mg | Freq: Once | INTRAMUSCULAR | Status: AC
  Administered 2022-04-09: 100 mg via INTRAVENOUS
  Filled 2022-04-09: qty 2

## 2022-04-09 MED ORDER — SODIUM CHLORIDE 0.9% FLUSH
10.0000 mL | Freq: Once | INTRAVENOUS | Status: AC
  Administered 2022-04-09: 10 mL via INTRAVENOUS

## 2022-04-09 MED ORDER — BORTEZOMIB CHEMO SQ INJECTION 3.5 MG (2.5MG/ML)
1.0000 mg/m2 | Freq: Once | INTRAMUSCULAR | Status: AC
  Administered 2022-04-09: 1.75 mg via SUBCUTANEOUS
  Filled 2022-04-09: qty 0.7

## 2022-04-09 MED ORDER — SODIUM CHLORIDE 0.9 % IV SOLN: Freq: Once | INTRAVENOUS | Status: AC

## 2022-04-09 MED ORDER — SODIUM CHLORIDE 0.9 % IV SOLN
16.0000 mg/kg | Freq: Once | INTRAVENOUS | Status: AC
  Administered 2022-04-09: 900 mg via INTRAVENOUS
  Filled 2022-04-09: qty 40

## 2022-04-09 NOTE — Progress Notes (Signed)
Patients port flushed without difficulty.  Good blood return noted with no bruising or swelling noted at site.  Stable during acces and blood draw.  Patient to remain accessed for treatment.

## 2022-04-09 NOTE — Progress Notes (Signed)
Pt presents today for Dara IV and Velade per provider's order. Vital signs and other labs WNL for treatment. Pt's ANC and is 1.0 and hemoglobin 7.2. Message sent to Dr.K and he stated pt is okay to proceed with treatment today. Pt will return tomorrow for 1 unit of blood per Dr.K.  Dara IV and Velcade given today per MD orders. Tolerated infusion without adverse affects. Vital signs stable. No complaints at this time. Discharged from clinic ambulatory in stable condition. Alert and oriented x 3. F/U with [facility] as scheduled.

## 2022-04-09 NOTE — Patient Instructions (Signed)
[place]  Discharge Instructions: Thank you for choosing [facility] to provide your oncology and hematology care.  If you have a lab appointment with the [place] [facility], please come in thru the Main Entrance and check in at the main information desk.  Wear comfortable clothing and clothing appropriate for easy access to any Portacath or PICC line.   We strive to give you quality time with your provider. You may need to reschedule your appointment if you arrive late (15 or more minutes).  Arriving late affects you and other patients whose appointments are after yours.  Also, if you miss three or more appointments without notifying the office, you may be dismissed from the clinic at the provider's discretion.      For prescription refill requests, have your pharmacy contact our office and allow 72 hours for refills to be completed.    Today you received the following chemotherapy and/or immunotherapy agents Dara IV and Velcade   To help prevent nausea and vomiting after your treatment, we encourage you to take your nausea medication as directed.  Daratumumab Injection What is this medication? DARATUMUMAB (dar a toom ue mab) treats multiple myeloma, a type of bone marrow cancer. It works by helping your immune system slow or stop the spread of cancer cells. It is a monoclonal antibody. This medicine may be used for other purposes; ask your health care provider or pharmacist if you have questions. COMMON BRAND NAME(S): DARZALEX What should I tell my care team before I take this medication? They need to know if you have any of these conditions: Hereditary fructose intolerance Infection, such as chickenpox, herpes, hepatitis B virus Lung or breathing disease, such as asthma, COPD An unusual or allergic reaction to daratumumab, sorbitol, other medications, foods, dyes, or preservatives Pregnant or trying to get pregnant Breast-feeding How should I use this  medication? This medication is injected into a vein. It is given by your care team in a hospital or clinic setting. Talk to your care team about the use of this medication in children. Special care may be needed. Overdosage: If you think you have taken too much of this medicine contact a poison control center or emergency room at once. NOTE: This medicine is only for you. Do not share this medicine with others. What if I miss a dose? Keep appointments for follow-up doses. It is important not to miss your dose. Call your care team if you are unable to keep an appointment. What may interact with this medication? Interactions have not been studied. This list may not describe all possible interactions. Give your health care provider a list of all the medicines, herbs, non-prescription drugs, or dietary supplements you use. Also tell them if you smoke, drink alcohol, or use illegal drugs. Some items may interact with your medicine. What should I watch for while using this medication? Your condition will be monitored carefully while you are receiving this medication. This medication can cause serious allergic reactions. To reduce your risk, your care team may give you other medication to take before receiving this one. Be sure to follow the directions from your care team. This medication can affect the results of blood tests to match your blood type. These changes can last for up to 6 months after the final dose. Your care team will do blood tests to match your blood type before you start treatment. Tell all of your care team that you are being treated with this medication  before receiving a blood transfusion. This medication can affect the results of some tests used to determine treatment response; extra tests may be needed to evaluate response. Talk to your care team if you wish to become pregnant or think you are pregnant. This medication can cause serious birth defects if taken during pregnancy and for  3 months after the last dose. A reliable form of contraception is recommended while taking this medication and for 3 months after the last dose. Talk to your care team about effective forms of contraception. Do not breast-feed while taking this medication. What side effects may I notice from receiving this medication? Side effects that you should report to your care team as soon as possible: Allergic reactions--skin rash, itching, hives, swelling of the face, lips, tongue, or throat Infection--fever, chills, cough, sore throat, wounds that don't heal, pain or trouble when passing urine, general feeling of discomfort or being unwell Infusion reactions--chest pain, shortness of breath or trouble breathing, feeling faint or lightheaded Unusual bruising or bleeding Side effects that usually do not require medical attention (report to your care team if they continue or are bothersome): Constipation Diarrhea Fatigue Nausea Pain, tingling, or numbness in the hands or feet Swelling of the ankles, hands, or feet This list may not describe all possible side effects. Call your doctor for medical advice about side effects. You may report side effects to FDA at 1-800-FDA-1088. Where should I keep my medication? This medication is given in a hospital or clinic. It will not be stored at home. NOTE: This sheet is a summary. It may not cover all possible information. If you have questions about this medicine, talk to your doctor, pharmacist, or health care provider.  2023 Elsevier/Gold Standard (2021-08-15 00:00:00)  Bortezomib Injection What is this medication? BORTEZOMIB (bor TEZ oh mib) treats lymphoma. It may also be used to treat multiple myeloma, a type of bone marrow cancer. It works by blocking a protein that causes cancer cells to grow and multiply. This helps to slow or stop the spread of cancer cells. This medicine may be used for other purposes; ask your health care provider or pharmacist if you  have questions. COMMON BRAND NAME(S): Velcade What should I tell my care team before I take this medication? They need to know if you have any of these conditions: Dehydration Diabetes Heart disease Liver disease Tingling of the fingers or toes or other nerve disorder An unusual or allergic reaction to bortezomib, other medications, foods, dyes, or preservatives If you or your partner are pregnant or trying to get pregnant Breastfeeding How should I use this medication? This medication is injected into a vein or under the skin. It is given by your care team in a hospital or clinic setting. Talk to your care team about the use of this medication in children. Special care may be needed. Overdosage: If you think you have taken too much of this medicine contact a poison control center or emergency room at once. NOTE: This medicine is only for you. Do not share this medicine with others. What if I miss a dose? Keep appointments for follow-up doses. It is important not to miss your dose. Call your care team if you are unable to keep an appointment. What may interact with this medication? Ketoconazole Rifampin This list may not describe all possible interactions. Give your health care provider a list of all the medicines, herbs, non-prescription drugs, or dietary supplements you use. Also tell them if you smoke, drink alcohol,  or use illegal drugs. Some items may interact with your medicine. What should I watch for while using this medication? Your condition will be monitored carefully while you are receiving this medication. You may need blood work while taking this medication. This medication may affect your coordination, reaction time, or judgment. Do not drive or operate machinery until you know how this medication affects you. Sit up or stand slowly to reduce the risk of dizzy or fainting spells. Drinking alcohol with this medication can increase the risk of these side effects. This  medication may increase your risk of getting an infection. Call your care team for advice if you get a fever, chills, sore throat, or other symptoms of a cold or flu. Do not treat yourself. Try to avoid being around people who are sick. Check with your care team if you have severe diarrhea, nausea, and vomiting, or if you sweat a lot. The loss of too much body fluid may make it dangerous for you to take this medication. Talk to your care team if you may be pregnant. Serious birth defects can occur if you take this medication during pregnancy and for 7 months after the last dose. You will need a negative pregnancy test before starting this medication. Contraception is recommended while taking this medication and for 7 months after the last dose. Your care team can help you find the option that works for you. If your partner can get pregnant, use a condom during sex while taking this medication and for 4 months after the last dose. Do not breastfeed while taking this medication and for 2 months after the last dose. This medication may cause infertility. Talk to your care team if you are concerned about your fertility. What side effects may I notice from receiving this medication? Side effects that you should report to your care team as soon as possible: Allergic reactions--skin rash, itching, hives, swelling of the face, lips, tongue, or throat Bleeding--bloody or black, tar-like stools, vomiting blood or brown material that looks like coffee grounds, red or dark brown urine, small red or purple spots on skin, unusual bruising or bleeding Bleeding in the brain--severe headache, stiff neck, confusion, dizziness, change in vision, numbness or weakness of the face, arm, or leg, trouble speaking, trouble walking, vomiting Bowel blockage--stomach cramping, unable to have a bowel movement or pass gas, loss of appetite, vomiting Heart failure--shortness of breath, swelling of the ankles, feet, or hands, sudden  weight gain, unusual weakness or fatigue Infection--fever, chills, cough, sore throat, wounds that don't heal, pain or trouble when passing urine, general feeling of discomfort or being unwell Liver injury--right upper belly pain, loss of appetite, nausea, light-colored stool, dark yellow or brown urine, yellowing skin or eyes, unusual weakness or fatigue Low blood pressure--dizziness, feeling faint or lightheaded, blurry vision Lung injury--shortness of breath or trouble breathing, cough, spitting up blood, chest pain, fever Pain, tingling, or numbness in the hands or feet Severe or prolonged diarrhea Stomach pain, bloody diarrhea, pale skin, unusual weakness or fatigue, decrease in the amount of urine, which may be signs of hemolytic uremic syndrome Sudden and severe headache, confusion, change in vision, seizures, which may be signs of posterior reversible encephalopathy syndrome (PRES) TTP--purple spots on the skin or inside the mouth, pale skin, yellowing skin or eyes, unusual weakness or fatigue, fever, fast or irregular heartbeat, confusion, change in vision, trouble speaking, trouble walking Tumor lysis syndrome (TLS)--nausea, vomiting, diarrhea, decrease in the amount of urine, dark urine, unusual weakness  or fatigue, confusion, muscle pain or cramps, fast or irregular heartbeat, joint pain Side effects that usually do not require medical attention (report to your care team if they continue or are bothersome): Constipation Diarrhea Fatigue Loss of appetite Nausea This list may not describe all possible side effects. Call your doctor for medical advice about side effects. You may report side effects to FDA at 1-800-FDA-1088. Where should I keep my medication? This medication is given in a hospital or clinic. It will not be stored at home. NOTE: This sheet is a summary. It may not cover all possible information. If you have questions about this medicine, talk to your doctor, pharmacist, or  health care provider.  2023 Elsevier/Gold Standard (2021-09-19 00:00:00)    BELOW ARE SYMPTOMS THAT SHOULD BE REPORTED IMMEDIATELY: *FEVER GREATER THAN 100.4 F (38 C) OR HIGHER *CHILLS OR SWEATING *NAUSEA AND VOMITING THAT IS NOT CONTROLLED WITH YOUR NAUSEA MEDICATION *UNUSUAL SHORTNESS OF BREATH *UNUSUAL BRUISING OR BLEEDING *URINARY PROBLEMS (pain or burning when urinating, or frequent urination) *BOWEL PROBLEMS (unusual diarrhea, constipation, pain near the anus) TENDERNESS IN MOUTH AND THROAT WITH OR WITHOUT PRESENCE OF ULCERS (sore throat, sores in mouth, or a toothache) UNUSUAL RASH, SWELLING OR PAIN  UNUSUAL VAGINAL DISCHARGE OR ITCHING   Items with * indicate a potential emergency and should be followed up as soon as possible or go to the Emergency Department if any problems should occur.  Please show the CHEMOTHERAPY ALERT CARD or IMMUNOTHERAPY ALERT CARD at check-in to the Emergency Department and triage nurse.  Should you have questions after your visit or need to cancel or reschedule your appointment, please contact [facility] [phone]  and follow the prompts.  Office hours are 8:00 a.m. to 4:30 p.m. Monday - Friday. Please note that voicemails left after 4:00 p.m. may not be returned until the following business day.  We are closed weekends and major holidays. You have access to a nurse at all times for urgent questions. Please call the main number to the clinic [phone] and follow the prompts.  For any non-urgent questions, you may also contact your provider using MyChart. We now offer e-Visits for anyone 67 and older to request care online for non-urgent symptoms. For details visit [email or web address].   Also download the MyChart app! Go to the app store, search "MyChart", open the app, select [facility], and log in with your MyChart username and password.  Masks are optional in the cancer centers. If you would like for your care team to  wear a mask while they are taking care of you, please let them know. You may have one support person who is at least 86 years old accompany you for your appointments.

## 2022-04-10 ENCOUNTER — Inpatient Hospital Stay: Payer: Medicare Other

## 2022-04-10 ENCOUNTER — Other Ambulatory Visit: Payer: Self-pay | Admitting: Cardiovascular Disease

## 2022-04-10 VITALS — BP 152/55 | HR 65 | Temp 97.3°F | Resp 20

## 2022-04-10 DIAGNOSIS — C9000 Multiple myeloma not having achieved remission: Secondary | ICD-10-CM

## 2022-04-10 DIAGNOSIS — D630 Anemia in neoplastic disease: Secondary | ICD-10-CM | POA: Diagnosis not present

## 2022-04-10 DIAGNOSIS — Z5112 Encounter for antineoplastic immunotherapy: Secondary | ICD-10-CM | POA: Diagnosis not present

## 2022-04-10 DIAGNOSIS — Z79899 Other long term (current) drug therapy: Secondary | ICD-10-CM | POA: Diagnosis not present

## 2022-04-10 MED ORDER — HEPARIN SOD (PORK) LOCK FLUSH 100 UNIT/ML IV SOLN
500.0000 [IU] | Freq: Once | INTRAVENOUS | Status: AC
  Administered 2022-04-10: 500 [IU] via INTRAVENOUS

## 2022-04-10 MED ORDER — SODIUM CHLORIDE 0.9% FLUSH
10.0000 mL | INTRAVENOUS | Status: DC | PRN
  Administered 2022-04-10: 10 mL via INTRAVENOUS

## 2022-04-11 ENCOUNTER — Other Ambulatory Visit: Payer: Self-pay

## 2022-04-11 ENCOUNTER — Ambulatory Visit: Payer: Medicare Other

## 2022-04-11 ENCOUNTER — Other Ambulatory Visit: Payer: Medicare Other

## 2022-04-11 DIAGNOSIS — Z515 Encounter for palliative care: Secondary | ICD-10-CM

## 2022-04-11 LAB — TYPE AND SCREEN
ABO/RH(D): O POS
Antibody Screen: POSITIVE
Donor AG Type: NEGATIVE
Donor AG Type: NEGATIVE
Unit division: 0
Unit division: 0

## 2022-04-11 NOTE — Progress Notes (Signed)
Ok to give least incompatible unit of blood to patient tomorrow per Dr. Delton Coombes

## 2022-04-12 ENCOUNTER — Inpatient Hospital Stay: Payer: Medicare Other

## 2022-04-12 DIAGNOSIS — Z5112 Encounter for antineoplastic immunotherapy: Secondary | ICD-10-CM | POA: Diagnosis not present

## 2022-04-12 DIAGNOSIS — Z79899 Other long term (current) drug therapy: Secondary | ICD-10-CM | POA: Diagnosis not present

## 2022-04-12 DIAGNOSIS — C9000 Multiple myeloma not having achieved remission: Secondary | ICD-10-CM

## 2022-04-12 DIAGNOSIS — D630 Anemia in neoplastic disease: Secondary | ICD-10-CM | POA: Diagnosis not present

## 2022-04-12 MED ORDER — SODIUM CHLORIDE 0.9 % IV SOLN: Freq: Once | INTRAVENOUS | Status: AC

## 2022-04-12 MED ORDER — HEPARIN SOD (PORK) LOCK FLUSH 100 UNIT/ML IV SOLN
500.0000 [IU] | Freq: Once | INTRAVENOUS | Status: AC
  Administered 2022-04-12: 500 [IU] via INTRAVENOUS

## 2022-04-12 MED ORDER — DIPHENHYDRAMINE HCL 25 MG PO CAPS
25.0000 mg | ORAL_CAPSULE | Freq: Once | ORAL | Status: AC
  Administered 2022-04-12: 25 mg via ORAL
  Filled 2022-04-12: qty 1

## 2022-04-12 MED ORDER — SODIUM CHLORIDE 0.9% FLUSH
10.0000 mL | Freq: Once | INTRAVENOUS | Status: AC
  Administered 2022-04-12: 10 mL via INTRAVENOUS

## 2022-04-12 MED ORDER — ACETAMINOPHEN 325 MG PO TABS
650.0000 mg | ORAL_TABLET | Freq: Once | ORAL | Status: AC
  Administered 2022-04-12: 650 mg via ORAL
  Filled 2022-04-12: qty 2

## 2022-04-12 NOTE — Patient Instructions (Signed)
[facility] [place]  Discharge Instructions: Thank you for choosing [facility] to provide your oncology and hematology care.  If you have a lab appointment with the Cancer Center, please come in thru the Main Entrance and check in at the main information desk.  Wear comfortable clothing and clothing appropriate for easy access to any Portacath or PICC line.   We strive to give you quality time with your provider. You may need to reschedule your appointment if you arrive late (15 or more minutes).  Arriving late affects you and other patients whose appointments are after yours.  Also, if you miss three or more appointments without notifying the office, you may be dismissed from the clinic at the provider's discretion.      For prescription refill requests, have your pharmacy contact our office and allow 72 hours for refills to be completed.    Today you received the following chemotherapy and/or immunotherapy agents blood transfusion      To help prevent nausea and vomiting after your treatment, we encourage you to take your nausea medication as directed.  BELOW ARE SYMPTOMS THAT SHOULD BE REPORTED IMMEDIATELY: *FEVER GREATER THAN 100.4 F (38 C) OR HIGHER *CHILLS OR SWEATING *NAUSEA AND VOMITING THAT IS NOT CONTROLLED WITH YOUR NAUSEA MEDICATION *UNUSUAL SHORTNESS OF BREATH *UNUSUAL BRUISING OR BLEEDING *URINARY PROBLEMS (pain or burning when urinating, or frequent urination) *BOWEL PROBLEMS (unusual diarrhea, constipation, pain near the anus) TENDERNESS IN MOUTH AND THROAT WITH OR WITHOUT PRESENCE OF ULCERS (sore throat, sores in mouth, or a toothache) UNUSUAL RASH, SWELLING OR PAIN  UNUSUAL VAGINAL DISCHARGE OR ITCHING   Items with * indicate a potential emergency and should be followed up as soon as possible or go to the Emergency Department if any problems should occur.  Please show the CHEMOTHERAPY ALERT CARD or IMMUNOTHERAPY ALERT CARD at check-in to the  Emergency Department and triage nurse.  Should you have questions after your visit or need to cancel or reschedule your appointment, please contact [facility] AT  [phone]  and follow the prompts.  Office hours are 8:00 a.m. to 4:30 p.m. Monday - Friday. Please note that voicemails left after 4:00 p.m. may not be returned until the following business day.  We are closed weekends and major holidays. You have access to a nurse at all times for urgent questions. Please call the main number to the clinic [phone] and follow the prompts.  For any non-urgent questions, you may also contact your provider using MyChart. We now offer e-Visits for anyone 18 and older to request care online for non-urgent symptoms. For details visit [email or web address].   Also download the MyChart app! Go to the app store, search "MyChart", open the app, select [facility], and log in with your MyChart username and password.  Masks are optional in the cancer centers. If you would like for your care team to wear a mask while they are taking care of you, please let them know. You may have one support person who is at least 86 years old accompany you for your appointments.  

## 2022-04-12 NOTE — Progress Notes (Signed)
Patient presents today for 1UPRBC per providers order.  Vital signs WNL.  Patient has no new complaints at this time.    Stable during transfusion without adverse affects.  Vital signs stable.  No complaints at this time.  Discharge from clinic ambulatory in stable condition.  Alert and oriented X 3.  Follow up with [facility] as scheduled.  

## 2022-04-13 ENCOUNTER — Other Ambulatory Visit (HOSPITAL_COMMUNITY): Payer: Self-pay | Admitting: Hematology

## 2022-04-13 DIAGNOSIS — C9000 Multiple myeloma not having achieved remission: Secondary | ICD-10-CM

## 2022-04-14 LAB — BPAM RBC
Blood Product Expiration Date: 202401042359
Blood Product Expiration Date: 202401042359
ISSUE DATE / TIME: 202312081015
Unit Type and Rh: 5100
Unit Type and Rh: 5100

## 2022-04-15 ENCOUNTER — Inpatient Hospital Stay: Payer: Medicare Other

## 2022-04-15 ENCOUNTER — Inpatient Hospital Stay (HOSPITAL_BASED_OUTPATIENT_CLINIC_OR_DEPARTMENT_OTHER): Payer: Medicare Other | Admitting: Physician Assistant

## 2022-04-15 VITALS — BP 169/61 | HR 70 | Temp 96.2°F | Resp 18 | Wt 127.8 lb

## 2022-04-15 VITALS — BP 116/56 | HR 72 | Temp 97.9°F

## 2022-04-15 DIAGNOSIS — C9000 Multiple myeloma not having achieved remission: Secondary | ICD-10-CM

## 2022-04-15 DIAGNOSIS — Z95828 Presence of other vascular implants and grafts: Secondary | ICD-10-CM

## 2022-04-15 DIAGNOSIS — N61 Mastitis without abscess: Secondary | ICD-10-CM

## 2022-04-15 DIAGNOSIS — Z79899 Other long term (current) drug therapy: Secondary | ICD-10-CM | POA: Diagnosis not present

## 2022-04-15 DIAGNOSIS — D630 Anemia in neoplastic disease: Secondary | ICD-10-CM | POA: Diagnosis not present

## 2022-04-15 DIAGNOSIS — Z5112 Encounter for antineoplastic immunotherapy: Secondary | ICD-10-CM | POA: Diagnosis not present

## 2022-04-15 LAB — CBC WITH DIFFERENTIAL/PLATELET
Abs Immature Granulocytes: 0.02 10*3/uL (ref 0.00–0.07)
Basophils Absolute: 0 10*3/uL (ref 0.0–0.1)
Basophils Relative: 0 %
Eosinophils Absolute: 0.4 10*3/uL (ref 0.0–0.5)
Eosinophils Relative: 11 %
HCT: 27.2 % — ABNORMAL LOW (ref 39.0–52.0)
Hemoglobin: 8.9 g/dL — ABNORMAL LOW (ref 13.0–17.0)
Immature Granulocytes: 1 %
Lymphocytes Relative: 17 %
Lymphs Abs: 0.7 10*3/uL (ref 0.7–4.0)
MCH: 33.2 pg (ref 26.0–34.0)
MCHC: 32.7 g/dL (ref 30.0–36.0)
MCV: 101.5 fL — ABNORMAL HIGH (ref 80.0–100.0)
Monocytes Absolute: 0.3 10*3/uL (ref 0.1–1.0)
Monocytes Relative: 9 %
Neutro Abs: 2.3 10*3/uL (ref 1.7–7.7)
Neutrophils Relative %: 62 %
Platelets: 89 10*3/uL — ABNORMAL LOW (ref 150–400)
RBC: 2.68 MIL/uL — ABNORMAL LOW (ref 4.22–5.81)
RDW: 26.5 % — ABNORMAL HIGH (ref 11.5–15.5)
WBC: 3.7 10*3/uL — ABNORMAL LOW (ref 4.0–10.5)
nRBC: 0 % (ref 0.0–0.2)

## 2022-04-15 LAB — COMPREHENSIVE METABOLIC PANEL
ALT: 24 U/L (ref 0–44)
AST: 18 U/L (ref 15–41)
Albumin: 2.8 g/dL — ABNORMAL LOW (ref 3.5–5.0)
Alkaline Phosphatase: 59 U/L (ref 38–126)
Anion gap: 3 — ABNORMAL LOW (ref 5–15)
BUN: 17 mg/dL (ref 8–23)
CO2: 24 mmol/L (ref 22–32)
Calcium: 8.6 mg/dL — ABNORMAL LOW (ref 8.9–10.3)
Chloride: 106 mmol/L (ref 98–111)
Creatinine, Ser: 0.77 mg/dL (ref 0.61–1.24)
GFR, Estimated: >60 mL/min (ref >60)
Glucose, Bld: 114 mg/dL — ABNORMAL HIGH (ref 70–99)
Potassium: 3.7 mmol/L (ref 3.5–5.1)
Sodium: 133 mmol/L — ABNORMAL LOW (ref 135–145)
Total Bilirubin: 0.5 mg/dL (ref 0.3–1.2)
Total Protein: 8.7 g/dL — ABNORMAL HIGH (ref 6.5–8.1)

## 2022-04-15 LAB — SAMPLE TO BLOOD BANK

## 2022-04-15 LAB — MAGNESIUM: Magnesium: 2.1 mg/dL (ref 1.7–2.4)

## 2022-04-15 MED ORDER — BORTEZOMIB CHEMO SQ INJECTION 3.5 MG (2.5MG/ML)
1.0000 mg/m2 | Freq: Once | INTRAMUSCULAR | Status: AC
  Administered 2022-04-15: 1.75 mg via SUBCUTANEOUS
  Filled 2022-04-15: qty 0.7

## 2022-04-15 MED ORDER — SODIUM CHLORIDE 0.9 % IV SOLN: Freq: Once | INTRAVENOUS | Status: AC

## 2022-04-15 MED ORDER — ACETAMINOPHEN 325 MG PO TABS
650.0000 mg | ORAL_TABLET | Freq: Once | ORAL | Status: AC
  Administered 2022-04-15: 650 mg via ORAL
  Filled 2022-04-15: qty 2

## 2022-04-15 MED ORDER — CEPHALEXIN 500 MG PO CAPS: 500.0000 mg | ORAL_CAPSULE | Freq: Four times a day (QID) | ORAL | 0 refills | Status: AC

## 2022-04-15 MED ORDER — SODIUM CHLORIDE 0.9 % IV SOLN
16.0000 mg/kg | Freq: Once | INTRAVENOUS | Status: AC
  Administered 2022-04-15: 900 mg via INTRAVENOUS
  Filled 2022-04-15: qty 5

## 2022-04-15 MED ORDER — SODIUM CHLORIDE 0.9% FLUSH
10.0000 mL | INTRAVENOUS | Status: DC | PRN
  Administered 2022-04-15: 10 mL

## 2022-04-15 MED ORDER — DIPHENHYDRAMINE HCL 25 MG PO CAPS
50.0000 mg | ORAL_CAPSULE | Freq: Once | ORAL | Status: AC
  Administered 2022-04-15: 50 mg via ORAL
  Filled 2022-04-15: qty 2

## 2022-04-15 MED ORDER — SODIUM CHLORIDE 0.9% FLUSH
10.0000 mL | Freq: Once | INTRAVENOUS | Status: AC
  Administered 2022-04-15: 10 mL via INTRAVENOUS

## 2022-04-15 MED ORDER — METHYLPREDNISOLONE SODIUM SUCC 125 MG IJ SOLR
100.0000 mg | Freq: Once | INTRAMUSCULAR | Status: AC
  Administered 2022-04-15: 100 mg via INTRAVENOUS
  Filled 2022-04-15: qty 2

## 2022-04-15 MED ORDER — ONDANSETRON HCL 4 MG/2ML IJ SOLN
8.0000 mg | Freq: Once | INTRAMUSCULAR | Status: AC
  Administered 2022-04-15: 8 mg via INTRAVENOUS
  Filled 2022-04-15: qty 4

## 2022-04-15 MED ORDER — HEPARIN SOD (PORK) LOCK FLUSH 100 UNIT/ML IV SOLN
500.0000 [IU] | Freq: Once | INTRAVENOUS | Status: AC | PRN
  Administered 2022-04-15: 500 [IU]

## 2022-04-15 NOTE — Patient Instructions (Signed)
[facility]  Discharge Instructions: Thank you for choosing [facility] to provide your oncology and hematology care.  If you have a lab appointment with the [facility], please come in thru the Main Entrance and check in at the main information desk.  Wear comfortable clothing and clothing appropriate for easy access to any Portacath or PICC line.   We strive to give you quality time with your provider. You may need to reschedule your appointment if you arrive late (15 or more minutes).  Arriving late affects you and other patients whose appointments are after yours.  Also, if you miss three or more appointments without notifying the office, you may be dismissed from the clinic at the provider's discretion.      For prescription refill requests, have your pharmacy contact our office and allow 72 hours for refills to be completed.    Today you received the following chemotherapy, velcade and Darzalex   To help prevent nausea and vomiting after your treatment, we encourage you to take your nausea medication as directed.  BELOW ARE SYMPTOMS THAT SHOULD BE REPORTED IMMEDIATELY: *FEVER GREATER THAN 100.4 F (38 C) OR HIGHER *CHILLS OR SWEATING *NAUSEA AND VOMITING THAT IS NOT CONTROLLED WITH YOUR NAUSEA MEDICATION *UNUSUAL SHORTNESS OF BREATH *UNUSUAL BRUISING OR BLEEDING *URINARY PROBLEMS (pain or burning when urinating, or frequent urination) *BOWEL PROBLEMS (unusual diarrhea, constipation, pain near the anus) TENDERNESS IN MOUTH AND THROAT WITH OR WITHOUT PRESENCE OF ULCERS (sore throat, sores in mouth, or a toothache) UNUSUAL RASH, SWELLING OR PAIN  UNUSUAL VAGINAL DISCHARGE OR ITCHING   Items with * indicate a potential emergency and should be followed up as soon as possible or go to the Emergency Department if any problems should occur.  Please show the CHEMOTHERAPY ALERT CARD or IMMUNOTHERAPY ALERT CARD at check-in to the Emergency Department and  triage nurse.  Should you have questions after your visit or need to cancel or reschedule your appointment, please contact [facility] [phone]  and follow the prompts.  Office hours are 8:00 a.m. to 4:30 p.m. Monday - Friday. Please note that voicemails left after 4:00 p.m. may not be returned until the following business day.  We are closed weekends and major holidays. You have access to a nurse at all times for urgent questions. Please call the main number to the clinic [phone] and follow the prompts.  For any non-urgent questions, you may also contact your provider using MyChart. We now offer e-Visits for anyone 28 and older to request care online for non-urgent symptoms. For details visit [email or web address].   Also download the MyChart app! Go to the app store, search "MyChart", open the app, select [facility], and log in with your MyChart username and password.  Masks are optional in the cancer centers. If you would like for your care team to wear a mask while they are taking care of you, please let them know. You may have one support person who is at least 86 years old accompany you for your appointments.

## 2022-04-15 NOTE — Patient Instructions (Addendum)
[facility] **   You were seen today by Tarri Abernethy PA-C for your cellulitis.    RIGHT NIPPLE CELLULITIS The redness and pain at your right nipple is suspected to be an infection called "cellulitis." You are more susceptible to infections due to your multiple myeloma and your cancer treatment. Prescription has been sent to your pharmacy for you to take cephalexin (Keflex) antibiotic 4 times daily for the next 10 days. You should notice improvement in your symptoms in 2 to 3 days. If your symptoms have not improved or have gotten worse, please call the cancer center. Seek IMMEDIATE medical attention if you develop any fever, chills, or other concerning symptoms.  FOLLOW-UP APPOINTMENT: Follow-up as scheduled with Dr. Delton Coombes on 04/23/2022.  ** Thank you for trusting me with your healthcare!  I strive to provide all of my patients with quality care at each visit.  If you receive a survey for this visit, I would be so grateful to you for taking the time to provide feedback.  Thank you in advance!  ~ Halo Shevlin                   Dr. Derek Jack   &   Tarri Abernethy, PA-C   - - - - - - - - - - - - - - - - - -    Thank you for choosing [facility] at [facility] to provide your oncology and hematology care.  To afford each patient quality time with our provider, please arrive at least 15 minutes before your scheduled appointment time.   If you have a lab appointment with the [facility] please come in thru the Main Entrance and check in at the main information desk.  You need to re-schedule your appointment should you arrive 10 or more minutes late.  We strive to give you quality time with our providers, and arriving late affects you and other patients whose appointments are after yours.  Also, if you no show three or more times for appointments you may be dismissed from the  clinic at the providers discretion.     Again, thank you for choosing [facility].  Our hope is that these requests will decrease the amount of time that you wait before being seen by our physicians.       _____________________________________________________________  Should you have questions after your visit to [facility], please contact our office at [phone] and follow the prompts.  Our office hours are 8:00 a.m. and 4:30 p.m. Monday - Friday.  Please note that voicemails left after 4:00 p.m. may not be returned until the following business day.  We are closed weekends and major holidays.  You do have access to a nurse 24-7, just call the main number to the clinic [phone] and do not press any options, hold on the line and a nurse will answer the phone.    For prescription refill requests, have your pharmacy contact our office and allow 72 hours.

## 2022-04-15 NOTE — Progress Notes (Addendum)
[name] [address] [place] [address] Phone: [phone] Fax: [facility] PROGRESS NOTE   Dale [name] [id number] 27-May-1928 88 y.o.  Dale [name] is managed by Dr. Delton Coombes for multiple myeloma.  Actively treated with chemotherapy/immunotherapy/hormonal therapy: YES  Current therapy: Weekly Velcade and Darzalex  Last treated: 04/09/2022  Next scheduled appointment with provider: 04/23/2022  Subjective:  Chief Complaint: Right nipple pain  Dale [name] is managed by Dr. Delton Coombes for multiple myeloma.  Patient receives weekly Velcade and Darzalex.  Prior to his treatment today, he made nurse aware of right nipple pain and I was asked to evaluate via symptom management visit.  Patient reports right-sided nipple pain for the past 4 days associated with tenderness.  No drainage.  No fever, chills, nausea, or vomiting.  No injury to the area.  Denies any itching or rash.  On exam, patient has some induration of his right areola just above his nipple with associated erythema and small pustules below the surface of the skin.  No fluctuance or obvious abscess.   Review of Systems:  Review of Systems  Constitutional:  Negative for chills and fever.  Gastrointestinal:  Negative for nausea and vomiting.  Skin:        Right nipple redness and pain.    Past Medical History, Surgical history, Social history, and Family history were reviewed as documented elsewhere in chart, and were updated as appropriate.   Assessment & Plan:    1.  Cellulitis of right breast - History and physical exam most consistent with cellulitis of the right nipple/breast - Differential diagnosis would also include atypical presentation of shingles or possible mass, although this is considered much less likely. - PLAN: Keflex x 10 days. - Patient instructed to seek immediate medical attention if he develops any fever, chills,  nausea, or vomiting. - Patient instructed to call clinic if he has not noticed improvement within the next 48 hours - Patient scheduled for MD visit next week, will have his cellulitis reassessed at that time.  2.  Multiple myeloma - Primary oncologist is Dr. Delton Coombes - Current treatment protocol with weekly Velcade and Darzalex - PLAN: Per discussion with on-call oncologist (Dr. Federico Flake), patient can proceed with treatment today. - Scheduled for MD visit in 1 week on 04/23/2022.   Objective:   Physical Exam:  There were no vitals taken for this visit. ECOG: 1  Physical Exam Vitals reviewed.  Constitutional:      Appearance: Normal appearance.  Cardiovascular:     Rate and Rhythm: Normal rate and regular rhythm.     Pulses: Normal pulses.     Heart sounds: Normal heart sounds.  Pulmonary:     Effort: Pulmonary effort is normal.     Breath sounds: Normal breath sounds.  Chest:  Breasts:    Right: Tenderness present.     Comments: Approximately 2 cm area of induration of his right areola just above nipple with associated erythema and small pustules below the surface of the skin.  No fluctuance or obvious abscess. Skin:    Findings: Lesion present.  Neurological:     General: No focal deficit present.     Mental Status: He is alert and oriented to person, place, and time.  Psychiatric:        Mood and Affect: Mood normal.        Behavior: Behavior normal.     Lab Review:     Component  Value Date/Time   NA 133 (L) 04/15/2022 0802   NA 139 10/26/2019 1013   K 3.7 04/15/2022 0802   CL 106 04/15/2022 0802   CO2 24 04/15/2022 0802   GLUCOSE 114 (H) 04/15/2022 0802   BUN 17 04/15/2022 0802   BUN 25 10/26/2019 1013   CREATININE 0.77 04/15/2022 0802   CALCIUM 8.6 (L) 04/15/2022 0802   PROT 8.7 (H) 04/15/2022 0802   PROT 7.3 10/26/2019 1013   ALBUMIN 2.8 (L) 04/15/2022 0802   ALBUMIN 4.0 10/26/2019 1013   AST 18 04/15/2022 0802   ALT 24 04/15/2022 0802   ALKPHOS  59 04/15/2022 0802   BILITOT 0.5 04/15/2022 0802   BILITOT 0.5 10/26/2019 1013   GFRNONAA >60 04/15/2022 0802   GFRAA >60 01/12/2020 1348       Component Value Date/Time   WBC 3.7 (L) 04/15/2022 0810   RBC 2.68 (L) 04/15/2022 0810   HGB 8.9 (L) 04/15/2022 0810   HGB 11.3 (L) 10/26/2019 1013   HCT 27.2 (L) 04/15/2022 0810   HCT 33.5 (L) 10/26/2019 1013   PLT 89 (L) 04/15/2022 0810   PLT 196 10/26/2019 1013   MCV 101.5 (H) 04/15/2022 0810   MCV 107 (H) 10/26/2019 1013   MCH 33.2 04/15/2022 0810   MCHC 32.7 04/15/2022 0810   RDW 26.5 (H) 04/15/2022 0810   RDW 12.7 10/26/2019 1013   LYMPHSABS 0.7 04/15/2022 0810   LYMPHSABS CANCELED 09/08/2015 0933   MONOABS 0.3 04/15/2022 0810   EOSABS 0.4 04/15/2022 0810   EOSABS CANCELED 09/08/2015 0933   BASOSABS 0.0 04/15/2022 0810   BASOSABS CANCELED 09/08/2015 0933   -------------------------------  Imaging from last 24 hours (if applicable):  Radiology interpretation: No results found.    Wrap-Up:    All questions were answered. The patient knows to call the clinic with any problems, questions or concerns.  Medical decision making: Low  Time spent on visit: I spent 10 minutes counseling the patient face to face. The total time spent in the appointment was 15 minutes and more than 50% was on counseling.   Harriett Rush, PA-C  04/15/22 11:42 PM

## 2022-04-15 NOTE — Progress Notes (Signed)
Labs reviewed with Dr. Federico Flake today. Platelets are 89, ok to treat per MD.   Patient stated he right "nipple was sore" as nurse was fixing to administer Saline infusion. Nurse assessed right breast, small area of erythema, firm nodule noted. Notified Tarri Abernethy PA-C to evaluate.   Ok to continue with treatment per PA and Dr. Federico Flake after assessment of right breast was done.   Treatment given per orders. Patient tolerated it well without problems. Vitals stable and discharged home from clinic ambulatory. Follow up as scheduled.

## 2022-04-16 ENCOUNTER — Encounter: Payer: Self-pay | Admitting: Hematology

## 2022-04-19 NOTE — Progress Notes (Signed)
PATIENT NAME: Dale [name] DOB: May 07, 1928 MRN: [id number]  PRIMARY CARE PROVIDER: No primary care provider on file.  RESPONSIBLE PARTY:  Acct ID - Guarantor Home Phone Work Phone Relationship Acct Type  [id number] ANGELLO, CHIEN* [phone]  Self P/F     [address] [place], [place], [place] [address]    I connected with  [name] on 04/19/22 by telephone and verified that I am speaking with the correct person using two identifiers.   I discussed the limitations of evaluation and management by telemedicine. The patient expressed understanding and agreed to proceed.    Spoke with both patient and wife.  Patient advises blood transfusion will occur tomorrow otherwise he is doing well.  Remaining inside more with the colder weather.  No recent falls reported.  Appetite remains good.  Meals provided through cancer center program has now stopped and patient has been referred to Meals on Wheels.  Patient believes this will begin in the next week.  He notes the importance of having this service due to limitations with cooking meals both he and his wife have.  No issues with pain reported.  Patient feels overall he is doing well and has no new concern to report today.  Palliative Care will follow up in 2 weeks.   Lorenza Burton, RN

## 2022-04-23 ENCOUNTER — Inpatient Hospital Stay: Payer: Medicare Other

## 2022-04-23 ENCOUNTER — Inpatient Hospital Stay (HOSPITAL_BASED_OUTPATIENT_CLINIC_OR_DEPARTMENT_OTHER): Payer: Medicare Other | Admitting: Hematology

## 2022-04-23 DIAGNOSIS — Z5112 Encounter for antineoplastic immunotherapy: Secondary | ICD-10-CM | POA: Diagnosis not present

## 2022-04-23 DIAGNOSIS — C9000 Multiple myeloma not having achieved remission: Secondary | ICD-10-CM

## 2022-04-23 DIAGNOSIS — D649 Anemia, unspecified: Secondary | ICD-10-CM

## 2022-04-23 DIAGNOSIS — D696 Thrombocytopenia, unspecified: Secondary | ICD-10-CM

## 2022-04-23 DIAGNOSIS — Z79899 Other long term (current) drug therapy: Secondary | ICD-10-CM | POA: Diagnosis not present

## 2022-04-23 DIAGNOSIS — D630 Anemia in neoplastic disease: Secondary | ICD-10-CM | POA: Diagnosis not present

## 2022-04-23 LAB — PREPARE RBC (CROSSMATCH)

## 2022-04-23 LAB — COMPREHENSIVE METABOLIC PANEL
ALT: 28 U/L (ref 0–44)
AST: 17 U/L (ref 15–41)
Albumin: 2.6 g/dL — ABNORMAL LOW (ref 3.5–5.0)
Alkaline Phosphatase: 51 U/L (ref 38–126)
Anion gap: 3 — ABNORMAL LOW (ref 5–15)
BUN: 26 mg/dL — ABNORMAL HIGH (ref 8–23)
CO2: 23 mmol/L (ref 22–32)
Calcium: 8.5 mg/dL — ABNORMAL LOW (ref 8.9–10.3)
Chloride: 108 mmol/L (ref 98–111)
Creatinine, Ser: 0.76 mg/dL (ref 0.61–1.24)
GFR, Estimated: >60 mL/min (ref >60)
Glucose, Bld: 106 mg/dL — ABNORMAL HIGH (ref 70–99)
Potassium: 3.6 mmol/L (ref 3.5–5.1)
Sodium: 134 mmol/L — ABNORMAL LOW (ref 135–145)
Total Bilirubin: 0.7 mg/dL (ref 0.3–1.2)
Total Protein: 7.8 g/dL (ref 6.5–8.1)

## 2022-04-23 LAB — CBC WITH DIFFERENTIAL/PLATELET
Abs Immature Granulocytes: 0.03 10*3/uL (ref 0.00–0.07)
Basophils Absolute: 0 10*3/uL (ref 0.0–0.1)
Basophils Relative: 0 %
Eosinophils Absolute: 0 10*3/uL (ref 0.0–0.5)
Eosinophils Relative: 1 %
HCT: 22.9 % — ABNORMAL LOW (ref 39.0–52.0)
Hemoglobin: 7.4 g/dL — ABNORMAL LOW (ref 13.0–17.0)
Immature Granulocytes: 1 %
Lymphocytes Relative: 21 %
Lymphs Abs: 1 10*3/uL (ref 0.7–4.0)
MCH: 34.1 pg — ABNORMAL HIGH (ref 26.0–34.0)
MCHC: 32.3 g/dL (ref 30.0–36.0)
MCV: 105.5 fL — ABNORMAL HIGH (ref 80.0–100.0)
Monocytes Absolute: 0.7 10*3/uL (ref 0.1–1.0)
Monocytes Relative: 13 %
Neutro Abs: 3.3 10*3/uL (ref 1.7–7.7)
Neutrophils Relative %: 64 %
Platelets: 36 10*3/uL — ABNORMAL LOW (ref 150–400)
RBC: 2.17 MIL/uL — ABNORMAL LOW (ref 4.22–5.81)
WBC: 5.1 10*3/uL (ref 4.0–10.5)
nRBC: 0 % (ref 0.0–0.2)

## 2022-04-23 LAB — SAMPLE TO BLOOD BANK

## 2022-04-23 LAB — MAGNESIUM: Magnesium: 2.1 mg/dL (ref 1.7–2.4)

## 2022-04-23 MED ORDER — SODIUM CHLORIDE 0.9% FLUSH
10.0000 mL | INTRAVENOUS | Status: DC | PRN
  Administered 2022-04-23: 10 mL via INTRAVENOUS

## 2022-04-23 MED ORDER — HEPARIN SOD (PORK) LOCK FLUSH 100 UNIT/ML IV SOLN
500.0000 [IU] | Freq: Once | INTRAVENOUS | Status: AC
  Administered 2022-04-23: 500 [IU] via INTRAVENOUS

## 2022-04-23 NOTE — Progress Notes (Signed)
Dale [name], [place] [address]   CLINIC:  Medical Oncology/Hematology  PCP:  No primary care provider on file. No primary physician on file. None   REASON FOR VISIT:  Follow-up for multiple myeloma and anemia  PRIOR THERAPY: none  CURRENT THERAPY: Weekly Velcade and dexamethasone  BRIEF ONCOLOGIC HISTORY:  Oncology History  Multiple myeloma ([place])  01/15/2021 Initial Diagnosis   Multiple myeloma ([place])   06/25/2021 - 12/06/2021 Chemotherapy   Patient is on Treatment Plan : MYELOMA NON-TRANSPLANT CANDIDATES VRd weekly q21d     12/20/2021 -  Chemotherapy   Patient is on Treatment Plan : MYELOMA Daratumumab IV q28d       CANCER STAGING:  Cancer Staging  No matching staging information was found for the patient.  INTERVAL HISTORY:  Dale [name], a 88 y.o. male, seen for follow-up of multiple myeloma and toxicity assessment prior to next treatment.  Reports energy levels of 60%.  Reports a lump in his right breast which he felt last week.  This lump has been tender last week and has improved this week.  REVIEW OF SYSTEMS:  Review of Systems  Gastrointestinal:  Positive for constipation and diarrhea.  All other systems reviewed and are negative.   PAST MEDICAL/SURGICAL HISTORY:  Past Medical History:  Diagnosis Date   Allergic rhinitis    Anal fissure    Anginal pain (HCC)    Asthma    Cancer (HCC)    skin   Chest pain    CHF (congestive heart failure) ([place])    Coronary heart disease    s/p stenting. cath in 01/2012 noncritical occlusion   Dyspnea    Dysrhythmia    1st degree heart block   GERD (gastroesophageal reflux disease)    Glaucoma    Hiatal hernia    Hyperlipidemia    Hypertension    Hypothyroidism    Idiopathic thrombocytopenic purpura ([facility]) 2002   Macular degeneration    Nephrolithiasis    PUD (peptic ulcer disease)    remote   Sarcoidosis    pulmonary   Schatzki's ring    Past Surgical History:   Procedure Laterality Date   cardiac stents     COLONOSCOPY  10/30/2006   Normal rectum, sigmoid diverticula.Remainder of colonic mucosa appeared normal.   CORONARY ANGIOPLASTY WITH STENT PLACEMENT     about 10 years ago per pt (around 2007)   [place], URETEROSCOPY AND STENT PLACEMENT Left 06/16/2017   Procedure: CYSTOSCOPY WITH RETROGRADE PYELOGRAM, URETEROSCOPY,STONE EXTRACTION  AND STENT PLACEMENT;  Surgeon: Franchot Gallo, MD;  Location: [facility];  Service: Urology;  Laterality: Left;   ESOPHAGOGASTRODUODENOSCOPY  06/19/2004   Two esophageal rings and esophageal web as described above.  All of these were disrupted by passing 56-French Venia Minks dilator/ Candida esophagitis,which appears to be incidental given history of   antibiotic use, but nevertheless will be treated.   ESOPHAGOGASTRODUODENOSCOPY  10/30/2006   Distal tandem esophageal ring status post dilation disruption as  described above.  Otherwise normal esophagus/  Small hiatal hernia otherwise normal stomach, D1 and D2   ESOPHAGOGASTRODUODENOSCOPY N/A 03/22/2015   Dr.Rourk- noncritical schatzki's ring and hiatal hernia-o/w normal EGD.    ESOPHAGOGASTRODUODENOSCOPY (EGD) WITH ESOPHAGEAL DILATION  03/04/2012   RMR- schatzki's ring, hiatal hernia, polypoid gastric mucosa, bx= minimally active gastritis.   HOLMIUM LASER APPLICATION Left 5/68/1275   Procedure: HOLMIUM LASER APPLICATION;  Surgeon: Franchot Gallo, MD;  Location: [facility];  Service: Urology;  Laterality: Left;   IR CV LINE INJECTION  12/27/2021   IR GENERIC HISTORICAL  03/06/2016   IR RADIOLOGIST EVAL & MGMT 03/06/2016 Aletta Edouard, MD [facility] INTERV RAD   IR GENERIC HISTORICAL  06/18/2016   IR RADIOLOGIST EVAL & MGMT 06/18/2016 Aletta Edouard, MD [facility] INTERV RAD   IR IMAGING GUIDED PORT INSERTION  12/18/2021   IR IMAGING GUIDED PORT INSERTION  01/15/2022   IR RADIOLOGIST EVAL & MGMT  10/01/2016   IR RADIOLOGIST EVAL & MGMT  10/15/2017   IR  RADIOLOGIST EVAL & MGMT  12/24/2018   IR RADIOLOGIST EVAL & MGMT  01/04/2020   IR RADIOLOGIST EVAL & MGMT  06/20/2021   IR REMOVAL TUN ACCESS W/ PORT W/O FL MOD SED  01/15/2022   LEFT HEART CATH N/A 02/02/2012   Procedure: LEFT HEART CATH;  Surgeon: Lorretta Harp, MD;  Location: [facility] CATH LAB;  Service: Cardiovascular;  Laterality: N/A;   MEDIASTINOSCOPY     for dx sarcoid   RADIOLOGY WITH ANESTHESIA Left 05/17/2016   Procedure: left renal ablation;  Surgeon: Aletta Edouard, MD;  Location: [facility];  Service: Radiology;  Laterality: Left;    SOCIAL HISTORY:  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    Employer: RETIRED  Tobacco Use   Smoking status: Former    Packs/day: 0.10    Years: 2.00    Total pack years: 0.20    Types: Cigarettes, Cigars    Quit date: 05/06/1970    Years since quitting: 52.0   Smokeless tobacco: Never  Vaping Use   Vaping Use: Never used  Substance and Sexual Activity   Alcohol use: No    Alcohol/week: 0.0 standard drinks of alcohol   Drug use: No   Sexual activity: Never  Other Topics Concern   Not on file  Social History Narrative   Not on file   Social Determinants of Health   Financial Resource Strain: Not on file  Food Insecurity: No Food Insecurity (02/06/2022)   Hunger Vital Sign    Worried About Running Out of Food in the Last Year: Never true    Ran Out of Food in the Last Year: Never true  Transportation Needs: No Transportation Needs (02/06/2022)   PRAPARE - Hydrologist (Medical): No    Lack of Transportation (Non-Medical): No  Physical Activity: Not on file  Stress: Not on file  Social Connections: Not on file  Intimate Partner Violence: Not At Risk (02/06/2022)   Humiliation,  Afraid, Rape, and Kick questionnaire    Fear of Current or Ex-Partner: No    Emotionally Abused: No    Physically Abused: No    Sexually Abused: No    FAMILY HISTORY:  Family History  Problem Relation Age of Onset   Heart disease Father        deceased age 73   Stroke Mother    Alzheimer's disease Mother    Heart attack Brother        deceased age 35   Cancer Other        niece   Colon cancer Neg Hx     CURRENT MEDICATIONS:  Current Outpatient Medications  Medication Sig Dispense  Refill   acetaminophen (TYLENOL) 325 MG tablet Take 2 tablets (650 mg total) by mouth every 6 (six) hours as needed for mild pain (or Fever >/= 101).     acyclovir (ZOVIRAX) 400 MG tablet TAKE 1 TABLET BY MOUTH TWICE  DAILY 180 tablet 0   Ascorbic Acid (VITAMIN C PO) Take 500 mg by mouth every evening.     atorvastatin (LIPITOR) 40 MG tablet TAKE 1 TABLET BY MOUTH IN  THE EVENING (Patient taking differently: Take 40 mg by mouth daily.) 90 tablet 3   Bortezomib (VELCADE IJ) Inject as directed once a week.     brimonidine (ALPHAGAN) 0.2 % ophthalmic solution Place 1 drop into the left eye 2 (two) times daily.     cephALEXin (KEFLEX) 500 MG capsule Take 1 capsule (500 mg total) by mouth 4 (four) times daily for 10 days. 40 capsule 0   cyanocobalamin 1000 MCG tablet Take 1 tablet (1,000 mcg total) by mouth daily. 30 tablet 3   dexamethasone (DECADRON) 2 MG tablet TAKE 5 TABLETS BY MOUTH ONCE  WEEKLY 60 tablet 0   dorzolamide-timolol (COSOPT) 22.3-6.8 MG/ML ophthalmic solution Place 1 drop into the left eye 2 (two) times daily.     feeding supplement (ENSURE ENLIVE / ENSURE PLUS) LIQD Take 237 mLs by mouth 3 (three) times daily between meals.     isosorbide mononitrate (IMDUR) 60 MG 24 hr tablet Take 1 tablet (60 mg total) by mouth daily. TAKE 1 AND 1/2 TABLETS BY  MOUTH IN THE MORNING AND  1/2 TABLET AT NIGHT     levothyroxine (SYNTHROID) 75 MCG tablet Take 75 mcg by mouth daily before breakfast.      lidocaine-prilocaine (EMLA) cream Apply 1 Application topically as needed. 30 g 1   losartan (COZAAR) 100 MG tablet TAKE 1 TABLET BY MOUTH DAILY 90 tablet 3   metoprolol tartrate (LOPRESSOR) 25 MG tablet TAKE 1 TABLET BY MOUTH IN  THE MORNING AND ONE-HALF  TABLET BY MOUTH IN THE  EVENING (Patient taking differently: Take 12.5-25 mg by mouth in the morning and at bedtime. TAKE 1 TABLET BY MOUTH IN  THE MORNING AND ONE-HALF  TABLET BY MOUTH IN THE  EVENING) 135 tablet 3   Multiple Vitamins-Minerals (PRESERVISION/LUTEIN) CAPS Take 1 capsule by mouth 2 (two) times daily.     nitroGLYCERIN (NITROSTAT) 0.4 MG SL tablet Place 1 tablet (0.4 mg total) under the tongue every 5 (five) minutes as needed. For chest pain 25 tablet 2   pantoprazole (PROTONIX) 40 MG tablet TAKE 1 TABLET BY MOUTH DAILY  BEFORE BREAKFAST 90 tablet 3   potassium chloride SA (KLOR-CON M) 20 MEQ tablet TAKE 1 TABLET BY MOUTH  DAILY (Patient taking differently: Take 20 mEq by mouth daily.) 90 tablet 3   prochlorperazine (COMPAZINE) 10 MG tablet Take 1 tablet (10 mg total) by mouth every 6 (six) hours as needed for nausea or vomiting. 30 tablet 3   ROCKLATAN 0.02-0.005 % SOLN Place 1 drop into the left eye at bedtime.     triamcinolone (KENALOG) 0.1 % cream Apply 1 application. topically 2 (two) times daily as needed (for irritation).     vitamin E 200 UNIT capsule Take 200 Units by mouth every evening.     No current facility-administered medications for this visit.   Facility-Administered Medications Ordered in Other Visits  Medication Dose Route Frequency Provider Last Rate Last Admin   acetaminophen (TYLENOL) 325 MG tablet  diphenhydrAMINE (BENADRYL) 25 mg capsule            montelukast (SINGULAIR) 10 MG tablet             ALLERGIES:  Allergies  Allergen Reactions   Azithromycin Other (See Comments)    Sore mouth and fever blisters around mouth, sores in nose area as well   Doxazosin Shortness Of Breath   Atenolol  Other (See Comments)    UNKNOWN REACTION   Hydrocodone Nausea And Vomiting   Levofloxacin Other (See Comments)    Caused stomach problems.   Morphine Other (See Comments)    "made me crazy"   Penicillins Nausea And Vomiting and Other (See Comments)    Has patient had a PCN reaction causing immediate rash, facial/tongue/throat swelling, SOB or lightheadedness with hypotension: No Has patient had a PCN reaction causing severe rash involving mucus membranes or skin necrosis: No Has patient had a PCN reaction that required hospitalization No Has patient had a PCN reaction occurring within the last 10 years: No If all of the above answers are "NO", then may proceed with Cephalosporin use.    Sulfonamide Derivatives Nausea And Vomiting    PHYSICAL EXAM:  Performance status (ECOG): 0 - Asymptomatic  There were no vitals filed for this visit.  Wt Readings from Last 3 Encounters:  04/23/22 127 lb 9.6 oz (57.9 kg)  04/15/22 127 lb 12.8 oz (58 kg)  04/09/22 127 lb 6.4 oz (57.8 kg)   Physical Exam Vitals reviewed.  Constitutional:      Appearance: Normal appearance.  Cardiovascular:     Rate and Rhythm: Normal rate and regular rhythm.     Pulses: Normal pulses.     Heart sounds: Normal heart sounds.  Pulmonary:     Effort: Pulmonary effort is normal.     Breath sounds: Normal breath sounds.  Neurological:     General: No focal deficit present.     Mental Status: He is alert and oriented to person, place, and time.  Psychiatric:        Mood and Affect: Mood normal.        Behavior: Behavior normal.     LABORATORY DATA:  I have reviewed the labs as listed.     Latest Ref Rng & Units 04/23/2022    8:56 AM 04/15/2022    8:10 AM 04/09/2022    8:07 AM  CBC  WBC 4.0 - 10.5 K/uL 5.1  3.7  2.9   Hemoglobin 13.0 - 17.0 g/dL 7.4  8.9  7.2   Hematocrit 39.0 - 52.0 % 22.9  27.2  22.7   Platelets 150 - 400 K/uL 36  89  107       Latest Ref Rng & Units 04/23/2022    8:56 AM  04/15/2022    8:02 AM 04/09/2022    8:07 AM  CMP  Glucose 70 - 99 mg/dL 106  114  106   BUN 8 - 23 mg/dL _0 Creatinine 0.61 - 1.24 mg/dL 0.76  0.77  0.84   Sodium 135 - 145 mmol/L 134  133  135   Potassium 3.5 - 5.1 mmol/L 3.6  3.7  3.5   Chloride 98 - 111 mmol/L 108  106  106   CO2 22 - 32 mmol/L _1 Calcium 8.9 - 10.3 mg/dL 8.5  8.6  8.7   Total Protein 6.5 - 8.1 g/dL 7.8  8.7  8.7  Total Bilirubin 0.3 - 1.2 mg/dL 0.7  0.5  0.3   Alkaline Phos 38 - 126 U/L 51  59  64   AST 15 - 41 U/L _0 ALT 0 - 44 U/L _1 DIAGNOSTIC IMAGING:  I have independently reviewed the scans and discussed with the patient. No results found.   ASSESSMENT:  1.  IgG lambda plasma cell myeloma: - Work-up for macrocytic anemia on 12/01/2020 showed M spike of 2.9 g.  Immunofixation IgG lambda. - Lambda light chains elevated at 284.  Light chain ratio was 0.04.  LDH was 163.  Creatinine was 0.9 and calcium 8.9. - Bone marrow biopsy on 12/18/2020-hypercellular marrow for age with 48% atypical plasma cells with lambda light chain restriction. - Chromosome analysis was normal. 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 - Revlimid 5 mg, 2 weeks on/1 week off started on 01/20/2021.  Dexamethasone weekly 10 mg added on 02/07/2021.  Revlimid is discontinued around 06/15/2021 due to poor tolerance and ineffectiveness at low-dose.  Thrombocytopenia and anemia. - Velcade weekly on days 1, 8, 15 every 21 days along with dexamethasone 10 mg weekly started on 06/25/2021.  2.  Macrocytic anemia: - CBC on 12/01/2020 with hemoglobin 8.5 and MCV of 117. - Denies any bleeding per rectum or melena.  3.  Social/family history: - Lives at home with his wife.  He does all ADLs and IADLs.  He even does yard work. - He worked at [employer] for 35 years.  Denies any chemical exposure.  Non-smoker. - No family history  of malignancies.   PLAN:  1.  IgG lambda plasma cell myeloma, high risk: - Myeloma panel from 03/14/2022 with M spike increased to 3.7 g from 3 g.  Lambda light chains of 506 and ratio of 0.01. - We switched Velcade and Darzalex to weekly regimen since then. - Reviewed labs today which showed normal LFTs and creatinine.  CBC shows platelet count is 36.  Hemoglobin 7.4. - We will hold Velcade and Darzalex this week.  Will give 1 unit of PRBC.  He will come back next week and will have treatment if platelet count improves.  Will continue to monitor CBC every week for transfusions to keep hemoglobin above 8.  RTC 2 weeks for follow-up.  Will follow-up on myeloma labs from today.   2.  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  Continue weekly labs and transfuse as needed to keep hemoglobin above 8.  3.  Thromboprophylaxis: - Continue aspirin 81 mg daily.  4.  Right ankle swelling: - Continue Lasix as needed.  5.  ID prophylaxis: - Continue acyclovir twice daily.  6.  Right breast lump: - Noticed during the week of 06/16/2021.  It was sore and has improved.  There is a 0.5 cm mass in the right breast 12:00 on exam which was mildly sore.  Will closely monitor.   Orders placed this encounter:  No orders of the defined types were placed in this encounter.      Derek Jack, MD [place] [phone]

## 2022-04-23 NOTE — Patient Instructions (Addendum)
[facility]  Discharge Instructions  You were seen and examined today by Dr. Delton Coombes.  He reviewed the results of your lab work. Your hemoglobin is 7.4. We will plan to give you a unit of blood tomorrow. Your platelet count is 36,000. Your kidney and liver function tests are pending.   We will hold your treatment today.   Return as scheduled.    Thank you for choosing [facility] to provide your oncology and hematology care.   To afford each patient quality time with our provider, please arrive at least 15 minutes before your scheduled appointment time. You may need to reschedule your appointment if you arrive late (10 or more minutes). Arriving late affects you and other patients whose appointments are after yours.  Also, if you miss three or more appointments without notifying the office, you may be dismissed from the clinic at the provider's discretion.    Again, thank you for choosing [facility].  Our hope is that these requests will decrease the amount of time that you wait before being seen by our physicians.   If you have a lab appointment with the [facility] please come in thru the Main Entrance and check in at the main information desk.           _____________________________________________________________  Should you have questions after your visit to [facility], please contact our office at [phone] and follow the prompts.  Our office hours are 8:00 a.m. to 4:30 p.m. Monday - Thursday and 8:00 a.m. to 2:30 p.m. Friday.  Please note that voicemails left after 4:00 p.m. may not be returned until the following business day.  We are closed weekends and all major holidays.  You do have access to a nurse 24-7, just call the main number to the clinic [phone] and do not press any options, hold on the line and a nurse will answer the phone.    For prescription refill requests, have your  pharmacy contact our office and allow 72 hours.    Masks are optional in the cancer centers. If you would like for your care team to wear a mask while they are taking care of you, please let them know. You may have one support person who is at least 86 years old accompany you for your appointments.

## 2022-04-23 NOTE — Patient Instructions (Signed)
[facility]  Discharge Instructions: Thank you for choosing [facility] to provide your oncology and hematology care.  If you have a lab appointment with the [facility], please come in thru the Main Entrance and check in at the main information desk.  Wear comfortable clothing and clothing appropriate for easy access to any Portacath or PICC line.   We strive to give you quality time with your provider. You may need to reschedule your appointment if you arrive late (15 or more minutes).  Arriving late affects you and other patients whose appointments are after yours.  Also, if you miss three or more appointments without notifying the office, you may be dismissed from the clinic at the provider's discretion.      For prescription refill requests, have your pharmacy contact our office and allow 72 hours for refills to be completed.   No treatment today.    To help prevent nausea and vomiting after your treatment, we encourage you to take your nausea medication as directed.  BELOW ARE SYMPTOMS THAT SHOULD BE REPORTED IMMEDIATELY: *FEVER GREATER THAN 100.4 F (38 C) OR HIGHER *CHILLS OR SWEATING *NAUSEA AND VOMITING THAT IS NOT CONTROLLED WITH YOUR NAUSEA MEDICATION *UNUSUAL SHORTNESS OF BREATH *UNUSUAL BRUISING OR BLEEDING *URINARY PROBLEMS (pain or burning when urinating, or frequent urination) *BOWEL PROBLEMS (unusual diarrhea, constipation, pain near the anus) TENDERNESS IN MOUTH AND THROAT WITH OR WITHOUT PRESENCE OF ULCERS (sore throat, sores in mouth, or a toothache) UNUSUAL RASH, SWELLING OR PAIN  UNUSUAL VAGINAL DISCHARGE OR ITCHING   Items with * indicate a potential emergency and should be followed up as soon as possible or go to the Emergency Department if any problems should occur.  Please show the CHEMOTHERAPY ALERT CARD or IMMUNOTHERAPY ALERT CARD at check-in to the Emergency Department and triage nurse.  Should you have questions after  your visit or need to cancel or reschedule your appointment, please contact [facility] [phone]  and follow the prompts.  Office hours are 8:00 a.m. to 4:30 p.m. Monday - Friday. Please note that voicemails left after 4:00 p.m. may not be returned until the following business day.  We are closed weekends and major holidays. You have access to a nurse at all times for urgent questions. Please call the main number to the clinic [phone] and follow the prompts.  For any non-urgent questions, you may also contact your provider using MyChart. We now offer e-Visits for anyone 33 and older to request care online for non-urgent symptoms. For details visit [email or web address].   Also download the MyChart app! Go to the app store, search "MyChart", open the app, select [facility], and log in with your MyChart username and password.  Masks are optional in the cancer centers. If you would like for your care team to wear a mask while they are taking care of you, please let them know. You may have one support person who is at least 86 years old accompany you for your appointments.

## 2022-04-23 NOTE — Progress Notes (Signed)
Chaplain engaged in a follow-up visit with Dale [name] .  He was in good spirits today.  He shared about how the blood shortage he experienced at [facility] made him feel "a little funny."  He questioned what would happen if he couldn't receive blood.  He wanted to know what his alternative would be if he couldn't get it.  He is thankful to soon be receiving a transfusion.  Chaplain worked to affirm the presence of what he needs and being able to get blood soon.   Chaplain assesses that [facility] utilizes humor and laughter for resilience.     04/23/22 1100  Clinical Encounter Type  Visited With Patient  Visit Type Follow-up;Spiritual support

## 2022-04-23 NOTE — Progress Notes (Signed)
Labs reviewed with MD today at office visit. Holding treatment today per MD. Will order one unit of PRBC to be transfused this week as scheduled per MD.   Additional labs drawn today per orders. Vitals stable and discharged home from clinic ambulatory. Follow up as scheduled.

## 2022-04-24 ENCOUNTER — Inpatient Hospital Stay: Payer: Medicare Other

## 2022-04-24 DIAGNOSIS — C9000 Multiple myeloma not having achieved remission: Secondary | ICD-10-CM

## 2022-04-24 DIAGNOSIS — D630 Anemia in neoplastic disease: Secondary | ICD-10-CM | POA: Diagnosis not present

## 2022-04-24 DIAGNOSIS — Z5112 Encounter for antineoplastic immunotherapy: Secondary | ICD-10-CM | POA: Diagnosis not present

## 2022-04-24 DIAGNOSIS — Z79899 Other long term (current) drug therapy: Secondary | ICD-10-CM | POA: Diagnosis not present

## 2022-04-24 LAB — KAPPA/LAMBDA LIGHT CHAINS
Kappa free light chain: 2.3 mg/L — ABNORMAL LOW (ref 3.3–19.4)
Kappa, lambda light chain ratio: 0 — ABNORMAL LOW (ref 0.26–1.65)
Lambda free light chains: 640.1 mg/L — ABNORMAL HIGH (ref 5.7–26.3)

## 2022-04-24 MED ORDER — HEPARIN SOD (PORK) LOCK FLUSH 100 UNIT/ML IV SOLN
500.0000 [IU] | Freq: Every day | INTRAVENOUS | Status: AC | PRN
  Administered 2022-04-24: 500 [IU]

## 2022-04-24 MED ORDER — DIPHENHYDRAMINE HCL 25 MG PO CAPS
25.0000 mg | ORAL_CAPSULE | Freq: Once | ORAL | Status: AC
  Administered 2022-04-24: 25 mg via ORAL
  Filled 2022-04-24: qty 1

## 2022-04-24 MED ORDER — ACETAMINOPHEN 325 MG PO TABS
650.0000 mg | ORAL_TABLET | Freq: Once | ORAL | Status: AC
  Administered 2022-04-24: 650 mg via ORAL
  Filled 2022-04-24: qty 2

## 2022-04-24 MED ORDER — SODIUM CHLORIDE 0.9% IV SOLUTION
250.0000 mL | Freq: Once | INTRAVENOUS | Status: AC
  Administered 2022-04-24: 250 mL via INTRAVENOUS

## 2022-04-24 MED ORDER — SODIUM CHLORIDE 0.9% FLUSH
10.0000 mL | INTRAVENOUS | Status: AC | PRN
  Administered 2022-04-24: 10 mL

## 2022-04-24 NOTE — Progress Notes (Signed)
Patient presents today for 1 unit of PRBCS. Patient tolerated therapy with no complaints voiced. Side effects with management reviewed with understanding verbalized. Port site clean and dry with no bruising or swelling noted at site. Good blood return noted before and after administration of therapy. Band aid applied. Patient left in satisfactory condition with VSS and no s/s of distress noted.

## 2022-04-24 NOTE — Patient Instructions (Signed)
[facility]  Discharge Instructions: Thank you for choosing [facility] to provide your oncology and hematology care.  If you have a lab appointment with the [facility], please come in thru the Main Entrance and check in at the main information desk.  Wear comfortable clothing and clothing appropriate for easy access to any Portacath or PICC line.   We strive to give you quality time with your provider. You may need to reschedule your appointment if you arrive late (15 or more minutes).  Arriving late affects you and other patients whose appointments are after yours.  Also, if you miss three or more appointments without notifying the office, you may be dismissed from the clinic at the provider's discretion.      For prescription refill requests, have your pharmacy contact our office and allow 72 hours for refills to be completed.    Today you received the following 1 unit of PRBCs, Return as scheduled.   To help prevent nausea and vomiting after your treatment, we encourage you to take your nausea medication as directed.  BELOW ARE SYMPTOMS THAT SHOULD BE REPORTED IMMEDIATELY: *FEVER GREATER THAN 100.4 F (38 C) OR HIGHER *CHILLS OR SWEATING *NAUSEA AND VOMITING THAT IS NOT CONTROLLED WITH YOUR NAUSEA MEDICATION *UNUSUAL SHORTNESS OF BREATH *UNUSUAL BRUISING OR BLEEDING *URINARY PROBLEMS (pain or burning when urinating, or frequent urination) *BOWEL PROBLEMS (unusual diarrhea, constipation, pain near the anus) TENDERNESS IN MOUTH AND THROAT WITH OR WITHOUT PRESENCE OF ULCERS (sore throat, sores in mouth, or a toothache) UNUSUAL RASH, SWELLING OR PAIN  UNUSUAL VAGINAL DISCHARGE OR ITCHING   Items with * indicate a potential emergency and should be followed up as soon as possible or go to the Emergency Department if any problems should occur.  Please show the CHEMOTHERAPY ALERT CARD or IMMUNOTHERAPY ALERT CARD at check-in to the Emergency Department  and triage nurse.  Should you have questions after your visit or need to cancel or reschedule your appointment, please contact [facility] [phone]  and follow the prompts.  Office hours are 8:00 a.m. to 4:30 p.m. Monday - Friday. Please note that voicemails left after 4:00 p.m. may not be returned until the following business day.  We are closed weekends and major holidays. You have access to a nurse at all times for urgent questions. Please call the main number to the clinic [phone] and follow the prompts.  For any non-urgent questions, you may also contact your provider using MyChart. We now offer e-Visits for anyone 47 and older to request care online for non-urgent symptoms. For details visit [email or web address].   Also download the MyChart app! Go to the app store, search "MyChart", open the app, select [facility], and log in with your MyChart username and password.  Masks are optional in the cancer centers. If you would like for your care team to wear a mask while they are taking care of you, please let them know. You may have one support person who is at least 86 years old accompany you for your appointments.

## 2022-04-25 LAB — PROTEIN ELECTROPHORESIS, SERUM
A/G Ratio: 0.7 (ref 0.7–1.7)
Albumin ELP: 3.1 g/dL (ref 2.9–4.4)
Alpha-1-Globulin: 0.2 g/dL (ref 0.0–0.4)
Alpha-2-Globulin: 0.6 g/dL (ref 0.4–1.0)
Beta Globulin: 0.6 g/dL — ABNORMAL LOW (ref 0.7–1.3)
Gamma Globulin: 3 g/dL — ABNORMAL HIGH (ref 0.4–1.8)
Globulin, Total: 4.5 g/dL — ABNORMAL HIGH (ref 2.2–3.9)
M-Spike, %: 2.8 g/dL — ABNORMAL HIGH
Total Protein ELP: 7.6 g/dL (ref 6.0–8.5)

## 2022-04-27 LAB — TYPE AND SCREEN
ABO/RH(D): O POS
Antibody Screen: POSITIVE
DAT, IgG: POSITIVE
Unit division: 0
Unit division: 0

## 2022-04-27 LAB — BPAM RBC
Blood Product Expiration Date: 202401162359
Blood Product Expiration Date: 202401162359
ISSUE DATE / TIME: 202312201017
ISSUE DATE / TIME: 202312201017
Unit Type and Rh: 5100
Unit Type and Rh: 5100

## 2022-04-30 ENCOUNTER — Inpatient Hospital Stay: Payer: Medicare Other

## 2022-04-30 ENCOUNTER — Inpatient Hospital Stay (HOSPITAL_BASED_OUTPATIENT_CLINIC_OR_DEPARTMENT_OTHER): Payer: Medicare Other | Admitting: Hematology

## 2022-04-30 VITALS — BP 190/75 | HR 59 | Temp 96.4°F | Resp 18 | Ht 68.0 in | Wt 126.8 lb

## 2022-04-30 DIAGNOSIS — Z79899 Other long term (current) drug therapy: Secondary | ICD-10-CM | POA: Diagnosis not present

## 2022-04-30 DIAGNOSIS — Z95828 Presence of other vascular implants and grafts: Secondary | ICD-10-CM

## 2022-04-30 DIAGNOSIS — D630 Anemia in neoplastic disease: Secondary | ICD-10-CM | POA: Diagnosis not present

## 2022-04-30 DIAGNOSIS — Z5112 Encounter for antineoplastic immunotherapy: Secondary | ICD-10-CM | POA: Diagnosis not present

## 2022-04-30 DIAGNOSIS — C9000 Multiple myeloma not having achieved remission: Secondary | ICD-10-CM

## 2022-04-30 LAB — CBC WITH DIFFERENTIAL/PLATELET
Abs Immature Granulocytes: 0.01 10*3/uL (ref 0.00–0.07)
Basophils Absolute: 0 10*3/uL (ref 0.0–0.1)
Basophils Relative: 0 %
Eosinophils Absolute: 0 10*3/uL (ref 0.0–0.5)
Eosinophils Relative: 2 %
HCT: 25.3 % — ABNORMAL LOW (ref 39.0–52.0)
Hemoglobin: 8.5 g/dL — ABNORMAL LOW (ref 13.0–17.0)
Immature Granulocytes: 0 %
Lymphocytes Relative: 38 %
Lymphs Abs: 1 10*3/uL (ref 0.7–4.0)
MCH: 34.3 pg — ABNORMAL HIGH (ref 26.0–34.0)
MCHC: 33.6 g/dL (ref 30.0–36.0)
MCV: 102 fL — ABNORMAL HIGH (ref 80.0–100.0)
Monocytes Absolute: 0.4 10*3/uL (ref 0.1–1.0)
Monocytes Relative: 14 %
Neutro Abs: 1.2 10*3/uL — ABNORMAL LOW (ref 1.7–7.7)
Neutrophils Relative %: 46 %
Platelets: 26 10*3/uL — CL (ref 150–400)
RBC: 2.48 MIL/uL — ABNORMAL LOW (ref 4.22–5.81)
Smear Review: DECREASED
WBC: 2.6 10*3/uL — ABNORMAL LOW (ref 4.0–10.5)
nRBC: 0 % (ref 0.0–0.2)

## 2022-04-30 LAB — COMPREHENSIVE METABOLIC PANEL
ALT: 39 U/L (ref 0–44)
AST: 31 U/L (ref 15–41)
Albumin: 2.7 g/dL — ABNORMAL LOW (ref 3.5–5.0)
Alkaline Phosphatase: 51 U/L (ref 38–126)
Anion gap: 2 — ABNORMAL LOW (ref 5–15)
BUN: 23 mg/dL (ref 8–23)
CO2: 23 mmol/L (ref 22–32)
Calcium: 8.4 mg/dL — ABNORMAL LOW (ref 8.9–10.3)
Chloride: 108 mmol/L (ref 98–111)
Creatinine, Ser: 0.77 mg/dL (ref 0.61–1.24)
GFR, Estimated: >60 mL/min (ref >60)
Glucose, Bld: 96 mg/dL (ref 70–99)
Potassium: 3.4 mmol/L — ABNORMAL LOW (ref 3.5–5.1)
Sodium: 133 mmol/L — ABNORMAL LOW (ref 135–145)
Total Bilirubin: 0.6 mg/dL (ref 0.3–1.2)
Total Protein: 8.5 g/dL — ABNORMAL HIGH (ref 6.5–8.1)

## 2022-04-30 LAB — SAMPLE TO BLOOD BANK

## 2022-04-30 LAB — MAGNESIUM: Magnesium: 2 mg/dL (ref 1.7–2.4)

## 2022-04-30 MED ORDER — SODIUM CHLORIDE 0.9% FLUSH
10.0000 mL | Freq: Once | INTRAVENOUS | Status: AC
  Administered 2022-04-30: 10 mL via INTRAVENOUS

## 2022-04-30 MED ORDER — HEPARIN SOD (PORK) LOCK FLUSH 100 UNIT/ML IV SOLN
500.0000 [IU] | Freq: Once | INTRAVENOUS | Status: AC
  Administered 2022-04-30: 500 [IU] via INTRAVENOUS

## 2022-04-30 MED ORDER — SODIUM CHLORIDE 0.9% FLUSH
10.0000 mL | INTRAVENOUS | Status: DC | PRN
  Administered 2022-04-30: 10 mL via INTRAVENOUS

## 2022-04-30 NOTE — Progress Notes (Signed)
CRITICAL VALUE ALERT Critical value received:  Platelets 26. Date of notification:  04-30-2022. Time of notification: 09:02 am. Critical value read back:  Yes.   Nurse who received alert:  Stevphen Rochester RN. Leona Singleton Rn.  MD notified time and response:  Dr. Delton Coombes / M. Edwards RN @ 09:18 am. Treatment held today and patient discharged.

## 2022-04-30 NOTE — Patient Instructions (Signed)
[facility]  Discharge Instructions: Thank you for choosing [facility] to provide your oncology and hematology care.  If you have a lab appointment with the Cancer Center, please come in thru the Main Entrance and check in at the main information desk.  Wear comfortable clothing and clothing appropriate for easy access to any Portacath or PICC line.   We strive to give you quality time with your provider. You may need to reschedule your appointment if you arrive late (15 or more minutes).  Arriving late affects you and other patients whose appointments are after yours.  Also, if you miss three or more appointments without notifying the office, you may be dismissed from the clinic at the provider's discretion.      For prescription refill requests, have your pharmacy contact our office and allow 72 hours for refills to be completed.     To help prevent nausea and vomiting after your treatment, we encourage you to take your nausea medication as directed.  BELOW ARE SYMPTOMS THAT SHOULD BE REPORTED IMMEDIATELY: *FEVER GREATER THAN 100.4 F (38 C) OR HIGHER *CHILLS OR SWEATING *NAUSEA AND VOMITING THAT IS NOT CONTROLLED WITH YOUR NAUSEA MEDICATION *UNUSUAL SHORTNESS OF BREATH *UNUSUAL BRUISING OR BLEEDING *URINARY PROBLEMS (pain or burning when urinating, or frequent urination) *BOWEL PROBLEMS (unusual diarrhea, constipation, pain near the anus) TENDERNESS IN MOUTH AND THROAT WITH OR WITHOUT PRESENCE OF ULCERS (sore throat, sores in mouth, or a toothache) UNUSUAL RASH, SWELLING OR PAIN  UNUSUAL VAGINAL DISCHARGE OR ITCHING   Items with * indicate a potential emergency and should be followed up as soon as possible or go to the Emergency Department if any problems should occur.  Please show the CHEMOTHERAPY ALERT CARD or IMMUNOTHERAPY ALERT CARD at check-in to the Emergency Department and triage nurse.  Should you have questions after your visit or need to  cancel or reschedule your appointment, please contact [facility] [phone]  and follow the prompts.  Office hours are 8:00 a.m. to 4:30 p.m. Monday - Friday. Please note that voicemails left after 4:00 p.m. may not be returned until the following business day.  We are closed weekends and major holidays. You have access to a nurse at all times for urgent questions. Please call the main number to the clinic [phone] and follow the prompts.  For any non-urgent questions, you may also contact your provider using MyChart. We now offer e-Visits for anyone 18 and older to request care online for non-urgent symptoms. For details visit [email or web address].   Also download the MyChart app! Go to the app store, search "MyChart", open the app, select [facility], and log in with your MyChart username and password.  Masks are optional in the cancer centers. If you would like for your care team to wear a mask while they are taking care of you, please let them know. You may have one support person who is at least 86 years old accompany you for your appointments.  

## 2022-04-30 NOTE — Progress Notes (Signed)
No treatment today per Dr. Delton Coombes.  Port flushed without difficulty and good blood return noted.  Port deaccessed.  Patient discharged ambulatory in stable condition.

## 2022-04-30 NOTE — Progress Notes (Signed)
Patient seen and examined today by Dr. Delton Coombes. No treatment today. Primary RN and Pharmacy made aware.

## 2022-04-30 NOTE — Progress Notes (Signed)
Dale [name], Dale [name] [id number]   CLINIC:  Medical Oncology/Hematology  PCP:  No primary care provider on file. No primary physician on file. None   REASON FOR VISIT:  Follow-up for multiple myeloma and anemia  PRIOR THERAPY: none  CURRENT THERAPY: Weekly Velcade and dexamethasone  BRIEF ONCOLOGIC HISTORY:  Oncology History  Multiple myeloma ([facility])  01/15/2021 Initial Diagnosis   Multiple myeloma ([facility])   06/25/2021 - 12/06/2021 Chemotherapy   Patient is on Treatment Plan : MYELOMA NON-TRANSPLANT CANDIDATES VRd weekly q21d     12/20/2021 -  Chemotherapy   Patient is on Treatment Plan : MYELOMA Daratumumab IV q28d       CANCER STAGING:  Cancer Staging  No matching staging information was found for the patient.  INTERVAL HISTORY:  Dale [name], a 88 y.o. male, seen for follow-up of multiple myeloma and toxicity assessment prior to next treatment.  Reports energy levels 50%.  He received blood transfusion last week.  Denies any falls at home since then.  Denies any easy bruising or bleeding.  REVIEW OF SYSTEMS:  Review of Systems  All other systems reviewed and are negative.   PAST MEDICAL/SURGICAL HISTORY:  Past Medical History:  Diagnosis Date   Allergic rhinitis    Anal fissure    Anginal pain (HCC)    Asthma    Cancer (HCC)    skin   Chest pain    CHF (congestive heart failure) ([facility])    Coronary heart disease    s/p stenting. cath in 01/2012 noncritical occlusion   Dyspnea    Dysrhythmia    1st degree heart block   GERD (gastroesophageal reflux disease)    Glaucoma    Hiatal hernia    Hyperlipidemia    Hypertension    Hypothyroidism    Idiopathic thrombocytopenic purpura ([facility]) 2002   Macular degeneration    Nephrolithiasis    PUD (peptic ulcer disease)    remote   Sarcoidosis    pulmonary   Schatzki's ring    Past Surgical History:  Procedure Laterality Date   cardiac stents     COLONOSCOPY  10/30/2006    Normal rectum, sigmoid diverticula.Remainder of colonic mucosa appeared normal.   CORONARY ANGIOPLASTY WITH STENT PLACEMENT     about 10 years ago per pt (around 2007)   [facility], URETEROSCOPY AND STENT PLACEMENT Left 06/16/2017   Procedure: CYSTOSCOPY WITH RETROGRADE PYELOGRAM, URETEROSCOPY,STONE EXTRACTION  AND STENT PLACEMENT;  Surgeon: Franchot Gallo, MD;  Location: [facility];  Service: Urology;  Laterality: Left;   ESOPHAGOGASTRODUODENOSCOPY  06/19/2004   Two esophageal rings and esophageal web as described above.  All of these were disrupted by passing 56-French Venia Minks dilator/ Candida esophagitis,which appears to be incidental given history of   antibiotic use, but nevertheless will be treated.   ESOPHAGOGASTRODUODENOSCOPY  10/30/2006   Distal tandem esophageal ring status post dilation disruption as  described above.  Otherwise normal esophagus/  Small hiatal hernia otherwise normal stomach, D1 and D2   ESOPHAGOGASTRODUODENOSCOPY N/A 03/22/2015   Dr.Rourk- noncritical schatzki's ring and hiatal hernia-o/w normal EGD.    ESOPHAGOGASTRODUODENOSCOPY (EGD) WITH ESOPHAGEAL DILATION  03/04/2012   RMR- schatzki's ring, hiatal hernia, polypoid gastric mucosa, bx= minimally active gastritis.   HOLMIUM LASER APPLICATION Left 07/19/1759   Procedure: HOLMIUM LASER APPLICATION;  Surgeon: Franchot Gallo, MD;  Location: [facility];  Service: Urology;  Laterality: Left;   IR CV LINE INJECTION  12/27/2021  IR GENERIC HISTORICAL  03/06/2016   IR RADIOLOGIST EVAL & MGMT 03/06/2016 Aletta Edouard, MD [facility] INTERV RAD   IR GENERIC HISTORICAL  06/18/2016   IR RADIOLOGIST EVAL & MGMT 06/18/2016 Aletta Edouard, MD [facility] INTERV RAD   IR IMAGING GUIDED PORT INSERTION  12/18/2021   IR IMAGING GUIDED PORT INSERTION  01/15/2022   IR RADIOLOGIST EVAL & MGMT  10/01/2016   IR RADIOLOGIST EVAL & MGMT  10/15/2017   IR RADIOLOGIST EVAL & MGMT  12/24/2018   IR RADIOLOGIST EVAL & MGMT  01/04/2020    IR RADIOLOGIST EVAL & MGMT  06/20/2021   IR REMOVAL TUN ACCESS W/ PORT W/O FL MOD SED  01/15/2022   LEFT HEART CATH N/A 02/02/2012   Procedure: LEFT HEART CATH;  Surgeon: Lorretta Harp, MD;  Location: [facility] CATH LAB;  Service: Cardiovascular;  Laterality: N/A;   MEDIASTINOSCOPY     for dx sarcoid   RADIOLOGY WITH ANESTHESIA Left 05/17/2016   Procedure: left renal ablation;  Surgeon: Aletta Edouard, MD;  Location: [facility];  Service: Radiology;  Laterality: Left;    SOCIAL HISTORY:  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    Employer: RETIRED  Tobacco Use   Smoking status: Former    Packs/day: 0.10    Years: 2.00    Total pack years: 0.20    Types: Cigarettes, Cigars    Quit date: 05/06/1970    Years since quitting: 52.0   Smokeless tobacco: Never  Vaping Use   Vaping Use: Never used  Substance and Sexual Activity   Alcohol use: No    Alcohol/week: 0.0 standard drinks of alcohol   Drug use: No   Sexual activity: Never  Other Topics Concern   Not on file  Social History Narrative   Not on file   Social Determinants of Health   Financial Resource Strain: Not on file  Food Insecurity: No Food Insecurity (02/06/2022)   Hunger Vital Sign    Worried About Running Out of Food in the Last Year: Never true    Ran Out of Food in the Last Year: Never true  Transportation Needs: No Transportation Needs (02/06/2022)   PRAPARE - Hydrologist (Medical): No    Lack of Transportation (Non-Medical): No  Physical Activity: Not on file  Stress: Not on file  Social Connections: Not on file  Intimate Partner Violence: Not At Risk (02/06/2022)   Humiliation, Afraid, Rape, and Kick questionnaire    Fear of Current or Ex-Partner: No     Emotionally Abused: No    Physically Abused: No    Sexually Abused: No    FAMILY HISTORY:  Family History  Problem Relation Age of Onset   Heart disease Father        deceased age 20   Stroke Mother    Alzheimer's disease Mother    Heart attack Brother        deceased age 44   Cancer Other        niece   Colon cancer Neg Hx     CURRENT MEDICATIONS:  Current Outpatient Medications  Medication Sig Dispense Refill   acetaminophen (TYLENOL) 325 MG tablet Take 2 tablets (650  mg total) by mouth every 6 (six) hours as needed for mild pain (or Fever >/= 101).     acyclovir (ZOVIRAX) 400 MG tablet TAKE 1 TABLET BY MOUTH TWICE  DAILY 180 tablet 0   Ascorbic Acid (VITAMIN C PO) Take 500 mg by mouth every evening.     atorvastatin (LIPITOR) 40 MG tablet TAKE 1 TABLET BY MOUTH IN  THE EVENING (Patient taking differently: Take 40 mg by mouth daily.) 90 tablet 3   Bortezomib (VELCADE IJ) Inject as directed once a week.     brimonidine (ALPHAGAN) 0.2 % ophthalmic solution Place 1 drop into the left eye 2 (two) times daily.     cyanocobalamin 1000 MCG tablet Take 1 tablet (1,000 mcg total) by mouth daily. 30 tablet 3   dexamethasone (DECADRON) 2 MG tablet TAKE 5 TABLETS BY MOUTH ONCE  WEEKLY 60 tablet 0   dorzolamide-timolol (COSOPT) 22.3-6.8 MG/ML ophthalmic solution Place 1 drop into the left eye 2 (two) times daily.     feeding supplement (ENSURE ENLIVE / ENSURE PLUS) LIQD Take 237 mLs by mouth 3 (three) times daily between meals.     isosorbide mononitrate (IMDUR) 60 MG 24 hr tablet Take 1 tablet (60 mg total) by mouth daily. TAKE 1 AND 1/2 TABLETS BY  MOUTH IN THE MORNING AND  1/2 TABLET AT NIGHT     levothyroxine (SYNTHROID) 75 MCG tablet Take 75 mcg by mouth daily before breakfast.     losartan (COZAAR) 100 MG tablet TAKE 1 TABLET BY MOUTH DAILY 90 tablet 3   metoprolol tartrate (LOPRESSOR) 25 MG tablet TAKE 1 TABLET BY MOUTH IN  THE MORNING AND ONE-HALF  TABLET BY MOUTH IN THE  EVENING  (Patient taking differently: Take 12.5-25 mg by mouth in the morning and at bedtime. TAKE 1 TABLET BY MOUTH IN  THE MORNING AND ONE-HALF  TABLET BY MOUTH IN THE  EVENING) 135 tablet 3   Multiple Vitamins-Minerals (PRESERVISION/LUTEIN) CAPS Take 1 capsule by mouth 2 (two) times daily.     pantoprazole (PROTONIX) 40 MG tablet TAKE 1 TABLET BY MOUTH DAILY  BEFORE BREAKFAST 90 tablet 3   potassium chloride SA (KLOR-CON M) 20 MEQ tablet TAKE 1 TABLET BY MOUTH  DAILY (Patient taking differently: Take 20 mEq by mouth daily.) 90 tablet 3   ROCKLATAN 0.02-0.005 % SOLN Place 1 drop into the left eye at bedtime.     triamcinolone (KENALOG) 0.1 % cream Apply 1 application. topically 2 (two) times daily as needed (for irritation).     vitamin E 200 UNIT capsule Take 200 Units by mouth every evening.     lidocaine-prilocaine (EMLA) cream Apply 1 Application topically as needed. (Patient not taking: Reported on 04/30/2022) 30 g 1   nitroGLYCERIN (NITROSTAT) 0.4 MG SL tablet Place 1 tablet (0.4 mg total) under the tongue every 5 (five) minutes as needed. For chest pain (Patient not taking: Reported on 04/30/2022) 25 tablet 2   prochlorperazine (COMPAZINE) 10 MG tablet Take 1 tablet (10 mg total) by mouth every 6 (six) hours as needed for nausea or vomiting. (Patient not taking: Reported on 04/30/2022) 30 tablet 3   No current facility-administered medications for this visit.   Facility-Administered Medications Ordered in Other Visits  Medication Dose Route Frequency Provider Last Rate Last Admin   acetaminophen (TYLENOL) 325 MG tablet            diphenhydrAMINE (BENADRYL) 25 mg capsule            montelukast (  SINGULAIR) 10 MG tablet             ALLERGIES:  Allergies  Allergen Reactions   Azithromycin Other (See Comments)    Sore mouth and fever blisters around mouth, sores in nose area as well   Doxazosin Shortness Of Breath   Atenolol Other (See Comments)    UNKNOWN REACTION   Hydrocodone Nausea And  Vomiting   Levofloxacin Other (See Comments)    Caused stomach problems.   Morphine Other (See Comments)    "made me crazy"   Penicillins Nausea And Vomiting and Other (See Comments)    Has patient had a PCN reaction causing immediate rash, facial/tongue/throat swelling, SOB or lightheadedness with hypotension: No Has patient had a PCN reaction causing severe rash involving mucus membranes or skin necrosis: No Has patient had a PCN reaction that required hospitalization No Has patient had a PCN reaction occurring within the last 10 years: No If all of the above answers are "NO", then may proceed with Cephalosporin use.    Sulfonamide Derivatives Nausea And Vomiting    PHYSICAL EXAM:  Performance status (ECOG): 0 - Asymptomatic  There were no vitals filed for this visit.  Wt Readings from Last 3 Encounters:  04/30/22 126 lb 12.8 oz (57.5 kg)  04/23/22 127 lb 9.6 oz (57.9 kg)  04/15/22 127 lb 12.8 oz (58 kg)   Physical Exam Vitals reviewed.  Constitutional:      Appearance: Normal appearance.  Cardiovascular:     Rate and Rhythm: Normal rate and regular rhythm.     Pulses: Normal pulses.     Heart sounds: Normal heart sounds.  Pulmonary:     Effort: Pulmonary effort is normal.     Breath sounds: Normal breath sounds.  Neurological:     General: No focal deficit present.     Mental Status: He is alert and oriented to person, place, and time.  Psychiatric:        Mood and Affect: Mood normal.        Behavior: Behavior normal.     LABORATORY DATA:  I have reviewed the labs as listed.     Latest Ref Rng & Units 04/30/2022    7:58 AM 04/23/2022    8:56 AM 04/15/2022    8:10 AM  CBC  WBC 4.0 - 10.5 K/uL 2.6  5.1  3.7   Hemoglobin 13.0 - 17.0 g/dL 8.5  7.4  8.9   Hematocrit 39.0 - 52.0 % 25.3  22.9  27.2   Platelets 150 - 400 K/uL 26  36  89       Latest Ref Rng & Units 04/30/2022    7:58 AM 04/23/2022    8:56 AM 04/15/2022    8:02 AM  CMP  Glucose 70 - 99 mg/dL  96  106  114   BUN 8 - 23 mg/dL _0 Creatinine 0.61 - 1.24 mg/dL 0.77  0.76  0.77   Sodium 135 - 145 mmol/L 133  134  133   Potassium 3.5 - 5.1 mmol/L 3.4  3.6  3.7   Chloride 98 - 111 mmol/L 108  108  106   CO2 22 - 32 mmol/L _1 Calcium 8.9 - 10.3 mg/dL 8.4  8.5  8.6   Total Protein 6.5 - 8.1 g/dL 8.5  7.8  8.7   Total Bilirubin 0.3 - 1.2 mg/dL 0.6  0.7  0.5   Alkaline Phos 38 -  126 U/L 51  51  59   AST 15 - 41 U/L _0 ALT 0 - 44 U/L 39  28  24     DIAGNOSTIC IMAGING:  I have independently reviewed the scans and discussed with the patient. No results found.   ASSESSMENT:  1.  IgG lambda plasma cell myeloma: - Work-up for macrocytic anemia on 12/01/2020 showed M spike of 2.9 g.  Immunofixation IgG lambda. - Lambda light chains elevated at 284.  Light chain ratio was 0.04.  LDH was 163.  Creatinine was 0.9 and calcium 8.9. - Bone marrow biopsy on 12/18/2020-hypercellular marrow for age with 48% atypical plasma cells with lambda light chain restriction. - Chromosome analysis was normal. 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 - Revlimid 5 mg, 2 weeks on/1 week off started on 01/20/2021.  Dexamethasone weekly 10 mg added on 02/07/2021.  Revlimid is discontinued around 06/15/2021 due to poor tolerance and ineffectiveness at low-dose.  Thrombocytopenia and anemia. - Velcade weekly on days 1, 8, 15 every 21 days along with dexamethasone 10 mg weekly started on 06/25/2021.  2.  Macrocytic anemia: - CBC on 12/01/2020 with hemoglobin 8.5 and MCV of 117. - Denies any bleeding per rectum or melena.  3.  Social/family history: - Lives at home with his wife.  He does all ADLs and IADLs.  He even does yard work. - He worked at [employer] for 35 years.  Denies any chemical exposure.  Non-smoker. - No family history of malignancies.   PLAN:  1.  IgG lambda plasma cell myeloma, high  risk: - Last week we have held Velcade and Darzalex as his platelet count was 36,000. - Reviewed myeloma labs from 04/23/2022.  M spike is 2.8, down from 3.7 g previously.  Lambda light chains are 640 and ratio is 0.00. - CBC today shows PLT 2 26,000.  White count is 2.6 with ANC 1.2.  Creatinine and calcium are normal. - We will hold treatment today.  Will repeat CBC next week.  Will restart treatment if plt more than 40 K.  RTC 2 weeks for follow-up.   2.  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 - Last transfusion on 04/23/2022.  Hemoglobin today is 8.5.  Continue weekly CBC checks and possible transfusion.  3.  Thromboprophylaxis: - Continue aspirin 81 mg daily.  4.  Right ankle swelling: - Continue Lasix daily as needed.  5.  ID prophylaxis: - Continue acyclovir twice daily.  6.  Right breast lump: - Noticed during the week of 04/15/2022. - Examination today: Resolution of lump at 12 o'clock position.  Nontender.   Orders placed this encounter:  Orders Placed This Encounter  Procedures   CBC with Differential        Derek Jack, MD [facility] [phone]

## 2022-04-30 NOTE — Patient Instructions (Signed)
[facility]  Discharge Instructions  You were seen and examined today by Dr. Delton Coombes.  Your platelets are too low for treatment today. You do not get treatment today or any transfusions.  Please take dexamethasone (5 steroid pills) on the day of your injections here at the [facility].  Follow-up as scheduled.  Thank you for choosing [facility] to provide your oncology and hematology care.   To afford each patient quality time with our provider, please arrive at least 15 minutes before your scheduled appointment time. You may need to reschedule your appointment if you arrive late (10 or more minutes). Arriving late affects you and other patients whose appointments are after yours.  Also, if you miss three or more appointments without notifying the office, you may be dismissed from the clinic at the provider's discretion.    Again, thank you for choosing [facility].  Our hope is that these requests will decrease the amount of time that you wait before being seen by our physicians.   If you have a lab appointment with the [facility] please come in thru the Main Entrance and check in at the main information desk.           _____________________________________________________________  Should you have questions after your visit to [facility], please contact our office at [phone] and follow the prompts.  Our office hours are 8:00 a.m. to 4:30 p.m. Monday - Thursday and 8:00 a.m. to 2:30 p.m. Friday.  Please note that voicemails left after 4:00 p.m. may not be returned until the following business day.  We are closed weekends and all major holidays.  You do have access to a nurse 24-7, just call the main number to the clinic [phone] and do not press any options, hold on the line and a nurse will answer the phone.    For prescription refill requests, have your pharmacy contact our office and allow  72 hours.    Masks are optional in the cancer centers. If you would like for your care team to wear a mask while they are taking care of you, please let them know. You may have one support person who is at least 86 years old accompany you for your appointments.

## 2022-05-08 ENCOUNTER — Inpatient Hospital Stay: Payer: Medicare Other | Admitting: Hematology

## 2022-05-08 ENCOUNTER — Inpatient Hospital Stay: Payer: Medicare Other | Attending: Hematology

## 2022-05-08 ENCOUNTER — Inpatient Hospital Stay: Payer: Medicare Other

## 2022-05-08 VITALS — BP 152/59 | HR 68 | Temp 97.5°F | Resp 16 | Wt 129.4 lb

## 2022-05-08 VITALS — BP 132/53 | HR 74 | Temp 98.2°F | Resp 18

## 2022-05-08 DIAGNOSIS — D630 Anemia in neoplastic disease: Secondary | ICD-10-CM | POA: Diagnosis not present

## 2022-05-08 DIAGNOSIS — C9000 Multiple myeloma not having achieved remission: Secondary | ICD-10-CM

## 2022-05-08 DIAGNOSIS — Z79899 Other long term (current) drug therapy: Secondary | ICD-10-CM | POA: Insufficient documentation

## 2022-05-08 DIAGNOSIS — Z5112 Encounter for antineoplastic immunotherapy: Secondary | ICD-10-CM | POA: Diagnosis not present

## 2022-05-08 DIAGNOSIS — Z95828 Presence of other vascular implants and grafts: Secondary | ICD-10-CM

## 2022-05-08 LAB — CBC WITH DIFFERENTIAL/PLATELET
Abs Immature Granulocytes: 0.04 10*3/uL (ref 0.00–0.07)
Basophils Absolute: 0 10*3/uL (ref 0.0–0.1)
Basophils Relative: 0 %
Eosinophils Absolute: 0 10*3/uL (ref 0.0–0.5)
Eosinophils Relative: 1 %
HCT: 22.9 % — ABNORMAL LOW (ref 39.0–52.0)
Hemoglobin: 7.6 g/dL — ABNORMAL LOW (ref 13.0–17.0)
Immature Granulocytes: 1 %
Lymphocytes Relative: 14 %
Lymphs Abs: 0.6 10*3/uL — ABNORMAL LOW (ref 0.7–4.0)
MCH: 34.5 pg — ABNORMAL HIGH (ref 26.0–34.0)
MCHC: 33.2 g/dL (ref 30.0–36.0)
MCV: 104.1 fL — ABNORMAL HIGH (ref 80.0–100.0)
Monocytes Absolute: 0.8 10*3/uL (ref 0.1–1.0)
Monocytes Relative: 18 %
Neutro Abs: 3 10*3/uL (ref 1.7–7.7)
Neutrophils Relative %: 66 %
Platelets: 69 10*3/uL — ABNORMAL LOW (ref 150–400)
RBC: 2.2 MIL/uL — ABNORMAL LOW (ref 4.22–5.81)
WBC: 4.5 10*3/uL (ref 4.0–10.5)
nRBC: 0 % (ref 0.0–0.2)

## 2022-05-08 LAB — COMPREHENSIVE METABOLIC PANEL
ALT: 43 U/L (ref 0–44)
AST: 25 U/L (ref 15–41)
Albumin: 2.5 g/dL — ABNORMAL LOW (ref 3.5–5.0)
Alkaline Phosphatase: 52 U/L (ref 38–126)
Anion gap: 5 (ref 5–15)
BUN: 21 mg/dL (ref 8–23)
CO2: 23 mmol/L (ref 22–32)
Calcium: 8.2 mg/dL — ABNORMAL LOW (ref 8.9–10.3)
Chloride: 103 mmol/L (ref 98–111)
Creatinine, Ser: 0.68 mg/dL (ref 0.61–1.24)
GFR, Estimated: >60 mL/min (ref >60)
Glucose, Bld: 92 mg/dL (ref 70–99)
Potassium: 3.8 mmol/L (ref 3.5–5.1)
Sodium: 131 mmol/L — ABNORMAL LOW (ref 135–145)
Total Bilirubin: 0.6 mg/dL (ref 0.3–1.2)
Total Protein: 8.3 g/dL — ABNORMAL HIGH (ref 6.5–8.1)

## 2022-05-08 LAB — PREPARE RBC (CROSSMATCH)

## 2022-05-08 LAB — SAMPLE TO BLOOD BANK

## 2022-05-08 LAB — MAGNESIUM: Magnesium: 2 mg/dL (ref 1.7–2.4)

## 2022-05-08 MED ORDER — SODIUM CHLORIDE 0.9 % IV SOLN: Freq: Once | INTRAVENOUS | Status: AC

## 2022-05-08 MED ORDER — BORTEZOMIB CHEMO SQ INJECTION 3.5 MG (2.5MG/ML)
1.0000 mg/m2 | Freq: Once | INTRAMUSCULAR | Status: AC
  Administered 2022-05-08: 1.75 mg via SUBCUTANEOUS
  Filled 2022-05-08: qty 0.7

## 2022-05-08 MED ORDER — METHYLPREDNISOLONE SODIUM SUCC 125 MG IJ SOLR
100.0000 mg | Freq: Once | INTRAMUSCULAR | Status: AC
  Administered 2022-05-08: 100 mg via INTRAVENOUS
  Filled 2022-05-08: qty 2

## 2022-05-08 MED ORDER — SODIUM CHLORIDE 0.9% FLUSH
10.0000 mL | INTRAVENOUS | Status: DC | PRN
  Administered 2022-05-08: 10 mL

## 2022-05-08 MED ORDER — CETIRIZINE HCL 10 MG/ML IV SOLN
10.0000 mg | Freq: Once | INTRAVENOUS | Status: AC
  Administered 2022-05-08: 10 mg via INTRAVENOUS
  Filled 2022-05-08: qty 1

## 2022-05-08 MED ORDER — SODIUM CHLORIDE 0.9 % IV SOLN
16.0000 mg/kg | Freq: Once | INTRAVENOUS | Status: AC
  Administered 2022-05-08: 900 mg via INTRAVENOUS
  Filled 2022-05-08: qty 40

## 2022-05-08 MED ORDER — ONDANSETRON HCL 4 MG/2ML IJ SOLN
8.0000 mg | Freq: Once | INTRAMUSCULAR | Status: AC
  Administered 2022-05-08: 8 mg via INTRAVENOUS
  Filled 2022-05-08: qty 4

## 2022-05-08 MED ORDER — ACETAMINOPHEN 325 MG PO TABS
650.0000 mg | ORAL_TABLET | Freq: Once | ORAL | Status: AC
  Administered 2022-05-08: 650 mg via ORAL
  Filled 2022-05-08: qty 2

## 2022-05-08 MED ORDER — SODIUM CHLORIDE 0.9% FLUSH
10.0000 mL | Freq: Once | INTRAVENOUS | Status: AC
  Administered 2022-05-08: 10 mL via INTRAVENOUS

## 2022-05-08 MED ORDER — DIPHENHYDRAMINE HCL 25 MG PO CAPS: 50.0000 mg | ORAL_CAPSULE | Freq: Once | ORAL | Status: DC

## 2022-05-08 MED ORDER — HEPARIN SOD (PORK) LOCK FLUSH 100 UNIT/ML IV SOLN
500.0000 [IU] | Freq: Once | INTRAVENOUS | Status: AC | PRN
  Administered 2022-05-08: 500 [IU]

## 2022-05-08 NOTE — Progress Notes (Signed)
Hemoglobin is 7.6 today, will give one unit of blood tomorrow per MD.

## 2022-05-08 NOTE — Progress Notes (Signed)
Discontinue benadryl oral and change to Quzyttir 10 mg IV x 1 prior to chemotherapy  T.O. Dr Rhys Martini, PharmD

## 2022-05-08 NOTE — Patient Instructions (Signed)
[facility]  Discharge Instructions: Thank you for choosing [facility] to provide your oncology and hematology care.  If you have a lab appointment with the [facility], please come in thru the Main Entrance and check in at the main information desk.  Wear comfortable clothing and clothing appropriate for easy access to any Portacath or PICC line.   We strive to give you quality time with your provider. You may need to reschedule your appointment if you arrive late (15 or more minutes).  Arriving late affects you and other patients whose appointments are after yours.  Also, if you miss three or more appointments without notifying the office, you may be dismissed from the clinic at the provider's discretion.      For prescription refill requests, have your pharmacy contact our office and allow 72 hours for refills to be completed.    Today you received the following chemotherapy and/or immunotherapy agents Velcade dara      To help prevent nausea and vomiting after your treatment, we encourage you to take your nausea medication as directed.  BELOW ARE SYMPTOMS THAT SHOULD BE REPORTED IMMEDIATELY: *FEVER GREATER THAN 100.4 F (38 C) OR HIGHER *CHILLS OR SWEATING *NAUSEA AND VOMITING THAT IS NOT CONTROLLED WITH YOUR NAUSEA MEDICATION *UNUSUAL SHORTNESS OF BREATH *UNUSUAL BRUISING OR BLEEDING *URINARY PROBLEMS (pain or burning when urinating, or frequent urination) *BOWEL PROBLEMS (unusual diarrhea, constipation, pain near the anus) TENDERNESS IN MOUTH AND THROAT WITH OR WITHOUT PRESENCE OF ULCERS (sore throat, sores in mouth, or a toothache) UNUSUAL RASH, SWELLING OR PAIN  UNUSUAL VAGINAL DISCHARGE OR ITCHING   Items with * indicate a potential emergency and should be followed up as soon as possible or go to the Emergency Department if any problems should occur.  Please show the CHEMOTHERAPY ALERT CARD or IMMUNOTHERAPY ALERT CARD at check-in to the  Emergency Department and triage nurse.  Should you have questions after your visit or need to cancel or reschedule your appointment, please contact [facility] [phone]  and follow the prompts.  Office hours are 8:00 a.m. to 4:30 p.m. Monday - Friday. Please note that voicemails left after 4:00 p.m. may not be returned until the following business day.  We are closed weekends and major holidays. You have access to a nurse at all times for urgent questions. Please call the main number to the clinic [phone] and follow the prompts.  For any non-urgent questions, you may also contact your provider using MyChart. We now offer e-Visits for anyone 27 and older to request care online for non-urgent symptoms. For details visit [email or web address].   Also download the MyChart app! Go to the app store, search "MyChart", open the app, select [facility], and log in with your MyChart username and password.

## 2022-05-08 NOTE — Progress Notes (Signed)
Patient presents today for Daratumumab IV per providers order.  Vital signs within parameters for treatment.  Labs pending.  Patient has no new complaints at this time.  Labs within parameters for treatment, except platelets 69, MD notified and message received from [place] okay to proceed with treatment.  Velcade administration without incident; injection site WNL; see MAR for injection details.  Patient tolerated procedure well and without incident.   Treatment given today per MD orders.  Stable during infusion without adverse affects.  Vital signs stable.  No complaints at this time.  Discharge from clinic ambulatory in stable condition.  Alert and oriented X 3.  Follow up with [facility] as scheduled.

## 2022-05-09 ENCOUNTER — Inpatient Hospital Stay: Payer: Medicare Other

## 2022-05-09 DIAGNOSIS — Z79899 Other long term (current) drug therapy: Secondary | ICD-10-CM | POA: Diagnosis not present

## 2022-05-09 DIAGNOSIS — D630 Anemia in neoplastic disease: Secondary | ICD-10-CM | POA: Diagnosis not present

## 2022-05-09 DIAGNOSIS — Z5112 Encounter for antineoplastic immunotherapy: Secondary | ICD-10-CM | POA: Diagnosis not present

## 2022-05-09 DIAGNOSIS — C9000 Multiple myeloma not having achieved remission: Secondary | ICD-10-CM | POA: Diagnosis not present

## 2022-05-09 MED ORDER — ACETAMINOPHEN 325 MG PO TABS
650.0000 mg | ORAL_TABLET | Freq: Once | ORAL | Status: AC
  Administered 2022-05-09: 650 mg via ORAL
  Filled 2022-05-09: qty 2

## 2022-05-09 MED ORDER — SODIUM CHLORIDE 0.9% FLUSH
10.0000 mL | INTRAVENOUS | Status: AC | PRN
  Administered 2022-05-09: 10 mL

## 2022-05-09 MED ORDER — HEPARIN SOD (PORK) LOCK FLUSH 100 UNIT/ML IV SOLN
500.0000 [IU] | Freq: Every day | INTRAVENOUS | Status: AC | PRN
  Administered 2022-05-09: 500 [IU]

## 2022-05-09 MED ORDER — DIPHENHYDRAMINE HCL 25 MG PO CAPS
25.0000 mg | ORAL_CAPSULE | Freq: Once | ORAL | Status: AC
  Administered 2022-05-09: 25 mg via ORAL
  Filled 2022-05-09: qty 1

## 2022-05-09 MED ORDER — SODIUM CHLORIDE 0.9% IV SOLUTION
250.0000 mL | Freq: Once | INTRAVENOUS | Status: AC
  Administered 2022-05-09: 250 mL via INTRAVENOUS

## 2022-05-09 NOTE — Progress Notes (Signed)
Pt presents today for 1 unit of blood per provider's order. Vital signs stable and pt voiced no new complaints at this time.  1 unit of blood given today per MD orders. Tolerated infusion without adverse affects. Vital signs stable. No complaints at this time. Discharged from clinic ambulatory in stable condition. Alert and oriented x 3. F/U with [facility] as scheduled.   

## 2022-05-09 NOTE — Patient Instructions (Signed)
[facility]  Discharge Instructions: Thank you for choosing [facility] to provide your oncology and hematology care.  If you have a lab appointment with the Cancer Center, please come in thru the Main Entrance and check in at the main information desk.  Wear comfortable clothing and clothing appropriate for easy access to any Portacath or PICC line.   We strive to give you quality time with your provider. You may need to reschedule your appointment if you arrive late (15 or more minutes).  Arriving late affects you and other patients whose appointments are after yours.  Also, if you miss three or more appointments without notifying the office, you may be dismissed from the clinic at the provider's discretion.      For prescription refill requests, have your pharmacy contact our office and allow 72 hours for refills to be completed.    Today you received 1 unit of blood   BELOW ARE SYMPTOMS THAT SHOULD BE REPORTED IMMEDIATELY: *FEVER GREATER THAN 100.4 F (38 C) OR HIGHER *CHILLS OR SWEATING *NAUSEA AND VOMITING THAT IS NOT CONTROLLED WITH YOUR NAUSEA MEDICATION *UNUSUAL SHORTNESS OF BREATH *UNUSUAL BRUISING OR BLEEDING *URINARY PROBLEMS (pain or burning when urinating, or frequent urination) *BOWEL PROBLEMS (unusual diarrhea, constipation, pain near the anus) TENDERNESS IN MOUTH AND THROAT WITH OR WITHOUT PRESENCE OF ULCERS (sore throat, sores in mouth, or a toothache) UNUSUAL RASH, SWELLING OR PAIN  UNUSUAL VAGINAL DISCHARGE OR ITCHING   Items with * indicate a potential emergency and should be followed up as soon as possible or go to the Emergency Department if any problems should occur.  Please show the CHEMOTHERAPY ALERT CARD or IMMUNOTHERAPY ALERT CARD at check-in to the Emergency Department and triage nurse.  Should you have questions after your visit or need to cancel or reschedule your appointment, please contact [facility] [phone]  and follow the prompts.  Office hours are 8:00 a.m. to 4:30 p.m. Monday - Friday. Please note that voicemails left after 4:00 p.m. may not be returned until the following business day.  We are closed weekends and major holidays. You have access to a nurse at all times for urgent questions. Please call the main number to the clinic [phone] and follow the prompts.  For any non-urgent questions, you may also contact your provider using MyChart. We now offer e-Visits for anyone 18 and older to request care online for non-urgent symptoms. For details visit [email or web address].   Also download the MyChart app! Go to the app store, search "MyChart", open the app, select [facility], and log in with your MyChart username and password.   

## 2022-05-09 NOTE — Progress Notes (Signed)
UNMATCHED BLOOD PRODUCT NOTE  Compare the patient ID on the blood tag to the patient ID on the hospital armband and Blood Bank armband. Then confirm the unit number on the blood tag matches the unit number on the blood product.  If a discrepancy is discovered return the product to blood bank immediately.   Blood Product Type: Packed Red Blood Cells  Blood Product Verified by 2 RN's: Acquanetta Chain, RN and Jan Fireman  Unit #: (Found on blood product bag, begins with W) [id number]  Product Code #: (Found on blood product bag, begins with E) [id number]   Start Time: 1035  Starting Rate: 120 ml/hr  Rate increase/decreased  (if applicable):    395  ml/hr  Rate changed time (if applicable): 8441   Stop Time: 1215   All Other Documentation should be documented within the Blood Admin Flowsheet per policy.

## 2022-05-12 LAB — TYPE AND SCREEN
ABO/RH(D): O POS
Antibody Screen: POSITIVE
DAT, IgG: POSITIVE
Unit division: 0
Unit division: 0

## 2022-05-12 LAB — BPAM RBC
Blood Product Expiration Date: 202401312359
Blood Product Expiration Date: 202401312359
ISSUE DATE / TIME: 202401041026
ISSUE DATE / TIME: 202401041026
Unit Type and Rh: 5100
Unit Type and Rh: 5100

## 2022-05-15 ENCOUNTER — Inpatient Hospital Stay: Payer: Medicare Other

## 2022-05-15 ENCOUNTER — Encounter: Payer: Self-pay | Admitting: Hematology

## 2022-05-15 ENCOUNTER — Inpatient Hospital Stay (HOSPITAL_BASED_OUTPATIENT_CLINIC_OR_DEPARTMENT_OTHER): Payer: Medicare Other | Admitting: Hematology

## 2022-05-15 VITALS — BP 159/63 | HR 81 | Temp 97.5°F | Resp 18

## 2022-05-15 DIAGNOSIS — Z79899 Other long term (current) drug therapy: Secondary | ICD-10-CM | POA: Diagnosis not present

## 2022-05-15 DIAGNOSIS — D649 Anemia, unspecified: Secondary | ICD-10-CM

## 2022-05-15 DIAGNOSIS — C9000 Multiple myeloma not having achieved remission: Secondary | ICD-10-CM

## 2022-05-15 DIAGNOSIS — D630 Anemia in neoplastic disease: Secondary | ICD-10-CM | POA: Diagnosis not present

## 2022-05-15 DIAGNOSIS — D696 Thrombocytopenia, unspecified: Secondary | ICD-10-CM

## 2022-05-15 DIAGNOSIS — Z5112 Encounter for antineoplastic immunotherapy: Secondary | ICD-10-CM | POA: Diagnosis not present

## 2022-05-15 LAB — CBC WITH DIFFERENTIAL/PLATELET
Abs Immature Granulocytes: 0.02 10*3/uL (ref 0.00–0.07)
Basophils Absolute: 0 10*3/uL (ref 0.0–0.1)
Basophils Relative: 0 %
Eosinophils Absolute: 0.1 10*3/uL (ref 0.0–0.5)
Eosinophils Relative: 2 %
HCT: 27.7 % — ABNORMAL LOW (ref 39.0–52.0)
Hemoglobin: 9 g/dL — ABNORMAL LOW (ref 13.0–17.0)
Immature Granulocytes: 1 %
Lymphocytes Relative: 18 %
Lymphs Abs: 0.5 10*3/uL — ABNORMAL LOW (ref 0.7–4.0)
MCH: 32.7 pg (ref 26.0–34.0)
MCHC: 32.5 g/dL (ref 30.0–36.0)
MCV: 100.7 fL — ABNORMAL HIGH (ref 80.0–100.0)
Monocytes Absolute: 0.6 10*3/uL (ref 0.1–1.0)
Monocytes Relative: 20 %
Neutro Abs: 1.8 10*3/uL (ref 1.7–7.7)
Neutrophils Relative %: 59 %
Platelets: 80 10*3/uL — ABNORMAL LOW (ref 150–400)
RBC: 2.75 MIL/uL — ABNORMAL LOW (ref 4.22–5.81)
WBC: 3 10*3/uL — ABNORMAL LOW (ref 4.0–10.5)
nRBC: 0 % (ref 0.0–0.2)

## 2022-05-15 LAB — COMPREHENSIVE METABOLIC PANEL
ALT: 29 U/L (ref 0–44)
AST: 19 U/L (ref 15–41)
Albumin: 2.7 g/dL — ABNORMAL LOW (ref 3.5–5.0)
Alkaline Phosphatase: 55 U/L (ref 38–126)
Anion gap: 5 (ref 5–15)
BUN: 16 mg/dL (ref 8–23)
CO2: 24 mmol/L (ref 22–32)
Calcium: 8.5 mg/dL — ABNORMAL LOW (ref 8.9–10.3)
Chloride: 102 mmol/L (ref 98–111)
Creatinine, Ser: 0.79 mg/dL (ref 0.61–1.24)
GFR, Estimated: >60 mL/min (ref >60)
Glucose, Bld: 106 mg/dL — ABNORMAL HIGH (ref 70–99)
Potassium: 3.5 mmol/L (ref 3.5–5.1)
Sodium: 131 mmol/L — ABNORMAL LOW (ref 135–145)
Total Bilirubin: 0.4 mg/dL (ref 0.3–1.2)
Total Protein: 8.4 g/dL — ABNORMAL HIGH (ref 6.5–8.1)

## 2022-05-15 LAB — MAGNESIUM: Magnesium: 1.9 mg/dL (ref 1.7–2.4)

## 2022-05-15 MED ORDER — ONDANSETRON HCL 4 MG/2ML IJ SOLN
8.0000 mg | Freq: Once | INTRAMUSCULAR | Status: AC
  Administered 2022-05-15: 8 mg via INTRAVENOUS
  Filled 2022-05-15: qty 4

## 2022-05-15 MED ORDER — SODIUM CHLORIDE 0.9 % IV SOLN: Freq: Once | INTRAVENOUS | Status: AC

## 2022-05-15 MED ORDER — METHYLPREDNISOLONE SODIUM SUCC 125 MG IJ SOLR
100.0000 mg | Freq: Once | INTRAMUSCULAR | Status: AC
  Administered 2022-05-15: 100 mg via INTRAVENOUS
  Filled 2022-05-15: qty 2

## 2022-05-15 MED ORDER — ACETAMINOPHEN 325 MG PO TABS
650.0000 mg | ORAL_TABLET | Freq: Once | ORAL | Status: AC
  Administered 2022-05-15: 650 mg via ORAL
  Filled 2022-05-15: qty 2

## 2022-05-15 MED ORDER — HEPARIN SOD (PORK) LOCK FLUSH 100 UNIT/ML IV SOLN
500.0000 [IU] | Freq: Once | INTRAVENOUS | Status: AC | PRN
  Administered 2022-05-15: 500 [IU]

## 2022-05-15 MED ORDER — CETIRIZINE HCL 10 MG/ML IV SOLN
10.0000 mg | Freq: Once | INTRAVENOUS | Status: AC
  Administered 2022-05-15: 10 mg via INTRAVENOUS
  Filled 2022-05-15: qty 1

## 2022-05-15 MED ORDER — SODIUM CHLORIDE 0.9% FLUSH
10.0000 mL | INTRAVENOUS | Status: DC | PRN
  Administered 2022-05-15: 10 mL

## 2022-05-15 MED ORDER — BORTEZOMIB CHEMO SQ INJECTION 3.5 MG (2.5MG/ML)
1.0000 mg/m2 | Freq: Once | INTRAMUSCULAR | Status: AC
  Administered 2022-05-15: 1.75 mg via SUBCUTANEOUS
  Filled 2022-05-15: qty 0.7

## 2022-05-15 MED ORDER — SODIUM CHLORIDE 0.9 % IV SOLN
16.0000 mg/kg | Freq: Once | INTRAVENOUS | Status: AC
  Administered 2022-05-15: 900 mg via INTRAVENOUS
  Filled 2022-05-15: qty 40

## 2022-05-15 NOTE — Progress Notes (Signed)
Patient has been examined by Dr. Katragadda, and vital signs and labs have been reviewed. ANC, Creatinine, LFTs, hemoglobin, and platelets are within treatment parameters per M.D. - pt may proceed with treatment.  Primary RN and pharmacy notified.  

## 2022-05-15 NOTE — Progress Notes (Signed)
Patient presents today for Daratumunab infusion and Velcade injection.  Patient is in satisfactory condition with no new complaints voiced. Vital signs are stable.  Labs reviewed by Dr. Delton Coombes during his office visit.  All labs are within treatment parameters.  We will proceed with treatment per MD orders.   Patient tolerated treatment well with no complaints voiced.  Patient left ambulatory in stable condition.  Vital signs stable at discharge.  Follow up as scheduled.

## 2022-05-15 NOTE — Patient Instructions (Signed)
[facility]  Discharge Instructions: Thank you for choosing [facility] to provide your oncology and hematology care.  If you have a lab appointment with the [facility], please come in thru the Main Entrance and check in at the main information desk.  Wear comfortable clothing and clothing appropriate for easy access to any Portacath or PICC line.   We strive to give you quality time with your provider. You may need to reschedule your appointment if you arrive late (15 or more minutes).  Arriving late affects you and other patients whose appointments are after yours.  Also, if you miss three or more appointments without notifying the office, you may be dismissed from the clinic at the provider's discretion.      For prescription refill requests, have your pharmacy contact our office and allow 72 hours for refills to be completed.    Today you received the following chemotherapy and/or immunotherapy agents Daratumumab/Velcade.  Daratumumab Injection What is this medication? DARATUMUMAB (dar a toom ue mab) treats multiple myeloma, a type of bone marrow cancer. It works by helping your immune system slow or stop the spread of cancer cells. It is a monoclonal antibody. This medicine may be used for other purposes; ask your health care provider or pharmacist if you have questions. COMMON BRAND NAME(S): DARZALEX What should I tell my care team before I take this medication? They need to know if you have any of these conditions: Hereditary fructose intolerance Infection, such as chickenpox, herpes, hepatitis B virus Lung or breathing disease, such as asthma, COPD An unusual or allergic reaction to daratumumab, sorbitol, other medications, foods, dyes, or preservatives Pregnant or trying to get pregnant Breast-feeding How should I use this medication? This medication is injected into a vein. It is given by your care team in a hospital or clinic setting. Talk  to your care team about the use of this medication in children. Special care may be needed. Overdosage: If you think you have taken too much of this medicine contact a poison control center or emergency room at once. NOTE: This medicine is only for you. Do not share this medicine with others. What if I miss a dose? Keep appointments for follow-up doses. It is important not to miss your dose. Call your care team if you are unable to keep an appointment. What may interact with this medication? Interactions have not been studied. This list may not describe all possible interactions. Give your health care provider a list of all the medicines, herbs, non-prescription drugs, or dietary supplements you use. Also tell them if you smoke, drink alcohol, or use illegal drugs. Some items may interact with your medicine. What should I watch for while using this medication? Your condition will be monitored carefully while you are receiving this medication. This medication can cause serious allergic reactions. To reduce your risk, your care team may give you other medication to take before receiving this one. Be sure to follow the directions from your care team. This medication can affect the results of blood tests to match your blood type. These changes can last for up to 6 months after the final dose. Your care team will do blood tests to match your blood type before you start treatment. Tell all of your care team that you are being treated with this medication before receiving a blood transfusion. This medication can affect the results of some tests used to determine treatment response; extra tests may be needed  to evaluate response. Talk to your care team if you wish to become pregnant or think you are pregnant. This medication can cause serious birth defects if taken during pregnancy and for 3 months after the last dose. A reliable form of contraception is recommended while taking this medication and for 3 months  after the last dose. Talk to your care team about effective forms of contraception. Do not breast-feed while taking this medication. What side effects may I notice from receiving this medication? Side effects that you should report to your care team as soon as possible: Allergic reactions--skin rash, itching, hives, swelling of the face, lips, tongue, or throat Infection--fever, chills, cough, sore throat, wounds that don't heal, pain or trouble when passing urine, general feeling of discomfort or being unwell Infusion reactions--chest pain, shortness of breath or trouble breathing, feeling faint or lightheaded Unusual bruising or bleeding Side effects that usually do not require medical attention (report to your care team if they continue or are bothersome): Constipation Diarrhea Fatigue Nausea Pain, tingling, or numbness in the hands or feet Swelling of the ankles, hands, or feet This list may not describe all possible side effects. Call your doctor for medical advice about side effects. You may report side effects to FDA at 1-800-FDA-1088. Where should I keep my medication? This medication is given in a hospital or clinic. It will not be stored at home. NOTE: This sheet is a summary. It may not cover all possible information. If you have questions about this medicine, talk to your doctor, pharmacist, or health care provider.  2023 Elsevier/Gold Standard (2021-08-15 00:00:00)    Bortezomib Injection What is this medication? BORTEZOMIB (bor TEZ oh mib) treats lymphoma. It may also be used to treat multiple myeloma, a type of bone marrow cancer. It works by blocking a protein that causes cancer cells to grow and multiply. This helps to slow or stop the spread of cancer cells. This medicine may be used for other purposes; ask your health care provider or pharmacist if you have questions. COMMON BRAND NAME(S): Velcade What should I tell my care team before I take this medication? They  need to know if you have any of these conditions: Dehydration Diabetes Heart disease Liver disease Tingling of the fingers or toes or other nerve disorder An unusual or allergic reaction to bortezomib, other medications, foods, dyes, or preservatives If you or your partner are pregnant or trying to get pregnant Breastfeeding How should I use this medication? This medication is injected into a vein or under the skin. It is given by your care team in a hospital or clinic setting. Talk to your care team about the use of this medication in children. Special care may be needed. Overdosage: If you think you have taken too much of this medicine contact a poison control center or emergency room at once. NOTE: This medicine is only for you. Do not share this medicine with others. What if I miss a dose? Keep appointments for follow-up doses. It is important not to miss your dose. Call your care team if you are unable to keep an appointment. What may interact with this medication? Ketoconazole Rifampin This list may not describe all possible interactions. Give your health care provider a list of all the medicines, herbs, non-prescription drugs, or dietary supplements you use. Also tell them if you smoke, drink alcohol, or use illegal drugs. Some items may interact with your medicine. What should I watch for while using this medication? Your condition  will be monitored carefully while you are receiving this medication. You may need blood work while taking this medication. This medication may affect your coordination, reaction time, or judgment. Do not drive or operate machinery until you know how this medication affects you. Sit up or stand slowly to reduce the risk of dizzy or fainting spells. Drinking alcohol with this medication can increase the risk of these side effects. This medication may increase your risk of getting an infection. Call your care team for advice if you get a fever, chills, sore  throat, or other symptoms of a cold or flu. Do not treat yourself. Try to avoid being around people who are sick. Check with your care team if you have severe diarrhea, nausea, and vomiting, or if you sweat a lot. The loss of too much body fluid may make it dangerous for you to take this medication. Talk to your care team if you may be pregnant. Serious birth defects can occur if you take this medication during pregnancy and for 7 months after the last dose. You will need a negative pregnancy test before starting this medication. Contraception is recommended while taking this medication and for 7 months after the last dose. Your care team can help you find the option that works for you. If your partner can get pregnant, use a condom during sex while taking this medication and for 4 months after the last dose. Do not breastfeed while taking this medication and for 2 months after the last dose. This medication may cause infertility. Talk to your care team if you are concerned about your fertility. What side effects may I notice from receiving this medication? Side effects that you should report to your care team as soon as possible: Allergic reactions--skin rash, itching, hives, swelling of the face, lips, tongue, or throat Bleeding--bloody or black, tar-like stools, vomiting blood or [place] Fe[place]ringer material that looks like coffee grounds, red or dark [place] urine, small red or purple spots on skin, unusual bruising or bleeding Bleeding in the brain--severe headache, stiff neck, confusion, dizziness, change in vision, numbness or weakness of the face, arm, or leg, trouble speaking, trouble walking, vomiting Bowel blockage--stomach cramping, unable to have a bowel movement or pass gas, loss of appetite, vomiting Heart failure--shortness of breath, swelling of the ankles, feet, or hands, sudden weight gain, unusual weakness or fatigue Infection--fever, chills, cough, sore throat, wounds that don't heal, pain or  trouble when passing urine, general feeling of discomfort or being unwell Liver injury--right upper belly pain, loss of appetite, nausea, light-colored stool, dark yellow or [place] urine, yellowing skin or eyes, unusual weakness or fatigue Low blood pressure--dizziness, feeling faint or lightheaded, blurry vision Lung injury--shortness of breath or trouble breathing, cough, spitting up blood, chest pain, fever Pain, tingling, or numbness in the hands or feet Severe or prolonged diarrhea Stomach pain, bloody diarrhea, pale skin, unusual weakness or fatigue, decrease in the amount of urine, which may be signs of hemolytic uremic syndrome Sudden and severe headache, confusion, change in vision, seizures, which may be signs of posterior reversible encephalopathy syndrome (PRES) TTP--purple spots on the skin or inside the mouth, pale skin, yellowing skin or eyes, unusual weakness or fatigue, fever, fast or irregular heartbeat, confusion, change in vision, trouble speaking, trouble walking Tumor lysis syndrome (TLS)--nausea, vomiting, diarrhea, decrease in the amount of urine, dark urine, unusual weakness or fatigue, confusion, muscle pain or cramps, fast or irregular heartbeat, joint pain Side effects that usually do not require medical attention (  report to your care team if they continue or are bothersome): Constipation Diarrhea Fatigue Loss of appetite Nausea This list may not describe all possible side effects. Call your doctor for medical advice about side effects. You may report side effects to FDA at 1-800-FDA-1088. Where should I keep my medication? This medication is given in a hospital or clinic. It will not be stored at home. NOTE: This sheet is a summary. It may not cover all possible information. If you have questions about this medicine, talk to your doctor, pharmacist, or health care provider.  2023 Elsevier/Gold Standard (2021-09-19 00:00:00)        To help prevent nausea and  vomiting after your treatment, we encourage you to take your nausea medication as directed.  BELOW ARE SYMPTOMS THAT SHOULD BE REPORTED IMMEDIATELY: *FEVER GREATER THAN 100.4 F (38 C) OR HIGHER *CHILLS OR SWEATING *NAUSEA AND VOMITING THAT IS NOT CONTROLLED WITH YOUR NAUSEA MEDICATION *UNUSUAL SHORTNESS OF BREATH *UNUSUAL BRUISING OR BLEEDING *URINARY PROBLEMS (pain or burning when urinating, or frequent urination) *BOWEL PROBLEMS (unusual diarrhea, constipation, pain near the anus) TENDERNESS IN MOUTH AND THROAT WITH OR WITHOUT PRESENCE OF ULCERS (sore throat, sores in mouth, or a toothache) UNUSUAL RASH, SWELLING OR PAIN  UNUSUAL VAGINAL DISCHARGE OR ITCHING   Items with * indicate a potential emergency and should be followed up as soon as possible or go to the Emergency Department if any problems should occur.  Please show the CHEMOTHERAPY ALERT CARD or IMMUNOTHERAPY ALERT CARD at check-in to the Emergency Department and triage nurse.  Should you have questions after your visit or need to cancel or reschedule your appointment, please contact [facility] [phone]  and follow the prompts.  Office hours are 8:00 a.m. to 4:30 p.m. Monday - Friday. Please note that voicemails left after 4:00 p.m. may not be returned until the following business day.  We are closed weekends and major holidays. You have access to a nurse at all times for urgent questions. Please call the main number to the clinic [phone] and follow the prompts.  For any non-urgent questions, you may also contact your provider using MyChart. We now offer e-Visits for anyone 55 and older to request care online for non-urgent symptoms. For details visit [email or web address].   Also download the MyChart app! Go to the app store, search "MyChart", open the app, select [facility], and log in with your MyChart username and password.

## 2022-05-15 NOTE — Progress Notes (Signed)
[address], [place] [address]   CLINIC:  Medical Oncology/Hematology  PCP:  No primary care provider on file. No primary physician on file. None   REASON FOR VISIT:  Follow-up for multiple myeloma and anemia  PRIOR THERAPY: none  CURRENT THERAPY: Weekly Velcade and dexamethasone  BRIEF ONCOLOGIC HISTORY:  Oncology History  Multiple myeloma ([place])  01/15/2021 Initial Diagnosis   Multiple myeloma ([place])   06/25/2021 - 12/06/2021 Chemotherapy   Patient is on Treatment Plan : MYELOMA NON-TRANSPLANT CANDIDATES VRd weekly q21d     12/20/2021 -  Chemotherapy   Patient is on Treatment Plan : MYELOMA Daratumumab IV q28d       CANCER STAGING:  Cancer Staging  No matching staging information was found for the patient.  INTERVAL HISTORY:  Dale [name], a 89 y.o. male, seen for follow-up of multiple myeloma and toxicity assessment prior to next treatments.  Dale [name] does not report any falls in the last 2 weeks.  Energy levels are 100%.  Reports some cramping in the hands.  Also had mild diarrhea.  REVIEW OF SYSTEMS:  Review of Systems  Gastrointestinal:  Positive for diarrhea.  Neurological:  Positive for numbness (Cramping in the hands).  All other systems reviewed and are negative.   PAST MEDICAL/SURGICAL HISTORY:  Past Medical History:  Diagnosis Date   Allergic rhinitis    Anal fissure    Anginal pain (HCC)    Asthma    Cancer (HCC)    skin   Chest pain    CHF (congestive heart failure) ([place])    Coronary heart disease    s/p stenting. cath in 01/2012 noncritical occlusion   Dyspnea    Dysrhythmia    1st degree heart block   GERD (gastroesophageal reflux disease)    Glaucoma    Hiatal hernia    Hyperlipidemia    Hypertension    Hypothyroidism    Idiopathic thrombocytopenic purpura ([place]) 2002   Macular degeneration    Nephrolithiasis    PUD (peptic ulcer disease)    remote   Sarcoidosis    pulmonary   Schatzki's ring     Past Surgical History:  Procedure Laterality Date   cardiac stents     COLONOSCOPY  10/30/2006   Normal rectum, sigmoid diverticula.Remainder of colonic mucosa appeared normal.   CORONARY ANGIOPLASTY WITH STENT PLACEMENT     about 10 years ago per pt (around 2007)   [place], URETEROSCOPY AND STENT PLACEMENT Left 06/16/2017   Procedure: CYSTOSCOPY WITH RETROGRADE PYELOGRAM, URETEROSCOPY,STONE EXTRACTION  AND STENT PLACEMENT;  Surgeon: Franchot [place]o, MD;  Location: [facility];  Service: Urology;  Laterality: Left;   ESOPHAGOGASTRODUODENOSCOPY  06/19/2004   Two esophageal rings and esophageal web as described above.  All of these were disrupted by passing 56-French Venia Minks dilator/ Candida esophagitis,which appears to be incidental given history of   antibiotic use, but nevertheless will be treated.   ESOPHAGOGASTRODUODENOSCOPY  10/30/2006   Distal tandem esophageal ring status post dilation disruption as  described above.  Otherwise normal esophagus/  Small hiatal hernia otherwise normal stomach, D1 and D2   ESOPHAGOGASTRODUODENOSCOPY N/A 03/22/2015   Dr.Rourk- noncritical schatzki's ring and hiatal hernia-o/w normal EGD.    ESOPHAGOGASTRODUODENOSCOPY (EGD) WITH ESOPHAGEAL DILATION  03/04/2012   RMR- schatzki's ring, hiatal hernia, polypoid gastric mucosa, bx= minimally active gastritis.   HOLMIUM LASER APPLICATION Left 6/56/8127   Procedure: HOLMIUM LASER APPLICATION;  Surgeon: Franchot [place]o, MD;  Location:  [facility];  Service: Urology;  Laterality: Left;   IR CV LINE INJECTION  12/27/2021   IR GENERIC HISTORICAL  03/06/2016   IR RADIOLOGIST EVAL & MGMT 03/06/2016 Aletta Edouard, MD [facility] INTERV RAD   IR GENERIC HISTORICAL  06/18/2016   IR RADIOLOGIST EVAL & MGMT 06/18/2016 Aletta Edouard, MD [facility] INTERV RAD   IR IMAGING GUIDED PORT INSERTION  12/18/2021   IR IMAGING GUIDED PORT INSERTION  01/15/2022   IR RADIOLOGIST EVAL & MGMT  10/01/2016   IR RADIOLOGIST  EVAL & MGMT  10/15/2017   IR RADIOLOGIST EVAL & MGMT  12/24/2018   IR RADIOLOGIST EVAL & MGMT  01/04/2020   IR RADIOLOGIST EVAL & MGMT  06/20/2021   IR REMOVAL TUN ACCESS W/ PORT W/O FL MOD SED  01/15/2022   LEFT HEART CATH N/A 02/02/2012   Procedure: LEFT HEART CATH;  Surgeon: Lorretta Harp, MD;  Location: [facility] CATH [facility];  Service: Cardiovascular;  Laterality: N/A;   MEDIASTINOSCOPY     for dx sarcoid   RADIOLOGY WITH ANESTHESIA Left 05/17/2016   Procedure: left renal ablation;  Surgeon: Aletta Edouard, MD;  Location: [facility];  Service: Radiology;  Laterality: Left;    SOCIAL HISTORY:  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    Employer: RETIRED  Tobacco Use   Smoking status: Former    Packs/day: 0.10    Years: 2.00    Total pack years: 0.20    Types: Cigarettes, Cigars    Quit date: 05/06/1970    Years since quitting: 52.0   Smokeless tobacco: Never  Vaping Use   Vaping Use: Never used  Substance and Sexual Activity   Alcohol use: No    Alcohol/week: 0.0 standard drinks of alcohol   Drug use: No   Sexual activity: Never  Other Topics Concern   Not on file  Social History Narrative   Not on file   Social Determinants of Health   Financial Resource Strain: Not on file  Food Insecurity: No Food Insecurity (02/06/2022)   Hunger Vital Sign    Worried About Running Out of Food in the Last Year: Never true    Ran Out of Food in the Last Year: Never true  Transportation Needs: No Transportation Needs (02/06/2022)   PRAPARE - Hydrologist (Medical): No    Lack of Transportation (Non-Medical): No  Physical Activity: Not on file  Stress: Not on file  Social Connections: Not on file  Intimate Partner Violence: Not At  Risk (02/06/2022)   Humiliation, Afraid, Rape, and Kick questionnaire    Fear of Current or Ex-Partner: No    Emotionally Abused: No    Physically Abused: No    Sexually Abused: No    FAMILY HISTORY:  Family History  Problem Relation Age of Onset   Heart disease Father        deceased age 45   Stroke Mother    Alzheimer's disease Mother    Heart attack Brother        deceased age 63   Cancer Other        niece   Colon cancer Neg Hx     CURRENT MEDICATIONS:  Current  Outpatient Medications  Medication Sig Dispense Refill   acetaminophen (TYLENOL) 325 MG tablet Take 2 tablets (650 mg total) by mouth every 6 (six) hours as needed for mild pain (or Fever >/= 101).     acyclovir (ZOVIRAX) 400 MG tablet TAKE 1 TABLET BY MOUTH TWICE  DAILY 180 tablet 0   Ascorbic Acid (VITAMIN C PO) Take 500 mg by mouth every evening.     atorvastatin (LIPITOR) 40 MG tablet TAKE 1 TABLET BY MOUTH IN  THE EVENING (Patient taking differently: Take 40 mg by mouth daily.) 90 tablet 3   Bortezomib (VELCADE IJ) Inject as directed once a week.     brimonidine (ALPHAGAN) 0.2 % ophthalmic solution Place 1 drop into the left eye 2 (two) times daily.     cyanocobalamin 1000 MCG tablet Take 1 tablet (1,000 mcg total) by mouth daily. 30 tablet 3   dexamethasone (DECADRON) 2 MG tablet TAKE 5 TABLETS BY MOUTH ONCE  WEEKLY 60 tablet 0   dorzolamide-timolol (COSOPT) 22.3-6.8 MG/ML ophthalmic solution Place 1 drop into the left eye 2 (two) times daily.     feeding supplement (ENSURE ENLIVE / ENSURE PLUS) LIQD Take 237 mLs by mouth 3 (three) times daily between meals.     isosorbide mononitrate (IMDUR) 60 MG 24 hr tablet Take 1 tablet (60 mg total) by mouth daily. TAKE 1 AND 1/2 TABLETS BY  MOUTH IN THE MORNING AND  1/2 TABLET AT NIGHT     levothyroxine (SYNTHROID) 75 MCG tablet Take 75 mcg by mouth daily before breakfast.     lidocaine-prilocaine (EMLA) cream Apply 1 Application topically as needed. 30 g 1   losartan  (COZAAR) 100 MG tablet TAKE 1 TABLET BY MOUTH DAILY 90 tablet 3   metoprolol tartrate (LOPRESSOR) 25 MG tablet TAKE 1 TABLET BY MOUTH IN  THE MORNING AND ONE-HALF  TABLET BY MOUTH IN THE  EVENING (Patient taking differently: Take 12.5-25 mg by mouth in the morning and at bedtime. TAKE 1 TABLET BY MOUTH IN  THE MORNING AND ONE-HALF  TABLET BY MOUTH IN THE  EVENING) 135 tablet 3   Multiple Vitamins-Minerals (PRESERVISION/LUTEIN) CAPS Take 1 capsule by mouth 2 (two) times daily.     nitroGLYCERIN (NITROSTAT) 0.4 MG SL tablet Place 1 tablet (0.4 mg total) under the tongue every 5 (five) minutes as needed. For chest pain 25 tablet 2   pantoprazole (PROTONIX) 40 MG tablet TAKE 1 TABLET BY MOUTH DAILY  BEFORE BREAKFAST 90 tablet 3   potassium chloride SA (KLOR-CON M) 20 MEQ tablet TAKE 1 TABLET BY MOUTH  DAILY (Patient taking differently: Take 20 mEq by mouth daily.) 90 tablet 3   prochlorperazine (COMPAZINE) 10 MG tablet Take 1 tablet (10 mg total) by mouth every 6 (six) hours as needed for nausea or vomiting. 30 tablet 3   ROCKLATAN 0.02-0.005 % SOLN Place 1 drop into the left eye at bedtime.     triamcinolone (KENALOG) 0.1 % cream Apply 1 application. topically 2 (two) times daily as needed (for irritation).     vitamin E 200 UNIT capsule Take 200 Units by mouth every evening.     No current facility-administered medications for this visit.   Facility-Administered Medications Ordered in Other Visits  Medication Dose Route Frequency Provider Last Rate Last Admin   acetaminophen (TYLENOL) 325 MG tablet            diphenhydrAMINE (BENADRYL) 25 mg capsule            montelukast (  SINGULAIR) 10 MG tablet             ALLERGIES:  Allergies  Allergen Reactions   Azithromycin Other (See Comments)    Sore mouth and fever blisters around mouth, sores in nose area as well   Doxazosin Shortness Of Breath   Atenolol Other (See Comments)    UNKNOWN REACTION   Hydrocodone Nausea And Vomiting   Levofloxacin  Other (See Comments)    Caused stomach problems.   Morphine Other (See Comments)    "made me crazy"   Penicillins Nausea And Vomiting and Other (See Comments)    Has patient had a PCN reaction causing immediate rash, facial/tongue/throat swelling, SOB or lightheadedness with hypotension: No Has patient had a PCN reaction causing severe rash involving mucus membranes or skin necrosis: No Has patient had a PCN reaction that required hospitalization No Has patient had a PCN reaction occurring within the last 10 years: No If all of the above answers are "NO", then may proceed with Cephalosporin use.    Sulfonamide Derivatives Nausea And Vomiting    PHYSICAL EXAM:  Performance status (ECOG): 0 - Asymptomatic  There were no vitals filed for this visit.  Wt Readings from Last 3 Encounters:  05/15/22 123 lb 10.9 oz (56.1 kg)  05/08/22 129 lb 6.4 oz (58.7 kg)  04/30/22 126 lb 12.8 oz (57.5 kg)   Physical Exam Vitals reviewed.  Constitutional:      Appearance: Normal appearance.  Cardiovascular:     Rate and Rhythm: Normal rate and regular rhythm.     Pulses: Normal pulses.     Heart sounds: Normal heart sounds.  Pulmonary:     Effort: Pulmonary effort is normal.     Breath sounds: Normal breath sounds.  Neurological:     General: No focal deficit present.     Mental Status: Dale [name] is alert and oriented to person, place, and time.  Psychiatric:        Mood and Affect: Mood normal.        Behavior: Behavior normal.     LABORATORY DATA:  I have reviewed the labs as listed.     Latest Ref Rng & Units 05/15/2022    7:40 AM 05/08/2022    8:05 AM 04/30/2022    7:58 AM  CBC  WBC 4.0 - 10.5 K/uL 3.0  4.5  2.6   Hemoglobin 13.0 - 17.0 g/dL 9.0  7.6  8.5   Hematocrit 39.0 - 52.0 % 27.7  22.9  25.3   Platelets 150 - 400 K/uL 80  69  26       Latest Ref Rng & Units 05/15/2022    7:40 AM 05/08/2022    8:05 AM 04/30/2022    7:58 AM  CMP  Glucose 70 - 99 mg/dL 106  92  96   BUN 8 - 23  mg/dL '16  21  23   '$ Creatinine 0.61 - 1.24 mg/dL 0.79  0.68  0.77   Sodium 135 - 145 mmol/L 131  131  133   Potassium 3.5 - 5.1 mmol/L 3.5  3.8  3.4   Chloride 98 - 111 mmol/L 102  103  108   CO2 22 - 32 mmol/L '24  23  23   '$ Calcium 8.9 - 10.3 mg/dL 8.5  8.2  8.4   Total Protein 6.5 - 8.1 g/dL 8.4  8.3  8.5   Total Bilirubin 0.3 - 1.2 mg/dL 0.4  0.6  0.6   Alkaline Phos 38 -  126 U/L 55  52  51   AST 15 - 41 U/L '19  25  31   '$ ALT 0 - 44 U/L 29  43  39     DIAGNOSTIC IMAGING:  I have independently reviewed the scans and discussed with the patient. No results found.   ASSESSMENT:  1.  IgG lambda plasma cell myeloma: - Work-up for macrocytic anemia on 12/01/2020 showed M spike of 2.9 g.  Immunofixation IgG lambda. - Lambda light chains elevated at 284.  Light chain ratio was 0.04.  LDH was 163.  Creatinine was 0.9 and calcium 8.9. - Bone marrow biopsy on 12/18/2020-hypercellular marrow for age with 48% atypical plasma cells with lambda light chain restriction. - Chromosome analysis was normal. 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 - Revlimid 5 mg, 2 weeks on/1 week off started on 01/20/2021.  Dexamethasone weekly 10 mg added on 02/07/2021.  Revlimid is discontinued around 06/15/2021 due to poor tolerance and ineffectiveness at low-dose.  Thrombocytopenia and anemia. - Velcade weekly on days 1, 8, 15 every 21 days along with dexamethasone 10 mg weekly started on 06/25/2021.  2.  Macrocytic anemia: - CBC on 12/01/2020 with hemoglobin 8.5 and MCV of 117. - Denies any bleeding per rectum or melena.  3.  Social/family history: - Lives at home with his wife.  Dale [name] does all ADLs and IADLs.  Dale [name] even does yard work. - Dale [name] worked at [employer] for 35 years.  Denies any chemical exposure.  Non-smoker. - No family history of malignancies.   PLAN:  1.  IgG lambda plasma cell myeloma, high risk: - Myeloma labs on  04/23/2022 with M spike 2.8, down from 3.7 g previously. - I have reviewed his labs today which showed normal creatinine and LFTs.  CBC with hemoglobin 9.0 and platelet count 80.  White count is adequate at for treatment. - Proceed with cycle 3-day 15 Velcade and Darzalex today.  Dale [name] will be off next week. - Dale [name] will start cycle 4-day 1 on 05/27/2022.  RTC cycle 4-day 8 with repeat myeloma labs.   2.  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 - Transfuse to keep hemoglobin above 8.  Continue weekly CBC.  3.  Thromboprophylaxis: - Continue aspirin 81 mg daily.  4.  Right ankle swelling: - Continue Lasix as needed.  5.  ID prophylaxis: - Continue acyclovir twice daily.    Orders placed this encounter:  No orders of the defined types were placed in this encounter.       Derek Jack, MD [place] [phone]

## 2022-05-15 NOTE — Patient Instructions (Addendum)
[facility] Discharge Instructions   You were seen and examined today by Dr. Katragadda.  He reviewed the results of your lab work which are normal/stable.   We will proceed with your treatment today.  Return as scheduled.    Thank you for choosing  Can[facility] Center at [facility] to provide your oncology and hematology care.  To afford each patient quality time with our provider, please arrive at least 15 minutes before your scheduled appointment time.   If you have a lab appointment with the Cancer Center please come in thru the Main Entrance and check in at the main information desk.  You need to re-schedule your appointment should you arrive 10 or more minutes late.  We strive to give you quality time with our providers, and arriving late affects you and other patients whose appointments are after yours.  Also, if you no show three or more times for appointments you may be dismissed from the clinic at the providers discretion.     Again, thank you for choosing [facility].  Our hope is that these requests will decrease the amount of time that you wait before being seen by our physicians.       _____________________________________________________________  Should you have questions after your visit to [facility], please contact our office at [phone] and follow the prompts.  Our office hours are 8:00 a.m. and 4:30 p.m. Monday - Friday.  Please note that voicemails left after 4:00 p.m. may not be returned until the following business day.  We are closed weekends and major holidays.  You do have access to a nurse 24-7, just call the main number to the clinic [phone] and do not press any options, hold on the line and a nurse will answer the phone.    For prescription refill requests, have your pharmacy contact our office and allow 72 hours.    Due to Covid, you will need to wear a mask upon entering  the hospital. If you do not have a mask, a mask will be given to you at the Main Entrance upon arrival. For doctor visits, patients may have 1 support person age 18 or older with them. For treatment visits, patients can not have anyone with them due to social distancing guidelines and our immunocompromised population.      

## 2022-05-16 ENCOUNTER — Other Ambulatory Visit: Payer: Self-pay | Admitting: Cardiovascular Disease

## 2022-05-16 LAB — KAPPA/LAMBDA LIGHT CHAINS
Kappa free light chain: 3.1 mg/L — ABNORMAL LOW (ref 3.3–19.4)
Kappa, lambda light chain ratio: 0.01 — ABNORMAL LOW (ref 0.26–1.65)
Lambda free light chains: 479.2 mg/L — ABNORMAL HIGH (ref 5.7–26.3)

## 2022-05-17 LAB — PROTEIN ELECTROPHORESIS, SERUM
A/G Ratio: 0.6 — ABNORMAL LOW (ref 0.7–1.7)
Albumin ELP: 3.3 g/dL (ref 2.9–4.4)
Alpha-1-Globulin: 0.2 g/dL (ref 0.0–0.4)
Alpha-2-Globulin: 0.7 g/dL (ref 0.4–1.0)
Beta Globulin: 0.7 g/dL (ref 0.7–1.3)
Gamma Globulin: 3.5 g/dL — ABNORMAL HIGH (ref 0.4–1.8)
Globulin, Total: 5.1 g/dL — ABNORMAL HIGH (ref 2.2–3.9)
M-Spike, %: 3.3 g/dL — ABNORMAL HIGH
Total Protein ELP: 8.4 g/dL (ref 6.0–8.5)

## 2022-05-20 ENCOUNTER — Other Ambulatory Visit (HOSPITAL_COMMUNITY): Payer: Self-pay | Admitting: Hematology

## 2022-05-20 DIAGNOSIS — C9000 Multiple myeloma not having achieved remission: Secondary | ICD-10-CM

## 2022-05-22 ENCOUNTER — Inpatient Hospital Stay: Payer: Medicare Other

## 2022-05-22 VITALS — BP 155/64 | HR 77 | Temp 97.9°F | Resp 18

## 2022-05-22 DIAGNOSIS — C9000 Multiple myeloma not having achieved remission: Secondary | ICD-10-CM

## 2022-05-22 DIAGNOSIS — Z79899 Other long term (current) drug therapy: Secondary | ICD-10-CM | POA: Diagnosis not present

## 2022-05-22 DIAGNOSIS — D630 Anemia in neoplastic disease: Secondary | ICD-10-CM | POA: Diagnosis not present

## 2022-05-22 DIAGNOSIS — Z5112 Encounter for antineoplastic immunotherapy: Secondary | ICD-10-CM | POA: Diagnosis not present

## 2022-05-22 DIAGNOSIS — Z95828 Presence of other vascular implants and grafts: Secondary | ICD-10-CM

## 2022-05-22 DIAGNOSIS — D649 Anemia, unspecified: Secondary | ICD-10-CM

## 2022-05-22 LAB — CBC WITH DIFFERENTIAL/PLATELET
Abs Immature Granulocytes: 0.01 10*3/uL (ref 0.00–0.07)
Basophils Absolute: 0 10*3/uL (ref 0.0–0.1)
Basophils Relative: 0 %
Eosinophils Absolute: 0.1 10*3/uL (ref 0.0–0.5)
Eosinophils Relative: 2 %
HCT: 25.6 % — ABNORMAL LOW (ref 39.0–52.0)
Hemoglobin: 8.3 g/dL — ABNORMAL LOW (ref 13.0–17.0)
Immature Granulocytes: 0 %
Lymphocytes Relative: 13 %
Lymphs Abs: 0.5 10*3/uL — ABNORMAL LOW (ref 0.7–4.0)
MCH: 33.5 pg (ref 26.0–34.0)
MCHC: 32.4 g/dL (ref 30.0–36.0)
MCV: 103.2 fL — ABNORMAL HIGH (ref 80.0–100.0)
Monocytes Absolute: 0.7 10*3/uL (ref 0.1–1.0)
Monocytes Relative: 20 %
Neutro Abs: 2.3 10*3/uL (ref 1.7–7.7)
Neutrophils Relative %: 65 %
Platelets: 97 10*3/uL — ABNORMAL LOW (ref 150–400)
RBC: 2.48 MIL/uL — ABNORMAL LOW (ref 4.22–5.81)
WBC: 3.6 10*3/uL — ABNORMAL LOW (ref 4.0–10.5)
nRBC: 0 % (ref 0.0–0.2)

## 2022-05-22 LAB — SAMPLE TO BLOOD BANK

## 2022-05-22 MED ORDER — SODIUM CHLORIDE 0.9% FLUSH
10.0000 mL | INTRAVENOUS | Status: DC | PRN
  Administered 2022-05-22: 10 mL via INTRAVENOUS

## 2022-05-22 NOTE — Progress Notes (Signed)
Patients port flushed without difficulty.  Good blood return noted with no bruising or swelling noted at site.  Band aid applied.  VSS with discharge and left in satisfactory condition with no s/s of distress noted.   

## 2022-05-29 ENCOUNTER — Inpatient Hospital Stay: Payer: Medicare Other

## 2022-05-29 VITALS — BP 188/92 | HR 67 | Temp 97.1°F | Resp 16 | Wt 125.6 lb

## 2022-05-29 DIAGNOSIS — D630 Anemia in neoplastic disease: Secondary | ICD-10-CM | POA: Diagnosis not present

## 2022-05-29 DIAGNOSIS — C9000 Multiple myeloma not having achieved remission: Secondary | ICD-10-CM

## 2022-05-29 DIAGNOSIS — Z5112 Encounter for antineoplastic immunotherapy: Secondary | ICD-10-CM | POA: Diagnosis not present

## 2022-05-29 DIAGNOSIS — Z79899 Other long term (current) drug therapy: Secondary | ICD-10-CM | POA: Diagnosis not present

## 2022-05-29 LAB — CBC WITH DIFFERENTIAL/PLATELET
Abs Immature Granulocytes: 0.03 10*3/uL (ref 0.00–0.07)
Basophils Absolute: 0 10*3/uL (ref 0.0–0.1)
Basophils Relative: 0 %
Eosinophils Absolute: 0.1 10*3/uL (ref 0.0–0.5)
Eosinophils Relative: 2 %
HCT: 24 % — ABNORMAL LOW (ref 39.0–52.0)
Hemoglobin: 7.7 g/dL — ABNORMAL LOW (ref 13.0–17.0)
Immature Granulocytes: 1 %
Lymphocytes Relative: 10 %
Lymphs Abs: 0.5 10*3/uL — ABNORMAL LOW (ref 0.7–4.0)
MCH: 32.9 pg (ref 26.0–34.0)
MCHC: 32.1 g/dL (ref 30.0–36.0)
MCV: 102.6 fL — ABNORMAL HIGH (ref 80.0–100.0)
Monocytes Absolute: 0.6 10*3/uL (ref 0.1–1.0)
Monocytes Relative: 12 %
Neutro Abs: 3.7 10*3/uL (ref 1.7–7.7)
Neutrophils Relative %: 75 %
Platelets: 88 10*3/uL — ABNORMAL LOW (ref 150–400)
RBC: 2.34 MIL/uL — ABNORMAL LOW (ref 4.22–5.81)
WBC: 4.9 10*3/uL (ref 4.0–10.5)
nRBC: 0 % (ref 0.0–0.2)

## 2022-05-29 LAB — COMPREHENSIVE METABOLIC PANEL
ALT: 27 U/L (ref 0–44)
AST: 22 U/L (ref 15–41)
Albumin: 2.6 g/dL — ABNORMAL LOW (ref 3.5–5.0)
Alkaline Phosphatase: 57 U/L (ref 38–126)
Anion gap: 6 (ref 5–15)
BUN: 19 mg/dL (ref 8–23)
CO2: 22 mmol/L (ref 22–32)
Calcium: 8.2 mg/dL — ABNORMAL LOW (ref 8.9–10.3)
Chloride: 104 mmol/L (ref 98–111)
Creatinine, Ser: 0.87 mg/dL (ref 0.61–1.24)
GFR, Estimated: >60 mL/min (ref >60)
Glucose, Bld: 91 mg/dL (ref 70–99)
Potassium: 3.5 mmol/L (ref 3.5–5.1)
Sodium: 132 mmol/L — ABNORMAL LOW (ref 135–145)
Total Bilirubin: 0.6 mg/dL (ref 0.3–1.2)
Total Protein: 8.2 g/dL — ABNORMAL HIGH (ref 6.5–8.1)

## 2022-05-29 LAB — PREPARE RBC (CROSSMATCH)

## 2022-05-29 LAB — SAMPLE TO BLOOD BANK

## 2022-05-29 MED ORDER — SODIUM CHLORIDE 0.9 % IV SOLN
16.0000 mg/kg | Freq: Once | INTRAVENOUS | Status: AC
  Administered 2022-05-29: 900 mg via INTRAVENOUS
  Filled 2022-05-29: qty 40

## 2022-05-29 MED ORDER — METHYLPREDNISOLONE SODIUM SUCC 125 MG IJ SOLR
100.0000 mg | Freq: Once | INTRAMUSCULAR | Status: AC
  Administered 2022-05-29: 100 mg via INTRAVENOUS
  Filled 2022-05-29: qty 2

## 2022-05-29 MED ORDER — SODIUM CHLORIDE 0.9% FLUSH
10.0000 mL | INTRAVENOUS | Status: DC | PRN
  Administered 2022-05-29: 10 mL

## 2022-05-29 MED ORDER — BORTEZOMIB CHEMO SQ INJECTION 3.5 MG (2.5MG/ML)
1.0000 mg/m2 | Freq: Once | INTRAMUSCULAR | Status: AC
  Administered 2022-05-29: 1.75 mg via SUBCUTANEOUS
  Filled 2022-05-29: qty 0.7

## 2022-05-29 MED ORDER — CETIRIZINE HCL 10 MG/ML IV SOLN
10.0000 mg | Freq: Once | INTRAVENOUS | Status: AC
  Administered 2022-05-29: 10 mg via INTRAVENOUS
  Filled 2022-05-29: qty 1

## 2022-05-29 MED ORDER — HEPARIN SOD (PORK) LOCK FLUSH 100 UNIT/ML IV SOLN
500.0000 [IU] | Freq: Once | INTRAVENOUS | Status: AC | PRN
  Administered 2022-05-29: 500 [IU]

## 2022-05-29 MED ORDER — SODIUM CHLORIDE 0.9 % IV SOLN: Freq: Once | INTRAVENOUS | Status: AC

## 2022-05-29 MED ORDER — ACETAMINOPHEN 325 MG PO TABS
650.0000 mg | ORAL_TABLET | Freq: Once | ORAL | Status: AC
  Administered 2022-05-29: 650 mg via ORAL
  Filled 2022-05-29: qty 2

## 2022-05-29 MED ORDER — ONDANSETRON HCL 4 MG/2ML IJ SOLN
8.0000 mg | Freq: Once | INTRAMUSCULAR | Status: AC
  Administered 2022-05-29: 8 mg via INTRAVENOUS
  Filled 2022-05-29: qty 4

## 2022-05-29 NOTE — Progress Notes (Signed)
Pt presents today for Daratumumab IV and Velcade per provider's order. Vital signs and other labs WNL for treatment. Pt's hemoglobin is 7.7 and platelets are 88 today. Dr.K made aware and stated to proceed with treatment today and give 1 unit of blood tomorrow per Dr.K.  Treatment given today per MD orders. Tolerated infusion without adverse affects. Vital signs stable. No complaints at this time. Discharged from clinic ambulatory in stable condition. Alert and oriented x 3. F/U with [facility] as scheduled.

## 2022-05-29 NOTE — Progress Notes (Signed)
Message received from A. Ouida Sills RN / Dr. Delton Coombes may discharge patient with blood pressure of 188/92. Patient instructed to take blood pressure medication as soon as he arrives home. Understanding verbalized.

## 2022-05-29 NOTE — Patient Instructions (Signed)
[facility]  Discharge Instructions: Thank you for choosing [facility] to provide your oncology and hematology care.  If you have a lab appointment with the [facility], please come in thru the Main Entrance and check in at the main information desk.  Wear comfortable clothing and clothing appropriate for easy access to any Portacath or PICC line.   We strive to give you quality time with your provider. You may need to reschedule your appointment if you arrive late (15 or more minutes).  Arriving late affects you and other patients whose appointments are after yours.  Also, if you miss three or more appointments without notifying the office, you may be dismissed from the clinic at the provider's discretion.      For prescription refill requests, have your pharmacy contact our office and allow 72 hours for refills to be completed.    Today you received the following chemotherapy and/or immunotherapy agents Daratumumab IV and Velcade   To help prevent nausea and vomiting after your treatment, we encourage you to take your nausea medication as directed.  Daratumumab; Hyaluronidase Injection What is this medication? DARATUMUMAB; HYALURONIDASE (dar a toom ue mab; hye al ur ON i dase) treats multiple myeloma, a type of bone marrow cancer. Daratumumab works by blocking a protein that causes cancer cells to grow and multiply. This helps to slow or stop the spread of cancer cells. Hyaluronidase works by increasing the absorption of other medications in the body to help them work better. This medication may also be used treat amyloidosis, a condition that causes the buildup of a protein (amyloid) in your body. It works by reducing the buildup of this protein, which decreases symptoms. It is a combination medication that contains a monoclonal antibody. This medicine may be used for other purposes; ask your health care provider or pharmacist if you have questions. COMMON  BRAND NAME(S): DARZALEX FASPRO What should I tell my care team before I take this medication? They need to know if you have any of these conditions: Heart disease Infection, such as chickenpox, cold sores, herpes, hepatitis B Lung or breathing disease An unusual or allergic reaction to daratumumab, hyaluronidase, other medications, foods, dyes, or preservatives Pregnant or trying to get pregnant Breast-feeding How should I use this medication? This medication is injected under the skin. It is given by your care team in a hospital or clinic setting. Talk to your care team about the use of this medication in children. Special care may be needed. Overdosage: If you think you have taken too much of this medicine contact a poison control center or emergency room at once. NOTE: This medicine is only for you. Do not share this medicine with others. What if I miss a dose? Keep appointments for follow-up doses. It is important not to miss your dose. Call your care team if you are unable to keep an appointment. What may interact with this medication? Interactions have not been studied. This list may not describe all possible interactions. Give your health care provider a list of all the medicines, herbs, non-prescription drugs, or dietary supplements you use. Also tell them if you smoke, drink alcohol, or use illegal drugs. Some items may interact with your medicine. What should I watch for while using this medication? Your condition will be monitored carefully while you are receiving this medication. This medication can cause serious allergic reactions. To reduce your risk, your care team may give you other medication to take before  receiving this one. Be sure to follow the directions from your care team. This medication can affect the results of blood tests to match your blood type. These changes can last for up to 6 months after the final dose. Your care team will do blood tests to match your blood  type before you start treatment. Tell all of your care team that you are being treated with this medication before receiving a blood transfusion. This medication can affect the results of some tests used to determine treatment response; extra tests may be needed to evaluate response. Talk to your care team if you wish to become pregnant or think you are pregnant. This medication can cause serious birth defects if taken during pregnancy and for 3 months after the last dose. A reliable form of contraception is recommended while taking this medication and for 3 months after the last dose. Talk to your care team about effective forms of contraception. Do not breast-feed while taking this medication. What side effects may I notice from receiving this medication? Side effects that you should report to your care team as soon as possible: Allergic reactions--skin rash, itching, hives, swelling of the face, lips, tongue, or throat Heart rhythm changes--fast or irregular heartbeat, dizziness, feeling faint or lightheaded, chest pain, trouble breathing Infection--fever, chills, cough, sore throat, wounds that don't heal, pain or trouble when passing urine, general feeling of discomfort or being unwell Infusion reactions--chest pain, shortness of breath or trouble breathing, feeling faint or lightheaded Sudden eye pain or change in vision such as blurry vision, seeing halos around lights, vision loss Unusual bruising or bleeding Side effects that usually do not require medical attention (report to your care team if they continue or are bothersome): Constipation Diarrhea Fatigue Nausea Pain, tingling, or numbness in the hands or feet Swelling of the ankles, hands, or feet This list may not describe all possible side effects. Call your doctor for medical advice about side effects. You may report side effects to FDA at 1-800-FDA-1088. Where should I keep my medication? This medication is given in a hospital or  clinic. It will not be stored at home. NOTE: This sheet is a summary. It may not cover all possible information. If you have questions about this medicine, talk to your doctor, pharmacist, or health care provider.  2023 Elsevier/Gold Standard (2021-08-15 00:00:00)  Bortezomib Injection What is this medication? BORTEZOMIB (bor TEZ oh mib) treats lymphoma. It may also be used to treat multiple myeloma, a type of bone marrow cancer. It works by blocking a protein that causes cancer cells to grow and multiply. This helps to slow or stop the spread of cancer cells. This medicine may be used for other purposes; ask your health care provider or pharmacist if you have questions. COMMON BRAND NAME(S): Velcade What should I tell my care team before I take this medication? They need to know if you have any of these conditions: Dehydration Diabetes Heart disease Liver disease Tingling of the fingers or toes or other nerve disorder An unusual or allergic reaction to bortezomib, other medications, foods, dyes, or preservatives If you or your partner are pregnant or trying to get pregnant Breastfeeding How should I use this medication? This medication is injected into a vein or under the skin. It is given by your care team in a hospital or clinic setting. Talk to your care team about the use of this medication in children. Special care may be needed. Overdosage: If you think you have taken too  much of this medicine contact a poison control center or emergency room at once. NOTE: This medicine is only for you. Do not share this medicine with others. What if I miss a dose? Keep appointments for follow-up doses. It is important not to miss your dose. Call your care team if you are unable to keep an appointment. What may interact with this medication? Ketoconazole Rifampin This list may not describe all possible interactions. Give your health care provider a list of all the medicines, herbs,  non-prescription drugs, or dietary supplements you use. Also tell them if you smoke, drink alcohol, or use illegal drugs. Some items may interact with your medicine. What should I watch for while using this medication? Your condition will be monitored carefully while you are receiving this medication. You may need blood work while taking this medication. This medication may affect your coordination, reaction time, or judgment. Do not drive or operate machinery until you know how this medication affects you. Sit up or stand slowly to reduce the risk of dizzy or fainting spells. Drinking alcohol with this medication can increase the risk of these side effects. This medication may increase your risk of getting an infection. Call your care team for advice if you get a fever, chills, sore throat, or other symptoms of a cold or flu. Do not treat yourself. Try to avoid being around people who are sick. Check with your care team if you have severe diarrhea, nausea, and vomiting, or if you sweat a lot. The loss of too much body fluid may make it dangerous for you to take this medication. Talk to your care team if you may be pregnant. Serious birth defects can occur if you take this medication during pregnancy and for 7 months after the last dose. You will need a negative pregnancy test before starting this medication. Contraception is recommended while taking this medication and for 7 months after the last dose. Your care team can help you find the option that works for you. If your partner can get pregnant, use a condom during sex while taking this medication and for 4 months after the last dose. Do not breastfeed while taking this medication and for 2 months after the last dose. This medication may cause infertility. Talk to your care team if you are concerned about your fertility. What side effects may I notice from receiving this medication? Side effects that you should report to your care team as soon as  possible: Allergic reactions--skin rash, itching, hives, swelling of the face, lips, tongue, or throat Bleeding--bloody or black, tar-like stools, vomiting blood or brown material that looks like coffee grounds, red or dark brown urine, small red or purple spots on skin, unusual bruising or bleeding Bleeding in the brain--severe headache, stiff neck, confusion, dizziness, change in vision, numbness or weakness of the face, arm, or leg, trouble speaking, trouble walking, vomiting Bowel blockage--stomach cramping, unable to have a bowel movement or pass gas, loss of appetite, vomiting Heart failure--shortness of breath, swelling of the ankles, feet, or hands, sudden weight gain, unusual weakness or fatigue Infection--fever, chills, cough, sore throat, wounds that don't heal, pain or trouble when passing urine, general feeling of discomfort or being unwell Liver injury--right upper belly pain, loss of appetite, nausea, light-colored stool, dark yellow or brown urine, yellowing skin or eyes, unusual weakness or fatigue Low blood pressure--dizziness, feeling faint or lightheaded, blurry vision Lung injury--shortness of breath or trouble breathing, cough, spitting up blood, chest pain, fever Pain, tingling,  or numbness in the hands or feet Severe or prolonged diarrhea Stomach pain, bloody diarrhea, pale skin, unusual weakness or fatigue, decrease in the amount of urine, which may be signs of hemolytic uremic syndrome Sudden and severe headache, confusion, change in vision, seizures, which may be signs of posterior reversible encephalopathy syndrome (PRES) TTP--purple spots on the skin or inside the mouth, pale skin, yellowing skin or eyes, unusual weakness or fatigue, fever, fast or irregular heartbeat, confusion, change in vision, trouble speaking, trouble walking Tumor lysis syndrome (TLS)--nausea, vomiting, diarrhea, decrease in the amount of urine, dark urine, unusual weakness or fatigue, confusion,  muscle pain or cramps, fast or irregular heartbeat, joint pain Side effects that usually do not require medical attention (report to your care team if they continue or are bothersome): Constipation Diarrhea Fatigue Loss of appetite Nausea This list may not describe all possible side effects. Call your doctor for medical advice about side effects. You may report side effects to FDA at 1-800-FDA-1088. Where should I keep my medication? This medication is given in a hospital or clinic. It will not be stored at home. NOTE: This sheet is a summary. It may not cover all possible information. If you have questions about this medicine, talk to your doctor, pharmacist, or health care provider.  2023 Elsevier/Gold Standard (2021-09-19 00:00:00)    BELOW ARE SYMPTOMS THAT SHOULD BE REPORTED IMMEDIATELY: *FEVER GREATER THAN 100.4 F (38 C) OR HIGHER *CHILLS OR SWEATING *NAUSEA AND VOMITING THAT IS NOT CONTROLLED WITH YOUR NAUSEA MEDICATION *UNUSUAL SHORTNESS OF BREATH *UNUSUAL BRUISING OR BLEEDING *URINARY PROBLEMS (pain or burning when urinating, or frequent urination) *BOWEL PROBLEMS (unusual diarrhea, constipation, pain near the anus) TENDERNESS IN MOUTH AND THROAT WITH OR WITHOUT PRESENCE OF ULCERS (sore throat, sores in mouth, or a toothache) UNUSUAL RASH, SWELLING OR PAIN  UNUSUAL VAGINAL DISCHARGE OR ITCHING   Items with * indicate a potential emergency and should be followed up as soon as possible or go to the Emergency Department if any problems should occur.  Please show the CHEMOTHERAPY ALERT CARD or IMMUNOTHERAPY ALERT CARD at check-in to the Emergency Department and triage nurse.  Should you have questions after your visit or need to cancel or reschedule your appointment, please contact [facility] [phone]  and follow the prompts.  Office hours are 8:00 a.m. to 4:30 p.m. Monday - Friday. Please note that voicemails left after 4:00 p.m. may not be returned  until the following business day.  We are closed weekends and major holidays. You have access to a nurse at all times for urgent questions. Please call the main number to the clinic [phone] and follow the prompts.  For any non-urgent questions, you may also contact your provider using MyChart. We now offer e-Visits for anyone 56 and older to request care online for non-urgent symptoms. For details visit [email or web address].   Also download the MyChart app! Go to the app store, search "MyChart", open the app, select [facility], and log in with your MyChart username and password.

## 2022-05-30 ENCOUNTER — Inpatient Hospital Stay: Payer: Medicare Other

## 2022-05-31 ENCOUNTER — Inpatient Hospital Stay: Payer: Medicare Other

## 2022-05-31 DIAGNOSIS — D630 Anemia in neoplastic disease: Secondary | ICD-10-CM | POA: Diagnosis not present

## 2022-05-31 DIAGNOSIS — Z79899 Other long term (current) drug therapy: Secondary | ICD-10-CM | POA: Diagnosis not present

## 2022-05-31 DIAGNOSIS — Z5112 Encounter for antineoplastic immunotherapy: Secondary | ICD-10-CM | POA: Diagnosis not present

## 2022-05-31 DIAGNOSIS — C9000 Multiple myeloma not having achieved remission: Secondary | ICD-10-CM

## 2022-05-31 MED ORDER — ACETAMINOPHEN 325 MG PO TABS
650.0000 mg | ORAL_TABLET | Freq: Once | ORAL | Status: AC
  Administered 2022-05-31: 650 mg via ORAL
  Filled 2022-05-31: qty 2

## 2022-05-31 MED ORDER — HEPARIN SOD (PORK) LOCK FLUSH 100 UNIT/ML IV SOLN
500.0000 [IU] | Freq: Every day | INTRAVENOUS | Status: AC | PRN
  Administered 2022-05-31: 500 [IU]

## 2022-05-31 MED ORDER — SODIUM CHLORIDE 0.9% IV SOLUTION
250.0000 mL | Freq: Once | INTRAVENOUS | Status: AC
  Administered 2022-05-31: 250 mL via INTRAVENOUS

## 2022-05-31 MED ORDER — DIPHENHYDRAMINE HCL 25 MG PO CAPS
25.0000 mg | ORAL_CAPSULE | Freq: Once | ORAL | Status: AC
  Administered 2022-05-31: 25 mg via ORAL
  Filled 2022-05-31: qty 1

## 2022-05-31 MED ORDER — SODIUM CHLORIDE 0.9% FLUSH
10.0000 mL | INTRAVENOUS | Status: AC | PRN
  Administered 2022-05-31: 10 mL

## 2022-05-31 NOTE — Progress Notes (Signed)
One unit of blood given per orders. Patient tolerated it well without problems. Vitals stable and discharged home from clinic ambulatory. Follow up as scheduled.

## 2022-05-31 NOTE — Patient Instructions (Signed)
[facility]  Discharge Instructions: Thank you for choosing [facility] to provide your oncology and hematology care.  If you have a lab appointment with the [facility], please come in thru the Main Entrance and check in at the main information desk.  Wear comfortable clothing and clothing appropriate for easy access to any Portacath or PICC line.   We strive to give you quality time with your provider. You may need to reschedule your appointment if you arrive late (15 or more minutes).  Arriving late affects you and other patients whose appointments are after yours.  Also, if you miss three or more appointments without notifying the office, you may be dismissed from the clinic at the provider's discretion.      For prescription refill requests, have your pharmacy contact our office and allow 72 hours for refills to be completed.    Today you received one unit of blood   To help prevent nausea and vomiting after your treatment, we encourage you to take your nausea medication as directed.  BELOW ARE SYMPTOMS THAT SHOULD BE REPORTED IMMEDIATELY: *FEVER GREATER THAN 100.4 F (38 C) OR HIGHER *CHILLS OR SWEATING *NAUSEA AND VOMITING THAT IS NOT CONTROLLED WITH YOUR NAUSEA MEDICATION *UNUSUAL SHORTNESS OF BREATH *UNUSUAL BRUISING OR BLEEDING *URINARY PROBLEMS (pain or burning when urinating, or frequent urination) *BOWEL PROBLEMS (unusual diarrhea, constipation, pain near the anus) TENDERNESS IN MOUTH AND THROAT WITH OR WITHOUT PRESENCE OF ULCERS (sore throat, sores in mouth, or a toothache) UNUSUAL RASH, SWELLING OR PAIN  UNUSUAL VAGINAL DISCHARGE OR ITCHING   Items with * indicate a potential emergency and should be followed up as soon as possible or go to the Emergency Department if any problems should occur.  Please show the CHEMOTHERAPY ALERT CARD or IMMUNOTHERAPY ALERT CARD at check-in to the Emergency Department and triage nurse.  Should you have  questions after your visit or need to cancel or reschedule your appointment, please contact [facility] [phone]  and follow the prompts.  Office hours are 8:00 a.m. to 4:30 p.m. Monday - Friday. Please note that voicemails left after 4:00 p.m. may not be returned until the following business day.  We are closed weekends and major holidays. You have access to a nurse at all times for urgent questions. Please call the main number to the clinic [phone] and follow the prompts.  For any non-urgent questions, you may also contact your provider using MyChart. We now offer e-Visits for anyone 13 and older to request care online for non-urgent symptoms. For details visit [email or web address].   Also download the MyChart app! Go to the app store, search "MyChart", open the app, select [facility], and log in with your MyChart username and password.

## 2022-06-02 LAB — TYPE AND SCREEN
ABO/RH(D): O POS
Antibody Screen: POSITIVE
Unit division: 0
Unit division: 0

## 2022-06-02 LAB — BPAM RBC
Blood Product Expiration Date: 202402222359
Blood Product Expiration Date: 202402222359
ISSUE DATE / TIME: 202401260936
Unit Type and Rh: 5100
Unit Type and Rh: 5100

## 2022-06-03 ENCOUNTER — Observation Stay (HOSPITAL_COMMUNITY)
Admission: EM | Admit: 2022-06-03 | Discharge: 2022-06-04 | Disposition: A | Discharge status: Home Health | Payer: Medicare Other | Attending: Family Medicine | Admitting: Family Medicine

## 2022-06-03 ENCOUNTER — Other Ambulatory Visit: Payer: Self-pay

## 2022-06-03 ENCOUNTER — Encounter (HOSPITAL_COMMUNITY): Payer: Self-pay

## 2022-06-03 ENCOUNTER — Emergency Department (HOSPITAL_COMMUNITY): Payer: Medicare Other

## 2022-06-03 DIAGNOSIS — I10 Essential (primary) hypertension: Secondary | ICD-10-CM | POA: Diagnosis not present

## 2022-06-03 DIAGNOSIS — D696 Thrombocytopenia, unspecified: Secondary | ICD-10-CM | POA: Diagnosis not present

## 2022-06-03 DIAGNOSIS — E039 Hypothyroidism, unspecified: Secondary | ICD-10-CM | POA: Diagnosis not present

## 2022-06-03 DIAGNOSIS — Z955 Presence of coronary angioplasty implant and graft: Secondary | ICD-10-CM | POA: Diagnosis not present

## 2022-06-03 DIAGNOSIS — E871 Hypo-osmolality and hyponatremia: Secondary | ICD-10-CM | POA: Diagnosis not present

## 2022-06-03 DIAGNOSIS — Z85828 Personal history of other malignant neoplasm of skin: Secondary | ICD-10-CM | POA: Insufficient documentation

## 2022-06-03 DIAGNOSIS — R410 Disorientation, unspecified: Secondary | ICD-10-CM | POA: Diagnosis not present

## 2022-06-03 DIAGNOSIS — Z79899 Other long term (current) drug therapy: Secondary | ICD-10-CM | POA: Diagnosis not present

## 2022-06-03 DIAGNOSIS — E782 Mixed hyperlipidemia: Secondary | ICD-10-CM

## 2022-06-03 DIAGNOSIS — C9000 Multiple myeloma not having achieved remission: Secondary | ICD-10-CM

## 2022-06-03 DIAGNOSIS — E8809 Other disorders of plasma-protein metabolism, not elsewhere classified: Secondary | ICD-10-CM

## 2022-06-03 DIAGNOSIS — I110 Hypertensive heart disease with heart failure: Secondary | ICD-10-CM | POA: Insufficient documentation

## 2022-06-03 DIAGNOSIS — D539 Nutritional anemia, unspecified: Secondary | ICD-10-CM | POA: Diagnosis not present

## 2022-06-03 DIAGNOSIS — J45909 Unspecified asthma, uncomplicated: Secondary | ICD-10-CM | POA: Diagnosis not present

## 2022-06-03 DIAGNOSIS — I2510 Atherosclerotic heart disease of native coronary artery without angina pectoris: Secondary | ICD-10-CM

## 2022-06-03 DIAGNOSIS — I509 Heart failure, unspecified: Secondary | ICD-10-CM | POA: Insufficient documentation

## 2022-06-03 DIAGNOSIS — R4182 Altered mental status, unspecified: Principal | ICD-10-CM

## 2022-06-03 DIAGNOSIS — E46 Unspecified protein-calorie malnutrition: Secondary | ICD-10-CM

## 2022-06-03 DIAGNOSIS — Z87891 Personal history of nicotine dependence: Secondary | ICD-10-CM | POA: Insufficient documentation

## 2022-06-03 DIAGNOSIS — Z9861 Coronary angioplasty status: Secondary | ICD-10-CM

## 2022-06-03 DIAGNOSIS — I440 Atrioventricular block, first degree: Secondary | ICD-10-CM | POA: Diagnosis not present

## 2022-06-03 LAB — CBC WITH DIFFERENTIAL/PLATELET
Abs Immature Granulocytes: 0.02 10*3/uL (ref 0.00–0.07)
Basophils Absolute: 0 10*3/uL (ref 0.0–0.1)
Basophils Relative: 0 %
Eosinophils Absolute: 0.1 10*3/uL (ref 0.0–0.5)
Eosinophils Relative: 2 %
HCT: 27 % — ABNORMAL LOW (ref 39.0–52.0)
Hemoglobin: 8.8 g/dL — ABNORMAL LOW (ref 13.0–17.0)
Immature Granulocytes: 0 %
Lymphocytes Relative: 12 %
Lymphs Abs: 0.5 10*3/uL — ABNORMAL LOW (ref 0.7–4.0)
MCH: 33.1 pg (ref 26.0–34.0)
MCHC: 32.6 g/dL (ref 30.0–36.0)
MCV: 101.5 fL — ABNORMAL HIGH (ref 80.0–100.0)
Monocytes Absolute: 0.6 10*3/uL (ref 0.1–1.0)
Monocytes Relative: 13 %
Neutro Abs: 3.4 10*3/uL (ref 1.7–7.7)
Neutrophils Relative %: 73 %
Platelets: 95 10*3/uL — ABNORMAL LOW (ref 150–400)
RBC: 2.66 MIL/uL — ABNORMAL LOW (ref 4.22–5.81)
RDW: 26.5 % — ABNORMAL HIGH (ref 11.5–15.5)
WBC: 4.6 10*3/uL (ref 4.0–10.5)
nRBC: 0 % (ref 0.0–0.2)

## 2022-06-03 LAB — COMPREHENSIVE METABOLIC PANEL
ALT: 26 U/L (ref 0–44)
AST: 20 U/L (ref 15–41)
Albumin: 2.5 g/dL — ABNORMAL LOW (ref 3.5–5.0)
Alkaline Phosphatase: 59 U/L (ref 38–126)
Anion gap: 7 (ref 5–15)
BUN: 19 mg/dL (ref 8–23)
CO2: 22 mmol/L (ref 22–32)
Calcium: 8.3 mg/dL — ABNORMAL LOW (ref 8.9–10.3)
Chloride: 100 mmol/L (ref 98–111)
Creatinine, Ser: 0.75 mg/dL (ref 0.61–1.24)
GFR, Estimated: >60 mL/min (ref >60)
Glucose, Bld: 129 mg/dL — ABNORMAL HIGH (ref 70–99)
Potassium: 3.7 mmol/L (ref 3.5–5.1)
Sodium: 129 mmol/L — ABNORMAL LOW (ref 135–145)
Total Bilirubin: 0.4 mg/dL (ref 0.3–1.2)
Total Protein: 8.1 g/dL (ref 6.5–8.1)

## 2022-06-03 LAB — TROPONIN I (HIGH SENSITIVITY)
Troponin I (High Sensitivity): 7 ng/L (ref <18)
Troponin I (High Sensitivity): 7 ng/L (ref <18)

## 2022-06-03 LAB — CBG MONITORING, ED: Glucose-Capillary: 144 mg/dL — ABNORMAL HIGH (ref 70–99)

## 2022-06-03 NOTE — H&P (Signed)
History and Physical    Patient: Dale [name] HWE:[id number] DOB: 04/09/1929 DOA: 06/03/2022 DOS: the patient was seen and examined on 06/04/2022 PCP: Pcp, No  Patient coming from: Home  Chief Complaint:  Chief Complaint  Patient presents with   Altered Mental Status   HPI: Dale [name] is a 89 y.o. male with medical history significant of multiple myeloma (follows with Dr. Delton Coombes) CAD s/p stent placement, hyperlipidemia, hypothyroidism, hypertension who presents to the emergency department from home via EMS due to altered mental status.  At bedside, patient was unable to provide details regarding why he came to the emergency department, he states that he believed that he felt dizzy last night and that he was fine at this time.  Most of the history was obtained from the ED physician and ED medical record.  Per report, patient suddenly became confused and unable to answer questions during dinner, he was awake and still speaking, but was confused.  EMS was activated, but by the time EMS team arrived, patient has been back to baseline and said that he did not remember what happened.  He denied headache, vision changes.  Patient was able to move all extremities without any weakness.  ED Course:  In the emergency department, BP was 153/107, but other vital signs were within normal range.  Workup in the ED showed macrocytic anemia, thrombocytopenia, BMP was normal except for sodium of 129 and blood glucose of 129.  Albumin 2.5.  Troponin x 2 was flat at 7. CT head without contrast showed no evidence of acute intracranial abnormality.  Small vessel ischemic changes. Hospitalist was asked to admit patient for further evaluation and management.  Review of Systems: Review of systems as noted in the HPI. All other systems reviewed and are negative.   Past Medical History:  Diagnosis Date   Allergic rhinitis    Anal fissure    Anginal pain (HCC)    Asthma    Cancer (HCC)    skin   Chest  pain    CHF (congestive [name] failure) ([place])    Coronary [name] disease    s/p stenting. 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   CORONARY ANGIOPLASTY WITH STENT PLACEMENT     about 10 years ago per pt (around 2007)   [place], URETEROSCOPY AND STENT PLACEMENT Left 06/16/2017   Procedure: CYSTOSCOPY WITH RETROGRADE PYELOGRAM, URETEROSCOPY,STONE EXTRACTION  AND STENT PLACEMENT;  Surgeon: Franchot Gallo, MD;  Location: [facility];  Service: Urology;  Laterality: Left;   ESOPHAGOGASTRODUODENOSCOPY  06/19/2004   Two esophageal rings and esophageal web as described above.  All of these were disrupted by passing 56-French Venia Minks dilator/ Candida esophagitis,which appears to be incidental given history of   antibiotic use, but nevertheless will be treated.   ESOPHAGOGASTRODUODENOSCOPY  10/30/2006   Distal  tandem esophageal ring status post dilation disruption as  described above.  Otherwise normal esophagus/  Small hiatal hernia otherwise normal stomach, D1 and D2   ESOPHAGOGASTRODUODENOSCOPY N/A 03/22/2015   Dr.Rourk- noncritical schatzki's ring and hiatal hernia-o/w normal EGD.    ESOPHAGOGASTRODUODENOSCOPY (EGD) WITH ESOPHAGEAL DILATION  03/04/2012   RMR- schatzki's ring, hiatal hernia, polypoid gastric mucosa, bx= minimally active gastritis.   HOLMIUM LASER APPLICATION Left 1/63/8453   Procedure:  HOLMIUM LASER APPLICATION;  Surgeon: Franchot Gallo, MD;  Location: [facility];  Service: Urology;  Laterality: Left;   IR CV LINE INJECTION  12/27/2021   IR GENERIC HISTORICAL  03/06/2016   IR RADIOLOGIST EVAL & MGMT 03/06/2016 Aletta Edouard, MD [facility] INTERV RAD   IR GENERIC HISTORICAL  06/18/2016   IR RADIOLOGIST EVAL & MGMT 06/18/2016 Aletta Edouard, MD [facility] INTERV RAD   IR IMAGING GUIDED PORT INSERTION  12/18/2021   IR IMAGING GUIDED PORT INSERTION  01/15/2022   IR RADIOLOGIST EVAL & MGMT  10/01/2016   IR RADIOLOGIST EVAL & MGMT  10/15/2017   IR RADIOLOGIST EVAL & MGMT  12/24/2018   IR RADIOLOGIST EVAL & MGMT  01/04/2020   IR RADIOLOGIST EVAL & MGMT  06/20/2021   IR REMOVAL TUN ACCESS W/ PORT W/O FL MOD SED  01/15/2022   LEFT [name] CATH N/A 02/02/2012   Procedure: LEFT [name] CATH;  Surgeon: Lorretta Harp, MD;  Location: [facility] CATH LAB;  Service: Cardiovascular;  Laterality: N/A;   MEDIASTINOSCOPY     for dx sarcoid   RADIOLOGY WITH ANESTHESIA Left 05/17/2016   Procedure: left renal ablation;  Surgeon: Aletta Edouard, MD;  Location: [facility];  Service: Radiology;  Laterality: Left;    Social History:  reports that he quit smoking about 52 years ago. His smoking use included cigarettes and cigars. He has a 0.20 pack-year smoking history. He has never used smokeless tobacco. He reports that he does not drink alcohol and does not use drugs.   Allergies  Allergen Reactions   Azithromycin Other (See Comments)    Sore mouth and fever blisters around mouth, sores in nose area as well   Doxazosin Shortness Of Breath   Atenolol Other (See Comments)    UNKNOWN REACTION   Hydrocodone Nausea And Vomiting   Levofloxacin Other (See Comments)    Caused stomach problems.   Morphine Other (See Comments)    "made me crazy"   Penicillins Nausea And Vomiting and Other (See Comments)    Has patient had a PCN reaction causing immediate rash, facial/tongue/throat swelling, SOB or lightheadedness with  hypotension: No Has patient had a PCN reaction causing severe rash involving mucus membranes or skin necrosis: No Has patient had a PCN reaction that required hospitalization No Has patient had a PCN reaction occurring within the last 10 years: No If all of the above answers are "NO", then may proceed with Cephalosporin use.    Sulfonamide Derivatives Nausea And Vomiting    Family History  Problem Relation Age of Onset   [name] disease Father        deceased age 92   Stroke Mother    Alzheimer's disease Mother    [name] attack Brother        deceased age 18   Cancer Other        niece   Colon cancer Neg Hx      Prior to Admission medications   Medication Sig Start Date End Date Taking? Authorizing Provider  acetaminophen (TYLENOL) 325 MG tablet  Take 2 tablets (650 mg total) by mouth every 6 (six) hours as needed for mild pain (or Fever >/= 101). 01/17/22   Barton Dubois, MD  acyclovir (ZOVIRAX) 400 MG tablet TAKE 1 TABLET BY MOUTH TWICE  DAILY 05/20/22   Derek Jack, MD  Ascorbic Acid (VITAMIN C PO) Take 500 mg by mouth every evening.    [provider]  atorvastatin (LIPITOR) 40 MG tablet TAKE 1 TABLET BY MOUTH IN  THE EVENING Patient taking differently: Take 40 mg by mouth daily. 09/10/21   Troy Sine, MD  Bortezomib (VELCADE IJ) Inject as directed once a week.    [provider]  brimonidine (ALPHAGAN) 0.2 % ophthalmic solution Place 1 drop into the left eye 2 (two) times daily.    [provider]  cyanocobalamin 1000 MCG tablet Take 1 tablet (1,000 mcg total) by mouth daily. 01/18/22   Barton Dubois, MD  dexamethasone (DECADRON) 2 MG tablet TAKE 5 TABLETS BY MOUTH ONCE  WEEKLY 04/16/22   Derek Jack, MD  dorzolamide-timolol (COSOPT) 22.3-6.8 MG/ML ophthalmic solution Place 1 drop into the left eye 2 (two) times daily.    [provider]  feeding supplement (ENSURE ENLIVE / ENSURE PLUS) LIQD Take 237 mLs by mouth 3 (three)  times daily between meals. 01/17/22   Barton Dubois, MD  isosorbide mononitrate (IMDUR) 60 MG 24 hr tablet Take 1 tablet (60 mg total) by mouth daily. TAKE 1 AND 1/2 TABLETS BY  MOUTH IN THE MORNING AND  1/2 TABLET AT NIGHT 01/17/22   Barton Dubois, MD  levothyroxine (SYNTHROID) 75 MCG tablet Take 75 mcg by mouth daily before breakfast. 07/16/21   [provider]  lidocaine-prilocaine (EMLA) cream Apply 1 Application topically as needed. 12/20/21   Derek Jack, MD  losartan (COZAAR) 100 MG tablet TAKE 1 TABLET BY MOUTH DAILY 04/10/22   Troy Sine, MD  metoprolol tartrate (LOPRESSOR) 25 MG tablet TAKE 1 TABLET BY MOUTH IN  THE MORNING AND ONE-HALF  TABLET BY MOUTH IN THE  EVENING Patient taking differently: Take 12.5-25 mg by mouth in the morning and at bedtime. TAKE 1 TABLET BY MOUTH IN  THE MORNING AND ONE-HALF  TABLET BY MOUTH IN THE  EVENING 07/13/21   Troy Sine, MD  Multiple Vitamins-Minerals (PRESERVISION/LUTEIN) CAPS Take 1 capsule by mouth 2 (two) times daily.    [provider]  nitroGLYCERIN (NITROSTAT) 0.4 MG SL tablet Place 1 tablet (0.4 mg total) under the tongue every 5 (five) minutes as needed. For chest pain 02/15/22   Troy Sine, MD  pantoprazole (PROTONIX) 40 MG tablet TAKE 1 TABLET BY MOUTH DAILY  BEFORE BREAKFAST 01/28/22   Sherron Monday, NP  potassium chloride SA (KLOR-CON M) 20 MEQ tablet TAKE 1 TABLET BY MOUTH DAILY 05/16/22   Troy Sine, MD  prochlorperazine (COMPAZINE) 10 MG tablet Take 1 tablet (10 mg total) by mouth every 6 (six) hours as needed for nausea or vomiting. 12/20/21   Derek Jack, MD  ROCKLATAN 0.02-0.005 % SOLN Place 1 drop into the left eye at bedtime. 12/21/19   [provider]  triamcinolone (KENALOG) 0.1 % cream Apply 1 application. topically 2 (two) times daily as needed (for irritation).    [provider]  vitamin E 200 UNIT capsule Take 200 Units by mouth every evening.    [provider]    Physical Exam: BP (!) 150/64   Pulse (!) 59   Temp 97.6 F (36.4 C) (  Oral)   Resp 14   Ht '5\' 8"'$  (1.727 m)   Wt 56.7 kg   SpO2 100%   BMI 19.01 kg/m   General: 89 y.o. year-old male well developed well nourished in no acute distress.  Alert and oriented x3. HEENT: NCAT, EOMI, dry mucous membrane. Neck: Supple, trachea medial Cardiovascular: Regular rate and rhythm with no rubs or gallops.  No thyromegaly or JVD noted.  No lower extremity edema. 2/4 pulses in all 4 extremities. Respiratory: Clear to auscultation with no wheezes or rales. Good inspiratory effort. Abdomen: Soft, nontender nondistended with normal bowel sounds x4 quadrants. Muskuloskeletal: No cyanosis, clubbing or edema noted bilaterally Neuro: CN II-XII intact, strength 5/5 x 4, sensation, reflexes intact Skin: No ulcerative lesions noted or rashes Psychiatry: Mood is appropriate for condition and setting          Labs on Admission:  Basic Metabolic Panel: Recent Labs  Lab 05/29/22 0750 06/03/22 2027  NA 132* 129*  K 3.5 3.7  CL 104 100  CO2 22 22  GLUCOSE 91 129*  BUN 19 19  CREATININE 0.87 0.75  CALCIUM 8.2* 8.3*   Liver Function Tests: Recent Labs  Lab 05/29/22 0750 06/03/22 2027  AST 22 20  ALT 27 26  ALKPHOS 57 59  BILITOT 0.6 0.4  PROT 8.2* 8.1  ALBUMIN 2.6* 2.5*   No results for input(s): "LIPASE", "AMYLASE" in the last 168 hours. No results for input(s): "AMMONIA" in the last 168 hours. CBC: Recent Labs  Lab 05/29/22 0750 06/03/22 2027  WBC 4.9 4.6  NEUTROABS 3.7 3.4  HGB 7.7* 8.8*  HCT 24.0* 27.0*  MCV 102.6* 101.5*  PLT 88* 95*   Cardiac Enzymes: No results for input(s): "CKTOTAL", "CKMB", "CKMBINDEX", "TROPONINI" in the last 168 hours.  BNP (last 3 results) Recent Labs    07/27/21 1604 07/29/21 0826  BNP 642.0* 252.0*    ProBNP (last 3 results) No results for input(s): "PROBNP" in the last 8760 hours.  CBG: Recent Labs  Lab 06/03/22 2027   GLUCAP 144*    Radiological Exams on Admission: CT Head Wo Contrast  Result Date: 06/03/2022 CLINICAL DATA:  Altered mental status, confusion EXAM: CT HEAD WITHOUT CONTRAST TECHNIQUE: Contiguous axial images were obtained from the base of the skull through the vertex without intravenous contrast. RADIATION DOSE REDUCTION: This exam was performed according to the departmental dose-optimization program which includes automated exposure control, adjustment of the mA and/or kV according to patient size and/or use of iterative reconstruction technique. COMPARISON:  01/16/2022 FINDINGS: Brain: No evidence of acute infarction, hemorrhage, hydrocephalus, extra-axial collection or mass lesion/mass effect. Subcortical white matter and periventricular small vessel ischemic changes. Vascular: Intracranial atherosclerosis. Skull: Normal. Negative for fracture or focal lesion. Sinuses/Orbits: Chronic expansile opacification of the left maxillary sinus. Visualized paranasal sinuses and mastoid air cells are otherwise clear. Other: None. IMPRESSION: No evidence of acute intracranial abnormality. Small vessel ischemic changes. Electronically Signed   By: Julian Hy M.D.   On: 06/03/2022 21:27    EKG: I independently viewed the EKG done and my findings are as followed: Normal sinus/ectopic atrial rhythm at a rate of 68 bpm  Assessment/Plan Present on Admission:  Altered mental status  Thrombocytopenia (HCC)  Hyponatremia  Hypoalbuminemia due to protein-calorie malnutrition [facility])  Essential hypertension  Mixed hyperlipidemia  Multiple myeloma ([place])  CAD, RCA stent, RCA new DES July 2009, Cath [place] 2011, 02/02/12  Principal Problem:   Altered mental status Active Problems:   Thrombocytopenia ([place])  Essential hypertension   CAD, RCA stent, RCA new DES July 2009, Cath [place] 2011, 02/02/12   Multiple myeloma (HCC)   Mixed hyperlipidemia   Hypoalbuminemia due to protein-calorie malnutrition (HCC)    Hyponatremia   Macrocytic anemia  Altered mental status, rule out TIA Patient will be admitted to telemetry unit  Bilateral carotid ultrasound in the morning Echocardiogram in the morning MRI of brain without contrast in the morning Continue aspirin and statin Continue fall precautions and neuro checks Lipid panel and hemoglobin A1c will be checked Continue PT/OT eval and treat Bedside swallow eval by nursing prior to diet Consider tele neurology consult status post imaging studies  Macrocytic anemia MCV 101.5, folate and vitamin B12 levels will be checked Continue vitamin B12  Chronic thrombocytopenia Platelets at 95, continue to monitor platelet levels with morning labs  Hyponatremia possibly secondary to dehydration Sodium 129, , Continue IV hydration  Hypoalbuminemia possibly secondary to moderate protein calorie malnutrition Albumin 2.5, protein supplement will be provided  Essential hypertension  Antihypertensives PRN if Blood pressure is greater than 220/120 or there is a concern for End organ damage/contraindications for permissive HTN. If blood pressure is greater than 220/120 give labetalol PO or IV or Vasotec IV with a goal of 15% reduction in BP during the first 24 hours.  Mixed hyperlipidemia Continue Lipitor  Acquired hypothyroidism Continue Synthroid  CAD s/p stent placement Continue Lipitor, aspirin Lopressor held at this time due to permissive hypertension  Multiple myeloma Patient on Velcade and daratumumab with last infusion being on 05/29/2022 Patient follows with Dr. Delton Coombes  DVT prophylaxis: SCDs  Code Status: Full code  Family Communication: None at bedside  Consults: None  Severity of Illness: The appropriate patient status for this patient is OBSERVATION. Observation status is judged to be reasonable and necessary in order to provide the required intensity of service to ensure the patient's safety. The patient's presenting symptoms,  physical exam findings, and initial radiographic and laboratory data in the context of their medical condition is felt to place them at decreased risk for further clinical deterioration. Furthermore, it is anticipated that the patient will be medically stable for discharge from the hospital within 2 midnights of admission.   Author: Bernadette Hoit, DO 06/04/2022 5:33 AM  For on call review [email or web address].

## 2022-06-03 NOTE — ED Provider Notes (Signed)
[facility] Provider Note   CSN: [id number] Arrival date & time: 06/03/22  2017     History  Chief Complaint  Patient presents with   Altered Mental Status    Dale [name] is a 89 y.o. male.   Altered Mental Status Patient reported brought in for mental status change.  Reportedly during dinner became confused and was unable to answer questions.  Sounds as if was still speaking and conscious but please confused.  After EMS arrived patient became back to baseline.  States he does not remember what happened however.  No headache.  No other real complaints.    Past Medical History:  Diagnosis Date   Allergic rhinitis    Anal fissure    Anginal pain (HCC)    Asthma    Cancer (HCC)    skin   Chest pain    CHF (congestive heart failure) ([place])    Coronary heart disease    s/p stenting. cath in 01/2012 noncritical occlusion   Dyspnea    Dysrhythmia    1st degree heart block   GERD (gastroesophageal reflux disease)    Glaucoma    Hiatal hernia    Hyperlipidemia    Hypertension    Hypothyroidism    Idiopathic thrombocytopenic purpura ([place]) 2002   Macular degeneration    Multiple myeloma ([place]) 01/15/2021   Nephrolithiasis    PUD (peptic ulcer disease)    remote   Sarcoidosis    pulmonary   Schatzki's ring     Home Medications Prior to Admission medications   Medication Sig Start Date End Date Taking? Authorizing Provider  acetaminophen (TYLENOL) 325 MG tablet Take 2 tablets (650 mg total) by mouth every 6 (six) hours as needed for mild pain (or Fever >/= 101). 01/17/22   Barton Dubois, MD  acyclovir (ZOVIRAX) 400 MG tablet TAKE 1 TABLET BY MOUTH TWICE  DAILY 05/20/22   Derek Jack, MD  Ascorbic Acid (VITAMIN C PO) Take 500 mg by mouth every evening.    [provider]  atorvastatin (LIPITOR) 40 MG tablet TAKE 1 TABLET BY MOUTH IN  THE EVENING Patient taking differently: Take 40 mg by mouth daily. 09/10/21    Troy Sine, MD  Bortezomib (VELCADE IJ) Inject as directed once a week.    [provider]  brimonidine (ALPHAGAN) 0.2 % ophthalmic solution Place 1 drop into the left eye 2 (two) times daily.    [provider]  cyanocobalamin 1000 MCG tablet Take 1 tablet (1,000 mcg total) by mouth daily. 01/18/22   Barton Dubois, MD  dexamethasone (DECADRON) 2 MG tablet TAKE 5 TABLETS BY MOUTH ONCE  WEEKLY 04/16/22   Derek Jack, MD  dorzolamide-timolol (COSOPT) 22.3-6.8 MG/ML ophthalmic solution Place 1 drop into the left eye 2 (two) times daily.    [provider]  feeding supplement (ENSURE ENLIVE / ENSURE PLUS) LIQD Take 237 mLs by mouth 3 (three) times daily between meals. 01/17/22   Barton Dubois, MD  isosorbide mononitrate (IMDUR) 60 MG 24 hr tablet Take 1 tablet (60 mg total) by mouth daily. TAKE 1 AND 1/2 TABLETS BY  MOUTH IN THE MORNING AND  1/2 TABLET AT NIGHT 01/17/22   Barton Dubois, MD  levothyroxine (SYNTHROID) 75 MCG tablet Take 75 mcg by mouth daily before breakfast. 07/16/21   [provider]  lidocaine-prilocaine (EMLA) cream Apply 1 Application topically as needed. 12/20/21   Derek Jack, MD  losartan (COZAAR) 100 MG tablet  TAKE 1 TABLET BY MOUTH DAILY 04/10/22   Troy Sine, MD  metoprolol tartrate (LOPRESSOR) 25 MG tablet TAKE 1 TABLET BY MOUTH IN  THE MORNING AND ONE-HALF  TABLET BY MOUTH IN THE  EVENING Patient taking differently: Take 12.5-25 mg by mouth in the morning and at bedtime. TAKE 1 TABLET BY MOUTH IN  THE MORNING AND ONE-HALF  TABLET BY MOUTH IN THE  EVENING 07/13/21   Troy Sine, MD  Multiple Vitamins-Minerals (PRESERVISION/LUTEIN) CAPS Take 1 capsule by mouth 2 (two) times daily.    [provider]  nitroGLYCERIN (NITROSTAT) 0.4 MG SL tablet Place 1 tablet (0.4 mg total) under the tongue every 5 (five) minutes as needed. For chest pain 02/15/22   Troy Sine, MD  pantoprazole (PROTONIX) 40 MG tablet  TAKE 1 TABLET BY MOUTH DAILY  BEFORE BREAKFAST 01/28/22   Sherron Monday, NP  potassium chloride SA (KLOR-CON M) 20 MEQ tablet TAKE 1 TABLET BY MOUTH DAILY 05/16/22   Troy Sine, MD  prochlorperazine (COMPAZINE) 10 MG tablet Take 1 tablet (10 mg total) by mouth every 6 (six) hours as needed for nausea or vomiting. 12/20/21   Derek Jack, MD  ROCKLATAN 0.02-0.005 % SOLN Place 1 drop into the left eye at bedtime. 12/21/19   [provider]  triamcinolone (KENALOG) 0.1 % cream Apply 1 application. topically 2 (two) times daily as needed (for irritation).    [provider]  vitamin E 200 UNIT capsule Take 200 Units by mouth every evening.    [provider]      Allergies    Azithromycin, Doxazosin, Atenolol, Hydrocodone, Levofloxacin, Morphine, Penicillins, and Sulfonamide derivatives    Review of Systems   Review of Systems  Physical Exam Updated Vital Signs BP (!) 153/107 (BP Location: Left Arm)   Pulse 67   Temp 97.6 F (36.4 C) (Oral)   Resp 20   Ht '5\' 8"'$  (1.727 m)   Wt 56.7 kg   SpO2 100%   BMI 19.01 kg/m  Physical Exam Vitals and nursing note reviewed.  Eyes:     Pupils: Pupils are equal, round, and reactive to light.  Cardiovascular:     Rate and Rhythm: Regular rhythm.  Pulmonary:     Effort: Pulmonary effort is normal.  Abdominal:     Tenderness: There is no abdominal tenderness.  Musculoskeletal:        General: Tenderness present.     Cervical back: Neck supple.  Neurological:     General: No focal deficit present.     Mental Status: He is alert and oriented to person, place, and time.     ED Results / Procedures / Treatments   Labs (all labs ordered are listed, but only abnormal results are displayed) Labs Reviewed  COMPREHENSIVE METABOLIC PANEL - Abnormal; Notable for the following components:      Result Value   Sodium 129 (*)    Glucose, Bld 129 (*)    Calcium 8.3 (*)    Albumin 2.5 (*)    All other components  within normal limits  CBC WITH DIFFERENTIAL/PLATELET - Abnormal; Notable for the following components:   RBC 2.66 (*)    Hemoglobin 8.8 (*)    HCT 27.0 (*)    MCV 101.5 (*)    RDW 26.5 (*)    Platelets 95 (*)    Lymphs Abs 0.5 (*)    All other components within normal limits  CBG MONITORING, ED - Abnormal; Notable  for the following components:   Glucose-Capillary 144 (*)    All other components within normal limits  TROPONIN I (HIGH SENSITIVITY)  TROPONIN I (HIGH SENSITIVITY)    EKG None  Radiology CT Head Wo Contrast  Result Date: 06/03/2022 CLINICAL DATA:  Altered mental status, confusion EXAM: CT HEAD WITHOUT CONTRAST TECHNIQUE: Contiguous axial images were obtained from the base of the skull through the vertex without intravenous contrast. RADIATION DOSE REDUCTION: This exam was performed according to the departmental dose-optimization program which includes automated exposure control, adjustment of the mA and/or kV according to patient size and/or use of iterative reconstruction technique. COMPARISON:  01/16/2022 FINDINGS: Brain: No evidence of acute infarction, hemorrhage, hydrocephalus, extra-axial collection or mass lesion/mass effect. Subcortical white matter and periventricular small vessel ischemic changes. Vascular: Intracranial atherosclerosis. Skull: Normal. Negative for fracture or focal lesion. Sinuses/Orbits: Chronic expansile opacification of the left maxillary sinus. Visualized paranasal sinuses and mastoid air cells are otherwise clear. Other: None. IMPRESSION: No evidence of acute intracranial abnormality. Small vessel ischemic changes. Electronically Signed   By: Julian Hy M.D.   On: 06/03/2022 21:27    Procedures Procedures    Medications Ordered in ED Medications - No data to display  ED Course/ Medical Decision Making/ A&P                             Medical Decision Making Amount and/or Complexity of Data Reviewed Labs: ordered. Radiology:  ordered.   Patient with episode of mental status change.  Report confusion may be some difficulty speaking.  Back at baseline now.  Last a few minutes.  No headache.  No confusion.  Differential diagnosis includes TIA, encephalopathy, near syncope.  Will get basic blood work and head CT.  Will get EKG.  Head CT reassuring.  Blood work near baseline but sodium has decreased down to 129.  Looks like has been 131 recently.  With encephalopathy and comorbidities I feel he would benefit from mission the hospital for further workup of the mental status change.  Discussed with patient's friend who was in the room.  Will discuss with hospitalist.        Final Clinical Impression(s) / ED Diagnoses Final diagnoses:  Altered mental status, unspecified altered mental status type    Rx / DC Orders ED Discharge Orders     None         Davonna Belling, MD 06/03/22 2239

## 2022-06-03 NOTE — ED Notes (Signed)
Augustin Schooling if Izora Gala does not answer [phone]

## 2022-06-03 NOTE — ED Triage Notes (Signed)
Pt arrived by EMS from home. Pt wife called EMS due to pt having sudden onset of AMS during dinner. Pt became confused and was unable to answer orientation questions.   On arrival to ED pt is alert and oriented denies any pain. Pt is able to move all extremities with equal strength. No vision changes or headaches present   Dr. Alvino Chapel at bedside for EMS report

## 2022-06-04 ENCOUNTER — Observation Stay (HOSPITAL_BASED_OUTPATIENT_CLINIC_OR_DEPARTMENT_OTHER): Payer: Medicare Other

## 2022-06-04 ENCOUNTER — Observation Stay (HOSPITAL_COMMUNITY): Payer: Medicare Other

## 2022-06-04 DIAGNOSIS — R29818 Other symptoms and signs involving the nervous system: Secondary | ICD-10-CM | POA: Diagnosis not present

## 2022-06-04 DIAGNOSIS — R4182 Altered mental status, unspecified: Secondary | ICD-10-CM | POA: Diagnosis not present

## 2022-06-04 DIAGNOSIS — E871 Hypo-osmolality and hyponatremia: Secondary | ICD-10-CM

## 2022-06-04 DIAGNOSIS — R404 Transient alteration of awareness: Secondary | ICD-10-CM

## 2022-06-04 DIAGNOSIS — D696 Thrombocytopenia, unspecified: Secondary | ICD-10-CM | POA: Diagnosis not present

## 2022-06-04 DIAGNOSIS — I6523 Occlusion and stenosis of bilateral carotid arteries: Secondary | ICD-10-CM | POA: Diagnosis not present

## 2022-06-04 DIAGNOSIS — I10 Essential (primary) hypertension: Secondary | ICD-10-CM | POA: Diagnosis not present

## 2022-06-04 DIAGNOSIS — D539 Nutritional anemia, unspecified: Secondary | ICD-10-CM | POA: Insufficient documentation

## 2022-06-04 DIAGNOSIS — C9000 Multiple myeloma not having achieved remission: Secondary | ICD-10-CM | POA: Diagnosis not present

## 2022-06-04 LAB — FOLATE: Folate: 13.6 ng/mL (ref >5.9)

## 2022-06-04 LAB — MAGNESIUM: Magnesium: 2 mg/dL (ref 1.7–2.4)

## 2022-06-04 LAB — HEMOGLOBIN A1C
Hgb A1c MFr Bld: 6.3 % — ABNORMAL HIGH (ref 4.8–5.6)
Mean Plasma Glucose: 134.11 mg/dL

## 2022-06-04 LAB — LIPID PANEL
Cholesterol: 96 mg/dL (ref 0–200)
HDL: 32 mg/dL — ABNORMAL LOW (ref >40)
LDL Cholesterol: 48 mg/dL (ref 0–99)
Total CHOL/HDL Ratio: 3 RATIO
Triglycerides: 79 mg/dL (ref <150)
VLDL: 16 mg/dL (ref 0–40)

## 2022-06-04 LAB — ECHOCARDIOGRAM LIMITED
Area-P 1/2: 3.85 cm2
Height: 68 in
S' Lateral: 2.6 cm
Weight: 2000 oz

## 2022-06-04 LAB — PHOSPHORUS: Phosphorus: <1 mg/dL — CL (ref 2.5–4.6)

## 2022-06-04 MED ORDER — SODIUM CHLORIDE 0.9 % IV SOLN: INTRAVENOUS | Status: DC

## 2022-06-04 MED ORDER — K PHOS MONO-SOD PHOS DI & MONO 155-852-130 MG PO TABS: 250.0000 mg | ORAL_TABLET | Freq: Two times a day (BID) | ORAL | 0 refills | Status: DC

## 2022-06-04 MED ORDER — ONDANSETRON HCL 4 MG PO TABS: 4.0000 mg | ORAL_TABLET | Freq: Four times a day (QID) | ORAL | Status: DC | PRN

## 2022-06-04 MED ORDER — ASPIRIN 81 MG PO TBEC
81.0000 mg | DELAYED_RELEASE_TABLET | Freq: Every day | ORAL | Status: DC
  Administered 2022-06-04: 81 mg via ORAL
  Filled 2022-06-04: qty 1

## 2022-06-04 MED ORDER — ONDANSETRON HCL 4 MG/2ML IJ SOLN: 4.0000 mg | Freq: Four times a day (QID) | INTRAMUSCULAR | Status: DC | PRN

## 2022-06-04 MED ORDER — ACETAMINOPHEN 650 MG RE SUPP: 650.0000 mg | Freq: Four times a day (QID) | RECTAL | Status: DC | PRN

## 2022-06-04 MED ORDER — ATORVASTATIN CALCIUM 40 MG PO TABS
40.0000 mg | ORAL_TABLET | Freq: Every evening | ORAL | Status: DC
  Administered 2022-06-04: 40 mg via ORAL
  Filled 2022-06-04: qty 1

## 2022-06-04 MED ORDER — HYDRALAZINE HCL 20 MG/ML IJ SOLN: 10.0000 mg | Freq: Four times a day (QID) | INTRAMUSCULAR | Status: DC | PRN

## 2022-06-04 MED ORDER — ACETAMINOPHEN 325 MG PO TABS: 650.0000 mg | ORAL_TABLET | Freq: Four times a day (QID) | ORAL | Status: DC | PRN

## 2022-06-04 MED ORDER — K PHOS MONO-SOD PHOS DI & MONO 155-852-130 MG PO TABS
500.0000 mg | ORAL_TABLET | Freq: Every day | ORAL | Status: DC
  Administered 2022-06-04: 500 mg via ORAL
  Filled 2022-06-04: qty 2

## 2022-06-04 MED ORDER — HYDRALAZINE HCL 20 MG/ML IJ SOLN
10.0000 mg | Freq: Four times a day (QID) | INTRAMUSCULAR | Status: DC | PRN
  Administered 2022-06-04: 10 mg via INTRAVENOUS
  Filled 2022-06-04: qty 1

## 2022-06-04 MED ORDER — LEVOTHYROXINE SODIUM 50 MCG PO TABS: 75.0000 ug | ORAL_TABLET | Freq: Every day | ORAL | Status: DC

## 2022-06-04 MED ORDER — ASPIRIN 81 MG PO TBEC: 81.0000 mg | DELAYED_RELEASE_TABLET | Freq: Every day | ORAL | 0 refills | Status: AC

## 2022-06-04 MED ORDER — LEVOTHYROXINE SODIUM 75 MCG PO TABS
75.0000 ug | ORAL_TABLET | Freq: Every day | ORAL | Status: DC
  Administered 2022-06-04: 75 ug via ORAL
  Filled 2022-06-04: qty 2

## 2022-06-04 MED ORDER — POTASSIUM PHOSPHATES 15 MMOLE/5ML IV SOLN
30.0000 mmol | Freq: Once | INTRAVENOUS | Status: AC
  Administered 2022-06-04: 30 mmol via INTRAVENOUS
  Filled 2022-06-04: qty 10

## 2022-06-04 MED ORDER — ENSURE ENLIVE PO LIQD
237.0000 mL | Freq: Two times a day (BID) | ORAL | Status: DC
  Filled 2022-06-04 (×6): qty 237

## 2022-06-04 MED ORDER — PANTOPRAZOLE SODIUM 40 MG PO TBEC
40.0000 mg | DELAYED_RELEASE_TABLET | Freq: Every day | ORAL | Status: DC
  Administered 2022-06-04: 40 mg via ORAL
  Filled 2022-06-04: qty 1

## 2022-06-04 MED ORDER — VITAMIN B-12 1000 MCG PO TABS
1000.0000 ug | ORAL_TABLET | Freq: Every day | ORAL | Status: DC
  Administered 2022-06-04: 1000 ug via ORAL
  Filled 2022-06-04: qty 1

## 2022-06-04 NOTE — Discharge Instructions (Addendum)
1)Avoid ibuprofen/Advil/Aleve/Motrin/Goody Powders/Naproxen/BC powders/Meloxicam/Diclofenac/Indomethacin and other Nonsteroidal anti-inflammatory medications as these will make you more likely to bleed and can cause stomach ulcers, can also cause Kidney problems.   2)Repeat BMP and Repeat CBC in 5 to 7 days   3)Please follow-up with hematologist/oncologist Dr. Derek Jack, MD due to Low Blood Count- -Address: inside [facility] ([place] [address] [place], [place], [place] [address] Phone: [phone]

## 2022-06-04 NOTE — ED Notes (Signed)
Patient transported to MRI 

## 2022-06-04 NOTE — Evaluation (Signed)
Occupational Therapy Evaluation Patient Details Name: CRISTOFER YAFFE MRN: [id number] DOB: 09-20-1928 Today's Date: 06/04/2022   History of Present Illness YADIEL AUBRY is a 89 y.o. male with medical history significant of multiple myeloma (follows with Dr. Delton Coombes) CAD s/p stent placement, hyperlipidemia, hypothyroidism, hypertension who presents to the emergency department from home via EMS due to altered mental status.  At bedside, patient was unable to provide details regarding why he came to the emergency department, he states that he believed that he felt dizzy last night and that he was fine at this time.  Most of the history was obtained from the ED physician and ED medical record.  Per report, patient suddenly became confused and unable to answer questions during dinner, he was awake and still speaking, but was confused.  EMS was activated, but by the time EMS team arrived, patient has been back to baseline and said that he did not remember what happened.  He denied headache, vision changes.  Patient was able to move all extremities without any weakness. (Per DO)   Clinical Impression   Pt agreeable to OT and PT co-evaluation. Pt reports feeling back to normal. Pt able to ambulate in room and hall using RW with supervision to mod I level of assist. Pt demonstrates WFL B UE strength and coordination. Pt appears to be at or near baseline levels for ADL's and basic mobility. Pt is not recommended for further acute OT services and will be discharged to care of nursing staff for remaining length of stay.       Recommendations for follow up therapy are one component of a multi-disciplinary discharge planning process, led by the attending physician.  Recommendations may be updated based on patient status, additional functional criteria and insurance authorization.   Follow Up Recommendations  No OT follow up     Assistance Recommended at Discharge PRN  Patient can return home with the  following      Functional Status Assessment  Patient has not had a recent decline in their functional status  Equipment Recommendations  None recommended by OT    Recommendations for Other Services       Precautions / Restrictions Precautions Precautions: Fall Restrictions Weight Bearing Restrictions: No      Mobility Bed Mobility Overal bed mobility: Modified Independent                  Transfers Overall transfer level: Needs assistance Equipment used: Rolling walker (2 wheels) Transfers: Sit to/from Stand, Bed to chair/wheelchair/BSC Sit to Stand: Supervision, Modified independent (Device/Increase time)     Step pivot transfers: Supervision, Modified independent (Device/Increase time)     General transfer comment: Pt able to ambulate to toilet and urinate standing with assist to manage IV pole.      Balance Overall balance assessment: Needs assistance Sitting-balance support: Feet supported, No upper extremity supported Sitting balance-Leahy Scale: Good Sitting balance - Comments: seated EOB   Standing balance support: During functional activity, Bilateral upper extremity supported Standing balance-Leahy Scale: Fair Standing balance comment: fair to good with RW                           ADL either performed or assessed with clinical judgement   ADL Overall ADL's : Modified independent  General ADL Comments: Supervision assist today for standing tasks largely due to managing IV.     Vision Baseline Vision/History: 1 Wears glasses Ability to See in Adequate Light: 1 Impaired Patient Visual Report: No change from baseline;Other (comment) (Pt has had something in L eye for a month.) Vision Assessment?: No apparent visual deficits                Pertinent Vitals/Pain Pain Assessment Pain Assessment: No/denies pain     Hand Dominance Right   Extremity/Trunk Assessment Upper  Extremity Assessment Upper Extremity Assessment: Overall WFL for tasks assessed   Lower Extremity Assessment Lower Extremity Assessment: Defer to PT evaluation   Cervical / Trunk Assessment Cervical / Trunk Assessment: Kyphotic   Communication Communication Communication: HOH;No difficulties   Cognition Arousal/Alertness: Awake/alert Behavior During Therapy: WFL for tasks assessed/performed Overall Cognitive Status: Within Functional Limits for tasks assessed                                                        Home Living Family/patient expects to be discharged to:: Private residence Living Arrangements: Spouse/significant other Available Help at Discharge: Family;Available PRN/intermittently Type of Home: House Home Access: Stairs to enter;Ramped entrance Entrance Stairs-Number of Steps: 3 Entrance Stairs-Rails: Right;Left;Can reach both Home Layout: One level     Bathroom Shower/Tub: Teacher, early years/pre: Handicapped height Bathroom Accessibility: Yes   Home Equipment: Conservation officer, nature (2 wheels)   Additional Comments: Home assist via person who comes 3 days a week for 3 hours.      Prior Functioning/Environment Prior Level of Function : Needs assist             Mobility Comments: household and short distanced community ambulator using RW. Pt drives. ADLs Comments: Independent ADL; assist IADL via person who comes 3 hours a day 3 days a week.                                Co-evaluation PT/OT/SLP Co-Evaluation/Treatment: Yes Reason for Co-Treatment: To address functional/ADL transfers   OT goals addressed during session: ADL's and self-care      AM-PAC OT "6 Clicks" Daily Activity     Outcome Measure Help from another person eating meals?: None Help from another person taking care of personal grooming?: None Help from another person toileting, which includes using toliet, bedpan, or urinal?: None Help from  another person bathing (including washing, rinsing, drying)?: None Help from another person to put on and taking off regular upper body clothing?: None Help from another person to put on and taking off regular lower body clothing?: None 6 Click Score: 24   End of Session Equipment Utilized During Treatment: Rolling walker (2 wheels)  Activity Tolerance: Patient tolerated treatment well Patient left: in bed;with call bell/phone within reach  OT Visit Diagnosis: Unsteadiness on feet (R26.81);Other abnormalities of gait and mobility (R26.89);Cognitive communication deficit (R41.841) Symptoms and signs involving cognitive functions:  (no acute infarct)                Time: 7017-7939 OT Time Calculation (min): 16 min Charges:  OT General Charges $OT Visit: 1 Visit OT Evaluation $OT Eval Low Complexity: 1 Low  Dequon Schnebly OT, MOT  Larey Seat 06/04/2022, 9:38  AM

## 2022-06-04 NOTE — Evaluation (Signed)
Physical Therapy Evaluation Patient Details Name: Dale [name] MRN: [id number] DOB: 12-Oct-1928 Today's Date: 06/04/2022  History of Present Illness  Dale [name] is a 89 y.o. male with medical history significant of multiple myeloma (follows with Dr. Delton Coombes) CAD s/p stent placement, hyperlipidemia, hypothyroidism, hypertension who presents to the emergency department from home via EMS due to altered mental status.  At bedside, patient was unable to provide details regarding why he came to the emergency department, he states that he believed that he felt dizzy last night and that he was fine at this time.  Most of the history was obtained from the ED physician and ED medical record.  Per report, patient suddenly became confused and unable to answer questions during dinner, he was awake and still speaking, but was confused.  EMS was activated, but by the time EMS team arrived, patient has been back to baseline and said that he did not remember what happened.  He denied headache, vision changes.  Patient was able to move all extremities without any weakness.   Clinical Impression  Patient functioning near baseline for functional mobility and gait demonstrating good return for transferring to/from chair at bedside, commode in bathroom, able to ambulate in room/hallway without loss of balance tolerated sitting up at bedside to eat breakfast after therapy.  Patient will benefit from continued skilled physical therapy in hospital and recommended venue below to increase strength, balance, endurance for safe ADLs and gait.         Recommendations for follow up therapy are one component of a multi-disciplinary discharge planning process, led by the attending physician.  Recommendations may be updated based on patient status, additional functional criteria and insurance authorization.  Follow Up Recommendations Home health PT      Assistance Recommended at Discharge Set up Supervision/Assistance   Patient can return home with the following  A little help with walking and/or transfers;A little help with bathing/dressing/bathroom;Help with stairs or ramp for entrance;Assistance with cooking/housework    Equipment Recommendations None recommended by PT  Recommendations for Other Services       Functional Status Assessment Patient has had a recent decline in their functional status and demonstrates the ability to make significant improvements in function in a reasonable and predictable amount of time.     Precautions / Restrictions Precautions Precautions: Fall Restrictions Weight Bearing Restrictions: No      Mobility  Bed Mobility Overal bed mobility: Modified Independent                  Transfers Overall transfer level: Needs assistance Equipment used: Rolling walker (2 wheels) Transfers: Sit to/from Stand, Bed to chair/wheelchair/BSC Sit to Stand: Supervision, Modified independent (Device/Increase time)   Step pivot transfers: Supervision, Modified independent (Device/Increase time)       General transfer comment: fair/good return for transferring to/from chair at bedside and to commode in bathroom with verbal cues for managing IV pole    Ambulation/Gait Ambulation/Gait assistance: Supervision, Modified independent (Device/Increase time) Gait Distance (Feet): 100 Feet Assistive device: Rolling walker (2 wheels) Gait Pattern/deviations: Decreased step length - right, Decreased step length - left, Decreased stride length Gait velocity: decreased     General Gait Details: slightly labored cadence without loss of balance with good return for ambulating in room/hallway without loss of balance  Stairs            Wheelchair Mobility    Modified Rankin (Stroke Patients Only)       Balance Overall balance  assessment: Needs assistance Sitting-balance support: Feet supported, No upper extremity supported Sitting balance-Leahy Scale: Good Sitting  balance - Comments: seated EOB   Standing balance support: During functional activity, Bilateral upper extremity supported Standing balance-Leahy Scale: Fair Standing balance comment: fair to good with RW                             Pertinent Vitals/Pain Pain Assessment Pain Assessment: No/denies pain    Home Living Family/patient expects to be discharged to:: Private residence Living Arrangements: Spouse/significant other Available Help at Discharge: Family;Available PRN/intermittently Type of Home: House Home Access: Stairs to enter;Ramped entrance Entrance Stairs-Rails: Right;Left;Can reach both Entrance Stairs-Number of Steps: 3   Home Layout: One level Home Equipment: Conservation officer, nature (2 wheels) Additional Comments: Home assist via person who comes 3 days a week for 3 hours.    Prior Function Prior Level of Function : Needs assist       Physical Assist : Mobility (physical);ADLs (physical)     Mobility Comments: household and short distanced community ambulator using RW. Pt drives. ADLs Comments: Independent ADL; assist IADL via person who comes 3 hours a day 3 days a week.     Hand Dominance   Dominant Hand: Right    Extremity/Trunk Assessment   Upper Extremity Assessment Upper Extremity Assessment: Defer to OT evaluation    Lower Extremity Assessment Lower Extremity Assessment: Generalized weakness    Cervical / Trunk Assessment Cervical / Trunk Assessment: Kyphotic  Communication   Communication: HOH;No difficulties  Cognition Arousal/Alertness: Awake/alert Behavior During Therapy: WFL for tasks assessed/performed Overall Cognitive Status: Within Functional Limits for tasks assessed                                          General Comments      Exercises     Assessment/Plan    PT Assessment Patient needs continued PT services  PT Problem List Decreased strength;Decreased activity tolerance;Decreased  balance;Decreased mobility       PT Treatment Interventions DME instruction;Gait training;Stair training;Functional mobility training;Therapeutic activities;Therapeutic exercise;Patient/family education;Balance training    PT Goals (Current goals can be found in the Care Plan section)  Acute Rehab PT Goals Patient Stated Goal: return home with family to assist PT Goal Formulation: With patient Time For Goal Achievement: 06/07/22 Potential to Achieve Goals: Good    Frequency Min 2X/week     Co-evaluation PT/OT/SLP Co-Evaluation/Treatment: Yes Reason for Co-Treatment: To address functional/ADL transfers PT goals addressed during session: Mobility/safety with mobility;Balance;Proper use of DME OT goals addressed during session: ADL's and self-care       AM-PAC PT "6 Clicks" Mobility  Outcome Measure Help needed turning from your back to your side while in a flat bed without using bedrails?: None Help needed moving from lying on your back to sitting on the side of a flat bed without using bedrails?: None Help needed moving to and from a bed to a chair (including a wheelchair)?: A Little Help needed standing up from a chair using your arms (e.g., wheelchair or bedside chair)?: None Help needed to walk in hospital room?: A Little Help needed climbing 3-5 steps with a railing? : A Little 6 Click Score: 21    End of Session   Activity Tolerance: Patient tolerated treatment well;Patient limited by fatigue Patient left: in bed;with call bell/phone within reach  Nurse Communication: Mobility status PT Visit Diagnosis: Unsteadiness on feet (R26.81);Other abnormalities of gait and mobility (R26.89);Muscle weakness (generalized) (M62.81)    Time: 6394-3200 PT Time Calculation (min) (ACUTE ONLY): 20 min   Charges:   PT Evaluation $PT Eval Moderate Complexity: 1 Mod PT Treatments $Therapeutic Activity: 8-22 mins        12:32 PM, 06/04/22 Lonell Grandchild, MPT Physical Therapist  with [facility] [address] [phone] office [phone] mobile phone

## 2022-06-04 NOTE — TOC Transition Note (Signed)
Transition of Care [facility]) - CM/SW Discharge Note   Patient Details  Name: Dale [name] MRN: [id number] Date of Birth: May 08, 1928  [facility] CM/SW Contact:  Salome Arnt, LCSW Phone Number: 06/04/2022, 12:46 PM   Clinical Narrative: Anticipate d/c later today. PT evaluated pt and recommend home health. Discussed with pt's wife who is agreeable with no preference on agency.  HHPT referred and accepted by Caryl Pina with [facility]. MD notified for order.     Final next level of care: [place] Barriers to Discharge: Barriers Resolved   Patient Goals and CMS Choice   Choice offered to / list presented to : Spouse  Discharge Placement                      Patient and family notified of of transfer: 06/04/22  Discharge Plan and Services Additional resources added to the After Visit Summary for                            [facility] Arranged: PT [place]: [place] ([place]) Date HH Agency Contacted: 06/04/22 Time [place]: [phone] Representative spoke with at [place]: [place] ([place]) Interventions SDOH Screenings   Food Insecurity: No Food Insecurity (06/04/2022)  Housing: Low Risk  (06/04/2022)  Transportation Needs: No Transportation Needs (06/04/2022)  Utilities: Not At Risk (06/04/2022)  Tobacco Use: Medium Risk (06/03/2022)     Readmission Risk Interventions    07/30/2021   10:16 AM  Readmission Risk Prevention Plan  Transportation Screening Complete  HRI or [place] Complete  Social Work Consult for [place] Planning/Counseling Complete  Palliative Care Screening Not Applicable  Medication Review Press photographer) Complete

## 2022-06-04 NOTE — Discharge Summary (Signed)
Dale [name], is a 89 y.o. male  DOB 11/08/1928  MRN [id number].  Admission date:  06/03/2022  Admitting Physician  Bernadette Hoit, DO  Discharge Date:  06/04/2022   Primary MD  Pcp, No  Recommendations for primary care physician for things to follow:   1)Avoid ibuprofen/Advil/Aleve/Motrin/Goody Powders/Naproxen/BC powders/Meloxicam/Diclofenac/Indomethacin and other Nonsteroidal anti-inflammatory medications as these will make you more likely to bleed and can cause stomach ulcers, can also cause Kidney problems.   2)Repeat BMP and Repeat CBC in 5 to 7 days   3)Please follow-up with hematologist/oncologist Dr. Derek Jack, MD due to Low Blood Count- -Address: inside [facility] [address], [place], [place] [address] Phone: [phone]  Admission Diagnosis  Altered mental status [R41.82] Altered mental status, unspecified altered mental status type [R41.82]   Discharge Diagnosis  Altered mental status [R41.82] Altered mental status, unspecified altered mental status type [R41.82]    Principal Problem:   Altered mental status Active Problems:   Thrombocytopenia ([place])   Essential hypertension   CAD, RCA stent, RCA new DES July 2009, Cath [place] 2011, 02/02/12   Multiple myeloma (HCC)   Mixed hyperlipidemia   Hypoalbuminemia due to protein-calorie malnutrition (HCC)   Hyponatremia   Macrocytic anemia      Past Medical History:  Diagnosis Date   Allergic rhinitis    Anal fissure    Anginal pain (HCC)    Asthma    Cancer ([place])    skin   Chest pain    CHF (congestive heart failure) ([place])    Coronary heart disease    s/p stenting. cath in 01/2012 noncritical occlusion   Dyspnea    Dysrhythmia    1st degree heart block   GERD (gastroesophageal reflux disease)    Glaucoma    Hiatal hernia    Hyperlipidemia    Hypertension    Hypothyroidism    Idiopathic  thrombocytopenic purpura ([place]) 2002   Macular degeneration    Multiple myeloma ([place]) 01/15/2021   Nephrolithiasis    PUD (peptic ulcer disease)    remote   Sarcoidosis    pulmonary   Schatzki's ring     Past Surgical History:  Procedure Laterality Date   cardiac stents     COLONOSCOPY  10/30/2006   Normal rectum, sigmoid diverticula.Remainder of colonic mucosa appeared normal.   CORONARY ANGIOPLASTY WITH STENT PLACEMENT     about 10 years ago per pt (around 2007)   [place], URETEROSCOPY AND STENT PLACEMENT Left 06/16/2017   Procedure: CYSTOSCOPY WITH RETROGRADE PYELOGRAM, URETEROSCOPY,STONE EXTRACTION  AND STENT PLACEMENT;  Surgeon: Franchot Gallo, MD;  Location: [facility];  Service: Urology;  Laterality: Left;   ESOPHAGOGASTRODUODENOSCOPY  06/19/2004   Two esophageal rings and esophageal web as described above.  All of these were disrupted by passing 56-French Venia Minks dilator/ Candida esophagitis,which appears to be incidental given history of   antibiotic use, but nevertheless will be treated.   ESOPHAGOGASTRODUODENOSCOPY  10/30/2006   Distal tandem esophageal ring status post  dilation disruption as  described above.  Otherwise normal esophagus/  Small hiatal hernia otherwise normal stomach, D1 and D2   ESOPHAGOGASTRODUODENOSCOPY N/A 03/22/2015   Dr.Rourk- noncritical schatzki's ring and hiatal hernia-o/w normal EGD.    ESOPHAGOGASTRODUODENOSCOPY (EGD) WITH ESOPHAGEAL DILATION  03/04/2012   RMR- schatzki's ring, hiatal hernia, polypoid gastric mucosa, bx= minimally active gastritis.   HOLMIUM LASER APPLICATION Left 5/00/9381   Procedure: HOLMIUM LASER APPLICATION;  Surgeon: Franchot Gallo, MD;  Location: [facility];  Service: Urology;  Laterality: Left;   IR CV LINE INJECTION  12/27/2021   IR GENERIC HISTORICAL  03/06/2016   IR RADIOLOGIST EVAL & MGMT 03/06/2016 Aletta Edouard, MD [facility] INTERV RAD   IR GENERIC HISTORICAL  06/18/2016   IR RADIOLOGIST EVAL  & MGMT 06/18/2016 Aletta Edouard, MD [facility] INTERV RAD   IR IMAGING GUIDED PORT INSERTION  12/18/2021   IR IMAGING GUIDED PORT INSERTION  01/15/2022   IR RADIOLOGIST EVAL & MGMT  10/01/2016   IR RADIOLOGIST EVAL & MGMT  10/15/2017   IR RADIOLOGIST EVAL & MGMT  12/24/2018   IR RADIOLOGIST EVAL & MGMT  01/04/2020   IR RADIOLOGIST EVAL & MGMT  06/20/2021   IR REMOVAL TUN ACCESS W/ PORT W/O FL MOD SED  01/15/2022   LEFT HEART CATH N/A 02/02/2012   Procedure: LEFT HEART CATH;  Surgeon: Lorretta Harp, MD;  Location: [facility] CATH LAB;  Service: Cardiovascular;  Laterality: N/A;   MEDIASTINOSCOPY     for dx sarcoid   RADIOLOGY WITH ANESTHESIA Left 05/17/2016   Procedure: left renal ablation;  Surgeon: Aletta Edouard, MD;  Location: [facility];  Service: Radiology;  Laterality: Left;     HPI  from the history and physical done on the day of admission:     HPI: Dale [name] is a 89 y.o. male with medical history significant of multiple myeloma (follows with Dr. Delton Coombes) CAD s/p stent placement, hyperlipidemia, hypothyroidism, hypertension who presents to the emergency department from home via EMS due to altered mental status.  At bedside, patient was unable to provide details regarding why he came to the emergency department, he states that he believed that he felt dizzy last night and that he was fine at this time.  Most of the history was obtained from the ED physician and ED medical record.  Per report, patient suddenly became confused and unable to answer questions during dinner, he was awake and still speaking, but was confused.  EMS was activated, but by the time EMS team arrived, patient has been back to baseline and said that he did not remember what happened.  He denied headache, vision changes.  Patient was able to move all extremities without any weakness.   ED Course:  In the emergency department, BP was 153/107, but other vital signs were within normal range.  Workup in the ED showed macrocytic anemia,  thrombocytopenia, BMP was normal except for sodium of 129 and blood glucose of 129.  Albumin 2.5.  Troponin x 2 was flat at 7. CT head without contrast showed no evidence of acute intracranial abnormality.  Small vessel ischemic changes. Hospitalist was asked to admit patient for further evaluation and management.   Review of Systems: Review of systems as noted in the HPI. All other systems reviewed and are negative.     Hospital Course:     Assessment and Plan:  1)Altered mental status--mentation appears to be back to baseline -Suspect this is multifactorial in part due to electrolyte derangement -On telemetry  no significant abnormalities noted -MRI brain and CT head without stroke or other acute intracranial findings -Carotid artery Dopplers without hemodynamically significant stenosis -Limited echo shows preserved EF of 60 to 65%, no regional wall motion normalities -Echo is without significant change from prior echo dated 02/07/2022 -Stroke and TIA less likely--- avoid DAPT given chronic anemia and thrombocytopenia -May give aspirin 81 mg daily for 30 days watch CBC closely, increased bleeding risk -A1c 6.3, troponin is not elevated -LDL is 48, HDL is 32 Continue PTA Lipitor  2) possible fungal colonization of the left maxillary sinus--- PCP to consider possible referral to ENT  3)Multiple myeloma with associated chronic anemia and chronic thrombocytopenia Patient on Velcade and daratumumab with last infusion being on 05/29/2022 -Patient has an appointment with Dr. Delton Coombes on 06/05/2022 -Should get repeat CBC at that time -No bleeding concerns -Continue B12 supplements  4) hypophosphatemia--may be related to ongoing chemotherapy -Replaced IV -Discharge home on oral  [employer] ph[employer] supplements   5) chronic hyponatremia--sodium currently around 130 which is close to patient's baseline  6)Generalized weakness and deconditioning--patient is almost 89 years old- -physical therapy  eval appreciated- -Home health PT recommended  7)HTN--- continue PTA metoprolol, losartan and isosorbide   8)Acquired hypothyroidism Continue Synthroid   9)CAD s/p stent placement-- --chest pain-free -Troponins are not elevated -EKG sinus without acute changes Continue Lipitor, metoprolol and aspirin    Discharge Condition: stable  Follow UP   Follow-up Information     [facility] Follow up.   Why: Will contact you to schedule home health visits.        Derek Jack, MD. Schedule an appointment as soon as possible for a visit.   Specialty: Hematology Why: Needs CBC Contact information: [place] [address] [phone]                 Diet and Activity recommendation:  As advised  Discharge Instructions    Discharge Instructions     Call MD for:  difficulty breathing, headache or visual disturbances   Complete by: As directed    Call MD for:  persistant dizziness or light-headedness   Complete by: As directed    Call MD for:  persistant nausea and vomiting   Complete by: As directed    Call MD for:  temperature >100.4   Complete by: As directed    Diet - low sodium heart healthy   Complete by: As directed    Discharge instructions   Complete by: As directed    1)Avoid ibuprofen/Advil/Aleve/Motrin/Goody Powders/Naproxen/BC powders/Meloxicam/Diclofenac/Indomethacin and other Nonsteroidal anti-inflammatory medications as these will make you more likely to bleed and can cause stomach ulcers, can also cause Kidney problems.   2)Repeat BMP and Repeat CBC in 5 to 7 days   3)Please follow-up with hematologist/oncologist Dr. Derek Jack, MD due to Low Blood Count- -Address: inside [facility] ([facility]th [facility]), [place], [place], [place] [address] Phone: [phone]   Increase activity slowly   Complete by: As directed          Discharge Medications     Allergies as of 06/04/2022       Reactions    Azithromycin Other (See Comments)   Sore mouth and fever blisters around mouth, sores in nose area as well   Doxazosin Shortness Of Breath   Atenolol Other (See Comments)   UNKNOWN REACTION   Hydrocodone Nausea And Vomiting   Levofloxacin Other (See Comments)   Caused stomach problems.  Morphine Other (See Comments)   "made me crazy"   Penicillins Nausea And Vomiting, Other (See Comments)   Has patient had a PCN reaction causing immediate rash, facial/tongue/throat swelling, SOB or lightheadedness with hypotension: No Has patient had a PCN reaction causing severe rash involving mucus membranes or skin necrosis: No Has patient had a PCN reaction that required hospitalization No Has patient had a PCN reaction occurring within the last 10 years: No If all of the above answers are "NO", then may proceed with Cephalosporin use.   Sulfonamide Derivatives Nausea And Vomiting        Medication List     STOP taking these medications    furosemide 20 MG tablet Commonly known as: LASIX   potassium chloride SA 20 MEQ tablet Commonly known as: KLOR-CON M       TAKE these medications    acetaminophen 325 MG tablet Commonly known as: TYLENOL Take 2 tablets (650 mg total) by mouth every 6 (six) hours as needed for mild pain (or Fever >/= 101).   acyclovir 400 MG tablet Commonly known as: ZOVIRAX TAKE 1 TABLET BY MOUTH TWICE  DAILY   aspirin EC 81 MG tablet Take 1 tablet (81 mg total) by mouth daily with breakfast. For 30 days Only   atorvastatin 40 MG tablet Commonly known as: LIPITOR TAKE 1 TABLET BY MOUTH IN  THE EVENING What changed: when to take this   brimonidine 0.2 % ophthalmic solution Commonly known as: ALPHAGAN Place 1 drop into the left eye 2 (two) times daily.   cyanocobalamin 1000 MCG tablet Take 1 tablet (1,000 mcg total) by mouth daily.   dexamethasone 2 MG tablet Commonly known as: DECADRON TAKE 5 TABLETS BY MOUTH ONCE  WEEKLY What changed: See the new  instructions.   dorzolamide-timolol 2-0.5 % ophthalmic solution Commonly known as: COSOPT Place 1 drop into the left eye 2 (two) times daily.   feeding supplement Liqd Take 237 mLs by mouth 3 (three) times daily between meals.   isosorbide mononitrate 60 MG 24 hr tablet Commonly known as: IMDUR Take 1 tablet (60 mg total) by mouth daily. TAKE 1 AND 1/2 TABLETS BY  MOUTH IN THE MORNING AND  1/2 TABLET AT NIGHT What changed: additional instructions   levothyroxine 75 MCG tablet Commonly known as: SYNTHROID Take 75 mcg by mouth daily before breakfast.   lidocaine-prilocaine cream Commonly known as: EMLA Apply 1 Application topically as needed.   losartan 100 MG tablet Commonly known as: COZAAR TAKE 1 TABLET BY MOUTH DAILY   metoprolol tartrate 25 MG tablet Commonly known as: LOPRESSOR TAKE 1 TABLET BY MOUTH IN  THE MORNING AND ONE-HALF  TABLET BY MOUTH IN THE  EVENING What changed:  how much to take how to take this when to take this   nitroGLYCERIN 0.4 MG SL tablet Commonly known as: NITROSTAT Place 1 tablet (0.4 mg total) under the tongue every 5 (five) minutes as needed. For chest pain   pantoprazole 40 MG tablet Commonly known as: PROTONIX TAKE 1 TABLET BY MOUTH DAILY  BEFORE BREAKFAST What changed: when to take this   phosphorus 155-852-130 MG tablet Commonly known as: K PHOS NEUTRAL Take 1 tablet (250 mg total) by mouth 2 (two) times daily for 7 days.   PreserVision/Lutein Caps Take 1 capsule by mouth 2 (two) times daily.   prochlorperazine 10 MG tablet Commonly known as: COMPAZINE Take 1 tablet (10 mg total) by mouth every 6 (six) hours as needed for nausea  or vomiting.   Rock[place]an 0.02-0.005 % Soln Generic drug: Netarsudil-Latanoprost Place 1 drop into the left eye at bedtime.   triamcinolone cream 0.1 % Commonly known as: KENALOG Apply 1 application. topically 2 (two) times daily as needed (for irritation).   VELCADE IJ Inject as directed once a  week.   VITAMIN C PO Take 500 mg by mouth every evening.   vitamin E 200 UNIT capsule Take 200 Units by mouth every evening.        Major procedures and Radiology Reports - PLEASE review detailed and final reports for all details, in brief -   ECHOCARDIOGRAM LIMITED  Result Date: 06/04/2022    ECHOCARDIOGRAM LIMITED REPORT   Patient Name:   PEDER ALLUMS Date of Exam: 06/04/2022 Medical Rec #:  [id number]      Height:       68.0 in Accession #:    [id number]     Weight:       125.0 lb Date of Birth:  1929/05/01      BSA:          1.674 m Patient Age:    36 years       BP:           192/86 mmHg Patient Gender: M              HR:           75 bpm. Exam Location:  [facility] Procedure: Limited Echo, Limited Color Doppler and Cardiac Doppler Indications:    TIA G45.9  History:        Patient has prior history of Echocardiogram examinations, most                 recent 02/07/2022. CAD, Signs/Symptoms:Altered Mental Status;                 Risk Factors:Dyslipidemia and Hypertension.  Sonographer:    Greer Pickerel Referring Phys: [id number] OLADAPO ADEFESO  Sonographer Comments: Image acquisition challenging due to patient body habitus. IMPRESSIONS  1. Limited Echo to evaluate for LVEF  2. Left ventricular ejection fraction, by estimation, is 60 to 65%. The left ventricle has normal function. The left ventricle has no regional wall motion abnormalities. Left ventricular diastolic function could not be evaluated. Comparison(s): No significant change from prior study. FINDINGS  Left Ventricle: Left ventricular ejection fraction, by estimation, is 60 to 65%. The left ventricle has normal function. The left ventricle has no regional wall motion abnormalities. The left ventricular internal cavity size was normal in size. There is  no left ventricular hypertrophy. Left ventricular diastolic function could not be evaluated. Right Ventricle: The right ventricular size is normal. Right vetricular wall thickness was not  assessed. Right ventricular systolic function was not well visualized. Left Atrium: Left atrial size was not assessed. Right Atrium: Right atrial size was not assessed. Pericardium: There is no evidence of pericardial effusion. Mitral Valve: The mitral valve is normal in structure. Trivial mitral valve regurgitation. No evidence of mitral valve stenosis. Tricuspid Valve: The tricuspid valve is normal in structure. Tricuspid valve regurgitation is not demonstrated. No evidence of tricuspid stenosis. Aortic Valve: The aortic valve is tricuspid. Aortic valve regurgitation is not visualized. No aortic stenosis is present. Pulmonic Valve: The pulmonic valve was not well visualized. Pulmonic valve regurgitation is not visualized. No evidence of pulmonic stenosis. Aorta: Aortic root could not be assessed. Venous: The inferior vena cava is normal in size with greater than 50% respiratory variability,  suggesting right atrial pressure of 3 mmHg. Additional Comments: Spectral Doppler performed. Color Doppler performed.  LEFT VENTRICLE PLAX 2D LVIDd:         4.20 cm LVIDs:         2.60 cm LV PW:         1.10 cm LV IVS:        1.00 cm  MITRAL VALVE               TRICUSPID VALVE MV Area (PHT): 3.85 cm    TR Peak grad:   12.4 mmHg MV Decel Time: 197 msec    TR Vmax:        176.00 cm/s MV E velocity: 82.60 cm/s MV A velocity: 82.00 cm/s MV E/A ratio:  1.01 Vishnu Priya Mallipeddi Electronically signed by Lorelee Cover Mallipeddi Signature Date/Time: 06/04/2022/1:36:43 PM    Final    US Carotid Bilateral  Result Date: 06/04/2022 CLINICAL DATA:  TIA symptoms EXAM: BILATERAL CAROTID DUPLEX ULTRASOUND TECHNIQUE: Pearline Cables scale imaging, color Doppler and duplex ultrasound were performed of bilateral carotid and vertebral arteries in the neck. COMPARISON:  None Available. FINDINGS: Criteria: Quantification of carotid stenosis is based on velocity parameters that correlate the residual internal carotid diameter with NASCET-based stenosis  levels, using the diameter of the distal internal carotid lumen as the denominator for stenosis measurement. The following velocity measurements were obtained: RIGHT ICA: 100/17 cm/sec CCA: 160/1 cm/sec SYSTOLIC ICA/CCA RATIO:  1.3 ECA: 99 cm/sec LEFT ICA: 100/21 cm/sec CCA: 09/3 cm/sec SYSTOLIC ICA/CCA RATIO:  1.3 ECA: 109 cm/sec RIGHT CAROTID ARTERY: Calcified bifurcation atherosclerosis. Negative for stenosis, velocity elevation, or turbulent flow. No significant waveform abnormality or stenosis by ultrasound. Degree of narrowing less than 50%. RIGHT VERTEBRAL ARTERY:  Normal antegrade flow LEFT CAROTID ARTERY: Similar calcified bifurcation at sclerosis. Also negative for significant stenosis, velocity elevation, or turbulent flow. Degree of narrowing also less than 50%. LEFT VERTEBRAL ARTERY:  Normal antegrade flow IMPRESSION: 1. Bilateral carotid atherosclerosis. Negative for significant stenosis by ultrasound. Degree of narrowing less than 50% bilaterally by ultrasound criteria. 2. Patent antegrade vertebral flow bilaterally. Electronically Signed   By: Jerilynn Mages.  Shick M.D.   On: 06/04/2022 12:10   MR BRAIN WO CONTRAST  Result Date: 06/04/2022 CLINICAL DATA:  Neuro deficit, acute, stroke suspected EXAM: MRI HEAD WITHOUT CONTRAST TECHNIQUE: Multiplanar, multiecho pulse sequences of the brain and surrounding structures were obtained without intravenous contrast. COMPARISON:  CT head 06/03/2022 FINDINGS: Brain: No acute infarction, hemorrhage, hydrocephalus, extra-axial collection or mass lesion. Mild for age scattered T2/FLAIR hyperintensities in the white matter, compatible with chronic microvascular ischemic disease. Vascular: Major arterial flow voids are maintained at the skull base. Skull and upper cervical spine: Normal marrow signal. Sinuses/Orbits: Near complete opacification of the left maxillary sinus with mucosal thickening and centrally T2 hypointense material which may represent fungal colonization.  Other: No mastoid effusions. IMPRESSION: 1. No evidence of acute intracranial abnormality. 2. Near complete opacification of the left maxillary sinus with mucosal thickening and centrally T2 hypointense material which may represent fungal colonization. Electronically Signed   By: Margaretha Sheffield M.D.   On: 06/04/2022 08:49   CT Head Wo Contrast  Result Date: 06/03/2022 CLINICAL DATA:  Altered mental status, confusion EXAM: CT HEAD WITHOUT CONTRAST TECHNIQUE: Contiguous axial images were obtained from the base of the skull through the vertex without intravenous contrast. RADIATION DOSE REDUCTION: This exam was performed according to the departmental dose-optimization program which includes automated exposure control, adjustment of the mA  and/or kV according to patient size and/or use of iterative reconstruction technique. COMPARISON:  01/16/2022 FINDINGS: Brain: No evidence of acute infarction, hemorrhage, hydrocephalus, extra-axial collection or mass lesion/mass effect. Subcortical white matter and periventricular small vessel ischemic changes. Vascular: Intracranial atherosclerosis. Skull: Normal. Negative for fracture or focal lesion. Sinuses/Orbits: Chronic expansile opacification of the left maxillary sinus. Visualized paranasal sinuses and mastoid air cells are otherwise clear. Other: None. IMPRESSION: No evidence of acute intracranial abnormality. Small vessel ischemic changes. Electronically Signed   By: Julian Hy M.D.   On: 06/03/2022 21:27    Today   Subjective    Edrick Kins today has no new complaints -Ambulated with physical therapist -I Called and updated patient's wife, questions answered          Patient has been seen and examined prior to discharge   Objective   Blood pressure (!) 154/80, pulse 85, temperature 98.3 F (36.8 C), temperature source Oral, resp. rate 16, height '5\' 8"'$  (1.727 m), weight 56.7 kg, SpO2 100 %.   Intake/Output Summary (Last 24 hours) at  06/04/2022 1816 Last data filed at 06/04/2022 1743 Gross per 24 hour  Intake 1170.01 ml  Output 200 ml  Net 970.01 ml    Exam Gen:- Awake Alert, no acute distress  HEENT:- [place].AT, No sclera icterus Neck-Supple Neck,No JVD,.  Lungs-  CTAB , good air movement bilaterally CV- S1, S2 normal, regular Abd-  +ve B.Sounds, Abd Soft, No tenderness,    Extremity/Skin:- No  edema,   good pulses Psych-affect is appropriate, oriented x3 Neuro-generalized weakness, no new focal deficits, no tremors    Data Review   CBC w Diff:  Lab Results  Component Value Date   WBC 4.6 06/03/2022   HGB 8.8 (L) 06/03/2022   HGB 11.3 (L) 10/26/2019   HCT 27.0 (L) 06/03/2022   HCT 33.5 (L) 10/26/2019   PLT 95 (L) 06/03/2022   PLT 196 10/26/2019   LYMPHOPCT 12 06/03/2022   BANDSPCT 4 01/16/2022   MONOPCT 13 06/03/2022   EOSPCT 2 06/03/2022   BASOPCT 0 06/03/2022    CMP:  Lab Results  Component Value Date   NA 129 (L) 06/03/2022   NA 139 10/26/2019   K 3.7 06/03/2022   CL 100 06/03/2022   CO2 22 06/03/2022   BUN 19 06/03/2022   BUN 25 10/26/2019   CREATININE 0.75 06/03/2022   PROT 8.1 06/03/2022   PROT 7.3 10/26/2019   ALBUMIN 2.5 (L) 06/03/2022   ALBUMIN 4.0 10/26/2019   BILITOT 0.4 06/03/2022   BILITOT 0.5 10/26/2019   ALKPHOS 59 06/03/2022   AST 20 06/03/2022   ALT 26 06/03/2022  .  Total Discharge time is about 33 minutes  Roxan Hockey M.D on 06/04/2022 at 6:16 PM  Go to [email or web address] -  for contact info  [facility] - Office  [phone]

## 2022-06-04 NOTE — Progress Notes (Signed)
  Echocardiogram 2D Echocardiogram has been performed.  Dale [name] 06/04/2022, 11:08 AM

## 2022-06-04 NOTE — Plan of Care (Signed)
  Problem: Acute Rehab PT Goals(only PT should resolve) Goal: Pt Will Go Supine/Side To Sit Outcome: Progressing Flowsheets (Taken 06/04/2022 1233) Pt will go Supine/Side to Sit: Independently Goal: Patient Will Transfer Sit To/From Stand Outcome: Progressing Flowsheets (Taken 06/04/2022 1233) Patient will transfer sit to/from stand: Independently Goal: Pt Will Transfer Bed To Chair/Chair To Bed Outcome: Progressing Flowsheets (Taken 06/04/2022 1233) Pt will Transfer Bed to Chair/Chair to Bed: with modified independence Goal: Pt Will Ambulate Outcome: Progressing Flowsheets (Taken 06/04/2022 1233) Pt will Ambulate:  > 125 feet  with modified independence  with rolling walker   12:33 PM, 06/04/22 Lonell Grandchild, MPT Physical Therapist with [facility] [phone] office [phone] mobile phone

## 2022-06-05 ENCOUNTER — Encounter: Payer: Self-pay | Admitting: Family Medicine

## 2022-06-05 ENCOUNTER — Inpatient Hospital Stay: Payer: Medicare Other

## 2022-06-05 ENCOUNTER — Inpatient Hospital Stay: Payer: Medicare Other | Admitting: Hematology

## 2022-06-05 DIAGNOSIS — Z5112 Encounter for antineoplastic immunotherapy: Secondary | ICD-10-CM | POA: Diagnosis not present

## 2022-06-05 DIAGNOSIS — C9000 Multiple myeloma not having achieved remission: Secondary | ICD-10-CM

## 2022-06-05 DIAGNOSIS — D630 Anemia in neoplastic disease: Secondary | ICD-10-CM | POA: Diagnosis not present

## 2022-06-05 DIAGNOSIS — Z79899 Other long term (current) drug therapy: Secondary | ICD-10-CM | POA: Diagnosis not present

## 2022-06-05 LAB — CBC WITH DIFFERENTIAL/PLATELET
Abs Immature Granulocytes: 0.03 10*3/uL (ref 0.00–0.07)
Basophils Absolute: 0 10*3/uL (ref 0.0–0.1)
Basophils Relative: 0 %
Eosinophils Absolute: 0.1 10*3/uL (ref 0.0–0.5)
Eosinophils Relative: 2 %
HCT: 29.7 % — ABNORMAL LOW (ref 39.0–52.0)
Hemoglobin: 9.8 g/dL — ABNORMAL LOW (ref 13.0–17.0)
Immature Granulocytes: 1 %
Lymphocytes Relative: 10 %
Lymphs Abs: 0.4 10*3/uL — ABNORMAL LOW (ref 0.7–4.0)
MCH: 33.6 pg (ref 26.0–34.0)
MCHC: 33 g/dL (ref 30.0–36.0)
MCV: 101.7 fL — ABNORMAL HIGH (ref 80.0–100.0)
Monocytes Absolute: 0.7 10*3/uL (ref 0.1–1.0)
Monocytes Relative: 16 %
Neutro Abs: 3.2 10*3/uL (ref 1.7–7.7)
Neutrophils Relative %: 71 %
Platelets: 104 10*3/uL — ABNORMAL LOW (ref 150–400)
RBC: 2.92 MIL/uL — ABNORMAL LOW (ref 4.22–5.81)
WBC: 4.5 10*3/uL (ref 4.0–10.5)
nRBC: 0 % (ref 0.0–0.2)

## 2022-06-05 LAB — SAMPLE TO BLOOD BANK

## 2022-06-05 MED ORDER — SODIUM CHLORIDE 0.9% FLUSH
10.0000 mL | INTRAVENOUS | Status: DC | PRN
  Administered 2022-06-05: 10 mL via INTRAVENOUS

## 2022-06-05 MED ORDER — HEPARIN SOD (PORK) LOCK FLUSH 100 UNIT/ML IV SOLN
500.0000 [IU] | Freq: Once | INTRAVENOUS | Status: AC
  Administered 2022-06-05: 500 [IU] via INTRAVENOUS

## 2022-06-05 NOTE — Progress Notes (Signed)
Dale [name] presented for Portacath access and flush. Portacath located left chest wall accessed with  H 20 needle. Good blood return present. Portacath flushed with 28m NS and 500U/525mHeparin and needle removed intact. Procedure without incident. Patient tolerated procedure well.  Blood work done and set up for blood transfusion on Friday per MD if needed.   Called patient friend to let him know he does not need blood this Friday. Hemoglobin 9.8. instructed friend to let him know to come. Friday for labs per Dr. EmDenton Brick

## 2022-06-06 DIAGNOSIS — K219 Gastro-esophageal reflux disease without esophagitis: Secondary | ICD-10-CM | POA: Diagnosis not present

## 2022-06-06 DIAGNOSIS — I110 Hypertensive heart disease with heart failure: Secondary | ICD-10-CM | POA: Diagnosis not present

## 2022-06-06 DIAGNOSIS — C9000 Multiple myeloma not having achieved remission: Secondary | ICD-10-CM | POA: Diagnosis not present

## 2022-06-06 DIAGNOSIS — H409 Unspecified glaucoma: Secondary | ICD-10-CM | POA: Diagnosis not present

## 2022-06-06 DIAGNOSIS — Z955 Presence of coronary angioplasty implant and graft: Secondary | ICD-10-CM | POA: Diagnosis not present

## 2022-06-06 DIAGNOSIS — Z8711 Personal history of peptic ulcer disease: Secondary | ICD-10-CM | POA: Diagnosis not present

## 2022-06-06 DIAGNOSIS — J45909 Unspecified asthma, uncomplicated: Secondary | ICD-10-CM | POA: Diagnosis not present

## 2022-06-06 DIAGNOSIS — E871 Hypo-osmolality and hyponatremia: Secondary | ICD-10-CM | POA: Diagnosis not present

## 2022-06-06 DIAGNOSIS — E782 Mixed hyperlipidemia: Secondary | ICD-10-CM | POA: Diagnosis not present

## 2022-06-06 DIAGNOSIS — Z7952 Long term (current) use of systemic steroids: Secondary | ICD-10-CM | POA: Diagnosis not present

## 2022-06-06 DIAGNOSIS — H3530 Unspecified macular degeneration: Secondary | ICD-10-CM | POA: Diagnosis not present

## 2022-06-06 DIAGNOSIS — E039 Hypothyroidism, unspecified: Secondary | ICD-10-CM | POA: Diagnosis not present

## 2022-06-06 DIAGNOSIS — K222 Esophageal obstruction: Secondary | ICD-10-CM | POA: Diagnosis not present

## 2022-06-06 DIAGNOSIS — D539 Nutritional anemia, unspecified: Secondary | ICD-10-CM | POA: Diagnosis not present

## 2022-06-06 DIAGNOSIS — D693 Immune thrombocytopenic purpura: Secondary | ICD-10-CM | POA: Diagnosis not present

## 2022-06-06 DIAGNOSIS — R634 Abnormal weight loss: Secondary | ICD-10-CM | POA: Diagnosis not present

## 2022-06-06 DIAGNOSIS — D860 Sarcoidosis of lung: Secondary | ICD-10-CM | POA: Diagnosis not present

## 2022-06-06 DIAGNOSIS — D630 Anemia in neoplastic disease: Secondary | ICD-10-CM | POA: Diagnosis not present

## 2022-06-06 DIAGNOSIS — I440 Atrioventricular block, first degree: Secondary | ICD-10-CM | POA: Diagnosis not present

## 2022-06-06 DIAGNOSIS — I509 Heart failure, unspecified: Secondary | ICD-10-CM | POA: Diagnosis not present

## 2022-06-06 DIAGNOSIS — E46 Unspecified protein-calorie malnutrition: Secondary | ICD-10-CM | POA: Diagnosis not present

## 2022-06-06 DIAGNOSIS — Z9181 History of falling: Secondary | ICD-10-CM | POA: Diagnosis not present

## 2022-06-06 DIAGNOSIS — I25119 Atherosclerotic heart disease of native coronary artery with unspecified angina pectoris: Secondary | ICD-10-CM | POA: Diagnosis not present

## 2022-06-06 DIAGNOSIS — E8809 Other disorders of plasma-protein metabolism, not elsewhere classified: Secondary | ICD-10-CM | POA: Diagnosis not present

## 2022-06-06 NOTE — Telephone Encounter (Signed)
Please advise 

## 2022-06-07 ENCOUNTER — Inpatient Hospital Stay: Payer: Medicare Other | Attending: Hematology

## 2022-06-07 ENCOUNTER — Other Ambulatory Visit: Payer: Self-pay

## 2022-06-07 DIAGNOSIS — D630 Anemia in neoplastic disease: Secondary | ICD-10-CM | POA: Diagnosis not present

## 2022-06-07 DIAGNOSIS — C9000 Multiple myeloma not having achieved remission: Secondary | ICD-10-CM | POA: Diagnosis not present

## 2022-06-07 DIAGNOSIS — Z79899 Other long term (current) drug therapy: Secondary | ICD-10-CM | POA: Insufficient documentation

## 2022-06-07 DIAGNOSIS — D6481 Anemia due to antineoplastic chemotherapy: Secondary | ICD-10-CM | POA: Diagnosis not present

## 2022-06-07 DIAGNOSIS — E559 Vitamin D deficiency, unspecified: Secondary | ICD-10-CM | POA: Insufficient documentation

## 2022-06-07 DIAGNOSIS — Z5112 Encounter for antineoplastic immunotherapy: Secondary | ICD-10-CM | POA: Diagnosis present

## 2022-06-07 LAB — RENAL FUNCTION PANEL
Albumin: 2.7 g/dL — ABNORMAL LOW (ref 3.5–5.0)
Anion gap: 8 (ref 5–15)
BUN: 22 mg/dL (ref 8–23)
CO2: 23 mmol/L (ref 22–32)
Calcium: 8.3 mg/dL — ABNORMAL LOW (ref 8.9–10.3)
Chloride: 101 mmol/L (ref 98–111)
Creatinine, Ser: 0.79 mg/dL (ref 0.61–1.24)
GFR, Estimated: >60 mL/min (ref >60)
Glucose, Bld: 122 mg/dL — ABNORMAL HIGH (ref 70–99)
Phosphorus: 2.3 mg/dL — ABNORMAL LOW (ref 2.5–4.6)
Potassium: 3.4 mmol/L — ABNORMAL LOW (ref 3.5–5.1)
Sodium: 132 mmol/L — ABNORMAL LOW (ref 135–145)

## 2022-06-07 LAB — PHOSPHORUS: Phosphorus: 2.3 mg/dL — ABNORMAL LOW (ref 2.5–4.6)

## 2022-06-07 MED ORDER — HEPARIN SOD (PORK) LOCK FLUSH 100 UNIT/ML IV SOLN
500.0000 [IU] | Freq: Once | INTRAVENOUS | Status: AC
  Administered 2022-06-07: 500 [IU] via INTRAVENOUS

## 2022-06-07 MED ORDER — SODIUM CHLORIDE 0.9% FLUSH
10.0000 mL | INTRAVENOUS | Status: DC | PRN
  Administered 2022-06-07: 10 mL via INTRAVENOUS

## 2022-06-07 MED ORDER — K PHOS MONO-SOD PHOS DI & MONO 155-852-130 MG PO TABS: 250.0000 mg | ORAL_TABLET | Freq: Two times a day (BID) | ORAL | 0 refills | Status: AC

## 2022-06-07 NOTE — Progress Notes (Signed)
Dale [name] presented for Portacath access and flush. Portacath located left chest wall accessed with  H 20 needle. Good blood return present. Portacath flushed with 34m NS and 500U/522mHeparin and needle removed intact. Procedure without incident. Patient tolerated procedure well.   Port flush with labs done today to recheck phosphorus. Called Dr. KaDelton Coombeshe result. 2.3. per MD will instruct patient to take 3 pills today and then continue taking 2 pills until Monday. Refill sent in per MD for phosphorus pills Patient is aware and understands.    Vitals stable and discharged home from clinic via wheelchair. Follow up as scheduled.

## 2022-06-07 NOTE — Patient Instructions (Signed)
[facility]  Discharge Instructions: Thank you for choosing [facility] to provide your oncology and hematology care.  If you have a lab appointment with the [facility], please come in thru the Main Entrance and check in at the main information desk.  Wear comfortable clothing and clothing appropriate for easy access to any Portacath or PICC line.   We strive to give you quality time with your provider. You may need to reschedule your appointment if you arrive late (15 or more minutes).  Arriving late affects you and other patients whose appointments are after yours.  Also, if you miss three or more appointments without notifying the office, you may be dismissed from the clinic at the provider's discretion.      For prescription refill requests, have your pharmacy contact our office and allow 72 hours for refills to be completed.    Take 3 phosphorus pill today and then take two until Monday when you come back for blood work.     To help prevent nausea and vomiting after your treatment, we encourage you to take your nausea medication as directed.  BELOW ARE SYMPTOMS THAT SHOULD BE REPORTED IMMEDIATELY: *FEVER GREATER THAN 100.4 F (38 C) OR HIGHER *CHILLS OR SWEATING *NAUSEA AND VOMITING THAT IS NOT CONTROLLED WITH YOUR NAUSEA MEDICATION *UNUSUAL SHORTNESS OF BREATH *UNUSUAL BRUISING OR BLEEDING *URINARY PROBLEMS (pain or burning when urinating, or frequent urination) *BOWEL PROBLEMS (unusual diarrhea, constipation, pain near the anus) TENDERNESS IN MOUTH AND THROAT WITH OR WITHOUT PRESENCE OF ULCERS (sore throat, sores in mouth, or a toothache) UNUSUAL RASH, SWELLING OR PAIN  UNUSUAL VAGINAL DISCHARGE OR ITCHING   Items with * indicate a potential emergency and should be followed up as soon as possible or go to the Emergency Department if any problems should occur.  Please show the CHEMOTHERAPY ALERT CARD or IMMUNOTHERAPY ALERT CARD at check-in to  the Emergency Department and triage nurse.  Should you have questions after your visit or need to cancel or reschedule your appointment, please contact [facility] [phone]  and follow the prompts.  Office hours are 8:00 a.m. to 4:30 p.m. Monday - Friday. Please note that voicemails left after 4:00 p.m. may not be returned until the following business day.  We are closed weekends and major holidays. You have access to a nurse at all times for urgent questions. Please call the main number to the clinic [phone] and follow the prompts.  For any non-urgent questions, you may also contact your provider using MyChart. We now offer e-Visits for anyone 16 and older to request care online for non-urgent symptoms. For details visit [email or web address].   Also download the MyChart app! Go to the app store, search "MyChart", open the app, select [facility], and log in with your MyChart username and password.

## 2022-06-07 NOTE — Addendum Note (Signed)
Addended by: Donnie Aho on: 06/07/2022 02:03 PM   Modules accepted: Orders

## 2022-06-10 ENCOUNTER — Inpatient Hospital Stay: Payer: Medicare Other

## 2022-06-10 ENCOUNTER — Other Ambulatory Visit: Payer: Self-pay

## 2022-06-10 VITALS — BP 143/59 | HR 72 | Temp 97.8°F | Resp 18

## 2022-06-10 DIAGNOSIS — D630 Anemia in neoplastic disease: Secondary | ICD-10-CM | POA: Diagnosis not present

## 2022-06-10 DIAGNOSIS — D6481 Anemia due to antineoplastic chemotherapy: Secondary | ICD-10-CM | POA: Diagnosis not present

## 2022-06-10 DIAGNOSIS — Z5112 Encounter for antineoplastic immunotherapy: Secondary | ICD-10-CM | POA: Diagnosis not present

## 2022-06-10 DIAGNOSIS — R790 Abnormal level of blood mineral: Secondary | ICD-10-CM

## 2022-06-10 DIAGNOSIS — C9000 Multiple myeloma not having achieved remission: Secondary | ICD-10-CM | POA: Diagnosis not present

## 2022-06-10 DIAGNOSIS — E559 Vitamin D deficiency, unspecified: Secondary | ICD-10-CM | POA: Diagnosis not present

## 2022-06-10 DIAGNOSIS — Z95828 Presence of other vascular implants and grafts: Secondary | ICD-10-CM

## 2022-06-10 LAB — PHOSPHORUS: Phosphorus: 2.8 mg/dL (ref 2.5–4.6)

## 2022-06-10 MED ORDER — HEPARIN SOD (PORK) LOCK FLUSH 100 UNIT/ML IV SOLN
500.0000 [IU] | Freq: Once | INTRAVENOUS | Status: AC
  Administered 2022-06-10: 500 [IU] via INTRAVENOUS

## 2022-06-10 MED ORDER — SODIUM CHLORIDE 0.9% FLUSH
10.0000 mL | Freq: Once | INTRAVENOUS | Status: AC
  Administered 2022-06-10: 10 mL via INTRAVENOUS

## 2022-06-10 NOTE — Progress Notes (Signed)
Patient presents today for phosphorus lab. Lab result 2.8, Dr. Delton Coombes made aware, advised patient to keep taking phosphorus as prescribed, repeat lab on Wednesday at appointment. Patient made aware.  Port flushed with good blood return noted. No bruising or swelling at site. Bandaid applied and patient discharged in satisfactory condition. VVS stable with no signs or symptoms of distressed noted

## 2022-06-10 NOTE — Patient Instructions (Signed)
[facility]  Discharge Instructions: Thank you for choosing [facility] to provide your oncology and hematology care.  If you have a lab appointment with the [facility], please come in thru the Main Entrance and check in at the main information desk.  Wear comfortable clothing and clothing appropriate for easy access to any Portacath or PICC line.   We strive to give you quality time with your provider. You may need to reschedule your appointment if you arrive late (15 or more minutes).  Arriving late affects you and other patients whose appointments are after yours.  Also, if you miss three or more appointments without notifying the office, you may be dismissed from the clinic at the provider's discretion.      For prescription refill requests, have your pharmacy contact our office and allow 72 hours for refills to be completed.    Today your phosphorus is 2.8, continue to take 1 tablet by mouth twice a day as prescribed. Return on Wed. Labs, doctors visit and possible treatment.   To help prevent nausea and vomiting after your treatment, we encourage you to take your nausea medication as directed.  BELOW ARE SYMPTOMS THAT SHOULD BE REPORTED IMMEDIATELY: *FEVER GREATER THAN 100.4 F (38 C) OR HIGHER *CHILLS OR SWEATING *NAUSEA AND VOMITING THAT IS NOT CONTROLLED WITH YOUR NAUSEA MEDICATION *UNUSUAL SHORTNESS OF BREATH *UNUSUAL BRUISING OR BLEEDING *URINARY PROBLEMS (pain or burning when urinating, or frequent urination) *BOWEL PROBLEMS (unusual diarrhea, constipation, pain near the anus) TENDERNESS IN MOUTH AND THROAT WITH OR WITHOUT PRESENCE OF ULCERS (sore throat, sores in mouth, or a toothache) UNUSUAL RASH, SWELLING OR PAIN  UNUSUAL VAGINAL DISCHARGE OR ITCHING   Items with * indicate a potential emergency and should be followed up as soon as possible or go to the Emergency Department if any problems should occur.  Please show the CHEMOTHERAPY  ALERT CARD or IMMUNOTHERAPY ALERT CARD at check-in to the Emergency Department and triage nurse.  Should you have questions after your visit or need to cancel or reschedule your appointment, please contact [facility] [phone]  and follow the prompts.  Office hours are 8:00 a.m. to 4:30 p.m. Monday - Friday. Please note that voicemails left after 4:00 p.m. may not be returned until the following business day.  We are closed weekends and major holidays. You have access to a nurse at all times for urgent questions. Please call the main number to the clinic [phone] and follow the prompts.  For any non-urgent questions, you may also contact your provider using MyChart. We now offer e-Visits for anyone 71 and older to request care online for non-urgent symptoms. For details visit [email or web address].   Also download the MyChart app! Go to the app store, search "MyChart", open the app, select [facility], and log in with your MyChart username and password.

## 2022-06-11 ENCOUNTER — Other Ambulatory Visit: Payer: Self-pay | Admitting: *Deleted

## 2022-06-11 DIAGNOSIS — I110 Hypertensive heart disease with heart failure: Secondary | ICD-10-CM | POA: Diagnosis not present

## 2022-06-11 DIAGNOSIS — E46 Unspecified protein-calorie malnutrition: Secondary | ICD-10-CM | POA: Diagnosis not present

## 2022-06-11 DIAGNOSIS — I509 Heart failure, unspecified: Secondary | ICD-10-CM | POA: Diagnosis not present

## 2022-06-11 DIAGNOSIS — C9000 Multiple myeloma not having achieved remission: Secondary | ICD-10-CM

## 2022-06-11 DIAGNOSIS — E871 Hypo-osmolality and hyponatremia: Secondary | ICD-10-CM | POA: Diagnosis not present

## 2022-06-11 NOTE — Progress Notes (Signed)
Patient's wife called today stating that they were getting food delivered from "somewhere" and that service has stopped.  They are without meals that they can easily prepare/cook and need some assistance.  She was asking if we could help connect them with any resources.  I have referred them to social work through the cancer center.

## 2022-06-12 ENCOUNTER — Inpatient Hospital Stay: Payer: Medicare Other

## 2022-06-12 ENCOUNTER — Other Ambulatory Visit: Payer: Self-pay

## 2022-06-12 ENCOUNTER — Inpatient Hospital Stay (HOSPITAL_BASED_OUTPATIENT_CLINIC_OR_DEPARTMENT_OTHER): Payer: Medicare Other | Admitting: Hematology

## 2022-06-12 VITALS — BP 164/70 | HR 79 | Temp 98.5°F | Resp 16

## 2022-06-12 VITALS — BP 147/60 | HR 65 | Temp 98.2°F | Resp 18 | Ht 68.0 in | Wt 124.6 lb

## 2022-06-12 DIAGNOSIS — C9000 Multiple myeloma not having achieved remission: Secondary | ICD-10-CM | POA: Diagnosis not present

## 2022-06-12 DIAGNOSIS — D630 Anemia in neoplastic disease: Secondary | ICD-10-CM | POA: Diagnosis not present

## 2022-06-12 DIAGNOSIS — Z5112 Encounter for antineoplastic immunotherapy: Secondary | ICD-10-CM | POA: Diagnosis not present

## 2022-06-12 DIAGNOSIS — E559 Vitamin D deficiency, unspecified: Secondary | ICD-10-CM | POA: Diagnosis not present

## 2022-06-12 DIAGNOSIS — Z95828 Presence of other vascular implants and grafts: Secondary | ICD-10-CM

## 2022-06-12 DIAGNOSIS — D6481 Anemia due to antineoplastic chemotherapy: Secondary | ICD-10-CM | POA: Diagnosis not present

## 2022-06-12 LAB — CBC WITH DIFFERENTIAL/PLATELET
Abs Immature Granulocytes: 0.03 10*3/uL (ref 0.00–0.07)
Basophils Absolute: 0 10*3/uL (ref 0.0–0.1)
Basophils Relative: 0 %
Eosinophils Absolute: 0.2 10*3/uL (ref 0.0–0.5)
Eosinophils Relative: 4 %
HCT: 25.3 % — ABNORMAL LOW (ref 39.0–52.0)
Hemoglobin: 8.2 g/dL — ABNORMAL LOW (ref 13.0–17.0)
Immature Granulocytes: 1 %
Lymphocytes Relative: 9 %
Lymphs Abs: 0.4 10*3/uL — ABNORMAL LOW (ref 0.7–4.0)
MCH: 33.9 pg (ref 26.0–34.0)
MCHC: 32.4 g/dL (ref 30.0–36.0)
MCV: 104.5 fL — ABNORMAL HIGH (ref 80.0–100.0)
Monocytes Absolute: 0.6 10*3/uL (ref 0.1–1.0)
Monocytes Relative: 12 %
Neutro Abs: 3.7 10*3/uL (ref 1.7–7.7)
Neutrophils Relative %: 74 %
Platelets: 87 10*3/uL — ABNORMAL LOW (ref 150–400)
RBC: 2.42 MIL/uL — ABNORMAL LOW (ref 4.22–5.81)
WBC: 4.9 10*3/uL (ref 4.0–10.5)
nRBC: 0 % (ref 0.0–0.2)

## 2022-06-12 LAB — COMPREHENSIVE METABOLIC PANEL
ALT: 23 U/L (ref 0–44)
AST: 17 U/L (ref 15–41)
Albumin: 2.5 g/dL — ABNORMAL LOW (ref 3.5–5.0)
Alkaline Phosphatase: 57 U/L (ref 38–126)
Anion gap: 5 (ref 5–15)
BUN: 20 mg/dL (ref 8–23)
CO2: 25 mmol/L (ref 22–32)
Calcium: 8.3 mg/dL — ABNORMAL LOW (ref 8.9–10.3)
Chloride: 99 mmol/L (ref 98–111)
Creatinine, Ser: 0.75 mg/dL (ref 0.61–1.24)
GFR, Estimated: >60 mL/min (ref >60)
Glucose, Bld: 98 mg/dL (ref 70–99)
Potassium: 3.7 mmol/L (ref 3.5–5.1)
Sodium: 129 mmol/L — ABNORMAL LOW (ref 135–145)
Total Bilirubin: 0.2 mg/dL — ABNORMAL LOW (ref 0.3–1.2)
Total Protein: 7.9 g/dL (ref 6.5–8.1)

## 2022-06-12 LAB — PHOSPHORUS: Phosphorus: <1 mg/dL — CL (ref 2.5–4.6)

## 2022-06-12 LAB — MAGNESIUM: Magnesium: 2.1 mg/dL (ref 1.7–2.4)

## 2022-06-12 LAB — SAMPLE TO BLOOD BANK

## 2022-06-12 MED ORDER — SODIUM CHLORIDE 0.9% FLUSH
10.0000 mL | Freq: Once | INTRAVENOUS | Status: AC
  Administered 2022-06-12: 10 mL via INTRAVENOUS

## 2022-06-12 MED ORDER — SODIUM PHOSPHATES 45 MMOLE/15ML IV SOLN
25.0000 mmol | Freq: Once | INTRAVENOUS | Status: AC
  Administered 2022-06-12: 25 mmol via INTRAVENOUS
  Filled 2022-06-12: qty 8.33

## 2022-06-12 MED ORDER — SODIUM CHLORIDE 0.9 % IV SOLN: INTRAVENOUS | Status: DC

## 2022-06-12 MED ORDER — PHOSPHO-TRIN K500 500 MG PO TABS: 500.0000 mg | ORAL_TABLET | Freq: Two times a day (BID) | ORAL | 1 refills | Status: DC

## 2022-06-12 MED ORDER — HEPARIN SOD (PORK) LOCK FLUSH 100 UNIT/ML IV SOLN
500.0000 [IU] | Freq: Once | INTRAVENOUS | Status: AC
  Administered 2022-06-12: 500 [IU] via INTRAVENOUS

## 2022-06-12 NOTE — Patient Instructions (Addendum)
[facility] Discharge Instructions   You were seen and examined today by Dr. Delton Coombes.  He reviewed the results of your lab work which are mostly normal/stable. Your phosphorus is extremely low at less than 1. We will give you IV phosphorus today. All other labs were normal/stable.  We will hold your treatment today. We will recheck your phosphorus on Friday. Continue taking the phosphorus pills as prescribed (one pill twice a day).   We will see you back in 1 week.    Thank you for choosing [facility] [place] at [facility] to provide your oncology and hematology care.  To afford each patient quality time with our provider, please arrive at least 15 minutes before your scheduled appointment time.   If you have a lab appointment with the [facility] please come in thru the Main Entrance and check in at the main information desk.  You need to re-schedule your appointment should you arrive 10 or more minutes late.  We strive to give you quality time with our providers, and arriving late affects you and other patients whose appointments are after yours.  Also, if you no show three or more times for appointments you may be dismissed from the clinic at the providers discretion.     Again, thank you for choosing [facility].  Our hope is that these requests will decrease the amount of time that you wait before being seen by our physicians.       _____________________________________________________________  Should you have questions after your visit to [facility], please contact our office at [phone] and follow the prompts.  Our office hours are 8:00 a.m. and 4:30 p.m. Monday - Friday.  Please note that voicemails left after 4:00 p.m. may not be returned until the following business day.  We are closed weekends and major holidays.  You do have access to a nurse 24-7, just call the main number to the clinic  [phone] and do not press any options, hold on the line and a nurse will answer the phone.    For prescription refill requests, have your pharmacy contact our office and allow 72 hours.    Due to Covid, you will need to wear a mask upon entering the hospital. If you do not have a mask, a mask will be given to you at the Main Entrance upon arrival. For doctor visits, patients may have 1 support person age 69 or older with them. For treatment visits, patients can not have anyone with them due to social distancing guidelines and our immunocompromised population.

## 2022-06-12 NOTE — Progress Notes (Signed)
[place], [place] [address]   CLINIC:  Medical Oncology/Hematology  Patient Care Team: Pcp, No as PCP - General Troy Sine, MD as PCP - Cardiology (Cardiology) Troy Sine, MD as Consulting Physician (Cardiology) Gala Romney Cristopher Estimable, MD as Consulting Physician (Gastroenterology) Derek Jack, MD as Medical Oncologist (Oncology)    REASON FOR VISIT:  Follow-up for multiple myeloma and anemia  PRIOR THERAPY: none  CURRENT THERAPY: Weekly Velcade and dexamethasone  BRIEF ONCOLOGIC HISTORY:  Oncology History  Multiple myeloma ([place])  01/15/2021 Initial Diagnosis   Multiple myeloma ([place])   06/25/2021 - 12/06/2021 Chemotherapy   Patient is on Treatment Plan : MYELOMA NON-TRANSPLANT CANDIDATES VRd weekly q21d     12/20/2021 -  Chemotherapy   Patient is on Treatment Plan : MYELOMA Daratumumab IV q28d       CANCER STAGING:  Cancer Staging  No matching staging information was found for the patient.  INTERVAL HISTORY:  Dale [name], a 89 y.o. male, seen for follow-up of multiple myeloma. He was last seen by me on 05/15/22. He is accompanied by his friend/caregiver today.  Patient has since been admitted to the hospital from 06/03/22 for AMS. While in the hospital he had an unremarkable CT head and MRI brain. His echo showed no significant changes. He had negative troponins x2. He was found to have macrocytic anemia and thrombocytopenia. His Hgb was 9.8, MCV was 101.7, and Platelets were 104K. His phosphorus was <1.0 in the hospital and improved to 2.3 on 06/07/22 after starting him on phosphorus supplementation and IV replacement.  His friend states that the patient's mental status has been much better since his hospitalization. His memory has returned to baseline. However, patient reports that he had an episode yesterday wherein his head felt "numb-like," but this has resolved.  Today, he states that he is doing well overall. His  appetite level is at 90%. His energy level is at 80%. He reports that he fluctuates between diarrhea and constipation. He reports having x1 episode of diarrhea in the last week. He took 2 antidiarrheal pills with resolution of symptoms. He denies any use of antacids.   He has been taking K Phos Neutral '250mg'$  BID since his hospitalization. His phosphorus was 2.8 on 06/10/22 but has dropped to <1.0 today. He denies missing any doses of his phosphorus supplements.   REVIEW OF SYSTEMS:  Review of Systems  Constitutional:  Negative for chills, fatigue and fever.  HENT:   Negative for lump/mass, mouth sores, nosebleeds, sore throat and trouble swallowing.   Respiratory:  Negative for cough and shortness of breath.   Cardiovascular:  Negative for chest pain, leg swelling and palpitations.  Gastrointestinal:  Positive for constipation and diarrhea. Negative for abdominal pain, nausea and vomiting.  Genitourinary:  Negative for bladder incontinence, difficulty urinating, dysuria, frequency, hematuria and nocturia.   Musculoskeletal:  Negative for arthralgias, back pain, flank pain, myalgias and neck pain.  Skin:  Negative for itching and rash.  Neurological:  Positive for headaches. Negative for dizziness and numbness.  Hematological:  Does not bruise/bleed easily.  Psychiatric/Behavioral:  Negative for depression, sleep disturbance and suicidal ideas. The patient is not nervous/anxious.   All other systems reviewed and are negative.   PAST MEDICAL/SURGICAL HISTORY:  Past Medical History:  Diagnosis Date   Allergic rhinitis    Anal fissure    Anginal pain ([place])    Asthma    Cancer ([place])  skin   Chest pain    CHF (congestive heart failure) ([place])    Coronary heart disease    s/p stenting. cath in 01/2012 noncritical occlusion   Dyspnea    Dysrhythmia    1st degree heart block   GERD (gastroesophageal reflux disease)    Glaucoma    Hiatal hernia    Hyperlipidemia    Hypertension     Hypothyroidism    Idiopathic thrombocytopenic purpura ([place]) 2002   Macular degeneration    Multiple myeloma ([place]) 01/15/2021   Nephrolithiasis    PUD (peptic ulcer disease)    remote   Sarcoidosis    pulmonary   Schatzki's ring    Past Surgical History:  Procedure Laterality Date   cardiac stents     COLONOSCOPY  10/30/2006   Normal rectum, sigmoid diverticula.Remainder of colonic mucosa appeared normal.   CORONARY ANGIOPLASTY WITH STENT PLACEMENT     about 10 years ago per pt (around 2007)   [place], URETEROSCOPY AND STENT PLACEMENT Left 06/16/2017   Procedure: CYSTOSCOPY WITH RETROGRADE PYELOGRAM, URETEROSCOPY,STONE EXTRACTION  AND STENT PLACEMENT;  Surgeon: Franchot Gallo, MD;  Location: [facility];  Service: Urology;  Laterality: Left;   ESOPHAGOGASTRODUODENOSCOPY  06/19/2004   Two esophageal rings and esophageal web as described above.  All of these were disrupted by passing 56-French Venia Minks dilator/ Candida esophagitis,which appears to be incidental given history of   antibiotic use, but nevertheless will be treated.   ESOPHAGOGASTRODUODENOSCOPY  10/30/2006   Distal tandem esophageal ring status post dilation disruption as  described above.  Otherwise normal esophagus/  Small hiatal hernia otherwise normal stomach, D1 and D2   ESOPHAGOGASTRODUODENOSCOPY N/A 03/22/2015   Dr.Rourk- noncritical schatzki's ring and hiatal hernia-o/w normal EGD.    ESOPHAGOGASTRODUODENOSCOPY (EGD) WITH ESOPHAGEAL DILATION  03/04/2012   RMR- schatzki's ring, hiatal hernia, polypoid gastric mucosa, bx= minimally active gastritis.   HOLMIUM LASER APPLICATION Left 4/33/2951   Procedure: HOLMIUM LASER APPLICATION;  Surgeon: Franchot Gallo, MD;  Location: [facility];  Service: Urology;  Laterality: Left;   IR CV LINE INJECTION  12/27/2021   IR GENERIC HISTORICAL  03/06/2016   IR RADIOLOGIST EVAL & MGMT 03/06/2016 Aletta Edouard, MD [facility] INTERV RAD   IR GENERIC HISTORICAL   06/18/2016   IR RADIOLOGIST EVAL & MGMT 06/18/2016 Aletta Edouard, MD [facility] INTERV RAD   IR IMAGING GUIDED PORT INSERTION  12/18/2021   IR IMAGING GUIDED PORT INSERTION  01/15/2022   IR RADIOLOGIST EVAL & MGMT  10/01/2016   IR RADIOLOGIST EVAL & MGMT  10/15/2017   IR RADIOLOGIST EVAL & MGMT  12/24/2018   IR RADIOLOGIST EVAL & MGMT  01/04/2020   IR RADIOLOGIST EVAL & MGMT  06/20/2021   IR REMOVAL TUN ACCESS W/ PORT W/O FL MOD SED  01/15/2022   LEFT HEART CATH N/A 02/02/2012   Procedure: LEFT HEART CATH;  Surgeon: Lorretta Harp, MD;  Location: [facility] CATH LAB;  Service: Cardiovascular;  Laterality: N/A;   MEDIASTINOSCOPY     for dx sarcoid   RADIOLOGY WITH ANESTHESIA Left 05/17/2016   Procedure: left renal ablation;  Surgeon: Aletta Edouard, MD;  Location: [facility];  Service: Radiology;  Laterality: Left;    SOCIAL HISTORY:  Social History   Socioeconomic History   Marital status: Married    Spouse name: Not on file   Number of children: 1   Years of education: Not on file   Highest education level: Not on file  Occupational History  Occupation: Retired    Comment: Cabin crew: RETIRED  Tobacco Use   Smoking status: Former    Packs/day: 0.10    Years: 2.00    Total pack years: 0.20    Types: Cigarettes, Cigars    Quit date: 05/06/1970    Years since quitting: 52.1   Smokeless tobacco: Never  Vaping Use   Vaping Use: Never used  Substance and Sexual Activity   Alcohol use: No    Alcohol/week: 0.0 standard drinks of alcohol   Drug use: No   Sexual activity: Never  Other Topics Concern   Not on file  Social History Narrative   Not on file   Social Determinants of Health   Financial Resource Strain: Not on file  Food Insecurity: No Food Insecurity (06/04/2022)   Hunger Vital Sign    Worried About Running Out of Food in the Last Year: Never true    Ran Out of Food in the Last Year: Never true  Transportation Needs: No Transportation Needs (06/04/2022)    PRAPARE - Hydrologist (Medical): No    Lack of Transportation (Non-Medical): No  Physical Activity: Not on file  Stress: Not on file  Social Connections: Not on file  Intimate Partner Violence: Not At Risk (06/04/2022)   Humiliation, Afraid, Rape, and Kick questionnaire    Fear of Current or Ex-Partner: No    Emotionally Abused: No    Physically Abused: No    Sexually Abused: No    FAMILY HISTORY:  Family History  Problem Relation Age of Onset   Heart disease Father        deceased age 69   Stroke Mother    Alzheimer's disease Mother    Heart attack Brother        deceased age 64   Cancer Other        niece   Colon cancer Neg Hx     CURRENT MEDICATIONS:  Current Outpatient Medications  Medication Sig Dispense Refill   acetaminophen (TYLENOL) 325 MG tablet Take 2 tablets (650 mg total) by mouth every 6 (six) hours as needed for mild pain (or Fever >/= 101).     acyclovir (ZOVIRAX) 400 MG tablet TAKE 1 TABLET BY MOUTH TWICE  DAILY 180 tablet 0   Ascorbic Acid (VITAMIN C PO) Take 500 mg by mouth every evening.     aspirin EC 81 MG tablet Take 1 tablet (81 mg total) by mouth daily with breakfast. For 30 days Only 30 tablet 0   atorvastatin (LIPITOR) 40 MG tablet TAKE 1 TABLET BY MOUTH IN  THE EVENING (Patient taking differently: Take 40 mg by mouth daily.) 90 tablet 3   Bortezomib (VELCADE IJ) Inject as directed once a week.     brimonidine (ALPHAGAN) 0.2 % ophthalmic solution Place 1 drop into the left eye 2 (two) times daily.     cyanocobalamin 1000 MCG tablet Take 1 tablet (1,000 mcg total) by mouth daily. 30 tablet 3   dexamethasone (DECADRON) 2 MG tablet TAKE 5 TABLETS BY MOUTH ONCE  WEEKLY (Patient taking differently: Take 10 mg by mouth once a week.) 60 tablet 0   dorzolamide-timolol (COSOPT) 22.3-6.8 MG/ML ophthalmic solution Place 1 drop into the left eye 2 (two) times daily.     feeding supplement (ENSURE ENLIVE / ENSURE PLUS) LIQD Take 237  mLs by mouth 3 (three) times daily between meals.     isosorbide mononitrate (IMDUR) 60  MG 24 hr tablet Take 1 tablet (60 mg total) by mouth daily. TAKE 1 AND 1/2 TABLETS BY  MOUTH IN THE MORNING AND  1/2 TABLET AT NIGHT (Patient taking differently: Take 60 mg by mouth daily. TAKE 1 TABLETS BY  MOUTH IN THE MORNING AND  1/2 TABLET AT NIGHT)     levothyroxine (SYNTHROID) 75 MCG tablet Take 75 mcg by mouth daily before breakfast.     lidocaine-prilocaine (EMLA) cream Apply 1 Application topically as needed. 30 g 1   losartan (COZAAR) 100 MG tablet TAKE 1 TABLET BY MOUTH DAILY (Patient taking differently: Take 100 mg by mouth daily.) 90 tablet 3   metoprolol tartrate (LOPRESSOR) 25 MG tablet TAKE 1 TABLET BY MOUTH IN  THE MORNING AND ONE-HALF  TABLET BY MOUTH IN THE  EVENING (Patient taking differently: Take 12.5-25 mg by mouth in the morning and at bedtime. TAKE 1 TABLET BY MOUTH IN  THE MORNING AND ONE-HALF  TABLET BY MOUTH IN THE  EVENING) 135 tablet 3   Multiple Vitamins-Minerals (PRESERVISION/LUTEIN) CAPS Take 1 capsule by mouth 2 (two) times daily.     nitroGLYCERIN (NITROSTAT) 0.4 MG SL tablet Place 1 tablet (0.4 mg total) under the tongue every 5 (five) minutes as needed. For chest pain 25 tablet 2   pantoprazole (PROTONIX) 40 MG tablet TAKE 1 TABLET BY MOUTH DAILY  BEFORE BREAKFAST (Patient taking differently: Take 40 mg by mouth daily.) 90 tablet 3   phosphorus (K PHOS NEUTRAL) 155-852-130 MG tablet Take 1 tablet (250 mg total) by mouth 2 (two) times daily for 7 days. 14 tablet 0   prochlorperazine (COMPAZINE) 10 MG tablet Take 1 tablet (10 mg total) by mouth every 6 (six) hours as needed for nausea or vomiting. 30 tablet 3   ROCKLATAN 0.02-0.005 % SOLN Place 1 drop into the left eye at bedtime.     triamcinolone (KENALOG) 0.1 % cream Apply 1 application. topically 2 (two) times daily as needed (for irritation).     vitamin E 200 UNIT capsule Take 200 Units by mouth every evening.     No  current facility-administered medications for this visit.   Facility-Administered Medications Ordered in Other Visits  Medication Dose Route Frequency Provider Last Rate Last Admin   acetaminophen (TYLENOL) 325 MG tablet            diphenhydrAMINE (BENADRYL) 25 mg capsule            montelukast (SINGULAIR) 10 MG tablet             ALLERGIES:  Allergies  Allergen Reactions   Azithromycin Other (See Comments)    Sore mouth and fever blisters around mouth, sores in nose area as well   Doxazosin Shortness Of Breath   Atenolol Other (See Comments)    UNKNOWN REACTION   Hydrocodone Nausea And Vomiting   Levofloxacin Other (See Comments)    Caused stomach problems.   Morphine Other (See Comments)    "made me crazy"   Penicillins Nausea And Vomiting and Other (See Comments)    Has patient had a PCN reaction causing immediate rash, facial/tongue/throat swelling, SOB or lightheadedness with hypotension: No Has patient had a PCN reaction causing severe rash involving mucus membranes or skin necrosis: No Has patient had a PCN reaction that required hospitalization No Has patient had a PCN reaction occurring within the last 10 years: No If all of the above answers are "NO", then may proceed with Cephalosporin use.    Sulfonamide Derivatives  Nausea And Vomiting    PHYSICAL EXAM:  Performance status (ECOG): 0 - Asymptomatic  There were no vitals filed for this visit.  Wt Readings from Last 3 Encounters:  06/12/22 56.5 kg (124 lb 9.6 oz)  06/03/22 56.7 kg (125 lb)  05/29/22 57 kg (125 lb 9.6 oz)   Physical Exam Vitals and nursing note reviewed. Exam conducted with a chaperone present.  Constitutional:      Appearance: Normal appearance.  Cardiovascular:     Rate and Rhythm: Normal rate and regular rhythm.     Pulses: Normal pulses.     Heart sounds: Normal heart sounds.  Pulmonary:     Effort: Pulmonary effort is normal.     Breath sounds: Normal breath sounds.  Abdominal:      Palpations: Abdomen is soft. There is no hepatomegaly, splenomegaly or mass.     Tenderness: There is no abdominal tenderness.  Lymphadenopathy:     Cervical: No cervical adenopathy.     Right cervical: No superficial cervical adenopathy.    Left cervical: No superficial cervical adenopathy.  Neurological:     General: No focal deficit present.     Mental Status: He is alert and oriented to person, place, and time.  Psychiatric:        Mood and Affect: Mood normal.        Behavior: Behavior normal.     LABORATORY DATA:  I have reviewed the labs as listed.     Latest Ref Rng & Units 06/12/2022    7:56 AM 06/05/2022    9:31 AM 06/03/2022    8:27 PM  CBC  WBC 4.0 - 10.5 K/uL 4.9  4.5  4.6   Hemoglobin 13.0 - 17.0 g/dL 8.2  9.8  8.8   Hematocrit 39.0 - 52.0 % 25.3  29.7  27.0   Platelets 150 - 400 K/uL 87  104  95       Latest Ref Rng & Units 06/12/2022    7:56 AM 06/07/2022    8:37 AM 06/03/2022    8:27 PM  CMP  Glucose 70 - 99 mg/dL 98  122  129   BUN 8 - 23 mg/dL '20  22  19   '$ Creatinine 0.61 - 1.24 mg/dL 0.75  0.79  0.75   Sodium 135 - 145 mmol/L 129  132  129   Potassium 3.5 - 5.1 mmol/L 3.7  3.4  3.7   Chloride 98 - 111 mmol/L 99  101  100   CO2 22 - 32 mmol/L '25  23  22   '$ Calcium 8.9 - 10.3 mg/dL 8.3  8.3  8.3   Total Protein 6.5 - 8.1 g/dL 7.9   8.1   Total Bilirubin 0.3 - 1.2 mg/dL 0.2   0.4   Alkaline Phos 38 - 126 U/L 57   59   AST 15 - 41 U/L 17   20   ALT 0 - 44 U/L 23   26    Component     Latest Ref Rng 06/04/2022 06/07/2022 06/10/2022 06/12/2022  Phosphorus     2.5 - 4.6 mg/dL <1.0 (LL)  2.3 (L)  2.8  <1.0 (LL)   Phosphorus       2.3 (L)        DIAGNOSTIC IMAGING:  I have independently reviewed the scans and discussed with the patient. ECHOCARDIOGRAM LIMITED  Result Date: 06/04/2022    ECHOCARDIOGRAM LIMITED REPORT   Patient Name:   Dale [name] Date of  Exam: 06/04/2022 Medical Rec #:  [id number]      Height:       68.0 in Accession #:    [id number]     Weight:        125.0 lb Date of Birth:  October 25, 1928      BSA:          1.674 m Patient Age:    89 years       BP:           192/86 mmHg Patient Gender: M              HR:           75 bpm. Exam Location:  [facility] Procedure: Limited Echo, Limited Color Doppler and Cardiac Doppler Indications:    TIA G45.9  History:        Patient has prior history of Echocardiogram examinations, most                 recent 02/07/2022. CAD, Signs/Symptoms:Altered Mental Status;                 Risk Factors:Dyslipidemia and Hypertension.  Sonographer:    Greer Pickerel Referring Phys: [id number] OLADAPO ADEFESO  Sonographer Comments: Image acquisition challenging due to patient body habitus. IMPRESSIONS  1. Limited Echo to evaluate for LVEF  2. Left ventricular ejection fraction, by estimation, is 60 to 65%. The left ventricle has normal function. The left ventricle has no regional wall motion abnormalities. Left ventricular diastolic function could not be evaluated. Comparison(s): No significant change from prior study. FINDINGS  Left Ventricle: Left ventricular ejection fraction, by estimation, is 60 to 65%. The left ventricle has normal function. The left ventricle has no regional wall motion abnormalities. The left ventricular internal cavity size was normal in size. There is  no left ventricular hypertrophy. Left ventricular diastolic function could not be evaluated. Right Ventricle: The right ventricular size is normal. Right vetricular wall thickness was not assessed. Right ventricular systolic function was not well visualized. Left Atrium: Left atrial size was not assessed. Right Atrium: Right atrial size was not assessed. Pericardium: There is no evidence of pericardial effusion. Mitral Valve: The mitral valve is normal in structure. Trivial mitral valve regurgitation. No evidence of mitral valve stenosis. Tricuspid Valve: The tricuspid valve is normal in structure. Tricuspid valve regurgitation is not demonstrated. No evidence of tricuspid  stenosis. Aortic Valve: The aortic valve is tricuspid. Aortic valve regurgitation is not visualized. No aortic stenosis is present. Pulmonic Valve: The pulmonic valve was not well visualized. Pulmonic valve regurgitation is not visualized. No evidence of pulmonic stenosis. Aorta: Aortic root could not be assessed. Venous: The inferior vena cava is normal in size with greater than 50% respiratory variability, suggesting right atrial pressure of 3 mmHg. Additional Comments: Spectral Doppler performed. Color Doppler performed.  LEFT VENTRICLE PLAX 2D LVIDd:         4.20 cm LVIDs:         2.60 cm LV PW:         1.10 cm LV IVS:        1.00 cm  MITRAL VALVE               TRICUSPID VALVE MV Area (PHT): 3.85 cm    TR Peak grad:   12.4 mmHg MV Decel Time: 197 msec    TR Vmax:        176.00 cm/s MV E velocity: 82.60 cm/s MV A velocity: 82.00 cm/s  MV E/A ratio:  1.01 Vishnu Priya Mallipeddi Electronically signed by Lorelee Cover Mallipeddi Signature Date/Time: 06/04/2022/1:36:43 PM    Final    US Carotid Bilateral  Result Date: 06/04/2022 CLINICAL DATA:  TIA symptoms EXAM: BILATERAL CAROTID DUPLEX ULTRASOUND TECHNIQUE: Pearline Cables scale imaging, color Doppler and duplex ultrasound were performed of bilateral carotid and vertebral arteries in the neck. COMPARISON:  None Available. FINDINGS: Criteria: Quantification of carotid stenosis is based on velocity parameters that correlate the residual internal carotid diameter with NASCET-based stenosis levels, using the diameter of the distal internal carotid lumen as the denominator for stenosis measurement. The following velocity measurements were obtained: RIGHT ICA: 100/17 cm/sec CCA: 235/5 cm/sec SYSTOLIC ICA/CCA RATIO:  1.3 ECA: 99 cm/sec LEFT ICA: 100/21 cm/sec CCA: 73/2 cm/sec SYSTOLIC ICA/CCA RATIO:  1.3 ECA: 109 cm/sec RIGHT CAROTID ARTERY: Calcified bifurcation atherosclerosis. Negative for stenosis, velocity elevation, or turbulent flow. No significant waveform abnormality or  stenosis by ultrasound. Degree of narrowing less than 50%. RIGHT VERTEBRAL ARTERY:  Normal antegrade flow LEFT CAROTID ARTERY: Similar calcified bifurcation at sclerosis. Also negative for significant stenosis, velocity elevation, or turbulent flow. Degree of narrowing also less than 50%. LEFT VERTEBRAL ARTERY:  Normal antegrade flow IMPRESSION: 1. Bilateral carotid atherosclerosis. Negative for significant stenosis by ultrasound. Degree of narrowing less than 50% bilaterally by ultrasound criteria. 2. Patent antegrade vertebral flow bilaterally. Electronically Signed   By: Jerilynn Mages.  Shick M.D.   On: 06/04/2022 12:10   MR BRAIN WO CONTRAST  Result Date: 06/04/2022 CLINICAL DATA:  Neuro deficit, acute, stroke suspected EXAM: MRI HEAD WITHOUT CONTRAST TECHNIQUE: Multiplanar, multiecho pulse sequences of the brain and surrounding structures were obtained without intravenous contrast. COMPARISON:  CT head 06/03/2022 FINDINGS: Brain: No acute infarction, hemorrhage, hydrocephalus, extra-axial collection or mass lesion. Mild for age scattered T2/FLAIR hyperintensities in the white matter, compatible with chronic microvascular ischemic disease. Vascular: Major arterial flow voids are maintained at the skull base. Skull and upper cervical spine: Normal marrow signal. Sinuses/Orbits: Near complete opacification of the left maxillary sinus with mucosal thickening and centrally T2 hypointense material which may represent fungal colonization. Other: No mastoid effusions. IMPRESSION: 1. No evidence of acute intracranial abnormality. 2. Near complete opacification of the left maxillary sinus with mucosal thickening and centrally T2 hypointense material which may represent fungal colonization. Electronically Signed   By: Margaretha Sheffield M.D.   On: 06/04/2022 08:49   CT Head Wo Contrast  Result Date: 06/03/2022 CLINICAL DATA:  Altered mental status, confusion EXAM: CT HEAD WITHOUT CONTRAST TECHNIQUE: Contiguous axial images  were obtained from the base of the skull through the vertex without intravenous contrast. RADIATION DOSE REDUCTION: This exam was performed according to the departmental dose-optimization program which includes automated exposure control, adjustment of the mA and/or kV according to patient size and/or use of iterative reconstruction technique. COMPARISON:  01/16/2022 FINDINGS: Brain: No evidence of acute infarction, hemorrhage, hydrocephalus, extra-axial collection or mass lesion/mass effect. Subcortical white matter and periventricular small vessel ischemic changes. Vascular: Intracranial atherosclerosis. Skull: Normal. Negative for fracture or focal lesion. Sinuses/Orbits: Chronic expansile opacification of the left maxillary sinus. Visualized paranasal sinuses and mastoid air cells are otherwise clear. Other: None. IMPRESSION: No evidence of acute intracranial abnormality. Small vessel ischemic changes. Electronically Signed   By: Julian Hy M.D.   On: 06/03/2022 21:27     ASSESSMENT:  1.  IgG lambda plasma cell myeloma: - Work-up for macrocytic anemia on 12/01/2020 showed M spike of 2.9 g.  Immunofixation IgG lambda. - Lambda  light chains elevated at 284.  Light chain ratio was 0.04.  LDH was 163.  Creatinine was 0.9 and calcium 8.9. - Bone marrow biopsy on 12/18/2020-hypercellular marrow for age with 48% atypical plasma cells with lambda light chain restriction. - Chromosome analysis was normal. 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 - Revlimid 5 mg, 2 weeks on/1 week off started on 01/20/2021.  Dexamethasone weekly 10 mg added on 02/07/2021.  Revlimid is discontinued around 06/15/2021 due to poor tolerance and ineffectiveness at low-dose.  Thrombocytopenia and anemia. - Velcade weekly on days 1, 8, 15 every 21 days along with dexamethasone 10 mg weekly started on 06/25/2021.  2.  Macrocytic anemia: - CBC on  12/01/2020 with hemoglobin 8.5 and MCV of 117. - Denies any bleeding per rectum or melena.  3.  Social/family history: - Lives at home with his wife.  He does all ADLs and IADLs.  He even does yard work. - He worked at [employer] for 35 years.  Denies any chemical exposure.  Non-smoker. - No family history of malignancies.   PLAN:  1.  IgG lambda plasma cell myeloma, high risk: - He was receiving Velcade and Darzalex.  However he was admitted to the hospital recently with confusion. - We have sent myeloma panel today. - I have reviewed labs today which showed normal LFTs and creatinine.  CBC shows normal white count and platelet count at 87. - Recommend holding treatment today due to his recent admission.  I will plan to see him back in 1 week for follow-up and discuss myeloma labs.   2.  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 - Hemoglobin today is 8.2.  Transfuse to keep hemoglobin above 8.  3.  Hypophosphatemia: - He was found to be hypophosphatemic in the hospital.  He received IV therapy followed by K-Phos to 50 mg twice daily.  He reports that he is not missing doses.  However today his phosphate was less than 1. - Will give him 25 mmol (0.4 mmol/kg) IV over 6 hours. - Will increase K-Phos to 500 mg twice daily. - Will repeat phosphate level on 06/14/2022.  We will check vitamin D and PTH levels.  He has on and off diarrhea but not consistently.  4.  Right ankle swelling: - Continue Lasix as needed.  5.  ID prophylaxis: - Continue acyclovir twice daily.  Continue aspirin for thromboprophylaxis.    Orders placed this encounter:  Orders Placed This Encounter  Procedures   VITAMIN D 25 Hydroxy (Vit-D Deficiency, Fractures)   PTH, intact and calcium     I,Alexis Herring,acting as a scribe for Derek Jack, MD.,have documented all relevant documentation on the behalf of Derek Jack, MD,as directed by   Derek Jack, MD while in the presence of Derek Jack, MD.  I, Derek Jack MD, have reviewed the above documentation for accuracy and completeness, and I agree with the above.   Derek Jack, MD [facility] [phone]

## 2022-06-12 NOTE — Patient Instructions (Signed)
[facility]  Discharge Instructions: Thank you for choosing [facility] to provide your oncology and hematology care.  If you have a lab appointment with the [facility], please come in thru the Main Entrance and check in at the main information desk.  Wear comfortable clothing and clothing appropriate for easy access to any Portacath or PICC line.   We strive to give you quality time with your provider. You may need to reschedule your appointment if you arrive late (15 or more minutes).  Arriving late affects you and other patients whose appointments are after yours.  Also, if you miss three or more appointments without notifying the office, you may be dismissed from the clinic at the provider's discretion.      For prescription refill requests, have your pharmacy contact our office and allow 72 hours for refills to be completed.    Today you received the following, IV phosphorus    To help prevent nausea and vomiting after your treatment, we encourage you to take your nausea medication as directed.  BELOW ARE SYMPTOMS THAT SHOULD BE REPORTED IMMEDIATELY: *FEVER GREATER THAN 100.4 F (38 C) OR HIGHER *CHILLS OR SWEATING *NAUSEA AND VOMITING THAT IS NOT CONTROLLED WITH YOUR NAUSEA MEDICATION *UNUSUAL SHORTNESS OF BREATH *UNUSUAL BRUISING OR BLEEDING *URINARY PROBLEMS (pain or burning when urinating, or frequent urination) *BOWEL PROBLEMS (unusual diarrhea, constipation, pain near the anus) TENDERNESS IN MOUTH AND THROAT WITH OR WITHOUT PRESENCE OF ULCERS (sore throat, sores in mouth, or a toothache) UNUSUAL RASH, SWELLING OR PAIN  UNUSUAL VAGINAL DISCHARGE OR ITCHING   Items with * indicate a potential emergency and should be followed up as soon as possible or go to the Emergency Department if any problems should occur.  Please show the CHEMOTHERAPY ALERT CARD or IMMUNOTHERAPY ALERT CARD at check-in to the Emergency Department and triage  nurse.  Should you have questions after your visit or need to cancel or reschedule your appointment, please contact [facility] [phone]  and follow the prompts.  Office hours are 8:00 a.m. to 4:30 p.m. Monday - Friday. Please note that voicemails left after 4:00 p.m. may not be returned until the following business day.  We are closed weekends and major holidays. You have access to a nurse at all times for urgent questions. Please call the main number to the clinic [phone] and follow the prompts.  For any non-urgent questions, you may also contact your provider using MyChart. We now offer e-Visits for anyone 36 and older to request care online for non-urgent symptoms. For details visit [email or web address].   Also download the MyChart app! Go to the app store, search "MyChart", open the app, select [facility], and log in with your MyChart username and password.

## 2022-06-12 NOTE — Progress Notes (Signed)
CRITICAL VALUE ALERT Critical value received:  Phosphorus <1.0 . Date of notification:  06-12-2022 Time of notification: [phone] Critical value read back:  Yes.   Nurse who received alert:  B.Marianne Golightly RN.  MD notified time and response:  Dr. Delton Coombes @ [phone]. Patient has appointment with Dr. Claudina Lick today. Patient taking phosphorus (K PHOS NEUTRAL '250mg'$  total) by mouth BID.

## 2022-06-12 NOTE — Progress Notes (Signed)
0915  labs reviewed with MD office visit today, no treatment per MD, will give phosphorus per orders.    Patient tolerated it well without problems. Vitals stable and discharged home from clinic ambulatory. Follow up as scheduled.

## 2022-06-13 DIAGNOSIS — E46 Unspecified protein-calorie malnutrition: Secondary | ICD-10-CM | POA: Diagnosis not present

## 2022-06-13 DIAGNOSIS — E871 Hypo-osmolality and hyponatremia: Secondary | ICD-10-CM | POA: Diagnosis not present

## 2022-06-13 DIAGNOSIS — C9000 Multiple myeloma not having achieved remission: Secondary | ICD-10-CM | POA: Diagnosis not present

## 2022-06-13 DIAGNOSIS — I509 Heart failure, unspecified: Secondary | ICD-10-CM | POA: Diagnosis not present

## 2022-06-13 DIAGNOSIS — I110 Hypertensive heart disease with heart failure: Secondary | ICD-10-CM | POA: Diagnosis not present

## 2022-06-13 LAB — MISC LABCORP TEST (SEND OUT): Labcorp test code: 81950

## 2022-06-13 LAB — KAPPA/LAMBDA LIGHT CHAINS
Kappa free light chain: 4.3 mg/L (ref 3.3–19.4)
Kappa, lambda light chain ratio: 0.01 — ABNORMAL LOW (ref 0.26–1.65)
Lambda free light chains: 528.6 mg/L — ABNORMAL HIGH (ref 5.7–26.3)

## 2022-06-13 LAB — PTH, INTACT AND CALCIUM
Calcium, Total (PTH): 8.3 mg/dL — ABNORMAL LOW (ref 8.6–10.2)
PTH: 50 pg/mL (ref 15–65)

## 2022-06-14 ENCOUNTER — Inpatient Hospital Stay: Payer: Medicare Other

## 2022-06-14 VITALS — BP 143/63 | HR 67 | Temp 97.2°F | Resp 18

## 2022-06-14 DIAGNOSIS — E559 Vitamin D deficiency, unspecified: Secondary | ICD-10-CM | POA: Diagnosis not present

## 2022-06-14 DIAGNOSIS — Z5112 Encounter for antineoplastic immunotherapy: Secondary | ICD-10-CM | POA: Diagnosis not present

## 2022-06-14 DIAGNOSIS — D6481 Anemia due to antineoplastic chemotherapy: Secondary | ICD-10-CM | POA: Diagnosis not present

## 2022-06-14 DIAGNOSIS — C9000 Multiple myeloma not having achieved remission: Secondary | ICD-10-CM | POA: Diagnosis not present

## 2022-06-14 DIAGNOSIS — Z95828 Presence of other vascular implants and grafts: Secondary | ICD-10-CM

## 2022-06-14 DIAGNOSIS — D630 Anemia in neoplastic disease: Secondary | ICD-10-CM | POA: Diagnosis not present

## 2022-06-14 LAB — PHOSPHORUS: Phosphorus: 2.7 mg/dL (ref 2.5–4.6)

## 2022-06-14 MED ORDER — SODIUM CHLORIDE 0.9% FLUSH
10.0000 mL | Freq: Once | INTRAVENOUS | Status: AC
  Administered 2022-06-14: 10 mL via INTRAVENOUS

## 2022-06-14 MED ORDER — HEPARIN SOD (PORK) LOCK FLUSH 100 UNIT/ML IV SOLN
500.0000 [IU] | Freq: Once | INTRAVENOUS | Status: AC
  Administered 2022-06-14: 500 [IU] via INTRAVENOUS

## 2022-06-14 NOTE — Patient Instructions (Signed)
[facility]  Discharge Instructions: Thank you for choosing [facility] to provide your oncology and hematology care.  If you have a lab appointment with the [facility], please come in thru the Main Entrance and check in at the main information desk.  Wear comfortable clothing and clothing appropriate for easy access to any Portacath or PICC line.   We strive to give you quality time with your provider. You may need to reschedule your appointment if you arrive late (15 or more minutes).  Arriving late affects you and other patients whose appointments are after yours.  Also, if you miss three or more appointments without notifying the office, you may be dismissed from the clinic at the provider's discretion.      For prescription refill requests, have your pharmacy contact our office and allow 72 hours for refills to be completed.    Today you received the following Phosphorus lab check, return as scheduled.    To help prevent nausea and vomiting after your treatment, we encourage you to take your nausea medication as directed.  BELOW ARE SYMPTOMS THAT SHOULD BE REPORTED IMMEDIATELY: *FEVER GREATER THAN 100.4 F (38 C) OR HIGHER *CHILLS OR SWEATING *NAUSEA AND VOMITING THAT IS NOT CONTROLLED WITH YOUR NAUSEA MEDICATION *UNUSUAL SHORTNESS OF BREATH *UNUSUAL BRUISING OR BLEEDING *URINARY PROBLEMS (pain or burning when urinating, or frequent urination) *BOWEL PROBLEMS (unusual diarrhea, constipation, pain near the anus) TENDERNESS IN MOUTH AND THROAT WITH OR WITHOUT PRESENCE OF ULCERS (sore throat, sores in mouth, or a toothache) UNUSUAL RASH, SWELLING OR PAIN  UNUSUAL VAGINAL DISCHARGE OR ITCHING   Items with * indicate a potential emergency and should be followed up as soon as possible or go to the Emergency Department if any problems should occur.  Please show the CHEMOTHERAPY ALERT CARD or IMMUNOTHERAPY ALERT CARD at check-in to the Emergency  Department and triage nurse.  Should you have questions after your visit or need to cancel or reschedule your appointment, please contact [facility] [phone]  and follow the prompts.  Office hours are 8:00 a.m. to 4:30 p.m. Monday - Friday. Please note that voicemails left after 4:00 p.m. may not be returned until the following business day.  We are closed weekends and major holidays. You have access to a nurse at all times for urgent questions. Please call the main number to the clinic [phone] and follow the prompts.  For any non-urgent questions, you may also contact your provider using MyChart. We now offer e-Visits for anyone 64 and older to request care online for non-urgent symptoms. For details visit [email or web address].   Also download the MyChart app! Go to the app store, search "MyChart", open the app, select [facility], and log in with your MyChart username and password.

## 2022-06-14 NOTE — Progress Notes (Signed)
Patient presents today for phosphorus check, lab result 2.7, per Dr. Delton Coombes keep taking Phosphorus 58m twice daily, next appointment is Wednesday.   Port flushed with good blood return noted. No bruising or swelling at site. Bandaid applied and patient discharged in satisfactory condition. VVS stable with no signs or symptoms of distressed noted.

## 2022-06-17 ENCOUNTER — Other Ambulatory Visit: Payer: Self-pay

## 2022-06-17 LAB — PROTEIN ELECTROPHORESIS, SERUM
A/G Ratio: 0.7 (ref 0.7–1.7)
Albumin ELP: 3 g/dL (ref 2.9–4.4)
Alpha-1-Globulin: 0.2 g/dL (ref 0.0–0.4)
Alpha-2-Globulin: 0.7 g/dL (ref 0.4–1.0)
Beta Globulin: 0.6 g/dL — ABNORMAL LOW (ref 0.7–1.3)
Gamma Globulin: 3.1 g/dL — ABNORMAL HIGH (ref 0.4–1.8)
Globulin, Total: 4.6 g/dL — ABNORMAL HIGH (ref 2.2–3.9)
M-Spike, %: 2.8 g/dL — ABNORMAL HIGH
Total Protein ELP: 7.6 g/dL (ref 6.0–8.5)

## 2022-06-18 ENCOUNTER — Telehealth: Payer: Self-pay | Admitting: *Deleted

## 2022-06-18 DIAGNOSIS — I110 Hypertensive heart disease with heart failure: Secondary | ICD-10-CM | POA: Diagnosis not present

## 2022-06-18 DIAGNOSIS — C9000 Multiple myeloma not having achieved remission: Secondary | ICD-10-CM | POA: Diagnosis not present

## 2022-06-18 DIAGNOSIS — I509 Heart failure, unspecified: Secondary | ICD-10-CM | POA: Diagnosis not present

## 2022-06-18 DIAGNOSIS — E46 Unspecified protein-calorie malnutrition: Secondary | ICD-10-CM | POA: Diagnosis not present

## 2022-06-18 DIAGNOSIS — E871 Hypo-osmolality and hyponatremia: Secondary | ICD-10-CM | POA: Diagnosis not present

## 2022-06-18 NOTE — Telephone Encounter (Signed)
Wife called to advise that he has had multiple diarrhea stools over the past day and a half, however has only been taking 1 imodium.  Gave her instructions on use and to call back in the morning to report progress.  He has stayed hydrated and she does not feel he needs fluids at this point.

## 2022-06-19 ENCOUNTER — Other Ambulatory Visit: Payer: Medicare Other

## 2022-06-19 ENCOUNTER — Inpatient Hospital Stay (HOSPITAL_BASED_OUTPATIENT_CLINIC_OR_DEPARTMENT_OTHER): Payer: Medicare Other | Admitting: Hematology

## 2022-06-19 ENCOUNTER — Inpatient Hospital Stay: Payer: Medicare Other

## 2022-06-19 ENCOUNTER — Ambulatory Visit: Payer: Medicare Other | Admitting: Hematology

## 2022-06-19 VITALS — BP 140/57 | HR 79 | Temp 98.2°F | Resp 18

## 2022-06-19 DIAGNOSIS — D6481 Anemia due to antineoplastic chemotherapy: Secondary | ICD-10-CM | POA: Diagnosis not present

## 2022-06-19 DIAGNOSIS — C9000 Multiple myeloma not having achieved remission: Secondary | ICD-10-CM | POA: Diagnosis not present

## 2022-06-19 DIAGNOSIS — E559 Vitamin D deficiency, unspecified: Secondary | ICD-10-CM | POA: Diagnosis not present

## 2022-06-19 DIAGNOSIS — D630 Anemia in neoplastic disease: Secondary | ICD-10-CM | POA: Diagnosis not present

## 2022-06-19 DIAGNOSIS — Z5112 Encounter for antineoplastic immunotherapy: Secondary | ICD-10-CM | POA: Diagnosis not present

## 2022-06-19 LAB — CBC WITH DIFFERENTIAL/PLATELET
Abs Immature Granulocytes: 0.06 10*3/uL (ref 0.00–0.07)
Basophils Absolute: 0 10*3/uL (ref 0.0–0.1)
Basophils Relative: 0 %
Eosinophils Absolute: 0 10*3/uL (ref 0.0–0.5)
Eosinophils Relative: 0 %
HCT: 23.3 % — ABNORMAL LOW (ref 39.0–52.0)
Hemoglobin: 7.7 g/dL — ABNORMAL LOW (ref 13.0–17.0)
Immature Granulocytes: 1 %
Lymphocytes Relative: 9 %
Lymphs Abs: 0.7 10*3/uL (ref 0.7–4.0)
MCH: 34.5 pg — ABNORMAL HIGH (ref 26.0–34.0)
MCHC: 33 g/dL (ref 30.0–36.0)
MCV: 104.5 fL — ABNORMAL HIGH (ref 80.0–100.0)
Monocytes Absolute: 2 10*3/uL — ABNORMAL HIGH (ref 0.1–1.0)
Monocytes Relative: 24 %
Neutro Abs: 5.4 10*3/uL (ref 1.7–7.7)
Neutrophils Relative %: 66 %
Platelets: 95 10*3/uL — ABNORMAL LOW (ref 150–400)
RBC: 2.23 MIL/uL — ABNORMAL LOW (ref 4.22–5.81)
WBC: 8.2 10*3/uL (ref 4.0–10.5)
nRBC: 0 % (ref 0.0–0.2)

## 2022-06-19 LAB — COMPREHENSIVE METABOLIC PANEL
ALT: 27 U/L (ref 0–44)
AST: 18 U/L (ref 15–41)
Albumin: 2.6 g/dL — ABNORMAL LOW (ref 3.5–5.0)
Alkaline Phosphatase: 58 U/L (ref 38–126)
Anion gap: 6 (ref 5–15)
BUN: 24 mg/dL — ABNORMAL HIGH (ref 8–23)
CO2: 23 mmol/L (ref 22–32)
Calcium: 8.4 mg/dL — ABNORMAL LOW (ref 8.9–10.3)
Chloride: 101 mmol/L (ref 98–111)
Creatinine, Ser: 0.71 mg/dL (ref 0.61–1.24)
GFR, Estimated: >60 mL/min (ref >60)
Glucose, Bld: 180 mg/dL — ABNORMAL HIGH (ref 70–99)
Potassium: 3.6 mmol/L (ref 3.5–5.1)
Sodium: 130 mmol/L — ABNORMAL LOW (ref 135–145)
Total Bilirubin: 0.7 mg/dL (ref 0.3–1.2)
Total Protein: 8.2 g/dL — ABNORMAL HIGH (ref 6.5–8.1)

## 2022-06-19 LAB — PHOSPHORUS: Phosphorus: 2.9 mg/dL (ref 2.5–4.6)

## 2022-06-19 LAB — BPAM RBC
Blood Product Expiration Date: 202403062359
Unit Type and Rh: 5100

## 2022-06-19 LAB — MAGNESIUM: Magnesium: 1.9 mg/dL (ref 1.7–2.4)

## 2022-06-19 LAB — PREPARE RBC (CROSSMATCH)

## 2022-06-19 MED ORDER — HEPARIN SOD (PORK) LOCK FLUSH 100 UNIT/ML IV SOLN
500.0000 [IU] | Freq: Once | INTRAVENOUS | Status: AC | PRN
  Administered 2022-06-19: 500 [IU]

## 2022-06-19 MED ORDER — SODIUM CHLORIDE 0.9% FLUSH
10.0000 mL | INTRAVENOUS | Status: DC | PRN
  Administered 2022-06-19: 10 mL

## 2022-06-19 MED ORDER — ACETAMINOPHEN 325 MG PO TABS
650.0000 mg | ORAL_TABLET | Freq: Once | ORAL | Status: AC
  Administered 2022-06-19: 650 mg via ORAL
  Filled 2022-06-19: qty 2

## 2022-06-19 MED ORDER — SODIUM CHLORIDE 0.9 % IV SOLN
16.0000 mg/kg | Freq: Once | INTRAVENOUS | Status: AC
  Administered 2022-06-19: 900 mg via INTRAVENOUS
  Filled 2022-06-19: qty 45

## 2022-06-19 MED ORDER — CETIRIZINE HCL 10 MG/ML IV SOLN
10.0000 mg | Freq: Once | INTRAVENOUS | Status: AC
  Administered 2022-06-19: 10 mg via INTRAVENOUS
  Filled 2022-06-19: qty 1

## 2022-06-19 MED ORDER — SODIUM CHLORIDE 0.9 % IV SOLN: Freq: Once | INTRAVENOUS | Status: AC

## 2022-06-19 MED ORDER — BORTEZOMIB CHEMO SQ INJECTION 3.5 MG (2.5MG/ML)
1.0000 mg/m2 | Freq: Once | INTRAMUSCULAR | Status: AC
  Administered 2022-06-19: 1.75 mg via SUBCUTANEOUS
  Filled 2022-06-19: qty 0.7

## 2022-06-19 MED ORDER — ONDANSETRON HCL 4 MG/2ML IJ SOLN
8.0000 mg | Freq: Once | INTRAMUSCULAR | Status: AC
  Administered 2022-06-19: 8 mg via INTRAVENOUS
  Filled 2022-06-19: qty 4

## 2022-06-19 MED ORDER — METHYLPREDNISOLONE SODIUM SUCC 125 MG IJ SOLR
100.0000 mg | Freq: Once | INTRAMUSCULAR | Status: AC
  Administered 2022-06-19: 100 mg via INTRAVENOUS
  Filled 2022-06-19: qty 2

## 2022-06-19 NOTE — Progress Notes (Signed)
Pt presents today for Dara IV and Velcade per provider's order. Vital signs and other labs WNL for treatment. Pt's hemoglobin is 7.7 and platelets are 95 today. Dr.K aware and stated to transfuse 1 unit of blood Friday. Dr.K also stated for pt to cut back to one phosphorus pill 500 mg daily. Pt and family member made aware and verbalized understanding. Okay to proceed with treatment today per Dr.K.  Dara IV and Velcade given today per MD orders. Tolerated infusion without adverse affects. Vital signs stable. No complaints at this time. Discharged from clinic ambulatory in stable condition. Alert and oriented x 3. F/U with [facility] as scheduled.

## 2022-06-19 NOTE — Progress Notes (Signed)
Dale [name] [address], [place] [address]    Clinic Day:  06/19/2022  Referring physician: Derek Jack, MD  Patient Care Team: Pcp, No as PCP - General Troy Sine, MD as PCP - Cardiology (Cardiology) Troy Sine, MD as Consulting Physician (Cardiology) Gala Romney Cristopher Estimable, MD as Consulting Physician (Gastroenterology) Derek Jack, MD as Medical Oncologist (Oncology)   ASSESSMENT & PLAN:   Assessment: 1.  IgG lambda plasma cell myeloma: - Work-up for macrocytic anemia on 12/01/2020 showed M spike of 2.9 g.  Immunofixation IgG lambda. - Lambda light chains elevated at 284.  Light chain ratio was 0.04.  LDH was 163.  Creatinine was 0.9 and calcium 8.9. - Bone marrow biopsy on 12/18/2020-hypercellular marrow for age with 48% atypical plasma cells with lambda light chain restriction. - Chromosome analysis was normal. 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 - Revlimid 5 mg, 2 weeks on/1 week off started on 01/20/2021.  Dexamethasone weekly 10 mg added on 02/07/2021.  Revlimid is discontinued around 06/15/2021 due to poor tolerance and ineffectiveness at low-dose.  Thrombocytopenia and anemia. - Velcade weekly on days 1, 8, 15 every 21 days along with dexamethasone 10 mg weekly started on 06/25/2021. 2.  Macrocytic anemia: - CBC on 12/01/2020 with hemoglobin 8.5 and MCV of 117. - Denies any bleeding per rectum or melena.  3.  Social/family history: - Lives at home with his wife.  He does all ADLs and IADLs.  He even does yard work. - He worked at [employer] for 35 years.  Denies any chemical exposure.  Non-smoker. - No family history of malignancies.  Plan:  1.  IgG lambda plasma cell myeloma, high risk: - Last chemotherapy was on 05/29/2022.  Treatment was held at last visit due to recent admission. - He reports that he is feeling well.  Reviewed labs today  which showed normal LFTs and creatinine.  CBC shows platelet count is 95 and white count is normal. - We reviewed myeloma labs from 06/12/2022 which showed M spike is 2.8 down from 3.3.  Lambda light chains are 528 and ratio is 0.01 and stable. - We will start back cycle 4-day 8 today.  He will come back next week for treatment day 15.  I will see him back in 3 weeks prior to cycle 5.  Will plan to repeat myeloma labs 1 week prior.  2.  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 - Hemoglobin is 7.7.  Will transfuse 1 unit PRBC.  3.  Hypophosphatemia: - He is taking K-Phos 500 mg twice daily. - Workup showed vitamin D is low at 25 and PTH normal at 50.  Recommend starting vitamin D 400 units daily. - He reports diarrhea for the last 3 days, once per day. - Phosphate today is 2.9.  Will cut back on K-Phos 200 mg once daily.  4.  Right ankle swelling: - Continue Lasix as needed.  5.  ID prophylaxis: - Continue acyclovir twice daily and aspirin 81 mg daily.  No orders of the defined types were placed in this encounter.     Beverly Gust Oliver,acting as a scribe for Derek Jack, MD.,have documented all relevant documentation on the behalf of Derek Jack, MD,as directed by  Derek Jack, MD while in the presence of Derek Jack, MD.   I, Derek Jack MD, have reviewed  the above documentation for accuracy and completeness, and I agree with the above.   Doyce Loose   2/14/20249:26 AM  CHIEF COMPLAINT:   Diagnosis: multiple myeloma and anemia    Cancer Staging  No matching staging information was found for the patient.   Prior Therapy: None  Current Therapy:  Weekly Velcade and daratumumab     HISTORY OF PRESENT ILLNESS:   Oncology History  Multiple myeloma ([facility])  01/15/2021 Initial Diagnosis   Multiple myeloma ([name])   06/25/2021 - 12/06/2021 Chemotherapy   Patient is on  Treatment Plan : MYELOMA NON-TRANSPLANT CANDIDATES VRd weekly q21d     12/20/2021 -  Chemotherapy   Patient is on Treatment Plan : MYELOMA Daratumumab IV q28d        INTERVAL HISTORY:   Dale [name] is a 89 y.o. [name] presenting to clinic today for follow up of multiple myeloma and anemia . He was last seen by me on 06/12/2022.  Today, he states that he is doing well overall. His appetite level is at 100%. His energy level is at 75%.  He denies having fallen recently.he has dry skin.  He has been experiencing diarrhea on occasion.he thinks he had slightly more diarrhea since K-Phos dose was increased at last visit 1 week ago.  Imodium is helping diarrhea.  Denies any nausea or vomiting.  Overall energy levels have improved from last visit.   PAST MEDICAL HISTORY:   Past Medical History: Past Medical History:  Diagnosis Date   Allergic rhinitis    Anal fissure    Anginal pain (H[facility])    Asthma    Cancer ([facility])    skin   Chest pain    CHF (congestive heart failure) ([facility])    Coronary heart disease    s/p stenting. cath in 01/2012 noncritical occlusion   Dyspnea    Dysrhythmia    1st degree heart block   GERD (gastroesophageal reflux disease)    Glaucoma    Hiatal hernia    Hyperlipidemia    Hypertension    Hypothyroidism    Idiopathic thrombocytopenic purpura ([facility]) 2002   Macular degeneration    Multiple myeloma ([facility]) 01/15/2021   Nephrolithiasis    PUD (peptic ulcer disease)    remote   Sarcoidosis    pulmonary   Schatzki's ring     Surgical History: Past Surgical History:  Procedure Laterality Date   cardiac stents     COLONOSCOPY  10/30/2006   Normal rectum, sigmoid diverticula.Remainder of colonic mucosa appeared normal.   CORONARY ANGIOPLASTY WITH STENT PLACEMENT     about 10 years ago per pt (around 2007)   [place], URETEROSCOPY AND STENT PLACEMENT Left 06/16/2017   Procedure: CYSTOSCOPY WITH RETROGRADE PYELOGRAM, URETEROSCOPY,STONE  EXTRACTION  AND STENT PLACEMENT;  Surgeon: Franchot Gallo, MD;  Location: [facility];  Service: Urology;  Laterality: Left;   ESOPHAGOGASTRODUODENOSCOPY  06/19/2004   Two esophageal rings and esophageal web as described above.  All of these were disrupted by passing 56-French Venia Minks dilator/ Candida esophagitis,which appears to be incidental given history of   antibiotic use, but nevertheless will be treated.   ESOPHAGOGASTRODUODENOSCOPY  10/30/2006   Distal tandem esophageal ring status post dilation disruption as  described above.  Otherwise normal esophagus/  Small hiatal hernia otherwise normal stomach, D1 and D2   ESOPHAGOGASTRODUODENOSCOPY N/A 03/22/2015   Dr.Rourk- noncritical schatzki's ring and hiatal hernia-o/w normal EGD.    ESOPHAGOGASTRODUODENOSCOPY (EGD) WITH ESOPHAGEAL DILATION  03/04/2012   RMR- schatzki's  ring, hiatal hernia, polypoid gastric mucosa, bx= minimally active gastritis.   HOLMIUM LASER APPLICATION Left [id number]   Procedure: HOLMIUM LASER APPLICATION;  Surgeon: Franchot Gallo, MD;  Location: [facility];  Service: Urology;  Laterality: Left;   IR CV LINE INJECTION  12/27/2021   IR GENERIC HISTORICAL  03/06/2016   IR RADIOLOGIST EVAL & MGMT 03/06/2016 Aletta Edouard, MD [facility] INTERV RAD   IR GENERIC HISTORICAL  06/18/2016   IR RADIOLOGIST EVAL & MGMT 06/18/2016 Aletta Edouard, MD [facility] INTERV RAD   IR IMAGING GUIDED PORT INSERTION  12/18/2021   IR IMAGING GUIDED PORT INSERTION  01/15/2022   IR RADIOLOGIST EVAL & MGMT  10/01/2016   IR RADIOLOGIST EVAL & MGMT  10/15/2017   IR RADIOLOGIST EVAL & MGMT  12/24/2018   IR RADIOLOGIST EVAL & MGMT  01/04/2020   IR RADIOLOGIST EVAL & MGMT  06/20/2021   IR REMOVAL TUN ACCESS W/ PORT W/O FL MOD SED  01/15/2022   LEFT HEART CATH N/A 02/02/2012   Procedure: LEFT HEART CATH;  Surgeon: Lorretta Harp, MD;  Location: [facility] CATH LAB;  Service: Cardiovascular;  Laterality: N/A;   MEDIASTINOSCOPY     for dx sarcoid   RADIOLOGY WITH ANESTHESIA  Left 05/17/2016   Procedure: left renal ablation;  Surgeon: Aletta Edouard, MD;  Location: [facility];  Service: Radiology;  Laterality: Left;    Social History: 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    Employer: RETIRED  Tobacco Use   Smoking status: Former    Packs/day: 0.10    Years: 2.00    Total pack years: 0.20    Types: Cigarettes, Cigars    Quit date: 05/06/1970    Years since quitting: 52.1   Smokeless tobacco: Never  Vaping Use   Vaping Use: Never used  Substance and Sexual Activity   Alcohol use: No    Alcohol/week: 0.0 standard drinks of alcohol   Drug use: No   Sexual activity: Never  Other Topics Concern   Not on file  Social History Narrative   Not on file   Social Determinants of Health   Financial Resource Strain: Not on file  Food Insecurity: No Food Insecurity (06/04/2022)   Hunger Vital Sign    Worried About Running Out of Food in the Last Year: Never true    Ran Out of Food in the Last Year: Never true  Transportation Needs: No Transportation Needs (06/04/2022)   PRAPARE - Hydrologist (Medical): No    Lack of Transportation (Non-Medical): No  Physical Activity: Not on file  Stress: Not on file  Social Connections: Not on file  Intimate Partner Violence: Not At Risk (06/04/2022)   Humiliation, Afraid, Rape, and Kick questionnaire    Fear of Current or Ex-Partner: No    Emotionally Abused: No    Physically Abused: No    Sexually Abused: No    Family History: Family History  Problem Relation Age of Onset   Heart disease Father        deceased age 58   Stroke Mother    Alzheimer's disease Mother    Heart attack Brother        deceased age  98   Cancer Other        niece   Colon  cancer Neg Hx     Current Medications:  Current Outpatient Medications:    acetaminophen (TYLENOL) 325 MG tablet, Take 2 tablets (650 mg total) by mouth every 6 (six) hours as needed for mild pain (or Fever >/= 101)., Disp: , Rfl:    acyclovir (ZOVIRAX) 400 MG tablet, TAKE 1 TABLET BY MOUTH TWICE  DAILY, Disp: 180 tablet, Rfl: 0   Ascorbic Acid (VITAMIN C PO), Take 500 mg by mouth every evening., Disp: , Rfl:    aspirin EC 81 MG tablet, Take 1 tablet (81 mg total) by mouth daily with breakfast. For 30 days Only, Disp: 30 tablet, Rfl: 0   atorvastatin (LIPITOR) 40 MG tablet, TAKE 1 TABLET BY MOUTH IN  THE EVENING (Patient taking differently: Take 40 mg by mouth daily.), Disp: 90 tablet, Rfl: 3   Bortezomib (VELCADE IJ), Inject as directed once a week., Disp: , Rfl:    brimonidine (ALPHAGAN) 0.2 % ophthalmic solution, Place 1 drop into the left eye 2 (two) times daily., Disp: , Rfl:    cyanocobalamin 1000 MCG tablet, Take 1 tablet (1,000 mcg total) by mouth daily., Disp: 30 tablet, Rfl: 3   dexamethasone (DECADRON) 2 MG tablet, TAKE 5 TABLETS BY MOUTH ONCE  WEEKLY (Patient taking differently: Take 10 mg by mouth once a week.), Disp: 60 tablet, Rfl: 0   dorzolamide-timolol (COSOPT) 22.3-6.8 MG/ML ophthalmic solution, Place 1 drop into the left eye 2 (two) times daily., Disp: , Rfl:    feeding supplement (ENSURE ENLIVE / ENSURE PLUS) LIQD, Take 237 mLs by mouth 3 (three) times daily between meals., Disp: , Rfl:    isosorbide mononitrate (IMDUR) 60 MG 24 hr tablet, Take 1 tablet (60 mg total) by mouth daily. TAKE 1 AND 1/2 TABLETS BY  MOUTH IN THE MORNING AND  1/2 TABLET AT NIGHT (Patient taking differently: Take 60 mg by mouth daily. TAKE 1 TABLETS BY  MOUTH IN THE MORNING AND  1/2 TABLET AT NIGHT), Disp: , Rfl:    levothyroxine (SYNTHROID) 75 MCG tablet, Take 75 mcg by mouth daily before breakfast., Disp: , Rfl:    losartan (COZAAR) 100 MG tablet, TAKE 1 TABLET BY MOUTH DAILY (Patient taking  differently: Take 100 mg by mouth daily.), Disp: 90 tablet, Rfl: 3   metoprolol tartrate (LOPRESSOR) 25 MG tablet, TAKE 1 TABLET BY MOUTH IN  THE MORNING AND ONE-HALF  TABLET BY MOUTH IN THE  EVENING (Patient taking differently: Take 12.5-25 mg by mouth in the morning and at bedtime. TAKE 1 TABLET BY MOUTH IN  THE MORNING AND ONE-HALF  TABLET BY MOUTH IN THE  EVENING), Disp: 135 tablet, Rfl: 3   Multiple Vitamins-Minerals (PRESERVISION/LUTEIN) CAPS, Take 1 capsule by mouth 2 (two) times daily., Disp: , Rfl:    pantoprazole (PROTONIX) 40 MG tablet, TAKE 1 TABLET BY MOUTH DAILY  BEFORE BREAKFAST (Patient taking differently: Take 40 mg by mouth daily.), Disp: 90 tablet, Rfl: 3   potassium phosphate, monobasic, (PHOSPHO-TRIN K500) 500 MG tablet, Take 1 tablet (500 mg total) by mouth 2 (two) times daily with a meal., Disp: 60 tablet, Rfl: 1   ROCKLATAN 0.02-0.005 % SOLN, Place 1 drop into the left eye at bedtime., Disp: , Rfl:    triamcinolone (KENALOG) 0.1 % cream, Apply 1 application. topically 2 (two) times daily as needed (for irritation)., Disp: , Rfl:    vitamin E 200 UNIT capsule, Take  200 Units by mouth every evening., Disp: , Rfl:    lidocaine-prilocaine (EMLA) cream, Apply 1 Application topically as needed. (Patient not taking: Reported on 06/19/2022), Disp: 30 g, Rfl: 1   nitroGLYCERIN (NITROSTAT) 0.4 MG SL tablet, Place 1 tablet (0.4 mg total) under the tongue every 5 (five) minutes as needed. For chest pain (Patient not taking: Reported on 06/19/2022), Disp: 25 tablet, Rfl: 2   prochlorperazine (COMPAZINE) 10 MG tablet, Take 1 tablet (10 mg total) by mouth every 6 (six) hours as needed for nausea or vomiting. (Patient not taking: Reported on 06/19/2022), Disp: 30 tablet, Rfl: 3 No current facility-administered medications for this visit.  Facility-Administered Medications Ordered in Other Visits:    acetaminophen (TYLENOL) 325 MG tablet, , , ,    diphenhydrAMINE (BENADRYL) 25 mg capsule, , , ,     montelukast (SINGULAIR) 10 MG tablet, , , ,    Allergies: Allergies  Allergen Reactions   Azithromycin Other (See Comments)    Sore mouth and fever blisters around mouth, sores in nose area as well   Doxazosin Shortness Of Breath   Atenolol Other (See Comments)    UNKNOWN REACTION   Hydrocodone Nausea And Vomiting   Levofloxacin Other (See Comments)    Caused stomach problems.   Morphine Other (See Comments)    "made me crazy"   Penicillins Nausea And Vomiting and Other (See Comments)    Has patient had a PCN reaction causing immediate rash, facial/tongue/throat swelling, SOB or lightheadedness with hypotension: No Has patient had a PCN reaction causing severe rash involving mucus membranes or skin necrosis: No Has patient had a PCN reaction that required hospitalization No Has patient had a PCN reaction occurring within the last 10 years: No If all of the above answers are "NO", then may proceed with Cephalosporin use.    Sulfonamide Derivatives Nausea And Vomiting    REVIEW OF SYSTEMS:   Review of Systems  Constitutional:  Negative for chills, fatigue and fever.  HENT:   Negative for lump/mass, mouth sores, nosebleeds, sore throat and trouble swallowing.   Eyes:  Negative for eye problems.  Respiratory:  Positive for shortness of breath. Negative for cough.   Cardiovascular:  Negative for chest pain (W/exertion), leg swelling and palpitations.  Gastrointestinal:  Positive for constipation and diarrhea. Negative for abdominal pain, nausea and vomiting.  Genitourinary:  Negative for bladder incontinence, difficulty urinating, dysuria, frequency, hematuria and nocturia.   Musculoskeletal:  Negative for arthralgias, back pain, flank pain, myalgias and neck pain.  Skin:  Negative for itching and rash.  Neurological:  Negative for dizziness, headaches and numbness.  Hematological:  Does not bruise/bleed easily.  Psychiatric/Behavioral:  Negative for depression, sleep disturbance  and suicidal ideas. The patient is not nervous/anxious.   All other systems reviewed and are negative.    VITALS:   There were no vitals taken for this visit.  Wt Readings from Last 3 Encounters:  06/19/22 125 lb (56.7 kg)  06/12/22 124 lb 9.6 oz (56.5 kg)  06/03/22 125 lb (56.7 kg)    There is no height or weight on file to calculate BMI.  Performance status (ECOG): 1 - Symptomatic but completely ambulatory  PHYSICAL EXAM:   Physical Exam Vitals and nursing note reviewed. Exam conducted with a chaperone present.  Constitutional:      Appearance: Normal appearance.  Cardiovascular:     Rate and Rhythm: Normal rate and regular rhythm.     Pulses: Normal pulses.  Heart sounds: Normal heart sounds.  Pulmonary:     Effort: Pulmonary effort is normal.     Breath sounds: Normal breath sounds.  Abdominal:     Palpations: Abdomen is soft. There is no hepatomegaly, splenomegaly or mass.     Tenderness: There is no abdominal tenderness.  Musculoskeletal:     Right lower leg: No edema.     Left lower leg: No edema.  Lymphadenopathy:     Cervical: No cervical adenopathy.     Right cervical: No superficial, deep or posterior cervical adenopathy.    Left cervical: No superficial, deep or posterior cervical adenopathy.     Upper Body:     Right upper body: No supraclavicular or axillary adenopathy.     Left upper body: No supraclavicular or axillary adenopathy.  Neurological:     General: No focal deficit present.     Mental Status: He is alert and oriented to person, place, and time.  Psychiatric:        Mood and Affect: Mood normal.        Behavior: Behavior normal.     LABS:      Latest Ref Rng & Units 06/19/2022    7:53 AM 06/12/2022    7:56 AM 06/05/2022    9:31 AM  CBC  WBC 4.0 - 10.5 K/uL 8.2  4.9  4.5   Hemoglobin 13.0 - 17.0 g/dL 7.7  8.2  9.8   Hematocrit 39.0 - 52.0 % 23.3  25.3  29.7   Platelets 150 - 400 K/uL 95  87  104       Latest Ref Rng & Units  06/19/2022    7:53 AM 06/12/2022    9:28 AM 06/12/2022    7:56 AM  CMP  Glucose 70 - 99 mg/dL 180   98   BUN 8 - 23 mg/dL 24   20   Creatinine 0.61 - 1.24 mg/dL 0.71   0.75   Sodium 135 - 145 mmol/L 130   129   Potassium 3.5 - 5.1 mmol/L 3.6   3.7   Chloride 98 - 111 mmol/L 101   99   CO2 22 - 32 mmol/L 23   25   Calcium 8.9 - 10.3 mg/dL 8.4  8.3  8.3   Total Protein 6.5 - 8.1 g/dL 8.2   7.9   Total Bilirubin 0.3 - 1.2 mg/dL 0.7   0.2   Alkaline Phos 38 - 126 U/L 58   57   AST 15 - 41 U/L 18   17   ALT 0 - 44 U/L 27   23      No results found for: "CEA1", "CEA" / No results found for: "CEA1", "CEA" No results found for: "PSA1" No results found for: "CAN199" No results found for: "CAN125"  Lab Results  Component Value Date   TOTALPROTELP 7.6 06/12/2022   ALBUMINELP 3.0 06/12/2022   A1GS 0.2 06/12/2022   A2GS 0.7 06/12/2022   BETS 0.6 (L) 06/12/2022   GAMS 3.1 (H) 06/12/2022   MSPIKE 2.8 (H) 06/12/2022   SPEI Comment 06/12/2022   Lab Results  Component Value Date   TIBC 257 08/23/2021   TIBC 298 02/07/2021   TIBC 263 05/27/2017   FERRITIN 1,415 (H) 01/24/2022   FERRITIN 939 (H) 08/23/2021   FERRITIN 123 02/07/2021   IRONPCTSAT 90 (H) 08/23/2021   IRONPCTSAT 17 (L) 02/07/2021   IRONPCTSAT 44 05/27/2017   Lab Results  Component Value Date   LDH 115  05/01/2021   LDH 120 04/09/2021   LDH 124 04/02/2021     STUDIES:   ECHOCARDIOGRAM LIMITED  Result Date: 06/04/2022    ECHOCARDIOGRAM LIMITED REPORT   Patient Name:   AMEL DEBOIS Date of Exam: 06/04/2022 Medical Rec #:  [id number]      Height:       68.0 in Accession #:    [id number]     Weight:       125.0 lb Date of Birth:  1929-04-22      BSA:          1.674 m Patient Age:    26 years       BP:           192/86 mmHg Patient Gender: M              HR:           75 bpm. Exam Location:  [facility] Procedure: Limited Echo, Limited Color Doppler and Cardiac Doppler Indications:    TIA G45.9  History:        Patient has  prior history of Echocardiogram examinations, most                 recent 02/07/2022. CAD, Signs/Symptoms:Altered Mental Status;                 Risk Factors:Dyslipidemia and Hypertension.  Sonographer:    Greer Pickerel Referring Phys: [id number] OLADAPO ADEFESO  Sonographer Comments: Image acquisition challenging due to patient body habitus. IMPRESSIONS  1. Limited Echo to evaluate for LVEF  2. Left ventricular ejection fraction, by estimation, is 60 to 65%. The left ventricle has normal function. The left ventricle has no regional wall motion abnormalities. Left ventricular diastolic function could not be evaluated. Comparison(s): No significant change from prior study. FINDINGS  Left Ventricle: Left ventricular ejection fraction, by estimation, is 60 to 65%. The left ventricle has normal function. The left ventricle has no regional wall motion abnormalities. The left ventricular internal cavity size was normal in size. There is  no left ventricular hypertrophy. Left ventricular diastolic function could not be evaluated. Right Ventricle: The right ventricular size is normal. Right vetricular wall thickness was not assessed. Right ventricular systolic function was not well visualized. Left Atrium: Left atrial size was not assessed. Right Atrium: Right atrial size was not assessed. Pericardium: There is no evidence of pericardial effusion. Mitral Valve: The mitral valve is normal in structure. Trivial mitral valve regurgitation. No evidence of mitral valve stenosis. Tricuspid Valve: The tricuspid valve is normal in structure. Tricuspid valve regurgitation is not demonstrated. No evidence of tricuspid stenosis. Aortic Valve: The aortic valve is tricuspid. Aortic valve regurgitation is not visualized. No aortic stenosis is present. Pulmonic Valve: The pulmonic valve was not well visualized. Pulmonic valve regurgitation is not visualized. No evidence of pulmonic stenosis. Aorta: Aortic root could not be assessed. Venous:  The inferior vena cava is normal in size with greater than 50% respiratory variability, suggesting right atrial pressure of 3 mmHg. Additional Comments: Spectral Doppler performed. Color Doppler performed.  LEFT VENTRICLE PLAX 2D LVIDd:         4.20 cm LVIDs:         2.60 cm LV PW:         1.10 cm LV IVS:        1.00 cm  MITRAL VALVE               TRICUSPID VALVE MV Area (PHT):  3.85 cm    TR Peak grad:   12.4 mmHg MV Decel Time: 197 msec    TR Vmax:        176.00 cm/s MV E velocity: 82.60 cm/s MV A velocity: 82.00 cm/s MV E/A ratio:  1.01 Vishnu Priya Mallipeddi Electronically signed by Lorelee Cover Mallipeddi Signature Date/Time: 06/04/2022/1:36:43 PM    Final    US Carotid Bilateral  Result Date: 06/04/2022 CLINICAL DATA:  TIA symptoms EXAM: BILATERAL CAROTID DUPLEX ULTRASOUND TECHNIQUE: Pearline Cables scale imaging, color Doppler and duplex ultrasound were performed of bilateral carotid and vertebral arteries in the neck. COMPARISON:  None Available. FINDINGS: Criteria: Quantification of carotid stenosis is based on velocity parameters that correlate the residual internal carotid diameter with NASCET-based stenosis levels, using the diameter of the distal internal carotid lumen as the denominator for stenosis measurement. The following velocity measurements were obtained: RIGHT ICA: 100/17 cm/sec CCA: 0000000 cm/sec SYSTOLIC ICA/CCA RATIO:  1.3 ECA: 99 cm/sec LEFT ICA: 100/21 cm/sec CCA: 123456 cm/sec SYSTOLIC ICA/CCA RATIO:  1.3 ECA: 109 cm/sec RIGHT CAROTID ARTERY: Calcified bifurcation atherosclerosis. Negative for stenosis, velocity elevation, or turbulent flow. No significant waveform abnormality or stenosis by ultrasound. Degree of narrowing less than 50%. RIGHT VERTEBRAL ARTERY:  Normal antegrade flow LEFT CAROTID ARTERY: Similar calcified bifurcation at sclerosis. Also negative for significant stenosis, velocity elevation, or turbulent flow. Degree of narrowing also less than 50%. LEFT VERTEBRAL ARTERY:  Normal  antegrade flow IMPRESSION: 1. Bilateral carotid atherosclerosis. Negative for significant stenosis by ultrasound. Degree of narrowing less than 50% bilaterally by ultrasound criteria. 2. Patent antegrade vertebral flow bilaterally. Electronically Signed   By: Jerilynn Mages.  Shick M.D.   On: 06/04/2022 12:10   MR BRAIN WO CONTRAST  Result Date: 06/04/2022 CLINICAL DATA:  Neuro deficit, acute, stroke suspected EXAM: MRI HEAD WITHOUT CONTRAST TECHNIQUE: Multiplanar, multiecho pulse sequences of the brain and surrounding structures were obtained without intravenous contrast. COMPARISON:  CT head 06/03/2022 FINDINGS: Brain: No acute infarction, hemorrhage, hydrocephalus, extra-axial collection or mass lesion. Mild for age scattered T2/FLAIR hyperintensities in the white matter, compatible with chronic microvascular ischemic disease. Vascular: Major arterial flow voids are maintained at the skull base. Skull and upper cervical spine: Normal marrow signal. Sinuses/Orbits: Near complete opacification of the left maxillary sinus with mucosal thickening and centrally T2 hypointense material which may represent fungal colonization. Other: No mastoid effusions. IMPRESSION: 1. No evidence of acute intracranial abnormality. 2. Near complete opacification of the left maxillary sinus with mucosal thickening and centrally T2 hypointense material which may represent fungal colonization. Electronically Signed   By: Margaretha Sheffield M.D.   On: 06/04/2022 08:49   CT Head Wo Contrast  Result Date: 06/03/2022 CLINICAL DATA:  Altered mental status, confusion EXAM: CT HEAD WITHOUT CONTRAST TECHNIQUE: Contiguous axial images were obtained from the base of the skull through the vertex without intravenous contrast. RADIATION DOSE REDUCTION: This exam was performed according to the departmental dose-optimization program which includes automated exposure control, adjustment of the mA and/or kV according to patient size and/or use of iterative  reconstruction technique. COMPARISON:  01/16/2022 FINDINGS: Brain: No evidence of acute infarction, hemorrhage, hydrocephalus, extra-axial collection or mass lesion/mass effect. Subcortical white matter and periventricular small vessel ischemic changes. Vascular: Intracranial atherosclerosis. Skull: Normal. Negative for fracture or focal lesion. Sinuses/Orbits: Chronic expansile opacification of the left maxillary sinus. Visualized paranasal sinuses and mastoid air cells are otherwise clear. Other: None. IMPRESSION: No evidence of acute intracranial abnormality. Small vessel ischemic changes. Electronically Signed   By:  Julian Hy M.D.   On: 06/03/2022 21:27

## 2022-06-19 NOTE — Patient Instructions (Addendum)
[place] at [facility] Discharge Instructions   You were seen and examined today by Dr. Delton Coombes.  He reviewed the results of your myeloma labs which are improved. You blood counts today and your kidney and liver test results are pending.   Labs from last week shows your Vitamin D is low. Please take the vitamin D pills you have once a day. If you do not have vitamin D, please get Vitamin D 400 units (over-the-counter) and take one capsule daily.   We will proceed with your treatment today pending your lab results.   Return as scheduled.    Thank you for choosing [place] at [facility] to provide your oncology and hematology care.  To afford each patient quality time with our provider, please arrive at least 15 minutes before your scheduled appointment time.   If you have a lab appointment with the [facility] please come in thru the Main Entrance and check in at the main information desk.  You need to re-schedule your appointment should you arrive 10 or more minutes late.  We strive to give you quality time with our providers, and arriving late affects you and other patients whose appointments are after yours.  Also, if you no show three or more times for appointments you may be dismissed from the clinic at the providers discretion.     Again, thank you for choosing [facility].  Our hope is that these requests will decrease the amount of time that you wait before being seen by our physicians.       _____________________________________________________________  Should you have questions after your visit to [facility], please contact our office at [phone] and follow the prompts.  Our office hours are 8:00 a.m. and 4:30 p.m. Monday - Friday.  Please note that voicemails left after 4:00 p.m. may not be returned until the following business day.  We are closed weekends and major holidays.  You do have access  to a nurse 24-7, just call the main number to the clinic [phone] and do not press any options, hold on the line and a nurse will answer the phone.    For prescription refill requests, have your pharmacy contact our office and allow 72 hours.    Due to Covid, you will need to wear a mask upon entering the hospital. If you do not have a mask, a mask will be given to you at the Main Entrance upon arrival. For doctor visits, patients may have 1 support person age 31 or older with them. For treatment visits, patients can not have anyone with them due to social distancing guidelines and our immunocompromised population.

## 2022-06-19 NOTE — Patient Instructions (Signed)
[name]  Discharge Instructions: Thank you for choosing [place] to provide your oncology and hematology care.  If you have a lab appointment with the [facility], please come in thru the Main Entrance and check in at the main information desk.  Wear comfortable clothing and clothing appropriate for easy access to any Portacath or PICC line.   We strive to give you quality time with your provider. You may need to reschedule your appointment if you arrive late (15 or more minutes).  Arriving late affects you and other patients whose appointments are after yours.  Also, if you miss three or more appointments without notifying the office, you may be dismissed from the clinic at the provider's discretion.      For prescription refill requests, have your pharmacy contact our office and allow 72 hours for refills to be completed.    Today you received the following chemotherapy and/or immunotherapy agents Dara IV and Velcade    To help prevent nausea and vomiting after your treatment, we encourage you to take your nausea medication as directed.  Daratumumab Injection What is this medication? DARATUMUMAB (dar a toom ue mab) treats multiple myeloma, a type of bone marrow cancer. It works by helping your immune system slow or stop the spread of cancer cells. It is a monoclonal antibody. This medicine may be used for other purposes; ask your health care provider or pharmacist if you have questions. COMMON BRAND NAME(S): DARZALEX What should I tell my care team before I take this medication? They need to know if you have any of these conditions: Hereditary fructose intolerance Infection, such as chickenpox, herpes, hepatitis B virus Lung or breathing disease, such as asthma, COPD An unusual or allergic reaction to daratumumab, sorbitol, other medications, foods, dyes, or preservatives Pregnant or trying to get pregnant Breast-feeding How should I use this  medication? This medication is injected into a vein. It is given by your care team in a hospital or clinic setting. Talk to your care team about the use of this medication in children. Special care may be needed. Overdosage: If you think you have taken too much of this medicine contact a poison control center or emergency room at once. NOTE: This medicine is only for you. Do not share this medicine with others. What if I miss a dose? Keep appointments for follow-up doses. It is important not to miss your dose. Call your care team if you are unable to keep an appointment. What may interact with this medication? Interactions have not been studied. This list may not describe all possible interactions. Give your health care provider a list of all the medicines, herbs, non-prescription drugs, or dietary supplements you use. Also tell them if you smoke, drink alcohol, or use illegal drugs. Some items may interact with your medicine. What should I watch for while using this medication? Your condition will be monitored carefully while you are receiving this medication. This medication can cause serious allergic reactions. To reduce your risk, your care team may give you other medication to take before receiving this one. Be sure to follow the directions from your care team. This medication can affect the results of blood tests to match your blood type. These changes can last for up to 6 months after the final dose. Your care team will do blood tests to match your blood type before you start treatment. Tell all of your care team that you are being treated with this  medication before receiving a blood transfusion. This medication can affect the results of some tests used to determine treatment response; extra tests may be needed to evaluate response. Talk to your care team if you wish to become pregnant or think you are pregnant. This medication can cause serious birth defects if taken during pregnancy and for  3 months after the last dose. A reliable form of contraception is recommended while taking this medication and for 3 months after the last dose. Talk to your care team about effective forms of contraception. Do not breast-feed while taking this medication. What side effects may I notice from receiving this medication? Side effects that you should report to your care team as soon as possible: Allergic reactions--skin rash, itching, hives, swelling of the face, lips, tongue, or throat Infection--fever, chills, cough, sore throat, wounds that don't heal, pain or trouble when passing urine, general feeling of discomfort or being unwell Infusion reactions--chest pain, shortness of breath or trouble breathing, feeling faint or lightheaded Unusual bruising or bleeding Side effects that usually do not require medical attention (report to your care team if they continue or are bothersome): Constipation Diarrhea Fatigue Nausea Pain, tingling, or numbness in the hands or feet Swelling of the ankles, hands, or feet This list may not describe all possible side effects. Call your doctor for medical advice about side effects. You may report side effects to FDA at 1-800-FDA-1088. Where should I keep my medication? This medication is given in a hospital or clinic. It will not be stored at home. NOTE: This sheet is a summary. It may not cover all possible information. If you have questions about this medicine, talk to your doctor, pharmacist, or health care provider.  2023 Elsevier/Gold Standard (2021-08-15 00:00:00)  Bortezomib Injection What is this medication? BORTEZOMIB (bor TEZ oh mib) treats lymphoma. It may also be used to treat multiple myeloma, a type of bone marrow cancer. It works by blocking a protein that causes cancer cells to grow and multiply. This helps to slow or stop the spread of cancer cells. This medicine may be used for other purposes; ask your health care provider or pharmacist if you  have questions. COMMON BRAND NAME(S): Velcade What should I tell my care team before I take this medication? They need to know if you have any of these conditions: Dehydration Diabetes Heart disease Liver disease Tingling of the fingers or toes or other nerve disorder An unusual or allergic reaction to bortezomib, other medications, foods, dyes, or preservatives If you or your partner are pregnant or trying to get pregnant Breastfeeding How should I use this medication? This medication is injected into a vein or under the skin. It is given by your care team in a hospital or clinic setting. Talk to your care team about the use of this medication in children. Special care may be needed. Overdosage: If you think you have taken too much of this medicine contact a poison control center or emergency room at once. NOTE: This medicine is only for you. Do not share this medicine with others. What if I miss a dose? Keep appointments for follow-up doses. It is important not to miss your dose. Call your care team if you are unable to keep an appointment. What may interact with this medication? Ketoconazole Rifampin This list may not describe all possible interactions. Give your health care provider a list of all the medicines, herbs, non-prescription drugs, or dietary supplements you use. Also tell them if you smoke, drink  alcohol, or use illegal drugs. Some items may interact with your medicine. What should I watch for while using this medication? Your condition will be monitored carefully while you are receiving this medication. You may need blood work while taking this medication. This medication may affect your coordination, reaction time, or judgment. Do not drive or operate machinery until you know how this medication affects you. Sit up or stand slowly to reduce the risk of dizzy or fainting spells. Drinking alcohol with this medication can increase the risk of these side effects. This  medication may increase your risk of getting an infection. Call your care team for advice if you get a fever, chills, sore throat, or other symptoms of a cold or flu. Do not treat yourself. Try to avoid being around people who are sick. Check with your care team if you have severe diarrhea, nausea, and vomiting, or if you sweat a lot. The loss of too much body fluid may make it dangerous for you to take this medication. Talk to your care team if you may be pregnant. Serious birth defects can occur if you take this medication during pregnancy and for 7 months after the last dose. You will need a negative pregnancy test before starting this medication. Contraception is recommended while taking this medication and for 7 months after the last dose. Your care team can help you find the option that works for you. If your partner can get pregnant, use a condom during sex while taking this medication and for 4 months after the last dose. Do not breastfeed while taking this medication and for 2 months after the last dose. This medication may cause infertility. Talk to your care team if you are concerned about your fertility. What side effects may I notice from receiving this medication? Side effects that you should report to your care team as soon as possible: Allergic reactions--skin rash, itching, hives, swelling of the face, lips, tongue, or throat Bleeding--bloody or black, tar-like stools, vomiting blood or brown material that looks like coffee grounds, red or dark brown urine, small red or purple spots on skin, unusual bruising or bleeding Bleeding in the brain--severe headache, stiff neck, confusion, dizziness, change in vision, numbness or weakness of the face, arm, or leg, trouble speaking, trouble walking, vomiting Bowel blockage--stomach cramping, unable to have a bowel movement or pass gas, loss of appetite, vomiting Heart failure--shortness of breath, swelling of the ankles, feet, or hands, sudden  weight gain, unusual weakness or fatigue Infection--fever, chills, cough, sore throat, wounds that don't heal, pain or trouble when passing urine, general feeling of discomfort or being unwell Liver injury--right upper belly pain, loss of appetite, nausea, light-colored stool, dark yellow or brown urine, yellowing skin or eyes, unusual weakness or fatigue Low blood pressure--dizziness, feeling faint or lightheaded, blurry vision Lung injury--shortness of breath or trouble breathing, cough, spitting up blood, chest pain, fever Pain, tingling, or numbness in the hands or feet Severe or prolonged diarrhea Stomach pain, bloody diarrhea, pale skin, unusual weakness or fatigue, decrease in the amount of urine, which may be signs of hemolytic uremic syndrome Sudden and severe headache, confusion, change in vision, seizures, which may be signs of posterior reversible encephalopathy syndrome (PRES) TTP--purple spots on the skin or inside the mouth, pale skin, yellowing skin or eyes, unusual weakness or fatigue, fever, fast or irregular heartbeat, confusion, change in vision, trouble speaking, trouble walking Tumor lysis syndrome (TLS)--nausea, vomiting, diarrhea, decrease in the amount of urine, dark urine, unusual  weakness or fatigue, confusion, muscle pain or cramps, fast or irregular heartbeat, joint pain Side effects that usually do not require medical attention (report to your care team if they continue or are bothersome): Constipation Diarrhea Fatigue Loss of appetite Nausea This list may not describe all possible side effects. Call your doctor for medical advice about side effects. You may report side effects to FDA at 1-800-FDA-1088. Where should I keep my medication? This medication is given in a hospital or clinic. It will not be stored at home. NOTE: This sheet is a summary. It may not cover all possible information. If you have questions about this medicine, talk to your doctor, pharmacist, or  health care provider.  2023 Elsevier/Gold Standard (2021-09-19 00:00:00)    BELOW ARE SYMPTOMS THAT SHOULD BE REPORTED IMMEDIATELY: *FEVER GREATER THAN 100.4 F (38 C) OR HIGHER *CHILLS OR SWEATING *NAUSEA AND VOMITING THAT IS NOT CONTROLLED WITH YOUR NAUSEA MEDICATION *UNUSUAL SHORTNESS OF BREATH *UNUSUAL BRUISING OR BLEEDING *URINARY PROBLEMS (pain or burning when urinating, or frequent urination) *BOWEL PROBLEMS (unusual diarrhea, constipation, pain near the anus) TENDERNESS IN MOUTH AND THROAT WITH OR WITHOUT PRESENCE OF ULCERS (sore throat, sores in mouth, or a toothache) UNUSUAL RASH, SWELLING OR PAIN  UNUSUAL VAGINAL DISCHARGE OR ITCHING   Items with * indicate a potential emergency and should be followed up as soon as possible or go to the Emergency Department if any problems should occur.  Please show the CHEMOTHERAPY ALERT CARD or IMMUNOTHERAPY ALERT CARD at check-in to the Emergency Department and triage nurse.  Should you have questions after your visit or need to cancel or reschedule your appointment, please contact [facility] [phone]  and follow the prompts.  Office hours are 8:00 a.m. to 4:30 p.m. Monday - Friday. Please note that voicemails left after 4:00 p.m. may not be returned until the following business day.  We are closed weekends and major holidays. You have access to a nurse at all times for urgent questions. Please call the main number to the clinic [phone] and follow the prompts.  For any non-urgent questions, you may also contact your provider using MyChart. We now offer e-Visits for anyone 42 and older to request care online for non-urgent symptoms. For details visit [email or web address].   Also download the MyChart app! Go to the app store, search "MyChart", open the app, select [facility], and log in with your MyChart username and password.

## 2022-06-20 ENCOUNTER — Inpatient Hospital Stay: Payer: Medicare Other | Admitting: Licensed Clinical Social Worker

## 2022-06-20 ENCOUNTER — Other Ambulatory Visit: Payer: Medicare Other

## 2022-06-20 ENCOUNTER — Inpatient Hospital Stay: Payer: Medicare Other

## 2022-06-20 DIAGNOSIS — C9000 Multiple myeloma not having achieved remission: Secondary | ICD-10-CM

## 2022-06-20 NOTE — Progress Notes (Signed)
[facility] CSW Progress Note  Clinical Education officer, museum contacted patient by phone to address concerns surrounding meal delivery.  Pt's home phone was answered by a friend.  Per pt's friend, pt's spouse suffers from dementia and is not capable of following through with organizing pt's appointments or securing necessary services.  CSW had spoken w/ pt's spouse in December regarding meal delivery as the [employer] program ran out of funding and pt was sent a letter to register with Meals on Wheels.  Pt's friend informed CSW that pt's spouse has a care taker Monday through Friday from 9:00 to 2:00 Wendie Agreste [phone]) who would be best able to facilitate meal delivery.  CSW provided pt's friend w/ the contact number for Loma Sousa at [employer] to arrange for meal delivery.  CSW to also follow up w/ Baldo Ash to explain pt's situation.      Henriette Combs, LCSW

## 2022-06-21 ENCOUNTER — Inpatient Hospital Stay: Payer: Medicare Other

## 2022-06-21 ENCOUNTER — Inpatient Hospital Stay: Payer: Medicare Other | Admitting: Licensed Clinical Social Worker

## 2022-06-21 DIAGNOSIS — Z5112 Encounter for antineoplastic immunotherapy: Secondary | ICD-10-CM | POA: Diagnosis not present

## 2022-06-21 DIAGNOSIS — C9000 Multiple myeloma not having achieved remission: Secondary | ICD-10-CM

## 2022-06-21 DIAGNOSIS — D630 Anemia in neoplastic disease: Secondary | ICD-10-CM | POA: Diagnosis not present

## 2022-06-21 DIAGNOSIS — D6481 Anemia due to antineoplastic chemotherapy: Secondary | ICD-10-CM | POA: Diagnosis not present

## 2022-06-21 DIAGNOSIS — E559 Vitamin D deficiency, unspecified: Secondary | ICD-10-CM | POA: Diagnosis not present

## 2022-06-21 MED ORDER — HEPARIN SOD (PORK) LOCK FLUSH 100 UNIT/ML IV SOLN
500.0000 [IU] | Freq: Every day | INTRAVENOUS | Status: AC | PRN
  Administered 2022-06-21: 500 [IU]

## 2022-06-21 MED ORDER — SODIUM CHLORIDE 0.9% FLUSH
10.0000 mL | INTRAVENOUS | Status: AC | PRN
  Administered 2022-06-21: 10 mL

## 2022-06-21 MED ORDER — SODIUM CHLORIDE 0.9% IV SOLUTION
250.0000 mL | Freq: Once | INTRAVENOUS | Status: AC
  Administered 2022-06-21: 250 mL via INTRAVENOUS

## 2022-06-21 MED ORDER — ACETAMINOPHEN 325 MG PO TABS
650.0000 mg | ORAL_TABLET | Freq: Once | ORAL | Status: AC
  Administered 2022-06-21: 650 mg via ORAL
  Filled 2022-06-21: qty 2

## 2022-06-21 MED ORDER — DIPHENHYDRAMINE HCL 25 MG PO CAPS
25.0000 mg | ORAL_CAPSULE | Freq: Once | ORAL | Status: AC
  Administered 2022-06-21: 25 mg via ORAL
  Filled 2022-06-21: qty 1

## 2022-06-21 NOTE — Progress Notes (Signed)
[facility] CSW Progress Note  Clinical Education officer, museum  spoke w/ Loma Sousa regarding Meals on Wheels for pt.  Per Baldo Ash pt has been on the wait list for meals since the beginning of December.  CSW explained pt's home situation and limited resources to advocate to advance pt up the wait list.  Baldo Ash to speak w/ her superior to see if pt can be moved up the wait list and will inform CSW once this has been determined.        Henriette Combs, LCSW

## 2022-06-21 NOTE — Patient Instructions (Signed)
[place]  Discharge Instructions: Thank you for choosing [facility] to provide your oncology and hematology care.  If you have a lab appointment with the [facility], please come in thru the Main Entrance and check in at the main information desk.  Wear comfortable clothing and clothing appropriate for easy access to any Portacath or PICC line.   We strive to give you quality time with your provider. You may need to reschedule your appointment if you arrive late (15 or more minutes).  Arriving late affects you and other patients whose appointments are after yours.  Also, if you miss three or more appointments without notifying the office, you may be dismissed from the clinic at the provider's discretion.      For prescription refill requests, have your pharmacy contact our office and allow 72 hours for refills to be completed.    Today you received the following chemotherapy and/or immunotherapy agents 1 unit of blood. Blood Transfusion, Adult, Care After The following information offers guidance on how to care for yourself after your procedure. Your health care provider may also give you more specific instructions. If you have problems or questions, contact your health care provider. What can I expect after the procedure? After the procedure, it is common to have: Bruising and soreness where the IV was inserted. A headache. Follow these instructions at home: IV insertion site care     Follow instructions from your health care provider about how to take care of your IV insertion site. Make sure you: Wash your hands with soap and water for at least 20 seconds before and after you change your bandage (dressing). If soap and water are not available, use hand sanitizer. Change your dressing as told by your health care provider. Check your IV insertion site every day for signs of infection. Check for: Redness, swelling, or pain. Bleeding from the  site. Warmth. Pus or a bad smell. General instructions Take over-the-counter and prescription medicines only as told by your health care provider. Rest as told by your health care provider. Return to your normal activities as told by your health care provider. Keep all follow-up visits. Lab tests may need to be done at certain periods to recheck your blood counts. Contact a health care provider if: You have itching or red, swollen areas of skin (hives). You have a fever or chills. You have pain in the head, back, or chest. You feel anxious or you feel weak after doing your normal activities. You have redness, swelling, warmth, or pain around the IV insertion site. You have blood coming from the IV insertion site that does not stop with pressure. You have pus or a bad smell coming from your IV insertion site. If you received your blood transfusion in an outpatient setting, you will be told whom to contact to report any reactions. Get help right away if: You have symptoms of a serious allergic or immune system reaction, including: Trouble breathing or shortness of breath. Swelling of the face, feeling flushed, or widespread rash. Dark urine or blood in the urine. Fast heartbeat. These symptoms may be an emergency. Get help right away. Call 911. Do not wait to see if the symptoms will go away. Do not drive yourself to the hospital. Summary Bruising and soreness around the IV insertion site are common. Check your IV insertion site every day for signs of infection. Rest as told by your health care provider. Return to your normal activities  as told by your health care provider. Get help right away for symptoms of a serious allergic or immune system reaction to the blood transfusion. This information is not intended to replace advice given to you by your health care provider. Make sure you discuss any questions you have with your health care provider. Document Revised: 07/20/2021 Document  Reviewed: 07/20/2021 Elsevier Patient Education  [place].       To help prevent nausea and vomiting after your treatment, we encourage you to take your nausea medication as directed.  BELOW ARE SYMPTOMS THAT SHOULD BE REPORTED IMMEDIATELY: *FEVER GREATER THAN 100.4 F (38 C) OR HIGHER *CHILLS OR SWEATING *NAUSEA AND VOMITING THAT IS NOT CONTROLLED WITH YOUR NAUSEA MEDICATION *UNUSUAL SHORTNESS OF BREATH *UNUSUAL BRUISING OR BLEEDING *URINARY PROBLEMS (pain or burning when urinating, or frequent urination) *BOWEL PROBLEMS (unusual diarrhea, constipation, pain near the anus) TENDERNESS IN MOUTH AND THROAT WITH OR WITHOUT PRESENCE OF ULCERS (sore throat, sores in mouth, or a toothache) UNUSUAL RASH, SWELLING OR PAIN  UNUSUAL VAGINAL DISCHARGE OR ITCHING   Items with * indicate a potential emergency and should be followed up as soon as possible or go to the Emergency Department if any problems should occur.  Please show the CHEMOTHERAPY ALERT CARD or IMMUNOTHERAPY ALERT CARD at check-in to the Emergency Department and triage nurse.  Should you have questions after your visit or need to cancel or reschedule your appointment, please contact [facility] [phone]  and follow the prompts.  Office hours are 8:00 a.m. to 4:30 p.m. Monday - Friday. Please note that voicemails left after 4:00 p.m. may not be returned until the following business day.  We are closed weekends and major holidays. You have access to a nurse at all times for urgent questions. Please call the main number to the clinic [phone] and follow the prompts.  For any non-urgent questions, you may also contact your provider using MyChart. We now offer e-Visits for anyone 24 and older to request care online for non-urgent symptoms. For details visit [email or web address].   Also download the MyChart app! Go to the app store, search "MyChart", open the app, select [facility], and log in with  your MyChart username and password.

## 2022-06-21 NOTE — Progress Notes (Signed)
Patient presents today for 1 unit of PRBC's. Vital signs are stable. Patient denies any shortness of breath, fatigue or dizziness. MAR reviewed and updated.   1 unit of blood given today per MD orders. Tolerated infusion without adverse affects. Vital signs stable. No complaints at this time. Discharged from clinic ambulatory in stable condition. Alert and oriented x 3. F/U with [facility] as scheduled.

## 2022-06-22 ENCOUNTER — Other Ambulatory Visit: Payer: Self-pay | Admitting: Hematology

## 2022-06-22 DIAGNOSIS — C9000 Multiple myeloma not having achieved remission: Secondary | ICD-10-CM

## 2022-06-23 LAB — TYPE AND SCREEN
ABO/RH(D): O POS
Antibody Screen: POSITIVE
DAT, IgG: NEGATIVE
Unit division: 0

## 2022-06-24 ENCOUNTER — Other Ambulatory Visit: Payer: Self-pay

## 2022-06-24 DIAGNOSIS — I110 Hypertensive heart disease with heart failure: Secondary | ICD-10-CM | POA: Diagnosis not present

## 2022-06-24 DIAGNOSIS — E46 Unspecified protein-calorie malnutrition: Secondary | ICD-10-CM | POA: Diagnosis not present

## 2022-06-24 DIAGNOSIS — C9000 Multiple myeloma not having achieved remission: Secondary | ICD-10-CM

## 2022-06-24 DIAGNOSIS — I509 Heart failure, unspecified: Secondary | ICD-10-CM | POA: Diagnosis not present

## 2022-06-24 DIAGNOSIS — E871 Hypo-osmolality and hyponatremia: Secondary | ICD-10-CM | POA: Diagnosis not present

## 2022-06-26 ENCOUNTER — Other Ambulatory Visit: Payer: Self-pay | Admitting: *Deleted

## 2022-06-26 ENCOUNTER — Other Ambulatory Visit: Payer: Self-pay

## 2022-06-26 ENCOUNTER — Inpatient Hospital Stay: Payer: Medicare Other

## 2022-06-26 ENCOUNTER — Other Ambulatory Visit: Payer: Self-pay | Admitting: Hematology

## 2022-06-26 VITALS — BP 137/59 | HR 73 | Temp 98.1°F | Resp 18

## 2022-06-26 VITALS — BP 177/61 | HR 67 | Temp 96.9°F | Resp 18 | Wt 129.6 lb

## 2022-06-26 DIAGNOSIS — D6481 Anemia due to antineoplastic chemotherapy: Secondary | ICD-10-CM | POA: Diagnosis not present

## 2022-06-26 DIAGNOSIS — E559 Vitamin D deficiency, unspecified: Secondary | ICD-10-CM | POA: Diagnosis not present

## 2022-06-26 DIAGNOSIS — Z95828 Presence of other vascular implants and grafts: Secondary | ICD-10-CM

## 2022-06-26 DIAGNOSIS — D630 Anemia in neoplastic disease: Secondary | ICD-10-CM | POA: Diagnosis not present

## 2022-06-26 DIAGNOSIS — Z5112 Encounter for antineoplastic immunotherapy: Secondary | ICD-10-CM | POA: Diagnosis not present

## 2022-06-26 DIAGNOSIS — C9000 Multiple myeloma not having achieved remission: Secondary | ICD-10-CM

## 2022-06-26 LAB — CBC WITH DIFFERENTIAL/PLATELET
Abs Immature Granulocytes: 0.03 10*3/uL (ref 0.00–0.07)
Basophils Absolute: 0 10*3/uL (ref 0.0–0.1)
Basophils Relative: 0 %
Eosinophils Absolute: 0.1 10*3/uL (ref 0.0–0.5)
Eosinophils Relative: 3 %
HCT: 27.1 % — ABNORMAL LOW (ref 39.0–52.0)
Hemoglobin: 8.9 g/dL — ABNORMAL LOW (ref 13.0–17.0)
Immature Granulocytes: 1 %
Lymphocytes Relative: 16 %
Lymphs Abs: 0.5 10*3/uL — ABNORMAL LOW (ref 0.7–4.0)
MCH: 34.1 pg — ABNORMAL HIGH (ref 26.0–34.0)
MCHC: 32.8 g/dL (ref 30.0–36.0)
MCV: 103.8 fL — ABNORMAL HIGH (ref 80.0–100.0)
Monocytes Absolute: 0.5 10*3/uL (ref 0.1–1.0)
Monocytes Relative: 17 %
Neutro Abs: 2.1 10*3/uL (ref 1.7–7.7)
Neutrophils Relative %: 63 %
Platelets: 76 10*3/uL — ABNORMAL LOW (ref 150–400)
RBC: 2.61 MIL/uL — ABNORMAL LOW (ref 4.22–5.81)
RDW: 26 % — ABNORMAL HIGH (ref 11.5–15.5)
WBC: 3.3 10*3/uL — ABNORMAL LOW (ref 4.0–10.5)
nRBC: 0 % (ref 0.0–0.2)

## 2022-06-26 LAB — COMPREHENSIVE METABOLIC PANEL
ALT: 50 U/L — ABNORMAL HIGH (ref 0–44)
AST: 24 U/L (ref 15–41)
Albumin: 2.5 g/dL — ABNORMAL LOW (ref 3.5–5.0)
Alkaline Phosphatase: 58 U/L (ref 38–126)
Anion gap: 6 (ref 5–15)
BUN: 17 mg/dL (ref 8–23)
CO2: 23 mmol/L (ref 22–32)
Calcium: 8 mg/dL — ABNORMAL LOW (ref 8.9–10.3)
Chloride: 100 mmol/L (ref 98–111)
Creatinine, Ser: 0.71 mg/dL (ref 0.61–1.24)
GFR, Estimated: >60 mL/min (ref >60)
Glucose, Bld: 105 mg/dL — ABNORMAL HIGH (ref 70–99)
Potassium: 3.4 mmol/L — ABNORMAL LOW (ref 3.5–5.1)
Sodium: 129 mmol/L — ABNORMAL LOW (ref 135–145)
Total Bilirubin: 0.6 mg/dL (ref 0.3–1.2)
Total Protein: 7.8 g/dL (ref 6.5–8.1)

## 2022-06-26 LAB — SAMPLE TO BLOOD BANK

## 2022-06-26 LAB — PHOSPHORUS: Phosphorus: 2.6 mg/dL (ref 2.5–4.6)

## 2022-06-26 LAB — MAGNESIUM: Magnesium: 1.9 mg/dL (ref 1.7–2.4)

## 2022-06-26 MED ORDER — HEPARIN SOD (PORK) LOCK FLUSH 100 UNIT/ML IV SOLN
500.0000 [IU] | Freq: Once | INTRAVENOUS | Status: AC | PRN
  Administered 2022-06-26: 500 [IU]

## 2022-06-26 MED ORDER — SODIUM CHLORIDE 0.9 % IV SOLN
16.0000 mg/kg | Freq: Once | INTRAVENOUS | Status: AC
  Administered 2022-06-26: 900 mg via INTRAVENOUS
  Filled 2022-06-26: qty 45

## 2022-06-26 MED ORDER — SODIUM CHLORIDE 0.9% FLUSH
10.0000 mL | INTRAVENOUS | Status: DC | PRN
  Administered 2022-06-26: 10 mL

## 2022-06-26 MED ORDER — METHYLPREDNISOLONE SODIUM SUCC 125 MG IJ SOLR
100.0000 mg | Freq: Once | INTRAMUSCULAR | Status: AC
  Administered 2022-06-26: 100 mg via INTRAVENOUS
  Filled 2022-06-26: qty 2

## 2022-06-26 MED ORDER — SODIUM CHLORIDE 0.9 % IV SOLN: Freq: Once | INTRAVENOUS | Status: AC

## 2022-06-26 MED ORDER — ACETAMINOPHEN 325 MG PO TABS
650.0000 mg | ORAL_TABLET | Freq: Once | ORAL | Status: AC
  Administered 2022-06-26: 650 mg via ORAL
  Filled 2022-06-26: qty 2

## 2022-06-26 MED ORDER — BORTEZOMIB CHEMO SQ INJECTION 3.5 MG (2.5MG/ML)
1.0000 mg/m2 | Freq: Once | INTRAMUSCULAR | Status: AC
  Administered 2022-06-26: 1.75 mg via SUBCUTANEOUS
  Filled 2022-06-26: qty 0.7

## 2022-06-26 MED ORDER — SODIUM CHLORIDE 0.9% FLUSH
10.0000 mL | INTRAVENOUS | Status: DC | PRN
  Administered 2022-06-26: 10 mL via INTRAVENOUS

## 2022-06-26 MED ORDER — ONDANSETRON HCL 4 MG/2ML IJ SOLN
8.0000 mg | Freq: Once | INTRAMUSCULAR | Status: AC
  Administered 2022-06-26: 8 mg via INTRAVENOUS
  Filled 2022-06-26: qty 4

## 2022-06-26 MED ORDER — FUROSEMIDE 20 MG PO TABS: 20.0000 mg | ORAL_TABLET | ORAL | 0 refills | Status: DC | PRN

## 2022-06-26 MED ORDER — CETIRIZINE HCL 10 MG/ML IV SOLN
10.0000 mg | Freq: Once | INTRAVENOUS | Status: AC
  Administered 2022-06-26: 10 mg via INTRAVENOUS
  Filled 2022-06-26: qty 1

## 2022-06-26 NOTE — Telephone Encounter (Signed)
Patient came into clinic requesting something to be sent in for his legs swelling. Message sent to Dr. Delton Coombes.   Message from Dr. Delton Coombes- please give lasix 44m daily prn #30, to take in am   Lasix 20 mg daily prn #30 to take in the am [facility] Dr. KTomie Chinaorder. Patient aware and agreeable with plan.

## 2022-06-26 NOTE — Progress Notes (Signed)
Patients port flushed without difficulty.  Good blood return noted with no bruising or swelling noted at site.  Patient remains accessed for chemotherapy treatment.  

## 2022-06-26 NOTE — Patient Instructions (Signed)
[facility]  Discharge Instructions: Thank you for choosing [facility] to provide your oncology and hematology care.  If you have a lab appointment with the [facility], please come in thru the Main Entrance and check in at the main information desk.  Wear comfortable clothing and clothing appropriate for easy access to any Portacath or PICC line.   We strive to give you quality time with your provider. You may need to reschedule your appointment if you arrive late (15 or more minutes).  Arriving late affects you and other patients whose appointments are after yours.  Also, if you miss three or more appointments without notifying the office, you may be dismissed from the clinic at the provider's discretion.      For prescription refill requests, have your pharmacy contact our office and allow 72 hours for refills to be completed.    Today you received the following chemotherapy and/or immunotherapy agents Dara IV and Velcade     To help prevent nausea and vomiting after your treatment, we encourage you to take your nausea medication as directed.  Daratumumab Injection What is this medication? DARATUMUMAB (dar a toom ue mab) treats multiple myeloma, a type of bone marrow cancer. It works by helping your immune system slow or stop the spread of cancer cells. It is a monoclonal antibody. This medicine may be used for other purposes; ask your health care provider or pharmacist if you have questions. COMMON BRAND NAME(S): DARZALEX What should I tell my care team before I take this medication? They need to know if you have any of these conditions: Hereditary fructose intolerance Infection, such as chickenpox, herpes, hepatitis B virus Lung or breathing disease, such as asthma, COPD An unusual or allergic reaction to daratumumab, sorbitol, other medications, foods, dyes, or preservatives Pregnant or trying to get pregnant Breast-feeding How should I use this  medication? This medication is injected into a vein. It is given by your care team in a hospital or clinic setting. Talk to your care team about the use of this medication in children. Special care may be needed. Overdosage: If you think you have taken too much of this medicine contact a poison control center or emergency room at once. NOTE: This medicine is only for you. Do not share this medicine with others. What if I miss a dose? Keep appointments for follow-up doses. It is important not to miss your dose. Call your care team if you are unable to keep an appointment. What may interact with this medication? Interactions have not been studied. This list may not describe all possible interactions. Give your health care provider a list of all the medicines, herbs, non-prescription drugs, or dietary supplements you use. Also tell them if you smoke, drink alcohol, or use illegal drugs. Some items may interact with your medicine. What should I watch for while using this medication? Your condition will be monitored carefully while you are receiving this medication. This medication can cause serious allergic reactions. To reduce your risk, your care team may give you other medication to take before receiving this one. Be sure to follow the directions from your care team. This medication can affect the results of blood tests to match your blood type. These changes can last for up to 6 months after the final dose. Your care team will do blood tests to match your blood type before you start treatment. Tell all of your care team that you are being treated with  this medication before receiving a blood transfusion. This medication can affect the results of some tests used to determine treatment response; extra tests may be needed to evaluate response. Talk to your care team if you wish to become pregnant or think you are pregnant. This medication can cause serious birth defects if taken during pregnancy and for  3 months after the last dose. A reliable form of contraception is recommended while taking this medication and for 3 months after the last dose. Talk to your care team about effective forms of contraception. Do not breast-feed while taking this medication. What side effects may I notice from receiving this medication? Side effects that you should report to your care team as soon as possible: Allergic reactions--skin rash, itching, hives, swelling of the face, lips, tongue, or throat Infection--fever, chills, cough, sore throat, wounds that don't heal, pain or trouble when passing urine, general feeling of discomfort or being unwell Infusion reactions--chest pain, shortness of breath or trouble breathing, feeling faint or lightheaded Unusual bruising or bleeding Side effects that usually do not require medical attention (report to your care team if they continue or are bothersome): Constipation Diarrhea Fatigue Nausea Pain, tingling, or numbness in the hands or feet Swelling of the ankles, hands, or feet This list may not describe all possible side effects. Call your doctor for medical advice about side effects. You may report side effects to FDA at 1-800-FDA-1088. Where should I keep my medication? This medication is given in a hospital or clinic. It will not be stored at home. NOTE: This sheet is a summary. It may not cover all possible information. If you have questions about this medicine, talk to your doctor, pharmacist, or health care provider.  2023 Elsevier/Gold Standard (2021-08-15 00:00:00)  Bortezomib Injection What is this medication? BORTEZOMIB (bor TEZ oh mib) treats lymphoma. It may also be used to treat multiple myeloma, a type of bone marrow cancer. It works by blocking a protein that causes cancer cells to grow and multiply. This helps to slow or stop the spread of cancer cells. This medicine may be used for other purposes; ask your health care provider or pharmacist if you  have questions. COMMON BRAND NAME(S): Velcade What should I tell my care team before I take this medication? They need to know if you have any of these conditions: Dehydration Diabetes Heart disease Liver disease Tingling of the fingers or toes or other nerve disorder An unusual or allergic reaction to bortezomib, other medications, foods, dyes, or preservatives If you or your partner are pregnant or trying to get pregnant Breastfeeding How should I use this medication? This medication is injected into a vein or under the skin. It is given by your care team in a hospital or clinic setting. Talk to your care team about the use of this medication in children. Special care may be needed. Overdosage: If you think you have taken too much of this medicine contact a poison control center or emergency room at once. NOTE: This medicine is only for you. Do not share this medicine with others. What if I miss a dose? Keep appointments for follow-up doses. It is important not to miss your dose. Call your care team if you are unable to keep an appointment. What may interact with this medication? Ketoconazole Rifampin This list may not describe all possible interactions. Give your health care provider a list of all the medicines, herbs, non-prescription drugs, or dietary supplements you use. Also tell them if you smoke,  drink alcohol, or use illegal drugs. Some items may interact with your medicine. What should I watch for while using this medication? Your condition will be monitored carefully while you are receiving this medication. You may need blood work while taking this medication. This medication may affect your coordination, reaction time, or judgment. Do not drive or operate machinery until you know how this medication affects you. Sit up or stand slowly to reduce the risk of dizzy or fainting spells. Drinking alcohol with this medication can increase the risk of these side effects. This  medication may increase your risk of getting an infection. Call your care team for advice if you get a fever, chills, sore throat, or other symptoms of a cold or flu. Do not treat yourself. Try to avoid being around people who are sick. Check with your care team if you have severe diarrhea, nausea, and vomiting, or if you sweat a lot. The loss of too much body fluid may make it dangerous for you to take this medication. Talk to your care team if you may be pregnant. Serious birth defects can occur if you take this medication during pregnancy and for 7 months after the last dose. You will need a negative pregnancy test before starting this medication. Contraception is recommended while taking this medication and for 7 months after the last dose. Your care team can help you find the option that works for you. If your partner can get pregnant, use a condom during sex while taking this medication and for 4 months after the last dose. Do not breastfeed while taking this medication and for 2 months after the last dose. This medication may cause infertility. Talk to your care team if you are concerned about your fertility. What side effects may I notice from receiving this medication? Side effects that you should report to your care team as soon as possible: Allergic reactions--skin rash, itching, hives, swelling of the face, lips, tongue, or throat Bleeding--bloody or black, tar-like stools, vomiting blood or brown material that looks like coffee grounds, red or dark brown urine, small red or purple spots on skin, unusual bruising or bleeding Bleeding in the brain--severe headache, stiff neck, confusion, dizziness, change in vision, numbness or weakness of the face, arm, or leg, trouble speaking, trouble walking, vomiting Bowel blockage--stomach cramping, unable to have a bowel movement or pass gas, loss of appetite, vomiting Heart failure--shortness of breath, swelling of the ankles, feet, or hands, sudden  weight gain, unusual weakness or fatigue Infection--fever, chills, cough, sore throat, wounds that don't heal, pain or trouble when passing urine, general feeling of discomfort or being unwell Liver injury--right upper belly pain, loss of appetite, nausea, light-colored stool, dark yellow or brown urine, yellowing skin or eyes, unusual weakness or fatigue Low blood pressure--dizziness, feeling faint or lightheaded, blurry vision Lung injury--shortness of breath or trouble breathing, cough, spitting up blood, chest pain, fever Pain, tingling, or numbness in the hands or feet Severe or prolonged diarrhea Stomach pain, bloody diarrhea, pale skin, unusual weakness or fatigue, decrease in the amount of urine, which may be signs of hemolytic uremic syndrome Sudden and severe headache, confusion, change in vision, seizures, which may be signs of posterior reversible encephalopathy syndrome (PRES) TTP--purple spots on the skin or inside the mouth, pale skin, yellowing skin or eyes, unusual weakness or fatigue, fever, fast or irregular heartbeat, confusion, change in vision, trouble speaking, trouble walking Tumor lysis syndrome (TLS)--nausea, vomiting, diarrhea, decrease in the amount of urine, dark urine,  unusual weakness or fatigue, confusion, muscle pain or cramps, fast or irregular heartbeat, joint pain Side effects that usually do not require medical attention (report to your care team if they continue or are bothersome): Constipation Diarrhea Fatigue Loss of appetite Nausea This list may not describe all possible side effects. Call your doctor for medical advice about side effects. You may report side effects to FDA at 1-800-FDA-1088. Where should I keep my medication? This medication is given in a hospital or clinic. It will not be stored at home. NOTE: This sheet is a summary. It may not cover all possible information. If you have questions about this medicine, talk to your doctor, pharmacist, or  health care provider.  2023 Elsevier/Gold Standard (2021-09-19 00:00:00)      BELOW ARE SYMPTOMS THAT SHOULD BE REPORTED IMMEDIATELY: *FEVER GREATER THAN 100.4 F (38 C) OR HIGHER *CHILLS OR SWEATING *NAUSEA AND VOMITING THAT IS NOT CONTROLLED WITH YOUR NAUSEA MEDICATION *UNUSUAL SHORTNESS OF BREATH *UNUSUAL BRUISING OR BLEEDING *URINARY PROBLEMS (pain or burning when urinating, or frequent urination) *BOWEL PROBLEMS (unusual diarrhea, constipation, pain near the anus) TENDERNESS IN MOUTH AND THROAT WITH OR WITHOUT PRESENCE OF ULCERS (sore throat, sores in mouth, or a toothache) UNUSUAL RASH, SWELLING OR PAIN  UNUSUAL VAGINAL DISCHARGE OR ITCHING   Items with * indicate a potential emergency and should be followed up as soon as possible or go to the Emergency Department if any problems should occur.  Please show the CHEMOTHERAPY ALERT CARD or IMMUNOTHERAPY ALERT CARD at check-in to the Emergency Department and triage nurse.  Should you have questions after your visit or need to cancel or reschedule your appointment, please contact [facility] [phone]  and follow the prompts.  Office hours are 8:00 a.m. to 4:30 p.m. Monday - Friday. Please note that voicemails left after 4:00 p.m. may not be returned until the following business day.  We are closed weekends and major holidays. You have access to a nurse at all times for urgent questions. Please call the main number to the clinic [phone] and follow the prompts.  For any non-urgent questions, you may also contact your provider using MyChart. We now offer e-Visits for anyone 8 and older to request care online for non-urgent symptoms. For details visit [email or web address].   Also download the MyChart app! Go to the app store, search "MyChart", open the app, select [facility], and log in with your MyChart username and password.

## 2022-06-26 NOTE — Progress Notes (Signed)
Pt presents today Dara IV and Velcade per provider's order. Vital signs and other labs WNL for treatment. Pt's platelets are 76 today, Dr.K made aware and stated pt is okay to proceed with treatment today.   Dara IV and Velcade given today per MD orders. Tolerated infusion without adverse affects. Vital signs stable. No complaints at this time. Discharged from clinic ambulatory in stable condition. Alert and oriented x 3. F/U with [facility] as scheduled.

## 2022-06-27 ENCOUNTER — Ambulatory Visit: Payer: Medicare Other

## 2022-06-27 ENCOUNTER — Ambulatory Visit: Payer: Medicare Other | Admitting: Hematology

## 2022-06-27 ENCOUNTER — Other Ambulatory Visit: Payer: Medicare Other

## 2022-06-27 DIAGNOSIS — I110 Hypertensive heart disease with heart failure: Secondary | ICD-10-CM | POA: Diagnosis not present

## 2022-06-27 DIAGNOSIS — C9000 Multiple myeloma not having achieved remission: Secondary | ICD-10-CM | POA: Diagnosis not present

## 2022-06-27 DIAGNOSIS — E46 Unspecified protein-calorie malnutrition: Secondary | ICD-10-CM | POA: Diagnosis not present

## 2022-06-27 DIAGNOSIS — E871 Hypo-osmolality and hyponatremia: Secondary | ICD-10-CM | POA: Diagnosis not present

## 2022-06-27 DIAGNOSIS — I509 Heart failure, unspecified: Secondary | ICD-10-CM | POA: Diagnosis not present

## 2022-06-28 ENCOUNTER — Inpatient Hospital Stay: Payer: Medicare Other

## 2022-06-29 DIAGNOSIS — I110 Hypertensive heart disease with heart failure: Secondary | ICD-10-CM | POA: Diagnosis not present

## 2022-06-29 DIAGNOSIS — E871 Hypo-osmolality and hyponatremia: Secondary | ICD-10-CM | POA: Diagnosis not present

## 2022-06-29 DIAGNOSIS — C9000 Multiple myeloma not having achieved remission: Secondary | ICD-10-CM | POA: Diagnosis not present

## 2022-06-29 DIAGNOSIS — I509 Heart failure, unspecified: Secondary | ICD-10-CM | POA: Diagnosis not present

## 2022-06-29 DIAGNOSIS — E46 Unspecified protein-calorie malnutrition: Secondary | ICD-10-CM | POA: Diagnosis not present

## 2022-07-03 ENCOUNTER — Inpatient Hospital Stay: Payer: Medicare Other

## 2022-07-03 VITALS — BP 135/57 | HR 65 | Temp 98.0°F | Resp 16

## 2022-07-03 DIAGNOSIS — E559 Vitamin D deficiency, unspecified: Secondary | ICD-10-CM | POA: Diagnosis not present

## 2022-07-03 DIAGNOSIS — Z5112 Encounter for antineoplastic immunotherapy: Secondary | ICD-10-CM | POA: Diagnosis not present

## 2022-07-03 DIAGNOSIS — Z95828 Presence of other vascular implants and grafts: Secondary | ICD-10-CM

## 2022-07-03 DIAGNOSIS — D6481 Anemia due to antineoplastic chemotherapy: Secondary | ICD-10-CM | POA: Diagnosis not present

## 2022-07-03 DIAGNOSIS — C9000 Multiple myeloma not having achieved remission: Secondary | ICD-10-CM | POA: Diagnosis not present

## 2022-07-03 DIAGNOSIS — D630 Anemia in neoplastic disease: Secondary | ICD-10-CM | POA: Diagnosis not present

## 2022-07-03 LAB — CBC WITH DIFFERENTIAL/PLATELET
Abs Immature Granulocytes: 0.02 10*3/uL (ref 0.00–0.07)
Basophils Absolute: 0 10*3/uL (ref 0.0–0.1)
Basophils Relative: 0 %
Eosinophils Absolute: 0.1 10*3/uL (ref 0.0–0.5)
Eosinophils Relative: 2 %
HCT: 24.1 % — ABNORMAL LOW (ref 39.0–52.0)
Hemoglobin: 7.8 g/dL — ABNORMAL LOW (ref 13.0–17.0)
Immature Granulocytes: 1 %
Lymphocytes Relative: 15 %
Lymphs Abs: 0.6 10*3/uL — ABNORMAL LOW (ref 0.7–4.0)
MCH: 33.9 pg (ref 26.0–34.0)
MCHC: 32.4 g/dL (ref 30.0–36.0)
MCV: 104.8 fL — ABNORMAL HIGH (ref 80.0–100.0)
Monocytes Absolute: 0.7 10*3/uL (ref 0.1–1.0)
Monocytes Relative: 18 %
Neutro Abs: 2.5 10*3/uL (ref 1.7–7.7)
Neutrophils Relative %: 64 %
Platelets: 77 10*3/uL — ABNORMAL LOW (ref 150–400)
RBC: 2.3 MIL/uL — ABNORMAL LOW (ref 4.22–5.81)
RDW: 25.2 % — ABNORMAL HIGH (ref 11.5–15.5)
WBC: 3.8 10*3/uL — ABNORMAL LOW (ref 4.0–10.5)
nRBC: 0 % (ref 0.0–0.2)

## 2022-07-03 LAB — COMPREHENSIVE METABOLIC PANEL
ALT: 31 U/L (ref 0–44)
AST: 18 U/L (ref 15–41)
Albumin: 2.4 g/dL — ABNORMAL LOW (ref 3.5–5.0)
Alkaline Phosphatase: 53 U/L (ref 38–126)
Anion gap: 2 — ABNORMAL LOW (ref 5–15)
BUN: 17 mg/dL (ref 8–23)
CO2: 24 mmol/L (ref 22–32)
Calcium: 8 mg/dL — ABNORMAL LOW (ref 8.9–10.3)
Chloride: 103 mmol/L (ref 98–111)
Creatinine, Ser: 0.68 mg/dL (ref 0.61–1.24)
GFR, Estimated: >60 mL/min (ref >60)
Glucose, Bld: 98 mg/dL (ref 70–99)
Potassium: 3.7 mmol/L (ref 3.5–5.1)
Sodium: 129 mmol/L — ABNORMAL LOW (ref 135–145)
Total Bilirubin: 0.5 mg/dL (ref 0.3–1.2)
Total Protein: 7.5 g/dL (ref 6.5–8.1)

## 2022-07-03 LAB — SAMPLE TO BLOOD BANK

## 2022-07-03 LAB — PHOSPHORUS: Phosphorus: 2.5 mg/dL (ref 2.5–4.6)

## 2022-07-03 LAB — PREPARE RBC (CROSSMATCH)

## 2022-07-03 LAB — MAGNESIUM: Magnesium: 2.1 mg/dL (ref 1.7–2.4)

## 2022-07-03 MED ORDER — HEPARIN SOD (PORK) LOCK FLUSH 100 UNIT/ML IV SOLN
500.0000 [IU] | Freq: Once | INTRAVENOUS | Status: AC
  Administered 2022-07-03: 500 [IU] via INTRAVENOUS

## 2022-07-03 MED ORDER — SODIUM CHLORIDE 0.9% FLUSH
10.0000 mL | Freq: Once | INTRAVENOUS | Status: AC
  Administered 2022-07-03: 10 mL via INTRAVENOUS

## 2022-07-03 NOTE — Progress Notes (Signed)
Port flushed with good blood return noted, labs drawn. No bruising or swelling at site. Bandaid applied and patient discharged in satisfactory condition. VVS stable with no signs or symptoms of distressed noted. 

## 2022-07-03 NOTE — Progress Notes (Signed)
Hemoglobin is 7.8 today. Will order one unit of blood today per MD orders.

## 2022-07-03 NOTE — Addendum Note (Signed)
Addended by: Donnie Aho on: 07/03/2022 09:34 AM   Modules accepted: Orders

## 2022-07-04 ENCOUNTER — Encounter: Payer: Self-pay | Admitting: Radiology

## 2022-07-04 ENCOUNTER — Ambulatory Visit: Payer: Medicare Other | Admitting: Hematology

## 2022-07-04 ENCOUNTER — Ambulatory Visit: Payer: Medicare Other

## 2022-07-04 ENCOUNTER — Other Ambulatory Visit: Payer: Medicare Other

## 2022-07-04 LAB — KAPPA/LAMBDA LIGHT CHAINS
Kappa free light chain: 5.5 mg/L (ref 3.3–19.4)
Kappa, lambda light chain ratio: 0.01 — ABNORMAL LOW (ref 0.26–1.65)
Lambda free light chains: 488.7 mg/L — ABNORMAL HIGH (ref 5.7–26.3)

## 2022-07-05 ENCOUNTER — Other Ambulatory Visit: Payer: Self-pay | Admitting: Physician Assistant

## 2022-07-05 ENCOUNTER — Inpatient Hospital Stay: Payer: Medicare Other | Attending: Hematology

## 2022-07-05 DIAGNOSIS — C9000 Multiple myeloma not having achieved remission: Secondary | ICD-10-CM

## 2022-07-05 DIAGNOSIS — Z5112 Encounter for antineoplastic immunotherapy: Secondary | ICD-10-CM | POA: Diagnosis not present

## 2022-07-05 DIAGNOSIS — D630 Anemia in neoplastic disease: Secondary | ICD-10-CM | POA: Diagnosis not present

## 2022-07-05 DIAGNOSIS — Z79620 Long term (current) use of immunosuppressive biologic: Secondary | ICD-10-CM | POA: Insufficient documentation

## 2022-07-05 DIAGNOSIS — D6481 Anemia due to antineoplastic chemotherapy: Secondary | ICD-10-CM | POA: Insufficient documentation

## 2022-07-05 MED ORDER — HEPARIN SOD (PORK) LOCK FLUSH 100 UNIT/ML IV SOLN
500.0000 [IU] | Freq: Every day | INTRAVENOUS | Status: AC | PRN
  Administered 2022-07-05: 500 [IU]

## 2022-07-05 MED ORDER — DIPHENHYDRAMINE HCL 25 MG PO CAPS
25.0000 mg | ORAL_CAPSULE | Freq: Once | ORAL | Status: AC
  Administered 2022-07-05: 25 mg via ORAL
  Filled 2022-07-05: qty 1

## 2022-07-05 MED ORDER — ACETAMINOPHEN 325 MG PO TABS
650.0000 mg | ORAL_TABLET | Freq: Once | ORAL | Status: AC
  Administered 2022-07-05: 650 mg via ORAL
  Filled 2022-07-05: qty 2

## 2022-07-05 MED ORDER — SODIUM CHLORIDE 0.9% IV SOLUTION
250.0000 mL | Freq: Once | INTRAVENOUS | Status: AC
  Administered 2022-07-05: 250 mL via INTRAVENOUS

## 2022-07-05 MED ORDER — SODIUM CHLORIDE 0.9% FLUSH
10.0000 mL | INTRAVENOUS | Status: AC | PRN
  Administered 2022-07-05: 10 mL

## 2022-07-05 NOTE — Progress Notes (Signed)
UNMATCHED BLOOD PRODUCT NOTE  Compare the patient ID on the blood tag to the patient ID on the hospital armband and Blood Bank armband. Then confirm the unit number on the blood tag matches the unit number on the blood product.  If a discrepancy is discovered return the product to blood bank immediately.   Blood Product Type: Packed Red Blood Cells  Unit #: (Found on blood product bag, begins with [id number]  Product Code #: (Found on blood product bag, begins with E) [id number]   Start Time: 1010 am. Verified by Domenica Reamer RN and Benjiman Core RN.   Starting Rate: 120 ml/hr  Rate increase/decreased  (if applicable):   123456   ml/hr  Rate changed time (if applicable): 99991111 am.    Stop Time: 11:43 am.    All Other Documentation should be documented within the Blood Admin Flowsheet per policy.  Blood products given today per MD orders. Tolerated infusion without adverse affects. Vital signs stable. No complaints at this time. Discharged from clinic ambulatory in stable condition. Alert and oriented x 3. F/U with [facility] as scheduled.

## 2022-07-05 NOTE — Patient Instructions (Signed)
[facility]  Discharge Instructions: Thank you for choosing [facility] to provide your oncology and hematology care.  If you have a lab appointment with the [facility], please come in thru the Main Entrance and check in at the main information desk.  Wear comfortable clothing and clothing appropriate for easy access to any Portacath or PICC line.   We strive to give you quality time with your provider. You may need to reschedule your appointment if you arrive late (15 or more minutes).  Arriving late affects you and other patients whose appointments are after yours.  Also, if you miss three or more appointments without notifying the office, you may be dismissed from the clinic at the provider's discretion.      For prescription refill requests, have your pharmacy contact our office and allow 72 hours for refills to be completed.    Today you received the following chemotherapy and/or immunotherapy agents 1 unit of blood .  Blood Transfusion, Adult, Care After The following information offers guidance on how to care for yourself after your procedure. Your health care provider may also give you more specific instructions. If you have problems or questions, contact your health care provider. What can I expect after the procedure? After the procedure, it is common to have: Bruising and soreness where the IV was inserted. A headache. Follow these instructions at home: IV insertion site care     Follow instructions from your health care provider about how to take care of your IV insertion site. Make sure you: Wash your hands with soap and water for at least 20 seconds before and after you change your bandage (dressing). If soap and water are not available, use hand sanitizer. Change your dressing as told by your health care provider. Check your IV insertion site every day for signs of infection. Check for: Redness, swelling, or pain. Bleeding from the  site. Warmth. Pus or a bad smell. General instructions Take over-the-counter and prescription medicines only as told by your health care provider. Rest as told by your health care provider. Return to your normal activities as told by your health care provider. Keep all follow-up visits. Lab tests may need to be done at certain periods to recheck your blood counts. Contact a health care provider if: You have itching or red, swollen areas of skin (hives). You have a fever or chills. You have pain in the head, back, or chest. You feel anxious or you feel weak after doing your normal activities. You have redness, swelling, warmth, or pain around the IV insertion site. You have blood coming from the IV insertion site that does not stop with pressure. You have pus or a bad smell coming from your IV insertion site. If you received your blood transfusion in an outpatient setting, you will be told whom to contact to report any reactions. Get help right away if: You have symptoms of a serious allergic or immune system reaction, including: Trouble breathing or shortness of breath. Swelling of the face, feeling flushed, or widespread rash. Dark urine or blood in the urine. Fast heartbeat. These symptoms may be an emergency. Get help right away. Call 911. Do not wait to see if the symptoms will go away. Do not drive yourself to the hospital. Summary Bruising and soreness around the IV insertion site are common. Check your IV insertion site every day for signs of infection. Rest as told by your health care provider. Return to your  normal activities as told by your health care provider. Get help right away for symptoms of a serious allergic or immune system reaction to the blood transfusion. This information is not intended to replace advice given to you by your health care provider. Make sure you discuss any questions you have with your health care provider. Document Revised: 07/20/2021 Document  Reviewed: 07/20/2021 Elsevier Patient Education  [place].       To help prevent nausea and vomiting after your treatment, we encourage you to take your nausea medication as directed.  BELOW ARE SYMPTOMS THAT SHOULD BE REPORTED IMMEDIATELY: *FEVER GREATER THAN 100.4 F (38 C) OR HIGHER *CHILLS OR SWEATING *NAUSEA AND VOMITING THAT IS NOT CONTROLLED WITH YOUR NAUSEA MEDICATION *UNUSUAL SHORTNESS OF BREATH *UNUSUAL BRUISING OR BLEEDING *URINARY PROBLEMS (pain or burning when urinating, or frequent urination) *BOWEL PROBLEMS (unusual diarrhea, constipation, pain near the anus) TENDERNESS IN MOUTH AND THROAT WITH OR WITHOUT PRESENCE OF ULCERS (sore throat, sores in mouth, or a toothache) UNUSUAL RASH, SWELLING OR PAIN  UNUSUAL VAGINAL DISCHARGE OR ITCHING   Items with * indicate a potential emergency and should be followed up as soon as possible or go to the Emergency Department if any problems should occur.  Please show the CHEMOTHERAPY ALERT CARD or IMMUNOTHERAPY ALERT CARD at check-in to the Emergency Department and triage nurse.  Should you have questions after your visit or need to cancel or reschedule your appointment, please contact [facility] [phone]  and follow the prompts.  Office hours are 8:00 a.m. to 4:30 p.m. Monday - Friday. Please note that voicemails left after 4:00 p.m. may not be returned until the following business day.  We are closed weekends and major holidays. You have access to a nurse at all times for urgent questions. Please call the main number to the clinic [phone] and follow the prompts.  For any non-urgent questions, you may also contact your provider using MyChart. We now offer e-Visits for anyone 13 and older to request care online for non-urgent symptoms. For details visit [email or web address].   Also download the MyChart app! Go to the app store, search "MyChart", open the app, select [facility], and log in with  your MyChart username and password.

## 2022-07-05 NOTE — Progress Notes (Signed)
Chaplain engaged in a follow-up visit with Dale [name]. Chaplain spent some time with him as he talked about his healthcare journey, turning 53, and engaged in some narrative life review. Dale [name] vocalized that being 53 was much better because of the significant health challenges he has seen at 62. He detailed his treatment schedule and what is has been like getting blood transfusions. He compared it to a card getting fuel.   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  He worked at his job for about 37 years. 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   Dale [name] also spent time sharing about his wife, who is now unable to walk, as well as his home life, driving, and food.   Chaplain spent time offering reflection, community, and support.   Bea Graff, MDiv  07/05/22 1100  Spiritual Encounters  Type of Visit Initial  Care provided to: Patient  Spiritual Framework  Presenting Themes Meaning/purpose/sources of inspiration;Impactful experiences and emotions;Significant life change  Interventions  Spiritual Care Interventions Made Compassionate presence;Reflective listening;Narrative/life review  Intervention Outcomes  Outcomes Awareness of support;Connected to spiritual community;Connection to spiritual care

## 2022-07-07 LAB — TYPE AND SCREEN
ABO/RH(D): O POS
Antibody Screen: POSITIVE
DAT, IgG: NEGATIVE
Unit division: 0
Unit division: 0

## 2022-07-07 LAB — BPAM RBC
Blood Product Expiration Date: 202403272359
Blood Product Expiration Date: 202404052359
ISSUE DATE / TIME: 202402281837
ISSUE DATE / TIME: 202403011000
Unit Type and Rh: 5100
Unit Type and Rh: 5100

## 2022-07-08 ENCOUNTER — Other Ambulatory Visit: Payer: Self-pay

## 2022-07-08 DIAGNOSIS — C9000 Multiple myeloma not having achieved remission: Secondary | ICD-10-CM

## 2022-07-08 LAB — PROTEIN ELECTROPHORESIS, SERUM
A/G Ratio: 0.7 (ref 0.7–1.7)
Albumin ELP: 3.1 g/dL (ref 2.9–4.4)
Alpha-1-Globulin: 0.2 g/dL (ref 0.0–0.4)
Alpha-2-Globulin: 0.7 g/dL (ref 0.4–1.0)
Beta Globulin: 0.6 g/dL — ABNORMAL LOW (ref 0.7–1.3)
Gamma Globulin: 3 g/dL — ABNORMAL HIGH (ref 0.4–1.8)
Globulin, Total: 4.5 g/dL — ABNORMAL HIGH (ref 2.2–3.9)
M-Spike, %: 2.9 g/dL — ABNORMAL HIGH
Total Protein ELP: 7.6 g/dL (ref 6.0–8.5)

## 2022-07-09 NOTE — Progress Notes (Signed)
[facility] [address] [place] [address]    Clinic Day:  07/10/2022  Referring physician: No ref. provider found  Patient Care Team: Pcp, No as PCP - General Troy Sine, MD as PCP - Cardiology (Cardiology) Troy Sine, MD as Consulting Physician (Cardiology) Gala Romney Cristopher Estimable, MD as Consulting Physician (Gastroenterology) Derek Jack, MD as Medical Oncologist (Oncology)   ASSESSMENT & PLAN:   Assessment: 1.  IgG lambda plasma cell myeloma: - Work-up for macrocytic anemia on 12/01/2020 showed M spike of 2.9 g.  Immunofixation IgG lambda. - Lambda light chains elevated at 284.  Light chain ratio was 0.04.  LDH was 163.  Creatinine was 0.9 and calcium 8.9. - Bone marrow biopsy on 12/18/2020-hypercellular marrow for age with 48% atypical plasma cells with lambda light chain restriction. - Chromosome analysis was normal. 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 - Revlimid 5 mg, 2 weeks on/1 week off started on 01/20/2021.  Dexamethasone weekly 10 mg added on 02/07/2021.  Revlimid is discontinued around 06/15/2021 due to poor tolerance and ineffectiveness at low-dose.  Thrombocytopenia and anemia. - Velcade weekly on days 1, 8, 15 every 21 days along with dexamethasone 10 mg weekly started on 06/25/2021. 2.  Macrocytic anemia: - CBC on 12/01/2020 with hemoglobin 8.5 and MCV of 117. - Denies any bleeding per rectum or melena.  3.  Social/family history: - Lives at home with his wife.  He does all ADLs and IADLs.  He even does yard work. - He worked at [employer] for 35 years.  Denies any chemical exposure.  Non-smoker. - No family history of malignancies.  Plan:  1.  IgG lambda plasma cell myeloma, high risk: - We reviewed myeloma panel from 07/03/2022.  M spike is stable at 2.9.  Lambda light chains improved slightly to 488.  Ratio is 0.01. - He complained of dyspnea  on exertion after he sprayed 2,4D in his garden. - Labs today shows hemoglobin 9.1.  Platelet count 60 K. - He will proceed with cycle 5 of Darzalex and Velcade with dexamethasone 3 weeks on/1 week off. - RTC 4 weeks for follow-up.  Will repeat myeloma labs the week prior.  2.  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  He received blood transfusion last week.  3.  Hypophosphatemia: - Phosphate is normal at 2.6.  Continue K-Phos 200 mg daily. - Continue vitamin D 400 units daily which was low previously.  4.  Right ankle swelling: - Continue Lasix as needed.  5.  ID prophylaxis: - Continue acyclovir twice daily and aspirin 81 mg daily.  Orders Placed This Encounter  Procedures   Comprehensive metabolic panel    Standing Status:   Future    Standing Expiration Date:   10/02/2023   Magnesium    Standing Status:   Future    Standing Expiration Date:   10/02/2023   CBC with Differential    Standing Status:   Future    Standing Expiration Date:   10/02/2023      I,Alexis Herring,acting as a scribe for Derek Jack, MD.,have documented all relevant documentation on the behalf of Derek Jack, MD,as directed by  Derek Jack, MD while in the presence of Derek Jack, MD.  I, Derek Jack MD, have reviewed the above documentation for accuracy and completeness, and I agree with the above.  Derek Jack, MD   3/6/20246:02 PM  CHIEF COMPLAINT:   Diagnosis: multiple myeloma and anemia    Cancer Staging  No matching staging information was found for the patient.   Prior Therapy: None  Current Therapy:  Weekly Velcade and daratumumab     HISTORY OF PRESENT ILLNESS:   Oncology History  Multiple myeloma ([place])  01/15/2021 Initial Diagnosis   Multiple myeloma ([place])   06/25/2021 - 12/06/2021 Chemotherapy   Patient is on Treatment Plan : MYELOMA NON-TRANSPLANT CANDIDATES VRd weekly q21d     12/20/2021 -   Chemotherapy   Patient is on Treatment Plan : MYELOMA Daratumumab IV q28d        INTERVAL HISTORY:   Dale [name] is a 89 y.o. male presenting to clinic today for follow up of multiple myeloma and anemia. He was last seen by me on 06/19/22.  Today, he states that he is doing well overall. His appetite level is at 100%. His energy level is at 75%.  He reported dyspnea on exertion.  Denies any cough.   PAST MEDICAL HISTORY:   Past Medical History: Past Medical History:  Diagnosis Date   Allergic rhinitis    Anal fissure    Anginal pain (HCC)    Asthma    Cancer ([place])    skin   Chest pain    CHF (congestive heart failure) ([place])    Coronary heart disease    s/p stenting. cath in 01/2012 noncritical occlusion   Dyspnea    Dysrhythmia    1st degree heart block   GERD (gastroesophageal reflux disease)    Glaucoma    Hiatal hernia    Hyperlipidemia    Hypertension    Hypothyroidism    Idiopathic thrombocytopenic purpura ([facility]) 2002   Macular degeneration    Multiple myeloma ([place]) 01/15/2021   Nephrolithiasis    PUD (peptic ulcer disease)    remote   Sarcoidosis    pulmonary   Schatzki's ring     Surgical History: Past Surgical History:  Procedure Laterality Date   cardiac stents     COLONOSCOPY  10/30/2006   Normal rectum, sigmoid diverticula.Remainder of colonic mucosa appeared normal.   CORONARY ANGIOPLASTY WITH STENT PLACEMENT     about 10 years ago per pt (around 2007)   [place], URETEROSCOPY AND STENT PLACEMENT Left 06/16/2017   Procedure: CYSTOSCOPY WITH RETROGRADE PYELOGRAM, URETEROSCOPY,STONE EXTRACTION  AND STENT PLACEMENT;  Surgeon: Franchot Gallo, MD;  Location: [facility];  Service: Urology;  Laterality: Left;   ESOPHAGOGASTRODUODENOSCOPY  06/19/2004   Two esophageal rings and esophageal web as described above.  All of these were disrupted by passing 56-French Venia Minks dilator/ Candida esophagitis,which appears to be incidental given  history of   antibiotic use, but nevertheless will be treated.   ESOPHAGOGASTRODUODENOSCOPY  10/30/2006   Distal tandem esophageal ring status post dilation disruption as  described above.  Otherwise normal esophagus/  Small hiatal hernia otherwise normal stomach, D1 and D2   ESOPHAGOGASTRODUODENOSCOPY N/A 03/22/2015   Dr.Rourk- noncritical schatzki's ring and hiatal hernia-o/w normal EGD.    ESOPHAGOGASTRODUODENOSCOPY (EGD) WITH ESOPHAGEAL DILATION  03/04/2012   RMR- schatzki's ring, hiatal hernia, polypoid gastric mucosa, bx= minimally active gastritis.   HOLMIUM LASER APPLICATION Left [id number]   Procedure: HOLMIUM LASER APPLICATION;  Surgeon: Franchot Gallo, MD;  Location: [facility];  Service: Urology;  Laterality: Left;   IR CV LINE INJECTION  12/27/2021   IR GENERIC HISTORICAL  03/06/2016   IR RADIOLOGIST EVAL & MGMT  03/06/2016 Aletta Edouard, MD [facility] INTERV RAD   IR GENERIC HISTORICAL  06/18/2016   IR RADIOLOGIST EVAL & MGMT 06/18/2016 Aletta Edouard, MD [facility] INTERV RAD   IR IMAGING GUIDED PORT INSERTION  12/18/2021   IR IMAGING GUIDED PORT INSERTION  01/15/2022   IR RADIOLOGIST EVAL & MGMT  10/01/2016   IR RADIOLOGIST EVAL & MGMT  10/15/2017   IR RADIOLOGIST EVAL & MGMT  12/24/2018   IR RADIOLOGIST EVAL & MGMT  01/04/2020   IR RADIOLOGIST EVAL & MGMT  06/20/2021   IR REMOVAL TUN ACCESS W/ PORT W/O FL MOD SED  01/15/2022   LEFT HEART CATH N/A 02/02/2012   Procedure: LEFT HEART CATH;  Surgeon: Lorretta Harp, MD;  Location: [facility] CATH LAB;  Service: Cardiovascular;  Laterality: N/A;   MEDIASTINOSCOPY     for dx sarcoid   RADIOLOGY WITH ANESTHESIA Left 05/17/2016   Procedure: left renal ablation;  Surgeon: Aletta Edouard, MD;  Location: [facility];  Service: Radiology;  Laterality: Left;    Social History: Social History   Socioeconomic History   Marital status: Married    Spouse name: Not on file   Number of children: 1   Years of education: Not on file   Highest education level: Not on  file  Occupational History   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    Employer: RETIRED  Tobacco Use   Smoking status: Former    Packs/day: 0.10    Years: 2.00    Total pack years: 0.20    Types: Cigarettes, Cigars    Quit date: 05/06/1970    Years since quitting: 52.2   Smokeless tobacco: Never  Vaping Use   Vaping Use: Never used  Substance and Sexual Activity   Alcohol use: No    Alcohol/week: 0.0 standard drinks of alcohol   Drug use: No   Sexual activity: Never  Other Topics Concern   Not on file  Social History Narrative   Not on file   Social Determinants of Health   Financial Resource Strain: Not on file  Food Insecurity: No Food Insecurity (06/04/2022)   Hunger Vital Sign    Worried About Running Out of Food in the Last Year: Never true    Ran Out of Food in the Last Year: Never true  Transportation Needs: No Transportation Needs (06/04/2022)   PRAPARE - Hydrologist (Medical): No    Lack of Transportation (Non-Medical): No  Physical Activity: Not on file  Stress: Not on file  Social Connections: Not on file  Intimate Partner Violence: Not At Risk (06/04/2022)   Humiliation, Afraid, Rape, and Kick questionnaire    Fear of Current or Ex-Partner: No    Emotionally Abused: No    Physically Abused: No    Sexually Abused: No    Family History: Family History  Problem Relation Age of Onset   Heart disease Father        deceased age 57   Stroke Mother    Alzheimer's disease Mother    Heart attack Brother        deceased age 28   Cancer Other        niece   Colon cancer Neg Hx     Current Medications:  Current Outpatient Medications:    acetaminophen (TYLENOL) 325 MG tablet, Take 2 tablets (650 mg total) by mouth every 6 (six) hours as needed for mild pain (or Fever >/= 101)., Disp: ,  Rfl:    acetaZOLAMIDE (DIAMOX) 250 MG tablet, Take by mouth., Disp: , Rfl:    acyclovir (ZOVIRAX) 400 MG tablet, TAKE 1  TABLET BY MOUTH TWICE  DAILY, Disp: 180 tablet, Rfl: 0   Ascorbic Acid (VITAMIN C PO), Take 500 mg by mouth every evening., Disp: , Rfl:    aspirin EC 81 MG tablet, Take 1 tablet (81 mg total) by mouth daily with breakfast. For 30 days Only, Disp: 30 tablet, Rfl: 0   aspirin EC 81 MG tablet, Take by mouth., Disp: , Rfl:    atorvastatin (LIPITOR) 40 MG tablet, TAKE 1 TABLET BY MOUTH IN  THE EVENING (Patient taking differently: Take 40 mg by mouth daily.), Disp: 90 tablet, Rfl: 3   Bortezomib (VELCADE IJ), Inject as directed once a week., Disp: , Rfl:    brimonidine (ALPHAGAN) 0.2 % ophthalmic solution, Place 1 drop into the left eye 2 (two) times daily., Disp: , Rfl:    brimonidine-timolol (COMBIGAN) 0.2-0.5 % ophthalmic solution, Apply to eye., Disp: , Rfl:    clopidogrel (PLAVIX) 75 MG tablet, Take by mouth., Disp: , Rfl:    cyanocobalamin 1000 MCG tablet, Take 1 tablet (1,000 mcg total) by mouth daily., Disp: 30 tablet, Rfl: 3   dexamethasone (DECADRON) 2 MG tablet, TAKE 5 TABLETS BY MOUTH ONCE  WEEKLY, Disp: 60 tablet, Rfl: 0   dorzolamide (TRUSOPT) 2 % ophthalmic solution, Apply to eye., Disp: , Rfl:    dorzolamide-timolol (COSOPT) 22.3-6.8 MG/ML ophthalmic solution, Place 1 drop into the left eye 2 (two) times daily., Disp: , Rfl:    ezetimibe (ZETIA) 10 MG tablet, Take by mouth., Disp: , Rfl:    feeding supplement (ENSURE ENLIVE / ENSURE PLUS) LIQD, Take 237 mLs by mouth 3 (three) times daily between meals., Disp: , Rfl:    furosemide (LASIX) 20 MG tablet, Take 1 tablet (20 mg total) by mouth as needed for up to 30 doses for fluid. In the morning, Disp: 30 tablet, Rfl: 0   hydrochlorothiazide (HYDRODIURIL) 12.5 MG tablet, Take 1 tablet by mouth daily as needed., Disp: , Rfl:    isosorbide mononitrate (IMDUR) 60 MG 24 hr tablet, Take 1 tablet (60 mg total) by mouth daily. TAKE 1 AND 1/2 TABLETS BY  MOUTH IN THE MORNING AND  1/2 TABLET AT NIGHT (Patient taking differently: Take 60 mg by mouth  daily. TAKE 1 TABLETS BY  MOUTH IN THE MORNING AND  1/2 TABLET AT NIGHT), Disp: , Rfl:    levothyroxine (SYNTHROID) 50 MCG tablet, Take by mouth., Disp: , Rfl:    levothyroxine (SYNTHROID) 75 MCG tablet, Take 75 mcg by mouth daily before breakfast., Disp: , Rfl:    lidocaine-prilocaine (EMLA) cream, Apply 1 Application topically as needed., Disp: 30 g, Rfl: 1   losartan (COZAAR) 100 MG tablet, TAKE 1 TABLET BY MOUTH DAILY (Patient taking differently: Take 100 mg by mouth daily.), Disp: 90 tablet, Rfl: 3   losartan (COZAAR) 100 MG tablet, Take by mouth., Disp: , Rfl:    metoprolol tartrate (LOPRESSOR) 25 MG tablet, TAKE 1 TABLET BY MOUTH IN  THE MORNING AND ONE-HALF  TABLET BY MOUTH IN THE  EVENING (Patient taking differently: Take 12.5-25 mg by mouth in the morning and at bedtime. TAKE 1 TABLET BY MOUTH IN  THE MORNING AND ONE-HALF  TABLET BY MOUTH IN THE  EVENING), Disp: 135 tablet, Rfl: 3   Multiple Vitamins-Minerals (PRESERVISION/LUTEIN) CAPS, Take 1 capsule by mouth 2 (two) times daily., Disp: , Rfl:  nitroGLYCERIN (NITROSTAT) 0.4 MG SL tablet, Place 1 tablet (0.4 mg total) under the tongue every 5 (five) minutes as needed. For chest pain, Disp: 25 tablet, Rfl: 2   pantoprazole (PROTONIX) 40 MG tablet, TAKE 1 TABLET BY MOUTH DAILY  BEFORE BREAKFAST (Patient taking differently: Take 40 mg by mouth daily.), Disp: 90 tablet, Rfl: 3   potassium chloride SA (KLOR-CON M) 20 MEQ tablet, Take 20 mEq by mouth daily., Disp: , Rfl:    potassium phosphate, monobasic, (PHOSPHO-TRIN K500) 500 MG tablet, Take 1 tablet (500 mg total) by mouth 2 (two) times daily with a meal., Disp: 60 tablet, Rfl: 1   prochlorperazine (COMPAZINE) 10 MG tablet, Take 1 tablet (10 mg total) by mouth every 6 (six) hours as needed for nausea or vomiting., Disp: 30 tablet, Rfl: 3   ROCKLATAN 0.02-0.005 % SOLN, Place 1 drop into the left eye at bedtime., Disp: , Rfl:    triamcinolone (KENALOG) 0.1 % cream, Apply 1 application.  topically 2 (two) times daily as needed (for irritation)., Disp: , Rfl:    vitamin E 200 UNIT capsule, Take 200 Units by mouth every evening., Disp: , Rfl:  No current facility-administered medications for this visit.  Facility-Administered Medications Ordered in Other Visits:    acetaminophen (TYLENOL) 325 MG tablet, , , ,    diphenhydrAMINE (BENADRYL) 25 mg capsule, , , ,    montelukast (SINGULAIR) 10 MG tablet, , , ,    Allergies: Allergies  Allergen Reactions   Azithromycin Other (See Comments)    Sore mouth and fever blisters around mouth, sores in nose area as well   Doxazosin Shortness Of Breath   Atenolol Other (See Comments)    UNKNOWN REACTION   Hydrocodone Nausea And Vomiting   Levofloxacin Other (See Comments)    Caused stomach problems.   Morphine Other (See Comments)    "made me crazy"   Penicillins Nausea And Vomiting and Other (See Comments)    Has patient had a PCN reaction causing immediate rash, facial/tongue/throat swelling, SOB or lightheadedness with hypotension: No Has patient had a PCN reaction causing severe rash involving mucus membranes or skin necrosis: No Has patient had a PCN reaction that required hospitalization No Has patient had a PCN reaction occurring within the last 10 years: No If all of the above answers are "NO", then may proceed with Cephalosporin use.    Sulfonamide Derivatives Nausea And Vomiting    REVIEW OF SYSTEMS:   Review of Systems  Constitutional:  Negative for chills, fatigue and fever.  HENT:   Negative for lump/mass, mouth sores, nosebleeds, sore throat and trouble swallowing.   Eyes:  Negative for eye problems.  Respiratory:  Positive for shortness of breath. Negative for cough.   Cardiovascular:  Negative for chest pain, leg swelling and palpitations.  Gastrointestinal:  Positive for constipation. Negative for abdominal pain, diarrhea, nausea and vomiting.  Genitourinary:  Negative for bladder incontinence, difficulty  urinating, dysuria, frequency, hematuria and nocturia.   Musculoskeletal:  Negative for arthralgias, back pain, flank pain, myalgias and neck pain.  Skin:  Negative for itching and rash.  Neurological:  Negative for dizziness, headaches and numbness.  Hematological:  Does not bruise/bleed easily.  Psychiatric/Behavioral:  Negative for depression, sleep disturbance and suicidal ideas. The patient is not nervous/anxious.   All other systems reviewed and are negative.    VITALS:   Blood pressure (!) 168/68.  Wt Readings from Last 3 Encounters:  07/10/22 127 lb (57.6 kg)  06/26/22 129  lb 9.6 oz (58.8 kg)  06/19/22 125 lb (56.7 kg)    There is no height or weight on file to calculate BMI.  Performance status (ECOG): 1 - Symptomatic but completely ambulatory  PHYSICAL EXAM:   Physical Exam Vitals and nursing note reviewed. Exam conducted with a chaperone present.  Constitutional:      Appearance: Normal appearance.  Cardiovascular:     Rate and Rhythm: Normal rate and regular rhythm.     Pulses: Normal pulses.     Heart sounds: Normal heart sounds.  Pulmonary:     Effort: Pulmonary effort is normal.     Breath sounds: Normal breath sounds.  Abdominal:     Palpations: Abdomen is soft. There is no hepatomegaly, splenomegaly or mass.     Tenderness: There is no abdominal tenderness.  Musculoskeletal:     Right lower leg: No edema.     Left lower leg: No edema.  Lymphadenopathy:     Cervical: No cervical adenopathy.     Right cervical: No superficial, deep or posterior cervical adenopathy.    Left cervical: No superficial, deep or posterior cervical adenopathy.     Upper Body:     Right upper body: No supraclavicular or axillary adenopathy.     Left upper body: No supraclavicular or axillary adenopathy.  Neurological:     General: No focal deficit present.     Mental Status: He is alert and oriented to person, place, and time.  Psychiatric:        Mood and Affect: Mood  normal.        Behavior: Behavior normal.     LABS:      Latest Ref Rng & Units 07/10/2022    8:03 AM 07/03/2022    8:33 AM 06/26/2022    7:50 AM  CBC  WBC 4.0 - 10.5 K/uL 5.6  3.8  3.3   Hemoglobin 13.0 - 17.0 g/dL 9.1  7.8  8.9   Hematocrit 39.0 - 52.0 % 27.8  24.1  27.1   Platelets 150 - 400 K/uL 60  77  76       Latest Ref Rng & Units 07/10/2022    8:03 AM 07/03/2022    8:33 AM 06/26/2022    7:50 AM  CMP  Glucose 70 - 99 mg/dL 87  98  105   BUN 8 - 23 mg/dL '25  17  17   '$ Creatinine 0.61 - 1.24 mg/dL 0.82  0.68  0.71   Sodium 135 - 145 mmol/L 131  129  129   Potassium 3.5 - 5.1 mmol/L 3.7  3.7  3.4   Chloride 98 - 111 mmol/L 105  103  100   CO2 22 - 32 mmol/L '23  24  23   '$ Calcium 8.9 - 10.3 mg/dL 8.3  8.0  8.0   Total Protein 6.5 - 8.1 g/dL 8.3  7.5  7.8   Total Bilirubin 0.3 - 1.2 mg/dL 0.5  0.5  0.6   Alkaline Phos 38 - 126 U/L 52  53  58   AST 15 - 41 U/L '16  18  24   '$ ALT 0 - 44 U/L 24  31  50      No results found for: "CEA1", "CEA" / No results found for: "CEA1", "CEA" No results found for: "PSA1" No results found for: "[id number]" No results found for: "CAN125"  Lab Results  Component Value Date   TOTALPROTELP 7.6 07/03/2022   ALBUMINELP 3.1 07/03/2022  A1GS 0.2 07/03/2022   A2GS 0.7 07/03/2022   BETS 0.6 (L) 07/03/2022   GAMS 3.0 (H) 07/03/2022   MSPIKE 2.9 (H) 07/03/2022   SPEI Comment 07/03/2022   Lab Results  Component Value Date   TIBC 257 08/23/2021   TIBC 298 02/07/2021   TIBC 263 05/27/2017   FERRITIN 1,415 (H) 01/24/2022   FERRITIN 939 (H) 08/23/2021   FERRITIN 123 02/07/2021   IRONPCTSAT 90 (H) 08/23/2021   IRONPCTSAT 17 (L) 02/07/2021   IRONPCTSAT 44 05/27/2017   Lab Results  Component Value Date   LDH 115 05/01/2021   LDH 120 04/09/2021   LDH 124 04/02/2021     STUDIES:   No results found.

## 2022-07-10 ENCOUNTER — Encounter: Payer: Self-pay | Admitting: Hematology

## 2022-07-10 ENCOUNTER — Inpatient Hospital Stay: Payer: Medicare Other

## 2022-07-10 ENCOUNTER — Inpatient Hospital Stay (HOSPITAL_BASED_OUTPATIENT_CLINIC_OR_DEPARTMENT_OTHER): Payer: Medicare Other | Admitting: Hematology

## 2022-07-10 VITALS — BP 179/71 | HR 70 | Temp 97.8°F | Resp 18

## 2022-07-10 VITALS — BP 168/68

## 2022-07-10 DIAGNOSIS — E46 Unspecified protein-calorie malnutrition: Secondary | ICD-10-CM | POA: Diagnosis not present

## 2022-07-10 DIAGNOSIS — D630 Anemia in neoplastic disease: Secondary | ICD-10-CM | POA: Diagnosis not present

## 2022-07-10 DIAGNOSIS — E871 Hypo-osmolality and hyponatremia: Secondary | ICD-10-CM | POA: Diagnosis not present

## 2022-07-10 DIAGNOSIS — D6481 Anemia due to antineoplastic chemotherapy: Secondary | ICD-10-CM | POA: Diagnosis not present

## 2022-07-10 DIAGNOSIS — C9000 Multiple myeloma not having achieved remission: Secondary | ICD-10-CM

## 2022-07-10 DIAGNOSIS — Z5112 Encounter for antineoplastic immunotherapy: Secondary | ICD-10-CM | POA: Diagnosis not present

## 2022-07-10 DIAGNOSIS — Z79620 Long term (current) use of immunosuppressive biologic: Secondary | ICD-10-CM | POA: Diagnosis not present

## 2022-07-10 LAB — COMPREHENSIVE METABOLIC PANEL
ALT: 24 U/L (ref 0–44)
AST: 16 U/L (ref 15–41)
Albumin: 2.6 g/dL — ABNORMAL LOW (ref 3.5–5.0)
Alkaline Phosphatase: 52 U/L (ref 38–126)
Anion gap: 3 — ABNORMAL LOW (ref 5–15)
BUN: 25 mg/dL — ABNORMAL HIGH (ref 8–23)
CO2: 23 mmol/L (ref 22–32)
Calcium: 8.3 mg/dL — ABNORMAL LOW (ref 8.9–10.3)
Chloride: 105 mmol/L (ref 98–111)
Creatinine, Ser: 0.82 mg/dL (ref 0.61–1.24)
GFR, Estimated: >60 mL/min (ref >60)
Glucose, Bld: 87 mg/dL (ref 70–99)
Potassium: 3.7 mmol/L (ref 3.5–5.1)
Sodium: 131 mmol/L — ABNORMAL LOW (ref 135–145)
Total Bilirubin: 0.5 mg/dL (ref 0.3–1.2)
Total Protein: 8.3 g/dL — ABNORMAL HIGH (ref 6.5–8.1)

## 2022-07-10 LAB — CBC WITH DIFFERENTIAL/PLATELET
Abs Immature Granulocytes: 0.04 10*3/uL (ref 0.00–0.07)
Basophils Absolute: 0 10*3/uL (ref 0.0–0.1)
Basophils Relative: 0 %
Eosinophils Absolute: 0.1 10*3/uL (ref 0.0–0.5)
Eosinophils Relative: 2 %
HCT: 27.8 % — ABNORMAL LOW (ref 39.0–52.0)
Hemoglobin: 9.1 g/dL — ABNORMAL LOW (ref 13.0–17.0)
Immature Granulocytes: 1 %
Lymphocytes Relative: 19 %
Lymphs Abs: 1.1 10*3/uL (ref 0.7–4.0)
MCH: 34 pg (ref 26.0–34.0)
MCHC: 32.7 g/dL (ref 30.0–36.0)
MCV: 103.7 fL — ABNORMAL HIGH (ref 80.0–100.0)
Monocytes Absolute: 0.7 10*3/uL (ref 0.1–1.0)
Monocytes Relative: 13 %
Neutro Abs: 3.6 10*3/uL (ref 1.7–7.7)
Neutrophils Relative %: 65 %
Platelets: 60 10*3/uL — ABNORMAL LOW (ref 150–400)
RBC: 2.68 MIL/uL — ABNORMAL LOW (ref 4.22–5.81)
RDW: 24.9 % — ABNORMAL HIGH (ref 11.5–15.5)
WBC: 5.6 10*3/uL (ref 4.0–10.5)
nRBC: 0 % (ref 0.0–0.2)

## 2022-07-10 LAB — PHOSPHORUS: Phosphorus: 2.6 mg/dL (ref 2.5–4.6)

## 2022-07-10 LAB — SAMPLE TO BLOOD BANK

## 2022-07-10 LAB — MAGNESIUM: Magnesium: 2 mg/dL (ref 1.7–2.4)

## 2022-07-10 MED ORDER — METHYLPREDNISOLONE SODIUM SUCC 125 MG IJ SOLR
100.0000 mg | Freq: Once | INTRAMUSCULAR | Status: AC
  Administered 2022-07-10: 100 mg via INTRAVENOUS
  Filled 2022-07-10: qty 2

## 2022-07-10 MED ORDER — BORTEZOMIB CHEMO SQ INJECTION 3.5 MG (2.5MG/ML)
1.0000 mg/m2 | Freq: Once | INTRAMUSCULAR | Status: AC
  Administered 2022-07-10: 1.75 mg via SUBCUTANEOUS
  Filled 2022-07-10: qty 0.7

## 2022-07-10 MED ORDER — SODIUM CHLORIDE 0.9% FLUSH
10.0000 mL | Freq: Once | INTRAVENOUS | Status: AC
  Administered 2022-07-10: 10 mL via INTRAVENOUS

## 2022-07-10 MED ORDER — ONDANSETRON HCL 4 MG/2ML IJ SOLN
8.0000 mg | Freq: Once | INTRAMUSCULAR | Status: AC
  Administered 2022-07-10: 8 mg via INTRAVENOUS
  Filled 2022-07-10: qty 4

## 2022-07-10 MED ORDER — SODIUM CHLORIDE 0.9 % IV SOLN
16.0000 mg/kg | Freq: Once | INTRAVENOUS | Status: AC
  Administered 2022-07-10: 900 mg via INTRAVENOUS
  Filled 2022-07-10: qty 40

## 2022-07-10 MED ORDER — SODIUM CHLORIDE 0.9 % IV SOLN: Freq: Once | INTRAVENOUS | Status: AC

## 2022-07-10 MED ORDER — CETIRIZINE HCL 10 MG/ML IV SOLN
10.0000 mg | Freq: Once | INTRAVENOUS | Status: AC
  Administered 2022-07-10: 10 mg via INTRAVENOUS
  Filled 2022-07-10: qty 1

## 2022-07-10 MED ORDER — ACETAMINOPHEN 325 MG PO TABS
650.0000 mg | ORAL_TABLET | Freq: Once | ORAL | Status: AC
  Administered 2022-07-10: 650 mg via ORAL
  Filled 2022-07-10: qty 2

## 2022-07-10 MED ORDER — HEPARIN SOD (PORK) LOCK FLUSH 100 UNIT/ML IV SOLN
500.0000 [IU] | Freq: Once | INTRAVENOUS | Status: AC | PRN
  Administered 2022-07-10: 500 [IU]

## 2022-07-10 MED ORDER — SODIUM CHLORIDE 0.9% FLUSH
10.0000 mL | INTRAVENOUS | Status: DC | PRN
  Administered 2022-07-10: 10 mL

## 2022-07-10 NOTE — Progress Notes (Signed)
Pt presents today for Daratumumab IV and Velcade per provider's order. Vital signs and other labs WNL for treatment today. Pt's platelets are 60 today, Dr.K aware. Okay to proceed with treatment today per Dr.K.  Treatment given today per MD orders. Tolerated infusion without adverse affects. Vital signs stable. No complaints at this time. Discharged from clinic ambulatory in stable condition. Alert and oriented x 3. F/U with [facility] as scheduled.

## 2022-07-10 NOTE — Progress Notes (Signed)
Patient has been examined by Dr. Delton Coombes. Vital signs and labs have been reviewed by MD - ANC, Creatinine, LFTs, hemoglobin, and platelets (60,000) are within treatment parameters per M.D. - pt may proceed with treatment.  Primary RN and pharmacy notified.

## 2022-07-10 NOTE — Patient Instructions (Signed)
[place] at [facility] Discharge Instructions   You were seen and examined today by Dr. Delton Coombes.  He reviewed the results of your lab work which are normal/stable.   We will proceed with your treatment today.   We will continue to check your lab work once a week and give blood transfusions as needed.   Continue phosphorus tablets once a day as prescribed.   Return as scheduled.    Thank you for choosing [place] at [facility] to provide your oncology and hematology care.  To afford each patient quality time with our provider, please arrive at least 15 minutes before your scheduled appointment time.   If you have a lab appointment with the [place] please come in thru the Main Entrance and check in at the main information desk.  You need to re-schedule your appointment should you arrive 10 or more minutes late.  We strive to give you quality time with our providers, and arriving late affects you and other patients whose appointments are after yours.  Also, if you no show three or more times for appointments you may be dismissed from the clinic at the providers discretion.     Again, thank you for choosing [facility].  Our hope is that these requests will decrease the amount of time that you wait before being seen by our physicians.       _____________________________________________________________  Should you have questions after your visit to [facility], please contact our office at [phone] and follow the prompts.  Our office hours are 8:00 a.m. and 4:30 p.m. Monday - Friday.  Please note that voicemails left after 4:00 p.m. may not be returned until the following business day.  We are closed weekends and major holidays.  You do have access to a nurse 24-7, just call the main number to the clinic [phone] and do not press any options, hold on the line and a nurse will answer the phone.     For prescription refill requests, have your pharmacy contact our office and allow 72 hours.    Due to Covid, you will need to wear a mask upon entering the hospital. If you do not have a mask, a mask will be given to you at the Main Entrance upon arrival. For doctor visits, patients may have 1 support person age 37 or older with them. For treatment visits, patients can not have anyone with them due to social distancing guidelines and our immunocompromised population.

## 2022-07-10 NOTE — Patient Instructions (Signed)
[name]  Discharge Instructions: Thank you for choosing [facility] to provide your oncology and hematology care.  If you have a lab appointment with the [facility], please come in thru the Main Entrance and check in at the main information desk.  Wear comfortable clothing and clothing appropriate for easy access to any Portacath or PICC line.   We strive to give you quality time with your provider. You may need to reschedule your appointment if you arrive late (15 or more minutes).  Arriving late affects you and other patients whose appointments are after yours.  Also, if you miss three or more appointments without notifying the office, you may be dismissed from the clinic at the provider's discretion.      For prescription refill requests, have your pharmacy contact our office and allow 72 hours for refills to be completed.    Today you received the following chemotherapy and/or immunotherapy agents Dara IV and Velcade   To help prevent nausea and vomiting after your treatment, we encourage you to take your nausea medication as directed.  Daratumumab; Hyaluronidase Injection What is this medication? DARATUMUMAB; HYALURONIDASE (dar a toom ue mab; hye al ur ON i dase) treats multiple myeloma, a type of bone marrow cancer. Daratumumab works by blocking a protein that causes cancer cells to grow and multiply. This helps to slow or stop the spread of cancer cells. Hyaluronidase works by increasing the absorption of other medications in the body to help them work better. This medication may also be used treat amyloidosis, a condition that causes the buildup of a protein (amyloid) in your body. It works by reducing the buildup of this protein, which decreases symptoms. It is a combination medication that contains a monoclonal antibody. This medicine may be used for other purposes; ask your health care provider or pharmacist if you have questions. COMMON BRAND  NAME(S): DARZALEX FASPRO What should I tell my care team before I take this medication? They need to know if you have any of these conditions: Heart disease Infection, such as chickenpox, cold sores, herpes, hepatitis B Lung or breathing disease An unusual or allergic reaction to daratumumab, hyaluronidase, other medications, foods, dyes, or preservatives Pregnant or trying to get pregnant Breast-feeding How should I use this medication? This medication is injected under the skin. It is given by your care team in a hospital or clinic setting. Talk to your care team about the use of this medication in children. Special care may be needed. Overdosage: If you think you have taken too much of this medicine contact a poison control center or emergency room at once. NOTE: This medicine is only for you. Do not share this medicine with others. What if I miss a dose? Keep appointments for follow-up doses. It is important not to miss your dose. Call your care team if you are unable to keep an appointment. What may interact with this medication? Interactions have not been studied. This list may not describe all possible interactions. Give your health care provider a list of all the medicines, herbs, non-prescription drugs, or dietary supplements you use. Also tell them if you smoke, drink alcohol, or use illegal drugs. Some items may interact with your medicine. What should I watch for while using this medication? Your condition will be monitored carefully while you are receiving this medication. This medication can cause serious allergic reactions. To reduce your risk, your care team may give you other medication to take before  receiving this one. Be sure to follow the directions from your care team. This medication can affect the results of blood tests to match your blood type. These changes can last for up to 6 months after the final dose. Your care team will do blood tests to match your blood type  before you start treatment. Tell all of your care team that you are being treated with this medication before receiving a blood transfusion. This medication can affect the results of some tests used to determine treatment response; extra tests may be needed to evaluate response. Talk to your care team if you wish to become pregnant or think you are pregnant. This medication can cause serious birth defects if taken during pregnancy and for 3 months after the last dose. A reliable form of contraception is recommended while taking this medication and for 3 months after the last dose. Talk to your care team about effective forms of contraception. Do not breast-feed while taking this medication. What side effects may I notice from receiving this medication? Side effects that you should report to your care team as soon as possible: Allergic reactions--skin rash, itching, hives, swelling of the face, lips, tongue, or throat Heart rhythm changes--fast or irregular heartbeat, dizziness, feeling faint or lightheaded, chest pain, trouble breathing Infection--fever, chills, cough, sore throat, wounds that don't heal, pain or trouble when passing urine, general feeling of discomfort or being unwell Infusion reactions--chest pain, shortness of breath or trouble breathing, feeling faint or lightheaded Sudden eye pain or change in vision such as blurry vision, seeing halos around lights, vision loss Unusual bruising or bleeding Side effects that usually do not require medical attention (report to your care team if they continue or are bothersome): Constipation Diarrhea Fatigue Nausea Pain, tingling, or numbness in the hands or feet Swelling of the ankles, hands, or feet This list may not describe all possible side effects. Call your doctor for medical advice about side effects. You may report side effects to FDA at 1-800-FDA-1088. Where should I keep my medication? This medication is given in a hospital or  clinic. It will not be stored at home. NOTE: This sheet is a summary. It may not cover all possible information. If you have questions about this medicine, talk to your doctor, pharmacist, or health care provider.  2023 Elsevier/Gold Standard (2021-08-15 00:00:00)  Bortezomib Injection What is this medication? BORTEZOMIB (bor TEZ oh mib) treats lymphoma. It may also be used to treat multiple myeloma, a type of bone marrow cancer. It works by blocking a protein that causes cancer cells to grow and multiply. This helps to slow or stop the spread of cancer cells. This medicine may be used for other purposes; ask your health care provider or pharmacist if you have questions. COMMON BRAND NAME(S): Velcade What should I tell my care team before I take this medication? They need to know if you have any of these conditions: Dehydration Diabetes Heart disease Liver disease Tingling of the fingers or toes or other nerve disorder An unusual or allergic reaction to bortezomib, other medications, foods, dyes, or preservatives If you or your partner are pregnant or trying to get pregnant Breastfeeding How should I use this medication? This medication is injected into a vein or under the skin. It is given by your care team in a hospital or clinic setting. Talk to your care team about the use of this medication in children. Special care may be needed. Overdosage: If you think you have taken too  much of this medicine contact a poison control center or emergency room at once. NOTE: This medicine is only for you. Do not share this medicine with others. What if I miss a dose? Keep appointments for follow-up doses. It is important not to miss your dose. Call your care team if you are unable to keep an appointment. What may interact with this medication? Ketoconazole Rifampin This list may not describe all possible interactions. Give your health care provider a list of all the medicines, herbs,  non-prescription drugs, or dietary supplements you use. Also tell them if you smoke, drink alcohol, or use illegal drugs. Some items may interact with your medicine. What should I watch for while using this medication? Your condition will be monitored carefully while you are receiving this medication. You may need blood work while taking this medication. This medication may affect your coordination, reaction time, or judgment. Do not drive or operate machinery until you know how this medication affects you. Sit up or stand slowly to reduce the risk of dizzy or fainting spells. Drinking alcohol with this medication can increase the risk of these side effects. This medication may increase your risk of getting an infection. Call your care team for advice if you get a fever, chills, sore throat, or other symptoms of a cold or flu. Do not treat yourself. Try to avoid being around people who are sick. Check with your care team if you have severe diarrhea, nausea, and vomiting, or if you sweat a lot. The loss of too much body fluid may make it dangerous for you to take this medication. Talk to your care team if you may be pregnant. Serious birth defects can occur if you take this medication during pregnancy and for 7 months after the last dose. You will need a negative pregnancy test before starting this medication. Contraception is recommended while taking this medication and for 7 months after the last dose. Your care team can help you find the option that works for you. If your partner can get pregnant, use a condom during sex while taking this medication and for 4 months after the last dose. Do not breastfeed while taking this medication and for 2 months after the last dose. This medication may cause infertility. Talk to your care team if you are concerned about your fertility. What side effects may I notice from receiving this medication? Side effects that you should report to your care team as soon as  possible: Allergic reactions--skin rash, itching, hives, swelling of the face, lips, tongue, or throat Bleeding--bloody or black, tar-like stools, vomiting blood or brown material that looks like coffee grounds, red or dark brown urine, small red or purple spots on skin, unusual bruising or bleeding Bleeding in the brain--severe headache, stiff neck, confusion, dizziness, change in vision, numbness or weakness of the face, arm, or leg, trouble speaking, trouble walking, vomiting Bowel blockage--stomach cramping, unable to have a bowel movement or pass gas, loss of appetite, vomiting Heart failure--shortness of breath, swelling of the ankles, feet, or hands, sudden weight gain, unusual weakness or fatigue Infection--fever, chills, cough, sore throat, wounds that don't heal, pain or trouble when passing urine, general feeling of discomfort or being unwell Liver injury--right upper belly pain, loss of appetite, nausea, light-colored stool, dark yellow or brown urine, yellowing skin or eyes, unusual weakness or fatigue Low blood pressure--dizziness, feeling faint or lightheaded, blurry vision Lung injury--shortness of breath or trouble breathing, cough, spitting up blood, chest pain, fever Pain, tingling,  or numbness in the hands or feet Severe or prolonged diarrhea Stomach pain, bloody diarrhea, pale skin, unusual weakness or fatigue, decrease in the amount of urine, which may be signs of hemolytic uremic syndrome Sudden and severe headache, confusion, change in vision, seizures, which may be signs of posterior reversible encephalopathy syndrome (PRES) TTP--purple spots on the skin or inside the mouth, pale skin, yellowing skin or eyes, unusual weakness or fatigue, fever, fast or irregular heartbeat, confusion, change in vision, trouble speaking, trouble walking Tumor lysis syndrome (TLS)--nausea, vomiting, diarrhea, decrease in the amount of urine, dark urine, unusual weakness or fatigue, confusion,  muscle pain or cramps, fast or irregular heartbeat, joint pain Side effects that usually do not require medical attention (report to your care team if they continue or are bothersome): Constipation Diarrhea Fatigue Loss of appetite Nausea This list may not describe all possible side effects. Call your doctor for medical advice about side effects. You may report side effects to FDA at 1-800-FDA-1088. Where should I keep my medication? This medication is given in a hospital or clinic. It will not be stored at home. NOTE: This sheet is a summary. It may not cover all possible information. If you have questions about this medicine, talk to your doctor, pharmacist, or health care provider.  2023 Elsevier/Gold Standard (2021-09-19 00:00:00)    BELOW ARE SYMPTOMS THAT SHOULD BE REPORTED IMMEDIATELY: *FEVER GREATER THAN 100.4 F (38 C) OR HIGHER *CHILLS OR SWEATING *NAUSEA AND VOMITING THAT IS NOT CONTROLLED WITH YOUR NAUSEA MEDICATION *UNUSUAL SHORTNESS OF BREATH *UNUSUAL BRUISING OR BLEEDING *URINARY PROBLEMS (pain or burning when urinating, or frequent urination) *BOWEL PROBLEMS (unusual diarrhea, constipation, pain near the anus) TENDERNESS IN MOUTH AND THROAT WITH OR WITHOUT PRESENCE OF ULCERS (sore throat, sores in mouth, or a toothache) UNUSUAL RASH, SWELLING OR PAIN  UNUSUAL VAGINAL DISCHARGE OR ITCHING   Items with * indicate a potential emergency and should be followed up as soon as possible or go to the Emergency Department if any problems should occur.  Please show the CHEMOTHERAPY ALERT CARD or IMMUNOTHERAPY ALERT CARD at check-in to the Emergency Department and triage nurse.  Should you have questions after your visit or need to cancel or reschedule your appointment, please contact [facility] [phone]  and follow the prompts.  Office hours are 8:00 a.m. to 4:30 p.m. Monday - Friday. Please note that voicemails left after 4:00 p.m. may not be returned  until the following business day.  We are closed weekends and major holidays. You have access to a nurse at all times for urgent questions. Please call the main number to the clinic [phone] and follow the prompts.  For any non-urgent questions, you may also contact your provider using MyChart. We now offer e-Visits for anyone 56 and older to request care online for non-urgent symptoms. For details visit [email or web address].   Also download the MyChart app! Go to the app store, search "MyChart", open the app, select [facility], and log in with your MyChart username and password.

## 2022-07-11 ENCOUNTER — Ambulatory Visit: Payer: Medicare Other | Admitting: Hematology

## 2022-07-11 ENCOUNTER — Ambulatory Visit: Payer: Medicare Other

## 2022-07-11 ENCOUNTER — Other Ambulatory Visit: Payer: Medicare Other

## 2022-07-12 ENCOUNTER — Inpatient Hospital Stay: Payer: Medicare Other

## 2022-07-15 ENCOUNTER — Other Ambulatory Visit: Payer: Self-pay

## 2022-07-17 ENCOUNTER — Inpatient Hospital Stay: Payer: Medicare Other

## 2022-07-17 VITALS — BP 153/69 | HR 74 | Temp 98.4°F | Resp 18

## 2022-07-17 DIAGNOSIS — Z79620 Long term (current) use of immunosuppressive biologic: Secondary | ICD-10-CM | POA: Diagnosis not present

## 2022-07-17 DIAGNOSIS — C9000 Multiple myeloma not having achieved remission: Secondary | ICD-10-CM | POA: Diagnosis not present

## 2022-07-17 DIAGNOSIS — D6481 Anemia due to antineoplastic chemotherapy: Secondary | ICD-10-CM | POA: Diagnosis not present

## 2022-07-17 DIAGNOSIS — Z5112 Encounter for antineoplastic immunotherapy: Secondary | ICD-10-CM | POA: Diagnosis not present

## 2022-07-17 DIAGNOSIS — D630 Anemia in neoplastic disease: Secondary | ICD-10-CM | POA: Diagnosis not present

## 2022-07-17 LAB — COMPREHENSIVE METABOLIC PANEL
ALT: 31 U/L (ref 0–44)
AST: 19 U/L (ref 15–41)
Albumin: 2.7 g/dL — ABNORMAL LOW (ref 3.5–5.0)
Alkaline Phosphatase: 51 U/L (ref 38–126)
Anion gap: 6 (ref 5–15)
BUN: 21 mg/dL (ref 8–23)
CO2: 25 mmol/L (ref 22–32)
Calcium: 8.7 mg/dL — ABNORMAL LOW (ref 8.9–10.3)
Chloride: 102 mmol/L (ref 98–111)
Creatinine, Ser: 0.78 mg/dL (ref 0.61–1.24)
GFR, Estimated: >60 mL/min (ref >60)
Glucose, Bld: 101 mg/dL — ABNORMAL HIGH (ref 70–99)
Potassium: 3.4 mmol/L — ABNORMAL LOW (ref 3.5–5.1)
Sodium: 133 mmol/L — ABNORMAL LOW (ref 135–145)
Total Bilirubin: 0.5 mg/dL (ref 0.3–1.2)
Total Protein: 8.8 g/dL — ABNORMAL HIGH (ref 6.5–8.1)

## 2022-07-17 LAB — CBC WITH DIFFERENTIAL/PLATELET
Abs Immature Granulocytes: 0.02 10*3/uL (ref 0.00–0.07)
Basophils Absolute: 0 10*3/uL (ref 0.0–0.1)
Basophils Relative: 0 %
Eosinophils Absolute: 0.2 10*3/uL (ref 0.0–0.5)
Eosinophils Relative: 4 %
HCT: 28.6 % — ABNORMAL LOW (ref 39.0–52.0)
Hemoglobin: 9.6 g/dL — ABNORMAL LOW (ref 13.0–17.0)
Immature Granulocytes: 1 %
Lymphocytes Relative: 30 %
Lymphs Abs: 1.2 10*3/uL (ref 0.7–4.0)
MCH: 34.7 pg — ABNORMAL HIGH (ref 26.0–34.0)
MCHC: 33.6 g/dL (ref 30.0–36.0)
MCV: 103.2 fL — ABNORMAL HIGH (ref 80.0–100.0)
Monocytes Absolute: 0.7 10*3/uL (ref 0.1–1.0)
Monocytes Relative: 18 %
Neutro Abs: 2 10*3/uL (ref 1.7–7.7)
Neutrophils Relative %: 47 %
Platelets: 78 10*3/uL — ABNORMAL LOW (ref 150–400)
RBC: 2.77 MIL/uL — ABNORMAL LOW (ref 4.22–5.81)
RDW: 24 % — ABNORMAL HIGH (ref 11.5–15.5)
WBC: 4.1 10*3/uL (ref 4.0–10.5)
nRBC: 0 % (ref 0.0–0.2)

## 2022-07-17 LAB — MAGNESIUM: Magnesium: 2.1 mg/dL (ref 1.7–2.4)

## 2022-07-17 LAB — SAMPLE TO BLOOD BANK

## 2022-07-17 LAB — PHOSPHORUS: Phosphorus: 3.1 mg/dL (ref 2.5–4.6)

## 2022-07-17 MED ORDER — SODIUM CHLORIDE 0.9 % IV SOLN: Freq: Once | INTRAVENOUS | Status: AC

## 2022-07-17 MED ORDER — SODIUM CHLORIDE 0.9% FLUSH
10.0000 mL | INTRAVENOUS | Status: DC | PRN
  Administered 2022-07-17: 10 mL

## 2022-07-17 MED ORDER — SODIUM CHLORIDE 0.9 % IV SOLN
16.0000 mg/kg | Freq: Once | INTRAVENOUS | Status: AC
  Administered 2022-07-17: 900 mg via INTRAVENOUS
  Filled 2022-07-17: qty 40

## 2022-07-17 MED ORDER — HEPARIN SOD (PORK) LOCK FLUSH 100 UNIT/ML IV SOLN
500.0000 [IU] | Freq: Once | INTRAVENOUS | Status: AC | PRN
  Administered 2022-07-17: 500 [IU]

## 2022-07-17 MED ORDER — ONDANSETRON HCL 4 MG/2ML IJ SOLN
8.0000 mg | Freq: Once | INTRAMUSCULAR | Status: AC
  Administered 2022-07-17: 8 mg via INTRAVENOUS
  Filled 2022-07-17: qty 4

## 2022-07-17 MED ORDER — CETIRIZINE HCL 10 MG/ML IV SOLN
10.0000 mg | Freq: Once | INTRAVENOUS | Status: AC
  Administered 2022-07-17: 10 mg via INTRAVENOUS
  Filled 2022-07-17: qty 1

## 2022-07-17 MED ORDER — METHYLPREDNISOLONE SODIUM SUCC 125 MG IJ SOLR
100.0000 mg | Freq: Once | INTRAMUSCULAR | Status: AC
  Administered 2022-07-17: 100 mg via INTRAVENOUS
  Filled 2022-07-17: qty 2

## 2022-07-17 MED ORDER — BORTEZOMIB CHEMO SQ INJECTION 3.5 MG (2.5MG/ML)
1.0000 mg/m2 | Freq: Once | INTRAMUSCULAR | Status: AC
  Administered 2022-07-17: 1.75 mg via SUBCUTANEOUS
  Filled 2022-07-17: qty 0.7

## 2022-07-17 MED ORDER — ACETAMINOPHEN 325 MG PO TABS
650.0000 mg | ORAL_TABLET | Freq: Once | ORAL | Status: AC
  Administered 2022-07-17: 650 mg via ORAL
  Filled 2022-07-17: qty 2

## 2022-07-17 NOTE — Patient Instructions (Signed)
[facility]  Discharge Instructions: Thank you for choosing [facility] to provide your oncology and hematology care.  If you have a lab appointment with the [facility], please come in thru the Main Entrance and check in at the main information desk.  Wear comfortable clothing and clothing appropriate for easy access to any Portacath or PICC line.   We strive to give you quality time with your provider. You may need to reschedule your appointment if you arrive late (15 or more minutes).  Arriving late affects you and other patients whose appointments are after yours.  Also, if you miss three or more appointments without notifying the office, you may be dismissed from the clinic at the provider's discretion.      For prescription refill requests, have your pharmacy contact our office and allow 72 hours for refills to be completed.    Today you received the following chemotherapy and/or immunotherapy agents Daratumumab/Velcade.  Daratumumab Injection What is this medication? DARATUMUMAB (dar a toom ue mab) treats multiple myeloma, a type of bone marrow cancer. It works by helping your immune system slow or stop the spread of cancer cells. It is a monoclonal antibody. This medicine may be used for other purposes; ask your health care provider or pharmacist if you have questions. COMMON BRAND NAME(S): DARZALEX What should I tell my care team before I take this medication? They need to know if you have any of these conditions: Hereditary fructose intolerance Infection, such as chickenpox, herpes, hepatitis B Lung or breathing disease, such as asthma, COPD An unusual or allergic reaction to daratumumab, sorbitol, other medications, foods, dyes, or preservatives Pregnant or trying to get pregnant Breastfeeding How should I use this medication? This medication is injected into a vein. It is given by your care team in a hospital or clinic setting. Talk to your  care team about the use of this medication in children. Special care may be needed. Overdosage: If you think you have taken too much of this medicine contact a poison control center or emergency room at once. NOTE: This medicine is only for you. Do not share this medicine with others. What if I miss a dose? Keep appointments for follow-up doses. It is important not to miss your dose. Call your care team if you are unable to keep an appointment. What may interact with this medication? Interactions have not been studied. This list may not describe all possible interactions. Give your health care provider a list of all the medicines, herbs, non-prescription drugs, or dietary supplements you use. Also tell them if you smoke, drink alcohol, or use illegal drugs. Some items may interact with your medicine. What should I watch for while using this medication? Your condition will be monitored carefully while you are receiving this medication. This medication can cause serious allergic reactions. To reduce your risk, your care team may give you other medication to take before receiving this one. Be sure to follow the directions from your care team. This medication can affect the results of blood tests to match your blood type. These changes can last for up to 6 months after the final dose. Your care team will do blood tests to match your blood type before you start treatment. Tell all of your care team that you are being treated with this medication before receiving a blood transfusion. This medication can affect the results of some tests used to determine treatment response; extra tests may be needed to  evaluate response. Talk to your care team if you wish to become pregnant or think you are pregnant. This medication can cause serious birth defects if taken during pregnancy and for 3 months after the last dose. A reliable form of contraception is recommended while taking this medication and for 3 months after  the last dose. Talk to your care team about effective forms of contraception. Do not breast-feed while taking this medication. What side effects may I notice from receiving this medication? Side effects that you should report to your care team as soon as possible: Allergic reactions--skin rash, itching, hives, swelling of the face, lips, tongue, or throat Infection--fever, chills, cough, sore throat, wounds that don't heal, pain or trouble when passing urine, general feeling of discomfort or being unwell Infusion reactions--chest pain, shortness of breath or trouble breathing, feeling faint or lightheaded Unusual bruising or bleeding Side effects that usually do not require medical attention (report to your care team if they continue or are bothersome): Constipation Diarrhea Fatigue Nausea Pain, tingling, or numbness in the hands or feet Swelling of the ankles, hands, or feet This list may not describe all possible side effects. Call your doctor for medical advice about side effects. You may report side effects to FDA at 1-800-FDA-1088. Where should I keep my medication? This medication is given in a hospital or clinic. It will not be stored at home. NOTE: This sheet is a summary. It may not cover all possible information. If you have questions about this medicine, talk to your doctor, pharmacist, or health care provider.  2023 Elsevier/Gold Standard (2021-08-15 00:00:00)    Bortezomib Injection What is this medication? BORTEZOMIB (bor TEZ oh mib) treats lymphoma. It may also be used to treat multiple myeloma, a type of bone marrow cancer. It works by blocking a protein that causes cancer cells to grow and multiply. This helps to slow or stop the spread of cancer cells. This medicine may be used for other purposes; ask your health care provider or pharmacist if you have questions. COMMON BRAND NAME(S): Velcade What should I tell my care team before I take this medication? They need to  know if you have any of these conditions: Dehydration Diabetes Heart disease Liver disease Tingling of the fingers or toes or other nerve disorder An unusual or allergic reaction to bortezomib, other medications, foods, dyes, or preservatives If you or your partner are pregnant or trying to get pregnant Breastfeeding How should I use this medication? This medication is injected into a vein or under the skin. It is given by your care team in a hospital or clinic setting. Talk to your care team about the use of this medication in children. Special care may be needed. Overdosage: If you think you have taken too much of this medicine contact a poison control center or emergency room at once. NOTE: This medicine is only for you. Do not share this medicine with others. What if I miss a dose? Keep appointments for follow-up doses. It is important not to miss your dose. Call your care team if you are unable to keep an appointment. What may interact with this medication? Ketoconazole Rifampin This list may not describe all possible interactions. Give your health care provider a list of all the medicines, herbs, non-prescription drugs, or dietary supplements you use. Also tell them if you smoke, drink alcohol, or use illegal drugs. Some items may interact with your medicine. What should I watch for while using this medication? Your condition will  be monitored carefully while you are receiving this medication. You may need blood work while taking this medication. This medication may affect your coordination, reaction time, or judgment. Do not drive or operate machinery until you know how this medication affects you. Sit up or stand slowly to reduce the risk of dizzy or fainting spells. Drinking alcohol with this medication can increase the risk of these side effects. This medication may increase your risk of getting an infection. Call your care team for advice if you get a fever, chills, sore throat, or  other symptoms of a cold or flu. Do not treat yourself. Try to avoid being around people who are sick. Check with your care team if you have severe diarrhea, nausea, and vomiting, or if you sweat a lot. The loss of too much body fluid may make it dangerous for you to take this medication. Talk to your care team if you may be pregnant. Serious birth defects can occur if you take this medication during pregnancy and for 7 months after the last dose. You will need a negative pregnancy test before starting this medication. Contraception is recommended while taking this medication and for 7 months after the last dose. Your care team can help you find the option that works for you. If your partner can get pregnant, use a condom during sex while taking this medication and for 4 months after the last dose. Do not breastfeed while taking this medication and for 2 months after the last dose. This medication may cause infertility. Talk to your care team if you are concerned about your fertility. What side effects may I notice from receiving this medication? Side effects that you should report to your care team as soon as possible: Allergic reactions--skin rash, itching, hives, swelling of the face, lips, tongue, or throat Bleeding--bloody or black, tar-like stools, vomiting blood or Dmiya Malphrus material that looks like coffee grounds, red or dark Emaad Nanna urine, small red or purple spots on skin, unusual bruising or bleeding Bleeding in the brain--severe headache, stiff neck, confusion, dizziness, change in vision, numbness or weakness of the face, arm, or leg, trouble speaking, trouble walking, vomiting Bowel blockage--stomach cramping, unable to have a bowel movement or pass gas, loss of appetite, vomiting Heart failure--shortness of breath, swelling of the ankles, feet, or hands, sudden weight gain, unusual weakness or fatigue Infection--fever, chills, cough, sore throat, wounds that don't heal, pain or trouble when  passing urine, general feeling of discomfort or being unwell Liver injury--right upper belly pain, loss of appetite, nausea, light-colored stool, dark yellow or Briella Hobday urine, yellowing skin or eyes, unusual weakness or fatigue Low blood pressure--dizziness, feeling faint or lightheaded, blurry vision Lung injury--shortness of breath or trouble breathing, cough, spitting up blood, chest pain, fever Pain, tingling, or numbness in the hands or feet Severe or prolonged diarrhea Stomach pain, bloody diarrhea, pale skin, unusual weakness or fatigue, decrease in the amount of urine, which may be signs of hemolytic uremic syndrome Sudden and severe headache, confusion, change in vision, seizures, which may be signs of posterior reversible encephalopathy syndrome (PRES) TTP--purple spots on the skin or inside the mouth, pale skin, yellowing skin or eyes, unusual weakness or fatigue, fever, fast or irregular heartbeat, confusion, change in vision, trouble speaking, trouble walking Tumor lysis syndrome (TLS)--nausea, vomiting, diarrhea, decrease in the amount of urine, dark urine, unusual weakness or fatigue, confusion, muscle pain or cramps, fast or irregular heartbeat, joint pain Side effects that usually do not require medical attention (report  to your care team if they continue or are bothersome): Constipation Diarrhea Fatigue Loss of appetite Nausea This list may not describe all possible side effects. Call your doctor for medical advice about side effects. You may report side effects to FDA at 1-800-FDA-1088. Where should I keep my medication? This medication is given in a hospital or clinic. It will not be stored at home. NOTE: This sheet is a summary. It may not cover all possible information. If you have questions about this medicine, talk to your doctor, pharmacist, or health care provider.  2023 Elsevier/Gold Standard (2021-09-19 00:00:00)        To help prevent nausea and vomiting after your  treatment, we encourage you to take your nausea medication as directed.  BELOW ARE SYMPTOMS THAT SHOULD BE REPORTED IMMEDIATELY: *FEVER GREATER THAN 100.4 F (38 C) OR HIGHER *CHILLS OR SWEATING *NAUSEA AND VOMITING THAT IS NOT CONTROLLED WITH YOUR NAUSEA MEDICATION *UNUSUAL SHORTNESS OF BREATH *UNUSUAL BRUISING OR BLEEDING *URINARY PROBLEMS (pain or burning when urinating, or frequent urination) *BOWEL PROBLEMS (unusual diarrhea, constipation, pain near the anus) TENDERNESS IN MOUTH AND THROAT WITH OR WITHOUT PRESENCE OF ULCERS (sore throat, sores in mouth, or a toothache) UNUSUAL RASH, SWELLING OR PAIN  UNUSUAL VAGINAL DISCHARGE OR ITCHING   Items with * indicate a potential emergency and should be followed up as soon as possible or go to the Emergency Department if any problems should occur.  Please show the CHEMOTHERAPY ALERT CARD or IMMUNOTHERAPY ALERT CARD at check-in to the Emergency Department and triage nurse.  Should you have questions after your visit or need to cancel or reschedule your appointment, please contact [facility] [phone]  and follow the prompts.  Office hours are 8:00 a.m. to 4:30 p.m. Monday - Friday. Please note that voicemails left after 4:00 p.m. may not be returned until the following business day.  We are closed weekends and major holidays. You have access to a nurse at all times for urgent questions. Please call the main number to the clinic [phone] and follow the prompts.  For any non-urgent questions, you may also contact your provider using MyChart. We now offer e-Visits for anyone 86 and older to request care online for non-urgent symptoms. For details visit [email or web address].   Also download the MyChart app! Go to the app store, search "MyChart", open the app, select [facility], and log in with your MyChart username and password.

## 2022-07-17 NOTE — Progress Notes (Signed)
Ok to treat with platelets of 78k  T.O. Dr Rhys Martini, PharmD

## 2022-07-17 NOTE — Progress Notes (Signed)
Patient presents today for Daratumumab infusion and Velcade injection.  Patient is in satisfactory condition with no new complaints voiced.  Vital signs are stable.  Labs reviewed.  Platelets today are 78.  MD made aware.  All other labs are within treatment parameters.  We will proceed with treatment per MD orders.   Patient tolerated treatment well with no complaints voiced.  Patient left ambulatory in stable condition.  Vital signs stable at discharge.  Follow up as scheduled.

## 2022-07-19 ENCOUNTER — Inpatient Hospital Stay: Payer: Medicare Other

## 2022-07-24 ENCOUNTER — Inpatient Hospital Stay: Payer: Medicare Other

## 2022-07-24 ENCOUNTER — Inpatient Hospital Stay: Payer: Medicare Other | Admitting: Hematology

## 2022-07-24 VITALS — BP 143/53 | HR 74 | Temp 98.1°F | Resp 16 | Wt 126.0 lb

## 2022-07-24 VITALS — BP 156/66 | HR 72 | Temp 97.1°F | Resp 16

## 2022-07-24 DIAGNOSIS — C9000 Multiple myeloma not having achieved remission: Secondary | ICD-10-CM

## 2022-07-24 DIAGNOSIS — Z5112 Encounter for antineoplastic immunotherapy: Secondary | ICD-10-CM | POA: Diagnosis not present

## 2022-07-24 DIAGNOSIS — Z95828 Presence of other vascular implants and grafts: Secondary | ICD-10-CM

## 2022-07-24 DIAGNOSIS — D6481 Anemia due to antineoplastic chemotherapy: Secondary | ICD-10-CM | POA: Diagnosis not present

## 2022-07-24 DIAGNOSIS — D630 Anemia in neoplastic disease: Secondary | ICD-10-CM | POA: Diagnosis not present

## 2022-07-24 DIAGNOSIS — Z79620 Long term (current) use of immunosuppressive biologic: Secondary | ICD-10-CM | POA: Diagnosis not present

## 2022-07-24 LAB — CBC WITH DIFFERENTIAL/PLATELET
Abs Immature Granulocytes: 0.02 10*3/uL (ref 0.00–0.07)
Basophils Absolute: 0 10*3/uL (ref 0.0–0.1)
Basophils Relative: 0 %
Eosinophils Absolute: 0.1 10*3/uL (ref 0.0–0.5)
Eosinophils Relative: 3 %
HCT: 27.2 % — ABNORMAL LOW (ref 39.0–52.0)
Hemoglobin: 9 g/dL — ABNORMAL LOW (ref 13.0–17.0)
Immature Granulocytes: 1 %
Lymphocytes Relative: 22 %
Lymphs Abs: 0.8 10*3/uL (ref 0.7–4.0)
MCH: 34.7 pg — ABNORMAL HIGH (ref 26.0–34.0)
MCHC: 33.1 g/dL (ref 30.0–36.0)
MCV: 105 fL — ABNORMAL HIGH (ref 80.0–100.0)
Monocytes Absolute: 0.7 10*3/uL (ref 0.1–1.0)
Monocytes Relative: 18 %
Neutro Abs: 2.1 10*3/uL (ref 1.7–7.7)
Neutrophils Relative %: 56 %
Platelets: 70 10*3/uL — ABNORMAL LOW (ref 150–400)
RBC: 2.59 MIL/uL — ABNORMAL LOW (ref 4.22–5.81)
RDW: 23.6 % — ABNORMAL HIGH (ref 11.5–15.5)
WBC: 3.7 10*3/uL — ABNORMAL LOW (ref 4.0–10.5)
nRBC: 0 % (ref 0.0–0.2)

## 2022-07-24 LAB — COMPREHENSIVE METABOLIC PANEL
ALT: 33 U/L (ref 0–44)
AST: 19 U/L (ref 15–41)
Albumin: 2.5 g/dL — ABNORMAL LOW (ref 3.5–5.0)
Alkaline Phosphatase: 52 U/L (ref 38–126)
Anion gap: 5 (ref 5–15)
BUN: 19 mg/dL (ref 8–23)
CO2: 24 mmol/L (ref 22–32)
Calcium: 8.4 mg/dL — ABNORMAL LOW (ref 8.9–10.3)
Chloride: 101 mmol/L (ref 98–111)
Creatinine, Ser: 0.7 mg/dL (ref 0.61–1.24)
GFR, Estimated: >60 mL/min (ref >60)
Glucose, Bld: 106 mg/dL — ABNORMAL HIGH (ref 70–99)
Potassium: 3.5 mmol/L (ref 3.5–5.1)
Sodium: 130 mmol/L — ABNORMAL LOW (ref 135–145)
Total Bilirubin: 0.4 mg/dL (ref 0.3–1.2)
Total Protein: 7.9 g/dL (ref 6.5–8.1)

## 2022-07-24 LAB — MAGNESIUM: Magnesium: 2 mg/dL (ref 1.7–2.4)

## 2022-07-24 LAB — SAMPLE TO BLOOD BANK

## 2022-07-24 MED ORDER — BORTEZOMIB CHEMO SQ INJECTION 3.5 MG (2.5MG/ML)
1.0000 mg/m2 | Freq: Once | INTRAMUSCULAR | Status: AC
  Administered 2022-07-24: 1.75 mg via SUBCUTANEOUS
  Filled 2022-07-24: qty 0.7

## 2022-07-24 MED ORDER — HEPARIN SOD (PORK) LOCK FLUSH 100 UNIT/ML IV SOLN
500.0000 [IU] | Freq: Once | INTRAVENOUS | Status: AC | PRN
  Administered 2022-07-24: 500 [IU]

## 2022-07-24 MED ORDER — SODIUM CHLORIDE 0.9% FLUSH
10.0000 mL | INTRAVENOUS | Status: DC | PRN
  Administered 2022-07-24: 10 mL

## 2022-07-24 MED ORDER — ONDANSETRON HCL 4 MG/2ML IJ SOLN
8.0000 mg | Freq: Once | INTRAMUSCULAR | Status: AC
  Administered 2022-07-24: 8 mg via INTRAVENOUS
  Filled 2022-07-24: qty 4

## 2022-07-24 MED ORDER — SODIUM CHLORIDE 0.9% FLUSH
10.0000 mL | Freq: Once | INTRAVENOUS | Status: AC
  Administered 2022-07-24: 10 mL via INTRAVENOUS

## 2022-07-24 MED ORDER — SODIUM CHLORIDE 0.9 % IV SOLN: Freq: Once | INTRAVENOUS | Status: AC

## 2022-07-24 MED ORDER — ACETAMINOPHEN 325 MG PO TABS
650.0000 mg | ORAL_TABLET | Freq: Once | ORAL | Status: AC
  Administered 2022-07-24: 650 mg via ORAL
  Filled 2022-07-24: qty 2

## 2022-07-24 MED ORDER — METHYLPREDNISOLONE SODIUM SUCC 125 MG IJ SOLR
100.0000 mg | Freq: Once | INTRAMUSCULAR | Status: AC
  Administered 2022-07-24: 100 mg via INTRAVENOUS
  Filled 2022-07-24: qty 2

## 2022-07-24 MED ORDER — SODIUM CHLORIDE 0.9 % IV SOLN
16.0000 mg/kg | Freq: Once | INTRAVENOUS | Status: AC
  Administered 2022-07-24: 900 mg via INTRAVENOUS
  Filled 2022-07-24: qty 40

## 2022-07-24 MED ORDER — SODIUM CHLORIDE 0.9% FLUSH: 10.0000 mL | Freq: Once | INTRAVENOUS | Status: AC

## 2022-07-24 MED ORDER — CETIRIZINE HCL 10 MG/ML IV SOLN
10.0000 mg | Freq: Once | INTRAVENOUS | Status: AC
  Administered 2022-07-24: 10 mg via INTRAVENOUS
  Filled 2022-07-24: qty 1

## 2022-07-24 NOTE — Progress Notes (Signed)
Patient presents today for chemotherapy, Daratumumab IV, infusion.  Patient is in satisfactory condition with no new complaints voiced.  Vital signs are stable.  Labs reviewed.  Platelets today are 70.  MD made aware.  All other labs are within treatment parameters.  We will proceed with treatment per MD orders.

## 2022-07-24 NOTE — Patient Instructions (Signed)
[facility]  Discharge Instructions: Thank you for choosing [facility] to provide your oncology and hematology care.  If you have a lab appointment with the [facility], please come in thru the Main Entrance and check in at the main information desk.  Wear comfortable clothing and clothing appropriate for easy access to any Portacath or PICC line.   We strive to give you quality time with your provider. You may need to reschedule your appointment if you arrive late (15 or more minutes).  Arriving late affects you and other patients whose appointments are after yours.  Also, if you miss three or more appointments without notifying the office, you may be dismissed from the clinic at the provider's discretion.      For prescription refill requests, have your pharmacy contact our office and allow 72 hours for refills to be completed.    Today you received the following chemotherapy and/or immunotherapy agents Darzalex and Velcade injection. Bortezomib Injection What is this medication? BORTEZOMIB (bor TEZ oh mib) treats lymphoma. It may also be used to treat multiple myeloma, a type of bone marrow cancer. It works by blocking a protein that causes cancer cells to grow and multiply. This helps to slow or stop the spread of cancer cells. This medicine may be used for other purposes; ask your health care provider or pharmacist if you have questions. COMMON BRAND NAME(S): Velcade What should I tell my care team before I take this medication? They need to know if you have any of these conditions: Dehydration Diabetes Heart disease Liver disease Tingling of the fingers or toes or other nerve disorder An unusual or allergic reaction to bortezomib, other medications, foods, dyes, or preservatives If you or your partner are pregnant or trying to get pregnant Breastfeeding How should I use this medication? This medication is injected into a vein or under the skin.  It is given by your care team in a hospital or clinic setting. Talk to your care team about the use of this medication in children. Special care may be needed. Overdosage: If you think you have taken too much of this medicine contact a poison control center or emergency room at once. NOTE: This medicine is only for you. Do not share this medicine with others. What if I miss a dose? Keep appointments for follow-up doses. It is important not to miss your dose. Call your care team if you are unable to keep an appointment. What may interact with this medication? Ketoconazole Rifampin This list may not describe all possible interactions. Give your health care provider a list of all the medicines, herbs, non-prescription drugs, or dietary supplements you use. Also tell them if you smoke, drink alcohol, or use illegal drugs. Some items may interact with your medicine. What should I watch for while using this medication? Your condition will be monitored carefully while you are receiving this medication. You may need blood work while taking this medication. This medication may affect your coordination, reaction time, or judgment. Do not drive or operate machinery until you know how this medication affects you. Sit up or stand slowly to reduce the risk of dizzy or fainting spells. Drinking alcohol with this medication can increase the risk of these side effects. This medication may increase your risk of getting an infection. Call your care team for advice if you get a fever, chills, sore throat, or other symptoms of a cold or flu. Do not treat yourself. Try to avoid  being around people who are sick. Check with your care team if you have severe diarrhea, nausea, and vomiting, or if you sweat a lot. The loss of too much body fluid may make it dangerous for you to take this medication. Talk to your care team if you may be pregnant. Serious birth defects can occur if you take this medication during pregnancy and  for 7 months after the last dose. You will need a negative pregnancy test before starting this medication. Contraception is recommended while taking this medication and for 7 months after the last dose. Your care team can help you find the option that works for you. If your partner can get pregnant, use a condom during sex while taking this medication and for 4 months after the last dose. Do not breastfeed while taking this medication and for 2 months after the last dose. This medication may cause infertility. Talk to your care team if you are concerned about your fertility. What side effects may I notice from receiving this medication? Side effects that you should report to your care team as soon as possible: Allergic reactions--skin rash, itching, hives, swelling of the face, lips, tongue, or throat Bleeding--bloody or black, tar-like stools, vomiting blood or brown material that looks like coffee grounds, red or dark brown urine, small red or purple spots on skin, unusual bruising or bleeding Bleeding in the brain--severe headache, stiff neck, confusion, dizziness, change in vision, numbness or weakness of the face, arm, or leg, trouble speaking, trouble walking, vomiting Bowel blockage--stomach cramping, unable to have a bowel movement or pass gas, loss of appetite, vomiting Heart failure--shortness of breath, swelling of the ankles, feet, or hands, sudden weight gain, unusual weakness or fatigue Infection--fever, chills, cough, sore throat, wounds that don't heal, pain or trouble when passing urine, general feeling of discomfort or being unwell Liver injury--right upper belly pain, loss of appetite, nausea, light-colored stool, dark yellow or brown urine, yellowing skin or eyes, unusual weakness or fatigue Low blood pressure--dizziness, feeling faint or lightheaded, blurry vision Lung injury--shortness of breath or trouble breathing, cough, spitting up blood, chest pain, fever Pain, tingling, or  numbness in the hands or feet Severe or prolonged diarrhea Stomach pain, bloody diarrhea, pale skin, unusual weakness or fatigue, decrease in the amount of urine, which may be signs of hemolytic uremic syndrome Sudden and severe headache, confusion, change in vision, seizures, which may be signs of posterior reversible encephalopathy syndrome (PRES) TTP--purple spots on the skin or inside the mouth, pale skin, yellowing skin or eyes, unusual weakness or fatigue, fever, fast or irregular heartbeat, confusion, change in vision, trouble speaking, trouble walking Tumor lysis syndrome (TLS)--nausea, vomiting, diarrhea, decrease in the amount of urine, dark urine, unusual weakness or fatigue, confusion, muscle pain or cramps, fast or irregular heartbeat, joint pain Side effects that usually do not require medical attention (report to your care team if they continue or are bothersome): Constipation Diarrhea Fatigue Loss of appetite Nausea This list may not describe all possible side effects. Call your doctor for medical advice about side effects. You may report side effects to FDA at 1-800-FDA-1088. Where should I keep my medication? This medication is given in a hospital or clinic. It will not be stored at home. NOTE: This sheet is a summary. It may not cover all possible information. If you have questions about this medicine, talk to your doctor, pharmacist, or health care provider.  2023 Elsevier/Gold Standard (2021-09-19 00:00:00) Daratumumab Injection What is this medication? DARATUMUMAB (  dar a toom ue mab) treats multiple myeloma, a type of bone marrow cancer. It works by helping your immune system slow or stop the spread of cancer cells. It is a monoclonal antibody. This medicine may be used for other purposes; ask your health care provider or pharmacist if you have questions. COMMON BRAND NAME(S): DARZALEX What should I tell my care team before I take this medication? They need to know if  you have any of these conditions: Hereditary fructose intolerance Infection, such as chickenpox, herpes, hepatitis B Lung or breathing disease, such as asthma, COPD An unusual or allergic reaction to daratumumab, sorbitol, other medications, foods, dyes, or preservatives Pregnant or trying to get pregnant Breastfeeding How should I use this medication? This medication is injected into a vein. It is given by your care team in a hospital or clinic setting. Talk to your care team about the use of this medication in children. Special care may be needed. Overdosage: If you think you have taken too much of this medicine contact a poison control center or emergency room at once. NOTE: This medicine is only for you. Do not share this medicine with others. What if I miss a dose? Keep appointments for follow-up doses. It is important not to miss your dose. Call your care team if you are unable to keep an appointment. What may interact with this medication? Interactions have not been studied. This list may not describe all possible interactions. Give your health care provider a list of all the medicines, herbs, non-prescription drugs, or dietary supplements you use. Also tell them if you smoke, drink alcohol, or use illegal drugs. Some items may interact with your medicine. What should I watch for while using this medication? Your condition will be monitored carefully while you are receiving this medication. This medication can cause serious allergic reactions. To reduce your risk, your care team may give you other medication to take before receiving this one. Be sure to follow the directions from your care team. This medication can affect the results of blood tests to match your blood type. These changes can last for up to 6 months after the final dose. Your care team will do blood tests to match your blood type before you start treatment. Tell all of your care team that you are being treated with this  medication before receiving a blood transfusion. This medication can affect the results of some tests used to determine treatment response; extra tests may be needed to evaluate response. Talk to your care team if you wish to become pregnant or think you are pregnant. This medication can cause serious birth defects if taken during pregnancy and for 3 months after the last dose. A reliable form of contraception is recommended while taking this medication and for 3 months after the last dose. Talk to your care team about effective forms of contraception. Do not breast-feed while taking this medication. What side effects may I notice from receiving this medication? Side effects that you should report to your care team as soon as possible: Allergic reactions--skin rash, itching, hives, swelling of the face, lips, tongue, or throat Infection--fever, chills, cough, sore throat, wounds that don't heal, pain or trouble when passing urine, general feeling of discomfort or being unwell Infusion reactions--chest pain, shortness of breath or trouble breathing, feeling faint or lightheaded Unusual bruising or bleeding Side effects that usually do not require medical attention (report to your care team if they continue or are bothersome): Constipation Diarrhea Fatigue Nausea  Pain, tingling, or numbness in the hands or feet Swelling of the ankles, hands, or feet This list may not describe all possible side effects. Call your doctor for medical advice about side effects. You may report side effects to FDA at 1-800-FDA-1088. Where should I keep my medication? This medication is given in a hospital or clinic. It will not be stored at home. NOTE: This sheet is a summary. It may not cover all possible information. If you have questions about this medicine, talk to your doctor, pharmacist, or health care provider.  2023 Elsevier/Gold Standard (2021-08-15 00:00:00)       To help prevent nausea and vomiting after  your treatment, we encourage you to take your nausea medication as directed.  BELOW ARE SYMPTOMS THAT SHOULD BE REPORTED IMMEDIATELY: *FEVER GREATER THAN 100.4 F (38 C) OR HIGHER *CHILLS OR SWEATING *NAUSEA AND VOMITING THAT IS NOT CONTROLLED WITH YOUR NAUSEA MEDICATION *UNUSUAL SHORTNESS OF BREATH *UNUSUAL BRUISING OR BLEEDING *URINARY PROBLEMS (pain or burning when urinating, or frequent urination) *BOWEL PROBLEMS (unusual diarrhea, constipation, pain near the anus) TENDERNESS IN MOUTH AND THROAT WITH OR WITHOUT PRESENCE OF ULCERS (sore throat, sores in mouth, or a toothache) UNUSUAL RASH, SWELLING OR PAIN  UNUSUAL VAGINAL DISCHARGE OR ITCHING   Items with * indicate a potential emergency and should be followed up as soon as possible or go to the Emergency Department if any problems should occur.  Please show the CHEMOTHERAPY ALERT CARD or IMMUNOTHERAPY ALERT CARD at check-in to the Emergency Department and triage nurse.  Should you have questions after your visit or need to cancel or reschedule your appointment, please contact [facility] [phone]  and follow the prompts.  Office hours are 8:00 a.m. to 4:30 p.m. Monday - Friday. Please note that voicemails left after 4:00 p.m. may not be returned until the following business day.  We are closed weekends and major holidays. You have access to a nurse at all times for urgent questions. Please call the main number to the clinic [phone] and follow the prompts.  For any non-urgent questions, you may also contact your provider using MyChart. We now offer e-Visits for anyone 87 and older to request care online for non-urgent symptoms. For details visit [email or web address].   Also download the MyChart app! Go to the app store, search "MyChart", open the app, select [facility], and log in with your MyChart username and password.

## 2022-07-24 NOTE — Progress Notes (Signed)
Treatment given today per MD orders. Tolerated infusion without adverse affects. Vital signs stable. No complaints at this time. Discharged from clinic ambulatory in stable condition. Alert and oriented x 3. F/U with [facility] as scheduled.   

## 2022-07-25 LAB — KAPPA/LAMBDA LIGHT CHAINS
Kappa free light chain: 4 mg/L (ref 3.3–19.4)
Kappa, lambda light chain ratio: 0.01 — ABNORMAL LOW (ref 0.26–1.65)
Lambda free light chains: 548.5 mg/L — ABNORMAL HIGH (ref 5.7–26.3)

## 2022-07-26 ENCOUNTER — Inpatient Hospital Stay: Payer: Medicare Other

## 2022-07-26 LAB — PROTEIN ELECTROPHORESIS, SERUM
A/G Ratio: 0.7 (ref 0.7–1.7)
Albumin ELP: 3.1 g/dL (ref 2.9–4.4)
Alpha-1-Globulin: 0.2 g/dL (ref 0.0–0.4)
Alpha-2-Globulin: 0.7 g/dL (ref 0.4–1.0)
Beta Globulin: 0.7 g/dL (ref 0.7–1.3)
Gamma Globulin: 3.1 g/dL — ABNORMAL HIGH (ref 0.4–1.8)
Globulin, Total: 4.7 g/dL — ABNORMAL HIGH (ref 2.2–3.9)
M-Spike, %: 2.9 g/dL — ABNORMAL HIGH
Total Protein ELP: 7.8 g/dL (ref 6.0–8.5)

## 2022-07-29 ENCOUNTER — Other Ambulatory Visit: Payer: Self-pay

## 2022-07-29 DIAGNOSIS — C9000 Multiple myeloma not having achieved remission: Secondary | ICD-10-CM

## 2022-07-31 ENCOUNTER — Inpatient Hospital Stay: Payer: Medicare Other

## 2022-07-31 VITALS — BP 150/55 | HR 78 | Temp 96.6°F | Resp 18 | Wt 127.8 lb

## 2022-07-31 DIAGNOSIS — Z5112 Encounter for antineoplastic immunotherapy: Secondary | ICD-10-CM | POA: Diagnosis not present

## 2022-07-31 DIAGNOSIS — C9000 Multiple myeloma not having achieved remission: Secondary | ICD-10-CM

## 2022-07-31 DIAGNOSIS — Z95828 Presence of other vascular implants and grafts: Secondary | ICD-10-CM

## 2022-07-31 DIAGNOSIS — D630 Anemia in neoplastic disease: Secondary | ICD-10-CM | POA: Diagnosis not present

## 2022-07-31 DIAGNOSIS — Z79620 Long term (current) use of immunosuppressive biologic: Secondary | ICD-10-CM | POA: Diagnosis not present

## 2022-07-31 DIAGNOSIS — D6481 Anemia due to antineoplastic chemotherapy: Secondary | ICD-10-CM | POA: Diagnosis not present

## 2022-07-31 LAB — COMPREHENSIVE METABOLIC PANEL
ALT: 33 U/L (ref 0–44)
AST: 18 U/L (ref 15–41)
Albumin: 2.6 g/dL — ABNORMAL LOW (ref 3.5–5.0)
Alkaline Phosphatase: 53 U/L (ref 38–126)
Anion gap: 3 — ABNORMAL LOW (ref 5–15)
BUN: 22 mg/dL (ref 8–23)
CO2: 25 mmol/L (ref 22–32)
Calcium: 8.2 mg/dL — ABNORMAL LOW (ref 8.9–10.3)
Chloride: 103 mmol/L (ref 98–111)
Creatinine, Ser: 0.8 mg/dL (ref 0.61–1.24)
GFR, Estimated: >60 mL/min (ref >60)
Glucose, Bld: 102 mg/dL — ABNORMAL HIGH (ref 70–99)
Potassium: 3.6 mmol/L (ref 3.5–5.1)
Sodium: 131 mmol/L — ABNORMAL LOW (ref 135–145)
Total Bilirubin: 0.8 mg/dL (ref 0.3–1.2)
Total Protein: 7.9 g/dL (ref 6.5–8.1)

## 2022-07-31 LAB — CBC WITH DIFFERENTIAL/PLATELET
Abs Immature Granulocytes: 0.02 10*3/uL (ref 0.00–0.07)
Basophils Absolute: 0 10*3/uL (ref 0.0–0.1)
Basophils Relative: 0 %
Eosinophils Absolute: 0.1 10*3/uL (ref 0.0–0.5)
Eosinophils Relative: 3 %
HCT: 26.3 % — ABNORMAL LOW (ref 39.0–52.0)
Hemoglobin: 8.7 g/dL — ABNORMAL LOW (ref 13.0–17.0)
Immature Granulocytes: 1 %
Lymphocytes Relative: 24 %
Lymphs Abs: 1 10*3/uL (ref 0.7–4.0)
MCH: 34.9 pg — ABNORMAL HIGH (ref 26.0–34.0)
MCHC: 33.1 g/dL (ref 30.0–36.0)
MCV: 105.6 fL — ABNORMAL HIGH (ref 80.0–100.0)
Monocytes Absolute: 0.6 10*3/uL (ref 0.1–1.0)
Monocytes Relative: 15 %
Neutro Abs: 2.5 10*3/uL (ref 1.7–7.7)
Neutrophils Relative %: 57 %
Platelets: 97 10*3/uL — ABNORMAL LOW (ref 150–400)
RBC: 2.49 MIL/uL — ABNORMAL LOW (ref 4.22–5.81)
WBC: 4.3 10*3/uL (ref 4.0–10.5)
nRBC: 0 % (ref 0.0–0.2)

## 2022-07-31 LAB — SAMPLE TO BLOOD BANK

## 2022-07-31 LAB — MAGNESIUM: Magnesium: 2.1 mg/dL (ref 1.7–2.4)

## 2022-07-31 LAB — PHOSPHORUS: Phosphorus: 2.9 mg/dL (ref 2.5–4.6)

## 2022-07-31 MED ORDER — HEPARIN SOD (PORK) LOCK FLUSH 100 UNIT/ML IV SOLN
500.0000 [IU] | Freq: Once | INTRAVENOUS | Status: AC
  Administered 2022-07-31: 500 [IU] via INTRAVENOUS

## 2022-07-31 MED ORDER — SODIUM CHLORIDE 0.9% FLUSH
10.0000 mL | Freq: Once | INTRAVENOUS | Status: AC
  Administered 2022-07-31: 10 mL via INTRAVENOUS

## 2022-07-31 NOTE — Progress Notes (Signed)
Port flushed with good blood return noted. No bruising or swelling at site. Bandaid applied and patient discharged in satisfactory condition. VVS stable with no signs or symptoms of distressed noted. 

## 2022-08-01 ENCOUNTER — Other Ambulatory Visit: Payer: Self-pay | Admitting: Cardiovascular Disease

## 2022-08-02 ENCOUNTER — Inpatient Hospital Stay: Payer: Medicare Other

## 2022-08-05 ENCOUNTER — Other Ambulatory Visit: Payer: Self-pay

## 2022-08-05 DIAGNOSIS — Z95828 Presence of other vascular implants and grafts: Secondary | ICD-10-CM

## 2022-08-06 NOTE — Progress Notes (Signed)
[name] [address] [place] [address]    Clinic Day:  08/07/2022  Referring physician: Derek Jack, MD  Patient Care Team: Pcp, No as PCP - General Troy Sine, MD as PCP - Cardiology (Cardiology) Troy Sine, MD as Consulting Physician (Cardiology) Gala Romney Cristopher Estimable, MD as Consulting Physician (Gastroenterology) Derek Jack, MD as Medical Oncologist (Oncology)   ASSESSMENT & PLAN:   Assessment: 1.  IgG lambda plasma cell myeloma: - Work-up for macrocytic anemia on 12/01/2020 showed M spike of 2.9 g.  Immunofixation IgG lambda. - Lambda light chains elevated at 284.  Light chain ratio was 0.04.  LDH was 163.  Creatinine was 0.9 and calcium 8.9. - Bone marrow biopsy on 12/18/2020-hypercellular marrow for age with 48% atypical plasma cells with lambda light chain restriction. - Chromosome analysis was normal. 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 - Revlimid 5 mg, 2 weeks on/1 week off started on 01/20/2021.  Dexamethasone weekly 10 mg added on 02/07/2021.  Revlimid is discontinued around 06/15/2021 due to poor tolerance and ineffectiveness at low-dose.  Thrombocytopenia and anemia. - Velcade weekly on days 1, 8, 15 every 21 days along with dexamethasone 10 mg weekly started on 06/25/2021. 2.  Macrocytic anemia: - CBC on 12/01/2020 with hemoglobin 8.5 and MCV of 117. - Denies any bleeding per rectum or melena.  3.  Social/family history: - Lives at home with his wife.  He does all ADLs and IADLs.  He even does yard work. - He worked at [employer] for 35 years.  Denies any chemical exposure.  Non-smoker. - No family history of malignancies.    Plan: 1.  IgG lambda plasma cell myeloma, high risk: - He has tolerated cycle 5 of Velcade and Darzalex 3 weeks on/1 week off very well. - Myeloma labs from 07/24/2022: M spike 2.9 g.  Lambda light chains 548.   Ratio is 0.01. - Labs today shows creatinine and LFTs are normal. - Continue with C6 today and return to clinic in 4 weeks with myeloma labs repeated.  2.  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  We will arrange for 1 unit of PRBC to keep it above 8.  Platelet count is 74.  3.  Hypophosphatemia: - Phosphate is normal at 2.8 today.  He is taking phosphate 200 mg daily.  He is having diarrhea alternating with constipation.  His son has already arranged pill pack for the next 10 days.  After that he will stop phosphate and we will recheck it.  4.  Right ankle swelling: - Continue Lasix daily as needed.  He has 1+ swelling.  5.  ID prophylaxis: - Continue acyclovir twice daily.  Continue aspirin 81 mg 3 times weekly.  Orders Placed This Encounter  Procedures   Urine culture    Standing Status:   Future    Number of Occurrences:   1    Standing Expiration Date:   08/07/2023   Urinalysis, Routine w reflex microscopic    Standing Status:   Future    Number of Occurrences:   1    Standing Expiration Date:   08/07/2023   Kappa/lambda light chains    Standing Status:   Future    Standing Expiration Date:   08/07/2023   Protein electrophoresis, serum    Standing Status:   Future    Standing Expiration Date:  08/07/2023   CBC    Standing Status:   Future    Standing Expiration Date:   08/07/2023      I,Katie Daubenspeck,acting as a scribe for Derek Jack, MD.,have documented all relevant documentation on the behalf of Derek Jack, MD,as directed by  Derek Jack, MD while in the presence of Derek Jack, MD.   I, Derek Jack MD, have reviewed the above documentation for accuracy and completeness, and I agree with the above.   Derek Jack, MD   4/3/20245:18 PM  CHIEF COMPLAINT:   Diagnosis: multiple myeloma and anemia    Cancer Staging  No matching staging information was found for the patient.   Prior  Therapy: none  Current Therapy:  Weekly Velcade and daratumumab    HISTORY OF PRESENT ILLNESS:   Oncology History  Multiple myeloma  01/15/2021 Initial Diagnosis   Multiple myeloma ([place])   06/25/2021 - 12/06/2021 Chemotherapy   Patient is on Treatment Plan : MYELOMA NON-TRANSPLANT CANDIDATES VRd weekly q21d     12/20/2021 -  Chemotherapy   Patient is on Treatment Plan : MYELOMA Daratumumab IV q28d        INTERVAL HISTORY:   Dale [name] is a 89 y.o. male presenting to clinic today for follow up of multiple myeloma and anemia. He was last seen by me on 07/10/22.  Today, he states that he is doing well overall. His appetite level is at 100%. His energy level is at 75%.  PAST MEDICAL HISTORY:   Past Medical History: Past Medical History:  Diagnosis Date   Allergic rhinitis    Anal fissure    Anginal pain    Asthma    Cancer    skin   Chest pain    CHF (congestive heart failure)    Coronary heart disease    s/p stenting. cath in 01/2012 noncritical occlusion   Dyspnea    Dysrhythmia    1st degree heart block   GERD (gastroesophageal reflux disease)    Glaucoma    Hiatal hernia    Hyperlipidemia    Hypertension    Hypothyroidism    Idiopathic thrombocytopenic purpura 2002   Macular degeneration    Multiple myeloma 01/15/2021   Nephrolithiasis    PUD (peptic ulcer disease)    remote   Sarcoidosis    pulmonary   Schatzki's ring     Surgical History: Past Surgical History:  Procedure Laterality Date   cardiac stents     COLONOSCOPY  10/30/2006   Normal rectum, sigmoid diverticula.Remainder of colonic mucosa appeared normal.   CORONARY ANGIOPLASTY WITH STENT PLACEMENT     about 10 years ago per pt (around 2007)   [place], URETEROSCOPY AND STENT PLACEMENT Left 06/16/2017   Procedure: CYSTOSCOPY WITH RETROGRADE PYELOGRAM, URETEROSCOPY,STONE EXTRACTION  AND STENT PLACEMENT;  Surgeon: Franchot Gallo, MD;  Location: [facility];  Service: Urology;   Laterality: Left;   ESOPHAGOGASTRODUODENOSCOPY  06/19/2004   Two esophageal rings and esophageal web as described above.  All of these were disrupted by passing 56-French Venia Minks dilator/ Candida esophagitis,which appears to be incidental given history of   antibiotic use, but nevertheless will be treated.   ESOPHAGOGASTRODUODENOSCOPY  10/30/2006   Distal tandem esophageal ring status post dilation disruption as  described above.  Otherwise normal esophagus/  Small hiatal hernia otherwise normal stomach, D1 and D2   ESOPHAGOGASTRODUODENOSCOPY N/A 03/22/2015   Dr.Rourk- noncritical schatzki's ring and hiatal hernia-o/w normal EGD.    ESOPHAGOGASTRODUODENOSCOPY (EGD) WITH ESOPHAGEAL DILATION  03/04/2012   RMR- schatzki's ring, hiatal hernia, polypoid gastric mucosa, bx= minimally active gastritis.   HOLMIUM LASER APPLICATION Left [id number]   Procedure: HOLMIUM LASER APPLICATION;  Surgeon: Franchot Gallo, MD;  Location: [facility];  Service: Urology;  Laterality: Left;   IR CV LINE INJECTION  12/27/2021   IR GENERIC HISTORICAL  03/06/2016   IR RADIOLOGIST EVAL & MGMT 03/06/2016 Aletta Edouard, MD [facility] INTERV RAD   IR GENERIC HISTORICAL  06/18/2016   IR RADIOLOGIST EVAL & MGMT 06/18/2016 Aletta Edouard, MD [facility] INTER[facility] RAD   IR IMAGING GUIDED PORT INSERTION  12/18/2021   IR IMAGING GUIDED PORT INSERTION  01/15/2022   IR RADIOLOGIST EVAL & MGMT  10/01/2016   IR RADIOLOGIST EVAL & MGMT  10/15/2017   IR RADIOLOGIST EVAL & MGMT  12/24/2018   IR RADIOLOGIST EVAL & MGMT  01/04/2020   IR RADIOLOGIST EVAL & MGMT  06/20/2021   IR REMOVAL TUN ACCESS W/ PORT W/O FL MOD SED  01/15/2022   LEFT HEART CATH N/A 02/02/2012   Procedure: LEFT HEART CATH;  Surgeon: Lorretta Harp, MD;  Location: [facility] CATH LAB;  Service: Cardiovascular;  Laterality: N/A;   MEDIASTINOSCOPY     for dx sarcoid   RADIOLOGY WITH ANESTHESIA Left 05/17/2016   Procedure: left renal ablation;  Surgeon: Aletta Edouard, MD;  Location: [facility];   Service: Radiology;  Laterality: Left;    Social History: 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    Employer: RETIRED  Tobacco Use   Smoking status: Former    Packs/day: 0.10    Years: 2.00    Additional pack years: 0.00    Total pack years: 0.20    Types: Cigarettes, Cigars    Quit date: 05/06/1970    Years since quitting: 52.2   Smokeless tobacco: Never  Vaping Use   Vaping Use: Never used  Substance and Sexual Activity   Alcohol use: No    Alcohol/week: 0.0 standard drinks of alcohol   Drug use: No   Sexual activity: Never  Other Topics Concern   Not on file  Social History Narrative   Not on file   Social Determinants of Health   Financial Resource Strain: Not on file  Food Insecurity: No Food Insecurity (06/04/2022)   Hunger Vital Sign    Worried About Running Out of Food in the Last Year: Never true    Ran Out of Food in the Last Year: Never true  Transportation Needs: No Transportation Needs (06/04/2022)   PRAPARE - Hydrologist (Medical): No    Lack of Transportation (Non-Medical): No  Physical Activity: Not on file  Stress: Not on file  Social Connections: Not on file  Intimate Partner Violence: Not At Risk (06/04/2022)   Humiliation, Afraid, Rape, and Kick questionnaire    Fear of Current or Ex-Partner: No    Emotionally Abused: No    Physically Abused: No    Sexually Abused: No    Family History: Family History  Problem Relation Age of Onset   Heart disease Father        deceased age 100   Stroke Mother    Alzheimer's disease Mother  Heart attack Brother        deceased age 5   Cancer Other        niece   Colon cancer Neg Hx     Current Medications:  Current Outpatient  Medications:    acetaminophen (TYLENOL) 325 MG tablet, Take 2 tablets (650 mg total) by mouth every 6 (six) hours as needed for mild pain (or Fever >/= 101)., Disp: , Rfl:    acyclovir (ZOVIRAX) 400 MG tablet, TAKE 1 TABLET BY MOUTH TWICE  DAILY, Disp: 180 tablet, Rfl: 0   Ascorbic Acid (VITAMIN C PO), Take 500 mg by mouth every evening., Disp: , Rfl:    aspirin EC 81 MG tablet, Take 1 tablet (81 mg total) by mouth daily with breakfast. For 30 days Only (Patient taking differently: Take 81 mg by mouth daily with breakfast. Every Monday, Wednesday, and Friday), Disp: 30 tablet, Rfl: 0   atorvastatin (LIPITOR) 40 MG tablet, TAKE 1 TABLET BY MOUTH IN  THE EVENING (Patient taking differently: Take 40 mg by mouth daily.), Disp: 90 tablet, Rfl: 3   Bortezomib (VELCADE IJ), Inject as directed once a week., Disp: , Rfl:    brimonidine (ALPHAGAN) 0.2 % ophthalmic solution, Place 1 drop into the left eye 2 (two) times daily., Disp: , Rfl:    cyanocobalamin 1000 MCG tablet, Take 1 tablet (1,000 mcg total) by mouth daily., Disp: 30 tablet, Rfl: 3   dexamethasone (DECADRON) 2 MG tablet, TAKE 5 TABLETS BY MOUTH ONCE  WEEKLY, Disp: 60 tablet, Rfl: 0   dorzolamide-timolol (COSOPT) 22.3-6.8 MG/ML ophthalmic solution, Place 1 drop into the left eye 2 (two) times daily., Disp: , Rfl:    feeding supplement (ENSURE ENLIVE / ENSURE PLUS) LIQD, Take 237 mLs by mouth 3 (three) times daily between meals., Disp: , Rfl:    furosemide (LASIX) 20 MG tablet, TAKE 2 TABLETS BY MOUTH DAILY, IF YOU NOTICE ANY SWELLING., Disp: 90 tablet, Rfl: 1   isosorbide mononitrate (IMDUR) 60 MG 24 hr tablet, Take 1 tablet (60 mg total) by mouth daily. TAKE 1 AND 1/2 TABLETS BY  MOUTH IN THE MORNING AND  1/2 TABLET AT NIGHT (Patient taking differently: Take 60 mg by mouth daily. TAKE 1 TABLETS BY  MOUTH IN THE MORNING AND  1/2 TABLET AT NIGHT), Disp: , Rfl:    levothyroxine (SYNTHROID) 75 MCG tablet, Take 75 mcg by mouth daily before breakfast.,  Disp: , Rfl:    lidocaine-prilocaine (EMLA) cream, Apply 1 Application topically as needed., Disp: 30 g, Rfl: 1   losartan (COZAAR) 100 MG tablet, TAKE 1 TABLET BY MOUTH DAILY (Patient taking differently: Take 100 mg by mouth daily.), Disp: 90 tablet, Rfl: 3   metoprolol tartrate (LOPRESSOR) 25 MG tablet, TAKE 1 TABLET BY MOUTH IN  THE MORNING AND ONE-HALF  TABLET BY MOUTH IN THE  EVENING (Patient taking differently: Take 12.5-25 mg by mouth in the morning and at bedtime. TAKE 1 TABLET BY MOUTH IN  THE MORNING AND ONE-HALF  TABLET BY MOUTH IN THE  EVENING), Disp: 135 tablet, Rfl: 3   Multiple Vitamins-Minerals (PRESERVISION/LUTEIN) CAPS, Take 1 capsule by mouth 2 (two) times daily., Disp: , Rfl:    nitroGLYCERIN (NITROSTAT) 0.4 MG SL tablet, Place 1 tablet (0.4 mg total) under the tongue every 5 (five) minutes as needed. For chest pain, Disp: 25 tablet, Rfl: 2   pantoprazole (PROTONIX) 40 MG tablet, TAKE 1 TABLET BY MOUTH DAILY  BEFORE BREAKFAST (Patient taking differently: Take 40 mg by  mouth daily.), Disp: 90 tablet, Rfl: 3   potassium chloride SA (KLOR-CON M) 20 MEQ tablet, Take 20 mEq by mouth daily., Disp: , Rfl:    potassium phosphate, monobasic, (PHOSPHO-TRIN K500) 500 MG tablet, Take 1 tablet (500 mg total) by mouth 2 (two) times daily with a meal., Disp: 60 tablet, Rfl: 1   prochlorperazine (COMPAZINE) 10 MG tablet, Take 1 tablet (10 mg total) by mouth every 6 (six) hours as needed for nausea or vomiting., Disp: 30 tablet, Rfl: 3   ROCKLATAN 0.02-0.005 % SOLN, Place 1 drop into the left eye at bedtime., Disp: , Rfl:    triamcinolone (KENALOG) 0.1 % cream, Apply 1 application. topically 2 (two) times daily as needed (for irritation)., Disp: , Rfl:    vitamin E 200 UNIT capsule, Take 200 Units by mouth every evening., Disp: , Rfl:  No current facility-administered medications for this visit.  Facility-Administered Medications Ordered in Other Visits:    acetaminophen (TYLENOL) 325 MG tablet, ,  , ,    diphenhydrAMINE (BENADRYL) 25 mg capsule, , , ,    montelukast (SINGULAIR) 10 MG tablet, , , ,    sodium chloride flush (NS) 0.9 % injection 10 mL, 10 mL, Intracatheter, PRN, Derek Jack, MD, 10 mL at 08/07/22 1219   Allergies: Allergies  Allergen Reactions   Azithromycin Other (See Comments)    Sore mouth and fever blisters around mouth, sores in nose area as well   Doxazosin Shortness Of Breath   Atenolol Other (See Comments)    UNKNOWN REACTION   Hydrocodone Nausea And Vomiting   Levofloxacin Other (See Comments)    Caused stomach problems.   Morphine Other (See Comments)    "made me crazy"   Penicillins Nausea And Vomiting and Other (See Comments)    Has patient had a PCN reaction causing immediate rash, facial/tongue/throat swelling, SOB or lightheadedness with hypotension: No Has patient had a PCN reaction causing severe rash involving mucus membranes or skin necrosis: No Has patient had a PCN reaction that required hospitalization No Has patient had a PCN reaction occurring within the last 10 years: No If all of the above answers are "NO", then may proceed with Cephalosporin use.    Sulfonamide Derivatives Nausea And Vomiting    REVIEW OF SYSTEMS:   Review of Systems - Oncology   VITALS:   Blood pressure (!) 153/64.  Wt Readings from Last 3 Encounters:  08/07/22 131 lb (59.4 kg)  07/31/22 127 lb 12.8 oz (58 kg)  07/24/22 126 lb (57.2 kg)    There is no height or weight on file to calculate BMI.  Performance status (ECOG): 1 - Symptomatic but completely ambulatory  PHYSICAL EXAM:   Physical Exam Vitals reviewed.  Constitutional:      Appearance: Normal appearance.  Cardiovascular:     Rate and Rhythm: Normal rate and regular rhythm.     Heart sounds: Normal heart sounds.  Pulmonary:     Effort: Pulmonary effort is normal.     Breath sounds: Normal breath sounds.  Abdominal:     Palpations: Abdomen is soft.  Musculoskeletal:     Right  lower leg: Edema present.     Left lower leg: Edema present.  Neurological:     Mental Status: He is alert.  Psychiatric:        Mood and Affect: Mood normal.        Behavior: Behavior normal.     LABS:      Latest Ref Rng &  Units 08/07/2022    7:57 AM 07/31/2022   10:39 AM 07/24/2022    8:06 AM  CBC  WBC 4.0 - 10.5 K/uL 6.8  4.3  3.7   Hemoglobin 13.0 - 17.0 g/dL 7.7  8.7  9.0   Hematocrit 39.0 - 52.0 % 23.3  26.3  27.2   Platelets 150 - 400 K/uL 74  97  70       Latest Ref Rng & Units 08/07/2022    7:57 AM 07/31/2022   10:39 AM 07/24/2022    8:06 AM  CMP  Glucose 70 - 99 mg/dL 94  102  106   BUN 8 - 23 mg/dL 24  22  19    Creatinine 0.61 - 1.24 mg/dL 0.78  0.80  0.70   Sodium 135 - 145 mmol/L 133  131  130   Potassium 3.5 - 5.1 mmol/L 3.6  3.6  3.5   Chloride 98 - 111 mmol/L 105  103  101   CO2 22 - 32 mmol/L 23  25  24    Calcium 8.9 - 10.3 mg/dL 8.4  8.2  8.4   Total Protein 6.5 - 8.1 g/dL 7.8  7.9  7.9   Total Bilirubin 0.3 - 1.2 mg/dL 0.7  0.8  0.4   Alkaline Phos 38 - 126 U/L 54  53  52   AST 15 - 41 U/L 15  18  19    ALT 0 - 44 U/L 24  33  33      No results found for: "CEA1", "CEA" / No results found for: "CEA1", "CEA" No results found for: "PSA1" No results found for: "CAN199" No results found for: "CAN125"  Lab Results  Component Value Date   TOTALPROTELP 7.8 07/24/2022   ALBUMINELP 3.1 07/24/2022   A1GS 0.2 07/24/2022   A2GS 0.7 07/24/2022   BETS 0.7 07/24/2022   GAMS 3.1 (H) 07/24/2022   MSPIKE 2.9 (H) 07/24/2022   SPEI Comment 07/24/2022   Lab Results  Component Value Date   TIBC 257 08/23/2021   TIBC 298 02/07/2021   TIBC 263 05/27/2017   FERRITIN 1,415 (H) 01/24/2022   FERRITIN 939 (H) 08/23/2021   FERRITIN 123 02/07/2021   IRONPCTSAT 90 (H) 08/23/2021   IRONPCTSAT 17 (L) 02/07/2021   IRONPCTSAT 44 05/27/2017   Lab Results  Component Value Date   LDH 115 05/01/2021   LDH 120 04/09/2021   LDH 124 04/02/2021     STUDIES:   No  results found.

## 2022-08-07 ENCOUNTER — Inpatient Hospital Stay: Payer: Medicare Other

## 2022-08-07 ENCOUNTER — Inpatient Hospital Stay (HOSPITAL_BASED_OUTPATIENT_CLINIC_OR_DEPARTMENT_OTHER): Payer: Medicare Other | Admitting: Hematology

## 2022-08-07 ENCOUNTER — Encounter: Payer: Self-pay | Admitting: Hematology

## 2022-08-07 ENCOUNTER — Inpatient Hospital Stay: Payer: Medicare Other | Attending: Hematology

## 2022-08-07 VITALS — BP 164/68 | HR 89 | Temp 97.8°F | Resp 18

## 2022-08-07 VITALS — BP 153/64

## 2022-08-07 DIAGNOSIS — R300 Dysuria: Secondary | ICD-10-CM | POA: Insufficient documentation

## 2022-08-07 DIAGNOSIS — Z7969 Long term (current) use of other immunomodulators and immunosuppressants: Secondary | ICD-10-CM | POA: Diagnosis not present

## 2022-08-07 DIAGNOSIS — D6481 Anemia due to antineoplastic chemotherapy: Secondary | ICD-10-CM | POA: Diagnosis not present

## 2022-08-07 DIAGNOSIS — D539 Nutritional anemia, unspecified: Secondary | ICD-10-CM | POA: Diagnosis not present

## 2022-08-07 DIAGNOSIS — Z5112 Encounter for antineoplastic immunotherapy: Secondary | ICD-10-CM | POA: Insufficient documentation

## 2022-08-07 DIAGNOSIS — C9000 Multiple myeloma not having achieved remission: Secondary | ICD-10-CM | POA: Diagnosis not present

## 2022-08-07 DIAGNOSIS — Z79620 Long term (current) use of immunosuppressive biologic: Secondary | ICD-10-CM | POA: Diagnosis not present

## 2022-08-07 DIAGNOSIS — Z87891 Personal history of nicotine dependence: Secondary | ICD-10-CM | POA: Diagnosis not present

## 2022-08-07 LAB — URINALYSIS, ROUTINE W REFLEX MICROSCOPIC
Bilirubin Urine: NEGATIVE
Glucose, UA: NEGATIVE mg/dL
Hgb urine dipstick: NEGATIVE
Ketones, ur: NEGATIVE mg/dL
Leukocytes,Ua: NEGATIVE
Nitrite: NEGATIVE
Protein, ur: NEGATIVE mg/dL
Specific Gravity, Urine: 1.016 (ref 1.005–1.030)
pH: 5 (ref 5.0–8.0)

## 2022-08-07 LAB — CBC WITH DIFFERENTIAL/PLATELET
Abs Immature Granulocytes: 0.04 10*3/uL (ref 0.00–0.07)
Basophils Absolute: 0 10*3/uL (ref 0.0–0.1)
Basophils Relative: 0 %
Eosinophils Absolute: 0.1 10*3/uL (ref 0.0–0.5)
Eosinophils Relative: 2 %
HCT: 23.3 % — ABNORMAL LOW (ref 39.0–52.0)
Hemoglobin: 7.7 g/dL — ABNORMAL LOW (ref 13.0–17.0)
Immature Granulocytes: 1 %
Lymphocytes Relative: 20 %
Lymphs Abs: 1.4 10*3/uL (ref 0.7–4.0)
MCH: 35.8 pg — ABNORMAL HIGH (ref 26.0–34.0)
MCHC: 33 g/dL (ref 30.0–36.0)
MCV: 108.4 fL — ABNORMAL HIGH (ref 80.0–100.0)
Monocytes Absolute: 0.8 10*3/uL (ref 0.1–1.0)
Monocytes Relative: 11 %
Neutro Abs: 4.5 10*3/uL (ref 1.7–7.7)
Neutrophils Relative %: 66 %
Platelets: 74 10*3/uL — ABNORMAL LOW (ref 150–400)
RBC: 2.15 MIL/uL — ABNORMAL LOW (ref 4.22–5.81)
WBC: 6.8 10*3/uL (ref 4.0–10.5)
nRBC: 0 % (ref 0.0–0.2)

## 2022-08-07 LAB — COMPREHENSIVE METABOLIC PANEL
ALT: 24 U/L (ref 0–44)
AST: 15 U/L (ref 15–41)
Albumin: 2.6 g/dL — ABNORMAL LOW (ref 3.5–5.0)
Alkaline Phosphatase: 54 U/L (ref 38–126)
Anion gap: 5 (ref 5–15)
BUN: 24 mg/dL — ABNORMAL HIGH (ref 8–23)
CO2: 23 mmol/L (ref 22–32)
Calcium: 8.4 mg/dL — ABNORMAL LOW (ref 8.9–10.3)
Chloride: 105 mmol/L (ref 98–111)
Creatinine, Ser: 0.78 mg/dL (ref 0.61–1.24)
GFR, Estimated: >60 mL/min (ref >60)
Glucose, Bld: 94 mg/dL (ref 70–99)
Potassium: 3.6 mmol/L (ref 3.5–5.1)
Sodium: 133 mmol/L — ABNORMAL LOW (ref 135–145)
Total Bilirubin: 0.7 mg/dL (ref 0.3–1.2)
Total Protein: 7.8 g/dL (ref 6.5–8.1)

## 2022-08-07 LAB — PREPARE RBC (CROSSMATCH)

## 2022-08-07 LAB — MAGNESIUM: Magnesium: 1.9 mg/dL (ref 1.7–2.4)

## 2022-08-07 LAB — PHOSPHORUS: Phosphorus: 2.8 mg/dL (ref 2.5–4.6)

## 2022-08-07 MED ORDER — HEPARIN SOD (PORK) LOCK FLUSH 100 UNIT/ML IV SOLN
500.0000 [IU] | Freq: Once | INTRAVENOUS | Status: AC | PRN
  Administered 2022-08-07: 500 [IU]

## 2022-08-07 MED ORDER — ACETAMINOPHEN 325 MG PO TABS
650.0000 mg | ORAL_TABLET | Freq: Once | ORAL | Status: AC
  Administered 2022-08-07: 650 mg via ORAL
  Filled 2022-08-07: qty 2

## 2022-08-07 MED ORDER — BORTEZOMIB CHEMO SQ INJECTION 3.5 MG (2.5MG/ML)
1.0000 mg/m2 | Freq: Once | INTRAMUSCULAR | Status: AC
  Administered 2022-08-07: 1.75 mg via SUBCUTANEOUS
  Filled 2022-08-07: qty 0.7

## 2022-08-07 MED ORDER — CETIRIZINE HCL 10 MG/ML IV SOLN
10.0000 mg | Freq: Once | INTRAVENOUS | Status: AC
  Administered 2022-08-07: 10 mg via INTRAVENOUS
  Filled 2022-08-07: qty 1

## 2022-08-07 MED ORDER — METHYLPREDNISOLONE SODIUM SUCC 125 MG IJ SOLR
100.0000 mg | Freq: Once | INTRAMUSCULAR | Status: AC
  Administered 2022-08-07: 100 mg via INTRAVENOUS
  Filled 2022-08-07: qty 2

## 2022-08-07 MED ORDER — SODIUM CHLORIDE 0.9 % IV SOLN: Freq: Once | INTRAVENOUS | Status: AC

## 2022-08-07 MED ORDER — SODIUM CHLORIDE 0.9% FLUSH
10.0000 mL | INTRAVENOUS | Status: DC | PRN
  Administered 2022-08-07: 10 mL

## 2022-08-07 MED ORDER — SODIUM CHLORIDE 0.9 % IV SOLN
16.0000 mg/kg | Freq: Once | INTRAVENOUS | Status: AC
  Administered 2022-08-07: 900 mg via INTRAVENOUS
  Filled 2022-08-07: qty 40

## 2022-08-07 MED ORDER — ONDANSETRON HCL 4 MG/2ML IJ SOLN
8.0000 mg | Freq: Once | INTRAMUSCULAR | Status: AC
  Administered 2022-08-07: 8 mg via INTRAVENOUS
  Filled 2022-08-07: qty 4

## 2022-08-07 NOTE — Patient Instructions (Signed)
[name]  Discharge Instructions: Thank you for choosing [facility] to provide your oncology and hematology care.  If you have a lab appointment with the [facility] - please note that after April 8th, 2024, all labs will be drawn in the cancer center.  You do not have to check in or register with the main entrance as you have in the past but will complete your check-in in the cancer center.  Wear comfortable clothing and clothing appropriate for easy access to any Portacath or PICC line.   We strive to give you quality time with your provider. You may need to reschedule your appointment if you arrive late (15 or more minutes).  Arriving late affects you and other patients whose appointments are after yours.  Also, if you miss three or more appointments without notifying the office, you may be dismissed from the clinic at the provider's discretion.      For prescription refill requests, have your pharmacy contact our office and allow 72 hours for refills to be completed.    Today you received the following chemotherapy and/or immunotherapy agents Daratumumab/Velcade.  Daratumumab Injection What is this medication? DARATUMUMAB (dar a toom ue mab) treats multiple myeloma, a type of bone marrow cancer. It works by helping your immune system slow or stop the spread of cancer cells. It is a monoclonal antibody. This medicine may be used for other purposes; ask your health care provider or pharmacist if you have questions. COMMON BRAND NAME(S): DARZALEX What should I tell my care team before I take this medication? They need to know if you have any of these conditions: Hereditary fructose intolerance Infection, such as chickenpox, herpes, hepatitis B Lung or breathing disease, such as asthma, COPD An unusual or allergic reaction to daratumumab, sorbitol, other medications, foods, dyes, or preservatives Pregnant or trying to get pregnant Breastfeeding How  should I use this medication? This medication is injected into a vein. It is given by your care team in a hospital or clinic setting. Talk to your care team about the use of this medication in children. Special care may be needed. Overdosage: If you think you have taken too much of this medicine contact a poison control center or emergency room at once. NOTE: This medicine is only for you. Do not share this medicine with others. What if I miss a dose? Keep appointments for follow-up doses. It is important not to miss your dose. Call your care team if you are unable to keep an appointment. What may interact with this medication? Interactions have not been studied. This list may not describe all possible interactions. Give your health care provider a list of all the medicines, herbs, non-prescription drugs, or dietary supplements you use. Also tell them if you smoke, drink alcohol, or use illegal drugs. Some items may interact with your medicine. What should I watch for while using this medication? Your condition will be monitored carefully while you are receiving this medication. This medication can cause serious allergic reactions. To reduce your risk, your care team may give you other medication to take before receiving this one. Be sure to follow the directions from your care team. This medication can affect the results of blood tests to match your blood type. These changes can last for up to 6 months after the final dose. Your care team will do blood tests to match your blood type before you start treatment. Tell all of your care team that you  are being treated with this medication before receiving a blood transfusion. This medication can affect the results of some tests used to determine treatment response; extra tests may be needed to evaluate response. Talk to your care team if you wish to become pregnant or think you are pregnant. This medication can cause serious birth defects if taken during  pregnancy and for 3 months after the last dose. A reliable form of contraception is recommended while taking this medication and for 3 months after the last dose. Talk to your care team about effective forms of contraception. Do not breast-feed while taking this medication. What side effects may I notice from receiving this medication? Side effects that you should report to your care team as soon as possible: Allergic reactions--skin rash, itching, hives, swelling of the face, lips, tongue, or throat Infection--fever, chills, cough, sore throat, wounds that don't heal, pain or trouble when passing urine, general feeling of discomfort or being unwell Infusion reactions--chest pain, shortness of breath or trouble breathing, feeling faint or lightheaded Unusual bruising or bleeding Side effects that usually do not require medical attention (report to your care team if they continue or are bothersome): Constipation Diarrhea Fatigue Nausea Pain, tingling, or numbness in the hands or feet Swelling of the ankles, hands, or feet This list may not describe all possible side effects. Call your doctor for medical advice about side effects. You may report side effects to FDA at 1-800-FDA-1088. Where should I keep my medication? This medication is given in a hospital or clinic. It will not be stored at home. NOTE: This sheet is a summary. It may not cover all possible information. If you have questions about this medicine, talk to your doctor, pharmacist, or health care provider.  2023 Elsevier/Gold Standard (2021-08-15 00:00:00)    Bortezomib Injection What is this medication? BORTEZOMIB (bor TEZ oh mib) treats lymphoma. It may also be used to treat multiple myeloma, a type of bone marrow cancer. It works by blocking a protein that causes cancer cells to grow and multiply. This helps to slow or stop the spread of cancer cells. This medicine may be used for other purposes; ask your health care provider  or pharmacist if you have questions. COMMON BRAND NAME(S): Velcade What should I tell my care team before I take this medication? They need to know if you have any of these conditions: Dehydration Diabetes Heart disease Liver disease Tingling of the fingers or toes or other nerve disorder An unusual or allergic reaction to bortezomib, other medications, foods, dyes, or preservatives If you or your partner are pregnant or trying to get pregnant Breastfeeding How should I use this medication? This medication is injected into a vein or under the skin. It is given by your care team in a hospital or clinic setting. Talk to your care team about the use of this medication in children. Special care may be needed. Overdosage: If you think you have taken too much of this medicine contact a poison control center or emergency room at once. NOTE: This medicine is only for you. Do not share this medicine with others. What if I miss a dose? Keep appointments for follow-up doses. It is important not to miss your dose. Call your care team if you are unable to keep an appointment. What may interact with this medication? Ketoconazole Rifampin This list may not describe all possible interactions. Give your health care provider a list of all the medicines, herbs, non-prescription drugs, or dietary supplements you use.  Also tell them if you smoke, drink alcohol, or use illegal drugs. Some items may interact with your medicine. What should I watch for while using this medication? Your condition will be monitored carefully while you are receiving this medication. You may need blood work while taking this medication. This medication may affect your coordination, reaction time, or judgment. Do not drive or operate machinery until you know how this medication affects you. Sit up or stand slowly to reduce the risk of dizzy or fainting spells. Drinking alcohol with this medication can increase the risk of these side  effects. This medication may increase your risk of getting an infection. Call your care team for advice if you get a fever, chills, sore throat, or other symptoms of a cold or flu. Do not treat yourself. Try to avoid being around people who are sick. Check with your care team if you have severe diarrhea, nausea, and vomiting, or if you sweat a lot. The loss of too much body fluid may make it dangerous for you to take this medication. Talk to your care team if you may be pregnant. Serious birth defects can occur if you take this medication during pregnancy and for 7 months after the last dose. You will need a negative pregnancy test before starting this medication. Contraception is recommended while taking this medication and for 7 months after the last dose. Your care team can help you find the option that works for you. If your partner can get pregnant, use a condom during sex while taking this medication and for 4 months after the last dose. Do not breastfeed while taking this medication and for 2 months after the last dose. This medication may cause infertility. Talk to your care team if you are concerned about your fertility. What side effects may I notice from receiving this medication? Side effects that you should report to your care team as soon as possible: Allergic reactions--skin rash, itching, hives, swelling of the face, lips, tongue, or throat Bleeding--bloody or black, tar-like stools, vomiting blood or Yonis Carreon material that looks like coffee grounds, red or dark Leilynn Pilat urine, small red or purple spots on skin, unusual bruising or bleeding Bleeding in the brain--severe headache, stiff neck, confusion, dizziness, change in vision, numbness or weakness of the face, arm, or leg, trouble speaking, trouble walking, vomiting Bowel blockage--stomach cramping, unable to have a bowel movement or pass gas, loss of appetite, vomiting Heart failure--shortness of breath, swelling of the ankles, feet, or  hands, sudden weight gain, unusual weakness or fatigue Infection--fever, chills, cough, sore throat, wounds that don't heal, pain or trouble when passing urine, general feeling of discomfort or being unwell Liver injury--right upper belly pain, loss of appetite, nausea, light-colored stool, dark yellow or Thelma Lorenzetti urine, yellowing skin or eyes, unusual weakness or fatigue Low blood pressure--dizziness, feeling faint or lightheaded, blurry vision Lung injury--shortness of breath or trouble breathing, cough, spitting up blood, chest pain, fever Pain, tingling, or numbness in the hands or feet Severe or prolonged diarrhea Stomach pain, bloody diarrhea, pale skin, unusual weakness or fatigue, decrease in the amount of urine, which may be signs of hemolytic uremic syndrome Sudden and severe headache, confusion, change in vision, seizures, which may be signs of posterior reversible encephalopathy syndrome (PRES) TTP--purple spots on the skin or inside the mouth, pale skin, yellowing skin or eyes, unusual weakness or fatigue, fever, fast or irregular heartbeat, confusion, change in vision, trouble speaking, trouble walking Tumor lysis syndrome (TLS)--nausea, vomiting, diarrhea, decrease in  the amount of urine, dark urine, unusual weakness or fatigue, confusion, muscle pain or cramps, fast or irregular heartbeat, joint pain Side effects that usually do not require medical attention (report to your care team if they continue or are bothersome): Constipation Diarrhea Fatigue Loss of appetite Nausea This list may not describe all possible side effects. Call your doctor for medical advice about side effects. You may report side effects to FDA at 1-800-FDA-1088. Where should I keep my medication? This medication is given in a hospital or clinic. It will not be stored at home. NOTE: This sheet is a summary. It may not cover all possible information. If you have questions about this medicine, talk to your doctor,  pharmacist, or health care provider.  2023 Elsevier/Gold Standard (2021-09-19 00:00:00)        To help prevent nausea and vomiting after your treatment, we encourage you to take your nausea medication as directed.  BELOW ARE SYMPTOMS THAT SHOULD BE REPORTED IMMEDIATELY: *FEVER GREATER THAN 100.4 F (38 C) OR HIGHER *CHILLS OR SWEATING *NAUSEA AND VOMITING THAT IS NOT CONTROLLED WITH YOUR NAUSEA MEDICATION *UNUSUAL SHORTNESS OF BREATH *UNUSUAL BRUISING OR BLEEDING *URINARY PROBLEMS (pain or burning when urinating, or frequent urination) *BOWEL PROBLEMS (unusual diarrhea, constipation, pain near the anus) TENDERNESS IN MOUTH AND THROAT WITH OR WITHOUT PRESENCE OF ULCERS (sore throat, sores in mouth, or a toothache) UNUSUAL RASH, SWELLING OR PAIN  UNUSUAL VAGINAL DISCHARGE OR ITCHING   Items with * indicate a potential emergency and should be followed up as soon as possible or go to the Emergency Department if any problems should occur.  Please show the CHEMOTHERAPY ALERT CARD or IMMUNOTHERAPY ALERT CARD at check-in to the Emergency Department and triage nurse.  Should you have questions after your visit or need to cancel or reschedule your appointment, please contact [facility] [phone]  and follow the prompts.  Office hours are 8:00 a.m. to 4:30 p.m. Monday - Friday. Please note that voicemails left after 4:00 p.m. may not be returned until the following business day.  We are closed weekends and major holidays. You have access to a nurse at all times for urgent questions. Please call the main number to the clinic [phone] and follow the prompts.  For any non-urgent questions, you may also contact your provider using MyChart. We now offer e-Visits for anyone 68 and older to request care online for non-urgent symptoms. For details visit [email or web address].   Also download the MyChart app! Go to the app store, search "MyChart", open the app, select [facility], and log in with your MyChart username and password.

## 2022-08-07 NOTE — Progress Notes (Signed)
Patient presents today for Daratumuamb infusion and Velcade injection.  Patient is in satisfactory condition with no new complaints voiced.  Vital signs are stable.  Labs reviewed by Dr. Delton Coombes during the office visit. Platelets today are 74 and hemoglobin is 7.7.  MD made aware.  We will give 1 unit of PRBC tomorrow per MD. Patient made aware also.   All other labs are within treatment parameters.  We will proceed with treatment per MD orders.   Patient tolerated treatment and injection well with no complaints voiced.  Patient left ambulatory in stable condition.  Vital signs stable at discharge.  Follow up as scheduled.

## 2022-08-07 NOTE — Patient Instructions (Signed)
[facility] Discharge Instructions   You were seen and examined today by Dr. Katragadda.  He reviewed the results of your lab work which are normal/stable.   We will proceed with your treatment today.  Return as scheduled.    Thank you for choosing [facility] at [facility] to provide your oncology and hematology care.  To afford each patient quality time with our provider, please arrive at least 15 minutes before your scheduled appointment time.   If you have a lab appointment with the Cancer Center please come in thru the Main Entrance and check in at the main information desk.  You need to re-schedule your appointment should you arrive 10 or more minutes late.  We strive to give you quality time with our providers, and arriving late affects you and other patients whose appointments are after yours.  Also, if you no show three or more times for appointments you may be dismissed from the clinic at the providers discretion.     Again, thank you for choosing [facility].  Our hope is that these requests will decrease the amount of time that you wait before being seen by our physicians.       _____________________________________________________________  Should you have questions after your visit to  Cancer Center, please contact our office at [phone] and follow the prompts.  Our office hours are 8:00 a.m. and 4:30 p.m. Monday - Friday.  Please note that voicemails left after 4:00 p.m. may not be returned until the following business day.  We are closed weekends and major holidays.  You do have access to a nurse 24-7, just call the main number to the clinic [phone] and do not press any options, hold on the line and a nurse will answer the phone.    For prescription refill requests, have your pharmacy contact our office and allow 72 hours.    Due to Covid, you will need to wear a mask upon entering  the hospital. If you do not have a mask, a mask will be given to you at the Main Entrance upon arrival. For doctor visits, patients may have 1 support person age 18 or older with them. For treatment visits, patients can not have anyone with them due to social distancing guidelines and our immunocompromised population.      

## 2022-08-08 ENCOUNTER — Other Ambulatory Visit: Payer: Self-pay

## 2022-08-08 ENCOUNTER — Inpatient Hospital Stay: Payer: Medicare Other

## 2022-08-08 DIAGNOSIS — Z87891 Personal history of nicotine dependence: Secondary | ICD-10-CM | POA: Diagnosis not present

## 2022-08-08 DIAGNOSIS — D539 Nutritional anemia, unspecified: Secondary | ICD-10-CM | POA: Diagnosis not present

## 2022-08-08 DIAGNOSIS — D6481 Anemia due to antineoplastic chemotherapy: Secondary | ICD-10-CM | POA: Diagnosis not present

## 2022-08-08 DIAGNOSIS — Z5112 Encounter for antineoplastic immunotherapy: Secondary | ICD-10-CM | POA: Diagnosis not present

## 2022-08-08 DIAGNOSIS — C9000 Multiple myeloma not having achieved remission: Secondary | ICD-10-CM | POA: Diagnosis not present

## 2022-08-08 DIAGNOSIS — R300 Dysuria: Secondary | ICD-10-CM | POA: Diagnosis not present

## 2022-08-08 LAB — URINE CULTURE: Culture: NO GROWTH

## 2022-08-08 MED ORDER — DIPHENHYDRAMINE HCL 25 MG PO CAPS
25.0000 mg | ORAL_CAPSULE | Freq: Once | ORAL | Status: AC
  Administered 2022-08-08: 25 mg via ORAL
  Filled 2022-08-08: qty 1

## 2022-08-08 MED ORDER — SODIUM CHLORIDE 0.9% FLUSH
10.0000 mL | INTRAVENOUS | Status: AC | PRN
  Administered 2022-08-08: 10 mL

## 2022-08-08 MED ORDER — HEPARIN SOD (PORK) LOCK FLUSH 100 UNIT/ML IV SOLN
500.0000 [IU] | Freq: Every day | INTRAVENOUS | Status: AC | PRN
  Administered 2022-08-08: 500 [IU]

## 2022-08-08 MED ORDER — SODIUM CHLORIDE 0.9% IV SOLUTION
250.0000 mL | Freq: Once | INTRAVENOUS | Status: AC
  Administered 2022-08-08: 250 mL via INTRAVENOUS

## 2022-08-08 MED ORDER — ACETAMINOPHEN 325 MG PO TABS
650.0000 mg | ORAL_TABLET | Freq: Once | ORAL | Status: AC
  Administered 2022-08-08: 650 mg via ORAL
  Filled 2022-08-08: qty 2

## 2022-08-08 NOTE — Progress Notes (Signed)
Patient presents today for blood transfusion. Patient is in satisfactory condition with no new complaints voiced.  Vital signs are stable.  We will proceed with infusion per provider orders.    Patient tolerated treatment well with no complaints voiced.  Patient left ambulatory in stable condition.  Vital signs stable at discharge.  Follow up as scheduled.

## 2022-08-08 NOTE — Patient Instructions (Signed)
[name]  Discharge Instructions: Thank you for choosing [facility] to provide your oncology and hematology care.  If you have a lab appointment with the [facility] - please note that after April 8th, 2024, all labs will be drawn in the cancer center.  You do not have to check in or register with the main entrance as you have in the past but will complete your check-in in the cancer center.  Wear comfortable clothing and clothing appropriate for easy access to any Portacath or PICC line.   We strive to give you quality time with your provider. You may need to reschedule your appointment if you arrive late (15 or more minutes).  Arriving late affects you and other patients whose appointments are after yours.  Also, if you miss three or more appointments without notifying the office, you may be dismissed from the clinic at the provider's discretion.      For prescription refill requests, have your pharmacy contact our office and allow 72 hours for refills to be completed.    Today you received the following blood transfusion x1 unit.   To help prevent nausea and vomiting after your treatment, we encourage you to take your nausea medication as directed.  BELOW ARE SYMPTOMS THAT SHOULD BE REPORTED IMMEDIATELY: *FEVER GREATER THAN 100.4 F (38 C) OR HIGHER *CHILLS OR SWEATING *NAUSEA AND VOMITING THAT IS NOT CONTROLLED WITH YOUR NAUSEA MEDICATION *UNUSUAL SHORTNESS OF BREATH *UNUSUAL BRUISING OR BLEEDING *URINARY PROBLEMS (pain or burning when urinating, or frequent urination) *BOWEL PROBLEMS (unusual diarrhea, constipation, pain near the anus) TENDERNESS IN MOUTH AND THROAT WITH OR WITHOUT PRESENCE OF ULCERS (sore throat, sores in mouth, or a toothache) UNUSUAL RASH, SWELLING OR PAIN  UNUSUAL VAGINAL DISCHARGE OR ITCHING   Items with * indicate a potential emergency and should be followed up as soon as possible or go to the Emergency Department if any  problems should occur.  Please show the CHEMOTHERAPY ALERT CARD or IMMUNOTHERAPY ALERT CARD at check-in to the Emergency Department and triage nurse.  Should you have questions after your visit or need to cancel or reschedule your appointment, please contact [facility] [phone]  and follow the prompts.  Office hours are 8:00 a.m. to 4:30 p.m. Monday - Friday. Please note that voicemails left after 4:00 p.m. may not be returned until the following business day.  We are closed weekends and major holidays. You have access to a nurse at all times for urgent questions. Please call the main number to the clinic [phone] and follow the prompts.  For any non-urgent questions, you may also contact your provider using MyChart. We now offer e-Visits for anyone 54 and older to request care online for non-urgent symptoms. For details visit [email or web address].   Also download the MyChart app! Go to the app store, search "MyChart", open the app, select [facility], and log in with your MyChart username and password.

## 2022-08-09 ENCOUNTER — Other Ambulatory Visit: Payer: Self-pay

## 2022-08-11 LAB — TYPE AND SCREEN
ABO/RH(D): O POS
Antibody Screen: POSITIVE
DAT, IgG: NEGATIVE
Unit division: 0
Unit division: 0

## 2022-08-11 LAB — BPAM RBC
Blood Product Expiration Date: 202405012359
Blood Product Expiration Date: 202405022359
ISSUE DATE / TIME: 202404041022
Unit Type and Rh: 9500
Unit Type and Rh: 9500

## 2022-08-14 ENCOUNTER — Inpatient Hospital Stay: Payer: Medicare Other

## 2022-08-14 VITALS — BP 181/74 | HR 69 | Temp 97.3°F | Resp 18

## 2022-08-14 DIAGNOSIS — D539 Nutritional anemia, unspecified: Secondary | ICD-10-CM | POA: Diagnosis not present

## 2022-08-14 DIAGNOSIS — Z87891 Personal history of nicotine dependence: Secondary | ICD-10-CM | POA: Diagnosis not present

## 2022-08-14 DIAGNOSIS — C9000 Multiple myeloma not having achieved remission: Secondary | ICD-10-CM

## 2022-08-14 DIAGNOSIS — Z5112 Encounter for antineoplastic immunotherapy: Secondary | ICD-10-CM | POA: Diagnosis not present

## 2022-08-14 DIAGNOSIS — D6481 Anemia due to antineoplastic chemotherapy: Secondary | ICD-10-CM | POA: Diagnosis not present

## 2022-08-14 DIAGNOSIS — E876 Hypokalemia: Secondary | ICD-10-CM

## 2022-08-14 DIAGNOSIS — R300 Dysuria: Secondary | ICD-10-CM | POA: Diagnosis not present

## 2022-08-14 LAB — SAMPLE TO BLOOD BANK

## 2022-08-14 LAB — CBC WITH DIFFERENTIAL/PLATELET
Abs Immature Granulocytes: 0.04 10*3/uL (ref 0.00–0.07)
Basophils Absolute: 0 10*3/uL (ref 0.0–0.1)
Basophils Relative: 0 %
Eosinophils Absolute: 0.1 10*3/uL (ref 0.0–0.5)
Eosinophils Relative: 2 %
HCT: 27.4 % — ABNORMAL LOW (ref 39.0–52.0)
Hemoglobin: 9 g/dL — ABNORMAL LOW (ref 13.0–17.0)
Immature Granulocytes: 1 %
Lymphocytes Relative: 26 %
Lymphs Abs: 1 10*3/uL (ref 0.7–4.0)
MCH: 34.4 pg — ABNORMAL HIGH (ref 26.0–34.0)
MCHC: 32.8 g/dL (ref 30.0–36.0)
MCV: 104.6 fL — ABNORMAL HIGH (ref 80.0–100.0)
Monocytes Absolute: 0.8 10*3/uL (ref 0.1–1.0)
Monocytes Relative: 19 %
Neutro Abs: 2.1 10*3/uL (ref 1.7–7.7)
Neutrophils Relative %: 52 %
Platelets: 80 10*3/uL — ABNORMAL LOW (ref 150–400)
RBC: 2.62 MIL/uL — ABNORMAL LOW (ref 4.22–5.81)
WBC: 4 10*3/uL (ref 4.0–10.5)
nRBC: 0 % (ref 0.0–0.2)

## 2022-08-14 LAB — COMPREHENSIVE METABOLIC PANEL
ALT: 26 U/L (ref 0–44)
AST: 16 U/L (ref 15–41)
Albumin: 2.6 g/dL — ABNORMAL LOW (ref 3.5–5.0)
Alkaline Phosphatase: 53 U/L (ref 38–126)
Anion gap: 4 — ABNORMAL LOW (ref 5–15)
BUN: 21 mg/dL (ref 8–23)
CO2: 24 mmol/L (ref 22–32)
Calcium: 8.3 mg/dL — ABNORMAL LOW (ref 8.9–10.3)
Chloride: 102 mmol/L (ref 98–111)
Creatinine, Ser: 0.78 mg/dL (ref 0.61–1.24)
GFR, Estimated: >60 mL/min (ref >60)
Glucose, Bld: 105 mg/dL — ABNORMAL HIGH (ref 70–99)
Potassium: 3.4 mmol/L — ABNORMAL LOW (ref 3.5–5.1)
Sodium: 130 mmol/L — ABNORMAL LOW (ref 135–145)
Total Bilirubin: 0.8 mg/dL (ref 0.3–1.2)
Total Protein: 7.9 g/dL (ref 6.5–8.1)

## 2022-08-14 LAB — PHOSPHORUS: Phosphorus: 2.1 mg/dL — ABNORMAL LOW (ref 2.5–4.6)

## 2022-08-14 LAB — MAGNESIUM: Magnesium: 2 mg/dL (ref 1.7–2.4)

## 2022-08-14 MED ORDER — CETIRIZINE HCL 10 MG/ML IV SOLN
10.0000 mg | Freq: Once | INTRAVENOUS | Status: AC
  Administered 2022-08-14: 10 mg via INTRAVENOUS
  Filled 2022-08-14: qty 1

## 2022-08-14 MED ORDER — ONDANSETRON HCL 4 MG/2ML IJ SOLN
8.0000 mg | Freq: Once | INTRAMUSCULAR | Status: AC
  Administered 2022-08-14: 8 mg via INTRAVENOUS
  Filled 2022-08-14: qty 4

## 2022-08-14 MED ORDER — ACETAMINOPHEN 325 MG PO TABS
650.0000 mg | ORAL_TABLET | Freq: Once | ORAL | Status: AC
  Administered 2022-08-14: 650 mg via ORAL
  Filled 2022-08-14: qty 2

## 2022-08-14 MED ORDER — SODIUM CHLORIDE 0.9 % IV SOLN: Freq: Once | INTRAVENOUS | Status: AC

## 2022-08-14 MED ORDER — POTASSIUM CHLORIDE CRYS ER 20 MEQ PO TBCR
40.0000 meq | EXTENDED_RELEASE_TABLET | Freq: Once | ORAL | Status: AC
  Administered 2022-08-14: 40 meq via ORAL
  Filled 2022-08-14: qty 2

## 2022-08-14 MED ORDER — SODIUM CHLORIDE 0.9% FLUSH
10.0000 mL | INTRAVENOUS | Status: DC | PRN
  Administered 2022-08-14: 10 mL

## 2022-08-14 MED ORDER — HEPARIN SOD (PORK) LOCK FLUSH 100 UNIT/ML IV SOLN
500.0000 [IU] | Freq: Once | INTRAVENOUS | Status: AC | PRN
  Administered 2022-08-14: 500 [IU]

## 2022-08-14 MED ORDER — SODIUM CHLORIDE 0.9 % IV SOLN
16.0000 mg/kg | Freq: Once | INTRAVENOUS | Status: AC
  Administered 2022-08-14: 900 mg via INTRAVENOUS
  Filled 2022-08-14: qty 40

## 2022-08-14 MED ORDER — BORTEZOMIB CHEMO SQ INJECTION 3.5 MG (2.5MG/ML)
1.0000 mg/m2 | Freq: Once | INTRAMUSCULAR | Status: AC
  Administered 2022-08-14: 1.75 mg via SUBCUTANEOUS
  Filled 2022-08-14: qty 0.7

## 2022-08-14 MED ORDER — METHYLPREDNISOLONE SODIUM SUCC 125 MG IJ SOLR
100.0000 mg | Freq: Once | INTRAMUSCULAR | Status: AC
  Administered 2022-08-14: 100 mg via INTRAVENOUS
  Filled 2022-08-14: qty 2

## 2022-08-14 NOTE — Progress Notes (Signed)
Blood pressure is 181/74. Md made aware and is ok to discharge patient per MD.

## 2022-08-14 NOTE — Progress Notes (Signed)
Patient presents today for chemotherapy infusion.  Patient is in satisfactory condition with no new complaints voiced.  Vital signs are stable.  Labs reviewed.  Platelets are 80.  MD aware.  All other labs are within treatment parameters. Potassium today is 3.4.  We will give Klor Con 40 mEq PO x one dose today per standing orders by Dr. Ellin Saba.  We will proceed with treatment per MD orders.    BP at completion of treatment was elevated (see previous note).  MD made aware.  No further treatment at this time per MD.  Patient tolerated treatment well with no complaints voiced.  Patient left via wheelchair in stable condition.  Vital signs stable at discharge.  Follow up as scheduled.

## 2022-08-14 NOTE — Patient Instructions (Signed)
[facility]  Discharge Instructions: Thank you for choosing [facility] to provide your oncology and hematology care.  If you have a lab appointment with the Cancer Center - please note that after April 8th, 2024, all labs will be drawn in the cancer center.  You do not have to check in or register with the main entrance as you have in the past but will complete your check-in in the cancer center.  Wear comfortable clothing and clothing appropriate for easy access to any Portacath or PICC line.   We strive to give you quality time with your provider. You may need to reschedule your appointment if you arrive late (15 or more minutes).  Arriving late affects you and other patients whose appointments are after yours.  Also, if you miss three or more appointments without notifying the office, you may be dismissed from the clinic at the provider's discretion.      For prescription refill requests, have your pharmacy contact our office and allow 72 hours for refills to be completed.    Today you received the following chemotherapy and/or immunotherapy agents Daratumumab/Velcade.  Daratumumab Injection What is this medication? DARATUMUMAB (dar a toom ue mab) treats multiple myeloma, a type of bone marrow cancer. It works by helping your immune system slow or stop the spread of cancer cells. It is a monoclonal antibody. This medicine may be used for other purposes; ask your health care provider or pharmacist if you have questions. COMMON BRAND NAME(S): DARZALEX What should I tell my care team before I take this medication? They need to know if you have any of these conditions: Hereditary fructose intolerance Infection, such as chickenpox, herpes, hepatitis B Lung or breathing disease, such as asthma, COPD An unusual or allergic reaction to daratumumab, sorbitol, other medications, foods, dyes, or preservatives Pregnant or trying to get pregnant Breastfeeding How  should I use this medication? This medication is injected into a vein. It is given by your care team in a hospital or clinic setting. Talk to your care team about the use of this medication in children. Special care may be needed. Overdosage: If you think you have taken too much of this medicine contact a poison control center or emergency room at once. NOTE: This medicine is only for you. Do not share this medicine with others. What if I miss a dose? Keep appointments for follow-up doses. It is important not to miss your dose. Call your care team if you are unable to keep an appointment. What may interact with this medication? Interactions have not been studied. This list may not describe all possible interactions. Give your health care provider a list of all the medicines, herbs, non-prescription drugs, or dietary supplements you use. Also tell them if you smoke, drink alcohol, or use illegal drugs. Some items may interact with your medicine. What should I watch for while using this medication? Your condition will be monitored carefully while you are receiving this medication. This medication can cause serious allergic reactions. To reduce your risk, your care team may give you other medication to take before receiving this one. Be sure to follow the directions from your care team. This medication can affect the results of blood tests to match your blood type. These changes can last for up to 6 months after the final dose. Your care team will do blood tests to match your blood type before you start treatment. Tell all of your care team that you   are being treated with this medication before receiving a blood transfusion. This medication can affect the results of some tests used to determine treatment response; extra tests may be needed to evaluate response. Talk to your care team if you wish to become pregnant or think you are pregnant. This medication can cause serious birth defects if taken during  pregnancy and for 3 months after the last dose. A reliable form of contraception is recommended while taking this medication and for 3 months after the last dose. Talk to your care team about effective forms of contraception. Do not breast-feed while taking this medication. What side effects may I notice from receiving this medication? Side effects that you should report to your care team as soon as possible: Allergic reactions--skin rash, itching, hives, swelling of the face, lips, tongue, or throat Infection--fever, chills, cough, sore throat, wounds that don't heal, pain or trouble when passing urine, general feeling of discomfort or being unwell Infusion reactions--chest pain, shortness of breath or trouble breathing, feeling faint or lightheaded Unusual bruising or bleeding Side effects that usually do not require medical attention (report to your care team if they continue or are bothersome): Constipation Diarrhea Fatigue Nausea Pain, tingling, or numbness in the hands or feet Swelling of the ankles, hands, or feet This list may not describe all possible side effects. Call your doctor for medical advice about side effects. You may report side effects to FDA at 1-800-FDA-1088. Where should I keep my medication? This medication is given in a hospital or clinic. It will not be stored at home. NOTE: This sheet is a summary. It may not cover all possible information. If you have questions about this medicine, talk to your doctor, pharmacist, or health care provider.  2023 Elsevier/Gold Standard (2021-08-15 00:00:00)    Bortezomib Injection What is this medication? BORTEZOMIB (bor TEZ oh mib) treats lymphoma. It may also be used to treat multiple myeloma, a type of bone marrow cancer. It works by blocking a protein that causes cancer cells to grow and multiply. This helps to slow or stop the spread of cancer cells. This medicine may be used for other purposes; ask your health care provider  or pharmacist if you have questions. COMMON BRAND NAME(S): Velcade What should I tell my care team before I take this medication? They need to know if you have any of these conditions: Dehydration Diabetes Heart disease Liver disease Tingling of the fingers or toes or other nerve disorder An unusual or allergic reaction to bortezomib, other medications, foods, dyes, or preservatives If you or your partner are pregnant or trying to get pregnant Breastfeeding How should I use this medication? This medication is injected into a vein or under the skin. It is given by your care team in a hospital or clinic setting. Talk to your care team about the use of this medication in children. Special care may be needed. Overdosage: If you think you have taken too much of this medicine contact a poison control center or emergency room at once. NOTE: This medicine is only for you. Do not share this medicine with others. What if I miss a dose? Keep appointments for follow-up doses. It is important not to miss your dose. Call your care team if you are unable to keep an appointment. What may interact with this medication? Ketoconazole Rifampin This list may not describe all possible interactions. Give your health care provider a list of all the medicines, herbs, non-prescription drugs, or dietary supplements you use.   Also tell them if you smoke, drink alcohol, or use illegal drugs. Some items may interact with your medicine. What should I watch for while using this medication? Your condition will be monitored carefully while you are receiving this medication. You may need blood work while taking this medication. This medication may affect your coordination, reaction time, or judgment. Do not drive or operate machinery until you know how this medication affects you. Sit up or stand slowly to reduce the risk of dizzy or fainting spells. Drinking alcohol with this medication can increase the risk of these side  effects. This medication may increase your risk of getting an infection. Call your care team for advice if you get a fever, chills, sore throat, or other symptoms of a cold or flu. Do not treat yourself. Try to avoid being around people who are sick. Check with your care team if you have severe diarrhea, nausea, and vomiting, or if you sweat a lot. The loss of too much body fluid may make it dangerous for you to take this medication. Talk to your care team if you may be pregnant. Serious birth defects can occur if you take this medication during pregnancy and for 7 months after the last dose. You will need a negative pregnancy test before starting this medication. Contraception is recommended while taking this medication and for 7 months after the last dose. Your care team can help you find the option that works for you. If your partner can get pregnant, use a condom during sex while taking this medication and for 4 months after the last dose. Do not breastfeed while taking this medication and for 2 months after the last dose. This medication may cause infertility. Talk to your care team if you are concerned about your fertility. What side effects may I notice from receiving this medication? Side effects that you should report to your care team as soon as possible: Allergic reactions--skin rash, itching, hives, swelling of the face, lips, tongue, or throat Bleeding--bloody or black, tar-like stools, vomiting blood or Yolonda Purtle material that looks like coffee grounds, red or dark Yuka Lallier urine, small red or purple spots on skin, unusual bruising or bleeding Bleeding in the brain--severe headache, stiff neck, confusion, dizziness, change in vision, numbness or weakness of the face, arm, or leg, trouble speaking, trouble walking, vomiting Bowel blockage--stomach cramping, unable to have a bowel movement or pass gas, loss of appetite, vomiting Heart failure--shortness of breath, swelling of the ankles, feet, or  hands, sudden weight gain, unusual weakness or fatigue Infection--fever, chills, cough, sore throat, wounds that don't heal, pain or trouble when passing urine, general feeling of discomfort or being unwell Liver injury--right upper belly pain, loss of appetite, nausea, light-colored stool, dark yellow or Esteven Overfelt urine, yellowing skin or eyes, unusual weakness or fatigue Low blood pressure--dizziness, feeling faint or lightheaded, blurry vision Lung injury--shortness of breath or trouble breathing, cough, spitting up blood, chest pain, fever Pain, tingling, or numbness in the hands or feet Severe or prolonged diarrhea Stomach pain, bloody diarrhea, pale skin, unusual weakness or fatigue, decrease in the amount of urine, which may be signs of hemolytic uremic syndrome Sudden and severe headache, confusion, change in vision, seizures, which may be signs of posterior reversible encephalopathy syndrome (PRES) TTP--purple spots on the skin or inside the mouth, pale skin, yellowing skin or eyes, unusual weakness or fatigue, fever, fast or irregular heartbeat, confusion, change in vision, trouble speaking, trouble walking Tumor lysis syndrome (TLS)--nausea, vomiting, diarrhea, decrease in   the amount of urine, dark urine, unusual weakness or fatigue, confusion, muscle pain or cramps, fast or irregular heartbeat, joint pain Side effects that usually do not require medical attention (report to your care team if they continue or are bothersome): Constipation Diarrhea Fatigue Loss of appetite Nausea This list may not describe all possible side effects. Call your doctor for medical advice about side effects. You may report side effects to FDA at 1-800-FDA-1088. Where should I keep my medication? This medication is given in a hospital or clinic. It will not be stored at home. NOTE: This sheet is a summary. It may not cover all possible information. If you have questions about this medicine, talk to your doctor,  pharmacist, or health care provider.  2023 Elsevier/Gold Standard (2021-09-19 00:00:00)        To help prevent nausea and vomiting after your treatment, we encourage you to take your nausea medication as directed.  BELOW ARE SYMPTOMS THAT SHOULD BE REPORTED IMMEDIATELY: *FEVER GREATER THAN 100.4 F (38 C) OR HIGHER *CHILLS OR SWEATING *NAUSEA AND VOMITING THAT IS NOT CONTROLLED WITH YOUR NAUSEA MEDICATION *UNUSUAL SHORTNESS OF BREATH *UNUSUAL BRUISING OR BLEEDING *URINARY PROBLEMS (pain or burning when urinating, or frequent urination) *BOWEL PROBLEMS (unusual diarrhea, constipation, pain near the anus) TENDERNESS IN MOUTH AND THROAT WITH OR WITHOUT PRESENCE OF ULCERS (sore throat, sores in mouth, or a toothache) UNUSUAL RASH, SWELLING OR PAIN  UNUSUAL VAGINAL DISCHARGE OR ITCHING   Items with * indicate a potential emergency and should be followed up as soon as possible or go to the Emergency Department if any problems should occur.  Please show the CHEMOTHERAPY ALERT CARD or IMMUNOTHERAPY ALERT CARD at check-in to the Emergency Department and triage nurse.  Should you have questions after your visit or need to cancel or reschedule your appointment, please contact [facility] [phone]  and follow the prompts.  Office hours are 8:00 a.m. to 4:30 p.m. Monday - Friday. Please note that voicemails left after 4:00 p.m. may not be returned until the following business day.  We are closed weekends and major holidays. You have access to a nurse at all times for urgent questions. Please call the main number to the clinic [phone] and follow the prompts.  For any non-urgent questions, you may also contact your provider using MyChart. We now offer e-Visits for anyone 18 and older to request care online for non-urgent symptoms. For details visit [email or web address].   Also download the MyChart app! Go to the app store, search "MyChart", open the app, select [facility], and log in with your MyChart username and password.   

## 2022-08-17 ENCOUNTER — Other Ambulatory Visit: Payer: Self-pay | Admitting: Cardiovascular Disease

## 2022-08-17 ENCOUNTER — Other Ambulatory Visit: Payer: Self-pay | Admitting: Hematology

## 2022-08-17 DIAGNOSIS — C9000 Multiple myeloma not having achieved remission: Secondary | ICD-10-CM

## 2022-08-19 ENCOUNTER — Other Ambulatory Visit: Payer: Self-pay

## 2022-08-19 ENCOUNTER — Encounter: Payer: Self-pay | Admitting: Hematology

## 2022-08-21 ENCOUNTER — Inpatient Hospital Stay: Payer: Medicare Other | Admitting: Hematology

## 2022-08-21 ENCOUNTER — Inpatient Hospital Stay: Payer: Medicare Other

## 2022-08-21 VITALS — BP 169/67 | HR 58 | Temp 97.8°F | Resp 18

## 2022-08-21 DIAGNOSIS — D539 Nutritional anemia, unspecified: Secondary | ICD-10-CM | POA: Diagnosis not present

## 2022-08-21 DIAGNOSIS — D6481 Anemia due to antineoplastic chemotherapy: Secondary | ICD-10-CM | POA: Diagnosis not present

## 2022-08-21 DIAGNOSIS — C9000 Multiple myeloma not having achieved remission: Secondary | ICD-10-CM

## 2022-08-21 DIAGNOSIS — R300 Dysuria: Secondary | ICD-10-CM | POA: Diagnosis not present

## 2022-08-21 DIAGNOSIS — Z87891 Personal history of nicotine dependence: Secondary | ICD-10-CM | POA: Diagnosis not present

## 2022-08-21 DIAGNOSIS — Z5112 Encounter for antineoplastic immunotherapy: Secondary | ICD-10-CM | POA: Diagnosis not present

## 2022-08-21 LAB — CBC WITH DIFFERENTIAL/PLATELET
Abs Immature Granulocytes: 0.02 10*3/uL (ref 0.00–0.07)
Basophils Absolute: 0 10*3/uL (ref 0.0–0.1)
Basophils Relative: 0 %
Eosinophils Absolute: 0.1 10*3/uL (ref 0.0–0.5)
Eosinophils Relative: 2 %
HCT: 27.1 % — ABNORMAL LOW (ref 39.0–52.0)
Hemoglobin: 8.9 g/dL — ABNORMAL LOW (ref 13.0–17.0)
Immature Granulocytes: 0 %
Lymphocytes Relative: 26 %
Lymphs Abs: 1.2 10*3/uL (ref 0.7–4.0)
MCH: 34.8 pg — ABNORMAL HIGH (ref 26.0–34.0)
MCHC: 32.8 g/dL (ref 30.0–36.0)
MCV: 105.9 fL — ABNORMAL HIGH (ref 80.0–100.0)
Monocytes Absolute: 0.7 10*3/uL (ref 0.1–1.0)
Monocytes Relative: 16 %
Neutro Abs: 2.6 10*3/uL (ref 1.7–7.7)
Neutrophils Relative %: 56 %
Platelets: 79 10*3/uL — ABNORMAL LOW (ref 150–400)
RBC: 2.56 MIL/uL — ABNORMAL LOW (ref 4.22–5.81)
WBC: 4.6 10*3/uL (ref 4.0–10.5)
nRBC: 0 % (ref 0.0–0.2)

## 2022-08-21 LAB — COMPREHENSIVE METABOLIC PANEL
ALT: 33 U/L (ref 0–44)
AST: 21 U/L (ref 15–41)
Albumin: 2.7 g/dL — ABNORMAL LOW (ref 3.5–5.0)
Alkaline Phosphatase: 52 U/L (ref 38–126)
Anion gap: 5 (ref 5–15)
BUN: 21 mg/dL (ref 8–23)
CO2: 23 mmol/L (ref 22–32)
Calcium: 8.5 mg/dL — ABNORMAL LOW (ref 8.9–10.3)
Chloride: 101 mmol/L (ref 98–111)
Creatinine, Ser: 0.8 mg/dL (ref 0.61–1.24)
GFR, Estimated: >60 mL/min (ref >60)
Glucose, Bld: 87 mg/dL (ref 70–99)
Potassium: 3.8 mmol/L (ref 3.5–5.1)
Sodium: 129 mmol/L — ABNORMAL LOW (ref 135–145)
Total Bilirubin: 0.7 mg/dL (ref 0.3–1.2)
Total Protein: 8.3 g/dL — ABNORMAL HIGH (ref 6.5–8.1)

## 2022-08-21 LAB — SAMPLE TO BLOOD BANK

## 2022-08-21 LAB — MAGNESIUM: Magnesium: 2.1 mg/dL (ref 1.7–2.4)

## 2022-08-21 LAB — PHOSPHORUS: Phosphorus: 2.9 mg/dL (ref 2.5–4.6)

## 2022-08-21 MED ORDER — ACETAMINOPHEN 325 MG PO TABS
650.0000 mg | ORAL_TABLET | Freq: Once | ORAL | Status: AC
  Administered 2022-08-21: 650 mg via ORAL
  Filled 2022-08-21: qty 2

## 2022-08-21 MED ORDER — SODIUM CHLORIDE 0.9 % IV SOLN: Freq: Once | INTRAVENOUS | Status: AC

## 2022-08-21 MED ORDER — ONDANSETRON HCL 4 MG/2ML IJ SOLN
8.0000 mg | Freq: Once | INTRAMUSCULAR | Status: AC
  Administered 2022-08-21: 8 mg via INTRAVENOUS
  Filled 2022-08-21: qty 4

## 2022-08-21 MED ORDER — SODIUM CHLORIDE 0.9% FLUSH
10.0000 mL | INTRAVENOUS | Status: DC | PRN
  Administered 2022-08-21: 10 mL

## 2022-08-21 MED ORDER — HEPARIN SOD (PORK) LOCK FLUSH 100 UNIT/ML IV SOLN
500.0000 [IU] | Freq: Once | INTRAVENOUS | Status: AC | PRN
  Administered 2022-08-21: 500 [IU]

## 2022-08-21 MED ORDER — METHYLPREDNISOLONE SODIUM SUCC 125 MG IJ SOLR
100.0000 mg | Freq: Once | INTRAMUSCULAR | Status: AC
  Administered 2022-08-21: 100 mg via INTRAVENOUS
  Filled 2022-08-21: qty 2

## 2022-08-21 MED ORDER — SODIUM CHLORIDE 0.9 % IV SOLN
16.0000 mg/kg | Freq: Once | INTRAVENOUS | Status: AC
  Administered 2022-08-21: 900 mg via INTRAVENOUS
  Filled 2022-08-21: qty 40

## 2022-08-21 MED ORDER — BORTEZOMIB CHEMO SQ INJECTION 3.5 MG (2.5MG/ML)
1.0000 mg/m2 | Freq: Once | INTRAMUSCULAR | Status: AC
  Administered 2022-08-21: 1.75 mg via SUBCUTANEOUS
  Filled 2022-08-21: qty 0.7

## 2022-08-21 MED ORDER — CETIRIZINE HCL 10 MG/ML IV SOLN
10.0000 mg | Freq: Once | INTRAVENOUS | Status: AC
  Administered 2022-08-21: 10 mg via INTRAVENOUS
  Filled 2022-08-21: qty 1

## 2022-08-21 MED ORDER — SODIUM CHLORIDE 0.9% FLUSH
10.0000 mL | INTRAVENOUS | Status: DC | PRN
  Administered 2022-08-21: 10 mL via INTRAVENOUS

## 2022-08-21 NOTE — Patient Instructions (Signed)
[facility]  Discharge Instructions: Thank you for choosing [facility] to provide your oncology and hematology care.  If you have a lab appointment with the Cancer Center - please note that after April 8th, 2024, all labs will be drawn in the cancer center.  You do not have to check in or register with the main entrance as you have in the past but will complete your check-in in the cancer center.  Wear comfortable clothing and clothing appropriate for easy access to any Portacath or PICC line.   We strive to give you quality time with your provider. You may need to reschedule your appointment if you arrive late (15 or more minutes).  Arriving late affects you and other patients whose appointments are after yours.  Also, if you miss three or more appointments without notifying the office, you may be dismissed from the clinic at the provider's discretion.      For prescription refill requests, have your pharmacy contact our office and allow 72 hours for refills to be completed.    Today you received the following chemotherapy and/or immunotherapy agents Dara IV and Velcade   To help prevent nausea and vomiting after your treatment, we encourage you to take your nausea medication as directed.  Daratumumab Injection What is this medication? DARATUMUMAB (dar a toom ue mab) treats multiple myeloma, a type of bone marrow cancer. It works by helping your immune system slow or stop the spread of cancer cells. It is a monoclonal antibody. This medicine may be used for other purposes; ask your health care provider or pharmacist if you have questions. COMMON BRAND NAME(S): DARZALEX What should I tell my care team before I take this medication? They need to know if you have any of these conditions: Hereditary fructose intolerance Infection, such as chickenpox, herpes, hepatitis B Lung or breathing disease, such as asthma, COPD An unusual or allergic reaction to  daratumumab, sorbitol, other medications, foods, dyes, or preservatives Pregnant or trying to get pregnant Breastfeeding How should I use this medication? This medication is injected into a vein. It is given by your care team in a hospital or clinic setting. Talk to your care team about the use of this medication in children. Special care may be needed. Overdosage: If you think you have taken too much of this medicine contact a poison control center or emergency room at once. NOTE: This medicine is only for you. Do not share this medicine with others. What if I miss a dose? Keep appointments for follow-up doses. It is important not to miss your dose. Call your care team if you are unable to keep an appointment. What may interact with this medication? Interactions have not been studied. This list may not describe all possible interactions. Give your health care provider a list of all the medicines, herbs, non-prescription drugs, or dietary supplements you use. Also tell them if you smoke, drink alcohol, or use illegal drugs. Some items may interact with your medicine. What should I watch for while using this medication? Your condition will be monitored carefully while you are receiving this medication. This medication can cause serious allergic reactions. To reduce your risk, your care team may give you other medication to take before receiving this one. Be sure to follow the directions from your care team. This medication can affect the results of blood tests to match your blood type. These changes can last for up to 6 months after the final dose.  Your care team will do blood tests to match your blood type before you start treatment. Tell all of your care team that you are being treated with this medication before receiving a blood transfusion. This medication can affect the results of some tests used to determine treatment response; extra tests may be needed to evaluate response. Talk to your care  team if you wish to become pregnant or think you are pregnant. This medication can cause serious birth defects if taken during pregnancy and for 3 months after the last dose. A reliable form of contraception is recommended while taking this medication and for 3 months after the last dose. Talk to your care team about effective forms of contraception. Do not breast-feed while taking this medication. What side effects may I notice from receiving this medication? Side effects that you should report to your care team as soon as possible: Allergic reactions--skin rash, itching, hives, swelling of the face, lips, tongue, or throat Infection--fever, chills, cough, sore throat, wounds that don't heal, pain or trouble when passing urine, general feeling of discomfort or being unwell Infusion reactions--chest pain, shortness of breath or trouble breathing, feeling faint or lightheaded Unusual bruising or bleeding Side effects that usually do not require medical attention (report to your care team if they continue or are bothersome): Constipation Diarrhea Fatigue Nausea Pain, tingling, or numbness in the hands or feet Swelling of the ankles, hands, or feet This list may not describe all possible side effects. Call your doctor for medical advice about side effects. You may report side effects to FDA at 1-800-FDA-1088. Where should I keep my medication? This medication is given in a hospital or clinic. It will not be stored at home. NOTE: This sheet is a summary. It may not cover all possible information. If you have questions about this medicine, talk to your doctor, pharmacist, or health care provider.  2023 Elsevier/Gold Standard (2021-08-15 00:00:00)  Bortezomib Injection What is this medication? BORTEZOMIB (bor TEZ oh mib) treats lymphoma. It may also be used to treat multiple myeloma, a type of bone marrow cancer. It works by blocking a protein that causes cancer cells to grow and multiply. This  helps to slow or stop the spread of cancer cells. This medicine may be used for other purposes; ask your health care provider or pharmacist if you have questions. COMMON BRAND NAME(S): Velcade What should I tell my care team before I take this medication? They need to know if you have any of these conditions: Dehydration Diabetes Heart disease Liver disease Tingling of the fingers or toes or other nerve disorder An unusual or allergic reaction to bortezomib, other medications, foods, dyes, or preservatives If you or your partner are pregnant or trying to get pregnant Breastfeeding How should I use this medication? This medication is injected into a vein or under the skin. It is given by your care team in a hospital or clinic setting. Talk to your care team about the use of this medication in children. Special care may be needed. Overdosage: If you think you have taken too much of this medicine contact a poison control center or emergency room at once. NOTE: This medicine is only for you. Do not share this medicine with others. What if I miss a dose? Keep appointments for follow-up doses. It is important not to miss your dose. Call your care team if you are unable to keep an appointment. What may interact with this medication? Ketoconazole Rifampin This list may not describe  all possible interactions. Give your health care provider a list of all the medicines, herbs, non-prescription drugs, or dietary supplements you use. Also tell them if you smoke, drink alcohol, or use illegal drugs. Some items may interact with your medicine. What should I watch for while using this medication? Your condition will be monitored carefully while you are receiving this medication. You may need blood work while taking this medication. This medication may affect your coordination, reaction time, or judgment. Do not drive or operate machinery until you know how this medication affects you. Sit up or stand  slowly to reduce the risk of dizzy or fainting spells. Drinking alcohol with this medication can increase the risk of these side effects. This medication may increase your risk of getting an infection. Call your care team for advice if you get a fever, chills, sore throat, or other symptoms of a cold or flu. Do not treat yourself. Try to avoid being around people who are sick. Check with your care team if you have severe diarrhea, nausea, and vomiting, or if you sweat a lot. The loss of too much body fluid may make it dangerous for you to take this medication. Talk to your care team if you may be pregnant. Serious birth defects can occur if you take this medication during pregnancy and for 7 months after the last dose. You will need a negative pregnancy test before starting this medication. Contraception is recommended while taking this medication and for 7 months after the last dose. Your care team can help you find the option that works for you. If your partner can get pregnant, use a condom during sex while taking this medication and for 4 months after the last dose. Do not breastfeed while taking this medication and for 2 months after the last dose. This medication may cause infertility. Talk to your care team if you are concerned about your fertility. What side effects may I notice from receiving this medication? Side effects that you should report to your care team as soon as possible: Allergic reactions--skin rash, itching, hives, swelling of the face, lips, tongue, or throat Bleeding--bloody or black, tar-like stools, vomiting blood or brown material that looks like coffee grounds, red or dark brown urine, small red or purple spots on skin, unusual bruising or bleeding Bleeding in the brain--severe headache, stiff neck, confusion, dizziness, change in vision, numbness or weakness of the face, arm, or leg, trouble speaking, trouble walking, vomiting Bowel blockage--stomach cramping, unable to have  a bowel movement or pass gas, loss of appetite, vomiting Heart failure--shortness of breath, swelling of the ankles, feet, or hands, sudden weight gain, unusual weakness or fatigue Infection--fever, chills, cough, sore throat, wounds that don't heal, pain or trouble when passing urine, general feeling of discomfort or being unwell Liver injury--right upper belly pain, loss of appetite, nausea, light-colored stool, dark yellow or brown urine, yellowing skin or eyes, unusual weakness or fatigue Low blood pressure--dizziness, feeling faint or lightheaded, blurry vision Lung injury--shortness of breath or trouble breathing, cough, spitting up blood, chest pain, fever Pain, tingling, or numbness in the hands or feet Severe or prolonged diarrhea Stomach pain, bloody diarrhea, pale skin, unusual weakness or fatigue, decrease in the amount of urine, which may be signs of hemolytic uremic syndrome Sudden and severe headache, confusion, change in vision, seizures, which may be signs of posterior reversible encephalopathy syndrome (PRES) TTP--purple spots on the skin or inside the mouth, pale skin, yellowing skin or eyes, unusual weakness or  fatigue, fever, fast or irregular heartbeat, confusion, change in vision, trouble speaking, trouble walking Tumor lysis syndrome (TLS)--nausea, vomiting, diarrhea, decrease in the amount of urine, dark urine, unusual weakness or fatigue, confusion, muscle pain or cramps, fast or irregular heartbeat, joint pain Side effects that usually do not require medical attention (report to your care team if they continue or are bothersome): Constipation Diarrhea Fatigue Loss of appetite Nausea This list may not describe all possible side effects. Call your doctor for medical advice about side effects. You may report side effects to FDA at 1-800-FDA-1088. Where should I keep my medication? This medication is given in a hospital or clinic. It will not be stored at home. NOTE: This  sheet is a summary. It may not cover all possible information. If you have questions about this medicine, talk to your doctor, pharmacist, or health care provider.  2023 Elsevier/Gold Standard (2021-09-19 00:00:00)    BELOW ARE SYMPTOMS THAT SHOULD BE REPORTED IMMEDIATELY: *FEVER GREATER THAN 100.4 F (38 C) OR HIGHER *CHILLS OR SWEATING *NAUSEA AND VOMITING THAT IS NOT CONTROLLED WITH YOUR NAUSEA MEDICATION *UNUSUAL SHORTNESS OF BREATH *UNUSUAL BRUISING OR BLEEDING *URINARY PROBLEMS (pain or burning when urinating, or frequent urination) *BOWEL PROBLEMS (unusual diarrhea, constipation, pain near the anus) TENDERNESS IN MOUTH AND THROAT WITH OR WITHOUT PRESENCE OF ULCERS (sore throat, sores in mouth, or a toothache) UNUSUAL RASH, SWELLING OR PAIN  UNUSUAL VAGINAL DISCHARGE OR ITCHING   Items with * indicate a potential emergency and should be followed up as soon as possible or go to the Emergency Department if any problems should occur.  Please show the CHEMOTHERAPY ALERT CARD or IMMUNOTHERAPY ALERT CARD at check-in to the Emergency Department and triage nurse.  Should you have questions after your visit or need to cancel or reschedule your appointment, please contact [facility] [phone]  and follow the prompts.  Office hours are 8:00 a.m. to 4:30 p.m. Monday - Friday. Please note that voicemails left after 4:00 p.m. may not be returned until the following business day.  We are closed weekends and major holidays. You have access to a nurse at all times for urgent questions. Please call the main number to the clinic [phone] and follow the prompts.  For any non-urgent questions, you may also contact your provider using MyChart. We now offer e-Visits for anyone 52 and older to request care online for non-urgent symptoms. For details visit [email or web address].   Also download the MyChart app! Go to the app store, search "MyChart", open the app, select [facility], and log in with your MyChart username and password.

## 2022-08-21 NOTE — Progress Notes (Signed)
Patient presents today for Dara IV and Velcade infusion.  Patient is in satisfactory condition with no new complaints voiced.  Vital signs are stable.  Labs reviewed and all other labs are within treatment parameters. Pt's platelets are 79, Dr.K made aware and stated pt is okay to proceed with treatment today.  We will proceed with treatment per MD orders.    Dara IV and Velcade given today per MD orders. Tolerated infusion without adverse affects. Vital signs stable. No complaints at this time. Discharged from clinic ambulatory in stable condition. Alert and oriented x 3. F/U with [facility] as scheduled.

## 2022-08-22 ENCOUNTER — Inpatient Hospital Stay: Payer: Medicare Other

## 2022-08-23 ENCOUNTER — Other Ambulatory Visit: Payer: Self-pay | Admitting: Cardiovascular Disease

## 2022-08-26 ENCOUNTER — Other Ambulatory Visit: Payer: Self-pay | Admitting: Hematology

## 2022-08-26 DIAGNOSIS — C9000 Multiple myeloma not having achieved remission: Secondary | ICD-10-CM

## 2022-08-27 ENCOUNTER — Inpatient Hospital Stay: Payer: Medicare Other

## 2022-08-27 ENCOUNTER — Telehealth: Payer: Self-pay

## 2022-08-27 VITALS — BP 162/64 | HR 57 | Temp 96.4°F | Resp 18

## 2022-08-27 DIAGNOSIS — D6481 Anemia due to antineoplastic chemotherapy: Secondary | ICD-10-CM | POA: Diagnosis not present

## 2022-08-27 DIAGNOSIS — Z95828 Presence of other vascular implants and grafts: Secondary | ICD-10-CM

## 2022-08-27 DIAGNOSIS — Z87891 Personal history of nicotine dependence: Secondary | ICD-10-CM | POA: Diagnosis not present

## 2022-08-27 DIAGNOSIS — Z5112 Encounter for antineoplastic immunotherapy: Secondary | ICD-10-CM | POA: Diagnosis not present

## 2022-08-27 DIAGNOSIS — C9000 Multiple myeloma not having achieved remission: Secondary | ICD-10-CM

## 2022-08-27 DIAGNOSIS — R300 Dysuria: Secondary | ICD-10-CM | POA: Diagnosis not present

## 2022-08-27 DIAGNOSIS — D539 Nutritional anemia, unspecified: Secondary | ICD-10-CM | POA: Diagnosis not present

## 2022-08-27 LAB — CBC
HCT: 24.6 % — ABNORMAL LOW (ref 39.0–52.0)
Hemoglobin: 8.2 g/dL — ABNORMAL LOW (ref 13.0–17.0)
MCH: 36.1 pg — ABNORMAL HIGH (ref 26.0–34.0)
MCHC: 33.3 g/dL (ref 30.0–36.0)
MCV: 108.4 fL — ABNORMAL HIGH (ref 80.0–100.0)
Platelets: 47 10*3/uL — ABNORMAL LOW (ref 150–400)
RBC: 2.27 MIL/uL — ABNORMAL LOW (ref 4.22–5.81)
WBC: 4.8 10*3/uL (ref 4.0–10.5)
nRBC: 0 % (ref 0.0–0.2)

## 2022-08-27 LAB — SAMPLE TO BLOOD BANK

## 2022-08-27 MED ORDER — SODIUM CHLORIDE 0.9% FLUSH
10.0000 mL | Freq: Once | INTRAVENOUS | Status: AC
  Administered 2022-08-27: 10 mL via INTRAVENOUS

## 2022-08-27 MED ORDER — HEPARIN SOD (PORK) LOCK FLUSH 100 UNIT/ML IV SOLN
500.0000 [IU] | Freq: Once | INTRAVENOUS | Status: AC
  Administered 2022-08-27: 500 [IU] via INTRAVENOUS

## 2022-08-27 NOTE — Progress Notes (Signed)
Port flushed with good blood return noted. No bruising or swelling at site. Bandaid applied and patient discharged in satisfactory condition. VVS stable with no signs or symptoms of distressed noted. 

## 2022-08-27 NOTE — Telephone Encounter (Signed)
Called patient and left message that he did not need to come for blood tomorrow. His hemoglobin is 8.2.

## 2022-08-28 ENCOUNTER — Inpatient Hospital Stay: Payer: Medicare Other

## 2022-08-28 LAB — KAPPA/LAMBDA LIGHT CHAINS
Kappa free light chain: 3 mg/L — ABNORMAL LOW (ref 3.3–19.4)
Kappa, lambda light chain ratio: 0.01 — ABNORMAL LOW (ref 0.26–1.65)
Lambda free light chains: 439 mg/L — ABNORMAL HIGH (ref 5.7–26.3)

## 2022-08-30 LAB — PROTEIN ELECTROPHORESIS, SERUM
A/G Ratio: 0.7 (ref 0.7–1.7)
Albumin ELP: 3.1 g/dL (ref 2.9–4.4)
Alpha-1-Globulin: 0.2 g/dL (ref 0.0–0.4)
Alpha-2-Globulin: 0.6 g/dL (ref 0.4–1.0)
Beta Globulin: 0.6 g/dL — ABNORMAL LOW (ref 0.7–1.3)
Gamma Globulin: 2.9 g/dL — ABNORMAL HIGH (ref 0.4–1.8)
Globulin, Total: 4.3 g/dL — ABNORMAL HIGH (ref 2.2–3.9)
M-Spike, %: 2.6 g/dL — ABNORMAL HIGH
Total Protein ELP: 7.4 g/dL (ref 6.0–8.5)

## 2022-09-02 ENCOUNTER — Encounter: Payer: Self-pay | Admitting: Hematology

## 2022-09-03 NOTE — Progress Notes (Signed)
[facility] [address], [place] [address]    Clinic Day:  09/04/2022  Referring physician: Doreatha Massed, MD  Patient Care Team: Pcp, No as PCP - General Lennette Bihari, MD as PCP - Cardiology (Cardiology) Lennette Bihari, MD as Consulting Physician (Cardiology) Jena Gauss Gerrit Friends, MD as Consulting Physician (Gastroenterology) Doreatha Massed, MD as Medical Oncologist (Oncology)   ASSESSMENT & PLAN:   Assessment: 1.  IgG lambda plasma cell myeloma: - Work-up for macrocytic anemia on 12/01/2020 showed M spike of 2.9 g.  Immunofixation IgG lambda. - Lambda light chains elevated at 284.  Light chain ratio was 0.04.  LDH was 163.  Creatinine was 0.9 and calcium 8.9. - Bone marrow biopsy on 12/18/2020-hypercellular marrow for age with 48% atypical plasma cells with lambda light chain restriction. - Chromosome analysis was normal. 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 - Revlimid 5 mg, 2 weeks on/1 week off started on 01/20/2021.  Dexamethasone weekly 10 mg added on 02/07/2021.  Revlimid is discontinued around 06/15/2021 due to poor tolerance and ineffectiveness at low-dose.  Thrombocytopenia and anemia. - Velcade weekly on days 1, 8, 15 every 21 days along with dexamethasone 10 mg weekly started on 06/25/2021. 2.  Macrocytic anemia: - CBC on 12/01/2020 with hemoglobin 8.5 and MCV of 117. - Denies any bleeding per rectum or melena.   3.  Social/family history: - Lives at home with his wife.  He does all ADLs and IADLs.  He even does yard work. - He worked at [employer] for 35 years.  Denies any chemical exposure.  Non-smoker. - No family history of malignancies.     Plan: 1.  IgG lambda plasma cell myeloma, high risk: - He has completed 6 cycles of treatment with Velcade and Darzalex. - He is tolerating it well.  We reviewed myeloma labs from 08/27/2022 with M spike  improved to 2.6 from 2.9.  Lambda light chains also improved 439.  Labs today show mild leukopenia and thrombocytopenia. - Proceed with cycle 7-day 1 today.  We will continue Velcade and Darzalex 3 weeks on/1 week off.  RTC 4 weeks for follow-up with repeat myeloma labs.  2.  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  We will check his CBC weekly to keep hemoglobin above 8.  3.  Hypophosphatemia: - We discontinued his phosphate due to constipation alternating with diarrhea.  Phosphate level today is normal at 3.1.  4.  Right ankle swelling: - Continue Lasix daily as needed.  5.  ID prophylaxis: - Continue acyclovir twice daily.  Continue aspirin 81 mg 3 times weekly.  Orders Placed This Encounter  Procedures   Comprehensive metabolic panel    Standing Status:   Future    Standing Expiration Date:   11/27/2023   Magnesium    Standing Status:   Future    Standing Expiration Date:   11/27/2023   CBC with Differential    Standing Status:   Future    Standing Expiration Date:   11/27/2023   Comprehensive metabolic panel    Standing Status:   Future    Standing Expiration Date:   09/11/2023   Magnesium    Standing Status:   Future    Standing Expiration Date:   09/11/2023   CBC with Differential    Standing Status:   Future    Standing Expiration Date:  09/11/2023   Comprehensive metabolic panel    Standing Status:   Future    Standing Expiration Date:   09/18/2023   Magnesium    Standing Status:   Future    Standing Expiration Date:   09/18/2023   CBC with Differential    Standing Status:   Future    Standing Expiration Date:   09/18/2023   Protein electrophoresis, serum    Standing Status:   Future    Standing Expiration Date:   09/04/2023   Kappa/lambda light chains    Standing Status:   Future    Standing Expiration Date:   09/04/2023   CBC    Standing Status:   Future    Standing Expiration Date:   09/04/2023      I,Katie Daubenspeck,acting as a  scribe for Doreatha Massed, MD.,have documented all relevant documentation on the behalf of Doreatha Massed, MD,as directed by  Doreatha Massed, MD while in the presence of Doreatha Massed, MD.   I, Doreatha Massed MD, have reviewed the above documentation for accuracy and completeness, and I agree with the above.   Doreatha Massed, MD   5/1/20246:03 PM  CHIEF COMPLAINT:   Diagnosis: multiple myeloma and anemia    Cancer Staging  No matching staging information was found for the patient.   Prior Therapy: none  Current Therapy:  Weekly Velcade and daratumumab    HISTORY OF PRESENT ILLNESS:   Oncology History  Multiple myeloma (HCC)  01/15/2021 Initial Diagnosis   Multiple myeloma (HCC)   06/25/2021 - 12/06/2021 Chemotherapy   Patient is on Treatment Plan : MYELOMA NON-TRANSPLANT CANDIDATES VRd weekly q21d     12/20/2021 -  Chemotherapy   Patient is on Treatment Plan : MYELOMA Daratumumab IV q28d        INTERVAL HISTORY:   Dale [name] is a 89 y.o. male presenting to clinic today for follow up of multiple myeloma and anemia. He was last seen by me on 08/07/22.  Today, he states that he is doing well overall. His appetite level is at 90%. His energy level is at 70%.  PAST MEDICAL HISTORY:   Past Medical History: Past Medical History:  Diagnosis Date   Allergic rhinitis    Anal fissure    Anginal pain (HCC)    Asthma    Cancer (HCC)    skin   Chest pain    CHF (congestive heart failure) (HCC)    Coronary heart disease    s/p stenting. cath in 01/2012 noncritical occlusion   Dyspnea    Dysrhythmia    1st degree heart block   GERD (gastroesophageal reflux disease)    Glaucoma    Hiatal hernia    Hyperlipidemia    Hypertension    Hypothyroidism    Idiopathic thrombocytopenic purpura (HCC) 2002   Macular degeneration    Multiple myeloma (HCC) 01/15/2021   Nephrolithiasis    PUD (peptic ulcer disease)    remote   Sarcoidosis    pulmonary    Schatzki's ring     Surgical History: Past Surgical History:  Procedure Laterality Date   cardiac stents     COLONOSCOPY  10/30/2006   Normal rectum, sigmoid diverticula.Remainder of colonic mucosa appeared normal.   CORONARY ANGIOPLASTY WITH STENT PLACEMENT     about 10 years ago per pt (around 2007)   CYSTOSCOPY WITH RETROGRADE PYELOGRAM, URETEROSCOPY AND STENT PLACEMENT Left 06/16/2017   Procedure: CYSTOSCOPY WITH RETROGRADE PYELOGRAM, URETEROSCOPY,STONE EXTRACTION  AND STENT PLACEMENT;  Surgeon: Marcine Matar,  MD;  Location: [facility];  Service: Urology;  Laterality: Left;   ESOPHAGOGASTRODUODENOSCOPY  06/19/2004   Two esophageal rings and esophageal web as described above.  All of these were disrupted by passing 56-French Elease Hashimoto dilator/ Candida esophagitis,which appears to be incidental given history of   antibiotic use, but nevertheless will be treated.   ESOPHAGOGASTRODUODENOSCOPY  10/30/2006   Distal tandem esophageal ring status post dilation disruption as  described above.  Otherwise normal esophagus/  Small hiatal hernia otherwise normal stomach, D1 and D2   ESOPHAGOGASTRODUODENOSCOPY N/A 03/22/2015   Dr.Rourk- noncritical schatzki's ring and hiatal hernia-o/w normal EGD.    ESOPHAGOGASTRODUODENOSCOPY (EGD) WITH ESOPHAGEAL DILATION  03/04/2012   RMR- schatzki's ring, hiatal hernia, polypoid gastric mucosa, bx= minimally active gastritis.   HOLMIUM LASER APPLICATION Left 06/16/2017   Procedure: HOLMIUM LASER APPLICATION;  Surgeon: Marcine Matar, MD;  Location: [facility];  Service: Urology;  Laterality: Left;   IR CV LINE INJECTION  12/27/2021   IR GENERIC HISTORICAL  03/06/2016   IR RADIOLOGIST EVAL & MGMT 03/06/2016 Irish Lack, MD [facility]   IR GENERIC HISTORICAL  06/18/2016   IR RADIOLOGIST EVAL & MGMT 06/18/2016 Irish Lack, MD [facility]   IR IMAGING GUIDED PORT INSERTION  12/18/2021   IR IMAGING GUIDED PORT INSERTION  01/15/2022   IR RADIOLOGIST EVAL &  MGMT  10/01/2016   IR RADIOLOGIST EVAL & MGMT  10/15/2017   IR RADIOLOGIST EVAL & MGMT  12/24/2018   IR RADIOLOGIST EVAL & MGMT  01/04/2020   IR RADIOLOGIST EVAL & MGMT  06/20/2021   IR REMOVAL TUN ACCESS W/ PORT W/O FL MOD SED  01/15/2022   LEFT HEART CATH N/A 02/02/2012   Procedure: LEFT HEART CATH;  Surgeon: Runell Gess, MD;  Location: [facility] CATH LAB;  Service: Cardiovascular;  Laterality: N/A;   MEDIASTINOSCOPY     for dx sarcoid   RADIOLOGY WITH ANESTHESIA Left 05/17/2016   Procedure: left renal ablation;  Surgeon: Irish Lack, MD;  Location: [facility];  Service: Radiology;  Laterality: Left;    Social History: 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    Employer: RETIRED  Tobacco Use   Smoking status: Former    Packs/day: 0.10    Years: 2.00    Additional pack years: 0.00    Total pack years: 0.20    Types: Cigarettes, Cigars    Quit date: 05/06/1970    Years since quitting: 52.3   Smokeless tobacco: Never  Vaping Use   Vaping Use: Never used  Substance and Sexual Activity   Alcohol use: No    Alcohol/week: 0.0 standard drinks of alcohol   Drug use: No   Sexual activity: Never  Other Topics Concern   Not on file  Social History Narrative   Not on file   Social Determinants of Health   Financial Resource Strain: Not on file  Food Insecurity: No Food Insecurity (06/04/2022)   Hunger Vital Sign    Worried About Running Out of Food in the Last Year: Never true    Ran Out of Food in the Last Year: Never true  Transportation Needs: No Transportation Needs (06/04/2022)   PRAPARE - Transportation  Lack of Transportation (Medical): No    Lack of Transportation (Non-Medical): No  Physical Activity: Not on file  Stress: Not on file  Social  Connections: Not on file  Intimate Partner Violence: Not At Risk (06/04/2022)   Humiliation, Afraid, Rape, and Kick questionnaire    Fear of Current or Ex-Partner: No    Emotionally Abused: No    Physically Abused: No    Sexually Abused: No    Family History: Family History  Problem Relation Age of Onset   Heart disease Father        deceased age 70   Stroke Mother    Alzheimer's disease Mother    Heart attack Brother        deceased age 32   Cancer Other        niece   Colon cancer Neg Hx     Current Medications:  Current Outpatient Medications:    acetaminophen (TYLENOL) 325 MG tablet, Take 2 tablets (650 mg total) by mouth every 6 (six) hours as needed for mild pain (or Fever >/= 101)., Disp: , Rfl:    acyclovir (ZOVIRAX) 400 MG tablet, TAKE 1 TABLET BY MOUTH TWICE  DAILY, Disp: 180 tablet, Rfl: 0   Ascorbic Acid (VITAMIN C PO), Take 500 mg by mouth every evening., Disp: , Rfl:    aspirin EC 81 MG tablet, Take 1 tablet (81 mg total) by mouth daily with breakfast. For 30 days Only (Patient taking differently: Take 81 mg by mouth daily with breakfast. Every Monday, Wednesday, and Friday), Disp: 30 tablet, Rfl: 0   atorvastatin (LIPITOR) 40 MG tablet, TAKE 1 TABLET BY MOUTH IN  THE EVENING (Patient taking differently: Take 40 mg by mouth daily.), Disp: 90 tablet, Rfl: 3   Bortezomib (VELCADE IJ), Inject as directed once a week., Disp: , Rfl:    brimonidine (ALPHAGAN) 0.2 % ophthalmic solution, Place 1 drop into the left eye 2 (two) times daily., Disp: , Rfl:    cyanocobalamin 1000 MCG tablet, Take 1 tablet (1,000 mcg total) by mouth daily., Disp: 30 tablet, Rfl: 3   dexamethasone (DECADRON) 2 MG tablet, TAKE 5 TABLETS BY MOUTH ONCE  WEEKLY, Disp: 60 tablet, Rfl: 0   dorzolamide-timolol (COSOPT) 22.3-6.8 MG/ML ophthalmic solution, Place 1 drop into the left eye 2 (two) times daily., Disp: , Rfl:    feeding supplement (ENSURE ENLIVE / ENSURE PLUS) LIQD, Take 237 mLs by mouth 3  (three) times daily between meals., Disp: , Rfl:    furosemide (LASIX) 20 MG tablet, TAKE 2 TABLETS BY MOUTH DAILY, IF YOU NOTICE ANY SWELLING., Disp: 90 tablet, Rfl: 1   isosorbide mononitrate (IMDUR) 60 MG 24 hr tablet, TAKE 1 AND 1/2 TABLETS BY MOUTH  IN THE MORNING AND ONE-HALF  TABLET BY MOUTH AT NIGHT, Disp: 180 tablet, Rfl: 2   levothyroxine (SYNTHROID) 75 MCG tablet, Take 75 mcg by mouth daily before breakfast., Disp: , Rfl:    lidocaine-prilocaine (EMLA) cream, Apply 1 Application topically as needed., Disp: 30 g, Rfl: 1   losartan (COZAAR) 100 MG tablet, TAKE 1 TABLET BY MOUTH DAILY (Patient taking differently: Take 100 mg by mouth daily.), Disp: 90 tablet, Rfl: 3   metoprolol tartrate (LOPRESSOR) 25 MG tablet, TAKE 1 TABLET BY MOUTH IN THE  MORNING AND ONE-HALF TABLET BY  MOUTH IN THE EVENING, Disp: 135 tablet, Rfl: 3   Multiple Vitamins-Minerals (PRESERVISION/LUTEIN) CAPS, Take 1 capsule by mouth 2 (two) times daily., Disp: , Rfl:    nitroGLYCERIN (NITROSTAT)  0.4 MG SL tablet, Place 1 tablet (0.4 mg total) under the tongue every 5 (five) minutes as needed. For chest pain, Disp: 25 tablet, Rfl: 2   pantoprazole (PROTONIX) 40 MG tablet, TAKE 1 TABLET BY MOUTH DAILY  BEFORE BREAKFAST (Patient taking differently: Take 40 mg by mouth daily.), Disp: 90 tablet, Rfl: 3   potassium chloride SA (KLOR-CON M) 20 MEQ tablet, Take 20 mEq by mouth daily., Disp: , Rfl:    prochlorperazine (COMPAZINE) 10 MG tablet, Take 1 tablet (10 mg total) by mouth every 6 (six) hours as needed for nausea or vomiting., Disp: 30 tablet, Rfl: 3   ROCKLATAN 0.02-0.005 % SOLN, Place 1 drop into the left eye at bedtime., Disp: , Rfl:    triamcinolone (KENALOG) 0.1 % cream, Apply 1 application. topically 2 (two) times daily as needed (for irritation)., Disp: , Rfl:    vitamin E 200 UNIT capsule, Take 200 Units by mouth every evening., Disp: , Rfl:  No current facility-administered medications for this  visit.  Facility-Administered Medications Ordered in Other Visits:    acetaminophen (TYLENOL) 325 MG tablet, , , ,    diphenhydrAMINE (BENADRYL) 25 mg capsule, , , ,    montelukast (SINGULAIR) 10 MG tablet, , , ,    sodium chloride flush (NS) 0.9 % injection 10 mL, 10 mL, Intracatheter, PRN, Doreatha Massed, MD, 10 mL at 09/04/22 1314   Allergies: Allergies  Allergen Reactions   Azithromycin Other (See Comments)    Sore mouth and fever blisters around mouth, sores in nose area as well   Doxazosin Shortness Of Breath   Atenolol Other (See Comments)    UNKNOWN REACTION   Hydrocodone Nausea And Vomiting   Levofloxacin Other (See Comments)    Caused stomach problems.   Morphine Other (See Comments)    "made me crazy"   Penicillins Nausea And Vomiting and Other (See Comments)    Has patient had a PCN reaction causing immediate rash, facial/tongue/throat swelling, SOB or lightheadedness with hypotension: No Has patient had a PCN reaction causing severe rash involving mucus membranes or skin necrosis: No Has patient had a PCN reaction that required hospitalization No Has patient had a PCN reaction occurring within the last 10 years: No If all of the above answers are "NO", then may proceed with Cephalosporin use.    Sulfonamide Derivatives Nausea And Vomiting    REVIEW OF SYSTEMS:   Review of Systems  Constitutional:  Negative for chills, fatigue and fever.  HENT:   Positive for trouble swallowing. Negative for lump/mass, mouth sores, nosebleeds and sore throat.   Eyes:  Negative for eye problems.  Respiratory:  Negative for cough and shortness of breath.   Cardiovascular:  Negative for chest pain, leg swelling and palpitations.  Gastrointestinal:  Positive for constipation and diarrhea. Negative for abdominal pain, nausea and vomiting.  Genitourinary:  Negative for bladder incontinence, difficulty urinating, dysuria, frequency, hematuria and nocturia.   Musculoskeletal:   Negative for arthralgias, back pain, flank pain, myalgias and neck pain.  Skin:  Negative for itching and rash.  Neurological:  Negative for dizziness, headaches and numbness.  Hematological:  Does not bruise/bleed easily.  Psychiatric/Behavioral:  Negative for depression, sleep disturbance and suicidal ideas. The patient is not nervous/anxious.   All other systems reviewed and are negative.    VITALS:   Blood pressure (!) 157/50, pulse (!) 55, temperature 97.6 F (36.4 C), temperature source Oral, resp. rate 18, weight 131 lb 9.6 oz (59.7 kg), SpO2 100 %.  Wt Readings from Last 3 Encounters:  09/04/22 131 lb 9.6 oz (59.7 kg)  08/21/22 131 lb 6.4 oz (59.6 kg)  08/14/22 130 lb 3.2 oz (59.1 kg)    Body mass index is 20.01 kg/m.  Performance status (ECOG): 1 - Symptomatic but completely ambulatory  PHYSICAL EXAM:   Physical Exam Vitals and nursing note reviewed. Exam conducted with a chaperone present.  Constitutional:      Appearance: Normal appearance.  Cardiovascular:     Rate and Rhythm: Normal rate and regular rhythm.     Pulses: Normal pulses.     Heart sounds: Normal heart sounds.  Pulmonary:     Effort: Pulmonary effort is normal.     Breath sounds: Normal breath sounds.  Abdominal:     Palpations: Abdomen is soft. There is no hepatomegaly, splenomegaly or mass.     Tenderness: There is no abdominal tenderness.  Musculoskeletal:     Right lower leg: No edema.     Left lower leg: No edema.  Lymphadenopathy:     Cervical: No cervical adenopathy.     Right cervical: No superficial, deep or posterior cervical adenopathy.    Left cervical: No superficial, deep or posterior cervical adenopathy.     Upper Body:     Right upper body: No supraclavicular or axillary adenopathy.     Left upper body: No supraclavicular or axillary adenopathy.  Neurological:     General: No focal deficit present.     Mental Status: He is alert and oriented to person, place, and time.   Psychiatric:        Mood and Affect: Mood normal.        Behavior: Behavior normal.     LABS:      Latest Ref Rng & Units 09/04/2022    8:10 AM 08/27/2022    9:59 AM 08/21/2022    8:43 AM  CBC  WBC 4.0 - 10.5 K/uL 3.8  4.8  4.6   Hemoglobin 13.0 - 17.0 g/dL 8.1  8.2  8.9   Hematocrit 39.0 - 52.0 % 24.2  24.6  27.1   Platelets 150 - 400 K/uL 120  47  79       Latest Ref Rng & Units 09/04/2022    8:10 AM 08/21/2022    8:43 AM 08/14/2022    7:46 AM  CMP  Glucose 70 - 99 mg/dL 91  87  119   BUN 8 - 23 mg/dL 18  21  21    Creatinine 0.61 - 1.24 mg/dL 1.47  8.29  5.62   Sodium 135 - 145 mmol/L 130  129  130   Potassium 3.5 - 5.1 mmol/L 3.7  3.8  3.4   Chloride 98 - 111 mmol/L 101  101  102   CO2 22 - 32 mmol/L 24  23  24    Calcium 8.9 - 10.3 mg/dL 8.6  8.5  8.3   Total Protein 6.5 - 8.1 g/dL 8.3  8.3  7.9   Total Bilirubin 0.3 - 1.2 mg/dL 0.7  0.7  0.8   Alkaline Phos 38 - 126 U/L 55  52  53   AST 15 - 41 U/L 19  21  16    ALT 0 - 44 U/L 22  33  26      No results found for: "CEA1", "CEA" / No results found for: "CEA1", "CEA" No results found for: "PSA1" No results found for: "ZHY865" No results found for: "HQI696"  Lab Results  Component  Value Date   TOTALPROTELP 7.4 08/27/2022   ALBUMINELP 3.1 08/27/2022   A1GS 0.2 08/27/2022   A2GS 0.6 08/27/2022   BETS 0.6 (L) 08/27/2022   GAMS 2.9 (H) 08/27/2022   MSPIKE 2.6 (H) 08/27/2022   SPEI Comment 08/27/2022   Lab Results  Component Value Date   TIBC 257 08/23/2021   TIBC 298 02/07/2021   TIBC 263 05/27/2017   FERRITIN 1,415 (H) 01/24/2022   FERRITIN 939 (H) 08/23/2021   FERRITIN 123 02/07/2021   IRONPCTSAT 90 (H) 08/23/2021   IRONPCTSAT 17 (L) 02/07/2021   IRONPCTSAT 44 05/27/2017   Lab Results  Component Value Date   LDH 115 05/01/2021   LDH 120 04/09/2021   LDH 124 04/02/2021     STUDIES:   No results found.

## 2022-09-04 ENCOUNTER — Inpatient Hospital Stay (HOSPITAL_BASED_OUTPATIENT_CLINIC_OR_DEPARTMENT_OTHER): Payer: Medicare Other | Admitting: Hematology

## 2022-09-04 ENCOUNTER — Inpatient Hospital Stay: Payer: Medicare Other

## 2022-09-04 ENCOUNTER — Inpatient Hospital Stay: Payer: Medicare Other | Attending: Hematology

## 2022-09-04 VITALS — BP 157/50 | HR 55 | Temp 97.6°F | Resp 18 | Wt 131.6 lb

## 2022-09-04 VITALS — BP 138/57 | HR 57 | Temp 97.5°F | Resp 16

## 2022-09-04 DIAGNOSIS — Z5112 Encounter for antineoplastic immunotherapy: Secondary | ICD-10-CM | POA: Diagnosis not present

## 2022-09-04 DIAGNOSIS — C9000 Multiple myeloma not having achieved remission: Secondary | ICD-10-CM | POA: Diagnosis not present

## 2022-09-04 DIAGNOSIS — D630 Anemia in neoplastic disease: Secondary | ICD-10-CM | POA: Insufficient documentation

## 2022-09-04 DIAGNOSIS — Z95828 Presence of other vascular implants and grafts: Secondary | ICD-10-CM

## 2022-09-04 DIAGNOSIS — D693 Immune thrombocytopenic purpura: Secondary | ICD-10-CM | POA: Diagnosis not present

## 2022-09-04 LAB — CBC WITH DIFFERENTIAL/PLATELET
Abs Immature Granulocytes: 0.06 10*3/uL (ref 0.00–0.07)
Basophils Absolute: 0 10*3/uL (ref 0.0–0.1)
Basophils Relative: 0 %
Eosinophils Absolute: 0.1 10*3/uL (ref 0.0–0.5)
Eosinophils Relative: 3 %
HCT: 24.2 % — ABNORMAL LOW (ref 39.0–52.0)
Hemoglobin: 8.1 g/dL — ABNORMAL LOW (ref 13.0–17.0)
Immature Granulocytes: 2 %
Lymphocytes Relative: 35 %
Lymphs Abs: 1.3 10*3/uL (ref 0.7–4.0)
MCH: 36.3 pg — ABNORMAL HIGH (ref 26.0–34.0)
MCHC: 33.5 g/dL (ref 30.0–36.0)
MCV: 108.5 fL — ABNORMAL HIGH (ref 80.0–100.0)
Monocytes Absolute: 0.6 10*3/uL (ref 0.1–1.0)
Monocytes Relative: 15 %
Neutro Abs: 1.7 10*3/uL (ref 1.7–7.7)
Neutrophils Relative %: 45 %
Platelets: 120 10*3/uL — ABNORMAL LOW (ref 150–400)
RBC: 2.23 MIL/uL — ABNORMAL LOW (ref 4.22–5.81)
WBC: 3.8 10*3/uL — ABNORMAL LOW (ref 4.0–10.5)
nRBC: 0 % (ref 0.0–0.2)

## 2022-09-04 LAB — COMPREHENSIVE METABOLIC PANEL WITH GFR
ALT: 22 U/L (ref 0–44)
AST: 19 U/L (ref 15–41)
Albumin: 2.7 g/dL — ABNORMAL LOW (ref 3.5–5.0)
Alkaline Phosphatase: 55 U/L (ref 38–126)
Anion gap: 5 (ref 5–15)
BUN: 18 mg/dL (ref 8–23)
CO2: 24 mmol/L (ref 22–32)
Calcium: 8.6 mg/dL — ABNORMAL LOW (ref 8.9–10.3)
Chloride: 101 mmol/L (ref 98–111)
Creatinine, Ser: 0.8 mg/dL (ref 0.61–1.24)
GFR, Estimated: >60 mL/min
Glucose, Bld: 91 mg/dL (ref 70–99)
Potassium: 3.7 mmol/L (ref 3.5–5.1)
Sodium: 130 mmol/L — ABNORMAL LOW (ref 135–145)
Total Bilirubin: 0.7 mg/dL (ref 0.3–1.2)
Total Protein: 8.3 g/dL — ABNORMAL HIGH (ref 6.5–8.1)

## 2022-09-04 LAB — MAGNESIUM: Magnesium: 2.1 mg/dL (ref 1.7–2.4)

## 2022-09-04 LAB — SAMPLE TO BLOOD BANK

## 2022-09-04 LAB — PHOSPHORUS: Phosphorus: 3.1 mg/dL (ref 2.5–4.6)

## 2022-09-04 MED ORDER — ONDANSETRON HCL 4 MG/2ML IJ SOLN
8.0000 mg | Freq: Once | INTRAMUSCULAR | Status: AC
  Administered 2022-09-04: 8 mg via INTRAVENOUS
  Filled 2022-09-04: qty 4

## 2022-09-04 MED ORDER — CETIRIZINE HCL 10 MG/ML IV SOLN
10.0000 mg | Freq: Once | INTRAVENOUS | Status: AC
  Administered 2022-09-04: 10 mg via INTRAVENOUS
  Filled 2022-09-04: qty 1

## 2022-09-04 MED ORDER — HEPARIN SOD (PORK) LOCK FLUSH 100 UNIT/ML IV SOLN
500.0000 [IU] | Freq: Once | INTRAVENOUS | Status: AC | PRN
  Administered 2022-09-04: 500 [IU]

## 2022-09-04 MED ORDER — SODIUM CHLORIDE 0.9 % IV SOLN
16.0000 mg/kg | Freq: Once | INTRAVENOUS | Status: AC
  Administered 2022-09-04: 900 mg via INTRAVENOUS
  Filled 2022-09-04: qty 40

## 2022-09-04 MED ORDER — ACETAMINOPHEN 325 MG PO TABS
650.0000 mg | ORAL_TABLET | Freq: Once | ORAL | Status: AC
  Administered 2022-09-04: 650 mg via ORAL
  Filled 2022-09-04: qty 2

## 2022-09-04 MED ORDER — SODIUM CHLORIDE 0.9% FLUSH
10.0000 mL | INTRAVENOUS | Status: DC | PRN
  Administered 2022-09-04: 10 mL

## 2022-09-04 MED ORDER — SODIUM CHLORIDE 0.9 % IV SOLN: Freq: Once | INTRAVENOUS | Status: AC

## 2022-09-04 MED ORDER — SODIUM CHLORIDE 0.9% FLUSH
10.0000 mL | Freq: Once | INTRAVENOUS | Status: AC
  Administered 2022-09-04: 10 mL via INTRAVENOUS

## 2022-09-04 MED ORDER — METHYLPREDNISOLONE SODIUM SUCC 125 MG IJ SOLR
100.0000 mg | Freq: Once | INTRAMUSCULAR | Status: AC
  Administered 2022-09-04: 100 mg via INTRAVENOUS
  Filled 2022-09-04: qty 2

## 2022-09-04 MED ORDER — BORTEZOMIB CHEMO SQ INJECTION 3.5 MG (2.5MG/ML)
1.0000 mg/m2 | Freq: Once | INTRAMUSCULAR | Status: AC
  Administered 2022-09-04: 1.75 mg via SUBCUTANEOUS
  Filled 2022-09-04: qty 0.7

## 2022-09-04 NOTE — Progress Notes (Signed)
Patient has been examined by Dr. Katragadda. Vital signs and labs have been reviewed by MD - ANC, Creatinine, LFTs, hemoglobin, and platelets are within treatment parameters per M.D. - pt may proceed with treatment.  Primary RN and pharmacy notified.  

## 2022-09-04 NOTE — Patient Instructions (Addendum)
[facility] at [facility] Discharge Instructions   You were seen and examined today by Dr. Ellin Saba.  He reviewed the results of your lab work which are normal/stable. You myeloma labs from last week are stable and have actually improved some. Your m-spike is 2.6, down from 2.9.   We will proceed with your treatment today.   Return as scheduled.    Thank you for choosing [facility] at [facility] to provide your oncology and hematology care.  To afford each patient quality time with our provider, please arrive at least 15 minutes before your scheduled appointment time.   If you have a lab appointment with the Cancer Center please come in thru the Main Entrance and check in at the main information desk.  You need to re-schedule your appointment should you arrive 10 or more minutes late.  We strive to give you quality time with our providers, and arriving late affects you and other patients whose appointments are after yours.  Also, if you no show three or more times for appointments you may be dismissed from the clinic at the providers discretion.     Again, thank you for choosing [facility].  Our hope is that these requests will decrease the amount of time that you wait before being seen by our physicians.       _____________________________________________________________  Should you have questions after your visit to [facility], please contact our office at [phone] and follow the prompts.  Our office hours are 8:00 a.m. and 4:30 p.m. Monday - Friday.  Please note that voicemails left after 4:00 p.m. may not be returned until the following business day.  We are closed weekends and major holidays.  You do have access to a nurse 24-7, just call the main number to the clinic [phone] and do not press any options, hold on the line and a nurse will answer the phone.    For prescription refill requests, have your  pharmacy contact our office and allow 72 hours.    Due to Covid, you will need to wear a mask upon entering the hospital. If you do not have a mask, a mask will be given to you at the Main Entrance upon arrival. For doctor visits, patients may have 1 support person age 101 or older with them. For treatment visits, patients can not have anyone with them due to social distancing guidelines and our immunocompromised population.

## 2022-09-04 NOTE — Patient Instructions (Signed)
[facility]  Discharge Instructions: Thank you for choosing [facility] to provide your oncology and hematology care.  If you have a lab appointment with the Cancer Center - please note that after April 8th, 2024, all labs will be drawn in the cancer center.  You do not have to check in or register with the main entrance as you have in the past but will complete your check-in in the cancer center.  Wear comfortable clothing and clothing appropriate for easy access to any Portacath or PICC line.   We strive to give you quality time with your provider. You may need to reschedule your appointment if you arrive late (15 or more minutes).  Arriving late affects you and other patients whose appointments are after yours.  Also, if you miss three or more appointments without notifying the office, you may be dismissed from the clinic at the provider's discretion.      For prescription refill requests, have your pharmacy contact our office and allow 72 hours for refills to be completed.    Today you received the following chemotherapy and/or immunotherapy agents Rapid infusion Daratumumab.    To help prevent nausea and vomiting after your treatment, we encourage you to take your nausea medication as directed.  BELOW ARE SYMPTOMS THAT SHOULD BE REPORTED IMMEDIATELY: *FEVER GREATER THAN 100.4 F (38 C) OR HIGHER *CHILLS OR SWEATING *NAUSEA AND VOMITING THAT IS NOT CONTROLLED WITH YOUR NAUSEA MEDICATION *UNUSUAL SHORTNESS OF BREATH *UNUSUAL BRUISING OR BLEEDING *URINARY PROBLEMS (pain or burning when urinating, or frequent urination) *BOWEL PROBLEMS (unusual diarrhea, constipation, pain near the anus) TENDERNESS IN MOUTH AND THROAT WITH OR WITHOUT PRESENCE OF ULCERS (sore throat, sores in mouth, or a toothache) UNUSUAL RASH, SWELLING OR PAIN  UNUSUAL VAGINAL DISCHARGE OR ITCHING   Items with * indicate a potential emergency and should be followed up as soon as  possible or go to the Emergency Department if any problems should occur.  Please show the CHEMOTHERAPY ALERT CARD or IMMUNOTHERAPY ALERT CARD at check-in to the Emergency Department and triage nurse.  Should you have questions after your visit or need to cancel or reschedule your appointment, please contact [facility] CENTER AT [facility] [phone]  and follow the prompts.  Office hours are 8:00 a.m. to 4:30 p.m. Monday - Friday. Please note that voicemails left after 4:00 p.m. may not be returned until the following business day.  We are closed weekends and major holidays. You have access to a nurse at all times for urgent questions. Please call the main number to the clinic [phone] and follow the prompts.  For any non-urgent questions, you may also contact your provider using MyChart. We now offer e-Visits for anyone 40 and older to request care online for non-urgent symptoms. For details visit [email or web address].   Also download the MyChart app! Go to the app store, search "MyChart", open the app, select [place], and log in with your MyChart username and password.

## 2022-09-04 NOTE — Progress Notes (Signed)
Labs reviewed , ok to treat today per MD.  Treatment given per orders. Patient tolerated it well without problems. Vitals stable and discharged home from clinic ambulatory. Follow up as scheduled.  

## 2022-09-05 ENCOUNTER — Inpatient Hospital Stay: Payer: Medicare Other

## 2022-09-06 ENCOUNTER — Other Ambulatory Visit: Payer: Self-pay | Admitting: Cardiovascular Disease

## 2022-09-10 ENCOUNTER — Ambulatory Visit: Payer: Medicare Other | Admitting: Urology

## 2022-09-10 ENCOUNTER — Other Ambulatory Visit: Payer: Self-pay

## 2022-09-11 ENCOUNTER — Inpatient Hospital Stay: Payer: Medicare Other

## 2022-09-11 VITALS — BP 175/62 | HR 72 | Temp 97.6°F | Resp 16 | Wt 132.0 lb

## 2022-09-11 DIAGNOSIS — D630 Anemia in neoplastic disease: Secondary | ICD-10-CM | POA: Diagnosis not present

## 2022-09-11 DIAGNOSIS — C9000 Multiple myeloma not having achieved remission: Secondary | ICD-10-CM

## 2022-09-11 DIAGNOSIS — Z5112 Encounter for antineoplastic immunotherapy: Secondary | ICD-10-CM | POA: Diagnosis not present

## 2022-09-11 DIAGNOSIS — D693 Immune thrombocytopenic purpura: Secondary | ICD-10-CM | POA: Diagnosis not present

## 2022-09-11 LAB — CBC WITH DIFFERENTIAL/PLATELET
Abs Immature Granulocytes: 0.04 10*3/uL (ref 0.00–0.07)
Basophils Absolute: 0 10*3/uL (ref 0.0–0.1)
Basophils Relative: 0 %
Eosinophils Absolute: 0.1 10*3/uL (ref 0.0–0.5)
Eosinophils Relative: 1 %
HCT: 24.1 % — ABNORMAL LOW (ref 39.0–52.0)
Hemoglobin: 7.8 g/dL — ABNORMAL LOW (ref 13.0–17.0)
Immature Granulocytes: 1 %
Lymphocytes Relative: 31 %
Lymphs Abs: 1.1 10*3/uL (ref 0.7–4.0)
MCH: 36.6 pg — ABNORMAL HIGH (ref 26.0–34.0)
MCHC: 32.4 g/dL (ref 30.0–36.0)
MCV: 113.1 fL — ABNORMAL HIGH (ref 80.0–100.0)
Monocytes Absolute: 0.8 10*3/uL (ref 0.1–1.0)
Monocytes Relative: 23 %
Neutro Abs: 1.5 10*3/uL — ABNORMAL LOW (ref 1.7–7.7)
Neutrophils Relative %: 44 %
Platelets: 116 10*3/uL — ABNORMAL LOW (ref 150–400)
RBC: 2.13 MIL/uL — ABNORMAL LOW (ref 4.22–5.81)
WBC: 3.5 10*3/uL — ABNORMAL LOW (ref 4.0–10.5)
nRBC: 0 % (ref 0.0–0.2)

## 2022-09-11 LAB — COMPREHENSIVE METABOLIC PANEL
ALT: 23 U/L (ref 0–44)
AST: 20 U/L (ref 15–41)
Albumin: 2.7 g/dL — ABNORMAL LOW (ref 3.5–5.0)
Alkaline Phosphatase: 56 U/L (ref 38–126)
Anion gap: 5 (ref 5–15)
BUN: 20 mg/dL (ref 8–23)
CO2: 24 mmol/L (ref 22–32)
Calcium: 8.5 mg/dL — ABNORMAL LOW (ref 8.9–10.3)
Chloride: 103 mmol/L (ref 98–111)
Creatinine, Ser: 0.84 mg/dL (ref 0.61–1.24)
GFR, Estimated: >60 mL/min (ref >60)
Glucose, Bld: 94 mg/dL (ref 70–99)
Potassium: 3.6 mmol/L (ref 3.5–5.1)
Sodium: 132 mmol/L — ABNORMAL LOW (ref 135–145)
Total Bilirubin: 0.6 mg/dL (ref 0.3–1.2)
Total Protein: 8 g/dL (ref 6.5–8.1)

## 2022-09-11 LAB — PHOSPHORUS: Phosphorus: 2.7 mg/dL (ref 2.5–4.6)

## 2022-09-11 LAB — TYPE AND SCREEN
ABO/RH(D): O POS
Antibody Screen: POSITIVE
DAT, IgG: NEGATIVE

## 2022-09-11 LAB — PREPARE RBC (CROSSMATCH)

## 2022-09-11 LAB — MAGNESIUM: Magnesium: 2 mg/dL (ref 1.7–2.4)

## 2022-09-11 MED ORDER — METHYLPREDNISOLONE SODIUM SUCC 125 MG IJ SOLR
100.0000 mg | Freq: Once | INTRAMUSCULAR | Status: AC
  Administered 2022-09-11: 100 mg via INTRAVENOUS
  Filled 2022-09-11: qty 2

## 2022-09-11 MED ORDER — CETIRIZINE HCL 10 MG/ML IV SOLN
10.0000 mg | Freq: Once | INTRAVENOUS | Status: AC
  Administered 2022-09-11: 10 mg via INTRAVENOUS
  Filled 2022-09-11: qty 1

## 2022-09-11 MED ORDER — ONDANSETRON HCL 4 MG/2ML IJ SOLN
8.0000 mg | Freq: Once | INTRAMUSCULAR | Status: AC
  Administered 2022-09-11: 8 mg via INTRAVENOUS
  Filled 2022-09-11: qty 4

## 2022-09-11 MED ORDER — SODIUM CHLORIDE 0.9 % IV SOLN: Freq: Once | INTRAVENOUS | Status: AC

## 2022-09-11 MED ORDER — ACETAMINOPHEN 325 MG PO TABS
650.0000 mg | ORAL_TABLET | Freq: Once | ORAL | Status: AC
  Administered 2022-09-11: 650 mg via ORAL
  Filled 2022-09-11: qty 2

## 2022-09-11 MED ORDER — SODIUM CHLORIDE 0.9 % IV SOLN
16.0000 mg/kg | Freq: Once | INTRAVENOUS | Status: AC
  Administered 2022-09-11: 900 mg via INTRAVENOUS
  Filled 2022-09-11: qty 40

## 2022-09-11 MED ORDER — BORTEZOMIB CHEMO SQ INJECTION 3.5 MG (2.5MG/ML)
1.0000 mg/m2 | Freq: Once | INTRAMUSCULAR | Status: AC
  Administered 2022-09-11: 1.75 mg via SUBCUTANEOUS
  Filled 2022-09-11: qty 0.7

## 2022-09-11 MED ORDER — HEPARIN SOD (PORK) LOCK FLUSH 100 UNIT/ML IV SOLN
500.0000 [IU] | Freq: Once | INTRAVENOUS | Status: AC | PRN
  Administered 2022-09-11: 500 [IU]

## 2022-09-11 MED ORDER — SODIUM CHLORIDE 0.9% FLUSH
10.0000 mL | INTRAVENOUS | Status: DC | PRN
  Administered 2022-09-11: 10 mL

## 2022-09-11 NOTE — Progress Notes (Signed)
Hemoglobin 7.8. Orders put in for one unit of blood per MD for tomorrow. Patient is aware.

## 2022-09-11 NOTE — Progress Notes (Signed)
Patient presents today for Darzalex IV and Velcade infusion.  Patient is in satisfactory condition with no new complaints voiced.  Vital signs are stable.  Labs reviewed and other labs are within treatment parameters. Pt's hemoglobin is 7.8 today per Dr.K's standing order. Pt will receive 1 unit of blood tomorrow. Pt made aware and verbalized understanding. We will proceed with treatment per MD orders.    Treatment given today per MD orders. Tolerated infusion without adverse affects. Vital signs stable. No complaints at this time. Discharged from clinic ambulatory in stable condition. Alert and oriented x 3. F/U with [facility] as scheduled.

## 2022-09-11 NOTE — Patient Instructions (Signed)
[facility]  Discharge Instructions: Thank you for choosing [facility] to provide your oncology and hematology care.  If you have a lab appointment with the Cancer Center - please note that after April 8th, 2024, all labs will be drawn in the cancer center.  You do not have to check in or register with the main entrance as you have in the past but will complete your check-in in the cancer center.  Wear comfortable clothing and clothing appropriate for easy access to any Portacath or PICC line.   We strive to give you quality time with your provider. You may need to reschedule your appointment if you arrive late (15 or more minutes).  Arriving late affects you and other patients whose appointments are after yours.  Also, if you miss three or more appointments without notifying the office, you may be dismissed from the clinic at the provider's discretion.      For prescription refill requests, have your pharmacy contact our office and allow 72 hours for refills to be completed.    Today you received the following chemotherapy and/or immunotherapy agents Dara McKinney and Velcade   To help prevent nausea and vomiting after your treatment, we encourage you to take your nausea medication as directed.  Daratumumab Injection What is this medication? DARATUMUMAB (dar a toom ue mab) treats multiple myeloma, a type of bone marrow cancer. It works by helping your immune system slow or stop the spread of cancer cells. It is a monoclonal antibody. This medicine may be used for other purposes; ask your health care provider or pharmacist if you have questions. COMMON BRAND NAME(S): DARZALEX What should I tell my care team before I take this medication? They need to know if you have any of these conditions: Hereditary fructose intolerance Infection, such as chickenpox, herpes, hepatitis B Lung or breathing disease, such as asthma, COPD An unusual or allergic reaction to  daratumumab, sorbitol, other medications, foods, dyes, or preservatives Pregnant or trying to get pregnant Breastfeeding How should I use this medication? This medication is injected into a vein. It is given by your care team in a hospital or clinic setting. Talk to your care team about the use of this medication in children. Special care may be needed. Overdosage: If you think you have taken too much of this medicine contact a poison control center or emergency room at once. NOTE: This medicine is only for you. Do not share this medicine with others. What if I miss a dose? Keep appointments for follow-up doses. It is important not to miss your dose. Call your care team if you are unable to keep an appointment. What may interact with this medication? Interactions have not been studied. This list may not describe all possible interactions. Give your health care provider a list of all the medicines, herbs, non-prescription drugs, or dietary supplements you use. Also tell them if you smoke, drink alcohol, or use illegal drugs. Some items may interact with your medicine. What should I watch for while using this medication? Your condition will be monitored carefully while you are receiving this medication. This medication can cause serious allergic reactions. To reduce your risk, your care team may give you other medication to take before receiving this one. Be sure to follow the directions from your care team. This medication can affect the results of blood tests to match your blood type. These changes can last for up to 6 months after the final dose.  Your care team will do blood tests to match your blood type before you start treatment. Tell all of your care team that you are being treated with this medication before receiving a blood transfusion. This medication can affect the results of some tests used to determine treatment response; extra tests may be needed to evaluate response. Talk to your care  team if you wish to become pregnant or think you are pregnant. This medication can cause serious birth defects if taken during pregnancy and for 3 months after the last dose. A reliable form of contraception is recommended while taking this medication and for 3 months after the last dose. Talk to your care team about effective forms of contraception. Do not breast-feed while taking this medication. What side effects may I notice from receiving this medication? Side effects that you should report to your care team as soon as possible: Allergic reactions--skin rash, itching, hives, swelling of the face, lips, tongue, or throat Infection--fever, chills, cough, sore throat, wounds that don't heal, pain or trouble when passing urine, general feeling of discomfort or being unwell Infusion reactions--chest pain, shortness of breath or trouble breathing, feeling faint or lightheaded Unusual bruising or bleeding Side effects that usually do not require medical attention (report to your care team if they continue or are bothersome): Constipation Diarrhea Fatigue Nausea Pain, tingling, or numbness in the hands or feet Swelling of the ankles, hands, or feet This list may not describe all possible side effects. Call your doctor for medical advice about side effects. You may report side effects to FDA at 1-800-FDA-1088. Where should I keep my medication? This medication is given in a hospital or clinic. It will not be stored at home. NOTE: This sheet is a summary. It may not cover all possible information. If you have questions about this medicine, talk to your doctor, pharmacist, or health care provider.  2023 Elsevier/Gold Standard (2021-08-15 00:00:00)  Bortezomib Injection What is this medication? BORTEZOMIB (bor TEZ oh mib) treats lymphoma. It may also be used to treat multiple myeloma, a type of bone marrow cancer. It works by blocking a protein that causes cancer cells to grow and multiply. This  helps to slow or stop the spread of cancer cells. This medicine may be used for other purposes; ask your health care provider or pharmacist if you have questions. COMMON BRAND NAME(S): Velcade What should I tell my care team before I take this medication? They need to know if you have any of these conditions: Dehydration Diabetes Heart disease Liver disease Tingling of the fingers or toes or other nerve disorder An unusual or allergic reaction to bortezomib, other medications, foods, dyes, or preservatives If you or your partner are pregnant or trying to get pregnant Breastfeeding How should I use this medication? This medication is injected into a vein or under the skin. It is given by your care team in a hospital or clinic setting. Talk to your care team about the use of this medication in children. Special care may be needed. Overdosage: If you think you have taken too much of this medicine contact a poison control center or emergency room at once. NOTE: This medicine is only for you. Do not share this medicine with others. What if I miss a dose? Keep appointments for follow-up doses. It is important not to miss your dose. Call your care team if you are unable to keep an appointment. What may interact with this medication? Ketoconazole Rifampin This list may not describe  all possible interactions. Give your health care provider a list of all the medicines, herbs, non-prescription drugs, or dietary supplements you use. Also tell them if you smoke, drink alcohol, or use illegal drugs. Some items may interact with your medicine. What should I watch for while using this medication? Your condition will be monitored carefully while you are receiving this medication. You may need blood work while taking this medication. This medication may affect your coordination, reaction time, or judgment. Do not drive or operate machinery until you know how this medication affects you. Sit up or stand  slowly to reduce the risk of dizzy or fainting spells. Drinking alcohol with this medication can increase the risk of these side effects. This medication may increase your risk of getting an infection. Call your care team for advice if you get a fever, chills, sore throat, or other symptoms of a cold or flu. Do not treat yourself. Try to avoid being around people who are sick. Check with your care team if you have severe diarrhea, nausea, and vomiting, or if you sweat a lot. The loss of too much body fluid may make it dangerous for you to take this medication. Talk to your care team if you may be pregnant. Serious birth defects can occur if you take this medication during pregnancy and for 7 months after the last dose. You will need a negative pregnancy test before starting this medication. Contraception is recommended while taking this medication and for 7 months after the last dose. Your care team can help you find the option that works for you. If your partner can get pregnant, use a condom during sex while taking this medication and for 4 months after the last dose. Do not breastfeed while taking this medication and for 2 months after the last dose. This medication may cause infertility. Talk to your care team if you are concerned about your fertility. What side effects may I notice from receiving this medication? Side effects that you should report to your care team as soon as possible: Allergic reactions--skin rash, itching, hives, swelling of the face, lips, tongue, or throat Bleeding--bloody or black, tar-like stools, vomiting blood or brown material that looks like coffee grounds, red or dark brown urine, small red or purple spots on skin, unusual bruising or bleeding Bleeding in the brain--severe headache, stiff neck, confusion, dizziness, change in vision, numbness or weakness of the face, arm, or leg, trouble speaking, trouble walking, vomiting Bowel blockage--stomach cramping, unable to have  a bowel movement or pass gas, loss of appetite, vomiting Heart failure--shortness of breath, swelling of the ankles, feet, or hands, sudden weight gain, unusual weakness or fatigue Infection--fever, chills, cough, sore throat, wounds that don't heal, pain or trouble when passing urine, general feeling of discomfort or being unwell Liver injury--right upper belly pain, loss of appetite, nausea, light-colored stool, dark yellow or brown urine, yellowing skin or eyes, unusual weakness or fatigue Low blood pressure--dizziness, feeling faint or lightheaded, blurry vision Lung injury--shortness of breath or trouble breathing, cough, spitting up blood, chest pain, fever Pain, tingling, or numbness in the hands or feet Severe or prolonged diarrhea Stomach pain, bloody diarrhea, pale skin, unusual weakness or fatigue, decrease in the amount of urine, which may be signs of hemolytic uremic syndrome Sudden and severe headache, confusion, change in vision, seizures, which may be signs of posterior reversible encephalopathy syndrome (PRES) TTP--purple spots on the skin or inside the mouth, pale skin, yellowing skin or eyes, unusual weakness or  fatigue, fever, fast or irregular heartbeat, confusion, change in vision, trouble speaking, trouble walking Tumor lysis syndrome (TLS)--nausea, vomiting, diarrhea, decrease in the amount of urine, dark urine, unusual weakness or fatigue, confusion, muscle pain or cramps, fast or irregular heartbeat, joint pain Side effects that usually do not require medical attention (report to your care team if they continue or are bothersome): Constipation Diarrhea Fatigue Loss of appetite Nausea This list may not describe all possible side effects. Call your doctor for medical advice about side effects. You may report side effects to FDA at 1-800-FDA-1088. Where should I keep my medication? This medication is given in a hospital or clinic. It will not be stored at home. NOTE: This  sheet is a summary. It may not cover all possible information. If you have questions about this medicine, talk to your doctor, pharmacist, or health care provider.  2023 Elsevier/Gold Standard (2021-09-19 00:00:00)    BELOW ARE SYMPTOMS THAT SHOULD BE REPORTED IMMEDIATELY: *FEVER GREATER THAN 100.4 F (38 C) OR HIGHER *CHILLS OR SWEATING *NAUSEA AND VOMITING THAT IS NOT CONTROLLED WITH YOUR NAUSEA MEDICATION *UNUSUAL SHORTNESS OF BREATH *UNUSUAL BRUISING OR BLEEDING *URINARY PROBLEMS (pain or burning when urinating, or frequent urination) *BOWEL PROBLEMS (unusual diarrhea, constipation, pain near the anus) TENDERNESS IN MOUTH AND THROAT WITH OR WITHOUT PRESENCE OF ULCERS (sore throat, sores in mouth, or a toothache) UNUSUAL RASH, SWELLING OR PAIN  UNUSUAL VAGINAL DISCHARGE OR ITCHING   Items with * indicate a potential emergency and should be followed up as soon as possible or go to the Emergency Department if any problems should occur.  Please show the CHEMOTHERAPY ALERT CARD or IMMUNOTHERAPY ALERT CARD at check-in to the Emergency Department and triage nurse.  Should you have questions after your visit or need to cancel or reschedule your appointment, please contact [facility] CENTER AT [facility] [phone]  and follow the prompts.  Office hours are 8:00 a.m. to 4:30 p.m. Monday - Friday. Please note that voicemails left after 4:00 p.m. may not be returned until the following business day.  We are closed weekends and major holidays. You have access to a nurse at all times for urgent questions. Please call the main number to the clinic [phone] and follow the prompts.  For any non-urgent questions, you may also contact your provider using MyChart. We now offer e-Visits for anyone 52 and older to request care online for non-urgent symptoms. For details visit [email or web address].   Also download the MyChart app! Go to the app store, search "MyChart", open the app, select [facility], and log in with your MyChart username and password.

## 2022-09-12 ENCOUNTER — Inpatient Hospital Stay: Payer: Medicare Other

## 2022-09-12 DIAGNOSIS — C9000 Multiple myeloma not having achieved remission: Secondary | ICD-10-CM | POA: Diagnosis not present

## 2022-09-12 DIAGNOSIS — D630 Anemia in neoplastic disease: Secondary | ICD-10-CM | POA: Diagnosis not present

## 2022-09-12 DIAGNOSIS — Z5112 Encounter for antineoplastic immunotherapy: Secondary | ICD-10-CM | POA: Diagnosis not present

## 2022-09-12 DIAGNOSIS — D693 Immune thrombocytopenic purpura: Secondary | ICD-10-CM | POA: Diagnosis not present

## 2022-09-12 LAB — BPAM RBC
Blood Product Expiration Date: 202406052359
Unit Type and Rh: 5100

## 2022-09-12 LAB — TYPE AND SCREEN

## 2022-09-12 MED ORDER — SODIUM CHLORIDE 0.9% IV SOLUTION
250.0000 mL | Freq: Once | INTRAVENOUS | Status: AC
  Administered 2022-09-12: 250 mL via INTRAVENOUS

## 2022-09-12 MED ORDER — SODIUM CHLORIDE 0.9% FLUSH
10.0000 mL | INTRAVENOUS | Status: AC | PRN
  Administered 2022-09-12: 10 mL

## 2022-09-12 MED ORDER — ACETAMINOPHEN 325 MG PO TABS
650.0000 mg | ORAL_TABLET | Freq: Once | ORAL | Status: AC
  Administered 2022-09-12: 650 mg via ORAL

## 2022-09-12 MED ORDER — DIPHENHYDRAMINE HCL 25 MG PO CAPS
25.0000 mg | ORAL_CAPSULE | Freq: Once | ORAL | Status: AC
  Administered 2022-09-12: 25 mg via ORAL

## 2022-09-12 MED ORDER — HEPARIN SOD (PORK) LOCK FLUSH 100 UNIT/ML IV SOLN
500.0000 [IU] | Freq: Every day | INTRAVENOUS | Status: AC | PRN
  Administered 2022-09-12: 500 [IU]

## 2022-09-12 NOTE — Patient Instructions (Signed)
[facility]  Discharge Instructions: Thank you for choosing [facility] to provide your oncology and hematology care.  If you have a lab appointment with the Cancer Center - please note that after April 8th, 2024, all labs will be drawn in the cancer center.  You do not have to check in or register with the main entrance as you have in the past but will complete your check-in in the cancer center.  Wear comfortable clothing and clothing appropriate for easy access to any Portacath or PICC line.   We strive to give you quality time with your provider. You may need to reschedule your appointment if you arrive late (15 or more minutes).  Arriving late affects you and other patients whose appointments are after yours.  Also, if you miss three or more appointments without notifying the office, you may be dismissed from the clinic at the provider's discretion.      For prescription refill requests, have your pharmacy contact our office and allow 72 hours for refills to be completed.    Today you received the following chemotherapy and/or immunotherapy agents 1 uhit of blood Blood Transfusion, Adult, Care After The following information offers guidance on how to care for yourself after your procedure. Your health care provider may also give you more specific instructions. If you have problems or questions, contact your health care provider. What can I expect after the procedure? After the procedure, it is common to have: Bruising and soreness where the IV was inserted. A headache. Follow these instructions at home: IV insertion site care     Follow instructions from your health care provider about how to take care of your IV insertion site. Make sure you: Wash your hands with soap and water for at least 20 seconds before and after you change your bandage (dressing). If soap and water are not available, use hand sanitizer. Change your dressing as told by your  health care provider. Check your IV insertion site every day for signs of infection. Check for: Redness, swelling, or pain. Bleeding from the site. Warmth. Pus or a bad smell. General instructions Take over-the-counter and prescription medicines only as told by your health care provider. Rest as told by your health care provider. Return to your normal activities as told by your health care provider. Keep all follow-up visits. Lab tests may need to be done at certain periods to recheck your blood counts. Contact a health care provider if: You have itching or red, swollen areas of skin (hives). You have a fever or chills. You have pain in the head, back, or chest. You feel anxious or you feel weak after doing your normal activities. You have redness, swelling, warmth, or pain around the IV insertion site. You have blood coming from the IV insertion site that does not stop with pressure. You have pus or a bad smell coming from your IV insertion site. If you received your blood transfusion in an outpatient setting, you will be told whom to contact to report any reactions. Get help right away if: You have symptoms of a serious allergic or immune system reaction, including: Trouble breathing or shortness of breath. Swelling of the face, feeling flushed, or widespread rash. Dark urine or blood in the urine. Fast heartbeat. These symptoms may be an emergency. Get help right away. Call 911. Do not wait to see if the symptoms will go away. Do not drive yourself to the hospital. Summary Bruising and soreness  around the IV insertion site are common. Check your IV insertion site every day for signs of infection. Rest as told by your health care provider. Return to your normal activities as told by your health care provider. Get help right away for symptoms of a serious allergic or immune system reaction to the blood transfusion. This information is not intended to replace advice given to you by  your health care provider. Make sure you discuss any questions you have with your health care provider. Document Revised: 07/20/2021 Document Reviewed: 07/20/2021 Elsevier Patient Education  2023 Elsevier Inc.       To help prevent nausea and vomiting after your treatment, we encourage you to take your nausea medication as directed.  BELOW ARE SYMPTOMS THAT SHOULD BE REPORTED IMMEDIATELY: *FEVER GREATER THAN 100.4 F (38 C) OR HIGHER *CHILLS OR SWEATING *NAUSEA AND VOMITING THAT IS NOT CONTROLLED WITH YOUR NAUSEA MEDICATION *UNUSUAL SHORTNESS OF BREATH *UNUSUAL BRUISING OR BLEEDING *URINARY PROBLEMS (pain or burning when urinating, or frequent urination) *BOWEL PROBLEMS (unusual diarrhea, constipation, pain near the anus) TENDERNESS IN MOUTH AND THROAT WITH OR WITHOUT PRESENCE OF ULCERS (sore throat, sores in mouth, or a toothache) UNUSUAL RASH, SWELLING OR PAIN  UNUSUAL VAGINAL DISCHARGE OR ITCHING   Items with * indicate a potential emergency and should be followed up as soon as possible or go to the Emergency Department if any problems should occur.  Please show the CHEMOTHERAPY ALERT CARD or IMMUNOTHERAPY ALERT CARD at check-in to the Emergency Department and triage nurse.  Should you have questions after your visit or need to cancel or reschedule your appointment, please contact [facility]ENTER AT [facility] [phone]  and follow the prompts.  Office hours are 8:00 a.m. to 4:30 p.m. Monday - Friday. Please note that voicemails left after 4:00 p.m. may not be returned until the following business day.  We are closed weekends and major holidays. You have access to a nurse at all times for urgent questions. Please call the main number to the clinic [phone] and follow the prompts.  For any non-urgent questions, you may also contact your provider using MyChart. We now offer e-Visits for anyone 45 and older to request care online for non-urgent symptoms. For details visit  [email or web address].   Also download the MyChart app! Go to the app store, search "MyChart", open the app, select [place], and log in with your MyChart username and password.

## 2022-09-12 NOTE — Progress Notes (Signed)
Patient presents today for 1 unit of PRBC's. Patient denies any complaints today.   1 unit of PRBC's given today per MD orders. Tolerated infusion without adverse affects. Vital signs stable. No complaints at this time. Discharged from clinic ambulatory in stable condition. Alert and oriented x 3. F/U with [facility] as scheduled.

## 2022-09-13 LAB — TYPE AND SCREEN: Unit division: 0

## 2022-09-16 ENCOUNTER — Other Ambulatory Visit: Payer: Self-pay

## 2022-09-16 DIAGNOSIS — C9000 Multiple myeloma not having achieved remission: Secondary | ICD-10-CM

## 2022-09-18 ENCOUNTER — Inpatient Hospital Stay: Payer: Medicare Other

## 2022-09-18 VITALS — BP 163/64 | HR 73 | Temp 97.2°F | Resp 18

## 2022-09-18 DIAGNOSIS — C9000 Multiple myeloma not having achieved remission: Secondary | ICD-10-CM | POA: Diagnosis not present

## 2022-09-18 DIAGNOSIS — D693 Immune thrombocytopenic purpura: Secondary | ICD-10-CM | POA: Diagnosis not present

## 2022-09-18 DIAGNOSIS — Z5112 Encounter for antineoplastic immunotherapy: Secondary | ICD-10-CM | POA: Diagnosis not present

## 2022-09-18 DIAGNOSIS — D630 Anemia in neoplastic disease: Secondary | ICD-10-CM | POA: Diagnosis not present

## 2022-09-18 LAB — COMPREHENSIVE METABOLIC PANEL
ALT: 20 U/L (ref 0–44)
AST: 18 U/L (ref 15–41)
Albumin: 2.7 g/dL — ABNORMAL LOW (ref 3.5–5.0)
Alkaline Phosphatase: 54 U/L (ref 38–126)
Anion gap: 5 (ref 5–15)
BUN: 15 mg/dL (ref 8–23)
CO2: 25 mmol/L (ref 22–32)
Calcium: 8.4 mg/dL — ABNORMAL LOW (ref 8.9–10.3)
Chloride: 102 mmol/L (ref 98–111)
Creatinine, Ser: 0.76 mg/dL (ref 0.61–1.24)
GFR, Estimated: >60 mL/min (ref >60)
Glucose, Bld: 95 mg/dL (ref 70–99)
Potassium: 3.7 mmol/L (ref 3.5–5.1)
Sodium: 132 mmol/L — ABNORMAL LOW (ref 135–145)
Total Bilirubin: 0.7 mg/dL (ref 0.3–1.2)
Total Protein: 7.6 g/dL (ref 6.5–8.1)

## 2022-09-18 LAB — CBC WITH DIFFERENTIAL/PLATELET
Abs Immature Granulocytes: 0.02 10*3/uL (ref 0.00–0.07)
Basophils Absolute: 0 10*3/uL (ref 0.0–0.1)
Basophils Relative: 0 %
Eosinophils Absolute: 0 10*3/uL (ref 0.0–0.5)
Eosinophils Relative: 1 %
HCT: 26.7 % — ABNORMAL LOW (ref 39.0–52.0)
Hemoglobin: 8.7 g/dL — ABNORMAL LOW (ref 13.0–17.0)
Immature Granulocytes: 1 %
Lymphocytes Relative: 24 %
Lymphs Abs: 0.9 10*3/uL (ref 0.7–4.0)
MCH: 33 pg (ref 26.0–34.0)
MCHC: 32.6 g/dL (ref 30.0–36.0)
MCV: 101.1 fL — ABNORMAL HIGH (ref 80.0–100.0)
Monocytes Absolute: 0.8 10*3/uL (ref 0.1–1.0)
Monocytes Relative: 21 %
Neutro Abs: 1.9 10*3/uL (ref 1.7–7.7)
Neutrophils Relative %: 53 %
Platelets: 90 10*3/uL — ABNORMAL LOW (ref 150–400)
RBC: 2.64 MIL/uL — ABNORMAL LOW (ref 4.22–5.81)
WBC: 3.6 10*3/uL — ABNORMAL LOW (ref 4.0–10.5)
nRBC: 0 % (ref 0.0–0.2)

## 2022-09-18 LAB — SAMPLE TO BLOOD BANK

## 2022-09-18 LAB — MAGNESIUM: Magnesium: 2 mg/dL (ref 1.7–2.4)

## 2022-09-18 LAB — PHOSPHORUS: Phosphorus: 3.1 mg/dL (ref 2.5–4.6)

## 2022-09-18 MED ORDER — SODIUM CHLORIDE 0.9% FLUSH
10.0000 mL | INTRAVENOUS | Status: DC | PRN
  Administered 2022-09-18: 10 mL

## 2022-09-18 MED ORDER — ACETAMINOPHEN 325 MG PO TABS
650.0000 mg | ORAL_TABLET | Freq: Once | ORAL | Status: AC
  Administered 2022-09-18: 650 mg via ORAL
  Filled 2022-09-18: qty 2

## 2022-09-18 MED ORDER — SODIUM CHLORIDE 0.9 % IV SOLN
16.0000 mg/kg | Freq: Once | INTRAVENOUS | Status: AC
  Administered 2022-09-18: 900 mg via INTRAVENOUS
  Filled 2022-09-18: qty 40

## 2022-09-18 MED ORDER — HEPARIN SOD (PORK) LOCK FLUSH 100 UNIT/ML IV SOLN
500.0000 [IU] | Freq: Once | INTRAVENOUS | Status: AC | PRN
  Administered 2022-09-18: 500 [IU]

## 2022-09-18 MED ORDER — BORTEZOMIB CHEMO SQ INJECTION 3.5 MG (2.5MG/ML)
1.0000 mg/m2 | Freq: Once | INTRAMUSCULAR | Status: AC
  Administered 2022-09-18: 1.75 mg via SUBCUTANEOUS
  Filled 2022-09-18: qty 0.7

## 2022-09-18 MED ORDER — METHYLPREDNISOLONE SODIUM SUCC 125 MG IJ SOLR
100.0000 mg | Freq: Once | INTRAMUSCULAR | Status: AC
  Administered 2022-09-18: 100 mg via INTRAVENOUS
  Filled 2022-09-18: qty 2

## 2022-09-18 MED ORDER — SODIUM CHLORIDE 0.9 % IV SOLN: Freq: Once | INTRAVENOUS | Status: AC

## 2022-09-18 MED ORDER — ONDANSETRON HCL 4 MG/2ML IJ SOLN
8.0000 mg | Freq: Once | INTRAMUSCULAR | Status: AC
  Administered 2022-09-18: 8 mg via INTRAVENOUS
  Filled 2022-09-18: qty 4

## 2022-09-18 MED ORDER — CETIRIZINE HCL 10 MG/ML IV SOLN
10.0000 mg | Freq: Once | INTRAVENOUS | Status: AC
  Administered 2022-09-18: 10 mg via INTRAVENOUS
  Filled 2022-09-18: qty 1

## 2022-09-18 NOTE — Progress Notes (Signed)
Patient presents today for Rapid Daratumumab and Velcade injection per providers order.  Vital signs within parameters for treatment.  Labs pending.  Patient has no new complaints at this time.  Platelets noted to be 90, MD notified.  Message received from Chapman Moss RN/Dr. Ellin Saba okay to proceed with treatment.  Treatment given today per MD orders.  Stable during infusion without adverse affects.  Velcade administration without incident; injection site WNL; see MAR for injection details.  Patient tolerated procedure well and without incident.  No questions or complaints noted at this time. Vital signs stable. Discharge from clinic ambulatory in stable condition.  Alert and oriented X 3.  Follow up with [facility] as scheduled.

## 2022-09-18 NOTE — Patient Instructions (Signed)
[facility]  Discharge Instructions: Thank you for choosing [facility] to provide your oncology and hematology care.  If you have a lab appointment with the Cancer Center - please note that after April 8th, 2024, all labs will be drawn in the cancer center.  You do not have to check in or register with the main entrance as you have in the past but will complete your check-in in the cancer center.  Wear comfortable clothing and clothing appropriate for easy access to any Portacath or PICC line.   We strive to give you quality time with your provider. You may need to reschedule your appointment if you arrive late (15 or more minutes).  Arriving late affects you and other patients whose appointments are after yours.  Also, if you miss three or more appointments without notifying the office, you may be dismissed from the clinic at the provider's discretion.      For prescription refill requests, have your pharmacy contact our office and allow 72 hours for refills to be completed.    Today you received the following chemotherapy and/or immunotherapy agents Daratumumab/Velcade      To help prevent nausea and vomiting after your treatment, we encourage you to take your nausea medication as directed.  BELOW ARE SYMPTOMS THAT SHOULD BE REPORTED IMMEDIATELY: *FEVER GREATER THAN 100.4 F (38 C) OR HIGHER *CHILLS OR SWEATING *NAUSEA AND VOMITING THAT IS NOT CONTROLLED WITH YOUR NAUSEA MEDICATION *UNUSUAL SHORTNESS OF BREATH *UNUSUAL BRUISING OR BLEEDING *URINARY PROBLEMS (pain or burning when urinating, or frequent urination) *BOWEL PROBLEMS (unusual diarrhea, constipation, pain near the anus) TENDERNESS IN MOUTH AND THROAT WITH OR WITHOUT PRESENCE OF ULCERS (sore throat, sores in mouth, or a toothache) UNUSUAL RASH, SWELLING OR PAIN  UNUSUAL VAGINAL DISCHARGE OR ITCHING   Items with * indicate a potential emergency and should be followed up as soon as possible or  go to the Emergency Department if any problems should occur.  Please show the CHEMOTHERAPY ALERT CARD or IMMUNOTHERAPY ALERT CARD at check-in to the Emergency Department and triage nurse.  Should you have questions after your visit or need to cancel or reschedule your appointment, please contact [facility] [phone]  and follow the prompts.  Office hours are 8:00 a.m. to 4:30 p.m. Monday - Friday. Please note that voicemails left after 4:00 p.m. may not be returned until the following business day.  We are closed weekends and major holidays. You have access to a nurse at all times for urgent questions. Please call the main number to the clinic [phone] and follow the prompts.  For any non-urgent questions, you may also contact your provider using MyChart. We now offer e-Visits for anyone 11 and older to request care online for non-urgent symptoms. For details visit [email or web address].   Also download the MyChart app! Go to the app store, search "MyChart", open the app, select [place], and log in with your MyChart username and password.

## 2022-09-19 ENCOUNTER — Inpatient Hospital Stay: Payer: Medicare Other

## 2022-09-20 ENCOUNTER — Ambulatory Visit: Payer: Medicare Other | Attending: Cardiovascular Disease | Admitting: Cardiovascular Disease

## 2022-09-20 ENCOUNTER — Encounter: Payer: Self-pay | Admitting: Cardiovascular Disease

## 2022-09-20 VITALS — BP 162/66 | HR 63 | Ht 68.0 in | Wt 136.2 lb

## 2022-09-20 DIAGNOSIS — I2510 Atherosclerotic heart disease of native coronary artery without angina pectoris: Secondary | ICD-10-CM | POA: Insufficient documentation

## 2022-09-20 DIAGNOSIS — M25472 Effusion, left ankle: Secondary | ICD-10-CM | POA: Diagnosis not present

## 2022-09-20 DIAGNOSIS — C9000 Multiple myeloma not having achieved remission: Secondary | ICD-10-CM

## 2022-09-20 DIAGNOSIS — I10 Essential (primary) hypertension: Secondary | ICD-10-CM | POA: Diagnosis not present

## 2022-09-20 DIAGNOSIS — E785 Hyperlipidemia, unspecified: Secondary | ICD-10-CM | POA: Insufficient documentation

## 2022-09-20 DIAGNOSIS — E039 Hypothyroidism, unspecified: Secondary | ICD-10-CM

## 2022-09-20 NOTE — Patient Instructions (Signed)
Medication Instructions:  The current medical regimen is effective;  continue present plan and medications.  *If you need a refill on your cardiac medications before your next appointment, please call your pharmacy*   Follow-Up: At [facility], you and your health needs are our priority.  As part of our continuing mission to provide you with exceptional heart care, we have created designated Provider Care Teams.  These Care Teams include your primary Cardiologist (physician) and Advanced Practice Providers (APPs -  Physician Assistants and Nurse Practitioners) who all work together to provide you with the care you need, when you need it.  We recommend signing up for the patient portal called "MyChart".  Sign up information is provided on this After Visit Summary.  MyChart is used to connect with patients for Virtual Visits (Telemedicine).  Patients are able to view lab/test results, encounter notes, upcoming appointments, etc.  Non-urgent messages can be sent to your provider as well.   To learn more about what you can do with MyChart, go to [email or web address].    Your next appointment:   9 month(s)  Provider:   Thomas Kelly, MD       

## 2022-09-20 NOTE — Progress Notes (Unsigned)
Patient ID: Dale [name], male   DOB: 1928/10/03, 89 y.o.   MRN: [id number]      Primary M.D.: Dr. Elfredia Nevins  HPI: Dale [name] is a 89 y.o. male who presents to the office for a 6 month cardiology evaluation.  Dale [name] has known CAD and underwent initial intervention to his RCA in 2000. In 2009 a Cypher stent was placed beyond the acute margin of his RCA. His last cardiac catheterization was in September 2013 by Dr. Allyson Sabal which showed 30% LAD narrowing after the second diagonal vessel, and normal left circumflex coronary artery, and patent RCA stents.  He has a history of hypothyroidism on Synthroid replacement and has a history of hyperlipidemia. He had been on on Crestor 5 mg plus Zetia 10 mg; however, due to recent increased cost of Zetia, this was discontinued and he has been on Crestor 20 mg daily.  He also has a history of GERD treated with Protonix, and he continues to be on dual antiplatelet therapy. He is responsive to Plavix on previous P2 Y12 testing.  When I saw him in 2016 he had experienced 3-4 episodes of chest pain over the six-month period.  Most of these episodes have occurred at night and were nitrate responsive.  He states that they were similar to his previous discomfort.  When I saw him, I further titrated his isosorbide mononitrate to 60 mg in the morning and 30 mg at night.   He saw Azalee Course, [place] in August 2018 with weight loss, weakness, and vague chest pain.  He felt his chest pain was atypical and not ischemic.  An echo Doppler study on 12/23/2016 showed an EF of 55-60%.  He was hospitalized on August 29 through January 03, 2017 at  [facility].  Apparently he had a nuclear stress test which did not reveal any reversible ischemia.  He was also felt to have acute sinusitis for which he was treated with antibiotics.  He has a history of renal cell CA.Marland Kitchen     When I saw him in October 2018, his chest pain had resolved. He underwent a carotid duplex study in October 2018  which showed mild plaque bilaterally at 1-39%. He denies increasing episodes of chest pain.  At times he notes rare chest discomfort which is short-lived.  He had developed some gross hematuria and was found to have a kidney stone which he underwent cystoscopy, left ureteral pyelogram with fluoroscopic interpretation, left ureteroscopy, holmium laser lithotripsy and extraction of left mid ureteral stone, placement of 24 x 6 French contour double-J stent, without tether by Dr Retta Diones on 06/16/2017.  I saw him in March 2019.  Since that time, he has been seen by Azalee Course, PAC on 2 occasions in October.  He was seen initially on February 03, 2018 for bilateral ankle edema at which time his hydrochlorothiazide was switched to Lasix.  When seen in follow-up he continued to have ankle edema and Lasix was further titrated to 40 mg and potassium supplementation was increased.  I saw him in January 2020.  Several weeks prior to that evaluation he had experienced an episode of sharp chest ache which was nonexertional.    He denied recurrent anginal type symptoms.  He was sleepy during the day but admits that he is sleeping well at night.  He typically goes to bed at 11 PM and wakes up at 7 AM.    When I saw him in May 2020, he had noticed progressive  lower extremity leg edema for several weeks previously.  He apparently was not taking his Lasix in the morning but had been taking this at 5 PM.  He denied chest pain, awareness of abnormal heart rhythm, PND orthopnea.  During that evaluation, I suggested that he take his Lasix 40 mg in the morning and for the next several days take 20 mg in the later afternoon and if edema persisted to continue to take the Lasix perhaps every other day in the afternoon as well.  He states typically he does not take his Lasix in the morning but typically takes his 40 mg at lunchtime.  He then waits till suppertime around 7 PM to take an additional 40 mg of Lasix which he has been doing on  most days.  He urinates until 10 PM.  He has not been taking the Lasix as soon as he wakes up because then he states he cannot do anything without only he continues to experience ankle swelling bilaterally left greater than right.  He has not been using support stockings.  At times he may notice a mild arrhythmia.  He denied presyncope or syncope or recurrent anginal symptoms.  During that evaluation, I recommended that he continue to use support stockings.  Due to his frequent PVCs noted on ECG I slightly titrated his metoprolol from 12.5 mg twice a day to 25 mg in the morning and 12.5 mg at night.    When I saw him in November 2020 he felt improved from his July 2020 evaluation  with titration of beta-blocker therapy and noticed resolution of his previous sense of palpitations.  His peripheral edema has improved with reinstitution of support stockings.  He denied any recurrent anginal symptoms.  He denied any significant shortness of breath.  I saw him in June 2021 at which time he admitted to vague chest pain which has been nitrate responsive.  His peripheral edema had improved with support stockings.  At that tim he denied any presyncope or syncope.  He has continued to be on DAPT with aspirin/Plavix.  He is on furosemide 40 mg, losartan 100 mg and metoprolol tartrate 25 mg in the morning and 12.5 mg in the evening.  He has been taking isosorbide 60 mg in the morning and 30 mg at night.  During that evaluation I recommended slight additional titration of isosorbide to 90 mg in the morning and 30 mg at night.  Apparently in September 2021 he developed a syncopal spell he was sitting in the chair.  His wife gave him a dose of nitroglycerin and he subsequently passed out.  Pressure on EMS arrival was 89/65, CBG 112.  He was given 40 of cc of IV fluid prior to ER arrival.  This was also the first day he had taken alfuzosin prescribed for his urination.  He had not taken his medicine again.  He was seen by Azalee Course, PA in follow-up and was stable.  I last saw him on Sep 27, 2020 when he came to the office with his son.  His son believes that the patient may have run out of his furosemide January.  The patient is unaware of.  They have also noticed some short-term memory loss and were concerned of whether or not he had a stroke.  He specifically denied any weakness or focal neurologic symptoms.  He has been on aspirin/Plavix for DAPT, atorvastatin 40 mg for hyperlipidemia, isosorbide 90 mg in the morning, 30 mg at night, losartan  100 mg in the morning, metoprolol tartrate 25 mg in the morning and 12.5 mg at night.  On his formulary it still says furosemide 40 mg daily, but the son believes he ran out of this.    Since his May 2022 evaluation, he went to the emergency room on January 04, 2021 after developing syncope while taking a hot shower.  When EMS arrived they stood the patient up and he became bradycardic, pale, diaphoretic and had another syncopal episode.  ECG showed first-degree AV block.  Laboratory revealed normal serial troponins, BMP, D-dimer and CTA was negative for PE or acute pulmonary issues. His electrolytes were normal.  CT of his head and neck were negative for acute injury.  Right upper quadrant ultrasound was negative.  He was treated with intravenous fluids.  Several days later he represented to the ED on January 07, 2021 with leg swelling was treated for cellulitis of the right leg with doxycycline 100 mg twice a day for 7 days.  He was seen on January 16, 2021 by Azalee Course, PA-C for preoperative clearance prior to hernia repair by [facility] surgery.  At that time he continued to have right lower extremity edema with significant resolution of his prior erythema.  He was diagnosed with plasma cell myeloma based on a bone marrow biopsy in August 2022 and is followed by Dr. Ellin Saba.  Skeletal survey was negative for lytic lesions.  At that time, he was having thoughts regarding his  hernia surgery and ultimately preferred to defer this.  I last saw him on March 28, 2021 at which time he felt well and denied chest pain.  On November 18 he had another presyncopal spell and had fallen.  He still noted some right ankle swelling but his lower extremity was significantly improved.  He denied chest pain, PND orthopnea.  He had undergone an echo Doppler study on February 08, 2019 which showed hyperdynamic LV function with EF 65 to 70%, mild MR, mild TR and aortic valve was felt to be tricuspid but there was no mention of sclerosis or stenosis.    I last saw him on April 03, 2022 with his son.  Since I previously saw him he was now undergoing weekly infusion therapy for his multiple myeloma followed by Dr. Ellin Saba.  When they had extended his infusions every 2 weeks his hemoglobin decreased and M spike increased leading to resumption of weekly dosing.  He is no longer taking daily furosemide and takes this only on an as-needed basis.  He continues to be on metoprolol tartrate 25 mg in the morning and 12.5 mg at night in addition to isosorbide 90 mg in the morning and 30 mg at night.  He is on pantoprazole for GERD.  He also is on dexamethasone weekly.  He is on atorvastatin for hyperlipidemia.   Dale [name] was hospitalized on January 29 and discharged on June 04, 2022 with mild altered mental status changes.  It was felt possibly multifactorial in part to electrolyte derangement.  On telemetry there were no significant abnormalities.  An MRI of his brain and CT did not reveal any stroke or other acute intracranial findings.  Carotid studies did not reveal any hemodynamically significant stenosis.  A limited echo showed EF 60 to 65% without wall motion abnormalities, unchanged from a prior echo in October 2023.  With his chronic anemia and significant thrombocytopenia it was recommended that DAPT be avoided.  Since his hospitalization, Dale [name] has continued to do  well.  He continues  to be followed by Dr. Ellin Saba for his infusion therapy.  Laboratory on Sep 11, 2022 showed a hemoglobin 7.8 hematocrit 24.1 with MCV 113.1.  White count was 3.5.  BUN and creatinine were 20/0.84.  Sodium was 132.  He denies any chest pain.  He admits to some ankle swelling.  He is on aspirin.  He has a prescription for furosemide for swelling.  He continues to be on isosorbide 90 mg in the morning and 30 mg at night and losartan 100 mg daily in addition to metoprolol 25 mg in the morning and 12.5 mg at night.  He is not having any anginal symptoms.  He is on atorvastatin 40 mg for hyperlipidemia and levothyroxine 75 mcg for hypothyroidism.  He has been also taking dexamethasone in addition to acyclovir.  He presents for evaluation.   Past Medical History:  Diagnosis Date   Allergic rhinitis    Anal fissure    Anginal pain (HCC)    Asthma    Cancer (HCC)    skin   Chest pain    CHF (congestive heart failure) (HCC)    Coronary heart disease    s/p stenting. cath in 01/2012 noncritical occlusion   Dyspnea    Dysrhythmia    1st degree heart block   GERD (gastroesophageal reflux disease)    Glaucoma    Hiatal hernia    Hyperlipidemia    Hypertension    Hypothyroidism    Idiopathic thrombocytopenic purpura (HCC) 2002   Macular degeneration    Multiple myeloma (HCC) 01/15/2021   Nephrolithiasis    PUD (peptic ulcer disease)    remote   Sarcoidosis    pulmonary   Schatzki's ring     Past Surgical History:  Procedure Laterality Date   cardiac stents     COLONOSCOPY  10/30/2006   Normal rectum, sigmoid diverticula.Remainder of colonic mucosa appeared normal.   CORONARY ANGIOPLASTY WITH STENT PLACEMENT     about 10 years ago per pt (around 2007)   CYSTOSCOPY WITH RETROGRADE PYELOGRAM, URETEROSCOPY AND STENT PLACEMENT Left 06/16/2017   Procedure: CYSTOSCOPY WITH RETROGRADE PYELOGRAM, URETEROSCOPY,STONE EXTRACTION  AND STENT PLACEMENT;  Surgeon: Marcine Matar, MD;  Location: [facility];  Service: Urology;  Laterality: Left;   ESOPHAGOGASTRODUODENOSCOPY  06/19/2004   Two esophageal rings and esophageal web as described above.  All of these were disrupted by passing 56-French Elease Hashimoto dilator/ Candida esophagitis,which appears to be incidental given history of   antibiotic use, but nevertheless will be treated.   ESOPHAGOGASTRODUODENOSCOPY  10/30/2006   Distal tandem esophageal ring status post dilation disruption as  described above.  Otherwise normal esophagus/  Small hiatal hernia otherwise normal stomach, D1 and D2   ESOPHAGOGASTRODUODENOSCOPY N/A 03/22/2015   Dr.Rourk- noncritical schatzki's ring and hiatal hernia-o/w normal EGD.    ESOPHAGOGASTRODUODENOSCOPY (EGD) WITH ESOPHAGEAL DILATION  03/04/2012   RMR- schatzki's ring, hiatal hernia, polypoid gastric mucosa, bx= minimally active gastritis.   HOLMIUM LASER APPLICATION Left 06/16/2017   Procedure: HOLMIUM LASER APPLICATION;  Surgeon: Marcine Matar, MD;  Location: [facility] ORS;  Service: Urology;  Laterality: Left;   IR CV LINE INJECTION  12/27/2021   IR GENERIC HISTORICAL  03/06/2016   IR RADIOLOGIST EVAL & MGMT 03/06/2016 Irish Lack, MD [facility] INTERV RAD   IR GENERIC HISTORICAL  06/18/2016   IR RADIOLOGIST EVAL & MGMT 06/18/2016 Irish Lack, MD [facility] INTERV RAD   IR IMAGING GUIDED PORT INSERTION  12/18/2021   IR IMAGING GUIDED PORT  INSERTION  01/15/2022   IR RADIOLOGIST EVAL & MGMT  10/01/2016   IR RADIOLOGIST EVAL & MGMT  10/15/2017   IR RADIOLOGIST EVAL & MGMT  12/24/2018   IR RADIOLOGIST EVAL & MGMT  01/04/2020   IR RADIOLOGIST EVAL & MGMT  06/20/2021   IR REMOVAL TUN ACCESS W/ PORT W/O FL MOD SED  01/15/2022   LEFT HEART CATH N/A 02/02/2012   Procedure: LEFT HEART CATH;  Surgeon: Runell Gess, MD;  Location: [facility] CATH LAB;  Service: Cardiovascular;  Laterality: N/A;   MEDIASTINOSCOPY     for dx sarcoid   RADIOLOGY WITH ANESTHESIA Left 05/17/2016   Procedure: left renal ablation;  Surgeon: Irish Lack, MD;   Location: [facility] OR[facility];  Service: Radiology;  Laterality: Left;    Allergies  Allergen Reactions   Azithromycin Other (See Comments)    Sore mouth and fever blisters around mouth, sores in nose area as well   Doxazosin Shortness Of Breath   Atenolol Other (See Comments)    UNKNOWN REACTION   Hydrocodone Nausea And Vomiting   Levofloxacin Other (See Comments)    Caused stomach problems.   Morphine Other (See Comments)    "made me crazy"   Penicillins Nausea And Vomiting and Other (See Comments)    Has patient had a PCN reaction causing immediate rash, facial/tongue/throat swelling, SOB or lightheadedness with hypotension: No Has patient had a PCN reaction causing severe rash involving mucus membranes or skin necrosis: No Has patient had a PCN reaction that required hospitalization No Has patient had a PCN reaction occurring within the last 10 years: No If all of the above answers are "NO", then may proceed with Cephalosporin use.    Sulfonamide Derivatives Nausea And Vomiting    Current Outpatient Medications  Medication Sig Dispense Refill   acetaminophen (TYLENOL) 325 MG tablet Take 2 tablets (650 mg total) by mouth every 6 (six) hours as needed for mild pain (or Fever >/= 101).     acyclovir (ZOVIRAX) 400 MG tablet TAKE 1 TABLET BY MOUTH TWICE  DAILY 180 tablet 0   Ascorbic Acid (VITAMIN C PO) Take 500 mg by mouth every evening.     aspirin EC 81 MG tablet Take 1 tablet (81 mg total) by mouth daily with breakfast. For 30 days Only (Patient taking differently: Take 81 mg by mouth daily with breakfast. Every Monday, Wednesday, and Friday) 30 tablet 0   atorvastatin (LIPITOR) 40 MG tablet TAKE 1 TABLET BY MOUTH IN THE  EVENING 90 tablet 1   Bortezomib (VELCADE IJ) Inject as directed once a week.     brimonidine (ALPHAGAN) 0.2 % ophthalmic solution Place 1 drop into the left eye 2 (two) times daily.     cyanocobalamin 1000 MCG tablet Take 1 tablet (1,000 mcg total) by mouth daily. 30 tablet  3   dexamethasone (DECADRON) 2 MG tablet TAKE 5 TABLETS BY MOUTH ONCE  WEEKLY 60 tablet 0   dorzolamide-timolol (COSOPT) 22.3-6.8 MG/ML ophthalmic solution Place 1 drop into the left eye 2 (two) times daily.     feeding supplement (ENSURE ENLIVE / ENSURE PLUS) LIQD Take 237 mLs by mouth 3 (three) times daily between meals.     furosemide (LASIX) 20 MG tablet TAKE 2 TABLETS BY MOUTH DAILY, IF YOU NOTICE ANY SWELLING. 90 tablet 1   isosorbide mononitrate (IMDUR) 60 MG 24 hr tablet TAKE 1 AND 1/2 TABLETS BY MOUTH  IN THE MORNING AND ONE-HALF  TABLET BY MOUTH AT NIGHT 180 tablet  2   levothyroxine (SYNTHROID) 75 MCG tablet Take 75 mcg by mouth daily before breakfast.     lidocaine-prilocaine (EMLA) cream Apply 1 Application topically as needed. 30 g 1   losartan (COZAAR) 100 MG tablet TAKE 1 TABLET BY MOUTH DAILY (Patient taking differently: Take 100 mg by mouth daily.) 90 tablet 3   metoprolol tartrate (LOPRESSOR) 25 MG tablet TAKE 1 TABLET BY MOUTH IN THE  MORNING AND ONE-HALF TABLET BY  MOUTH IN THE EVENING 135 tablet 3   Multiple Vitamins-Minerals (PRESERVISION/LUTEIN) CAPS Take 1 capsule by mouth 2 (two) times daily.     nitroGLYCERIN (NITROSTAT) 0.4 MG SL tablet Place 1 tablet (0.4 mg total) under the tongue every 5 (five) minutes as needed. For chest pain 25 tablet 2   pantoprazole (PROTONIX) 40 MG tablet TAKE 1 TABLET BY MOUTH DAILY  BEFORE BREAKFAST (Patient taking differently: Take 40 mg by mouth daily.) 90 tablet 3   potassium chloride SA (KLOR-CON M) 20 MEQ tablet Take 20 mEq by mouth daily.     prochlorperazine (COMPAZINE) 10 MG tablet Take 1 tablet (10 mg total) by mouth every 6 (six) hours as needed for nausea or vomiting. 30 tablet 3   ROCKLATAN 0.02-0.005 % SOLN Place 1 drop into the left eye at bedtime.     triamcinolone (KENALOG) 0.1 % cream Apply 1 application. topically 2 (two) times daily as needed (for irritation).     vitamin E 200 UNIT capsule Take 200 Units by mouth every  evening.     No current facility-administered medications for this visit.   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    Employer: RETIRED  Tobacco Use   Smoking status: Former    Packs/day: 0.10    Years: 2.00    Additional pack years: 0.00    Total pack years: 0.20    Types: Cigarettes, Cigars    Quit date: 05/06/1970    Years since quitting: 52.4   Smokeless tobacco: Never  Vaping Use   Vaping Use: Never used  Substance and Sexual Activity   Alcohol use: No    Alcohol/week: 0.0 standard drinks of alcohol   Drug use: No   Sexual activity: Never  Other Topics Concern   Not on file  Social History Narrative   Not on file   Social Determinants of Health   Financial Resource Strain: Not on file  Food Insecurity: No Food Insecurity (06/04/2022)   Hunger Vital Sign    Worried About Running Out of Food in the Last Year: Never true    Ran Out of Food in the Last Year: Never true  Transportation Needs: No Transportation Needs (06/04/2022)   PRAPARE - Administrator, Civil Service (Medical): No    Lack of Transportation (Non-Medical): No  Physical Activity: Not on file  Stress: Not on file  Social Connections: Not on file  Intimate Partner Violence: Not At Risk (06/04/2022)   Humiliation, Afraid, Rape, and Kick questionnaire    Fear of Current or Ex-Partner: No    Emotionally Abused: No    Physically Abused: No    Sexually Abused: No   Additional social  history is notable that he is married and lives with his wife. He quit smoking over 40 years ago. Has one child. He remains active.  Family History  Problem Relation Age of Onset   Heart disease Father        deceased age 65   Stroke Mother    Alzheimer's disease Mother    Heart attack Brother        deceased age 68   Cancer Other        niece   Colon cancer Neg Hx    ROS General: Negative; No fevers, chills, or night sweats; Positive for weight loss HEENT: Positive for recent sinusitis.  No changes in vision., difficulty swallowing Pulmonary: Negative; No cough, wheezing, shortness of breath, hemoptysis Cardiovascular: See history of present illness GI: Negative; No nausea, vomiting, diarrhea, or abdominal pain GU: History of renal cell CA status post cryoablation; status post renal stone removal Musculoskeletal: Negative; no myalgias, joint pain, or weakness Hematologic/Oncology: Recently diagnosed plasma cell myeloma Endocrine: Positive for hypothyroidism; no diabetes Neuro: Negative; no changes in balance, headaches Skin: Negative; No rashes or skin lesions Psychiatric: Negative; No behavioral problems, depression Sleep: Negative; No snoring, daytime sleepiness, hypersomnolence, bruxism, restless legs, hypnogognic hallucinations, no cataplexy Other comprehensive 14 point system review is negative.   PE BP (!) 162/66   Pulse 63   Ht 5\' 8"  (1.727 m)   Wt 136 lb 3.2 oz (61.8 kg)   SpO2 98%   BMI 20.71 kg/m    Repeat blood pressure by me was 142/62  Wt Readings from Last 3 Encounters:  09/20/22 136 lb 3.2 oz (61.8 kg)  09/18/22 134 lb 6.4 oz (61 kg)  09/11/22 132 lb (59.9 kg)   General: Alert, oriented, no distress.  Skin: normal turgor, no rashes, warm and dry HEENT: Normocephalic, atraumatic. Pupils equal round and reactive to light; sclera anicteric; extraocular muscles intact; Nose without nasal septal hypertrophy Mouth/Parynx benign; Mallinpatti scale  2/3 Neck: No JVD, no carotid bruits; normal carotid upstroke Lungs: clear to ausculatation and percussion; no wheezing or rales Chest wall: without tenderness to palpitation Heart: PMI not displaced, RRR, s1 s2 normal, 1/6 systolic murmur, no diastolic murmur, no rubs, gallops, thrills, or heaves Abdomen: soft, nontender; no hepatosplenomehaly, BS+; abdominal aorta nontender and not dilated by palpation. Back: no CVA tenderness Pulses 2+ Musculoskeletal: full range of motion, normal strength, no joint deformities Extremities: Ankle swelling left greater than right; no clubbing cyanosis, Homan's sign negative  Neurologic: grossly nonfocal; Cranial nerves grossly wnl Psychologic: Normal mood and affect    Sep 20, 2022 ECG (independently read by me): Sinus rhythm at 60; 1st degree AV block; PR 222 msec   April 03, 2022 ECG (independently read by me):  Possible low arial rhythm at 62; No STT changes  March 28, 2021 ECG (independently read by me):  NSR at 73; ; QTc 416 msec; no ectopy  Sep 27, 2020 ECG (independently read by me): Normal sinus rhythm at 70, borderline first-degree AV block with a PR interval of 208 ms.  No ectopy.  June 2021 ECG (independently read by me): Normal sinus rhythm at 60 bpm, first-degree  AV block.  PR nterval to 244 ms  March 18, 2019 ECG (independently read by me): Normal sinus rhythm at 62 bpm.  First-degree AV block with a parable 244 ms.  1 isolated PVC.  No ST segment change  July 2020 ECG (independently read by me):Sinus rhythm at 68 bpm with first-degree AV block ( PR interval 236 msec) and frequent unifocal PVCs  Sep 09, 2018 ECG (independently read by me): Sinus bradycardia with first-degree AV block with ventricular rate of 59 bpm.  PR interval 244 ms.  June 02, 2018 ECG (independently read by me): Sinus rhythm at 62 bpm with first-degree block with apparent of what to 40 ms.  No significant ST-T changes.  July 24, 2017 ECG (independently  read by me): normal sinus rhythm at 60 bpm with first-degree AV block.  PR interval 236 ms.  QTc interval normal.  No ectopy.  October 2018 ECG (independently read by me): Sinus bradycardia 50 bpm.  First degree AV block with a PR interval at 224 ms.  No syncope ST changes.  September 2017 ECG (independently read by me): Sinus bradycardia 59 bpm.  First-degree AV block with PR interval 240 ms.  No significant ST-T changes.  November 2016 ECG (independently read by me): Sinus bradycardia at 55 beats per minute with first-degree AV block.  PR interval 246 ms.  No significant ST-T changes.  May 2016 ECG (independently read by me): Sinus rhythm with first-degree heart block.  PR interval 226.  No ST segment changes.  February 2016 ECG (independently read by me): Sinus rhythm at 65 bpm.  Mild first-degree AV block with a PR interval 228 ms.  No significant ST segment changes.  December 2015 ECG (independently read by me): Sinus bradycardia 57 bpm.  Borderline first-degree AV block with a PR interval 216 daily.  Seconds.  QTc interval is normal at 373 ms.  June 2015 ECG (independently read by me): Sinus bradycardia 54 beats per minute.  First degree block with a PR interval of 232 ms.  No significant ST-T changes.  Prior ECG: Sinus bradycardia 54 beats per minute. PR interval 206 ms. QTc interval normal  LABS:    Latest Ref Rng & Units 09/18/2022    7:49 AM 09/11/2022    7:45 AM 09/04/2022    8:10 AM  BMP  Glucose 70 - 99 mg/dL 95  94  91   BUN 8 - 23 mg/dL 15  20  18    Creatinine 0.61 - 1.24 mg/dL 6.96  2.95  2.84   Sodium 135 - 145 mmol/L 132  132  130   Potassium 3.5 - 5.1 mmol/L 3.7  3.6  3.7   Chloride 98 - 111 mmol/L 102  103  101   CO2 22 - 32 mmol/L 25  24  24    Calcium 8.9 - 10.3 mg/dL 8.4  8.5  8.6       Latest Ref Rng & Units 09/18/2022    7:49 AM 09/11/2022    7:45 AM 09/04/2022    8:10 AM  Hepatic Function  Total Protein 6.5 - 8.1 g/dL 7.6  8.0  8.3   Albumin 3.5 - 5.0 g/dL 2.7   2.7  2.7   AST 15 - 41 U/L 18  20  19    ALT 0 - 44 U/L 20  23  22    Alk Phosphatase 38 - 126 U/L 54  56  55   Total Bilirubin 0.3 - 1.2 mg/dL 0.7  0.6  0.7       Latest Ref Rng & Units 09/18/2022    7:49 AM 09/11/2022    7:45 AM 09/04/2022    8:10 AM  CBC  WBC 4.0 - 10.5 K/uL 3.6  3.5  3.8   Hemoglobin 13.0 - 17.0 g/dL 8.7  7.8  8.1   Hematocrit 39.0 - 52.0 % 26.7  24.1  24.2   Platelets 150 - 400 K/uL 90  116  120    Lab Results  Component Value Date   MCV 101.1 (H) 09/18/2022   MCV 113.1 (H) 09/11/2022   MCV 108.5 (H) 09/04/2022   Lab Results  Component Value Date   TSH 3.010 10/26/2019   Lab Results  Component Value Date   HGBA1C 6.3 (H) 06/03/2022     Lipid Panel     Component Value Date/Time   CHOL 96 06/04/2022 0526   CHOL 123 10/26/2019 1013   TRIG 79 06/04/2022 0526   HDL 32 (L) 06/04/2022 0526   HDL 41 10/26/2019 1013   CHOLHDL 3.0 06/04/2022 0526   VLDL 16 06/04/2022 0526   LDLCALC 48 06/04/2022 0526   LDLCALC 62 10/26/2019 1013   RADIOLOGY:  ECHO: 06/04/2022  1. Limited Echo to evaluate for LVEF   2. Left ventricular ejection fraction, by estimation, is 60 to 65%. The  left ventricle has normal function. The left ventricle has no regional  wall motion abnormalities. Left ventricular diastolic function could not  be evaluated.   Comparison(s): No significant change from prior study.    CAROTID STUDY: 06/04/2022 IMPRESSION: 1. Bilateral carotid atherosclerosis. Negative for significant stenosis by ultrasound. Degree of narrowing less than 50% bilaterally by ultrasound criteria. 2. Patent antegrade vertebral flow bilaterally.    IMPRESSION:  1. Essential hypertension   2. Coronary artery disease involving native coronary artery of native heart without angina pectoris   3. Hypothyroidism, unspecified type   4. Multiple myeloma, remission status unspecified (HCC)   5. Hyperlipidemia LDL goal <70   6. Left ankle swelling     ASSESSMENT AND  PLAN: Dale [name] is a 89 years old gentleman who has known CAD and underwent initial intervention to his RCA in 2000 and subsequent intervention in 2009. At last catheterization in 2013 his RCA stents were patent and he had mild 30% LAD narrowing.  He has had leg edema issues which resulted in changing his HCTZ to furosemide in the past.  He had issues with edema, and has had presyncope/syncope as well as some previous chest pain which had responded to nitrates.  Since his prior visits, his leg edema has completely resolved and he is no longer taking furosemide daily but takes this on an as-needed basis.  Previously, I had reduced his isosorbide with an episode of presyncope.  An echo Doppler study from February 07, 2022  showed normal LV size and function.  Atrium are normal.  There was mild mitral regurgitation, mild tricuspid regurgitation.  His aortic valve was tricuspid and there was no mention of aortic stenosis or sclerosis.  He had recently been admitted with altered mental status.  An echo Doppler study was essentially unchanged from previously.  He had mild carotid atherosclerosis on carotid duplex imaging without significant stenosis.  MRI of his brain and CT of his head did not reveal any acute intracranial findings.  Troponins were negative.  He is followed by Dr. Ellin Saba for his multiple myeloma and has continued to undergo weekly infusions.  Recent laboratory from Sep 11, 2022 were reviewed which showed hemoglobin 7.8/hematocrit 24.1, MCV 113.  He does have ankle edema left greater than right.  He has a prescription for furosemide for swelling.  I also have suggested support stockings.  He will be following up with Dr. Ellin Saba as well as Dr. Sherwood Gambler.  I will see him in 9 months for follow-up evaluation.    Lennette Bihari, MD, [facility]  09/22/2022 11:14 AM

## 2022-09-22 ENCOUNTER — Encounter: Payer: Self-pay | Admitting: Cardiovascular Disease

## 2022-09-24 ENCOUNTER — Inpatient Hospital Stay: Payer: Medicare Other

## 2022-09-24 VITALS — BP 153/68 | HR 65 | Temp 97.1°F | Resp 20

## 2022-09-24 DIAGNOSIS — D630 Anemia in neoplastic disease: Secondary | ICD-10-CM | POA: Diagnosis not present

## 2022-09-24 DIAGNOSIS — C9000 Multiple myeloma not having achieved remission: Secondary | ICD-10-CM | POA: Diagnosis not present

## 2022-09-24 DIAGNOSIS — Z5112 Encounter for antineoplastic immunotherapy: Secondary | ICD-10-CM | POA: Diagnosis not present

## 2022-09-24 DIAGNOSIS — Z95828 Presence of other vascular implants and grafts: Secondary | ICD-10-CM

## 2022-09-24 DIAGNOSIS — D693 Immune thrombocytopenic purpura: Secondary | ICD-10-CM | POA: Diagnosis not present

## 2022-09-24 LAB — CBC
HCT: 27.6 % — ABNORMAL LOW (ref 39.0–52.0)
Hemoglobin: 8.9 g/dL — ABNORMAL LOW (ref 13.0–17.0)
MCH: 33.2 pg (ref 26.0–34.0)
MCHC: 32.2 g/dL (ref 30.0–36.0)
MCV: 103 fL — ABNORMAL HIGH (ref 80.0–100.0)
Platelets: 83 10*3/uL — ABNORMAL LOW (ref 150–400)
RBC: 2.68 MIL/uL — ABNORMAL LOW (ref 4.22–5.81)
WBC: 3.4 10*3/uL — ABNORMAL LOW (ref 4.0–10.5)
nRBC: 0 % (ref 0.0–0.2)

## 2022-09-24 LAB — SAMPLE TO BLOOD BANK

## 2022-09-24 MED ORDER — SODIUM CHLORIDE 0.9% FLUSH
10.0000 mL | Freq: Once | INTRAVENOUS | Status: AC
  Administered 2022-09-24: 10 mL via INTRAVENOUS

## 2022-09-24 MED ORDER — HEPARIN SOD (PORK) LOCK FLUSH 100 UNIT/ML IV SOLN
500.0000 [IU] | Freq: Once | INTRAVENOUS | Status: AC
  Administered 2022-09-24: 500 [IU] via INTRAVENOUS

## 2022-09-24 NOTE — Progress Notes (Signed)
Port flushed with good blood return noted. No bruising or swelling at site. Bandaid applied and patient discharged in satisfactory condition. VVS stable with no signs or symptoms of distressed noted. 

## 2022-09-24 NOTE — Patient Instructions (Signed)
[facility]  Discharge Instructions: Thank you for choosing [facility] to provide your oncology and hematology care.  If you have a lab appointment with the Cancer Center - please note that after April 8th, 2024, all labs will be drawn in the cancer center.  You do not have to check in or register with the main entrance as you have in the past but will complete your check-in in the cancer center.  Wear comfortable clothing and clothing appropriate for easy access to any Portacath or PICC line.   We strive to give you quality time with your provider. You may need to reschedule your appointment if you arrive late (15 or more minutes).  Arriving late affects you and other patients whose appointments are after yours.  Also, if you miss three or more appointments without notifying the office, you may be dismissed from the clinic at the provider's discretion.      For prescription refill requests, have your pharmacy contact our office and allow 72 hours for refills to be completed.    Today you received the following port flush lab, return as scheduled.   To help prevent nausea and vomiting after your treatment, we encourage you to take your nausea medication as directed.  BELOW ARE SYMPTOMS THAT SHOULD BE REPORTED IMMEDIATELY: *FEVER GREATER THAN 100.4 F (38 C) OR HIGHER *CHILLS OR SWEATING *NAUSEA AND VOMITING THAT IS NOT CONTROLLED WITH YOUR NAUSEA MEDICATION *UNUSUAL SHORTNESS OF BREATH *UNUSUAL BRUISING OR BLEEDING *URINARY PROBLEMS (pain or burning when urinating, or frequent urination) *BOWEL PROBLEMS (unusual diarrhea, constipation, pain near the anus) TENDERNESS IN MOUTH AND THROAT WITH OR WITHOUT PRESENCE OF ULCERS (sore throat, sores in mouth, or a toothache) UNUSUAL RASH, SWELLING OR PAIN  UNUSUAL VAGINAL DISCHARGE OR ITCHING   Items with * indicate a potential emergency and should be followed up as soon as possible or go to the Emergency  Department if any problems should occur.  Please show the CHEMOTHERAPY ALERT CARD or IMMUNOTHERAPY ALERT CARD at check-in to the Emergency Department and triage nurse.  Should you have questions after your visit or need to cancel or reschedule your appointment, please contact [facility] [phone]  and follow the prompts.  Office hours are 8:00 a.m. to 4:30 p.m. Monday - Friday. Please note that voicemails left after 4:00 p.m. may not be returned until the following business day.  We are closed weekends and major holidays. You have access to a nurse at all times for urgent questions. Please call the main number to the clinic [phone] and follow the prompts.  For any non-urgent questions, you may also contact your provider using MyChart. We now offer e-Visits for anyone 70 and older to request care online for non-urgent symptoms. For details visit [email or web address].   Also download the MyChart app! Go to the app store, search "MyChart", open the app, select [facility], and log in with your MyChart username and password.

## 2022-09-25 ENCOUNTER — Inpatient Hospital Stay: Payer: Medicare Other

## 2022-09-25 LAB — KAPPA/LAMBDA LIGHT CHAINS
Kappa free light chain: 3.5 mg/L (ref 3.3–19.4)
Kappa, lambda light chain ratio: 0.01 — ABNORMAL LOW (ref 0.26–1.65)
Lambda free light chains: 414.1 mg/L — ABNORMAL HIGH (ref 5.7–26.3)

## 2022-09-27 LAB — PROTEIN ELECTROPHORESIS, SERUM
A/G Ratio: 0.7 (ref 0.7–1.7)
Albumin ELP: 3.1 g/dL (ref 2.9–4.4)
Alpha-1-Globulin: 0.2 g/dL (ref 0.0–0.4)
Alpha-2-Globulin: 0.8 g/dL (ref 0.4–1.0)
Beta Globulin: 0.7 g/dL (ref 0.7–1.3)
Gamma Globulin: 2.7 g/dL — ABNORMAL HIGH (ref 0.4–1.8)
Globulin, Total: 4.4 g/dL — ABNORMAL HIGH (ref 2.2–3.9)
M-Spike, %: 2.4 g/dL — ABNORMAL HIGH
Total Protein ELP: 7.5 g/dL (ref 6.0–8.5)

## 2022-10-01 IMAGING — DX DG ABDOMEN ACUTE W/ 1V CHEST
3 series · 3 of 3 positions shown · non-contrast
Comparison: Chest 01/04/2021

CLINICAL DATA: Nausea and vomiting

EXAM:
DG ABDOMEN ACUTE WITH 1 VIEW CHEST

[abdomen erect]
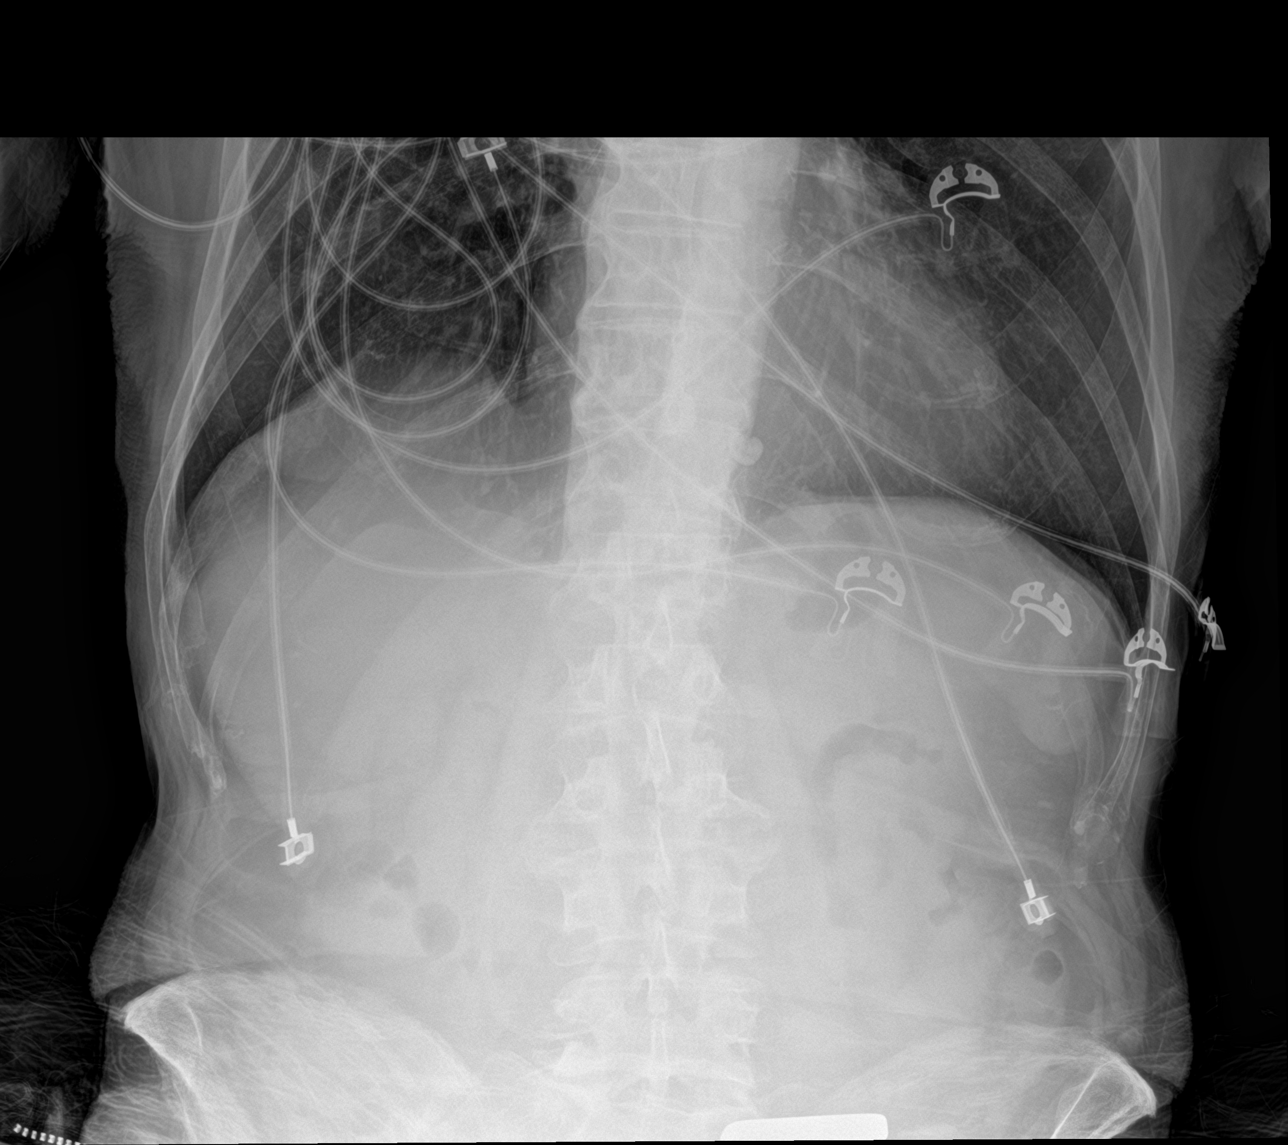

[abdomen supine]
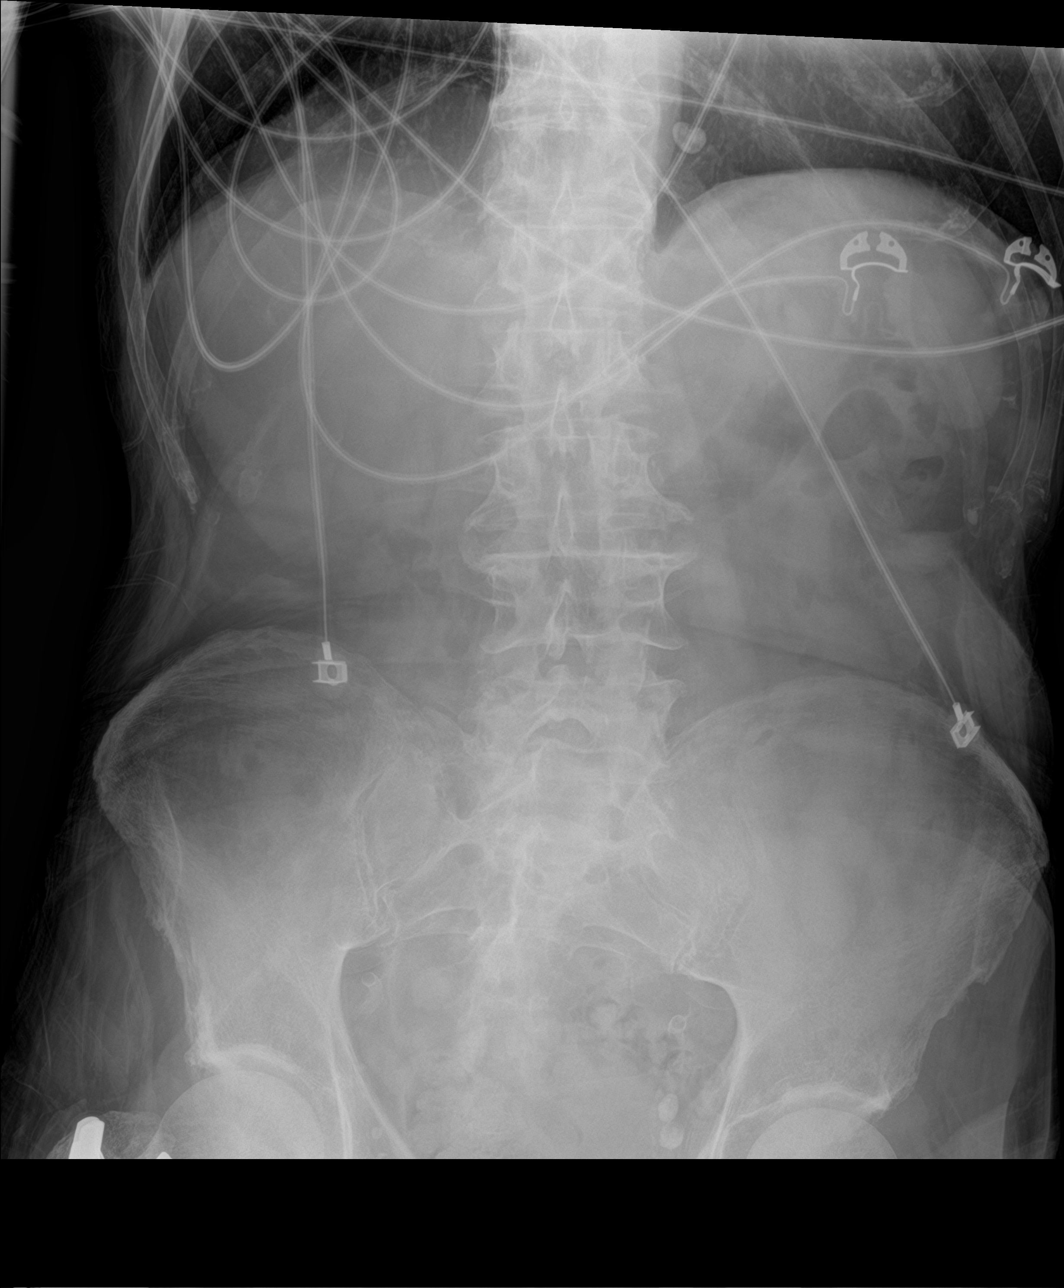

[chest ap]
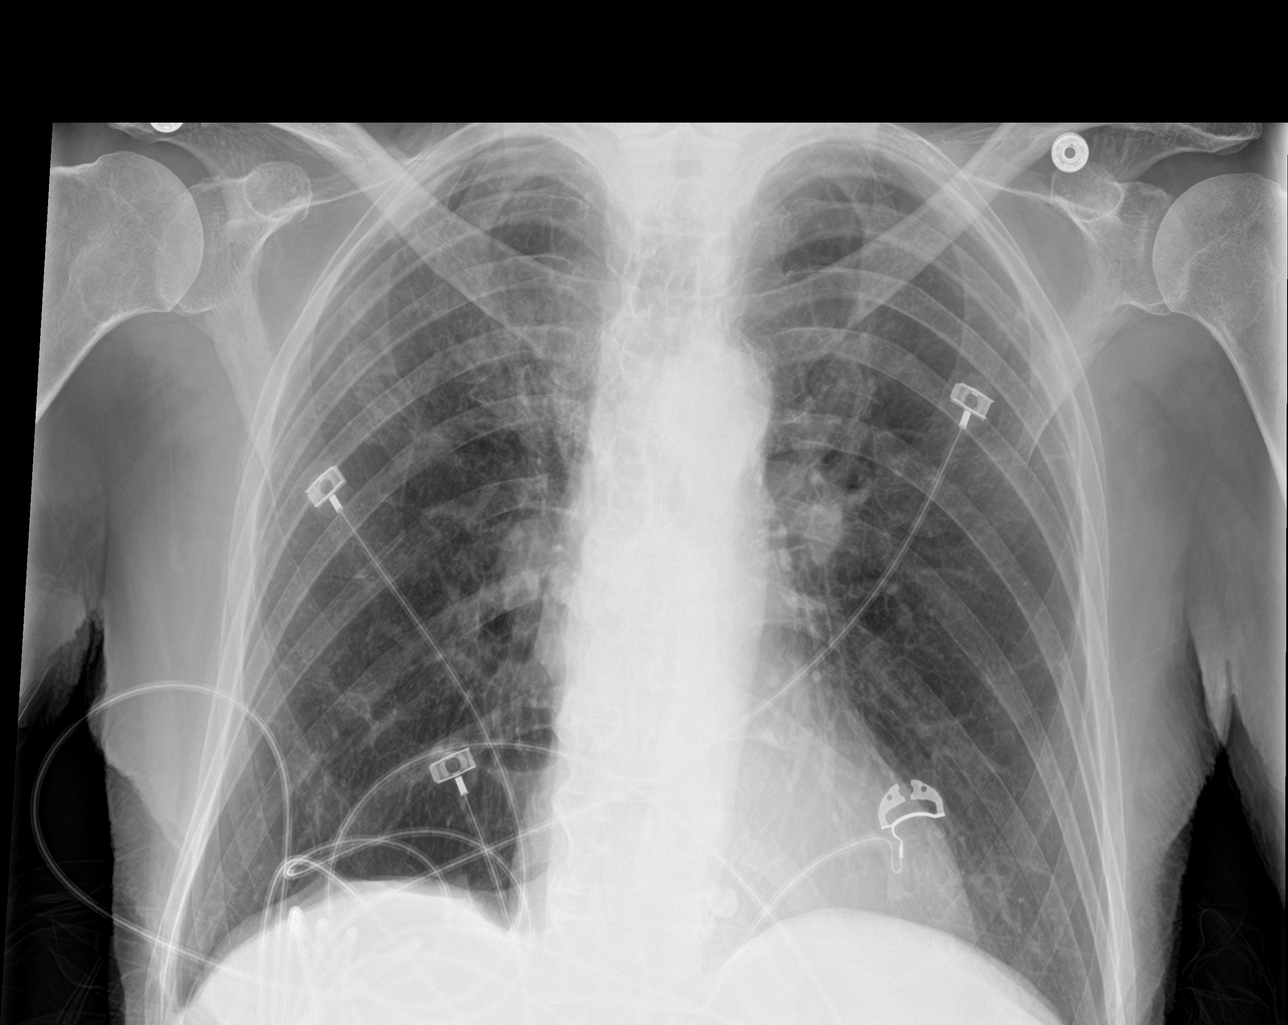

[3 of 3 positions shown; findings below may reference images not displayed]

FINDINGS: Cardiac and mediastinal contours normal. Lungs are clear without
infiltrate or effusion.

Normal bowel gas pattern. No obstruction or free air. Negative for
urinary tract calculi.
IMPRESSION: Negative abdominal radiographs.  No acute cardiopulmonary disease.

## 2022-10-01 NOTE — Progress Notes (Signed)
[facility] [address] [place] [address]    Clinic Day:  10/02/2022  Referring physician: Doreatha Massed, MD  Patient Care Team: Pcp, No as PCP - General Lennette Bihari, MD as PCP - Cardiology (Cardiology) Lennette Bihari, MD as Consulting Physician (Cardiology) Jena Gauss Gerrit Friends, MD as Consulting Physician (Gastroenterology) Doreatha Massed, MD as Medical Oncologist (Oncology)   ASSESSMENT & PLAN:   Assessment: 1.  IgG lambda plasma cell myeloma: - Work-up for macrocytic anemia on 12/01/2020 showed M spike of 2.9 g.  Immunofixation IgG lambda. - Lambda light chains elevated at 284.  Light chain ratio was 0.04.  LDH was 163.  Creatinine was 0.9 and calcium 8.9. - Bone marrow biopsy on 12/18/2020-hypercellular marrow for age with 48% atypical plasma cells with lambda light chain restriction. - Chromosome analysis was normal. 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 - Revlimid 5 mg, 2 weeks on/1 week off started on 01/20/2021.  Dexamethasone weekly 10 mg added on 02/07/2021.  Revlimid is discontinued around 06/15/2021 due to poor tolerance and ineffectiveness at low-dose.  Thrombocytopenia and anemia. - Velcade weekly on days 1, 8, 15 every 21 days along with dexamethasone 10 mg weekly started on 06/25/2021. 2.  Macrocytic anemia: - CBC on 12/01/2020 with hemoglobin 8.5 and MCV of 117. - Denies any bleeding per rectum or melena.   3.  Social/family history: - Lives at home with his wife.  He does all ADLs and IADLs.  He even does yard work. - He worked at [employer] for 35 years.  Denies any chemical exposure.  Non-smoker. - No family history of malignancies.     Plan: 1.  IgG lambda plasma cell myeloma, high risk: - He is tolerating weekly Velcade and Darzalex very well.  He has on and off diarrhea and takes Imodium as needed.  No tingling or numbness reported.  Legs  are wobbly since he had pneumonia 2 years ago which is stable. - Reviewed myeloma panel from 09/24/2022.  M spike improved to 2.4.  Lambda light chains improved 414.  Ratio 0.01. - Labs today: White count and platelet count normal.  Hemoglobin 8.5.  LFTs and creatinine normal. - He will proceed with Velcade and Darzalex cycle 8, 3 weeks on/1 week off. - RTC 5 weeks with repeat myeloma panel.  2.  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  Will check CBC weekly and transfuse if hemoglobin less than 8.  3.  Hypophosphatemia: - Phosphate supplements discontinued due to diarrhea.  Phosphate level is normal today.  4.  Right ankle swelling: - He has 2+ ankle swelling.  Continue Lasix as needed.  5.  ID prophylaxis: - Continue acyclovir twice daily.  Continue aspirin 81 mg 3 times weekly.    Orders Placed This Encounter  Procedures   Comprehensive metabolic panel    Standing Status:   Future    Standing Expiration Date:   10/09/2023   Magnesium    Standing Status:   Future    Standing Expiration Date:   10/09/2023   CBC with Differential    Standing Status:   Future    Standing Expiration Date:   10/09/2023   Comprehensive metabolic panel    Standing Status:   Future    Standing Expiration Date:   10/16/2023   Magnesium    Standing Status:   Future  Standing Expiration Date:   10/16/2023   CBC with Differential    Standing Status:   Future    Standing Expiration Date:   10/16/2023   Comprehensive metabolic panel    Standing Status:   Future    Standing Expiration Date:   10/30/2023   Magnesium    Standing Status:   Future    Standing Expiration Date:   10/30/2023   CBC with Differential    Standing Status:   Future    Standing Expiration Date:   10/30/2023   Comprehensive metabolic panel    Standing Status:   Future    Standing Expiration Date:   11/06/2023   Magnesium    Standing Status:   Future    Standing Expiration Date:   11/06/2023   CBC with  Differential    Standing Status:   Future    Standing Expiration Date:   11/06/2023   Comprehensive metabolic panel    Standing Status:   Future    Standing Expiration Date:   11/13/2023   Magnesium    Standing Status:   Future    Standing Expiration Date:   11/13/2023   CBC with Differential    Standing Status:   Future    Standing Expiration Date:   11/13/2023   Kappa/lambda light chains    Standing Status:   Future    Standing Expiration Date:   10/02/2023   Protein electrophoresis, serum    Standing Status:   Future    Standing Expiration Date:   10/02/2023      I,Katie Daubenspeck,acting as a scribe for Doreatha Massed, MD.,have documented all relevant documentation on the behalf of Doreatha Massed, MD,as directed by  Doreatha Massed, MD while in the presence of Doreatha Massed, MD.   I, Doreatha Massed MD, have reviewed the above documentation for accuracy and completeness, and I agree with the above.   Doreatha Massed, MD   5/29/20246:13 PM  CHIEF COMPLAINT:   Diagnosis: multiple myeloma and anemia    Cancer Staging  No matching staging information was found for the patient.   Prior Therapy: none  Current Therapy:  Weekly Velcade and daratumumab    HISTORY OF PRESENT ILLNESS:   Oncology History  Multiple myeloma (HCC)  01/15/2021 Initial Diagnosis   Multiple myeloma (HCC)   06/25/2021 - 12/06/2021 Chemotherapy   Patient is on Treatment Plan : MYELOMA NON-TRANSPLANT CANDIDATES VRd weekly q21d     12/20/2021 -  Chemotherapy   Patient is on Treatment Plan : MYELOMA Daratumumab IV q28d        INTERVAL HISTORY:   Dale [name] is a 89 y.o. male presenting to clinic today for follow up of multiple myeloma and anemia. He was last seen by me on 09/04/22.  Today, he states that he is doing well overall. His appetite level is at 100%. His energy level is at 25%.  PAST MEDICAL HISTORY:   Past Medical History: Past Medical History:  Diagnosis Date    Allergic rhinitis    Anal fissure    Anginal pain (HCC)    Asthma    Cancer (HCC)    skin   Chest pain    CHF (congestive heart failure) (HCC)    Coronary heart disease    s/p stenting. cath in 01/2012 noncritical occlusion   Dyspnea    Dysrhythmia    1st degree heart block   GERD (gastroesophageal reflux disease)    Glaucoma    Hiatal hernia    Hyperlipidemia  Hypertension    Hypothyroidism    Idiopathic thrombocytopenic purpura (HCC) 2002   Macular degeneration    Multiple myeloma (HCC) 01/15/2021   Nephrolithiasis    PUD (peptic ulcer disease)    remote   Sarcoidosis    pulmonary   Schatzki's ring     Surgical History: Past Surgical History:  Procedure Laterality Date   cardiac stents     COLONOSCOPY  10/30/2006   Normal rectum, sigmoid diverticula.Remainder of colonic mucosa appeared normal.   CORONARY ANGIOPLASTY WITH STENT PLACEMENT     about 10 years ago per pt (around 2007)   CYSTOSCOPY WITH RETROGRADE PYELOGRAM, URETEROSCOPY AND STENT PLACEMENT Left 06/16/2017   Procedure: CYSTOSCOPY WITH RETROGRADE PYELOGRAM, URETEROSCOPY,STONE EXTRACTION  AND STENT PLACEMENT;  Surgeon: Marcine Matar, MD;  Location: [facility];  Service: Urology;  Laterality: Left;   ESOPHAGOGASTRODUODENOSCOPY  06/19/2004   Two esophageal rings and esophageal web as described above.  All of these were disrupted by passing 56-French Elease Hashimoto dilator/ Candida esophagitis,which appears to be incidental given history of   antibiotic use, but nevertheless will be treated.   ESOPHAGOGASTRODUODENOSCOPY  10/30/2006   Distal tandem esophageal ring status post dilation disruption as  described above.  Otherwise normal esophagus/  Small hiatal hernia otherwise normal stomach, D1 and D2   ESOPHAGOGASTRODUODENOSCOPY N/A 03/22/2015   Dr.Rourk- noncritical schatzki's ring and hiatal hernia-o/w normal EGD.    ESOPHAGOGASTRODUODENOSCOPY (EGD) WITH ESOPHAGEAL DILATION  03/04/2012   RMR- schatzki's ring, hiatal  hernia, polypoid gastric mucosa, bx= minimally active gastritis.   HOLMIUM LASER APPLICATION Left 06/16/2017   Procedure: HOLMIUM LASER APPLICATION;  Surgeon: Marcine Matar, MD;  Location: [facility];  Service: Urology;  Laterality: Left;   IR CV LINE INJECTION  12/27/2021   IR GENERIC HISTORICAL  03/06/2016   IR RADIOLOGIST EVAL & MGMT 03/06/2016 Irish Lack, MD [facility]   IR GENERIC HISTORICAL  06/18/2016   IR RADIOLOGIST EVAL & MGMT 06/18/2016 Irish Lack, MD [facility]   IR IMAGING GUIDED PORT INSERTION  12/18/2021   IR IMAGING GUIDED PORT INSERTION  01/15/2022   IR RADIOLOGIST EVAL & MGMT  10/01/2016   IR RADIOLOGIST EVAL & MGMT  10/15/2017   IR RADIOLOGIST EVAL & MGMT  12/24/2018   IR RADIOLOGIST EVAL & MGMT  01/04/2020   IR RADIOLOGIST EVAL & MGMT  06/20/2021   IR REMOVAL TUN ACCESS W/ PORT W/O FL MOD SED  01/15/2022   LEFT HEART CATH N/A 02/02/2012   Procedure: LEFT HEART CATH;  Surgeon: Runell Gess, MD;  Location: [facility] CATH LAB;  Service: Cardiovascular;  Laterality: N/A;   MEDIASTINOSCOPY     for dx sarcoid   RADIOLOGY WITH ANESTHESIA Left 05/17/2016   Procedure: left renal ablation;  Surgeon: Irish Lack, MD;  Location: [facility];  Service: Radiology;  Laterality: Left;    Social History: 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    Employer: RETIRED  Tobacco Use   Smoking status: Former    Packs/day: 0.10    Years: 2.00    Additional pack years: 0.00    Total pack years: 0.20    Types: Cigarettes, Cigars    Quit date: 05/06/1970    Years since  quitting: 52.4   Smokeless tobacco: Never  Vaping Use   Vaping Use: Never used  Substance and Sexual Activity   Alcohol use: No    Alcohol/week: 0.0 standard drinks of alcohol    Drug use: No   Sexual activity: Never  Other Topics Concern   Not on file  Social History Narrative   Not on file   Social Determinants of Health   Financial Resource Strain: Not on file  Food Insecurity: No Food Insecurity (06/04/2022)   Hunger Vital Sign    Worried About Running Out of Food in the Last Year: Never true    Ran Out of Food in the Last Year: Never true  Transportation Needs: No Transportation Needs (06/04/2022)   PRAPARE - Administrator, Civil Service (Medical): No    Lack of Transportation (Non-Medical): No  Physical Activity: Not on file  Stress: Not on file  Social Connections: Not on file  Intimate Partner Violence: Not At Risk (06/04/2022)   Humiliation, Afraid, Rape, and Kick questionnaire    Fear of Current or Ex-Partner: No    Emotionally Abused: No    Physically Abused: No    Sexually Abused: No    Family History: Family History  Problem Relation Age of Onset   Heart disease Father        deceased age 69   Stroke Mother    Alzheimer's disease Mother    Heart attack Brother        deceased age 27   Cancer Other        niece   Colon cancer Neg Hx     Current Medications:  Current Outpatient Medications:    acetaminophen (TYLENOL) 325 MG tablet, Take 2 tablets (650 mg total) by mouth every 6 (six) hours as needed for mild pain (or Fever >/= 101)., Disp: , Rfl:    acyclovir (ZOVIRAX) 400 MG tablet, TAKE 1 TABLET BY MOUTH TWICE  DAILY, Disp: 180 tablet, Rfl: 0   Ascorbic Acid (VITAMIN C PO), Take 500 mg by mouth every evening., Disp: , Rfl:    aspirin EC 81 MG tablet, Take 1 tablet (81 mg total) by mouth daily with breakfast. For 30 days Only (Patient taking differently: Take 81 mg by mouth daily with breakfast. Every Monday, Wednesday, and Friday), Disp: 30 tablet, Rfl: 0   atorvastatin (LIPITOR) 40 MG tablet, TAKE 1 TABLET BY MOUTH IN THE  EVENING, Disp: 90 tablet, Rfl: 1   Bortezomib (VELCADE IJ), Inject as directed once a week.,  Disp: , Rfl:    brimonidine (ALPHAGAN) 0.2 % ophthalmic solution, Place 1 drop into the left eye 2 (two) times daily., Disp: , Rfl:    cyanocobalamin 1000 MCG tablet, Take 1 tablet (1,000 mcg total) by mouth daily., Disp: 30 tablet, Rfl: 3   dexamethasone (DECADRON) 2 MG tablet, TAKE 5 TABLETS BY MOUTH ONCE  WEEKLY, Disp: 60 tablet, Rfl: 0   dorzolamide-timolol (COSOPT) 22.3-6.8 MG/ML ophthalmic solution, Place 1 drop into the left eye 2 (two) times daily., Disp: , Rfl:    feeding supplement (ENSURE ENLIVE / ENSURE PLUS) LIQD, Take 237 mLs by mouth 3 (three) times daily between meals., Disp: , Rfl:    furosemide (LASIX) 20 MG tablet, TAKE 2 TABLETS BY MOUTH DAILY, IF YOU NOTICE ANY SWELLING., Disp: 90 tablet, Rfl: 1   isosorbide mononitrate (IMDUR) 60 MG 24 hr tablet, TAKE 1 AND 1/2 TABLETS BY MOUTH  IN THE MORNING AND ONE-HALF  TABLET BY MOUTH  AT NIGHT, Disp: 180 tablet, Rfl: 2   levothyroxine (SYNTHROID) 75 MCG tablet, Take 75 mcg by mouth daily before breakfast., Disp: , Rfl:    lidocaine-prilocaine (EMLA) cream, Apply 1 Application topically as needed., Disp: 30 g, Rfl: 1   losartan (COZAAR) 100 MG tablet, TAKE 1 TABLET BY MOUTH DAILY (Patient taking differently: Take 100 mg by mouth daily.), Disp: 90 tablet, Rfl: 3   metoprolol tartrate (LOPRESSOR) 25 MG tablet, TAKE 1 TABLET BY MOUTH IN THE  MORNING AND ONE-HALF TABLET BY  MOUTH IN THE EVENING, Disp: 135 tablet, Rfl: 3   Multiple Vitamins-Minerals (PRESERVISION/LUTEIN) CAPS, Take 1 capsule by mouth 2 (two) times daily., Disp: , Rfl:    nitroGLYCERIN (NITROSTAT) 0.4 MG SL tablet, Place 1 tablet (0.4 mg total) under the tongue every 5 (five) minutes as needed. For chest pain, Disp: 25 tablet, Rfl: 2   pantoprazole (PROTONIX) 40 MG tablet, TAKE 1 TABLET BY MOUTH DAILY  BEFORE BREAKFAST (Patient taking differently: Take 40 mg by mouth daily.), Disp: 90 tablet, Rfl: 3   potassium chloride SA (KLOR-CON M) 20 MEQ tablet, Take 20 mEq by mouth daily.,  Disp: , Rfl:    prochlorperazine (COMPAZINE) 10 MG tablet, Take 1 tablet (10 mg total) by mouth every 6 (six) hours as needed for nausea or vomiting., Disp: 30 tablet, Rfl: 3   ROCKLATAN 0.02-0.005 % SOLN, Place 1 drop into the left eye at bedtime., Disp: , Rfl:    triamcinolone (KENALOG) 0.1 % cream, Apply 1 application. topically 2 (two) times daily as needed (for irritation)., Disp: , Rfl:    vitamin E 200 UNIT capsule, Take 200 Units by mouth every evening., Disp: , Rfl:  No current facility-administered medications for this visit.  Facility-Administered Medications Ordered in Other Visits:    acetaminophen (TYLENOL) 325 MG tablet, , , ,    diphenhydrAMINE (BENADRYL) 25 mg capsule, , , ,    montelukast (SINGULAIR) 10 MG tablet, , , ,    sodium chloride flush (NS) 0.9 % injection 10 mL, 10 mL, Intracatheter, PRN, Doreatha Massed, MD, 10 mL at 10/02/22 1218   Allergies: Allergies  Allergen Reactions   Azithromycin Other (See Comments)    Sore mouth and fever blisters around mouth, sores in nose area as well   Doxazosin Shortness Of Breath   Atenolol Other (See Comments)    UNKNOWN REACTION   Hydrocodone Nausea And Vomiting   Levofloxacin Other (See Comments)    Caused stomach problems.   Morphine Other (See Comments)    "made me crazy"   Penicillins Nausea And Vomiting and Other (See Comments)    Has patient had a PCN reaction causing immediate rash, facial/tongue/throat swelling, SOB or lightheadedness with hypotension: No Has patient had a PCN reaction causing severe rash involving mucus membranes or skin necrosis: No Has patient had a PCN reaction that required hospitalization No Has patient had a PCN reaction occurring within the last 10 years: No If all of the above answers are "NO", then may proceed with Cephalosporin use.    Sulfonamide Derivatives Nausea And Vomiting    REVIEW OF SYSTEMS:   Review of Systems  Constitutional:  Negative for chills, fatigue and  fever.  HENT:   Negative for lump/mass, mouth sores, nosebleeds, sore throat and trouble swallowing.   Eyes:  Negative for eye problems.  Respiratory:  Negative for cough and shortness of breath.   Cardiovascular:  Positive for leg swelling. Negative for chest pain and palpitations.  Gastrointestinal:  Positive for constipation and diarrhea. Negative for abdominal pain, nausea and vomiting.  Genitourinary:  Negative for bladder incontinence, difficulty urinating, dysuria, frequency, hematuria and nocturia.   Musculoskeletal:  Negative for arthralgias, back pain, flank pain, myalgias and neck pain.  Skin:  Negative for itching and rash.  Neurological:  Negative for dizziness, headaches and numbness.  Hematological:  Does not bruise/bleed easily.  Psychiatric/Behavioral:  Negative for depression, sleep disturbance and suicidal ideas. The patient is not nervous/anxious.   All other systems reviewed and are negative.    VITALS:   There were no vitals taken for this visit.  Wt Readings from Last 3 Encounters:  10/02/22 130 lb (59 kg)  09/20/22 136 lb 3.2 oz (61.8 kg)  09/18/22 134 lb 6.4 oz (61 kg)    There is no height or weight on file to calculate BMI.  Performance status (ECOG): 1 - Symptomatic but completely ambulatory  PHYSICAL EXAM:   Physical Exam Vitals and nursing note reviewed. Exam conducted with a chaperone present.  Constitutional:      Appearance: Normal appearance.  Cardiovascular:     Rate and Rhythm: Normal rate and regular rhythm.     Pulses: Normal pulses.     Heart sounds: Normal heart sounds.  Pulmonary:     Effort: Pulmonary effort is normal.     Breath sounds: Normal breath sounds.  Abdominal:     Palpations: Abdomen is soft. There is no hepatomegaly, splenomegaly or mass.     Tenderness: There is no abdominal tenderness.  Musculoskeletal:     Right lower leg: Edema present.     Left lower leg: Edema present.  Lymphadenopathy:     Cervical: No  cervical adenopathy.     Right cervical: No superficial, deep or posterior cervical adenopathy.    Left cervical: No superficial, deep or posterior cervical adenopathy.     Upper Body:     Right upper body: No supraclavicular or axillary adenopathy.     Left upper body: No supraclavicular or axillary adenopathy.  Neurological:     General: No focal deficit present.     Mental Status: He is alert and oriented to person, place, and time.  Psychiatric:        Mood and Affect: Mood normal.        Behavior: Behavior normal.     LABS:      Latest Ref Rng & Units 10/02/2022    7:50 AM 09/24/2022    9:53 AM 09/18/2022    7:49 AM  CBC  WBC 4.0 - 10.5 K/uL 4.5  3.4  3.6   Hemoglobin 13.0 - 17.0 g/dL 8.5  8.9  8.7   Hematocrit 39.0 - 52.0 % 26.0  27.6  26.7   Platelets 150 - 400 K/uL 150  83  90       Latest Ref Rng & Units 10/02/2022    7:50 AM 09/18/2022    7:49 AM 09/11/2022    7:45 AM  CMP  Glucose 70 - 99 mg/dL 99  95  94   BUN 8 - 23 mg/dL 21  15  20    Creatinine 0.61 - 1.24 mg/dL 1.61  0.96  0.45   Sodium 135 - 145 mmol/L 133  132  132   Potassium 3.5 - 5.1 mmol/L 3.7  3.7  3.6   Chloride 98 - 111 mmol/L 104  102  103   CO2 22 - 32 mmol/L 20  25  24    Calcium 8.9 -  10.3 mg/dL 8.8  8.4  8.5   Total Protein 6.5 - 8.1 g/dL 8.3  7.6  8.0   Total Bilirubin 0.3 - 1.2 mg/dL 0.6  0.7  0.6   Alkaline Phos 38 - 126 U/L 67  54  56   AST 15 - 41 U/L 16  18  20    ALT 0 - 44 U/L 17  20  23       No results found for: "CEA1", "CEA" / No results found for: "CEA1", "CEA" No results found for: "PSA1" No results found for: "WGN562" No results found for: "CAN125"  Lab Results  Component Value Date   TOTALPROTELP 7.5 09/24/2022   ALBUMINELP 3.1 09/24/2022   A1GS 0.2 09/24/2022   A2GS 0.8 09/24/2022   BETS 0.7 09/24/2022   GAMS 2.7 (H) 09/24/2022   MSPIKE 2.4 (H) 09/24/2022   SPEI Comment 09/24/2022   Lab Results  Component Value Date   TIBC 257 08/23/2021   TIBC 298 02/07/2021    TIBC 263 05/27/2017   FERRITIN 1,415 (H) 01/24/2022   FERRITIN 939 (H) 08/23/2021   FERRITIN 123 02/07/2021   IRONPCTSAT 90 (H) 08/23/2021   IRONPCTSAT 17 (L) 02/07/2021   IRONPCTSAT 44 05/27/2017   Lab Results  Component Value Date   LDH 115 05/01/2021   LDH 120 04/09/2021   LDH 124 04/02/2021     STUDIES:   No results found.

## 2022-10-02 ENCOUNTER — Inpatient Hospital Stay: Payer: Medicare Other

## 2022-10-02 ENCOUNTER — Inpatient Hospital Stay (HOSPITAL_BASED_OUTPATIENT_CLINIC_OR_DEPARTMENT_OTHER): Payer: Medicare Other | Admitting: Hematology

## 2022-10-02 ENCOUNTER — Encounter: Payer: Self-pay | Admitting: Hematology

## 2022-10-02 VITALS — BP 135/59 | HR 69 | Temp 98.5°F | Resp 16

## 2022-10-02 DIAGNOSIS — C9000 Multiple myeloma not having achieved remission: Secondary | ICD-10-CM

## 2022-10-02 DIAGNOSIS — D693 Immune thrombocytopenic purpura: Secondary | ICD-10-CM | POA: Diagnosis not present

## 2022-10-02 DIAGNOSIS — Z5112 Encounter for antineoplastic immunotherapy: Secondary | ICD-10-CM | POA: Diagnosis not present

## 2022-10-02 DIAGNOSIS — D630 Anemia in neoplastic disease: Secondary | ICD-10-CM | POA: Diagnosis not present

## 2022-10-02 LAB — CBC WITH DIFFERENTIAL/PLATELET
Abs Immature Granulocytes: 0.03 10*3/uL (ref 0.00–0.07)
Basophils Absolute: 0 10*3/uL (ref 0.0–0.1)
Basophils Relative: 0 %
Eosinophils Absolute: 0.1 10*3/uL (ref 0.0–0.5)
Eosinophils Relative: 2 %
HCT: 26 % — ABNORMAL LOW (ref 39.0–52.0)
Hemoglobin: 8.5 g/dL — ABNORMAL LOW (ref 13.0–17.0)
Immature Granulocytes: 1 %
Lymphocytes Relative: 17 %
Lymphs Abs: 0.8 10*3/uL (ref 0.7–4.0)
MCH: 34.1 pg — ABNORMAL HIGH (ref 26.0–34.0)
MCHC: 32.7 g/dL (ref 30.0–36.0)
MCV: 104.4 fL — ABNORMAL HIGH (ref 80.0–100.0)
Monocytes Absolute: 0.7 10*3/uL (ref 0.1–1.0)
Monocytes Relative: 16 %
Neutro Abs: 2.9 10*3/uL (ref 1.7–7.7)
Neutrophils Relative %: 64 %
Platelets: 150 10*3/uL (ref 150–400)
RBC: 2.49 MIL/uL — ABNORMAL LOW (ref 4.22–5.81)
WBC: 4.5 10*3/uL (ref 4.0–10.5)
nRBC: 0 % (ref 0.0–0.2)

## 2022-10-02 LAB — COMPREHENSIVE METABOLIC PANEL
ALT: 17 U/L (ref 0–44)
AST: 16 U/L (ref 15–41)
Albumin: 2.9 g/dL — ABNORMAL LOW (ref 3.5–5.0)
Alkaline Phosphatase: 67 U/L (ref 38–126)
Anion gap: 9 (ref 5–15)
BUN: 21 mg/dL (ref 8–23)
CO2: 20 mmol/L — ABNORMAL LOW (ref 22–32)
Calcium: 8.8 mg/dL — ABNORMAL LOW (ref 8.9–10.3)
Chloride: 104 mmol/L (ref 98–111)
Creatinine, Ser: 0.87 mg/dL (ref 0.61–1.24)
GFR, Estimated: >60 mL/min (ref >60)
Glucose, Bld: 99 mg/dL (ref 70–99)
Potassium: 3.7 mmol/L (ref 3.5–5.1)
Sodium: 133 mmol/L — ABNORMAL LOW (ref 135–145)
Total Bilirubin: 0.6 mg/dL (ref 0.3–1.2)
Total Protein: 8.3 g/dL — ABNORMAL HIGH (ref 6.5–8.1)

## 2022-10-02 LAB — SAMPLE TO BLOOD BANK

## 2022-10-02 LAB — MAGNESIUM: Magnesium: 2 mg/dL (ref 1.7–2.4)

## 2022-10-02 LAB — PHOSPHORUS: Phosphorus: 2.9 mg/dL (ref 2.5–4.6)

## 2022-10-02 MED ORDER — BORTEZOMIB CHEMO SQ INJECTION 3.5 MG (2.5MG/ML)
1.0000 mg/m2 | Freq: Once | INTRAMUSCULAR | Status: AC
  Administered 2022-10-02: 1.75 mg via SUBCUTANEOUS
  Filled 2022-10-02: qty 0.7

## 2022-10-02 MED ORDER — ONDANSETRON HCL 4 MG/2ML IJ SOLN
8.0000 mg | Freq: Once | INTRAMUSCULAR | Status: AC
  Administered 2022-10-02: 8 mg via INTRAVENOUS
  Filled 2022-10-02: qty 4

## 2022-10-02 MED ORDER — CETIRIZINE HCL 10 MG/ML IV SOLN
10.0000 mg | Freq: Once | INTRAVENOUS | Status: AC
  Administered 2022-10-02: 10 mg via INTRAVENOUS
  Filled 2022-10-02: qty 1

## 2022-10-02 MED ORDER — ACETAMINOPHEN 325 MG PO TABS
650.0000 mg | ORAL_TABLET | Freq: Once | ORAL | Status: AC
  Administered 2022-10-02: 650 mg via ORAL
  Filled 2022-10-02: qty 2

## 2022-10-02 MED ORDER — SODIUM CHLORIDE 0.9% FLUSH
10.0000 mL | INTRAVENOUS | Status: DC | PRN
  Administered 2022-10-02: 10 mL

## 2022-10-02 MED ORDER — METHYLPREDNISOLONE SODIUM SUCC 125 MG IJ SOLR
100.0000 mg | Freq: Once | INTRAMUSCULAR | Status: AC
  Administered 2022-10-02: 100 mg via INTRAVENOUS
  Filled 2022-10-02: qty 2

## 2022-10-02 MED ORDER — SODIUM CHLORIDE 0.9 % IV SOLN
16.0000 mg/kg | Freq: Once | INTRAVENOUS | Status: AC
  Administered 2022-10-02: 900 mg via INTRAVENOUS
  Filled 2022-10-02: qty 40

## 2022-10-02 MED ORDER — SODIUM CHLORIDE 0.9 % IV SOLN: Freq: Once | INTRAVENOUS | Status: AC

## 2022-10-02 MED ORDER — HEPARIN SOD (PORK) LOCK FLUSH 100 UNIT/ML IV SOLN
500.0000 [IU] | Freq: Once | INTRAVENOUS | Status: AC | PRN
  Administered 2022-10-02: 500 [IU]

## 2022-10-02 NOTE — Patient Instructions (Signed)
[facility] at [facility] Discharge Instructions   You were seen and examined today by Dr. Katragadda.  He reviewed the results of your lab work which are normal/stable.   We will proceed with your treatment today.  Return as scheduled.    Thank you for choosing [facility] at [facility] to provide your oncology and hematology care.  To afford each patient quality time with our provider, please arrive at least 15 minutes before your scheduled appointment time.   If you have a lab appointment with the Cancer Center please come in thru the Main Entrance and check in at the main information desk.  You need to re-schedule your appointment should you arrive 10 or more minutes late.  We strive to give you quality time with our providers, and arriving late affects you and other patients whose appointments are after yours.  Also, if you no show three or more times for appointments you may be dismissed from the clinic at the providers discretion.     Again, thank you for choosing [facility].  Our hope is that these requests will decrease the amount of time that you wait before being seen by our physicians.       _____________________________________________________________  Should you have questions after your visit to [facility], please contact our office at [phone] and follow the prompts.  Our office hours are 8:00 a.m. and 4:30 p.m. Monday - Friday.  Please note that voicemails left after 4:00 p.m. may not be returned until the following business day.  We are closed weekends and major holidays.  You do have access to a nurse 24-7, just call the main number to the clinic [phone] and do not press any options, hold on the line and a nurse will answer the phone.    For prescription refill requests, have your pharmacy contact our office and allow 72 hours.    Due to Covid, you will need to wear a mask upon entering  the hospital. If you do not have a mask, a mask will be given to you at the Main Entrance upon arrival. For doctor visits, patients may have 1 support person age 18 or older with them. For treatment visits, patients can not have anyone with them due to social distancing guidelines and our immunocompromised population.      

## 2022-10-02 NOTE — Progress Notes (Signed)
Patient presents today for Darzalex and Velcade infusion. Patient is in satisfactory condition with no new complaints voiced.  Vital signs are stable.  Labs reviewed by Dr. Ellin Saba during the office visit and all labs are within treatment parameters.  We will proceed with treatment per MD orders.   Treatment given today per MD orders. Tolerated infusion without adverse affects. Vital signs stable. No complaints at this time. Discharged from clinic ambulatory in stable condition. Alert and oriented x 3. F/U with [facility] as scheduled.

## 2022-10-02 NOTE — Progress Notes (Signed)
Patient has been examined by Dr. Katragadda. Vital signs and labs have been reviewed by MD - ANC, Creatinine, LFTs, hemoglobin, and platelets are within treatment parameters per M.D. - pt may proceed with treatment.  Primary RN and pharmacy notified.  

## 2022-10-02 NOTE — Patient Instructions (Signed)
[facility]  Discharge Instructions: Thank you for choosing [facility] to provide your oncology and hematology care.  If you have a lab appointment with the Cancer Center - please note that after April 8th, 2024, all labs will be drawn in the cancer center.  You do not have to check in or register with the main entrance as you have in the past but will complete your check-in in the cancer center.  Wear comfortable clothing and clothing appropriate for easy access to any Portacath or PICC line.   We strive to give you quality time with your provider. You may need to reschedule your appointment if you arrive late (15 or more minutes).  Arriving late affects you and other patients whose appointments are after yours.  Also, if you miss three or more appointments without notifying the office, you may be dismissed from the clinic at the provider's discretion.      For prescription refill requests, have your pharmacy contact our office and allow 72 hours for refills to be completed.    Today you received the following chemotherapy and/or immunotherapy agents Dara IV and Velcade   To help prevent nausea and vomiting after your treatment, we encourage you to take your nausea medication as directed.  Daratumumab; Hyaluronidase Injection What is this medication? DARATUMUMAB; HYALURONIDASE (dar a toom ue mab; hye al ur ON i dase) treats multiple myeloma, a type of bone marrow cancer. Daratumumab works by blocking a protein that causes cancer cells to grow and multiply. This helps to slow or stop the spread of cancer cells. Hyaluronidase works by increasing the absorption of other medications in the body to help them work better. This medication may also be used treat amyloidosis, a condition that causes the buildup of a protein (amyloid) in your body. It works by reducing the buildup of this protein, which decreases symptoms. It is a combination medication that contains a  monoclonal antibody. This medicine may be used for other purposes; ask your health care provider or pharmacist if you have questions. COMMON BRAND NAME(S): DARZALEX FASPRO What should I tell my care team before I take this medication? They need to know if you have any of these conditions: Heart disease Infection, such as chickenpox, cold sores, herpes, hepatitis B Lung or breathing disease An unusual or allergic reaction to daratumumab, hyaluronidase, other medications, foods, dyes, or preservatives Pregnant or trying to get pregnant Breast-feeding How should I use this medication? This medication is injected under the skin. It is given by your care team in a hospital or clinic setting. Talk to your care team about the use of this medication in children. Special care may be needed. Overdosage: If you think you have taken too much of this medicine contact a poison control center or emergency room at once. NOTE: This medicine is only for you. Do not share this medicine with others. What if I miss a dose? Keep appointments for follow-up doses. It is important not to miss your dose. Call your care team if you are unable to keep an appointment. What may interact with this medication? Interactions have not been studied. This list may not describe all possible interactions. Give your health care provider a list of all the medicines, herbs, non-prescription drugs, or dietary supplements you use. Also tell them if you smoke, drink alcohol, or use illegal drugs. Some items may interact with your medicine. What should I watch for while using this medication? Your condition will  be monitored carefully while you are receiving this medication. This medication can cause serious allergic reactions. To reduce your risk, your care team may give you other medication to take before receiving this one. Be sure to follow the directions from your care team. This medication can affect the results of blood tests to  match your blood type. These changes can last for up to 6 months after the final dose. Your care team will do blood tests to match your blood type before you start treatment. Tell all of your care team that you are being treated with this medication before receiving a blood transfusion. This medication can affect the results of some tests used to determine treatment response; extra tests may be needed to evaluate response. Talk to your care team if you wish to become pregnant or think you are pregnant. This medication can cause serious birth defects if taken during pregnancy and for 3 months after the last dose. A reliable form of contraception is recommended while taking this medication and for 3 months after the last dose. Talk to your care team about effective forms of contraception. Do not breast-feed while taking this medication. What side effects may I notice from receiving this medication? Side effects that you should report to your care team as soon as possible: Allergic reactions--skin rash, itching, hives, swelling of the face, lips, tongue, or throat Heart rhythm changes--fast or irregular heartbeat, dizziness, feeling faint or lightheaded, chest pain, trouble breathing Infection--fever, chills, cough, sore throat, wounds that don't heal, pain or trouble when passing urine, general feeling of discomfort or being unwell Infusion reactions--chest pain, shortness of breath or trouble breathing, feeling faint or lightheaded Sudden eye pain or change in vision such as blurry vision, seeing halos around lights, vision loss Unusual bruising or bleeding Side effects that usually do not require medical attention (report to your care team if they continue or are bothersome): Constipation Diarrhea Fatigue Nausea Pain, tingling, or numbness in the hands or feet Swelling of the ankles, hands, or feet This list may not describe all possible side effects. Call your doctor for medical advice about side  effects. You may report side effects to FDA at 1-800-FDA-1088. Where should I keep my medication? This medication is given in a hospital or clinic. It will not be stored at home. NOTE: This sheet is a summary. It may not cover all possible information. If you have questions about this medicine, talk to your doctor, pharmacist, or health care provider.  2024 Elsevier/Gold Standard (2021-08-28 00:00:00)  Bortezomib Injection What is this medication? BORTEZOMIB (bor TEZ oh mib) treats lymphoma. It may also be used to treat multiple myeloma, a type of bone marrow cancer. It works by blocking a protein that causes cancer cells to grow and multiply. This helps to slow or stop the spread of cancer cells. This medicine may be used for other purposes; ask your health care provider or pharmacist if you have questions. COMMON BRAND NAME(S): Velcade What should I tell my care team before I take this medication? They need to know if you have any of these conditions: Dehydration Diabetes Heart disease Liver disease Tingling of the fingers or toes or other nerve disorder An unusual or allergic reaction to bortezomib, other medications, foods, dyes, or preservatives If you or your partner are pregnant or trying to get pregnant Breastfeeding How should I use this medication? This medication is injected into a vein or under the skin. It is given by your care team in  a hospital or clinic setting. Talk to your care team about the use of this medication in children. Special care may be needed. Overdosage: If you think you have taken too much of this medicine contact a poison control center or emergency room at once. NOTE: This medicine is only for you. Do not share this medicine with others. What if I miss a dose? Keep appointments for follow-up doses. It is important not to miss your dose. Call your care team if you are unable to keep an appointment. What may interact with this  medication? Ketoconazole Rifampin This list may not describe all possible interactions. Give your health care provider a list of all the medicines, herbs, non-prescription drugs, or dietary supplements you use. Also tell them if you smoke, drink alcohol, or use illegal drugs. Some items may interact with your medicine. What should I watch for while using this medication? Your condition will be monitored carefully while you are receiving this medication. You may need blood work while taking this medication. This medication may affect your coordination, reaction time, or judgment. Do not drive or operate machinery until you know how this medication affects you. Sit up or stand slowly to reduce the risk of dizzy or fainting spells. Drinking alcohol with this medication can increase the risk of these side effects. This medication may increase your risk of getting an infection. Call your care team for advice if you get a fever, chills, sore throat, or other symptoms of a cold or flu. Do not treat yourself. Try to avoid being around people who are sick. Check with your care team if you have severe diarrhea, nausea, and vomiting, or if you sweat a lot. The loss of too much body fluid may make it dangerous for you to take this medication. Talk to your care team if you may be pregnant. Serious birth defects can occur if you take this medication during pregnancy and for 7 months after the last dose. You will need a negative pregnancy test before starting this medication. Contraception is recommended while taking this medication and for 7 months after the last dose. Your care team can help you find the option that works for you. If your partner can get pregnant, use a condom during sex while taking this medication and for 4 months after the last dose. Do not breastfeed while taking this medication and for 2 months after the last dose. This medication may cause infertility. Talk to your care team if you are  concerned about your fertility. What side effects may I notice from receiving this medication? Side effects that you should report to your care team as soon as possible: Allergic reactions--skin rash, itching, hives, swelling of the face, lips, tongue, or throat Bleeding--bloody or black, tar-like stools, vomiting blood or brown material that looks like coffee grounds, red or dark brown urine, small red or purple spots on skin, unusual bruising or bleeding Bleeding in the brain--severe headache, stiff neck, confusion, dizziness, change in vision, numbness or weakness of the face, arm, or leg, trouble speaking, trouble walking, vomiting Bowel blockage--stomach cramping, unable to have a bowel movement or pass gas, loss of appetite, vomiting Heart failure--shortness of breath, swelling of the ankles, feet, or hands, sudden weight gain, unusual weakness or fatigue Infection--fever, chills, cough, sore throat, wounds that don't heal, pain or trouble when passing urine, general feeling of discomfort or being unwell Liver injury--right upper belly pain, loss of appetite, nausea, light-colored stool, dark yellow or brown urine, yellowing skin  or eyes, unusual weakness or fatigue Low blood pressure--dizziness, feeling faint or lightheaded, blurry vision Lung injury--shortness of breath or trouble breathing, cough, spitting up blood, chest pain, fever Pain, tingling, or numbness in the hands or feet Severe or prolonged diarrhea Stomach pain, bloody diarrhea, pale skin, unusual weakness or fatigue, decrease in the amount of urine, which may be signs of hemolytic uremic syndrome Sudden and severe headache, confusion, change in vision, seizures, which may be signs of posterior reversible encephalopathy syndrome (PRES) TTP--purple spots on the skin or inside the mouth, pale skin, yellowing skin or eyes, unusual weakness or fatigue, fever, fast or irregular heartbeat, confusion, change in vision, trouble speaking,  trouble walking Tumor lysis syndrome (TLS)--nausea, vomiting, diarrhea, decrease in the amount of urine, dark urine, unusual weakness or fatigue, confusion, muscle pain or cramps, fast or irregular heartbeat, joint pain Side effects that usually do not require medical attention (report to your care team if they continue or are bothersome): Constipation Diarrhea Fatigue Loss of appetite Nausea This list may not describe all possible side effects. Call your doctor for medical advice about side effects. You may report side effects to FDA at 1-800-FDA-1088. Where should I keep my medication? This medication is given in a hospital or clinic. It will not be stored at home. NOTE: This sheet is a summary. It may not cover all possible information. If you have questions about this medicine, talk to your doctor, pharmacist, or health care provider.  2024 Elsevier/Gold Standard (2021-09-25 00:00:00)    BELOW ARE SYMPTOMS THAT SHOULD BE REPORTED IMMEDIATELY: *FEVER GREATER THAN 100.4 F (38 C) OR HIGHER *CHILLS OR SWEATING *NAUSEA AND VOMITING THAT IS NOT CONTROLLED WITH YOUR NAUSEA MEDICATION *UNUSUAL SHORTNESS OF BREATH *UNUSUAL BRUISING OR BLEEDING *URINARY PROBLEMS (pain or burning when urinating, or frequent urination) *BOWEL PROBLEMS (unusual diarrhea, constipation, pain near the anus) TENDERNESS IN MOUTH AND THROAT WITH OR WITHOUT PRESENCE OF ULCERS (sore throat, sores in mouth, or a toothache) UNUSUAL RASH, SWELLING OR PAIN  UNUSUAL VAGINAL DISCHARGE OR ITCHING   Items with * indicate a potential emergency and should be followed up as soon as possible or go to the Emergency Department if any problems should occur.  Please show the CHEMOTHERAPY ALERT CARD or IMMUNOTHERAPY ALERT CARD at check-in to the Emergency Department and triage nurse.  Should you have questions after your visit or need to cancel or reschedule your appointment, please contact [facility]  [phone]  and follow the prompts.  Office hours are 8:00 a.m. to 4:30 p.m. Monday - Friday. Please note that voicemails left after 4:00 p.m. may not be returned until the following business day.  We are closed weekends and major holidays. You have access to a nurse at all times for urgent questions. Please call the main number to the clinic [phone] and follow the prompts.  For any non-urgent questions, you may also contact your provider using MyChart. We now offer e-Visits for anyone 32 and older to request care online for non-urgent symptoms. For details visit [email or web address].   Also download the MyChart app! Go to the app store, search "MyChart", open the app, select [place], and log in with your MyChart username and password.

## 2022-10-03 ENCOUNTER — Inpatient Hospital Stay: Payer: Medicare Other

## 2022-10-04 ENCOUNTER — Other Ambulatory Visit: Payer: Self-pay

## 2022-10-04 DIAGNOSIS — Z515 Encounter for palliative care: Secondary | ICD-10-CM

## 2022-10-04 NOTE — Progress Notes (Signed)
PATIENT NAME: TYLIL KANHAI DOB: Nov 29, 1928 MRN: [id number]  PRIMARY CARE PROVIDER: Pcp, No  RESPONSIBLE PARTY:  Acct ID - Guarantor Home Phone Work Phone Relationship Acct Type  [id number] TUG, REUM* [phone]  Self P/F     [address], [place], [place] [address]   I connected with Mrs. Wynn-wife of  VIRAAJ BIEHL on 10/04/22 by a video enabled telemedicine application and verified that I am speaking with the correct person using two identifiers.   I discussed the limitations of evaluation and management by telemedicine. The patient expressed understanding and agreed to proceed.   Telephonic visit completed with wife as patient is very hard of hearing.  Discussed changes within the palliative care program and now virtual for patients outside of [place].  Wife has requested in-home visits if possible due to patient's hearing difficulty.  We discussed [facility] being a possible option.  Wife would like contact information provided to [place].   Wife reports patient is doing well with multiple myelomia treatments but does have some confusion afterwards but then clears.  Family is concerned about patient continuing to drive.  He will be seen by ophthalmology soon and likely have license revoked.   Phone call made to [place] and spoke with [place].  Demographic information provided.    Patient will be discharged from [facility] effective 10/04/22 as family would prefer in-person visits vs virtual.       Truitt Merle, RN

## 2022-10-07 ENCOUNTER — Telehealth: Payer: Self-pay | Admitting: *Deleted

## 2022-10-07 DIAGNOSIS — Z881 Allergy status to other antibiotic agents status: Secondary | ICD-10-CM | POA: Diagnosis not present

## 2022-10-07 DIAGNOSIS — Z886 Allergy status to analgesic agent status: Secondary | ICD-10-CM | POA: Diagnosis not present

## 2022-10-07 DIAGNOSIS — R42 Dizziness and giddiness: Secondary | ICD-10-CM | POA: Diagnosis not present

## 2022-10-07 DIAGNOSIS — Z882 Allergy status to sulfonamides status: Secondary | ICD-10-CM | POA: Diagnosis not present

## 2022-10-07 DIAGNOSIS — Z885 Allergy status to narcotic agent status: Secondary | ICD-10-CM | POA: Diagnosis not present

## 2022-10-07 DIAGNOSIS — I10 Essential (primary) hypertension: Secondary | ICD-10-CM | POA: Diagnosis not present

## 2022-10-07 DIAGNOSIS — Z880 Allergy status to penicillin: Secondary | ICD-10-CM | POA: Diagnosis not present

## 2022-10-07 NOTE — Telephone Encounter (Signed)
Patient's wife called with vague symptoms stating that he is confused and "just doesn't feel right".  Would not eat this morning and said that patient expressed that "something just isn't right".  Advised to call 911, as this is not his normal behavior.  Verbalized understanding.

## 2022-10-09 ENCOUNTER — Inpatient Hospital Stay: Payer: Medicare Other | Attending: Hematology

## 2022-10-09 ENCOUNTER — Inpatient Hospital Stay: Payer: Medicare Other

## 2022-10-09 DIAGNOSIS — R197 Diarrhea, unspecified: Secondary | ICD-10-CM | POA: Insufficient documentation

## 2022-10-09 DIAGNOSIS — C9000 Multiple myeloma not having achieved remission: Secondary | ICD-10-CM | POA: Diagnosis not present

## 2022-10-09 DIAGNOSIS — R112 Nausea with vomiting, unspecified: Secondary | ICD-10-CM | POA: Diagnosis not present

## 2022-10-09 DIAGNOSIS — Z5112 Encounter for antineoplastic immunotherapy: Secondary | ICD-10-CM | POA: Diagnosis not present

## 2022-10-09 LAB — COMPREHENSIVE METABOLIC PANEL
ALT: 22 U/L (ref 0–44)
AST: 20 U/L (ref 15–41)
Albumin: 2.8 g/dL — ABNORMAL LOW (ref 3.5–5.0)
Alkaline Phosphatase: 64 U/L (ref 38–126)
Anion gap: 6 (ref 5–15)
BUN: 19 mg/dL (ref 8–23)
CO2: 24 mmol/L (ref 22–32)
Calcium: 8.7 mg/dL — ABNORMAL LOW (ref 8.9–10.3)
Chloride: 102 mmol/L (ref 98–111)
Creatinine, Ser: 0.84 mg/dL (ref 0.61–1.24)
GFR, Estimated: >60 mL/min (ref >60)
Glucose, Bld: 105 mg/dL — ABNORMAL HIGH (ref 70–99)
Potassium: 3.7 mmol/L (ref 3.5–5.1)
Sodium: 132 mmol/L — ABNORMAL LOW (ref 135–145)
Total Bilirubin: 0.4 mg/dL (ref 0.3–1.2)
Total Protein: 7.7 g/dL (ref 6.5–8.1)

## 2022-10-09 LAB — CBC WITH DIFFERENTIAL/PLATELET
Abs Immature Granulocytes: 0.01 10*3/uL (ref 0.00–0.07)
Basophils Absolute: 0 10*3/uL (ref 0.0–0.1)
Basophils Relative: 0 %
Eosinophils Absolute: 0 10*3/uL (ref 0.0–0.5)
Eosinophils Relative: 1 %
HCT: 24.9 % — ABNORMAL LOW (ref 39.0–52.0)
Hemoglobin: 8.2 g/dL — ABNORMAL LOW (ref 13.0–17.0)
Immature Granulocytes: 0 %
Lymphocytes Relative: 30 %
Lymphs Abs: 1 10*3/uL (ref 0.7–4.0)
MCH: 34.5 pg — ABNORMAL HIGH (ref 26.0–34.0)
MCHC: 32.9 g/dL (ref 30.0–36.0)
MCV: 104.6 fL — ABNORMAL HIGH (ref 80.0–100.0)
Monocytes Absolute: 0.7 10*3/uL (ref 0.1–1.0)
Monocytes Relative: 23 %
Neutro Abs: 1.5 10*3/uL — ABNORMAL LOW (ref 1.7–7.7)
Neutrophils Relative %: 46 %
Platelets: 120 10*3/uL — ABNORMAL LOW (ref 150–400)
RBC: 2.38 MIL/uL — ABNORMAL LOW (ref 4.22–5.81)
WBC: 3.2 10*3/uL — ABNORMAL LOW (ref 4.0–10.5)
nRBC: 0 % (ref 0.0–0.2)

## 2022-10-09 LAB — MAGNESIUM: Magnesium: 2 mg/dL (ref 1.7–2.4)

## 2022-10-09 LAB — SAMPLE TO BLOOD BANK

## 2022-10-09 MED ORDER — METHYLPREDNISOLONE SODIUM SUCC 125 MG IJ SOLR
100.0000 mg | Freq: Once | INTRAMUSCULAR | Status: AC
  Administered 2022-10-09: 100 mg via INTRAVENOUS
  Filled 2022-10-09: qty 2

## 2022-10-09 MED ORDER — BORTEZOMIB CHEMO SQ INJECTION 3.5 MG (2.5MG/ML)
1.0000 mg/m2 | Freq: Once | INTRAMUSCULAR | Status: AC
  Administered 2022-10-09: 1.75 mg via SUBCUTANEOUS
  Filled 2022-10-09: qty 0.7

## 2022-10-09 MED ORDER — CETIRIZINE HCL 10 MG/ML IV SOLN
10.0000 mg | Freq: Once | INTRAVENOUS | Status: AC
  Administered 2022-10-09: 10 mg via INTRAVENOUS
  Filled 2022-10-09: qty 1

## 2022-10-09 MED ORDER — ACETAMINOPHEN 325 MG PO TABS
650.0000 mg | ORAL_TABLET | Freq: Once | ORAL | Status: AC
  Administered 2022-10-09: 650 mg via ORAL
  Filled 2022-10-09: qty 2

## 2022-10-09 MED ORDER — SODIUM CHLORIDE 0.9% FLUSH
10.0000 mL | INTRAVENOUS | Status: DC | PRN
  Administered 2022-10-09: 10 mL

## 2022-10-09 MED ORDER — HEPARIN SOD (PORK) LOCK FLUSH 100 UNIT/ML IV SOLN
500.0000 [IU] | Freq: Once | INTRAVENOUS | Status: AC | PRN
  Administered 2022-10-09: 500 [IU]

## 2022-10-09 MED ORDER — SODIUM CHLORIDE 0.9 % IV SOLN: Freq: Once | INTRAVENOUS | Status: AC

## 2022-10-09 MED ORDER — ONDANSETRON HCL 4 MG/2ML IJ SOLN
8.0000 mg | Freq: Once | INTRAMUSCULAR | Status: AC
  Administered 2022-10-09: 8 mg via INTRAVENOUS
  Filled 2022-10-09: qty 4

## 2022-10-09 MED ORDER — LOPERAMIDE HCL 2 MG PO CAPS
4.0000 mg | ORAL_CAPSULE | Freq: Once | ORAL | Status: AC
  Administered 2022-10-09: 4 mg via ORAL
  Filled 2022-10-09: qty 2

## 2022-10-09 MED ORDER — SODIUM CHLORIDE 0.9 % IV SOLN
16.0000 mg/kg | Freq: Once | INTRAVENOUS | Status: AC
  Administered 2022-10-09: 900 mg via INTRAVENOUS
  Filled 2022-10-09: qty 40

## 2022-10-09 NOTE — Progress Notes (Signed)
Patient took dexamethasone at home with breakfast.    Patient having diarrhea.  Imodium 2 tabs by mouth verbal order Dr. Ellin Saba.     Patient tolerated therapy and Velcade injection with no complaints voiced.  Side effects with management reviewed with understanding verbalized.  Port site clean and dry with no bruising or swelling noted at site.  Good blood return noted before and after administration of therapy.  Band aid applied.  Patient left in satisfactory condition with VSS and no s/s of distress noted.

## 2022-10-09 NOTE — Patient Instructions (Signed)
[facility]  Discharge Instructions: Thank you for choosing [facility] to provide your oncology and hematology care.  If you have a lab appointment with the Cancer Center - please note that after April 8th, 2024, all labs will be drawn in the cancer center.  You do not have to check in or register with the main entrance as you have in the past but will complete your check-in in the cancer center.  Wear comfortable clothing and clothing appropriate for easy access to any Portacath or PICC line.   We strive to give you quality time with your provider. You may need to reschedule your appointment if you arrive late (15 or more minutes).  Arriving late affects you and other patients whose appointments are after yours.  Also, if you miss three or more appointments without notifying the office, you may be dismissed from the clinic at the provider's discretion.      For prescription refill requests, have your pharmacy contact our office and allow 72 hours for refills to be completed.    Today you received the following chemotherapy and/or immunotherapy agents darzalex.        To help prevent nausea and vomiting after your treatment, we encourage you to take your nausea medication as directed.  BELOW ARE SYMPTOMS THAT SHOULD BE REPORTED IMMEDIATELY: *FEVER GREATER THAN 100.4 F (38 C) OR HIGHER *CHILLS OR SWEATING *NAUSEA AND VOMITING THAT IS NOT CONTROLLED WITH YOUR NAUSEA MEDICATION *UNUSUAL SHORTNESS OF BREATH *UNUSUAL BRUISING OR BLEEDING *URINARY PROBLEMS (pain or burning when urinating, or frequent urination) *BOWEL PROBLEMS (unusual diarrhea, constipation, pain near the anus) TENDERNESS IN MOUTH AND THROAT WITH OR WITHOUT PRESENCE OF ULCERS (sore throat, sores in mouth, or a toothache) UNUSUAL RASH, SWELLING OR PAIN  UNUSUAL VAGINAL DISCHARGE OR ITCHING   Items with * indicate a potential emergency and should be followed up as soon as possible or go to  the Emergency Department if any problems should occur.  Please show the CHEMOTHERAPY ALERT CARD or IMMUNOTHERAPY ALERT CARD at check-in to the Emergency Department and triage nurse.  Should you have questions after your visit or need to cancel or reschedule your appointment, please contact [facility] AT [facility] [phone]  and follow the prompts.  Office hours are 8:00 a.m. to 4:30 p.m. Monday - Friday. Please note that voicemails left after 4:00 p.m. may not be returned until the following business day.  We are closed weekends and major holidays. You have access to a nurse at all times for urgent questions. Please call the main number to the clinic [phone] and follow the prompts.  For any non-urgent questions, you may also contact your provider using MyChart. We now offer e-Visits for anyone 33 and older to request care online for non-urgent symptoms. For details visit [email or web address].   Also download the MyChart app! Go to the app store, search "MyChart", open the app, select [place], and log in with your MyChart username and password.

## 2022-10-16 ENCOUNTER — Inpatient Hospital Stay: Payer: Medicare Other

## 2022-10-16 VITALS — BP 141/59 | HR 59 | Temp 97.3°F | Resp 18 | Wt 132.6 lb

## 2022-10-16 VITALS — BP 165/84 | HR 71 | Temp 97.2°F | Resp 18

## 2022-10-16 DIAGNOSIS — R112 Nausea with vomiting, unspecified: Secondary | ICD-10-CM | POA: Diagnosis not present

## 2022-10-16 DIAGNOSIS — R197 Diarrhea, unspecified: Secondary | ICD-10-CM | POA: Diagnosis not present

## 2022-10-16 DIAGNOSIS — Z95828 Presence of other vascular implants and grafts: Secondary | ICD-10-CM

## 2022-10-16 DIAGNOSIS — C9000 Multiple myeloma not having achieved remission: Secondary | ICD-10-CM | POA: Diagnosis not present

## 2022-10-16 DIAGNOSIS — Z5112 Encounter for antineoplastic immunotherapy: Secondary | ICD-10-CM | POA: Diagnosis not present

## 2022-10-16 LAB — PHOSPHORUS: Phosphorus: 3.2 mg/dL (ref 2.5–4.6)

## 2022-10-16 LAB — COMPREHENSIVE METABOLIC PANEL
ALT: 22 U/L (ref 0–44)
AST: 19 U/L (ref 15–41)
Albumin: 2.7 g/dL — ABNORMAL LOW (ref 3.5–5.0)
Alkaline Phosphatase: 58 U/L (ref 38–126)
Anion gap: 7 (ref 5–15)
BUN: 18 mg/dL (ref 8–23)
CO2: 24 mmol/L (ref 22–32)
Calcium: 8.5 mg/dL — ABNORMAL LOW (ref 8.9–10.3)
Chloride: 102 mmol/L (ref 98–111)
Creatinine, Ser: 0.77 mg/dL (ref 0.61–1.24)
GFR, Estimated: >60 mL/min (ref >60)
Glucose, Bld: 108 mg/dL — ABNORMAL HIGH (ref 70–99)
Potassium: 3.6 mmol/L (ref 3.5–5.1)
Sodium: 133 mmol/L — ABNORMAL LOW (ref 135–145)
Total Bilirubin: 0.6 mg/dL (ref 0.3–1.2)
Total Protein: 7.6 g/dL (ref 6.5–8.1)

## 2022-10-16 LAB — CBC WITH DIFFERENTIAL/PLATELET
Abs Immature Granulocytes: 0.01 10*3/uL (ref 0.00–0.07)
Basophils Absolute: 0 10*3/uL (ref 0.0–0.1)
Basophils Relative: 0 %
Eosinophils Absolute: 0.1 10*3/uL (ref 0.0–0.5)
Eosinophils Relative: 2 %
HCT: 24.2 % — ABNORMAL LOW (ref 39.0–52.0)
Hemoglobin: 7.9 g/dL — ABNORMAL LOW (ref 13.0–17.0)
Immature Granulocytes: 0 %
Lymphocytes Relative: 30 %
Lymphs Abs: 0.8 10*3/uL (ref 0.7–4.0)
MCH: 34.8 pg — ABNORMAL HIGH (ref 26.0–34.0)
MCHC: 32.6 g/dL (ref 30.0–36.0)
MCV: 106.6 fL — ABNORMAL HIGH (ref 80.0–100.0)
Monocytes Absolute: 0.5 10*3/uL (ref 0.1–1.0)
Monocytes Relative: 19 %
Neutro Abs: 1.4 10*3/uL — ABNORMAL LOW (ref 1.7–7.7)
Neutrophils Relative %: 49 %
Platelets: 105 10*3/uL — ABNORMAL LOW (ref 150–400)
RBC: 2.27 MIL/uL — ABNORMAL LOW (ref 4.22–5.81)
WBC: 3 10*3/uL — ABNORMAL LOW (ref 4.0–10.5)
nRBC: 0 % (ref 0.0–0.2)

## 2022-10-16 LAB — MAGNESIUM: Magnesium: 2.1 mg/dL (ref 1.7–2.4)

## 2022-10-16 LAB — SAMPLE TO BLOOD BANK

## 2022-10-16 MED ORDER — ONDANSETRON HCL 4 MG/2ML IJ SOLN
8.0000 mg | Freq: Once | INTRAMUSCULAR | Status: AC
  Administered 2022-10-16: 8 mg via INTRAVENOUS
  Filled 2022-10-16: qty 4

## 2022-10-16 MED ORDER — HEPARIN SOD (PORK) LOCK FLUSH 100 UNIT/ML IV SOLN
500.0000 [IU] | Freq: Once | INTRAVENOUS | Status: AC | PRN
  Administered 2022-10-16: 500 [IU]

## 2022-10-16 MED ORDER — SODIUM CHLORIDE 0.9% FLUSH
10.0000 mL | Freq: Once | INTRAVENOUS | Status: AC
  Administered 2022-10-16: 10 mL via INTRAVENOUS

## 2022-10-16 MED ORDER — ACETAMINOPHEN 325 MG PO TABS
650.0000 mg | ORAL_TABLET | Freq: Once | ORAL | Status: AC
  Administered 2022-10-16: 650 mg via ORAL
  Filled 2022-10-16: qty 2

## 2022-10-16 MED ORDER — BORTEZOMIB CHEMO SQ INJECTION 3.5 MG (2.5MG/ML)
1.0000 mg/m2 | Freq: Once | INTRAMUSCULAR | Status: AC
  Administered 2022-10-16: 1.75 mg via SUBCUTANEOUS
  Filled 2022-10-16: qty 0.7

## 2022-10-16 MED ORDER — METHYLPREDNISOLONE SODIUM SUCC 125 MG IJ SOLR
100.0000 mg | Freq: Once | INTRAMUSCULAR | Status: AC
  Administered 2022-10-16: 100 mg via INTRAVENOUS
  Filled 2022-10-16: qty 2

## 2022-10-16 MED ORDER — SODIUM CHLORIDE 0.9% FLUSH
10.0000 mL | INTRAVENOUS | Status: DC | PRN
  Administered 2022-10-16: 10 mL

## 2022-10-16 MED ORDER — SODIUM CHLORIDE 0.9 % IV SOLN: Freq: Once | INTRAVENOUS | Status: AC

## 2022-10-16 MED ORDER — SODIUM CHLORIDE 0.9 % IV SOLN
16.0000 mg/kg | Freq: Once | INTRAVENOUS | Status: AC
  Administered 2022-10-16: 900 mg via INTRAVENOUS
  Filled 2022-10-16: qty 40

## 2022-10-16 MED ORDER — CETIRIZINE HCL 10 MG/ML IV SOLN
10.0000 mg | Freq: Once | INTRAVENOUS | Status: AC
  Administered 2022-10-16: 10 mg via INTRAVENOUS
  Filled 2022-10-16: qty 1

## 2022-10-16 NOTE — Progress Notes (Signed)
Patient presents today for Daratumumab and Velcade per providers order.  Vital signs within parameters for treatment.  Hgb noted to be 7.9, MD notified.  Message received from Kennith Gain RN/Dr. Ellin Saba patient okay for treatment and to recheck labs next week for possible blood transfusion.  Treatment given today per MD orders.  Stable during infusion without adverse affects.  Vital signs stable.  No complaints at this time.  Discharge from clinic ambulatory in stable condition.  Alert and oriented X 3.  Follow up with [facility] as scheduled.

## 2022-10-16 NOTE — Patient Instructions (Signed)
[facility] [place]  Discharge Instructions: Thank you for choosing [facility] to provide your oncology and hematology care.  If you have a lab appointment with the Cancer Center - please note that after April 8th, 2024, all labs will be drawn in the cancer center.  You do not have to check in or register with the main entrance as you have in the past but will complete your check-in in the cancer center.  Wear comfortable clothing and clothing appropriate for easy access to any Portacath or PICC line.   We strive to give you quality time with your provider. You may need to reschedule your appointment if you arrive late (15 or more minutes).  Arriving late affects you and other patients whose appointments are after yours.  Also, if you miss three or more appointments without notifying the office, you may be dismissed from the clinic at the provider's discretion.      For prescription refill requests, have your pharmacy contact our office and allow 72 hours for refills to be completed.    Today you received the following chemotherapy and/or immunotherapy agents Daratumumab/Velcade      To help prevent nausea and vomiting after your treatment, we encourage you to take your nausea medication as directed.  BELOW ARE SYMPTOMS THAT SHOULD BE REPORTED IMMEDIATELY: *FEVER GREATER THAN 100.4 F (38 C) OR HIGHER *CHILLS OR SWEATING *NAUSEA AND VOMITING THAT IS NOT CONTROLLED WITH YOUR NAUSEA MEDICATION *UNUSUAL SHORTNESS OF BREATH *UNUSUAL BRUISING OR BLEEDING *URINARY PROBLEMS (pain or burning when urinating, or frequent urination) *BOWEL PROBLEMS (unusual diarrhea, constipation, pain near the anus) TENDERNESS IN MOUTH AND THROAT WITH OR WITHOUT PRESENCE OF ULCERS (sore throat, sores in mouth, or a toothache) UNUSUAL RASH, SWELLING OR PAIN  UNUSUAL VAGINAL DISCHARGE OR ITCHING   Items with * indicate a potential emergency and should be followed up as soon as possible or  go to the Emergency Department if any problems should occur.  Please show the CHEMOTHERAPY ALERT CARD or IMMUNOTHERAPY ALERT CARD at check-in to the Emergency Department and triage nurse.  Should you have questions after your visit or need to cancel or reschedule your appointment, please contact [facility] [place] [phone]  and follow the prompts.  Office hours are 8:00 a.m. to 4:30 p.m. Monday - Friday. Please note that voicemails left after 4:00 p.m. may not be returned until the following business day.  We are closed weekends and major holidays. You have access to a nurse at all times for urgent questions. Please call the main number to the clinic [phone] and follow the prompts.  For any non-urgent questions, you may also contact your provider using MyChart. We now offer e-Visits for anyone 18 and older to request care online for non-urgent symptoms. For details visit [email or web address].   Also download the MyChart app! Go to the app store, search "MyChart", open the app, select [place], and log in with your MyChart username and password.   

## 2022-10-29 ENCOUNTER — Encounter: Payer: Self-pay | Admitting: Hematology

## 2022-10-30 ENCOUNTER — Inpatient Hospital Stay: Payer: Medicare Other

## 2022-10-30 ENCOUNTER — Inpatient Hospital Stay: Payer: Medicare Other | Admitting: Hematology

## 2022-10-30 VITALS — BP 173/64 | HR 76 | Temp 97.9°F | Resp 18 | Wt 129.2 lb

## 2022-10-30 DIAGNOSIS — C9000 Multiple myeloma not having achieved remission: Secondary | ICD-10-CM

## 2022-10-30 DIAGNOSIS — Z5112 Encounter for antineoplastic immunotherapy: Secondary | ICD-10-CM | POA: Diagnosis not present

## 2022-10-30 DIAGNOSIS — Z95828 Presence of other vascular implants and grafts: Secondary | ICD-10-CM

## 2022-10-30 DIAGNOSIS — R197 Diarrhea, unspecified: Secondary | ICD-10-CM | POA: Diagnosis not present

## 2022-10-30 DIAGNOSIS — R112 Nausea with vomiting, unspecified: Secondary | ICD-10-CM | POA: Diagnosis not present

## 2022-10-30 LAB — CBC WITH DIFFERENTIAL/PLATELET
Abs Immature Granulocytes: 0.02 10*3/uL (ref 0.00–0.07)
Basophils Absolute: 0 10*3/uL (ref 0.0–0.1)
Basophils Relative: 0 %
Eosinophils Absolute: 0.1 10*3/uL (ref 0.0–0.5)
Eosinophils Relative: 1 %
HCT: 25.9 % — ABNORMAL LOW (ref 39.0–52.0)
Hemoglobin: 8.5 g/dL — ABNORMAL LOW (ref 13.0–17.0)
Immature Granulocytes: 0 %
Lymphocytes Relative: 9 %
Lymphs Abs: 0.7 10*3/uL (ref 0.7–4.0)
MCH: 36 pg — ABNORMAL HIGH (ref 26.0–34.0)
MCHC: 32.8 g/dL (ref 30.0–36.0)
MCV: 109.7 fL — ABNORMAL HIGH (ref 80.0–100.0)
Monocytes Absolute: 0.8 10*3/uL (ref 0.1–1.0)
Monocytes Relative: 10 %
Neutro Abs: 6.2 10*3/uL (ref 1.7–7.7)
Neutrophils Relative %: 80 %
Platelets: 156 10*3/uL (ref 150–400)
RBC: 2.36 MIL/uL — ABNORMAL LOW (ref 4.22–5.81)
WBC: 7.8 10*3/uL (ref 4.0–10.5)
nRBC: 0 % (ref 0.0–0.2)

## 2022-10-30 LAB — COMPREHENSIVE METABOLIC PANEL
ALT: 20 U/L (ref 0–44)
AST: 19 U/L (ref 15–41)
Albumin: 3.2 g/dL — ABNORMAL LOW (ref 3.5–5.0)
Alkaline Phosphatase: 64 U/L (ref 38–126)
Anion gap: 9 (ref 5–15)
BUN: 24 mg/dL — ABNORMAL HIGH (ref 8–23)
CO2: 23 mmol/L (ref 22–32)
Calcium: 9.1 mg/dL (ref 8.9–10.3)
Chloride: 102 mmol/L (ref 98–111)
Creatinine, Ser: 0.86 mg/dL (ref 0.61–1.24)
GFR, Estimated: >60 mL/min (ref >60)
Glucose, Bld: 114 mg/dL — ABNORMAL HIGH (ref 70–99)
Potassium: 3.8 mmol/L (ref 3.5–5.1)
Sodium: 134 mmol/L — ABNORMAL LOW (ref 135–145)
Total Bilirubin: 0.3 mg/dL (ref 0.3–1.2)
Total Protein: 8.5 g/dL — ABNORMAL HIGH (ref 6.5–8.1)

## 2022-10-30 LAB — SAMPLE TO BLOOD BANK

## 2022-10-30 LAB — MAGNESIUM: Magnesium: 2.1 mg/dL (ref 1.7–2.4)

## 2022-10-30 MED ORDER — POTASSIUM CHLORIDE IN NACL 20-0.9 MEQ/L-% IV SOLN
Freq: Once | INTRAVENOUS | Status: AC
  Filled 2022-10-30: qty 1000

## 2022-10-30 MED ORDER — SODIUM CHLORIDE 0.9% FLUSH
10.0000 mL | INTRAVENOUS | Status: DC | PRN
  Administered 2022-10-30: 10 mL via INTRAVENOUS

## 2022-10-30 MED ORDER — HEPARIN SOD (PORK) LOCK FLUSH 100 UNIT/ML IV SOLN
500.0000 [IU] | Freq: Once | INTRAVENOUS | Status: AC
  Administered 2022-10-30: 500 [IU] via INTRAVENOUS

## 2022-10-30 MED ORDER — ONDANSETRON HCL 4 MG/2ML IJ SOLN
8.0000 mg | Freq: Once | INTRAMUSCULAR | Status: AC
  Administered 2022-10-30: 8 mg via INTRAVENOUS
  Filled 2022-10-30: qty 4

## 2022-10-30 MED ORDER — LOPERAMIDE HCL 2 MG PO CAPS
4.0000 mg | ORAL_CAPSULE | Freq: Once | ORAL | Status: AC
  Administered 2022-10-30: 4 mg via ORAL
  Filled 2022-10-30: qty 2

## 2022-10-30 MED ORDER — MAGNESIUM SULFATE 2 GM/50ML IV SOLN
2.0000 g | Freq: Once | INTRAVENOUS | Status: AC
  Administered 2022-10-30: 2 g via INTRAVENOUS
  Filled 2022-10-30: qty 50

## 2022-10-30 NOTE — Progress Notes (Signed)
Patient presents today chemotherapy treatment per provider's order. Pt c/o diarrhea, nausea, weakness, and vomited 250-300 mL while in the clinic today. Dr.K made aware and stated to hold treatment today. Pt will receive house fluids, Zofran 8 mg IV, and Imodium 4 mg p.o x 1 dose per Dr.K.  Discharged from clinic via wheelchair in stable condition. Alert and oriented x 3. F/U with [facility] as scheduled.

## 2022-10-30 NOTE — Patient Instructions (Signed)
[facility]  Discharge Instructions: Thank you for choosing [facility] to provide your oncology and hematology care.  If you have a lab appointment with the Cancer Center - please note that after April 8th, 2024, all labs will be drawn in the cancer center.  You do not have to check in or register with the main entrance as you have in the past but will complete your check-in in the cancer center.  Wear comfortable clothing and clothing appropriate for easy access to any Portacath or PICC line.   We strive to give you quality time with your provider. You may need to reschedule your appointment if you arrive late (15 or more minutes).  Arriving late affects you and other patients whose appointments are after yours.  Also, if you miss three or more appointments without notifying the office, you may be dismissed from the clinic at the provider's discretion.      For prescription refill requests, have your pharmacy contact our office and allow 72 hours for refills to be completed.    Today you received house fluids, zofran IV and imodium p.o     BELOW ARE SYMPTOMS THAT SHOULD BE REPORTED IMMEDIATELY: *FEVER GREATER THAN 100.4 F (38 C) OR HIGHER *CHILLS OR SWEATING *NAUSEA AND VOMITING THAT IS NOT CONTROLLED WITH YOUR NAUSEA MEDICATION *UNUSUAL SHORTNESS OF BREATH *UNUSUAL BRUISING OR BLEEDING *URINARY PROBLEMS (pain or burning when urinating, or frequent urination) *BOWEL PROBLEMS (unusual diarrhea, constipation, pain near the anus) TENDERNESS IN MOUTH AND THROAT WITH OR WITHOUT PRESENCE OF ULCERS (sore throat, sores in mouth, or a toothache) UNUSUAL RASH, SWELLING OR PAIN  UNUSUAL VAGINAL DISCHARGE OR ITCHING   Items with * indicate a potential emergency and should be followed up as soon as possible or go to the Emergency Department if any problems should occur.  Please show the CHEMOTHERAPY ALERT CARD or IMMUNOTHERAPY ALERT CARD at check-in to the  Emergency Department and triage nurse.  Should you have questions after your visit or need to cancel or reschedule your appointment, please contact [facility] [phone]  and follow the prompts.  Office hours are 8:00 a.m. to 4:30 p.m. Monday - Friday. Please note that voicemails left after 4:00 p.m. may not be returned until the following business day.  We are closed weekends and major holidays. You have access to a nurse at all times for urgent questions. Please call the main number to the clinic [phone] and follow the prompts.  For any non-urgent questions, you may also contact your provider using MyChart. We now offer e-Visits for anyone 63 and older to request care online for non-urgent symptoms. For details visit [email or web address].   Also download the MyChart app! Go to the app store, search "MyChart", open the app, select [place], and log in with your MyChart username and password.

## 2022-10-31 LAB — KAPPA/LAMBDA LIGHT CHAINS
Kappa free light chain: 3.5 mg/L (ref 3.3–19.4)
Kappa, lambda light chain ratio: 0.01 — ABNORMAL LOW (ref 0.26–1.65)
Lambda free light chains: 527.4 mg/L — ABNORMAL HIGH (ref 5.7–26.3)

## 2022-11-01 LAB — PROTEIN ELECTROPHORESIS, SERUM
A/G Ratio: 0.7 (ref 0.7–1.7)
Albumin ELP: 3.5 g/dL (ref 2.9–4.4)
Alpha-1-Globulin: 0.3 g/dL (ref 0.0–0.4)
Alpha-2-Globulin: 0.8 g/dL (ref 0.4–1.0)
Beta Globulin: 0.7 g/dL (ref 0.7–1.3)
Gamma Globulin: 3 g/dL — ABNORMAL HIGH (ref 0.4–1.8)
Globulin, Total: 4.8 g/dL — ABNORMAL HIGH (ref 2.2–3.9)
M-Spike, %: 2.9 g/dL — ABNORMAL HIGH
Total Protein ELP: 8.3 g/dL (ref 6.0–8.5)

## 2022-11-04 ENCOUNTER — Other Ambulatory Visit: Payer: Self-pay

## 2022-11-05 NOTE — Progress Notes (Signed)
[facility] [address] [place] [address]    Clinic Day:  11/06/2022  Referring physician: Doreatha Massed, MD  Patient Care Team: Pcp, No as PCP - General Lennette Bihari, MD as PCP - Cardiology (Cardiology) Lennette Bihari, MD as Consulting Physician (Cardiology) Jena Gauss Gerrit Friends, MD as Consulting Physician (Gastroenterology) Doreatha Massed, MD as Medical Oncologist (Oncology)   ASSESSMENT & PLAN:   Assessment: 1.  IgG lambda plasma cell myeloma: - Work-up for macrocytic anemia on 12/01/2020 showed M spike of 2.9 g.  Immunofixation IgG lambda. - Lambda light chains elevated at 284.  Light chain ratio was 0.04.  LDH was 163.  Creatinine was 0.9 and calcium 8.9. - Bone marrow biopsy on 12/18/2020-hypercellular marrow for age with 48% atypical plasma cells with lambda light chain restriction. - Chromosome analysis was normal. 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 - Revlimid 5 mg, 2 weeks on/1 week off started on 01/20/2021.  Dexamethasone weekly 10 mg added on 02/07/2021.  Revlimid is discontinued around 06/15/2021 due to poor tolerance and ineffectiveness at low-dose.  Thrombocytopenia and anemia. - Velcade weekly on days 1, 8, 15 every 21 days along with dexamethasone 10 mg weekly started on 06/25/2021.  2.  Macrocytic anemia: - CBC on 12/01/2020 with hemoglobin 8.5 and MCV of 117. - Denies any bleeding per rectum or melena.   3.  Social/family history: - Lives at home with his wife.  He does all ADLs and IADLs.  He even does yard work. - He worked at [employer] for 35 years.  Denies any chemical exposure.  Non-smoker. - No family history of malignancies.     Plan: 1.  IgG lambda plasma cell myeloma, high risk: - He is tolerating weekly Velcade and Darzalex very well.  Cycle 8 was on 10/02/2022. - Last week his treatment was held on 10/30/2022 because of diarrhea.   He no longer has diarrhea and is feeling better. - Reviewed myeloma labs from 10/30/2022: M spike increased to 2.9 from 2.4 g.  Lambda light chains are 527 with ratio of 0.01. - Labs today: White count 3.9, hemoglobin 8 and platelet count 122.  Creatinine and calcium are normal.  Proceed with cycle 9 today with Velcade and Darzalex 3 weeks on/1 week off.  Repeat myeloma labs in 3 weeks and RTC 4 weeks.  2.  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  Will continue CBC weekly and transfuse to keep hemoglobin above 8.  3.  Hypophosphatemia: - Phosphate today is normal at 3.4.  Phosphate supplements discontinued.  4.  Right ankle swelling: - 2+ ankle edema present.  Continue Lasix as needed.  5.  ID prophylaxis: - Continue acyclovir twice daily and aspirin 81 mg 3 times weekly.    Orders Placed This Encounter  Procedures   Comprehensive metabolic panel    Standing Status:   Future    Standing Expiration Date:   02/26/2024   Magnesium    Standing Status:   Future    Standing Expiration Date:   02/26/2024   CBC with Differential    Standing Status:   Future    Standing Expiration Date:   02/26/2024   Kappa/lambda light chains    Standing Status:   Standing    Number of Occurrences:   10    Standing Expiration Date:   11/06/2023   Protein  electrophoresis, serum    Standing Status:   Standing    Number of Occurrences:   10    Standing Expiration Date:   11/06/2023      I,Katie Daubenspeck,acting as a scribe for Doreatha Massed, MD.,have documented all relevant documentation on the behalf of Doreatha Massed, MD,as directed by  Doreatha Massed, MD while in the presence of Doreatha Massed, MD.   I, Doreatha Massed MD, have reviewed the above documentation for accuracy and completeness, and I agree with the above.   Doreatha Massed, MD   7/3/20247:01 PM  CHIEF COMPLAINT:   Diagnosis: multiple myeloma and anemia    Cancer  Staging  No matching staging information was found for the patient.   Prior Therapy: none  Current Therapy:  Weekly Velcade and daratumumab    HISTORY OF PRESENT ILLNESS:   Oncology History  Multiple myeloma (HCC)  01/15/2021 Initial Diagnosis   Multiple myeloma (HCC)   06/25/2021 - 12/06/2021 Chemotherapy   Patient is on Treatment Plan : MYELOMA NON-TRANSPLANT CANDIDATES VRd weekly q21d     12/20/2021 -  Chemotherapy   Patient is on Treatment Plan : MYELOMA Daratumumab IV q28d        INTERVAL HISTORY:   Cromwell is a 89 y.o. male presenting to clinic today for follow up of multiple myeloma and anemia. He was last seen by me on 10/02/22.  Today, he states that he is doing well overall. His appetite level is at 75%. His energy level is at 75%.  PAST MEDICAL HISTORY:   Past Medical History: Past Medical History:  Diagnosis Date   Allergic rhinitis    Anal fissure    Anginal pain (HCC)    Asthma    Cancer (HCC)    skin   Chest pain    CHF (congestive heart failure) (HCC)    Coronary heart disease    s/p stenting. cath in 01/2012 noncritical occlusion   Dyspnea    Dysrhythmia    1st degree heart block   GERD (gastroesophageal reflux disease)    Glaucoma    Hiatal hernia    Hyperlipidemia    Hypertension    Hypothyroidism    Idiopathic thrombocytopenic purpura (HCC) 2002   Macular degeneration    Multiple myeloma (HCC) 01/15/2021   Nephrolithiasis    PUD (peptic ulcer disease)    remote   Sarcoidosis    pulmonary   Schatzki's ring     Surgical History: Past Surgical History:  Procedure Laterality Date   cardiac stents     COLONOSCOPY  10/30/2006   Normal rectum, sigmoid diverticula.Remainder of colonic mucosa appeared normal.   CORONARY ANGIOPLASTY WITH STENT PLACEMENT     about 10 years ago per pt (around 2007)   CYSTOSCOPY WITH RETROGRADE PYELOGRAM, URETEROSCOPY AND STENT PLACEMENT Left 06/16/2017   Procedure: CYSTOSCOPY WITH RETROGRADE PYELOGRAM,  URETEROSCOPY,STONE EXTRACTION  AND STENT PLACEMENT;  Surgeon: Marcine Matar, MD;  Location: [facility];  Service: Urology;  Laterality: Left;   ESOPHAGOGASTRODUODENOSCOPY  06/19/2004   Two esophageal rings and esophageal web as described above.  All of these were disrupted by passing 56-French Elease Hashimoto dilator/ Candida esophagitis,which appears to be incidental given history of   antibiotic use, but nevertheless will be treated.   ESOPHAGOGASTRODUODENOSCOPY  10/30/2006   Distal tandem esophageal ring status post dilation disruption as  described above.  Otherwise normal esophagus/  Small hiatal hernia otherwise normal stomach, D1 and D2   ESOPHAGOGASTRODUODENOSCOPY N/A 03/22/2015   Dr.Rourk- noncritical schatzki's ring and  hiatal hernia-o/w normal EGD.    ESOPHAGOGASTRODUODENOSCOPY (EGD) WITH ESOPHAGEAL DILATION  03/04/2012   RMR- schatzki's ring, hiatal hernia, polypoid gastric mucosa, bx= minimally active gastritis.   HOLMIUM LASER APPLICATION Left 06/16/2017   Procedure: HOLMIUM LASER APPLICATION;  Surgeon: Marcine Matar, MD;  Location: [facility];  Service: Urology;  Laterality: Left;   IR CV LINE INJECTION  12/27/2021   IR GENERIC HISTORICAL  03/06/2016   IR RADIOLOGIST EVAL & MGMT 03/06/2016 Irish Lack, MD [facility] INTERV RAD   IR GENERIC HISTORICAL  06/18/2016   IR RADIOLOGIST EVAL & MGMT 06/18/2016 Irish Lack, MD [facility] INTER[facility] RAD   IR IMAGING GUIDED PORT INSERTION  12/18/2021   IR IMAGING GUIDED PORT INSERTION  01/15/2022   IR RADIOLOGIST EVAL & MGMT  10/01/2016   IR RADIOLOGIST EVAL & MGMT  10/15/2017   IR RADIOLOGIST EVAL & MGMT  12/24/2018   IR RADIOLOGIST EVAL & MGMT  01/04/2020   IR RADIOLOGIST EVAL & MGMT  06/20/2021   IR REMOVAL TUN ACCESS W/ PORT W/O FL MOD SED  01/15/2022   LEFT HEART CATH N/A 02/02/2012   Procedure: LEFT HEART CATH;  Surgeon: Runell Gess, MD;  Location: [facility] CATH LAB;  Service: Cardiovascular;  Laterality: N/A;   MEDIASTINOSCOPY     for dx sarcoid    RADIOLOGY WITH ANESTHESIA Left 05/17/2016   Procedure: left renal ablation;  Surgeon: Irish Lack, MD;  Location: [facility];  Service: Radiology;  Laterality: Left;    Social History: 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    Employer: RETIRED  Tobacco Use   Smoking status: Former    Packs/day: 0.10    Years: 2.00    Additional pack years: 0.00    Total pack years: 0.20    Types: Cigarettes, Cigars    Quit date: 05/06/1970    Years since quitting: 52.5   Smokeless tobacco: Never  Vaping Use   Vaping Use: Never used  Substance and Sexual Activity   Alcohol use: No    Alcohol/week: 0.0 standard drinks of alcohol   Drug use: No   Sexual activity: Never  Other Topics Concern   Not on file  Social History Narrative   Not on file   Social Determinants of Health   Financial Resource Strain: Not on file  Food Insecurity: No Food Insecurity (06/04/2022)   Hunger Vital Sign    Worried About Running Out of Food in the Last Year: Never true    Ran Out of Food in the Last Year: Never true  Transportation Needs: No Transportation Needs (06/04/2022)   PRAPARE - Administrator, Civil Service (Medical): No    Lack of Transportation (Non-Medical): No  Physical Activity: Not on file  Stress: Not on file  Social Connections: Not on file  Intimate Partner Violence: Not At Risk (06/04/2022)   Humiliation, Afraid, Rape, and Kick questionnaire    Fear of Current or Ex-Partner: No    Emotionally Abused: No    Physically Abused: No    Sexually Abused: No    Family History: Family History  Problem Relation Age of Onset   Heart disease Father        deceased age 57  Stroke Mother    Alzheimer's disease Mother    Heart attack Brother         deceased age 57   Cancer Other        niece   Colon cancer Neg Hx     Current Medications:  Current Outpatient Medications:    acetaminophen (TYLENOL) 325 MG tablet, Take 2 tablets (650 mg total) by mouth every 6 (six) hours as needed for mild pain (or Fever >/= 101)., Disp: , Rfl:    acyclovir (ZOVIRAX) 400 MG tablet, TAKE 1 TABLET BY MOUTH TWICE  DAILY, Disp: 180 tablet, Rfl: 0   Ascorbic Acid (VITAMIN C PO), Take 500 mg by mouth every evening., Disp: , Rfl:    aspirin EC 81 MG tablet, Take 1 tablet (81 mg total) by mouth daily with breakfast. For 30 days Only (Patient taking differently: Take 81 mg by mouth daily with breakfast. Every Monday, Wednesday, and Friday), Disp: 30 tablet, Rfl: 0   atorvastatin (LIPITOR) 40 MG tablet, TAKE 1 TABLET BY MOUTH IN THE  EVENING, Disp: 90 tablet, Rfl: 1   Bortezomib (VELCADE IJ), Inject as directed once a week., Disp: , Rfl:    brimonidine (ALPHAGAN) 0.2 % ophthalmic solution, Place 1 drop into the left eye 2 (two) times daily., Disp: , Rfl:    cyanocobalamin 1000 MCG tablet, Take 1 tablet (1,000 mcg total) by mouth daily., Disp: 30 tablet, Rfl: 3   dexamethasone (DECADRON) 2 MG tablet, TAKE 5 TABLETS BY MOUTH ONCE  WEEKLY, Disp: 60 tablet, Rfl: 0   dorzolamide-timolol (COSOPT) 22.3-6.8 MG/ML ophthalmic solution, Place 1 drop into the left eye 2 (two) times daily., Disp: , Rfl:    feeding supplement (ENSURE ENLIVE / ENSURE PLUS) LIQD, Take 237 mLs by mouth 3 (three) times daily between meals., Disp: , Rfl:    furosemide (LASIX) 20 MG tablet, TAKE 2 TABLETS BY MOUTH DAILY, IF YOU NOTICE ANY SWELLING., Disp: 90 tablet, Rfl: 1   isosorbide mononitrate (IMDUR) 60 MG 24 hr tablet, TAKE 1 AND 1/2 TABLETS BY MOUTH  IN THE MORNING AND ONE-HALF  TABLET BY MOUTH AT NIGHT, Disp: 180 tablet, Rfl: 2   levothyroxine (SYNTHROID) 75 MCG tablet, Take 75 mcg by mouth daily before breakfast., Disp: , Rfl:    lidocaine-prilocaine (EMLA) cream, Apply 1 Application topically  as needed., Disp: 30 g, Rfl: 1   losartan (COZAAR) 100 MG tablet, TAKE 1 TABLET BY MOUTH DAILY (Patient taking differently: Take 100 mg by mouth daily.), Disp: 90 tablet, Rfl: 3   metoprolol tartrate (LOPRESSOR) 25 MG tablet, TAKE 1 TABLET BY MOUTH IN THE  MORNING AND ONE-HALF TABLET BY  MOUTH IN THE EVENING, Disp: 135 tablet, Rfl: 3   Multiple Vitamins-Minerals (PRESERVISION/LUTEIN) CAPS, Take 1 capsule by mouth 2 (two) times daily., Disp: , Rfl:    nitroGLYCERIN (NITROSTAT) 0.4 MG SL tablet, Place 1 tablet (0.4 mg total) under the tongue every 5 (five) minutes as needed. For chest pain, Disp: 25 tablet, Rfl: 2   pantoprazole (PROTONIX) 40 MG tablet, TAKE 1 TABLET BY MOUTH DAILY  BEFORE BREAKFAST (Patient taking differently: Take 40 mg by mouth daily.), Disp: 90 tablet, Rfl: 3   potassium chloride SA (KLOR-CON M) 20 MEQ tablet, Take 20 mEq by mouth daily., Disp: , Rfl:    prochlorperazine (COMPAZINE) 10 MG tablet, Take 1 tablet (10 mg total) by mouth every 6 (six) hours as needed for nausea or vomiting., Disp: 30 tablet, Rfl: 3   ROCKLATAN 0.02-0.005 %  SOLN, Place 1 drop into the left eye at bedtime., Disp: , Rfl:    triamcinolone (KENALOG) 0.1 % cream, Apply 1 application. topically 2 (two) times daily as needed (for irritation)., Disp: , Rfl:    vitamin E 200 UNIT capsule, Take 200 Units by mouth every evening., Disp: , Rfl:  No current facility-administered medications for this visit.  Facility-Administered Medications Ordered in Other Visits:    acetaminophen (TYLENOL) 325 MG tablet, , , ,    diphenhydrAMINE (BENADRYL) 25 mg capsule, , , ,    montelukast (SINGULAIR) 10 MG tablet, , , ,    Allergies: Allergies  Allergen Reactions   Azithromycin Other (See Comments)    Sore mouth and fever blisters around mouth, sores in nose area as well   Doxazosin Shortness Of Breath   Atenolol Other (See Comments)    UNKNOWN REACTION   Hydrocodone Nausea And Vomiting   Levofloxacin Other (See  Comments)    Caused stomach problems.   Morphine Other (See Comments)    "made me crazy"   Penicillins Nausea And Vomiting and Other (See Comments)    Has patient had a PCN reaction causing immediate rash, facial/tongue/throat swelling, SOB or lightheadedness with hypotension: No Has patient had a PCN reaction causing severe rash involving mucus membranes or skin necrosis: No Has patient had a PCN reaction that required hospitalization No Has patient had a PCN reaction occurring within the last 10 years: No If all of the above answers are "NO", then may proceed with Cephalosporin use.    Sulfonamide Derivatives Nausea And Vomiting    REVIEW OF SYSTEMS:   Review of Systems  Constitutional:  Negative for chills, fatigue and fever.  HENT:   Negative for lump/mass, mouth sores, nosebleeds, sore throat and trouble swallowing.   Eyes:  Negative for eye problems.  Respiratory:  Negative for cough and shortness of breath.   Cardiovascular:  Negative for chest pain, leg swelling and palpitations.  Gastrointestinal:  Positive for diarrhea. Negative for abdominal pain, constipation, nausea and vomiting.  Genitourinary:  Negative for bladder incontinence, difficulty urinating, dysuria, frequency, hematuria and nocturia.   Musculoskeletal:  Negative for arthralgias, back pain, flank pain, myalgias and neck pain.  Skin:  Negative for itching and rash.  Neurological:  Negative for dizziness, headaches and numbness.  Hematological:  Does not bruise/bleed easily.  Psychiatric/Behavioral:  Negative for depression, sleep disturbance and suicidal ideas. The patient is not nervous/anxious.   All other systems reviewed and are negative.    VITALS:   There were no vitals taken for this visit.  Wt Readings from Last 3 Encounters:  11/06/22 129 lb 6.4 oz (58.7 kg)  10/30/22 129 lb 3.2 oz (58.6 kg)  10/16/22 132 lb 9.6 oz (60.1 kg)    There is no height or weight on file to calculate  BMI.  Performance status (ECOG): 1 - Symptomatic but completely ambulatory  PHYSICAL EXAM:   Physical Exam Vitals and nursing note reviewed. Exam conducted with a chaperone present.  Constitutional:      Appearance: Normal appearance.  Cardiovascular:     Rate and Rhythm: Normal rate and regular rhythm.     Pulses: Normal pulses.     Heart sounds: Normal heart sounds.  Pulmonary:     Effort: Pulmonary effort is normal.     Breath sounds: Normal breath sounds.  Abdominal:     Palpations: Abdomen is soft. There is no hepatomegaly, splenomegaly or mass.     Tenderness: There is  no abdominal tenderness.  Musculoskeletal:     Right lower leg: No edema.     Left lower leg: No edema.  Lymphadenopathy:     Cervical: No cervical adenopathy.     Right cervical: No superficial, deep or posterior cervical adenopathy.    Left cervical: No superficial, deep or posterior cervical adenopathy.     Upper Body:     Right upper body: No supraclavicular or axillary adenopathy.     Left upper body: No supraclavicular or axillary adenopathy.  Neurological:     General: No focal deficit present.     Mental Status: He is alert and oriented to person, place, and time.  Psychiatric:        Mood and Affect: Mood normal.        Behavior: Behavior normal.     LABS:      Latest Ref Rng & Units 11/06/2022    8:12 AM 10/30/2022    8:33 AM 10/16/2022    7:29 AM  CBC  WBC 4.0 - 10.5 K/uL 3.9  7.8  3.0   Hemoglobin 13.0 - 17.0 g/dL 8.0  8.5  7.9   Hematocrit 39.0 - 52.0 % 24.3  25.9  24.2   Platelets 150 - 400 K/uL 122  156  105       Latest Ref Rng & Units 11/06/2022    8:12 AM 10/30/2022    8:33 AM 10/16/2022    7:29 AM  CMP  Glucose 70 - 99 mg/dL 782  956  213   BUN 8 - 23 mg/dL 17  24  18    Creatinine 0.61 - 1.24 mg/dL 0.86  5.78  4.69   Sodium 135 - 145 mmol/L 133  134  133   Potassium 3.5 - 5.1 mmol/L 3.5  3.8  3.6   Chloride 98 - 111 mmol/L 104  102  102   CO2 22 - 32 mmol/L 23  23  24     Calcium 8.9 - 10.3 mg/dL 8.8  9.1  8.5   Total Protein 6.5 - 8.1 g/dL 8.2  8.5  7.6   Total Bilirubin 0.3 - 1.2 mg/dL 0.6  0.3  0.6   Alkaline Phos 38 - 126 U/L 67  64  58   AST 15 - 41 U/L 20  19  19    ALT 0 - 44 U/L 20  20  22       No results found for: "CEA1", "CEA" / No results found for: "CEA1", "CEA" No results found for: "PSA1" No results found for: "CAN199" No results found for: "CAN125"  Lab Results  Component Value Date   TOTALPROTELP 8.3 10/30/2022   ALBUMINELP 3.5 10/30/2022   A1GS 0.3 10/30/2022   A2GS 0.8 10/30/2022   BETS 0.7 10/30/2022   GAMS 3.0 (H) 10/30/2022   MSPIKE 2.9 (H) 10/30/2022   SPEI Comment 10/30/2022   Lab Results  Component Value Date   TIBC 257 08/23/2021   TIBC 298 02/07/2021   TIBC 263 05/27/2017   FERRITIN 1,415 (H) 01/24/2022   FERRITIN 939 (H) 08/23/2021   FERRITIN 123 02/07/2021   IRONPCTSAT 90 (H) 08/23/2021   IRONPCTSAT 17 (L) 02/07/2021   IRONPCTSAT 44 05/27/2017   Lab Results  Component Value Date   LDH 115 05/01/2021   LDH 120 04/09/2021   LDH 124 04/02/2021     STUDIES:   No results found.

## 2022-11-06 ENCOUNTER — Inpatient Hospital Stay: Payer: Medicare Other

## 2022-11-06 ENCOUNTER — Encounter: Payer: Self-pay | Admitting: Hematology

## 2022-11-06 ENCOUNTER — Inpatient Hospital Stay: Payer: Medicare Other | Attending: Hematology | Admitting: Hematology

## 2022-11-06 VITALS — BP 167/60 | HR 73 | Temp 97.7°F | Resp 18

## 2022-11-06 DIAGNOSIS — Z5112 Encounter for antineoplastic immunotherapy: Secondary | ICD-10-CM | POA: Insufficient documentation

## 2022-11-06 DIAGNOSIS — C9000 Multiple myeloma not having achieved remission: Secondary | ICD-10-CM

## 2022-11-06 DIAGNOSIS — D6481 Anemia due to antineoplastic chemotherapy: Secondary | ICD-10-CM | POA: Insufficient documentation

## 2022-11-06 DIAGNOSIS — D630 Anemia in neoplastic disease: Secondary | ICD-10-CM | POA: Insufficient documentation

## 2022-11-06 LAB — CBC WITH DIFFERENTIAL/PLATELET
Abs Immature Granulocytes: 0.01 10*3/uL (ref 0.00–0.07)
Basophils Absolute: 0 10*3/uL (ref 0.0–0.1)
Basophils Relative: 0 %
Eosinophils Absolute: 0.2 10*3/uL (ref 0.0–0.5)
Eosinophils Relative: 4 %
HCT: 24.3 % — ABNORMAL LOW (ref 39.0–52.0)
Hemoglobin: 8 g/dL — ABNORMAL LOW (ref 13.0–17.0)
Immature Granulocytes: 0 %
Lymphocytes Relative: 23 %
Lymphs Abs: 0.9 10*3/uL (ref 0.7–4.0)
MCH: 36.4 pg — ABNORMAL HIGH (ref 26.0–34.0)
MCHC: 32.9 g/dL (ref 30.0–36.0)
MCV: 110.5 fL — ABNORMAL HIGH (ref 80.0–100.0)
Monocytes Absolute: 0.7 10*3/uL (ref 0.1–1.0)
Monocytes Relative: 17 %
Neutro Abs: 2.1 10*3/uL (ref 1.7–7.7)
Neutrophils Relative %: 56 %
Platelets: 122 10*3/uL — ABNORMAL LOW (ref 150–400)
RBC: 2.2 MIL/uL — ABNORMAL LOW (ref 4.22–5.81)
WBC: 3.9 10*3/uL — ABNORMAL LOW (ref 4.0–10.5)
nRBC: 0 % (ref 0.0–0.2)

## 2022-11-06 LAB — COMPREHENSIVE METABOLIC PANEL
ALT: 20 U/L (ref 0–44)
AST: 20 U/L (ref 15–41)
Albumin: 2.8 g/dL — ABNORMAL LOW (ref 3.5–5.0)
Alkaline Phosphatase: 67 U/L (ref 38–126)
Anion gap: 6 (ref 5–15)
BUN: 17 mg/dL (ref 8–23)
CO2: 23 mmol/L (ref 22–32)
Calcium: 8.8 mg/dL — ABNORMAL LOW (ref 8.9–10.3)
Chloride: 104 mmol/L (ref 98–111)
Creatinine, Ser: 0.85 mg/dL (ref 0.61–1.24)
GFR, Estimated: >60 mL/min (ref >60)
Glucose, Bld: 101 mg/dL — ABNORMAL HIGH (ref 70–99)
Potassium: 3.5 mmol/L (ref 3.5–5.1)
Sodium: 133 mmol/L — ABNORMAL LOW (ref 135–145)
Total Bilirubin: 0.6 mg/dL (ref 0.3–1.2)
Total Protein: 8.2 g/dL — ABNORMAL HIGH (ref 6.5–8.1)

## 2022-11-06 LAB — SAMPLE TO BLOOD BANK

## 2022-11-06 LAB — PHOSPHORUS: Phosphorus: 3.4 mg/dL (ref 2.5–4.6)

## 2022-11-06 LAB — MAGNESIUM: Magnesium: 2 mg/dL (ref 1.7–2.4)

## 2022-11-06 MED ORDER — ACETAMINOPHEN 325 MG PO TABS
650.0000 mg | ORAL_TABLET | Freq: Once | ORAL | Status: AC
  Administered 2022-11-06: 650 mg via ORAL
  Filled 2022-11-06: qty 2

## 2022-11-06 MED ORDER — SODIUM CHLORIDE 0.9% FLUSH
10.0000 mL | INTRAVENOUS | Status: DC | PRN
  Administered 2022-11-06: 10 mL

## 2022-11-06 MED ORDER — SODIUM CHLORIDE 0.9 % IV SOLN
16.0000 mg/kg | Freq: Once | INTRAVENOUS | Status: AC
  Administered 2022-11-06: 900 mg via INTRAVENOUS
  Filled 2022-11-06: qty 40

## 2022-11-06 MED ORDER — HEPARIN SOD (PORK) LOCK FLUSH 100 UNIT/ML IV SOLN
500.0000 [IU] | Freq: Once | INTRAVENOUS | Status: AC | PRN
  Administered 2022-11-06: 500 [IU]

## 2022-11-06 MED ORDER — SODIUM CHLORIDE 0.9 % IV SOLN: Freq: Once | INTRAVENOUS | Status: AC

## 2022-11-06 MED ORDER — CETIRIZINE HCL 10 MG/ML IV SOLN
5.0000 mg | Freq: Once | INTRAVENOUS | Status: AC
  Administered 2022-11-06: 5 mg via INTRAVENOUS
  Filled 2022-11-06: qty 1

## 2022-11-06 MED ORDER — ONDANSETRON HCL 4 MG/2ML IJ SOLN
8.0000 mg | Freq: Once | INTRAMUSCULAR | Status: AC
  Administered 2022-11-06: 8 mg via INTRAVENOUS
  Filled 2022-11-06: qty 4

## 2022-11-06 MED ORDER — METHYLPREDNISOLONE SODIUM SUCC 125 MG IJ SOLR
100.0000 mg | Freq: Once | INTRAMUSCULAR | Status: AC
  Administered 2022-11-06: 100 mg via INTRAVENOUS
  Filled 2022-11-06: qty 2

## 2022-11-06 MED ORDER — BORTEZOMIB CHEMO SQ INJECTION 3.5 MG (2.5MG/ML)
1.0000 mg/m2 | Freq: Once | INTRAMUSCULAR | Status: AC
  Administered 2022-11-06: 1.75 mg via SUBCUTANEOUS
  Filled 2022-11-06: qty 0.7

## 2022-11-06 NOTE — Progress Notes (Signed)
Decrease cetirizine to 5 mg IV due to patients age and tolerating treatment well.  V.O. Dr Carilyn Goodpasture, PharmD

## 2022-11-06 NOTE — Patient Instructions (Signed)
[facility]  Discharge Instructions: Thank you for choosing [facility] to provide your oncology and hematology care.  If you have a lab appointment with the Cancer Center - please note that after April 8th, 2024, all labs will be drawn in the cancer center.  You do not have to check in or register with the main entrance as you have in the past but will complete your check-in in the cancer center.  Wear comfortable clothing and clothing appropriate for easy access to any Portacath or PICC line.   We strive to give you quality time with your provider. You may need to reschedule your appointment if you arrive late (15 or more minutes).  Arriving late affects you and other patients whose appointments are after yours.  Also, if you miss three or more appointments without notifying the office, you may be dismissed from the clinic at the provider's discretion.      For prescription refill requests, have your pharmacy contact our office and allow 72 hours for refills to be completed.    Today you received the following chemotherapy and/or immunotherapy agents Daratumumab/Velcade      To help prevent nausea and vomiting after your treatment, we encourage you to take your nausea medication as directed.  BELOW ARE SYMPTOMS THAT SHOULD BE REPORTED IMMEDIATELY: *FEVER GREATER THAN 100.4 F (38 C) OR HIGHER *CHILLS OR SWEATING *NAUSEA AND VOMITING THAT IS NOT CONTROLLED WITH YOUR NAUSEA MEDICATION *UNUSUAL SHORTNESS OF BREATH *UNUSUAL BRUISING OR BLEEDING *URINARY PROBLEMS (pain or burning when urinating, or frequent urination) *BOWEL PROBLEMS (unusual diarrhea, constipation, pain near the anus) TENDERNESS IN MOUTH AND THROAT WITH OR WITHOUT PRESENCE OF ULCERS (sore throat, sores in mouth, or a toothache) UNUSUAL RASH, SWELLING OR PAIN  UNUSUAL VAGINAL DISCHARGE OR ITCHING   Items with * indicate a potential emergency and should be followed up as soon as possible or  go to the Emergency Department if any problems should occur.  Please show the CHEMOTHERAPY ALERT CARD or IMMUNOTHERAPY ALERT CARD at check-in to the Emergency Department and triage nurse.  Should you have questions after your visit or need to cancel or reschedule your appointment, please contact [facility] [place] [phone]  and follow the prompts.  Office hours are 8:00 a.m. to 4:30 p.m. Monday - Friday. Please note that voicemails left after 4:00 p.m. may not be returned until the following business day.  We are closed weekends and major holidays. You have access to a nurse at all times for urgent questions. Please call the main number to the clinic [phone] and follow the prompts.  For any non-urgent questions, you may also contact your provider using MyChart. We now offer e-Visits for anyone 18 and older to request care online for non-urgent symptoms. For details visit [email or web address].   Also download the MyChart app! Go to the app store, search "MyChart", open the app, select [place], and log in with your MyChart username and password.   

## 2022-11-06 NOTE — Progress Notes (Signed)
Patient presents today for Rapid Daratumumab and velcade per providers order.  Vital signs and labs reviewed by MD.  Message received form Chapman Moss RN/Dr. Ellin Saba patient okay for treatment.  Treatment given today per MD orders.  Stable during infusion without adverse affects.  Vital signs stable.  No complaints at this time.  Discharge from clinic ambulatory in stable condition.  Alert and oriented X 3.  Follow up with [facility] as scheduled.

## 2022-11-06 NOTE — Patient Instructions (Signed)
[facility] at [facility] Discharge Instructions   You were seen and examined today by Dr. Katragadda.  He reviewed the results of your lab work which are normal/stable.   We will proceed with your treatment today.  Return as scheduled.    Thank you for choosing [facility] at [facility] to provide your oncology and hematology care.  To afford each patient quality time with our provider, please arrive at least 15 minutes before your scheduled appointment time.   If you have a lab appointment with the Cancer Center please come in thru the Main Entrance and check in at the main information desk.  You need to re-schedule your appointment should you arrive 10 or more minutes late.  We strive to give you quality time with our providers, and arriving late affects you and other patients whose appointments are after yours.  Also, if you no show three or more times for appointments you may be dismissed from the clinic at the providers discretion.     Again, thank you for choosing [facility].  Our hope is that these requests will decrease the amount of time that you wait before being seen by our physicians.       _____________________________________________________________  Should you have questions after your visit to [facility], please contact our office at [phone] and follow the prompts.  Our office hours are 8:00 a.m. and 4:30 p.m. Monday - Friday.  Please note that voicemails left after 4:00 p.m. may not be returned until the following business day.  We are closed weekends and major holidays.  You do have access to a nurse 24-7, just call the main number to the clinic [phone] and do not press any options, hold on the line and a nurse will answer the phone.    For prescription refill requests, have your pharmacy contact our office and allow 72 hours.    Due to Covid, you will need to wear a mask upon entering  the hospital. If you do not have a mask, a mask will be given to you at the Main Entrance upon arrival. For doctor visits, patients may have 1 support person age 18 or older with them. For treatment visits, patients can not have anyone with them due to social distancing guidelines and our immunocompromised population.      

## 2022-11-13 ENCOUNTER — Inpatient Hospital Stay: Payer: Medicare Other

## 2022-11-13 VITALS — BP 130/55 | HR 62 | Temp 98.1°F | Resp 18 | Wt 130.8 lb

## 2022-11-13 VITALS — BP 155/66 | HR 71 | Temp 97.8°F | Resp 16

## 2022-11-13 DIAGNOSIS — C9000 Multiple myeloma not having achieved remission: Secondary | ICD-10-CM

## 2022-11-13 DIAGNOSIS — D6481 Anemia due to antineoplastic chemotherapy: Secondary | ICD-10-CM | POA: Diagnosis not present

## 2022-11-13 DIAGNOSIS — Z5112 Encounter for antineoplastic immunotherapy: Secondary | ICD-10-CM | POA: Diagnosis not present

## 2022-11-13 DIAGNOSIS — Z95828 Presence of other vascular implants and grafts: Secondary | ICD-10-CM

## 2022-11-13 DIAGNOSIS — D630 Anemia in neoplastic disease: Secondary | ICD-10-CM | POA: Diagnosis not present

## 2022-11-13 LAB — COMPREHENSIVE METABOLIC PANEL
ALT: 27 U/L (ref 0–44)
AST: 19 U/L (ref 15–41)
Albumin: 2.8 g/dL — ABNORMAL LOW (ref 3.5–5.0)
Alkaline Phosphatase: 60 U/L (ref 38–126)
Anion gap: 5 (ref 5–15)
BUN: 21 mg/dL (ref 8–23)
CO2: 23 mmol/L (ref 22–32)
Calcium: 8.7 mg/dL — ABNORMAL LOW (ref 8.9–10.3)
Chloride: 103 mmol/L (ref 98–111)
Creatinine, Ser: 0.79 mg/dL (ref 0.61–1.24)
GFR, Estimated: >60 mL/min (ref >60)
Glucose, Bld: 101 mg/dL — ABNORMAL HIGH (ref 70–99)
Potassium: 3.7 mmol/L (ref 3.5–5.1)
Sodium: 131 mmol/L — ABNORMAL LOW (ref 135–145)
Total Bilirubin: 0.5 mg/dL (ref 0.3–1.2)
Total Protein: 7.9 g/dL (ref 6.5–8.1)

## 2022-11-13 LAB — CBC WITH DIFFERENTIAL/PLATELET
Abs Immature Granulocytes: 0.02 10*3/uL (ref 0.00–0.07)
Basophils Absolute: 0 10*3/uL (ref 0.0–0.1)
Basophils Relative: 0 %
Eosinophils Absolute: 0.2 10*3/uL (ref 0.0–0.5)
Eosinophils Relative: 4 %
HCT: 23.9 % — ABNORMAL LOW (ref 39.0–52.0)
Hemoglobin: 8 g/dL — ABNORMAL LOW (ref 13.0–17.0)
Immature Granulocytes: 0 %
Lymphocytes Relative: 23 %
Lymphs Abs: 1 10*3/uL (ref 0.7–4.0)
MCH: 37.9 pg — ABNORMAL HIGH (ref 26.0–34.0)
MCHC: 33.5 g/dL (ref 30.0–36.0)
MCV: 113.3 fL — ABNORMAL HIGH (ref 80.0–100.0)
Monocytes Absolute: 0.8 10*3/uL (ref 0.1–1.0)
Monocytes Relative: 18 %
Neutro Abs: 2.4 10*3/uL (ref 1.7–7.7)
Neutrophils Relative %: 55 %
Platelets: 115 10*3/uL — ABNORMAL LOW (ref 150–400)
RBC: 2.11 MIL/uL — ABNORMAL LOW (ref 4.22–5.81)
WBC: 4.5 10*3/uL (ref 4.0–10.5)
nRBC: 0 % (ref 0.0–0.2)

## 2022-11-13 LAB — PHOSPHORUS: Phosphorus: 3.4 mg/dL (ref 2.5–4.6)

## 2022-11-13 LAB — MAGNESIUM: Magnesium: 2.1 mg/dL (ref 1.7–2.4)

## 2022-11-13 LAB — SAMPLE TO BLOOD BANK

## 2022-11-13 MED ORDER — SODIUM CHLORIDE 0.9% FLUSH
10.0000 mL | INTRAVENOUS | Status: DC | PRN
  Administered 2022-11-13: 10 mL

## 2022-11-13 MED ORDER — ACETAMINOPHEN 325 MG PO TABS
650.0000 mg | ORAL_TABLET | Freq: Once | ORAL | Status: AC
  Administered 2022-11-13: 650 mg via ORAL
  Filled 2022-11-13: qty 2

## 2022-11-13 MED ORDER — CETIRIZINE HCL 10 MG/ML IV SOLN
5.0000 mg | Freq: Once | INTRAVENOUS | Status: AC
  Administered 2022-11-13: 5 mg via INTRAVENOUS
  Filled 2022-11-13: qty 1

## 2022-11-13 MED ORDER — SODIUM CHLORIDE 0.9 % IV SOLN: Freq: Once | INTRAVENOUS | Status: AC

## 2022-11-13 MED ORDER — ONDANSETRON HCL 4 MG/2ML IJ SOLN
8.0000 mg | Freq: Once | INTRAMUSCULAR | Status: AC
  Administered 2022-11-13: 8 mg via INTRAVENOUS
  Filled 2022-11-13: qty 4

## 2022-11-13 MED ORDER — SODIUM CHLORIDE 0.9% FLUSH
10.0000 mL | INTRAVENOUS | Status: DC | PRN
  Administered 2022-11-13: 10 mL via INTRAVENOUS

## 2022-11-13 MED ORDER — METHYLPREDNISOLONE SODIUM SUCC 125 MG IJ SOLR
100.0000 mg | Freq: Once | INTRAMUSCULAR | Status: AC
  Administered 2022-11-13: 100 mg via INTRAVENOUS
  Filled 2022-11-13: qty 2

## 2022-11-13 MED ORDER — BORTEZOMIB CHEMO SQ INJECTION 3.5 MG (2.5MG/ML)
1.0000 mg/m2 | Freq: Once | INTRAMUSCULAR | Status: AC
  Administered 2022-11-13: 1.75 mg via SUBCUTANEOUS
  Filled 2022-11-13: qty 0.7

## 2022-11-13 MED ORDER — HEPARIN SOD (PORK) LOCK FLUSH 100 UNIT/ML IV SOLN
500.0000 [IU] | Freq: Once | INTRAVENOUS | Status: AC | PRN
  Administered 2022-11-13: 500 [IU]

## 2022-11-13 MED ORDER — SODIUM CHLORIDE 0.9 % IV SOLN
16.0000 mg/kg | Freq: Once | INTRAVENOUS | Status: AC
  Administered 2022-11-13: 900 mg via INTRAVENOUS
  Filled 2022-11-13: qty 40

## 2022-11-13 NOTE — Progress Notes (Signed)
Patients port flushed without difficulty.  Good blood return noted with no bruising or swelling noted at site.  Patient remains accessed for chemotherapy treatment.  

## 2022-11-13 NOTE — Patient Instructions (Signed)
[facility]  Discharge Instructions: Thank you for choosing [facility] to provide your oncology and hematology care.  If you have a lab appointment with the Cancer Center - please note that after April 8th, 2024, all labs will be drawn in the cancer center.  You do not have to check in or register with the main entrance as you have in the past but will complete your check-in in the cancer center.  Wear comfortable clothing and clothing appropriate for easy access to any Portacath or PICC line.   We strive to give you quality time with your provider. You may need to reschedule your appointment if you arrive late (15 or more minutes).  Arriving late affects you and other patients whose appointments are after yours.  Also, if you miss three or more appointments without notifying the office, you may be dismissed from the clinic at the provider's discretion.      For prescription refill requests, have your pharmacy contact our office and allow 72 hours for refills to be completed.    Today you received the following chemotherapy and/or immunotherapy agents: daratumumab and bortezomib      To help prevent nausea and vomiting after your treatment, we encourage you to take your nausea medication as directed.  BELOW ARE SYMPTOMS THAT SHOULD BE REPORTED IMMEDIATELY: *FEVER GREATER THAN 100.4 F (38 C) OR HIGHER *CHILLS OR SWEATING *NAUSEA AND VOMITING THAT IS NOT CONTROLLED WITH YOUR NAUSEA MEDICATION *UNUSUAL SHORTNESS OF BREATH *UNUSUAL BRUISING OR BLEEDING *URINARY PROBLEMS (pain or burning when urinating, or frequent urination) *BOWEL PROBLEMS (unusual diarrhea, constipation, pain near the anus) TENDERNESS IN MOUTH AND THROAT WITH OR WITHOUT PRESENCE OF ULCERS (sore throat, sores in mouth, or a toothache) UNUSUAL RASH, SWELLING OR PAIN  UNUSUAL VAGINAL DISCHARGE OR ITCHING   Items with * indicate a potential emergency and should be followed up as soon as  possible or go to the Emergency Department if any problems should occur.  Please show the CHEMOTHERAPY ALERT CARD or IMMUNOTHERAPY ALERT CARD at check-in to the Emergency Department and triage nurse.  Should you have questions after your visit or need to cancel or reschedule your appointment, please contact [facility] [phone]  and follow the prompts.  Office hours are 8:00 a.m. to 4:30 p.m. Monday - Friday. Please note that voicemails left after 4:00 p.m. may not be returned until the following business day.  We are closed weekends and major holidays. You have access to a nurse at all times for urgent questions. Please call the main number to the clinic [phone] and follow the prompts.  For any non-urgent questions, you may also contact your provider using MyChart. We now offer e-Visits for anyone 43 and older to request care online for non-urgent symptoms. For details visit [email or web address].   Also download the MyChart app! Go to the app store, search "MyChart", open the app, select , and log in with your MyChart username and password.

## 2022-11-15 ENCOUNTER — Other Ambulatory Visit: Payer: Self-pay

## 2022-11-15 DIAGNOSIS — C9000 Multiple myeloma not having achieved remission: Secondary | ICD-10-CM

## 2022-11-17 ENCOUNTER — Other Ambulatory Visit: Payer: Self-pay

## 2022-11-17 ENCOUNTER — Inpatient Hospital Stay (HOSPITAL_COMMUNITY): Payer: Medicare Other

## 2022-11-17 ENCOUNTER — Encounter (HOSPITAL_COMMUNITY): Payer: Self-pay | Admitting: Emergency Medicine

## 2022-11-17 ENCOUNTER — Inpatient Hospital Stay (HOSPITAL_COMMUNITY)
Admission: EM | Admit: 2022-11-17 | Discharge: 2022-11-20 | DRG: 871 | Disposition: A | Discharge status: Home Health | Payer: Medicare Other | Attending: Family Medicine | Admitting: Family Medicine

## 2022-11-17 ENCOUNTER — Emergency Department (HOSPITAL_COMMUNITY): Payer: Medicare Other

## 2022-11-17 DIAGNOSIS — R6520 Severe sepsis without septic shock: Secondary | ICD-10-CM | POA: Diagnosis present

## 2022-11-17 DIAGNOSIS — Z79899 Other long term (current) drug therapy: Secondary | ICD-10-CM | POA: Diagnosis not present

## 2022-11-17 DIAGNOSIS — Z955 Presence of coronary angioplasty implant and graft: Secondary | ICD-10-CM | POA: Diagnosis not present

## 2022-11-17 DIAGNOSIS — Z8249 Family history of ischemic heart disease and other diseases of the circulatory system: Secondary | ICD-10-CM

## 2022-11-17 DIAGNOSIS — Z66 Do not resuscitate: Secondary | ICD-10-CM | POA: Diagnosis not present

## 2022-11-17 DIAGNOSIS — D539 Nutritional anemia, unspecified: Secondary | ICD-10-CM | POA: Diagnosis not present

## 2022-11-17 DIAGNOSIS — T451X5A Adverse effect of antineoplastic and immunosuppressive drugs, initial encounter: Secondary | ICD-10-CM | POA: Diagnosis present

## 2022-11-17 DIAGNOSIS — Z888 Allergy status to other drugs, medicaments and biological substances status: Secondary | ICD-10-CM

## 2022-11-17 DIAGNOSIS — Z823 Family history of stroke: Secondary | ICD-10-CM

## 2022-11-17 DIAGNOSIS — E876 Hypokalemia: Secondary | ICD-10-CM | POA: Diagnosis present

## 2022-11-17 DIAGNOSIS — I5032 Chronic diastolic (congestive) heart failure: Secondary | ICD-10-CM | POA: Diagnosis not present

## 2022-11-17 DIAGNOSIS — A419 Sepsis, unspecified organism: Principal | ICD-10-CM | POA: Diagnosis present

## 2022-11-17 DIAGNOSIS — Z23 Encounter for immunization: Secondary | ICD-10-CM | POA: Diagnosis not present

## 2022-11-17 DIAGNOSIS — C9000 Multiple myeloma not having achieved remission: Secondary | ICD-10-CM | POA: Diagnosis present

## 2022-11-17 DIAGNOSIS — Z881 Allergy status to other antibiotic agents status: Secondary | ICD-10-CM

## 2022-11-17 DIAGNOSIS — R531 Weakness: Secondary | ICD-10-CM | POA: Diagnosis not present

## 2022-11-17 DIAGNOSIS — E871 Hypo-osmolality and hyponatremia: Secondary | ICD-10-CM | POA: Diagnosis present

## 2022-11-17 DIAGNOSIS — I700 Atherosclerosis of aorta: Secondary | ICD-10-CM | POA: Diagnosis not present

## 2022-11-17 DIAGNOSIS — Z87891 Personal history of nicotine dependence: Secondary | ICD-10-CM | POA: Diagnosis not present

## 2022-11-17 DIAGNOSIS — Z7401 Bed confinement status: Secondary | ICD-10-CM

## 2022-11-17 DIAGNOSIS — E785 Hyperlipidemia, unspecified: Secondary | ICD-10-CM | POA: Diagnosis not present

## 2022-11-17 DIAGNOSIS — R918 Other nonspecific abnormal finding of lung field: Secondary | ICD-10-CM | POA: Diagnosis not present

## 2022-11-17 DIAGNOSIS — E861 Hypovolemia: Secondary | ICD-10-CM | POA: Diagnosis not present

## 2022-11-17 DIAGNOSIS — D630 Anemia in neoplastic disease: Secondary | ICD-10-CM | POA: Diagnosis present

## 2022-11-17 DIAGNOSIS — Z87442 Personal history of urinary calculi: Secondary | ICD-10-CM

## 2022-11-17 DIAGNOSIS — R6510 Systemic inflammatory response syndrome (SIRS) of non-infectious origin without acute organ dysfunction: Secondary | ICD-10-CM | POA: Diagnosis not present

## 2022-11-17 DIAGNOSIS — N200 Calculus of kidney: Secondary | ICD-10-CM | POA: Diagnosis not present

## 2022-11-17 DIAGNOSIS — Z7982 Long term (current) use of aspirin: Secondary | ICD-10-CM

## 2022-11-17 DIAGNOSIS — W19XXXA Unspecified fall, initial encounter: Secondary | ICD-10-CM | POA: Diagnosis not present

## 2022-11-17 DIAGNOSIS — K4090 Unilateral inguinal hernia, without obstruction or gangrene, not specified as recurrent: Secondary | ICD-10-CM | POA: Diagnosis not present

## 2022-11-17 DIAGNOSIS — Z880 Allergy status to penicillin: Secondary | ICD-10-CM

## 2022-11-17 DIAGNOSIS — Z820 Family history of epilepsy and other diseases of the nervous system: Secondary | ICD-10-CM

## 2022-11-17 DIAGNOSIS — R509 Fever, unspecified: Secondary | ICD-10-CM | POA: Diagnosis not present

## 2022-11-17 DIAGNOSIS — J45909 Unspecified asthma, uncomplicated: Secondary | ICD-10-CM | POA: Diagnosis present

## 2022-11-17 DIAGNOSIS — Z1152 Encounter for screening for COVID-19: Secondary | ICD-10-CM | POA: Diagnosis not present

## 2022-11-17 DIAGNOSIS — I509 Heart failure, unspecified: Secondary | ICD-10-CM | POA: Diagnosis not present

## 2022-11-17 DIAGNOSIS — D638 Anemia in other chronic diseases classified elsewhere: Secondary | ICD-10-CM | POA: Diagnosis present

## 2022-11-17 DIAGNOSIS — Z882 Allergy status to sulfonamides status: Secondary | ICD-10-CM

## 2022-11-17 DIAGNOSIS — D696 Thrombocytopenia, unspecified: Secondary | ICD-10-CM | POA: Diagnosis present

## 2022-11-17 DIAGNOSIS — D869 Sarcoidosis, unspecified: Secondary | ICD-10-CM | POA: Diagnosis present

## 2022-11-17 DIAGNOSIS — I2510 Atherosclerotic heart disease of native coronary artery without angina pectoris: Secondary | ICD-10-CM | POA: Diagnosis not present

## 2022-11-17 DIAGNOSIS — R7989 Other specified abnormal findings of blood chemistry: Secondary | ICD-10-CM | POA: Diagnosis not present

## 2022-11-17 DIAGNOSIS — J189 Pneumonia, unspecified organism: Secondary | ICD-10-CM | POA: Diagnosis not present

## 2022-11-17 DIAGNOSIS — I110 Hypertensive heart disease with heart failure: Secondary | ICD-10-CM | POA: Diagnosis not present

## 2022-11-17 DIAGNOSIS — Z8711 Personal history of peptic ulcer disease: Secondary | ICD-10-CM

## 2022-11-17 DIAGNOSIS — D849 Immunodeficiency, unspecified: Secondary | ICD-10-CM | POA: Diagnosis present

## 2022-11-17 DIAGNOSIS — D6481 Anemia due to antineoplastic chemotherapy: Secondary | ICD-10-CM | POA: Diagnosis present

## 2022-11-17 DIAGNOSIS — E039 Hypothyroidism, unspecified: Secondary | ICD-10-CM | POA: Diagnosis present

## 2022-11-17 DIAGNOSIS — I10 Essential (primary) hypertension: Secondary | ICD-10-CM | POA: Diagnosis not present

## 2022-11-17 DIAGNOSIS — K219 Gastro-esophageal reflux disease without esophagitis: Secondary | ICD-10-CM | POA: Diagnosis present

## 2022-11-17 DIAGNOSIS — K5730 Diverticulosis of large intestine without perforation or abscess without bleeding: Secondary | ICD-10-CM | POA: Diagnosis not present

## 2022-11-17 DIAGNOSIS — Z79890 Hormone replacement therapy (postmenopausal): Secondary | ICD-10-CM

## 2022-11-17 LAB — URINALYSIS, W/ REFLEX TO CULTURE (INFECTION SUSPECTED)
Bacteria, UA: NONE SEEN
Bilirubin Urine: NEGATIVE
Glucose, UA: NEGATIVE mg/dL
Ketones, ur: NEGATIVE mg/dL
Leukocytes,Ua: NEGATIVE
Nitrite: NEGATIVE
Protein, ur: 30 mg/dL — AB
Specific Gravity, Urine: 1.014 (ref 1.005–1.030)
pH: 6 (ref 5.0–8.0)

## 2022-11-17 LAB — COMPREHENSIVE METABOLIC PANEL
ALT: 24 U/L (ref 0–44)
AST: 15 U/L (ref 15–41)
Albumin: 3.3 g/dL — ABNORMAL LOW (ref 3.5–5.0)
Alkaline Phosphatase: 67 U/L (ref 38–126)
Anion gap: 11 (ref 5–15)
BUN: 28 mg/dL — ABNORMAL HIGH (ref 8–23)
CO2: 22 mmol/L (ref 22–32)
Calcium: 8.9 mg/dL (ref 8.9–10.3)
Chloride: 99 mmol/L (ref 98–111)
Creatinine, Ser: 1.09 mg/dL (ref 0.61–1.24)
GFR, Estimated: >60 mL/min (ref >60)
Glucose, Bld: 126 mg/dL — ABNORMAL HIGH (ref 70–99)
Potassium: 3.6 mmol/L (ref 3.5–5.1)
Sodium: 132 mmol/L — ABNORMAL LOW (ref 135–145)
Total Bilirubin: 0.8 mg/dL (ref 0.3–1.2)
Total Protein: 8.5 g/dL — ABNORMAL HIGH (ref 6.5–8.1)

## 2022-11-17 LAB — CBC WITH DIFFERENTIAL/PLATELET
Abs Immature Granulocytes: 0.05 10*3/uL (ref 0.00–0.07)
Basophils Absolute: 0 10*3/uL (ref 0.0–0.1)
Basophils Relative: 0 %
Eosinophils Absolute: 0 10*3/uL (ref 0.0–0.5)
Eosinophils Relative: 0 %
HCT: 28.1 % — ABNORMAL LOW (ref 39.0–52.0)
Hemoglobin: 9.3 g/dL — ABNORMAL LOW (ref 13.0–17.0)
Immature Granulocytes: 1 %
Lymphocytes Relative: 8 %
Lymphs Abs: 0.6 10*3/uL — ABNORMAL LOW (ref 0.7–4.0)
MCH: 37.3 pg — ABNORMAL HIGH (ref 26.0–34.0)
MCHC: 33.1 g/dL (ref 30.0–36.0)
MCV: 112.9 fL — ABNORMAL HIGH (ref 80.0–100.0)
Monocytes Absolute: 0.6 10*3/uL (ref 0.1–1.0)
Monocytes Relative: 7 %
Neutro Abs: 7 10*3/uL (ref 1.7–7.7)
Neutrophils Relative %: 84 %
Platelets: 44 10*3/uL — ABNORMAL LOW (ref 150–400)
RBC: 2.49 MIL/uL — ABNORMAL LOW (ref 4.22–5.81)
WBC: 8.4 10*3/uL (ref 4.0–10.5)
nRBC: 0 % (ref 0.0–0.2)

## 2022-11-17 LAB — SARS CORONAVIRUS 2 BY RT PCR: SARS Coronavirus 2 by RT PCR: NEGATIVE

## 2022-11-17 LAB — RESP PANEL BY RT-PCR (RSV, FLU A&B, COVID)  RVPGX2
Influenza A by PCR: NEGATIVE
Influenza B by PCR: NEGATIVE
Resp Syncytial Virus by PCR: NEGATIVE
SARS Coronavirus 2 by RT PCR: NEGATIVE

## 2022-11-17 LAB — STREP PNEUMONIAE URINARY ANTIGEN: Strep Pneumo Urinary Antigen: NEGATIVE

## 2022-11-17 LAB — PROCALCITONIN: Procalcitonin: 0.16 ng/mL

## 2022-11-17 LAB — APTT: aPTT: 27 seconds (ref 24–36)

## 2022-11-17 LAB — CK: Total CK: 266 U/L (ref 49–397)

## 2022-11-17 LAB — PROTIME-INR
INR: 1.2 (ref 0.8–1.2)
Prothrombin Time: 15.3 seconds — ABNORMAL HIGH (ref 11.4–15.2)

## 2022-11-17 LAB — C-REACTIVE PROTEIN: CRP: 3.5 mg/dL — ABNORMAL HIGH (ref <1.0)

## 2022-11-17 LAB — D-DIMER, QUANTITATIVE: D-Dimer, Quant: 1.15 ug/mL-FEU — ABNORMAL HIGH (ref 0.00–0.50)

## 2022-11-17 LAB — LACTIC ACID, PLASMA
Lactic Acid, Venous: 3.1 mmol/L (ref 0.5–1.9)
Lactic Acid, Venous: 1.3 mmol/L (ref 0.5–1.9)

## 2022-11-17 MED ORDER — SODIUM CHLORIDE 0.9 % IV BOLUS
1000.0000 mL | Freq: Once | INTRAVENOUS | Status: AC
  Administered 2022-11-17: 1000 mL via INTRAVENOUS

## 2022-11-17 MED ORDER — LEVOTHYROXINE SODIUM 75 MCG PO TABS
75.0000 ug | ORAL_TABLET | Freq: Every day | ORAL | Status: DC
  Administered 2022-11-18 – 2022-11-20 (×3): 75 ug via ORAL
  Filled 2022-11-17 (×3): qty 1

## 2022-11-17 MED ORDER — SODIUM CHLORIDE 0.9 % IV SOLN: INTRAVENOUS | Status: DC

## 2022-11-17 MED ORDER — VITAMIN E 45 MG (100 UNIT) PO CAPS
200.0000 [IU] | ORAL_CAPSULE | Freq: Every evening | ORAL | Status: DC
  Administered 2022-11-17 – 2022-11-19 (×2): 200 [IU] via ORAL
  Filled 2022-11-17 (×5): qty 2

## 2022-11-17 MED ORDER — VITAMIN C 500 MG PO TABS
500.0000 mg | ORAL_TABLET | Freq: Every evening | ORAL | Status: DC
  Administered 2022-11-17 – 2022-11-19 (×3): 500 mg via ORAL
  Filled 2022-11-17 (×3): qty 1

## 2022-11-17 MED ORDER — VANCOMYCIN HCL IN DEXTROSE 1-5 GM/200ML-% IV SOLN
1000.0000 mg | Freq: Once | INTRAVENOUS | Status: AC
  Administered 2022-11-17: 1000 mg via INTRAVENOUS
  Filled 2022-11-17: qty 200

## 2022-11-17 MED ORDER — IOHEXOL 300 MG/ML  SOLN
100.0000 mL | Freq: Once | INTRAMUSCULAR | Status: AC | PRN
  Administered 2022-11-17: 80 mL via INTRAVENOUS

## 2022-11-17 MED ORDER — ACETAMINOPHEN 650 MG RE SUPP: 650.0000 mg | Freq: Four times a day (QID) | RECTAL | Status: DC | PRN

## 2022-11-17 MED ORDER — TRAZODONE HCL 50 MG PO TABS: 50.0000 mg | ORAL_TABLET | Freq: Every evening | ORAL | Status: DC | PRN

## 2022-11-17 MED ORDER — PROCHLORPERAZINE MALEATE 5 MG PO TABS: 10.0000 mg | ORAL_TABLET | Freq: Four times a day (QID) | ORAL | Status: DC | PRN

## 2022-11-17 MED ORDER — FLUCONAZOLE IN SODIUM CHLORIDE 200-0.9 MG/100ML-% IV SOLN
200.0000 mg | INTRAVENOUS | Status: DC
  Administered 2022-11-17 – 2022-11-19 (×3): 200 mg via INTRAVENOUS
  Filled 2022-11-17 (×3): qty 100

## 2022-11-17 MED ORDER — SODIUM CHLORIDE 0.9% FLUSH
3.0000 mL | Freq: Two times a day (BID) | INTRAVENOUS | Status: DC
  Administered 2022-11-17 – 2022-11-19 (×5): 3 mL via INTRAVENOUS

## 2022-11-17 MED ORDER — ALBUTEROL SULFATE (2.5 MG/3ML) 0.083% IN NEBU: 2.5000 mg | INHALATION_SOLUTION | RESPIRATORY_TRACT | Status: DC | PRN

## 2022-11-17 MED ORDER — ISOSORBIDE MONONITRATE ER 60 MG PO TB24
30.0000 mg | ORAL_TABLET | Freq: Every day | ORAL | Status: DC
  Administered 2022-11-17 – 2022-11-19 (×3): 30 mg via ORAL
  Filled 2022-11-17 (×3): qty 1

## 2022-11-17 MED ORDER — POLYETHYLENE GLYCOL 3350 17 G PO PACK: 17.0000 g | PACK | Freq: Every day | ORAL | Status: DC | PRN

## 2022-11-17 MED ORDER — SODIUM CHLORIDE 0.9% FLUSH: 3.0000 mL | INTRAVENOUS | Status: DC | PRN

## 2022-11-17 MED ORDER — ATORVASTATIN CALCIUM 40 MG PO TABS
40.0000 mg | ORAL_TABLET | Freq: Every day | ORAL | Status: DC
  Administered 2022-11-18 – 2022-11-20 (×3): 40 mg via ORAL
  Filled 2022-11-17 (×4): qty 1

## 2022-11-17 MED ORDER — SODIUM CHLORIDE 0.9 % IV SOLN: INTRAVENOUS | Status: DC | PRN

## 2022-11-17 MED ORDER — ENSURE ENLIVE PO LIQD
237.0000 mL | Freq: Three times a day (TID) | ORAL | Status: DC
  Administered 2022-11-17 – 2022-11-19 (×3): 237 mL via ORAL
  Filled 2022-11-17 (×5): qty 237

## 2022-11-17 MED ORDER — PANTOPRAZOLE SODIUM 40 MG PO TBEC
40.0000 mg | DELAYED_RELEASE_TABLET | Freq: Every day | ORAL | Status: DC
  Administered 2022-11-17 – 2022-11-20 (×4): 40 mg via ORAL
  Filled 2022-11-17 (×4): qty 1

## 2022-11-17 MED ORDER — ACYCLOVIR 800 MG PO TABS
400.0000 mg | ORAL_TABLET | Freq: Two times a day (BID) | ORAL | Status: DC
  Administered 2022-11-17 – 2022-11-20 (×7): 400 mg via ORAL
  Filled 2022-11-17 (×7): qty 1

## 2022-11-17 MED ORDER — OCUVITE-LUTEIN PO CAPS
1.0000 | ORAL_CAPSULE | Freq: Two times a day (BID) | ORAL | Status: DC
  Administered 2022-11-17 – 2022-11-20 (×7): 1 via ORAL
  Filled 2022-11-17 (×10): qty 1

## 2022-11-17 MED ORDER — VANCOMYCIN HCL 750 MG/150ML IV SOLN
750.0000 mg | INTRAVENOUS | Status: DC
  Administered 2022-11-18: 750 mg via INTRAVENOUS
  Filled 2022-11-17 (×2): qty 150

## 2022-11-17 MED ORDER — VITAMIN B-12 1000 MCG PO TABS
1000.0000 ug | ORAL_TABLET | Freq: Every day | ORAL | Status: DC
  Administered 2022-11-17 – 2022-11-20 (×4): 1000 ug via ORAL
  Filled 2022-11-17 (×4): qty 1

## 2022-11-17 MED ORDER — NETARSUDIL-LATANOPROST 0.02-0.005 % OP SOLN
1.0000 [drp] | Freq: Every day | OPHTHALMIC | Status: DC
  Administered 2022-11-17: 1 [drp] via OPHTHALMIC

## 2022-11-17 MED ORDER — ONDANSETRON HCL 4 MG PO TABS: 4.0000 mg | ORAL_TABLET | Freq: Four times a day (QID) | ORAL | Status: DC | PRN

## 2022-11-17 MED ORDER — CHLORHEXIDINE GLUCONATE CLOTH 2 % EX PADS
6.0000 | MEDICATED_PAD | Freq: Every day | CUTANEOUS | Status: DC
  Administered 2022-11-18 – 2022-11-19 (×2): 6 via TOPICAL

## 2022-11-17 MED ORDER — PNEUMOCOCCAL 20-VAL CONJ VACC 0.5 ML IM SUSY
0.5000 mL | PREFILLED_SYRINGE | INTRAMUSCULAR | Status: AC
  Administered 2022-11-18: 0.5 mL via INTRAMUSCULAR
  Filled 2022-11-17: qty 0.5

## 2022-11-17 MED ORDER — ONDANSETRON HCL 4 MG/2ML IJ SOLN: 4.0000 mg | Freq: Four times a day (QID) | INTRAMUSCULAR | Status: DC | PRN

## 2022-11-17 MED ORDER — SODIUM CHLORIDE 0.9 % IV SOLN
2.0000 g | Freq: Once | INTRAVENOUS | Status: AC
  Administered 2022-11-17: 2 g via INTRAVENOUS
  Filled 2022-11-17: qty 20

## 2022-11-17 MED ORDER — ACETAMINOPHEN 325 MG PO TABS
650.0000 mg | ORAL_TABLET | Freq: Four times a day (QID) | ORAL | Status: DC | PRN
  Administered 2022-11-18: 650 mg via ORAL
  Filled 2022-11-17: qty 2

## 2022-11-17 MED ORDER — FUROSEMIDE 40 MG PO TABS: 20.0000 mg | ORAL_TABLET | Freq: Two times a day (BID) | ORAL | Status: DC

## 2022-11-17 MED ORDER — BRIMONIDINE TARTRATE 0.2 % OP SOLN
1.0000 [drp] | Freq: Two times a day (BID) | OPHTHALMIC | Status: DC
  Administered 2022-11-17 – 2022-11-20 (×7): 1 [drp] via OPHTHALMIC
  Filled 2022-11-17 (×3): qty 5

## 2022-11-17 MED ORDER — SODIUM CHLORIDE 0.9 % IV SOLN
2.0000 g | Freq: Two times a day (BID) | INTRAVENOUS | Status: DC
  Administered 2022-11-17 – 2022-11-20 (×7): 2 g via INTRAVENOUS
  Filled 2022-11-17 (×7): qty 12.5

## 2022-11-17 MED ORDER — SODIUM CHLORIDE 0.9% FLUSH
3.0000 mL | Freq: Two times a day (BID) | INTRAVENOUS | Status: DC
  Administered 2022-11-17 – 2022-11-19 (×4): 3 mL via INTRAVENOUS

## 2022-11-17 MED ORDER — METOPROLOL TARTRATE 25 MG PO TABS
25.0000 mg | ORAL_TABLET | Freq: Two times a day (BID) | ORAL | Status: DC
  Administered 2022-11-17 – 2022-11-20 (×7): 25 mg via ORAL
  Filled 2022-11-17 (×8): qty 1

## 2022-11-17 MED ORDER — ASPIRIN 81 MG PO TBEC
81.0000 mg | DELAYED_RELEASE_TABLET | Freq: Every day | ORAL | Status: DC
  Administered 2022-11-17 – 2022-11-20 (×4): 81 mg via ORAL
  Filled 2022-11-17 (×4): qty 1

## 2022-11-17 MED ORDER — DORZOLAMIDE HCL-TIMOLOL MAL 2-0.5 % OP SOLN
1.0000 [drp] | Freq: Two times a day (BID) | OPHTHALMIC | Status: DC
  Administered 2022-11-17 – 2022-11-20 (×7): 1 [drp] via OPHTHALMIC
  Filled 2022-11-17 (×3): qty 10

## 2022-11-17 MED ORDER — HEPARIN SODIUM (PORCINE) 5000 UNIT/ML IJ SOLN: 5000.0000 [IU] | Freq: Three times a day (TID) | INTRAMUSCULAR | Status: DC

## 2022-11-17 MED ORDER — ACETAMINOPHEN 500 MG PO TABS
1000.0000 mg | ORAL_TABLET | Freq: Once | ORAL | Status: AC
  Administered 2022-11-17: 1000 mg via ORAL
  Filled 2022-11-17: qty 2

## 2022-11-17 MED ORDER — BISACODYL 10 MG RE SUPP: 10.0000 mg | Freq: Every day | RECTAL | Status: DC | PRN

## 2022-11-17 NOTE — H&P (Addendum)
History and Physical    Patient: Dale [name]:[id number] DOB: 1928-11-07 DOA: 11/17/2022 DOS: the patient was seen and examined on 11/17/2022 PCP: Pcp, No  Patient coming from: Home-lives with wife  Chief Complaint:  Chief Complaint  Patient presents with   Weakness   HPI: Dale [name] is a 89 y.o. 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 Ellin Saba.  Patient was brought in by EMS due to reports of significant weakness.  His wife found him kneeling on the floor by the bed.  Patient states he has been trying to get into the bed but the bed was too tall.  In discussion with the patient he developed abrupt onset of significant weakness wet cough with white phlegm generalized weakness.  Denies shortness of breath.  He was also having some soreness in the left lateral rib cage that is not reproducible with palpation.  Upon presentation to the ED he was afebrile with a rectal temperature of 103.2, white count was elevated at 13,000, room air sats have been between 93 and 97%, lactic acid was elevated at 3.1.  Plain chest film showed no acute abnormality.  CT of the abdomen and pelvis revealed a new masslike opacity in the base of the left lower lobe with surrounding groundglass attenuation in the adjacent left lower lobe likely infectious or inflammatory in etiology but neoplasm could not be entirely excluded.  A follow-up noncontrasted CT of the chest was recommended.  In the ED he was given a liter of IV fluids as well as a dose of IV Rocephin.  Hospitalist service was consulted to evaluate the patient for admission.  Upon my evaluation the patient he gave me the same history regarding onset of symptoms.  He has not had any falls, he has not had any dyspnea or orthopnea.  No chest pain other than the left lateral rib cage pain, no nausea  and no vomiting.  He states that he is eating well.  Review of Systems: As mentioned in the history of present illness. All other systems reviewed and are negative. Past Medical History:  Diagnosis Date   Allergic rhinitis    Anal fissure    Anginal pain (HCC)    Asthma    Cancer (HCC)    skin   Chest pain    CHF (congestive heart failure) (HCC)    Coronary heart disease    s/p stenting. cath in 01/2012 noncritical occlusion   Dyspnea    Dysrhythmia    1st degree heart block   GERD (gastroesophageal reflux disease)    Glaucoma    Hiatal hernia    Hyperlipidemia    Hypertension    Hypothyroidism    Idiopathic thrombocytopenic purpura (HCC) 2002   Macular degeneration    Multiple myeloma (HCC) 01/15/2021   Nephrolithiasis    PUD (peptic ulcer disease)    remote   Sarcoidosis    pulmonary   Schatzki's ring    Past Surgical History:  Procedure Laterality Date   cardiac stents     COLONOSCOPY  10/30/2006   Normal rectum, sigmoid diverticula.Remainder of colonic mucosa appeared normal.   CORONARY ANGIOPLASTY WITH STENT PLACEMENT     about 10 years ago per pt (around 2007)   CYSTOSCOPY WITH RETROGRADE PYELOGRAM, URETEROSCOPY AND STENT PLACEMENT Left 06/16/2017   Procedure: CYSTOSCOPY WITH RETROGRADE PYELOGRAM, URETEROSCOPY,STONE EXTRACTION  AND STENT PLACEMENT;  Surgeon: Marcine Matar, MD;  Location: [facility];  Service: Urology;  Laterality: Left;   ESOPHAGOGASTRODUODENOSCOPY  06/19/2004   Two esophageal rings and esophageal web as described above.  All of these were disrupted by passing 56-French Elease Hashimoto dilator/ Candida esophagitis,which appears to be incidental given history of   antibiotic use, but nevertheless will be treated.   ESOPHAGOGASTRODUODENOSCOPY  10/30/2006   Distal tandem esophageal ring status post dilation disruption as  described above.  Otherwise normal esophagus/  Small hiatal hernia otherwise normal stomach, D1 and D2   ESOPHAGOGASTRODUODENOSCOPY N/A  03/22/2015   Dr.Rourk- noncritical schatzki's ring and hiatal hernia-o/w normal EGD.    ESOPHAGOGASTRODUODENOSCOPY (EGD) WITH ESOPHAGEAL DILATION  03/04/2012   RMR- schatzki's ring, hiatal hernia, polypoid gastric mucosa, bx= minimally active gastritis.   HOLMIUM LASER APPLICATION Left 06/16/2017   Procedure: HOLMIUM LASER APPLICATION;  Surgeon: Marcine Matar, MD;  Location: [facility];  Service: Urology;  Laterality: Left;   IR CV LINE INJECTION  12/27/2021   IR GENERIC HISTORICAL  03/06/2016   IR RADIOLOGIST EVAL & MGMT 03/06/2016 Irish Lack, MD [facility]   IR GENERIC HISTORICAL  06/18/2016   IR RADIOLOGIST EVAL & MGMT 06/18/2016 Irish Lack, MD [facility]   IR IMAGING GUIDED PORT INSERTION  12/18/2021   IR IMAGING GUIDED PORT INSERTION  01/15/2022   IR RADIOLOGIST EVAL & MGMT  10/01/2016   IR RADIOLOGIST EVAL & MGMT  10/15/2017   IR RADIOLOGIST EVAL & MGMT  12/24/2018   IR RADIOLOGIST EVAL & MGMT  01/04/2020   IR RADIOLOGIST EVAL & MGMT  06/20/2021   IR REMOVAL TUN ACCESS W/ PORT W/O FL MOD SED  01/15/2022   LEFT HEART CATH N/A 02/02/2012   Procedure: LEFT HEART CATH;  Surgeon: Runell Gess, MD;  Location: [facility];  Service: Cardiovascular;  Laterality: N/A;   MEDIASTINOSCOPY     for dx sarcoid   RADIOLOGY WITH ANESTHESIA Left 05/17/2016   Procedure: left renal ablation;  Surgeon: Irish Lack, MD;  Location: [facility];  Service: Radiology;  Laterality: Left;   Social History:  reports that he quit smoking about 52 years ago. His smoking use included cigarettes and cigars. He started smoking about 54 years ago. He has a 0.2 pack-year smoking history. He has never used smokeless tobacco. He reports that he does not drink alcohol and does not use drugs.  Allergies  Allergen Reactions   Azithromycin Other (See Comments)    Sore mouth and fever blisters around mouth, sores in nose area as well   Doxazosin Shortness Of Breath   Atenolol Other (See Comments)    UNKNOWN  REACTION   Hydrocodone Nausea And Vomiting   Levofloxacin Other (See Comments)    Caused stomach problems.   Morphine Other (See Comments)    "made me crazy"   Penicillins Nausea And Vomiting and Other (See Comments)    Has patient had a PCN reaction causing immediate rash, facial/tongue/throat swelling, SOB or lightheadedness with hypotension: No Has patient had a PCN reaction causing severe rash involving mucus membranes or skin necrosis: No Has patient had a PCN reaction that required hospitalization No Has patient had a PCN reaction occurring within the last 10 years: No If all of the above answers are "NO", then may proceed with Cephalosporin use.    Sulfonamide Derivatives Nausea And Vomiting    Family History  Problem Relation Age of Onset   Heart disease Father        deceased  age 30   Stroke Mother    Alzheimer's disease Mother    Heart attack Brother        deceased age 27   Cancer Other        niece   Colon cancer Neg Hx     Prior to Admission medications   Medication Sig Start Date End Date Taking? Authorizing Provider  acetaminophen (TYLENOL) 325 MG tablet Take 2 tablets (650 mg total) by mouth every 6 (six) hours as needed for mild pain (or Fever >/= 101). 01/17/22   Vassie Loll, MD  acyclovir (ZOVIRAX) 400 MG tablet TAKE 1 TABLET BY MOUTH TWICE  DAILY 08/19/22   Doreatha Massed, MD  Ascorbic Acid (VITAMIN C PO) Take 500 mg by mouth every evening.    [provider]  aspirin EC 81 MG tablet Take 1 tablet (81 mg total) by mouth daily with breakfast. For 30 days Only Patient taking differently: Take 81 mg by mouth daily with breakfast. Every Monday, Wednesday, and Friday 06/04/22 06/04/23  Shon Hale, MD  atorvastatin (LIPITOR) 40 MG tablet TAKE 1 TABLET BY MOUTH IN THE  EVENING 09/06/22   Lennette Bihari, MD  Bortezomib (VELCADE IJ) Inject as directed once a week.    [provider]  brimonidine (ALPHAGAN) 0.2 % ophthalmic solution Place 1  drop into the left eye 2 (two) times daily.    [provider]  cyanocobalamin 1000 MCG tablet Take 1 tablet (1,000 mcg total) by mouth daily. 01/18/22   Vassie Loll, MD  dexamethasone (DECADRON) 2 MG tablet TAKE 5 TABLETS BY MOUTH ONCE  WEEKLY 08/26/22   Doreatha Massed, MD  dorzolamide-timolol (COSOPT) 22.3-6.8 MG/ML ophthalmic solution Place 1 drop into the left eye 2 (two) times daily.    [provider]  feeding supplement (ENSURE ENLIVE / ENSURE PLUS) LIQD Take 237 mLs by mouth 3 (three) times daily between meals. 01/17/22   Vassie Loll, MD  furosemide (LASIX) 20 MG tablet TAKE 2 TABLETS BY MOUTH DAILY, IF YOU NOTICE ANY SWELLING. 08/01/22   Lennette Bihari, MD  isosorbide mononitrate (IMDUR) 60 MG 24 hr tablet TAKE 1 AND 1/2 TABLETS BY MOUTH  IN THE MORNING AND ONE-HALF  TABLET BY MOUTH AT NIGHT 08/20/22   Lennette Bihari, MD  levothyroxine (SYNTHROID) 75 MCG tablet Take 75 mcg by mouth daily before breakfast. 07/16/21   [provider]  lidocaine-prilocaine (EMLA) cream Apply 1 Application topically as needed. 12/20/21   Doreatha Massed, MD  losartan (COZAAR) 100 MG tablet TAKE 1 TABLET BY MOUTH DAILY Patient taking differently: Take 100 mg by mouth daily. 04/10/22   Lennette Bihari, MD  metoprolol tartrate (LOPRESSOR) 25 MG tablet TAKE 1 TABLET BY MOUTH IN THE  MORNING AND ONE-HALF TABLET BY  MOUTH IN THE EVENING 08/23/22   Lennette Bihari, MD  Multiple Vitamins-Minerals (PRESERVISION/LUTEIN) CAPS Take 1 capsule by mouth 2 (two) times daily.    [provider]  nitroGLYCERIN (NITROSTAT) 0.4 MG SL tablet Place 1 tablet (0.4 mg total) under the tongue every 5 (five) minutes as needed. For chest pain 02/15/22   Lennette Bihari, MD  pantoprazole (PROTONIX) 40 MG tablet TAKE 1 TABLET BY MOUTH DAILY  BEFORE BREAKFAST Patient taking differently: Take 40 mg by mouth daily. 01/28/22   Aida Raider, NP  potassium chloride SA (KLOR-CON M) 20 MEQ tablet  Take 20 mEq by mouth daily. 06/22/22   [provider]  prochlorperazine (COMPAZINE) 10 MG tablet Take  1 tablet (10 mg total) by mouth every 6 (six) hours as needed for nausea or vomiting. 12/20/21   Doreatha Massed, MD  ROCKLATAN 0.02-0.005 % SOLN Place 1 drop into the left eye at bedtime. 12/21/19   [provider]  triamcinolone (KENALOG) 0.1 % cream Apply 1 application. topically 2 (two) times daily as needed (for irritation).    [provider]  vitamin E 200 UNIT capsule Take 200 Units by mouth every evening.    [provider]    Physical Exam: Vitals:   11/17/22 0214 11/17/22 0343 11/17/22 0444 11/17/22 0600  BP:  (!) 146/57 139/62 121/65  Pulse: 91 95 (!) 105 88  Resp:  19 18 20   Temp:   99.8 F (37.7 C)   TempSrc:   Rectal   SpO2:  94% 97% 95%  Weight:      Height:       Constitutional: NAD, calm, comfortable Eyes: PERRL, lids and conjunctivae normal ENMT: Mucous membranes are somewhat dry and there is a yellowish coating on the tongue. Posterior pharynx clear of any exudate or lesions. Respiratory: clear to auscultation bilaterally, no wheezing, no crackles. Normal respiratory effort. No accessory muscle use.  Room air.  Nontender to palpation over the lateral rib cage area Cardiovascular: Regular rate and rhythm, no rubs / gallops.  Grade 2/6 systolic murmur right sternal border second intercostal space.  No extremity edema. 2+ pedal pulses.  Abdomen: no tenderness, no masses palpated. No hepatosplenomegaly. Bowel sounds positive.  Musculoskeletal: no clubbing / cyanosis. No joint deformity upper and lower extremities. Good ROM, no contractures. Normal muscle tone.  Skin: no rashes, lesions, ulcers. No induration Neurologic: CN 2-12 grossly intact. Sensation intact,  Strength 4/5 x all 4 extremities.  Psychiatric: Normal judgment and insight. Alert and oriented x 3. Normal mood.     Data Reviewed:  Sodium 132, potassium 3.6, CO2 22,  glucose 126, BUN 28, creatinine 1.09, anion gap 11, albumin 3.3, LFTs are normal, lactic acid 3.1, white count 8400, hemoglobin 9.3, MCV 112.9, platelets 44,000, neutrophils 84%, PT 15.3, INR 1.2, PTT 27  Blood cultures have been obtained by the ED staff.  Assessment and Plan: SIRS/suspected secondary to pulmonary infection Subsequently we have obtained a noncontrasted CT of the chest which has revealed a rapid enlargement of a masslike area of apparent airspace consolidation in the left lower lobe findings not consistent with malignancy.  Considerations include alveolar hemorrhage in the setting of acute pulmonary infarct or rapidly progressive pneumonia.  Clinical concern for pulmonary infarction PE protocol CT scan should be completed. Respiratory panel by RT shows no evidence of influenza, RSV or coronavirus.  Full viral panel pending After hydration lactic acid is down to 1.3, procalcitonin 0.16, CRP pending, urinary rapid strep pending Due to concerns over possible pulmonary infarct will check D-dimer-at this is up can obtain a contrasted CT of the chest to clarify if patient has pulmonary embolism.  No edema of the legs but this is not a specific indicator for DVT. Have initiated broad-spectrum antibiotics with vancomycin, cefepime and Diflucan Initiate pulmonary toileting and focus on early mobilization No current evidence of sepsis physiology  Progressive thrombocytopenia/multiple myeloma on chemotherapy Patient is on 2 chemotherapeutic agents and platelets have been trending downward since 6/20 6191 normal at 156,000 and as of today they are 44,000 Anticipate patient will need to hold his Velcade and his Darzalex until these lab values improve Will add patient's primary hematologist is consulted-last evaluated on 7/3 by  hematology.  Last scheduled treatment held on 6/26 secondary to diarrhea plan.  He was given cycle 9 on 7/3. Not a candidate for pharmacological DVT prophylaxis Continue  prophylactic acyclovir Was on Decadron 10 mg weekly while on chemotherapy  Mild hyponatremia Was on loop diuretic Lasix prior to admission Also appears volume depleted Lactic acid has improved with administration of IV fluids Continue gentle IV fluid hydration with normal saline  Macrocytic anemia secondary to myeloma and chemo Current hemoglobin stable at 9.3  Hypertension Blood pressure modestly elevated Resume home medications (Cozaar)  Dyslipidemia LFTs are normal Resume home medication (Lipitor)  CAD/CHF No reports of chest pain or other cardiac symptoms Resume home medication (Imdur/Lopressor/Cozaar/ASA) Last echocardiogram January 2024 with preserved EF unable to evaluate for diastolic dysfunction  Hypothyroidism Resume Synthroid    Advance Care Planning:   Code Status: DNR   VTE prophylaxis: SCDs  Consults: Hematologist added to consult list  Family Communication: Patient only  Severity of Illness: The appropriate patient status for this patient is INPATIENT. Inpatient status is judged to be reasonable and necessary in order to provide the required intensity of service to ensure the patient's safety. The patient's presenting symptoms, physical exam findings, and initial radiographic and laboratory data in the context of their chronic comorbidities is felt to place them at high risk for further clinical deterioration. Furthermore, it is not anticipated that the patient will be medically stable for discharge from the hospital within 2 midnights of admission.   * I certify that at the point of admission it is my clinical judgment that the patient will require inpatient hospital care spanning beyond 2 midnights from the point of admission due to high intensity of service, high risk for further deterioration and high frequency of surveillance required.*  Author: Junious Silk, NP 11/17/2022 8:36 AM  For on call review [email or web address].

## 2022-11-17 NOTE — Progress Notes (Signed)
Pharmacy Antibiotic Note  Dale [name] is a 89 y.o. male hx CAD, CHF, MM admitted on 11/17/2022 with pneumonia.  Pharmacy has been consulted for Vancomycin and Cefepime dosing.  Plan: Vancomycin 1gm x 1, then Vancomycin 750mg  IV q24 Cefepime 2gm IV q12 Monitor renal fxn, cultures, and clinical course (Predicted AUC 514, Vd 0.72l/kg, ke 0.034)  Height: 5\' 8"  (172.7 cm) Weight: 59 kg (130 lb 1.1 oz) IBW/kg (Calculated) : 68.4  Temp (24hrs), Avg:99.9 F (37.7 C), Min:97.7 F (36.5 C), Max:103.2 F (39.6 C)  Recent Labs  Lab 11/13/22 0814 11/17/22 0240 11/17/22 0520  WBC 4.5 8.4  --   CREATININE 0.79 1.09  --   LATICACIDVEN  --  3.1* 1.3    Estimated Creatinine Clearance: 36.1 mL/min (by C-G formula based on SCr of 1.09 mg/dL).    Allergies  Allergen Reactions   Azithromycin Other (See Comments)    Sore mouth and fever blisters around mouth, sores in nose area as well   Doxazosin Shortness Of Breath   Atenolol Other (See Comments)    UNKNOWN REACTION   Hydrocodone Nausea And Vomiting   Levofloxacin Other (See Comments)    Caused stomach problems.   Morphine Other (See Comments)    "made me crazy"   Penicillins Nausea And Vomiting and Other (See Comments)    Has patient had a PCN reaction causing immediate rash, facial/tongue/throat swelling, SOB or lightheadedness with hypotension: No Has patient had a PCN reaction causing severe rash involving mucus membranes or skin necrosis: No Has patient had a PCN reaction that required hospitalization No Has patient had a PCN reaction occurring within the last 10 years: No If all of the above answers are "NO", then may proceed with Cephalosporin use.    Sulfonamide Derivatives Nausea And Vomiting    Antimicrobials this admission: 7/14 rocephin x1  Microbiology results: 7/14 BCx: P 7/14 MRSA PCR: P  Thank you for allowing pharmacy to be a part of this patient's care.  Caryl Asp [facility] 11/17/2022 8:57 AM

## 2022-11-17 NOTE — ED Provider Notes (Signed)
AP-EMERGENCY DEPT [facility] Emergency Department Provider Note MRN:  [id number]  Arrival date & time: 11/17/22     Chief Complaint   Weakness   History of Present Illness   Dale [name] is a 89 y.o. year-old male with a history of CAD, CHF, multiple myeloma presenting to the ED with chief complaint of weakness.  Found by wife on the floor kneeling beside the bed.  Increased weakness recently currently on chemotherapy.  Found to be febrile.  Review of Systems  A thorough review of systems was obtained and all systems are negative except as noted in the HPI and PMH.   Patient's Health History    Past Medical History:  Diagnosis Date   Allergic rhinitis    Anal fissure    Anginal pain (HCC)    Asthma    Cancer (HCC)    skin   Chest pain    CHF (congestive heart failure) (HCC)    Coronary heart disease    s/p stenting. cath in 01/2012 noncritical occlusion   Dyspnea    Dysrhythmia    1st degree heart block   GERD (gastroesophageal reflux disease)    Glaucoma    Hiatal hernia    Hyperlipidemia    Hypertension    Hypothyroidism    Idiopathic thrombocytopenic purpura (HCC) 2002   Macular degeneration    Multiple myeloma (HCC) 01/15/2021   Nephrolithiasis    PUD (peptic ulcer disease)    remote   Sarcoidosis    pulmonary   Schatzki's ring     Past Surgical History:  Procedure Laterality Date   cardiac stents     COLONOSCOPY  10/30/2006   Normal rectum, sigmoid diverticula.Remainder of colonic mucosa appeared normal.   CORONARY ANGIOPLASTY WITH STENT PLACEMENT     about 10 years ago per pt (around 2007)   CYSTOSCOPY WITH RETROGRADE PYELOGRAM, URETEROSCOPY AND STENT PLACEMENT Left 06/16/2017   Procedure: CYSTOSCOPY WITH RETROGRADE PYELOGRAM, URETEROSCOPY,STONE EXTRACTION  AND STENT PLACEMENT;  Surgeon: Marcine Matar, MD;  Location: [facility];  Service: Urology;  Laterality: Left;   ESOPHAGOGASTRODUODENOSCOPY  06/19/2004   Two esophageal rings and  esophageal web as described above.  All of these were disrupted by passing 56-French Elease Hashimoto dilator/ Candida esophagitis,which appears to be incidental given history of   antibiotic use, but nevertheless will be treated.   ESOPHAGOGASTRODUODENOSCOPY  10/30/2006   Distal tandem esophageal ring status post dilation disruption as  described above.  Otherwise normal esophagus/  Small hiatal hernia otherwise normal stomach, D1 and D2   ESOPHAGOGASTRODUODENOSCOPY N/A 03/22/2015   Dr.Rourk- noncritical schatzki's ring and hiatal hernia-o/w normal EGD.    ESOPHAGOGASTRODUODENOSCOPY (EGD) WITH ESOPHAGEAL DILATION  03/04/2012   RMR- schatzki's ring, hiatal hernia, polypoid gastric mucosa, bx= minimally active gastritis.   HOLMIUM LASER APPLICATION Left 06/16/2017   Procedure: HOLMIUM LASER APPLICATION;  Surgeon: Marcine Matar, MD;  Location: [facility];  Service: Urology;  Laterality: Left;   IR CV LINE INJECTION  12/27/2021   IR GENERIC HISTORICAL  03/06/2016   IR RADIOLOGIST EVAL & MGMT 03/06/2016 Irish Lack, MD [facility] INTERV RAD   IR GENERIC HISTORICAL  06/18/2016   IR RADIOLOGIST EVAL & MGMT 06/18/2016 Irish Lack, MD [facility] INTERV RAD   IR IMAGING GUIDED PORT INSERTION  12/18/2021   IR IMAGING GUIDED PORT INSERTION  01/15/2022   IR RADIOLOGIST EVAL & MGMT  10/01/2016   IR RADIOLOGIST EVAL & MGMT  10/15/2017   IR RADIOLOGIST EVAL & MGMT  12/24/2018   IR  RADIOLOGIST EVAL & MGMT  01/04/2020   IR RADIOLOGIST EVAL & MGMT  06/20/2021   IR REMOVAL TUN ACCESS W/ PORT W/O FL MOD SED  01/15/2022   LEFT HEART CATH N/A 02/02/2012   Procedure: LEFT HEART CATH;  Surgeon: Runell Gess, MD;  Location: [facility] CATH LAB;  Service: Cardiovascular;  Laterality: N/A;   MEDIASTINOSCOPY     for dx sarcoid   RADIOLOGY WITH ANESTHESIA Left 05/17/2016   Procedure: left renal ablation;  Surgeon: Irish Lack, MD;  Location: [facility];  Service: Radiology;  Laterality: Left;    Family History  Problem Relation Age of Onset    Heart disease Father        deceased age 43   Stroke Mother    Alzheimer's disease Mother    Heart attack Brother        deceased age 17   Cancer Other        niece   Colon cancer Neg Hx     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    Employer: RETIRED  Tobacco Use   Smoking status: Former    Current packs/day: 0.00    Average packs/day: 0.1 packs/day for 2.0 years (0.2 ttl pk-yrs)    Types: Cigarettes, Cigars    Start date: 05/06/1968    Quit date: 05/06/1970    Years since quitting: 52.5   Smokeless tobacco: Never  Vaping Use   Vaping status: Never Used  Substance and Sexual Activity   Alcohol use: No    Alcohol/week: 0.0 standard drinks of alcohol   Drug use: No   Sexual activity: Never  Other Topics Concern   Not on file  Social History Narrative   Not on file   Social Determinants of Health   Financial Resource Strain: Not on file  Food Insecurity: No Food Insecurity (06/04/2022)   Hunger Vital Sign    Worried About Running Out of Food in the Last Year: Never true    Ran Out of Food in the Last Year: Never true  Transportation Needs: No Transportation Needs (06/04/2022)   PRAPARE - Administrator, Civil Service (Medical): No    Lack of Transportation (Non-Medical): No  Physical Activity: Not on file  Stress: No Stress Concern Present (10/16/2021)   Received from [facility] of Occupational Health - Occupational Stress Questionnaire    Feeling of Stress : Not at all  Social Connections: Unknown (09/18/2021)   Received from [facility]   Social Network    Social Network: Not on file  Intimate Partner Violence: Not At Risk (06/04/2022)   Humiliation, Afraid, Rape, and Kick questionnaire    Fear  of Current or Ex-Partner: No    Emotionally Abused: No    Physically Abused: No    Sexually Abused: No     Physical Exam   Vitals:   11/17/22 0343 11/17/22 0444  BP: (!) 146/57 139/62  Pulse: 95 (!) 105  Resp: 19 18  Temp:  99.8 F (37.7 C)  SpO2: 94% 97%    CONSTITUTIONAL: Ill-appearing, NAD NEURO/PSYCH:  Alert and oriented x 3, no focal deficits EYES:  eyes equal and reactive ENT/NECK:  no  LAD, no JVD CARDIO: Regular rate, well-perfused, normal S1 and S2 PULM:  CTAB no wheezing or rhonchi GI/GU:  non-distended, mild diffuse tenderness MSK/SPINE:  No gross deformities, no edema SKIN:  no rash, atraumatic   *Additional and/or pertinent findings included in MDM below  Diagnostic and Interventional Summary    EKG Interpretation Date/Time:  November 17, 2022 at 02:15:50 Ventricular Rate:  90  PR Interval:   210 QRS Duration:   90 QT Interval:   333 QTC Calculation:  408 R Axis:      Text Interpretation: Sinus rhythm       Labs Reviewed  COMPREHENSIVE METABOLIC PANEL - Abnormal; Notable for the following components:      Result Value   Sodium 132 (*)    Glucose, Bld 126 (*)    BUN 28 (*)    Total Protein 8.5 (*)    Albumin 3.3 (*)    All other components within normal limits  LACTIC ACID, PLASMA - Abnormal; Notable for the following components:   Lactic Acid, Venous 3.1 (*)    All other components within normal limits  CBC WITH DIFFERENTIAL/PLATELET - Abnormal; Notable for the following components:   RBC 2.49 (*)    Hemoglobin 9.3 (*)    HCT 28.1 (*)    MCV 112.9 (*)    MCH 37.3 (*)    Platelets 44 (*)    Lymphs Abs 0.6 (*)    All other components within normal limits  PROTIME-INR - Abnormal; Notable for the following components:   Prothrombin Time 15.3 (*)    All other components within normal limits  URINALYSIS, W/ REFLEX TO CULTURE (INFECTION SUSPECTED) - Abnormal; Notable for the following components:   Hgb urine dipstick SMALL (*)    Protein, ur 30  (*)    All other components within normal limits  CULTURE, BLOOD (ROUTINE X 2)  RESP PANEL BY RT-PCR (RSV, FLU A&B, COVID)  [id number]  CULTURE, BLOOD (ROUTINE X 2)  LACTIC ACID, PLASMA  APTT  CK    CT ABDOMEN PELVIS W CONTRAST  Final Result    DG Chest Portable 1 View  Final Result      Medications  cefTRIAXone (ROCEPHIN) 2 g in sodium chloride 0.9 % 100 mL IVPB (0 g Intravenous Stopped 11/17/22 0440)  sodium chloride 0.9 % bolus 1,000 mL (0 mLs Intravenous Stopped 11/17/22 0440)  acetaminophen (TYLENOL) tablet 1,000 mg (1,000 mg Oral Given 11/17/22 0320)  iohexol (OMNIPAQUE) 300 MG/ML solution 100 mL (80 mLs Intravenous Contrast Given 11/17/22 0359)     Procedures  /  Critical Care .Critical Care  Performed by: Sabas Sous, MD Authorized by: Sabas Sous, MD   Critical care provider statement:    Critical care time (minutes):  35   Critical care was necessary to treat or prevent imminent or life-threatening deterioration of the following conditions:  Sepsis   Critical care was time spent personally by me on the following activities:  Development of treatment plan with patient or surrogate, discussions with consultants, evaluation of patient's response to treatment, examination of patient, ordering and review of laboratory studies, ordering and review of radiographic studies, ordering and performing treatments and interventions, pulse oximetry, re-evaluation of patient's condition and review of old charts   ED Course and Medical Decision Making  Initial Impression and Ddx Concern for sepsis, arrives febrile up to 103.2, looks unwell, heart rate 95, normotensive.  Unclear source of fever, abdomen is tender, no recent cough or burning with  urination.  He is awake and alert.  Past medical/surgical history that increases complexity of ED encounter: Multiple myeloma chemotherapy.  Interpretation of Diagnostics I personally reviewed the EKG and my interpretation is as follows:  Sinus rhythm  Labs reviewed Elevated lactic acid, mild hyponatremia.  CT scan revealing evidence of pneumonia.  Patient Reassessment and Ultimate Disposition/Management     Will admit to medicine for concern for sepsis of pulmonary origin.  Patient management required discussion with the following services or consulting groups:  Hospitalist Service  Complexity of Problems Addressed Acute illness or injury that poses threat of life of bodily function  Additional Data Reviewed and Analyzed Further history obtained from: EMS on arrival and Prior labs/imaging results  Additional Factors Impacting ED Encounter Risk Consideration of hospitalization  Elmer Sow. Pilar Plate, MD [facility] Emergency Medicine [facility] [email or web address]  Final Clinical Impressions(s) / ED Diagnoses     ICD-10-CM   1. Sepsis, due to unspecified organism, unspecified whether acute organ dysfunction present [facility])  A41.9       ED Discharge Orders     None        Discharge Instructions Discussed with and Provided to Patient:   Discharge Instructions   None      Sabas Sous, MD 11/17/22 [phone]

## 2022-11-17 NOTE — ED Triage Notes (Signed)
Pt bib EMS from home after his wife found him kneeling beside the bed. States pt has increased weakness. Pt currently undergoing chemotherapy and EMS states last treatment was Wednesday.

## 2022-11-17 NOTE — Sepsis Progress Note (Signed)
Elink following code sepsis °

## 2022-11-17 NOTE — ED Notes (Signed)
Patient transported to CT 

## 2022-11-17 NOTE — ED Notes (Signed)
Date and time results received: 11/17/22 0320  Test: LACTIC Critical Value: 3.1  Name of Provider Notified: Pilar Plate, MD

## 2022-11-18 ENCOUNTER — Inpatient Hospital Stay (HOSPITAL_COMMUNITY): Payer: Medicare Other

## 2022-11-18 DIAGNOSIS — I110 Hypertensive heart disease with heart failure: Secondary | ICD-10-CM

## 2022-11-18 DIAGNOSIS — R6510 Systemic inflammatory response syndrome (SIRS) of non-infectious origin without acute organ dysfunction: Secondary | ICD-10-CM

## 2022-11-18 DIAGNOSIS — I2510 Atherosclerotic heart disease of native coronary artery without angina pectoris: Secondary | ICD-10-CM

## 2022-11-18 DIAGNOSIS — C9000 Multiple myeloma not having achieved remission: Secondary | ICD-10-CM

## 2022-11-18 DIAGNOSIS — I509 Heart failure, unspecified: Secondary | ICD-10-CM

## 2022-11-18 DIAGNOSIS — E785 Hyperlipidemia, unspecified: Secondary | ICD-10-CM

## 2022-11-18 DIAGNOSIS — R509 Fever, unspecified: Secondary | ICD-10-CM | POA: Diagnosis not present

## 2022-11-18 DIAGNOSIS — E876 Hypokalemia: Secondary | ICD-10-CM

## 2022-11-18 DIAGNOSIS — J189 Pneumonia, unspecified organism: Secondary | ICD-10-CM

## 2022-11-18 DIAGNOSIS — D630 Anemia in neoplastic disease: Secondary | ICD-10-CM

## 2022-11-18 LAB — CBC
HCT: 26.1 % — ABNORMAL LOW (ref 39.0–52.0)
Hemoglobin: 8.6 g/dL — ABNORMAL LOW (ref 13.0–17.0)
MCH: 37.4 pg — ABNORMAL HIGH (ref 26.0–34.0)
MCHC: 33 g/dL (ref 30.0–36.0)
MCV: 113.5 fL — ABNORMAL HIGH (ref 80.0–100.0)
Platelets: 52 10*3/uL — ABNORMAL LOW (ref 150–400)
RBC: 2.3 MIL/uL — ABNORMAL LOW (ref 4.22–5.81)
WBC: 6 10*3/uL (ref 4.0–10.5)
nRBC: 0 % (ref 0.0–0.2)

## 2022-11-18 LAB — BASIC METABOLIC PANEL
Anion gap: 6 (ref 5–15)
BUN: 15 mg/dL (ref 8–23)
CO2: 21 mmol/L — ABNORMAL LOW (ref 22–32)
Calcium: 7.9 mg/dL — ABNORMAL LOW (ref 8.9–10.3)
Chloride: 100 mmol/L (ref 98–111)
Creatinine, Ser: 0.96 mg/dL (ref 0.61–1.24)
GFR, Estimated: >60 mL/min (ref >60)
Glucose, Bld: 84 mg/dL (ref 70–99)
Potassium: 3 mmol/L — ABNORMAL LOW (ref 3.5–5.1)
Sodium: 127 mmol/L — ABNORMAL LOW (ref 135–145)

## 2022-11-18 LAB — RESPIRATORY PANEL BY PCR

## 2022-11-18 LAB — MRSA NEXT GEN BY PCR, NASAL: MRSA by PCR Next Gen: NOT DETECTED

## 2022-11-18 MED ORDER — LOSARTAN POTASSIUM 50 MG PO TABS
100.0000 mg | ORAL_TABLET | Freq: Every day | ORAL | Status: DC
  Administered 2022-11-18 – 2022-11-20 (×3): 100 mg via ORAL
  Filled 2022-11-18 (×3): qty 2

## 2022-11-18 MED ORDER — POTASSIUM CHLORIDE 20 MEQ PO PACK
20.0000 meq | PACK | Freq: Once | ORAL | Status: AC
  Administered 2022-11-18: 20 meq via ORAL
  Filled 2022-11-18: qty 1

## 2022-11-18 MED ORDER — POTASSIUM CHLORIDE 20 MEQ PO PACK: 60.0000 meq | PACK | Freq: Once | ORAL | Status: DC

## 2022-11-18 MED ORDER — POTASSIUM CHLORIDE 20 MEQ PO PACK
40.0000 meq | PACK | Freq: Once | ORAL | Status: AC
  Administered 2022-11-18: 40 meq via ORAL
  Filled 2022-11-18: qty 2

## 2022-11-18 NOTE — Plan of Care (Signed)

## 2022-11-18 NOTE — Consult Note (Signed)
[facility] Consultation Oncology  Name: Dale [name]      MRN: [id number]    Location: A319/A319-01  Date: 11/18/2022 Time:5:24 PM   REFERRING PHYSICIAN: Dr. Sherryll Burger  REASON FOR CONSULT: Multiple myeloma   DIAGNOSIS: Left lower lobe pneumonia  HISTORY OF PRESENT ILLNESS: Dale [name] is a 89 year old pleasant male known to me from office visits.  He is receiving bortezomib and Darzalex and dexamethasone for multiple myeloma.  He developed abrupt onset of significant weakness, cough and white phlegm and was brought into the ER by EMS.  CT chest revealed left lower lobe consolidation.  He started on broad-spectrum IV antibiotics with vancomycin, cefepime and Diflucan.Marland Kitchen  PAST MEDICAL HISTORY:   Past Medical History:  Diagnosis Date   Allergic rhinitis    Anal fissure    Anginal pain (HCC)    Asthma    Cancer (HCC)    skin   Chest pain    CHF (congestive heart failure) (HCC)    Coronary heart disease    s/p stenting. cath in 01/2012 noncritical occlusion   Dyspnea    Dysrhythmia    1st degree heart block   GERD (gastroesophageal reflux disease)    Glaucoma    Hiatal hernia    Hyperlipidemia    Hypertension    Hypothyroidism    Idiopathic thrombocytopenic purpura (HCC) 2002   Macular degeneration    Multiple myeloma (HCC) 01/15/2021   Nephrolithiasis    PUD (peptic ulcer disease)    remote   Sarcoidosis    pulmonary   Schatzki's ring     ALLERGIES: Allergies  Allergen Reactions   Azithromycin Other (See Comments)    Sore mouth and fever blisters around mouth, sores in nose area as well   Doxazosin Shortness Of Breath   Atenolol Other (See Comments)    UNKNOWN REACTION   Hydrocodone Nausea And Vomiting   Levofloxacin Other (See Comments)    Caused stomach problems.   Morphine Other (See Comments)    "made me crazy"   Penicillins Nausea And Vomiting and Other (See Comments)    Has patient had a PCN reaction causing immediate rash, facial/tongue/throat swelling,  SOB or lightheadedness with hypotension: No Has patient had a PCN reaction causing severe rash involving mucus membranes or skin necrosis: No Has patient had a PCN reaction that required hospitalization No Has patient had a PCN reaction occurring within the last 10 years: No If all of the above answers are "NO", then may proceed with Cephalosporin use.    Sulfonamide Derivatives Nausea And Vomiting      MEDICATIONS: I have reviewed the patient's current medications.     PAST SURGICAL HISTORY Past Surgical History:  Procedure Laterality Date   cardiac stents     COLONOSCOPY  10/30/2006   Normal rectum, sigmoid diverticula.Remainder of colonic mucosa appeared normal.   CORONARY ANGIOPLASTY WITH STENT PLACEMENT     about 10 years ago per pt (around 2007)   CYSTOSCOPY WITH RETROGRADE PYELOGRAM, URETEROSCOPY AND STENT PLACEMENT Left 06/16/2017   Procedure: CYSTOSCOPY WITH RETROGRADE PYELOGRAM, URETEROSCOPY,STONE EXTRACTION  AND STENT PLACEMENT;  Surgeon: Marcine Matar, MD;  Location: [facility];  Service: Urology;  Laterality: Left;   ESOPHAGOGASTRODUODENOSCOPY  06/19/2004   Two esophageal rings and esophageal web as described above.  All of these were disrupted by passing 56-French Elease Hashimoto dilator/ Candida esophagitis,which appears to be incidental given history of   antibiotic use, but nevertheless will be treated.   ESOPHAGOGASTRODUODENOSCOPY  10/30/2006   Distal  tandem esophageal ring status post dilation disruption as  described above.  Otherwise normal esophagus/  Small hiatal hernia otherwise normal stomach, D1 and D2   ESOPHAGOGASTRODUODENOSCOPY N/A 03/22/2015   Dr.Rourk- noncritical schatzki's ring and hiatal hernia-o/w normal EGD.    ESOPHAGOGASTRODUODENOSCOPY (EGD) WITH ESOPHAGEAL DILATION  03/04/2012   RMR- schatzki's ring, hiatal hernia, polypoid gastric mucosa, bx= minimally active gastritis.   HOLMIUM LASER APPLICATION Left 06/16/2017   Procedure: HOLMIUM LASER APPLICATION;   Surgeon: Marcine Matar, MD;  Location: [facility];  Service: Urology;  Laterality: Left;   IR CV LINE INJECTION  12/27/2021   IR GENERIC HISTORICAL  03/06/2016   IR RADIOLOGIST EVAL & MGMT 03/06/2016 Irish Lack, MD [facility] INTERV RAD   IR GENERIC HISTORICAL  06/18/2016   IR RADIOLOGIST EVAL & MGMT 06/18/2016 Irish Lack, MD [facility] INTERV RAD   IR IMAGING GUIDED PORT INSERTION  12/18/2021   IR IMAGING GUIDED PORT INSERTION  01/15/2022   IR RADIOLOGIST EVAL & MGMT  10/01/2016   IR RADIOLOGIST EVAL & MGMT  10/15/2017   IR RADIOLOGIST EVAL & MGMT  12/24/2018   IR RADIOLOGIST EVAL & MGMT  01/04/2020   IR RADIOLOGIST EVAL & MGMT  06/20/2021   IR REMOVAL TUN ACCESS W/ PORT W/O FL MOD SED  01/15/2022   LEFT HEART CATH N/A 02/02/2012   Procedure: LEFT HEART CATH;  Surgeon: Runell Gess, MD;  Location: [facility] CATH LAB;  Service: Cardiovascular;  Laterality: N/A;   MEDIASTINOSCOPY     for dx sarcoid   RADIOLOGY WITH ANESTHESIA Left 05/17/2016   Procedure: left renal ablation;  Surgeon: Irish Lack, MD;  Location: [facility];  Service: Radiology;  Laterality: Left;    FAMILY HISTORY: Family History  Problem Relation Age of Onset   Heart disease Father        deceased age 60   Stroke Mother    Alzheimer's disease Mother    Heart attack Brother        deceased age 45   Cancer Other        niece   Colon cancer Neg Hx     SOCIAL HISTORY:  reports that he quit smoking about 52 years ago. His smoking use included cigarettes and cigars. He started smoking about 54 years ago. He has a 0.2 pack-year smoking history. He has never used smokeless tobacco. He reports that he does not drink alcohol and does not use drugs.  PERFORMANCE STATUS: The patient's performance status is 3 - Symptomatic, >50% confined to bed  PHYSICAL EXAM: Most Recent Vital Signs: Blood pressure (!) 126/28, pulse 61, temperature 98.5 F (36.9 C), temperature source Oral, resp. rate 19, height 5\' 8"  (1.727 m), weight 130 lb 1.1 oz  (59 kg), SpO2 99%. BP (!) 126/28 (BP Location: Right Arm)   Pulse 61   Temp 98.5 F (36.9 C) (Oral)   Resp 19   Ht 5\' 8"  (1.727 m)   Wt 130 lb 1.1 oz (59 kg)   SpO2 99%   BMI 19.78 kg/m  General appearance: alert, cooperative, and appears stated age Lungs: clear to auscultation bilaterally Heart: regular rate and rhythm Abdomen: Nontender, no palpable organomegaly Extremities:  No edema or cyanosis  LABORATORY DATA:  Results for orders placed or performed during the hospital encounter of 11/17/22 (from the past 48 hour(s))  Comprehensive metabolic panel     Status: Abnormal   Collection Time: 11/17/22  2:40 AM  Result Value Ref Range   Sodium 132 (L) 135 -  145 mmol/L   Potassium 3.6 3.5 - 5.1 mmol/L   Chloride 99 98 - 111 mmol/L   CO2 22 22 - 32 mmol/L   Glucose, Bld 126 (H) 70 - 99 mg/dL    Comment: Glucose reference range applies only to samples taken after fasting for at least 8 hours.   BUN 28 (H) 8 - 23 mg/dL   Creatinine, Ser 0.98 0.61 - 1.24 mg/dL   Calcium 8.9 8.9 - 11.9 mg/dL   Total Protein 8.5 (H) 6.5 - 8.1 g/dL   Albumin 3.3 (L) 3.5 - 5.0 g/dL   AST 15 15 - 41 U/L   ALT 24 0 - 44 U/L   Alkaline Phosphatase 67 38 - 126 U/L   Total Bilirubin 0.8 0.3 - 1.2 mg/dL   GFR, Estimated >14 >78 mL/min    Comment: (NOTE) Calculated using the CKD-EPI Creatinine Equation (2021)    Anion gap 11 5 - 15    Comment: Performed at [facility], [address] [place], [place] [address]  Lactic acid, plasma     Status: Abnormal   Collection Time: 11/17/22  2:40 AM  Result Value Ref Range   Lactic Acid, Venous 3.1 (HH) 0.5 - 1.9 mmol/L    Comment: CRITICAL RESULT CALLED TO, READ BACK BY AND VERIFIED WITH SAPPELT,J @ 0320 ON 11/17/22 BY JUW Performed at [facility], [address], [place], [place] [address]   CBC with Differential     Status: Abnormal   Collection Time: 11/17/22  2:40 AM  Result Value Ref Range   WBC 8.4 4.0 - 10.5 K/uL   RBC 2.49 (L) 4.22 - 5.81 MIL/uL    Hemoglobin 9.3 (L) 13.0 - 17.0 g/dL   HCT 57.8 (L) 46.9 - 62.9 %   MCV 112.9 (H) 80.0 - 100.0 fL   MCH 37.3 (H) 26.0 - 34.0 pg   MCHC 33.1 30.0 - 36.0 g/dL   RDW Not Measured 52.8 - 15.5 %   Platelets 44 (L) 150 - 400 K/uL    Comment: SPECIMEN CHECKED FOR CLOTS PLATELET COUNT CONFIRMED BY SMEAR PLATELETS APPEAR DECREASED    nRBC 0.0 0.0 - 0.2 %   Neutrophils Relative % 84 %   Neutro Abs 7.0 1.7 - 7.7 K/uL   Lymphocytes Relative 8 %   Lymphs Abs 0.6 (L) 0.7 - 4.0 K/uL   Monocytes Relative 7 %   Monocytes Absolute 0.6 0.1 - 1.0 K/uL   Eosinophils Relative 0 %   Eosinophils Absolute 0.0 0.0 - 0.5 K/uL   Basophils Relative 0 %   Basophils Absolute 0.0 0.0 - 0.1 K/uL   Immature Granulocytes 1 %   Abs Immature Granulocytes 0.05 0.00 - 0.07 K/uL    Comment: Performed at [facility], [address] [place], [place] [address]  Protime-INR     Status: Abnormal   Collection Time: 11/17/22  2:40 AM  Result Value Ref Range   Prothrombin Time 15.3 (H) 11.4 - 15.2 seconds   INR 1.2 0.8 - 1.2    Comment: (NOTE) INR goal varies based on device and disease states. Performed at [facility], [address] [place], [place] [address]   APTT     Status: None   Collection Time: 11/17/22  2:40 AM  Result Value Ref Range   aPTT 27 24 - 36 seconds    Comment: Performed at [facility], [address] [place], [place] [address]  D-dimer, quantitative     Status: Abnormal   Collection  Time: 11/17/22  2:40 AM  Result Value Ref Range   D-Dimer, Quant 1.15 (H) 0.00 - 0.50 ug/mL-FEU    Comment: (NOTE) At the manufacturer cut-off value of 0.5 g/mL FEU, this assay has a negative predictive value of 95-100%.This assay is intended for use in conjunction with a clinical pretest probability (PTP) assessment model to exclude pulmonary embolism (PE) and deep venous thrombosis (DVT) in outpatients suspected of PE or DVT. Results should be correlated with clinical presentation. Performed at [facility], [address] [place], [place] [address]   Culture, blood (Routine x 2)     Status: None (Preliminary result)   Collection Time: 11/17/22  2:41 AM   Specimen: Left Antecubital; Blood  Result Value Ref Range   Specimen Description LEFT ANTECUBITAL    Special Requests      BOTTLES DRAWN AEROBIC AND ANAEROBIC Blood Culture adequate volume   Culture      NO GROWTH 1 DAY Performed at [facility], [address], [place], [place] [address]    Report Status PENDING   Resp panel by RT-PCR (RSV, Flu A&B, Covid) Anterior Nasal Swab     Status: None   Collection Time: 11/17/22  3:00 AM   Specimen: Anterior Nasal Swab  Result Value Ref Range   SARS Coronavirus 2 by RT PCR NEGATIVE NEGATIVE    Comment: (NOTE) SARS-CoV-2 target nucleic acids are NOT DETECTED.  The SARS-CoV-2 RNA is generally detectable in upper respiratory specimens during the acute phase of infection. The lowest concentration of SARS-CoV-2 viral copies this assay can detect is 138 copies/mL. A negative result does not preclude SARS-Cov-2 infection and should not be used as the sole basis for treatment or other patient management decisions. A negative result may occur with  improper specimen collection/handling, submission of specimen other than nasopharyngeal swab, presence of viral mutation(s) within the areas targeted by this assay, and inadequate number of viral copies(<138 copies/mL). A negative result must be combined with clinical observations, patient history, and epidemiological information. The expected result is Negative.  Fact Sheet for Patients:  BloggerCourse.com  Fact Sheet for Healthcare Providers:  SeriousBroker.it  This test is no t yet approved or cleared by the [place] FDA and  has been authorized for detection and/or diagnosis of SARS-CoV-2 by FDA under an Emergency Use Authorization (EUA). This EUA will remain  in effect (meaning this test  can be used) for the duration of the COVID-19 declaration under Section 564(b)(1) of the Act, 21 U.S.C.section 360bbb-3(b)(1), unless the authorization is terminated  or revoked sooner.       Influenza A by PCR NEGATIVE NEGATIVE   Influenza B by PCR NEGATIVE NEGATIVE    Comment: (NOTE) The Xpert Xpress SARS-CoV-2/FLU/RSV plus assay is intended as an aid in the diagnosis of influenza from Nasopharyngeal swab specimens and should not be used as a sole basis for treatment. Nasal washings and aspirates are unacceptable for Xpert Xpress SARS-CoV-2/FLU/RSV testing.  Fact Sheet for Patients: BloggerCourse.com  Fact Sheet for Healthcare Providers: SeriousBroker.it  This test is not yet approved or cleared by the [place] FDA and has been authorized for detection and/or diagnosis of SARS-CoV-2 by FDA under an Emergency Use Authorization (EUA). This EUA will remain in effect (meaning this test can be used) for the duration of the COVID-19 declaration under Section 564(b)(1) of the Act, 21 U.S.C. section 360bbb-3(b)(1), unless the authorization is terminated or revoked.     Resp Syncytial Virus by PCR NEGATIVE NEGATIVE  Comment: (NOTE) Fact Sheet for Patients: BloggerCourse.com  Fact Sheet for Healthcare Providers: SeriousBroker.it  This test is not yet approved or cleared by the [place] FDA and has been authorized for detection and/or diagnosis of SARS-CoV-2 by FDA under an Emergency Use Authorization (EUA). This EUA will remain in effect (meaning this test can be used) for the duration of the COVID-19 declaration under Section 564(b)(1) of the Act, 21 U.S.C. section 360bbb-3(b)(1), unless the authorization is terminated or revoked.  Performed at [facility], [address] [place], [place] [address]   Culture, blood (Routine x 2)     Status: None (Preliminary result)    Collection Time: 11/17/22  3:08 AM   Specimen: BLOOD RIGHT FOREARM  Result Value Ref Range   Specimen Description BLOOD RIGHT FOREARM    Special Requests      BOTTLES DRAWN AEROBIC AND ANAEROBIC Blood Culture adequate volume   Culture      NO GROWTH 1 DAY Performed at [facility], [address], [place], [place] [address]    Report Status PENDING   Urinalysis, w/ Reflex to Culture (Infection Suspected) -Urine, Clean Catch     Status: Abnormal   Collection Time: 11/17/22  3:22 AM  Result Value Ref Range   Specimen Source URINE, CATHETERIZED    Color, Urine YELLOW YELLOW   APPearance CLEAR CLEAR   Specific Gravity, Urine 1.014 1.005 - 1.030   pH 6.0 5.0 - 8.0   Glucose, UA NEGATIVE NEGATIVE mg/dL   Hgb urine dipstick SMALL (A) NEGATIVE   Bilirubin Urine NEGATIVE NEGATIVE   Ketones, ur NEGATIVE NEGATIVE mg/dL   Protein, ur 30 (A) NEGATIVE mg/dL   Nitrite NEGATIVE NEGATIVE   Leukocytes,Ua NEGATIVE NEGATIVE   RBC / HPF 21-50 0 - 5 RBC/hpf   WBC, UA 0-5 0 - 5 WBC/hpf    Comment:        Reflex urine culture not performed if WBC <=10, OR if Squamous epithelial cells >5. If Squamous epithelial cells >5 suggest recollection.    Bacteria, UA NONE SEEN NONE SEEN   Squamous Epithelial / HPF 0-5 0 - 5 /HPF   Mucus PRESENT     Comment: Performed at [facility], [address], [place], [place] [address]  Strep pneumoniae urinary antigen     Status: None   Collection Time: 11/17/22  3:22 AM  Result Value Ref Range   Strep Pneumo Urinary Antigen NEGATIVE NEGATIVE    Comment:        Infection due to S. pneumoniae cannot be absolutely ruled out since the antigen present may be below the detection limit of the test. Performed at [facility], [address] [place], [place] [address]   Lactic acid, plasma     Status: None   Collection Time: 11/17/22  5:20 AM  Result Value Ref Range   Lactic Acid, Venous 1.3 0.5 - 1.9 mmol/L    Comment: Performed at [facility],  [address], [place], [place] [address]  CK     Status: None   Collection Time: 11/17/22  5:20 AM  Result Value Ref Range   Total CK 266 49 - 397 U/L    Comment: Performed at [facility], [address], [place], [place] [address]  Procalcitonin     Status: None   Collection Time: 11/17/22  8:58 AM  Result Value Ref Range   Procalcitonin 0.16 ng/mL    Comment:        Interpretation: PCT (Procalcitonin) <=  0.5 ng/mL: Systemic infection (sepsis) is not likely. Local bacterial infection is possible. (NOTE)       Sepsis PCT Algorithm           Lower Respiratory Tract                                      Infection PCT Algorithm    ----------------------------     ----------------------------         PCT < 0.25 ng/mL                PCT < 0.10 ng/mL          Strongly encourage             Strongly discourage   discontinuation of antibiotics    initiation of antibiotics    ----------------------------     -----------------------------       PCT 0.25 - 0.50 ng/mL            PCT 0.10 - 0.25 ng/mL               OR       >80% decrease in PCT            Discourage initiation of                                            antibiotics      Encourage discontinuation           of antibiotics    ----------------------------     -----------------------------         PCT >= 0.50 ng/mL              PCT 0.26 - 0.50 ng/mL               AND        <80% decrease in PCT             Encourage initiation of                                             antibiotics       Encourage continuation           of antibiotics    ----------------------------     -----------------------------        PCT >= 0.50 ng/mL                  PCT > 0.50 ng/mL               AND         increase in PCT                  Strongly encourage                                      initiation of antibiotics    Strongly encourage escalation           of antibiotics                                     -----------------------------  PCT <= 0.25 ng/mL                                                 OR                                        > 80% decrease in PCT                                      Discontinue / Do not initiate                                             antibiotics  Performed at [facility], [address], [place], [place] [address]   C-reactive protein     Status: Abnormal   Collection Time: 11/17/22  8:58 AM  Result Value Ref Range   CRP 3.5 (H) <1.0 mg/dL    Comment: Performed at [facility], [address], [place], [place] [address]  SARS Coronavirus 2 by RT PCR (hospital order, performed in [facility] hospital lab) *cepheid single result test* Anterior Nasal Swab     Status: None   Collection Time: 11/17/22 10:04 AM   Specimen: Anterior Nasal Swab  Result Value Ref Range   SARS Coronavirus 2 by RT PCR NEGATIVE NEGATIVE    Comment: (NOTE) SARS-CoV-2 target nucleic acids are NOT DETECTED.  The SARS-CoV-2 RNA is generally detectable in upper and lower respiratory specimens during the acute phase of infection. The lowest concentration of SARS-CoV-2 viral copies this assay can detect is 250 copies / mL. A negative result does not preclude SARS-CoV-2 infection and should not be used as the sole basis for treatment or other patient management decisions.  A negative result may occur with improper specimen collection / handling, submission of specimen other than nasopharyngeal swab, presence of viral mutation(s) within the areas targeted by this assay, and inadequate number of viral copies (<250 copies / mL). A negative result must be combined with clinical observations, patient history, and epidemiological information.  Fact Sheet for Patients:   RoadLapTop.co.za  Fact Sheet for Healthcare Providers: http://kim-miller.com/  This test is not yet approved or  cleared by the [place] FDA and has been  authorized for detection and/or diagnosis of SARS-CoV-2 by FDA under an Emergency Use Authorization (EUA).  This EUA will remain in effect (meaning this test can be used) for the duration of the COVID-19 declaration under Section 564(b)(1) of the Act, 21 U.S.C. section 360bbb-3(b)(1), unless the authorization is terminated or revoked sooner.  Performed at [facility], [address] [place], [place] [address]   Respiratory (~20 pathogens) panel by PCR     Status: None   Collection Time: 11/17/22  6:40 PM   Specimen: Nasopharyngeal Swab; Respiratory  Result Value Ref Range   Adenovirus NOT DETECTED NOT DETECTED   Coronavirus 229E NOT DETECTED NOT DETECTED    Comment: (NOTE) The Coronavirus on the Respiratory Panel, DOES NOT test for the novel  Coronavirus (2019 nCoV)    Coronavirus HKU1 NOT DETECTED NOT  DETECTED   Coronavirus NL63 NOT DETECTED NOT DETECTED   Coronavirus OC43 NOT DETECTED NOT DETECTED   Metapneumovirus NOT DETECTED NOT DETECTED   Rhinovirus / Enterovirus NOT DETECTED NOT DETECTED   Influenza A NOT DETECTED NOT DETECTED   Influenza B NOT DETECTED NOT DETECTED   Parainfluenza Virus 1 NOT DETECTED NOT DETECTED   Parainfluenza Virus 2 NOT DETECTED NOT DETECTED   Parainfluenza Virus 3 NOT DETECTED NOT DETECTED   Parainfluenza Virus 4 NOT DETECTED NOT DETECTED   Respiratory Syncytial Virus NOT DETECTED NOT DETECTED   Bordetella pertussis NOT DETECTED NOT DETECTED   Bordetella Parapertussis NOT DETECTED NOT DETECTED   Chlamydophila pneumoniae NOT DETECTED NOT DETECTED   Mycoplasma pneumoniae NOT DETECTED NOT DETECTED    Comment: Performed at [facility], [address], [place], [place] [address]  CBC     Status: Abnormal   Collection Time: 11/18/22  5:14 AM  Result Value Ref Range   WBC 6.0 4.0 - 10.5 K/uL   RBC 2.30 (L) 4.22 - 5.81 MIL/uL   Hemoglobin 8.6 (L) 13.0 - 17.0 g/dL   HCT 19.1 (L) 47.8 - 29.5 %   MCV 113.5 (H) 80.0 - 100.0 fL   MCH 37.4 (H) 26.0  - 34.0 pg   MCHC 33.0 30.0 - 36.0 g/dL   RDW Not Measured 62.1 - 15.5 %   Platelets 52 (L) 150 - 400 K/uL    Comment: SPECIMEN CHECKED FOR CLOTS Immature Platelet Fraction may be clinically indicated, consider ordering this additional test [id number] CONSISTENT WITH PREVIOUS RESULT    nRBC 0.0 0.0 - 0.2 %    Comment: Performed at [facility], [address], [place], [place] [address]  Basic metabolic panel     Status: Abnormal   Collection Time: 11/18/22  5:14 AM  Result Value Ref Range   Sodium 127 (L) 135 - 145 mmol/L   Potassium 3.0 (L) 3.5 - 5.1 mmol/L   Chloride 100 98 - 111 mmol/L   CO2 21 (L) 22 - 32 mmol/L   Glucose, Bld 84 70 - 99 mg/dL    Comment: Glucose reference range applies only to samples taken after fasting for at least 8 hours.   BUN 15 8 - 23 mg/dL   Creatinine, Ser 5.28 0.61 - 1.24 mg/dL   Calcium 7.9 (L) 8.9 - 10.3 mg/dL   GFR, Estimated >41 >32 mL/min    Comment: (NOTE) Calculated using the CKD-EPI Creatinine Equation (2021)    Anion gap 6 5 - 15    Comment: Performed at [facility], [address], [place], [place] [address]   *Note: Due to a large number of results and/or encounters for the requested time period, some results have not been displayed. A complete set of results can be found in Results Review.      RADIOGRAPHY: US Venous Img Lower Bilateral (DVT)  Result Date: 11/18/2022 CLINICAL DATA:  Elevated D-dimer. EXAM: BILATERAL LOWER EXTREMITY VENOUS DOPPLER ULTRASOUND TECHNIQUE: Gray-scale sonography with graded compression, as well as color Doppler and duplex ultrasound were performed to evaluate the lower extremity deep venous systems from the level of the common femoral vein and including the common femoral, femoral, profunda femoral, popliteal and calf veins including the posterior tibial, peroneal and gastrocnemius veins when visible. The superficial great saphenous vein was also interrogated. Spectral Doppler was utilized to evaluate flow at  rest and with distal augmentation maneuvers in the common femoral, femoral and popliteal veins. COMPARISON:  None Available. FINDINGS:  RIGHT LOWER EXTREMITY Common Femoral Vein: No evidence of thrombus. Normal compressibility, respiratory phasicity and response to augmentation. Saphenofemoral Junction: No evidence of thrombus. Normal compressibility and flow on color Doppler imaging. Profunda Femoral Vein: No evidence of thrombus. Normal compressibility and flow on color Doppler imaging. Femoral Vein: No evidence of thrombus. Normal compressibility, respiratory phasicity and response to augmentation. Popliteal Vein: No evidence of thrombus. Normal compressibility, respiratory phasicity and response to augmentation. Calf Veins: Visualized right deep calf veins are patent without thrombus. Other Findings:  None. LEFT LOWER EXTREMITY Common Femoral Vein: No evidence of thrombus. Normal compressibility, respiratory phasicity and response to augmentation. Saphenofemoral Junction: No evidence of thrombus. Normal compressibility and flow on color Doppler imaging. Profunda Femoral Vein: No evidence of thrombus. Normal compressibility and flow on color Doppler imaging. Femoral Vein: No evidence of thrombus. Normal compressibility, respiratory phasicity and response to augmentation. Popliteal Vein: No evidence of thrombus. Normal compressibility, respiratory phasicity and response to augmentation. Calf Veins: Visualized left deep calf veins are patent without thrombus. Other Findings:  None. IMPRESSION: No evidence of deep venous thrombosis in either lower extremity. Electronically Signed   By: Richarda Overlie M.D.   On: 11/18/2022 12:02   CT CHEST WO CONTRAST  Result Date: 11/17/2022 CLINICAL DATA:  89 year old male with history of pleural mass. History of multiple myeloma. * Tracking Code: BO * EXAM: CT CHEST WITHOUT CONTRAST TECHNIQUE: Multidetector CT imaging of the chest was performed following the standard protocol  without IV contrast. RADIATION DOSE REDUCTION: This exam was performed according to the departmental dose-optimization program which includes automated exposure control, adjustment of the mA and/or kV according to patient size and/or use of iterative reconstruction technique. COMPARISON:  CTA of the chest 02/06/2022. CT of the abdomen and pelvis 11/17/2022. FINDINGS: Cardiovascular: Heart size is borderline enlarged. There is no significant pericardial fluid, thickening or pericardial calcification. There is aortic atherosclerosis, as well as atherosclerosis of the great vessels of the mediastinum and the coronary arteries, including calcified atherosclerotic plaque in the left main, left anterior descending, left circumflex and right coronary arteries. Mild calcifications of the aortic valve. Left-sided single-lumen Port-A-Cath with tip terminating at the superior cavoatrial junction. Mediastinum/Nodes: No pathologically enlarged mediastinal or hilar lymph nodes. Please note that accurate exclusion of hilar adenopathy is limited on noncontrast CT scans. Several densely calcified mediastinal lymph nodes are incidentally noted. Esophagus is unremarkable in appearance. No axillary lymphadenopathy. Lungs/Pleura: Enlarging mass-like area of consolidation in the periphery of the left lung base, currently measuring 5.1 x 2.8 cm (recently 2.6 x 2.0 cm on earlier contemporaneously obtained CT of the abdomen and pelvis). Some surrounding ground-glass attenuation also noted posteriorly in the left lower lobe. Trace left pleural effusion lying dependently. Scattered areas of chronic post infectious or inflammatory scarring are again noted in the posterior aspect of the right lung, similar to the prior examination from 02/06/2022 where there are extensive areas of septal thickening, thickening of the peribronchovascular interstitium, cylindrical bronchiectasis, peripheral bronchiolectasis and nodular areas of scarring and  architectural distortion. Upper Abdomen: See separate dictation for recently performed CT of the abdomen and pelvis for full description of findings below the diaphragm. Musculoskeletal: There are no aggressive appearing lytic or blastic lesions noted in the visualized portions of the skeleton. IMPRESSION: 1. Rapid enlargement of mass-like area of apparent airspace consolidation in the left lower lobe. Findings do not suggest malignancy. Primary differential considerations include alveolar hemorrhage in the setting of acute pulmonary infarct, or rapidly progressive pneumonia. Further clinical  evaluation is recommended. If there is clinical concern for pulmonary infarction, repeat PE protocol CT scan should be considered. 2. Trace left pleural effusion lying dependently. 3. 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 4. Aortic atherosclerosis, in addition to left main and three-vessel coronary artery disease. 5. There are calcifications of the aortic valve. Echocardiographic correlation for evaluation of potential valvular dysfunction may be warranted if clinically indicated. Aortic Atherosclerosis (ICD10-I70.0). Electronically Signed   By: Trudie Reed M.D.   On: 11/17/2022 10:14   CT ABDOMEN PELVIS W CONTRAST  Result Date: 11/17/2022 CLINICAL DATA:  89 year old male history of weakness. Possible sepsis. Currently undergoing chemotherapy. History of multiple myeloma. * Tracking Code: BO * EXAM: CT ABDOMEN AND PELVIS WITH CONTRAST TECHNIQUE: Multidetector CT imaging of the abdomen and pelvis was performed using the standard protocol following bolus administration of intravenous contrast. RADIATION DOSE REDUCTION: This exam was performed according to the departmental dose-optimization program which includes automated exposure control, adjustment of the mA and/or kV according to patient size and/or use of iterative reconstruction technique. CONTRAST:   80mL OMNIPAQUE IOHEXOL 300 MG/ML  SOLN COMPARISON:  CT of the chest, abdomen and pelvis 01/16/2022. FINDINGS: Lower chest: In the periphery of the left lower lobe (axial image 9 of series 4) there is a new mass-like area measuring 2.6 x 2.0 cm. Some dependent ground-glass attenuation and focal volume loss and architectural distortion is noted in the surrounding lung parenchyma in the base of the left lower lobe. Atherosclerotic calcifications are noted in the descending thoracic aorta as well as the left main, left anterior descending, left circumflex and right coronary arteries. Hepatobiliary: Low-attenuation lesion which measures 3.7 cm in the left lobe between segments 2 and 3 (axial image 22 of series 2), compatible with a simple cyst (no imaging follow-up recommended). Other 12 mm intermediate attenuation (34 HU) lesion between segment 4A and 8 (axial image 17 of series 2), incompletely characterize, but stable compared to the prior study, presumably a benign lesion such as a mildly proteinaceous cyst. No other aggressive appearing hepatic lesions are noted. No intra or extrahepatic biliary ductal dilatation. Gallbladder is unremarkable in appearance. Pancreas: No pancreatic mass. No pancreatic ductal dilatation. No pancreatic or peripancreatic fluid collections or inflammatory changes. Spleen: Unremarkable. Adrenals/Urinary Tract: Nonobstructive calculi are noted within the collecting systems of both kidneys measuring up to 4 mm in the interpolar region of the right kidney. Mild cortical thinning in the interpolar region of the left kidney, presumably chronic post infectious or inflammatory scarring. Subcentimeter low-attenuation lesions in both kidneys, too small to definitively characterize, but statistically likely tiny cysts (no imaging follow-up recommended). No aggressive appearing renal lesions are noted. No hydroureteronephrosis. Urinary bladder is unremarkable in appearance. Bilateral adrenal glands  are normal in appearance. Stomach/Bowel: The appearance of the stomach is normal. No pathologic dilatation of small bowel or colon. Numerous colonic diverticula are noted, without surrounding inflammatory changes to indicate an acute diverticulitis at this time. Normal appendix. Vascular/Lymphatic: Atherosclerotic calcifications throughout the abdominal aorta and pelvic vasculature, without evidence of aneurysm or dissection. No lymphadenopathy noted in the abdomen or pelvis. Reproductive: Prostate gland and seminal vesicles are unremarkable in appearance. Other: Small right inguinal hernia containing only fat. No significant volume of ascites. No pneumoperitoneum. Musculoskeletal: There are no aggressive appearing lytic or blastic lesions noted in the visualized portions of the skeleton. IMPRESSION: 1. No acute findings are noted in the abdomen or pelvis to account for the patient's  symptoms. 2. However, there is a new mass-like opacity in the base of the left lower lobe, with surrounding ground-glass attenuation in the adjacent left lower lobe. This is potentially of infectious or inflammatory etiology, however, underlying neoplasm is difficult to entirely exclude. Short-term follow-up noncontrast chest CT is suggested in 1 month after trial of antimicrobial therapy to ensure regression of this finding. 3. Colonic diverticulosis without evidence of acute diverticulitis at this time. 4. Nonobstructive calculi in the collecting systems of both kidneys measuring up to 4 mm in the interpolar region of the right kidney. 5. Right inguinal hernia containing only fat. No evidence of associated bowel incarceration or obstruction at this time. 6. Aortic atherosclerosis, in addition to left main and three-vessel coronary artery disease. 7. Additional incidental findings, as above. Electronically Signed   By: Trudie Reed M.D.   On: 11/17/2022 05:18   DG Chest Portable 1 View  Result Date: 11/17/2022 CLINICAL DATA:   Possible sepsis EXAM: PORTABLE CHEST 1 VIEW COMPARISON:  10/07/2022 FINDINGS: Cardiac shadow is stable. Aortic calcifications are noted. Left chest wall port is again seen. Lungs are well aerated bilaterally. No focal infiltrate or sizable effusion is seen. Chronic scarring is again noted bilaterally. No bony abnormality is seen. IMPRESSION: Scarring without acute abnormality. Electronically Signed   By: Alcide Clever M.D.   On: 11/17/2022 03:34         ASSESSMENT and PLAN:  1.  Pneumonia: - CT chest (11/17/2022): Left lower lobe consolidation. - Continue broad-spectrum antibiotics including vancomycin, cefepime and Diflucan. - Follow-up on blood cultures.  2.  Thrombocytopenia: - Likely from combination of sepsis and myeloma treatments. - No bleeding issues.  Transfuse if active bleeding.  3.  IgG lambda plasma cell myeloma: - He is on weekly Velcade and Darzalex, 3 weeks on 1 week off. - Last treatment on 11/13/2022. - Will hold further treatment until infection resolved.  All questions were answered. The patient knows to call the clinic with any problems, questions or concerns. We can certainly see the patient much sooner if necessary.    Doreatha Massed

## 2022-11-18 NOTE — TOC Initial Note (Signed)
Transition of Care [facility]) - Initial/Assessment Note    Patient Details  Name: Dale [name] MRN: [id number] Date of Birth: 02-16-1929  Transition of Care [facility]) CM/SW Contact:    Leitha Bleak, RN Phone Number: 11/18/2022, 3:30 PM  Clinical Narrative:      Patient admitted with fever. Patient has a high risk for readmission. TOC could reach family. Patient weak, states lives home with his spouse. Uses a walker as needed, mostly nights. TOC will follow for PT eval and talk with family.          Barriers to Discharge: Continued Medical Work up   Patient Goals and CMS Choice            Expected Discharge Plan and Services       Living arrangements for the past 2 months: Skilled Nursing Facility                                      Prior Living Arrangements/Services Living arrangements for the past 2 months: Skilled Nursing Facility Lives with:: Spouse                   Activities of Daily Living Home Assistive Devices/Equipment: Environmental consultant (specify type), Eyeglasses ADL Screening (condition at time of admission) Patient's cognitive ability adequate to safely complete daily activities?: Yes Is the patient deaf or have difficulty hearing?: No Does the patient have difficulty seeing, even when wearing glasses/contacts?: No Does the patient have difficulty concentrating, remembering, or making decisions?: No Patient able to express need for assistance with ADLs?: Yes Does the patient have difficulty dressing or bathing?: No Independently performs ADLs?: Yes (appropriate for developmental age) Does the patient have difficulty walking or climbing stairs?: No Weakness of Legs: Both Weakness of Arms/Hands: None  Permission Sought/Granted                  Emotional Assessment         Alcohol / Substance Use: Not Applicable Psych Involvement: No (comment)  Admission diagnosis:  Fever [R50.9] Sepsis, due to unspecified organism, unspecified whether  acute organ dysfunction present [facility]) [A41.9] Patient Active Problem List   Diagnosis Date Noted   Fever 11/17/2022   Macrocytic anemia 06/04/2022   Altered mental status 06/03/2022   Elevated d-dimer 02/07/2022   Non-cardiac chest pain    Long term (current) use of aspirin    Chest pain 02/06/2022   Syncope 01/16/2022   Anemia associated with chemotherapy 01/16/2022   Laceration of skin of right hand 01/16/2022   Laceration of skin of right forearm 01/16/2022   Laceration of skin of left forearm 01/16/2022   Protein-calorie malnutrition, severe (HCC) 07/29/2021   Hyponatremia 07/29/2021   Pancytopenia (HCC) 07/29/2021   Acute metabolic encephalopathy 07/29/2021   CAP (community acquired pneumonia) 07/27/2021   Multiple myeloma (HCC) 01/15/2021   Hypoalbuminemia due to protein-calorie malnutrition (HCC) 12/07/2020   Anemia of chronic disease 05/21/2017   Weight loss of more than 10% body weight 01/27/2017   Sinusitis 01/02/2017   Left renal mass 05/17/2016   Hiatal hernia    Schatzki's ring    Odynophagia 03/03/2015   Easy bruisability 09/26/2014   Mixed hyperlipidemia 06/28/2014   First degree AV block 10/08/2013   Atypical chest pain 02/02/2012   CAD, RCA stent, RCA new DES July 2009, Cath [place] 2011, 02/02/12 02/02/2012   Unstable angina, cath showed patent stents 02/02/12 02/02/2012  Thrombocytopenia (HCC) 02/02/2012   BPH (benign prostatic hyperplasia) 02/02/2012   Chest pain 08/15/2011   Macular degeneration 08/15/2011   History of ITP 08/15/2011   Abdominal pain 06/28/2010   DIARRHEA 06/21/2010   HEMATOCHEZIA 01/19/2009   DYSPHAGIA UNSPECIFIED 01/19/2009   Acquired hypothyroidism 01/18/2009   HYPERCHOLESTEROLEMIA 01/18/2009   Unspecified glaucoma 01/18/2009   Essential hypertension 01/18/2009   CHF 01/18/2009   SCHATZKI'S RING 01/18/2009   ANAL FISSURE 01/18/2009   NEPHROLITHIASIS 01/18/2009   PUD, HX OF 01/18/2009   Sarcoidosis 08/27/2007   Coronary  atherosclerosis 08/27/2007   Seasonal and perennial allergic rhinitis 08/27/2007   Allergic asthma, mild intermittent, uncomplicated 08/27/2007   PCP:  Pcp, No Pharmacy:   [id number] - [place], [place] - [address] [place] [address] Phone: [phone] Fax: [phone]  [facility] [phone] - , [place] - [address] AT [facility] OF [address]  [place] [address] Phone: [phone] Fax: [phone]  [facility] - [place], [place] - [address] [place] [address] Phone: [phone] Fax: [phone]     Social Determinants of Health (SDOH) Social History: SDOH Screenings   Food Insecurity: No Food Insecurity (11/17/2022)  Housing: Low Risk  (11/17/2022)  Transportation Needs: No Transportation Needs (11/17/2022)  Utilities: Not At Risk (11/17/2022)  Social Connections: Unknown (09/18/2021)   Received from [facility]  Stress: No Stress Concern Present (10/16/2021)   Received from [facility]  Tobacco Use: Medium Risk (11/17/2022)   SDOH Interventions:     Readmission Risk Interventions    11/18/2022    3:29 PM 07/30/2021   10:16 AM  Readmission Risk Prevention Plan  Transportation Screening Complete Complete  PCP or Specialist Appt within 3-5 Days Not Complete   HRI or Home Care Consult Complete Complete  Social Work Consult for Recovery Care Planning/Counseling Complete Complete  Palliative Care Screening Not Applicable Not Applicable  Medication Review Oceanographer) Complete Complete

## 2022-11-18 NOTE — Hospital Course (Addendum)
Dale [name] is a 89 y.o. 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 Ellin Saba, who presented via EMS with significant weakness  [name]' wife found him kneeling on the floor by the bed.  Patient states he has been trying to get into the bed but the bed was too tall.  In discussion with the patient he developed abrupt onset of significant weakness wet cough with white phlegm generalized weakness.  Denies shortness of breath.  He was also having some soreness in the left lateral rib cage that is not reproducible with palpation.   Upon presentation to the ED he was febrile with a rectal temperature of 103.2 (quickly normalized), WBCs 8.4k, room air sats between 93- 97%, lactic acid was elevated at 3.1 (improved to 1.3 s/p 1L bolus IVF), CRP elevated at 3.5, Procalcitonin at 0.16, D-dimer at 1.15. Plain chest film showed no acute abnormality.  7/14 CT A/P revealed a new masslike opacity in the base of the left lower lobe with surrounding groundglass attenuation in the adjacent left lower lobe likely infectious or inflammatory in etiology but neoplasm could not be entirely excluded.  Subsequent 7/14 CT Chest revealed "rapid enlargement of mass like are of apparent airspace consolidation in LLL." He was started on Cefepime, Vancomycin and Diflucan on 7/14. This morning, 7/15, a BLE Venous doppler is pending, pt is stable on RA without tachypnea, tachycardia, chest pain, or productive cough. Hypertensive off home meds.

## 2022-11-18 NOTE — Progress Notes (Addendum)
PROGRESS NOTE    Dale [name]  ZOX:[id number] DOB: May 05, 1929 DOA: 11/17/2022 PCP: Pcp, No   Brief Narrative:  Dale [name] is a 89 y.o. 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 Ellin Saba, who presented via EMS with significant weakness  Dale Vo' wife found him kneeling on the floor by the bed.  Patient states he has been trying to get into the bed but the bed was too tall.  In discussion with the patient he developed abrupt onset of significant weakness wet cough with white phlegm generalized weakness.  Denies shortness of breath.  He was also having some soreness in the left lateral rib cage that is not reproducible with palpation.   Upon presentation to the ED he was febrile with a rectal temperature of 103.2 (quickly normalized), WBCs 8.4k, room air sats between 93- 97%, lactic acid was elevated at 3.1 (improved to 1.3 s/p 1L bolus IVF), CRP elevated at 3.5, Procalcitonin at 0.16, D-dimer at 1.15. Plain chest film showed no acute abnormality.  7/14 CT A/P revealed a new masslike opacity in the base of the left lower lobe with surrounding groundglass attenuation in the adjacent left lower lobe likely infectious or inflammatory in etiology but neoplasm could not be entirely excluded.  Subsequent 7/14 CT Chest revealed "rapid enlargement of mass like are of apparent airspace consolidation in LLL." He was started on Cefepime, Vancomycin and Diflucan on 7/14. This morning, 7/15, a BLE Venous doppler is pending, pt is stable on RA without tachypnea, tachycardia, chest pain, or productive cough. Hypertensive off home meds.     Assessment & Plan:   Principal Problem:   Fever  Assessment and Plan: SIRS/suspected secondary to pulmonary infection On presentation to the ED Dale [name] was febrile to 103.2 (quickly normalized), tachypneic with  normal sats on RA, lactic acid was elevated at 3.1 (improved to 1.3 s/p 1L bolus IVF), CRP elevated at 3.5, Procalcitonin at 0.16, D-dimer at 1.15. 7/14 CXR was wnl. 7/14 CT A/P revealed a new masslike opacity in the base of the left lower lobe with surrounding groundglass attenuation in the adjacent LLL likely infectious or inflammatory in etiology but neoplasm could not be entirely excluded. 5 hour subsequent 7/14 CT Chest revealed "rapid enlargement of mass like are of apparent airspace consolidation in LLL." He was started on Cefepime, Vancomycin and Diflucan on 7/14. 7/15 with negative results on Resp path panel, Urine antigen Strep pneumo. No hemoptysis, normalized vital signs, remains on RA, denies cough, SOB, and CP. Clinically well appearing, but will continue IV Abx today given immunocompromised status from active treatment of multiple myeloma. Due to concerns over possible pulmonary infarct have a BLE Doppler pending. - Continue Vancomycin, Cefepime, Diflucan today 7/15 - Follow up BLE Doppler    Progressive thrombocytopenia/multiple myeloma on chemotherapy Presented with Plt at 44k on 7/14. Patient is on Bortezomib, Darzales and Decadron, s/p Cycle 9 on 7/3, and platelets have been trending downward since 6/26 normal at 156k.  Improved today to 52k. Anticipate patient will need to hold his Velcade and his Darzalex until these lab values improve.  - Heme/Onc consulted and appreciate recs - Not a candidate for pharmacological DVT prophylaxis - Continue prophylactic acyclovir - Continue to monitor   Hyponatremia Presented with Na at 132. Takes Lasix as outpatient and presented appearing hypovolemic. Lactate improved after volume resuscitation. Will continue gentle  mIVF with Normal Saline. Sodium slightly lower at 127 this morning. If sodium decreases further, will check serum and urine osm as well as urine sodium, bearing in mind possibility of SIADH.  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  - Will continue to monitor   Hypertension BP ranging 125/80-181/94 while on home med Metoprol tartrate 25mg  BID and Imdur 30mg  daily. Will add back last home med Cozaar 100mg  daily. - Resume home Losartan 100mg  daily  Hypokalemia Likely secondary to poor PO intake (not Lasix as we are holding), as K decreased to 3.0 this AM.  - Replete as appropriate  - Will continue to monitor   Dyslipidemia LFTs are normal - Continue home Atorvastatin 40mg    CAD/CHF Last echocardiogram January 2024 with preserved EF unable to evaluate for diastolic dysfunction. No reports of chest pain, SOB, orthopnea, leg swelling, appearing clinically stable. - Continue home Imdur/Lopressor/Cozaar/ASA    Hypothyroidism - Continue Synthroid     DVT prophylaxis: SCDs Code Status: DNR Family Communication: Will ask pt   Consultants:  Heme/Onc  Procedures:  None  Antimicrobials:  Vanc, Cefepime, Diflucan initiated 11/17/22   Subjective: Patient seen and evaluated today with no new acute complaints or concerns. No acute concerns or events noted overnight. Pt notes that he has some mild sinus drainage and thinks he has seasonal allergies. Denies any cough, sputum production, chest pain, chest pressure, SOB or other concerns. He notes that he simply felt too weak to get in to bed when EMS was called. However, he usually ambulates at home during the day without assistance, and only uses a rolling walker at night to get to the bathroom.  Objective: Vitals:   11/17/22 1306 11/17/22 1837 11/17/22 2000 11/18/22 0414  BP: (!) (P) 180/71 (!) 178/72 (!) 176/70 (!) 173/72  Pulse: (P) 72 88 81 72  Resp:   18 16  Temp:  99.5 F (37.5 C) 98.9 F (37.2 C) 100 F (37.8 C)  TempSrc:  Oral Oral Oral  SpO2: (P) 99% 99% 97% 94%  Weight:      Height:         Intake/Output Summary (Last 24 hours) at 11/18/2022 1022 Last data filed at 11/18/2022 0500 Gross per 24 hour  Intake 240 ml  Output 1625 ml  Net -1385 ml   Filed Weights   11/17/22 0208  Weight: 59 kg    Examination:  General exam: Appears calm and comfortable  Respiratory system: Clear to auscultation. Respiratory effort normal on RA. Cardiovascular system: S1 & S2 heard, RRR.  Gastrointestinal system: Abdomen is soft, nontender, with positive bowel sounds Central nervous system: Alert and awake Extremities: No edema Skin: No significant lesions noted Psychiatry: Responsive affect.    Data Reviewed: I have personally reviewed following labs and imaging studies  CBC: Recent Labs  Lab 11/13/22 0814 11/17/22 0240 11/18/22 0514  WBC 4.5 8.4 6.0  NEUTROABS 2.4 7.0  --   HGB 8.0* 9.3* 8.6*  HCT 23.9* 28.1* 26.1*  MCV 113.3* 112.9* 113.5*  PLT 115* 44* 52*   Basic Metabolic Panel: Recent Labs  Lab 11/13/22 0814 11/17/22 0240 11/18/22 0514  NA 131* 132* 127*  K 3.7 3.6 3.0*  CL 103 99 100  CO2 23 22 21*  GLUCOSE 101* 126* 84  BUN 21 28* 15  CREATININE 0.79 1.09 0.96  CALCIUM 8.7* 8.9 7.9*  MG 2.1  --   --   PHOS 3.4  --   --    GFR: Estimated Creatinine Clearance: 41 mL/min (by C-G formula based on SCr of 0.96 mg/dL). Liver Function Tests: Recent Labs  Lab 11/13/22 0814 11/17/22 0240  AST 19 15  ALT 27 24  ALKPHOS 60 67  BILITOT 0.5 0.8  PROT 7.9 8.5*  ALBUMIN 2.8* 3.3*   No results for input(s): "LIPASE", "AMYLASE" in the last 168 hours. No results for input(s): "AMMONIA" in the last 168 hours. Coagulation Profile: Recent Labs  Lab 11/17/22 0240  INR 1.2   Cardiac Enzymes: Recent Labs  Lab 11/17/22 0520  CKTOTAL 266   BNP (last 3 results) No results for input(s): "PROBNP" in the last 8760 hours. HbA1C: No results for input(s): "HGBA1C" in the last 72 hours. CBG: No results for input(s): "GLUCAP" in the last 168 hours. Lipid  Profile: No results for input(s): "CHOL", "HDL", "LDLCALC", "TRIG", "CHOLHDL", "LDLDIRECT" in the last 72 hours. Thyroid Function Tests: No results for input(s): "TSH", "T4TOTAL", "FREET4", "T3FREE", "THYROIDAB" in the last 72 hours. Anemia Panel: No results for input(s): "VITAMINB12", "FOLATE", "FERRITIN", "TIBC", "IRON", "RETICCTPCT" in the last 72 hours. Sepsis Labs: Recent Labs  Lab 11/17/22 0240 11/17/22 0520 11/17/22 0858  PROCALCITON  --   --  0.16  LATICACIDVEN 3.1* 1.3  --     Recent Results (from the past 240 hour(s))  Culture, blood (Routine x 2)     Status: None (Preliminary result)   Collection Time: 11/17/22  2:41 AM   Specimen: Left Antecubital; Blood  Result Value Ref Range Status   Specimen Description LEFT ANTECUBITAL  Final   Special Requests   Final    BOTTLES DRAWN AEROBIC AND ANAEROBIC Blood Culture adequate volume   Culture   Final    NO GROWTH 1 DAY Performed at [facility], [address] [place], [place] [address]    Report Status PENDING  Incomplete  Resp panel by RT-PCR (RSV, Flu A&B, Covid) Anterior Nasal Swab     Status: None   Collection Time: 11/17/22  3:00 AM   Specimen: Anterior Nasal Swab  Result Value Ref Range Status   SARS Coronavirus 2 by RT PCR NEGATIVE NEGATIVE Final    Comment: (NOTE) SARS-CoV-2 target nucleic acids are NOT DETECTED.  The SARS-CoV-2 RNA is generally detectable in upper respiratory specimens during the acute phase of infection. The lowest concentration of SARS-CoV-2 viral copies this assay can detect is 138 copies/mL. A negative result does not preclude SARS-Cov-2 infection and should not be used as the sole basis for treatment or other patient management decisions. A negative result may occur with  improper specimen collection/handling, submission of specimen other than nasopharyngeal swab, presence of viral mutation(s) within the areas targeted by this assay, and inadequate number of viral copies(<138  copies/mL). A negative result must be combined with clinical observations, patient history, and epidemiological information. The expected result is Negative.  Fact Sheet for Patients:  BloggerCourse.com  Fact Sheet for Healthcare Providers:  SeriousBroker.it  This test is no t yet approved or cleared by the [place] FDA and  has been authorized for detection and/or diagnosis of SARS-CoV-2 by FDA under an Emergency Use Authorization (EUA). This EUA will remain  in effect (meaning this test can be used) for the duration of the COVID-19 declaration under Section 564(b)(1) of the Act, 21 U.S.C.section 360bbb-3(b)(1), unless the authorization is terminated  or revoked sooner.  Influenza A by PCR NEGATIVE NEGATIVE Final   Influenza B by PCR NEGATIVE NEGATIVE Final    Comment: (NOTE) The Xpert Xpress SARS-CoV-2/FLU/RSV plus assay is intended as an aid in the diagnosis of influenza from Nasopharyngeal swab specimens and should not be used as a sole basis for treatment. Nasal washings and aspirates are unacceptable for Xpert Xpress SARS-CoV-2/FLU/RSV testing.  Fact Sheet for Patients: BloggerCourse.com  Fact Sheet for Healthcare Providers: SeriousBroker.it  This test is not yet approved or cleared by the [place] FDA and has been authorized for detection and/or diagnosis of SARS-CoV-2 by FDA under an Emergency Use Authorization (EUA). This EUA will remain in effect (meaning this test can be used) for the duration of the COVID-19 declaration under Section 564(b)(1) of the Act, 21 U.S.C. section 360bbb-3(b)(1), unless the authorization is terminated or revoked.     Resp Syncytial Virus by PCR NEGATIVE NEGATIVE Final    Comment: (NOTE) Fact Sheet for Patients: BloggerCourse.com  Fact Sheet for Healthcare  Providers: SeriousBroker.it  This test is not yet approved or cleared by the [place] FDA and has been authorized for detection and/or diagnosis of SARS-CoV-2 by FDA under an Emergency Use Authorization (EUA). This EUA will remain in effect (meaning this test can be used) for the duration of the COVID-19 declaration under Section 564(b)(1) of the Act, 21 U.S.C. section 360bbb-3(b)(1), unless the authorization is terminated or revoked.  Performed at [facility], [address], [place], [place] [address]   Culture, blood (Routine x 2)     Status: None (Preliminary result)   Collection Time: 11/17/22  3:08 AM   Specimen: BLOOD RIGHT FOREARM  Result Value Ref Range Status   Specimen Description BLOOD RIGHT FOREARM  Final   Special Requests   Final    BOTTLES DRAWN AEROBIC AND ANAEROBIC Blood Culture adequate volume   Culture   Final    NO GROWTH 1 DAY Performed at [facility], [address], [place], [place] [address]    Report Status PENDING  Incomplete  SARS Coronavirus 2 by RT PCR (hospital order, performed in [facility] hospital lab) *cepheid single result test* Anterior Nasal Swab     Status: None   Collection Time: 11/17/22 10:04 AM   Specimen: Anterior Nasal Swab  Result Value Ref Range Status   SARS Coronavirus 2 by RT PCR NEGATIVE NEGATIVE Final    Comment: (NOTE) SARS-CoV-2 target nucleic acids are NOT DETECTED.  The SARS-CoV-2 RNA is generally detectable in upper and lower respiratory specimens during the acute phase of infection. The lowest concentration of SARS-CoV-2 viral copies this assay can detect is 250 copies / mL. A negative result does not preclude SARS-CoV-2 infection and should not be used as the sole basis for treatment or other patient management decisions.  A negative result may occur with improper specimen collection / handling, submission of specimen other than nasopharyngeal swab, presence of viral mutation(s) within  the areas targeted by this assay, and inadequate number of viral copies (<250 copies / mL). A negative result must be combined with clinical observations, patient history, and epidemiological information.  Fact Sheet for Patients:   RoadLapTop.co.za  Fact Sheet for Healthcare Providers: http://kim-miller.com/  This test is not yet approved or  cleared by the [place] FDA and has been authorized for detection and/or diagnosis of SARS-CoV-2 by FDA under an Emergency Use Authorization (EUA).  This EUA will remain in effect (meaning this test can be used) for the duration of the COVID-19  declaration under Section 564(b)(1) of the Act, 21 U.S.C. section 360bbb-3(b)(1), unless the authorization is terminated or revoked sooner.  Performed at [facility], [address], [place], [place] [address]   Respiratory (~20 pathogens) panel by PCR     Status: None   Collection Time: 11/17/22  6:40 PM   Specimen: Nasopharyngeal Swab; Respiratory  Result Value Ref Range Status   Adenovirus NOT DETECTED NOT DETECTED Final   Coronavirus 229E NOT DETECTED NOT DETECTED Final    Comment: (NOTE) The Coronavirus on the Respiratory Panel, DOES NOT test for the novel  Coronavirus (2019 nCoV)    Coronavirus HKU1 NOT DETECTED NOT DETECTED Final   Coronavirus NL63 NOT DETECTED NOT DETECTED Final   Coronavirus OC43 NOT DETECTED NOT DETECTED Final   Metapneumovirus NOT DETECTED NOT DETECTED Final   Rhinovirus / Enterovirus NOT DETECTED NOT DETECTED Final   Influenza A NOT DETECTED NOT DETECTED Final   Influenza B NOT DETECTED NOT DETECTED Final   Parainfluenza Virus 1 NOT DETECTED NOT DETECTED Final   Parainfluenza Virus 2 NOT DETECTED NOT DETECTED Final   Parainfluenza Virus 3 NOT DETECTED NOT DETECTED Final   Parainfluenza Virus 4 NOT DETECTED NOT DETECTED Final   Respiratory Syncytial Virus NOT DETECTED NOT DETECTED Final   Bordetella pertussis NOT  DETECTED NOT DETECTED Final   Bordetella Parapertussis NOT DETECTED NOT DETECTED Final   Chlamydophila pneumoniae NOT DETECTED NOT DETECTED Final   Mycoplasma pneumoniae NOT DETECTED NOT DETECTED Final    Comment: Performed at [facility], [address] [place], [place] [address]         Radiology Studies: CT CHEST WO CONTRAST  Result Date: 11/17/2022 CLINICAL DATA:  89 year old male with history of pleural mass. History of multiple myeloma. * Tracking Code: BO * EXAM: CT CHEST WITHOUT CONTRAST TECHNIQUE: Multidetector CT imaging of the chest was performed following the standard protocol without IV contrast. RADIATION DOSE REDUCTION: This exam was performed according to the departmental dose-optimization program which includes automated exposure control, adjustment of the mA and/or kV according to patient size and/or use of iterative reconstruction technique. COMPARISON:  CTA of the chest 02/06/2022. CT of the abdomen and pelvis 11/17/2022. FINDINGS: Cardiovascular: Heart size is borderline enlarged. There is no significant pericardial fluid, thickening or pericardial calcification. There is aortic atherosclerosis, as well as atherosclerosis of the great vessels of the mediastinum and the coronary arteries, including calcified atherosclerotic plaque in the left main, left anterior descending, left circumflex and right coronary arteries. Mild calcifications of the aortic valve. Left-sided single-lumen Port-A-Cath with tip terminating at the superior cavoatrial junction. Mediastinum/Nodes: No pathologically enlarged mediastinal or hilar lymph nodes. Please note that accurate exclusion of hilar adenopathy is limited on noncontrast CT scans. Several densely calcified mediastinal lymph nodes are incidentally noted. Esophagus is unremarkable in appearance. No axillary lymphadenopathy. Lungs/Pleura: Enlarging mass-like area of consolidation in the periphery of the left lung base, currently measuring  5.1 x 2.8 cm (recently 2.6 x 2.0 cm on earlier contemporaneously obtained CT of the abdomen and pelvis). Some surrounding ground-glass attenuation also noted posteriorly in the left lower lobe. Trace left pleural effusion lying dependently. Scattered areas of chronic post infectious or inflammatory scarring are again noted in the posterior aspect of the right lung, similar to the prior examination from 02/06/2022 where there are extensive areas of septal thickening, thickening of the peribronchovascular interstitium, cylindrical bronchiectasis, peripheral bronchiolectasis and nodular areas of scarring and architectural distortion. Upper Abdomen: See separate dictation for recently  performed CT of the abdomen and pelvis for full description of findings below the diaphragm. Musculoskeletal: There are no aggressive appearing lytic or blastic lesions noted in the visualized portions of the skeleton. IMPRESSION: 1. Rapid enlargement of mass-like area of apparent airspace consolidation in the left lower lobe. Findings do not suggest malignancy. Primary differential considerations include alveolar hemorrhage in the setting of acute pulmonary infarct, or rapidly progressive pneumonia. Further clinical evaluation is recommended. If there is clinical concern for pulmonary infarction, repeat PE protocol CT scan should be considered. 2. Trace left pleural effusion lying dependently. 3. 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 4. Aortic atherosclerosis, in addition to left main and three-vessel coronary artery disease. 5. There are calcifications of the aortic valve. Echocardiographic correlation for evaluation of potential valvular dysfunction may be warranted if clinically indicated. Aortic Atherosclerosis (ICD10-I70.0). Electronically Signed   By: Trudie Reed M.D.   On: 11/17/2022 10:14   CT ABDOMEN PELVIS W CONTRAST  Result Date: 11/17/2022 CLINICAL DATA:   89 year old male history of weakness. Possible sepsis. Currently undergoing chemotherapy. History of multiple myeloma. * Tracking Code: BO * EXAM: CT ABDOMEN AND PELVIS WITH CONTRAST TECHNIQUE: Multidetector CT imaging of the abdomen and pelvis was performed using the standard protocol following bolus administration of intravenous contrast. RADIATION DOSE REDUCTION: This exam was performed according to the departmental dose-optimization program which includes automated exposure control, adjustment of the mA and/or kV according to patient size and/or use of iterative reconstruction technique. CONTRAST:  80mL OMNIPAQUE IOHEXOL 300 MG/ML  SOLN COMPARISON:  CT of the chest, abdomen and pelvis 01/16/2022. FINDINGS: Lower chest: In the periphery of the left lower lobe (axial image 9 of series 4) there is a new mass-like area measuring 2.6 x 2.0 cm. Some dependent ground-glass attenuation and focal volume loss and architectural distortion is noted in the surrounding lung parenchyma in the base of the left lower lobe. Atherosclerotic calcifications are noted in the descending thoracic aorta as well as the left main, left anterior descending, left circumflex and right coronary arteries. Hepatobiliary: Low-attenuation lesion which measures 3.7 cm in the left lobe between segments 2 and 3 (axial image 22 of series 2), compatible with a simple cyst (no imaging follow-up recommended). Other 12 mm intermediate attenuation (34 HU) lesion between segment 4A and 8 (axial image 17 of series 2), incompletely characterize, but stable compared to the prior study, presumably a benign lesion such as a mildly proteinaceous cyst. No other aggressive appearing hepatic lesions are noted. No intra or extrahepatic biliary ductal dilatation. Gallbladder is unremarkable in appearance. Pancreas: No pancreatic mass. No pancreatic ductal dilatation. No pancreatic or peripancreatic fluid collections or inflammatory changes. Spleen: Unremarkable.  Adrenals/Urinary Tract: Nonobstructive calculi are noted within the collecting systems of both kidneys measuring up to 4 mm in the interpolar region of the right kidney. Mild cortical thinning in the interpolar region of the left kidney, presumably chronic post infectious or inflammatory scarring. Subcentimeter low-attenuation lesions in both kidneys, too small to definitively characterize, but statistically likely tiny cysts (no imaging follow-up recommended). No aggressive appearing renal lesions are noted. No hydroureteronephrosis. Urinary bladder is unremarkable in appearance. Bilateral adrenal glands are normal in appearance. Stomach/Bowel: The appearance of the stomach is normal. No pathologic dilatation of small bowel or colon. Numerous colonic diverticula are noted, without surrounding inflammatory changes to indicate an acute diverticulitis at this time. Normal appendix. Vascular/Lymphatic: Atherosclerotic calcifications throughout the abdominal aorta and pelvic vasculature, without  evidence of aneurysm or dissection. No lymphadenopathy noted in the abdomen or pelvis. Reproductive: Prostate gland and seminal vesicles are unremarkable in appearance. Other: Small right inguinal hernia containing only fat. No significant volume of ascites. No pneumoperitoneum. Musculoskeletal: There are no aggressive appearing lytic or blastic lesions noted in the visualized portions of the skeleton. IMPRESSION: 1. No acute findings are noted in the abdomen or pelvis to account for the patient's symptoms. 2. However, there is a new mass-like opacity in the base of the left lower lobe, with surrounding ground-glass attenuation in the adjacent left lower lobe. This is potentially of infectious or inflammatory etiology, however, underlying neoplasm is difficult to entirely exclude. Short-term follow-up noncontrast chest CT is suggested in 1 month after trial of antimicrobial therapy to ensure regression of this finding. 3.  Colonic diverticulosis without evidence of acute diverticulitis at this time. 4. Nonobstructive calculi in the collecting systems of both kidneys measuring up to 4 mm in the interpolar region of the right kidney. 5. Right inguinal hernia containing only fat. No evidence of associated bowel incarceration or obstruction at this time. 6. Aortic atherosclerosis, in addition to left main and three-vessel coronary artery disease. 7. Additional incidental findings, as above. Electronically Signed   By: Trudie Reed M.D.   On: 11/17/2022 05:18   DG Chest Portable 1 View  Result Date: 11/17/2022 CLINICAL DATA:  Possible sepsis EXAM: PORTABLE CHEST 1 VIEW COMPARISON:  10/07/2022 FINDINGS: Cardiac shadow is stable. Aortic calcifications are noted. Left chest wall port is again seen. Lungs are well aerated bilaterally. No focal infiltrate or sizable effusion is seen. Chronic scarring is again noted bilaterally. No bony abnormality is seen. IMPRESSION: Scarring without acute abnormality. Electronically Signed   By: Alcide Clever M.D.   On: 11/17/2022 03:34        Scheduled Meds:  acyclovir  400 mg Oral BID   ascorbic acid  500 mg Oral QPM   aspirin EC  81 mg Oral Q breakfast   atorvastatin  40 mg Oral Daily   brimonidine  1 drop Left Eye BID   Chlorhexidine Gluconate Cloth  6 each Topical Daily   cyanocobalamin  1,000 mcg Oral Daily   dorzolamide-timolol  1 drop Left Eye BID   feeding supplement  237 mL Oral TID BM   isosorbide mononitrate  30 mg Oral Daily   levothyroxine  75 mcg Oral QAC breakfast   metoprolol tartrate  25 mg Oral BID   multivitamin-lutein  1 capsule Oral BID   Netarsudil-Latanoprost  1 drop Left Eye QHS   pantoprazole  40 mg Oral Daily   pneumococcal 20-valent conjugate vaccine  0.5 mL Intramuscular Tomorrow-1000   sodium chloride flush  3 mL Intravenous Q12H   sodium chloride flush  3 mL Intravenous Q12H   vitamin E  200 Units Oral QPM   Continuous Infusions:  sodium  chloride     sodium chloride Stopped (11/17/22 1231)   ceFEPime (MAXIPIME) IV 2 g (11/18/22 0835)   fluconazole (DIFLUCAN) IV Stopped (11/17/22 1232)   vancomycin       LOS: 1 day    Time spent: 35 minutes  Signed,  Marcelline Mates, MS4 Working with Dr. Maurilio Lovely

## 2022-11-19 LAB — CBC
HCT: 27.5 % — ABNORMAL LOW (ref 39.0–52.0)
HCT: 28.4 % — ABNORMAL LOW (ref 39.0–52.0)
Hemoglobin: 9.2 g/dL — ABNORMAL LOW (ref 13.0–17.0)
Hemoglobin: 9.4 g/dL — ABNORMAL LOW (ref 13.0–17.0)
MCH: 37.6 pg — ABNORMAL HIGH (ref 26.0–34.0)
MCH: 37.3 pg — ABNORMAL HIGH (ref 26.0–34.0)
MCHC: 33.5 g/dL (ref 30.0–36.0)
MCHC: 33.1 g/dL (ref 30.0–36.0)
MCV: 112.2 fL — ABNORMAL HIGH (ref 80.0–100.0)
MCV: 112.7 fL — ABNORMAL HIGH (ref 80.0–100.0)
Platelets: 74 10*3/uL — ABNORMAL LOW (ref 150–400)
Platelets: 79 10*3/uL — ABNORMAL LOW (ref 150–400)
RBC: 2.45 MIL/uL — ABNORMAL LOW (ref 4.22–5.81)
RBC: 2.52 MIL/uL — ABNORMAL LOW (ref 4.22–5.81)
WBC: 5 10*3/uL (ref 4.0–10.5)
WBC: 5.6 10*3/uL (ref 4.0–10.5)
nRBC: 0 % (ref 0.0–0.2)
nRBC: 0 % (ref 0.0–0.2)

## 2022-11-19 LAB — BASIC METABOLIC PANEL
Anion gap: 6 (ref 5–15)
Anion gap: 6 (ref 5–15)
BUN: 17 mg/dL (ref 8–23)
BUN: 15 mg/dL (ref 8–23)
CO2: 23 mmol/L (ref 22–32)
CO2: 21 mmol/L — ABNORMAL LOW (ref 22–32)
Calcium: 8.6 mg/dL — ABNORMAL LOW (ref 8.9–10.3)
Calcium: 8.1 mg/dL — ABNORMAL LOW (ref 8.9–10.3)
Chloride: 103 mmol/L (ref 98–111)
Chloride: 98 mmol/L (ref 98–111)
Creatinine, Ser: 0.83 mg/dL (ref 0.61–1.24)
Creatinine, Ser: 0.78 mg/dL (ref 0.61–1.24)
GFR, Estimated: >60 mL/min (ref >60)
GFR, Estimated: >60 mL/min (ref >60)
Glucose, Bld: 98 mg/dL (ref 70–99)
Glucose, Bld: 114 mg/dL — ABNORMAL HIGH (ref 70–99)
Potassium: 3.8 mmol/L (ref 3.5–5.1)
Potassium: 3.5 mmol/L (ref 3.5–5.1)
Sodium: 132 mmol/L — ABNORMAL LOW (ref 135–145)
Sodium: 125 mmol/L — ABNORMAL LOW (ref 135–145)

## 2022-11-19 LAB — MAGNESIUM
Magnesium: 2.3 mg/dL (ref 1.7–2.4)
Magnesium: 2.1 mg/dL (ref 1.7–2.4)

## 2022-11-19 MED ORDER — HYDRALAZINE HCL 20 MG/ML IJ SOLN
10.0000 mg | INTRAMUSCULAR | Status: DC | PRN
  Administered 2022-11-19 – 2022-11-20 (×2): 10 mg via INTRAVENOUS
  Filled 2022-11-19 (×2): qty 1

## 2022-11-19 MED ORDER — ISOSORBIDE MONONITRATE ER 60 MG PO TB24
60.0000 mg | ORAL_TABLET | Freq: Every day | ORAL | Status: DC
  Administered 2022-11-20: 60 mg via ORAL
  Filled 2022-11-19: qty 1

## 2022-11-19 NOTE — Progress Notes (Signed)
Patient slept on and off this shift, no complaints of pain. Took medications one at a time with no issues. Patient ambulated to restroom with assistance. Continued to monitor.

## 2022-11-19 NOTE — TOC Progression Note (Signed)
Transition of Care [facility]) - Progression Note    Patient Details  Name: LARUE LIGHTNER MRN: [id number] Date of Birth: 12-Nov-1928  [facility]) CM/SW Contact  Leitha Bleak, RN Phone Number: 11/19/2022, 11:34 AM  Clinical Narrative:   PT is recommending HHPT.  TOC has set up with [facility] in the past. Morrie Sheldon is checking to see if patient is active. THey will accept. MD aware to order HHPT.    Barriers to Discharge: Continued Medical Work up  Expected Discharge Plan and Services      Living arrangements for the past 2 months: Skilled Nursing Facility          HH Arranged: PT   Date HH Agency Contacted: 11/19/22 Time HH Agency Contacted: 1131 Representative spoke with at [facility] Agency: Morrie Sheldon   Social Determinants of Health (SDOH) Interventions SDOH Screenings   Food Insecurity: No Food Insecurity (11/17/2022)  Housing: Low Risk  (11/17/2022)  Transportation Needs: No Transportation Needs (11/17/2022)  Utilities: Not At Risk (11/17/2022)  Social Connections: Unknown (09/18/2021)   Received from [facility]  Stress: No Stress Concern Present (10/16/2021)   Received from [facility]  Tobacco Use: Medium Risk (11/17/2022)    Readmission Risk Interventions    11/18/2022    3:29 PM 07/30/2021   10:16 AM  Readmission Risk Prevention Plan  Transportation Screening Complete Complete  PCP or Specialist Appt within 3-5 Days Not Complete   HRI or Home Care Consult Complete Complete  Social Work Consult for Recovery Care Planning/Counseling Complete Complete  Palliative Care Screening Not Applicable Not Applicable  Medication Review Oceanographer) Complete Complete

## 2022-11-19 NOTE — Evaluation (Signed)
Physical Therapy Evaluation Patient Details Name: Dale [name] MRN: [id number] DOB: 01/06/1929 Today's Date: 11/19/2022  History of Present Illness  89 year old with history of multiple myeloma currently undergoing chemoRx with Dr. Ellin Saba, history of chronic diastolic dysfunction CHF, HTN, HLD and CAD as well as history of pulmonary sarcoid presenting with generalized weakness malaise and fatigue and found to have worsening thrombocytopenia and persistent anemia   Clinical Impression  Patient demonstrates labored movement with verbal/tactile cueing for propping up onto elbows to hands with fair/good carryover, has to lean on nearby objects for support when taking steps without AD, required use of RW for safety and able to ambulate in room/hallway without loss of balance. Patient tolerated sitting up in chair after therapy - nursing staff notified.  Patient will benefit from continued skilled physical therapy in hospital and recommended venue below to increase strength, balance, endurance for safe ADLs and gait.          Assistance Recommended at Discharge Set up Supervision/Assistance  If plan is discharge home, recommend the following:  Can travel by private vehicle  A little help with walking and/or transfers;A little help with bathing/dressing/bathroom;Help with stairs or ramp for entrance;Assistance with cooking/housework        Equipment Recommendations None recommended by PT  Recommendations for Other Services       Functional Status Assessment Patient has had a recent decline in their functional status and demonstrates the ability to make significant improvements in function in a reasonable and predictable amount of time.     Precautions / Restrictions Precautions Precautions: Fall Restrictions Weight Bearing Restrictions: No      Mobility  Bed Mobility Overal bed mobility: Needs Assistance Bed Mobility: Supine to Sit     Supine to sit: Min guard, Min assist      General bed mobility comments: fair/good return for propping up onto elbows to hands with verbal cueing and Min/mod assist    Transfers Overall transfer level: Needs assistance Equipment used: Rolling walker (2 wheels), None Transfers: Sit to/from Stand, Bed to chair/wheelchair/BSC Sit to Stand: Min guard   Step pivot transfers: Min guard       General transfer comment: had to lean on armrest of chair for support during transfer without AD, safer using RW    Ambulation/Gait Ambulation/Gait assistance: Min guard Gait Distance (Feet): 75 Feet Assistive device: Rolling walker (2 wheels) Gait Pattern/deviations: Decreased step length - right, Decreased step length - left, Decreased stride length Gait velocity: decreased     General Gait Details: slow slightly labored cadence without loss of balance, limited mostly due to c/o fatigue  Stairs            Wheelchair Mobility     Tilt Bed    Modified Rankin (Stroke Patients Only)       Balance Overall balance assessment: Needs assistance Sitting-balance support: Feet supported, No upper extremity supported Sitting balance-Leahy Scale: Fair Sitting balance - Comments: fair/good seated at EOB   Standing balance support: During functional activity, No upper extremity supported Standing balance-Leahy Scale: Poor Standing balance comment: fair/poor without AD, fair/good using RW                             Pertinent Vitals/Pain Pain Assessment Pain Assessment: No/denies pain    Home Living Family/patient expects to be discharged to:: Private residence Living Arrangements: Spouse/significant other Available Help at Discharge: Family;Available PRN/intermittently Type of Home: House Home Access: Stairs  to enter;Ramped entrance Entrance Stairs-Rails: Right;Left;Can reach both Entrance Stairs-Number of Steps: 3   Home Layout: One level Home Equipment: Agricultural consultant (2 wheels) Additional Comments:  Home assist via person who comes 5 days a week for 3 hours.    Prior Function Prior Level of Function : Needs assist       Physical Assist : Mobility (physical);ADLs (physical) Mobility (physical): Bed mobility;Transfers;Gait;Stairs   Mobility Comments: household and short distanced community ambulator using RW PRN. Pt drives. ADLs Comments: Independent ADL; assist IADL via person who comes 3 hours a day 5 days a week.     Hand Dominance   Dominant Hand: Right    Extremity/Trunk Assessment   Upper Extremity Assessment Upper Extremity Assessment: Overall WFL for tasks assessed    Lower Extremity Assessment Lower Extremity Assessment: Generalized weakness    Cervical / Trunk Assessment Cervical / Trunk Assessment: Kyphotic  Communication   Communication: HOH;No difficulties  Cognition Arousal/Alertness: Awake/alert Behavior During Therapy: WFL for tasks assessed/performed Overall Cognitive Status: Within Functional Limits for tasks assessed                                          General Comments      Exercises     Assessment/Plan    PT Assessment Patient needs continued PT services  PT Problem List Decreased strength;Decreased activity tolerance;Decreased balance;Decreased mobility       PT Treatment Interventions DME instruction;Gait training;Stair training;Functional mobility training;Therapeutic activities;Therapeutic exercise;Patient/family education;Balance training    PT Goals (Current goals can be found in the Care Plan section)  Acute Rehab PT Goals Patient Stated Goal: return home with family to assist PT Goal Formulation: With patient Time For Goal Achievement: 11/22/22 Potential to Achieve Goals: Good    Frequency Min 3X/week     Co-evaluation               AM-PAC PT "6 Clicks" Mobility  Outcome Measure Help needed turning from your back to your side while in a flat bed without using bedrails?: None Help needed  moving from lying on your back to sitting on the side of a flat bed without using bedrails?: A Little Help needed moving to and from a bed to a chair (including a wheelchair)?: A Little Help needed standing up from a chair using your arms (e.g., wheelchair or bedside chair)?: A Little Help needed to walk in hospital room?: A Little Help needed climbing 3-5 steps with a railing? : A Little 6 Click Score: 19    End of Session   Activity Tolerance: Patient tolerated treatment well;Patient limited by fatigue Patient left: in chair;with call bell/phone within reach Nurse Communication: Mobility status PT Visit Diagnosis: Unsteadiness on feet (R26.81);Other abnormalities of gait and mobility (R26.89);Muscle weakness (generalized) (M62.81)    Time: 4401-0272 PT Time Calculation (min) (ACUTE ONLY): 14 min   Charges:   PT Evaluation $PT Eval Low Complexity: 1 Low PT Treatments $Therapeutic Activity: 8-22 mins PT General Charges $$ ACUTE PT VISIT: 1 Visit         1:50 PM, 11/19/22 Ocie Bob, MPT Physical Therapist with [facility] [phone] office [phone] mobile phone

## 2022-11-19 NOTE — Progress Notes (Signed)
Pt ambulated to bathroom with walker, 1 assist. Had bowel movement. Repositioned back in bed call light within reach.

## 2022-11-19 NOTE — Plan of Care (Signed)
  Problem: Acute Rehab PT Goals(only PT should resolve) Goal: Pt Will Go Supine/Side To Sit Outcome: Progressing Flowsheets (Taken 11/19/2022 1353) Pt will go Supine/Side to Sit: with modified independence Goal: Patient Will Transfer Sit To/From Stand Outcome: Progressing Flowsheets (Taken 11/19/2022 1353) Patient will transfer sit to/from stand: with modified independence Goal: Pt Will Transfer Bed To Chair/Chair To Bed Outcome: Progressing Flowsheets (Taken 11/19/2022 1353) Pt will Transfer Bed to Chair/Chair to Bed: with modified independence Goal: Pt Will Ambulate Outcome: Progressing Flowsheets (Taken 11/19/2022 1353) Pt will Ambulate:  100 feet  with modified independence  with rolling walker   1:53 PM, 11/19/22 Ocie Bob, MPT Physical Therapist with [facility] [phone] office [phone] mobile phone

## 2022-11-19 NOTE — Progress Notes (Signed)
PROGRESS NOTE    Dale [name]  GNF:[id number] DOB: 11/23/1928 DOA: 11/17/2022 PCP: Pcp, No   Brief Narrative:  Dale [name] is a 89 y.o. 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 Ellin Saba, who presented via EMS with significant weakness  Dale [name]' wife found him kneeling on the floor by the bed.  Patient states he has been trying to get into the bed but the bed was too tall.  In discussion with the patient he developed abrupt onset of significant weakness wet cough with white phlegm generalized weakness.  Denies shortness of breath.  He was also having some soreness in the left lateral rib cage that is not reproducible with palpation.   Upon presentation to the ED he was febrile with a rectal temperature of 103.2 (quickly normalized), WBCs 8.4k, room air sats between 93- 97%, lactic acid was elevated at 3.1 (improved to 1.3 s/p 1L bolus IVF), CRP elevated at 3.5, Procalcitonin at 0.16, D-dimer at 1.15. Plain chest film showed no acute abnormality.  7/14 CT A/P revealed a new masslike opacity in the base of the left lower lobe with surrounding groundglass attenuation in the adjacent left lower lobe likely infectious or inflammatory in etiology but neoplasm could not be entirely excluded.  Subsequent 7/14 CT Chest revealed "rapid enlargement of mass like are of apparent airspace consolidation in LLL." He was started on Cefepime, Vancomycin and Diflucan on 7/14. On 7/15, a BLE Venous doppler was negative and pt fevered at night to 100.4. On 7/15, pt is stable on RA without tachypnea, tachycardia, chest pain, or productive cough. Remains hypertensive on home meds so will adjust further.     Assessment & Plan:   Principal Problem:   Fever Active Problems:   Pneumonia of left lower lobe due to infectious organism  Assessment and  Plan: SIRS/suspected secondary to pulmonary infection On presentation to the ED Dale Marullo was febrile to 103.2 (quickly normalized), tachypneic with normal sats on RA, lactic acid was elevated at 3.1 (improved to 1.3 s/p 1L bolus IVF), CRP elevated at 3.5, Procalcitonin at 0.16, D-dimer at 1.15. 7/14 CXR was wnl. 7/14 CT A/P revealed a new masslike opacity in the base of the left lower lobe with surrounding groundglass attenuation in the adjacent LLL likely infectious or inflammatory in etiology but neoplasm could not be entirely excluded. 5 hour subsequent 7/14 CT Chest revealed "rapid enlargement of mass like are of apparent airspace consolidation in LLL." He was started on Cefepime, Vancomycin and Diflucan on 7/14. 7/15 with negative results on Resp path panel, Urine antigen Strep pneumo. No hemoptysis, remains on RA, denies cough, SOB, and CP. Clinically well appearing, but did fever last night to 100.4 and will continue IV Abx today given immunocompromised status from active treatment of multiple myeloma. Due to concerns over possible pulmonary infarct obtained BLE Doppler which was negative. - Discontinue Vanc, pt confirmed MRSA negative - Continue Cefepime, Diflucan today 7/16  Hypertension BP ranging 126/78-185/83 while on home meds Metoprol tartrate 25mg  BID, Imdur 30mg  daily, and Losartan 100mg  daily. His outpatient Cardiology note from 09/20/22 recommended Imdur 60mg , not sure when or why this was decreased to 30mg  over the last two months. Given age of 89 years old, pt is probably physiologically accomodated to hypertension, and would favor gently decreasing MAP, with goal SBP<160.   - Will increase Imdur from 30  to 45mg  - Resume home Losartan 100mg  daily    Progressive thrombocytopenia/multiple myeloma on chemotherapy Presented with Plt at 44k on 7/14. Patient is on Bortezomib, Darzales and Decadron, s/p Cycle 9 during 7/3-7/10. Platelets had been trending downward since 6/26 normal at 156k.   Continuing to improve from 52k to 74k today. No signs or symptoms of blood loss.. Anticipate patient will need to hold his Velcade and his Darzalex until these lab values improve.  - Heme/Onc consulted and appreciate recs - Not a candidate for pharmacological DVT prophylaxis - Continue prophylactic acyclovir - Continue to monitor   Hyponatremia Presented with Na at 132. Takes Lasix as outpatient and presented appearing hypovolemic. Lactate improved after volume resuscitation. Will continue gentle mIVF with Normal Saline. Sodium improved from 127 to 132 this morning alongside increased PO intake. 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 K at 3.8 on 7/16.  - Replete as appropriate  - Will continue to monitor   Dyslipidemia LFTs are normal - Continue home Atorvastatin 40mg    CAD/CHF Last echocardiogram January 2024 with preserved EF unable to evaluate for diastolic dysfunction. No reports of chest pain, SOB, orthopnea, leg swelling, appearing clinically stable. - Continue Imdur/Lopressor/Cozaar/ASA    Hypothyroidism - Continue Synthroid     DVT prophylaxis: SCDs Code Status: DNR Family Communication: Will ask pt   Consultants:  Heme/Onc  Procedures:  None  Antimicrobials:  Vanc (7/14-7/15). Cefepime and Diflucan initiated 11/17/22 and continuing.   Subjective: Patient seen and evaluated today with no new acute complaints or concerns. No acute concerns or events noted overnight. Pt notes that he has some drainage in the mornings which resolves after gargling water and spitting. Denies any cough, sputum production, chest pain, chest  pressure, SOB or other concerns. Notes he is overall feeling better this morning.  Objective: Vitals:   11/18/22 0414 11/18/22 1203 11/18/22 2113 11/19/22 0431  BP: (!) 173/72 (!) 126/28 (!) 173/76 (!) 185/83  Pulse: 72 61 69 62  Resp: 16 19 17 18   Temp: 100 F (37.8 C) 98.5 F (36.9 C) (!) 100.4 F (38 C) 98 F (36.7 C)  TempSrc: Oral Oral    SpO2: 94% 99% 100% 99%  Weight:      Height:        Intake/Output Summary (Last 24 hours) at 11/19/2022 0942 Last data filed at 11/19/2022 0500 Gross per 24 hour  Intake 2068.61 ml  Output 1500 ml  Net 568.61 ml   Filed Weights   11/17/22 0208  Weight: 59 kg    Examination:  General exam: Appears calm and comfortable   Respiratory system: Clear to auscultation. Respiratory effort normal on RA. Cardiovascular system: S1 & S2 heard, RRR.  Gastrointestinal system: Abdomen is soft, nontender, with positive bowel sounds Central nervous system: Alert and awake Extremities: No edema Skin: No significant lesions noted Psychiatry: Responsive affect.    Data Reviewed: I have personally reviewed following labs and imaging studies  CBC: Recent Labs  Lab 11/13/22 0814 11/17/22 0240 11/18/22 0514 11/19/22 0421  WBC 4.5 8.4 6.0 5.0  NEUTROABS 2.4 7.0  --   --   HGB 8.0* 9.3*  8.6* 9.2*  HCT 23.9* 28.1* 26.1* 27.5*  MCV 113.3* 112.9* 113.5* 112.2*  PLT 115* 44* 52* 74*   Basic Metabolic Panel: Recent Labs  Lab 11/13/22 0814 11/17/22 0240 11/18/22 0514 11/19/22 0421  NA 131* 132* 127* 132*  K 3.7 3.6 3.0* 3.8  CL 103 99 100 103  CO2 23 22 21* 23  GLUCOSE 101* 126* 84 98  BUN 21 28* 15 17  CREATININE 0.79 1.09 0.96 0.83  CALCIUM 8.7* 8.9 7.9* 8.6*  MG 2.1  --   --  2.3  PHOS 3.4  --   --   --    GFR: Estimated Creatinine Clearance: 47.4 mL/min (by C-G formula based on SCr of 0.83 mg/dL). Liver Function Tests: Recent Labs  Lab 11/13/22 0814 11/17/22 0240  AST 19 15  ALT 27 24  ALKPHOS 60 67  BILITOT 0.5 0.8   PROT 7.9 8.5*  ALBUMIN 2.8* 3.3*   No results for input(s): "LIPASE", "AMYLASE" in the last 168 hours. No results for input(s): "AMMONIA" in the last 168 hours. Coagulation Profile: Recent Labs  Lab 11/17/22 0240  INR 1.2   Cardiac Enzymes: Recent Labs  Lab 11/17/22 0520  CKTOTAL 266   BNP (last 3 results) No results for input(s): "PROBNP" in the last 8760 hours. HbA1C: No results for input(s): "HGBA1C" in the last 72 hours. CBG: No results for input(s): "GLUCAP" in the last 168 hours. Lipid Profile: No results for input(s): "CHOL", "HDL", "LDLCALC", "TRIG", "CHOLHDL", "LDLDIRECT" in the last 72 hours. Thyroid Function Tests: No results for input(s): "TSH", "T4TOTAL", "FREET4", "T3FREE", "THYROIDAB" in the last 72 hours. Anemia Panel: No results for input(s): "VITAMINB12", "FOLATE", "FERRITIN", "TIBC", "IRON", "RETICCTPCT" in the last 72 hours. Sepsis Labs: Recent Labs  Lab 11/17/22 0240 11/17/22 0520 11/17/22 0858  PROCALCITON  --   --  0.16  LATICACIDVEN 3.1* 1.3  --     Recent Results (from the past 240 hour(s))  Culture, blood (Routine x 2)     Status: None (Preliminary result)   Collection Time: 11/17/22  2:41 AM   Specimen: Left Antecubital; Blood  Result Value Ref Range Status   Specimen Description LEFT ANTECUBITAL  Final   Special Requests   Final    BOTTLES DRAWN AEROBIC AND ANAEROBIC Blood Culture adequate volume   Culture   Final    NO GROWTH 2 DAYS Performed at [facility], [address] [place], [place] [address]    Report Status PENDING  Incomplete  Resp panel by RT-PCR (RSV, Flu A&B, Covid) Anterior Nasal Swab     Status: None   Collection Time: 11/17/22  3:00 AM   Specimen: Anterior Nasal Swab  Result Value Ref Range Status   SARS Coronavirus 2 by RT PCR NEGATIVE NEGATIVE Final    Comment: (NOTE) SARS-CoV-2 target nucleic acids are NOT DETECTED.  The SARS-CoV-2 RNA is generally detectable in upper respiratory specimens during the acute  phase of infection. The lowest concentration of SARS-CoV-2 viral copies this assay can detect is 138 copies/mL. A negative result does not preclude SARS-Cov-2 infection and should not be used as the sole basis for treatment or other patient management decisions. A negative result may occur with  improper specimen collection/handling, submission of specimen other than nasopharyngeal swab, presence of viral mutation(s) within the areas targeted by this assay, and inadequate number of viral copies(<138 copies/mL). A negative result must be combined with clinical observations, patient history, and epidemiological information. The expected result is Negative.  Fact  Sheet for Patients:  BloggerCourse.com  Fact Sheet for Healthcare Providers:  SeriousBroker.it  This test is no t yet approved or cleared by the [place] FDA and  has been authorized for detection and/or diagnosis of SARS-CoV-2 by FDA under an Emergency Use Authorization (EUA). This EUA will remain  in effect (meaning this test can be used) for the duration of the COVID-19 declaration under Section 564(b)(1) of the Act, 21 U.S.C.section 360bbb-3(b)(1), unless the authorization is terminated  or revoked sooner.       Influenza A by PCR NEGATIVE NEGATIVE Final   Influenza B by PCR NEGATIVE NEGATIVE Final    Comment: (NOTE) The Xpert Xpress SARS-CoV-2/FLU/RSV plus assay is intended as an aid in the diagnosis of influenza from Nasopharyngeal swab specimens and should not be used as a sole basis for treatment. Nasal washings and aspirates are unacceptable for Xpert Xpress SARS-CoV-2/FLU/RSV testing.  Fact Sheet for Patients: BloggerCourse.com  Fact Sheet for Healthcare Providers: SeriousBroker.it  This test is not yet approved or cleared by the [place] FDA and has been authorized for detection and/or diagnosis of  SARS-CoV-2 by FDA under an Emergency Use Authorization (EUA). This EUA will remain in effect (meaning this test can be used) for the duration of the COVID-19 declaration under Section 564(b)(1) of the Act, 21 U.S.C. section 360bbb-3(b)(1), unless the authorization is terminated or revoked.     Resp Syncytial Virus by PCR NEGATIVE NEGATIVE Final    Comment: (NOTE) Fact Sheet for Patients: BloggerCourse.com  Fact Sheet for Healthcare Providers: SeriousBroker.it  This test is not yet approved or cleared by the [place] FDA and has been authorized for detection and/or diagnosis of SARS-CoV-2 by FDA under an Emergency Use Authorization (EUA). This EUA will remain in effect (meaning this test can be used) for the duration of the COVID-19 declaration under Section 564(b)(1) of the Act, 21 U.S.C. section 360bbb-3(b)(1), unless the authorization is terminated or revoked.  Performed at [facility], [address], [place], [place] [address]   Culture, blood (Routine x 2)     Status: None (Preliminary result)   Collection Time: 11/17/22  3:08 AM   Specimen: BLOOD RIGHT FOREARM  Result Value Ref Range Status   Specimen Description BLOOD RIGHT FOREARM  Final   Special Requests   Final    BOTTLES DRAWN AEROBIC AND ANAEROBIC Blood Culture adequate volume   Culture   Final    NO GROWTH 2 DAYS Performed at [facility], [address], [place], [place] [address]    Report Status PENDING  Incomplete  SARS Coronavirus 2 by RT PCR (hospital order, performed in [facility] hospital lab) *cepheid single result test* Anterior Nasal Swab     Status: None   Collection Time: 11/17/22 10:04 AM   Specimen: Anterior Nasal Swab  Result Value Ref Range Status   SARS Coronavirus 2 by RT PCR NEGATIVE NEGATIVE Final    Comment: (NOTE) SARS-CoV-2 target nucleic acids are NOT DETECTED.  The SARS-CoV-2 RNA is generally detectable in upper and  lower respiratory specimens during the acute phase of infection. The lowest concentration of SARS-CoV-2 viral copies this assay can detect is 250 copies / mL. A negative result does not preclude SARS-CoV-2 infection and should not be used as the sole basis for treatment or other patient management decisions.  A negative result may occur with improper specimen collection / handling, submission of specimen other than nasopharyngeal swab, presence of viral mutation(s) within the areas targeted by this assay,  and inadequate number of viral copies (<250 copies / mL). A negative result must be combined with clinical observations, patient history, and epidemiological information.  Fact Sheet for Patients:   RoadLapTop.co.za  Fact Sheet for Healthcare Providers: http://kim-miller.com/  This test is not yet approved or  cleared by the [place] FDA and has been authorized for detection and/or diagnosis of SARS-CoV-2 by FDA under an Emergency Use Authorization (EUA).  This EUA will remain in effect (meaning this test can be used) for the duration of the COVID-19 declaration under Section 564(b)(1) of the Act, 21 U.S.C. section 360bbb-3(b)(1), unless the authorization is terminated or revoked sooner.  Performed at [facility], [address], [place], [place] [address]   Respiratory (~20 pathogens) panel by PCR     Status: None   Collection Time: 11/17/22  6:40 PM   Specimen: Nasopharyngeal Swab; Respiratory  Result Value Ref Range Status   Adenovirus NOT DETECTED NOT DETECTED Final   Coronavirus 229E NOT DETECTED NOT DETECTED Final    Comment: (NOTE) The Coronavirus on the Respiratory Panel, DOES NOT test for the novel  Coronavirus (2019 nCoV)    Coronavirus HKU1 NOT DETECTED NOT DETECTED Final   Coronavirus NL63 NOT DETECTED NOT DETECTED Final   Coronavirus OC43 NOT DETECTED NOT DETECTED Final   Metapneumovirus NOT DETECTED NOT DETECTED  Final   Rhinovirus / Enterovirus NOT DETECTED NOT DETECTED Final   Influenza A NOT DETECTED NOT DETECTED Final   Influenza B NOT DETECTED NOT DETECTED Final   Parainfluenza Virus 1 NOT DETECTED NOT DETECTED Final   Parainfluenza Virus 2 NOT DETECTED NOT DETECTED Final   Parainfluenza Virus 3 NOT DETECTED NOT DETECTED Final   Parainfluenza Virus 4 NOT DETECTED NOT DETECTED Final   Respiratory Syncytial Virus NOT DETECTED NOT DETECTED Final   Bordetella pertussis NOT DETECTED NOT DETECTED Final   Bordetella Parapertussis NOT DETECTED NOT DETECTED Final   Chlamydophila pneumoniae NOT DETECTED NOT DETECTED Final   Mycoplasma pneumoniae NOT DETECTED NOT DETECTED Final    Comment: Performed at [facility], [address] [place], [place] [address]  MRSA Next Gen by PCR, Nasal     Status: None   Collection Time: 11/18/22  9:24 PM   Specimen: Nasal Mucosa; Nasal Swab  Result Value Ref Range Status   MRSA by PCR Next Gen NOT DETECTED NOT DETECTED Final    Comment: (NOTE) The GeneXpert MRSA Assay (FDA approved for NASAL specimens only), is one component of a comprehensive MRSA colonization surveillance program. It is not intended to diagnose MRSA infection nor to guide or monitor treatment for MRSA infections. Test performance is not FDA approved in patients less than 67 years old. Performed at [facility], [address] [place], [place] [address]          Radiology Studies: US Venous Img Lower Bilateral (DVT)  Result Date: 11/18/2022 CLINICAL DATA:  Elevated D-dimer. EXAM: BILATERAL LOWER EXTREMITY VENOUS DOPPLER ULTRASOUND TECHNIQUE: Gray-scale sonography with graded compression, as well as color Doppler and duplex ultrasound were performed to evaluate the lower extremity deep venous systems from the level of the common femoral vein and including the common femoral, femoral, profunda femoral, popliteal and calf veins including the posterior tibial, peroneal and gastrocnemius  veins when visible. The superficial great saphenous vein was also interrogated. Spectral Doppler was utilized to evaluate flow at rest and with distal augmentation maneuvers in the common femoral, femoral and popliteal veins. COMPARISON:  None Available. FINDINGS: RIGHT LOWER EXTREMITY  Common Femoral Vein: No evidence of thrombus. Normal compressibility, respiratory phasicity and response to augmentation. Saphenofemoral Junction: No evidence of thrombus. Normal compressibility and flow on color Doppler imaging. Profunda Femoral Vein: No evidence of thrombus. Normal compressibility and flow on color Doppler imaging. Femoral Vein: No evidence of thrombus. Normal compressibility, respiratory phasicity and response to augmentation. Popliteal Vein: No evidence of thrombus. Normal compressibility, respiratory phasicity and response to augmentation. Calf Veins: Visualized right deep calf veins are patent without thrombus. Other Findings:  None. LEFT LOWER EXTREMITY Common Femoral Vein: No evidence of thrombus. Normal compressibility, respiratory phasicity and response to augmentation. Saphenofemoral Junction: No evidence of thrombus. Normal compressibility and flow on color Doppler imaging. Profunda Femoral Vein: No evidence of thrombus. Normal compressibility and flow on color Doppler imaging. Femoral Vein: No evidence of thrombus. Normal compressibility, respiratory phasicity and response to augmentation. Popliteal Vein: No evidence of thrombus. Normal compressibility, respiratory phasicity and response to augmentation. Calf Veins: Visualized left deep calf veins are patent without thrombus. Other Findings:  None. IMPRESSION: No evidence of deep venous thrombosis in either lower extremity. Electronically Signed   By: Richarda Overlie M.D.   On: 11/18/2022 12:02   CT CHEST WO CONTRAST  Result Date: 11/17/2022 CLINICAL DATA:  89 year old male with history of pleural mass. History of multiple myeloma. * Tracking Code: BO *  EXAM: CT CHEST WITHOUT CONTRAST TECHNIQUE: Multidetector CT imaging of the chest was performed following the standard protocol without IV contrast. RADIATION DOSE REDUCTION: This exam was performed according to the departmental dose-optimization program which includes automated exposure control, adjustment of the mA and/or kV according to patient size and/or use of iterative reconstruction technique. COMPARISON:  CTA of the chest 02/06/2022. CT of the abdomen and pelvis 11/17/2022. FINDINGS: Cardiovascular: Heart size is borderline enlarged. There is no significant pericardial fluid, thickening or pericardial calcification. There is aortic atherosclerosis, as well as atherosclerosis of the great vessels of the mediastinum and the coronary arteries, including calcified atherosclerotic plaque in the left main, left anterior descending, left circumflex and right coronary arteries. Mild calcifications of the aortic valve. Left-sided single-lumen Port-A-Cath with tip terminating at the superior cavoatrial junction. Mediastinum/Nodes: No pathologically enlarged mediastinal or hilar lymph nodes. Please note that accurate exclusion of hilar adenopathy is limited on noncontrast CT scans. Several densely calcified mediastinal lymph nodes are incidentally noted. Esophagus is unremarkable in appearance. No axillary lymphadenopathy. Lungs/Pleura: Enlarging mass-like area of consolidation in the periphery of the left lung base, currently measuring 5.1 x 2.8 cm (recently 2.6 x 2.0 cm on earlier contemporaneously obtained CT of the abdomen and pelvis). Some surrounding ground-glass attenuation also noted posteriorly in the left lower lobe. Trace left pleural effusion lying dependently. Scattered areas of chronic post infectious or inflammatory scarring are again noted in the posterior aspect of the right lung, similar to the prior examination from 02/06/2022 where there are extensive areas of septal thickening, thickening of the  peribronchovascular interstitium, cylindrical bronchiectasis, peripheral bronchiolectasis and nodular areas of scarring and architectural distortion. Upper Abdomen: See separate dictation for recently performed CT of the abdomen and pelvis for full description of findings below the diaphragm. Musculoskeletal: There are no aggressive appearing lytic or blastic lesions noted in the visualized portions of the skeleton. IMPRESSION: 1. Rapid enlargement of mass-like area of apparent airspace consolidation in the left lower lobe. Findings do not suggest malignancy. Primary differential considerations include alveolar hemorrhage in the setting of acute pulmonary infarct, or rapidly progressive pneumonia. Further clinical evaluation is recommended.  If there is clinical concern for pulmonary infarction, repeat PE protocol CT scan should be considered. 2. Trace left pleural effusion lying dependently. 3. 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 4. Aortic atherosclerosis, in addition to left main and three-vessel coronary artery disease. 5. There are calcifications of the aortic valve. Echocardiographic correlation for evaluation of potential valvular dysfunction may be warranted if clinically indicated. Aortic Atherosclerosis (ICD10-I70.0). Electronically Signed   By: Trudie Reed M.D.   On: 11/17/2022 10:14        Scheduled Meds:  acyclovir  400 mg Oral BID   ascorbic acid  500 mg Oral QPM   aspirin EC  81 mg Oral Q breakfast   atorvastatin  40 mg Oral Daily   brimonidine  1 drop Left Eye BID   Chlorhexidine Gluconate Cloth  6 each Topical Daily   cyanocobalamin  1,000 mcg Oral Daily   dorzolamide-timolol  1 drop Left Eye BID   feeding supplement  237 mL Oral TID BM   isosorbide mononitrate  30 mg Oral Daily   levothyroxine  75 mcg Oral QAC breakfast   losartan  100 mg Oral Daily   metoprolol tartrate  25 mg Oral BID   multivitamin-lutein   1 capsule Oral BID   Netarsudil-Latanoprost  1 drop Left Eye QHS   pantoprazole  40 mg Oral Daily   sodium chloride flush  3 mL Intravenous Q12H   sodium chloride flush  3 mL Intravenous Q12H   vitamin E  200 Units Oral QPM   Continuous Infusions:  sodium chloride     ceFEPime (MAXIPIME) IV 2 g (11/19/22 0910)   fluconazole (DIFLUCAN) IV 200 mg (11/18/22 1320)     LOS: 2 days    Time spent: 35 minutes  Signed,  Marcelline Mates, MS4 Working with Dr. Maurilio Lovely

## 2022-11-20 ENCOUNTER — Inpatient Hospital Stay: Payer: Medicare Other

## 2022-11-20 ENCOUNTER — Other Ambulatory Visit: Payer: Self-pay | Admitting: *Deleted

## 2022-11-20 DIAGNOSIS — J189 Pneumonia, unspecified organism: Secondary | ICD-10-CM | POA: Diagnosis not present

## 2022-11-20 DIAGNOSIS — R509 Fever, unspecified: Secondary | ICD-10-CM | POA: Diagnosis not present

## 2022-11-20 DIAGNOSIS — E871 Hypo-osmolality and hyponatremia: Secondary | ICD-10-CM

## 2022-11-20 LAB — BASIC METABOLIC PANEL
Anion gap: 5 (ref 5–15)
BUN: 12 mg/dL (ref 8–23)
CO2: 25 mmol/L (ref 22–32)
Calcium: 8.3 mg/dL — ABNORMAL LOW (ref 8.9–10.3)
Chloride: 98 mmol/L (ref 98–111)
Creatinine, Ser: 0.79 mg/dL (ref 0.61–1.24)
GFR, Estimated: >60 mL/min (ref >60)
Glucose, Bld: 96 mg/dL (ref 70–99)
Potassium: 3.4 mmol/L — ABNORMAL LOW (ref 3.5–5.1)
Sodium: 128 mmol/L — ABNORMAL LOW (ref 135–145)

## 2022-11-20 MED ORDER — DOXYCYCLINE HYCLATE 100 MG PO CAPS: 100.0000 mg | ORAL_CAPSULE | Freq: Two times a day (BID) | ORAL | 0 refills | Status: AC

## 2022-11-20 NOTE — TOC Transition Note (Signed)
Transition of Care [facility]) - CM/SW Discharge Note   Patient Details  Name: Dale [name] MRN: [id number] Date of Birth: 1929/03/11  Transition of Care (TOC) CM/SW Contact:  Catalina Gravel, LCSW Phone Number: 11/20/2022, 12:17 PM   Clinical Narrative:    CSW contacted by secure chat- wife wanted to discuss HHPT. CSW spoke with wife, she stated her husband is ready to DC but has been confused, she wants to know how the HHPT works and when.  CSW advised that she will let [facility] know that he is DC and they will be in touch with family to schedule first visit. Wife said that she is concerned because he typically drives hmself around and now he is confused.  Validated her feelings that he has been in the hospital so will get back to more normal environment and have HHPT- she says they have worked with them before.  CSW text to [facility], advising pt DC ing today.  No other TOC needs.     Barriers to Discharge: No Barriers Identified   Patient Goals and CMS Choice      Discharge Placement                         Discharge Plan and Services Additional resources added to the After Visit Summary for                            [facility] Arranged: PT   Date HH Agency Contacted: 11/19/22 Time HH Agency Contacted: 1131 Representative spoke with at [facility] Agency: Morrie Sheldon  Social Determinants of Health (SDOH) Interventions SDOH Screenings   Food Insecurity: No Food Insecurity (11/17/2022)  Housing: Low Risk  (11/17/2022)  Transportation Needs: No Transportation Needs (11/17/2022)  Utilities: Not At Risk (11/17/2022)  Social Connections: Unknown (09/18/2021)   Received from [facility]  Stress: No Stress Concern Present (10/16/2021)   Received from [facility]  Tobacco Use: Medium Risk (11/17/2022)     Readmission Risk Interventions    11/18/2022    3:29 PM 07/30/2021   10:16 AM  Readmission Risk Prevention Plan  Transportation Screening Complete Complete  PCP or Specialist  Appt within 3-5 Days Not Complete   HRI or Home Care Consult Complete Complete  Social Work Consult for Recovery Care Planning/Counseling Complete Complete  Palliative Care Screening Not Applicable Not Applicable  Medication Review Oceanographer) Complete Complete

## 2022-11-20 NOTE — Consult Note (Signed)
[facility] Customer service manager [facility]) Accountable Care Organization (ACO) [facility] Liaison Note  11/20/2022  Dale [name] 02/07/1929 [id number]  Location: [facility] RN Hospital Liaison screened the patient remotely at [facility].  Insurance:  Medicare   Dale [name] is a 89 y.o. male who is a Primary Care Patient of Dr. Sherwood Gambler. The patient was screened for readmission hospitalization with noted high risk score for unplanned readmission risk with 2 IP in 6 months.  The patient was assessed for potential [facility]) Care Management service needs for post hospital transition for care coordination. Review of patient's electronic medical record reveals patient was Sepsis. Spoke with pt's spouse concerning benefits and available services. Spouse receptive to available services and hospital follow up call via care coordination for possible higher level of care and family support. Liaison will make a referral for care coordinator services.    Plan: [facility] Liaison will continue to follow progress and disposition to asess for post hospital community care coordination/management needs.  Referral request for community care coordination: pending disposition.   [facility] Care Management/Population Health does not replace or interfere with any arrangements made by the Inpatient Transition of Care team.   For questions contact:   Elliot Cousin, RN, [facility] Liaison [place]   Population Health Office Hours MTWF  8:00 am-6:00 pm Off on Thursday [phone] mobile [phone] [Office toll free line] Office Hours are M-F 8:30 - 5 pm 24 hour nurse advise line [phone] Concierge  [email or web address]

## 2022-11-20 NOTE — Progress Notes (Signed)
Mobility Specialist Progress Note:    11/20/22 1018  Mobility  Activity Ambulated with assistance in room;Ambulated with assistance to bathroom;Ambulated with assistance in hallway  Level of Assistance Contact guard assist, steadying assist  Assistive Device Front wheel walker  Distance Ambulated (ft) 80 ft  Range of Motion/Exercises Active;All extremities  Activity Response Tolerated well  Mobility Referral Yes  $Mobility charge 1 Mobility  Mobility Specialist Start Time (ACUTE ONLY) 0945  Mobility Specialist Stop Time (ACUTE ONLY) 1000  Mobility Specialist Time Calculation (min) (ACUTE ONLY) 15 min   Pt received ambulating to/from bathroom, agreeable to mobility session. Ambulated in hallway with RW and CGA for safety. Returned pt to room, assisted pt back into bed, alarm on, call bell in reach, all needs met, nursing notified.   Feliciana Rossetti Mobility Specialist Please contact via Special educational needs teacher or  Rehab office at [phone]

## 2022-11-20 NOTE — Discharge Summary (Addendum)
Physician Discharge Summary  Dale [name] DOB: [birth year] DOA: 11/17/2022  Admit date: 11/17/2022  Discharge date: 11/20/2022  Admitted From: Home  Disposition:  Home  Recommendations for Outpatient Follow-up:  An appointment has been scheduled with your PCP Dr Charletta Cousin on Friday 7/26 at 1:30p, and please bring your wife to this appointment.  Please obtain BMP/CBC in one week Please follow up on the following pending results: None at this time Take antibiotic Doxycycline for 3 days, 7/18-720 We do not feel you are safe to drive at this time, and recommend that you do not drive a vehicle. Follow up with PCP for [facility] assessment  Home Health: PT  Equipment/Devices: Has walker at home  Discharge Condition:Stable  CODE STATUS: DNR   Diet recommendation: Heart Healthy  Brief/Interim Summary: Dale [name] is a 89 y.o. 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 Ellin Saba, who presented via EMS with significant weakness  Dale [name]' wife found him kneeling on the floor by the bed.  Patient states he has been trying to get into the bed but the bed was too tall.  In discussion with the patient he developed abrupt onset of significant weakness wet cough with white phlegm generalized weakness.  Denies shortness of breath.  He was also having some soreness in the left lateral rib cage that is not reproducible with palpation.   Upon presentation to the ED he was febrile with a rectal temperature of 103.2 (quickly normalized), WBCs 8.4k, room air sats between 93- 97%, lactic acid was elevated at 3.1 (improved to 1.3 s/p 1L bolus IVF), CRP elevated at 3.5, Procalcitonin at 0.16, D-dimer at 1.15. Plain chest film showed no acute abnormality.  7/14 CT A/P revealed a new masslike opacity in the base of the left lower lobe with  surrounding groundglass attenuation in the adjacent left lower lobe likely infectious or inflammatory in etiology but neoplasm could not be entirely excluded.  Subsequent 7/14 CT Chest revealed "rapid enlargement of mass like are of apparent airspace consolidation in LLL." He was started on Cefepime, Vancomycin and Diflucan on 7/14. On 7/15, a BLE Venous doppler was negative and pt fevered at night to 100.4. On 7/15, pt is stable on RA without tachypnea, tachycardia, chest pain, or productive cough. Pt continued to clinically improve on Doxycycline for his CAP. He did have moments of confusion, which wife notes is at baseline, but suspect primarily due to remaining in hospital, without other medical reason for waxing and waning nature. Will have HHPT set up with discharge.    Discharge Diagnoses:  Principal Problem:   Fever Active Problems:   Pneumonia of left lower lobe due to infectious organism  Discharge Instructions   Allergies as of 11/20/2022       Reactions   Azithromycin Other (See Comments)   Sore mouth and fever blisters around mouth, sores in nose area as well   Doxazosin Shortness Of Breath   Atenolol Other (See Comments)   UNKNOWN REACTION   Hydrocodone Nausea And Vomiting   Levofloxacin Other (See Comments)   Caused stomach problems.   Morphine Other (See Comments)   "made me crazy"   Penicillins Nausea And Vomiting, Other (See Comments)   Has patient had a PCN reaction causing immediate rash, facial/tongue/throat swelling, SOB or lightheadedness with hypotension: No Has patient had a PCN reaction causing severe rash  involving mucus membranes or skin necrosis: No Has patient had a PCN reaction that required hospitalization No Has patient had a PCN reaction occurring within the last 10 years: No If all of the above answers are "NO", then may proceed with Cephalosporin use.   Sulfonamide Derivatives Nausea And Vomiting        Medication List     TAKE these  medications    acetaminophen 325 MG tablet Commonly known as: TYLENOL Take 2 tablets (650 mg total) by mouth every 6 (six) hours as needed for mild pain (or Fever >/= 101).   acyclovir 400 MG tablet Commonly known as: ZOVIRAX TAKE 1 TABLET BY MOUTH TWICE  DAILY   aspirin EC 81 MG tablet Take 1 tablet (81 mg total) by mouth daily with breakfast. For 30 days Only What changed: additional instructions   atorvastatin 40 MG tablet Commonly known as: LIPITOR TAKE 1 TABLET BY MOUTH IN THE  EVENING   brimonidine 0.2 % ophthalmic solution Commonly known as: ALPHAGAN Place 1 drop into the left eye 2 (two) times daily.   cyanocobalamin 1000 MCG tablet Take 1 tablet (1,000 mcg total) by mouth daily.   dexamethasone 2 MG tablet Commonly known as: DECADRON TAKE 5 TABLETS BY MOUTH ONCE  WEEKLY   dorzolamide-timolol 2-0.5 % ophthalmic solution Commonly known as: COSOPT Place 1 drop into the left eye 2 (two) times daily.   doxycycline 100 MG capsule Commonly known as: VIBRAMYCIN Take 1 capsule (100 mg total) by mouth 2 (two) times daily for 3 days.   feeding supplement Liqd Take 237 mLs by mouth 3 (three) times daily between meals.   furosemide 20 MG tablet Commonly known as: LASIX TAKE 2 TABLETS BY MOUTH DAILY, IF YOU NOTICE ANY SWELLING.   isosorbide mononitrate 60 MG 24 hr tablet Commonly known as: IMDUR TAKE 1 AND 1/2 TABLETS BY MOUTH  IN THE MORNING AND ONE-HALF  TABLET BY MOUTH AT NIGHT   levothyroxine 75 MCG tablet Commonly known as: SYNTHROID Take 75 mcg by mouth daily before breakfast.   lidocaine-prilocaine cream Commonly known as: EMLA Apply 1 Application topically as needed.   losartan 100 MG tablet Commonly known as: COZAAR TAKE 1 TABLET BY MOUTH DAILY   metoprolol tartrate 25 MG tablet Commonly known as: LOPRESSOR TAKE 1 TABLET BY MOUTH IN THE  MORNING AND ONE-HALF TABLET BY  MOUTH IN THE EVENING   nitroGLYCERIN 0.4 MG SL tablet Commonly known as:  NITROSTAT Place 1 tablet (0.4 mg total) under the tongue every 5 (five) minutes as needed. For chest pain   pantoprazole 40 MG tablet Commonly known as: PROTONIX TAKE 1 TABLET BY MOUTH DAILY  BEFORE BREAKFAST What changed: when to take this   potassium chloride SA 20 MEQ tablet Commonly known as: KLOR-CON M Take 20 mEq by mouth daily.   PreserVision/Lutein Caps Take 1 capsule by mouth 2 (two) times daily.   prochlorperazine 10 MG tablet Commonly known as: COMPAZINE Take 1 tablet (10 mg total) by mouth every 6 (six) hours as needed for nausea or vomiting.   Rocklatan 0.02-0.005 % Soln Generic drug: Netarsudil-Latanoprost Place 1 drop into the left eye at bedtime.   triamcinolone cream 0.1 % Commonly known as: KENALOG Apply 1 application. topically 2 (two) times daily as needed (for irritation).   VELCADE IJ Inject as directed once a week.   VITAMIN C PO Take 500 mg by mouth every evening.   vitamin E 200 UNIT capsule Take 200 Units by mouth  every evening.        Follow-up Information     [place] Follow up.   Why: PT will call to schedule your next visit. Contact information: [address] [place] [address] [phone]                Allergies  Allergen Reactions   Azithromycin Other (See Comments)    Sore mouth and fever blisters around mouth, sores in nose area as well   Doxazosin Shortness Of Breath   Atenolol Other (See Comments)    UNKNOWN REACTION   Hydrocodone Nausea And Vomiting   Levofloxacin Other (See Comments)    Caused stomach problems.   Morphine Other (See Comments)    "made me crazy"   Penicillins Nausea And Vomiting and Other (See Comments)    Has patient had a PCN reaction causing immediate rash, facial/tongue/throat swelling, SOB or lightheadedness with hypotension: No Has patient had a PCN reaction causing severe rash involving mucus membranes or skin necrosis: No Has patient had a PCN  reaction that required hospitalization No Has patient had a PCN reaction occurring within the last 10 years: No If all of the above answers are "NO", then may proceed with Cephalosporin use.    Sulfonamide Derivatives Nausea And Vomiting    Consultations: Oncology   Procedures/Studies: US Venous Img Lower Bilateral (DVT)  Result Date: 11/18/2022 CLINICAL DATA:  Elevated D-dimer. EXAM: BILATERAL LOWER EXTREMITY VENOUS DOPPLER ULTRASOUND TECHNIQUE: Gray-scale sonography with graded compression, as well as color Doppler and duplex ultrasound were performed to evaluate the lower extremity deep venous systems from the level of the common femoral vein and including the common femoral, femoral, profunda femoral, popliteal and calf veins including the posterior tibial, peroneal and gastrocnemius veins when visible. The superficial great saphenous vein was also interrogated. Spectral Doppler was utilized to evaluate flow at rest and with distal augmentation maneuvers in the common femoral, femoral and popliteal veins. COMPARISON:  None Available. FINDINGS: RIGHT LOWER EXTREMITY Common Femoral Vein: No evidence of thrombus. Normal compressibility, respiratory phasicity and response to augmentation. Saphenofemoral Junction: No evidence of thrombus. Normal compressibility and flow on color Doppler imaging. Profunda Femoral Vein: No evidence of thrombus. Normal compressibility and flow on color Doppler imaging. Femoral Vein: No evidence of thrombus. Normal compressibility, respiratory phasicity and response to augmentation. Popliteal Vein: No evidence of thrombus. Normal compressibility, respiratory phasicity and response to augmentation. Calf Veins: Visualized right deep calf veins are patent without thrombus. Other Findings:  None. LEFT LOWER EXTREMITY Common Femoral Vein: No evidence of thrombus. Normal compressibility, respiratory phasicity and response to augmentation. Saphenofemoral Junction: No evidence of  thrombus. Normal compressibility and flow on color Doppler imaging. Profunda Femoral Vein: No evidence of thrombus. Normal compressibility and flow on color Doppler imaging. Femoral Vein: No evidence of thrombus. Normal compressibility, respiratory phasicity and response to augmentation. Popliteal Vein: No evidence of thrombus. Normal compressibility, respiratory phasicity and response to augmentation. Calf Veins: Visualized left deep calf veins are patent without thrombus. Other Findings:  None. IMPRESSION: No evidence of deep venous thrombosis in either lower extremity. Electronically Signed   By: Richarda Overlie M.D.   On: 11/18/2022 12:02   CT CHEST WO CONTRAST  Result Date: 11/17/2022 CLINICAL DATA:  89 year old male with history of pleural mass. History of multiple myeloma. * Tracking Code: BO * EXAM: CT CHEST WITHOUT CONTRAST TECHNIQUE: Multidetector CT imaging of the chest was performed following the standard protocol without IV contrast. RADIATION  DOSE REDUCTION: This exam was performed according to the departmental dose-optimization program which includes automated exposure control, adjustment of the mA and/or kV according to patient size and/or use of iterative reconstruction technique. COMPARISON:  CTA of the chest 02/06/2022. CT of the abdomen and pelvis 11/17/2022. FINDINGS: Cardiovascular: Heart size is borderline enlarged. There is no significant pericardial fluid, thickening or pericardial calcification. There is aortic atherosclerosis, as well as atherosclerosis of the great vessels of the mediastinum and the coronary arteries, including calcified atherosclerotic plaque in the left main, left anterior descending, left circumflex and right coronary arteries. Mild calcifications of the aortic valve. Left-sided single-lumen Port-A-Cath with tip terminating at the superior cavoatrial junction. Mediastinum/Nodes: No pathologically enlarged mediastinal or hilar lymph nodes. Please note that accurate  exclusion of hilar adenopathy is limited on noncontrast CT scans. Several densely calcified mediastinal lymph nodes are incidentally noted. Esophagus is unremarkable in appearance. No axillary lymphadenopathy. Lungs/Pleura: Enlarging mass-like area of consolidation in the periphery of the left lung base, currently measuring 5.1 x 2.8 cm (recently 2.6 x 2.0 cm on earlier contemporaneously obtained CT of the abdomen and pelvis). Some surrounding ground-glass attenuation also noted posteriorly in the left lower lobe. Trace left pleural effusion lying dependently. Scattered areas of chronic post infectious or inflammatory scarring are again noted in the posterior aspect of the right lung, similar to the prior examination from 02/06/2022 where there are extensive areas of septal thickening, thickening of the peribronchovascular interstitium, cylindrical bronchiectasis, peripheral bronchiolectasis and nodular areas of scarring and architectural distortion. Upper Abdomen: See separate dictation for recently performed CT of the abdomen and pelvis for full description of findings below the diaphragm. Musculoskeletal: There are no aggressive appearing lytic or blastic lesions noted in the visualized portions of the skeleton. IMPRESSION: 1. Rapid enlargement of mass-like area of apparent airspace consolidation in the left lower lobe. Findings do not suggest malignancy. Primary differential considerations include alveolar hemorrhage in the setting of acute pulmonary infarct, or rapidly progressive pneumonia. Further clinical evaluation is recommended. If there is clinical concern for pulmonary infarction, repeat PE protocol CT scan should be considered. 2. Trace left pleural effusion lying dependently. 3. 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 4. Aortic atherosclerosis, in addition to left main and three-vessel coronary artery disease. 5. There are calcifications  of the aortic valve. Echocardiographic correlation for evaluation of potential valvular dysfunction may be warranted if clinically indicated. Aortic Atherosclerosis (ICD10-I70.0). Electronically Signed   By: Trudie Reed M.D.   On: 11/17/2022 10:14   CT ABDOMEN PELVIS W CONTRAST  Result Date: 11/17/2022 CLINICAL DATA:  89 year old male history of weakness. Possible sepsis. Currently undergoing chemotherapy. History of multiple myeloma. * Tracking Code: BO * EXAM: CT ABDOMEN AND PELVIS WITH CONTRAST TECHNIQUE: Multidetector CT imaging of the abdomen and pelvis was performed using the standard protocol following bolus administration of intravenous contrast. RADIATION DOSE REDUCTION: This exam was performed according to the departmental dose-optimization program which includes automated exposure control, adjustment of the mA and/or kV according to patient size and/or use of iterative reconstruction technique. CONTRAST:  80mL OMNIPAQUE IOHEXOL 300 MG/ML  SOLN COMPARISON:  CT of the chest, abdomen and pelvis 01/16/2022. FINDINGS: Lower chest: In the periphery of the left lower lobe (axial image 9 of series 4) there is a new mass-like area measuring 2.6 x 2.0 cm. Some dependent ground-glass attenuation and focal volume loss and architectural distortion is noted in the surrounding lung parenchyma in the base  of the left lower lobe. Atherosclerotic calcifications are noted in the descending thoracic aorta as well as the left main, left anterior descending, left circumflex and right coronary arteries. Hepatobiliary: Low-attenuation lesion which measures 3.7 cm in the left lobe between segments 2 and 3 (axial image 22 of series 2), compatible with a simple cyst (no imaging follow-up recommended). Other 12 mm intermediate attenuation (34 HU) lesion between segment 4A and 8 (axial image 17 of series 2), incompletely characterize, but stable compared to the prior study, presumably a benign lesion such as a mildly  proteinaceous cyst. No other aggressive appearing hepatic lesions are noted. No intra or extrahepatic biliary ductal dilatation. Gallbladder is unremarkable in appearance. Pancreas: No pancreatic mass. No pancreatic ductal dilatation. No pancreatic or peripancreatic fluid collections or inflammatory changes. Spleen: Unremarkable. Adrenals/Urinary Tract: Nonobstructive calculi are noted within the collecting systems of both kidneys measuring up to 4 mm in the interpolar region of the right kidney. Mild cortical thinning in the interpolar region of the left kidney, presumably chronic post infectious or inflammatory scarring. Subcentimeter low-attenuation lesions in both kidneys, too small to definitively characterize, but statistically likely tiny cysts (no imaging follow-up recommended). No aggressive appearing renal lesions are noted. No hydroureteronephrosis. Urinary bladder is unremarkable in appearance. Bilateral adrenal glands are normal in appearance. Stomach/Bowel: The appearance of the stomach is normal. No pathologic dilatation of small bowel or colon. Numerous colonic diverticula are noted, without surrounding inflammatory changes to indicate an acute diverticulitis at this time. Normal appendix. Vascular/Lymphatic: Atherosclerotic calcifications throughout the abdominal aorta and pelvic vasculature, without evidence of aneurysm or dissection. No lymphadenopathy noted in the abdomen or pelvis. Reproductive: Prostate gland and seminal vesicles are unremarkable in appearance. Other: Small right inguinal hernia containing only fat. No significant volume of ascites. No pneumoperitoneum. Musculoskeletal: There are no aggressive appearing lytic or blastic lesions noted in the visualized portions of the skeleton. IMPRESSION: 1. No acute findings are noted in the abdomen or pelvis to account for the patient's symptoms. 2. However, there is a new mass-like opacity in the base of the left lower lobe, with surrounding  ground-glass attenuation in the adjacent left lower lobe. This is potentially of infectious or inflammatory etiology, however, underlying neoplasm is difficult to entirely exclude. Short-term follow-up noncontrast chest CT is suggested in 1 month after trial of antimicrobial therapy to ensure regression of this finding. 3. Colonic diverticulosis without evidence of acute diverticulitis at this time. 4. Nonobstructive calculi in the collecting systems of both kidneys measuring up to 4 mm in the interpolar region of the right kidney. 5. Right inguinal hernia containing only fat. No evidence of associated bowel incarceration or obstruction at this time. 6. Aortic atherosclerosis, in addition to left main and three-vessel coronary artery disease. 7. Additional incidental findings, as above. Electronically Signed   By: Trudie Reed M.D.   On: 11/17/2022 05:18   DG Chest Portable 1 View  Result Date: 11/17/2022 CLINICAL DATA:  Possible sepsis EXAM: PORTABLE CHEST 1 VIEW COMPARISON:  10/07/2022 FINDINGS: Cardiac shadow is stable. Aortic calcifications are noted. Left chest wall port is again seen. Lungs are well aerated bilaterally. No focal infiltrate or sizable effusion is seen. Chronic scarring is again noted bilaterally. No bony abnormality is seen. IMPRESSION: Scarring without acute abnormality. Electronically Signed   By: Alcide Clever M.D.   On: 11/17/2022 03:34     Discharge Exam: Vitals:   11/20/22 0512 11/20/22 1041  BP: (!) 163/67 (!) 145/77  Pulse: 64 67  Resp:  20  Temp:  98.3 F (36.8 C)  SpO2: 100% 100%   Vitals:   11/19/22 2124 11/20/22 0501 11/20/22 0512 11/20/22 1041  BP: (!) 158/76 (!) 191/72 (!) 163/67 (!) 145/77  Pulse: 70 60 64 67  Resp: 17 17  20   Temp: 97.6 F (36.4 C) 97.6 F (36.4 C)  98.3 F (36.8 C)  TempSrc: Oral Oral  Oral  SpO2: 100% 100% 100% 100%  Weight:      Height:        General: Pt is alert, awake, not in acute distress Cardiovascular: RRR, S1/S2 +,  no rubs, no gallops Respiratory: CTA bilaterally, no wheezing, no rhonchi Abdominal: Soft, NT, ND, bowel sounds + Extremities: no edema, no cyanosis  The results of significant diagnostics from this hospitalization (including imaging, microbiology, ancillary and laboratory) are listed below for reference.     Microbiology: Recent Results (from the past 240 hour(s))  Culture, blood (Routine x 2)     Status: None (Preliminary result)   Collection Time: 11/17/22  2:41 AM   Specimen: Left Antecubital; Blood  Result Value Ref Range Status   Specimen Description LEFT ANTECUBITAL  Final   Special Requests   Final    BOTTLES DRAWN AEROBIC AND ANAEROBIC Blood Culture adequate volume   Culture   Final    NO GROWTH 3 DAYS Performed at [facility], [address] [place], [place] [address]    Report Status PENDING  Incomplete  Resp panel by RT-PCR (RSV, Flu A&B, Covid) Anterior Nasal Swab     Status: None   Collection Time: 11/17/22  3:00 AM   Specimen: Anterior Nasal Swab  Result Value Ref Range Status   SARS Coronavirus 2 by RT PCR NEGATIVE NEGATIVE Final    Comment: (NOTE) SARS-CoV-2 target nucleic acids are NOT DETECTED.  The SARS-CoV-2 RNA is generally detectable in upper respiratory specimens during the acute phase of infection. The lowest concentration of SARS-CoV-2 viral copies this assay can detect is 138 copies/mL. A negative result does not preclude SARS-Cov-2 infection and should not be used as the sole basis for treatment or other patient management decisions. A negative result may occur with  improper specimen collection/handling, submission of specimen other than nasopharyngeal swab, presence of viral mutation(s) within the areas targeted by this assay, and inadequate number of viral copies(<138 copies/mL). A negative result must be combined with clinical observations, patient history, and epidemiological information. The expected result is Negative.  Fact Sheet for  Patients:  BloggerCourse.com  Fact Sheet for Healthcare Providers:  SeriousBroker.it  This test is no t yet approved or cleared by the [place] FDA and  has been authorized for detection and/or diagnosis of SARS-CoV-2 by FDA under an Emergency Use Authorization (EUA). This EUA will remain  in effect (meaning this test can be used) for the duration of the COVID-19 declaration under Section 564(b)(1) of the Act, 21 U.S.C.section 360bbb-3(b)(1), unless the authorization is terminated  or revoked sooner.       Influenza A by PCR NEGATIVE NEGATIVE Final   Influenza B by PCR NEGATIVE NEGATIVE Final    Comment: (NOTE) The Xpert Xpress SARS-CoV-2/FLU/RSV plus assay is intended as an aid in the diagnosis of influenza from Nasopharyngeal swab specimens and should not be used as a sole basis for treatment. Nasal washings and aspirates are unacceptable for Xpert Xpress SARS-CoV-2/FLU/RSV testing.  Fact Sheet for Patients: BloggerCourse.com  Fact Sheet for Healthcare Providers: SeriousBroker.it  This test is not yet approved  or cleared by the [place] and has been authorized for detection and/or diagnosis of SARS-CoV-2 by FDA under an Emergency Use Authorization (EUA). This EUA will remain in effect (meaning this test can be used) for the duration of the COVID-19 declaration under Section 564(b)(1) of the Act, 21 U.S.C. section 360bbb-3(b)(1), unless the authorization is terminated or revoked.     Resp Syncytial Virus by PCR NEGATIVE NEGATIVE Final    Comment: (NOTE) Fact Sheet for Patients: BloggerCourse.com  Fact Sheet for Healthcare Providers: SeriousBroker.it  This test is not yet approved or cleared by the [place] FDA and has been authorized for detection and/or diagnosis of SARS-CoV-2 by FDA under an Emergency  Use Authorization (EUA). This EUA will remain in effect (meaning this test can be used) for the duration of the COVID-19 declaration under Section 564(b)(1) of the Act, 21 U.S.C. section 360bbb-3(b)(1), unless the authorization is terminated or revoked.  Performed at [facility], [address] [place], [place] [address]   Culture, blood (Routine x 2)     Status: None (Preliminary result)   Collection Time: 11/17/22  3:08 AM   Specimen: BLOOD RIGHT FOREARM  Result Value Ref Range Status   Specimen Description BLOOD RIGHT FOREARM  Final   Special Requests   Final    BOTTLES DRAWN AEROBIC AND ANAEROBIC Blood Culture adequate volume   Culture   Final    NO GROWTH 3 DAYS Performed at [facility], [address] [place], [place] [address]    Report Status PENDING  Incomplete  SARS Coronavirus 2 by RT PCR (hospital order, performed in [facility] hospital lab) *cepheid single result test* Anterior Nasal Swab     Status: None   Collection Time: 11/17/22 10:04 AM   Specimen: Anterior Nasal Swab  Result Value Ref Range Status   SARS Coronavirus 2 by RT PCR NEGATIVE NEGATIVE Final    Comment: (NOTE) SARS-CoV-2 target nucleic acids are NOT DETECTED.  The SARS-CoV-2 RNA is generally detectable in upper and lower respiratory specimens during the acute phase of infection. The lowest concentration of SARS-CoV-2 viral copies this assay can detect is 250 copies / mL. A negative result does not preclude SARS-CoV-2 infection and should not be used as the sole basis for treatment or other patient management decisions.  A negative result may occur with improper specimen collection / handling, submission of specimen other than nasopharyngeal swab, presence of viral mutation(s) within the areas targeted by this assay, and inadequate number of viral copies (<250 copies / mL). A negative result must be combined with clinical observations, patient history, and epidemiological information.  Fact Sheet  for Patients:   RoadLapTop.co.za  Fact Sheet for Healthcare Providers: http://kim-miller.com/  This test is not yet approved or  cleared by the [place] FDA and has been authorized for detection and/or diagnosis of SARS-CoV-2 by FDA under an Emergency Use Authorization (EUA).  This EUA will remain in effect (meaning this test can be used) for the duration of the COVID-19 declaration under Section 564(b)(1) of the Act, 21 U.S.C. section 360bbb-3(b)(1), unless the authorization is terminated or revoked sooner.  Performed at [facility], [address] [place], [place] [address]   Respiratory (~20 pathogens) panel by PCR     Status: None   Collection Time: 11/17/22  6:40 PM   Specimen: Nasopharyngeal Swab; Respiratory  Result Value Ref Range Status   Adenovirus NOT DETECTED NOT DETECTED Final   Coronavirus 229E NOT DETECTED NOT DETECTED Final  Comment: (NOTE) The Coronavirus on the Respiratory Panel, DOES NOT test for the novel  Coronavirus (2019 nCoV)    Coronavirus HKU1 NOT DETECTED NOT DETECTED Final   Coronavirus NL63 NOT DETECTED NOT DETECTED Final   Coronavirus OC43 NOT DETECTED NOT DETECTED Final   Metapneumovirus NOT DETECTED NOT DETECTED Final   Rhinovirus / Enterovirus NOT DETECTED NOT DETECTED Final   Influenza A NOT DETECTED NOT DETECTED Final   Influenza B NOT DETECTED NOT DETECTED Final   Parainfluenza Virus 1 NOT DETECTED NOT DETECTED Final   Parainfluenza Virus 2 NOT DETECTED NOT DETECTED Final   Parainfluenza Virus 3 NOT DETECTED NOT DETECTED Final   Parainfluenza Virus 4 NOT DETECTED NOT DETECTED Final   Respiratory Syncytial Virus NOT DETECTED NOT DETECTED Final   Bordetella pertussis NOT DETECTED NOT DETECTED Final   Bordetella Parapertussis NOT DETECTED NOT DETECTED Final   Chlamydophila pneumoniae NOT DETECTED NOT DETECTED Final   Mycoplasma pneumoniae NOT DETECTED NOT DETECTED Final    Comment: Performed  at [facility], [address] [place], [place] [address]  MRSA Next Gen by PCR, Nasal     Status: None   Collection Time: 11/18/22  9:24 PM   Specimen: Nasal Mucosa; Nasal Swab  Result Value Ref Range Status   MRSA by PCR Next Gen NOT DETECTED NOT DETECTED Final    Comment: (NOTE) The GeneXpert MRSA Assay (FDA approved for NASAL specimens only), is one component of a comprehensive MRSA colonization surveillance program. It is not intended to diagnose MRSA infection nor to guide or monitor treatment for MRSA infections. Test performance is not FDA approved in patients less than 83 years old. Performed at [facility], [address], [place], [place] [address]      Labs: BNP (last 3 results) No results for input(s): "BNP" in the last 8760 hours. Basic Metabolic Panel: Recent Labs  Lab 11/17/22 0240 11/18/22 0514 11/19/22 0421 11/19/22 1111 11/20/22 0819  NA 132* 127* 132* 125* 128*  K 3.6 3.0* 3.8 3.5 3.4*  CL 99 100 103 98 98  CO2 22 21* 23 21* 25  GLUCOSE 126* 84 98 114* 96  BUN 28* 15 17 15 12   CREATININE 1.09 0.96 0.83 0.78 0.79  CALCIUM 8.9 7.9* 8.6* 8.1* 8.3*  MG  --   --  2.3 2.1  --    Liver Function Tests: Recent Labs  Lab 11/17/22 0240  AST 15  ALT 24  ALKPHOS 67  BILITOT 0.8  PROT 8.5*  ALBUMIN 3.3*   No results for input(s): "LIPASE", "AMYLASE" in the last 168 hours. No results for input(s): "AMMONIA" in the last 168 hours. CBC: Recent Labs  Lab 11/17/22 0240 11/18/22 0514 11/19/22 0421 11/19/22 1111  WBC 8.4 6.0 5.0 5.6  NEUTROABS 7.0  --   --   --   HGB 9.3* 8.6* 9.2* 9.4*  HCT 28.1* 26.1* 27.5* 28.4*  MCV 112.9* 113.5* 112.2* 112.7*  PLT 44* 52* 74* 79*   Cardiac Enzymes: Recent Labs  Lab 11/17/22 0520  CKTOTAL 266   BNP: Invalid input(s): "POCBNP" CBG: No results for input(s): "GLUCAP" in the last 168 hours. D-Dimer No results for input(s): "DDIMER" in the last 72 hours. Hgb A1c No results for input(s): "HGBA1C" in  the last 72 hours. Lipid Profile No results for input(s): "CHOL", "HDL", "LDLCALC", "TRIG", "CHOLHDL", "LDLDIRECT" in the last 72 hours. Thyroid function studies No results for input(s): "TSH", "T4TOTAL", "T3FREE", "THYROIDAB" in the last 72 hours.  Invalid  input(s): "FREET3" Anemia work up No results for input(s): "VITAMINB12", "FOLATE", "FERRITIN", "TIBC", "IRON", "RETICCTPCT" in the last 72 hours. Urinalysis    Component Value Date/Time   COLORURINE YELLOW 11/17/2022 0322   APPEARANCEUR CLEAR 11/17/2022 0322   APPEARANCEUR Clear 08/21/2021 1415   LABSPEC 1.014 11/17/2022 0322   PHURINE 6.0 11/17/2022 0322   GLUCOSEU NEGATIVE 11/17/2022 0322   HGBUR SMALL (A) 11/17/2022 0322   BILIRUBINUR NEGATIVE 11/17/2022 0322   BILIRUBINUR Negative 08/21/2021 1415   KETONESUR NEGATIVE 11/17/2022 0322   PROTEINUR 30 (A) 11/17/2022 0322   UROBILINOGEN 1.0 02/02/2012 0630   NITRITE NEGATIVE 11/17/2022 0322   LEUKOCYTESUR NEGATIVE 11/17/2022 0322   Sepsis Labs Recent Labs  Lab 11/17/22 0240 11/18/22 0514 11/19/22 0421 11/19/22 1111  WBC 8.4 6.0 5.0 5.6   Microbiology Recent Results (from the past 240 hour(s))  Culture, blood (Routine x 2)     Status: None (Preliminary result)   Collection Time: 11/17/22  2:41 AM   Specimen: Left Antecubital; Blood  Result Value Ref Range Status   Specimen Description LEFT ANTECUBITAL  Final   Special Requests   Final    BOTTLES DRAWN AEROBIC AND ANAEROBIC Blood Culture adequate volume   Culture   Final    NO GROWTH 3 DAYS Performed at [facility], [address] [place], [place] [address]    Report Status PENDING  Incomplete  Resp panel by RT-PCR (RSV, Flu A&B, Covid) Anterior Nasal Swab     Status: None   Collection Time: 11/17/22  3:00 AM   Specimen: Anterior Nasal Swab  Result Value Ref Range Status   SARS Coronavirus 2 by RT PCR NEGATIVE NEGATIVE Final    Comment: (NOTE) SARS-CoV-2 target nucleic acids are NOT DETECTED.  The SARS-CoV-2  RNA is generally detectable in upper respiratory specimens during the acute phase of infection. The lowest concentration of SARS-CoV-2 viral copies this assay can detect is 138 copies/mL. A negative result does not preclude SARS-Cov-2 infection and should not be used as the sole basis for treatment or other patient management decisions. A negative result may occur with  improper specimen collection/handling, submission of specimen other than nasopharyngeal swab, presence of viral mutation(s) within the areas targeted by this assay, and inadequate number of viral copies(<138 copies/mL). A negative result must be combined with clinical observations, patient history, and epidemiological information. The expected result is Negative.  Fact Sheet for Patients:  BloggerCourse.com  Fact Sheet for Healthcare Providers:  SeriousBroker.it  This test is no t yet approved or cleared by the [place] FDA and  has been authorized for detection and/or diagnosis of SARS-CoV-2 by FDA under an Emergency Use Authorization (EUA). This EUA will remain  in effect (meaning this test can be used) for the duration of the COVID-19 declaration under Section 564(b)(1) of the Act, 21 U.S.C.section 360bbb-3(b)(1), unless the authorization is terminated  or revoked sooner.       Influenza A by PCR NEGATIVE NEGATIVE Final   Influenza B by PCR NEGATIVE NEGATIVE Final    Comment: (NOTE) The Xpert Xpress SARS-CoV-2/FLU/RSV plus assay is intended as an aid in the diagnosis of influenza from Nasopharyngeal swab specimens and should not be used as a sole basis for treatment. Nasal washings and aspirates are unacceptable for Xpert Xpress SARS-CoV-2/FLU/RSV testing.  Fact Sheet for Patients: BloggerCourse.com  Fact Sheet for Healthcare Providers: SeriousBroker.it  This test is not yet approved or cleared by the  [place] FDA and has been authorized for detection and/or  diagnosis of SARS-CoV-2 by FDA under an Emergency Use Authorization (EUA). This EUA will remain in effect (meaning this test can be used) for the duration of the COVID-19 declaration under Section 564(b)(1) of the Act, 21 U.S.C. section 360bbb-3(b)(1), unless the authorization is terminated or revoked.     Resp Syncytial Virus by PCR NEGATIVE NEGATIVE Final    Comment: (NOTE) Fact Sheet for Patients: BloggerCourse.com  Fact Sheet for Healthcare Providers: SeriousBroker.it  This test is not yet approved or cleared by the [place] FDA and has been authorized for detection and/or diagnosis of SARS-CoV-2 by FDA under an Emergency Use Authorization (EUA). This EUA will remain in effect (meaning this test can be used) for the duration of the COVID-19 declaration under Section 564(b)(1) of the Act, 21 U.S.C. section 360bbb-3(b)(1), unless the authorization is terminated or revoked.  Performed at [facility], [address] [place], [place] [address]   Culture, blood (Routine x 2)     Status: None (Preliminary result)   Collection Time: 11/17/22  3:08 AM   Specimen: BLOOD RIGHT FOREARM  Result Value Ref Range Status   Specimen Description BLOOD RIGHT FOREARM  Final   Special Requests   Final    BOTTLES DRAWN AEROBIC AND ANAEROBIC Blood Culture adequate volume   Culture   Final    NO GROWTH 3 DAYS Performed at [facility], [address], [place], [place] [address]    Report Status PENDING  Incomplete  SARS Coronavirus 2 by RT PCR (hospital order, performed in [facility] hospital lab) *cepheid single result test* Anterior Nasal Swab     Status: None   Collection Time: 11/17/22 10:04 AM   Specimen: Anterior Nasal Swab  Result Value Ref Range Status   SARS Coronavirus 2 by RT PCR NEGATIVE NEGATIVE Final    Comment: (NOTE) SARS-CoV-2 target nucleic acids are NOT  DETECTED.  The SARS-CoV-2 RNA is generally detectable in upper and lower respiratory specimens during the acute phase of infection. The lowest concentration of SARS-CoV-2 viral copies this assay can detect is 250 copies / mL. A negative result does not preclude SARS-CoV-2 infection and should not be used as the sole basis for treatment or other patient management decisions.  A negative result may occur with improper specimen collection / handling, submission of specimen other than nasopharyngeal swab, presence of viral mutation(s) within the areas targeted by this assay, and inadequate number of viral copies (<250 copies / mL). A negative result must be combined with clinical observations, patient history, and epidemiological information.  Fact Sheet for Patients:   RoadLapTop.co.za  Fact Sheet for Healthcare Providers: http://kim-miller.com/  This test is not yet approved or  cleared by the [place] FDA and has been authorized for detection and/or diagnosis of SARS-CoV-2 by FDA under an Emergency Use Authorization (EUA).  This EUA will remain in effect (meaning this test can be used) for the duration of the COVID-19 declaration under Section 564(b)(1) of the Act, 21 U.S.C. section 360bbb-3(b)(1), unless the authorization is terminated or revoked sooner.  Performed at [facility], [address] [place], [place] [address]   Respiratory (~20 pathogens) panel by PCR     Status: None   Collection Time: 11/17/22  6:40 PM   Specimen: Nasopharyngeal Swab; Respiratory  Result Value Ref Range Status   Adenovirus NOT DETECTED NOT DETECTED Final   Coronavirus 229E NOT DETECTED NOT DETECTED Final    Comment: (NOTE) The Coronavirus on the Respiratory Panel, DOES NOT test for the  novel  Coronavirus (2019 nCoV)    Coronavirus HKU1 NOT DETECTED NOT DETECTED Final   Coronavirus NL63 NOT DETECTED NOT DETECTED Final   Coronavirus OC43 NOT  DETECTED NOT DETECTED Final   Metapneumovirus NOT DETECTED NOT DETECTED Final   Rhinovirus / Enterovirus NOT DETECTED NOT DETECTED Final   Influenza A NOT DETECTED NOT DETECTED Final   Influenza B NOT DETECTED NOT DETECTED Final   Parainfluenza Virus 1 NOT DETECTED NOT DETECTED Final   Parainfluenza Virus 2 NOT DETECTED NOT DETECTED Final   Parainfluenza Virus 3 NOT DETECTED NOT DETECTED Final   Parainfluenza Virus 4 NOT DETECTED NOT DETECTED Final   Respiratory Syncytial Virus NOT DETECTED NOT DETECTED Final   Bordetella pertussis NOT DETECTED NOT DETECTED Final   Bordetella Parapertussis NOT DETECTED NOT DETECTED Final   Chlamydophila pneumoniae NOT DETECTED NOT DETECTED Final   Mycoplasma pneumoniae NOT DETECTED NOT DETECTED Final    Comment: Performed at [facility], [address] [place], [place] [address]  MRSA Next Gen by PCR, Nasal     Status: None   Collection Time: 11/18/22  9:24 PM   Specimen: Nasal Mucosa; Nasal Swab  Result Value Ref Range Status   MRSA by PCR Next Gen NOT DETECTED NOT DETECTED Final    Comment: (NOTE) The GeneXpert MRSA Assay (FDA approved for NASAL specimens only), is one component of a comprehensive MRSA colonization surveillance program. It is not intended to diagnose MRSA infection nor to guide or monitor treatment for MRSA infections. Test performance is not FDA approved in patients less than 79 years old. Performed at [facility], [address] [place], [place] [address]      Time coordinating discharge: 35 minutes  SIGNED:  Marcelline Mates, MS4 Working with Dr. Standley Dakins   ATTENDING PHYSICIAN NOTE  Patient seen and examined with Desiree Lucy, Medical student. In addition to supervising the encounter, I played a key role in the decision making process as well as reviewed key findings.  Pt is clinically much improved.  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  We expressed this to patient and family.  He will need to complete full course of antibiotic therapy and follow up with Dr. Ellin Saba to discuss further chemotherapy once the infection has been fully treated.    Rodney Langton, MD  How to contact the [facility] Attending or Consulting provider 7A - 7P or covering provider during after hours 7P -7A, for this patient?  Check the care team in [facility] and look for a) attending/consulting TRH provider listed and b) the [facility] team listed Log into [email or web address] and use [facility]'s universal password to access. If you do not have the password, please contact the hospital operator. Locate the [facility] provider you are looking for under [facility] and page to a number that you can be directly reached. If you still have difficulty reaching the provider, please page the [facility] (Director on Call) for the Hospitalists listed on amion for assistance.

## 2022-11-20 NOTE — Care Management Important Message (Signed)
Important Message  Patient Details  Name: Dale [name] MRN: [id number] Date of Birth: 02/17/1929   Medicare Important Message Given:  N/A - LOS <3 / Initial given by admissions     Corey Harold 11/20/2022, 11:33 AM
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
## 2022-11-21 ENCOUNTER — Telehealth: Payer: Self-pay | Admitting: *Deleted

## 2022-11-21 ENCOUNTER — Encounter: Payer: Self-pay | Admitting: *Deleted

## 2022-11-21 ENCOUNTER — Other Ambulatory Visit: Payer: Self-pay

## 2022-11-21 DIAGNOSIS — C9000 Multiple myeloma not having achieved remission: Secondary | ICD-10-CM

## 2022-11-21 NOTE — Progress Notes (Signed)
Dale [name] was contacted by telephone to verify understanding of discharge instructions status post their most recent discharge from the hospital on the date:  11/20/22.  Inpatient discharge AVS was re-reviewed with patient, along with cancer center appointments.  Verification of understanding for oncology specific follow-up was validated using the Teach Back method.    Transportation to appointments were confirmed for the patient as being self/caregiver.  Dale [name]'s questions were addressed to their satisfaction upon completion of this post discharge follow-up call for outpatient oncology.

## 2022-11-21 NOTE — Progress Notes (Signed)
  Care Coordination   Note   11/21/2022 Name: DOREAN DANIELLO MRN: [id number] DOB: Apr 26, 1929  ALYX GEE is a 89 y.o. year old male who sees Pcp, No for primary care. I reached out to Kennon Portela by phone today to offer care coordination services.  Mr. Kunath was given information about Care Coordination services today including:   The Care Coordination services include support from the care team which includes your Nurse Coordinator, Clinical Social Worker, or Pharmacist.  The Care Coordination team is here to help remove barriers to the health concerns and goals most important to you. Care Coordination services are voluntary, and the patient may decline or stop services at any time by request to their care team member.   Care Coordination Consent Status: Patient agreed to services and verbal consent obtained.   Follow up plan:  Telephone appointment with care coordination team member scheduled for:  11/22/2022  Encounter Outcome:  Pt. Scheduled from referral   Burman Nieves, [facility] Care Coordination Care Guide Direct Dial: [phone]

## 2022-11-22 ENCOUNTER — Encounter: Payer: Self-pay | Admitting: *Deleted

## 2022-11-22 LAB — CULTURE, BLOOD (ROUTINE X 2)
Culture: NO GROWTH
Culture: NO GROWTH
Special Requests: ADEQUATE
Special Requests: ADEQUATE

## 2022-11-23 DIAGNOSIS — K219 Gastro-esophageal reflux disease without esophagitis: Secondary | ICD-10-CM | POA: Diagnosis not present

## 2022-11-23 DIAGNOSIS — D539 Nutritional anemia, unspecified: Secondary | ICD-10-CM | POA: Diagnosis not present

## 2022-11-23 DIAGNOSIS — I2510 Atherosclerotic heart disease of native coronary artery without angina pectoris: Secondary | ICD-10-CM | POA: Diagnosis not present

## 2022-11-23 DIAGNOSIS — Z556 Problems related to health literacy: Secondary | ICD-10-CM | POA: Diagnosis not present

## 2022-11-23 DIAGNOSIS — E876 Hypokalemia: Secondary | ICD-10-CM | POA: Diagnosis not present

## 2022-11-23 DIAGNOSIS — I110 Hypertensive heart disease with heart failure: Secondary | ICD-10-CM | POA: Diagnosis not present

## 2022-11-23 DIAGNOSIS — Z7952 Long term (current) use of systemic steroids: Secondary | ICD-10-CM | POA: Diagnosis not present

## 2022-11-23 DIAGNOSIS — Z87891 Personal history of nicotine dependence: Secondary | ICD-10-CM | POA: Diagnosis not present

## 2022-11-23 DIAGNOSIS — J188 Other pneumonia, unspecified organism: Secondary | ICD-10-CM | POA: Diagnosis not present

## 2022-11-23 DIAGNOSIS — I5032 Chronic diastolic (congestive) heart failure: Secondary | ICD-10-CM | POA: Diagnosis not present

## 2022-11-23 DIAGNOSIS — E039 Hypothyroidism, unspecified: Secondary | ICD-10-CM | POA: Diagnosis not present

## 2022-11-23 DIAGNOSIS — Z85828 Personal history of other malignant neoplasm of skin: Secondary | ICD-10-CM | POA: Diagnosis not present

## 2022-11-23 DIAGNOSIS — E782 Mixed hyperlipidemia: Secondary | ICD-10-CM | POA: Diagnosis not present

## 2022-11-23 DIAGNOSIS — D860 Sarcoidosis of lung: Secondary | ICD-10-CM | POA: Diagnosis not present

## 2022-11-23 DIAGNOSIS — E46 Unspecified protein-calorie malnutrition: Secondary | ICD-10-CM | POA: Diagnosis not present

## 2022-11-23 DIAGNOSIS — C9000 Multiple myeloma not having achieved remission: Secondary | ICD-10-CM | POA: Diagnosis not present

## 2022-11-23 DIAGNOSIS — K449 Diaphragmatic hernia without obstruction or gangrene: Secondary | ICD-10-CM | POA: Diagnosis not present

## 2022-11-23 DIAGNOSIS — D696 Thrombocytopenia, unspecified: Secondary | ICD-10-CM | POA: Diagnosis not present

## 2022-11-23 DIAGNOSIS — J45909 Unspecified asthma, uncomplicated: Secondary | ICD-10-CM | POA: Diagnosis not present

## 2022-11-23 DIAGNOSIS — H3530 Unspecified macular degeneration: Secondary | ICD-10-CM | POA: Diagnosis not present

## 2022-11-23 DIAGNOSIS — Z7982 Long term (current) use of aspirin: Secondary | ICD-10-CM | POA: Diagnosis not present

## 2022-11-23 DIAGNOSIS — A419 Sepsis, unspecified organism: Secondary | ICD-10-CM | POA: Diagnosis not present

## 2022-11-23 DIAGNOSIS — E871 Hypo-osmolality and hyponatremia: Secondary | ICD-10-CM | POA: Diagnosis not present

## 2022-11-23 DIAGNOSIS — H409 Unspecified glaucoma: Secondary | ICD-10-CM | POA: Diagnosis not present

## 2022-11-23 DIAGNOSIS — D693 Immune thrombocytopenic purpura: Secondary | ICD-10-CM | POA: Diagnosis not present

## 2022-11-25 ENCOUNTER — Other Ambulatory Visit: Payer: Self-pay

## 2022-11-25 ENCOUNTER — Ambulatory Visit: Payer: Self-pay | Admitting: *Deleted

## 2022-11-25 ENCOUNTER — Encounter: Payer: Self-pay | Admitting: *Deleted

## 2022-11-26 DIAGNOSIS — C9000 Multiple myeloma not having achieved remission: Secondary | ICD-10-CM | POA: Diagnosis not present

## 2022-11-26 DIAGNOSIS — I5032 Chronic diastolic (congestive) heart failure: Secondary | ICD-10-CM | POA: Diagnosis not present

## 2022-11-26 DIAGNOSIS — J188 Other pneumonia, unspecified organism: Secondary | ICD-10-CM | POA: Diagnosis not present

## 2022-11-26 DIAGNOSIS — I110 Hypertensive heart disease with heart failure: Secondary | ICD-10-CM | POA: Diagnosis not present

## 2022-11-26 DIAGNOSIS — D696 Thrombocytopenia, unspecified: Secondary | ICD-10-CM | POA: Diagnosis not present

## 2022-11-26 DIAGNOSIS — A419 Sepsis, unspecified organism: Secondary | ICD-10-CM | POA: Diagnosis not present

## 2022-11-26 NOTE — Patient Outreach (Addendum)
Care Coordination   Initial Visit Note   11/25/2022 Name: Dale [name] MRN: [id number] DOB: 1928/07/09  Dale [name] is a 89 y.o. year old male who sees Elfredia Nevins, MD for primary care. I  spoke with patient's wife, Dale [name], by telephone today.  What matters to the patients health and wellness today?  Improving Memory    Goals Addressed             This Visit's Progress    Improving Memory Back to Baseline       Care Coordination Goals: Patient will take medications as prescribed Patient will keep all medical appointments PCP, Dr Sherwood Gambler on 11/29/22 Patient/Wife will consider starting Home Health Physical Therapy as ordered at hospital discharge Wife has contact information  Patient was assessed, but wife didn't think he was ready for PT yet Encouraged to go ahead and start PT because exercising will help with overall health and mobility as well as increase blood flow to his brain Patient will continue to eat meals at regular intervals and will drink nutritional supplement in between as a snack Wife will reach out to PCP with any new or worsening symptoms or seek emergency medical attention if needed Wife will reach out to RN Care Coordinator [phone] with any resource or care coordination needs        SDOH assessments and interventions completed:  Yes  SDOH Interventions Today    Flowsheet Row Most Recent Value  SDOH Interventions   Transportation Interventions Patient Resources (Friends/Family)  Physical Activity Interventions Other (Comments)  [Encouraged to f/u with [facility] for PT. They have contact info. They declined services when they came out to assess after hosp discharge.]        Care Coordination Interventions:  Yes, provided  Interventions Today    Flowsheet Row Most Recent Value  Chronic Disease   Chronic disease during today's visit Other  [Altered mental status]  General Interventions   General Interventions Discussed/Reviewed  General Interventions Discussed, General Interventions Reviewed, Durable Medical Equipment (DME), Doctor Visits  [Reviewed and discussed hospital notes, labs, imaging, & dishcarge instructions. Patient had an episode of confusion one night while hospitalized and has not returned to baseline. Oriented to person and knows other people, but does not remember his home.]  Doctor Visits Discussed/Reviewed Doctor Visits Discussed, Doctor Visits Reviewed, PCP, Specialist  Durable Medical Equipment (DME) Wheelchair  [has a wheelchair but is able to ambulate without assistive devices]  Wheelchair Standard  PCP/Specialist Visits Compliance with follow-up visit  [Follow-up with PCP, Dr Sherwood Gambler on 11/29/22]  Exercise Interventions   Exercise Discussed/Reviewed Physical Activity  Physical Activity Discussed/Reviewed Physical Activity Discussed, Physical Activity Reviewed  [Ambulatory but does not exercise. Home Health PT through [facility] was ordered at discharge. Eval was done but they declined. Wife didn't think he was ready for PT yet. Encouraged to start PT b/c exercise is beneficial for overall health and memory.]  Education Interventions   Education Provided Provided Education  Provided Verbal Education On Nutrition, When to see the doctor, Exercise, Medication, Mental Health/Coping with Illness  Mental Health Interventions   Mental Health Discussed/Reviewed Mental Health Reviewed, Mental Health Discussed, Other  [patient does have some anxiety associated with memory loss. It was worse at night but has leveled off per wife. He is oriented to person but is confused about place. He didn't recognize the rooms in his home when he returned and is relearning them]  Nutrition Interventions   Nutrition Discussed/Reviewed Nutrition Reviewed, Nutrition  Discussed, Increasing proteins, Supplemental nutrition  [hx of protein calory malnutrtion. Has a good appetite per wife and eats meals regularly. Has restarted drinking  Boost twice a day in addition to regular meals.]  Pharmacy Interventions   Pharmacy Dicussed/Reviewed Pharmacy Topics Discussed, Pharmacy Topics Reviewed, Medications and their functions  Safety Interventions   Safety Discussed/Reviewed Safety Discussed, Safety Reviewed, Fall Risk, Home Safety  Home Safety Assistive Devices       Follow up plan: Follow up call scheduled for 7.29/24    Encounter Outcome:  Pt. Visit Completed   Demetrios Loll, BSN, RN-BC RN Care Coordinator [facility]  [facility] Direct Dial: [phone] Main #: [phone]

## 2022-11-27 ENCOUNTER — Inpatient Hospital Stay: Payer: Medicare Other

## 2022-11-27 ENCOUNTER — Inpatient Hospital Stay: Payer: Medicare Other | Admitting: Hematology

## 2022-11-28 ENCOUNTER — Inpatient Hospital Stay: Payer: Medicare Other

## 2022-11-28 ENCOUNTER — Other Ambulatory Visit: Payer: Medicare Other

## 2022-11-28 VITALS — BP 170/67 | HR 62 | Temp 97.7°F | Resp 18

## 2022-11-28 DIAGNOSIS — I110 Hypertensive heart disease with heart failure: Secondary | ICD-10-CM | POA: Diagnosis not present

## 2022-11-28 DIAGNOSIS — Z5112 Encounter for antineoplastic immunotherapy: Secondary | ICD-10-CM | POA: Diagnosis not present

## 2022-11-28 DIAGNOSIS — I5032 Chronic diastolic (congestive) heart failure: Secondary | ICD-10-CM | POA: Diagnosis not present

## 2022-11-28 DIAGNOSIS — C9000 Multiple myeloma not having achieved remission: Secondary | ICD-10-CM

## 2022-11-28 DIAGNOSIS — D630 Anemia in neoplastic disease: Secondary | ICD-10-CM | POA: Diagnosis not present

## 2022-11-28 DIAGNOSIS — A419 Sepsis, unspecified organism: Secondary | ICD-10-CM | POA: Diagnosis not present

## 2022-11-28 DIAGNOSIS — J188 Other pneumonia, unspecified organism: Secondary | ICD-10-CM | POA: Diagnosis not present

## 2022-11-28 DIAGNOSIS — D696 Thrombocytopenia, unspecified: Secondary | ICD-10-CM | POA: Diagnosis not present

## 2022-11-28 DIAGNOSIS — D6481 Anemia due to antineoplastic chemotherapy: Secondary | ICD-10-CM | POA: Diagnosis not present

## 2022-11-28 LAB — COMPREHENSIVE METABOLIC PANEL
ALT: 31 U/L (ref 0–44)
AST: 23 U/L (ref 15–41)
Albumin: 2.7 g/dL — ABNORMAL LOW (ref 3.5–5.0)
Alkaline Phosphatase: 65 U/L (ref 38–126)
Anion gap: 4 — ABNORMAL LOW (ref 5–15)
BUN: 17 mg/dL (ref 8–23)
CO2: 25 mmol/L (ref 22–32)
Calcium: 8.6 mg/dL — ABNORMAL LOW (ref 8.9–10.3)
Chloride: 102 mmol/L (ref 98–111)
Creatinine, Ser: 0.97 mg/dL (ref 0.61–1.24)
GFR, Estimated: >60 mL/min (ref >60)
Glucose, Bld: 102 mg/dL — ABNORMAL HIGH (ref 70–99)
Potassium: 3.8 mmol/L (ref 3.5–5.1)
Sodium: 131 mmol/L — ABNORMAL LOW (ref 135–145)
Total Bilirubin: 0.7 mg/dL (ref 0.3–1.2)
Total Protein: 8 g/dL (ref 6.5–8.1)

## 2022-11-28 LAB — CBC WITH DIFFERENTIAL/PLATELET
Abs Immature Granulocytes: 0.03 10*3/uL (ref 0.00–0.07)
Basophils Absolute: 0 10*3/uL (ref 0.0–0.1)
Basophils Relative: 1 %
Eosinophils Absolute: 0.2 10*3/uL (ref 0.0–0.5)
Eosinophils Relative: 5 %
HCT: 23.2 % — ABNORMAL LOW (ref 39.0–52.0)
Hemoglobin: 7.8 g/dL — ABNORMAL LOW (ref 13.0–17.0)
Immature Granulocytes: 1 %
Lymphocytes Relative: 32 %
Lymphs Abs: 1 10*3/uL (ref 0.7–4.0)
MCH: 40.4 pg — ABNORMAL HIGH (ref 26.0–34.0)
MCHC: 33.6 g/dL (ref 30.0–36.0)
MCV: 120.2 fL — ABNORMAL HIGH (ref 80.0–100.0)
Monocytes Absolute: 0.7 10*3/uL (ref 0.1–1.0)
Monocytes Relative: 23 %
Neutro Abs: 1.2 10*3/uL — ABNORMAL LOW (ref 1.7–7.7)
Neutrophils Relative %: 38 %
Platelets: 162 10*3/uL (ref 150–400)
RBC: 1.93 MIL/uL — ABNORMAL LOW (ref 4.22–5.81)
RDW: 13.4 % (ref 11.5–15.5)
WBC: 3.2 10*3/uL — ABNORMAL LOW (ref 4.0–10.5)
nRBC: 0 % (ref 0.0–0.2)

## 2022-11-28 LAB — PREPARE RBC (CROSSMATCH)

## 2022-11-28 LAB — TYPE AND SCREEN
ABO/RH(D): O POS
Antibody Screen: POSITIVE
DAT, IgG: POSITIVE

## 2022-11-28 LAB — MAGNESIUM: Magnesium: 1.9 mg/dL (ref 1.7–2.4)

## 2022-11-28 MED ORDER — BORTEZOMIB CHEMO SQ INJECTION 3.5 MG (2.5MG/ML)
1.0000 mg/m2 | Freq: Once | INTRAMUSCULAR | Status: AC
  Administered 2022-11-28: 1.75 mg via SUBCUTANEOUS
  Filled 2022-11-28: qty 0.7

## 2022-11-28 MED ORDER — SODIUM CHLORIDE 0.9% FLUSH
10.0000 mL | INTRAVENOUS | Status: DC | PRN
  Administered 2022-11-28: 10 mL

## 2022-11-28 MED ORDER — ONDANSETRON HCL 4 MG/2ML IJ SOLN
8.0000 mg | Freq: Once | INTRAMUSCULAR | Status: AC
  Administered 2022-11-28: 8 mg via INTRAVENOUS
  Filled 2022-11-28: qty 4

## 2022-11-28 MED ORDER — METHYLPREDNISOLONE SODIUM SUCC 125 MG IJ SOLR
100.0000 mg | Freq: Once | INTRAMUSCULAR | Status: AC
  Administered 2022-11-28: 100 mg via INTRAVENOUS
  Filled 2022-11-28: qty 2

## 2022-11-28 MED ORDER — CETIRIZINE HCL 10 MG/ML IV SOLN
10.0000 mg | Freq: Once | INTRAVENOUS | Status: AC
  Administered 2022-11-28: 10 mg via INTRAVENOUS
  Filled 2022-11-28: qty 1

## 2022-11-28 MED ORDER — HEPARIN SOD (PORK) LOCK FLUSH 100 UNIT/ML IV SOLN
500.0000 [IU] | Freq: Once | INTRAVENOUS | Status: AC | PRN
  Administered 2022-11-28: 500 [IU]

## 2022-11-28 MED ORDER — ACETAMINOPHEN 325 MG PO TABS
650.0000 mg | ORAL_TABLET | Freq: Once | ORAL | Status: AC
  Administered 2022-11-28: 650 mg via ORAL
  Filled 2022-11-28: qty 2

## 2022-11-28 MED ORDER — SODIUM CHLORIDE 0.9 % IV SOLN
16.0000 mg/kg | Freq: Once | INTRAVENOUS | Status: AC
  Administered 2022-11-28: 900 mg via INTRAVENOUS
  Filled 2022-11-28: qty 40

## 2022-11-28 MED ORDER — SODIUM CHLORIDE 0.9 % IV SOLN: Freq: Once | INTRAVENOUS | Status: AC

## 2022-11-28 NOTE — Progress Notes (Signed)

## 2022-11-28 NOTE — Progress Notes (Signed)
Hemoglobin is 7.8 today. Will order one unit of blood for tomorrow per MD orders.

## 2022-11-28 NOTE — Patient Instructions (Signed)
[facility]  Discharge Instructions: Thank you for choosing [facility] to provide your oncology and hematology care.  If you have a lab appointment with the Cancer Center - please note that after April 8th, 2024, all labs will be drawn in the cancer center.  You do not have to check in or register with the main entrance as you have in the past but will complete your check-in in the cancer center.  Wear comfortable clothing and clothing appropriate for easy access to any Portacath or PICC line.   We strive to give you quality time with your provider. You may need to reschedule your appointment if you arrive late (15 or more minutes).  Arriving late affects you and other patients whose appointments are after yours.  Also, if you miss three or more appointments without notifying the office, you may be dismissed from the clinic at the provider's discretion.      For prescription refill requests, have your pharmacy contact our office and allow 72 hours for refills to be completed.    Today you received the following chemotherapy and/or immunotherapy agents darzalex and velcade       To help prevent nausea and vomiting after your treatment, we encourage you to take your nausea medication as directed.  BELOW ARE SYMPTOMS THAT SHOULD BE REPORTED IMMEDIATELY: *FEVER GREATER THAN 100.4 F (38 C) OR HIGHER *CHILLS OR SWEATING *NAUSEA AND VOMITING THAT IS NOT CONTROLLED WITH YOUR NAUSEA MEDICATION *UNUSUAL SHORTNESS OF BREATH *UNUSUAL BRUISING OR BLEEDING *URINARY PROBLEMS (pain or burning when urinating, or frequent urination) *BOWEL PROBLEMS (unusual diarrhea, constipation, pain near the anus) TENDERNESS IN MOUTH AND THROAT WITH OR WITHOUT PRESENCE OF ULCERS (sore throat, sores in mouth, or a toothache) UNUSUAL RASH, SWELLING OR PAIN  UNUSUAL VAGINAL DISCHARGE OR ITCHING   Items with * indicate a potential emergency and should be followed up as soon as possible  or go to the Emergency Department if any problems should occur.  Please show the CHEMOTHERAPY ALERT CARD or IMMUNOTHERAPY ALERT CARD at check-in to the Emergency Department and triage nurse.  Should you have questions after your visit or need to cancel or reschedule your appointment, please contact [facility] AT [facility] [phone]  and follow the prompts.  Office hours are 8:00 a.m. to 4:30 p.m. Monday - Friday. Please note that voicemails left after 4:00 p.m. may not be returned until the following business day.  We are closed weekends and major holidays. You have access to a nurse at all times for urgent questions. Please call the main number to the clinic [phone] and follow the prompts.  For any non-urgent questions, you may also contact your provider using MyChart. We now offer e-Visits for anyone 81 and older to request care online for non-urgent symptoms. For details visit [email or web address].   Also download the MyChart app! Go to the app store, search "MyChart", open the app, select [place], and log in with your MyChart username and password.

## 2022-11-29 ENCOUNTER — Inpatient Hospital Stay: Payer: Medicare Other

## 2022-11-29 DIAGNOSIS — E46 Unspecified protein-calorie malnutrition: Secondary | ICD-10-CM | POA: Diagnosis not present

## 2022-11-29 DIAGNOSIS — D6481 Anemia due to antineoplastic chemotherapy: Secondary | ICD-10-CM | POA: Diagnosis not present

## 2022-11-29 DIAGNOSIS — C9000 Multiple myeloma not having achieved remission: Secondary | ICD-10-CM | POA: Diagnosis not present

## 2022-11-29 DIAGNOSIS — J189 Pneumonia, unspecified organism: Secondary | ICD-10-CM | POA: Diagnosis not present

## 2022-11-29 DIAGNOSIS — Z681 Body mass index (BMI) 19 or less, adult: Secondary | ICD-10-CM | POA: Diagnosis not present

## 2022-11-29 DIAGNOSIS — C642 Malignant neoplasm of left kidney, except renal pelvis: Secondary | ICD-10-CM | POA: Diagnosis not present

## 2022-11-29 DIAGNOSIS — I110 Hypertensive heart disease with heart failure: Secondary | ICD-10-CM | POA: Diagnosis not present

## 2022-11-29 DIAGNOSIS — I10 Essential (primary) hypertension: Secondary | ICD-10-CM | POA: Diagnosis not present

## 2022-11-29 DIAGNOSIS — I5030 Unspecified diastolic (congestive) heart failure: Secondary | ICD-10-CM | POA: Diagnosis not present

## 2022-11-30 ENCOUNTER — Other Ambulatory Visit: Payer: Self-pay | Admitting: Hematology

## 2022-11-30 DIAGNOSIS — C9000 Multiple myeloma not having achieved remission: Secondary | ICD-10-CM

## 2022-12-02 ENCOUNTER — Encounter: Payer: Self-pay | Admitting: *Deleted

## 2022-12-02 ENCOUNTER — Other Ambulatory Visit: Payer: Self-pay

## 2022-12-02 ENCOUNTER — Encounter: Payer: Self-pay | Admitting: Hematology

## 2022-12-02 ENCOUNTER — Ambulatory Visit: Payer: Self-pay | Admitting: *Deleted

## 2022-12-02 ENCOUNTER — Inpatient Hospital Stay: Payer: Medicare Other

## 2022-12-02 DIAGNOSIS — C9000 Multiple myeloma not having achieved remission: Secondary | ICD-10-CM

## 2022-12-03 ENCOUNTER — Inpatient Hospital Stay: Payer: Medicare Other

## 2022-12-03 DIAGNOSIS — C9000 Multiple myeloma not having achieved remission: Secondary | ICD-10-CM

## 2022-12-03 DIAGNOSIS — Z5112 Encounter for antineoplastic immunotherapy: Secondary | ICD-10-CM | POA: Diagnosis not present

## 2022-12-03 DIAGNOSIS — D630 Anemia in neoplastic disease: Secondary | ICD-10-CM | POA: Diagnosis not present

## 2022-12-03 DIAGNOSIS — D6481 Anemia due to antineoplastic chemotherapy: Secondary | ICD-10-CM | POA: Diagnosis not present

## 2022-12-03 LAB — CBC
HCT: 23.3 % — ABNORMAL LOW (ref 39.0–52.0)
Hemoglobin: 7.8 g/dL — ABNORMAL LOW (ref 13.0–17.0)
MCH: 40.2 pg — ABNORMAL HIGH (ref 26.0–34.0)
MCHC: 33.5 g/dL (ref 30.0–36.0)
MCV: 120.1 fL — ABNORMAL HIGH (ref 80.0–100.0)
Platelets: 107 10*3/uL — ABNORMAL LOW (ref 150–400)
RBC: 1.94 MIL/uL — ABNORMAL LOW (ref 4.22–5.81)
RDW: 13.7 % (ref 11.5–15.5)
WBC: 3.2 10*3/uL — ABNORMAL LOW (ref 4.0–10.5)
nRBC: 0 % (ref 0.0–0.2)

## 2022-12-03 LAB — SAMPLE TO BLOOD BANK

## 2022-12-03 LAB — TYPE AND SCREEN
ABO/RH(D): O POS
Antibody Screen: POSITIVE
DAT, IgG: POSITIVE
Unit division: 0

## 2022-12-03 LAB — BPAM RBC
Blood Product Expiration Date: 202408232359
Unit Type and Rh: 9500

## 2022-12-03 LAB — PREPARE RBC (CROSSMATCH)

## 2022-12-03 MED ORDER — HEPARIN SOD (PORK) LOCK FLUSH 100 UNIT/ML IV SOLN
500.0000 [IU] | Freq: Once | INTRAVENOUS | Status: AC
  Administered 2022-12-03: 500 [IU] via INTRAVENOUS

## 2022-12-03 MED ORDER — SODIUM CHLORIDE 0.9% FLUSH
10.0000 mL | Freq: Once | INTRAVENOUS | Status: AC
  Administered 2022-12-03: 10 mL via INTRAVENOUS

## 2022-12-03 NOTE — Patient Outreach (Signed)
Care Coordination   Follow Up Visit Note   12/02/2022 Name: Dale [name] MRN: [id number] DOB: February 03, 1929  Dale [name] is a 89 y.o. year old male who sees Elfredia Nevins, MD for primary care. I  spoke with wife, Dale [name], by telephone today.   What matters to the patients health and wellness today?  Managing change in memory    Goals Addressed             This Visit's Progress    Improving Memory Back to Baseline   Not on track    Care Coordination Goals: Patient will take medications as prescribed Patient will keep all medical appointments Patient/Wife will consider starting Home Health Physical Therapy as ordered at hospital discharge Patient will continue to eat meals at regular intervals and will drink nutritional supplement in between as a snack Wife will reach out to PCP with any new or worsening symptoms or seek emergency medical attention if needed Wife will reach out to RN Care Coordinator [phone] with any resource or care coordination needs        SDOH assessments and interventions completed:  Yes  SDOH Interventions Today    Flowsheet Row Most Recent Value  SDOH Interventions   Transportation Interventions Intervention Not Indicated  Financial Strain Interventions Intervention Not Indicated       Care Coordination Interventions:  Yes, provided  Interventions Today    Flowsheet Row Most Recent Value  Chronic Disease   Chronic disease during today's visit Other  [altered mental status]  General Interventions   General Interventions Discussed/Reviewed General Interventions Discussed, General Interventions Reviewed, Doctor Visits, Labs  Labs --  [Patient has CBC and other oncology labs routinely scheduled.]  Doctor Visits Discussed/Reviewed Doctor Visits Discussed, Specialist, Doctor Visits Reviewed, PCP  [Patient saw PCP on 11/29/22 for hosp f/u. Change in cogntion/memory may be his new normal. Wife is agreeable. He is doing better getting around the  house and his longterm memory is intact, but his short term memory is still impaired.]  Horticulturist, commercial (DME) Wheelchair  [Has a wheelchair but is able to walk without assistance.]  Wheelchair Standard  PCP/Specialist Visits Compliance with follow-up visit  [Has rescheduled oncology visit and lab work. Reviewed upcoming appointments with wife. Encouraged compliance.]  Exercise Interventions   Exercise Discussed/Reviewed Physical Activity  Physical Activity Discussed/Reviewed Physical Activity Discussed, Physical Activity Reviewed  [ambulatory but does not exercise. Has not started home health PT. Considering it. Pt is mostly sedentary. He is not driving currently and wife doesn't think it's safe for him to drive. May need assisance from PCP & family to explain this to patient]  Education Interventions   Education Provided Provided Education  Provided Verbal Education On Nutrition, When to see the doctor, Exercise, Mental Health/Coping with Illness  Mental Health Interventions   Mental Health Discussed/Reviewed Other  [anxiety associated with disorientation has improved. He's more comfortable at home than he was in the hospital.]  Nutrition Interventions   Nutrition Discussed/Reviewed Nutrition Discussed, Nutrition Reviewed, Supplemental nutrition, Increasing proteins  [hx of protein calory malnutrtion. Has a good appetite per wife and eats meals regularly. Continue drinking Boost twice a day in addition to regular meals.]  Safety Interventions   Safety Discussed/Reviewed Safety Discussed, Safety Reviewed, Fall Risk, Home Safety  [Avoid driving. Move carefully and change positions slowly to decrease risk for falls. Is not wandering out of the house.]  Home Safety Assistive Devices       Follow up plan: Follow up  call scheduled for 12/23/22    Encounter Outcome:  Pt. Visit Completed   Demetrios Loll, BSN, RN-BC RN Care Coordinator [facility]  [facility] Direct Dial:  [phone] Main #: [phone]

## 2022-12-03 NOTE — Addendum Note (Signed)
Addended by: Dicky Doe D on: 12/03/2022 11:48 AM   Modules accepted: Orders

## 2022-12-03 NOTE — Patient Instructions (Signed)
[facility]  Discharge Instructions: Thank you for choosing [facility] to provide your oncology and hematology care.  If you have a lab appointment with the Cancer Center - please note that after April 8th, 2024, all labs will be drawn in the cancer center.  You do not have to check in or register with the main entrance as you have in the past but will complete your check-in in the cancer center.  Wear comfortable clothing and clothing appropriate for easy access to any Portacath or PICC line.   We strive to give you quality time with your provider. You may need to reschedule your appointment if you arrive late (15 or more minutes).  Arriving late affects you and other patients whose appointments are after yours.  Also, if you miss three or more appointments without notifying the office, you may be dismissed from the clinic at the provider's discretion.      For prescription refill requests, have your pharmacy contact our office and allow 72 hours for refills to be completed.    Today you had your port flush with labs for possible blood transfusion    To help prevent nausea and vomiting after your treatment, we encourage you to take your nausea medication as directed.  BELOW ARE SYMPTOMS THAT SHOULD BE REPORTED IMMEDIATELY: *FEVER GREATER THAN 100.4 F (38 C) OR HIGHER *CHILLS OR SWEATING *NAUSEA AND VOMITING THAT IS NOT CONTROLLED WITH YOUR NAUSEA MEDICATION *UNUSUAL SHORTNESS OF BREATH *UNUSUAL BRUISING OR BLEEDING *URINARY PROBLEMS (pain or burning when urinating, or frequent urination) *BOWEL PROBLEMS (unusual diarrhea, constipation, pain near the anus) TENDERNESS IN MOUTH AND THROAT WITH OR WITHOUT PRESENCE OF ULCERS (sore throat, sores in mouth, or a toothache) UNUSUAL RASH, SWELLING OR PAIN  UNUSUAL VAGINAL DISCHARGE OR ITCHING   Items with * indicate a potential emergency and should be followed up as soon as possible or go to the Emergency  Department if any problems should occur.  Please show the CHEMOTHERAPY ALERT CARD or IMMUNOTHERAPY ALERT CARD at check-in to the Emergency Department and triage nurse.  Should you have questions after your visit or need to cancel or reschedule your appointment, please contact [facility] AT [facility] [phone]  and follow the prompts.  Office hours are 8:00 a.m. to 4:30 p.m. Monday - Friday. Please note that voicemails left after 4:00 p.m. may not be returned until the following business day.  We are closed weekends and major holidays. You have access to a nurse at all times for urgent questions. Please call the main number to the clinic [phone] and follow the prompts.  For any non-urgent questions, you may also contact your provider using MyChart. We now offer e-Visits for anyone 52 and older to request care online for non-urgent symptoms. For details visit [email or web address].   Also download the MyChart app! Go to the app store, search "MyChart", open the app, select [place], and log in with your MyChart username and password.

## 2022-12-03 NOTE — Progress Notes (Signed)
Dale [name] presented for Portacath access and flush. Portacath located left chest wall accessed with  H 20 needle. Good blood return present. Portacath flushed with 20ml NS and 500U/13ml Heparin and needle removed intact. Procedure without incident. Patient tolerated procedure well.   Vitals stable and discharged home from clinic ambulatory. Follow up as scheduled.

## 2022-12-04 ENCOUNTER — Other Ambulatory Visit: Payer: Self-pay

## 2022-12-04 ENCOUNTER — Inpatient Hospital Stay: Payer: Medicare Other | Admitting: Hematology

## 2022-12-04 ENCOUNTER — Inpatient Hospital Stay: Payer: Medicare Other

## 2022-12-04 ENCOUNTER — Other Ambulatory Visit: Payer: Medicare Other

## 2022-12-04 DIAGNOSIS — I110 Hypertensive heart disease with heart failure: Secondary | ICD-10-CM | POA: Diagnosis not present

## 2022-12-04 DIAGNOSIS — A419 Sepsis, unspecified organism: Secondary | ICD-10-CM | POA: Diagnosis not present

## 2022-12-04 DIAGNOSIS — D696 Thrombocytopenia, unspecified: Secondary | ICD-10-CM | POA: Diagnosis not present

## 2022-12-04 DIAGNOSIS — C9000 Multiple myeloma not having achieved remission: Secondary | ICD-10-CM | POA: Diagnosis not present

## 2022-12-04 DIAGNOSIS — J188 Other pneumonia, unspecified organism: Secondary | ICD-10-CM | POA: Diagnosis not present

## 2022-12-04 DIAGNOSIS — I5032 Chronic diastolic (congestive) heart failure: Secondary | ICD-10-CM | POA: Diagnosis not present

## 2022-12-05 ENCOUNTER — Inpatient Hospital Stay: Payer: Medicare Other | Attending: Hematology

## 2022-12-05 ENCOUNTER — Inpatient Hospital Stay: Payer: Medicare Other

## 2022-12-05 DIAGNOSIS — C9000 Multiple myeloma not having achieved remission: Secondary | ICD-10-CM | POA: Diagnosis not present

## 2022-12-05 DIAGNOSIS — Z79620 Long term (current) use of immunosuppressive biologic: Secondary | ICD-10-CM | POA: Diagnosis not present

## 2022-12-05 DIAGNOSIS — D630 Anemia in neoplastic disease: Secondary | ICD-10-CM | POA: Insufficient documentation

## 2022-12-05 DIAGNOSIS — I5032 Chronic diastolic (congestive) heart failure: Secondary | ICD-10-CM | POA: Diagnosis not present

## 2022-12-05 DIAGNOSIS — Z5112 Encounter for antineoplastic immunotherapy: Secondary | ICD-10-CM | POA: Insufficient documentation

## 2022-12-05 DIAGNOSIS — D6481 Anemia due to antineoplastic chemotherapy: Secondary | ICD-10-CM | POA: Diagnosis not present

## 2022-12-05 DIAGNOSIS — T451X5A Adverse effect of antineoplastic and immunosuppressive drugs, initial encounter: Secondary | ICD-10-CM | POA: Insufficient documentation

## 2022-12-05 DIAGNOSIS — A419 Sepsis, unspecified organism: Secondary | ICD-10-CM | POA: Diagnosis not present

## 2022-12-05 DIAGNOSIS — J188 Other pneumonia, unspecified organism: Secondary | ICD-10-CM | POA: Diagnosis not present

## 2022-12-05 DIAGNOSIS — I110 Hypertensive heart disease with heart failure: Secondary | ICD-10-CM | POA: Diagnosis not present

## 2022-12-05 IMAGING — DX DG CHEST 1V PORT
1 series · 1 of 1 positions shown · non-contrast
Comparison: 01/04/2021

CLINICAL DATA: Right-sided chest pain, cough

EXAM:
PORTABLE CHEST 1 VIEW

[chest ap]
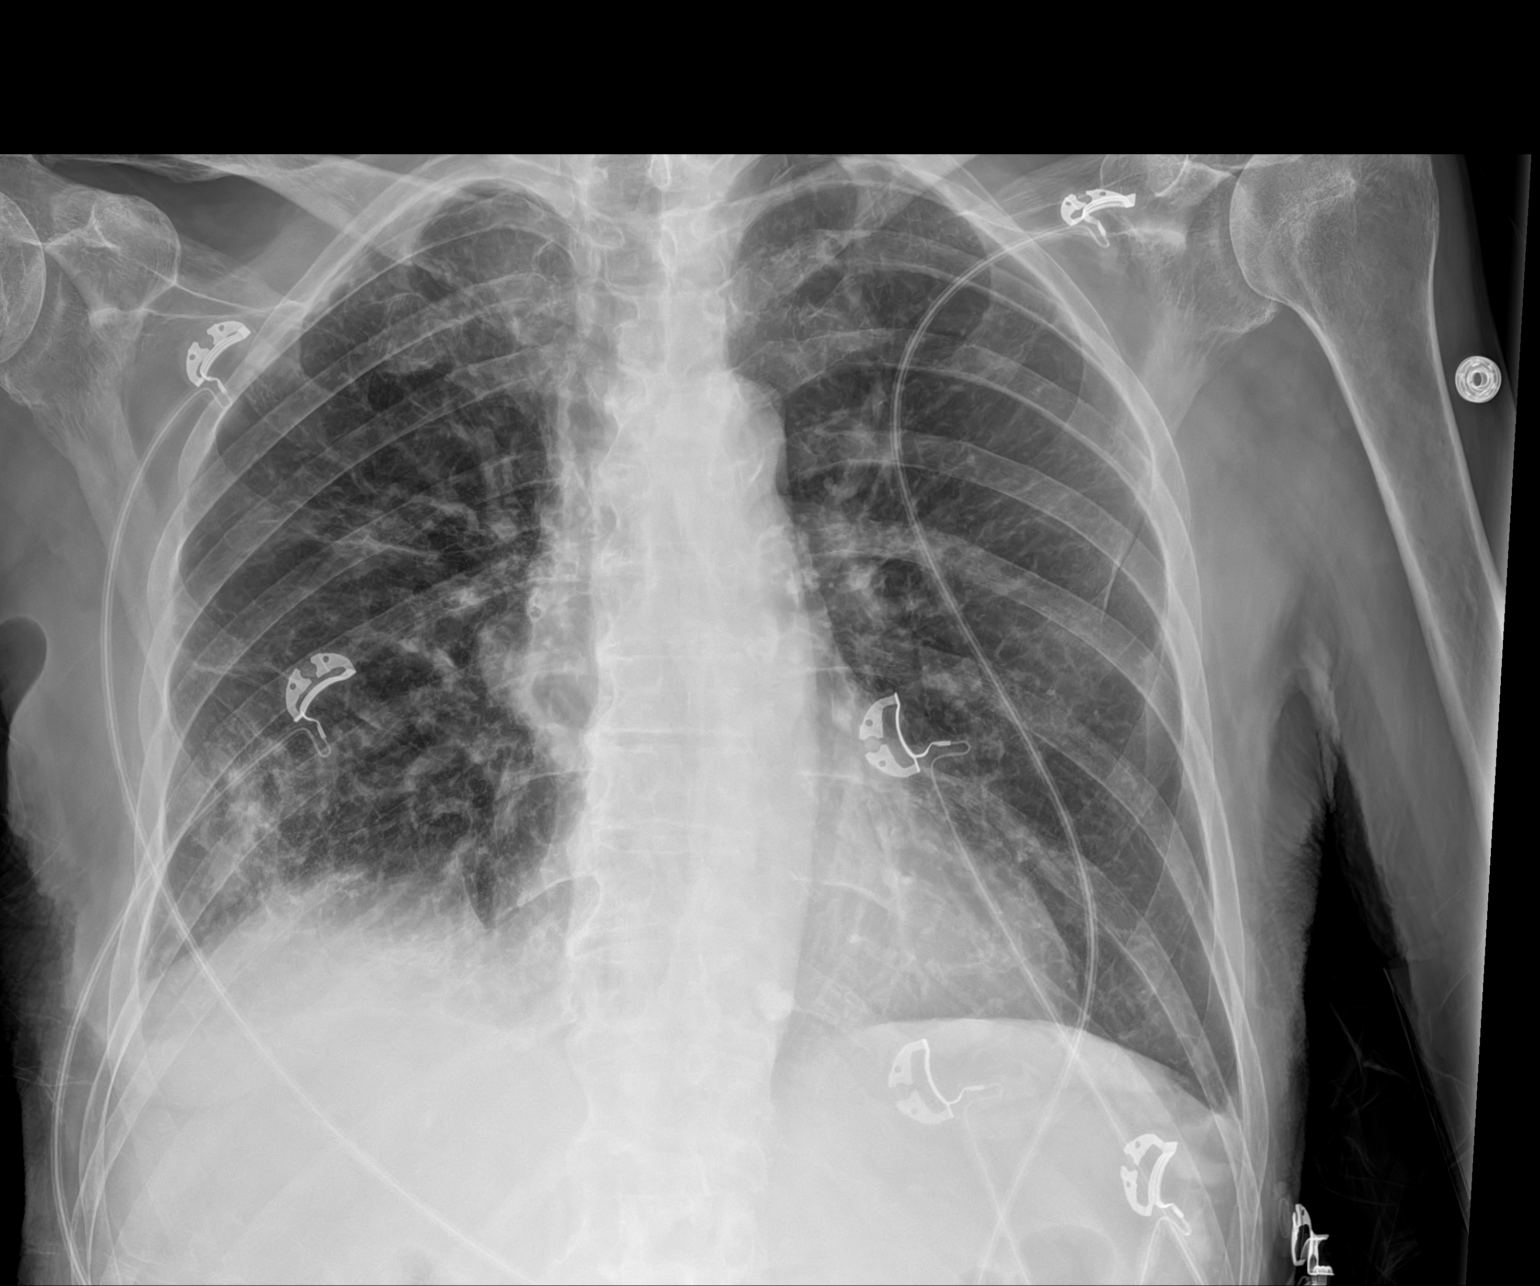

[1 of 1 positions shown; findings below may reference images not displayed]

FINDINGS: Single frontal view of the chest demonstrates a stable cardiac
silhouette. There is patchy bilateral airspace disease within the
perihilar regions, with denser consolidation at the right lung base.
Small right pleural effusion. No pneumothorax. No acute bony
abnormalities.
IMPRESSION: 1. Bilateral airspace disease greatest in the right lower lobe,
consistent with multifocal bronchopneumonia.
2. Small right pleural effusion.

## 2022-12-05 MED ORDER — SODIUM CHLORIDE 0.9% FLUSH
10.0000 mL | INTRAVENOUS | Status: AC | PRN
  Administered 2022-12-05: 10 mL

## 2022-12-05 MED ORDER — DIPHENHYDRAMINE HCL 25 MG PO CAPS
25.0000 mg | ORAL_CAPSULE | Freq: Once | ORAL | Status: AC
  Administered 2022-12-05: 25 mg via ORAL
  Filled 2022-12-05: qty 1

## 2022-12-05 MED ORDER — HEPARIN SOD (PORK) LOCK FLUSH 100 UNIT/ML IV SOLN: 250.0000 [IU] | INTRAVENOUS | Status: DC | PRN

## 2022-12-05 MED ORDER — SODIUM CHLORIDE 0.9% IV SOLUTION
250.0000 mL | Freq: Once | INTRAVENOUS | Status: AC
  Administered 2022-12-05: 250 mL via INTRAVENOUS

## 2022-12-05 MED ORDER — ACETAMINOPHEN 325 MG PO TABS
650.0000 mg | ORAL_TABLET | Freq: Once | ORAL | Status: AC
  Administered 2022-12-05: 650 mg via ORAL
  Filled 2022-12-05: qty 2

## 2022-12-05 NOTE — Patient Instructions (Signed)
[facility]  Discharge Instructions: Thank you for choosing [facility] to provide your oncology and hematology care.  If you have a lab appointment with the Cancer Center - please note that after April 8th, 2024, all labs will be drawn in the cancer center.  You do not have to check in or register with the main entrance as you have in the past but will complete your check-in in the cancer center.  Wear comfortable clothing and clothing appropriate for easy access to any Portacath or PICC line.   We strive to give you quality time with your provider. You may need to reschedule your appointment if you arrive late (15 or more minutes).  Arriving late affects you and other patients whose appointments are after yours.  Also, if you miss three or more appointments without notifying the office, you may be dismissed from the clinic at the provider's discretion.      For prescription refill requests, have your pharmacy contact our office and allow 72 hours for refills to be completed.    Today you received the following, one unit of blood      To help prevent nausea and vomiting after your treatment, we encourage you to take your nausea medication as directed.  BELOW ARE SYMPTOMS THAT SHOULD BE REPORTED IMMEDIATELY: *FEVER GREATER THAN 100.4 F (38 C) OR HIGHER *CHILLS OR SWEATING *NAUSEA AND VOMITING THAT IS NOT CONTROLLED WITH YOUR NAUSEA MEDICATION *UNUSUAL SHORTNESS OF BREATH *UNUSUAL BRUISING OR BLEEDING *URINARY PROBLEMS (pain or burning when urinating, or frequent urination) *BOWEL PROBLEMS (unusual diarrhea, constipation, pain near the anus) TENDERNESS IN MOUTH AND THROAT WITH OR WITHOUT PRESENCE OF ULCERS (sore throat, sores in mouth, or a toothache) UNUSUAL RASH, SWELLING OR PAIN  UNUSUAL VAGINAL DISCHARGE OR ITCHING   Items with * indicate a potential emergency and should be followed up as soon as possible or go to the Emergency Department if any  problems should occur.  Please show the CHEMOTHERAPY ALERT CARD or IMMUNOTHERAPY ALERT CARD at check-in to the Emergency Department and triage nurse.  Should you have questions after your visit or need to cancel or reschedule your appointment, please contact [facility]ENTER AT [facility] [phone]  and follow the prompts.  Office hours are 8:00 a.m. to 4:30 p.m. Monday - Friday. Please note that voicemails left after 4:00 p.m. may not be returned until the following business day.  We are closed weekends and major holidays. You have access to a nurse at all times for urgent questions. Please call the main number to the clinic [phone] and follow the prompts.  For any non-urgent questions, you may also contact your provider using MyChart. We now offer e-Visits for anyone 21 and older to request care online for non-urgent symptoms. For details visit [email or web address].   Also download the MyChart app! Go to the app store, search "MyChart", open the app, select [place], and log in with your MyChart username and password.

## 2022-12-05 NOTE — Progress Notes (Signed)
One unit of blood given per orders. Patient tolerated it well without problems. Vitals stable and discharged home from clinic via wheelchair. Follow up as scheduled.  

## 2022-12-07 IMAGING — DX DG CHEST 2V
3 series · 3 of 3 positions shown · non-contrast
Comparison: Chest x-ray 07/27/2021

CLINICAL DATA: Pneumonia, dyspnea

EXAM:
CHEST - 2 VIEW

[chest ap (1 of 2)]
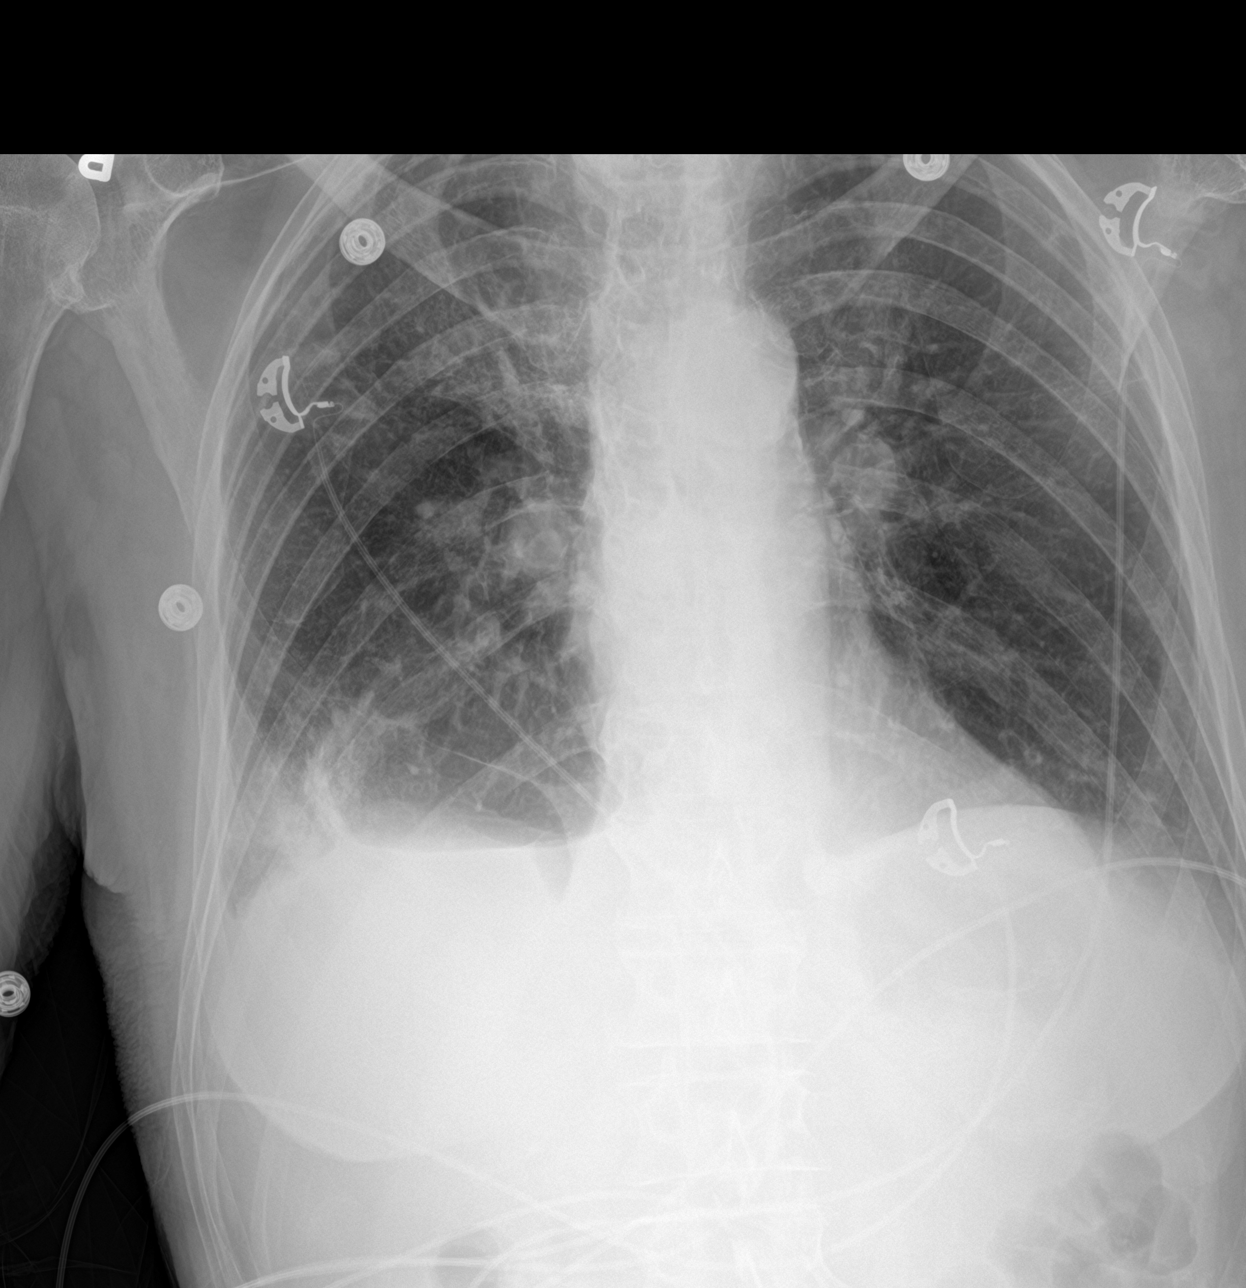

[chest ap (2 of 2)]
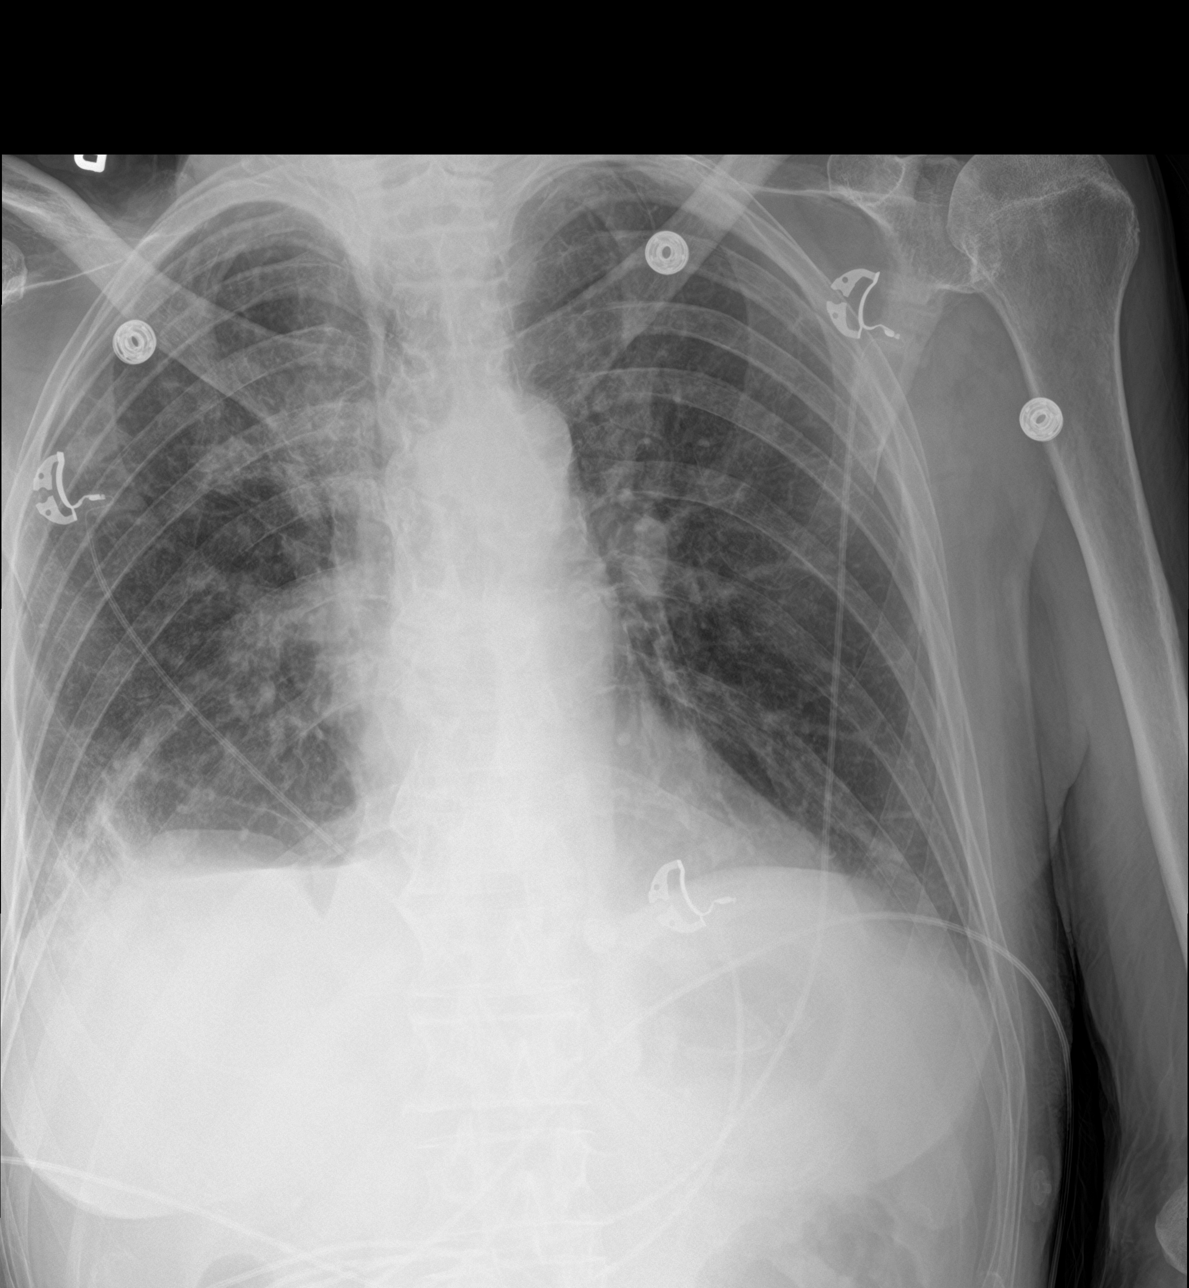

[chest lat]
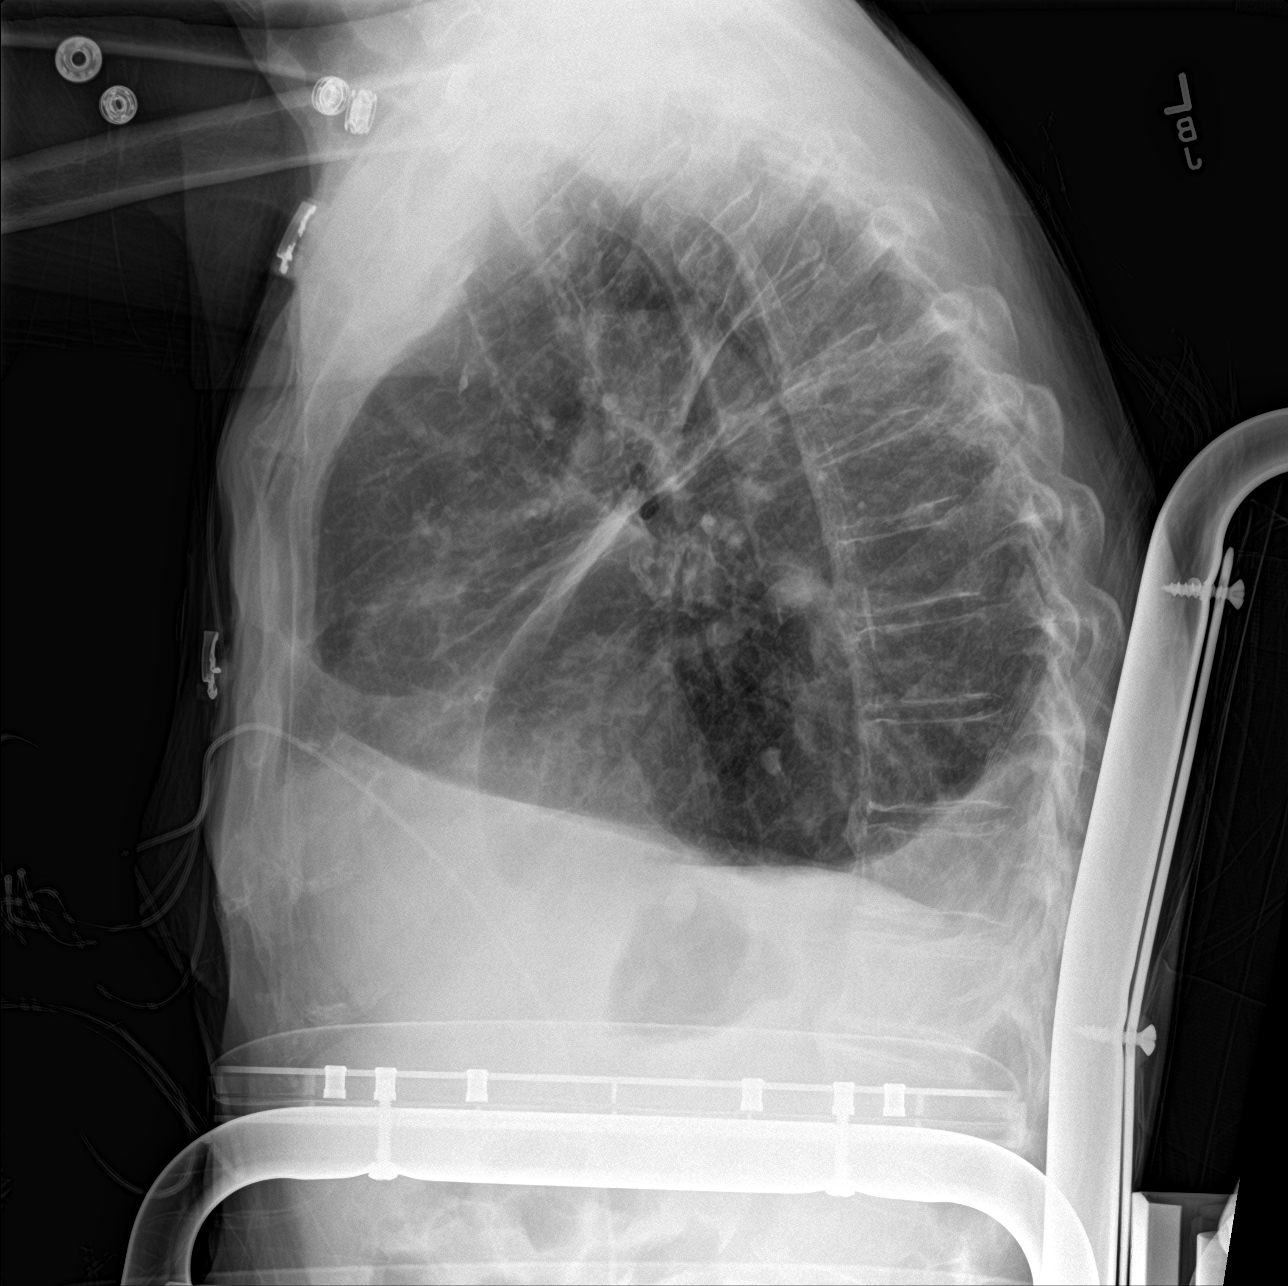

[3 of 3 positions shown; findings below may reference images not displayed]

FINDINGS: Heart size and mediastinum are stable and within normal limits.
Pulmonary vasculature is normal. Persistent increased opacities at
the right lower lung zone/lung base and small right pleural
effusion. No new consolidation identified. No pneumothorax.
IMPRESSION: Persistent small right pleural effusion with associated
infiltrate/atelectasis.

## 2022-12-09 DIAGNOSIS — I5032 Chronic diastolic (congestive) heart failure: Secondary | ICD-10-CM | POA: Diagnosis not present

## 2022-12-09 DIAGNOSIS — J188 Other pneumonia, unspecified organism: Secondary | ICD-10-CM | POA: Diagnosis not present

## 2022-12-09 DIAGNOSIS — D696 Thrombocytopenia, unspecified: Secondary | ICD-10-CM | POA: Diagnosis not present

## 2022-12-09 DIAGNOSIS — A419 Sepsis, unspecified organism: Secondary | ICD-10-CM | POA: Diagnosis not present

## 2022-12-09 DIAGNOSIS — C9000 Multiple myeloma not having achieved remission: Secondary | ICD-10-CM | POA: Diagnosis not present

## 2022-12-09 DIAGNOSIS — I110 Hypertensive heart disease with heart failure: Secondary | ICD-10-CM | POA: Diagnosis not present

## 2022-12-11 NOTE — Progress Notes (Signed)
[facility] [address], [place] [address]    Clinic Day:  12/12/2022  Referring physician: Doreatha Massed, MD  Patient Care Team: Elfredia Nevins, MD as PCP - General (Internal Medicine) Lennette Bihari, MD as PCP - Cardiology (Cardiology) Lennette Bihari, MD as Consulting Physician (Cardiology) Jena Gauss Gerrit Friends, MD as Consulting Physician (Gastroenterology) Doreatha Massed, MD as Medical Oncologist (Oncology) Gwenith Daily, RN as [facility] Care Management   ASSESSMENT & PLAN:   Assessment: 1.  IgG lambda plasma cell myeloma: - Work-up for macrocytic anemia on 12/01/2020 showed M spike of 2.9 g.  Immunofixation IgG lambda. - Lambda light chains elevated at 284.  Light chain ratio was 0.04.  LDH was 163.  Creatinine was 0.9 and calcium 8.9. - Bone marrow biopsy on 12/18/2020-hypercellular marrow for age with 48% atypical plasma cells with lambda light chain restriction. - Chromosome analysis was normal. 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 - Revlimid 5 mg, 2 weeks on/1 week off started on 01/20/2021.  Dexamethasone weekly 10 mg added on 02/07/2021.  Revlimid is discontinued around 06/15/2021 due to poor tolerance and ineffectiveness at low-dose.  Thrombocytopenia and anemia. - Velcade weekly on days 1, 8, 15 every 21 days along with dexamethasone 10 mg weekly started on 06/25/2021.  2.  Macrocytic anemia: - CBC on 12/01/2020 with hemoglobin 8.5 and MCV of 117. - Denies any bleeding per rectum or melena.   3.  Social/family history: - Lives at home with his wife.  He does all ADLs and IADLs.  He even does yard work. - He worked at [employer] for 35 years.  Denies any chemical exposure.  Non-smoker. - No family history of malignancies.     Plan: 1.  IgG lambda plasma cell myeloma, high risk: - He was hospitalized from 11/17/2022 through 11/20/2022  with fever and pneumonia.  I have reviewed hospital records and CT scans. - He reports some cough with white phlegm.  Overall his energy levels have improved. - Reviewed labs today: Normal LFTs.  White count is 3.1 with ANC of 1.5.  Platelet count 143. - We will get multiple myeloma labs today.  He will proceed with cycle 10 today. - RTC 4 weeks for follow-up.  2.  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  Continue CBC weekly to see if he needs transfusion.  3.  Ankle swelling: - Use Lasix as needed.  4.  ID prophylaxis: - Continue acyclovir twice daily and aspirin 81 mg 3 times weekly.     Orders Placed This Encounter  Procedures   Comprehensive metabolic panel    Standing Status:   Future    Standing Expiration Date:   01/16/2024   Magnesium    Standing Status:   Future    Standing Expiration Date:   01/16/2024   CBC with Differential    Standing Status:   Future    Standing Expiration Date:   01/16/2024   Comprehensive metabolic panel    Standing Status:   Future    Standing Expiration Date:   01/23/2024   Magnesium    Standing Status:   Future    Standing Expiration Date:   01/23/2024   CBC with Differential    Standing Status:   Future    Standing Expiration Date:   01/23/2024   Comprehensive metabolic panel  Standing Status:   Future    Standing Expiration Date:   02/13/2024   Magnesium    Standing Status:   Future    Standing Expiration Date:   02/13/2024   CBC with Differential    Standing Status:   Future    Standing Expiration Date:   02/13/2024   Comprehensive metabolic panel    Standing Status:   Future    Standing Expiration Date:   02/20/2024   Magnesium    Standing Status:   Future    Standing Expiration Date:   02/20/2024   CBC with Differential    Standing Status:   Future    Standing Expiration Date:   02/20/2024   Comprehensive metabolic panel    Standing Status:   Future    Standing Expiration Date:   03/12/2024    Magnesium    Standing Status:   Future    Standing Expiration Date:   03/12/2024   CBC with Differential    Standing Status:   Future    Standing Expiration Date:   03/12/2024   Comprehensive metabolic panel    Standing Status:   Future    Standing Expiration Date:   03/19/2024   Magnesium    Standing Status:   Future    Standing Expiration Date:   03/19/2024   CBC with Differential    Standing Status:   Future    Standing Expiration Date:   03/19/2024       Dale [name],acting as a scribe for Doreatha Massed, MD.,have documented all relevant documentation on the behalf of Doreatha Massed, MD,as directed by  Doreatha Massed, MD while in the presence of Doreatha Massed, MD.  I, Doreatha Massed MD, have reviewed the above documentation for accuracy and completeness, and I agree with the above.    Doreatha Massed, MD   8/8/20244:51 PM  CHIEF COMPLAINT:   Diagnosis: multiple myeloma and anemia    Cancer Staging  No matching staging information was found for the patient.    Prior Therapy: none  Current Therapy:  Weekly Velcade and daratumumab    HISTORY OF PRESENT ILLNESS:   Oncology History  Multiple myeloma (HCC)  01/15/2021 Initial Diagnosis   Multiple myeloma (HCC)   06/25/2021 - 12/06/2021 Chemotherapy   Patient is on Treatment Plan : MYELOMA NON-TRANSPLANT CANDIDATES VRd weekly q21d     12/20/2021 -  Chemotherapy   Patient is on Treatment Plan : MYELOMA Daratumumab IV q28d        INTERVAL HISTORY:   Dale [name] is a 89 y.o. male presenting to clinic today for follow up of multiple myeloma and anemia. He was last seen by me on 11/06/22.  Since his last visit, he presented to the ED for sepsis on 11/17/22. CT A/P revealed a new masslike opacity in the base of the left lower lobe with surrounding groundglass attenuation in the adjacent left lower lobe likely infectious or inflammatory in etiology but neoplasm could not be entirely excluded.  CT Chest revealed "rapid enlargement of mass like are of apparent airspace consolidation in LLL." He was started on Cefepime, Vancomycin and Diflucan on 7/14. His condition stabilized and he was prescribed Doxycycline for 3 days after discharge on 7/17.   Today, he states that he is doing well overall. His appetite level is at 80%. His energy level is at 65%.  PAST MEDICAL HISTORY:   Past Medical History: Past Medical History:  Diagnosis Date   Allergic rhinitis    Anal fissure  Anginal pain (HCC)    Asthma    Cancer (HCC)    skin   Chest pain    CHF (congestive heart failure) (HCC)    Coronary heart disease    s/p stenting. cath in 01/2012 noncritical occlusion   Dyspnea    Dysrhythmia    1st degree heart block   GERD (gastroesophageal reflux disease)    Glaucoma    Hiatal hernia    Hyperlipidemia    Hypertension    Hypothyroidism    Idiopathic thrombocytopenic purpura (HCC) 2002   Macular degeneration    Multiple myeloma (HCC) 01/15/2021   Nephrolithiasis    PUD (peptic ulcer disease)    remote   Sarcoidosis    pulmonary   Schatzki's ring     Surgical History: Past Surgical History:  Procedure Laterality Date   cardiac stents     COLONOSCOPY  10/30/2006   Normal rectum, sigmoid diverticula.Remainder of colonic mucosa appeared normal.   CORONARY ANGIOPLASTY WITH STENT PLACEMENT     about 10 years ago per pt (around 2007)   CYSTOSCOPY WITH RETROGRADE PYELOGRAM, URETEROSCOPY AND STENT PLACEMENT Left 06/16/2017   Procedure: CYSTOSCOPY WITH RETROGRADE PYELOGRAM, URETEROSCOPY,STONE EXTRACTION  AND STENT PLACEMENT;  Surgeon: Marcine Matar, MD;  Location: [facility];  Service: Urology;  Laterality: Left;   ESOPHAGOGASTRODUODENOSCOPY  06/19/2004   Two esophageal rings and esophageal web as described above.  All of these were disrupted by passing 56-French Elease Hashimoto dilator/ Candida esophagitis,which appears to be incidental given history of   antibiotic use, but nevertheless  will be treated.   ESOPHAGOGASTRODUODENOSCOPY  10/30/2006   Distal tandem esophageal ring status post dilation disruption as  described above.  Otherwise normal esophagus/  Small hiatal hernia otherwise normal stomach, D1 and D2   ESOPHAGOGASTRODUODENOSCOPY N/A 03/22/2015   Dr.Rourk- noncritical schatzki's ring and hiatal hernia-o/w normal EGD.    ESOPHAGOGASTRODUODENOSCOPY (EGD) WITH ESOPHAGEAL DILATION  03/04/2012   RMR- schatzki's ring, hiatal hernia, polypoid gastric mucosa, bx= minimally active gastritis.   HOLMIUM LASER APPLICATION Left 06/16/2017   Procedure: HOLMIUM LASER APPLICATION;  Surgeon: Marcine Matar, MD;  Location: [facility];  Service: Urology;  Laterality: Left;   IR CV LINE INJECTION  12/27/2021   IR GENERIC HISTORICAL  03/06/2016   IR RADIOLOGIST EVAL & MGMT 03/06/2016 Irish Lack, MD [facility] RAD   IR GENERIC HISTORICAL  06/18/2016   IR RADIOLOGIST EVAL & MGMT 06/18/2016 Irish Lack, MD [facility]   IR IMAGING GUIDED PORT INSERTION  12/18/2021   IR IMAGING GUIDED PORT INSERTION  01/15/2022   IR RADIOLOGIST EVAL & MGMT  10/01/2016   IR RADIOLOGIST EVAL & MGMT  10/15/2017   IR RADIOLOGIST EVAL & MGMT  12/24/2018   IR RADIOLOGIST EVAL & MGMT  01/04/2020   IR RADIOLOGIST EVAL & MGMT  06/20/2021   IR REMOVAL TUN ACCESS W/ PORT W/O FL MOD SED  01/15/2022   LEFT HEART CATH N/A 02/02/2012   Procedure: LEFT HEART CATH;  Surgeon: Runell Gess, MD;  Location: [facility] CATH LAB;  Service: Cardiovascular;  Laterality: N/A;   MEDIASTINOSCOPY     for dx sarcoid   RADIOLOGY WITH ANESTHESIA Left 05/17/2016   Procedure: left renal ablation;  Surgeon: Irish Lack, MD;  Location: [facility];  Service: Radiology;  Laterality: Left;    Social History: Social History   Socioeconomic History   Marital status: Married    Spouse name: Not on file   Number of children: 1   Years of education:  Not on file   Highest education level: Not on file  Occupational History   Occupation:  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    Employer: RETIRED  Tobacco Use   Smoking status: Former    Current packs/day: 0.00    Average packs/day: 0.1 packs/day for 2.0 years (0.2 ttl pk-yrs)    Types: Cigarettes, Cigars    Start date: 05/06/1968    Quit date: 05/06/1970    Years since quitting: 52.6   Smokeless tobacco: Never  Vaping Use   Vaping status: Never Used  Substance and Sexual Activity   Alcohol use: No    Alcohol/week: 0.0 standard drinks of alcohol   Drug use: No   Sexual activity: Never  Other Topics Concern   Not on file  Social History Narrative   Not on file   Social Determinants of Health   Financial Resource Strain: Low Risk  (12/02/2022)   Overall Financial Resource Strain (CARDIA)    Difficulty of Paying Living Expenses: Not very hard  Food Insecurity: No Food Insecurity (11/17/2022)   Hunger Vital Sign    Worried About Running Out of Food in the Last Year: Never true    Ran Out of Food in the Last Year: Never true  Transportation Needs: No Transportation Needs (12/02/2022)   PRAPARE - Administrator, Civil Service (Medical): No    Lack of Transportation (Non-Medical): No  Physical Activity: Inactive (11/25/2022)   Exercise Vital Sign    Days of Exercise per Week: 0 days    Minutes of Exercise per Session: 0 min  Stress: No Stress Concern Present (10/16/2021)   Received from [facility] Occupational [facility] - Occupational Stress Questionnaire    Feeling of Stress : Not at all  Social Connections: Unknown (09/18/2021)   Received from [facility]   Social Network    Social Network: Not on file  Intimate Partner Violence: Not At Risk (11/17/2022)   Humiliation, Afraid, Rape, and Kick questionnaire    Fear of Current or Ex-Partner: No    Emotionally Abused: No    Physically Abused: No    Sexually Abused: No    Family History: Family History  Problem Relation Age of Onset   Heart disease Father         deceased age 30   Stroke Mother    Alzheimer's disease Mother    Heart attack Brother        deceased age 52   Cancer Other        niece   Colon cancer Neg Hx     Current Medications:  Current Outpatient Medications:    acetaminophen (TYLENOL) 325 MG tablet, Take 2 tablets (650 mg total) by mouth every 6 (six) hours as needed for mild pain (or Fever >/= 101)., Disp: , Rfl:    acyclovir (ZOVIRAX) 400 MG tablet, TAKE 1 TABLET BY MOUTH TWICE  DAILY, Disp: 180 tablet, Rfl: 0   Ascorbic Acid (VITAMIN C PO), Take 500 mg by mouth every evening., Disp: , Rfl:    aspirin EC 81 MG tablet, Take 1 tablet (81 mg total) by mouth daily with breakfast. For 30 days Only (Patient taking differently: Take 81 mg by mouth daily with breakfast. Every Monday, Wednesday, and Friday), Disp: 30 tablet, Rfl: 0   atorvastatin (LIPITOR) 40 MG tablet, TAKE 1 TABLET BY MOUTH IN THE  EVENING, Disp: 90 tablet, Rfl: 1   Bortezomib (VELCADE IJ), Inject  as directed once a week., Disp: , Rfl:    brimonidine (ALPHAGAN) 0.2 % ophthalmic solution, Place 1 drop into the left eye 2 (two) times daily., Disp: , Rfl:    cyanocobalamin 1000 MCG tablet, Take 1 tablet (1,000 mcg total) by mouth daily., Disp: 30 tablet, Rfl: 3   dexamethasone (DECADRON) 2 MG tablet, TAKE 5 TABLETS BY MOUTH ONCE  WEEKLY, Disp: 60 tablet, Rfl: 0   dorzolamide-timolol (COSOPT) 22.3-6.8 MG/ML ophthalmic solution, Place 1 drop into the left eye 2 (two) times daily., Disp: , Rfl:    feeding supplement (ENSURE ENLIVE / ENSURE PLUS) LIQD, Take 237 mLs by mouth 3 (three) times daily between meals., Disp: , Rfl:    furosemide (LASIX) 20 MG tablet, TAKE 2 TABLETS BY MOUTH DAILY, IF YOU NOTICE ANY SWELLING., Disp: 90 tablet, Rfl: 1   isosorbide mononitrate (IMDUR) 60 MG 24 hr tablet, TAKE 1 AND 1/2 TABLETS BY MOUTH  IN THE MORNING AND ONE-HALF  TABLET BY MOUTH AT NIGHT, Disp: 180 tablet, Rfl: 2   levothyroxine (SYNTHROID) 75 MCG tablet, Take 75 mcg by mouth daily  before breakfast., Disp: , Rfl:    lidocaine-prilocaine (EMLA) cream, Apply 1 Application topically as needed., Disp: 30 g, Rfl: 1   losartan (COZAAR) 100 MG tablet, TAKE 1 TABLET BY MOUTH DAILY (Patient taking differently: Take 100 mg by mouth daily.), Disp: 90 tablet, Rfl: 3   metoprolol tartrate (LOPRESSOR) 25 MG tablet, TAKE 1 TABLET BY MOUTH IN THE  MORNING AND ONE-HALF TABLET BY  MOUTH IN THE EVENING, Disp: 135 tablet, Rfl: 3   Multiple Vitamins-Minerals (PRESERVISION/LUTEIN) CAPS, Take 1 capsule by mouth 2 (two) times daily., Disp: , Rfl:    nitroGLYCERIN (NITROSTAT) 0.4 MG SL tablet, Place 1 tablet (0.4 mg total) under the tongue every 5 (five) minutes as needed. For chest pain, Disp: 25 tablet, Rfl: 2   pantoprazole (PROTONIX) 40 MG tablet, TAKE 1 TABLET BY MOUTH DAILY  BEFORE BREAKFAST (Patient taking differently: Take 40 mg by mouth daily.), Disp: 90 tablet, Rfl: 3   potassium chloride SA (KLOR-CON M) 20 MEQ tablet, Take 20 mEq by mouth daily., Disp: , Rfl:    prochlorperazine (COMPAZINE) 10 MG tablet, Take 1 tablet (10 mg total) by mouth every 6 (six) hours as needed for nausea or vomiting., Disp: 30 tablet, Rfl: 3   ROCKLATAN 0.02-0.005 % SOLN, Place 1 drop into the left eye at bedtime., Disp: , Rfl:    triamcinolone (KENALOG) 0.1 % cream, Apply 1 application. topically 2 (two) times daily as needed (for irritation)., Disp: , Rfl:    vitamin E 200 UNIT capsule, Take 200 Units by mouth every evening., Disp: , Rfl:  No current facility-administered medications for this visit.  Facility-Administered Medications Ordered in Other Visits:    acetaminophen (TYLENOL) 325 MG tablet, , , ,    diphenhydrAMINE (BENADRYL) 25 mg capsule, , , ,    montelukast (SINGULAIR) 10 MG tablet, , , ,    sodium chloride flush (NS) 0.9 % injection 10 mL, 10 mL, Intracatheter, PRN, Doreatha Massed, MD, 10 mL at 12/12/22 1305   Allergies: Allergies  Allergen Reactions   Azithromycin Other (See Comments)     Sore mouth and fever blisters around mouth, sores in nose area as well   Doxazosin Shortness Of Breath   Atenolol Other (See Comments)    UNKNOWN REACTION   Hydrocodone Nausea And Vomiting   Levofloxacin Other (See Comments)    Caused stomach problems.   Morphine  Other (See Comments)    "made me crazy"   Penicillins Nausea And Vomiting and Other (See Comments)    Has patient had a PCN reaction causing immediate rash, facial/tongue/throat swelling, SOB or lightheadedness with hypotension: No Has patient had a PCN reaction causing severe rash involving mucus membranes or skin necrosis: No Has patient had a PCN reaction that required hospitalization No Has patient had a PCN reaction occurring within the last 10 years: No If all of the above answers are "NO", then may proceed with Cephalosporin use.    Sulfonamide Derivatives Nausea And Vomiting    REVIEW OF SYSTEMS:   Review of Systems  Constitutional:  Negative for chills, fatigue and fever.  HENT:   Negative for lump/mass, mouth sores, nosebleeds, sore throat and trouble swallowing.   Eyes:  Negative for eye problems.  Respiratory:  Negative for cough and shortness of breath.   Cardiovascular:  Negative for chest pain, leg swelling and palpitations.  Gastrointestinal:  Positive for diarrhea. Negative for abdominal pain, constipation, nausea and vomiting.  Genitourinary:  Negative for bladder incontinence, difficulty urinating, dysuria, frequency, hematuria and nocturia.   Musculoskeletal:  Negative for arthralgias, back pain, flank pain, myalgias and neck pain.  Skin:  Negative for itching and rash.  Neurological:  Negative for dizziness, headaches and numbness.  Hematological:  Does not bruise/bleed easily.  Psychiatric/Behavioral:  Negative for depression, sleep disturbance and suicidal ideas. The patient is not nervous/anxious.   All other systems reviewed and are negative.    VITALS:   There were no vitals taken for this  visit.  Wt Readings from Last 3 Encounters:  12/12/22 126 lb 3.2 oz (57.2 kg)  11/28/22 129 lb 3.2 oz (58.6 kg)  11/17/22 130 lb 1.1 oz (59 kg)    There is no height or weight on file to calculate BMI.  Performance status (ECOG): 1 - Symptomatic but completely ambulatory  PHYSICAL EXAM:   Physical Exam Vitals and nursing note reviewed. Exam conducted with a chaperone present.  Constitutional:      Appearance: Normal appearance.  Cardiovascular:     Rate and Rhythm: Normal rate and regular rhythm.     Pulses: Normal pulses.     Heart sounds: Normal heart sounds.  Pulmonary:     Effort: Pulmonary effort is normal.     Breath sounds: Normal breath sounds.  Abdominal:     Palpations: Abdomen is soft. There is no hepatomegaly, splenomegaly or mass.     Tenderness: There is no abdominal tenderness.  Musculoskeletal:     Right lower leg: No edema.     Left lower leg: No edema.  Lymphadenopathy:     Cervical: No cervical adenopathy.     Right cervical: No superficial, deep or posterior cervical adenopathy.    Left cervical: No superficial, deep or posterior cervical adenopathy.     Upper Body:     Right upper body: No supraclavicular or axillary adenopathy.     Left upper body: No supraclavicular or axillary adenopathy.  Neurological:     General: No focal deficit present.     Mental Status: He is alert and oriented to person, place, and time.  Psychiatric:        Mood and Affect: Mood normal.        Behavior: Behavior normal.     LABS:      Latest Ref Rng & Units 12/12/2022    7:52 AM 12/03/2022    9:26 AM 11/28/2022    7:43  AM  CBC  WBC 4.0 - 10.5 K/uL 3.1  3.2  3.2   Hemoglobin 13.0 - 17.0 g/dL 9.5  7.8  7.8   Hematocrit 39.0 - 52.0 % 29.0  23.3  23.2   Platelets 150 - 400 K/uL 143  107  162       Latest Ref Rng & Units 12/12/2022    7:52 AM 11/28/2022    7:43 AM 11/20/2022    8:19 AM  CMP  Glucose 70 - 99 mg/dL 098  119  96   BUN 8 - 23 mg/dL 18  17  12     Creatinine 0.61 - 1.24 mg/dL 1.47  8.29  5.62   Sodium 135 - 145 mmol/L 132  131  128   Potassium 3.5 - 5.1 mmol/L 3.7  3.8  3.4   Chloride 98 - 111 mmol/L 102  102  98   CO2 22 - 32 mmol/L 24  25  25    Calcium 8.9 - 10.3 mg/dL 9.0  8.6  8.3   Total Protein 6.5 - 8.1 g/dL 8.1  8.0    Total Bilirubin 0.3 - 1.2 mg/dL 0.4  0.7    Alkaline Phos 38 - 126 U/L 64  65    AST 15 - 41 U/L 21  23    ALT 0 - 44 U/L 26  31       No results found for: "CEA1", "CEA" / No results found for: "CEA1", "CEA" No results found for: "PSA1" No results found for: "CAN199" No results found for: "CAN125"  Lab Results  Component Value Date   TOTALPROTELP 8.3 10/30/2022   ALBUMINELP 3.5 10/30/2022   A1GS 0.3 10/30/2022   A2GS 0.8 10/30/2022   BETS 0.7 10/30/2022   GAMS 3.0 (H) 10/30/2022   MSPIKE 2.9 (H) 10/30/2022   SPEI Comment 10/30/2022   Lab Results  Component Value Date   TIBC 257 08/23/2021   TIBC 298 02/07/2021   TIBC 263 05/27/2017   FERRITIN 1,415 (H) 01/24/2022   FERRITIN 939 (H) 08/23/2021   FERRITIN 123 02/07/2021   IRONPCTSAT 90 (H) 08/23/2021   IRONPCTSAT 17 (L) 02/07/2021   IRONPCTSAT 44 05/27/2017   Lab Results  Component Value Date   LDH 115 05/01/2021   LDH 120 04/09/2021   LDH 124 04/02/2021     STUDIES:   US Venous Img Lower Bilateral (DVT)  Result Date: 11/18/2022 CLINICAL DATA:  Elevated D-dimer. EXAM: BILATERAL LOWER EXTREMITY VENOUS DOPPLER ULTRASOUND TECHNIQUE: Gray-scale sonography with graded compression, as well as color Doppler and duplex ultrasound were performed to evaluate the lower extremity deep venous systems from the level of the common femoral vein and including the common femoral, femoral, profunda femoral, popliteal and calf veins including the posterior tibial, peroneal and gastrocnemius veins when visible. The superficial great saphenous vein was also interrogated. Spectral Doppler was utilized to evaluate flow at rest and with distal augmentation  maneuvers in the common femoral, femoral and popliteal veins. COMPARISON:  None Available. FINDINGS: RIGHT LOWER EXTREMITY Common Femoral Vein: No evidence of thrombus. Normal compressibility, respiratory phasicity and response to augmentation. Saphenofemoral Junction: No evidence of thrombus. Normal compressibility and flow on color Doppler imaging. Profunda Femoral Vein: No evidence of thrombus. Normal compressibility and flow on color Doppler imaging. Femoral Vein: No evidence of thrombus. Normal compressibility, respiratory phasicity and response to augmentation. Popliteal Vein: No evidence of thrombus. Normal compressibility, respiratory phasicity and response to augmentation. Calf Veins: Visualized right deep calf veins are patent  without thrombus. Other Findings:  None. LEFT LOWER EXTREMITY Common Femoral Vein: No evidence of thrombus. Normal compressibility, respiratory phasicity and response to augmentation. Saphenofemoral Junction: No evidence of thrombus. Normal compressibility and flow on color Doppler imaging. Profunda Femoral Vein: No evidence of thrombus. Normal compressibility and flow on color Doppler imaging. Femoral Vein: No evidence of thrombus. Normal compressibility, respiratory phasicity and response to augmentation. Popliteal Vein: No evidence of thrombus. Normal compressibility, respiratory phasicity and response to augmentation. Calf Veins: Visualized left deep calf veins are patent without thrombus. Other Findings:  None. IMPRESSION: No evidence of deep venous thrombosis in either lower extremity. Electronically Signed   By: Richarda Overlie M.D.   On: 11/18/2022 12:02   CT CHEST WO CONTRAST  Result Date: 11/17/2022 CLINICAL DATA:  89 year old male with history of pleural mass. History of multiple myeloma. * Tracking Code: BO * EXAM: CT CHEST WITHOUT CONTRAST TECHNIQUE: Multidetector CT imaging of the chest was performed following the standard protocol without IV contrast. RADIATION DOSE  REDUCTION: This exam was performed according to the departmental dose-optimization program which includes automated exposure control, adjustment of the mA and/or kV according to patient size and/or use of iterative reconstruction technique. COMPARISON:  CTA of the chest 02/06/2022. CT of the abdomen and pelvis 11/17/2022. FINDINGS: Cardiovascular: Heart size is borderline enlarged. There is no significant pericardial fluid, thickening or pericardial calcification. There is aortic atherosclerosis, as well as atherosclerosis of the great vessels of the mediastinum and the coronary arteries, including calcified atherosclerotic plaque in the left main, left anterior descending, left circumflex and right coronary arteries. Mild calcifications of the aortic valve. Left-sided single-lumen Port-A-Cath with tip terminating at the superior cavoatrial junction. Mediastinum/Nodes: No pathologically enlarged mediastinal or hilar lymph nodes. Please note that accurate exclusion of hilar adenopathy is limited on noncontrast CT scans. Several densely calcified mediastinal lymph nodes are incidentally noted. Esophagus is unremarkable in appearance. No axillary lymphadenopathy. Lungs/Pleura: Enlarging mass-like area of consolidation in the periphery of the left lung base, currently measuring 5.1 x 2.8 cm (recently 2.6 x 2.0 cm on earlier contemporaneously obtained CT of the abdomen and pelvis). Some surrounding ground-glass attenuation also noted posteriorly in the left lower lobe. Trace left pleural effusion lying dependently. Scattered areas of chronic post infectious or inflammatory scarring are again noted in the posterior aspect of the right lung, similar to the prior examination from 02/06/2022 where there are extensive areas of septal thickening, thickening of the peribronchovascular interstitium, cylindrical bronchiectasis, peripheral bronchiolectasis and nodular areas of scarring and architectural distortion. Upper Abdomen:  See separate dictation for recently performed CT of the abdomen and pelvis for full description of findings below the diaphragm. Musculoskeletal: There are no aggressive appearing lytic or blastic lesions noted in the visualized portions of the skeleton. IMPRESSION: 1. Rapid enlargement of mass-like area of apparent airspace consolidation in the left lower lobe. Findings do not suggest malignancy. Primary differential considerations include alveolar hemorrhage in the setting of acute pulmonary infarct, or rapidly progressive pneumonia. Further clinical evaluation is recommended. If there is clinical concern for pulmonary infarction, repeat PE protocol CT scan should be considered. 2. Trace left pleural effusion lying dependently. 3. 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 4. Aortic atherosclerosis, in addition to left main and three-vessel coronary artery disease. 5. There are calcifications of the aortic valve. Echocardiographic correlation for evaluation of potential valvular dysfunction may be warranted if clinically indicated. Aortic Atherosclerosis (ICD10-I70.0). Electronically Signed   By:  Trudie Reed M.D.   On: 11/17/2022 10:14   CT ABDOMEN PELVIS W CONTRAST  Result Date: 11/17/2022 CLINICAL DATA:  89 year old male history of weakness. Possible sepsis. Currently undergoing chemotherapy. History of multiple myeloma. * Tracking Code: BO * EXAM: CT ABDOMEN AND PELVIS WITH CONTRAST TECHNIQUE: Multidetector CT imaging of the abdomen and pelvis was performed using the standard protocol following bolus administration of intravenous contrast. RADIATION DOSE REDUCTION: This exam was performed according to the departmental dose-optimization program which includes automated exposure control, adjustment of the mA and/or kV according to patient size and/or use of iterative reconstruction technique. CONTRAST:  80mL OMNIPAQUE IOHEXOL 300 MG/ML  SOLN  COMPARISON:  CT of the chest, abdomen and pelvis 01/16/2022. FINDINGS: Lower chest: In the periphery of the left lower lobe (axial image 9 of series 4) there is a new mass-like area measuring 2.6 x 2.0 cm. Some dependent ground-glass attenuation and focal volume loss and architectural distortion is noted in the surrounding lung parenchyma in the base of the left lower lobe. Atherosclerotic calcifications are noted in the descending thoracic aorta as well as the left main, left anterior descending, left circumflex and right coronary arteries. Hepatobiliary: Low-attenuation lesion which measures 3.7 cm in the left lobe between segments 2 and 3 (axial image 22 of series 2), compatible with a simple cyst (no imaging follow-up recommended). Other 12 mm intermediate attenuation (34 HU) lesion between segment 4A and 8 (axial image 17 of series 2), incompletely characterize, but stable compared to the prior study, presumably a benign lesion such as a mildly proteinaceous cyst. No other aggressive appearing hepatic lesions are noted. No intra or extrahepatic biliary ductal dilatation. Gallbladder is unremarkable in appearance. Pancreas: No pancreatic mass. No pancreatic ductal dilatation. No pancreatic or peripancreatic fluid collections or inflammatory changes. Spleen: Unremarkable. Adrenals/Urinary Tract: Nonobstructive calculi are noted within the collecting systems of both kidneys measuring up to 4 mm in the interpolar region of the right kidney. Mild cortical thinning in the interpolar region of the left kidney, presumably chronic post infectious or inflammatory scarring. Subcentimeter low-attenuation lesions in both kidneys, too small to definitively characterize, but statistically likely tiny cysts (no imaging follow-up recommended). No aggressive appearing renal lesions are noted. No hydroureteronephrosis. Urinary bladder is unremarkable in appearance. Bilateral adrenal glands are normal in appearance. Stomach/Bowel:  The appearance of the stomach is normal. No pathologic dilatation of small bowel or colon. Numerous colonic diverticula are noted, without surrounding inflammatory changes to indicate an acute diverticulitis at this time. Normal appendix. Vascular/Lymphatic: Atherosclerotic calcifications throughout the abdominal aorta and pelvic vasculature, without evidence of aneurysm or dissection. No lymphadenopathy noted in the abdomen or pelvis. Reproductive: Prostate gland and seminal vesicles are unremarkable in appearance. Other: Small right inguinal hernia containing only fat. No significant volume of ascites. No pneumoperitoneum. Musculoskeletal: There are no aggressive appearing lytic or blastic lesions noted in the visualized portions of the skeleton. IMPRESSION: 1. No acute findings are noted in the abdomen or pelvis to account for the patient's symptoms. 2. However, there is a new mass-like opacity in the base of the left lower lobe, with surrounding ground-glass attenuation in the adjacent left lower lobe. This is potentially of infectious or inflammatory etiology, however, underlying neoplasm is difficult to entirely exclude. Short-term follow-up noncontrast chest CT is suggested in 1 month after trial of antimicrobial therapy to ensure regression of this finding. 3. Colonic diverticulosis without evidence of acute diverticulitis at this time. 4. Nonobstructive calculi in the collecting systems of both  kidneys measuring up to 4 mm in the interpolar region of the right kidney. 5. Right inguinal hernia containing only fat. No evidence of associated bowel incarceration or obstruction at this time. 6. Aortic atherosclerosis, in addition to left main and three-vessel coronary artery disease. 7. Additional incidental findings, as above. Electronically Signed   By: Trudie Reed M.D.   On: 11/17/2022 05:18   DG Chest Portable 1 View  Result Date: 11/17/2022 CLINICAL DATA:  Possible sepsis EXAM: PORTABLE CHEST 1  VIEW COMPARISON:  10/07/2022 FINDINGS: Cardiac shadow is stable. Aortic calcifications are noted. Left chest wall port is again seen. Lungs are well aerated bilaterally. No focal infiltrate or sizable effusion is seen. Chronic scarring is again noted bilaterally. No bony abnormality is seen. IMPRESSION: Scarring without acute abnormality. Electronically Signed   By: Alcide Clever M.D.   On: 11/17/2022 03:34

## 2022-12-12 ENCOUNTER — Inpatient Hospital Stay (HOSPITAL_BASED_OUTPATIENT_CLINIC_OR_DEPARTMENT_OTHER): Payer: Medicare Other | Admitting: Hematology

## 2022-12-12 ENCOUNTER — Inpatient Hospital Stay: Payer: Medicare Other

## 2022-12-12 ENCOUNTER — Other Ambulatory Visit: Payer: Self-pay

## 2022-12-12 VITALS — BP 147/66 | HR 69 | Temp 97.4°F | Resp 18

## 2022-12-12 DIAGNOSIS — Z5112 Encounter for antineoplastic immunotherapy: Secondary | ICD-10-CM | POA: Diagnosis not present

## 2022-12-12 DIAGNOSIS — C9000 Multiple myeloma not having achieved remission: Secondary | ICD-10-CM

## 2022-12-12 DIAGNOSIS — D630 Anemia in neoplastic disease: Secondary | ICD-10-CM | POA: Diagnosis not present

## 2022-12-12 DIAGNOSIS — T451X5A Adverse effect of antineoplastic and immunosuppressive drugs, initial encounter: Secondary | ICD-10-CM | POA: Diagnosis not present

## 2022-12-12 DIAGNOSIS — D6481 Anemia due to antineoplastic chemotherapy: Secondary | ICD-10-CM | POA: Diagnosis not present

## 2022-12-12 DIAGNOSIS — Z79620 Long term (current) use of immunosuppressive biologic: Secondary | ICD-10-CM | POA: Diagnosis not present

## 2022-12-12 LAB — CBC WITH DIFFERENTIAL/PLATELET
Abs Immature Granulocytes: 0.01 10*3/uL (ref 0.00–0.07)
Basophils Absolute: 0 10*3/uL (ref 0.0–0.1)
Basophils Relative: 1 %
Eosinophils Absolute: 0.1 10*3/uL (ref 0.0–0.5)
Eosinophils Relative: 3 %
HCT: 29 % — ABNORMAL LOW (ref 39.0–52.0)
Hemoglobin: 9.5 g/dL — ABNORMAL LOW (ref 13.0–17.0)
Immature Granulocytes: 0 %
Lymphocytes Relative: 26 %
Lymphs Abs: 0.8 10*3/uL (ref 0.7–4.0)
MCH: 36.4 pg — ABNORMAL HIGH (ref 26.0–34.0)
MCHC: 32.8 g/dL (ref 30.0–36.0)
MCV: 111.1 fL — ABNORMAL HIGH (ref 80.0–100.0)
Monocytes Absolute: 0.6 10*3/uL (ref 0.1–1.0)
Monocytes Relative: 21 %
Neutro Abs: 1.5 10*3/uL — ABNORMAL LOW (ref 1.7–7.7)
Neutrophils Relative %: 49 %
Platelets: 143 10*3/uL — ABNORMAL LOW (ref 150–400)
RBC: 2.61 MIL/uL — ABNORMAL LOW (ref 4.22–5.81)
RDW: 16.6 % — ABNORMAL HIGH (ref 11.5–15.5)
WBC: 3.1 10*3/uL — ABNORMAL LOW (ref 4.0–10.5)
nRBC: 0 % (ref 0.0–0.2)

## 2022-12-12 LAB — COMPREHENSIVE METABOLIC PANEL
ALT: 26 U/L (ref 0–44)
AST: 21 U/L (ref 15–41)
Albumin: 2.9 g/dL — ABNORMAL LOW (ref 3.5–5.0)
Alkaline Phosphatase: 64 U/L (ref 38–126)
Anion gap: 6 (ref 5–15)
BUN: 18 mg/dL (ref 8–23)
CO2: 24 mmol/L (ref 22–32)
Calcium: 9 mg/dL (ref 8.9–10.3)
Chloride: 102 mmol/L (ref 98–111)
Creatinine, Ser: 0.88 mg/dL (ref 0.61–1.24)
GFR, Estimated: >60 mL/min (ref >60)
Glucose, Bld: 115 mg/dL — ABNORMAL HIGH (ref 70–99)
Potassium: 3.7 mmol/L (ref 3.5–5.1)
Sodium: 132 mmol/L — ABNORMAL LOW (ref 135–145)
Total Bilirubin: 0.4 mg/dL (ref 0.3–1.2)
Total Protein: 8.1 g/dL (ref 6.5–8.1)

## 2022-12-12 LAB — SAMPLE TO BLOOD BANK

## 2022-12-12 LAB — MAGNESIUM: Magnesium: 2 mg/dL (ref 1.7–2.4)

## 2022-12-12 MED ORDER — SODIUM CHLORIDE 0.9 % IV SOLN
16.0000 mg/kg | Freq: Once | INTRAVENOUS | Status: AC
  Administered 2022-12-12: 900 mg via INTRAVENOUS
  Filled 2022-12-12: qty 40

## 2022-12-12 MED ORDER — HEPARIN SOD (PORK) LOCK FLUSH 100 UNIT/ML IV SOLN
500.0000 [IU] | Freq: Once | INTRAVENOUS | Status: AC | PRN
  Administered 2022-12-12: 500 [IU]

## 2022-12-12 MED ORDER — SODIUM CHLORIDE 0.9% FLUSH
10.0000 mL | INTRAVENOUS | Status: DC | PRN
  Administered 2022-12-12: 10 mL

## 2022-12-12 MED ORDER — SODIUM CHLORIDE 0.9 % IV SOLN: Freq: Once | INTRAVENOUS | Status: AC

## 2022-12-12 MED ORDER — METHYLPREDNISOLONE SODIUM SUCC 125 MG IJ SOLR
100.0000 mg | Freq: Once | INTRAMUSCULAR | Status: AC
  Administered 2022-12-12: 100 mg via INTRAVENOUS
  Filled 2022-12-12: qty 2

## 2022-12-12 MED ORDER — CETIRIZINE HCL 10 MG/ML IV SOLN
10.0000 mg | Freq: Once | INTRAVENOUS | Status: AC
  Administered 2022-12-12: 10 mg via INTRAVENOUS
  Filled 2022-12-12: qty 1

## 2022-12-12 MED ORDER — BORTEZOMIB CHEMO SQ INJECTION 3.5 MG (2.5MG/ML)
1.0000 mg/m2 | Freq: Once | INTRAMUSCULAR | Status: AC
  Administered 2022-12-12: 1.75 mg via SUBCUTANEOUS
  Filled 2022-12-12: qty 0.7

## 2022-12-12 MED ORDER — ONDANSETRON HCL 4 MG/2ML IJ SOLN
8.0000 mg | Freq: Once | INTRAMUSCULAR | Status: AC
  Administered 2022-12-12: 8 mg via INTRAVENOUS
  Filled 2022-12-12: qty 4

## 2022-12-12 MED ORDER — ACETAMINOPHEN 325 MG PO TABS
650.0000 mg | ORAL_TABLET | Freq: Once | ORAL | Status: AC
  Administered 2022-12-12: 650 mg via ORAL
  Filled 2022-12-12: qty 2

## 2022-12-12 NOTE — Patient Instructions (Signed)
[facility]  Discharge Instructions: Thank you for choosing [facility] to provide your oncology and hematology care.  If you have a lab appointment with the Cancer Center - please note that after April 8th, 2024, all labs will be drawn in the cancer center.  You do not have to check in or register with the main entrance as you have in the past but will complete your check-in in the cancer center.  Wear comfortable clothing and clothing appropriate for easy access to any Portacath or PICC line.   We strive to give you quality time with your provider. You may need to reschedule your appointment if you arrive late (15 or more minutes).  Arriving late affects you and other patients whose appointments are after yours.  Also, if you miss three or more appointments without notifying the office, you may be dismissed from the clinic at the provider's discretion.      For prescription refill requests, have your pharmacy contact our office and allow 72 hours for refills to be completed.    Today you received the following chemotherapy and/or immunotherapy agents Daratumumab/Velcade      To help prevent nausea and vomiting after your treatment, we encourage you to take your nausea medication as directed.  BELOW ARE SYMPTOMS THAT SHOULD BE REPORTED IMMEDIATELY: *FEVER GREATER THAN 100.4 F (38 C) OR HIGHER *CHILLS OR SWEATING *NAUSEA AND VOMITING THAT IS NOT CONTROLLED WITH YOUR NAUSEA MEDICATION *UNUSUAL SHORTNESS OF BREATH *UNUSUAL BRUISING OR BLEEDING *URINARY PROBLEMS (pain or burning when urinating, or frequent urination) *BOWEL PROBLEMS (unusual diarrhea, constipation, pain near the anus) TENDERNESS IN MOUTH AND THROAT WITH OR WITHOUT PRESENCE OF ULCERS (sore throat, sores in mouth, or a toothache) UNUSUAL RASH, SWELLING OR PAIN  UNUSUAL VAGINAL DISCHARGE OR ITCHING   Items with * indicate a potential emergency and should be followed up as soon as possible or  go to the Emergency Department if any problems should occur.  Please show the CHEMOTHERAPY ALERT CARD or IMMUNOTHERAPY ALERT CARD at check-in to the Emergency Department and triage nurse.  Should you have questions after your visit or need to cancel or reschedule your appointment, please contact [facility] [phone]  and follow the prompts.  Office hours are 8:00 a.m. to 4:30 p.m. Monday - Friday. Please note that voicemails left after 4:00 p.m. may not be returned until the following business day.  We are closed weekends and major holidays. You have access to a nurse at all times for urgent questions. Please call the main number to the clinic [phone] and follow the prompts.  For any non-urgent questions, you may also contact your provider using MyChart. We now offer e-Visits for anyone 11 and older to request care online for non-urgent symptoms. For details visit [email or web address].   Also download the MyChart app! Go to the app store, search "MyChart", open the app, select [place], and log in with your MyChart username and password.

## 2022-12-12 NOTE — Progress Notes (Signed)
Patient presents today for Rapid Daratumumab infusion and Velcade injection per providers order.  Vital signs and lab reviewed by MD.  Message received from Chapman Moss RN/Dr. Ellin Saba patient okay for treatment.  Treatment given today per MD orders.  Stable during infusion without adverse affects.  Vital signs stable.  Injection site WNL; see MAR for injection details.  Patient tolerated procedure well and without incident.  No complaints at this time.  Discharge from clinic via wheelchair in stable condition.  Alert and oriented X 3.  Follow up with [facility] as scheduled.

## 2022-12-12 NOTE — Progress Notes (Signed)
Patient has been examined by Dr. Katragadda. Vital signs and labs have been reviewed by MD - ANC, Creatinine, LFTs, hemoglobin, and platelets are within treatment parameters per M.D. - pt may proceed with treatment.  Primary RN and pharmacy notified.  

## 2022-12-12 NOTE — Patient Instructions (Signed)
[facility] at [facility] Discharge Instructions   You were seen and examined today by Dr. Katragadda.  He reviewed the results of your lab work which are normal/stable.   We will proceed with your treatment today.  Return as scheduled.    Thank you for choosing [facility] at [facility] to provide your oncology and hematology care.  To afford each patient quality time with our provider, please arrive at least 15 minutes before your scheduled appointment time.   If you have a lab appointment with the Cancer Center please come in thru the Main Entrance and check in at the main information desk.  You need to re-schedule your appointment should you arrive 10 or more minutes late.  We strive to give you quality time with our providers, and arriving late affects you and other patients whose appointments are after yours.  Also, if you no show three or more times for appointments you may be dismissed from the clinic at the providers discretion.     Again, thank you for choosing [facility].  Our hope is that these requests will decrease the amount of time that you wait before being seen by our physicians.       _____________________________________________________________  Should you have questions after your visit to [facility], please contact our office at [phone] and follow the prompts.  Our office hours are 8:00 a.m. and 4:30 p.m. Monday - Friday.  Please note that voicemails left after 4:00 p.m. may not be returned until the following business day.  We are closed weekends and major holidays.  You do have access to a nurse 24-7, just call the main number to the clinic [phone] and do not press any options, hold on the line and a nurse will answer the phone.    For prescription refill requests, have your pharmacy contact our office and allow 72 hours.    Due to Covid, you will need to wear a mask upon entering  the hospital. If you do not have a mask, a mask will be given to you at the Main Entrance upon arrival. For doctor visits, patients may have 1 support person age 18 or older with them. For treatment visits, patients can not have anyone with them due to social distancing guidelines and our immunocompromised population.      

## 2022-12-13 ENCOUNTER — Other Ambulatory Visit: Payer: Self-pay

## 2022-12-15 ENCOUNTER — Encounter (HOSPITAL_COMMUNITY): Payer: Self-pay | Admitting: Internal Medicine

## 2022-12-15 ENCOUNTER — Emergency Department (HOSPITAL_COMMUNITY): Payer: Medicare Other

## 2022-12-15 ENCOUNTER — Other Ambulatory Visit: Payer: Self-pay

## 2022-12-15 ENCOUNTER — Inpatient Hospital Stay (HOSPITAL_COMMUNITY)
Admission: EM | Admit: 2022-12-15 | Discharge: 2022-12-23 | DRG: 314 | Disposition: A | Discharge status: Skilled Nursing Facility | Payer: Medicare Other | Attending: Internal Medicine | Admitting: Internal Medicine

## 2022-12-15 DIAGNOSIS — I110 Hypertensive heart disease with heart failure: Secondary | ICD-10-CM | POA: Diagnosis present

## 2022-12-15 DIAGNOSIS — D61818 Other pancytopenia: Secondary | ICD-10-CM | POA: Diagnosis present

## 2022-12-15 DIAGNOSIS — M009 Pyogenic arthritis, unspecified: Secondary | ICD-10-CM | POA: Diagnosis not present

## 2022-12-15 DIAGNOSIS — G9341 Metabolic encephalopathy: Secondary | ICD-10-CM | POA: Diagnosis not present

## 2022-12-15 DIAGNOSIS — S8992XA Unspecified injury of left lower leg, initial encounter: Secondary | ICD-10-CM | POA: Diagnosis not present

## 2022-12-15 DIAGNOSIS — E782 Mixed hyperlipidemia: Secondary | ICD-10-CM | POA: Diagnosis present

## 2022-12-15 DIAGNOSIS — I2510 Atherosclerotic heart disease of native coronary artery without angina pectoris: Secondary | ICD-10-CM | POA: Diagnosis present

## 2022-12-15 DIAGNOSIS — Y838 Other surgical procedures as the cause of abnormal reaction of the patient, or of later complication, without mention of misadventure at the time of the procedure: Secondary | ICD-10-CM | POA: Diagnosis present

## 2022-12-15 DIAGNOSIS — S299XXA Unspecified injury of thorax, initial encounter: Secondary | ICD-10-CM | POA: Diagnosis not present

## 2022-12-15 DIAGNOSIS — D696 Thrombocytopenia, unspecified: Secondary | ICD-10-CM | POA: Diagnosis present

## 2022-12-15 DIAGNOSIS — H409 Unspecified glaucoma: Secondary | ICD-10-CM | POA: Diagnosis present

## 2022-12-15 DIAGNOSIS — B9689 Other specified bacterial agents as the cause of diseases classified elsewhere: Secondary | ICD-10-CM | POA: Diagnosis present

## 2022-12-15 DIAGNOSIS — T451X5A Adverse effect of antineoplastic and immunosuppressive drugs, initial encounter: Secondary | ICD-10-CM | POA: Diagnosis present

## 2022-12-15 DIAGNOSIS — E039 Hypothyroidism, unspecified: Secondary | ICD-10-CM | POA: Diagnosis present

## 2022-12-15 DIAGNOSIS — I700 Atherosclerosis of aorta: Secondary | ICD-10-CM | POA: Diagnosis not present

## 2022-12-15 DIAGNOSIS — Z87891 Personal history of nicotine dependence: Secondary | ICD-10-CM

## 2022-12-15 DIAGNOSIS — M19071 Primary osteoarthritis, right ankle and foot: Secondary | ICD-10-CM | POA: Diagnosis not present

## 2022-12-15 DIAGNOSIS — Z885 Allergy status to narcotic agent status: Secondary | ICD-10-CM

## 2022-12-15 DIAGNOSIS — C9000 Multiple myeloma not having achieved remission: Secondary | ICD-10-CM | POA: Diagnosis not present

## 2022-12-15 DIAGNOSIS — I10 Essential (primary) hypertension: Secondary | ICD-10-CM | POA: Diagnosis present

## 2022-12-15 DIAGNOSIS — I6782 Cerebral ischemia: Secondary | ICD-10-CM | POA: Diagnosis not present

## 2022-12-15 DIAGNOSIS — Z862 Personal history of diseases of the blood and blood-forming organs and certain disorders involving the immune mechanism: Secondary | ICD-10-CM

## 2022-12-15 DIAGNOSIS — S99911A Unspecified injury of right ankle, initial encounter: Secondary | ICD-10-CM | POA: Diagnosis not present

## 2022-12-15 DIAGNOSIS — R41841 Cognitive communication deficit: Secondary | ICD-10-CM | POA: Diagnosis present

## 2022-12-15 DIAGNOSIS — Z79899 Other long term (current) drug therapy: Secondary | ICD-10-CM

## 2022-12-15 DIAGNOSIS — Z882 Allergy status to sulfonamides status: Secondary | ICD-10-CM

## 2022-12-15 DIAGNOSIS — N400 Enlarged prostate without lower urinary tract symptoms: Secondary | ICD-10-CM | POA: Diagnosis present

## 2022-12-15 DIAGNOSIS — R7881 Bacteremia: Secondary | ICD-10-CM | POA: Diagnosis present

## 2022-12-15 DIAGNOSIS — Z66 Do not resuscitate: Secondary | ICD-10-CM | POA: Diagnosis present

## 2022-12-15 DIAGNOSIS — E871 Hypo-osmolality and hyponatremia: Secondary | ICD-10-CM | POA: Diagnosis not present

## 2022-12-15 DIAGNOSIS — L03116 Cellulitis of left lower limb: Secondary | ICD-10-CM | POA: Diagnosis present

## 2022-12-15 DIAGNOSIS — S99912A Unspecified injury of left ankle, initial encounter: Secondary | ICD-10-CM | POA: Diagnosis not present

## 2022-12-15 DIAGNOSIS — Z820 Family history of epilepsy and other diseases of the nervous system: Secondary | ICD-10-CM

## 2022-12-15 DIAGNOSIS — D860 Sarcoidosis of lung: Secondary | ICD-10-CM | POA: Diagnosis present

## 2022-12-15 DIAGNOSIS — I509 Heart failure, unspecified: Secondary | ICD-10-CM | POA: Diagnosis present

## 2022-12-15 DIAGNOSIS — G9340 Encephalopathy, unspecified: Secondary | ICD-10-CM | POA: Diagnosis not present

## 2022-12-15 DIAGNOSIS — D84821 Immunodeficiency due to drugs: Secondary | ICD-10-CM | POA: Diagnosis present

## 2022-12-15 DIAGNOSIS — D693 Immune thrombocytopenic purpura: Secondary | ICD-10-CM | POA: Diagnosis present

## 2022-12-15 DIAGNOSIS — J45909 Unspecified asthma, uncomplicated: Secondary | ICD-10-CM | POA: Diagnosis present

## 2022-12-15 DIAGNOSIS — E7800 Pure hypercholesterolemia, unspecified: Secondary | ICD-10-CM | POA: Diagnosis present

## 2022-12-15 DIAGNOSIS — E222 Syndrome of inappropriate secretion of antidiuretic hormone: Secondary | ICD-10-CM | POA: Diagnosis present

## 2022-12-15 DIAGNOSIS — D6481 Anemia due to antineoplastic chemotherapy: Secondary | ICD-10-CM | POA: Diagnosis present

## 2022-12-15 DIAGNOSIS — E43 Unspecified severe protein-calorie malnutrition: Secondary | ICD-10-CM | POA: Diagnosis present

## 2022-12-15 DIAGNOSIS — Z741 Need for assistance with personal care: Secondary | ICD-10-CM | POA: Diagnosis present

## 2022-12-15 DIAGNOSIS — R079 Chest pain, unspecified: Secondary | ICD-10-CM | POA: Diagnosis not present

## 2022-12-15 DIAGNOSIS — F0390 Unspecified dementia without behavioral disturbance: Secondary | ICD-10-CM | POA: Diagnosis present

## 2022-12-15 DIAGNOSIS — R1310 Dysphagia, unspecified: Secondary | ICD-10-CM | POA: Diagnosis present

## 2022-12-15 DIAGNOSIS — R0902 Hypoxemia: Secondary | ICD-10-CM | POA: Diagnosis not present

## 2022-12-15 DIAGNOSIS — K219 Gastro-esophageal reflux disease without esophagitis: Secondary | ICD-10-CM | POA: Diagnosis present

## 2022-12-15 DIAGNOSIS — D539 Nutritional anemia, unspecified: Secondary | ICD-10-CM | POA: Diagnosis present

## 2022-12-15 DIAGNOSIS — K5730 Diverticulosis of large intestine without perforation or abscess without bleeding: Secondary | ICD-10-CM | POA: Diagnosis not present

## 2022-12-15 DIAGNOSIS — Z809 Family history of malignant neoplasm, unspecified: Secondary | ICD-10-CM

## 2022-12-15 DIAGNOSIS — D638 Anemia in other chronic diseases classified elsewhere: Secondary | ICD-10-CM | POA: Diagnosis present

## 2022-12-15 DIAGNOSIS — Z823 Family history of stroke: Secondary | ICD-10-CM

## 2022-12-15 DIAGNOSIS — T80211A Bloodstream infection due to central venous catheter, initial encounter: Principal | ICD-10-CM | POA: Diagnosis present

## 2022-12-15 DIAGNOSIS — M6281 Muscle weakness (generalized): Secondary | ICD-10-CM | POA: Diagnosis present

## 2022-12-15 DIAGNOSIS — R601 Generalized edema: Secondary | ICD-10-CM | POA: Diagnosis not present

## 2022-12-15 DIAGNOSIS — Z881 Allergy status to other antibiotic agents status: Secondary | ICD-10-CM

## 2022-12-15 DIAGNOSIS — R000 Tachycardia, unspecified: Secondary | ICD-10-CM | POA: Diagnosis not present

## 2022-12-15 DIAGNOSIS — M25572 Pain in left ankle and joints of left foot: Secondary | ICD-10-CM | POA: Diagnosis not present

## 2022-12-15 DIAGNOSIS — Z888 Allergy status to other drugs, medicaments and biological substances status: Secondary | ICD-10-CM

## 2022-12-15 DIAGNOSIS — Z471 Aftercare following joint replacement surgery: Secondary | ICD-10-CM | POA: Diagnosis present

## 2022-12-15 DIAGNOSIS — R278 Other lack of coordination: Secondary | ICD-10-CM | POA: Diagnosis present

## 2022-12-15 DIAGNOSIS — R4182 Altered mental status, unspecified: Secondary | ICD-10-CM | POA: Diagnosis not present

## 2022-12-15 DIAGNOSIS — Z955 Presence of coronary angioplasty implant and graft: Secondary | ICD-10-CM

## 2022-12-15 DIAGNOSIS — K4090 Unilateral inguinal hernia, without obstruction or gangrene, not specified as recurrent: Secondary | ICD-10-CM | POA: Diagnosis not present

## 2022-12-15 DIAGNOSIS — Z8249 Family history of ischemic heart disease and other diseases of the circulatory system: Secondary | ICD-10-CM

## 2022-12-15 DIAGNOSIS — R918 Other nonspecific abnormal finding of lung field: Secondary | ICD-10-CM | POA: Diagnosis not present

## 2022-12-15 DIAGNOSIS — Z7982 Long term (current) use of aspirin: Secondary | ICD-10-CM

## 2022-12-15 DIAGNOSIS — M79605 Pain in left leg: Secondary | ICD-10-CM | POA: Diagnosis not present

## 2022-12-15 DIAGNOSIS — H3530 Unspecified macular degeneration: Secondary | ICD-10-CM | POA: Diagnosis present

## 2022-12-15 DIAGNOSIS — M19072 Primary osteoarthritis, left ankle and foot: Secondary | ICD-10-CM | POA: Diagnosis not present

## 2022-12-15 LAB — CBC WITH DIFFERENTIAL/PLATELET
Abs Immature Granulocytes: 0.02 10*3/uL (ref 0.00–0.07)
Basophils Absolute: 0 10*3/uL (ref 0.0–0.1)
Basophils Relative: 0 %
Eosinophils Absolute: 0 10*3/uL (ref 0.0–0.5)
Eosinophils Relative: 0 %
HCT: 32.7 % — ABNORMAL LOW (ref 39.0–52.0)
Hemoglobin: 10.9 g/dL — ABNORMAL LOW (ref 13.0–17.0)
Immature Granulocytes: 0 %
Lymphocytes Relative: 7 %
Lymphs Abs: 0.5 10*3/uL — ABNORMAL LOW (ref 0.7–4.0)
MCH: 37.5 pg — ABNORMAL HIGH (ref 26.0–34.0)
MCHC: 33.3 g/dL (ref 30.0–36.0)
MCV: 112.4 fL — ABNORMAL HIGH (ref 80.0–100.0)
Monocytes Absolute: 0.4 10*3/uL (ref 0.1–1.0)
Monocytes Relative: 6 %
Neutro Abs: 6.2 10*3/uL (ref 1.7–7.7)
Neutrophils Relative %: 87 %
Platelets: 81 10*3/uL — ABNORMAL LOW (ref 150–400)
RBC: 2.91 MIL/uL — ABNORMAL LOW (ref 4.22–5.81)
RDW: 15.9 % — ABNORMAL HIGH (ref 11.5–15.5)
WBC: 7.2 10*3/uL (ref 4.0–10.5)
nRBC: 0 % (ref 0.0–0.2)

## 2022-12-15 LAB — PROCALCITONIN: Procalcitonin: 0.42 ng/mL

## 2022-12-15 LAB — COMPREHENSIVE METABOLIC PANEL
ALT: 37 U/L (ref 0–44)
AST: 24 U/L (ref 15–41)
Albumin: 3.2 g/dL — ABNORMAL LOW (ref 3.5–5.0)
Alkaline Phosphatase: 69 U/L (ref 38–126)
Anion gap: 6 (ref 5–15)
BUN: 24 mg/dL — ABNORMAL HIGH (ref 8–23)
CO2: 26 mmol/L (ref 22–32)
Calcium: 9.1 mg/dL (ref 8.9–10.3)
Chloride: 100 mmol/L (ref 98–111)
Creatinine, Ser: 0.87 mg/dL (ref 0.61–1.24)
GFR, Estimated: >60 mL/min (ref >60)
Glucose, Bld: 112 mg/dL — ABNORMAL HIGH (ref 70–99)
Potassium: 3.5 mmol/L (ref 3.5–5.1)
Sodium: 132 mmol/L — ABNORMAL LOW (ref 135–145)
Total Bilirubin: 0.8 mg/dL (ref 0.3–1.2)
Total Protein: 8.8 g/dL — ABNORMAL HIGH (ref 6.5–8.1)

## 2022-12-15 LAB — LACTIC ACID, PLASMA
Lactic Acid, Venous: 1.9 mmol/L (ref 0.5–1.9)
Lactic Acid, Venous: 2.3 mmol/L (ref 0.5–1.9)

## 2022-12-15 LAB — C-REACTIVE PROTEIN: CRP: 0.7 mg/dL (ref <1.0)

## 2022-12-15 LAB — CULTURE, BLOOD (ROUTINE X 2)
Special Requests: ADEQUATE
Special Requests: ADEQUATE

## 2022-12-15 LAB — SEDIMENTATION RATE: Sed Rate: 70 mm/hr — ABNORMAL HIGH (ref 0–16)

## 2022-12-15 MED ORDER — ACETAMINOPHEN 325 MG PO TABS
650.0000 mg | ORAL_TABLET | Freq: Four times a day (QID) | ORAL | Status: DC | PRN
  Administered 2022-12-16 – 2022-12-17 (×3): 650 mg via ORAL
  Filled 2022-12-15 (×3): qty 2

## 2022-12-15 MED ORDER — ONDANSETRON HCL 4 MG/2ML IJ SOLN: 4.0000 mg | Freq: Four times a day (QID) | INTRAMUSCULAR | Status: DC | PRN

## 2022-12-15 MED ORDER — PANTOPRAZOLE SODIUM 40 MG PO TBEC
40.0000 mg | DELAYED_RELEASE_TABLET | Freq: Every day | ORAL | Status: DC
  Administered 2022-12-16 – 2022-12-23 (×8): 40 mg via ORAL
  Filled 2022-12-15 (×8): qty 1

## 2022-12-15 MED ORDER — SODIUM CHLORIDE 0.9 % IV SOLN
1.0000 g | Freq: Once | INTRAVENOUS | Status: AC
  Administered 2022-12-15: 1 g via INTRAVENOUS
  Filled 2022-12-15: qty 10

## 2022-12-15 MED ORDER — ATORVASTATIN CALCIUM 40 MG PO TABS
40.0000 mg | ORAL_TABLET | Freq: Every day | ORAL | Status: DC
  Administered 2022-12-16 – 2022-12-23 (×8): 40 mg via ORAL
  Filled 2022-12-15 (×8): qty 1

## 2022-12-15 MED ORDER — IOHEXOL 300 MG/ML  SOLN
100.0000 mL | Freq: Once | INTRAMUSCULAR | Status: AC | PRN
  Administered 2022-12-15: 100 mL via INTRAVENOUS

## 2022-12-15 MED ORDER — CEFAZOLIN SODIUM-DEXTROSE 1-4 GM/50ML-% IV SOLN
1.0000 g | Freq: Three times a day (TID) | INTRAVENOUS | Status: DC
  Administered 2022-12-15 – 2022-12-16 (×4): 1 g via INTRAVENOUS
  Filled 2022-12-15 (×9): qty 50

## 2022-12-15 MED ORDER — LEVOTHYROXINE SODIUM 75 MCG PO TABS
75.0000 ug | ORAL_TABLET | Freq: Every day | ORAL | Status: DC
  Administered 2022-12-16 – 2022-12-23 (×7): 75 ug via ORAL
  Filled 2022-12-15 (×8): qty 1

## 2022-12-15 MED ORDER — ONDANSETRON HCL 4 MG/2ML IJ SOLN
4.0000 mg | Freq: Once | INTRAMUSCULAR | Status: DC
  Filled 2022-12-15: qty 2

## 2022-12-15 MED ORDER — ONDANSETRON HCL 4 MG PO TABS: 4.0000 mg | ORAL_TABLET | Freq: Four times a day (QID) | ORAL | Status: DC | PRN

## 2022-12-15 MED ORDER — ACYCLOVIR 800 MG PO TABS
400.0000 mg | ORAL_TABLET | Freq: Two times a day (BID) | ORAL | Status: DC
  Administered 2022-12-15 – 2022-12-23 (×16): 400 mg via ORAL
  Filled 2022-12-15 (×16): qty 1

## 2022-12-15 MED ORDER — POTASSIUM CHLORIDE IN NACL 40-0.9 MEQ/L-% IV SOLN: INTRAVENOUS | Status: DC

## 2022-12-15 MED ORDER — ASPIRIN 81 MG PO TBEC
81.0000 mg | DELAYED_RELEASE_TABLET | Freq: Every day | ORAL | Status: DC
  Administered 2022-12-16 – 2022-12-23 (×7): 81 mg via ORAL
  Filled 2022-12-15 (×8): qty 1

## 2022-12-15 MED ORDER — ISOSORBIDE MONONITRATE ER 60 MG PO TB24
60.0000 mg | ORAL_TABLET | Freq: Two times a day (BID) | ORAL | Status: DC
  Administered 2022-12-15 – 2022-12-23 (×16): 60 mg via ORAL
  Filled 2022-12-15 (×17): qty 1

## 2022-12-15 MED ORDER — ACETAMINOPHEN 650 MG RE SUPP: 650.0000 mg | Freq: Four times a day (QID) | RECTAL | Status: DC | PRN

## 2022-12-15 MED ORDER — LACTATED RINGERS IV BOLUS
500.0000 mL | Freq: Once | INTRAVENOUS | Status: AC
  Administered 2022-12-15: 500 mL via INTRAVENOUS

## 2022-12-15 MED ORDER — METOPROLOL TARTRATE 25 MG PO TABS
25.0000 mg | ORAL_TABLET | Freq: Two times a day (BID) | ORAL | Status: DC
  Administered 2022-12-15 – 2022-12-23 (×16): 25 mg via ORAL
  Filled 2022-12-15 (×16): qty 1

## 2022-12-15 MED ORDER — ACETAMINOPHEN 500 MG PO TABS
1000.0000 mg | ORAL_TABLET | Freq: Once | ORAL | Status: AC
  Administered 2022-12-15: 1000 mg via ORAL
  Filled 2022-12-15: qty 2

## 2022-12-15 NOTE — Assessment & Plan Note (Signed)
Continue with home HTN meds.

## 2022-12-15 NOTE — Assessment & Plan Note (Signed)
Stable

## 2022-12-15 NOTE — ED Provider Notes (Signed)
[place] EMERGENCY DEPARTMENT AT [facility] Provider Note   CSN: [id number] Arrival date & time: 12/15/22  1104     History  Chief Complaint  Patient presents with   Ankle Pain    Dale [name] is a 89 y.o. male.  Level 5 caveat secondary to dementia.  Patient brought in by ambulance from home for pain in his left lower leg.  Patient cannot tell me anything about it.  Awaiting family arrival to see if there is more information.  Has a history of multiple myeloma and is on chemotherapeutic agents, follows with Dr. Ellin Saba.  HPI     Home Medications Prior to Admission medications   Medication Sig Start Date End Date Taking? Authorizing Provider  acetaminophen (TYLENOL) 325 MG tablet Take 2 tablets (650 mg total) by mouth every 6 (six) hours as needed for mild pain (or Fever >/= 101). 01/17/22   Vassie Loll, MD  acyclovir (ZOVIRAX) 400 MG tablet TAKE 1 TABLET BY MOUTH TWICE  DAILY 12/02/22   Doreatha Massed, MD  Ascorbic Acid (VITAMIN C PO) Take 500 mg by mouth every evening.    [provider]  aspirin EC 81 MG tablet Take 1 tablet (81 mg total) by mouth daily with breakfast. For 30 days Only Patient taking differently: Take 81 mg by mouth daily with breakfast. Every Monday, Wednesday, and Friday 06/04/22 06/04/23  Shon Hale, MD  atorvastatin (LIPITOR) 40 MG tablet TAKE 1 TABLET BY MOUTH IN THE  EVENING 09/06/22   Lennette Bihari, MD  Bortezomib (VELCADE IJ) Inject as directed once a week.    [provider]  brimonidine (ALPHAGAN) 0.2 % ophthalmic solution Place 1 drop into the left eye 2 (two) times daily.    [provider]  cyanocobalamin 1000 MCG tablet Take 1 tablet (1,000 mcg total) by mouth daily. 01/18/22   Vassie Loll, MD  dexamethasone (DECADRON) 2 MG tablet TAKE 5 TABLETS BY MOUTH ONCE  WEEKLY 12/02/22   Doreatha Massed, MD  dorzolamide-timolol (COSOPT) 22.3-6.8 MG/ML ophthalmic solution Place 1 drop into the left  eye 2 (two) times daily.    [provider]  feeding supplement (ENSURE ENLIVE / ENSURE PLUS) LIQD Take 237 mLs by mouth 3 (three) times daily between meals. 01/17/22   Vassie Loll, MD  furosemide (LASIX) 20 MG tablet TAKE 2 TABLETS BY MOUTH DAILY, IF YOU NOTICE ANY SWELLING. 08/01/22   Lennette Bihari, MD  isosorbide mononitrate (IMDUR) 60 MG 24 hr tablet TAKE 1 AND 1/2 TABLETS BY MOUTH  IN THE MORNING AND ONE-HALF  TABLET BY MOUTH AT NIGHT 08/20/22   Lennette Bihari, MD  levothyroxine (SYNTHROID) 75 MCG tablet Take 75 mcg by mouth daily before breakfast. 07/16/21   [provider]  lidocaine-prilocaine (EMLA) cream Apply 1 Application topically as needed. 12/20/21   Doreatha Massed, MD  losartan (COZAAR) 100 MG tablet TAKE 1 TABLET BY MOUTH DAILY Patient taking differently: Take 100 mg by mouth daily. 04/10/22   Lennette Bihari, MD  metoprolol tartrate (LOPRESSOR) 25 MG tablet TAKE 1 TABLET BY MOUTH IN THE  MORNING AND ONE-HALF TABLET BY  MOUTH IN THE EVENING 08/23/22   Lennette Bihari, MD  Multiple Vitamins-Minerals (PRESERVISION/LUTEIN) CAPS Take 1 capsule by mouth 2 (two) times daily.    [provider]  nitroGLYCERIN (NITROSTAT) 0.4 MG SL tablet Place 1 tablet (0.4 mg total) under the tongue every 5 (five) minutes as needed. For chest pain 02/15/22   Tresa Endo,  Clovis Pu, MD  pantoprazole (PROTONIX) 40 MG tablet TAKE 1 TABLET BY MOUTH DAILY  BEFORE BREAKFAST Patient taking differently: Take 40 mg by mouth daily. 01/28/22   Aida Raider, NP  potassium chloride SA (KLOR-CON M) 20 MEQ tablet Take 20 mEq by mouth daily. 06/22/22   [provider]  prochlorperazine (COMPAZINE) 10 MG tablet Take 1 tablet (10 mg total) by mouth every 6 (six) hours as needed for nausea or vomiting. 12/20/21   Doreatha Massed, MD  ROCKLATAN 0.02-0.005 % SOLN Place 1 drop into the left eye at bedtime. 12/21/19   [provider]  triamcinolone (KENALOG) 0.1 % cream Apply 1  application. topically 2 (two) times daily as needed (for irritation).    [provider]  vitamin E 200 UNIT capsule Take 200 Units by mouth every evening.    [provider]      Allergies    Azithromycin, Doxazosin, Atenolol, Hydrocodone, Levofloxacin, Morphine, Penicillins, and Sulfonamide derivatives    Review of Systems   Review of Systems  Physical Exam Updated Vital Signs BP (!) 209/89   Pulse (!) 111   Temp 98.3 F (36.8 C) (Oral)   Resp 17   Ht 5\' 11"  (1.803 m)   Wt 57.2 kg   SpO2 98%   BMI 17.60 kg/m  Physical Exam Vitals and nursing note reviewed.  Constitutional:      General: He is not in acute distress.    Appearance: Normal appearance. He is well-developed.  HENT:     Head: Normocephalic and atraumatic.  Eyes:     Conjunctiva/sclera: Conjunctivae normal.  Cardiovascular:     Rate and Rhythm: Normal rate and regular rhythm.     Heart sounds: No murmur heard. Pulmonary:     Effort: Pulmonary effort is normal. No respiratory distress.     Breath sounds: Normal breath sounds.  Abdominal:     Palpations: Abdomen is soft.     Tenderness: There is no abdominal tenderness. There is no guarding or rebound.  Musculoskeletal:        General: Tenderness present. No swelling.     Cervical back: Neck supple.     Right lower leg: Edema present.     Left lower leg: Edema present.     Comments: Both of patient's ankles are swollen.  His left lower leg has got some erythema to it about halfway up his leg and some diffuse tenderness with palpation.  Distal pulses and motor are intact.  No cords appreciated.  No open wounds.  Skin:    General: Skin is warm and dry.     Capillary Refill: Capillary refill takes less than 2 seconds.  Neurological:     General: No focal deficit present.     Mental Status: He is alert.     Motor: No weakness.     ED Results / Procedures / Treatments   Labs (all labs ordered are listed, but only abnormal results are  displayed) Labs Reviewed  COMPREHENSIVE METABOLIC PANEL - Abnormal; Notable for the following components:      Result Value   Sodium 132 (*)    Glucose, Bld 112 (*)    BUN 24 (*)    Total Protein 8.8 (*)    Albumin 3.2 (*)    All other components within normal limits  CBC WITH DIFFERENTIAL/PLATELET - Abnormal; Notable for the following components:   RBC 2.91 (*)    Hemoglobin 10.9 (*)    HCT 32.7 (*)  MCV 112.4 (*)    MCH 37.5 (*)    RDW 15.9 (*)    Platelets 81 (*)    Lymphs Abs 0.5 (*)    All other components within normal limits  LACTIC ACID, PLASMA - Abnormal; Notable for the following components:   Lactic Acid, Venous 2.3 (*)    All other components within normal limits  SEDIMENTATION RATE - Abnormal; Notable for the following components:   Sed Rate 70 (*)    All other components within normal limits  CULTURE, BLOOD (ROUTINE X 2)  CULTURE, BLOOD (ROUTINE X 2)  MRSA NEXT GEN BY PCR, NASAL  LACTIC ACID, PLASMA  PROCALCITONIN  URINALYSIS, W/ REFLEX TO CULTURE (INFECTION SUSPECTED)  C-REACTIVE PROTEIN    EKG EKG Interpretation Date/Time:  Sunday December 15 2022 12:51:25 EDT Ventricular Rate:  112 PR Interval:  204 QRS Duration:  96 QT Interval:  317 QTC Calculation: 433 R Axis:   62  Text Interpretation: Sinus tachycardia nonspecific changes from prior 7/24 Confirmed by Meridee Score [phone]) on 12/15/2022 1:04:07 PM  Radiology CT ABDOMEN PELVIS W CONTRAST  Result Date: 12/15/2022 CLINICAL DATA:  Abdominal pain, acute, nonlocalized EXAM: CT ABDOMEN AND PELVIS WITH CONTRAST TECHNIQUE: Multidetector CT imaging of the abdomen and pelvis was performed using the standard protocol following bolus administration of intravenous contrast. RADIATION DOSE REDUCTION: This exam was performed according to the departmental dose-optimization program which includes automated exposure control, adjustment of the mA and/or kV according to patient size and/or use of iterative  reconstruction technique. CONTRAST:  OMNIPAQUE IOHEXOL 300 MG/ML  SOLN COMPARISON:  11/17/2022 FINDINGS: Lower chest: Mass-like opacity at the periphery of the left lower lobe measures approximately 4.6 x 1.3 x 2.6 cm, previously measured approximately 3.9 x 2.0 x 2.6 cm. Mild adjacent ground-glass opacity has slightly improved. Heart size is normal. Coronary artery atherosclerosis. Hepatobiliary: Unchanged appearance of the liver including prominent left hepatic lobe cyst. No new focal liver abnormality. Unremarkable gallbladder. No hyperdense gallstone. Similar mild biliary dilatation. Pancreas: Unremarkable. No pancreatic ductal dilatation or surrounding inflammatory changes. Spleen: Normal in size without focal abnormality. Adrenals/Urinary Tract: Unremarkable adrenal glands. Kidneys enhance symmetrically without focal lesion, stone, or hydronephrosis. Ureters are nondilated. There is a anterior urinary bladder wall diverticulum. Stomach/Bowel: Stomach within normal limits. No abnormally dilated loops of bowel. Mild wall thickening of the proximal to mid transverse colon. Extensive sigmoid diverticulosis. Moderate volume of stool within the colon. Vascular/Lymphatic: Aortic atherosclerosis. No enlarged abdominal or pelvic lymph nodes. Reproductive: Prostate is unremarkable. Other: No free fluid. No abdominopelvic fluid collection. No pneumoperitoneum. Small fat containing right inguinal hernia. Musculoskeletal: No acute or significant osseous findings. IMPRESSION: 1. Mild wall thickening of the proximal to mid transverse colon, which may be secondary to underdistention or mild colitis. 2. Extensive sigmoid diverticulosis without evidence of acute diverticulitis. 3. Mass-like opacity at the periphery of the left lower lobe measures approximately 4.6 x 1.3 x 2.6 cm, previously measured approximately 3.9 x 2.0 x 2.6 cm. Consider one of the following in 3 months for both low-risk and high-risk individuals: (a)  repeat chest CT, (b) follow-up PET-CT, or (c) tissue sampling. This recommendation follows the consensus statement: Guidelines for Management of Incidental Pulmonary Nodules Detected on CT Images: From the Fleischner Society 2017; Radiology 2017; 284:228-243. 4. Aortic and coronary artery atherosclerosis (ICD10-I70.0). Electronically Signed   By: Duanne Guess D.O.   On: 12/15/2022 15:40   CT Head Wo Contrast  Result Date: 12/15/2022 CLINICAL DATA:  Mental status  change, unknown cause EXAM: CT HEAD WITHOUT CONTRAST TECHNIQUE: Contiguous axial images were obtained from the base of the skull through the vertex without intravenous contrast. RADIATION DOSE REDUCTION: This exam was performed according to the departmental dose-optimization program which includes automated exposure control, adjustment of the mA and/or kV according to patient size and/or use of iterative reconstruction technique. COMPARISON:  10/07/2022 FINDINGS: Brain: No evidence of acute infarction, hemorrhage, hydrocephalus, extra-axial collection or mass lesion/mass effect. Patchy low-density changes within the periventricular and subcortical white matter most compatible with chronic microvascular ischemic change. Mild diffuse cerebral volume loss. Vascular: Atherosclerotic calcifications involving the large vessels of the skull base. No unexpected hyperdense vessel. Skull: Normal. Negative for fracture or focal lesion. Sinuses/Orbits: Chronic left paranasal sinus mucosal thickening. Scattered opacification within the left ethmoid air cells. Other: None. IMPRESSION: 1. No acute intracranial findings. 2. Chronic microvascular ischemic change and cerebral volume loss. Electronically Signed   By: Duanne Guess D.O.   On: 12/15/2022 15:29   DG Ankle Complete Left  Result Date: 12/15/2022 CLINICAL DATA:  89 year old male with history of trauma from a fall complaining of pain. EXAM: LEFT ANKLE COMPLETE - 3+ VIEW COMPARISON:  None Available.  FINDINGS: There is no evidence of fracture, dislocation, or joint effusion. Multifocal joint space narrowing, subchondral sclerosis and osteophyte formation noted in the tibiotalar joint and throughout the visualized portions of the midfoot and hindfoot, indicative of osteoarthritis. Numerous vascular calcifications. Mild soft tissue swelling around the ankle, most evident overlying the lateral malleolus. IMPRESSION: 1. Mild soft tissue swelling, most evident overlying the lateral malleolus. No underlying acute osseous abnormality. 2. Degenerative changes of osteoarthritis, as above. 3. Atherosclerosis. Electronically Signed   By: Trudie Reed M.D.   On: 12/15/2022 12:38   DG Chest 1 View  Result Date: 12/15/2022 CLINICAL DATA:  89 year old male with history of trauma from a fall. Chest pain. EXAM: CHEST  1 VIEW COMPARISON:  Chest x-ray 11/17/2022. FINDINGS: Left-sided single-lumen Port-A-Cath with tip terminating near the cavoatrial junction. Lung volumes are slightly low. Diffuse interstitial prominence and widespread peribronchial cuffing with an overall reticulonodular appearance to the lungs which is very similar to the prior study. No new acute consolidative airspace disease. No pleural effusions. No pneumothorax. No evidence of pulmonary edema. Heart size is normal. Upper mediastinal contours are within normal limits. IMPRESSION: 1. No acute findings in the thorax. 2. Chronic parenchymal changes in the lungs suggestive of chronic bronchitis and probable multifocal chronic postinfectious or inflammatory scarring, similar to prior studies, as above. 3. Aortic atherosclerosis. Electronically Signed   By: Trudie Reed M.D.   On: 12/15/2022 12:26   DG Tibia/Fibula Left  Result Date: 12/15/2022 CLINICAL DATA:  89 year old male with history of trauma from a fall. Left leg pain. EXAM: LEFT TIBIA AND FIBULA - 2 VIEW COMPARISON:  No priors. FINDINGS: Four views of the left leg demonstrate no acute  displaced fracture of the tibia or fibula. Numerous vascular calcifications are noted. IMPRESSION: 1. No acute radiographic abnormality of the left tibia or fibula. 2. Atherosclerosis. Electronically Signed   By: Trudie Reed M.D.   On: 12/15/2022 12:22   DG Ankle Complete Right  Result Date: 12/15/2022 CLINICAL DATA:  89 year old male with history of trauma from a fall complaining of left ankle pain. EXAM: RIGHT ANKLE - COMPLETE 3+ VIEW COMPARISON:  No priors. FINDINGS: There is no evidence of fracture, dislocation, or joint effusion. Degenerative changes of osteoarthritis are noted at the tibiotalar joint and throughout the visualized  portions of the midfoot and hindfoot. Numerous vascular calcifications are noted. Soft tissues are otherwise unremarkable. IMPRESSION: 1. No acute radiographic abnormality of the right ankle. 2. Osteoarthritis, as above. 3. Atherosclerosis. Electronically Signed   By: Trudie Reed M.D.   On: 12/15/2022 12:22    Procedures Procedures    Medications Ordered in ED Medications  ondansetron (ZOFRAN) injection 4 mg (4 mg Intravenous Not Given 12/15/22 1353)  ceFAZolin (ANCEF) IVPB 1 g/50 mL premix (1 g Intravenous New Bag/Given 12/15/22 1730)  cefTRIAXone (ROCEPHIN) 1 g in sodium chloride 0.9 % 100 mL IVPB (0 g Intravenous Stopped 12/15/22 1527)  iohexol (OMNIPAQUE) 300 MG/ML solution 100 mL (100 mLs Intravenous Contrast Given 12/15/22 1436)  lactated ringers bolus 500 mL (0 mLs Intravenous Stopped 12/15/22 1726)  acetaminophen (TYLENOL) tablet 1,000 mg (1,000 mg Oral Given 12/15/22 1730)    ED Course/ Medical Decision Making/ A&P Clinical Course as of 12/15/22 1737  Sun Dec 15, 2022  1213 X-rays of left lower leg and ankle, right ankle and chest do not show any acute fractures or dislocations.  No pneumothorax.  Does have significant vascular calcifications.  Awaiting radiology reading. [MB]  1330 Patient's wife is here now and can give me a little bit more  history.  She said since he was hospitalized back in July he has been very confused.  She made an appointment to follow-up with neurology but has not seen them yet.  She said that he was out in the yard yesterday and then she found him crawling.  Had pain in his leg.  This morning he was nauseous [MB]  1442 EKG not crossing in epic.  Sinus tachycardia nonspecific ST-T's. [MB]  1549 Discussed with Dr. Imogene Burn [facility] hospitalist who will evaluate patient for admission. [MB]    Clinical Course User Index [MB] Terrilee Files, MD                                 Medical Decision Making Amount and/or Complexity of Data Reviewed Labs: ordered. Radiology: ordered.  Risk Prescription drug management. Decision regarding hospitalization.   This patient complains of confusion for 3 weeks, left ankle pain and redness nausea vomiting; this involves an extensive number of treatment Options and is a complaint that carries with it a high risk of complications and morbidity. The differential includes sepsis, cellulitis, stroke, bleed, DVT, vasculitis  I ordered, reviewed and interpreted labs, which included CBC with normal white count, hemoglobin and platelets close slightly worse than baseline, chemistries with low sodium slightly elevated BUN, blood culture sent, lactate initially normal but slowly rising I ordered medication IV fluids IV antibiotics and reviewed PMP when indicated. I ordered imaging studies which included x-rays of left tib-fib and ankle right ankle chest CT head CT abdomen and pelvis and I independently    visualized and interpreted imaging which showed no acute fracture.  Does have a mass in his left lower lobe has been seen prior Additional history obtained from EMS and patient's wife Previous records obtained and reviewed in epic including discharge summary from July admission I consulted [facility] hospitalist Dr. Imogene Burn and discussed lab and imaging findings and discussed disposition.   Cardiac monitoring reviewed, sinus tachycardia Social determinants considered, patient is physically inactive Critical Interventions: None  After the interventions stated above, I reevaluated the patient and found patient still to be confused and now tachycardic. Admission and further testing considered, patient will benefit  from mission to the hospital for IV antibiotics and further workup of his altered mental status and possible left lower leg cellulitis.         Final Clinical Impression(s) / ED Diagnoses Final diagnoses:  Left leg cellulitis  Acute encephalopathy    Rx / DC Orders ED Discharge Orders     None         Terrilee Files, MD 12/15/22 [id number]

## 2022-12-15 NOTE — Assessment & Plan Note (Signed)
Observation med/tele bed. IV ancef. Left ankle is tender and swollen.  Skin overlying left ankle/lower leg is exquisitely sensitive to touch. Check MRSA screen. If positive, change abx to IV vanco. Vasculitis also a consideration but low probability as it is only affecting his left lower leg/ankle. Checking ESR, CRP.

## 2022-12-15 NOTE — ED Triage Notes (Signed)
Pt arrived [facility] from home with c/o bilateral ankle pain since this morning. Pt uses a walker.

## 2022-12-15 NOTE — Assessment & Plan Note (Signed)
Chronic Stable 

## 2022-12-15 NOTE — H&P (Signed)
History and Physical    Dale [name] EXB:[id number] DOB: April 09, 1929 DOA: 12/15/2022  DOS: the patient was seen and examined on 12/15/2022  PCP: Elfredia Nevins, MD   Patient coming from: Home  I have personally briefly reviewed patient's old medical records in [facility]  CC: confusion, left leg pain HPI: 89 year old male history of multiple myeloma, hypertension, coronary disease, hyperlipidemia, chronic hyponatremia, hypothyroidism, BPH, history of sarcoidosis who presents to the ER today with history of left lower leg pain and a rash.  Wife states the patient was outside yesterday working in the yard.  He was wearing socks and shoes.  She noticed the patient having pain in his leg.  He was brought to the ER.  He was noted to have a rash and some left ankle swelling.  X-rays are negative.  Wife states the patient has been confused since he was admitted to the hospital back in July for pneumonia.  Wife states that he has not been "right" since he was discharged.  Patient recently received chemotherapy with IV Velcade and IV Darzalex on 12/12/2022 for his history of multiple myeloma.  Arrival temp 98.3 blood pressure 195/75 satting 90% on room air.  White count 7.2, hemoglobin 10.9, platelets 81,000  Sodium 132, potassium 3.5, bicarb 26, BUN of 27, creatinine 0.87  Lactic acid 2.3  Blood cultures x 2 obtained  Left ankle x-rays were negative for acute fractures.  CT head showed no acute intracranial abnormality.  CT abdomen pelvis demonstrated a continued left lower lobe masslike opacity at the periphery that was seen also in July 2024.  Patient given 1 dose of IV Rocephin and 500 cc of lactated Ringer's  [facility] consulted.   ED Course: WBC 7.2, lactic acid 2.3, ankle xray negative.  Review of Systems:  Review of Systems  Unable to perform ROS: Mental acuity  Acute metabolic encephalopathy  Past Medical History:  Diagnosis Date   Allergic rhinitis     Anal fissure    Anginal pain (HCC)    Asthma    Cancer (HCC)    skin   Chest pain    CHF (congestive heart failure) (HCC)    Coronary heart disease    s/p stenting. cath in 01/2012 noncritical occlusion   Dyspnea    Dysrhythmia    1st degree heart block   GERD (gastroesophageal reflux disease)    Glaucoma    Hiatal hernia    Hyperlipidemia    Hypertension    Hypothyroidism    Idiopathic thrombocytopenic purpura (HCC) 2002   Macular degeneration    Multiple myeloma (HCC) 01/15/2021   Nephrolithiasis    PUD (peptic ulcer disease)    remote   Sarcoidosis    pulmonary   Schatzki's ring     Past Surgical History:  Procedure Laterality Date   cardiac stents     COLONOSCOPY  10/30/2006   Normal rectum, sigmoid diverticula.Remainder of colonic mucosa appeared normal.   CORONARY ANGIOPLASTY WITH STENT PLACEMENT     about 10 years ago per pt (around 2007)   CYSTOSCOPY WITH RETROGRADE PYELOGRAM, URETEROSCOPY AND STENT PLACEMENT Left 06/16/2017   Procedure: CYSTOSCOPY WITH RETROGRADE PYELOGRAM, URETEROSCOPY,STONE EXTRACTION  AND STENT PLACEMENT;  Surgeon: Marcine Matar, MD;  Location: [facility];  Service: Urology;  Laterality: Left;   ESOPHAGOGASTRODUODENOSCOPY  06/19/2004   Two esophageal rings and esophageal web as described above.  All of these were disrupted by passing 56-French Elease Hashimoto dilator/ Candida esophagitis,which appears to be incidental given history  of   antibiotic use, but nevertheless will be treated.   ESOPHAGOGASTRODUODENOSCOPY  10/30/2006   Distal tandem esophageal ring status post dilation disruption as  described above.  Otherwise normal esophagus/  Small hiatal hernia otherwise normal stomach, D1 and D2   ESOPHAGOGASTRODUODENOSCOPY N/A 03/22/2015   Dr.Rourk- noncritical schatzki's ring and hiatal hernia-o/w normal EGD.    ESOPHAGOGASTRODUODENOSCOPY (EGD) WITH ESOPHAGEAL DILATION  03/04/2012   RMR- schatzki's ring, hiatal hernia, polypoid gastric mucosa, bx=  minimally active gastritis.   HOLMIUM LASER APPLICATION Left 06/16/2017   Procedure: HOLMIUM LASER APPLICATION;  Surgeon: Marcine Matar, MD;  Location: [facility];  Service: Urology;  Laterality: Left;   IR CV LINE INJECTION  12/27/2021   IR GENERIC HISTORICAL  03/06/2016   IR RADIOLOGIST EVAL & MGMT 03/06/2016 Irish Lack, MD [facility]   IR GENERIC HISTORICAL  06/18/2016   IR RADIOLOGIST EVAL & MGMT 06/18/2016 Irish Lack, MD [facility]   IR IMAGING GUIDED PORT INSERTION  12/18/2021   IR IMAGING GUIDED PORT INSERTION  01/15/2022   IR RADIOLOGIST EVAL & MGMT  10/01/2016   IR RADIOLOGIST EVAL & MGMT  10/15/2017   IR RADIOLOGIST EVAL & MGMT  12/24/2018   IR RADIOLOGIST EVAL & MGMT  01/04/2020   IR RADIOLOGIST EVAL & MGMT  06/20/2021   IR REMOVAL TUN ACCESS W/ PORT W/O FL MOD SED  01/15/2022   LEFT HEART CATH N/A 02/02/2012   Procedure: LEFT HEART CATH;  Surgeon: Runell Gess, MD;  Location: [facility];  Service: Cardiovascular;  Laterality: N/A;   MEDIASTINOSCOPY     for dx sarcoid   RADIOLOGY WITH ANESTHESIA Left 05/17/2016   Procedure: left renal ablation;  Surgeon: Irish Lack, MD;  Location: [facility];  Service: Radiology;  Laterality: Left;     reports that he quit smoking about 52 years ago. His smoking use included cigarettes and cigars. He started smoking about 54 years ago. He has a 0.2 pack-year smoking history. He has never used smokeless tobacco. He reports that he does not drink alcohol and does not use drugs.  Allergies  Allergen Reactions   Azithromycin Other (See Comments)    Sore mouth and fever blisters around mouth, sores in nose area as well   Doxazosin Shortness Of Breath   Atenolol Other (See Comments)    UNKNOWN REACTION   Hydrocodone Nausea And Vomiting   Levofloxacin Other (See Comments)    Caused stomach problems.   Morphine Other (See Comments)    "made me crazy"   Penicillins Nausea And Vomiting and Other (See Comments)    Has patient had a  PCN reaction causing immediate rash, facial/tongue/throat swelling, SOB or lightheadedness with hypotension: No Has patient had a PCN reaction causing severe rash involving mucus membranes or skin necrosis: No Has patient had a PCN reaction that required hospitalization No Has patient had a PCN reaction occurring within the last 10 years: No If all of the above answers are "NO", then may proceed with Cephalosporin use.    Sulfonamide Derivatives Nausea And Vomiting    Family History  Problem Relation Age of Onset   Heart disease Father        deceased age 77   Stroke Mother    Alzheimer's disease Mother    Heart attack Brother        deceased age 45   Cancer Other        niece   Colon cancer Neg Hx  Prior to Admission medications   Medication Sig Start Date End Date Taking? Authorizing Provider  acetaminophen (TYLENOL) 325 MG tablet Take 2 tablets (650 mg total) by mouth every 6 (six) hours as needed for mild pain (or Fever >/= 101). 01/17/22   Vassie Loll, MD  acyclovir (ZOVIRAX) 400 MG tablet TAKE 1 TABLET BY MOUTH TWICE  DAILY 12/02/22   Doreatha Massed, MD  Ascorbic Acid (VITAMIN C PO) Take 500 mg by mouth every evening.    [provider]  aspirin EC 81 MG tablet Take 1 tablet (81 mg total) by mouth daily with breakfast. For 30 days Only Patient taking differently: Take 81 mg by mouth daily with breakfast. Every Monday, Wednesday, and Friday 06/04/22 06/04/23  Shon Hale, MD  atorvastatin (LIPITOR) 40 MG tablet TAKE 1 TABLET BY MOUTH IN THE  EVENING 09/06/22   Lennette Bihari, MD  Bortezomib (VELCADE IJ) Inject as directed once a week.    [provider]  brimonidine (ALPHAGAN) 0.2 % ophthalmic solution Place 1 drop into the left eye 2 (two) times daily.    [provider]  cyanocobalamin 1000 MCG tablet Take 1 tablet (1,000 mcg total) by mouth daily. 01/18/22   Vassie Loll, MD  dexamethasone (DECADRON) 2 MG tablet TAKE 5 TABLETS BY MOUTH  ONCE  WEEKLY 12/02/22   Doreatha Massed, MD  dorzolamide-timolol (COSOPT) 22.3-6.8 MG/ML ophthalmic solution Place 1 drop into the left eye 2 (two) times daily.    [provider]  feeding supplement (ENSURE ENLIVE / ENSURE PLUS) LIQD Take 237 mLs by mouth 3 (three) times daily between meals. 01/17/22   Vassie Loll, MD  furosemide (LASIX) 20 MG tablet TAKE 2 TABLETS BY MOUTH DAILY, IF YOU NOTICE ANY SWELLING. 08/01/22   Lennette Bihari, MD  isosorbide mononitrate (IMDUR) 60 MG 24 hr tablet TAKE 1 AND 1/2 TABLETS BY MOUTH  IN THE MORNING AND ONE-HALF  TABLET BY MOUTH AT NIGHT 08/20/22   Lennette Bihari, MD  levothyroxine (SYNTHROID) 75 MCG tablet Take 75 mcg by mouth daily before breakfast. 07/16/21   [provider]  lidocaine-prilocaine (EMLA) cream Apply 1 Application topically as needed. 12/20/21   Doreatha Massed, MD  losartan (COZAAR) 100 MG tablet TAKE 1 TABLET BY MOUTH DAILY Patient taking differently: Take 100 mg by mouth daily. 04/10/22   Lennette Bihari, MD  metoprolol tartrate (LOPRESSOR) 25 MG tablet TAKE 1 TABLET BY MOUTH IN THE  MORNING AND ONE-HALF TABLET BY  MOUTH IN THE EVENING 08/23/22   Lennette Bihari, MD  Multiple Vitamins-Minerals (PRESERVISION/LUTEIN) CAPS Take 1 capsule by mouth 2 (two) times daily.    [provider]  nitroGLYCERIN (NITROSTAT) 0.4 MG SL tablet Place 1 tablet (0.4 mg total) under the tongue every 5 (five) minutes as needed. For chest pain 02/15/22   Lennette Bihari, MD  pantoprazole (PROTONIX) 40 MG tablet TAKE 1 TABLET BY MOUTH DAILY  BEFORE BREAKFAST Patient taking differently: Take 40 mg by mouth daily. 01/28/22   Aida Raider, NP  potassium chloride SA (KLOR-CON M) 20 MEQ tablet Take 20 mEq by mouth daily. 06/22/22   [provider]  prochlorperazine (COMPAZINE) 10 MG tablet Take 1 tablet (10 mg total) by mouth every 6 (six) hours as needed for nausea or vomiting. 12/20/21   Doreatha Massed, MD  ROCKLATAN  0.02-0.005 % SOLN Place 1 drop into the left eye at bedtime. 12/21/19   [provider]  triamcinolone (KENALOG) 0.1 % cream Apply  1 application. topically 2 (two) times daily as needed (for irritation).    [provider]  vitamin E 200 UNIT capsule Take 200 Units by mouth every evening.    [provider]    Physical Exam: Vitals:   12/15/22 1126 12/15/22 1127 12/15/22 1129 12/15/22 1230  BP:   (!) 195/75 (!) 209/89  Pulse:   89 (!) 111  Resp:  17    Temp:  98.3 F (36.8 C)    TempSrc:  Oral    SpO2:   98% 98%  Weight: 57.2 kg     Height: 5\' 11"  (1.803 m)       Physical Exam Vitals and nursing note reviewed.  Constitutional:      General: He is not in acute distress.    Appearance: He is not toxic-appearing or diaphoretic.  HENT:     Head: Normocephalic and atraumatic.  Cardiovascular:     Rate and Rhythm: Regular rhythm. Tachycardia present.  Pulmonary:     Effort: Pulmonary effort is normal. No respiratory distress.     Breath sounds: Normal breath sounds.  Abdominal:     General: Abdomen is flat. Bowel sounds are normal.     Palpations: Abdomen is soft.  Musculoskeletal:     Right lower leg: No edema.     Left lower leg: 1+ Edema present.     Right ankle: Normal. No swelling.     Left ankle: Swelling present.     Comments: Left ankle edema  Skin:    Capillary Refill: Capillary refill takes less than 2 seconds.     Findings: Erythema present.     Comments: Erythema and tenderness of skin of left lower leg  See picture  Neurological:     Mental Status: He is disoriented.      Labs on Admission: I have personally reviewed following labs and imaging studies  CBC: Recent Labs  Lab 12/12/22 0752 12/15/22 1227  WBC 3.1* 7.2  NEUTROABS 1.5* 6.2  HGB 9.5* 10.9*  HCT 29.0* 32.7*  MCV 111.1* 112.4*  PLT 143* 81*   Basic Metabolic Panel: Recent Labs  Lab 12/12/22 0752 12/15/22 1227  NA 132* 132*  K 3.7 3.5  CL 102 100  CO2 24  26  GLUCOSE 115* 112*  BUN 18 24*  CREATININE 0.88 0.87  CALCIUM 9.0 9.1  MG 2.0  --    GFR: Estimated Creatinine Clearance: 43.8 mL/min (by C-G formula based on SCr of 0.87 mg/dL). Liver Function Tests: Recent Labs  Lab 12/12/22 0752 12/15/22 1227  AST 21 24  ALT 26 37  ALKPHOS 64 69  BILITOT 0.4 0.8  PROT 8.1 8.8*  ALBUMIN 2.9* 3.2*   Urine analysis:    Component Value Date/Time   COLORURINE YELLOW 11/17/2022 0322   APPEARANCEUR CLEAR 11/17/2022 0322   APPEARANCEUR Clear 08/21/2021 1415   LABSPEC 1.014 11/17/2022 0322   PHURINE 6.0 11/17/2022 0322   GLUCOSEU NEGATIVE 11/17/2022 0322   HGBUR SMALL (A) 11/17/2022 0322   BILIRUBINUR NEGATIVE 11/17/2022 0322   BILIRUBINUR Negative 08/21/2021 1415   KETONESUR NEGATIVE 11/17/2022 0322   PROTEINUR 30 (A) 11/17/2022 0322   UROBILINOGEN 1.0 02/02/2012 0630   NITRITE NEGATIVE 11/17/2022 0322   LEUKOCYTESUR NEGATIVE 11/17/2022 0322    Radiological Exams on Admission: I have personally reviewed images CT ABDOMEN PELVIS W CONTRAST  Result Date: 12/15/2022 CLINICAL DATA:  Abdominal pain, acute, nonlocalized EXAM: CT ABDOMEN AND PELVIS WITH CONTRAST TECHNIQUE: Multidetector CT imaging of the abdomen  and pelvis was performed using the standard protocol following bolus administration of intravenous contrast. RADIATION DOSE REDUCTION: This exam was performed according to the departmental dose-optimization program which includes automated exposure control, adjustment of the mA and/or kV according to patient size and/or use of iterative reconstruction technique. CONTRAST:  OMNIPAQUE IOHEXOL 300 MG/ML  SOLN COMPARISON:  11/17/2022 FINDINGS: Lower chest: Mass-like opacity at the periphery of the left lower lobe measures approximately 4.6 x 1.3 x 2.6 cm, previously measured approximately 3.9 x 2.0 x 2.6 cm. Mild adjacent ground-glass opacity has slightly improved. Heart size is normal. Coronary artery atherosclerosis. Hepatobiliary:  Unchanged appearance of the liver including prominent left hepatic lobe cyst. No new focal liver abnormality. Unremarkable gallbladder. No hyperdense gallstone. Similar mild biliary dilatation. Pancreas: Unremarkable. No pancreatic ductal dilatation or surrounding inflammatory changes. Spleen: Normal in size without focal abnormality. Adrenals/Urinary Tract: Unremarkable adrenal glands. Kidneys enhance symmetrically without focal lesion, stone, or hydronephrosis. Ureters are nondilated. There is a anterior urinary bladder wall diverticulum. Stomach/Bowel: Stomach within normal limits. No abnormally dilated loops of bowel. Mild wall thickening of the proximal to mid transverse colon. Extensive sigmoid diverticulosis. Moderate volume of stool within the colon. Vascular/Lymphatic: Aortic atherosclerosis. No enlarged abdominal or pelvic lymph nodes. Reproductive: Prostate is unremarkable. Other: No free fluid. No abdominopelvic fluid collection. No pneumoperitoneum. Small fat containing right inguinal hernia. Musculoskeletal: No acute or significant osseous findings. IMPRESSION: 1. Mild wall thickening of the proximal to mid transverse colon, which may be secondary to underdistention or mild colitis. 2. Extensive sigmoid diverticulosis without evidence of acute diverticulitis. 3. Mass-like opacity at the periphery of the left lower lobe measures approximately 4.6 x 1.3 x 2.6 cm, previously measured approximately 3.9 x 2.0 x 2.6 cm. Consider one of the following in 3 months for both low-risk and high-risk individuals: (a) repeat chest CT, (b) follow-up PET-CT, or (c) tissue sampling. This recommendation follows the consensus statement: Guidelines for Management of Incidental Pulmonary Nodules Detected on CT Images: From the Fleischner Society 2017; Radiology 2017; 284:228-243. 4. Aortic and coronary artery atherosclerosis (ICD10-I70.0). Electronically Signed   By: Duanne Guess D.O.   On: 12/15/2022 15:40   CT Head  Wo Contrast  Result Date: 12/15/2022 CLINICAL DATA:  Mental status change, unknown cause EXAM: CT HEAD WITHOUT CONTRAST TECHNIQUE: Contiguous axial images were obtained from the base of the skull through the vertex without intravenous contrast. RADIATION DOSE REDUCTION: This exam was performed according to the departmental dose-optimization program which includes automated exposure control, adjustment of the mA and/or kV according to patient size and/or use of iterative reconstruction technique. COMPARISON:  10/07/2022 FINDINGS: Brain: No evidence of acute infarction, hemorrhage, hydrocephalus, extra-axial collection or mass lesion/mass effect. Patchy low-density changes within the periventricular and subcortical white matter most compatible with chronic microvascular ischemic change. Mild diffuse cerebral volume loss. Vascular: Atherosclerotic calcifications involving the large vessels of the skull base. No unexpected hyperdense vessel. Skull: Normal. Negative for fracture or focal lesion. Sinuses/Orbits: Chronic left paranasal sinus mucosal thickening. Scattered opacification within the left ethmoid air cells. Other: None. IMPRESSION: 1. No acute intracranial findings. 2. Chronic microvascular ischemic change and cerebral volume loss. Electronically Signed   By: Duanne Guess D.O.   On: 12/15/2022 15:29   DG Ankle Complete Left  Result Date: 12/15/2022 CLINICAL DATA:  89 year old male with history of trauma from a fall complaining of pain. EXAM: LEFT ANKLE COMPLETE - 3+ VIEW COMPARISON:  None Available. FINDINGS: There is no evidence of fracture, dislocation, or  joint effusion. Multifocal joint space narrowing, subchondral sclerosis and osteophyte formation noted in the tibiotalar joint and throughout the visualized portions of the midfoot and hindfoot, indicative of osteoarthritis. Numerous vascular calcifications. Mild soft tissue swelling around the ankle, most evident overlying the lateral malleolus.  IMPRESSION: 1. Mild soft tissue swelling, most evident overlying the lateral malleolus. No underlying acute osseous abnormality. 2. Degenerative changes of osteoarthritis, as above. 3. Atherosclerosis. Electronically Signed   By: Trudie Reed M.D.   On: 12/15/2022 12:38   DG Chest 1 View  Result Date: 12/15/2022 CLINICAL DATA:  89 year old male with history of trauma from a fall. Chest pain. EXAM: CHEST  1 VIEW COMPARISON:  Chest x-ray 11/17/2022. FINDINGS: Left-sided single-lumen Port-A-Cath with tip terminating near the cavoatrial junction. Lung volumes are slightly low. Diffuse interstitial prominence and widespread peribronchial cuffing with an overall reticulonodular appearance to the lungs which is very similar to the prior study. No new acute consolidative airspace disease. No pleural effusions. No pneumothorax. No evidence of pulmonary edema. Heart size is normal. Upper mediastinal contours are within normal limits. IMPRESSION: 1. No acute findings in the thorax. 2. Chronic parenchymal changes in the lungs suggestive of chronic bronchitis and probable multifocal chronic postinfectious or inflammatory scarring, similar to prior studies, as above. 3. Aortic atherosclerosis. Electronically Signed   By: Trudie Reed M.D.   On: 12/15/2022 12:26   DG Tibia/Fibula Left  Result Date: 12/15/2022 CLINICAL DATA:  89 year old male with history of trauma from a fall. Left leg pain. EXAM: LEFT TIBIA AND FIBULA - 2 VIEW COMPARISON:  No priors. FINDINGS: Four views of the left leg demonstrate no acute displaced fracture of the tibia or fibula. Numerous vascular calcifications are noted. IMPRESSION: 1. No acute radiographic abnormality of the left tibia or fibula. 2. Atherosclerosis. Electronically Signed   By: Trudie Reed M.D.   On: 12/15/2022 12:22   DG Ankle Complete Right  Result Date: 12/15/2022 CLINICAL DATA:  89 year old male with history of trauma from a fall complaining of left ankle pain.  EXAM: RIGHT ANKLE - COMPLETE 3+ VIEW COMPARISON:  No priors. FINDINGS: There is no evidence of fracture, dislocation, or joint effusion. Degenerative changes of osteoarthritis are noted at the tibiotalar joint and throughout the visualized portions of the midfoot and hindfoot. Numerous vascular calcifications are noted. Soft tissues are otherwise unremarkable. IMPRESSION: 1. No acute radiographic abnormality of the right ankle. 2. Osteoarthritis, as above. 3. Atherosclerosis. Electronically Signed   By: Trudie Reed M.D.   On: 12/15/2022 12:22    EKG: My personal interpretation of EKG shows: sinus tachycardia    Assessment/Plan Principal Problem:   Cellulitis of left ankle Active Problems:   Acute metabolic encephalopathy   HYPERCHOLESTEROLEMIA   Essential hypertension   Thrombocytopenia (HCC)   Multiple myeloma (HCC)   Chronic hyponatremia - baseline 125-134   DNR (do not resuscitate)/DNI(Do Not Intubate)    Assessment and Plan: * Cellulitis of left ankle Observation med/tele bed. IV ancef. Left ankle is tender and swollen.  Skin overlying left ankle/lower leg is exquisitely sensitive to touch. Check MRSA screen. If positive, change abx to IV vanco. Vasculitis also a consideration but low probability as it is only affecting his left lower leg/ankle. Checking ESR, CRP.  Acute metabolic encephalopathy Pt's wife states pt has been confused since he was discharged in July 2024. If pt has cellulitis with systemic symptoms, that could cause his confusion. But he is 89 yo. I would expect some form of senile dementia at  this age.  Chronic hyponatremia - baseline 125-134 Chronic.  Multiple myeloma (HCC) Recent IV infusion per oncology. He has had several other infusions in the past without reaction but still need to consider his chemo as a causative agent.  Thrombocytopenia (HCC) Chronic. Stable.  Essential hypertension Continue with home HTN  meds.  HYPERCHOLESTEROLEMIA Stable.  DNR (do not resuscitate)/DNI(Do Not Intubate) Verified DNR/DNI status with pt's wife at bedside    DVT prophylaxis: SCDs Code Status: DNR/DNI(Do NOT Intubate). Verified with pt's wife. Verified DNR form Family Communication: discussed with pt's wife at bedside Ander Slade) Disposition Plan: return home  Consults called: none  Admission status: Observation, Telemetry bed   Carollee Herter, DO [facility] 12/15/2022, 4:22 PM

## 2022-12-15 NOTE — Assessment & Plan Note (Signed)
Recent IV infusion per oncology. He has had several other infusions in the past without reaction but still need to consider his chemo as a causative agent.

## 2022-12-15 NOTE — Assessment & Plan Note (Signed)
Pt's wife states pt has been confused since he was discharged in July 2024. If pt has cellulitis with systemic symptoms, that could cause his confusion. But he is 89 yo. I would expect some form of senile dementia at this age.

## 2022-12-15 NOTE — Assessment & Plan Note (Signed)
Chronic. 

## 2022-12-15 NOTE — ED Notes (Signed)
ED TO INPATIENT HANDOFF REPORT  ED Nurse Name and Phone #: Wandra Mannan, Paramedic [phone]  S Name/Age/Gender Dale [name] 89 y.o. male Room/Bed: APA07/APA07  Code Status   Code Status: DNR  Home/SNF/Other Home Patient oriented to: self and place Is this baseline? Yes   Triage Complete: Triage complete  Chief Complaint Cellulitis of left ankle [L03.116]  Triage Note Pt arrived [facility] from home with c/o bilateral ankle pain since this morning. Pt uses a walker.    Allergies Allergies  Allergen Reactions   Azithromycin Other (See Comments)    Sore mouth and fever blisters around mouth, sores in nose area as well   Doxazosin Shortness Of Breath   Atenolol Other (See Comments)    UNKNOWN REACTION   Hydrocodone Nausea And Vomiting   Levofloxacin Other (See Comments)    Caused stomach problems.   Morphine Other (See Comments)    "made me crazy"   Penicillins Nausea And Vomiting and Other (See Comments)    Has patient had a PCN reaction causing immediate rash, facial/tongue/throat swelling, SOB or lightheadedness with hypotension: No Has patient had a PCN reaction causing severe rash involving mucus membranes or skin necrosis: No Has patient had a PCN reaction that required hospitalization No Has patient had a PCN reaction occurring within the last 10 years: No If all of the above answers are "NO", then may proceed with Cephalosporin use.    Sulfonamide Derivatives Nausea And Vomiting    Level of Care/Admitting Diagnosis ED Disposition     ED Disposition  Admit   Condition  --   Comment  Hospital Area: [facility] [[id number]]  Level of Care: Telemetry [5]  Covid Evaluation: Asymptomatic - no recent exposure (last 10 days) testing not required  Diagnosis: Cellulitis of left ankle [730064]  Admitting Physician: Imogene Burn, ERIC [[id number]]  Attending Physician: Imogene Burn, ERIC [[id number]]          B Medical/Surgery History Past Medical History:  Diagnosis Date    Allergic rhinitis    Anal fissure    Anginal pain (HCC)    Asthma    Cancer (HCC)    skin   Chest pain    CHF (congestive heart failure) (HCC)    Coronary heart disease    s/p stenting. cath in 01/2012 noncritical occlusion   Dyspnea    Dysrhythmia    1st degree heart block   GERD (gastroesophageal reflux disease)    Glaucoma    Hiatal hernia    Hyperlipidemia    Hypertension    Hypothyroidism    Idiopathic thrombocytopenic purpura (HCC) 2002   Macular degeneration    Multiple myeloma (HCC) 01/15/2021   Nephrolithiasis    PUD (peptic ulcer disease)    remote   Sarcoidosis    pulmonary   Schatzki's ring    Past Surgical History:  Procedure Laterality Date   cardiac stents     COLONOSCOPY  10/30/2006   Normal rectum, sigmoid diverticula.Remainder of colonic mucosa appeared normal.   CORONARY ANGIOPLASTY WITH STENT PLACEMENT     about 10 years ago per pt (around 2007)   CYSTOSCOPY WITH RETROGRADE PYELOGRAM, URETEROSCOPY AND STENT PLACEMENT Left 06/16/2017   Procedure: CYSTOSCOPY WITH RETROGRADE PYELOGRAM, URETEROSCOPY,STONE EXTRACTION  AND STENT PLACEMENT;  Surgeon: Marcine Matar, MD;  Location: [facility];  Service: Urology;  Laterality: Left;   ESOPHAGOGASTRODUODENOSCOPY  06/19/2004   Two esophageal rings and esophageal web as described above.  All of these were disrupted by passing 56-French Elease Hashimoto dilator/ Candida  esophagitis,which appears to be incidental given history of   antibiotic use, but nevertheless will be treated.   ESOPHAGOGASTRODUODENOSCOPY  10/30/2006   Distal tandem esophageal ring status post dilation disruption as  described above.  Otherwise normal esophagus/  Small hiatal hernia otherwise normal stomach, D1 and D2   ESOPHAGOGASTRODUODENOSCOPY N/A 03/22/2015   Dr.Rourk- noncritical schatzki's ring and hiatal hernia-o/w normal EGD.    ESOPHAGOGASTRODUODENOSCOPY (EGD) WITH ESOPHAGEAL DILATION  03/04/2012   RMR- schatzki's ring, hiatal hernia, polypoid  gastric mucosa, bx= minimally active gastritis.   HOLMIUM LASER APPLICATION Left 06/16/2017   Procedure: HOLMIUM LASER APPLICATION;  Surgeon: Marcine Matar, MD;  Location: [facility];  Service: Urology;  Laterality: Left;   IR CV LINE INJECTION  12/27/2021   IR GENERIC HISTORICAL  03/06/2016   IR RADIOLOGIST EVAL & MGMT 03/06/2016 Irish Lack, MD [facility] INTERV RAD   IR GENERIC HISTORICAL  06/18/2016   IR RADIOLOGIST EVAL & MGMT 06/18/2016 Irish Lack, MD [facility] INTERV RAD   IR IMAGING GUIDED PORT INSERTION  12/18/2021   IR IMAGING GUIDED PORT INSERTION  01/15/2022   IR RADIOLOGIST EVAL & MGMT  10/01/2016   IR RADIOLOGIST EVAL & MGMT  10/15/2017   IR RADIOLOGIST EVAL & MGMT  12/24/2018   IR RADIOLOGIST EVAL & MGMT  01/04/2020   IR RADIOLOGIST EVAL & MGMT  06/20/2021   IR REMOVAL TUN ACCESS W/ PORT W/O FL MOD SED  01/15/2022   LEFT HEART CATH N/A 02/02/2012   Procedure: LEFT HEART CATH;  Surgeon: Runell Gess, MD;  Location: [facility] CATH LAB;  Service: Cardiovascular;  Laterality: N/A;   MEDIASTINOSCOPY     for dx sarcoid   RADIOLOGY WITH ANESTHESIA Left 05/17/2016   Procedure: left renal ablation;  Surgeon: Irish Lack, MD;  Location: [facility];  Service: Radiology;  Laterality: Left;     A IV Location/Drains/Wounds Patient Lines/Drains/Airways Status     Active Line/Drains/Airways     Name Placement date Placement time Site Days   Implanted Port 01/15/22 Left Chest 01/15/22  1300  Chest  334   Peripheral IV 12/15/22 18 G 1.16" Anterior;Proximal;Right Forearm 12/15/22  1322  Forearm  less than 1            Intake/Output Last 24 hours No intake or output data in the 24 hours ending 12/15/22 1727  Labs/Imaging Results for orders placed or performed during the hospital encounter of 12/15/22 (from the past 48 hour(s))  Comprehensive metabolic panel     Status: Abnormal   Collection Time: 12/15/22 12:27 PM  Result Value Ref Range   Sodium 132 (L) 135 - 145 mmol/L   Potassium 3.5 3.5  - 5.1 mmol/L   Chloride 100 98 - 111 mmol/L   CO2 26 22 - 32 mmol/L   Glucose, Bld 112 (H) 70 - 99 mg/dL    Comment: Glucose reference range applies only to samples taken after fasting for at least 8 hours.   BUN 24 (H) 8 - 23 mg/dL   Creatinine, Ser 1.02 0.61 - 1.24 mg/dL   Calcium 9.1 8.9 - 72.5 mg/dL   Total Protein 8.8 (H) 6.5 - 8.1 g/dL   Albumin 3.2 (L) 3.5 - 5.0 g/dL   AST 24 15 - 41 U/L   ALT 37 0 - 44 U/L   Alkaline Phosphatase 69 38 - 126 U/L   Total Bilirubin 0.8 0.3 - 1.2 mg/dL   GFR, Estimated >36 >64 mL/min    Comment: (NOTE) Calculated using the CKD-EPI  Creatinine Equation (2021)    Anion gap 6 5 - 15    Comment: Performed at [facility], [address] [name], [place] [address]  CBC with Differential     Status: Abnormal   Collection Time: 12/15/22 12:27 PM  Result Value Ref Range   WBC 7.2 4.0 - 10.5 K/uL   RBC 2.91 (L) 4.22 - 5.81 MIL/uL   Hemoglobin 10.9 (L) 13.0 - 17.0 g/dL   HCT 19.1 (L) 47.8 - 29.5 %   MCV 112.4 (H) 80.0 - 100.0 fL   MCH 37.5 (H) 26.0 - 34.0 pg   MCHC 33.3 30.0 - 36.0 g/dL   RDW 62.1 (H) 30.8 - 65.7 %   Platelets 81 (L) 150 - 400 K/uL    Comment: SPECIMEN CHECKED FOR CLOTS Immature Platelet Fraction may be clinically indicated, consider ordering this additional test [id number] REPEATED TO VERIFY PLATELET COUNT CONFIRMED BY SMEAR    nRBC 0.0 0.0 - 0.2 %   Neutrophils Relative % 87 %   Neutro Abs 6.2 1.7 - 7.7 K/uL   Lymphocytes Relative 7 %   Lymphs Abs 0.5 (L) 0.7 - 4.0 K/uL   Monocytes Relative 6 %   Monocytes Absolute 0.4 0.1 - 1.0 K/uL   Eosinophils Relative 0 %   Eosinophils Absolute 0.0 0.0 - 0.5 K/uL   Basophils Relative 0 %   Basophils Absolute 0.0 0.0 - 0.1 K/uL   WBC Morphology MORPHOLOGY UNREMARKABLE    RBC Morphology See Note     Comment: MACROCYTES SEEN   Smear Review MORPHOLOGY UNREMARKABLE    Immature Granulocytes 0 %   Abs Immature Granulocytes 0.02 0.00 - 0.07 K/uL   Polychromasia PRESENT     Comment:  Performed at [facility], [address] [place], [place] [address]  Culture, blood (routine x 2)     Status: None (Preliminary result)   Collection Time: 12/15/22 12:27 PM   Specimen: BLOOD RIGHT ARM  Result Value Ref Range   Specimen Description      BLOOD RIGHT ARM BOTTLES DRAWN AEROBIC AND ANAEROBIC   Special Requests      Blood Culture adequate volume Performed at [facility], [address] [place], [place] [address]    Culture PENDING    Report Status PENDING   Culture, blood (routine x 2)     Status: None (Preliminary result)   Collection Time: 12/15/22 12:27 PM   Specimen: Left Antecubital; Blood  Result Value Ref Range   Specimen Description      LEFT ANTECUBITAL BOTTLES DRAWN AEROBIC AND ANAEROBIC   Special Requests      Blood Culture adequate volume Performed at [facility], [address], [place], [place] [address]    Culture PENDING    Report Status PENDING   Lactic acid, plasma     Status: None   Collection Time: 12/15/22 12:27 PM  Result Value Ref Range   Lactic Acid, Venous 1.9 0.5 - 1.9 mmol/L    Comment: Performed at [facility], [address] [place], [place] [address]  Sedimentation rate     Status: Abnormal   Collection Time: 12/15/22 12:27 PM  Result Value Ref Range   Sed Rate 70 (H) 0 - 16 mm/hr    Comment: Performed at [facility], [address] [place], [place] [address]  Lactic acid, plasma     Status: Abnormal   Collection Time: 12/15/22  2:23 PM  Result Value Ref Range   Lactic Acid, Venous 2.3 (HH) 0.5 -  1.9 mmol/L    Comment: CRITICAL RESULT CALLED TO, READ BACK BY AND VERIFIED WITH   12/15/22 1449 NN Performed at [facility], [address], [place], [place] [address]   Procalcitonin     Status: None   Collection Time: 12/15/22  4:10 PM  Result Value Ref Range   Procalcitonin 0.42 ng/mL    Comment:        Interpretation: PCT (Procalcitonin) <= 0.5 ng/mL: Systemic infection (sepsis) is not likely. Local bacterial  infection is possible. (NOTE)       Sepsis PCT Algorithm           Lower Respiratory Tract                                      Infection PCT Algorithm    ----------------------------     ----------------------------         PCT < 0.25 ng/mL                PCT < 0.10 ng/mL          Strongly encourage             Strongly discourage   discontinuation of antibiotics    initiation of antibiotics    ----------------------------     -----------------------------       PCT 0.25 - 0.50 ng/mL            PCT 0.10 - 0.25 ng/mL               OR       >80% decrease in PCT            Discourage initiation of                                            antibiotics      Encourage discontinuation           of antibiotics    ----------------------------     -----------------------------         PCT >= 0.50 ng/mL              PCT 0.26 - 0.50 ng/mL               AND        <80% decrease in PCT             Encourage initiation of                                             antibiotics       Encourage continuation           of antibiotics    ----------------------------     -----------------------------        PCT >= 0.50 ng/mL                  PCT > 0.50 ng/mL               AND         increase in PCT                  Strongly encourage  initiation of antibiotics    Strongly encourage escalation           of antibiotics                                     -----------------------------                                           PCT <= 0.25 ng/mL                                                 OR                                        > 80% decrease in PCT                                      Discontinue / Do not initiate                                             antibiotics  Performed at [facility], [address], [place], [place] [address]    *Note: Due to a large number of results and/or encounters for the requested time period, some results have not been  displayed. A complete set of results can be found in Results Review.   CT ABDOMEN PELVIS W CONTRAST  Result Date: 12/15/2022 CLINICAL DATA:  Abdominal pain, acute, nonlocalized EXAM: CT ABDOMEN AND PELVIS WITH CONTRAST TECHNIQUE: Multidetector CT imaging of the abdomen and pelvis was performed using the standard protocol following bolus administration of intravenous contrast. RADIATION DOSE REDUCTION: This exam was performed according to the departmental dose-optimization program which includes automated exposure control, adjustment of the mA and/or kV according to patient size and/or use of iterative reconstruction technique. CONTRAST:  OMNIPAQUE IOHEXOL 300 MG/ML  SOLN COMPARISON:  11/17/2022 FINDINGS: Lower chest: Mass-like opacity at the periphery of the left lower lobe measures approximately 4.6 x 1.3 x 2.6 cm, previously measured approximately 3.9 x 2.0 x 2.6 cm. Mild adjacent ground-glass opacity has slightly improved. Heart size is normal. Coronary artery atherosclerosis. Hepatobiliary: Unchanged appearance of the liver including prominent left hepatic lobe cyst. No new focal liver abnormality. Unremarkable gallbladder. No hyperdense gallstone. Similar mild biliary dilatation. Pancreas: Unremarkable. No pancreatic ductal dilatation or surrounding inflammatory changes. Spleen: Normal in size without focal abnormality. Adrenals/Urinary Tract: Unremarkable adrenal glands. Kidneys enhance symmetrically without focal lesion, stone, or hydronephrosis. Ureters are nondilated. There is a anterior urinary bladder wall diverticulum. Stomach/Bowel: Stomach within normal limits. No abnormally dilated loops of bowel. Mild wall thickening of the proximal to mid transverse colon. Extensive sigmoid diverticulosis. Moderate volume of stool within the colon. Vascular/Lymphatic: Aortic atherosclerosis. No enlarged abdominal or pelvic lymph nodes. Reproductive: Prostate is unremarkable. Other: No free fluid. No  abdominopelvic fluid collection. No pneumoperitoneum. Small fat containing right inguinal hernia. Musculoskeletal: No  acute or significant osseous findings. IMPRESSION: 1. Mild wall thickening of the proximal to mid transverse colon, which may be secondary to underdistention or mild colitis. 2. Extensive sigmoid diverticulosis without evidence of acute diverticulitis. 3. Mass-like opacity at the periphery of the left lower lobe measures approximately 4.6 x 1.3 x 2.6 cm, previously measured approximately 3.9 x 2.0 x 2.6 cm. Consider one of the following in 3 months for both low-risk and high-risk individuals: (a) repeat chest CT, (b) follow-up PET-CT, or (c) tissue sampling. This recommendation follows the consensus statement: Guidelines for Management of Incidental Pulmonary Nodules Detected on CT Images: From the Fleischner Society 2017; Radiology 2017; 284:228-243. 4. Aortic and coronary artery atherosclerosis (ICD10-I70.0). Electronically Signed   By: Duanne Guess D.O.   On: 12/15/2022 15:40   CT Head Wo Contrast  Result Date: 12/15/2022 CLINICAL DATA:  Mental status change, unknown cause EXAM: CT HEAD WITHOUT CONTRAST TECHNIQUE: Contiguous axial images were obtained from the base of the skull through the vertex without intravenous contrast. RADIATION DOSE REDUCTION: This exam was performed according to the departmental dose-optimization program which includes automated exposure control, adjustment of the mA and/or kV according to patient size and/or use of iterative reconstruction technique. COMPARISON:  10/07/2022 FINDINGS: Brain: No evidence of acute infarction, hemorrhage, hydrocephalus, extra-axial collection or mass lesion/mass effect. Patchy low-density changes within the periventricular and subcortical white matter most compatible with chronic microvascular ischemic change. Mild diffuse cerebral volume loss. Vascular: Atherosclerotic calcifications involving the large vessels of the skull base. No  unexpected hyperdense vessel. Skull: Normal. Negative for fracture or focal lesion. Sinuses/Orbits: Chronic left paranasal sinus mucosal thickening. Scattered opacification within the left ethmoid air cells. Other: None. IMPRESSION: 1. No acute intracranial findings. 2. Chronic microvascular ischemic change and cerebral volume loss. Electronically Signed   By: Duanne Guess D.O.   On: 12/15/2022 15:29   DG Ankle Complete Left  Result Date: 12/15/2022 CLINICAL DATA:  89 year old male with history of trauma from a fall complaining of pain. EXAM: LEFT ANKLE COMPLETE - 3+ VIEW COMPARISON:  None Available. FINDINGS: There is no evidence of fracture, dislocation, or joint effusion. Multifocal joint space narrowing, subchondral sclerosis and osteophyte formation noted in the tibiotalar joint and throughout the visualized portions of the midfoot and hindfoot, indicative of osteoarthritis. Numerous vascular calcifications. Mild soft tissue swelling around the ankle, most evident overlying the lateral malleolus. IMPRESSION: 1. Mild soft tissue swelling, most evident overlying the lateral malleolus. No underlying acute osseous abnormality. 2. Degenerative changes of osteoarthritis, as above. 3. Atherosclerosis. Electronically Signed   By: Trudie Reed M.D.   On: 12/15/2022 12:38   DG Chest 1 View  Result Date: 12/15/2022 CLINICAL DATA:  89 year old male with history of trauma from a fall. Chest pain. EXAM: CHEST  1 VIEW COMPARISON:  Chest x-ray 11/17/2022. FINDINGS: Left-sided single-lumen Port-A-Cath with tip terminating near the cavoatrial junction. Lung volumes are slightly low. Diffuse interstitial prominence and widespread peribronchial cuffing with an overall reticulonodular appearance to the lungs which is very similar to the prior study. No new acute consolidative airspace disease. No pleural effusions. No pneumothorax. No evidence of pulmonary edema. Heart size is normal. Upper mediastinal contours are  within normal limits. IMPRESSION: 1. No acute findings in the thorax. 2. Chronic parenchymal changes in the lungs suggestive of chronic bronchitis and probable multifocal chronic postinfectious or inflammatory scarring, similar to prior studies, as above. 3. Aortic atherosclerosis. Electronically Signed   By: Trudie Reed M.D.   On: 12/15/2022 12:26  DG Tibia/Fibula Left  Result Date: 12/15/2022 CLINICAL DATA:  89 year old male with history of trauma from a fall. Left leg pain. EXAM: LEFT TIBIA AND FIBULA - 2 VIEW COMPARISON:  No priors. FINDINGS: Four views of the left leg demonstrate no acute displaced fracture of the tibia or fibula. Numerous vascular calcifications are noted. IMPRESSION: 1. No acute radiographic abnormality of the left tibia or fibula. 2. Atherosclerosis. Electronically Signed   By: Trudie Reed M.D.   On: 12/15/2022 12:22   DG Ankle Complete Right  Result Date: 12/15/2022 CLINICAL DATA:  89 year old male with history of trauma from a fall complaining of left ankle pain. EXAM: RIGHT ANKLE - COMPLETE 3+ VIEW COMPARISON:  No priors. FINDINGS: There is no evidence of fracture, dislocation, or joint effusion. Degenerative changes of osteoarthritis are noted at the tibiotalar joint and throughout the visualized portions of the midfoot and hindfoot. Numerous vascular calcifications are noted. Soft tissues are otherwise unremarkable. IMPRESSION: 1. No acute radiographic abnormality of the right ankle. 2. Osteoarthritis, as above. 3. Atherosclerosis. Electronically Signed   By: Trudie Reed M.D.   On: 12/15/2022 12:22    Pending Labs Unresulted Labs (From admission, onward)     Start     Ordered   12/15/22 1611  MRSA Next Gen by PCR, Nasal  Once,   R        12/15/22 1611   12/15/22 1551  C-reactive protein  Add-on,   AD        12/15/22 1550   12/15/22 1508  Urinalysis, w/ Reflex to Culture (Infection Suspected) -Urine, Clean Catch  Once,   URGENT       Question:  Specimen  Source  Answer:  Urine, Clean Catch   12/15/22 1507   Signed and Held  Comprehensive metabolic panel  Tomorrow morning,   R        Signed and Held   Signed and Held  CBC with Differential/Platelet  Tomorrow morning,   R        Signed and Held   Signed and Held  Uric acid  Tomorrow morning,   R        Signed and Held            Vitals/Pain Today's Vitals   12/15/22 1126 12/15/22 1127 12/15/22 1129 12/15/22 1230  BP:   (!) 195/75 (!) 209/89  Pulse:   89 (!) 111  Resp:  17    Temp:  98.3 F (36.8 C)    TempSrc:  Oral    SpO2:   98% 98%  Weight: 126 lb 3.1 oz (57.2 kg)     Height: 5\' 11"  (1.803 m)     PainSc:        Isolation Precautions No active isolations  Medications Medications  ondansetron (ZOFRAN) injection 4 mg (4 mg Intravenous Not Given 12/15/22 1353)  ceFAZolin (ANCEF) IVPB 1 g/50 mL premix (has no administration in time range)  acetaminophen (TYLENOL) tablet 1,000 mg (has no administration in time range)  cefTRIAXone (ROCEPHIN) 1 g in sodium chloride 0.9 % 100 mL IVPB (0 g Intravenous Stopped 12/15/22 1527)  iohexol (OMNIPAQUE) 300 MG/ML solution 100 mL (100 mLs Intravenous Contrast Given 12/15/22 1436)  lactated ringers bolus 500 mL (0 mLs Intravenous Stopped 12/15/22 1726)    Mobility non-ambulatory     Focused Assessments Cardiac Assessment Handoff:    Lab Results  Component Value Date   CKTOTAL 266 11/17/2022   CKMB 2.9 09/06/2009   TROPONINI <0.03 01/02/2017  Lab Results  Component Value Date   DDIMER 1.15 (H) 11/17/2022   Does the Patient currently have chest pain? No    R Recommendations: See Admitting Provider Note  Report given to:   Additional Notes: Not ambulatory at this time, but is at baseline. Acute AMS (according to wife). Incontinent 18ga RAC.

## 2022-12-15 NOTE — Subjective & Objective (Addendum)
CC: confusion, left leg pain HPI: 89 year old male history of multiple myeloma, hypertension, coronary disease, hyperlipidemia, chronic hyponatremia, hypothyroidism, BPH, history of sarcoidosis who presents to the ER today with history of left lower leg pain and a rash.  Wife states the patient was outside yesterday working in the yard.  He was wearing socks and shoes.  She noticed the patient having pain in his leg.  He was brought to the ER.  He was noted to have a rash and some left ankle swelling.  X-rays are negative.  Wife states the patient has been confused since he was admitted to the hospital back in July for pneumonia.  Wife states that he has not been "right" since he was discharged.  Patient recently received chemotherapy with IV Velcade and IV Darzalex on 12/12/2022 for his history of multiple myeloma.  Arrival temp 98.3 blood pressure 195/75 satting 90% on room air.  White count 7.2, hemoglobin 10.9, platelets 81,000  Sodium 132, potassium 3.5, bicarb 26, BUN of 27, creatinine 0.87  Lactic acid 2.3  Blood cultures x 2 obtained  Left ankle x-rays were negative for acute fractures.  CT head showed no acute intracranial abnormality.  CT abdomen pelvis demonstrated a continued left lower lobe masslike opacity at the periphery that was seen also in July 2024.  Patient given 1 dose of IV Rocephin and 500 cc of lactated Ringer's  [facility] hospitalist consulted.

## 2022-12-15 NOTE — ED Notes (Signed)
Transport delayed due to staffing of med-surg unit.

## 2022-12-15 NOTE — Assessment & Plan Note (Signed)
Verified DNR/DNI status with pt's wife at bedside

## 2022-12-16 ENCOUNTER — Observation Stay (HOSPITAL_COMMUNITY): Payer: Medicare Other

## 2022-12-16 DIAGNOSIS — J45909 Unspecified asthma, uncomplicated: Secondary | ICD-10-CM | POA: Diagnosis present

## 2022-12-16 DIAGNOSIS — R7881 Bacteremia: Secondary | ICD-10-CM | POA: Diagnosis present

## 2022-12-16 DIAGNOSIS — D860 Sarcoidosis of lung: Secondary | ICD-10-CM | POA: Diagnosis present

## 2022-12-16 DIAGNOSIS — I110 Hypertensive heart disease with heart failure: Secondary | ICD-10-CM | POA: Diagnosis present

## 2022-12-16 DIAGNOSIS — Z741 Need for assistance with personal care: Secondary | ICD-10-CM | POA: Diagnosis present

## 2022-12-16 DIAGNOSIS — D638 Anemia in other chronic diseases classified elsewhere: Secondary | ICD-10-CM | POA: Diagnosis present

## 2022-12-16 DIAGNOSIS — G9341 Metabolic encephalopathy: Secondary | ICD-10-CM | POA: Diagnosis present

## 2022-12-16 DIAGNOSIS — K219 Gastro-esophageal reflux disease without esophagitis: Secondary | ICD-10-CM | POA: Diagnosis present

## 2022-12-16 DIAGNOSIS — C9000 Multiple myeloma not having achieved remission: Secondary | ICD-10-CM | POA: Diagnosis present

## 2022-12-16 DIAGNOSIS — F0390 Unspecified dementia without behavioral disturbance: Secondary | ICD-10-CM | POA: Diagnosis present

## 2022-12-16 DIAGNOSIS — H409 Unspecified glaucoma: Secondary | ICD-10-CM | POA: Diagnosis present

## 2022-12-16 DIAGNOSIS — E43 Unspecified severe protein-calorie malnutrition: Secondary | ICD-10-CM | POA: Diagnosis present

## 2022-12-16 DIAGNOSIS — R1310 Dysphagia, unspecified: Secondary | ICD-10-CM | POA: Diagnosis present

## 2022-12-16 DIAGNOSIS — D693 Immune thrombocytopenic purpura: Secondary | ICD-10-CM | POA: Diagnosis present

## 2022-12-16 DIAGNOSIS — B9689 Other specified bacterial agents as the cause of diseases classified elsewhere: Secondary | ICD-10-CM | POA: Diagnosis present

## 2022-12-16 DIAGNOSIS — T80211A Bloodstream infection due to central venous catheter, initial encounter: Secondary | ICD-10-CM | POA: Diagnosis present

## 2022-12-16 DIAGNOSIS — N400 Enlarged prostate without lower urinary tract symptoms: Secondary | ICD-10-CM | POA: Diagnosis present

## 2022-12-16 DIAGNOSIS — E871 Hypo-osmolality and hyponatremia: Secondary | ICD-10-CM | POA: Diagnosis present

## 2022-12-16 DIAGNOSIS — G9340 Encephalopathy, unspecified: Secondary | ICD-10-CM | POA: Diagnosis present

## 2022-12-16 DIAGNOSIS — H3530 Unspecified macular degeneration: Secondary | ICD-10-CM | POA: Diagnosis present

## 2022-12-16 DIAGNOSIS — I10 Essential (primary) hypertension: Secondary | ICD-10-CM | POA: Diagnosis present

## 2022-12-16 DIAGNOSIS — E782 Mixed hyperlipidemia: Secondary | ICD-10-CM | POA: Diagnosis present

## 2022-12-16 DIAGNOSIS — D539 Nutritional anemia, unspecified: Secondary | ICD-10-CM | POA: Diagnosis present

## 2022-12-16 DIAGNOSIS — T451X5A Adverse effect of antineoplastic and immunosuppressive drugs, initial encounter: Secondary | ICD-10-CM | POA: Diagnosis present

## 2022-12-16 DIAGNOSIS — Z471 Aftercare following joint replacement surgery: Secondary | ICD-10-CM | POA: Diagnosis present

## 2022-12-16 DIAGNOSIS — I2510 Atherosclerotic heart disease of native coronary artery without angina pectoris: Secondary | ICD-10-CM | POA: Diagnosis present

## 2022-12-16 DIAGNOSIS — D6481 Anemia due to antineoplastic chemotherapy: Secondary | ICD-10-CM | POA: Diagnosis present

## 2022-12-16 DIAGNOSIS — D696 Thrombocytopenia, unspecified: Secondary | ICD-10-CM | POA: Diagnosis present

## 2022-12-16 DIAGNOSIS — Z66 Do not resuscitate: Secondary | ICD-10-CM | POA: Diagnosis present

## 2022-12-16 DIAGNOSIS — R41841 Cognitive communication deficit: Secondary | ICD-10-CM | POA: Diagnosis present

## 2022-12-16 DIAGNOSIS — E7800 Pure hypercholesterolemia, unspecified: Secondary | ICD-10-CM | POA: Diagnosis present

## 2022-12-16 DIAGNOSIS — M009 Pyogenic arthritis, unspecified: Secondary | ICD-10-CM | POA: Diagnosis not present

## 2022-12-16 DIAGNOSIS — D61818 Other pancytopenia: Secondary | ICD-10-CM | POA: Diagnosis present

## 2022-12-16 DIAGNOSIS — I509 Heart failure, unspecified: Secondary | ICD-10-CM | POA: Diagnosis present

## 2022-12-16 DIAGNOSIS — R601 Generalized edema: Secondary | ICD-10-CM | POA: Diagnosis not present

## 2022-12-16 DIAGNOSIS — R278 Other lack of coordination: Secondary | ICD-10-CM | POA: Diagnosis present

## 2022-12-16 DIAGNOSIS — D84821 Immunodeficiency due to drugs: Secondary | ICD-10-CM | POA: Diagnosis present

## 2022-12-16 DIAGNOSIS — M6281 Muscle weakness (generalized): Secondary | ICD-10-CM | POA: Diagnosis present

## 2022-12-16 DIAGNOSIS — M25572 Pain in left ankle and joints of left foot: Secondary | ICD-10-CM | POA: Diagnosis not present

## 2022-12-16 DIAGNOSIS — E039 Hypothyroidism, unspecified: Secondary | ICD-10-CM | POA: Diagnosis present

## 2022-12-16 DIAGNOSIS — E222 Syndrome of inappropriate secretion of antidiuretic hormone: Secondary | ICD-10-CM | POA: Diagnosis present

## 2022-12-16 DIAGNOSIS — Y838 Other surgical procedures as the cause of abnormal reaction of the patient, or of later complication, without mention of misadventure at the time of the procedure: Secondary | ICD-10-CM | POA: Diagnosis present

## 2022-12-16 DIAGNOSIS — L03116 Cellulitis of left lower limb: Secondary | ICD-10-CM | POA: Diagnosis present

## 2022-12-16 LAB — URINALYSIS, W/ REFLEX TO CULTURE (INFECTION SUSPECTED)
Bacteria, UA: NONE SEEN
Bilirubin Urine: NEGATIVE
Glucose, UA: NEGATIVE mg/dL
Ketones, ur: NEGATIVE mg/dL
Leukocytes,Ua: NEGATIVE
Nitrite: NEGATIVE
Protein, ur: 100 mg/dL — AB
Specific Gravity, Urine: 1.021 (ref 1.005–1.030)
pH: 6 (ref 5.0–8.0)

## 2022-12-16 MED ORDER — GADOBUTROL 1 MMOL/ML IV SOLN
5.0000 mL | Freq: Once | INTRAVENOUS | Status: AC | PRN
  Administered 2022-12-16: 5 mL via INTRAVENOUS

## 2022-12-16 MED ORDER — SODIUM CHLORIDE 0.9 % IV SOLN
2.0000 g | INTRAVENOUS | Status: DC
  Administered 2022-12-16 – 2022-12-18 (×3): 2 g via INTRAVENOUS
  Filled 2022-12-16 (×3): qty 20

## 2022-12-16 NOTE — Progress Notes (Signed)
   12/16/22 1036  TOC Brief Assessment  Insurance and Status Reviewed  Patient has primary care physician Yes  Home environment has been reviewed from home  Prior level of function: independent  Prior/Current Home Services No current home services  Social Determinants of Health Reivew SDOH reviewed no interventions necessary  Readmission risk has been reviewed Yes  Transition of care needs no transition of care needs at this time    Transition of Care Department [facility]) has reviewed patient and no TOC needs have been identified at this time. We will continue to monitor patient advancement through interdisciplinary progression rounds. If new patient transition needs arise, please place a TOC consult.

## 2022-12-16 NOTE — Progress Notes (Signed)
[facility] PROGRESS NOTE  RAYON RUDKIN (DOB: [birth year]) MVH:[id number] PCP: Elfredia Nevins, MD  Brief Narrative: Dale [name] is a 89 y.o. male with a history of multiple myeloma undergoing chemotherapy, ITP, CAD, HTN, HLD, hyponatremia, hypothyroidism, BPH who presented to the ED on 12/15/2022 with confusion found to have suspected cellulitis of the lower left leg/ankle.   Subjective: Pain in left ankle is stable, pt denies chest pain or shortness of breath. He's a limited historian and I got voicemail when attempting to reach spouse by phone.  Objective: BP (!) 144/61   Pulse 71   Temp 98.1 F (36.7 C) (Oral)   Resp 18   Ht 5\' 11"  (1.803 m)   Wt 56.8 kg   SpO2 95%   BMI 17.46 kg/m   Gen: Elderly, frail male in no acute distress Pulm: Clear, nonlabored  CV: RRR, no MRG, no pitting edema GI: Soft, NT, ND, +BS Neuro: Alert, incompletely oriented. No new focal deficits. Ext: Warm, no deformities, decreased muscle bulk Skin: Left lower leg with blanchable tender erythema involving the posterior and both medial and lateral surfaces in lower calf and edema to the ankle with painful PROM.   CT abd/pelvis: 1. Mild wall thickening of the proximal to mid transverse colon, which may be secondary to underdistention or mild colitis. 2. Extensive sigmoid diverticulosis without evidence of acute diverticulitis. 3. Mass-like opacity at the periphery of the left lower lobe measures approximately 4.6 x 1.3 x 2.6 cm, previously measured approximately 3.9 x 2.0 x 2.6 cm. Consider one of the following in 3 months for both low-risk and high-risk individuals: (a) repeat chest CT, (b) follow-up PET-CT, or (c) tissue sampling.   Assessment & Plan: Left ankle cellulitis, GNR bacteremia:  - Continued ancef, No evidence of purulence at this time so holding MRSA coverage. Abrupt onset may be due to insect bite/sting though no punctum is noted.  - ESR 70, CRP 0.7, PCT 0.42. Unclear  significance of these tests in setting of malignancy undergoing active chemotherapy.  - Pt has worsening leukocytosis (noted steroid administered 8/8 with chemotherapy, decadron also on med list) and cellulitis overlaying a joint. He's immunosuppressed due to chemotherapy. ?ankle joint seeding w/bacteremia. Will check MRI.  - Monitor blood cultures - GNRs in aerobic bottle of one collection. Broaden to CTX 2g IV q24h and await further data. Urinalysis has still not been collected, reordered with reflex to culture, requested RN to collect ASAP. CT also made note of proximal-mid transverse colon wall thickening though pt has no symptoms of colitis at this time.  Acute metabolic encephalopathy: With acute and more subacute components, possible neurodegenerative disease/dementia which puts him at higher risk of delirium with infections, etc. CT head with cerebral volume loss and small vessel ischemic changes without acute findings. - Delirium precautions - Treat infectious etiology as above and monitor. - ?paraneoplastic w/pulmonary opacity - Continue with plans for outpatient neurology evaluation  Left lower lobe pulmonary opacity (4.6 x 1.3 x 2.6cm):  - This appears grossly smaller than opacity November 17, 2022 which was treated with antibiotics. Would recommend repeat CT chest in 3 months (vs. PET vs. tissue sampling [which would be high risk w/thrombocytopenia])  Hyponatremia: Chronic. ?SIADH w/pulmonary opacity.  - Fluid restriction.   Pancytopenia, macrocytic anemia, thrombocytopenia: Has history of ITP.  - Monitor daily. Check IPF.  Multiple myeloma: Undergoing chemotherapy, last infusion 8/8.  - Follows up with Dr. Ellin Saba - Continue prophylactic acyclovir unless renal function is worsening.  CAD:  -  Continue long acting nitrate, BB, ASA, statin  HTN:  - Continue home imdur, metoprolol (ECG at admission w/sinus tachycardia, telemetry showing NSR and 1st deg AVB).   Hypothyroidism:   - Continue synthroid  - Recheck TSH in light of hyponatremia.   HLD:  - Continue statin  DNR: POA  BPH:  - Check PVR - Med rec pending.   Tyrone Nine, MD [facility] [email or web address] 12/16/2022, 10:57 AM

## 2022-12-16 NOTE — Progress Notes (Signed)
Gave patient Tylenol for fever of 100.8, patient got choked up and had a hard time getting them down. MD informed of possible aspiration.   Patients wife informed me that patient was a&o x4 before a hospital stay in July of this year, patient was able to run errands, and do all tasks himself and since then he has not been the same. She states since she took him home he does not know where anything in the house is at and cannot do anything by himself anymore. Wife has her own caretaker and stated if he was not going to return to his normal state she will not be able to care for him at home so he may need placement. MD and Nicholos Johns made aware.

## 2022-12-16 NOTE — Progress Notes (Signed)
Date and time results received: 12/16/22 1331 (use smartphrase ".now" to insert current time)  Test: Aerobic bottle blood cultures  Critical Value: Gram negative rods  Name of Provider Notified: Jarvis Newcomer

## 2022-12-17 ENCOUNTER — Ambulatory Visit: Payer: Medicare Other | Admitting: Urology

## 2022-12-17 DIAGNOSIS — L03116 Cellulitis of left lower limb: Secondary | ICD-10-CM | POA: Diagnosis not present

## 2022-12-17 DIAGNOSIS — N401 Enlarged prostate with lower urinary tract symptoms: Secondary | ICD-10-CM

## 2022-12-17 DIAGNOSIS — R351 Nocturia: Secondary | ICD-10-CM

## 2022-12-17 DIAGNOSIS — Z85528 Personal history of other malignant neoplasm of kidney: Secondary | ICD-10-CM

## 2022-12-17 LAB — CBC
HCT: 29.4 % — ABNORMAL LOW (ref 39.0–52.0)
Hemoglobin: 9.6 g/dL — ABNORMAL LOW (ref 13.0–17.0)
MCH: 36.6 pg — ABNORMAL HIGH (ref 26.0–34.0)
MCHC: 32.7 g/dL (ref 30.0–36.0)
MCV: 112.2 fL — ABNORMAL HIGH (ref 80.0–100.0)
Platelets: 73 10*3/uL — ABNORMAL LOW (ref 150–400)
RBC: 2.62 MIL/uL — ABNORMAL LOW (ref 4.22–5.81)
RDW: 15.9 % — ABNORMAL HIGH (ref 11.5–15.5)
WBC: 9.1 10*3/uL (ref 4.0–10.5)
nRBC: 0 % (ref 0.0–0.2)

## 2022-12-17 NOTE — Plan of Care (Signed)
  Problem: Acute Rehab PT Goals(only PT should resolve) Goal: Pt Will Go Supine/Side To Sit Outcome: Progressing Flowsheets (Taken 12/17/2022 0945) Pt will go Supine/Side to Sit:  with minimal assist  with contact guard assist Goal: Patient Will Transfer Sit To/From Stand Outcome: Progressing Flowsheets (Taken 12/17/2022 0945) Patient will transfer sit to/from stand:  with contact guard assist  with minimal assist Goal: Pt Will Transfer Bed To Chair/Chair To Bed Outcome: Progressing Flowsheets (Taken 12/17/2022 0945) Pt will Transfer Bed to Chair/Chair to Bed:  with contact guard assist  with min assist Goal: Pt Will Ambulate Outcome: Progressing Flowsheets (Taken 12/17/2022 0945) Pt will Ambulate:  15 feet  with minimal assist     [facility] [employer], DPT Program

## 2022-12-17 NOTE — Progress Notes (Signed)
Wife and caretaker at bedside. Concerned that they will not be able to take care of patient in current condition and wanted to talk with social worker to for possible SNF rehabilitation. Reached out to Child psychotherapist and MD notified.

## 2022-12-17 NOTE — Evaluation (Addendum)
Physical Therapy Evaluation Patient Details Name: Dale [name] MRN: [id number] DOB: 02/02/1929 Today's Date: 12/17/2022  History of Present Illness  Dale [name] is a 89 y.o. male with a history of multiple myeloma undergoing chemotherapy, ITP, CAD, HTN, HLD, hyponatremia, hypothyroidism, BPH who presented to the ED on 12/15/2022 with confusion found to have suspected cellulitis of the lower left leg/ankle.   Clinical Impression  [name] presented as alert and oriented. Required min assist to perform bed mobility and min/mod to transfer from bed to chair using RW. Ambulation limited to short shuffling steps forward and backward at bedside due to pain in LLE and LE weakness. Pt presented on room air and remained on room air throughout. Patient will benefit from continued skilled physical therapy in hospital and recommended venue below to increase strength, balance, endurance for safe ADLs and gait.       If plan is discharge home, recommend the following: A lot of help with walking and/or transfers;A little help with bathing/dressing/bathroom;Assistance with cooking/housework;Assist for transportation;Help with stairs or ramp for entrance   Can travel by private vehicle        Equipment Recommendations    Recommendations for Other Services       Functional Status Assessment Patient has had a recent decline in their functional status and demonstrates the ability to make significant improvements in function in a reasonable and predictable amount of time.     Precautions / Restrictions Precautions Precautions: Fall Restrictions Weight Bearing Restrictions: No      Mobility  Bed Mobility Overal bed mobility: Needs Assistance Bed Mobility: Sidelying to Sit, Supine to Sit   Sidelying to sit: Min assist Supine to sit: Min assist     General bed mobility comments: Required assist to sit upright, HOB raised    Transfers Overall transfer level: Needs assistance Equipment used:  Rolling walker (2 wheels) Transfers: Sit to/from Stand, Bed to chair/wheelchair/BSC Sit to Stand: Min assist           General transfer comment: RW    Ambulation/Gait Ambulation/Gait assistance: Min assist Gait Distance (Feet): 8 Feet (Shuffling steps taken forward and backward at bedside) Assistive device: Rolling walker (2 wheels) Gait Pattern/deviations: Step-through pattern, Decreased step length - right, Decreased step length - left, Decreased stride length Gait velocity: decreased     General Gait Details: Short shuffling steps, slightly labored, RW  Stairs            Wheelchair Mobility     Tilt Bed    Modified Rankin (Stroke Patients Only)       Balance Overall balance assessment: Needs assistance Sitting-balance support: No upper extremity supported, Feet supported Sitting balance-Leahy Scale: Fair Sitting balance - Comments: sitting EOB   Standing balance support: Bilateral upper extremity supported, During functional activity, Reliant on assistive device for balance Standing balance-Leahy Scale: Poor Standing balance comment: RW                             Pertinent Vitals/Pain Pain Assessment Pain Assessment: Faces Faces Pain Scale: Hurts little more Pain Location: LLE Pain Descriptors / Indicators: Grimacing, Discomfort Pain Intervention(s): Limited activity within patient's tolerance, Monitored during session    Home Living Family/patient expects to be discharged to:: Private residence Living Arrangements: Spouse/significant other Available Help at Discharge: Family;Available PRN/intermittently;Other (Comment) (Has an aid come 5x/week for 3 hours a day) Type of Home: House Home Access: Stairs to enter;Ramped entrance Entrance Stairs-Rails: Right;Left;Can  reach both Entrance Stairs-Number of Steps: 3   Home Layout: One level Home Equipment: Agricultural consultant (2 wheels) Additional Comments: Home assist via person who comes 5  days a week for 3 hours.    Prior Function Prior Level of Function : Needs assist       Physical Assist : Mobility (physical);ADLs (physical) Mobility (physical): Bed mobility;Transfers;Gait;Stairs ADLs (physical): IADLs Mobility Comments: household and short distanced community ambulator using RW PRN. Pt does not drive. ADLs Comments: Independent ADL; assist IADL via person who comes 3 hours a day 5 days a week.     Extremity/Trunk Assessment                Communication   Communication Communication: No apparent difficulties  Cognition Arousal: Alert Behavior During Therapy: WFL for tasks assessed/performed Overall Cognitive Status: Within Functional Limits for tasks assessed                                          General Comments      Exercises     Assessment/Plan    PT Assessment Patient needs continued PT services  PT Problem List Decreased strength;Decreased activity tolerance;Decreased balance;Decreased mobility       PT Treatment Interventions DME instruction;Gait training;Stair training;Functional mobility training;Therapeutic activities;Therapeutic exercise;Balance training    PT Goals (Current goals can be found in the Care Plan section)  Acute Rehab PT Goals Patient Stated Goal: Return home with assist of family PT Goal Formulation: With patient Time For Goal Achievement: 12/31/22 Potential to Achieve Goals: Good    Frequency Min 3X/week     Co-evaluation               AM-PAC PT "6 Clicks" Mobility  Outcome Measure Help needed turning from your back to your side while in a flat bed without using bedrails?: A Little Help needed moving from lying on your back to sitting on the side of a flat bed without using bedrails?: A Little Help needed moving to and from a bed to a chair (including a wheelchair)?: A Little Help needed standing up from a chair using your arms (e.g., wheelchair or bedside chair)?: A Little Help  needed to walk in hospital room?: A Lot Help needed climbing 3-5 steps with a railing? : A Lot 6 Click Score: 16    End of Session   Activity Tolerance: Patient tolerated treatment well;Patient limited by fatigue Patient left: in chair;with call bell/phone within reach Nurse Communication: Mobility status PT Visit Diagnosis: Unsteadiness on feet (R26.81);Other abnormalities of gait and mobility (R26.89);Muscle weakness (generalized) (M62.81);History of falling (Z91.81)    Time: 3086-5784 PT Time Calculation (min) (ACUTE ONLY): 23 min   Charges:   PT Evaluation $PT Eval Moderate Complexity: 1 Mod PT Treatments $Therapeutic Activity: 23-37 mins PT General Charges $$ ACUTE PT VISIT: 1 Visit          [facility] [employer], DPT Program

## 2022-12-17 NOTE — Progress Notes (Signed)
[facility] PROGRESS NOTE  Dale [name] (DOB: [birth year]) OZH:[id number] PCP: Elfredia Nevins, MD  Brief Narrative: Dale [name] is a 89 y.o. male with a history of multiple myeloma undergoing chemotherapy, ITP, CAD, HTN, HLD, hyponatremia, hypothyroidism, BPH who presented to the ED on 12/15/2022 with confusion found to have suspected cellulitis of the lower left leg/ankle. Blood cultures have grown GNRs in aerobic bottles. MRI showed no osteomyelitis or abscess at the ankle, and cellulitis appears to be improving.   Subjective: Pt denies diarrhea or bleeding. No other complaints right now.  Objective: BP (!) 158/73 (BP Location: Right Arm)   Pulse 76   Temp 97.9 F (36.6 C) (Oral)   Resp 16   Ht 5\' 11"  (1.803 m)   Wt 56 kg   SpO2 98%   BMI 17.22 kg/m   Gen: Frail, elderly, thin male in no distress Pulm: Clear, nonlabored  CV: RRR, no MRG or edema GI: Soft, NT, ND, +BS Neuro: Alert and incompletely oriented, slowed. No new focal deficits. Ext: Warm, no deformities Skin: Left posterior ankle with decreased edema and erythema without fluctuance or palpable deformity. Improved ROM at ankle today. Left earlobe deformity stable, chronic. No new rashes, lesions or ulcers on visualized skin   CT abd/pelvis: 1. Mild wall thickening of the proximal to mid transverse colon, which may be secondary to underdistention or mild colitis. 2. Extensive sigmoid diverticulosis without evidence of acute diverticulitis. 3. Mass-like opacity at the periphery of the left lower lobe measures approximately 4.6 x 1.3 x 2.6 cm, previously measured approximately 3.9 x 2.0 x 2.6 cm. Consider one of the following in 3 months for both low-risk and high-risk individuals: (a) repeat chest CT, (b) follow-up PET-CT, or (c) tissue sampling.   Assessment & Plan: Left ankle cellulitis, GNR bacteremia:  - Continued ceftriaxone 2g IV q24h. No rapid ID on cultures but 2 aerobic bottles of 2 collections  growing GNRs. Will follow for speciation. Afebrile since noon yesterday. Cellulitis looks to be improving. MRI ruled out osteomyelitis or fluid collection. Uric acid 3.7.  - ESR 70, CRP 0.7, PCT 0.42. Unclear significance of these tests in setting of malignancy undergoing active chemotherapy.  -Urinalysis collected > 24 hours after antibiotics started, sterile. CT made note of proximal-mid transverse colon wall thickening though pt has no symptoms of colitis at this time. Benign abd exam currently.  Acute metabolic encephalopathy: With acute and more subacute components, possible neurodegenerative disease/dementia which puts him at higher risk of delirium with infections, etc. CT head with cerebral volume loss and small vessel ischemic changes without acute findings. - Delirium precautions - Treat infectious etiology as above and monitor. - ?paraneoplastic w/pulmonary opacity - Continue with plans for outpatient neurology evaluation  Left lower lobe pulmonary opacity (4.6 x 1.3 x 2.6cm):  - This appears grossly smaller than opacity November 17, 2022 which was treated with antibiotics. Would recommend repeat CT chest in 3 months (vs. PET vs. tissue sampling [which would be high risk w/thrombocytopenia])  Hyponatremia: Chronic. ?SIADH w/pulmonary opacity.  - Fluid restriction.   Pancytopenia, macrocytic anemia, thrombocytopenia: Has history of ITP.  - Monitor daily. 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   Multiple myeloma: Undergoing chemotherapy, last infusion 8/8.  - Follows up with Dr. Ellin Saba - Continue prophylactic acyclovir unless renal function is worsening.  CAD:  - Continue long acting nitrate, BB, ASA, statin  HTN:  - Continue home imdur, metoprolol (ECG at admission w/sinus tachycardia, telemetry showing NSR and 1st deg AVB).  Hypothyroidism:  - Continue synthroid  - Recheck TSH in light of hyponatremia.   HLD:  - Continue statin  DNR: POA  BPH: Not retaining on PVR. - Med rec  pending.   Tyrone Nine, MD [facility] [email or web address] 12/17/2022, 9:29 AM

## 2022-12-17 NOTE — [place] FL2 (Signed)
[place] MEDICAID FL2 LEVEL OF CARE FORM     IDENTIFICATION  Patient Name: Dale [name] Birthdate: 08-15-1928 Sex: male Admission Date (Current Location): 12/15/2022  [facility] and [place] Number:  [employer] and Address:  [facility],  [address], [place] [address]      Provider Number: [id number]  Attending Physician Name and Address:  Tyrone Nine, MD  Relative Name and Phone Number:       Current Level of Care: Hospital Recommended Level of Care: Skilled Nursing Facility Prior Approval Number:    Date Approved/Denied:   PASRR Number: [id number]  Discharge Plan: SNF    Current Diagnoses: Patient Active Problem List   Diagnosis Date Noted   Gram-negative bacteremia 12/16/2022   Cellulitis of left ankle 12/15/2022   DNR (do not resuscitate)/DNI(Do Not Intubate) 12/15/2022   Macrocytic anemia 06/04/2022   Long term (current) use of aspirin    Anemia associated with chemotherapy 01/16/2022   Protein-calorie malnutrition, severe (HCC) 07/29/2021   Chronic hyponatremia - baseline 125-134 07/29/2021   Pancytopenia (HCC) 07/29/2021   Acute metabolic encephalopathy 07/29/2021   Multiple myeloma (HCC) 01/15/2021   Hypoalbuminemia due to protein-calorie malnutrition (HCC) 12/07/2020   Anemia of chronic disease 05/21/2017   Weight loss of more than 10% body weight 01/27/2017   Sinusitis 01/02/2017   Left renal mass 05/17/2016   Hiatal hernia    Schatzki's ring    Odynophagia 03/03/2015   Easy bruisability 09/26/2014   Mixed hyperlipidemia 06/28/2014   First degree AV block 10/08/2013   CAD, RCA stent, RCA new DES July 2009, Cath [place] 2011, 02/02/12 02/02/2012   Unstable angina, cath showed patent stents 02/02/12 02/02/2012   Thrombocytopenia (HCC) 02/02/2012   BPH (benign prostatic hyperplasia) 02/02/2012   Macular degeneration 08/15/2011   History of ITP 08/15/2011   DIARRHEA 06/21/2010   HEMATOCHEZIA 01/19/2009   DYSPHAGIA  UNSPECIFIED 01/19/2009   Acquired hypothyroidism 01/18/2009   HYPERCHOLESTEROLEMIA 01/18/2009   Unspecified glaucoma 01/18/2009   Essential hypertension 01/18/2009   CHF 01/18/2009   SCHATZKI'S RING 01/18/2009   NEPHROLITHIASIS 01/18/2009   PUD, HX OF 01/18/2009   Sarcoidosis 08/27/2007   Coronary atherosclerosis 08/27/2007   Seasonal and perennial allergic rhinitis 08/27/2007   Allergic asthma, mild intermittent, uncomplicated 08/27/2007    Orientation RESPIRATION BLADDER Height & Weight     Self, Situation, Place  Normal (See D/C summary) Continent Weight: 123 lb 7.3 oz (56 kg) Height:  5\' 11"  (180.3 cm)  BEHAVIORAL SYMPTOMS/MOOD NEUROLOGICAL BOWEL NUTRITION STATUS      Continent Diet (Regular)  AMBULATORY STATUS COMMUNICATION OF NEEDS Skin   Extensive Assist Verbally Normal                       Personal Care Assistance Level of Assistance  Bathing, Feeding, Dressing Bathing Assistance: Limited assistance Feeding assistance: Independent Dressing Assistance: Limited assistance     Functional Limitations Info  Sight, Hearing, Speech Sight Info: Impaired Hearing Info: Impaired Speech Info: Adequate    SPECIAL CARE FACTORS FREQUENCY  PT (By licensed PT), OT (By licensed OT)     PT Frequency: 5 times weekly OT Frequency: 5 times weekly            Contractures Contractures Info: Not present    Additional Factors Info  Code Status, Allergies Code Status Info: DNR Allergies Info: Azithromycin, Doxazosin, Atenolol, Hydrocodone, Levofloxacin, Morphine, Penicillins, Sulfonamide Derivatives           Current  Medications (12/17/2022):  This is the current hospital active medication list Current Facility-Administered Medications  Medication Dose Route Frequency Provider Last Rate Last Admin   acetaminophen (TYLENOL) tablet 650 mg  650 mg Oral Q6H PRN Carollee Herter, DO   650 mg at 12/16/22 2240   Or   acetaminophen (TYLENOL) suppository 650 mg  650 mg Rectal Q6H  PRN Carollee Herter, DO       acyclovir (ZOVIRAX) tablet 400 mg  400 mg Oral BID Carollee Herter, DO   400 mg at 12/17/22 0844   aspirin EC tablet 81 mg  81 mg Oral Q breakfast Carollee Herter, DO   81 mg at 12/17/22 0844   atorvastatin (LIPITOR) tablet 40 mg  40 mg Oral Daily Carollee Herter, DO   40 mg at 12/17/22 0844   cefTRIAXone (ROCEPHIN) 2 g in sodium chloride 0.9 % 100 mL IVPB  2 g Intravenous Q24H Hazeline Junker B, MD 200 mL/hr at 12/16/22 1643 2 g at 12/16/22 1643   isosorbide mononitrate (IMDUR) 24 hr tablet 60 mg  60 mg Oral BID Carollee Herter, DO   60 mg at 12/17/22 0843   levothyroxine (SYNTHROID) tablet 75 mcg  75 mcg Oral QAC breakfast Carollee Herter, DO   75 mcg at 12/17/22 2956   metoprolol tartrate (LOPRESSOR) tablet 25 mg  25 mg Oral BID Carollee Herter, DO   25 mg at 12/17/22 0843   ondansetron (ZOFRAN) injection 4 mg  4 mg Intravenous Once Carollee Herter, DO       ondansetron [facility]) tablet 4 mg  4 mg Oral Q6H PRN Carollee Herter, DO       Or   ondansetron [facility]) injection 4 mg  4 mg Intravenous Q6H PRN Carollee Herter, DO       pantoprazole (PROTONIX) EC tablet 40 mg  40 mg Oral Daily Carollee Herter, DO   40 mg at 12/17/22 2130   Facility-Administered Medications Ordered in Other Encounters  Medication Dose Route Frequency Provider Last Rate Last Admin   acetaminophen (TYLENOL) 325 MG tablet            diphenhydrAMINE (BENADRYL) 25 mg capsule            montelukast (SINGULAIR) 10 MG tablet              Discharge Medications: Please see discharge summary for a list of discharge medications.  Relevant Imaging Results:  Relevant Lab Results:   Additional Information SSN: [id number] [place]

## 2022-12-17 NOTE — TOC Initial Note (Signed)
Transition of Care [facility]) - Initial/Assessment Note    Patient Details  Name: Dale [name] MRN: [id number] Date of Birth: 1928-09-23  Transition of Care [facility]) CM/SW Contact:    Elliot Gault, LCSW Phone Number: 12/17/2022, 12:56 PM  Clinical Narrative:                  Pt admitted from home. PT recommending SNF rehab at dc. Spoke with pt's wife at bedside to review. Pt not fully oriented at this time. Pt's wife agreeable to SNF rehab. CMS provider option reviewed. Will refer as requested.  TOC will follow.  Expected Discharge Plan: Skilled Nursing Facility Barriers to Discharge: Continued Medical Work up   Patient Goals and CMS Choice Patient states their goals for this hospitalization and ongoing recovery are:: reahb CMS Medicare.gov Compare Post Acute Care list provided to:: Patient Represenative (must comment) Choice offered to / list presented to : Spouse [place] ownership interest in [facility].provided to:: Spouse    Expected Discharge Plan and Services In-house Referral: Clinical Social Work   Post Acute Care Choice: Skilled Nursing Facility Living arrangements for the past 2 months: Single Family Home                                      Prior Living Arrangements/Services Living arrangements for the past 2 months: Single Family Home Lives with:: Spouse Patient language and need for interpreter reviewed:: Yes Do you feel safe going back to the place where you live?: Yes      Need for Family Participation in Patient Care: Yes (Comment) Care giver support system in place?: Yes (comment)   Criminal Activity/Legal Involvement Pertinent to Current Situation/Hospitalization: No - Comment as needed  Activities of Daily Living Home Assistive Devices/Equipment: None ADL Screening (condition at time of admission) Patient's cognitive ability adequate to safely complete daily activities?: Yes Is the patient deaf or have difficulty hearing?:  Yes Does the patient have difficulty seeing, even when wearing glasses/contacts?: No Does the patient have difficulty concentrating, remembering, or making decisions?: No Patient able to express need for assistance with ADLs?: Yes Does the patient have difficulty dressing or bathing?: No Independently performs ADLs?: No Communication: Independent Dressing (OT): Independent Grooming: Independent Feeding: Independent Bathing: Needs assistance Is this a change from baseline?: Change from baseline, expected to last >3 days Toileting: Needs assistance Is this a change from baseline?: Change from baseline, expected to last >3days In/Out Bed: Needs assistance Is this a change from baseline?: Change from baseline, expected to last >3 days Walks in Home: Independent Does the patient have difficulty walking or climbing stairs?: No Weakness of Legs: Both Weakness of Arms/Hands: Both  Permission Sought/Granted Permission sought to share information with : Facility Industrial/product designer granted to share information with : Yes, Verbal Permission Granted     Permission granted to share info w AGENCY: [facility]        Emotional Assessment Appearance:: Appears stated age     Orientation: : Oriented to Self, Oriented to Place Alcohol / Substance Use: Not Applicable Psych Involvement: No (comment)  Admission diagnosis:  Acute encephalopathy [G93.40] Left leg cellulitis [L03.116] Cellulitis of left ankle [L03.116] Gram-negative bacteremia [R78.81] Patient Active Problem List   Diagnosis Date Noted   Gram-negative bacteremia 12/16/2022   Cellulitis of left ankle 12/15/2022   DNR (do not resuscitate)/DNI(Do Not Intubate) 12/15/2022   Macrocytic anemia 06/04/2022  Long term (current) use of aspirin    Anemia associated with chemotherapy 01/16/2022   Protein-calorie malnutrition, severe (HCC) 07/29/2021   Chronic hyponatremia - baseline 125-134 07/29/2021   Pancytopenia (HCC)  07/29/2021   Acute metabolic encephalopathy 07/29/2021   Multiple myeloma (HCC) 01/15/2021   Hypoalbuminemia due to protein-calorie malnutrition (HCC) 12/07/2020   Anemia of chronic disease 05/21/2017   Weight loss of more than 10% body weight 01/27/2017   Sinusitis 01/02/2017   Left renal mass 05/17/2016   Hiatal hernia    Schatzki's ring    Odynophagia 03/03/2015   Easy bruisability 09/26/2014   Mixed hyperlipidemia 06/28/2014   First degree AV block 10/08/2013   CAD, RCA stent, RCA new DES July 2009, Cath [place] 2011, 02/02/12 02/02/2012   Unstable angina, cath showed patent stents 02/02/12 02/02/2012   Thrombocytopenia (HCC) 02/02/2012   BPH (benign prostatic hyperplasia) 02/02/2012   Macular degeneration 08/15/2011   History of ITP 08/15/2011   DIARRHEA 06/21/2010   HEMATOCHEZIA 01/19/2009   DYSPHAGIA UNSPECIFIED 01/19/2009   Acquired hypothyroidism 01/18/2009   HYPERCHOLESTEROLEMIA 01/18/2009   Unspecified glaucoma 01/18/2009   Essential hypertension 01/18/2009   CHF 01/18/2009   SCHATZKI'S RING 01/18/2009   NEPHROLITHIASIS 01/18/2009   PUD, HX OF 01/18/2009   Sarcoidosis 08/27/2007   Coronary atherosclerosis 08/27/2007   Seasonal and perennial allergic rhinitis 08/27/2007   Allergic asthma, mild intermittent, uncomplicated 08/27/2007   PCP:  Elfredia Nevins, MD Pharmacy:   [id number] - [place], [place] - [address] [place] [address] Phone: [phone] Fax: [phone]  [facility] [phone] - [place], [place] - [address] AT [facility] [address] [place] [address] Phone: [phone] Fax: [phone]  [facility] - [place], [place] - [address] [place]  [address] Phone: [phone] Fax: [phone]     Social Determinants of Health (SDOH) Social History: SDOH Screenings   Food Insecurity: No Food Insecurity (12/15/2022)  Housing: Low Risk   (12/15/2022)  Transportation Needs: No Transportation Needs (12/15/2022)  Utilities: Not At Risk (12/15/2022)  Financial Resource Strain: Low Risk  (12/02/2022)  Physical Activity: Inactive (11/25/2022)  Social Connections: Unknown (09/18/2021)   Received from [facility]  Stress: No Stress Concern Present (10/16/2021)   Received from [facility]  Tobacco Use: Medium Risk (12/15/2022)   SDOH Interventions:     Readmission Risk Interventions    11/18/2022    3:29 PM 07/30/2021   10:16 AM  Readmission Risk Prevention Plan  Transportation Screening Complete Complete  PCP or Specialist Appt within 3-5 Days Not Complete   HRI or Home Care Consult Complete Complete  Social Work Consult for Recovery Care Planning/Counseling Complete Complete  Palliative Care Screening Not Applicable Not Applicable  Medication Review Oceanographer) Complete Complete

## 2022-12-18 DIAGNOSIS — L03116 Cellulitis of left lower limb: Secondary | ICD-10-CM | POA: Diagnosis not present

## 2022-12-18 DIAGNOSIS — G9341 Metabolic encephalopathy: Secondary | ICD-10-CM | POA: Diagnosis not present

## 2022-12-18 LAB — T4, FREE: Free T4: 0.95 ng/dL (ref 0.61–1.12)

## 2022-12-18 NOTE — Hospital Course (Addendum)
Dale [name] is a 89 y.o. male with a history of multiple myeloma undergoing chemotherapy, ITP, CAD, HTN, HLD, hyponatremia, hypothyroidism, BPH who presented to the ED on 12/15/2022 with confusion found to have suspected cellulitis of the lower left leg/ankle.  Blood cultures have grown GNRs in aerobic bottles. MRI showed no osteomyelitis or abscess at the ankle, and cellulitis appears to be improving.  Blood cultures speciated out to Achromobacter dentrificans.  Case was discussed with infectious disease and he was recommended for having his port removed which took place on 12/20/2022 with general surgery.  Oncology was also made aware of recommendations. Repeat blood cultures were obtained after port removal.

## 2022-12-18 NOTE — Progress Notes (Signed)
Progress Note    Dale [name]   ZOX:[id number]  DOB: [birth year]  DOA: 12/15/2022     2 PCP: Elfredia Nevins, MD  Initial CC: left foot/ankle pain  Hospital Course: Dale [name] is a 89 y.o. male with a history of multiple myeloma undergoing chemotherapy, ITP, CAD, HTN, HLD, hyponatremia, hypothyroidism, BPH who presented to the ED on 12/15/2022 with confusion found to have suspected cellulitis of the lower left leg/ankle.  Blood cultures have grown GNRs in aerobic bottles. MRI showed no osteomyelitis or abscess at the ankle, and cellulitis appears to be improving.   Interval History:  No events overnight.  Still having ongoing pain in his left foot he says.  Assessment and Plan:  Left ankle cellulitis - noted on admission to be erythematous, warm, swollen - MRI foot negative for septic arthritis or osteomyelitis - Continue antibiotics and monitoring clinically  GNR bacteremia - atypical for the cellulitis to be etiology of bacteremia - 2/4 bottles (1 from each set, aerobic bottle) - still awaiting organism ID; not on rapid ID -No abdominal pain complaints but CT abdomen/pelvis on admission was notable for wall thickening of the proximal to mid transverse colon with extensive sigmoid diverticulosis but no diverticulitis-Leukocytosis has improved, okay to continue Rocephin for now.  If any fever or worsening leukocytosis, will need to add on Flagyl   Acute metabolic encephalopathy With acute and more subacute components, possible neurodegenerative disease/dementia which puts him at higher risk of delirium with infections, etc. CT head with cerebral volume loss and small vessel ischemic changes without acute findings. - Delirium precautions - Treat infectious etiology as above and monitor. - ?paraneoplastic w/pulmonary opacity - Continue with plans for outpatient neurology evaluation   Left lower lobe pulmonary opacity (4.6 x 1.3 x 2.6cm) - This appears grossly smaller than  opacity November 17, 2022 which was treated with antibiotics. Would recommend repeat CT chest in 3 months (vs. PET vs. tissue sampling [which would be high risk w/thrombocytopenia])   Hyponatremia: Chronic. ?SIADH w/pulmonary opacity - Fluid restriction.    Pancytopenia, macrocytic anemia, thrombocytopenia: Has history of ITP - Monitor daily. 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    Multiple myeloma: Undergoing chemotherapy, last infusion 8/8 - Follows up with Dr. Ellin Saba - Continue prophylactic acyclovir unless renal function worsens    CAD  - Continue long acting nitrate, BB, ASA, statin   HTN - Continue home imdur, metoprolol (ECG at admission w/sinus tachycardia, telemetry showing NSR and 1st deg AVB).    Hypothyroidism - Continue synthroid  -TSH mildly elevated but free T4 normal   HLD - Continue statin   DNR: POA   BPH: Not retaining on PVR. - Med rec pending.    Old records reviewed in assessment of this patient  Antimicrobials: Ancef 8/11 >> 8/12 Rocephin 8/11 >> current   DVT prophylaxis:  SCDs Start: 12/15/22 1939   Code Status:   Code Status: DNR  Mobility Assessment (Last 72 Hours)     Mobility Assessment     Row Name 12/18/22 8119 12/17/22 1958 12/17/22 0941 12/17/22 0800 12/17/22 0000   Does patient have an order for bedrest or is patient medically unstable No - Continue assessment No - Continue assessment -- No - Continue assessment No - Continue assessment   What is the highest level of mobility based on the progressive mobility assessment? Level 4 (Walks with assist in room) - Balance while marching in place and cannot step forward and back - Complete Level 4 (Walks with assist  in room) - Balance while marching in place and cannot step forward and back - Complete Level 4 (Walks with assist in room) - Balance while marching in place and cannot step forward and back - Complete Level 4 (Walks with assist in room) - Balance while marching in place and cannot step forward and  back - Complete Level 4 (Walks with assist in room) - Balance while marching in place and cannot step forward and back - Complete    Row Name 12/16/22 0900 12/15/22 1939         Does patient have an order for bedrest or is patient medically unstable No - Continue assessment No - Continue assessment      What is the highest level of mobility based on the progressive mobility assessment? Level 4 (Walks with assist in room) - Balance while marching in place and cannot step forward and back - Complete Level 4 (Walks with assist in room) - Balance while marching in place and cannot step forward and back - Complete               Barriers to discharge: none Disposition Plan:  Home Status is: Inpt  Objective: Blood pressure (!) 159/75, pulse 66, temperature 97.9 F (36.6 C), temperature source Oral, resp. rate 17, height 5\' 11"  (1.803 m), weight 57.4 kg, SpO2 98%.  Examination:  Physical Exam Constitutional:      General: He is not in acute distress.    Appearance: Normal appearance.  HENT:     Head: Normocephalic and atraumatic.     Mouth/Throat:     Mouth: Mucous membranes are moist.  Eyes:     Extraocular Movements: Extraocular movements intact.  Cardiovascular:     Rate and Rhythm: Normal rate and regular rhythm.  Pulmonary:     Effort: Pulmonary effort is normal. No respiratory distress.     Breath sounds: Normal breath sounds. No wheezing.  Abdominal:     General: Bowel sounds are normal. There is no distension.     Palpations: Abdomen is soft.     Tenderness: There is no abdominal tenderness.  Musculoskeletal:     Cervical back: Normal range of motion and neck supple.     Comments: Left foot/ankle noted with continued edema and calor.  Mild erythema.  Decreased ROM due to pain and swelling  Skin:    General: Skin is warm and dry.  Neurological:     General: No focal deficit present.     Mental Status: He is alert.  Psychiatric:        Mood and Affect: Mood normal.         Behavior: Behavior normal.      Consultants:    Procedures:    Data Reviewed: Results for orders placed or performed during the hospital encounter of 12/15/22 (from the past 24 hour(s))  TSH     Status: Abnormal   Collection Time: 12/18/22  4:12 AM  Result Value Ref Range   TSH 6.862 (H) 0.350 - 4.500 uIU/mL  CBC     Status: Abnormal   Collection Time: 12/18/22  4:12 AM  Result Value Ref Range   WBC 4.9 4.0 - 10.5 K/uL   RBC 2.38 (L) 4.22 - 5.81 MIL/uL   Hemoglobin 8.7 (L) 13.0 - 17.0 g/dL   HCT 40.9 (L) 81.1 - 91.4 %   MCV 112.2 (H) 80.0 - 100.0 fL   MCH 36.6 (H) 26.0 - 34.0 pg   MCHC 32.6 30.0 - 36.0  g/dL   RDW 62.1 (H) 30.8 - 65.7 %   Platelets 78 (L) 150 - 400 K/uL   nRBC 0.0 0.0 - 0.2 %  Basic metabolic panel     Status: Abnormal   Collection Time: 12/18/22  4:12 AM  Result Value Ref Range   Sodium 130 (L) 135 - 145 mmol/L   Potassium 3.3 (L) 3.5 - 5.1 mmol/L   Chloride 102 98 - 111 mmol/L   CO2 24 22 - 32 mmol/L   Glucose, Bld 101 (H) 70 - 99 mg/dL   BUN 18 8 - 23 mg/dL   Creatinine, Ser 8.46 0.61 - 1.24 mg/dL   Calcium 8.3 (L) 8.9 - 10.3 mg/dL   GFR, Estimated >96 >29 mL/min   Anion gap 4 (L) 5 - 15  T4, free     Status: None   Collection Time: 12/18/22  8:34 AM  Result Value Ref Range   Free T4 0.95 0.61 - 1.12 ng/dL   *Note: Due to a large number of results and/or encounters for the requested time period, some results have not been displayed. A complete set of results can be found in Results Review.    I have reviewed pertinent nursing notes, vitals, labs, and images as necessary. I have ordered labwork to follow up on as indicated.  I have reviewed the last notes from staff over past 24 hours. I have discussed patient's care plan and test results with nursing staff, CM/SW, and other staff as appropriate.  Time spent: Greater than 50% of the 55 minute visit was spent in counseling/coordination of care for the patient as laid out in the A&P.   LOS:  2 days   Lewie Chamber, MD [facility] 12/18/2022, 2:24 PM

## 2022-12-18 NOTE — Progress Notes (Signed)
Mobility Specialist Progress Note:    12/18/22 1415  Mobility  Activity Transferred from bed to chair  Level of Assistance Moderate assist, patient does 50-74%  Assistive Device Front wheel walker  Distance Ambulated (ft) 4 ft  Range of Motion/Exercises Active;All extremities  Activity Response Tolerated well  Mobility Referral Yes  $Mobility charge 1 Mobility  Mobility Specialist Start Time (ACUTE ONLY) 1415  Mobility Specialist Stop Time (ACUTE ONLY) 1425  Mobility Specialist Time Calculation (min) (ACUTE ONLY) 10 min   Pt was received in bed, agreeable to mobility session. Required ModA to stand and ambulate with RW. Tolerated well, pt limited by anxiety of falling and weakness. Left pt in chair, call bell in reach. Chair alarm on, all needs met.   Lawerance Bach Mobility Specialist Please contact via Special educational needs teacher or  Rehab office at [phone]

## 2022-12-18 NOTE — Care Management Important Message (Signed)
Important Message  Patient Details  Name: Dale [name] MRN: [id number] Date of Birth: Sep 24, 1928   Medicare Important Message Given:  N/A - LOS <3 / Initial given by admissions     Corey Harold 12/18/2022, 11:20 AM

## 2022-12-19 ENCOUNTER — Inpatient Hospital Stay: Payer: Medicare Other

## 2022-12-19 DIAGNOSIS — G9341 Metabolic encephalopathy: Secondary | ICD-10-CM | POA: Diagnosis not present

## 2022-12-19 DIAGNOSIS — L03116 Cellulitis of left lower limb: Secondary | ICD-10-CM | POA: Diagnosis not present

## 2022-12-19 DIAGNOSIS — R7881 Bacteremia: Secondary | ICD-10-CM | POA: Diagnosis not present

## 2022-12-19 LAB — T3: T3, Total: 64 ng/dL — ABNORMAL LOW (ref 71–180)

## 2022-12-19 MED ORDER — SODIUM CHLORIDE 0.9 % IV SOLN
1.0000 g | Freq: Two times a day (BID) | INTRAVENOUS | Status: DC
  Administered 2022-12-19 – 2022-12-20 (×4): 1 g via INTRAVENOUS
  Filled 2022-12-19 (×3): qty 20

## 2022-12-19 NOTE — Progress Notes (Signed)
Up in chair this shift, tolerated well. No complaints this shift. No events this shift. Patient to be NPO after midnight tonight for port removal tomorrow.

## 2022-12-19 NOTE — Progress Notes (Signed)
Progress Note    IVES TARA   ZOX:[id number]  DOB: [birth year]  DOA: 12/15/2022     3 PCP: Elfredia Nevins, MD  Initial CC: left foot/ankle pain  Hospital Course: Dale [name] is a 89 y.o. male with a history of multiple myeloma undergoing chemotherapy, ITP, CAD, HTN, HLD, hyponatremia, hypothyroidism, BPH who presented to the ED on 12/15/2022 with confusion found to have suspected cellulitis of the lower left leg/ankle.  Blood cultures have grown GNRs in aerobic bottles. MRI showed no osteomyelitis or abscess at the ankle, and cellulitis appears to be improving.   Interval History:  No events overnight. Wife present this morning on return visit to room. I've discussed case with ID and also informed Dr. Ellin Saba of plan for port removal and consulted surgery for assistance with this.   Assessment and Plan:  Left ankle cellulitis - noted on admission to be erythematous, warm, swollen - MRI foot negative for septic arthritis or osteomyelitis - still tender but the erythema appears improved; lingering edema - continue working with PT as able - abx modified for bacteremia, see below  GNR bacteremia due to Achromobacter dentrificans - atypical for the cellulitis to be etiology of bacteremia; GI source seems atypical as well after ID consult review - 2/4 bottles (1 from each set, aerobic bottle) - given atypical organism, have asked for ID input as well; appreciate assistance - recommendation is for port removal given high risk for seeding and/or biofilm  -No abdominal pain complaints but CT abdomen/pelvis on admission was notable for wall thickening of the proximal to mid transverse colon with extensive sigmoid diverticulosis but no diverticulitis-Leukocytosis has normalized also - abx changed to meropenem; once port removed and if remains clinically stable then will finish 10 day course with Bactrim    Acute metabolic encephalopathy With acute and more subacute components,  possible neurodegenerative disease/dementia which puts him at higher risk of delirium with infections, etc. CT head with cerebral volume loss and small vessel ischemic changes without acute findings. - Delirium precautions - Treat infectious etiology as above and monitor. - ?paraneoplastic w/pulmonary opacity - Continue with plans for outpatient neurology evaluation   Left lower lobe pulmonary opacity (4.6 x 1.3 x 2.6cm) - This appears grossly smaller than opacity November 17, 2022 which was treated with antibiotics. Would recommend repeat CT chest in 3 months (vs. PET vs. tissue sampling [which would be high risk w/thrombocytopenia])   Hyponatremia: Chronic. ?SIADH w/pulmonary opacity - Fluid restriction.    Pancytopenia, macrocytic anemia, thrombocytopenia: Has history of ITP - Monitor daily. 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    Multiple myeloma: Undergoing chemotherapy, last infusion 8/8 - Follows up with Dr. Ellin Saba - Continue prophylactic acyclovir unless renal function worsens    CAD  - Continue long acting nitrate, BB, ASA, statin   HTN - Continue home imdur, metoprolol (ECG at admission w/sinus tachycardia, telemetry showing NSR and 1st deg AVB).    Hypothyroidism - Continue synthroid  -TSH mildly elevated but free T4 normal   HLD - Continue statin   DNR: POA   BPH: Not retaining on PVR. - Med rec pending.    Old records reviewed in assessment of this patient  Antimicrobials: Ancef 8/11 >> 8/12 Rocephin 8/11 >> 8/15 Meropenem 8/15 >> current    DVT prophylaxis:  SCDs Start: 12/15/22 1939   Code Status:   Code Status: DNR  Mobility Assessment (Last 72 Hours)     Mobility Assessment     Row Name 12/19/22 0800  12/19/22 0000 12/18/22 2000 12/18/22 0822 12/17/22 1958   Does patient have an order for bedrest or is patient medically unstable No - Continue assessment No - Continue assessment No - Continue assessment No - Continue assessment No - Continue assessment   What is  the highest level of mobility based on the progressive mobility assessment? Level 4 (Walks with assist in room) - Balance while marching in place and cannot step forward and back - Complete Level 4 (Walks with assist in room) - Balance while marching in place and cannot step forward and back - Complete Level 4 (Walks with assist in room) - Balance while marching in place and cannot step forward and back - Complete Level 4 (Walks with assist in room) - Balance while marching in place and cannot step forward and back - Complete Level 4 (Walks with assist in room) - Balance while marching in place and cannot step forward and back - Complete    Row Name 12/17/22 0941 12/17/22 0800 12/17/22 0000       Does patient have an order for bedrest or is patient medically unstable -- No - Continue assessment No - Continue assessment     What is the highest level of mobility based on the progressive mobility assessment? Level 4 (Walks with assist in room) - Balance while marching in place and cannot step forward and back - Complete Level 4 (Walks with assist in room) - Balance while marching in place and cannot step forward and back - Complete Level 4 (Walks with assist in room) - Balance while marching in place and cannot step forward and back - Complete             Mobility Assessment (Last 72 Hours)     Mobility Assessment     Row Name 12/19/22 0800 12/19/22 0000 12/18/22 2000 12/18/22 0822 12/17/22 1958   Does patient have an order for bedrest or is patient medically unstable No - Continue assessment No - Continue assessment No - Continue assessment No - Continue assessment No - Continue assessment   What is the highest level of mobility based on the progressive mobility assessment? Level 4 (Walks with assist in room) - Balance while marching in place and cannot step forward and back - Complete Level 4 (Walks with assist in room) - Balance while marching in place and cannot step forward and back - Complete  Level 4 (Walks with assist in room) - Balance while marching in place and cannot step forward and back - Complete Level 4 (Walks with assist in room) - Balance while marching in place and cannot step forward and back - Complete Level 4 (Walks with assist in room) - Balance while marching in place and cannot step forward and back - Complete    Row Name 12/17/22 0941 12/17/22 0800 12/17/22 0000       Does patient have an order for bedrest or is patient medically unstable -- No - Continue assessment No - Continue assessment     What is the highest level of mobility based on the progressive mobility assessment? Level 4 (Walks with assist in room) - Balance while marching in place and cannot step forward and back - Complete Level 4 (Walks with assist in room) - Balance while marching in place and cannot step forward and back - Complete Level 4 (Walks with assist in room) - Balance while marching in place and cannot step forward and back - Complete  Mobility Assessment (Last 72 Hours)     Mobility Assessment     Row Name 12/19/22 0800 12/19/22 0000 12/18/22 2000 12/18/22 0822 12/17/22 1958   Does patient have an order for bedrest or is patient medically unstable No - Continue assessment No - Continue assessment No - Continue assessment No - Continue assessment No - Continue assessment   What is the highest level of mobility based on the progressive mobility assessment? Level 4 (Walks with assist in room) - Balance while marching in place and cannot step forward and back - Complete Level 4 (Walks with assist in room) - Balance while marching in place and cannot step forward and back - Complete Level 4 (Walks with assist in room) - Balance while marching in place and cannot step forward and back - Complete Level 4 (Walks with assist in room) - Balance while marching in place and cannot step forward and back - Complete Level 4 (Walks with assist in room) - Balance while marching in place and  cannot step forward and back - Complete    Row Name 12/17/22 0941 12/17/22 0800 12/17/22 0000       Does patient have an order for bedrest or is patient medically unstable -- No - Continue assessment No - Continue assessment     What is the highest level of mobility based on the progressive mobility assessment? Level 4 (Walks with assist in room) - Balance while marching in place and cannot step forward and back - Complete Level 4 (Walks with assist in room) - Balance while marching in place and cannot step forward and back - Complete Level 4 (Walks with assist in room) - Balance while marching in place and cannot step forward and back - Complete              Barriers to discharge: none Disposition Plan:  Home Status is: Inpt  Objective: Blood pressure (!) 142/69, pulse 66, temperature (!) 97.4 F (36.3 C), temperature source Oral, resp. rate 16, height 5\' 11"  (1.803 m), weight 55.6 kg, SpO2 100%.  Examination:  Physical Exam Constitutional:      General: He is not in acute distress.    Appearance: Normal appearance.  HENT:     Head: Normocephalic and atraumatic.     Mouth/Throat:     Mouth: Mucous membranes are moist.  Eyes:     Extraocular Movements: Extraocular movements intact.  Cardiovascular:     Rate and Rhythm: Normal rate and regular rhythm.  Pulmonary:     Effort: Pulmonary effort is normal. No respiratory distress.     Breath sounds: Normal breath sounds. No wheezing.  Abdominal:     General: Bowel sounds are normal. There is no distension.     Palpations: Abdomen is soft.     Tenderness: There is no abdominal tenderness.  Musculoskeletal:     Cervical back: Normal range of motion and neck supple.     Comments: Left foot/ankle noted with continued edema and calor.  Mild erythema.  Decreased ROM due to pain and swelling  Skin:    General: Skin is warm and dry.  Neurological:     General: No focal deficit present.     Mental Status: He is alert.  Psychiatric:         Mood and Affect: Mood normal.        Behavior: Behavior normal.      Consultants:  ID General surgery   Procedures:    Data Reviewed: No results  found. However, due to the size of the patient record, not all encounters were searched. Please check Results Review for a complete set of results.   I have reviewed pertinent nursing notes, vitals, labs, and images as necessary. I have ordered labwork to follow up on as indicated.  I have reviewed the last notes from staff over past 24 hours. I have discussed patient's care plan and test results with nursing staff, CM/SW, and other staff as appropriate.  Time spent: Greater than 50% of the 55 minute visit was spent in counseling/coordination of care for the patient as laid out in the A&P.   LOS: 3 days   Lewie Chamber, MD [facility] 12/19/2022, 3:37 PM

## 2022-12-19 NOTE — Care Management Important Message (Signed)
Important Message  Patient Details  Name: Dale [name] MRN: [id number] Date of Birth: 09/08/1928   Medicare Important Message Given:  Yes     Corey Harold 12/19/2022, 1:07 PM

## 2022-12-19 NOTE — Consult Note (Signed)
[facility]    Date of Admission:  12/15/2022     Reason for Consult: bacteremia/port    Referring Provider: Myra Rude     Lines:  Chronic port  Abx: 8/15-c meropenem      Outpatient acyclovir continued  8/11-15 ceftriaxone  Assessment: 89 yo male with mm on iv velcade and darzalex (last dose 12/12/22), CAD, hypothyroidism, hx sarcoidosis, admitted 8/11 in setting left lower leg pain/rash and swelling of a day, found to have nonfermenter gram negative rod bacteremia  I reviewed picture of his left leg. Mild diffuse faint erythema; no obvious skin breakdown. Other ddx bruising; platelet in 70s-80s. Mri showed no OM or abscess  He had a low grade fever on hd #1 and mild leukocytosis 11 on admission  Per progress note from hospital medicine, the rash appears to be improving   8/11 bcx achromobacter denitrificans; S imipenem and bactrim; R cipro, gent   8/11 bcx achromobacter denitrifican that is R cipro; s imipenem, bactrim   Clinically he has no decompensation or sign of gram negative sepsis   He has a port, which in this case of this type of bacteria, I suspect is the source and will need to be removed.   Lung mass ?plasmacytoma vs primary lung cancer -- needs f/u Colitis changes on abd ct only imaging finding without sx -- not a usual source for achromobacter  Plan: Remove port Repeat blood once port removed, or if recurrent fever before that Start meropenem Once port removed and repeat bcx negative plan 10 day treatment; oral bactrim can be used once port removed and patient continues to improvej Ct chest finding of mass management per primary team Discussed with primary team    I spent more than 15 minute reviewing data/chartdiscussing diagnostics/treatment plan with treatment team       ------------------------------------------------ Principal Problem:   Cellulitis of left ankle Active Problems:    Acquired hypothyroidism   HYPERCHOLESTEROLEMIA   Essential hypertension   CAD, RCA stent, RCA new DES July 2009, Cath [place] 2011, 02/02/12   Thrombocytopenia (HCC)   BPH (benign prostatic hyperplasia)   Anemia of chronic disease   Multiple myeloma (HCC)   Chronic hyponatremia - baseline 125-134   Acute metabolic encephalopathy   DNR (do not resuscitate)/DNI(Do Not Intubate)   Gram-negative bacteremia    HPI: Dale [name] is a 89 y.o. male mm on chemo via port here for left leg cellulitis but bcx also positive  Chart reviewed  On ceftriaxone  Bcx achromobacter 2 of 2 sets  Improving cellulitis  Mild leukocytosis and fever within 24 hours arrival all resolved  No other source it appears  Ct abd pelv mild colitis changes but no sx; increasing size lung mass   Family History  Problem Relation Age of Onset   Heart disease Father        deceased age 81   Stroke Mother    Alzheimer's disease Mother    Heart attack Brother        deceased age 42   Cancer Other        niece   Colon cancer Neg Hx     Social History   Tobacco Use   Smoking status: Former    Current packs/day: 0.00    Average packs/day: 0.1 packs/day for 2.0 years (0.2 ttl pk-yrs)    Types: Cigarettes, Cigars    Start date: 05/06/1968    Quit  date: 05/06/1970    Years since quitting: 52.6   Smokeless tobacco: Never  Vaping Use   Vaping status: Never Used  Substance Use Topics   Alcohol use: No    Alcohol/week: 0.0 standard drinks of alcohol   Drug use: No    Allergies  Allergen Reactions   Azithromycin Other (See Comments)    Sore mouth and fever blisters around mouth, sores in nose area as well   Doxazosin Shortness Of Breath   Atenolol Other (See Comments)    UNKNOWN REACTION   Hydrocodone Nausea And Vomiting   Levofloxacin Other (See Comments)    Caused stomach problems.   Morphine Other (See Comments)    "made me crazy"   Penicillins Nausea And Vomiting and Other (See Comments)    Has  patient had a PCN reaction causing immediate rash, facial/tongue/throat swelling, SOB or lightheadedness with hypotension: No Has patient had a PCN reaction causing severe rash involving mucus membranes or skin necrosis: No Has patient had a PCN reaction that required hospitalization No Has patient had a PCN reaction occurring within the last 10 years: No If all of the above answers are "NO", then may proceed with Cephalosporin use.    Sulfonamide Derivatives Nausea And Vomiting    Review of Systems: ROS All Other ROS was negative, except mentioned above   Past Medical History:  Diagnosis Date   Allergic rhinitis    Anal fissure    Anginal pain (HCC)    Asthma    Cancer (HCC)    skin   Chest pain    CHF (congestive heart failure) (HCC)    Coronary heart disease    s/p stenting. cath in 01/2012 noncritical occlusion   Dyspnea    Dysrhythmia    1st degree heart block   GERD (gastroesophageal reflux disease)    Glaucoma    Hiatal hernia    Hyperlipidemia    Hypertension    Hypothyroidism    Idiopathic thrombocytopenic purpura (HCC) 2002   Macular degeneration    Multiple myeloma (HCC) 01/15/2021   Nephrolithiasis    PUD (peptic ulcer disease)    remote   Sarcoidosis    pulmonary   Schatzki's ring        Scheduled Meds:  acyclovir  400 mg Oral BID   aspirin EC  81 mg Oral Q breakfast   atorvastatin  40 mg Oral Daily   isosorbide mononitrate  60 mg Oral BID   levothyroxine  75 mcg Oral QAC breakfast   metoprolol tartrate  25 mg Oral BID   ondansetron (ZOFRAN) IV  4 mg Intravenous Once   pantoprazole  40 mg Oral Daily   Continuous Infusions:  meropenem (MERREM) IV 1 g (12/19/22 1057)   PRN Meds:.acetaminophen **OR** acetaminophen, ondansetron **OR** ondansetron (ZOFRAN) IV   OBJECTIVE: Blood pressure (!) 142/69, pulse 66, temperature (!) 97.4 F (36.3 C), temperature source Oral, resp. rate 16, height 5\' 11"  (1.803 m), weight 55.6 kg, SpO2 100%.  Physical  Exam Chart reviewed   Lab Results Lab Results  Component Value Date   WBC 4.9 12/18/2022   HGB 8.7 (L) 12/18/2022   HCT 26.7 (L) 12/18/2022   MCV 112.2 (H) 12/18/2022   PLT 78 (L) 12/18/2022    Lab Results  Component Value Date   CREATININE 0.90 12/18/2022   BUN 18 12/18/2022   NA 130 (L) 12/18/2022   K 3.3 (L) 12/18/2022   CL 102 12/18/2022   CO2 24 12/18/2022  Lab Results  Component Value Date   ALT 26 12/16/2022   AST 20 12/16/2022   ALKPHOS 53 12/16/2022   BILITOT 0.9 12/16/2022      Microbiology: Recent Results (from the past 240 hour(s))  Culture, blood (routine x 2)     Status: None (Preliminary result)   Collection Time: 12/15/22 12:27 PM   Specimen: BLOOD RIGHT ARM  Result Value Ref Range Status   Specimen Description   Final    BLOOD RIGHT ARM BOTTLES DRAWN AEROBIC AND ANAEROBIC Performed at [facility], [address], [place], [place] [address]    Special Requests   Final    Blood Culture adequate volume Performed at [facility], [address] [place], [place] [address]    Culture  Setup Time   Final    GRAM NEGATIVE RODS AEROBIC BOTTLE ONLY Gram Stain Report Called to,Read Back By and Verified With: C BLACKWELL AT 1331 ON [id number] BY S DALTON CRITICAL RESULT CALLED TO, READ BACK BY AND VERIFIED WITH: RN DEANNA SMITH ON 12/16/22 @ 1705 BY DRT    Culture   Final    GRAM NEGATIVE RODS IDENTIFICATION TO FOLLOW Performed at [facility], [address], [place], [place] [address]    Report Status PENDING  Incomplete  Culture, blood (routine x 2)     Status: Abnormal   Collection Time: 12/15/22 12:27 PM   Specimen: Left Antecubital; Blood  Result Value Ref Range Status   Specimen Description   Final    LEFT ANTECUBITAL BOTTLES DRAWN AEROBIC AND ANAEROBIC Performed at [facility], [address], [place], [place] [address]    Special Requests   Final    Blood Culture adequate volume Performed at [facility], [address]  [place], [place] [address]    Culture  Setup Time   Final    GRAM NEGATIVE RODS AEROBIC BOTTLE ONLY Gram Stain Report Called to,Read Back By and Verified With: B SCHWARTZ AT 1309 ON [id number] BY S DALTON Performed at [facility], [address] [place], [place] [address]    Culture ACHROMOBACTER DENITRIFICANS (A)  Final   Report Status 12/19/2022 FINAL  Final   Organism ID, Bacteria ACHROMOBACTER DENITRIFICANS  Final      Susceptibility   Achromobacter denitrificans - MIC*    GENTAMICIN 8 INTERMEDIATE Intermediate     CIPROFLOXACIN 2 INTERMEDIATE Intermediate     IMIPENEM 1 SENSITIVE Sensitive     TRIMETH/SULFA <=20 SENSITIVE Sensitive     * ACHROMOBACTER DENITRIFICANS  Blood Culture ID Panel (Reflexed)     Status: None   Collection Time: 12/15/22 12:27 PM  Result Value Ref Range Status   Enterococcus faecalis NOT DETECTED NOT DETECTED Final   Enterococcus Faecium NOT DETECTED NOT DETECTED Final   Listeria monocytogenes NOT DETECTED NOT DETECTED Final   Staphylococcus species NOT DETECTED NOT DETECTED Final   Staphylococcus aureus (BCID) NOT DETECTED NOT DETECTED Final   Staphylococcus epidermidis NOT DETECTED NOT DETECTED Final   Staphylococcus lugdunensis NOT DETECTED NOT DETECTED Final   Streptococcus species NOT DETECTED NOT DETECTED Final   Streptococcus agalactiae NOT DETECTED NOT DETECTED Final   Streptococcus pneumoniae NOT DETECTED NOT DETECTED Final   Streptococcus pyogenes NOT DETECTED NOT DETECTED Final   A.calcoaceticus-baumannii NOT DETECTED NOT DETECTED Final   Bacteroides fragilis NOT DETECTED NOT DETECTED Final   Enterobacterales NOT DETECTED NOT DETECTED Final   Enterobacter cloacae complex NOT DETECTED NOT DETECTED Final   Escherichia coli NOT DETECTED  NOT DETECTED Final   Klebsiella aerogenes NOT DETECTED NOT DETECTED Final   Klebsiella oxytoca NOT DETECTED NOT DETECTED Final   Klebsiella pneumoniae NOT DETECTED NOT DETECTED Final   Proteus species NOT  DETECTED NOT DETECTED Final   Salmonella species NOT DETECTED NOT DETECTED Final   Serratia marcescens NOT DETECTED NOT DETECTED Final   Haemophilus influenzae NOT DETECTED NOT DETECTED Final   Neisseria meningitidis NOT DETECTED NOT DETECTED Final   Pseudomonas aeruginosa NOT DETECTED NOT DETECTED Final   Stenotrophomonas maltophilia NOT DETECTED NOT DETECTED Final   Candida albicans NOT DETECTED NOT DETECTED Final   Candida auris NOT DETECTED NOT DETECTED Final   Candida glabrata NOT DETECTED NOT DETECTED Final   Candida krusei NOT DETECTED NOT DETECTED Final   Candida parapsilosis NOT DETECTED NOT DETECTED Final   Candida tropicalis NOT DETECTED NOT DETECTED Final   Cryptococcus neoformans/gattii NOT DETECTED NOT DETECTED Final    Comment: Performed at [facility], [address], [place], [place] [address]     Serology:    Imaging: If present, new imagings (plain films, ct scans, and mri) have been personally visualized and interpreted; radiology reports have been reviewed. Decision making incorporated into the Impression / Recommendations.  8/11 ct abd pelv 1. Mild wall thickening of the proximal to mid transverse colon, which may be secondary to underdistention or mild colitis. 2. Extensive sigmoid diverticulosis without evidence of acute diverticulitis. 3. Mass-like opacity at the periphery of the left lower lobe measures approximately 4.6 x 1.3 x 2.6 cm, previously measured approximately 3.9 x 2.0 x 2.6 cm. Consider one of the following in 3 months for both low-risk and high-risk individuals: (a) repeat chest CT, (b) follow-up PET-CT, or (c) tissue sampling  8/12 mri left ankle 1. Nonspecific generalized subcutaneous edema surrounding the ankle and extending into the dorsal aspect of the midfoot. No focal fluid collection or abnormal enhancement identified. 2. No evidence of septic arthritis or osteomyelitis. 3. Longitudinal split tear of the peroneus brevis  tendon. The additional ankle tendons and ligaments appear intact.  Raymondo Band, MD [facility] [phone] pager    12/19/2022, 1:40 PM

## 2022-12-19 NOTE — Consult Note (Signed)
Reason for Consult:Removal of chemotherapy port  Referring Physician: Lewie Chamber, MD   Dale [name] is an 89 y.o. male.  HPI:  History obtained from patient who is noted to have acute metabolic encephalopathy.  Patient reports that on Monday of last week he developed left lower leg/ankle pain out of nowhere the morning. The day prior he had been moving some tree branches outside. He says it is painful to walk on. He does not remember being stung by any insect. He also notes some abdominal pain during this time.   This afternoon he endorses some pain when the leg is touched as well as maybe some belly pain too.  He denies any chills, fevers, nausea, vomiting, and diarrhea. He states that his chemotherapy port has previously been replaced in [place] over a year ago due to leaking. His current port has been in place for over a year.  Past Medical History:  Diagnosis Date   Allergic rhinitis    Anal fissure    Anginal pain (HCC)    Asthma    Cancer (HCC)    skin   Chest pain    CHF (congestive heart failure) (HCC)    Coronary heart disease    s/p stenting. cath in 01/2012 noncritical occlusion   Dyspnea    Dysrhythmia    1st degree heart block   GERD (gastroesophageal reflux disease)    Glaucoma    Hiatal hernia    Hyperlipidemia    Hypertension    Hypothyroidism    Idiopathic thrombocytopenic purpura (HCC) 2002   Macular degeneration    Multiple myeloma (HCC) 01/15/2021   Nephrolithiasis    PUD (peptic ulcer disease)    remote   Sarcoidosis    pulmonary   Schatzki's ring     Past Surgical History:  Procedure Laterality Date   cardiac stents     COLONOSCOPY  10/30/2006   Normal rectum, sigmoid diverticula.Remainder of colonic mucosa appeared normal.   CORONARY ANGIOPLASTY WITH STENT PLACEMENT     about 10 years ago per pt (around 2007)   CYSTOSCOPY WITH RETROGRADE PYELOGRAM, URETEROSCOPY AND STENT PLACEMENT Left 06/16/2017   Procedure: CYSTOSCOPY WITH RETROGRADE  PYELOGRAM, URETEROSCOPY,STONE EXTRACTION  AND STENT PLACEMENT;  Surgeon: Marcine Matar, MD;  Location: [facility];  Service: Urology;  Laterality: Left;   ESOPHAGOGASTRODUODENOSCOPY  06/19/2004   Two esophageal rings and esophageal web as described above.  All of these were disrupted by passing 56-French Elease Hashimoto dilator/ Candida esophagitis,which appears to be incidental given history of   antibiotic use, but nevertheless will be treated.   ESOPHAGOGASTRODUODENOSCOPY  10/30/2006   Distal tandem esophageal ring status post dilation disruption as  described above.  Otherwise normal esophagus/  Small hiatal hernia otherwise normal stomach, D1 and D2   ESOPHAGOGASTRODUODENOSCOPY N/A 03/22/2015   Dr.Rourk- noncritical schatzki's ring and hiatal hernia-o/w normal EGD.    ESOPHAGOGASTRODUODENOSCOPY (EGD) WITH ESOPHAGEAL DILATION  03/04/2012   RMR- schatzki's ring, hiatal hernia, polypoid gastric mucosa, bx= minimally active gastritis.   HOLMIUM LASER APPLICATION Left 06/16/2017   Procedure: HOLMIUM LASER APPLICATION;  Surgeon: Marcine Matar, MD;  Location: [facility];  Service: Urology;  Laterality: Left;   IR CV LINE INJECTION  12/27/2021   IR GENERIC HISTORICAL  03/06/2016   IR RADIOLOGIST EVAL & MGMT 03/06/2016 Irish Lack, MD [facility]   IR GENERIC HISTORICAL  06/18/2016   IR RADIOLOGIST EVAL & MGMT 06/18/2016 Irish Lack, MD [facility]   IR IMAGING GUIDED PORT INSERTION  12/18/2021   IR IMAGING GUIDED PORT INSERTION  01/15/2022   IR RADIOLOGIST EVAL & MGMT  10/01/2016   IR RADIOLOGIST EVAL & MGMT  10/15/2017   IR RADIOLOGIST EVAL & MGMT  12/24/2018   IR RADIOLOGIST EVAL & MGMT  01/04/2020   IR RADIOLOGIST EVAL & MGMT  06/20/2021   IR REMOVAL TUN ACCESS W/ PORT W/O FL MOD SED  01/15/2022   LEFT HEART CATH N/A 02/02/2012   Procedure: LEFT HEART CATH;  Surgeon: Runell Gess, MD;  Location: [facility] C[facility];  Service: Cardiovascular;  Laterality: N/A;   MEDIASTINOSCOPY     for dx sarcoid    RADIOLOGY WITH ANESTHESIA Left 05/17/2016   Procedure: left renal ablation;  Surgeon: Irish Lack, MD;  Location: [facility];  Service: Radiology;  Laterality: Left;    Family History  Problem Relation Age of Onset   Heart disease Father        deceased age 89   Stroke Mother    Alzheimer's disease Mother    Heart attack Brother        deceased age 32   Cancer Other        niece   Colon cancer Neg Hx     Social History:  reports that he quit smoking about 52 years ago. His smoking use included cigarettes and cigars. He started smoking about 54 years ago. He has a 0.2 pack-year smoking history. He has never used smokeless tobacco. He reports that he does not drink alcohol and does not use drugs.  Allergies:  Allergies  Allergen Reactions   Azithromycin Other (See Comments)    Sore mouth and fever blisters around mouth, sores in nose area as well   Doxazosin Shortness Of Breath   Atenolol Other (See Comments)    UNKNOWN REACTION   Hydrocodone Nausea And Vomiting   Levofloxacin Other (See Comments)    Caused stomach problems.   Morphine Other (See Comments)    "made me crazy"   Penicillins Nausea And Vomiting and Other (See Comments)    Has patient had a PCN reaction causing immediate rash, facial/tongue/throat swelling, SOB or lightheadedness with hypotension: No Has patient had a PCN reaction causing severe rash involving mucus membranes or skin necrosis: No Has patient had a PCN reaction that required hospitalization No Has patient had a PCN reaction occurring within the last 10 years: No If all of the above answers are "NO", then may proceed with Cephalosporin use.    Sulfonamide Derivatives Nausea And Vomiting    Medications: Prior to Admission:  Medications Prior to Admission  Medication Sig Dispense Refill Last Dose   acetaminophen (TYLENOL) 325 MG tablet Take 2 tablets (650 mg total) by mouth every 6 (six) hours as needed for mild pain (or Fever >/= 101).       acyclovir (ZOVIRAX) 400 MG tablet TAKE 1 TABLET BY MOUTH TWICE  DAILY 180 tablet 0    Ascorbic Acid (VITAMIN C PO) Take 500 mg by mouth every evening.      aspirin EC 81 MG tablet Take 1 tablet (81 mg total) by mouth daily with breakfast. For 30 days Only (Patient taking differently: Take 81 mg by mouth daily with breakfast. Every Monday, Wednesday, and Friday) 30 tablet 0    atorvastatin (LIPITOR) 40 MG tablet TAKE 1 TABLET BY MOUTH IN THE  EVENING 90 tablet 1    Bortezomib (VELCADE IJ) Inject as directed once a week.      brimonidine (ALPHAGAN) 0.2 %  ophthalmic solution Place 1 drop into the left eye 2 (two) times daily.      cyanocobalamin 1000 MCG tablet Take 1 tablet (1,000 mcg total) by mouth daily. 30 tablet 3    dexamethasone (DECADRON) 2 MG tablet TAKE 5 TABLETS BY MOUTH ONCE  WEEKLY 60 tablet 0    dorzolamide-timolol (COSOPT) 22.3-6.8 MG/ML ophthalmic solution Place 1 drop into the left eye 2 (two) times daily.      feeding supplement (ENSURE ENLIVE / ENSURE PLUS) LIQD Take 237 mLs by mouth 3 (three) times daily between meals.      furosemide (LASIX) 20 MG tablet TAKE 2 TABLETS BY MOUTH DAILY, IF YOU NOTICE ANY SWELLING. 90 tablet 1    isosorbide mononitrate (IMDUR) 60 MG 24 hr tablet TAKE 1 AND 1/2 TABLETS BY MOUTH  IN THE MORNING AND ONE-HALF  TABLET BY MOUTH AT NIGHT 180 tablet 2    levothyroxine (SYNTHROID) 75 MCG tablet Take 75 mcg by mouth daily before breakfast.      lidocaine-prilocaine (EMLA) cream Apply 1 Application topically as needed. 30 g 1    losartan (COZAAR) 100 MG tablet TAKE 1 TABLET BY MOUTH DAILY (Patient taking differently: Take 100 mg by mouth daily.) 90 tablet 3    metoprolol tartrate (LOPRESSOR) 25 MG tablet TAKE 1 TABLET BY MOUTH IN THE  MORNING AND ONE-HALF TABLET BY  MOUTH IN THE EVENING 135 tablet 3    Multiple Vitamins-Minerals (PRESERVISION/LUTEIN) CAPS Take 1 capsule by mouth 2 (two) times daily.      nitroGLYCERIN (NITROSTAT) 0.4 MG SL tablet Place 1 tablet  (0.4 mg total) under the tongue every 5 (five) minutes as needed. For chest pain 25 tablet 2    pantoprazole (PROTONIX) 40 MG tablet TAKE 1 TABLET BY MOUTH DAILY  BEFORE BREAKFAST (Patient taking differently: Take 40 mg by mouth daily.) 90 tablet 3    potassium chloride SA (KLOR-CON M) 20 MEQ tablet Take 20 mEq by mouth daily.      prochlorperazine (COMPAZINE) 10 MG tablet Take 1 tablet (10 mg total) by mouth every 6 (six) hours as needed for nausea or vomiting. 30 tablet 3    ROCKLATAN 0.02-0.005 % SOLN Place 1 drop into the left eye at bedtime.      triamcinolone (KENALOG) 0.1 % cream Apply 1 application. topically 2 (two) times daily as needed (for irritation).      vitamin E 200 UNIT capsule Take 200 Units by mouth every evening.       Results for orders placed or performed during the hospital encounter of 12/15/22 (from the past 48 hour(s))  TSH     Status: Abnormal   Collection Time: 12/18/22  4:12 AM  Result Value Ref Range   TSH 6.862 (H) 0.350 - 4.500 uIU/mL    Comment: Performed by a 3rd Generation assay with a functional sensitivity of <=0.01 uIU/mL. Performed at [facility], [address] [place], [place] [address]   CBC     Status: Abnormal   Collection Time: 12/18/22  4:12 AM  Result Value Ref Range   WBC 4.9 4.0 - 10.5 K/uL   RBC 2.38 (L) 4.22 - 5.81 MIL/uL   Hemoglobin 8.7 (L) 13.0 - 17.0 g/dL   HCT 60.4 (L) 54.0 - 98.1 %   MCV 112.2 (H) 80.0 - 100.0 fL   MCH 36.6 (H) 26.0 - 34.0 pg   MCHC 32.6 30.0 - 36.0 g/dL   RDW 19.1 (H) 47.8 - 29.5 %   Platelets  78 (L) 150 - 400 K/uL    Comment: SPECIMEN CHECKED FOR CLOTS Immature Platelet Fraction may be clinically indicated, consider ordering this additional test [id number] CONSISTENT WITH PREVIOUS RESULT REPEATED TO VERIFY    nRBC 0.0 0.0 - 0.2 %    Comment: Performed at [facility], [address] [place], [place] [address]  Basic metabolic panel     Status: Abnormal   Collection Time: 12/18/22  4:12 AM  Result  Value Ref Range   Sodium 130 (L) 135 - 145 mmol/L   Potassium 3.3 (L) 3.5 - 5.1 mmol/L   Chloride 102 98 - 111 mmol/L   CO2 24 22 - 32 mmol/L   Glucose, Bld 101 (H) 70 - 99 mg/dL    Comment: Glucose reference range applies only to samples taken after fasting for at least 8 hours.   BUN 18 8 - 23 mg/dL   Creatinine, Ser 7.06 0.61 - 1.24 mg/dL   Calcium 8.3 (L) 8.9 - 10.3 mg/dL   GFR, Estimated >23 >76 mL/min    Comment: (NOTE) Calculated using the CKD-EPI Creatinine Equation (2021)    Anion gap 4 (L) 5 - 15    Comment: Performed at [facility], [address] [place], [place] [address]  T4, free     Status: None   Collection Time: 12/18/22  8:34 AM  Result Value Ref Range   Free T4 0.95 0.61 - 1.12 ng/dL    Comment: (NOTE) Biotin ingestion may interfere with free T4 tests. If the results are inconsistent with the TSH level, previous test results, or the clinical presentation, then consider biotin interference. If needed, order repeat testing after stopping biotin. Performed at [facility], [address], [place], [place] [address]    *Note: Due to a large number of results and/or encounters for the requested time period, some results have not been displayed. A complete set of results can be found in Results Review.    No results found.  ROS:  Pertinent items are noted in HPI.  Blood pressure (!) 142/69, pulse 66, temperature (!) 97.4 F (36.3 C), temperature source Oral, resp. rate 16, height 5\' 11"  (1.803 m), weight 55.6 kg, SpO2 100%. Physical Exam: General: pleasant and well-appearing CV: RRR, no murmurs Chest: infraclavicular left-sided chemotherapy port without surrounding erythema Pulm: no increased work of breathing Abdominal: soft, nondistended. No percussion tenderness, tenderness to palpation, rebound tenderness or guarding MSK: left foot up to the inferior portion of the gastroc is erythematous and swollen. Mild tenderness to palpation Psych: alert and  oriented x2. Dale [name] that it was 1990 but easily corrected   Assessment/Plan: Dale [name] is a 89 year old male with medical history significant for multiple myeloma (currently on chemotherapy), CAD, hypothyroidism, sarcoidosis, hypertension, hyperlipidemia, and ITP who was admitted on 12/05/22 for cellulitis of the lower left leg/ankle. MRI done on 8/13 did not show septic arthritis or osteomyelitis. He was also found to have GNR bacteremia with Achromobacter denitrificans.  He has been on IV antibiotics with a now normalized leukocytosis, and was recently switched to meropenem today following organism identification. ID has been consulted.   General surgery was consulted for removal of chemotherapy port secondary to GNR bacteremia.  Unsure what could be the cause of his bacteremia. Consider GI cause. Cellulitis is still significant but patient without fevers or leukocytosis. Port site does not appear infected on external inspection; however, given bacteremia, will plan to replace port to prevent seeding. Patient scheduled for port removal tomorrow.  Of note, CT AP from 8/11 showing mild wall thickening of the proximal to mid transverse colon, which may be secondary to underdistention or mild colitis.  - Port removal on 8/16 - NPO at midnight - continue IV antibiotics - Follow up blood culture following port removal - Appreciate ID recommendations - Rest of care per hospitalist  [facility] 12/19/2022, 2:24 PM

## 2022-12-20 ENCOUNTER — Encounter (HOSPITAL_COMMUNITY)
Admission: EM | Disposition: A | Discharge status: Skilled Nursing Facility | Payer: Self-pay | Source: Home / Self Care | Attending: Internal Medicine

## 2022-12-20 DIAGNOSIS — R7881 Bacteremia: Secondary | ICD-10-CM

## 2022-12-20 DIAGNOSIS — L03116 Cellulitis of left lower limb: Secondary | ICD-10-CM | POA: Diagnosis not present

## 2022-12-20 HISTORY — PX: PORT-A-CATH REMOVAL: SHX5289

## 2022-12-20 LAB — MAGNESIUM: Magnesium: 2 mg/dL (ref 1.7–2.4)

## 2022-12-20 LAB — BASIC METABOLIC PANEL
Anion gap: 4 — ABNORMAL LOW (ref 5–15)
BUN: 11 mg/dL (ref 8–23)
CO2: 24 mmol/L (ref 22–32)
Calcium: 8.2 mg/dL — ABNORMAL LOW (ref 8.9–10.3)
Chloride: 100 mmol/L (ref 98–111)
Creatinine, Ser: 0.7 mg/dL (ref 0.61–1.24)
GFR, Estimated: >60 mL/min (ref >60)
Glucose, Bld: 94 mg/dL (ref 70–99)
Potassium: 3.4 mmol/L — ABNORMAL LOW (ref 3.5–5.1)
Sodium: 128 mmol/L — ABNORMAL LOW (ref 135–145)

## 2022-12-20 SURGERY — MINOR REMOVAL PORT-A-CATH
Anesthesia: LOCAL | Laterality: Left

## 2022-12-20 MED ORDER — LIDOCAINE HCL (PF) 1 % IJ SOLN
INTRAMUSCULAR | Status: AC
  Filled 2022-12-20: qty 30

## 2022-12-20 MED ORDER — LIDOCAINE-EPINEPHRINE (PF) 1 %-1:200000 IJ SOLN
INTRAMUSCULAR | Status: DC | PRN
  Administered 2022-12-20: 4 mL

## 2022-12-20 MED ORDER — SULFAMETHOXAZOLE-TRIMETHOPRIM 800-160 MG PO TABS
1.0000 | ORAL_TABLET | Freq: Two times a day (BID) | ORAL | Status: DC
  Administered 2022-12-20 – 2022-12-23 (×6): 1 via ORAL
  Filled 2022-12-20 (×6): qty 1

## 2022-12-20 SURGICAL SUPPLY — 16 items
ADH SKN CLS APL DERMABOND .7 (GAUZE/BANDAGES/DRESSINGS) ×1
APL PRP STRL LF ISPRP CHG 10.5 (MISCELLANEOUS)
APPLICATOR CHLORAPREP 10.5 ORG (MISCELLANEOUS) IMPLANT
CLOTH BEACON ORANGE TIMEOUT ST (SAFETY) ×1 IMPLANT
DECANTER SPIKE VIAL GLASS SM (MISCELLANEOUS) ×1 IMPLANT
DERMABOND ADVANCED .7 DNX12 (GAUZE/BANDAGES/DRESSINGS) ×1 IMPLANT
DRAPE HALF SHEET 40X57 (DRAPES) IMPLANT
GLOVE BIOGEL PI IND STRL 6.5 (GLOVE) ×1 IMPLANT
GLOVE BIOGEL PI IND STRL 7.0 (GLOVE) ×2 IMPLANT
GLOVE SURG SS PI 6.5 STRL IVOR (GLOVE) ×1 IMPLANT
GOWN STRL REUS W/TWL LRG LVL3 (GOWN DISPOSABLE) IMPLANT
POSITIONER HEAD 8X9X4 ADT (SOFTGOODS) ×1 IMPLANT
SPONGE GAUZE 2X2 8PLY STRL LF (GAUZE/BANDAGES/DRESSINGS) ×1 IMPLANT
SUT MNCRL AB 4-0 PS2 18 (SUTURE) ×1 IMPLANT
SUT VIC AB 3-0 SH 27 (SUTURE) ×1
SUT VIC AB 3-0 SH 27X BRD (SUTURE) ×1 IMPLANT

## 2022-12-20 NOTE — Progress Notes (Signed)
Progress Note    Dale [name]   WUJ:[id number]  DOB: January 02, 1929  DOA: 12/15/2022     4 PCP: Elfredia Nevins, MD  Initial CC: left foot/ankle pain  Hospital Course: Dale [name] is a 89 y.o. male with a history of multiple myeloma undergoing chemotherapy, ITP, CAD, HTN, HLD, hyponatremia, hypothyroidism, BPH who presented to the ED on 12/15/2022 with confusion found to have suspected cellulitis of the lower left leg/ankle.  Blood cultures have grown GNRs in aerobic bottles. MRI showed no osteomyelitis or abscess at the ankle, and cellulitis appears to be improving.  Blood cultures speciated out to Achromobacter dentrificans.  Case was discussed with infectious disease and he was recommended for having his port removed which took place on 12/20/2022 with general surgery.  Oncology was also made aware of recommendations. Repeat blood cultures were obtained after port removal.  Interval History:  Successful removal of port today with general surgery. Repeat blood cultures have been obtained.  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  Both growing same organism as noted above - given atypical organism, have asked for ID input as well; appreciate assistance - recommendation is for port removal given high risk for seeding and/or biofilm.  Port removed on 12/20/2022 with general surgery - Repeat blood cultures obtained after port removal.   Watch for 48 hours -Meropenem transitioned to Bactrim per ID on 12/20/2022.  Plan is for total of 10-day course after port removal   Acute metabolic encephalopathy - resolved With acute and more subacute components, possible neurodegenerative disease/dementia which puts him at higher risk of delirium with infections, etc. CT head with cerebral volume loss and small vessel ischemic changes without acute findings. - Delirium precautions - Treat infectious etiology as above and monitor. -Overall has improved and remained stable - Continue with plans for outpatient neurology evaluation   Left lower lobe pulmonary opacity (4.6 x 1.3 x 2.6cm) - This appears grossly smaller than opacity November 17, 2022 which was treated with antibiotics. Would recommend repeat CT chest in 3 months (vs. PET vs. tissue sampling [which would be high risk w/thrombocytopenia])   Hyponatremia: Chronic. ?SIADH w/pulmonary opacity - Fluid restriction.    Pancytopenia, macrocytic anemia, thrombocytopenia: Has history of ITP - Monitor daily. 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    Multiple myeloma: Undergoing chemotherapy, last infusion 8/8 - Follows up with Dr. Ellin Saba - Continue prophylactic acyclovir unless renal function worsens    CAD  - Continue long acting nitrate, BB, ASA, statin   HTN - Continue home imdur, metoprolol (ECG at admission w/sinus tachycardia, telemetry showing NSR and 1st deg AVB).    Hypothyroidism - Continue synthroid  -TSH mildly elevated but free T4 normal   HLD - Continue statin   DNR: POA   BPH: Not retaining on PVR    Old records reviewed in assessment of this patient  Antimicrobials: Ancef 8/11 >> 8/12 Rocephin 8/11 >> 8/15 Meropenem 8/15 >> 8/16 Bactrim 8/16 >> current  DVT prophylaxis:  SCDs Start: 12/15/22 1939   Code Status:   Code Status: DNR  Mobility Assessment (Last 72 Hours)     Mobility Assessment     Row Name 12/20/22 0800 12/20/22 0000 12/19/22 2000 12/19/22 0800  12/19/22 0000   Does patient have an order for bedrest or is patient medically unstable No - Continue assessment No - Continue assessment No - Continue assessment No - Continue assessment No - Continue assessment   What is the highest level of mobility based on the progressive mobility assessment? Level 4 (Walks with assist in room) - Balance while marching in place and cannot step forward and back - Complete Level 4 (Walks with assist in room) - Balance while marching in place and cannot step forward and back - Complete Level 4 (Walks with assist in room) - Balance while marching in place and cannot step forward and back - Complete Level 4 (Walks with assist in room) - Balance while marching in place and cannot step forward and back - Complete Level 4 (Walks with assist in room) - Balance while marching in place and cannot step forward and back - Complete    Row Name 12/18/22 2000 12/18/22 0822 12/17/22 1958       Does patient have an order for bedrest or is patient medically unstable No - Continue assessment No - Continue assessment No - Continue assessment     What is the highest level of mobility based on the progressive mobility assessment? Level 4 (Walks with assist in room) - Balance while marching in place and cannot step forward and back - Complete Level 4 (Walks with assist in room) - Balance while marching in place and cannot step forward and back - Complete Level 4 (Walks with assist in room) - Balance while marching in place and cannot step forward and back - Complete             Mobility Assessment (Last 72 Hours)     Mobility Assessment     Row Name 12/20/22 0800 12/20/22 0000 12/19/22 2000 12/19/22 0800 12/19/22 0000   Does patient have an order for bedrest or is patient medically unstable No - Continue assessment No - Continue assessment No - Continue assessment No - Continue assessment No - Continue assessment   What is the highest level of mobility based on the progressive  mobility assessment? Level 4 (Walks with assist in room) - Balance while marching in place and cannot step forward and back - Complete Level 4 (Walks with assist in room) - Balance while marching in place and cannot step forward and back - Complete Level 4 (Walks with assist in room) - Balance while marching in place and cannot step forward and back - Complete Level 4 (Walks with assist in room) - Balance while marching in place and cannot step forward and back - Complete Level 4 (Walks with assist in room) - Balance while marching in place and cannot step forward and back - Complete    Row Name 12/18/22 2000 12/18/22 0822 12/17/22 1958       Does patient have an order for bedrest or is patient medically unstable No - Continue assessment No - Continue assessment No - Continue assessment     What is the highest level of mobility based on the progressive mobility assessment? Level 4 (Walks with assist in room) - Balance while marching in place and cannot step forward and back - Complete Level 4 (Walks with assist in room) -  Balance while marching in place and cannot step forward and back - Complete Level 4 (Walks with assist in room) - Balance while marching in place and cannot step forward and back - Complete             Mobility Assessment (Last 72 Hours)     Mobility Assessment     Row Name 12/20/22 0800 12/20/22 0000 12/19/22 2000 12/19/22 0800 12/19/22 0000   Does patient have an order for bedrest or is patient medically unstable No - Continue assessment No - Continue assessment No - Continue assessment No - Continue assessment No - Continue assessment   What is the highest level of mobility based on the progressive mobility assessment? Level 4 (Walks with assist in room) - Balance while marching in place and cannot step forward and back - Complete Level 4 (Walks with assist in room) - Balance while marching in place and cannot step forward and back - Complete Level 4 (Walks with assist in  room) - Balance while marching in place and cannot step forward and back - Complete Level 4 (Walks with assist in room) - Balance while marching in place and cannot step forward and back - Complete Level 4 (Walks with assist in room) - Balance while marching in place and cannot step forward and back - Complete    Row Name 12/18/22 2000 12/18/22 0822 12/17/22 1958       Does patient have an order for bedrest or is patient medically unstable No - Continue assessment No - Continue assessment No - Continue assessment     What is the highest level of mobility based on the progressive mobility assessment? Level 4 (Walks with assist in room) - Balance while marching in place and cannot step forward and back - Complete Level 4 (Walks with assist in room) - Balance while marching in place and cannot step forward and back - Complete Level 4 (Walks with assist in room) - Balance while marching in place and cannot step forward and back - Complete              Barriers to discharge: none Disposition Plan:  Home Status is: Inpt  Objective: Blood pressure (!) 169/64, pulse 71, temperature 98 F (36.7 C), resp. rate 18, height 5\' 11"  (1.803 m), weight 55.3 kg, SpO2 99%.  Examination:  Physical Exam Constitutional:      General: He is not in acute distress.    Appearance: Normal appearance.  HENT:     Head: Normocephalic and atraumatic.     Mouth/Throat:     Mouth: Mucous membranes are moist.  Eyes:     Extraocular Movements: Extraocular movements intact.  Cardiovascular:     Rate and Rhythm: Normal rate and regular rhythm.  Pulmonary:     Effort: Pulmonary effort is normal. No respiratory distress.     Breath sounds: Normal breath sounds. No wheezing.  Abdominal:     General: Bowel sounds are normal. There is no distension.     Palpations: Abdomen is soft.     Tenderness: There is no abdominal tenderness.  Musculoskeletal:     Cervical back: Normal range of motion and neck supple.      Comments: Left foot/ankle noted with continued edema and calor.  Mild erythema.  Decreased ROM due to pain and swelling  Skin:    General: Skin is warm and dry.  Neurological:     General: No focal deficit present.     Mental Status: He is  alert.  Psychiatric:        Mood and Affect: Mood normal.        Behavior: Behavior normal.      Consultants:  ID General surgery   Procedures:    Data Reviewed: Results for orders placed or performed during the hospital encounter of 12/15/22 (from the past 24 hour(s))  Basic metabolic panel     Status: Abnormal   Collection Time: 12/20/22  4:14 AM  Result Value Ref Range   Sodium 128 (L) 135 - 145 mmol/L   Potassium 3.4 (L) 3.5 - 5.1 mmol/L   Chloride 100 98 - 111 mmol/L   CO2 24 22 - 32 mmol/L   Glucose, Bld 94 70 - 99 mg/dL   BUN 11 8 - 23 mg/dL   Creatinine, Ser 1.61 0.61 - 1.24 mg/dL   Calcium 8.2 (L) 8.9 - 10.3 mg/dL   GFR, Estimated >09 >60 mL/min   Anion gap 4 (L) 5 - 15  Magnesium     Status: None   Collection Time: 12/20/22  4:14 AM  Result Value Ref Range   Magnesium 2.0 1.7 - 2.4 mg/dL  Culture, blood (Routine X 2) w Reflex to ID Panel     Status: None (Preliminary result)   Collection Time: 12/20/22 11:09 AM   Specimen: BLOOD  Result Value Ref Range   Specimen Description BLOOD LEFT ANTECUBITAL    Special Requests      BOTTLES DRAWN AEROBIC AND ANAEROBIC Blood Culture results may not be optimal due to an excessive volume of blood received in culture bottles Performed at [facility], [address] [place], [place] [address]    Culture PENDING    Report Status PENDING   Culture, blood (Routine X 2) w Reflex to ID Panel     Status: None (Preliminary result)   Collection Time: 12/20/22 11:09 AM   Specimen: BLOOD  Result Value Ref Range   Specimen Description BLOOD BLOOD RIGHT HAND    Special Requests      BOTTLES DRAWN AEROBIC AND ANAEROBIC Blood Culture adequate volume Performed at [facility], [address] [place], [place] [address]    Culture PENDING    Report Status PENDING    *Note: Due to a large number of results and/or encounters for the requested time period, some results have not been displayed. A complete set of results can be found in Results Review.     I have reviewed pertinent nursing notes, vitals, labs, and images as necessary. I have ordered labwork to follow up on as indicated.  I have reviewed the last notes from staff over past 24 hours. I have discussed patient's care plan and test results with nursing staff, CM/SW, and other staff as appropriate.  Time spent: Greater than 50% of the 55 minute visit was spent in counseling/coordination of care for the patient as laid out in the A&P.   LOS: 4 days   Dale Chamber, MD [facility] 12/20/2022, 5:57 PM

## 2022-12-20 NOTE — Progress Notes (Signed)
Please see attestation to consult note from yesterday.  Patient seen and examined.  He is resting comfortably in bed.  He was admitted to the hospital with left lower extremity cellulitis.  He was noted to have 2 of 4 blood cultures positive for Achromobacter dentrificans.  The hospitalist discussed the patient's case with infectious disease, who is recommending removal of the Port-A-Cath and then subsequent treatment without Port-A-Cath for 10 days to verify infection has completely cleared.  Patient has this Port-A-Cath in place for treatment of multiple myeloma.  He last received an infusion on 8/8.  This Port-A-Cath was placed by IR in September 2023, at which time a right IJ Port-A-Cath was removed and replaced with a left IJ Port-A-Cath.  The right IJ Port-A-Cath was removed secondary to issues with the Port-A-Cath leaking.  His past medical history is significant for multiple myeloma, CHF, hypertension, hyperlipidemia, and hypothyroidism.  Patient denies use of blood thinning medications.   Exam: Chest: Left IJ Port-A-Cath in place without erythema, tenderness, or induration MSK: Left lower extremity with minimal swelling and erythema, mild tenderness to palpation   Assessment and plan: Patient is a 89 year old male who was admitted with left lower extremity cellulitis.  He subsequently developed bacteremia with Achromobacter denitrificans.   -ID recommending removal of Port-A-Cath -Plan for removal of Port-A-Cath this morning -Patient has other IV access in place -The risks and benefits of left IJ Port-A-Cath removal were discussed with the patient, including but not limited to bleeding, infection, injury to surrounding structures, need for additional procedures.  After careful consideration, Dale [name] has decided to proceed with surgery.  I also discussed the procedure on the phone with the patient's wife, Dale [name], who was also providing verbal consent over the phone for removal of left  IJ Port-A-Cath. -Antibiotics per ID -Care per primary team   Theophilus Kinds, DO [facility] [address] [place], [place] [address] [phone] (office)

## 2022-12-20 NOTE — Procedures (Deleted)
[facility] Procedure Note   12/20/22   Pre-procedure Diagnosis: Port in place, bacteremia    Post-procedure Diagnosis: Bacteremia   Procedure(s) Performed: Left internal jugular Port a catheter removal    Surgeon: Theophilus Kinds, DO   Assistants: No qualified resident was available    Anesthesia: Lidocaine 1%    Specimens:  None    Estimated Blood Loss: Minimal   Wound Class: Dirty, Infected bacteremia    Procedure Indications: Dale [name] is a 89 y.o. with a port in place for multiple myeloma. He needs his port removed secondary to bacteremia. We discussed removal and risk of bleeding, infection, incomplete removal, pneumothorax. He opted to proceed.    Findings: Normal appearing port removed completely, cavity hemostatic    Procedure: The patient was taken to the procedure room and placed supine. The left chest and neck were prepared and draped in the usual sterile fashion. Lidocaine 1% was injected at the port incision site and into the cavity around the port.     An incision was made. Using sharp and blunt dissection, the port was removed and the catheter slid out easily. My assistant held pressure for 10 minutes at the internal jugular entry site. Hemostasis was achieved with electrocautery.  The skin was closed with interrupted 3-0 Vicryl.  The incision was dressed with 4x4 and Medipore tape.   Final inspection revealed acceptable hemostasis. The patient tolerated the procedure well.    Theophilus Kinds, DO [facility] [address] [place], [place] [address] [phone] (office)

## 2022-12-20 NOTE — Op Note (Addendum)
[facility] Procedure Note   12/20/22   Pre-procedure Diagnosis: Port in place, bacteremia    Post-procedure Diagnosis: Bacteremia   Procedure(s) Performed: Left internal jugular Port a catheter removal    Surgeon: Theophilus Kinds, DO   Assistants: No qualified resident was available    Anesthesia: Lidocaine 1%    Specimens:  None    Estimated Blood Loss: Minimal   Wound Class: Dirty, Infected bacteremia    Procedure Indications: Dale [name] is a 89 y.o. with a port in place for multiple myeloma. He needs his port removed secondary to bacteremia. We discussed removal and risk of bleeding, infection, incomplete removal, pneumothorax. He opted to proceed.    Findings: Normal appearing port removed completely, cavity hemostatic    Procedure: The patient was taken to the procedure room and placed supine. The left chest and neck were prepared and draped in the usual sterile fashion. Lidocaine 1% was injected at the port incision site and into the cavity around the port.     An incision was made. Using sharp and blunt dissection, the port was removed and the catheter slid out easily. My assistant held pressure for 10 minutes at the internal jugular entry site. Hemostasis was achieved with electrocautery.  The skin was closed with interrupted 3-0 Vicryl.  The incision was dressed with 4x4 and Medipore tape.   Final inspection revealed acceptable hemostasis. The patient tolerated the procedure well.    Theophilus Kinds, DO [facility] [address] [place] [place], [place] [address] [phone] (office)

## 2022-12-20 NOTE — Progress Notes (Signed)
Id brief note    Port removed this morning Will repeat blood cx  Can transition abx to bactrim and finish 10 more day to finish on 8/25  Will sign off  Discuss with primary team

## 2022-12-20 NOTE — Progress Notes (Signed)
[facility]  Spoke with the patient at bedside at the completion of the procedure.  Explained that the port is out, and I will check on his incision site tomorrow.  The hospitalist and ID physician will determine the remainder of his care regarding his bacteremia.  Keep area covered today.  All questions answered to the patient's expressed satisfaction.  Theophilus Kinds, DO [facility] [address] [place], [place] [address] [phone] (office)

## 2022-12-20 NOTE — Progress Notes (Signed)
Patient had port a cath removed, tolerated procedure well. Dressing to site clean dry and intact. Up in chair on this shift. No events on this shift.

## 2022-12-20 NOTE — TOC Progression Note (Signed)
Transition of Care [facility]) - Progression Note    Patient Details  Name: Dale [name] MRN: [id number] Date of Birth: [birth year]  [facility] CM/SW Contact  Elliot Gault, LCSW Phone Number: 12/20/2022, 11:24 AM  Clinical Narrative:     TOC following. Per MD, pt may be stable for dc over the weekend.   Spoke with pt's wife at bedside to review dc planning and update on possible weekend dc. Pt's wife remains in agreement with transfer to [facility] at [facility].   Updated Debbie at [facility]. Weekend TOC will follow.  Expected Discharge Plan: Skilled Nursing Facility Barriers to Discharge: Continued Medical Work up  Expected Discharge Plan and Services In-house Referral: Clinical Social Work   Post Acute Care Choice: Skilled Nursing Facility Living arrangements for the past 2 months: Single Family Home                                       Social Determinants of Health (SDOH) Interventions SDOH Screenings   Food Insecurity: No Food Insecurity (12/15/2022)  Housing: Low Risk  (12/15/2022)  Transportation Needs: No Transportation Needs (12/15/2022)  Utilities: Not At Risk (12/15/2022)  Financial Resource Strain: Low Risk  (12/02/2022)  Physical Activity: Inactive (11/25/2022)  Social Connections: Unknown (09/18/2021)   Received from [facility]  Stress: No Stress Concern Present (10/16/2021)   Received from [facility]  Tobacco Use: Medium Risk (12/15/2022)    Readmission Risk Interventions    11/18/2022    3:29 PM 07/30/2021   10:16 AM  Readmission Risk Prevention Plan  Transportation Screening Complete Complete  PCP or Specialist Appt within 3-5 Days Not Complete   HRI or Home Care Consult Complete Complete  Social Work Consult for Recovery Care Planning/Counseling Complete Complete  Palliative Care Screening Not Applicable Not Applicable  Medication Review Oceanographer) Complete Complete

## 2022-12-21 DIAGNOSIS — G9341 Metabolic encephalopathy: Secondary | ICD-10-CM | POA: Diagnosis not present

## 2022-12-21 DIAGNOSIS — R7881 Bacteremia: Secondary | ICD-10-CM | POA: Diagnosis not present

## 2022-12-21 DIAGNOSIS — L03116 Cellulitis of left lower limb: Secondary | ICD-10-CM | POA: Diagnosis not present

## 2022-12-21 LAB — BASIC METABOLIC PANEL
Anion gap: 5 (ref 5–15)
BUN: 11 mg/dL (ref 8–23)
CO2: 25 mmol/L (ref 22–32)
Calcium: 8.3 mg/dL — ABNORMAL LOW (ref 8.9–10.3)
Chloride: 98 mmol/L (ref 98–111)
Creatinine, Ser: 0.7 mg/dL (ref 0.61–1.24)
GFR, Estimated: >60 mL/min (ref >60)
Glucose, Bld: 94 mg/dL (ref 70–99)
Potassium: 3 mmol/L — ABNORMAL LOW (ref 3.5–5.1)
Sodium: 128 mmol/L — ABNORMAL LOW (ref 135–145)

## 2022-12-21 LAB — MAGNESIUM: Magnesium: 2 mg/dL (ref 1.7–2.4)

## 2022-12-21 MED ORDER — POTASSIUM CHLORIDE CRYS ER 20 MEQ PO TBCR
40.0000 meq | EXTENDED_RELEASE_TABLET | Freq: Once | ORAL | Status: AC
  Administered 2022-12-21: 40 meq via ORAL
  Filled 2022-12-21: qty 2

## 2022-12-21 MED ORDER — OLANZAPINE 10 MG IM SOLR
5.0000 mg | Freq: Once | INTRAMUSCULAR | Status: DC
  Filled 2022-12-21: qty 10

## 2022-12-21 MED ORDER — HALOPERIDOL LACTATE 5 MG/ML IJ SOLN
2.0000 mg | Freq: Once | INTRAMUSCULAR | Status: AC
  Administered 2022-12-21: 2 mg via INTRAVENOUS
  Filled 2022-12-21: qty 1

## 2022-12-21 MED ORDER — OLANZAPINE 10 MG IM SOLR: 5.0000 mg | Freq: Once | INTRAMUSCULAR | Status: DC | PRN

## 2022-12-21 NOTE — Progress Notes (Signed)
Progress Note    Dale [name]   RUE:[id number]  DOB: November 09, 1928  DOA: 12/15/2022     5 PCP: Elfredia Nevins, MD  Initial CC: left foot/ankle pain  Hospital Course: Dale [name] is a 89 y.o. male with a history of multiple myeloma undergoing chemotherapy, ITP, CAD, HTN, HLD, hyponatremia, hypothyroidism, BPH who presented to the ED on 12/15/2022 with confusion found to have suspected cellulitis of the lower left leg/ankle.  Blood cultures have grown GNRs in aerobic bottles. MRI showed no osteomyelitis or abscess at the ankle, and cellulitis appears to be improving.  Blood cultures speciated out to Achromobacter dentrificans.  Case was discussed with infectious disease and he was recommended for having his port removed which took place on 12/20/2022 with general surgery.  Oncology was also made aware of recommendations. Repeat blood cultures were obtained after port removal.  Interval History:  Became confused and agitated with staff overnight. Not much effect with haldol.  Mittens on this am but he's able to carry on conversation and redirect easily. Wife also arriving during my conversation.  Left foot continues to improve and overall he's clinically stable and improved.  If repeat blood cultures remain negative tomorrow, he should be okay for going to [facility].    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  Both growing same organism as noted above - given atypical organism, have asked for ID input as well;  appreciate assistance - recommendation is for port removal given high risk for seeding and/or biofilm.  Port removed on 12/20/2022 with general surgery - Repeat blood cultures obtained after port removal.  Watch for 48 hours -Meropenem transitioned to Bactrim per ID on 12/20/2022.  Plan is for total of 10-day course after port removal   Acute metabolic encephalopathy With acute and more subacute components, possible neurodegenerative disease/dementia which puts him at higher risk of delirium with infections, etc. CT head with cerebral volume loss and small vessel ischemic changes without acute findings. - Delirium precautions - Treat infectious etiology as above and monitor. - some delirium overnight; has dose of zyprexa in case recurs - family present this morning which should help - Continue with plans for outpatient neurology evaluation   Left lower lobe pulmonary opacity (4.6 x 1.3 x 2.6cm) - This appears smaller than opacity November 17, 2022 which was treated with antibiotics. Would recommend repeat CT chest in 3 months (vs. PET vs. tissue sampling [which would be high risk w/thrombocytopenia])   Hyponatremia: Chronic. ?SIADH w/pulmonary opacity - Fluid restriction.    Pancytopenia, macrocytic anemia, thrombocytopenia: Has history of ITP - Monitor daily. 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    Multiple myeloma: Undergoing chemotherapy, last infusion 8/8 - Follows up with Dr. Ellin Saba - Continue prophylactic acyclovir unless renal function worsens    CAD  - Continue long acting nitrate, BB, ASA, statin   HTN - Continue home imdur, metoprolol (ECG at admission w/sinus tachycardia, telemetry showing NSR and 1st deg AVB).    Hypothyroidism - Continue synthroid  -TSH mildly elevated but free T4 normal  HLD - Continue statin   DNR: POA   BPH: Not retaining on PVR    Old records reviewed in assessment of this patient  Antimicrobials: Ancef 8/11 >> 8/12 Rocephin 8/11 >> 8/15 Meropenem 8/15  >> 8/16 Bactrim 8/16 >> current     DVT prophylaxis:  SCDs Start: 12/15/22 1939   Code Status:   Code Status: DNR  Mobility Assessment (Last 72 Hours)     Mobility Assessment     Row Name 12/20/22 1937 12/20/22 0800 12/20/22 0000 12/19/22 2000 12/19/22 0800   Does patient have an order for bedrest or is patient medically unstable No - Continue assessment No - Continue assessment No - Continue assessment No - Continue assessment No - Continue assessment   What is the highest level of mobility based on the progressive mobility assessment? Level 4 (Walks with assist in room) - Balance while marching in place and cannot step forward and back - Complete Level 4 (Walks with assist in room) - Balance while marching in place and cannot step forward and back - Complete Level 4 (Walks with assist in room) - Balance while marching in place and cannot step forward and back - Complete Level 4 (Walks with assist in room) - Balance while marching in place and cannot step forward and back - Complete Level 4 (Walks with assist in room) - Balance while marching in place and cannot step forward and back - Complete    Row Name 12/19/22 0000 12/18/22 2000         Does patient have an order for bedrest or is patient medically unstable No - Continue assessment No - Continue assessment      What is the highest level of mobility based on the progressive mobility assessment? Level 4 (Walks with assist in room) - Balance while marching in place and cannot step forward and back - Complete Level 4 (Walks with assist in room) - Balance while marching in place and cannot step forward and back - Complete              Mobility Assessment (Last 72 Hours)     Mobility Assessment     Row Name 12/20/22 1937 12/20/22 0800 12/20/22 0000 12/19/22 2000 12/19/22 0800   Does patient have an order for bedrest or is patient medically unstable No - Continue assessment No - Continue assessment No - Continue assessment No -  Continue assessment No - Continue assessment   What is the highest level of mobility based on the progressive mobility assessment? Level 4 (Walks with assist in room) - Balance while marching in place and cannot step forward and back - Complete Level 4 (Walks with assist in room) - Balance while marching in place and cannot step forward and back - Complete Level 4 (Walks with assist in room) - Balance while marching in place and cannot step forward and back - Complete Level 4 (Walks with assist in room) - Balance while marching in place and cannot step forward and back - Complete Level 4 (Walks with assist in room) - Balance while marching in place and cannot step forward and back - Complete    Row Name 12/19/22 0000 12/18/22 2000         Does patient have an order for bedrest or is patient medically unstable No - Continue assessment No - Continue assessment      What is the highest level of mobility based on the progressive mobility assessment? Level 4 (Walks with assist in  room) - Balance while marching in place and cannot step forward and back - Complete Level 4 (Walks with assist in room) - Balance while marching in place and cannot step forward and back - Complete              Mobility Assessment (Last 72 Hours)     Mobility Assessment     Row Name 12/20/22 1937 12/20/22 0800 12/20/22 0000 12/19/22 2000 12/19/22 0800   Does patient have an order for bedrest or is patient medically unstable No - Continue assessment No - Continue assessment No - Continue assessment No - Continue assessment No - Continue assessment   What is the highest level of mobility based on the progressive mobility assessment? Level 4 (Walks with assist in room) - Balance while marching in place and cannot step forward and back - Complete Level 4 (Walks with assist in room) - Balance while marching in place and cannot step forward and back - Complete Level 4 (Walks with assist in room) - Balance while marching in place  and cannot step forward and back - Complete Level 4 (Walks with assist in room) - Balance while marching in place and cannot step forward and back - Complete Level 4 (Walks with assist in room) - Balance while marching in place and cannot step forward and back - Complete    Row Name 12/19/22 0000 12/18/22 2000         Does patient have an order for bedrest or is patient medically unstable No - Continue assessment No - Continue assessment      What is the highest level of mobility based on the progressive mobility assessment? Level 4 (Walks with assist in room) - Balance while marching in place and cannot step forward and back - Complete Level 4 (Walks with assist in room) - Balance while marching in place and cannot step forward and back - Complete               Barriers to discharge: none Disposition Plan:  Home Status is: Inpt  Objective: Blood pressure (!) 176/75, pulse 78, temperature 98.2 F (36.8 C), temperature source Oral, resp. rate 18, height 5\' 11"  (1.803 m), weight 54.1 kg, SpO2 100%.  Examination:  Physical Exam Constitutional:      General: He is not in acute distress.    Appearance: Normal appearance.  HENT:     Head: Normocephalic and atraumatic.     Mouth/Throat:     Mouth: Mucous membranes are moist.  Eyes:     Extraocular Movements: Extraocular movements intact.  Cardiovascular:     Rate and Rhythm: Normal rate and regular rhythm.  Pulmonary:     Effort: Pulmonary effort is normal. No respiratory distress.     Breath sounds: Normal breath sounds. No wheezing.  Abdominal:     General: Bowel sounds are normal. There is no distension.     Palpations: Abdomen is soft.     Tenderness: There is no abdominal tenderness.  Musculoskeletal:     Cervical back: Normal range of motion and neck supple.     Comments: Left foot/ankle noted with much improved edema and calor. No further erythema.  Decreased ROM due to pain but overall improved too  Skin:    General:  Skin is warm and dry.  Neurological:     General: No focal deficit present.     Mental Status: He is alert.  Psychiatric:        Mood and Affect: Mood  normal.        Behavior: Behavior normal.      Consultants:  ID General surgery   Procedures:  8/16: Port removal   Data Reviewed: Results for orders placed or performed during the hospital encounter of 12/15/22 (from the past 24 hour(s))  Basic metabolic panel     Status: Abnormal   Collection Time: 12/21/22  4:25 AM  Result Value Ref Range   Sodium 128 (L) 135 - 145 mmol/L   Potassium 3.0 (L) 3.5 - 5.1 mmol/L   Chloride 98 98 - 111 mmol/L   CO2 25 22 - 32 mmol/L   Glucose, Bld 94 70 - 99 mg/dL   BUN 11 8 - 23 mg/dL   Creatinine, Ser 6.29 0.61 - 1.24 mg/dL   Calcium 8.3 (L) 8.9 - 10.3 mg/dL   GFR, Estimated >52 >84 mL/min   Anion gap 5 5 - 15  Magnesium     Status: None   Collection Time: 12/21/22  4:25 AM  Result Value Ref Range   Magnesium 2.0 1.7 - 2.4 mg/dL   *Note: Due to a large number of results and/or encounters for the requested time period, some results have not been displayed. A complete set of results can be found in Results Review.     I have reviewed pertinent nursing notes, vitals, labs, and images as necessary. I have ordered labwork to follow up on as indicated.  I have reviewed the last notes from staff over past 24 hours. I have discussed patient's care plan and test results with nursing staff, CM/SW, and other staff as appropriate.    LOS: 5 days   Lewie Chamber, MD [facility] 12/21/2022, 12:48 PM

## 2022-12-21 NOTE — Progress Notes (Signed)
[facility] Progress Note  1 Day Post-Op  Subjective: Patient seen and examined.  He is resting comfortably in bed.  He is currently wearing mitts, as he became more confused overnight and was combative with staff.  He denies any complaints at this time.  Family is at bedside.  Objective: Vital signs in last 24 hours: Temp:  [98 F (36.7 C)-98.2 F (36.8 C)] 98.2 F (36.8 C) (08/17 0502) Pulse Rate:  [71-78] 78 (08/17 0502) Resp:  [18] 18 (08/17 0502) BP: (169-177)/(64-75) 176/75 (08/17 0502) SpO2:  [99 %-100 %] 100 % (08/17 0502) Weight:  [54.1 kg] 54.1 kg (08/17 0413) Last BM Date : 12/20/22  Intake/Output from previous day: 08/16 0701 - 08/17 0700 In: 480 [P.O.:480] Out: 600 [Urine:600] Intake/Output this shift: No intake/output data recorded.  General appearance: alert, distracted, and no distress Chest wall: Left Port-A-Cath site is C/D/I with gauze in place, no significant ecchymosis or erythema, no drainage  Lab Results:  No results for input(s): "WBC", "HGB", "HCT", "PLT" in the last 72 hours. BMET Recent Labs    12/20/22 0414 12/21/22 0425  NA 128* 128*  K 3.4* 3.0*  CL 100 98  CO2 24 25  GLUCOSE 94 94  BUN 11 11  CREATININE 0.70 0.70  CALCIUM 8.2* 8.3*   PT/INR No results for input(s): "LABPROT", "INR" in the last 72 hours.  Studies/Results: No results found.  Anti-infectives: Anti-infectives (From admission, onward)    Start     Dose/Rate Route Frequency Ordered Stop   12/20/22 2100  sulfamethoxazole-trimethoprim (BACTRIM DS) 800-160 MG per tablet 1 tablet        1 tablet Oral Every 12 hours 12/20/22 1026 12/29/22 0959   12/19/22 1045  meropenem (MERREM) 1 g in sodium chloride 0.9 % 100 mL IVPB  Status:  Discontinued        1 g 200 mL/hr over 30 Minutes Intravenous Every 12 hours 12/19/22 0951 12/20/22 1026   12/16/22 1500  cefTRIAXone (ROCEPHIN) 2 g in sodium chloride 0.9 % 100 mL IVPB  Status:  Discontinued        2 g 200  mL/hr over 30 Minutes Intravenous Every 24 hours 12/16/22 1402 12/19/22 0951   12/15/22 2200  acyclovir (ZOVIRAX) tablet 400 mg        400 mg Oral 2 times daily 12/15/22 1939     12/15/22 1630  ceFAZolin (ANCEF) IVPB 1 g/50 mL premix  Status:  Discontinued        1 g 100 mL/hr over 30 Minutes Intravenous Every 8 hours 12/15/22 1608 12/16/22 1402   12/15/22 1345  cefTRIAXone (ROCEPHIN) 1 g in sodium chloride 0.9 % 100 mL IVPB        1 g 200 mL/hr over 30 Minutes Intravenous  Once 12/15/22 1330 12/15/22 1527       Assessment/Plan:  Patient is a 89 year old male who was admitted with left lower extremity cellulitis.  He subsequently developed bacteremia with a Achromobacter denitrificans.  He is status post left IJ Port-A-Cath removal on 8/16.  -Site is healing well -May remove gauze dressing tomorrow and leave area open to air -Would avoid submersion of the area for 2 weeks after removal.  If patient is able to shower, would keep the area covered for 2 weeks -Discussed with family that need for Port-A-Cath reinsertion will depend on their want to pursue further treatments for his multiple myeloma.   -Antibiotics per ID and hospitalist -Care per primary team -General Surgery to  sign off.  Please call with any questions or concerns   LOS: 5 days    Gurnoor Ursua A Marceil Welp 12/21/2022

## 2022-12-21 NOTE — Progress Notes (Signed)
Patient has been confused and impulsive early in shift but has worsened as shift progressed. Patient is confused this am and has required 1:1 staff this am. Patient cannot be reoriented and is constantly attempting to get out of bed without assistance.  Patient states he is ready to go home and is trying to leave.  Patient has begun to swung at staff and is verbally aggressive.  Notified MD of patient behavior.

## 2022-12-22 DIAGNOSIS — G9341 Metabolic encephalopathy: Secondary | ICD-10-CM | POA: Diagnosis not present

## 2022-12-22 DIAGNOSIS — L03116 Cellulitis of left lower limb: Secondary | ICD-10-CM | POA: Diagnosis not present

## 2022-12-22 DIAGNOSIS — R7881 Bacteremia: Secondary | ICD-10-CM | POA: Diagnosis not present

## 2022-12-22 NOTE — Plan of Care (Signed)

## 2022-12-22 NOTE — TOC Progression Note (Signed)
Transition of Care [facility]) - Progression Note    Patient Details  Name: Dale [name] MRN: [id number] Date of Birth: Feb 05, 1929  [facility] CM/SW Contact  Catalina Gravel, LCSW Phone Number: 12/22/2022, 11:39 AM  Clinical Narrative:     MD stated that pt possibly ready for DC, and inquired if [facility] can accept. CSW contacted Debbie at [facility]. Eunice Blase states that they can accept pt Mon.  TOC to follow.   Expected Discharge Plan: Skilled Nursing Facility Barriers to Discharge: Continued Medical Work up  Expected Discharge Plan and Services In-house Referral: Clinical Social Work   Post Acute Care Choice: Skilled Nursing Facility Living arrangements for the past 2 months: Single Family Home                                       Social Determinants of Health (SDOH) Interventions SDOH Screenings   Food Insecurity: No Food Insecurity (12/15/2022)  Housing: Low Risk  (12/15/2022)  Transportation Needs: No Transportation Needs (12/15/2022)  Utilities: Not At Risk (12/15/2022)  Financial Resource Strain: Low Risk  (12/02/2022)  Physical Activity: Inactive (11/25/2022)  Social Connections: Unknown (09/18/2021)   Received from [facility]  Stress: No Stress Concern Present (10/16/2021)   Received from [facility]  Tobacco Use: Medium Risk (12/15/2022)    Readmission Risk Interventions    11/18/2022    3:29 PM 07/30/2021   10:16 AM  Readmission Risk Prevention Plan  Transportation Screening Complete Complete  PCP or Specialist Appt within 3-5 Days Not Complete   HRI or Home Care Consult Complete Complete  Social Work Consult for Recovery Care Planning/Counseling Complete Complete  Palliative Care Screening Not Applicable Not Applicable  Medication Review Oceanographer) Complete Complete

## 2022-12-22 NOTE — Progress Notes (Signed)
Progress Note    Dale [name]   GMW:[id number]  DOB: 1928-07-25  DOA: 12/15/2022     6 PCP: Elfredia Nevins, MD  Initial CC: left foot/ankle pain  Hospital Course: Dale [name] is a 89 y.o. male with a history of multiple myeloma undergoing chemotherapy, ITP, CAD, HTN, HLD, hyponatremia, hypothyroidism, BPH who presented to the ED on 12/15/2022 with confusion found to have suspected cellulitis of the lower left leg/ankle.  Blood cultures have grown GNRs in aerobic bottles. MRI showed no osteomyelitis or abscess at the ankle, and cellulitis appears to be improving.  Blood cultures speciated out to Achromobacter dentrificans.  Case was discussed with infectious disease and he was recommended for having his port removed which took place on 12/20/2022 with general surgery.  Oncology was also made aware of recommendations. Repeat blood cultures were obtained after port removal and remained negative.   Interval History:  Still having some delirium and impulsivity. Wife present and helps keep him re-directed.  Left foot much better today even. No further swelling now.  CV could not accept him today so we'll plan on tomorrow.   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  Both growing same organism as noted above - given atypical organism, have asked for ID input as well; appreciate assistance - recommendation is for port removal given high risk for seeding and/or biofilm.  Port removed on 12/20/2022  with general surgery - blood cultures repeated 8/16 and remain negative x 2 days -Meropenem transitioned to Bactrim per ID on 12/20/2022.  Plan is for total of 10-day course after port removal   Acute metabolic encephalopathy With acute and more subacute components, possible neurodegenerative disease/dementia which puts him at higher risk of delirium with infections, etc. CT head with cerebral volume loss and small vessel ischemic changes without acute findings. - Delirium precautions - Treat infectious etiology as above and monitor. - some delirium overnight; has dose of zyprexa in case recurs - family present this morning which should help - Continue with plans for outpatient neurology evaluation   Left lower lobe pulmonary opacity (4.6 x 1.3 x 2.6cm) - This appears smaller than opacity November 17, 2022 which was treated with antibiotics. Would recommend repeat CT chest in 3 months (vs. PET vs. tissue sampling [which would be high risk w/thrombocytopenia])   Hyponatremia: Chronic. ?SIADH w/pulmonary opacity - Fluid restriction.    Pancytopenia, macrocytic anemia, thrombocytopenia: Has history of ITP - Monitor daily. 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    Multiple myeloma: Undergoing chemotherapy, last infusion 8/8 - Follows up with Dr. Ellin Saba - Continue prophylactic acyclovir unless renal function worsens  - port removed 12/20/22 for above    CAD  - Continue long acting nitrate, BB, ASA, statin   HTN - Continue home imdur, metoprolol (ECG at admission w/sinus tachycardia, telemetry showing NSR and 1st deg AVB).    Hypothyroidism - Continue synthroid  -TSH mildly elevated but free T4 normal   HLD - Continue statin   DNR: POA   BPH: Not retaining on PVR  Old records reviewed in assessment of this patient  Antimicrobials: Ancef 8/11 >> 8/12 Rocephin 8/11 >> 8/15 Meropenem 8/15 >> 8/16 Bactrim 8/16 >> current     DVT prophylaxis:  SCDs Start: 12/15/22 1939   Code Status:   Code  Status: DNR  Mobility Assessment (Last 72 Hours)     Mobility Assessment     Row Name 12/22/22 0832 12/21/22 2131 12/20/22 1937 12/20/22 0800 12/20/22 0000   Does patient have an order for bedrest or is patient medically unstable No - Continue assessment No - Continue assessment No - Continue assessment No - Continue assessment No - Continue assessment   What is the highest level of mobility based on the progressive mobility assessment? Level 4 (Walks with assist in room) - Balance while marching in place and cannot step forward and back - Complete Level 4 (Walks with assist in room) - Balance while marching in place and cannot step forward and back - Complete Level 4 (Walks with assist in room) - Balance while marching in place and cannot step forward and back - Complete Level 4 (Walks with assist in room) - Balance while marching in place and cannot step forward and back - Complete Level 4 (Walks with assist in room) - Balance while marching in place and cannot step forward and back - Complete    Row Name 12/19/22 2000           Does patient have an order for bedrest or is patient medically unstable No - Continue assessment       What is the highest level of mobility based on the progressive mobility assessment? Level 4 (Walks with assist in room) - Balance while marching in place and cannot step forward and back - Complete               Mobility Assessment (Last 72 Hours)     Mobility Assessment     Row Name 12/22/22 0832 12/21/22 2131 12/20/22 1937 12/20/22 0800 12/20/22 0000   Does patient have an order for bedrest or is patient medically unstable No - Continue assessment No - Continue assessment No - Continue assessment No - Continue assessment No - Continue assessment   What is the highest level of mobility based on the progressive mobility assessment? Level 4 (Walks with assist in room) - Balance while marching in place and cannot step forward and back - Complete Level 4 (Walks  with assist in room) - Balance while marching in place and cannot step forward and back - Complete Level 4 (Walks with assist in room) - Balance while marching in place and cannot step forward and back - Complete Level 4 (Walks with assist in room) - Balance while marching in place and cannot step forward and back - Complete Level 4 (Walks with assist in room) - Balance while marching in place and cannot step forward and back - Complete    Row Name 12/19/22 2000           Does patient have an order for bedrest or is patient medically unstable No - Continue assessment       What is the highest level of mobility based on the progressive mobility assessment? Level 4 (Walks with assist in room) - Balance while marching in place and cannot step forward and back - Complete               Mobility Assessment (Last 72 Hours)     Mobility Assessment  Row Name 12/22/22 8295 12/21/22 2131 12/20/22 1937 12/20/22 0800 12/20/22 0000   Does patient have an order for bedrest or is patient medically unstable No - Continue assessment No - Continue assessment No - Continue assessment No - Continue assessment No - Continue assessment   What is the highest level of mobility based on the progressive mobility assessment? Level 4 (Walks with assist in room) - Balance while marching in place and cannot step forward and back - Complete Level 4 (Walks with assist in room) - Balance while marching in place and cannot step forward and back - Complete Level 4 (Walks with assist in room) - Balance while marching in place and cannot step forward and back - Complete Level 4 (Walks with assist in room) - Balance while marching in place and cannot step forward and back - Complete Level 4 (Walks with assist in room) - Balance while marching in place and cannot step forward and back - Complete    Row Name 12/19/22 2000           Does patient have an order for bedrest or is patient medically unstable No - Continue  assessment       What is the highest level of mobility based on the progressive mobility assessment? Level 4 (Walks with assist in room) - Balance while marching in place and cannot step forward and back - Complete                Barriers to discharge: none Disposition Plan:  Home Status is: Inpt  Objective: Blood pressure 121/63, pulse 80, temperature 97.9 F (36.6 C), resp. rate 16, height 5\' 11"  (1.803 m), weight 53.9 kg, SpO2 100%.  Examination:  Physical Exam Constitutional:      General: He is not in acute distress.    Appearance: Normal appearance.  HENT:     Head: Normocephalic and atraumatic.     Mouth/Throat:     Mouth: Mucous membranes are moist.  Eyes:     Extraocular Movements: Extraocular movements intact.  Cardiovascular:     Rate and Rhythm: Normal rate and regular rhythm.  Pulmonary:     Effort: Pulmonary effort is normal. No respiratory distress.     Breath sounds: Normal breath sounds. No wheezing.  Abdominal:     General: Bowel sounds are normal. There is no distension.     Palpations: Abdomen is soft.     Tenderness: There is no abdominal tenderness.  Musculoskeletal:     Cervical back: Normal range of motion and neck supple.     Comments: Left foot/ankle noted with much improved edema and calor. No further erythema.  Decreased ROM due to pain but overall improved too  Skin:    General: Skin is warm and dry.  Neurological:     General: No focal deficit present.     Mental Status: He is alert.  Psychiatric:        Mood and Affect: Mood normal.        Behavior: Behavior normal.      Consultants:  ID General surgery   Procedures:  8/16: Port removal   Data Reviewed: No results found. However, due to the size of the patient record, not all encounters were searched. Please check Results Review for a complete set of results.    I have reviewed pertinent nursing notes, vitals, labs, and images as necessary. I have ordered labwork to follow  up on as indicated.  I have reviewed the last notes  from staff over past 24 hours. I have discussed patient's care plan and test results with nursing staff, CM/SW, and other staff as appropriate.    LOS: 6 days   Lewie Chamber, MD [facility] 12/22/2022, 3:20 PM

## 2022-12-23 ENCOUNTER — Ambulatory Visit: Payer: Self-pay | Admitting: *Deleted

## 2022-12-23 ENCOUNTER — Encounter (HOSPITAL_COMMUNITY): Payer: Self-pay | Admitting: Surgery

## 2022-12-23 DIAGNOSIS — T451X5A Adverse effect of antineoplastic and immunosuppressive drugs, initial encounter: Secondary | ICD-10-CM | POA: Diagnosis not present

## 2022-12-23 DIAGNOSIS — R41841 Cognitive communication deficit: Secondary | ICD-10-CM | POA: Diagnosis not present

## 2022-12-23 DIAGNOSIS — L299 Pruritus, unspecified: Secondary | ICD-10-CM | POA: Diagnosis not present

## 2022-12-23 DIAGNOSIS — H409 Unspecified glaucoma: Secondary | ICD-10-CM | POA: Diagnosis not present

## 2022-12-23 DIAGNOSIS — F03A0 Unspecified dementia, mild, without behavioral disturbance, psychotic disturbance, mood disturbance, and anxiety: Secondary | ICD-10-CM | POA: Diagnosis not present

## 2022-12-23 DIAGNOSIS — N492 Inflammatory disorders of scrotum: Secondary | ICD-10-CM | POA: Diagnosis not present

## 2022-12-23 DIAGNOSIS — R600 Localized edema: Secondary | ICD-10-CM | POA: Diagnosis not present

## 2022-12-23 DIAGNOSIS — E43 Unspecified severe protein-calorie malnutrition: Secondary | ICD-10-CM | POA: Diagnosis not present

## 2022-12-23 DIAGNOSIS — N400 Enlarged prostate without lower urinary tract symptoms: Secondary | ICD-10-CM | POA: Diagnosis not present

## 2022-12-23 DIAGNOSIS — I110 Hypertensive heart disease with heart failure: Secondary | ICD-10-CM | POA: Diagnosis not present

## 2022-12-23 DIAGNOSIS — H5789 Other specified disorders of eye and adnexa: Secondary | ICD-10-CM | POA: Diagnosis not present

## 2022-12-23 DIAGNOSIS — D6481 Anemia due to antineoplastic chemotherapy: Secondary | ICD-10-CM | POA: Diagnosis not present

## 2022-12-23 DIAGNOSIS — Z79899 Other long term (current) drug therapy: Secondary | ICD-10-CM | POA: Diagnosis not present

## 2022-12-23 DIAGNOSIS — E782 Mixed hyperlipidemia: Secondary | ICD-10-CM | POA: Diagnosis not present

## 2022-12-23 DIAGNOSIS — Z515 Encounter for palliative care: Secondary | ICD-10-CM | POA: Diagnosis not present

## 2022-12-23 DIAGNOSIS — L03116 Cellulitis of left lower limb: Secondary | ICD-10-CM | POA: Diagnosis not present

## 2022-12-23 DIAGNOSIS — M6281 Muscle weakness (generalized): Secondary | ICD-10-CM | POA: Diagnosis not present

## 2022-12-23 DIAGNOSIS — F03918 Unspecified dementia, unspecified severity, with other behavioral disturbance: Secondary | ICD-10-CM | POA: Diagnosis not present

## 2022-12-23 DIAGNOSIS — D649 Anemia, unspecified: Secondary | ICD-10-CM | POA: Diagnosis not present

## 2022-12-23 DIAGNOSIS — H01119 Allergic dermatitis of unspecified eye, unspecified eyelid: Secondary | ICD-10-CM | POA: Diagnosis not present

## 2022-12-23 DIAGNOSIS — D696 Thrombocytopenia, unspecified: Secondary | ICD-10-CM | POA: Diagnosis not present

## 2022-12-23 DIAGNOSIS — D638 Anemia in other chronic diseases classified elsewhere: Secondary | ICD-10-CM | POA: Diagnosis not present

## 2022-12-23 DIAGNOSIS — D630 Anemia in neoplastic disease: Secondary | ICD-10-CM | POA: Diagnosis not present

## 2022-12-23 DIAGNOSIS — G894 Chronic pain syndrome: Secondary | ICD-10-CM | POA: Diagnosis not present

## 2022-12-23 DIAGNOSIS — E871 Hypo-osmolality and hyponatremia: Secondary | ICD-10-CM | POA: Diagnosis not present

## 2022-12-23 DIAGNOSIS — G9341 Metabolic encephalopathy: Secondary | ICD-10-CM | POA: Diagnosis not present

## 2022-12-23 DIAGNOSIS — R7881 Bacteremia: Secondary | ICD-10-CM | POA: Diagnosis not present

## 2022-12-23 DIAGNOSIS — Z5112 Encounter for antineoplastic immunotherapy: Secondary | ICD-10-CM | POA: Diagnosis not present

## 2022-12-23 DIAGNOSIS — F4322 Adjustment disorder with anxiety: Secondary | ICD-10-CM | POA: Diagnosis not present

## 2022-12-23 DIAGNOSIS — R278 Other lack of coordination: Secondary | ICD-10-CM | POA: Diagnosis not present

## 2022-12-23 DIAGNOSIS — J45909 Unspecified asthma, uncomplicated: Secondary | ICD-10-CM | POA: Diagnosis not present

## 2022-12-23 DIAGNOSIS — Z79620 Long term (current) use of immunosuppressive biologic: Secondary | ICD-10-CM | POA: Diagnosis not present

## 2022-12-23 DIAGNOSIS — E785 Hyperlipidemia, unspecified: Secondary | ICD-10-CM | POA: Diagnosis not present

## 2022-12-23 DIAGNOSIS — R059 Cough, unspecified: Secondary | ICD-10-CM | POA: Diagnosis not present

## 2022-12-23 DIAGNOSIS — R1310 Dysphagia, unspecified: Secondary | ICD-10-CM | POA: Diagnosis not present

## 2022-12-23 DIAGNOSIS — Z471 Aftercare following joint replacement surgery: Secondary | ICD-10-CM | POA: Diagnosis not present

## 2022-12-23 DIAGNOSIS — R300 Dysuria: Secondary | ICD-10-CM | POA: Diagnosis not present

## 2022-12-23 DIAGNOSIS — Z7982 Long term (current) use of aspirin: Secondary | ICD-10-CM | POA: Diagnosis not present

## 2022-12-23 DIAGNOSIS — I10 Essential (primary) hypertension: Secondary | ICD-10-CM | POA: Diagnosis not present

## 2022-12-23 DIAGNOSIS — Z741 Need for assistance with personal care: Secondary | ICD-10-CM | POA: Diagnosis not present

## 2022-12-23 DIAGNOSIS — I509 Heart failure, unspecified: Secondary | ICD-10-CM | POA: Diagnosis not present

## 2022-12-23 DIAGNOSIS — Z1330 Encounter for screening examination for mental health and behavioral disorders, unspecified: Secondary | ICD-10-CM | POA: Diagnosis not present

## 2022-12-23 DIAGNOSIS — G4709 Other insomnia: Secondary | ICD-10-CM | POA: Diagnosis not present

## 2022-12-23 DIAGNOSIS — D539 Nutritional anemia, unspecified: Secondary | ICD-10-CM | POA: Diagnosis not present

## 2022-12-23 DIAGNOSIS — I2510 Atherosclerotic heart disease of native coronary artery without angina pectoris: Secondary | ICD-10-CM | POA: Diagnosis not present

## 2022-12-23 DIAGNOSIS — L509 Urticaria, unspecified: Secondary | ICD-10-CM | POA: Diagnosis not present

## 2022-12-23 DIAGNOSIS — H3530 Unspecified macular degeneration: Secondary | ICD-10-CM | POA: Diagnosis not present

## 2022-12-23 DIAGNOSIS — E7800 Pure hypercholesterolemia, unspecified: Secondary | ICD-10-CM | POA: Diagnosis not present

## 2022-12-23 DIAGNOSIS — F4323 Adjustment disorder with mixed anxiety and depressed mood: Secondary | ICD-10-CM | POA: Diagnosis not present

## 2022-12-23 DIAGNOSIS — C9000 Multiple myeloma not having achieved remission: Secondary | ICD-10-CM | POA: Diagnosis not present

## 2022-12-23 DIAGNOSIS — E039 Hypothyroidism, unspecified: Secondary | ICD-10-CM | POA: Diagnosis not present

## 2022-12-23 DIAGNOSIS — N451 Epididymitis: Secondary | ICD-10-CM | POA: Diagnosis not present

## 2022-12-23 MED ORDER — LOSARTAN POTASSIUM 50 MG PO TABS: 50.0000 mg | ORAL_TABLET | Freq: Every day | ORAL | Status: DC

## 2022-12-23 MED ORDER — METOPROLOL TARTRATE 25 MG PO TABS: 25.0000 mg | ORAL_TABLET | Freq: Two times a day (BID) | ORAL | Status: DC

## 2022-12-23 MED ORDER — SULFAMETHOXAZOLE-TRIMETHOPRIM 800-160 MG PO TABS: 1.0000 | ORAL_TABLET | Freq: Two times a day (BID) | ORAL | Status: AC

## 2022-12-23 MED ORDER — ISOSORBIDE MONONITRATE ER 60 MG PO TB24: 60.0000 mg | ORAL_TABLET | Freq: Two times a day (BID) | ORAL | Status: DC

## 2022-12-23 NOTE — TOC Transition Note (Signed)
Transition of Care [facility]) - CM/SW Discharge Note   Patient Details  Name: Dale [name] MRN: [id number] Date of Birth: 09/20/1928  Transition of Care [facility]) CM/SW Contact:  Elliot Gault, LCSW Phone Number: 12/23/2022, 11:44 AM   Clinical Narrative:     Pt medically stable for dc today per MD. Plan remains for dc to [facility] for rehab.  Debbie at [facility] states they can admit pt today. DC clinical sent electronically. RN to call report. EMS arranged.  No other TOC needs for dc.  Final next level of care: Skilled Nursing Facility Barriers to Discharge: Barriers Resolved   Patient Goals and CMS Choice CMS Medicare.gov Compare Post Acute Care list provided to:: Patient Represenative (must comment) Choice offered to / list presented to : Spouse  Discharge Placement                Patient chooses bed at: Other - please specify in the comment section below: [facility]) Patient to be transferred to facility by: EMS Name of family member notified: [name] Patient and family notified of of transfer: 12/23/22  Discharge Plan and Services Additional resources added to the After Visit Summary for   In-house Referral: Clinical Social Work   Post Acute Care Choice: Skilled Nursing Facility                               Social Determinants of Health (SDOH) Interventions SDOH Screenings   Food Insecurity: No Food Insecurity (12/15/2022)  Housing: Low Risk  (12/15/2022)  Transportation Needs: No Transportation Needs (12/15/2022)  Utilities: Not At Risk (12/15/2022)  Financial Resource Strain: Low Risk  (12/02/2022)  Physical Activity: Inactive (11/25/2022)  Social Connections: Unknown (09/18/2021)   Received from [facility]  Stress: No Stress Concern Present (10/16/2021)   Received from [facility]  Tobacco Use: Medium Risk (12/15/2022)     Readmission Risk Interventions    11/18/2022    3:29 PM 07/30/2021   10:16 AM  Readmission Risk  Prevention Plan  Transportation Screening Complete Complete  PCP or Specialist Appt within 3-5 Days Not Complete   HRI or Home Care Consult Complete Complete  Social Work Consult for Recovery Care Planning/Counseling Complete Complete  Palliative Care Screening Not Applicable Not Applicable  Medication Review Oceanographer) Complete Complete

## 2022-12-23 NOTE — Plan of Care (Signed)
  Problem: Education: Goal: Knowledge of General Education information will improve Description: Including pain rating scale, medication(s)/side effects and non-pharmacologic comfort measures Outcome: Adequate for Discharge   Problem: Health Behavior/Discharge Planning: Goal: Ability to manage health-related needs will improve Outcome: Adequate for Discharge   Problem: Clinical Measurements: Goal: Ability to maintain clinical measurements within normal limits will improve Outcome: Adequate for Discharge Goal: Will remain free from infection Outcome: Adequate for Discharge Goal: Diagnostic test results will improve Outcome: Adequate for Discharge Goal: Respiratory complications will improve Outcome: Adequate for Discharge Goal: Cardiovascular complication will be avoided Outcome: Adequate for Discharge   Problem: Activity: Goal: Risk for activity intolerance will decrease Outcome: Adequate for Discharge   Problem: Nutrition: Goal: Adequate nutrition will be maintained Outcome: Adequate for Discharge   Problem: Coping: Goal: Level of anxiety will decrease Outcome: Adequate for Discharge   Problem: Elimination: Goal: Will not experience complications related to bowel motility Outcome: Adequate for Discharge Goal: Will not experience complications related to urinary retention Outcome: Adequate for Discharge   Problem: Pain Managment: Goal: General experience of comfort will improve Outcome: Adequate for Discharge   Problem: Safety: Goal: Ability to remain free from injury will improve Outcome: Adequate for Discharge   Problem: Skin Integrity: Goal: Risk for impaired skin integrity will decrease Outcome: Adequate for Discharge   Problem: Acute Rehab PT Goals(only PT should resolve) Goal: Pt Will Go Supine/Side To Sit Outcome: Adequate for Discharge Goal: Patient Will Transfer Sit To/From Stand Outcome: Adequate for Discharge Goal: Pt Will Transfer Bed To Chair/Chair  To Bed Outcome: Adequate for Discharge Goal: Pt Will Ambulate Outcome: Adequate for Discharge   

## 2022-12-23 NOTE — Care Management Important Message (Deleted)
Important Message  Patient Details  Name: Dale [name] MRN: [id number] Date of Birth: 1929-02-28   Medicare Important Message Given:  Yes     Corey Harold 12/23/2022, 9:26 AM

## 2022-12-23 NOTE — Discharge Summary (Signed)
Physician Discharge Summary   Dale [name]:[id number] DOB: Jun 18, 1928 DOA: 12/15/2022  PCP: Dale Nevins, MD  Admit date: 12/15/2022 Discharge date: 12/23/2022  Admitted From: Home Disposition:  SNF Discharging physician: Dale Chamber, MD Barriers to discharge: none  Recommendations at discharge: Follow up with oncology regarding further MM treatment vs transition to possible hospice  Discharge Condition: stable CODE STATUS: DNR Diet recommendation:  Diet Orders (From admission, onward)     Start     Ordered   12/23/22 0000  Diet general        12/23/22 0914   12/20/22 1038  Diet regular Room service appropriate? Yes; Fluid consistency: Thin  Diet effective now       Question Answer Comment  Room service appropriate? Yes   Fluid consistency: Thin      12/20/22 1037            Hospital Course: Dale [name] is a 89 y.o. male with a history of multiple myeloma undergoing chemotherapy, ITP, CAD, HTN, HLD, hyponatremia, hypothyroidism, BPH who presented to the ED on 12/15/2022 with confusion found to have suspected cellulitis of the lower left leg/ankle.  Blood cultures have grown GNRs in aerobic bottles. MRI showed no osteomyelitis or abscess at the ankle, and cellulitis appears to be improving.  Blood cultures speciated out to Achromobacter dentrificans.  Case was discussed with infectious disease and he was recommended for having his port removed which took place on 12/20/2022 with general surgery.  Oncology was also made aware of recommendations. Repeat blood cultures were obtained after port removal and remained negative.   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  Both growing same organism as noted above - given atypical organism, have asked for ID input as well; appreciate assistance - recommendation is for port removal given high risk for seeding and/or biofilm.  Port removed on 12/20/2022 with general surgery - blood cultures repeated 8/16 and remain negative x 3 days at discharge -Meropenem transitioned to Bactrim per ID on 12/20/2022.  Plan is for total of 10-day course after port removal   Acute metabolic encephalopathy With acute and more subacute components, possible neurodegenerative disease/dementia which puts him at higher risk of delirium with infections, etc. CT head with cerebral volume loss and small vessel ischemic changes without acute findings. - Delirium precautions - Treat infectious etiology as above and monitor. - some delirium intermittently  - may need scheduled seroquel if continues   Left lower lobe pulmonary opacity (4.6 x 1.3 x 2.6cm) - This appears smaller than opacity November 17, 2022 which was treated with antibiotics. Would recommend repeat CT chest in 3 months (vs. PET vs. tissue sampling [which would be high risk w/thrombocytopenia])   Hyponatremia: Chronic. ?SIADH w/pulmonary opacity - Fluid restriction.    Pancytopenia, macrocytic anemia, thrombocytopenia: Has history of ITP - Monitor daily. 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    Multiple myeloma: Undergoing chemotherapy, last infusion 8/8 - Follows up with Dale [name] - Continue prophylactic acyclovir unless renal function worsens  - port removed  12/20/22 for above  - could consider no further treatments or port replacement given advanced age and functional status; defer to oncology follow up    CAD  - Continue long acting nitrate, BB, ASA, statin   HTN - Continue modified home  regimen   Hypothyroidism - Continue synthroid  -TSH mildly elevated but free T4 normal   HLD - Continue statin   DNR: POA   BPH: Not retaining on PVR     Principal Diagnosis: Cellulitis of left ankle  Discharge Diagnoses: Active Hospital Problems   Diagnosis Date Noted   Multiple myeloma (HCC) 01/15/2021    Priority: 4.   Anemia of chronic disease 05/21/2017    Priority: 4.   Chronic hyponatremia - baseline 125-134 07/29/2021    Priority: 6.   Thrombocytopenia (HCC) 02/02/2012   CAD, RCA stent, RCA new DES July 2009, Cath [place] 2011, 02/02/12 02/02/2012   BPH (benign prostatic hyperplasia) 02/02/2012   Essential hypertension 01/18/2009   HYPERCHOLESTEROLEMIA 01/18/2009   Acquired hypothyroidism 01/18/2009    Resolved Hospital Problems   Diagnosis Date Noted Date Resolved   Cellulitis of left ankle 12/15/2022 12/23/2022    Priority: 1.   Gram-negative bacteremia 12/16/2022 12/23/2022    Priority: 1.   Acute metabolic encephalopathy 07/29/2021 12/23/2022    Priority: 2.     Discharge Instructions     Diet general   Complete by: As directed    Increase activity slowly   Complete by: As directed       Allergies as of 12/23/2022       Reactions   Azithromycin Other (See Comments)   Sore mouth and fever blisters around mouth, sores in nose area as well   Doxazosin Shortness Of Breath   Atenolol Other (See Comments)   UNKNOWN REACTION   Hydrocodone Nausea And Vomiting   Levofloxacin Other (See Comments)   Caused stomach problems.   Morphine Other (See Comments)   "made me crazy"   Penicillins Nausea And Vomiting, Other (See Comments)   Has patient had a PCN reaction causing immediate rash, facial/tongue/throat swelling, SOB or lightheadedness with hypotension: No Has patient had a PCN reaction causing severe rash involving mucus membranes or skin necrosis: No Has patient had a PCN reaction that required hospitalization No Has patient had a PCN reaction  occurring within the last 10 years: No If all of the above answers are "NO", then may proceed with Cephalosporin use.   Sulfonamide Derivatives Nausea And Vomiting        Medication List     STOP taking these medications    dexamethasone 2 MG tablet Commonly known as: DECADRON   furosemide 20 MG tablet Commonly known as: LASIX   potassium chloride SA 20 MEQ tablet Commonly known as: KLOR-CON M       TAKE these medications    acetaminophen 325 MG tablet Commonly known as: TYLENOL Take 2 tablets (650 mg total) by mouth every 6 (six) hours as needed for mild pain (or Fever >/= 101).   acyclovir 400 MG tablet Commonly known as: ZOVIRAX TAKE 1 TABLET BY MOUTH TWICE  DAILY   aspirin EC 81 MG tablet Take 1 tablet (81 mg total) by mouth daily with breakfast. For 30 days Only What changed: additional instructions   atorvastatin 40 MG tablet Commonly known as: LIPITOR TAKE 1 TABLET BY MOUTH IN THE  EVENING   brimonidine 0.2 % ophthalmic solution Commonly known as: ALPHAGAN Place 1 drop into the left eye 2 (two)  times daily.   cyanocobalamin 1000 MCG tablet Take 1 tablet (1,000 mcg total) by mouth daily.   feeding supplement Liqd Take 237 mLs by mouth 3 (three) times daily between meals.   isosorbide mononitrate 60 MG 24 hr tablet Commonly known as: IMDUR Take 1 tablet (60 mg total) by mouth in the morning and at bedtime. What changed: See the new instructions.   levothyroxine 75 MCG tablet Commonly known as: SYNTHROID Take 75 mcg by mouth daily before breakfast.   lidocaine-prilocaine cream Commonly known as: EMLA Apply 1 Application topically as needed.   losartan 50 MG tablet Commonly known as: COZAAR Take 1 tablet (50 mg total) by mouth daily. What changed:  medication strength how much to take   metoprolol tartrate 25 MG tablet Commonly known as: LOPRESSOR Take 1 tablet (25 mg total) by mouth 2 (two) times daily. TAKE 1 TABLET BY MOUTH IN  THE  MORNING AND ONE-HALF  TABLET BY MOUTH IN THE  EVENING What changed: See the new instructions.   nitroGLYCERIN 0.4 MG SL tablet Commonly known as: NITROSTAT Place 1 tablet (0.4 mg total) under the tongue every 5 (five) minutes as needed. For chest pain   pantoprazole 40 MG tablet Commonly known as: PROTONIX TAKE 1 TABLET BY MOUTH DAILY  BEFORE BREAKFAST What changed: when to take this   PreserVision/Lutein Caps Take 1 capsule by mouth 2 (two) times daily.   prochlorperazine 10 MG tablet Commonly known as: COMPAZINE Take 1 tablet (10 mg total) by mouth every 6 (six) hours as needed for nausea or vomiting.   Rocklatan 0.02-0.005 % Soln Generic drug: Netarsudil-Latanoprost Place 1 drop into the left eye at bedtime.   sulfamethoxazole-trimethoprim 800-160 MG tablet Commonly known as: BACTRIM DS Take 1 tablet by mouth every 12 (twelve) hours for 7 days.   triamcinolone cream 0.1 % Commonly known as: KENALOG Apply 1 application. topically 2 (two) times daily as needed (for irritation).   VELCADE IJ Inject as directed once a week.   VITAMIN C PO Take 500 mg by mouth every evening.   vitamin E 200 UNIT capsule Take 200 Units by mouth every evening.        Contact information for after-discharge care     Destination     [facility] .   Service: Skilled Nursing Contact information: [address] [place] [address] [phone]                    Allergies  Allergen Reactions   Azithromycin Other (See Comments)    Sore mouth and fever blisters around mouth, sores in nose area as well   Doxazosin Shortness Of Breath   Atenolol Other (See Comments)    UNKNOWN REACTION   Hydrocodone Nausea And Vomiting   Levofloxacin Other (See Comments)    Caused stomach problems.   Morphine Other (See Comments)    "made me crazy"   Penicillins Nausea And Vomiting and Other (See Comments)    Has patient had a  PCN reaction causing immediate rash, facial/tongue/throat swelling, SOB or lightheadedness with hypotension: No Has patient had a PCN reaction causing severe rash involving mucus membranes or skin necrosis: No Has patient had a PCN reaction that required hospitalization No Has patient had a PCN reaction occurring within the last 10 years: No If all of the above answers are "NO", then may proceed with Cephalosporin use.    Sulfonamide Derivatives Nausea And Vomiting  Consultations: ID General surgery  Procedures: 8/16: Port removal   Discharge Exam: BP (!) 161/76 (BP Location: Right Arm)   Pulse 76   Temp 97.7 F (36.5 C)   Resp 20   Ht 5\' 11"  (1.803 m)   Wt 53.3 kg   SpO2 100%   BMI 16.39 kg/m  Physical Exam Constitutional:      General: He is not in acute distress.    Appearance: Normal appearance.  HENT:     Head: Normocephalic and atraumatic.     Mouth/Throat:     Mouth: Mucous membranes are moist.  Eyes:     Extraocular Movements: Extraocular movements intact.  Cardiovascular:     Rate and Rhythm: Normal rate and regular rhythm.  Pulmonary:     Effort: Pulmonary effort is normal. No respiratory distress.     Breath sounds: Normal breath sounds. No wheezing.  Abdominal:     General: Bowel sounds are normal. There is no distension.     Palpations: Abdomen is soft.     Tenderness: There is no abdominal tenderness.  Musculoskeletal:     Cervical back: Normal range of motion and neck supple.     Comments: Left foot/ankle noted with resolved edema and calor. No further erythema.  Mild decreased ROM due to pain but overall improved too  Skin:    General: Skin is warm and dry.  Neurological:     General: No focal deficit present.     Mental Status: He is alert.  Psychiatric:        Mood and Affect: Mood normal.        Behavior: Behavior normal.      The results of significant diagnostics from this hospitalization (including imaging, microbiology, ancillary  and laboratory) are listed below for reference.   Microbiology: Recent Results (from the past 240 hour(s))  Culture, blood (routine x 2)     Status: Abnormal   Collection Time: 12/15/22 12:27 PM   Specimen: BLOOD RIGHT ARM  Result Value Ref Range Status   Specimen Description   Final    BLOOD RIGHT ARM BOTTLES DRAWN AEROBIC AND ANAEROBIC Performed at [facility], [address] [place], [place] [address]    Special Requests   Final    Blood Culture adequate volume Performed at [facility], [address] [place], [place] [address]    Culture  Setup Time   Final    GRAM NEGATIVE RODS AEROBIC BOTTLE ONLY Gram Stain Report Called to,Read Back By and Verified With: C BLACKWELL AT 1331 ON [id number] BY S DALTON CRITICAL RESULT CALLED TO, READ BACK BY AND VERIFIED WITH: RN DEANNA SMITH ON 12/16/22 @ 1705 BY DRT    Culture (A)  Final    ACHROMOBACTER DENITRIFICANS SUSCEPTIBILITIES PERFORMED ON PREVIOUS CULTURE WITHIN THE LAST 5 DAYS. Performed at [facility], [address], [place], [place] [address]    Report Status 12/20/2022 FINAL  Final  Culture, blood (routine x 2)     Status: Abnormal   Collection Time: 12/15/22 12:27 PM   Specimen: Left Antecubital; Blood  Result Value Ref Range Status   Specimen Description   Final    LEFT ANTECUBITAL BOTTLES DRAWN AEROBIC AND ANAEROBIC Performed at [facility], [address], [place], [place] [address]    Special Requests   Final    Blood Culture adequate volume Performed at [facility], [address], [place], [place] [address]    Culture  Setup Time   Final  GRAM NEGATIVE RODS AEROBIC BOTTLE ONLY Gram Stain Report Called to,Read Back By and Verified With: B SCHWARTZ AT 1309 ON [id number] BY S DALTON Performed at [facility], [address], [place], [place] [address]    Culture ACHROMOBACTER DENITRIFICANS (A)  Final   Report Status 12/19/2022 FINAL  Final   Organism ID, Bacteria ACHROMOBACTER DENITRIFICANS  Final       Susceptibility   Achromobacter denitrificans - MIC*    GENTAMICIN 8 INTERMEDIATE Intermediate     CIPROFLOXACIN 2 INTERMEDIATE Intermediate     IMIPENEM 1 SENSITIVE Sensitive     TRIMETH/SULFA <=20 SENSITIVE Sensitive     * ACHROMOBACTER DENITRIFICANS  Blood Culture ID Panel (Reflexed)     Status: None   Collection Time: 12/15/22 12:27 PM  Result Value Ref Range Status   Enterococcus faecalis NOT DETECTED NOT DETECTED Final   Enterococcus Faecium NOT DETECTED NOT DETECTED Final   Listeria monocytogenes NOT DETECTED NOT DETECTED Final   Staphylococcus species NOT DETECTED NOT DETECTED Final   Staphylococcus aureus (BCID) NOT DETECTED NOT DETECTED Final   Staphylococcus epidermidis NOT DETECTED NOT DETECTED Final   Staphylococcus lugdunensis NOT DETECTED NOT DETECTED Final   Streptococcus species NOT DETECTED NOT DETECTED Final   Streptococcus agalactiae NOT DETECTED NOT DETECTED Final   Streptococcus pneumoniae NOT DETECTED NOT DETECTED Final   Streptococcus pyogenes NOT DETECTED NOT DETECTED Final   A.calcoaceticus-baumannii NOT DETECTED NOT DETECTED Final   Bacteroides fragilis NOT DETECTED NOT DETECTED Final   Enterobacterales NOT DETECTED NOT DETECTED Final   Enterobacter cloacae complex NOT DETECTED NOT DETECTED Final   Escherichia coli NOT DETECTED NOT DETECTED Final   Klebsiella aerogenes NOT DETECTED NOT DETECTED Final   Klebsiella oxytoca NOT DETECTED NOT DETECTED Final   Klebsiella pneumoniae NOT DETECTED NOT DETECTED Final   Proteus species NOT DETECTED NOT DETECTED Final   Salmonella species NOT DETECTED NOT DETECTED Final   Serratia marcescens NOT DETECTED NOT DETECTED Final   Haemophilus influenzae NOT DETECTED NOT DETECTED Final   Neisseria meningitidis NOT DETECTED NOT DETECTED Final   Pseudomonas aeruginosa NOT DETECTED NOT DETECTED Final   Stenotrophomonas maltophilia NOT DETECTED NOT DETECTED Final   Candida albicans NOT DETECTED NOT DETECTED Final   Candida  auris NOT DETECTED NOT DETECTED Final   Candida glabrata NOT DETECTED NOT DETECTED Final   Candida krusei NOT DETECTED NOT DETECTED Final   Candida parapsilosis NOT DETECTED NOT DETECTED Final   Candida tropicalis NOT DETECTED NOT DETECTED Final   Cryptococcus neoformans/gattii NOT DETECTED NOT DETECTED Final    Comment: Performed at [facility], [address], [place], [place] [address]  Culture, blood (Routine X 2) w Reflex to ID Panel     Status: None (Preliminary result)   Collection Time: 12/20/22 11:09 AM   Specimen: BLOOD  Result Value Ref Range Status   Specimen Description BLOOD LEFT ANTECUBITAL  Final   Special Requests   Final    BOTTLES DRAWN AEROBIC AND ANAEROBIC Blood Culture results may not be optimal due to an excessive volume of blood received in culture bottles   Culture   Final    NO GROWTH 3 DAYS Performed at [facility], [address], [place], [place] [address]    Report Status PENDING  Incomplete  Culture, blood (Routine X 2) w Reflex to ID Panel     Status: None (Preliminary result)   Collection Time: 12/20/22 11:09 AM   Specimen: BLOOD  Result Value Ref Range  Status   Specimen Description BLOOD BLOOD RIGHT HAND  Final   Special Requests   Final    BOTTLES DRAWN AEROBIC AND ANAEROBIC Blood Culture adequate volume   Culture   Final    NO GROWTH 3 DAYS Performed at [facility], [address] [place], [place] [address]    Report Status PENDING  Incomplete     Labs: BNP (last 3 results) No results for input(s): "BNP" in the last 8760 hours. Basic Metabolic Panel: Recent Labs  Lab 12/17/22 0512 12/18/22 0412 12/20/22 0414 12/21/22 0425  NA 128* 130* 128* 128*  K 4.0 3.3* 3.4* 3.0*  CL 100 102 100 98  CO2 23 24 24 25   GLUCOSE 109* 101* 94 94  BUN 16 18 11 11   CREATININE 0.89 0.90 0.70 0.70  CALCIUM 8.5* 8.3* 8.2* 8.3*  MG  --   --  2.0 2.0   Liver Function Tests: No results for input(s): "AST", "ALT", "ALKPHOS", "BILITOT", "PROT",  "ALBUMIN" in the last 168 hours. No results for input(s): "LIPASE", "AMYLASE" in the last 168 hours. No results for input(s): "AMMONIA" in the last 168 hours. CBC: Recent Labs  Lab 12/17/22 0512 12/18/22 0412  WBC 9.1 4.9  HGB 9.6* 8.7*  HCT 29.4* 26.7*  MCV 112.2* 112.2*  PLT 73* 78*   Cardiac Enzymes: No results for input(s): "CKTOTAL", "CKMB", "CKMBINDEX", "TROPONINI" in the last 168 hours. BNP: Invalid input(s): "POCBNP" CBG: No results for input(s): "GLUCAP" in the last 168 hours. D-Dimer No results for input(s): "DDIMER" in the last 72 hours. Hgb A1c No results for input(s): "HGBA1C" in the last 72 hours. Lipid Profile No results for input(s): "CHOL", "HDL", "LDLCALC", "TRIG", "CHOLHDL", "LDLDIRECT" in the last 72 hours. Thyroid function studies No results for input(s): "TSH", "T4TOTAL", "T3FREE", "THYROIDAB" in the last 72 hours.  Invalid input(s): "FREET3" Anemia work up No results for input(s): "VITAMINB12", "FOLATE", "FERRITIN", "TIBC", "IRON", "RETICCTPCT" in the last 72 hours. Urinalysis    Component Value Date/Time   COLORURINE YELLOW 12/16/2022 1305   APPEARANCEUR CLEAR 12/16/2022 1305   APPEARANCEUR Clear 08/21/2021 1415   LABSPEC 1.021 12/16/2022 1305   PHURINE 6.0 12/16/2022 1305   GLUCOSEU NEGATIVE 12/16/2022 1305   HGBUR SMALL (A) 12/16/2022 1305   BILIRUBINUR NEGATIVE 12/16/2022 1305   BILIRUBINUR Negative 08/21/2021 1415   KETONESUR NEGATIVE 12/16/2022 1305   PROTEINUR 100 (A) 12/16/2022 1305   UROBILINOGEN 1.0 02/02/2012 0630   NITRITE NEGATIVE 12/16/2022 1305   LEUKOCYTESUR NEGATIVE 12/16/2022 1305   Sepsis Labs Recent Labs  Lab 12/17/22 0512 12/18/22 0412  WBC 9.1 4.9   Microbiology Recent Results (from the past 240 hour(s))  Culture, blood (routine x 2)     Status: Abnormal   Collection Time: 12/15/22 12:27 PM   Specimen: BLOOD RIGHT ARM  Result Value Ref Range Status   Specimen Description   Final    BLOOD RIGHT ARM BOTTLES  DRAWN AEROBIC AND ANAEROBIC Performed at [facility], [address] [place], [place] [address]    Special Requests   Final    Blood Culture adequate volume Performed at [facility], [address] [place], [place] [address]    Culture  Setup Time   Final    GRAM NEGATIVE RODS AEROBIC BOTTLE ONLY Gram Stain Report Called to,Read Back By and Verified With: C BLACKWELL AT 1331 ON [id number] BY S DALTON CRITICAL RESULT CALLED TO, READ BACK BY AND VERIFIED WITH: RN [facility] SMITH ON 12/16/22 @ 1705 BY DRT  Culture (A)  Final    ACHROMOBACTER DENITRIFICANS SUSCEPTIBILITIES PERFORMED ON PREVIOUS CULTURE WITHIN THE LAST 5 DAYS. Performed at [facility], [address], [place], [place] [address]    Report Status 12/20/2022 FINAL  Final  Culture, blood (routine x 2)     Status: Abnormal   Collection Time: 12/15/22 12:27 PM   Specimen: Left Antecubital; Blood  Result Value Ref Range Status   Specimen Description   Final    LEFT ANTECUBITAL BOTTLES DRAWN AEROBIC AND ANAEROBIC Performed at [facility], [address], [place], [place] [address]    Special Requests   Final    Blood Culture adequate volume Performed at [facility], [address], [place], [place] [address]    Culture  Setup Time   Final    GRAM NEGATIVE RODS AEROBIC BOTTLE ONLY Gram Stain Report Called to,Read Back By and Verified With: B SCHWARTZ AT 1309 ON [id number] BY S DALTON Performed at [facility], [address] [place], [place] [address]    Culture ACHROMOBACTER DENITRIFICANS (A)  Final   Report Status 12/19/2022 FINAL  Final   Organism ID, Bacteria ACHROMOBACTER DENITRIFICANS  Final      Susceptibility   Achromobacter denitrificans - MIC*    GENTAMICIN 8 INTERMEDIATE Intermediate     CIPROFLOXACIN 2 INTERMEDIATE Intermediate     IMIPENEM 1 SENSITIVE Sensitive     TRIMETH/SULFA <=20 SENSITIVE Sensitive     * ACHROMOBACTER DENITRIFICANS  Blood Culture ID Panel (Reflexed)     Status: None    Collection Time: 12/15/22 12:27 PM  Result Value Ref Range Status   Enterococcus faecalis NOT DETECTED NOT DETECTED Final   Enterococcus Faecium NOT DETECTED NOT DETECTED Final   Listeria monocytogenes NOT DETECTED NOT DETECTED Final   Staphylococcus species NOT DETECTED NOT DETECTED Final   Staphylococcus aureus (BCID) NOT DETECTED NOT DETECTED Final   Staphylococcus epidermidis NOT DETECTED NOT DETECTED Final   Staphylococcus lugdunensis NOT DETECTED NOT DETECTED Final   Streptococcus species NOT DETECTED NOT DETECTED Final   Streptococcus agalactiae NOT DETECTED NOT DETECTED Final   Streptococcus pneumoniae NOT DETECTED NOT DETECTED Final   Streptococcus pyogenes NOT DETECTED NOT DETECTED Final   A.calcoaceticus-baumannii NOT DETECTED NOT DETECTED Final   Bacteroides fragilis NOT DETECTED NOT DETECTED Final   Enterobacterales NOT DETECTED NOT DETECTED Final   Enterobacter cloacae complex NOT DETECTED NOT DETECTED Final   Escherichia coli NOT DETECTED NOT DETECTED Final   Klebsiella aerogenes NOT DETECTED NOT DETECTED Final   Klebsiella oxytoca NOT DETECTED NOT DETECTED Final   Klebsiella pneumoniae NOT DETECTED NOT DETECTED Final   Proteus species NOT DETECTED NOT DETECTED Final   Salmonella species NOT DETECTED NOT DETECTED Final   Serratia marcescens NOT DETECTED NOT DETECTED Final   Haemophilus influenzae NOT DETECTED NOT DETECTED Final   Neisseria meningitidis NOT DETECTED NOT DETECTED Final   Pseudomonas aeruginosa NOT DETECTED NOT DETECTED Final   Stenotrophomonas maltophilia NOT DETECTED NOT DETECTED Final   Candida albicans NOT DETECTED NOT DETECTED Final   Candida auris NOT DETECTED NOT DETECTED Final   Candida glabrata NOT DETECTED NOT DETECTED Final   Candida krusei NOT DETECTED NOT DETECTED Final   Candida parapsilosis NOT DETECTED NOT DETECTED Final   Candida tropicalis NOT DETECTED NOT DETECTED Final   Cryptococcus neoformans/gattii NOT DETECTED NOT DETECTED Final     Comment: Performed at [facility], [address] [place], [place] [address]  Culture, blood (Routine X 2)  w Reflex to ID Panel     Status: None (Preliminary result)   Collection Time: 12/20/22 11:09 AM   Specimen: BLOOD  Result Value Ref Range Status   Specimen Description BLOOD LEFT ANTECUBITAL  Final   Special Requests   Final    BOTTLES DRAWN AEROBIC AND ANAEROBIC Blood Culture results may not be optimal due to an excessive volume of blood received in culture bottles   Culture   Final    NO GROWTH 3 DAYS Performed at [facility], [address], [place], [place] [address]    Report Status PENDING  Incomplete  Culture, blood (Routine X 2) w Reflex to ID Panel     Status: None (Preliminary result)   Collection Time: 12/20/22 11:09 AM   Specimen: BLOOD  Result Value Ref Range Status   Specimen Description BLOOD BLOOD RIGHT HAND  Final   Special Requests   Final    BOTTLES DRAWN AEROBIC AND ANAEROBIC Blood Culture adequate volume   Culture   Final    NO GROWTH 3 DAYS Performed at [facility], [address] [place], [place] [address]    Report Status PENDING  Incomplete    Procedures/Studies: MR ANKLE LEFT W WO CONTRAST  Result Date: 12/17/2022 CLINICAL DATA:  Ankle pain and swelling with bacteremia. Evaluate for septic joint. Recent radiographs obtained for pain after falling. EXAM: MRI OF THE LEFT ANKLE WITHOUT AND WITH CONTRAST TECHNIQUE: Multiplanar, multisequence MR imaging of the ankle was performed before and after the administration of intravenous contrast. CONTRAST:  5mL GADAVIST GADOBUTROL 1 MMOL/ML IV SOLN COMPARISON:  Radiographs of the ankle and lower leg 12/15/2022. FINDINGS: TENDONS Peroneal: Mild longitudinal split tearing of the peroneus brevis tendon. The peroneal tendons are normally located, and there is no significant tenosynovitis or abnormal synovial enhancement. Posteromedial: Intact and normally positioned. Anterior: Intact and normally positioned.  Achilles: Intact. Plantar Fascia: Intact. LIGAMENTS Lateral: The anterior and posterior talofibular and calcaneofibular ligaments are intact.The inferior tibiofibular ligaments appear intact. Medial: The deltoid and visualized portions of the spring ligament appear intact. CARTILAGE AND BONES Ankle Joint: No significant ankle joint effusion or abnormal synovial enhancement. The talar dome and tibial plafond appear normal. No subchondral edema. Subtalar Joints/Sinus Tarsi: No subtalar joint effusion, abnormal enhancement or subchondral edema. The tarsal sinus appears normal. Bones: No evidence of acute fracture, dislocation or osteomyelitis. No significant hindfoot or midfoot arthropathy. Other: Nonspecific generalized subcutaneous edema surrounding the ankle and extending into the dorsal aspect of the midfoot. No focal fluid collection or abnormal enhancement identified. No evidence of foreign body. IMPRESSION: IMPRESSION 1. Nonspecific generalized subcutaneous edema surrounding the ankle and extending into the dorsal aspect of the midfoot. No focal fluid collection or abnormal enhancement identified. 2. No evidence of septic arthritis or osteomyelitis. 3. Longitudinal split tear of the peroneus brevis tendon. The additional ankle tendons and ligaments appear intact. Electronically Signed   By: Carey Bullocks M.D.   On: 12/17/2022 08:18   CT ABDOMEN PELVIS W CONTRAST  Result Date: 12/15/2022 CLINICAL DATA:  Abdominal pain, acute, nonlocalized EXAM: CT ABDOMEN AND PELVIS WITH CONTRAST TECHNIQUE: Multidetector CT imaging of the abdomen and pelvis was performed using the standard protocol following bolus administration of intravenous contrast. RADIATION DOSE REDUCTION: This exam was performed according to the departmental dose-optimization program which includes automated exposure control, adjustment of the mA and/or kV according to patient size and/or use of iterative reconstruction technique. CONTRAST:   OMNIPAQUE IOHEXOL 300 MG/ML  SOLN COMPARISON:  11/17/2022 FINDINGS: Lower chest: Mass-like opacity at the periphery of the left lower lobe measures approximately 4.6 x 1.3 x 2.6 cm, previously measured approximately 3.9 x 2.0 x 2.6 cm. Mild adjacent ground-glass opacity has slightly improved. Heart size is normal. Coronary artery atherosclerosis. Hepatobiliary: Unchanged appearance of the liver including prominent left hepatic lobe cyst. No new focal liver abnormality. Unremarkable gallbladder. No hyperdense gallstone. Similar mild biliary dilatation. Pancreas: Unremarkable. No pancreatic ductal dilatation or surrounding inflammatory changes. Spleen: Normal in size without focal abnormality. Adrenals/Urinary Tract: Unremarkable adrenal glands. Kidneys enhance symmetrically without focal lesion, stone, or hydronephrosis. Ureters are nondilated. There is a anterior urinary bladder wall diverticulum. Stomach/Bowel: Stomach within normal limits. No abnormally dilated loops of bowel. Mild wall thickening of the proximal to mid transverse colon. Extensive sigmoid diverticulosis. Moderate volume of stool within the colon. Vascular/Lymphatic: Aortic atherosclerosis. No enlarged abdominal or pelvic lymph nodes. Reproductive: Prostate is unremarkable. Other: No free fluid. No abdominopelvic fluid collection. No pneumoperitoneum. Small fat containing right inguinal hernia. Musculoskeletal: No acute or significant osseous findings. IMPRESSION: 1. Mild wall thickening of the proximal to mid transverse colon, which may be secondary to underdistention or mild colitis. 2. Extensive sigmoid diverticulosis without evidence of acute diverticulitis. 3. Mass-like opacity at the periphery of the left lower lobe measures approximately 4.6 x 1.3 x 2.6 cm, previously measured approximately 3.9 x 2.0 x 2.6 cm. Consider one of the following in 3 months for both low-risk and high-risk individuals: (a) repeat chest CT, (b) follow-up PET-CT, or  (c) tissue sampling. This recommendation follows the consensus statement: Guidelines for Management of Incidental Pulmonary Nodules Detected on CT Images: From the Fleischner Society 2017; Radiology 2017; 284:228-243. 4. Aortic and coronary artery atherosclerosis (ICD10-I70.0). Electronically Signed   By: Duanne Guess D.O.   On: 12/15/2022 15:40   CT Head Wo Contrast  Result Date: 12/15/2022 CLINICAL DATA:  Mental status change, unknown cause EXAM: CT HEAD WITHOUT CONTRAST TECHNIQUE: Contiguous axial images were obtained from the base of the skull through the vertex without intravenous contrast. RADIATION DOSE REDUCTION: This exam was performed according to the departmental dose-optimization program which includes automated exposure control, adjustment of the mA and/or kV according to patient size and/or use of iterative reconstruction technique. COMPARISON:  10/07/2022 FINDINGS: Brain: No evidence of acute infarction, hemorrhage, hydrocephalus, extra-axial collection or mass lesion/mass effect. Patchy low-density changes within the periventricular and subcortical white matter most compatible with chronic microvascular ischemic change. Mild diffuse cerebral volume loss. Vascular: Atherosclerotic calcifications involving the large vessels of the skull base. No unexpected hyperdense vessel. Skull: Normal. Negative for fracture or focal lesion. Sinuses/Orbits: Chronic left paranasal sinus mucosal thickening. Scattered opacification within the left ethmoid air cells. Other: None. IMPRESSION: 1. No acute intracranial findings. 2. Chronic microvascular ischemic change and cerebral volume loss. Electronically Signed   By: Duanne Guess D.O.   On: 12/15/2022 15:29   DG Ankle Complete Left  Result Date: 12/15/2022 CLINICAL DATA:  89 year old male with history of trauma from a fall complaining of pain. EXAM: LEFT ANKLE COMPLETE - 3+ VIEW COMPARISON:  None Available. FINDINGS: There is no evidence of fracture,  dislocation, or joint effusion. Multifocal joint space narrowing, subchondral sclerosis and osteophyte formation noted in the tibiotalar joint and throughout the visualized portions of the midfoot and hindfoot, indicative of osteoarthritis. Numerous vascular calcifications. Mild soft tissue swelling around the ankle, most evident overlying the lateral malleolus. IMPRESSION: 1. Mild soft tissue swelling, most evident overlying the lateral malleolus. No underlying  acute osseous abnormality. 2. Degenerative changes of osteoarthritis, as above. 3. Atherosclerosis. Electronically Signed   By: Trudie Reed M.D.   On: 12/15/2022 12:38   DG Chest 1 View  Result Date: 12/15/2022 CLINICAL DATA:  89 year old male with history of trauma from a fall. Chest pain. EXAM: CHEST  1 VIEW COMPARISON:  Chest x-ray 11/17/2022. FINDINGS: Left-sided single-lumen Port-A-Cath with tip terminating near the cavoatrial junction. Lung volumes are slightly low. Diffuse interstitial prominence and widespread peribronchial cuffing with an overall reticulonodular appearance to the lungs which is very similar to the prior study. No new acute consolidative airspace disease. No pleural effusions. No pneumothorax. No evidence of pulmonary edema. Heart size is normal. Upper mediastinal contours are within normal limits. IMPRESSION: 1. No acute findings in the thorax. 2. Chronic parenchymal changes in the lungs suggestive of chronic bronchitis and probable multifocal chronic postinfectious or inflammatory scarring, similar to prior studies, as above. 3. Aortic atherosclerosis. Electronically Signed   By: Trudie Reed M.D.   On: 12/15/2022 12:26   DG Tibia/Fibula Left  Result Date: 12/15/2022 CLINICAL DATA:  89 year old male with history of trauma from a fall. Left leg pain. EXAM: LEFT TIBIA AND FIBULA - 2 VIEW COMPARISON:  No priors. FINDINGS: Four views of the left leg demonstrate no acute displaced fracture of the tibia or fibula.  Numerous vascular calcifications are noted. IMPRESSION: 1. No acute radiographic abnormality of the left tibia or fibula. 2. Atherosclerosis. Electronically Signed   By: Trudie Reed M.D.   On: 12/15/2022 12:22   DG Ankle Complete Right  Result Date: 12/15/2022 CLINICAL DATA:  89 year old male with history of trauma from a fall complaining of left ankle pain. EXAM: RIGHT ANKLE - COMPLETE 3+ VIEW COMPARISON:  No priors. FINDINGS: There is no evidence of fracture, dislocation, or joint effusion. Degenerative changes of osteoarthritis are noted at the tibiotalar joint and throughout the visualized portions of the midfoot and hindfoot. Numerous vascular calcifications are noted. Soft tissues are otherwise unremarkable. IMPRESSION: 1. No acute radiographic abnormality of the right ankle. 2. Osteoarthritis, as above. 3. Atherosclerosis. Electronically Signed   By: Trudie Reed M.D.   On: 12/15/2022 12:22     Time coordinating discharge: Over 30 minutes    Dale Chamber, MD  [facility] 12/23/2022, 9:18 AM

## 2022-12-23 NOTE — Consult Note (Addendum)
   [facility] Inpatient Consult   12/23/2022  Dale [name] 1928/10/06 [id number]   Primary Care Provider:    Patient is currently active with Care Management for chronic disease management services.  Patient has been engaged by a Charity fundraiser.  Our community based plan of care has focused on disease management and community resource support.   Patient will receive a post hospital call and will be evaluated for assessments and disease process education.   Plan: Pt will discharged to [facility]   Inpatient Transition Of Care [TOC] team member to make aware that Care Management following.  Of note, Care Management services does not replace or interfere with any services that are needed or arranged by inpatient [facility] care management team.   For additional questions or referrals please contact:   Elliot Cousin, RN, [facility] Liaison [place]   Population Health Office Hours MTWF  8:00 am-6:00 pm Off on Thursday [phone] mobile [phone] [Office toll free line] Office Hours are M-F 8:30 - 5 pm [email or web address]

## 2022-12-23 NOTE — Patient Outreach (Signed)
  Care Coordination   12/23/2022 Name: Dale [name] MRN: [id number] DOB: 29-Jul-1928   Care Coordination Outreach Attempts:  An unsuccessful telephone outreach was attempted for a scheduled appointment today.  Follow Up Plan:  Additional outreach attempts will be made to offer the patient care coordination information and services.   Encounter Outcome:  No Answer   Care Coordination Interventions:  No, not indicated    Patient was admitted to [facility] on 12/15/22 and plans to discharge to [facility] today. Notified via staff message from [facility] Liaison  [place]   Population Health) that "PAC-RN does not follow pts at this facility. Please keep pt on your radar".   Staff message to care guide to schedule follow-up telephone call with wife in 1-2 weeks to assess SNF inpatient status and plan.   Demetrios Loll, BSN, RN-BC RN Care Coordinator [facility]  [facility] Direct Dial: [phone] Main #: [phone]

## 2022-12-25 DIAGNOSIS — I2510 Atherosclerotic heart disease of native coronary artery without angina pectoris: Secondary | ICD-10-CM | POA: Diagnosis not present

## 2022-12-25 DIAGNOSIS — E871 Hypo-osmolality and hyponatremia: Secondary | ICD-10-CM | POA: Diagnosis not present

## 2022-12-25 DIAGNOSIS — L03116 Cellulitis of left lower limb: Secondary | ICD-10-CM | POA: Diagnosis not present

## 2022-12-25 DIAGNOSIS — C9000 Multiple myeloma not having achieved remission: Secondary | ICD-10-CM | POA: Diagnosis not present

## 2022-12-25 DIAGNOSIS — G9341 Metabolic encephalopathy: Secondary | ICD-10-CM | POA: Diagnosis not present

## 2022-12-25 DIAGNOSIS — E039 Hypothyroidism, unspecified: Secondary | ICD-10-CM | POA: Diagnosis not present

## 2022-12-25 DIAGNOSIS — M6281 Muscle weakness (generalized): Secondary | ICD-10-CM | POA: Diagnosis not present

## 2022-12-25 DIAGNOSIS — I509 Heart failure, unspecified: Secondary | ICD-10-CM | POA: Diagnosis not present

## 2022-12-25 LAB — CULTURE, BLOOD (ROUTINE X 2)
Culture: NO GROWTH
Culture: NO GROWTH
Special Requests: ADEQUATE

## 2022-12-25 NOTE — Progress Notes (Signed)
[facility] [address], [place] [address]    Clinic Day:  12/26/22    Referring physician: Elfredia Nevins, MD  Patient Care Team: Elfredia Nevins, MD as PCP - General (Internal Medicine) Lennette Bihari, MD as PCP - Cardiology (Cardiology) Lennette Bihari, MD as Consulting Physician (Cardiology) Jena Gauss Gerrit Friends, MD as Consulting Physician (Gastroenterology) Doreatha Massed, MD as Medical Oncologist (Oncology) Gwenith Daily, RN as [facility] Care Management   ASSESSMENT & PLAN:   Assessment: 1.  IgG lambda plasma cell myeloma: - Work-up for macrocytic anemia on 12/01/2020 showed M spike of 2.9 g.  Immunofixation IgG lambda. - Lambda light chains elevated at 284.  Light chain ratio was 0.04.  LDH was 163.  Creatinine was 0.9 and calcium 8.9. - Bone marrow biopsy on 12/18/2020-hypercellular marrow for age with 48% atypical plasma cells with lambda light chain restriction. - Chromosome analysis was normal. 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 - Revlimid 5 mg, 2 weeks on/1 week off started on 01/20/2021.  Dexamethasone weekly 10 mg added on 02/07/2021.  Revlimid is discontinued around 06/15/2021 due to poor tolerance and ineffectiveness at low-dose.  Thrombocytopenia and anemia. - Velcade weekly on days 1, 8, 15 every 21 days along with dexamethasone 10 mg weekly started on 06/25/2021.  2.  Macrocytic anemia: - CBC on 12/01/2020 with hemoglobin 8.5 and MCV of 117. - Denies any bleeding per rectum or melena.   3.  Social/family history: - Lives at home with his wife.  He does all ADLs and IADLs.  He even does yard work. - He worked at [employer] for 35 years.  Denies any chemical exposure.  Non-smoker. - No family history of malignancies.     Plan: 1.  IgG lambda plasma cell myeloma, high risk: - He was recently hospitalized and found to have leg  cellulitis and gram-negative bacteremia.  Port was discontinued. - He is currently at [facility] rehab for the last 2 to 3 days.  He is getting physical therapy. - He is feeling weak since recent hospitalization. - Labs today: Creatinine elevated at 1.76.  CBC grossly normal.  Calcium is 9.2 with albumin 2.9. - Recommend 1 L of normal saline IV over 2 hours. - RTC 2 to 3 weeks with repeat labs and possible fluids.  2.  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  No indication for transfusion.  3.  Ankle swelling: - Use Lasix as needed.  4.  ID prophylaxis: - Continue acyclovir twice daily and aspirin 81 mg 3 times weekly.     Orders Placed This Encounter  Procedures   CBC with Differential    Standing Status:   Future    Standing Expiration Date:   12/26/2023   Comprehensive metabolic panel    Standing Status:   Future    Standing Expiration Date:   12/26/2023   Magnesium    Standing Status:   Future    Standing Expiration Date:   12/26/2023   Sample to Blood Bank(Blood Bank Hold)    Standing Status:   Future    Number of Occurrences:   1    Standing Expiration Date:   12/26/2023       Alben Deeds Teague,acting as a scribe for Doreatha Massed, MD.,have documented all relevant documentation on the behalf of Doreatha Massed, MD,as directed by  Doreatha Massed, MD while in the presence of Doreatha Massed, MD.  I, Doreatha Massed MD, have reviewed the above documentation for accuracy and completeness, and I agree with the above.     Doreatha Massed, MD   8/22/20245:21 PM  CHIEF COMPLAINT:   Diagnosis: multiple myeloma and anemia    Cancer Staging  No matching staging information was found for the patient.    Prior Therapy: none  Current Therapy:  Weekly Velcade and daratumumab    HISTORY OF PRESENT ILLNESS:   Oncology History  Multiple myeloma (HCC)  01/15/2021 Initial Diagnosis   Multiple myeloma  (HCC)   06/25/2021 - 12/06/2021 Chemotherapy   Patient is on Treatment Plan : MYELOMA NON-TRANSPLANT CANDIDATES VRd weekly q21d     12/20/2021 -  Chemotherapy   Patient is on Treatment Plan : MYELOMA Daratumumab IV q28d        INTERVAL HISTORY:   Semisi is a 89 y.o. male presenting to clinic today for follow up of multiple myeloma and anemia. He was last seen by me on 12/12/22.  Since his last visit, he presented to the ED on 12/15/22 for left ankle cellulitis. Blood cultures have grown GNRs in aerobic bottles speciated out to Achromobacter dentrificans. MRI showed no osteomyelitis or abscess at the ankle. He had his port removed on 12/20/22 by Dr. Robyne Peers. He was given Meropenem transitioned to Bactrim per ID on 12/20/2022.   His appetite level is at 75%. His energy level is at 0%. He is accompanied by his wife.  He reports a normal appetite. His wife has reported worsened mental confusion by the day to the point he has had multiple instances of not knowing where he is. His wife notes he was sick yesterday at the nursing home and was vomiting, though he is improved today. She reports she had to get him his own vomit bag while nurses stood by and did not help.  His wife and the patient do not feel he receives adequate care at [facility], where he has been for 2-3 days. His wife does not believe he will improve while he resides there. He does have physical therapy while seated, but is otherwise wheelchair bound.    His wife reports his cellulitis in the leg has improved. She notes he is extremely drowsy and sleeping most of the time and usually doesn't respond or talk to her often since he has been discharged from the hospital.   His wife reports he has congestion and is coughing phlegm, though he swallows the phlegm and does not spit it out.   PAST MEDICAL HISTORY:   Past Medical History: Past Medical History:  Diagnosis Date   Allergic rhinitis    Anal fissure    Anginal  pain (HCC)    Asthma    Cancer (HCC)    skin   Chest pain    CHF (congestive heart failure) (HCC)    Coronary heart disease    s/p stenting. cath in 01/2012 noncritical occlusion   Dyspnea    Dysrhythmia    1st degree heart block   GERD (gastroesophageal reflux disease)    Glaucoma    Hiatal hernia    Hyperlipidemia    Hypertension    Hypothyroidism    Idiopathic thrombocytopenic purpura (HCC) 2002   Macular degeneration    Multiple myeloma (HCC) 01/15/2021   Nephrolithiasis    PUD (peptic ulcer disease)    remote   Sarcoidosis    pulmonary  Schatzki's ring     Surgical History: Past Surgical History:  Procedure Laterality Date   cardiac stents     COLONOSCOPY  10/30/2006   Normal rectum, sigmoid diverticula.Remainder of colonic mucosa appeared normal.   CORONARY ANGIOPLASTY WITH STENT PLACEMENT     about 10 years ago per pt (around 2007)   CYSTOSCOPY WITH RETROGRADE PYELOGRAM, URETEROSCOPY AND STENT PLACEMENT Left 06/16/2017   Procedure: CYSTOSCOPY WITH RETROGRADE PYELOGRAM, URETEROSCOPY,STONE EXTRACTION  AND STENT PLACEMENT;  Surgeon: Marcine Matar, MD;  Location: [facility];  Service: Urology;  Laterality: Left;   ESOPHAGOGASTRODUODENOSCOPY  06/19/2004   Two esophageal rings and esophageal web as described above.  All of these were disrupted by passing 56-French Elease Hashimoto dilator/ Candida esophagitis,which appears to be incidental given history of   antibiotic use, but nevertheless will be treated.   ESOPHAGOGASTRODUODENOSCOPY  10/30/2006   Distal tandem esophageal ring status post dilation disruption as  described above.  Otherwise normal esophagus/  Small hiatal hernia otherwise normal stomach, D1 and D2   ESOPHAGOGASTRODUODENOSCOPY N/A 03/22/2015   Dr.Rourk- noncritical schatzki's ring and hiatal hernia-o/w normal EGD.    ESOPHAGOGASTRODUODENOSCOPY (EGD) WITH ESOPHAGEAL DILATION  03/04/2012   RMR- schatzki's ring, hiatal hernia, polypoid gastric mucosa, bx= minimally  active gastritis.   HOLMIUM LASER APPLICATION Left 06/16/2017   Procedure: HOLMIUM LASER APPLICATION;  Surgeon: Marcine Matar, MD;  Location: [facility];  Service: Urology;  Laterality: Left;   IR CV LINE INJECTION  12/27/2021   IR GENERIC HISTORICAL  03/06/2016   IR RADIOLOGIST EVAL & MGMT 03/06/2016 Irish Lack, MD [facility] INTERV RAD   IR GENERIC HISTORICAL  06/18/2016   IR RADIOLOGIST EVAL & MGMT 06/18/2016 Irish Lack, MD [facility] INTERV RAD   IR IMAGING GUIDED PORT INSERTION  12/18/2021   IR IMAGING GUIDED PORT INSERTION  01/15/2022   IR RADIOLOGIST EVAL & MGMT  10/01/2016   IR RADIOLOGIST EVAL & MGMT  10/15/2017   IR RADIOLOGIST EVAL & MGMT  12/24/2018   IR RADIOLOGIST EVAL & MGMT  01/04/2020   IR RADIOLOGIST EVAL & MGMT  06/20/2021   IR REMOVAL TUN ACCESS W/ PORT W/O FL MOD SED  01/15/2022   LEFT HEART CATH N/A 02/02/2012   Procedure: LEFT HEART CATH;  Surgeon: Runell Gess, MD;  Location: [facility] LAB;  Service: Cardiovascular;  Laterality: N/A;   MEDIASTINOSCOPY     for dx sarcoid   PORT-A-CATH REMOVAL Left 12/20/2022   Procedure: MINOR REMOVAL PORT-A-CATH;  Surgeon: Lewie Chamber, DO;  Location: [facility];  Service: General;  Laterality: Left;   RADIOLOGY WITH ANESTHESIA Left 05/17/2016   Procedure: left renal ablation;  Surgeon: Irish Lack, MD;  Location: [facility];  Service: Radiology;  Laterality: Left;    Social History: 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    Employer: RETIRED  Tobacco Use   Smoking status: Former    Current packs/day: 0.00    Average packs/day: 0.1 packs/day for 2.0 years (0.2 ttl pk-yrs)    Types: Cigarettes, Cigars    Start date: 05/06/1968    Quit date: 05/06/1970    Years since quitting: 52.6   Smokeless  tobacco: Never  Vaping Use   Vaping status: Never Used  Substance and Sexual Activity   Alcohol use: No    Alcohol/week: 0.0 standard drinks of alcohol   Drug use: No   Sexual activity: Never  Other Topics Concern   Not on file  Social History Narrative   Not on file   Social Determinants of Health   Financial Resource Strain: Low Risk  (12/02/2022)   Overall Financial Resource Strain (CARDIA)    Difficulty of Paying Living Expenses: Not very hard  Food Insecurity: No Food Insecurity (12/15/2022)   Hunger Vital Sign    Worried About Running Out of Food in the Last Year: Never true    Ran Out of Food in the Last Year: Never true  Transportation Needs: No Transportation Needs (12/15/2022)   PRAPARE - Administrator, Civil Service (Medical): No    Lack of Transportation (Non-Medical): No  Physical Activity: Inactive (11/25/2022)   Exercise Vital Sign    Days of Exercise per Week: 0 days    Minutes of Exercise per Session: 0 min  Stress: No Stress Concern Present (10/16/2021)   Received from [facility] - Occupational Stress Questionnaire    Feeling of Stress : Not at all  Social Connections: Unknown (09/18/2021)   Received from [facility]   Social Network    Social Network: Not on file  Intimate Partner Violence: Not At Risk (12/15/2022)   Humiliation, Afraid, Rape, and Kick questionnaire    Fear of Current or Ex-Partner: No    Emotionally Abused: No    Physically Abused: No    Sexually Abused: No    Family History: Family History  Problem Relation Age of Onset   Heart disease Father        deceased age 21   Stroke Mother    Alzheimer's disease Mother    Heart attack Brother        deceased age 53   Cancer Other        niece   Colon cancer Neg Hx     Current Medications:  Current Outpatient Medications:    acetaminophen (TYLENOL) 325 MG tablet, Take 2 tablets (650 mg total) by mouth every 6 (six) hours as  needed for mild pain (or Fever >/= 101)., Disp: , Rfl:    acyclovir (ZOVIRAX) 400 MG tablet, TAKE 1 TABLET BY MOUTH TWICE  DAILY, Disp: 180 tablet, Rfl: 0   Ascorbic Acid (VITAMIN C PO), Take 500 mg by mouth every evening., Disp: , Rfl:    aspirin EC 81 MG tablet, Take 1 tablet (81 mg total) by mouth daily with breakfast. For 30 days Only (Patient taking differently: Take 81 mg by mouth daily with breakfast. Every Monday, Wednesday, and Friday), Disp: 30 tablet, Rfl: 0   atorvastatin (LIPITOR) 40 MG tablet, TAKE 1 TABLET BY MOUTH IN THE  EVENING (Patient taking differently: Take 40 mg by mouth every evening.), Disp: 90 tablet, Rfl: 1   Bortezomib (VELCADE IJ), Inject as directed once a week., Disp: , Rfl:    brimonidine (ALPHAGAN) 0.2 % ophthalmic solution, Place 1 drop into the left eye 2 (two) times daily., Disp: , Rfl:    cyanocobalamin 1000 MCG tablet, Take 1 tablet (1,000 mcg total) by mouth daily., Disp: 30 tablet, Rfl: 3   feeding supplement (ENSURE ENLIVE / ENSURE PLUS) LIQD, Take 237 mLs by mouth 3 (three) times daily between meals., Disp: , Rfl:    isosorbide mononitrate (IMDUR) 60 MG 24 hr tablet, Take 1  tablet (60 mg total) by mouth in the morning and at bedtime., Disp: , Rfl:    levothyroxine (SYNTHROID) 75 MCG tablet, Take 75 mcg by mouth daily before breakfast., Disp: , Rfl:    lidocaine-prilocaine (EMLA) cream, Apply 1 Application topically as needed., Disp: 30 g, Rfl: 1   losartan (COZAAR) 50 MG tablet, Take 1 tablet (50 mg total) by mouth daily., Disp: , Rfl:    metoprolol tartrate (LOPRESSOR) 25 MG tablet, Take 1 tablet (25 mg total) by mouth 2 (two) times daily. TAKE 1 TABLET BY MOUTH IN  THE MORNING AND ONE-HALF  TABLET BY MOUTH IN THE  EVENING, Disp: , Rfl:    Multiple Vitamins-Minerals (PRESERVISION/LUTEIN) CAPS, Take 1 capsule by mouth 2 (two) times daily., Disp: , Rfl:    nitroGLYCERIN (NITROSTAT) 0.4 MG SL tablet, Place 1 tablet (0.4 mg total) under the tongue every 5 (five)  minutes as needed. For chest pain, Disp: 25 tablet, Rfl: 2   pantoprazole (PROTONIX) 40 MG tablet, TAKE 1 TABLET BY MOUTH DAILY  BEFORE BREAKFAST (Patient taking differently: Take 40 mg by mouth daily.), Disp: 90 tablet, Rfl: 3   prochlorperazine (COMPAZINE) 10 MG tablet, Take 1 tablet (10 mg total) by mouth every 6 (six) hours as needed for nausea or vomiting., Disp: 30 tablet, Rfl: 3   ROCKLATAN 0.02-0.005 % SOLN, Place 1 drop into the left eye at bedtime., Disp: , Rfl:    sulfamethoxazole-trimethoprim (BACTRIM DS) 800-160 MG tablet, Take 1 tablet by mouth every 12 (twelve) hours for 7 days., Disp: , Rfl:    triamcinolone (KENALOG) 0.1 % cream, Apply 1 application. topically 2 (two) times daily as needed (for irritation)., Disp: , Rfl:    vitamin E 200 UNIT capsule, Take 200 Units by mouth every evening., Disp: , Rfl:  No current facility-administered medications for this visit.  Facility-Administered Medications Ordered in Other Visits:    acetaminophen (TYLENOL) 325 MG tablet, , , ,    diphenhydrAMINE (BENADRYL) 25 mg capsule, , , ,    montelukast (SINGULAIR) 10 MG tablet, , , ,    Allergies: Allergies  Allergen Reactions   Azithromycin Other (See Comments)    Sore mouth and fever blisters around mouth, sores in nose area as well   Doxazosin Shortness Of Breath   Atenolol Other (See Comments)    UNKNOWN REACTION   Hydrocodone Nausea And Vomiting   Levofloxacin Other (See Comments)    Caused stomach problems.   Morphine Other (See Comments)    "made me crazy"   Penicillins Nausea And Vomiting and Other (See Comments)    Has patient had a PCN reaction causing immediate rash, facial/tongue/throat swelling, SOB or lightheadedness with hypotension: No Has patient had a PCN reaction causing severe rash involving mucus membranes or skin necrosis: No Has patient had a PCN reaction that required hospitalization No Has patient had a PCN reaction occurring within the last 10 years: No If all  of the above answers are "NO", then may proceed with Cephalosporin use.    Sulfonamide Derivatives Nausea And Vomiting    REVIEW OF SYSTEMS:   Review of Systems  Constitutional:  Negative for chills, fatigue and fever.  HENT:   Negative for lump/mass, mouth sores, nosebleeds, sore throat and trouble swallowing.        +soreness in right ear +congestion  Eyes:  Negative for eye problems.  Respiratory:  Positive for cough (with phlegm). Negative for shortness of breath.   Cardiovascular:  Negative for chest pain,  leg swelling and palpitations.  Gastrointestinal:  Positive for abdominal pain and nausea. Negative for constipation, diarrhea and vomiting.  Genitourinary:  Negative for bladder incontinence, difficulty urinating, dysuria, frequency, hematuria and nocturia.   Musculoskeletal:  Negative for arthralgias, back pain, flank pain, myalgias and neck pain.  Skin:  Negative for itching and rash.  Neurological:  Negative for dizziness, headaches and numbness.  Hematological:  Does not bruise/bleed easily.  Psychiatric/Behavioral:  Positive for confusion. Negative for depression, sleep disturbance and suicidal ideas. The patient is not nervous/anxious.        +extreme drowsiness  All other systems reviewed and are negative.    VITALS:   Blood pressure (!) 92/54, pulse 89, temperature (!) 96.8 F (36 C), temperature source Tympanic, resp. rate 18, SpO2 100%.  Wt Readings from Last 3 Encounters:  12/23/22 117 lb 8.1 oz (53.3 kg)  12/12/22 126 lb 3.2 oz (57.2 kg)  11/28/22 129 lb 3.2 oz (58.6 kg)    There is no height or weight on file to calculate BMI.  Performance status (ECOG): 1 - Symptomatic but completely ambulatory  PHYSICAL EXAM:   Physical Exam Vitals and nursing note reviewed. Exam conducted with a chaperone present.  Constitutional:      Appearance: Normal appearance.  Cardiovascular:     Rate and Rhythm: Normal rate and regular rhythm.     Pulses: Normal pulses.      Heart sounds: Normal heart sounds.  Pulmonary:     Effort: Pulmonary effort is normal.     Breath sounds: Normal breath sounds.  Abdominal:     Palpations: Abdomen is soft. There is no hepatomegaly, splenomegaly or mass.     Tenderness: There is no abdominal tenderness.  Musculoskeletal:     Right lower leg: No edema.     Left lower leg: No edema.  Lymphadenopathy:     Cervical: No cervical adenopathy.     Right cervical: No superficial, deep or posterior cervical adenopathy.    Left cervical: No superficial, deep or posterior cervical adenopathy.     Upper Body:     Right upper body: No supraclavicular or axillary adenopathy.     Left upper body: No supraclavicular or axillary adenopathy.  Neurological:     General: No focal deficit present.     Mental Status: He is alert and oriented to person, place, and time.  Psychiatric:        Mood and Affect: Mood normal.        Behavior: Behavior normal.     LABS:      Latest Ref Rng & Units 12/26/2022    9:33 AM 12/18/2022    4:12 AM 12/17/2022    5:12 AM  CBC  WBC 4.0 - 10.5 K/uL 8.5  4.9  9.1   Hemoglobin 13.0 - 17.0 g/dL 16.1  8.7  9.6   Hematocrit 39.0 - 52.0 % 30.5  26.7  29.4   Platelets 150 - 400 K/uL 244  78  73       Latest Ref Rng & Units 12/26/2022    9:33 AM 12/21/2022    4:25 AM 12/20/2022    4:14 AM  CMP  Glucose 70 - 99 mg/dL 096  94  94   BUN 8 - 23 mg/dL 35  11  11   Creatinine 0.61 - 1.24 mg/dL 0.45  4.09  8.11   Sodium 135 - 145 mmol/L 130  128  128   Potassium 3.5 - 5.1 mmol/L 4.1  3.0  3.4   Chloride 98 - 111 mmol/L 101  98  100   CO2 22 - 32 mmol/L 20  25  24    Calcium 8.9 - 10.3 mg/dL 9.2  8.3  8.2   Total Protein 6.5 - 8.1 g/dL 9.1     Total Bilirubin 0.3 - 1.2 mg/dL 0.4     Alkaline Phos 38 - 126 U/L 70     AST 15 - 41 U/L 20     ALT 0 - 44 U/L 28        No results found for: "CEA1", "CEA" / No results found for: "CEA1", "CEA" No results found for: "PSA1" No results found for:  "SEG315" No results found for: "CAN125"  Lab Results  Component Value Date   TOTALPROTELP 8.1 12/12/2022   ALBUMINELP 3.3 12/12/2022   A1GS 0.2 12/12/2022   A2GS 0.8 12/12/2022   BETS 0.7 12/12/2022   GAMS 3.1 (H) 12/12/2022   MSPIKE 2.8 (H) 12/12/2022   SPEI Comment 12/12/2022   Lab Results  Component Value Date   TIBC 257 08/23/2021   TIBC 298 02/07/2021   TIBC 263 05/27/2017   FERRITIN 1,415 (H) 01/24/2022   FERRITIN 939 (H) 08/23/2021   FERRITIN 123 02/07/2021   IRONPCTSAT 90 (H) 08/23/2021   IRONPCTSAT 17 (L) 02/07/2021   IRONPCTSAT 44 05/27/2017   Lab Results  Component Value Date   LDH 115 05/01/2021   LDH 120 04/09/2021   LDH 124 04/02/2021     STUDIES:   MR ANKLE LEFT W WO CONTRAST  Result Date: 12/17/2022 CLINICAL DATA:  Ankle pain and swelling with bacteremia. Evaluate for septic joint. Recent radiographs obtained for pain after falling. EXAM: MRI OF THE LEFT ANKLE WITHOUT AND WITH CONTRAST TECHNIQUE: Multiplanar, multisequence MR imaging of the ankle was performed before and after the administration of intravenous contrast. CONTRAST:  5mL GADAVIST GADOBUTROL 1 MMOL/ML IV SOLN COMPARISON:  Radiographs of the ankle and lower leg 12/15/2022. FINDINGS: TENDONS Peroneal: Mild longitudinal split tearing of the peroneus brevis tendon. The peroneal tendons are normally located, and there is no significant tenosynovitis or abnormal synovial enhancement. Posteromedial: Intact and normally positioned. Anterior: Intact and normally positioned. Achilles: Intact. Plantar Fascia: Intact. LIGAMENTS Lateral: The anterior and posterior talofibular and calcaneofibular ligaments are intact.The inferior tibiofibular ligaments appear intact. Medial: The deltoid and visualized portions of the spring ligament appear intact. CARTILAGE AND BONES Ankle Joint: No significant ankle joint effusion or abnormal synovial enhancement. The talar dome and tibial plafond appear normal. No subchondral  edema. Subtalar Joints/Sinus Tarsi: No subtalar joint effusion, abnormal enhancement or subchondral edema. The tarsal sinus appears normal. Bones: No evidence of acute fracture, dislocation or osteomyelitis. No significant hindfoot or midfoot arthropathy. Other: Nonspecific generalized subcutaneous edema surrounding the ankle and extending into the dorsal aspect of the midfoot. No focal fluid collection or abnormal enhancement identified. No evidence of foreign body. IMPRESSION: IMPRESSION 1. Nonspecific generalized subcutaneous edema surrounding the ankle and extending into the dorsal aspect of the midfoot. No focal fluid collection or abnormal enhancement identified. 2. No evidence of septic arthritis or osteomyelitis. 3. Longitudinal split tear of the peroneus brevis tendon. The additional ankle tendons and ligaments appear intact. Electronically Signed   By: Carey Bullocks M.D.   On: 12/17/2022 08:18   CT ABDOMEN PELVIS W CONTRAST  Result Date: 12/15/2022 CLINICAL DATA:  Abdominal pain, acute, nonlocalized EXAM: CT ABDOMEN AND PELVIS WITH CONTRAST TECHNIQUE: Multidetector CT imaging of the abdomen and pelvis was  performed using the standard protocol following bolus administration of intravenous contrast. RADIATION DOSE REDUCTION: This exam was performed according to the departmental dose-optimization program which includes automated exposure control, adjustment of the mA and/or kV according to patient size and/or use of iterative reconstruction technique. CONTRAST:  OMNIPAQUE IOHEXOL 300 MG/ML  SOLN COMPARISON:  11/17/2022 FINDINGS: Lower chest: Mass-like opacity at the periphery of the left lower lobe measures approximately 4.6 x 1.3 x 2.6 cm, previously measured approximately 3.9 x 2.0 x 2.6 cm. Mild adjacent ground-glass opacity has slightly improved. Heart size is normal. Coronary artery atherosclerosis. Hepatobiliary: Unchanged appearance of the liver including prominent left hepatic lobe cyst.  No new focal liver abnormality. Unremarkable gallbladder. No hyperdense gallstone. Similar mild biliary dilatation. Pancreas: Unremarkable. No pancreatic ductal dilatation or surrounding inflammatory changes. Spleen: Normal in size without focal abnormality. Adrenals/Urinary Tract: Unremarkable adrenal glands. Kidneys enhance symmetrically without focal lesion, stone, or hydronephrosis. Ureters are nondilated. There is a anterior urinary bladder wall diverticulum. Stomach/Bowel: Stomach within normal limits. No abnormally dilated loops of bowel. Mild wall thickening of the proximal to mid transverse colon. Extensive sigmoid diverticulosis. Moderate volume of stool within the colon. Vascular/Lymphatic: Aortic atherosclerosis. No enlarged abdominal or pelvic lymph nodes. Reproductive: Prostate is unremarkable. Other: No free fluid. No abdominopelvic fluid collection. No pneumoperitoneum. Small fat containing right inguinal hernia. Musculoskeletal: No acute or significant osseous findings. IMPRESSION: 1. Mild wall thickening of the proximal to mid transverse colon, which may be secondary to underdistention or mild colitis. 2. Extensive sigmoid diverticulosis without evidence of acute diverticulitis. 3. Mass-like opacity at the periphery of the left lower lobe measures approximately 4.6 x 1.3 x 2.6 cm, previously measured approximately 3.9 x 2.0 x 2.6 cm. Consider one of the following in 3 months for both low-risk and high-risk individuals: (a) repeat chest CT, (b) follow-up PET-CT, or (c) tissue sampling. This recommendation follows the consensus statement: Guidelines for Management of Incidental Pulmonary Nodules Detected on CT Images: From the Fleischner Society 2017; Radiology 2017; 284:228-243. 4. Aortic and coronary artery atherosclerosis (ICD10-I70.0). Electronically Signed   By: Duanne Guess D.O.   On: 12/15/2022 15:40   CT Head Wo Contrast  Result Date: 12/15/2022 CLINICAL DATA:  Mental status change,  unknown cause EXAM: CT HEAD WITHOUT CONTRAST TECHNIQUE: Contiguous axial images were obtained from the base of the skull through the vertex without intravenous contrast. RADIATION DOSE REDUCTION: This exam was performed according to the departmental dose-optimization program which includes automated exposure control, adjustment of the mA and/or kV according to patient size and/or use of iterative reconstruction technique. COMPARISON:  10/07/2022 FINDINGS: Brain: No evidence of acute infarction, hemorrhage, hydrocephalus, extra-axial collection or mass lesion/mass effect. Patchy low-density changes within the periventricular and subcortical white matter most compatible with chronic microvascular ischemic change. Mild diffuse cerebral volume loss. Vascular: Atherosclerotic calcifications involving the large vessels of the skull base. No unexpected hyperdense vessel. Skull: Normal. Negative for fracture or focal lesion. Sinuses/Orbits: Chronic left paranasal sinus mucosal thickening. Scattered opacification within the left ethmoid air cells. Other: None. IMPRESSION: 1. No acute intracranial findings. 2. Chronic microvascular ischemic change and cerebral volume loss. Electronically Signed   By: Duanne Guess D.O.   On: 12/15/2022 15:29   DG Ankle Complete Left  Result Date: 12/15/2022 CLINICAL DATA:  89 year old male with history of trauma from a fall complaining of pain. EXAM: LEFT ANKLE COMPLETE - 3+ VIEW COMPARISON:  None Available. FINDINGS: There is no evidence of fracture, dislocation, or joint effusion. Multifocal  joint space narrowing, subchondral sclerosis and osteophyte formation noted in the tibiotalar joint and throughout the visualized portions of the midfoot and hindfoot, indicative of osteoarthritis. Numerous vascular calcifications. Mild soft tissue swelling around the ankle, most evident overlying the lateral malleolus. IMPRESSION: 1. Mild soft tissue swelling, most evident overlying the lateral  malleolus. No underlying acute osseous abnormality. 2. Degenerative changes of osteoarthritis, as above. 3. Atherosclerosis. Electronically Signed   By: Trudie Reed M.D.   On: 12/15/2022 12:38   DG Chest 1 View  Result Date: 12/15/2022 CLINICAL DATA:  89 year old male with history of trauma from a fall. Chest pain. EXAM: CHEST  1 VIEW COMPARISON:  Chest x-ray 11/17/2022. FINDINGS: Left-sided single-lumen Port-A-Cath with tip terminating near the cavoatrial junction. Lung volumes are slightly low. Diffuse interstitial prominence and widespread peribronchial cuffing with an overall reticulonodular appearance to the lungs which is very similar to the prior study. No new acute consolidative airspace disease. No pleural effusions. No pneumothorax. No evidence of pulmonary edema. Heart size is normal. Upper mediastinal contours are within normal limits. IMPRESSION: 1. No acute findings in the thorax. 2. Chronic parenchymal changes in the lungs suggestive of chronic bronchitis and probable multifocal chronic postinfectious or inflammatory scarring, similar to prior studies, as above. 3. Aortic atherosclerosis. Electronically Signed   By: Trudie Reed M.D.   On: 12/15/2022 12:26   DG Tibia/Fibula Left  Result Date: 12/15/2022 CLINICAL DATA:  89 year old male with history of trauma from a fall. Left leg pain. EXAM: LEFT TIBIA AND FIBULA - 2 VIEW COMPARISON:  No priors. FINDINGS: Four views of the left leg demonstrate no acute displaced fracture of the tibia or fibula. Numerous vascular calcifications are noted. IMPRESSION: 1. No acute radiographic abnormality of the left tibia or fibula. 2. Atherosclerosis. Electronically Signed   By: Trudie Reed M.D.   On: 12/15/2022 12:22   DG Ankle Complete Right  Result Date: 12/15/2022 CLINICAL DATA:  89 year old male with history of trauma from a fall complaining of left ankle pain. EXAM: RIGHT ANKLE - COMPLETE 3+ VIEW COMPARISON:  No priors. FINDINGS: There  is no evidence of fracture, dislocation, or joint effusion. Degenerative changes of osteoarthritis are noted at the tibiotalar joint and throughout the visualized portions of the midfoot and hindfoot. Numerous vascular calcifications are noted. Soft tissues are otherwise unremarkable. IMPRESSION: 1. No acute radiographic abnormality of the right ankle. 2. Osteoarthritis, as above. 3. Atherosclerosis. Electronically Signed   By: Trudie Reed M.D.   On: 12/15/2022 12:22

## 2022-12-26 ENCOUNTER — Inpatient Hospital Stay (HOSPITAL_BASED_OUTPATIENT_CLINIC_OR_DEPARTMENT_OTHER): Payer: Medicare Other | Admitting: Hematology

## 2022-12-26 ENCOUNTER — Inpatient Hospital Stay: Payer: Medicare Other

## 2022-12-26 VITALS — BP 92/54 | HR 89 | Temp 96.8°F | Resp 18

## 2022-12-26 DIAGNOSIS — I2510 Atherosclerotic heart disease of native coronary artery without angina pectoris: Secondary | ICD-10-CM | POA: Diagnosis not present

## 2022-12-26 DIAGNOSIS — D6481 Anemia due to antineoplastic chemotherapy: Secondary | ICD-10-CM | POA: Diagnosis not present

## 2022-12-26 DIAGNOSIS — D630 Anemia in neoplastic disease: Secondary | ICD-10-CM | POA: Diagnosis not present

## 2022-12-26 DIAGNOSIS — Z79620 Long term (current) use of immunosuppressive biologic: Secondary | ICD-10-CM | POA: Diagnosis not present

## 2022-12-26 DIAGNOSIS — I10 Essential (primary) hypertension: Secondary | ICD-10-CM | POA: Diagnosis not present

## 2022-12-26 DIAGNOSIS — C9000 Multiple myeloma not having achieved remission: Secondary | ICD-10-CM | POA: Diagnosis not present

## 2022-12-26 DIAGNOSIS — Z5112 Encounter for antineoplastic immunotherapy: Secondary | ICD-10-CM | POA: Diagnosis not present

## 2022-12-26 DIAGNOSIS — G9341 Metabolic encephalopathy: Secondary | ICD-10-CM | POA: Diagnosis not present

## 2022-12-26 DIAGNOSIS — T451X5A Adverse effect of antineoplastic and immunosuppressive drugs, initial encounter: Secondary | ICD-10-CM | POA: Diagnosis not present

## 2022-12-26 DIAGNOSIS — E039 Hypothyroidism, unspecified: Secondary | ICD-10-CM | POA: Diagnosis not present

## 2022-12-26 DIAGNOSIS — E871 Hypo-osmolality and hyponatremia: Secondary | ICD-10-CM | POA: Diagnosis not present

## 2022-12-26 DIAGNOSIS — I509 Heart failure, unspecified: Secondary | ICD-10-CM | POA: Diagnosis not present

## 2022-12-26 DIAGNOSIS — M6281 Muscle weakness (generalized): Secondary | ICD-10-CM | POA: Diagnosis not present

## 2022-12-26 LAB — COMPREHENSIVE METABOLIC PANEL
ALT: 28 U/L (ref 0–44)
AST: 20 U/L (ref 15–41)
Albumin: 2.9 g/dL — ABNORMAL LOW (ref 3.5–5.0)
Alkaline Phosphatase: 70 U/L (ref 38–126)
Anion gap: 9 (ref 5–15)
BUN: 35 mg/dL — ABNORMAL HIGH (ref 8–23)
CO2: 20 mmol/L — ABNORMAL LOW (ref 22–32)
Calcium: 9.2 mg/dL (ref 8.9–10.3)
Chloride: 101 mmol/L (ref 98–111)
Creatinine, Ser: 1.76 mg/dL — ABNORMAL HIGH (ref 0.61–1.24)
GFR, Estimated: 36 mL/min — ABNORMAL LOW (ref >60)
Glucose, Bld: 134 mg/dL — ABNORMAL HIGH (ref 70–99)
Potassium: 4.1 mmol/L (ref 3.5–5.1)
Sodium: 130 mmol/L — ABNORMAL LOW (ref 135–145)
Total Bilirubin: 0.4 mg/dL (ref 0.3–1.2)
Total Protein: 9.1 g/dL — ABNORMAL HIGH (ref 6.5–8.1)

## 2022-12-26 LAB — CBC WITH DIFFERENTIAL/PLATELET
Abs Immature Granulocytes: 0.04 10*3/uL (ref 0.00–0.07)
Basophils Absolute: 0 10*3/uL (ref 0.0–0.1)
Basophils Relative: 0 %
Eosinophils Absolute: 0.2 10*3/uL (ref 0.0–0.5)
Eosinophils Relative: 2 %
HCT: 30.5 % — ABNORMAL LOW (ref 39.0–52.0)
Hemoglobin: 10.1 g/dL — ABNORMAL LOW (ref 13.0–17.0)
Immature Granulocytes: 1 %
Lymphocytes Relative: 10 %
Lymphs Abs: 0.9 10*3/uL (ref 0.7–4.0)
MCH: 36.9 pg — ABNORMAL HIGH (ref 26.0–34.0)
MCHC: 33.1 g/dL (ref 30.0–36.0)
MCV: 111.3 fL — ABNORMAL HIGH (ref 80.0–100.0)
Monocytes Absolute: 0.8 10*3/uL (ref 0.1–1.0)
Monocytes Relative: 9 %
Neutro Abs: 6.6 10*3/uL (ref 1.7–7.7)
Neutrophils Relative %: 78 %
Platelets: 244 10*3/uL (ref 150–400)
RBC: 2.74 MIL/uL — ABNORMAL LOW (ref 4.22–5.81)
RDW: 15.4 % (ref 11.5–15.5)
WBC: 8.5 10*3/uL (ref 4.0–10.5)
nRBC: 0 % (ref 0.0–0.2)

## 2022-12-26 LAB — SAMPLE TO BLOOD BANK

## 2022-12-26 LAB — MAGNESIUM: Magnesium: 2.4 mg/dL (ref 1.7–2.4)

## 2022-12-26 NOTE — Patient Instructions (Addendum)
[facility]  Discharge Instructions  You were seen and examined today by Dr. Ellin Saba.  Dr. Ellin Saba discussed your most recent hospitalization.  If you would like to be moved from [facility], you may ask to speak with the social worker at [facility].  Dr. Ellin Saba will check labs today.   You are too weak to take any treatments for your myeloma at this time.  Follow-up as scheduled.  Thank you for choosing [facility] to provide your oncology and hematology care.   To afford each patient quality time with our provider, please arrive at least 15 minutes before your scheduled appointment time. You may need to reschedule your appointment if you arrive late (10 or more minutes). Arriving late affects you and other patients whose appointments are after yours.  Also, if you miss three or more appointments without notifying the office, you may be dismissed from the clinic at the provider's discretion.    Again, thank you for choosing [facility].  Our hope is that these requests will decrease the amount of time that you wait before being seen by our physicians.   If you have a lab appointment with the Cancer Center - please note that after April 8th, all labs will be drawn in the cancer center.  You do not have to check in or register with the main entrance as you have in the past but will complete your check-in at the cancer center.            _____________________________________________________________  Should you have questions after your visit to [facility], please contact our office at [phone] and follow the prompts.  Our office hours are 8:00 a.m. to 4:30 p.m. Monday - Thursday and 8:00 a.m. to 2:30 p.m. Friday.  Please note that voicemails left after 4:00 p.m. may not be returned until the following business day.  We are closed weekends and all major holidays.  You do have access to a  nurse 24-7, just call the main number to the clinic [phone] and do not press any options, hold on the line and a nurse will answer the phone.    For prescription refill requests, have your pharmacy contact our office and allow 72 hours.    Masks are no longer required in the cancer centers. If you would like for your care team to wear a mask while they are taking care of you, please let them know. You may have one support person who is at least 87 years old accompany you for your appointments.

## 2022-12-27 ENCOUNTER — Inpatient Hospital Stay: Payer: Medicare Other

## 2022-12-27 VITALS — BP 134/73 | HR 73 | Temp 97.2°F | Resp 17

## 2022-12-27 DIAGNOSIS — Z79620 Long term (current) use of immunosuppressive biologic: Secondary | ICD-10-CM | POA: Diagnosis not present

## 2022-12-27 DIAGNOSIS — C9000 Multiple myeloma not having achieved remission: Secondary | ICD-10-CM | POA: Diagnosis not present

## 2022-12-27 DIAGNOSIS — D6481 Anemia due to antineoplastic chemotherapy: Secondary | ICD-10-CM | POA: Diagnosis not present

## 2022-12-27 DIAGNOSIS — D630 Anemia in neoplastic disease: Secondary | ICD-10-CM | POA: Diagnosis not present

## 2022-12-27 DIAGNOSIS — Z5112 Encounter for antineoplastic immunotherapy: Secondary | ICD-10-CM | POA: Diagnosis not present

## 2022-12-27 DIAGNOSIS — T451X5A Adverse effect of antineoplastic and immunosuppressive drugs, initial encounter: Secondary | ICD-10-CM | POA: Diagnosis not present

## 2022-12-27 DIAGNOSIS — R7989 Other specified abnormal findings of blood chemistry: Secondary | ICD-10-CM

## 2022-12-27 MED ORDER — SODIUM CHLORIDE 0.9 % IV SOLN: Freq: Once | INTRAVENOUS | Status: AC

## 2022-12-27 NOTE — Progress Notes (Signed)
Patient presents today for IVF per Dr. Ellin Saba for elevated serum creatinine.  Patient is in satisfactory condition and does not have any new complaints.  Vital signs are stable. L chest port was removed per general surgery note on 12/20/22.  IV placed in L arm.  IV flushed well with good blood return noted.  We will proceed with IVF per MD orders.   Patient tolerated IVF well with no complaints voiced.  Patient left via wheelchair with [facility] staff in stable condition.  Vital signs stable at discharge.  Follow up as scheduled.

## 2022-12-27 NOTE — Patient Instructions (Signed)

## 2022-12-30 DIAGNOSIS — I10 Essential (primary) hypertension: Secondary | ICD-10-CM | POA: Diagnosis not present

## 2022-12-30 DIAGNOSIS — E871 Hypo-osmolality and hyponatremia: Secondary | ICD-10-CM | POA: Diagnosis not present

## 2022-12-30 DIAGNOSIS — C9000 Multiple myeloma not having achieved remission: Secondary | ICD-10-CM | POA: Diagnosis not present

## 2022-12-30 DIAGNOSIS — I509 Heart failure, unspecified: Secondary | ICD-10-CM | POA: Diagnosis not present

## 2022-12-30 DIAGNOSIS — G9341 Metabolic encephalopathy: Secondary | ICD-10-CM | POA: Diagnosis not present

## 2022-12-30 DIAGNOSIS — E039 Hypothyroidism, unspecified: Secondary | ICD-10-CM | POA: Diagnosis not present

## 2022-12-30 DIAGNOSIS — I2510 Atherosclerotic heart disease of native coronary artery without angina pectoris: Secondary | ICD-10-CM | POA: Diagnosis not present

## 2022-12-30 DIAGNOSIS — M6281 Muscle weakness (generalized): Secondary | ICD-10-CM | POA: Diagnosis not present

## 2022-12-31 ENCOUNTER — Telehealth: Payer: Self-pay | Admitting: *Deleted

## 2022-12-31 NOTE — Progress Notes (Signed)
  Care Coordination Note  12/31/2022 Name: Dale [name] MRN: [id number] DOB: 08-12-1928  [name] is a 89 y.o. year old male who is a primary care patient of Elfredia Nevins, MD and is actively engaged with the care management team. I reached out to Kennon Portela by phone today to assist with scheduling a follow up visit with the RN Case Manager  Follow up plan: Telephone appointment with care management team member scheduled for:01/02/23  [facility] Coordination Care Guide  Direct Dial: [phone]

## 2022-12-31 NOTE — Progress Notes (Signed)
  Care Coordination Note  12/31/2022 Name: [name] MRN: [id number] DOB: January 18, 1929  Dale [name] is a 89 y.o. year old male who is a primary care patient of Elfredia Nevins, MD and is actively engaged with the care management team. I reached out to Kennon Portela by phone today to assist with re-scheduling a follow up visit with the RN Case Manager  Follow up plan: Unsuccessful telephone outreach attempt made. A HIPAA compliant phone message was left for the patient providing contact information and requesting a return call.  [facility]  Care Coordination Care Guide  Direct Dial: [phone]

## 2023-01-01 ENCOUNTER — Inpatient Hospital Stay: Payer: Medicare Other

## 2023-01-01 ENCOUNTER — Inpatient Hospital Stay: Payer: Medicare Other | Admitting: Hematology

## 2023-01-01 DIAGNOSIS — I2510 Atherosclerotic heart disease of native coronary artery without angina pectoris: Secondary | ICD-10-CM | POA: Diagnosis not present

## 2023-01-01 DIAGNOSIS — E039 Hypothyroidism, unspecified: Secondary | ICD-10-CM | POA: Diagnosis not present

## 2023-01-01 DIAGNOSIS — I10 Essential (primary) hypertension: Secondary | ICD-10-CM | POA: Diagnosis not present

## 2023-01-01 DIAGNOSIS — G9341 Metabolic encephalopathy: Secondary | ICD-10-CM | POA: Diagnosis not present

## 2023-01-01 DIAGNOSIS — I509 Heart failure, unspecified: Secondary | ICD-10-CM | POA: Diagnosis not present

## 2023-01-01 DIAGNOSIS — C9000 Multiple myeloma not having achieved remission: Secondary | ICD-10-CM | POA: Diagnosis not present

## 2023-01-01 DIAGNOSIS — M6281 Muscle weakness (generalized): Secondary | ICD-10-CM | POA: Diagnosis not present

## 2023-01-01 DIAGNOSIS — E871 Hypo-osmolality and hyponatremia: Secondary | ICD-10-CM | POA: Diagnosis not present

## 2023-01-02 ENCOUNTER — Encounter: Payer: Self-pay | Admitting: Physician Assistant

## 2023-01-02 ENCOUNTER — Telehealth: Payer: Self-pay | Admitting: *Deleted

## 2023-01-02 ENCOUNTER — Encounter: Payer: Self-pay | Admitting: *Deleted

## 2023-01-02 NOTE — Progress Notes (Signed)
Care Coordination Note  01/02/2023 Name: KHALID BERNARDY MRN: [id number] DOB: 1928/07/11  Dale [name] is a 89 y.o. year old male who is a primary care patient of Elfredia Nevins, MD and is actively engaged with the care management team. I reached out to Kennon Portela by phone today to assist with re-scheduling a follow up visit with the RN Case Manager  Follow up plan: Per pt wife  Sabino Gasser, pt is in nursing home - doesn't know when/if pt will come home.   Burman Nieves, CCMA Care Coordination Care Guide Direct Dial: [phone]

## 2023-01-03 DIAGNOSIS — I509 Heart failure, unspecified: Secondary | ICD-10-CM | POA: Diagnosis not present

## 2023-01-03 DIAGNOSIS — G9341 Metabolic encephalopathy: Secondary | ICD-10-CM | POA: Diagnosis not present

## 2023-01-03 DIAGNOSIS — I10 Essential (primary) hypertension: Secondary | ICD-10-CM | POA: Diagnosis not present

## 2023-01-03 DIAGNOSIS — M6281 Muscle weakness (generalized): Secondary | ICD-10-CM | POA: Diagnosis not present

## 2023-01-03 DIAGNOSIS — E039 Hypothyroidism, unspecified: Secondary | ICD-10-CM | POA: Diagnosis not present

## 2023-01-03 DIAGNOSIS — E871 Hypo-osmolality and hyponatremia: Secondary | ICD-10-CM | POA: Diagnosis not present

## 2023-01-03 DIAGNOSIS — I2510 Atherosclerotic heart disease of native coronary artery without angina pectoris: Secondary | ICD-10-CM | POA: Diagnosis not present

## 2023-01-03 DIAGNOSIS — C9000 Multiple myeloma not having achieved remission: Secondary | ICD-10-CM | POA: Diagnosis not present

## 2023-01-06 DIAGNOSIS — I509 Heart failure, unspecified: Secondary | ICD-10-CM | POA: Diagnosis not present

## 2023-01-06 DIAGNOSIS — L509 Urticaria, unspecified: Secondary | ICD-10-CM | POA: Diagnosis not present

## 2023-01-06 DIAGNOSIS — E039 Hypothyroidism, unspecified: Secondary | ICD-10-CM | POA: Diagnosis not present

## 2023-01-06 DIAGNOSIS — E871 Hypo-osmolality and hyponatremia: Secondary | ICD-10-CM | POA: Diagnosis not present

## 2023-01-06 DIAGNOSIS — C9000 Multiple myeloma not having achieved remission: Secondary | ICD-10-CM | POA: Diagnosis not present

## 2023-01-06 DIAGNOSIS — I2510 Atherosclerotic heart disease of native coronary artery without angina pectoris: Secondary | ICD-10-CM | POA: Diagnosis not present

## 2023-01-06 DIAGNOSIS — G9341 Metabolic encephalopathy: Secondary | ICD-10-CM | POA: Diagnosis not present

## 2023-01-06 DIAGNOSIS — M6281 Muscle weakness (generalized): Secondary | ICD-10-CM | POA: Diagnosis not present

## 2023-01-08 DIAGNOSIS — I2510 Atherosclerotic heart disease of native coronary artery without angina pectoris: Secondary | ICD-10-CM | POA: Diagnosis not present

## 2023-01-08 DIAGNOSIS — E871 Hypo-osmolality and hyponatremia: Secondary | ICD-10-CM | POA: Diagnosis not present

## 2023-01-08 DIAGNOSIS — M6281 Muscle weakness (generalized): Secondary | ICD-10-CM | POA: Diagnosis not present

## 2023-01-08 DIAGNOSIS — G9341 Metabolic encephalopathy: Secondary | ICD-10-CM | POA: Diagnosis not present

## 2023-01-08 DIAGNOSIS — E039 Hypothyroidism, unspecified: Secondary | ICD-10-CM | POA: Diagnosis not present

## 2023-01-08 DIAGNOSIS — I509 Heart failure, unspecified: Secondary | ICD-10-CM | POA: Diagnosis not present

## 2023-01-08 DIAGNOSIS — L509 Urticaria, unspecified: Secondary | ICD-10-CM | POA: Diagnosis not present

## 2023-01-08 DIAGNOSIS — C9000 Multiple myeloma not having achieved remission: Secondary | ICD-10-CM | POA: Diagnosis not present

## 2023-01-09 ENCOUNTER — Encounter: Payer: Self-pay | Admitting: Hematology

## 2023-01-09 ENCOUNTER — Inpatient Hospital Stay: Payer: No Typology Code available for payment source

## 2023-01-09 ENCOUNTER — Inpatient Hospital Stay: Payer: No Typology Code available for payment source | Admitting: Hematology

## 2023-01-09 DIAGNOSIS — L299 Pruritus, unspecified: Secondary | ICD-10-CM | POA: Diagnosis not present

## 2023-01-09 DIAGNOSIS — G4709 Other insomnia: Secondary | ICD-10-CM | POA: Diagnosis not present

## 2023-01-10 DIAGNOSIS — G9341 Metabolic encephalopathy: Secondary | ICD-10-CM | POA: Diagnosis not present

## 2023-01-10 DIAGNOSIS — E039 Hypothyroidism, unspecified: Secondary | ICD-10-CM | POA: Diagnosis not present

## 2023-01-10 DIAGNOSIS — I2510 Atherosclerotic heart disease of native coronary artery without angina pectoris: Secondary | ICD-10-CM | POA: Diagnosis not present

## 2023-01-10 DIAGNOSIS — I509 Heart failure, unspecified: Secondary | ICD-10-CM | POA: Diagnosis not present

## 2023-01-10 DIAGNOSIS — L509 Urticaria, unspecified: Secondary | ICD-10-CM | POA: Diagnosis not present

## 2023-01-10 DIAGNOSIS — M6281 Muscle weakness (generalized): Secondary | ICD-10-CM | POA: Diagnosis not present

## 2023-01-10 DIAGNOSIS — E871 Hypo-osmolality and hyponatremia: Secondary | ICD-10-CM | POA: Diagnosis not present

## 2023-01-10 DIAGNOSIS — C9000 Multiple myeloma not having achieved remission: Secondary | ICD-10-CM | POA: Diagnosis not present

## 2023-01-11 DIAGNOSIS — R059 Cough, unspecified: Secondary | ICD-10-CM | POA: Diagnosis not present

## 2023-01-13 ENCOUNTER — Inpatient Hospital Stay: Payer: No Typology Code available for payment source | Attending: Hematology

## 2023-01-13 ENCOUNTER — Emergency Department (HOSPITAL_COMMUNITY)
Admission: EM | Admit: 2023-01-13 | Discharge: 2023-01-13 | Disposition: A | Discharge status: Home / Self Care | Payer: Medicare Other | Attending: Emergency Medicine | Admitting: Emergency Medicine

## 2023-01-13 ENCOUNTER — Other Ambulatory Visit: Payer: Self-pay

## 2023-01-13 ENCOUNTER — Inpatient Hospital Stay (HOSPITAL_BASED_OUTPATIENT_CLINIC_OR_DEPARTMENT_OTHER): Payer: No Typology Code available for payment source | Admitting: Hematology

## 2023-01-13 ENCOUNTER — Encounter (HOSPITAL_COMMUNITY): Payer: Self-pay | Admitting: Emergency Medicine

## 2023-01-13 ENCOUNTER — Emergency Department (HOSPITAL_COMMUNITY): Payer: Medicare Other

## 2023-01-13 VITALS — BP 131/55 | HR 71 | Temp 98.5°F | Resp 16

## 2023-01-13 DIAGNOSIS — J45909 Unspecified asthma, uncomplicated: Secondary | ICD-10-CM | POA: Insufficient documentation

## 2023-01-13 DIAGNOSIS — M6281 Muscle weakness (generalized): Secondary | ICD-10-CM | POA: Diagnosis not present

## 2023-01-13 DIAGNOSIS — I110 Hypertensive heart disease with heart failure: Secondary | ICD-10-CM | POA: Insufficient documentation

## 2023-01-13 DIAGNOSIS — N492 Inflammatory disorders of scrotum: Secondary | ICD-10-CM | POA: Diagnosis not present

## 2023-01-13 DIAGNOSIS — I2510 Atherosclerotic heart disease of native coronary artery without angina pectoris: Secondary | ICD-10-CM | POA: Diagnosis not present

## 2023-01-13 DIAGNOSIS — N50811 Right testicular pain: Secondary | ICD-10-CM

## 2023-01-13 DIAGNOSIS — Z7982 Long term (current) use of aspirin: Secondary | ICD-10-CM | POA: Diagnosis not present

## 2023-01-13 DIAGNOSIS — E039 Hypothyroidism, unspecified: Secondary | ICD-10-CM | POA: Diagnosis not present

## 2023-01-13 DIAGNOSIS — D539 Nutritional anemia, unspecified: Secondary | ICD-10-CM | POA: Insufficient documentation

## 2023-01-13 DIAGNOSIS — E871 Hypo-osmolality and hyponatremia: Secondary | ICD-10-CM | POA: Diagnosis not present

## 2023-01-13 DIAGNOSIS — R300 Dysuria: Secondary | ICD-10-CM

## 2023-01-13 DIAGNOSIS — N451 Epididymitis: Secondary | ICD-10-CM | POA: Diagnosis not present

## 2023-01-13 DIAGNOSIS — I509 Heart failure, unspecified: Secondary | ICD-10-CM | POA: Insufficient documentation

## 2023-01-13 DIAGNOSIS — L509 Urticaria, unspecified: Secondary | ICD-10-CM | POA: Diagnosis not present

## 2023-01-13 DIAGNOSIS — Z79899 Other long term (current) drug therapy: Secondary | ICD-10-CM | POA: Diagnosis not present

## 2023-01-13 DIAGNOSIS — C9000 Multiple myeloma not having achieved remission: Secondary | ICD-10-CM | POA: Diagnosis not present

## 2023-01-13 DIAGNOSIS — G9341 Metabolic encephalopathy: Secondary | ICD-10-CM | POA: Diagnosis not present

## 2023-01-13 LAB — COMPREHENSIVE METABOLIC PANEL
ALT: 23 U/L (ref 0–44)
AST: 20 U/L (ref 15–41)
Albumin: 2.8 g/dL — ABNORMAL LOW (ref 3.5–5.0)
Alkaline Phosphatase: 84 U/L (ref 38–126)
Anion gap: 3 — ABNORMAL LOW (ref 5–15)
BUN: 18 mg/dL (ref 8–23)
CO2: 23 mmol/L (ref 22–32)
Calcium: 9 mg/dL (ref 8.9–10.3)
Chloride: 107 mmol/L (ref 98–111)
Creatinine, Ser: 0.83 mg/dL (ref 0.61–1.24)
GFR, Estimated: >60 mL/min (ref >60)
Glucose, Bld: 96 mg/dL (ref 70–99)
Potassium: 3.7 mmol/L (ref 3.5–5.1)
Sodium: 133 mmol/L — ABNORMAL LOW (ref 135–145)
Total Bilirubin: 0.5 mg/dL (ref 0.3–1.2)
Total Protein: 8.6 g/dL — ABNORMAL HIGH (ref 6.5–8.1)

## 2023-01-13 LAB — CBC WITH DIFFERENTIAL/PLATELET
Abs Immature Granulocytes: 0.03 10*3/uL (ref 0.00–0.07)
Basophils Absolute: 0 10*3/uL (ref 0.0–0.1)
Basophils Relative: 0 %
Eosinophils Absolute: 1.1 10*3/uL — ABNORMAL HIGH (ref 0.0–0.5)
Eosinophils Relative: 15 %
HCT: 25.3 % — ABNORMAL LOW (ref 39.0–52.0)
Hemoglobin: 8.5 g/dL — ABNORMAL LOW (ref 13.0–17.0)
Immature Granulocytes: 0 %
Lymphocytes Relative: 21 %
Lymphs Abs: 1.6 10*3/uL (ref 0.7–4.0)
MCH: 37.4 pg — ABNORMAL HIGH (ref 26.0–34.0)
MCHC: 33.6 g/dL (ref 30.0–36.0)
MCV: 111.5 fL — ABNORMAL HIGH (ref 80.0–100.0)
Monocytes Absolute: 1 10*3/uL (ref 0.1–1.0)
Monocytes Relative: 14 %
Neutro Abs: 3.8 10*3/uL (ref 1.7–7.7)
Neutrophils Relative %: 50 %
Platelets: 170 10*3/uL (ref 150–400)
RBC: 2.27 MIL/uL — ABNORMAL LOW (ref 4.22–5.81)
RDW: 15.2 % (ref 11.5–15.5)
WBC: 7.6 10*3/uL (ref 4.0–10.5)
nRBC: 0 % (ref 0.0–0.2)

## 2023-01-13 LAB — URINALYSIS, ROUTINE W REFLEX MICROSCOPIC
Bacteria, UA: NONE SEEN
Bilirubin Urine: NEGATIVE
Glucose, UA: NEGATIVE mg/dL
Hgb urine dipstick: NEGATIVE
Ketones, ur: NEGATIVE mg/dL
Leukocytes,Ua: NEGATIVE
Nitrite: NEGATIVE
Protein, ur: 30 mg/dL — AB
Specific Gravity, Urine: 1.017 (ref 1.005–1.030)
pH: 6 (ref 5.0–8.0)

## 2023-01-13 LAB — MAGNESIUM: Magnesium: 1.8 mg/dL (ref 1.7–2.4)

## 2023-01-13 LAB — SAMPLE TO BLOOD BANK

## 2023-01-13 MED ORDER — FENTANYL CITRATE PF 50 MCG/ML IJ SOSY
50.0000 ug | PREFILLED_SYRINGE | Freq: Once | INTRAMUSCULAR | Status: AC
  Administered 2023-01-13: 50 ug via INTRAVENOUS
  Filled 2023-01-13: qty 1

## 2023-01-13 MED ORDER — OXYCODONE HCL 5 MG PO TABS: 5.0000 mg | ORAL_TABLET | Freq: Four times a day (QID) | ORAL | 0 refills | Status: DC | PRN

## 2023-01-13 MED ORDER — ACETAMINOPHEN 325 MG PO TABS
650.0000 mg | ORAL_TABLET | Freq: Once | ORAL | Status: DC
  Filled 2023-01-13: qty 2

## 2023-01-13 MED ORDER — NYSTATIN 100000 UNIT/GM EX POWD: 1.0000 | Freq: Three times a day (TID) | CUTANEOUS | 0 refills | Status: DC

## 2023-01-13 NOTE — ED Provider Triage Note (Signed)
Emergency Medicine Provider Triage Evaluation Note  Dale [name] , a 89 y.o. male  was evaluated in triage.  Pt complains of scrotal swelling.  Review of Systems  Positive: Groin Rash/swelling, groin pain  Negative: AMS  Physical Exam  BP (!) 138/104   Pulse 84   Temp 98.3 F (36.8 C) (Oral)   Resp 18   SpO2 92%  Gen:   Awake, no distress   Resp:  Normal effort, CTAB  Medical Decision Making  Medically screening exam initiated at 3:22 PM.  Appropriate orders placed.  POL HODO was informed that the remainder of the evaluation will be completed by another provider, this initial triage assessment does not replace that evaluation, and the importance of remaining in the ED until their evaluation is complete.  Groin swelling, seen at Cancer center, sent here, patient reporting pain currently manageable. Labs drawn recently.    Smitty Knudsen, PA-C 01/13/23 1531

## 2023-01-13 NOTE — Progress Notes (Unsigned)
Patients port has been removed.

## 2023-01-13 NOTE — Progress Notes (Signed)
[facility] [address] [place] [address]    Clinic Day:  01/13/2023  Referring physician: Elfredia Nevins, MD  Patient Care Team: Elfredia Nevins, MD as PCP - General (Internal Medicine) Lennette Bihari, MD as PCP - Cardiology (Cardiology) Lennette Bihari, MD as Consulting Physician (Cardiology) Jena Gauss Gerrit Friends, MD as Consulting Physician (Gastroenterology) Doreatha Massed, MD as Medical Oncologist (Oncology) Gwenith Daily, RN as [facility] Care Management   ASSESSMENT & PLAN:   Assessment: 1.  IgG lambda plasma cell myeloma: - Work-up for macrocytic anemia on 12/01/2020 showed M spike of 2.9 g.  Immunofixation IgG lambda. - Lambda light chains elevated at 284.  Light chain ratio was 0.04.  LDH was 163.  Creatinine was 0.9 and calcium 8.9. - Bone marrow biopsy on 12/18/2020-hypercellular marrow for age with 48% atypical plasma cells with lambda light chain restriction. - Chromosome analysis was normal. 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 - Revlimid 5 mg, 2 weeks on/1 week off started on 01/20/2021.  Dexamethasone weekly 10 mg added on 02/07/2021.  Revlimid is discontinued around 06/15/2021 due to poor tolerance and ineffectiveness at low-dose.  Thrombocytopenia and anemia. - Velcade weekly on days 1, 8, 15 every 21 days along with dexamethasone 10 mg weekly started on 06/25/2021.   2.  Macrocytic anemia: - CBC on 12/01/2020 with hemoglobin 8.5 and MCV of 117. - Denies any bleeding per rectum or melena.   3.  Social/family history: - Lives at home with his wife.  He does all ADLs and IADLs.  He even does yard work. - He worked at [employer] for 35 years.  Denies any chemical exposure.  Non-smoker. - No family history of malignancies.     Plan: 1.  IgG lambda plasma cell myeloma, high risk: - He was recently hospitalized and found to have leg  cellulitis and gram-negative bacteremia.  Port was discontinued. - He is currently at [facility].  Wife reports that he is walking with the help of walker.  His physical condition is improving.  However he is having confusion episodes.  He is not sleeping at night and sleeps mostly during daytime. - He reported pain on urination.  We have done a UA in the office which did not reveal any significant changes of UTI. - He also complained of scrotal swelling with pain.  He will be sent to the ER for further evaluation. - Labs today: Normal LFTs and creatinine.  CBC shows anemia, predominantly macrocytic. - Recommend rechecking CBC and myeloma labs in 2 weeks for possible transfusion.  Recommend follow-up in 4 weeks.  2.  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  No indication for transfusion.  3.  Ankle swelling: - Use Lasix as needed.  4.  ID prophylaxis: - Continue acyclovir twice daily and aspirin 81 mg 3 times weekly.    Orders Placed This Encounter  Procedures   Urine culture    Standing Status:   Future    Number of Occurrences:   1    Standing Expiration Date:   01/13/2024   Urinalysis, Routine w reflex microscopic    Standing Status:   Future    Number of Occurrences:   1    Standing Expiration Date:   01/13/2024      I,Katie Daubenspeck,acting as a scribe for Doreatha Massed, MD.,have documented all  relevant documentation on the behalf of Doreatha Massed, MD,as directed by  Doreatha Massed, MD while in the presence of Doreatha Massed, MD.   I, Doreatha Massed MD, have reviewed the above documentation for accuracy and completeness, and I agree with the above.   Doreatha Massed, MD   9/9/20244:21 PM  CHIEF COMPLAINT:   Diagnosis: multiple myeloma and anemia    Cancer Staging  No matching staging information was found for the patient.    Prior Therapy: revlimid  Current Therapy:  Weekly Velcade and  daratumumab    HISTORY OF PRESENT ILLNESS:   Oncology History  Multiple myeloma (HCC)  01/15/2021 Initial Diagnosis   Multiple myeloma (HCC)   06/25/2021 - 12/06/2021 Chemotherapy   Patient is on Treatment Plan : MYELOMA NON-TRANSPLANT CANDIDATES VRd weekly q21d     12/20/2021 -  Chemotherapy   Patient is on Treatment Plan : MYELOMA Daratumumab IV q28d        INTERVAL HISTORY:   Dale [name] is a 89 y.o. male presenting to clinic today for follow up of multiple myeloma and anemia. He was last seen by me on 12/26/22.  Today, he states that he is doing well overall. His appetite level is at 75%. His energy level is at 10%.  PAST MEDICAL HISTORY:   Past Medical History: Past Medical History:  Diagnosis Date   Allergic rhinitis    Anal fissure    Anginal pain (HCC)    Asthma    Cancer (HCC)    skin   Chest pain    CHF (congestive heart failure) (HCC)    Coronary heart disease    s/p stenting. cath in 01/2012 noncritical occlusion   Dyspnea    Dysrhythmia    1st degree heart block   GERD (gastroesophageal reflux disease)    Glaucoma    Hiatal hernia    Hyperlipidemia    Hypertension    Hypothyroidism    Idiopathic thrombocytopenic purpura (HCC) 2002   Macular degeneration    Multiple myeloma (HCC) 01/15/2021   Nephrolithiasis    PUD (peptic ulcer disease)    remote   Sarcoidosis    pulmonary   Schatzki's ring     Surgical History: Past Surgical History:  Procedure Laterality Date   cardiac stents     COLONOSCOPY  10/30/2006   Normal rectum, sigmoid diverticula.Remainder of colonic mucosa appeared normal.   CORONARY ANGIOPLASTY WITH STENT PLACEMENT     about 10 years ago per pt (around 2007)   CYSTOSCOPY WITH RETROGRADE PYELOGRAM, URETEROSCOPY AND STENT PLACEMENT Left 06/16/2017   Procedure: CYSTOSCOPY WITH RETROGRADE PYELOGRAM, URETEROSCOPY,STONE EXTRACTION  AND STENT PLACEMENT;  Surgeon: Marcine Matar, MD;  Location: [facility];  Service: Urology;  Laterality: Left;    ESOPHAGOGASTRODUODENOSCOPY  06/19/2004   Two esophageal rings and esophageal web as described above.  All of these were disrupted by passing 56-French Elease Hashimoto dilator/ Candida esophagitis,which appears to be incidental given history of   antibiotic use, but nevertheless will be treated.   ESOPHAGOGASTRODUODENOSCOPY  10/30/2006   Distal tandem esophageal ring status post dilation disruption as  described above.  Otherwise normal esophagus/  Small hiatal hernia otherwise normal stomach, D1 and D2   ESOPHAGOGASTRODUODENOSCOPY N/A 03/22/2015   Dr.Rourk- noncritical schatzki's ring and hiatal hernia-o/w normal EGD.    ESOPHAGOGASTRODUODENOSCOPY (EGD) WITH ESOPHAGEAL DILATION  03/04/2012   RMR- schatzki's ring, hiatal hernia, polypoid gastric mucosa, bx= minimally active gastritis.   HOLMIUM LASER APPLICATION Left 06/16/2017   Procedure: HOLMIUM LASER APPLICATION;  Surgeon:  Marcine Matar, MD;  Location: [facility];  Service: Urology;  Laterality: Left;   IR CV LINE INJECTION  12/27/2021   IR GENERIC HISTORICAL  03/06/2016   IR RADIOLOGIST EVAL & MGMT 03/06/2016 Irish Lack, MD [facility] INTERV RAD   IR GENERIC HISTORICAL  06/18/2016   IR RADIOLOGIST EVAL & MGMT 06/18/2016 Irish Lack, MD [facility] INTERV RAD   IR IMAGING GUIDED PORT INSERTION  12/18/2021   IR IMAGING GUIDED PORT INSERTION  01/15/2022   IR RADIOLOGIST EVAL & MGMT  10/01/2016   IR RADIOLOGIST EVAL & MGMT  10/15/2017   IR RADIOLOGIST EVAL & MGMT  12/24/2018   IR RADIOLOGIST EVAL & MGMT  01/04/2020   IR RADIOLOGIST EVAL & MGMT  06/20/2021   IR REMOVAL TUN ACCESS W/ PORT W/O FL MOD SED  01/15/2022   LEFT HEART CATH N/A 02/02/2012   Procedure: LEFT HEART CATH;  Surgeon: Runell Gess, MD;  Location: [facility] CATH LAB;  Service: Cardiovascular;  Laterality: N/A;   MEDIASTINOSCOPY     for dx sarcoid   PORT-A-CATH REMOVAL Left 12/20/2022   Procedure: MINOR REMOVAL PORT-A-CATH;  Surgeon: Lewie Chamber, DO;  Location: [facility];  Service:  General;  Laterality: Left;   RADIOLOGY WITH ANESTHESIA Left 05/17/2016   Procedure: left renal ablation;  Surgeon: Irish Lack, MD;  Location: [facility];  Service: Radiology;  Laterality: Left;    Social History: 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    Employer: RETIRED  Tobacco Use   Smoking status: Former    Current packs/day: 0.00    Average packs/day: 0.1 packs/day for 2.0 years (0.2 ttl pk-yrs)    Types: Cigarettes, Cigars    Start date: 05/06/1968    Quit date: 05/06/1970    Years since quitting: 52.7   Smokeless tobacco: Never  Vaping Use   Vaping status: Never Used  Substance and Sexual Activity   Alcohol use: No    Alcohol/week: 0.0 standard drinks of alcohol   Drug use: No   Sexual activity: Never  Other Topics Concern   Not on file  Social History Narrative   Not on file   Social Determinants of Health   Financial Resource Strain: Low Risk  (12/02/2022)   Overall Financial Resource Strain (CARDIA)    Difficulty of Paying Living Expenses: Not very hard  Food Insecurity: No Food Insecurity (12/15/2022)   Hunger Vital Sign    Worried About Running Out of Food in the Last Year: Never true    Ran Out of Food in the Last Year: Never true  Transportation Needs: No Transportation Needs (12/15/2022)   PRAPARE - Administrator, Civil Service (Medical): No    Lack of Transportation (Non-Medical): No  Physical Activity: Inactive (11/25/2022)   Exercise Vital Sign    Days of Exercise per Week: 0 days    Minutes of Exercise per Session: 0 min  Stress: No Stress Concern Present (10/16/2021)   Received from [facility] - Occupational Stress Questionnaire    Feeling of Stress : Not at  all  Social Connections: Unknown (09/18/2021)   Received from [facility]   Social Network  Social Network: Not on file  Intimate Partner Violence: Not At Risk (12/15/2022)   Humiliation, Afraid, Rape, and Kick questionnaire    Fear of Current or Ex-Partner: No    Emotionally Abused: No    Physically Abused: No    Sexually Abused: No    Family History: Family History  Problem Relation Age of Onset   Heart disease Father        deceased age 79   Stroke Mother    Alzheimer's disease Mother    Heart attack Brother        deceased age 59   Cancer Other        niece   Colon cancer Neg Hx     Current Medications: No current facility-administered medications for this visit.  Current Outpatient Medications:    acetaminophen (TYLENOL) 325 MG tablet, Take 2 tablets (650 mg total) by mouth every 6 (six) hours as needed for mild pain (or Fever >/= 101)., Disp: , Rfl:    acyclovir (ZOVIRAX) 400 MG tablet, TAKE 1 TABLET BY MOUTH TWICE  DAILY, Disp: 180 tablet, Rfl: 0   Ascorbic Acid (VITAMIN C PO), Take 500 mg by mouth every evening., Disp: , Rfl:    aspirin EC 81 MG tablet, Take 1 tablet (81 mg total) by mouth daily with breakfast. For 30 days Only (Patient taking differently: Take 81 mg by mouth daily with breakfast. Every Monday, Wednesday, and Friday), Disp: 30 tablet, Rfl: 0   atorvastatin (LIPITOR) 40 MG tablet, TAKE 1 TABLET BY MOUTH IN THE  EVENING (Patient taking differently: Take 40 mg by mouth every evening.), Disp: 90 tablet, Rfl: 1   Bortezomib (VELCADE IJ), Inject as directed once a week., Disp: , Rfl:    brimonidine (ALPHAGAN) 0.2 % ophthalmic solution, Place 1 drop into the left eye 2 (two) times daily., Disp: , Rfl:    cyanocobalamin 1000 MCG tablet, Take 1 tablet (1,000 mcg total) by mouth daily., Disp: 30 tablet, Rfl: 3   feeding supplement (ENSURE ENLIVE / ENSURE PLUS) LIQD, Take 237 mLs by mouth 3 (three) times daily between meals., Disp: , Rfl:    isosorbide  mononitrate (IMDUR) 60 MG 24 hr tablet, Take 1 tablet (60 mg total) by mouth in the morning and at bedtime., Disp: , Rfl:    levothyroxine (SYNTHROID) 75 MCG tablet, Take 75 mcg by mouth daily before breakfast., Disp: , Rfl:    lidocaine-prilocaine (EMLA) cream, Apply 1 Application topically as needed., Disp: 30 g, Rfl: 1   losartan (COZAAR) 50 MG tablet, Take 1 tablet (50 mg total) by mouth daily., Disp: , Rfl:    metoprolol tartrate (LOPRESSOR) 25 MG tablet, Take 1 tablet (25 mg total) by mouth 2 (two) times daily. TAKE 1 TABLET BY MOUTH IN  THE MORNING AND ONE-HALF  TABLET BY MOUTH IN THE  EVENING, Disp: , Rfl:    Multiple Vitamins-Minerals (PRESERVISION/LUTEIN) CAPS, Take 1 capsule by mouth 2 (two) times daily., Disp: , Rfl:    nitroGLYCERIN (NITROSTAT) 0.4 MG SL tablet, Place 1 tablet (0.4 mg total) under the tongue every 5 (five) minutes as needed. For chest pain, Disp: 25 tablet, Rfl: 2   pantoprazole (PROTONIX) 40 MG tablet, TAKE 1 TABLET BY MOUTH DAILY  BEFORE BREAKFAST (Patient taking differently: Take 40 mg by mouth daily.), Disp: 90 tablet, Rfl: 3   prochlorperazine (COMPAZINE) 10 MG tablet, Take 1 tablet (10 mg total) by mouth every 6 (six) hours as needed for nausea or vomiting., Disp: 30 tablet,  Rfl: 3   ROCKLATAN 0.02-0.005 % SOLN, Place 1 drop into the left eye at bedtime., Disp: , Rfl:    triamcinolone (KENALOG) 0.1 % cream, Apply 1 application. topically 2 (two) times daily as needed (for irritation)., Disp: , Rfl:    vitamin E 200 UNIT capsule, Take 200 Units by mouth every evening., Disp: , Rfl:   Facility-Administered Medications Ordered in Other Visits:    acetaminophen (TYLENOL) 325 MG tablet, , , ,    diphenhydrAMINE (BENADRYL) 25 mg capsule, , , ,    montelukast (SINGULAIR) 10 MG tablet, , , ,    Allergies: Allergies  Allergen Reactions   Azithromycin Other (See Comments)    Sore mouth and fever blisters around mouth, sores in nose area as well   Doxazosin Shortness  Of Breath   Atenolol Other (See Comments)    UNKNOWN REACTION   Hydrocodone Nausea And Vomiting   Levofloxacin Other (See Comments)    Caused stomach problems.   Morphine Other (See Comments)    "made me crazy"   Penicillins Nausea And Vomiting and Other (See Comments)    Has patient had a PCN reaction causing immediate rash, facial/tongue/throat swelling, SOB or lightheadedness with hypotension: No Has patient had a PCN reaction causing severe rash involving mucus membranes or skin necrosis: No Has patient had a PCN reaction that required hospitalization No Has patient had a PCN reaction occurring within the last 10 years: No If all of the above answers are "NO", then may proceed with Cephalosporin use.    Sulfonamide Derivatives Nausea And Vomiting   Soap Rash    REVIEW OF SYSTEMS:   Review of Systems  Constitutional:  Negative for chills, fatigue and fever.  HENT:   Negative for lump/mass, mouth sores, nosebleeds, sore throat and trouble swallowing.   Eyes:  Negative for eye problems.  Respiratory:  Positive for cough. Negative for shortness of breath.   Cardiovascular:  Negative for chest pain, leg swelling and palpitations.  Gastrointestinal:  Negative for abdominal pain, constipation, diarrhea, nausea and vomiting.  Genitourinary:  Positive for dysuria. Negative for bladder incontinence, difficulty urinating, frequency, hematuria and nocturia.   Musculoskeletal:  Negative for arthralgias, back pain, flank pain, myalgias and neck pain.  Skin:  Negative for itching and rash.  Neurological:  Negative for dizziness, headaches and numbness.  Hematological:  Does not bruise/bleed easily.  Psychiatric/Behavioral:  Negative for depression, sleep disturbance and suicidal ideas. The patient is not nervous/anxious.   All other systems reviewed and are negative.    VITALS:   Blood pressure (!) 131/55, pulse 71, temperature 98.5 F (36.9 C), temperature source Oral, resp. rate 16,  SpO2 100%.  Wt Readings from Last 3 Encounters:  12/23/22 117 lb 8.1 oz (53.3 kg)  12/12/22 126 lb 3.2 oz (57.2 kg)  11/28/22 129 lb 3.2 oz (58.6 kg)    There is no height or weight on file to calculate BMI.  Performance status (ECOG): 1 - Symptomatic but completely ambulatory  PHYSICAL EXAM:   Physical Exam Vitals and nursing note reviewed. Exam conducted with a chaperone present.  Constitutional:      Appearance: Normal appearance.  Cardiovascular:     Rate and Rhythm: Normal rate and regular rhythm.     Pulses: Normal pulses.     Heart sounds: Normal heart sounds.  Pulmonary:     Effort: Pulmonary effort is normal.     Breath sounds: Normal breath sounds.  Abdominal:     Palpations: Abdomen  is soft. There is no hepatomegaly, splenomegaly or mass.     Tenderness: There is no abdominal tenderness.  Musculoskeletal:     Right lower leg: No edema.     Left lower leg: No edema.  Lymphadenopathy:     Cervical: No cervical adenopathy.     Right cervical: No superficial, deep or posterior cervical adenopathy.    Left cervical: No superficial, deep or posterior cervical adenopathy.     Upper Body:     Right upper body: No supraclavicular or axillary adenopathy.     Left upper body: No supraclavicular or axillary adenopathy.  Neurological:     General: No focal deficit present.     Mental Status: He is alert and oriented to person, place, and time.  Psychiatric:        Mood and Affect: Mood normal.        Behavior: Behavior normal.     LABS:      Latest Ref Rng & Units 01/13/2023   12:30 PM 12/26/2022    9:33 AM 12/18/2022    4:12 AM  CBC  WBC 4.0 - 10.5 K/uL 7.6  8.5  4.9   Hemoglobin 13.0 - 17.0 g/dL 8.5  40.9  8.7   Hematocrit 39.0 - 52.0 % 25.3  30.5  26.7   Platelets 150 - 400 K/uL 170  244  78       Latest Ref Rng & Units 01/13/2023   12:30 PM 12/26/2022    9:33 AM 12/21/2022    4:25 AM  CMP  Glucose 70 - 99 mg/dL 96  811  94   BUN 8 - 23 mg/dL 18  35  11    Creatinine 0.61 - 1.24 mg/dL 9.14  7.82  9.56   Sodium 135 - 145 mmol/L 133  130  128   Potassium 3.5 - 5.1 mmol/L 3.7  4.1  3.0   Chloride 98 - 111 mmol/L 107  101  98   CO2 22 - 32 mmol/L 23  20  25    Calcium 8.9 - 10.3 mg/dL 9.0  9.2  8.3   Total Protein 6.5 - 8.1 g/dL 8.6  9.1    Total Bilirubin 0.3 - 1.2 mg/dL 0.5  0.4    Alkaline Phos 38 - 126 U/L 84  70    AST 15 - 41 U/L 20  20    ALT 0 - 44 U/L 23  28       No results found for: "CEA1", "CEA" / No results found for: "CEA1", "CEA" No results found for: "PSA1" No results found for: "CAN199" No results found for: "CAN125"  Lab Results  Component Value Date   TOTALPROTELP 8.1 12/12/2022   ALBUMINELP 3.3 12/12/2022   A1GS 0.2 12/12/2022   A2GS 0.8 12/12/2022   BETS 0.7 12/12/2022   GAMS 3.1 (H) 12/12/2022   MSPIKE 2.8 (H) 12/12/2022   SPEI Comment 12/12/2022   Lab Results  Component Value Date   TIBC 257 08/23/2021   TIBC 298 02/07/2021   TIBC 263 05/27/2017   FERRITIN 1,415 (H) 01/24/2022   FERRITIN 939 (H) 08/23/2021   FERRITIN 123 02/07/2021   IRONPCTSAT 90 (H) 08/23/2021   IRONPCTSAT 17 (L) 02/07/2021   IRONPCTSAT 44 05/27/2017   Lab Results  Component Value Date   LDH 115 05/01/2021   LDH 120 04/09/2021   LDH 124 04/02/2021     STUDIES:   MR ANKLE LEFT W WO CONTRAST  Result Date: 12/17/2022 CLINICAL  DATA:  Ankle pain and swelling with bacteremia. Evaluate for septic joint. Recent radiographs obtained for pain after falling. EXAM: MRI OF THE LEFT ANKLE WITHOUT AND WITH CONTRAST TECHNIQUE: Multiplanar, multisequence MR imaging of the ankle was performed before and after the administration of intravenous contrast. CONTRAST:  5mL GADAVIST GADOBUTROL 1 MMOL/ML IV SOLN COMPARISON:  Radiographs of the ankle and lower leg 12/15/2022. FINDINGS: TENDONS Peroneal: Mild longitudinal split tearing of the peroneus brevis tendon. The peroneal tendons are normally located, and there is no significant tenosynovitis or  abnormal synovial enhancement. Posteromedial: Intact and normally positioned. Anterior: Intact and normally positioned. Achilles: Intact. Plantar Fascia: Intact. LIGAMENTS Lateral: The anterior and posterior talofibular and calcaneofibular ligaments are intact.The inferior tibiofibular ligaments appear intact. Medial: The deltoid and visualized portions of the spring ligament appear intact. CARTILAGE AND BONES Ankle Joint: No significant ankle joint effusion or abnormal synovial enhancement. The talar dome and tibial plafond appear normal. No subchondral edema. Subtalar Joints/Sinus Tarsi: No subtalar joint effusion, abnormal enhancement or subchondral edema. The tarsal sinus appears normal. Bones: No evidence of acute fracture, dislocation or osteomyelitis. No significant hindfoot or midfoot arthropathy. Other: Nonspecific generalized subcutaneous edema surrounding the ankle and extending into the dorsal aspect of the midfoot. No focal fluid collection or abnormal enhancement identified. No evidence of foreign body. IMPRESSION: IMPRESSION 1. Nonspecific generalized subcutaneous edema surrounding the ankle and extending into the dorsal aspect of the midfoot. No focal fluid collection or abnormal enhancement identified. 2. No evidence of septic arthritis or osteomyelitis. 3. Longitudinal split tear of the peroneus brevis tendon. The additional ankle tendons and ligaments appear intact. Electronically Signed   By: Carey Bullocks M.D.   On: 12/17/2022 08:18   CT ABDOMEN PELVIS W CONTRAST  Result Date: 12/15/2022 CLINICAL DATA:  Abdominal pain, acute, nonlocalized EXAM: CT ABDOMEN AND PELVIS WITH CONTRAST TECHNIQUE: Multidetector CT imaging of the abdomen and pelvis was performed using the standard protocol following bolus administration of intravenous contrast. RADIATION DOSE REDUCTION: This exam was performed according to the departmental dose-optimization program which includes automated exposure control,  adjustment of the mA and/or kV according to patient size and/or use of iterative reconstruction technique. CONTRAST:  OMNIPAQUE IOHEXOL 300 MG/ML  SOLN COMPARISON:  11/17/2022 FINDINGS: Lower chest: Mass-like opacity at the periphery of the left lower lobe measures approximately 4.6 x 1.3 x 2.6 cm, previously measured approximately 3.9 x 2.0 x 2.6 cm. Mild adjacent ground-glass opacity has slightly improved. Heart size is normal. Coronary artery atherosclerosis. Hepatobiliary: Unchanged appearance of the liver including prominent left hepatic lobe cyst. No new focal liver abnormality. Unremarkable gallbladder. No hyperdense gallstone. Similar mild biliary dilatation. Pancreas: Unremarkable. No pancreatic ductal dilatation or surrounding inflammatory changes. Spleen: Normal in size without focal abnormality. Adrenals/Urinary Tract: Unremarkable adrenal glands. Kidneys enhance symmetrically without focal lesion, stone, or hydronephrosis. Ureters are nondilated. There is a anterior urinary bladder wall diverticulum. Stomach/Bowel: Stomach within normal limits. No abnormally dilated loops of bowel. Mild wall thickening of the proximal to mid transverse colon. Extensive sigmoid diverticulosis. Moderate volume of stool within the colon. Vascular/Lymphatic: Aortic atherosclerosis. No enlarged abdominal or pelvic lymph nodes. Reproductive: Prostate is unremarkable. Other: No free fluid. No abdominopelvic fluid collection. No pneumoperitoneum. Small fat containing right inguinal hernia. Musculoskeletal: No acute or significant osseous findings. IMPRESSION: 1. Mild wall thickening of the proximal to mid transverse colon, which may be secondary to underdistention or mild colitis. 2. Extensive sigmoid diverticulosis without evidence of acute diverticulitis. 3. Mass-like opacity at the  periphery of the left lower lobe measures approximately 4.6 x 1.3 x 2.6 cm, previously measured approximately 3.9 x 2.0 x 2.6 cm. Consider  one of the following in 3 months for both low-risk and high-risk individuals: (a) repeat chest CT, (b) follow-up PET-CT, or (c) tissue sampling. This recommendation follows the consensus statement: Guidelines for Management of Incidental Pulmonary Nodules Detected on CT Images: From the Fleischner Society 2017; Radiology 2017; 284:228-243. 4. Aortic and coronary artery atherosclerosis (ICD10-I70.0). Electronically Signed   By: Duanne Guess D.O.   On: 12/15/2022 15:40   CT Head Wo Contrast  Result Date: 12/15/2022 CLINICAL DATA:  Mental status change, unknown cause EXAM: CT HEAD WITHOUT CONTRAST TECHNIQUE: Contiguous axial images were obtained from the base of the skull through the vertex without intravenous contrast. RADIATION DOSE REDUCTION: This exam was performed according to the departmental dose-optimization program which includes automated exposure control, adjustment of the mA and/or kV according to patient size and/or use of iterative reconstruction technique. COMPARISON:  10/07/2022 FINDINGS: Brain: No evidence of acute infarction, hemorrhage, hydrocephalus, extra-axial collection or mass lesion/mass effect. Patchy low-density changes within the periventricular and subcortical white matter most compatible with chronic microvascular ischemic change. Mild diffuse cerebral volume loss. Vascular: Atherosclerotic calcifications involving the large vessels of the skull base. No unexpected hyperdense vessel. Skull: Normal. Negative for fracture or focal lesion. Sinuses/Orbits: Chronic left paranasal sinus mucosal thickening. Scattered opacification within the left ethmoid air cells. Other: None. IMPRESSION: 1. No acute intracranial findings. 2. Chronic microvascular ischemic change and cerebral volume loss. Electronically Signed   By: Duanne Guess D.O.   On: 12/15/2022 15:29   DG Ankle Complete Left  Result Date: 12/15/2022 CLINICAL DATA:  89 year old male with history of trauma from a fall  complaining of pain. EXAM: LEFT ANKLE COMPLETE - 3+ VIEW COMPARISON:  None Available. FINDINGS: There is no evidence of fracture, dislocation, or joint effusion. Multifocal joint space narrowing, subchondral sclerosis and osteophyte formation noted in the tibiotalar joint and throughout the visualized portions of the midfoot and hindfoot, indicative of osteoarthritis. Numerous vascular calcifications. Mild soft tissue swelling around the ankle, most evident overlying the lateral malleolus. IMPRESSION: 1. Mild soft tissue swelling, most evident overlying the lateral malleolus. No underlying acute osseous abnormality. 2. Degenerative changes of osteoarthritis, as above. 3. Atherosclerosis. Electronically Signed   By: Trudie Reed M.D.   On: 12/15/2022 12:38   DG Chest 1 View  Result Date: 12/15/2022 CLINICAL DATA:  89 year old male with history of trauma from a fall. Chest pain. EXAM: CHEST  1 VIEW COMPARISON:  Chest x-ray 11/17/2022. FINDINGS: Left-sided single-lumen Port-A-Cath with tip terminating near the cavoatrial junction. Lung volumes are slightly low. Diffuse interstitial prominence and widespread peribronchial cuffing with an overall reticulonodular appearance to the lungs which is very similar to the prior study. No new acute consolidative airspace disease. No pleural effusions. No pneumothorax. No evidence of pulmonary edema. Heart size is normal. Upper mediastinal contours are within normal limits. IMPRESSION: 1. No acute findings in the thorax. 2. Chronic parenchymal changes in the lungs suggestive of chronic bronchitis and probable multifocal chronic postinfectious or inflammatory scarring, similar to prior studies, as above. 3. Aortic atherosclerosis. Electronically Signed   By: Trudie Reed M.D.   On: 12/15/2022 12:26   DG Tibia/Fibula Left  Result Date: 12/15/2022 CLINICAL DATA:  89 year old male with history of trauma from a fall. Left leg pain. EXAM: LEFT TIBIA AND FIBULA - 2 VIEW  COMPARISON:  No priors. FINDINGS: Four views  of the left leg demonstrate no acute displaced fracture of the tibia or fibula. Numerous vascular calcifications are noted. IMPRESSION: 1. No acute radiographic abnormality of the left tibia or fibula. 2. Atherosclerosis. Electronically Signed   By: Trudie Reed M.D.   On: 12/15/2022 12:22   DG Ankle Complete Right  Result Date: 12/15/2022 CLINICAL DATA:  89 year old male with history of trauma from a fall complaining of left ankle pain. EXAM: RIGHT ANKLE - COMPLETE 3+ VIEW COMPARISON:  No priors. FINDINGS: There is no evidence of fracture, dislocation, or joint effusion. Degenerative changes of osteoarthritis are noted at the tibiotalar joint and throughout the visualized portions of the midfoot and hindfoot. Numerous vascular calcifications are noted. Soft tissues are otherwise unremarkable. IMPRESSION: 1. No acute radiographic abnormality of the right ankle. 2. Osteoarthritis, as above. 3. Atherosclerosis. Electronically Signed   By: Trudie Reed M.D.   On: 12/15/2022 12:22

## 2023-01-13 NOTE — ED Provider Notes (Signed)
  Physical Exam  BP (!) 155/92   Pulse 81   Temp 98.3 F (36.8 C) (Oral)   Resp 18   SpO2 100%   Physical Exam  Procedures  Procedures  ED Course / MDM   Clinical Course as of 01/13/23 1928  Mon Jan 13, 2023  1803 Re-assessed, patient reports no longer in pain after medications. [JB]    Clinical Course User Index [JB] Barrett, Horald Chestnut, PA-C   Medical Decision Making Amount and/or Complexity of Data Reviewed Radiology: ordered.  Risk OTC drugs. Prescription drug management.   Wardell Honour, assumed care for this patient.  In brief this is a 89 year old male who is here for scrotal pain.  Patient was signed out pending testicular ultrasound.  Patient's testicular ultrasound shows likely epididymal cyst.  I personally examined the patient, there is mild erythema on the scrotum, no evidence of cellulitis.  May be a developing candidiasis.  No evidence of torsion.  Will send the patient with some analgesia.  Will provide urology follow-up.      Anders Simmonds T, DO 01/13/23 1932

## 2023-01-13 NOTE — ED Triage Notes (Signed)
Pt sent by [facility], for evaluation of scrotal swelling and pain controlled, currently being treated for multiple myeloma .

## 2023-01-13 NOTE — Discharge Instructions (Addendum)
Your ultrasound showed that you have a cyst on your epididymis, which is part of your testicle.  Sometimes this can cause some discomfort.  I am sending you some pain medication.  In the meantime, I recommend wearing supportive underwear.  You can also apply some nystatin powder which can help prevent fungal infections of the scrotum.  Included in your discharge paperwork is a telephone number for urologist.  Please give them a call tomorrow to set up an appointment.

## 2023-01-13 NOTE — Patient Instructions (Signed)
[facility] at [facility] Discharge Instructions   You were seen and examined today by Dr. Ellin Saba.  He reviewed the results of your lab work which are normal/stable.   We will obtain a urine specimen today to check for infection since you are having pain in your testicles and having episodes of confusion.   We will check your blood work in 2 weeks. We will give you blood if needed at that time.   We will see you back in 4 weeks.   Return as scheduled.    Thank you for choosing [facility] at [facility] to provide your oncology and hematology care.  To afford each patient quality time with our provider, please arrive at least 15 minutes before your scheduled appointment time.   If you have a lab appointment with the Cancer Center please come in thru the Main Entrance and check in at the main information desk.  You need to re-schedule your appointment should you arrive 10 or more minutes late.  We strive to give you quality time with our providers, and arriving late affects you and other patients whose appointments are after yours.  Also, if you no show three or more times for appointments you may be dismissed from the clinic at the providers discretion.     Again, thank you for choosing [facility].  Our hope is that these requests will decrease the amount of time that you wait before being seen by our physicians.       _____________________________________________________________  Should you have questions after your visit to [facility], please contact our office at [phone] and follow the prompts.  Our office hours are 8:00 a.m. and 4:30 p.m. Monday - Friday.  Please note that voicemails left after 4:00 p.m. may not be returned until the following business day.  We are closed weekends and major holidays.  You do have access to a nurse 24-7, just call the main number to the clinic [phone] and do not press  any options, hold on the line and a nurse will answer the phone.    For prescription refill requests, have your pharmacy contact our office and allow 72 hours.    Due to Covid, you will need to wear a mask upon entering the hospital. If you do not have a mask, a mask will be given to you at the Main Entrance upon arrival. For doctor visits, patients may have 1 support person age 87 or older with them. For treatment visits, patients can not have anyone with them due to social distancing guidelines and our immunocompromised population.

## 2023-01-13 NOTE — ED Provider Notes (Signed)
[place] EMERGENCY DEPARTMENT AT [facility] Provider Note   CSN: [id number] Arrival date & time: 01/13/23  1412     History  Chief Complaint  Patient presents with   Groin Swelling    Dale [name] is a 89 y.o. male w/ pmhx CAD, HTN, GERD, multiple myeloma, BPH reporting for approximately 1 day of scrotal swelling and pain.  Patient describes the pain as severe, intermittent, dull ache. Pt reports he has had itching rash for the past 2ds prior to unset of pain. Wife is reporting 1wk ago patient had generalized rash from reaction to body wash that caused itching and thinks the groin swelling and pain may be from that. UA and labs done at [facility] - normal UA, no WBC count, and otherwise unremarkable. Patient is requesting pain medication. No chills, fever, abd pain, CP, or SOB.   HPI     Home Medications Prior to Admission medications   Medication Sig Start Date End Date Taking? Authorizing Provider  acetaminophen (TYLENOL) 325 MG tablet Take 2 tablets (650 mg total) by mouth every 6 (six) hours as needed for mild pain (or Fever >/= 101). 01/17/22   Vassie Loll, MD  acyclovir (ZOVIRAX) 400 MG tablet TAKE 1 TABLET BY MOUTH TWICE  DAILY 12/02/22   Doreatha Massed, MD  Ascorbic Acid (VITAMIN C PO) Take 500 mg by mouth every evening.    [provider]  aspirin EC 81 MG tablet Take 1 tablet (81 mg total) by mouth daily with breakfast. For 30 days Only Patient taking differently: Take 81 mg by mouth daily with breakfast. Every Monday, Wednesday, and Friday 06/04/22 06/04/23  Shon Hale, MD  atorvastatin (LIPITOR) 40 MG tablet TAKE 1 TABLET BY MOUTH IN THE  EVENING Patient taking differently: Take 40 mg by mouth every evening. 09/06/22   Lennette Bihari, MD  Bortezomib (VELCADE IJ) Inject as directed once a week.    [provider]  brimonidine (ALPHAGAN) 0.2 % ophthalmic solution Place 1 drop into the left eye 2 (two) times daily.    [provider]  cyanocobalamin 1000 MCG tablet Take 1 tablet (1,000 mcg total) by mouth daily. 01/18/22   Vassie Loll, MD  feeding supplement (ENSURE ENLIVE / ENSURE PLUS) LIQD Take 237 mLs by mouth 3 (three) times daily between meals. 01/17/22   Vassie Loll, MD  isosorbide mononitrate (IMDUR) 60 MG 24 hr tablet Take 1 tablet (60 mg total) by mouth in the morning and at bedtime. 12/23/22   Lewie Chamber, MD  levothyroxine (SYNTHROID) 75 MCG tablet Take 75 mcg by mouth daily before breakfast. 07/16/21   [provider]  lidocaine-prilocaine (EMLA) cream Apply 1 Application topically as needed. 12/20/21   Doreatha Massed, MD  losartan (COZAAR) 50 MG tablet Take 1 tablet (50 mg total) by mouth daily. 12/23/22   Lewie Chamber, MD  metoprolol tartrate (LOPRESSOR) 25 MG tablet Take 1 tablet (25 mg total) by mouth 2 (two) times daily. TAKE 1 TABLET BY MOUTH IN  THE MORNING AND ONE-HALF  TABLET BY MOUTH IN THE  EVENING 12/23/22   Lewie Chamber, MD  Multiple Vitamins-Minerals (PRESERVISION/LUTEIN) CAPS Take 1 capsule by mouth 2 (two) times daily.    [provider]  nitroGLYCERIN (NITROSTAT) 0.4 MG SL tablet Place 1 tablet (0.4 mg total) under the tongue every 5 (five) minutes as needed. For chest pain 02/15/22   Lennette Bihari, MD  pantoprazole (PROTONIX) 40 MG tablet TAKE 1 TABLET BY  MOUTH DAILY  BEFORE BREAKFAST Patient taking differently: Take 40 mg by mouth daily. 01/28/22   Aida Raider, NP  prochlorperazine (COMPAZINE) 10 MG tablet Take 1 tablet (10 mg total) by mouth every 6 (six) hours as needed for nausea or vomiting. 12/20/21   Doreatha Massed, MD  ROCKLATAN 0.02-0.005 % SOLN Place 1 drop into the left eye at bedtime. 12/21/19   [provider]  triamcinolone (KENALOG) 0.1 % cream Apply 1 application. topically 2 (two) times daily as needed (for irritation).    [provider]  vitamin E 200 UNIT capsule Take 200 Units by mouth every evening.     [provider]      Allergies    Azithromycin, Doxazosin, Atenolol, Hydrocodone, Levofloxacin, Morphine, Penicillins, Sulfonamide derivatives, and Soap    Review of Systems   Review of Systems  Genitourinary:  Positive for dysuria, scrotal swelling and testicular pain. Negative for hematuria, penile discharge, penile pain and penile swelling.  Skin:  Positive for rash.    Physical Exam Updated Vital Signs BP (!) 155/70   Pulse 84   Temp 98.3 F (36.8 C) (Oral)   Resp 18   SpO2 92%  Physical Exam Vitals and nursing note reviewed.  Constitutional:      General: He is not in acute distress.    Appearance: He is not toxic-appearing.  HENT:     Head: Normocephalic and atraumatic.  Eyes:     General: No scleral icterus.    Conjunctiva/sclera: Conjunctivae normal.  Cardiovascular:     Rate and Rhythm: Normal rate and regular rhythm.     Pulses: Normal pulses.     Heart sounds: Normal heart sounds.  Pulmonary:     Effort: Pulmonary effort is normal. No respiratory distress.     Breath sounds: Normal breath sounds.  Abdominal:     General: Abdomen is flat. Bowel sounds are normal.     Palpations: Abdomen is soft.     Tenderness: There is no abdominal tenderness.  Skin:    General: Skin is warm and dry.     Findings: Erythema present. No lesion.     Comments: B/l scrotal errythema and swelling.   Neurological:     General: No focal deficit present.     Mental Status: He is alert and oriented to person, place, and time. Mental status is at baseline.     ED Results / Procedures / Treatments   Labs (all labs ordered are listed, but only abnormal results are displayed) Labs Reviewed - No data to display   EKG None  Radiology No results found.  Procedures Procedures    Medications Ordered in ED Medications  acetaminophen (TYLENOL) tablet 650 mg (650 mg Oral Not Given 01/13/23 1708)  fentaNYL (SUBLIMAZE) injection 50 mcg (50 mcg Intravenous Given 01/13/23  1705)    ED Course/ Medical Decision Making/ A&P Clinical Course as of 01/13/23 1826  Mon Jan 13, 2023  1803 Re-assessed, patient reports no longer in pain after medications. [JB]    Clinical Course User Index [JB] Cyd Hostler, Horald Chestnut, PA-C                                 Medical Decision Making Amount and/or Complexity of Data Reviewed Labs: ordered. Radiology: ordered.  Risk OTC drugs. Prescription drug management.   This patient presents to the ED for concern of scrotal pain, this involves an extensive number of  treatment options, and is a complaint that carries with it a high risk of complications and morbidity.  The differential diagnosis includes necrotizing fasciitis, testicular torsion, cellulitis, epididymitis, orchitis, UTI   Co morbidities that complicate the patient evaluation  CAD, HLD, CHF, asthma, multiple myeloma, HTN    Additional history obtained:  Additional history obtained from chart review and wife at bedside with patient  External records from outside source obtained and reviewed including [facility] visit from today    Lab Tests:  Review UA, CBC, BMP from    Imaging Studies ordered:  I ordered imaging studies including Testicular ultrasound with doppler  I independently visualized and interpreted imaging which showed PENDING I agree with the radiologist interpretation   Cardiac Monitoring: / EKG:  Vitals - no fever, hemodynamically stable, no WBC count    Consultations Obtained:  None obtained d/t normal US findings    Problem List / ED Course / Critical interventions / Medication management  Patient reporting for bilateral scrotal and pain that has been ongoing since last night.  Patient cannot describe whether or not the pain started suddenly, he reports its bilateral and were associated with some mild dysuria.  Patient does not appear systemically ill and erythema has not rapidly progressed. I ordered medication including Fentanyl   for pain  Reevaluation of the patient after these medicines showed that the patient improved I have reviewed the patients home medicines and have made adjustments as needed   Plan Awaiting [place] passed off to Dr Andria Meuse     Send home with PO antibiotics for epididymitis - Doxycycline  Patient is stable for discharge as long as normal [place] Follow up with primary care to ensure resolution of symptoms.          Final Clinical Impression(s) / ED Diagnoses Final diagnoses:  None    Rx / DC Orders ED Discharge Orders     None         Reinaldo Raddle 01/13/23 Earlean Polka, MD 01/16/23 2225

## 2023-01-14 LAB — URINE CULTURE: Culture: NO GROWTH

## 2023-01-15 ENCOUNTER — Other Ambulatory Visit: Payer: Self-pay

## 2023-01-15 DIAGNOSIS — E785 Hyperlipidemia, unspecified: Secondary | ICD-10-CM | POA: Diagnosis not present

## 2023-01-15 DIAGNOSIS — I2510 Atherosclerotic heart disease of native coronary artery without angina pectoris: Secondary | ICD-10-CM | POA: Diagnosis not present

## 2023-01-15 DIAGNOSIS — I10 Essential (primary) hypertension: Secondary | ICD-10-CM | POA: Diagnosis not present

## 2023-01-15 DIAGNOSIS — L509 Urticaria, unspecified: Secondary | ICD-10-CM | POA: Diagnosis not present

## 2023-01-15 DIAGNOSIS — I509 Heart failure, unspecified: Secondary | ICD-10-CM | POA: Diagnosis not present

## 2023-01-15 DIAGNOSIS — M6281 Muscle weakness (generalized): Secondary | ICD-10-CM | POA: Diagnosis not present

## 2023-01-15 DIAGNOSIS — E039 Hypothyroidism, unspecified: Secondary | ICD-10-CM | POA: Diagnosis not present

## 2023-01-15 DIAGNOSIS — C9000 Multiple myeloma not having achieved remission: Secondary | ICD-10-CM | POA: Diagnosis not present

## 2023-01-16 ENCOUNTER — Inpatient Hospital Stay: Payer: No Typology Code available for payment source

## 2023-01-17 DIAGNOSIS — I2510 Atherosclerotic heart disease of native coronary artery without angina pectoris: Secondary | ICD-10-CM | POA: Diagnosis not present

## 2023-01-17 DIAGNOSIS — I10 Essential (primary) hypertension: Secondary | ICD-10-CM | POA: Diagnosis not present

## 2023-01-17 DIAGNOSIS — I509 Heart failure, unspecified: Secondary | ICD-10-CM | POA: Diagnosis not present

## 2023-01-17 DIAGNOSIS — E785 Hyperlipidemia, unspecified: Secondary | ICD-10-CM | POA: Diagnosis not present

## 2023-01-17 DIAGNOSIS — L509 Urticaria, unspecified: Secondary | ICD-10-CM | POA: Diagnosis not present

## 2023-01-17 DIAGNOSIS — M6281 Muscle weakness (generalized): Secondary | ICD-10-CM | POA: Diagnosis not present

## 2023-01-17 DIAGNOSIS — C9000 Multiple myeloma not having achieved remission: Secondary | ICD-10-CM | POA: Diagnosis not present

## 2023-01-17 DIAGNOSIS — E039 Hypothyroidism, unspecified: Secondary | ICD-10-CM | POA: Diagnosis not present

## 2023-01-20 DIAGNOSIS — R41841 Cognitive communication deficit: Secondary | ICD-10-CM | POA: Diagnosis not present

## 2023-01-20 DIAGNOSIS — H01119 Allergic dermatitis of unspecified eye, unspecified eyelid: Secondary | ICD-10-CM | POA: Diagnosis not present

## 2023-01-20 DIAGNOSIS — I509 Heart failure, unspecified: Secondary | ICD-10-CM | POA: Diagnosis not present

## 2023-01-20 DIAGNOSIS — Z515 Encounter for palliative care: Secondary | ICD-10-CM | POA: Diagnosis not present

## 2023-01-20 DIAGNOSIS — C9000 Multiple myeloma not having achieved remission: Secondary | ICD-10-CM | POA: Diagnosis not present

## 2023-01-22 ENCOUNTER — Encounter: Payer: Self-pay | Admitting: Physician Assistant

## 2023-01-22 ENCOUNTER — Ambulatory Visit (INDEPENDENT_AMBULATORY_CARE_PROVIDER_SITE_OTHER): Payer: Medicare Other | Admitting: Physician Assistant

## 2023-01-22 VITALS — Resp 20 | Ht 68.0 in

## 2023-01-22 DIAGNOSIS — L509 Urticaria, unspecified: Secondary | ICD-10-CM | POA: Diagnosis not present

## 2023-01-22 DIAGNOSIS — H5789 Other specified disorders of eye and adnexa: Secondary | ICD-10-CM | POA: Diagnosis not present

## 2023-01-22 DIAGNOSIS — M6281 Muscle weakness (generalized): Secondary | ICD-10-CM | POA: Diagnosis not present

## 2023-01-22 DIAGNOSIS — I2510 Atherosclerotic heart disease of native coronary artery without angina pectoris: Secondary | ICD-10-CM | POA: Diagnosis not present

## 2023-01-22 DIAGNOSIS — E039 Hypothyroidism, unspecified: Secondary | ICD-10-CM | POA: Diagnosis not present

## 2023-01-22 DIAGNOSIS — F03918 Unspecified dementia, unspecified severity, with other behavioral disturbance: Secondary | ICD-10-CM

## 2023-01-22 DIAGNOSIS — C9000 Multiple myeloma not having achieved remission: Secondary | ICD-10-CM | POA: Diagnosis not present

## 2023-01-22 DIAGNOSIS — R600 Localized edema: Secondary | ICD-10-CM | POA: Diagnosis not present

## 2023-01-22 DIAGNOSIS — I509 Heart failure, unspecified: Secondary | ICD-10-CM | POA: Diagnosis not present

## 2023-01-22 NOTE — Progress Notes (Signed)
Assessment/Plan:     Dale [name] is a very pleasant 89 y.o. year old RH male with a history of hypertension, hyperlipidemia, CAD, hypothyroidism, CHF, GERD with dysphagia macular degeneration ACD, multiple myeloma chronic hyponatremia (IV Velcade and Darzalex), history of sarcoidosis history of sepsis due to LLL infectious pneumonia in July 2024, history of LLL extremity and ankle, as well as acute metabolic encephalopathy, August 2024, seen today for evaluation of memory loss.  MoCA and MMSE have been unable to be performed.  Recent CT of the head without contrast is remarkable for chronic microvascular ischemic changes and cerebral volume loss, atrophy.  Findings are concerning for dementia of multiple etiologies, including a concern for Alzheimer's disease.  His wife is concerned because currently, he is at rehab.  He is receiving physical therapy for strength and balance, however she has not wished to have her husband at home once he finishes rehab.  She states that she will be unable to care for him, and would prefer to having in the memory care versus ALF facility.  She will discuss with social workers regarding this issue.  Discussed with patient's wife the possibility of palliative care, which along with social workers, may help in the process.  Information of [facility] was given, she will contact them.    Dementia of multiple etiologies, concern for Alzheimer's disease, late onset  No antidementia medication is indicated, given his age and chronic medical issues, as the risk outweigh the benefits of it. Check B12, TSH Recommend good control of cardiovascular risk factors.   Continue to control mood as per PCP Patient's wife to contact her social workers at the rehab for possible placement in memory care, and for information about palliative care. Folllow up in 3 months  Subjective:    The patient is accompanied by his wife who supplements the history.    How long did patient  have memory difficulties?  For the last 2 years.  Patient has difficulty remembering new information, recent conversations and names of people per wife report. Has decreased comprehension. He was recently hospitalized for sepsis and was complicated with hospital psychosis. Since discharge he is at [facility] repeats oneself?  Denies  Disoriented when walking into a room?  Denies when at home, but at the rehab, he thinks the facility that he is in, is at recent leaving objects in unusual places?  He lives collecting paper towels in his pocket and  playing with shoestrings.  Wandering behavior? Denies.   Any personality changes, or depression, anxiety?  Has moments of irritability. Hallucinations or paranoia? Seizures? Denies.    Any sleep changes?  Sleeps well, sometimes he has vivid dreams, REM behavior or sleepwalking.   Sleep apnea? Denies.   Any hygiene concerns?  Denies.   Independent of bathing and dressing?Denies needing assistance getting dressed.  Who is in charge of the medications? Son is in charge  Who is in charge of the finances? Son  is in charge    Any changes in appetite?   Denies.     Patient have trouble swallowing?  He uses ice cream to swallow pills Does the patient cook?  No   Any headaches?  Denies.   Chronic back pain?  Denies.   Ambulates with difficulty? Needs a walker to ambulate, but today he is on wheelchair, because he is at fall risk      Recent falls or head injuries? Denies.     Vision changes? Denies. Stroke  like symptoms?  Denies.   Any tremors?  Denies.   Any anosmia?  Denies.   Any incontinence of urine?  Endorsed, wears pull ups  Any bowel dysfunction? Sometimes some diarrhea  Patient lives  with his wife History of heavy alcohol intake? Denies.   History of heavy tobacco use? Denies.   Family history of dementia?   One sister with AD, one living with dementia ?type Does patient drive?No longer drives  Allergies  Allergen Reactions    Azithromycin Other (See Comments)    Sore mouth and fever blisters around mouth, sores in nose area as well   Doxazosin Shortness Of Breath   Atenolol Other (See Comments)    UNKNOWN REACTION   Hydrocodone Nausea And Vomiting   Levofloxacin Other (See Comments)    Caused stomach problems.   Morphine Other (See Comments)    "made me crazy"   Penicillins Nausea And Vomiting and Other (See Comments)    Has patient had a PCN reaction causing immediate rash, facial/tongue/throat swelling, SOB or lightheadedness with hypotension: No Has patient had a PCN reaction causing severe rash involving mucus membranes or skin necrosis: No Has patient had a PCN reaction that required hospitalization No Has patient had a PCN reaction occurring within the last 10 years: No If all of the above answers are "NO", then may proceed with Cephalosporin use.    Sulfonamide Derivatives Nausea And Vomiting   Soap Rash    Current Outpatient Medications  Medication Instructions   acetaminophen (TYLENOL) 650 mg, Oral, Every 6 hours PRN   acyclovir (ZOVIRAX) 400 mg, Oral, 2 times daily   Ascorbic Acid (VITAMIN C PO) 500 mg, Oral, Every evening   aspirin EC 81 mg, Oral, Daily with breakfast, For 30 days Only   atorvastatin (LIPITOR) 40 MG tablet TAKE 1 TABLET BY MOUTH IN THE  EVENING   Bortezomib (VELCADE IJ) Injection, Weekly   brimonidine (ALPHAGAN) 0.2 % ophthalmic solution 1 drop, Left Eye, 2 times daily   cyanocobalamin 1,000 mcg, Oral, Daily   feeding supplement (ENSURE ENLIVE / ENSURE PLUS) LIQD 237 mLs, Oral, 3 times daily between meals   isosorbide mononitrate (IMDUR) 60 mg, Oral, 2 times daily   levothyroxine (SYNTHROID) 75 mcg, Oral, Daily before breakfast   lidocaine-prilocaine (EMLA) cream 1 Application, Topical, As needed   losartan (COZAAR) 50 mg, Oral, Daily   metoprolol tartrate (LOPRESSOR) 25 mg, Oral, 2 times daily, TAKE 1 TABLET BY MOUTH IN  THE MORNING AND ONE-HALF  TABLET BY MOUTH IN THE   EVENING   Multiple Vitamins-Minerals (PRESERVISION/LUTEIN) CAPS 1 capsule, Oral, 2 times daily   nitroGLYCERIN (NITROSTAT) 0.4 mg, Sublingual, Every 5 min PRN, For chest pain   nystatin (MYCOSTATIN/NYSTOP) powder 1 Application, Topical, 3 times daily   oxyCODONE (ROXICODONE) 5 mg, Oral, Every 6 hours PRN   pantoprazole (PROTONIX) 40 MG tablet TAKE 1 TABLET BY MOUTH DAILY  BEFORE BREAKFAST   prochlorperazine (COMPAZINE) 10 mg, Oral, Every 6 hours PRN   ROCKLATAN 0.02-0.005 % SOLN 1 drop, Left Eye, Daily at bedtime   triamcinolone (KENALOG) 0.1 % cream 1 application , Topical, 2 times daily PRN,     vitamin E 200 Units, Oral, Every evening     VITALS:   Vitals:   01/22/23 1314  Resp: 20  Height: 5\' 8"  (1.727 m)       No data to display          PHYSICAL EXAM   HEENT:  Normocephalic, atraumatic. The mucous  membranes are moist. The superficial temporal arteries are without ropiness or tenderness. Cardiovascular: Regular rate and rhythm. Lungs: Clear to auscultation bilaterally. Neck: There are no carotid bruits noted bilaterally.  NEUROLOGICAL:     No data to display              No data to display           Orientation:  Alert and oriented to person, not to place and time. No aphasia or dysarthria. Fund of knowledge is reduced. Recent and remote memory impaired.  Attention and concentration are reduced.  Able to name objects and unable to repeat phrases. Delayed recall    Cranial nerves: There is good facial symmetry. Extraocular muscles are intact and visual fields are full to confrontational testing. Speech is not very fluent, but clear no tongue deviation. Hearing is reduced to conversational tone.   Tone: Tone is good throughout. Sensation: Sensation is intact to light touch and pinprick throughout. Vibration is intact at the bilateral big toe. Coordination: The patient has some difficulty with RAM's or FNF bilaterally. Normal finger to nose if he is to do it  simultaneously with both hands Motor: Strength is 5/5 in the bilateral upper and lower extremities. There is no pronator drift. There are no fasciculations noted. DTR's: Deep tendon reflexes are 2/4 .  Plantar responses are downgoing bilaterally. Gait and Station: The patient is on a wheelchair due to significant deconditioning, with difficulty to standing up, gait has not been tested.   Thank you for allowing [place] the opportunity to participate in the care of this nice patient. Please do not hesitate to contact us for any questions or concerns.   Total time spent on today's visit was 60 minutes dedicated to this patient today, preparing to see patient, examining the patient, ordering tests and/or medications and counseling the patient, documenting clinical information in the EHR or other health record, independently interpreting results and communicating results to the patient/family, discussing treatment and goals, answering patient's questions and coordinating care.  Cc:  Elfredia Nevins, MD  Marlowe Kays 01/22/2023 1:59 PM

## 2023-01-23 ENCOUNTER — Other Ambulatory Visit: Payer: Self-pay

## 2023-01-23 ENCOUNTER — Inpatient Hospital Stay: Payer: No Typology Code available for payment source

## 2023-01-24 ENCOUNTER — Other Ambulatory Visit: Payer: Self-pay

## 2023-01-24 DIAGNOSIS — E039 Hypothyroidism, unspecified: Secondary | ICD-10-CM | POA: Diagnosis not present

## 2023-01-24 DIAGNOSIS — M6281 Muscle weakness (generalized): Secondary | ICD-10-CM | POA: Diagnosis not present

## 2023-01-24 DIAGNOSIS — R600 Localized edema: Secondary | ICD-10-CM | POA: Diagnosis not present

## 2023-01-24 DIAGNOSIS — L509 Urticaria, unspecified: Secondary | ICD-10-CM | POA: Diagnosis not present

## 2023-01-24 DIAGNOSIS — C9000 Multiple myeloma not having achieved remission: Secondary | ICD-10-CM

## 2023-01-24 DIAGNOSIS — H5789 Other specified disorders of eye and adnexa: Secondary | ICD-10-CM | POA: Diagnosis not present

## 2023-01-24 DIAGNOSIS — D649 Anemia, unspecified: Secondary | ICD-10-CM

## 2023-01-24 DIAGNOSIS — I509 Heart failure, unspecified: Secondary | ICD-10-CM | POA: Diagnosis not present

## 2023-01-24 DIAGNOSIS — I2510 Atherosclerotic heart disease of native coronary artery without angina pectoris: Secondary | ICD-10-CM | POA: Diagnosis not present

## 2023-01-27 ENCOUNTER — Inpatient Hospital Stay: Payer: No Typology Code available for payment source | Admitting: *Deleted

## 2023-01-27 DIAGNOSIS — D649 Anemia, unspecified: Secondary | ICD-10-CM

## 2023-01-27 DIAGNOSIS — I509 Heart failure, unspecified: Secondary | ICD-10-CM | POA: Diagnosis not present

## 2023-01-27 DIAGNOSIS — C9000 Multiple myeloma not having achieved remission: Secondary | ICD-10-CM | POA: Insufficient documentation

## 2023-01-27 DIAGNOSIS — D539 Nutritional anemia, unspecified: Secondary | ICD-10-CM | POA: Insufficient documentation

## 2023-01-27 DIAGNOSIS — R600 Localized edema: Secondary | ICD-10-CM | POA: Diagnosis not present

## 2023-01-27 DIAGNOSIS — H5789 Other specified disorders of eye and adnexa: Secondary | ICD-10-CM | POA: Diagnosis not present

## 2023-01-27 DIAGNOSIS — M6281 Muscle weakness (generalized): Secondary | ICD-10-CM | POA: Diagnosis not present

## 2023-01-27 DIAGNOSIS — L509 Urticaria, unspecified: Secondary | ICD-10-CM | POA: Diagnosis not present

## 2023-01-27 DIAGNOSIS — I2510 Atherosclerotic heart disease of native coronary artery without angina pectoris: Secondary | ICD-10-CM | POA: Diagnosis not present

## 2023-01-27 LAB — CBC WITH DIFFERENTIAL/PLATELET
Abs Immature Granulocytes: 0.01 10*3/uL (ref 0.00–0.07)
Basophils Absolute: 0 10*3/uL (ref 0.0–0.1)
Basophils Relative: 0 %
Eosinophils Absolute: 0.3 10*3/uL (ref 0.0–0.5)
Eosinophils Relative: 5 %
HCT: 24.4 % — ABNORMAL LOW (ref 39.0–52.0)
Hemoglobin: 7.7 g/dL — ABNORMAL LOW (ref 13.0–17.0)
Immature Granulocytes: 0 %
Lymphocytes Relative: 27 %
Lymphs Abs: 1.4 10*3/uL (ref 0.7–4.0)
MCH: 36.3 pg — ABNORMAL HIGH (ref 26.0–34.0)
MCHC: 31.6 g/dL (ref 30.0–36.0)
MCV: 115.1 fL — ABNORMAL HIGH (ref 80.0–100.0)
Monocytes Absolute: 0.7 10*3/uL (ref 0.1–1.0)
Monocytes Relative: 12 %
Neutro Abs: 2.9 10*3/uL (ref 1.7–7.7)
Neutrophils Relative %: 56 %
Platelets: 158 10*3/uL (ref 150–400)
RBC: 2.12 MIL/uL — ABNORMAL LOW (ref 4.22–5.81)
RDW: 15.9 % — ABNORMAL HIGH (ref 11.5–15.5)
WBC: 5.3 10*3/uL (ref 4.0–10.5)
nRBC: 0 % (ref 0.0–0.2)

## 2023-01-27 LAB — COMPREHENSIVE METABOLIC PANEL
ALT: 22 U/L (ref 0–44)
AST: 20 U/L (ref 15–41)
Albumin: 2.9 g/dL — ABNORMAL LOW (ref 3.5–5.0)
Alkaline Phosphatase: 62 U/L (ref 38–126)
Anion gap: 7 (ref 5–15)
BUN: 21 mg/dL (ref 8–23)
CO2: 28 mmol/L (ref 22–32)
Calcium: 8.9 mg/dL (ref 8.9–10.3)
Chloride: 100 mmol/L (ref 98–111)
Creatinine, Ser: 0.86 mg/dL (ref 0.61–1.24)
GFR, Estimated: >60 mL/min (ref >60)
Glucose, Bld: 101 mg/dL — ABNORMAL HIGH (ref 70–99)
Potassium: 2.9 mmol/L — ABNORMAL LOW (ref 3.5–5.1)
Sodium: 135 mmol/L (ref 135–145)
Total Bilirubin: 0.4 mg/dL (ref 0.3–1.2)
Total Protein: 8.1 g/dL (ref 6.5–8.1)

## 2023-01-27 LAB — PREPARE RBC (CROSSMATCH)

## 2023-01-27 LAB — SAMPLE TO BLOOD BANK

## 2023-01-27 LAB — MAGNESIUM: Magnesium: 1.7 mg/dL (ref 1.7–2.4)

## 2023-01-27 NOTE — Progress Notes (Signed)
Patients hgb 7.7, per standing orders, give 1 unit PRBC.  Rojelio Brenner, PA aware that orders were placed.

## 2023-01-28 ENCOUNTER — Inpatient Hospital Stay: Payer: No Typology Code available for payment source

## 2023-01-28 DIAGNOSIS — D539 Nutritional anemia, unspecified: Secondary | ICD-10-CM | POA: Diagnosis not present

## 2023-01-28 DIAGNOSIS — C9000 Multiple myeloma not having achieved remission: Secondary | ICD-10-CM | POA: Diagnosis not present

## 2023-01-28 DIAGNOSIS — D649 Anemia, unspecified: Secondary | ICD-10-CM

## 2023-01-28 LAB — KAPPA/LAMBDA LIGHT CHAINS
Kappa free light chain: 6.8 mg/L (ref 3.3–19.4)
Kappa, lambda light chain ratio: 0.01 — ABNORMAL LOW (ref 0.26–1.65)
Lambda free light chains: 471.7 mg/L — ABNORMAL HIGH (ref 5.7–26.3)

## 2023-01-28 MED ORDER — HEPARIN SOD (PORK) LOCK FLUSH 100 UNIT/ML IV SOLN: 500.0000 [IU] | Freq: Every day | INTRAVENOUS | Status: DC | PRN

## 2023-01-28 MED ORDER — ACETAMINOPHEN 325 MG PO TABS
650.0000 mg | ORAL_TABLET | Freq: Once | ORAL | Status: AC
  Administered 2023-01-28: 650 mg via ORAL
  Filled 2023-01-28: qty 2

## 2023-01-28 MED ORDER — SODIUM CHLORIDE 0.9% IV SOLUTION
250.0000 mL | Freq: Once | INTRAVENOUS | Status: AC
  Administered 2023-01-28: 250 mL via INTRAVENOUS

## 2023-01-28 MED ORDER — DIPHENHYDRAMINE HCL 25 MG PO CAPS
25.0000 mg | ORAL_CAPSULE | Freq: Once | ORAL | Status: AC
  Administered 2023-01-28: 25 mg via ORAL
  Filled 2023-01-28: qty 1

## 2023-01-28 MED ORDER — SODIUM CHLORIDE 0.9% FLUSH: 10.0000 mL | INTRAVENOUS | Status: DC | PRN

## 2023-01-28 NOTE — Progress Notes (Signed)
Patient presents today for 1 unit of PRBC.  Patient is in satisfactory condition with no new complaints voiced. Vital signs are stable.  Hemoglobin on 01/27/2023 was 7.7.  IV placed in L arm.  IV flushed well with good blood return noted.  We will proceed with transfusion per provider orders.   Patient tolerated transfusion well with no complaints voiced.  Patient left via wheelchair to SNF in stable condition.  Vital signs stable at discharge.  Follow up as scheduled.

## 2023-01-28 NOTE — Patient Instructions (Signed)

## 2023-01-29 DIAGNOSIS — I2510 Atherosclerotic heart disease of native coronary artery without angina pectoris: Secondary | ICD-10-CM | POA: Diagnosis not present

## 2023-01-29 DIAGNOSIS — E782 Mixed hyperlipidemia: Secondary | ICD-10-CM | POA: Diagnosis not present

## 2023-01-29 DIAGNOSIS — I509 Heart failure, unspecified: Secondary | ICD-10-CM | POA: Diagnosis not present

## 2023-01-29 LAB — TYPE AND SCREEN
ABO/RH(D): O POS
Antibody Screen: NEGATIVE
Unit division: 0

## 2023-01-29 LAB — BPAM RBC
Blood Product Expiration Date: 202410152359
Unit Type and Rh: 5100
Unit Type and Rh: 5100

## 2023-01-30 DIAGNOSIS — C9000 Multiple myeloma not having achieved remission: Secondary | ICD-10-CM | POA: Diagnosis not present

## 2023-01-30 DIAGNOSIS — Z515 Encounter for palliative care: Secondary | ICD-10-CM | POA: Diagnosis not present

## 2023-01-30 DIAGNOSIS — F03A0 Unspecified dementia, mild, without behavioral disturbance, psychotic disturbance, mood disturbance, and anxiety: Secondary | ICD-10-CM | POA: Diagnosis not present

## 2023-01-31 DIAGNOSIS — R600 Localized edema: Secondary | ICD-10-CM | POA: Diagnosis not present

## 2023-01-31 LAB — PROTEIN ELECTROPHORESIS, SERUM
A/G Ratio: 0.8 (ref 0.7–1.7)
Albumin ELP: 3.3 g/dL (ref 2.9–4.4)
Alpha-1-Globulin: 0.3 g/dL (ref 0.0–0.4)
Alpha-2-Globulin: 0.8 g/dL (ref 0.4–1.0)
Beta Globulin: 0.6 g/dL — ABNORMAL LOW (ref 0.7–1.3)
Gamma Globulin: 2.7 g/dL — ABNORMAL HIGH (ref 0.4–1.8)
Globulin, Total: 4.4 g/dL — ABNORMAL HIGH (ref 2.2–3.9)
M-Spike, %: 2.4 g/dL — ABNORMAL HIGH
Total Protein ELP: 7.7 g/dL (ref 6.0–8.5)

## 2023-02-04 NOTE — Progress Notes (Signed)
Care Coordination Note  02/04/2023 Name: Dale [name] MRN: [id number] DOB: 1928-07-25  Dale [name] is a 89 y.o. year old male who is a primary care patient of Elfredia Nevins, MD and is actively engaged with the care management team. I reached out to Kennon Portela by phone today to assist with re-scheduling a follow up visit with the RN Case Manager  Follow up plan: Unsuccessful telephone outreach attempt made. A HIPAA compliant phone message was left for the patient providing contact information and requesting a return call.   Burman Nieves, CCMA Care Coordination Care Guide Direct Dial: [phone]

## 2023-02-07 NOTE — Progress Notes (Signed)
Care Coordination Note  02/07/2023 Name: Dale [name] MRN: [id number] DOB: 05/13/1928  Dale [name] is a 89 y.o. year old male who is a primary care patient of Elfredia Nevins, MD and is actively engaged with the care management team. I reached out to Kennon Portela by phone today to assist with re-scheduling a follow up visit with the RN Case Manager  Follow up plan: Telephone appointment with care management team member scheduled for: 02/26/2023  Burman Nieves, [facility] Care Coordination Care Guide Direct Dial: [phone]

## 2023-02-10 DIAGNOSIS — E782 Mixed hyperlipidemia: Secondary | ICD-10-CM | POA: Diagnosis not present

## 2023-02-10 DIAGNOSIS — I509 Heart failure, unspecified: Secondary | ICD-10-CM | POA: Diagnosis not present

## 2023-02-10 DIAGNOSIS — I2510 Atherosclerotic heart disease of native coronary artery without angina pectoris: Secondary | ICD-10-CM | POA: Diagnosis not present

## 2023-02-10 DIAGNOSIS — D649 Anemia, unspecified: Secondary | ICD-10-CM | POA: Diagnosis not present

## 2023-02-10 DIAGNOSIS — G894 Chronic pain syndrome: Secondary | ICD-10-CM | POA: Diagnosis not present

## 2023-02-10 DIAGNOSIS — E039 Hypothyroidism, unspecified: Secondary | ICD-10-CM | POA: Diagnosis not present

## 2023-02-10 DIAGNOSIS — C9000 Multiple myeloma not having achieved remission: Secondary | ICD-10-CM | POA: Diagnosis not present

## 2023-02-11 ENCOUNTER — Inpatient Hospital Stay: Payer: Medicare Other

## 2023-02-11 ENCOUNTER — Inpatient Hospital Stay: Payer: Medicare Other | Attending: Hematology | Admitting: Hematology

## 2023-02-11 VITALS — BP 166/69 | HR 74 | Temp 98.4°F | Resp 18 | Wt 124.6 lb

## 2023-02-11 DIAGNOSIS — Z87891 Personal history of nicotine dependence: Secondary | ICD-10-CM | POA: Insufficient documentation

## 2023-02-11 DIAGNOSIS — C9000 Multiple myeloma not having achieved remission: Secondary | ICD-10-CM

## 2023-02-11 DIAGNOSIS — D693 Immune thrombocytopenic purpura: Secondary | ICD-10-CM | POA: Insufficient documentation

## 2023-02-11 DIAGNOSIS — D649 Anemia, unspecified: Secondary | ICD-10-CM

## 2023-02-11 LAB — CBC WITH DIFFERENTIAL/PLATELET
Abs Immature Granulocytes: 0.01 10*3/uL (ref 0.00–0.07)
Basophils Absolute: 0 10*3/uL (ref 0.0–0.1)
Basophils Relative: 0 %
Eosinophils Absolute: 0.2 10*3/uL (ref 0.0–0.5)
Eosinophils Relative: 3 %
HCT: 28.4 % — ABNORMAL LOW (ref 39.0–52.0)
Hemoglobin: 9.3 g/dL — ABNORMAL LOW (ref 13.0–17.0)
Immature Granulocytes: 0 %
Lymphocytes Relative: 35 %
Lymphs Abs: 1.7 10*3/uL (ref 0.7–4.0)
MCH: 36.9 pg — ABNORMAL HIGH (ref 26.0–34.0)
MCHC: 32.7 g/dL (ref 30.0–36.0)
MCV: 112.7 fL — ABNORMAL HIGH (ref 80.0–100.0)
Monocytes Absolute: 0.7 10*3/uL (ref 0.1–1.0)
Monocytes Relative: 15 %
Neutro Abs: 2.2 10*3/uL (ref 1.7–7.7)
Neutrophils Relative %: 47 %
Platelets: 154 10*3/uL (ref 150–400)
RBC: 2.52 MIL/uL — ABNORMAL LOW (ref 4.22–5.81)
RDW: 16.7 % — ABNORMAL HIGH (ref 11.5–15.5)
WBC: 4.8 10*3/uL (ref 4.0–10.5)
nRBC: 0 % (ref 0.0–0.2)

## 2023-02-11 LAB — COMPREHENSIVE METABOLIC PANEL
ALT: 28 U/L (ref 0–44)
AST: 23 U/L (ref 15–41)
Albumin: 3.3 g/dL — ABNORMAL LOW (ref 3.5–5.0)
Alkaline Phosphatase: 66 U/L (ref 38–126)
Anion gap: 7 (ref 5–15)
BUN: 31 mg/dL — ABNORMAL HIGH (ref 8–23)
CO2: 30 mmol/L (ref 22–32)
Calcium: 9.4 mg/dL (ref 8.9–10.3)
Chloride: 100 mmol/L (ref 98–111)
Creatinine, Ser: 0.88 mg/dL (ref 0.61–1.24)
GFR, Estimated: >60 mL/min (ref >60)
Glucose, Bld: 125 mg/dL — ABNORMAL HIGH (ref 70–99)
Potassium: 3.6 mmol/L (ref 3.5–5.1)
Sodium: 137 mmol/L (ref 135–145)
Total Bilirubin: 0.7 mg/dL (ref 0.3–1.2)
Total Protein: 8.6 g/dL — ABNORMAL HIGH (ref 6.5–8.1)

## 2023-02-11 LAB — SAMPLE TO BLOOD BANK

## 2023-02-11 LAB — MAGNESIUM: Magnesium: 2 mg/dL (ref 1.7–2.4)

## 2023-02-11 MED ORDER — DEXAMETHASONE 2 MG PO TABS: 10.0000 mg | ORAL_TABLET | ORAL | 6 refills | Status: DC

## 2023-02-11 NOTE — Progress Notes (Signed)
[facility] [address], [place] [address]    Clinic Day:  02/11/2023  Referring physician: Elfredia Nevins, MD  Patient Care Team: Dale Nevins, MD as PCP - General (Internal Medicine) Dale Bihari, MD as PCP - Cardiology (Cardiology) Dale Bihari, MD as Consulting Physician (Cardiology) Dale Gauss Gerrit Friends, MD as Consulting Physician (Gastroenterology) Dale Massed, MD as Medical Oncologist (Oncology) Dale Daily, RN as [facility] Care Management   ASSESSMENT & PLAN:   Assessment: 1.  IgG lambda plasma cell myeloma: - Work-up for macrocytic anemia on 12/01/2020 showed M spike of 2.9 g.  Immunofixation IgG lambda. - Lambda light chains elevated at 284.  Light chain ratio was 0.04.  LDH was 163.  Creatinine was 0.9 and calcium 8.9. - Bone marrow biopsy on 12/18/2020-hypercellular marrow for age with 48% atypical plasma cells with lambda light chain restriction. - Chromosome analysis was normal. 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 - Revlimid 5 mg, 2 weeks on/1 week off started on 01/20/2021.  Dexamethasone weekly 10 mg added on 02/07/2021.  Revlimid is discontinued around 06/15/2021 due to poor tolerance and ineffectiveness at low-dose.  Thrombocytopenia and anemia. - Velcade weekly on days 1, 8, 15 every 21 days along with dexamethasone 10 mg weekly started on 06/25/2021.   2.  Macrocytic anemia: - CBC on 12/01/2020 with hemoglobin 8.5 and MCV of 117. - Denies any bleeding per rectum or melena.   3.  Social/family history: - Lives at home with his wife.  He does all ADLs and IADLs.  He even does yard work. - He worked at [employer] for 35 years.  Denies any chemical exposure.  Non-smoker. - No family history of malignancies.     Plan: 1.  IgG lambda plasma cell myeloma, high risk: - He was transferred from [facility] re[facility] on 02/10/2023.  He looks better today. - Reviewed labs from 02/11/2023: Normal LFTs and creatinine and calcium.  CBC was grossly normal with hemoglobin 9.3.  Myeloma labs from 01/27/2023 shows M spike is stable at 2.4.  The lambda light chains of 471 and ratio of 0.01. - He is not a candidate for treatment at this time.  We will monitor his labs in a couple of months. - He was told to continue dexamethasone 10 mg weekly which may help controlling his multiple myeloma and also give him energy.  2.  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  No transfusion needed.  Last transfusion was on 01/27/2023.  3.  Ankle swelling: - Use Lasix as needed.  4.  ID prophylaxis: - Discontinue acyclovir and aspirin.   Orders Placed This Encounter  Procedures   Comprehensive metabolic panel    Standing Status:   Future    Standing Expiration Date:   02/11/2024    Order Specific Question:   Release to patient    Answer:   Immediate   CBC with Differential/Platelet    Standing Status:   Future    Standing Expiration Date:   02/11/2024    Order Specific Question:   Release to patient    Answer:   Immediate   Protein electrophoresis, serum    Standing Status:   Future    Standing Expiration Date:   02/11/2024    Order Specific Question:   Release to patient    Answer:  Immediate   Kappa/lambda light chains    Standing Status:   Future    Standing Expiration Date:   02/11/2024     Dale [name],acting as a scribe for Dale Massed, MD.,have documented all relevant documentation on the behalf of Dale Massed, MD,as directed by  Dale Massed, MD while in the presence of Dale Massed, MD.  I, Dale Massed MD, have reviewed the above documentation for accuracy and completeness, and I agree with the above.    Dale Massed, MD   10/8/20244:29 PM  CHIEF COMPLAINT:   Diagnosis: multiple myeloma and anemia    Cancer  Staging  No matching staging information was found for the patient.    Prior Therapy: revlimid  Current Therapy:  Weekly Velcade and daratumumab    HISTORY OF PRESENT ILLNESS:   Oncology History  Multiple myeloma (HCC)  01/15/2021 Initial Diagnosis   Multiple myeloma (HCC)   06/25/2021 - 12/06/2021 Chemotherapy   Patient is on Treatment Plan : MYELOMA NON-TRANSPLANT CANDIDATES VRd weekly q21d     12/20/2021 -  Chemotherapy   Patient is on Treatment Plan : MYELOMA Daratumumab IV q28d        INTERVAL HISTORY:   Dale [name] is a 89 y.o. male presenting to clinic today for follow up of multiple myeloma and anemia. He was last seen by me on 01/13/23.  Since his last visit, he presented to the ED on 01/13/23 for epididymitis with no abscess.   Today, he states that he is doing well overall. His appetite level is at 90%. His energy level is at 60%. He is accompanied by his son.   He currently resides in [place] rehab facility and moved there yesterday. He has a normal appetite. He denies any nausea or vomiting. He reports occasional diarrhea. He has had no recent falls. His son does not believe he was given dexamethasone while at [facility] and would like to know if he should be restarted on it.   PAST MEDICAL HISTORY:   Past Medical History: Past Medical History:  Diagnosis Date   Allergic rhinitis    Anal fissure    Anginal pain (HCC)    Asthma    Cancer (HCC)    skin   Chest pain    CHF (congestive heart failure) (HCC)    Coronary heart disease    s/p stenting. cath in 01/2012 noncritical occlusion   Dyspnea    Dysrhythmia    1st degree heart block   GERD (gastroesophageal reflux disease)    Glaucoma    Hiatal hernia    Hyperlipidemia    Hypertension    Hypothyroidism    Idiopathic thrombocytopenic purpura (HCC) 2002   Macular degeneration    Multiple myeloma (HCC) 01/15/2021   Nephrolithiasis    PUD (peptic ulcer disease)    remote   Sarcoidosis    pulmonary    Schatzki's ring     Surgical History: Past Surgical History:  Procedure Laterality Date   cardiac stents     COLONOSCOPY  10/30/2006   Normal rectum, sigmoid diverticula.Remainder of colonic mucosa appeared normal.   CORONARY ANGIOPLASTY WITH STENT PLACEMENT     about 10 years ago per pt (around 2007)   CYSTOSCOPY WITH RETROGRADE PYELOGRAM, URETEROSCOPY AND STENT PLACEMENT Left 06/16/2017   Procedure: CYSTOSCOPY WITH RETROGRADE PYELOGRAM, URETEROSCOPY,STONE EXTRACTION  AND STENT PLACEMENT;  Surgeon: Marcine Matar, MD;  Location: [facility];  Service: Urology;  Laterality: Left;   ESOPHAGOGASTRODUODENOSCOPY  06/19/2004   Two esophageal rings  and esophageal web as described above.  All of these were disrupted by passing 56-French Elease Hashimoto dilator/ Candida esophagitis,which appears to be incidental given history of   antibiotic use, but nevertheless will be treated.   ESOPHAGOGASTRODUODENOSCOPY  10/30/2006   Distal tandem esophageal ring status post dilation disruption as  described above.  Otherwise normal esophagus/  Small hiatal hernia otherwise normal stomach, D1 and D2   ESOPHAGOGASTRODUODENOSCOPY N/A 03/22/2015   Dr.Rourk- noncritical schatzki's ring and hiatal hernia-o/w normal EGD.    ESOPHAGOGASTRODUODENOSCOPY (EGD) WITH ESOPHAGEAL DILATION  03/04/2012   RMR- schatzki's ring, hiatal hernia, polypoid gastric mucosa, bx= minimally active gastritis.   HOLMIUM LASER APPLICATION Left 06/16/2017   Procedure: HOLMIUM LASER APPLICATION;  Surgeon: Marcine Matar, MD;  Location: [facility];  Service: Urology;  Laterality: Left;   IR CV LINE INJECTION  12/27/2021   IR GENERIC HISTORICAL  03/06/2016   IR RADIOLOGIST EVAL & MGMT 03/06/2016 Irish Lack, MD [facility] INTERV RAD   IR GENERIC HISTORICAL  06/18/2016   IR RADIOLOGIST EVAL & MGMT 06/18/2016 Irish Lack, MD [facility] INTERV RAD   IR IMAGING GUIDED PORT INSERTION  12/18/2021   IR IMAGING GUIDED PORT INSERTION  01/15/2022   IR RADIOLOGIST EVAL &  MGMT  10/01/2016   IR RADIOLOGIST EVAL & MGMT  10/15/2017   IR RADIOLOGIST EVAL & MGMT  12/24/2018   IR RADIOLOGIST EVAL & MGMT  01/04/2020   IR RADIOLOGIST EVAL & MGMT  06/20/2021   IR REMOVAL TUN ACCESS W/ PORT W/O FL MOD SED  01/15/2022   LEFT HEART CATH N/A 02/02/2012   Procedure: LEFT HEART CATH;  Surgeon: Runell Gess, MD;  Location[facility] CATH LAB;  Service: Cardiovascular;  Laterality: N/A;   MEDIASTINOSCOPY     for dx sarcoid   PORT-A-CATH REMOVAL Left 12/20/2022   Procedure: MINOR REMOVAL PORT-A-CATH;  Surgeon: Lewie Chamber, DO;  Location: [facility];  Service: General;  Laterality: Left;   RADIOLOGY WITH ANESTHESIA Left 05/17/2016   Procedure: left renal ablation;  Surgeon: Irish Lack, MD;  Location: [facility];  Service: Radiology;  Laterality: Left;    Social History: 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    Employer: RETIRED  Tobacco Use   Smoking status: Former    Current packs/day: 0.00    Average packs/day: 0.1 packs/day for 2.0 years (0.2 ttl pk-yrs)    Types: Cigarettes, Cigars    Start date: 05/06/1968    Quit date: 05/06/1970    Years since quitting: 52.8   Smokeless tobacco: Never  Vaping Use   Vaping status: Never Used  Substance and Sexual Activity   Alcohol use: No    Alcohol/week: 0.0 standard drinks of alcohol   Drug use: No   Sexual activity: Never  Other Topics Concern   Not on file  Social History Narrative   Right handed   Drinks caffeine   Behind hospital in [place]   In nursing rehab   retired   Chief Executive Officer Determinants of Health   Financial Resource Strain: Low Risk  (12/02/2022)   Overall Financial Resource Strain (CARDIA)    Difficulty of Paying Living Expenses: Not very hard  Food Insecurity: No Food  Insecurity (12/15/2022)   Hunger  Vital Sign    Worried About Programme researcher, broadcasting/film/video in the Last Year: Never true    Ran Out of Food in the Last Year: Never true  Transportation Needs: No Transportation Needs (12/15/2022)   PRAPARE - Administrator, Civil Service (Medical): No    Lack of Transportation (Non-Medical): No  Physical Activity: Inactive (11/25/2022)   Exercise Vital Sign    Days of Exercise per Week: 0 days    Minutes of Exercise per Session: 0 min  Stress: No Stress Concern Present (10/16/2021)   Received from [facility] - Occupational Stress Questionnaire    Feeling of Stress : Not at all  Social Connections: Unknown (09/18/2021)   Received from [facility]   Social Network    Social Network: Not on file  Intimate Partner Violence: Not At Risk (12/15/2022)   Humiliation, Afraid, Rape, and Kick questionnaire    Fear of Current or Ex-Partner: No    Emotionally Abused: No    Physically Abused: No    Sexually Abused: No    Family History: Family History  Problem Relation Age of Onset   Heart disease Father        deceased age 55   Stroke Mother    Alzheimer's disease Mother    Heart attack Brother        deceased age 89   Cancer Other        niece   Colon cancer Neg Hx     Current Medications:  Current Outpatient Medications:    acetaminophen (TYLENOL) 325 MG tablet, Take 2 tablets (650 mg total) by mouth every 6 (six) hours as needed for mild pain (or Fever >/= 101)., Disp: , Rfl:    acyclovir (ZOVIRAX) 400 MG tablet, TAKE 1 TABLET BY MOUTH TWICE  [name], Disp: 180 tablet, Rfl: 0   Ascorbic Acid (VITAMIN C PO), Take 500 mg by mouth every evening., Disp: , Rfl:    aspirin EC 81 MG tablet, Take 1 tablet (81 mg total) by mouth [name] with breakfast. For 30 days Only (Patient taking differently: Take 81 mg by mouth [name] with breakfast. Every Monday, Wednesday, and Friday), Disp: 30 tablet, Rfl: 0   atorvastatin  (LIPITOR) 40 MG tablet, TAKE 1 TABLET BY MOUTH IN THE  EVENING (Patient taking differently: Take 40 mg by mouth every evening.), Disp: 90 tablet, Rfl: 1   Bortezomib (VELCADE IJ), Inject as directed once a week., Disp: , Rfl:    brimonidine (ALPHAGAN) 0.2 % ophthalmic solution, Place 1 drop into the left eye 2 (two) times [name]., Disp: , Rfl:    cyanocobalamin 1000 MCG tablet, Take 1 tablet (1,000 mcg total) by mouth [name]., Disp: 30 tablet, Rfl: 3   dexamethasone (DECADRON) 2 MG tablet, Take 5 tablets (10 mg total) by mouth once a week., Disp: 20 tablet, Rfl: 6   feeding supplement (ENSURE ENLIVE / ENSURE PLUS) LIQD, Take 237 mLs by mouth 3 (three) times [name] between meals., Disp: , Rfl:    isosorbide mononitrate (IMDUR) 60 MG 24 hr tablet, Take 1 tablet (60 mg total) by mouth in the morning and at bedtime., Disp: , Rfl:    levothyroxine (SYNTHROID) 75 MCG tablet, Take 75 mcg by mouth [name] before breakfast., Disp: , Rfl:    lidocaine-prilocaine (EMLA) cream, Apply 1 Application topically as needed., Disp: 30 g, Rfl: 1   losartan (COZAAR) 50 MG tablet, Take 1 tablet (50 mg total) by mouth  [name]., Disp: , Rfl:    metoprolol tartrate (LOPRESSOR) 25 MG tablet, Take 1 tablet (25 mg total) by mouth 2 (two) times [name]. TAKE 1 TABLET BY MOUTH IN  THE MORNING AND ONE-HALF  TABLET BY MOUTH IN THE  EVENING, Disp: , Rfl:    Multiple Vitamins-Minerals (PRESERVISION/LUTEIN) CAPS, Take 1 capsule by mouth 2 (two) times [name]., Disp: , Rfl:    nitroGLYCERIN (NITROSTAT) 0.4 MG SL tablet, Place 1 tablet (0.4 mg total) under the tongue every 5 (five) minutes as needed. For chest pain, Disp: 25 tablet, Rfl: 2   nystatin (MYCOSTATIN/NYSTOP) powder, Apply 1 Application topically 3 (three) times [name]., Disp: 15 g, Rfl: 0   oxyCODONE (ROXICODONE) 5 MG immediate release tablet, Take 1 tablet (5 mg total) by mouth every 6 (six) hours as needed for up to 5 doses for severe pain., Disp: 5 tablet, Rfl: 0   pantoprazole  (PROTONIX) 40 MG tablet, TAKE 1 TABLET BY MOUTH [name]  BEFORE BREAKFAST (Patient taking differently: Take 40 mg by mouth [name].), Disp: 90 tablet, Rfl: 3   prochlorperazine (COMPAZINE) 10 MG tablet, Take 1 tablet (10 mg total) by mouth every 6 (six) hours as needed for nausea or vomiting., Disp: 30 tablet, Rfl: 3   ROCKLATAN 0.02-0.005 % SOLN, Place 1 drop into the left eye at bedtime., Disp: , Rfl:    triamcinolone (KENALOG) 0.1 % cream, Apply 1 application. topically 2 (two) times [name] as needed (for irritation)., Disp: , Rfl:    vitamin E 200 UNIT capsule, Take 200 Units by mouth every evening., Disp: , Rfl:  No current facility-administered medications for this visit.  Facility-Administered Medications Ordered in Other Visits:    acetaminophen (TYLENOL) 325 MG tablet, , , ,    diphenhydrAMINE (BENADRYL) 25 mg capsule, , , ,    montelukast (SINGULAIR) 10 MG tablet, , , ,    Allergies: Allergies  Allergen Reactions   Azithromycin Other (See Comments)    Sore mouth and fever blisters around mouth, sores in nose area as well   Doxazosin Shortness Of Breath   Atenolol Other (See Comments)    UNKNOWN REACTION   Hydrocodone Nausea And Vomiting   Levofloxacin Other (See Comments)    Caused stomach problems.   Morphine Other (See Comments)    "made me crazy"   Penicillins Nausea And Vomiting and Other (See Comments)    Has patient had a PCN reaction causing immediate rash, facial/tongue/throat swelling, SOB or lightheadedness with hypotension: No Has patient had a PCN reaction causing severe rash involving mucus membranes or skin necrosis: No Has patient had a PCN reaction that required hospitalization No Has patient had a PCN reaction occurring within the last 10 years: No If all of the above answers are "NO", then may proceed with Cephalosporin use.    Sulfonamide Derivatives Nausea And Vomiting   Soap Rash    REVIEW OF SYSTEMS:   Review of Systems  Constitutional:  Negative for  chills, fatigue and fever.  HENT:   Negative for lump/mass, mouth sores, nosebleeds, sore throat and trouble swallowing.        +trouble chewing  Eyes:  Negative for eye problems.  Respiratory:  Negative for cough and shortness of breath.   Cardiovascular:  Negative for chest pain, leg swelling and palpitations.  Gastrointestinal:  Positive for diarrhea (occasional). Negative for abdominal pain, constipation, nausea and vomiting.  Genitourinary:  Negative for bladder incontinence, difficulty urinating, dysuria, frequency, hematuria and nocturia.   Musculoskeletal:  Negative  for arthralgias, back pain, flank pain, myalgias and neck pain.  Skin:  Negative for itching and rash.  Neurological:  Negative for dizziness, headaches and numbness.  Hematological:  Does not bruise/bleed easily.  Psychiatric/Behavioral:  Negative for depression, sleep disturbance and suicidal ideas. The patient is not nervous/anxious.   All other systems reviewed and are negative.    VITALS:   Blood pressure (!) 166/69, pulse 74, temperature 98.4 F (36.9 C), temperature source Oral, resp. rate 18, weight 124 lb 9.6 oz (56.5 kg), SpO2 100%.  Wt Readings from Last 3 Encounters:  02/11/23 124 lb 9.6 oz (56.5 kg)  12/23/22 117 lb 8.1 oz (53.3 kg)  12/12/22 126 lb 3.2 oz (57.2 kg)    Body mass index is 18.95 kg/m.  Performance status (ECOG): 1 - Symptomatic but completely ambulatory  PHYSICAL EXAM:   Physical Exam Vitals and nursing note reviewed. Exam conducted with a chaperone present.  Constitutional:      Appearance: Normal appearance.  Cardiovascular:     Rate and Rhythm: Normal rate and regular rhythm.     Pulses: Normal pulses.     Heart sounds: Normal heart sounds.  Pulmonary:     Effort: Pulmonary effort is normal.     Breath sounds: Normal breath sounds.  Abdominal:     Palpations: Abdomen is soft. There is no hepatomegaly, splenomegaly or mass.     Tenderness: There is no abdominal tenderness.   Musculoskeletal:     Right lower leg: No edema.     Left lower leg: No edema.  Lymphadenopathy:     Cervical: No cervical adenopathy.     Right cervical: No superficial, deep or posterior cervical adenopathy.    Left cervical: No superficial, deep or posterior cervical adenopathy.     Upper Body:     Right upper body: No supraclavicular or axillary adenopathy.     Left upper body: No supraclavicular or axillary adenopathy.  Neurological:     General: No focal deficit present.     Mental Status: He is alert and oriented to person, place, and time.  Psychiatric:        Mood and Affect: Mood normal.        Behavior: Behavior normal.     LABS:      Latest Ref Rng & Units 02/11/2023    1:01 PM 01/27/2023    9:18 AM 01/13/2023   12:30 PM  CBC  WBC 4.0 - 10.5 K/uL 4.8  5.3  7.6   Hemoglobin 13.0 - 17.0 g/dL 9.3  7.7  8.5   Hematocrit 39.0 - 52.0 % 28.4  24.4  25.3   Platelets 150 - 400 K/uL 154  158  170       Latest Ref Rng & Units 02/11/2023    1:01 PM 01/27/2023    9:18 AM 01/13/2023   12:30 PM  CMP  Glucose 70 - 99 mg/dL 409  811  96   BUN 8 - 23 mg/dL 31  21  18    Creatinine 0.61 - 1.24 mg/dL 9.14  7.82  9.56   Sodium 135 - 145 mmol/L 137  135  133   Potassium 3.5 - 5.1 mmol/L 3.6  2.9  3.7   Chloride 98 - 111 mmol/L 100  100  107   CO2 22 - 32 mmol/L 30  28  23    Calcium 8.9 - 10.3 mg/dL 9.4  8.9  9.0   Total Protein 6.5 - 8.1 g/dL 8.6  8.1  8.6   Total Bilirubin 0.3 - 1.2 mg/dL 0.7  0.4  0.5   Alkaline Phos 38 - 126 U/L 66  62  84   AST 15 - 41 U/L 23  20  20    ALT 0 - 44 U/L 28  22  23       No results found for: "CEA1", "CEA" / No results found for: "CEA1", "CEA" No results found for: "PSA1" No results found for: "ZOX096" No results found for: "CAN125"  Lab Results  Component Value Date   TOTALPROTELP 7.7 01/27/2023   ALBUMINELP 3.3 01/27/2023   A1GS 0.3 01/27/2023   A2GS 0.8 01/27/2023   BETS 0.6 (L) 01/27/2023   GAMS 2.7 (H) 01/27/2023   MSPIKE 2.4 (H)  01/27/2023   SPEI Comment 01/27/2023   Lab Results  Component Value Date   TIBC 257 08/23/2021   TIBC 298 02/07/2021   TIBC 263 05/27/2017   FERRITIN 1,415 (H) 01/24/2022   FERRITIN 939 (H) 08/23/2021   FERRITIN 123 02/07/2021   IRONPCTSAT 90 (H) 08/23/2021   IRONPCTSAT 17 (L) 02/07/2021   IRONPCTSAT 44 05/27/2017   Lab Results  Component Value Date   LDH 115 05/01/2021   LDH 120 04/09/2021   LDH 124 04/02/2021     STUDIES:   US SCROTUM W/DOPPLER  Result Date: 01/13/2023 CLINICAL DATA:  Scrotal swelling and pain EXAM: SCROTAL ULTRASOUND DOPPLER ULTRASOUND OF THE TESTICLES TECHNIQUE: Complete ultrasound examination of the testicles, epididymis, and other scrotal structures was performed. Color and spectral Doppler ultrasound were also utilized to evaluate blood flow to the testicles. COMPARISON:  None Available. FINDINGS: Right testicle Measurements: 3.6 x 2.4 x 2.3 cm, 10.4 mL. Mild tubular ectasia of the rete testis. Left testicle Measurements: 3.6 x 3.2 x 2.2 cm, 13.4 mL. 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 Right epididymis:  Multiple small cysts within the epididymal head. Left epididymis: Multicystic structure inseparable from the left epididymis measures up to 3.7 x 2.5 x 2.3 cm. Hydrocele:  Small bilateral hydroceles. Varicocele:  None visualized. Pulsed Doppler interrogation of both testes demonstrates normal low resistance arterial and venous waveforms bilaterally. IMPRESSION: 1. No evidence of testicular torsion. 2. Multicystic structure inseparable from the left epididymis measures up to 3.7 cm, likely a spermatocele. 3. Small bilateral hydroceles. 4. 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 Electronically Signed   By: Agustin Cree M.D.   On: 01/13/2023 19:23

## 2023-02-11 NOTE — Patient Instructions (Signed)
[facility]  Discharge Instructions  You were seen and examined today by Dr. Ellin Saba.  Dr. Ellin Saba discussed your most recent lab work which revealed that everything looks stable.   You can stop taking the acyclovir. Continue taking the dexamethasone 10 mg weekly.  Follow-up as scheduled in 2 months.    Thank you for choosing [facility] to provide your oncology and hematology care.   To afford each patient quality time with our provider, please arrive at least 15 minutes before your scheduled appointment time. You may need to reschedule your appointment if you arrive late (10 or more minutes). Arriving late affects you and other patients whose appointments are after yours.  Also, if you miss three or more appointments without notifying the office, you may be dismissed from the clinic at the provider's discretion.    Again, thank you for choosing [facility].  Our hope is that these requests will decrease the amount of time that you wait before being seen by our physicians.   If you have a lab appointment with the Cancer Center - please note that after April 8th, all labs will be drawn in the cancer center.  You do not have to check in or register with the main entrance as you have in the past but will complete your check-in at the cancer center.            _____________________________________________________________  Should you have questions after your visit to [facility], please contact our office at [phone] and follow the prompts.  Our office hours are 8:00 a.m. to 4:30 p.m. Monday - Thursday and 8:00 a.m. to 2:30 p.m. Friday.  Please note that voicemails left after 4:00 p.m. may not be returned until the following business day.  We are closed weekends and all major holidays.  You do have access to a nurse 24-7, just call the main number to the clinic [phone] and do not press any options,  hold on the line and a nurse will answer the phone.    For prescription refill requests, have your pharmacy contact our office and allow 72 hours.    Masks are no longer required in the cancer centers. If you would like for your care team to wear a mask while they are taking care of you, please let them know. You may have one support person who is at least 87 years old accompany you for your appointments.

## 2023-02-13 DIAGNOSIS — G9341 Metabolic encephalopathy: Secondary | ICD-10-CM | POA: Diagnosis not present

## 2023-02-13 DIAGNOSIS — Z9181 History of falling: Secondary | ICD-10-CM | POA: Diagnosis not present

## 2023-02-13 DIAGNOSIS — I2510 Atherosclerotic heart disease of native coronary artery without angina pectoris: Secondary | ICD-10-CM | POA: Diagnosis not present

## 2023-02-13 DIAGNOSIS — C9000 Multiple myeloma not having achieved remission: Secondary | ICD-10-CM | POA: Diagnosis not present

## 2023-02-13 DIAGNOSIS — E538 Deficiency of other specified B group vitamins: Secondary | ICD-10-CM | POA: Diagnosis not present

## 2023-02-13 DIAGNOSIS — M6281 Muscle weakness (generalized): Secondary | ICD-10-CM | POA: Diagnosis not present

## 2023-02-13 DIAGNOSIS — I110 Hypertensive heart disease with heart failure: Secondary | ICD-10-CM | POA: Diagnosis not present

## 2023-02-13 DIAGNOSIS — M25512 Pain in left shoulder: Secondary | ICD-10-CM | POA: Diagnosis not present

## 2023-02-13 DIAGNOSIS — J45998 Other asthma: Secondary | ICD-10-CM | POA: Diagnosis not present

## 2023-02-13 DIAGNOSIS — B9689 Other specified bacterial agents as the cause of diseases classified elsewhere: Secondary | ICD-10-CM | POA: Diagnosis not present

## 2023-02-13 DIAGNOSIS — E039 Hypothyroidism, unspecified: Secondary | ICD-10-CM | POA: Diagnosis not present

## 2023-02-13 DIAGNOSIS — D61818 Other pancytopenia: Secondary | ICD-10-CM | POA: Diagnosis not present

## 2023-02-13 DIAGNOSIS — L03116 Cellulitis of left lower limb: Secondary | ICD-10-CM | POA: Diagnosis not present

## 2023-02-13 DIAGNOSIS — I509 Heart failure, unspecified: Secondary | ICD-10-CM | POA: Diagnosis not present

## 2023-02-17 DIAGNOSIS — I509 Heart failure, unspecified: Secondary | ICD-10-CM | POA: Diagnosis not present

## 2023-02-17 DIAGNOSIS — I110 Hypertensive heart disease with heart failure: Secondary | ICD-10-CM | POA: Diagnosis not present

## 2023-02-17 DIAGNOSIS — L03116 Cellulitis of left lower limb: Secondary | ICD-10-CM | POA: Diagnosis not present

## 2023-02-17 DIAGNOSIS — G9341 Metabolic encephalopathy: Secondary | ICD-10-CM | POA: Diagnosis not present

## 2023-02-17 DIAGNOSIS — C9000 Multiple myeloma not having achieved remission: Secondary | ICD-10-CM | POA: Diagnosis not present

## 2023-02-17 DIAGNOSIS — J45998 Other asthma: Secondary | ICD-10-CM | POA: Diagnosis not present

## 2023-02-18 ENCOUNTER — Encounter: Payer: Medicare Other | Admitting: Orthopaedic Surgery

## 2023-02-18 ENCOUNTER — Ambulatory Visit (INDEPENDENT_AMBULATORY_CARE_PROVIDER_SITE_OTHER): Payer: Medicare Other | Admitting: Orthopaedic Surgery

## 2023-02-18 ENCOUNTER — Encounter: Payer: Self-pay | Admitting: Orthopaedic Surgery

## 2023-02-18 VITALS — BP 151/95 | HR 80 | Ht 68.0 in | Wt 127.0 lb

## 2023-02-18 DIAGNOSIS — R600 Localized edema: Secondary | ICD-10-CM

## 2023-02-18 NOTE — Progress Notes (Signed)
His feet swell and we are concerned about cellulitis that he had before.  History from representative at rest home where he is staying.  He is confused at times.  I have report from the rest home.  He has edema of both feet and ankles and some brown discoloration also, worse on the right.  He has no trauma.  Clinically he has brawny edema of both feet, more on the right than the left.  NV intact but pulses weak.  He has not been on his Lasix and his family doctor has just Rx'd it to the rest home this morning.  He will need to take that and that should help the edema.  The brownish changes will remain.  Encounter Diagnosis  Name Primary?   Edema of both feet Yes   I have filled out forms for the rest home.  Return prn.  He has no cellulitis.  Call if any problem.  Precautions discussed.  Electronically Signed Darreld Mclean, MD 10/15/20249:36 AM

## 2023-02-19 DIAGNOSIS — C9000 Multiple myeloma not having achieved remission: Secondary | ICD-10-CM | POA: Diagnosis not present

## 2023-02-19 DIAGNOSIS — J45998 Other asthma: Secondary | ICD-10-CM | POA: Diagnosis not present

## 2023-02-19 DIAGNOSIS — G9341 Metabolic encephalopathy: Secondary | ICD-10-CM | POA: Diagnosis not present

## 2023-02-19 DIAGNOSIS — L03116 Cellulitis of left lower limb: Secondary | ICD-10-CM | POA: Diagnosis not present

## 2023-02-19 DIAGNOSIS — I509 Heart failure, unspecified: Secondary | ICD-10-CM | POA: Diagnosis not present

## 2023-02-19 DIAGNOSIS — I110 Hypertensive heart disease with heart failure: Secondary | ICD-10-CM | POA: Diagnosis not present

## 2023-02-24 DIAGNOSIS — G9341 Metabolic encephalopathy: Secondary | ICD-10-CM | POA: Diagnosis not present

## 2023-02-24 DIAGNOSIS — L03116 Cellulitis of left lower limb: Secondary | ICD-10-CM | POA: Diagnosis not present

## 2023-02-24 DIAGNOSIS — I110 Hypertensive heart disease with heart failure: Secondary | ICD-10-CM | POA: Diagnosis not present

## 2023-02-24 DIAGNOSIS — C9000 Multiple myeloma not having achieved remission: Secondary | ICD-10-CM | POA: Diagnosis not present

## 2023-02-24 DIAGNOSIS — J45998 Other asthma: Secondary | ICD-10-CM | POA: Diagnosis not present

## 2023-02-24 DIAGNOSIS — I509 Heart failure, unspecified: Secondary | ICD-10-CM | POA: Diagnosis not present

## 2023-02-25 DIAGNOSIS — I110 Hypertensive heart disease with heart failure: Secondary | ICD-10-CM | POA: Diagnosis not present

## 2023-02-25 DIAGNOSIS — I509 Heart failure, unspecified: Secondary | ICD-10-CM | POA: Diagnosis not present

## 2023-02-25 DIAGNOSIS — C9000 Multiple myeloma not having achieved remission: Secondary | ICD-10-CM | POA: Diagnosis not present

## 2023-02-25 DIAGNOSIS — J45998 Other asthma: Secondary | ICD-10-CM | POA: Diagnosis not present

## 2023-02-25 DIAGNOSIS — G9341 Metabolic encephalopathy: Secondary | ICD-10-CM | POA: Diagnosis not present

## 2023-02-25 DIAGNOSIS — L03116 Cellulitis of left lower limb: Secondary | ICD-10-CM | POA: Diagnosis not present

## 2023-02-26 ENCOUNTER — Ambulatory Visit: Payer: Self-pay | Admitting: *Deleted

## 2023-02-26 NOTE — Patient Outreach (Signed)
Care Coordination   02/26/2023 Name: JONDAVID SINANAN MRN: [id number] DOB: 1928-10-16   Care Coordination Outreach Attempts:  An unsuccessful telephone outreach was attempted for a scheduled appointment today.  Follow Up Plan:  No further outreach attempts will be made at this time. We have been unable to contact the patient to offer or enroll patient in care coordination services. Patient's Primary Care office is not partnering with the [facility] for care management and will be providing Care Management Services themselves. This final Care Management note will be securely faxed to the PCP office for handoff. Patient has been encouraged to reach out to their PCP office with any resource or care management needs.    Encounter Outcome:  No Answer. Left HIPAA compliant VM.   Care Coordination Interventions:  No, not indicated    Demetrios Loll, RN, BSN Care Management Coordinator [facility]  [facility] Direct Dial: [phone] Main #: [phone]

## 2023-02-28 ENCOUNTER — Ambulatory Visit (INDEPENDENT_AMBULATORY_CARE_PROVIDER_SITE_OTHER): Payer: Medicare Other | Admitting: Urology

## 2023-02-28 VITALS — BP 122/50 | HR 65

## 2023-02-28 DIAGNOSIS — N401 Enlarged prostate with lower urinary tract symptoms: Secondary | ICD-10-CM

## 2023-02-28 DIAGNOSIS — C9000 Multiple myeloma not having achieved remission: Secondary | ICD-10-CM | POA: Diagnosis not present

## 2023-02-28 DIAGNOSIS — I110 Hypertensive heart disease with heart failure: Secondary | ICD-10-CM | POA: Diagnosis not present

## 2023-02-28 DIAGNOSIS — Z85528 Personal history of other malignant neoplasm of kidney: Secondary | ICD-10-CM | POA: Diagnosis not present

## 2023-02-28 DIAGNOSIS — N481 Balanitis: Secondary | ICD-10-CM

## 2023-02-28 DIAGNOSIS — R351 Nocturia: Secondary | ICD-10-CM | POA: Diagnosis not present

## 2023-02-28 DIAGNOSIS — I509 Heart failure, unspecified: Secondary | ICD-10-CM | POA: Diagnosis not present

## 2023-02-28 DIAGNOSIS — G9341 Metabolic encephalopathy: Secondary | ICD-10-CM | POA: Diagnosis not present

## 2023-02-28 LAB — URINALYSIS, ROUTINE W REFLEX MICROSCOPIC
Bilirubin, UA: NEGATIVE
Glucose, UA: NEGATIVE
Ketones, UA: NEGATIVE
Leukocytes,UA: NEGATIVE
Nitrite, UA: NEGATIVE
Protein,UA: NEGATIVE
RBC, UA: NEGATIVE
Specific Gravity, UA: 1.015 (ref 1.005–1.030)
Urobilinogen, Ur: 0.2 mg/dL (ref 0.2–1.0)
pH, UA: 6.5 (ref 5.0–7.5)

## 2023-02-28 MED ORDER — CLOTRIMAZOLE-BETAMETHASONE 1-0.05 % EX CREA: 1.0000 | TOPICAL_CREAM | Freq: Two times a day (BID) | CUTANEOUS | 3 refills | Status: DC

## 2023-02-28 NOTE — Progress Notes (Unsigned)
02/28/2023 12:29 PM   Kennon Portela [birth year] [id number]  Referring provider: Elfredia Nevins, MD [address] [place],  [place] [address]  No chief complaint on file.   HPI:    PMH: Past Medical History:  Diagnosis Date   Allergic rhinitis    Anal fissure    Anginal pain (HCC)    Asthma    Cancer (HCC)    skin   Chest pain    CHF (congestive heart failure) (HCC)    Coronary heart disease    s/p stenting. cath in 01/2012 noncritical occlusion   Dyspnea    Dysrhythmia    1st degree heart block   GERD (gastroesophageal reflux disease)    Glaucoma    Hiatal hernia    Hyperlipidemia    Hypertension    Hypothyroidism    Idiopathic thrombocytopenic purpura (HCC) 2002   Macular degeneration    Multiple myeloma (HCC) 01/15/2021   Nephrolithiasis    PUD (peptic ulcer disease)    remote   Sarcoidosis    pulmonary   Schatzki's ring     Surgical History: Past Surgical History:  Procedure Laterality Date   cardiac stents     COLONOSCOPY  10/30/2006   Normal rectum, sigmoid diverticula.Remainder of colonic mucosa appeared normal.   CORONARY ANGIOPLASTY WITH STENT PLACEMENT     about 10 years ago per pt (around 2007)   CYSTOSCOPY WITH RETROGRADE PYELOGRAM, URETEROSCOPY AND STENT PLACEMENT Left 06/16/2017   Procedure: CYSTOSCOPY WITH RETROGRADE PYELOGRAM, URETEROSCOPY,STONE EXTRACTION  AND STENT PLACEMENT;  Surgeon: Marcine Matar, MD;  Location: [facility];  Service: Urology;  Laterality: Left;   ESOPHAGOGASTRODUODENOSCOPY  06/19/2004   Two esophageal rings and esophageal web as described above.  All of these were disrupted by passing 56-French Elease Hashimoto dilator/ Candida esophagitis,which appears to be incidental given history of   antibiotic use, but nevertheless will be treated.   ESOPHAGOGASTRODUODENOSCOPY  10/30/2006   Distal tandem esophageal ring status post dilation disruption as  described above.  Otherwise normal esophagus/  Small hiatal hernia otherwise  normal stomach, D1 and D2   ESOPHAGOGASTRODUODENOSCOPY N/A 03/22/2015   Dr.Rourk- noncritical schatzki's ring and hiatal hernia-o/w normal EGD.    ESOPHAGOGASTRODUODENOSCOPY (EGD) WITH ESOPHAGEAL DILATION  03/04/2012   RMR- schatzki's ring, hiatal hernia, polypoid gastric mucosa, bx= minimally active gastritis.   HOLMIUM LASER APPLICATION Left 06/16/2017   Procedure: HOLMIUM LASER APPLICATION;  Surgeon: Marcine Matar, MD;  Location: [facility];  Service: Urology;  Laterality: Left;   IR CV LINE INJECTION  12/27/2021   IR GENERIC HISTORICAL  03/06/2016   IR RADIOLOGIST EVAL & MGMT 03/06/2016 Irish Lack, MD [facility] INTER[facility] RAD   IR GENERIC HISTORICAL  06/18/2016   IR RADIOLOGIST EVAL & MGMT 06/18/2016 Irish Lack, MD [facility] RAD   IR IMAGING GUIDED PORT INSERTION  12/18/2021   IR IMAGING GUIDED PORT INSERTION  01/15/2022   IR RADIOLOGIST EVAL & MGMT  10/01/2016   IR RADIOLOGIST EVAL & MGMT  10/15/2017   IR RADIOLOGIST EVAL & MGMT  12/24/2018   IR RADIOLOGIST EVAL & MGMT  01/04/2020   IR RADIOLOGIST EVAL & MGMT  06/20/2021   IR REMOVAL TUN ACCESS W/ PORT W/O FL MOD SED  01/15/2022   LEFT HEART CATH N/A 02/02/2012   Procedure: LEFT HEART CATH;  Surgeon: Runell Gess, MD;  Location: [facility] CATH LAB;  Service: Cardiovascular;  Laterality: N/A;   MEDIASTINOSCOPY     for dx sarcoid   PORT-A-CATH REMOVAL Left 12/20/2022  Procedure: MINOR REMOVAL PORT-A-CATH;  Surgeon: Lewie Chamber, DO;  Location: [facility];  Service: General;  Laterality: Left;   RADIOLOGY WITH ANESTHESIA Left 05/17/2016   Procedure: left renal ablation;  Surgeon: Irish Lack, MD;  Location: [facility];  Service: Radiology;  Laterality: Left;    Home Medications:  Allergies as of 02/28/2023       Reactions   Azithromycin Other (See Comments)   Sore mouth and fever blisters around mouth, sores in nose area as well   Doxazosin Shortness Of Breath   Atenolol Other (See Comments)   UNKNOWN REACTION   Hydrocodone Nausea  And Vomiting   Levofloxacin Other (See Comments)   Caused stomach problems.   Morphine Other (See Comments)   "made me crazy"   Penicillins Nausea And Vomiting, Other (See Comments)   Has patient had a PCN reaction causing immediate rash, facial/tongue/throat swelling, SOB or lightheadedness with hypotension: No Has patient had a PCN reaction causing severe rash involving mucus membranes or skin necrosis: No Has patient had a PCN reaction that required hospitalization No Has patient had a PCN reaction occurring within the last 10 years: No If all of the above answers are "NO", then may proceed with Cephalosporin use.   Sulfonamide Derivatives Nausea And Vomiting   Soap Rash        Medication List        Accurate as of October 25, 87 12:29 PM. If you have any questions, ask your nurse or doctor.          acetaminophen 325 MG tablet Commonly known as: TYLENOL Take 2 tablets (650 mg total) by mouth every 6 (six) hours as needed for mild pain (or Fever >/= 101).   acyclovir 400 MG tablet Commonly known as: ZOVIRAX TAKE 1 TABLET BY MOUTH TWICE  DAILY   aspirin EC 81 MG tablet Take 1 tablet (81 mg total) by mouth daily with breakfast. For 30 days Only   atorvastatin 40 MG tablet Commonly known as: LIPITOR TAKE 1 TABLET BY MOUTH IN THE  EVENING   brimonidine 0.2 % ophthalmic solution Commonly known as: ALPHAGAN Place 1 drop into the left eye 2 (two) times daily.   cyanocobalamin 1000 MCG tablet Take 1 tablet (1,000 mcg total) by mouth daily.   dexamethasone 2 MG tablet Commonly known as: DECADRON Take 5 tablets (10 mg total) by mouth once a week.   feeding supplement Liqd Take 237 mLs by mouth 3 (three) times daily between meals.   furosemide 40 MG tablet Commonly known as: LASIX Take 40 mg by mouth 2 (two) times daily.   isosorbide mononitrate 60 MG 24 hr tablet Commonly known as: IMDUR Take 1 tablet (60 mg total) by mouth in the morning and at bedtime.    levothyroxine 75 MCG tablet Commonly known as: SYNTHROID Take 75 mcg by mouth daily before breakfast.   lidocaine-prilocaine cream Commonly known as: EMLA Apply 1 Application topically as needed.   losartan 50 MG tablet Commonly known as: COZAAR Take 1 tablet (50 mg total) by mouth daily.   metoprolol tartrate 25 MG tablet Commonly known as: LOPRESSOR Take 1 tablet (25 mg total) by mouth 2 (two) times daily. TAKE 1 TABLET BY MOUTH IN  THE MORNING AND ONE-HALF  TABLET BY MOUTH IN THE  EVENING   nitroGLYCERIN 0.4 MG SL tablet Commonly known as: NITROSTAT Place 1 tablet (0.4 mg total) under the tongue every 5 (five) minutes as needed. For chest pain   nystatin  powder Commonly known as: MYCOSTATIN/NYSTOP Apply 1 Application topically 3 (three) times daily.   oxyCODONE 5 MG immediate release tablet Commonly known as: Roxicodone Take 1 tablet (5 mg total) by mouth every 6 (six) hours as needed for up to 5 doses for severe pain.   pantoprazole 40 MG tablet Commonly known as: PROTONIX TAKE 1 TABLET BY MOUTH DAILY  BEFORE BREAKFAST What changed: when to take this   PreserVision/Lutein Caps Take 1 capsule by mouth 2 (two) times daily.   prochlorperazine 10 MG tablet Commonly known as: COMPAZINE Take 1 tablet (10 mg total) by mouth every 6 (six) hours as needed for nausea or vomiting.   Rocklatan 0.02-0.005 % Soln Generic drug: Netarsudil-Latanoprost Place 1 drop into the left eye at bedtime.   triamcinolone cream 0.1 % Commonly known as: KENALOG Apply 1 application. topically 2 (two) times daily as needed (for irritation).   VELCADE IJ Inject as directed once a week.   VITAMIN C PO Take 500 mg by mouth every evening.   vitamin E 200 UNIT capsule Take 200 Units by mouth every evening.        Allergies:  Allergies  Allergen Reactions   Azithromycin Other (See Comments)    Sore mouth and fever blisters around mouth, sores in nose area as well   Doxazosin  Shortness Of Breath   Atenolol Other (See Comments)    UNKNOWN REACTION   Hydrocodone Nausea And Vomiting   Levofloxacin Other (See Comments)    Caused stomach problems.   Morphine Other (See Comments)    "made me crazy"   Penicillins Nausea And Vomiting and Other (See Comments)    Has patient had a PCN reaction causing immediate rash, facial/tongue/throat swelling, SOB or lightheadedness with hypotension: No Has patient had a PCN reaction causing severe rash involving mucus membranes or skin necrosis: No Has patient had a PCN reaction that required hospitalization No Has patient had a PCN reaction occurring within the last 10 years: No If all of the above answers are "NO", then may proceed with Cephalosporin use.    Sulfonamide Derivatives Nausea And Vomiting   Soap Rash    Family History: Family History  Problem Relation Age of Onset   Heart disease Father        deceased age 25   Stroke Mother    Alzheimer's disease Mother    Heart attack Brother        deceased age 25   Cancer Other        niece   Colon cancer Neg Hx     Social History:  reports that he quit smoking about 52 years ago. His smoking use included cigarettes and cigars. He started smoking about 54 years ago. He has a 0.2 pack-year smoking history. He has never used smokeless tobacco. He reports that he does not drink alcohol and does not use drugs.  ROS: All other review of systems were reviewed and are negative except what is noted above in HPI  Physical Exam: BP (!) 122/50   Pulse 65   Constitutional:  Alert and oriented, No acute distress. HEENT: [place] AT, moist mucus membranes.  Trachea midline, no masses. Cardiovascular: No clubbing, cyanosis, or edema. Respiratory: Normal respiratory effort, no increased work of breathing. GI: Abdomen is soft, nontender, nondistended, no abdominal masses GU: No CVA tenderness. Circumcised phallus. No masses/lesions on penis, testis, scrotum. Prostate 40g smooth no  nodules no induration.  Lymph: No cervical or inguinal lymphadenopathy. Skin: No rashes, bruises  or suspicious lesions. Neurologic: Grossly intact, no focal deficits, moving all 4 extremities. Psychiatric: Normal mood and affect.  Laboratory Data: Lab Results  Component Value Date   WBC 4.8 02/11/2023   HGB 9.3 (L) 02/11/2023   HCT 28.4 (L) 02/11/2023   MCV 112.7 (H) 02/11/2023   PLT 154 02/11/2023    Lab Results  Component Value Date   CREATININE 0.88 02/11/2023    No results found for: "PSA"  No results found for: "TESTOSTERONE"  Lab Results  Component Value Date   HGBA1C 6.3 (H) 06/03/2022    Urinalysis    Component Value Date/Time   COLORURINE YELLOW 01/13/2023 1350   APPEARANCEUR HAZY (A) 01/13/2023 1350   APPEARANCEUR Clear 08/21/2021 1415   LABSPEC 1.017 01/13/2023 1350   PHURINE 6.0 01/13/2023 1350   GLUCOSEU NEGATIVE 01/13/2023 1350   HGBUR NEGATIVE 01/13/2023 1350   BILIRUBINUR NEGATIVE 01/13/2023 1350   BILIRUBINUR Negative 08/21/2021 1415   KETONESUR NEGATIVE 01/13/2023 1350   PROTEINUR 30 (A) 01/13/2023 1350   UROBILINOGEN 1.0 02/02/2012 0630   NITRITE NEGATIVE 01/13/2023 1350   LEUKOCYTESUR NEGATIVE 01/13/2023 1350    Lab Results  Component Value Date   LABMICR Comment 08/21/2021   BACTERIA NONE SEEN 01/13/2023    Pertinent Imaging: *** No results found for this or any previous visit.  Results for orders placed during the hospital encounter of 11/17/22  US Venous Img Lower Bilateral (DVT)  Narrative CLINICAL DATA:  Elevated D-dimer.  EXAM: BILATERAL LOWER EXTREMITY VENOUS DOPPLER ULTRASOUND  TECHNIQUE: Gray-scale sonography with graded compression, as well as color Doppler and duplex ultrasound were performed to evaluate the lower extremity deep venous systems from the level of the common femoral vein and including the common femoral, femoral, profunda femoral, popliteal and calf veins including the posterior tibial,  peroneal and gastrocnemius veins when visible. The superficial great saphenous vein was also interrogated. Spectral Doppler was utilized to evaluate flow at rest and with distal augmentation maneuvers in the common femoral, femoral and popliteal veins.  COMPARISON:  None Available.  FINDINGS: RIGHT LOWER EXTREMITY  Common Femoral Vein: No evidence of thrombus. Normal compressibility, respiratory phasicity and response to augmentation.  Saphenofemoral Junction: No evidence of thrombus. Normal compressibility and flow on color Doppler imaging.  Profunda Femoral Vein: No evidence of thrombus. Normal compressibility and flow on color Doppler imaging.  Femoral Vein: No evidence of thrombus. Normal compressibility, respiratory phasicity and response to augmentation.  Popliteal Vein: No evidence of thrombus. Normal compressibility, respiratory phasicity and response to augmentation.  Calf Veins: Visualized right deep calf veins are patent without thrombus.  Other Findings:  None.  LEFT LOWER EXTREMITY  Common Femoral Vein: No evidence of thrombus. Normal compressibility, respiratory phasicity and response to augmentation.  Saphenofemoral Junction: No evidence of thrombus. Normal compressibility and flow on color Doppler imaging.  Profunda Femoral Vein: No evidence of thrombus. Normal compressibility and flow on color Doppler imaging.  Femoral Vein: No evidence of thrombus. Normal compressibility, respiratory phasicity and response to augmentation.  Popliteal Vein: No evidence of thrombus. Normal compressibility, respiratory phasicity and response to augmentation.  Calf Veins: Visualized left deep calf veins are patent without thrombus.  Other Findings:  None.  IMPRESSION: No evidence of deep venous thrombosis in either lower extremity.   Electronically Signed By: Richarda Overlie M.D. On: 11/18/2022 12:02  No results found for this or any previous visit.  No results  found for this or any previous visit.  Results for orders placed during the  hospital encounter of 08/05/17  US RENAL  Narrative CLINICAL DATA:  Status post left lithotripsy, ureteral calculus  EXAM: RENAL / URINARY TRACT ULTRASOUND COMPLETE  COMPARISON:  CT abdomen 06/02/2017  FINDINGS: Right Kidney:  Length: 10.3 cm. Echogenicity within normal limits. No mass or hydronephrosis visualized.  Left Kidney:  Length: 10.7 cm. Echogenicity within normal limits. Moderate left hydronephrosis. Proximal left hydroureter. No urolithiasis. No solid mass.  Bladder:  Appears normal for degree of bladder distention. No ureteral jets are noted.  IMPRESSION: 1. Moderate left hydroureteronephrosis.  No urolithiasis.   Electronically Signed By: Elige Ko On: 08/05/2017 17:02  No valid procedures specified. No results found for this or any previous visit.  No results found for this or any previous visit.   Assessment & Plan:    1. Balanitis Clotrimazole prn   No follow-ups on file.  Wilkie Aye, MD  [facility] [place]

## 2023-03-01 ENCOUNTER — Other Ambulatory Visit: Payer: Self-pay

## 2023-03-02 ENCOUNTER — Encounter: Payer: Self-pay | Admitting: Urology

## 2023-03-02 NOTE — Patient Instructions (Signed)
Balanitis  Balanitis is swelling and irritation of the head of the penis (glans penis). Balanitis occurs most often among males who have not had their foreskin removed (uncircumcised). In uncircumcised males, the condition may also cause inflammation of the skin around the foreskin. Balanitis sometimes causes scarring of the penis or foreskin, which can require surgery. This condition may develop because of an infection or another medical condition. Untreated balanitis can increase the risk of penile cancer. What are the causes? Common causes of this condition include: Irritation and lack of airflow due to fluid (smegma) that can build up on the glans penis. Poor personal hygiene, especially in uncircumcised males. Not cleaning the glans penis and foreskin well can result in a buildup of bacteria, viruses, and yeast, which can lead to infection and inflammation. Other causes include: Chemical irritation from products such as soaps or shower gels, especially those that have fragrance. Chemical irritation can also be caused by condoms, personal lubricants, petroleum jelly, spermicides, fabric softeners, or laundry detergents. Skin conditions, such as eczema, dermatitis, and psoriasis. Allergies to medicines, such as tetracycline and sulfa drugs. What increases the risk? The following factors may make you more likely to develop this condition: Being an uncircumcised male. Having diabetes. Having other medical conditions, including liver cirrhosis, congestive heart failure, or kidney disease. Having infections, such as candidiasis, HPV (human papillomavirus), herpes simplex, gonorrhea, or syphilis. Having a tight foreskin that is difficult to pull back (retract) past the glans penis. Being severely obese. History of reactive arthritis. What are the signs or symptoms? Symptoms of this condition include: Discharge from under the foreskin, and pain or difficulty retracting the foreskin. A bad smell  or itchiness on the penis. Tenderness, redness, and swelling of the glans penis. A rash or sores on the glans penis or foreskin. Inability to get an erection due to pain. Trouble urinating. Scarring of the penis or foreskin, in some cases. How is this diagnosed? This condition may be diagnosed based on a physical exam and tests of a swab of discharge to check for bacterial or fungal infection. You may also have blood tests to check for: Viruses that can cause balanitis. A high blood sugar (glucose) level. This could be a sign of diabetes, which can increase the risk of balanitis. How is this treated? Treatment for this condition depends on the cause. Treatment may include: Improving personal hygiene. Your health care provider may recommend sitting in a bath of warm water that is deep enough to cover your hips and buttocks (sitz bath). Medicines such as: Creams or ointments to reduce swelling (steroids) or to treat an infection. Antibiotic medicine. Antifungal medicine. Having surgery to remove or cut the foreskin (circumcision). This may be done if you have scarring on the foreskin that makes it difficult to retract. Controlling other medical problems that may be causing your condition or making it worse. Follow these instructions at home: Medicines Take over-the-counter and prescription medicines only as told by your health care provider. If you were prescribed an antibiotic medicine, use it as told by your health care provider. Do not stop using the antibiotic even if you start to feel better. General instructions Do not have sex until the condition clears up, or until your health care provider approves. Keep your penis clean and dry. Take sitz baths as recommended by your health care provider. Avoid products that irritate your skin or make symptoms worse, such as soaps and shower gels that have fragrance. Keep all follow-up visits. This is  important. Contact a health care provider  if: Your symptoms get worse or do not improve with home care. You develop chills or a fever. You have trouble urinating. You cannot retract your foreskin. Get help right away if: You develop severe pain. You are unable to urinate. Summary Balanitis is swelling and irritation of the head of the penis (glans penis). This condition is most common among uncircumcised males. Balanitis causes pain, redness, and swelling of the glans penis. Good personal hygiene is important. Treatment may include improving personal hygiene and applying creams or ointments. Contact a health care provider if your symptoms get worse or do not improve with home care. This information is not intended to replace advice given to you by your health care provider. Make sure you discuss any questions you have with your health care provider. Document Revised: 10/04/2020 Document Reviewed: 10/04/2020 Elsevier Patient Education  2024 [employer].

## 2023-03-04 DIAGNOSIS — L03116 Cellulitis of left lower limb: Secondary | ICD-10-CM | POA: Diagnosis not present

## 2023-03-04 DIAGNOSIS — J45998 Other asthma: Secondary | ICD-10-CM | POA: Diagnosis not present

## 2023-03-04 DIAGNOSIS — G9341 Metabolic encephalopathy: Secondary | ICD-10-CM | POA: Diagnosis not present

## 2023-03-04 DIAGNOSIS — C9000 Multiple myeloma not having achieved remission: Secondary | ICD-10-CM | POA: Diagnosis not present

## 2023-03-04 DIAGNOSIS — I509 Heart failure, unspecified: Secondary | ICD-10-CM | POA: Diagnosis not present

## 2023-03-04 DIAGNOSIS — I110 Hypertensive heart disease with heart failure: Secondary | ICD-10-CM | POA: Diagnosis not present

## 2023-03-10 DIAGNOSIS — Z1283 Encounter for screening for malignant neoplasm of skin: Secondary | ICD-10-CM | POA: Diagnosis not present

## 2023-03-10 DIAGNOSIS — Z08 Encounter for follow-up examination after completed treatment for malignant neoplasm: Secondary | ICD-10-CM | POA: Diagnosis not present

## 2023-03-10 DIAGNOSIS — D225 Melanocytic nevi of trunk: Secondary | ICD-10-CM | POA: Diagnosis not present

## 2023-03-10 DIAGNOSIS — L820 Inflamed seborrheic keratosis: Secondary | ICD-10-CM | POA: Diagnosis not present

## 2023-03-10 DIAGNOSIS — Z85820 Personal history of malignant melanoma of skin: Secondary | ICD-10-CM | POA: Diagnosis not present

## 2023-03-11 DIAGNOSIS — G9341 Metabolic encephalopathy: Secondary | ICD-10-CM | POA: Diagnosis not present

## 2023-03-11 DIAGNOSIS — L03116 Cellulitis of left lower limb: Secondary | ICD-10-CM | POA: Diagnosis not present

## 2023-03-11 DIAGNOSIS — I110 Hypertensive heart disease with heart failure: Secondary | ICD-10-CM | POA: Diagnosis not present

## 2023-03-11 DIAGNOSIS — I509 Heart failure, unspecified: Secondary | ICD-10-CM | POA: Diagnosis not present

## 2023-03-11 DIAGNOSIS — J45998 Other asthma: Secondary | ICD-10-CM | POA: Diagnosis not present

## 2023-03-11 DIAGNOSIS — C9000 Multiple myeloma not having achieved remission: Secondary | ICD-10-CM | POA: Diagnosis not present

## 2023-03-12 DIAGNOSIS — Z515 Encounter for palliative care: Secondary | ICD-10-CM | POA: Diagnosis not present

## 2023-03-12 DIAGNOSIS — C9000 Multiple myeloma not having achieved remission: Secondary | ICD-10-CM | POA: Diagnosis not present

## 2023-03-14 DIAGNOSIS — K219 Gastro-esophageal reflux disease without esophagitis: Secondary | ICD-10-CM | POA: Diagnosis not present

## 2023-03-14 DIAGNOSIS — J3089 Other allergic rhinitis: Secondary | ICD-10-CM | POA: Diagnosis not present

## 2023-03-14 DIAGNOSIS — K602 Anal fissure, unspecified: Secondary | ICD-10-CM | POA: Diagnosis not present

## 2023-03-14 DIAGNOSIS — I519 Heart disease, unspecified: Secondary | ICD-10-CM | POA: Diagnosis not present

## 2023-03-14 DIAGNOSIS — I509 Heart failure, unspecified: Secondary | ICD-10-CM | POA: Diagnosis not present

## 2023-03-14 DIAGNOSIS — E039 Hypothyroidism, unspecified: Secondary | ICD-10-CM | POA: Diagnosis not present

## 2023-03-14 DIAGNOSIS — G311 Senile degeneration of brain, not elsewhere classified: Secondary | ICD-10-CM | POA: Diagnosis not present

## 2023-03-14 DIAGNOSIS — H409 Unspecified glaucoma: Secondary | ICD-10-CM | POA: Diagnosis not present

## 2023-03-14 DIAGNOSIS — D860 Sarcoidosis of lung: Secondary | ICD-10-CM | POA: Diagnosis not present

## 2023-03-14 DIAGNOSIS — C9000 Multiple myeloma not having achieved remission: Secondary | ICD-10-CM | POA: Diagnosis not present

## 2023-03-14 DIAGNOSIS — H3530 Unspecified macular degeneration: Secondary | ICD-10-CM | POA: Diagnosis not present

## 2023-03-14 DIAGNOSIS — J45909 Unspecified asthma, uncomplicated: Secondary | ICD-10-CM | POA: Diagnosis not present

## 2023-03-14 DIAGNOSIS — I10 Essential (primary) hypertension: Secondary | ICD-10-CM | POA: Diagnosis not present

## 2023-03-17 ENCOUNTER — Encounter: Payer: Self-pay | Admitting: Hematology

## 2023-03-17 DIAGNOSIS — I509 Heart failure, unspecified: Secondary | ICD-10-CM | POA: Diagnosis not present

## 2023-03-17 DIAGNOSIS — C9000 Multiple myeloma not having achieved remission: Secondary | ICD-10-CM | POA: Diagnosis not present

## 2023-03-17 DIAGNOSIS — G311 Senile degeneration of brain, not elsewhere classified: Secondary | ICD-10-CM | POA: Diagnosis not present

## 2023-03-17 DIAGNOSIS — J3089 Other allergic rhinitis: Secondary | ICD-10-CM | POA: Diagnosis not present

## 2023-03-17 DIAGNOSIS — K602 Anal fissure, unspecified: Secondary | ICD-10-CM | POA: Diagnosis not present

## 2023-03-17 DIAGNOSIS — J45909 Unspecified asthma, uncomplicated: Secondary | ICD-10-CM | POA: Diagnosis not present

## 2023-03-18 DIAGNOSIS — J45909 Unspecified asthma, uncomplicated: Secondary | ICD-10-CM | POA: Diagnosis not present

## 2023-03-18 DIAGNOSIS — G311 Senile degeneration of brain, not elsewhere classified: Secondary | ICD-10-CM | POA: Diagnosis not present

## 2023-03-18 DIAGNOSIS — I509 Heart failure, unspecified: Secondary | ICD-10-CM | POA: Diagnosis not present

## 2023-03-18 DIAGNOSIS — C9000 Multiple myeloma not having achieved remission: Secondary | ICD-10-CM | POA: Diagnosis not present

## 2023-03-18 DIAGNOSIS — J3089 Other allergic rhinitis: Secondary | ICD-10-CM | POA: Diagnosis not present

## 2023-03-18 DIAGNOSIS — K602 Anal fissure, unspecified: Secondary | ICD-10-CM | POA: Diagnosis not present

## 2023-03-19 DIAGNOSIS — C9000 Multiple myeloma not having achieved remission: Secondary | ICD-10-CM | POA: Diagnosis not present

## 2023-03-19 DIAGNOSIS — K602 Anal fissure, unspecified: Secondary | ICD-10-CM | POA: Diagnosis not present

## 2023-03-19 DIAGNOSIS — J45909 Unspecified asthma, uncomplicated: Secondary | ICD-10-CM | POA: Diagnosis not present

## 2023-03-19 DIAGNOSIS — J3089 Other allergic rhinitis: Secondary | ICD-10-CM | POA: Diagnosis not present

## 2023-03-19 DIAGNOSIS — I509 Heart failure, unspecified: Secondary | ICD-10-CM | POA: Diagnosis not present

## 2023-03-19 DIAGNOSIS — G311 Senile degeneration of brain, not elsewhere classified: Secondary | ICD-10-CM | POA: Diagnosis not present

## 2023-03-20 DIAGNOSIS — J45909 Unspecified asthma, uncomplicated: Secondary | ICD-10-CM | POA: Diagnosis not present

## 2023-03-20 DIAGNOSIS — C9000 Multiple myeloma not having achieved remission: Secondary | ICD-10-CM | POA: Diagnosis not present

## 2023-03-20 DIAGNOSIS — J3089 Other allergic rhinitis: Secondary | ICD-10-CM | POA: Diagnosis not present

## 2023-03-20 DIAGNOSIS — K602 Anal fissure, unspecified: Secondary | ICD-10-CM | POA: Diagnosis not present

## 2023-03-20 DIAGNOSIS — G311 Senile degeneration of brain, not elsewhere classified: Secondary | ICD-10-CM | POA: Diagnosis not present

## 2023-03-20 DIAGNOSIS — I509 Heart failure, unspecified: Secondary | ICD-10-CM | POA: Diagnosis not present

## 2023-03-24 DIAGNOSIS — J45909 Unspecified asthma, uncomplicated: Secondary | ICD-10-CM | POA: Diagnosis not present

## 2023-03-24 DIAGNOSIS — C9000 Multiple myeloma not having achieved remission: Secondary | ICD-10-CM | POA: Diagnosis not present

## 2023-03-24 DIAGNOSIS — G311 Senile degeneration of brain, not elsewhere classified: Secondary | ICD-10-CM | POA: Diagnosis not present

## 2023-03-24 DIAGNOSIS — I509 Heart failure, unspecified: Secondary | ICD-10-CM | POA: Diagnosis not present

## 2023-03-24 DIAGNOSIS — K602 Anal fissure, unspecified: Secondary | ICD-10-CM | POA: Diagnosis not present

## 2023-03-24 DIAGNOSIS — J3089 Other allergic rhinitis: Secondary | ICD-10-CM | POA: Diagnosis not present

## 2023-03-26 DIAGNOSIS — I509 Heart failure, unspecified: Secondary | ICD-10-CM | POA: Diagnosis not present

## 2023-03-26 DIAGNOSIS — J3089 Other allergic rhinitis: Secondary | ICD-10-CM | POA: Diagnosis not present

## 2023-03-26 DIAGNOSIS — C9000 Multiple myeloma not having achieved remission: Secondary | ICD-10-CM | POA: Diagnosis not present

## 2023-03-26 DIAGNOSIS — K602 Anal fissure, unspecified: Secondary | ICD-10-CM | POA: Diagnosis not present

## 2023-03-26 DIAGNOSIS — G311 Senile degeneration of brain, not elsewhere classified: Secondary | ICD-10-CM | POA: Diagnosis not present

## 2023-03-26 DIAGNOSIS — J45909 Unspecified asthma, uncomplicated: Secondary | ICD-10-CM | POA: Diagnosis not present

## 2023-03-27 DIAGNOSIS — C9000 Multiple myeloma not having achieved remission: Secondary | ICD-10-CM | POA: Diagnosis not present

## 2023-03-27 DIAGNOSIS — J45909 Unspecified asthma, uncomplicated: Secondary | ICD-10-CM | POA: Diagnosis not present

## 2023-03-27 DIAGNOSIS — K602 Anal fissure, unspecified: Secondary | ICD-10-CM | POA: Diagnosis not present

## 2023-03-27 DIAGNOSIS — I509 Heart failure, unspecified: Secondary | ICD-10-CM | POA: Diagnosis not present

## 2023-03-27 DIAGNOSIS — G311 Senile degeneration of brain, not elsewhere classified: Secondary | ICD-10-CM | POA: Diagnosis not present

## 2023-03-27 DIAGNOSIS — J3089 Other allergic rhinitis: Secondary | ICD-10-CM | POA: Diagnosis not present

## 2023-03-31 DIAGNOSIS — I509 Heart failure, unspecified: Secondary | ICD-10-CM | POA: Diagnosis not present

## 2023-03-31 DIAGNOSIS — G311 Senile degeneration of brain, not elsewhere classified: Secondary | ICD-10-CM | POA: Diagnosis not present

## 2023-03-31 DIAGNOSIS — K219 Gastro-esophageal reflux disease without esophagitis: Secondary | ICD-10-CM | POA: Diagnosis not present

## 2023-03-31 DIAGNOSIS — C9000 Multiple myeloma not having achieved remission: Secondary | ICD-10-CM | POA: Diagnosis not present

## 2023-03-31 DIAGNOSIS — E039 Hypothyroidism, unspecified: Secondary | ICD-10-CM | POA: Diagnosis not present

## 2023-03-31 DIAGNOSIS — F4321 Adjustment disorder with depressed mood: Secondary | ICD-10-CM | POA: Diagnosis not present

## 2023-03-31 DIAGNOSIS — D519 Vitamin B12 deficiency anemia, unspecified: Secondary | ICD-10-CM | POA: Diagnosis not present

## 2023-03-31 DIAGNOSIS — K602 Anal fissure, unspecified: Secondary | ICD-10-CM | POA: Diagnosis not present

## 2023-03-31 DIAGNOSIS — H409 Unspecified glaucoma: Secondary | ICD-10-CM | POA: Diagnosis not present

## 2023-03-31 DIAGNOSIS — J45909 Unspecified asthma, uncomplicated: Secondary | ICD-10-CM | POA: Diagnosis not present

## 2023-03-31 DIAGNOSIS — J3089 Other allergic rhinitis: Secondary | ICD-10-CM | POA: Diagnosis not present

## 2023-03-31 DIAGNOSIS — I110 Hypertensive heart disease with heart failure: Secondary | ICD-10-CM | POA: Diagnosis not present

## 2023-03-31 DIAGNOSIS — I209 Angina pectoris, unspecified: Secondary | ICD-10-CM | POA: Diagnosis not present

## 2023-04-01 DIAGNOSIS — J3089 Other allergic rhinitis: Secondary | ICD-10-CM | POA: Diagnosis not present

## 2023-04-01 DIAGNOSIS — G311 Senile degeneration of brain, not elsewhere classified: Secondary | ICD-10-CM | POA: Diagnosis not present

## 2023-04-01 DIAGNOSIS — K602 Anal fissure, unspecified: Secondary | ICD-10-CM | POA: Diagnosis not present

## 2023-04-01 DIAGNOSIS — J45909 Unspecified asthma, uncomplicated: Secondary | ICD-10-CM | POA: Diagnosis not present

## 2023-04-01 DIAGNOSIS — C9000 Multiple myeloma not having achieved remission: Secondary | ICD-10-CM | POA: Diagnosis not present

## 2023-04-01 DIAGNOSIS — F4321 Adjustment disorder with depressed mood: Secondary | ICD-10-CM | POA: Diagnosis not present

## 2023-04-01 DIAGNOSIS — I509 Heart failure, unspecified: Secondary | ICD-10-CM | POA: Diagnosis not present

## 2023-04-02 DIAGNOSIS — J45909 Unspecified asthma, uncomplicated: Secondary | ICD-10-CM | POA: Diagnosis not present

## 2023-04-02 DIAGNOSIS — G311 Senile degeneration of brain, not elsewhere classified: Secondary | ICD-10-CM | POA: Diagnosis not present

## 2023-04-02 DIAGNOSIS — J3089 Other allergic rhinitis: Secondary | ICD-10-CM | POA: Diagnosis not present

## 2023-04-02 DIAGNOSIS — K602 Anal fissure, unspecified: Secondary | ICD-10-CM | POA: Diagnosis not present

## 2023-04-02 DIAGNOSIS — I509 Heart failure, unspecified: Secondary | ICD-10-CM | POA: Diagnosis not present

## 2023-04-02 DIAGNOSIS — C9000 Multiple myeloma not having achieved remission: Secondary | ICD-10-CM | POA: Diagnosis not present

## 2023-04-06 DIAGNOSIS — C9000 Multiple myeloma not having achieved remission: Secondary | ICD-10-CM | POA: Diagnosis not present

## 2023-04-06 DIAGNOSIS — J45909 Unspecified asthma, uncomplicated: Secondary | ICD-10-CM | POA: Diagnosis not present

## 2023-04-06 DIAGNOSIS — H409 Unspecified glaucoma: Secondary | ICD-10-CM | POA: Diagnosis not present

## 2023-04-06 DIAGNOSIS — D860 Sarcoidosis of lung: Secondary | ICD-10-CM | POA: Diagnosis not present

## 2023-04-06 DIAGNOSIS — I509 Heart failure, unspecified: Secondary | ICD-10-CM | POA: Diagnosis not present

## 2023-04-06 DIAGNOSIS — G311 Senile degeneration of brain, not elsewhere classified: Secondary | ICD-10-CM | POA: Diagnosis not present

## 2023-04-06 DIAGNOSIS — K602 Anal fissure, unspecified: Secondary | ICD-10-CM | POA: Diagnosis not present

## 2023-04-06 DIAGNOSIS — E039 Hypothyroidism, unspecified: Secondary | ICD-10-CM | POA: Diagnosis not present

## 2023-04-06 DIAGNOSIS — I519 Heart disease, unspecified: Secondary | ICD-10-CM | POA: Diagnosis not present

## 2023-04-06 DIAGNOSIS — I10 Essential (primary) hypertension: Secondary | ICD-10-CM | POA: Diagnosis not present

## 2023-04-06 DIAGNOSIS — J3089 Other allergic rhinitis: Secondary | ICD-10-CM | POA: Diagnosis not present

## 2023-04-06 DIAGNOSIS — K219 Gastro-esophageal reflux disease without esophagitis: Secondary | ICD-10-CM | POA: Diagnosis not present

## 2023-04-06 DIAGNOSIS — H3530 Unspecified macular degeneration: Secondary | ICD-10-CM | POA: Diagnosis not present

## 2023-04-07 DIAGNOSIS — J3089 Other allergic rhinitis: Secondary | ICD-10-CM | POA: Diagnosis not present

## 2023-04-07 DIAGNOSIS — K602 Anal fissure, unspecified: Secondary | ICD-10-CM | POA: Diagnosis not present

## 2023-04-07 DIAGNOSIS — B351 Tinea unguium: Secondary | ICD-10-CM | POA: Diagnosis not present

## 2023-04-07 DIAGNOSIS — J45909 Unspecified asthma, uncomplicated: Secondary | ICD-10-CM | POA: Diagnosis not present

## 2023-04-07 DIAGNOSIS — I509 Heart failure, unspecified: Secondary | ICD-10-CM | POA: Diagnosis not present

## 2023-04-07 DIAGNOSIS — I739 Peripheral vascular disease, unspecified: Secondary | ICD-10-CM | POA: Diagnosis not present

## 2023-04-07 DIAGNOSIS — G311 Senile degeneration of brain, not elsewhere classified: Secondary | ICD-10-CM | POA: Diagnosis not present

## 2023-04-07 DIAGNOSIS — C9000 Multiple myeloma not having achieved remission: Secondary | ICD-10-CM | POA: Diagnosis not present

## 2023-04-08 ENCOUNTER — Inpatient Hospital Stay: Payer: No Typology Code available for payment source

## 2023-04-08 DIAGNOSIS — F4321 Adjustment disorder with depressed mood: Secondary | ICD-10-CM | POA: Diagnosis not present

## 2023-04-09 DIAGNOSIS — G311 Senile degeneration of brain, not elsewhere classified: Secondary | ICD-10-CM | POA: Diagnosis not present

## 2023-04-09 DIAGNOSIS — C9000 Multiple myeloma not having achieved remission: Secondary | ICD-10-CM | POA: Diagnosis not present

## 2023-04-09 DIAGNOSIS — K602 Anal fissure, unspecified: Secondary | ICD-10-CM | POA: Diagnosis not present

## 2023-04-09 DIAGNOSIS — J3089 Other allergic rhinitis: Secondary | ICD-10-CM | POA: Diagnosis not present

## 2023-04-09 DIAGNOSIS — I509 Heart failure, unspecified: Secondary | ICD-10-CM | POA: Diagnosis not present

## 2023-04-09 DIAGNOSIS — J45909 Unspecified asthma, uncomplicated: Secondary | ICD-10-CM | POA: Diagnosis not present

## 2023-04-14 DIAGNOSIS — K602 Anal fissure, unspecified: Secondary | ICD-10-CM | POA: Diagnosis not present

## 2023-04-14 DIAGNOSIS — C9000 Multiple myeloma not having achieved remission: Secondary | ICD-10-CM | POA: Diagnosis not present

## 2023-04-14 DIAGNOSIS — J3089 Other allergic rhinitis: Secondary | ICD-10-CM | POA: Diagnosis not present

## 2023-04-14 DIAGNOSIS — R600 Localized edema: Secondary | ICD-10-CM | POA: Diagnosis not present

## 2023-04-14 DIAGNOSIS — J45909 Unspecified asthma, uncomplicated: Secondary | ICD-10-CM | POA: Diagnosis not present

## 2023-04-14 DIAGNOSIS — G311 Senile degeneration of brain, not elsewhere classified: Secondary | ICD-10-CM | POA: Diagnosis not present

## 2023-04-14 DIAGNOSIS — G894 Chronic pain syndrome: Secondary | ICD-10-CM | POA: Diagnosis not present

## 2023-04-14 DIAGNOSIS — I509 Heart failure, unspecified: Secondary | ICD-10-CM | POA: Diagnosis not present

## 2023-04-15 ENCOUNTER — Ambulatory Visit: Payer: Medicare Other | Admitting: Hematology

## 2023-04-16 ENCOUNTER — Inpatient Hospital Stay: Payer: No Typology Code available for payment source | Admitting: Hematology

## 2023-04-16 DIAGNOSIS — K602 Anal fissure, unspecified: Secondary | ICD-10-CM | POA: Diagnosis not present

## 2023-04-16 DIAGNOSIS — J45909 Unspecified asthma, uncomplicated: Secondary | ICD-10-CM | POA: Diagnosis not present

## 2023-04-16 DIAGNOSIS — I509 Heart failure, unspecified: Secondary | ICD-10-CM | POA: Diagnosis not present

## 2023-04-16 DIAGNOSIS — J3089 Other allergic rhinitis: Secondary | ICD-10-CM | POA: Diagnosis not present

## 2023-04-16 DIAGNOSIS — C9000 Multiple myeloma not having achieved remission: Secondary | ICD-10-CM | POA: Diagnosis not present

## 2023-04-16 DIAGNOSIS — G311 Senile degeneration of brain, not elsewhere classified: Secondary | ICD-10-CM | POA: Diagnosis not present

## 2023-04-21 DIAGNOSIS — G311 Senile degeneration of brain, not elsewhere classified: Secondary | ICD-10-CM | POA: Diagnosis not present

## 2023-04-21 DIAGNOSIS — J3089 Other allergic rhinitis: Secondary | ICD-10-CM | POA: Diagnosis not present

## 2023-04-21 DIAGNOSIS — J45909 Unspecified asthma, uncomplicated: Secondary | ICD-10-CM | POA: Diagnosis not present

## 2023-04-21 DIAGNOSIS — C9000 Multiple myeloma not having achieved remission: Secondary | ICD-10-CM | POA: Diagnosis not present

## 2023-04-21 DIAGNOSIS — I509 Heart failure, unspecified: Secondary | ICD-10-CM | POA: Diagnosis not present

## 2023-04-21 DIAGNOSIS — K602 Anal fissure, unspecified: Secondary | ICD-10-CM | POA: Diagnosis not present

## 2023-04-22 DIAGNOSIS — F4321 Adjustment disorder with depressed mood: Secondary | ICD-10-CM | POA: Diagnosis not present

## 2023-04-23 ENCOUNTER — Other Ambulatory Visit (HOSPITAL_COMMUNITY)
Admission: RE | Admit: 2023-04-23 | Discharge: 2023-04-23 | Disposition: A | Discharge status: Home / Self Care | Payer: Medicare Other | Source: Other Acute Inpatient Hospital | Attending: Urology | Admitting: Urology

## 2023-04-23 DIAGNOSIS — N390 Urinary tract infection, site not specified: Secondary | ICD-10-CM | POA: Diagnosis present

## 2023-04-23 DIAGNOSIS — G311 Senile degeneration of brain, not elsewhere classified: Secondary | ICD-10-CM | POA: Diagnosis not present

## 2023-04-23 DIAGNOSIS — C649 Malignant neoplasm of unspecified kidney, except renal pelvis: Secondary | ICD-10-CM | POA: Diagnosis not present

## 2023-04-23 DIAGNOSIS — C9000 Multiple myeloma not having achieved remission: Secondary | ICD-10-CM | POA: Diagnosis not present

## 2023-04-23 DIAGNOSIS — K602 Anal fissure, unspecified: Secondary | ICD-10-CM | POA: Diagnosis not present

## 2023-04-23 DIAGNOSIS — I509 Heart failure, unspecified: Secondary | ICD-10-CM | POA: Diagnosis not present

## 2023-04-23 DIAGNOSIS — J45909 Unspecified asthma, uncomplicated: Secondary | ICD-10-CM | POA: Diagnosis not present

## 2023-04-23 DIAGNOSIS — J3089 Other allergic rhinitis: Secondary | ICD-10-CM | POA: Diagnosis not present

## 2023-04-23 LAB — URINALYSIS, W/ REFLEX TO CULTURE (INFECTION SUSPECTED)
Bacteria, UA: NONE SEEN
Bilirubin Urine: NEGATIVE
Glucose, UA: NEGATIVE mg/dL
Hgb urine dipstick: NEGATIVE
Ketones, ur: NEGATIVE mg/dL
Leukocytes,Ua: NEGATIVE
Nitrite: NEGATIVE
Protein, ur: NEGATIVE mg/dL
Specific Gravity, Urine: 1.009 (ref 1.005–1.030)
pH: 6 (ref 5.0–8.0)

## 2023-04-24 ENCOUNTER — Ambulatory Visit: Payer: Medicare Other | Admitting: Physician Assistant

## 2023-04-24 LAB — URINE CULTURE: Culture: <10000 — AB

## 2023-04-25 DIAGNOSIS — K602 Anal fissure, unspecified: Secondary | ICD-10-CM | POA: Diagnosis not present

## 2023-04-25 DIAGNOSIS — I509 Heart failure, unspecified: Secondary | ICD-10-CM | POA: Diagnosis not present

## 2023-04-25 DIAGNOSIS — J45909 Unspecified asthma, uncomplicated: Secondary | ICD-10-CM | POA: Diagnosis not present

## 2023-04-25 DIAGNOSIS — J3089 Other allergic rhinitis: Secondary | ICD-10-CM | POA: Diagnosis not present

## 2023-04-25 DIAGNOSIS — G311 Senile degeneration of brain, not elsewhere classified: Secondary | ICD-10-CM | POA: Diagnosis not present

## 2023-04-25 DIAGNOSIS — C9000 Multiple myeloma not having achieved remission: Secondary | ICD-10-CM | POA: Diagnosis not present

## 2023-04-28 DIAGNOSIS — G311 Senile degeneration of brain, not elsewhere classified: Secondary | ICD-10-CM | POA: Diagnosis not present

## 2023-04-28 DIAGNOSIS — J45909 Unspecified asthma, uncomplicated: Secondary | ICD-10-CM | POA: Diagnosis not present

## 2023-04-28 DIAGNOSIS — J3089 Other allergic rhinitis: Secondary | ICD-10-CM | POA: Diagnosis not present

## 2023-04-28 DIAGNOSIS — I209 Angina pectoris, unspecified: Secondary | ICD-10-CM | POA: Diagnosis not present

## 2023-04-28 DIAGNOSIS — K602 Anal fissure, unspecified: Secondary | ICD-10-CM | POA: Diagnosis not present

## 2023-04-28 DIAGNOSIS — E039 Hypothyroidism, unspecified: Secondary | ICD-10-CM | POA: Diagnosis not present

## 2023-04-28 DIAGNOSIS — R600 Localized edema: Secondary | ICD-10-CM | POA: Diagnosis not present

## 2023-04-28 DIAGNOSIS — F4321 Adjustment disorder with depressed mood: Secondary | ICD-10-CM | POA: Diagnosis not present

## 2023-04-28 DIAGNOSIS — C9000 Multiple myeloma not having achieved remission: Secondary | ICD-10-CM | POA: Diagnosis not present

## 2023-04-28 DIAGNOSIS — I509 Heart failure, unspecified: Secondary | ICD-10-CM | POA: Diagnosis not present

## 2023-05-01 DIAGNOSIS — G894 Chronic pain syndrome: Secondary | ICD-10-CM | POA: Diagnosis not present

## 2023-05-01 DIAGNOSIS — F0390 Unspecified dementia without behavioral disturbance: Secondary | ICD-10-CM | POA: Diagnosis not present

## 2023-05-01 DIAGNOSIS — F4321 Adjustment disorder with depressed mood: Secondary | ICD-10-CM | POA: Diagnosis not present

## 2023-05-01 DIAGNOSIS — F5102 Adjustment insomnia: Secondary | ICD-10-CM | POA: Diagnosis not present

## 2023-05-05 DIAGNOSIS — G311 Senile degeneration of brain, not elsewhere classified: Secondary | ICD-10-CM | POA: Diagnosis not present

## 2023-05-05 DIAGNOSIS — F4321 Adjustment disorder with depressed mood: Secondary | ICD-10-CM | POA: Diagnosis not present

## 2023-05-05 DIAGNOSIS — J3089 Other allergic rhinitis: Secondary | ICD-10-CM | POA: Diagnosis not present

## 2023-05-05 DIAGNOSIS — I509 Heart failure, unspecified: Secondary | ICD-10-CM | POA: Diagnosis not present

## 2023-05-05 DIAGNOSIS — J45909 Unspecified asthma, uncomplicated: Secondary | ICD-10-CM | POA: Diagnosis not present

## 2023-05-05 DIAGNOSIS — K602 Anal fissure, unspecified: Secondary | ICD-10-CM | POA: Diagnosis not present

## 2023-05-05 DIAGNOSIS — C9000 Multiple myeloma not having achieved remission: Secondary | ICD-10-CM | POA: Diagnosis not present

## 2023-05-07 DIAGNOSIS — K602 Anal fissure, unspecified: Secondary | ICD-10-CM | POA: Diagnosis not present

## 2023-05-07 DIAGNOSIS — J3089 Other allergic rhinitis: Secondary | ICD-10-CM | POA: Diagnosis not present

## 2023-05-07 DIAGNOSIS — I519 Heart disease, unspecified: Secondary | ICD-10-CM | POA: Diagnosis not present

## 2023-05-07 DIAGNOSIS — C9000 Multiple myeloma not having achieved remission: Secondary | ICD-10-CM | POA: Diagnosis not present

## 2023-05-07 DIAGNOSIS — H409 Unspecified glaucoma: Secondary | ICD-10-CM | POA: Diagnosis not present

## 2023-05-07 DIAGNOSIS — E039 Hypothyroidism, unspecified: Secondary | ICD-10-CM | POA: Diagnosis not present

## 2023-05-07 DIAGNOSIS — I509 Heart failure, unspecified: Secondary | ICD-10-CM | POA: Diagnosis not present

## 2023-05-07 DIAGNOSIS — J45909 Unspecified asthma, uncomplicated: Secondary | ICD-10-CM | POA: Diagnosis not present

## 2023-05-07 DIAGNOSIS — I10 Essential (primary) hypertension: Secondary | ICD-10-CM | POA: Diagnosis not present

## 2023-05-07 DIAGNOSIS — H3530 Unspecified macular degeneration: Secondary | ICD-10-CM | POA: Diagnosis not present

## 2023-05-07 DIAGNOSIS — K219 Gastro-esophageal reflux disease without esophagitis: Secondary | ICD-10-CM | POA: Diagnosis not present

## 2023-05-07 DIAGNOSIS — D860 Sarcoidosis of lung: Secondary | ICD-10-CM | POA: Diagnosis not present

## 2023-05-07 DIAGNOSIS — G311 Senile degeneration of brain, not elsewhere classified: Secondary | ICD-10-CM | POA: Diagnosis not present

## 2023-05-13 DIAGNOSIS — J45909 Unspecified asthma, uncomplicated: Secondary | ICD-10-CM | POA: Diagnosis not present

## 2023-05-13 DIAGNOSIS — I509 Heart failure, unspecified: Secondary | ICD-10-CM | POA: Diagnosis not present

## 2023-05-13 DIAGNOSIS — J3089 Other allergic rhinitis: Secondary | ICD-10-CM | POA: Diagnosis not present

## 2023-05-13 DIAGNOSIS — K602 Anal fissure, unspecified: Secondary | ICD-10-CM | POA: Diagnosis not present

## 2023-05-13 DIAGNOSIS — C9000 Multiple myeloma not having achieved remission: Secondary | ICD-10-CM | POA: Diagnosis not present

## 2023-05-13 DIAGNOSIS — G311 Senile degeneration of brain, not elsewhere classified: Secondary | ICD-10-CM | POA: Diagnosis not present

## 2023-05-14 DIAGNOSIS — J3089 Other allergic rhinitis: Secondary | ICD-10-CM | POA: Diagnosis not present

## 2023-05-14 DIAGNOSIS — I509 Heart failure, unspecified: Secondary | ICD-10-CM | POA: Diagnosis not present

## 2023-05-14 DIAGNOSIS — C9000 Multiple myeloma not having achieved remission: Secondary | ICD-10-CM | POA: Diagnosis not present

## 2023-05-14 DIAGNOSIS — R0782 Intercostal pain: Secondary | ICD-10-CM | POA: Diagnosis not present

## 2023-05-14 DIAGNOSIS — G311 Senile degeneration of brain, not elsewhere classified: Secondary | ICD-10-CM | POA: Diagnosis not present

## 2023-05-14 DIAGNOSIS — J45909 Unspecified asthma, uncomplicated: Secondary | ICD-10-CM | POA: Diagnosis not present

## 2023-05-14 DIAGNOSIS — K602 Anal fissure, unspecified: Secondary | ICD-10-CM | POA: Diagnosis not present

## 2023-05-15 DIAGNOSIS — J3089 Other allergic rhinitis: Secondary | ICD-10-CM | POA: Diagnosis not present

## 2023-05-15 DIAGNOSIS — K602 Anal fissure, unspecified: Secondary | ICD-10-CM | POA: Diagnosis not present

## 2023-05-15 DIAGNOSIS — G311 Senile degeneration of brain, not elsewhere classified: Secondary | ICD-10-CM | POA: Diagnosis not present

## 2023-05-15 DIAGNOSIS — J45909 Unspecified asthma, uncomplicated: Secondary | ICD-10-CM | POA: Diagnosis not present

## 2023-05-15 DIAGNOSIS — C9000 Multiple myeloma not having achieved remission: Secondary | ICD-10-CM | POA: Diagnosis not present

## 2023-05-15 DIAGNOSIS — I509 Heart failure, unspecified: Secondary | ICD-10-CM | POA: Diagnosis not present

## 2023-05-19 DIAGNOSIS — K602 Anal fissure, unspecified: Secondary | ICD-10-CM | POA: Diagnosis not present

## 2023-05-19 DIAGNOSIS — J3089 Other allergic rhinitis: Secondary | ICD-10-CM | POA: Diagnosis not present

## 2023-05-19 DIAGNOSIS — I509 Heart failure, unspecified: Secondary | ICD-10-CM | POA: Diagnosis not present

## 2023-05-19 DIAGNOSIS — C9000 Multiple myeloma not having achieved remission: Secondary | ICD-10-CM | POA: Diagnosis not present

## 2023-05-19 DIAGNOSIS — J45909 Unspecified asthma, uncomplicated: Secondary | ICD-10-CM | POA: Diagnosis not present

## 2023-05-19 DIAGNOSIS — G311 Senile degeneration of brain, not elsewhere classified: Secondary | ICD-10-CM | POA: Diagnosis not present

## 2023-05-20 DIAGNOSIS — C9000 Multiple myeloma not having achieved remission: Secondary | ICD-10-CM | POA: Diagnosis not present

## 2023-05-20 DIAGNOSIS — K602 Anal fissure, unspecified: Secondary | ICD-10-CM | POA: Diagnosis not present

## 2023-05-20 DIAGNOSIS — G311 Senile degeneration of brain, not elsewhere classified: Secondary | ICD-10-CM | POA: Diagnosis not present

## 2023-05-20 DIAGNOSIS — J3089 Other allergic rhinitis: Secondary | ICD-10-CM | POA: Diagnosis not present

## 2023-05-20 DIAGNOSIS — J45909 Unspecified asthma, uncomplicated: Secondary | ICD-10-CM | POA: Diagnosis not present

## 2023-05-20 DIAGNOSIS — I509 Heart failure, unspecified: Secondary | ICD-10-CM | POA: Diagnosis not present

## 2023-05-21 DIAGNOSIS — I509 Heart failure, unspecified: Secondary | ICD-10-CM | POA: Diagnosis not present

## 2023-05-21 DIAGNOSIS — J3089 Other allergic rhinitis: Secondary | ICD-10-CM | POA: Diagnosis not present

## 2023-05-21 DIAGNOSIS — K602 Anal fissure, unspecified: Secondary | ICD-10-CM | POA: Diagnosis not present

## 2023-05-21 DIAGNOSIS — C9000 Multiple myeloma not having achieved remission: Secondary | ICD-10-CM | POA: Diagnosis not present

## 2023-05-21 DIAGNOSIS — G311 Senile degeneration of brain, not elsewhere classified: Secondary | ICD-10-CM | POA: Diagnosis not present

## 2023-05-21 DIAGNOSIS — J45909 Unspecified asthma, uncomplicated: Secondary | ICD-10-CM | POA: Diagnosis not present

## 2023-05-23 DIAGNOSIS — I509 Heart failure, unspecified: Secondary | ICD-10-CM | POA: Diagnosis not present

## 2023-05-23 DIAGNOSIS — G4700 Insomnia, unspecified: Secondary | ICD-10-CM | POA: Diagnosis not present

## 2023-05-23 DIAGNOSIS — F0390 Unspecified dementia without behavioral disturbance: Secondary | ICD-10-CM | POA: Diagnosis not present

## 2023-05-26 DIAGNOSIS — C9000 Multiple myeloma not having achieved remission: Secondary | ICD-10-CM | POA: Diagnosis not present

## 2023-05-26 DIAGNOSIS — J3089 Other allergic rhinitis: Secondary | ICD-10-CM | POA: Diagnosis not present

## 2023-05-26 DIAGNOSIS — G311 Senile degeneration of brain, not elsewhere classified: Secondary | ICD-10-CM | POA: Diagnosis not present

## 2023-05-26 DIAGNOSIS — K602 Anal fissure, unspecified: Secondary | ICD-10-CM | POA: Diagnosis not present

## 2023-05-26 DIAGNOSIS — J45909 Unspecified asthma, uncomplicated: Secondary | ICD-10-CM | POA: Diagnosis not present

## 2023-05-26 DIAGNOSIS — I509 Heart failure, unspecified: Secondary | ICD-10-CM | POA: Diagnosis not present

## 2023-05-27 DIAGNOSIS — Z961 Presence of intraocular lens: Secondary | ICD-10-CM | POA: Diagnosis not present

## 2023-05-27 DIAGNOSIS — I509 Heart failure, unspecified: Secondary | ICD-10-CM | POA: Diagnosis not present

## 2023-05-27 DIAGNOSIS — J3089 Other allergic rhinitis: Secondary | ICD-10-CM | POA: Diagnosis not present

## 2023-05-27 DIAGNOSIS — K602 Anal fissure, unspecified: Secondary | ICD-10-CM | POA: Diagnosis not present

## 2023-05-27 DIAGNOSIS — H0102B Squamous blepharitis left eye, upper and lower eyelids: Secondary | ICD-10-CM | POA: Diagnosis not present

## 2023-05-27 DIAGNOSIS — H401113 Primary open-angle glaucoma, right eye, severe stage: Secondary | ICD-10-CM | POA: Diagnosis not present

## 2023-05-27 DIAGNOSIS — H0102A Squamous blepharitis right eye, upper and lower eyelids: Secondary | ICD-10-CM | POA: Diagnosis not present

## 2023-05-27 DIAGNOSIS — I110 Hypertensive heart disease with heart failure: Secondary | ICD-10-CM | POA: Diagnosis not present

## 2023-05-27 DIAGNOSIS — R0782 Intercostal pain: Secondary | ICD-10-CM | POA: Diagnosis not present

## 2023-05-27 DIAGNOSIS — H401121 Primary open-angle glaucoma, left eye, mild stage: Secondary | ICD-10-CM | POA: Diagnosis not present

## 2023-05-27 DIAGNOSIS — G311 Senile degeneration of brain, not elsewhere classified: Secondary | ICD-10-CM | POA: Diagnosis not present

## 2023-05-27 DIAGNOSIS — J45909 Unspecified asthma, uncomplicated: Secondary | ICD-10-CM | POA: Diagnosis not present

## 2023-05-27 DIAGNOSIS — H04123 Dry eye syndrome of bilateral lacrimal glands: Secondary | ICD-10-CM | POA: Diagnosis not present

## 2023-05-27 DIAGNOSIS — C9000 Multiple myeloma not having achieved remission: Secondary | ICD-10-CM | POA: Diagnosis not present

## 2023-06-01 DIAGNOSIS — J3089 Other allergic rhinitis: Secondary | ICD-10-CM | POA: Diagnosis not present

## 2023-06-01 DIAGNOSIS — C9000 Multiple myeloma not having achieved remission: Secondary | ICD-10-CM | POA: Diagnosis not present

## 2023-06-01 DIAGNOSIS — K602 Anal fissure, unspecified: Secondary | ICD-10-CM | POA: Diagnosis not present

## 2023-06-01 DIAGNOSIS — J45909 Unspecified asthma, uncomplicated: Secondary | ICD-10-CM | POA: Diagnosis not present

## 2023-06-01 DIAGNOSIS — G311 Senile degeneration of brain, not elsewhere classified: Secondary | ICD-10-CM | POA: Diagnosis not present

## 2023-06-01 DIAGNOSIS — I509 Heart failure, unspecified: Secondary | ICD-10-CM | POA: Diagnosis not present

## 2023-06-02 DIAGNOSIS — I509 Heart failure, unspecified: Secondary | ICD-10-CM | POA: Diagnosis not present

## 2023-06-02 DIAGNOSIS — C9000 Multiple myeloma not having achieved remission: Secondary | ICD-10-CM | POA: Diagnosis not present

## 2023-06-02 DIAGNOSIS — J45909 Unspecified asthma, uncomplicated: Secondary | ICD-10-CM | POA: Diagnosis not present

## 2023-06-02 DIAGNOSIS — J3089 Other allergic rhinitis: Secondary | ICD-10-CM | POA: Diagnosis not present

## 2023-06-02 DIAGNOSIS — K602 Anal fissure, unspecified: Secondary | ICD-10-CM | POA: Diagnosis not present

## 2023-06-02 DIAGNOSIS — G311 Senile degeneration of brain, not elsewhere classified: Secondary | ICD-10-CM | POA: Diagnosis not present

## 2023-06-03 DIAGNOSIS — K602 Anal fissure, unspecified: Secondary | ICD-10-CM | POA: Diagnosis not present

## 2023-06-03 DIAGNOSIS — F4321 Adjustment disorder with depressed mood: Secondary | ICD-10-CM | POA: Diagnosis not present

## 2023-06-03 DIAGNOSIS — J3089 Other allergic rhinitis: Secondary | ICD-10-CM | POA: Diagnosis not present

## 2023-06-03 DIAGNOSIS — C9000 Multiple myeloma not having achieved remission: Secondary | ICD-10-CM | POA: Diagnosis not present

## 2023-06-03 DIAGNOSIS — I509 Heart failure, unspecified: Secondary | ICD-10-CM | POA: Diagnosis not present

## 2023-06-03 DIAGNOSIS — G311 Senile degeneration of brain, not elsewhere classified: Secondary | ICD-10-CM | POA: Diagnosis not present

## 2023-06-03 DIAGNOSIS — J45909 Unspecified asthma, uncomplicated: Secondary | ICD-10-CM | POA: Diagnosis not present

## 2023-06-04 DIAGNOSIS — K602 Anal fissure, unspecified: Secondary | ICD-10-CM | POA: Diagnosis not present

## 2023-06-04 DIAGNOSIS — I509 Heart failure, unspecified: Secondary | ICD-10-CM | POA: Diagnosis not present

## 2023-06-04 DIAGNOSIS — C9000 Multiple myeloma not having achieved remission: Secondary | ICD-10-CM | POA: Diagnosis not present

## 2023-06-04 DIAGNOSIS — G311 Senile degeneration of brain, not elsewhere classified: Secondary | ICD-10-CM | POA: Diagnosis not present

## 2023-06-04 DIAGNOSIS — J3089 Other allergic rhinitis: Secondary | ICD-10-CM | POA: Diagnosis not present

## 2023-06-04 DIAGNOSIS — J45909 Unspecified asthma, uncomplicated: Secondary | ICD-10-CM | POA: Diagnosis not present

## 2023-06-06 DIAGNOSIS — J3089 Other allergic rhinitis: Secondary | ICD-10-CM | POA: Diagnosis not present

## 2023-06-06 DIAGNOSIS — C9000 Multiple myeloma not having achieved remission: Secondary | ICD-10-CM | POA: Diagnosis not present

## 2023-06-06 DIAGNOSIS — J45909 Unspecified asthma, uncomplicated: Secondary | ICD-10-CM | POA: Diagnosis not present

## 2023-06-06 DIAGNOSIS — G311 Senile degeneration of brain, not elsewhere classified: Secondary | ICD-10-CM | POA: Diagnosis not present

## 2023-06-06 DIAGNOSIS — K602 Anal fissure, unspecified: Secondary | ICD-10-CM | POA: Diagnosis not present

## 2023-06-06 DIAGNOSIS — I509 Heart failure, unspecified: Secondary | ICD-10-CM | POA: Diagnosis not present

## 2023-06-07 DIAGNOSIS — D860 Sarcoidosis of lung: Secondary | ICD-10-CM | POA: Diagnosis not present

## 2023-06-07 DIAGNOSIS — K602 Anal fissure, unspecified: Secondary | ICD-10-CM | POA: Diagnosis not present

## 2023-06-07 DIAGNOSIS — K219 Gastro-esophageal reflux disease without esophagitis: Secondary | ICD-10-CM | POA: Diagnosis not present

## 2023-06-07 DIAGNOSIS — J3089 Other allergic rhinitis: Secondary | ICD-10-CM | POA: Diagnosis not present

## 2023-06-07 DIAGNOSIS — C9000 Multiple myeloma not having achieved remission: Secondary | ICD-10-CM | POA: Diagnosis not present

## 2023-06-07 DIAGNOSIS — H409 Unspecified glaucoma: Secondary | ICD-10-CM | POA: Diagnosis not present

## 2023-06-07 DIAGNOSIS — I509 Heart failure, unspecified: Secondary | ICD-10-CM | POA: Diagnosis not present

## 2023-06-07 DIAGNOSIS — H3530 Unspecified macular degeneration: Secondary | ICD-10-CM | POA: Diagnosis not present

## 2023-06-07 DIAGNOSIS — E039 Hypothyroidism, unspecified: Secondary | ICD-10-CM | POA: Diagnosis not present

## 2023-06-07 DIAGNOSIS — G311 Senile degeneration of brain, not elsewhere classified: Secondary | ICD-10-CM | POA: Diagnosis not present

## 2023-06-07 DIAGNOSIS — I10 Essential (primary) hypertension: Secondary | ICD-10-CM | POA: Diagnosis not present

## 2023-06-07 DIAGNOSIS — J45909 Unspecified asthma, uncomplicated: Secondary | ICD-10-CM | POA: Diagnosis not present

## 2023-06-07 DIAGNOSIS — I519 Heart disease, unspecified: Secondary | ICD-10-CM | POA: Diagnosis not present

## 2023-06-23 DIAGNOSIS — H401113 Primary open-angle glaucoma, right eye, severe stage: Secondary | ICD-10-CM | POA: Diagnosis not present

## 2023-06-23 DIAGNOSIS — H401121 Primary open-angle glaucoma, left eye, mild stage: Secondary | ICD-10-CM | POA: Diagnosis not present

## 2023-06-23 DIAGNOSIS — Z961 Presence of intraocular lens: Secondary | ICD-10-CM | POA: Diagnosis not present

## 2023-06-23 DIAGNOSIS — H43813 Vitreous degeneration, bilateral: Secondary | ICD-10-CM | POA: Diagnosis not present

## 2023-06-23 DIAGNOSIS — H04123 Dry eye syndrome of bilateral lacrimal glands: Secondary | ICD-10-CM | POA: Diagnosis not present

## 2023-06-23 DIAGNOSIS — H353132 Nonexudative age-related macular degeneration, bilateral, intermediate dry stage: Secondary | ICD-10-CM | POA: Diagnosis not present

## 2023-06-24 ENCOUNTER — Encounter (HOSPITAL_COMMUNITY): Payer: Self-pay

## 2023-06-24 ENCOUNTER — Emergency Department (HOSPITAL_COMMUNITY)
Admission: EM | Admit: 2023-06-24 | Discharge: 2023-06-25 | Disposition: A | Discharge status: Home / Self Care | Payer: Medicare Other | Attending: Emergency Medicine | Admitting: Emergency Medicine

## 2023-06-24 ENCOUNTER — Emergency Department (HOSPITAL_COMMUNITY): Payer: Medicare Other

## 2023-06-24 DIAGNOSIS — R001 Bradycardia, unspecified: Secondary | ICD-10-CM | POA: Diagnosis not present

## 2023-06-24 DIAGNOSIS — D630 Anemia in neoplastic disease: Secondary | ICD-10-CM | POA: Diagnosis not present

## 2023-06-24 DIAGNOSIS — R55 Syncope and collapse: Secondary | ICD-10-CM | POA: Diagnosis not present

## 2023-06-24 DIAGNOSIS — C9000 Multiple myeloma not having achieved remission: Secondary | ICD-10-CM | POA: Diagnosis not present

## 2023-06-24 DIAGNOSIS — I110 Hypertensive heart disease with heart failure: Secondary | ICD-10-CM | POA: Insufficient documentation

## 2023-06-24 DIAGNOSIS — Z79899 Other long term (current) drug therapy: Secondary | ICD-10-CM | POA: Insufficient documentation

## 2023-06-24 DIAGNOSIS — I2510 Atherosclerotic heart disease of native coronary artery without angina pectoris: Secondary | ICD-10-CM | POA: Insufficient documentation

## 2023-06-24 DIAGNOSIS — D649 Anemia, unspecified: Secondary | ICD-10-CM | POA: Diagnosis not present

## 2023-06-24 DIAGNOSIS — I959 Hypotension, unspecified: Secondary | ICD-10-CM | POA: Diagnosis not present

## 2023-06-24 DIAGNOSIS — R531 Weakness: Secondary | ICD-10-CM | POA: Diagnosis not present

## 2023-06-24 DIAGNOSIS — I509 Heart failure, unspecified: Secondary | ICD-10-CM | POA: Insufficient documentation

## 2023-06-24 LAB — CBG MONITORING, ED: Glucose-Capillary: 94 mg/dL (ref 70–99)

## 2023-06-24 LAB — CBC WITH DIFFERENTIAL/PLATELET
Abs Immature Granulocytes: 0 10*3/uL (ref 0.00–0.07)
Band Neutrophils: 3 %
Basophils Absolute: 0 10*3/uL (ref 0.0–0.1)
Basophils Relative: 0 %
Eosinophils Absolute: 0 10*3/uL (ref 0.0–0.5)
Eosinophils Relative: 0 %
HCT: 23 % — ABNORMAL LOW (ref 39.0–52.0)
Hemoglobin: 7.6 g/dL — ABNORMAL LOW (ref 13.0–17.0)
Lymphocytes Relative: 24 %
Lymphs Abs: 1.3 10*3/uL (ref 0.7–4.0)
MCH: 39.2 pg — ABNORMAL HIGH (ref 26.0–34.0)
MCHC: 33 g/dL (ref 30.0–36.0)
MCV: 118.6 fL — ABNORMAL HIGH (ref 80.0–100.0)
Monocytes Absolute: 0.5 10*3/uL (ref 0.1–1.0)
Monocytes Relative: 10 %
Neutro Abs: 3.5 10*3/uL (ref 1.7–7.7)
Neutrophils Relative %: 63 %
Platelets: 102 10*3/uL — ABNORMAL LOW (ref 150–400)
RBC: 1.94 MIL/uL — ABNORMAL LOW (ref 4.22–5.81)
RDW: 14.8 % (ref 11.5–15.5)
Smear Review: DECREASED
WBC: 5.3 10*3/uL (ref 4.0–10.5)
nRBC: 0 % (ref 0.0–0.2)

## 2023-06-24 LAB — BASIC METABOLIC PANEL
Anion gap: 8 (ref 5–15)
BUN: 18 mg/dL (ref 8–23)
CO2: 25 mmol/L (ref 22–32)
Calcium: 9.2 mg/dL (ref 8.9–10.3)
Chloride: 102 mmol/L (ref 98–111)
Creatinine, Ser: 1.01 mg/dL (ref 0.61–1.24)
GFR, Estimated: >60 mL/min (ref >60)
Glucose, Bld: 103 mg/dL — ABNORMAL HIGH (ref 70–99)
Potassium: 3.6 mmol/L (ref 3.5–5.1)
Sodium: 135 mmol/L (ref 135–145)

## 2023-06-24 LAB — PREPARE RBC (CROSSMATCH)

## 2023-06-24 LAB — TROPONIN I (HIGH SENSITIVITY)
Troponin I (High Sensitivity): 4 ng/L (ref <18)
Troponin I (High Sensitivity): 4 ng/L (ref <18)

## 2023-06-24 MED ORDER — ISOSORBIDE MONONITRATE ER 60 MG PO TB24
60.0000 mg | ORAL_TABLET | Freq: Once | ORAL | Status: AC
  Administered 2023-06-25: 60 mg via ORAL
  Filled 2023-06-24: qty 1

## 2023-06-24 MED ORDER — METOPROLOL TARTRATE 25 MG PO TABS
25.0000 mg | ORAL_TABLET | Freq: Two times a day (BID) | ORAL | Status: DC
  Administered 2023-06-25 (×2): 25 mg via ORAL
  Filled 2023-06-24 (×2): qty 1

## 2023-06-24 MED ORDER — SODIUM CHLORIDE 0.9% IV SOLUTION: Freq: Once | INTRAVENOUS | Status: AC

## 2023-06-24 NOTE — ED Triage Notes (Signed)
Pt is a hospice pt at [facility] and the hospice nurse reports pt with low BP and pulse and not responding.

## 2023-06-24 NOTE — ED Provider Notes (Signed)
[place] EMERGENCY DEPARTMENT AT [facility] Provider Note   CSN: [id number] Arrival date & time: 06/24/23  1304     History  Chief Complaint  Patient presents with   Near Syncope    Dale [name] is a 89 y.o. male with a history including CAD, sarcoidosis, GERD, PUD, multiple myeloma, CHF and hypertension, hospice patient presenting from a local nursing home with 2 episodes of decreased level of consciousness/syncope.  His hospice nurse was visiting him at his nursing care center and witnessed 2 episodes of loss of consciousness while patient was supine and systolic blood pressure in the 60s and heart rate down to 20 to 30s and had cool fingertips.  1 episode lasted for approximately 20 minutes, the second for on known amount of time.  He has returned to his baseline and has no complaints at this time, he is here for further evaluation of this episode.  He denies chest pain, shortness of breath, abdominal pain, headache, fevers.  He does recall feeling very cold earlier today but this symptom has resolved.  The history is provided by the patient and the nursing home.       Home Medications Prior to Admission medications   Medication Sig Start Date End Date Taking? Authorizing Provider  acetaminophen (TYLENOL) 325 MG tablet Take 2 tablets (650 mg total) by mouth every 6 (six) hours as needed for mild pain (or Fever >/= 101). 01/17/22  Yes Vassie Loll, MD  brimonidine (ALPHAGAN) 0.2 % ophthalmic solution Place 1 drop into the left eye 2 (two) times daily.   Yes [provider]  carboxymethylcellulose (REFRESH PLUS) 0.5 % SOLN Place 1 drop into both eyes daily. 06/25/23  Yes [provider]  dexamethasone (DECADRON) 2 MG tablet Take 5 tablets (10 mg total) by mouth once a week. 02/11/23  Yes Doreatha Massed, MD  dorzolamide-timolol (COSOPT) 2-0.5 % ophthalmic solution Place 1 drop into the left eye 2 (two) times daily. 05/27/23  Yes [provider]  furosemide (LASIX) 20 MG tablet Take 20 mg by mouth daily. 04/08/23  Yes [provider]  isosorbide mononitrate (IMDUR) 60 MG 24 hr tablet Take 1 tablet (60 mg total) by mouth in the morning and at bedtime. 12/23/22  Yes Lewie Chamber, MD  levothyroxine (SYNTHROID) 75 MCG tablet Take 75 mcg by mouth daily before breakfast. 07/16/21  Yes [provider]  losartan (COZAAR) 50 MG tablet Take 1 tablet (50 mg total) by mouth daily. 12/23/22  Yes Lewie Chamber, MD  metoprolol tartrate (LOPRESSOR) 25 MG tablet Take 1 tablet (25 mg total) by mouth 2 (two) times daily. TAKE 1 TABLET BY MOUTH IN  THE MORNING AND ONE-HALF  TABLET BY MOUTH IN THE  EVENING 12/23/22  Yes Lewie Chamber, MD  nitroGLYCERIN (NITROSTAT) 0.4 MG SL tablet Place 1 tablet (0.4 mg total) under the tongue every 5 (five) minutes as needed. For chest pain 02/15/22  Yes Lennette Bihari, MD  ondansetron (ZOFRAN) 4 MG tablet Take 4 mg by mouth every 8 (eight) hours as needed for nausea or vomiting. 03/12/23  Yes [provider]  pantoprazole (PROTONIX) 40 MG tablet TAKE 1 TABLET BY MOUTH DAILY  BEFORE BREAKFAST Patient taking differently: Take 40 mg by mouth daily. 01/28/22  Yes Mahon, Frederik Schmidt, NP  prednisoLONE acetate (PRED FORTE) 1 % ophthalmic suspension Place 1 drop into the right eye in the morning and at bedtime. 06/25/23  Yes [provider]  ROCKLATAN 0.02-0.005 % SOLN  Place 1 drop into the left eye at bedtime. 12/21/19  Yes [provider]      Allergies    Azithromycin, Doxazosin, Atenolol, Hydrocodone, Levofloxacin, Morphine, Penicillins, Sulfonamide derivatives, and Soap    Review of Systems   Review of Systems  Constitutional:  Positive for chills. Negative for fever.  HENT:  Negative for congestion and sore throat.   Eyes: Negative.   Respiratory:  Negative for chest tightness and shortness of breath.   Cardiovascular:  Negative for chest pain.  Gastrointestinal:  Negative for  abdominal pain and nausea.  Genitourinary: Negative.   Musculoskeletal:  Negative for arthralgias, joint swelling and neck pain.  Skin: Negative.  Negative for rash and wound.  Neurological:  Positive for syncope. Negative for dizziness, weakness, light-headedness, numbness and headaches.  Psychiatric/Behavioral: Negative.      Physical Exam Updated Vital Signs BP (!) 150/66   Pulse (!) 56   Temp 97.7 F (36.5 C) (Oral)   Resp 17   Ht 5\' 8"  (1.727 m)   Wt 57.6 kg   SpO2 97%   BMI 19.31 kg/m  Physical Exam Vitals and nursing note reviewed.  Constitutional:      Appearance: Normal appearance. He is well-developed.     Comments: Cachectic,  appears weak,  appears stated age.  HENT:     Head: Normocephalic and atraumatic.     Mouth/Throat:     Mouth: Mucous membranes are dry.  Eyes:     Conjunctiva/sclera: Conjunctivae normal.  Cardiovascular:     Rate and Rhythm: Regular rhythm. Bradycardia present.     Pulses:          Radial pulses are 2+ on the right side and 2+ on the left side.     Heart sounds: Normal heart sounds.     Comments: Rate on monitor 55-60. Pulmonary:     Effort: Pulmonary effort is normal.     Breath sounds: Normal breath sounds. No wheezing.  Abdominal:     General: Bowel sounds are normal.     Palpations: Abdomen is soft.     Tenderness: There is no abdominal tenderness. There is no guarding.  Musculoskeletal:        General: Normal range of motion.     Cervical back: Normal range of motion.     Right lower leg: No edema.     Left lower leg: No edema.  Skin:    General: Skin is warm and dry.  Neurological:     Mental Status: He is alert.     ED Results / Procedures / Treatments   Labs (all labs ordered are listed, but only abnormal results are displayed) Labs Reviewed  BASIC METABOLIC PANEL - Abnormal; Notable for the following components:      Result Value   Glucose, Bld 103 (*)    All other components within normal limits  CBC WITH  DIFFERENTIAL/PLATELET - Abnormal; Notable for the following components:   RBC 1.94 (*)    Hemoglobin 7.6 (*)    HCT 23.0 (*)    MCV 118.6 (*)    MCH 39.2 (*)    Platelets 102 (*)    All other components within normal limits  URINALYSIS, ROUTINE W REFLEX MICROSCOPIC  CBG MONITORING, ED  PREPARE RBC (CROSSMATCH)  TYPE AND SCREEN  TROPONIN I (HIGH SENSITIVITY)  TROPONIN I (HIGH SENSITIVITY)    EKG None  Radiology DG Chest Portable 1 View Result Date: 06/24/2023 CLINICAL DATA:  Syncope, hypotension and bradycardia. EXAM:  PORTABLE CHEST 1 VIEW COMPARISON:  12/15/2022 FINDINGS: Normal sized heart. Stable mild bilateral interstitial fibrosis and scarring. Diffuse osteopenia. Mild lower thoracic spine degenerative changes. IMPRESSION: No acute abnormality. Electronically Signed   By: Beckie Salts M.D.   On: 06/24/2023 16:08    Procedures Procedures    Medications Ordered in ED Medications  0.9 %  sodium chloride infusion (Manually program via Guardrails IV Fluids) (0 mLs Intravenous Hold 06/24/23 1558)  isosorbide mononitrate (IMDUR) 24 hr tablet 60 mg (has no administration in time range)  metoprolol tartrate (LOPRESSOR) tablet 25 mg (has no administration in time range)    ED Course/ Medical Decision Making/ A&P                                 Medical Decision Making Pt presenting with syncopal event of unclear etiology  but is significantly anemic today.  Has multiple myeloma under hospice care.  Feels he is at his baseline upon arrival in the ed.  Discussed with him and family benefit of blood transfusion and all are agreeable to receive this.   Ordered, pending match with antibodies,  product found in [place],  should be here by 2 am.  Will plan dc back to his nursing home after tx.    Discussed with Dr. Bebe Shaggy.   Amount and/or Complexity of Data Reviewed Labs: ordered.    Details: Significant for hgb 7.6.  Negative delta troponins be met unremarkable. Radiology:  ordered.    Details: Reviewed, no acute cardiopulmonary findings.   Risk Prescription drug management.           Final Clinical Impression(s) / ED Diagnoses Final diagnoses:  Syncope and collapse  Anemia in neoplastic disease    Rx / DC Orders ED Discharge Orders     None         Victoriano Lain 06/25/23 0003    Zadie Rhine, MD 06/25/23 [phone]

## 2023-06-25 NOTE — ED Provider Notes (Signed)
  Provider Note MRN:  [id number]  Arrival date & time: 06/25/23    ED Course and Medical Decision Making  Assumed care from Dr Bebe Shaggy at shift change.  See note from prior team for complete details, in brief:  89 year old male, he is on hospice Acute on chronic anemia Blood transfusion  Plan per prior physician blood transfusion, dispo  Patient received his blood transfusion, reports he is feeling better.  He is requesting something to eat, will provide.  Plan dc back to facility.  The patient improved significantly and was discharged in stable condition. Detailed discussions were had with the patient/guardian regarding current findings, and need for close f/u with PCP or on call doctor. The patient/guardian has been instructed to return immediately if the symptoms worsen in any way for re-evaluation. Patient/guardian verbalized understanding and is in agreement with current care plan. All questions answered prior to discharge.    Procedures  Final Clinical Impressions(s) / ED Diagnoses     ICD-10-CM   1. Syncope and collapse  R55     2. Anemia in neoplastic disease  D63.0       ED Discharge Orders     None         Discharge Instructions      Have someone stay with you until you feel stable. Do not drive, operate machinery, or play sports until your caregiver says it is okay. Keep all follow-up appointments as directed by your caregiver. Lie down right away if you start feeling like you might faint. Breathe deeply and steadily. Wait until all the symptoms have passed.Drink enough fluids to keep your urine clear or pale yellow. If you are taking blood pressure or heart medicine, get up slowly, taking several minutes to sit and then stand. This can reduce dizziness. SEEK IMMEDIATE MEDICAL CARE IF: You have a severe headache. You have unusual pain in the chest, abdomen, or back. You are bleeding from the mouth or rectum, or you have a black or tarry stool. You have an irregular  or very fast heartbeat. You have pain with breathing. You have repeated fainting or seizure-like jerking during an episode. You faint when sitting or lying down. You have confusion. You have difficulty walking. You have severe weakness. You have vision problems. If you fainted, call your local emergency services - do not drive yourself to the hospital.   Please return to the emergency department immediately for any new or concerning symptoms, or if you get worse.         Sloan Leiter, DO 06/25/23 [phone]

## 2023-06-25 NOTE — Discharge Instructions (Signed)
Have someone stay with you until you feel stable. Do not drive, operate machinery, or play sports until your caregiver says it is okay. Keep all follow-up appointments as directed by your caregiver. Lie down right away if you start feeling like you might faint. Breathe deeply and steadily. Wait until all the symptoms have passed.Drink enough fluids to keep your urine clear or pale yellow. If you are taking blood pressure or heart medicine, get up slowly, taking several minutes to sit and then stand. This can reduce dizziness. SEEK IMMEDIATE MEDICAL CARE IF: You have a severe headache. You have unusual pain in the chest, abdomen, or back. You are bleeding from the mouth or rectum, or you have a black or tarry stool. You have an irregular or very fast heartbeat. You have pain with breathing. You have repeated fainting or seizure-like jerking during an episode. You faint when sitting or lying down. You have confusion. You have difficulty walking. You have severe weakness. You have vision problems. If you fainted, call your local emergency services - do not drive yourself to the hospital.   Please return to the emergency department immediately for any new or concerning symptoms, or if you get worse. 

## 2023-06-26 ENCOUNTER — Encounter: Payer: Self-pay | Admitting: Hematology

## 2023-06-26 LAB — BPAM RBC
Blood Product Expiration Date: 202503182359
ISSUE DATE / TIME: 202502190503
Unit Type and Rh: 5100

## 2023-06-26 LAB — TYPE AND SCREEN
ABO/RH(D): O POS
Antibody Screen: NEGATIVE
Unit division: 0

## 2023-06-30 DIAGNOSIS — I10 Essential (primary) hypertension: Secondary | ICD-10-CM | POA: Diagnosis not present

## 2023-06-30 DIAGNOSIS — D649 Anemia, unspecified: Secondary | ICD-10-CM | POA: Diagnosis not present

## 2023-06-30 DIAGNOSIS — R55 Syncope and collapse: Secondary | ICD-10-CM | POA: Diagnosis not present

## 2023-06-30 DIAGNOSIS — I110 Hypertensive heart disease with heart failure: Secondary | ICD-10-CM | POA: Diagnosis not present

## 2023-07-05 DIAGNOSIS — J3089 Other allergic rhinitis: Secondary | ICD-10-CM | POA: Diagnosis not present

## 2023-07-05 DIAGNOSIS — I519 Heart disease, unspecified: Secondary | ICD-10-CM | POA: Diagnosis not present

## 2023-07-05 DIAGNOSIS — H409 Unspecified glaucoma: Secondary | ICD-10-CM | POA: Diagnosis not present

## 2023-07-05 DIAGNOSIS — H3530 Unspecified macular degeneration: Secondary | ICD-10-CM | POA: Diagnosis not present

## 2023-07-05 DIAGNOSIS — K219 Gastro-esophageal reflux disease without esophagitis: Secondary | ICD-10-CM | POA: Diagnosis not present

## 2023-07-05 DIAGNOSIS — G311 Senile degeneration of brain, not elsewhere classified: Secondary | ICD-10-CM | POA: Diagnosis not present

## 2023-07-05 DIAGNOSIS — E039 Hypothyroidism, unspecified: Secondary | ICD-10-CM | POA: Diagnosis not present

## 2023-07-05 DIAGNOSIS — I10 Essential (primary) hypertension: Secondary | ICD-10-CM | POA: Diagnosis not present

## 2023-07-05 DIAGNOSIS — C9000 Multiple myeloma not having achieved remission: Secondary | ICD-10-CM | POA: Diagnosis not present

## 2023-07-05 DIAGNOSIS — J45909 Unspecified asthma, uncomplicated: Secondary | ICD-10-CM | POA: Diagnosis not present

## 2023-07-05 DIAGNOSIS — I509 Heart failure, unspecified: Secondary | ICD-10-CM | POA: Diagnosis not present

## 2023-07-05 DIAGNOSIS — K602 Anal fissure, unspecified: Secondary | ICD-10-CM | POA: Diagnosis not present

## 2023-07-05 DIAGNOSIS — D860 Sarcoidosis of lung: Secondary | ICD-10-CM | POA: Diagnosis not present

## 2023-07-07 DIAGNOSIS — I509 Heart failure, unspecified: Secondary | ICD-10-CM | POA: Diagnosis not present

## 2023-07-07 DIAGNOSIS — J3089 Other allergic rhinitis: Secondary | ICD-10-CM | POA: Diagnosis not present

## 2023-07-07 DIAGNOSIS — G311 Senile degeneration of brain, not elsewhere classified: Secondary | ICD-10-CM | POA: Diagnosis not present

## 2023-07-07 DIAGNOSIS — C9000 Multiple myeloma not having achieved remission: Secondary | ICD-10-CM | POA: Diagnosis not present

## 2023-07-07 DIAGNOSIS — K602 Anal fissure, unspecified: Secondary | ICD-10-CM | POA: Diagnosis not present

## 2023-07-07 DIAGNOSIS — J45909 Unspecified asthma, uncomplicated: Secondary | ICD-10-CM | POA: Diagnosis not present

## 2023-07-08 DIAGNOSIS — C9000 Multiple myeloma not having achieved remission: Secondary | ICD-10-CM | POA: Diagnosis not present

## 2023-07-08 DIAGNOSIS — J45909 Unspecified asthma, uncomplicated: Secondary | ICD-10-CM | POA: Diagnosis not present

## 2023-07-08 DIAGNOSIS — I509 Heart failure, unspecified: Secondary | ICD-10-CM | POA: Diagnosis not present

## 2023-07-08 DIAGNOSIS — K602 Anal fissure, unspecified: Secondary | ICD-10-CM | POA: Diagnosis not present

## 2023-07-08 DIAGNOSIS — J3089 Other allergic rhinitis: Secondary | ICD-10-CM | POA: Diagnosis not present

## 2023-07-08 DIAGNOSIS — G311 Senile degeneration of brain, not elsewhere classified: Secondary | ICD-10-CM | POA: Diagnosis not present

## 2023-07-09 DIAGNOSIS — J45909 Unspecified asthma, uncomplicated: Secondary | ICD-10-CM | POA: Diagnosis not present

## 2023-07-09 DIAGNOSIS — J3089 Other allergic rhinitis: Secondary | ICD-10-CM | POA: Diagnosis not present

## 2023-07-09 DIAGNOSIS — C9000 Multiple myeloma not having achieved remission: Secondary | ICD-10-CM | POA: Diagnosis not present

## 2023-07-09 DIAGNOSIS — K602 Anal fissure, unspecified: Secondary | ICD-10-CM | POA: Diagnosis not present

## 2023-07-09 DIAGNOSIS — G311 Senile degeneration of brain, not elsewhere classified: Secondary | ICD-10-CM | POA: Diagnosis not present

## 2023-07-09 DIAGNOSIS — I509 Heart failure, unspecified: Secondary | ICD-10-CM | POA: Diagnosis not present

## 2023-07-10 DIAGNOSIS — I509 Heart failure, unspecified: Secondary | ICD-10-CM | POA: Diagnosis not present

## 2023-07-10 DIAGNOSIS — K602 Anal fissure, unspecified: Secondary | ICD-10-CM | POA: Diagnosis not present

## 2023-07-10 DIAGNOSIS — J3089 Other allergic rhinitis: Secondary | ICD-10-CM | POA: Diagnosis not present

## 2023-07-10 DIAGNOSIS — G311 Senile degeneration of brain, not elsewhere classified: Secondary | ICD-10-CM | POA: Diagnosis not present

## 2023-07-10 DIAGNOSIS — C9000 Multiple myeloma not having achieved remission: Secondary | ICD-10-CM | POA: Diagnosis not present

## 2023-07-10 DIAGNOSIS — J45909 Unspecified asthma, uncomplicated: Secondary | ICD-10-CM | POA: Diagnosis not present

## 2023-07-11 DIAGNOSIS — I10 Essential (primary) hypertension: Secondary | ICD-10-CM | POA: Diagnosis not present

## 2023-07-11 DIAGNOSIS — E119 Type 2 diabetes mellitus without complications: Secondary | ICD-10-CM | POA: Diagnosis not present

## 2023-07-11 DIAGNOSIS — E559 Vitamin D deficiency, unspecified: Secondary | ICD-10-CM | POA: Diagnosis not present

## 2023-07-11 DIAGNOSIS — E039 Hypothyroidism, unspecified: Secondary | ICD-10-CM | POA: Diagnosis not present

## 2023-07-11 DIAGNOSIS — E782 Mixed hyperlipidemia: Secondary | ICD-10-CM | POA: Diagnosis not present

## 2023-07-14 DIAGNOSIS — J3089 Other allergic rhinitis: Secondary | ICD-10-CM | POA: Diagnosis not present

## 2023-07-14 DIAGNOSIS — J45909 Unspecified asthma, uncomplicated: Secondary | ICD-10-CM | POA: Diagnosis not present

## 2023-07-14 DIAGNOSIS — G311 Senile degeneration of brain, not elsewhere classified: Secondary | ICD-10-CM | POA: Diagnosis not present

## 2023-07-14 DIAGNOSIS — K602 Anal fissure, unspecified: Secondary | ICD-10-CM | POA: Diagnosis not present

## 2023-07-14 DIAGNOSIS — I509 Heart failure, unspecified: Secondary | ICD-10-CM | POA: Diagnosis not present

## 2023-07-14 DIAGNOSIS — C9000 Multiple myeloma not having achieved remission: Secondary | ICD-10-CM | POA: Diagnosis not present

## 2023-07-15 DIAGNOSIS — J3089 Other allergic rhinitis: Secondary | ICD-10-CM | POA: Diagnosis not present

## 2023-07-15 DIAGNOSIS — G311 Senile degeneration of brain, not elsewhere classified: Secondary | ICD-10-CM | POA: Diagnosis not present

## 2023-07-15 DIAGNOSIS — K602 Anal fissure, unspecified: Secondary | ICD-10-CM | POA: Diagnosis not present

## 2023-07-15 DIAGNOSIS — J45909 Unspecified asthma, uncomplicated: Secondary | ICD-10-CM | POA: Diagnosis not present

## 2023-07-15 DIAGNOSIS — C9000 Multiple myeloma not having achieved remission: Secondary | ICD-10-CM | POA: Diagnosis not present

## 2023-07-15 DIAGNOSIS — I509 Heart failure, unspecified: Secondary | ICD-10-CM | POA: Diagnosis not present

## 2023-07-16 DIAGNOSIS — J45909 Unspecified asthma, uncomplicated: Secondary | ICD-10-CM | POA: Diagnosis not present

## 2023-07-16 DIAGNOSIS — J3089 Other allergic rhinitis: Secondary | ICD-10-CM | POA: Diagnosis not present

## 2023-07-16 DIAGNOSIS — I509 Heart failure, unspecified: Secondary | ICD-10-CM | POA: Diagnosis not present

## 2023-07-16 DIAGNOSIS — C9000 Multiple myeloma not having achieved remission: Secondary | ICD-10-CM | POA: Diagnosis not present

## 2023-07-16 DIAGNOSIS — G311 Senile degeneration of brain, not elsewhere classified: Secondary | ICD-10-CM | POA: Diagnosis not present

## 2023-07-16 DIAGNOSIS — K602 Anal fissure, unspecified: Secondary | ICD-10-CM | POA: Diagnosis not present

## 2023-07-17 DIAGNOSIS — J45909 Unspecified asthma, uncomplicated: Secondary | ICD-10-CM | POA: Diagnosis not present

## 2023-07-17 DIAGNOSIS — G311 Senile degeneration of brain, not elsewhere classified: Secondary | ICD-10-CM | POA: Diagnosis not present

## 2023-07-17 DIAGNOSIS — J3089 Other allergic rhinitis: Secondary | ICD-10-CM | POA: Diagnosis not present

## 2023-07-17 DIAGNOSIS — K602 Anal fissure, unspecified: Secondary | ICD-10-CM | POA: Diagnosis not present

## 2023-07-17 DIAGNOSIS — C9000 Multiple myeloma not having achieved remission: Secondary | ICD-10-CM | POA: Diagnosis not present

## 2023-07-17 DIAGNOSIS — I509 Heart failure, unspecified: Secondary | ICD-10-CM | POA: Diagnosis not present

## 2023-07-21 DIAGNOSIS — C9000 Multiple myeloma not having achieved remission: Secondary | ICD-10-CM | POA: Diagnosis not present

## 2023-07-21 DIAGNOSIS — R4182 Altered mental status, unspecified: Secondary | ICD-10-CM | POA: Diagnosis not present

## 2023-07-21 DIAGNOSIS — J3089 Other allergic rhinitis: Secondary | ICD-10-CM | POA: Diagnosis not present

## 2023-07-21 DIAGNOSIS — K602 Anal fissure, unspecified: Secondary | ICD-10-CM | POA: Diagnosis not present

## 2023-07-21 DIAGNOSIS — E039 Hypothyroidism, unspecified: Secondary | ICD-10-CM | POA: Diagnosis not present

## 2023-07-21 DIAGNOSIS — E559 Vitamin D deficiency, unspecified: Secondary | ICD-10-CM | POA: Diagnosis not present

## 2023-07-21 DIAGNOSIS — J45909 Unspecified asthma, uncomplicated: Secondary | ICD-10-CM | POA: Diagnosis not present

## 2023-07-21 DIAGNOSIS — I509 Heart failure, unspecified: Secondary | ICD-10-CM | POA: Diagnosis not present

## 2023-07-21 DIAGNOSIS — G311 Senile degeneration of brain, not elsewhere classified: Secondary | ICD-10-CM | POA: Diagnosis not present

## 2023-07-22 DIAGNOSIS — K602 Anal fissure, unspecified: Secondary | ICD-10-CM | POA: Diagnosis not present

## 2023-07-22 DIAGNOSIS — I509 Heart failure, unspecified: Secondary | ICD-10-CM | POA: Diagnosis not present

## 2023-07-22 DIAGNOSIS — J3089 Other allergic rhinitis: Secondary | ICD-10-CM | POA: Diagnosis not present

## 2023-07-22 DIAGNOSIS — C9000 Multiple myeloma not having achieved remission: Secondary | ICD-10-CM | POA: Diagnosis not present

## 2023-07-22 DIAGNOSIS — G311 Senile degeneration of brain, not elsewhere classified: Secondary | ICD-10-CM | POA: Diagnosis not present

## 2023-07-22 DIAGNOSIS — J45909 Unspecified asthma, uncomplicated: Secondary | ICD-10-CM | POA: Diagnosis not present

## 2023-07-23 DIAGNOSIS — K602 Anal fissure, unspecified: Secondary | ICD-10-CM | POA: Diagnosis not present

## 2023-07-23 DIAGNOSIS — J45909 Unspecified asthma, uncomplicated: Secondary | ICD-10-CM | POA: Diagnosis not present

## 2023-07-23 DIAGNOSIS — G311 Senile degeneration of brain, not elsewhere classified: Secondary | ICD-10-CM | POA: Diagnosis not present

## 2023-07-23 DIAGNOSIS — J3089 Other allergic rhinitis: Secondary | ICD-10-CM | POA: Diagnosis not present

## 2023-07-23 DIAGNOSIS — C9000 Multiple myeloma not having achieved remission: Secondary | ICD-10-CM | POA: Diagnosis not present

## 2023-07-23 DIAGNOSIS — I509 Heart failure, unspecified: Secondary | ICD-10-CM | POA: Diagnosis not present

## 2023-07-24 DIAGNOSIS — N390 Urinary tract infection, site not specified: Secondary | ICD-10-CM | POA: Diagnosis not present

## 2023-07-25 DIAGNOSIS — J45909 Unspecified asthma, uncomplicated: Secondary | ICD-10-CM | POA: Diagnosis not present

## 2023-07-25 DIAGNOSIS — I509 Heart failure, unspecified: Secondary | ICD-10-CM | POA: Diagnosis not present

## 2023-07-25 DIAGNOSIS — C9000 Multiple myeloma not having achieved remission: Secondary | ICD-10-CM | POA: Diagnosis not present

## 2023-07-25 DIAGNOSIS — G311 Senile degeneration of brain, not elsewhere classified: Secondary | ICD-10-CM | POA: Diagnosis not present

## 2023-07-25 DIAGNOSIS — J3089 Other allergic rhinitis: Secondary | ICD-10-CM | POA: Diagnosis not present

## 2023-07-25 DIAGNOSIS — K602 Anal fissure, unspecified: Secondary | ICD-10-CM | POA: Diagnosis not present

## 2023-07-26 DIAGNOSIS — C9000 Multiple myeloma not having achieved remission: Secondary | ICD-10-CM | POA: Diagnosis not present

## 2023-07-26 DIAGNOSIS — J45909 Unspecified asthma, uncomplicated: Secondary | ICD-10-CM | POA: Diagnosis not present

## 2023-07-26 DIAGNOSIS — G311 Senile degeneration of brain, not elsewhere classified: Secondary | ICD-10-CM | POA: Diagnosis not present

## 2023-07-26 DIAGNOSIS — J3089 Other allergic rhinitis: Secondary | ICD-10-CM | POA: Diagnosis not present

## 2023-07-26 DIAGNOSIS — I509 Heart failure, unspecified: Secondary | ICD-10-CM | POA: Diagnosis not present

## 2023-07-26 DIAGNOSIS — K602 Anal fissure, unspecified: Secondary | ICD-10-CM | POA: Diagnosis not present

## 2023-07-27 DIAGNOSIS — K602 Anal fissure, unspecified: Secondary | ICD-10-CM | POA: Diagnosis not present

## 2023-07-27 DIAGNOSIS — J45909 Unspecified asthma, uncomplicated: Secondary | ICD-10-CM | POA: Diagnosis not present

## 2023-07-27 DIAGNOSIS — G311 Senile degeneration of brain, not elsewhere classified: Secondary | ICD-10-CM | POA: Diagnosis not present

## 2023-07-27 DIAGNOSIS — C9000 Multiple myeloma not having achieved remission: Secondary | ICD-10-CM | POA: Diagnosis not present

## 2023-07-27 DIAGNOSIS — I509 Heart failure, unspecified: Secondary | ICD-10-CM | POA: Diagnosis not present

## 2023-07-27 DIAGNOSIS — J3089 Other allergic rhinitis: Secondary | ICD-10-CM | POA: Diagnosis not present

## 2023-07-28 DIAGNOSIS — C9000 Multiple myeloma not having achieved remission: Secondary | ICD-10-CM | POA: Diagnosis not present

## 2023-07-28 DIAGNOSIS — M79675 Pain in left toe(s): Secondary | ICD-10-CM | POA: Diagnosis not present

## 2023-07-28 DIAGNOSIS — J45909 Unspecified asthma, uncomplicated: Secondary | ICD-10-CM | POA: Diagnosis not present

## 2023-07-28 DIAGNOSIS — N390 Urinary tract infection, site not specified: Secondary | ICD-10-CM | POA: Diagnosis not present

## 2023-07-28 DIAGNOSIS — G311 Senile degeneration of brain, not elsewhere classified: Secondary | ICD-10-CM | POA: Diagnosis not present

## 2023-07-28 DIAGNOSIS — J3089 Other allergic rhinitis: Secondary | ICD-10-CM | POA: Diagnosis not present

## 2023-07-28 DIAGNOSIS — I509 Heart failure, unspecified: Secondary | ICD-10-CM | POA: Diagnosis not present

## 2023-07-28 DIAGNOSIS — L600 Ingrowing nail: Secondary | ICD-10-CM | POA: Diagnosis not present

## 2023-07-28 DIAGNOSIS — K602 Anal fissure, unspecified: Secondary | ICD-10-CM | POA: Diagnosis not present

## 2023-07-29 DIAGNOSIS — G311 Senile degeneration of brain, not elsewhere classified: Secondary | ICD-10-CM | POA: Diagnosis not present

## 2023-07-29 DIAGNOSIS — J3089 Other allergic rhinitis: Secondary | ICD-10-CM | POA: Diagnosis not present

## 2023-07-29 DIAGNOSIS — C9000 Multiple myeloma not having achieved remission: Secondary | ICD-10-CM | POA: Diagnosis not present

## 2023-07-29 DIAGNOSIS — K602 Anal fissure, unspecified: Secondary | ICD-10-CM | POA: Diagnosis not present

## 2023-07-29 DIAGNOSIS — I509 Heart failure, unspecified: Secondary | ICD-10-CM | POA: Diagnosis not present

## 2023-07-29 DIAGNOSIS — J45909 Unspecified asthma, uncomplicated: Secondary | ICD-10-CM | POA: Diagnosis not present

## 2023-07-30 DIAGNOSIS — I509 Heart failure, unspecified: Secondary | ICD-10-CM | POA: Diagnosis not present

## 2023-07-30 DIAGNOSIS — J3089 Other allergic rhinitis: Secondary | ICD-10-CM | POA: Diagnosis not present

## 2023-07-30 DIAGNOSIS — K602 Anal fissure, unspecified: Secondary | ICD-10-CM | POA: Diagnosis not present

## 2023-07-30 DIAGNOSIS — J45909 Unspecified asthma, uncomplicated: Secondary | ICD-10-CM | POA: Diagnosis not present

## 2023-07-30 DIAGNOSIS — C9000 Multiple myeloma not having achieved remission: Secondary | ICD-10-CM | POA: Diagnosis not present

## 2023-07-30 DIAGNOSIS — G311 Senile degeneration of brain, not elsewhere classified: Secondary | ICD-10-CM | POA: Diagnosis not present

## 2023-08-01 DIAGNOSIS — G311 Senile degeneration of brain, not elsewhere classified: Secondary | ICD-10-CM | POA: Diagnosis not present

## 2023-08-01 DIAGNOSIS — C9000 Multiple myeloma not having achieved remission: Secondary | ICD-10-CM | POA: Diagnosis not present

## 2023-08-01 DIAGNOSIS — I509 Heart failure, unspecified: Secondary | ICD-10-CM | POA: Diagnosis not present

## 2023-08-01 DIAGNOSIS — K602 Anal fissure, unspecified: Secondary | ICD-10-CM | POA: Diagnosis not present

## 2023-08-01 DIAGNOSIS — J45909 Unspecified asthma, uncomplicated: Secondary | ICD-10-CM | POA: Diagnosis not present

## 2023-08-01 DIAGNOSIS — J3089 Other allergic rhinitis: Secondary | ICD-10-CM | POA: Diagnosis not present

## 2023-08-02 DIAGNOSIS — J45909 Unspecified asthma, uncomplicated: Secondary | ICD-10-CM | POA: Diagnosis not present

## 2023-08-02 DIAGNOSIS — J3089 Other allergic rhinitis: Secondary | ICD-10-CM | POA: Diagnosis not present

## 2023-08-02 DIAGNOSIS — G311 Senile degeneration of brain, not elsewhere classified: Secondary | ICD-10-CM | POA: Diagnosis not present

## 2023-08-02 DIAGNOSIS — I509 Heart failure, unspecified: Secondary | ICD-10-CM | POA: Diagnosis not present

## 2023-08-02 DIAGNOSIS — K602 Anal fissure, unspecified: Secondary | ICD-10-CM | POA: Diagnosis not present

## 2023-08-02 DIAGNOSIS — C9000 Multiple myeloma not having achieved remission: Secondary | ICD-10-CM | POA: Diagnosis not present

## 2023-08-03 DIAGNOSIS — J45909 Unspecified asthma, uncomplicated: Secondary | ICD-10-CM | POA: Diagnosis not present

## 2023-08-03 DIAGNOSIS — K602 Anal fissure, unspecified: Secondary | ICD-10-CM | POA: Diagnosis not present

## 2023-08-03 DIAGNOSIS — C9000 Multiple myeloma not having achieved remission: Secondary | ICD-10-CM | POA: Diagnosis not present

## 2023-08-03 DIAGNOSIS — G311 Senile degeneration of brain, not elsewhere classified: Secondary | ICD-10-CM | POA: Diagnosis not present

## 2023-08-03 DIAGNOSIS — I509 Heart failure, unspecified: Secondary | ICD-10-CM | POA: Diagnosis not present

## 2023-08-03 DIAGNOSIS — J3089 Other allergic rhinitis: Secondary | ICD-10-CM | POA: Diagnosis not present

## 2023-08-04 DIAGNOSIS — I209 Angina pectoris, unspecified: Secondary | ICD-10-CM | POA: Diagnosis not present

## 2023-08-04 DIAGNOSIS — I110 Hypertensive heart disease with heart failure: Secondary | ICD-10-CM | POA: Diagnosis not present

## 2023-08-04 DIAGNOSIS — D649 Anemia, unspecified: Secondary | ICD-10-CM | POA: Diagnosis not present

## 2023-08-04 DIAGNOSIS — C9000 Multiple myeloma not having achieved remission: Secondary | ICD-10-CM | POA: Diagnosis not present

## 2023-08-04 DIAGNOSIS — E559 Vitamin D deficiency, unspecified: Secondary | ICD-10-CM | POA: Diagnosis not present

## 2023-08-04 DIAGNOSIS — D519 Vitamin B12 deficiency anemia, unspecified: Secondary | ICD-10-CM | POA: Diagnosis not present

## 2023-08-04 DIAGNOSIS — F0390 Unspecified dementia without behavioral disturbance: Secondary | ICD-10-CM | POA: Diagnosis not present

## 2023-08-04 DIAGNOSIS — G4700 Insomnia, unspecified: Secondary | ICD-10-CM | POA: Diagnosis not present

## 2023-08-04 DIAGNOSIS — I509 Heart failure, unspecified: Secondary | ICD-10-CM | POA: Diagnosis not present

## 2023-08-04 DIAGNOSIS — J45909 Unspecified asthma, uncomplicated: Secondary | ICD-10-CM | POA: Diagnosis not present

## 2023-08-04 DIAGNOSIS — G311 Senile degeneration of brain, not elsewhere classified: Secondary | ICD-10-CM | POA: Diagnosis not present

## 2023-08-04 DIAGNOSIS — K219 Gastro-esophageal reflux disease without esophagitis: Secondary | ICD-10-CM | POA: Diagnosis not present

## 2023-08-04 DIAGNOSIS — E039 Hypothyroidism, unspecified: Secondary | ICD-10-CM | POA: Diagnosis not present

## 2023-08-04 DIAGNOSIS — K602 Anal fissure, unspecified: Secondary | ICD-10-CM | POA: Diagnosis not present

## 2023-08-04 DIAGNOSIS — J3089 Other allergic rhinitis: Secondary | ICD-10-CM | POA: Diagnosis not present

## 2023-08-05 DIAGNOSIS — E039 Hypothyroidism, unspecified: Secondary | ICD-10-CM | POA: Diagnosis not present

## 2023-08-05 DIAGNOSIS — K219 Gastro-esophageal reflux disease without esophagitis: Secondary | ICD-10-CM | POA: Diagnosis not present

## 2023-08-05 DIAGNOSIS — K602 Anal fissure, unspecified: Secondary | ICD-10-CM | POA: Diagnosis not present

## 2023-08-05 DIAGNOSIS — J45909 Unspecified asthma, uncomplicated: Secondary | ICD-10-CM | POA: Diagnosis not present

## 2023-08-05 DIAGNOSIS — I509 Heart failure, unspecified: Secondary | ICD-10-CM | POA: Diagnosis not present

## 2023-08-05 DIAGNOSIS — F4321 Adjustment disorder with depressed mood: Secondary | ICD-10-CM | POA: Diagnosis not present

## 2023-08-05 DIAGNOSIS — D860 Sarcoidosis of lung: Secondary | ICD-10-CM | POA: Diagnosis not present

## 2023-08-05 DIAGNOSIS — C9000 Multiple myeloma not having achieved remission: Secondary | ICD-10-CM | POA: Diagnosis not present

## 2023-08-05 DIAGNOSIS — I10 Essential (primary) hypertension: Secondary | ICD-10-CM | POA: Diagnosis not present

## 2023-08-05 DIAGNOSIS — J3089 Other allergic rhinitis: Secondary | ICD-10-CM | POA: Diagnosis not present

## 2023-08-05 DIAGNOSIS — H3530 Unspecified macular degeneration: Secondary | ICD-10-CM | POA: Diagnosis not present

## 2023-08-05 DIAGNOSIS — G311 Senile degeneration of brain, not elsewhere classified: Secondary | ICD-10-CM | POA: Diagnosis not present

## 2023-08-05 DIAGNOSIS — H409 Unspecified glaucoma: Secondary | ICD-10-CM | POA: Diagnosis not present

## 2023-08-05 DIAGNOSIS — I519 Heart disease, unspecified: Secondary | ICD-10-CM | POA: Diagnosis not present

## 2023-08-07 DIAGNOSIS — J3089 Other allergic rhinitis: Secondary | ICD-10-CM | POA: Diagnosis not present

## 2023-08-07 DIAGNOSIS — I509 Heart failure, unspecified: Secondary | ICD-10-CM | POA: Diagnosis not present

## 2023-08-07 DIAGNOSIS — G311 Senile degeneration of brain, not elsewhere classified: Secondary | ICD-10-CM | POA: Diagnosis not present

## 2023-08-07 DIAGNOSIS — C9000 Multiple myeloma not having achieved remission: Secondary | ICD-10-CM | POA: Diagnosis not present

## 2023-08-07 DIAGNOSIS — K602 Anal fissure, unspecified: Secondary | ICD-10-CM | POA: Diagnosis not present

## 2023-08-07 DIAGNOSIS — J45909 Unspecified asthma, uncomplicated: Secondary | ICD-10-CM | POA: Diagnosis not present

## 2023-08-08 DIAGNOSIS — J3089 Other allergic rhinitis: Secondary | ICD-10-CM | POA: Diagnosis not present

## 2023-08-08 DIAGNOSIS — I509 Heart failure, unspecified: Secondary | ICD-10-CM | POA: Diagnosis not present

## 2023-08-08 DIAGNOSIS — C9000 Multiple myeloma not having achieved remission: Secondary | ICD-10-CM | POA: Diagnosis not present

## 2023-08-08 DIAGNOSIS — K602 Anal fissure, unspecified: Secondary | ICD-10-CM | POA: Diagnosis not present

## 2023-08-08 DIAGNOSIS — G311 Senile degeneration of brain, not elsewhere classified: Secondary | ICD-10-CM | POA: Diagnosis not present

## 2023-08-08 DIAGNOSIS — J45909 Unspecified asthma, uncomplicated: Secondary | ICD-10-CM | POA: Diagnosis not present

## 2023-08-09 DIAGNOSIS — J45909 Unspecified asthma, uncomplicated: Secondary | ICD-10-CM | POA: Diagnosis not present

## 2023-08-09 DIAGNOSIS — C9000 Multiple myeloma not having achieved remission: Secondary | ICD-10-CM | POA: Diagnosis not present

## 2023-08-09 DIAGNOSIS — J3089 Other allergic rhinitis: Secondary | ICD-10-CM | POA: Diagnosis not present

## 2023-08-09 DIAGNOSIS — I509 Heart failure, unspecified: Secondary | ICD-10-CM | POA: Diagnosis not present

## 2023-08-09 DIAGNOSIS — K602 Anal fissure, unspecified: Secondary | ICD-10-CM | POA: Diagnosis not present

## 2023-08-09 DIAGNOSIS — G311 Senile degeneration of brain, not elsewhere classified: Secondary | ICD-10-CM | POA: Diagnosis not present

## 2023-08-10 DIAGNOSIS — C9000 Multiple myeloma not having achieved remission: Secondary | ICD-10-CM | POA: Diagnosis not present

## 2023-08-10 DIAGNOSIS — J45909 Unspecified asthma, uncomplicated: Secondary | ICD-10-CM | POA: Diagnosis not present

## 2023-08-10 DIAGNOSIS — G311 Senile degeneration of brain, not elsewhere classified: Secondary | ICD-10-CM | POA: Diagnosis not present

## 2023-08-10 DIAGNOSIS — K602 Anal fissure, unspecified: Secondary | ICD-10-CM | POA: Diagnosis not present

## 2023-08-10 DIAGNOSIS — J3089 Other allergic rhinitis: Secondary | ICD-10-CM | POA: Diagnosis not present

## 2023-08-10 DIAGNOSIS — I509 Heart failure, unspecified: Secondary | ICD-10-CM | POA: Diagnosis not present

## 2023-08-11 DIAGNOSIS — K602 Anal fissure, unspecified: Secondary | ICD-10-CM | POA: Diagnosis not present

## 2023-08-11 DIAGNOSIS — C9000 Multiple myeloma not having achieved remission: Secondary | ICD-10-CM | POA: Diagnosis not present

## 2023-08-11 DIAGNOSIS — I509 Heart failure, unspecified: Secondary | ICD-10-CM | POA: Diagnosis not present

## 2023-08-11 DIAGNOSIS — G311 Senile degeneration of brain, not elsewhere classified: Secondary | ICD-10-CM | POA: Diagnosis not present

## 2023-08-11 DIAGNOSIS — J3089 Other allergic rhinitis: Secondary | ICD-10-CM | POA: Diagnosis not present

## 2023-08-11 DIAGNOSIS — J45909 Unspecified asthma, uncomplicated: Secondary | ICD-10-CM | POA: Diagnosis not present

## 2023-09-04 DEATH — deceased

## 2023-12-26 ENCOUNTER — Encounter: Payer: Self-pay | Admitting: Radiology

## 2024-01-07 ENCOUNTER — Other Ambulatory Visit: Payer: Self-pay | Admitting: *Deleted

## 2024-02-27 ENCOUNTER — Ambulatory Visit: Payer: Medicare Other | Admitting: Urology
